# Patient Record
Sex: Male | Born: 1960
Health system: Southern US, Community
[De-identification: ages and names within clinical notes are randomized; demographics above are authoritative.]

## PROBLEM LIST (undated history)

## (undated) DIAGNOSIS — Z7901 Long term (current) use of anticoagulants: Secondary | ICD-10-CM

## (undated) DIAGNOSIS — Q269 Congenital malformation of great vein, unspecified: Secondary | ICD-10-CM

## (undated) DIAGNOSIS — I2699 Other pulmonary embolism without acute cor pulmonale: Secondary | ICD-10-CM

## (undated) DIAGNOSIS — D689 Coagulation defect, unspecified: Secondary | ICD-10-CM

## (undated) DIAGNOSIS — I82220 Acute embolism and thrombosis of inferior vena cava: Secondary | ICD-10-CM

## (undated) DIAGNOSIS — I82409 Acute embolism and thrombosis of unspecified deep veins of unspecified lower extremity: Secondary | ICD-10-CM

## (undated) HISTORY — DX: Acute embolism and thrombosis of inferior vena cava: I82.220

## (undated) HISTORY — DX: Long term (current) use of anticoagulants: Z79.01

## (undated) HISTORY — DX: Acute embolism and thrombosis of unspecified deep veins of unspecified lower extremity: I82.409

## (undated) HISTORY — DX: Coagulation defect, unspecified: D68.9

## (undated) HISTORY — DX: Other pulmonary embolism without acute cor pulmonale: I26.99

## (undated) HISTORY — DX: Congenital malformation of great vein, unspecified: Q26.9

---

## 2001-02-26 ENCOUNTER — Emergency Department (HOSPITAL_COMMUNITY): Admission: EM | Admit: 2001-02-26 | Discharge: 2001-02-26 | Payer: Self-pay | Admitting: Emergency Medicine

## 2002-04-01 ENCOUNTER — Emergency Department (HOSPITAL_COMMUNITY): Admission: EM | Admit: 2002-04-01 | Discharge: 2002-04-01 | Payer: Self-pay | Admitting: Emergency Medicine

## 2003-04-25 ENCOUNTER — Emergency Department (HOSPITAL_COMMUNITY): Admission: EM | Admit: 2003-04-25 | Discharge: 2003-04-25 | Payer: Self-pay | Admitting: Emergency Medicine

## 2003-07-22 ENCOUNTER — Encounter: Payer: Self-pay | Admitting: *Deleted

## 2003-07-22 ENCOUNTER — Emergency Department (HOSPITAL_COMMUNITY): Admission: EM | Admit: 2003-07-22 | Discharge: 2003-07-22 | Payer: Self-pay | Admitting: Emergency Medicine

## 2003-07-23 ENCOUNTER — Ambulatory Visit (HOSPITAL_COMMUNITY): Admission: RE | Admit: 2003-07-23 | Discharge: 2003-07-23 | Payer: Self-pay | Admitting: *Deleted

## 2003-07-23 ENCOUNTER — Encounter: Payer: Self-pay | Admitting: *Deleted

## 2003-07-23 ENCOUNTER — Encounter: Payer: Self-pay | Admitting: Emergency Medicine

## 2003-07-23 ENCOUNTER — Inpatient Hospital Stay (HOSPITAL_COMMUNITY): Admission: EM | Admit: 2003-07-23 | Discharge: 2003-07-30 | Payer: Self-pay | Admitting: Emergency Medicine

## 2003-07-24 ENCOUNTER — Encounter: Payer: Self-pay | Admitting: Family Medicine

## 2003-07-31 ENCOUNTER — Encounter (HOSPITAL_COMMUNITY): Admission: RE | Admit: 2003-07-31 | Discharge: 2003-08-03 | Payer: Self-pay | Admitting: Internal Medicine

## 2003-08-03 ENCOUNTER — Encounter (HOSPITAL_COMMUNITY): Admission: RE | Admit: 2003-08-03 | Discharge: 2003-09-02 | Payer: Self-pay | Admitting: Oncology

## 2003-08-03 ENCOUNTER — Encounter: Admission: RE | Admit: 2003-08-03 | Discharge: 2003-08-03 | Payer: Self-pay | Admitting: Oncology

## 2003-09-10 ENCOUNTER — Encounter: Admission: RE | Admit: 2003-09-10 | Discharge: 2003-09-10 | Payer: Self-pay | Admitting: Oncology

## 2003-09-10 ENCOUNTER — Encounter (HOSPITAL_COMMUNITY): Admission: RE | Admit: 2003-09-10 | Discharge: 2003-10-10 | Payer: Self-pay | Admitting: Oncology

## 2003-10-22 ENCOUNTER — Encounter (HOSPITAL_COMMUNITY): Admission: RE | Admit: 2003-10-22 | Discharge: 2003-11-21 | Payer: Self-pay | Admitting: Oncology

## 2003-10-22 ENCOUNTER — Encounter: Admission: RE | Admit: 2003-10-22 | Discharge: 2003-10-22 | Payer: Self-pay | Admitting: Oncology

## 2007-06-06 ENCOUNTER — Emergency Department (HOSPITAL_COMMUNITY): Admission: EM | Admit: 2007-06-06 | Discharge: 2007-06-06 | Payer: Self-pay | Admitting: Emergency Medicine

## 2008-11-27 ENCOUNTER — Inpatient Hospital Stay (HOSPITAL_COMMUNITY): Admission: EM | Admit: 2008-11-27 | Discharge: 2008-11-30 | Payer: Self-pay | Admitting: Emergency Medicine

## 2008-11-28 IMAGING — US US EXTREM LOW VENOUS BILAT
1 series · 13 of 24 positions shown · non-contrast
Comparison: None available

CLINICAL DATA: Left leg swelling and cellulitis, past history of
DVT and varicose veins

VENOUS DUPLEX ULTRASOUND OF BILATERAL LOWER EXTREMITIES
TECHNIQUE: Gray-scale sonography with graded compression, as well
as color Doppler and duplex ultrasound, were performed to evaluate
the deep venous system of both lower extremities from the level of
the common femoral vein through the popliteal and proximal calf
veins. Spectral Doppler was utilized to evaulate flow at rest and
with distal augmentation maneuvers.

[Series 1: unknown · 13 of 66 slices shown]
[im 1/66]
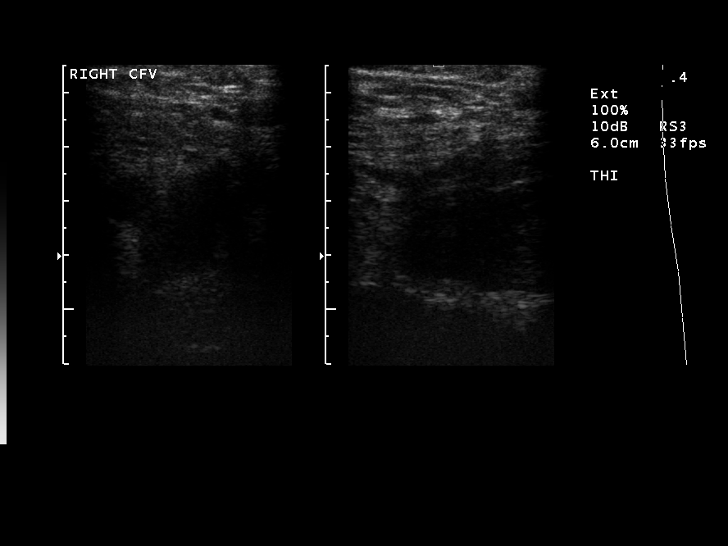
[im 6/66]
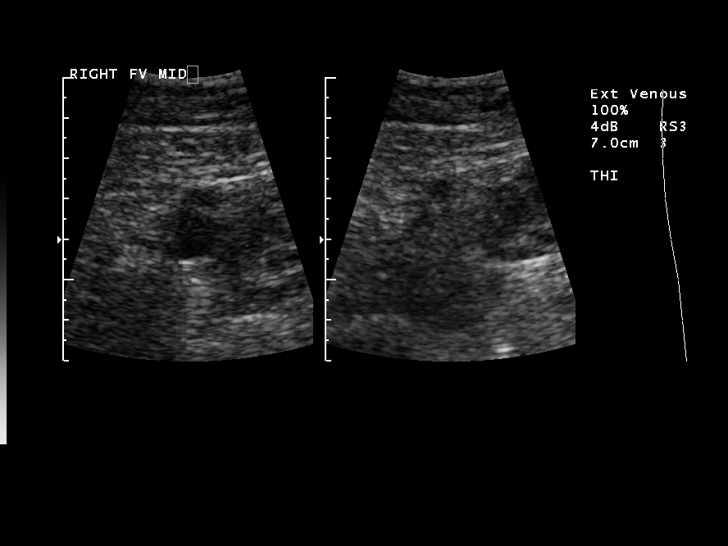
[im 12/66]
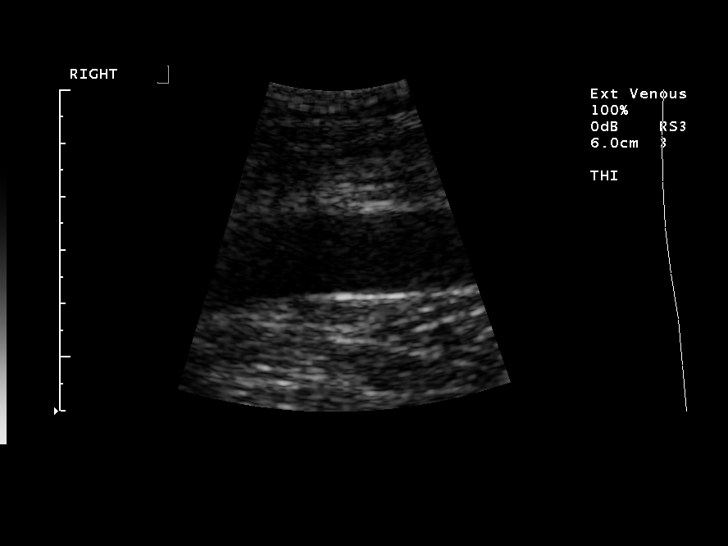
[im 17/66]
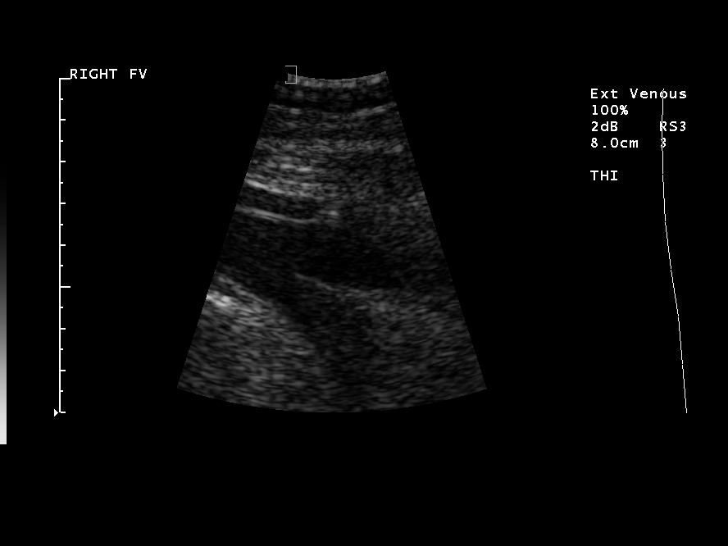
[im 23/66]
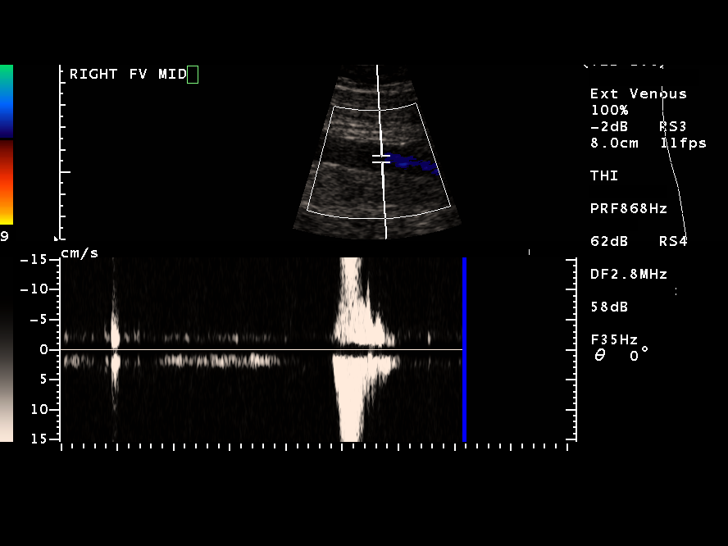
[im 29/66]
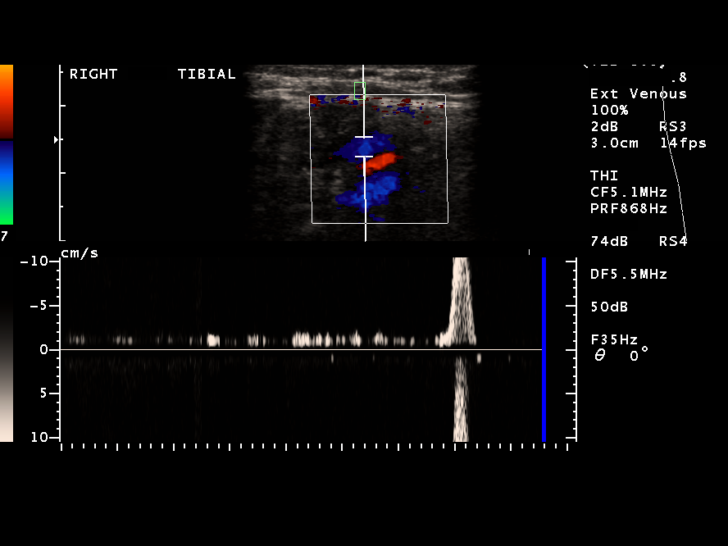
[im 34/66]
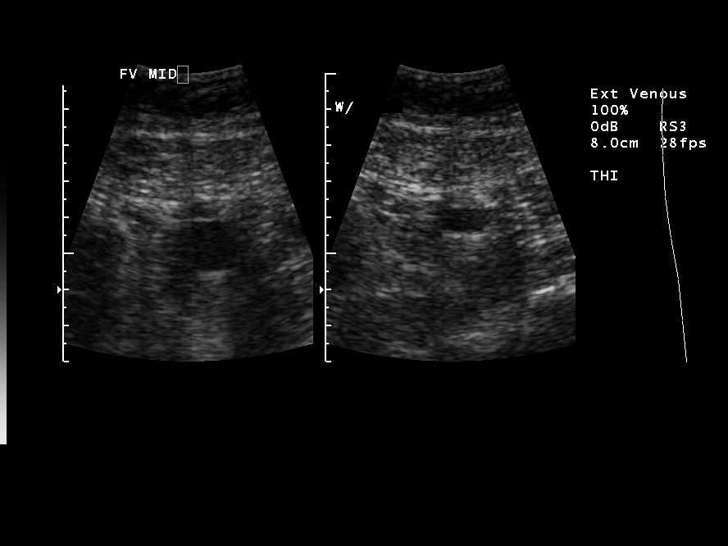
[im 37/66]
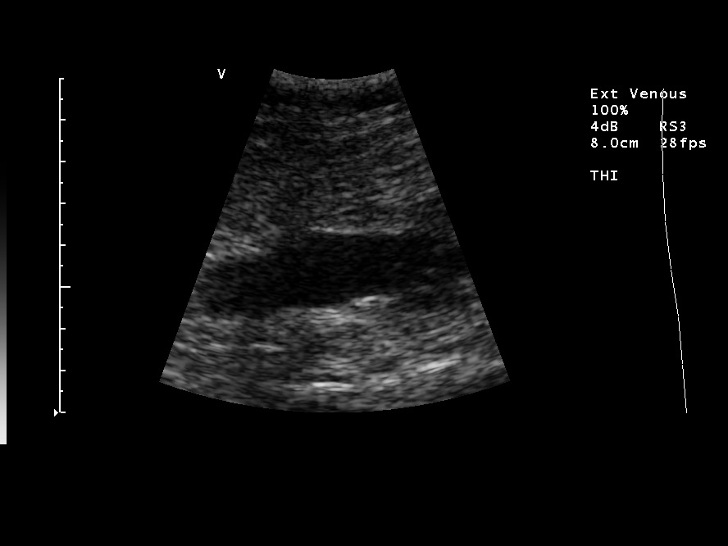
[im 43/66]
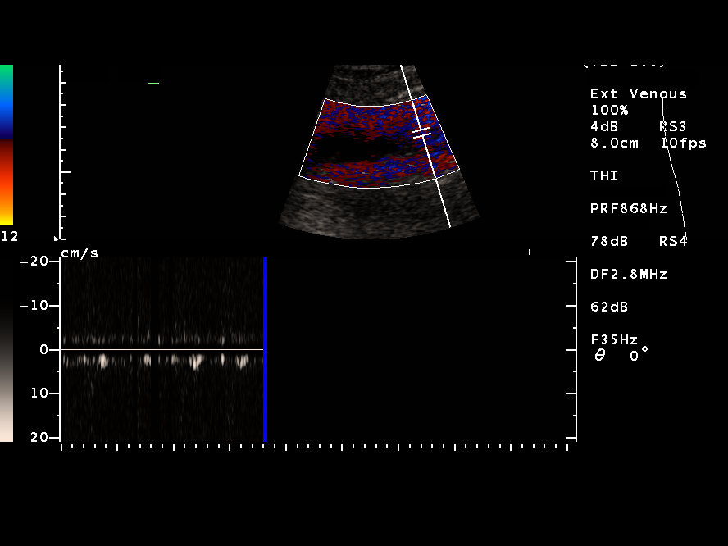
[im 49/66]
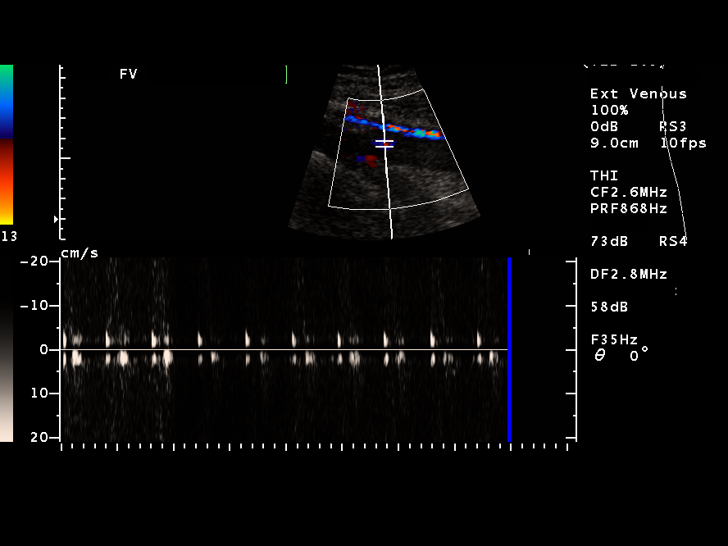
[im 54/66]
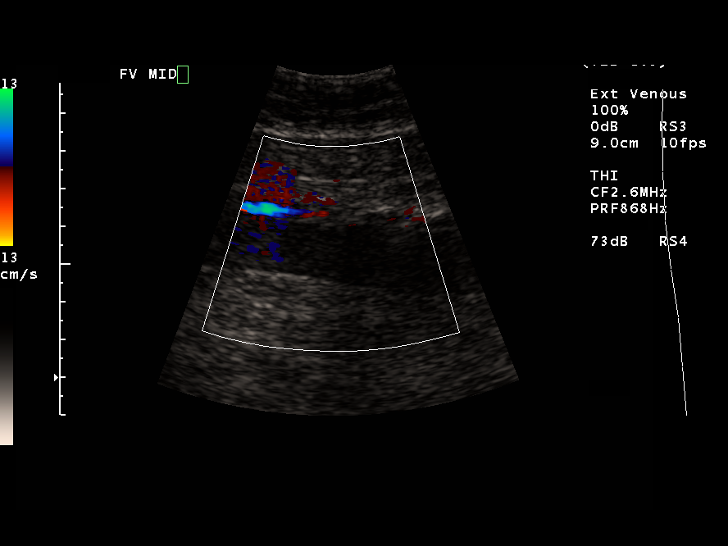
[im 60/66]
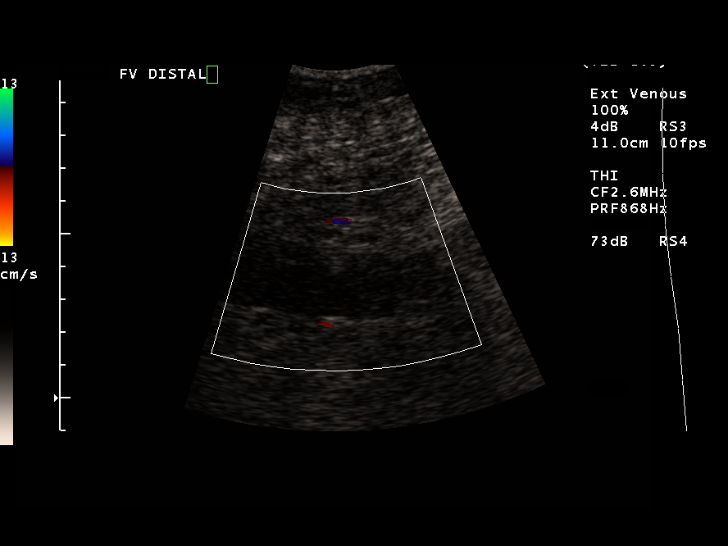
[im 66/66]
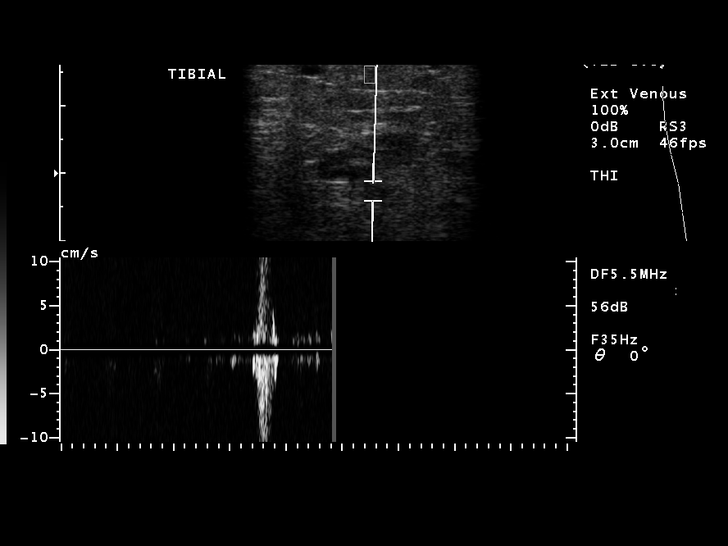

[13 of 24 positions shown; findings below may reference images not displayed]

FINDINGS: Left lower extremity:
Hypoechoic thrombus identified in deep venous system of left lower
extremity from common femoral through popliteal veins.
Observed segments demonstrate markedly diminished flow and
noncompressibility.
Augmentation maneuvers in left lower extremity not performed.
Clot also identified in left greater saphenous vein.

Right lower extremity:
Deep venous system patent and compressible from right groin through
popliteal fossa.
Spontaneous venous flow present with intact augmentation and
evidence of respiratory phasicity.
No intraluminal thrombus identified in right lower extremity.
Visualized portion of the greater saphenous system unremarkable.
IMPRESSION: Acute deep venous thrombosis in left lower extremity, from common
femoral through popliteal veins.
No evidence of deep venous thrombosis in right lower extremity.

Critical test results telephoned to Dr. CRECENCIANO at the time of
interpretation on [DATE] at [VK] hours.

## 2008-12-06 ENCOUNTER — Emergency Department (HOSPITAL_COMMUNITY): Admission: EM | Admit: 2008-12-06 | Discharge: 2008-12-06 | Payer: Self-pay | Admitting: Emergency Medicine

## 2008-12-09 ENCOUNTER — Ambulatory Visit: Payer: Self-pay | Admitting: *Deleted

## 2008-12-09 ENCOUNTER — Encounter (INDEPENDENT_AMBULATORY_CARE_PROVIDER_SITE_OTHER): Payer: Self-pay | Admitting: Emergency Medicine

## 2008-12-09 ENCOUNTER — Inpatient Hospital Stay (HOSPITAL_COMMUNITY): Admission: EM | Admit: 2008-12-09 | Discharge: 2008-12-12 | Payer: Self-pay | Admitting: Emergency Medicine

## 2009-12-02 ENCOUNTER — Ambulatory Visit (HOSPITAL_COMMUNITY): Admission: RE | Admit: 2009-12-02 | Discharge: 2009-12-02 | Payer: Self-pay | Admitting: Internal Medicine

## 2009-12-02 ENCOUNTER — Emergency Department (HOSPITAL_COMMUNITY): Admission: EM | Admit: 2009-12-02 | Discharge: 2009-12-02 | Payer: Self-pay | Admitting: Emergency Medicine

## 2009-12-02 IMAGING — US US EXTREM LOW VENOUS*L*
1 series · 14 of 24 positions shown · non-contrast
Comparison: [DATE]

CLINICAL DATA: Lower extremity edema with history of DVT.  No
longer on Coumadin.  Question DVT.



[Series 1: us extrem low venous*left* · 0.11mm/px · 14 of 33 slices shown]
[im 1/33]
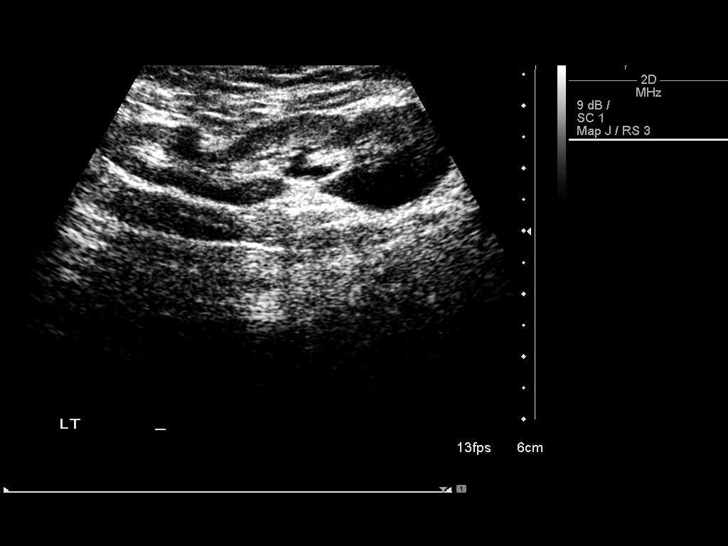
[im 3/33]
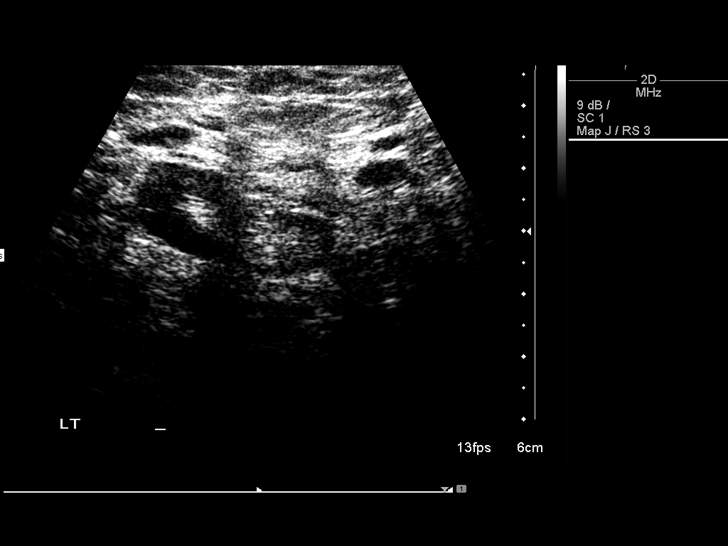
[im 6/33]
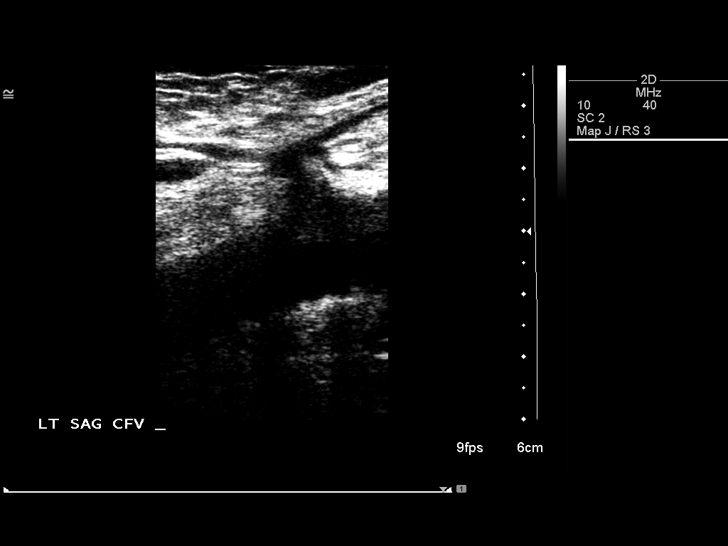
[im 9/33]
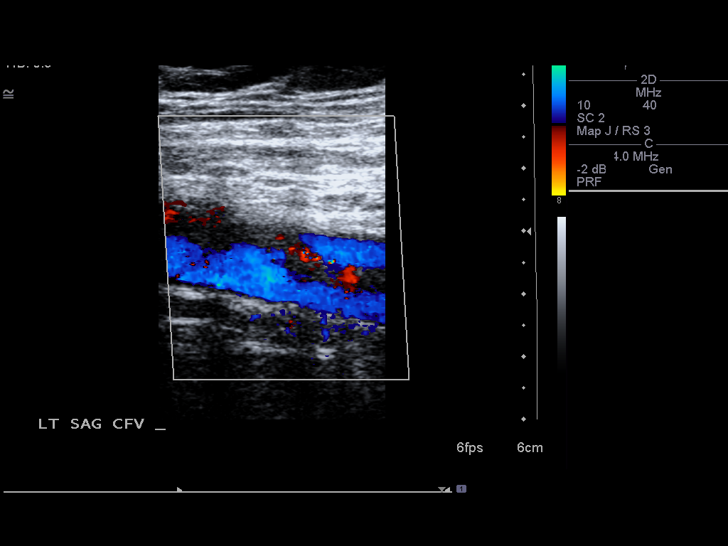
[im 10/33]
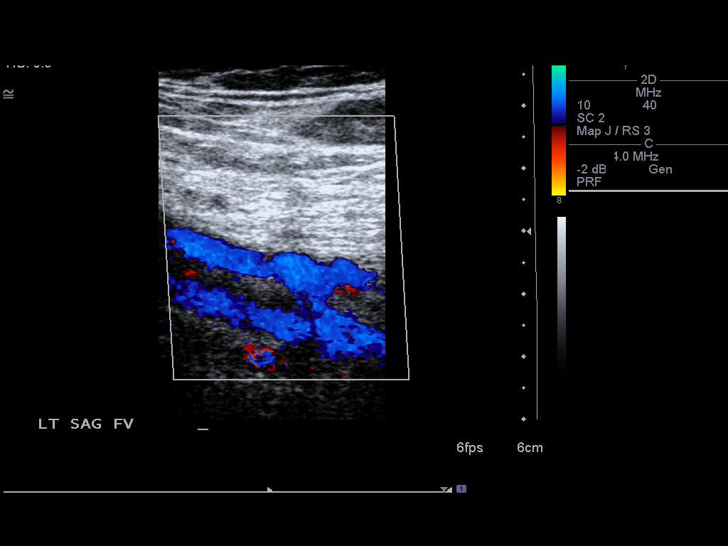
[im 13/33]
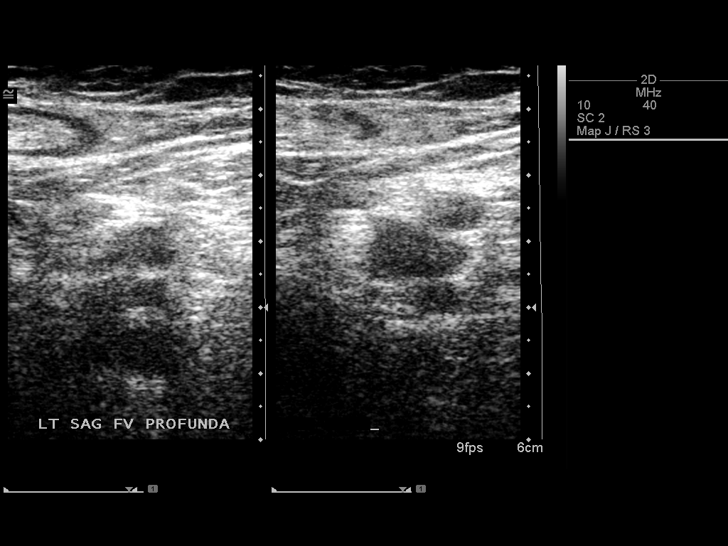
[im 16/33]
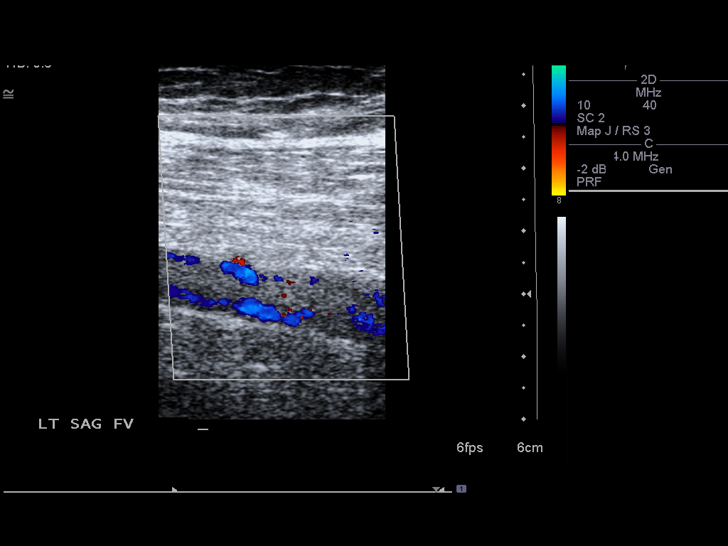
[im 17/33]
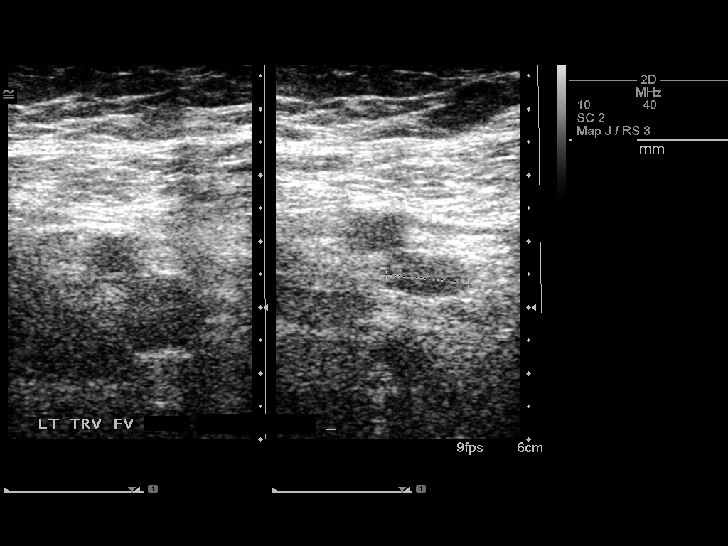
[im 20/33]
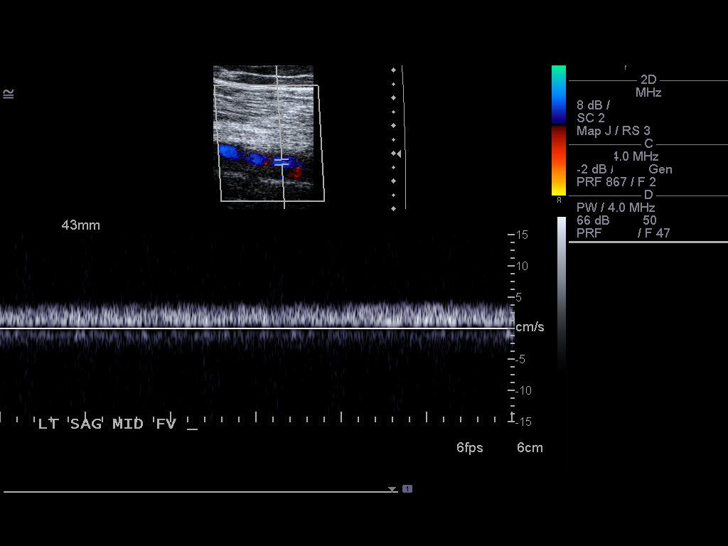
[im 23/33]
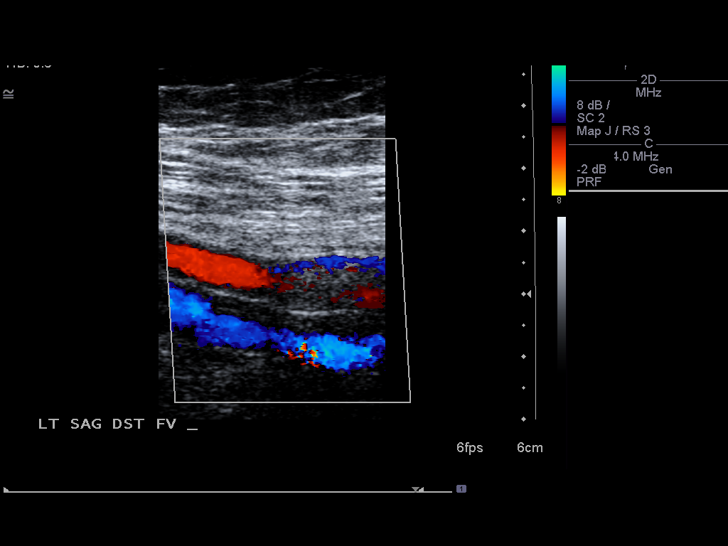
[im 26/33]
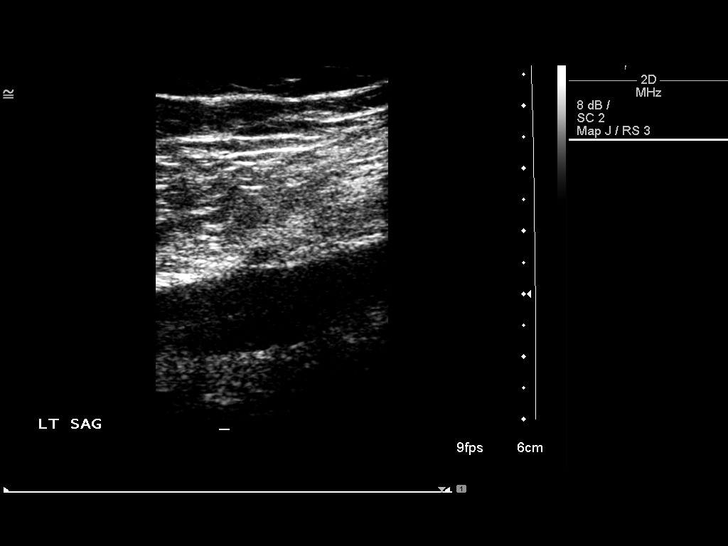
[im 27/33]
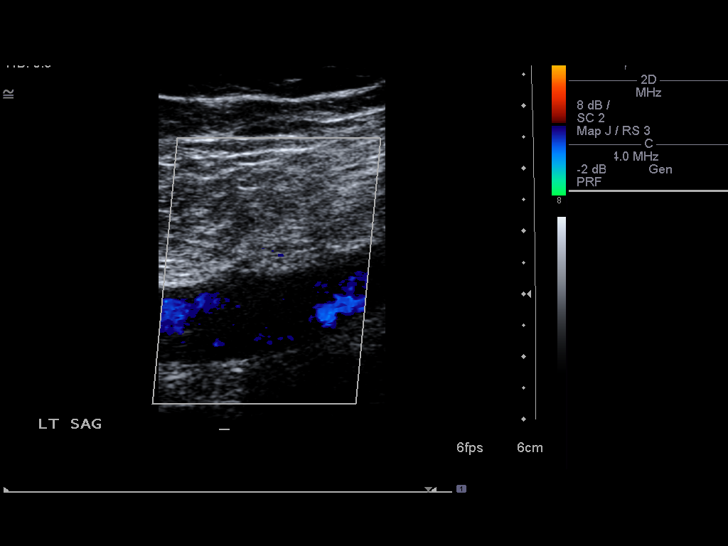
[im 30/33]
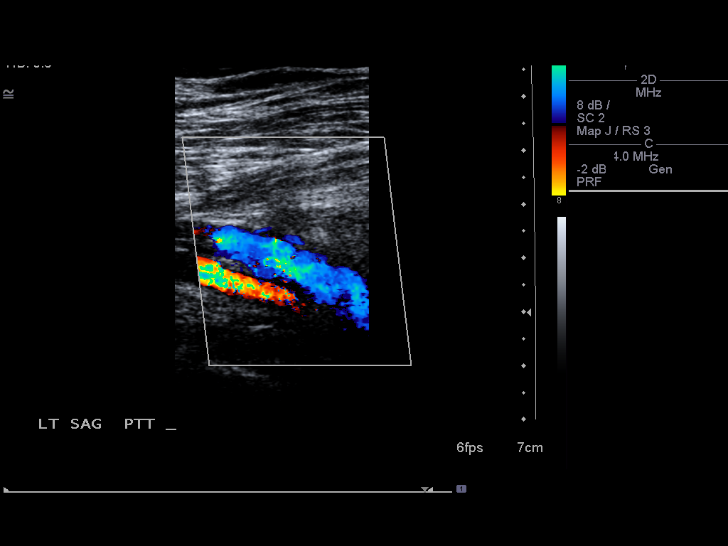
[im 33/33]
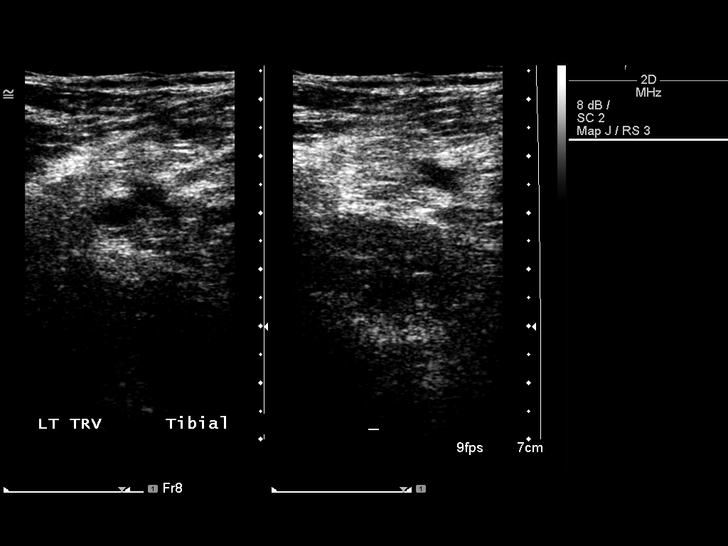

[14 of 24 positions shown; findings below may reference images not displayed]

FINDINGS: There is decreased compressibility involving the left
common femoral, profunda femoral, femoral and popliteal veins.
Lack of respiratory phasicity is seen in association.  Augmentation
was not performed.  Saphenous system is patent.
IMPRESSION: Left lower extremity deep venous thrombosis extending from the
common femoral vein to the popliteal vein.

## 2010-08-13 ENCOUNTER — Encounter (HOSPITAL_COMMUNITY): Admission: RE | Admit: 2010-08-13 | Discharge: 2010-08-29 | Payer: Self-pay | Admitting: Internal Medicine

## 2010-09-02 ENCOUNTER — Encounter (HOSPITAL_COMMUNITY): Admission: RE | Admit: 2010-09-02 | Discharge: 2010-10-02 | Payer: Self-pay | Admitting: Internal Medicine

## 2010-10-08 ENCOUNTER — Encounter (HOSPITAL_COMMUNITY)
Admission: RE | Admit: 2010-10-08 | Discharge: 2010-11-07 | Payer: Self-pay | Source: Home / Self Care | Attending: Internal Medicine | Admitting: Internal Medicine

## 2011-02-15 LAB — DIFFERENTIAL
Basophils Absolute: 0 10*3/uL (ref 0.0–0.1)
Lymphocytes Relative: 35 % (ref 12–46)
Lymphs Abs: 1.9 10*3/uL (ref 0.7–4.0)
Monocytes Absolute: 0.7 10*3/uL (ref 0.1–1.0)
Neutro Abs: 2.8 10*3/uL (ref 1.7–7.7)

## 2011-02-15 LAB — CBC
HCT: 51.7 % (ref 39.0–52.0)
Hemoglobin: 17.4 g/dL — ABNORMAL HIGH (ref 13.0–17.0)
RBC: 5.74 MIL/uL (ref 4.22–5.81)
RDW: 13.6 % (ref 11.5–15.5)
WBC: 5.5 10*3/uL (ref 4.0–10.5)

## 2011-02-15 LAB — BASIC METABOLIC PANEL
BUN: 8 mg/dL (ref 6–23)
Chloride: 100 mEq/L (ref 96–112)
GFR calc non Af Amer: >60 mL/min (ref >60)
Potassium: 3.8 mEq/L (ref 3.5–5.1)
Sodium: 136 mEq/L (ref 135–145)

## 2011-03-16 LAB — COMPREHENSIVE METABOLIC PANEL
AST: 20 U/L (ref 0–37)
Albumin: 3.4 g/dL — ABNORMAL LOW (ref 3.5–5.2)
Alkaline Phosphatase: 67 U/L (ref 39–117)
Chloride: 101 mEq/L (ref 96–112)
Creatinine, Ser: 1.05 mg/dL (ref 0.4–1.5)
Potassium: 3.3 mEq/L — ABNORMAL LOW (ref 3.5–5.1)
Sodium: 136 mEq/L (ref 135–145)
Total Bilirubin: 1.3 mg/dL — ABNORMAL HIGH (ref 0.3–1.2)

## 2011-03-16 LAB — DIFFERENTIAL
Basophils Absolute: 0 10*3/uL (ref 0.0–0.1)
Basophils Relative: 1 % (ref 0–1)
Eosinophils Absolute: 0 10*3/uL (ref 0.0–0.7)
Lymphs Abs: 1.1 10*3/uL (ref 0.7–4.0)
Monocytes Absolute: 0.8 10*3/uL (ref 0.1–1.0)
Monocytes Relative: 10 % (ref 3–12)
Neutro Abs: 2.5 10*3/uL (ref 1.7–7.7)
Neutro Abs: 6.6 10*3/uL (ref 1.7–7.7)
Neutrophils Relative %: 44 % (ref 43–77)
Neutrophils Relative %: 77 % (ref 43–77)

## 2011-03-16 LAB — RETICULOCYTES
RBC.: 5.46 MIL/uL (ref 4.22–5.81)
Retic Count, Absolute: 54.6 10*3/uL (ref 19.0–186.0)

## 2011-03-16 LAB — CBC
HCT: 46.2 % (ref 39.0–52.0)
Hemoglobin: 15.5 g/dL (ref 13.0–17.0)
Hemoglobin: 17.1 g/dL — ABNORMAL HIGH (ref 13.0–17.0)
MCHC: 33.6 g/dL (ref 30.0–36.0)
MCV: 87.5 fL (ref 78.0–100.0)
MCV: 88.5 fL (ref 78.0–100.0)
Platelets: 177 10*3/uL (ref 150–400)
RBC: 5.28 MIL/uL (ref 4.22–5.81)
RBC: 5.49 MIL/uL (ref 4.22–5.81)
RBC: 5.84 MIL/uL — ABNORMAL HIGH (ref 4.22–5.81)
RDW: 13.4 % (ref 11.5–15.5)
WBC: 7.4 10*3/uL (ref 4.0–10.5)
WBC: 8.6 10*3/uL (ref 4.0–10.5)

## 2011-03-16 LAB — PROTIME-INR
INR: 1 (ref 0.00–1.49)
INR: 1.8 — ABNORMAL HIGH (ref 0.00–1.49)
INR: 1.8 — ABNORMAL HIGH (ref 0.00–1.49)
INR: 2 — ABNORMAL HIGH (ref 0.00–1.49)
Prothrombin Time: 13.3 seconds (ref 11.6–15.2)
Prothrombin Time: 22 seconds — ABNORMAL HIGH (ref 11.6–15.2)

## 2011-03-16 LAB — URINALYSIS, ROUTINE W REFLEX MICROSCOPIC
Bilirubin Urine: NEGATIVE
Nitrite: NEGATIVE
Specific Gravity, Urine: 1.017 (ref 1.005–1.030)
pH: 5.5 (ref 5.0–8.0)

## 2011-03-16 LAB — BASIC METABOLIC PANEL
BUN: 10 mg/dL (ref 6–23)
Calcium: 9.1 mg/dL (ref 8.4–10.5)
Creatinine, Ser: 1.02 mg/dL (ref 0.4–1.5)
GFR calc non Af Amer: >60 mL/min (ref >60)
Glucose, Bld: 91 mg/dL (ref 70–99)
Sodium: 138 mEq/L (ref 135–145)

## 2011-03-16 LAB — POCT I-STAT, CHEM 8
BUN: 8 mg/dL (ref 6–23)
Chloride: 104 mEq/L (ref 96–112)
Sodium: 139 mEq/L (ref 135–145)

## 2011-03-16 LAB — APTT

## 2011-03-17 ENCOUNTER — Emergency Department (HOSPITAL_COMMUNITY): Payer: Self-pay

## 2011-03-17 ENCOUNTER — Encounter (HOSPITAL_COMMUNITY): Payer: Self-pay | Admitting: Radiology

## 2011-03-17 ENCOUNTER — Inpatient Hospital Stay (HOSPITAL_COMMUNITY)
Admission: EM | Admit: 2011-03-17 | Discharge: 2011-03-20 | DRG: 299 | Disposition: A | Discharge status: Home / Self Care | Payer: Self-pay | Attending: Otolaryngology | Admitting: Otolaryngology

## 2011-03-17 ENCOUNTER — Inpatient Hospital Stay (HOSPITAL_COMMUNITY): Payer: Self-pay

## 2011-03-17 DIAGNOSIS — I2699 Other pulmonary embolism without acute cor pulmonale: Secondary | ICD-10-CM | POA: Diagnosis present

## 2011-03-17 DIAGNOSIS — I82220 Acute embolism and thrombosis of inferior vena cava: Secondary | ICD-10-CM | POA: Diagnosis present

## 2011-03-17 DIAGNOSIS — I824Y9 Acute embolism and thrombosis of unspecified deep veins of unspecified proximal lower extremity: Principal | ICD-10-CM | POA: Diagnosis present

## 2011-03-17 DIAGNOSIS — E039 Hypothyroidism, unspecified: Secondary | ICD-10-CM | POA: Diagnosis present

## 2011-03-17 LAB — CBC
MCH: 30.5 pg (ref 26.0–34.0)
MCHC: 34.8 g/dL (ref 30.0–36.0)
MCV: 87.8 fL (ref 78.0–100.0)
Platelets: 122 10*3/uL — ABNORMAL LOW (ref 150–400)
RBC: 5.57 MIL/uL (ref 4.22–5.81)
RDW: 14.1 % (ref 11.5–15.5)

## 2011-03-17 LAB — DIFFERENTIAL
Basophils Relative: 0 % (ref 0–1)
Eosinophils Absolute: 0 10*3/uL (ref 0.0–0.7)
Eosinophils Relative: 0 % (ref 0–5)
Lymphs Abs: 1.1 10*3/uL (ref 0.7–4.0)
Monocytes Absolute: 0.7 10*3/uL (ref 0.1–1.0)
Monocytes Relative: 8 % (ref 3–12)

## 2011-03-17 LAB — BASIC METABOLIC PANEL
BUN: 11 mg/dL (ref 6–23)
Calcium: 9.4 mg/dL (ref 8.4–10.5)
Chloride: 101 mEq/L (ref 96–112)
Creatinine, Ser: 1.16 mg/dL (ref 0.4–1.5)

## 2011-03-17 LAB — PROTIME-INR: Prothrombin Time: 13.9 seconds (ref 11.6–15.2)

## 2011-03-17 IMAGING — US US EXTREM LOW VENOUS*R*
1 series · 13 of 24 positions shown · non-contrast
Comparison: None

CLINICAL DATA: Swelling right lower extremity, pain, history DVT

RIGHT LOWER EXTREMITY VENOUS DUPLEX ULTRASOUND
TECHNIQUE: Gray-scale sonography with graded compression, as well
as color Doppler and duplex ultrasound, were performed to evaluate
the deep venous system of the lower extremity from the level of the
common femoral vein through the popliteal and proximal calf veins.
Spectral Doppler was utilized to evaluate flow at rest and with
distal augmentation maneuvers.

[Series 1: us extrem low venous*right* · 0.11mm/px · 13 of 28 slices shown]
[im 1/28]
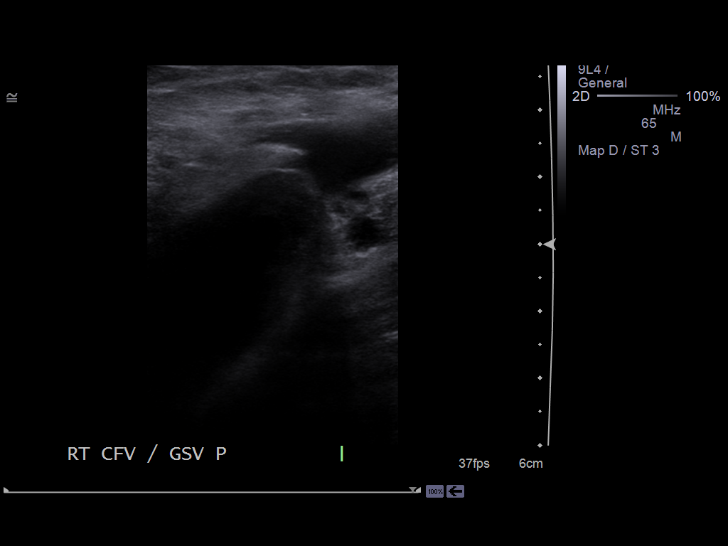
[im 3/28]
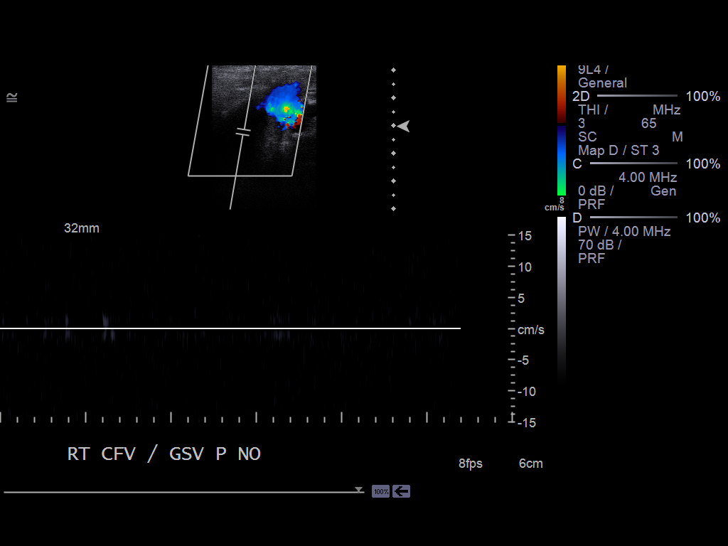
[im 5/28]
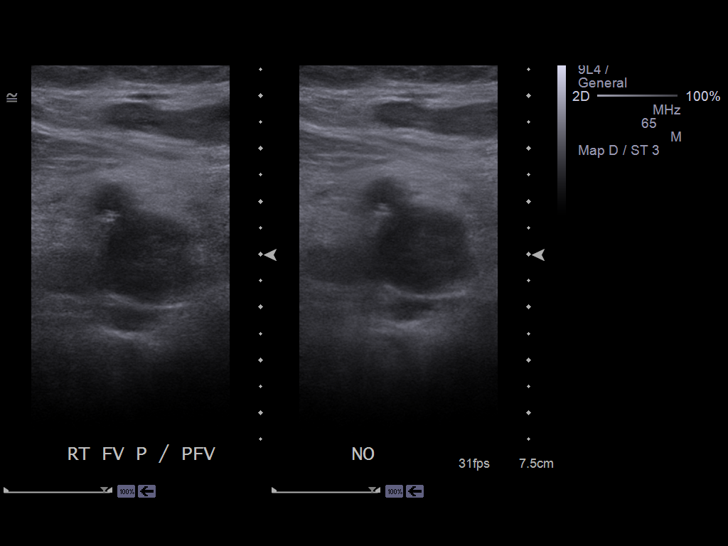
[im 8/28]
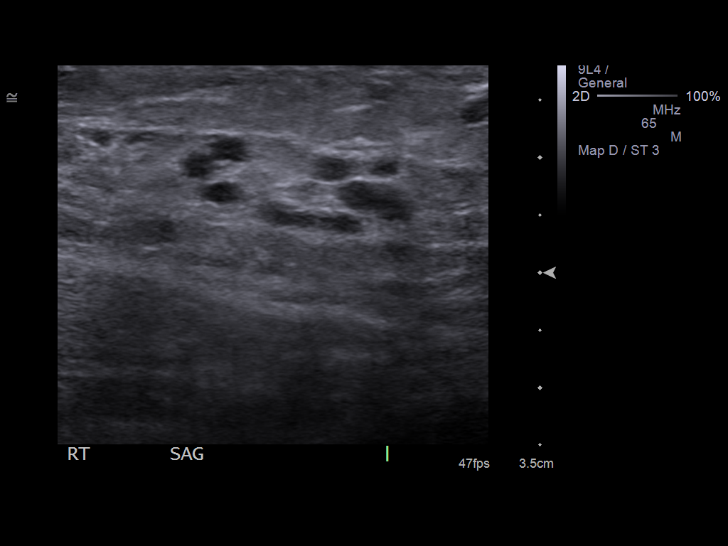
[im 10/28]
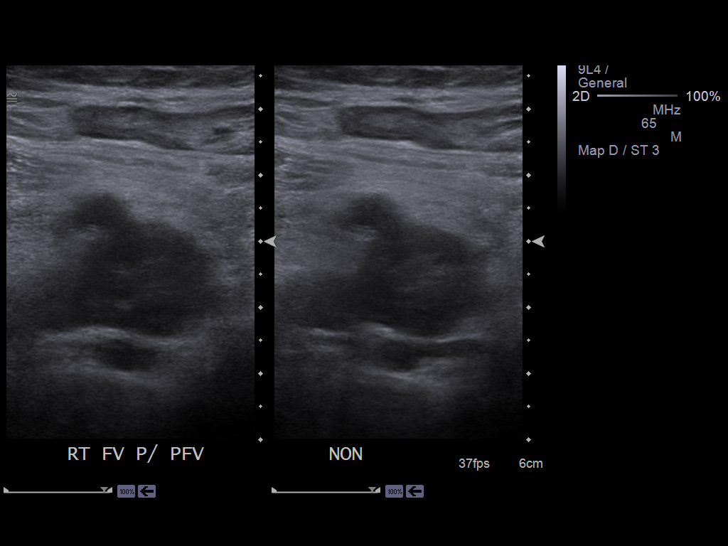
[im 12/28]
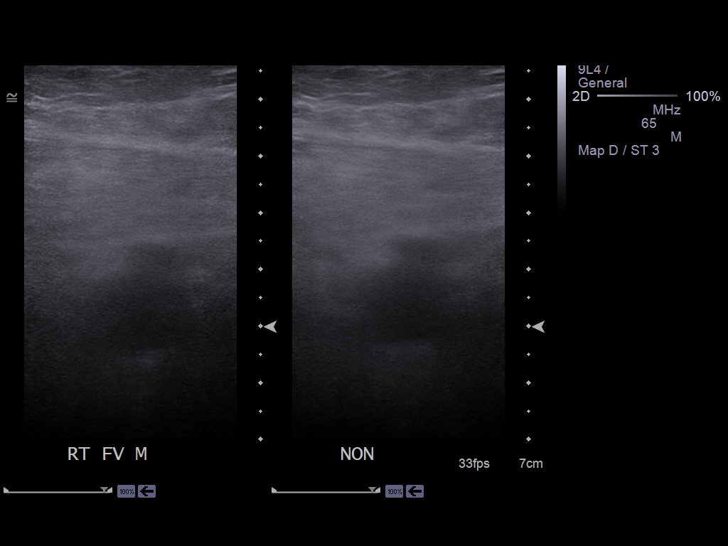
[im 15/28]
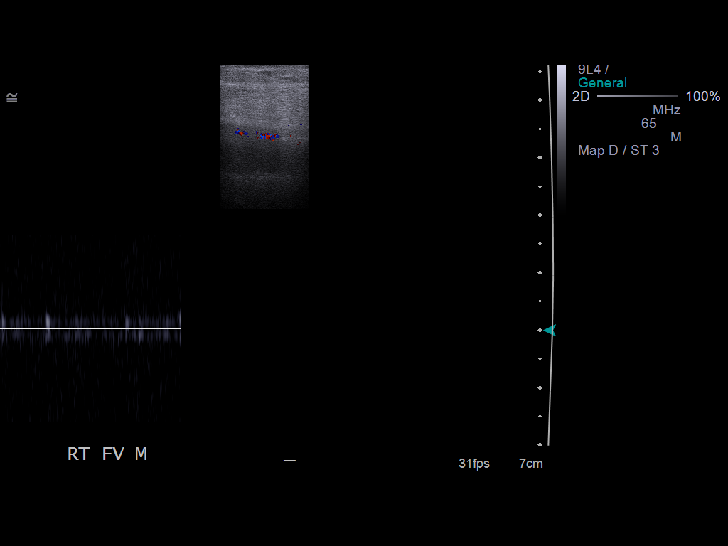
[im 16/28]
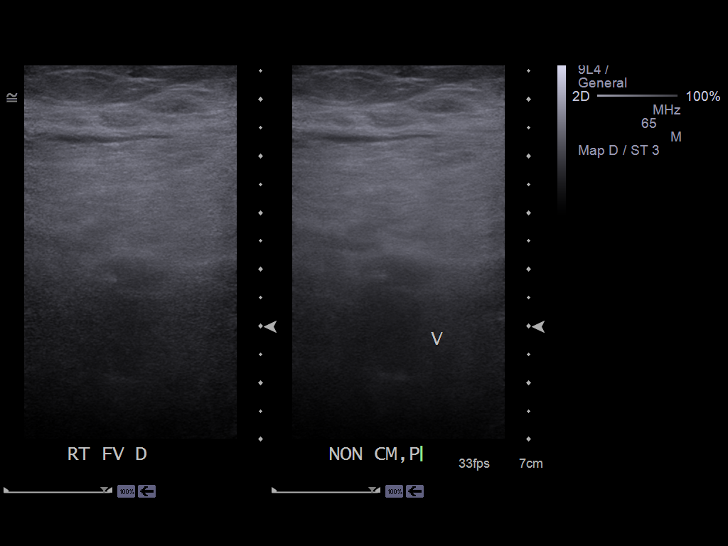
[im 18/28]
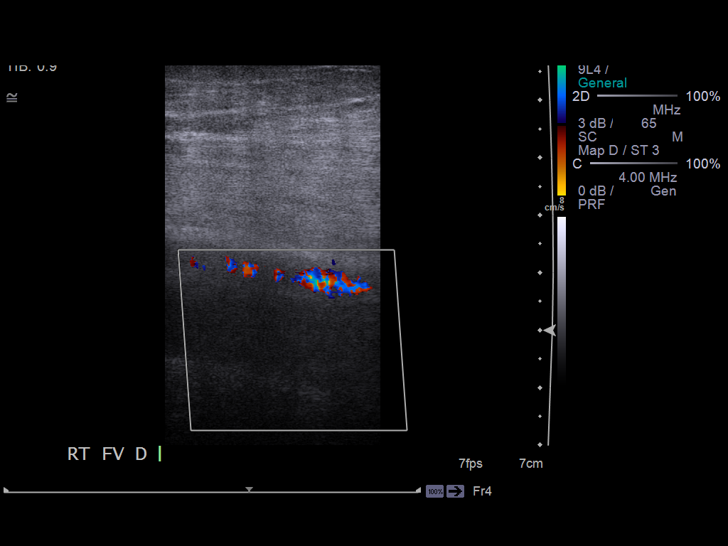
[im 20/28]
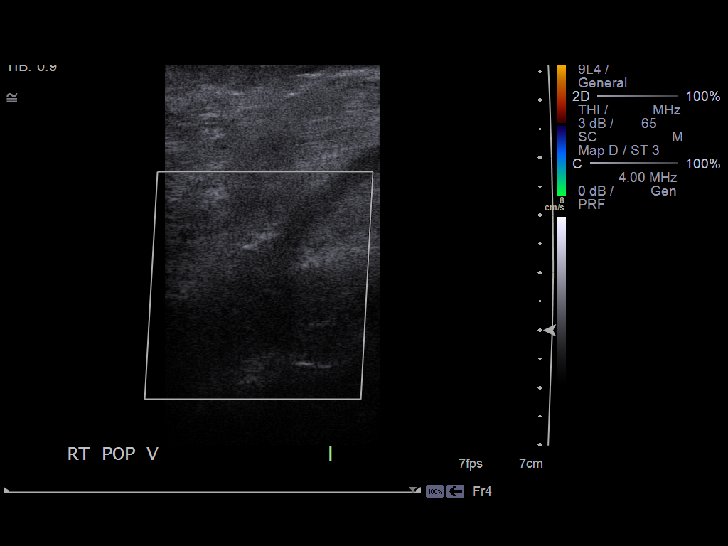
[im 23/28]
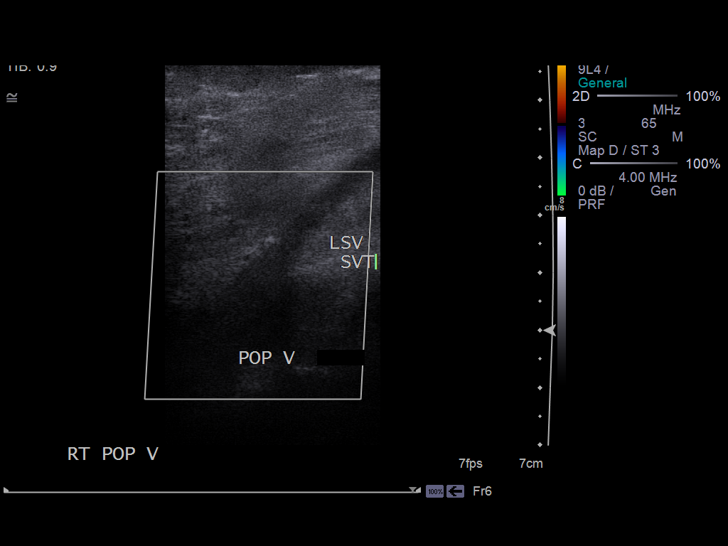
[im 25/28]
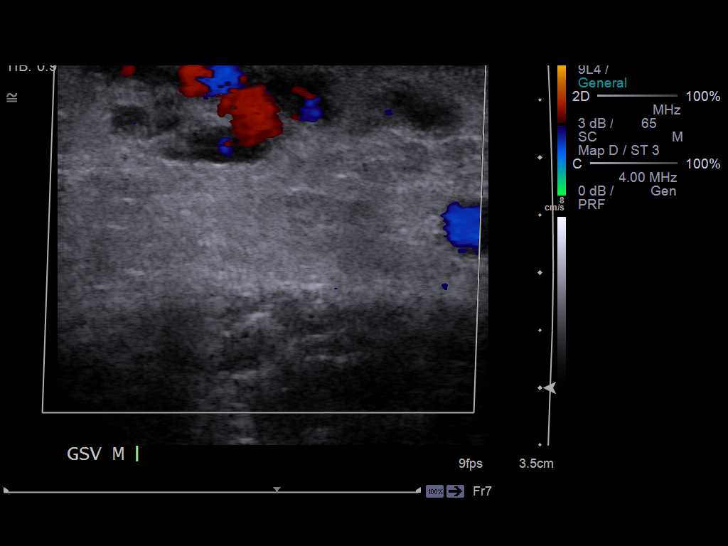
[im 28/28]
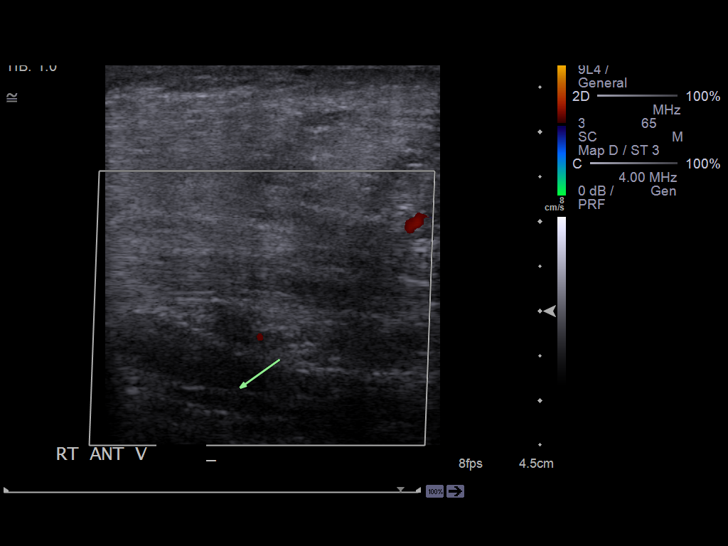

[13 of 24 positions shown; findings below may reference images not displayed]

FINDINGS: Extensive thrombus is identified throughout the deep venous system
of the right lower extremity, extending from the right common
femoral vein through the right popliteal vein.  Involved segments
demonstrate hypoechoic internal thrombus, are mildly distended, and
are noncompressible. Only very minimal spontaneous venous flow is
seen at scattered sites throughout the deep venous system of the
right lower extremity.  Augmentation maneuvers not performed.

Numerous superficial venous varicosities are identified throughout
the right lower extremity, some which demonstrate age indeterminate
thrombus. Additionally, a large varix is seen extending through a
minimally prominent lymph node in the right inguinal region.
IMPRESSION: Extensive deep venous thrombosis throughout the right lower
extremity.
Numerous venous varicosities, some which contain age indeterminate
thrombus.
Incidentally noted large varicosity traversing the hilum of a lymph
node at the right inguinal region.

Critical test results telephoned to Dr. JOSE LUIS at the time of
interpretation on [DATE] at [CT] hours.

## 2011-03-17 IMAGING — CT CT ABD-PELV W/ CM
2 of 5 series · 15 of 46 positions shown, 17 images · IV contrast (Omnipaque 300)
Comparison: Prior lower extremity ultrasound of [DATE] and
[DATE].  No prior CTs.  Report of abdominal pelvic CT of
[DATE] is also reviewed.

CLINICAL DATA: Question right inguinal mass.  History of multiple
deep venous thromboses.

CT ABDOMEN AND PELVIS WITH CONTRAST
TECHNIQUE: Multidetector CT imaging of the abdomen and pelvis was
performed following the standard protocol during bolus
administration of intravenous contrast.
Contrast: 100  ml [AR]

[Series 2: abd_pel_with 5.0 b40f · axial · 0.84mm/px · z∈[-482,-27]mm · 12 of 103 slices shown, 14 images]
[im 6/103  soft-tissue]
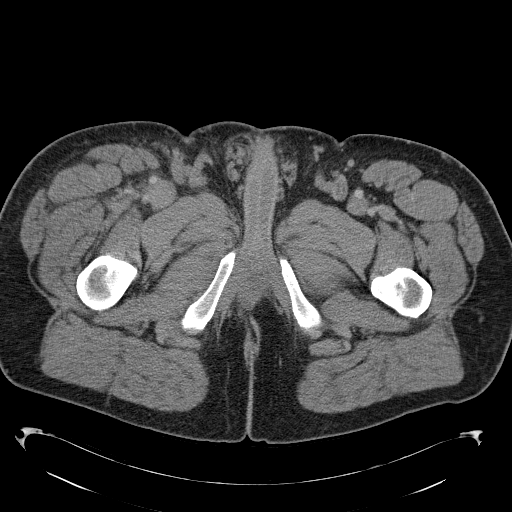
[im 6/103  bone]
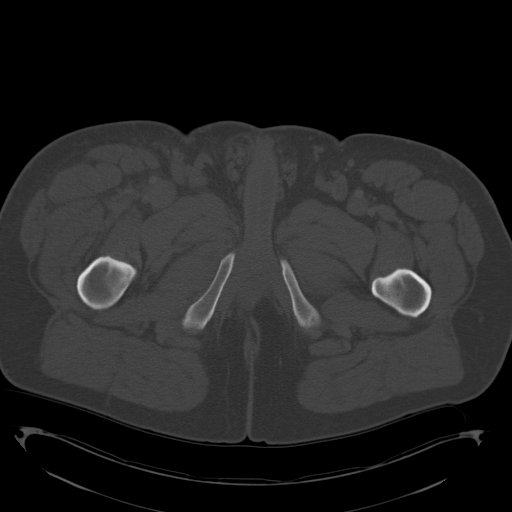
[im 18/103  soft-tissue]
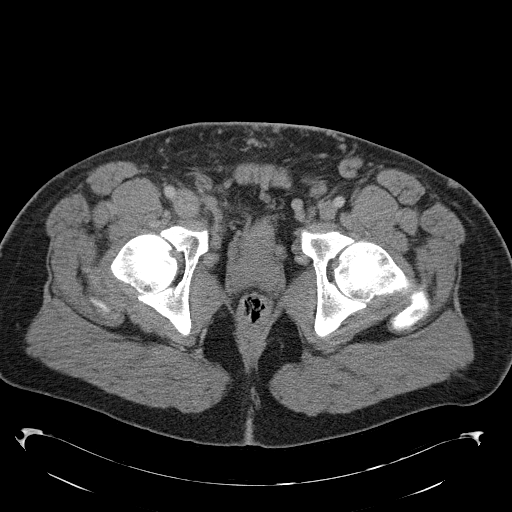
[im 23/103  soft-tissue]
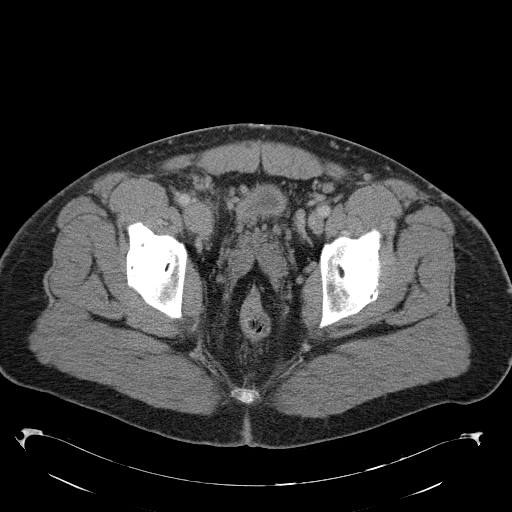
[im 29/103  soft-tissue]
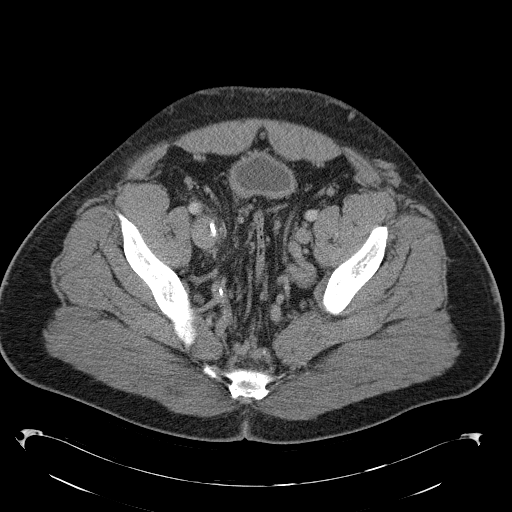
[im 40/103  soft-tissue]
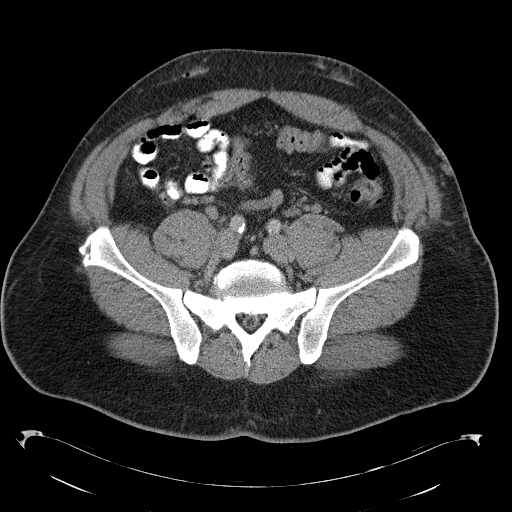
[im 46/103  soft-tissue]
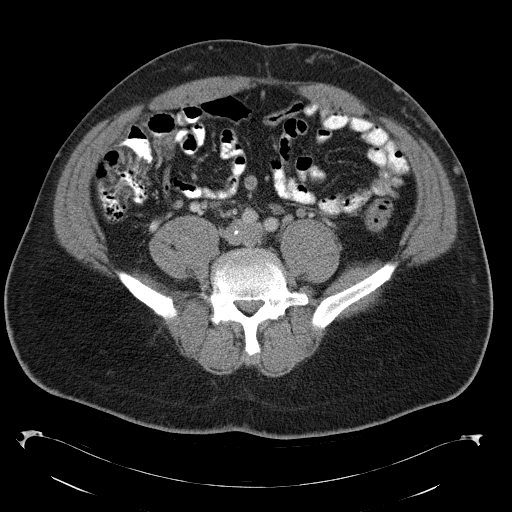
[im 57/103  soft-tissue]
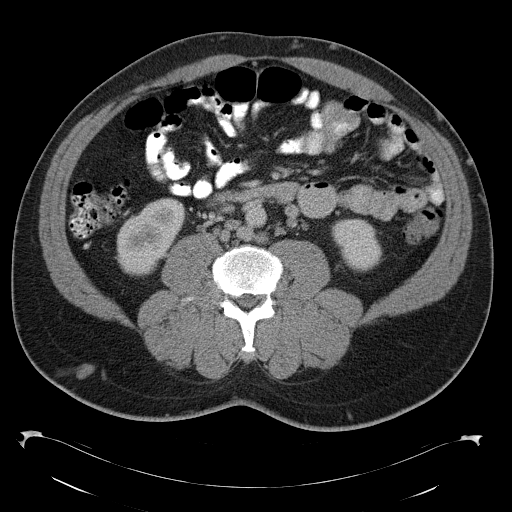
[im 63/103  soft-tissue]
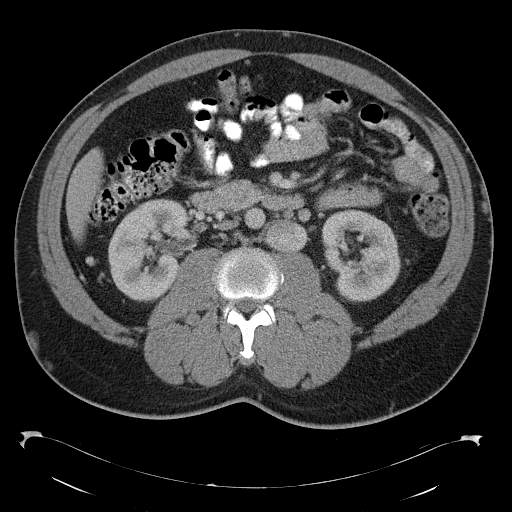
[im 74/103  soft-tissue]
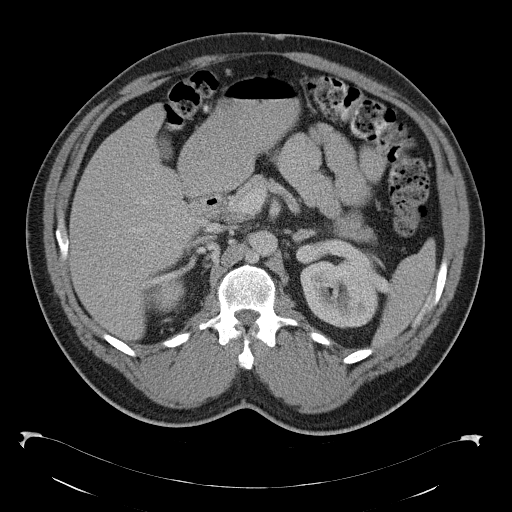
[im 74/103  bone]
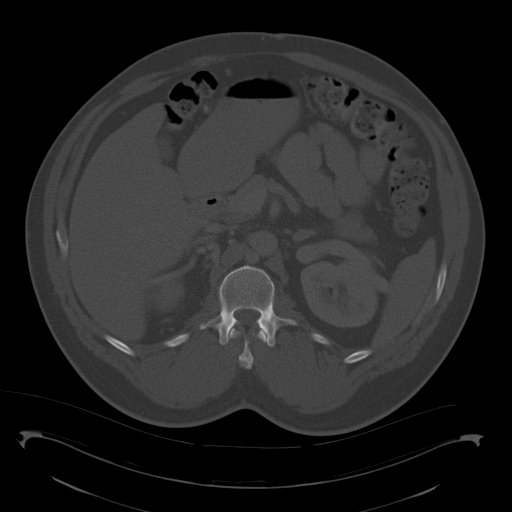
[im 80/103  soft-tissue]
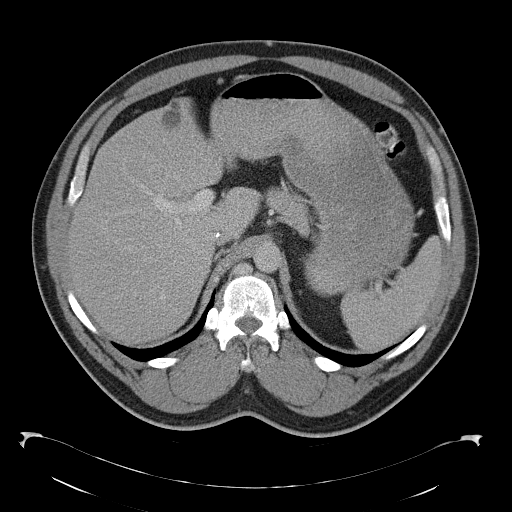
[im 86/103  soft-tissue]
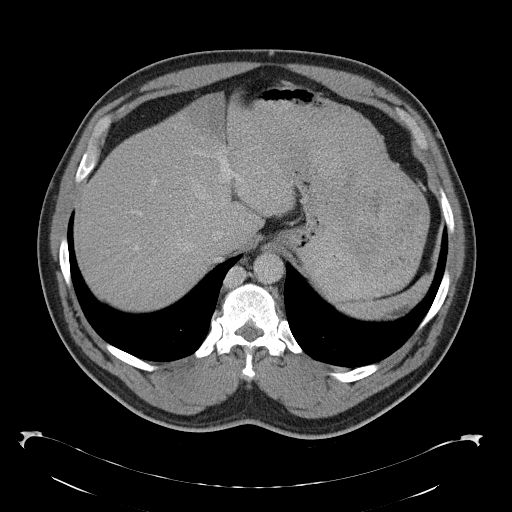
[im 97/103  soft-tissue]
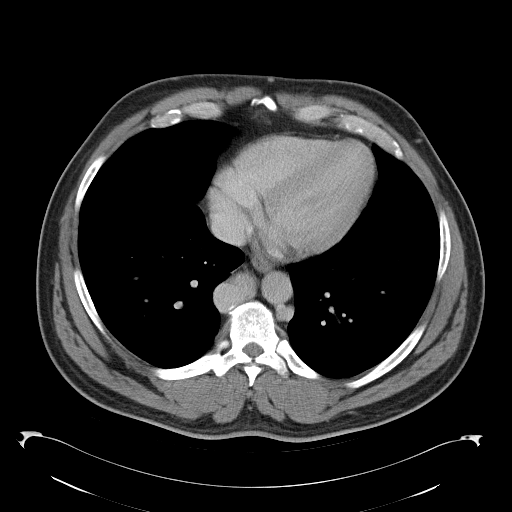

[Series 4: abd_pel_with 3.0 spo cor · coronal · 0.80mm/px · 3 of 107 slices shown]
[im 36/107  soft-tissue]
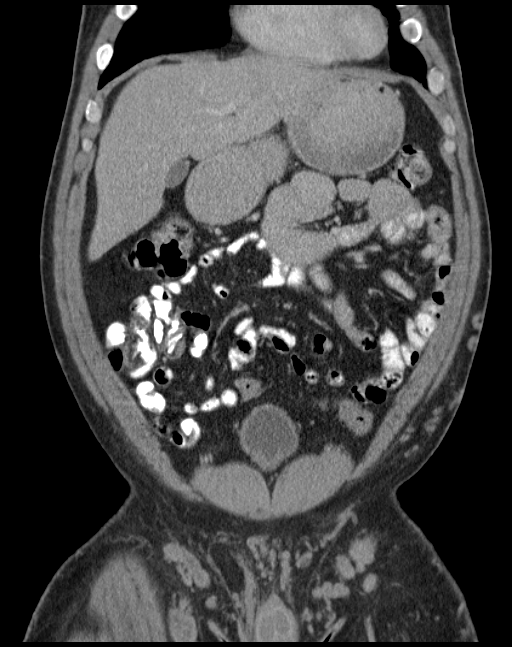
[im 48/107  soft-tissue]
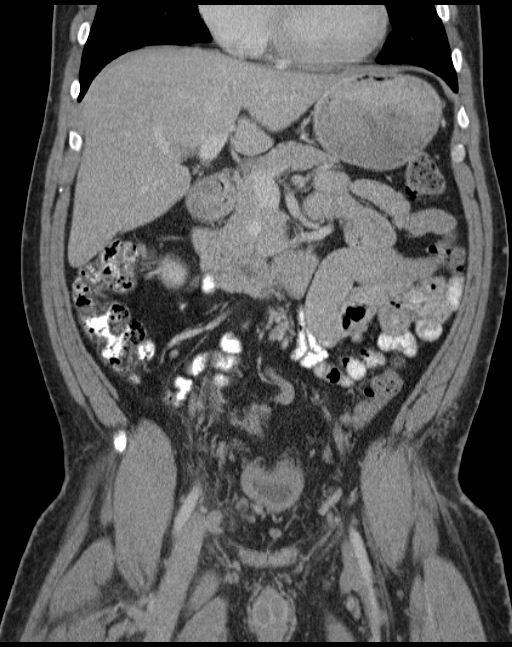
[im 59/107  soft-tissue]
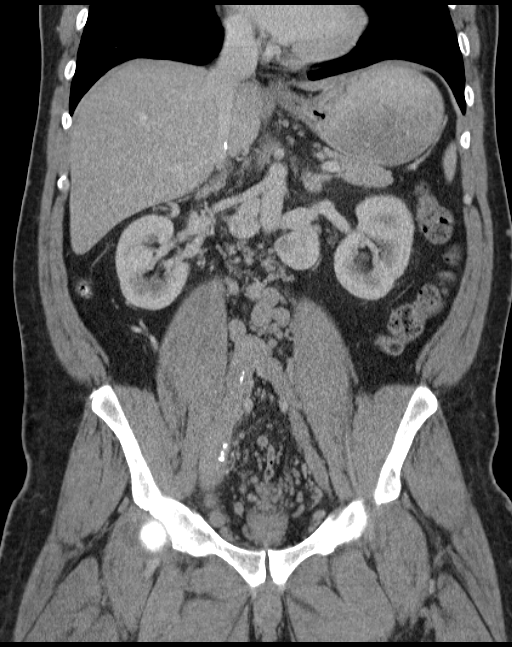

[15 of 46 positions shown; findings below may reference images not displayed]

FINDINGS: Clear lung bases.  Normal heart size without pericardial
or pleural effusion.  Markedly dilated azygos/hemiazygous veins.

There is mild hepatic steatosis.  More focal steatosis adjacent the
falciform ligament.  Normal spleen, stomach,.  Apparent
hyperenhancement within the pancreatic uncinate process on image 37
felt to be due to surrounding collateral vessels.  This measures
1.1 cm.

Normal gallbladder, biliary tract, adrenal glands,.  Interpolar
right renal cyst.  Normal left kidney.

No retroperitoneal adenopathy.

There is chronic occlusion of the IVC throughout its course.
Multiple enlarged collateral vessels within the retroperitoneum and
adjacent the pancreas and spleen.  There is calcification within
the intrahepatic IVC on image 24, consistent with chronic
occlusion.

Sigmoid diverticulosis. Colonic stool burden suggests constipation.

Normal appendix.
Normal small bowel without abdominal ascites.

No definite pelvic adenopathy.  There is extensive abnormal soft
tissue within the pelvic sidewalls and inguinal regions
bilaterally.  This is felt to be secondary to chronic
Venous thrombosis and collateral vessel formation.  There is
chronic occlusion of the right common iliac and external iliac vein
with calcifications identified within.  Left internal iliac
calcification also suggests chronic occlusion of the iliac
vasculature.

The right external iliac vein is dilated with minimal surrounding
edema on image 81.  Normal urinary bladder and prostate without
significant free pelvic fluid.  Age advanced bilateral hip
osteoarthritis.
IMPRESSION: 1.  Chronic occlusion of the inferior vena cava and pelvic venous
system as detailed above.  Extensive collateral vessels within the
abdomen, lower chest, and pelvis. A component suspect a component
of acute or subacute right external iliac thrombus.  2.  No
convincing evidence of adenopathy or inguinal mass.  Extensive
abnormal soft tissue within the inguinal regions and pelvic
sidewalls, felt to be due to collateral vessel formation.
3.  Hepatic steatosis.

## 2011-03-17 MED ORDER — IOHEXOL 300 MG/ML  SOLN
100.0000 mL | Freq: Once | INTRAMUSCULAR | Status: AC | PRN
  Administered 2011-03-17: 100 mL via INTRAVENOUS

## 2011-03-18 LAB — PROTIME-INR
INR: 1.06 (ref 0.00–1.49)
Prothrombin Time: 14 seconds (ref 11.6–15.2)

## 2011-03-18 LAB — COMPREHENSIVE METABOLIC PANEL
ALT: 23 U/L (ref 0–53)
AST: 27 U/L (ref 0–37)
Albumin: 3.7 g/dL (ref 3.5–5.2)
CO2: 26 mEq/L (ref 19–32)
Calcium: 8.7 mg/dL (ref 8.4–10.5)
Creatinine, Ser: 1.03 mg/dL (ref 0.4–1.5)
GFR calc Af Amer: >60 mL/min (ref >60)
GFR calc non Af Amer: >60 mL/min (ref >60)
Sodium: 135 mEq/L (ref 135–145)
Total Protein: 7.1 g/dL (ref 6.0–8.3)

## 2011-03-18 LAB — DIFFERENTIAL
Basophils Relative: 0 % (ref 0–1)
Eosinophils Relative: 3 % (ref 0–5)
Monocytes Absolute: 0.7 10*3/uL (ref 0.1–1.0)
Monocytes Relative: 10 % (ref 3–12)
Neutro Abs: 3.6 10*3/uL (ref 1.7–7.7)

## 2011-03-18 LAB — CBC
HCT: 47.5 % (ref 39.0–52.0)
Hemoglobin: 16.6 g/dL (ref 13.0–17.0)
MCH: 30.5 pg (ref 26.0–34.0)
MCHC: 34.9 g/dL (ref 30.0–36.0)

## 2011-03-18 NOTE — H&P (Signed)
NAME:  Matthew Wise, GAMEL               ACCOUNT NO.:  [id number]  MEDICAL RECORD NO.:  [id number]           PATIENT TYPE:  I  LOCATION:  A339                          FACILITY:  [facility]  PHYSICIAN:  Wilson Singer, M.D.DATE OF BIRTH:  Mar 31, 1961  DATE OF ADMISSION:  03/17/2011 DATE OF DISCHARGE:  LH                             HISTORY & PHYSICAL   CHIEF COMPLAINT:  Swelling of the right leg.  HISTORY OF PRESENTING ILLNESS:  This 50 year old man presents with a 24- to 36-hour history of swelling and pain in the right leg.  He came to the emergency room whereupon he had venous Doppler of the right leg, which showed that he had extensive DVT throughout the right lower extremity with numerous venous varicosities, which contained age- indeterminate thrombus.  Also there was noted large varicosity traversing the hilum of the lymph node at the right inguinal region.  He denies any dyspnea.  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  The etiology of this recurrent DVT is not entirely clear to me.  He apparently has had some testing in the way of the hypercoagulable panel, but I do not see any results of this and I do not see that he has been seen by hematologist.  He appears to be a fairly active man in his work and does not appear to be sedentary.  He is somewhat obese, however.  PAST MEDICAL HISTORY:  Recurrent DVTs as mentioned above.  MEDICATIONS:  None.  ALLERGIES:  None.  SOCIAL HISTORY:  The patient is divorced, lives with his sister.  He smokes one packet of cigarettes per day and he does according to him drink every day.  There is no history of intravenous drug abuse.  He does admit to history of drug abuse approximately 5 years ago.  FAMILY HISTORY:  Noncontributory at this stage.  REVIEW OF SYSTEMS:  Apart from the symptoms mentioned above, there are no other symptoms referable to all systems reviewed.  PHYSICAL EXAMINATION:  VITAL  SIGNS:  Temperature 97.6, blood pressure 151/92, pulse 78, saturation 98% on room air. GENERAL:  He is not clinically pale.  He looks systemically well.  He is not dyspneic at rest.  He is obese. CARDIOVASCULAR:  Heart sounds are present and normal without murmurs. LUNGS:  Lung fields are clear. ABDOMEN:  Soft and nontender with no evidence of masses especially in the pelvic area.  There is no hepatosplenomegaly. NEUROLOGIC:  He is alert and oriented without any focal neurologic signs. SKIN:  There are no obvious skin problems. EXTREMITIES:  Show clearly very enlarged right leg consistent with venous Doppler finding of DVT with superficial varicosities seen.  The left leg also appears to be somewhat enlarged and I do not know whether this is chronic or acute.  INVESTIGATIONS:  Hemoglobin 17.0, white blood cell count 9.4, platelets 122.  Sodium 136, potassium 3.8, bicarbonate 26, glucose 134, BUN 11, creatinine 1.16, INR 1.05.  PROBLEM LIST: 1. Right whole leg deep venous thrombosis. 2. History of recurrent left leg deep venous thrombosis.  PLAN:  1. Admit. 2. Start anticoagulation. 3. Check CT scan of his abdomen and pelvis. 4. Check hypercoagulable panel.  He may need a Hematology     consultation. 5. Further recommendations will depend on the patient's hospital     progress.     Wilson Singer, M.D.     NCG/MEDQ  D:  03/17/2011  T:  03/18/2011  Job:  [id number]  cc:   Madelin Rear. Sherwood Gambler, MD Fax: [phone]  Electronically Signed by Lilly Cove M.D. on 03/18/2011 02:14:02 PM

## 2011-03-19 ENCOUNTER — Inpatient Hospital Stay (HOSPITAL_COMMUNITY): Payer: Self-pay

## 2011-03-19 DIAGNOSIS — Z86718 Personal history of other venous thrombosis and embolism: Secondary | ICD-10-CM

## 2011-03-19 LAB — DIFFERENTIAL
Eosinophils Absolute: 0.1 10*3/uL (ref 0.0–0.7)
Lymphocytes Relative: 36 % (ref 12–46)
Lymphs Abs: 2.1 10*3/uL (ref 0.7–4.0)
Monocytes Relative: 12 % (ref 3–12)
Neutro Abs: 2.9 10*3/uL (ref 1.7–7.7)
Neutrophils Relative %: 50 % (ref 43–77)

## 2011-03-19 LAB — LUPUS ANTICOAGULANT PANEL: PTT Lupus Anticoagulant: 48.7 secs — ABNORMAL HIGH (ref 30.0–45.6)

## 2011-03-19 LAB — PROTIME-INR: INR: 1.05 (ref 0.00–1.49)

## 2011-03-19 LAB — PROTEIN C ACTIVITY: Protein C Activity: 123 % (ref 75–133)

## 2011-03-19 LAB — CBC
HCT: 46.7 % (ref 39.0–52.0)
Hemoglobin: 16.2 g/dL (ref 13.0–17.0)
MCH: 30.3 pg (ref 26.0–34.0)
MCV: 87.5 fL (ref 78.0–100.0)
Platelets: 138 10*3/uL — ABNORMAL LOW (ref 150–400)
RBC: 5.34 MIL/uL (ref 4.22–5.81)
WBC: 5.8 10*3/uL (ref 4.0–10.5)

## 2011-03-19 LAB — BETA-2-GLYCOPROTEIN I ABS, IGG/M/A: Beta-2-Glycoprotein I IgM: 1 M Units (ref <20)

## 2011-03-19 LAB — ANTITHROMBIN III: AntiThromb III Func: 93 % (ref 76–126)

## 2011-03-19 LAB — PROTEIN S, TOTAL: Protein S Ag, Total: 125 % (ref 60–150)

## 2011-03-19 LAB — CARDIOLIPIN ANTIBODIES, IGG, IGM, IGA: Anticardiolipin IgM: 2 MPL U/mL — ABNORMAL LOW (ref <11)

## 2011-03-19 IMAGING — CT CT ANGIO CHEST
1 of 6 series · 5 of 36 positions shown · IV contrast (Omnipaque 300)
Comparison: None

CLINICAL DATA: Bilateral lower extremity deep venous thrombosis,
question pulmonary embolism or occult malignancy

CT ANGIOGRAPHY CHEST WITH CONTRAST
TECHNIQUE: Multidetector CT imaging of the chest was performed
using the standard protocol during bolus administration of
intravenous contrast.  Multiplanar CT image reconstructions
including MIPs were obtained to evaluate the vascular anatomy.
Imaging was repeated due to inadequate opacification of the
pulmonary arterial tree in the mid-to-lower lungs due to mis-timing
of imaging versus contrast bolus.
Contrast:  120 ml Omnipaque 350 IV; repeat imaging with 80 ml
Omnipaque 350 IV

[Series 7: pe 3.0 b40f · axial · 0.69mm/px · z∈[-194,-28]mm · 5 of 83 slices shown]
[im 14/83  lung]
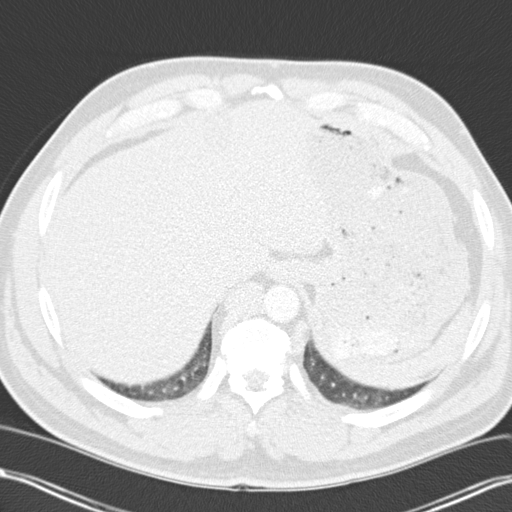
[im 28/83  mediastinal]
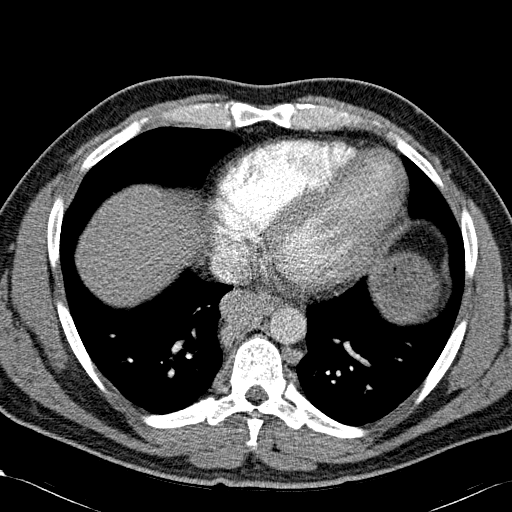
[im 42/83  lung]
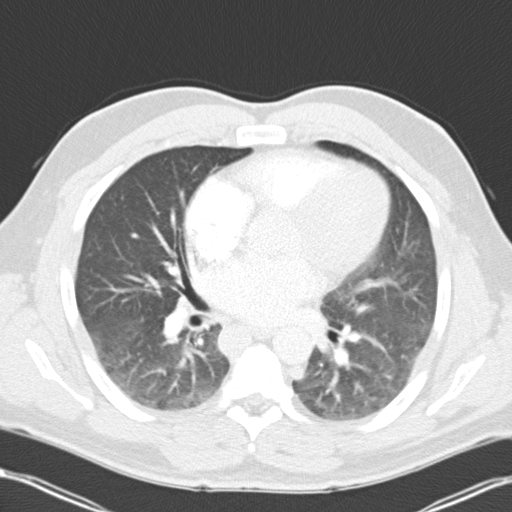
[im 55/83  mediastinal]
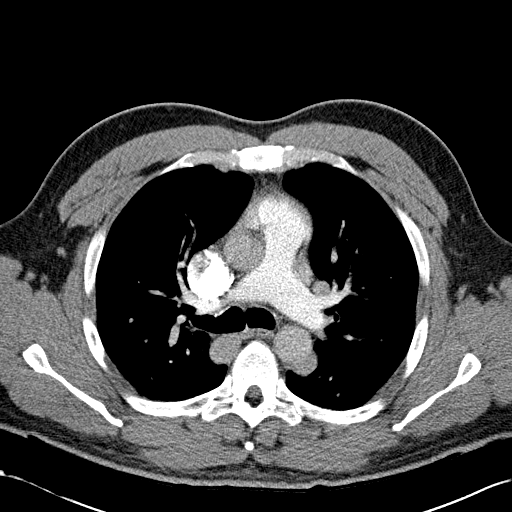
[im 69/83  lung]
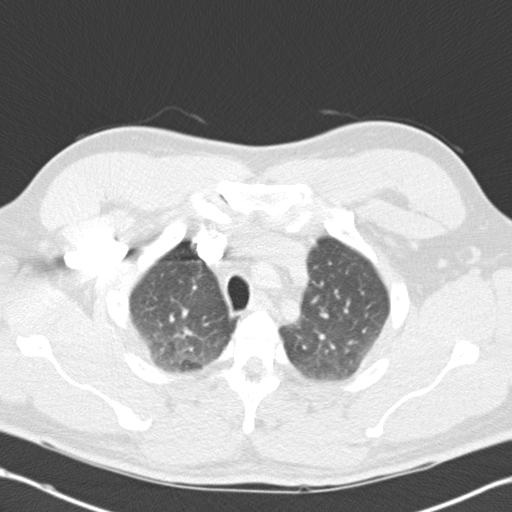

[5 of 36 positions shown; findings below may reference images not displayed]

FINDINGS: Aorta normal caliber without aneurysm or dissection.
Occlusion of the intrahepatic IVC with collateral flow through
anterior abdominal/chest wall, the azygos vein, and hemi azygos
vein.
Small filling defects identified in a right lower lobe pulmonary
artery compatible small pulmonary emboli.
Remaining deep venous system is grossly patent.
Visualized portions of upper abdomen otherwise unremarkable.
No definite thoracic adenopathy.
Mild dependent atelectasis in both lungs predominately lower lobe.
No definite pulmonary infiltrate, pleural effusion or mass.
No acute osseous findings.

Review of the MIP images confirms the above findings.
IMPRESSION: Small pulmonary emboli in right lower lobe.
Chronic occlusion of IVC with collateral flow via primarily the
azygos and hemiazygous systems.
Minimal dependent atelectasis in the lower lobes.

Findings discussed with Dr. MERLOS at time of interpretation,
[OO] hours on [DATE].

## 2011-03-19 IMAGING — US US EXTREM LOW VENOUS*L*
1 series · 14 of 24 positions shown · non-contrast
Comparison: [DATE]

CLINICAL DATA: Right lower extremity DVT, left leg pain and
swelling

LEFT LOWER EXTREMITY VENOUS DUPLEX ULTRASOUND
TECHNIQUE: Gray-scale sonography with graded compression, as well
as color Doppler and duplex ultrasound, were performed to evaluate
the deep venous system of the lower extremity from the level of the
common femoral vein through the popliteal and proximal calf veins.
Spectral Doppler was utilized to evaluate flow at rest and with
distal augmentation maneuvers.

[Series 1: us extrem low venous*left* · 14 of 36 slices shown]
[im 1/36]
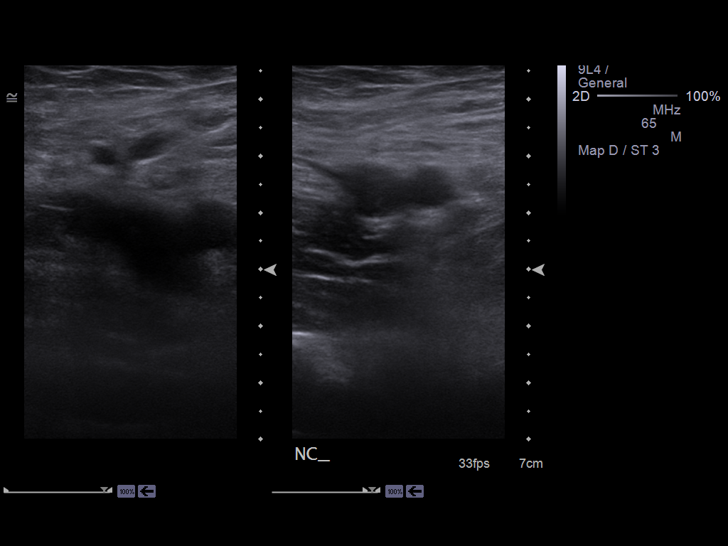
[im 4/36]
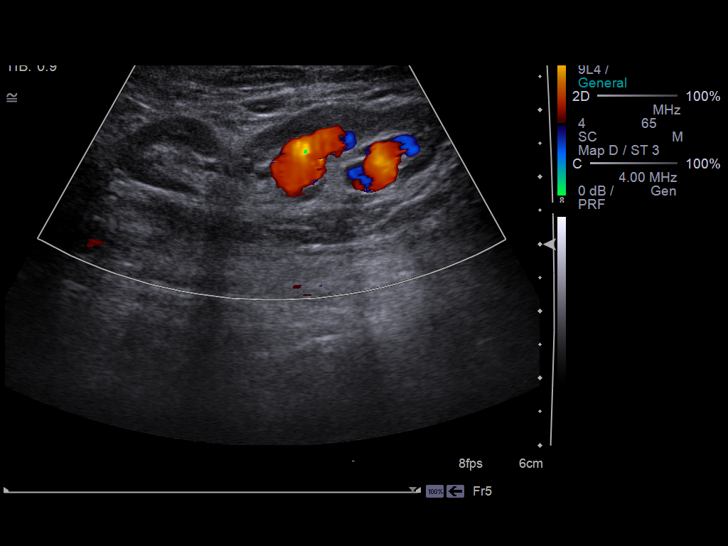
[im 7/36]
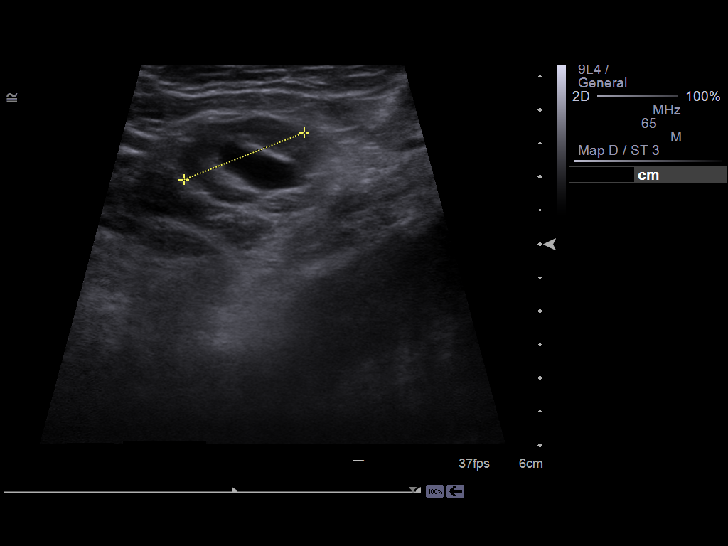
[im 10/36]
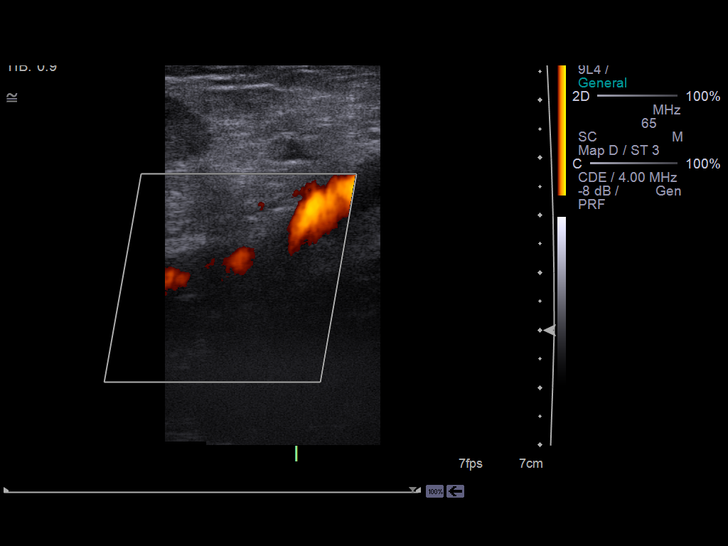
[im 11/36]
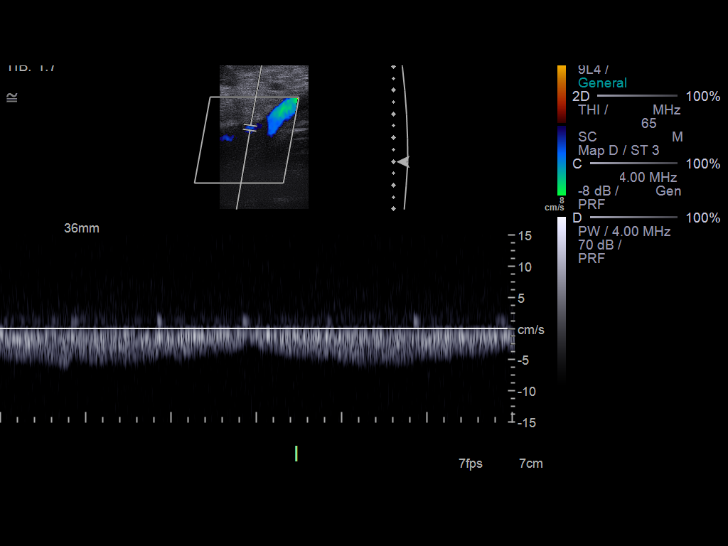
[im 14/36]
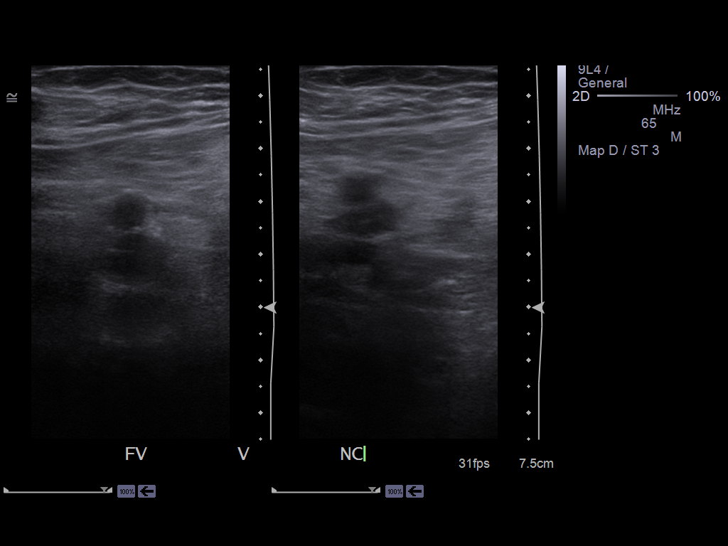
[im 17/36]
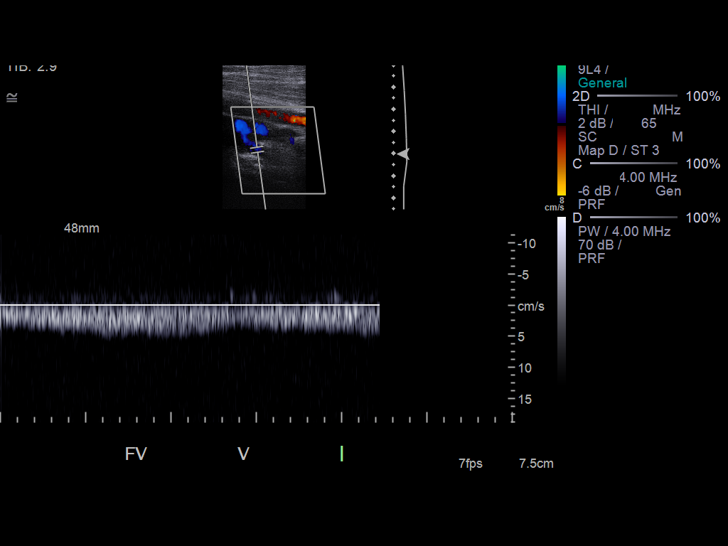
[im 19/36]
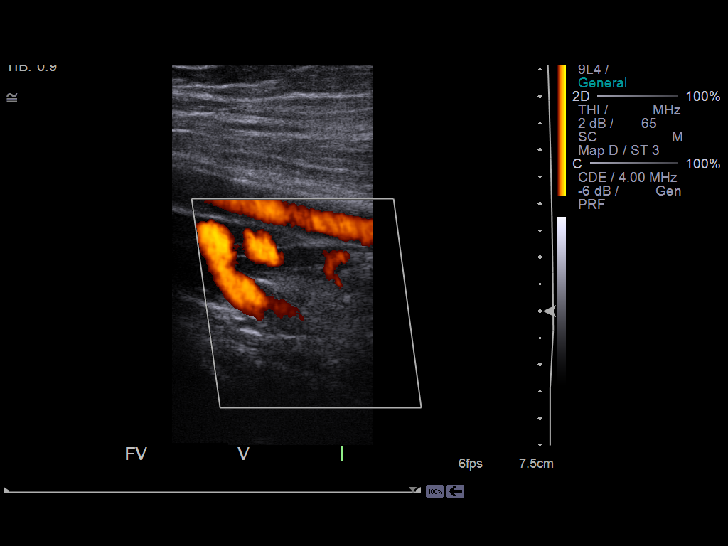
[im 22/36]
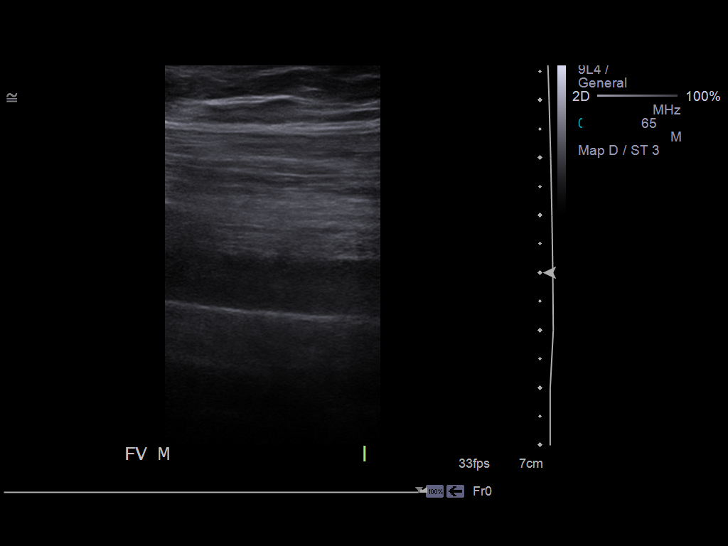
[im 25/36]
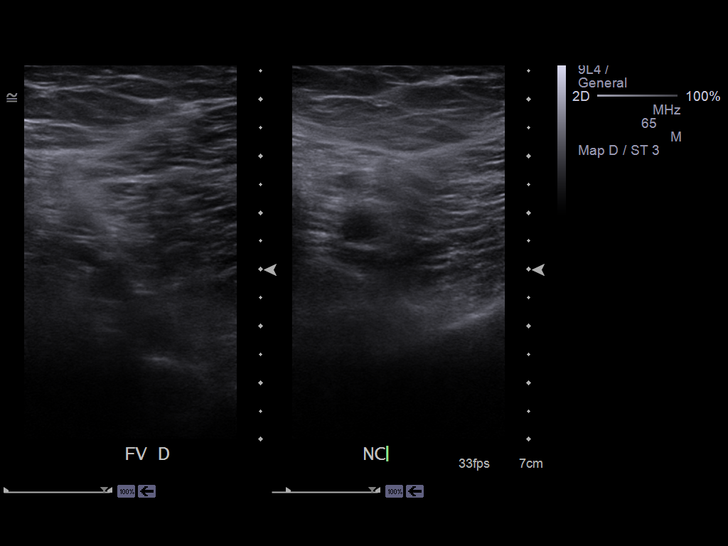
[im 28/36]
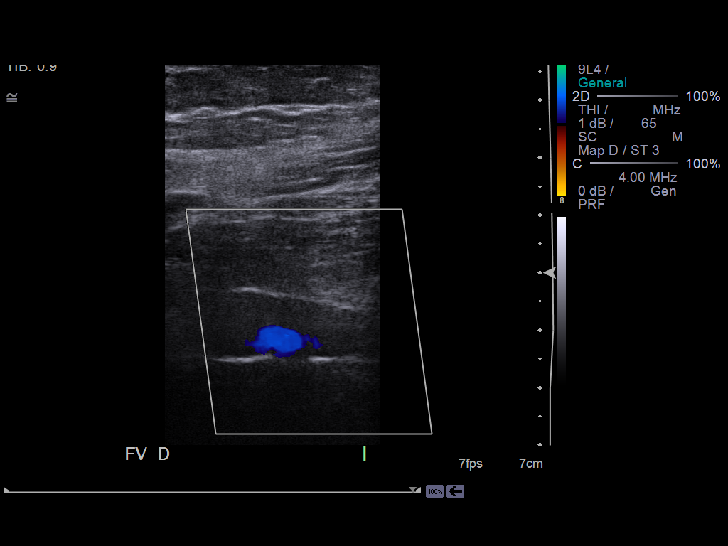
[im 29/36]
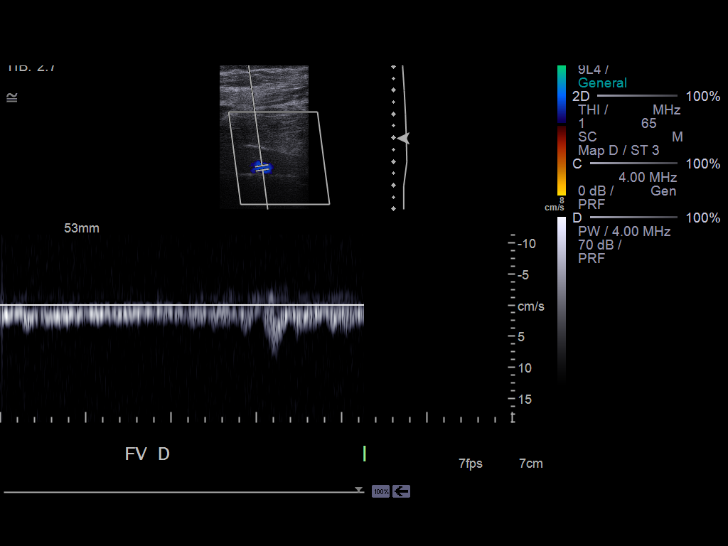
[im 32/36]
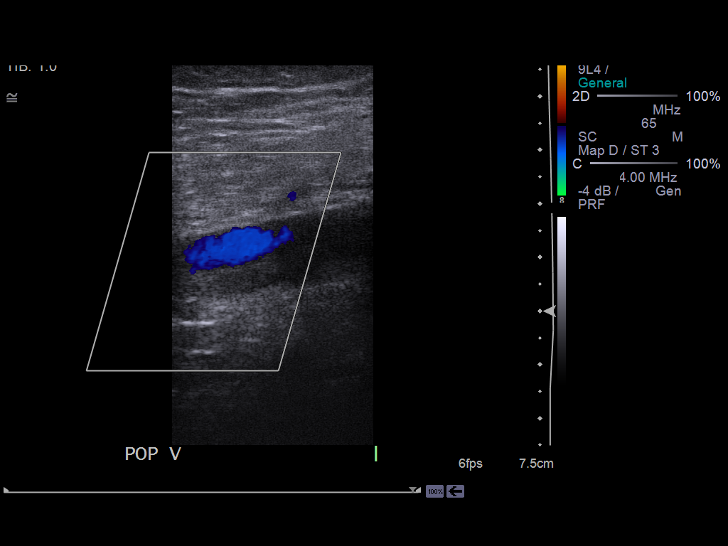
[im 36/36]
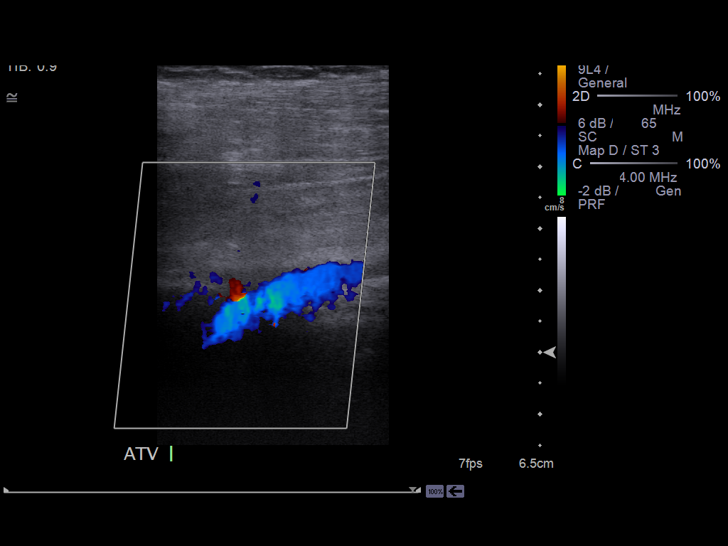

[14 of 24 positions shown; findings below may reference images not displayed]

FINDINGS: Deep venous thrombus identified in the deep venous system of the
left lower extremity, including the left common femoral vein, left
femoral vein, and left popliteal vein.  Observed thrombus is
nonocclusive.  Additional thrombus identified at the left greater
saphenous vein observed segments contains nonocclusive hypoechoic
intraluminal thrombus, are noncompressible, and demonstrate
impaired spontaneous venous flow.
IMPRESSION: Deep venous thrombosis throughout the left lower extremity.

## 2011-03-19 IMAGING — CT CT ANGIO CHEST
1 of 6 series · 5 of 36 positions shown · IV contrast (Omnipaque 300)
Comparison: None

CLINICAL DATA: Bilateral lower extremity deep venous thrombosis,
question pulmonary embolism or occult malignancy

CT ANGIOGRAPHY CHEST WITH CONTRAST
TECHNIQUE: Multidetector CT imaging of the chest was performed
using the standard protocol during bolus administration of
intravenous contrast.  Multiplanar CT image reconstructions
including MIPs were obtained to evaluate the vascular anatomy.
Imaging was repeated due to inadequate opacification of the
pulmonary arterial tree in the mid-to-lower lungs due to mis-timing
of imaging versus contrast bolus.
Contrast:  120 ml Omnipaque 350 IV; repeat imaging with 80 ml
Omnipaque 350 IV

[Series 7: pe 3.0 b40f · axial · 0.74mm/px · z∈[-296,-128]mm · 5 of 86 slices shown]
[im 15/86  lung]
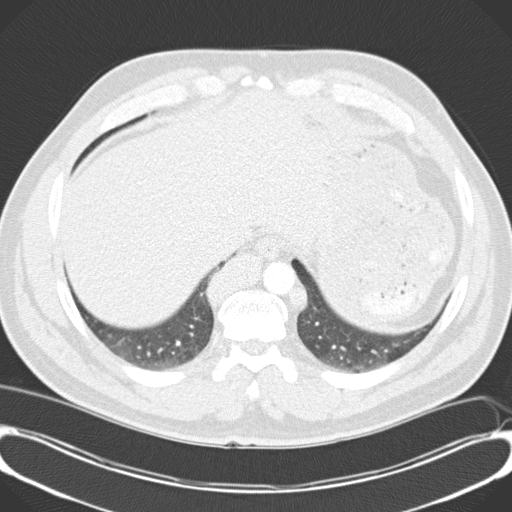
[im 29/86  mediastinal]
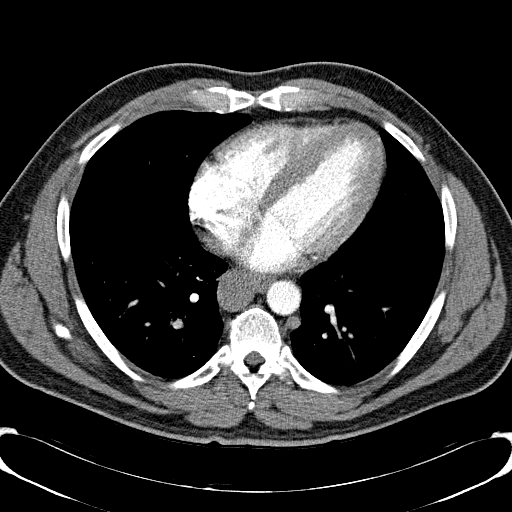
[im 43/86  lung]
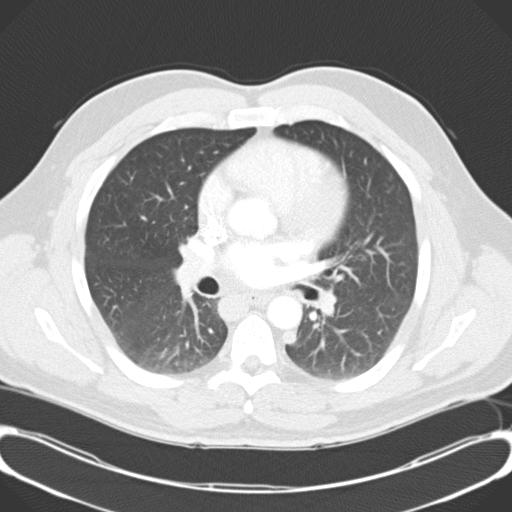
[im 57/86  mediastinal]
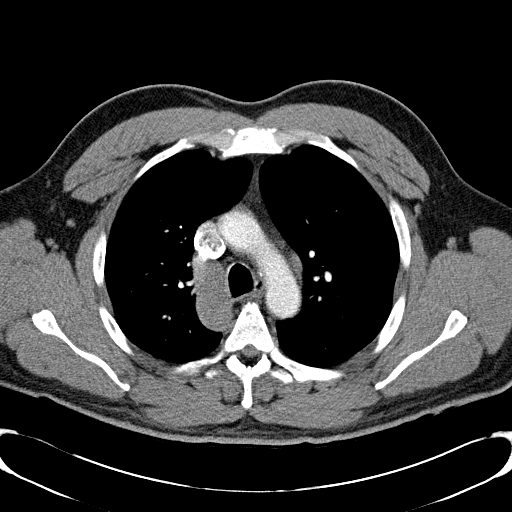
[im 71/86  lung]
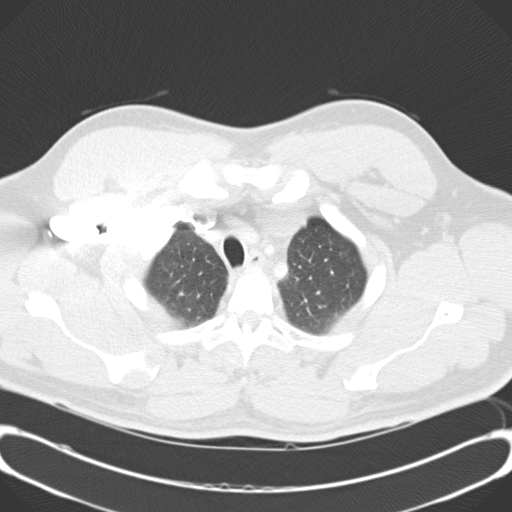

[5 of 36 positions shown; findings below may reference images not displayed]

FINDINGS: Aorta normal caliber without aneurysm or dissection.
Occlusion of the intrahepatic IVC with collateral flow through
anterior abdominal/chest wall, the azygos vein, and hemi azygos
vein.
Small filling defects identified in a right lower lobe pulmonary
artery compatible small pulmonary emboli.
Remaining deep venous system is grossly patent.
Visualized portions of upper abdomen otherwise unremarkable.
No definite thoracic adenopathy.
Mild dependent atelectasis in both lungs predominately lower lobe.
No definite pulmonary infiltrate, pleural effusion or mass.
No acute osseous findings.

Review of the MIP images confirms the above findings.
IMPRESSION: Small pulmonary emboli in right lower lobe.
Chronic occlusion of IVC with collateral flow via primarily the
azygos and hemiazygous systems.
Minimal dependent atelectasis in the lower lobes.

Findings discussed with Dr. MERLOS at time of interpretation,
[OO] hours on [DATE].

## 2011-03-19 MED ORDER — IOHEXOL 350 MG/ML SOLN
80.0000 mL | Freq: Once | INTRAVENOUS | Status: AC | PRN
  Administered 2011-03-19: 80 mL via INTRAVENOUS

## 2011-03-20 DIAGNOSIS — Z86718 Personal history of other venous thrombosis and embolism: Secondary | ICD-10-CM

## 2011-03-20 LAB — DIFFERENTIAL
Basophils Absolute: 0 10*3/uL (ref 0.0–0.1)
Lymphocytes Relative: 35 % (ref 12–46)
Monocytes Absolute: 0.7 10*3/uL (ref 0.1–1.0)
Neutro Abs: 3.2 10*3/uL (ref 1.7–7.7)
Neutrophils Relative %: 51 % (ref 43–77)

## 2011-03-20 LAB — CBC
HCT: 47.3 % (ref 39.0–52.0)
Hemoglobin: 16.5 g/dL (ref 13.0–17.0)
RBC: 5.43 MIL/uL (ref 4.22–5.81)
WBC: 6.3 10*3/uL (ref 4.0–10.5)

## 2011-03-20 LAB — PROTIME-INR: INR: 1.02 (ref 0.00–1.49)

## 2011-03-20 NOTE — Group Therapy Note (Signed)
  NAME:  Matthew Wise, Matthew Wise               ACCOUNT NO.:  [id number]  MEDICAL RECORD NO.:  [id number]           PATIENT TYPE:  I  LOCATION:  A339                          FACILITY:  [facility]  PHYSICIAN:  Matthew Wise, M.D.DATE OF BIRTH:  1961-01-11  DATE OF PROCEDURE:  03/19/2011 DATE OF DISCHARGE:                                PROGRESS NOTE   This man is stable.  He does not have any particular symptoms such as dyspnea or shortness of breath.  He did have a CT scan of his abdomen and pelvis yesterday which showed chronic occlusion of the IVC and pelvic venous system.  More importantly, there was no evidence of adenopathy or a pelvic mass.  He is in the process of being evaluated by the Hematology Team to give [place] any more recommendations.  PHYSICAL EXAMINATION:  VITAL SIGNS:  Temperature 98.8, blood pressure 130/76, pulse 65, saturation 95% on room air. CHEST:  Lung fields are clear.  INVESTIGATIONS:  Today showed a hemoglobin of 16.2, white blood cellcount 5.8, platelets 138.  TSH elevated at 5.76.  INR 1.05. homocysteine level as part of the hypercoagulable panel is 17.0 which is above the upper limit of normal.  IMPRESSION: 1. Right leg deep vein thrombosis and also deep vein thrombosis in the     inferior vena cava as well as history of recurrent left deep vein     thrombosis. 2. Hypothyroidism.  PLAN: 1. Start Synthroid 25 mcg daily. 2. Continue anticoagulation with Lovenox and Coumadin. 3. Await for opinion from Dr. Mariel Sleet and his team before the     patient can be discharged.  I think may be able to be discharged     tomorrow with Lovenox as well as Coumadin.     Matthew Wise, M.D.     NCG/MEDQ  D:  03/19/2011  T:  03/20/2011  Job:  [id number]  Electronically Signed by Lilly Cove M.D. on 03/20/2011 07:00:20 AM

## 2011-03-21 NOTE — Discharge Summary (Signed)
  NAME:  Matthew Wise, ESSICK               ACCOUNT NO.:  [id number]  MEDICAL RECORD NO.:  [id number]           PATIENT TYPE:  I  LOCATION:  A339                          FACILITY:  [facility]  PHYSICIAN:  Wilson Singer, M.D.DATE OF BIRTH:  [birth year]  DATE OF ADMISSION:  03/17/2011 DATE OF DISCHARGE:  04/20/2012LH                              DISCHARGE SUMMARY   FINAL DISCHARGE DIAGNOSES: 1. Right leg deep vein thrombosis. 2. Left leg deep vein thrombosis. 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 4. Small pulmonary emboli in right lower lobe.  CONDITION ON DISCHARGE:  Stable.  MEDICATIONS ON DISCHARGE: 1. Sodium docusate 100 mg b.i.d. 2. Lovenox 130 mg subcutaneously twice a day for at least 1 week. 3. Warfarin 15 mg daily.  HISTORY:  This unfortunate 50 year old man was admitted with a swollen right leg.  Please see initial history and physical examination done by Dr. Lilly Cove.  HOSPITAL PROGRESS:  On admission, a venous Doppler of the right leg showed the presence of DVT thrombosed right lower extremity.  In fact, he also had another venous Doppler done on the left leg which showed DVT throughout the left lower extremity too.  He had CT scan of his abdomen and pelvis looking for mass or malignancy, but this only confirmed the presence of chronic occlusion of the inferior vena cava and pelvic venous system.  There were no masses and no malignancy seen.  There was hepatic steatosis.  Hematology was involved in his care and they ordered a CT angiogram of the chest which showed presence of small pulmonary emboli in the right lower lobe.  Again, there was no evidence of malignancy.  He also had lab drawn for hypercoagulable panel and so far the results are essentially within the normal range.  Hematology will address this.  Today, he is feeling well with no chest pain or dyspnea.  PHYSICAL EXAMINATION:  VITAL SIGNS:  Temperature 98.1, blood pressure 133/84, pulse 60,  saturation 100% on room air. CHEST:  Lung fields are clear. CARDIAC:  Heart sounds are present and normal.  INR today is only 1.02.  Hemoglobin 16.5, white blood cell count 6.3, platelets 143.  DISPOSITION:  The patient is now stable for discharge, but obviously he will need to continue Lovenox at home until his INR is therapeutic between 2 and 3.  He will clearly need to be on warfarin for the rest of his life and he will follow up with the Hematology Clinic early next week.  He does not have a primary care physician and we will urge [facility] to [facility] to [facility].     Wilson Singer, M.D.     NCG/MEDQ  D:  03/20/2011  T:  03/21/2011  Job:  [id number]  cc:   Ladona Horns. Mariel Sleet, MD Fax: [phone]  [facility]  Electronically Signed by Lilly Cove M.D. on 03/21/2011 12:49:56 PM

## 2011-03-23 ENCOUNTER — Encounter (HOSPITAL_COMMUNITY): Payer: Self-pay | Attending: Oncology | Admitting: Oncology

## 2011-03-23 ENCOUNTER — Encounter (HOSPITAL_COMMUNITY): Payer: Self-pay

## 2011-03-23 DIAGNOSIS — I82409 Acute embolism and thrombosis of unspecified deep veins of unspecified lower extremity: Secondary | ICD-10-CM

## 2011-03-23 DIAGNOSIS — I824Y9 Acute embolism and thrombosis of unspecified deep veins of unspecified proximal lower extremity: Secondary | ICD-10-CM | POA: Insufficient documentation

## 2011-03-23 DIAGNOSIS — Z7901 Long term (current) use of anticoagulants: Secondary | ICD-10-CM | POA: Insufficient documentation

## 2011-03-25 ENCOUNTER — Encounter (HOSPITAL_COMMUNITY): Payer: Self-pay

## 2011-03-25 DIAGNOSIS — I82409 Acute embolism and thrombosis of unspecified deep veins of unspecified lower extremity: Secondary | ICD-10-CM

## 2011-03-26 ENCOUNTER — Encounter (HOSPITAL_COMMUNITY): Payer: Self-pay

## 2011-03-26 DIAGNOSIS — I82409 Acute embolism and thrombosis of unspecified deep veins of unspecified lower extremity: Secondary | ICD-10-CM

## 2011-03-27 ENCOUNTER — Encounter (HOSPITAL_COMMUNITY): Payer: Self-pay

## 2011-03-27 DIAGNOSIS — I82409 Acute embolism and thrombosis of unspecified deep veins of unspecified lower extremity: Secondary | ICD-10-CM

## 2011-03-28 ENCOUNTER — Encounter (HOSPITAL_COMMUNITY): Payer: Self-pay | Attending: Oncology

## 2011-03-28 DIAGNOSIS — I82409 Acute embolism and thrombosis of unspecified deep veins of unspecified lower extremity: Secondary | ICD-10-CM

## 2011-03-29 ENCOUNTER — Encounter (HOSPITAL_COMMUNITY): Payer: Self-pay

## 2011-03-29 DIAGNOSIS — I82409 Acute embolism and thrombosis of unspecified deep veins of unspecified lower extremity: Secondary | ICD-10-CM

## 2011-03-30 ENCOUNTER — Encounter (HOSPITAL_COMMUNITY): Payer: Self-pay

## 2011-03-30 DIAGNOSIS — I82409 Acute embolism and thrombosis of unspecified deep veins of unspecified lower extremity: Secondary | ICD-10-CM

## 2011-04-01 ENCOUNTER — Encounter (HOSPITAL_COMMUNITY): Payer: Self-pay | Attending: Oncology

## 2011-04-01 DIAGNOSIS — Z86718 Personal history of other venous thrombosis and embolism: Secondary | ICD-10-CM | POA: Insufficient documentation

## 2011-04-01 DIAGNOSIS — I82220 Acute embolism and thrombosis of inferior vena cava: Secondary | ICD-10-CM | POA: Insufficient documentation

## 2011-04-01 DIAGNOSIS — I82409 Acute embolism and thrombosis of unspecified deep veins of unspecified lower extremity: Secondary | ICD-10-CM

## 2011-04-08 ENCOUNTER — Encounter (HOSPITAL_COMMUNITY): Payer: Self-pay

## 2011-04-08 ENCOUNTER — Other Ambulatory Visit (HOSPITAL_COMMUNITY): Payer: Self-pay | Admitting: Oncology

## 2011-04-08 DIAGNOSIS — I82409 Acute embolism and thrombosis of unspecified deep veins of unspecified lower extremity: Secondary | ICD-10-CM

## 2011-04-08 LAB — PROTIME-INR
INR: 3.78 — ABNORMAL HIGH (ref 0.00–1.49)
Prothrombin Time: 37.3 seconds — ABNORMAL HIGH (ref 11.6–15.2)

## 2011-04-10 ENCOUNTER — Other Ambulatory Visit (HOSPITAL_COMMUNITY): Payer: Self-pay | Admitting: Oncology

## 2011-04-10 ENCOUNTER — Encounter (HOSPITAL_COMMUNITY): Payer: Self-pay

## 2011-04-10 DIAGNOSIS — I82409 Acute embolism and thrombosis of unspecified deep veins of unspecified lower extremity: Secondary | ICD-10-CM

## 2011-04-10 LAB — PROTIME-INR
INR: 3.61 — ABNORMAL HIGH (ref 0.00–1.49)
Prothrombin Time: 36 seconds — ABNORMAL HIGH (ref 11.6–15.2)

## 2011-04-13 ENCOUNTER — Other Ambulatory Visit (HOSPITAL_COMMUNITY): Payer: Self-pay | Admitting: Oncology

## 2011-04-13 ENCOUNTER — Encounter (HOSPITAL_COMMUNITY): Payer: Self-pay

## 2011-04-13 DIAGNOSIS — I82409 Acute embolism and thrombosis of unspecified deep veins of unspecified lower extremity: Secondary | ICD-10-CM

## 2011-04-14 NOTE — Discharge Summary (Signed)
NAME:  Matthew Wise, Matthew Wise NO.:  [id number]   MEDICAL RECORD NO.:  [id number]          PATIENT TYPE:  INP   LOCATION:  A317                          FACILITY:  [facility]   PHYSICIAN:  Osvaldo Shipper, MD     DATE OF BIRTH:  May 15, 1961   DATE OF ADMISSION:  11/27/2008  DATE OF DISCHARGE:  01/01/2010LH                               DISCHARGE SUMMARY   ADDENDUM:  I was called by the home health nurse with Mr. Boulay INR.  They  could not reach Dr. Sharyon Medicus office.  She reported an INR of 1.3.  The  patient was discharged on Coumadin 7.5 mg daily and he was discharged in  January 1.  So his INR has not changed much.  So, I told the nurse to  conveye to the patient to take 10 mg today, January 3 and then 10 mg in  January 4 and then he has an appointment on January 5 with Dr. Sherwood Gambler.  At that point, further decisions regarding his Coumadin dosage can be  made.  I also emphasized to the nurse that he needs to continue taking  his Lovenox twice daily.      Osvaldo Shipper, MD  Electronically Signed     GK/MEDQ  D:  12/02/2008  T:  12/02/2008  Job:  [id number]   cc:   Madelin Rear. Sherwood Gambler, MD  Fax: [phone]

## 2011-04-14 NOTE — H&P (Signed)
NAME:  Matthew Wise, Matthew Wise NO.:  [id number]   MEDICAL RECORD NO.:  [id number]          PATIENT TYPE:  INP   LOCATION:  5118                         FACILITY:  [facility]   PHYSICIAN:  Renee Ramus, MD       DATE OF BIRTH:  22-Apr-1961   DATE OF ADMISSION:  12/09/2008  DATE OF DISCHARGE:                              HISTORY & PHYSICAL   The patient is a 50 year old male diagnosed with DVT on November 28, 2008.  The patient was discharged from the hospital with Coumadin and  Lovenox on December 02, 2008, who presented to the Emergency Department at  [facility] on December 06, 2008.  He had stopped taking his Lovenox at  that time.  He was given 1 Lovenox injection and told to follow up with  his primary care physician.  The patient did not do this and now  presents to the emergency department with increased lower extremity  edema and subtherapeutic INR without additional Lovenox.   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   SOCIAL HISTORY:  The patient does smoke 1 pack per day.  He is married.   FAMILY HISTORY:  Not available.   REVIEW OF SYSTEMS:  All other comprehensive review of systems are  negative.   ALLERGIES:  He has no known drug allergies.   MEDICATIONS:  1. Coumadin 10 mg p.o. daily.  2. Lovenox 120 mg subcu b.i.d., which he was not taking.   PHYSICAL EXAMINATION:  GENERAL:  He is a well-developed, well-nourished  [place] male, currently in no apparent distress.  VITAL SIGNS:  Blood pressure 135/86, heart rate 85, respiratory rate 20,  temperature 97.4.  HEENT:  No jugular venous distention.  No lymphadenopathy.  Oropharynx  is clear.  Mucous membranes are pink and moist.  TMs clear bilaterally.  Pupils equal, reactive to light and accommodation.  Extraocular muscles  are intact.  CARDIOVASCULAR:  Regular rate and rhythm without murmurs, rubs, or  gallops.  PULMONARY:  Lungs clear to auscultation bilaterally.  ABDOMEN:   Soft, nontender, nondistended without hepatosplenomegaly.  Bowel sounds are present.  He has no rebound or guarding.  EXTREMITIES:  He has no clubbing, cyanosis, or edema.  He has good  peripheral pulses in dorsalis pedis and exacerbated and swollen left  lower extremity with respect to right.  He has good distal pulses.  NEUROLOGICAL:  Cranial nerves II through XII are grossly intact.  There  are no focal neurological deficits.   LABORATORY DATA:  White count 8.6, H and H 18.4 and 54.  Platelets 213.  Sodium 129, potassium 4.1, chloride 104, bicarb 99.  BUN 8, creatinine  1.3, glucose 99, INR 1.8.   ASSESSMENT/PLAN:  1. Deep venous thrombosis.  We will continue Lovenox b.i.d. with      Coumadin and discharge with therapeutic.  2. Thrombophlebitis.  Treat with NSAIDS and Vicodin.  3. Erythrocytosis.  Check iron studies and give IV fluids.  We would      anticipate a decrease in hemoglobin and hematocrit.  We do not  believe that this actually represents polycythemia.   DISPOSITION:  We will most likely discharge him in 24-48 hours.  H and P  was constructed by reviewing past medical history confirming with  emergency medical room physician.  Reviewing the emergency medical  record.   TIME SPENT:  One hour.      Renee Ramus, MD  Electronically Signed     JF/MEDQ  D:  12/09/2008  T:  12/10/2008  Job:  [id number]   cc:   Osvaldo Shipper, MD

## 2011-04-14 NOTE — H&P (Signed)
NAME:  Matthew Wise, Matthew Wise NO.:  [id number]   MEDICAL RECORD NO.:  [id number]          PATIENT TYPE:  INP   LOCATION:  A317                          FACILITY:  [facility]   PHYSICIAN:  Dorris Singh, DO    DATE OF BIRTH:  1961-07-06   DATE OF ADMISSION:  11/27/2008  DATE OF DISCHARGE:  LH                              HISTORY & PHYSICAL   The patient is a 50 year old African American male who presented to  [facility] Emergency Room with a chief complaint of left leg pain.  He  states that for the last 3 days he has noticed an increased swelling of  his left leg.  Also he states he has had a history of a DVT in his legs  several years ago and this is how it felt.  He did not see any break in  his skin.  Denies any trauma to that leg.  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  The patient has had a history too  of extensive bilateral varicose veins.   REVIEW OF SYSTEMS:  Constitutionally negative for weakness.  EYES:  Negative for changes in vision.  EARS, NOSE, MOUTH, THROAT:  Negative  for changes in hearing, taste or sore throat.  CARDIOVASCULAR:  No chest  pain.  RESPIRATORY:  Negative for wheezing or cough.  GASTROINTESTINAL:  Negative for abdominal pain, nausea, vomiting, constipation.  GU:  Negative for hesitancy or dysuria.  MUSCULOSKELETAL:  Positive for  arthralgias and leg swelling and redness.  SKIN:  Positive for erythema  of the left leg.  NEURO:  Negative for lack of consciousness.  PSYCHIATRIC:  Negative for changes in mood.  METABOLIC:  Negative for  intolerances to hot or cold.   PAST MEDICAL HISTORY:  Significant for blood clot as mentioned in the  past.   SURGICAL HISTORY:  He denies.   SOCIAL HISTORY:  No drug use.  He does smoke about a pack a day and  currently is married.   He has no known drug allergies.   CURRENT MEDICATION LIST:  He does not have any medications.   PHYSICAL EXAM:  The patient's vitals are as  follows:  Temperature 100.3,  pulse 100, respirations 20, systolic blood pressure 149/75.  The patient is a 50 year old African male who is well-developed, well-  nourished, no acute distress.  HEART:  Head is normocephalic, atraumatic.  EYES:  PERRLA.  EOMI.  No scleral icterus or conjunctival injection.  THROAT:  Clear.  NECK:  Supple.  No lymphadenopathy.  HEART:  Regular rate rhythm.  LUNGS:  Clear to auscultation bilaterally.  ABDOMEN:  Soft, nontender, nondistended.  Left extremity positive pulses with multiple spider and varicose veins  noted.  Right leg is erythematous up to mid thigh with positive varicose  veins with spider veins as well.  Full range of motion of both  extremities.  Cranial nerves II-XII grossly intact.   LABS FOR TODAY:  Are as follows:  White count of 9.5, hemoglobin of  17.5, hematocrit of 51.8, platelet count of 178.  INR of 0.9.  Sodium  135, potassium 3.6, chloride 99, CO2 26, glucose 98, BUN 9, and  creatinine 1.11.  Blood cultures are pending.   ASSESSMENT/PLAN:  Left lower leg swelling, cellulitis, rule out deep  vein thrombosis.   PLAN:  We will admit the patient to service of [facility].  We will start  him on IV antibiotics.  Due to the possibility of this being long-term,  longer than 7 days, we will also set the patient up for a PICC line.  We  are awaiting a Doppler of the lower extremities to rule out whether or  not this is a DVT.  The patient has been started on IV antibiotics.  We  will keep the limb elevated.  We will do DVT and GI prophylaxis and will  continue to monitor him.      Dorris Singh, DO  Electronically Signed     CB/MEDQ  D:  11/28/2008  T:  11/28/2008  Job:  [id number]

## 2011-04-14 NOTE — Discharge Summary (Signed)
NAME:  Matthew Wise, LIGHTNER NO.:  [id number]   MEDICAL RECORD NO.:  [id number]          PATIENT TYPE:  INP   LOCATION:  A317                          FACILITY:  [facility]   PHYSICIAN:  Osvaldo Shipper, MD     DATE OF BIRTH:  February 28, 1961   DATE OF ADMISSION:  11/27/2008  DATE OF DISCHARGE:  01/01/2010LH                               DISCHARGE SUMMARY   The patient does not have a PMD.  However, he has seen Dr. Sherwood Gambler in the  past, and Dr. Sharyon Medicus office has agreed to see him for his acute illness  at this time.   DISCHARGE DIAGNOSES:  1. Acute deep venous thrombosis of the left lower extremity.  2. History of venous thromboembolism in the past.   Please review H and P dictated by Dr. Dorris Singh for details  regarding the patient's presenting illness.   BRIEF HOSPITAL COURSE:  Briefly, this is a 50 year old African American  male who has a history of DVT back in 2004 who also had a family history  of blood clots according to him who presented to the hospital with  complaints of pain and swelling in his left leg.  The patient was  admitted to the hospital and it was felt initially that he may have  cellulitis, so he was started on vancomycin and Levaquin.  However, an  ultrasound was also ordered, and it, indeed, showed a DVT in his left  leg.  The patient was started on anticoagulation, and his vancomycin was  discontinued.  His Levaquin also will be stopped today.  Coumadin was  initiated 2 days ago.  The rest of the treatment can be continued as an  outpatient.  We will arrange Lovenox for him.  Home health will check a  PT/INR on Sunday, and the patient has an appointment to see Dr. Sherwood Gambler on  Tuesday, January 5th.  The patient has been explained about Coumadin.  He has been on this medication in the past.  He also tells me that one  of his nephews had a blood clot as well.  There is also a premature  death in the family.  This patient in all likelihood could have a  familial and hereditary hypercoagulable state.  I have ordered the  factor 5 Leiden prothrombin gene mutation studies as well as  antithrombin III studies.  The protein C, protein S needs to be done as  an outpatient at a later date.  When he was here back in 2004 apparently  he had some studies ordered.  He did have lupus anticoagulant done and  it was not detected at that time, and actually he did have protein C,  protein S which were normal.  Antithrombin III was also normal at that  time, and factor V mutation was also negative at that time so patient  has had an extensive hypercoagulable workup.  There were also still a  few other studies that may be considered.  However, I would defer to Dr.  Sherwood Gambler to have this patient seen by hematology.   On the day of discharge  the patient is feeling well.  His pain and  swelling have significantly improved.  Denies any chest pain or  shortness of breath.   VITAL SIGNS:  Were all stable, saturating 96% on room air.  LUNGS:  Clear to auscultation bilaterally.  CARDIOVASCULAR:  S1, S2 is normal, regular.  ABDOMEN:  Soft.  Left leg shows decreasing swelling and decreasing  tenderness.   LABORATORIES:  His labs show his INR is still subtherapeutic at 1.0.  The rest of his lab work is unremarkable.   DISCHARGE MEDICATIONS:  1. Lovenox 120 mg subcutaneously twice daily for 5 days.  2. Coumadin 7.5 mg orally daily in the evenings.  3. Tylox 1 tablet every 6 hours as needed, 20 tablets prescribed.   FOLLOWUP:  1. PT/INR to be checked by home health on Sunday, January 3rd.  This      has been arranged.  2. With Dr. Sharyon Medicus office on December 05, 2007.   DIET:  As before.   PHYSICAL ACTIVITY:  Increase activity slowly.   No consultations obtained during this admission.  Apart from the venous  Doppler study, no other imaging studies were done.   Total time on this discharge encounter 35 minutes.      Osvaldo Shipper, MD  Electronically  Signed     GK/MEDQ  D:  11/30/2008  T:  11/30/2008  Job:  [id number]   cc:   Madelin Rear. Sherwood Gambler, MD  Fax: [phone]

## 2011-04-14 NOTE — Discharge Summary (Signed)
NAME:  Matthew Wise, Matthew Wise NO.:  [id number]   MEDICAL RECORD NO.:  [id number]          PATIENT TYPE:  INP   LOCATION:  5118                         FACILITY:  [facility]   PHYSICIAN:  Herbie Saxon, MDDATE OF BIRTH:  [birth year]   DATE OF ADMISSION:  12/09/2008  DATE OF DISCHARGE:  12/12/2008                               DISCHARGE SUMMARY   DISCHARGE DIAGNOSES:  1. Left leg recurrent deep venous thrombosis.  2. Poor medication compliance.  3. Thrombophlebitis improved.  4. Erythrocytosis improved.  5. Hypokalemia repleted.  6. Mild iron deficiency.  7. Tobacco abuse.   HOSPITAL COURSE:  This 50 year old male who was diagnosed with left leg  DVT on November 28, 2008, had been discharged on Lovenox and Coumadin,  but has not been compliant with his Lovenox injection, and he presented  to the emergency room on December 09, 2008, with increased left leg  edema, subtherapeutic INR.  He was admitted for Coumadin and Lovenox  bridging.  The left leg Doppler repeated on December 09, 2008, shows  extensive occlusive DVT throughout the whole left leg.  Hypokalemia was  repleted.  We will counsel on tobacco cessation.  Case manager has got  involved to get him free Lovenox.  On his Coumadin, he is nearly  therapeutic at present.  He is being discharged home in stable  condition.   DIET:  Regular.   ACTIVITY:  Increase slowly.   Follow up with his primary care physician, Dr. Sherwood Gambler in the next 2-3  days.  We will optimize the PT, INR.  Goal range is 2-3 as outpatient.   DISCHARGE MEDICATIONS:  1. Lovenox 120 mg subcu q.12 hours for 1 more day.  2. Coumadin 10 mg daily except for Wednesdays and Fridays.  Coumadin      is going to be 15 mg on Wednesdays and Fridays, to have INR at      least twice weekly in the next 1 week.  3. Other medications will include iron sulfate 325 mg daily.  4. Percocet 5/325 one to two tablets q.6 h. p.r.n. for moderate pain.   PHYSICAL  EXAMINATION:  GENERAL:  On examination today, he is a middle-  aged man in no acute distress.  VITAL SIGNS:  Temperature is 98, his pulse is 76, respiratory rate is  14, and blood pressure 125/79.  HEENT:  Pupils are equally reactive to light and accommodation.  Head is  atraumatic and normocephalic.  Mucous membranes are moist.  Oropharynx  and nasopharynx are clear.  NECK:  Supple.  No elevated JVD.  CHEST:  Clinically clear.  HEART:  Heart sounds 1 and 2, regular.  ABDOMEN:  Benign.  NEUROLOGIC:  He is alert and oriented to time, place, and person.  Peripheral pulses present.  Left calf edema and erythema much reduced,  minimal tenderness.   LABORATORY DATA:  INR is 1.8, PT 22, potassium level is 3.7, total iron  is 36.  Complete blood count; WBC of 7, hematocrit 46, and platelet  count 213. Chemistry.  The sodium is 136, chloride 101, bicarbonate 27,  glucose  is 105, BUN 9, creatinine 1.0.   DISCHARGE TIME:  Less than 30 minutes.      Herbie Saxon, MD  Electronically Signed     MIO/MEDQ  D:  12/12/2008  T:  12/13/2008  Job:  [id number]

## 2011-04-17 ENCOUNTER — Encounter (HOSPITAL_COMMUNITY): Payer: Self-pay

## 2011-04-17 ENCOUNTER — Other Ambulatory Visit (HOSPITAL_COMMUNITY): Payer: Self-pay | Admitting: Oncology

## 2011-04-17 DIAGNOSIS — I82409 Acute embolism and thrombosis of unspecified deep veins of unspecified lower extremity: Secondary | ICD-10-CM

## 2011-04-17 NOTE — Consult Note (Signed)
NAME:  MING, MCMANNIS NO.:  [id number]   MEDICAL RECORD NO.:  [id number]                   PATIENT TYPE:  INP   LOCATION:  A201                                 FACILITY:  [facility]   PHYSICIAN:  Ladona Horns. Mariel Sleet, MD               DATE OF BIRTH:  1961-10-05   DATE OF CONSULTATION:  DATE OF DISCHARGE:                                   CONSULTATION   REFERRING PHYSICIAN:  Hanley Hays. Josefine Class, M.D.   DIAGNOSES:  1. New onset of extensive left deep venous thrombosis with also chronic     changes on computed tomographic scan to the abdomen and pelvis of deep     venous thrombosis in the proximal right iliac vein.  2. 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  3. 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  4. History of smoking a pack of cigarettes a day for 19 years.  5. Obesity, weighing 238 pounds and he is 6 feet tall.   HISTORY:  This is a pleasant 50 year old African American gentleman who was  admitted on Monday with left leg swelling, which started on Sunday.  He  noticed actually a week ago some numbness in his left toe and some minimal  aching in his left thigh, but even three weeks before that he noticed some  numbness in his big toe intermittently, which he knew was different.  He  then walked his niece to a grocery store on Saturday.  That evening he  noticed more numbness in his big toe and some tightness in his thigh.  The  next day he noticed swelling in his lower leg and thigh, which he got worse,  and he came to the hospital Monday and was admitted.   FAMILY HISTORY:  He has a family history of possible clots in a brother, who  is deceased in his early 26s, though his brother also had a stroke.  A  sister, he states, had clots reportedly prior to her death in her 21s, but  he is not sure why she died officially.  He has a brother who has lost  several toes to arterial  disease of the foot.  His brother is also a heavy  smoker.  His father is deceased from emphysema and complications of COPD.  His mother is still alive.  He had a total of three brothers and five  sisters.  Nobody else has clots that he is aware of.  He is not aware of  paternal or maternal related brothers and sisters with clots.   SOCIAL HISTORY:  He has two children, a son and a daughter.  They are both  under 66 years of age and they do not have any clots that he is aware of.  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  He was married.  He is divorced now.   REVIEW OF SYSTEMS:  He is not aware of having any shortness of breath, chest  pain, hemoptysis, blood in his stools, etc.  He has not noticed any changes  in his testicles.  He has not had abdominal pain.  Otherwise  noncontributory.   PHYSICAL EXAMINATION:  VITAL SIGNS:  He is afebrile.  Blood pressure is  normal.  Pulse is 68-72 and regular, respirations 16 and unlabored.  NECK:  He has no palpable thyromegaly.  Supple.  LYMPH NODES:  He has no palpable lymphadenopathy in the cervical,  supraclavicular, infraclavicular, axillary, epitrochlear, or inguinal areas.  HEENT:  He is right-handed.  Facial symmetry is intact.  Tongue is normal to  midline.  Teeth are in normal to fair repair.  ABDOMEN:  He has no collateral veins on his abdominal wall, chest, or back.  No CVA or bony tenderness.  No hepatosplenomegaly.  There are no obvious  masses.  GENITALIA:  His testicles and penile examination are unremarkable.  EXTREMITIES:  He has no edema of his arms but he does have extensive edema  of his left leg and some very superficial varicose veins in the skin.  Pulses in his feet are 1-2+ and symmetrical.  There is increased warmth in  the left leg as well as the edema as mentioned.   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  He has extensive clot in the inferior vena cava and iliac veins.  The question is does he have a genetic disorder, which is familial, or does  he have this as a result of an inferior vena cava interruption?   PLAN:  Studies have been ordered by Dr. Josefine Class and we will await those  results.  In the meantime he will get continued anticoagulation with heparin  as he is doing and then he will be switched over to Coumadin.  I suspect he  will need a minimum of 6-12 months of anticoagulation.  Perhaps more if this  is familial in origin.  We will try to retrieve his brothers and sisters  charts.  They both died here in this hospital and we will try to review  their charts for further history.                                               Ladona Horns. Mariel Sleet, MD    ESN/MEDQ  D:  07/24/2003  T:  07/25/2003  Job:  [id number]   cc:   Hanley Hays. De[place], M.D.  [address]  [place]  [place] [address]  Fax: [phone]

## 2011-04-17 NOTE — Discharge Summary (Signed)
NAME:  Matthew Wise, Matthew Wise NO.:  [id number]   MEDICAL RECORD NO.:  [id number]                   PATIENT TYPE:  INP   LOCATION:  A307                                 FACILITY:  [facility]   PHYSICIAN:  Hanley Hays. Dechurch, M.D.           DATE OF BIRTH:  11/08/1961   DATE OF ADMISSION:  07/23/2003  DATE OF DISCHARGE:  07/30/2003                                 DISCHARGE SUMMARY   DIAGNOSIS:  Spontaneous left lower extremity deep venous thrombosis in the  setting of inferior vena cava anomaly; workup otherwise unremarkable; no  pulmonary embolus.   DISPOSITION:  The patient is being discharged to home for ongoing Lovenox  therapy until INR is greater than 2 to be dosed and followed by specialty  clinic/Dr. Mariel Sleet.  The patient counseled on smoking cessation and  substance abuse.   CONDITION:  Stable.   HOSPITAL COURSE:  A 50 year old African-American gentleman with no primary  M.D. who presented to the hospital with an approximately one-week history of  left lower extremity pain.  CT revealed extensive left lower extremity DVT  as well as thrombus in the left iliac vein.  CT of the abdomen revealed  abnormal vascular anatomy with a disrupted IVC and collateral clot in the  azygous system, as well as clot in the left common iliac vein as well.  There was calcification in the clot suggesting that this was chronic in  nature.  Given the history it was felt that he would need probable lifelong  anticoagulation therapy, although this will be deferred to the hematology  consultant.  The patient's workup for protein C, protein S, ANA, factor V  Leiden were all unremarkable.  The patient's status discussed with him at  length.  He required fairly high amounts of Coumadin and even at the time of  discharge his INR is only 1.5.  He is now ambulating.  He initially was  started on heparin on July 23, 2003 but then switched to Lovenox on August  25.  He has actually  remained clinically stable.  There was no evidence of  pulmonary embolism.  The patient was counseled on smoking and substance  abuse.  He will be discharged to follow up in the specialty clinics for  b.i.d. Lovenox dosing until his INR is therapeutic.   PHYSICAL EXAMINATION AT THE TIME OF DISCHARGE:  GENERAL:  A well-developed,  well-nourished gentleman.  VITAL SIGNS:  Blood pressure is 114/62, temperature is afebrile.  LUNGS:  Clear to auscultation.  HEART:  Regular with no murmur, gallop, or rub.  ABDOMEN:  Soft, nontender.  EXTREMITIES:  Without clubbing, cyanosis.  He has chronic thrombosis-type  findings in the left lower extremity with distended external veins.  Although the leg is softer it still remains edematous and bigger than the  right lower extremity as expected.  SKIN:  There is no skin rash, lesions, breakdown noted.  NEUROLOGIC:  Intact.    ASSESSMENT AND PLAN:  As noted above.  Follow-up through the specialty  clinics has been arranged.  The patient was encouraged to seek a primary  care physician and given a list of names.  He also will receive Coumadin  teaching from the pharmacy.                                               Hanley Hays Josefine Class, M.D.    FED/MEDQ  D:  07/30/2003  T:  07/30/2003  Job:  [id number]

## 2011-04-20 ENCOUNTER — Other Ambulatory Visit (HOSPITAL_COMMUNITY): Payer: Self-pay | Admitting: Oncology

## 2011-04-20 ENCOUNTER — Encounter (HOSPITAL_COMMUNITY): Payer: Self-pay | Admitting: Oncology

## 2011-04-20 DIAGNOSIS — I82409 Acute embolism and thrombosis of unspecified deep veins of unspecified lower extremity: Secondary | ICD-10-CM

## 2011-04-20 LAB — COMPREHENSIVE METABOLIC PANEL
AST: 19 U/L (ref 0–37)
Albumin: 3.5 g/dL (ref 3.5–5.2)
Alkaline Phosphatase: 98 U/L (ref 39–117)
BUN: 8 mg/dL (ref 6–23)
CO2: 30 mEq/L (ref 19–32)
Chloride: 98 mEq/L (ref 96–112)
Creatinine, Ser: 0.95 mg/dL (ref 0.4–1.5)
GFR calc Af Amer: >60 mL/min (ref >60)
GFR calc non Af Amer: >60 mL/min (ref >60)
Potassium: 3.8 mEq/L (ref 3.5–5.1)
Total Bilirubin: 0.7 mg/dL (ref 0.3–1.2)

## 2011-04-20 LAB — CBC
HCT: 41.7 % (ref 39.0–52.0)
Hemoglobin: 14.6 g/dL (ref 13.0–17.0)
MCHC: 35 g/dL (ref 30.0–36.0)
MCV: 84.9 fL (ref 78.0–100.0)
RDW: 13.2 % (ref 11.5–15.5)

## 2011-04-20 LAB — DIFFERENTIAL
Eosinophils Relative: 2 % (ref 0–5)
Lymphocytes Relative: 30 % (ref 12–46)
Lymphs Abs: 1.8 10*3/uL (ref 0.7–4.0)
Monocytes Absolute: 0.6 10*3/uL (ref 0.1–1.0)
Neutro Abs: 3.6 10*3/uL (ref 1.7–7.7)

## 2011-04-22 ENCOUNTER — Other Ambulatory Visit (HOSPITAL_COMMUNITY): Payer: Self-pay

## 2011-04-28 ENCOUNTER — Other Ambulatory Visit (HOSPITAL_COMMUNITY): Payer: Self-pay | Admitting: Oncology

## 2011-04-28 ENCOUNTER — Encounter (HOSPITAL_COMMUNITY): Payer: Self-pay

## 2011-04-28 DIAGNOSIS — I82409 Acute embolism and thrombosis of unspecified deep veins of unspecified lower extremity: Secondary | ICD-10-CM

## 2011-05-05 ENCOUNTER — Encounter (HOSPITAL_COMMUNITY): Payer: Self-pay | Attending: Oncology

## 2011-05-05 ENCOUNTER — Other Ambulatory Visit (HOSPITAL_COMMUNITY): Payer: Self-pay | Admitting: Oncology

## 2011-05-05 DIAGNOSIS — I82409 Acute embolism and thrombosis of unspecified deep veins of unspecified lower extremity: Secondary | ICD-10-CM

## 2011-05-05 DIAGNOSIS — Z86718 Personal history of other venous thrombosis and embolism: Secondary | ICD-10-CM | POA: Insufficient documentation

## 2011-05-05 DIAGNOSIS — Z7901 Long term (current) use of anticoagulants: Secondary | ICD-10-CM | POA: Insufficient documentation

## 2011-05-05 LAB — PROTIME-INR: Prothrombin Time: 25.1 seconds — ABNORMAL HIGH (ref 11.6–15.2)

## 2011-05-18 ENCOUNTER — Ambulatory Visit (HOSPITAL_COMMUNITY): Payer: Self-pay | Admitting: Oncology

## 2011-05-18 ENCOUNTER — Other Ambulatory Visit (HOSPITAL_COMMUNITY): Payer: Self-pay | Admitting: Oncology

## 2011-05-18 ENCOUNTER — Encounter (HOSPITAL_COMMUNITY): Payer: Self-pay | Admitting: Oncology

## 2011-05-18 ENCOUNTER — Encounter (HOSPITAL_COMMUNITY): Payer: Self-pay

## 2011-05-18 DIAGNOSIS — I2699 Other pulmonary embolism without acute cor pulmonale: Secondary | ICD-10-CM

## 2011-05-18 DIAGNOSIS — I82409 Acute embolism and thrombosis of unspecified deep veins of unspecified lower extremity: Secondary | ICD-10-CM

## 2011-05-18 LAB — PROTIME-INR: INR: 1.72 — ABNORMAL HIGH (ref 0.00–1.49)

## 2011-05-20 ENCOUNTER — Encounter (HOSPITAL_COMMUNITY): Payer: Self-pay | Admitting: *Deleted

## 2011-05-22 ENCOUNTER — Encounter (HOSPITAL_COMMUNITY): Payer: Self-pay

## 2011-05-22 ENCOUNTER — Other Ambulatory Visit (HOSPITAL_COMMUNITY): Payer: Self-pay | Admitting: Oncology

## 2011-05-22 DIAGNOSIS — I82409 Acute embolism and thrombosis of unspecified deep veins of unspecified lower extremity: Secondary | ICD-10-CM

## 2011-05-22 LAB — PROTIME-INR: Prothrombin Time: 29.7 seconds — ABNORMAL HIGH (ref 11.6–15.2)

## 2011-05-29 ENCOUNTER — Encounter (HOSPITAL_COMMUNITY): Payer: Self-pay

## 2011-05-29 ENCOUNTER — Other Ambulatory Visit (HOSPITAL_COMMUNITY): Payer: Self-pay | Admitting: Oncology

## 2011-05-29 DIAGNOSIS — I82409 Acute embolism and thrombosis of unspecified deep veins of unspecified lower extremity: Secondary | ICD-10-CM

## 2011-05-29 DIAGNOSIS — Z7901 Long term (current) use of anticoagulants: Secondary | ICD-10-CM

## 2011-05-29 LAB — PROTIME-INR
INR: 2.18 — ABNORMAL HIGH (ref 0.00–1.49)
Prothrombin Time: 24.6 seconds — ABNORMAL HIGH (ref 11.6–15.2)

## 2011-06-05 ENCOUNTER — Encounter: Payer: Self-pay | Admitting: Vascular Surgery

## 2011-06-05 ENCOUNTER — Other Ambulatory Visit (HOSPITAL_COMMUNITY): Payer: Self-pay | Admitting: Oncology

## 2011-06-05 ENCOUNTER — Encounter (HOSPITAL_COMMUNITY): Payer: PRIVATE HEALTH INSURANCE | Attending: Oncology

## 2011-06-05 DIAGNOSIS — I82409 Acute embolism and thrombosis of unspecified deep veins of unspecified lower extremity: Secondary | ICD-10-CM | POA: Insufficient documentation

## 2011-06-05 DIAGNOSIS — L0390 Cellulitis, unspecified: Secondary | ICD-10-CM | POA: Insufficient documentation

## 2011-06-05 DIAGNOSIS — L0291 Cutaneous abscess, unspecified: Secondary | ICD-10-CM | POA: Insufficient documentation

## 2011-06-05 LAB — PROTIME-INR: Prothrombin Time: 24 seconds — ABNORMAL HIGH (ref 11.6–15.2)

## 2011-06-10 NOTE — Progress Notes (Signed)
[facility] [place]  Referred by: Dr. Glenford Peers  Reason for referral: RLE edema  History of Present Illness  Matthew Wise is a 50 y.o. male who presents with chief complaint: persistent right leg swelling.  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  He has known previous RLE DVT and small PE.  The patient's R leg swelling has been persistent for several months.  His current therapeutic regimen includes: coumadin.  He is not compliant with his compression stocking regimen.  Past Medical History  Diagnosis Date  . DVT of leg (deep venous thrombosis)     rt. leg  . Inferior vena caval thrombosis     Chronic  . Hypothyroidism     History reviewed. No pertinent past surgical history.  History   Social History  . Marital Status: Divorced    Spouse Name: N/A    Number of Children: N/A  . Years of Education: N/A   Occupational History  . Not on file.   Social History Main Topics  . Smoking status: Current Everyday Smoker -- 1.0 packs/day for 30 years    Types: Cigarettes  . Smokeless tobacco: Not on file  . Alcohol Use: 14.4 oz/week    24 Cans of beer per week  . Drug Use: Yes    Special: Cocaine, Marijuana     REMOTE HISTORY, Quit in 2007  . Sexually Active:    Other Topics Concern  . Not on file   Social History Narrative  . No narrative on file    Family History  Problem Relation Age of Onset  . COPD Father   . Stroke Brother     Current outpatient prescriptions:warfarin (COUMADIN) 10 MG tablet, Take 12.5 mg by mouth daily.  , Disp: , Rfl:   Allergies as of 06/12/2011  . (No Known Allergies)    Review of Systems (Positive items in bold and italic, otherwise negative)  General: Weight loss, Weight gain, Loss of appetite, Fever  Neurologic: Dizziness, Blackouts, Headaches, Seizure  Ear/Nose/Throat: Change in eyesight, Change in hearing, Nose bleeds, Sore  throat  Vascular: Pain in legs with walking, Pain in feet while lying flat, Non-healing ulcer, Stroke, "Mini stroke", Slurred speech, Temporary blindness, Blood clot in vein, Phlebitis  Pulmonary: Home oxygen, Productive cough, Bronchitis, Coughing up blood, Asthma, Wheezing  Musculoskeletal: Arthritis, Joint pain, Muscle pain  Cardiac: Chest pain, Chest tightness/pressure, Shortness of breath when lying flat, Palpitations, Heart murmur, Arrythmia, Atrial fibrillation  Hematologic: Bleeding problems, Clotting disorder, Anemia  Psychiatric:  Depression, Anxiety, Attention deficit disorder  Gastrointestinal:  Black stool, Blood in stool, Peptic ulcer disease, Reflux, Hiatal hernia, Trouble swallowing, Diarrhea, Constipation  Urinary:  Kidney disease, Burning with urination, Frequent urination, Difficulty urinating  Skin: Ulcers, Rashes  Physical Examination  Filed Vitals:   06/12/11 1242  BP: 158/93  Pulse: 65  Temp: 97.7 F (36.5 C)    General: A&O x 3, WDWN,   Head: [place]/AT,   Ear/Nose/Throat: Hearing grossly intact, nares w/o erythema or drainage, oropharynx w/o Erythema/Exudate,   Eyes: PERRLA, EOMI,  Neck: Supple, no nuchal rigidity, no palpable LAD,   Pulmonary: Sym exp, good air movt, CTAB, no rales, rhonchi, & wheezing,   Cardiac: RRR, Nl S1, S2, no Murmurs, rubs or gallops,   Vascular: Vessel Right Left  Radial Palpable Palpable  Ulnary Palpable Palpable  Brachial Palpable Palpable  Carotid Palpable, without bruit Palpable, without bruit  Aorta Non-palpable  N/A  Femoral Palpable Palpable  Popliteal Non-palpable Non-palpable  PT Palpable Palpable  DP Palpable Palpable   Gastrointestinal: soft, NTND, -G/R, - HSM, - masses, - CVAT B,   Musculoskeletal: M/S 5/5 throughout, Extremities without ischemic changes, L leg: 2+ edema, R leg: 3+ pitting edema, Verrucous skin changes in L leg, previous healed VSU evident L medial malleolus, swollen B  thighs  Neurologic: CN 2-12 intact, Pain and light touch intact in extremities, Motor exam as listed above  Psychiatric: Judgment intact, Mood & affect appropriate for pt's clinical situation,   Dermatologic: See M/S exam for extremity exam, no rashes otherwise noted  Lymph : No Cervical, Axillary, or Inguinal lymphadenopathy   Non-Invasive Vascular Imaging  BLE Venous Duplex and Venous insufficiency duplexes ordered  Outside Studies/Documentation 20 pages of outside documents were reviewed including: hospital and outpatient records.  Medical Decision Making  Matthew Wise is a 50 y.o. 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   Unfortunately, in this patient's only management options include lifelong anti-coagulation and compression stockings.  Surgical reconstruction of the IVC is not routinely completed and rarely is necessary.  Venous duplex and insufficiency duplexes on both legs are ordered to evaluate severity of reflux in both legs and presence of any new thrombus  Referral to [facility] for evaluation for a ProFore compression bandage system to both legs.  Thank you for allowing [place] to participate in this patient's care.  Leonides Sake, MD [facility] [place] Office: [phone] Pager: [phone]

## 2011-06-10 NOTE — Consult Note (Signed)
NAME:  Matthew Wise, Matthew Wise NO.:  [id number]  MEDICAL RECORD NO.:  [id number]  LOCATION:  A339                          FACILITY:  [facility]  PHYSICIAN:  Ladona Horns. Mariel Sleet, MD  DATE OF BIRTH:  02-18-1961  DATE OF CONSULTATION:  03/19/2011 DATE OF DISCHARGE:                                CONSULTATION   REFERRING PHYSICIAN:  Dr. Karilyn Cota.  REASON FOR REFERRAL:  Recurrent DVTs.  HISTORY OF PRESENT ILLNESS:  The patient tells me that he has a history of left lower extremity DVTs.  However, this time most recently he is experiencing some right lower extremity swelling.  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  He tells me he has a history of approximately 4 or 5 DVTs. Dr. Mariel Sleet, saw the patient in consultation in August of 2004.  At that point in time, the patient had a right lower extremity DVT.  Dr. Mariel Sleet, at that point in time suggested that the patient undergo 6-12 months of anticoagulation therapy.  The patient tells me that his right lower extremity is not painful and is simply swollen.  This is quite evident on physical examination.  The patient tells me his last DVT was of the left lower leg and he says that was in 2010.  He tells me he is on anticoagulation therapy for approximately 6 months, but he was unsure of the exact time spent on what he was on anticoagulation therapy.  Most recently, the patient was admitted due to swelling of the right lower extremity.  A venous Doppler of the right lower leg did show an extensive DVT throughout the right lower extremity with numerous venous varicosities which contained age indeterminate thrombus.  He was last admitted for anticoagulation and as a result of hematologist was consulted.  A hypercoagulable panel was  ordered prior to anticoagulation therapy.  Results are pending, but the homocysteine level is high at 17.0.  PAST MEDICAL HISTORY: 1. Recurrent DVTs. 2. Polysubstance abuse.  ALLERGIES:  No known drug allergies.  CURRENT MEDICATIONS: 1. Low-molecular weight heparin (Lovenox) 130 mg every 12 hours. 2. Toradol 30 mg. 3. Coumadin 10 mg daily. 4. Oxycodone 5 mg every 4 hours p.r.n. 5. Ambien 5 mg at hour of sleep as needed.  PAST SURGICAL HISTORY:  The patient denies any surgical operations.  FAMILY HISTORY:  The patient tells me his father passed away while he was in his 45s due to emphysema and COPD complications.  His mother is still living at the age of 55.  He has three brothers and five sisters. Two brothers are deceased.  One passed away at the age of 42, due to complications of throat cancer.  He has a brother passed away in his 67s due to a stroke and questionable blood clot.  He has a brother who is 49 years old and is alive and well.  His one sister who has passed away when she was in her 84s due to questionable blood clot.  He has  a 38- year-old sister who has thyroid problems.  He has a 49 year old, 49 year old, and 25 year old sister who are alive and well with no complications.  He has a 66 year old son and a 68 year old daughter who are alive and well.  SOCIAL HISTORY:  The patient was born in [place] and presently resides in [place], [place].  He did complete high school.  He works for [employer].  He is divorced.  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  He does admit to at least a case of beer per week x10 plus years, history of alcohol abuse.  He does admit to a one pack per day smoking history for 30 plus years.  He does admit to a history of cocaine and marijuana use, quitting in 2007.  SCREENING TESTS:  The patient denies ever having a colonoscopy.  PERFORMANCE STATUS:  The patient's ECOG performance status is  1.  SOCIAL HISTORY:  The patient tells me he has a strong family support system.  PHYSICAL EXAMINATION:  VITAL SIGNS:  Vitals obtained on March 18, 2011, at 2200 hours revealed a temperature 98.8, pulse 65, respirations 18, blood pressure 130/76, oxygen saturation 98% on room air. GENERAL:  The patient is seen lying in bed.  He appears very pleasant. He does not appear to be in acute distress.  He is alert and oriented x3.  He appears pleasant. HEENT:  Atraumatic, normocephalic.  Anicteric sclerae. NECK:  No carotid bruits appreciated bilaterally.  No anterior or posterior cervical chain lymphadenopathy noted.  No submental or submandibular nodes noted.  Trachea is midline. NECK:  Supple. CARDIAC:  Regular rate and rhythm without murmur, rub, or gallop.  No S3 or S4 appreciated. LUNGS:  Clear to auscultation bilaterally without wheezes, rales, or rhonchi.  No accessory muscle use appreciated. ABDOMEN:  Positive bowel sounds in all four quadrants.  Soft. Nontender.  Obese.  No hepatosplenomegaly appreciated.  Although the patient's abdominal muscles are extremely strong and therefore it is difficult to assess for organomegaly. EXTREMITIES:  No upper extremity edema appreciated bilaterally.  Right lower extremity is tight in the calf region.  There is 2 to 3+ pitting edema noted from mid tibia distally.  Left lower extremity does not reveal the calf tightness and no edema.  No tenderness to palpation of the calves or popliteal fossas bilaterally.  Pedal pulse is strong on the left lower extremity, but diminished on the right lower extremity. LYMPHATICS:  No infraclavicular or supraclavicular lymphadenopathy noted.  No epitrochlear nodes appreciated.  No inguinal nodes noted. SKIN:  Capillary refill less than 1 second in fingers.  Skin is warm and dry.  Rapid turgor.  Radial pulse regular and strong 2+. NEUROLOGIC:  No focal deficit appreciated.  The patient is alert and oriented  x3.  LABORATORY DATA:  Laboratory data obtained today March 19, 2011, reveals a white blood cell count of 5.8, hemoglobin 16.2, hematocrit 46.7, platelet count 138.  PT 13.9, INR 1.05.  Homocysteine level 17.0. Complete metabolic panel performed on March 18, 2011, reveals a sodium of 135, potassium 3.6, chloride 100, bicarbonate 26, BUN 8, creatinine 1.03, glucose 99, total bilirubin 2.0.  RADIOGRAPHIC STUDIES:  CT of abdomen and pelvis with contrast performed on March 18, 2011, reveals chronic occlusion of the inferior vena cava and pelvic venous system as detailed above.  Extensive collateral vessels within the abdomen, lower chest, and pelvis.  Suspect a component of acute or subacute right external iliac thrombus.  No convincing evidence  for adenopathy or inguinal mass.  Extensive abnormal soft tissue within the inguinal region and pelvic sidewalls felt to be due to collateral vessel formation.  Hepatic steatosis.  Right lower extremity venous ultrasound performed on March 17, 2011, reveals extensive deep venous thrombosis throughout the right lower extremity.  Numerous venous varicosities.  Some of which contain age indeterminate thrombus.  Incidentally noted large varicosity traversing the hilum of a lymph node at the right inguinal region.  ASSESSMENT: 1. Right lower extremity deep vein thrombosis. 2. History of recurrent deep vein thromboses. 3. Polysubstance abuse.  PLAN:  Plan is as follows. 1. A hypercoagulable panel was already ordered and performed.  Results     are pending. 2. We will perform a CT angio of the chest to evaluate for pulmonary     embolus and occult malignancy.  The patient has a significant     history for recurrent deep vein thromboses. 3. After speaking to the hospitalist, if the CT angiogram is negative,     the patient is expected to be discharged tomorrow.  As a result, I     have scheduled the patient for a followup appointment at the [facility] for Monday March 23, 2011, at 10:30 a.m.  All questions were answered.  The patient knows to call the clinic with any problems, questions, or concerns.  The patient's plan discussed with Dr. Mariel Sleet, and he is in agreement with the aforementioned.    ______________________________ Maurine Minister Kefalas III, PA-C   ______________________________ Ladona Horns. Mariel Sleet, MD    TSK/MEDQ  D:  03/19/2011  T:  03/20/2011  Job:  [id number]  cc:   Madelin Rear. Sherwood Gambler, MD Fax: [phone]  Electronically Signed by Dellis Anes III PA on 05/28/2011 04:36:23 PM Electronically Signed by Glenford Peers MD on 06/10/2011 09:55:23 AM

## 2011-06-11 ENCOUNTER — Ambulatory Visit (HOSPITAL_COMMUNITY): Payer: Self-pay

## 2011-06-12 ENCOUNTER — Ambulatory Visit (INDEPENDENT_AMBULATORY_CARE_PROVIDER_SITE_OTHER): Payer: Self-pay | Admitting: Vascular Surgery

## 2011-06-12 ENCOUNTER — Encounter: Payer: Self-pay | Admitting: Vascular Surgery

## 2011-06-12 DIAGNOSIS — I871 Compression of vein: Secondary | ICD-10-CM

## 2011-06-12 DIAGNOSIS — I87009 Postthrombotic syndrome without complications of unspecified extremity: Secondary | ICD-10-CM

## 2011-06-12 NOTE — Progress Notes (Signed)
Nothing done on this day.

## 2011-06-12 NOTE — Progress Notes (Signed)
Eval for chronic DVT in BLE, hx of venous stasis ulcers ( seen at wound ctr when needed)

## 2011-06-15 ENCOUNTER — Other Ambulatory Visit (HOSPITAL_COMMUNITY): Payer: Self-pay | Admitting: Oncology

## 2011-06-15 ENCOUNTER — Encounter (HOSPITAL_COMMUNITY): Payer: Self-pay | Admitting: Oncology

## 2011-06-15 ENCOUNTER — Encounter (HOSPITAL_BASED_OUTPATIENT_CLINIC_OR_DEPARTMENT_OTHER): Payer: PRIVATE HEALTH INSURANCE | Admitting: Oncology

## 2011-06-15 DIAGNOSIS — I82409 Acute embolism and thrombosis of unspecified deep veins of unspecified lower extremity: Secondary | ICD-10-CM

## 2011-06-15 DIAGNOSIS — Q269 Congenital malformation of great vein, unspecified: Secondary | ICD-10-CM

## 2011-06-15 DIAGNOSIS — I2699 Other pulmonary embolism without acute cor pulmonale: Secondary | ICD-10-CM

## 2011-06-15 DIAGNOSIS — Q268 Other congenital malformations of great veins: Secondary | ICD-10-CM

## 2011-06-15 HISTORY — DX: Congenital malformation of great vein, unspecified: Q26.9

## 2011-06-15 HISTORY — DX: Other pulmonary embolism without acute cor pulmonale: I26.99

## 2011-06-15 HISTORY — DX: Other congenital malformations of great veins: Q26.8

## 2011-06-15 HISTORY — DX: Acute embolism and thrombosis of unspecified deep veins of unspecified lower extremity: I82.409

## 2011-06-15 LAB — PROTIME-INR: INR: 1.6 — ABNORMAL HIGH (ref 0.00–1.49)

## 2011-06-15 NOTE — Progress Notes (Signed)
[facility]  OUTPATIENT CLINIC NOTE  06/15/2011   DIAGNOSIS: Deep Vein Thrombosis   CURRENT THERAPY: Anticoagulation therapy with Coumadin 12.5/15mg  alternating.  S/P Lovenox bridging therapy.   HPI: Patient is seen for follow-up.  He denies any complaints today.  He recently saw vascular surgery to see if they could help with the patient's right lower extremity swelling secondary to DVT.  He explains that they told him that they could not operate on the vein that is inflicted with the clot.  However, vascular surgery did refer him to [facility] to have his LE compression wrapped.  I encouraged the patient to keep that appointment.  I spent some time with the patient going over tobacco abuse/smoking cessation.  He will try to decrease his dependence on the tobacco and decrease the number of cigarettes he is smoking daily.  He reports he smokes about 1 ppd.    The patient requests a letter stating that he has been out of work due to medical reasons because he has been unable to provide child support due to the fact that he is unable to work with this DVT and the associated woody infiltration/swelling.  This was written on a prescription.  He has been out of work since 03/17/11.  He initially presented to the hospital following a day at work on 03/14/11 when he was found to have an extensive DVT and PE.   REVIEW OF SYSTEMS: Constitutional: negative Respiratory: negative Cardiovascular: negative Gastrointestinal: negative Genitourinary:negative Hematologic/lymphatic: negative Musculoskeletal:negative Neurological: negative   PHYSICAL EXAMINATION: General Appearance: Normal - Healthy appearing patient in no acute distress  Skin: Normal - No skin rash, petechiae, ecchymosis noted  HEENT: Normal - No oral or pharyngeal masses, ulceration or thrush noted, no sinus tenderness  Neck: Normal - Supple, normal carotid upstrokes, no palpable thyromegaly  Lymph Nodes:  Normal - No palpable lymph nodes in the neck, supraclavicular, axillary, inguinal, or epitrochlear areas  Lungs/Thorax: Abnormal - CTA B/L with decreased breath sounds in all fields.  No wheezes, rales, rhonchi  Heart: Normal - Regular rate and rhythm, normal S1, S2, no appreciable murmurs, rubs, gallops  Pulses: Normal - 2+ throughout and symmetrical  Abdomen:Normal - Soft, nontender, bowel sounds present, no appreciable hepatosplenomegaly, no palpable masses  Musculoskeletal: Abnormal - Clubbing of the fingernails noted  Neurologic: Normal - Grossly intact   LABORATORY DATA:  Lab Results  Component Value Date   WBC 6.1 04/20/2011   HGB 14.6 04/20/2011   HCT 41.7 04/20/2011   MCV 84.9 04/20/2011   PLT 259 04/20/2011   Lab Results  Component Value Date   INR 2.11* 06/05/2011   INR 2.18* 05/29/2011   INR 2.77* 05/22/2011   .   PENDING LABS: PT/INR    ASSESSMENT:  1. B/L DVT in LE.  R>L 2. Right 4+ LE edema 3. Woody infiltration of R LE 4. R LE tightness 5. H/O recurrent DVT 6. Small PE 7. 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 8. R LE discomfort when ambulating   PLAN:  1. PT/INR today 2. Patient will keep appointment for compression wrapping of LE  3. Smoking cessation education 4. Return in one month 5. Prescription for the patient explaining he has been out of work since 03/17/11 secondary to DVT.   All questions were answered. The patient knows to call the clinic with any problems, questions or concerns. We can certainly see the patient much sooner if necessary.  The patient and plan  discussed with Glenford Peers, MD and he is in agreement with the aforementioned.  I spent 25 minutes counseling the patient face to face. The total time spent in the appointment was 40 minutes.  Giovonnie Trettel

## 2011-06-15 NOTE — Patient Instructions (Signed)
[facility]  Discharge Instructions  RECOMMENDATIONS MADE BY THE CONSULTANT AND ANY TEST RESULTS WILL BE SENT TO YOUR REFERRING DOCTOR.    INSTRUCTIONS GIVEN AND DISCUSSED: You will have a PT/INR drawn today.    SPECIAL INSTRUCTIONS/FOLLOW-UP: You are to see the doctor in one month.  You will be scheduled up front as you leave.     I acknowledge that I have been informed and understand all the instructions given to me and received a copy. I do not have any more questions at this time, but understand that I may call the Specialty Clinic at [facility] at [phone] during business hours should I have any further questions or need assistance in obtaining follow-up care.    __________________________________________  _____________  __________ Signature of Patient or Authorized Representative            Date                   Time    __________________________________________ Nurse's Signature

## 2011-06-18 ENCOUNTER — Other Ambulatory Visit (HOSPITAL_COMMUNITY): Payer: Self-pay | Admitting: Oncology

## 2011-06-18 ENCOUNTER — Encounter (HOSPITAL_BASED_OUTPATIENT_CLINIC_OR_DEPARTMENT_OTHER): Payer: PRIVATE HEALTH INSURANCE

## 2011-06-18 DIAGNOSIS — Z86718 Personal history of other venous thrombosis and embolism: Secondary | ICD-10-CM | POA: Insufficient documentation

## 2011-06-18 DIAGNOSIS — I82409 Acute embolism and thrombosis of unspecified deep veins of unspecified lower extremity: Secondary | ICD-10-CM

## 2011-06-18 NOTE — Progress Notes (Signed)
Labs drawn today for pt.  Coud dose 12.5mg  and 15mg  alt. Call patient at [phone]

## 2011-06-18 NOTE — Progress Notes (Signed)
Orders to continue same dose and pt/inr on Monday 0950. Patient instructed on new orders and appt time. He verbalized understanding.

## 2011-06-22 ENCOUNTER — Other Ambulatory Visit (HOSPITAL_COMMUNITY): Payer: Self-pay | Admitting: Oncology

## 2011-06-22 ENCOUNTER — Encounter (HOSPITAL_COMMUNITY): Payer: PRIVATE HEALTH INSURANCE

## 2011-06-22 ENCOUNTER — Telehealth (HOSPITAL_COMMUNITY): Payer: Self-pay | Admitting: *Deleted

## 2011-06-22 DIAGNOSIS — I82409 Acute embolism and thrombosis of unspecified deep veins of unspecified lower extremity: Secondary | ICD-10-CM

## 2011-06-22 DIAGNOSIS — I87009 Postthrombotic syndrome without complications of unspecified extremity: Secondary | ICD-10-CM

## 2011-06-22 LAB — PROTIME-INR
INR: 2.36 — ABNORMAL HIGH (ref 0.00–1.49)
Prothrombin Time: 26.2 seconds — ABNORMAL HIGH (ref 11.6–15.2)

## 2011-06-22 MED ORDER — WARFARIN SODIUM 10 MG PO TABS: ORAL_TABLET | ORAL | Status: DC

## 2011-06-22 MED ORDER — OXYCODONE HCL 5 MG PO TABA: 5.0000 mg | ORAL_TABLET | Freq: Four times a day (QID) | ORAL | Status: AC | PRN

## 2011-06-22 NOTE — Telephone Encounter (Signed)
See routing comment

## 2011-06-22 NOTE — Progress Notes (Signed)
Labs drawn today for pt.  Patient on 12.5mg  and 15mg  alt. Coud.  Call pt at [phone]

## 2011-06-22 NOTE — Progress Notes (Signed)
Spoke with pt instructed to cont same dose coumadin. Recheck pt level in 1 week.

## 2011-06-24 ENCOUNTER — Encounter (HOSPITAL_BASED_OUTPATIENT_CLINIC_OR_DEPARTMENT_OTHER): Payer: Self-pay | Attending: Plastic Surgery

## 2011-06-24 DIAGNOSIS — E039 Hypothyroidism, unspecified: Secondary | ICD-10-CM | POA: Insufficient documentation

## 2011-06-24 DIAGNOSIS — M7989 Other specified soft tissue disorders: Secondary | ICD-10-CM | POA: Insufficient documentation

## 2011-06-24 DIAGNOSIS — R234 Changes in skin texture: Secondary | ICD-10-CM | POA: Insufficient documentation

## 2011-06-24 DIAGNOSIS — F17200 Nicotine dependence, unspecified, uncomplicated: Secondary | ICD-10-CM | POA: Insufficient documentation

## 2011-06-24 DIAGNOSIS — Z7901 Long term (current) use of anticoagulants: Secondary | ICD-10-CM | POA: Insufficient documentation

## 2011-06-24 DIAGNOSIS — L988 Other specified disorders of the skin and subcutaneous tissue: Secondary | ICD-10-CM | POA: Insufficient documentation

## 2011-06-24 DIAGNOSIS — Z86718 Personal history of other venous thrombosis and embolism: Secondary | ICD-10-CM | POA: Insufficient documentation

## 2011-06-25 NOTE — Progress Notes (Signed)
[facility]  NAME:  Matthew Wise, Matthew Wise               ACCOUNT NO.:  [id number]  MEDICAL RECORD NO.:  [id number]      DATE OF BIRTH:  July 17, 1961  PHYSICIAN:  Wayland Denis, DO       VISIT DATE:  06/24/2011                                  OFFICE VISIT   CHIEF COMPLAINT:  Bilateral lower extremity swelling, worse on the right leg.  HISTORY OF PRESENT ILLNESS:  [name] is a 50 year old black male with a history of leg swelling.  He had a DVT in his left leg several years ago and more recently in his right leg.  He has been treated with Coumadin for this.  He has had intermittent swelling of his leg, likely related to venous insufficiency.  Venous studies were done about a month ago.  He was seen by vascular and vein specialist.  Elevation does help some, but not completely.  PAST MEDICAL HISTORY:  Positive for DVT, inferior vena cava thrombosis, and hypothyroidism.  He denies having had surgery.  He is on Coumadin.  No known drug allergies.  SOCIAL HISTORY:  Divorced.  Currently one pack-per-day smoker and the weekly alcohol.  History of drug use.  He denies any significant change in his weight or vision, difficulty breathing or chest pain.  PHYSICAL EXAMINATION:  GENERAL:  He is alert, oriented, cooperative, in no acute distress.  He is very pleasant. HEENT:  His pupils are equal.  Extraocular muscles intact. LYMPHATICS:  No cervical lymphadenopathy. VASCULAR:  Upper extremity pulses are strong and equal. RESPIRATORY:  Breathing is unlabored. HEART:  Rate and rhythm are regular. ABDOMEN:  No abdominal tenderness. EXTREMITIES:  Lower extremity swelling bilaterally, worse on the right. He has quite a bit of dry scaly woody skin on the medial aspect of his left lower extremity with a pinpoint opening on the skin.  Pulses are weak, but capillary refills adequate.  We recommend elevation, compression, antibiotic ointment to the left lower extremity, and  follow up in a week.  He would be a good candidate for bilateral home compression.  Unfortunately, this is expensive any he does have any insurance, but we did tell him about it.     Wayland Denis, DO     CS/MEDQ  D:  06/24/2011  T:  06/25/2011  Job:  [id number]

## 2011-06-26 NOTE — Telephone Encounter (Signed)
[place] Ana, Did you get up with Elijah Birk re: his Rxs?

## 2011-06-29 ENCOUNTER — Other Ambulatory Visit (HOSPITAL_COMMUNITY): Payer: Self-pay | Admitting: Oncology

## 2011-06-29 ENCOUNTER — Encounter (HOSPITAL_BASED_OUTPATIENT_CLINIC_OR_DEPARTMENT_OTHER): Payer: PRIVATE HEALTH INSURANCE | Admitting: Oncology

## 2011-06-29 ENCOUNTER — Encounter (HOSPITAL_COMMUNITY): Payer: PRIVATE HEALTH INSURANCE

## 2011-06-29 DIAGNOSIS — L0291 Cutaneous abscess, unspecified: Secondary | ICD-10-CM

## 2011-06-29 DIAGNOSIS — I82409 Acute embolism and thrombosis of unspecified deep veins of unspecified lower extremity: Secondary | ICD-10-CM

## 2011-06-29 DIAGNOSIS — Z7901 Long term (current) use of anticoagulants: Secondary | ICD-10-CM

## 2011-06-29 DIAGNOSIS — I2699 Other pulmonary embolism without acute cor pulmonale: Secondary | ICD-10-CM

## 2011-06-29 DIAGNOSIS — L0390 Cellulitis, unspecified: Secondary | ICD-10-CM

## 2011-06-29 MED ORDER — SULFAMETHOXAZOLE-TRIMETHOPRIM 800-160 MG PO TABS: 1.0000 | ORAL_TABLET | Freq: Two times a day (BID) | ORAL | Status: AC

## 2011-06-29 NOTE — Progress Notes (Signed)
[name]., MD [address] [place] [address]  1. H/O PE (pulmonary embolism)    2. DVT (deep venous thrombosis)    3. Cellulitis  sulfamethoxazole-trimethoprim (BACTRIM DS,SEPTRA DS) 800-160 MG per tablet    CURRENT THERAPY: On Coumadin anticoagulation  INTERVAL HISTORY: Matthew Wise 50 y.o. male returns for PT/INR check.  Patient reports an left LE "sore".  As a result I was asked to see the patient.  He reports a "sore" surfacing on his left LE a "few days ago".  He admits that it is slightly painful.  He denies any fevers or chills.    Past Medical History  Diagnosis Date  . DVT of leg (deep venous thrombosis)     rt. leg  . Inferior vena caval thrombosis     Chronic  . Hypothyroidism   . DVT (deep venous thrombosis) 06/15/2011  . H/O PE (pulmonary embolism) 06/15/2011  . 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      has no known allergies.  Matthew Wise does not currently have medications on file.  No past surgical history on file.  Denies any headaches, dizziness, fevers, chills, night sweats, nausea, vomiting, diarrhea, constipation.   PHYSICAL EXAMINATION  There were no vitals filed for this visit.  GENERAL:alert, healthy, no distress, well nourished, well developed, comfortable and cooperative SKIN: skin color, texture, turgor are normal, no rashes or significant lesions HEAD: Normocephalic EYES: normal EARS: External ears normal OROPHARYNX:mucous membranes are moist  NECK: trachea midline LYMPH:  not examined BREAST:not examined LUNGS: not examined HEART: not examined ABDOMEN:not examined BACK: not examined EXTREMITIES:positive findings:  edema of right lower extremity.  Left lower extremity reveals a inflamed area on the medial aspect  that displays some serosanguinous fluid.  It is slightly tender to the touch.  It is not warm.  Some hyperpigmentation noted in the area.   No purulent discharge. NEURO: alert & oriented x 3 with fluent speech, no focal motor/sensory deficits, gait normal   PENDING LABS: PT/INR    ASSESSMENT:  1. Low grade cellulitis of the left medial aspect of the lower extremity.  It is not warm to the touch.  Serosanguinous fluid noted.  No obvious abscess appreciated.  Slightly tender to the touch. 2. DVT 3. PE 4. On Coumadin anticoagulation   PLAN:  1. Recommended the patient purchase some "non-stick" dressings 2. Apply Neosporin or triple antibiotic ointment to the affected area. 3. Avoid compression stockings on that extremity. 4. Septra DS BID for 10 days. E-prescribed to [employer]. 5. Will return more frequently for PT/INR checks since he will be on antibiotics.   All questions were answered. The patient knows to call the clinic with any problems, questions or concerns. We can certainly see the patient much sooner if necessary.  I spent 15 minutes counseling the patient face to face. The total time spent in the appointment was 25 minutes.  KEFALAS,THOMAS

## 2011-06-29 NOTE — Progress Notes (Signed)
Labs drawn today for pt.  Patient on 12.5 and 15mg  alt.  Call patient at [phone]

## 2011-07-01 ENCOUNTER — Other Ambulatory Visit (HOSPITAL_COMMUNITY): Payer: Self-pay

## 2011-07-01 ENCOUNTER — Encounter (HOSPITAL_BASED_OUTPATIENT_CLINIC_OR_DEPARTMENT_OTHER): Payer: Self-pay | Attending: Plastic Surgery

## 2011-07-01 DIAGNOSIS — Z86718 Personal history of other venous thrombosis and embolism: Secondary | ICD-10-CM | POA: Insufficient documentation

## 2011-07-01 DIAGNOSIS — F17200 Nicotine dependence, unspecified, uncomplicated: Secondary | ICD-10-CM | POA: Insufficient documentation

## 2011-07-01 DIAGNOSIS — Z7901 Long term (current) use of anticoagulants: Secondary | ICD-10-CM | POA: Insufficient documentation

## 2011-07-01 DIAGNOSIS — M7989 Other specified soft tissue disorders: Secondary | ICD-10-CM | POA: Insufficient documentation

## 2011-07-01 DIAGNOSIS — R234 Changes in skin texture: Secondary | ICD-10-CM | POA: Insufficient documentation

## 2011-07-01 DIAGNOSIS — E039 Hypothyroidism, unspecified: Secondary | ICD-10-CM | POA: Insufficient documentation

## 2011-07-01 DIAGNOSIS — L988 Other specified disorders of the skin and subcutaneous tissue: Secondary | ICD-10-CM | POA: Insufficient documentation

## 2011-07-03 ENCOUNTER — Other Ambulatory Visit (HOSPITAL_COMMUNITY): Payer: Self-pay

## 2011-07-06 ENCOUNTER — Other Ambulatory Visit (HOSPITAL_COMMUNITY): Payer: Self-pay | Admitting: Oncology

## 2011-07-06 ENCOUNTER — Encounter (HOSPITAL_COMMUNITY): Payer: PRIVATE HEALTH INSURANCE | Attending: Oncology

## 2011-07-06 DIAGNOSIS — I82409 Acute embolism and thrombosis of unspecified deep veins of unspecified lower extremity: Secondary | ICD-10-CM | POA: Insufficient documentation

## 2011-07-06 LAB — PROTIME-INR
INR: 2.56 — ABNORMAL HIGH (ref 0.00–1.49)
Prothrombin Time: 27.9 seconds — ABNORMAL HIGH (ref 11.6–15.2)

## 2011-07-06 NOTE — Progress Notes (Signed)
Labs drawn today for pt. Patient on 12.5mg  and 15mg  alt.  Call patient at [phone]

## 2011-07-06 NOTE — Progress Notes (Signed)
Patient called to inform of Coumadin dose and to return to clinic Wednesday to recheck his PT/INR.  Patient verbalized understanding.

## 2011-07-08 ENCOUNTER — Other Ambulatory Visit (HOSPITAL_COMMUNITY): Payer: Self-pay | Admitting: Oncology

## 2011-07-08 ENCOUNTER — Encounter (HOSPITAL_COMMUNITY): Payer: PRIVATE HEALTH INSURANCE

## 2011-07-08 DIAGNOSIS — I82409 Acute embolism and thrombosis of unspecified deep veins of unspecified lower extremity: Secondary | ICD-10-CM

## 2011-07-08 NOTE — Progress Notes (Signed)
Labs drawn today for pt.  Patient on 12.5mg  and 15mg   Alternating.  Call patient at [phone]

## 2011-07-08 NOTE — Progress Notes (Deleted)
[facility]   History of Present Illness  Matthew Wise is a 50 y.o. male who presents with chief complaint: persistent right leg swelling. 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 He presents for follow up after his venous duplex and insufficiency duplex.  Physical Examination  There were no vitals filed for this visit.  General: A&O x 3, WDWN,   Pulmonary: Sym exp, good air movt, CTAB, no rales, rhonchi, & wheezing,   Cardiac: RRR, Nl S1, S2, no Murmurs, rubs or gallops,   Vascular:  Vessel  Right  Left   Radial  Palpable  Palpable   [place]  Palpable  Palpable   Brachial  Palpable  Palpable   Carotid  Palpable, without bruit  Palpable, without bruit   Aorta  Non-palpable  N/A   Femoral  Palpable  Palpable   Popliteal  Non-palpable  Non-palpable   PT  Palpable  Palpable   DP  Palpable  Palpable    Musculoskeletal: M/S 5/5 throughout, Extremities without ischemic changes, L leg: 2+ edema, R leg: 3+ pitting edema, Verrucous skin changes in L leg, previous healed VSU evident L medial malleolus, swollen B thighs   Neurologic: CN 2-12 intact, Pain and light touch intact in extremities, Motor exam as listed above   Non-Invasive Vascular Imaging  BLE Venous Duplex (Date: ***)  BLE Venous Insufficiency Duplex (Date: ***)  Medical Decision Making  Matthew Wise is a 50 y.o. 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  Based on this patient's history and examination, I have offered this patient: *** Thank you for allowing [place] to participate in this patient's care.  Leonides Sake, MD  [facility]  Office: [phone]  Pager: [phone]

## 2011-07-10 ENCOUNTER — Other Ambulatory Visit (HOSPITAL_COMMUNITY): Payer: Self-pay

## 2011-07-10 ENCOUNTER — Encounter: Payer: Self-pay | Admitting: Vascular Surgery

## 2011-07-13 NOTE — Progress Notes (Signed)
This encounter was created in error - please disregard.

## 2011-07-15 ENCOUNTER — Encounter (HOSPITAL_COMMUNITY): Payer: PRIVATE HEALTH INSURANCE

## 2011-07-15 ENCOUNTER — Other Ambulatory Visit (HOSPITAL_COMMUNITY): Payer: Self-pay | Admitting: Oncology

## 2011-07-15 DIAGNOSIS — I82409 Acute embolism and thrombosis of unspecified deep veins of unspecified lower extremity: Secondary | ICD-10-CM

## 2011-07-15 LAB — PROTIME-INR: Prothrombin Time: 18.2 seconds — ABNORMAL HIGH (ref 11.6–15.2)

## 2011-07-15 NOTE — Progress Notes (Signed)
Labs drawn today for pt,  Patient on 15mg  and 12.5 mg alternating,  Call patient at [phone].  Patient stated he thinks he missed Tuesday dose.

## 2011-07-16 ENCOUNTER — Encounter (HOSPITAL_BASED_OUTPATIENT_CLINIC_OR_DEPARTMENT_OTHER): Payer: PRIVATE HEALTH INSURANCE | Admitting: Oncology

## 2011-07-16 VITALS — BP 133/85 | Temp 98.9°F | Wt 286.0 lb

## 2011-07-16 DIAGNOSIS — Q269 Congenital malformation of great vein, unspecified: Secondary | ICD-10-CM

## 2011-07-16 DIAGNOSIS — I87009 Postthrombotic syndrome without complications of unspecified extremity: Secondary | ICD-10-CM

## 2011-07-16 DIAGNOSIS — I871 Compression of vein: Secondary | ICD-10-CM

## 2011-07-16 DIAGNOSIS — I2699 Other pulmonary embolism without acute cor pulmonale: Secondary | ICD-10-CM

## 2011-07-16 DIAGNOSIS — I82409 Acute embolism and thrombosis of unspecified deep veins of unspecified lower extremity: Secondary | ICD-10-CM

## 2011-07-16 NOTE — Patient Instructions (Signed)
[facility]  Discharge Instructions  RECOMMENDATIONS MADE BY THE CONSULTANT AND ANY TEST RESULTS WILL BE SENT TO YOUR REFERRING DOCTOR.   EXAM FINDINGS BY MD TODAY AND SIGNS AND SYMPTOMS TO REPORT TO CLINIC OR PRIMARY MD:   Return for PT/INR next Wednesday. Return in 6 weeks to see Tom.     I acknowledge that I have been informed and understand all the instructions given to me and received a copy. I do not have any more questions at this time, but understand that I may call the Specialty Clinic at [facility] at [phone] during business hours should I have any further questions or need assistance in obtaining follow-up care.    __________________________________________  _____________  __________ Signature of Patient or Authorized Representative            Date                   Time    __________________________________________ Nurse's Signature

## 2011-07-16 NOTE — Progress Notes (Signed)
[name]., MD [address] [place] [address]  1. DVT (deep venous thrombosis)  Protime-INR  2. Congenital inferior vena cava interruption    3. H/O PE (pulmonary embolism)    4. IVC obstruction    5. Post-phlebitic syndrome      CURRENT THERAPY:Coumadin anticoagulation, likely life-long  INTERVAL HISTORY: Matthew Wise 50 y.o. male returns for  regular  visit for followup of DVT.  He reports that his right leg pain is achy with occasionally increased intensity of pain, secondary to DVT and associated edema.  The patient's right LE edema is improved.  He is wearing the compression stocking as ordered.    The patient's left LE cellulitis is improved and resolved following antibiotic therapy.  There is a slight area of hyper pigmentation.  The patient explains that he has missed a few doses of his coumadin.  Due to his most recent INR of 1.48, the patient will continue to take his Coumadin as directed and we will get a PT/INR in 6 days.  The patient denies any complaints today.  He denies any chest pains or sudden onset of shortness of breath.  Past Medical History  Diagnosis Date  . DVT of leg (deep venous thrombosis)     rt. leg  . Inferior vena caval thrombosis     Chronic  . Hypothyroidism   . DVT (deep venous thrombosis) 06/15/2011  . H/O PE (pulmonary embolism) 06/15/2011  . 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      has no known allergies.  Mr. Cathey does not currently have medications on file.  No past surgical history on file.  Denies any headaches, dizziness, double vision, fevers, chills, night sweats, nausea, vomiting, diarrhea, constipation, chest pain, heart palpitations, shortness of breath, blood in stool, black tarry stool, urinary pain,  urinary burning, urinary frequency, hematuria.   PHYSICAL EXAMINATION  ECOG PERFORMANCE STATUS: 1 - Symptomatic but completely ambulatory  Filed Vitals:   07/16/11 1132  BP: 133/85  Temp: 98.9 F (37.2 C)    GENERAL:alert, healthy, no distress, well nourished, well developed, comfortable, cooperative and smiling SKIN: skin color, texture, turgor are normal, no rashes or significant lesions HEAD: Normocephalic EYES: normal EARS: External ears normal OROPHARYNX:mucous membranes are moist  NECK: trachea midline LYMPH:  No epitrochlear nodes noted BREAST:not examined LUNGS: clear to auscultation and percussion HEART: regular rate & rhythm, no murmurs, no gallops, S1 normal and S2 normal ABDOMEN:abdomen soft, non-tender, obese and normal bowel sounds BACK: Back symmetric, no curvature., No CVA tenderness EXTREMITIES:less then 2 second capillary refill, no joint deformities, effusion, or inflammation, no clubbing, no cyanosis, positive findings:  edema 1-2 + pitting edema in right LE > Left.  Left leg varicosities noted.  Edema is much improved on the right.  NEURO: alert & oriented x 3 with fluent speech, no focal motor/sensory deficits, gait normal    LABORATORY DATA: Lab Results  Component Value Date   INR 1.48 07/15/2011   INR 2.63* 07/08/2011   INR 2.56* 07/06/2011   ASSESSMENT:  1. B/L DVT in LE. R>L  2. Right 2+ LE edema, much improved 3. Woody infiltration of R LE, much improved  4. R LE tightness, much improved 5. H/O recurrent DVT  6. Small PE  7. 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  8. R LE discomfort when ambulating, improved 9. Left LE varicosities.    PLAN:  1. Continue Coumadin anticoagulation as directed 2. PT/INR on Wednesday 3. Return in 6 weeks for follow-up   All questions were answered. The patient knows to call the clinic with any problems, questions or concerns. We can certainly see the patient much sooner if  necessary.   KEFALAS,THOMAS

## 2011-07-20 ENCOUNTER — Other Ambulatory Visit (HOSPITAL_COMMUNITY): Payer: Self-pay

## 2011-07-22 ENCOUNTER — Encounter (HOSPITAL_COMMUNITY): Payer: PRIVATE HEALTH INSURANCE

## 2011-07-22 DIAGNOSIS — I82409 Acute embolism and thrombosis of unspecified deep veins of unspecified lower extremity: Secondary | ICD-10-CM

## 2011-07-22 DIAGNOSIS — I87009 Postthrombotic syndrome without complications of unspecified extremity: Secondary | ICD-10-CM

## 2011-07-22 LAB — PROTIME-INR: Prothrombin Time: 27.6 seconds — ABNORMAL HIGH (ref 11.6–15.2)

## 2011-07-22 NOTE — Progress Notes (Signed)
Labs drawn today for pt.  Patient on 12.5mg and 15mg alternating.  Call patient at [phone] 

## 2011-07-23 ENCOUNTER — Telehealth (HOSPITAL_COMMUNITY): Payer: Self-pay | Admitting: *Deleted

## 2011-07-23 ENCOUNTER — Other Ambulatory Visit (HOSPITAL_COMMUNITY): Payer: Self-pay | Admitting: *Deleted

## 2011-07-23 DIAGNOSIS — I82409 Acute embolism and thrombosis of unspecified deep veins of unspecified lower extremity: Secondary | ICD-10-CM

## 2011-07-23 NOTE — Telephone Encounter (Signed)
Spoke with pt's sister Pattricia Boss. She took message for pt to continue same dose Coumadin 12.5mg  alt 15mg  every other day and RTC in 1week 8/29 at 840 for PT level.

## 2011-07-23 NOTE — Telephone Encounter (Signed)
Message copied by Dennie Maizes on Thu Jul 23, 2011  8:46 AM ------      Message from: Mariel Sleet, ERIC S      Created: Wed Jul 22, 2011  4:17 PM       What is his dose of coumadin?      Same dose for now and INR 6-7 days.

## 2011-07-27 ENCOUNTER — Encounter: Payer: Self-pay | Admitting: Vascular Surgery

## 2011-07-29 ENCOUNTER — Encounter (HOSPITAL_COMMUNITY): Payer: PRIVATE HEALTH INSURANCE

## 2011-07-29 ENCOUNTER — Other Ambulatory Visit (HOSPITAL_COMMUNITY): Payer: Self-pay | Admitting: Oncology

## 2011-07-29 ENCOUNTER — Other Ambulatory Visit (HOSPITAL_COMMUNITY): Payer: Self-pay | Admitting: *Deleted

## 2011-07-29 ENCOUNTER — Telehealth (HOSPITAL_COMMUNITY): Payer: Self-pay | Admitting: *Deleted

## 2011-07-29 DIAGNOSIS — I82409 Acute embolism and thrombosis of unspecified deep veins of unspecified lower extremity: Secondary | ICD-10-CM

## 2011-07-29 LAB — PROTIME-INR
INR: 1.54 — ABNORMAL HIGH (ref 0.00–1.49)
Prothrombin Time: 18.8 seconds — ABNORMAL HIGH (ref 11.6–15.2)

## 2011-07-29 NOTE — Telephone Encounter (Signed)
Message copied by Dennie Maizes on Wed Jul 29, 2011  2:19 PM ------      Message from: Mariel Sleet, ERIC S      Created: Wed Jul 29, 2011  1:16 PM       So, ask him to not miss any more doses and repeat 7 days.

## 2011-07-29 NOTE — Progress Notes (Signed)
Matthew Wise presented for labwork. Labs per MD order drawn via Peripheral Line23 gauge needle inserted in lt ac.  Procedure without incident.   Patient tolerated procedure well.  Coumadin 12.5mg  alt with 15mg  every other day. Reports he has missed at least 1-2 doses.

## 2011-07-29 NOTE — Telephone Encounter (Signed)
Message left on mobile voicemail to continue same dose Coumadin rtc in 1 week for pt/inr

## 2011-08-05 ENCOUNTER — Encounter (HOSPITAL_COMMUNITY): Payer: PRIVATE HEALTH INSURANCE | Attending: Oncology

## 2011-08-05 ENCOUNTER — Other Ambulatory Visit (HOSPITAL_COMMUNITY): Payer: Self-pay | Admitting: Oncology

## 2011-08-05 DIAGNOSIS — I82409 Acute embolism and thrombosis of unspecified deep veins of unspecified lower extremity: Secondary | ICD-10-CM

## 2011-08-05 LAB — PROTIME-INR: INR: 1.67 — ABNORMAL HIGH (ref 0.00–1.49)

## 2011-08-05 NOTE — Progress Notes (Signed)
Labs drawn for pt.  Patient on 12.5mg  and 15mg .  Call patient at [phone]

## 2011-08-12 ENCOUNTER — Other Ambulatory Visit (HOSPITAL_COMMUNITY): Payer: Self-pay | Admitting: Oncology

## 2011-08-12 ENCOUNTER — Telehealth (HOSPITAL_COMMUNITY): Payer: Self-pay

## 2011-08-12 ENCOUNTER — Encounter (HOSPITAL_BASED_OUTPATIENT_CLINIC_OR_DEPARTMENT_OTHER): Payer: PRIVATE HEALTH INSURANCE

## 2011-08-12 DIAGNOSIS — I82409 Acute embolism and thrombosis of unspecified deep veins of unspecified lower extremity: Secondary | ICD-10-CM

## 2011-08-12 LAB — PROTIME-INR: INR: 2.54 — ABNORMAL HIGH (ref 0.00–1.49)

## 2011-08-12 MED ORDER — OXYCODONE HCL 5 MG PO CAPS: 10.0000 mg | ORAL_CAPSULE | ORAL | Status: DC | PRN

## 2011-08-12 NOTE — Progress Notes (Signed)
Labs drawn today for pt.  Patient on 12.5mg  and 15mg  alt. Call patient at [phone]

## 2011-08-12 NOTE — Telephone Encounter (Signed)
Message left with mother - To continue same dosage of coumadin and RTC 08/19/11 @ 9:30 for PT level.

## 2011-08-19 ENCOUNTER — Encounter (HOSPITAL_COMMUNITY): Payer: PRIVATE HEALTH INSURANCE

## 2011-08-19 ENCOUNTER — Other Ambulatory Visit (HOSPITAL_COMMUNITY): Payer: Self-pay | Admitting: Oncology

## 2011-08-19 ENCOUNTER — Telehealth (HOSPITAL_COMMUNITY): Payer: Self-pay | Admitting: *Deleted

## 2011-08-19 ENCOUNTER — Ambulatory Visit (HOSPITAL_COMMUNITY)
Admission: RE | Admit: 2011-08-19 | Discharge: 2011-08-19 | Disposition: A | Discharge status: Home / Self Care | Payer: PRIVATE HEALTH INSURANCE | Source: Ambulatory Visit | Attending: Oncology | Admitting: Oncology

## 2011-08-19 DIAGNOSIS — IMO0001 Reserved for inherently not codable concepts without codable children: Secondary | ICD-10-CM | POA: Insufficient documentation

## 2011-08-19 DIAGNOSIS — L97809 Non-pressure chronic ulcer of other part of unspecified lower leg with unspecified severity: Secondary | ICD-10-CM | POA: Insufficient documentation

## 2011-08-19 DIAGNOSIS — I872 Venous insufficiency (chronic) (peripheral): Secondary | ICD-10-CM | POA: Insufficient documentation

## 2011-08-19 DIAGNOSIS — I82409 Acute embolism and thrombosis of unspecified deep veins of unspecified lower extremity: Secondary | ICD-10-CM

## 2011-08-19 DIAGNOSIS — IMO0002 Reserved for concepts with insufficient information to code with codable children: Secondary | ICD-10-CM

## 2011-08-19 LAB — PROTIME-INR
INR: 2.23 — ABNORMAL HIGH (ref 0.00–1.49)
Prothrombin Time: 25.1 seconds — ABNORMAL HIGH (ref 11.6–15.2)

## 2011-08-19 NOTE — Patient Instructions (Addendum)
Use compression on LE to decrease swelling; accelerate healing

## 2011-08-19 NOTE — Telephone Encounter (Signed)
Left message with pt's sister Pattricia Boss. Pt to continue same dose Coumadin,RTC 9/28 for pt/inr. Verbalizes understanding.

## 2011-08-19 NOTE — Telephone Encounter (Signed)
Message copied by Dennie Maizes on Wed Aug 19, 2011 11:50 AM ------      Message from: Ellouise Newer III      Created: Wed Aug 19, 2011 10:46 AM       Same dose            PT/INR in 10 days

## 2011-08-19 NOTE — Progress Notes (Signed)
Physical Therapy Evaluation  Patient Details  Name: Matthew Wise MRN: [id number] Date of Birth: 1961-06-03  Today's Date: 08/19/2011 Time: 1125-1210 Time Calculation (min): 45 min Visit#: 1  of 8   Re-eval: 09/18/11    Past Medical History:  Past Medical History  Diagnosis Date  . DVT of leg (deep venous thrombosis)     rt. leg  . Inferior vena caval thrombosis     Chronic  . Hypothyroidism   . DVT (deep venous thrombosis) 06/15/2011  . H/O PE (pulmonary embolism) 06/15/2011  . 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  Subjective Symptoms/Limitations Symptoms: Pt states that he started to have pain in his lower leg for about a month now. He states he noticed increased swelling and an open wound on the medial and posterior aspect.  He continued to dress the areas but they do not seem to be healing.  The patient states that last year he had a similar incident and did not go to the MD right away.  He states his wound continued to worsen therefore he wanted to get in to be seen as soon as possible to try and heal the wound ASAP Limitations: Standing;Walking How long can you sit comfortably?: no problem in a recliner.  30-45 minutes in a straight back chair. How long can you stand comfortably?: Pt states he has immediate increased pain as soon as he stands up but he can tolerate up to ten minutes. How long can you walk comfortably?:  Pt states he has immediate increased pain as soon as he stands up but he can tolerate walking up to ten minutes. Pain Assessment Currently in Pain?: Yes Pain Score:   2 (goes up to a 7 when wb.) Pain Location: Leg Pain Orientation: Left;Medial;Lower Pain Type: Acute pain Pain Onset: 1 to 4 weeks ago Pain Frequency: Constant (increases with WB)  Precautions/Restrictions   limit dependent position.  Prior Functioning   Independent in all aspects  Cognition   wnl Sensation/Coordination/Flexibility  very tender to wound area  Assessment  non-healing venous insufficient wound.  Mobility (including Balance)   ambulatory with increased pain    Exercise/Treatments Wound care to two areas medial aspect of L LE 4x4 cm with multiple wounds throughout.  A second area is along the posterior aspect of the patients LE which is 1.5 cm diameter.  Both wounds are draining minimally.    Physical Therapy Assessment and Plan PT Assessment and Plan Clinical Impression Statement: Non-healing venous insufficent wounds. Rehab Potential: Good PT Frequency: Min 2X/week PT Duration: 4 weeks PT Treatment/Interventions: Patient/family education (debridement, dressing,vaseline,4x4,kerlix, coban) PT Plan: see 2x/week for 4 weeks.    Goals PT Short Term Goals Time to Complete Short Term Goals: 2 weeks PT Short Term Goal 1: Pt wound to have scant drainage; 75% epithelization PT Short Term Goal 2: Pt to verbalize the importance of compression stockings. PT Long Term Goals PT Long Term Goal 1: 4 wk- wounds to be healed PT Long Term Goal 2: Pt to state that he can by wb for 30 minutes without any increased pain.-4 wk  Problem List Patient Active Problem List  Diagnoses  . Post-phlebitic syndrome  . IVC obstruction  . DVT (deep venous thrombosis)  . H/O PE (pulmonary embolism)  . Congenital inferior vena cava interruption  . Leg wound, left    PT - End of Session Activity Tolerance: Patient tolerated treatment well General Behavior During  Session: [facility] for tasks performed Cognition: [facility] for tasks performed   Ajeenah Heiny,CINDY 08/19/2011, 12:25 PM  Physician Documentation Your signature is required to indicate approval of the treatment plan as stated above.  Please sign and either send electronically or make a copy of this report for your files and return this physician signed original.   Please mark one 1.__approve of plan  2. ___approve of plan with the  following conditions.   ______________________________                                                          _____________________ Physician Signature                                                                                                             Date

## 2011-08-19 NOTE — Progress Notes (Signed)
Labs drawn today for pt.  Patient on 12.5mg  and 15mg  alternating.  Call patient at [phone]

## 2011-08-21 ENCOUNTER — Ambulatory Visit (HOSPITAL_COMMUNITY)
Admission: RE | Admit: 2011-08-21 | Discharge: 2011-08-21 | Disposition: A | Discharge status: Home / Self Care | Payer: PRIVATE HEALTH INSURANCE | Source: Ambulatory Visit | Attending: Physical Therapy | Admitting: Physical Therapy

## 2011-08-21 NOTE — Progress Notes (Signed)
Physical Therapy - Wound Therapy  Patient Details  Name: Matthew Wise MRN: [id number] Date of Birth: May 30, 1961  Today's Date: 08/21/2011 Time: 1205-1230 Time Calculation (min): 25 min Visit#: 2 of 8 Re-eval: 09/18/11 Charge: Selective debridement <20 cm  Wound Therapy   Debridement, irrigation, and dressing complete.  Physical Therapy Assessment and Plan   PT Assessment and Plan Clinical Impression Statement: No draining.  Debridement/irrigation and dressing complete to L anterior and posterior LE. PT Plan: Continue with current POC.    Goals    Problem List Patient Active Problem List  Diagnoses  . Post-phlebitic syndrome  . IVC obstruction  . DVT (deep venous thrombosis)  . H/O PE (pulmonary embolism)  . Congenital inferior vena cava interruption  . Leg wound, left    Juel Burrow 08/21/2011, 1:22 PM

## 2011-08-25 ENCOUNTER — Ambulatory Visit (HOSPITAL_COMMUNITY): Payer: PRIVATE HEALTH INSURANCE | Admitting: Physical Therapy

## 2011-08-27 ENCOUNTER — Ambulatory Visit (HOSPITAL_COMMUNITY)
Admission: RE | Admit: 2011-08-27 | Discharge: 2011-08-27 | Disposition: A | Discharge status: Home / Self Care | Payer: PRIVATE HEALTH INSURANCE | Source: Ambulatory Visit | Attending: Internal Medicine | Admitting: Internal Medicine

## 2011-08-27 NOTE — Progress Notes (Signed)
Physical Therapy - Wound Therapy  Patient Details  Name: Matthew Wise MRN: [id number] Date of Birth: 1961/11/25  Today's Date: 08/27/2011 Time: 1106-1140 Time Calculation (min): 34 min Visit#: 3 of 8 Re-eval: 09/18/11 Wound site:  L distal LE, vascular compromise Charges:  Debridement < 20cm  Subjective:  No pain unless you dig into the wound.  No pain otherwise.  Pt. Expressed interest in getting thigh high compression garments, as his knee high ones cut into the leg by the end of the day.  Pt. Given lymphedema website to order stockings at a discount.  Advised pt to get L LE measured by [facility] in [place] or [address] in [place].  Objective:  Pt. Presented to therapy today without bandaging on L LE.  Drainage amount unknown.  Pt with additional wound approximately 2cm2 on posterior calf.  Wound cleansed with carraklenz and debrided with forceps.  All dry skin removed from wounds with 100% granulation beneath.  Wound healing is compromised by integrity of perimeter of wound, as it appears fungal in nature.   Wounds dressed with hydrogel infused 2X2's, kling and coban applied from base of metatarsals to patellar tendon to promote circulation/decrease swelling.      Physical Therapy Assessment and Plan   PT Assessment and Plan Clinical Impression Statement: Pt came to therapy without dressing on LLE today.  Overall decreased swelling but with new wound post. calf, approx 2cm2 in size.  Wounds debrided and dressed as last visit. PT Treatment/Interventions:  Gladis Riffle) PT Plan: Continue to progress toward wound closure.  May want to try an antifungal cream on perimeter of L LE versus vaseline as it continues to make area moist and prone to fungal growth.    Problem List Patient Active Problem List  Diagnoses  . Post-phlebitic syndrome  . IVC obstruction  . DVT (deep venous thrombosis)  . H/O PE (pulmonary embolism)  . Congenital inferior vena cava interruption  .  Leg wound, left    Lurena Nida 08/27/2011, 11:52 AM

## 2011-08-28 ENCOUNTER — Telehealth (HOSPITAL_COMMUNITY): Payer: Self-pay

## 2011-08-28 ENCOUNTER — Encounter (HOSPITAL_COMMUNITY): Payer: PRIVATE HEALTH INSURANCE

## 2011-08-28 ENCOUNTER — Other Ambulatory Visit (HOSPITAL_COMMUNITY): Payer: Self-pay | Admitting: Oncology

## 2011-08-28 DIAGNOSIS — I82409 Acute embolism and thrombosis of unspecified deep veins of unspecified lower extremity: Secondary | ICD-10-CM

## 2011-08-28 MED ORDER — OXYCODONE HCL 5 MG PO CAPS: 10.0000 mg | ORAL_CAPSULE | ORAL | Status: DC | PRN

## 2011-08-28 NOTE — Telephone Encounter (Signed)
Notes Recorded by Dellis Anes, PA on 08/28/2011 at 10:53 AM Same dose  PT/INR in 10 days   Patient notified./K.Mercy Riding, RN

## 2011-08-28 NOTE — Progress Notes (Unsigned)
Matthew Wise presented for labwork. Labs per MD order drawn via Peripheral Line 23 gauge needle inserted in right AC  Good blood return present. Procedure without incident.  Needle removed intact. Patient tolerated procedure well.  Taking coumadin 12.5 mg alternating with 15 mg.  No missed doses per patient.

## 2011-09-01 ENCOUNTER — Ambulatory Visit (HOSPITAL_COMMUNITY)
Admission: RE | Admit: 2011-09-01 | Discharge: 2011-09-01 | Disposition: A | Discharge status: Home / Self Care | Payer: PRIVATE HEALTH INSURANCE | Source: Ambulatory Visit | Attending: Oncology | Admitting: Oncology

## 2011-09-01 DIAGNOSIS — IMO0001 Reserved for inherently not codable concepts without codable children: Secondary | ICD-10-CM | POA: Insufficient documentation

## 2011-09-01 DIAGNOSIS — L97809 Non-pressure chronic ulcer of other part of unspecified lower leg with unspecified severity: Secondary | ICD-10-CM | POA: Insufficient documentation

## 2011-09-01 DIAGNOSIS — I872 Venous insufficiency (chronic) (peripheral): Secondary | ICD-10-CM | POA: Insufficient documentation

## 2011-09-01 NOTE — Progress Notes (Signed)
Physical Therapy - Wound Therapy  Patient Details  Name: Matthew Wise MRN: [id number] Date of Birth: 1961/08/24  Today's Date: 09/01/2011 Time: 4098-1191 Time Calculation (min): 30 min Visit#: 4 of 8 Re-eval: 09/18/11 Charges: Selective debridement (= or < 20 cm)  Objective:Pt reports his pain is not bad.Pain 2/10 in LLE. Wound Therapy Pt. Presented to therapy today without bandaging on L LE. Drainage amount unknown.  Wounds cleansed with carraklenz and debrided with forceps. All dry skin removed from wounds with 100% granulation beneath. Wounds dressed with hydrogel infused 2X2's, kling and coban applied from base of metatarsals to patellar tendon to promote circulation/decrease swelling. Coban covered with size 9 netting.   Physical Therapy Assessment and Plan   PT Assessment and Plan Clinical Impression Statement: Pt ambulates into therapy without dressing. Pt educated on the importance of having a moist environment to promote wound healing. Wound with increased granulation after debridement. Pt reports no increase in pain at end of session. PT Treatment/Interventions: Other (comment) (woundcare) PT Plan: Continue to progress per PT POC.     Problem List Patient Active Problem List  Diagnoses  . Post-phlebitic syndrome  . IVC obstruction  . DVT (deep venous thrombosis)  . H/O PE (pulmonary embolism)  . Congenital inferior vena cava interruption  . Leg wound, left    Antonieta Iba 09/01/2011, 11:51 AM

## 2011-09-03 ENCOUNTER — Encounter (HOSPITAL_COMMUNITY): Payer: PRIVATE HEALTH INSURANCE | Attending: Oncology | Admitting: Oncology

## 2011-09-03 ENCOUNTER — Telehealth (HOSPITAL_COMMUNITY): Payer: Self-pay

## 2011-09-03 VITALS — BP 126/80 | HR 69 | Temp 98.2°F | Ht 72.0 in | Wt 278.0 lb

## 2011-09-03 DIAGNOSIS — I82409 Acute embolism and thrombosis of unspecified deep veins of unspecified lower extremity: Secondary | ICD-10-CM | POA: Insufficient documentation

## 2011-09-03 LAB — CBC
HCT: 51.8 % (ref 39.0–52.0)
Hemoglobin: 15.6 g/dL (ref 13.0–17.0)
Hemoglobin: 16.1 g/dL (ref 13.0–17.0)
MCHC: 33.4 g/dL (ref 30.0–36.0)
MCHC: 33.5 g/dL (ref 30.0–36.0)
MCHC: 33.8 g/dL (ref 30.0–36.0)
MCV: 88.3 fL (ref 78.0–100.0)
MCV: 88.7 fL (ref 78.0–100.0)
Platelets: 178 10*3/uL (ref 150–400)
RBC: 5.29 MIL/uL (ref 4.22–5.81)
RDW: 13.7 % (ref 11.5–15.5)
RDW: 13.7 % (ref 11.5–15.5)

## 2011-09-03 LAB — BASIC METABOLIC PANEL
BUN: 9 mg/dL (ref 6–23)
CO2: 26 mEq/L (ref 19–32)
CO2: 28 mEq/L (ref 19–32)
Calcium: 9 mg/dL (ref 8.4–10.5)
Chloride: 105 mEq/L (ref 96–112)
Creatinine, Ser: 1.11 mg/dL (ref 0.4–1.5)
GFR calc Af Amer: >60 mL/min (ref >60)
GFR calc non Af Amer: >60 mL/min (ref >60)
Glucose, Bld: 89 mg/dL (ref 70–99)
Glucose, Bld: 98 mg/dL (ref 70–99)
Potassium: 3.6 mEq/L (ref 3.5–5.1)
Sodium: 137 mEq/L (ref 135–145)

## 2011-09-03 LAB — CULTURE, BLOOD (ROUTINE X 2): Culture: NO GROWTH

## 2011-09-03 LAB — DIFFERENTIAL
Basophils Absolute: 0 10*3/uL (ref 0.0–0.1)
Basophils Absolute: 0 10*3/uL (ref 0.0–0.1)
Basophils Relative: 0 % (ref 0–1)
Basophils Relative: 0 % (ref 0–1)
Basophils Relative: 0 % (ref 0–1)
Eosinophils Absolute: 0.1 10*3/uL (ref 0.0–0.7)
Eosinophils Absolute: 0.1 10*3/uL (ref 0.0–0.7)
Eosinophils Relative: 1 % (ref 0–5)
Eosinophils Relative: 2 % (ref 0–5)
Lymphocytes Relative: 15 % (ref 12–46)
Monocytes Absolute: 0.6 10*3/uL (ref 0.1–1.0)
Monocytes Absolute: 0.7 10*3/uL (ref 0.1–1.0)
Monocytes Relative: 10 % (ref 3–12)
Neutro Abs: 3.1 10*3/uL (ref 1.7–7.7)

## 2011-09-03 LAB — APTT: aPTT: 39 seconds — ABNORMAL HIGH (ref 24–37)

## 2011-09-03 LAB — PROTIME-INR: Prothrombin Time: 12.5 seconds (ref 11.6–15.2)

## 2011-09-03 NOTE — Progress Notes (Signed)
Matthew Wise., MD [address] [place] [address]  1. DVT (deep venous thrombosis)     CURRENT THERAPY: On Coumadin anticoagulation  INTERVAL HISTORY: Matthew Wise 50 y.o. male returns for  regular  visit for followup of DVT.  He denies any complaints.  He remains unable to work at the present moment and therefore I gave him an Rx explaining that he is unable to work.    He reports that his right leg is improving in regards to the swelling.  He is able to put on his jeans easier.    He does have an ulcer on the medial side of his  Left leg that is being followed and treated by the wound center.  He denies any bleeding.  Past Medical History  Diagnosis Date  . DVT of leg (deep venous thrombosis)     rt. leg  . Inferior vena caval thrombosis     Chronic  . Hypothyroidism   . DVT (deep venous thrombosis) 06/15/2011  . H/O PE (pulmonary embolism) 06/15/2011  . 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      has no known allergies.  Matthew Wise does not currently have medications on file.  No past surgical history on file.  Denies any headaches, dizziness, double vision, fevers, chills, night sweats, nausea, vomiting, diarrhea, constipation, chest pain, heart palpitations, shortness of breath, blood in stool, black tarry stool, urinary pain, urinary burning, urinary frequency, hematuria.   PHYSICAL EXAMINATION  ECOG PERFORMANCE STATUS: 2 - Symptomatic, <50% confined to bed  Filed Vitals:   09/03/11 1149  BP: 126/80  Pulse: 69  Temp: 98.2 F (36.8 C)    GENERAL:alert, no distress, well nourished, well developed, comfortable, cooperative and smiling SKIN: skin color, texture, turgor are normal HEAD: Normocephalic EYES: normal EARS:  External ears normal OROPHARYNX:mucous membranes are moist  NECK: trachea midline LYMPH:  not examined BREAST:not examined LUNGS: clear to auscultation and percussion HEART: regular rate & rhythm, no murmurs, no gallops, S1 normal and S2 normal ABDOMEN:abdomen soft, non-tender and normal bowel sounds BACK: Back symmetric, no curvature. EXTREMITIES:less then 2 second capillary refill, no joint deformities, effusion, or inflammation, no skin discoloration, no clubbing, no cyanosis, positive findings:  edema left LE 1+ pitting edema.  Left LE remains tight but much less than on previous exam.  Right medial side of LE reveals a dressing covering an ulcer that is being treated by wound center.  NEURO: alert & oriented x 3 with fluent speech, no focal motor/sensory deficits, gait normal   LABORATORY DATA: Lab Results  Component Value Date   INR 2.21* 08/28/2011   INR 2.23* 08/19/2011   INR 2.54* 08/12/2011     ASSESSMENT:  1. DVT, on anticoagulation  PLAN:  1. Continue coumadin as directed. 2. PT/INR on Monday 3. Return in 6 weeks for follow-up 4. Rx given explaining that he remains unable to work.   All questions were answered. The patient knows to call the clinic with any problems, questions or concerns. We can certainly see the patient much sooner if necessary.   KEFALAS,THOMAS

## 2011-09-04 ENCOUNTER — Ambulatory Visit: Payer: Self-pay | Admitting: Vascular Surgery

## 2011-09-07 ENCOUNTER — Other Ambulatory Visit (HOSPITAL_COMMUNITY): Payer: PRIVATE HEALTH INSURANCE

## 2011-09-07 ENCOUNTER — Ambulatory Visit (HOSPITAL_COMMUNITY)
Admission: RE | Admit: 2011-09-07 | Discharge: 2011-09-07 | Disposition: A | Discharge status: Home / Self Care | Payer: PRIVATE HEALTH INSURANCE | Source: Ambulatory Visit | Attending: *Deleted | Admitting: *Deleted

## 2011-09-07 NOTE — Progress Notes (Signed)
Physical Therapy Treatment Patient Details  Name: Matthew Wise MRN: [id number] Date of Birth: [birth year]  Today's Date: 09/07/2011 Time: 0803-0828 Time Calculation (min): 25 min Visit#: 5  of 8   Re-eval: 09/18/11 Charges:  Debridement <20cm    Subjective: Symptoms/Limitations Symptoms: Pt. states he's been keeping the bandage on; bandage was off on arrival. Pain Assessment Currently in Pain?: No/denies  Objective: Pt. Reports no pain today, stinging in the evenings but OK right now.  Woundcare: Pt. Presented to therapy today without bandaging on L LE. Drainage amount unknown. Wounds cleansed with carraklenz and debrided with forceps. All dry skin removed from wounds with 100% granulation beneath. Wounds dressed with hydrogel infused 2X2's, kling and coban applied from base of metatarsals to patellar tendon to promote circulation/decrease swelling. Coban covered with size 9 netting.    Physical Therapy Assessment and Plan PT Assessment and Plan Clinical Impression Statement: Pt. encouraged to try an antifungal cream on wound perimeter to decrease irritated skin.  Opened areas appear to be healing/decreasing in size. PT Treatment/Interventions:  Gladis Riffle) PT Plan: Continue with woundcare and compression therapy.     Problem List Patient Active Problem List  Diagnoses  . Post-phlebitic syndrome  . IVC obstruction  . DVT (deep venous thrombosis)  . H/O PE (pulmonary embolism)  . Congenital inferior vena cava interruption  . Leg wound, left    PT - End of Session Activity Tolerance: Patient tolerated treatment well General Behavior During Session: [facility] for tasks performed Cognition: [facility] for tasks performed  Emeline Gins B 09/07/2011, 8:32 AM

## 2011-09-10 ENCOUNTER — Inpatient Hospital Stay (HOSPITAL_COMMUNITY): Admission: RE | Admit: 2011-09-10 | Payer: PRIVATE HEALTH INSURANCE | Source: Ambulatory Visit | Admitting: *Deleted

## 2011-09-10 ENCOUNTER — Telehealth (HOSPITAL_COMMUNITY): Payer: Self-pay

## 2011-09-10 ENCOUNTER — Encounter: Payer: Self-pay | Admitting: Vascular Surgery

## 2011-09-11 ENCOUNTER — Telehealth (HOSPITAL_COMMUNITY): Payer: Self-pay | Admitting: *Deleted

## 2011-09-11 ENCOUNTER — Ambulatory Visit (HOSPITAL_COMMUNITY)
Admission: RE | Admit: 2011-09-11 | Discharge: 2011-09-11 | Disposition: A | Discharge status: Home / Self Care | Payer: PRIVATE HEALTH INSURANCE | Source: Ambulatory Visit | Attending: Internal Medicine | Admitting: Internal Medicine

## 2011-09-11 ENCOUNTER — Other Ambulatory Visit (INDEPENDENT_AMBULATORY_CARE_PROVIDER_SITE_OTHER): Payer: PRIVATE HEALTH INSURANCE | Admitting: *Deleted

## 2011-09-11 ENCOUNTER — Encounter: Payer: Self-pay | Admitting: Vascular Surgery

## 2011-09-11 ENCOUNTER — Encounter (HOSPITAL_BASED_OUTPATIENT_CLINIC_OR_DEPARTMENT_OTHER): Payer: PRIVATE HEALTH INSURANCE

## 2011-09-11 ENCOUNTER — Ambulatory Visit (INDEPENDENT_AMBULATORY_CARE_PROVIDER_SITE_OTHER): Payer: PRIVATE HEALTH INSURANCE | Admitting: *Deleted

## 2011-09-11 ENCOUNTER — Other Ambulatory Visit (HOSPITAL_COMMUNITY): Payer: Self-pay | Admitting: Oncology

## 2011-09-11 ENCOUNTER — Ambulatory Visit (INDEPENDENT_AMBULATORY_CARE_PROVIDER_SITE_OTHER): Payer: PRIVATE HEALTH INSURANCE | Admitting: Vascular Surgery

## 2011-09-11 VITALS — BP 136/84 | HR 69 | Resp 20 | Ht 72.0 in | Wt 270.0 lb

## 2011-09-11 DIAGNOSIS — I82409 Acute embolism and thrombosis of unspecified deep veins of unspecified lower extremity: Secondary | ICD-10-CM

## 2011-09-11 DIAGNOSIS — I871 Compression of vein: Secondary | ICD-10-CM | POA: Insufficient documentation

## 2011-09-11 DIAGNOSIS — L97909 Non-pressure chronic ulcer of unspecified part of unspecified lower leg with unspecified severity: Secondary | ICD-10-CM

## 2011-09-11 DIAGNOSIS — R609 Edema, unspecified: Secondary | ICD-10-CM

## 2011-09-11 DIAGNOSIS — R0989 Other specified symptoms and signs involving the circulatory and respiratory systems: Secondary | ICD-10-CM

## 2011-09-11 LAB — PROTIME-INR
INR: 2.65 — ABNORMAL HIGH (ref 0.00–1.49)
Prothrombin Time: 28.7 seconds — ABNORMAL HIGH (ref 11.6–15.2)

## 2011-09-11 NOTE — Telephone Encounter (Signed)
Did make contact with patient and told him the correct appt date/time to return for inr which is Oct 26th and to continue same coumadin dose. Patient verbalized understanding of instructions.

## 2011-09-11 NOTE — Progress Notes (Signed)
[facility] [place]  Established Central Venous Interruption  History of Present Illness  BEXLEY MCLESTER is a 50 y.o. male who presents with chief complaint: continued B leg swelling.  The patient's symptoms have been variable.  His swelling has been intermittent in severity and  has developed a venous ulcer since last I saw him.  He is on Coumadin 15 mg mixed with 10 mg every other day.  He denies any pulmonary sx.  Past Medical History, Past Surgical History, Social History, Family History, Medications, Allergies, and Review of Systems are unchanged from previous evaluation on 06/12/11.  Physical Examination  Filed Vitals:   09/11/11 1442  BP: 136/84  Pulse: 69  Resp: 20  Height: 6' (1.829 m)  Weight: 270 lb (122.471 kg)    General: A&O x 3, WDWN  Pulmonary: Sym exp, good air movt, CTAB, no rales, rhonchi, & wheezing  Cardiac: RRR, Nl S1, S2, no Murmurs, rubs or gallops  Vascular: Vessel Right Left  Radial Palpable Palpable  Brachial Palpable Palpable  Carotid Palpable, without bruit Palpable, without bruit  Aorta Non-palpable N/A  Femoral Palpable Palpable  Popliteal Non-palpable Non-palpable  PT Non-Palpable Non-Palpable  DP Non-Palpable Non-Palpable   Musculoskeletal: M/S 5/5 throughout , Extremities without ischemic changes  except  LLE which cannot be evaluated with the coban dressing around his left leg  Neurologic: Pain and light touch intact in extremities , Motor exam as listed above  Non-Invasive Vascular Imaging BLE Venous Duplex (Date: 09-11-11)  RLE: thrombus in CFV to pop, PTV not visualized, R GSV thrombosed  LLE: thrombus in CFV to pop, PTV not visualized, R GSV open  Medical Decision Making  DONDRELL LOUDERMILK is a 50 y.o. 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  Unfortunately, in this patient's only management options include lifelong anti-coagulation and  compression stockings.  Surgical reconstruction of the IVC is not routinely completed  He has wound care already established at an outside facility As his thrombus in his femoropopliteal system is likely chronic, I doubt it is amendable to percutaneous thrombolysis With poor central outflow due to IVC atresia or agenesis, I don't think the surgical morbidity of venous thrombectomy is merited as without central outflow, these veins are doomed to rethrombose anyway Unfortunately with his uninsured status, I am not certain if I can get him evaluated at a tertiary center.  I will have to investigate further if [facility] would see this patient. Thank you for allowing [place] to participate in this patient's care.  Leonides Sake, MD [facility] [place] Office: [phone] Pager: [phone]

## 2011-09-11 NOTE — Progress Notes (Signed)
Physical Therapy Treatment Patient Details  Name: Matthew Wise MRN: [id number] Date of Birth: 12-Aug-1961  Today's Date: 09/11/2011 Time: 1025-1056 Time Calculation (min): 31 min Charges: Deb <20 cm Visit#: 6  of 8   Re-eval: 09/18/11    Subjective: Symptoms/Limitations Symptoms: Pt reports that he has quite a bit of pain today.  He reports that he continues to wrap his leg at home and leaves it off when he comes into therapy.   Precautions/Restrictions     Mobility (including Balance)       Exercise/Treatments Comes in without LE compression.  Drainage amount unknown. Wounds cleansed with carraklenz and debrided with forceps. All dry skin removed from wounds with 100% granulation beneath. Wounds dressed with hydrogel infused 2X2's, kling and coban applied from base of metatarsals to patellar tendon to promote circulation/decrease swelling.    Physical Therapy Assessment and Plan PT Assessment and Plan Clinical Impression Statement: Opened wounds continue to decrease in size.  Continues to have increased swelling which is likely contributing to open sores.  PT Plan: Continue with woundcare and compression therapy    Goals    Problem List Patient Active Problem List  Diagnoses  . Post-phlebitic syndrome  . IVC obstruction  . DVT (deep venous thrombosis)  . H/O PE (pulmonary embolism)  . Congenital inferior vena cava interruption  . Leg wound, left    PT - End of Session Activity Tolerance: Patient tolerated treatment well  Kallin Henk 09/11/2011, 10:58 AM

## 2011-09-11 NOTE — Telephone Encounter (Signed)
According to [facility] appt list, patient has a lot of appts today so I could not reach patient by cell phone. I had to leave a message on his cell phone instructing him to continue his current dose of coumadin and to return to [place] in 1 week next Friday @ 12 for pt/inr. I asked him to contact [facility] and ask for Fannie Knee or Rolly Salter in regards to this message.

## 2011-09-11 NOTE — Progress Notes (Signed)
Labs drawn today for pt.  Patient on 12.5mg and 15mg alternating.  Call patient at [phone] 

## 2011-09-14 ENCOUNTER — Telehealth (HOSPITAL_COMMUNITY): Payer: Self-pay | Admitting: *Deleted

## 2011-09-14 ENCOUNTER — Inpatient Hospital Stay (HOSPITAL_COMMUNITY): Admission: RE | Admit: 2011-09-14 | Payer: PRIVATE HEALTH INSURANCE | Source: Ambulatory Visit | Admitting: *Deleted

## 2011-09-15 LAB — GC/CHLAMYDIA PROBE AMP, GENITAL: GC Probe Amp, Genital: POSITIVE — AB

## 2011-09-17 ENCOUNTER — Ambulatory Visit (HOSPITAL_COMMUNITY)
Admission: RE | Admit: 2011-09-17 | Discharge: 2011-09-17 | Disposition: A | Discharge status: Home / Self Care | Payer: PRIVATE HEALTH INSURANCE | Source: Ambulatory Visit | Attending: Internal Medicine | Admitting: Internal Medicine

## 2011-09-17 NOTE — Patient Instructions (Signed)
Instructed to put LE up on a wall 5 minutes 3 times a day.

## 2011-09-17 NOTE — Progress Notes (Signed)
Physical Therapy Treatment Patient Details  Name: KAYDENCE BABA MRN: [id number] Date of Birth: [birth year]  Today's Date: 09/17/2011 Time: 1191-4782 Time Calculation (min): 22 min Visit#: 7  of 8   Re-eval: 09/18/11  charge: deb< 20  Subjective: Symptoms/Limitations Symptoms: Pt states tht he is doing alright still having pain.  Legs are still significantly swollen.  Urged patient to put legs up on wall for 5 minutes three times a day to promote venous circulation Pain Assessment Currently in Pain?: Yes Pain Score:   3 Pain Location: Leg Pain Orientation: Left      Exercise/Treatments Wound care with minimal debridement.  Pt educated in proper wrapping of ACE wrap. Dressed with Carraklenze, Kerlix and Ace wrap. Physical Therapy Assessment and Plan PT Assessment and Plan Clinical Impression Statement: two small wounds one located medial-posteriorly the second medical distally both with scant drainage measuring approximately .5  cm diameter.  Anticiapate ready for discharge after next weeks visit. Clinical Impairments Affecting Rehab Potential: vericose veins PT Frequency: Min 1X/week PT Plan: D/C next visit unless wounds have increased in size.    Goals  healed wounds  Problem List Patient Active Problem List  Diagnoses  . Post-phlebitic syndrome  . IVC obstruction  . DVT (deep venous thrombosis)  . H/O PE (pulmonary embolism)  . Congenital inferior vena cava interruption  . Leg wound, left  . IVC (inferior vena cava obstruction)    PT - End of Session Activity Tolerance: Patient tolerated treatment well General Behavior During Session: [facility] for tasks performed Cognition: [facility] for tasks performed  Amanda Steuart,CINDY 09/17/2011, 9:18 AM

## 2011-09-18 NOTE — Procedures (Unsigned)
DUPLEX DEEP VENOUS EXAM - LOWER EXTREMITY  INDICATION:  Followup lower extremity DVT.  HISTORY:  Edema:  Yes. Trauma/Surgery:  No. Pain:  No. PE:  No. Previous DVT:  Yes. Anticoagulants:  Currently taking Coumadin. Other:  Known IVC occlusion.  DUPLEX EXAM:               CFV   SFV   PopV  PTV       GSV               R  L  R  L  R  L  R    L    R  L Thrombosis    +  +  +  +  +  +  N/V  N/V  +  o Spontaneous   o  o  o  o  o  o  o    o    o  + Phasic        o  o  o  o  o  o            o  + Augmentation  o  o  o  o  o  o            o  + Compressible  o  o  o  o  o  o            o  + Competent     o  o  o  o  o  o            o  +  Legend:  + - yes  o - no  p - partial  D - decreased  IMPRESSION: 1. Extensive right and left lower extremity deep venous thrombosis,     extending from the common femoral vein through the popliteal vein. 2. Calf veins were unable to be visualized due to excessive edema.   _____________________________ Fransisco Hertz, MD  EM/MEDQ  D:  09/15/2011  T:  09/15/2011  Job:  [id number]

## 2011-09-21 ENCOUNTER — Ambulatory Visit (HOSPITAL_COMMUNITY)
Admission: RE | Admit: 2011-09-21 | Discharge: 2011-09-21 | Disposition: A | Discharge status: Home / Self Care | Payer: PRIVATE HEALTH INSURANCE | Source: Ambulatory Visit | Attending: Internal Medicine | Admitting: Internal Medicine

## 2011-09-21 NOTE — Progress Notes (Signed)
Physical Therapy Treatment Patient Details  Name: Matthew Wise MRN: [id number] Date of Birth: May 01, 1961  Today's Date: 09/21/2011 Time: 4098-1191 Time Calculation (min): 23 min Visit#: 8  of 8   Re-eval: 09/21/11 Charges:  Debridement < 20cm    Subjective: Symptoms/Limitations Symptoms: Pt. reports his legs are doing better.  Less swelling and pain. Pain Assessment Currently in Pain?: No/denies   Exercise/Treatments Woundcare:  Pt. Received irrigation/debridement to L LE medial wound. Overall improved with one small wound remaining, approx. 0.25cm2 in size.  No other erruptions or irritations perimeter of L LE.  Wound cleansed with carraklenz and placed hydrogel infused 2X2 on wound.  Covered with kling and coban.  Pt. To continue care independently.  See assessment below.    Physical Therapy Assessment and Plan PT Assessment and Plan Clinical Impression Statement: Only one small wound remains, .25cm2 with no drainage, 100%granulation.  Pt. verbalizes ability to continue care independently.  Pt to begin wearing his compression stockings. PT Plan: Discharge per PT plan and no longer in need of skilled woundcare.    Goals PT Short Term Goals Time to Complete Short Term Goals: 2 weeks PT Short Term Goal 1: Pt wound to have scant drainage; 75% epithelization PT Short Term Goal 1 - Progress: Met PT Short Term Goal 2: Pt to verbalize the importance of compression stockings. PT Short Term Goal 2 - Progress: Met PT Long Term Goals PT Long Term Goal 1: 4 wk- wounds to be healed PT Long Term Goal 1 - Progress: Partly met PT Long Term Goal 2: Pt to state that he can by wb for 30 minutes without any increased pain.-4 wk PT Long Term Goal 2 - Progress: Met  Problem List Patient Active Problem List  Diagnoses  . Post-phlebitic syndrome  . IVC obstruction  . DVT (deep venous thrombosis)  . H/O PE (pulmonary embolism)  . Congenital inferior vena cava interruption  . Leg wound,  left  . IVC (inferior vena cava obstruction)    PT - End of Session Activity Tolerance: Patient tolerated treatment well General Behavior During Session: [facility] for tasks performed Cognition: [facility] for tasks performed  Emeline Gins B 09/21/2011, 9:18 AM

## 2011-09-24 ENCOUNTER — Inpatient Hospital Stay (HOSPITAL_COMMUNITY): Admission: RE | Admit: 2011-09-24 | Payer: PRIVATE HEALTH INSURANCE | Source: Ambulatory Visit | Admitting: *Deleted

## 2011-09-25 ENCOUNTER — Other Ambulatory Visit (HOSPITAL_COMMUNITY): Payer: Self-pay | Admitting: Oncology

## 2011-09-25 ENCOUNTER — Encounter (HOSPITAL_COMMUNITY): Payer: PRIVATE HEALTH INSURANCE

## 2011-09-25 ENCOUNTER — Telehealth (HOSPITAL_COMMUNITY): Payer: Self-pay | Admitting: *Deleted

## 2011-09-25 DIAGNOSIS — I82409 Acute embolism and thrombosis of unspecified deep veins of unspecified lower extremity: Secondary | ICD-10-CM

## 2011-09-25 LAB — PROTIME-INR: INR: 1.48 (ref 0.00–1.49)

## 2011-09-25 NOTE — Progress Notes (Signed)
Patient had missed 1 or 2 days of coud.

## 2011-09-25 NOTE — Telephone Encounter (Signed)
Message copied by Dennie Maizes on Fri Sep 25, 2011  1:04 PM ------      Message from: Ellouise Newer III      Created: Fri Sep 25, 2011 12:33 PM       Any missed dose?            Same Dose            PT/INR in 1 week

## 2011-09-25 NOTE — Progress Notes (Signed)
Labs drawn today for pt.  Patient on 12.5mg  and 15 alternating.  Call patient at [phone]

## 2011-09-25 NOTE — Telephone Encounter (Signed)
Pt reports he missed taking Coumadin 2-3 days last weekend. Instruct to continue same dose Coumadin and return to clinic next Friday for lab work. Verbalized understanding.

## 2011-09-28 ENCOUNTER — Ambulatory Visit (HOSPITAL_COMMUNITY): Payer: PRIVATE HEALTH INSURANCE | Admitting: Physical Therapy

## 2011-10-01 ENCOUNTER — Ambulatory Visit (HOSPITAL_COMMUNITY): Payer: PRIVATE HEALTH INSURANCE | Admitting: Physical Therapy

## 2011-10-02 ENCOUNTER — Encounter (HOSPITAL_COMMUNITY): Payer: PRIVATE HEALTH INSURANCE | Attending: Oncology

## 2011-10-02 ENCOUNTER — Other Ambulatory Visit (HOSPITAL_COMMUNITY): Payer: Self-pay | Admitting: Oncology

## 2011-10-02 ENCOUNTER — Telehealth (HOSPITAL_COMMUNITY): Payer: Self-pay | Admitting: *Deleted

## 2011-10-02 DIAGNOSIS — I82409 Acute embolism and thrombosis of unspecified deep veins of unspecified lower extremity: Secondary | ICD-10-CM | POA: Insufficient documentation

## 2011-10-02 LAB — PROTIME-INR: Prothrombin Time: 37.6 seconds — ABNORMAL HIGH (ref 11.6–15.2)

## 2011-10-02 NOTE — Progress Notes (Signed)
Labs drawn today for pt.  Patient on 12.5mg and 15mg alternating.  Call patient at [phone] 

## 2011-10-02 NOTE — Telephone Encounter (Signed)
No coumadin Friday or Saturday Resume same dose Sunday PT INR on Monday am.

## 2011-10-05 ENCOUNTER — Telehealth (HOSPITAL_COMMUNITY): Payer: Self-pay

## 2011-10-05 ENCOUNTER — Encounter (HOSPITAL_COMMUNITY): Payer: PRIVATE HEALTH INSURANCE

## 2011-10-05 ENCOUNTER — Other Ambulatory Visit (HOSPITAL_COMMUNITY): Payer: Self-pay | Admitting: Oncology

## 2011-10-05 DIAGNOSIS — I82409 Acute embolism and thrombosis of unspecified deep veins of unspecified lower extremity: Secondary | ICD-10-CM

## 2011-10-05 LAB — PROTIME-INR
INR: 2.21 — ABNORMAL HIGH (ref 0.00–1.49)
Prothrombin Time: 24.9 seconds — ABNORMAL HIGH (ref 11.6–15.2)

## 2011-10-05 MED ORDER — OXYCODONE HCL 5 MG PO CAPS: 10.0000 mg | ORAL_CAPSULE | ORAL | Status: DC | PRN

## 2011-10-05 NOTE — Progress Notes (Signed)
Labs drawn today for pt.  Patient on 12.5mg and 15mg alternating.  Call patient at [phone] 

## 2011-10-05 NOTE — Telephone Encounter (Signed)
Notes Recorded by Dellis Anes, PA on 10/05/2011 at 10:17 AM Same dose  PT/INR in one week  1806 patient notified.  To RTC 11/12 @ 9:40 for next PT/INR.

## 2011-10-12 ENCOUNTER — Encounter (HOSPITAL_COMMUNITY): Payer: PRIVATE HEALTH INSURANCE

## 2011-10-12 ENCOUNTER — Other Ambulatory Visit (HOSPITAL_COMMUNITY): Payer: Self-pay | Admitting: Oncology

## 2011-10-12 DIAGNOSIS — I82409 Acute embolism and thrombosis of unspecified deep veins of unspecified lower extremity: Secondary | ICD-10-CM

## 2011-10-12 LAB — PROTIME-INR: Prothrombin Time: 21.9 seconds — ABNORMAL HIGH (ref 11.6–15.2)

## 2011-10-12 NOTE — Progress Notes (Signed)
Labs drawn today for pt.  Patient on 12.5 and 15 mg alternating. Call patient at [phone]

## 2011-10-15 ENCOUNTER — Ambulatory Visit (HOSPITAL_COMMUNITY): Payer: PRIVATE HEALTH INSURANCE | Admitting: Oncology

## 2011-10-15 ENCOUNTER — Encounter (HOSPITAL_BASED_OUTPATIENT_CLINIC_OR_DEPARTMENT_OTHER): Payer: PRIVATE HEALTH INSURANCE | Admitting: Oncology

## 2011-10-15 DIAGNOSIS — Q268 Other congenital malformations of great veins: Secondary | ICD-10-CM

## 2011-10-15 DIAGNOSIS — I82409 Acute embolism and thrombosis of unspecified deep veins of unspecified lower extremity: Secondary | ICD-10-CM

## 2011-10-15 DIAGNOSIS — I871 Compression of vein: Secondary | ICD-10-CM

## 2011-10-15 DIAGNOSIS — S81802A Unspecified open wound, left lower leg, initial encounter: Secondary | ICD-10-CM

## 2011-10-15 DIAGNOSIS — Q269 Congenital malformation of great vein, unspecified: Secondary | ICD-10-CM

## 2011-10-15 DIAGNOSIS — I2699 Other pulmonary embolism without acute cor pulmonale: Secondary | ICD-10-CM

## 2011-10-15 NOTE — Patient Instructions (Signed)
[facility]  Discharge Instructions  RECOMMENDATIONS MADE BY THE CONSULTANT AND ANY TEST RESULTS WILL BE SENT TO YOUR REFERRING DOCTOR.   EXAM FINDINGS BY MD TODAY AND SIGNS AND SYMPTOMS TO REPORT TO CLINIC OR PRIMARY MD:   Exam good. Return in 2 months.  I acknowledge that I have been informed and understand all the instructions given to me and received a copy. I do not have any more questions at this time, but understand that I may call the Specialty Clinic at [facility] at [phone] during business hours should I have any further questions or need assistance in obtaining follow-up care.    __________________________________________  _____________  __________ Signature of Patient or Authorized Representative            Date                   Time    __________________________________________ Nurse's Signature

## 2011-10-15 NOTE — Progress Notes (Signed)
Matthew Wise., MD [address] [place] [address]  1. H/O PE (pulmonary embolism)   2. DVT (deep venous thrombosis)   3. Congenital inferior vena cava interruption   4. IVC (inferior vena cava obstruction)   5. IVC obstruction   6. Leg wound, left     CURRENT THERAPY: Coumadin anticoagulation  INTERVAL HISTORY: Matthew Wise 50 y.o. male returns for  regular  visit for followup of  DVT.  The patient reports that he recently underwent a venous duplex by Dr. Imogene Wise which continues to reveal a deep vein thrombosis.  The exam reveals  extensive right and left lower extremity deep vein thrombosis, extending from the common from femoral vein through the popliteal vein.  As result he continues to have lower extremity edema right greater than left. This is much improved since diagnosis.  Otherwise the patient denies any complaints. I spent some time with, the patient going over patient education. The patient will definitely require at least one full year of anticoagulation. However, this deep vein thrombosis has not shown improvement and I therefore warned the patient that he may require longer therapy for this.  Patient does report continuation of lower extremity pain with exertion and ambulation. This has improved since diagnosis but remains. He is on pain medication for this symptom.  Past Medical History  Diagnosis Date  . DVT of leg (deep venous thrombosis)     rt. leg  . Inferior vena caval thrombosis     Chronic  . Hypothyroidism   . DVT (deep venous thrombosis) 06/15/2011  . H/O PE (pulmonary embolism) 06/15/2011  . 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      has no known allergies.  Matthew Wise does not  currently have medications on file.  No past surgical history on file.  Denies any headaches, dizziness, double vision, fevers, chills, night sweats, nausea, vomiting, diarrhea, constipation, chest pain, heart palpitations, shortness of breath, blood in stool, black tarry stool, urinary pain, urinary burning, urinary frequency, hematuria.   PHYSICAL EXAMINATION  ECOG PERFORMANCE STATUS: 1 - Symptomatic but completely ambulatory  Filed Vitals:   10/15/11 1100  BP: 150/90  Pulse: 63  Temp: 97.3 F (36.3 C)    GENERAL:alert, no distress, well nourished, well developed, comfortable, cooperative, obese and smiling SKIN: skin color, texture, turgor are normal HEAD: Normocephalic EYES: normal EARS: External ears normal OROPHARYNX:lips, buccal mucosa, and tongue normal and mucous membranes are moist  NECK: supple, no adenopathy, no bruits, thyroid normal size, non-tender, without nodularity, no stridor, non-tender, trachea midline LYMPH:  no palpable lymphadenopathy, no hepatosplenomegaly BREAST:not examined LUNGS: clear to auscultation and percussion HEART: regular rate & rhythm, no murmurs, no gallops, S1 normal and S2 normal ABDOMEN:abdomen soft, non-tender, obese, normal bowel sounds and no hepatosplenomegaly BACK: Back symmetric, no curvature., No CVA tenderness EXTREMITIES:less then 2 second capillary refill, no joint deformities, effusion, or inflammation, no clubbing, no cyanosis, positive findings:  edema B/L R>L 1+ pitting edema and Left LE ulcer, healed  NEURO: alert & oriented x 3 with fluent speech, no focal motor/sensory deficits, gait normal   LABORATORY DATA: Lab Results  Component Value Date   INR 1.88* 10/12/2011   INR 2.21* 10/05/2011   INR 3.75* 10/02/2011     RADIOGRAPHIC STUDIES:  09/11/11  Duplex deep vein exam- extensive right and left lower extremity deep vein thrombosis, extending from the common from femoral vein through the popliteal vein.       ASSESSMENT:  1. DVT, on coumadin anticoagulation   PLAN:  1. PT/INR as scheduled 2. I personally reviewed and went over laboratory results with the patient. 3. I personally reviewed and went over radiographic studies with the patient. 4. Return in 2 months for follow-up   All questions were answered. The patient knows to call the clinic with any problems, questions or concerns. We can certainly see the patient much sooner if necessary.  The patient and plan discussed with Matthew Crane, MD, [facility] and he is in agreement with the aforementioned.   Matthew Wise

## 2011-10-19 ENCOUNTER — Other Ambulatory Visit (HOSPITAL_COMMUNITY): Payer: PRIVATE HEALTH INSURANCE

## 2011-11-04 ENCOUNTER — Encounter (HOSPITAL_COMMUNITY): Payer: PRIVATE HEALTH INSURANCE | Attending: Oncology

## 2011-11-04 ENCOUNTER — Other Ambulatory Visit (HOSPITAL_COMMUNITY): Payer: Self-pay | Admitting: Oncology

## 2011-11-04 DIAGNOSIS — I2699 Other pulmonary embolism without acute cor pulmonale: Secondary | ICD-10-CM

## 2011-11-04 DIAGNOSIS — I82409 Acute embolism and thrombosis of unspecified deep veins of unspecified lower extremity: Secondary | ICD-10-CM | POA: Insufficient documentation

## 2011-11-04 MED ORDER — OXYCODONE HCL 5 MG PO CAPS: 10.0000 mg | ORAL_CAPSULE | ORAL | Status: DC | PRN

## 2011-11-04 NOTE — Progress Notes (Signed)
Labs drawn today for pt.  Patient on 12.5mg and 15mg alternating.  Call patient at [phone] 

## 2011-11-18 ENCOUNTER — Other Ambulatory Visit (HOSPITAL_COMMUNITY): Payer: Self-pay | Admitting: Oncology

## 2011-11-18 ENCOUNTER — Encounter (HOSPITAL_BASED_OUTPATIENT_CLINIC_OR_DEPARTMENT_OTHER): Payer: PRIVATE HEALTH INSURANCE

## 2011-11-18 DIAGNOSIS — I82409 Acute embolism and thrombosis of unspecified deep veins of unspecified lower extremity: Secondary | ICD-10-CM

## 2011-11-18 NOTE — Progress Notes (Signed)
Labs drawn today for pt.  Patient on 12.5mg  and 15mg  coud.  Call patient at [phone]

## 2011-11-25 ENCOUNTER — Encounter (HOSPITAL_BASED_OUTPATIENT_CLINIC_OR_DEPARTMENT_OTHER): Payer: PRIVATE HEALTH INSURANCE

## 2011-11-25 DIAGNOSIS — I82409 Acute embolism and thrombosis of unspecified deep veins of unspecified lower extremity: Secondary | ICD-10-CM

## 2011-11-25 LAB — PROTIME-INR: INR: 2.75 — ABNORMAL HIGH (ref 0.00–1.49)

## 2011-11-25 NOTE — Progress Notes (Signed)
Matthew Wise presented for labwork. Labs per MD order drawn via Peripheral Line 24 gauge needle inserted in  Rt ac Good blood return present. Procedure without incident.  Needle removed intact. Patient tolerated procedure well.  Coumadin 12.5/15

## 2011-11-25 NOTE — Progress Notes (Signed)
Pt notified to continue same dose of coumadin. Return in 1 week jan 2 @ 1010 for inr.

## 2011-12-02 ENCOUNTER — Telehealth (HOSPITAL_COMMUNITY): Payer: Self-pay | Admitting: *Deleted

## 2011-12-02 ENCOUNTER — Encounter (HOSPITAL_COMMUNITY): Payer: PRIVATE HEALTH INSURANCE | Attending: Oncology

## 2011-12-02 DIAGNOSIS — I82409 Acute embolism and thrombosis of unspecified deep veins of unspecified lower extremity: Secondary | ICD-10-CM | POA: Insufficient documentation

## 2011-12-02 LAB — PROTIME-INR
INR: 3.46 — ABNORMAL HIGH (ref 0.00–1.49)
Prothrombin Time: 35.3 seconds — ABNORMAL HIGH (ref 11.6–15.2)

## 2011-12-02 MED ORDER — OXYCODONE HCL 5 MG PO CAPS: 10.0000 mg | ORAL_CAPSULE | ORAL | Status: DC | PRN

## 2011-12-02 NOTE — Telephone Encounter (Signed)
Current dose coumadin 12.5mg  daily. Left message on answering machine that pt is not to take any more Coumadin until we call him back with further instructions.

## 2011-12-02 NOTE — Telephone Encounter (Signed)
Message copied by Dennie Maizes on Wed Dec 02, 2011  4:44 PM ------      Message from: Mariel Sleet, ERIC S      Created: Wed Dec 02, 2011  2:32 PM       Hold coumadin tonight-what is present dose???      Let me know.

## 2011-12-02 NOTE — Progress Notes (Signed)
Labs drawn today for pt.  Patient on 12.5mg  of coud.  Call patient at [phone]

## 2011-12-04 ENCOUNTER — Other Ambulatory Visit (HOSPITAL_COMMUNITY): Payer: Self-pay | Admitting: Oncology

## 2011-12-04 DIAGNOSIS — I82409 Acute embolism and thrombosis of unspecified deep veins of unspecified lower extremity: Secondary | ICD-10-CM

## 2011-12-04 DIAGNOSIS — I2699 Other pulmonary embolism without acute cor pulmonale: Secondary | ICD-10-CM

## 2011-12-09 ENCOUNTER — Other Ambulatory Visit (HOSPITAL_COMMUNITY): Payer: Self-pay

## 2011-12-09 ENCOUNTER — Encounter (HOSPITAL_COMMUNITY): Payer: PRIVATE HEALTH INSURANCE

## 2011-12-09 DIAGNOSIS — I82409 Acute embolism and thrombosis of unspecified deep veins of unspecified lower extremity: Secondary | ICD-10-CM

## 2011-12-09 DIAGNOSIS — I871 Compression of vein: Secondary | ICD-10-CM

## 2011-12-09 LAB — PROTIME-INR
INR: 2.69 — ABNORMAL HIGH (ref 0.00–1.49)
Prothrombin Time: 29 seconds — ABNORMAL HIGH (ref 11.6–15.2)

## 2011-12-09 NOTE — Progress Notes (Signed)
Labs drawn today for pt.  Patient on 12.5mg of coud.  Call patient at [phone] 

## 2011-12-17 ENCOUNTER — Encounter (HOSPITAL_BASED_OUTPATIENT_CLINIC_OR_DEPARTMENT_OTHER): Payer: PRIVATE HEALTH INSURANCE | Admitting: Oncology

## 2011-12-17 ENCOUNTER — Other Ambulatory Visit (HOSPITAL_COMMUNITY): Payer: Self-pay | Admitting: Oncology

## 2011-12-17 VITALS — BP 130/81 | HR 71 | Temp 97.7°F | Wt 285.0 lb

## 2011-12-17 DIAGNOSIS — I82409 Acute embolism and thrombosis of unspecified deep veins of unspecified lower extremity: Secondary | ICD-10-CM

## 2011-12-17 DIAGNOSIS — Z86711 Personal history of pulmonary embolism: Secondary | ICD-10-CM

## 2011-12-17 DIAGNOSIS — L97309 Non-pressure chronic ulcer of unspecified ankle with unspecified severity: Secondary | ICD-10-CM

## 2011-12-17 LAB — COMPREHENSIVE METABOLIC PANEL
Alkaline Phosphatase: 81 U/L (ref 39–117)
BUN: 10 mg/dL (ref 6–23)
GFR calc Af Amer: >90 mL/min (ref >90)
Glucose, Bld: 101 mg/dL — ABNORMAL HIGH (ref 70–99)
Potassium: 4.1 mEq/L (ref 3.5–5.1)
Total Protein: 7.7 g/dL (ref 6.0–8.3)

## 2011-12-17 LAB — CBC
HCT: 48 % (ref 39.0–52.0)
Hemoglobin: 17.3 g/dL — ABNORMAL HIGH (ref 13.0–17.0)
MCH: 31.3 pg (ref 26.0–34.0)
MCHC: 36 g/dL (ref 30.0–36.0)

## 2011-12-17 LAB — PROTIME-INR
INR: 1.89 — ABNORMAL HIGH (ref 0.00–1.49)
Prothrombin Time: 22 seconds — ABNORMAL HIGH (ref 11.6–15.2)

## 2011-12-17 LAB — DIFFERENTIAL
Basophils Absolute: 0 10*3/uL (ref 0.0–0.1)
Basophils Relative: 0 % (ref 0–1)
Eosinophils Absolute: 0.1 10*3/uL (ref 0.0–0.7)
Lymphs Abs: 1.8 10*3/uL (ref 0.7–4.0)
Neutrophils Relative %: 46 % (ref 43–77)

## 2011-12-17 MED ORDER — OXYCODONE HCL 5 MG PO CAPS: 10.0000 mg | ORAL_CAPSULE | ORAL | Status: DC | PRN

## 2011-12-17 NOTE — Progress Notes (Signed)
Patient to continue 12.5mg /15mg  of coumadin as he was previously taking. Pt to return on 1/28 @ 9:10 for pt/inr. Pt verbalized understanding of instructions.

## 2011-12-17 NOTE — Progress Notes (Signed)
PT/INR in 10 days. CBC with next lab draw. Continue same dose of Coumadin.

## 2011-12-17 NOTE — Patient Instructions (Signed)
[facility]  Discharge Instructions  RECOMMENDATIONS MADE BY THE CONSULTANT AND ANY TEST RESULTS WILL BE SENT TO YOUR REFERRING DOCTOR.   EXAM FINDINGS BY MD TODAY AND SIGNS AND SYMPTOMS TO REPORT TO CLINIC OR PRIMARY MD: Lab work today. Please consider having a CT scan done due to your history of smoking. We will call you with your PT level and instructions for your Coumadin. Return to clinic to see MD in 3 months.    I acknowledge that I have been informed and understand all the instructions given to me and received a copy. I do not have any more questions at this time, but understand that I may call the Specialty Clinic at [facility] at [phone] during business hours should I have any further questions or need assistance in obtaining follow-up care.    __________________________________________  _____________  __________ Signature of Patient or Authorized Representative            Date                   Time    __________________________________________ Nurse's Signature

## 2011-12-17 NOTE — Progress Notes (Signed)
[name]., MD, MD [address] [place] [address]  1. DVT (deep venous thrombosis)  oxycodone (OXY-IR) 5 MG capsule, CBC, Differential, Comprehensive metabolic panel, Protime-INR    CURRENT THERAPY: On Coumadin 12.5 mg / 15 mg daily.  INTERVAL HISTORY: Matthew Wise 51 y.o. male returns for  regular  visit for followup of  DVT.  The patient is here for follow-up his his DVT.  The patient reports that he had on ulcer on his left LE superior to ankle on the medial side.  That ulcer has healed and now he has a small one superior to the large ulcer. He has it wrapped nicely and is clean.  He denies any fevers or chills.  He reports that it is healing and does not appear infected.  The patient denies having DM, but I wonder why he continues to have the ulcers.  He does have PAD and this likely has a strong contribution to this.  We will get a fasting glucose level today with his lab work.    The patient reports that he needs a refill of his Oxycodone.  He just had it filled on 12/02/11.  He went through 45 tablets in 15 days.  I gave him a refill, but I have asked him to try to make this last one month.  He has agreed.  The patient asked me to write a letter for him for his attorney for his disability claim.  I will discuss this with Dr. Mariel Sleet.  Hematologically, the patient denies any complaints.  ROS questioning is negative.    We spent some time discussing the fact that he may qualify for a low-dose spiral CT of chest to rule out occult malignancy due to his increased risk for bronchogenic carcinoma due to his smoking history.  The patient continues to smoke 1 ppd.   Past Medical History  Diagnosis Date  . DVT of leg (deep venous thrombosis)     rt. leg  . Inferior vena caval thrombosis     Chronic  . Hypothyroidism   . DVT (deep venous thrombosis) 06/15/2011  . H/O PE (pulmonary embolism) 06/15/2011  . 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      has no known allergies.  Matthew Wise had no medications administered during this visit.  No past surgical history on file.  Denies any headaches, dizziness, double vision, fevers, chills, night sweats, nausea, vomiting, diarrhea, constipation, chest pain, heart palpitations, shortness of breath, blood in stool, black tarry stool, urinary pain, urinary burning, urinary frequency, hematuria.   PHYSICAL EXAMINATION  ECOG PERFORMANCE STATUS: 2 - Symptomatic, <50% confined to bed  Filed Vitals:   12/17/11 0931  BP: 130/81  Pulse: 71  Temp: 97.7 F (36.5 C)    GENERAL:alert, no distress, well nourished, well developed, comfortable, cooperative, obese and smiling SKIN: skin color, texture, turgor are normal, no rashes or significant lesions HEAD: Normocephalic, No masses, lesions, tenderness or abnormalities EYES: normal EARS: External ears normal OROPHARYNX:mucous membranes are moist  NECK: supple, trachea midline LYMPH:  no palpable lymphadenopathy BREAST:not examined LUNGS: clear to auscultation and percussion HEART: regular rate & rhythm, no murmurs, no gallops, S1 normal and S2 normal ABDOMEN:abdomen soft, non-tender, obese and normal bowel sounds BACK: Back symmetric, no curvature., No  CVA tenderness EXTREMITIES:less then 2 second capillary refill, no skin discoloration, no cyanosis, positive findings:  edema 2+ pitting edema in LE and left LE ulcer with nice bandage in place.  Clubbing of fingernails.   NEURO: alert & oriented x 3 with fluent speech, no focal motor/sensory deficits, gait normal   PENDING LABS: CBC diff, CMET, PT/INR    ASSESSMENT:  1. DVT, on coumadin anticoagulation 12.5 mg / 15 mg alternating. 2. Tobacco  abuse, 1 ppd with a 30 pack year history.   PLAN:  1. Lab work today: CBC diff, CMET, PT/INR 2. Discussed the recommendation for a Low-dose spiral CT of chest to evaluate for bronchogenic carcinoma in light of his smoking history.  He will consider this. 3. Rx for Oxycodone 5 mg #45.   He will make this last one month.  4. Patient requested a letter for disability.  Will compose a letter with diagnosis and treatment and discuss this with Dr. Mariel Sleet.  5. I have asked the patient to participate in his care and asked him to get into a daily routine of taking his Coumadin at the same time every day.  There are instances when he missed doses. 6. Return in 3 months for follow-up. Will discuss the low-dose spiral CT of chest then, 7. Patient will likely require life-long anticoagulation therapy.   All questions were answered. The patient knows to call the clinic with any problems, questions or concerns. We can certainly see the patient much sooner if necessary.  I spent 20 minutes counseling the patient face to face. The total time spent in the appointment was 30 minutes. More than 50% of the time spent with the patient was utilized for counseling and coordination of care.   KEFALAS,THOMAS

## 2011-12-17 NOTE — Progress Notes (Signed)
To whom it may concern,  [name] is recently undergoing active treatment for a lower extremity deep vein thrombosis with anticoagulation.  The patient has a strong history of deep vein thromboses and pulmonary emboli.  As a result of this strong history, the patient will likely require lifelong anticoagulation with Coumadin.    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  If you may have any other questions, please do not hesitate to contact the [facility] at [phone].  Sincerely,    Maurine Minister. Kefalas III, PA-C

## 2011-12-17 NOTE — Progress Notes (Signed)
Labs drawn today for cbc/diff,pt,cmp. Patient on 12.5 of coud.  Call patient at [phone]

## 2011-12-23 ENCOUNTER — Other Ambulatory Visit (HOSPITAL_COMMUNITY): Payer: PRIVATE HEALTH INSURANCE

## 2011-12-28 ENCOUNTER — Other Ambulatory Visit (HOSPITAL_COMMUNITY): Payer: Self-pay

## 2011-12-30 ENCOUNTER — Other Ambulatory Visit (HOSPITAL_COMMUNITY): Payer: Self-pay | Admitting: Oncology

## 2011-12-30 ENCOUNTER — Encounter (HOSPITAL_COMMUNITY): Payer: PRIVATE HEALTH INSURANCE

## 2011-12-30 ENCOUNTER — Telehealth (HOSPITAL_COMMUNITY): Payer: Self-pay | Admitting: *Deleted

## 2011-12-30 DIAGNOSIS — I82409 Acute embolism and thrombosis of unspecified deep veins of unspecified lower extremity: Secondary | ICD-10-CM

## 2011-12-30 LAB — CBC
Hemoglobin: 16.7 g/dL (ref 13.0–17.0)
MCHC: 35.2 g/dL (ref 30.0–36.0)
Platelets: 180 10*3/uL (ref 150–400)
RBC: 5.44 MIL/uL (ref 4.22–5.81)

## 2011-12-30 LAB — PROTIME-INR
INR: 2.29 — ABNORMAL HIGH (ref 0.00–1.49)
Prothrombin Time: 25.6 seconds — ABNORMAL HIGH (ref 11.6–15.2)

## 2011-12-30 NOTE — Telephone Encounter (Signed)
Spoke with pt, instructions as below. Pt verbalized understanding.

## 2011-12-30 NOTE — Telephone Encounter (Signed)
Message copied by Dennie Maizes on Wed Dec 30, 2011 12:51 PM ------      Message from: Ellouise Newer III      Created: Wed Dec 30, 2011 10:42 AM       Same dose            PT/INR in one week

## 2011-12-30 NOTE — Progress Notes (Signed)
Labs drawn today for cbc,pt.  Patient on 12.5mg  of coud. Call patient at [phone]

## 2012-01-06 ENCOUNTER — Other Ambulatory Visit (HOSPITAL_COMMUNITY): Payer: PRIVATE HEALTH INSURANCE

## 2012-01-07 ENCOUNTER — Other Ambulatory Visit (HOSPITAL_COMMUNITY): Payer: Self-pay | Admitting: Oncology

## 2012-01-07 ENCOUNTER — Encounter (HOSPITAL_COMMUNITY): Payer: PRIVATE HEALTH INSURANCE | Attending: Oncology

## 2012-01-07 DIAGNOSIS — I82409 Acute embolism and thrombosis of unspecified deep veins of unspecified lower extremity: Secondary | ICD-10-CM

## 2012-01-07 NOTE — Progress Notes (Signed)
Matthew Wise presented for labwork. Labs per MD order drawn via Peripheral Line 23 gauge needle inserted in right AC.  Good blood return present. Procedure without incident.  Needle removed intact. Patient tolerated procedure well.  Pt reports taking Coumadin 12.5mg  alternating with 15mg .

## 2012-01-15 ENCOUNTER — Encounter (HOSPITAL_BASED_OUTPATIENT_CLINIC_OR_DEPARTMENT_OTHER): Payer: PRIVATE HEALTH INSURANCE

## 2012-01-15 ENCOUNTER — Other Ambulatory Visit (HOSPITAL_COMMUNITY): Payer: Self-pay | Admitting: Oncology

## 2012-01-15 DIAGNOSIS — I82409 Acute embolism and thrombosis of unspecified deep veins of unspecified lower extremity: Secondary | ICD-10-CM

## 2012-01-15 LAB — PROTIME-INR: Prothrombin Time: 20.6 seconds — ABNORMAL HIGH (ref 11.6–15.2)

## 2012-01-15 NOTE — Progress Notes (Signed)
Labs drawn today for 12.5 and 15 mg alternating.  Call patient at [phone]

## 2012-01-22 ENCOUNTER — Encounter (HOSPITAL_BASED_OUTPATIENT_CLINIC_OR_DEPARTMENT_OTHER): Payer: PRIVATE HEALTH INSURANCE

## 2012-01-22 DIAGNOSIS — I82409 Acute embolism and thrombosis of unspecified deep veins of unspecified lower extremity: Secondary | ICD-10-CM

## 2012-01-22 NOTE — Progress Notes (Signed)
Pt/inr drawn. Coumadin 12.5/15. Took 15mg  last pm. Call him on cell phone with results.

## 2012-01-25 ENCOUNTER — Telehealth (HOSPITAL_COMMUNITY): Payer: Self-pay | Admitting: *Deleted

## 2012-01-25 ENCOUNTER — Other Ambulatory Visit (HOSPITAL_COMMUNITY): Payer: Self-pay | Admitting: Oncology

## 2012-01-25 DIAGNOSIS — I82409 Acute embolism and thrombosis of unspecified deep veins of unspecified lower extremity: Secondary | ICD-10-CM

## 2012-01-25 NOTE — Telephone Encounter (Signed)
Message copied by Dennie Maizes on Mon Jan 25, 2012  2:46 PM ------      Message from: Ellouise Newer III      Created: Mon Jan 25, 2012  1:56 PM       Same dose            PT/INR in 1 week

## 2012-01-25 NOTE — Telephone Encounter (Signed)
Spoke with pt, instruct to continue same dose Coumadin 12.5 mg alternating with 15 mg every other day. RTC in 1 week for labs. Verbalized understanding.

## 2012-02-01 ENCOUNTER — Encounter (HOSPITAL_COMMUNITY): Payer: PRIVATE HEALTH INSURANCE | Attending: Oncology

## 2012-02-01 ENCOUNTER — Other Ambulatory Visit (HOSPITAL_COMMUNITY): Payer: Self-pay | Admitting: Oncology

## 2012-02-01 DIAGNOSIS — I82409 Acute embolism and thrombosis of unspecified deep veins of unspecified lower extremity: Secondary | ICD-10-CM | POA: Insufficient documentation

## 2012-02-03 NOTE — Progress Notes (Signed)
Lab draw

## 2012-02-11 ENCOUNTER — Encounter (HOSPITAL_COMMUNITY): Payer: PRIVATE HEALTH INSURANCE

## 2012-02-11 ENCOUNTER — Other Ambulatory Visit (HOSPITAL_COMMUNITY): Payer: Self-pay | Admitting: Oncology

## 2012-02-11 ENCOUNTER — Telehealth (HOSPITAL_COMMUNITY): Payer: Self-pay | Admitting: *Deleted

## 2012-02-11 DIAGNOSIS — I82409 Acute embolism and thrombosis of unspecified deep veins of unspecified lower extremity: Secondary | ICD-10-CM

## 2012-02-11 LAB — PROTIME-INR: INR: 3.11 — ABNORMAL HIGH (ref 0.00–1.49)

## 2012-02-11 NOTE — Telephone Encounter (Signed)
Spoke with pt, instruct to continue same dose Coumadin. RTC in 1 week for pt/inr. Verbalized understanding.

## 2012-02-11 NOTE — Telephone Encounter (Signed)
Message copied by Dennie Maizes on Thu Feb 11, 2012 10:41 AM ------      Message from: Ellouise Newer III      Created: Thu Feb 11, 2012 10:29 AM       Same dose            PT/INR in 1 week

## 2012-02-18 ENCOUNTER — Encounter (HOSPITAL_BASED_OUTPATIENT_CLINIC_OR_DEPARTMENT_OTHER): Payer: PRIVATE HEALTH INSURANCE

## 2012-02-18 ENCOUNTER — Telehealth (HOSPITAL_COMMUNITY): Payer: Self-pay | Admitting: *Deleted

## 2012-02-18 ENCOUNTER — Other Ambulatory Visit (HOSPITAL_COMMUNITY): Payer: Self-pay | Admitting: Oncology

## 2012-02-18 DIAGNOSIS — I82409 Acute embolism and thrombosis of unspecified deep veins of unspecified lower extremity: Secondary | ICD-10-CM

## 2012-02-18 LAB — PROTIME-INR: Prothrombin Time: 31.3 seconds — ABNORMAL HIGH (ref 11.6–15.2)

## 2012-02-18 NOTE — Telephone Encounter (Signed)
Message copied by Adelene Amas on Thu Feb 18, 2012  2:22 PM ------      Message from: Ellouise Newer III      Created: Thu Feb 18, 2012 12:42 PM       Same dose            PT/INR in one week

## 2012-02-18 NOTE — Telephone Encounter (Signed)
Pt notified to continue same dose coumadin and pt level next Thursday

## 2012-02-18 NOTE — Progress Notes (Signed)
Lab draw today and coumadin instructions relayed to pt.

## 2012-02-25 ENCOUNTER — Encounter (HOSPITAL_BASED_OUTPATIENT_CLINIC_OR_DEPARTMENT_OTHER): Payer: PRIVATE HEALTH INSURANCE

## 2012-02-25 ENCOUNTER — Other Ambulatory Visit (HOSPITAL_COMMUNITY): Payer: Self-pay | Admitting: Oncology

## 2012-02-25 ENCOUNTER — Telehealth (HOSPITAL_COMMUNITY): Payer: Self-pay | Admitting: *Deleted

## 2012-02-25 DIAGNOSIS — I82409 Acute embolism and thrombosis of unspecified deep veins of unspecified lower extremity: Secondary | ICD-10-CM

## 2012-02-25 LAB — PROTIME-INR
INR: 2.38 — ABNORMAL HIGH (ref 0.00–1.49)
Prothrombin Time: 26.4 seconds — ABNORMAL HIGH (ref 11.6–15.2)

## 2012-02-25 MED ORDER — OXYCODONE HCL 5 MG PO CAPS: 10.0000 mg | ORAL_CAPSULE | ORAL | Status: DC | PRN

## 2012-02-25 NOTE — Telephone Encounter (Signed)
Message copied by Dennie Maizes on Thu Feb 25, 2012  3:32 PM ------      Message from: Ellouise Newer III      Created: Thu Feb 25, 2012  3:05 PM       PT/INR in 10 days

## 2012-02-25 NOTE — Telephone Encounter (Signed)
Message left on voice mail, instruct to continue same dose coumadin, RTC 4/5 for lab work.

## 2012-02-25 NOTE — Progress Notes (Signed)
Matthew Wise's reason for visit today is for labs as scheduled per MD orders.  Venipuncture performed with a 23 gauge butterfly needle to R Antecubital. [name] tolerated all procedures well and without incident.  Patient reported Coumadin regimen as of today: Coumadin 12.5mg  alternating with 15mg 

## 2012-03-04 ENCOUNTER — Other Ambulatory Visit (HOSPITAL_COMMUNITY): Payer: Self-pay | Admitting: Oncology

## 2012-03-04 ENCOUNTER — Encounter (HOSPITAL_COMMUNITY): Payer: PRIVATE HEALTH INSURANCE | Attending: Oncology

## 2012-03-04 DIAGNOSIS — I82409 Acute embolism and thrombosis of unspecified deep veins of unspecified lower extremity: Secondary | ICD-10-CM

## 2012-03-04 LAB — PROTIME-INR: Prothrombin Time: 38 seconds — ABNORMAL HIGH (ref 11.6–15.2)

## 2012-03-07 ENCOUNTER — Other Ambulatory Visit (HOSPITAL_COMMUNITY): Payer: PRIVATE HEALTH INSURANCE

## 2012-03-11 ENCOUNTER — Other Ambulatory Visit (HOSPITAL_COMMUNITY): Payer: PRIVATE HEALTH INSURANCE

## 2012-03-16 ENCOUNTER — Encounter (HOSPITAL_BASED_OUTPATIENT_CLINIC_OR_DEPARTMENT_OTHER): Payer: Self-pay | Admitting: Oncology

## 2012-03-16 ENCOUNTER — Other Ambulatory Visit (HOSPITAL_COMMUNITY): Payer: Self-pay | Admitting: Oncology

## 2012-03-16 VITALS — BP 143/88 | HR 81 | Temp 98.4°F | Wt 290.0 lb

## 2012-03-16 DIAGNOSIS — L97909 Non-pressure chronic ulcer of unspecified part of unspecified lower leg with unspecified severity: Secondary | ICD-10-CM

## 2012-03-16 DIAGNOSIS — I82409 Acute embolism and thrombosis of unspecified deep veins of unspecified lower extremity: Secondary | ICD-10-CM

## 2012-03-16 DIAGNOSIS — R21 Rash and other nonspecific skin eruption: Secondary | ICD-10-CM

## 2012-03-16 LAB — DIFFERENTIAL
Basophils Relative: 0 % (ref 0–1)
Eosinophils Absolute: 0.1 10*3/uL (ref 0.0–0.7)
Eosinophils Relative: 1 % (ref 0–5)
Lymphs Abs: 1.5 10*3/uL (ref 0.7–4.0)
Monocytes Absolute: 0.4 10*3/uL (ref 0.1–1.0)
Monocytes Relative: 8 % (ref 3–12)

## 2012-03-16 LAB — COMPREHENSIVE METABOLIC PANEL
Albumin: 3.9 g/dL (ref 3.5–5.2)
Alkaline Phosphatase: 84 U/L (ref 39–117)
BUN: 8 mg/dL (ref 6–23)
Creatinine, Ser: 0.96 mg/dL (ref 0.50–1.35)
GFR calc Af Amer: >90 mL/min (ref >90)
Glucose, Bld: 127 mg/dL — ABNORMAL HIGH (ref 70–99)
Potassium: 3.7 mEq/L (ref 3.5–5.1)
Total Protein: 7.7 g/dL (ref 6.0–8.3)

## 2012-03-16 LAB — PROTIME-INR: Prothrombin Time: 27.3 seconds — ABNORMAL HIGH (ref 11.6–15.2)

## 2012-03-16 LAB — HEMOGLOBIN A1C
Hgb A1c MFr Bld: 5.6 % (ref <5.7)
Mean Plasma Glucose: 114 mg/dL (ref <117)

## 2012-03-16 LAB — CBC
HCT: 48.1 % (ref 39.0–52.0)
Hemoglobin: 17 g/dL (ref 13.0–17.0)
MCH: 30.9 pg (ref 26.0–34.0)
MCHC: 35.3 g/dL (ref 30.0–36.0)
MCV: 87.5 fL (ref 78.0–100.0)
RBC: 5.5 MIL/uL (ref 4.22–5.81)

## 2012-03-16 NOTE — Patient Instructions (Signed)
[facility]  Discharge Instructions  RECOMMENDATIONS MADE BY THE CONSULTANT AND ANY TEST RESULTS WILL BE SENT TO YOUR REFERRING DOCTOR.   EXAM FINDINGS BY MD TODAY AND SIGNS AND SYMPTOMS TO REPORT TO CLINIC OR PRIMARY MD:    MEDICATIONS PRESCRIBED: aloe vera cream on the rash   INSTRUCTIONS GIVEN AND DISCUSSED:   SPECIAL INSTRUCTIONS/FOLLOW-UP: Dermatology referral   I acknowledge that I have been informed and understand all the instructions given to me and received a copy. I do not have any more questions at this time, but understand that I may call the Specialty Clinic at [facility] at [phone] during business hours should I have any further questions or need assistance in obtaining follow-up care.    __________________________________________  _____________  __________ Signature of Patient or Authorized Representative            Date                   Time    __________________________________________ Nurse's Signature

## 2012-03-16 NOTE — Progress Notes (Signed)
Matthew Smiles., MD, MD [address] [place] [address]  1. DVT (deep venous thrombosis)  CBC, Differential, Comprehensive metabolic panel, Hemoglobin A1c, Protime-INR, CBC, Differential, Comprehensive metabolic panel, Hemoglobin A1c    CURRENT THERAPY:On Coumadin 12.5 mg / 15 mg daily.   INTERVAL HISTORY: Matthew Wise 51 y.o. male returns for  regular  visit for followup of DVT.  The patient notes 2 complaints. He is a left lower extremity ulcer. It is nicely around and dressed. He reports it is about the size of a quarter. He reports that it is clean. He does admit to minimal serosanguineous discharge. I recommended that he followup with the wound center for further treatment of this.  Patient also reports a bilateral antecubital pruritic rash that is hyperpigmented.  See physical exam section for more details. The patient reports that they were noted approximately one month ago. They are pruritic.  He has tried anti-itch creams and lotions including calamine lotion, and the antibacterial cream. These have not been beneficial. Lately he simply petroleum jelly on on the rash which has been beneficial for his symptoms.  Otherwise, the patient is doing well. He denies any complaints. He admits to compliant with his Coumadin. I would like to ascertain a hemoglobin A1c with his significant history of lower extremity ulcers. His lower tremor he ulcers may be secondary to peripheral vascular disease, but he reports he is as his primary care physician in a very long time. Therefore think it is wise to ascertain a hemoglobin A1c to rule out diabetic ulcer formation.  ROS: No TIA's or unusual headaches, no dysphagia.  No prolonged cough. No dyspnea or chest pain on exertion.  No abdominal pain, change in bowel habits, black or bloody stools.  No urinary tract symptoms.  No new or unusual musculoskeletal symptoms.    Past Medical History  Diagnosis Date  . DVT of leg (deep  venous thrombosis)     rt. leg  . Inferior vena caval thrombosis     Chronic  . Hypothyroidism   . DVT (deep venous thrombosis) 06/15/2011  . H/O PE (pulmonary embolism) 06/15/2011  . 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      has no known allergies.  Matthew Wise does not currently have medications on file.  No past surgical history on file.  Denies any headaches, dizziness, double vision, fevers, chills, night sweats, nausea, vomiting, diarrhea, constipation, chest pain, heart palpitations, shortness of breath, blood in stool, black tarry stool, urinary pain, urinary burning, urinary frequency, hematuria.   PHYSICAL EXAMINATION  ECOG PERFORMANCE STATUS: 1 - Symptomatic but completely ambulatory  Filed Vitals:   03/16/12 1156  BP: 143/88  Pulse: 81  Temp: 98.4 F (36.9 C)    GENERAL:alert, no distress, well nourished, well developed, comfortable, cooperative, obese and smiling SKIN: skin color, texture, turgor are normal, rash noted in bilateral antecubital areas.  It is hyperpigmented, nummular, and scaly/lichenificated. The right antecubital plaque measures approximately 6-7 cm in long axis. The left antecubital area reveals multiple plaques. This rash appears dry. He also has a satellite area/plaque proximal to the (on the radial side measuring approximately 2 cm round. HEAD: Normocephalic, No masses, lesions, tenderness or abnormalities EYES: normal, Conjunctiva are pink and non-injected EARS: External ears normal OROPHARYNX:lips, buccal mucosa,  and tongue normal and mucous membranes are moist  NECK: supple, trachea midline LYMPH:  not examined BREAST:not examined LUNGS: clear to auscultation  HEART: regular rate & rhythm, no  murmurs, no gallops, S1 normal and S2 normal ABDOMEN:abdomen soft, non-tender, obese and normal bowel sounds BACK: Back symmetric, no curvature., No CVA tenderness EXTREMITIES:less then 2 second capillary refill, no joint deformities, effusion, or inflammation, no edema, no skin discoloration, no clubbing, no cyanosis, positive findings:  Left-sided lower extremity ulcer with a clean dressing in place and wrapped  NEURO: alert & oriented x 3 with fluent speech, no focal motor/sensory deficits, gait normal   LABORATORY DATA: Results for Matthew Wise, Matthew Wise (MRN [id number]) as of 03/16/2012 14:47  Ref. Range 03/16/2012 12:54  Sodium Latest Range: 135-145 mEq/L 138  Potassium Latest Range: 3.5-5.1 mEq/L 3.7  Chloride Latest Range: 96-112 mEq/L 101  CO2 Latest Range: 19-32 mEq/L 27  BUN Latest Range: 6-23 mg/dL 8  Creat Latest Range: 0.50-1.35 mg/dL 1.47  Calcium Latest Range: 8.4-10.5 mg/dL 9.7  GFR calc non Af Amer Latest Range: >90 mL/min >90  GFR calc Af Amer Latest Range: >90 mL/min >90  Glucose Latest Range: 70-99 mg/dL 829 (H)  Alkaline Phosphatase Latest Range: 39-117 U/L 84  Albumin Latest Range: 3.5-5.2 g/dL 3.9  AST Latest Range: 0-37 U/L 32  ALT Latest Range: 0-53 U/L 29  Total Protein Latest Range: 6.0-8.3 g/dL 7.7  Total Bilirubin Latest Range: 0.3-1.2 mg/dL 0.8  WBC Latest Range: 4.0-10.5 K/uL 5.2  RBC Latest Range: 4.22-5.81 MIL/uL 5.50  HGB Latest Range: 13.0-17.0 g/dL 56.2  HCT Latest Range: 39.0-52.0 % 48.1  MCV Latest Range: 78.0-100.0 fL 87.5  MCH Latest Range: 26.0-34.0 pg 30.9  MCHC Latest Range: 30.0-36.0 g/dL 13.0  RDW Latest Range: 11.5-15.5 % 13.9  Platelets Latest Range: 150-400 K/uL 183  Neutrophils Relative Latest Range: 43-77 % 61  Lymphocytes Relative Latest Range: 12-46 % 30  Monocytes Relative Latest Range: 3-12 % 8  Eosinophils Relative Latest Range: 0-5 % 1  Basophils Relative Latest Range: 0-1 % 0  NEUT# Latest Range: 1.7-7.7 K/uL 3.2  Lymphocytes  Absolute Latest Range: 0.7-4.0 K/uL 1.5  Monocytes Absolute Latest Range: 0.1-1.0 K/uL 0.4  Eosinophils Absolute Latest Range: 0.0-0.7 K/uL 0.1  Basophils Absolute Latest Range: 0.0-0.1 K/uL 0.0  Prothrombin Time Latest Range: 11.6-15.2 seconds 27.3 (H)  INR Latest Range: 0.00-1.49  2.49 (H)    PENDING LABS: Hgb A1c    ASSESSMENT:  1. DVT, on coumadin anticoagulation 12.5 mg / 15 mg alternating.  2. Tobacco abuse, 1 ppd with a 30 pack year history. 3.  Bilateral antecubital hyperpigmented, nummular, scaly/lichenificated rash consisting of plaques and is pruritic.  PLAN:  1. Recommended the patient follow-up with wound center for left LE ulcer. 2. Referral to dermatology for B/L antecubital rash described above. 3. Recommended Aloe vera lotion/cream for rash.  Avoid topical antibiotics, steroids, and antifungals until seen by dermatologist. 4. Lab work today: CBC diff, CMET, PT/INR, Hgb A1c 5. Continue Coumadin as directed.  Will require life-long coumadin. 6. Return in 3 months for follow-up.  All questions were answered. The patient knows to call the clinic with any problems, questions or concerns. We can certainly see the patient much sooner if necessary.  The patient and plan discussed with Glenford Peers, MD and he is in agreement with the aforementioned.  Patient seen and examined by Dr. Mariel Sleet for rash.   Moani Weipert

## 2012-03-18 NOTE — Progress Notes (Signed)
Labs drawn

## 2012-03-23 ENCOUNTER — Other Ambulatory Visit (HOSPITAL_COMMUNITY): Payer: Self-pay

## 2012-04-15 ENCOUNTER — Encounter (HOSPITAL_COMMUNITY): Payer: Self-pay | Admitting: Oncology

## 2012-04-15 ENCOUNTER — Ambulatory Visit (HOSPITAL_COMMUNITY)
Admission: RE | Admit: 2012-04-15 | Discharge: 2012-04-15 | Disposition: A | Discharge status: Home / Self Care | Payer: Self-pay | Source: Ambulatory Visit | Attending: Oncology | Admitting: Oncology

## 2012-04-15 ENCOUNTER — Encounter (HOSPITAL_COMMUNITY): Payer: Self-pay | Attending: Oncology | Admitting: Oncology

## 2012-04-15 ENCOUNTER — Encounter (HOSPITAL_COMMUNITY): Payer: Self-pay

## 2012-04-15 VITALS — BP 140/88 | HR 71 | Temp 98.6°F | Wt 290.3 lb

## 2012-04-15 DIAGNOSIS — I872 Venous insufficiency (chronic) (peripheral): Secondary | ICD-10-CM

## 2012-04-15 DIAGNOSIS — M79609 Pain in unspecified limb: Secondary | ICD-10-CM | POA: Insufficient documentation

## 2012-04-15 DIAGNOSIS — I824Y9 Acute embolism and thrombosis of unspecified deep veins of unspecified proximal lower extremity: Secondary | ICD-10-CM | POA: Insufficient documentation

## 2012-04-15 DIAGNOSIS — I82409 Acute embolism and thrombosis of unspecified deep veins of unspecified lower extremity: Secondary | ICD-10-CM

## 2012-04-15 DIAGNOSIS — L97809 Non-pressure chronic ulcer of other part of unspecified lower leg with unspecified severity: Secondary | ICD-10-CM

## 2012-04-15 LAB — PROTIME-INR: Prothrombin Time: 40.2 seconds — ABNORMAL HIGH (ref 11.6–15.2)

## 2012-04-15 IMAGING — US US EXTREM LOW VENOUS*L*
1 series · 14 of 24 positions shown · non-contrast
Comparison: [DATE]

CLINICAL DATA: Left leg pain, history of left lower extremity DVT
[DATE]

LEFT LOWER EXTREMITY VENOUS DUPLEX ULTRASOUND
TECHNIQUE: Gray-scale sonography with graded compression, as well
as color Doppler and duplex ultrasound, were performed to evaluate
the deep venous system of the lower extremity from the level of the
common femoral vein through the popliteal and proximal calf veins.
Spectral Doppler was utilized to evaluate flow at rest and with
distal augmentation maneuvers.

[Series 1: us extrem low venous*left* · 14 of 31 slices shown]
[im 1/31]
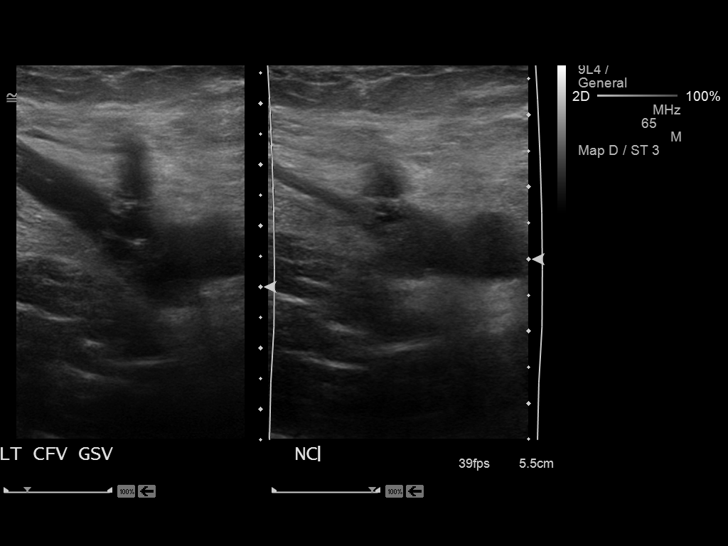
[im 3/31]
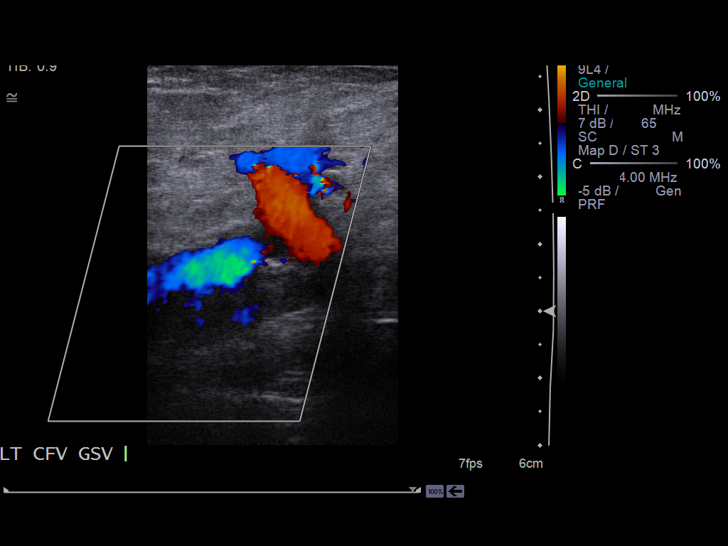
[im 6/31]
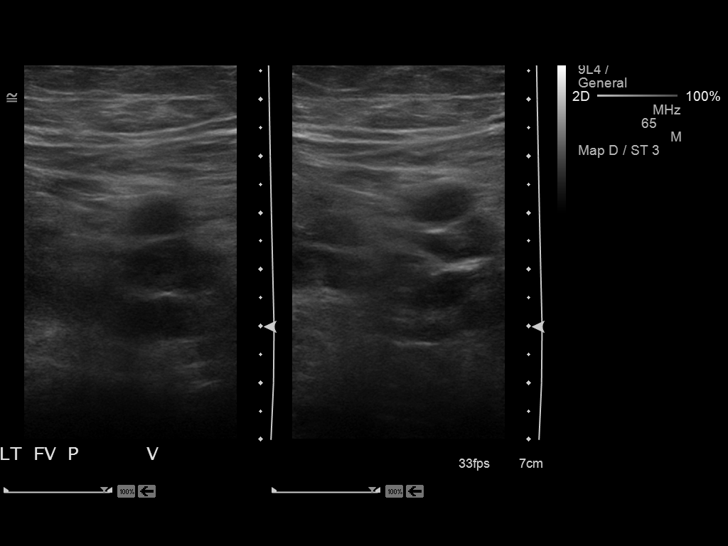
[im 8/31]
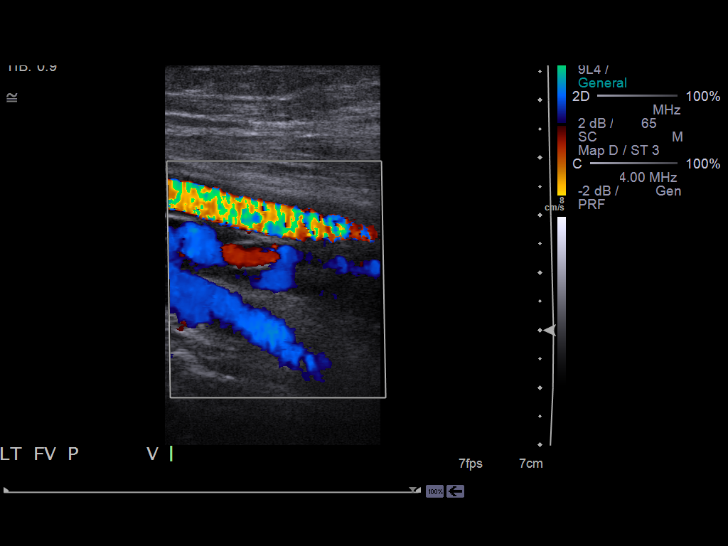
[im 10/31]
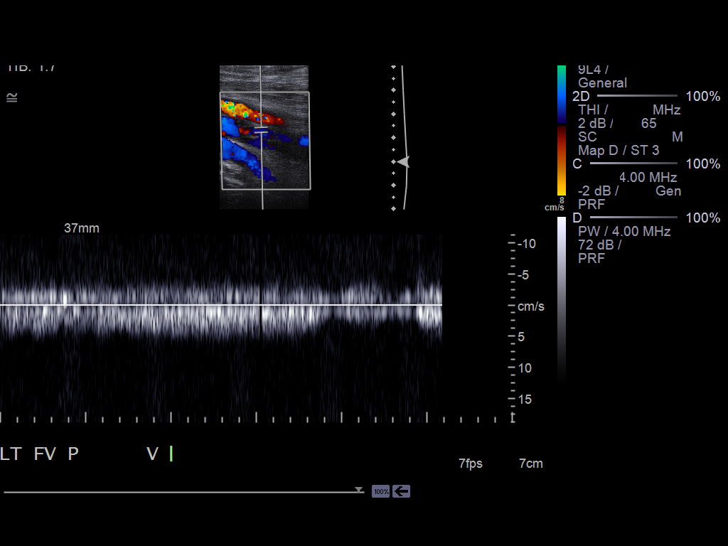
[im 12/31]
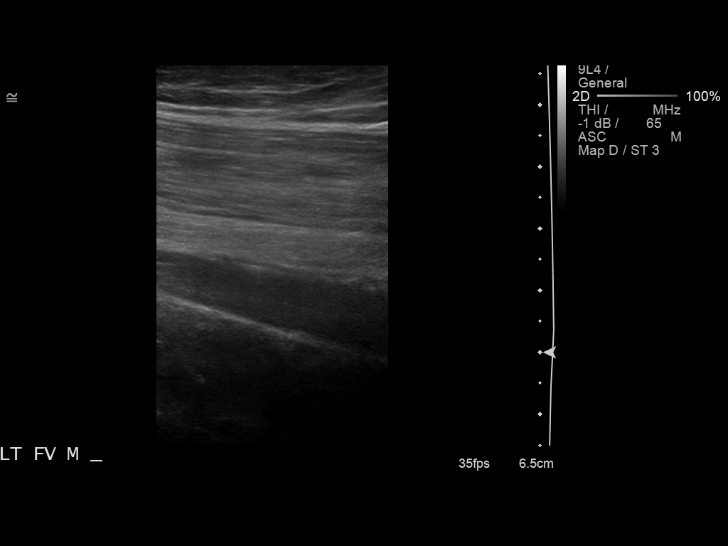
[im 15/31]
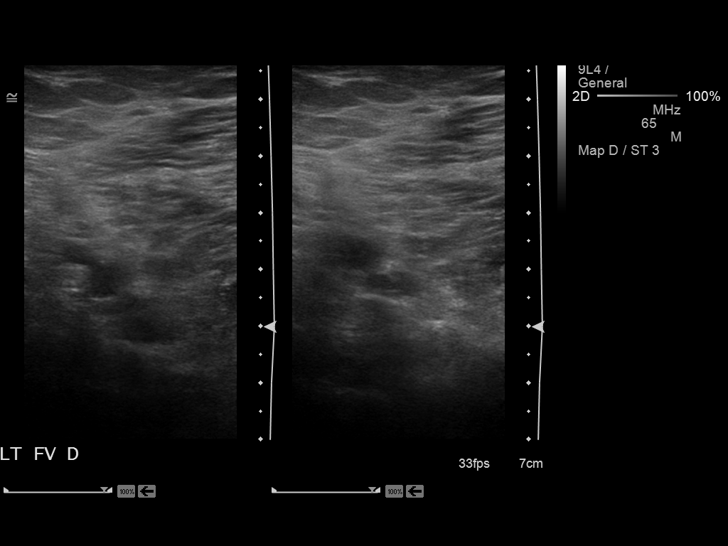
[im 16/31]
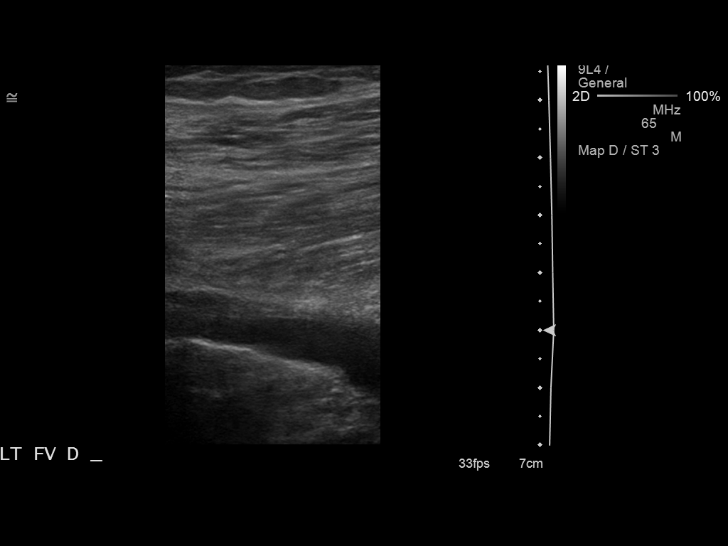
[im 19/31]
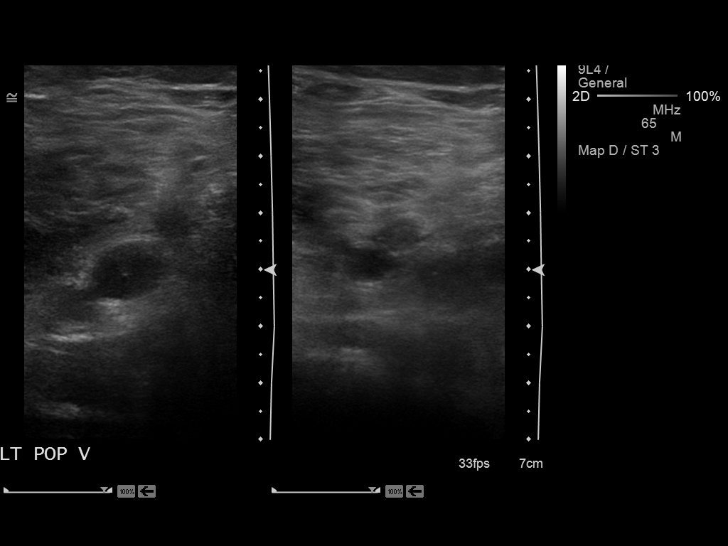
[im 21/31]
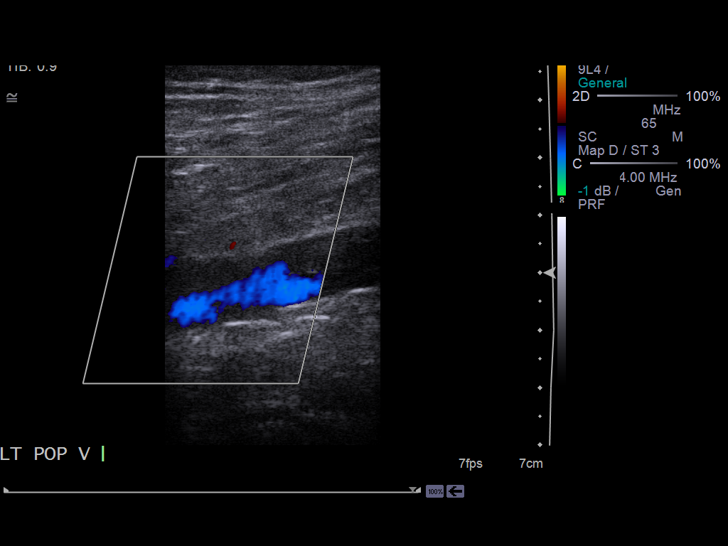
[im 24/31]
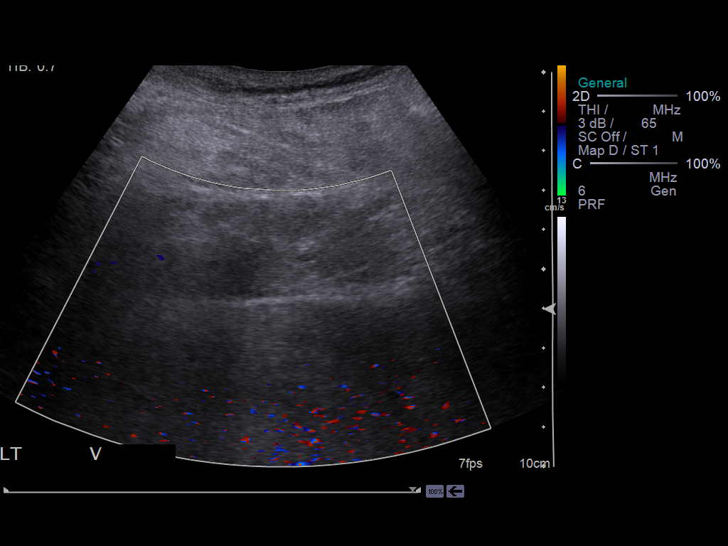
[im 25/31]
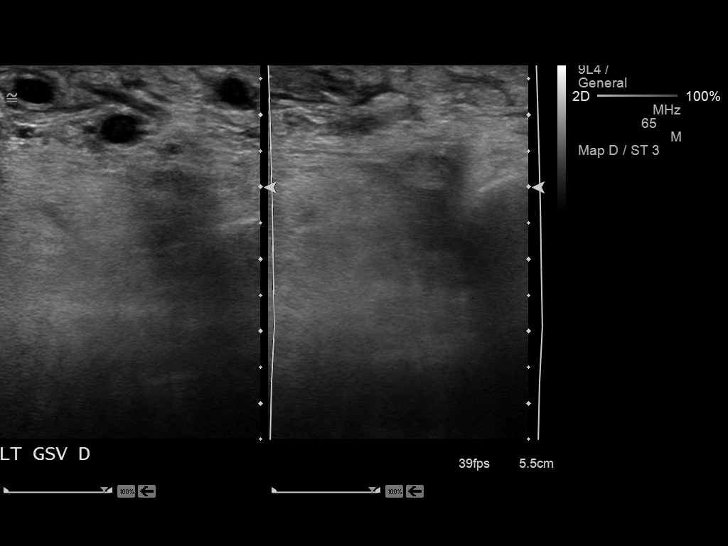
[im 28/31]
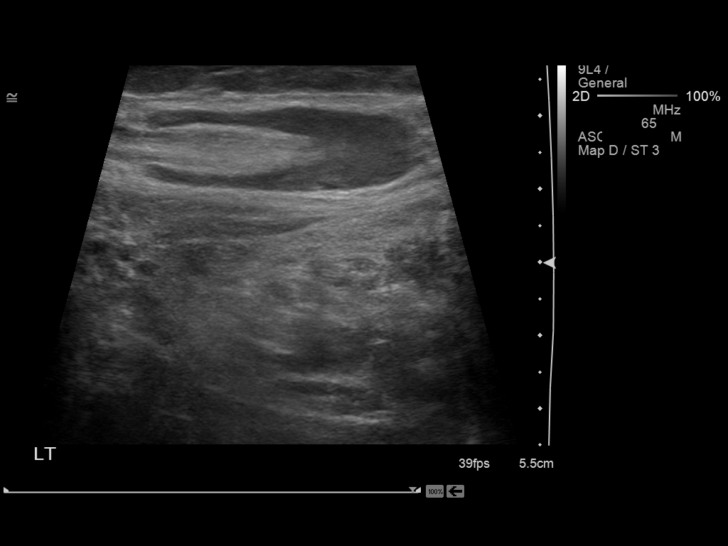
[im 31/31]
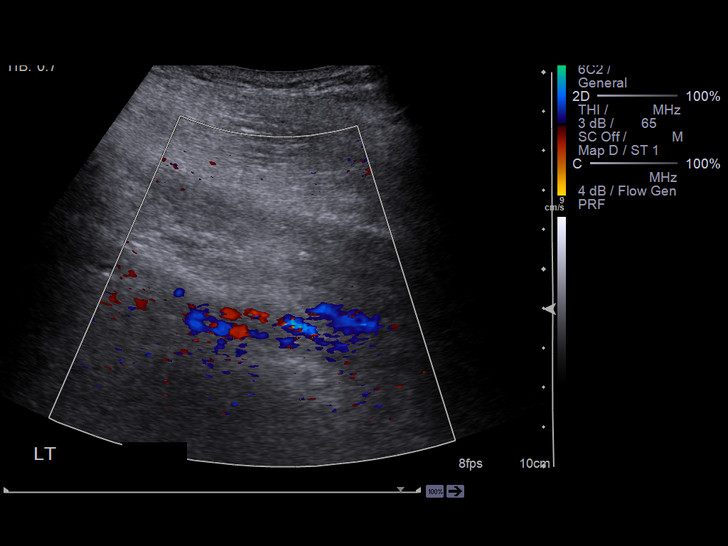

[14 of 24 positions shown; findings below may reference images not displayed]

FINDINGS: Scattered intraluminal echogenic nonocclusive thrombus
within the left common femoral, profunda femoral, femoral, and
popliteal veins.  This may represent sequelae from prior DVT.  No
occlusive segment identified.  Prominent left inguinal lymph node
with a preserved fatty hila.
IMPRESSION: Scattered nonocclusive thrombus/DVT throughout the common femoral,
femoral, profunda femoral and popliteal veins.

## 2012-04-15 MED ORDER — OXYCODONE HCL 5 MG PO CAPS: 10.0000 mg | ORAL_CAPSULE | ORAL | Status: DC | PRN

## 2012-04-15 NOTE — Progress Notes (Signed)
Matthew Wise's reason for visit today are for labs as scheduled per MD orders and an office visit.  Venipuncture performed with a 23 gauge butterfly needle to R Antecubital.  Matthew Wise states he is taking coumadin 12.5mg  alternating with 15mg .  Matthew Wise tolerated venipuncture well and without incident.

## 2012-04-15 NOTE — Patient Instructions (Addendum)
Matthew Wise  [id number] July 17, 1961 Dr. Glenford Peers   [facility]  Discharge Instructions  RECOMMENDATIONS MADE BY THE CONSULTANT AND ANY TEST RESULTS WILL BE SENT TO YOUR REFERRING DOCTOR.   EXAM FINDINGS BY MD TODAY AND SIGNS AND SYMPTOMS TO REPORT TO CLINIC OR PRIMARY MD: Exam findings as discussed by T. Kefalas, PA-C.  SPECIAL INSTRUCTIONS/FOLLOW-UP: 1.  You have an appointment with the wound care specialist here at [facility] on Tuesday, May 21st at 0830am.  You will be going to the Physical Therapy Department on the 1st floor, across from the elevators. 2.  We will be contacting you when we have your consults set-up to see a Dermatologist and a Smoking Cessation counselor. 3.  You will be contacted with any abnormal ultrasound results. 4.  Your INR was 4.08 today, please hold your Coumadin tonight (04/15/12) and tomorrow (04/16/12) - restart it at 12.5mg  on Sunday (04/17/12).  You need to have your INR rechecked on Monday and your schedule is attached.  I acknowledge that I have been informed and understand all the instructions given to me and received a copy. I do not have any more questions at this time, but understand that I may call the Specialty Clinic at [facility] at [phone] during business hours should I have any further questions or need assistance in obtaining follow-up care.   __________________________________________  _____________  __________ Signature of Patient or Authorized Representative            Date                   Time    __________________________________________ Nurse's Signature

## 2012-04-15 NOTE — Progress Notes (Signed)
Matthew Wise is seen as a work in today.  The patient reports left lower tremor the discomfort. 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 He is worsening of his left lower extremity the ulcers on the medial side. This is causing discomfort.  These ulcers are seeping serosanguineous fluid. The most inferior ulcer has a handful of open areas.  They measure between 1 cm to the largest being approximately 2 cm. They appear clean. They do not appear to be infected. He also has an ulcer superior to this cluster. This is also leaking serosanguineous fluid and is subcentimeter in size. We will refer the patient to the wound clinic here in the hospital for treatment of these ulcers.    I provided the patient with extensive amount of education regarding quitting smoking. These are likely venous stasis ulcers. He's been checked for diabetes and it is been unremarkable.  Duo to his venous insufficiency, his tobacco abuse is particularly harmful.  Smoking cessation education is provided today. We will for him to smoking cessation classes.  The patient reports that he never did receive a consultation with dermatology regarding his antecubital rash. Please see previous dictation for more descriptive information regarding this.  Will make sure that he gets a referral for this appointment.  Majority of our time there was utilized to discuss a potential transition to Xarelto for his anticoagulation  Of lifelong anticoagulation. I think this is a solid option for the patient. We spent significant amount time discussing the pros, and cons to Coumadin versus Xarelto. He understands the Coumadin can be reversed with vitamin K and Xarelto has no anecdotal reversal products. He knows that Xarelto has less incidence of bleeding particularly intracranial bleeding. Of note, Xarelto does have higher GI bleeding which can be managed with transfusion of packed red blood cells. So given the patient  the opportunity to think about these options and this can be a certainly ongoing discussion. Transition to Xarelto is quite EC. Once his INR is 3 or below, he can start with 20 mg daily (with food).  Due to his lack of insurance, we would have to get the pharmaceutical company involved with his medication at a reduced cost.  Physical exam: BP 140/88  Pulse 71  Temp(Src) 98.6 F (37 C) (Oral)  Wt 290 lb 4.8 oz (131.679 kg) Gen.: Patient is pleasant. He is not appear vena cava distress. He is alert oriented x3. HEENT: Atraumatic, normocephalic, anicteric sclera Neck: Trachea midline, supple Cardiac regular rate and rhythm without murmur rub or gallop. No S3 or S4 appreciated. Lungs: Clear to auscultation bilaterally without wheezes, rales, or rhonchi. Abdomen: Positive bowel sounds in all 4 quadrants. Soft, nontender. Extremities: Left lower extremity ulcers appreciated on the medial side. Inferior cluster of ulcers largest noted to be approximately 2 cm in size seeping serosanguineous fluid. A smaller superior ulcer is again appreciated on the medial side again leaking serosanguineous fluid. No signs of colored discharge or signs of infection. Clubbing appreciated the fingernails. Left lower extremity the tenderness. Skin: Warm and dry Neuro: Alert and oriented x3. No focal deficits appreciated.  Lab Results  Component Value Date   INR 4.08* 04/15/2012   INR 2.49* 03/16/2012   INR 3.80* 03/04/2012    04/15/2012  *RADIOLOGY REPORT*  Clinical Data: Left leg pain, history of left lower extremity DVT  03/19/2011  LEFT LOWER EXTREMITY VENOUS DUPLEX ULTRASOUND  Technique: Gray-scale sonography with graded compression, as well  as color Doppler and duplex ultrasound, were performed to evaluate  the deep venous system of the lower extremity from the level of the  common femoral vein through the popliteal and proximal calf veins.  Spectral Doppler was utilized to evaluate flow at rest and with    distal augmentation maneuvers.  Comparison: 03/19/2011  Findings: Scattered intraluminal echogenic nonocclusive thrombus  within the left common femoral, profunda femoral, femoral, and  popliteal veins. This may represent sequelae from prior DVT. No  occlusive segment identified. Prominent left inguinal lymph node  with a preserved fatty hila.  IMPRESSION:  Scattered nonocclusive thrombus/DVT throughout the common femoral,  femoral, profunda femoral and popliteal veins.  Original Report Authenticated By: Matthew Wise, M.D.       Assessment: 1. DVT, on coumadin anticoagulation 12.5 mg / 15 mg alternating.  2. Tobacco abuse, 1 ppd with a 30 pack year history. 3. Left lower extremity the tenderness 4. Left lower extremity ulcers, likely venous stasis ulcers, noninfected 5. Bilateral antecubital hyperpigmented pruritic rash, will consult dermatology.  Plan: 1. Left lower extremity Doppler ultrasound to evaluate for DVT. 2. INR appreciated today and noted to be 4.08. Will hold Coumadin for 2 days and then restart at the same dose on Sunday evening. PT/INR on Monday.   3. Dermatology consultation for bilateral antecubital pruritic hyperpigmentation rash in patches. 4. Smoking cessation education provided. 5. Referrals a smoking cessation classes. 6. Will ask our financial counselor, April Manson, to investigate financial help for the use of Chantix. 7. Referral to wound clinic at the [facility]. 8. The long discussion regarding Coumadin versus Xarelto. We discussed the pros and cons of both medications. He understands that Xarelto does not have anecdotal reversal medication unlike Coumadin you can utilize vitamin K to reverse his effects. We did discuss the benefits of Xarelto. The patient will ponder this option. I would be happy to help transition the patient to Xarelto. This transition would include starting Xarelto at 20 mg daily with food once the patient's INR is 3.0 or  less. If he is interested, we will Matthew Wise financial assistance for him regarding this medication. 9. Return to the clinic as scheduled for followup.  All questions were answered. The patient knows to call the clinic with any problems, questions, or concerns.  Caira Poche

## 2012-04-18 ENCOUNTER — Other Ambulatory Visit (HOSPITAL_COMMUNITY): Payer: Self-pay

## 2012-04-19 ENCOUNTER — Ambulatory Visit (HOSPITAL_COMMUNITY)
Admission: RE | Admit: 2012-04-19 | Discharge: 2012-04-19 | Disposition: A | Discharge status: Home / Self Care | Payer: Self-pay | Source: Ambulatory Visit | Attending: Internal Medicine | Admitting: Internal Medicine

## 2012-04-19 DIAGNOSIS — I83009 Varicose veins of unspecified lower extremity with ulcer of unspecified site: Secondary | ICD-10-CM | POA: Insufficient documentation

## 2012-04-19 DIAGNOSIS — IMO0001 Reserved for inherently not codable concepts without codable children: Secondary | ICD-10-CM | POA: Insufficient documentation

## 2012-04-19 NOTE — Progress Notes (Signed)
Physical Therapy - Wound Therapy  Evaluation  Patient Details  Name: Matthew Wise MRN: [id number] Date of Birth: 01-Nov-1961  Today's Date: 04/19/2012 Time: 0900-0930 Time Calculation (min): 30 min  Visit#: 1  of 4   Re-eval: 05/19/12  Wound Therapy Wound 04/19/12 Other (Comment) Leg Right;Posterior draining (Active)  Site / Wound Assessment Dusky 04/19/2012  9:31 AM  % Wound base Red or Granulating 10% 04/19/2012  9:31 AM  % Wound base Yellow 90% 04/19/2012  9:31 AM  Peri-wound Assessment Intact 04/19/2012  9:31 AM  Wound Length (cm) 6.5 cm 04/19/2012  9:31 AM  Wound Width (cm) 2 cm 04/19/2012  9:31 AM  Wound Depth (cm) 0.4 cm 04/19/2012  9:31 AM  Drainage Amount Moderate 04/19/2012  9:31 AM  Drainage Description Serous 04/19/2012  9:31 AM  Non-staged Wound Description Full thickness 04/19/2012  9:31 AM  Treatment Cleansed;Debridement (Selective) 04/19/2012  9:31 AM  Dressing Type Compression wrap 04/19/2012  9:31 AM  Dressing Changed New 04/19/2012  9:31 AM  Dressing Status Clean 04/19/2012  9:31 AM   Selective Debridement Selective Debridement - Location: 4x4 Selective Debridement - Tissue Removed: slough  Second wound posterior aspect 1.5 x 3.0 cm not draining.  Physical Therapy Assessment and Plan Wound Therapy - Assess/Plan/Recommendations Wound Therapy - Clinical Statement: Pt has painful venous statsis wound that is not healing and worsening that will benefit from skilled PT for compression wrapping Factors Delaying/Impairing Wound Healing: Vascular compromise Hydrotherapy Plan: Debridement;Dressing change;Patient/family education Wound Therapy - Frequency:  (1 x /week) Wound Therapy - Current Recommendations: PT      Goals    Problem List Patient Active Problem List  Diagnoses  . Post-phlebitic syndrome  . IVC obstruction  . DVT (deep venous thrombosis)  . H/O PE (pulmonary embolism)  . Congenital inferior vena cava interruption  . Leg wound, left  . IVC (inferior  vena cava obstruction)    GP No functional reporting required  Wenda Vanschaick,CINDY 04/19/2012, 9:39 AM

## 2012-04-26 ENCOUNTER — Telehealth (HOSPITAL_COMMUNITY): Payer: Self-pay

## 2012-04-26 ENCOUNTER — Ambulatory Visit (HOSPITAL_COMMUNITY): Payer: Self-pay | Admitting: Physical Therapy

## 2012-04-28 ENCOUNTER — Ambulatory Visit (HOSPITAL_COMMUNITY)
Admission: RE | Admit: 2012-04-28 | Discharge: 2012-04-28 | Disposition: A | Discharge status: Home / Self Care | Payer: Self-pay | Source: Ambulatory Visit | Attending: Internal Medicine | Admitting: Internal Medicine

## 2012-04-28 NOTE — Progress Notes (Signed)
Physical Therapy - Wound Therapy  Treatment  Patient Details  Name: Matthew Wise MRN: [id number] Date of Birth: 12-06-1960  Today's Date: 04/28/2012 Time: 4098-1191 Time Calculation (min): 25 min  Visit#: 2  of 4   Re-eval: 05/19/12 Selective debridement < 20 cm Wound Therapy  04/28/12 1052  Subjective Assessment  Subjective Pt reported dressings fell off, stated they were removed this morning came into dept with no dressings on.  Pain scale 2-3/10.  Wound 04/19/12 Other (Comment) Leg Right;Posterior draining  Date First Assessed: 04/19/12   Wound Type: (c) Other (Comment)  Location: Leg  Location Orientation: Right;Posterior  Wound Description (Comments): draining  Present on Admission: Yes  Site / Wound Assessment Dusky  % Wound base Red or Granulating 20%  % Wound base Yellow 80%  Drainage Amount Moderate  Drainage Description Serous  Non-staged Wound Description Full thickness  Treatment Cleansed;Debridement (Selective)  Dressing Type Compression wrap (Profore)  Dressing Changed New  Dressing Status Clean  Selective Debridement  Selective Debridement - Location lower L LE  Selective Debridement - Tools Used Forceps  Selective Debridement - Tissue Removed slough     Selective Debridement Selective Debridement - Location: lower L LE Selective Debridement - Tools Used: Forceps Selective Debridement - Tissue Removed: slough   Physical Therapy Assessment and Plan Wound Therapy - Assess/Plan/Recommendations Wound Therapy - Clinical Statement: Able to remove 10% yellow slough without c/ot with increased granulation noted following debridement.  Dressings with 4x4 and profore for compression wrapping. Hydrotherapy Plan: Debridement;Dressing change      Goals    Problem List Patient Active Problem List  Diagnoses  . Post-phlebitic syndrome  . IVC obstruction  . DVT (deep venous thrombosis)  . H/O PE (pulmonary embolism)  . Congenital inferior vena cava  interruption  . Leg wound, left  . IVC (inferior vena cava obstruction)    GP No functional reporting required  Juel Burrow, PTA 04/28/2012, 10:57 AM

## 2012-05-06 ENCOUNTER — Ambulatory Visit (HOSPITAL_COMMUNITY)
Admission: RE | Admit: 2012-05-06 | Discharge: 2012-05-06 | Disposition: A | Discharge status: Home / Self Care | Payer: Self-pay | Source: Ambulatory Visit | Attending: Internal Medicine | Admitting: Internal Medicine

## 2012-05-06 DIAGNOSIS — IMO0001 Reserved for inherently not codable concepts without codable children: Secondary | ICD-10-CM | POA: Insufficient documentation

## 2012-05-06 DIAGNOSIS — I83009 Varicose veins of unspecified lower extremity with ulcer of unspecified site: Secondary | ICD-10-CM | POA: Insufficient documentation

## 2012-05-06 NOTE — Progress Notes (Signed)
Physical Therapy - Wound Therapy  Treatment   Patient Details  Name: Matthew Wise MRN: [id number] Date of Birth: 07-22-1961  Today's Date: 05/06/2012 Time: 1021-1049 Time Calculation (min): 28 min Charges: Deb < 20 cm  Visit#: 3  of 4   Re-eval: 05/19/12  Subjective Subjective Assessment Subjective: He reports his leg feels pretty good. The dressing continues to fall off.   Pain Assessment Pain Assessment Pain Assessment: No/denies pain  Wound Therapy Wound 04/19/12 Other (Comment) Leg Right;Posterior draining (Active)  Site / Wound Assessment Dusky 04/28/2012 10:52 AM  % Wound base Red or Granulating 95% 05/06/2012 10:42 AM  % Wound base Yellow 5% 05/06/2012 10:42 AM  % Wound base Black 0% 05/06/2012 10:42 AM  Peri-wound Assessment Intact 05/06/2012 10:42 AM  Wound Length (cm) 2.5 cm 05/06/2012 10:42 AM  Wound Width (cm) 0.5 cm 05/06/2012 10:42 AM  Wound Depth (cm) 0.1 cm 05/06/2012 10:42 AM  Drainage Amount Minimal 05/06/2012 10:42 AM  Drainage Description Serous 05/06/2012 10:42 AM  Non-staged Wound Description Full thickness 04/28/2012 10:52 AM  Treatment Cleansed;Debridement (Selective) 05/06/2012 10:42 AM  Dressing Type Compression wrap;Gauze (Comment) 05/06/2012 10:42 AM  Dressing Changed New 05/06/2012 10:42 AM  Dressing Status Clean 05/06/2012 10:42 AM   Selective Debridement Selective Debridement - Location: L lower leg.  Cleansed with BD E-Z scrub and saline  Selective Debridement - Tools Used: Forceps Selective Debridement - Tissue Removed: slough and dry skin,  Covered with Gauze, island dressing and ACE wrap   Physical Therapy Assessment and Plan Wound Therapy - Assess/Plan/Recommendations Wound Therapy - Clinical Statement: Measurments taken todayMr. Wise has attended 3 OP PT visits to address his L LE wound.  His wound is located on the medial side of L lower leg and presents as a horse shoe with tiny wounds around it.  The periwound is darkened and has raised bumps and slight  edema.  Educated pt to continue with pressure dressing and change dressing every two days (not everyday) to allow for greater healing.  Recommend he continue in 2 weeks to check his status and complete debridment if necessary.  Hydrotherapy Plan: Debridement;Dressing change Wound Plan: Cont x1 more visit per initial POC in 2 weeks.       Goals    Problem List Patient Active Problem List  Diagnoses  . Post-phlebitic syndrome  . IVC obstruction  . DVT (deep venous thrombosis)  . H/O PE (pulmonary embolism)  . Congenital inferior vena cava interruption  . Leg wound, left  . IVC (inferior vena cava obstruction)    GP No functional reporting required  Matthew Wise 05/06/2012, 11:01 AM

## 2012-05-16 ENCOUNTER — Ambulatory Visit (HOSPITAL_COMMUNITY): Payer: Self-pay | Admitting: Physical Therapy

## 2012-05-17 ENCOUNTER — Ambulatory Visit (HOSPITAL_COMMUNITY)
Admission: RE | Admit: 2012-05-17 | Discharge: 2012-05-17 | Disposition: A | Discharge status: Home / Self Care | Payer: Self-pay | Source: Ambulatory Visit | Attending: Internal Medicine | Admitting: Internal Medicine

## 2012-05-17 NOTE — Progress Notes (Signed)
Physical Therapy - Wound Therapy  Treatment   Patient Details  Name: NINA HOAR MRN: [id number] Date of Birth: [birth year]  Today's Date: 05/17/2012 Time: 7829-5621 Time Calculation (min): 23 min Charges: Deb < 20 cm Visit#: 4  of 12   Re-eval: 06/03/12  Subjective Subjective Assessment Subjective: he is changing his dressings every 2-3 days at home and wrapping with his ace bandage.   Pain Assessment Pain Assessment Pain Assessment: No/denies pain  Wound Therapy Wound 04/19/12 Other (Comment) Leg Right;Posterior draining (Active)  Site / Wound Assessment Dusky 04/28/2012 10:52 AM  % Wound base Red or Granulating 95% 05/17/2012 10:57 AM  % Wound base Yellow 5% 05/17/2012 10:57 AM  % Wound base Black 0% 05/17/2012 10:57 AM  Peri-wound Assessment Intact 05/17/2012 10:57 AM  Wound Length (cm) 2.5 cm 05/06/2012 10:42 AM  Wound Width (cm) 0.5 cm 05/06/2012 10:42 AM  Wound Depth (cm) 0.1 cm 05/06/2012 10:42 AM  Drainage Amount Minimal 05/17/2012 10:57 AM  Drainage Description Serous 05/17/2012 10:57 AM  Non-staged Wound Description Full thickness 04/28/2012 10:52 AM  Treatment Cleansed;Debridement (Selective) 05/17/2012 10:57 AM  Dressing Type Compression wrap;Gauze (Comment);Hydrogel;Island dressing;Moist to moist 05/17/2012 10:57 AM  Dressing Changed New 05/06/2012 10:42 AM  Dressing Status Clean 05/17/2012 10:57 AM   Selective Debridement Selective Debridement - Location: L lower leg.  Cleansed with wash cloth and warm water Selective Debridement - Tools Used: Forceps Selective Debridement - Tissue Removed: slough and dry skin   Physical Therapy Assessment and Plan Wound Therapy - Assess/Plan/Recommendations Wound Therapy - Clinical Statement: after treatment, granulation continues to improve with healthy redness underneath.  Has moderate pain with debridment.  Wound Plan: Continue 2x/week for 4 weeks.  Add LASER to improve healing time.       Goals    Problem List Patient Active  Problem List  Diagnosis  . Post-phlebitic syndrome  . IVC obstruction  . DVT (deep venous thrombosis)  . H/O PE (pulmonary embolism)  . Congenital inferior vena cava interruption  . Leg wound, left  . IVC (inferior vena cava obstruction)    GP No functional reporting required  Dajohn Ellender 05/17/2012, 11:01 AM

## 2012-05-19 ENCOUNTER — Ambulatory Visit (HOSPITAL_COMMUNITY)
Admission: RE | Admit: 2012-05-19 | Discharge: 2012-05-19 | Disposition: A | Discharge status: Home / Self Care | Payer: Self-pay | Source: Ambulatory Visit | Attending: Internal Medicine | Admitting: Internal Medicine

## 2012-05-19 NOTE — Progress Notes (Signed)
Physical Therapy - Wound Therapy  Treatment   Patient Details  Name: Matthew Wise MRN: [id number] Date of Birth: [birth year]  Today's Date: 05/19/2012 Time: 4098-1191 Time Calculation (min): 23 min  Visit#: 5  of 12   Re-eval: 06/03/12 Charge: selective debridement < 20 cm  Subjective Subjective Assessment Subjective: No pain today.  Pain Assessment Pain Assessment Pain Assessment: No/denies pain  Wound Therapy  05/19/12 0957  Subjective Assessment  Subjective No pain today.  Wound 04/19/12 Other (Comment) Leg Right;Posterior draining  Date First Assessed: 04/19/12   Wound Type: (c) Other (Comment)  Location: Leg  Location Orientation: Right;Posterior  Wound Description (Comments): draining  Present on Admission: Yes  % Wound base Red or Granulating 95%  % Wound base Yellow 5%  % Wound base Black 0%  Peri-wound Assessment Intact  Drainage Amount Minimal  Drainage Description Serous  Treatment Cleansed;Debridement (Selective);Other (Comment) (laser therapy)  Dressing Type Compression wrap;Gauze (Comment);Hydrogel;Island dressing;Moist to moist  Dressing Status Clean  Selective Debridement  Selective Debridement - Location L lower leg  Selective Debridement - Tools Used Forceps  Selective Debridement - Tissue Removed yellow slough and dry skin  Wound Therapy - Assess/Plan/Recommendations  Wound Therapy - Clinical Statement Began laser therapy to increase circulation to promote healing.  Pt tolerated well towards total treatment with no c/o pain.  Granulation continues to improve with healthy redness underneath.  Hydrotherapy Plan Debridement;Dressing change;Other (comment) (laser therapy)  Wound Plan Continue with selective debridement and laser therapy    Selective Debridement Selective Debridement - Location: L lower leg Selective Debridement - Tools Used: Forceps Selective Debridement - Tissue Removed: yellow slough and dry skin  Laser therapy: muscle and  tenden increase circulation chronic continous  60.0 J/cm2 3 x 1:24 L lower leg Physical Therapy Assessment and Plan Wound Therapy - Assess/Plan/Recommendations Wound Therapy - Clinical Statement: Began laser therapy to increase circulation to promote healing.  Pt tolerated well towards total treatment with no c/o pain.  Granulation continues to improve with healthy redness underneath. Hydrotherapy Plan: Debridement;Dressing change;Other (comment) (laser therapy) Wound Plan: Continue with selective debridement and laser therapy      Goals    Problem List Patient Active Problem List  Diagnosis  . Post-phlebitic syndrome  . IVC obstruction  . DVT (deep venous thrombosis)  . H/O PE (pulmonary embolism)  . Congenital inferior vena cava interruption  . Leg wound, left  . IVC (inferior vena cava obstruction)    GP No functional reporting required  Juel Burrow, PTA 05/19/2012, 10:11 AM

## 2012-05-24 ENCOUNTER — Ambulatory Visit (HOSPITAL_COMMUNITY): Payer: Self-pay | Admitting: Physical Therapy

## 2012-05-26 ENCOUNTER — Ambulatory Visit (HOSPITAL_COMMUNITY)
Admission: RE | Admit: 2012-05-26 | Discharge: 2012-05-26 | Disposition: A | Discharge status: Home / Self Care | Payer: Self-pay | Source: Ambulatory Visit | Attending: Internal Medicine | Admitting: Internal Medicine

## 2012-05-26 NOTE — Progress Notes (Signed)
Physical Therapy - Wound Therapy  Treatment   Patient Details  Name: Matthew Wise MRN: [id number] Date of Birth: 24-Nov-1961  Today's Date: 05/26/2012 Time: 7829-5621 Time Calculation (min): 37 min Visit#: 6  of 12   Re-eval: 06/03/12 Charges:  Deb <20cm, laser therapy X 9' (n/c)  Subjective: Pt. states he removed his bandages prior to therapy. States it has not been draining much but itches bad  Wound Therapy Wound 04/19/12 Other (Comment) Leg Right;Posterior draining (Active)  Site / Wound Assessment Dusky 04/28/2012 10:52 AM  % Wound base Red or Granulating 95% 05/26/2012  5:00 PM  % Wound base Yellow 5% 05/26/2012  5:00 PM  % Wound base Black 0% 05/26/2012  5:00 PM  Peri-wound Assessment Intact 05/26/2012  5:00 PM  Wound Length (cm) 2.5 cm 05/06/2012 10:42 AM  Wound Width (cm) 0.5 cm 05/06/2012 10:42 AM  Wound Depth (cm) 0.1 cm 05/06/2012 10:42 AM  Drainage Amount Scant 05/26/2012  5:00 PM  Drainage Description Serous 05/26/2012  5:00 PM  Non-staged Wound Description Full thickness 04/28/2012 10:52 AM  Treatment Cleansed;Debridement (Selective) 05/26/2012  5:00 PM  Dressing Type Compression wrap;Gauze (Comment);Hydrogel;Island dressing;Moist to moist 05/26/2012  5:00 PM  Dressing Changed New 05/06/2012 10:42 AM  Dressing Status Clean;Dry;Intact 05/26/2012  5:00 PM   Selective Debridement Selective Debridement - Location: L lower leg Selective Debridement - Tools Used: Forceps Selective Debridement - Tissue Removed: yellow slough and dry skin   Laser therapy: muscle and tendon to increase circulation chronic continous 60.0 J/cm2 3 x 1:24 L lower leg X 6 placements   Physical Therapy Assessment and Plan Wound Therapy - Assess/Plan/Recommendations Wound Therapy - Clinical Statement: Continued use of Laser today to increase circulation/promote healing of wounds.  Wound appear to be fungal in nature.  Discussed with PT and contacted MD to request prescription fungal med to apply to wound.   Continued dressing with hydrogel and compression using coban from base of MT to patellar tendon Hydrotherapy Plan: Debridement;Dressing change;Other (comment) Wound Plan: Continue with selective debridement and laser therapy.  Apply antifungal to wound if MD approves/pt. brings next visit.  Re-evaluate next week      Problem List Patient Active Problem List  Diagnosis  . Post-phlebitic syndrome  . IVC obstruction  . DVT (deep venous thrombosis)  . H/O PE (pulmonary embolism)  . Congenital inferior vena cava interruption  . Leg wound, left  . IVC (inferior vena cava obstruction)    Lurena Nida, PTA/CLT 05/26/2012, 5:59 PM

## 2012-05-31 ENCOUNTER — Ambulatory Visit (HOSPITAL_COMMUNITY): Payer: Self-pay | Admitting: Physical Therapy

## 2012-06-03 ENCOUNTER — Ambulatory Visit (HOSPITAL_COMMUNITY): Payer: Self-pay | Admitting: Physical Therapy

## 2012-06-15 ENCOUNTER — Ambulatory Visit (HOSPITAL_COMMUNITY): Payer: Self-pay | Admitting: Oncology

## 2012-07-05 ENCOUNTER — Telehealth (HOSPITAL_COMMUNITY): Payer: Self-pay | Admitting: *Deleted

## 2012-07-05 NOTE — Telephone Encounter (Signed)
Pt was not available to take a call at home. I spoke with his mother and she said she would pass the message along to him to call us to get an appointment to have his blood checked. She said she would.

## 2012-07-05 NOTE — Telephone Encounter (Signed)
Message copied by Oda Kilts on Tue Jul 05, 2012 10:08 AM ------      Message from: Ellouise Newer III      Created: Tue Jul 05, 2012  9:19 AM       Contact patient.  He has not had an INR since May.  Looks like he was a no show.  He needs an INR if he is taking Coumadin.

## 2012-07-06 ENCOUNTER — Other Ambulatory Visit (HOSPITAL_COMMUNITY): Payer: Self-pay | Admitting: Oncology

## 2012-07-06 ENCOUNTER — Encounter (HOSPITAL_COMMUNITY): Payer: Self-pay | Attending: Oncology

## 2012-07-06 DIAGNOSIS — I82409 Acute embolism and thrombosis of unspecified deep veins of unspecified lower extremity: Secondary | ICD-10-CM

## 2012-07-06 LAB — PROTIME-INR: INR: 1.07 (ref 0.00–1.49)

## 2012-07-06 NOTE — Progress Notes (Signed)
Labs drawn today for pt 

## 2012-07-11 ENCOUNTER — Other Ambulatory Visit (HOSPITAL_COMMUNITY): Payer: Self-pay | Admitting: Oncology

## 2012-07-11 ENCOUNTER — Encounter (HOSPITAL_BASED_OUTPATIENT_CLINIC_OR_DEPARTMENT_OTHER): Payer: Self-pay

## 2012-07-11 DIAGNOSIS — I82409 Acute embolism and thrombosis of unspecified deep veins of unspecified lower extremity: Secondary | ICD-10-CM

## 2012-07-11 NOTE — Progress Notes (Signed)
Labs drawn today for pt 

## 2012-07-15 ENCOUNTER — Encounter (HOSPITAL_BASED_OUTPATIENT_CLINIC_OR_DEPARTMENT_OTHER): Payer: Self-pay

## 2012-07-15 ENCOUNTER — Other Ambulatory Visit (HOSPITAL_COMMUNITY): Payer: Self-pay | Admitting: Oncology

## 2012-07-15 DIAGNOSIS — I82409 Acute embolism and thrombosis of unspecified deep veins of unspecified lower extremity: Secondary | ICD-10-CM

## 2012-07-15 LAB — PROTIME-INR: Prothrombin Time: 17.7 seconds — ABNORMAL HIGH (ref 11.6–15.2)

## 2012-07-15 NOTE — Progress Notes (Signed)
Labs drawn today for pt 

## 2012-07-18 ENCOUNTER — Encounter (HOSPITAL_BASED_OUTPATIENT_CLINIC_OR_DEPARTMENT_OTHER): Payer: Self-pay | Admitting: Oncology

## 2012-07-18 ENCOUNTER — Telehealth (HOSPITAL_COMMUNITY): Payer: Self-pay | Admitting: *Deleted

## 2012-07-18 VITALS — BP 165/99 | HR 74 | Temp 98.1°F | Wt 286.8 lb

## 2012-07-18 DIAGNOSIS — I871 Compression of vein: Secondary | ICD-10-CM

## 2012-07-18 DIAGNOSIS — I2699 Other pulmonary embolism without acute cor pulmonale: Secondary | ICD-10-CM

## 2012-07-18 DIAGNOSIS — I82409 Acute embolism and thrombosis of unspecified deep veins of unspecified lower extremity: Secondary | ICD-10-CM

## 2012-07-18 DIAGNOSIS — D6859 Other primary thrombophilia: Secondary | ICD-10-CM

## 2012-07-18 LAB — PROTIME-INR
INR: 1.88 — ABNORMAL HIGH (ref 0.00–1.49)
Prothrombin Time: 21.9 seconds — ABNORMAL HIGH (ref 11.6–15.2)

## 2012-07-18 NOTE — Patient Instructions (Addendum)
BADR PIEDRA  DOB [birth year] CSN [id number]  MRN [id number] Dr. Glenford Peers   [facility]  Discharge Instructions  RECOMMENDATIONS MADE BY THE CONSULTANT AND ANY TEST RESULTS WILL BE SENT TO YOUR REFERRING DOCTOR.   EXAM FINDINGS BY MD TODAY AND SIGNS AND SYMPTOMS TO REPORT TO CLINIC OR PRIMARY MD: we will check a PT level today.  If you INR is ok we will change you to Coumadin 15mg , 15mg , 12.5mg  , etc.  I will call you to let you know.  Will get you scheduled to see Dr, Mikel Cella hopefully in 3 weeks.  MEDICATIONS PRESCRIBED: none   INSTRUCTIONS GIVEN AND DISCUSSED: Other :  Report sudden chest discomfort, swelling in either extremity, etc.  SPECIAL INSTRUCTIONS/FOLLOW-UP: Return to Clinic in 4 weeks after seeing Dr. Mikel Cella.   I acknowledge that I have been informed and understand all the instructions given to me and received a copy. I do not have any more questions at this time, but understand that I may call the Specialty Clinic at [facility] at [phone] during business hours should I have any further questions or need assistance in obtaining follow-up care.    __________________________________________  _____________  __________ Signature of Patient or Authorized Representative            Date                   Time    __________________________________________ Nurse's Signature

## 2012-07-19 NOTE — Progress Notes (Signed)
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 To that end we have recommended a consultation at [facility] with Dr.Knovich. His lack of perfect attendance here at the clinic for Coumadin checks is well-documented. He is having a difficult time finding a job but I suspect is probably due to his incapacity to be on his feet. His recent visit to the wound clinic for his left leg ulcerations bespeaks of the chronic problems he has experienced over the last 8-10 years.  His legs are both very chronically swollen indurated with a chronic changes of the skin that you see with chronic DVTs etc.  Even though he has not been here for many of his required PTs I do think he is unable to work and we'll also see what Dr. Mikel Cella thinks about this issue. He may be a candidate for xarelto. I will see him right after his visit to [facility]. I first saw this gentleman in August of 2004. At that time he had extensive left leg DVT with chronic changes, congenital interruption of the inferior cava and a large DVT in the proximal right iliac vein

## 2012-07-22 ENCOUNTER — Encounter (HOSPITAL_BASED_OUTPATIENT_CLINIC_OR_DEPARTMENT_OTHER): Payer: Self-pay

## 2012-07-22 DIAGNOSIS — I82409 Acute embolism and thrombosis of unspecified deep veins of unspecified lower extremity: Secondary | ICD-10-CM

## 2012-07-22 LAB — PROTIME-INR
INR: 2.81 — ABNORMAL HIGH (ref 0.00–1.49)
Prothrombin Time: 30 s — ABNORMAL HIGH (ref 11.6–15.2)

## 2012-07-22 NOTE — Progress Notes (Signed)
Labs drawn today for pt 

## 2012-07-26 ENCOUNTER — Encounter (HOSPITAL_BASED_OUTPATIENT_CLINIC_OR_DEPARTMENT_OTHER): Payer: Self-pay

## 2012-07-26 DIAGNOSIS — I82409 Acute embolism and thrombosis of unspecified deep veins of unspecified lower extremity: Secondary | ICD-10-CM

## 2012-07-26 LAB — PROTIME-INR: INR: 3.75 — ABNORMAL HIGH (ref 0.00–1.49)

## 2012-07-26 NOTE — Progress Notes (Signed)
Labs drawn today for pt 

## 2012-08-02 ENCOUNTER — Encounter (HOSPITAL_COMMUNITY): Payer: Self-pay | Attending: Oncology

## 2012-08-02 ENCOUNTER — Other Ambulatory Visit (HOSPITAL_COMMUNITY): Payer: Self-pay | Admitting: Oncology

## 2012-08-02 DIAGNOSIS — I82409 Acute embolism and thrombosis of unspecified deep veins of unspecified lower extremity: Secondary | ICD-10-CM

## 2012-08-02 NOTE — Progress Notes (Signed)
Labs drawn today for pt 

## 2012-08-04 ENCOUNTER — Telehealth (HOSPITAL_COMMUNITY): Payer: Self-pay | Admitting: *Deleted

## 2012-08-04 MED ORDER — DARBEPOETIN ALFA-POLYSORBATE 500 MCG/ML IJ SOLN
INTRAMUSCULAR | Status: AC
  Filled 2012-08-04: qty 1

## 2012-08-04 NOTE — Telephone Encounter (Signed)
Pt reminded of consult at [facility] with Clemetine Marker [phone]). Stressed to pt that this is important and may also help with his receiving disability

## 2012-08-05 MED ORDER — PEGFILGRASTIM INJECTION 6 MG/0.6ML
SUBCUTANEOUS | Status: AC
  Filled 2012-08-05: qty 0.6

## 2012-08-09 ENCOUNTER — Other Ambulatory Visit (HOSPITAL_COMMUNITY): Payer: Self-pay

## 2012-08-12 ENCOUNTER — Encounter (HOSPITAL_BASED_OUTPATIENT_CLINIC_OR_DEPARTMENT_OTHER): Payer: Self-pay

## 2012-08-12 ENCOUNTER — Other Ambulatory Visit (HOSPITAL_COMMUNITY): Payer: Self-pay | Admitting: Oncology

## 2012-08-12 DIAGNOSIS — I82409 Acute embolism and thrombosis of unspecified deep veins of unspecified lower extremity: Secondary | ICD-10-CM

## 2012-08-12 NOTE — Progress Notes (Signed)
Labs drawn today for pt 

## 2012-08-18 ENCOUNTER — Ambulatory Visit (HOSPITAL_COMMUNITY): Payer: Self-pay | Admitting: Oncology

## 2012-08-18 ENCOUNTER — Encounter (HOSPITAL_COMMUNITY): Payer: Self-pay | Admitting: Oncology

## 2012-08-22 ENCOUNTER — Encounter (HOSPITAL_BASED_OUTPATIENT_CLINIC_OR_DEPARTMENT_OTHER): Payer: Self-pay

## 2012-08-22 DIAGNOSIS — I82409 Acute embolism and thrombosis of unspecified deep veins of unspecified lower extremity: Secondary | ICD-10-CM

## 2012-08-22 LAB — PROTIME-INR
INR: 1.9 — ABNORMAL HIGH (ref 0.00–1.49)
Prothrombin Time: 21.1 seconds — ABNORMAL HIGH (ref 11.6–15.2)

## 2012-08-23 ENCOUNTER — Other Ambulatory Visit (HOSPITAL_COMMUNITY): Payer: Self-pay

## 2012-08-29 DIAGNOSIS — D573 Sickle-cell trait: Secondary | ICD-10-CM | POA: Insufficient documentation

## 2012-08-30 ENCOUNTER — Other Ambulatory Visit (HOSPITAL_COMMUNITY): Payer: Self-pay

## 2012-08-31 ENCOUNTER — Telehealth (HOSPITAL_COMMUNITY): Payer: Self-pay | Admitting: *Deleted

## 2012-08-31 NOTE — Telephone Encounter (Signed)
Labs obtained from [facility] and INR 1.96. Notified to continue same dose coumadin and inr in 1 week.

## 2012-09-07 ENCOUNTER — Other Ambulatory Visit (HOSPITAL_COMMUNITY): Payer: Self-pay

## 2012-09-12 ENCOUNTER — Encounter (HOSPITAL_COMMUNITY): Payer: Self-pay | Attending: Oncology

## 2012-09-12 DIAGNOSIS — I82409 Acute embolism and thrombosis of unspecified deep veins of unspecified lower extremity: Secondary | ICD-10-CM | POA: Insufficient documentation

## 2012-09-12 LAB — PROTIME-INR: INR: 4.18 — ABNORMAL HIGH (ref 0.00–1.49)

## 2012-09-12 MED ORDER — OXYCODONE HCL 5 MG PO CAPS: 10.0000 mg | ORAL_CAPSULE | ORAL | Status: DC | PRN

## 2012-09-12 NOTE — Progress Notes (Signed)
Labs drawn today for pt 

## 2012-09-22 ENCOUNTER — Other Ambulatory Visit (HOSPITAL_COMMUNITY): Payer: Self-pay

## 2012-10-04 ENCOUNTER — Encounter (HOSPITAL_COMMUNITY): Payer: Self-pay | Attending: Oncology

## 2012-10-04 ENCOUNTER — Other Ambulatory Visit (HOSPITAL_COMMUNITY): Payer: Self-pay | Admitting: Oncology

## 2012-10-04 DIAGNOSIS — I82409 Acute embolism and thrombosis of unspecified deep veins of unspecified lower extremity: Secondary | ICD-10-CM | POA: Insufficient documentation

## 2012-10-04 LAB — PROTIME-INR: Prothrombin Time: 48.3 seconds — ABNORMAL HIGH (ref 11.6–15.2)

## 2012-10-04 NOTE — Progress Notes (Signed)
Labs drawn today for pt 

## 2012-10-25 ENCOUNTER — Encounter (HOSPITAL_BASED_OUTPATIENT_CLINIC_OR_DEPARTMENT_OTHER): Payer: Self-pay

## 2012-10-25 ENCOUNTER — Other Ambulatory Visit (HOSPITAL_COMMUNITY): Payer: Self-pay | Admitting: Oncology

## 2012-10-25 DIAGNOSIS — I82409 Acute embolism and thrombosis of unspecified deep veins of unspecified lower extremity: Secondary | ICD-10-CM

## 2012-10-25 MED ORDER — OXYCODONE HCL 5 MG PO CAPS: 10.0000 mg | ORAL_CAPSULE | ORAL | Status: DC | PRN

## 2012-10-25 NOTE — Progress Notes (Signed)
Labs drawn today for pt 

## 2012-11-22 ENCOUNTER — Encounter (HOSPITAL_COMMUNITY): Payer: Self-pay | Attending: Oncology

## 2012-11-22 DIAGNOSIS — I82409 Acute embolism and thrombosis of unspecified deep veins of unspecified lower extremity: Secondary | ICD-10-CM | POA: Insufficient documentation

## 2012-11-22 NOTE — Progress Notes (Signed)
Matthew Wise's reason for visit today are for labs as scheduled per MD orders.  Venipuncture performed with a 23 gauge butterfly needle to R Antecubital.  Matthew Wise tolerated venipuncture well and without incident; questions were answered and patient was discharged.

## 2012-11-29 ENCOUNTER — Other Ambulatory Visit (HOSPITAL_COMMUNITY): Payer: Self-pay | Admitting: Oncology

## 2012-11-29 ENCOUNTER — Encounter (HOSPITAL_COMMUNITY): Payer: Self-pay

## 2012-11-29 ENCOUNTER — Telehealth (HOSPITAL_COMMUNITY): Payer: Self-pay

## 2012-11-29 DIAGNOSIS — I82409 Acute embolism and thrombosis of unspecified deep veins of unspecified lower extremity: Secondary | ICD-10-CM

## 2012-11-29 DIAGNOSIS — S81802A Unspecified open wound, left lower leg, initial encounter: Secondary | ICD-10-CM

## 2012-11-29 LAB — PROTIME-INR: INR: 1.34 (ref 0.00–1.49)

## 2012-11-29 MED ORDER — OXYCODONE HCL 5 MG PO CAPS: 10.0000 mg | ORAL_CAPSULE | ORAL | Status: DC | PRN

## 2012-11-29 NOTE — Progress Notes (Signed)
Labs drawn today for pt 

## 2012-12-01 ENCOUNTER — Ambulatory Visit (HOSPITAL_COMMUNITY): Payer: Self-pay | Admitting: Physical Therapy

## 2012-12-05 ENCOUNTER — Ambulatory Visit (HOSPITAL_COMMUNITY)
Admission: RE | Admit: 2012-12-05 | Discharge: 2012-12-05 | Disposition: A | Discharge status: Home / Self Care | Payer: Self-pay | Source: Ambulatory Visit | Attending: Oncology | Admitting: Oncology

## 2012-12-05 ENCOUNTER — Other Ambulatory Visit (HOSPITAL_COMMUNITY): Payer: Self-pay

## 2012-12-05 DIAGNOSIS — IMO0001 Reserved for inherently not codable concepts without codable children: Secondary | ICD-10-CM | POA: Insufficient documentation

## 2012-12-05 DIAGNOSIS — Z7901 Long term (current) use of anticoagulants: Secondary | ICD-10-CM | POA: Insufficient documentation

## 2012-12-05 DIAGNOSIS — I825Z9 Chronic embolism and thrombosis of unspecified deep veins of unspecified distal lower extremity: Secondary | ICD-10-CM | POA: Insufficient documentation

## 2012-12-05 DIAGNOSIS — M25469 Effusion, unspecified knee: Secondary | ICD-10-CM | POA: Insufficient documentation

## 2012-12-05 DIAGNOSIS — L97809 Non-pressure chronic ulcer of other part of unspecified lower leg with unspecified severity: Secondary | ICD-10-CM | POA: Insufficient documentation

## 2012-12-05 DIAGNOSIS — I872 Venous insufficiency (chronic) (peripheral): Secondary | ICD-10-CM | POA: Insufficient documentation

## 2012-12-05 NOTE — Progress Notes (Signed)
Physical Therapy - Wound Therapy  Evaluation   Patient Details  Name: TERRON MERFELD MRN: [id number] Date of Birth: 18-May-1961  Today's Date: 12/05/2012 Time: 4401-0272 Time Calculation (min): 40 min  Visit#: 1  of 5   Re-eval: 01/04/13  Subjective Subjective Assessment Subjective: Mr. Uttech states that he has had his current wound for about the past six months.  The  patient has been cleansing and using antibiotic ointment on a daily basis but the wound continues to become deeper.  The  is currently being referred to therapy for a non-healing venous stasis wound. Patient and Family Stated Goals: wound to heal Date of Onset: 06/04/12 Prior Treatments: self dressing change  Pain Assessment Pain Assessment Pain Assessment: 0-10 Pain Score:   3 Pain Type: Chronic pain Pain Location: Leg Pain Orientation: Right;Posterior;Lateral Pain Descriptors: Aching Pain Onset: With Activity Patients Stated Pain Goal: 0 Pain Intervention(s): Other (Comment) (compression wrap)  Wound Therapy Wound 12/05/12 Other (Comment) Leg Right;Posterior;Lateral draining (Active)  Site / Wound Assessment Dusky 12/05/2012  2:08 PM  % Wound base Red or Granulating 20% 12/05/2012  2:08 PM  % Wound base Yellow 70% 12/05/2012  2:08 PM  % Wound base Black 10% 12/05/2012  2:08 PM  Peri-wound Assessment Edema 12/05/2012  2:08 PM  Wound Length (cm) 1.9 cm 12/05/2012  2:08 PM  Wound Width (cm) 1.5 cm 12/05/2012  2:08 PM  Wound Depth (cm) 1.1 cm 12/05/2012  2:08 PM  Margins Epibole (rolled edges) 12/05/2012  2:08 PM  Closure None 12/05/2012  2:08 PM  Drainage Amount Minimal 12/05/2012  2:08 PM  Drainage Description Serosanguineous 12/05/2012  2:08 PM  Non-staged Wound Description Full thickness 12/05/2012  2:08 PM  Treatment Cleansed;Debridement (Selective) 12/05/2012  2:08 PM  Dressing Type Compression wrap;Honey 12/05/2012  2:08 PM  Dressing Changed Changed 12/05/2012  2:08 PM  Dressing Status Clean 12/05/2012  2:08 PM   Selective  Debridement Selective Debridement - Location: entire wound bed  Selective Debridement - Tools Used: Forceps ,q-tip Selective Debridement - Tissue Removed: necrotic tissue/slough.   Physical Therapy Assessment and Plan Wound Therapy - Assess/Plan/Recommendations Wound Therapy - Clinical Statement: Pt has a venous stasis ulcer with increased swelling of B LE.  PT has been counseled about the importance of using compression hose but continues not to use them.  Pt will benefit from skilled PT to create a healing environment. Factors Delaying/Impairing Wound Healing: Infection - systemic/local;Multiple medical problems Hydrotherapy Plan: Debridement;Dressing change;Patient/family education Wound Therapy - Frequency:  (begin 2x a week for first week; if pt is not having any problem with the profore may decrease to one time a week) Wound Therapy - Current Recommendations: PT Wound Plan: see twice a week for the first week; if pt has no difficulty with profore may begin one time a week for four weeks.      Goals Wound Therapy Goals - Improve the function of patient's integumentary system by progressing the wound(s) through the phases of wound healing by: Decrease Necrotic Tissue to: 0% Decrease Necrotic Tissue - Progress: Goal set today Increase Granulation Tissue to: 100% Decrease Length/Width/Depth by (cm): 1x.7x.5 Decrease Length/Width/Depth - Progress: Goal set today Improve Drainage Characteristics: Min Improve Drainage Characteristics - Progress: Goal set today Patient/Family will be able to : verbalize the importance of wearing compression stockings Patient/Family Instruction Goal - Progress: Goal set today Goals/treatment plan/discharge plan were made with and agreed upon by patient/family: Yes Time For Goal Achievement:  (4 wk) Wound Therapy - Potential for  Goals: Good  Problem List Patient Active Problem List  Diagnosis  . Post-phlebitic syndrome  . IVC obstruction  . DVT (deep  venous thrombosis)  . H/O PE (pulmonary embolism)  . Congenital inferior vena cava interruption  . Leg wound, left  . IVC (inferior vena cava obstruction)    GP    RUSSELL,CINDY 12/05/2012, 2:23 PM

## 2012-12-06 ENCOUNTER — Encounter (HOSPITAL_COMMUNITY): Payer: Self-pay

## 2012-12-06 ENCOUNTER — Encounter (HOSPITAL_COMMUNITY): Payer: Self-pay | Admitting: Oncology

## 2012-12-06 ENCOUNTER — Encounter (HOSPITAL_COMMUNITY): Payer: Self-pay | Attending: Oncology | Admitting: Oncology

## 2012-12-06 VITALS — BP 131/86 | HR 67 | Resp 18 | Wt 292.1 lb

## 2012-12-06 DIAGNOSIS — Z86711 Personal history of pulmonary embolism: Secondary | ICD-10-CM

## 2012-12-06 DIAGNOSIS — D6859 Other primary thrombophilia: Secondary | ICD-10-CM

## 2012-12-06 DIAGNOSIS — I82409 Acute embolism and thrombosis of unspecified deep veins of unspecified lower extremity: Secondary | ICD-10-CM

## 2012-12-06 DIAGNOSIS — Z86718 Personal history of other venous thrombosis and embolism: Secondary | ICD-10-CM

## 2012-12-06 DIAGNOSIS — F17200 Nicotine dependence, unspecified, uncomplicated: Secondary | ICD-10-CM | POA: Insufficient documentation

## 2012-12-06 DIAGNOSIS — Z7901 Long term (current) use of anticoagulants: Secondary | ICD-10-CM

## 2012-12-06 LAB — PROTIME-INR
INR: 2.48 — ABNORMAL HIGH (ref 0.00–1.49)
Prothrombin Time: 25.7 seconds — ABNORMAL HIGH (ref 11.6–15.2)

## 2012-12-06 MED ORDER — NICOTINE POLACRILEX 2 MG MT GUM: 2.0000 mg | CHEWING_GUM | OROMUCOSAL | Status: DC | PRN

## 2012-12-06 NOTE — Patient Instructions (Addendum)
[facility] Discharge Instructions  RECOMMENDATIONS MADE BY THE CONSULTANT AND ANY TEST RESULTS WILL BE SENT TO YOUR REFERRING PHYSICIAN.  EXAM FINDINGS BY THE PHYSICIAN TODAY AND SIGNS OR SYMPTOMS TO REPORT TO CLINIC OR PRIMARY PHYSICIAN: Exam and discussion by MD.   MEDICATIONS PRESCRIBED:  Nicorette gum:  Can chew 1 piece 6 - 8 times daily.  Use at time you would normally smoke.  Don't smoke if you use the gum. Coumadin - go back to 12.5 mg alternating with 15 mg each day  INSTRUCTIONS GIVEN AND DISCUSSED: Report swelling of extremities or other problems.  SPECIAL INSTRUCTIONS/FOLLOW-UP: Blood work next Tuesday and to be seen in follow-up in 6 months.  Thank you for choosing [facility] to provide your oncology and hematology care.  To afford each patient quality time with our providers, please arrive at least 15 minutes before your scheduled appointment time.  With your help, our goal is to use those 15 minutes to complete the necessary work-up to ensure our physicians have the information they need to help with your evaluation and healthcare recommendations.    Effective January 1st, 2014, we ask that you re-schedule your appointment with our physicians should you arrive 10 or more minutes late for your appointment.  We strive to give you quality time with our providers, and arriving late affects you and other patients whose appointments are after yours.    Again, thank you for choosing [facility].  Our hope is that these requests will decrease the amount of time that you wait before being seen by our physicians.       _____________________________________________________________  I acknowledge that I have been informed and understand all the instructions given to me and received a copy. I do not have anymore questions at this time but understand that I may call the Cancer Center at [facility] at [phone] during business hours should  I have any further questions or need assistance in obtaining follow-up care.

## 2012-12-06 NOTE — Progress Notes (Signed)
Labs drawn today for pt 

## 2012-12-08 ENCOUNTER — Ambulatory Visit (HOSPITAL_COMMUNITY)
Admission: RE | Admit: 2012-12-08 | Discharge: 2012-12-08 | Disposition: A | Discharge status: Home / Self Care | Payer: Self-pay | Source: Ambulatory Visit | Attending: Oncology | Admitting: Oncology

## 2012-12-08 NOTE — Progress Notes (Addendum)
Physical Therapy - Wound Therapy  Treatment   Patient Details  Name: NASEER HEARN MRN: [id number] Date of Birth: 02-03-1961  Today's Date: 12/08/2012 Time: 5784-6962 Time Calculation (min): 35 min Charge: debridement less than 20 cm Visit#: 2  of 5   Re-eval: 01/04/13  Subjective Subjective Assessment Subjective: Pt states the profore wrap began to slide down yesterday so he took it off. Patient and Family Stated Goals: wound to heal  Pain Assessment Pain Assessment Pain Assessment: No/denies pain  Wound Therapy Wound 12/05/12 Other (Comment) Leg Right;Posterior;Lateral draining (Active)  Site / Wound Assessment Dusky 12/08/2012  9:28 AM  % Wound base Red or Granulating 30% 12/08/2012  9:28 AM  % Wound base Yellow 70% 12/08/2012  9:28 AM  % Wound base Black 0% 12/08/2012  9:28 AM  Peri-wound Assessment Edema 12/08/2012  9:28 AM  Wound Length (cm) 1.9 cm 12/05/2012  2:08 PM  Wound Width (cm) 1.5 cm 12/05/2012  2:08 PM  Wound Depth (cm) 1.1 cm 12/05/2012  2:08 PM  Undermining (cm) 12:00 on clock undermining  .3cm; 3:00 on clock undermining .6 cm;  9:00 on clock undermining .2 cm 12/08/2012  9:28 AM  Margins Unattacted edges (unapproximated) 12/08/2012  9:28 AM  Closure None 12/08/2012  9:28 AM  Drainage Amount Minimal 12/08/2012  9:28 AM  Drainage Description Serous;No odor 12/08/2012  9:28 AM  Non-staged Wound Description Full thickness 12/08/2012  9:28 AM  Treatment Cleansed;Debridement (Selective);Other (Comment) 12/08/2012  9:28 AM  Dressing Type Compression wrap;Honey 12/08/2012  9:28 AM  Dressing Changed New 12/08/2012  9:28 AM  Dressing Status Clean;Intact 12/08/2012  9:28 AM   Selective Debridement Selective Debridement - Location: entire wound bed Selective Debridement - Tools Used: Forceps Selective Debridement - Tissue Removed: slough; dead skin   Physical Therapy Assessment and Plan Wound Therapy - Assess/Plan/Recommendations Wound Therapy - Clinical Statement: Pt LE swelling has decreased  but still edematous.  Pt wound has decreased slough but continues to be dusky; responded welll with vigourous rubbing with q-tip w/ Caraklense and debridement. Wound Plan: begin once a week for the next four weeks.       Problem List Patient Active Problem List  Diagnosis  . Post-phlebitic syndrome  . IVC obstruction  . DVT (deep venous thrombosis)  . H/O PE (pulmonary embolism)  . Congenital inferior vena cava interruption  . Leg wound, left  . IVC (inferior vena cava obstruction)    GP    RUSSELL,CINDY 12/08/2012, 9:36 AM

## 2012-12-12 NOTE — Progress Notes (Signed)
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 He certainly may be a candidate for the new oral factor X a inhibitors. He did see Dr.Knovich at [facility] who also found in sickle cell trait which also makes him more risk for DVTs. He did not have cardiolipin antibodies, nor beta-2 glycoprotein antibodies. She also felt that with this chronically, swollen and indurated legs that he is not a good candidate for ongoing work.  I will discuss with him the xarelto in the future but she absolutely agreed with lifelong anticoagulation in one form or another. If he continues to have difficulties staying anticoagulated with Coumadin we will discuss xarelto with him again in the future. Dr.Knovich and I both feel he is a candidate for permanent disability appear

## 2012-12-13 ENCOUNTER — Encounter (HOSPITAL_BASED_OUTPATIENT_CLINIC_OR_DEPARTMENT_OTHER): Payer: Self-pay

## 2012-12-13 DIAGNOSIS — I82409 Acute embolism and thrombosis of unspecified deep veins of unspecified lower extremity: Secondary | ICD-10-CM

## 2012-12-13 LAB — PROTIME-INR
INR: 2.37 — ABNORMAL HIGH (ref 0.00–1.49)
Prothrombin Time: 24.8 seconds — ABNORMAL HIGH (ref 11.6–15.2)

## 2012-12-13 NOTE — Progress Notes (Signed)
Labs drawn today for pt 

## 2012-12-14 ENCOUNTER — Ambulatory Visit (HOSPITAL_COMMUNITY)
Admission: RE | Admit: 2012-12-14 | Discharge: 2012-12-14 | Disposition: A | Discharge status: Home / Self Care | Payer: Self-pay | Source: Ambulatory Visit | Attending: Oncology | Admitting: Oncology

## 2012-12-14 NOTE — Progress Notes (Signed)
Physical Therapy - Wound Therapy  Treatment   Patient Details  Name: Matthew Wise MRN: [id number] Date of Birth: Oct 17, 1961  Today's Date: 12/14/2012 Time: 4098-1191 Time Calculation (min): 30 min  Visit#: 3  of 5   Re-eval: 01/04/13 Charges: Selective debridement (= or < 20 cm) Profore x 1    Subjective Subjective Assessment Subjective: Pt states the profore wrap stayed on 4 days before it slid and he had to take it off. He states that he kept a bandage on his leg for compression.   Pain Assessment Pain Assessment Pain Assessment: No/denies pain  Wound Therapy Wound 12/05/12 Other (Comment) Leg Right;Posterior;Lateral draining (Active)  Site / Wound Assessment Dusky 12/14/2012 10:59 AM  % Wound base Red or Granulating 50% 12/14/2012 10:59 AM  % Wound base Yellow 50% 12/14/2012 10:59 AM  % Wound base Black 0% 12/14/2012 10:59 AM  Peri-wound Assessment Edema 12/14/2012 10:59 AM  Wound Length (cm) 1.9 cm 12/05/2012  2:08 PM  Wound Width (cm) 1.5 cm 12/05/2012  2:08 PM  Wound Depth (cm) 1.1 cm 12/05/2012  2:08 PM  Undermining (cm) 12:00 on clock undermining  .3cm; 3:00 on clock undermining .6 cm;  9:00 on clock undermining .2 cm 12/08/2012  9:28 AM  Margins Unattacted edges (unapproximated) 12/14/2012 10:59 AM  Closure None 12/14/2012 10:59 AM  Drainage Amount Minimal 12/14/2012 10:59 AM  Drainage Description Serous;No odor 12/14/2012 10:59 AM  Non-staged Wound Description Full thickness 12/14/2012 10:59 AM  Treatment Cleansed;Debridement (Selective) 12/14/2012 10:59 AM  Dressing Type Compression wrap;Honey 12/14/2012 10:59 AM  Dressing Changed Changed 12/14/2012 10:59 AM  Dressing Status Clean;Intact 12/14/2012 10:59 AM   Selective Debridement Selective Debridement - Location: entire wound bed Selective Debridement - Tools Used: Forceps Selective Debridement - Tissue Removed: slough; dead skin   Physical Therapy Assessment and Plan Wound Therapy - Assess/Plan/Recommendations Wound  Therapy - Clinical Statement: Pt selling is slightly decreased. Wound appears more healthy and granulated. Wound is without signs or sx of infection. Pt tolerates tx well. Wound Plan: Continue wound care per PT POC.    Problem List Patient Active Problem List  Diagnosis  . Post-phlebitic syndrome  . IVC obstruction  . DVT (deep venous thrombosis)  . H/O PE (pulmonary embolism)  . Congenital inferior vena cava interruption  . Leg wound, left  . IVC (inferior vena cava obstruction)    Seth Bake, PTA 12/14/2012, 11:12 AM

## 2012-12-21 ENCOUNTER — Ambulatory Visit (HOSPITAL_COMMUNITY)
Admission: RE | Admit: 2012-12-21 | Discharge: 2012-12-21 | Disposition: A | Discharge status: Home / Self Care | Payer: Self-pay | Source: Ambulatory Visit | Attending: Oncology | Admitting: Oncology

## 2012-12-21 NOTE — Progress Notes (Signed)
Physical Therapy - Wound Therapy  Treatment   Patient Details  Name: Matthew Wise MRN: [id number] Date of Birth: 30-Sep-1961  Today's Date: 12/21/2012 Time: 1478-2956 Time Calculation (min): 27 min Charges: Selective debridement (= or < 20 cm) Profore x 1  Visit#: 4  of 5   Re-eval: 01/04/13  Subjective Subjective Assessment Subjective: Pt states that profore wrap stayed on 4 days before beginning to slide. Pt states that he took the whole dressing off and recovered it.  Pain Assessment Pain Assessment Pain Assessment: No/denies pain  Wound Therapy Wound 12/05/12 Other (Comment) Leg Right;Posterior;Lateral draining (Active)  Site / Wound Assessment Granulation tissue;Red;Yellow 12/21/2012 11:06 AM  % Wound base Red or Granulating 60% 12/21/2012 11:06 AM  % Wound base Yellow 40% 12/21/2012 11:06 AM  % Wound base Black 0% 12/21/2012 11:06 AM  Peri-wound Assessment Edema 12/21/2012 11:06 AM  Wound Length (cm) 1.9 cm 12/05/2012  2:08 PM  Wound Width (cm) 1.5 cm 12/05/2012  2:08 PM  Wound Depth (cm) 1.1 cm 12/05/2012  2:08 PM  Undermining (cm) 12:00 on clock undermining  .3cm; 3:00 on clock undermining .6 cm;  9:00 on clock undermining .2 cm 12/08/2012  9:28 AM  Margins Unattacted edges (unapproximated) 12/21/2012 11:06 AM  Closure None 12/21/2012 11:06 AM  Drainage Amount Minimal 12/21/2012 11:06 AM  Drainage Description Serous;No odor 12/21/2012 11:06 AM  Non-staged Wound Description Full thickness 12/21/2012 11:06 AM  Treatment Cleansed;Debridement (Selective) 12/21/2012 11:06 AM  Dressing Type Compression wrap;Honey 12/21/2012 11:06 AM  Dressing Changed Changed 12/21/2012 11:06 AM  Dressing Status Clean;Intact 12/21/2012 11:06 AM   Selective Debridement Selective Debridement - Location: entire wound bed Selective Debridement - Tools Used: Forceps Selective Debridement - Tissue Removed: slough; dead skin   Physical Therapy Assessment and Plan Wound Therapy -  Assess/Plan/Recommendations Wound Therapy - Clinical Statement: Wound continues to appear more healthy and granulated. Wound edges are dry. Vaseline applied to wound perimeter to improve skin integrity. Wound is without signs or sx of infection. Pt tolerates tx well. Wound Plan: Reassess next session.  Problem List Patient Active Problem List  Diagnosis  . Post-phlebitic syndrome  . IVC obstruction  . DVT (deep venous thrombosis)  . H/O PE (pulmonary embolism)  . Congenital inferior vena cava interruption  . Leg wound, left  . IVC (inferior vena cava obstruction)    Seth Bake, PTA 12/21/2012, 11:17 AM

## 2012-12-27 ENCOUNTER — Other Ambulatory Visit (HOSPITAL_COMMUNITY): Payer: Self-pay

## 2012-12-28 ENCOUNTER — Inpatient Hospital Stay (HOSPITAL_COMMUNITY): Admission: RE | Admit: 2012-12-28 | Payer: Self-pay | Source: Ambulatory Visit

## 2012-12-28 ENCOUNTER — Ambulatory Visit (HOSPITAL_COMMUNITY)
Admission: RE | Admit: 2012-12-28 | Discharge: 2012-12-28 | Disposition: A | Discharge status: Home / Self Care | Payer: Self-pay | Source: Ambulatory Visit | Attending: Oncology | Admitting: Oncology

## 2012-12-28 NOTE — Progress Notes (Signed)
Physical Therapy - Wound Therapy Re-evaluation  Re-evaluation   Patient Details  Name: Matthew Wise MRN: [id number] Date of Birth: Jul 06, 1961  Today's Date: 12/28/2012 Time: 4696-2952 Time Calculation (min): 37 min  Visit#: 5  of 9   Re-eval: 01/25/13 Charges:  Deb <20cm, profore dressing  Subjective Subjective Assessment Subjective: Pt. states his bandage always falls down after the first day or so.  Pt. had an ACE bandage on R LE from ankle to knee.  Pt. wearing compression hose on L LE.  Pain Assessment Pain Assessment Pain Assessment: No/denies pain  Wound Therapy Wound 12/05/12 Other (Comment) Leg Right;Posterior;Lateral draining (Active)  Site / Wound Assessment Dusky;Granulation tissue;Yellow 12/28/2012  1:54 PM  % Wound base Red or Granulating 60% 12/28/2012  1:54 PM  % Wound base Yellow 40% 12/28/2012  1:54 PM  % Wound base Black 0% 12/28/2012  1:54 PM  % Wound base Other (Comment) 0% 12/28/2012  1:54 PM  Peri-wound Assessment Edema 12/28/2012  1:54 PM  Wound Length (cm) 2 cm  (was 1.9 cm) 12/28/2012  1:54 PM  Wound Width (cm) 2 cm  (was 1.5 cm) 12/28/2012  1:54 PM  Wound Depth (cm) 0.4 cm (was 1.1cm) 12/28/2012  1:54 PM  Tunneling (cm) none 12/28/2012  1:54 PM  Undermining (cm) none 12/28/2012  1:54 PM  Margins Attached edges (approximated) 12/28/2012  1:54 PM  Closure None 12/21/2012 11:06 AM  Drainage Amount Moderate 12/28/2012  1:54 PM  Drainage Description Serous 12/28/2012  1:54 PM  Non-staged Wound Description Not applicable 12/28/2012  1:54 PM  Treatment Cleansed;Debridement (Selective) 12/28/2012  1:54 PM  Dressing Type Alginate;Compression wrap;Foam;Honey 12/28/2012  1:54 PM  Dressing Changed Changed 12/28/2012  1:54 PM  Dressing Status Clean;Dry;Intact 12/28/2012  1:54 PM   Selective Debridement Selective Debridement - Location: entire wound bed Selective Debridement - Tools Used: Forceps Selective Debridement - Tissue Removed: slough, dead tissue   Physical  Therapy Assessment and Plan Wound Therapy - Assess/Plan/Recommendations Wound Therapy - Clinical Statement: Wound measured today; increased margins, however decreased depth.  Granulation of 60%.  Honey Alginate applied to wound.  Added foam to increase compliance with keeping dressing intact (pt. will remove when begins to slide down).  Pt. educated on importance of compression for healing his wound and continued use of compression hose to keep new wounds from forming. Wound Plan: Continue ; check effectiveness of using foam with PROFORE next visit.   Continue 1X week for 4 more weeks.      Goals Improve the function of patient's integumentary system by progressing the wound(s) through the phases of wound healing by: 1.  Decrease Necrotic Tissue to: 0%  Progress: Progressing toward goal 2.  Increase Granulation Tissue to: 100%  Progress: Progressing toward goal 3.  Decrease Length/Width/Depth by (cm): 1x.7x.5  Progress: Progressing toward goal 4.  Improve Drainage Characteristics to Minimal  Progress: Met 5.  Patient will be able to verbalize the importance of wearing compression stockings  Progress: Progressing toward goal   Problem List Patient Active Problem List  Diagnosis  . Post-phlebitic syndrome  . IVC obstruction  . DVT (deep venous thrombosis)  . H/O PE (pulmonary embolism)  . Congenital inferior vena cava interruption  . Leg wound, left  . IVC (inferior vena cava obstruction)     Matthew Wise, PTA/CLT 12/28/2012, 2:04 PM

## 2013-01-04 ENCOUNTER — Encounter (HOSPITAL_COMMUNITY): Payer: Self-pay | Attending: Oncology

## 2013-01-04 ENCOUNTER — Ambulatory Visit (HOSPITAL_COMMUNITY)
Admission: RE | Admit: 2013-01-04 | Discharge: 2013-01-04 | Disposition: A | Discharge status: Home / Self Care | Payer: Self-pay | Source: Ambulatory Visit | Attending: Oncology | Admitting: Oncology

## 2013-01-04 DIAGNOSIS — I872 Venous insufficiency (chronic) (peripheral): Secondary | ICD-10-CM | POA: Insufficient documentation

## 2013-01-04 DIAGNOSIS — M25469 Effusion, unspecified knee: Secondary | ICD-10-CM | POA: Insufficient documentation

## 2013-01-04 DIAGNOSIS — Z7901 Long term (current) use of anticoagulants: Secondary | ICD-10-CM | POA: Insufficient documentation

## 2013-01-04 DIAGNOSIS — L97809 Non-pressure chronic ulcer of other part of unspecified lower leg with unspecified severity: Secondary | ICD-10-CM | POA: Insufficient documentation

## 2013-01-04 DIAGNOSIS — IMO0001 Reserved for inherently not codable concepts without codable children: Secondary | ICD-10-CM | POA: Insufficient documentation

## 2013-01-04 DIAGNOSIS — I82409 Acute embolism and thrombosis of unspecified deep veins of unspecified lower extremity: Secondary | ICD-10-CM | POA: Insufficient documentation

## 2013-01-04 DIAGNOSIS — I825Z9 Chronic embolism and thrombosis of unspecified deep veins of unspecified distal lower extremity: Secondary | ICD-10-CM | POA: Insufficient documentation

## 2013-01-04 LAB — PROTIME-INR: Prothrombin Time: 35.3 seconds — ABNORMAL HIGH (ref 11.6–15.2)

## 2013-01-04 NOTE — Progress Notes (Signed)
Physical Therapy - Wound Therapy  Treatment   Patient Details  Name: Matthew Wise MRN: [id number] Date of Birth: September 04, 1961  Today's Date: 01/04/2013 Time: 1020-1045 Time Calculation (min): 25 min  Visit#: 6  of 9   Re-eval: 01/25/13 Charges: Selective debridement (= or < 20 cm)  Profore Kit x 1   Subjective Subjective Assessment Subjective: Pt states that the foam helped keep his bandage up longer but it still slid down after about 4 days. Pt accidently left foam at home.  Pain Assessment Pain Assessment Pain Assessment: No/denies pain  Wound Therapy Wound 12/05/12 Other (Comment) Leg Right;Posterior;Lateral draining (Active)  Site / Wound Assessment Dusky;Granulation tissue;Yellow 01/04/2013 11:44 AM  % Wound base Red or Granulating 70% 01/04/2013 11:44 AM  % Wound base Yellow 30% 01/04/2013 11:44 AM  % Wound base Black 0% 01/04/2013 11:44 AM  % Wound base Other (Comment) 0% 01/04/2013 11:44 AM  Peri-wound Assessment Edema 01/04/2013 11:44 AM  Wound Length (cm) 2 cm 12/28/2012  1:54 PM  Wound Width (cm) 2 cm 12/28/2012  1:54 PM  Wound Depth (cm) 0.4 cm 12/28/2012  1:54 PM  Tunneling (cm) none 12/28/2012  1:54 PM  Undermining (cm) none 12/28/2012  1:54 PM  Margins Attached edges (approximated) 01/04/2013 11:44 AM  Closure None 12/21/2012 11:06 AM  Drainage Amount Moderate 12/28/2012  1:54 PM  Drainage Description Serous 12/28/2012  1:54 PM  Non-staged Wound Description Not applicable 01/04/2013 11:44 AM  Treatment Cleansed;Debridement (Selective) 01/04/2013 11:44 AM  Dressing Type Alginate;Compression wrap;Foam;Honey 01/04/2013 11:44 AM  Dressing Changed Changed 01/04/2013 11:44 AM  Dressing Status Clean;Dry;Intact 01/04/2013 11:44 AM   Selective Debridement Selective Debridement - Location: entire wound bed Selective Debridement - Tools Used: Forceps Selective Debridement - Tissue Removed: slough, dead tissue   Physical Therapy Assessment and Plan Wound Therapy -  Assess/Plan/Recommendations Wound Therapy - Clinical Statement: Wound continues to present with improved granulation. Wound appears to be approximating. LE is without signs or sx of infection. Pt tolerates debridement well. Wound Plan: Continue wound care and profore per PT POC.  Problem List Patient Active Problem List  Diagnosis  . Post-phlebitic syndrome  . IVC obstruction  . DVT (deep venous thrombosis)  . H/O PE (pulmonary embolism)  . Congenital inferior vena cava interruption  . Leg wound, left  . IVC (inferior vena cava obstruction)    Seth Bake, PTA 01/04/2013, 11:50 AM

## 2013-01-04 NOTE — Progress Notes (Signed)
Labs drawn today for pt 

## 2013-01-10 ENCOUNTER — Inpatient Hospital Stay (HOSPITAL_COMMUNITY): Admission: RE | Admit: 2013-01-10 | Payer: Self-pay | Source: Ambulatory Visit | Admitting: *Deleted

## 2013-01-12 ENCOUNTER — Other Ambulatory Visit (HOSPITAL_COMMUNITY): Payer: Self-pay

## 2013-01-13 ENCOUNTER — Other Ambulatory Visit (HOSPITAL_COMMUNITY): Payer: Self-pay | Admitting: Oncology

## 2013-01-13 ENCOUNTER — Encounter (HOSPITAL_BASED_OUTPATIENT_CLINIC_OR_DEPARTMENT_OTHER): Payer: Self-pay

## 2013-01-13 DIAGNOSIS — I82409 Acute embolism and thrombosis of unspecified deep veins of unspecified lower extremity: Secondary | ICD-10-CM

## 2013-01-13 LAB — PROTIME-INR: INR: 3.6 — ABNORMAL HIGH (ref 0.00–1.49)

## 2013-01-13 MED ORDER — OXYCODONE HCL 5 MG PO CAPS: 10.0000 mg | ORAL_CAPSULE | ORAL | Status: DC | PRN

## 2013-01-13 NOTE — Progress Notes (Signed)
Labs drawn today for pt 

## 2013-01-18 ENCOUNTER — Ambulatory Visit (HOSPITAL_COMMUNITY)
Admission: RE | Admit: 2013-01-18 | Discharge: 2013-01-18 | Disposition: A | Discharge status: Home / Self Care | Payer: Self-pay | Source: Ambulatory Visit | Attending: Oncology | Admitting: Oncology

## 2013-01-18 NOTE — Progress Notes (Signed)
Physical Therapy - Wound Therapy  Treatment   Patient Details  Name: Matthew Wise MRN: [id number] Date of Birth: 1961-10-30  Today's Date: 01/18/2013 Time: 1020-1045 Time Calculation (min): 25 min  Visit#: 7 of 9  Re-eval: 01/25/13 Charges: Selective debridement (= or < 20 cm)   Subjective Subjective Assessment Subjective: Pt states that profore wrap does not stay up longer than 4 days.  Pain Assessment Pain Assessment Pain Assessment: No/denies pain  Wound Therapy Wound 12/05/12 Other (Comment) Leg Right;Posterior;Lateral draining (Active)  Site / Wound Assessment Dusky;Granulation tissue;Yellow 01/18/2013 11:08 AM  % Wound base Red or Granulating 80% 01/18/2013 11:08 AM  % Wound base Yellow 20% 01/18/2013 11:08 AM  % Wound base Black 0% 01/18/2013 11:08 AM  % Wound base Other (Comment) 0% 01/18/2013 11:08 AM  Peri-wound Assessment Edema 01/18/2013 11:08 AM  Wound Length (cm) 2 cm 12/28/2012  1:54 PM  Wound Width (cm) 2 cm 12/28/2012  1:54 PM  Wound Depth (cm) 0.4 cm 12/28/2012  1:54 PM  Tunneling (cm) none 12/28/2012  1:54 PM  Undermining (cm) none 12/28/2012  1:54 PM  Margins Attached edges (approximated) 01/04/2013 11:44 AM  Closure None 12/21/2012 11:06 AM  Drainage Amount Moderate 12/28/2012  1:54 PM  Drainage Description Serous 12/28/2012  1:54 PM  Non-staged Wound Description Not applicable 01/18/2013 11:08 AM  Treatment Cleansed;Debridement (Selective) 01/18/2013 11:08 AM  Dressing Type Alginate;Other (Comment);Gauze (Comment);Tape dressing 01/18/2013 11:08 AM  Dressing Changed Changed 01/18/2013 11:08 AM  Dressing Status Clean;Dry;Intact 01/18/2013 11:08 AM   Selective Debridement Selective Debridement - Location: entire wound bed Selective Debridement - Tools Used: Forceps Selective Debridement - Tissue Removed: slough, dead tissue   Physical Therapy Assessment and Plan Wound Therapy - Assess/Plan/Recommendations Wound Therapy - Clinical Statement: Wounds continues to  presents with improved granulation and appears to be approximating slowly. Able to remove moderate amount of necrotic tissue. Dressing was anchored with medipore tape rather than wrapping as wrap was sliding after 4 days. Wound Plan: Continue wound care per PT POC.     Problem List Patient Active Problem List  Diagnosis  . Post-phlebitic syndrome  . IVC obstruction  . DVT (deep venous thrombosis)  . H/O PE (pulmonary embolism)  . Congenital inferior vena cava interruption  . Leg wound, left  . IVC (inferior vena cava obstruction)    Seth Bake, PTA  01/18/2013, 11:49 AM

## 2013-01-23 ENCOUNTER — Encounter (HOSPITAL_BASED_OUTPATIENT_CLINIC_OR_DEPARTMENT_OTHER): Payer: Self-pay

## 2013-01-23 DIAGNOSIS — I82409 Acute embolism and thrombosis of unspecified deep veins of unspecified lower extremity: Secondary | ICD-10-CM

## 2013-01-23 LAB — PROTIME-INR
INR: 3.4 — ABNORMAL HIGH (ref 0.00–1.49)
Prothrombin Time: 32.4 seconds — ABNORMAL HIGH (ref 11.6–15.2)

## 2013-01-23 NOTE — Progress Notes (Signed)
Labs drawn today for pt 

## 2013-01-25 ENCOUNTER — Ambulatory Visit (HOSPITAL_COMMUNITY)
Admission: RE | Admit: 2013-01-25 | Discharge: 2013-01-25 | Disposition: A | Discharge status: Home / Self Care | Payer: Self-pay | Source: Ambulatory Visit | Attending: Oncology | Admitting: Oncology

## 2013-01-25 NOTE — Progress Notes (Signed)
Physical Therapy - Wound Therapy  Treatment   Patient Details  Name: BOBBI KOZAKIEWICZ MRN: [id number] Date of Birth: February 19, 1961  Today's Date: 01/25/2013 Time: 1020-1035 Time Calculation (min): 15 min  Visit#: 8 of 9  Re-eval: 01/25/13 Charges: Selective debridement (= or < 20 cm)   Subjective Subjective Assessment Subjective: Pt states that he changed his dressing once since last session.  Pain Assessment Pain Assessment Pain Assessment: No/denies pain  Wound Therapy Wound 12/05/12 Other (Comment) Leg Right;Posterior;Lateral draining (Active)  Site / Wound Assessment Dusky;Granulation tissue;Yellow 01/25/2013 12:06 PM  % Wound base Red or Granulating 90% 01/25/2013 12:06 PM  % Wound base Yellow 10% 01/25/2013 12:06 PM  % Wound base Black 0% 01/25/2013 12:06 PM  % Wound base Other (Comment) 0% 01/25/2013 12:06 PM  Peri-wound Assessment Edema 01/25/2013 12:06 PM  Wound Length (cm) 1.8 cm 01/25/2013 12:06 PM  Wound Width (cm) 2 cm 01/25/2013 12:06 PM  Wound Depth (cm) 0.3 cm 01/25/2013 12:06 PM  Tunneling (cm) none 12/28/2012  1:54 PM  Undermining (cm) none 12/28/2012  1:54 PM  Margins Attached edges (approximated) 01/04/2013 11:44 AM  Closure None 12/21/2012 11:06 AM  Drainage Amount Moderate 12/28/2012  1:54 PM  Drainage Description Serous 12/28/2012  1:54 PM  Non-staged Wound Description Not applicable 01/25/2013 12:06 PM  Treatment Cleansed;Debridement (Selective) 01/25/2013 12:06 PM  Dressing Type Alginate;Other (Comment);Gauze (Comment);Tape dressing 01/25/2013 12:06 PM  Dressing Changed Changed 01/18/2013 11:08 AM  Dressing Status Clean;Dry;Intact 01/25/2013 12:06 PM   Selective Debridement Selective Debridement - Location: entire wound bed Selective Debridement - Tools Used: Forceps Selective Debridement - Tissue Removed: slough, dead tissue   Physical Therapy Assessment and Plan Wound Therapy - Assess/Plan/Recommendations Wound Therapy - Clinical Statement: Therex has been  little change in wound size since last measurement but wound appears much healthier. Granulation has increased. Pt states that he wears compression garments all day every day until he goes to bed. Wound Plan: Recommend to continue 2 x week  x 4 weeks.  Problem List Patient Active Problem List  Diagnosis  . Post-phlebitic syndrome  . IVC obstruction  . DVT (deep venous thrombosis)  . H/O PE (pulmonary embolism)  . Congenital inferior vena cava interruption  . Leg wound, left  . IVC (inferior vena cava obstruction)    Seth Bake, PTA  01/25/2013, 12:10 PM

## 2013-01-30 ENCOUNTER — Other Ambulatory Visit (HOSPITAL_COMMUNITY): Payer: Self-pay

## 2013-02-01 ENCOUNTER — Inpatient Hospital Stay (HOSPITAL_COMMUNITY): Admission: RE | Admit: 2013-02-01 | Payer: Self-pay | Source: Ambulatory Visit | Admitting: Physical Therapy

## 2013-02-14 ENCOUNTER — Encounter (HOSPITAL_COMMUNITY): Payer: Self-pay | Attending: Oncology

## 2013-02-14 DIAGNOSIS — I82409 Acute embolism and thrombosis of unspecified deep veins of unspecified lower extremity: Secondary | ICD-10-CM | POA: Insufficient documentation

## 2013-02-14 NOTE — Progress Notes (Signed)
Labs drawn today for pt 

## 2013-02-15 ENCOUNTER — Ambulatory Visit (HOSPITAL_COMMUNITY)
Admission: RE | Admit: 2013-02-15 | Discharge: 2013-02-15 | Disposition: A | Discharge status: Home / Self Care | Payer: Self-pay | Source: Ambulatory Visit | Attending: Oncology | Admitting: Oncology

## 2013-02-15 DIAGNOSIS — IMO0001 Reserved for inherently not codable concepts without codable children: Secondary | ICD-10-CM | POA: Insufficient documentation

## 2013-02-15 DIAGNOSIS — I825Z9 Chronic embolism and thrombosis of unspecified deep veins of unspecified distal lower extremity: Secondary | ICD-10-CM | POA: Insufficient documentation

## 2013-02-15 DIAGNOSIS — L97809 Non-pressure chronic ulcer of other part of unspecified lower leg with unspecified severity: Secondary | ICD-10-CM | POA: Insufficient documentation

## 2013-02-15 DIAGNOSIS — I872 Venous insufficiency (chronic) (peripheral): Secondary | ICD-10-CM | POA: Insufficient documentation

## 2013-02-15 DIAGNOSIS — M25469 Effusion, unspecified knee: Secondary | ICD-10-CM | POA: Insufficient documentation

## 2013-02-15 DIAGNOSIS — Z7901 Long term (current) use of anticoagulants: Secondary | ICD-10-CM | POA: Insufficient documentation

## 2013-02-15 NOTE — Progress Notes (Signed)
Physical Therapy - Wound Therapy  Treatment   Patient Details  Name: ELLWOOD STEIDLE MRN: [id number] Date of Birth: 1961-04-24  Today's Date: 02/15/2013 Time: 1520-1610 Time Calculation (min): 50 min  Visit#: 9 of 12  Re-eval: 02/22/13 Charges: Selective debridement (> 20 cm)    Subjective Subjective Assessment Subjective: Pt states that he has 2 new areas on LLE that are very painful  Pain Assessment Pain Assessment Pain Score: 10-Worst pain ever Pain Location: Leg Pain Orientation: Left;Anterior;Medial  Wound Therapy Wound 12/05/12 Other (Comment) Leg Right;Posterior;Lateral draining (Active)  Site / Wound Assessment Dusky;Granulation tissue;Yellow 02/15/2013  5:00 PM  % Wound base Red or Granulating 70% 02/15/2013  5:00 PM  % Wound base Yellow 30% 02/15/2013  5:00 PM  % Wound base Black 0% 02/15/2013  5:00 PM  % Wound base Other (Comment) 0% 02/15/2013  5:00 PM  Peri-wound Assessment Edema 01/25/2013 12:06 PM  Wound Length (cm) 1.8 cm 01/25/2013 12:06 PM  Wound Width (cm) 2 cm 01/25/2013 12:06 PM  Wound Depth (cm) 0.3 cm 01/25/2013 12:06 PM  Tunneling (cm) none 12/28/2012  1:54 PM  Undermining (cm) none 12/28/2012  1:54 PM  Margins Attached edges (approximated) 01/04/2013 11:44 AM  Closure None 12/21/2012 11:06 AM  Drainage Amount Moderate 12/28/2012  1:54 PM  Drainage Description Serous 12/28/2012  1:54 PM  Non-staged Wound Description Not applicable 02/15/2013  5:00 PM  Treatment Cleansed;Debridement (Selective) 02/15/2013  5:00 PM  Dressing Type Abdominal binder;Tape dressing 02/15/2013  5:00 PM  Dressing Changed New 02/15/2013  5:00 PM  Dressing Status Clean;Dry;Intact 01/25/2013 12:06 PM     Wound 02/15/13 Other (Comment) Leg Left;Medial Small area on left medial calf (Active)  Site / Wound Assessment Dry;Dusky 02/15/2013  5:00 PM  % Wound base Red or Granulating 20% 02/15/2013  5:00 PM  % Wound base Yellow 70% 02/15/2013  5:00 PM  % Wound base Black 10% 02/15/2013  5:00 PM   Peri-wound Assessment Other (Comment) 02/15/2013  5:00 PM  Wound Length (cm) 0.8 cm 02/15/2013  5:00 PM  Wound Width (cm) 1 cm 02/15/2013  5:00 PM  Wound Depth (cm) 1 cm 02/15/2013  5:00 PM  Drainage Amount Other (Comment) 02/15/2013  5:00 PM  Drainage Description Other (Comment) 02/15/2013  5:00 PM  Non-staged Wound Description Not applicable 02/15/2013  5:00 PM  Treatment Cleansed;Debridement (Selective) 02/15/2013  5:00 PM  Dressing Type ABD;Compression wrap 02/15/2013  5:00 PM  Dressing Changed New 02/15/2013  5:00 PM     Wound 02/15/13 Other (Comment) Leg Left;Anterior;Medial Large area distal to small area on L medial shin (Active)  Site / Wound Assessment Dry;Dusky 02/15/2013  5:00 PM  % Wound base Red or Granulating 45% 02/15/2013  5:00 PM  % Wound base Yellow 55% 02/15/2013  5:00 PM  Peri-wound Assessment Other (Comment) 02/15/2013  5:00 PM  Wound Length (cm) 6.3 cm 02/15/2013  5:00 PM  Wound Width (cm) 3.5 cm 02/15/2013  5:00 PM  Wound Depth (cm) 0.2 cm 02/15/2013  5:00 PM  Drainage Amount Other (Comment) 02/15/2013  5:00 PM  Drainage Description Other (Comment) 02/15/2013  5:00 PM  Non-staged Wound Description Not applicable 02/15/2013  5:00 PM  Treatment Cleansed;Debridement (Selective) 02/15/2013  5:00 PM  Dressing Type ABD;Compression wrap;Other (Comment) 02/15/2013  5:00 PM  Dressing Changed New 02/15/2013  5:00 PM   Selective Debridement Selective Debridement - Location: entire wound bed on all wounds Selective Debridement - Tools Used: Forceps Selective Debridement - Tissue Removed: slough, dead tissue  Physical Therapy Assessment and Plan Wound Therapy - Assess/Plan/Recommendations Wound Therapy - Clinical Statement: Pt presents with two new wounds on LLE. All wounds are dry and dusky. Verbal order for wound care on LLE was obtained from Corena Herter, RN for Dr.Neijstrom. Pt educated on velcro compression garments for LE's for increased ease and compliance. Wound Plan: Continue  wound care on BLE.   Problem List Patient Active Problem List  Diagnosis  . Post-phlebitic syndrome  . IVC obstruction  . DVT (deep venous thrombosis)  . H/O PE (pulmonary embolism)  . Congenital inferior vena cava interruption  . Leg wound, left  . IVC (inferior vena cava obstruction)    Seth Bake, PTA 02/15/2013, 6:03 PM

## 2013-02-22 ENCOUNTER — Ambulatory Visit (HOSPITAL_COMMUNITY)
Admission: RE | Admit: 2013-02-22 | Discharge: 2013-02-22 | Disposition: A | Discharge status: Home / Self Care | Payer: Self-pay | Source: Ambulatory Visit | Attending: Internal Medicine | Admitting: Internal Medicine

## 2013-02-22 NOTE — Progress Notes (Signed)
Physical Therapy - Wound Therapy  Treatment   Patient Details  Name: Matthew Wise MRN: [id number] Date of Birth: 06-15-1961  Today's Date: 02/22/2013 Time: 3086-5784 Time Calculation (min): 40 min  Visit#: 10 of 16  Re-eval: 02/22/13 Charges: Selective debridement (= or < 20 cm)   Subjective Subjective Assessment Subjective: Pt states that the compression wrap decreased the pain in his LLE.  Pain Assessment Pain Assessment Pain Score:   3 Pain Location: Leg Pain Orientation: Left;Anterior  Wound Therapy Wound 12/05/12 Other (Comment) Leg Right;Posterior;Lateral draining (Active)  Site / Wound Assessment Dusky;Granulation tissue;Yellow 02/22/2013 10:50 AM  % Wound base Red or Granulating 70% 02/22/2013 10:50 AM  % Wound base Yellow 30% 02/22/2013 10:50 AM  % Wound base Black 0% 02/22/2013 10:50 AM  % Wound base Other (Comment) 0% 02/22/2013 10:50 AM  Peri-wound Assessment Edema 01/25/2013 12:06 PM  Wound Length (cm) 1.8 cm 02/22/2013 10:50 AM  Wound Width (cm) 1.6 cm 02/22/2013 10:50 AM  Wound Depth (cm) 0.2 cm 02/22/2013 10:50 AM  Tunneling (cm) none 12/28/2012  1:54 PM  Undermining (cm) none 12/28/2012  1:54 PM  Margins Attached edges (approximated) 01/04/2013 11:44 AM  Closure None 12/21/2012 11:06 AM  Drainage Amount Moderate 12/28/2012  1:54 PM  Drainage Description Serous 12/28/2012  1:54 PM  Non-staged Wound Description Not applicable 02/22/2013 10:50 AM  Treatment Cleansed;Debridement (Selective) 02/22/2013 10:50 AM  Dressing Type Compression wrap;Gauze (Comment) 02/22/2013 10:50 AM  Dressing Changed New 02/22/2013 10:50 AM  Dressing Status Clean;Dry 02/22/2013 10:50 AM     Wound 02/15/13 Other (Comment) Leg Left;Medial Small area on left medial calf (Active)  Site / Wound Assessment Dry;Dusky 02/15/2013  5:00 PM  % Wound base Red or Granulating 20% 02/15/2013  5:00 PM  % Wound base Yellow 70% 02/15/2013  5:00 PM  % Wound base Black 10% 02/15/2013  5:00 PM  Peri-wound  Assessment Other (Comment) 02/15/2013  5:00 PM  Wound Length (cm) 0.8 cm 02/15/2013  5:00 PM  Wound Width (cm) 1 cm 02/15/2013  5:00 PM  Wound Depth (cm) 1 cm 02/15/2013  5:00 PM  Drainage Amount Other (Comment) 02/15/2013  5:00 PM  Drainage Description Other (Comment) 02/15/2013  5:00 PM  Non-staged Wound Description Not applicable 02/15/2013  5:00 PM  Treatment Cleansed;Debridement (Selective) 02/15/2013  5:00 PM  Dressing Type ABD;Compression wrap 02/15/2013  5:00 PM  Dressing Changed New 02/15/2013  5:00 PM     Wound 02/15/13 Other (Comment) Leg Left;Anterior;Medial Large area distal to small area on L medial shin (Active)  Site / Wound Assessment Dry;Dusky 02/22/2013 10:50 AM  % Wound base Red or Granulating 45% 02/22/2013 10:50 AM  % Wound base Yellow 55% 02/22/2013 10:50 AM  Peri-wound Assessment Other (Comment) 02/22/2013 10:50 AM  Wound Length (cm) 6 cm 02/22/2013 10:50 AM  Wound Width (cm) 3 cm 02/22/2013 10:50 AM  Wound Depth (cm) 0.2 cm 02/22/2013 10:50 AM  Drainage Amount Other (Comment) 02/15/2013  5:00 PM  Drainage Description Other (Comment) 02/15/2013  5:00 PM  Non-staged Wound Description Not applicable 02/22/2013 10:50 AM  Treatment Cleansed;Debridement (Selective) 02/22/2013 10:50 AM  Dressing Type ABD;Compression wrap;Other (Comment) 02/22/2013 10:50 AM  Dressing Changed New 02/15/2013  5:00 PM   Selective Debridement Selective Debridement - Location: entire wound bed on all wounds Selective Debridement - Tools Used: Forceps Selective Debridement - Tissue Removed: slough, dead tissue   Physical Therapy Assessment and Plan Wound Therapy - Assess/Plan/Recommendations Wound Therapy - Clinical Statement: LLE wound has approximated since last  week. Signed order for Juxtalite compression garments given to pt. Compression wrap appeared to decrease swelling in LLE. Pt tolerates debridement well. Wound Plan: Recommend to continue wound care x 4 weeks.      Goals    Problem List Patient  Active Problem List  Diagnosis  . Post-phlebitic syndrome  . IVC obstruction  . DVT (deep venous thrombosis)  . H/O PE (pulmonary embolism)  . Congenital inferior vena cava interruption  . Leg wound, left  . IVC (inferior vena cava obstruction)    Seth Bake, PTA  02/22/2013, 11:08 AM

## 2013-03-01 ENCOUNTER — Ambulatory Visit (HOSPITAL_COMMUNITY): Payer: Self-pay | Admitting: *Deleted

## 2013-03-08 ENCOUNTER — Other Ambulatory Visit (HOSPITAL_COMMUNITY): Payer: Self-pay | Admitting: Oncology

## 2013-03-08 DIAGNOSIS — I82409 Acute embolism and thrombosis of unspecified deep veins of unspecified lower extremity: Secondary | ICD-10-CM

## 2013-03-08 DIAGNOSIS — I871 Compression of vein: Secondary | ICD-10-CM

## 2013-03-08 DIAGNOSIS — I2699 Other pulmonary embolism without acute cor pulmonale: Secondary | ICD-10-CM

## 2013-03-08 MED ORDER — WARFARIN SODIUM 5 MG PO TABS: 10.0000 mg | ORAL_TABLET | Freq: Every day | ORAL | Status: DC

## 2013-03-14 ENCOUNTER — Encounter (HOSPITAL_COMMUNITY): Payer: Medicaid Other | Attending: Oncology

## 2013-03-14 DIAGNOSIS — I82409 Acute embolism and thrombosis of unspecified deep veins of unspecified lower extremity: Secondary | ICD-10-CM

## 2013-03-14 MED ORDER — OXYCODONE HCL 5 MG PO CAPS: 10.0000 mg | ORAL_CAPSULE | ORAL | Status: DC | PRN

## 2013-03-14 NOTE — Progress Notes (Signed)
Labs drawn today for pt 

## 2013-03-22 ENCOUNTER — Ambulatory Visit (HOSPITAL_COMMUNITY)
Admission: RE | Admit: 2013-03-22 | Discharge: 2013-03-22 | Disposition: A | Discharge status: Home / Self Care | Payer: Medicaid Other | Source: Ambulatory Visit | Attending: Oncology | Admitting: Oncology

## 2013-03-22 DIAGNOSIS — I872 Venous insufficiency (chronic) (peripheral): Secondary | ICD-10-CM | POA: Insufficient documentation

## 2013-03-22 DIAGNOSIS — L97809 Non-pressure chronic ulcer of other part of unspecified lower leg with unspecified severity: Secondary | ICD-10-CM | POA: Insufficient documentation

## 2013-03-22 DIAGNOSIS — IMO0001 Reserved for inherently not codable concepts without codable children: Secondary | ICD-10-CM | POA: Insufficient documentation

## 2013-03-22 DIAGNOSIS — Z7901 Long term (current) use of anticoagulants: Secondary | ICD-10-CM | POA: Insufficient documentation

## 2013-03-22 DIAGNOSIS — M25469 Effusion, unspecified knee: Secondary | ICD-10-CM | POA: Insufficient documentation

## 2013-03-22 DIAGNOSIS — I825Z9 Chronic embolism and thrombosis of unspecified deep veins of unspecified distal lower extremity: Secondary | ICD-10-CM | POA: Insufficient documentation

## 2013-03-22 NOTE — Progress Notes (Signed)
Physical Therapy - Wound Therapy  Treatment   Patient Details  Name: Matthew Wise MRN: [id number] Date of Birth: Jul 10, 1961  Today's Date: 03/22/2013 Time: 4098-1191 Time Calculation (min): 30 min  Visit#: 11 of 16  Re-eval: 03/24/13 Charges: Selective debridement (= or < 20 cm)   Subjective Subjective Assessment Subjective: Pt states that he purchased new compression garments and has been wearing them every day.   Pain Assessment Pain Assessment Pain Score:   3 Pain Location: Leg Pain Orientation: Right;Left;Lower  Wound Therapy Wound 12/05/12 Other (Comment) Leg Right;Posterior;Lateral draining (Active)  Site / Wound Assessment Dusky;Granulation tissue;Yellow 03/22/2013 10:07 AM  % Wound base Red or Granulating 70% 03/22/2013 10:07 AM  % Wound base Yellow 30% 03/22/2013 10:07 AM  % Wound base Black 0% 03/22/2013 10:07 AM  % Wound base Other (Comment) 0% 03/22/2013 10:07 AM  Peri-wound Assessment Edema 01/25/2013 12:06 PM  Wound Length (cm) 1.8 cm 02/22/2013 10:50 AM  Wound Width (cm) 1.6 cm 02/22/2013 10:50 AM  Wound Depth (cm) 0.2 cm 02/22/2013 10:50 AM  Tunneling (cm) none 12/28/2012  1:54 PM  Undermining (cm) none 12/28/2012  1:54 PM  Margins Attached edges (approximated) 01/04/2013 11:44 AM  Closure None 12/21/2012 11:06 AM  Drainage Amount Moderate 12/28/2012  1:54 PM  Drainage Description Serous 12/28/2012  1:54 PM  Non-staged Wound Description Not applicable 03/22/2013 10:07 AM  Treatment Cleansed;Debridement (Selective) 03/22/2013 10:07 AM  Dressing Type Tape dressing;ABD 03/22/2013 10:07 AM  Dressing Changed New 03/22/2013 10:07 AM  Dressing Status Clean;Dry 02/22/2013 10:50 AM     Wound 02/15/13 Other (Comment) Leg Left;Medial Small area on left medial calf (Active)  Site / Wound Assessment Dry;Dusky 03/22/2013 10:07 AM  % Wound base Red or Granulating 70% 03/22/2013 10:07 AM  % Wound base Yellow 30% 03/22/2013 10:07 AM  % Wound base Black 0% 03/22/2013 10:07 AM   Peri-wound Assessment Other (Comment) 03/22/2013 10:07 AM  Wound Length (cm) 0.8 cm 02/15/2013  5:00 PM  Wound Width (cm) 1 cm 02/15/2013  5:00 PM  Wound Depth (cm) 1 cm 02/15/2013  5:00 PM  Drainage Amount Other (Comment) 03/22/2013 10:07 AM  Drainage Description Other (Comment) 03/22/2013 10:07 AM  Non-staged Wound Description Not applicable 03/22/2013 10:07 AM  Treatment Cleansed;Debridement (Selective) 03/22/2013 10:07 AM  Dressing Type ABD;Tape dressing 03/22/2013 10:07 AM  Dressing Changed New 03/22/2013 10:07 AM     Wound 02/15/13 Other (Comment) Leg Left;Anterior;Medial Large area distal to small area on L medial shin (Active)  Site / Wound Assessment Dry;Dusky 03/22/2013 10:07 AM  % Wound base Red or Granulating 45% 03/22/2013 10:07 AM  % Wound base Yellow 55% 03/22/2013 10:07 AM  Peri-wound Assessment Other (Comment) 03/22/2013 10:07 AM  Wound Length (cm) 6 cm 02/22/2013 10:50 AM  Wound Width (cm) 3 cm 02/22/2013 10:50 AM  Wound Depth (cm) 0.2 cm 02/22/2013 10:50 AM  Drainage Amount Other (Comment) 03/22/2013 10:07 AM  Drainage Description Other (Comment) 03/22/2013 10:07 AM  Non-staged Wound Description Not applicable 03/22/2013 10:07 AM  Treatment Cleansed;Debridement (Selective) 03/22/2013 10:07 AM  Dressing Type ABD;Tape dressing 03/22/2013 10:07 AM  Dressing Changed New 03/22/2013 10:07 AM   Selective Debridement Selective Debridement - Location: entire wound bed on all wounds Selective Debridement - Tools Used: Forceps;Scalpel Selective Debridement - Tissue Removed: slough, dead tissue   Physical Therapy Assessment and Plan Wound Therapy - Assess/Plan/Recommendations Wound Therapy - Clinical Statement: All wounds appear extremely dry. Vaseline applied liberally to wound perimeter at all open areas. Pt states that  he took dressings off to take a shower this morning. Pt states that he is wearing compression garments every day. Pt was asked to bring garment with him next session. Pt  tolerates debridement well. Wound Plan: Reassess next session.    Problem List Patient Active Problem List  Diagnosis  . Post-phlebitic syndrome  . IVC obstruction  . DVT (deep venous thrombosis)  . H/O PE (pulmonary embolism)  . Congenital inferior vena cava interruption  . Leg wound, left  . IVC (inferior vena cava obstruction)   Seth Bake, PTA 03/22/2013, 10:15 AM

## 2013-03-23 ENCOUNTER — Other Ambulatory Visit (HOSPITAL_COMMUNITY): Payer: Self-pay

## 2013-03-27 ENCOUNTER — Encounter (HOSPITAL_BASED_OUTPATIENT_CLINIC_OR_DEPARTMENT_OTHER): Payer: Medicaid Other

## 2013-03-27 DIAGNOSIS — I82409 Acute embolism and thrombosis of unspecified deep veins of unspecified lower extremity: Secondary | ICD-10-CM

## 2013-03-27 NOTE — Progress Notes (Signed)
Labs drawn today for pt 

## 2013-03-28 ENCOUNTER — Ambulatory Visit (HOSPITAL_COMMUNITY): Payer: Self-pay | Admitting: Physical Therapy

## 2013-04-11 ENCOUNTER — Other Ambulatory Visit (HOSPITAL_COMMUNITY): Payer: Self-pay

## 2013-05-05 ENCOUNTER — Encounter (HOSPITAL_COMMUNITY): Payer: Medicaid Other | Attending: Oncology

## 2013-05-05 ENCOUNTER — Other Ambulatory Visit (HOSPITAL_COMMUNITY): Payer: Self-pay | Admitting: Oncology

## 2013-05-05 ENCOUNTER — Telehealth (HOSPITAL_COMMUNITY): Payer: Self-pay | Admitting: Oncology

## 2013-05-05 DIAGNOSIS — I82409 Acute embolism and thrombosis of unspecified deep veins of unspecified lower extremity: Secondary | ICD-10-CM

## 2013-05-05 DIAGNOSIS — S81802D Unspecified open wound, left lower leg, subsequent encounter: Secondary | ICD-10-CM

## 2013-05-05 LAB — PROTIME-INR: Prothrombin Time: 23.5 seconds — ABNORMAL HIGH (ref 11.6–15.2)

## 2013-05-05 MED ORDER — OXYCODONE HCL 5 MG PO CAPS: 10.0000 mg | ORAL_CAPSULE | ORAL | Status: DC | PRN

## 2013-05-05 NOTE — Progress Notes (Signed)
Labs drawn today for pt 

## 2013-05-15 ENCOUNTER — Other Ambulatory Visit (HOSPITAL_COMMUNITY): Payer: Self-pay | Admitting: Oncology

## 2013-05-15 DIAGNOSIS — S81802D Unspecified open wound, left lower leg, subsequent encounter: Secondary | ICD-10-CM

## 2013-05-17 ENCOUNTER — Ambulatory Visit (HOSPITAL_COMMUNITY)
Admission: RE | Admit: 2013-05-17 | Discharge: 2013-05-17 | Disposition: A | Discharge status: Home / Self Care | Payer: Medicaid Other | Source: Ambulatory Visit | Attending: Internal Medicine | Admitting: Internal Medicine

## 2013-05-17 DIAGNOSIS — IMO0001 Reserved for inherently not codable concepts without codable children: Secondary | ICD-10-CM | POA: Insufficient documentation

## 2013-05-17 DIAGNOSIS — S81009A Unspecified open wound, unspecified knee, initial encounter: Secondary | ICD-10-CM | POA: Insufficient documentation

## 2013-05-17 NOTE — Progress Notes (Signed)
Physical Therapy - Wound Therapy  Evaluation   Patient Details  Name: Matthew Wise MRN: [id number] Date of Birth: September 09, 1961 Charge: evaluation Today's Date: 05/17/2013 Time: 4098-1191 Time Calculation (min): 55 min Subjective Subjective Assessment Subjective:  Pt comes to department with orders to treat B LE wounds.  PT is well known to this cliinc and had been being seen from Jan. thru April of this year at which time pt quit coming to therapy.  Pt has no answer when therapist asks pt where he has been for the past two months. Patient and Family Stated Goals: wound to heal Date of Onset: 06/04/12 Prior Treatments: self dressing change  Pain Assessment Pain Assessment Pain Assessment: No/denies pain (pain goes as high as a 10/10 ) Pain Type: Chronic pain Pain Location: Leg Pain Orientation: Right;Left Pain Descriptors / Indicators: Aching Pain Onset: With Activity  Wound Therapy Wound 12/05/12 Other (Comment) Leg Posterior;Left;Medial draining (Active)  Site / Wound Assessment Yellow;Red 05/17/2013 11:54 AM  % Wound base Red or Granulating 80% 05/17/2013 11:54 AM  % Wound base Yellow 20% 05/17/2013 11:54 AM  % Wound base Black 0% 05/17/2013 11:54 AM  % Wound base Other (Comment) 0% 05/17/2013 11:54 AM  Peri-wound Assessment Edema;Induration 05/17/2013 11:54 AM  Wound Length (cm) 2 cm 05/17/2013 11:54 AM  Wound Width (cm) 4 cm 05/17/2013 11:54 AM  Wound Depth (cm) 0.3 cm 05/17/2013 11:54 AM  Undermining (cm) 12:00 on clock undermining  .3cm; 3:00 on clock undermining .6 cm;  9:00 on clock undermining .2 cm 12/08/2012  9:28 AM  Margins Attached edges (approximated) 01/04/2013 11:44 AM  Closure None 05/17/2013 11:54 AM  Drainage Amount Minimal 05/17/2013 11:54 AM  Drainage Description Serous 05/17/2013 11:54 AM  Non-staged Wound Description Not applicable 03/22/2013 10:07 AM  Treatment Cleansed;Debridement (Selective) 05/17/2013 11:54 AM  Dressing Type Honeycomb;Compression wrap 05/17/2013  11:54 AM  Dressing Changed Changed 05/17/2013 11:54 AM  Dressing Status Clean 05/17/2013 11:54 AM     Wound 02/15/13 Other (Comment) Leg Left;Medial Small area on left medial calf (Active)  Site / Wound Assessment Dry;Yellow;Dusky 05/17/2013 11:54 AM  % Wound base Red or Granulating 0% 05/17/2013 11:54 AM  % Wound base Yellow 100% 05/17/2013 11:54 AM  % Wound base Black 0% 05/17/2013 11:54 AM  Peri-wound Assessment Edema;Induration 05/17/2013 11:54 AM  Wound Length (cm) 1 cm 05/17/2013 11:54 AM  Wound Width (cm) 1.2 cm 05/17/2013 11:54 AM  Wound Depth (cm) 0.2 cm 05/17/2013 11:54 AM  Drainage Amount Minimal 05/17/2013 11:54 AM  Drainage Description Serous 05/17/2013 11:54 AM  Non-staged Wound Description Full thickness 05/17/2013 11:54 AM  Treatment Cleansed;Debridement (Selective) 05/17/2013 11:54 AM  Dressing Type Honeycomb;Compression wrap 05/17/2013 11:54 AM  Dressing Changed New 05/17/2013 11:54 AM  Dressing Status Clean 05/17/2013 11:54 AM     Wound 02/15/13 Other (Comment) Leg Left;Anterior;Medial Large area distal to small area on L medial shin (Active)  Site / Wound Assessment Yellow 05/17/2013 11:54 AM  % Wound base Red or Granulating 10% 05/17/2013 11:54 AM  % Wound base Yellow 90% 05/17/2013 11:54 AM  % Wound base Black 0% 05/17/2013 11:54 AM  % Wound base Other (Comment) 0% 05/17/2013 11:54 AM  Peri-wound Assessment Edema;Induration 05/17/2013 11:54 AM  Wound Length (cm) 3.5 cm 05/17/2013 11:54 AM  Wound Width (cm) 2.5 cm 05/17/2013 11:54 AM  Wound Depth (cm) 0.3 cm 05/17/2013 11:54 AM  Drainage Amount Other (Comment) 03/22/2013 10:07 AM  Drainage Description Other (Comment) 03/22/2013 10:07 AM  Non-staged Wound Description Not  applicable 03/22/2013 10:07 AM  Treatment Cleansed;Debridement (Selective) 03/22/2013 10:07 AM  Dressing Type ABD;Tape dressing 03/22/2013 10:07 AM  Dressing Changed Changed 05/17/2013 11:54 AM     Wound 05/17/13 Other (Comment) Left;Posterior;Medial (Active)  Site / Wound  Assessment Red 05/17/2013 11:54 AM  % Wound base Red or Granulating 85% 05/17/2013 11:54 AM  % Wound base Yellow 15% 05/17/2013 11:54 AM  % Wound base Black 0% 05/17/2013 11:54 AM  % Wound base Other (Comment) 0% 05/17/2013 11:54 AM  Peri-wound Assessment Edema;Induration 05/17/2013 11:54 AM  Wound Length (cm) 2.5 cm 05/17/2013 11:54 AM  Wound Width (cm) 2 cm 05/17/2013 11:54 AM  Wound Depth (cm) 0.2 cm 05/17/2013 11:54 AM  Closure None 05/17/2013 11:54 AM  Drainage Amount None 05/17/2013 11:54 AM  Drainage Description Serous 05/17/2013 11:54 AM  Treatment Cleansed;Debridement (Selective) 05/17/2013 11:54 AM  Dressing Type Honeycomb;Compression wrap 05/17/2013 11:54 AM  Dressing Changed Changed 05/17/2013 11:54 AM  Dressing Status Clean 05/17/2013 11:54 AM   Selective Debridement Selective Debridement - Location: wound bed on L Anterior wounds. Selective Debridement - Tools Used: Forceps Selective Debridement - Tissue Removed: slough   Physical Therapy Assessment and Plan Wound Therapy - Assess/Plan/Recommendations Wound Therapy - Clinical Statement: Pt comes to department with a new wound as well as the three wounds we were treating in April.  Pt does has his jux-light on but the patient does not have the foot part of the Jux on nor does he have them pulled tight.  Pt skin is very dry pt is not moisturizing on a consistant basis.  Pt given instructions to buy Eucerin lotion and to moisturize every evening.  PT also explained the importance of wearing the Jux correctly including wearing the foot part and pulling jux tightly.  Pt also instructed to raise B LE above heart 30 minutes a day twice a day.   Factors Delaying/Impairing Wound Healing: Infection - systemic/local;Multiple medical problems Hydrotherapy Plan: Debridement;Dressing change;Patient/family education Wound Therapy - Frequency:  (2x / week) Wound Therapy - Current Recommendations: PT Wound Plan: Pt to be seen for manual decongestive  techniques to decrease swelling and wound care.      Goals Wound Therapy Goals - Improve the function of patient's integumentary system by progressing the wound(s) through the phases of wound healing by: Decrease Necrotic Tissue to: 0% Decrease Necrotic Tissue - Progress: Goal set today Increase Granulation Tissue to: 100% Increase Granulation Tissue - Progress: Goal set today Decrease Length/Width/Depth by (cm): 1x.7x.5 Decrease Length/Width/Depth - Progress: Goal set today Improve Drainage Characteristics:  (scant) Patient/Family will be able to : verbalize the importance of wearing compression stockings correctly Patient/Family Instruction Goal - Progress: Goal set today Additional Wound Therapy Goal: verbalize the importance of moisturizing daily Additional Wound Therapy Goal - Progress: Goal set today Time For Goal Achievement:  (4 weeks) Wound Therapy - Potential for Goals: Good  Problem List Patient Active Problem List   Diagnosis Date Noted  . 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 Leg wound, left 08/19/2011  . DVT (deep venous thrombosis) 06/15/2011  . H/O PE (pulmonary embolism) 06/15/2011  . 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 IVC obstruction 06/12/2011     Daivon Rayos,CINDY 05/17/2013, 12:16 PM   Your signature is required to indicate approval of the treatment plan as stated above.  Please sign and return making a copy for your files.  You may hard copy or send electronically.  Please check one: ___1.  Approve of this plan  ___2.  Approve of this plan with the following changes.   ____________________________                             _____________ Physician                                                                      Date

## 2013-05-19 ENCOUNTER — Other Ambulatory Visit (HOSPITAL_COMMUNITY): Payer: Self-pay

## 2013-05-22 ENCOUNTER — Ambulatory Visit (HOSPITAL_COMMUNITY)
Admission: RE | Admit: 2013-05-22 | Discharge: 2013-05-22 | Disposition: A | Discharge status: Home / Self Care | Payer: Medicaid Other | Source: Ambulatory Visit | Attending: Internal Medicine | Admitting: Internal Medicine

## 2013-05-22 NOTE — Progress Notes (Signed)
Physical Therapy - Wound Therapy  Treatment   Patient Details  Name: Matthew Wise MRN: [id number] Date of Birth: 04/16/1961  Today's Date: 05/22/2013 Time: 1478-2956 Time Calculation (min): 33 min  Visit#: 2 of 8  Re-eval: 03/24/13 Charges: Selective debridement (= or < 20 cm)   Subjective Subjective Assessment Subjective: Pt states that her changed bandages this morning.   Pain Assessment Pain Assessment Pain Assessment: No/denies pain  Wound Therapy Wound 12/05/12 Other (Comment) Leg Posterior;Medial;Right draining (Active)  Site / Wound Assessment Yellow;Red 05/22/2013  1:00 PM  % Wound base Red or Granulating 80% 05/22/2013  1:00 PM  % Wound base Yellow 20% 05/22/2013  1:00 PM  % Wound base Black 0% 05/22/2013  1:00 PM  % Wound base Other (Comment) 0% 05/22/2013  1:00 PM  Peri-wound Assessment Edema;Induration 05/22/2013  1:00 PM  Wound Length (cm) 2 cm 05/17/2013 11:54 AM  Wound Width (cm) 4 cm 05/17/2013 11:54 AM  Wound Depth (cm) 0.3 cm 05/17/2013 11:54 AM  Undermining (cm) 12:00 on clock undermining  .3cm; 3:00 on clock undermining .6 cm;  9:00 on clock undermining .2 cm 12/08/2012  9:28 AM  Margins Attached edges (approximated) 01/04/2013 11:44 AM  Closure None 05/22/2013  1:00 PM  Drainage Amount Minimal 05/22/2013  1:00 PM  Drainage Description Serous 05/22/2013  1:00 PM  Non-staged Wound Description Not applicable 03/22/2013 10:07 AM  Treatment Cleansed;Debridement (Selective) 05/22/2013  1:00 PM  Dressing Type Honeycomb;Compression wrap 05/22/2013  1:00 PM  Dressing Changed Changed 05/22/2013  1:00 PM  Dressing Status Clean 05/22/2013  1:00 PM     Wound 02/15/13 Other (Comment) Leg Left;Medial Small area on left medial calf (Active)  Site / Wound Assessment Dry;Yellow;Dusky 05/22/2013  1:00 PM  % Wound base Red or Granulating 20% 05/22/2013  1:00 PM  % Wound base Yellow 80% 05/22/2013  1:00 PM  % Wound base Black 0% 05/22/2013  1:00 PM  Peri-wound Assessment Edema;Induration  05/22/2013  1:00 PM  Wound Length (cm) 1 cm 05/17/2013 11:54 AM  Wound Width (cm) 1.2 cm 05/17/2013 11:54 AM  Wound Depth (cm) 0.2 cm 05/17/2013 11:54 AM  Drainage Amount Minimal 05/22/2013  1:00 PM  Drainage Description Serous 05/22/2013  1:00 PM  Non-staged Wound Description Full thickness 05/22/2013  1:00 PM  Treatment Cleansed;Debridement (Selective) 05/22/2013  1:00 PM  Dressing Type Compression wrap;Other (Comment) 05/22/2013  1:00 PM  Dressing Changed New 05/22/2013  1:00 PM  Dressing Status Clean 05/22/2013  1:00 PM     Wound 02/15/13 Other (Comment) Leg Left;Anterior;Medial Large area distal to small area on L medial shin (Active)  Site / Wound Assessment Yellow 05/22/2013  1:00 PM  % Wound base Red or Granulating 10% 05/22/2013  1:00 PM  % Wound base Yellow 90% 05/22/2013  1:00 PM  % Wound base Black 0% 05/22/2013  1:00 PM  % Wound base Other (Comment) 0% 05/22/2013  1:00 PM  Peri-wound Assessment Edema;Induration 05/22/2013  1:00 PM  Wound Length (cm) 3.5 cm 05/17/2013 11:54 AM  Wound Width (cm) 2.5 cm 05/17/2013 11:54 AM  Wound Depth (cm) 0.3 cm 05/17/2013 11:54 AM  Drainage Amount Other (Comment) 03/22/2013 10:07 AM  Drainage Description Other (Comment) 03/22/2013 10:07 AM  Non-staged Wound Description Not applicable 03/22/2013 10:07 AM  Treatment Cleansed;Debridement (Selective) 05/22/2013  1:00 PM  Dressing Type Compression wrap;Other (Comment) 05/22/2013  1:00 PM  Dressing Changed New 05/22/2013  1:00 PM     Wound 05/17/13 Other (Comment) Left;Posterior;Medial (Active)  Site / Wound  Assessment Red 05/22/2013  1:00 PM  % Wound base Red or Granulating 85% 05/22/2013  1:00 PM  % Wound base Yellow 15% 05/22/2013  1:00 PM  % Wound base Black 0% 05/22/2013  1:00 PM  % Wound base Other (Comment) 0% 05/22/2013  1:00 PM  Peri-wound Assessment Edema;Induration 05/22/2013  1:00 PM  Wound Length (cm) 2.5 cm 05/17/2013 11:54 AM  Wound Width (cm) 2 cm 05/17/2013 11:54 AM  Wound Depth (cm) 0.2 cm 05/17/2013  11:54 AM  Closure None 05/22/2013  1:00 PM  Drainage Amount None 05/22/2013  1:00 PM  Drainage Description Serous 05/22/2013  1:00 PM  Treatment Cleansed;Debridement (Selective) 05/22/2013  1:00 PM  Dressing Type Compression wrap;Other (Comment) 05/22/2013  1:00 PM  Dressing Changed Changed 05/22/2013  1:00 PM  Dressing Status Clean 05/22/2013  1:00 PM   Selective Debridement Selective Debridement - Location: wound bed on L Anterior wounds. Selective Debridement - Tools Used: Forceps Selective Debridement - Tissue Removed: slough   Physical Therapy Assessment and Plan Wound Therapy - Assess/Plan/Recommendations Wound Therapy - Clinical Statement: Pt enters with compression wraps on correctly (including foot piece). Skin integrity has improved. Pt tolerate debridement well. Therapist able to remove significant amount of necrotic tissue this session. Wound Plan: Continue wound care per PT POC.  Problem List Patient Active Problem List   Diagnosis Date Noted  . 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 Leg wound, left 08/19/2011  . DVT (deep venous thrombosis) 06/15/2011  . H/O PE (pulmonary embolism) 06/15/2011  . 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

## 2013-05-25 ENCOUNTER — Ambulatory Visit (HOSPITAL_COMMUNITY): Payer: Self-pay | Admitting: *Deleted

## 2013-05-25 ENCOUNTER — Telehealth (HOSPITAL_COMMUNITY): Payer: Self-pay

## 2013-05-29 ENCOUNTER — Ambulatory Visit (HOSPITAL_COMMUNITY)
Admission: RE | Admit: 2013-05-29 | Discharge: 2013-05-29 | Disposition: A | Discharge status: Home / Self Care | Payer: Medicaid Other | Source: Ambulatory Visit | Attending: Internal Medicine | Admitting: Internal Medicine

## 2013-05-29 NOTE — Progress Notes (Signed)
Physical Therapy - Wound Therapy  Treatment   Patient Details  Name: Matthew Wise MRN: [id number] Date of Birth: 08-08-1961  Today's Date: 05/29/2013 Time: 7829-5621 Time Calculation (min): 30 min Visit#: 3 of 8  Re-eval: 03/24/13 Charges: deb <20cm  Subjective Subjective Assessment Subjective: Pt states his legs are itchy today.  Pt had juxtalite garment on today.  Pain Assessment Pain Assessment Pain Assessment: No/denies pain  Wound Therapy Wound 12/05/12 Other (Comment) Leg Posterior;Medial;Right draining (Active)  Site / Wound Assessment Yellow;Red 05/29/2013  2:15 PM  % Wound base Red or Granulating 80% 05/29/2013  2:15 PM  % Wound base Yellow 20% 05/29/2013  2:15 PM  % Wound base Black 0% 05/29/2013  2:15 PM  % Wound base Other (Comment) 0% 05/29/2013  2:15 PM  Peri-wound Assessment Edema;Induration 05/29/2013  2:15 PM  Wound Length (cm) 2 cm 05/17/2013 11:54 AM  Wound Width (cm) 4 cm 05/17/2013 11:54 AM  Wound Depth (cm) 0.3 cm 05/17/2013 11:54 AM  Undermining (cm) 12:00 on clock undermining  .3cm; 3:00 on clock undermining .6 cm;  9:00 on clock undermining .2 cm 12/08/2012  9:28 AM  Margins Attached edges (approximated) 01/04/2013 11:44 AM  Closure None 05/29/2013  2:15 PM  Drainage Amount Minimal 05/29/2013  2:15 PM  Drainage Description Serous 05/29/2013  2:15 PM  Non-staged Wound Description Not applicable 03/22/2013 10:07 AM  Treatment Cleansed;Debridement (Selective) 05/29/2013  2:15 PM  Dressing Type Hydrocolloid medihoney 05/29/2013  2:15 PM  Dressing Changed Changed 05/29/2013  2:15 PM  Dressing Status Clean;Dry;Intact 05/29/2013  2:15 PM     Wound 02/15/13 Other (Comment) Leg Left;Medial Small area on left medial calf (Active)  Site / Wound Assessment Dry;Yellow;Dusky 05/29/2013  2:15 PM  % Wound base Red or Granulating 50% 05/29/2013  2:15 PM  % Wound base Yellow 50% 05/29/2013  2:15 PM  % Wound base Black 0% 05/29/2013  2:15 PM  Peri-wound Assessment Edema;Induration  05/29/2013  2:15 PM  Wound Length (cm) 1 cm 05/17/2013 11:54 AM  Wound Width (cm) 1.2 cm 05/17/2013 11:54 AM  Wound Depth (cm) 0.2 cm 05/17/2013 11:54 AM  Drainage Amount Scant 05/29/2013  2:15 PM  Drainage Description Serous 05/29/2013  2:15 PM  Non-staged Wound Description Not applicable 05/29/2013  2:15 PM  Treatment Cleansed;Debridement (Selective) 05/29/2013  2:15 PM  Dressing Type Impregnated gauze (bismuth) 05/29/2013  2:15 PM  Dressing Changed Changed 05/29/2013  2:15 PM  Dressing Status Clean;Dry;Intact 05/29/2013  2:15 PM     Wound 02/15/13 Other (Comment) Leg Left;Anterior;Medial Large area distal to small area on L medial shin (Active)  Site / Wound Assessment Pale;Pink;Yellow 05/29/2013  2:15 PM  % Wound base Red or Granulating 25% 05/29/2013  2:15 PM  % Wound base Yellow 75% 05/29/2013  2:15 PM  % Wound base Black 0% 05/29/2013  2:15 PM  % Wound base Other (Comment) 0% 05/29/2013  2:15 PM  Peri-wound Assessment Edema;Induration 05/29/2013  2:15 PM  Wound Length (cm) 3.5 cm 05/17/2013 11:54 AM  Wound Width (cm) 2.5 cm 05/17/2013 11:54 AM  Wound Depth (cm) 0.3 cm 05/17/2013 11:54 AM  Drainage Amount Other (Comment) 03/22/2013 10:07 AM  Drainage Description Other (Comment) 03/22/2013 10:07 AM  Non-staged Wound Description Not applicable 03/22/2013 10:07 AM  Treatment Cleansed;Debridement (Selective) 05/29/2013  2:15 PM  Dressing Type Impregnated gauze (bismuth) 05/29/2013  2:15 PM  Dressing Changed Changed 05/29/2013  2:15 PM  Dressing Status Clean;Dry;Intact 05/29/2013  2:15 PM     Wound 05/17/13 Other (Comment)  Left;Posterior;Medial (Active)  Site / Wound Assessment Red 05/29/2013  2:15 PM  % Wound base Red or Granulating 90% 05/29/2013  2:15 PM  % Wound base Yellow 10% 05/29/2013  2:15 PM  % Wound base Black 0% 05/29/2013  2:15 PM  % Wound base Other (Comment) 0% 05/29/2013  2:15 PM  Peri-wound Assessment Edema;Induration 05/29/2013  2:15 PM  Wound Length (cm) 2.5 cm 05/17/2013 11:54 AM  Wound  Width (cm) 2 cm 05/17/2013 11:54 AM  Wound Depth (cm) 0.2 cm 05/17/2013 11:54 AM  Closure None 05/29/2013  2:15 PM  Drainage Amount Scant 05/29/2013  2:15 PM  Drainage Description Serous 05/29/2013  2:15 PM  Treatment Cleansed;Debridement (Selective) 05/29/2013  2:15 PM  Dressing Type Impregnated gauze (bismuth) 05/29/2013  2:15 PM  Dressing Changed Changed 05/29/2013  2:15 PM  Dressing Status Clean;Dry;Intact 05/29/2013  2:15 PM   Selective Debridement Selective Debridement - Location: wound bed on all wounds Bilateral LE's Selective Debridement - Tools Used: Forceps Selective Debridement - Tissue Removed: slough   Physical Therapy Assessment and Plan Wound Therapy - Assess/Plan/Recommendations Wound Therapy - Clinical Statement: Pt  with continued compliance with compression garments.  medihoney colloid dressing very adherent to wounds on Lt LE, with difficulty removing.  Changed dressing to xeroform to increase moisture.  Continued honey colloid dressing on Rt LE wound.  Moisturized Bilateral LE's with lotion and advised pt. to only use lotions, not vaseline.  Also educated pt on 1/2 white vinegar/water mixture to help decrease fungal areas on Bilateral LE's. Wound Plan: Continue wound care per PT POC.       Problem List Patient Active Problem List   Diagnosis Date Noted  . 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 Leg wound, left 08/19/2011  . DVT (deep venous thrombosis) 06/15/2011  . H/O PE (pulmonary embolism) 06/15/2011  . 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

## 2013-06-01 ENCOUNTER — Ambulatory Visit (HOSPITAL_COMMUNITY)
Admission: RE | Admit: 2013-06-01 | Discharge: 2013-06-01 | Disposition: A | Discharge status: Home / Self Care | Payer: Medicaid Other | Source: Ambulatory Visit | Attending: Internal Medicine | Admitting: Internal Medicine

## 2013-06-01 DIAGNOSIS — S81009A Unspecified open wound, unspecified knee, initial encounter: Secondary | ICD-10-CM | POA: Insufficient documentation

## 2013-06-01 DIAGNOSIS — IMO0001 Reserved for inherently not codable concepts without codable children: Secondary | ICD-10-CM | POA: Insufficient documentation

## 2013-06-01 NOTE — Progress Notes (Signed)
Physical Therapy - Wound Therapy  Treatment   Patient Details  Name: Matthew Wise MRN: [id number] Date of Birth: 10/11/1961  Today's Date: 06/01/2013 Time: 4098-1191 Time Calculation (min): 35 min Visit#: 4 of 8  Re-eval: 06/14/13 Charges:  Reece Levy <20cm  Subjective Subjective Assessment Subjective: Pt with juxtalite garments on, however dressings were removed and wounds did not have any covering on them.   Wound Therapy Wound 12/05/12 Other (Comment) Leg Posterior;Medial;Right draining (Active)  Site / Wound Assessment Yellow;Red 06/01/2013  3:00 PM  % Wound base Red or Granulating 85% 06/01/2013  3:00 PM  % Wound base Yellow 15% 06/01/2013  3:00 PM  % Wound base Black 0% 06/01/2013  3:00 PM  % Wound base Other (Comment) 0% 06/01/2013  3:00 PM  Peri-wound Assessment Edema;Induration 06/01/2013  3:00 PM  Wound Length (cm) 2 cm 05/17/2013 11:54 AM  Wound Width (cm) 4 cm 05/17/2013 11:54 AM  Wound Depth (cm) 0.3 cm 05/17/2013 11:54 AM  Undermining (cm) 12:00 on clock undermining  .3cm; 3:00 on clock undermining .6 cm;  9:00 on clock undermining .2 cm 12/08/2012  9:28 AM  Margins Attached edges (approximated) 01/04/2013 11:44 AM  Closure None 06/01/2013  3:00 PM  Drainage Amount Minimal 06/01/2013  3:00 PM  Drainage Description Serous 06/01/2013  3:00 PM  Non-staged Wound Description Not applicable 03/22/2013 10:07 AM  Treatment Cleansed;Debridement (Selective) 06/01/2013  3:00 PM  Dressing Type Hydrocolloid 06/01/2013  3:00 PM  Dressing Changed Changed 06/01/2013  3:00 PM  Dressing Status Clean;Dry;Intact 06/01/2013  3:00 PM     Wound 02/15/13 Other (Comment) Leg Left;Anterior;Medial Large area distal to small area on L medial shin (Active)  Site / Wound Assessment Pale;Pink;Yellow 06/01/2013  3:00 PM  % Wound base Red or Granulating 85% 06/01/2013  3:00 PM  % Wound base Yellow 15% 06/01/2013  3:00 PM  % Wound base Black 0% 06/01/2013  3:00 PM  % Wound base Other (Comment) 0% 06/01/2013  3:00 PM  Peri-wound  Assessment Edema;Induration 06/01/2013  3:00 PM  Wound Length (cm) 3.5 cm 05/17/2013 11:54 AM  Wound Width (cm) 2.5 cm 05/17/2013 11:54 AM  Wound Depth (cm) 0.3 cm 05/17/2013 11:54 AM  Drainage Amount Other (Comment) 03/22/2013 10:07 AM  Drainage Description Other (Comment) 03/22/2013 10:07 AM  Non-staged Wound Description Not applicable 03/22/2013 10:07 AM  Treatment Cleansed;Debridement (Selective) 06/01/2013  3:00 PM  Dressing Type Impregnated gauze (bismuth) 06/01/2013  3:00 PM  Dressing Changed Changed 06/01/2013  3:00 PM  Dressing Status Clean;Dry;Intact 06/01/2013  3:00 PM     Wound 05/17/13 Other (Comment) Left;Posterior;Medial (Active)  Site / Wound Assessment Red 06/01/2013  3:00 PM  % Wound base Red or Granulating 100% 06/01/2013  3:00 PM  % Wound base Yellow 0% 06/01/2013  3:00 PM  % Wound base Black 0% 06/01/2013  3:00 PM  % Wound base Other (Comment) 0% 06/01/2013  3:00 PM  Peri-wound Assessment Edema;Induration 06/01/2013  3:00 PM  Wound Length (cm) 2.5 cm 05/17/2013 11:54 AM  Wound Width (cm) 2 cm 05/17/2013 11:54 AM  Wound Depth (cm) 0.2 cm 05/17/2013 11:54 AM  Closure None 06/01/2013  3:00 PM  Drainage Amount Scant 06/01/2013  3:00 PM  Drainage Description Serous 06/01/2013  3:00 PM  Treatment Cleansed;Debridement (Selective) 06/01/2013  3:00 PM  Dressing Type Impregnated gauze (bismuth) 06/01/2013  3:00 PM  Dressing Changed Changed 06/01/2013  3:00 PM  Dressing Status Clean;Dry;Intact 06/01/2013  3:00 PM    Selective Debridement Selective Debridement - Location: wound bed  on all wounds Bilateral LE's Selective Debridement - Tools Used: Forceps Selective Debridement - Tissue Removed: slough   Physical Therapy Assessment and Plan Wound Therapy - Assess/Plan/Recommendations Wound Therapy - Clinical Statement: Pt  with continued compliance with compression garments, however instructed to keep dressing over wounds to promote healing.  L LE responded well to change to xeroform Changed dressing to xeroform to  increase moisture.  Continued honey colloid dressing on Rt LE wound.  Moisturized Bilateral LE's with lotion and advised pt. to only use lotions, not vaseline.  Also educated pt on 1/2 white vinegar/water mixture to help decrease fungal areas on Bilateral LE's. Wound Plan: Continue wound care per PT POC.      Problem List Patient Active Problem List   Diagnosis Date Noted  . 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 Leg wound, left 08/19/2011  . DVT (deep venous thrombosis) 06/15/2011  . H/O PE (pulmonary embolism) 06/15/2011  . 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

## 2013-06-04 NOTE — Progress Notes (Signed)
[name]., MD [address] [place] [address]  DVT (deep venous thrombosis), bilateral  H/O PE (pulmonary embolism)  Congenital inferior vena cava interruption  IVC obstruction  CURRENT THERAPY: Anticoagulated with Coumadin (10/12.5 mg)  INTERVAL HISTORY: [name] 52 y.o. 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 Patient seen by Dr. Clemetine Marker at [facility] who found that he also has sickle cell trait which increases the patient's risk of DVTs. He did not have cardiolipin antibodies, nor beta-2 glycoprotein antibodies.  It is believed by both, Dr. Mikel Cella and [place], that he is not a good candidate for ongoing work and is a candidate for permanent disability.  Hematologically, the patient is doing well. He denies any signs or symptoms of DVT or PE.  I have reviewed the patient's chart and is indicative that is been noncompliant with appointments and laboratory appointments.  As a result of his noncompliance, we spent some time today discussing Xarelto therapy. We discussed the benefits and down falls of therapy. I spent some time discussing that there is not an easy reversal mechanism for Xarelto unlike Coumadin. He was educated that Coumadin has a higher since of intracranial bleeding compared to Xarelto. On the other hand, Xarelto has an increased risk of GI bleeding compared to Coumadin.  I personally reviewed and went over laboratory results with the patient.  INR today is 1.42. I question the patient on his compliance with Coumadin and he reports that he missed 2 or 3 doses over July 4 holiday. As a result, we'll keep him on the same dose of Coumadin and check an INR in 7 days.  He is agreeable to Xarelto therapy  I discussed this with our Artist. She will work on getting him this medication and requested that he call her tomorrow. Her business card is provided to the patient today for him to call her tomorrow.  Hematologically, the patient denies any complaints and ROS questioning is negative.   Smoking cessation education is provided today. He reports that he has transitioned to the electronic cigarettes and this has helped him get under one pack per day, but I suspect is close to one pack per day because he is unable to tell me exactly how many cigarettes he smokes in 24 hours.  Past Medical History  Diagnosis Date  . DVT of leg (deep venous thrombosis)     rt. leg  . Inferior vena caval thrombosis     Chronic  . DVT (deep venous thrombosis) 06/15/2011  . H/O PE (pulmonary embolism) 06/15/2011  . 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     has No Known Allergies.  Mr. Hornback does not currently have medications on file.  No past surgical history on file.  Denies any headaches, dizziness, double vision, fevers, chills, night sweats, nausea, vomiting, diarrhea, constipation, chest pain, heart palpitations, shortness of breath, blood in stool, black tarry stool, urinary pain, urinary burning, urinary frequency, hematuria.   PHYSICAL EXAMINATION  ECOG PERFORMANCE STATUS: 1 - Symptomatic but completely ambulatory  There were no  vitals filed for this visit.  GENERAL:alert, no distress, well nourished, well developed, comfortable, cooperative, obese and smiling SKIN: skin color, texture, turgor are normal, no rashes or significant lesions HEAD: Normocephalic, No masses, lesions, tenderness or abnormalities EYES: normal, PERRLA, EOMI,  Conjunctiva are pink and non-injected EARS: External ears normal OROPHARYNX:mucous membranes are moist  NECK: supple, no adenopathy, thyroid normal size, non-tender, without nodularity, no stridor, non-tender, trachea midline LYMPH:  no palpable lymphadenopathy BREAST:not examined LUNGS: clear to auscultation and percussion HEART: regular rate & rhythm, no murmurs, no gallops, S1 normal and S2 normal ABDOMEN:abdomen soft, non-tender, obese and normal bowel sounds BACK: Back symmetric, no curvature., No CVA tenderness EXTREMITIES:less then 2 second capillary refill, no joint deformities, effusion, or inflammation, no skin discoloration, no clubbing, no cyanosis, positive findings:  Bilateral lower extremities are cleanly wrapped by the wound clinic.  NEURO: alert & oriented x 3 with fluent speech, no focal motor/sensory deficits, gait normal   LABORATORY DATA: CBC    Component Value Date/Time   WBC 5.2 03/16/2012 1254   RBC 5.50 03/16/2012 1254   HGB 17.0 03/16/2012 1254   HCT 48.1 03/16/2012 1254   PLT 183 03/16/2012 1254   MCV 87.5 03/16/2012 1254   MCH 30.9 03/16/2012 1254   MCHC 35.3 03/16/2012 1254   RDW 13.9 03/16/2012 1254   LYMPHSABS 1.5 03/16/2012 1254   MONOABS 0.4 03/16/2012 1254   EOSABS 0.1 03/16/2012 1254   BASOSABS 0.0 03/16/2012 1254      Chemistry      Component Value Date/Time   NA 138 03/16/2012 1254   K 3.7 03/16/2012 1254   CL 101 03/16/2012 1254   CO2 27 03/16/2012 1254   BUN 8 03/16/2012 1254   CREATININE 0.96 03/16/2012 1254      Component Value Date/Time   CALCIUM 9.7 03/16/2012 1254   ALKPHOS 84 03/16/2012 1254   AST 32 03/16/2012 1254   ALT 29 03/16/2012 1254   BILITOT 0.8 03/16/2012 1254     Lab Results  Component Value Date   INR 2.20* 05/05/2013   INR 2.25* 03/27/2013   INR 1.34 03/14/2013      ASSESSMENT:  1. 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 Patient seen by Dr. Clemetine Marker at [facility] who found that he also has sickle cell trait which increases the patient's risk of DVTs. He did not have cardiolipin antibodies, nor beta-2 glycoprotein antibodies.  It is believed by both, Dr. Mikel Cella and [place], that he is not a good candidate for ongoing work and is a candidate for permanent disability. 2. Poor compliance with Coumadin and PT/INR appointments 3. Poor candidate for ongoing work 4. Candidate for permanent disability 5. Continued Tobacco abuse  Patient Active Problem List   Diagnosis Date Noted  . 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 Leg wound, left 08/19/2011  . DVT (deep venous thrombosis) 06/15/2011  . H/O PE (pulmonary embolism) 06/15/2011  . 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 I personally reviewed and went over laboratory results with the patient. 2. Discussion regarding compliance 3. Continue same dose of Coumadin 10/12.5 mg alternating as he has missed doses over the weekend. 4. INR in 7 days 5. Discussion regarding Xarelto 6. He wishes to  transition to Xarelto therapy.  7. Discussed this decision with April Manson, Financial Counselor.  She will work on getting him the medication and he is to call her tomorrow. Her business card is provided to the patient. 8. Continue with Coumadin until Xarelto is available to him. 9. Return in 3 months for follow-up.   THERAPY PLAN:  Spent some time with the patient is a discussing his compliance.  We broached the topic of switching him to Xarelto therapy and he is agreeable to this after discussion of the risks, benefits, alternatives, and side effects of therapy.  I discussed this with our financial counselor she'll work on getting this medication.  He is to continue his Coumadin until  he has Xarelto available to him. We'll check an INR in 7 days and he'll continue with the same dose of Coumadin as he is missed a few doses of the Fourth of July holiday weekend. We'll see him back in 3 months time for follow.   All questions were answered. The patient knows to call the clinic with any problems, questions or concerns. We can certainly see the patient much sooner if necessary.  Patient and plan discussed with Dr. Janese Banks and he is in agreement with the aforementioned.   Stephannie Broner

## 2013-06-05 ENCOUNTER — Encounter (HOSPITAL_COMMUNITY): Payer: Medicaid Other | Attending: Oncology | Admitting: Oncology

## 2013-06-05 ENCOUNTER — Ambulatory Visit (HOSPITAL_COMMUNITY)
Admission: RE | Admit: 2013-06-05 | Discharge: 2013-06-05 | Disposition: A | Discharge status: Home / Self Care | Payer: Medicaid Other | Source: Ambulatory Visit | Attending: Internal Medicine | Admitting: Internal Medicine

## 2013-06-05 VITALS — BP 131/82 | HR 71 | Temp 98.1°F | Resp 18 | Wt 285.2 lb

## 2013-06-05 DIAGNOSIS — I82409 Acute embolism and thrombosis of unspecified deep veins of unspecified lower extremity: Secondary | ICD-10-CM | POA: Insufficient documentation

## 2013-06-05 DIAGNOSIS — I82403 Acute embolism and thrombosis of unspecified deep veins of lower extremity, bilateral: Secondary | ICD-10-CM

## 2013-06-05 DIAGNOSIS — Q269 Congenital malformation of great vein, unspecified: Secondary | ICD-10-CM

## 2013-06-05 DIAGNOSIS — I2699 Other pulmonary embolism without acute cor pulmonale: Secondary | ICD-10-CM | POA: Insufficient documentation

## 2013-06-05 DIAGNOSIS — I871 Compression of vein: Secondary | ICD-10-CM | POA: Insufficient documentation

## 2013-06-05 DIAGNOSIS — Q268 Other congenital malformations of great veins: Secondary | ICD-10-CM | POA: Insufficient documentation

## 2013-06-05 LAB — PROTIME-INR: INR: 1.42 (ref 0.00–1.49)

## 2013-06-05 MED ORDER — RIVAROXABAN 20 MG PO TABS: 20.0000 mg | ORAL_TABLET | Freq: Every day | ORAL | Status: DC

## 2013-06-05 NOTE — Patient Instructions (Signed)
.  [facility] Discharge Instructions  RECOMMENDATIONS MADE BY THE CONSULTANT AND ANY TEST RESULTS WILL BE SENT TO YOUR REFERRING PHYSICIAN.  EXAM FINDINGS BY THE PHYSICIAN TODAY AND SIGNS OR SYMPTOMS TO REPORT TO CLINIC OR PRIMARY PHYSICIAN: Exam per Elijah Birk.   MEDICATIONS PRESCRIBED:  Continue coumadin. We will look in to getting xarelto for you.  INSTRUCTIONS GIVEN AND DISCUSSED: Lab in one week  SPECIAL INSTRUCTIONS/FOLLOW-UP: Call Angie to work on getting new drug  Thank you for choosing [facility] to provide your oncology and hematology care.  To afford each patient quality time with our providers, please arrive at least 15 minutes before your scheduled appointment time.  With your help, our goal is to use those 15 minutes to complete the necessary work-up to ensure our physicians have the information they need to help with your evaluation and healthcare recommendations.    Effective January 1st, 2014, we ask that you re-schedule your appointment with our physicians should you arrive 10 or more minutes late for your appointment.  We strive to give you quality time with our providers, and arriving late affects you and other patients whose appointments are after yours.    Again, thank you for choosing [facility].  Our hope is that these requests will decrease the amount of time that you wait before being seen by our physicians.       _____________________________________________________________  Should you have questions after your visit to [facility], please contact our office at [phone] between the hours of 8:30 a.m. and 5:00 p.m.  Voicemails left after 4:30 p.m. will not be returned until the following business day.  For prescription refill requests, have your pharmacy contact our office with your prescription refill request.

## 2013-06-05 NOTE — Progress Notes (Signed)
Labs drawn today for pt 

## 2013-06-05 NOTE — Progress Notes (Addendum)
Physical Therapy - Wound Therapy  Treatment   Patient Details  Name: Matthew Wise MRN: [id number] Date of Birth: Feb 03, 1961  Today's Date: 06/05/2013 Time: 0940-1010 Time Calculation (min): 30 min Charges: Selective debridement (= or < 20 cm)   Visit#: 5 of 8  Re-eval: 06/14/13  Subjective Subjective Assessment Subjective: Pt states that he has kept wound covered. He took off his bandages this morning.   Pain Assessment Pain Assessment Pain Assessment: No/denies pain  Wound Therapy Wound 12/05/12 Other (Comment) Leg Posterior;Medial;Right draining (Active)  Site / Wound Assessment Yellow;Red 06/05/2013 11:48 AM  % Wound base Red or Granulating 90% 06/05/2013 11:48 AM  % Wound base Yellow 10% 06/05/2013 11:48 AM  % Wound base Black 0% 06/05/2013 11:48 AM  % Wound base Other (Comment) 0% 06/05/2013 11:48 AM  Peri-wound Assessment Edema;Induration 06/05/2013 11:48 AM  Wound Length (cm) 2 cm 05/17/2013 11:54 AM  Wound Width (cm) 4 cm 05/17/2013 11:54 AM  Wound Depth (cm) 0.3 cm 05/17/2013 11:54 AM  Undermining (cm) 12:00 on clock undermining  .3cm; 3:00 on clock undermining .6 cm;  9:00 on clock undermining .2 cm 12/08/2012  9:28 AM  Margins Attached edges (approximated) 01/04/2013 11:44 AM  Closure None 06/05/2013 11:48 AM  Drainage Amount Minimal 06/05/2013 11:48 AM  Drainage Description Serous 06/05/2013 11:48 AM  Non-staged Wound Description Not applicable 03/22/2013 10:07 AM  Treatment Cleansed;Debridement (Selective) 06/05/2013 11:48 AM  Dressing Type Hydrocolloid 06/05/2013 11:48 AM  Dressing Changed Changed 06/05/2013 11:48 AM  Dressing Status Clean;Dry;Intact 06/01/2013  3:00 PM     Wound 02/15/13 Other (Comment) Leg Left;Anterior;Medial Large area distal to small area on L medial shin (Active)  Site / Wound Assessment Pale;Pink;Yellow 06/05/2013 11:48 AM  % Wound base Red or Granulating 85% 06/05/2013 11:48 AM  % Wound base Yellow 15% 06/05/2013 11:48 AM  % Wound base Black 0% 06/05/2013 11:48 AM  %  Wound base Other (Comment) 0% 06/05/2013 11:48 AM  Peri-wound Assessment Edema;Induration 06/05/2013 11:48 AM  Wound Length (cm) 3.5 cm 05/17/2013 11:54 AM  Wound Width (cm) 2.5 cm 05/17/2013 11:54 AM  Wound Depth (cm) 0.3 cm 05/17/2013 11:54 AM  Drainage Amount Other (Comment) 03/22/2013 10:07 AM  Drainage Description Other (Comment) 03/22/2013 10:07 AM  Non-staged Wound Description Not applicable 03/22/2013 10:07 AM  Treatment Cleansed;Debridement (Selective) 06/05/2013 11:48 AM  Dressing Type Impregnated gauze (bismuth) 06/05/2013 11:48 AM  Dressing Changed Changed 06/05/2013 11:48 AM  Dressing Status Clean;Dry;Intact 06/01/2013  3:00 PM     Wound 05/17/13 Other (Comment) Left;Posterior;Medial (Active)  Site / Wound Assessment Red 06/05/2013 11:48 AM  % Wound base Red or Granulating 95% 06/05/2013 11:48 AM  % Wound base Yellow 5% 06/05/2013 11:48 AM  % Wound base Black 0% 06/05/2013 11:48 AM  % Wound base Other (Comment) 0% 06/05/2013 11:48 AM  Peri-wound Assessment Edema;Induration 06/05/2013 11:48 AM  Wound Length (cm) 2.5 cm 05/17/2013 11:54 AM  Wound Width (cm) 2 cm 05/17/2013 11:54 AM  Wound Depth (cm) 0.2 cm 05/17/2013 11:54 AM  Closure None 06/05/2013 11:48 AM  Drainage Amount Scant 06/05/2013 11:48 AM  Drainage Description Serous 06/05/2013 11:48 AM  Treatment Cleansed;Debridement (Selective) 06/05/2013 11:48 AM  Dressing Type Impregnated gauze (bismuth) 06/05/2013 11:48 AM  Dressing Changed Changed 06/05/2013 11:48 AM  Dressing Status Clean;Dry;Intact 06/01/2013  3:00 PM   Selective Debridement Selective Debridement - Location: wound bed on all wounds Bilateral LE's Selective Debridement - Tools Used: Forceps Selective Debridement - Tissue Removed: slough and dry skin  Physical Therapy Assessment and Plan Wound Therapy - Assess/Plan/Recommendations Wound Therapy - Clinical Statement: Wound appear to be progressing well. RLE wound presetns with increased granlation. WOudns are withotu signs or sx of infection. Pt  toelrates debridement well. BLE were moisturized with lotion. Juxatalite bandages applied over bandages. Wound Plan: Continue wound care per PT POC.      Goals    Problem List Patient Active Problem List   Diagnosis Date Noted  . 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 Leg wound, left 08/19/2011  . DVT (deep venous thrombosis) 06/15/2011  . H/O PE (pulmonary embolism) 06/15/2011  . 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

## 2013-06-08 ENCOUNTER — Ambulatory Visit (HOSPITAL_COMMUNITY)
Admission: RE | Admit: 2013-06-08 | Discharge: 2013-06-08 | Disposition: A | Discharge status: Home / Self Care | Payer: Medicaid Other | Source: Ambulatory Visit | Attending: Internal Medicine | Admitting: Internal Medicine

## 2013-06-08 NOTE — Progress Notes (Addendum)
Physical Therapy - Wound Therapy  Treatment   Patient Details  Name: Matthew Wise MRN: [id number] Date of Birth: 08-16-1961  Today's Date: 06/08/2013 Time: 4098-1191 Time Calculation (min): 30 min Charges: Selective debridement (= or < 20 cm)   Visit#: 6 of 8  Re-eval: 06/14/13  Subjective Subjective Assessment Subjective: Pt states that he took dressings off yesterday.  Pain Assessment Pain Assessment Pain Assessment: No/denies pain  Wound Therapy Wound 12/05/12 Other (Comment) Leg Posterior;Medial;Right draining (Active)  Site / Wound Assessment Yellow;Red 06/08/2013 12:12 PM  % Wound base Red or Granulating 90% 06/08/2013 12:12 PM  % Wound base Yellow 10% 06/08/2013 12:12 PM  % Wound base Black 0% 06/08/2013 12:12 PM  % Wound base Other (Comment) 0% 06/08/2013 12:12 PM  Peri-wound Assessment Edema;Induration 06/08/2013 12:12 PM  Wound Length (cm) 2 cm 05/17/2013 11:54 AM  Wound Width (cm) 4 cm 05/17/2013 11:54 AM  Wound Depth (cm) 0.3 cm 05/17/2013 11:54 AM  Undermining (cm) 12:00 on clock undermining  .3cm; 3:00 on clock undermining .6 cm;  9:00 on clock undermining .2 cm 12/08/2012  9:28 AM  Margins Attached edges (approximated) 01/04/2013 11:44 AM  Closure None 06/08/2013 12:12 PM  Drainage Amount Minimal 06/08/2013 12:12 PM  Drainage Description Serous 06/08/2013 12:12 PM  Non-staged Wound Description Not applicable 03/22/2013 10:07 AM  Treatment Cleansed;Debridement (Selective) 06/08/2013 12:12 PM  Dressing Type Hydrocolloid 06/08/2013 12:12 PM  Dressing Changed Changed 06/05/2013 11:48 AM  Dressing Status Clean;Dry;Intact 06/01/2013  3:00 PM     Wound 02/15/13 Other (Comment) Leg Left;Anterior;Medial Large area distal to small area on L medial shin (Active)  Site / Wound Assessment Pale;Pink;Yellow 06/08/2013 12:12 PM  % Wound base Red or Granulating 85% 06/08/2013 12:12 PM  % Wound base Yellow 15% 06/08/2013 12:12 PM  % Wound base Black 0% 06/08/2013 12:12 PM  % Wound base Other  (Comment) 0% 06/08/2013 12:12 PM  Peri-wound Assessment Edema;Induration 06/08/2013 12:12 PM  Wound Length (cm) 3.5 cm 05/17/2013 11:54 AM  Wound Width (cm) 2.5 cm 05/17/2013 11:54 AM  Wound Depth (cm) 0.3 cm 05/17/2013 11:54 AM  Drainage Amount Other (Comment) 03/22/2013 10:07 AM  Drainage Description Other (Comment) 03/22/2013 10:07 AM  Non-staged Wound Description Not applicable 03/22/2013 10:07 AM  Treatment Cleansed 06/08/2013 12:12 PM  Dressing Type Impregnated gauze (bismuth) 06/08/2013 12:12 PM  Dressing Changed Changed 06/08/2013 12:12 PM  Dressing Status Clean;Dry;Intact 06/01/2013  3:00 PM     Wound 05/17/13 Other (Comment) Left;Posterior;Medial (Active)  Site / Wound Assessment Red 06/08/2013 12:12 PM  % Wound base Red or Granulating 95% 06/08/2013 12:12 PM  % Wound base Yellow 5% 06/08/2013 12:12 PM  % Wound base Black 0% 06/08/2013 12:12 PM  % Wound base Other (Comment) 0% 06/08/2013 12:12 PM  Peri-wound Assessment Edema;Induration 06/08/2013 12:12 PM  Wound Length (cm) 2.5 cm 05/17/2013 11:54 AM  Wound Width (cm) 2 cm 05/17/2013 11:54 AM  Wound Depth (cm) 0.2 cm 05/17/2013 11:54 AM  Closure None 06/08/2013 12:12 PM  Drainage Amount Scant 06/08/2013 12:12 PM  Drainage Description Serous 06/08/2013 12:12 PM  Treatment Cleansed;Debridement (Selective) 06/05/2013 11:48 AM  Dressing Type Impregnated gauze (bismuth) 06/08/2013 12:12 PM  Dressing Changed Changed 06/05/2013 11:48 AM  Dressing Status Clean;Dry;Intact 06/01/2013  3:00 PM   Selective Debridement Selective Debridement - Location: wound bed on all wounds Bilateral LE's Selective Debridement - Tools Used: Forceps Selective Debridement - Tissue Removed: slough and dry skin   Physical Therapy Assessment and Plan Wound Therapy - Assess/Plan/Recommendations  Wound Therapy - Clinical Statement: Wounds continue to progress well. Wounds appear healthier overall and are without signs or sx of infection. Pt tolerates debridement well. BLE were  moisturized with lotion. Juxatalite bandages applied over bandages. Wound Plan: Continue wound care per PT POC.  Problem List Patient Active Problem List   Diagnosis Date Noted  . 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 Leg wound, left 08/19/2011  . DVT (deep venous thrombosis) 06/15/2011  . H/O PE (pulmonary embolism) 06/15/2011  . 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

## 2013-06-12 ENCOUNTER — Ambulatory Visit (HOSPITAL_COMMUNITY): Payer: Self-pay | Admitting: *Deleted

## 2013-06-13 ENCOUNTER — Encounter (HOSPITAL_BASED_OUTPATIENT_CLINIC_OR_DEPARTMENT_OTHER): Payer: Medicaid Other

## 2013-06-13 ENCOUNTER — Other Ambulatory Visit (HOSPITAL_COMMUNITY): Payer: Self-pay | Admitting: Oncology

## 2013-06-13 DIAGNOSIS — I82409 Acute embolism and thrombosis of unspecified deep veins of unspecified lower extremity: Secondary | ICD-10-CM

## 2013-06-13 NOTE — Progress Notes (Signed)
Matthew Wise's reason for visit today are for labs as scheduled per MD orders.  Venipuncture performed with a 23 gauge butterfly needle to L Antecubital.  Matthew Wise tolerated venipuncture well and without incident; questions were answered and patient was discharged.    Taking coumadin 10mg  alternating with 12.5mg .

## 2013-06-14 ENCOUNTER — Encounter (HOSPITAL_COMMUNITY): Payer: Self-pay | Admitting: *Deleted

## 2013-06-14 ENCOUNTER — Emergency Department (HOSPITAL_COMMUNITY)
Admission: EM | Admit: 2013-06-14 | Discharge: 2013-06-14 | Disposition: A | Discharge status: Home / Self Care | Payer: Medicaid Other | Attending: Emergency Medicine | Admitting: Emergency Medicine

## 2013-06-14 DIAGNOSIS — Z8739 Personal history of other diseases of the musculoskeletal system and connective tissue: Secondary | ICD-10-CM | POA: Insufficient documentation

## 2013-06-14 DIAGNOSIS — Z87798 Personal history of other (corrected) congenital malformations: Secondary | ICD-10-CM | POA: Insufficient documentation

## 2013-06-14 DIAGNOSIS — Z7901 Long term (current) use of anticoagulants: Secondary | ICD-10-CM | POA: Insufficient documentation

## 2013-06-14 DIAGNOSIS — F17200 Nicotine dependence, unspecified, uncomplicated: Secondary | ICD-10-CM | POA: Insufficient documentation

## 2013-06-14 DIAGNOSIS — Z86718 Personal history of other venous thrombosis and embolism: Secondary | ICD-10-CM | POA: Insufficient documentation

## 2013-06-14 DIAGNOSIS — M5432 Sciatica, left side: Secondary | ICD-10-CM

## 2013-06-14 DIAGNOSIS — M5430 Sciatica, unspecified side: Secondary | ICD-10-CM | POA: Insufficient documentation

## 2013-06-14 DIAGNOSIS — Z86711 Personal history of pulmonary embolism: Secondary | ICD-10-CM | POA: Insufficient documentation

## 2013-06-14 MED ORDER — OXYCODONE-ACETAMINOPHEN 5-325 MG PO TABS: 1.0000 | ORAL_TABLET | ORAL | Status: DC | PRN

## 2013-06-14 MED ORDER — OXYCODONE-ACETAMINOPHEN 5-325 MG PO TABS
2.0000 | ORAL_TABLET | Freq: Once | ORAL | Status: AC
  Administered 2013-06-14: 2 via ORAL
  Filled 2013-06-14: qty 2

## 2013-06-14 MED ORDER — CYCLOBENZAPRINE HCL 10 MG PO TABS: 10.0000 mg | ORAL_TABLET | Freq: Three times a day (TID) | ORAL | Status: DC | PRN

## 2013-06-14 NOTE — ED Provider Notes (Signed)
History    CSN: [id number] Arrival date & time 06/14/13  1242  First MD Initiated Contact with Patient 06/14/13 1250     Chief Complaint  Patient presents with  . Back Pain   (Consider location/radiation/quality/duration/timing/severity/associated sxs/prior Treatment) Patient is a 52 y.o. male presenting with back pain. The history is provided by the patient.  Back Pain Location:  Lumbar spine Quality:  Aching and burning Radiates to:  L posterior upper leg and L knee Pain severity:  Moderate Pain is:  Same all the time Onset quality:  Gradual Duration:  3 days Timing:  Constant Progression:  Unchanged Chronicity:  New Context: not falling, not lifting heavy objects, not recent illness and not twisting   Relieved by:  Bed rest Worsened by:  Ambulation, bending, standing and twisting Ineffective treatments:  None tried Associated symptoms: leg pain   Associated symptoms: no abdominal pain, no abdominal swelling, no bladder incontinence, no bowel incontinence, no chest pain, no dysuria, no fever, no headaches, no numbness, no paresthesias, no pelvic pain, no perianal numbness, no tingling and no weakness   Risk factors: no recent surgery   Risk factors comment:  Hx of previous DVT's and PE, pt currently taking coumadin  Past Medical History  Diagnosis Date  . DVT of leg (deep venous thrombosis)     rt. leg  . Inferior vena caval thrombosis     Chronic  . DVT (deep venous thrombosis) 06/15/2011  . H/O PE (pulmonary embolism) 06/15/2011  . 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 No pertinent past surgical history. Family History  Problem Relation Age of Onset  . COPD Father   . Stroke Brother   . Cancer Brother    History  Substance Use Topics  . Smoking status: Current Every Day Smoker -- 1.00 packs/day for 30 years    Types: Cigarettes  . Smokeless tobacco: Never Used  . Alcohol Use: 14.4 oz/week    24 Cans of beer per week     Review of Systems  Constitutional: Negative for fever.  Respiratory: Negative for shortness of breath.   Cardiovascular: Negative for chest pain.  Gastrointestinal: Negative for vomiting, abdominal pain, constipation and bowel incontinence.  Genitourinary: Negative for bladder incontinence, dysuria, hematuria, flank pain, decreased urine volume, difficulty urinating and pelvic pain.       No perineal numbness or incontinence of urine or feces  Musculoskeletal: Positive for back pain. Negative for joint swelling.  Skin: Negative for rash.  Neurological: Negative for tingling, weakness, numbness, headaches and paresthesias.  All other systems reviewed and are negative.    Allergies  Review of patient's allergies indicates no known allergies.  Home Medications   Current Outpatient Rx  Name  Route  Sig  Dispense  Refill  . oxycodone (OXY-IR) 5 MG capsule   Oral   Take 10 mg by mouth every 4 (four) hours as needed for pain.         Marland Kitchen warfarin (COUMADIN) 5 MG tablet   Oral   Take 10-12.5 mg by mouth daily. Taking 12.5 mg alternating with 10 mg daily.. Pt had 10 mg dose yesterday          BP 143/89  Pulse 84  Temp(Src) 98 F (36.7 C) (Oral)  Resp 20  Ht 6' (1.829 m)  Wt 285 lb (129.275 kg)  BMI 38.64 kg/m2  SpO2 100% Physical Exam  Nursing note and vitals reviewed. Constitutional: He is oriented to person, place, and time.  He appears well-developed and well-nourished. No distress.  HENT:  Head: Normocephalic and atraumatic.  Neck: Normal range of motion. Neck supple.  Cardiovascular: Normal rate, regular rhythm, normal heart sounds and intact distal pulses.   No murmur heard. Pulmonary/Chest: Effort normal and breath sounds normal. No respiratory distress.  Musculoskeletal: He exhibits tenderness. He exhibits no edema.       Lumbar back: He exhibits tenderness and pain. He exhibits normal range of motion, no swelling, no deformity, no laceration and normal pulse.   ttp of the left lumbar paraspinal muscles and SI joint.  No spinal tenderness.  DP pulses are brisk and symmetrical.  Distal sensation intact.  Hip Flexors/Extensors are intact.  Pain reproduced with SLR on left.  No calf pain.  Chronic edema of the bilateral LE's.    Neurological: He is alert and oriented to person, place, and time. No cranial nerve deficit or sensory deficit. He exhibits normal muscle tone. Coordination and gait normal.  Reflex Scores:      Patellar reflexes are 2+ on the right side and 2+ on the left side.      Achilles reflexes are 2+ on the right side and 2+ on the left side. Skin: Skin is warm and dry.    ED Course  Procedures (including critical care time) Labs Reviewed - No data to display   MDM    Patient has left sided low back and SI joint pain x 3 days.  No spinal tenderness, no tachycardia, tachypnea or dyspnea.    Pain appears typical of sciatica.  INR was checked yesterday and pt reports that his doctor is going to "change my medication dose".  Edema of bilateral LE's that's chronic, pt denies increased edema, erythema or pain to the calf.  Doubt  emergent neurological or infectious process.  Pt agrees to rest, ice and close f/u with his doctor for recheck if the sx's are not improving  Jlyn Cerros L. Trisha Mangle, PA-C 06/15/13 2152

## 2013-06-14 NOTE — ED Notes (Signed)
Lower back pain with pain radiating down left leg x 3 days. NAD.

## 2013-06-14 NOTE — ED Notes (Signed)
Lt low back pain with radiation down lt leg, x 3 days.No known injury

## 2013-06-15 ENCOUNTER — Ambulatory Visit (HOSPITAL_COMMUNITY): Payer: Self-pay | Admitting: *Deleted

## 2013-06-18 NOTE — ED Provider Notes (Signed)
Medical screening examination/treatment/procedure(s) were performed by non-physician practitioner and as supervising physician I was immediately available for consultation/collaboration.  Charles B. Bernette Mayers, MD 06/18/13 1654

## 2013-06-20 ENCOUNTER — Encounter (HOSPITAL_BASED_OUTPATIENT_CLINIC_OR_DEPARTMENT_OTHER): Payer: Medicaid Other

## 2013-06-20 DIAGNOSIS — I82409 Acute embolism and thrombosis of unspecified deep veins of unspecified lower extremity: Secondary | ICD-10-CM

## 2013-06-20 LAB — PROTIME-INR
INR: 1.99 — ABNORMAL HIGH (ref 0.00–1.49)
Prothrombin Time: 22 seconds — ABNORMAL HIGH (ref 11.6–15.2)

## 2013-06-20 NOTE — Progress Notes (Signed)
Erroneous encounter

## 2013-06-20 NOTE — Progress Notes (Signed)
Labs drawn today for pt 

## 2013-06-21 ENCOUNTER — Ambulatory Visit (HOSPITAL_COMMUNITY)
Admission: RE | Admit: 2013-06-21 | Discharge: 2013-06-21 | Disposition: A | Discharge status: Home / Self Care | Payer: Medicaid Other | Source: Ambulatory Visit | Attending: Internal Medicine | Admitting: Internal Medicine

## 2013-06-21 NOTE — Progress Notes (Signed)
Physical Therapy - Wound Therapy Re-evaluation  Re-evaluation   Patient Details  Name: Matthew Wise MRN: [id number] Date of Birth: 05/05/1961  Today's Date: 06/21/2013 Time: 1600-1630 Time Calculation (min): 30 min  Visit#: 7 of 15  Re-eval: 07/19/13 Charges: deb <20cm  Subjective Subjective Assessment Subjective: PT states he has not shown for his last cpl appts due to back pain that he had to go to the ED over.  States he has not been wearing his compression garments due to "loosing" his stockings that he wears under his wraps.  Pt. reports he will go to [facility] and get more.  Supplied pt with several stockinettes until he can get new ones.  Pain Assessment Pain Assessment Pain Assessment: No/denies pain  Wound Therapy Wound 12/05/12 Other (Comment) Leg Posterior;Medial;Right draining (Active)  Site / Wound Assessment Yellow;Red 06/21/2013  4:35 PM  % Wound base Red or Granulating 90% 06/21/2013  4:35 PM  % Wound base Yellow 10% 06/21/2013  4:35 PM  % Wound base Black 0% 06/21/2013  4:35 PM  % Wound base Other (Comment) 0% 06/21/2013  4:35 PM  Peri-wound Assessment Edema;Induration 06/21/2013  4:35 PM  Wound Length (cm) 1.5 cm (was 2) 06/21/2013  4:35 PM  Wound Width (cm) 4 cm (was 4 ) 06/21/2013  4:35 PM  Wound Depth (cm) 0.2 cm (was 0.3) 06/21/2013  4:35 PM  Undermining (cm) none 12/08/2012  9:28 AM  Margins Attached edges (approximated) 01/04/2013 11:44 AM  Closure None 06/21/2013  4:35 PM  Drainage Amount Minimal 06/21/2013  4:35 PM  Drainage Description Serous 06/21/2013  4:35 PM  Non-staged Wound Description Not applicable 03/22/2013 10:07 AM  Treatment Cleansed;Debridement (Selective) 06/21/2013  4:35 PM  Dressing Type Hydrocolloid medihoney 06/21/2013  4:35 PM  Dressing Changed Changed 06/21/2013  4:35 PM  Dressing Status Clean;Dry;Intact 06/21/2013  4:35 PM       Physical Therapy Assessment and Plan Wound Therapy - Assess/Plan/Recommendations Wound Therapy - Clinical  Statement: All wounds on LLE now healed.  Only one wounds remain on RLE located posteriorly.  Vinegar/water bath done to distal LE's to increase PH/improve skin integrity.  Lotion applied to LE's afterward.  Pt encouraged to perform vinegar bath at home to decrease fungal lesions that increase wounds.    Pt has partly met all goals (met for Lt LE/ not met for Rt LE) Pt will need further woundcare for Rt LE wound Wound Plan: Continue wound care per PT POC 3 week X 4 more weeks.      Goals Wound Therapy Goals - Improve the function of patient's integumentary system by progressing the wound(s) through the phases of wound healing by: 1.Decrease Necrotic Tissue to: 0% - Progress: Partly met 2. Increase Granulation Tissue to: 100%- Progress: Partly met 3. Decrease Length/Width/Depth by (cm): 1x.7x.5- Progress: Partly met 4.  Improve Drainage Characteristics to scant- Progress: Partly met 5.  Patient will be able to  verbalize the importance of wearing compression stockings daily and correctly- Progress: Partly met 6. Patient will verbalize the importance of moisturizing daily - Progress: Partly met   Problem List Patient Active Problem List   Diagnosis Date Noted  . 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 Leg wound, left 08/19/2011  . DVT (deep venous thrombosis) 06/15/2011  . H/O PE (pulmonary embolism) 06/15/2011  . 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  Please sign and return making a copy for your files.  You may hard copy or send electronically.  Please check one: ___1.  Approve of this plan  ___2.  Approve of this plan with the following changes.   ____________________________                             _____________ Physician                                                                       Date

## 2013-06-23 ENCOUNTER — Ambulatory Visit (HOSPITAL_COMMUNITY)
Admission: RE | Admit: 2013-06-23 | Discharge: 2013-06-23 | Disposition: A | Discharge status: Home / Self Care | Payer: Medicaid Other | Source: Ambulatory Visit | Attending: Internal Medicine | Admitting: Internal Medicine

## 2013-06-23 NOTE — Progress Notes (Signed)
Physical Therapy - Wound Therapy  Treatment   Patient Details  Name: Matthew Wise MRN: [id number] Date of Birth: 10-Dec-1960  Today's Date: 06/23/2013 Time: 0800-0826 Time Calculation (min): 26 min Visit#: 8 of 15  Re-eval: 07/19/13 Charges:  Deb<20cm  Subjective Subjective Assessment Subjective: Pt came to clinic with Rt LE juxtafit in place, however no dressing on wound beneath.  No compression on Lt LE.  Pain Assessment Pain Assessment Pain Assessment: No/denies pain  Wound Therapy Wound 12/05/12 Other (Comment) Leg Posterior;Medial;Right draining (Active)  Site / Wound Assessment Yellow;Red 06/23/2013  8:31 AM  % Wound base Red or Granulating 90% 06/23/2013  8:31 AM  % Wound base Yellow 10% 06/23/2013  8:31 AM  % Wound base Black 0% 06/23/2013  8:31 AM  % Wound base Other (Comment) 0% 06/23/2013  8:31 AM  Peri-wound Assessment Edema;Induration 06/23/2013  8:31 AM  Wound Length (cm) 1.5 cm 06/21/2013  4:35 PM  Wound Width (cm) 4 cm 06/21/2013  4:35 PM  Wound Depth (cm) 0.2 cm 06/21/2013  4:35 PM  Undermining (cm) 12:00 on clock undermining  .3cm; 3:00 on clock undermining .6 cm;  9:00 on clock undermining .2 cm 12/08/2012  9:28 AM  Margins Attached edges (approximated) 01/04/2013 11:44 AM  Closure None 06/23/2013  8:31 AM  Drainage Amount Minimal 06/23/2013  8:31 AM  Drainage Description Serous 06/23/2013  8:31 AM  Non-staged Wound Description Not applicable 03/22/2013 10:07 AM  Treatment Cleansed;Debridement (Selective) 06/23/2013  8:31 AM  Dressing Type Hydrocolloid 06/23/2013  8:31 AM  Dressing Changed Changed 06/23/2013  8:31 AM  Dressing Status Clean;Dry;Intact 06/23/2013  8:31 AM       Physical Therapy Assessment and Plan Wound Therapy - Assess/Plan/Recommendations Wound Therapy - Clinical Statement: Continued improvement in overall skin integrity with using vinegar bath.  No signs/symptoms of infection or skin breakdown perimeter of Bilateral LE's.  Rt posterior LE wound only  wound that remains.  Wound is responding well to medihoney hydrocolloid. Wound Plan: Continue wound care per PT POC.       Problem List Patient Active Problem List   Diagnosis Date Noted  . 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 Leg wound, left 08/19/2011  . DVT (deep venous thrombosis) 06/15/2011  . H/O PE (pulmonary embolism) 06/15/2011  . 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

## 2013-06-27 ENCOUNTER — Ambulatory Visit (HOSPITAL_COMMUNITY)
Admission: RE | Admit: 2013-06-27 | Discharge: 2013-06-27 | Disposition: A | Discharge status: Home / Self Care | Payer: Medicaid Other | Source: Ambulatory Visit | Attending: Internal Medicine | Admitting: Internal Medicine

## 2013-06-27 NOTE — Progress Notes (Signed)
Physical Therapy - Wound Therapy  Treatment   Patient Details  Name: SERGE MAIN MRN: [id number] Date of Birth: [birth year]  Today's Date: 06/27/2013 Time: 6295-2841 Time Calculation (min): 30 min Charges: Selective debridement (= or < 20 cm)   Visit#: 9 of 15  Re-eval: 07/19/13  Subjective Subjective Assessment Subjective: Pt states that LLE had no drainage over the weekend.  Pain Assessment Pain Assessment Pain Assessment: No/denies pain  Wound Therapy Wound 12/05/12 Other (Comment) Leg Posterior;Medial;Right draining (Active)  Site / Wound Assessment Yellow;Red 06/27/2013  1:42 PM  % Wound base Red or Granulating 95% 06/27/2013  1:42 PM  % Wound base Yellow 5% 06/27/2013  1:42 PM  % Wound base Black 0% 06/27/2013  1:42 PM  % Wound base Other (Comment) 0% 06/27/2013  1:42 PM  Peri-wound Assessment Edema;Induration 06/27/2013  1:42 PM  Wound Length (cm) 1.5 cm 06/21/2013  4:35 PM  Wound Width (cm) 4 cm 06/21/2013  4:35 PM  Wound Depth (cm) 0.2 cm 06/21/2013  4:35 PM  Undermining (cm) 12:00 on clock undermining  .3cm; 3:00 on clock undermining .6 cm;  9:00 on clock undermining .2 cm 12/08/2012  9:28 AM  Margins Attached edges (approximated) 01/04/2013 11:44 AM  Closure None 06/27/2013  1:42 PM  Drainage Amount Minimal 06/27/2013  1:42 PM  Drainage Description Serous 06/27/2013  1:42 PM  Non-staged Wound Description Not applicable 03/22/2013 10:07 AM  Treatment Cleansed;Debridement (Selective) 06/27/2013  1:42 PM  Dressing Type Hydrocolloid 06/27/2013  1:42 PM  Dressing Changed Changed 06/27/2013  1:42 PM  Dressing Status Clean;Dry;Intact 06/27/2013  1:42 PM   Selective Debridement Selective Debridement - Location: wound bed and periwound of wound on RLE Selective Debridement - Tools Used: Forceps Selective Debridement - Tissue Removed: slough and dry skin   Physical Therapy Assessment and Plan Wound Therapy - Assess/Plan/Recommendations Wound Therapy - Clinical Statement: Skin  integrity continues to improve. Increased granulation noted in RLE wound. Wound is without signs or sx of infection.  Applied vinegar bath and lotion to BLEs. Compression bandages donned at end of session.  Wound Plan: Continue wound care per PT POC.  Problem List Patient Active Problem List   Diagnosis Date Noted  . 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 Leg wound, left 08/19/2011  . DVT (deep venous thrombosis) 06/15/2011  . H/O PE (pulmonary embolism) 06/15/2011  . 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

## 2013-06-29 ENCOUNTER — Telehealth (HOSPITAL_COMMUNITY): Payer: Self-pay

## 2013-06-29 ENCOUNTER — Ambulatory Visit (HOSPITAL_COMMUNITY): Payer: Self-pay

## 2013-06-29 ENCOUNTER — Other Ambulatory Visit (HOSPITAL_COMMUNITY): Payer: Self-pay

## 2013-07-03 ENCOUNTER — Ambulatory Visit (HOSPITAL_COMMUNITY)
Admission: RE | Admit: 2013-07-03 | Discharge: 2013-07-03 | Disposition: A | Discharge status: Home / Self Care | Payer: Medicaid Other | Source: Ambulatory Visit | Attending: Internal Medicine | Admitting: Internal Medicine

## 2013-07-03 DIAGNOSIS — IMO0001 Reserved for inherently not codable concepts without codable children: Secondary | ICD-10-CM | POA: Insufficient documentation

## 2013-07-03 DIAGNOSIS — S81009A Unspecified open wound, unspecified knee, initial encounter: Secondary | ICD-10-CM | POA: Insufficient documentation

## 2013-07-03 NOTE — Progress Notes (Signed)
Physical Therapy - Wound Therapy  Treatment   Patient Details  Name: Matthew Wise MRN: [id number] Date of Birth: [birth year]  Today's Date: 07/03/2013 Time: 4098-1191 Time Calculation (min): 25 min Visit#: 10 of 15  Re-eval: 07/19/13 Charges:  Reece Levy < 20cm  Subjective Subjective Assessment Subjective: Pt states he's been applying vinegar to skin and moisturizing; compliant with compression garments and wearing them today.   Wound Therapy Wound 12/05/12 Other (Comment) Leg Posterior;Medial;Right draining (Active)  Site / Wound Assessment Yellow;Red 07/03/2013 10:07 AM  % Wound base Red or Granulating 95% 07/03/2013 10:07 AM  % Wound base Yellow 5% 07/03/2013 10:07 AM  % Wound base Black 0% 07/03/2013 10:07 AM  % Wound base Other (Comment) 0% 07/03/2013 10:07 AM  Peri-wound Assessment Edema;Induration 07/03/2013 10:07 AM  Wound Length (cm) 1.5 cm 06/21/2013  4:35 PM  Wound Width (cm) 4 cm 06/21/2013  4:35 PM  Wound Depth (cm) 0.2 cm 06/21/2013  4:35 PM  Undermining (cm) 12:00 on clock undermining  .3cm; 3:00 on clock undermining .6 cm;  9:00 on clock undermining .2 cm 12/08/2012  9:28 AM  Margins Attached edges (approximated) 01/04/2013 11:44 AM  Closure None 07/03/2013 10:07 AM  Drainage Amount Minimal 07/03/2013 10:07 AM  Drainage Description Serous 07/03/2013 10:07 AM  Non-staged Wound Description Not applicable 03/22/2013 10:07 AM  Treatment Cleansed;Debridement (Selective) 07/03/2013 10:07 AM  Dressing Type Impregnated gauze (bismuth) 07/03/2013 10:07 AM  Dressing Changed Changed 07/03/2013 10:07 AM  Dressing Status Clean;Dry;Intact 07/03/2013 10:07 AM   Selective Debridement Selective Debridement - Location: wound bed and periwound of wound on RLE Selective Debridement - Tools Used: Forceps Selective Debridement - Tissue Removed: slough and dry skin   Physical Therapy Assessment and Plan Wound Therapy - Assess/Plan/Recommendations Wound Therapy - Clinical Statement: Much improved skin integrity of  perimeter of wound with overall decreasing size and improved approximation of edges.  Applied vinegar to broken skin surrounding wound.  Pt applied compression garments independently following treatment.   Wound Plan: Continue wound care per PT POC.       Problem List Patient Active Problem List   Diagnosis Date Noted  . 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 Leg wound, left 08/19/2011  . DVT (deep venous thrombosis) 06/15/2011  . H/O PE (pulmonary embolism) 06/15/2011  . 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

## 2013-07-06 ENCOUNTER — Other Ambulatory Visit (HOSPITAL_COMMUNITY): Payer: Self-pay

## 2013-07-07 ENCOUNTER — Encounter (HOSPITAL_COMMUNITY): Payer: Medicaid Other | Attending: Oncology

## 2013-07-07 ENCOUNTER — Other Ambulatory Visit (HOSPITAL_COMMUNITY): Payer: Self-pay | Admitting: Oncology

## 2013-07-07 DIAGNOSIS — I82409 Acute embolism and thrombosis of unspecified deep veins of unspecified lower extremity: Secondary | ICD-10-CM

## 2013-07-07 LAB — PROTIME-INR: INR: 2.67 — ABNORMAL HIGH (ref 0.00–1.49)

## 2013-07-07 MED ORDER — OXYCODONE HCL 5 MG PO CAPS: 10.0000 mg | ORAL_CAPSULE | ORAL | Status: DC | PRN

## 2013-07-07 NOTE — Progress Notes (Signed)
Labs drawn today for pt 

## 2013-07-10 ENCOUNTER — Ambulatory Visit (HOSPITAL_COMMUNITY): Payer: Self-pay | Admitting: Physical Therapy

## 2013-07-17 ENCOUNTER — Ambulatory Visit (HOSPITAL_COMMUNITY)
Admission: RE | Admit: 2013-07-17 | Discharge: 2013-07-17 | Disposition: A | Discharge status: Home / Self Care | Payer: Medicaid Other | Source: Ambulatory Visit | Attending: Internal Medicine | Admitting: Internal Medicine

## 2013-07-17 NOTE — Progress Notes (Signed)
Physical Therapy - Wound Therapy  Treatment   Patient Details  Name: DELMAS FAUCETT MRN: [id number] Date of Birth: 08-Aug-1961  Today's Date: 07/17/2013 Time: 4098-1191 Time Calculation (min): 23 min Charges: Selective debridement (= or < 20 cm)   Visit#: 11 of 15  Re-eval: 07/19/13  Subjective Subjective Assessment Subjective: Pt reports continued compliance with compression garment and proper care for BLEs at home.   Pain Assessment Pain Assessment Pain Assessment: No/denies pain  Wound Therapy Wound 12/05/12 Other (Comment) Leg Posterior;Medial;Right draining (Active)  Site / Wound Assessment Yellow;Red 07/17/2013 10:55 AM  % Wound base Red or Granulating 95% 07/17/2013 10:55 AM  % Wound base Yellow 5% 07/17/2013 10:55 AM  % Wound base Black 0% 07/17/2013 10:55 AM  % Wound base Other (Comment) 0% 07/17/2013 10:55 AM  Peri-wound Assessment Edema;Induration 07/17/2013 10:55 AM  Wound Length (cm) 1.5 cm 06/21/2013  4:35 PM  Wound Width (cm) 4 cm 06/21/2013  4:35 PM  Wound Depth (cm) 0.2 cm 06/21/2013  4:35 PM  Undermining (cm) 12:00 on clock undermining  .3cm; 3:00 on clock undermining .6 cm;  9:00 on clock undermining .2 cm 12/08/2012  9:28 AM  Margins Attached edges (approximated) 01/04/2013 11:44 AM  Closure None 07/17/2013 10:55 AM  Drainage Amount Minimal 07/17/2013 10:55 AM  Drainage Description Serous 07/17/2013 10:55 AM  Non-staged Wound Description Not applicable 03/22/2013 10:07 AM  Treatment Cleansed;Debridement (Selective) 07/17/2013 10:55 AM  Dressing Type Impregnated gauze (bismuth) 07/17/2013 10:55 AM  Dressing Changed Changed 07/03/2013 10:07 AM  Dressing Status Clean;Dry;Intact 07/17/2013 10:55 AM   Selective Debridement Selective Debridement - Location: wound bed and periwound of wound on RLE Selective Debridement - Tools Used: Forceps Selective Debridement - Tissue Removed: slough and dry skin   Physical Therapy Assessment and Plan Wound Therapy -  Assess/Plan/Recommendations Wound Therapy - Clinical Statement: Skin integrity throughout RLE continues to improve. Wound appears to be approximating well. Vinegar/water bath applied to RLE followed by lotion. Advised pt to purchase more breathable bandage for wound when he has to change dressing at home. Bandage that he has been using is non-breathable. Overall wound is progressing well. Wound Plan: Reassess next session.      Goals    Problem List Patient Active Problem List   Diagnosis Date Noted  . 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 Leg wound, left 08/19/2011  . DVT (deep venous thrombosis) 06/15/2011  . H/O PE (pulmonary embolism) 06/15/2011  . 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

## 2013-07-21 ENCOUNTER — Encounter (HOSPITAL_BASED_OUTPATIENT_CLINIC_OR_DEPARTMENT_OTHER): Payer: Medicaid Other

## 2013-07-21 ENCOUNTER — Other Ambulatory Visit (HOSPITAL_COMMUNITY): Payer: Self-pay | Admitting: Oncology

## 2013-07-21 DIAGNOSIS — I82409 Acute embolism and thrombosis of unspecified deep veins of unspecified lower extremity: Secondary | ICD-10-CM

## 2013-07-21 LAB — PROTIME-INR: Prothrombin Time: 18.2 seconds — ABNORMAL HIGH (ref 11.6–15.2)

## 2013-07-21 NOTE — Progress Notes (Signed)
Labs drawn today for pt 

## 2013-07-24 ENCOUNTER — Ambulatory Visit (HOSPITAL_COMMUNITY): Payer: Self-pay | Admitting: Physical Therapy

## 2013-07-28 ENCOUNTER — Encounter (HOSPITAL_COMMUNITY): Payer: Medicaid Other

## 2013-07-28 DIAGNOSIS — I82409 Acute embolism and thrombosis of unspecified deep veins of unspecified lower extremity: Secondary | ICD-10-CM

## 2013-07-28 LAB — PROTIME-INR: INR: 2.37 — ABNORMAL HIGH (ref 0.00–1.49)

## 2013-07-28 NOTE — Progress Notes (Signed)
Labs drawn today for pt 

## 2013-08-07 ENCOUNTER — Other Ambulatory Visit (HOSPITAL_COMMUNITY): Payer: Self-pay

## 2013-08-08 ENCOUNTER — Encounter (HOSPITAL_COMMUNITY): Payer: Medicaid Other | Attending: Oncology

## 2013-08-08 ENCOUNTER — Other Ambulatory Visit (HOSPITAL_COMMUNITY): Payer: Self-pay | Admitting: Oncology

## 2013-08-08 DIAGNOSIS — I82409 Acute embolism and thrombosis of unspecified deep veins of unspecified lower extremity: Secondary | ICD-10-CM | POA: Insufficient documentation

## 2013-08-08 NOTE — Progress Notes (Signed)
Labs drawn today for pt 

## 2013-08-14 ENCOUNTER — Encounter (HOSPITAL_BASED_OUTPATIENT_CLINIC_OR_DEPARTMENT_OTHER): Payer: Medicaid Other

## 2013-08-14 DIAGNOSIS — I82409 Acute embolism and thrombosis of unspecified deep veins of unspecified lower extremity: Secondary | ICD-10-CM

## 2013-08-14 LAB — PROTIME-INR
INR: 1.55 — ABNORMAL HIGH (ref 0.00–1.49)
Prothrombin Time: 18.2 seconds — ABNORMAL HIGH (ref 11.6–15.2)

## 2013-08-14 NOTE — Progress Notes (Signed)
Labs drawn today for pt 

## 2013-08-15 ENCOUNTER — Other Ambulatory Visit (HOSPITAL_COMMUNITY): Payer: Self-pay

## 2013-08-23 ENCOUNTER — Other Ambulatory Visit (HOSPITAL_COMMUNITY): Payer: Self-pay

## 2013-08-31 ENCOUNTER — Other Ambulatory Visit (HOSPITAL_COMMUNITY): Payer: Self-pay | Admitting: Oncology

## 2013-08-31 DIAGNOSIS — I871 Compression of vein: Secondary | ICD-10-CM

## 2013-08-31 DIAGNOSIS — Q269 Congenital malformation of great vein, unspecified: Secondary | ICD-10-CM

## 2013-08-31 DIAGNOSIS — I82403 Acute embolism and thrombosis of unspecified deep veins of lower extremity, bilateral: Secondary | ICD-10-CM

## 2013-08-31 DIAGNOSIS — I2699 Other pulmonary embolism without acute cor pulmonale: Secondary | ICD-10-CM

## 2013-08-31 MED ORDER — WARFARIN SODIUM 5 MG PO TABS: ORAL_TABLET | ORAL | Status: DC

## 2013-09-05 ENCOUNTER — Ambulatory Visit (HOSPITAL_COMMUNITY): Payer: Self-pay | Admitting: Oncology

## 2013-09-05 NOTE — Progress Notes (Signed)
-  No show, letter sent-   

## 2013-09-21 ENCOUNTER — Other Ambulatory Visit (HOSPITAL_COMMUNITY): Payer: Self-pay | Admitting: Oncology

## 2013-09-21 ENCOUNTER — Encounter (HOSPITAL_COMMUNITY): Payer: Medicaid Other | Attending: Oncology

## 2013-09-21 DIAGNOSIS — I82409 Acute embolism and thrombosis of unspecified deep veins of unspecified lower extremity: Secondary | ICD-10-CM

## 2013-09-21 LAB — PROTIME-INR: Prothrombin Time: 20.5 seconds — ABNORMAL HIGH (ref 11.6–15.2)

## 2013-09-21 NOTE — Progress Notes (Signed)
Labs drawn today for pt 

## 2013-10-01 NOTE — Progress Notes (Signed)
[name]., MD [address] [place] [address]  DVT (deep venous thrombosis), unspecified laterality - Plan: CBC with Differential, Basic metabolic panel, naproxen (NAPROSYN) 500 MG tablet, oxycodone (OXY-IR) 5 MG capsule  H/O PE (pulmonary embolism)  Congenital inferior vena cava interruption  CURRENT THERAPY: Anticoagulation with Coumadin 12.5 mg alternating with 10 mg.  INTERVAL HISTORY: MAGIC MOHLER 52 y.o. 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 Patient seen by Dr. Clemetine Marker at [facility] who found that he also has sickle cell trait which increases the patient's risk of DVTs. He did not have cardiolipin antibodies, nor beta-2 glycoprotein antibodies. It is believed by both, Dr. Mikel Cella and [place], that he is not a good candidate for ongoing work and is a candidate for permanent disability.  I personally reviewed and went over laboratory results with the patient.  His INR today is therapeutic.  CBC and metabolic panel are unimpressive.   Lab Results  Component Value Date   INR 2.61* 10/02/2013   INR 1.82* 09/21/2013   INR 1.55* 08/14/2013   Dangelo admits that he is not completely compliant with his Coumadin.  He misses doses from time to time.  He was educated on the importance of compliance with this medication.    On our last encounter we discussed a switch to Xarelto.  He was provided with a $10 co-pay card.  Additionally, the risks, benefits, alternatives, and side effects of Xarelto were discussed including GI bleed, Intracranial bleed, lack of reversal mechanism of the medication, and we discussed healthy living behaviors including wearing a seat belt and avoiding contact sports.  With this information, he has  decided against Xarelto, particularly due to recent advertisement for suing people regarding the medication.   He denies any complaints today.  He denies any bleeding.  He reports that his friend gave him Naproxen for pain and this was effective for him.  Therefore, I will give him an Rx for this medication.  Additionally, he needs a refill on his Oxy-IR.  Hematologically, he denies any complaints and ROS questioning is negative.  Smoking cessation education provided today.  He reports that his disability was approved which is wonderful for him as he is a great candidate for this service due to his inability to be on his feet for long periods of time secondary to his veinous damage for DVTs.   Past Medical History  Diagnosis Date  . DVT of leg (deep venous thrombosis)     rt. leg  . Inferior vena caval thrombosis     Chronic  . DVT (deep venous thrombosis) 06/15/2011  . H/O PE (pulmonary embolism) 06/15/2011  . 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     has No Known Allergies.  Mr. Smolinsky had no medications administered during this visit.  History reviewed. No pertinent past surgical history.  Denies any headaches, dizziness, double vision, fevers, chills, night sweats, nausea, vomiting, diarrhea, constipation, chest pain, heart palpitations, shortness of breath, blood in stool, black tarry stool, urinary pain, urinary burning, urinary frequency, hematuria.  PHYSICAL EXAMINATION  ECOG PERFORMANCE STATUS: 1 - Symptomatic but completely ambulatory  Filed Vitals:   10/02/13 1000  BP: 150/89  Pulse: 66  Temp: 97.8 F (36.6 C)  Resp: 20    GENERAL:alert, no distress, well nourished, well developed, comfortable, cooperative and smiling SKIN:  skin color, texture, turgor are normal, no rashes or significant lesions HEAD: Normocephalic, No masses, lesions, tenderness or abnormalities EYES: normal, PERRLA, EOMI, Conjunctiva are pink and non-injected EARS: External ears normal OROPHARYNX:mucous membranes are moist  NECK: supple, no adenopathy, thyroid normal size, non-tender, without nodularity, no stridor, non-tender, trachea midline LYMPH:  no palpable lymphadenopathy BREAST:not examined LUNGS: clear to auscultation and percussion HEART: regular rate & rhythm, no murmurs, no gallops, S1 normal and S2 normal ABDOMEN:abdomen soft, non-tender, obese, normal bowel sounds, no masses or organomegaly and no hepatosplenomegaly BACK: Back symmetric, no curvature., No CVA tenderness EXTREMITIES:less then 2 second capillary refill, no joint deformities, effusion, or inflammation, no skin discoloration, no cyanosis, positive findings:  Woody infiltration of B/L legs, with compression stockings in place.  NEURO: alert & oriented x 3 with fluent speech, no focal motor/sensory deficits, gait normal   LABORATORY DATA: CBC    Component Value Date/Time   WBC 5.0 10/02/2013 1012   RBC 5.29 10/02/2013 1012   RBC 5.46 12/10/2008 0600   HGB 16.2 10/02/2013 1012   HCT 46.3 10/02/2013 1012   PLT 203 10/02/2013 1012   MCV 87.5 10/02/2013 1012   MCH 30.6 10/02/2013 1012   MCHC 35.0 10/02/2013 1012   RDW 14.1 10/02/2013 1012   LYMPHSABS 1.9 10/02/2013 1012   MONOABS 0.4 10/02/2013 1012   EOSABS 0.1 10/02/2013 1012   BASOSABS 0.0 10/02/2013 1012    Lab Results  Component Value Date   INR 2.61* 10/02/2013   INR 1.82* 09/21/2013   INR 1.55* 08/14/2013      ASSESSMENT:  1. 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 Patient seen by Dr. Clemetine Marker at [facility] who found that he  also has sickle cell trait which increases the patient's risk of DVTs. He did not have cardiolipin antibodies, nor beta-2 glycoprotein antibodies. It is believed by both, Dr. Mikel Cella and [place], that he is not a good candidate for ongoing work and is a candidate for permanent disability.  2. Poor compliance with Coumadin and PT/INR appointments  3. Poor candidate for ongoing work  4. Candidate for permanent disability  5. Continued Tobacco abuse  Patient Active Problem List   Diagnosis Date Noted  . 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 Leg wound, left 08/19/2011  . DVT (deep venous thrombosis) 06/15/2011  . H/O PE (pulmonary embolism) 06/15/2011  . 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 I personally reviewed and went over laboratory results with the patient. 2. Labs today: CBC diff, BMET, INR 3. Encouraged patient compliance with Coumadin 4. Re-addressed the option of Xarelto and patient as declined due to risks, benefits, alternatives, and side effects. 5. Rx for naproxen BID with meals 6. Rx for Oxy-IR #45 7. INR in 1 week 8. Smoking cessation education provided today.  9. Return in 3 months for follow-up.   THERAPY PLAN:  The key with Kaine is compliance with his Coumadin.  He has decided  not to pursue Xarelto therapy with the fear of increased bleeding, ads on television, and the lack of a reversal mechanism for the medication.  All questions were answered. The patient knows to call the clinic with any problems, questions or concerns. We can certainly see the patient much sooner if necessary.  Patient and plan discussed with Dr. Alla German and he is in agreement with the aforementioned.   Ibraham Levi

## 2013-10-02 ENCOUNTER — Encounter (HOSPITAL_BASED_OUTPATIENT_CLINIC_OR_DEPARTMENT_OTHER): Payer: Medicaid Other | Admitting: Oncology

## 2013-10-02 ENCOUNTER — Encounter (HOSPITAL_COMMUNITY): Payer: Self-pay | Admitting: Oncology

## 2013-10-02 ENCOUNTER — Encounter (HOSPITAL_COMMUNITY): Payer: Medicaid Other | Attending: Oncology

## 2013-10-02 VITALS — BP 150/89 | HR 66 | Temp 97.8°F | Resp 20 | Wt 286.5 lb

## 2013-10-02 DIAGNOSIS — Z86711 Personal history of pulmonary embolism: Secondary | ICD-10-CM

## 2013-10-02 DIAGNOSIS — I82409 Acute embolism and thrombosis of unspecified deep veins of unspecified lower extremity: Secondary | ICD-10-CM

## 2013-10-02 DIAGNOSIS — Q269 Congenital malformation of great vein, unspecified: Secondary | ICD-10-CM

## 2013-10-02 DIAGNOSIS — Z86718 Personal history of other venous thrombosis and embolism: Secondary | ICD-10-CM

## 2013-10-02 DIAGNOSIS — Z7901 Long term (current) use of anticoagulants: Secondary | ICD-10-CM

## 2013-10-02 DIAGNOSIS — D6859 Other primary thrombophilia: Secondary | ICD-10-CM

## 2013-10-02 DIAGNOSIS — F17200 Nicotine dependence, unspecified, uncomplicated: Secondary | ICD-10-CM

## 2013-10-02 DIAGNOSIS — I2699 Other pulmonary embolism without acute cor pulmonale: Secondary | ICD-10-CM

## 2013-10-02 LAB — BASIC METABOLIC PANEL
BUN: 11 mg/dL (ref 6–23)
CO2: 29 mEq/L (ref 19–32)
Calcium: 9.7 mg/dL (ref 8.4–10.5)
Chloride: 99 mEq/L (ref 96–112)
Creatinine, Ser: 1.11 mg/dL (ref 0.50–1.35)
GFR calc Af Amer: 88 mL/min — ABNORMAL LOW (ref >90)

## 2013-10-02 LAB — CBC WITH DIFFERENTIAL/PLATELET
Basophils Absolute: 0 10*3/uL (ref 0.0–0.1)
Basophils Relative: 0 % (ref 0–1)
Eosinophils Relative: 1 % (ref 0–5)
HCT: 46.3 % (ref 39.0–52.0)
MCHC: 35 g/dL (ref 30.0–36.0)
MCV: 87.5 fL (ref 78.0–100.0)
Monocytes Absolute: 0.4 10*3/uL (ref 0.1–1.0)
Platelets: 203 10*3/uL (ref 150–400)
RDW: 14.1 % (ref 11.5–15.5)
WBC: 5 10*3/uL (ref 4.0–10.5)

## 2013-10-02 LAB — PROTIME-INR
INR: 2.61 — ABNORMAL HIGH (ref 0.00–1.49)
Prothrombin Time: 27 seconds — ABNORMAL HIGH (ref 11.6–15.2)

## 2013-10-02 MED ORDER — OXYCODONE HCL 5 MG PO CAPS: 10.0000 mg | ORAL_CAPSULE | ORAL | Status: DC | PRN

## 2013-10-02 MED ORDER — NAPROXEN 500 MG PO TABS: 500.0000 mg | ORAL_TABLET | Freq: Two times a day (BID) | ORAL | Status: DC

## 2013-10-02 NOTE — Patient Instructions (Signed)
[facility] Discharge Instructions  RECOMMENDATIONS MADE BY THE CONSULTANT AND ANY TEST RESULTS WILL BE SENT TO YOUR REFERRING PHYSICIAN.  Continue same dose Coumadin 12.5mg  alternating with 10mg . PT/INR in 1 week. A prescription was sent to your pharmacy for naproxen. You were given a Daesean Lazarz prescription for Oxy-IR. Return to see physician in 3 months.  Thank you for choosing [facility] to provide your oncology and hematology care.  To afford each patient quality time with our providers, please arrive at least 15 minutes before your scheduled appointment time.  With your help, our goal is to use those 15 minutes to complete the necessary work-up to ensure our physicians have the information they need to help with your evaluation and healthcare recommendations.    Effective January 1st, 2014, we ask that you re-schedule your appointment with our physicians should you arrive 10 or more minutes late for your appointment.  We strive to give you quality time with our providers, and arriving late affects you and other patients whose appointments are after yours.    Again, thank you for choosing [facility].  Our hope is that these requests will decrease the amount of time that you wait before being seen by our physicians.       _____________________________________________________________  Should you have questions after your visit to [facility], please contact our office at [phone] between the hours of 8:30 a.m. and 5:00 p.m.  Voicemails left after 4:30 p.m. will not be returned until the following business day.  For prescription refill requests, have your pharmacy contact our office with your prescription refill request.

## 2013-10-02 NOTE — Progress Notes (Signed)
Labs drawn today for cbc/diff,bmp,pt

## 2013-10-10 ENCOUNTER — Other Ambulatory Visit (HOSPITAL_COMMUNITY): Payer: Self-pay

## 2013-10-11 ENCOUNTER — Encounter (HOSPITAL_BASED_OUTPATIENT_CLINIC_OR_DEPARTMENT_OTHER): Payer: Medicaid Other

## 2013-10-11 DIAGNOSIS — I82409 Acute embolism and thrombosis of unspecified deep veins of unspecified lower extremity: Secondary | ICD-10-CM

## 2013-10-11 LAB — PROTIME-INR: Prothrombin Time: 25.9 seconds — ABNORMAL HIGH (ref 11.6–15.2)

## 2013-10-11 NOTE — Progress Notes (Signed)
Labs drawn today for pt 

## 2013-10-11 NOTE — Addendum Note (Signed)
Addended by: Vergia Alcon on: 10/11/2013 01:49 PM   Modules accepted: Orders

## 2013-10-23 ENCOUNTER — Encounter (HOSPITAL_BASED_OUTPATIENT_CLINIC_OR_DEPARTMENT_OTHER): Payer: Medicaid Other

## 2013-10-23 DIAGNOSIS — I82409 Acute embolism and thrombosis of unspecified deep veins of unspecified lower extremity: Secondary | ICD-10-CM

## 2013-10-23 NOTE — Progress Notes (Signed)
Labs drawn today for pt 

## 2013-10-30 ENCOUNTER — Other Ambulatory Visit (HOSPITAL_COMMUNITY): Payer: Self-pay

## 2013-12-14 ENCOUNTER — Telehealth (HOSPITAL_COMMUNITY): Payer: Self-pay

## 2013-12-14 ENCOUNTER — Other Ambulatory Visit (HOSPITAL_COMMUNITY): Payer: Self-pay | Admitting: Oncology

## 2013-12-14 NOTE — Telephone Encounter (Signed)
Message left for patient to call office for appointment to get PT/INR done.  Coumadin not to be filled until scheduled and done.  Call back requested.

## 2013-12-15 ENCOUNTER — Other Ambulatory Visit (HOSPITAL_COMMUNITY): Payer: Self-pay | Admitting: Oncology

## 2013-12-15 ENCOUNTER — Encounter (HOSPITAL_COMMUNITY): Payer: Medicaid Other | Attending: Oncology

## 2013-12-15 DIAGNOSIS — I82409 Acute embolism and thrombosis of unspecified deep veins of unspecified lower extremity: Secondary | ICD-10-CM | POA: Insufficient documentation

## 2013-12-15 DIAGNOSIS — Q269 Congenital malformation of great vein, unspecified: Secondary | ICD-10-CM

## 2013-12-15 DIAGNOSIS — I2699 Other pulmonary embolism without acute cor pulmonale: Secondary | ICD-10-CM

## 2013-12-15 DIAGNOSIS — I871 Compression of vein: Secondary | ICD-10-CM

## 2013-12-15 LAB — PROTIME-INR
INR: 1.55 — AB (ref 0.00–1.49)
PROTHROMBIN TIME: 18.2 s — AB (ref 11.6–15.2)

## 2013-12-15 MED ORDER — WARFARIN SODIUM 5 MG PO TABS: ORAL_TABLET | ORAL | Status: DC

## 2013-12-15 MED ORDER — OXYCODONE HCL 5 MG PO CAPS: 10.0000 mg | ORAL_CAPSULE | ORAL | Status: DC | PRN

## 2013-12-15 NOTE — Progress Notes (Signed)
Labs drawn today for pt 

## 2013-12-29 ENCOUNTER — Other Ambulatory Visit (HOSPITAL_COMMUNITY): Payer: Self-pay

## 2014-01-02 ENCOUNTER — Other Ambulatory Visit (HOSPITAL_COMMUNITY): Payer: Self-pay | Admitting: Oncology

## 2014-01-02 ENCOUNTER — Telehealth (HOSPITAL_COMMUNITY): Payer: Self-pay

## 2014-01-02 ENCOUNTER — Encounter (HOSPITAL_COMMUNITY): Payer: Medicaid Other

## 2014-01-02 ENCOUNTER — Encounter (HOSPITAL_COMMUNITY): Payer: Medicaid Other | Attending: Oncology

## 2014-01-02 ENCOUNTER — Encounter (HOSPITAL_COMMUNITY): Payer: Self-pay

## 2014-01-02 VITALS — BP 142/90 | HR 72 | Resp 18 | Wt 283.9 lb

## 2014-01-02 DIAGNOSIS — D573 Sickle-cell trait: Secondary | ICD-10-CM

## 2014-01-02 DIAGNOSIS — I2699 Other pulmonary embolism without acute cor pulmonale: Secondary | ICD-10-CM

## 2014-01-02 DIAGNOSIS — I82409 Acute embolism and thrombosis of unspecified deep veins of unspecified lower extremity: Secondary | ICD-10-CM | POA: Insufficient documentation

## 2014-01-02 DIAGNOSIS — Z7901 Long term (current) use of anticoagulants: Secondary | ICD-10-CM

## 2014-01-02 LAB — D-DIMER, QUANTITATIVE: D-Dimer, Quant: 0.45 ug/mL-FEU (ref 0.00–0.48)

## 2014-01-02 LAB — CBC WITH DIFFERENTIAL/PLATELET
BASOS ABS: 0 10*3/uL (ref 0.0–0.1)
BASOS PCT: 0 % (ref 0–1)
EOS ABS: 0.1 10*3/uL (ref 0.0–0.7)
Eosinophils Relative: 1 % (ref 0–5)
HCT: 46 % (ref 39.0–52.0)
Hemoglobin: 16.1 g/dL (ref 13.0–17.0)
Lymphocytes Relative: 46 % (ref 12–46)
Lymphs Abs: 2.4 10*3/uL (ref 0.7–4.0)
MCH: 30.8 pg (ref 26.0–34.0)
MCHC: 35 g/dL (ref 30.0–36.0)
MCV: 88.1 fL (ref 78.0–100.0)
Monocytes Absolute: 0.5 10*3/uL (ref 0.1–1.0)
Monocytes Relative: 9 % (ref 3–12)
NEUTROS ABS: 2.4 10*3/uL (ref 1.7–7.7)
NEUTROS PCT: 44 % (ref 43–77)
PLATELETS: 206 10*3/uL (ref 150–400)
RBC: 5.22 MIL/uL (ref 4.22–5.81)
RDW: 13.4 % (ref 11.5–15.5)
WBC: 5.3 10*3/uL (ref 4.0–10.5)

## 2014-01-02 LAB — PROTIME-INR
INR: 1.36 (ref 0.00–1.49)
PROTHROMBIN TIME: 16.4 s — AB (ref 11.6–15.2)

## 2014-01-02 MED ORDER — RIVAROXABAN 15 MG PO TABS: 15.0000 mg | ORAL_TABLET | Freq: Two times a day (BID) | ORAL | Status: DC

## 2014-01-02 NOTE — Telephone Encounter (Signed)
Patient  instructed to increase coumadin to 12.5 mg daily and to return for next PT/INR on 01/09/14.  Verbalizes understanding.

## 2014-01-02 NOTE — Telephone Encounter (Signed)
Message copied by Mellissa Kohut on Tue Jan 02, 2014 11:05 AM ------      Message from: Farrel Gobble A      Created: Tue Jan 02, 2014 10:55 AM       Please notify Mr. Wadlow to take warfarin 12.5mg  every day.  Repeat INR in one week.  Thanks ------

## 2014-01-02 NOTE — Progress Notes (Signed)
Labs drawn today for cbc/diff,dimer,pt

## 2014-01-02 NOTE — Progress Notes (Signed)
[facility]  OFFICE PROGRESS NOTE  [name], MD [id number] [place] [address]  DIAGNOSIS: DVT (deep venous thrombosis) - Plan: D-dimer, quantitative, CBC with Differential  PE (pulmonary embolism) - Plan: D-dimer, quantitative, CBC with Differential  Sickle cell trait  Chief Complaint  Patient presents with  . DVT    follow-up     CURRENT THERAPY: Warfarin  INTERVAL HISTORY: Matthew Wise 53 y.o. 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 He denies any chest pain, shortness of breath, PND, orthopnea, or palpitations. Is also had no further swelling of both lower extremities but is comfortable on oxycodone when necessary. He denies any fever, night sweats, nausea, vomiting, diarrhea, melena, hematochezia, hematuria, or epistaxis. Appetite is good with no diarrhea, constipation, nausea, vomiting, skin rash, joint pain, headache, or seizures.  MEDICAL HISTORY: Past Medical History  Diagnosis Date  . DVT of leg (deep venous thrombosis)     rt. leg  . Inferior vena caval thrombosis     Chronic  . DVT (deep venous thrombosis) 06/15/2011  . H/O PE (pulmonary embolism) 06/15/2011  . 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    ALLERGIES:  has No Known Allergies.  MEDICATIONS: has a current medication list which includes the following prescription(s): naproxen, oxycodone, warfarin, and rivaroxaban.  SURGICAL HISTORY: History  reviewed. No pertinent past surgical history.  FAMILY HISTORY: family history includes COPD in his father; Cancer in his brother; Stroke in his brother.  SOCIAL HISTORY:  reports that he has been smoking Cigarettes.  He has a 30 pack-year smoking history. He has never used smokeless tobacco. He reports that he drinks about 14.4 ounces of alcohol per week. He reports that he does not use illicit drugs.  REVIEW OF SYSTEMS:  Other than that discussed above is noncontributory.  PHYSICAL EXAMINATION: ECOG PERFORMANCE STATUS: 1 - Symptomatic but completely ambulatory  Blood pressure 142/90, pulse 72, temperature 0 F (-17.8 C), resp. rate 18, weight 283 lb 14.4 oz (128.776 kg), SpO2 100.00%.  GENERAL:alert, no distress and comfortable SKIN: skin color, texture, turgor are normal, no rashes or significant lesions EYES: PERLA; Conjunctiva are pink and non-injected, sclera clear OROPHARYNX:no exudate, no erythema on lips, buccal mucosa, or tongue. NECK: supple, thyroid normal size, non-tender, without nodularity. No masses CHEST: Normal AP diameter with no gynecomastia. LYMPH:  no palpable lymphadenopathy in the cervical, axillary or inguinal LUNGS: clear to auscultation and percussion with normal breathing effort HEART: regular rate & rhythm and no murmurs. ABDOMEN:abdomen soft, non-tender and normal bowel sounds MUSCULOSKELETAL:no cyanosis of digits and no clubbing. Range of motion normal. Generalized lower extremity tenderness with negative Homans sign. NEURO: alert & oriented x 3 with fluent speech, no focal motor/sensory deficits   LABORATORY DATA: Infusion on 01/02/2014  Component Date Value Range Status  . WBC 01/02/2014 5.3  4.0 - 10.5 K/uL Final  . RBC 01/02/2014 5.22  4.22 - 5.81 MIL/uL Final  . Hemoglobin 01/02/2014 16.1  13.0 - 17.0 g/dL Final  . HCT 01/02/2014 46.0  39.0 -  52.0 % Final  . MCV 01/02/2014 88.1  78.0 - 100.0 fL Final  . MCH 01/02/2014 30.8  26.0 - 34.0 pg Final    . MCHC 01/02/2014 35.0  30.0 - 36.0 g/dL Final  . RDW 01/02/2014 13.4  11.5 - 15.5 % Final  . Platelets 01/02/2014 206  150 - 400 K/uL Final  . Neutrophils Relative % 01/02/2014 44  43 - 77 % Final  . Neutro Abs 01/02/2014 2.4  1.7 - 7.7 K/uL Final  . Lymphocytes Relative 01/02/2014 46  12 - 46 % Final  . Lymphs Abs 01/02/2014 2.4  0.7 - 4.0 K/uL Final  . Monocytes Relative 01/02/2014 9  3 - 12 % Final  . Monocytes Absolute 01/02/2014 0.5  0.1 - 1.0 K/uL Final  . Eosinophils Relative 01/02/2014 1  0 - 5 % Final  . Eosinophils Absolute 01/02/2014 0.1  0.0 - 0.7 K/uL Final  . Basophils Relative 01/02/2014 0  0 - 1 % Final  . Basophils Absolute 01/02/2014 0.0  0.0 - 0.1 K/uL Final  . D-Dimer, Quant 01/02/2014 0.45  0.00 - 0.48 ug/mL-FEU Final   Comment:                                 AT THE INHOUSE ESTABLISHED CUTOFF                          VALUE OF 0.48 ug/mL FEU,                          THIS ASSAY HAS BEEN DOCUMENTED                          IN THE LITERATURE TO HAVE                          A SENSITIVITY AND NEGATIVE                          PREDICTIVE VALUE OF AT LEAST                          98 TO 99%.  THE TEST RESULT                          SHOULD BE CORRELATED WITH                          AN ASSESSMENT OF THE CLINICAL                          PROBABILITY OF DVT / VTE.  Marland Kitchen Prothrombin Time 01/02/2014 16.4* 11.6 - 15.2 seconds Final  . INR 01/02/2014 1.36  0.00 - 1.49 Final  Infusion on 12/15/2013  Component Date Value Range Status  . Prothrombin Time 12/15/2013 18.2* 11.6 - 15.2 seconds Final  . INR 12/15/2013 1.55* 0.00 - 1.49 Final    PATHOLOGY: No new pathology.  Urinalysis    Component Value Date/Time   COLORURINE YELLOW 12/10/2008 [place] 12/10/2008 0743   LABSPEC 1.017 12/10/2008 0743   PHURINE 5.5 12/10/2008 0743   GLUCOSEU NEGATIVE 12/10/2008 7425  HGBUR NEGATIVE 12/10/2008 [place] 12/10/2008 0743   KETONESUR NEGATIVE  12/10/2008 0743   PROTEINUR NEGATIVE 12/10/2008 0743   UROBILINOGEN 1.0 12/10/2008 0743   NITRITE NEGATIVE 12/10/2008 0743   LEUKOCYTESUR NEGATIVE MICROSCOPIC NOT DONE ON URINES WITH NEGATIVE PROTEIN, BLOOD, LEUKOCYTES, NITRITE, OR GLUCOSE <1000 mg/dL. 12/10/2008 0743    RADIOGRAPHIC STUDIES: No results found.  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 Patient seen by Dr. Christoper Allegra at [facility] who found that he also has sickle cell trait which increases the patient's risk of DVTs. He did not have cardiolipin antibodies, nor beta-2 glycoprotein antibodies. It is believed by both, Dr. Mare Ferrari and [place], that he is not a good candidate for ongoing work and is a candidate for permanent disability. #2. Willing to switch to Xarelto therapy but currently has no coverage.  #3. Continued tobacco use.    PLAN:  #1. Warfarin 10 mg alternating with 12.5 milligrams daily depending upon today's INR. #2. Will attempt to secure Xarelto from the manufacturer. #3. Office visit 3 months with INR determinations depending upon availability of Xarelto.   All questions were answered. The patient knows to call the clinic with any problems, questions or concerns. We can certainly see the patient much sooner if necessary.   I spent 25 minutes counseling the patient face to face. The total time spent in the appointment was 30 minutes.    Doroteo Bradford, MD 01/02/2014 10:58 AM

## 2014-01-02 NOTE — Patient Instructions (Signed)
[facility] Discharge Instructions  RECOMMENDATIONS MADE BY THE CONSULTANT AND ANY TEST RESULTS WILL BE SENT TO YOUR REFERRING PHYSICIAN.  EXAM FINDINGS BY THE PHYSICIAN TODAY AND SIGNS OR SYMPTOMS TO REPORT TO CLINIC OR PRIMARY PHYSICIAN: Exam and findings as discussed by Dr. Barnet Glasgow.  Will see if we can get xarelto for you.  Will let you know what to do with your coumadin dosage and when to come back for next INR once we get your results from today's labs.  MEDICATIONS PRESCRIBED:  none  INSTRUCTIONS/FOLLOW-UP: Follow-up with MD in 3 months.  Thank you for choosing [facility] to provide your oncology and hematology care.  To afford each patient quality time with our providers, please arrive at least 15 minutes before your scheduled appointment time.  With your help, our goal is to use those 15 minutes to complete the necessary work-up to ensure our physicians have the information they need to help with your evaluation and healthcare recommendations.    Effective January 1st, 2014, we ask that you re-schedule your appointment with our physicians should you arrive 10 or more minutes late for your appointment.  We strive to give you quality time with our providers, and arriving late affects you and other patients whose appointments are after yours.    Again, thank you for choosing [facility].  Our hope is that these requests will decrease the amount of time that you wait before being seen by our physicians.       _____________________________________________________________  Should you have questions after your visit to [facility], please contact our office at [phone] between the hours of 8:30 a.m. and 5:00 p.m.  Voicemails left after 4:30 p.m. will not be returned until the following business day.  For prescription refill requests, have your pharmacy contact our office with your prescription refill request.

## 2014-01-09 ENCOUNTER — Other Ambulatory Visit (HOSPITAL_COMMUNITY): Payer: Self-pay

## 2014-01-31 ENCOUNTER — Encounter (HOSPITAL_COMMUNITY): Payer: Self-pay | Attending: Oncology

## 2014-01-31 ENCOUNTER — Other Ambulatory Visit (HOSPITAL_COMMUNITY): Payer: Self-pay | Admitting: Hematology and Oncology

## 2014-01-31 ENCOUNTER — Telehealth (HOSPITAL_COMMUNITY): Payer: Self-pay | Admitting: *Deleted

## 2014-01-31 DIAGNOSIS — I82409 Acute embolism and thrombosis of unspecified deep veins of unspecified lower extremity: Secondary | ICD-10-CM | POA: Insufficient documentation

## 2014-01-31 LAB — PROTIME-INR
INR: 1.5 — ABNORMAL HIGH (ref 0.00–1.49)
Prothrombin Time: 17.7 seconds — ABNORMAL HIGH (ref 11.6–15.2)

## 2014-01-31 MED ORDER — OXYCODONE HCL 5 MG PO CAPS: 10.0000 mg | ORAL_CAPSULE | ORAL | Status: DC | PRN

## 2014-01-31 NOTE — Progress Notes (Signed)
Labs drawn today for pt 

## 2014-02-15 ENCOUNTER — Encounter (HOSPITAL_BASED_OUTPATIENT_CLINIC_OR_DEPARTMENT_OTHER): Payer: Self-pay

## 2014-02-15 ENCOUNTER — Other Ambulatory Visit (HOSPITAL_COMMUNITY): Payer: Self-pay | Admitting: Oncology

## 2014-02-15 DIAGNOSIS — I82409 Acute embolism and thrombosis of unspecified deep veins of unspecified lower extremity: Secondary | ICD-10-CM

## 2014-02-15 LAB — PROTIME-INR
INR: 1.78 — ABNORMAL HIGH (ref 0.00–1.49)
Prothrombin Time: 20.2 seconds — ABNORMAL HIGH (ref 11.6–15.2)

## 2014-02-15 NOTE — Progress Notes (Signed)
Labs drawn today for pt 

## 2014-03-01 ENCOUNTER — Other Ambulatory Visit (HOSPITAL_COMMUNITY): Payer: Self-pay

## 2014-03-06 ENCOUNTER — Other Ambulatory Visit (HOSPITAL_COMMUNITY): Payer: Self-pay | Admitting: Oncology

## 2014-03-06 ENCOUNTER — Encounter (HOSPITAL_COMMUNITY): Payer: Self-pay | Attending: Oncology

## 2014-03-06 DIAGNOSIS — I82409 Acute embolism and thrombosis of unspecified deep veins of unspecified lower extremity: Secondary | ICD-10-CM | POA: Insufficient documentation

## 2014-03-06 LAB — PROTIME-INR
INR: 1.58 — AB (ref 0.00–1.49)
Prothrombin Time: 18.4 seconds — ABNORMAL HIGH (ref 11.6–15.2)

## 2014-03-06 MED ORDER — OXYCODONE HCL 5 MG PO CAPS: 10.0000 mg | ORAL_CAPSULE | ORAL | Status: DC | PRN

## 2014-03-06 NOTE — Progress Notes (Signed)
Labs drawn today for pt 

## 2014-03-07 ENCOUNTER — Ambulatory Visit (HOSPITAL_COMMUNITY): Admission: RE | Admit: 2014-03-07 | Payer: Self-pay | Source: Ambulatory Visit | Admitting: Physical Therapy

## 2014-03-08 ENCOUNTER — Ambulatory Visit (HOSPITAL_COMMUNITY)
Admission: RE | Admit: 2014-03-08 | Discharge: 2014-03-08 | Disposition: A | Discharge status: Home / Self Care | Payer: Self-pay | Source: Ambulatory Visit | Attending: Hematology and Oncology | Admitting: Hematology and Oncology

## 2014-03-08 DIAGNOSIS — I872 Venous insufficiency (chronic) (peripheral): Secondary | ICD-10-CM | POA: Insufficient documentation

## 2014-03-08 DIAGNOSIS — Z7901 Long term (current) use of anticoagulants: Secondary | ICD-10-CM | POA: Insufficient documentation

## 2014-03-08 DIAGNOSIS — M25469 Effusion, unspecified knee: Secondary | ICD-10-CM | POA: Insufficient documentation

## 2014-03-08 DIAGNOSIS — I825Z9 Chronic embolism and thrombosis of unspecified deep veins of unspecified distal lower extremity: Secondary | ICD-10-CM | POA: Insufficient documentation

## 2014-03-08 DIAGNOSIS — IMO0001 Reserved for inherently not codable concepts without codable children: Secondary | ICD-10-CM | POA: Insufficient documentation

## 2014-03-08 DIAGNOSIS — L97809 Non-pressure chronic ulcer of other part of unspecified lower leg with unspecified severity: Secondary | ICD-10-CM | POA: Insufficient documentation

## 2014-03-08 NOTE — Progress Notes (Signed)
Physical Therapy - Wound Therapy  Evaluation   Patient Details  Name: Matthew Wise MRN: [id number] Date of Birth: [birth year]  Today's Date: 03/08/2014 Time: 1308 - 1400  charge:  evaluation  Visit#: 1 of 8  Re-eval: 04/07/14  Subjective Subjective Assessment Subjective: Pt states that his wound began getting worse about two months ago.  He states he has been using his compression and cleansing but he is still having significant swelling and his wounds are getting bigger. Patient and Family Stated Goals: wounds to heal Date of Onset: 01/08/14 Prior Treatments: self care.  Pt is a known Pt who does not have a hx of good compliance.  Pt was seen in January 2014 thru April when he quit coming to therapy prior to his wounds being healed.  The wounds got worse and he returned to therapy in June.  He was seen inconsistently until August when once again Mr. Palmeri quit coming prior to his wounds being healed.    Pain Assessment Pain Assessment Pain Assessment: 0-10 Pain Score: 7  Pain Type: Chronic pain Pain Location: Leg Pain Orientation: Right;Left Pain Descriptors / Indicators: Aching;Constant Patients Stated Pain Goal: 2 Pain Intervention(s):  (compression)  Wound Therapy Wound 12/05/12 Other (Comment) Leg Posterior;Medial;Right draining (Active)     Wound / Incision (Open or Dehisced) 03/08/14 Other (Comment) Leg Medial LT LE medial aspect (Active)  Dressing Type Pressure dressing 03/08/2014  2:13 PM  Dressing Changed New 03/08/2014  2:13 PM  Dressing Change Frequency Every 3 days 03/08/2014  2:13 PM  Site / Wound Assessment Dusky;Yellow;Black 03/08/2014  2:13 PM  % Wound base Red or Granulating 0% 03/08/2014  2:13 PM  % Wound base Yellow 90% 03/08/2014  2:13 PM  % Wound base Black 10% 03/08/2014  2:13 PM  Peri-wound Assessment Edema;Erythema (blanchable);Induration 03/08/2014  2:13 PM  Wound Length (cm) 5.5 cm 03/08/2014  2:13 PM  Wound Width (cm) 3.7 cm 03/08/2014  2:13 PM  Wound Depth  (cm) 0.3 cm 03/08/2014  2:13 PM  Margins Unattached edges (unapproximated) 03/08/2014  2:13 PM  Closure None 03/08/2014  2:13 PM  Drainage Amount Moderate 03/08/2014  2:13 PM  Drainage Description Purulent 03/08/2014  2:13 PM  Non-staged Wound Description Full thickness 03/08/2014  2:13 PM  Treatment Cleansed;Debridement (Selective) 03/08/2014  2:13 PM     Wound / Incision (Open or Dehisced) 03/08/14 Other (Comment) Leg Right;Posterior (Active)  Dressing Type Pressure dressing 03/08/2014  2:27 PM  Dressing Changed New 03/08/2014  2:27 PM  Dressing Status Dry 03/08/2014  2:27 PM  Site / Wound Assessment Dusky;Black;Yellow 03/08/2014  2:27 PM  % Wound base Red or Granulating 0% 03/08/2014  2:27 PM  % Wound base Yellow 80% 03/08/2014  2:27 PM  % Wound base Black 20% 03/08/2014  2:27 PM  Peri-wound Assessment Edema;Hemosiderin 03/08/2014  2:27 PM  Wound Length (cm) 6.3 cm 03/08/2014  2:27 PM  Wound Width (cm) 12 cm 03/08/2014  2:27 PM  Wound Depth (cm) 0.3 cm 03/08/2014  2:27 PM  Margins Unattached edges (unapproximated) 03/08/2014  2:27 PM  Drainage Amount Minimal 03/08/2014  2:27 PM  Treatment Cleansed;Debridement (Selective) 03/08/2014  2:27 PM   Selective Debridement Selective Debridement - Location: wound bed Selective Debridement - Tools Used: Forceps Selective Debridement - Tissue Removed: slough   Physical Therapy Assessment and Plan Wound Therapy - Assess/Plan/Recommendations Wound Therapy - Clinical Statement: Pt has increased swelling and slight induration pt has chronic venous insuffiiency which has most likely been complicated by lymphedema.  Pt will receive cleansing,debridement and profore dressing if wounds do not respond we may need to complete manual decongestive therapy.  Volume for Rt LE 5534.47 cc Lt 5246.37 CC Factors Delaying/Impairing Wound Healing: Infection - systemic/local;Vascular compromise Hydrotherapy Plan: Debridement;Dressing change;Patient/family education Wound Therapy - Frequency:  (2x  week) Wound Therapy - Follow Up Recommendations:  (OP therapy) Wound Plan: Pt to be seen for cleansing, debridement and pressure dressing.  If wounds do not respond may begin manual decongestive therapy.      Goals Wound Therapy Goals - Improve the function of patient's integumentary system by progressing the wound(s) through the phases of wound healing by: Decrease Necrotic Tissue to: 0 Decrease Necrotic Tissue - Progress: Goal set today Increase Granulation Tissue to: 100 Increase Granulation Tissue - Progress: Goal set today Decrease Length/Width/Depth by (cm): STG:  wounds decreased by 50%;   LTG wounds to be healed as the pt has had chronic hx of wounds for over a year now. Improve Drainage Characteristics:  (none) Patient/Family will be able to : vebalize the importance of getting new compression stockings and wearing every day. Time For Goal Achievement:  (4 weeks) Wound Therapy - Potential for Goals: Good  Problem List Patient Active Problem List   Diagnosis Date Noted  . 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 Leg wound, left 08/19/2011  . DVT (deep venous thrombosis) 06/15/2011  . H/O PE (pulmonary embolism) 06/15/2011  . 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 IVC obstruction 06/12/2011    GP    Leeroy Cha 03/08/2014, 2:36 PM   Your signature is required to indicate approval of the treatment plan as stated above.  Please sign and return making a copy for your files.  You may hard copy or send electronically.  Please check one: ___1.  Approve of this plan  ___2.  Approve of this plan with the following changes.   ____________________________                             _____________ Physician                                                                      Date

## 2014-03-12 ENCOUNTER — Inpatient Hospital Stay (HOSPITAL_COMMUNITY): Admission: RE | Admit: 2014-03-12 | Payer: Self-pay | Source: Ambulatory Visit | Admitting: *Deleted

## 2014-03-15 ENCOUNTER — Ambulatory Visit (HOSPITAL_COMMUNITY)
Admission: RE | Admit: 2014-03-15 | Discharge: 2014-03-15 | Disposition: A | Discharge status: Home / Self Care | Payer: Self-pay | Source: Ambulatory Visit | Attending: Internal Medicine | Admitting: Internal Medicine

## 2014-03-15 NOTE — Progress Notes (Addendum)
Physical Therapy - Wound Therapy  Treatment   Patient Details  Name: VAISHNAV DEMARTIN MRN: [id number] Date of Birth: 1960/12/28  Today's Date: 03/15/2014 Time: 8756-4332 Time Calculation (min): 45 min Charges: Selective debridement (= or < 20 cm)   Visit#: 2 of 8  Re-eval: 04/07/14  Subjective Subjective Assessment Subjective: Pt atets that wrap became uncomfortable secondary to sliding so he removed it yesterday. He kept wounds covered until this morning.  Pain Assessment Pain Assessment Pain Score: 3  (Following debridement ) Pain Location: Leg Pain Orientation: Right;Left  Wound Therapy Wound 12/05/12 Other (Comment) Leg Posterior;Medial;Right draining (Active)  Dressing Type Impregnated gauze (bismuth) 07/17/2013 10:55 AM  Dressing Changed Changed 07/03/2013 10:07 AM  Dressing Status Clean;Dry;Intact 07/17/2013 10:55 AM  Site / Wound Assessment Yellow;Red 07/17/2013 10:55 AM  % Wound base Red or Granulating 95% 07/17/2013 10:55 AM  % Wound base Yellow 5% 07/17/2013 10:55 AM  % Wound base Black 0% 07/17/2013 10:55 AM  % Wound base Other (Comment) 0% 07/17/2013 10:55 AM  Peri-wound Assessment Edema;Induration 07/17/2013 10:55 AM  Wound Length (cm) 1.5 cm 06/21/2013  4:35 PM  Wound Width (cm) 4 cm 06/21/2013  4:35 PM  Wound Depth (cm) 0.2 cm 06/21/2013  4:35 PM  Undermining (cm) 12:00 on clock undermining  .3cm; 3:00 on clock undermining .6 cm;  9:00 on clock undermining .2 cm 12/08/2012  9:28 AM  Margins Attached edges (approximated) 01/04/2013 11:44 AM  Closure None 07/17/2013 10:55 AM  Drainage Amount Minimal 07/17/2013 10:55 AM  Drainage Description Serous 07/17/2013 10:55 AM  Non-staged Wound Description Not applicable 9/51/8841 66:06 AM  Treatment Cleansed;Debridement (Selective) 07/17/2013 10:55 AM     Wound / Incision (Open or Dehisced) 03/08/14 Other (Comment) Leg Medial LT LE medial aspect (Active)  Dressing Type Silver hydrofiber 03/15/2014  2:00 PM  Dressing Changed New  03/08/2014  2:13 PM  Dressing Change Frequency Every 3 days 03/08/2014  2:13 PM  Site / Wound Assessment Dusky;Yellow;Black 03/08/2014  2:13 PM  % Wound base Red or Granulating 0% 03/08/2014  2:13 PM  % Wound base Yellow 90% 03/08/2014  2:13 PM  % Wound base Black 10% 03/08/2014  2:13 PM  Peri-wound Assessment Edema;Erythema (blanchable);Induration 03/08/2014  2:13 PM  Wound Length (cm) 5.5 cm 03/08/2014  2:13 PM  Wound Width (cm) 3.7 cm 03/08/2014  2:13 PM  Wound Depth (cm) 0.3 cm 03/08/2014  2:13 PM  Margins Unattached edges (unapproximated) 03/08/2014  2:13 PM  Closure None 03/08/2014  2:13 PM  Drainage Amount Moderate 03/08/2014  2:13 PM  Drainage Description Purulent 03/08/2014  2:13 PM  Non-staged Wound Description Full thickness 03/08/2014  2:13 PM  Treatment Cleansed;Debridement (Selective) 03/08/2014  2:13 PM     Wound / Incision (Open or Dehisced) 03/08/14 Other (Comment) Leg Right;Posterior (Active)  Dressing Type Pressure dressing 03/08/2014  2:27 PM  Dressing Changed New 03/08/2014  2:27 PM  Dressing Status Dry 03/08/2014  2:27 PM  Site / Wound Assessment Dusky;Black;Yellow 03/08/2014  2:27 PM  % Wound base Red or Granulating 0% 03/08/2014  2:27 PM  % Wound base Yellow 80% 03/08/2014  2:27 PM  % Wound base Black 20% 03/08/2014  2:27 PM  Peri-wound Assessment Edema;Hemosiderin 03/08/2014  2:27 PM  Wound Length (cm) 6.3 cm 03/08/2014  2:27 PM  Wound Width (cm) 12 cm 03/08/2014  2:27 PM  Wound Depth (cm) 0.3 cm 03/08/2014  2:27 PM  Margins Unattached edges (unapproximated) 03/08/2014  2:27 PM  Drainage Amount Minimal 03/08/2014  2:27  PM  Treatment Cleansed;Debridement (Selective) 03/08/2014  2:27 PM    Physical Therapy Assessment and Plan Wound Therapy - Assess/Plan/Recommendations Wound Therapy - Clinical Statement: Wound are dark and dusky. Significant swelling present in BLE. Odor noted in all wounds. Pt reprtos no sx of infection. Dressed all wound with silver alginate secondary to significant drainage and odor. New wound  noted superior to LLE wound on posterior calf. Wound presents with 50% granulation and 50% necrotic tissue. Pt appears to tolerate debridement well. Continued with profore dressing and urged pt to leave dressing on until next appointment (4/50/15). Advised pt to remove only a layer at a time if dressing becomes painful. Wound Plan: Continue cleansing, debridement and compression dressing.  If wounds do not respond may begin manual decongestive therapy. Take measurements for new LLE wound next session.   Problem List Patient Active Problem List   Diagnosis Date Noted  . 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 Leg wound, left 08/19/2011  . DVT (deep venous thrombosis) 06/15/2011  . H/O PE (pulmonary embolism) 06/15/2011  . 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 IVC obstruction 06/12/2011    Rachelle Hora, PTA  03/15/2014, 5:08 PM

## 2014-03-19 ENCOUNTER — Ambulatory Visit (HOSPITAL_COMMUNITY)
Admission: RE | Admit: 2014-03-19 | Discharge: 2014-03-19 | Disposition: A | Discharge status: Home / Self Care | Payer: Self-pay | Source: Ambulatory Visit | Attending: Internal Medicine | Admitting: Internal Medicine

## 2014-03-19 NOTE — Progress Notes (Signed)
Physical Therapy - Wound Therapy  Treatment   Patient Details  Name: Matthew Wise MRN: [id number] Date of Birth: 03-Jun-1961  Today's Date: 03/19/2014 Time: 1300-1350 Time Calculation (min): 50 min  Visit#: 3 of 8  Re-eval: 04/07/14  Subjective Subjective Assessment Subjective: Pt states that the wrap became uncomfortable and he removed it.  Pt states he was using his juxtafit but does not come in with it on.  Therapist questioned why he did not have it on pt states he just removed it prior to coming to therapy  Pain Assessment Pain Assessment Pain Score: 7  (with debridement.)  Wound Therapy Wound 12/05/12 Other (Comment) Leg Posterior;Medial;Right draining (Active)     Wound / Incision (Open or Dehisced) 03/08/14 Other (Comment) Leg Medial LT LE medial aspect (Active)  Dressing Type Silver hydrofiber;Compression wrap 03/19/2014  3:11 PM  Dressing Changed Changed 03/19/2014  3:11 PM  Dressing Change Frequency Every 3 days 03/19/2014  3:11 PM  Site / Wound Assessment Dusky;Yellow;Black 03/19/2014  3:11 PM  % Wound base Red or Granulating 0% 03/08/2014  2:13 PM  % Wound base Yellow 90% 03/19/2014  3:11 PM  % Wound base Black 10% 03/19/2014  3:11 PM  Peri-wound Assessment Edema;Erythema (blanchable) 03/19/2014  3:11 PM  Wound Length (cm) 5.5 cm 03/08/2014  2:13 PM  Wound Width (cm) 3.7 cm 03/08/2014  2:13 PM  Wound Depth (cm) 0.3 cm 03/08/2014  2:13 PM  Margins Unattached edges (unapproximated) 03/19/2014  3:11 PM  Closure None 03/19/2014  3:11 PM  Drainage Amount Moderate 03/19/2014  3:11 PM  Drainage Description Purulent;Green 03/19/2014  3:11 PM  Non-staged Wound Description Full thickness 03/19/2014  3:11 PM  Treatment Cleansed;Debridement (Selective) 03/19/2014  3:11 PM     Wound / Incision (Open or Dehisced) 03/08/14 Other (Comment) Leg Right;Posterior (Active)  Dressing Type Compression wrap;Silver hydrofiber 03/19/2014  3:11 PM  Dressing Changed Changed 03/19/2014  3:11 PM  Dressing  Status Clean 03/19/2014  3:11 PM  Dressing Change Frequency Monday, Wednesday, Friday 03/19/2014  3:11 PM  Site / Wound Assessment Dusky;Black;Yellow 03/19/2014  3:11 PM  % Wound base Red or Granulating 10% 03/19/2014  3:11 PM  % Wound base Yellow 80% 03/19/2014  3:11 PM  % Wound base Black 10% 03/19/2014  3:11 PM  Peri-wound Assessment Edema;Hemosiderin 03/19/2014  3:11 PM  Wound Length (cm) 6.3 cm 03/08/2014  2:27 PM  Wound Width (cm) 12 cm 03/08/2014  2:27 PM  Wound Depth (cm) 0.3 cm 03/08/2014  2:27 PM  Margins Unattached edges (unapproximated) 03/08/2014  2:27 PM  Drainage Amount Moderate 03/19/2014  3:11 PM  Treatment Debridement (Selective) 03/19/2014  3:11 PM     Wound / Incision (Open or Dehisced) 03/19/14  Leg Posterior;Left (Active)  Dressing Type Compression wrap;Silver hydrofiber 03/19/2014  3:11 PM  Dressing Changed Changed 03/19/2014  3:11 PM  Dressing Change Frequency Other (Comment) 03/19/2014  3:11 PM  Site / Wound Assessment Yellow 03/19/2014  3:11 PM  % Wound base Red or Granulating 70% 03/19/2014  3:11 PM  % Wound base Yellow 30% 03/19/2014  3:11 PM  Peri-wound Assessment Edema 03/19/2014  3:11 PM  Wound Length (cm) 4 cm 03/19/2014  3:11 PM  Wound Width (cm) 2 cm 03/19/2014  3:11 PM  Wound Depth (cm) 1 cm 03/19/2014  3:11 PM  Margins Unattached edges (unapproximated) 03/19/2014  3:11 PM  Drainage Amount Moderate 03/19/2014  3:11 PM  Drainage Description Purulent 03/19/2014  3:11 PM  Non-staged Wound Description Full thickness 03/19/2014  3:11 PM  Treatment Cleansed;Debridement (Selective) 03/19/2014  3:11 PM     Wound / Incision (Open or Dehisced) 03/19/14 Leg Left;Posterior superior to second posterior wound (Active)  Dressing Type Compression wrap;Silver hydrofiber 03/19/2014  3:11 PM  Dressing Changed Changed 03/19/2014  3:11 PM  Dressing Change Frequency Monday, Wednesday, Friday 03/19/2014  3:11 PM  Site / Wound Assessment Granulation tissue;Yellow 03/19/2014  3:11 PM  % Wound base Red or  Granulating 80% 03/19/2014  3:11 PM  % Wound base Yellow 20% 03/19/2014  3:11 PM  Peri-wound Assessment Edema 03/19/2014  3:11 PM  Wound Length (cm) 2.5 cm 03/19/2014  3:11 PM  Wound Width (cm) 2 cm 03/19/2014  3:11 PM  Wound Depth (cm) 0.1 cm 03/19/2014  3:11 PM  Margins Unattached edges (unapproximated) 03/19/2014  3:11 PM  Drainage Amount Moderate 03/19/2014  3:11 PM  Drainage Description Serosanguineous 03/19/2014  3:11 PM  Treatment Cleansed;Debridement (Selective) 03/19/2014  3:11 PM   Selective Debridement Selective Debridement - Location: entire wound bed where able pt has high pain with debridement. Selective Debridement - Tools Used: Forceps Selective Debridement - Tissue Removed: slough; eschar   Physical Therapy Assessment and Plan  Therapist spoke to patient about the need to always have juxtafit on if he needs to take the wrapping off.  Pt wounds had foul odor and slightly green tinge therapist explained to pt that he needed to contact his MD and request antibiotic.  Pt has two new wounds,( or wounds were not noticed upon initial exam) on posterior aspect of Lt LE which were debrided.  Silver hydrofiber dressing was applied to all dressings prior to profore lite.  PLan:  See if pt has acquired antibiotic wounds to be measured next treatment.         Problem List Patient Active Problem List   Diagnosis Date Noted  . 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 Leg wound, left 08/19/2011  . DVT (deep venous thrombosis) 06/15/2011  . H/O PE (pulmonary embolism) 06/15/2011  . 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

## 2014-03-21 ENCOUNTER — Other Ambulatory Visit (HOSPITAL_COMMUNITY): Payer: Self-pay

## 2014-03-22 ENCOUNTER — Ambulatory Visit (HOSPITAL_COMMUNITY)
Admission: RE | Admit: 2014-03-22 | Discharge: 2014-03-22 | Disposition: A | Discharge status: Home / Self Care | Payer: Self-pay | Source: Ambulatory Visit | Attending: Hematology and Oncology | Admitting: Hematology and Oncology

## 2014-03-22 NOTE — Progress Notes (Signed)
Physical Therapy - Wound Therapy  Treatment   Patient Details  Name: Matthew Wise MRN: [id number] Date of Birth: 10-Nov-1961  Today's Date: 03/22/2014 Time: 1350-1450 Time Calculation (min): 60 min Charge: selective debridement >20 cm  Visit#: 4 of 8  Re-eval: 04/07/14  Subjective Subjective Assessment Subjective: Pt entered dept with juxtafit on Bil LE, stated Profore slid down Lt LE and he removed Rt dressings earlier today.  Current pain scale 3/10  Pain Assessment Pain Assessment Pain Score: 3  Pain Location: Leg Pain Orientation: Right;Left  Wound Therapy  03/22/14 1540  Subjective Assessment  Subjective Pt entered dept with juxtafit on Bil LE, stated Profore slid down Lt LE and he removed Rt dressings earlier today.  Current pain scale 3/10  Wound / Incision (Open or Dehisced)  Date First Assessed: 03/19/14   Wound Type: (c)   Location: Leg  Location Orientation: Posterior;Left  Dressing Type Compression wrap;Silver hydrofiber (Profore)  Dressing Changed Changed  Dressing Change Frequency Other (Comment)  Site / Wound Assessment Dusky;Yellow  % Wound base Red or Granulating 65%  % Wound base Yellow 35%  Peri-wound Assessment Edema  Wound Length (cm) (3 small wounds anterior to posterior: W 2.5 x L 2.5x D .1 )  Wound Width (cm) (3 small wounds medial to lateral: W 2.5 x L 2.5x D .1 )  Wound Depth (cm) (3 small wounds medial to lateral: W 2.5 x L 2.5x D .1 )  Margins Unattached edges (unapproximated)  Drainage Amount Moderate  Drainage Description Purulent;Green  Non-staged Wound Description Full thickness  Treatment Cleansed;Debridement (Selective)  Wound / Incision (Open or Dehisced)  Date First Assessed: 03/19/14   Location: Leg  Location Orientation: Left;Posterior  Wound Description (Comments): superior to second posterior wound  Present on Admission: No  Dressing Type Silver hydrofiber  Dressing Changed Changed  Site / Wound Assessment Granulation  tissue;Pale;Pink  % Wound base Red or Granulating 85%  % Wound base Yellow 15%  Peri-wound Assessment Edema  Wound Length (cm) (3 small wounds anterior to posterior: W 2.5 x L 2.5x D .1 )  Wound Width (cm) (3 small wounds anterior to posterior: W 2.5 x L 2.5x D .1 )  Wound Depth (cm) (3 small wounds anterior to posterior: W 2.5 x L 2.5x D .1 )  Margins Unattached edges (unapproximated)  Drainage Amount Moderate  Drainage Description Serosanguineous  Treatment Cleansed;Debridement (Selective)  Wound / Incision (Open or Dehisced)  Date First Assessed/Time First Assessed: 03/08/14 1314   Wound Type: (c) Other (Comment)  Location: Leg  Location Orientation: Medial  Wound Description (Comments): LT LE medial aspect  Present on Admission: Yes  Dressing Type Silver hydrofiber;Compression wrap (Profore)  Dressing Changed Changed  Site / Wound Assessment Dusky;Yellow;Black  % Wound base Yellow 90%  % Wound base Black 10%  Peri-wound Assessment Edema;Erythema (blanchable)  Wound Length (cm) (3 small wounds anterior to posterior: W 2.5 x L 2.5x D .1 )  Wound Width (cm) (3 small wounds anterior to posterior: W 2.5 x L 2.5x D .1 )  Wound Depth (cm) (3 small wounds anterior to posterior: W 2.5 x L 2.5x D .1 )  Margins Unattached edges (unapproximated)  Closure None  Drainage Amount Moderate  Drainage Description Purulent;Green  Non-staged Wound Description Full thickness  Treatment Cleansed;Debridement (Selective)  Wound / Incision (Open or Dehisced)  Date First Assessed/Time First Assessed: 03/08/14 1330   Wound Type: (c) Other (Comment)  Location: Leg  Location Orientation: Right;Posterior  Present on  Admission: Yes  Dressing Type Compression wrap;Silver hydrofiber (Profore)  Dressing Changed Changed  Dressing Status Clean  Site / Wound Assessment Dusky;Black;Yellow  % Wound base Red or Granulating 10%  % Wound base Yellow 80%  % Wound base Black 10%  Peri-wound Assessment  Edema;Hemosiderin  Wound Length (cm) 5 cm  Wound Width (cm) 8.8 cm  Wound Depth (cm) (unable to assess)  Drainage Amount Moderate  Drainage Description Serosanguineous  Treatment Cleansed;Debridement (Selective)  Selective Debridement  Selective Debridement - Location Entire wound bed and periwound  Selective Debridement - Tools Used Forceps;Scalpel  Selective Debridement - Tissue Removed slough; eschar  Wound Therapy - Assess/Plan/Recommendations  Wound Therapy - Clinical Statement Pt has not gotten antibiotic, all wounds continue to have foul odor and slightly green tinge with moderate drainage.  Pt encouraged to call MD today for antibiotics.  Selective debridement complete for removal of eshar, yellow slough and dead skin perimeter of wound to promote healing.  Dressings included silver hydrofiber and profore for compression to assist with edema.  Placed medipore tape around top of dressings to help reduce the sliding at home.    Wound Plan Continue with debridement and compression wrap for edema.  F/U with antibiotic medication.    Selective Debridement Selective Debridement - Location: Entire wound bed and periwound Selective Debridement - Tools Used: Forceps;Scalpel Selective Debridement - Tissue Removed: slough; eschar   Physical Therapy Assessment and Plan Wound Therapy - Assess/Plan/Recommendations Wound Therapy - Clinical Statement: Pt has not gotten antibiotic, all wounds continue to have foul odor and slightly green tinge with moderate drainage.  Pt encouraged to call MD today for antibiotics.  Selective debridement complete for removal of eshar, yellow slough and dead skin perimeter of wound to promote healing.  Dressings included silver hydrofiber and profore for compression to assist with edema.  Placed medipore tape around top of dressings to help reduce the sliding at home.   Wound Plan: Continue with debridement and compression wrap for edema.  F/U with antibiotic  medication.      Goals    Problem List Patient Active Problem List   Diagnosis Date Noted  . 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 Leg wound, left 08/19/2011  . DVT (deep venous thrombosis) 06/15/2011  . H/O PE (pulmonary embolism) 06/15/2011  . 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

## 2014-03-26 ENCOUNTER — Ambulatory Visit (HOSPITAL_COMMUNITY)
Admission: RE | Admit: 2014-03-26 | Discharge: 2014-03-26 | Disposition: A | Discharge status: Home / Self Care | Payer: Self-pay | Source: Ambulatory Visit | Attending: Hematology and Oncology | Admitting: Hematology and Oncology

## 2014-03-26 NOTE — Progress Notes (Signed)
Physical Therapy - Wound Therapy  Treatment   Patient Details  Name: Matthew Wise MRN: [id number] Date of Birth: [birth year]  Today's Date: 03/26/2014 Time: 0630-1601 Time Calculation (min): 48 min Charge: selective debridement >20 cm  Visit#: 5 of 8  Re-eval: 04/07/14  Subjective Subjective Assessment Subjective: Pt entered dept with juxtafit on Bil LE, stated profore slide down and he removed on Sunday.  No c/o pain.  Pain Assessment Pain Assessment Pain Assessment: No/denies pain  Wound Therapy  03/26/14 1406  Subjective Assessment  Subjective Pt entered dept with juxtafit on Bil LE, stated profore slide down and he removed on Sunday.  No c/o pain.  Wound / Incision (Open or Dehisced) 03/19/14  Leg Posterior;Left  Date First Assessed: 03/19/14   Wound Type: (c)   Location: Leg  Location Orientation: Posterior;Left  Dressing Type Compression wrap;Silver hydrofiber  Dressing Changed Changed  Dressing Change Frequency Other (Comment)  Site / Wound Assessment Dusky;Yellow  % Wound base Red or Granulating 70%  % Wound base Yellow 30%  Peri-wound Assessment Edema  Margins Unattached edges (unapproximated)  Drainage Amount Moderate  Drainage Description Purulent;Green  Non-staged Wound Description Full thickness  Treatment Cleansed;Debridement (Selective)  Wound / Incision (Open or Dehisced)  Date First Assessed: 03/19/14   Location: Leg  Location Orientation: Left;Posterior  Wound Description (Comments): superior to second posterior wound  Present on Admission: No  Dressing Type Silver hydrofiber;Gauze (Comment);Compression wrap  Dressing Changed Changed  Site / Wound Assessment Granulation tissue;Pale;Pink  % Wound base Red or Granulating 85%  % Wound base Yellow 15%  Peri-wound Assessment Edema  Margins Unattached edges (unapproximated)  Drainage Amount Moderate  Drainage Description Serosanguineous  Treatment Cleansed;Debridement (Selective)  Wound / Incision  (Open or Dehisced)  Date First Assessed/Time First Assessed: 03/08/14 1314   Wound Type: (c) Other (Comment)  Location: Leg  Location Orientation: Medial  Wound Description (Comments): LT LE medial aspect  Present on Admission: Yes  Dressing Type Silver hydrofiber;Compression wrap (Profore)  Dressing Changed Changed  Site / Wound Assessment Dusky;Yellow;Black  % Wound base Yellow 90%  % Wound base Black 10%  Peri-wound Assessment Edema;Erythema (blanchable)  Margins Unattached edges (unapproximated)  Drainage Amount Moderate  Drainage Description Purulent;Green (slight green tinge)  Non-staged Wound Description Full thickness  Treatment Cleansed;Debridement (Selective)  Wound / Incision (Open or Dehisced)  Date First Assessed/Time First Assessed: 03/08/14 1330   Wound Type: (c) Other (Comment)  Location: Leg  Location Orientation: Right;Posterior  Present on Admission: Yes  Dressing Type Silver hydrofiber;Gauze (Comment);ABD;Compression wrap (Profore)  Dressing Changed Changed  Dressing Status Clean  Site / Wound Assessment Dusky;Black;Yellow  % Wound base Red or Granulating 50%  % Wound base Yellow 40%  % Wound base Black 10%  Peri-wound Assessment Edema;Hemosiderin  Drainage Amount Moderate  Drainage Description Serosanguineous  Treatment Cleansed;Debridement (Selective)  Selective Debridement  Selective Debridement - Location Entire wound bed and periwound  Selective Debridement - Tools Used Forceps;Scalpel  Selective Debridement - Tissue Removed slough; eschar  Wound Therapy - Assess/Plan/Recommendations  Wound Therapy - Clinical Statement Pt with apt tomorrow for antibiotic, all wound continue to have slight green tinge.  Selective debridement complete for removal of slough and eschar.  Continued with silver hydrofiber for drainage and profore for compression to assist with edema.  No reports of pain through session.    Wound Plan Continue with debridement and compression wrap  for edema.  F/U with antibiotic medication.    Selective Debridement Selective Debridement - Location:  Entire wound bed and periwound Selective Debridement - Tools Used: Forceps;Scalpel Selective Debridement - Tissue Removed: slough; eschar   Physical Therapy Assessment and Plan Wound Therapy - Assess/Plan/Recommendations Wound Therapy - Clinical Statement: Pt with apt tomorrow for antibiotic, all wound continue to have slight green tinge.  Selective debridement complete for removal of slough and eschar.  Continued with silver hydrofiber for drainage and profore for compression to assist with edema.  No reports of pain through session.   Wound Plan: Continue with debridement and compression wrap for edema.  F/U with antibiotic medication.      Goals    Problem List Patient Active Problem List   Diagnosis Date Noted  . 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 Leg wound, left 08/19/2011  . DVT (deep venous thrombosis) 06/15/2011  . H/O PE (pulmonary embolism) 06/15/2011  . 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

## 2014-03-27 ENCOUNTER — Other Ambulatory Visit (HOSPITAL_COMMUNITY): Payer: Self-pay | Admitting: Oncology

## 2014-03-27 ENCOUNTER — Encounter (HOSPITAL_COMMUNITY): Payer: Self-pay | Attending: Hematology and Oncology

## 2014-03-27 DIAGNOSIS — Z9889 Other specified postprocedural states: Secondary | ICD-10-CM | POA: Insufficient documentation

## 2014-03-27 DIAGNOSIS — S81802A Unspecified open wound, left lower leg, initial encounter: Secondary | ICD-10-CM

## 2014-03-27 DIAGNOSIS — I871 Compression of vein: Secondary | ICD-10-CM

## 2014-03-27 DIAGNOSIS — I82409 Acute embolism and thrombosis of unspecified deep veins of unspecified lower extremity: Secondary | ICD-10-CM | POA: Insufficient documentation

## 2014-03-27 LAB — PROTIME-INR
INR: 1.55 — ABNORMAL HIGH (ref 0.00–1.49)
PROTHROMBIN TIME: 18.2 s — AB (ref 11.6–15.2)

## 2014-03-27 LAB — D-DIMER, QUANTITATIVE (NOT AT ARMC): D DIMER QUANT: 0.49 ug{FEU}/mL — AB (ref 0.00–0.48)

## 2014-03-27 MED ORDER — SULFAMETHOXAZOLE-TRIMETHOPRIM 800-160 MG PO TABS: 1.0000 | ORAL_TABLET | Freq: Two times a day (BID) | ORAL | Status: DC

## 2014-03-27 NOTE — Progress Notes (Signed)
Labs drawn today for pt,dimer

## 2014-03-29 ENCOUNTER — Encounter (HOSPITAL_BASED_OUTPATIENT_CLINIC_OR_DEPARTMENT_OTHER): Payer: Self-pay

## 2014-03-29 ENCOUNTER — Ambulatory Visit (HOSPITAL_COMMUNITY)
Admission: RE | Admit: 2014-03-29 | Discharge: 2014-03-29 | Disposition: A | Discharge status: Home / Self Care | Payer: Self-pay | Source: Ambulatory Visit | Attending: Internal Medicine | Admitting: Internal Medicine

## 2014-03-29 DIAGNOSIS — I82409 Acute embolism and thrombosis of unspecified deep veins of unspecified lower extremity: Secondary | ICD-10-CM

## 2014-03-29 LAB — PROTIME-INR
INR: 1.96 — ABNORMAL HIGH (ref 0.00–1.49)
Prothrombin Time: 21.7 seconds — ABNORMAL HIGH (ref 11.6–15.2)

## 2014-03-29 NOTE — Progress Notes (Signed)
Physical Therapy - Wound Therapy  Treatment   Patient Details  Name: Matthew Wise MRN: [id number] Date of Birth: Aug 29, 1961  Today's Date: 03/29/2014 Time: 5176-1607 Time Calculation (min): 45 min Charge debridement Visit#: 6 of 8  Re-eval: 04/07/14  Subjective Subjective Assessment Subjective: Pt to department wtih B juxtafit on stating the profore bothered him.  Pt states he did get and start antibiotics on Tuesday.   Patient and Family Stated Goals: wounds to heal Date of Onset: 01/08/14  Pain Assessment Pain Assessment Pain Assessment: No/denies pain  Wound Therapy  minimal debridement to all wounds with slough taken off with forceps.  PT had extra cotton placed on LE to attempt to improve comfort of profore.  Wounds are not decreasing in size but are improving in granulation tissue.  Pt still has significant swelling unsure how often pt is wearing juxtafit once he takes profore off.  Wound 12/05/12 Other (Comment) Leg Posterior;Medial;Right draining (Active)  Dressing Type Impregnated gauze (bismuth) 07/17/2013 10:55 AM  Dressing Changed Changed 07/03/2013 10:07 AM  Dressing Status Clean;Dry;Intact 07/17/2013 10:55 AM  Site / Wound Assessment Yellow;Red 07/17/2013 10:55 AM  % Wound base Red or Granulating 95% 07/17/2013 10:55 AM  % Wound base Yellow 5% 07/17/2013 10:55 AM  % Wound base Black 0% 07/17/2013 10:55 AM  % Wound base Other (Comment) 0% 07/17/2013 10:55 AM  Peri-wound Assessment Edema;Induration 07/17/2013 10:55 AM  Wound Length (cm) 1.5 cm 06/21/2013  4:35 PM  Wound Width (cm) 4 cm 06/21/2013  4:35 PM  Wound Depth (cm) 0.2 cm 06/21/2013  4:35 PM  Undermining (cm) 12:00 on clock undermining  .3cm; 3:00 on clock undermining .6 cm;  9:00 on clock undermining .2 cm 12/08/2012  9:28 AM  Margins Attached edges (approximated) 01/04/2013 11:44 AM  Closure None 07/17/2013 10:55 AM  Drainage Amount Minimal 07/17/2013 10:55 AM  Drainage Description Serous 07/17/2013 10:55 AM   Non-staged Wound Description Not applicable 3/71/0626 94:85 AM  Treatment Cleansed;Debridement (Selective) 07/17/2013 10:55 AM     Wound / Incision (Open or Dehisced) 03/08/14 Other (Comment) Leg Medial LT LE medial aspect (Active)  Dressing Type Silver hydrofiber;Compression wrap 03/26/2014  2:06 PM  Dressing Changed Changed 03/26/2014  2:06 PM  Dressing Change Frequency Every 3 days 03/19/2014  3:11 PM  Site / Wound Assessment Dusky;Yellow;Black 03/26/2014  2:06 PM  % Wound base Red or Granulating 0% 03/08/2014  2:13 PM  % Wound base Yellow 90% 03/26/2014  2:06 PM  % Wound base Black 10% 03/26/2014  2:06 PM  Peri-wound Assessment Edema;Erythema (blanchable) 03/26/2014  2:06 PM  Wound Length (cm) 5.5 cm 03/08/2014  2:13 PM  Wound Width (cm) 3.7 cm 03/08/2014  2:13 PM  Wound Depth (cm) 0.3 cm 03/08/2014  2:13 PM  Margins Unattached edges (unapproximated) 03/26/2014  2:06 PM  Closure None 03/22/2014  3:40 PM  Drainage Amount Moderate 03/26/2014  2:06 PM  Drainage Description Purulent;Green 03/26/2014  2:06 PM  Non-staged Wound Description Full thickness 03/26/2014  2:06 PM  Treatment Cleansed;Debridement (Selective) 03/26/2014  2:06 PM     Wound / Incision (Open or Dehisced) 03/08/14 Other (Comment) Leg Right;Posterior (Active)  Dressing Type Compression wrap;Honeycomb 03/29/2014  5:03 PM  Dressing Changed Changed 03/29/2014  5:03 PM  Dressing Status Clean 03/29/2014  5:03 PM  Dressing Change Frequency Monday, Wednesday, Friday 03/19/2014  3:11 PM  Site / Wound Assessment Dusky;Granulation tissue 03/29/2014  5:03 PM  % Wound base Red or Granulating 60% 03/29/2014  5:03 PM  % Wound  base Yellow 40% 03/29/2014  5:03 PM  % Wound base Black 10% 03/29/2014  5:03 PM  Peri-wound Assessment Edema;Hemosiderin 03/26/2014  2:06 PM  Wound Length (cm) 5 cm 03/22/2014  3:40 PM  Wound Width (cm) 8.8 cm 03/22/2014  3:40 PM  Wound Depth (cm) 0.3 cm 03/08/2014  2:27 PM  Margins Unattached edges (unapproximated) 03/08/2014  2:27 PM   Drainage Amount Moderate 03/29/2014  5:03 PM  Drainage Description Serosanguineous 03/29/2014  5:03 PM  Treatment Cleansed;Debridement (Selective) 03/26/2014  2:06 PM     Wound / Incision (Open or Dehisced) 03/19/14  Leg Posterior;Left (Active)  Dressing Type Compression wrap;Honeycomb 03/29/2014  5:03 PM  Dressing Changed Changed 03/29/2014  5:03 PM  Dressing Change Frequency Other (Comment) 03/26/2014  2:06 PM  Site / Wound Assessment Brown;Dusky;Granulation tissue 03/29/2014  5:03 PM  % Wound base Red or Granulating 80% 03/29/2014  5:03 PM  % Wound base Yellow 20% 03/29/2014  5:03 PM  Peri-wound Assessment Edema 03/29/2014  5:03 PM  Wound Length (cm) 4 cm 03/19/2014  3:11 PM  Wound Width (cm) 2 cm 03/19/2014  3:11 PM  Wound Depth (cm) 1 cm 03/19/2014  3:11 PM  Margins Unattached edges (unapproximated) 03/26/2014  2:06 PM  Drainage Amount Moderate 03/29/2014  5:03 PM  Drainage Description Sanguineous 03/29/2014  5:03 PM  Non-staged Wound Description Full thickness 03/26/2014  2:06 PM  Treatment Cleansed;Debridement (Selective) 03/29/2014  5:03 PM     Wound / Incision (Open or Dehisced) 03/19/14 Leg Left;Posterior superior to second posterior wound (Active)  Dressing Type Compression wrap;Honeycomb 03/29/2014  5:03 PM  Dressing Changed Changed 03/26/2014  2:06 PM  Dressing Change Frequency Monday, Wednesday, Friday 03/19/2014  3:11 PM  Site / Wound Assessment Dusky;Granulation tissue 03/29/2014  5:03 PM  % Wound base Red or Granulating 85% 03/29/2014  5:03 PM  % Wound base Yellow 15% 03/29/2014  5:03 PM  Peri-wound Assessment Black;Edema;Hemosiderin 03/29/2014  5:03 PM  Wound Length (cm) 2.5 cm 03/19/2014  3:11 PM  Wound Width (cm) 2 cm 03/19/2014  3:11 PM  Wound Depth (cm) 0.1 cm 03/19/2014  3:11 PM  Margins Unattached edges (unapproximated) 03/26/2014  2:06 PM  Drainage Amount Moderate 03/26/2014  2:06 PM  Drainage Description Serosanguineous 03/26/2014  2:06 PM  Treatment Cleansed 03/29/2014  5:03 PM        Physical Therapy Assessment and Plan  CVI ulcers with significant edema.  Pt will need continued debridement and compression.  Changed to honey secondary to wounds not having as much drainage now.  Assess how honey does for wounds.           Problem List Patient Active Problem List   Diagnosis Date Noted  . 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 Leg wound, left 08/19/2011  . DVT (deep venous thrombosis) 06/15/2011  . H/O PE (pulmonary embolism) 06/15/2011  . 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

## 2014-03-29 NOTE — Progress Notes (Signed)
Labs drawn today for pt

## 2014-04-02 ENCOUNTER — Encounter (HOSPITAL_COMMUNITY): Payer: Self-pay

## 2014-04-02 ENCOUNTER — Encounter (HOSPITAL_COMMUNITY): Payer: Self-pay | Attending: Oncology

## 2014-04-02 ENCOUNTER — Encounter (HOSPITAL_COMMUNITY): Payer: Self-pay | Admitting: *Deleted

## 2014-04-02 VITALS — BP 143/82 | HR 73 | Temp 97.4°F | Resp 20 | Wt 288.6 lb

## 2014-04-02 DIAGNOSIS — F17200 Nicotine dependence, unspecified, uncomplicated: Secondary | ICD-10-CM

## 2014-04-02 DIAGNOSIS — I82409 Acute embolism and thrombosis of unspecified deep veins of unspecified lower extremity: Secondary | ICD-10-CM

## 2014-04-02 DIAGNOSIS — I2699 Other pulmonary embolism without acute cor pulmonale: Secondary | ICD-10-CM

## 2014-04-02 DIAGNOSIS — I871 Compression of vein: Secondary | ICD-10-CM

## 2014-04-02 DIAGNOSIS — D6869 Other thrombophilia: Secondary | ICD-10-CM

## 2014-04-02 LAB — PROTIME-INR
INR: 2.47 — ABNORMAL HIGH (ref 0.00–1.49)
Prothrombin Time: 25.9 seconds — ABNORMAL HIGH (ref 11.6–15.2)

## 2014-04-02 NOTE — Patient Instructions (Addendum)
[facility] Discharge Instructions  RECOMMENDATIONS MADE BY THE CONSULTANT AND ANY TEST RESULTS WILL BE SENT TO YOUR REFERRING PHYSICIAN.  Return in 3 months to see the doctor and repeat lab work.  Thank you for choosing [facility] to provide your oncology and hematology care.  To afford each patient quality time with our providers, please arrive at least 15 minutes before your scheduled appointment time.  With your help, our goal is to use those 15 minutes to complete the necessary work-up to ensure our physicians have the information they need to help with your evaluation and healthcare recommendations.    Effective January 1st, 2014, we ask that you re-schedule your appointment with our physicians should you arrive 10 or more minutes late for your appointment.  We strive to give you quality time with our providers, and arriving late affects you and other patients whose appointments are after yours.    Again, thank you for choosing [facility].  Our hope is that these requests will decrease the amount of time that you wait before being seen by our physicians.       _____________________________________________________________  Should you have questions after your visit to [facility], please contact our office at [phone] between the hours of 8:30 a.m. and 5:00 p.m.  Voicemails left after 4:30 p.m. will not be returned until the following business day.  For prescription refill requests, have your pharmacy contact our office with your prescription refill request.

## 2014-04-02 NOTE — Progress Notes (Signed)
Labs drawn today for pt 

## 2014-04-02 NOTE — Progress Notes (Signed)
[facility]  OFFICE PROGRESS NOTE  [name], MD [address] [place] [address]  DIAGNOSIS: DVT (deep venous thrombosis) - Plan: D-dimer, quantitative, Protime-INR, Protime-INR  IVC (inferior vena cava obstruction) - Plan: D-dimer, quantitative, Protime-INR, Protime-INR  Chief Complaint  Patient presents with  . Thrombophilia  . IVC congenital malformation    CURRENT THERAPY: Oral anticoagulation  INTERVAL HISTORY: Matthew Wise 53 y.o. 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 He was given a prescription for Xarelto in February 2015 but never got the prescription filled. He still reluctant on switching. He's had chronic lower 70 swelling left greater than right with the no chest pain, palpitations, PND, orthopnea, headache, fever, night sweats. He denies any epistaxis, melena, hematochezia, hematuria, or hemoptysis.  MEDICAL HISTORY: Past Medical History  Diagnosis Date  . DVT of leg (deep venous thrombosis)     rt. leg  . Inferior vena caval thrombosis     Chronic  . DVT (deep venous thrombosis) 06/15/2011  . H/O PE (pulmonary embolism) 06/15/2011  . 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    ALLERGIES:  has No Known Allergies.  MEDICATIONS: has a current medication list which includes the following prescription(s): naproxen, oxycodone, sulfamethoxazole-trimethoprim, warfarin, and rivaroxaban.  SURGICAL  HISTORY: History reviewed. No pertinent past surgical history.  FAMILY HISTORY: family history includes COPD in his father; Cancer in his brother; Stroke in his brother.  SOCIAL HISTORY:  reports that he has been smoking Cigarettes.  He has a 30 pack-year smoking history. He has never used smokeless tobacco. He reports that he drinks about 14.4 ounces of alcohol per week. He reports that he does not use illicit drugs.  REVIEW OF SYSTEMS:  Other than that discussed above is noncontributory.  PHYSICAL EXAMINATION: ECOG PERFORMANCE STATUS: 1 - Symptomatic but completely ambulatory  Blood pressure 143/82, pulse 73, temperature 97.4 F (36.3 C), temperature source Oral, resp. rate 20, weight 288 lb 9.6 oz (130.908 kg).  GENERAL:alert, no distress and comfortable SKIN: skin color, texture, turgor are normal, no rashes or significant lesions EYES: PERLA; Conjunctiva are pink and non-injected, sclera clear SINUSES: No redness or tenderness over maxillary or ethmoid sinuses OROPHARYNX:no exudate, no erythema on lips, buccal mucosa, or tongue. NECK: supple, thyroid normal size, non-tender, without nodularity. No masses CHEST: Increased AP diameter with no breast masses. LYMPH:  no palpable lymphadenopathy in the cervical, axillary or inguinal LUNGS: clear to auscultation and percussion with normal breathing effort HEART: regular rate & rhythm and no murmurs. ABDOMEN:abdomen soft, non-tender and normal bowel sounds MUSCULOSKELETAL:no cyanosis of digits and no clubbing. Range of motion normal. Bilateral lower 70 swelling with superficial varicosities. Homans sign negative. NEURO: alert & oriented x 3 with fluent speech, no focal motor/sensory deficits   LABORATORY DATA: Infusion on 04/02/2014  Component Date Value Ref Range Status  . Prothrombin Time 04/02/2014 25.9* 11.6 - 15.2 seconds Final  . INR 04/02/2014 2.47* 0.00 - 1.49 Final  Infusion on 03/29/2014  Component Date Value Ref Range  Status  .  Prothrombin Time 03/29/2014 21.7* 11.6 - 15.2 seconds Final  . INR 03/29/2014 1.96* 0.00 - 1.49 Final  Infusion on 03/27/2014  Component Date Value Ref Range Status  . D-Dimer, Quant 03/27/2014 0.49* 0.00 - 0.48 ug/mL-FEU Final   Comment:                                 AT THE INHOUSE ESTABLISHED CUTOFF                          VALUE OF 0.48 ug/mL FEU,                          THIS ASSAY HAS BEEN DOCUMENTED                          IN THE LITERATURE TO HAVE                          A SENSITIVITY AND NEGATIVE                          PREDICTIVE VALUE OF AT LEAST                          98 TO 99%.  THE TEST RESULT                          SHOULD BE CORRELATED WITH                          AN ASSESSMENT OF THE CLINICAL                          PROBABILITY OF DVT / VTE.  Marland Kitchen Prothrombin Time 03/27/2014 18.2* 11.6 - 15.2 seconds Final  . INR 03/27/2014 1.55* 0.00 - 1.49 Final  Infusion on 03/06/2014  Component Date Value Ref Range Status  . Prothrombin Time 03/06/2014 18.4* 11.6 - 15.2 seconds Final  . INR 03/06/2014 1.58* 0.00 - 1.49 Final    PATHOLOGY: No new pathology.  Urinalysis    Component Value Date/Time   COLORURINE YELLOW 12/10/2008 0743   APPEARANCEUR CLEAR 12/10/2008 0743   LABSPEC 1.017 12/10/2008 0743   PHURINE 5.5 12/10/2008 0743   GLUCOSEU NEGATIVE 12/10/2008 0743   HGBUR NEGATIVE 12/10/2008 0743   BILIRUBINUR NEGATIVE 12/10/2008 0743   KETONESUR NEGATIVE 12/10/2008 0743   PROTEINUR NEGATIVE 12/10/2008 0743   UROBILINOGEN 1.0 12/10/2008 0743   NITRITE NEGATIVE 12/10/2008 0743   LEUKOCYTESUR NEGATIVE MICROSCOPIC NOT DONE ON URINES WITH NEGATIVE PROTEIN, BLOOD, LEUKOCYTES, NITRITE, OR GLUCOSE <1000 mg/dL. 12/10/2008 0743    RADIOGRAPHIC STUDIES: No results found.  ASSESSMENT:  #1.Hypercoagulable state secondary to congenital interruption of his inferior vena cava with chronic DVTs of his legs complicated by at least one pulmonary embolus for which we have  recommended lifelong Coumadin. Patient seen by Dr. Christoper Allegra at [facility] who found that he also has sickle cell trait which increases the patient's risk of DVTs. He did not have cardiolipin antibodies, nor beta-2 glycoprotein antibodies. It is believed by both, Dr. Mare Ferrari and [place], that he is not a good candidate for  ongoing work and is a candidate for permanent disability #2. Continued tobacco use.   PLAN:  #1. Patient still reluctant to switch to Xarelto. He is currently taking 10 mg of warfarin alternating with 12.5 mg daily and will continue on the same dosage. #2. Monthly INR #3. Followup in 3 months with CBC, PT, and d-dimer.   All questions were answered. The patient knows to call the clinic with any problems, questions or concerns. We can certainly see the patient much sooner if necessary.   I spent 25 minutes counseling the patient face to face. The total time spent in the appointment was 30 minutes.    Farrel Gobble, MD 04/02/2014 9:51 AM  DISCLAIMER:  This note was dictated with voice recognition software.  Similar sounding words can inadvertently be transcribed inaccurately and may not be corrected upon review.

## 2014-04-02 NOTE — Progress Notes (Signed)
  Please inform Mr. Vinsant to stay on Warfarin 10mg  alternating with 12.5mg  daily. Thanks. Dr.F .  Patient instructed via voicemail to continue the previous instructions. Return on Thursday 04/05/14 for labs.

## 2014-04-02 NOTE — Addendum Note (Signed)
Addended by: Jeannette How D on: 04/02/2014 10:22 AM   Modules accepted: Orders

## 2014-04-03 ENCOUNTER — Ambulatory Visit (HOSPITAL_COMMUNITY)
Admission: RE | Admit: 2014-04-03 | Discharge: 2014-04-03 | Disposition: A | Discharge status: Home / Self Care | Payer: Self-pay | Source: Ambulatory Visit | Attending: Hematology and Oncology | Admitting: Hematology and Oncology

## 2014-04-03 ENCOUNTER — Other Ambulatory Visit (HOSPITAL_COMMUNITY): Payer: Self-pay

## 2014-04-03 DIAGNOSIS — I872 Venous insufficiency (chronic) (peripheral): Secondary | ICD-10-CM | POA: Insufficient documentation

## 2014-04-03 DIAGNOSIS — L97809 Non-pressure chronic ulcer of other part of unspecified lower leg with unspecified severity: Secondary | ICD-10-CM | POA: Insufficient documentation

## 2014-04-03 DIAGNOSIS — I825Z9 Chronic embolism and thrombosis of unspecified deep veins of unspecified distal lower extremity: Secondary | ICD-10-CM | POA: Insufficient documentation

## 2014-04-03 DIAGNOSIS — M25469 Effusion, unspecified knee: Secondary | ICD-10-CM | POA: Insufficient documentation

## 2014-04-03 DIAGNOSIS — Z7901 Long term (current) use of anticoagulants: Secondary | ICD-10-CM | POA: Insufficient documentation

## 2014-04-03 DIAGNOSIS — IMO0001 Reserved for inherently not codable concepts without codable children: Secondary | ICD-10-CM | POA: Insufficient documentation

## 2014-04-03 NOTE — Progress Notes (Signed)
Physical Therapy - Wound Therapy  Treatment   Patient Details  Name: Matthew Wise MRN: [id number] Date of Birth: Mar 15, 1961  Today's Date: 04/03/2014 Time: 1350-1430 Time Calculation (min): 40 min Charges: Selective debridement (= or < 20 cm)   Visit#: 7 of 8  Re-eval: 04/07/14  Subjective Subjective Assessment Subjective: Pt states that he kept compression warps on for a few days but he removed them when they began to slide down.  Pain Assessment Pain Assessment Pain Assessment: No/denies pain  Wound Therapy  04/03/14 1400  Wound / Incision (Open or Dehisced) 03/19/14  Leg Posterior;Left  Date First Assessed: 03/19/14   Wound Type: (c)   Location: Leg  Location Orientation: Posterior;Left  Dressing Type Compression wrap (Honey gel)  Dressing Changed Changed  Site / Wound Assessment Brown;Dusky;Granulation tissue  % Wound base Red or Granulating 80%  % Wound base Yellow 20%  Peri-wound Assessment Edema  Treatment Cleansed;Debridement (Selective)  Wound / Incision (Open or Dehisced) 03/08/14 Other (Comment) Leg Right;Posterior  Date First Assessed: 03/19/14   Location: Leg  Location Orientation: Left;Posterior  Wound Description (Comments): superior to second posterior wound  Present on Admission: No  Dressing Type Compression wrap (Honey gel)  Site / Wound Assessment Dusky;Granulation tissue  % Wound base Red or Granulating 85%  % Wound base Yellow 15%  Peri-wound Assessment Black;Edema;Hemosiderin  Treatment Cleansed;Debridement (Selective)  Wound / Incision (Open or Dehisced)  Date First Assessed/Time First Assessed: 03/08/14 1330   Wound Type: (c) Other (Comment)  Location: Leg  Location Orientation: Right;Posterior  Present on Admission: Yes  Dressing Type Compression wrap (Honey gel)  Dressing Changed Changed  Dressing Status Clean  Site / Wound Assessment Dusky;Granulation tissue  % Wound base Red or Granulating 60%  % Wound base Yellow 40%  % Wound base  Black 10%   Physical Therapy Assessment and Plan Wound Therapy - Assess/Plan/Recommendations Wound Therapy - Clinical Statement: Wounds and periwound areas appears healthier. This is most likely due to antibiotic use. Pt tolerates debridement well. Wound appears to be responding well to honey. Continues with profore wrap from metatarsal head to tibial tuberosity. Wound Plan: Reassess next session.  Problem List Patient Active Problem List   Diagnosis Date Noted  . 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 Leg wound, left 08/19/2011  . DVT (deep venous thrombosis) 06/15/2011  . H/O PE (pulmonary embolism) 06/15/2011  . 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 IVC obstruction 06/12/2011   Rachelle Hora, PTA 04/03/2014, 2:42 PM

## 2014-04-05 ENCOUNTER — Telehealth (HOSPITAL_COMMUNITY): Payer: Self-pay

## 2014-04-05 ENCOUNTER — Ambulatory Visit (HOSPITAL_COMMUNITY): Payer: Self-pay | Admitting: *Deleted

## 2014-04-05 ENCOUNTER — Encounter (HOSPITAL_BASED_OUTPATIENT_CLINIC_OR_DEPARTMENT_OTHER): Payer: Self-pay

## 2014-04-05 DIAGNOSIS — I82409 Acute embolism and thrombosis of unspecified deep veins of unspecified lower extremity: Secondary | ICD-10-CM

## 2014-04-05 DIAGNOSIS — I871 Compression of vein: Secondary | ICD-10-CM

## 2014-04-05 LAB — PROTIME-INR
INR: 2.17 — AB (ref 0.00–1.49)
Prothrombin Time: 23.5 seconds — ABNORMAL HIGH (ref 11.6–15.2)

## 2014-04-05 NOTE — Progress Notes (Signed)
Patient instructed to continue same  coumadin 12.5mg  alt. 10mg  and to return for next PT/INR on 04/09/14.  Verbalizes understanding.

## 2014-04-05 NOTE — Telephone Encounter (Signed)
Message copied by Mellissa Kohut on Thu Apr 05, 2014  2:58 PM ------      Message from: [place], [place]: Thu Apr 05, 2014 11:10 AM       Same dosage of warfarin.  Please call patient.   ------

## 2014-04-09 ENCOUNTER — Encounter (HOSPITAL_COMMUNITY): Payer: Self-pay

## 2014-04-11 ENCOUNTER — Ambulatory Visit (HOSPITAL_COMMUNITY)
Admission: RE | Admit: 2014-04-11 | Discharge: 2014-04-11 | Disposition: A | Discharge status: Home / Self Care | Payer: Self-pay | Source: Ambulatory Visit | Attending: Internal Medicine | Admitting: Internal Medicine

## 2014-04-11 NOTE — Progress Notes (Signed)
Physical Therapy - Wound Therapy  Re-evaluation   Patient Details  Name: Matthew Wise MRN: [id number] Date of Birth: 10-Apr-1961  Today's Date: 04/11/2014 Time: 9833-8250 Time Calculation (min): 50 min Charges: Selective debridement (= or < 20 cm)   Visit#: 8 of 16  Re-eval: 04/07/14  Subjective Subjective Assessment Subjective: Pt states states that he was able to keep profore on for three days.   Pain Assessment Pain Assessment Pain Assessment: No/denies pain  Wound Therapy  04/11/14 1700  Wound / Incision (Open or Dehisced)  Date First Assessed: 03/19/14   Wound Type: (c)   Location: Leg  Location Orientation: Posterior;Left  Dressing Type Compression wrap (honey gel)  Dressing Changed Changed  Site / Wound Assessment Dry;Red  % Wound base Red or Granulating 95%  % Wound base Yellow 5%  Peri-wound Assessment Black;Edema (dry)  Wound Length (cm) 0.5 cm (was 4 on 03/19/14)  Wound Width (cm) 0.3 cm (was 2 on 03/19/14)  Wound Depth (cm) 0 cm (was 1 on 03/19/14)  Treatment Cleansed;Debridement (Selective)  Wound / Incision (Open or Dehisced)  Date First Assessed: 03/19/14   Location: Leg  Location Orientation: Left;Posterior  Wound Description (Comments): superior to second posterior wound  Present on Admission: No  Dressing Type Compression wrap (honey gel)  Site / Wound Assessment Dry;Red  % Wound base Red or Granulating 95%  % Wound base Yellow 5%  Peri-wound Assessment Black;Edema;Hemosiderin  Wound Length (cm) (Now 4 small areas each .5cm in diameter)  Treatment Cleansed;Debridement (Selective)  Wound / Incision (Open or Dehisced)  Date First Assessed/Time First Assessed: 03/08/14 1330   Wound Type: (c) Other (Comment)  Location: Leg  Location Orientation: Right;Posterior  Present on Admission: Yes  Dressing Type Compression wrap (honey gel)  Dressing Changed Changed  Site / Wound Assessment Dry;Red  % Wound base Red or Granulating 90%  % Wound base  Yellow 100%  % Wound base Black 0%  Wound Length (cm) (Now two small wounds; medial: 1.9cm lateral 1cm)  Wound Width (cm) (Now two small wounds; medial: 2.2 cm lateral 3 cm)  Wound Depth (cm) (Now two small wounds; medial: 0 cm lateral .1 cm)        Physical Therapy Assessment and Plan Wound Therapy - Assess/Plan/Recommendations Wound Therapy - Clinical Statement: Wounds appear to be approximating well. Overall skin integrity appears to be improving as well. Pt tolerates debridement well. Continued with medihoney to all wounds. Vinegar/water solution applied to BLE to improve skin integrity. Pt would benefit from continuing skilled wound care to facilitate wound healing and reduce risk of infection.  Hydrotherapy Plan: Debridement;Dressing change;Patient/family education Wound Therapy - Frequency:  (2X/week) Wound Plan: Recommend to continue wound care 2 x per week x 4 weeks.      Goals Wound Therapy Goals - Improve the function of patient's integumentary system by progressing the wound(s) through the phases of wound healing by: Decrease Necrotic Tissue to: 0 Decrease Necrotic Tissue - Progress: Progressing toward goal Increase Granulation Tissue to: 100 Increase Granulation Tissue - Progress: Progressing toward goal Decrease Length/Width/Depth by (cm): STG:  wounds decreased by 50%;   LTG wounds to be healed as the pt has had chronic hx of wounds for over a year now. Decrease Length/Width/Depth - Progress: Progressing toward goal Improve Drainage Characteristics:  (none) Improve Drainage Characteristics - Progress: Progressing toward goal Patient/Family will be able to : vebalize the importance of getting new compression stockings and wearing every day. Patient/Family Instruction Goal - Progress: Progressing  toward goal Time For Goal Achievement:  (4 weeks) Wound Therapy - Potential for Goals: Good  Problem List Patient Active Problem List   Diagnosis Date Noted  . IVC (inferior  vena cava obstruction) 09/11/2011  . Leg wound, left 08/19/2011  . DVT (deep venous thrombosis) 06/15/2011  . H/O PE (pulmonary embolism) 06/15/2011  . 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 IVC obstruction 06/12/2011     Rachelle Hora, PTA  04/11/2014, 6:08 PM

## 2014-04-12 ENCOUNTER — Encounter (HOSPITAL_BASED_OUTPATIENT_CLINIC_OR_DEPARTMENT_OTHER): Payer: Self-pay

## 2014-04-12 ENCOUNTER — Encounter (HOSPITAL_COMMUNITY): Payer: Self-pay | Admitting: *Deleted

## 2014-04-12 DIAGNOSIS — I82409 Acute embolism and thrombosis of unspecified deep veins of unspecified lower extremity: Secondary | ICD-10-CM

## 2014-04-12 LAB — PROTIME-INR
INR: 2.16 — ABNORMAL HIGH (ref 0.00–1.49)
Prothrombin Time: 23.4 seconds — ABNORMAL HIGH (ref 11.6–15.2)

## 2014-04-12 NOTE — Progress Notes (Signed)
Pt contacted via telephone; instructed to continue same dose of Coumadin (12.5mg  alt with 10mg ). Verbalizes understanding and scheduled for repeat PT/INR on 04/26/14 at 0830.

## 2014-04-13 ENCOUNTER — Inpatient Hospital Stay (HOSPITAL_COMMUNITY): Admission: RE | Admit: 2014-04-13 | Payer: Self-pay | Source: Ambulatory Visit | Admitting: Physical Therapy

## 2014-04-13 ENCOUNTER — Telehealth (HOSPITAL_COMMUNITY): Payer: Self-pay

## 2014-04-17 ENCOUNTER — Ambulatory Visit (HOSPITAL_COMMUNITY)
Admission: RE | Admit: 2014-04-17 | Discharge: 2014-04-17 | Disposition: A | Discharge status: Home / Self Care | Payer: Self-pay | Source: Ambulatory Visit | Attending: Internal Medicine | Admitting: Internal Medicine

## 2014-04-17 NOTE — Progress Notes (Signed)
Physical Therapy - Wound Therapy  Treatment   Patient Details  Name: Matthew Wise MRN: [id number] Date of Birth: March 17, 1961  Today's Date: 04/17/2014 Time: 1062-6948 Time Calculation (min): 45 min Visit#: 9 of 16  Re-eval: 04/07/14 Charges:  Deb<20cm  Subjective Subjective Assessment Subjective: Pt comes today without bandages or compression on Bilateral LE's.  Pt states he was helping a friend paint a house, went home to take a shower and did not have time to redress or add compression before his appointment.  Pt states his legs get tender at time but do not hurt him.   Wound Therapy Wound / Incision (Open or Dehisced) 03/08/14 Other (Comment) Leg Right;Posterior (Active)  Dressing Type Profore compression, medihoney gel 04/17/2014  4:00 PM  Dressing Changed Changed 04/17/2014  4:00 PM  Dressing Status Clean 04/03/2014  2:00 PM  Dressing Change Frequency Monday, Wednesday, Friday 03/19/2014  3:11 PM  Site / Wound Assessment Dry;Red 04/17/2014  4:00 PM  % Wound base Red or Granulating 90% 04/17/2014  4:00 PM  % Wound base Yellow 100% 04/17/2014  4:00 PM  % Wound base Black 0% 04/17/2014  4:00 PM  Peri-wound Assessment Edema;Other (Comment) 04/17/2014  4:00 PM  Wound Length (cm) 5 cm 03/22/2014  3:40 PM  Wound Width (cm) 8.8 cm 03/22/2014  3:40 PM  Wound Depth (cm) 0.3 cm 03/08/2014  2:27 PM  Margins Unattached edges (unapproximated) 03/08/2014  2:27 PM  Drainage Amount Minimal 04/17/2014  4:00 PM  Drainage Description Serous 04/17/2014  4:00 PM  Treatment Cleansed;Debridement (Selective) 04/17/2014  4:00 PM     Wound / Incision (Open or Dehisced) 03/19/14  Leg Posterior;Left (Active)  Dressing Type Profore compression, Impregnated gauze (bismuth) 04/17/2014  4:00 PM  Dressing Changed Changed 04/17/2014  4:00 PM  Dressing Change Frequency Other (Comment) 03/26/2014  2:06 PM  Site / Wound Assessment Dry;Red 04/17/2014  4:00 PM  % Wound base Red or Granulating 95% 04/17/2014  4:00 PM  % Wound base  Yellow 5% 04/17/2014  4:00 PM  Peri-wound Assessment Other (Comment);Edema 04/17/2014  4:00 PM  Wound Length (cm) 0.5 cm 04/11/2014  5:00 PM  Wound Width (cm) 0.3 cm 04/11/2014  5:00 PM  Wound Depth (cm) 0 cm 04/11/2014  5:00 PM  Margins Unattached edges (unapproximated) 03/26/2014  2:06 PM  Drainage Amount None 04/17/2014  4:00 PM  Drainage Description Sanguineous 03/29/2014  5:03 PM  Non-staged Wound Description Full thickness 03/26/2014  2:06 PM  Treatment Cleansed;Debridement (Selective) 04/17/2014  4:00 PM     Wound / Incision (Open or Dehisced) 03/19/14 Leg Left;Posterior superior to second posterior wound (Active)  Dressing Type profore compression;Impregnated gauze (bismuth) 04/17/2014  4:00 PM  Dressing Changed Changed 04/17/2014  4:00 PM  Dressing Change Frequency Monday, Wednesday, Friday 03/19/2014  3:11 PM  Site / Wound Assessment Dry;Red 04/17/2014  4:00 PM  % Wound base Red or Granulating 95% 04/17/2014  4:00 PM  % Wound base Yellow 5% 04/17/2014  4:00 PM  Peri-wound Assessment Edema;Other (Comment) 04/17/2014  4:00 PM  Wound Length (cm) 2.5 cm 03/19/2014  3:11 PM  Wound Width (cm) 2 cm 03/19/2014  3:11 PM  Wound Depth (cm) 0.1 cm 03/19/2014  3:11 PM  Margins Unattached edges (unapproximated) 03/26/2014  2:06 PM  Drainage Amount None 04/17/2014  4:00 PM  Drainage Description Serosanguineous 03/26/2014  2:06 PM  Treatment Cleansed;Debridement (Selective) 04/17/2014  4:00 PM       Physical Therapy Assessment and Plan Wound Therapy - Assess/Plan/Recommendations Wound Therapy -  Clinical Statement: No drainage apparent from Lt LE wounds.  Changed dressings to xeroform on Lt LE to decrease dryness and continued with medihoney gel on Rt LE.  Pt tolerates debridement with continued removal of dry skin from perimeter of all wounds bilaterally and slough from edge of wound bed on Rt LE wounds. Vinegar/water solution applied to BLE to improve skin integrity prior to application of lotion and dressings.  Educated patient on the importance of keeping a dressing over wounds to prevent infection.  PT verbalized understanding.  Hydrotherapy Plan: Debridement;Dressing change;Patient/family education Wound Therapy - Frequency: Other (comment) (1X week) Wound Plan: Continue weekly woundcare and compression wraps       Problem List Patient Active Problem List   Diagnosis Date Noted  . 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 Leg wound, left 08/19/2011  . DVT (deep venous thrombosis) 06/15/2011  . H/O PE (pulmonary embolism) 06/15/2011  . 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 IVC obstruction 06/12/2011     Teena Irani, PTA/CLT 04/17/2014, 4:46 PM

## 2014-04-19 ENCOUNTER — Ambulatory Visit (HOSPITAL_COMMUNITY): Payer: Self-pay | Admitting: Physical Therapy

## 2014-04-24 ENCOUNTER — Ambulatory Visit (HOSPITAL_COMMUNITY)
Admission: RE | Admit: 2014-04-24 | Discharge: 2014-04-24 | Disposition: A | Discharge status: Home / Self Care | Payer: Self-pay | Source: Ambulatory Visit | Attending: Internal Medicine | Admitting: Internal Medicine

## 2014-04-24 NOTE — Progress Notes (Signed)
Physical Therapy - Wound Therapy  Treatment   Patient Details  Name: Matthew Wise MRN: [id number] Date of Birth: [birth year]  Today's Date: 04/24/2014 Time: 2542-7062 Time Calculation (min): 45 min    Charges: Debridement  <20cm, 1355-131 Visit#: 10 of 16  Re-eval: 05/08/14  Subjective Subjective Assessment Subjective: Pt comes today without bandages on LEs but is wearing compresion hose Patient and Family Stated Goals: wounds to heal Date of Onset: 01/08/14  Pain Assessment Pain Assessment Pain Assessment: No/denies pain  Wound Therapy Wound / Incision (Open or Dehisced) 03/08/14 Other (Comment) Leg Right;Posterior (Active)  Dressing Type Compression wrap 04/24/2014  6:39 PM  Dressing Changed Changed 04/17/2014  4:00 PM  Dressing Status Clean 04/03/2014  2:00 PM  Dressing Change Frequency Monday, Wednesday, Friday 03/19/2014  3:11 PM  Site / Wound Assessment Dry;Red 04/24/2014  6:39 PM  % Wound base Red or Granulating 90% 04/24/2014  6:39 PM  % Wound base Yellow 100% 04/24/2014  6:39 PM  % Wound base Black 0% 04/24/2014  6:39 PM  Peri-wound Assessment Edema;Other (Comment) 04/24/2014  6:39 PM  Wound Length (cm) 5 cm 03/22/2014  3:40 PM  Wound Width (cm) 8.8 cm 03/22/2014  3:40 PM  Wound Depth (cm) 0.3 cm 03/08/2014  2:27 PM  Margins Unattached edges (unapproximated) 03/08/2014  2:27 PM  Drainage Amount Minimal 04/24/2014  6:39 PM  Drainage Description Serous 04/24/2014  6:39 PM  Treatment Cleansed;Debridement (Selective) 04/24/2014  6:39 PM     Wound / Incision (Open or Dehisced) 03/19/14  Leg Posterior;Left (Active)  Dressing Type Compression wrap;Impregnated gauze (bismuth) 04/24/2014  6:39 PM  Dressing Changed Changed 04/17/2014  4:00 PM  Dressing Change Frequency Other (Comment) 03/26/2014  2:06 PM  Site / Wound Assessment Dry;Red 04/24/2014  6:39 PM  % Wound base Red or Granulating 95% 04/24/2014  6:39 PM  % Wound base Yellow 5% 04/24/2014  6:39 PM  Peri-wound Assessment Other  (Comment);Edema 04/17/2014  4:00 PM  Wound Length (cm) 0.5 cm 04/11/2014  5:00 PM  Wound Width (cm) 0.3 cm 04/11/2014  5:00 PM  Wound Depth (cm) 0 cm 04/11/2014  5:00 PM  Margins Unattached edges (unapproximated) 03/26/2014  2:06 PM  Drainage Amount None 04/24/2014  6:39 PM  Drainage Description Sanguineous 03/29/2014  5:03 PM  Non-staged Wound Description Full thickness 03/26/2014  2:06 PM  Treatment Cleansed;Debridement (Selective) 04/24/2014  6:39 PM     Wound / Incision (Open or Dehisced) 03/19/14 Leg Left;Posterior superior to second posterior wound (Active)  Dressing Type Compression wrap;Impregnated gauze (bismuth) 04/24/2014  6:39 PM  Dressing Changed Changed 04/17/2014  4:00 PM  Dressing Change Frequency Monday, Wednesday, Friday 03/19/2014  3:11 PM  Site / Wound Assessment Dry;Red 04/24/2014  6:39 PM  % Wound base Red or Granulating 95% 04/24/2014  6:39 PM  % Wound base Yellow 5% 04/24/2014  6:39 PM  Peri-wound Assessment Edema;Other (Comment) 04/24/2014  6:39 PM  Wound Length (cm) 2.5 cm 03/19/2014  3:11 PM  Wound Width (cm) 2 cm 03/19/2014  3:11 PM  Wound Depth (cm) 0.1 cm 03/19/2014  3:11 PM  Margins Unattached edges (unapproximated) 03/26/2014  2:06 PM  Drainage Amount None 04/17/2014  4:00 PM  Drainage Description Serosanguineous 03/26/2014  2:06 PM  Treatment Cleansed;Debridement (Selective) 04/17/2014  4:00 PM      Physical Therapy Assessment and Plan Wound Therapy - Assess/Plan/Recommendations Wound Therapy - Clinical Statement: Wounds appear to be approximating well. Overall skin integrity appears to be improving as well. Pt tolerates debridement  well. Continued with medihoney to all wounds. Vinegar/water solution applied to BLE to improve skin integrity. Pt would benefit from continuing skilled wound care to facilitate wound healing and reduce risk of infection.  Hydrotherapy Plan: Debridement;Dressing change;Patient/family education Wound Therapy - Frequency: Other (comment) (1X  week) Wound Plan: Continue weekly woundcare and compression wraps      Goals Wound Therapy Goals - Improve the function of patient's integumentary system by progressing the wound(s) through the phases of wound healing by: Decrease Necrotic Tissue to: 0 Decrease Necrotic Tissue - Progress: Progressing toward goal Increase Granulation Tissue to: 100 Increase Granulation Tissue - Progress: Progressing toward goal Decrease Length/Width/Depth by (cm): STG:  wounds decreased by 50%;   LTG wounds to be healed as the pt has had chronic hx of wounds for over a year now. Decrease Length/Width/Depth - Progress: Progressing toward goal Improve Drainage Characteristics:  (none) Improve Drainage Characteristics - Progress: Progressing toward goal Patient/Family will be able to : vebalize the importance of getting new compression stockings and wearing every day. Patient/Family Instruction Goal - Progress: Progressing toward goal Time For Goal Achievement:  (4 weeks) Wound Therapy - Potential for Goals: Good  Problem List Patient Active Problem List   Diagnosis Date Noted  . 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 Leg wound, left 08/19/2011  . DVT (deep venous thrombosis) 06/15/2011  . H/O PE (pulmonary embolism) 06/15/2011  . 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

## 2014-04-26 ENCOUNTER — Ambulatory Visit (HOSPITAL_COMMUNITY): Payer: Self-pay | Admitting: Physical Therapy

## 2014-04-26 ENCOUNTER — Other Ambulatory Visit (HOSPITAL_COMMUNITY): Payer: Self-pay | Admitting: Hematology and Oncology

## 2014-04-26 ENCOUNTER — Encounter (HOSPITAL_BASED_OUTPATIENT_CLINIC_OR_DEPARTMENT_OTHER): Payer: Self-pay

## 2014-04-26 DIAGNOSIS — I82409 Acute embolism and thrombosis of unspecified deep veins of unspecified lower extremity: Secondary | ICD-10-CM

## 2014-04-26 LAB — PROTIME-INR
INR: 1.84 — ABNORMAL HIGH (ref 0.00–1.49)
Prothrombin Time: 20.7 seconds — ABNORMAL HIGH (ref 11.6–15.2)

## 2014-04-26 MED ORDER — OXYCODONE HCL 5 MG PO CAPS: 10.0000 mg | ORAL_CAPSULE | ORAL | Status: DC | PRN

## 2014-04-27 NOTE — Progress Notes (Signed)
Labs drawn

## 2014-05-01 ENCOUNTER — Ambulatory Visit (HOSPITAL_COMMUNITY)
Admission: RE | Admit: 2014-05-01 | Discharge: 2014-05-01 | Disposition: A | Discharge status: Home / Self Care | Payer: Self-pay | Source: Ambulatory Visit | Attending: Hematology and Oncology | Admitting: Hematology and Oncology

## 2014-05-01 DIAGNOSIS — L97809 Non-pressure chronic ulcer of other part of unspecified lower leg with unspecified severity: Secondary | ICD-10-CM | POA: Insufficient documentation

## 2014-05-01 DIAGNOSIS — IMO0001 Reserved for inherently not codable concepts without codable children: Secondary | ICD-10-CM | POA: Insufficient documentation

## 2014-05-01 DIAGNOSIS — M25469 Effusion, unspecified knee: Secondary | ICD-10-CM | POA: Insufficient documentation

## 2014-05-01 DIAGNOSIS — I825Z9 Chronic embolism and thrombosis of unspecified deep veins of unspecified distal lower extremity: Secondary | ICD-10-CM | POA: Insufficient documentation

## 2014-05-01 DIAGNOSIS — I872 Venous insufficiency (chronic) (peripheral): Secondary | ICD-10-CM | POA: Insufficient documentation

## 2014-05-01 DIAGNOSIS — Z7901 Long term (current) use of anticoagulants: Secondary | ICD-10-CM | POA: Insufficient documentation

## 2014-05-01 NOTE — Progress Notes (Signed)
Physical Therapy - Wound Therapy  Treatment   Patient Details  Name: Matthew Wise MRN: [id number] Date of Birth: [birth year]  Today's Date: 05/01/2014 Time: 1350-1430 Time Calculation (min): 40 min Charge: selective debridement <20 cm  Visit#: 11 of 16  Re-eval: 05/08/14  Subjective Subjective Assessment Subjective: Pain free, pt entered dept wtih juxtafit on Bil LE.  Pain Assessment Pain Assessment Pain Assessment: No/denies pain  Wound Therapy  05/01/14 1607  Subjective Assessment  Subjective Pain free, pt entered dept wtih juxtafit on Bil LE.  Wound / Incision (Open or Dehisced) 03/19/14 Leg Left;Posterior superior to second posterior wound  Date First Assessed: 03/19/14   Wound Type: (c)   Location: Leg  Location Orientation: Posterior;Left  Dressing Type Other (Comment);Gauze (Comment);Compression wrap (Medihoney)  Dressing Changed New  Dressing Status Clean;Dry  Site / Wound Assessment Dry;Red  % Wound base Red or Granulating 95%  % Wound base Yellow 5%  % Wound base Black 0%  Peri-wound Assessment Edema  Margins Unattached edges (unapproximated)  Drainage Amount None  Drainage Description Sanguineous  Non-staged Wound Description Full thickness  Treatment Cleansed;Debridement (Selective)  Wound / Incision (Open or Dehisced)  Date First Assessed/Time First Assessed: 03/08/14 1330   Wound Type: (c) Other (Comment)  Location: Leg  Location Orientation: Right;Posterior  Present on Admission: Yes  Dressing Type Impregnated gauze (bismuth);Gauze (Comment);Compression wrap  Dressing Status Clean;Dry  % Wound base Red or Granulating 90%  % Wound base Yellow 10%  % Wound base Black 0%  Drainage Amount Scant  Drainage Description Serous  Wound / Incision (Open or Dehisced)  Date First Assessed: 03/19/14   Location: Leg  Location Orientation: Left;Posterior  Wound Description (Comments): superior to second posterior wound  Present on Admission: No  Dressing Type  Other (Comment) (Medihoney)  Wound / Incision (Open or Dehisced)  Final Assessment Date/Final Assessment Time: 04/17/14 1631  Date First Assessed/Time First Assessed: 03/08/14 1314   Wound Type: (c) Other (Comment)  Location: Leg  Location Orientation: Medial  Wound Description (Comments): LT LE medial aspect  Present   Dressing Type Impregnated gauze (bismuth);Gauze (Comment);Compression wrap  Dressing Changed New  Dressing Status Clean;Dry  Site / Wound Assessment Dusky;Yellow;Black  % Wound base Red or Granulating 95%  % Wound base Yellow 5%  Drainage Amount Scant  Drainage Description No odor  Non-staged Wound Description Full thickness  Treatment Cleansed;Debridement (Selective)  Selective Debridement  Selective Debridement - Location Entire wound bed and periwound  Selective Debridement - Tools Used Forceps;Scalpel  Selective Debridement - Tissue Removed slough  Wound Therapy - Assess/Plan/Recommendations  Wound Therapy - Clinical Statement Wounds progressing well with overall skin integrity appears to be improving.  Continued wtih xeroform to anterior Lt, and Rt lateral wound, Medihoney applied to posterior Lt wound for added moisture to reduce dryness to promote healing.  Continued with lotion to whole legs and compression hose with justafit on top.  No reports of pain through session.    Selective Debridement Selective Debridement - Location: Entire wound bed and periwound Selective Debridement - Tools Used: Forceps;Scalpel Selective Debridement - Tissue Removed: slough   Physical Therapy Assessment and Plan Wound Therapy - Assess/Plan/Recommendations Wound Therapy - Clinical Statement: Wounds progressing well with overall skin integrity appears to be improving.  Continued wtih xeroform to anterior Lt, and Rt lateral wound, Medihoney applied to posterior Lt wound for added moisture to reduce dryness to promote healing.  Continued with lotion to whole legs and compression hose  with justafit on  top.  No reports of pain through session.      Goals    Problem List Patient Active Problem List   Diagnosis Date Noted  . 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 Leg wound, left 08/19/2011  . DVT (deep venous thrombosis) 06/15/2011  . H/O PE (pulmonary embolism) 06/15/2011  . 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

## 2014-05-08 ENCOUNTER — Ambulatory Visit (HOSPITAL_COMMUNITY)
Admission: RE | Admit: 2014-05-08 | Discharge: 2014-05-08 | Disposition: A | Discharge status: Home / Self Care | Payer: Self-pay | Source: Ambulatory Visit | Attending: Internal Medicine | Admitting: Internal Medicine

## 2014-05-08 NOTE — Progress Notes (Signed)
Physical Therapy - Wound Therapy Re-evaluation  Re-Evaluation   Patient Details  Name: ZUBIN PONTILLO MRN: [id number] Date of Birth: 03-20-1961  Today's Date: 05/08/2014 Time: 6761-9509 Time Calculation (min): 35 min Visit#: 12 of 24  Re-eval: 06/05/14 Charges:  Deb<20cm  Subjective Subjective Assessment Subjective: Painfree.  Enters clinic today without dressing on wound, however has compression garments on (knee high with zippers/toes out)  Pain Assessment  No pain reported  Wound Therapy Lt LE wounds are now healed 100%  Rt posterior LE wound: Wound / Incision (Open or Dehisced) 03/08/14 Other (Comment) Leg Right;Posterior (Active)  Dressing Type Impregnated gauze (petrolatum) 05/08/2014  2:37 PM  Dressing Changed New 05/08/2014  2:37 PM  Dressing Status Clean;Dry 05/08/2014  2:37 PM  Dressing Change Frequency Monday, Wednesday, Friday 03/19/2014  3:11 PM  Site / Wound Assessment Dry;Granulation tissue;Red;Pink 05/08/2014  2:37 PM  % Wound base Red or Granulating 95% 05/08/2014  2:37 PM  % Wound base Yellow 5% 05/08/2014  2:37 PM  % Wound base Black 0% 05/08/2014  2:37 PM  Peri-wound Assessment Intact 05/08/2014  2:37 PM  Wound Length (cm) 1 cm (was 1 cm on 5/13) 05/08/2014  2:37 PM  Wound Width (cm) 1.5 cm (was 3 cm on 5/13) 05/08/2014  2:37 PM  Wound Depth (cm) 0.1 cm (was 0.1 cm on 5/13) 05/08/2014  2:37 PM  Margins Unattached edges (unapproximated) 03/08/2014  2:27 PM  Closure None 05/08/2014  2:37 PM  Drainage Amount Scant 05/08/2014  2:37 PM  Drainage Description Serous 05/08/2014  2:37 PM  Treatment Cleansed;Debridement (Selective) 05/08/2014  2:37 PM   Selective Debridement Selective Debridement - Location: Entire wound bed and periwound of bilateral LE's Selective Debridement - Tools Used: Forceps Selective Debridement - Tissue Removed: slough and dry skin.  Physical Therapy Assessment and Plan Wound Therapy - Assess/Plan/Recommendations Wound Therapy - Clinical Statement: Wounds on  posterior Lt LE now completely healed.  Removed all dry scaling skin from locations with complete closure beneath.  Posterior Rt LE with one small wound remaining.  Debrided area and applied medihoney gel and gauze to area to promote closure.  Pt instructed to keep area covered and continue to wear compression hose or juxtafit over to maintain compression.  Pt also instructed to remove at night and apply lotion to bilateral LE's to decrease dryness to promote healing.  Pt verbalized understanding.  No reports of pain through session Plan:  Pt will benefit from continued woundcare 2X week for 4 more weeks to ensure proper healing of posterior Rt LE.      Goals Wound Therapy Goals - Improve the function of patient's integumentary system by progressing the wound(s) through the phases of wound healing by: Decrease Necrotic Tissue to: 0 Decrease Necrotic Tissue - Progress: Partly met Increase Granulation Tissue to: 100 Increase Granulation Tissue - Progress: Partly met Decrease Length/Width/Depth by (cm): STG:  wounds decreased by 50%;   LTG wounds to be healed as the pt has had chronic hx of wounds for over a year now. Decrease Length/Width/Depth - Progress: Partly met Improve Drainage Characteristics:  (none) Improve Drainage Characteristics - Progress: Partly met Patient/Family will be able to : vebalize the importance of getting new compression stockings and wearing every day. Patient/Family Instruction Goal - Progress: Progressing toward goal Additional Wound Therapy Goal - Progress: Progressing toward goal Time For Goal Achievement:  (4 weeks) Wound Therapy - Potential for Goals: Good   Problem List Patient Active Problem List   Diagnosis Date Noted  .  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 Leg wound, left 08/19/2011  . DVT (deep venous thrombosis) 06/15/2011  . H/O PE (pulmonary embolism) 06/15/2011  . 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 Post-phlebitic  syndrome 06/12/2011  . 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

## 2014-05-10 ENCOUNTER — Encounter (HOSPITAL_COMMUNITY): Payer: Self-pay | Attending: Hematology and Oncology

## 2014-05-10 DIAGNOSIS — I82409 Acute embolism and thrombosis of unspecified deep veins of unspecified lower extremity: Secondary | ICD-10-CM | POA: Insufficient documentation

## 2014-05-10 DIAGNOSIS — I871 Compression of vein: Secondary | ICD-10-CM | POA: Insufficient documentation

## 2014-05-10 LAB — PROTIME-INR
INR: 2.27 — ABNORMAL HIGH (ref 0.00–1.49)
Prothrombin Time: 24.3 seconds — ABNORMAL HIGH (ref 11.6–15.2)

## 2014-05-14 NOTE — Progress Notes (Signed)
Labs drawn

## 2014-05-15 ENCOUNTER — Inpatient Hospital Stay (HOSPITAL_COMMUNITY): Admission: RE | Admit: 2014-05-15 | Payer: Self-pay | Source: Ambulatory Visit | Admitting: Physical Therapy

## 2014-05-22 ENCOUNTER — Ambulatory Visit (HOSPITAL_COMMUNITY)
Admission: RE | Admit: 2014-05-22 | Discharge: 2014-05-22 | Disposition: A | Discharge status: Home / Self Care | Payer: Self-pay | Source: Ambulatory Visit | Attending: Internal Medicine | Admitting: Internal Medicine

## 2014-05-22 NOTE — Progress Notes (Signed)
Physical Therapy - Wound Therapy  Treatment   Patient Details  Name: Matthew Wise MRN: [id number] Date of Birth: 10/22/1961  Today's Date: 05/22/2014 Time: 2694-8546 Time Calculation (min): 30 min   Charges: Debridement 1345-1415 Visit#: 13 of 24  Re-eval: 06/05/14  Subjective Subjective Assessment Subjective: Painfree.  Enters clinic today without dressing on wound nor his compression stockings as he has returned from vacation and his compression stockings are in the wash.  Pain Assessment Pain Assessment Pain Assessment: No/denies pain  Wound Therapy Wound / Incision (Open or Dehisced) 03/08/14 Other (Comment) Leg Right;Posterior (Active)  Dressing Type Impregnated gauze (petrolatum) 05/22/2014  2:18 PM  Dressing Changed New 05/08/2014  2:37 PM  Dressing Status Clean;Dry 05/22/2014  2:18 PM  Dressing Change Frequency Monday, Wednesday, Friday 03/19/2014  3:11 PM  Site / Wound Assessment Dry;Granulation tissue;Red;Pink 05/22/2014  2:18 PM  % Wound base Red or Granulating 95% 05/22/2014  2:18 PM  % Wound base Yellow 5% 05/22/2014  2:18 PM  % Wound base Black 0% 05/22/2014  2:18 PM  Peri-wound Assessment Intact 05/22/2014  2:18 PM  Wound Length (cm) 1 cm 05/08/2014  2:37 PM  Wound Width (cm) 1.5 cm 05/08/2014  2:37 PM  Wound Depth (cm) 0.2 cm 05/08/2014  2:37 PM  Margins Unattached edges (unapproximated) 03/08/2014  2:27 PM  Closure None 05/22/2014  2:18 PM  Drainage Amount Scant 05/22/2014  2:18 PM  Drainage Description Serous 05/22/2014  2:18 PM  Treatment Cleansed;Debridement (Selective) 05/22/2014  2:18 PM   Physical Therapy Assessment and Plan Wound Therapy - Assess/Plan/Recommendations Wound Therapy - Clinical Statement: Wounds progressing well with overall skin integrity appears to be improving.   Medihoney applied to posterior Lt wound for added moisture to reduce dryness and to promote healing.  Continued with lotion to whole legs and compression hose with justafit on top.  No  reports of pain through session. Wound Plan: Continue weekly woundcare and compression wraps      Goals Wound Therapy Goals - Improve the function of patient's integumentary system by progressing the wound(s) through the phases of wound healing by: Decrease Necrotic Tissue to: 0 Decrease Necrotic Tissue - Progress: Partly met Increase Granulation Tissue to: 100 Increase Granulation Tissue - Progress: Partly met Decrease Length/Width/Depth by (cm): STG:  wounds decreased by 50%;   LTG wounds to be healed as the pt has had chronic hx of wounds for over a year now. Decrease Length/Width/Depth - Progress: Partly met Improve Drainage Characteristics:  (none) Improve Drainage Characteristics - Progress: Partly met Patient/Family will be able to : vebalize the importance of getting new compression stockings and wearing every day. Patient/Family Instruction Goal - Progress: Progressing toward goal Additional Wound Therapy Goal - Progress: Progressing toward goal Time For Goal Achievement:  (4 weeks) Wound Therapy - Potential for Goals: Good  Problem List Patient Active Problem List   Diagnosis Date Noted  . 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 Leg wound, left 08/19/2011  . DVT (deep venous thrombosis) 06/15/2011  . H/O PE (pulmonary embolism) 06/15/2011  . 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

## 2014-05-29 ENCOUNTER — Ambulatory Visit (HOSPITAL_COMMUNITY)
Admission: RE | Admit: 2014-05-29 | Discharge: 2014-05-29 | Disposition: A | Discharge status: Home / Self Care | Payer: Self-pay | Source: Ambulatory Visit | Attending: Internal Medicine | Admitting: Internal Medicine

## 2014-05-29 ENCOUNTER — Telehealth (HOSPITAL_COMMUNITY): Payer: Self-pay

## 2014-05-29 NOTE — Progress Notes (Signed)
Physical Therapy - Wound Therapy  Treatment   Patient Details  Name: Matthew Wise MRN: [id number] Date of Birth: 1961-06-13  Today's Date: 05/29/2014 Time: 6283-1517 Time Calculation (min): 40 min Charge: selective debridement <20 cm  Visit#: 14 of 24  Re-eval: 06/05/14  Subjective Subjective Assessment Subjective: Pt reported min drainage Lt and Rt LE, pt wearing compression hose that he got online. stated they are not that strong compression.  Pain Assessment Pain Assessment Pain Score: 4  Pain Location: Leg Pain Orientation: Right;Left;Posterior  Wound Therapy  05/29/14 1518  Subjective Assessment  Subjective Pt reported min drainage Lt and Rt LE, pt wearing compression hose that he got online. stated they are not that strong compression.  Wound / Incision (Open or Dehisced)  Date First Assessed/Time First Assessed: 03/08/14 1330   Wound Type: (c) Other (Comment)  Location: Leg  Location Orientation: Right;Posterior  Present on Admission: Yes  Dressing Type Other (Comment);Gauze (Comment);Compression wrap (Medihoney, pt.'s compresson hose)  Dressing Status Clean;Dry  Site / Wound Assessment Dry;Granulation tissue;Red;Pink  % Wound base Red or Granulating 95%  % Wound base Yellow 5%  % Wound base Black 0%  Peri-wound Assessment Intact  Closure None  Drainage Amount Scant  Drainage Description Serous  Treatment Cleansed;Debridement (Selective)  Wound / Incision (Open or Dehisced)  Final Assessment Date/Final Assessment Time: 06/21/13 1645  Date First Assessed/Time First Assessed: 05/17/13 0900   Wound Type: Other (Comment)  Location Orientation: Left;Posterior;Medial  Present on Admission: Yes  Dressing Type Other (Comment);Gauze (Comment);Compression wrap (medihoney and pt.'s compression hose)  Dressing Changed New  Dressing Status Clean;Dry;Intact  Site / Wound Assessment Granulation tissue;Dry;Pale  % Wound base Red or Granulating 95%  % Wound base Yellow 5%   % Wound base Black 0%  Peri-wound Assessment Edema;Induration  Drainage Amount Scant  Drainage Description Serous  Treatment Cleansed;Debridement (Selective)  Selective Debridement  Selective Debridement - Location Entire wound bed and periwound  Selective Debridement - Tools Used Forceps;Scalpel;Scissors  Selective Debridement - Tissue Removed slough and dry skin  Wound Therapy - Assess/Plan/Recommendations  Wound Therapy - Clinical Statement Discussed compression hose as they are not keeping legs decompressed.  Pt stated he had older pain of compression hose at home that are heavier compression.  Referral sent to MD for new 20-84mmHg knee high compression hose.  Selective debridement complete Bil LE for removal of slough and dryskin wound perimeter to promote healing.  Continued with medihoney dressings, gauze and compression.   Wound Plan Continue weekly woundcare and compression wraps    Selective Debridement Selective Debridement - Location: Entire wound bed and periwound Selective Debridement - Tools Used: Forceps;Scalpel;Scissors Selective Debridement - Tissue Removed: slough and dry skin   Physical Therapy Assessment and Plan Wound Therapy - Assess/Plan/Recommendations Wound Therapy - Clinical Statement: Discussed compression hose as they are not keeping legs decompressed.  Pt stated he had older pain of compression hose at home that are heavier compression.  Referral sent to MD for new 20-67mmHg knee high compression hose.  Selective debridement complete Bil LE for removal of slough and dryskin wound perimeter to promote healing.  Continued with medihoney dressings, gauze and compression.  Wound Plan: Continue weekly woundcare and compression wraps      Goals    Problem List Patient Active Problem List   Diagnosis Date Noted  . 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 Leg wound, left 08/19/2011  . DVT (deep venous thrombosis) 06/15/2011  . H/O PE (pulmonary  embolism) 06/15/2011  .  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

## 2014-06-05 ENCOUNTER — Other Ambulatory Visit (HOSPITAL_COMMUNITY): Payer: Self-pay

## 2014-06-05 ENCOUNTER — Ambulatory Visit (HOSPITAL_COMMUNITY): Payer: Self-pay | Admitting: Physical Therapy

## 2014-06-12 ENCOUNTER — Ambulatory Visit (HOSPITAL_COMMUNITY)
Admission: RE | Admit: 2014-06-12 | Discharge: 2014-06-12 | Disposition: A | Discharge status: Home / Self Care | Payer: Self-pay | Source: Ambulatory Visit | Attending: Hematology and Oncology | Admitting: Hematology and Oncology

## 2014-06-12 DIAGNOSIS — Z7901 Long term (current) use of anticoagulants: Secondary | ICD-10-CM | POA: Insufficient documentation

## 2014-06-12 DIAGNOSIS — I825Z9 Chronic embolism and thrombosis of unspecified deep veins of unspecified distal lower extremity: Secondary | ICD-10-CM | POA: Insufficient documentation

## 2014-06-12 DIAGNOSIS — IMO0001 Reserved for inherently not codable concepts without codable children: Secondary | ICD-10-CM | POA: Insufficient documentation

## 2014-06-12 DIAGNOSIS — I872 Venous insufficiency (chronic) (peripheral): Secondary | ICD-10-CM | POA: Insufficient documentation

## 2014-06-12 DIAGNOSIS — L97809 Non-pressure chronic ulcer of other part of unspecified lower leg with unspecified severity: Secondary | ICD-10-CM | POA: Insufficient documentation

## 2014-06-12 DIAGNOSIS — M25469 Effusion, unspecified knee: Secondary | ICD-10-CM | POA: Insufficient documentation

## 2014-06-12 NOTE — Progress Notes (Signed)
Physical Therapy - Wound Therapy  Treatment   Patient Details  Name: Matthew Wise MRN: [id number] Date of Birth: 06/02/1961  Today's Date: 06/12/2014 Time: 1435-1500 Time Calculation (min): 25 min  Visit#: 15 of 24  Re-eval: 06/05/14  Subjective Subjective Assessment Subjective: Pain free, no drainage.  Pt wearing compression hose.  Pain Assessment Pain Assessment Pain Assessment: No/denies pain  Wound Therapy  06/12/14 1501  Subjective Assessment  Subjective Pain free, no drainage.  Pt wearing compression hose.  Wound / Incision (Open or Dehisced)  Date First Assessed/Time First Assessed: 03/08/14 1330   Wound Type: (c) Other (Comment)  Location: Leg  Location Orientation: Right;Posterior  Present on Admission: Yes  Dressing Type Other (Comment) (bandaid, lotion and compression hose)  Dressing Status Clean;Dry  Site / Wound Assessment Dry;Granulation tissue;Red;Pink  % Wound base Red or Granulating 95%  % Wound base Yellow 5%  % Wound base Black 0%  Peri-wound Assessment Intact;Edema  Wound Length (cm) 0.8 cm  Wound Width (cm) 0.1 cm  Wound Depth (cm) 0 cm  Closure None  Drainage Amount None  Treatment Cleansed;Debridement (Selective)  Wound / Incision (Open or Dehisced)  Final Assessment Date/Final Assessment Time: 06/21/13 1645  Date First Assessed/Time First Assessed: 05/17/13 0900   Wound Type: Other (Comment)  Location Orientation: Left;Posterior;Medial  Present on Admission: Yes  Dressing Type Other (Comment) (lotion and compression hose)  Dressing Changed New  Dressing Status Clean;Dry;Intact  Site / Wound Assessment Granulation tissue  % Wound base Red or Granulating 100%  % Wound base Yellow 0%  Peri-wound Assessment Edema;Induration  Drainage Amount None  Treatment Cleansed;Debridement (Selective)  Selective Debridement  Selective Debridement - Location Rt posterior wound  Selective Debridement - Tools Used Forceps  Selective Debridement - Tissue  Removed dry skin   Wound Therapy - Assess/Plan/Recommendations  Wound Therapy - Clinical Statement Reassessment complete, pt given referral paperwork for knee high compression hose. No wound remains on Lt LE and wound decreased in size for pt to be able care at home with.  No more skilled intervention necessary.  Reviewed techniques to care for wound on Rt posterior calf, pt able to verbalize care and importance of compression hose for edema control with application of lotion to reduce dry skin.    Wound Plan D/C to HEP.  Pt to get compression hose    Selective Debridement Selective Debridement - Location: Rt posterior wound Selective Debridement - Tools Used: Forceps Selective Debridement - Tissue Removed: dry skin    Physical Therapy Assessment and Plan Wound Therapy - Assess/Plan/Recommendations Wound Therapy - Clinical Statement: Reassessment complete, pt given referral paperwork for knee high compression hose. No wound remains on Lt LE and wound decreased in size for pt to be able care at home with.  No more skilled intervention necessary.  Reviewed techniques to care for wound on Rt posterior calf, pt able to verbalize care and importance of compression hose for edema control with application of lotion to reduce dry skin.   Wound Plan: D/C to HEP.  Pt to get compression hose      Goals Wound Therapy Goals - Improve the function of patient's integumentary system by progressing the wound(s) through the phases of wound healing by: Decrease Length/Width/Depth by (cm): STG:  wounds decreased by 50%;   LTG wounds to be healed as the pt has had chronic hx of wounds for over a year now. Decrease Length/Width/Depth - Progress: Met Patient/Family will be able to : vebalize the importance of  getting new compression stockings and wearing every day. Patient/Family Instruction Goal - Progress: Met Additional Wound Therapy Goal: verbalize the importance of moisturizing daily Additional Wound Therapy  Goal - Progress: Met  Problem List Patient Active Problem List   Diagnosis Date Noted  . 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 Leg wound, left 08/19/2011  . DVT (deep venous thrombosis) 06/15/2011  . H/O PE (pulmonary embolism) 06/15/2011  . 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 IVC obstruction 06/12/2011    GP    [name] 06/12/2014, 3:14 PM

## 2014-06-19 ENCOUNTER — Ambulatory Visit (HOSPITAL_COMMUNITY): Payer: Self-pay | Admitting: Physical Therapy

## 2014-06-26 ENCOUNTER — Ambulatory Visit (HOSPITAL_COMMUNITY): Payer: Self-pay | Admitting: Physical Therapy

## 2014-07-03 ENCOUNTER — Encounter (HOSPITAL_COMMUNITY): Payer: Self-pay | Attending: Oncology

## 2014-07-03 ENCOUNTER — Encounter (HOSPITAL_BASED_OUTPATIENT_CLINIC_OR_DEPARTMENT_OTHER): Payer: Self-pay

## 2014-07-03 ENCOUNTER — Encounter (HOSPITAL_COMMUNITY): Payer: Self-pay

## 2014-07-03 ENCOUNTER — Telehealth (HOSPITAL_COMMUNITY): Payer: Self-pay | Admitting: *Deleted

## 2014-07-03 VITALS — BP 132/86 | HR 59 | Temp 98.1°F | Resp 18 | Wt 293.2 lb

## 2014-07-03 DIAGNOSIS — I82409 Acute embolism and thrombosis of unspecified deep veins of unspecified lower extremity: Secondary | ICD-10-CM | POA: Insufficient documentation

## 2014-07-03 DIAGNOSIS — D573 Sickle-cell trait: Secondary | ICD-10-CM

## 2014-07-03 DIAGNOSIS — D6859 Other primary thrombophilia: Secondary | ICD-10-CM

## 2014-07-03 DIAGNOSIS — I871 Compression of vein: Secondary | ICD-10-CM

## 2014-07-03 DIAGNOSIS — F17200 Nicotine dependence, unspecified, uncomplicated: Secondary | ICD-10-CM

## 2014-07-03 DIAGNOSIS — Z7901 Long term (current) use of anticoagulants: Secondary | ICD-10-CM

## 2014-07-03 LAB — CBC WITH DIFFERENTIAL/PLATELET
Basophils Absolute: 0 10*3/uL (ref 0.0–0.1)
Basophils Relative: 1 % (ref 0–1)
Eosinophils Absolute: 0.1 10*3/uL (ref 0.0–0.7)
Eosinophils Relative: 2 % (ref 0–5)
HCT: 46.7 % (ref 39.0–52.0)
HEMOGLOBIN: 16.5 g/dL (ref 13.0–17.0)
LYMPHS ABS: 1.9 10*3/uL (ref 0.7–4.0)
Lymphocytes Relative: 45 % (ref 12–46)
MCH: 30.8 pg (ref 26.0–34.0)
MCHC: 35.3 g/dL (ref 30.0–36.0)
MCV: 87.3 fL (ref 78.0–100.0)
MONOS PCT: 12 % (ref 3–12)
Monocytes Absolute: 0.5 10*3/uL (ref 0.1–1.0)
NEUTROS ABS: 1.8 10*3/uL (ref 1.7–7.7)
NEUTROS PCT: 42 % — AB (ref 43–77)
Platelets: 192 10*3/uL (ref 150–400)
RBC: 5.35 MIL/uL (ref 4.22–5.81)
RDW: 15.2 % (ref 11.5–15.5)
WBC: 4.4 10*3/uL (ref 4.0–10.5)

## 2014-07-03 LAB — PROTIME-INR
INR: 1.22 (ref 0.00–1.49)
PROTHROMBIN TIME: 15.4 s — AB (ref 11.6–15.2)

## 2014-07-03 LAB — D-DIMER, QUANTITATIVE: D-Dimer, Quant: 0.32 ug/mL-FEU (ref 0.00–0.48)

## 2014-07-03 MED ORDER — OXYCODONE HCL 5 MG PO CAPS: 10.0000 mg | ORAL_CAPSULE | ORAL | Status: DC | PRN

## 2014-07-03 NOTE — Patient Instructions (Signed)
[facility] Discharge Instructions  RECOMMENDATIONS MADE BY THE CONSULTANT AND ANY TEST RESULTS WILL BE SENT TO YOUR REFERRING PHYSICIAN.  EXAM FINDINGS BY THE PHYSICIAN TODAY AND SIGNS OR SYMPTOMS TO REPORT TO CLINIC OR PRIMARY PHYSICIAN: Exam and findings as discussed by Dr.Formanek.  MEDICATIONS PRESCRIBED:  Continue as prescribed. Coumadin 12.5mg  alternating with 10mg  every other day unless otherwise instructed after today's lab result.  INSTRUCTIONS/FOLLOW-UP: Return to clinic for lab work every 4 weeks. Return to clinic in to see MD in 3 months. Report any issues/concerns to clinic as needed prior to appointments.  Thank you for choosing [facility] to provide your oncology and hematology care.  To afford each patient quality time with our providers, please arrive at least 15 minutes before your scheduled appointment time.  With your help, our goal is to use those 15 minutes to complete the necessary work-up to ensure our physicians have the information they need to help with your evaluation and healthcare recommendations.    Effective January 1st, 2014, we ask that you re-schedule your appointment with our physicians should you arrive 10 or more minutes late for your appointment.  We strive to give you quality time with our providers, and arriving late affects you and other patients whose appointments are after yours.    Again, thank you for choosing [facility].  Our hope is that these requests will decrease the amount of time that you wait before being seen by our physicians.       _____________________________________________________________  Should you have questions after your visit to [facility], please contact our office at [phone] between the hours of 8:30 a.m. and 4:30 p.m.  Voicemails left after 4:30 p.m. will not be returned until the following business day.  For prescription refill requests, have your  pharmacy contact our office with your prescription refill request.    _______________________________________________________________  We hope that we have given you very good care.  You may receive a patient satisfaction survey in the mail, please complete it and return it as soon as possible.  We value your feedback!  _______________________________________________________________  Have you asked about our STAR program?  STAR stands for Survivorship Training and Rehabilitation, and this is a nationally recognized cancer care program that focuses on survivorship and rehabilitation.  Cancer and cancer treatments may cause problems, such as, pain, making you feel tired and keeping you from doing the things that you need or want to do. Cancer rehabilitation can help. Our goal is to reduce these troubling effects and help you have the best quality of life possible.  You may receive a survey from a nurse that asks questions about your current state of health.  Based on the survey results, all eligible patients will be referred to the [facility] program for an evaluation so we can better serve you!  A frequently asked questions sheet is available upon request.

## 2014-07-03 NOTE — Telephone Encounter (Signed)
Spoke with patient.instruct to take warfarin 12.5mg  daily. RTC 07/17/14 for pt/inr. Patient verbalized understanding.

## 2014-07-03 NOTE — Progress Notes (Signed)
[facility]  OFFICE PROGRESS NOTE  [name] MD [place] [address]  DIAGNOSIS: DVT (deep venous thrombosis), unspecified laterality - Plan: oxycodone (OXY-IR) 5 MG capsule  IVC (inferior vena cava obstruction)  Chief Complaint  Patient presents with  . Thrombophilia    CURRENT THERAPY: Oral anticoagulation  INTERVAL HISTORY: Matthew Wise 53 y.o. 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  He remains content taking warfarin versus Xarelto. Appetite is good with no epistaxis, melena, hematochezia, hematuria, or hemoptysis. He also denies any easy bruising. Appetite is good with no chest pain, PND, orthopnea, palpitations, or worsening lower extremity swelling. He is very compliant with wearing TED hose bilaterally. Taking warfarin 10 mg alternating with 12.5 mg daily.  MEDICAL HISTORY: Past Medical History  Diagnosis Date  . DVT of leg (deep venous thrombosis)     rt. leg  . Inferior vena caval thrombosis     Chronic  . DVT (deep venous thrombosis) 06/15/2011  . H/O PE (pulmonary embolism) 06/15/2011  . 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    ALLERGIES:  has No Known Allergies.  MEDICATIONS: has a current medication list which includes the following prescription(s): naproxen, oxycodone, and warfarin.  SURGICAL HISTORY: History reviewed. No pertinent past surgical history.  FAMILY HISTORY: family history includes COPD in  his father; Cancer in his brother; Stroke in his brother.  SOCIAL HISTORY:  reports that he has been smoking Cigarettes.  He has a 30 pack-year smoking history. He has never used smokeless tobacco. He reports that he drinks about 14.4 ounces of alcohol per week. He reports that he does not use illicit drugs.  REVIEW OF SYSTEMS:  Other than that discussed above is noncontributory.  PHYSICAL EXAMINATION: ECOG PERFORMANCE STATUS: 1 - Symptomatic but completely ambulatory  Blood pressure 132/86, pulse 59, temperature 98.1 F (36.7 C), temperature source Oral, resp. rate 18, weight 293 lb 3.2 oz (132.995 kg).  GENERAL:alert, no distress and comfortable SKIN: skin color, texture, turgor are normal, no rashes or significant lesions EYES: PERLA; Conjunctiva are pink and non-injected, sclera clear SINUSES: No redness or tenderness over maxillary or ethmoid sinuses OROPHARYNX:no exudate, no erythema on lips, buccal mucosa, or tongue. NECK: supple, thyroid normal size, non-tender, without nodularity. No masses CHEST: Normal AP diameter with no breast masses. LYMPH:  no palpable lymphadenopathy in the cervical, axillary or inguinal LUNGS: clear to auscultation and percussion with normal breathing effort HEART: regular rate & rhythm and no murmurs. P2 is tolerable at the apex. ABDOMEN:abdomen soft, non-tender and normal bowel sounds MUSCULOSKELETAL:no cyanosis of digits and no clubbing. Range of motion normal. Bilateral lower 70 swelling with negative Homans sign. NEURO: alert & oriented x 3 with fluent speech, no focal motor/sensory deficits   LABORATORY DATA: Lab on 07/03/2014  Component Date Value Ref Range Status  . Prothrombin Time 07/03/2014 15.4* 11.6 - 15.2 seconds Final  . INR 07/03/2014 1.22  0.00 - 1.49 Final  . WBC 07/03/2014 4.4  4.0 - 10.5 K/uL Final  . RBC 07/03/2014 5.35  4.22 - 5.81 MIL/uL Final  . Hemoglobin 07/03/2014 16.5  13.0 - 17.0 g/dL Final  . HCT 07/03/2014 46.7  39.0  - 52.0 % Final  . MCV 07/03/2014 87.3  78.0 - 100.0 fL Final  . MCH 07/03/2014 30.8  26.0 - 34.0 pg Final  . MCHC 07/03/2014 35.3  30.0 - 36.0 g/dL Final  . RDW 07/03/2014 15.2  11.5 - 15.5 % Final  . Platelets 07/03/2014 192  150 - 400 K/uL Final  . Neutrophils Relative % 07/03/2014 42* 43 - 77 % Final  . Neutro Abs 07/03/2014 1.8  1.7 - 7.7 K/uL Final  . Lymphocytes Relative 07/03/2014 45  12 - 46 % Final  . Lymphs Abs 07/03/2014 1.9  0.7 - 4.0 K/uL Final  . Monocytes Relative 07/03/2014 12  3 - 12 % Final  . Monocytes Absolute 07/03/2014 0.5  0.1 - 1.0 K/uL Final  . Eosinophils Relative 07/03/2014 2  0 - 5 % Final  . Eosinophils Absolute 07/03/2014 0.1  0.0 - 0.7 K/uL Final  . Basophils Relative 07/03/2014 1  0 - 1 % Final  . Basophils Absolute 07/03/2014 0.0  0.0 - 0.1 K/uL Final  . D-Dimer, Quant 07/03/2014 0.32  0.00 - 0.48 ug/mL-FEU Final   Comment:                                 AT THE INHOUSE ESTABLISHED CUTOFF                          VALUE OF 0.48 ug/mL FEU,                          THIS ASSAY HAS BEEN DOCUMENTED                          IN THE LITERATURE TO HAVE                          A SENSITIVITY AND NEGATIVE                          PREDICTIVE VALUE OF AT LEAST                          98 TO 99%.  THE TEST RESULT                          SHOULD BE CORRELATED WITH                          AN ASSESSMENT OF THE CLINICAL                          PROBABILITY OF DVT / VTE.    PATHOLOGY: No new pathology.  Urinalysis    Component Value Date/Time   COLORURINE YELLOW 12/10/2008 [place] 12/10/2008 0743   LABSPEC 1.017 12/10/2008 0743   PHURINE 5.5 12/10/2008 0743   GLUCOSEU NEGATIVE 12/10/2008 0743   HGBUR NEGATIVE 12/10/2008 0743   BILIRUBINUR NEGATIVE 12/10/2008 0743   KETONESUR NEGATIVE 12/10/2008 0743   PROTEINUR NEGATIVE 12/10/2008 0743   UROBILINOGEN 1.0 12/10/2008 0743  NITRITE NEGATIVE 12/10/2008 0743   LEUKOCYTESUR NEGATIVE MICROSCOPIC NOT DONE  ON URINES WITH NEGATIVE PROTEIN, BLOOD, LEUKOCYTES, NITRITE, OR GLUCOSE <1000 mg/dL. 12/10/2008 0743    RADIOGRAPHIC STUDIES: No results found.  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 Patient seen by Dr[facility] Christoper Allegra at [facility] who found that he also has sickle cell trait which increases the patient's risk of DVTs. He did not have cardiolipin antibodies, nor beta-2 glycoprotein antibodies. It is believed by both, Dr. Mare Ferrari and [place], that he is not a good candidate for ongoing work and is a candidate for permanent disability  #2. Continued tobacco use     PLAN:  #1. Monthly INR with adjustment and warfarin dose based upon result. Increase dose to 12.5 mg daily and repeat INR in 2 weeks. #2. Followup in 3 months with CBC, INR.   All questions were answered. The patient knows to call the clinic with any problems, questions or concerns. We can certainly see the patient much sooner if necessary.   I spent 25 minutes counseling the patient face to face. The total time spent in the appointment was 30 minutes.    Doroteo Bradford, MD 07/03/2014 10:18 AM  DISCLAIMER:  This note was dictated with voice recognition software.  Similar sounding words can inadvertently be transcribed inaccurately and may not be corrected upon review.

## 2014-07-03 NOTE — Telephone Encounter (Signed)
Message copied by Berneta Levins on Tue Jul 03, 2014  4:05 PM ------      Message from: Farrel Gobble A      Created: Tue Jul 03, 2014 10:09 AM       Please inform Mr. Arpino to take 12.5 mg of warfarin daily and have INR checked again in 2 weeks. Thanks ------

## 2014-07-03 NOTE — Progress Notes (Signed)
LABS FOR DDIM,PT,CBCD

## 2014-07-17 ENCOUNTER — Encounter (HOSPITAL_BASED_OUTPATIENT_CLINIC_OR_DEPARTMENT_OTHER): Payer: Self-pay

## 2014-07-17 ENCOUNTER — Telehealth (HOSPITAL_COMMUNITY): Payer: Self-pay

## 2014-07-17 DIAGNOSIS — D6859 Other primary thrombophilia: Secondary | ICD-10-CM

## 2014-07-17 DIAGNOSIS — I871 Compression of vein: Secondary | ICD-10-CM

## 2014-07-17 DIAGNOSIS — I82409 Acute embolism and thrombosis of unspecified deep veins of unspecified lower extremity: Secondary | ICD-10-CM

## 2014-07-17 LAB — PROTIME-INR
INR: 2.71 — ABNORMAL HIGH (ref 0.00–1.49)
PROTHROMBIN TIME: 28.8 s — AB (ref 11.6–15.2)

## 2014-07-17 NOTE — Progress Notes (Signed)
LABS FOR PT

## 2014-07-17 NOTE — Telephone Encounter (Signed)
Patient notified to continue coumadin 12.5 mg and to return for next INR in 4 weeks.  Verbalized understanding.

## 2014-07-17 NOTE — Telephone Encounter (Signed)
Message copied by Mellissa Kohut on Tue Jul 17, 2014  1:22 PM ------      Message from: Farrel Gobble A      Created: Tue Jul 17, 2014 10:17 AM       Please inform patient to remain on the same dose of warfarin. Followup PT in 4 weeks. Thank you ------

## 2014-07-31 ENCOUNTER — Encounter (HOSPITAL_COMMUNITY): Payer: Self-pay

## 2014-08-14 ENCOUNTER — Encounter (HOSPITAL_COMMUNITY): Payer: Self-pay | Attending: Oncology

## 2014-08-14 DIAGNOSIS — S81802D Unspecified open wound, left lower leg, subsequent encounter: Secondary | ICD-10-CM

## 2014-08-14 DIAGNOSIS — I871 Compression of vein: Secondary | ICD-10-CM | POA: Insufficient documentation

## 2014-08-14 DIAGNOSIS — I82409 Acute embolism and thrombosis of unspecified deep veins of unspecified lower extremity: Secondary | ICD-10-CM | POA: Insufficient documentation

## 2014-08-14 LAB — PROTIME-INR
INR: 2.01 — AB (ref 0.00–1.49)
Prothrombin Time: 22.8 seconds — ABNORMAL HIGH (ref 11.6–15.2)

## 2014-08-14 MED ORDER — OXYCODONE HCL 5 MG PO CAPS: 10.0000 mg | ORAL_CAPSULE | ORAL | Status: DC | PRN

## 2014-08-14 NOTE — Progress Notes (Signed)
LABS FOR PT

## 2014-08-14 NOTE — Progress Notes (Signed)
Patient notified of current lab results.  Advised to cont. Current dose of coumadin.  Pt verbalized understanding.

## 2014-08-16 ENCOUNTER — Ambulatory Visit (HOSPITAL_COMMUNITY): Admission: RE | Admit: 2014-08-16 | Payer: Self-pay | Source: Ambulatory Visit

## 2014-08-21 ENCOUNTER — Ambulatory Visit (HOSPITAL_COMMUNITY)
Admission: RE | Admit: 2014-08-21 | Discharge: 2014-08-21 | Disposition: A | Discharge status: Home / Self Care | Payer: Self-pay | Source: Ambulatory Visit | Attending: Hematology and Oncology | Admitting: Hematology and Oncology

## 2014-08-21 DIAGNOSIS — Z7901 Long term (current) use of anticoagulants: Secondary | ICD-10-CM | POA: Insufficient documentation

## 2014-08-21 DIAGNOSIS — I825Z9 Chronic embolism and thrombosis of unspecified deep veins of unspecified distal lower extremity: Secondary | ICD-10-CM | POA: Insufficient documentation

## 2014-08-21 DIAGNOSIS — L97809 Non-pressure chronic ulcer of other part of unspecified lower leg with unspecified severity: Secondary | ICD-10-CM | POA: Insufficient documentation

## 2014-08-21 DIAGNOSIS — M25469 Effusion, unspecified knee: Secondary | ICD-10-CM | POA: Insufficient documentation

## 2014-08-21 DIAGNOSIS — I872 Venous insufficiency (chronic) (peripheral): Secondary | ICD-10-CM | POA: Insufficient documentation

## 2014-08-21 DIAGNOSIS — IMO0001 Reserved for inherently not codable concepts without codable children: Secondary | ICD-10-CM | POA: Insufficient documentation

## 2014-08-21 NOTE — Progress Notes (Signed)
Physical Therapy - Wound Therapy  Evaluation   Patient Details  Name: Matthew Wise MRN: [id number] Date of Birth: 1961/07/24  Today's Date: 08/21/2014 Time: 7169-6789 Time Calculation (min): 45 min   Charges: 1 Eval,, Selective debridement <20cm Visit#: 1 of 16  Re-eval: 09/20/14  Subjective Subjective Assessment Subjective: Patient returns for treatment of bilateral LE wounds with wounds returning despited patient statign he has continued to wear his compression garments though the patient presented to therapy without compression garments.  Patient and Family Stated Goals: wounds to heal Date of Onset: 07/31/14 Prior Treatments: self care.  Pt is a known Pt who does not have a hx of good compliance.  Pt was seen in January 2014 thru April when he quit coming to therapy prior to his wounds being healed. Patient was again seen this pring of 2015 for wound care durign which wounds completely heeled  The wounds got worse and he returned to therapy in June.  He was seen inconsistently until August when once again Matthew Wise quit coming prior to his wounds being healed.    Pain Assessment Pain Assessment Pain Assessment: 0-10 Pain Score: 4  Pain Type: Chronic pain Pain Location: Leg Pain Orientation: Right;Left;Posterior Pain Descriptors / Indicators: Aching;Constant Patients Stated Pain Goal: 0  Wound Therapy Wound / Incision (Open or Dehisced) 03/08/14 Other (Comment) Leg Right;Posterior (Active)  Dressing Type Other (Comment) 06/12/2014  3:01 PM  Dressing Changed New 05/08/2014  2:37 PM  Dressing Status Clean;Dry 06/12/2014  3:01 PM  Dressing Change Frequency Monday, Wednesday, Friday 03/19/2014  3:11 PM  Site / Wound Assessment Dry;Granulation tissue;Red;Pink 06/12/2014  3:01 PM  % Wound base Red or Granulating 95% 06/12/2014  3:01 PM  % Wound base Yellow 5% 06/12/2014  3:01 PM  % Wound base Black 0% 06/12/2014  3:01 PM  Peri-wound Assessment Intact;Edema 06/12/2014  3:01 PM  Wound  Length (cm) 0.8 cm 06/12/2014  3:01 PM  Wound Width (cm) 0.1 cm 06/12/2014  3:01 PM  Wound Depth (cm) 0 cm 06/12/2014  3:01 PM  Margins Unattached edges (unapproximated) 03/08/2014  2:27 PM  Closure None 06/12/2014  3:01 PM  Drainage Amount None 06/12/2014  3:01 PM  Drainage Description Serous 05/29/2014  3:18 PM  Treatment Cleansed;Debridement (Selective) 06/12/2014  3:01 PM     Wound / Incision (Open or Dehisced) 08/21/14 Other (Comment) Leg Posterior;Right;Medial dry skin around periwound minimal drainage (Active)  Dressing Type Compression wrap 08/21/2014  6:21 PM  Dressing Changed New 08/21/2014  6:21 PM  Dressing Status Clean 08/21/2014  6:21 PM  Dressing Change Frequency Every 3 days 08/21/2014  6:21 PM  Site / Wound Assessment Clean;Dry;Bleeding;Black;Granulation tissue;Pale 08/21/2014  6:21 PM  % Wound base Red or Granulating 40% 08/21/2014  6:21 PM  % Wound base Yellow 40% 08/21/2014  6:21 PM  % Wound base Black 20% 08/21/2014  6:21 PM  Peri-wound Assessment Edema 08/21/2014  6:21 PM  Wound Length (cm) 10.4 cm 08/21/2014  6:21 PM  Wound Width (cm) 4 cm 08/21/2014  6:21 PM  Wound Depth (cm) 0.1 cm 08/21/2014  6:21 PM  Margins Unattached edges (unapproximated) 08/21/2014  6:21 PM  Closure None 08/21/2014  6:21 PM  Drainage Amount Minimal 08/21/2014  6:21 PM  Drainage Description Serosanguineous 08/21/2014  6:21 PM  Treatment Cleansed;Debridement (Selective) 08/21/2014  6:21 PM     Wound / Incision (Open or Dehisced) 08/21/14 Other (Comment) Leg Left;Posterior dry periwound with cracking skin, minimal drainage (Active)  Dressing Type Compression wrap 08/21/2014  6:21 PM  Dressing Changed New 08/21/2014  6:21 PM  Dressing Status Clean 08/21/2014  6:21 PM  Dressing Change Frequency Every 3 days 08/21/2014  6:21 PM  Site / Wound Assessment Clean;Dry;Bleeding;Black;Granulation tissue;Yellow 08/21/2014  6:21 PM  % Wound base Red or Granulating 40% 08/21/2014  6:21 PM  % Wound base Yellow 40% 08/21/2014  6:21 PM   % Wound base Black 20% 08/21/2014  6:21 PM  Peri-wound Assessment Black;Bleeding 08/21/2014  6:21 PM  Wound Length (cm) 8.5 cm 08/21/2014  6:21 PM  Wound Width (cm) 1.5 cm 08/21/2014  6:21 PM  Wound Depth (cm) 0.2 cm 08/21/2014  6:21 PM  Margins Unattached edges (unapproximated) 08/21/2014  6:21 PM  Closure None 08/21/2014  6:21 PM  Drainage Amount Minimal 08/21/2014  6:21 PM  Drainage Description Serosanguineous 08/21/2014  6:21 PM  Treatment Cleansed;Debridement (Selective) 08/21/2014  6:21 PM     Wound / Incision (Open or Dehisced) 08/21/14 Other (Comment) Leg Left;Medial minmal drainage, cracking skin, along medical shin, just inferior medial to other Lt LE wound (Active)  Dressing Type Compression wrap 08/21/2014  6:21 PM  Dressing Changed New 08/21/2014  6:21 PM  Dressing Status Clean;Dry 08/21/2014  6:21 PM  Dressing Change Frequency Every 3 days 08/21/2014  6:21 PM  Site / Wound Assessment Dry;Clean;Bleeding;Black;Granulation tissue;Yellow 08/21/2014  6:21 PM  % Wound base Red or Granulating 45% 08/21/2014  6:21 PM  % Wound base Yellow 40% 08/21/2014  6:21 PM  % Wound base Black 15% 08/21/2014  6:21 PM  Peri-wound Assessment Black;Bleeding 08/21/2014  6:21 PM  Wound Length (cm) 5.5 cm 08/21/2014  6:21 PM  Wound Width (cm) 3.7 cm 08/21/2014  6:21 PM  Wound Depth (cm) 0.2 cm 08/21/2014  6:21 PM  Margins Unattached edges (unapproximated) 08/21/2014  6:21 PM  Closure None 08/21/2014  6:21 PM  Drainage Amount Minimal 08/21/2014  6:21 PM  Drainage Description Serosanguineous 08/21/2014  6:21 PM  Treatment Cleansed;Debridement (Selective) 08/21/2014  6:21 PM   Selective Debridement Selective Debridement - Location: all three sites Selective Debridement - Tools Used: Forceps;Scalpel;Scissors Selective Debridement - Tissue Removed: dry skin, slough, eschar   Physical Therapy Assessment and Plan Wound Therapy - Assess/Plan/Recommendations Wound Therapy - Clinical Statement: Patient displays woundsl on  bilateral LE that had since healed but have reoccurred attributed to chronic venous insufficiency. Patient was advised on use of compression garments, and to see MD to determine if any further medical action would be necessary to improve LE circulation.  Factors Delaying/Impairing Wound Healing: Infection - systemic/local;Vascular compromise;Altered sensation Hydrotherapy Plan: Debridement;Dressing change;Patient/family education Wound Therapy - Frequency: Other (comment) (2x a week) Wound Plan: Phsyical therapy to perform wound care 2x a week until wound has fully healed. Continue to educated patient on use of compression garmets and follwo-up with MD with regards to possible increased vascular insufficiency.       Goals Wound Therapy Goals - Improve the function of patient's integumentary system by progressing the wound(s) through the phases of wound healing by: Decrease Necrotic Tissue to: 0 Decrease Necrotic Tissue - Progress: Goal set today Increase Granulation Tissue to: 100 Increase Granulation Tissue - Progress: Goal set today Decrease Length/Width/Depth by (cm): STG:  wounds decreased by 50%;   LTG wounds to be healed as the pt has had chronic hx of wounds for over a year now. Decrease Length/Width/Depth - Progress: Goal set today Improve Drainage Characteristics: Mod Improve Drainage Characteristics - Progress: Goal set today Patient/Family will be able to : recall previosu education  and state following home care guidelines. Patient/Family Instruction Goal - Progress: Partly met  Problem List Patient Active Problem List   Diagnosis Date Noted  . 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 Leg wound, left 08/19/2011  . DVT (deep venous thrombosis) 06/15/2011  . H/O PE (pulmonary embolism) 06/15/2011  . 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

## 2014-08-24 ENCOUNTER — Ambulatory Visit (HOSPITAL_COMMUNITY)
Admission: RE | Admit: 2014-08-24 | Discharge: 2014-08-24 | Disposition: A | Discharge status: Home / Self Care | Payer: Self-pay | Source: Ambulatory Visit | Attending: Oncology | Admitting: Oncology

## 2014-08-24 NOTE — Progress Notes (Signed)
Physical Therapy - Wound Therapy  Treatment   Patient Details  Name: Matthew Wise MRN: [id number] Date of Birth: [birth year]  Today's Date: 08/24/2014 Time: 4627-0350 Time Calculation (min): 55 min Charge: selective debridement< 20cm  Visit#: 2 of 16  Re-eval: 09/20/14  Subjective Subjective Assessment Subjective: Pt came into dept with compression wrap removed, stated it slid down LE.  Has wouinds covered with gauze and tape.    Pain Assessment Pain Assessment Pain Score: 5  Pain Location: Leg Pain Orientation: Right;Left;Anterior;Posterior  Wound Therapy  08/24/14 1858  Subjective Assessment  Subjective Pt came into dept with compression wrap removed, stated it slid down LE.  Has wouinds covered with gauze and tape.    Wound / Incision (Open or Dehisced)  Date First Assessed/Time First Assessed: 08/21/14 1300   Wound Type: (c) Other (Comment)  Location: Leg  Location Orientation: Posterior;Right;Medial  Wound Description (Comments): dry skin around periwound minimal drainage  Present on Admission: Yes  Dressing Type Impregnated gauze (bismuth);Compression wrap (Profore)  Dressing Changed New  Dressing Status Clean;Dry;Intact  Site / Wound Assessment Clean;Dry;Bleeding;Black;Granulation tissue;Pale  % Wound base Red or Granulating 50%  % Wound base Yellow 35%  % Wound base Black 15%  Margins Unattached edges (unapproximated)  Closure None  Drainage Amount Scant  Drainage Description Serosanguineous  Treatment Cleansed;Debridement (Selective)  Wound / Incision (Open or Dehisced)  Date First Assessed/Time First Assessed: 08/21/14 1305   Wound Type: (c) Other (Comment)  Location: Leg  Location Orientation: Left;Posterior  Wound Description (Comments): dry periwound with cracking skin, minimal drainage  Present on Admission: Yes  Dressing Type Compression wrap;Impregnated gauze (bismuth) (Profore)  Dressing Changed New  Dressing Status Clean;Intact;Dry  % Wound base  Red or Granulating 50%  % Wound base Yellow 35%  % Wound base Black 15%  Margins Unattached edges (unapproximated)  Closure None  Drainage Amount Scant  Drainage Description Serosanguineous  Treatment Cleansed;Debridement (Selective)  Wound / Incision (Open or Dehisced)  Date First Assessed/Time First Assessed: 08/21/14 1310   Wound Type: (c) Other (Comment)  Location: Leg  Location Orientation: Left;Medial  Wound Description (Comments): minmal drainage, cracking skin, along medical shin, just inferior medial to other Lt  Dressing Type Compression wrap;Impregnated gauze (bismuth) (Profore)  Dressing Changed New  Dressing Status Clean;Dry;Intact  % Wound base Red or Granulating 50%  % Wound base Yellow 40%  % Wound base Black 10%  Margins Unattached edges (unapproximated)  Closure None  Drainage Amount Scant  Drainage Description Serosanguineous  Treatment Cleansed;Debridement (Selective)  Selective Debridement  Selective Debridement - Location all three sites  Selective Debridement - Tools Used Forceps;Scalpel  Selective Debridement - Tissue Removed dry skin, slough, eschar  Wound Therapy - Assess/Plan/Recommendations  Wound Therapy - Clinical Statement Selective debridment complete for removal of slough, yellow and black eschar and dry skin perimeter of wounds to promote healing.  Continued with xeroform and vaseline on LE to add moisture to dry skin to promote healing.  Profore wrap for edema control.    Factors Delaying/Impairing Wound Healing Infection - systemic/local;Vascular compromise;Altered sensation  Hydrotherapy Plan Debridement;Dressing change;Patient/family education  Wound Therapy - Frequency Other (comment)  Wound Plan Phsyical therapy to perform wound care 2x a week until wound has fully healed. Continue to educated patient on use of compression garmets and follwo-up with MD with regards to possible increased vascular insufficiency.     Selective  Debridement Selective Debridement - Location: all three sites Selective Debridement - Tools Used: Forceps;Scalpel Selective Debridement -  Tissue Removed: dry skin, slough, eschar   Physical Therapy Assessment and Plan Wound Therapy - Assess/Plan/Recommendations Wound Therapy - Clinical Statement: Selective debridment complete for removal of slough, yellow and black eschar and dry skin perimeter of wounds to promote healing.  Continued with xeroform and vaseline on LE to add moisture to dry skin to promote healing.  Profore wrap for edema control.   Factors Delaying/Impairing Wound Healing: Infection - systemic/local;Vascular compromise;Altered sensation Hydrotherapy Plan: Debridement;Dressing change;Patient/family education Wound Therapy - Frequency: Other (comment) Wound Plan: Phsyical therapy to perform wound care 2x a week until wound has fully healed. Continue to educated patient on use of compression garmets and follwo-up with MD with regards to possible increased vascular insufficiency.       Goals    Problem List Patient Active Problem List   Diagnosis Date Noted  . 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 Leg wound, left 08/19/2011  . DVT (deep venous thrombosis) 06/15/2011  . H/O PE (pulmonary embolism) 06/15/2011  . 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

## 2014-08-28 ENCOUNTER — Encounter (HOSPITAL_BASED_OUTPATIENT_CLINIC_OR_DEPARTMENT_OTHER): Payer: Self-pay

## 2014-08-28 ENCOUNTER — Ambulatory Visit (HOSPITAL_COMMUNITY)
Admission: RE | Admit: 2014-08-28 | Discharge: 2014-08-28 | Disposition: A | Discharge status: Home / Self Care | Payer: Self-pay | Source: Ambulatory Visit | Attending: Oncology | Admitting: Oncology

## 2014-08-28 DIAGNOSIS — I871 Compression of vein: Secondary | ICD-10-CM

## 2014-08-28 DIAGNOSIS — I82409 Acute embolism and thrombosis of unspecified deep veins of unspecified lower extremity: Secondary | ICD-10-CM

## 2014-08-28 LAB — PROTIME-INR
INR: 1.43 (ref 0.00–1.49)
Prothrombin Time: 17.5 seconds — ABNORMAL HIGH (ref 11.6–15.2)

## 2014-08-28 NOTE — Progress Notes (Signed)
LABS FOR PT

## 2014-08-28 NOTE — Progress Notes (Signed)
Physical Therapy - Wound Therapy  Treatment   Patient Details  Name: Matthew Wise MRN: [id number] Date of Birth: 1961-08-26  Today's Date: 08/28/2014 Time: 1515-1600 Time Calculation (min): 45 min    Selective debridement < 20 cm, Self care 1550-1600 Visit#: 3 of 16  Re-eval: 09/20/14  Subjective Subjective Assessment Subjective: Not wearing profore wraps from last session due to wanting to take them off before coming in today. Patient states having velcrow wraps, (was instructed to bring them in) but does not wear them regularly  Pain Assessment Pain Assessment Pain Score: 4  Pain Type: Chronic pain Pain Location: Leg  Wound Therapy Wound / Incision (Open or Dehisced) 03/08/14 Other (Comment) Leg Right;Posterior (Active)  Dressing Type Other (Comment) 06/12/2014  3:01 PM  Dressing Changed New 05/08/2014  2:37 PM  Dressing Status Clean;Dry 06/12/2014  3:01 PM  Dressing Change Frequency Monday, Wednesday, Friday 03/19/2014  3:11 PM  Site / Wound Assessment Dry;Granulation tissue;Red;Pink 06/12/2014  3:01 PM  % Wound base Red or Granulating 95% 06/12/2014  3:01 PM  % Wound base Yellow 5% 06/12/2014  3:01 PM  % Wound base Black 0% 06/12/2014  3:01 PM  Peri-wound Assessment Intact;Edema 06/12/2014  3:01 PM  Wound Length (cm) 0.8 cm 06/12/2014  3:01 PM  Wound Width (cm) 0.1 cm 06/12/2014  3:01 PM  Wound Depth (cm) 0 cm 06/12/2014  3:01 PM  Margins Unattached edges (unapproximated) 03/08/2014  2:27 PM  Closure None 06/12/2014  3:01 PM  Drainage Amount None 06/12/2014  3:01 PM  Drainage Description Serous 05/29/2014  3:18 PM  Treatment Cleansed;Debridement (Selective) 06/12/2014  3:01 PM     Wound / Incision (Open or Dehisced) 08/21/14 Other (Comment) Leg Posterior;Right;Medial dry skin around periwound minimal drainage (Active)  Dressing Type Impregnated gauze (bismuth);Compression wrap 08/28/2014  4:10 PM  Dressing Changed New 08/24/2014  6:58 PM  Dressing Status Clean;Dry;Intact 08/28/2014   4:10 PM  Dressing Change Frequency Every 3 days 08/21/2014  6:21 PM  Site / Wound Assessment Clean;Dry;Bleeding;Black;Granulation tissue;Pale 08/28/2014  4:10 PM  % Wound base Red or Granulating 50% 08/28/2014  4:10 PM  % Wound base Yellow 35% 08/28/2014  4:10 PM  % Wound base Black 15% 08/28/2014  4:10 PM  Peri-wound Assessment Edema 08/21/2014  6:21 PM  Wound Length (cm) 10.4 cm 08/21/2014  6:21 PM  Wound Width (cm) 4 cm 08/21/2014  6:21 PM  Wound Depth (cm) 0.1 cm 08/21/2014  6:21 PM  Margins Unattached edges (unapproximated) 08/28/2014  4:10 PM  Closure None 08/28/2014  4:10 PM  Drainage Amount Scant 08/28/2014  4:10 PM  Drainage Description Serosanguineous 08/28/2014  4:10 PM  Treatment Cleansed;Debridement (Selective) 08/28/2014  4:10 PM     Wound / Incision (Open or Dehisced) 08/21/14 Other (Comment) Leg Left;Posterior dry periwound with cracking skin, minimal drainage (Active)  Dressing Type Compression wrap;Impregnated gauze (bismuth) 08/28/2014  4:10 PM  Dressing Changed New 08/24/2014  6:58 PM  Dressing Status Clean;Intact;Dry 08/28/2014  4:10 PM  Dressing Change Frequency Every 3 days 08/21/2014  6:21 PM  Site / Wound Assessment Clean;Dry;Bleeding;Black;Granulation tissue;Yellow 08/21/2014  6:21 PM  % Wound base Red or Granulating 50% 08/28/2014  4:10 PM  % Wound base Yellow 35% 08/28/2014  4:10 PM  % Wound base Black 15% 08/28/2014  4:10 PM  Peri-wound Assessment Black;Bleeding 08/21/2014  6:21 PM  Wound Length (cm) 8.5 cm 08/21/2014  6:21 PM  Wound Width (cm) 1.5 cm 08/21/2014  6:21 PM  Wound Depth (cm) 0.2 cm 08/21/2014  6:21 PM  Margins Unattached edges (unapproximated) 08/28/2014  4:10 PM  Closure None 08/28/2014  4:10 PM  Drainage Amount Scant 08/28/2014  4:10 PM  Drainage Description Serosanguineous 08/28/2014  4:10 PM  Treatment Cleansed;Debridement (Selective) 08/28/2014  4:10 PM     Wound / Incision (Open or Dehisced) 08/21/14 Other (Comment) Leg Left;Medial minmal drainage, cracking skin,  along medical shin, just inferior medial to other Lt LE wound (Active)  Dressing Type Compression wrap;Impregnated gauze (bismuth) 08/28/2014  4:10 PM  Dressing Changed New 08/24/2014  6:58 PM  Dressing Status Clean;Dry;Intact 08/28/2014  4:10 PM  Dressing Change Frequency Every 3 days 08/21/2014  6:21 PM  Site / Wound Assessment Dry;Clean;Bleeding;Black;Granulation tissue;Yellow 08/21/2014  6:21 PM  % Wound base Red or Granulating 50% 08/28/2014  4:10 PM  % Wound base Yellow 40% 08/28/2014  4:10 PM  % Wound base Black 10% 08/28/2014  4:10 PM  Peri-wound Assessment Black;Bleeding 08/21/2014  6:21 PM  Wound Length (cm) 5.5 cm 08/21/2014  6:21 PM  Wound Width (cm) 3.7 cm 08/21/2014  6:21 PM  Wound Depth (cm) 0.2 cm 08/21/2014  6:21 PM  Margins Unattached edges (unapproximated) 08/28/2014  4:10 PM  Closure None 08/28/2014  4:10 PM  Drainage Amount Scant 08/28/2014  4:10 PM  Drainage Description Serosanguineous 08/28/2014  4:10 PM  Treatment Cleansed;Debridement (Selective) 08/24/2014  6:58 PM   Selective Debridement Selective Debridement - Location: all three sites Selective Debridement - Tools Used: Forceps;Scalpel Selective Debridement - Tissue Removed: dry skin, slough, eschar   Physical Therapy Assessment and Plan Wound Therapy - Assess/Plan/Recommendations Wound Therapy - Clinical Statement: Selective debridment complete for removal of slough, yellow and black eschar and dry skin perimeter of wounds to promote healing.  Continued with xeroform and vaseline on LE to add moisture to dry skin to promote healing.  Profore wrap for edema control.  Educated patient on importance of use of compression to improve wound healing.  Factors Delaying/Impairing Wound Healing: Infection - systemic/local;Vascular compromise;Altered sensation Hydrotherapy Plan: Debridement;Dressing change;Patient/family education Wound Therapy - Frequency: Other (comment) Wound Plan: Phsyical therapy to perform wound care 2x a week  until wound has fully healed. Continue to educate patient on the improtance of use on use of compression garmets and follwo-up with MD with regards to possible increased vascular insufficiency.       Goals Wound Therapy Goals - Improve the function of patient's integumentary system by progressing the wound(s) through the phases of wound healing by: Decrease Necrotic Tissue to: 0 Decrease Necrotic Tissue - Progress: Progressing toward goal Increase Granulation Tissue to: 100 Increase Granulation Tissue - Progress: Progressing toward goal Decrease Length/Width/Depth by (cm): STG:  wounds decreased by 50%;   LTG wounds to be healed as the pt has had chronic hx of wounds for over a year now. Decrease Length/Width/Depth - Progress: Progressing toward goal Improve Drainage Characteristics: Mod Improve Drainage Characteristics - Progress: Progressing toward goal Patient/Family will be able to : recall previosu education and state following home care guidelines. Patient/Family Instruction Goal - Progress: Partly met  Problem List Patient Active Problem List   Diagnosis Date Noted  . 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 Leg wound, left 08/19/2011  . DVT (deep venous thrombosis) 06/15/2011  . H/O PE (pulmonary embolism) 06/15/2011  . 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

## 2014-08-29 ENCOUNTER — Telehealth (HOSPITAL_COMMUNITY): Payer: Self-pay

## 2014-08-29 NOTE — Telephone Encounter (Signed)
Message left for patient to change  coumadin to 15 mg x 2 days then 12.5mg  alternating with 15 mg daily and to return for next PT/INR on 09/05/14.  Call back confirmation requested.

## 2014-08-30 ENCOUNTER — Telehealth (HOSPITAL_COMMUNITY): Payer: Self-pay

## 2014-08-30 ENCOUNTER — Ambulatory Visit (HOSPITAL_COMMUNITY): Payer: Self-pay | Admitting: Physical Therapy

## 2014-09-03 ENCOUNTER — Ambulatory Visit (HOSPITAL_COMMUNITY)
Admission: RE | Admit: 2014-09-03 | Discharge: 2014-09-03 | Disposition: A | Discharge status: Home / Self Care | Payer: Self-pay | Source: Ambulatory Visit | Attending: Hematology and Oncology | Admitting: Hematology and Oncology

## 2014-09-03 DIAGNOSIS — I82409 Acute embolism and thrombosis of unspecified deep veins of unspecified lower extremity: Secondary | ICD-10-CM | POA: Insufficient documentation

## 2014-09-03 DIAGNOSIS — I871 Compression of vein: Secondary | ICD-10-CM | POA: Insufficient documentation

## 2014-09-03 DIAGNOSIS — D573 Sickle-cell trait: Secondary | ICD-10-CM | POA: Insufficient documentation

## 2014-09-03 NOTE — Progress Notes (Signed)
Physical Therapy - Wound Therapy  Treatment   Patient Details  Name: Matthew Wise MRN: [id number] Date of Birth: 1961/10/27  Today's Date: 09/03/2014 Time: 1400-1440 Time Calculation (min): 40 min Visit#: 4 of 16  Re-eval: 09/20/14 Charges:  Suzi Roots <20cm  Subjective Subjective Assessment Subjective: Pt with original bandages removed and currently with padding over wounds and zippered compression stockings (that do not fit properly.Marland Kitchenand unable to zip all the way)     Wound Therapy Wound / Incision (Open or Dehisced) 08/21/14 Other (Comment) Leg Posterior;Right;Medial dry skin around periwound minimal drainage (Active)  Dressing Type Impregnated gauze (bismuth);kerlix, coban 09/03/2014  2:00 PM  Dressing Changed Changed 09/03/2014  2:00 PM  Dressing Status Clean;Dry;Intact 09/03/2014  2:00 PM  Dressing Change Frequency Every 3 days 08/21/2014  6:21 PM  Site / Wound Assessment Clean;Dry;Bleeding;Black;Granulation tissue;Pale 09/03/2014  2:00 PM  % Wound base Red or Granulating 50% 09/03/2014  2:00 PM  % Wound base Yellow 50% 09/03/2014  2:00 PM  % Wound base Black 0% 09/03/2014  2:00 PM  Peri-wound Assessment Edema;Induration 09/03/2014  2:00 PM  Wound Length (cm) 10.4 cm 08/21/2014  6:21 PM  Wound Width (cm) 4 cm 08/21/2014  6:21 PM  Wound Depth (cm) 0.1 cm 08/21/2014  6:21 PM  Margins Unattached edges (unapproximated) 09/03/2014  2:00 PM  Closure None 09/03/2014  2:00 PM  Drainage Amount Minimal 09/03/2014  2:00 PM  Drainage Description Serosanguineous 09/03/2014  2:00 PM  Treatment Cleansed;Debridement (Selective) 09/03/2014  2:00 PM     Wound / Incision (Open or Dehisced) 08/21/14 Other (Comment) Leg Left;Posterior dry periwound with cracking skin, minimal drainage (Active)  Dressing Type Impregnated gauze (bismuth), kerlix, coban 09/03/2014  2:00 PM  Dressing Changed Changed 09/03/2014  2:00 PM  Dressing Status Clean;Intact;Dry 09/03/2014  2:00 PM  Dressing Change Frequency Every 3 days  08/21/2014  6:21 PM  Site / Wound Assessment Pink;Painful;Yellow 09/03/2014  2:00 PM  % Wound base Red or Granulating 50% 09/03/2014  2:00 PM  % Wound base Yellow 50% 09/03/2014  2:00 PM  % Wound base Black 0% 09/03/2014  2:00 PM  Peri-wound Assessment Induration;Edema 09/03/2014  2:00 PM  Wound Length (cm) 8.5 cm 08/21/2014  6:21 PM  Wound Width (cm) 1.5 cm 08/21/2014  6:21 PM  Wound Depth (cm) 0.2 cm 08/21/2014  6:21 PM  Margins Unattached edges (unapproximated) 09/03/2014  2:00 PM  Closure None 09/03/2014  2:00 PM  Drainage Amount Scant 09/03/2014  2:00 PM  Drainage Description Serosanguineous 09/03/2014  2:00 PM  Treatment Cleansed;Debridement (Selective) 09/03/2014  2:00 PM     Wound / Incision (Open or Dehisced) 08/21/14 Other (Comment) Leg Left;Medial minmal drainage, cracking skin, along medical shin, just inferior medial to other Lt LE wound (Active)  Dressing Type mpregnated gauze (bismuth), kerlix, coban 09/03/2014  2:00 PM  Dressing Changed Changed 09/03/2014  2:00 PM  Dressing Status Clean;Dry;Intact 09/03/2014  2:00 PM  Dressing Change Frequency Every 3 days 08/21/2014  6:21 PM  Site / Wound Assessment Dry;Clean;Bleeding;Black;Granulation tissue;Yellow 08/21/2014  6:21 PM  % Wound base Red or Granulating 50% 09/03/2014  2:00 PM  % Wound base Yellow 50% 09/03/2014  2:00 PM  % Wound base Black 0% 09/03/2014  2:00 PM  Peri-wound Assessment Induration;Edema 09/03/2014  2:00 PM  Wound Length (cm) 5.5 cm 08/21/2014  6:21 PM  Wound Width (cm) 3.7 cm 08/21/2014  6:21 PM  Wound Depth (cm) 0.2 cm 08/21/2014  6:21 PM  Margins Unattached edges (unapproximated) 09/03/2014  2:00  PM  Closure None 09/03/2014  2:00 PM  Drainage Amount Scant 09/03/2014  2:00 PM  Drainage Description Serosanguineous 09/03/2014  2:00 PM  Treatment Cleansed;Debridement (Selective) 09/03/2014  2:00 PM   Selective Debridement Selective Debridement - Location: all three sites Selective Debridement - Tools Used: Forceps Selective  Debridement - Tissue Removed: dry skin, slough   Physical Therapy Assessment and Plan Wound Therapy - Assess/Plan/Recommendations Wound Therapy - Clinical Statement: Wounds overall improving; noted with slight odor today but may have been stagnant drainage as no signs/symptoms of active infection.  Pt encouraged to keep compression intact.  continued dressing with xeroform and use of coban only today (pt does not keep profore sysem intact).  Pt encouraged to get new compression garments as has been some time since receiving last pair order faxed to Dr. Sheldon Silvan for new garments. Wound Plan: continue current wound care including debridement with appropriate dressing changes.  Give signed order for compression garments when received.      Problem List Patient Active Problem List   Diagnosis Date Noted  . 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 Leg wound, left 08/19/2011  . DVT (deep venous thrombosis) 06/15/2011  . H/O PE (pulmonary embolism) 06/15/2011  . 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

## 2014-09-05 ENCOUNTER — Encounter (HOSPITAL_BASED_OUTPATIENT_CLINIC_OR_DEPARTMENT_OTHER): Payer: Medicare Other

## 2014-09-05 ENCOUNTER — Telehealth (HOSPITAL_COMMUNITY): Payer: Self-pay | Admitting: *Deleted

## 2014-09-05 DIAGNOSIS — I871 Compression of vein: Secondary | ICD-10-CM

## 2014-09-05 DIAGNOSIS — I82409 Acute embolism and thrombosis of unspecified deep veins of unspecified lower extremity: Secondary | ICD-10-CM

## 2014-09-05 DIAGNOSIS — D6859 Other primary thrombophilia: Secondary | ICD-10-CM

## 2014-09-05 LAB — PROTIME-INR
INR: 2.51 — AB (ref 0.00–1.49)
Prothrombin Time: 27.1 seconds — ABNORMAL HIGH (ref 11.6–15.2)

## 2014-09-05 NOTE — Telephone Encounter (Signed)
Message copied by Berneta Levins on Wed Sep 05, 2014  1:17 PM ------      Message from: Baird Cancer      Created: Wed Sep 05, 2014  1:00 PM       Perfect! 15 mg alternating 12.5 mg.  INR in 2 weeks ------

## 2014-09-05 NOTE — Telephone Encounter (Signed)
Spoke with patient. Reports he has been taking Coumadin 12.5 mg daily. Instruct to alternating Coumadin 15 mg with 12.5 mg every other day. Patient verbalized understanding.

## 2014-09-05 NOTE — Progress Notes (Signed)
Labs for pt/inr 

## 2014-09-06 ENCOUNTER — Ambulatory Visit (HOSPITAL_COMMUNITY)
Admission: RE | Admit: 2014-09-06 | Discharge: 2014-09-06 | Disposition: A | Discharge status: Home / Self Care | Payer: Self-pay | Source: Ambulatory Visit | Attending: Oncology | Admitting: Oncology

## 2014-09-06 NOTE — Progress Notes (Signed)
Physical Therapy - Wound Therapy  Treatment   Patient Details  Name: Matthew Wise MRN: [id number] Date of Birth: 1961/07/19  Today's Date: 09/06/2014 Time: 6144-3154 Time Calculation (min): 40 min  Visit#: 5 of 16  Re-eval: 09/20/14 Charges:  Deb <20cm  Subjective Subjective Assessment Subjective: Pt with original bandages removed with juxtalites in place.  Pt reports the velcro has worn out on them.     Wound Therapy Wound / Incision (Open or Dehisced) 08/21/14 Other (Comment) Leg Posterior;Right;Medial dry skin around periwound minimal drainage (Active)  Dressing Type Impregnated gauze (bismuth);Compression wrap 09/06/2014  3:00 PM  Dressing Changed Changed 09/06/2014  3:00 PM  Dressing Status Clean;Dry;Intact 09/06/2014  3:00 PM  Dressing Change Frequency Every 3 days 08/21/2014  6:21 PM  Site / Wound Assessment Clean;Dry;Granulation tissue;Pale;Red 09/06/2014  3:00 PM  % Wound base Red or Granulating 65% 09/06/2014  3:00 PM  % Wound base Yellow 35% 09/06/2014  3:00 PM  % Wound base Black 0% 09/06/2014  3:00 PM  Peri-wound Assessment Edema;Induration 09/06/2014  3:00 PM  Wound Length (cm) 10.4 cm 08/21/2014  6:21 PM  Wound Width (cm) 4 cm 08/21/2014  6:21 PM  Wound Depth (cm) 0.1 cm 08/21/2014  6:21 PM  Margins Unattached edges (unapproximated) 09/06/2014  3:00 PM  Closure None 09/06/2014  3:00 PM  Drainage Amount Minimal 09/06/2014  3:00 PM  Drainage Description Serosanguineous 09/06/2014  3:00 PM  Treatment Cleansed;Debridement (Selective) 09/06/2014  3:00 PM     Wound / Incision (Open or Dehisced) 08/21/14 Other (Comment) Leg Left;Posterior dry periwound with cracking skin, minimal drainage (Active)  Dressing Type Compression wrap;Impregnated gauze (bismuth) 09/06/2014  3:00 PM  Dressing Changed Changed 09/06/2014  3:00 PM  Dressing Status Clean;Intact;Dry 09/06/2014  3:00 PM  Dressing Change Frequency Every 3 days 08/21/2014  6:21 PM  Site / Wound Assessment Pink;Painful;Yellow  09/06/2014  3:00 PM  % Wound base Red or Granulating 65% 09/06/2014  3:00 PM  % Wound base Yellow 35% 09/06/2014  3:00 PM  % Wound base Black 0% 09/06/2014  3:00 PM  Peri-wound Assessment Induration;Edema 09/06/2014  3:00 PM  Wound Length (cm) 8.5 cm 08/21/2014  6:21 PM  Wound Width (cm) 1.5 cm 08/21/2014  6:21 PM  Wound Depth (cm) 0.2 cm 08/21/2014  6:21 PM  Margins Unattached edges (unapproximated) 09/06/2014  3:00 PM  Closure None 09/06/2014  3:00 PM  Drainage Amount Scant 09/06/2014  3:00 PM  Drainage Description Serosanguineous 09/06/2014  3:00 PM  Treatment Cleansed;Debridement (Selective) 09/06/2014  3:00 PM     Wound / Incision (Open or Dehisced) 08/21/14 Other (Comment) Leg Left;Medial minmal drainage, cracking skin, along medical shin, just inferior medial to other Lt LE wound (Active)  Dressing Type Compression wrap;Impregnated gauze (bismuth) 09/06/2014  3:00 PM  Dressing Changed Changed 09/03/2014  2:00 PM  Dressing Status Clean;Dry;Intact 09/06/2014  3:00 PM  Dressing Change Frequency Every 3 days 08/21/2014  6:21 PM  Site / Wound Assessment Dry;Clean;Bleeding;Black;Granulation tissue;Yellow 08/21/2014  6:21 PM  % Wound base Red or Granulating 50% 09/06/2014  3:00 PM  % Wound base Yellow 50% 09/06/2014  3:00 PM  % Wound base Black 0% 09/06/2014  3:00 PM  Peri-wound Assessment Induration;Edema 09/06/2014  3:00 PM  Wound Length (cm) 5.5 cm 08/21/2014  6:21 PM  Wound Width (cm) 3.7 cm 08/21/2014  6:21 PM  Wound Depth (cm) 0.2 cm 08/21/2014  6:21 PM  Margins Unattached edges (unapproximated) 09/06/2014  3:00 PM  Closure None 09/06/2014  3:00 PM  Drainage Amount Scant 09/06/2014  3:00 PM  Drainage Description Serosanguineous 09/06/2014  3:00 PM  Treatment Cleansed;Debridement (Selective) 09/03/2014  2:00 PM   Selective Debridement Selective Debridement - Location: all three sites Selective Debridement - Tools Used: Forceps Selective Debridement - Tissue Removed: dry skin, slough   Physical Therapy  Assessment and Plan Wound Therapy - Assess/Plan/Recommendations Wound Therapy - Clinical Statement: No odor today.  Wounds with overall improvment and increasing granulation.  Debrided large amount of dry skin, hyperkeratosis removed from peimeter.  Moisturized perimeter well with lotion and continued xeroform over wounds.  Bilateral LE's wrapped with kerlix and patient used juxtalites over dressings.  Pt given returned signed order for compression garments and given instructions to call [facility] and get new juxtaFITS vs knee high compression garments.  Pt verbalized understanding.  Wound Plan: continue current wound care including debridement with appropriate dressing changes.       Problem List Patient Active Problem List   Diagnosis Date Noted  . 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 Leg wound, left 08/19/2011  . DVT (deep venous thrombosis) 06/15/2011  . H/O PE (pulmonary embolism) 06/15/2011  . 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

## 2014-09-10 ENCOUNTER — Ambulatory Visit (HOSPITAL_COMMUNITY): Payer: Self-pay | Admitting: Physical Therapy

## 2014-09-13 ENCOUNTER — Ambulatory Visit (HOSPITAL_COMMUNITY)
Admission: RE | Admit: 2014-09-13 | Discharge: 2014-09-13 | Disposition: A | Discharge status: Home / Self Care | Payer: Self-pay | Source: Ambulatory Visit | Attending: Oncology | Admitting: Oncology

## 2014-09-13 NOTE — Progress Notes (Signed)
Physical Therapy - Wound Therapy  Treatment   Patient Details  Name: Matthew Wise MRN: [id number] Date of Birth: 03/11/1961  Today's Date: 09/13/2014 Time: 4196-2229 Time Calculation (min): 40 min Visit#: 6 of 16  Re-eval: 09/20/14 Charges:  Deb<20cm  Subjective Subjective Assessment Subjective: Pt with original bandages removed with juxtalites in place.  Pt reports the velcro has worn out on them.     Wound Therapy Wound / Incision (Open or Dehisced) 08/21/14 Other (Comment) Leg Posterior;Right;Medial dry skin around periwound minimal drainage (Active)  Dressing Type Impregnated gauze (bismuth);Coban 09/13/2014  2:00 PM  Dressing Changed Changed 09/13/2014  2:00 PM  Dressing Status Clean;Dry;Intact 09/13/2014  2:00 PM  Dressing Change Frequency Every 3 days 08/21/2014  6:21 PM  Site / Wound Assessment Clean;Dry;Granulation tissue;Pale;Red 09/13/2014  2:00 PM  % Wound base Red or Granulating 65% 09/13/2014  2:00 PM  % Wound base Yellow 10% 09/13/2014  2:00 PM  % Wound base Black 25% 09/13/2014  2:00 PM  Peri-wound Assessment Edema;Induration 09/13/2014  2:00 PM  Wound Length (cm) 10.4 cm 08/21/2014  6:21 PM  Wound Width (cm) 4 cm 08/21/2014  6:21 PM  Wound Depth (cm) 0.1 cm 08/21/2014  6:21 PM  Margins Unattached edges (unapproximated) 09/13/2014  2:00 PM  Closure None 09/13/2014  2:00 PM  Drainage Amount Minimal 09/13/2014  2:00 PM  Drainage Description Serosanguineous 09/13/2014  2:00 PM  Treatment Cleansed;Debridement (Selective) 09/13/2014  2:00 PM     Wound / Incision (Open or Dehisced) 08/21/14 Other (Comment) Leg Left;Posterior dry periwound with cracking skin, minimal drainage (Active)  Dressing Type Coban;Impregnated gauze (bismuth) 09/13/2014  2:00 PM  Dressing Changed Changed 09/13/2014  2:00 PM  Dressing Status Clean;Intact;Dry 09/13/2014  2:00 PM  Dressing Change Frequency Every 3 days 08/21/2014  6:21 PM  Site / Wound Assessment Pink;Painful;Yellow 09/13/2014   2:00 PM  % Wound base Red or Granulating 65% 09/13/2014  2:00 PM  % Wound base Yellow 10% 09/13/2014  2:00 PM  % Wound base Black 25% 09/13/2014  2:00 PM  Peri-wound Assessment Induration;Edema 09/13/2014  2:00 PM  Wound Length (cm) 8.5 cm 08/21/2014  6:21 PM  Wound Width (cm) 1.5 cm 08/21/2014  6:21 PM  Wound Depth (cm) 0.2 cm 08/21/2014  6:21 PM  Margins Unattached edges (unapproximated) 09/13/2014  2:00 PM  Closure None 09/13/2014  2:00 PM  Drainage Amount Scant 09/13/2014  2:00 PM  Drainage Description Serosanguineous 09/13/2014  2:00 PM  Treatment Cleansed;Debridement (Selective) 09/13/2014  2:00 PM     Wound / Incision (Open or Dehisced) 08/21/14 Other (Comment) Leg Left;Medial minmal drainage, cracking skin, along medical shin, just inferior medial to other Lt LE wound (Active)  Dressing Type Coban;Impregnated gauze (bismuth) 09/13/2014  2:00 PM  Dressing Changed Changed 09/13/2014  2:00 PM  Dressing Status Clean;Dry;Intact 09/13/2014  2:00 PM  Dressing Change Frequency Every 3 days 08/21/2014  6:21 PM  Site / Wound Assessment Dry;Clean;Bleeding;Black;Granulation tissue;Yellow 08/21/2014  6:21 PM  % Wound base Red or Granulating 50% 09/13/2014  2:00 PM  % Wound base Yellow 10% 09/13/2014  2:00 PM  % Wound base Black 40% 09/13/2014  2:00 PM  Peri-wound Assessment Induration;Edema 09/13/2014  2:00 PM  Wound Length (cm) 5.5 cm 08/21/2014  6:21 PM  Wound Width (cm) 3.7 cm 08/21/2014  6:21 PM  Wound Depth (cm) 0.2 cm 08/21/2014  6:21 PM  Margins Unattached edges (unapproximated) 09/13/2014  2:00 PM  Closure None 09/13/2014  2:00 PM  Drainage Amount Scant 09/13/2014  2:00 PM  Drainage Description Serosanguineous 09/13/2014  2:00 PM  Treatment Cleansed;Debridement (Selective) 09/13/2014  2:00 PM   Selective Debridement Selective Debridement - Location: all three sites Selective Debridement - Tools Used: Forceps Selective Debridement - Tissue Removed: dry skin, slough   Physical Therapy  Assessment and Plan Wound Therapy - Assess/Plan/Recommendations Wound Therapy - Clinical Statement: Wounds continue to heal despite lack of dressing.  Slough is decreasing with cointinued Debridment of dry skin/ hyperkeratosis removed from peimeter.  Moisturized perimeter well with lotion and continued xeroform over wounds.  Bilateral LE's wrapped with kerlix and coban.  Pt states he is going to [facility] tomorrow to get new juxtafits for bilateral LE's. Wound Plan: continue current wound care including debridement with appropriate dressing changes.       Problem List Patient Active Problem List   Diagnosis Date Noted  . 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 Leg wound, left 08/19/2011  . DVT (deep venous thrombosis) 06/15/2011  . H/O PE (pulmonary embolism) 06/15/2011  . 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 IVC obstruction 06/12/2011     Teena Irani, PTA/CLT 09/13/2014, 2:39 PM

## 2014-09-17 ENCOUNTER — Other Ambulatory Visit (HOSPITAL_COMMUNITY): Payer: Self-pay | Admitting: Oncology

## 2014-09-19 ENCOUNTER — Other Ambulatory Visit (HOSPITAL_COMMUNITY): Payer: Medicare Other

## 2014-09-21 ENCOUNTER — Telehealth (HOSPITAL_COMMUNITY): Payer: Self-pay

## 2014-09-21 ENCOUNTER — Encounter (HOSPITAL_BASED_OUTPATIENT_CLINIC_OR_DEPARTMENT_OTHER): Payer: Medicare Other

## 2014-09-21 DIAGNOSIS — I82409 Acute embolism and thrombosis of unspecified deep veins of unspecified lower extremity: Secondary | ICD-10-CM

## 2014-09-21 DIAGNOSIS — I871 Compression of vein: Secondary | ICD-10-CM

## 2014-09-21 LAB — PROTIME-INR
INR: 4.63 — AB (ref 0.00–1.49)
Prothrombin Time: 44 seconds — ABNORMAL HIGH (ref 11.6–15.2)

## 2014-09-21 NOTE — Telephone Encounter (Signed)
Taking coumadin 12.5mg  alternating with 15 mg.

## 2014-09-21 NOTE — Telephone Encounter (Signed)
Patient instructed to change coumadin to 12.5 mg daily and to come for office visit and PT/INR on 09/28/14.  Labs at 9:20am and office visit @ 10am.  Verbalized understanding of instructions.

## 2014-09-21 NOTE — Telephone Encounter (Signed)
Message copied by Mellissa Kohut on Fri Sep 21, 2014 10:52 AM ------      Message from: Farrel Gobble A      Created: Fri Sep 21, 2014 10:47 AM       How much Warfarin is this patient taking at this time? ------

## 2014-09-21 NOTE — Progress Notes (Signed)
Labs for pt/inr 

## 2014-09-21 NOTE — Telephone Encounter (Signed)
Message copied by Mellissa Kohut on Fri Sep 21, 2014 11:12 AM ------      Message from: Farrel Gobble A      Created: Fri Sep 21, 2014 11:04 AM       Please tell him to decrease dose to 12.5 mg daily and repeat INR in 1 week.      ----- Message -----         From: Mellissa Kohut, RN         Sent: 09/21/2014  10:53 AM           To: Farrel Gobble, MD            Suppose to be taking 12.5 mg alternating with 15 mg.      ----- Message -----         From: Farrel Gobble, MD         Sent: 09/21/2014  10:47 AM           To: Mellissa Kohut, RN            How much Warfarin is this patient taking at this time?             ------

## 2014-09-25 ENCOUNTER — Ambulatory Visit (HOSPITAL_COMMUNITY): Payer: Self-pay

## 2014-09-25 ENCOUNTER — Encounter (HOSPITAL_COMMUNITY): Payer: Self-pay

## 2014-09-25 ENCOUNTER — Telehealth (HOSPITAL_COMMUNITY): Payer: Self-pay

## 2014-09-25 ENCOUNTER — Ambulatory Visit (HOSPITAL_COMMUNITY)
Admission: RE | Admit: 2014-09-25 | Payer: Self-pay | Source: Ambulatory Visit | Attending: Oncology | Admitting: Oncology

## 2014-09-25 NOTE — Telephone Encounter (Signed)
cx - no reason given

## 2014-09-25 NOTE — Progress Notes (Signed)
[name], MD [place] [address]  H/O PE (pulmonary embolism)  DVT (deep venous thrombosis), unspecified laterality - Plan: oxycodone (OXY-IR) 5 MG capsule  IVC (inferior vena cava obstruction)  Supratherapeutic INR  CURRENT THERAPY: Anticoagulation with Warfarin with history of noncompliance.  INTERVAL HISTORY: Matthew Wise 53 y.o. 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  I personally reviewed and went over laboratory results with the patient.  The results are noted within this dictation.  I reviewed INR which is nearly 4.  I have asked him to hold Coumadin x 1 day with repeat INR in 7-10 days.  Discussion regarding alternative anticoagulant treatments including Xarelto and Eliquis. He is educated about the recently approved reversal medication for Xa inhibitors.  I discussed safety behavior for all anticoagulation including wearing a seatbelt, following gun safety rules, following power tool safety procedures, etc.    He denies any hematologic complaints including bleeding and ROS questioning is negative.  Past Medical History  Diagnosis Date  . DVT of leg (deep venous thrombosis)     rt. leg  . Inferior vena caval thrombosis     Chronic  . DVT (deep venous thrombosis) 06/15/2011  . H/O PE (pulmonary embolism) 06/15/2011  . 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     has No Known Allergies.  Matthew Wise had no medications administered during this  visit.  History reviewed. No pertinent past surgical history.  Denies any headaches, dizziness, double vision, fevers, chills, night sweats, nausea, vomiting, diarrhea, constipation, chest pain, heart palpitations, shortness of breath, blood in stool, black tarry stool, urinary pain, urinary burning, urinary frequency, hematuria.   PHYSICAL EXAMINATION  ECOG PERFORMANCE STATUS: 0 - Asymptomatic  Filed Vitals:   09/28/14 0931  BP: 140/85  Pulse: 59  Temp: 98.7 F (37.1 C)  Resp: 16    GENERAL:alert, no distress, well nourished, well developed, comfortable, cooperative, obese and smiling SKIN: skin color, texture, turgor are normal, no rashes or significant lesions HEAD: Normocephalic, No masses, lesions, tenderness or abnormalities EYES: normal, PERRLA, EOMI, Conjunctiva are pink and non-injected EARS: External ears normal OROPHARYNX:mucous membranes are moist  NECK: supple, no stridor, non-tender, trachea midline LYMPH:  no palpable lymphadenopathy BREAST:not examined LUNGS: clear to auscultation  HEART: regular rate & rhythm, no murmurs and no gallops ABDOMEN:abdomen soft and obese BACK: Back symmetric, no curvature. EXTREMITIES:less then 2 second capillary refill, no joint deformities, effusion, or inflammation, no skin discoloration, no clubbing, no cyanosis  NEURO: alert & oriented x 3 with fluent speech, no focal motor/sensory deficits, gait normal   LABORATORY DATA: CBC    Component Value Date/Time   WBC 4.9 09/28/2014 0930   RBC 5.52 09/28/2014 0930   RBC 5.46 12/10/2008 0600   HGB 17.0 09/28/2014 0930   HCT 47.9 09/28/2014 0930   PLT 203 09/28/2014 0930   MCV 86.8 09/28/2014 0930   MCH 30.8 09/28/2014 0930   MCHC 35.5 09/28/2014 0930   RDW 13.6 09/28/2014 0930   LYMPHSABS 2.3 09/28/2014 0930   MONOABS 0.4 09/28/2014 0930  EOSABS 0.1 09/28/2014 0930   BASOSABS 0.0 09/28/2014 0930       Chemistry      Component Value Date/Time   NA 138 10/02/2013 1012    K 3.6 10/02/2013 1012   CL 99 10/02/2013 1012   CO2 29 10/02/2013 1012   BUN 11 10/02/2013 1012   CREATININE 1.11 10/02/2013 1012      Component Value Date/Time   CALCIUM 9.7 10/02/2013 1012   ALKPHOS 84 03/16/2012 1254   AST 32 03/16/2012 1254   ALT 29 03/16/2012 1254   BILITOT 0.8 03/16/2012 1254     Lab Results  Component Value Date   INR 3.93* 09/28/2014   INR 4.63* 09/21/2014   INR 2.51* 09/05/2014       ASSESSMENT:  1. 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 Patient seen by Dr. Christoper Allegra at [facility] who found that he also has sickle cell trait which increases the patient's risk of DVTs. He did not have cardiolipin antibodies, nor beta-2 glycoprotein antibodies. It is believed by both, Dr. Mare Ferrari and [place], that he is not a good candidate for ongoing work and is a candidate for permanent disability  2. Continued tobacco use 3. Supra-therapeutic INR  Patient Active Problem List   Diagnosis Date Noted  . 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 Leg wound, left 08/19/2011  . DVT (deep venous thrombosis) 06/15/2011  . H/O PE (pulmonary embolism) 06/15/2011  . 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 I personally reviewed and went over laboratory results with the patient.  The results are noted within this dictation. 2. Warfarin compliance encouraged 3. Continue Coumadin dose as prescribed 4. Hold Coumadin x 1 day, then restart Saturday evening. 5. INR in 7-10 days 6. Patient education regarding Vitamin K antagonist and Direct Xa inhibitors 7. Return in 3 months for follow-up   THERAPY PLAN:  We will continue to anticoagulate with Warfarin and manage INR results accordingly.  He will consider changing therapy to Xa  inhibitor.  All questions were answered. The patient knows to call the clinic with any problems, questions or concerns. We can certainly see the patient much sooner if necessary.  Patient and plan discussed with Dr. Farrel Gobble and he is in agreement with the aforementioned.   Matthew Wise,Matthew Wise 09/28/2014

## 2014-09-26 NOTE — Progress Notes (Signed)
This encounter was created in error - please disregard.

## 2014-09-27 ENCOUNTER — Ambulatory Visit (HOSPITAL_COMMUNITY)
Admission: RE | Admit: 2014-09-27 | Discharge: 2014-09-27 | Disposition: A | Discharge status: Home / Self Care | Payer: Self-pay | Source: Ambulatory Visit | Attending: Internal Medicine | Admitting: Internal Medicine

## 2014-09-27 NOTE — Progress Notes (Signed)
Physical Therapy - Wound Therapy  Treatment   Patient Details  Name: Matthew Wise MRN: [id number] Date of Birth: May 15, 1961  Today's Date: 09/27/2014 Time: 1310-1340 Time Calculation (min): 30 min Charge: selective debridement <20 cm  Visit#: 7 of 16  Re-eval: 10/25/14  Subjective Subjective Assessment Subjective: Pt come into dept with dressings removed and jjuxtalites on.  Stated he is going to get new pairs today following therapy.  Pain Assessment Pain Assessment Pain Assessment: No/denies pain  Wound Therapy  09/27/14 1500  Selective Debridement  Selective Debridement - Location 2 site on Rt LE  Selective Debridement - Tools Used Forceps  Selective Debridement - Tissue Removed dry skin, slough  Wound Therapy - Assess/Plan/Recommendations  Wound Therapy - Clinical Statement Reassessment complete with the following findings:  Lt LE completely healed with noted hyperkeratosis.  Measurements complete for Rt LE (see above).  Selective debridment complete for removal of slough and dry skin to promote healing.  Compression hose complete to Rt LE and pt placed Juxtalite on Lt LE.  Pt educated on importance of keeping wounds covered.  Pt reported plans to get new dressings from pharmarcy later today.    Wound Plan Continue with current PT POC.  Continue wound care to Rt LE, check on Lt LE.  F/U on new Juxtalites  Wound Therapy Goals - Improve the function of patient's integumentary system by progressing the wound(s) through the phases of wound healing by:  Decrease Necrotic Tissue to 0  Decrease Necrotic Tissue - Progress Progressing toward goal  Increase Granulation Tissue to 100  Increase Granulation Tissue - Progress Progressing toward goal  Decrease Length/Width/Depth by (cm) STG:  wounds decreased by 50%;   LTG wounds to be healed as the pt has had chronic hx of wounds for over a year now.  Decrease Length/Width/Depth - Progress Progressing toward goal  Improve Drainage  Characteristics Mod  Improve Drainage Characteristics - Progress Met  Patient/Family will be able to  recall previosu education and state following home care guidelines.  Patient/Family Instruction Goal - Progress Progressing toward goal    Selective Debridement Selective Debridement - Location: 2 site on Rt LE Selective Debridement - Tools Used: Forceps Selective Debridement - Tissue Removed: dry skin, slough   Physical Therapy Assessment and Plan Wound Therapy - Assess/Plan/Recommendations Wound Therapy - Clinical Statement: Reassessment complete with the following findings:  Lt LE completely healed with noted hyperkeratosis.  Measurements complete for Rt LE (see above).  Selective debridment complete for removal of slough and dry skin to promote healing.  Compression hose complete to Rt LE and pt placed Juxtalite on Lt LE.  Pt educated on importance of keeping wounds covered.  Pt reported plans to get new dressings from pharmarcy later today.   Wound Plan: Continue with current PT POC.  Continue wound care to Rt LE, check on Lt LE.  F/U on new Juxtalites      Goals Wound Therapy Goals - Improve the function of patient's integumentary system by progressing the wound(s) through the phases of wound healing by: Decrease Necrotic Tissue to: 0 Decrease Necrotic Tissue - Progress: Progressing toward goal Increase Granulation Tissue to: 100 Increase Granulation Tissue - Progress: Progressing toward goal Decrease Length/Width/Depth by (cm): STG:  wounds decreased by 50%;   LTG wounds to be healed as the pt has had chronic hx of wounds for over a year now. Decrease Length/Width/Depth - Progress: Progressing toward goal Improve Drainage Characteristics: Mod Improve Drainage Characteristics - Progress: Met Patient/Family will  be able to : recall previosu education and state following home care guidelines. Patient/Family Instruction Goal - Progress: Progressing toward goal  Problem List Patient  Active Problem List   Diagnosis Date Noted  . 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 Leg wound, left 08/19/2011  . DVT (deep venous thrombosis) 06/15/2011  . H/O PE (pulmonary embolism) 06/15/2011  . 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

## 2014-09-28 ENCOUNTER — Encounter (HOSPITAL_COMMUNITY): Payer: Self-pay | Admitting: Oncology

## 2014-09-28 ENCOUNTER — Encounter (HOSPITAL_BASED_OUTPATIENT_CLINIC_OR_DEPARTMENT_OTHER): Payer: Medicare Other | Admitting: Oncology

## 2014-09-28 ENCOUNTER — Encounter (HOSPITAL_COMMUNITY): Payer: Self-pay

## 2014-09-28 VITALS — BP 140/85 | HR 59 | Temp 98.7°F | Resp 16 | Wt 289.1 lb

## 2014-09-28 DIAGNOSIS — I82409 Acute embolism and thrombosis of unspecified deep veins of unspecified lower extremity: Secondary | ICD-10-CM

## 2014-09-28 DIAGNOSIS — D573 Sickle-cell trait: Secondary | ICD-10-CM

## 2014-09-28 DIAGNOSIS — Z7901 Long term (current) use of anticoagulants: Secondary | ICD-10-CM

## 2014-09-28 DIAGNOSIS — I871 Compression of vein: Secondary | ICD-10-CM

## 2014-09-28 DIAGNOSIS — Z720 Tobacco use: Secondary | ICD-10-CM

## 2014-09-28 DIAGNOSIS — I2699 Other pulmonary embolism without acute cor pulmonale: Secondary | ICD-10-CM

## 2014-09-28 DIAGNOSIS — R791 Abnormal coagulation profile: Secondary | ICD-10-CM

## 2014-09-28 LAB — CBC WITH DIFFERENTIAL/PLATELET
BASOS PCT: 0 % (ref 0–1)
Basophils Absolute: 0 10*3/uL (ref 0.0–0.1)
EOS ABS: 0.1 10*3/uL (ref 0.0–0.7)
EOS PCT: 1 % (ref 0–5)
HCT: 47.9 % (ref 39.0–52.0)
Hemoglobin: 17 g/dL (ref 13.0–17.0)
LYMPHS ABS: 2.3 10*3/uL (ref 0.7–4.0)
Lymphocytes Relative: 46 % (ref 12–46)
MCH: 30.8 pg (ref 26.0–34.0)
MCHC: 35.5 g/dL (ref 30.0–36.0)
MCV: 86.8 fL (ref 78.0–100.0)
MONOS PCT: 9 % (ref 3–12)
Monocytes Absolute: 0.4 10*3/uL (ref 0.1–1.0)
Neutro Abs: 2.1 10*3/uL (ref 1.7–7.7)
Neutrophils Relative %: 44 % (ref 43–77)
Platelets: 203 10*3/uL (ref 150–400)
RBC: 5.52 MIL/uL (ref 4.22–5.81)
RDW: 13.6 % (ref 11.5–15.5)
WBC: 4.9 10*3/uL (ref 4.0–10.5)

## 2014-09-28 LAB — PROTIME-INR
INR: 3.93 — ABNORMAL HIGH (ref 0.00–1.49)
Prothrombin Time: 38.7 seconds — ABNORMAL HIGH (ref 11.6–15.2)

## 2014-09-28 MED ORDER — OXYCODONE HCL 5 MG PO CAPS: 10.0000 mg | ORAL_CAPSULE | ORAL | Status: DC | PRN

## 2014-09-28 NOTE — Progress Notes (Signed)
Matthew Wise presented for labwork. Labs per MD order drawn via Peripheral Line 25 gauge needle inserted in rt ac.  Good blood return present. Procedure without incident.  Needle removed intact. Patient tolerated procedure well.

## 2014-09-28 NOTE — Patient Instructions (Signed)
[facility] Discharge Instructions  RECOMMENDATIONS MADE BY THE CONSULTANT AND ANY TEST RESULTS WILL BE SENT TO YOUR REFERRING PHYSICIAN.  MEDICATIONS PRESCRIBED:  None  INSTRUCTIONS GIVEN AND DISCUSSED: Hold Coumadin x  1 day.  Then restart on Saturday 12.5 mg daily.  INR in 7-10 days  SPECIAL INSTRUCTIONS/FOLLOW-UP: Return as scheduled for Coumadin checks. Return in 3 months for follow-up  Thank you for choosing [facility] to provide your oncology and hematology care.  To afford each patient quality time with our providers, please arrive at least 15 minutes before your scheduled appointment time.  With your help, our goal is to use those 15 minutes to complete the necessary work-up to ensure our physicians have the information they need to help with your evaluation and healthcare recommendations.    Effective January 1st, 2014, we ask that you re-schedule your appointment with our physicians should you arrive 10 or more minutes late for your appointment.  We strive to give you quality time with our providers, and arriving late affects you and other patients whose appointments are after yours.    Again, thank you for choosing [facility].  Our hope is that these requests will decrease the amount of time that you wait before being seen by our physicians.       _____________________________________________________________  Should you have questions after your visit to [facility], please contact our office at [phone] between the hours of 8:30 a.m. and 5:00 p.m.  Voicemails left after 4:30 p.m. will not be returned until the following business day.  For prescription refill requests, have your pharmacy contact our office with your prescription refill request.

## 2014-10-02 ENCOUNTER — Ambulatory Visit (HOSPITAL_COMMUNITY)
Admission: RE | Admit: 2014-10-02 | Discharge: 2014-10-02 | Disposition: A | Discharge status: Home / Self Care | Payer: Medicare Other | Source: Ambulatory Visit | Attending: Hematology and Oncology | Admitting: Hematology and Oncology

## 2014-10-02 ENCOUNTER — Encounter (HOSPITAL_COMMUNITY): Payer: Self-pay | Admitting: Physical Therapy

## 2014-10-02 DIAGNOSIS — Z86711 Personal history of pulmonary embolism: Secondary | ICD-10-CM | POA: Insufficient documentation

## 2014-10-02 DIAGNOSIS — I82409 Acute embolism and thrombosis of unspecified deep veins of unspecified lower extremity: Secondary | ICD-10-CM | POA: Diagnosis not present

## 2014-10-02 DIAGNOSIS — S91002D Unspecified open wound, left ankle, subsequent encounter: Secondary | ICD-10-CM

## 2014-10-02 DIAGNOSIS — D573 Sickle-cell trait: Secondary | ICD-10-CM | POA: Diagnosis not present

## 2014-10-02 DIAGNOSIS — S81001D Unspecified open wound, right knee, subsequent encounter: Secondary | ICD-10-CM | POA: Insufficient documentation

## 2014-10-02 DIAGNOSIS — I871 Compression of vein: Secondary | ICD-10-CM | POA: Diagnosis not present

## 2014-10-02 DIAGNOSIS — S81801D Unspecified open wound, right lower leg, subsequent encounter: Secondary | ICD-10-CM

## 2014-10-02 DIAGNOSIS — S81802D Unspecified open wound, left lower leg, subsequent encounter: Secondary | ICD-10-CM

## 2014-10-02 DIAGNOSIS — S91001D Unspecified open wound, right ankle, subsequent encounter: Secondary | ICD-10-CM

## 2014-10-02 DIAGNOSIS — Z86718 Personal history of other venous thrombosis and embolism: Secondary | ICD-10-CM | POA: Insufficient documentation

## 2014-10-02 DIAGNOSIS — S81002D Unspecified open wound, left knee, subsequent encounter: Secondary | ICD-10-CM

## 2014-10-02 NOTE — Therapy (Signed)
Physical Therapy Treatment  Patient Details  Name: Matthew Wise MRN: [id number] Date of Birth: [birth year]  Encounter Date: 10/02/2014      PT End of Session - 10/02/14 1746    Visit Number 8   Number of Visits 16   Date for PT Re-Evaluation 10/25/14   Authorization Type medicare   Authorization - Visit Number 8   Authorization - Number of Visits 10   PT Start Time 1505   PT Stop Time 1730   PT Time Calculation (min) 25 min   Activity Tolerance Patient tolerated treatment well      Past Medical History  Diagnosis Date  . DVT of leg (deep venous thrombosis)     rt. leg  . Inferior vena caval thrombosis     Chronic  . DVT (deep venous thrombosis) 06/15/2011  . H/O PE (pulmonary embolism) 06/15/2011  . 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 No pertinent past surgical history.  There were no vitals taken for this visit.  Visit Diagnosis:  Open wound of knee, leg (except thigh), and ankle, complicated, right, subsequent encounter  Open wound of knee, leg (except thigh), and ankle, complicated, left, subsequent encounter     10/02/14 1700  Subjective Assessment  Subjective Pt with juxtalites in place.  States he also bought a pair of knee highs to alternate.  Pt reports Lt LE is fully healed now.  Wound / Incision (Open or Dehisced)  Date First Assessed/Time First Assessed: 08/21/14 1300   Wound Type: (c) Other (Comment)  Location: Leg  Location Orientation: Posterior;Right;Medial  Wound Description (Comments): dry skin around periwound minimal drainage  Present on Admission: Yes  Dressing Type Impregnated gauze (bismuth);Compression wrap (juxtalites)  Dressing Status Clean;Dry;Intact  Site / Wound Assessment Clean;Dry;Granulation tissue;Pale;Red  % Wound base Red or Granulating 75%  % Wound base Yellow 5%  % Wound base Black 20% (hyperkeratosis)  Peri-wound Assessment Edema;Induration  Margins Attached edges (approximated)   Closure None  Drainage Amount Minimal  Drainage Description Serosanguineous  Treatment Cleansed;Debridement (Selective)  Selective Debridement  Selective Debridement - Location 2 site on Rt LE  Selective Debridement - Tools Used Forceps  Selective Debridement - Tissue Removed dry skin, slough  Wound Therapy - Assess/Plan/Recommendations  Wound Therapy - Clinical Statement Wounds on Lt LE now healed; Rt LE almost healed.  Removed alot of hyperkeratosis from surrounding wounds and slough.  Continued with xeroform dressings and used gauze, medipore tape to secure.  Pt donned juxtafit back over dressing independently.    Wound Plan Continue with current PT POC.  Continue wound care to Rt LE.  Wound Therapy Goals - Improve the function of patient's integumentary system by progressing the wound(s) through the phases of wound healing by:  Decrease Necrotic Tissue to 0  Decrease Necrotic Tissue - Progress Progressing toward goal  Increase Granulation Tissue to 100  Increase Granulation Tissue - Progress Progressing toward goal  Decrease Length/Width/Depth by (cm) STG:  wounds decreased by 50%;   LTG wounds to be healed as the pt has had chronic hx of wounds for over a year now.  Decrease Length/Width/Depth - Progress Progressing toward goal  Improve Drainage Characteristics Mod  Improve Drainage Characteristics - Progress Met  Patient/Family will be able to  recall previosu education and state following home care guidelines.  Patient/Family Instruction Goal - Progress Progressing toward goal         Problem List Patient Active Problem List   Diagnosis Date Noted  .  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 Leg wound, left 08/19/2011  . DVT (deep venous thrombosis) 06/15/2011  . H/O PE (pulmonary embolism) 06/15/2011  . 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

## 2014-10-04 ENCOUNTER — Ambulatory Visit (HOSPITAL_COMMUNITY)
Admission: RE | Admit: 2014-10-04 | Discharge: 2014-10-04 | Disposition: A | Discharge status: Home / Self Care | Payer: Medicare Other | Source: Ambulatory Visit | Attending: Internal Medicine | Admitting: Internal Medicine

## 2014-10-04 DIAGNOSIS — S81801D Unspecified open wound, right lower leg, subsequent encounter: Principal | ICD-10-CM

## 2014-10-04 DIAGNOSIS — S81002D Unspecified open wound, left knee, subsequent encounter: Secondary | ICD-10-CM

## 2014-10-04 DIAGNOSIS — S81001D Unspecified open wound, right knee, subsequent encounter: Secondary | ICD-10-CM

## 2014-10-04 DIAGNOSIS — I82409 Acute embolism and thrombosis of unspecified deep veins of unspecified lower extremity: Secondary | ICD-10-CM | POA: Diagnosis not present

## 2014-10-04 DIAGNOSIS — S81802D Unspecified open wound, left lower leg, subsequent encounter: Secondary | ICD-10-CM

## 2014-10-04 DIAGNOSIS — S91001D Unspecified open wound, right ankle, subsequent encounter: Principal | ICD-10-CM

## 2014-10-04 DIAGNOSIS — S91002D Unspecified open wound, left ankle, subsequent encounter: Secondary | ICD-10-CM

## 2014-10-04 NOTE — Therapy (Signed)
Physical Therapy Wound Treatment  Patient Details  Name: Matthew Wise MRN: [id number] Date of Birth: 05/04/1961  Encounter Date: 10/04/2014      PT End of Session - 10/04/14 1636    Visit Number 9   Number of Visits 16   Date for PT Re-Evaluation 10/25/14   Authorization Type medicare   Authorization - Visit Number 9   Authorization - Number of Visits 10   PT Start Time 1525   PT Stop Time 1550   PT Time Calculation (min) 25 min   Activity Tolerance Patient tolerated treatment well      Past Medical History  Diagnosis Date  . DVT of leg (deep venous thrombosis)     rt. leg  . Inferior vena caval thrombosis     Chronic  . DVT (deep venous thrombosis) 06/15/2011  . H/O PE (pulmonary embolism) 06/15/2011  . 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  There were no vitals taken for this visit.  Visit Diagnosis:  Open wound of knee, leg (except thigh), and ankle, complicated, right, subsequent encounter  Open wound of knee, leg (except thigh), and ankle, complicated, left, subsequent encounter      Problem List Patient Active Problem List   Diagnosis Date Noted  . 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 Leg wound, left 08/19/2011  . DVT (deep venous thrombosis) 06/15/2011  . H/O PE (pulmonary embolism) 06/15/2011  . 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 States his leg is tender tot the touch around wound.  No heat or signs of infection noted.  Wound continues to heal nicely.   Wound Properties Date First Assessed: 08/21/14 Time First Assessed: 1300 Wound Type: Other (Comment) , CVI  Location: Leg Location Orientation: Posterior;Right;Medial Wound Description (Comments): dry skin around periwound minimal drainage Present on Admission: Yes   Dressing  Type Impregnated gauze (bismuth);Compression wrap  juxtalites   Dressing Changed Changed   Dressing Status Clean;Dry;Intact   Site / Wound Assessment Clean;Dry;Granulation tissue;Pale;Red   % Wound base Red or Granulating 75%   % Wound base Yellow 5%   % Wound base Black 20%  hyperkeratosis   Peri-wound Assessment Edema;Induration  hyperkeratosis   Margins Attached edges (approximated)   Closure None   Drainage Amount Minimal   Drainage Description Serosanguineous   Non-staged Wound Description Not applicable   Treatment Cleansed;Debridement (Selective)   Selective Debridement - Location 2 site on Rt LE   Selective Debridement - Tools Used Forceps   Selective Debridement - Tissue Removed dry skin, slough   Wound Therapy - Clinical Statement Wounds on posterior Rt LE continue to heal with increasing granulation and decreasing induration and hyperkeratosis.      Wound Plan Continue to progress towards goals.   Decrease Necrotic Tissue to 0   Decrease Necrotic Tissue - Progress Progressing toward goal   Increase Granulation Tissue to 100   Increase Granulation Tissue - Progress Progressing toward goal   Improve Drainage Characteristics Min   Improve Drainage Characteristics - Progress Met         B , PTA/CLT 10/04/2014, 4:39 PM      

## 2014-10-05 ENCOUNTER — Other Ambulatory Visit (HOSPITAL_COMMUNITY): Payer: Medicare Other

## 2014-10-05 ENCOUNTER — Other Ambulatory Visit (HOSPITAL_COMMUNITY): Payer: Self-pay | Admitting: Oncology

## 2014-10-05 ENCOUNTER — Ambulatory Visit (HOSPITAL_COMMUNITY)
Admission: RE | Admit: 2014-10-05 | Discharge: 2014-10-05 | Disposition: A | Discharge status: Home / Self Care | Payer: Medicare Other | Source: Ambulatory Visit | Attending: Oncology | Admitting: Oncology

## 2014-10-05 DIAGNOSIS — I82431 Acute embolism and thrombosis of right popliteal vein: Secondary | ICD-10-CM | POA: Diagnosis not present

## 2014-10-05 DIAGNOSIS — M79604 Pain in right leg: Secondary | ICD-10-CM

## 2014-10-05 DIAGNOSIS — Z86718 Personal history of other venous thrombosis and embolism: Secondary | ICD-10-CM | POA: Diagnosis not present

## 2014-10-05 DIAGNOSIS — R600 Localized edema: Secondary | ICD-10-CM

## 2014-10-05 DIAGNOSIS — I82409 Acute embolism and thrombosis of unspecified deep veins of unspecified lower extremity: Secondary | ICD-10-CM

## 2014-10-05 DIAGNOSIS — R599 Enlarged lymph nodes, unspecified: Secondary | ICD-10-CM | POA: Insufficient documentation

## 2014-10-05 DIAGNOSIS — I82401 Acute embolism and thrombosis of unspecified deep veins of right lower extremity: Secondary | ICD-10-CM

## 2014-10-05 DIAGNOSIS — I82411 Acute embolism and thrombosis of right femoral vein: Secondary | ICD-10-CM | POA: Insufficient documentation

## 2014-10-05 IMAGING — US US EXTREM LOW VENOUS*R*
1 series · 13 of 24 positions shown · non-contrast
Comparison: None.

CLINICAL DATA: Evaluate DVT.  History of DVT.

EXAM:
RIGHT LOWER EXTREMITY VENOUS DUPLEX ULTRASOUND
TECHNIQUE: Doppler venous assessment of the left lower extremity deep venous
system was performed, including characterization of spectral flow,
compressibility, and phasicity.

[Series 1: us extrem low venous*right* · 0.08mm/px · 13 of 35 slices shown]
[im 1/35]
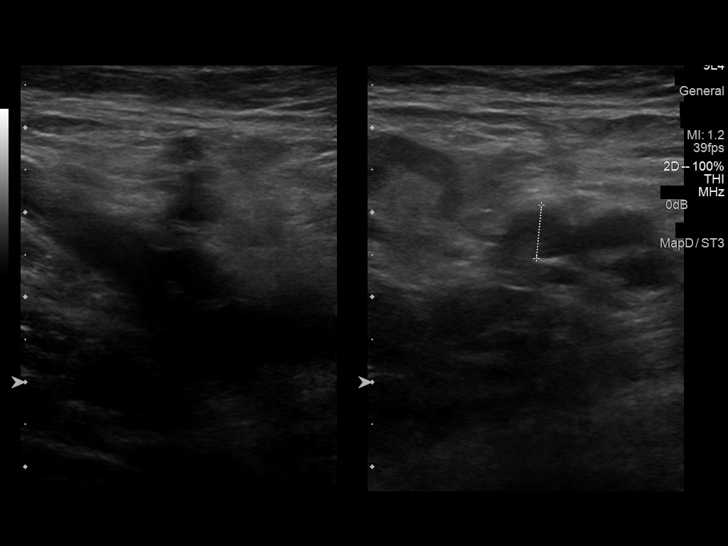
[im 3/35]
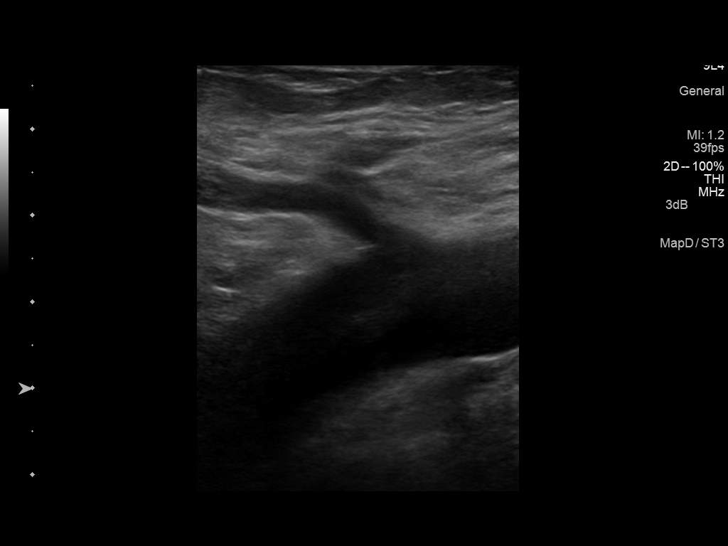
[im 6/35]
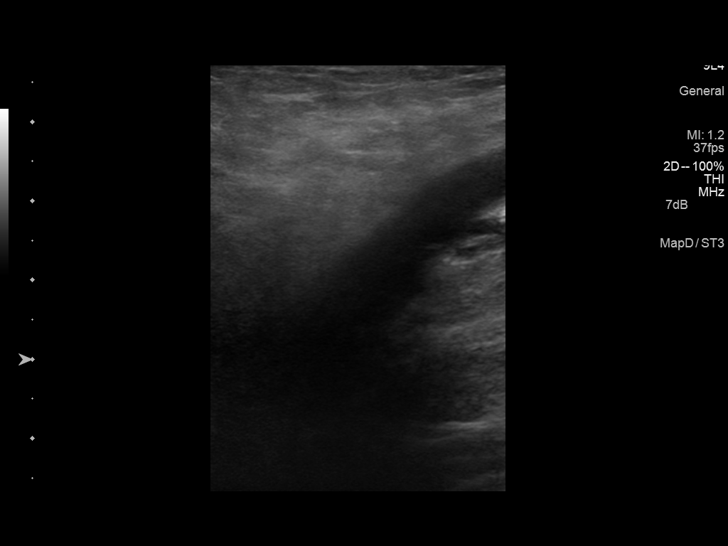
[im 9/35]
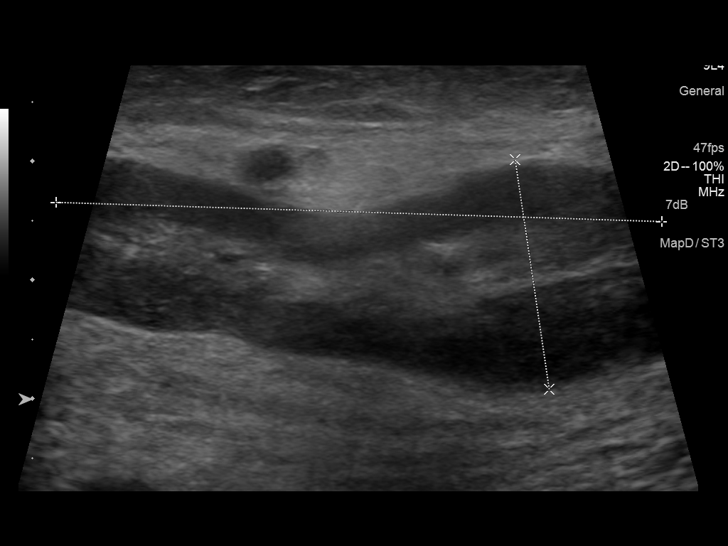
[im 12/35]
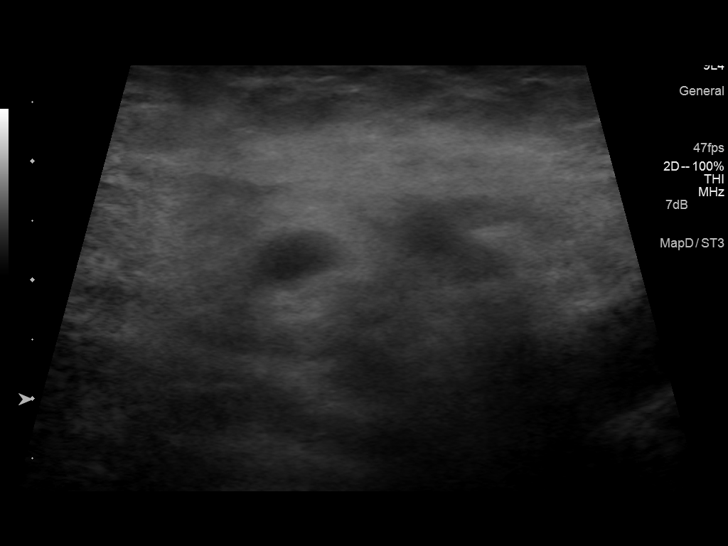
[im 15/35]
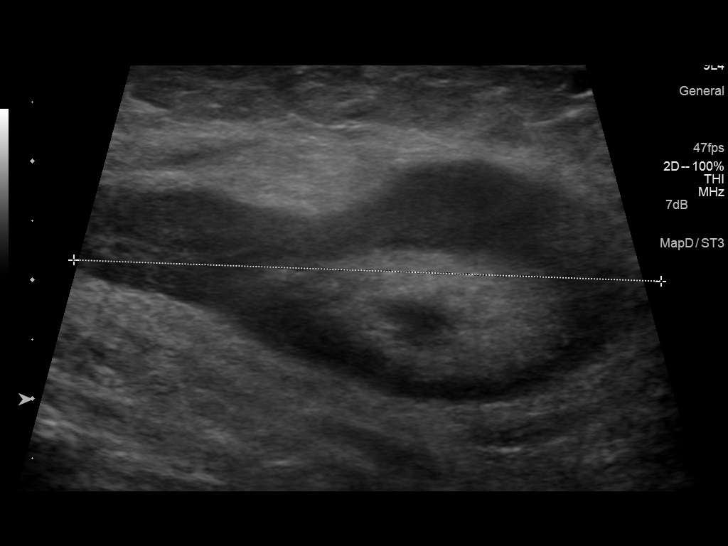
[im 18/35]
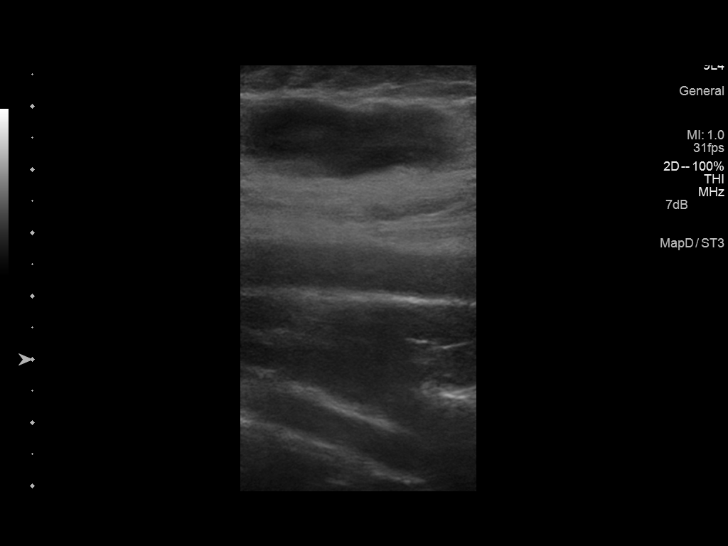
[im 20/35]
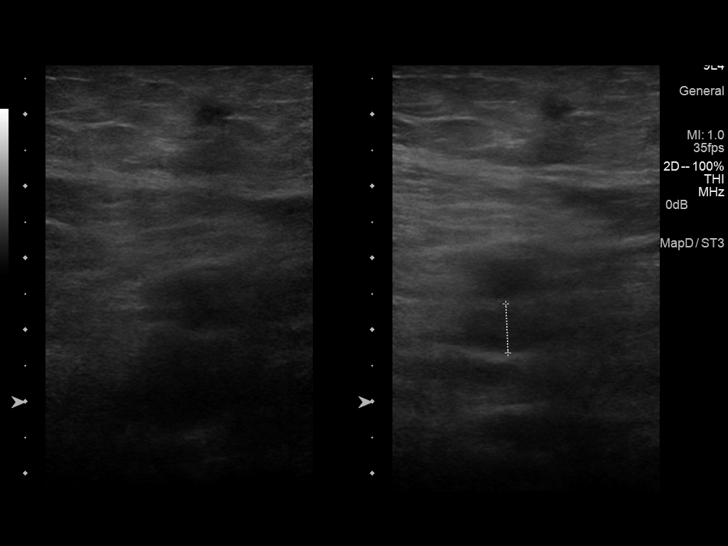
[im 23/35]
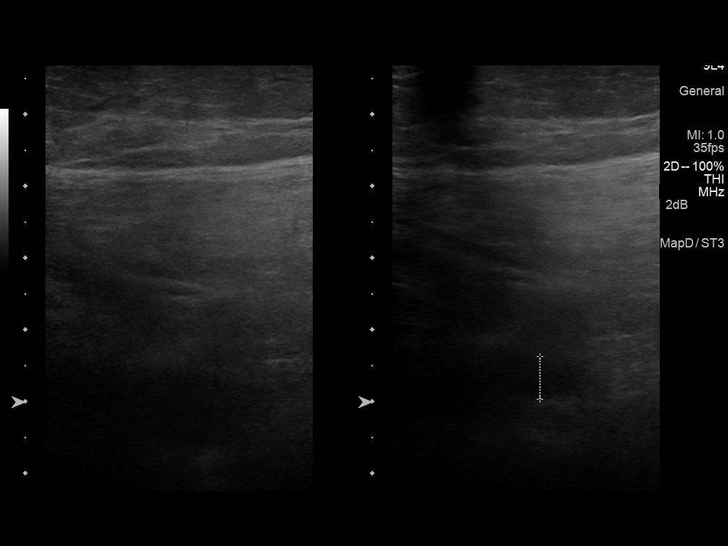
[im 26/35]
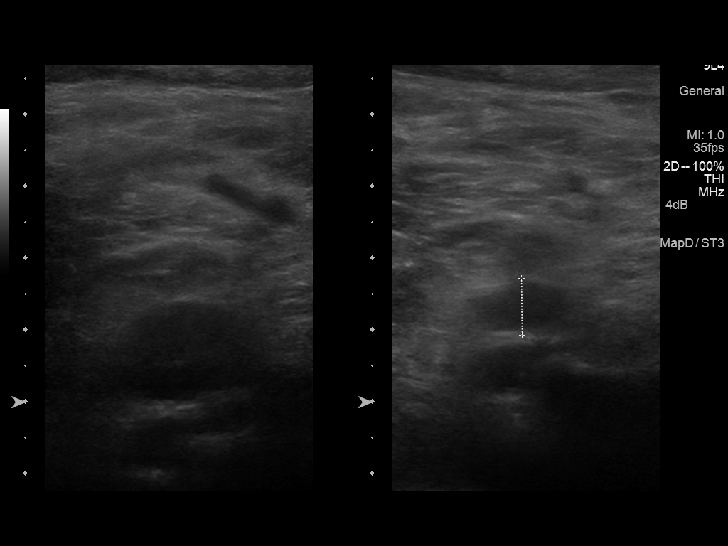
[im 29/35]
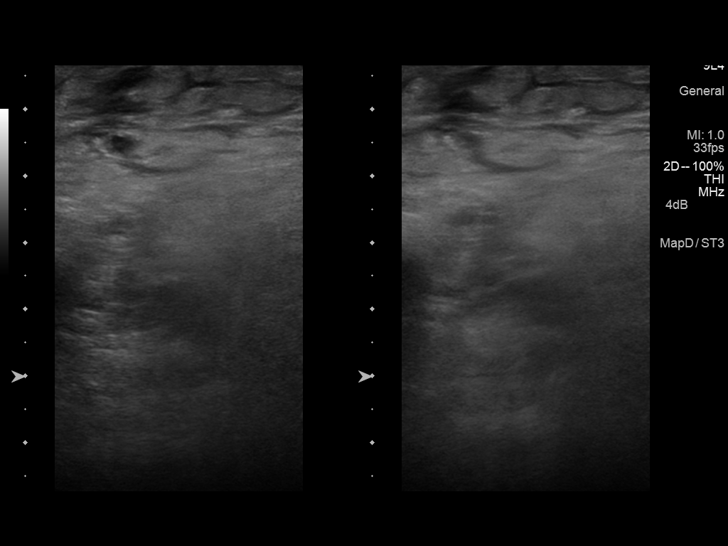
[im 32/35]
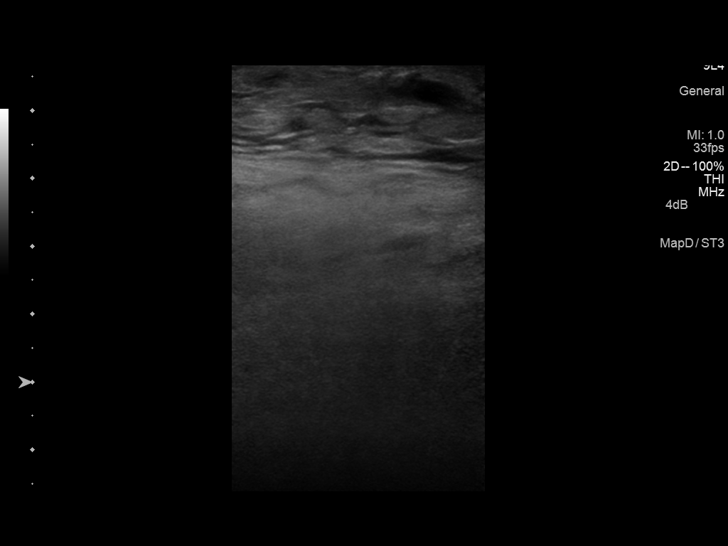
[im 35/35]
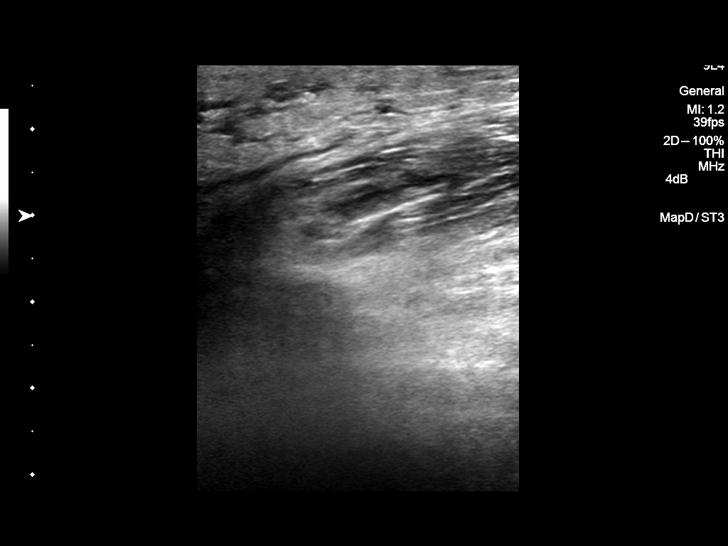

[13 of 24 positions shown; findings below may reference images not displayed]

FINDINGS: There is thrombus throughout the deep venous structures of the right
lower extremity this includes the common femoral, femoral, and
popliteal veins as well as the posterior tibial vein and the greater
saphenous vein (adjacent to the junction to the common femoral
vein). The thrombus is partially occlusive. Eight channel of flow
can be seen through the thrombus. It is partially compressible. This
has a subacute to chronic appearance. Acute DVT cannot be excluded
by this study. The appearance is similar to that of the prior study.
Doppler analysis demonstrates respiratory phasicity augmentation was
deferred. Varicosities in the subcutaneous fat are also identified.
Some of these are thrombosed.

There is an enlarged lymph node in the right groin with a short axis
diameter of 1.6 cm.

Limited imaging in the left lower extremity demonstrates partially
occlusive thrombus in the left common femoral vein
IMPRESSION: The study is positive for DVT. DVT is partially occlusive and does
have a chronic appearance. It is similar to findings noted on the
prior study. Acute DVT cannot be excluded by this study.

Abnormally enlarged lymph node in the right groin is noted.
Inflammatory or neoplastic etiology are not excluded.

## 2014-10-05 IMAGING — US US EXTREM LOW VENOUS*R*
1 series · 10 of 10 positions shown · non-contrast
Comparison: None.

CLINICAL DATA: Evaluate DVT.  History of DVT.

EXAM:
RIGHT LOWER EXTREMITY VENOUS DUPLEX ULTRASOUND
TECHNIQUE: Doppler venous assessment of the left lower extremity deep venous
system was performed, including characterization of spectral flow,
compressibility, and phasicity.

[Series 1: us extrem low venous*right* · 10 of 10 slices shown]
[im 1/10]
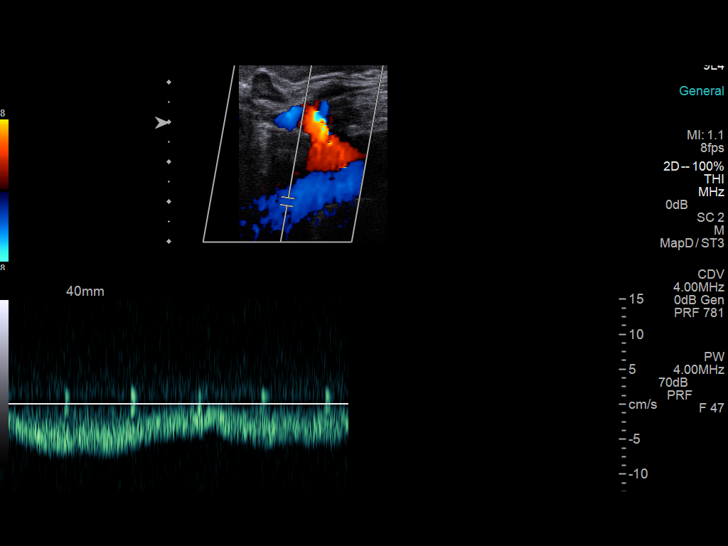
[im 2/10]
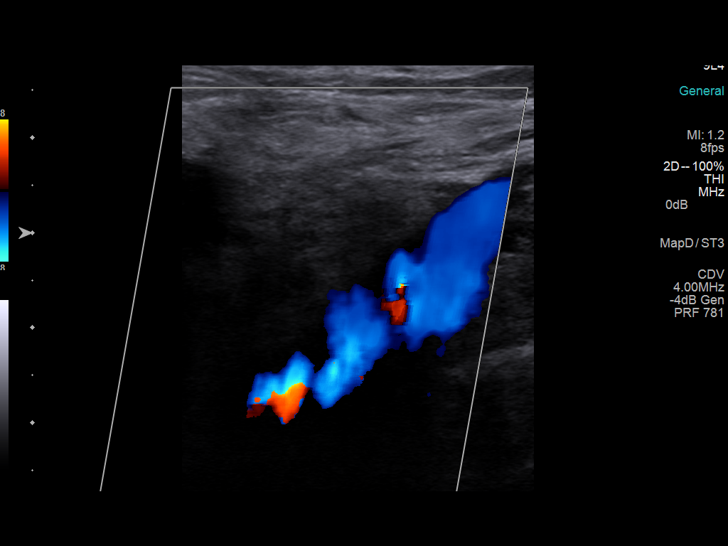
[im 3/10]
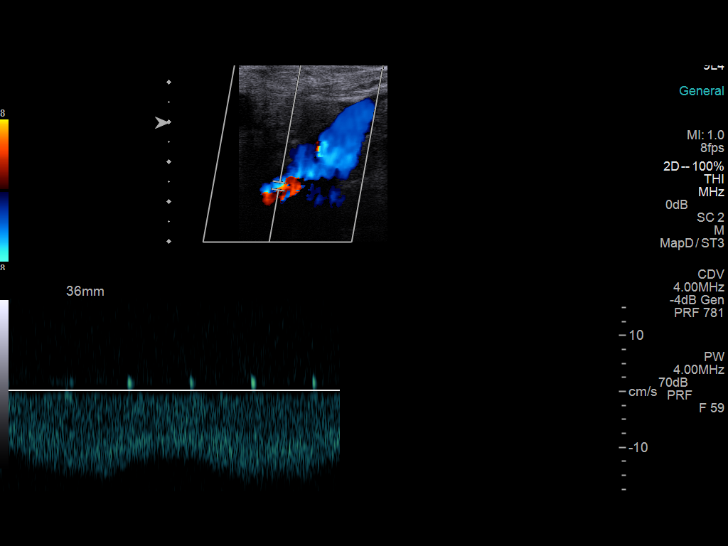
[im 4/10]
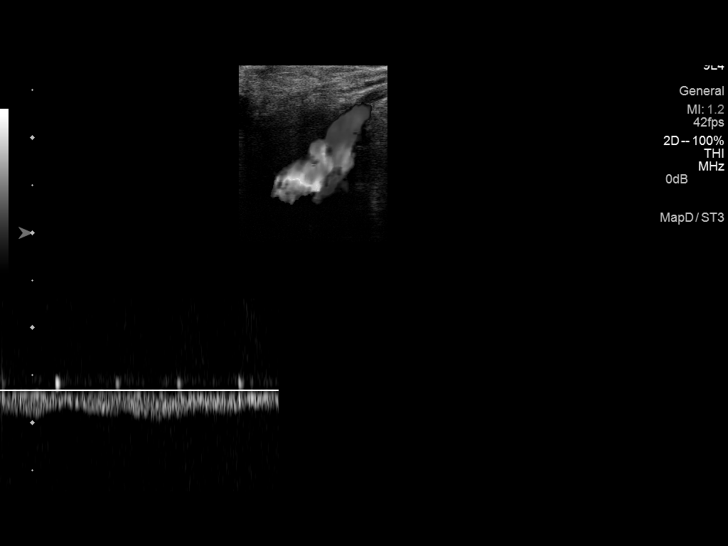
[im 5/10]
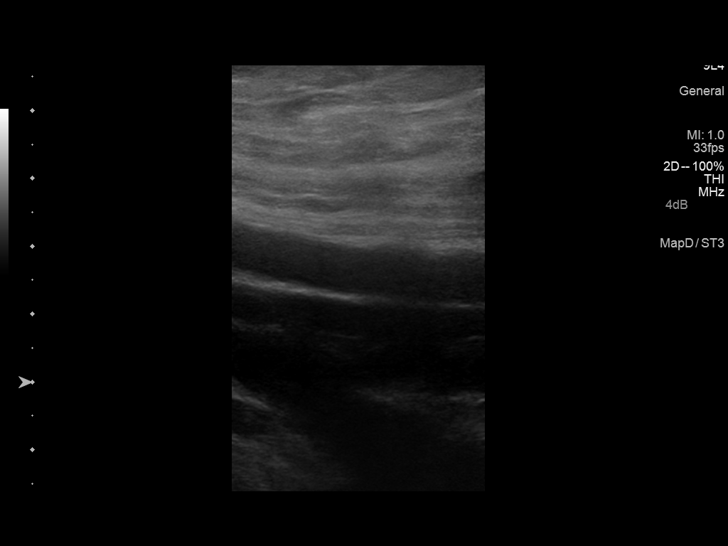
[im 6/10]
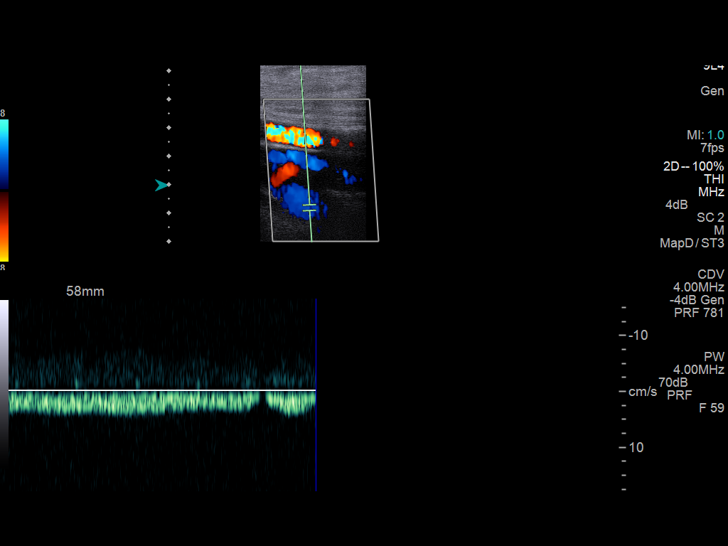
[im 7/10]
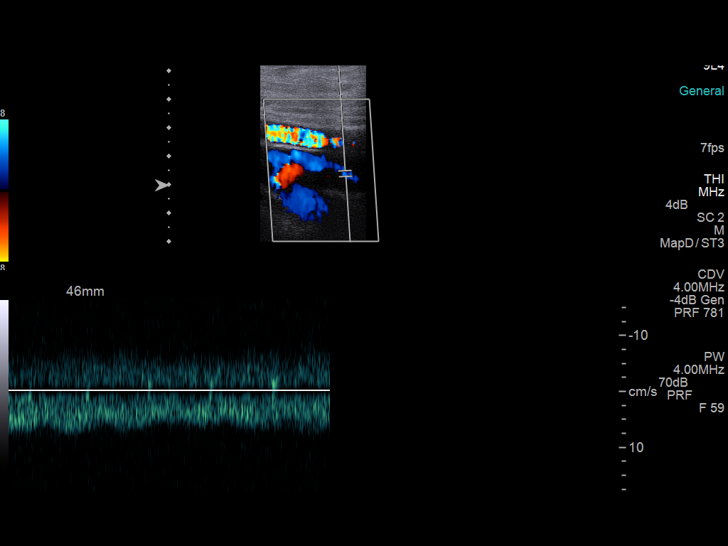
[im 8/10]
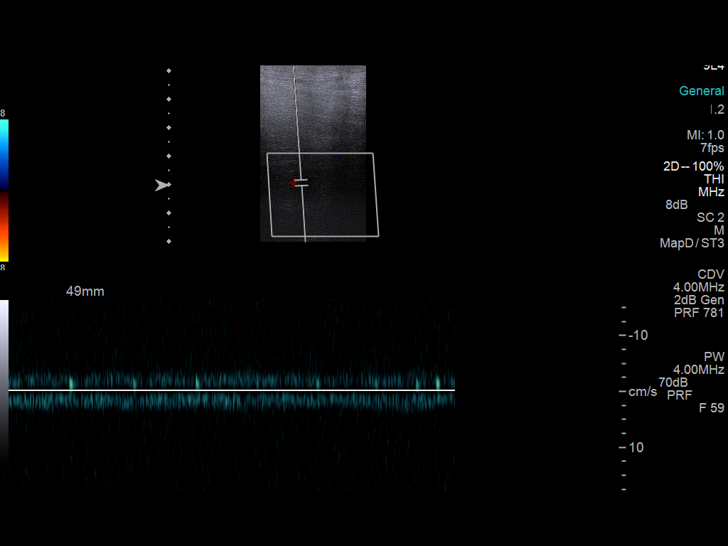
[im 9/10]
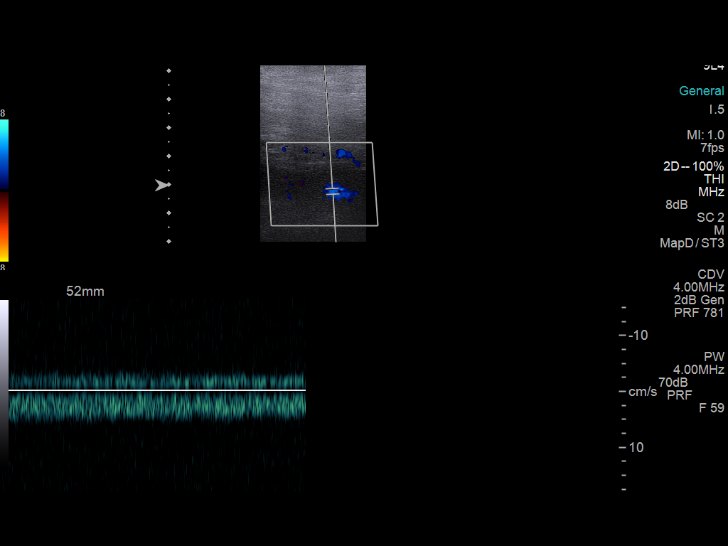
[im 10/10]
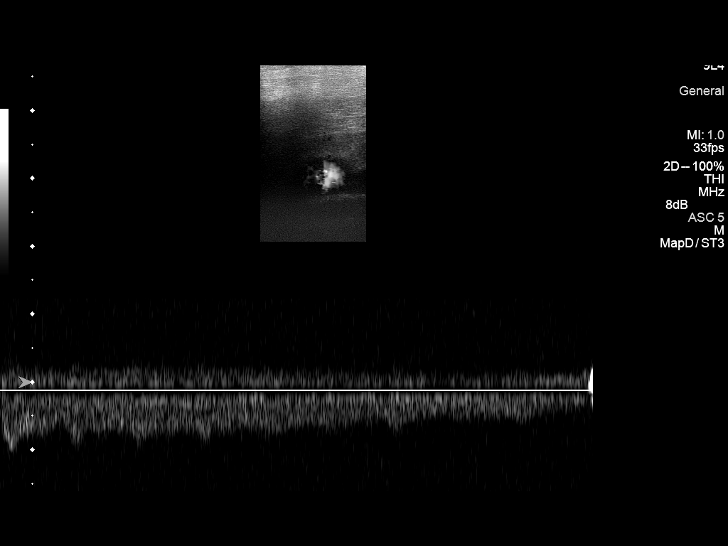

[10 of 10 positions shown; findings below may reference images not displayed]

FINDINGS: There is thrombus throughout the deep venous structures of the right
lower extremity this includes the common femoral, femoral, and
popliteal veins as well as the posterior tibial vein and the greater
saphenous vein (adjacent to the junction to the common femoral
vein). The thrombus is partially occlusive. Eight channel of flow
can be seen through the thrombus. It is partially compressible. This
has a subacute to chronic appearance. Acute DVT cannot be excluded
by this study. The appearance is similar to that of the prior study.
Doppler analysis demonstrates respiratory phasicity augmentation was
deferred. Varicosities in the subcutaneous fat are also identified.
Some of these are thrombosed.

There is an enlarged lymph node in the right groin with a short axis
diameter of 1.6 cm.

Limited imaging in the left lower extremity demonstrates partially
occlusive thrombus in the left common femoral vein
IMPRESSION: The study is positive for DVT. DVT is partially occlusive and does
have a chronic appearance. It is similar to findings noted on the
prior study. Acute DVT cannot be excluded by this study.

Abnormally enlarged lymph node in the right groin is noted.
Inflammatory or neoplastic etiology are not excluded.

## 2014-10-05 MED ORDER — RIVAROXABAN 15 MG PO TABS: 15.0000 mg | ORAL_TABLET | Freq: Two times a day (BID) | ORAL | Status: DC

## 2014-10-08 DIAGNOSIS — M419 Scoliosis, unspecified: Secondary | ICD-10-CM | POA: Diagnosis not present

## 2014-10-08 DIAGNOSIS — R350 Frequency of micturition: Secondary | ICD-10-CM | POA: Diagnosis not present

## 2014-10-09 ENCOUNTER — Ambulatory Visit (HOSPITAL_COMMUNITY): Payer: Medicare Other | Admitting: Physical Therapy

## 2014-10-10 ENCOUNTER — Encounter (HOSPITAL_COMMUNITY): Payer: Self-pay | Admitting: *Deleted

## 2014-10-10 ENCOUNTER — Emergency Department (HOSPITAL_COMMUNITY)
Admission: EM | Admit: 2014-10-10 | Discharge: 2014-10-11 | Disposition: A | Discharge status: Home / Self Care | Payer: Medicare Other | Attending: Emergency Medicine | Admitting: Emergency Medicine

## 2014-10-10 DIAGNOSIS — N10 Acute tubulo-interstitial nephritis: Secondary | ICD-10-CM | POA: Insufficient documentation

## 2014-10-10 DIAGNOSIS — R000 Tachycardia, unspecified: Secondary | ICD-10-CM | POA: Insufficient documentation

## 2014-10-10 DIAGNOSIS — Z86711 Personal history of pulmonary embolism: Secondary | ICD-10-CM | POA: Diagnosis not present

## 2014-10-10 DIAGNOSIS — R509 Fever, unspecified: Secondary | ICD-10-CM | POA: Diagnosis not present

## 2014-10-10 DIAGNOSIS — R5383 Other fatigue: Secondary | ICD-10-CM | POA: Diagnosis present

## 2014-10-10 DIAGNOSIS — Z86718 Personal history of other venous thrombosis and embolism: Secondary | ICD-10-CM | POA: Insufficient documentation

## 2014-10-10 DIAGNOSIS — Z791 Long term (current) use of non-steroidal anti-inflammatories (NSAID): Secondary | ICD-10-CM | POA: Diagnosis not present

## 2014-10-10 DIAGNOSIS — Z7902 Long term (current) use of antithrombotics/antiplatelets: Secondary | ICD-10-CM | POA: Diagnosis not present

## 2014-10-10 DIAGNOSIS — Z720 Tobacco use: Secondary | ICD-10-CM | POA: Diagnosis not present

## 2014-10-10 DIAGNOSIS — N12 Tubulo-interstitial nephritis, not specified as acute or chronic: Secondary | ICD-10-CM

## 2014-10-10 DIAGNOSIS — Q269 Congenital malformation of great vein, unspecified: Secondary | ICD-10-CM | POA: Diagnosis not present

## 2014-10-10 DIAGNOSIS — N179 Acute kidney failure, unspecified: Secondary | ICD-10-CM

## 2014-10-10 DIAGNOSIS — I82401 Acute embolism and thrombosis of unspecified deep veins of right lower extremity: Secondary | ICD-10-CM | POA: Diagnosis not present

## 2014-10-10 LAB — PROTIME-INR
INR: 2.33 — ABNORMAL HIGH (ref 0.00–1.49)
Prothrombin Time: 25.8 seconds — ABNORMAL HIGH (ref 11.6–15.2)

## 2014-10-10 LAB — LACTIC ACID, PLASMA: Lactic Acid, Venous: 1.8 mmol/L (ref 0.5–2.2)

## 2014-10-10 MED ORDER — ACETAMINOPHEN 500 MG PO TABS
ORAL_TABLET | ORAL | Status: AC
  Filled 2014-10-10: qty 2

## 2014-10-10 MED ORDER — ACETAMINOPHEN 500 MG PO TABS
1000.0000 mg | ORAL_TABLET | Freq: Once | ORAL | Status: AC
  Administered 2014-10-10: 1000 mg via ORAL

## 2014-10-10 MED ORDER — SODIUM CHLORIDE 0.9 % IV BOLUS (SEPSIS)
1000.0000 mL | Freq: Once | INTRAVENOUS | Status: AC
  Administered 2014-10-11: 1000 mL via INTRAVENOUS

## 2014-10-10 NOTE — ED Notes (Addendum)
Pt states he is being treated for a blood clot in his right leg and a urinary tract infection. Pt states he has just felt weak the last several days. Pt also has a fever.

## 2014-10-10 NOTE — ED Provider Notes (Signed)
CSN: [id number]     Arrival date & time 10/10/14  2243 History   First MD Initiated Contact with Patient 10/10/14 2320     Chief Complaint  Patient presents with  . Fatigue     (Consider location/radiation/quality/duration/timing/severity/associated sxs/prior Treatment) HPI  This is a 53 year old male with a history of DVT on Xarelto who presents with generalized fatigue and fever. Patient states that he has not been feeling well for the last 3-4 days. He was seen in urgent care on Monday and given ciprofloxacin for possible urinary tract infection. He states that since that time he has had chills and "I just feel bad." He reports decreased energy. He reports continued urinary frequency and hematuria. Denies any back pain. Noted to have a fever here of 102.1. Denies any cough, upper respiratory symptoms, chest pain, abdominal pain. Patient was just switch from Coumadin to Xarelto on Monday.  Past Medical History  Diagnosis Date  . DVT of leg (deep venous thrombosis)     rt. leg  . Inferior vena caval thrombosis     Chronic  . DVT (deep venous thrombosis) 06/15/2011  . H/O PE (pulmonary embolism) 06/15/2011  . 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 No pertinent past surgical history. Family History  Problem Relation Age of Onset  . COPD Father   . Stroke Brother   . Cancer Brother    History  Substance Use Topics  . Smoking status: Current Every Day Smoker -- 1.00 packs/day for 30 years    Types: Cigarettes  . Smokeless tobacco: Never Used  . Alcohol Use: 14.4 oz/week    24 Cans of beer per week    Review of Systems  Constitutional: Positive for fever, chills and fatigue.  Respiratory: Negative.  Negative for chest tightness and shortness of breath.   Cardiovascular: Negative.  Negative for chest pain.  Gastrointestinal: Negative.  Negative for abdominal pain.  Genitourinary: Positive for dysuria, frequency and hematuria. Negative for flank  pain.  Musculoskeletal: Negative for back pain.  All other systems reviewed and are negative.     Allergies  Review of patient's allergies indicates no known allergies.  Home Medications   Prior to Admission medications   Medication Sig Start Date End Date Taking? Authorizing Provider  naproxen (NAPROSYN) 500 MG tablet Take 1 tablet (500 mg total) by mouth 2 (two) times daily with a meal. 10/02/13   Baird Cancer, PA-C  oxycodone (OXY-IR) 5 MG capsule Take 2 capsules (10 mg total) by mouth every 4 (four) hours as needed for pain. 09/28/14   Baird Cancer, PA-C  Rivaroxaban (XARELTO) 15 MG TABS tablet Take 1 tablet (15 mg total) by mouth 2 (two) times daily with a meal. 10/05/14   Baird Cancer, PA-C  sulfamethoxazole-trimethoprim (SEPTRA DS) 800-160 MG per tablet Take 1 tablet by mouth every 12 (twelve) hours. 10/11/14   Merryl Hacker, MD   BP 123/70 mmHg  Pulse 67  Temp(Src) 98.6 F (37 C) (Oral)  Resp 20  Ht 6\' 1"  (1.854 m)  Wt 285 lb (129.275 kg)  BMI 37.61 kg/m2  SpO2 97% Physical Exam  Constitutional: He is oriented to person, place, and time. No distress.  Ill-appearing, but nontoxic  HENT:  Head: Normocephalic and atraumatic.  Eyes: Pupils are equal, round, and reactive to light.  Cardiovascular: Regular rhythm and normal heart sounds.   No murmur heard. tachycardia  Pulmonary/Chest: Effort normal and breath sounds normal. No respiratory distress. He has  no wheezes.  Abdominal: Soft. Bowel sounds are normal. There is no tenderness. There is no rebound and no guarding.  Genitourinary:  No CVA tenderness  Musculoskeletal: He exhibits edema.  Bilateral lower extremity edema, healing wound of her right lower extremity without evidence of cellulitis  Neurological: He is alert and oriented to person, place, and time.  Skin: Skin is warm and dry.  Psychiatric: He has a normal mood and affect.  Nursing note and vitals reviewed.   ED Course  Procedures  (including critical care time) Labs Review Labs Reviewed  CBC WITH DIFFERENTIAL - Abnormal; Notable for the following:    WBC 13.2 (*)    HCT 38.9 (*)    MCHC 36.5 (*)    Neutrophils Relative % 79 (*)    Neutro Abs 10.4 (*)    Monocytes Absolute 1.1 (*)    All other components within normal limits  COMPREHENSIVE METABOLIC PANEL - Abnormal; Notable for the following:    Sodium 131 (*)    Potassium 3.4 (*)    Chloride 91 (*)    Glucose, Bld 156 (*)    Creatinine, Ser 1.44 (*)    Albumin 3.1 (*)    Total Bilirubin 1.8 (*)    GFR calc non Af Amer 55 (*)    GFR calc Af Amer 64 (*)    Anion gap 17 (*)    All other components within normal limits  PROTIME-INR - Abnormal; Notable for the following:    Prothrombin Time 25.8 (*)    INR 2.33 (*)    All other components within normal limits  URINALYSIS, ROUTINE W REFLEX MICROSCOPIC - Abnormal; Notable for the following:    Color, Urine AMBER (*)    Hgb urine dipstick MODERATE (*)    Bilirubin Urine MODERATE (*)    Protein, ur 100 (*)    Urobilinogen, UA >8.0 (*)    All other components within normal limits  URINE MICROSCOPIC-ADD ON - Abnormal; Notable for the following:    Squamous Epithelial / LPF MANY (*)    Bacteria, UA MANY (*)    Casts HYALINE CASTS (*)    All other components within normal limits  COMPREHENSIVE METABOLIC PANEL - Abnormal; Notable for the following:    Sodium 134 (*)    Potassium 3.2 (*)    Chloride 94 (*)    Glucose, Bld 128 (*)    Creatinine, Ser 1.38 (*)    Albumin 3.0 (*)    Total Bilirubin 1.7 (*)    GFR calc non Af Amer 58 (*)    GFR calc Af Amer 67 (*)    Anion gap 16 (*)    All other components within normal limits  CULTURE, BLOOD (ROUTINE X 2)  CULTURE, BLOOD (ROUTINE X 2)  URINE CULTURE  LACTIC ACID, PLASMA    Imaging Review Dg Chest 2 View  10/11/2014   CLINICAL DATA:  Pt states he is currently being treated for a DVT in his Rt lower extremity and a UTI. Pt c/o weakness and fatigue x  3-4 days. Fever and profuse sweating tonight. Hx IVC thrombosis, PE, congenital IVC interruption, smoker  EXAM: CHEST  2 VIEW  COMPARISON:  Chest CT 03/19/2011  FINDINGS: Lungs are adequately inflated without focal consolidation or effusion. Cardiomediastinal silhouette is within normal. Remaining bones and soft tissues are normal.  IMPRESSION: No active cardiopulmonary disease.   Electronically Signed   By: Marin Olp M.D.   On: 10/11/2014 02:01  EKG Interpretation None      MDM   Final diagnoses:  Fever  Pyelonephritis  AKI (acute kidney injury)    Patient presents with several day history of generalized weakness and fatigue. Noted to be febrile here to 102.1. Ill-appearing but nontoxic on exam.  Reports that he is currently taking ciprofloxacin for urinary tract infection. Denies any flank back pain. Denies any upper respiratory symptoms. Patient given fluids. Basic labwork and cultures obtained.  Lab work notable for mild acute kidney injury with a creatinine of 1.4, baseline 1.1. Lactate is normal.  Leukocytosis to 13.2.Urinalysis shows many bacteria and 0-2 white cells. Patient is currently on ciprofloxacin but given fever and suspicion for urinary source/pyelonephritis, will give IV Rocephin.  On recheck, patient reports that he feels much better. Discussed with patient admission for IV antibiotics for possible early pyelonephritis versus discharge and close follow-up. Patient does not want to be admitted. After fluids, vital signs are improved and he is no longer febrile.  Given recent UTI and fever, suspect pyelonephritis although the patient has no flank pain. Will switch to Bactrim twice a day for 14 days (no longer on coumadin). Discussed with patient close follow-up. BMP shows mild AKI and evidence of dehydration.  Patient will need repeat lab work for normalization. Has previously seeing Dr. Gerarda Fraction.  Discussed with patient at length strict return precautions including dehydration,  persistent fevers, development of nausea, vomiting, flank pain, or any new or worsening symptoms. Patient stated understanding. Cultures are pending.  After history, exam, and medical workup I feel the patient has been appropriately medically screened and is safe for discharge home. Pertinent diagnoses were discussed with the patient. Patient was given return precautions.     Merryl Hacker, MD 10/11/14 [phone]

## 2014-10-11 ENCOUNTER — Emergency Department (HOSPITAL_COMMUNITY): Payer: Medicare Other

## 2014-10-11 ENCOUNTER — Ambulatory Visit (HOSPITAL_COMMUNITY): Payer: Medicare Other | Admitting: Physical Therapy

## 2014-10-11 DIAGNOSIS — I82401 Acute embolism and thrombosis of unspecified deep veins of right lower extremity: Secondary | ICD-10-CM | POA: Diagnosis not present

## 2014-10-11 LAB — COMPREHENSIVE METABOLIC PANEL
ALBUMIN: 3 g/dL — AB (ref 3.5–5.2)
ALK PHOS: 79 U/L (ref 39–117)
ALT: 31 U/L (ref 0–53)
ALT: 31 U/L (ref 0–53)
ANION GAP: 16 — AB (ref 5–15)
AST: 36 U/L (ref 0–37)
AST: 35 U/L (ref 0–37)
Albumin: 3.1 g/dL — ABNORMAL LOW (ref 3.5–5.2)
Alkaline Phosphatase: 81 U/L (ref 39–117)
Anion gap: 17 — ABNORMAL HIGH (ref 5–15)
BILIRUBIN TOTAL: 1.7 mg/dL — AB (ref 0.3–1.2)
BUN: 15 mg/dL (ref 6–23)
BUN: 14 mg/dL (ref 6–23)
CHLORIDE: 94 meq/L — AB (ref 96–112)
CO2: 23 mEq/L (ref 19–32)
CO2: 24 mEq/L (ref 19–32)
Calcium: 9 mg/dL (ref 8.4–10.5)
Calcium: 8.8 mg/dL (ref 8.4–10.5)
Chloride: 91 mEq/L — ABNORMAL LOW (ref 96–112)
Creatinine, Ser: 1.44 mg/dL — ABNORMAL HIGH (ref 0.50–1.35)
Creatinine, Ser: 1.38 mg/dL — ABNORMAL HIGH (ref 0.50–1.35)
GFR calc Af Amer: 64 mL/min — ABNORMAL LOW (ref >90)
GFR calc Af Amer: 67 mL/min — ABNORMAL LOW (ref >90)
GFR calc non Af Amer: 55 mL/min — ABNORMAL LOW (ref >90)
GFR calc non Af Amer: 58 mL/min — ABNORMAL LOW (ref >90)
Glucose, Bld: 156 mg/dL — ABNORMAL HIGH (ref 70–99)
Glucose, Bld: 128 mg/dL — ABNORMAL HIGH (ref 70–99)
POTASSIUM: 3.4 meq/L — AB (ref 3.7–5.3)
POTASSIUM: 3.2 meq/L — AB (ref 3.7–5.3)
SODIUM: 131 meq/L — AB (ref 137–147)
SODIUM: 134 meq/L — AB (ref 137–147)
TOTAL PROTEIN: 8.3 g/dL (ref 6.0–8.3)
Total Bilirubin: 1.8 mg/dL — ABNORMAL HIGH (ref 0.3–1.2)
Total Protein: 8 g/dL (ref 6.0–8.3)

## 2014-10-11 LAB — URINALYSIS, ROUTINE W REFLEX MICROSCOPIC
Glucose, UA: NEGATIVE mg/dL
Ketones, ur: NEGATIVE mg/dL
Leukocytes, UA: NEGATIVE
Nitrite: NEGATIVE
Protein, ur: 100 mg/dL — AB
SPECIFIC GRAVITY, URINE: 1.02 (ref 1.005–1.030)
Urobilinogen, UA: >8 mg/dL — ABNORMAL HIGH (ref 0.0–1.0)
pH: 5.5 (ref 5.0–8.0)

## 2014-10-11 LAB — URINE MICROSCOPIC-ADD ON

## 2014-10-11 LAB — CBC WITH DIFFERENTIAL/PLATELET
BASOS ABS: 0.1 10*3/uL (ref 0.0–0.1)
Basophils Relative: 1 % (ref 0–1)
Eosinophils Absolute: 0 10*3/uL (ref 0.0–0.7)
Eosinophils Relative: 0 % (ref 0–5)
HEMATOCRIT: 38.9 % — AB (ref 39.0–52.0)
HEMOGLOBIN: 14.2 g/dL (ref 13.0–17.0)
LYMPHS PCT: 12 % (ref 12–46)
Lymphs Abs: 1.6 10*3/uL (ref 0.7–4.0)
MCH: 30.7 pg (ref 26.0–34.0)
MCHC: 36.5 g/dL — ABNORMAL HIGH (ref 30.0–36.0)
MCV: 84 fL (ref 78.0–100.0)
MONOS PCT: 8 % (ref 3–12)
Monocytes Absolute: 1.1 10*3/uL — ABNORMAL HIGH (ref 0.1–1.0)
NEUTROS ABS: 10.4 10*3/uL — AB (ref 1.7–7.7)
Neutrophils Relative %: 79 % — ABNORMAL HIGH (ref 43–77)
Platelets: 241 10*3/uL (ref 150–400)
RBC: 4.63 MIL/uL (ref 4.22–5.81)
RDW: 13.1 % (ref 11.5–15.5)
Smear Review: DECREASED
WBC: 13.2 10*3/uL — AB (ref 4.0–10.5)

## 2014-10-11 IMAGING — CR DG CHEST 2V
2 series · 2 of 2 positions shown · non-contrast
Comparison: Chest CT [DATE]

CLINICAL DATA: Pt states he is currently being treated for a DVT in
his Rt lower extremity and a UTI. Pt c/o weakness and fatigue x 3-4
days. Fever and profuse sweating tonight. Hx IVC thrombosis, PE,
congenital IVC interruption, smoker

EXAM:
CHEST  2 VIEW

[view not recorded (1 of 2)]
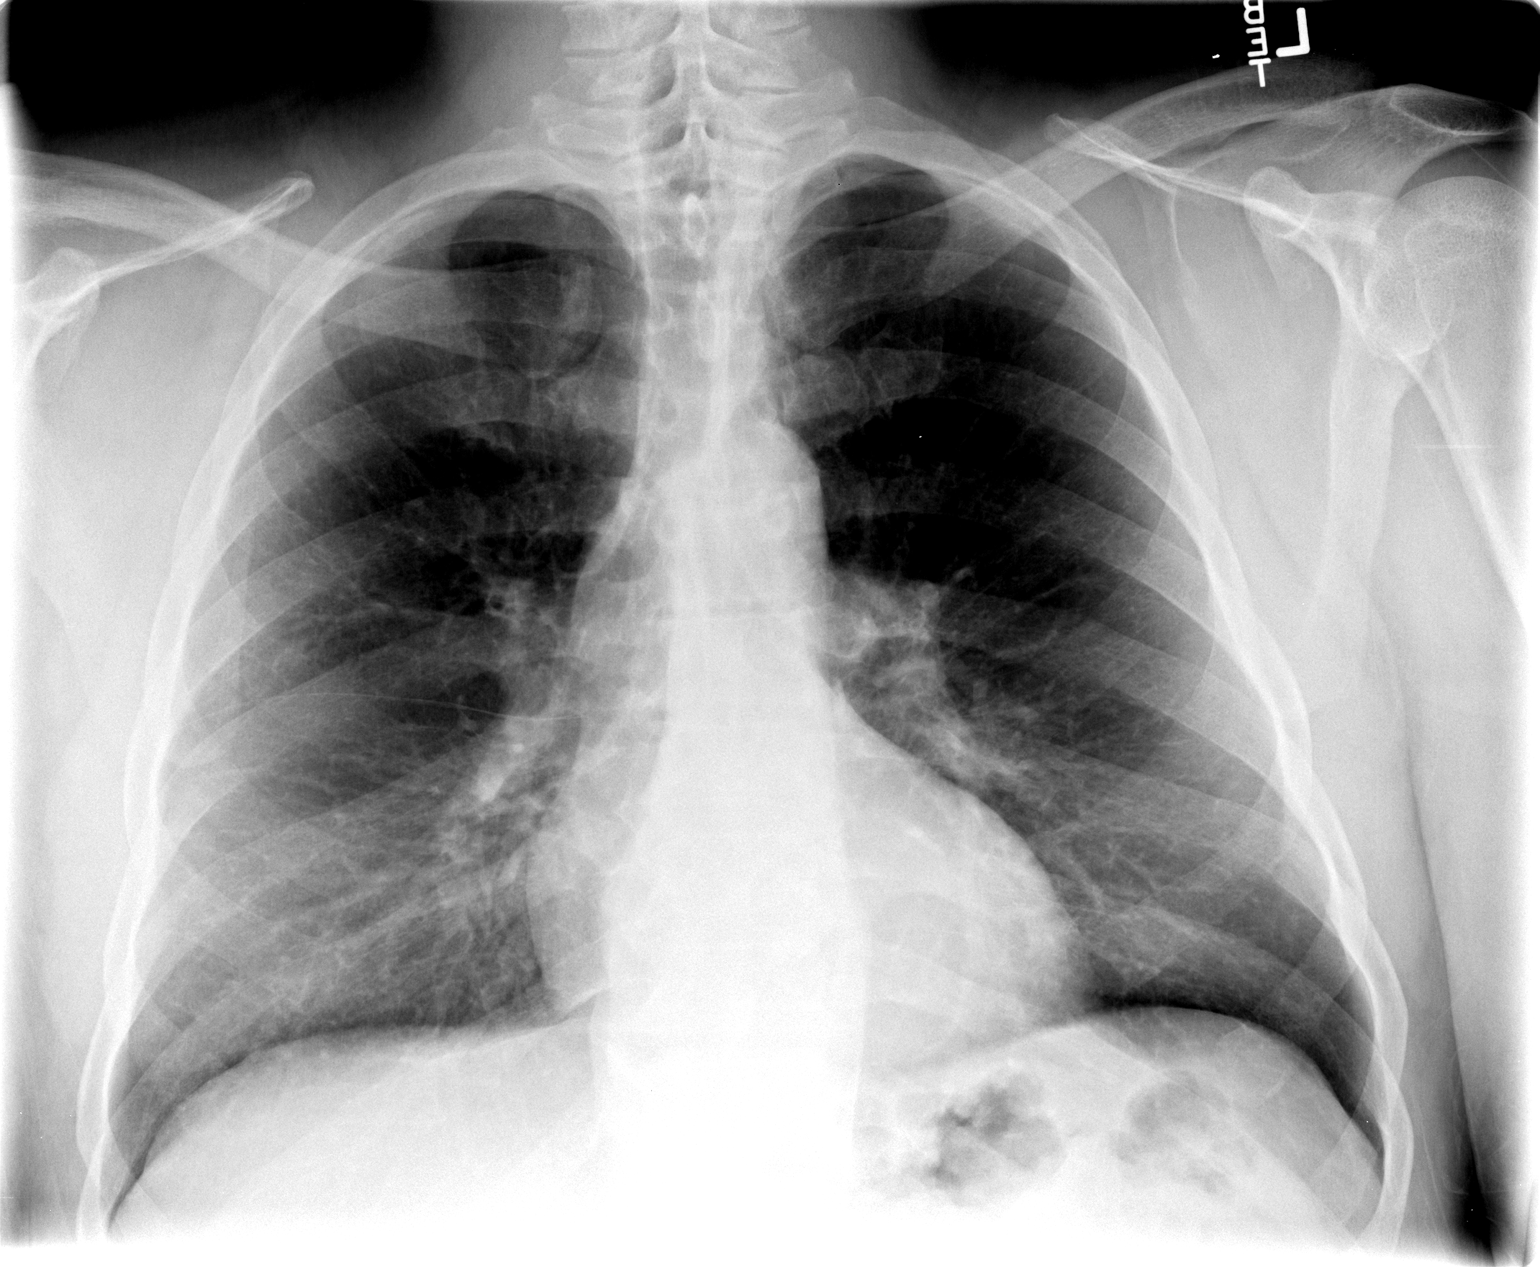

[view not recorded (2 of 2)]
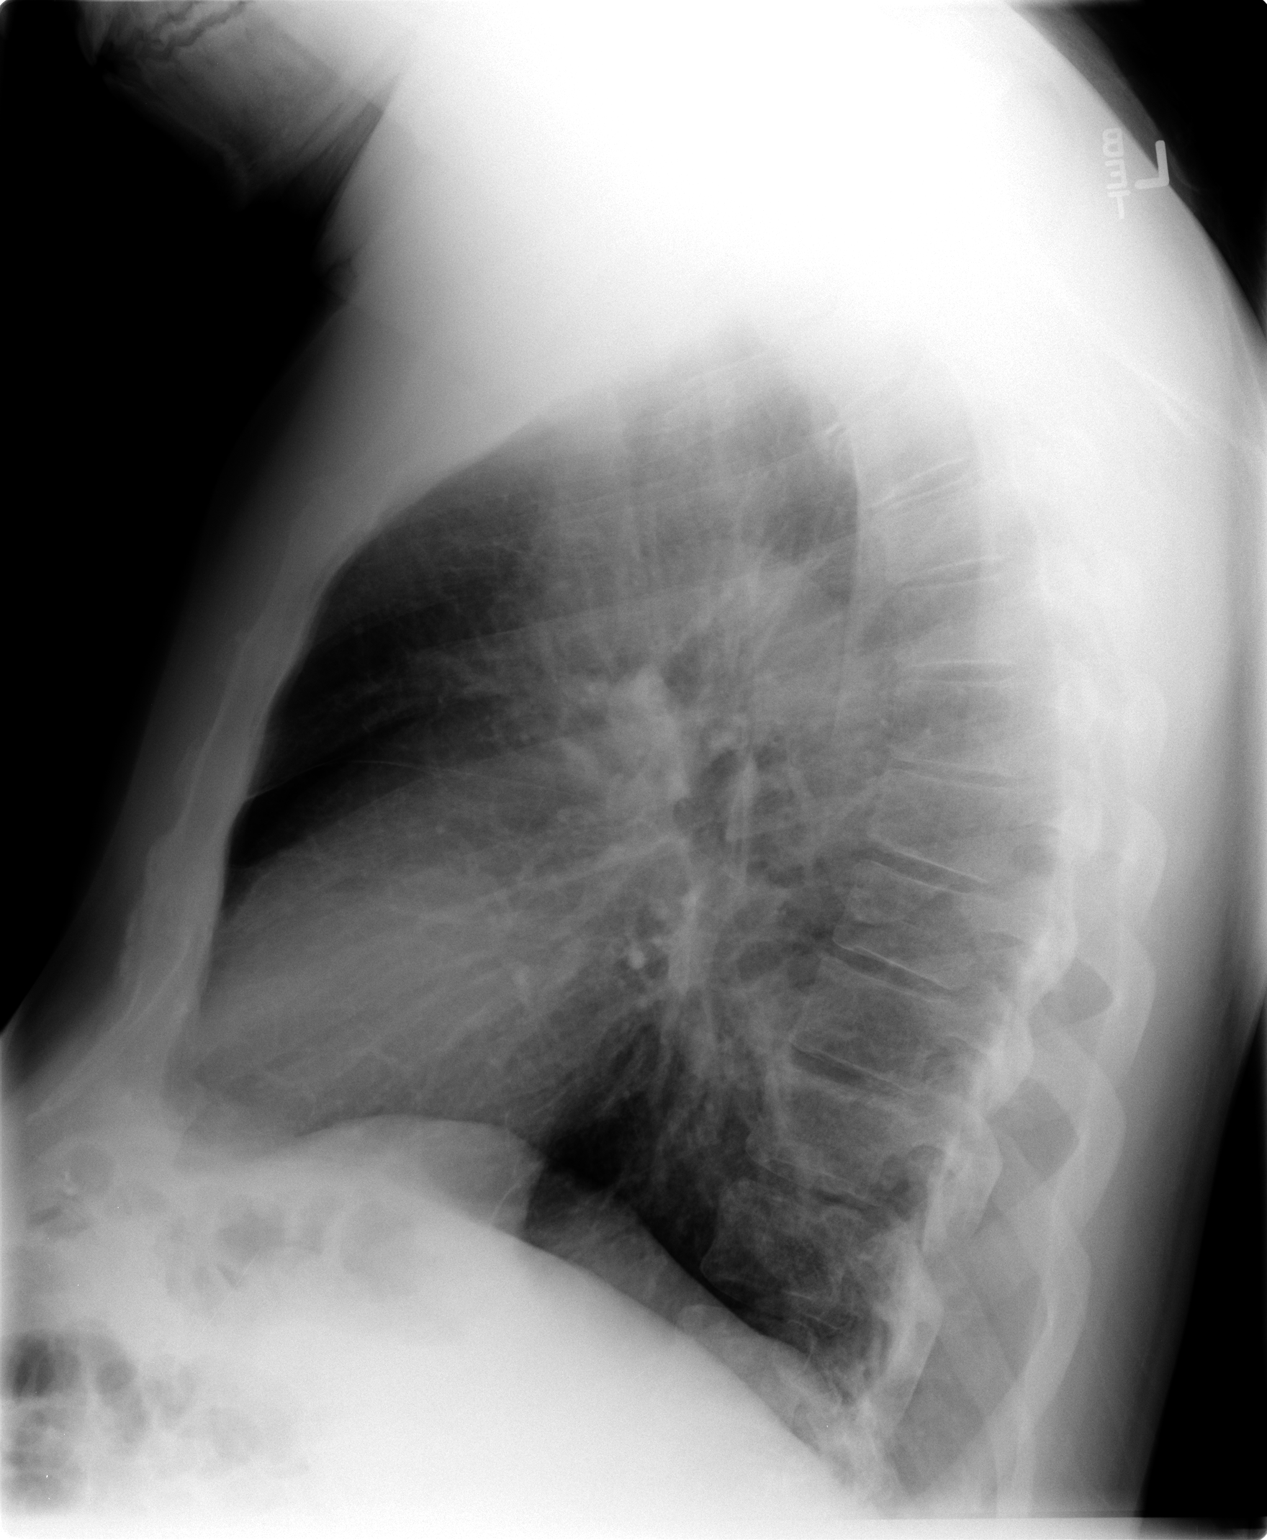

[2 of 2 positions shown; findings below may reference images not displayed]

FINDINGS: Lungs are adequately inflated without focal consolidation or
effusion. Cardiomediastinal silhouette is within normal. Remaining
bones and soft tissues are normal.
IMPRESSION: No active cardiopulmonary disease.

## 2014-10-11 MED ORDER — SULFAMETHOXAZOLE-TRIMETHOPRIM 800-160 MG PO TABS: 1.0000 | ORAL_TABLET | Freq: Two times a day (BID) | ORAL | Status: DC

## 2014-10-11 MED ORDER — DEXTROSE 5 % IV SOLN
1.0000 g | Freq: Once | INTRAVENOUS | Status: AC
  Administered 2014-10-11: 1 g via INTRAVENOUS
  Filled 2014-10-11: qty 10

## 2014-10-11 NOTE — Discharge Instructions (Signed)
You were seen today for fever. You likely have an infection of your kidneys.  He will be switched from ciprofloxacin to Bactrim given that you are not taking Coumadin anymore.  He need to continue to monitor for persistent fevers. You should aggressively hydrate. If he has persistent fevers, begin to have back pain, nausea, vomiting, or any other new or worsening symptoms, you should be evaluated immediately. He need to follow-up in 2-3 days and have your kidney function rechecked.  Pyelonephritis, Adult Pyelonephritis is a kidney infection. In general, there are 2 main types of pyelonephritis:  Infections that come on quickly without any warning (acute pyelonephritis).  Infections that persist for a long period of time (chronic pyelonephritis). CAUSES  Two main causes of pyelonephritis are:  Bacteria traveling from the bladder to the kidney. This is a problem especially in pregnant women. The urine in the bladder can become filled with bacteria from multiple causes, including:  Inflammation of the prostate gland (prostatitis).  Sexual intercourse in females.  Bladder infection (cystitis).  Bacteria traveling from the bloodstream to the tissue part of the kidney. Problems that may increase your risk of getting a kidney infection include:  Diabetes.  Kidney stones or bladder stones.  Cancer.  Catheters placed in the bladder.  Other abnormalities of the kidney or ureter. SYMPTOMS   Abdominal pain.  Pain in the side or flank area.  Fever.  Chills.  Upset stomach.  Blood in the urine (dark urine).  Frequent urination.  Strong or persistent urge to urinate.  Burning or stinging when urinating. DIAGNOSIS  Your caregiver may diagnose your kidney infection based on your symptoms. A urine sample may also be taken. TREATMENT  In general, treatment depends on how severe the infection is.   If the infection is mild and caught early, your caregiver may treat you with oral  antibiotics and send you home.  If the infection is more severe, the bacteria may have gotten into the bloodstream. This will require intravenous (IV) antibiotics and a hospital stay. Symptoms may include:  High fever.  Severe flank pain.  Shaking chills.  Even after a hospital stay, your caregiver may require you to be on oral antibiotics for a period of time.  Other treatments may be required depending upon the cause of the infection. HOME CARE INSTRUCTIONS   Take your antibiotics as directed. Finish them even if you start to feel better.  Make an appointment to have your urine checked to make sure the infection is gone.  Drink enough fluids to keep your urine clear or pale yellow.  Take medicines for the bladder if you have urgency and frequency of urination as directed by your caregiver. SEEK IMMEDIATE MEDICAL CARE IF:   You have a fever or persistent symptoms for more than 2-3 days.  You have a fever and your symptoms suddenly get worse.  You are unable to take your antibiotics or fluids.  You develop shaking chills.  You experience extreme weakness or fainting.  There is no improvement after 2 days of treatment. MAKE SURE YOU:  Understand these instructions.  Will watch your condition.  Will get help right away if you are not doing well or get worse. Document Released: 11/16/2005 Document Revised: 05/17/2012 Document Reviewed: 04/22/2011 [facility] Patient Information 2015 [place], [place]. This information is not intended to replace advice given to you by your health care provider. Make sure you discuss any questions you have with your health care provider.

## 2014-10-11 NOTE — ED Notes (Signed)
Patient verbalizes understanding of discharge instructions, home care, prescription medications and follow up care. Patient out of department at this time with family.

## 2014-10-12 LAB — URINE CULTURE
COLONY COUNT: NO GROWTH
CULTURE: NO GROWTH

## 2014-10-13 ENCOUNTER — Encounter (HOSPITAL_COMMUNITY): Payer: Self-pay | Admitting: Oncology

## 2014-10-13 DIAGNOSIS — D689 Coagulation defect, unspecified: Secondary | ICD-10-CM

## 2014-10-13 DIAGNOSIS — Z7901 Long term (current) use of anticoagulants: Secondary | ICD-10-CM

## 2014-10-13 HISTORY — DX: Long term (current) use of anticoagulants: Z79.01

## 2014-10-13 HISTORY — DX: Coagulation defect, unspecified: D68.9

## 2014-10-13 NOTE — Progress Notes (Signed)
[name], MD [place] [address]  DVT (deep venous thrombosis), right - Plan: CBC with Differential, Comprehensive metabolic panel, D-dimer, quantitative, rivaroxaban (XARELTO) 20 MG TABS tablet, DISCONTINUED: rivaroxaban (XARELTO) 20 MG TABS tablet  Coumadin failure  Other fatigue - Plan: Iron and TIBC, Ferritin, CANCELED: Urinalysis, Routine w reflex microscopic, CANCELED: TSH  CURRENT THERAPY: Xarelto loading dose 15 mg BID x 21 days due to Coumadin failure with acute DVT (likely due to noncompliance with Coumadin)  INTERVAL HISTORY: RAZA BAYLESS 53 y.o. 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  I personally reviewed and went over laboratory results with the patient.  The results are noted within this dictation.  I personally reviewed and went over radiographic studies with the patient.  The results are noted within this dictation.  [place] of right LE report follows below.  On 10/05/2014, the patient called the [facility] complaining of severe pain, erythema, and edema of his right LE.  As a result, a R LE [place] was performed on 11/6 which demonstrated a right LE DVT that was partially occlusive, despite anticoagulation with vitamin K antagonist therapy.  Ghassan has a history of noncompliance and missed doses of Warfarin.  Given this finding, it was felt that Direct Xa inhibition with Xarelto would be more beneficial and hopefully improve compliance with anticoagulation.  Therefore, he was switched to Xarelto 15 mg BID  loading dose with transition to 20 mg daily in the near future after 21 days of loading dose therapy.  Chart is reviewed.  It is noted that the patient reported to the [facility] ED on 10/11/2014 for generalized fatigue.  He was noted to be febrile with acute bump in creatinine.  Jovanny was previously being treated with Cipro by urgent care health provider for a suspected UTI.  In the ED, he was febrile and work-up revealed early pyelonephritis and therefore the patient was treated in the ED with fluid, IV Rocephin, and switch of antibiotics to bactrim BID x 14 days.  He reports that he is feeling a little better from this infectious standpoint.  I spent time with [name] reviewing the risks, benefits, alternatives, and side effects of Xarelto therapy.  i discussed Idarcuzimab therapy for its reversal in an emergency situation.  I discussed healthy behavioral habits including gun safety, power tools safety, automobile safety, avoidance of contact sports, etc.   He reports that he is compliant with Xarelto medication.  He denies any blood in stool, black tarry stool, hematuria, gingival bleeding, hemoptysis, spontaneous bleeding.  He continues to have wound issues of his right lower extremity.  He notes that he got his first 21 day loading dose of Xarelto and started it on 10/05/2014.  However, he notes that he does not have Medicare part D and therefore he cannot afford future refills.  As a result, our financial counselor is working on that for the patient.  Hematologically, he denies any complaints otherwise, and ROS questioning is negative.   Past Medical History  Diagnosis Date  . DVT of leg (deep venous thrombosis)  rt. leg  . Inferior vena caval thrombosis     Chronic  . DVT (deep venous thrombosis) 06/15/2011  . H/O PE (pulmonary embolism) 06/15/2011  . 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 Coumadin failure 10/13/2014    Likely secondary to poor compliance    has  Post-phlebitic syndrome; DVT (deep venous thrombosis); H/O PE (pulmonary embolism); Congenital inferior vena cava interruption; Leg wound, left; IVC (inferior vena cava obstruction); and Coumadin failure on his problem list.     has No Known Allergies.  Mr. Vignola had no medications administered during this visit.  No past surgical history on file.  Denies any headaches, dizziness, double vision, fevers, chills, night sweats, nausea, vomiting, diarrhea, constipation, chest pain, heart palpitations, shortness of breath, blood in stool, black tarry stool, urinary pain, urinary burning, urinary frequency, hematuria.   PHYSICAL EXAMINATION  ECOG PERFORMANCE STATUS: 2 - Symptomatic, <50% confined to bed  Filed Vitals:   10/16/14 1000  BP: 114/76  Pulse: 87  Temp: 97.7 F (36.5 C)  Resp: 20    GENERAL:alert, no distress, well nourished, well developed, cooperative and obese SKIN: skin color, texture, turgor are normal, no rashes or significant lesions HEAD: Normocephalic, No masses, lesions, tenderness or abnormalities EYES: normal, PERRLA, EOMI, Conjunctiva are pink and non-injected EARS: External ears normal OROPHARYNX:lips, buccal mucosa, and tongue normal and mucous membranes are moist  NECK: supple, trachea midline LYMPH:  not examined BREAST:not examined LUNGS: not examined HEART: not examined ABDOMEN:obese BACK: Back symmetric, no curvature. EXTREMITIES:Both lower extremities are nicely wrapped and clean.  NEURO: alert & oriented x 3 with fluent speech, no focal motor/sensory deficits, limping gait due to right leg pain.   LABORATORY DATA: CBC    Component Value Date/Time   WBC 13.2* 10/10/2014 2322   RBC 4.63 10/10/2014 2322   RBC 5.46 12/10/2008 0600   HGB 14.2 10/10/2014 2322   HCT 38.9* 10/10/2014 2322   PLT 241 10/10/2014 2322   MCV 84.0 10/10/2014 2322   MCH 30.7 10/10/2014 2322   MCHC 36.5* 10/10/2014 2322   RDW 13.1 10/10/2014 2322   LYMPHSABS 1.6  10/10/2014 2322   MONOABS 1.1* 10/10/2014 2322   EOSABS 0.0 10/10/2014 2322   BASOSABS 0.1 10/10/2014 2322      Chemistry      Component Value Date/Time   NA 134* 10/11/2014 0157   K 3.2* 10/11/2014 0157   CL 94* 10/11/2014 0157   CO2 24 10/11/2014 0157   BUN 14 10/11/2014 0157   CREATININE 1.38* 10/11/2014 0157      Component Value Date/Time   CALCIUM 8.8 10/11/2014 0157   ALKPHOS 79 10/11/2014 0157   AST 35 10/11/2014 0157   ALT 31 10/11/2014 0157   BILITOT 1.7* 10/11/2014 0157     Lab Results  Component Value Date   DDIMER 0.32 07/03/2014     RADIOGRAPHIC STUDIES:  10/05/2014  CLINICAL DATA: Evaluate DVT. History of DVT.  EXAM: RIGHT LOWER EXTREMITY VENOUS DUPLEX ULTRASOUND  TECHNIQUE: Doppler venous assessment of the left lower extremity deep venous system was performed, including characterization of spectral flow, compressibility, and phasicity.  COMPARISON: None.  FINDINGS: There is thrombus throughout the deep venous structures of the right lower extremity this includes the common femoral, femoral, and popliteal veins as well as the posterior tibial vein and the greater saphenous vein (adjacent to the junction to the common femoral vein). The thrombus is partially occlusive. Eight channel of flow can be seen through the thrombus. It is  partially compressible. This has a subacute to chronic appearance. Acute DVT cannot be excluded by this study. The appearance is similar to that of the prior study. Doppler analysis demonstrates respiratory phasicity augmentation was deferred. Varicosities in the subcutaneous fat are also identified. Some of these are thrombosed.  There is an enlarged lymph node in the right groin with a short axis diameter of 1.6 cm.  Limited imaging in the left lower extremity demonstrates partially occlusive thrombus in the left common femoral vein  IMPRESSION: The study is positive for DVT. DVT is partially occlusive  and does have a chronic appearance. It is similar to findings noted on the prior study. Acute DVT cannot be excluded by this study.  Abnormally enlarged lymph node in the right groin is noted. Inflammatory or neoplastic etiology are not excluded.   Electronically Signed  By: Maryclare Bean M.D.  On: 10/05/2014 12:10    ASSESSMENT:  1. Vitamin K antagonist therapy failure with a symptomatic, partially occlusive DVT in Right LE on 10/05/2014.  Warfarin discontinued and patient started Xarelto therapy on 10/05/2014.  2. 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 Patient seen by Dr. Christoper Allegra at [facility] who found that he also has sickle cell trait which increases the patient's risk of DVTs. He did not have cardiolipin antibodies, nor beta-2 glycoprotein antibodies. It is believed by both, Dr. Mare Ferrari and [place], that he is not a good candidate for ongoing work and is a candidate for permanent disability  3. Continued tobacco use 4. Early pyelonephritis, on Bactrim  Patient Active Problem List   Diagnosis Date Noted  . 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 Leg wound, left 08/19/2011  . DVT (deep venous thrombosis) 06/15/2011  . H/O PE (pulmonary embolism) 06/15/2011  . 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 I personally reviewed and went over laboratory results with the patient.  The results are noted within this dictation. 2. I personally reviewed and went over radiographic studies with the patient.  The results are noted within this dictation.   3. Patient education regarding Xarelto, including safe behavioral habits. 4. Labs in 4 weeks: CBC diff, CMET, D-Dimer, iron/TIBC, ferritin 5. Continue  Xarelto 15 mg BID x 21 days 6. Rx for Xarelto 20 mg daily after 21 day loading dose. 7. Lendell Caprice consulted due to financial cost of Xarelto therapy 8. Return in 4 weeks for follow-up   THERAPY PLAN:  I have changed Doyne' anticoagulation to a Direct Xa inhibitor (Xarelto) from Vitamin K antagonist (Warfarin) for a failure of therapy with new right LE DVT on 10/05/2014.  All questions were answered. The patient knows to call the clinic with any problems, questions or concerns. We can certainly see the patient much sooner if necessary.  Patient and plan discussed with Dr. Farrel Gobble and he is in agreement with the aforementioned.   Tani Virgo 10/16/2014

## 2014-10-15 LAB — CULTURE, BLOOD (ROUTINE X 2)
Culture: NO GROWTH
Culture: NO GROWTH

## 2014-10-16 ENCOUNTER — Ambulatory Visit (HOSPITAL_COMMUNITY)
Admission: RE | Admit: 2014-10-16 | Discharge: 2014-10-16 | Disposition: A | Discharge status: Home / Self Care | Payer: Medicare Other | Source: Ambulatory Visit | Attending: Internal Medicine | Admitting: Internal Medicine

## 2014-10-16 ENCOUNTER — Encounter (HOSPITAL_COMMUNITY): Payer: Medicare Other | Attending: Oncology | Admitting: Oncology

## 2014-10-16 VITALS — BP 114/76 | HR 87 | Temp 97.7°F | Resp 20 | Wt 270.0 lb

## 2014-10-16 DIAGNOSIS — S81001D Unspecified open wound, right knee, subsequent encounter: Secondary | ICD-10-CM

## 2014-10-16 DIAGNOSIS — S81801D Unspecified open wound, right lower leg, subsequent encounter: Principal | ICD-10-CM

## 2014-10-16 DIAGNOSIS — I82401 Acute embolism and thrombosis of unspecified deep veins of right lower extremity: Secondary | ICD-10-CM | POA: Diagnosis not present

## 2014-10-16 DIAGNOSIS — D689 Coagulation defect, unspecified: Secondary | ICD-10-CM

## 2014-10-16 DIAGNOSIS — S91001D Unspecified open wound, right ankle, subsequent encounter: Principal | ICD-10-CM

## 2014-10-16 DIAGNOSIS — D688 Other specified coagulation defects: Secondary | ICD-10-CM

## 2014-10-16 DIAGNOSIS — R5383 Other fatigue: Secondary | ICD-10-CM | POA: Diagnosis not present

## 2014-10-16 DIAGNOSIS — I82409 Acute embolism and thrombosis of unspecified deep veins of unspecified lower extremity: Secondary | ICD-10-CM | POA: Diagnosis not present

## 2014-10-16 DIAGNOSIS — Z7901 Long term (current) use of anticoagulants: Secondary | ICD-10-CM

## 2014-10-16 MED ORDER — RIVAROXABAN 20 MG PO TABS: 20.0000 mg | ORAL_TABLET | Freq: Every day | ORAL | Status: DC

## 2014-10-16 NOTE — Therapy (Signed)
Wound Care Therapy  Patient Details  Name: Matthew Wise MRN: [id number] Date of Birth: 21-May-1961  Encounter Date: 10/16/2014      PT End of Session - 10/16/14 1149    Visit Number 10   Number of Visits 16   Date for PT Re-Evaluation 10/25/14   Authorization Type medicare (gcode updated on visit #10)   Authorization - Visit Number 10   Authorization - Number of Visits 20   PT Start Time 5621   PT Stop Time 1100   PT Time Calculation (min) 25 min   Activity Tolerance Patient tolerated treatment well   Behavior During Therapy [facility] for tasks assessed/performed      Past Medical History  Diagnosis Date  . DVT of leg (deep venous thrombosis)     rt. leg  . Inferior vena caval thrombosis     Chronic  . DVT (deep venous thrombosis) 06/15/2011  . H/O PE (pulmonary embolism) 06/15/2011  . 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  There were no vitals taken for this visit.  Visit Diagnosis:  Open wound of knee, leg (except thigh), and ankle, complicated, right, subsequent encounter         Problem List Patient Active Problem List   Diagnosis Date Noted  . 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 Leg wound, left 08/19/2011  . DVT (deep venous thrombosis) 06/15/2011  . H/O PE (pulmonary embolism) 06/15/2011  . 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           Wound Therapy - 10/16/14 1111    Subjective Pt states he has been sick.  States he has lost 19 lbs in the last 2 weeks.  States his Rt LE began hurting and went to ED and discovered He has a blood clot.  Then noticed a hardened area come up anterior LE and now has a protruding wound with moderate drainage.  All original wounds that were on is posterior LE's are now healed.   Wound perimeter is  tender and warm to touch.   Pain Assessment 0-10   Pain Score 5    Pain Type Other (Comment)  tenderness/soreness   Pain Location Leg   Pain Orientation Right;Anterior   Pain Descriptors / Indicators Discomfort;Sore;Tender   Pain Onset Gradual   Patients Stated Pain Goal 0   Pain Intervention(s) Medication (See eMAR)   Multiple Pain Sites No   Wound Properties Date First Assessed: 08/21/14 Time First Assessed: 1300 Wound Type: Other (Comment) , CVI  Location: Leg Location Orientation: Posterior;Right;Medial Wound Description (Comments): dry skin around periwound minimal drainage Present on Admission: Yes Final Assessment Date: 10/16/14 Final Assessment Time: 1100   Wound Properties Date First Assessed: 10/16/14 Time First Assessed: 1045 Wound Type: Other (Comment) Location: Leg Location Orientation: Right;Anterior Wound Description (Comments): Mid anterior Right Lower leg Present on Admission: No   Dressing Type Impregnated gauze (bismuth)  profore lite (profore was not available)   Dressing Changed New   Dressing Status Clean;Dry;Intact   Dressing Change Frequency Other (Comment)  2X week at therapy only   Site / Wound Assessment Pale;Yellow  boggy   % Wound base Red or Granulating 0%   % Wound base Yellow 100%   % Wound base Black 0%   Peri-wound Assessment Induration;Edema   Wound Length (  cm) 2.5 cm   Wound Width (cm) 2.5 cm   Wound Depth (cm) --  unknown   Drainage Amount Moderate   Drainage Description Serosanguineous   Non-staged Wound Description Not applicable   Treatment Cleansed;Debridement (Selective)  dressing   Selective Debridement - Location anterior Rt LE wound   Selective Debridement - Tools Used Forceps   Selective Debridement - Tissue Removed slough   Wound Therapy - Clinical Statement Posterior LE's are now healed.  Pt returns today with new wound to Rt anterior LE without known etiology, only that his Rt LE began hurting and increased in swelling/pain then a  wound formed.  Anticipate increasing depth of wound as wound with bogginess and increased edema/induration around perimeter of wound.  Cleansed wound and moisturized LE prior to bandaging with xeroform and profore system (used profore lite as we were out of regular profore).  Pt instructed to keep dressing in place until returns to clinic in 2 days.  Pt verbalized understanding.   Factors Delaying/Impairing Wound Healing Vascular compromise;Tobacco use   Hydrotherapy Plan Debridement;Dressing change;Patient/family education   Wound Therapy - Frequency Other (comment)  2X week   Wound Plan Continue with wound care to Rt LE.   Decrease Necrotic Tissue to 0   Decrease Necrotic Tissue - Progress Progressing toward goal   Increase Granulation Tissue to 100   Increase Granulation Tissue - Progress Progressing toward goal   Decrease Length/Width/Depth by (cm) 1X1 as STG, 0X0 as LTG   Decrease Length/Width/Depth - Progress Progressing toward goal   Improve Drainage Characteristics Min   Improve Drainage Characteristics - Progress Progressing toward goal   Patient/Family will be able to  demonstrate full time wear of juxtafit garments bilaterally   Patient/Family Instruction Goal - Progress Progressing toward goal   Additional Wound Therapy Goal Patient to report no pain or discomfort in Rt anterior LE   Additional Wound Therapy Goal - Progress Progressing toward goal          Teena Irani, PTA/CLT 10/16/2014, 11:57 AM

## 2014-10-16 NOTE — Patient Instructions (Signed)
[facility] Discharge Instructions  RECOMMENDATIONS MADE BY THE CONSULTANT AND ANY TEST RESULTS WILL BE SENT TO YOUR REFERRING PHYSICIAN.  Continue Xarelto 15 mg twice daily x 21 days. After 21 days, we will switch you to 20 mg daily. Compliance with this medication is very important.  Please call the clinic with any questions or concerns.  Please call the clinic or report to ED for any bleeding that does not stop Please call the the clinic or report to ED with any signs or symptoms of blood clot including, swelling of extremity, pain in extremity, swelling of extremity, chest pain, sudden onset of shortness of breath. Rx for Xarelto 20 mg tablets given today.  Thank you for choosing [facility] to provide your oncology and hematology care.  To afford each patient quality time with our providers, please arrive at least 15 minutes before your scheduled appointment time.  With your help, our goal is to use those 15 minutes to complete the necessary work-up to ensure our physicians have the information they need to help with your evaluation and healthcare recommendations.    Effective January 1st, 2014, we ask that you re-schedule your appointment with our physicians should you arrive 10 or more minutes late for your appointment.  We strive to give you quality time with our providers, and arriving late affects you and other patients whose appointments are after yours.    Again, thank you for choosing [facility].  Our hope is that these requests will decrease the amount of time that you wait before being seen by our physicians.       _____________________________________________________________  Should you have questions after your visit to [facility], please contact our office at [phone] between the hours of 8:30 a.m. and 5:00 p.m.  Voicemails left after 4:30 p.m. will not be returned until the following business day.  For  prescription refill requests, have your pharmacy contact our office with your prescription refill request.

## 2014-10-17 NOTE — Therapy (Signed)
Wound Care Evaluation  Patient Details  Name: Matthew Wise MRN: [id number] Date of Birth: [birth year]  Encounter Date: 10/16/2014      PT End of Session - 10/16/14 1149    Visit Number 10   Number of Visits 16   Date for PT Re-Evaluation 10/25/14   Authorization Type medicare (gcode updated on visit #10)   Authorization - Visit Number 10   Authorization - Number of Visits 20   PT Start Time 8182   PT Stop Time 1100   PT Time Calculation (min) 25 min   Activity Tolerance Patient tolerated treatment well   Behavior During Therapy [facility] for tasks assessed/performed      Past Medical History  Diagnosis Date  . DVT of leg (deep venous thrombosis)     rt. leg  . Inferior vena caval thrombosis     Chronic  . DVT (deep venous thrombosis) 06/15/2011  . H/O PE (pulmonary embolism) 06/15/2011  . 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  There were no vitals taken for this visit.  Visit Diagnosis:  Open wound of knee, leg (except thigh), and ankle, complicated, right, subsequent encounter          G-Codes - 11-11-14 0837    Functional Assessment Tool Used Clinical judgment: Wound   Functional Limitation Other PT primary   Other PT Primary Current Status (X9371) At least 40 percent but less than 60 percent impaired, limited or restricted   Other PT Primary Goal Status (I9678) At least 20 percent but less than 40 percent impaired, limited or restricted      Problem List Patient Active Problem List   Diagnosis Date Noted  . 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 Leg wound, left 08/19/2011  . DVT (deep venous thrombosis) 06/15/2011  . H/O PE (pulmonary embolism) 06/15/2011  . 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           Wound Therapy - 10/16/14  1111    Subjective Pt states he has been sick.  States he has lost 19 lbs in the last 2 weeks.  States his Rt LE began hurting and went to ED and discovered He has a blood clot.  Then noticed a hardened area come up anterior LE and now has a protruding wound with moderate drainage.  All original wounds that were on is posterior LE's are now healed.   Wound perimeter is tender and warm to touch.   Pain Assessment 0-10   Pain Score 5    Pain Type Other (Comment)  tenderness/soreness   Pain Location Leg   Pain Orientation Right;Anterior   Pain Descriptors / Indicators Discomfort;Sore;Tender   Pain Onset Gradual   Patients Stated Pain Goal 0   Pain Intervention(s) Medication (See eMAR)   Multiple Pain Sites No   Wound Properties Date First Assessed: 08/21/14 Time First Assessed: 1300 Wound Type: Other (Comment) , CVI  Location: Leg Location Orientation: Posterior;Right;Medial Wound Description (Comments): dry skin around periwound minimal drainage Present on Admission: Yes Final Assessment Date: 10/16/14 Final Assessment Time: 1100   Wound Properties Date First Assessed: 10/16/14 Time First Assessed: 1045 Wound Type: Other (Comment) Location: Leg Location Orientation: Right;Anterior Wound Description (Comments): Mid anterior Right Lower leg Present on Admission: No   Dressing Type Impregnated gauze (bismuth)  profore  lite (profore was not available)   Dressing Changed New   Dressing Status Clean;Dry;Intact   Dressing Change Frequency Other (Comment)  2X week at therapy only   Site / Wound Assessment Pale;Yellow  boggy   % Wound base Red or Granulating 0%   % Wound base Yellow 100%   % Wound base Black 0%   Peri-wound Assessment Induration;Edema   Wound Length (cm) 2.5 cm   Wound Width (cm) 2.5 cm   Wound Depth (cm) --  unknown   Drainage Amount Moderate   Drainage Description Serosanguineous   Non-staged Wound Description Not applicable   Treatment Cleansed;Debridement (Selective)   dressing   Selective Debridement - Location anterior Rt LE wound   Selective Debridement - Tools Used Forceps   Selective Debridement - Tissue Removed slough   Wound Therapy - Clinical Statement Posterior LE's are now healed.  Pt returns today with new wound to Rt anterior LE without known etiology, only that his Rt LE began hurting and increased in swelling/pain then a wound formed.  Anticipate increasing depth of wound as wound with bogginess and increased edema/induration around perimeter of wound.  Cleansed wound and moisturized LE prior to bandaging with xeroform and profore system (used profore lite as we were out of regular profore).  Pt instructed to keep dressing in place until returns to clinic in 2 days.  Pt verbalized understanding.   Factors Delaying/Impairing Wound Healing Vascular compromise;Tobacco use   Hydrotherapy Plan Debridement;Dressing change;Patient/family education   Wound Therapy - Frequency Other (comment)  2X week   Wound Plan Continue with wound care to Rt LE.   Decrease Necrotic Tissue to 0   Decrease Necrotic Tissue - Progress Progressing toward goal   Increase Granulation Tissue to 100   Increase Granulation Tissue - Progress Progressing toward goal   Decrease Length/Width/Depth by (cm) 1X1 as STG, 0X0 as LTG   Decrease Length/Width/Depth - Progress Progressing toward goal   Improve Drainage Characteristics Min   Improve Drainage Characteristics - Progress Progressing toward goal   Patient/Family will be able to  demonstrate full time wear of juxtafit garments bilaterally   Patient/Family Instruction Goal - Progress Progressing toward goal   Additional Wound Therapy Goal Patient to report no pain or discomfort in Rt anterior LE   Additional Wound Therapy Goal - Progress Progressing toward goal      Manly Nestle R 10/17/2014, 8:38 AM

## 2014-10-17 NOTE — Addendum Note (Signed)
Encounter addended by: Leia Alf, PT on: 10/17/2014  8:40 AM<BR>     Documentation filed: Fast Note

## 2014-10-18 ENCOUNTER — Ambulatory Visit (HOSPITAL_COMMUNITY)
Admission: RE | Admit: 2014-10-18 | Discharge: 2014-10-18 | Disposition: A | Discharge status: Home / Self Care | Payer: Medicare Other | Source: Ambulatory Visit | Attending: Internal Medicine | Admitting: Internal Medicine

## 2014-10-18 DIAGNOSIS — S91001D Unspecified open wound, right ankle, subsequent encounter: Principal | ICD-10-CM

## 2014-10-18 DIAGNOSIS — S81001D Unspecified open wound, right knee, subsequent encounter: Secondary | ICD-10-CM

## 2014-10-18 DIAGNOSIS — S81801D Unspecified open wound, right lower leg, subsequent encounter: Principal | ICD-10-CM

## 2014-10-18 DIAGNOSIS — S81802D Unspecified open wound, left lower leg, subsequent encounter: Secondary | ICD-10-CM

## 2014-10-18 DIAGNOSIS — I82409 Acute embolism and thrombosis of unspecified deep veins of unspecified lower extremity: Secondary | ICD-10-CM | POA: Diagnosis not present

## 2014-10-18 DIAGNOSIS — S91002D Unspecified open wound, left ankle, subsequent encounter: Secondary | ICD-10-CM

## 2014-10-18 DIAGNOSIS — S81002D Unspecified open wound, left knee, subsequent encounter: Secondary | ICD-10-CM

## 2014-10-18 NOTE — Therapy (Signed)
Wound Care Therapy  Patient Details  Name: Matthew Wise MRN: [id number] Date of Birth: 1961/07/10  Encounter Date: 10/18/2014      PT End of Session - 10/18/14 1208    Visit Number 11   Number of Visits 16   Date for PT Re-Evaluation 10/25/14   Authorization Type medicare (gcode updated on visit #10)   Authorization - Visit Number 10   Authorization - Number of Visits 20   PT Start Time 1025   PT Stop Time 1055   PT Time Calculation (min) 30 min   Activity Tolerance Patient tolerated treatment well   Behavior During Therapy [facility] for tasks assessed/performed      Past Medical History  Diagnosis Date  . DVT of leg (deep venous thrombosis)     rt. leg  . Inferior vena caval thrombosis     Chronic  . DVT (deep venous thrombosis) 06/15/2011  . H/O PE (pulmonary embolism) 06/15/2011  . 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  There were no vitals taken for this visit.  Visit Diagnosis:  Open wound of knee, leg (except thigh), and ankle, complicated, right, subsequent encounter  Open wound of knee, leg (except thigh), and ankle, complicated, left, subsequent encounter             G-Codes - 2014-11-13 0837    Functional Assessment Tool Used Clinical judgment: Wound   Functional Limitation Other PT primary   Other PT Primary Current Status (Z3007) At least 40 percent but less than 60 percent impaired, limited or restricted   Other PT Primary Goal Status (M2263) At least 20 percent but less than 40 percent impaired, limited or restricted      Problem List Patient Active Problem List   Diagnosis Date Noted  . 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 Leg wound, left 08/19/2011  . DVT (deep venous thrombosis) 06/15/2011  . H/O PE (pulmonary embolism) 06/15/2011  . Congenital inferior vena cava  interruption 06/15/2011  . 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          Wound Therapy - 10/18/14 1108    Subjective Pt reports his leg feels much better and noted bandage    Pain Assessment 0-10   Pain Score 0-No pain   Wound Properties Date First Assessed: 10/16/14 Time First Assessed: 1045 Wound Type: Other (Comment) Location: Leg Location Orientation: Right;Anterior Wound Description (Comments): Mid anterior Right Lower leg Present on Admission: No   Dressing Type Hydrogel;Gauze (Comment)  profore   Dressing Changed Changed   Dressing Status Clean;Dry;Intact   Dressing Change Frequency Other (Comment)  2X week at therapy only   Site / Wound Assessment Pale;Yellow  boggy   % Wound base Red or Granulating 5%   % Wound base Yellow 95%   % Wound base Black 0%   Peri-wound Assessment Induration;Edema   Drainage Amount Moderate   Drainage Description Serosanguineous   Non-staged Wound Description Not applicable   Treatment Cleansed;Debridement (Selective)   Selective Debridement - Location anterior Rt LE wound   Selective Debridement - Tools Used Forceps;Scissors   Selective Debridement - Tissue Removed slough   Wound Therapy - Clinical Statement Posterior LE's are now healed.  Much improved with overall reduction in edema perimeter of wound and wound no longer protruding above skin line. Able to remove some slough, however majoity is  slough consisitency.  No longer with pain also.  continued with profore and changed to hydrogel gauze to promote removal of slough.  Honey not used because continues to drain moderateley.  LE cleansed and moisturized prior to dressing change.    Factors Delaying/Impairing Wound Healing Vascular compromise;Tobacco use   Hydrotherapy Plan Debridement;Dressing change;Patient/family education   Wound Therapy - Frequency Other (comment)  2X week   Wound Plan Continue with wound care to Rt LE.   Decrease Necrotic Tissue to 0   Decrease Necrotic Tissue -  Progress Progressing toward goal   Increase Granulation Tissue to 100   Increase Granulation Tissue - Progress Progressing toward goal   Decrease Length/Width/Depth by (cm) 1X1 as STG, 0X0 as LTG   Decrease Length/Width/Depth - Progress Progressing toward goal   Improve Drainage Characteristics Min   Improve Drainage Characteristics - Progress Progressing toward goal   Patient/Family will be able to  demonstrate full time wear of juxtafit garments bilaterally   Patient/Family Instruction Goal - Progress Progressing toward goal   Additional Wound Therapy Goal Patient to report no pain or discomfort in Rt anterior LE   Additional Wound Therapy Goal - Progress Progressing toward goal          Teena Irani, PTA/CLT [phone] 10/18/2014, 12:10 PM

## 2014-10-22 ENCOUNTER — Ambulatory Visit (HOSPITAL_COMMUNITY): Payer: Medicare Other | Admitting: Physical Therapy

## 2014-10-24 ENCOUNTER — Ambulatory Visit (HOSPITAL_COMMUNITY)
Admission: RE | Admit: 2014-10-24 | Discharge: 2014-10-24 | Disposition: A | Discharge status: Home / Self Care | Payer: Medicare Other | Source: Ambulatory Visit | Attending: Internal Medicine | Admitting: Internal Medicine

## 2014-10-24 ENCOUNTER — Other Ambulatory Visit (HOSPITAL_COMMUNITY): Payer: Self-pay | Admitting: Oncology

## 2014-10-24 DIAGNOSIS — S81002D Unspecified open wound, left knee, subsequent encounter: Secondary | ICD-10-CM

## 2014-10-24 DIAGNOSIS — S91001D Unspecified open wound, right ankle, subsequent encounter: Principal | ICD-10-CM

## 2014-10-24 DIAGNOSIS — S81001D Unspecified open wound, right knee, subsequent encounter: Secondary | ICD-10-CM

## 2014-10-24 DIAGNOSIS — S81801D Unspecified open wound, right lower leg, subsequent encounter: Principal | ICD-10-CM

## 2014-10-24 DIAGNOSIS — S81802D Unspecified open wound, left lower leg, subsequent encounter: Secondary | ICD-10-CM

## 2014-10-24 DIAGNOSIS — I82409 Acute embolism and thrombosis of unspecified deep veins of unspecified lower extremity: Secondary | ICD-10-CM | POA: Diagnosis not present

## 2014-10-24 DIAGNOSIS — S91002D Unspecified open wound, left ankle, subsequent encounter: Secondary | ICD-10-CM

## 2014-10-24 NOTE — Therapy (Signed)
Wound Care Therapy  Patient Details  Name: Matthew Wise MRN: [id number] Date of Birth: November 16, 1961  Encounter Date: 10/24/2014      PT End of Session - 10/24/14 1108    Visit Number 12   Number of Visits 16   Date for PT Re-Evaluation 10/25/14   Authorization Type medicare (gcode updated on visit #10)   Authorization - Visit Number 12   Authorization - Number of Visits 20   PT Start Time 1020   PT Stop Time 1050   PT Time Calculation (min) 30 min   Activity Tolerance Patient tolerated treatment well   Behavior During Therapy [facility] for tasks assessed/performed      Past Medical History  Diagnosis Date  . DVT of leg (deep venous thrombosis)     rt. leg  . Inferior vena caval thrombosis     Chronic  . DVT (deep venous thrombosis) 06/15/2011  . H/O PE (pulmonary embolism) 06/15/2011  . 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  There were no vitals taken for this visit.  Visit Diagnosis:  No diagnosis found.     Problem List Patient Active Problem List   Diagnosis Date Noted  . 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 Leg wound, left 08/19/2011  . DVT (deep venous thrombosis) 06/15/2011  . H/O PE (pulmonary embolism) 06/15/2011  . 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  Pt comes today with profore removed, however with gauze over wound and juxtafit covering ankle to knee.  Pt states it is not as sore, no pain.   Pain Assessment 0-10   Pain Score 0-No pain   Wound Properties Date First Assessed: 10/16/14 Time First Assessed: 1045 Wound Type: Other (Comment) Location: Leg Location Orientation: Right;Anterior Wound Description (Comments): Mid  anterior Right Lower leg Present on Admission: No   Dressing Type Gauze (Comment)   profore and medihoney gel   Dressing Changed Changed   Dressing Status Clean;Dry;Intact   Dressing Change Frequency Other (Comment)  2X week at therapy only   Site / Wound Assessment Pale;Yellow  boggy   % Wound base Red or Granulating 5%   % Wound base Yellow 95%   % Wound base Black 0%   Peri-wound Assessment Induration;Edema   Wound Length (cm) 2.5 cm   Wound Width (cm) 2.5 cm   Wound Depth (cm) 0.5 cm   Undermining (cm) entire perimeter 0.5-1 cm entire perimeter   Drainage Amount Other (Comment)  unsure as with fresh bandage   Drainage Description Serosanguineous   Non-staged Wound Description Not applicable   Treatment Cleansed;Debridement (Selective)   Selective Debridement - Location anterior Rt LE wound   Selective Debridement - Tools Used Forceps;Scissors   Selective Debridement - Tissue Removed slough   Wound Therapy - Clinical Statement Wound continues to be majority slough and now with undermining perimeter of wound.  No change in size from last week. Continued with medihoney infused 2X2 packed into wound borders to promote approximation.    Factors Delaying/Impairing Wound Healing Vascular compromise;Tobacco use   Hydrotherapy Plan Debridement;Dressing change;Patient/family education   Wound Therapy - Frequency Other (comment)  2X week   Wound  Plan Continue with wound care to Rt LE.   Decrease Necrotic Tissue to 0   Increase Granulation Tissue to 100   Decrease Length/Width/Depth by (cm) 1X1 as STG, 0X0 as LTG   Improve Drainage Characteristics Min   Patient/Family will be able to  demonstrate full time wear of juxtafit garments bilaterally   Additional Wound Therapy Goal Patient to report no pain or discomfort in Rt anterior LE          Teena Irani, PTA/CLT [phone] 10/24/2014, 11:09 AM

## 2014-10-29 ENCOUNTER — Ambulatory Visit (HOSPITAL_COMMUNITY)
Admission: RE | Admit: 2014-10-29 | Discharge: 2014-10-29 | Disposition: A | Discharge status: Home / Self Care | Payer: Medicare Other | Source: Ambulatory Visit | Attending: Internal Medicine | Admitting: Internal Medicine

## 2014-10-29 DIAGNOSIS — S91001D Unspecified open wound, right ankle, subsequent encounter: Principal | ICD-10-CM

## 2014-10-29 DIAGNOSIS — S81802D Unspecified open wound, left lower leg, subsequent encounter: Secondary | ICD-10-CM

## 2014-10-29 DIAGNOSIS — S81001D Unspecified open wound, right knee, subsequent encounter: Secondary | ICD-10-CM

## 2014-10-29 DIAGNOSIS — S91002D Unspecified open wound, left ankle, subsequent encounter: Secondary | ICD-10-CM

## 2014-10-29 DIAGNOSIS — S81002D Unspecified open wound, left knee, subsequent encounter: Secondary | ICD-10-CM

## 2014-10-29 DIAGNOSIS — S81801D Unspecified open wound, right lower leg, subsequent encounter: Principal | ICD-10-CM

## 2014-10-29 DIAGNOSIS — I82409 Acute embolism and thrombosis of unspecified deep veins of unspecified lower extremity: Secondary | ICD-10-CM | POA: Diagnosis not present

## 2014-10-29 NOTE — Therapy (Signed)
Wound Care Therapy  Patient Details  Name: Matthew Wise MRN: [id number] Date of Birth: May 14, 1961  Encounter Date: 10/29/2014      PT End of Session - 10/29/14 0846    Visit Number 13   Number of Visits 16   Date for PT Re-Evaluation 11/02/14   Authorization Type medicare (gcode updated on visit #10)   Authorization - Visit Number 13   Authorization - Number of Visits 20   PT Start Time 0808   PT Stop Time 0832   PT Time Calculation (min) 24 min   Activity Tolerance Patient tolerated treatment well   Behavior During Therapy [facility] for tasks assessed/performed      Past Medical History  Diagnosis Date  . DVT of leg (deep venous thrombosis)     rt. leg  . Inferior vena caval thrombosis     Chronic  . DVT (deep venous thrombosis) 06/15/2011  . H/O PE (pulmonary embolism) 06/15/2011  . 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  There were no vitals taken for this visit.  Visit Diagnosis:  Open wound of knee, leg (except thigh), and ankle, complicated, right, subsequent encounter  Open wound of knee, leg (except thigh), and ankle, complicated, left, subsequent encounter         Wound Therapy - 10/29/14 0840    Subjective Pt states his leg is still tender around his wound.  continues to have blackened appearance with a little heat noted.  Pt reports he removed his bandages last nights.  comes today with gauze and juxtafit over.   Pain Assessment No/denies pain   Pain Score 0-No pain   Wound Properties Date First Assessed: 10/16/14 Time First Assessed: 1045 Wound Type: Other (Comment) Location: Leg Location Orientation: Right;Anterior Wound Description (Comments): Mid anterior Right Lower leg Present on Admission: No   Dressing Type Gauze (Comment)   profore and medihoney gel   Dressing Changed Changed   Dressing Status Clean;Dry;Intact   Dressing  Change Frequency Other (Comment)  2X week at therapy only   Site / Wound Assessment Pale;Yellow  boggy   % Wound base Red or Granulating 15%   % Wound base Yellow 85%   % Wound base Black 0%   Peri-wound Assessment Induration;Edema   Wound Length (cm) 3 cm   Wound Width (cm) 3 cm   Wound Depth (cm) 0.5 cm   Undermining (cm) 0.5 cm at 12:00, 4:00, 6:00 and 8:00   Drainage Amount Minimal  unsure as with fresh bandage   Drainage Description Serosanguineous   Non-staged Wound Description Not applicable   Treatment Cleansed;Debridement (Selective)   Selective Debridement - Location anterior Rt LE wound   Selective Debridement - Tools Used Forceps;Scissors   Selective Debridement - Tissue Removed slough   Wound Therapy - Clinical Statement Increased granulation today around the edges with entire base of wound remaining with adherent slough.  Remeaured again today with slight increase in size, due to debridment of tissue.  Some areas around wound approximated now.  Continued with medihoney gel packing into wound and undermined areas followed by profore compression system.    Factors Delaying/Impairing Wound Healing Vascular compromise;Tobacco use   Hydrotherapy Plan Debridement;Dressing change;Patient/family education   Wound Therapy - Frequency Other (comment)  2X week   Wound Plan Continue with wound care to Rt LE.   Decrease Necrotic Tissue to 0  Decrease Necrotic Tissue - Progress Progressing toward goal   Increase Granulation Tissue to 100   Increase Granulation Tissue - Progress Progressing toward goal   Decrease Length/Width/Depth by (cm) 1X1 as STG, 0X0 as LTG   Decrease Length/Width/Depth - Progress Progressing toward goal   Improve Drainage Characteristics Min   Improve Drainage Characteristics - Progress Met   Patient/Family will be able to  demonstrate full time wear of juxtafit garments bilaterally   Patient/Family Instruction Goal - Progress Progressing toward goal    Additional Wound Therapy Goal Patient to report no pain or discomfort in Rt anterior LE   Additional Wound Therapy Goal - Progress Progressing toward goal          Problem List Patient Active Problem List   Diagnosis Date Noted  . 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 Leg wound, left 08/19/2011  . DVT (deep venous thrombosis) 06/15/2011  . H/O PE (pulmonary embolism) 06/15/2011  . 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

## 2014-11-07 ENCOUNTER — Ambulatory Visit (HOSPITAL_COMMUNITY)
Admission: RE | Admit: 2014-11-07 | Discharge: 2014-11-07 | Disposition: A | Discharge status: Home / Self Care | Payer: Medicare Other | Source: Ambulatory Visit | Attending: Hematology and Oncology | Admitting: Hematology and Oncology

## 2014-11-07 DIAGNOSIS — S81801D Unspecified open wound, right lower leg, subsequent encounter: Secondary | ICD-10-CM

## 2014-11-07 DIAGNOSIS — S91001D Unspecified open wound, right ankle, subsequent encounter: Secondary | ICD-10-CM | POA: Diagnosis not present

## 2014-11-07 DIAGNOSIS — S81001D Unspecified open wound, right knee, subsequent encounter: Secondary | ICD-10-CM | POA: Diagnosis not present

## 2014-11-07 DIAGNOSIS — S81002D Unspecified open wound, left knee, subsequent encounter: Secondary | ICD-10-CM

## 2014-11-07 DIAGNOSIS — Z86711 Personal history of pulmonary embolism: Secondary | ICD-10-CM | POA: Diagnosis not present

## 2014-11-07 DIAGNOSIS — S91002D Unspecified open wound, left ankle, subsequent encounter: Secondary | ICD-10-CM

## 2014-11-07 DIAGNOSIS — S81802D Unspecified open wound, left lower leg, subsequent encounter: Secondary | ICD-10-CM

## 2014-11-07 DIAGNOSIS — Z86718 Personal history of other venous thrombosis and embolism: Secondary | ICD-10-CM | POA: Diagnosis not present

## 2014-11-07 DIAGNOSIS — R600 Localized edema: Secondary | ICD-10-CM | POA: Insufficient documentation

## 2014-11-08 NOTE — Therapy (Signed)
[facility] [address] [place], [place], [address] Phone: [phone]   Fax:  [phone]  Wound Care Therapy  Patient Details  Name: [name] MRN: [id number] Date of Birth: 10-14-1961  Encounter Date: 11/07/2014      PT End of Session - 11/07/14 0800    Visit Number 13   Number of Visits 16   Date for PT Re-Evaluation 10/25/14   Authorization Type medicare (gcode updated on visit #10)   Authorization - Visit Number 13   Authorization - Number of Visits 20   PT Start Time 9532   PT Stop Time 1600   PT Time Calculation (min) 30 min   Activity Tolerance Patient tolerated treatment well   Behavior During Therapy [facility] for tasks assessed/performed      Past Medical History  Diagnosis Date  . DVT of leg (deep venous thrombosis)     rt. leg  . Inferior vena caval thrombosis     Chronic  . DVT (deep venous thrombosis) 06/15/2011  . H/O PE (pulmonary embolism) 06/15/2011  . 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  There were no vitals taken for this visit.  Visit Diagnosis:  Open wound of knee, leg (except thigh), and ankle, complicated, right, subsequent encounter  Open wound of knee, leg (except thigh), and ankle, complicated, left, subsequent encounter         Wound Therapy - 11/08/14 0758 for 12/09 treatment   Wound Properties Date First Assessed: 10/16/14 Time First Assessed: 1045 Wound Type: Other (Comment) Location: Leg Location Orientation: Right;Anterior Wound Description (Comments): Mid anterior Right Lower leg Present on Admission: No   Selective Debridement - Location anterior Rt LE wound   Selective Debridement - Tools Used Forceps;Scissors   Selective Debridement - Tissue Removed slough   Wound Therapy - Clinical Statement Thick slough inside wound not easily debrided.  INcreased depth of  undermining perimeter of wound with induration and warmth around perimeter.  Continued with medihoney gel packed into edges to promote loosening of slough for debridement.  Pt encouraged to keep profore dressing in place.   Wound Plan Continue with wound care to Rt LE.   Decrease Necrotic Tissue to 0   Decrease Necrotic Tissue - Progress Progressing toward goal   Increase Granulation Tissue to 100   Increase Granulation Tissue - Progress Progressing toward goal   Decrease Length/Width/Depth by (cm) 1X1 as STG, 0X0 as LTG   Decrease Length/Width/Depth - Progress Progressing toward goal   Improve Drainage Characteristics Min   Improve Drainage Characteristics - Progress Progressing toward goal   Patient/Family will be able to  demonstrate full time wear of juxtafit garments bilaterally   Patient/Family Instruction Goal - Progress Met   Additional Wound Therapy Goal Patient to report no pain or discomfort in Rt anterior LE   Additional Wound Therapy Goal - Progress Met            Problem List Patient Active Problem List   Diagnosis Date Noted  . 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 Leg wound, left 08/19/2011  . DVT (deep venous thrombosis) 06/15/2011  . H/O PE (pulmonary embolism) 06/15/2011  . 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

## 2014-11-09 ENCOUNTER — Ambulatory Visit (HOSPITAL_COMMUNITY)
Admission: RE | Admit: 2014-11-09 | Discharge: 2014-11-09 | Disposition: A | Discharge status: Home / Self Care | Payer: Medicare Other | Source: Ambulatory Visit | Attending: Internal Medicine | Admitting: Internal Medicine

## 2014-11-09 DIAGNOSIS — S81001D Unspecified open wound, right knee, subsequent encounter: Secondary | ICD-10-CM

## 2014-11-09 DIAGNOSIS — S91001D Unspecified open wound, right ankle, subsequent encounter: Principal | ICD-10-CM

## 2014-11-09 DIAGNOSIS — Z86718 Personal history of other venous thrombosis and embolism: Secondary | ICD-10-CM | POA: Diagnosis not present

## 2014-11-09 DIAGNOSIS — Z86711 Personal history of pulmonary embolism: Secondary | ICD-10-CM | POA: Diagnosis not present

## 2014-11-09 DIAGNOSIS — S81801D Unspecified open wound, right lower leg, subsequent encounter: Principal | ICD-10-CM

## 2014-11-09 NOTE — Therapy (Signed)
[facility] [address] [place], [place], [address] Phone: [phone]   Fax:  [phone]  Physical Therapy Reassessment Patient Details  Name: Matthew Wise MRN: [id number] Date of Birth: 12/20/1960  Encounter Date: 11/09/2014      PT End of Session - 11/09/14 1711    Visit Number 14   Number of Visits 26   Date for PT Re-Evaluation 12/09/14   Authorization Type medicare (gcode updated on visit #10)   Authorization - Visit Number 14   Authorization - Number of Visits 20   PT Start Time 1600   PT Stop Time 1700   PT Time Calculation (min) 60 min   Activity Tolerance Patient tolerated treatment well   Behavior During Therapy [facility] for tasks assessed/performed      Past Medical History  Diagnosis Date  . DVT of leg (deep venous thrombosis)     rt. leg  . Inferior vena caval thrombosis     Chronic  . DVT (deep venous thrombosis) 06/15/2011  . H/O PE (pulmonary embolism) 06/15/2011  . 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  There were no vitals taken for this visit.  Visit Diagnosis:  Open wound of knee, leg (except thigh), and ankle, complicated, right, subsequent encounter        [facility] PT Assessment - 11/09/14 0001    Assessment   Medical Diagnosis nonhealihng wound    Onset Date 10/10/14   Prior Therapy debridement dressing chance   Precautions   Precautions None   Restrictions   Weight Bearing Restrictions No   Balance Screen   Has the patient fallen in the past 6 months No   Has the patient had a decrease in activity level because of a fear of falling?  No   Is the patient reluctant to leave their home because of a fear of falling?  No   Prior Function   Level of Independence Independent with basic ADLs   Cognition   Overall Cognitive Status Within Functional Limits for tasks assessed     measurement of Rt LE    Date 11/09/2014   Right  MCP 29.00  ankle 34  4cm 31.30  8cm 35.00  12 cm 41.70  16cm 47.00  20cm 52.60  24cm 55.50  28cm 57.40  32cm 53.80  36cm                           Sum of squares 20185.79  Total Volume 6425.3388            Wound Therapy - 11/09/14 1703    Subjective Pt states he has not been to the MD.  Admits to smoking.    Pain Assessment No/denies pain   Pain Score 0-No pain   Wound Properties Date First Assessed: 10/16/14 Time First Assessed: 1045 Wound Type: Other (Comment) Location: Leg Location Orientation: Right;Anterior Wound Description (Comments): Mid anterior Right Lower leg Present on Admission: No   Dressing Type Compression wrap;Silver hydrofiber  profore   Dressing Changed Changed   Dressing Status Old drainage   Dressing Change Frequency --  twice a week   Site / Wound Assessment Dusky;Yellow   % Wound base Red or Granulating 5%   % Wound base Yellow 95%   Peri-wound Assessment Induration   Wound Length (cm) 3.2 cm     Wound Width (cm) 3 cm   Wound Depth (cm) 1.8 cm   Undermining (cm) throughout wound   Drainage Amount Moderate   Drainage Description Purulent;Odor   Treatment Cleansed;Debridement (Selective)  manual lymph decongestion techniques completed ant/post.    Selective Debridement - Location wound bed   Selective Debridement - Tools Used Forceps;Scissors   Selective Debridement - Tissue Removed slough   Wound Therapy - Clinical Statement Pt urged to go to MD as MD has not seen this anterior wound.  This is the first time PT has seen wound as before we were treating a posterior wound.  Pt and therapist discussed the need to stop smoking    Factors Delaying/Impairing Wound Healing Altered sensation;Infection - systemic/local;Tobacco use;Vascular compromise   Hydrotherapy Plan Debridement;Dressing change;Patient/family education;Other (comment)  manual lymph decongestion.   Wound Plan Continue wound  care add in lymph decongestion techniaques. See pt 3 times a week for the next 4 weeks    Decrease Necrotic Tissue to 0   Decrease Necrotic Tissue - Progress Not met   Increase Granulation Tissue to 100   Increase Granulation Tissue - Progress Not met   Decrease Length/Width/Depth by (cm) wound to decrease by 1.5x1.5.1.5   Decrease Length/Width/Depth - Progress Not met   Improve Drainage Characteristics Min   Improve Drainage Characteristics - Progress Not met   Patient/Family will be able to  demonstrates full time wear of juxtafit B    Patient/Family Instruction Goal - Progress Met   Additional Wound Therapy Goal Pt to report no pain    Additional Wound Therapy Goal - Progress Met           Problem List Patient Active Problem List   Diagnosis Date Noted  . 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 Leg wound, left 08/19/2011  . DVT (deep venous thrombosis) 06/15/2011  . H/O PE (pulmonary embolism) 06/15/2011  . 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       

## 2014-11-11 NOTE — Progress Notes (Signed)
Matthew Wise., MD [place] [address]  DVT (deep venous thrombosis), right - Plan: CBC with Differential  Coumadin failure  IVC (inferior vena cava obstruction)  Congenital inferior vena cava interruption  H/O PE (pulmonary embolism)  Post-phlebitic syndrome  Chronic anticoagulation  CURRENT THERAPY: Xarelto 20 mg daily due to Coumadin failure with acute DVT (likely due to noncompliance with Coumadin)  INTERVAL HISTORY: Matthew Wise 53 y.o. 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  He was then started on Xarelto loading dose and transitioned to the maintenance dose after 21 days.   I personally reviewed and went over laboratory results with the patient.  The results are noted within this dictation.  I spent time with Matthew Wise reviewing the risks, benefits, alternatives, and side effects of Xarelto therapy. i discussed Idarcuzimab therapy for its reversal in an emergency situation. I discussed healthy behavioral habits including gun safety, power tools safety, automobile safety, avoidance of contact sports, etc.   We do long discussion today about his tobacco abuse. He reports that he smokes one pack per day. I spent some time going over the risks associated with smoking and the addiction to nicotine, which is a very strong addiction. We discussed ways of smoking cessation. I've encouraged him to demonstrate willpower regarding this by him decreasing his tobacco abuse before starting occasions. He is agreeable decreasing his tobacco  use to three quarters of a pack daily beginning 11/30/2014. From there, we'll talk about getting him down to a half a pack and setting a start date for that. I'll offer the patient prescription for NicoDerm CQ and/or Nicorette gum and he declined due to no insurance coverage. I recommended he talk to his pharmacist and see if his insurance would cover these medications. Otherwise, they are available to him over-the-counter that have encouraged him to utilize them to help bridge him while he decreases his tobacco abuse. We performed the arithmetic and he would save over $1500 year if he was able to quit smoking. This is a positive encouraging factor for the patient.  He shows me his right lower extremity anterior wound that is being addressed by the wound clinic. There is healthy granulation tissue noted. No indication of infection. I recommended continued follow-up with wound clinic.  Hematologically, the patient denies any complaints and review of systems questioning is negative.  Past Medical History  Diagnosis Date  . DVT of leg (deep venous thrombosis)     rt. leg  . Inferior vena caval thrombosis     Chronic  . DVT (deep venous thrombosis) 06/15/2011  . H/O PE (pulmonary embolism) 06/15/2011  . 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 Chronic anticoagulation 11/14/2014    has Post-phlebitic syndrome; DVT (deep venous thrombosis); H/O PE (pulmonary embolism); Congenital inferior vena cava interruption; Leg wound, left; IVC (inferior vena cava obstruction); Coumadin failure; and Chronic anticoagulation on his problem list.     has No Known Allergies.  Matthew Wise had no medications administered during this visit.  History  reviewed. No pertinent past surgical history.  Denies any headaches, dizziness, double vision, fevers, chills, night sweats, nausea, vomiting, diarrhea, constipation, chest pain, heart  palpitations, shortness of breath, blood in stool, black tarry stool, urinary pain, urinary burning, urinary frequency, hematuria.   PHYSICAL EXAMINATION  ECOG PERFORMANCE STATUS: 1 - Symptomatic but completely ambulatory  Filed Vitals:   11/14/14 1048  BP: 151/87  Pulse: 66  Temp: 98 F (36.7 C)  Resp: 18    GENERAL:alert, no distress, well nourished, well developed, comfortable, cooperative and smiling SKIN: skin color, texture, turgor are normal, no rashes or significant lesions HEAD: Normocephalic, No masses, lesions, tenderness or abnormalities, left anterior parietal nodule measuring 1 cm in size that is raised. EYES: normal, PERRLA, EOMI, Conjunctiva are pink and non-injected EARS: External ears normal OROPHARYNX:mucous membranes are moist  NECK: supple, thyroid normal size, non-tender, without nodularity, no stridor, non-tender, trachea midline LYMPH:  no palpable lymphadenopathy BREAST:not examined LUNGS: clear to auscultation  HEART: regular rate & rhythm, no murmurs and no gallops ABDOMEN:abdomen soft, non-tender and normal bowel sounds BACK: Back symmetric, no curvature. EXTREMITIES:less then 2 second capillary refill, no joint deformities, effusion, or inflammation, no clubbing, no cyanosis, positive findings:  Bilateral lower extremity edema with hyperpigmentation secondary to vascular disease. Right anterior lower leg wound measuring approximately 2.5 cm in size with healthy granulating tissue surrounding. No indication of infection.  NEURO: alert & oriented x 3 with fluent speech, no focal motor/sensory deficits, gait normal    LABORATORY DATA: CBC    Component Value Date/Time   WBC 4.2 11/14/2014 1030   RBC 4.74 11/14/2014 1030   RBC 5.46 12/10/2008 0600   HGB 14.3 11/14/2014 1030   HCT 41.6 11/14/2014 1030   PLT 218 11/14/2014 1030   MCV 87.8 11/14/2014 1030   MCH 30.2 11/14/2014 1030   MCHC 34.4 11/14/2014 1030   RDW 15.2 11/14/2014 1030   LYMPHSABS  1.5 11/14/2014 1030   MONOABS 0.4 11/14/2014 1030   EOSABS 0.1 11/14/2014 1030   BASOSABS 0.0 11/14/2014 1030      Chemistry      Component Value Date/Time   NA 134* 10/11/2014 0157   K 3.2* 10/11/2014 0157   CL 94* 10/11/2014 0157   CO2 24 10/11/2014 0157   BUN 14 10/11/2014 0157   CREATININE 1.38* 10/11/2014 0157      Component Value Date/Time   CALCIUM 8.8 10/11/2014 0157   ALKPHOS 79 10/11/2014 0157   AST 35 10/11/2014 0157   ALT 31 10/11/2014 0157   BILITOT 1.7* 10/11/2014 0157     Lab Results  Component Value Date   IRON 36* 12/10/2008   FERRITIN 208 12/10/2008      ASSESSMENT:  1. Vitamin K antagonist therapy failure with a symptomatic, partially occlusive DVT in Right LE on 10/05/2014. Warfarin discontinued and patient started Xarelto therapy on 10/05/2014.  2. 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 Patient seen by Dr. Christoper Allegra at [facility] who found that he also has sickle cell trait which increases the patient's risk of DVTs. He did not have cardiolipin antibodies, nor beta-2 glycoprotein antibodies. It is believed by both, Dr. Mare Ferrari and [place], that he is not a good candidate for ongoing work and is a candidate for permanent disability  3. Continued tobacco use, 1 ppd, planned to decrease to 3/4  ppd beginning on 11/30/2014. 4. Right LE anterior wound, 2.5 cm in size 5. Left anterior parietal nodule measuring 1 cm that is raised from skin   Patient Active Problem List   Diagnosis Date Noted  . Chronic anticoagulation 11/14/2014  . 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 Leg wound, left 08/19/2011  . DVT (deep venous thrombosis) 06/15/2011  . H/O PE (pulmonary embolism) 06/15/2011  . Congenital inferior vena cava interruption  06/15/2011  . 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 I personally reviewed and went over laboratory results with the patient.  The results are noted within this dictation. 2. Patient education regarding Xarelto and healthy behavior habits. 3. Labs in 3 months: CBC diff 4. Xarelto 20 mg daily.  Compliance encouraged. 5. Smoking cessation education provided.  He is going to cut back to 3/4 ppd on Nov 30, 2014. 6. Referral to dermatology for left anterior parietal head nodule measuring 1 cm in size and is raised.  7. Continue follow-up with wound clinic 8. Return in 3 months for follow-up   THERAPY PLAN:  Continue with Xarelto daily.  Compliance encouraged.  Smoking cessation education.  All questions were answered. The patient knows to call the clinic with any problems, questions or concerns. We can certainly see the patient much sooner if necessary.  Patient and plan discussed with Dr. Farrel Gobble and he is in agreement with the aforementioned.   KEFALAS,THOMAS 11/14/2014

## 2014-11-13 ENCOUNTER — Ambulatory Visit (HOSPITAL_COMMUNITY)
Admission: RE | Admit: 2014-11-13 | Discharge: 2014-11-13 | Disposition: A | Discharge status: Home / Self Care | Payer: Medicare Other | Source: Ambulatory Visit | Attending: Internal Medicine | Admitting: Internal Medicine

## 2014-11-13 DIAGNOSIS — S91001D Unspecified open wound, right ankle, subsequent encounter: Principal | ICD-10-CM

## 2014-11-13 DIAGNOSIS — Z86718 Personal history of other venous thrombosis and embolism: Secondary | ICD-10-CM | POA: Diagnosis not present

## 2014-11-13 DIAGNOSIS — S81001D Unspecified open wound, right knee, subsequent encounter: Secondary | ICD-10-CM | POA: Diagnosis not present

## 2014-11-13 DIAGNOSIS — Z86711 Personal history of pulmonary embolism: Secondary | ICD-10-CM | POA: Diagnosis not present

## 2014-11-13 DIAGNOSIS — S81801D Unspecified open wound, right lower leg, subsequent encounter: Principal | ICD-10-CM

## 2014-11-13 NOTE — Therapy (Signed)
[facility] ([facility]) [address] [place], [place], [address] Phone: [phone]   Fax:  [phone]  Wound Care Therapy  Patient Details  Name: Matthew Wise MRN: [id number] Date of Birth: [birth year]  Encounter Date: 11/13/2014      PT End of Session - 11/13/14 1044    Visit Number 15   Number of Visits 26   Date for PT Re-Evaluation 12/09/14   Authorization Type medicare (gcode updated on visit #10)   Authorization - Visit Number 15   Authorization - Number of Visits 20   PT Start Time 0935   PT Stop Time 1020   PT Time Calculation (min) 45 min      Past Medical History  Diagnosis Date  . DVT of leg (deep venous thrombosis)     rt. leg  . Inferior vena caval thrombosis     Chronic  . DVT (deep venous thrombosis) 06/15/2011  . H/O PE (pulmonary embolism) 06/15/2011  . 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  There were no vitals taken for this visit.  Visit Diagnosis:  Open wound of knee, leg (except thigh), and ankle, complicated, right, subsequent encounter         Wound Therapy - 11/13/14 1038    Subjective Pt states he has a MD visit this week but he is unsure of the time.  Pt comes to departiment with Juxtalite on; states he feels like swelling went down slightly.   Pain Assessment No/denies pain   Pain Score 0-No pain   Wound Properties Date First Assessed: 10/16/14 Time First Assessed: 1045 Wound Type: Other (Comment) Location: Leg Location Orientation: Right;Anterior Wound Description (Comments): Mid anterior Right Lower leg Present on Admission: No   Dressing Type Compression wrap;Silver hydrofiber  profore   Dressing Status Old drainage   Dressing Change Frequency --  twice a week   Site / Wound Assessment Dusky;Yellow   % Wound base Red or Granulating --  2%   % Wound base Yellow --  98%    Peri-wound Assessment Induration   Undermining (cm) throughout wound    Drainage Amount Moderate   Drainage Description Purulent   Treatment Cleansed;Debridement (Selective);Other (Comment)  manual decongestive therapy    Selective Debridement - Location wound bed   Selective Debridement - Tools Used Forceps;Scissors   Selective Debridement - Tissue Removed slough   Wound Therapy - Clinical Statement Wound has decreased induration but wound does not look any better.  Pt has MD appointment this week.  Will continue with decongestive techniques as well as debridement to promote a healthy healing environment    Factors Delaying/Impairing Wound Healing Altered sensation;Infection - systemic/local;Tobacco use;Vascular compromise   Hydrotherapy Plan Debridement;Dressing change;Patient/family education;Other (comment)  manual lymph decongestion.   Wound Plan Continue wound care add in lymph decongestion techniaques.    Decrease Necrotic Tissue to 0   Increase Granulation Tissue to 100   Increase Granulation Tissue - Progress Not met   Decrease Length/Width/Depth by (cm) wound to decrease by 1.5x1.5.1.5   Decrease Length/Width/Depth - Progress Not met   Improve Drainage Characteristics Min   Improve Drainage Characteristics - Progress Not met   Patient/Family will be able to  demonstrates full time wear of juxtafit B    Patient/Family Instruction Goal - Progress Met   Additional Wound Therapy Goal Pt to report no pain  Additional Wound Therapy Goal - Progress Met          Problem List Patient Active Problem List   Diagnosis Date Noted  . 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 Leg wound, left 08/19/2011  . DVT (deep venous thrombosis) 06/15/2011  . H/O PE (pulmonary embolism) 06/15/2011  . 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  11/13/2014, 10:46 AM   Azucena Freed  PT/CLT [phone]

## 2014-11-14 ENCOUNTER — Encounter (HOSPITAL_COMMUNITY): Payer: Self-pay | Admitting: Lab

## 2014-11-14 ENCOUNTER — Encounter (HOSPITAL_COMMUNITY): Payer: Medicare Other | Attending: Oncology | Admitting: Oncology

## 2014-11-14 ENCOUNTER — Encounter (HOSPITAL_COMMUNITY): Payer: Self-pay | Admitting: Oncology

## 2014-11-14 ENCOUNTER — Encounter (HOSPITAL_BASED_OUTPATIENT_CLINIC_OR_DEPARTMENT_OTHER): Payer: Medicare Other

## 2014-11-14 VITALS — BP 151/87 | HR 66 | Temp 98.0°F | Resp 18 | Wt 282.7 lb

## 2014-11-14 DIAGNOSIS — I871 Compression of vein: Secondary | ICD-10-CM

## 2014-11-14 DIAGNOSIS — Q269 Congenital malformation of great vein, unspecified: Secondary | ICD-10-CM

## 2014-11-14 DIAGNOSIS — I82401 Acute embolism and thrombosis of unspecified deep veins of right lower extremity: Secondary | ICD-10-CM | POA: Diagnosis not present

## 2014-11-14 DIAGNOSIS — Z7901 Long term (current) use of anticoagulants: Secondary | ICD-10-CM | POA: Insufficient documentation

## 2014-11-14 DIAGNOSIS — Z86711 Personal history of pulmonary embolism: Secondary | ICD-10-CM

## 2014-11-14 DIAGNOSIS — I2699 Other pulmonary embolism without acute cor pulmonale: Secondary | ICD-10-CM

## 2014-11-14 DIAGNOSIS — D688 Other specified coagulation defects: Secondary | ICD-10-CM | POA: Insufficient documentation

## 2014-11-14 DIAGNOSIS — Z720 Tobacco use: Secondary | ICD-10-CM | POA: Diagnosis not present

## 2014-11-14 DIAGNOSIS — I87009 Postthrombotic syndrome without complications of unspecified extremity: Secondary | ICD-10-CM

## 2014-11-14 DIAGNOSIS — R5383 Other fatigue: Secondary | ICD-10-CM

## 2014-11-14 DIAGNOSIS — D689 Coagulation defect, unspecified: Secondary | ICD-10-CM

## 2014-11-14 DIAGNOSIS — I82409 Acute embolism and thrombosis of unspecified deep veins of unspecified lower extremity: Secondary | ICD-10-CM

## 2014-11-14 HISTORY — DX: Long term (current) use of anticoagulants: Z79.01

## 2014-11-14 LAB — CBC WITH DIFFERENTIAL/PLATELET
BASOS ABS: 0 10*3/uL (ref 0.0–0.1)
BASOS PCT: 1 % (ref 0–1)
EOS ABS: 0.1 10*3/uL (ref 0.0–0.7)
EOS PCT: 1 % (ref 0–5)
HEMATOCRIT: 41.6 % (ref 39.0–52.0)
Hemoglobin: 14.3 g/dL (ref 13.0–17.0)
Lymphocytes Relative: 37 % (ref 12–46)
Lymphs Abs: 1.5 10*3/uL (ref 0.7–4.0)
MCH: 30.2 pg (ref 26.0–34.0)
MCHC: 34.4 g/dL (ref 30.0–36.0)
MCV: 87.8 fL (ref 78.0–100.0)
MONO ABS: 0.4 10*3/uL (ref 0.1–1.0)
Monocytes Relative: 9 % (ref 3–12)
NEUTROS ABS: 2.2 10*3/uL (ref 1.7–7.7)
Neutrophils Relative %: 52 % (ref 43–77)
Platelets: 218 10*3/uL (ref 150–400)
RBC: 4.74 MIL/uL (ref 4.22–5.81)
RDW: 15.2 % (ref 11.5–15.5)
WBC: 4.2 10*3/uL (ref 4.0–10.5)

## 2014-11-14 LAB — IRON AND TIBC
IRON: 63 ug/dL (ref 42–135)
SATURATION RATIOS: 23 % (ref 20–55)
TIBC: 275 ug/dL (ref 215–435)
UIBC: 212 ug/dL (ref 125–400)

## 2014-11-14 LAB — COMPREHENSIVE METABOLIC PANEL
ALT: 18 U/L (ref 0–53)
ANION GAP: 11 (ref 5–15)
AST: 22 U/L (ref 0–37)
Albumin: 3.5 g/dL (ref 3.5–5.2)
Alkaline Phosphatase: 107 U/L (ref 39–117)
BUN: 8 mg/dL (ref 6–23)
CALCIUM: 9 mg/dL (ref 8.4–10.5)
CO2: 26 mEq/L (ref 19–32)
Chloride: 103 mEq/L (ref 96–112)
Creatinine, Ser: 1.14 mg/dL (ref 0.50–1.35)
GFR calc Af Amer: 84 mL/min — ABNORMAL LOW (ref >90)
GFR calc non Af Amer: 73 mL/min — ABNORMAL LOW (ref >90)
Glucose, Bld: 109 mg/dL — ABNORMAL HIGH (ref 70–99)
Potassium: 3.7 mEq/L (ref 3.7–5.3)
Sodium: 140 mEq/L (ref 137–147)
TOTAL PROTEIN: 8 g/dL (ref 6.0–8.3)
Total Bilirubin: 0.5 mg/dL (ref 0.3–1.2)

## 2014-11-14 LAB — D-DIMER, QUANTITATIVE (NOT AT ARMC): D DIMER QUANT: 1.37 ug{FEU}/mL — AB (ref 0.00–0.48)

## 2014-11-14 LAB — FERRITIN: Ferritin: 313 ng/mL (ref 22–322)

## 2014-11-14 MED ORDER — RIVAROXABAN 20 MG PO TABS: 20.0000 mg | ORAL_TABLET | Freq: Every day | ORAL | Status: DC

## 2014-11-14 MED ORDER — OXYCODONE HCL 5 MG PO CAPS: 5.0000 mg | ORAL_CAPSULE | ORAL | Status: DC | PRN

## 2014-11-14 NOTE — Progress Notes (Signed)
Referral sent to Dr Nevada Crane dermatology.  Faxed on 12/16

## 2014-11-14 NOTE — Addendum Note (Signed)
Addended by: Baird Cancer on: 11/14/2014 03:42 PM   Modules accepted: Orders

## 2014-11-14 NOTE — Progress Notes (Signed)
LABS FOR FERR,IRON/TIBC,DDIM,CBCD,CMP

## 2014-11-14 NOTE — Patient Instructions (Signed)
[facility] Discharge Instructions  RECOMMENDATIONS MADE BY THE CONSULTANT AND ANY TEST RESULTS WILL BE SENT TO YOUR REFERRING PHYSICIAN.  Labs today are great! We will repeat labs in 3 months. Return in 3 months for follow-up. Smoking cessation discussed.  ON Nov 30, 2014, YOU AGREED TO CUT YOUR CIGARETTE SMOKING DOWN TO 3/4  PER DAY.  DO IT!  Merry Christmas and Happy New Year.   CBC    Component Value Date/Time   WBC 4.2 11/14/2014 1030   RBC 4.74 11/14/2014 1030   RBC 5.46 12/10/2008 0600   HGB 14.3 11/14/2014 1030   HCT 41.6 11/14/2014 1030   PLT 218 11/14/2014 1030   MCV 87.8 11/14/2014 1030   MCH 30.2 11/14/2014 1030   MCHC 34.4 11/14/2014 1030   RDW 15.2 11/14/2014 1030   LYMPHSABS 1.5 11/14/2014 1030   MONOABS 0.4 11/14/2014 1030   EOSABS 0.1 11/14/2014 1030   BASOSABS 0.0 11/14/2014 1030      Thank you for choosing [facility] to provide your oncology and hematology care.  To afford each patient quality time with our providers, please arrive at least 15 minutes before your scheduled appointment time.  With your help, our goal is to use those 15 minutes to complete the necessary work-up to ensure our physicians have the information they need to help with your evaluation and healthcare recommendations.    Effective January 1st, 2014, we ask that you re-schedule your appointment with our physicians should you arrive 10 or more minutes late for your appointment.  We strive to give you quality time with our providers, and arriving late affects you and other patients whose appointments are after yours.    Again, thank you for choosing [facility].  Our hope is that these requests will decrease the amount of time that you wait before being seen by our physicians.       _____________________________________________________________  Should you have questions after your visit to [facility], please contact our office  at [phone] between the hours of 8:30 a.m. and 5:00 p.m.  Voicemails left after 4:30 p.m. will not be returned until the following business day.  For prescription refill requests, have your pharmacy contact our office with your prescription refill request.

## 2014-11-15 ENCOUNTER — Ambulatory Visit (HOSPITAL_COMMUNITY)
Admission: RE | Admit: 2014-11-15 | Discharge: 2014-11-15 | Disposition: A | Discharge status: Home / Self Care | Payer: Medicare Other | Source: Ambulatory Visit | Attending: Internal Medicine | Admitting: Internal Medicine

## 2014-11-15 DIAGNOSIS — S81002D Unspecified open wound, left knee, subsequent encounter: Secondary | ICD-10-CM

## 2014-11-15 DIAGNOSIS — S91002D Unspecified open wound, left ankle, subsequent encounter: Secondary | ICD-10-CM

## 2014-11-15 DIAGNOSIS — S81801D Unspecified open wound, right lower leg, subsequent encounter: Principal | ICD-10-CM

## 2014-11-15 DIAGNOSIS — Z86718 Personal history of other venous thrombosis and embolism: Secondary | ICD-10-CM | POA: Diagnosis not present

## 2014-11-15 DIAGNOSIS — S81001D Unspecified open wound, right knee, subsequent encounter: Secondary | ICD-10-CM

## 2014-11-15 DIAGNOSIS — Z86711 Personal history of pulmonary embolism: Secondary | ICD-10-CM | POA: Diagnosis not present

## 2014-11-15 DIAGNOSIS — S91001D Unspecified open wound, right ankle, subsequent encounter: Secondary | ICD-10-CM | POA: Diagnosis not present

## 2014-11-15 DIAGNOSIS — S81802D Unspecified open wound, left lower leg, subsequent encounter: Secondary | ICD-10-CM

## 2014-11-15 NOTE — Therapy (Signed)
[facility] [address] [place], [place], [address] Phone: [phone]   Fax:  [phone]  Wound Care Therapy  Patient Details  Name: Matthew Wise MRN: [id number] Date of Birth: [birth year]  Encounter Date: 11/15/2014      PT End of Session - 11/15/14 1033    Visit Number 16   Number of Visits 26   Date for PT Re-Evaluation 12/09/14   Authorization Type medicare (gcode updated on visit #10)   Authorization - Visit Number 16   Authorization - Number of Visits 20   PT Start Time 0935   PT Stop Time 1015   PT Time Calculation (min) 40 min      Past Medical History  Diagnosis Date  . DVT of leg (deep venous thrombosis)     rt. leg  . Inferior vena caval thrombosis     Chronic  . DVT (deep venous thrombosis) 06/15/2011  . H/O PE (pulmonary embolism) 06/15/2011  . 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 Chronic anticoagulation 11/14/2014    No past surgical history on file.  There were no vitals taken for this visit.  Visit Diagnosis:  No diagnosis found.         Wound Therapy - 11/15/14 1024    Subjective Pt states he has a MD visit this week but he is unsure of the time.  Pt comes to departiment with Juxtalite on; states he feels like swelling went down slightly.   Pain Score 0-No pain   Wound Properties Date First Assessed: 10/16/14 Time First Assessed: 1045 Wound Type: Other (Comment) Location: Leg Location Orientation: Right;Anterior Wound Description (Comments): Mid anterior Right Lower leg Present on Admission: No   Dressing Type Compression wrap;Silver hydrofiber  profore   Dressing Changed Changed   Dressing Status Old drainage   Dressing Change Frequency --  twice a week   Site / Wound Assessment Dusky;Yellow   % Wound base Red or Granulating 5%  2%   % Wound base Yellow 95%  98%   Peri-wound Assessment Induration   Wound  Length (cm) 2.8 cm   Wound Width (cm) 2.5 cm   Wound Depth (cm) 1.5 cm   Undermining (cm) mostly at 12:00 around to 6:00 0.5-0.75cm   Drainage Amount Moderate   Drainage Description Purulent;Serosanguineous   Treatment Cleansed;Debridement (Selective);Other (Comment)  Manual lymph drainage for Rt LE   Selective Debridement - Location wound bed   Selective Debridement - Tools Used Forceps;Scissors;Other (comment)  qtip undermining   Selective Debridement - Tissue Removed slough   Wound Therapy - Clinical Statement Wound with overall reduction in size and depth of undermining as compared to measurements taken last week.  Noted slight odor when debrideing at 3:00-4:00 position.  Slough remains adherent in center of wound, however loosening around borders.  Cleansed wound well using Qtip in undermined area.  Pt may benefit from pulsed lavage to deeper cleanse underneath.  Manual lymph drainage completed for Rt LE followed by cleansing and moisturing Rt LE prior to dressing with profore.     Factors Delaying/Impairing Wound Healing Altered sensation;Infection - systemic/local;Tobacco use;Vascular compromise   Hydrotherapy Plan Debridement;Dressing change;Patient/family education;Other (comment)  manual lymph decongestion.   Wound Plan Continue wound care and manual lymph drainage for Rt LE. Discuss possiblity of adding pulse lavage to deeper cleanse wound with evaluating therapist.   Decrease Necrotic Tissue to 0   Decrease  Necrotic Tissue - Progress Progressing toward goal   Increase Granulation Tissue to 100   Increase Granulation Tissue - Progress Progressing toward goal   Decrease Length/Width/Depth by (cm) wound to decrease by 1.5x1.5.1.5   Decrease Length/Width/Depth - Progress Progressing toward goal   Improve Drainage Characteristics Min   Improve Drainage Characteristics - Progress Progressing toward goal   Patient/Family will be able to  demonstrates full time wear of juxtafit B     Patient/Family Instruction Goal - Progress Met   Additional Wound Therapy Goal Pt to report no pain    Additional Wound Therapy Goal - Progress Met        Problem List Patient Active Problem List   Diagnosis Date Noted  . Chronic anticoagulation 11/14/2014  . 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 Leg wound, left 08/19/2011  . DVT (deep venous thrombosis) 06/15/2011  . H/O PE (pulmonary embolism) 06/15/2011  . 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    Teena Irani, PTA/CLT [phone] 11/15/2014, 10:36 AM

## 2014-11-19 ENCOUNTER — Ambulatory Visit (HOSPITAL_COMMUNITY)
Admission: RE | Admit: 2014-11-19 | Discharge: 2014-11-19 | Disposition: A | Discharge status: Home / Self Care | Payer: Medicare Other | Source: Ambulatory Visit | Attending: Internal Medicine | Admitting: Internal Medicine

## 2014-11-19 DIAGNOSIS — S81001D Unspecified open wound, right knee, subsequent encounter: Secondary | ICD-10-CM | POA: Diagnosis not present

## 2014-11-19 DIAGNOSIS — S81801D Unspecified open wound, right lower leg, subsequent encounter: Principal | ICD-10-CM

## 2014-11-19 DIAGNOSIS — S91002D Unspecified open wound, left ankle, subsequent encounter: Secondary | ICD-10-CM

## 2014-11-19 DIAGNOSIS — Z86718 Personal history of other venous thrombosis and embolism: Secondary | ICD-10-CM | POA: Diagnosis not present

## 2014-11-19 DIAGNOSIS — S91001D Unspecified open wound, right ankle, subsequent encounter: Principal | ICD-10-CM

## 2014-11-19 DIAGNOSIS — Z86711 Personal history of pulmonary embolism: Secondary | ICD-10-CM | POA: Diagnosis not present

## 2014-11-19 DIAGNOSIS — S81802D Unspecified open wound, left lower leg, subsequent encounter: Secondary | ICD-10-CM

## 2014-11-19 DIAGNOSIS — S81002D Unspecified open wound, left knee, subsequent encounter: Secondary | ICD-10-CM

## 2014-11-19 NOTE — Therapy (Signed)
[facility] [place], [place], [address] Phone: [phone]   Fax:  [phone]  Wound Care Therapy  Patient Details  Name: Matthew Wise MRN: [id number] Date of Birth: December 25, 1960  Encounter Date: 11/19/2014    Past Medical History  Diagnosis Date  . DVT of leg (deep venous thrombosis)     rt. leg  . Inferior vena caval thrombosis     Chronic  . DVT (deep venous thrombosis) 06/15/2011  . H/O PE (pulmonary embolism) 06/15/2011  . 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 Chronic anticoagulation 11/14/2014    No past surgical history on file.  There were no vitals taken for this visit.  Visit Diagnosis:  Open wound of knee, leg (except thigh), and ankle, complicated, right, subsequent encounter  Open wound of knee, leg (except thigh), and ankle, complicated, left, subsequent encounter          PT states no pain.  Pt to MD who stated to continue wound care.  Pt states profore bandaging continues to slide down his leg.        Wound Therapy - 11/19/14 1321    Wound Properties Date First Assessed: 10/16/14 Time First Assessed: 1045 Wound Type: Other (Comment) Location: Leg Location Orientation: Right;Anterior Wound Description (Comments): Mid anterior Right Lower leg Present on Admission: No   Selective Debridement - Location wound bed   Selective Debridement - Tools Used Forceps;Scissors;Other (comment)  qtip undermining   Selective Debridement - Tissue Removed Slough.  Pt then received manual techniques for lymphedma including supraclavicular, deep and superficial abdominal, routing fluid using inguinal/axillary anastomois both anterior and posteriorly.    Wound Therapy - Clinical Statement Wound has decreased induration but wound does look slightly better.  Pt had MD appointment states MD stated to continue therapy no  antibiotic neededWill continue with decongestive techniques as well as debridement to promote a healthy healing environment    Factors Delaying/Impairing Wound Healing Altered sensation;Infection - systemic/local;Tobacco use;Vascular compromise   Hydrotherapy Plan Debridement;Dressing change;Patient/family education;Other (comment)  manual lymph decongestion.   Wound Plan begin pulse lavage:  continue with manual ; pt may benefit from use of santyl to decrease necrotic tissue.    Decrease Necrotic Tissue to 0   Decrease Necrotic Tissue - Progress Not progressing   Increase Granulation Tissue to --  100   Increase Granulation Tissue - Progress Mot progressing        Problem List Patient Active Problem List   Diagnosis Date Noted  . Chronic anticoagulation 11/14/2014  . 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 Leg wound, left 08/19/2011  . DVT (deep venous thrombosis) 06/15/2011  . H/O PE (pulmonary embolism) 06/15/2011  . 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    Maryann Mccall,CINDYPT/CLT 11/19/2014, 1:30 PM  [facility] [address] [place], [place], [address] Phone: [phone]   Fax:  [phone]

## 2014-11-21 ENCOUNTER — Ambulatory Visit (HOSPITAL_COMMUNITY): Payer: Medicare Other | Admitting: Physical Therapy

## 2014-11-22 ENCOUNTER — Ambulatory Visit (HOSPITAL_COMMUNITY)
Admission: RE | Admit: 2014-11-22 | Discharge: 2014-11-22 | Disposition: A | Discharge status: Home / Self Care | Payer: Medicare Other | Source: Ambulatory Visit | Attending: Internal Medicine | Admitting: Internal Medicine

## 2014-11-22 DIAGNOSIS — S91001D Unspecified open wound, right ankle, subsequent encounter: Secondary | ICD-10-CM | POA: Diagnosis not present

## 2014-11-22 DIAGNOSIS — Z86711 Personal history of pulmonary embolism: Secondary | ICD-10-CM | POA: Diagnosis not present

## 2014-11-22 DIAGNOSIS — S81801D Unspecified open wound, right lower leg, subsequent encounter: Principal | ICD-10-CM

## 2014-11-22 DIAGNOSIS — S91002D Unspecified open wound, left ankle, subsequent encounter: Secondary | ICD-10-CM

## 2014-11-22 DIAGNOSIS — S81802D Unspecified open wound, left lower leg, subsequent encounter: Secondary | ICD-10-CM

## 2014-11-22 DIAGNOSIS — S81001D Unspecified open wound, right knee, subsequent encounter: Secondary | ICD-10-CM

## 2014-11-22 DIAGNOSIS — S81002D Unspecified open wound, left knee, subsequent encounter: Secondary | ICD-10-CM

## 2014-11-22 DIAGNOSIS — Z86718 Personal history of other venous thrombosis and embolism: Secondary | ICD-10-CM | POA: Diagnosis not present

## 2014-11-22 NOTE — Therapy (Signed)
[place] [place], [place], [address] Phone: [phone]   Fax:  [phone]  Wound Care Therapy  Patient Details  Name: Matthew Wise MRN: [id number] Date of Birth: 07-Jun-1961  Encounter Date: 11/22/2014      PT End of Session - 11/22/14 1236    Visit Number 17   Number of Visits 26   Date for PT Re-Evaluation 12/09/14   Authorization Type medicare (gcode updated on visit #10)   Authorization - Visit Number 55   Authorization - Number of Visits 20   PT Start Time 1108   PT Stop Time 1152   PT Time Calculation (min) 44 min      Past Medical History  Diagnosis Date  . DVT of leg (deep venous thrombosis)     rt. leg  . Inferior vena caval thrombosis     Chronic  . DVT (deep venous thrombosis) 06/15/2011  . H/O PE (pulmonary embolism) 06/15/2011  . 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 Chronic anticoagulation 11/14/2014    No past surgical history on file.  There were no vitals taken for this visit.  Visit Diagnosis:  Open wound of knee, leg (except thigh), and ankle, complicated, right, subsequent encounter  Open wound of knee, leg (except thigh), and ankle, complicated, left, subsequent encounter         Wound Therapy - 11/22/14 1241    Subjective Pt comes today with silver hydrofiber still packed into wound.  Pt reports no pain or difficulty, continues to wear his juxtafit garment.   Wound Properties Date First Assessed: 10/16/14 Time First Assessed: 1045 Wound Type: Other (Comment) Location: Leg Location Orientation: Right;Anterior Wound Description (Comments): Mid anterior Right Lower leg Present on Admission: No   Dressing Type Silver hydrofiber   Dressing Status Old drainage   Dressing Change Frequency --  twice a week    Site / Wound Assessment Brown;Dusky;Yellow   % Wound base Red or Granulating 5%  2%    % Wound base Yellow 95%  98%   Peri-wound Assessment Edema   Drainage Amount Moderate   Drainage Description Purulent   Selective Debridement - Location wound bed   Selective Debridement - Tools Used Forceps;Scissors;Other (comment)   Selective Debridement - Tissue Removed slough   Wound Therapy - Clinical Statement Noted odor upon removal of silver hydrofiber from wound, however decreased adherence of slough with overall loosening.  Added pulse lavage to cleanse the wound more throughly with good results.  Able to debride more slough from wound today.  Completed manual lymph drainage for Rt LE wtih noted reduction of induration surrounding wound.  Continued with silver hydrofiber and patient given extra dressings for home.     Wound Plan continue with current woundcare utilizing pulse lavage, manual lymph drainage and sharps debridement.   Decrease Necrotic Tissue to 0   Decrease Necrotic Tissue - Progress Progressing toward goal   Increase Granulation Tissue to 100   Increase Granulation Tissue - Progress Progressing toward goal   Decrease Length/Width/Depth by (cm) wound to decrease by 1.5x1.5.1.5   Decrease Length/Width/Depth - Progress Progressing toward goal   Improve Drainage Characteristics Min   Improve Drainage Characteristics - Progress Progressing toward goal   Patient/Family will be able to  demonstrates full time wear of juxtafit B    Patient/Family Instruction Goal - Progress Met   Additional Wound Therapy Goal  Pt to report no pain    Additional Wound Therapy Goal - Progress Met          Problem List Patient Active Problem List   Diagnosis Date Noted  . Chronic anticoagulation 11/14/2014  . 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 Leg wound, left 08/19/2011  . DVT (deep venous thrombosis) 06/15/2011  . H/O PE (pulmonary embolism) 06/15/2011  . 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

## 2014-11-27 ENCOUNTER — Ambulatory Visit (HOSPITAL_COMMUNITY)
Admission: RE | Admit: 2014-11-27 | Discharge: 2014-11-27 | Disposition: A | Discharge status: Home / Self Care | Payer: Medicare Other | Source: Ambulatory Visit | Attending: Internal Medicine | Admitting: Internal Medicine

## 2014-11-27 DIAGNOSIS — S81001D Unspecified open wound, right knee, subsequent encounter: Secondary | ICD-10-CM | POA: Diagnosis not present

## 2014-11-27 DIAGNOSIS — Z86711 Personal history of pulmonary embolism: Secondary | ICD-10-CM | POA: Diagnosis not present

## 2014-11-27 DIAGNOSIS — Z86718 Personal history of other venous thrombosis and embolism: Secondary | ICD-10-CM | POA: Diagnosis not present

## 2014-11-27 DIAGNOSIS — S81002D Unspecified open wound, left knee, subsequent encounter: Secondary | ICD-10-CM

## 2014-11-27 DIAGNOSIS — S91001D Unspecified open wound, right ankle, subsequent encounter: Secondary | ICD-10-CM | POA: Diagnosis not present

## 2014-11-27 DIAGNOSIS — S81801D Unspecified open wound, right lower leg, subsequent encounter: Principal | ICD-10-CM

## 2014-11-27 DIAGNOSIS — S81802D Unspecified open wound, left lower leg, subsequent encounter: Secondary | ICD-10-CM

## 2014-11-27 DIAGNOSIS — S91002D Unspecified open wound, left ankle, subsequent encounter: Secondary | ICD-10-CM

## 2014-11-27 NOTE — Therapy (Signed)
[place], [place], [address] Phone: [phone]   Fax:  [phone]  Wound Care Therapy  Patient Details  Name: Matthew Wise MRN: [id number] Date of Birth: [birth year]  Encounter Date: 11/27/2014      PT End of Session - 11/27/14 1642    Visit Number 18   Number of Visits 26   Date for PT Re-Evaluation 12/09/14   Authorization Type medicare (gcode updated on visit #10)   Authorization - Visit Number 18   Authorization - Number of Visits 20   PT Start Time 1520   PT Stop Time 1608   PT Time Calculation (min) 48 min   Activity Tolerance Patient tolerated treatment well   Behavior During Therapy [facility] for tasks assessed/performed      Past Medical History  Diagnosis Date  . DVT of leg (deep venous thrombosis)     rt. leg  . Inferior vena caval thrombosis     Chronic  . DVT (deep venous thrombosis) 06/15/2011  . H/O PE (pulmonary embolism) 06/15/2011  . 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 Chronic anticoagulation 11/14/2014    No past surgical history on file.  There were no vitals taken for this visit.  Visit Diagnosis:  Open wound of knee, leg (except thigh), and ankle, complicated, right, subsequent encounter  Open wound of knee, leg (except thigh), and ankle, complicated, left, subsequent encounter    Date 11/09/2014 11/27/2014   Right Right  MCP 29.00 27.90  ankle 34 34.5  4cm 31.30 33.50  8cm 35.00 37.20  12 cm 41.70 42.30  16cm 47.00 47.60  20cm 52.60 54.00  24cm 55.50 56.60  28cm 57.40 55.80  32cm 53.80 54.00  36cm                                    Sum of squares 20185.79 20679.00  Total Volume 6568.1275 1700.1749           Wound Therapy - 11/27/14 1630    Subjective Pt states that there is no pain.  Comes to department with juxtalite on    Pain Score 0-No pain   Wound  Properties Date First Assessed: 10/16/14 Time First Assessed: 1045 Wound Type: Other (Comment) Location: Leg Location Orientation: Right;Anterior Wound Description (Comments): Mid anterior Right Lower leg Present on Admission: No   Dressing Type Silver hydrofiber   Dressing Status Old drainage   Dressing Change Frequency --  increase to three times a day    Site / Wound Assessment Brown;Dusky   % Wound base Red or Granulating 5%   % Wound base Yellow 95%   Peri-wound Assessment Edema   Wound Length (cm) 2.7 cm   Wound Width (cm) 2.4 cm   Wound Depth (cm) 1.4 cm   Undermining (cm) throughout wound but mainly around 10:00-12:00   Drainage Amount Minimal   Drainage Description Purulent;Odor   Treatment Cleansed;Debridement (Selective);Hydrotherapy (Pulse lavage)   Selective Debridement - Location wound bed   Selective Debridement - Tools Used Forceps;Scissors   Selective Debridement - Tissue Removed slough   Wound Therapy - Clinical Statement Pt edema has incrreased will return to profore wrapping to maintain compression.  Wound is beginning to show some signs of granulating tissue.  It appears we are close to debriding down to the  base of the woundbed    Wound Plan continue with current woundcare utilizing pulse lavage, manual lymph drainage and sharps debridement.   Manual Therapy manual decongestive techniques used to decrease lymph volume   Decrease Necrotic Tissue to 0   Decrease Necrotic Tissue - Progress Progressing toward goal   Increase Granulation Tissue to 100   Increase Granulation Tissue - Progress Progressing toward goal   Decrease Length/Width/Depth by (cm) wound to decrease by 1.5x1.5.1.5   Decrease Length/Width/Depth - Progress Progressing toward goal   Improve Drainage Characteristics Min   Improve Drainage Characteristics - Progress Progressing toward goal   Patient/Family will be able to  demonstrates full time wear of juxtafit B    Patient/Family Instruction Goal -  Progress Met   Additional Wound Therapy Goal Pt to report no pain    Additional Wound Therapy Goal - Progress Met             Problem List Patient Active Problem List   Diagnosis Date Noted  . Chronic anticoagulation 11/14/2014  . 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 Leg wound, left 08/19/2011  . DVT (deep venous thrombosis) 06/15/2011  . H/O PE (pulmonary embolism) 06/15/2011  . 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

## 2014-12-03 ENCOUNTER — Ambulatory Visit (HOSPITAL_COMMUNITY)
Admission: RE | Admit: 2014-12-03 | Discharge: 2014-12-03 | Disposition: A | Discharge status: Home / Self Care | Payer: Medicare Other | Source: Ambulatory Visit | Attending: Hematology and Oncology | Admitting: Hematology and Oncology

## 2014-12-03 DIAGNOSIS — M25461 Effusion, right knee: Secondary | ICD-10-CM | POA: Insufficient documentation

## 2014-12-03 DIAGNOSIS — M25471 Effusion, right ankle: Secondary | ICD-10-CM | POA: Insufficient documentation

## 2014-12-03 DIAGNOSIS — S81001D Unspecified open wound, right knee, subsequent encounter: Secondary | ICD-10-CM | POA: Diagnosis not present

## 2014-12-03 DIAGNOSIS — Z86718 Personal history of other venous thrombosis and embolism: Secondary | ICD-10-CM | POA: Diagnosis not present

## 2014-12-03 DIAGNOSIS — S91001D Unspecified open wound, right ankle, subsequent encounter: Secondary | ICD-10-CM | POA: Diagnosis not present

## 2014-12-03 DIAGNOSIS — S81002D Unspecified open wound, left knee, subsequent encounter: Secondary | ICD-10-CM

## 2014-12-03 DIAGNOSIS — Z86711 Personal history of pulmonary embolism: Secondary | ICD-10-CM | POA: Diagnosis not present

## 2014-12-03 DIAGNOSIS — S91002D Unspecified open wound, left ankle, subsequent encounter: Secondary | ICD-10-CM

## 2014-12-03 DIAGNOSIS — S81801D Unspecified open wound, right lower leg, subsequent encounter: Secondary | ICD-10-CM

## 2014-12-03 DIAGNOSIS — S81802D Unspecified open wound, left lower leg, subsequent encounter: Secondary | ICD-10-CM

## 2014-12-03 NOTE — Therapy (Signed)
[place], [place], [address] Phone: [phone]   Fax:  [phone]  Wound Care Therapy  Patient Details  Name: Matthew Wise MRN: [id number] Date of Birth: [birth year]  Encounter Date: 12/03/2014      PT End of Session - 12/03/14 1503    Visit Number 19   Number of Visits 26   Date for PT Re-Evaluation 12/09/14   Authorization Type medicare (gcode updated on visit #10)   Authorization - Visit Number 19   Authorization - Number of Visits 20   PT Start Time 9191   PT Stop Time 1448   PT Time Calculation (min) 56 min   Behavior During Therapy [facility] for tasks assessed/performed      Past Medical History  Diagnosis Date  . DVT of leg (deep venous thrombosis)     rt. leg  . Inferior vena caval thrombosis     Chronic  . DVT (deep venous thrombosis) 06/15/2011  . H/O PE (pulmonary embolism) 06/15/2011  . 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 Chronic anticoagulation 11/14/2014    No past surgical history on file.  There were no vitals taken for this visit.  Visit Diagnosis:  Open wound of knee, leg (except thigh), and ankle, complicated, right, subsequent encounter  Open wound of knee, leg (except thigh), and ankle, complicated, left, subsequent encounter          Wound Therapy - 12/03/14 1449    Subjective Pt states he has been noticing increased swelling as well once therapist stated that his swelling was up after last measurment and that pt may need to go back ot profore compression bandaging    Pain Score 0-No pain   Wound Properties Date First Assessed: 10/16/14 Time First Assessed: 1045 Wound Type: Other (Comment) Location: Leg Location Orientation: Right;Anterior Wound Description (Comments): Mid anterior Right Lower leg Present on Admission: No   Dressing Type Compression wrap;Silver hydrofiber   Dressing  Status Old drainage   Site / Wound Assessment Brown;Granulation tissue   % Wound base Red or Granulating 25%   % Wound base Yellow 75%   Peri-wound Assessment Edema   Undermining (cm) throughout wound bed silver packed into area    Margins Epibole (rolled edges)   Drainage Amount Minimal   Drainage Description Purulent;Odor   Treatment Cleansed;Debridement (Selective);Hydrotherapy (Pulse lavage);Other (Comment)  manual decongestive techniques to decrease edema   Selective Debridement - Location wound bed   Selective Debridement - Tools Used Forceps;Scissors   Selective Debridement - Tissue Removed slough   Wound Therapy - Clinical Statement Therapist spoke to pt about the possibilty of using short stretch bandages to decrease edema.  Will measure LE at the end of this week to see if edema is coming down if not we will give pt a list of the bandages he needs to acquire to begin short stretch bandaging    Factors Delaying/Impairing Wound Healing Altered sensation;Diabetes Mellitus;Infection - systemic/local   Hydrotherapy Plan Debridement;Dressing change;Patient/family education;Other (comment)   Wound Plan continue with pulse lavage, debridement and manual as well as profore multilayer compression bandage system.  Remeasure for volume on Friday.    Dressing  wound cleansed, moisturized, followed by silver hydrofiber being packed in wound with 4x4 over followed by profore dressing .    Manual Therapy manual decongestive techniques including supraclavicular, deep and suprafiscial abdominal; routing fluid both anterior  and posteriorly using the inguineal/ axillary anastomosis as well as LE    Decrease Necrotic Tissue to 0   Decrease Necrotic Tissue - Progress Progressing toward goal   Increase Granulation Tissue to 100   Increase Granulation Tissue - Progress Progressing toward goal   Decrease Length/Width/Depth by (cm) wound to decrease by 1.5x1.5.1.5   Decrease Length/Width/Depth - Progress  Progressing toward goal   Improve Drainage Characteristics Min   Improve Drainage Characteristics - Progress Met   Patient/Family will be able to  demonstrates full time wear of juxtafit B    Patient/Family Instruction Goal - Progress Met   Additional Wound Therapy Goal Pt to report no pain    Additional Wound Therapy Goal - Progress Met       G codes due next treatment.     Problem List Patient Active Problem List   Diagnosis Date Noted  . Chronic anticoagulation 11/14/2014  . 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 Leg wound, left 08/19/2011  . DVT (deep venous thrombosis) 06/15/2011  . H/O PE (pulmonary embolism) 06/15/2011  . 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    Trenton Passow,CINDYPT/CLT 12/03/2014, 3:05 PM  [place] [address] [place], [place], [address] Phone: [phone]   Fax:  [phone]

## 2014-12-05 ENCOUNTER — Ambulatory Visit (HOSPITAL_COMMUNITY): Payer: Medicare Other | Admitting: Physical Therapy

## 2014-12-07 ENCOUNTER — Ambulatory Visit (HOSPITAL_COMMUNITY)
Admission: RE | Admit: 2014-12-07 | Discharge: 2014-12-07 | Disposition: A | Discharge status: Home / Self Care | Payer: Medicare Other | Source: Ambulatory Visit | Attending: Internal Medicine | Admitting: Internal Medicine

## 2014-12-07 DIAGNOSIS — M25471 Effusion, right ankle: Secondary | ICD-10-CM | POA: Diagnosis not present

## 2014-12-07 DIAGNOSIS — S91001D Unspecified open wound, right ankle, subsequent encounter: Principal | ICD-10-CM

## 2014-12-07 DIAGNOSIS — Z86718 Personal history of other venous thrombosis and embolism: Secondary | ICD-10-CM | POA: Diagnosis not present

## 2014-12-07 DIAGNOSIS — S81001D Unspecified open wound, right knee, subsequent encounter: Secondary | ICD-10-CM

## 2014-12-07 DIAGNOSIS — S81002D Unspecified open wound, left knee, subsequent encounter: Secondary | ICD-10-CM

## 2014-12-07 DIAGNOSIS — S91002D Unspecified open wound, left ankle, subsequent encounter: Secondary | ICD-10-CM

## 2014-12-07 DIAGNOSIS — S81801D Unspecified open wound, right lower leg, subsequent encounter: Principal | ICD-10-CM

## 2014-12-07 DIAGNOSIS — S81802D Unspecified open wound, left lower leg, subsequent encounter: Secondary | ICD-10-CM

## 2014-12-07 DIAGNOSIS — M25461 Effusion, right knee: Secondary | ICD-10-CM | POA: Diagnosis not present

## 2014-12-07 DIAGNOSIS — Z86711 Personal history of pulmonary embolism: Secondary | ICD-10-CM | POA: Diagnosis not present

## 2014-12-07 NOTE — Therapy (Signed)
[place], [place], [address] Phone: [phone]   Fax:  [phone]  Wound Care Therapy  Patient Details  Name: Matthew Wise MRN: [id number] Date of Birth: 1960/12/04 Referring Provider:  Redmond School, MD  Encounter Date: 12/07/2014      PT End of Session - 12/07/14 1652    Visit Number 20   Number of Visits 32   Date for PT Re-Evaluation 01/06/15   Authorization Type medicare (gcode updated on visit #20)   Authorization - Visit Number 20   Authorization - Number of Visits 30   PT Start Time 4970   PT Stop Time 1438   PT Time Calculation (min) 43 min   Activity Tolerance Patient tolerated treatment well      Past Medical History  Diagnosis Date  . DVT of leg (deep venous thrombosis)     rt. leg  . Inferior vena caval thrombosis     Chronic  . DVT (deep venous thrombosis) 06/15/2011  . H/O PE (pulmonary embolism) 06/15/2011  . 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 Chronic anticoagulation 11/14/2014    No past surgical history on file.  There were no vitals taken for this visit.  Visit Diagnosis:  Open wound of knee, leg (except thigh), and ankle, complicated, right, subsequent encounter  Open wound of knee, leg (except thigh), and ankle, complicated, left, subsequent encounter          Wound Therapy - 12/07/14 1644    Subjective Pt states that the Profore came off after two days; he was unable to make his last appointment    Pain Score 0-No pain   Wound Properties Date First Assessed: 10/16/14 Time First Assessed: 1045 Wound Type: Other (Comment) Location: Leg Location Orientation: Right;Anterior Wound Description (Comments): Mid anterior Right Lower leg Present on Admission: No   Dressing Type Compression wrap;Silver hydrofiber   Dressing Status Old drainage   Site / Wound Assessment  Brown;Granulation tissue   % Wound base Red or Granulating 25%   % Wound base Yellow 75%   Peri-wound Assessment Edema   Wound Length (cm) 2.5 cm   Wound Width (cm) 2.2 cm   Wound Depth (cm) 1.6 cm   Undermining (cm) majority from 4:00 o'clock to 6.; as well as 12 o'clock   Margins Epibole (rolled edges)   Drainage Amount Minimal   Drainage Description Purulent;Odor   Selective Debridement - Location wound bed   Selective Debridement - Tools Used Forceps;Scissors   Selective Debridement - Tissue Removed slough   Wound Therapy - Clinical Statement Although wound size has not significantly decreased the wound was 100% necrotic and now there is some healty tissue showing.  Therapist edma is not decreasing will need to speak to pt about acquiring short stretch bandages and coming in for two treatment times for manual and debridementl   Factors Delaying/Impairing Wound Healing Altered sensation;Diabetes Mellitus;Infection - systemic/local   Wound Plan Have pt aquire short stretch bandages to decrease edema.    Decrease Necrotic Tissue to 0   Decrease Necrotic Tissue - Progress Progressing toward goal   Increase Granulation Tissue to 100   Increase Granulation Tissue - Progress Progressing toward goal   Decrease Length/Width/Depth by (cm) wound to decrease by 1.5x1.5.1.5   Decrease Length/Width/Depth - Progress Progressing toward goal   Improve Drainage Characteristics Min   Improve Drainage Characteristics - Progress  Met   Patient/Family will be able to  demonstrates full time wear of juxtafit B    Patient/Family Instruction Goal - Progress Met   Additional Wound Therapy Goal Pt to report no pain    Additional Wound Therapy Goal - Progress Met      continue to see pt 3 x a week for four weeks.  Have pt acquire short stretch bandages and have pt place on a double spot to allow for debridement as well as CDT.        G-Codes - 11-Dec-2014 1654    Functional Assessment Tool Used Clinical  judgment: Wound   Functional Limitation Other PT primary   Other PT Primary Current Status (F0277) At least 60 percent but less than 80 percent impaired, limited or restricted   Other PT Primary Goal Status (A1287) At least 20 percent but less than 40 percent impaired, limited or restricted       Problem List Patient Active Problem List   Diagnosis Date Noted  . Chronic anticoagulation 11/14/2014  . 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 Leg wound, left 08/19/2011  . DVT (deep venous thrombosis) 06/15/2011  . H/O PE (pulmonary embolism) 06/15/2011  . 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    RUSSELL,CINDY PT  2014-12-11, 4:55 PM  [place] [facility] [address] [place], [place], [address] Phone: [phone]   Fax:  [phone]   Your signature is required to indicate approval of the treatment plan as stated above.  Please sign and return making a copy for your files.  You may hard copy or send electronically.  Please check one: ___1.  Approve of this plan  ___2.  Approve of this plan with the following changes.   ____________________________                             _____________ Physician                                                                      Date

## 2014-12-10 ENCOUNTER — Ambulatory Visit (HOSPITAL_COMMUNITY)
Admission: RE | Admit: 2014-12-10 | Discharge: 2014-12-10 | Disposition: A | Discharge status: Home / Self Care | Payer: Medicare Other | Source: Ambulatory Visit | Attending: Internal Medicine | Admitting: Internal Medicine

## 2014-12-10 DIAGNOSIS — H00019 Hordeolum externum unspecified eye, unspecified eyelid: Secondary | ICD-10-CM | POA: Diagnosis not present

## 2014-12-10 DIAGNOSIS — S91002D Unspecified open wound, left ankle, subsequent encounter: Secondary | ICD-10-CM

## 2014-12-10 DIAGNOSIS — S81002D Unspecified open wound, left knee, subsequent encounter: Secondary | ICD-10-CM

## 2014-12-10 DIAGNOSIS — S81001D Unspecified open wound, right knee, subsequent encounter: Secondary | ICD-10-CM | POA: Diagnosis not present

## 2014-12-10 DIAGNOSIS — M25461 Effusion, right knee: Secondary | ICD-10-CM | POA: Diagnosis not present

## 2014-12-10 DIAGNOSIS — M25471 Effusion, right ankle: Secondary | ICD-10-CM | POA: Diagnosis not present

## 2014-12-10 DIAGNOSIS — S91001D Unspecified open wound, right ankle, subsequent encounter: Secondary | ICD-10-CM | POA: Diagnosis not present

## 2014-12-10 DIAGNOSIS — D2339 Other benign neoplasm of skin of other parts of face: Secondary | ICD-10-CM | POA: Diagnosis not present

## 2014-12-10 DIAGNOSIS — Z86711 Personal history of pulmonary embolism: Secondary | ICD-10-CM | POA: Diagnosis not present

## 2014-12-10 DIAGNOSIS — S81801D Unspecified open wound, right lower leg, subsequent encounter: Principal | ICD-10-CM

## 2014-12-10 DIAGNOSIS — Z86718 Personal history of other venous thrombosis and embolism: Secondary | ICD-10-CM | POA: Diagnosis not present

## 2014-12-10 DIAGNOSIS — S81802D Unspecified open wound, left lower leg, subsequent encounter: Secondary | ICD-10-CM

## 2014-12-10 NOTE — Therapy (Signed)
[name], [place], [address] Phone: [phone]   Fax:  [phone]  Wound Care Therapy  Patient Details  Name: Matthew Wise MRN: [id number] Date of Birth: December 22, 1960 Referring Provider:  Redmond School, MD  Encounter Date: 12/10/2014      PT End of Session - 12/10/14 1739    Visit Number 21   Number of Visits 32   Date for PT Re-Evaluation 01/06/15   Authorization Type medicare (gcode updated on visit #20)   Authorization - Visit Number 21   Authorization - Number of Visits 30   PT Start Time 4562   PT Stop Time 1345   PT Time Calculation (min) 40 min   Activity Tolerance Patient tolerated treatment well   Behavior During Therapy [name] for tasks assessed/performed      Past Medical History  Diagnosis Date  . DVT of leg (deep venous thrombosis)     rt. leg  . Inferior vena caval thrombosis     Chronic  . DVT (deep venous thrombosis) 06/15/2011  . H/O PE (pulmonary embolism) 06/15/2011  . 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 Chronic anticoagulation 11/14/2014    No past surgical history on file.  There were no vitals taken for this visit.  Visit Diagnosis:  Open wound of knee, leg (except thigh), and ankle, complicated, right, subsequent encounter  Open wound of knee, leg (except thigh), and ankle, complicated, left, subsequent encounter                 Wound Therapy - 12/10/14 1733    Subjective Pt states that the Profore came off after two days; he was unable to make his last appointment    Pain Score 0-No pain   Wound Properties Date First Assessed: 10/16/14 Time First Assessed: 1045 Wound Type: Other (Comment) Location: Leg Location Orientation: Right;Anterior Wound Description (Comments): Mid anterior Right Lower leg Present on Admission: No   Dressing Type Compression wrap;Silver  hydrofiber   Dressing Changed New   Dressing Status Old drainage   Site / Wound Assessment Brown;Granulation tissue;Other (Comment)  greenish   % Wound base Red or Granulating 25%   % Wound base Yellow 75%   Peri-wound Assessment Edema   Margins Epibole (rolled edges)   Drainage Amount Minimal   Drainage Description Purulent;Odor   Treatment Cleansed;Debridement (Selective);Hydrotherapy (Pulse lavage)   Selective Debridement - Location wound bed   Selective Debridement - Tools Used Forceps;Scissors   Selective Debridement - Tissue Removed slough   Wound Therapy - Clinical Statement Wound appears to be healing in around edges, however continues with purulent drainage/odor.  Able to debride majority of slough from edges.  Eschar remains adherent in bottom of wound bed.  Silver hydrofiber used followed by donning of compression garment.  Pt given order sheet to get short stretch bandages to begin.  Unable to complete manual today due to time.  Secretary informed that patient would need extended appointment time.    Factors Delaying/Impairing Wound Healing Altered sensation;Diabetes Mellitus;Infection - systemic/local   Wound Plan Continue woundcare, MLD and begin bandaging with short stretch when received.     Dressing  silver hydrofiber   Dressing 4X4   Decrease Necrotic Tissue to 0   Decrease Necrotic Tissue - Progress Progressing toward goal   Increase Granulation Tissue to 100   Increase Granulation Tissue - Progress Progressing toward goal  Decrease Length/Width/Depth by (cm) wound to decrease by 1.5x1.5.1.5   Decrease Length/Width/Depth - Progress Progressing toward goal   Improve Drainage Characteristics Min   Improve Drainage Characteristics - Progress Progressing toward goal   Patient/Family will be able to  demonstrates full time wear of juxtafit B    Patient/Family Instruction Goal - Progress Met   Additional Wound Therapy Goal Pt to report no pain    Additional Wound Therapy  Goal - Progress Met                              Problem List Patient Active Problem List   Diagnosis Date Noted  . Chronic anticoagulation 11/14/2014  . 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 Leg wound, left 08/19/2011  . DVT (deep venous thrombosis) 06/15/2011  . H/O PE (pulmonary embolism) 06/15/2011  . 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 [place], [place], [address] Phone: [phone]   Fax:  [phone]

## 2014-12-12 ENCOUNTER — Ambulatory Visit (HOSPITAL_COMMUNITY)
Admission: RE | Admit: 2014-12-12 | Discharge: 2014-12-12 | Disposition: A | Discharge status: Home / Self Care | Payer: Medicare Other | Source: Ambulatory Visit | Attending: Internal Medicine | Admitting: Internal Medicine

## 2014-12-12 DIAGNOSIS — S91001D Unspecified open wound, right ankle, subsequent encounter: Secondary | ICD-10-CM | POA: Diagnosis not present

## 2014-12-12 DIAGNOSIS — Z86711 Personal history of pulmonary embolism: Secondary | ICD-10-CM | POA: Diagnosis not present

## 2014-12-12 DIAGNOSIS — S81001D Unspecified open wound, right knee, subsequent encounter: Secondary | ICD-10-CM

## 2014-12-12 DIAGNOSIS — S81802D Unspecified open wound, left lower leg, subsequent encounter: Secondary | ICD-10-CM

## 2014-12-12 DIAGNOSIS — M25461 Effusion, right knee: Secondary | ICD-10-CM | POA: Diagnosis not present

## 2014-12-12 DIAGNOSIS — S81002D Unspecified open wound, left knee, subsequent encounter: Secondary | ICD-10-CM

## 2014-12-12 DIAGNOSIS — M25471 Effusion, right ankle: Secondary | ICD-10-CM | POA: Diagnosis not present

## 2014-12-12 DIAGNOSIS — Z86718 Personal history of other venous thrombosis and embolism: Secondary | ICD-10-CM | POA: Diagnosis not present

## 2014-12-12 DIAGNOSIS — S91002D Unspecified open wound, left ankle, subsequent encounter: Secondary | ICD-10-CM

## 2014-12-12 DIAGNOSIS — S81801D Unspecified open wound, right lower leg, subsequent encounter: Principal | ICD-10-CM

## 2014-12-12 NOTE — Therapy (Signed)
[place], [place], [address] Phone: [phone]   Fax:  [phone]  Wound Care Therapy  Patient Details  Name: Matthew Wise MRN: [id number] Date of Birth: [birth year] Referring Provider:  Redmond School, MD  Encounter Date: 12/12/2014      PT End of Session - 12/12/14 1625    Visit Number 22   Number of Visits 32   Authorization Type medicare (gcode updated on visit #20)   Authorization - Visit Number 38   Authorization - Number of Visits 30   PT Start Time 1350   PT Stop Time 1505   PT Time Calculation (min) 75 min      Past Medical History  Diagnosis Date  . DVT of leg (deep venous thrombosis)     rt. leg  . Inferior vena caval thrombosis     Chronic  . DVT (deep venous thrombosis) 06/15/2011  . H/O PE (pulmonary embolism) 06/15/2011  . 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 Chronic anticoagulation 11/14/2014    No past surgical history on file.  There were no vitals taken for this visit.  Visit Diagnosis:  Open wound of knee, leg (except thigh), and ankle, complicated, right, subsequent encounter  Open wound of knee, leg (except thigh), and ankle, complicated, left, subsequent encounter        Wound Therapy - 12/12/14 1612    Subjective Pt states that he has ordered the short stretch bandages.  Pt states that his wound tends to drain quite a bit    Pain Score 0-No pain   Wound Properties Date First Assessed: 10/16/14 Time First Assessed: 1045 Wound Type: Other (Comment) Location: Leg Location Orientation: Right;Anterior Wound Description (Comments): Mid anterior Right Lower leg Present on Admission: No   Dressing Type Alginate;Compression wrap;Moist to moist   Dressing Changed New  Pt comes in with juxtafit on    Site / Wound Assessment Red;Yellow   % Wound base Red or Granulating 80%   % Wound  base Yellow 20%   Peri-wound Assessment Edema   Tunneling (cm) at 6:00   Undermining (cm) a4-5:30 and 12:00   Margins Epibole (rolled edges)   Treatment Cleansed;Debridement (Selective);Hydrotherapy (Pulse lavage);Packing (Saline gauze)  followed by alginate for drainage    Selective Debridement - Location edges of wound bed especially where wound is undermining and tunneling ; epibole edges   Selective Debridement - Tools Used Forceps   Selective Debridement - Tissue Removed slough   Wound Therapy - Clinical Statement Wound is significantly cleaner may discontinue pulse lavage.  Pt should have short stretch bandages in the next two days.  Foam was cut to leg length in preparation of wrapping with short stretch bandages.     Factors Delaying/Impairing Wound Healing Altered sensation;Diabetes Mellitus;Infection - systemic/local   Hydrotherapy Plan Debridement;Dressing change;Other (comment)  manual decongestive techniques.    Wound Plan continue with debridement and manual techniques begin short stretch multilayer bandaging as soon as bandages are acquired.    Dressing  moist to moist with packing into undrermining and tunneling followed by alginate and profore dressing    Manual Therapy manual decongestive techniques completed both anteriorly and postereiorly.  To include supraclavicular, deep and superficial abdominal, routing fluid using the inguinal/axillary anastomosis and Rt LE.    Decrease Necrotic Tissue to 1   Decrease Necrotic Tissue - Progress Progressing toward goal  Increase Granulation Tissue to 100   Increase Granulation Tissue - Progress Progressing toward goal   Decrease Length/Width/Depth by (cm) wound to decrease by 1.5x1.5.1.5   Decrease Length/Width/Depth - Progress Progressing toward goal   Improve Drainage Characteristics Min   Improve Drainage Characteristics - Progress Progressing toward goal   Patient/Family will be able to  demonstrates full time wear of juxtafit B     Patient/Family Instruction Goal - Progress Met   Additional Wound Therapy Goal Pt to report no pain    Additional Wound Therapy Goal - Progress Met        Problem List Patient Active Problem List   Diagnosis Date Noted  . Chronic anticoagulation 11/14/2014  . 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 Leg wound, left 08/19/2011  . DVT (deep venous thrombosis) 06/15/2011  . H/O PE (pulmonary embolism) 06/15/2011  . 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

## 2014-12-14 ENCOUNTER — Ambulatory Visit (HOSPITAL_COMMUNITY)
Admission: RE | Admit: 2014-12-14 | Discharge: 2014-12-14 | Disposition: A | Discharge status: Home / Self Care | Payer: Medicare Other | Source: Ambulatory Visit | Attending: Oncology | Admitting: Oncology

## 2014-12-14 DIAGNOSIS — S91001D Unspecified open wound, right ankle, subsequent encounter: Secondary | ICD-10-CM | POA: Diagnosis not present

## 2014-12-14 DIAGNOSIS — Z86711 Personal history of pulmonary embolism: Secondary | ICD-10-CM | POA: Diagnosis not present

## 2014-12-14 DIAGNOSIS — S81802D Unspecified open wound, left lower leg, subsequent encounter: Secondary | ICD-10-CM

## 2014-12-14 DIAGNOSIS — Z86718 Personal history of other venous thrombosis and embolism: Secondary | ICD-10-CM | POA: Diagnosis not present

## 2014-12-14 DIAGNOSIS — S81001D Unspecified open wound, right knee, subsequent encounter: Secondary | ICD-10-CM | POA: Diagnosis not present

## 2014-12-14 DIAGNOSIS — S81801D Unspecified open wound, right lower leg, subsequent encounter: Principal | ICD-10-CM

## 2014-12-14 DIAGNOSIS — M25461 Effusion, right knee: Secondary | ICD-10-CM | POA: Diagnosis not present

## 2014-12-14 DIAGNOSIS — S91002D Unspecified open wound, left ankle, subsequent encounter: Secondary | ICD-10-CM

## 2014-12-14 DIAGNOSIS — M25471 Effusion, right ankle: Secondary | ICD-10-CM | POA: Diagnosis not present

## 2014-12-14 DIAGNOSIS — S81002D Unspecified open wound, left knee, subsequent encounter: Secondary | ICD-10-CM

## 2014-12-14 NOTE — Therapy (Signed)
[place], [place], [address] Phone: [phone]   Fax:  [phone]  Wound Care Therapy  Patient Details  Name: Matthew Wise MRN: [id number] Date of Birth: June 02, 1961 Referring Provider:  Baird Cancer, PA-C  Encounter Date: 12/14/2014      PT End of Session - 12/14/14 1522    Visit Number 23   Number of Visits 32   Date for PT Re-Evaluation 01/06/15   Authorization Type medicare (gcode updated on visit #20)   Authorization - Visit Number 43   Authorization - Number of Visits 30   PT Start Time 2035   PT Stop Time 1510   PT Time Calculation (min) 81 min   Activity Tolerance Patient tolerated treatment well      Past Medical History  Diagnosis Date  . DVT of leg (deep venous thrombosis)     rt. leg  . Inferior vena caval thrombosis     Chronic  . DVT (deep venous thrombosis) 06/15/2011  . H/O PE (pulmonary embolism) 06/15/2011  . 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 Chronic anticoagulation 11/14/2014    No past surgical history on file.  There were no vitals taken for this visit.  Visit Diagnosis:  Open wound of knee, leg (except thigh), and ankle, complicated, right, subsequent encounter  Open wound of knee, leg (except thigh), and ankle, complicated, left, subsequent encounter                 Wound Therapy - 12/14/14 1512    Subjective Pt states that he kept the profore on until this morning.  Pt comes to department with short stretch bandages    Pain Score 0-No pain   Wound Properties Date First Assessed: 10/16/14 Time First Assessed: 1045 Wound Type: Other (Comment) Location: Leg Location Orientation: Right;Anterior Wound Description (Comments): Mid anterior Right Lower leg Present on Admission: No   Dressing Type Moist to moist   Dressing Changed New   Site / Wound Assessment  Granulation tissue;Red;Yellow   % Wound base Red or Granulating 85%   % Wound base Yellow 15%   Peri-wound Assessment Edema   Margins Epibole (rolled edges)   Drainage Amount Minimal   Drainage Description Serous   Treatment Cleansed;Debridement (Selective)  manual    Selective Debridement - Location edges of wound bed especially where wound is undermining and tunneling ; epibole edges   Selective Debridement - Tools Used Forceps   Selective Debridement - Tissue Removed slough   Wound Therapy - Clinical Statement Pt wound continues to improve in granulation tissue.  Pt able to be bandaged with foam and multi layer  short stretch bandages today.    Factors Delaying/Impairing Wound Healing Altered sensation;Diabetes Mellitus;Infection - systemic/local   Hydrotherapy Plan Debridement;Dressing change;Patient/family education;Other (comment)  manual decongestive techniques.   Wound Plan continue with bandages as well as complete decongestive therapy.  Pt was measured today for volumes.  Measure wound on Monday of next week; volume on Friday.    Dressing  moist to moist dressing followed by multilayer short stretch bandages and foam.    Manual Therapy decongestve manual techniques including supraclavicular, deep and superficial abdominal, Rt inguinal axillary anatomosis both anterior and posterior as wel as Rt LE    Decrease Necrotic Tissue to 0   Decrease Necrotic Tissue - Progress Progressing toward goal   Increase Granulation Tissue to  100   Increase Granulation Tissue - Progress Progressing toward goal   Decrease Length/Width/Depth by (cm) wound to decrease by 1.5x1.5.1.5   Decrease Length/Width/Depth - Progress Progressing toward goal   Improve Drainage Characteristics Min   Improve Drainage Characteristics - Progress Met           Problem List Patient Active Problem List   Diagnosis Date Noted  . Chronic anticoagulation 11/14/2014  . 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 Leg wound, left 08/19/2011  . DVT (deep venous thrombosis) 06/15/2011  . H/O PE (pulmonary embolism) 06/15/2011  . 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 [place], [place], [address] Phone: [phone]   Fax:  [phone]

## 2014-12-18 ENCOUNTER — Ambulatory Visit (HOSPITAL_COMMUNITY)
Admission: RE | Admit: 2014-12-18 | Discharge: 2014-12-18 | Disposition: A | Discharge status: Home / Self Care | Payer: Medicare Other | Source: Ambulatory Visit | Attending: Internal Medicine | Admitting: Internal Medicine

## 2014-12-18 DIAGNOSIS — S91001D Unspecified open wound, right ankle, subsequent encounter: Principal | ICD-10-CM

## 2014-12-18 DIAGNOSIS — S81802D Unspecified open wound, left lower leg, subsequent encounter: Secondary | ICD-10-CM

## 2014-12-18 DIAGNOSIS — S81002D Unspecified open wound, left knee, subsequent encounter: Secondary | ICD-10-CM

## 2014-12-18 DIAGNOSIS — S81801D Unspecified open wound, right lower leg, subsequent encounter: Principal | ICD-10-CM

## 2014-12-18 DIAGNOSIS — Z86718 Personal history of other venous thrombosis and embolism: Secondary | ICD-10-CM | POA: Diagnosis not present

## 2014-12-18 DIAGNOSIS — S81001D Unspecified open wound, right knee, subsequent encounter: Secondary | ICD-10-CM

## 2014-12-18 DIAGNOSIS — S91002D Unspecified open wound, left ankle, subsequent encounter: Secondary | ICD-10-CM

## 2014-12-18 DIAGNOSIS — M25471 Effusion, right ankle: Secondary | ICD-10-CM | POA: Diagnosis not present

## 2014-12-18 DIAGNOSIS — Z86711 Personal history of pulmonary embolism: Secondary | ICD-10-CM | POA: Diagnosis not present

## 2014-12-18 DIAGNOSIS — M25461 Effusion, right knee: Secondary | ICD-10-CM | POA: Diagnosis not present

## 2014-12-18 NOTE — Therapy (Signed)
[place], [place], [address] Phone: [phone]   Fax:  [phone]  Wound Care Therapy  Patient Details  Name: Matthew Wise MRN: [id number] Date of Birth: [birth year] Referring Provider:  Redmond School, MD  Encounter Date: 12/18/2014    Past Medical History  Diagnosis Date  . DVT of leg (deep venous thrombosis)     rt. leg  . Inferior vena caval thrombosis     Chronic  . DVT (deep venous thrombosis) 06/15/2011  . H/O PE (pulmonary embolism) 06/15/2011  . 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 Chronic anticoagulation 11/14/2014    No past surgical history on file.  There were no vitals taken for this visit.  Visit Diagnosis:  Open wound of knee, leg (except thigh), and ankle, complicated, right, subsequent encounter  Open wound of knee, leg (except thigh), and ankle, complicated, left, subsequent encounter          Wound Therapy - 12/18/14 1554    Subjective Pt states that the short stretch bandages stayed on and comfortable until yesterday morning.  Pt states he washed the garments but forgot to bring them back.     Pain Score 0-No pain   Wound Properties Date First Assessed: 10/16/14 Time First Assessed: 1045 Wound Type: Other (Comment) Location: Leg Location Orientation: Right;Anterior Wound Description (Comments): Mid anterior Right Lower leg Present on Admission: No   Dressing Type Moist to moist   Dressing Changed New   Site / Wound Assessment Granulation tissue;Red;Yellow   % Wound base Red or Granulating 85%   % Wound base Yellow 15%   Peri-wound Assessment Edema   Margins Epibole (rolled edges)   Drainage Amount Minimal   Drainage Description Serous   Treatment Cleansed;Debridement (Selective)   Selective Debridement - Location edges of wound bed especially where wound is undermining and tunneling  ; epibole edges   Selective Debridement - Tools Used Forceps   Selective Debridement - Tissue Removed slough   Wound Therapy - Clinical Statement Pt wound continues to improve in granulation tissue.  Pt able to be bandaged with foam and multi layer  short stretch bandages using a 10 cm 10 m bandage that pt had left, a 6 cm from the department and therapist cut pt new foam.    Factors Delaying/Impairing Wound Healing Altered sensation;Diabetes Mellitus;Infection - systemic/local   Hydrotherapy Plan Debridement;Dressing change;Patient/family education;Other (comment)  manual decongestive techniques.   Wound Plan measure both volume and wound size next treatment.    Dressing  moist to moist dressing followed by multilayer short stretch bandages and foam.    Manual Therapy decongestve manual techniques including supraclavicular, deep and superficial abdominal, Rt inguinal axillary anatomosis both anterior and posterior as wel as Rt LE    Decrease Necrotic Tissue to 0   Decrease Necrotic Tissue - Progress Progressing toward goal   Increase Granulation Tissue to 100   Increase Granulation Tissue - Progress Progressing toward goal   Decrease Length/Width/Depth by (cm) wound to decrease by 1.5x1.5.1.5   Decrease Length/Width/Depth - Progress Progressing toward goal   Improve Drainage Characteristics Min   Improve Drainage Characteristics - Progress Met   Patient/Family will be able to  Pt to demonstrate full time wearing of juxtalite   Patient/Family Instruction Goal - Progress Met   Additional Wound Therapy Goal pt to report no pain   Additional Wound Therapy Goal -  Progress Met        Problem List Patient Active Problem List   Diagnosis Date Noted  . Chronic anticoagulation 11/14/2014  . 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 Leg wound, left 08/19/2011  . DVT (deep venous thrombosis) 06/15/2011  . H/O PE (pulmonary embolism) 06/15/2011  .  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

## 2014-12-20 ENCOUNTER — Ambulatory Visit (HOSPITAL_COMMUNITY)
Admission: RE | Admit: 2014-12-20 | Discharge: 2014-12-20 | Disposition: A | Discharge status: Home / Self Care | Payer: Medicare Other | Source: Ambulatory Visit | Attending: Internal Medicine | Admitting: Internal Medicine

## 2014-12-20 DIAGNOSIS — M25461 Effusion, right knee: Secondary | ICD-10-CM | POA: Diagnosis not present

## 2014-12-20 DIAGNOSIS — S91002D Unspecified open wound, left ankle, subsequent encounter: Secondary | ICD-10-CM

## 2014-12-20 DIAGNOSIS — S91001D Unspecified open wound, right ankle, subsequent encounter: Secondary | ICD-10-CM | POA: Diagnosis not present

## 2014-12-20 DIAGNOSIS — S81802D Unspecified open wound, left lower leg, subsequent encounter: Secondary | ICD-10-CM

## 2014-12-20 DIAGNOSIS — S81001D Unspecified open wound, right knee, subsequent encounter: Secondary | ICD-10-CM | POA: Diagnosis not present

## 2014-12-20 DIAGNOSIS — M25471 Effusion, right ankle: Secondary | ICD-10-CM | POA: Diagnosis not present

## 2014-12-20 DIAGNOSIS — S81801D Unspecified open wound, right lower leg, subsequent encounter: Principal | ICD-10-CM

## 2014-12-20 DIAGNOSIS — Z86718 Personal history of other venous thrombosis and embolism: Secondary | ICD-10-CM | POA: Diagnosis not present

## 2014-12-20 DIAGNOSIS — Z86711 Personal history of pulmonary embolism: Secondary | ICD-10-CM | POA: Diagnosis not present

## 2014-12-20 DIAGNOSIS — S81002D Unspecified open wound, left knee, subsequent encounter: Secondary | ICD-10-CM

## 2014-12-20 NOTE — Therapy (Signed)
[place], [place], [address] Phone: [phone]   Fax:  [phone]  Wound Care Therapy  Patient Details  Name: Matthew Wise MRN: [id number] Date of Birth: [birth year] Referring Provider:  Redmond School, MD  Encounter Date: 12/20/2014      PT End of Session - 12/20/14 1539    Visit Number 25   Number of Visits 32   Date for PT Re-Evaluation 01/06/15   Authorization Type medicare (gcode updated on visit #20)   Authorization - Visit Number 25   Authorization - Number of Visits 30   PT Start Time 6283   PT Stop Time 1530   PT Time Calculation (min) 55 min   Activity Tolerance Patient tolerated treatment well   Behavior During Therapy [facility] for tasks assessed/performed      Past Medical History  Diagnosis Date  . DVT of leg (deep venous thrombosis)     rt. leg  . Inferior vena caval thrombosis     Chronic  . DVT (deep venous thrombosis) 06/15/2011  . H/O PE (pulmonary embolism) 06/15/2011  . 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 Chronic anticoagulation 11/14/2014    No past surgical history on file.  There were no vitals taken for this visit.  Visit Diagnosis:  Open wound of knee, leg (except thigh), and ankle, complicated, right, subsequent encounter  Open wound of knee, leg (except thigh), and ankle, complicated, left, subsequent encounter          Wound Therapy - 12/20/14 1533    Subjective Pt states he just removed the short stretch bandages prior to appts.  Reports overall comfort with the bandages.    Pain Score 0-No pain   Wound Properties Date First Assessed: 10/16/14 Time First Assessed: 1045 Wound Type: Other (Comment) Location: Leg Location Orientation: Right;Anterior Wound Description (Comments): Mid anterior Right Lower leg Present on Admission: No   Dressing Type Moist to moist   Dressing  Changed Changed   Site / Wound Assessment Granulation tissue;Red;Yellow   % Wound base Red or Granulating 85%   % Wound base Yellow 15%   Peri-wound Assessment Intact   Margins Epibole (rolled edges)   Drainage Amount Minimal   Drainage Description Serous   Treatment Cleansed;Debridement (Selective)   Selective Debridement - Location edges of wound bed especially where wound is undermining and tunneling ; epibole edges   Selective Debridement - Tools Used Forceps   Selective Debridement - Tissue Removed slough   Wound Therapy - Clinical Statement Pt wound continues to improve in granulation tissue.  Pt able to be bandaged with foam and multi layer  short stretch bandages using a 10 cm 10 m bandage that pt had left, a 6 cm from the department and therapist cut pt new foam.    Factors Delaying/Impairing Wound Healing Altered sensation;Diabetes Mellitus;Infection - systemic/local   Hydrotherapy Plan Debridement;Dressing change;Patient/family education;Other (comment)  manual decongestive techniques.   Wound Plan measure both volume and wound size next treatment.    Dressing  moist to moist dressing followed by multilayer short stretch bandages and foam.    Manual Therapy manual lymph drainage for Rt LE.   manual techniques including supraclavicular, deep and superficial abdominal, Rt inguinal axillary anatomosis both anterior and posterior followed by cleansing, moisturizing and multi layer compression dressings using 1/2" foam and short stretch bandages.    Decrease Necrotic Tissue  to 0   Decrease Necrotic Tissue - Progress Progressing toward goal   Increase Granulation Tissue to 100   Increase Granulation Tissue - Progress Progressing toward goal   Decrease Length/Width/Depth by (cm) wound to decrease by 1.5x1.5.1.5   Decrease Length/Width/Depth - Progress Progressing toward goal   Improve Drainage Characteristics Min   Improve Drainage Characteristics - Progress Met   Patient/Family will  be able to  Pt to demonstrate full time wearing of juxtalite   Patient/Family Instruction Goal - Progress Met   Additional Wound Therapy Goal pt to report no pain   Additional Wound Therapy Goal - Progress Met        Problem List Patient Active Problem List   Diagnosis Date Noted  . Chronic anticoagulation 11/14/2014  . 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 Leg wound, left 08/19/2011  . DVT (deep venous thrombosis) 06/15/2011  . H/O PE (pulmonary embolism) 06/15/2011  . 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

## 2014-12-24 ENCOUNTER — Ambulatory Visit (HOSPITAL_COMMUNITY)
Admission: RE | Admit: 2014-12-24 | Discharge: 2014-12-24 | Disposition: A | Discharge status: Home / Self Care | Payer: Medicare Other | Source: Ambulatory Visit | Attending: Internal Medicine | Admitting: Internal Medicine

## 2014-12-24 DIAGNOSIS — S91001D Unspecified open wound, right ankle, subsequent encounter: Secondary | ICD-10-CM | POA: Diagnosis not present

## 2014-12-24 DIAGNOSIS — S81002D Unspecified open wound, left knee, subsequent encounter: Secondary | ICD-10-CM

## 2014-12-24 DIAGNOSIS — M25461 Effusion, right knee: Secondary | ICD-10-CM | POA: Diagnosis not present

## 2014-12-24 DIAGNOSIS — Z86718 Personal history of other venous thrombosis and embolism: Secondary | ICD-10-CM | POA: Diagnosis not present

## 2014-12-24 DIAGNOSIS — S81001D Unspecified open wound, right knee, subsequent encounter: Secondary | ICD-10-CM

## 2014-12-24 DIAGNOSIS — S81801D Unspecified open wound, right lower leg, subsequent encounter: Principal | ICD-10-CM

## 2014-12-24 DIAGNOSIS — Z86711 Personal history of pulmonary embolism: Secondary | ICD-10-CM | POA: Diagnosis not present

## 2014-12-24 DIAGNOSIS — M25471 Effusion, right ankle: Secondary | ICD-10-CM | POA: Diagnosis not present

## 2014-12-24 DIAGNOSIS — S91002D Unspecified open wound, left ankle, subsequent encounter: Secondary | ICD-10-CM

## 2014-12-24 DIAGNOSIS — S81802D Unspecified open wound, left lower leg, subsequent encounter: Secondary | ICD-10-CM

## 2014-12-24 NOTE — Therapy (Signed)
[place], [place], [address] Phone: [phone]   Fax:  [phone]  Physical Therapy Treatment  Patient Details  Name: Matthew Wise MRN: [id number] Date of Birth: 1961-02-09 Referring Provider:  Redmond School, MD  Encounter Date: 12/24/2014      PT End of Session - 12/24/14 1636    Visit Number 26   Number of Visits 32   Date for PT Re-Evaluation 01/06/15   Authorization Type medicare (gcode updated on visit #20)   Authorization - Visit Number 26   Authorization - Number of Visits 30   PT Start Time 1525   PT Stop Time 1625   PT Time Calculation (min) 60 min   Activity Tolerance Patient tolerated treatment well   Behavior During Therapy [facility] for tasks assessed/performed      Past Medical History  Diagnosis Date  . DVT of leg (deep venous thrombosis)     rt. leg  . Inferior vena caval thrombosis     Chronic  . DVT (deep venous thrombosis) 06/15/2011  . H/O PE (pulmonary embolism) 06/15/2011  . 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 Chronic anticoagulation 11/14/2014    No past surgical history on file.  There were no vitals taken for this visit.  Visit Diagnosis:  Open wound of knee, leg (except thigh), and ankle, complicated, right, subsequent encounter  Open wound of knee, leg (except thigh), and ankle, complicated, left, subsequent encounter   Circumferential measurements of Rt LE measured in centimeters (every 4 cm)   Date 11/09/2014 11/27/2014 12/24/2014   right right right  MTP 29.00 27.90 27.3  ankle 34 34.5 33.60  4cm 31.30 33.50 31.00  8cm 35.00 37.20 34.30  12 cm 41.70 42.30 40.00  16cm 47.00 47.60 45.00  20cm 52.60 54.00 50.00  24cm 55.50 56.60 53.60  28cm 57.40 55.80 53.00  32cm 53.80 54.00 51.00       Sum of squares 20185.79 20679.00 18419.70  Total Volume 7096.2836 6294.7654  5863.175          Wound Therapy - 12/24/14 1627    Subjective Pt states he just washed his bandages this morning.  Reminded patient not to dry his bandages in the dryer.   Pain Score 0-No pain   Wound Properties Date First Assessed: 10/16/14 Time First Assessed: 1045 Wound Type: Other (Comment) Location: Leg Location Orientation: Right;Anterior Wound Description (Comments): Mid anterior Right Lower leg Present on Admission: No   Dressing Type Alginate   Dressing Changed Changed   Site / Wound Assessment Granulation tissue;Red;Yellow   % Wound base Red or Granulating 90%   % Wound base Yellow 10%   Peri-wound Assessment Intact   Wound Length (cm) 1.8 cm  was 2.5 cm   Wound Width (cm) 1.5 cm  was 2.2   Wound Depth (cm) 1 cm  was 1.6 cm   Margins Epibole (rolled edges)   Drainage Amount Minimal   Drainage Description Serous   Treatment Cleansed;Debridement (Selective)   Selective Debridement - Location entire wound bed.   Selective Debridement - Tools Used Forceps   Selective Debridement - Tissue Removed slough   Wound Therapy - Clinical Statement Continued improvement with increased granulation, decreased wound size and decreased Rt LE circumference. Total volume loss of 719.16 cc since 12/29.   Integrity of skin also improving.     Factors Delaying/Impairing Wound Healing Altered sensation;Diabetes  Mellitus;Infection - systemic/local   Hydrotherapy Plan Debridement;Dressing change;Patient/family education;Other (comment)  manual decongestive techniques.   Wound Plan Continue MLD followed by woundcare and compression bandaging.   Dressing  moist to moist dressing followed by multilayer short stretch bandages and foam.    Manual Therapy manual lymph drainage for Rt LE.   manual techniques including supraclavicular, deep and superficial abdominal, Rt inguinal axillary anatomosis both anterior and posterior followed by cleansing, moisturizing and multi layer compression dressings using  1/2" foam and short stretch bandages.    Decrease Necrotic Tissue to 0   Decrease Necrotic Tissue - Progress Progressing toward goal   Increase Granulation Tissue to 100   Increase Granulation Tissue - Progress Progressing toward goal   Decrease Length/Width/Depth by (cm) wound to decrease by 1.5x1.5.1.5   Decrease Length/Width/Depth - Progress Progressing toward goal   Improve Drainage Characteristics Other (comment)  none   Improve Drainage Characteristics - Progress Progressing toward goal   Patient/Family will be able to  --   Additional Wound Therapy Goal --       Problem List Patient Active Problem List   Diagnosis Date Noted  . Chronic anticoagulation 11/14/2014  . 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 Leg wound, left 08/19/2011  . DVT (deep venous thrombosis) 06/15/2011  . H/O PE (pulmonary embolism) 06/15/2011  . 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 [place], [place], [address] Phone: [phone]   Fax:  [phone]

## 2014-12-26 ENCOUNTER — Ambulatory Visit (HOSPITAL_COMMUNITY): Payer: Medicare Other | Admitting: Physical Therapy

## 2014-12-28 ENCOUNTER — Ambulatory Visit (HOSPITAL_COMMUNITY): Payer: Medicare Other

## 2014-12-28 ENCOUNTER — Ambulatory Visit (HOSPITAL_COMMUNITY): Payer: Medicare Other | Admitting: Oncology

## 2014-12-31 ENCOUNTER — Ambulatory Visit (HOSPITAL_COMMUNITY)
Admission: RE | Admit: 2014-12-31 | Discharge: 2014-12-31 | Disposition: A | Discharge status: Home / Self Care | Payer: Medicare Other | Source: Ambulatory Visit | Attending: Hematology and Oncology | Admitting: Hematology and Oncology

## 2014-12-31 DIAGNOSIS — S91002D Unspecified open wound, left ankle, subsequent encounter: Secondary | ICD-10-CM

## 2014-12-31 DIAGNOSIS — S81801D Unspecified open wound, right lower leg, subsequent encounter: Secondary | ICD-10-CM

## 2014-12-31 DIAGNOSIS — S91001D Unspecified open wound, right ankle, subsequent encounter: Secondary | ICD-10-CM | POA: Diagnosis not present

## 2014-12-31 DIAGNOSIS — S81001D Unspecified open wound, right knee, subsequent encounter: Secondary | ICD-10-CM | POA: Insufficient documentation

## 2014-12-31 DIAGNOSIS — Z86718 Personal history of other venous thrombosis and embolism: Secondary | ICD-10-CM | POA: Diagnosis not present

## 2014-12-31 DIAGNOSIS — S81802D Unspecified open wound, left lower leg, subsequent encounter: Secondary | ICD-10-CM

## 2014-12-31 DIAGNOSIS — Z86711 Personal history of pulmonary embolism: Secondary | ICD-10-CM | POA: Diagnosis not present

## 2014-12-31 DIAGNOSIS — S81002D Unspecified open wound, left knee, subsequent encounter: Secondary | ICD-10-CM

## 2014-12-31 NOTE — Therapy (Signed)
[place], [place], [address] Phone: [phone]   Fax:  [phone]  Wound Care Therapy  Patient Details  Name: Matthew Wise MRN: [id number] Date of Birth: [birth year] Referring Provider:  Redmond School, MD  Encounter Date: 12/31/2014      PT End of Session - 12/31/14 1600    Visit Number 27   Number of Visits 32   Date for PT Re-Evaluation 01/06/15   Authorization Type medicare (gcode updated on visit #20)   Authorization - Visit Number 27   Authorization - Number of Visits 30   PT Start Time 0350   PT Stop Time 1525   PT Time Calculation (min) 47 min   Activity Tolerance Patient tolerated treatment well   Behavior During Therapy [facility] for tasks assessed/performed      Past Medical History  Diagnosis Date  . DVT of leg (deep venous thrombosis)     rt. leg  . Inferior vena caval thrombosis     Chronic  . DVT (deep venous thrombosis) 06/15/2011  . H/O PE (pulmonary embolism) 06/15/2011  . 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 Chronic anticoagulation 11/14/2014    No past surgical history on file.  There were no vitals taken for this visit.  Visit Diagnosis:  Open wound of knee, leg (except thigh), and ankle, complicated, right, subsequent encounter  Open wound of knee, leg (except thigh), and ankle, complicated, left, subsequent encounter          Wound Therapy - 12/31/14 1557    Subjective Pt reports significant improvments with his wound and LE.    Pain Score 0-No pain   Wound Properties Date First Assessed: 10/16/14 Time First Assessed: 1045 Wound Type: Other (Comment) Location: Leg Location Orientation: Right;Anterior Wound Description (Comments): Mid anterior Right Lower leg Present on Admission: No   Dressing Type Alginate   Dressing Changed Changed   Site / Wound Assessment Granulation  tissue;Red;Yellow   % Wound base Red or Granulating 95%   % Wound base Yellow 5%   Peri-wound Assessment Intact   Margins Attached edges (approximated)   Drainage Amount Minimal   Drainage Description Serous   Treatment Cleansed;Debridement (Selective)   Selective Debridement - Location entire wound bed.   Selective Debridement - Tools Used Forceps   Selective Debridement - Tissue Removed slough   Wound Therapy - Clinical Statement Wound continues to decrease in size with overall reduction in slough.  Slough easily debrided from wound borders with increased approximation of edges.  Wound without undermining.    Factors Delaying/Impairing Wound Healing Altered sensation;Diabetes Mellitus;Infection - systemic/local   Hydrotherapy Plan Debridement;Dressing change;Patient/family education;Other (comment)  manual decongestive techniques.   Wound Plan Continue MLD followed by woundcare and compression bandaging.   Dressing  hydrofiber followed by multilayer short stretch bandages.     Manual Therapy manual lymph drainage for Rt LE.   manual techniques including supraclavicular, deep and superficial abdominal, Rt inguinal axillary anatomosis both anterior and posterior followed by cleansing, moisturizing and multi layer compression dressings using 1/2" foam and short stretch bandages.    Decrease Necrotic Tissue to 0   Decrease Necrotic Tissue - Progress Progressing toward goal   Increase Granulation Tissue to 100   Increase Granulation Tissue - Progress Progressing toward goal   Decrease Length/Width/Depth by (cm) wound to decrease by 1.5x1.5.1.5   Decrease Length/Width/Depth - Progress Progressing  toward goal   Improve Drainage Characteristics Other (comment)  none   Improve Drainage Characteristics - Progress Progressing toward goal        Problem List Patient Active Problem List   Diagnosis Date Noted  . Chronic anticoagulation 11/14/2014  . 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 Leg wound, left 08/19/2011  . DVT (deep venous thrombosis) 06/15/2011  . H/O PE (pulmonary embolism) 06/15/2011  . 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 [place], [place], [address] Phone: [phone]   Fax:  [phone]

## 2015-01-02 ENCOUNTER — Ambulatory Visit (HOSPITAL_COMMUNITY): Payer: Medicare Other | Admitting: Physical Therapy

## 2015-01-04 ENCOUNTER — Ambulatory Visit (HOSPITAL_COMMUNITY)
Admission: RE | Admit: 2015-01-04 | Discharge: 2015-01-04 | Disposition: A | Discharge status: Home / Self Care | Payer: Medicare Other | Source: Ambulatory Visit | Attending: Internal Medicine | Admitting: Internal Medicine

## 2015-01-04 DIAGNOSIS — S91002D Unspecified open wound, left ankle, subsequent encounter: Secondary | ICD-10-CM

## 2015-01-04 DIAGNOSIS — S91001D Unspecified open wound, right ankle, subsequent encounter: Principal | ICD-10-CM

## 2015-01-04 DIAGNOSIS — S81001D Unspecified open wound, right knee, subsequent encounter: Secondary | ICD-10-CM | POA: Diagnosis not present

## 2015-01-04 DIAGNOSIS — S81002D Unspecified open wound, left knee, subsequent encounter: Secondary | ICD-10-CM

## 2015-01-04 DIAGNOSIS — Z86711 Personal history of pulmonary embolism: Secondary | ICD-10-CM | POA: Diagnosis not present

## 2015-01-04 DIAGNOSIS — Z86718 Personal history of other venous thrombosis and embolism: Secondary | ICD-10-CM | POA: Diagnosis not present

## 2015-01-04 DIAGNOSIS — S81802D Unspecified open wound, left lower leg, subsequent encounter: Secondary | ICD-10-CM

## 2015-01-04 DIAGNOSIS — S81801D Unspecified open wound, right lower leg, subsequent encounter: Principal | ICD-10-CM

## 2015-01-04 NOTE — Therapy (Signed)
[place], [place], [address] Phone: [phone]   Fax:  [phone]  Wound Care Therapy  Patient Details  Name: Matthew Wise MRN: [id number] Date of Birth: 01-23-1961 Referring Provider:  Redmond School, MD  Encounter Date: 01/04/2015      PT End of Session - 01/04/15 1606    Visit Number 28   Number of Visits 32   Date for PT Re-Evaluation 01/06/15   Authorization - Visit Number 54   Authorization - Number of Visits 30   PT Start Time 1400  Pt 15 minutes late for appointment   PT Stop Time 1503   PT Time Calculation (min) 63 min      Past Medical History  Diagnosis Date  . DVT of leg (deep venous thrombosis)     rt. leg  . Inferior vena caval thrombosis     Chronic  . DVT (deep venous thrombosis) 06/15/2011  . H/O PE (pulmonary embolism) 06/15/2011  . 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 Chronic anticoagulation 11/14/2014    No past surgical history on file.  There were no vitals taken for this visit.  Visit Diagnosis:  Open wound of knee, leg (except thigh), and ankle, complicated, right, subsequent encounter  Open wound of knee, leg (except thigh), and ankle, complicated, left, subsequent encounter                 Wound Therapy - 01/04/15 1602    Subjective Pt has no pain but the wound is starting to itch.   Pain Score 0-No pain   Wound Properties Date First Assessed: 10/16/14 Time First Assessed: 1045 Wound Type: Other (Comment) Location: Leg Location Orientation: Right;Anterior Wound Description (Comments): Mid anterior Right Lower leg Present on Admission: No   Dressing Type Alginate   Site / Wound Assessment Granulation tissue;Red;Yellow   % Wound base Red or Granulating 85%   % Wound base Yellow 15%   Peri-wound Assessment Intact   Wound Length (cm) 2 cm   Wound Width (cm) 1.5 cm    Wound Depth (cm) 0.6 cm   Margins Attached edges (approximated)   Drainage Amount Minimal   Drainage Description Serous   Treatment Cleansed;Debridement (Selective)  moisturized followed by complete decongestive techniques.   Selective Debridement - Location entire wound bed.   Selective Debridement - Tools Used Forceps;Scissors   Selective Debridement - Tissue Removed slough  very adherent slough from 10 to 2 o'clock   Wound Therapy - Clinical Statement Wound continues to decrease in size with overall reduction in slough.  Slough easily debrided from wound borders with increased approximation of edges.  Wound without undermining.    Factors Delaying/Impairing Wound Healing Altered sensation;Diabetes Mellitus;Infection - systemic/local   Hydrotherapy Plan Debridement;Dressing change;Patient/family education;Other (comment)  manual decongestive techniques.   Wound Plan Continue MLD followed by woundcare and compression bandaging.   Dressing  hydrofiber followed by foam and  multilayer short stretch bandages.     Manual Therapy manual lymph drainage for Rt LE.   manual techniques including supraclavicular, deep and superficial abdominal, Rt inguinal axillary anatomosis both anterior and posterior followed by cleansing, moisturizing and multi layer compression dressings using 1/2" foam and short stretch bandages.    Decrease Necrotic Tissue to 0   Increase Granulation Tissue to 100   Decrease Length/Width/Depth by (cm) wound to decrease by 1.5x1.5.1.5   Improve Drainage  Characteristics Other (comment)  none        Problem List Patient Active Problem List   Diagnosis Date Noted  . Chronic anticoagulation 11/14/2014  . 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 Leg wound, left 08/19/2011  . DVT (deep venous thrombosis) 06/15/2011  . H/O PE (pulmonary embolism) 06/15/2011  . 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 [place], [place], [address] Phone: [phone]   Fax:  [phone]

## 2015-01-07 ENCOUNTER — Ambulatory Visit (HOSPITAL_COMMUNITY)
Admission: RE | Admit: 2015-01-07 | Discharge: 2015-01-07 | Disposition: A | Discharge status: Home / Self Care | Payer: Medicare Other | Source: Ambulatory Visit | Attending: Physical Medicine & Rehabilitation | Admitting: Physical Medicine & Rehabilitation

## 2015-01-07 DIAGNOSIS — Z86711 Personal history of pulmonary embolism: Secondary | ICD-10-CM | POA: Diagnosis not present

## 2015-01-07 DIAGNOSIS — S81002D Unspecified open wound, left knee, subsequent encounter: Secondary | ICD-10-CM

## 2015-01-07 DIAGNOSIS — S91001D Unspecified open wound, right ankle, subsequent encounter: Secondary | ICD-10-CM | POA: Diagnosis not present

## 2015-01-07 DIAGNOSIS — S81801D Unspecified open wound, right lower leg, subsequent encounter: Principal | ICD-10-CM

## 2015-01-07 DIAGNOSIS — S91002D Unspecified open wound, left ankle, subsequent encounter: Secondary | ICD-10-CM

## 2015-01-07 DIAGNOSIS — S81001D Unspecified open wound, right knee, subsequent encounter: Secondary | ICD-10-CM | POA: Diagnosis not present

## 2015-01-07 DIAGNOSIS — Z86718 Personal history of other venous thrombosis and embolism: Secondary | ICD-10-CM | POA: Diagnosis not present

## 2015-01-07 DIAGNOSIS — S81802D Unspecified open wound, left lower leg, subsequent encounter: Secondary | ICD-10-CM

## 2015-01-07 NOTE — Therapy (Signed)
[place], [place], [address] Phone: [phone]   Fax:  [phone]  Wound Care Therapy  Patient Details  Name: Matthew Wise MRN: [id number] Date of Birth: 27-Oct-1961 Referring Provider:  Meredith Staggers, MD  Encounter Date: 01/07/2015      PT End of Session - 01/07/15 1614    Visit Number 29   Number of Visits 32   Authorization Type medicare (gcode updated on visit #20)   Authorization - Visit Number 29   Authorization - Number of Visits 30   PT Start Time 1350   PT Stop Time 1504   PT Time Calculation (min) 74 min      Past Medical History  Diagnosis Date  . DVT of leg (deep venous thrombosis)     rt. leg  . Inferior vena caval thrombosis     Chronic  . DVT (deep venous thrombosis) 06/15/2011  . H/O PE (pulmonary embolism) 06/15/2011  . 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 Chronic anticoagulation 11/14/2014    No past surgical history on file.  There were no vitals taken for this visit.  Visit Diagnosis:  Open wound of knee, leg (except thigh), and ankle, complicated, right, subsequent encounter  Open wound of knee, leg (except thigh), and ankle, complicated, left, subsequent encounter      11/09/2014 11/27/2014 12/06/2114 12/14/2014 01/07/2014  Right Right Right right RT  29.00 27.90 28.5 28 28   34 34.5 34.20 32.00 33.60  31.30 33.50 33.30 31.90 33.70  35.00 37.20 37.30 35.60 38.80  41.70 42.30 42.00 42.50 42.00  47.00 47.60 49.00 50.10 50.50  52.60 54.00 54.60 54.50 54.50  55.50 56.60 56.30 57.10 56.20  57.40 55.80 54.90 56.30 54.30  53.80 54.00 53.50 53.00 51.50                                                          20185.79 20679.00 #VALUE! 20618.58 20597.77  6425.3388 9470.9628 #VALUE! 3662.9476 6556.476                Wound Therapy - 01/07/15 1606    Subjective Pt states  he was able to keep his bandages on until Sat. night.   Pain Score 0-No pain   Wound Properties Date First Assessed: 10/16/14 Time First Assessed: 1045 Wound Type: Other (Comment) Location: Leg Location Orientation: Right;Anterior Wound Description (Comments): Mid anterior Right Lower leg Present on Admission: No   Dressing Type Gauze (Comment)  wet to moist using Carraklenze   Site / Wound Assessment Granulation tissue;Red;Yellow   % Wound base Red or Granulating 85%   % Wound base Yellow 15%   Peri-wound Assessment Intact   Margins Epibole (rolled edges)   Drainage Amount Minimal   Drainage Description Serous   Treatment Cleansed;Debridement (Selective)  manual for lymphedema   Selective Debridement - Location entire wound bed.   Selective Debridement - Tools Used Forceps;Scissors   Selective Debridement - Tissue Removed slough  very adherent slough from 10 to 2 o'clock   Wound Therapy - Clinical Statement Treatment focused on debridement of epiboled edges with 70% success    Factors Delaying/Impairing Wound Healing Altered sensation;Diabetes Mellitus;Infection - systemic/local   Hydrotherapy Plan Debridement;Dressing change;Patient/family  education;Other (comment)  manual decongestive techniques.   Wound Plan Continue MLD followed by woundcare and compression bandaging.   Dressing  wet to moist Carraklenz gauze    Manual Therapy manual lymph drainage for Rt LE.   manual techniques including supraclavicular, deep and superficial abdominal, Rt inguinal axillary anatomosis both anterior and posterior followed by cleansing, moisturizing and multi layer compression dressings using 1/2" foam and short stretch bandages.    Decrease Necrotic Tissue to 0   Decrease Necrotic Tissue - Progress Progressing toward goal   Increase Granulation Tissue to 100   Increase Granulation Tissue - Progress Progressing toward goal   Decrease Length/Width/Depth by (cm) wound to decrease by 1.5x1.5.1.5    Decrease Length/Width/Depth - Progress Progressing toward goal   Improve Drainage Characteristics Other (comment)  none   Patient/Family will be able to  Pt to demonstrate full time wearing of juxtalite   Patient/Family Instruction Goal - Progress Met            Plan - 01/07/15 1616    Clinical Impression Statement Wound edged continue to be epiboled.  Treatment concentrated on removing epiboled skin.  Pt wound does not appear to be draining as much therefore changed dressing to wet to moist.    PT Next Visit Plan G-Code due          Problem List Patient Active Problem List   Diagnosis Date Noted  . Chronic anticoagulation 11/14/2014  . 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 Leg wound, left 08/19/2011  . DVT (deep venous thrombosis) 06/15/2011  . H/O PE (pulmonary embolism) 06/15/2011  . 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 [place], [place], [address] Phone: [phone]   Fax:  [phone]

## 2015-01-09 ENCOUNTER — Ambulatory Visit (HOSPITAL_COMMUNITY): Payer: Medicare Other | Admitting: Physical Therapy

## 2015-01-11 ENCOUNTER — Telehealth (HOSPITAL_COMMUNITY): Payer: Self-pay | Admitting: Oncology

## 2015-01-11 ENCOUNTER — Ambulatory Visit (HOSPITAL_COMMUNITY)
Admission: RE | Admit: 2015-01-11 | Discharge: 2015-01-11 | Disposition: A | Discharge status: Home / Self Care | Payer: Medicare Other | Source: Ambulatory Visit | Attending: Internal Medicine | Admitting: Internal Medicine

## 2015-01-11 DIAGNOSIS — S91002D Unspecified open wound, left ankle, subsequent encounter: Secondary | ICD-10-CM

## 2015-01-11 DIAGNOSIS — S81801D Unspecified open wound, right lower leg, subsequent encounter: Principal | ICD-10-CM

## 2015-01-11 DIAGNOSIS — S81002D Unspecified open wound, left knee, subsequent encounter: Secondary | ICD-10-CM

## 2015-01-11 DIAGNOSIS — S81802D Unspecified open wound, left lower leg, subsequent encounter: Secondary | ICD-10-CM

## 2015-01-11 DIAGNOSIS — S81001D Unspecified open wound, right knee, subsequent encounter: Secondary | ICD-10-CM | POA: Diagnosis not present

## 2015-01-11 DIAGNOSIS — Z86711 Personal history of pulmonary embolism: Secondary | ICD-10-CM | POA: Diagnosis not present

## 2015-01-11 DIAGNOSIS — Z86718 Personal history of other venous thrombosis and embolism: Secondary | ICD-10-CM | POA: Diagnosis not present

## 2015-01-11 DIAGNOSIS — S91001D Unspecified open wound, right ankle, subsequent encounter: Principal | ICD-10-CM

## 2015-01-11 NOTE — Therapy (Signed)
[place], [place], [address] Phone: [phone]   Fax:  [phone]  Wound Care Therapy  Patient Details  Name: ORIAN FIGUEIRA MRN: [id number] Date of Birth: 1961-02-16 Referring Provider:  Redmond School, MD  Encounter Date: 01/11/2015      PT End of Session - 01/11/15 1515    Visit Number 30   Number of Visits 32   Date for PT Re-Evaluation 02/10/15   Authorization Type medicare (gcode updated on visit #30)   Authorization - Visit Number 30   Authorization - Number of Visits 40   PT Start Time 8115   PT Stop Time 1450   PT Time Calculation (min) 55 min      Past Medical History  Diagnosis Date  . DVT of leg (deep venous thrombosis)     rt. leg  . Inferior vena caval thrombosis     Chronic  . DVT (deep venous thrombosis) 06/15/2011  . H/O PE (pulmonary embolism) 06/15/2011  . 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 Chronic anticoagulation 11/14/2014    No past surgical history on file.  There were no vitals taken for this visit.  Visit Diagnosis:  Open wound of knee, leg (except thigh), and ankle, complicated, right, subsequent encounter  Open wound of knee, leg (except thigh), and ankle, complicated, left, subsequent encounter                 Wound Therapy - 01/11/15 1509    Subjective Pt states that he has no pain.     Pain Score 0-No pain   Wound Properties Date First Assessed: 10/16/14 Time First Assessed: 1045 Wound Type: Other (Comment) Location: Leg Location Orientation: Right;Anterior Wound Description (Comments): Mid anterior Right Lower leg Present on Admission: No   Dressing Type Gauze (Comment)  wet to moist using Karraclenze   Site / Wound Assessment Granulation tissue;Red;Yellow   % Wound base Red or Granulating 90%   % Wound base Yellow 10%   Peri-wound Assessment Intact   Wound Length (cm) 2 cm   Wound Width (cm) 1.5 cm   Wound Depth (cm) 0.5 cm   Margins Epibole (rolled edges)   Drainage Amount Minimal   Drainage Description Serous   Treatment Cleansed;Debridement (Selective)  manual decongestive techniques with short stretch bandaging   Selective Debridement - Location entire wound bed.   Selective Debridement - Tools Used Forceps;Scissors   Selective Debridement - Tissue Removed slough   Wound Therapy - Clinical Statement Pt wound continues to slowly fill in.  Wound edges are no longer epiboled .   Factors Delaying/Impairing Wound Healing Altered sensation;Diabetes Mellitus;Infection - systemic/local   Hydrotherapy Plan Debridement;Dressing change;Patient/family education;Other (comment)  manual decongestive techniques.   Wound Plan Continue MLD followed by woundcare and compression bandaging.   Dressing  wet to moist Karraclenz gauze    Manual Therapy manual lymph drainage for Rt LE.   manual techniques including supraclavicular, deep and superficial abdominal, Rt inguinal axillary anatomosis both anterior and posterior followed by cleansing, moisturizing and multi layer compression dressings using 1/2" foam and short stretch bandages.    Decrease Necrotic Tissue to 0   Decrease Necrotic Tissue - Progress Progressing toward goal   Increase Granulation Tissue to 100   Increase Granulation Tissue - Progress Progressing toward goal   Decrease Length/Width/Depth by (cm) wound to decrease by 1.5x1.5.1.5   Decrease Length/Width/Depth -  Progress Progressing toward goal   Improve Drainage Characteristics Other (comment)  none   Patient/Family will be able to  Pt to demonstrate full time wearing of juxtalite   Patient/Family Instruction Goal - Progress Met                 PT Education - 07-Feb-2015 1514    Education provided Yes   Education Details Instructed to take bandages off if pt has increased pain    Person(s) Educated Patient   Methods  Explanation   Comprehension Verbalized understanding             G-Codes - 2015-02-07 1516    Functional Assessment Tool Used Clinical judgment: Wound   Functional Limitation Other PT primary   Other PT Primary Current Status (E8520) At least 60 percent but less than 80 percent impaired, limited or restricted   Other PT Primary Goal Status (N0509) At least 20 percent but less than 40 percent impaired, limited or restricted       Problem List Patient Active Problem List   Diagnosis Date Noted  . Chronic anticoagulation 11/14/2014  . 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 Leg wound, left 08/19/2011  . DVT (deep venous thrombosis) 06/15/2011  . H/O PE (pulmonary embolism) 06/15/2011  . 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 [place], [place], [address] Phone: [phone]   Fax:  [phone]

## 2015-01-14 ENCOUNTER — Ambulatory Visit (HOSPITAL_COMMUNITY): Payer: Medicare Other | Admitting: Physical Therapy

## 2015-01-15 ENCOUNTER — Other Ambulatory Visit (HOSPITAL_COMMUNITY): Payer: Self-pay | Admitting: Oncology

## 2015-01-15 DIAGNOSIS — I82409 Acute embolism and thrombosis of unspecified deep veins of unspecified lower extremity: Secondary | ICD-10-CM

## 2015-01-15 MED ORDER — OXYCODONE HCL 5 MG PO CAPS: 5.0000 mg | ORAL_CAPSULE | ORAL | Status: DC | PRN

## 2015-01-16 ENCOUNTER — Ambulatory Visit (HOSPITAL_COMMUNITY): Payer: Medicare Other | Admitting: Physical Therapy

## 2015-01-18 ENCOUNTER — Ambulatory Visit (HOSPITAL_COMMUNITY): Payer: Medicare Other | Admitting: Physical Therapy

## 2015-01-18 DIAGNOSIS — Z86711 Personal history of pulmonary embolism: Secondary | ICD-10-CM | POA: Diagnosis not present

## 2015-01-18 DIAGNOSIS — Z86718 Personal history of other venous thrombosis and embolism: Secondary | ICD-10-CM | POA: Diagnosis not present

## 2015-01-18 DIAGNOSIS — S91001D Unspecified open wound, right ankle, subsequent encounter: Secondary | ICD-10-CM | POA: Diagnosis not present

## 2015-01-18 DIAGNOSIS — S81001D Unspecified open wound, right knee, subsequent encounter: Secondary | ICD-10-CM | POA: Diagnosis not present

## 2015-01-18 NOTE — Therapy (Signed)
[place], [place], [address] Phone: [phone]   Fax:  [phone]  Wound Care Evaluation  Patient Details  Name: Matthew Wise MRN: [id number] Date of Birth: Apr 27, 1961 Referring Provider:  Redmond School, MD  Encounter Date: 01/18/2015      PT End of Session - 01/18/15 1634    Visit Number 31   Number of Visits 40   Date for PT Re-Evaluation 02/10/15   Authorization - Visit Number 43   Authorization - Number of Visits 40   PT Start Time 3976   PT Stop Time 1500   PT Time Calculation (min) 65 min      Past Medical History  Diagnosis Date  . DVT of leg (deep venous thrombosis)     rt. leg  . Inferior vena caval thrombosis     Chronic  . DVT (deep venous thrombosis) 06/15/2011  . H/O PE (pulmonary embolism) 06/15/2011  . 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 Chronic anticoagulation 11/14/2014    No past surgical history on file.  There were no vitals taken for this visit.  Visit Diagnosis:  No diagnosis found.         Wound Therapy - 01/18/15 1630    Subjective Pt states that he lost his appointments and was not sure when he was to come in.(reason for missied appointment)   Pain Score 0-No pain   Wound Properties Date First Assessed: 10/16/14 Time First Assessed: 1045 Wound Type: Other (Comment) Location: Leg Location Orientation: Right;Anterior Wound Description (Comments): Mid anterior Right Lower leg Present on Admission: No   Dressing Type Gauze (Comment)  wet to moist using Karraclenze   Site / Wound Assessment Granulation tissue;Red;Yellow   % Wound base Red or Granulating 90%   % Wound base Yellow 10%   Peri-wound Assessment Intact   Wound Length (cm) 2 cm   Wound Width (cm) 1.5 cm   Wound Depth (cm) 0.5 cm   Margins Epibole (rolled edges)   Drainage Amount Minimal   Drainage Description  Serous   Treatment Cleansed;Debridement (Selective)  manual decongestive techniques to decrease edema   Selective Debridement - Location entire wound bed.   Selective Debridement - Tools Used Forceps;Scalpel;Scissors   Selective Debridement - Tissue Removed slough   Wound Therapy - Clinical Statement Wound edges Epiboled again used scapel and forceps to pull skin away with success.    Factors Delaying/Impairing Wound Healing Altered sensation;Diabetes Mellitus;Infection - systemic/local   Hydrotherapy Plan Debridement;Dressing change;Patient/family education;Other (comment)  manual decongestive techniques.   Wound Plan Continue MLD followed by woundcare and compression bandaging.   Dressing  wet to moist Karraclenz gauze    Manual Therapy manual lymph drainage for Rt LE.   manual techniques including supraclavicular, deep and superficial abdominal, Rt inguinal axillary anatomosis both anterior and posterior followed by cleansing, moisturizing and multi layer compression dressings using 1/2" foam and short stretch bandages.    Decrease Necrotic Tissue to 0   Decrease Necrotic Tissue - Progress Progressing toward goal   Increase Granulation Tissue to 100   Increase Granulation Tissue - Progress Progressing toward goal   Decrease Length/Width/Depth by (cm) wound to decrease by 1.5x1.5.1.5   Decrease Length/Width/Depth - Progress Progressing toward goal   Improve Drainage Characteristics Other (comment)  none   Patient/Family will be able to  Pt to demonstrate full time wearing of juxtalite  Patient/Family Instruction Goal - Progress Met            Plan - 01/18/15 1636    PT Next Visit Plan Remeasure leg for volume next visit         Problem List Patient Active Problem List   Diagnosis Date Noted  . Chronic anticoagulation 11/14/2014  . 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 Leg wound, left 08/19/2011  . DVT (deep venous thrombosis)  06/15/2011  . H/O PE (pulmonary embolism) 06/15/2011  . 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

## 2015-01-21 ENCOUNTER — Ambulatory Visit (HOSPITAL_COMMUNITY): Payer: Medicare Other | Admitting: Physical Therapy

## 2015-01-21 DIAGNOSIS — Z86718 Personal history of other venous thrombosis and embolism: Secondary | ICD-10-CM | POA: Diagnosis not present

## 2015-01-21 DIAGNOSIS — S81001D Unspecified open wound, right knee, subsequent encounter: Secondary | ICD-10-CM | POA: Diagnosis not present

## 2015-01-21 DIAGNOSIS — S91001D Unspecified open wound, right ankle, subsequent encounter: Secondary | ICD-10-CM | POA: Diagnosis not present

## 2015-01-21 DIAGNOSIS — Z86711 Personal history of pulmonary embolism: Secondary | ICD-10-CM | POA: Diagnosis not present

## 2015-01-21 NOTE — Therapy (Signed)
[facility], [place], [address] Phone: [phone]   Fax:  [phone]  Wound Care Therapy  Patient Details  Name: Matthew Wise MRN: [id number] Date of Birth: 1961-04-01 Referring Provider:  Redmond School, MD  Encounter Date: 01/21/2015      PT End of Session - 01/21/15 1417    Visit Number 32   Number of Visits 40   Date for PT Re-Evaluation 02/10/15   Authorization Type medicare (gcode updated on visit #30)   Authorization - Visit Number 60   Authorization - Number of Visits 40   PT Start Time 1594   PT Stop Time 1500   PT Time Calculation (min) 75 min   Activity Tolerance Patient tolerated treatment well   Behavior During Therapy [facility] for tasks assessed/performed      Past Medical History  Diagnosis Date  . DVT of leg (deep venous thrombosis)     rt. leg  . Inferior vena caval thrombosis     Chronic  . DVT (deep venous thrombosis) 06/15/2011  . H/O PE (pulmonary embolism) 06/15/2011  . 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 Chronic anticoagulation 11/14/2014    No past surgical history on file.  There were no vitals taken for this visit.  Visit Diagnosis:  No diagnosis found.      Date 11/09/2014 11/27/2014 12/06/2114 12/14/2014 01/07/2014 01/21/2015   Right Right Right right RT Rt  MCP 29.00 27.90 28.5 28 28  28.6  ankle 34 34.5 34.20 32.00 33.60 33.50  4cm 31.30 33.50 33.30 31.90 33.70 34.50  8cm 35.00 37.20 37.30 35.60 38.80 38.40  12 cm 41.70 42.30 42.00 42.50 42.00 44.60  16cm 47.00 47.60 49.00 50.10 50.50 49.90  20cm 52.60 54.00 54.60 54.50 54.50 54.50  24cm 55.50 56.60 56.30 57.10 56.20 57.00  28cm 57.40 55.80 54.90 56.30 54.30 54.00  32cm 53.80 54.00 53.50 53.00 51.50 52.30  36cm                                                                          Sum of squares 20185.79 20679.00  #VALUE! 20618.58 70761.51 20954.73  Total Volume 8343.7357 8978.4784 #VALUE! 1282.0813 6556.476 6670.1001          Wound Therapy - 01/21/15 1413    Subjective Pt state that he has been up on his feet shopping with his daughter all day.  Pt took bandages off Saturday night.    Pain Score 0-No pain   Wound Properties Date First Assessed: 10/16/14 Time First Assessed: 1045 Wound Type: Other (Comment) Location: Leg Location Orientation: Right;Anterior Wound Description (Comments): Mid anterior Right Lower leg Present on Admission: No   Dressing Type Gauze (Comment)  wet to moist using hydrogel   Site / Wound Assessment Granulation tissue;Red;Yellow   % Wound base Red or Granulating 90%   % Wound base Yellow 10%   Peri-wound Assessment Intact   Wound Length (cm) 1.5 cm   Wound Width (cm) 1.2 cm   Wound Depth (cm) 0.6 cm   Margins Epibole (rolled edges)   Drainage Amount Minimal   Drainage Description Serous   Treatment  Cleansed;Debridement (Selective);Other (Comment)   Selective Debridement - Location entire wound bed.   Selective Debridement - Tools Used Forceps;Scalpel;Scissors   Selective Debridement - Tissue Removed slough   Wound Therapy - Clinical Statement Wound edges Epiboled again used scapel and forceps to pull skin away with success.    Factors Delaying/Impairing Wound Healing Altered sensation;Diabetes Mellitus;Infection - systemic/local   Hydrotherapy Plan Debridement;Dressing change;Patient/family education;Other (comment)  manual decongestive techniques.   Wound Plan Continue MLD followed by woundcare and compression bandaging.   Dressing  wet to moist Karraclenz gauze    Manual Therapy manual lymph drainage for Rt LE.   manual techniques including supraclavicular, deep and superficial abdominal, Rt inguinal axillary anatomosis both anterior and posterior followed by cleansing, moisturizing and multi layer compression dressings using 1/2" foam and short stretch bandages.     Decrease Necrotic Tissue to 0   Decrease Necrotic Tissue - Progress Progressing toward goal   Increase Granulation Tissue to 100   Increase Granulation Tissue - Progress Progressing toward goal   Decrease Length/Width/Depth by (cm) wound to decrease by 1.5x1.5.1.5   Decrease Length/Width/Depth - Progress Progressing toward goal   Improve Drainage Characteristics Other (comment)  none   Patient/Family will be able to  Pt to demonstrate full time wearing of juxtalite   Patient/Family Instruction Goal - Progress Met            Plan - 01/21/15 1503    Clinical Impression Statement Wound edges continue to be epiboled.  Debridement concentrated on de-epiboliziation.  Pt volume remeasured and is increased by 245 cc from initial measurement, (most likely secondary to standing at the mall over the weekend).  Dressing changed to wet to moist using 2x2 and hydrogel.  LE badaged with foam and short stretch bandages following manual lymph massage.   PT Next Visit Plan continue with debridement and lymph decongestion techniques.          Problem List Patient Active Problem List   Diagnosis Date Noted  . Chronic anticoagulation 11/14/2014  . 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 Leg wound, left 08/19/2011  . DVT (deep venous thrombosis) 06/15/2011  . H/O PE (pulmonary embolism) 06/15/2011  . 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 [place], [place], [address] Phone: [phone]   Fax:  [phone]

## 2015-01-23 ENCOUNTER — Ambulatory Visit (HOSPITAL_COMMUNITY): Payer: Medicare Other | Admitting: Physical Therapy

## 2015-01-25 ENCOUNTER — Ambulatory Visit (HOSPITAL_COMMUNITY): Payer: Medicare Other | Admitting: Physical Therapy

## 2015-01-25 DIAGNOSIS — S91001D Unspecified open wound, right ankle, subsequent encounter: Secondary | ICD-10-CM | POA: Diagnosis not present

## 2015-01-25 DIAGNOSIS — Z86711 Personal history of pulmonary embolism: Secondary | ICD-10-CM | POA: Diagnosis not present

## 2015-01-25 DIAGNOSIS — S81801D Unspecified open wound, right lower leg, subsequent encounter: Principal | ICD-10-CM

## 2015-01-25 DIAGNOSIS — Z86718 Personal history of other venous thrombosis and embolism: Secondary | ICD-10-CM | POA: Diagnosis not present

## 2015-01-25 DIAGNOSIS — S81002D Unspecified open wound, left knee, subsequent encounter: Secondary | ICD-10-CM

## 2015-01-25 DIAGNOSIS — S81001D Unspecified open wound, right knee, subsequent encounter: Secondary | ICD-10-CM | POA: Diagnosis not present

## 2015-01-25 DIAGNOSIS — S91002D Unspecified open wound, left ankle, subsequent encounter: Secondary | ICD-10-CM

## 2015-01-25 DIAGNOSIS — S81802D Unspecified open wound, left lower leg, subsequent encounter: Secondary | ICD-10-CM

## 2015-01-25 NOTE — Therapy (Signed)
[place], [place], [address] Phone: [phone]   Fax:  [phone]  Wound Care Therapy  Patient Details  Name: Matthew Wise MRN: [id number] Date of Birth: 05/14/1961 Referring Provider:  Baird Cancer, PA-C  Encounter Date: 01/25/2015      PT End of Session - 01/25/15 1703    Visit Number 33   Number of Visits 40   Date for PT Re-Evaluation 02/10/15   Authorization - Visit Number 38   Authorization - Number of Visits 40   PT Start Time 6256   PT Stop Time 1500   PT Time Calculation (min) 63 min      Past Medical History  Diagnosis Date  . DVT of leg (deep venous thrombosis)     rt. leg  . Inferior vena caval thrombosis     Chronic  . DVT (deep venous thrombosis) 06/15/2011  . H/O PE (pulmonary embolism) 06/15/2011  . 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 Chronic anticoagulation 11/14/2014    No past surgical history on file.  There were no vitals taken for this visit.  Visit Diagnosis:  Open wound of knee, leg (except thigh), and ankle, complicated, right, subsequent encounter  Open wound of knee, leg (except thigh), and ankle, complicated, left, subsequent encounter           Wound Therapy - 01/25/15 1700    Subjective Pt has no pain.   Pain Score 0-No pain   Wound Properties Date First Assessed: 10/16/14 Time First Assessed: 1045 Wound Type: Other (Comment) Location: Leg Location Orientation: Right;Anterior Wound Description (Comments): Mid anterior Right Lower leg Present on Admission: No   Dressing Type Gauze (Comment)  wet to moist using Carraklenz   Site / Wound Assessment Granulation tissue;Red;Yellow   % Wound base Red or Granulating 90%   % Wound base Yellow 10%   Peri-wound Assessment Intact   Wound Length (cm) 1.8 cm   Wound Width (cm) 1.5 cm   Wound Depth (cm) 0.5 cm   Margins  Epibole (rolled edges)   Drainage Amount Minimal   Drainage Description Serous   Treatment Cleansed;Debridement (Selective)   Selective Debridement - Location entire wound bed.   Selective Debridement - Tools Used Forceps;Scalpel;Scissors   Selective Debridement - Tissue Removed slough   Wound Therapy - Clinical Statement Wound edges Epiboled again used scapel and forceps to pull skin away with success.    Factors Delaying/Impairing Wound Healing Altered sensation;Diabetes Mellitus;Infection - systemic/local   Hydrotherapy Plan Debridement;Dressing change;Patient/family education;Other (comment)  manual decongestive techniques.   Wound Plan Continue MLD followed by woundcare and compression bandaging.   Dressing  wet to moist Karraclenz gauze    Manual Therapy manual lymph drainage for Rt LE.   manual techniques including supraclavicular, deep and superficial abdominal, Rt inguinal axillary anatomosis both anterior and posterior followed by cleansing, moisturizing and multi layer compression dressings using 1/2" foam and short stretch bandages.    Decrease Necrotic Tissue to 0   Decrease Necrotic Tissue - Progress Progressing toward goal   Increase Granulation Tissue to 100   Increase Granulation Tissue - Progress Progressing toward goal   Decrease Length/Width/Depth by (cm) wound to decrease by 1.5x1.5.1.5   Decrease Length/Width/Depth - Progress Progressing toward goal   Improve Drainage Characteristics Other (comment)  none   Patient/Family will be able to  Pt to demonstrate full time  wearing of juxtalite   Patient/Family Instruction Goal - Progress Met                  Plan - 01/25/15 1703    Clinical Impression Statement Pt continues to need debridement to wound.  Once debrided pt appears to have good granulation but biofilm builds up and is present at next visit.  Therapist used Carraklenz moistened 2x2 on wound bed.    PT Next Visit Plan If biofilm is present change to  silver dressing; continue with total decongestive techniques.         Problem List Patient Active Problem List   Diagnosis Date Noted  . Chronic anticoagulation 11/14/2014  . 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 Leg wound, left 08/19/2011  . DVT (deep venous thrombosis) 06/15/2011  . H/O PE (pulmonary embolism) 06/15/2011  . 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 [place], [place], [address] Phone: [phone]   Fax:  [phone]

## 2015-02-03 ENCOUNTER — Encounter (HOSPITAL_COMMUNITY): Payer: Self-pay | Admitting: Emergency Medicine

## 2015-02-03 ENCOUNTER — Inpatient Hospital Stay (HOSPITAL_COMMUNITY)
Admission: EM | Admit: 2015-02-03 | Discharge: 2015-02-05 | DRG: 872 | Disposition: A | Discharge status: Home / Self Care | Payer: Medicare Other | Attending: Internal Medicine | Admitting: Internal Medicine

## 2015-02-03 DIAGNOSIS — I878 Other specified disorders of veins: Secondary | ICD-10-CM | POA: Diagnosis present

## 2015-02-03 DIAGNOSIS — L97819 Non-pressure chronic ulcer of other part of right lower leg with unspecified severity: Secondary | ICD-10-CM | POA: Diagnosis present

## 2015-02-03 DIAGNOSIS — A419 Sepsis, unspecified organism: Secondary | ICD-10-CM | POA: Diagnosis present

## 2015-02-03 DIAGNOSIS — Z7901 Long term (current) use of anticoagulants: Secondary | ICD-10-CM

## 2015-02-03 DIAGNOSIS — N189 Chronic kidney disease, unspecified: Secondary | ICD-10-CM | POA: Diagnosis present

## 2015-02-03 DIAGNOSIS — I83009 Varicose veins of unspecified lower extremity with ulcer of unspecified site: Secondary | ICD-10-CM | POA: Diagnosis present

## 2015-02-03 DIAGNOSIS — Z86711 Personal history of pulmonary embolism: Secondary | ICD-10-CM

## 2015-02-03 DIAGNOSIS — F17210 Nicotine dependence, cigarettes, uncomplicated: Secondary | ICD-10-CM | POA: Diagnosis present

## 2015-02-03 DIAGNOSIS — L03115 Cellulitis of right lower limb: Secondary | ICD-10-CM

## 2015-02-03 DIAGNOSIS — Z86718 Personal history of other venous thrombosis and embolism: Secondary | ICD-10-CM

## 2015-02-03 DIAGNOSIS — H01009 Unspecified blepharitis unspecified eye, unspecified eyelid: Secondary | ICD-10-CM | POA: Diagnosis present

## 2015-02-03 DIAGNOSIS — I82403 Acute embolism and thrombosis of unspecified deep veins of lower extremity, bilateral: Secondary | ICD-10-CM | POA: Diagnosis not present

## 2015-02-03 DIAGNOSIS — F1010 Alcohol abuse, uncomplicated: Secondary | ICD-10-CM | POA: Diagnosis present

## 2015-02-03 DIAGNOSIS — I82409 Acute embolism and thrombosis of unspecified deep veins of unspecified lower extremity: Secondary | ICD-10-CM | POA: Diagnosis not present

## 2015-02-03 DIAGNOSIS — N390 Urinary tract infection, site not specified: Secondary | ICD-10-CM | POA: Diagnosis present

## 2015-02-03 DIAGNOSIS — E876 Hypokalemia: Secondary | ICD-10-CM | POA: Diagnosis present

## 2015-02-03 DIAGNOSIS — I82413 Acute embolism and thrombosis of femoral vein, bilateral: Secondary | ICD-10-CM | POA: Diagnosis not present

## 2015-02-03 LAB — COMPREHENSIVE METABOLIC PANEL
ALT: 25 U/L (ref 0–53)
ANION GAP: 7 (ref 5–15)
AST: 26 U/L (ref 0–37)
Albumin: 3.5 g/dL (ref 3.5–5.2)
Alkaline Phosphatase: 73 U/L (ref 39–117)
BUN: 14 mg/dL (ref 6–23)
CALCIUM: 8.9 mg/dL (ref 8.4–10.5)
CHLORIDE: 96 mmol/L (ref 96–112)
CO2: 31 mmol/L (ref 19–32)
Creatinine, Ser: 1.58 mg/dL — ABNORMAL HIGH (ref 0.50–1.35)
GFR calc non Af Amer: 49 mL/min — ABNORMAL LOW (ref >90)
GFR, EST AFRICAN AMERICAN: 56 mL/min — AB (ref >90)
GLUCOSE: 114 mg/dL — AB (ref 70–99)
Potassium: 3.2 mmol/L — ABNORMAL LOW (ref 3.5–5.1)
Sodium: 134 mmol/L — ABNORMAL LOW (ref 135–145)
Total Bilirubin: 0.9 mg/dL (ref 0.3–1.2)
Total Protein: 8.7 g/dL — ABNORMAL HIGH (ref 6.0–8.3)

## 2015-02-03 LAB — CBC WITH DIFFERENTIAL/PLATELET
BASOS ABS: 0 10*3/uL (ref 0.0–0.1)
BASOS PCT: 0 % (ref 0–1)
Band Neutrophils: 0 % (ref 0–10)
EOS ABS: 0.1 10*3/uL (ref 0.0–0.7)
Eosinophils Relative: 1 % (ref 0–5)
HEMATOCRIT: 44.5 % (ref 39.0–52.0)
HEMOGLOBIN: 16.2 g/dL (ref 13.0–17.0)
LYMPHS PCT: 13 % (ref 12–46)
Lymphs Abs: 1.7 10*3/uL (ref 0.7–4.0)
MCH: 32.7 pg (ref 26.0–34.0)
MCHC: 36.4 g/dL — AB (ref 30.0–36.0)
MCV: 89.7 fL (ref 78.0–100.0)
Monocytes Absolute: 1.4 10*3/uL — ABNORMAL HIGH (ref 0.1–1.0)
Monocytes Relative: 10 % (ref 3–12)
NEUTROS ABS: 10.3 10*3/uL — AB (ref 1.7–7.7)
NEUTROS PCT: 76 % (ref 43–77)
PLATELETS: 328 10*3/uL (ref 150–400)
RBC: 4.96 MIL/uL (ref 4.22–5.81)
RDW: 13.6 % (ref 11.5–15.5)
WBC: 13.6 10*3/uL — AB (ref 4.0–10.5)

## 2015-02-03 MED ORDER — THIAMINE HCL 100 MG/ML IJ SOLN
Freq: Once | INTRAVENOUS | Status: AC
  Administered 2015-02-03: 23:00:00 via INTRAVENOUS
  Filled 2015-02-03: qty 1000

## 2015-02-03 MED ORDER — PIPERACILLIN-TAZOBACTAM 3.375 G IVPB: 3.3750 g | Freq: Three times a day (TID) | INTRAVENOUS | Status: DC

## 2015-02-03 MED ORDER — PIPERACILLIN-TAZOBACTAM 3.375 G IVPB 30 MIN
3.3750 g | Freq: Once | INTRAVENOUS | Status: AC
  Administered 2015-02-03: 3.375 g via INTRAVENOUS
  Filled 2015-02-03: qty 50

## 2015-02-03 MED ORDER — RIVAROXABAN 20 MG PO TABS
20.0000 mg | ORAL_TABLET | Freq: Every day | ORAL | Status: DC
  Administered 2015-02-04: 20 mg via ORAL
  Filled 2015-02-03 (×2): qty 1

## 2015-02-03 MED ORDER — M.V.I. ADULT IV INJ
INJECTION | INTRAVENOUS | Status: AC
  Filled 2015-02-03: qty 10

## 2015-02-03 MED ORDER — POTASSIUM CHLORIDE IN NACL 20-0.9 MEQ/L-% IV SOLN
INTRAVENOUS | Status: DC
  Administered 2015-02-04 (×2): via INTRAVENOUS

## 2015-02-03 MED ORDER — ONDANSETRON HCL 4 MG/2ML IJ SOLN: 4.0000 mg | Freq: Four times a day (QID) | INTRAMUSCULAR | Status: DC | PRN

## 2015-02-03 MED ORDER — ACETAMINOPHEN 325 MG PO TABS
650.0000 mg | ORAL_TABLET | Freq: Once | ORAL | Status: AC
  Administered 2015-02-03: 650 mg via ORAL
  Filled 2015-02-03: qty 2

## 2015-02-03 MED ORDER — VANCOMYCIN HCL IN DEXTROSE 1-5 GM/200ML-% IV SOLN
1000.0000 mg | Freq: Once | INTRAVENOUS | Status: AC
  Administered 2015-02-03: 1000 mg via INTRAVENOUS
  Filled 2015-02-03: qty 200

## 2015-02-03 MED ORDER — ONDANSETRON HCL 4 MG PO TABS: 4.0000 mg | ORAL_TABLET | Freq: Four times a day (QID) | ORAL | Status: DC | PRN

## 2015-02-03 MED ORDER — VANCOMYCIN HCL IN DEXTROSE 1-5 GM/200ML-% IV SOLN: 1000.0000 mg | Freq: Two times a day (BID) | INTRAVENOUS | Status: DC

## 2015-02-03 MED ORDER — CIPROFLOXACIN IN D5W 400 MG/200ML IV SOLN
400.0000 mg | Freq: Once | INTRAVENOUS | Status: AC
  Administered 2015-02-03: 400 mg via INTRAVENOUS
  Filled 2015-02-03: qty 200

## 2015-02-03 MED ORDER — THIAMINE HCL 100 MG/ML IJ SOLN
INTRAMUSCULAR | Status: AC
  Filled 2015-02-03: qty 2

## 2015-02-03 MED ORDER — FOLIC ACID 5 MG/ML IJ SOLN
INTRAMUSCULAR | Status: AC
  Filled 2015-02-03: qty 0.2

## 2015-02-03 NOTE — ED Notes (Signed)
Patient and family member provided drink at this time. No other needs voiced.

## 2015-02-03 NOTE — Progress Notes (Signed)
ANTIBIOTIC CONSULT NOTE - INITIAL  Pharmacy Consult for vancomycin & Zosyn Indication: cellulitis of right lower leg  No Known Allergies  Patient Measurements: Height: 6' (182.9 cm) Weight: 275 lb (124.739 kg) IBW/kg (Calculated) : 77.6 Adjusted Body Weight: 93 kg  Vital Signs: Temp: 101.8 F (38.8 C) (03/06 2113) Temp Source: Oral (03/06 2113) BP: 131/74 mmHg (03/06 2113) Pulse Rate: 91 (03/06 2113) Intake/Output from previous day:   Intake/Output from this shift:    Labs:  Recent Labs  02/03/15 1850  WBC 13.6*  HGB 16.2  PLT 328  CREATININE 1.58*   Estimated Creatinine Clearance: 74.6 mL/min (by C-G formula based on Cr of 1.58). No results for input(s): VANCOTROUGH, VANCOPEAK, VANCORANDOM, GENTTROUGH, GENTPEAK, GENTRANDOM, TOBRATROUGH, TOBRAPEAK, TOBRARND, AMIKACINPEAK, AMIKACINTROU, AMIKACIN in the last 72 hours.   Microbiology: No results found for this or any previous visit (from the past 720 hour(s)).  Medical History: Past Medical History  Diagnosis Date  . DVT of leg (deep venous thrombosis)     rt. leg  . Inferior vena caval thrombosis     Chronic  . DVT (deep venous thrombosis) 06/15/2011  . H/O PE (pulmonary embolism) 06/15/2011  . 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 Chronic anticoagulation 11/14/2014    Medications:  Scheduled:  . [START ON 02/04/2015] rivaroxaban  20 mg Oral Q supper   Infusions:  . 0.9 % NaCl with KCl 20 mEq / L    . piperacillin-tazobactam    . banana bag IV 1000 mL    . vancomycin     PRN: ondansetron **OR** ondansetron (ZOFRAN) IV   Assessment: 30yr large male with hx of PE/chronic DVT (wears compression device on right lower leg - previously non-compliant on oral anticoagulation) - now with dx of possible cellulitis of right lower leg to be treated with vanc/Zosyn.  Patient also received 1 dose IV Cipro in ED (possibly for  original complaint of UTI).  Goal of Therapy:  With CrCl>86ml/min, will utilize standard Zosyn regimen.  Desire vancomycin serum troughs to be 12-34mcg/ml.  Plan:  1.  Zosyn 3.375gm IV q8h (4hr infusions) - already had 1 dose ordered in ED 2.  Vancomycin 2gm IV total loading dose (already ordered in ED), then 3  Vancomycin 1gm IV q12h 4.  Monitor for indices of infection and renal function 5.  Measure actual vancomycin trough level as clinically indicated  Danie Binder E 02/03/2015,10:50 PM

## 2015-02-03 NOTE — H&P (Signed)
Hospitalist Admission History and Physical  Patient name: Matthew Wise Medical record number: [id number] Date of birth: 1960-12-01 Age: 54 y.o. Gender: male  Primary Care Provider: [name], MD  Chief Complaint: sepsis, UTI, cellulitis   History of Present Illness:This is a 54 y.o. 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 Pt states that he has had 1 week of increased urinary frequency and urgency. No fever, no abd pain. Denies any frank hematuria in setting of xarelto use. Pt also reports distal RLE pain and swelling. Has chronic venous stasis ulcers, also chronic ulceration followed by wound care. ? Mild drainage and pain. Has been compliant w/ xarelto.  Presents to ER T 101.8, HR 90s, resp 20s, BP 110s-130s, satting 97% on RA. WBC 13.6, hgb 16.2, Cr 1.58. UA concerning for UTI (formal results pending).  Assessment and Plan: Matthew Wise is a 54 y.o. year old male presenting with sepsis, UTI, cellulitis    Active Problems:   UTI (lower urinary tract infection)   Sepsis   Cellulitis   Blepharitis   1- Sepsis -multifactorial in setting of UTI and cellulitis -vanc and zosyn  -blood and urine culture -lactate x 1 -follow   2-UTI  -on vanc and zosyn for sepsis -urine culture -follow   3-Cellulitis -acute on chronic issue in setting of venous stasis ulceration  -IV vanc and zosyn  -blood cultures -ulcerations w/o active drainage  -follow  -consider ortho consult if minimal improvement   4-CKD -Cr 1.58 on admission  -baseline Cr 1.14-1.4 -follow   5-Blepharitis  -noted blepharitis bilaterally  -do not suspect septic emboli at present -topical warm compresses -reassess in am  -consider addition of op azithromycin if no signficant improvement over next 24 hours   6-ETOH abuse -banana bag x 1 -ammonia  level  -CIWA protocol   7-hx/o DVT/PE failed coumadin -on xarelto  -tobacco significant recurrence risk-discussed this with pt  -recheck LE u/s   FEN/GI: heart healthy diet  Prophylaxis: xarelto  Disposition: pending further evaluation  Code Status:Full Code    Patient Active Problem List   Diagnosis Date Noted  . UTI (lower urinary tract infection) 02/03/2015  . Sepsis 02/03/2015  . Cellulitis 02/03/2015  . Chronic anticoagulation 11/14/2014  . 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 Leg wound, left 08/19/2011  . DVT (deep venous thrombosis) 06/15/2011  . H/O PE (pulmonary embolism) 06/15/2011  . 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 DVT of leg (deep venous thrombosis)     rt. leg  . Inferior vena caval thrombosis     Chronic  . DVT (deep venous thrombosis) 06/15/2011  . H/O PE (pulmonary embolism) 06/15/2011  . 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 Chronic anticoagulation 11/14/2014    Past Surgical History: History reviewed. No pertinent past surgical history.  Social History: History   Social History  . Marital Status: Divorced    Spouse Name: N/A  . Number of Children: N/A  . Years of Education: N/A   Social History Main Topics  . Smoking status: Current Every Day Smoker -- 1.00 packs/day for 30 years    Types: Cigarettes  . Smokeless tobacco: Never  Used  . Alcohol Use: 3.0 oz/week    5 Cans of beer per week  . Drug Use: No     Comment: REMOTE HISTORY, Quit in 2007  . Sexual Activity: Not on file   Other Topics Concern  . None   Social History Narrative    Family History: Family History  Problem Relation Age of Onset  . COPD Father   . Stroke Brother   . Cancer Brother     Allergies: No  Known Allergies  Current Facility-Administered Medications  Medication Dose Route Frequency Provider Last Rate Last Dose  . 0.9 % NaCl with KCl 20 mEq/ L  infusion   Intravenous Continuous Shanda Howells, MD      . ondansetron [facility]) tablet 4 mg  4 mg Oral Q6H PRN Shanda Howells, MD       Or  . ondansetron [facility]) injection 4 mg  4 mg Intravenous Q6H PRN Shanda Howells, MD      . piperacillin-tazobactam (ZOSYN) IVPB 3.375 g  3.375 g Intravenous Once Shanda Howells, MD      . Derrill Memo ON 02/04/2015] rivaroxaban (XARELTO) tablet 20 mg  20 mg Oral Q supper Shanda Howells, MD      . sodium chloride 0.9 % 1,000 mL with thiamine 962 mg, folic acid 1 mg, multivitamins adult 10 mL infusion   Intravenous Once Shanda Howells, MD      . vancomycin (VANCOCIN) IVPB 1000 mg/200 mL premix  1,000 mg Intravenous Once Shanda Howells, MD       Current Outpatient Prescriptions  Medication Sig Dispense Refill  . oxycodone (OXY-IR) 5 MG capsule Take 1 capsule (5 mg total) by mouth every 4 (four) hours as needed for pain. 45 capsule 0  . rivaroxaban (XARELTO) 20 MG TABS tablet Take 1 tablet (20 mg total) by mouth daily with supper. 30 tablet 5  . naproxen (NAPROSYN) 500 MG tablet Take 1 tablet (500 mg total) by mouth 2 (two) times daily with a meal. (Patient not taking: Reported on 02/03/2015) 60 tablet 2   Review Of Systems: 12 point ROS negative except as noted above in HPI.  Physical Exam: Filed Vitals:   02/03/15 2113  BP: 131/74  Pulse: 91  Temp: 101.8 F (38.8 C)  Resp: 20    General: alert and cooperative HEENT: PERRLA and bilateral blepharitis  Heart: S1, S2 normal, no murmur, rub or gallop, regular rate and rhythm Lungs: clear to auscultation, no wheezes or rales and unlabored breathing Abdomen: abdomen is soft without significant tenderness, masses, organomegaly or guarding Extremities: venous stasis dermatitis noted and stable crusted lesions on LE, + bilateral LE swelling R>L, + TTP on RLE distally,  distal pulses intact  Skin:as above  Neurology: normal without focal findings  Labs and Imaging: Lab Results  Component Value Date/Time   NA 134* 02/03/2015 06:50 PM   K 3.2* 02/03/2015 06:50 PM   CL 96 02/03/2015 06:50 PM   CO2 31 02/03/2015 06:50 PM   BUN 14 02/03/2015 06:50 PM   CREATININE 1.58* 02/03/2015 06:50 PM   GLUCOSE 114* 02/03/2015 06:50 PM   Lab Results  Component Value Date   WBC 13.6* 02/03/2015   HGB 16.2 02/03/2015   HCT 44.5 02/03/2015   MCV 89.7 02/03/2015   PLT 328 02/03/2015        No results found.         Shanda Howells MD  Pager: [phone] ]

## 2015-02-03 NOTE — Progress Notes (Addendum)
Noank for vancomycin & Zosyn Indication: cellulitis of right lower leg  No Known Allergies  Patient Measurements: Height: 6' (182.9 cm) Weight: 275 lb (124.739 kg) IBW/kg (Calculated) : 77.6  Vital Signs: Temp: 99.7 F (37.6 C) (03/06 2251) Temp Source: Oral (03/06 2251) BP: 121/67 mmHg (03/06 2251) Pulse Rate: 67 (03/06 2251) Intake/Output from previous day:   Intake/Output from this shift:    Labs:  Recent Labs  02/03/15 1850  WBC 13.6*  HGB 16.2  PLT 328  CREATININE 1.58*   Estimated Creatinine Clearance: 74.6 mL/min (by C-G formula based on Cr of 1.58). No results for input(s): VANCOTROUGH, VANCOPEAK, VANCORANDOM, GENTTROUGH, GENTPEAK, GENTRANDOM, TOBRATROUGH, TOBRAPEAK, TOBRARND, AMIKACINPEAK, AMIKACINTROU, AMIKACIN in the last 72 hours.   Microbiology: No results found for this or any previous visit (from the past 720 hour(s)).  Medical History: Past Medical History  Diagnosis Date  . DVT of leg (deep venous thrombosis)     rt. leg  . Inferior vena caval thrombosis     Chronic  . DVT (deep venous thrombosis) 06/15/2011  . H/O PE (pulmonary embolism) 06/15/2011  . 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 Chronic anticoagulation 11/14/2014    Medications:  Scheduled:  . [START ON 02/04/2015] rivaroxaban  20 mg Oral Q supper   Infusions:  . 0.9 % NaCl with KCl 20 mEq / L    . piperacillin-tazobactam    . banana bag IV 1000 mL    . vancomycin     PRN: ondansetron **OR** ondansetron (ZOFRAN) IV   Assessment: 5yr obese male with hx of PE/chronic DVT (wears compression device on right lower leg - previously non-compliant on oral anticoagulation) - now with dx of possible cellulitis of right lower leg to be treated with vanc/Zosyn.  Patient also received 1 dose IV Cipro in ED (possibly for original complaint of UTI). He is febrile (Tm  101.8) with elevated WBC. NCrCl ~ 70ml/min Initial doses ordered in ED  Goal of Therapy:  Vancomycin trough 10-25mcg/ml  Plan:  Zosyn 3.375gm IV Q8h to be infused over 4hrs Vancomycin 2gm IV total loading dose (already ordered in ED), Vancomycin 1500mg  IV q24h per nomogram for patients >100kg Check Vancomycin trough at steady state Monitor renal function and cx data   Biagio Borg 02/03/2015,11:06 PM  Renal function improved with hydration this morning. NCrCl ~ 11ml/min. Increase Vancomycin 1gm IV q12h  Netta Cedars, PharmD, BCPS 02/04/2015  @8 :22 AM

## 2015-02-03 NOTE — ED Notes (Signed)
Pt c/o of frequent urination and "symptoms similar to my last kidney infection" for "about a week." Pt also c/o "chills." Pt c/o fatigue and weakness. Pt c/o of right leg pain/swelling and states "I have a hx of blood clots." Pt denies any other illness or injury. Pt A&O and in NAD at this time. -Lurline Idol, RN

## 2015-02-03 NOTE — ED Provider Notes (Addendum)
CSN: [id number]     Arrival date & time 02/03/15  1749 History  This chart was scribed for Maudry Diego, MD by Edison Simon, ED Scribe. This patient was seen in room APA19/APA19 and the patient's care was started at [place].    Chief Complaint  Patient presents with  . Urinary Frequency   Patient is a 54 y.o. male presenting with frequency. The history is provided by the patient. No language interpreter was used.  Urinary Frequency This is a recurrent problem. The current episode started more than 2 days ago. The problem occurs constantly. The problem has not changed since onset.Pertinent negatives include no chest pain, no abdominal pain and no headaches. Nothing aggravates the symptoms. Nothing relieves the symptoms. He has tried nothing for the symptoms.    HPI Comments: Matthew Wise is a 54 y.o. male who presents to the Emergency Department complaining of frequent urination with onset 1 week ago. He reports associated fever, chills, diaphoresis, fatigue, and generalized weakness. He states symptoms are similar to prior kidney infection a few months ago. He reports pain and swelling to right leg; he has problems with blood clots and wears a brace for compression. He uses Xarelto. He denies pain elsewhere.  Past Medical History  Diagnosis Date  . DVT of leg (deep venous thrombosis)     rt. leg  . Inferior vena caval thrombosis     Chronic  . DVT (deep venous thrombosis) 06/15/2011  . H/O PE (pulmonary embolism) 06/15/2011  . 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 Chronic anticoagulation 11/14/2014   History reviewed. No pertinent past surgical history. Family History  Problem Relation Age of Onset  . COPD Father   . Stroke Brother   . Cancer Brother    History  Substance Use Topics  . Smoking status: Current Every Day Smoker -- 1.00 packs/day for 30 years    Types: Cigarettes  . Smokeless  tobacco: Never Used  . Alcohol Use: 3.0 oz/week    5 Cans of beer per week    Review of Systems  Constitutional: Positive for fever, chills, diaphoresis and fatigue. Negative for appetite change.  HENT: Negative for congestion, ear discharge and sinus pressure.   Eyes: Negative for discharge.  Respiratory: Negative for cough.   Cardiovascular: Positive for leg swelling. Negative for chest pain.  Gastrointestinal: Negative for abdominal pain and diarrhea.  Genitourinary: Positive for frequency. Negative for dysuria, hematuria and flank pain.  Musculoskeletal: Negative for back pain.  Skin: Negative for rash.  Neurological: Positive for weakness. Negative for seizures and headaches.  Psychiatric/Behavioral: Negative for hallucinations.      Allergies  Review of patient's allergies indicates no known allergies.  Home Medications   Prior to Admission medications   Medication Sig Start Date End Date Taking? Authorizing Provider  naproxen (NAPROSYN) 500 MG tablet Take 1 tablet (500 mg total) by mouth 2 (two) times daily with a meal. 10/02/13   Baird Cancer, PA-C  oxycodone (OXY-IR) 5 MG capsule Take 1 capsule (5 mg total) by mouth every 4 (four) hours as needed for pain. 01/15/15   Baird Cancer, PA-C  rivaroxaban (XARELTO) 20 MG TABS tablet Take 1 tablet (20 mg total) by mouth daily with supper. 11/14/14   Baird Cancer, PA-C   BP 113/74 mmHg  Pulse 99  Temp(Src) 99.7 F (37.6 C) (Oral)  Resp 20  Ht 6' (  1.829 m)  Wt 275 lb (124.739 kg)  BMI 37.29 kg/m2  SpO2 99% Physical Exam  Constitutional: He is oriented to person, place, and time. He appears well-developed.  HENT:  Head: Normocephalic.  Eyes: Conjunctivae and EOM are normal. No scleral icterus.  Neck: Neck supple. No thyromegaly present.  Cardiovascular: Normal rate and regular rhythm.  Exam reveals no gallop and no friction rub.   No murmur heard. Pulmonary/Chest: No stridor. He has no wheezes. He has no  rales. He exhibits no tenderness.  Abdominal: He exhibits no distension. There is no tenderness. There is no rebound.  Musculoskeletal: Normal range of motion. He exhibits no edema.  Compression brace on right leg.  After brace removed pt has swelling and redness and tender left calf.  Appears like a cellulitis  Lymphadenopathy:    He has no cervical adenopathy.  Neurological: He is oriented to person, place, and time. He exhibits normal muscle tone. Coordination normal.  Skin: No rash noted. No erythema.  Psychiatric: He has a normal mood and affect. His behavior is normal.  Nursing note and vitals reviewed.   ED Course  Procedures (including critical care time)  DIAGNOSTIC STUDIES: Oxygen Saturation is 99% on room air, normal by my interpretation.    COORDINATION OF CARE: 6:45 PM Discussed treatment plan with patient at beside, the patient agrees with the plan and has no further questions at this time.   Labs Review Labs Reviewed - No data to display  Imaging Review No results found.   EKG Interpretation None      MDM   Final diagnoses:  None    Cellulitis   Admit,   The chart was scribed for me under my direct supervision.  I personally performed the history, physical, and medical decision making and all procedures in the evaluation of this patient.Maudry Diego, MD 02/03/15 2051  Maudry Diego, MD 02/03/15 2155

## 2015-02-04 ENCOUNTER — Inpatient Hospital Stay (HOSPITAL_COMMUNITY): Payer: Medicare Other

## 2015-02-04 DIAGNOSIS — A419 Sepsis, unspecified organism: Principal | ICD-10-CM

## 2015-02-04 DIAGNOSIS — L03115 Cellulitis of right lower limb: Secondary | ICD-10-CM

## 2015-02-04 DIAGNOSIS — N390 Urinary tract infection, site not specified: Secondary | ICD-10-CM

## 2015-02-04 DIAGNOSIS — I82403 Acute embolism and thrombosis of unspecified deep veins of lower extremity, bilateral: Secondary | ICD-10-CM

## 2015-02-04 LAB — URINALYSIS, ROUTINE W REFLEX MICROSCOPIC
GLUCOSE, UA: NEGATIVE mg/dL
Ketones, ur: NEGATIVE mg/dL
LEUKOCYTES UA: NEGATIVE
NITRITE: NEGATIVE
PH: 6 (ref 5.0–8.0)
PROTEIN: 30 mg/dL — AB
Specific Gravity, Urine: 1.01 (ref 1.005–1.030)

## 2015-02-04 LAB — COMPREHENSIVE METABOLIC PANEL
ALBUMIN: 2.7 g/dL — AB (ref 3.5–5.2)
ALT: 21 U/L (ref 0–53)
AST: 20 U/L (ref 0–37)
Alkaline Phosphatase: 61 U/L (ref 39–117)
Anion gap: 6 (ref 5–15)
BILIRUBIN TOTAL: 0.6 mg/dL (ref 0.3–1.2)
BUN: 12 mg/dL (ref 6–23)
CO2: 30 mmol/L (ref 19–32)
Calcium: 8.4 mg/dL (ref 8.4–10.5)
Chloride: 101 mmol/L (ref 96–112)
Creatinine, Ser: 1.19 mg/dL (ref 0.50–1.35)
GFR calc Af Amer: 80 mL/min — ABNORMAL LOW (ref >90)
GFR, EST NON AFRICAN AMERICAN: 69 mL/min — AB (ref >90)
Glucose, Bld: 114 mg/dL — ABNORMAL HIGH (ref 70–99)
POTASSIUM: 2.9 mmol/L — AB (ref 3.5–5.1)
Sodium: 137 mmol/L (ref 135–145)
TOTAL PROTEIN: 7.1 g/dL (ref 6.0–8.3)

## 2015-02-04 LAB — CBC WITH DIFFERENTIAL/PLATELET
Basophils Absolute: 0 10*3/uL (ref 0.0–0.1)
Basophils Relative: 0 % (ref 0–1)
Eosinophils Absolute: 0.1 10*3/uL (ref 0.0–0.7)
Eosinophils Relative: 1 % (ref 0–5)
HCT: 41.1 % (ref 39.0–52.0)
Hemoglobin: 14.5 g/dL (ref 13.0–17.0)
LYMPHS PCT: 19 % (ref 12–46)
Lymphs Abs: 2 10*3/uL (ref 0.7–4.0)
MCH: 31.5 pg (ref 26.0–34.0)
MCHC: 35.3 g/dL (ref 30.0–36.0)
MCV: 89.3 fL (ref 78.0–100.0)
MONO ABS: 1.4 10*3/uL — AB (ref 0.1–1.0)
MONOS PCT: 13 % — AB (ref 3–12)
NEUTROS ABS: 7.1 10*3/uL (ref 1.7–7.7)
NEUTROS PCT: 67 % (ref 43–77)
PLATELETS: 295 10*3/uL (ref 150–400)
RBC: 4.6 MIL/uL (ref 4.22–5.81)
RDW: 13.6 % (ref 11.5–15.5)
WBC: 10.7 10*3/uL — AB (ref 4.0–10.5)

## 2015-02-04 LAB — URINE MICROSCOPIC-ADD ON

## 2015-02-04 LAB — AMMONIA: Ammonia: 27 umol/L (ref 11–32)

## 2015-02-04 LAB — LACTIC ACID, PLASMA: Lactic Acid, Venous: 0.8 mmol/L (ref 0.5–2.0)

## 2015-02-04 LAB — MAGNESIUM: Magnesium: 2.1 mg/dL (ref 1.5–2.5)

## 2015-02-04 IMAGING — US US EXTREM LOW VENOUS BILAT
1 series · 12 of 24 positions shown · non-contrast
Comparison: LEFT lower extremity [DATE], RIGHT lower extremity
[DATE]

CLINICAL DATA: RIGHT calf pain and swelling for a week, history of
BILATERAL DVT



[Series 1: us extrem low venous bilat · 0.07mm/px · 12 of 119 slices shown]
[im 6/119]
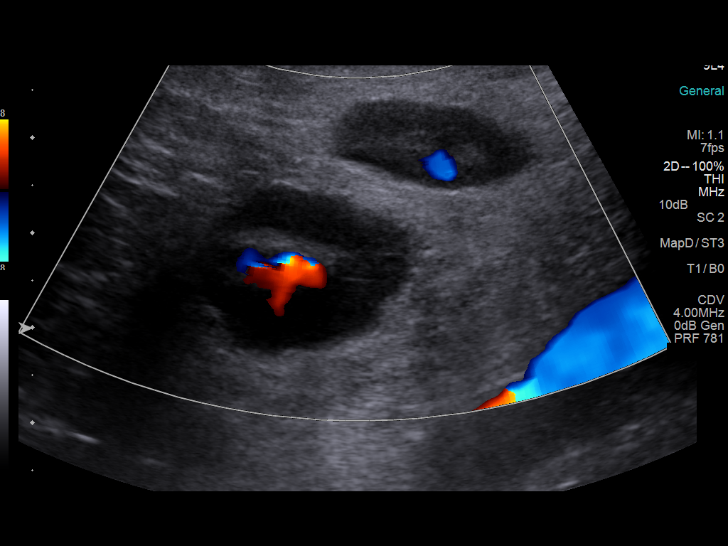
[im 16/119]
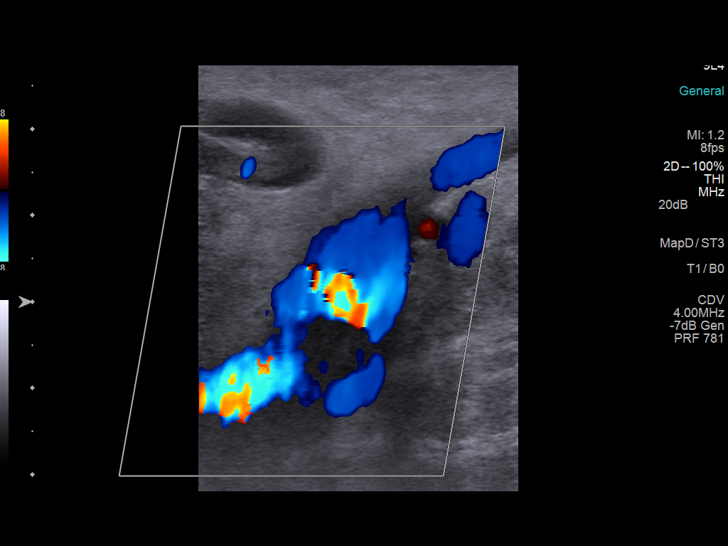
[im 26/119]
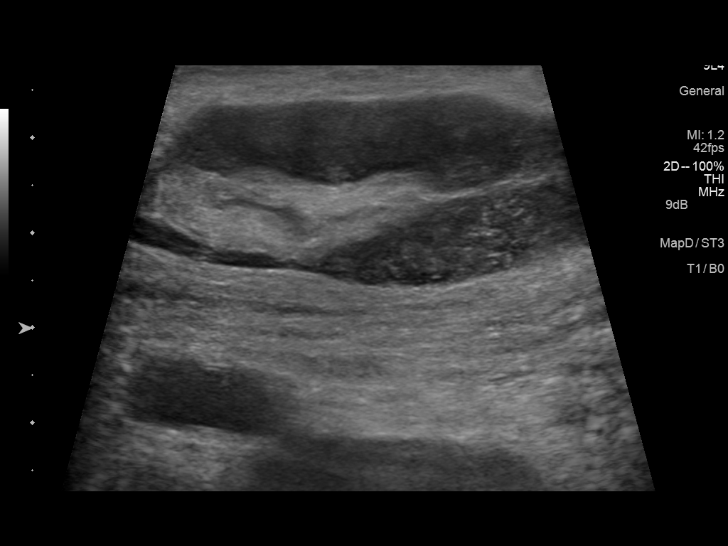
[im 36/119]
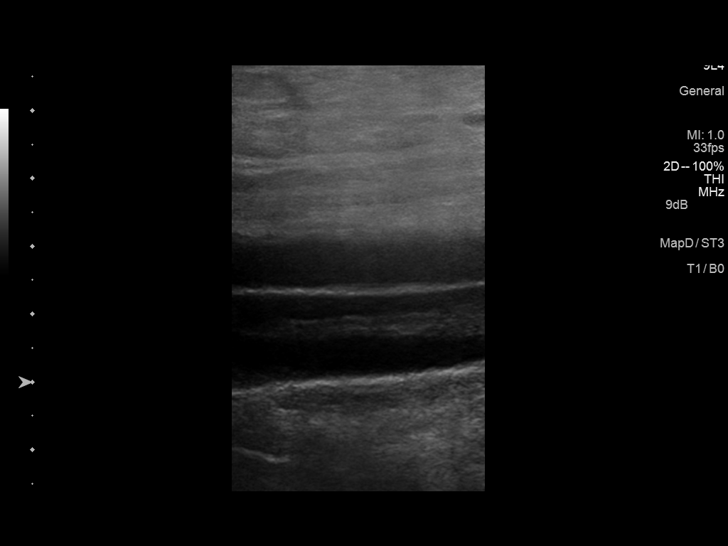
[im 47/119]
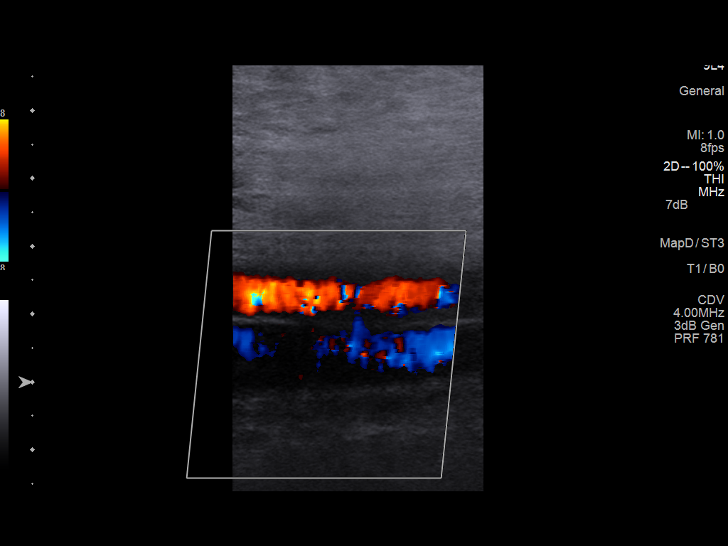
[im 57/119]
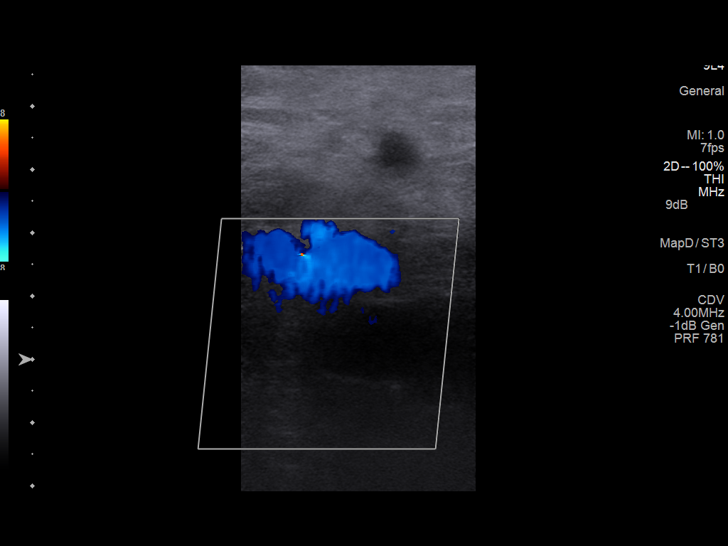
[im 67/119]
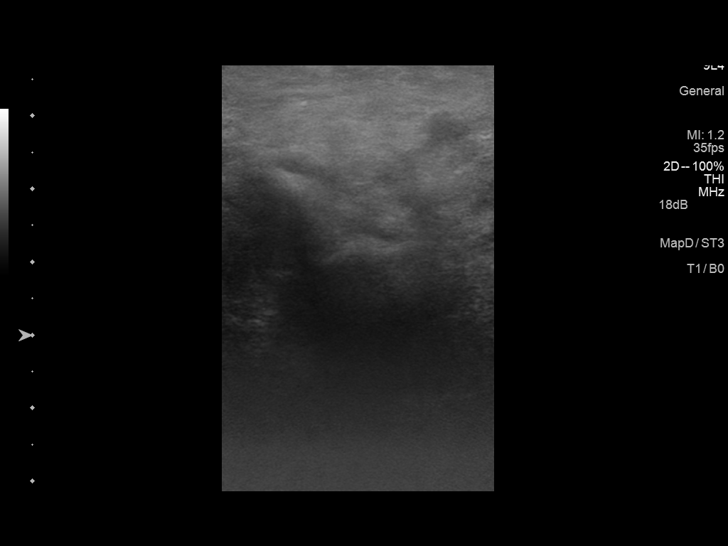
[im 77/119]
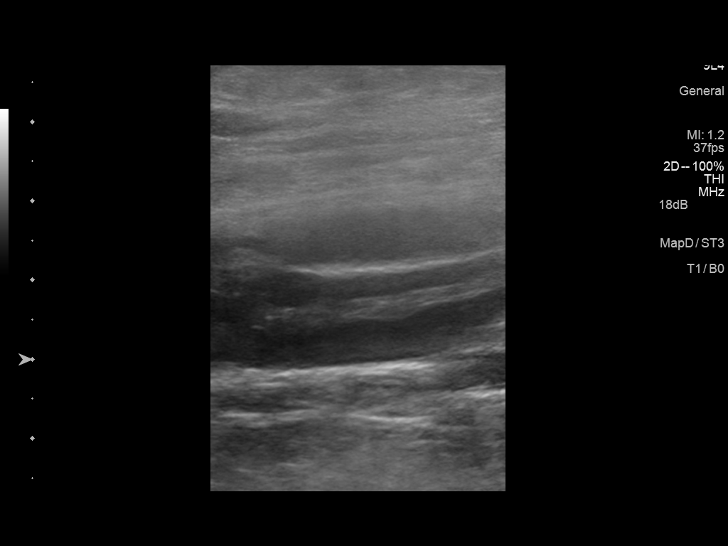
[im 88/119]
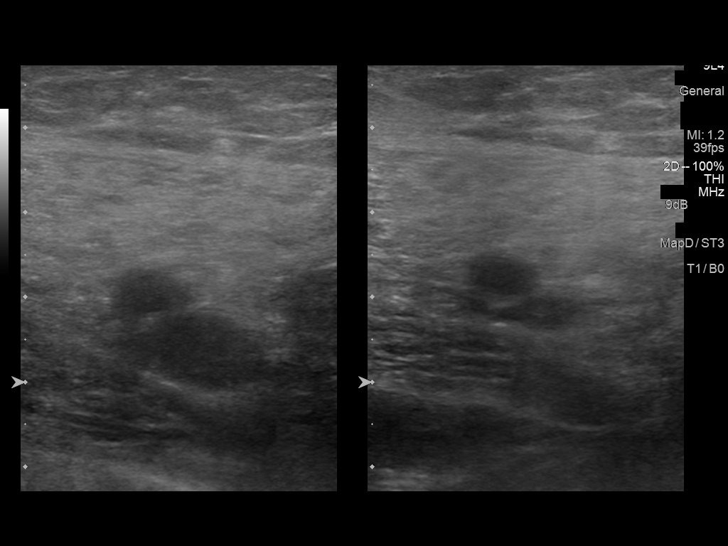
[im 98/119]
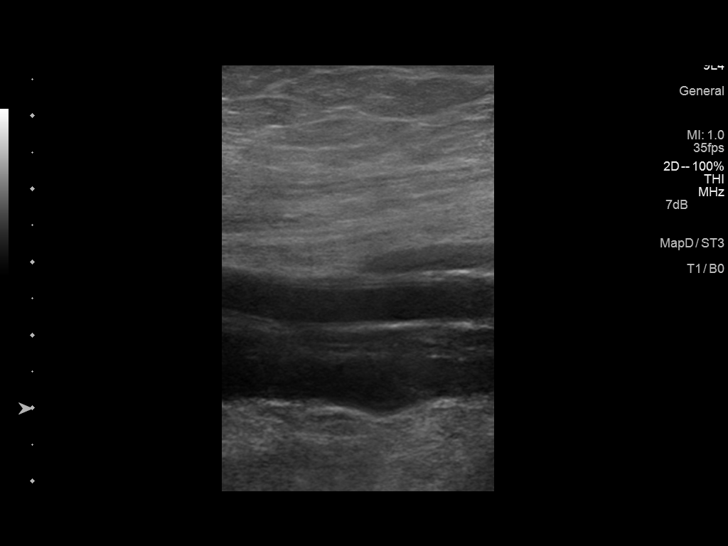
[im 108/119]
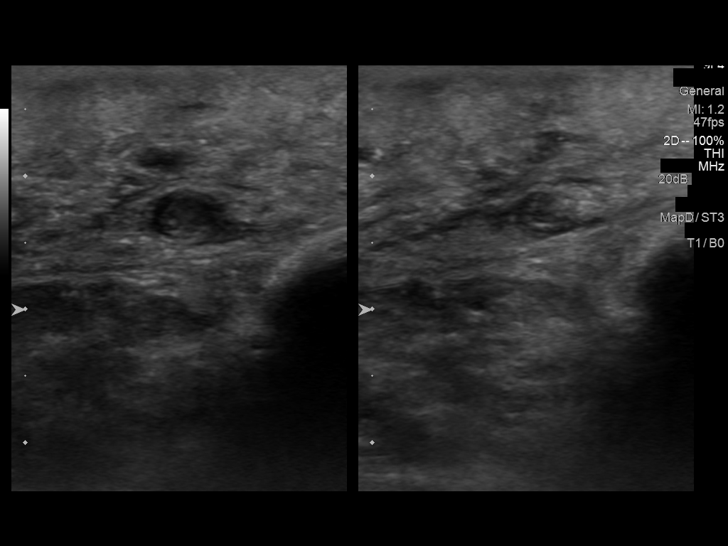
[im 119/119]
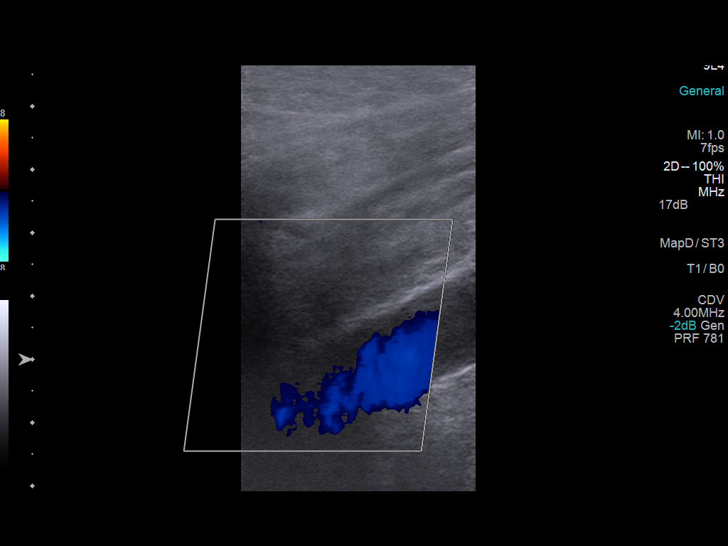

[12 of 24 positions shown; findings below may reference images not displayed]

FINDINGS: RIGHT LOWER EXTREMITY

Common Femoral Vein: Small amount of nonocclusive thrombus.
Spontaneous flow maintained. Vessel incompletely compressible.

Saphenofemoral Junction: Patent and compressible

Profunda Femoral Vein: Nonocclusive thrombus within RIGHT profunda
femoral vein. The vein is incompletely compressible. Spontaneous
flow was present.

Femoral Vein: Nonocclusive thrombus within RIGHT femoral vein at
proximal, mid and distal, nonocclusive, with spontaneous flow
present. Vessel is only partially compressible.

Popliteal Vein: Nonocclusive thrombus within the short segment that
is visualized.

Calf Veins: Inadequately visualized due to dressings.

Superficial Great Saphenous Vein: No evidence of thrombus. Normal
compressibility and flow on color Doppler imaging.

Venous Reflux:  None.

Other Findings: Lymph nodes noted at RIGHT inguinal region, mildly
enlarged up to 19 mm short axis diameter.

LEFT LOWER EXTREMITY

Common Femoral Vein: Small amount of nonocclusive thrombus.
Diminished compressibility. Spontaneous venous flow present.

Saphenofemoral Junction: No evidence of thrombus. Normal
compressibility and flow on color Doppler imaging.

Profunda Femoral Vein: Nonocclusive thrombus present. Spontaneous
flow is present. Diminished phasicity.

Femoral Vein: Nonocclusive thrombus present. Spontaneous flow
present. Diminished phasicity. Incompletely compressible.

Popliteal Vein: Nonocclusive thrombus with diminished flow and
impaired compressibility.

Calf Veins: No evidence of thrombus. Normal compressibility and flow
on color Doppler imaging.

Superficial Great Saphenous Vein: Thrombosed at calf and ankle.

Venous Reflux:  None.

Other Findings:  None.
IMPRESSION: Nonocclusive thrombus is identified within the deep venous systems
of both lower extremities including BILATERAL common femoral,
femoral, profunda femoral, and popliteal veins.

Superficial thrombophlebitis involving the LEFT greater saphenous
vein.

Abnormal mildly enlarged RIGHT inguinal lymph nodes.

When compared to the previous exam, appearance of the thrombosis in
the LEFT lower extremity is little changed, question sequela of
prior DVT.

Thrombus within the RIGHT lower extremity deep venous system was not
present on a prior exam from [78] but acuity is uncertain,
potentially chronic/remote DVT as well ; patient indicates a history
of prior BILATERAL DVT, recommend correlation of the current
findings with history.

Findings called to Dr. BAXICI on [DATE] at [78] hours.

## 2015-02-04 MED ORDER — VANCOMYCIN HCL IN DEXTROSE 1-5 GM/200ML-% IV SOLN
1000.0000 mg | Freq: Two times a day (BID) | INTRAVENOUS | Status: DC
  Administered 2015-02-04 (×2): 1000 mg via INTRAVENOUS
  Filled 2015-02-04 (×5): qty 200

## 2015-02-04 MED ORDER — FOLIC ACID 1 MG PO TABS: 1.0000 mg | ORAL_TABLET | Freq: Every day | ORAL | Status: DC

## 2015-02-04 MED ORDER — VANCOMYCIN HCL 10 G IV SOLR: 1500.0000 mg | INTRAVENOUS | Status: DC

## 2015-02-04 MED ORDER — LORAZEPAM 1 MG PO TABS: 1.0000 mg | ORAL_TABLET | Freq: Four times a day (QID) | ORAL | Status: DC | PRN

## 2015-02-04 MED ORDER — THIAMINE HCL 100 MG/ML IJ SOLN: 100.0000 mg | Freq: Every day | INTRAMUSCULAR | Status: DC

## 2015-02-04 MED ORDER — POTASSIUM CHLORIDE CRYS ER 20 MEQ PO TBCR
40.0000 meq | EXTENDED_RELEASE_TABLET | ORAL | Status: AC
  Administered 2015-02-04 (×2): 40 meq via ORAL
  Filled 2015-02-04 (×2): qty 2

## 2015-02-04 MED ORDER — ADULT MULTIVITAMIN W/MINERALS CH: 1.0000 | ORAL_TABLET | Freq: Every day | ORAL | Status: DC

## 2015-02-04 MED ORDER — LORAZEPAM 2 MG/ML IJ SOLN
1.0000 mg | Freq: Four times a day (QID) | INTRAMUSCULAR | Status: DC | PRN
Start: 2015-02-04 — End: 2015-02-05

## 2015-02-04 MED ORDER — PIPERACILLIN-TAZOBACTAM 3.375 G IVPB
3.3750 g | Freq: Three times a day (TID) | INTRAVENOUS | Status: DC
  Administered 2015-02-04 (×3): 3.375 g via INTRAVENOUS
  Filled 2015-02-04 (×7): qty 50

## 2015-02-04 MED ORDER — VITAMIN B-1 100 MG PO TABS: 100.0000 mg | ORAL_TABLET | Freq: Every day | ORAL | Status: DC

## 2015-02-04 NOTE — Progress Notes (Signed)
Nurse in room assessing pt. Pt is A&O x4. Bil lower extremities very swollen, dry and flaky. Old ulcers to legs. No open wound areas noted at this time. Pt denies pain. No complaints at this time. Will continue to monitor. Bed is in lowest position and call bell is within reach.

## 2015-02-04 NOTE — Progress Notes (Signed)
UR chart review completed.  

## 2015-02-04 NOTE — Progress Notes (Signed)
[facility] PROGRESS NOTE  Matthew Wise ZOX:[id number] DOB: 1961/02/20 DOA: 02/03/2015 PCP: [name], MD  Assessment/Plan: 1- Sepsis -multifactorial in setting of UTI and cellulitis: resolved this am.  afebrile and non-toxic appearing. Continue vanc and zosyn day #2. Await blood and urine culture.   2-UTI  -on vanc and zosyn for sepsis: await urine culture. May be able to narrow antibitics tomorrow   3-Cellulitis -acute on chronic issue in setting of venous stasis ulceration. Appreciate wound consult. Reports he has been going to wound clinic weekly. IV vanc and zosyn as above  4-CKD -Cr 1.58 on admission. At baseline   5-Blepharitis  -noted blepharitis bilaterally  -do not suspect septic emboli at present -topical warm compresses    6-ETOH abuse -banana bag x 1.no s/sx withdrawal   7-hx/o DVT/PE failed coumadin -on xarelto. 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 Continue xarelto. Chart review indicates failed coumadin in past.    Hypokalemia: replete recheck. Magnesium within limits of normal  Code Status: full Family Communication: none present Disposition Plan: home when ready   Consultants:  none  Procedures:  none  Antibiotics:  Vancomycin 3.6.16>>  Zosyn 02/03/15>>  HPI/Subjective: Denies pain discomfort reports feeling better   Objective: Filed Vitals:   02/04/15 1447  BP: 128/79  Pulse: 66  Temp: 98.1 F (36.7 C)  Resp: 18    Intake/Output Summary (Last 24 hours) at 02/04/15 1629 Last data filed at 02/04/15 1613  Gross per 24 hour  Intake    780 ml  Output   1600 ml  Net   -820 ml   Filed Weights   02/03/15 1833 02/03/15 2355  Weight: 124.739 kg (275 lb) 124.694 kg (274 lb 14.4 oz)    Exam:   General:  Well  Nourished   Cardiovascular: RRR no MGR right leg with compression wrap  Respiratory: normal effort BS clear bilaterally  Abdomen: obese soft +BS   Musculoskeletal: no clubbing no cyanosis   Data  Reviewed: Basic Metabolic Panel:  Recent Labs Lab 02/03/15 1850 02/04/15 0019 02/04/15 0511  NA 134*  --  137  K 3.2*  --  2.9*  CL 96  --  101  CO2 31  --  30  GLUCOSE 114*  --  114*  BUN 14  --  12  CREATININE 1.58*  --  1.19  CALCIUM 8.9  --  8.4  MG  --  2.1  --    Liver Function Tests:  Recent Labs Lab 02/03/15 1850 02/04/15 0511  AST 26 20  ALT 25 21  ALKPHOS 73 61  BILITOT 0.9 0.6  PROT 8.7* 7.1  ALBUMIN 3.5 2.7*   No results for input(s): LIPASE, AMYLASE in the last 168 hours.  Recent Labs Lab 02/04/15 0019  AMMONIA 27   CBC:  Recent Labs Lab 02/03/15 1850 02/04/15 0511  WBC 13.6* 10.7*  NEUTROABS 10.3* 7.1  HGB 16.2 14.5  HCT 44.5 41.1  MCV 89.7 89.3  PLT 328 295   Cardiac Enzymes: No results for input(s): CKTOTAL, CKMB, CKMBINDEX, TROPONINI in the last 168 hours. BNP (last 3 results) No results for input(s): BNP in the last 8760 hours.  ProBNP (last 3 results) No results for input(s): PROBNP in the last 8760 hours.  CBG: No results for input(s): GLUCAP in the last 168 hours.  Recent Results (from the past 240 hour(s))  Urine culture     Status: None (Preliminary result)   Collection Time: 02/03/15  8:00 PM  Result Value  Ref Range Status   Specimen Description URINE, CLEAN CATCH  Final   Special Requests NONE  Final   Colony Count PENDING  Incomplete   Culture PENDING  Incomplete   Report Status PENDING  Incomplete  Culture, blood (routine x 2)     Status: None (Preliminary result)   Collection Time: 02/04/15 12:19 AM  Result Value Ref Range Status   Specimen Description LEFT ANTECUBITAL  Final   Special Requests BOTTLES DRAWN AEROBIC ONLY 6CC  Final   Culture NO GROWTH <24 HRS  Final   Report Status PENDING  Incomplete  Culture, blood (routine x 2)     Status: None (Preliminary result)   Collection Time: 02/04/15 12:27 AM  Result Value Ref Range Status   Specimen Description LEFT ANTECUBITAL  Final   Special Requests BOTTLES  DRAWN AEROBIC ONLY 6CC  Final   Culture NO GROWTH <24 HRS  Final   Report Status PENDING  Incomplete     Studies: US Venous Img Lower Bilateral  02/04/2015   CLINICAL DATA:  RIGHT calf pain and swelling for a week, history of BILATERAL DVT  EXAM: BILATERAL LOWER EXTREMITY VENOUS DOPPLER ULTRASOUND  TECHNIQUE: Gray-scale sonography with graded compression, as well as color Doppler and duplex ultrasound were performed to evaluate the lower extremity deep venous systems from the level of the common femoral vein and including the common femoral, femoral, profunda femoral, popliteal and calf veins including the posterior tibial, peroneal and gastrocnemius veins when visible. The superficial great saphenous vein was also interrogated. Spectral Doppler was utilized to evaluate flow at rest and with distal augmentation maneuvers in the common femoral, femoral and popliteal veins.  COMPARISON:  LEFT lower extremity 04/15/2012, RIGHT lower extremity 11/28/2008  FINDINGS: RIGHT LOWER EXTREMITY  Common Femoral Vein: Small amount of nonocclusive thrombus. Spontaneous flow maintained. Vessel incompletely compressible.  Saphenofemoral Junction: Patent and compressible  Profunda Femoral Vein: Nonocclusive thrombus within RIGHT profunda femoral vein. The vein is incompletely compressible. Spontaneous flow was present.  Femoral Vein: Nonocclusive thrombus within RIGHT femoral vein at proximal, mid and distal, nonocclusive, with spontaneous flow present. Vessel is only partially compressible.  Popliteal Vein: Nonocclusive thrombus within the short segment that is visualized.  Calf Veins: Inadequately visualized due to dressings.  Superficial Great Saphenous Vein: No evidence of thrombus. Normal compressibility and flow on color Doppler imaging.  Venous Reflux:  None.  Other Findings: Lymph nodes noted at RIGHT inguinal region, mildly enlarged up to 19 mm short axis diameter.  LEFT LOWER EXTREMITY  Common Femoral Vein: Small  amount of nonocclusive thrombus. Diminished compressibility. Spontaneous venous flow present.  Saphenofemoral Junction: No evidence of thrombus. Normal compressibility and flow on color Doppler imaging.  Profunda Femoral Vein: Nonocclusive thrombus present. Spontaneous flow is present. Diminished phasicity.  Femoral Vein: Nonocclusive thrombus present. Spontaneous flow present. Diminished phasicity. Incompletely compressible.  Popliteal Vein: Nonocclusive thrombus with diminished flow and impaired compressibility.  Calf Veins: No evidence of thrombus. Normal compressibility and flow on color Doppler imaging.  Superficial Great Saphenous Vein: Thrombosed at calf and ankle.  Venous Reflux:  None.  Other Findings:  None.  IMPRESSION: Nonocclusive thrombus is identified within the deep venous systems of both lower extremities including BILATERAL common femoral, femoral, profunda femoral, and popliteal veins.  Superficial thrombophlebitis involving the LEFT greater saphenous vein.  Abnormal mildly enlarged RIGHT inguinal lymph nodes.  When compared to the previous exam, appearance of the thrombosis in the LEFT lower extremity is little changed, question sequela of prior  DVT.  Thrombus within the RIGHT lower extremity deep venous system was not present on a prior exam from 2009 but acuity is uncertain, potentially chronic/remote DVT as well ; patient indicates a history of prior BILATERAL DVT, recommend correlation of the current findings with history.  Findings called to Dr. Roderic Palau on 02/04/2015 at 1417 hours.   Electronically Signed   By: Lavonia Dana M.D.   On: 02/04/2015 14:20    Scheduled Meds: . piperacillin-tazobactam (ZOSYN)  IV  3.375 g Intravenous Q8H  . rivaroxaban  20 mg Oral Q supper  . vancomycin  1,000 mg Intravenous Q12H   Continuous Infusions: . 0.9 % NaCl with KCl 20 mEq / L 50 mL/hr at 02/04/15 1316    Active Problems:   UTI (lower urinary tract infection)   Sepsis   Cellulitis    Blepharitis    Time spent: 40 minutes    [facility] Pager [phone]. If 7PM-7AM, please contact night-coverage at [email or web address], password [facility] 02/04/2015, 4:29 PM  LOS: 1 day

## 2015-02-04 NOTE — Consult Note (Signed)
WOC wound consult note Reason for Consult: Chronic venous stasis disease with resolving ulcer to right lower extremity.  Evidence healed ulcer to left leg.  Only wears compression to right leg, has removable compression he uses at home.  Wound type:Venous stasis Pressure Ulcer POA: n/a Measurement: 2 cm x 2.5 cmx 0.1 cm.  Dry skin surrounding ulceration.  Generalized edema noted in right lower extremity.  Wound bed: 50% pink and moist 50% new epithelial growth.   Drainage (amount, consistency, odor) None noted at this time.  Periwound: Warm to touch, edema, no redness noted.  Dressing procedure/placement/frequency:Cleanse right leg with soap and water and pat dry.  Moisturize leg with barrier ointment.  Applied wound contact layer (included in Profore box) to wound bed.  Apply 4 layer compression (Profore).  Change weekly.   Will not follow at this time.  Please re-consult if needed.  Domenic Moras RN BSN [place] Pager [phone]

## 2015-02-04 NOTE — Care Management Note (Addendum)
    Page 1 of 1   02/05/2015     10:35:05 AM CARE MANAGEMENT NOTE 02/05/2015  Patient:  HARRIS, PENTON   Account Number:  [id number]  Date Initiated:  02/04/2015  Documentation initiated by:  Theophilus Kinds  Subjective/Objective Assessment:   Pt admitted from home with sepsis, UTI. Pt lives alone and will return home at discharge. Pt is independent with ADL's. Pt has been receiving weekly dressing changes at the [facility] outpt PT dept and wants to continue this at discharge.     Action/Plan:   No CM needs noted.   Anticipated DC Date:  02/06/2015   Anticipated DC Plan:  [place]  CM consult      Choice offered to / List presented to:             Status of service:  Completed, signed off Medicare Important Message given?  NA - LOS <3 / Initial given by admissions (If response is "NO", the following Medicare IM given date fields will be blank) Date Medicare IM given:   Medicare IM given by:   Date Additional Medicare IM given:   Additional Medicare IM given by:    Discharge Disposition:  HOME/SELF CARE  Per UR Regulation:    If discussed at Long Length of Stay Meetings, dates discussed:    Comments:  02/05/15 [place], RN BSN CM Pt to be discharged home today. No CM needs noted. Pt is to keep his already scheduled appt with [facility] out[facility] wound clinic for leg wraps.  02/04/15 [place], RN BSN CM

## 2015-02-05 DIAGNOSIS — E876 Hypokalemia: Secondary | ICD-10-CM

## 2015-02-05 LAB — CBC WITH DIFFERENTIAL/PLATELET
BASOS ABS: 0 10*3/uL (ref 0.0–0.1)
Basophils Relative: 0 % (ref 0–1)
Eosinophils Absolute: 0.1 10*3/uL (ref 0.0–0.7)
Eosinophils Relative: 2 % (ref 0–5)
HEMATOCRIT: 40.9 % (ref 39.0–52.0)
Hemoglobin: 14.3 g/dL (ref 13.0–17.0)
LYMPHS PCT: 21 % (ref 12–46)
Lymphs Abs: 1.8 10*3/uL (ref 0.7–4.0)
MCH: 31.2 pg (ref 26.0–34.0)
MCHC: 35 g/dL (ref 30.0–36.0)
MCV: 89.1 fL (ref 78.0–100.0)
MONO ABS: 0.9 10*3/uL (ref 0.1–1.0)
MONOS PCT: 10 % (ref 3–12)
Neutro Abs: 5.8 10*3/uL (ref 1.7–7.7)
Neutrophils Relative %: 67 % (ref 43–77)
Platelets: 336 10*3/uL (ref 150–400)
RBC: 4.59 MIL/uL (ref 4.22–5.81)
RDW: 13.6 % (ref 11.5–15.5)
WBC: 8.6 10*3/uL (ref 4.0–10.5)

## 2015-02-05 LAB — COMPREHENSIVE METABOLIC PANEL
ALT: 18 U/L (ref 0–53)
AST: 17 U/L (ref 0–37)
Albumin: 2.7 g/dL — ABNORMAL LOW (ref 3.5–5.2)
Alkaline Phosphatase: 54 U/L (ref 39–117)
Anion gap: 7 (ref 5–15)
BILIRUBIN TOTAL: 0.4 mg/dL (ref 0.3–1.2)
BUN: 10 mg/dL (ref 6–23)
CO2: 27 mmol/L (ref 19–32)
CREATININE: 1.04 mg/dL (ref 0.50–1.35)
Calcium: 8.6 mg/dL (ref 8.4–10.5)
Chloride: 105 mmol/L (ref 96–112)
GFR, EST NON AFRICAN AMERICAN: 81 mL/min — AB (ref >90)
Glucose, Bld: 99 mg/dL (ref 70–99)
Potassium: 3.4 mmol/L — ABNORMAL LOW (ref 3.5–5.1)
Sodium: 139 mmol/L (ref 135–145)
Total Protein: 7.2 g/dL (ref 6.0–8.3)

## 2015-02-05 LAB — URINE CULTURE: Colony Count: 2000

## 2015-02-05 LAB — HIV ANTIBODY (ROUTINE TESTING W REFLEX): HIV Screen 4th Generation wRfx: NONREACTIVE

## 2015-02-05 MED ORDER — SULFAMETHOXAZOLE-TRIMETHOPRIM 800-160 MG PO TABS: 1.0000 | ORAL_TABLET | Freq: Two times a day (BID) | ORAL | Status: DC

## 2015-02-05 MED ORDER — POTASSIUM CHLORIDE CRYS ER 20 MEQ PO TBCR
40.0000 meq | EXTENDED_RELEASE_TABLET | Freq: Once | ORAL | Status: AC
  Administered 2015-02-05: 40 meq via ORAL
  Filled 2015-02-05: qty 2

## 2015-02-05 NOTE — Discharge Summary (Signed)
Physician Discharge Summary  Matthew Wise VHQ:[id number] DOB: [birth year] DOA: 02/03/2015  PCP: [name], MD  Admit date: 02/03/2015 Discharge date: 02/05/2015  Time spent: 40 minutes  Recommendations for Outpatient Follow-up:  1. Follow up with PCP 1-2 weeks for evaluation of resolution of UTI, recommend BMET to track potassium leve 2. Follow up with wound clinic as scheduled 02/06/15  Discharge Diagnoses:  Active Problems:   UTI (lower urinary tract infection)   Sepsis   Cellulitis   Blepharitis   Cellulitis of right lower extremity   Hypokalemia   Discharge Condition: stable  Diet recommendation: heart healthy  Filed Weights   02/03/15 1833 02/03/15 2355  Weight: 124.739 kg (275 lb) 124.694 kg (274 lb 14.4 oz)    History of present illness:  This is a 54 y.o. 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 Pt stated that he  had 1 week of increased urinary frequency and urgency. No fever, no abd pain. Denied any frank hematuria in setting of xarelto use. Pt also reported distal RLE pain and swelling. Has chronic venous stasis ulcers, also chronic ulceration followed by wound care. Reportedly compliant  w/ xarelto.  Presented to ER T 101.8, HR 90s, resp 20s, BP 110s-130s, satting 97% on RA. WBC 13.6, hgb 16.2, Cr 1.58. UA concerning for UTI (formal results pending).  Hospital Course:  1- Sepsis -multifactorial in setting of UTI and cellulitis: resolved quickly with fluids and antibiotics. At discharge has been afebrile and non-toxic appearing for 24 hours. At discharge leukocytosis resolved. Received vanc and zosyn. Blood culture with no growth to date. Urine culture pending at discharge. Will discharge with bactrim for 5 more days to complete 7 day course.   2-UTI  -on vanc and zosyn for sepsis: Urine culture  pending. Discharge with cipro. Follow up with PCP 1-2 weeks for evaluation of resolution of UTI  3-Cellulitis -acute on chronic issue in setting of venous stasis ulceration. Evaluated by [facility] who opined chronic venous stasis disease with resolving ulcer to RLE, no drainage, wound bed with new growth. He has appointment with wound clinic tomorrow. Antibiotics as above  4-CKD -Cr 1.58 on admission. Resolved at discharge with fluids.    5-Blepharitis  -noted blepharitis bilaterally.Do not suspect septic emboli at present. Improving slightly with warm compresses.   6-ETOH abuse no s/sx withdrawal   7-hx/o DVT/PE failed coumadin -on xarelto. 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 Continue xarelto. Chart review indicates failed coumadin in past.   Hypokalemia: repleted and resolved. Magnesium within limits of normal. Recommend BMET in 1-2 weeks at follow up with PCP to track potassim   Procedures:  none  Consultations:  [place]  Discharge Exam: Filed Vitals:   02/05/15 0559  BP: 167/84  Pulse: 73  Temp: 99.3 F (37.4 C)  Resp: 20    General: well nourished appears comfortable Cardiovascular: RRR no MGR left leg with no LE edema Respiratory: normal effort BS clear bilaterally MS: compression dressing to right leg intact. Toes warm to touch.    Discharge Instructions    Current Discharge Medication List    START taking these medications   Details  sulfamethoxazole-trimethoprim (BACTRIM DS,SEPTRA DS) 800-160 MG per tablet Take 1 tablet by mouth 2 (two) times daily. Qty: 10 tablet, Refills: 0      CONTINUE these medications which have NOT CHANGED   Details  oxycodone (OXY-IR) 5 MG capsule Take 1 capsule (5 mg total) by mouth every 4 (four) hours as needed for pain. Qty: 45 capsule, Refills: 0   Associated Diagnoses: DVT (deep venous thrombosis), unspecified laterality    rivaroxaban (XARELTO) 20 MG TABS tablet Take 1 tablet (20 mg total) by mouth daily with  supper. Qty: 30 tablet, Refills: 5   Associated Diagnoses: DVT (deep venous thrombosis), right      STOP taking these medications     naproxen (NAPROSYN) 500 MG tablet        No Known Allergies Follow-up Information    Follow up with [name], MD. Schedule an appointment as soon as possible for a visit in 1 week.   Specialty:  Internal Medicine   Why:  follow up with PCP for evaluation of resolution of UTI. recommend BMET to track potassium   Contact information:   [address] [place] [address] [phone]        The results of significant diagnostics from this hospitalization (including imaging, microbiology, ancillary and laboratory) are listed below for reference.    Significant Diagnostic Studies: US Venous Img Lower Bilateral  02/04/2015   CLINICAL DATA:  RIGHT calf pain and swelling for a week, history of BILATERAL DVT  EXAM: BILATERAL LOWER EXTREMITY VENOUS DOPPLER ULTRASOUND  TECHNIQUE: Gray-scale sonography with graded compression, as well as color Doppler and duplex ultrasound were performed to evaluate the lower extremity deep venous systems from the level of the common femoral vein and including the common femoral, femoral, profunda femoral, popliteal and calf veins including the posterior tibial, peroneal and gastrocnemius veins when visible. The superficial great saphenous vein was also interrogated. Spectral Doppler was utilized to evaluate flow at rest and with distal augmentation maneuvers in the common femoral, femoral and popliteal veins.  COMPARISON:  LEFT lower extremity 04/15/2012, RIGHT lower extremity 11/28/2008  FINDINGS: RIGHT LOWER EXTREMITY  Common Femoral Vein: Small amount of nonocclusive thrombus. Spontaneous flow maintained. Vessel incompletely compressible.  Saphenofemoral Junction: Patent and compressible  Profunda Femoral Vein: Nonocclusive thrombus within RIGHT profunda femoral vein. The vein is incompletely compressible.  Spontaneous flow was present.  Femoral Vein: Nonocclusive thrombus within RIGHT femoral vein at proximal, mid and distal, nonocclusive, with spontaneous flow present. Vessel is only partially compressible.  Popliteal Vein: Nonocclusive thrombus within the short segment that is visualized.  Calf Veins: Inadequately visualized due to dressings.  Superficial Great Saphenous Vein: No evidence of thrombus. Normal compressibility and flow on color Doppler imaging.  Venous Reflux:  None.  Other Findings: Lymph nodes noted at RIGHT inguinal region, mildly enlarged up to 19 mm short axis diameter.  LEFT LOWER EXTREMITY  Common Femoral Vein: Small amount of nonocclusive thrombus. Diminished compressibility. Spontaneous venous flow present.  Saphenofemoral Junction: No evidence of thrombus. Normal compressibility and flow on color Doppler imaging.  Profunda Femoral Vein: Nonocclusive thrombus present. Spontaneous flow is present. Diminished phasicity.  Femoral Vein: Nonocclusive thrombus present. Spontaneous flow present. Diminished phasicity. Incompletely compressible.  Popliteal Vein: Nonocclusive thrombus with diminished flow and impaired compressibility.  Calf Veins: No evidence of thrombus. Normal compressibility and flow on color Doppler imaging.  Superficial Great Saphenous Vein: Thrombosed at calf and ankle.  Venous Reflux:  None.  Other Findings:  None.  IMPRESSION: Nonocclusive thrombus is identified within the deep venous systems of both lower extremities including BILATERAL common femoral, femoral, profunda femoral, and popliteal veins.  Superficial thrombophlebitis involving the LEFT greater saphenous vein.  Abnormal mildly enlarged RIGHT inguinal lymph  nodes.  When compared to the previous exam, appearance of the thrombosis in the LEFT lower extremity is little changed, question sequela of prior DVT.  Thrombus within the RIGHT lower extremity deep venous system was not present on a prior exam from 2009 but acuity  is uncertain, potentially chronic/remote DVT as well ; patient indicates a history of prior BILATERAL DVT, recommend correlation of the current findings with history.  Findings called to Dr. Roderic Palau on 02/04/2015 at 1417 hours.   Electronically Signed   By: Lavonia Dana M.D.   On: 02/04/2015 14:20    Microbiology: Recent Results (from the past 240 hour(s))  Urine culture     Status: None (Preliminary result)   Collection Time: 02/03/15  8:00 PM  Result Value Ref Range Status   Specimen Description URINE, CLEAN CATCH  Final   Special Requests NONE  Final   Colony Count PENDING  Incomplete   Culture PENDING  Incomplete   Report Status PENDING  Incomplete  Culture, blood (routine x 2)     Status: None (Preliminary result)   Collection Time: 02/04/15 12:19 AM  Result Value Ref Range Status   Specimen Description LEFT ANTECUBITAL  Final   Special Requests BOTTLES DRAWN AEROBIC ONLY 6CC  Final   Culture NO GROWTH <24 HRS  Final   Report Status PENDING  Incomplete  Culture, blood (routine x 2)     Status: None (Preliminary result)   Collection Time: 02/04/15 12:27 AM  Result Value Ref Range Status   Specimen Description LEFT ANTECUBITAL  Final   Special Requests BOTTLES DRAWN AEROBIC ONLY 6CC  Final   Culture NO GROWTH <24 HRS  Final   Report Status PENDING  Incomplete     Labs: Basic Metabolic Panel:  Recent Labs Lab 02/03/15 1850 02/04/15 0019 02/04/15 0511 02/05/15 0628  NA 134*  --  137 139  K 3.2*  --  2.9* 3.4*  CL 96  --  101 105  CO2 31  --  30 27  GLUCOSE 114*  --  114* 99  BUN 14  --  12 10  CREATININE 1.58*  --  1.19 1.04  CALCIUM 8.9  --  8.4 8.6  MG  --  2.1  --   --    Liver Function Tests:  Recent Labs Lab 02/03/15 1850 02/04/15 0511 02/05/15 0628  AST 26 20 17   ALT 25 21 18   ALKPHOS 73 61 54  BILITOT 0.9 0.6 0.4  PROT 8.7* 7.1 7.2  ALBUMIN 3.5 2.7* 2.7*   No results for input(s): LIPASE, AMYLASE in the last 168 hours.  Recent Labs Lab  02/04/15 0019  AMMONIA 27   CBC:  Recent Labs Lab 02/03/15 1850 02/04/15 0511 02/05/15 0628  WBC 13.6* 10.7* 8.6  NEUTROABS 10.3* 7.1 5.8  HGB 16.2 14.5 14.3  HCT 44.5 41.1 40.9  MCV 89.7 89.3 89.1  PLT 328 295 336   Cardiac Enzymes: No results for input(s): CKTOTAL, CKMB, CKMBINDEX, TROPONINI in the last 168 hours. BNP: BNP (last 3 results) No results for input(s): BNP in the last 8760 hours.  ProBNP (last 3 results) No results for input(s): PROBNP in the last 8760 hours.  CBG: No results for input(s): GLUCAP in the last 168 hours.     SignedRadene Gunning  [facility] 02/05/2015, 10:29 AM

## 2015-02-05 NOTE — Progress Notes (Signed)
[name] discharged home per MD order.  Discharge instructions reviewed and discussed with the patient, all questions and concerns answered. Copy of instructions and scripts given to patient.    Medication List    STOP taking these medications        naproxen 500 MG tablet  Commonly known as:  NAPROSYN      TAKE these medications        oxycodone 5 MG capsule  Commonly known as:  OXY-IR  Take 1 capsule (5 mg total) by mouth every 4 (four) hours as needed for pain.     rivaroxaban 20 MG Tabs tablet  Commonly known as:  XARELTO  Take 1 tablet (20 mg total) by mouth daily with supper.     sulfamethoxazole-trimethoprim 800-160 MG per tablet  Commonly known as:  BACTRIM DS,SEPTRA DS  Take 1 tablet by mouth 2 (two) times daily.        IV site discontinued and catheter remains intact. Site without signs and symptoms of complications. Dressing and pressure applied.  Patient escorted to car by Grayland Ormond in a wheelchair,  no distress noted upon discharge.  [place], [place] 02/05/2015 3:28 PM

## 2015-02-06 ENCOUNTER — Ambulatory Visit (HOSPITAL_COMMUNITY): Payer: Medicare Other | Attending: Hematology and Oncology | Admitting: Physical Therapy

## 2015-02-06 DIAGNOSIS — Z86711 Personal history of pulmonary embolism: Secondary | ICD-10-CM | POA: Insufficient documentation

## 2015-02-06 DIAGNOSIS — S91001D Unspecified open wound, right ankle, subsequent encounter: Secondary | ICD-10-CM | POA: Diagnosis not present

## 2015-02-06 DIAGNOSIS — Z86718 Personal history of other venous thrombosis and embolism: Secondary | ICD-10-CM | POA: Insufficient documentation

## 2015-02-06 DIAGNOSIS — S81001D Unspecified open wound, right knee, subsequent encounter: Secondary | ICD-10-CM | POA: Insufficient documentation

## 2015-02-06 DIAGNOSIS — S81801D Unspecified open wound, right lower leg, subsequent encounter: Secondary | ICD-10-CM

## 2015-02-06 NOTE — Therapy (Signed)
[place], [place], [address] Phone: [phone]   Fax:  [phone]  Wound Care Therapy Re-evaluation  Patient Details  Name: Matthew Wise MRN: [id number] Date of Birth: [birth year] Referring Provider:  Redmond School, MD  Encounter Date: 02/06/2015      PT End of Session - 02/06/15 1236    Visit Number 34   Number of Visits 40   Date for PT Re-Evaluation 03/13/15   Authorization Type medicare (gcode updated on visit #30)   Authorization - Visit Number 8   Authorization - Number of Visits 40   PT Start Time 1030   PT Stop Time 1140   PT Time Calculation (min) 70 min   Activity Tolerance Patient tolerated treatment well      Past Medical History  Diagnosis Date  . DVT of leg (deep venous thrombosis)     rt. leg  . Inferior vena caval thrombosis     Chronic  . DVT (deep venous thrombosis) 06/15/2011  . H/O PE (pulmonary embolism) 06/15/2011  . 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 Chronic anticoagulation 11/14/2014    No past surgical history on file.  There were no vitals taken for this visit.  Visit Diagnosis:  Open wound of knee, leg (except thigh), and ankle, complicated, right, subsequent encounter                 Wound Therapy - 02/06/15 1217    Subjective Pt states he was just discharged from the hospital yesterday.  Was admitted on Sunday for UTI and cellulitis flare up.  Currently on antibiotics.  Pt reports he is concerned with area on posterior Rt LE.    Pain Assessment 0-10   Pain Score 8    Pain Type Acute pain   Pain Location Leg  posterior Rt LE   Pain Orientation Right   Wound Properties Date First Assessed: 10/16/14 Time First Assessed: 1045 Wound Type: Other (Comment) Location: Leg Location Orientation: Right;Anterior Wound Description (Comments): Mid anterior Right Lower leg  Present on Admission: No   Dressing Type Compression wrap   Dressing Changed Changed   Dressing Status Clean;Dry;Intact   Dressing Change Frequency Other (Comment)  Once a week on Mondays per [place] RN   Site / Wound Assessment Dressing in place / Unable to assess   Wound Length (cm) 1.2 cm  was 1.8   Wound Width (cm) 1 cm  was 1.5   Wound Depth (cm) --  unsure if any depth as was dry/adherent, was 0.5cm   Drainage Amount None   Treatment Cleansed;Debridement (Selective)   Wound Properties Date First Assessed: 02/06/15 Time First Assessed: 1100 Wound Type: Other (Comment) Location: Leg Location Orientation: Right;Posterior Wound Description (Comments): Rt posterior mid calf region Present on Admission: No   Dressing Type Compression wrap;Abdominal pads   Dressing Changed New   Dressing Status Clean;Dry;Intact   Dressing Change Frequency Other (Comment)   Site / Wound Assessment Painful;Friable;Dusky;Brown;Red   Peri-wound Assessment Edema   Drainage Amount Moderate   Drainage Description Serosanguineous   Treatment Cleansed   Selective Debridement - Location entire wound bed.   Selective Debridement - Tools Used Forceps;Scalpel;Scissors   Selective Debridement - Tissue Removed necrotic tissue and dry skin   Wound Therapy - Clinical Statement Pt returns for wound care after last being see  on 01/25/15.  Pt was admitted on  3/6 and discharged on 3/8 for UTI and acute cellulitis of Rt LE.  PT comes today with profore intact (placed by hospital staff).  Unsure if old dressing remains in anterior wound bed or if this is dry adherent eschar in wound.  Debrided and dressed with hydrogel to soften area.  Pt with large area on posterior Rt LE that is warm to touch and indurated.  There are small areas below where serosanginious fluid is moderatetely draining.  This area is similiar to the area on patients anterior LE prior to skin rupture and develpemnt of wound.  Predict this area will also erupt.  Rt  LE measured with no increase in fluid as compared to last visit.  Resumed compression bandaging for Rt LE and will continue to require skilled intervention until wounds are healed on Rt LE.     Factors Delaying/Impairing Wound Healing Altered sensation;Diabetes Mellitus;Infection - systemic/local   Hydrotherapy Plan Debridement;Dressing change;Other (comment)  compression bandaging using short stretch   Wound Therapy - Frequency Other (comment)  2X weekly   Wound Plan Continue Wound care and compression bandaging for Rt LE.     Dressing  hydrogel anterior wound   Dressing hydrofiber to posterior wound and ABD pads   Dressing short stretch compression bandaging   Decrease Necrotic Tissue to 0   Decrease Necrotic Tissue - Progress Progressing toward goal   Increase Granulation Tissue to 100   Increase Granulation Tissue - Progress Progressing toward goal   Decrease Length/Width/Depth by (cm) wound to decrease by 1.5x1.5.1.5   Decrease Length/Width/Depth - Progress Progressing toward goal   Improve Drainage Characteristics Other (comment)   Improve Drainage Characteristics - Progress Progressing toward goal   Additional Wound Therapy Goal pt to report no pain   Additional Wound Therapy Goal - Progress Progressing toward goal   Time For Goal Achievement Other (comment)  4 weeks   Wound Therapy - Potential for Goals Good                              Problem List Patient Active Problem List   Diagnosis Date Noted  . Hypokalemia 02/05/2015  . Cellulitis of right lower extremity   . UTI (lower urinary tract infection) 02/03/2015  . Sepsis 02/03/2015  . Cellulitis 02/03/2015  . Blepharitis 02/03/2015  . Chronic anticoagulation 11/14/2014  . 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 Leg wound, left 08/19/2011  . DVT (deep venous thrombosis) 06/15/2011  . H/O PE (pulmonary embolism) 06/15/2011  . Congenital inferior vena  cava interruption 06/15/2011  . 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 [place], [place], [address] Phone: [phone]   Fax:  [place] PT DPT [phone]

## 2015-02-09 LAB — CULTURE, BLOOD (ROUTINE X 2)
Culture: NO GROWTH
Culture: NO GROWTH

## 2015-02-12 ENCOUNTER — Ambulatory Visit (HOSPITAL_COMMUNITY): Payer: Medicare Other | Admitting: Physical Therapy

## 2015-02-12 DIAGNOSIS — S81801D Unspecified open wound, right lower leg, subsequent encounter: Principal | ICD-10-CM

## 2015-02-12 DIAGNOSIS — S91002D Unspecified open wound, left ankle, subsequent encounter: Secondary | ICD-10-CM

## 2015-02-12 DIAGNOSIS — Z86718 Personal history of other venous thrombosis and embolism: Secondary | ICD-10-CM | POA: Diagnosis not present

## 2015-02-12 DIAGNOSIS — S91001D Unspecified open wound, right ankle, subsequent encounter: Principal | ICD-10-CM

## 2015-02-12 DIAGNOSIS — S81002D Unspecified open wound, left knee, subsequent encounter: Secondary | ICD-10-CM

## 2015-02-12 DIAGNOSIS — S81001D Unspecified open wound, right knee, subsequent encounter: Secondary | ICD-10-CM

## 2015-02-12 DIAGNOSIS — Z86711 Personal history of pulmonary embolism: Secondary | ICD-10-CM | POA: Diagnosis not present

## 2015-02-12 DIAGNOSIS — S81802D Unspecified open wound, left lower leg, subsequent encounter: Secondary | ICD-10-CM

## 2015-02-12 NOTE — Therapy (Signed)
[place], [place], [address] Phone: [phone]   Fax:  [phone]  Wound Care Therapy  Patient Details  Name: Matthew Wise MRN: [id number] Date of Birth: [birth year] Referring Provider:  Redmond School, MD  Encounter Date: 02/12/2015      PT End of Session - 02/12/15 1208    Visit Number 34   Number of Visits 40   Date for PT Re-Evaluation 02/10/15   Authorization - Visit Number 73   Authorization - Number of Visits 40   PT Start Time 0940   PT Stop Time 1040   PT Time Calculation (min) 60 min   Activity Tolerance Patient tolerated treatment well   Behavior During Therapy [facility] for tasks assessed/performed      Past Medical History  Diagnosis Date  . DVT of leg (deep venous thrombosis)     rt. leg  . Inferior vena caval thrombosis     Chronic  . DVT (deep venous thrombosis) 06/15/2011  . H/O PE (pulmonary embolism) 06/15/2011  . 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 Chronic anticoagulation 11/14/2014    No past surgical history on file.  There were no vitals filed for this visit.  Visit Diagnosis:  Open wound of knee, leg (except thigh), and ankle, complicated, right, subsequent encounter  Open wound of knee, leg (except thigh), and ankle, complicated, left, subsequent encounter                 Wound Therapy - 02/12/15 1212    Subjective Pt states his leg is still sore and thinks it might get worse before it gets better.   Wound Properties Date First Assessed: 02/06/15 Time First Assessed: 1100 Wound Type: Other (Comment) Location: Leg Location Orientation: Right;Posterior Wound Description (Comments): Rt posterior mid calf region Present on Admission: No   Dressing Type Compression wrap;Abdominal pads  medihoney colloid adhesive sheet   Dressing Status Clean;Dry;Intact   Site / Wound  Assessment Painful;Clean;Friable;Red;Pink   Wound Length (cm) 1 cm   Wound Width (cm) 1.5 cm   Wound Depth (cm) --  unknown but shallow at this time   Drainage Amount Minimal   Drainage Description Serosanguineous   Treatment Cleansed;Debridement (Selective)   Wound Properties Date First Assessed: 10/16/14 Time First Assessed: 1045 Wound Type: Other (Comment) Location: Leg Location Orientation: Right;Anterior Wound Description (Comments): Mid anterior Right Lower leg Present on Admission: No Final Assessment Date: 02/12/15 Final Assessment Time: 6195   Selective Debridement - Location entire wound bed.   Selective Debridement - Tools Used Forceps;Scalpel;Scissors   Selective Debridement - Tissue Removed necrotic tissue and dry skin   Wound Therapy - Clinical Statement Anterior wound now healed.  Posterior area decreased overall in size, however unsure if there is any depth as wound has not errupted. yet.  shallow, raw area measures 1 X 1.5 cm with several smaller wounds beneath approx 0.5cm2 in size with drainage.  No drainage is coming from central wound at this time.  Induration remains perimeter of wounds, however decreased from less visit.  Placed medihoney colloid bandage over area to promote lymphatic movement to pull drainage from area. Used profore system today as patient did not have all his bandages wtih him today.     Factors Delaying/Impairing Wound Healing Altered sensation;Diabetes Mellitus;Infection - systemic/local   Hydrotherapy Plan Debridement;Dressing change;Other (comment)   Wound Plan Continue Wound care  and compression bandaging for Rt LE.     Dressing  medihoney colloid sheet, profore                              Problem List Patient Active Problem List   Diagnosis Date Noted  . Hypokalemia 02/05/2015  . Cellulitis of right lower extremity   . UTI (lower urinary tract infection) 02/03/2015  . Sepsis 02/03/2015  . Cellulitis 02/03/2015  .  Blepharitis 02/03/2015  . Chronic anticoagulation 11/14/2014  . 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 Leg wound, left 08/19/2011  . DVT (deep venous thrombosis) 06/15/2011  . H/O PE (pulmonary embolism) 06/15/2011  . 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 [place], [place], [address] Phone: [phone]   Fax:  [phone]

## 2015-02-13 ENCOUNTER — Encounter (HOSPITAL_COMMUNITY): Payer: Medicare Other | Attending: Oncology | Admitting: Oncology

## 2015-02-13 VITALS — BP 127/73 | HR 79 | Temp 98.5°F | Resp 20

## 2015-02-13 DIAGNOSIS — D6869 Other thrombophilia: Secondary | ICD-10-CM

## 2015-02-13 DIAGNOSIS — I82401 Acute embolism and thrombosis of unspecified deep veins of right lower extremity: Secondary | ICD-10-CM

## 2015-02-13 DIAGNOSIS — Z86711 Personal history of pulmonary embolism: Secondary | ICD-10-CM | POA: Diagnosis not present

## 2015-02-13 DIAGNOSIS — I871 Compression of vein: Secondary | ICD-10-CM

## 2015-02-13 DIAGNOSIS — I82409 Acute embolism and thrombosis of unspecified deep veins of unspecified lower extremity: Secondary | ICD-10-CM

## 2015-02-13 DIAGNOSIS — Z7901 Long term (current) use of anticoagulants: Secondary | ICD-10-CM

## 2015-02-13 DIAGNOSIS — I82403 Acute embolism and thrombosis of unspecified deep veins of lower extremity, bilateral: Secondary | ICD-10-CM | POA: Diagnosis not present

## 2015-02-13 MED ORDER — RIVAROXABAN 20 MG PO TABS
20.0000 mg | ORAL_TABLET | Freq: Every day | ORAL | Status: DC
Start: 2015-02-13 — End: 2015-05-22

## 2015-02-13 MED ORDER — OXYCODONE HCL 5 MG PO CAPS: 5.0000 mg | ORAL_CAPSULE | ORAL | Status: DC | PRN

## 2015-02-13 NOTE — Patient Instructions (Addendum)
[place] at [facility] Discharge Instructions  RECOMMENDATIONS MADE BY THE CONSULTANT AND ANY TEST RESULTS WILL BE SENT TO YOUR REFERRING PHYSICIAN. Labs in 3 months and return in 3 months  Refill on Xarelto given   Thank you for choosing [place] at [facility] to provide your oncology and hematology care.  To afford each patient quality time with our provider, please arrive at least 15 minutes before your scheduled appointment time.    You need to re-schedule your appointment should you arrive 10 or more minutes late.  We strive to give you quality time with our providers, and arriving late affects you and other patients whose appointments are after yours.  Also, if you no show three or more times for appointments you may be dismissed from the clinic at the providers discretion.     Again, thank you for choosing [facility].  Our hope is that these requests will decrease the amount of time that you wait before being seen by our physicians.       _____________________________________________________________  Should you have questions after your visit to [facility], please contact our office at [phone] between the hours of 8:30 a.m. and 4:30 p.m.  Voicemails left after 4:30 p.m. will not be returned until the following business day.  For prescription refill requests, have your pharmacy contact our office.

## 2015-02-13 NOTE — Assessment & Plan Note (Addendum)
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 Patient seen by Dr. Christoper Allegra at [facility] who found that he also has sickle cell trait which increases the patient's risk of DVTs. He did not have cardiolipin antibodies, nor beta-2 glycoprotein antibodies. It is believed by both, Dr. Mare Ferrari and [place], that he is not a good candidate for ongoing work and is a candidate for permanent disability.  Currently on Xarelto for supposed Coumadin failure (although compliance was more likely) when he developed a DVT in lower extremity while on Coumadin.  Hospitalization earlier this month is appreciated and notes are reviewed.  Continue Xarelto daily as prescribed.  Labs in 3 months: CBC diff, CMET.  Refill on Xarelto and Oxycodone provided today.  Return in 3 months for follow-up.

## 2015-02-13 NOTE — Progress Notes (Signed)
Matthew Wise., MD [place] [address]  DVT (deep venous thrombosis), bilateral - Plan: CBC with Differential, Comprehensive metabolic panel  DVT (deep venous thrombosis), unspecified laterality - Plan: oxycodone (OXY-IR) 5 MG capsule  DVT (deep venous thrombosis), right - Plan: rivaroxaban (XARELTO) 20 MG TABS tablet  CURRENT THERAPY: Xarelto 20 mg daily due to Coumadin failure with acute DVT (likely due to noncompliance with Coumadin)  INTERVAL HISTORY: Matthew Wise 54 y.o. 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 He was then started on Xarelto loading dose and transitioned to the maintenance dose after 21 days.   Chart reviewed.  Hospitalization from 3/6- 02/05/2015 for UTI sepsis with cellulitis is noted.  He reports that for a few days he was feverish and noted urinary frequency and malodorous urine.  He reports that his symptoms are completely resolved.  Cause of   I personally reviewed and went over laboratory results with the patient.  The results are noted within this dictation.  I personally reviewed and went over radiographic studies with the patient.  The results are noted within this dictation.    He denies any complaints and ROS questioning is negative.    Past Medical History  Diagnosis Date  . DVT of leg (deep venous thrombosis)     rt. leg  . Inferior vena caval thrombosis     Chronic  . DVT (deep venous thrombosis) 06/15/2011  . H/O PE (pulmonary embolism) 06/15/2011  . 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    . 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 Chronic anticoagulation 11/14/2014    has Post-phlebitic syndrome; DVT (deep venous thrombosis); H/O PE (pulmonary embolism); Congenital inferior vena cava interruption; Leg wound, left; IVC (inferior vena cava obstruction); Coumadin failure; Chronic anticoagulation; UTI (lower urinary tract infection); Cellulitis; Blepharitis; and Cellulitis of right lower extremity on his problem list.     has No Known Allergies.  Mr. [name] had no medications administered during this visit.  No past surgical history on file.  Denies any headaches, dizziness, double vision, fevers, chills, night sweats, nausea, vomiting, diarrhea, constipation, chest pain, heart palpitations, shortness of breath, blood in stool, black tarry stool, urinary pain, urinary burning, urinary frequency, hematuria.   PHYSICAL EXAMINATION  ECOG PERFORMANCE STATUS: 1 - Symptomatic but completely ambulatory  Filed Vitals:   02/13/15 1146  BP: 127/73  Pulse: 79  Temp: 98.5 F (36.9 C)  Resp: 20    GENERAL:alert, no distress, well nourished, well developed, comfortable, cooperative and smiling SKIN: skin color, texture, turgor are normal, no rashes or significant lesions HEAD: Normocephalic, No masses, lesions, tenderness or abnormalities EYES: normal, PERRLA, EOMI, Conjunctiva are pink and non-injected EARS: External ears normal OROPHARYNX:lips, buccal mucosa, and tongue normal and mucous membranes are moist  NECK: supple, no stridor, non-tender, trachea midline LYMPH:  no palpable lymphadenopathy BREAST:not examined LUNGS: clear to auscultation  HEART: regular rate & rhythm, no murmurs and no gallops ABDOMEN:abdomen soft and normal bowel sounds BACK: Back symmetric, no curvature. EXTREMITIES:less then 2 second capillary refill, no joint deformities, effusion, or  inflammation, no skin discoloration, no cyanosis.  Lower extremities  wrapped. NEURO: alert & oriented x 3 with fluent speech, no focal motor/sensory deficits, gait normal   LABORATORY DATA: CBC    Component Value Date/Time   WBC 8.6 02/05/2015 0628   RBC 4.59 02/05/2015 0628   RBC 5.46 12/10/2008 0600   HGB 14.3 02/05/2015 0628   HCT 40.9 02/05/2015 0628   PLT 336 02/05/2015 0628   MCV 89.1 02/05/2015 0628   MCH 31.2 02/05/2015 0628   MCHC 35.0 02/05/2015 0628   RDW 13.6 02/05/2015 0628   LYMPHSABS 1.8 02/05/2015 0628   MONOABS 0.9 02/05/2015 0628   EOSABS 0.1 02/05/2015 0628   BASOSABS 0.0 02/05/2015 0628      Chemistry      Component Value Date/Time   NA 139 02/05/2015 0628   K 3.4* 02/05/2015 0628   CL 105 02/05/2015 0628   CO2 27 02/05/2015 0628   BUN 10 02/05/2015 0628   CREATININE 1.04 02/05/2015 0628      Component Value Date/Time   CALCIUM 8.6 02/05/2015 0628   ALKPHOS 54 02/05/2015 0628   AST 17 02/05/2015 0628   ALT 18 02/05/2015 0628   BILITOT 0.4 02/05/2015 0628        RADIOGRAPHIC STUDIES:  US Venous Img Lower Bilateral  02/04/2015   CLINICAL DATA:  RIGHT calf pain and swelling for a week, history of BILATERAL DVT  EXAM: BILATERAL LOWER EXTREMITY VENOUS DOPPLER ULTRASOUND  TECHNIQUE: Gray-scale sonography with graded compression, as well as color Doppler and duplex ultrasound were performed to evaluate the lower extremity deep venous systems from the level of the common femoral vein and including the common femoral, femoral, profunda femoral, popliteal and calf veins including the posterior tibial, peroneal and gastrocnemius veins when visible. The superficial great saphenous vein was also interrogated. Spectral Doppler was utilized to evaluate flow at rest and with distal augmentation maneuvers in the common femoral, femoral and popliteal veins.  COMPARISON:  LEFT lower extremity 04/15/2012, RIGHT lower extremity 11/28/2008  FINDINGS: RIGHT LOWER EXTREMITY  Common Femoral Vein: Small amount of nonocclusive thrombus.  Spontaneous flow maintained. Vessel incompletely compressible.  Saphenofemoral Junction: Patent and compressible  Profunda Femoral Vein: Nonocclusive thrombus within RIGHT profunda femoral vein. The vein is incompletely compressible. Spontaneous flow was present.  Femoral Vein: Nonocclusive thrombus within RIGHT femoral vein at proximal, mid and distal, nonocclusive, with spontaneous flow present. Vessel is only partially compressible.  Popliteal Vein: Nonocclusive thrombus within the short segment that is visualized.  Calf Veins: Inadequately visualized due to dressings.  Superficial Great Saphenous Vein: No evidence of thrombus. Normal compressibility and flow on color Doppler imaging.  Venous Reflux:  None.  Other Findings: Lymph nodes noted at RIGHT inguinal region, mildly enlarged up to 19 mm short axis diameter.  LEFT LOWER EXTREMITY  Common Femoral Vein: Small amount of nonocclusive thrombus. Diminished compressibility. Spontaneous venous flow present.  Saphenofemoral Junction: No evidence of thrombus. Normal compressibility and flow on color Doppler imaging.  Profunda Femoral Vein: Nonocclusive thrombus present. Spontaneous flow is present. Diminished phasicity.  Femoral Vein: Nonocclusive thrombus present. Spontaneous flow present. Diminished phasicity. Incompletely compressible.  Popliteal Vein: Nonocclusive thrombus with diminished flow and impaired compressibility.  Calf Veins: No evidence of thrombus. Normal compressibility and flow on color Doppler imaging.  Superficial Great Saphenous Vein: Thrombosed at calf and ankle.  Venous Reflux:  None.  Other Findings:  None.  IMPRESSION: Nonocclusive thrombus is identified within the deep venous systems of both lower extremities  including BILATERAL common femoral, femoral, profunda femoral, and popliteal veins.  Superficial thrombophlebitis involving the LEFT greater saphenous vein.  Abnormal mildly enlarged RIGHT inguinal lymph nodes.  When compared to the  previous exam, appearance of the thrombosis in the LEFT lower extremity is little changed, question sequela of prior DVT.  Thrombus within the RIGHT lower extremity deep venous system was not present on a prior exam from 2009 but acuity is uncertain, potentially chronic/remote DVT as well ; patient indicates a history of prior BILATERAL DVT, recommend correlation of the current findings with history.  Findings called to Dr. Roderic Palau on 02/04/2015 at 1417 hours.   Electronically Signed   By: Lavonia Dana M.D.   On: 02/04/2015 14:20     ASSESSMENT AND PLAN:  DVT (deep venous thrombosis) Hypercoagulable state secondary to congenital interruption of his inferior vena cava with 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 Patient seen by Dr. Christoper Allegra at [facility] who found that he also has sickle cell trait which increases the patient's risk of DVTs. He did not have cardiolipin antibodies, nor beta-2 glycoprotein antibodies. It is believed by both, Dr. Mare Ferrari and [place], that he is not a good candidate for ongoing work and is a candidate for permanent disability.  Currently on Xarelto for supposed Coumadin failure (although compliance was more likely) when he developed a DVT in lower extremity while on Coumadin.  Hospitalization earlier this month is appreciated and notes are reviewed.  Continue Xarelto daily as prescribed.  Labs in 3 months: CBC diff, CMET.  Refill on Xarelto and Oxycodone provided today.  Return in 3 months for follow-up.   THERAPY PLAN:  Continue Xarelto daily.  All questions were answered. The patient knows to call the clinic with any problems, questions or concerns. We can certainly see the patient much sooner if necessary.  Patient and plan discussed with Dr. Ancil Linsey and she is in agreement with the aforementioned.   This note is electronically signed by:  Robynn Pane 02/13/2015 12:16 PM

## 2015-02-14 ENCOUNTER — Ambulatory Visit (HOSPITAL_COMMUNITY): Payer: Medicare Other | Admitting: Physical Therapy

## 2015-02-14 DIAGNOSIS — S81802D Unspecified open wound, left lower leg, subsequent encounter: Secondary | ICD-10-CM

## 2015-02-14 DIAGNOSIS — Z86711 Personal history of pulmonary embolism: Secondary | ICD-10-CM | POA: Diagnosis not present

## 2015-02-14 DIAGNOSIS — S91001D Unspecified open wound, right ankle, subsequent encounter: Principal | ICD-10-CM

## 2015-02-14 DIAGNOSIS — S81001D Unspecified open wound, right knee, subsequent encounter: Secondary | ICD-10-CM | POA: Diagnosis not present

## 2015-02-14 DIAGNOSIS — S81002D Unspecified open wound, left knee, subsequent encounter: Secondary | ICD-10-CM

## 2015-02-14 DIAGNOSIS — Z86718 Personal history of other venous thrombosis and embolism: Secondary | ICD-10-CM | POA: Diagnosis not present

## 2015-02-14 DIAGNOSIS — S81801D Unspecified open wound, right lower leg, subsequent encounter: Principal | ICD-10-CM

## 2015-02-14 DIAGNOSIS — S91002D Unspecified open wound, left ankle, subsequent encounter: Secondary | ICD-10-CM

## 2015-02-14 NOTE — Therapy (Signed)
[place], [place], [address] Phone: [phone]   Fax:  [phone]  Wound Care Therapy  Patient Details  Name: Matthew Wise MRN: [id number] Date of Birth: [birth year] Referring Provider:  Redmond School, MD  Encounter Date: 02/14/2015      PT End of Session - 02/14/15 1152    Visit Number 35   Number of Visits 40   Date for PT Re-Evaluation 02/10/15   Authorization - Visit Number 8   Authorization - Number of Visits 40   PT Start Time 1102   PT Stop Time 1140   PT Time Calculation (min) 38 min   Activity Tolerance Patient tolerated treatment well   Behavior During Therapy [place] for tasks assessed/performed      Past Medical History  Diagnosis Date  . DVT of leg (deep venous thrombosis)     rt. leg  . Inferior vena caval thrombosis     Chronic  . DVT (deep venous thrombosis) 06/15/2011  . H/O PE (pulmonary embolism) 06/15/2011  . 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 Chronic anticoagulation 11/14/2014    No past surgical history on file.  There were no vitals filed for this visit.  Visit Diagnosis:  Open wound of knee, leg (except thigh), and ankle, complicated, right, subsequent encounter  Open wound of knee, leg (except thigh), and ankle, complicated, left, subsequent encounter                 Wound Therapy - 02/14/15 1147    Subjective Pt states he thinks his leg is smaller where his new wound is posteriorly.  States it is also less tender.   Pain Score 0-No pain   Wound Properties Date First Assessed: 02/06/15 Time First Assessed: 1100 Wound Type: Other (Comment) Location: Leg Location Orientation: Right;Posterior Wound Description (Comments): Rt posterior mid calf region Present on Admission: No   Dressing Type Compression wrap;Abdominal pads  medihoney colloid adhesive sheet   Dressing  Changed Changed   Dressing Status Clean;Dry;Intact   Site / Wound Assessment Painful;Clean;Friable;Red;Pink   Drainage Amount Minimal   Drainage Description Serosanguineous   Treatment Cleansed;Debridement (Selective)   Selective Debridement - Location entire wound bed.   Selective Debridement - Tools Used Forceps   Selective Debridement - Tissue Removed necrotic tissue and dry skin   Wound Therapy - Clinical Statement Overall improvment in skin integrity and decreased edema.  Unsure of depth of wounds at this point.  Able to remove large amount of slough and dry skin from perimeter today.  Medihoney colloid appears to be working well to pull out edema and drainage.  Completed dressing with short stretch bandages.  MLD not completed today.   Factors Delaying/Impairing Wound Healing Altered sensation;Diabetes Mellitus;Infection - systemic/local   Hydrotherapy Plan Debridement;Dressing change;Other (comment)   Wound Plan Continue Wound care and compression bandaging for Rt LE.     Dressing  medihoney colloid sheet   Dressing short stretch bandages, 1/2" foam             Problem List Patient Active Problem List   Diagnosis Date Noted  . Cellulitis of right lower extremity   . UTI (lower urinary tract infection) 02/03/2015  . Cellulitis 02/03/2015  . Blepharitis 02/03/2015  . Chronic anticoagulation 11/14/2014  . 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 Leg wound, left 08/19/2011  .  DVT (deep venous thrombosis) 06/15/2011  . H/O PE (pulmonary embolism) 06/15/2011  . 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    Teena Irani, PTA/CLT [phone] 02/14/2015, 11:54 AM  [place], [place], [address] Phone: [phone]   Fax:  [phone]

## 2015-02-19 ENCOUNTER — Ambulatory Visit (HOSPITAL_COMMUNITY): Payer: Medicare Other | Admitting: Physical Therapy

## 2015-02-19 DIAGNOSIS — T148XXA Other injury of unspecified body region, initial encounter: Secondary | ICD-10-CM

## 2015-02-19 DIAGNOSIS — Z86711 Personal history of pulmonary embolism: Secondary | ICD-10-CM | POA: Diagnosis not present

## 2015-02-19 DIAGNOSIS — S81001D Unspecified open wound, right knee, subsequent encounter: Secondary | ICD-10-CM | POA: Diagnosis not present

## 2015-02-19 DIAGNOSIS — S91001D Unspecified open wound, right ankle, subsequent encounter: Secondary | ICD-10-CM | POA: Diagnosis not present

## 2015-02-19 DIAGNOSIS — Z86718 Personal history of other venous thrombosis and embolism: Secondary | ICD-10-CM | POA: Diagnosis not present

## 2015-02-19 NOTE — Addendum Note (Signed)
Encounter addended by: Leeroy Cha, PT on: 02/19/2015  8:51 PM<BR>     Documentation filed: Charges VN

## 2015-02-19 NOTE — Therapy (Addendum)
[place], [place], [address] Phone: [phone]   Fax:  [phone]  Wound Care Therapy Reevaluation  Patient Details  Name: Matthew Wise MRN: [id number] Date of Birth: 1961/11/08 Referring Provider:  Redmond School, MD  Encounter Date: 02/19/2015      PT End of Session - 02/19/15 1644    Visit Number 36   Number of Visits 66   Date for PT Re-Evaluation 04/20/15   Authorization Type medicare (gcode updated on visit #30)   Authorization - Visit Number 37   Authorization - Number of Visits 40   PT Start Time 3338   PT Stop Time 1436   PT Time Calculation (min) 44 min   Activity Tolerance Patient tolerated treatment well   Behavior During Therapy [facility] for tasks assessed/performed      Past Medical History  Diagnosis Date  . DVT of leg (deep venous thrombosis)     rt. leg  . Inferior vena caval thrombosis     Chronic  . DVT (deep venous thrombosis) 06/15/2011  . H/O PE (pulmonary embolism) 06/15/2011  . 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 Chronic anticoagulation 11/14/2014    No past surgical history on file.  There were no vitals filed for this visit.  Visit Diagnosis:  Nonhealing nonsurgical wound                 Wound Therapy - 02/19/15 1634    Subjective Pt has been seen in this clinic since 08/21/2014 at that time the pt had a wound on the posterior aspect of his Rt LE.  With treatment this resolved on 10/16/2014.  At that time the pt had to be admitted to the hospital for blood clots and had a new wound that appeared on the anterior aspect of his LE; this wound recently healed 02/06/2015.  He came back to therapy on 02/06/2015 after being admitted into the hospital on 02/03/2015 discharge on 02/05/2015 due to celllulitis.  Doppler studies done at this time were + for B DVT. Pt states that he has no pain  in this wound.    Pain Score 0-No pain   Wound Properties Date First Assessed: 02/06/15 Time First Assessed: 1100 Wound Type: Other (Comment) Location: Leg Location Orientation: Right;Posterior Wound Description (Comments): Rt posterior mid calf region Present on Admission: No   Dressing Type Compression wrap;Moist to moist   Dressing Changed Changed   Dressing Status Old drainage   Dressing Change Frequency Every 3 days   Site / Wound Assessment Dusky;Friable;Pale   % Wound base Red or Granulating 0%   % Wound base Yellow 100%   % Wound base Other (Comment) --  mushy   Peri-wound Assessment Denuded;Edema   Wound Length (cm) 1.5 cm   Wound Width (cm) 2.2 cm   Wound Depth (cm) 4 cm  at least therapist was concerned about going down any deeper   Undermining (cm) atl east 2.5 cm at 2 o'clock   Drainage Amount Minimal   Drainage Description Serosanguineous;No odor   Treatment Cleansed;Debridement (Selective);Packing (Saline gauze)  manual techniques to decrease lymph volume; surgical consult                 PT Education - 02/19/15 1643    Education provided Yes   Education Details importance of getting into see a surgeon for a consult   Person(s)  Educated Patient   Methods Explanation   Comprehension Verbalized understanding          PT Short Term Goals - 02/19/15 1647    PT SHORT TERM GOAL #2   Title Pt to have seen a surgeon    Time 1   Period Weeks   PT SHORT TERM GOAL #3   Title If surgeon want theapy to continue decrease depth by 2 xcm    Time 3   Period Weeks           PT Long Term Goals - 02/19/15 1648    PT LONG TERM GOAL #1   Title decrease wound size by 1.0x 1.8 x 5 cm    Time 8   Period Weeks   PT LONG TERM GOAL #2   Title Pt to be wearing compression at all times   Time 8   Period Weeks               Plan - 02/19/15 1645    Clinical Impression Statement Pt has a new wound on the posterior aspect of his leg.  I feel pt needs to  have a surgical consult as this wound is very deep and pt has Hx of blood clotts. Pt was given Dr. Arnoldo Morale name.    PT Next Visit Plan await surgical consult advice .          Problem List Patient Active Problem List   Diagnosis Date Noted  . Cellulitis of right lower extremity   . UTI (lower urinary tract infection) 02/03/2015  . Cellulitis 02/03/2015  . Blepharitis 02/03/2015  . Chronic anticoagulation 11/14/2014  . 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 Leg wound, left 08/19/2011  . DVT (deep venous thrombosis) 06/15/2011  . H/O PE (pulmonary embolism) 06/15/2011  . 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   [name] PT CLT [phone] 02/19/2015, 4:51 PM  [place] [address] [place], [place], [address] Phone: [phone]   Fax:  [phone]

## 2015-02-19 NOTE — Addendum Note (Signed)
Encounter addended by: Leeroy Cha, PT on: 02/19/2015  8:49 PM<BR>     Documentation filed: Charges VN

## 2015-02-21 ENCOUNTER — Ambulatory Visit (HOSPITAL_COMMUNITY): Payer: Medicare Other | Admitting: Physical Therapy

## 2015-02-21 ENCOUNTER — Telehealth (HOSPITAL_COMMUNITY): Payer: Self-pay | Admitting: Physical Therapy

## 2015-02-21 DIAGNOSIS — I8311 Varicose veins of right lower extremity with inflammation: Secondary | ICD-10-CM | POA: Diagnosis not present

## 2015-02-22 ENCOUNTER — Ambulatory Visit (HOSPITAL_COMMUNITY): Payer: Medicare Other | Admitting: Physical Therapy

## 2015-02-22 DIAGNOSIS — Z86711 Personal history of pulmonary embolism: Secondary | ICD-10-CM | POA: Diagnosis not present

## 2015-02-22 DIAGNOSIS — Z86718 Personal history of other venous thrombosis and embolism: Secondary | ICD-10-CM | POA: Diagnosis not present

## 2015-02-22 DIAGNOSIS — S81001D Unspecified open wound, right knee, subsequent encounter: Secondary | ICD-10-CM

## 2015-02-22 DIAGNOSIS — T148XXA Other injury of unspecified body region, initial encounter: Secondary | ICD-10-CM

## 2015-02-22 DIAGNOSIS — S91001D Unspecified open wound, right ankle, subsequent encounter: Secondary | ICD-10-CM | POA: Diagnosis not present

## 2015-02-22 DIAGNOSIS — S81801D Unspecified open wound, right lower leg, subsequent encounter: Secondary | ICD-10-CM

## 2015-02-22 NOTE — Therapy (Signed)
[place], [place], [address] Phone: [phone]   Fax:  [phone]  Wound Care Therapy  Patient Details  Name: DANE KOPKE MRN: [id number] Date of Birth: [birth year] Referring Provider:  Baird Cancer, PA-C  Encounter Date: 02/22/2015    Past Medical History  Diagnosis Date  . DVT of leg (deep venous thrombosis)     rt. leg  . Inferior vena caval thrombosis     Chronic  . DVT (deep venous thrombosis) 06/15/2011  . H/O PE (pulmonary embolism) 06/15/2011  . 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 Chronic anticoagulation 11/14/2014    No past surgical history on file.  There were no vitals filed for this visit.  Visit Diagnosis:  Nonhealing nonsurgical wound  Open wound of knee, leg (except thigh), and ankle, complicated, right, subsequent encounter      Subjective Assessment - 02/22/15 1028    Symptoms Patient arrived with dressing removed, states that his surgeon removed the dressing when he examined the wound; also states that his surgeon says he cannot do surgery on it at this time, and wound center is to continue to treat  and monitor wound, let surgeon know if it gets worse.    Currently in Pain? No/denies                   Wound Therapy - 02/22/15 1057    Wound Properties Date First Assessed: 02/06/15 Time First Assessed: 1100 Wound Type: Other (Comment) Location: Leg Location Orientation: Right;Posterior Wound Description (Comments): Rt posterior mid calf region Present on Admission: No   Dressing Type Compression wrap;Other (Comment)  Medihoney impregnated gauze   Dressing Status None  dressing had been removed by surgeon   Dressing Change Frequency Every 3 days   Site / Wound Assessment Dusky;Yellow;Other (Comment);Pale   % Wound base Red or Granulating 0%   % Wound base Yellow 100%   Peri-wound Assessment Denuded;Other (Comment)  skin rolling in at edges of wound; skin around wound dry   Drainage Amount Minimal   Drainage Description Serosanguineous;No odor   Selective Debridement - Location entire wound bed.   Selective Debridement - Tools Used Forceps;Scalpel   Selective Debridement - Tissue Removed necrotic tissue, dry skin, reduced rolling of skin at edge of wound beds   Wound Therapy - Clinical Statement Patient presents with no pain in his wound area and leg, however leg did appear generally very swollen today. Washed leg and performed debridement to wound bed; able to remove slough and provoke bleeding around edges of wound bed but had difficulty provioking bleeding towards center of wound. Noted undermining at 2 o'clock as well as deep area near center of wound that extended deeper into leg, as noted in last therapy note.  Skin beginning to roll under around edges of wound- corrected this during debridement with scalpel. Due to large presence of slough and necrotic tissue, packed wound with Medihoney imbedded gauze to assist in reducing non-viable tissue. Covered with gauze pad and proceeded to wrap leg with Proform; patient instructed to remove Proform wrapping if he starts to have pain in his leg.                    PT Short Term Goals - 02/19/15 1647    PT SHORT TERM GOAL #2   Title Pt to have seen a surgeon  Time 1   Period Weeks   PT SHORT TERM GOAL #3   Title If surgeon want theapy to continue decrease depth by 2 xcm    Time 3   Period Weeks           PT Long Term Goals - 02/19/15 1648    PT LONG TERM GOAL #1   Title decrease wound size by 1.0x 1.8 x 5 cm    Time 8   Period Weeks   PT LONG TERM GOAL #2   Title Pt to be wearing compression at all times   Time 8   Period Weeks                Problem List Patient Active Problem List   Diagnosis Date Noted  . Cellulitis of right lower extremity   . UTI (lower urinary  tract infection) 02/03/2015  . Cellulitis 02/03/2015  . Blepharitis 02/03/2015  . Chronic anticoagulation 11/14/2014  . 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 Leg wound, left 08/19/2011  . DVT (deep venous thrombosis) 06/15/2011  . H/O PE (pulmonary embolism) 06/15/2011  . 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    Deniece Ree PT, DPT [place] [address] [place], [place], [address] Phone: [phone]   Fax:  [phone]

## 2015-02-26 ENCOUNTER — Ambulatory Visit (HOSPITAL_COMMUNITY): Payer: Medicare Other | Admitting: Physical Therapy

## 2015-02-26 DIAGNOSIS — Z86711 Personal history of pulmonary embolism: Secondary | ICD-10-CM | POA: Diagnosis not present

## 2015-02-26 DIAGNOSIS — S81001D Unspecified open wound, right knee, subsequent encounter: Secondary | ICD-10-CM | POA: Diagnosis not present

## 2015-02-26 DIAGNOSIS — S91001D Unspecified open wound, right ankle, subsequent encounter: Secondary | ICD-10-CM

## 2015-02-26 DIAGNOSIS — S81002D Unspecified open wound, left knee, subsequent encounter: Secondary | ICD-10-CM

## 2015-02-26 DIAGNOSIS — Z86718 Personal history of other venous thrombosis and embolism: Secondary | ICD-10-CM | POA: Diagnosis not present

## 2015-02-26 DIAGNOSIS — S81801D Unspecified open wound, right lower leg, subsequent encounter: Secondary | ICD-10-CM

## 2015-02-26 DIAGNOSIS — S81802D Unspecified open wound, left lower leg, subsequent encounter: Secondary | ICD-10-CM

## 2015-02-26 DIAGNOSIS — T148XXA Other injury of unspecified body region, initial encounter: Secondary | ICD-10-CM

## 2015-02-26 DIAGNOSIS — S91002D Unspecified open wound, left ankle, subsequent encounter: Secondary | ICD-10-CM

## 2015-02-26 NOTE — Therapy (Signed)
[place], [place], [address] Phone: [phone]   Fax:  [phone]  Wound Care Therapy  Patient Details  Name: Matthew Wise MRN: [id number] Date of Birth: Dec 30, 1960 Referring Provider:  Redmond School, MD  Encounter Date: 02/26/2015      PT End of Session - 02/26/15 1510    Visit Number 38   Number of Visits 60   Date for PT Re-Evaluation 04/20/15   Authorization Type medicare (gcode updated on visit #30)   Authorization - Visit Number 45   Authorization - Number of Visits 40   PT Start Time 1400   PT Stop Time 1450   PT Time Calculation (min) 50 min   Activity Tolerance Patient tolerated treatment well   Behavior During Therapy [facility] for tasks assessed/performed      Past Medical History  Diagnosis Date  . DVT of leg (deep venous thrombosis)     rt. leg  . Inferior vena caval thrombosis     Chronic  . DVT (deep venous thrombosis) 06/15/2011  . H/O PE (pulmonary embolism) 06/15/2011  . 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 Chronic anticoagulation 11/14/2014    No past surgical history on file.  There were no vitals filed for this visit.  Visit Diagnosis:  Nonhealing nonsurgical wound  Open wound of knee, leg (except thigh), and ankle, complicated, right, subsequent encounter  Open wound of knee, leg (except thigh), and ankle, complicated, left, subsequent encounter                 Wound Therapy - 02/26/15 1455    Subjective Pt states his leg doesn't hurt.  States he's been compliant with compression garment.   Pain Score 0-No pain   Wound Properties Date First Assessed: 02/06/15 Time First Assessed: 1100 Wound Type: Other (Comment) Location: Leg Location Orientation: Right;Posterior Wound Description (Comments): Rt posterior mid calf region Present on Admission: No   Dressing Type  Compression wrap;Other (Comment)  Medihoney impregnated gauze   Dressing Changed Changed   Dressing Status None  dressing had been removed by surgeon   Dressing Change Frequency Every 3 days   Site / Wound Assessment Dusky;Yellow;Other (Comment);Pale   % Wound base Red or Granulating 15%   % Wound base Yellow 85%   Peri-wound Assessment Denuded;Other (Comment)  skin rolling in at edges of wound; skin around wound dry   Drainage Amount Minimal   Drainage Description Serosanguineous;No odor   Treatment Cleansed;Debridement (Selective)   Wound Properties Date First Assessed: 10/16/14 Time First Assessed: 1045 Wound Type: Other (Comment) Location: Leg Location Orientation: Right;Anterior Wound Description (Comments): Mid anterior Right Lower leg Present on Admission: No Final Assessment Date: 02/12/15 Final Assessment Time: 6283   Dressing Type Compression wrap   Dressing Changed New   Dressing Status Clean;Dry;Intact   Site / Wound Assessment Red;Pink   % Wound base Red or Granulating 95%   % Wound base Yellow 5%   Peri-wound Assessment Intact;Edema   Wound Length (cm) 1.2 cm   Wound Width (cm) 1.2 cm   Wound Depth (cm) 0 cm   Margins Attached edges (approximated)   Drainage Amount Scant   Drainage Description Serous   Treatment Cleansed;Debridement (Selective)   Selective Debridement - Location entire wound bed anterior and posterior Rt LE.   Selective Debridement - Tools Used Forceps;Scalpel   Selective Debridement - Tissue Removed  necrotic tissue, dry skin, reduced rolling of skin at edge of wound beds   Wound Therapy - Clinical Statement Pt brought compression bandages today.  Noted anterior wound is still not 100% healed as thought.  Able to remove all hardened skin from perimeter.  Dressed with medihoney gel.  Posterior wound debrided and also packed with medihoney gel.  Perimeter of wound with some heat, however no swelling or other symptoms of infection.  completed multilayer  short stretch compression bandaging following woundcare to Rt LE today.    Wound Plan Continue Wound care and compression bandaging for Rt LE.     Dressing  medihoney gel    Dressing short stretch bandages, 1/2" foam                   PT Short Term Goals - 02/19/15 1647    PT SHORT TERM GOAL #2   Title Pt to have seen a surgeon    Time 1   Period Weeks   PT SHORT TERM GOAL #3   Title If surgeon want theapy to continue decrease depth by 2 xcm    Time 3   Period Weeks           PT Long Term Goals - 02/19/15 1648    PT LONG TERM GOAL #1   Title decrease wound size by 1.0x 1.8 x 5 cm    Time 8   Period Weeks   PT LONG TERM GOAL #2   Title Pt to be wearing compression at all times   Time 8   Period Weeks                Problem List Patient Active Problem List   Diagnosis Date Noted  . Cellulitis of right lower extremity   . UTI (lower urinary tract infection) 02/03/2015  . Cellulitis 02/03/2015  . Blepharitis 02/03/2015  . Chronic anticoagulation 11/14/2014  . 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 Leg wound, left 08/19/2011  . DVT (deep venous thrombosis) 06/15/2011  . H/O PE (pulmonary embolism) 06/15/2011  . 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

## 2015-02-28 ENCOUNTER — Ambulatory Visit (HOSPITAL_COMMUNITY): Payer: Medicare Other | Admitting: Physical Therapy

## 2015-02-28 DIAGNOSIS — Z86711 Personal history of pulmonary embolism: Secondary | ICD-10-CM | POA: Diagnosis not present

## 2015-02-28 DIAGNOSIS — S91001D Unspecified open wound, right ankle, subsequent encounter: Secondary | ICD-10-CM

## 2015-02-28 DIAGNOSIS — S91002D Unspecified open wound, left ankle, subsequent encounter: Secondary | ICD-10-CM

## 2015-02-28 DIAGNOSIS — S81001D Unspecified open wound, right knee, subsequent encounter: Secondary | ICD-10-CM | POA: Diagnosis not present

## 2015-02-28 DIAGNOSIS — S81802D Unspecified open wound, left lower leg, subsequent encounter: Secondary | ICD-10-CM

## 2015-02-28 DIAGNOSIS — T148XXA Other injury of unspecified body region, initial encounter: Secondary | ICD-10-CM

## 2015-02-28 DIAGNOSIS — Z86718 Personal history of other venous thrombosis and embolism: Secondary | ICD-10-CM | POA: Diagnosis not present

## 2015-02-28 DIAGNOSIS — S81002D Unspecified open wound, left knee, subsequent encounter: Secondary | ICD-10-CM

## 2015-02-28 DIAGNOSIS — S81801D Unspecified open wound, right lower leg, subsequent encounter: Secondary | ICD-10-CM

## 2015-02-28 NOTE — Therapy (Signed)
[place], [place], [address] Phone: [phone]   Fax:  [phone]  Wound Care Therapy  Patient Details  Name: Matthew Wise MRN: [id number] Date of Birth: Oct 27, 1961 Referring Provider:  Redmond School, MD  Encounter Date: 02/28/2015      PT End of Session - 02/28/15 1637    Visit Number 39   Number of Visits 45   Date for PT Re-Evaluation 04/20/15   Authorization Type medicare (gcode updated on visit #30)   Authorization - Visit Number 50   Authorization - Number of Visits 50   PT Start Time 4920   PT Stop Time 1614   PT Time Calculation (min) 50 min      Past Medical History  Diagnosis Date  . DVT of leg (deep venous thrombosis)     rt. leg  . Inferior vena caval thrombosis     Chronic  . DVT (deep venous thrombosis) 06/15/2011  . H/O PE (pulmonary embolism) 06/15/2011  . 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 Chronic anticoagulation 11/14/2014    No past surgical history on file.  There were no vitals filed for this visit.  Visit Diagnosis:  Nonhealing nonsurgical wound  Open wound of knee, leg (except thigh), and ankle, complicated, right, subsequent encounter  Open wound of knee, leg (except thigh), and ankle, complicated, left, subsequent encounter                 Wound Therapy - 02/28/15 1615    Subjective Pt states he only has pain if he bumps the back of his leg.   Patient and Family Stated Goals wound to heal   Date of Onset 02/06/15   Pain Score 0-No pain   Wound Properties Date First Assessed: 02/06/15 Time First Assessed: 1100 Wound Type: Other (Comment) Location: Leg Location Orientation: Right;Posterior Wound Description (Comments): Rt posterior mid calf region Present on Admission: No   Dressing Type Compression wrap;Other (Comment)  wound packed with moist to most 2" kling  using hydrogel   Dressing Changed New   Dressing Status None   Dressing Change Frequency Every 3 days   Site / Wound Assessment Dusky;Yellow;Other (Comment);Pale   % Wound base Red or Granulating 5%  nonviable tissue at wound bed    % Wound base Yellow 95%   Peri-wound Assessment Denuded;Other (Comment)  skin rolling in at edges of wound; skin around wound dry   Wound Length (cm) 1.5 cm   Wound Width (cm) 2.2 cm   Wound Depth (cm) 4 cm  this is a minimal depth   Undermining (cm) from 2 to 3 oclock 2.0 cm    Drainage Amount Minimal   Drainage Description Sanguineous;No odor   Treatment Cleansed;Debridement (Selective);Packing (Saline gauze);Other (Comment)  multilayer compression dressing using foam/short stretch.   Wound Properties Date First Assessed: 10/16/14 Time First Assessed: 1045 Wound Type: Other (Comment) Location: Leg Location Orientation: Right;Anterior Wound Description (Comments): Mid anterior Right Lower leg Present on Admission: No Final Assessment Date: 02/12/15 Final Assessment Time: 1015   Dressing Type Healed  No treatment needed to anterior wound   Dressing Status    Site / Wound Assessment    % Wound base Red or Granulating --   % Wound base Yellow --   Peri-wound Assessment --   Margins --   Drainage Amount --   Drainage Description --  Selective Debridement - Location entire wound bed of posterior wound   Selective Debridement - Tools Used Forceps;Scissors   Selective Debridement - Tissue Removed necrotic tissue, dry skin, reduced rolling of skin at edge of wound beds   Wound Therapy - Clinical Statement Anterior wound is healed but need pigmentation.  Pt continues to have significant swelling.  Posterior wound is deep; wound was packed prior to compression bandages being applied.  Pt was urged to keep the bandages on as long as he could.     Hydrotherapy Plan Debridement;Dressing change;Patient/family education   Wound Plan Continue Wound care and  compression bandaging for Rt LE.Marland Kitchen  Pt may benefit from pulse lavage.      Dressing  hydrogel impregnated kling packed into wound bed followed by short stretch multilayer bandaging with foam.    Dressing short stretch bandages, 1/2" foam   Manual Therapy Pt recieved manual lymph drainage to decrease swelling including supraclavicular, deep and superficial abdominal and routing fluid using inguinal/axillary pathways both anterior and posteriorly.    Decrease Necrotic Tissue to STG:  Pt to see surgeion 3 weeks    Decrease Necrotic Tissue - Progress Met   Increase Granulation Tissue to STG: 2- 3weeks if surgeion want pt to continue decrese depth of wound by 2 cm    Increase Granulation Tissue - Progress Mot progressing   Decrease Length/Width/Depth by (cm) LTG 1:  8 weeks - Pt wound to be decreased by 1x1.8x5 cm   Decrease Length/Width/Depth - Progress Not progressing   Patient/Family will be able to  LTG 2:  Have compression on at all times to decrese swelling   Patient/Family Instruction Goal - Progress Progressing toward goal   Goals/treatment plan/discharge plan were made with and agreed upon by patient/family Yes   Time For Goal Achievement Other (comment)   Wound Therapy - Potential for Goals Good                 PT Education - 02/28/15 1636    Education provided Yes   Education Details to keep short stretch bandages on as long as possible    Person(s) Educated Patient   Methods Explanation   Comprehension Verbalized understanding          PT Short Term Goals - 02/19/15 1647    PT SHORT TERM GOAL #2   Title Pt to have seen a surgeon    Time 1   Period Weeks   PT SHORT TERM GOAL #3   Title If surgeon want theapy to continue decrease depth by 2 xcm    Time 3   Period Weeks           PT Long Term Goals - 02/19/15 1648    PT LONG TERM GOAL #1   Title decrease wound size by 1.0x 1.8 x 5 cm    Time 8   Period Weeks   PT LONG TERM GOAL #2   Title Pt to be wearing  compression at all times   Time 8   Period Weeks               Plan - 02/28/15 1637    Clinical Impression Statement Able to debride more tissue today than in previous treatments.  Wound still has significant depth.    PT Next Visit Plan update g-code next visit.         Problem List Patient Active Problem List   Diagnosis Date Noted  . Cellulitis of right lower extremity   .  UTI (lower urinary tract infection) 02/03/2015  . Cellulitis 02/03/2015  . Blepharitis 02/03/2015  . Chronic anticoagulation 11/14/2014  . 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 Leg wound, left 08/19/2011  . DVT (deep venous thrombosis) 06/15/2011  . H/O PE (pulmonary embolism) 06/15/2011  . 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 [place], [place], [address] Phone: [phone]   Fax:  [phone]

## 2015-03-05 ENCOUNTER — Ambulatory Visit (HOSPITAL_COMMUNITY): Payer: Medicare Other | Attending: Hematology and Oncology | Admitting: Physical Therapy

## 2015-03-05 DIAGNOSIS — S81801D Unspecified open wound, right lower leg, subsequent encounter: Secondary | ICD-10-CM

## 2015-03-05 DIAGNOSIS — S91001D Unspecified open wound, right ankle, subsequent encounter: Secondary | ICD-10-CM | POA: Insufficient documentation

## 2015-03-05 DIAGNOSIS — Z86711 Personal history of pulmonary embolism: Secondary | ICD-10-CM | POA: Diagnosis not present

## 2015-03-05 DIAGNOSIS — S81002D Unspecified open wound, left knee, subsequent encounter: Secondary | ICD-10-CM

## 2015-03-05 DIAGNOSIS — S81802D Unspecified open wound, left lower leg, subsequent encounter: Secondary | ICD-10-CM

## 2015-03-05 DIAGNOSIS — S91002D Unspecified open wound, left ankle, subsequent encounter: Secondary | ICD-10-CM

## 2015-03-05 DIAGNOSIS — S81001D Unspecified open wound, right knee, subsequent encounter: Secondary | ICD-10-CM | POA: Diagnosis not present

## 2015-03-05 DIAGNOSIS — T148XXA Other injury of unspecified body region, initial encounter: Secondary | ICD-10-CM

## 2015-03-05 NOTE — Therapy (Addendum)
[place], [place], [address] Phone: [phone]   Fax:  [phone]  Wound Care Therapy  Patient Details  Name: Matthew Wise MRN: [id number] Date of Birth: 11/18/1961 Referring Provider:  Redmond School, MD  Encounter Date: 03/05/2015      PT End of Session - 03/05/15 1741    Visit Number 40   Number of Visits 21   Date for PT Re-Evaluation 04/20/15   Authorization Type medicare (gcode updated on visit #40)   Authorization - Visit Number 34   Authorization - Number of Visits 50   PT Start Time 0175   PT Stop Time 1650   PT Time Calculation (min) 42 min   Activity Tolerance Patient tolerated treatment well   Behavior During Therapy [facility] for tasks assessed/performed      Past Medical History  Diagnosis Date  . DVT of leg (deep venous thrombosis)     rt. leg  . Inferior vena caval thrombosis     Chronic  . DVT (deep venous thrombosis) 06/15/2011  . H/O PE (pulmonary embolism) 06/15/2011  . 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 Chronic anticoagulation 11/14/2014    No past surgical history on file.  There were no vitals filed for this visit.  Visit Diagnosis:  Nonhealing nonsurgical wound  Open wound of knee, leg (except thigh), and ankle, complicated, right, subsequent encounter  Open wound of knee, leg (except thigh), and ankle, complicated, left, subsequent encounter                 Wound Therapy - 03/05/15 1709    Subjective Pt states it is beginning to itch a little around his wound   Patient and Family Stated Goals wound to heal   Date of Onset 02/06/15   Pain Score 0-No pain   Wound Properties Date First Assessed: 02/06/15 Time First Assessed: 1100 Wound Type: Other (Comment) Location: Leg Location Orientation: Right;Posterior Wound Description (Comments): Rt posterior mid calf region  Present on Admission: No   Dressing Type Compression wrap;Other (Comment)  wound packed with moist to most 2" kling using hydrogel   Dressing Changed Changed   Dressing Status None   Dressing Change Frequency Every 3 days   Site / Wound Assessment Dusky;Yellow;Other (Comment);Pale   % Wound base Red or Granulating 40%  nonviable tissue at wound bed    % Wound base Yellow 60%   Peri-wound Assessment Denuded;Other (Comment)  skin rolling in at edges of wound; skin around wound dry   Wound Length (cm) 1.3 cm   Wound Width (cm) 2 cm   Wound Depth (cm) 1.5 cm  at 12:00   Drainage Amount Minimal   Drainage Description Sanguineous;No odor   Treatment Cleansed;Debridement (Selective)   Selective Debridement - Location entire wound bed    Selective Debridement - Tools Used Forceps;Scissors   Selective Debridement - Tissue Removed necrotic tissue, dry skin, reduced rolling of skin at edge of wound beds   Wound Therapy - Clinical Statement Removed remaining dry skin from anterior wound with 100% healed beneath.  completed this wound on flow sheet.  Posterior wound is granulating and filling in well.  New measurements reveal reduction of 0.2-0.5 in perimeter and 2.5 cm reduction in overall depth.  Pt also with less induration around wound site.  Continued with manual to promote fluid removal.    Hydrotherapy Plan Debridement;Dressing  change;Patient/family education   Wound Plan Continue Wound care and compression bandaging for Rt LE.   Dressing  medihoney gel impregnated conform packed into woundbed, 4X4 and ABD pad, #7 lymph netting   Dressing short stretch bandages, 1/2" foam   Manual Therapy Pt received short manual lymph drainage to decrease swelling including supraclavicular, deep/superficial abdominal and routing fluid using inguinal/axillary pathways both anterior and posteriorly.    Decrease Necrotic Tissue to STG:  Pt to see surgeon 3 weeks    Decrease Necrotic Tissue - Progress Met    Increase Granulation Tissue to STG: 2- 3weeks.  Decrease depth of wound by 2 cm    Increase Granulation Tissue - Progress Progressing toward goal   Decrease Length/Width/Depth by (cm) LTG 1:  8 weeks - Pt wound to be decreased by 1x1.8x5 cm   Decrease Length/Width/Depth - Progress Progressing toward goal   Patient/Family will be able to  LTG 2:  Have compression on at all times to decrese swelling   Patient/Family Instruction Goal - Progress Progressing toward goal   Goals/treatment plan/discharge plan were made with and agreed upon by patient/family Yes   Time For Goal Achievement Other (comment)   Wound Therapy - Potential for Goals Good                   PT Short Term Goals - 02/19/15 1647    PT SHORT TERM GOAL #2   Title Pt to have seen a surgeon    Time 1   Period Weeks   PT SHORT TERM GOAL #3   Title If surgeon want theapy to continue decrease depth by 2 xcm    Time 3   Period Weeks           PT Long Term Goals - 02/19/15 1648    PT LONG TERM GOAL #1   Title decrease wound size by 1.0x 1.8 x 5 cm    Time 8   Period Weeks   PT LONG TERM GOAL #2   Title Pt to be wearing compression at all times   Time 8   Period Weeks                 G-Codes - 03/29/2015 1415    Functional Assessment Tool Used clinical judgement    Functional Limitation Other PT primary   Other PT Primary Current Status (L3903) At least 60 percent but less than 80 percent impaired, limited or restricted   Other PT Primary Goal Status (E0923) At least 20 percent but less than 40 percent impaired, limited or restricted       Problem List Patient Active Problem List   Diagnosis Date Noted  . Cellulitis of right lower extremity   . UTI (lower urinary tract infection) 02/03/2015  . Cellulitis 02/03/2015  . Blepharitis 02/03/2015  . Chronic anticoagulation 11/14/2014  . 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 Leg wound, left 08/19/2011   . DVT (deep venous thrombosis) 06/15/2011  . H/O PE (pulmonary embolism) 06/15/2011  . 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

## 2015-03-07 ENCOUNTER — Ambulatory Visit (HOSPITAL_COMMUNITY): Payer: Medicare Other | Admitting: Physical Therapy

## 2015-03-07 DIAGNOSIS — S81001D Unspecified open wound, right knee, subsequent encounter: Secondary | ICD-10-CM | POA: Diagnosis not present

## 2015-03-07 DIAGNOSIS — S91001D Unspecified open wound, right ankle, subsequent encounter: Secondary | ICD-10-CM | POA: Diagnosis not present

## 2015-03-07 DIAGNOSIS — S81002D Unspecified open wound, left knee, subsequent encounter: Secondary | ICD-10-CM

## 2015-03-07 DIAGNOSIS — S91002D Unspecified open wound, left ankle, subsequent encounter: Secondary | ICD-10-CM

## 2015-03-07 DIAGNOSIS — T148XXA Other injury of unspecified body region, initial encounter: Secondary | ICD-10-CM

## 2015-03-07 DIAGNOSIS — Z86711 Personal history of pulmonary embolism: Secondary | ICD-10-CM | POA: Diagnosis not present

## 2015-03-07 DIAGNOSIS — S81801D Unspecified open wound, right lower leg, subsequent encounter: Secondary | ICD-10-CM

## 2015-03-07 DIAGNOSIS — S81802D Unspecified open wound, left lower leg, subsequent encounter: Secondary | ICD-10-CM

## 2015-03-07 NOTE — Therapy (Signed)
[place], [place], [address] Phone: [phone]   Fax:  [phone]  Wound Care Therapy  Patient Details  Name: Matthew Wise MRN: [id number] Date of Birth: 01/02/1961 Referring Provider:  Redmond School, MD  Encounter Date: 03/07/2015      PT End of Session - 03/07/15 1816    Visit Number 42   Number of Visits 30   Date for PT Re-Evaluation 04/20/15   Authorization Type medicare (gcode updated on visit #40)   Authorization - Visit Number 57   Authorization - Number of Visits 50   PT Start Time 1400   PT Stop Time 1438   PT Time Calculation (min) 38 min   Activity Tolerance Patient tolerated treatment well   Behavior During Therapy [facility] for tasks assessed/performed      Past Medical History  Diagnosis Date  . DVT of leg (deep venous thrombosis)     rt. leg  . Inferior vena caval thrombosis     Chronic  . DVT (deep venous thrombosis) 06/15/2011  . H/O PE (pulmonary embolism) 06/15/2011  . 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 Chronic anticoagulation 11/14/2014    No past surgical history on file.  There were no vitals filed for this visit.  Visit Diagnosis:  Nonhealing nonsurgical wound  Open wound of knee, leg (except thigh), and ankle, complicated, right, subsequent encounter  Open wound of knee, leg (except thigh), and ankle, complicated, left, subsequent encounter                 Wound Therapy - 03/07/15 1649    Subjective Pt reports no pain today.   Patient and Family Stated Goals wound to heal   Date of Onset 02/06/15   Wound Properties Date First Assessed: 02/06/15 Time First Assessed: 1100 Wound Type: Other (Comment) Location: Leg Location Orientation: Right;Posterior Wound Description (Comments): Rt posterior mid calf region Present on Admission: No   Dressing Type Compression  wrap;Other (Comment)  wound packed with moist to most 2" kling using hydrogel   Dressing Changed Changed   Dressing Status None   Dressing Change Frequency Every 3 days   Site / Wound Assessment Dusky;Yellow;Other (Comment);Pale   % Wound base Red or Granulating 50%   % Wound base Yellow 50%   Peri-wound Assessment Other (Comment);Intact;Edema;Induration   Drainage Amount Minimal   Drainage Description Sanguineous;No odor   Treatment Cleansed;Debridement (Selective)   Selective Debridement - Location entire wound bed    Selective Debridement - Tools Used Forceps   Selective Debridement - Tissue Removed necrotic tissue, dry skin   Wound Therapy - Clinical Statement Wound continues to heal well with overall less induration.  Wound with increased granualtion.  Packed wound well with medihoney and continued with short stretch bandaging.     Hydrotherapy Plan Debridement;Dressing change;Patient/family education   Wound Plan Continue Wound care and compression bandaging for Rt LE.   Dressing  medihoney gel impregnated conform packed into woundbed, 4X4 and ABD pad, #7 lymph netting   Dressing short stretch bandages, 1/2" foam   Manual Therapy Pt received short manual lymph drainage to decrease swelling including supraclavicular, deep/superficial abdominal and routing fluid using inguinal/axillary pathways both anterior and posteriorly.    Decrease Necrotic Tissue to STG:  Pt to see surgeon 3 weeks    Increase Granulation Tissue to STG: 2- 3weeks.  Decrease depth of wound  by 2 cm    Decrease Length/Width/Depth by (cm) LTG 1:  8 weeks - Pt wound to be decreased by 1x1.8x5 cm   Patient/Family will be able to  LTG 2:  Have compression on at all times to decrese swelling   Goals/treatment plan/discharge plan were made with and agreed upon by patient/family Yes   Time For Goal Achievement Other (comment)   Wound Therapy - Potential for Goals Good                   PT Short Term Goals -  02/19/15 1647    PT SHORT TERM GOAL #2   Title Pt to have seen a surgeon    Time 1   Period Weeks   PT SHORT TERM GOAL #3   Title If surgeon want theapy to continue decrease depth by 2 xcm    Time 3   Period Weeks           PT Long Term Goals - 02/19/15 1648    PT LONG TERM GOAL #1   Title decrease wound size by 1.0x 1.8 x 5 cm    Time 8   Period Weeks   PT LONG TERM GOAL #2   Title Pt to be wearing compression at all times   Time 8   Period Weeks              Problem List Patient Active Problem List   Diagnosis Date Noted  . Cellulitis of right lower extremity   . UTI (lower urinary tract infection) 02/03/2015  . Cellulitis 02/03/2015  . Blepharitis 02/03/2015  . Chronic anticoagulation 11/14/2014  . 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 Leg wound, left 08/19/2011  . DVT (deep venous thrombosis) 06/15/2011  . H/O PE (pulmonary embolism) 06/15/2011  . 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 [place], [place], [address] Phone: [phone]   Fax:  [phone]

## 2015-03-14 ENCOUNTER — Ambulatory Visit (HOSPITAL_COMMUNITY): Payer: Medicare Other | Admitting: Physical Therapy

## 2015-03-14 DIAGNOSIS — S81802D Unspecified open wound, left lower leg, subsequent encounter: Secondary | ICD-10-CM

## 2015-03-14 DIAGNOSIS — S91001D Unspecified open wound, right ankle, subsequent encounter: Secondary | ICD-10-CM

## 2015-03-14 DIAGNOSIS — S91002D Unspecified open wound, left ankle, subsequent encounter: Secondary | ICD-10-CM

## 2015-03-14 DIAGNOSIS — S81801D Unspecified open wound, right lower leg, subsequent encounter: Secondary | ICD-10-CM

## 2015-03-14 DIAGNOSIS — S81001D Unspecified open wound, right knee, subsequent encounter: Secondary | ICD-10-CM

## 2015-03-14 DIAGNOSIS — T148XXA Other injury of unspecified body region, initial encounter: Secondary | ICD-10-CM

## 2015-03-14 DIAGNOSIS — Z86711 Personal history of pulmonary embolism: Secondary | ICD-10-CM | POA: Diagnosis not present

## 2015-03-14 DIAGNOSIS — S81002D Unspecified open wound, left knee, subsequent encounter: Secondary | ICD-10-CM

## 2015-03-14 NOTE — Therapy (Signed)
[place], [place], [address] Phone: [phone]   Fax:  [phone]  Wound Care Therapy  Patient Details  Name: Matthew Wise MRN: [id number] Date of Birth: [birth year] Referring Provider:  Redmond School, MD  Encounter Date: 03/14/2015      PT End of Session - 03/14/15 1647    Visit Number 43   Number of Visits 23   Date for PT Re-Evaluation 04/20/15   Authorization Type medicare (gcode updated on visit #40)   Authorization - Visit Number 27   Authorization - Number of Visits 50   PT Start Time 1497   PT Stop Time 1150   PT Time Calculation (min) 45 min      Past Medical History  Diagnosis Date  . DVT of leg (deep venous thrombosis)     rt. leg  . Inferior vena caval thrombosis     Chronic  . DVT (deep venous thrombosis) 06/15/2011  . H/O PE (pulmonary embolism) 06/15/2011  . 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 Chronic anticoagulation 11/14/2014    No past surgical history on file.  There were no vitals filed for this visit.  Visit Diagnosis:  Nonhealing nonsurgical wound  Open wound of knee, leg (except thigh), and ankle, complicated, right, subsequent encounter  Open wound of knee, leg (except thigh), and ankle, complicated, left, subsequent encounter                 Wound Therapy - 03/14/15 1638    Subjective Pt reports no pain today.   Patient and Family Stated Goals wound to heal   Date of Onset 02/06/15   Pain Score 0-No pain   Wound Properties Date First Assessed: 02/06/15 Time First Assessed: 1100 Wound Type: Other (Comment) Location: Leg Location Orientation: Right;Posterior Wound Description (Comments): Rt posterior mid calf region Present on Admission: No   Dressing Type Compression wrap  wound packed with moist to most 2" kling using hydrogel   Dressing Changed New  PT had  taken bandages off.    Dressing Status None   Dressing Change Frequency Every 3 days   Site / Wound Assessment Red   % Wound base Red or Granulating 90%   % Wound base Yellow 10%   Peri-wound Assessment Other (Comment);Intact;Edema;Induration  decreased edema noted today   Wound Length (cm) 1 cm   Wound Width (cm) 1.6 cm   Wound Depth (cm) 1.5 cm   Tunneling (cm) superiorly 1.5 cm    Drainage Amount Minimal   Drainage Description Sanguineous;No odor   Treatment Cleansed;Other (Comment)  manual lymphedema techniques used including supraclavicular; deep and superficial abdominal and routing fluid using inguinal/axillary pathway both anterior and posteriorly.   Selective Debridement - Location none   Wound Therapy - Clinical Statement Wound continues to heal well with overall less induration.  Wound with increased granualtion.  Packed wound well with 2x2  continued with short stretch bandaging.     Hydrotherapy Plan Other (comment)  complete decongestive therapy techniques with manual/comress   Wound Plan Continue Wound care and compression bandaging for Rt LE.   Dressing  moistened 2x2 with Carraklenz   Dressing short stretch bandages, 1/2" foam   Manual Therapy Pt received short manual lymph drainage to decrease swelling including supraclavicular, deep/superficial abdominal and routing fluid using inguinal/axillary pathways both anterior and posteriorly.    Decrease Necrotic Tissue to  STG:  Pt to see surgeon 3 weeks    Decrease Necrotic Tissue - Progress Met   Increase Granulation Tissue to STG: 2- 3weeks.  Decrease depth of wound by 2 cm    Increase Granulation Tissue - Progress Progressing toward goal   Decrease Length/Width/Depth by (cm) LTG 1:  8 weeks - Pt wound to be decreased by 1x1.8x5 cm   Decrease Length/Width/Depth - Progress Progressing toward goal   Patient/Family will be able to  LTG 2:  Have compression on at all times to decrese swelling   Patient/Family Instruction Goal  - Progress Progressing toward goal   Goals/treatment plan/discharge plan were made with and agreed upon by patient/family Yes   Time For Goal Achievement Other (comment)   Wound Therapy - Potential for Goals Good                   PT Short Term Goals - 02/19/15 1647    PT SHORT TERM GOAL #2   Title Pt to have seen a surgeon    Time 1   Period Weeks   PT SHORT TERM GOAL #3   Title If surgeon want theapy to continue decrease depth by 2 xcm    Time 3   Period Weeks           PT Long Term Goals - 02/19/15 1648    PT LONG TERM GOAL #1   Title decrease wound size by 1.0x 1.8 x 5 cm    Time 8   Period Weeks   PT LONG TERM GOAL #2   Title Pt to be wearing compression at all times   Time 8   Period Weeks         Problem List Patient Active Problem List   Diagnosis Date Noted  . Cellulitis of right lower extremity   . UTI (lower urinary tract infection) 02/03/2015  . Cellulitis 02/03/2015  . Blepharitis 02/03/2015  . Chronic anticoagulation 11/14/2014  . 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 Leg wound, left 08/19/2011  . DVT (deep venous thrombosis) 06/15/2011  . H/O PE (pulmonary embolism) 06/15/2011  . 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 [place], [place], [address] Phone: [phone]   Fax:  [phone]

## 2015-03-18 ENCOUNTER — Ambulatory Visit (HOSPITAL_COMMUNITY): Payer: Medicare Other | Admitting: Physical Therapy

## 2015-03-20 ENCOUNTER — Ambulatory Visit (HOSPITAL_COMMUNITY): Payer: Medicare Other | Admitting: Physical Therapy

## 2015-03-20 DIAGNOSIS — S81001D Unspecified open wound, right knee, subsequent encounter: Secondary | ICD-10-CM

## 2015-03-20 DIAGNOSIS — Z86711 Personal history of pulmonary embolism: Secondary | ICD-10-CM | POA: Diagnosis not present

## 2015-03-20 DIAGNOSIS — S91001D Unspecified open wound, right ankle, subsequent encounter: Secondary | ICD-10-CM | POA: Diagnosis not present

## 2015-03-20 DIAGNOSIS — S91002D Unspecified open wound, left ankle, subsequent encounter: Secondary | ICD-10-CM

## 2015-03-20 DIAGNOSIS — S81801D Unspecified open wound, right lower leg, subsequent encounter: Secondary | ICD-10-CM

## 2015-03-20 DIAGNOSIS — S81802D Unspecified open wound, left lower leg, subsequent encounter: Secondary | ICD-10-CM

## 2015-03-20 DIAGNOSIS — T148XXA Other injury of unspecified body region, initial encounter: Secondary | ICD-10-CM

## 2015-03-20 DIAGNOSIS — S81002D Unspecified open wound, left knee, subsequent encounter: Secondary | ICD-10-CM

## 2015-03-20 NOTE — Therapy (Signed)
[name], [place], [address] Phone: [phone]   Fax:  [phone]  Wound Care Therapy  Patient Details  Name: Matthew Wise MRN: [id number] Date of Birth: 1961/05/28 Referring Provider:  Redmond School, MD  Encounter Date: 03/20/2015      PT End of Session - 03/20/15 1800    Visit Number 59   Number of Visits 65   Date for PT Re-Evaluation 04/20/15   Authorization Type medicare (gcode updated on visit #40)   Authorization - Visit Number 37   Authorization - Number of Visits 50   PT Start Time 1350   PT Stop Time 1430   PT Time Calculation (min) 40 min      Past Medical History  Diagnosis Date  . DVT of leg (deep venous thrombosis)     rt. leg  . Inferior vena caval thrombosis     Chronic  . DVT (deep venous thrombosis) 06/15/2011  . H/O PE (pulmonary embolism) 06/15/2011  . 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 Chronic anticoagulation 11/14/2014    No past surgical history on file.  There were no vitals filed for this visit.  Visit Diagnosis:  Nonhealing nonsurgical wound  Open wound of knee, leg (except thigh), and ankle, complicated, right, subsequent encounter  Open wound of knee, leg (except thigh), and ankle, complicated, left, subsequent encounter                 Wound Therapy - 03/20/15 1752    Subjective Pt states he did not come to treatment due to not feeling well on Monday but feels better today.   Patient and Family Stated Goals wound to heal   Date of Onset 02/06/15   Pain Score 0-No pain   Wound Properties Date First Assessed: 02/06/15 Time First Assessed: 1100 Wound Type: Other (Comment) Location: Leg Location Orientation: Right;Posterior Wound Description (Comments): Rt posterior mid calf region Present on Admission: No   Dressing Type --  Pt comes to department with no  dressing on LE   Dressing Changed New   Dressing Status None   Dressing Change Frequency Every 3 days   Site / Wound Assessment Dusky;Friable   % Wound base Red or Granulating 90%   % Wound base Yellow 10%   Peri-wound Assessment Other (Comment);Intact;Edema;Induration  decreased edema noted today   Wound Length (cm) 1 cm   Wound Width (cm) 1.5 cm   Wound Depth (cm) 1.4 cm   Tunneling (cm) --  superior/laterally at least 2.0cm   Drainage Amount None   Treatment Cleansed;Debridement (Selective);Other (Comment)  very minimal debridment needed/focus on manual    Selective Debridement - Location wound bed and edges   Selective Debridement - Tools Used Forceps   Selective Debridement - Tissue Removed nonviable tissue    Wound Therapy - Clinical Statement Wound continues to heal well but now has a dusky look vs. red.   overall less induration.   Packed wound well with 2x2  continued with short stretch bandaging.     Hydrotherapy Plan Other (comment)  complete decongestive therapy techniques with manual/comress   Wound Plan Continue Wound care and compression bandaging for Rt LE.   Dressing  moistened 2x2 with Carraklenz   Dressing short stretch bandages, 1/2" foam   Manual Therapy Pt received short manual lymph drainage to decrease swelling including supraclavicular, deep/superficial abdominal and routing  fluid using inguinal/axillary pathways both anterior and posteriorly.    Decrease Necrotic Tissue to STG:  Pt to see surgeon 3 weeks    Decrease Necrotic Tissue - Progress Progressing toward goal   Increase Granulation Tissue to STG: 2- 3weeks.  Decrease depth of wound by 2 cm    Increase Granulation Tissue - Progress Progressing toward goal   Decrease Length/Width/Depth by (cm) LTG 1:  8 weeks - Pt wound to be decreased by 1x1.8x5 cm   Decrease Length/Width/Depth - Progress Progressing toward goal   Patient/Family will be able to  LTG 2:  Have compression on at all times to decrese  swelling   Patient/Family Instruction Goal - Progress Progressing toward goal   Goals/treatment plan/discharge plan were made with and agreed upon by patient/family Yes   Time For Goal Achievement Other (comment)   Wound Therapy - Potential for Goals Good                   PT Short Term Goals - 02/19/15 1647    PT SHORT TERM GOAL #2   Title Pt to have seen a surgeon    Time 1   Period Weeks   PT SHORT TERM GOAL #3   Title If surgeon want theapy to continue decrease depth by 2 xcm    Time 3   Period Weeks           PT Long Term Goals - 02/19/15 1648    PT LONG TERM GOAL #1   Title decrease wound size by 1.0x 1.8 x 5 cm    Time 8   Period Weeks   PT LONG TERM GOAL #2   Title Pt to be wearing compression at all times   Time 8   Period Weeks          Problem List Patient Active Problem List   Diagnosis Date Noted  . Cellulitis of right lower extremity   . UTI (lower urinary tract infection) 02/03/2015  . Cellulitis 02/03/2015  . Blepharitis 02/03/2015  . Chronic anticoagulation 11/14/2014  . 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 Leg wound, left 08/19/2011  . DVT (deep venous thrombosis) 06/15/2011  . H/O PE (pulmonary embolism) 06/15/2011  . 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

## 2015-03-25 ENCOUNTER — Ambulatory Visit (HOSPITAL_COMMUNITY): Payer: Medicare Other | Admitting: Physical Therapy

## 2015-03-25 DIAGNOSIS — S91001D Unspecified open wound, right ankle, subsequent encounter: Secondary | ICD-10-CM | POA: Diagnosis not present

## 2015-03-25 DIAGNOSIS — S91002D Unspecified open wound, left ankle, subsequent encounter: Secondary | ICD-10-CM

## 2015-03-25 DIAGNOSIS — T148XXA Other injury of unspecified body region, initial encounter: Secondary | ICD-10-CM

## 2015-03-25 DIAGNOSIS — S81001D Unspecified open wound, right knee, subsequent encounter: Secondary | ICD-10-CM | POA: Diagnosis not present

## 2015-03-25 DIAGNOSIS — S81802D Unspecified open wound, left lower leg, subsequent encounter: Secondary | ICD-10-CM

## 2015-03-25 DIAGNOSIS — S81002D Unspecified open wound, left knee, subsequent encounter: Secondary | ICD-10-CM

## 2015-03-25 DIAGNOSIS — S81801D Unspecified open wound, right lower leg, subsequent encounter: Secondary | ICD-10-CM

## 2015-03-25 DIAGNOSIS — Z86711 Personal history of pulmonary embolism: Secondary | ICD-10-CM | POA: Diagnosis not present

## 2015-03-25 NOTE — Therapy (Signed)
[place], [place], [address] Phone: [phone]   Fax:  [phone]  Wound Care Therapy  Patient Details  Name: Matthew Wise MRN: [id number] Date of Birth: 1961-08-04 Referring Provider:  Redmond School, MD  Encounter Date: 03/25/2015      PT End of Session - 03/25/15 1324    Visit Number 45   Number of Visits 30   Date for PT Re-Evaluation 04/20/15   Authorization Type medicare (gcode updated on visit #40)   Authorization - Visit Number 38   Authorization - Number of Visits 50   PT Start Time 0930   PT Stop Time 1015   PT Time Calculation (min) 45 min      Past Medical History  Diagnosis Date  . DVT of leg (deep venous thrombosis)     rt. leg  . Inferior vena caval thrombosis     Chronic  . DVT (deep venous thrombosis) 06/15/2011  . H/O PE (pulmonary embolism) 06/15/2011  . 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 Chronic anticoagulation 11/14/2014    No past surgical history on file.  There were no vitals filed for this visit.  Visit Diagnosis:  Nonhealing nonsurgical wound  Open wound of knee, leg (except thigh), and ankle, complicated, right, subsequent encounter  Open wound of knee, leg (except thigh), and ankle, complicated, left, subsequent encounter                 Wound Therapy - 03/25/15 1316    Subjective Pt has no complaints    Patient and Family Stated Goals wound to heal   Date of Onset 02/06/15   Pain Score 0-No pain   Wound Properties Date First Assessed: 02/06/15 Time First Assessed: 1100 Wound Type: Other (Comment) Location: Leg Location Orientation: Right;Posterior Wound Description (Comments): Rt posterior mid calf region Present on Admission: No   Dressing Type --  Compression garment on LE but no dressing on wound   Dressing Changed New   Dressing Status None   Dressing Change Frequency Every 3 days   Site / Wound Assessment Dusky   % Wound base Red or Granulating 95%   % Wound base Yellow 5%   Peri-wound Assessment Other (Comment);Intact;Edema;Induration  decreased edema noted today   Wound Length (cm) 0.9 cm   Wound Width (cm) 1.5 cm   Wound Depth (cm) 1 cm   Tunneling (cm) superiorly .6; sup/laterally .6; inferiorly.2cm   Drainage Amount Scant   Drainage Description Sanguineous   Treatment Cleansed;Debridement (Selective)  manual decongestive techniques.    Selective Debridement - Location wound bed and edges   Selective Debridement - Tools Used Forceps   Selective Debridement - Tissue Removed nonviable tissue    Wound Therapy - Clinical Statement Wound continues to decrease in depth and tunneling.  Wound bed appears healthier not quite as dusky.     Hydrotherapy Plan Other (comment)  complete decongestive therapy techniques with manual/comress   Wound Plan Continue Wound care and compression bandaging for Rt LE.   Dressing  moistened 2x2 with Carraklenz   Dressing short stretch bandages, 1/2" foam   Manual Therapy Pt received short manual lymph drainage to decrease swelling including supraclavicular, deep/superficial abdominal and routing fluid using inguinal/axillary pathways both anterior and posteriorly.    Decrease Necrotic Tissue to STG:  Pt to see surgeon 3 weeks    Decrease Necrotic Tissue -  Progress Progressing toward goal   Increase Granulation Tissue to STG: 2- 3weeks.  Decrease depth of wound by 2 cm    Increase Granulation Tissue - Progress Progressing toward goal   Decrease Length/Width/Depth by (cm) LTG 1:  8 weeks - Pt wound to be decreased by 1x1.8x5 cm   Decrease Length/Width/Depth - Progress Progressing toward goal   Patient/Family will be able to  LTG 2:  Have compression on at all times to decrese swelling   Patient/Family Instruction Goal - Progress Progressing toward goal   Goals/treatment plan/discharge plan were  made with and agreed upon by patient/family Yes   Time For Goal Achievement Other (comment)   Wound Therapy - Potential for Goals Good                   PT Short Term Goals - 02/19/15 1647    PT SHORT TERM GOAL #2   Title Pt to have seen a surgeon    Time 1   Period Weeks   PT SHORT TERM GOAL #3   Title If surgeon want theapy to continue decrease depth by 2 xcm    Time 3   Period Weeks           PT Long Term Goals - 02/19/15 1648    PT LONG TERM GOAL #1   Title decrease wound size by 1.0x 1.8 x 5 cm    Time 8   Period Weeks   PT LONG TERM GOAL #2   Title Pt to be wearing compression at all times   Time 8   Period Weeks                Problem List Patient Active Problem List   Diagnosis Date Noted  . Cellulitis of right lower extremity   . UTI (lower urinary tract infection) 02/03/2015  . Cellulitis 02/03/2015  . Blepharitis 02/03/2015  . Chronic anticoagulation 11/14/2014  . 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 Leg wound, left 08/19/2011  . DVT (deep venous thrombosis) 06/15/2011  . H/O PE (pulmonary embolism) 06/15/2011  . 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 [place], [place], [address] Phone: [phone]   Fax:  [phone]

## 2015-03-28 ENCOUNTER — Ambulatory Visit (HOSPITAL_COMMUNITY): Payer: Medicare Other | Admitting: Physical Therapy

## 2015-04-01 ENCOUNTER — Ambulatory Visit (HOSPITAL_COMMUNITY): Payer: Medicare Other | Attending: Hematology and Oncology | Admitting: Physical Therapy

## 2015-04-01 DIAGNOSIS — S91001D Unspecified open wound, right ankle, subsequent encounter: Secondary | ICD-10-CM | POA: Insufficient documentation

## 2015-04-01 DIAGNOSIS — Z86711 Personal history of pulmonary embolism: Secondary | ICD-10-CM | POA: Insufficient documentation

## 2015-04-01 DIAGNOSIS — S81001D Unspecified open wound, right knee, subsequent encounter: Secondary | ICD-10-CM | POA: Diagnosis not present

## 2015-04-01 DIAGNOSIS — T148XXA Other injury of unspecified body region, initial encounter: Secondary | ICD-10-CM

## 2015-04-01 DIAGNOSIS — S81801D Unspecified open wound, right lower leg, subsequent encounter: Secondary | ICD-10-CM

## 2015-04-01 DIAGNOSIS — S81002D Unspecified open wound, left knee, subsequent encounter: Secondary | ICD-10-CM

## 2015-04-01 DIAGNOSIS — S81802D Unspecified open wound, left lower leg, subsequent encounter: Secondary | ICD-10-CM

## 2015-04-01 DIAGNOSIS — S91002D Unspecified open wound, left ankle, subsequent encounter: Secondary | ICD-10-CM

## 2015-04-01 NOTE — Therapy (Signed)
[place], [place], [address] Phone: [phone]   Fax:  [phone]  Wound Care Therapy  Patient Details  Name: Matthew Wise MRN: [id number] Date of Birth: 23-Jun-1961 Referring Provider:  Redmond School, MD  Encounter Date: 04/01/2015      PT End of Session - 04/01/15 1149    Visit Number 92   Number of Visits 80   Date for PT Re-Evaluation 04/20/15   Authorization Type medicare (gcode updated on visit #40)   Authorization - Visit Number 35   Authorization - Number of Visits 50   PT Start Time 1022   PT Stop Time 1100   PT Time Calculation (min) 38 min   Activity Tolerance Patient tolerated treatment well   Behavior During Therapy [facility] for tasks assessed/performed      Past Medical History  Diagnosis Date  . DVT of leg (deep venous thrombosis)     rt. leg  . Inferior vena caval thrombosis     Chronic  . DVT (deep venous thrombosis) 06/15/2011  . H/O PE (pulmonary embolism) 06/15/2011  . 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 Chronic anticoagulation 11/14/2014    No past surgical history on file.  There were no vitals filed for this visit.  Visit Diagnosis:  Nonhealing nonsurgical wound  Open wound of knee, leg (except thigh), and ankle, complicated, right, subsequent encounter  Open wound of knee, leg (except thigh), and ankle, complicated, left, subsequent encounter                 Wound Therapy - 04/01/15 1145    Subjective Pt states it is not yet healed but is smaller he thinks.   Patient and Family Stated Goals wound to heal   Date of Onset 02/06/15   Pain Score 0-No pain   Wound Properties Date First Assessed: 02/06/15 Time First Assessed: 1100 Wound Type: Other (Comment) Location: Leg Location Orientation: Right;Posterior Wound Description (Comments): Rt posterior mid calf region Present  on Admission: No   Dressing Type Other (Comment)  Compression garment on LE but no dressing on wound   Dressing Changed Changed   Dressing Status None   Dressing Change Frequency Every 3 days   Site / Wound Assessment Dusky   % Wound base Red or Granulating 95%   % Wound base Yellow 5%   Peri-wound Assessment Other (Comment);Intact;Edema;Induration  decreased edema noted today   Wound Length (cm) 0.8 cm   Wound Width (cm) 1.5 cm   Wound Depth (cm) 0.6 cm   Drainage Amount Minimal   Drainage Description Sanguineous   Treatment Cleansed;Debridement (Selective)   Selective Debridement - Location wound bed and edges   Selective Debridement - Tools Used Forceps   Selective Debridement - Tissue Removed nonviable tissue    Wound Therapy - Clinical Statement wound with decreased depth but no significant reduction in size.  PT encouraged to keep dressing in/covering wound,  No signs/symptoms of infection noted.  Continued dressing with medihoney gel and gauze.     Hydrotherapy Plan Other (comment)  complete decongestive therapy techniques with manual/comress   Wound Plan Continue Wound care and compression bandaging for Rt LE.   Dressing  medihoney gel gauze packed into wound   Dressing short stretch bandages, 1/2" foam   Manual Therapy Pt received short manual lymph drainage to decrease swelling including supraclavicular, deep/superficial abdominal and routing  fluid using inguinal/axillary pathways both anterior and posteriorly.    Decrease Necrotic Tissue to STG:  Pt to see surgeon 3 weeks    Decrease Necrotic Tissue - Progress Progressing toward goal   Increase Granulation Tissue to STG: 2- 3weeks.  Decrease depth of wound by 2 cm    Increase Granulation Tissue - Progress Progressing toward goal   Decrease Length/Width/Depth by (cm) LTG 1:  8 weeks - Pt wound to be decreased by 1x1.8x5 cm   Decrease Length/Width/Depth - Progress Progressing toward goal   Patient/Family will be able to  LTG  2:  Have compression on at all times to decrese swelling   Patient/Family Instruction Goal - Progress Progressing toward goal   Goals/treatment plan/discharge plan were made with and agreed upon by patient/family Yes   Time For Goal Achievement Other (comment)   Wound Therapy - Potential for Goals Good                   PT Short Term Goals - 02/19/15 1647    PT SHORT TERM GOAL #2   Title Pt to have seen a surgeon    Time 1   Period Weeks   PT SHORT TERM GOAL #3   Title If surgeon want theapy to continue decrease depth by 2 xcm    Time 3   Period Weeks           PT Long Term Goals - 02/19/15 1648    PT LONG TERM GOAL #1   Title decrease wound size by 1.0x 1.8 x 5 cm    Time 8   Period Weeks   PT LONG TERM GOAL #2   Title Pt to be wearing compression at all times   Time 8   Period Weeks                Problem List Patient Active Problem List   Diagnosis Date Noted  . Cellulitis of right lower extremity   . UTI (lower urinary tract infection) 02/03/2015  . Cellulitis 02/03/2015  . Blepharitis 02/03/2015  . Chronic anticoagulation 11/14/2014  . 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 Leg wound, left 08/19/2011  . DVT (deep venous thrombosis) 06/15/2011  . H/O PE (pulmonary embolism) 06/15/2011  . 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

## 2015-04-03 ENCOUNTER — Ambulatory Visit (HOSPITAL_COMMUNITY): Payer: Self-pay

## 2015-04-03 ENCOUNTER — Other Ambulatory Visit (HOSPITAL_COMMUNITY): Payer: Self-pay

## 2015-04-04 ENCOUNTER — Ambulatory Visit (HOSPITAL_COMMUNITY): Payer: Medicare Other | Admitting: Physical Therapy

## 2015-04-04 DIAGNOSIS — S81001D Unspecified open wound, right knee, subsequent encounter: Secondary | ICD-10-CM | POA: Diagnosis not present

## 2015-04-04 DIAGNOSIS — S91001D Unspecified open wound, right ankle, subsequent encounter: Secondary | ICD-10-CM | POA: Diagnosis not present

## 2015-04-04 DIAGNOSIS — S81802D Unspecified open wound, left lower leg, subsequent encounter: Secondary | ICD-10-CM

## 2015-04-04 DIAGNOSIS — S81801D Unspecified open wound, right lower leg, subsequent encounter: Secondary | ICD-10-CM

## 2015-04-04 DIAGNOSIS — S91002D Unspecified open wound, left ankle, subsequent encounter: Secondary | ICD-10-CM

## 2015-04-04 DIAGNOSIS — T148XXA Other injury of unspecified body region, initial encounter: Secondary | ICD-10-CM

## 2015-04-04 DIAGNOSIS — S81002D Unspecified open wound, left knee, subsequent encounter: Secondary | ICD-10-CM

## 2015-04-04 DIAGNOSIS — Z86711 Personal history of pulmonary embolism: Secondary | ICD-10-CM | POA: Diagnosis not present

## 2015-04-04 NOTE — Therapy (Signed)
[place], [place], [address] Phone: [phone]   Fax:  [phone]  Wound Care Therapy  Patient Details  Name: Matthew Wise MRN: [id number] Date of Birth: 03-05-1961 Referring Provider:  Redmond School, MD  Encounter Date: 04/04/2015      PT End of Session - 04/04/15 1212    Visit Number 59   Number of Visits 59   Date for PT Re-Evaluation 04/20/15   Authorization Type medicare (gcode updated on visit #40)   Authorization - Visit Number 4   Authorization - Number of Visits 50   PT Start Time 1020   PT Stop Time 1105   PT Time Calculation (min) 45 min      Past Medical History  Diagnosis Date  . DVT of leg (deep venous thrombosis)     rt. leg  . Inferior vena caval thrombosis     Chronic  . DVT (deep venous thrombosis) 06/15/2011  . H/O PE (pulmonary embolism) 06/15/2011  . 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 Chronic anticoagulation 11/14/2014    No past surgical history on file.  There were no vitals filed for this visit.  Visit Diagnosis:  Nonhealing nonsurgical wound  Open wound of knee, leg (except thigh), and ankle, complicated, right, subsequent encounter  Open wound of knee, leg (except thigh), and ankle, complicated, left, subsequent encounter                 Wound Therapy - 04/04/15 1204    Subjective Pt has no complaints    Patient and Family Stated Goals wound to heal   Date of Onset 02/06/15   Pain Score 0-No pain   Wound Properties Date First Assessed: 02/06/15 Time First Assessed: 1100 Wound Type: Other (Comment) Location: Leg Location Orientation: Right;Posterior Wound Description (Comments): Rt posterior mid calf region Present on Admission: No   Dressing Type Moist to moist  Compression garment on LE but no dressing on wound   Dressing Changed Changed   Dressing Status  None   Dressing Change Frequency Every 3 days   Site / Wound Assessment Granulation tissue   % Wound base Red or Granulating 100%   % Wound base Yellow 0%   Peri-wound Assessment Other (Comment);Intact;Edema;Induration  decreased edema noted today   Wound Length (cm) 0.6 cm   Wound Width (cm) 1.5 cm   Wound Depth (cm) 0.5 cm   Tunneling (cm) superiorly .4; laterally .3; inferiorly .2   Margins Epibole (rolled edges)   Drainage Amount Minimal   Drainage Description Sanguineous   Treatment Cleansed;Debridement (Selective)   Selective Debridement - Location edges of wound    Selective Debridement - Tools Used Forceps   Selective Debridement - Tissue Removed epiboled tissue    Wound Therapy - Clinical Statement wound is slowly filling in i.e.decreased undermining and depth which is what needs to happen prior to wound size decrasing    Hydrotherapy Plan Other (comment)  complete decongestive therapy techniques with manual/comress   Wound Plan Continue Wound care and compression bandaging for Rt LE.  Remeasure wound again next treatment for reassessment also complete recertification as well as G-code .   Dressing  wound packed with moistened 2x2; moistened wiht Carraklenze    Dressing short stretch bandages, 1/2" foam   Manual Therapy Pt received short manual lymph drainage to decrease swelling including supraclavicular, deep/superficial abdominal and routing  fluid using inguinal/axillary pathways both anterior and posteriorly.    Decrease Necrotic Tissue to STG:  Pt to see surgeon 3 weeks    Decrease Necrotic Tissue - Progress Met   Increase Granulation Tissue to STG: 2- 3weeks.  Decrease depth of wound by 2 cm    Increase Granulation Tissue - Progress Met   Decrease Length/Width/Depth by (cm) LTG 1:  8 weeks - Pt wound to be decreased by 1x1.8x5 cm   Decrease Length/Width/Depth - Progress Progressing toward goal   Patient/Family will be able to  LTG 2:  Have compression on at all times to  decrese swelling   Patient/Family Instruction Goal - Progress Partly met   Goals/treatment plan/discharge plan were made with and agreed upon by patient/family Yes   Time For Goal Achievement Other (comment)   Wound Therapy - Potential for Goals Good               PT Short Term Goals - 02/19/15 1647    PT SHORT TERM GOAL #2   Title Pt to have seen a surgeon    Time 1   Period Weeks   PT SHORT TERM GOAL #3   Title If surgeon want theapy to continue decrease depth by 2 xcm    Time 3   Period Weeks           PT Long Term Goals - 02/19/15 1648    PT LONG TERM GOAL #1   Title decrease wound size by 1.0x 1.8 x 5 cm    Time 8   Period Weeks   PT LONG TERM GOAL #2   Title Pt to be wearing compression at all times   Time 8   Period Weeks        Problem List Patient Active Problem List   Diagnosis Date Noted  . Cellulitis of right lower extremity   . UTI (lower urinary tract infection) 02/03/2015  . Cellulitis 02/03/2015  . Blepharitis 02/03/2015  . Chronic anticoagulation 11/14/2014  . 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 Leg wound, left 08/19/2011  . DVT (deep venous thrombosis) 06/15/2011  . H/O PE (pulmonary embolism) 06/15/2011  . 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 [place], [place], [address] Phone: [phone]   Fax:  [phone]

## 2015-04-08 ENCOUNTER — Ambulatory Visit (HOSPITAL_COMMUNITY): Payer: Medicare Other | Admitting: Physical Therapy

## 2015-04-08 ENCOUNTER — Telehealth (HOSPITAL_COMMUNITY): Payer: Self-pay | Admitting: Physical Therapy

## 2015-04-11 ENCOUNTER — Ambulatory Visit (HOSPITAL_COMMUNITY): Payer: Medicare Other | Admitting: Physical Therapy

## 2015-04-11 DIAGNOSIS — S81001D Unspecified open wound, right knee, subsequent encounter: Secondary | ICD-10-CM | POA: Diagnosis not present

## 2015-04-11 DIAGNOSIS — S91001D Unspecified open wound, right ankle, subsequent encounter: Secondary | ICD-10-CM

## 2015-04-11 DIAGNOSIS — Z86711 Personal history of pulmonary embolism: Secondary | ICD-10-CM | POA: Diagnosis not present

## 2015-04-11 DIAGNOSIS — S91002D Unspecified open wound, left ankle, subsequent encounter: Secondary | ICD-10-CM

## 2015-04-11 DIAGNOSIS — S81801D Unspecified open wound, right lower leg, subsequent encounter: Secondary | ICD-10-CM

## 2015-04-11 DIAGNOSIS — T148XXA Other injury of unspecified body region, initial encounter: Secondary | ICD-10-CM

## 2015-04-11 DIAGNOSIS — S81802D Unspecified open wound, left lower leg, subsequent encounter: Secondary | ICD-10-CM

## 2015-04-11 DIAGNOSIS — S81002D Unspecified open wound, left knee, subsequent encounter: Secondary | ICD-10-CM

## 2015-04-11 NOTE — Therapy (Signed)
[place], [place], [address] Phone: [phone]   Fax:  [phone]  Physical Therapy Treatment  Patient Details  Name: Matthew Wise MRN: [id number] Date of Birth: Jul 04, 1961 Referring Provider:  Redmond School, MD  Encounter Date: 04/11/2015      PT End of Session - 04/11/15 1215    Visit Number 48   Number of Visits 50   Date for PT Re-Evaluation 04/20/15   Authorization Type medicare (gcode updated on visit #40)   Authorization - Visit Number 90   Authorization - Number of Visits 50   PT Start Time 1028   PT Stop Time 1100   PT Time Calculation (min) 32 min   Activity Tolerance Patient tolerated treatment well   Behavior During Therapy [facility] for tasks assessed/performed      Past Medical History  Diagnosis Date  . DVT of leg (deep venous thrombosis)     rt. leg  . Inferior vena caval thrombosis     Chronic  . DVT (deep venous thrombosis) 06/15/2011  . H/O PE (pulmonary embolism) 06/15/2011  . 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 Chronic anticoagulation 11/14/2014    No past surgical history on file.  There were no vitals filed for this visit.  Visit Diagnosis:  Nonhealing nonsurgical wound  Open wound of knee, leg (except thigh), and ankle, complicated, right, subsequent encounter  Open wound of knee, leg (except thigh), and ankle, complicated, left, subsequent encounter                     Wound Therapy - 04/11/15 1207    Subjective Pt showed 11 minutes late for appt.  Pt DNS for last appointment and has not been for woundcare X 12 days.  Pt comes today with bandaged taped over wound and no compression.   Patient and Family Stated Goals wound to heal   Date of Onset 02/06/15   Pain Score 0-No pain   Wound Properties Date First Assessed: 02/06/15 Time First Assessed: 1100 Wound Type:  Other (Comment) Location: Leg Location Orientation: Right;Posterior Wound Description (Comments): Rt posterior mid calf region Present on Admission: No   Dressing Type Moist to moist   Dressing Changed Changed   Dressing Status None   Dressing Change Frequency Every 3 days   Site / Wound Assessment Granulation tissue   % Wound base Red or Granulating 100%   % Wound base Yellow 0%   Peri-wound Assessment Other (Comment);Intact;Edema;Induration  decreased edema noted today   Wound Length (cm) 0.5 cm   Wound Width (cm) 1 cm   Wound Depth (cm) 0.3 cm   Tunneling (cm) no tunneling present   Margins Epibole (rolled edges)   Drainage Amount Minimal   Drainage Description Serosanguineous   Treatment Cleansed;Debridement (Selective)   Selective Debridement - Location edges of wound    Selective Debridement - Tools Used Forceps   Selective Debridement - Tissue Removed epiboled tissue    Wound Therapy - Clinical Statement Wound remeasured with overall decrease in size.  Raw area noted inferior to wound that is raw and suspect is from tape being over area to secure dressing.  Pt instructed not to use tape on his skin and keep compression   Hydrotherapy Plan Other (comment)  unable to complete full MLD today due to patient being late.   Wound Plan Continue Wound care  and compression bandaging for Rt LE.   Dressing  wound packed with moistened 2x2; moistened wiht Carraklenze    Dressing short stretch bandages, 1/2" foam   Manual Therapy Pt received short manual lymph drainage to decrease swelling including supraclavicular, deep/superficial abdominal and routing fluid using inguinal/axillary pathways both anterior and posteriorly.    Decrease Necrotic Tissue to STG:  Pt to see surgeon 3 weeks    Decrease Necrotic Tissue - Progress Discontinued (comment)   Increase Granulation Tissue to STG: 2- 3weeks.  Decrease depth of wound by 2 cm    Increase Granulation Tissue - Progress Met   Decrease  Length/Width/Depth by (cm) LTG 1:  8 weeks - Pt wound to be decreased by 1x1.8x5 cm   Decrease Length/Width/Depth - Progress Progressing toward goal   Patient/Family will be able to  LTG 2:  Have compression on at all times to decrese swelling   Patient/Family Instruction Goal - Progress Progressing toward goal   Goals/treatment plan/discharge plan were made with and agreed upon by patient/family Yes   Time For Goal Achievement Other (comment)   Wound Therapy - Potential for Goals Good                    PT Short Term Goals - 02/19/15 1647    PT SHORT TERM GOAL #2   Title Pt to have seen a surgeon    Time 1   Period Weeks   PT SHORT TERM GOAL #3   Title If surgeon want theapy to continue decrease depth by 2 xcm    Time 3   Period Weeks           PT Long Term Goals - 02/19/15 1648    PT LONG TERM GOAL #1   Title decrease wound size by 1.0x 1.8 x 5 cm    Time 8   Period Weeks   PT LONG TERM GOAL #2   Title Pt to be wearing compression at all times   Time 8   Period Weeks               Problem List Patient Active Problem List   Diagnosis Date Noted  . Cellulitis of right lower extremity   . UTI (lower urinary tract infection) 02/03/2015  . Cellulitis 02/03/2015  . Blepharitis 02/03/2015  . Chronic anticoagulation 11/14/2014  . 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 Leg wound, left 08/19/2011  . DVT (deep venous thrombosis) 06/15/2011  . H/O PE (pulmonary embolism) 06/15/2011  . 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

## 2015-04-15 ENCOUNTER — Ambulatory Visit (HOSPITAL_COMMUNITY): Payer: Medicare Other | Admitting: Physical Therapy

## 2015-04-18 ENCOUNTER — Ambulatory Visit (HOSPITAL_COMMUNITY): Payer: Medicare Other | Admitting: Physical Therapy

## 2015-04-18 DIAGNOSIS — S81801D Unspecified open wound, right lower leg, subsequent encounter: Secondary | ICD-10-CM

## 2015-04-18 DIAGNOSIS — S81001D Unspecified open wound, right knee, subsequent encounter: Secondary | ICD-10-CM | POA: Diagnosis not present

## 2015-04-18 DIAGNOSIS — S91002D Unspecified open wound, left ankle, subsequent encounter: Secondary | ICD-10-CM

## 2015-04-18 DIAGNOSIS — S91001D Unspecified open wound, right ankle, subsequent encounter: Secondary | ICD-10-CM

## 2015-04-18 DIAGNOSIS — T148XXA Other injury of unspecified body region, initial encounter: Secondary | ICD-10-CM

## 2015-04-18 DIAGNOSIS — S81802D Unspecified open wound, left lower leg, subsequent encounter: Secondary | ICD-10-CM

## 2015-04-18 DIAGNOSIS — Z86711 Personal history of pulmonary embolism: Secondary | ICD-10-CM | POA: Diagnosis not present

## 2015-04-18 DIAGNOSIS — S81002D Unspecified open wound, left knee, subsequent encounter: Secondary | ICD-10-CM

## 2015-04-18 NOTE — Therapy (Signed)
[place], [place], [address] Phone: [phone]   Fax:  [phone]  Wound Care Therapy/Reassessment  Patient Details  Name: Matthew Wise MRN: [id number] Date of Birth: [birth year] Referring Provider:  Redmond School, MD  Encounter Date: 04/18/2015      PT End of Session - 04/18/15 1053    Visit Number 23   Number of Visits 35   Date for PT Re-Evaluation 04/20/15   Authorization Type medicare (gcode updated on visit #40)   Authorization - Visit Number 57   Authorization - Number of Visits 35   PT Start Time 0935   PT Stop Time 1015   PT Time Calculation (min) 40 min   Activity Tolerance Patient tolerated treatment well      Past Medical History  Diagnosis Date  . DVT of leg (deep venous thrombosis)     rt. leg  . Inferior vena caval thrombosis     Chronic  . DVT (deep venous thrombosis) 06/15/2011  . H/O PE (pulmonary embolism) 06/15/2011  . 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 Chronic anticoagulation 11/14/2014    No past surgical history on file.  There were no vitals filed for this visit.  Visit Diagnosis:  Nonhealing nonsurgical wound  Open wound of knee, leg (except thigh), and ankle, complicated, right, subsequent encounter  Open wound of knee, leg (except thigh), and ankle, complicated, left, subsequent encounter                 Wound Therapy - 04/18/15 1041    Subjective PT has no complaints    Patient and Family Stated Goals wound to heal   Date of Onset 02/06/15   Wound Properties Date First Assessed: 02/06/15 Time First Assessed: 1100 Wound Type: Other (Comment) Location: Leg Location Orientation: Right;Posterior Wound Description (Comments): Rt posterior mid calf region Present on Admission: No   Dressing Type Moist to moist   Dressing Changed Changed   Dressing Status None   Dressing Change Frequency Every 3 days   Site / Wound Assessment Dusky;Granulation tissue   % Wound base Red or Granulating 100%  03/20/2015 was 90%   % Wound base Yellow 0%  03/20/2015 was 10%    Peri-wound Assessment Other (Comment);Intact;Edema;Induration  decreased edema noted today   Wound Length (cm) 0.5 cm  4/14/ was 1.0 cm    Wound Width (cm) 1.1 cm  was 1.6 cm    Wound Depth (cm) 0.3 cm  4/4/ was 1.5 cm    Undermining (cm) .5 from 12:00 to 6;00  4/4/ assessment stated wound was tunneling 1.5 cm this is not accurate this should have been undermining and not tunneling    Margins Epibole (rolled edges)   Drainage Amount Minimal   Drainage Description Serosanguineous   Treatment Cleansed;Debridement (Selective);Other (Comment)  minimal debridement( less than 5 minues) majority manual    Selective Debridement - Location edges of wound    Selective Debridement - Tools Used Forceps   Selective Debridement - Tissue Removed epiboled tissue    Wound Therapy - Clinical Statement Wound remeasured with overall decrease in size.  PT has noted decreased induration.     Hydrotherapy Plan Debridement;Dressing change;Patient/family education;Other (comment)  CLD including manual and compression dressing with foam and short stretch bandages    Wound Plan Continue Wound care and compression bandaging for Rt LE.   Dressing  wound packed with moistened 2x2; moistened wiht Carraklenze    Dressing short stretch bandages, 1/2" foam   Manual Therapy Pt received short manual lymph drainage to decrease swelling including supraclavicular, deep/superficial abdominal and routing fluid using inguinal/axillary pathways both anterior and posteriorly.    Decrease Necrotic Tissue to STG:  Pt to see surgeon 3 weeks    Decrease Necrotic Tissue - Progress Discontinued (comment)   Increase Granulation Tissue to STG: 2- 3weeks.  Decrease depth of wound by 2 cm    Increase Granulation Tissue - Progress Met    Decrease Length/Width/Depth by (cm) LTG 1:  8 weeks - Pt wound to be decreased by 1x1.8x5 cm   Decrease Length/Width/Depth - Progress Met   Patient/Family will be able to  LTG 2:  Have compression on at all times to decrese swelling   Patient/Family Instruction Goal - Progress Met   Additional Wound Therapy Goal New as of 04/18/2015:  Wound to be healed ( therapist requires wound to be healed due to complicated hx of DVT and multiple deep wounds that occure quickly without trauma.  - 3 additional weeks    Additional Wound Therapy Goal - Progress Goal set today   Goals/treatment plan/discharge plan were made with and agreed upon by patient/family Yes   Time For Goal Achievement Other (comment)   Wound Therapy - Potential for Goals Good             Problem List Patient Active Problem List   Diagnosis Date Noted  . Cellulitis of right lower extremity   . UTI (lower urinary tract infection) 02/03/2015  . Cellulitis 02/03/2015  . Blepharitis 02/03/2015  . Chronic anticoagulation 11/14/2014  . 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 Leg wound, left 08/19/2011  . DVT (deep venous thrombosis) 06/15/2011  . H/O PE (pulmonary embolism) 06/15/2011  . 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

## 2015-04-22 ENCOUNTER — Ambulatory Visit (HOSPITAL_COMMUNITY): Payer: Medicare Other | Admitting: Physical Therapy

## 2015-04-22 DIAGNOSIS — S81001D Unspecified open wound, right knee, subsequent encounter: Secondary | ICD-10-CM | POA: Diagnosis not present

## 2015-04-22 DIAGNOSIS — S91002D Unspecified open wound, left ankle, subsequent encounter: Secondary | ICD-10-CM

## 2015-04-22 DIAGNOSIS — Z86711 Personal history of pulmonary embolism: Secondary | ICD-10-CM | POA: Diagnosis not present

## 2015-04-22 DIAGNOSIS — S81002D Unspecified open wound, left knee, subsequent encounter: Secondary | ICD-10-CM

## 2015-04-22 DIAGNOSIS — T148XXA Other injury of unspecified body region, initial encounter: Secondary | ICD-10-CM

## 2015-04-22 DIAGNOSIS — S81802D Unspecified open wound, left lower leg, subsequent encounter: Secondary | ICD-10-CM

## 2015-04-22 DIAGNOSIS — S81801D Unspecified open wound, right lower leg, subsequent encounter: Secondary | ICD-10-CM

## 2015-04-22 DIAGNOSIS — S91001D Unspecified open wound, right ankle, subsequent encounter: Secondary | ICD-10-CM | POA: Diagnosis not present

## 2015-04-22 NOTE — Therapy (Signed)
[place], [place], [address] Phone: [phone]   Fax:  [phone]  Wound Care Therapy  Patient Details  Name: Matthew Wise MRN: [id number] Date of Birth: [birth year] Referring Provider:  Redmond School, MD  Encounter Date: 04/22/2015      PT End of Session - 04/22/15 1258    Visit Number 50   Number of Visits 25   Date for PT Re-Evaluation 04/20/15   Authorization Type medicare   Authorization - Visit Number 75   Authorization - Number of Visits 75   PT Start Time 0940   PT Stop Time 1020   PT Time Calculation (min) 40 min   Activity Tolerance Patient tolerated treatment well   Behavior During Therapy [facility] for tasks assessed/performed      Past Medical History  Diagnosis Date  . DVT of leg (deep venous thrombosis)     rt. leg  . Inferior vena caval thrombosis     Chronic  . DVT (deep venous thrombosis) 06/15/2011  . H/O PE (pulmonary embolism) 06/15/2011  . 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 Chronic anticoagulation 11/14/2014    No past surgical history on file.  There were no vitals filed for this visit.  Visit Diagnosis:  Nonhealing nonsurgical wound  Open wound of knee, leg (except thigh), and ankle, complicated, right, subsequent encounter  Open wound of knee, leg (except thigh), and ankle, complicated, left, subsequent encounter                 Wound Therapy - 04/22/15 1238    Subjective Pt 10 minutes late again.  Comes today with juxtafit garment but no dressing over wound.  Pt reports no pain today.   Patient and Family Stated Goals wound to heal   Date of Onset 02/06/15   Pain Score 0-No pain   Wound Properties Date First Assessed: 02/06/15 Time First Assessed: 1100 Wound Type: Other (Comment) Location: Leg Location Orientation: Right;Posterior Wound Description (Comments): Rt  posterior mid calf region Present on Admission: No   Dressing Type Moist to moist   Dressing Status None   Dressing Change Frequency Every 3 days   Site / Wound Assessment Dusky;Granulation tissue   % Wound base Red or Granulating 100%  03/20/2015 was 90%   % Wound base Yellow 0%  03/20/2015 was 10%    Peri-wound Assessment Other (Comment);Intact;Edema;Induration  decreased edema noted today   Margins Epibole (rolled edges)   Drainage Amount Minimal   Drainage Description Serosanguineous   Treatment Cleansed;Debridement (Selective)   Selective Debridement - Location edges of wound and surrounding tissue    Selective Debridement - Tools Used Forceps   Selective Debridement - Tissue Removed nonviable tissue, slough   Wound Therapy - Clinical Statement Overall decreased induration, however remains swollen especially around ankle.  Noted moist necrotic tissue perimeter of wound.  Debrided away nonviable tissue.  Instructed patient to keep wound covered.  Minimal MLD completed today.  Completed bandaging with short stretch.    Hydrotherapy Plan Debridement;Dressing change;Patient/family education;Other (comment)  CLD including manual and compression dressing with foam and    Wound Plan Continue Wound care and compression bandaging for Rt LE.   Dressing  wound packed with moistened 2x2; moistened wiht Carraklenze    Dressing short stretch bandages, 1/2" foam   Manual Therapy Pt received short manual lymph drainage to decrease swelling including  supraclavicular, deep/superficial abdominal and routing fluid using inguinal/axillary pathways both anterior and posteriorly.    Decrease Necrotic Tissue to STG:  Pt to see surgeon 3 weeks    Decrease Necrotic Tissue - Progress Discontinued (comment)   Increase Granulation Tissue to STG: 2- 3weeks.  Decrease depth of wound by 2 cm    Increase Granulation Tissue - Progress Met   Decrease Length/Width/Depth by (cm) LTG 1:  8 weeks - Pt wound to be decreased  by 1x1.8x5 cm   Decrease Length/Width/Depth - Progress Met   Patient/Family will be able to  LTG 2:  Have compression on at all times to decrese swelling   Patient/Family Instruction Goal - Progress Met   Additional Wound Therapy Goal New as of 04/18/2015:  Wound to be healed ( therapist requires wound to be healed due to complicated hx of DVT and multiple deep wounds that occure quickly without trauma.  - 3 additional weeks    Goals/treatment plan/discharge plan were made with and agreed upon by patient/family Yes   Time For Goal Achievement Other (comment)   Wound Therapy - Potential for Goals Good                   PT Short Term Goals - 02/19/15 1647    PT SHORT TERM GOAL #2   Title Pt to have seen a surgeon    Time 1   Period Weeks   PT SHORT TERM GOAL #3   Title If surgeon want theapy to continue decrease depth by 2 xcm    Time 3   Period Weeks           PT Long Term Goals - 02/19/15 1648    PT LONG TERM GOAL #1   Title decrease wound size by 1.0x 1.8 x 5 cm    Time 8   Period Weeks   PT LONG TERM GOAL #2   Title Pt to be wearing compression at all times   Time 8   Period Weeks                Problem List Patient Active Problem List   Diagnosis Date Noted  . Cellulitis of right lower extremity   . UTI (lower urinary tract infection) 02/03/2015  . Cellulitis 02/03/2015  . Blepharitis 02/03/2015  . Chronic anticoagulation 11/14/2014  . 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 Leg wound, left 08/19/2011  . DVT (deep venous thrombosis) 06/15/2011  . H/O PE (pulmonary embolism) 06/15/2011  . 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

## 2015-04-24 ENCOUNTER — Ambulatory Visit (HOSPITAL_COMMUNITY): Payer: Medicare Other | Admitting: Physical Therapy

## 2015-04-24 DIAGNOSIS — S81001D Unspecified open wound, right knee, subsequent encounter: Secondary | ICD-10-CM | POA: Diagnosis not present

## 2015-04-24 DIAGNOSIS — S91001D Unspecified open wound, right ankle, subsequent encounter: Secondary | ICD-10-CM

## 2015-04-24 DIAGNOSIS — Z86711 Personal history of pulmonary embolism: Secondary | ICD-10-CM | POA: Diagnosis not present

## 2015-04-24 DIAGNOSIS — S81002D Unspecified open wound, left knee, subsequent encounter: Secondary | ICD-10-CM

## 2015-04-24 DIAGNOSIS — S91002D Unspecified open wound, left ankle, subsequent encounter: Secondary | ICD-10-CM

## 2015-04-24 DIAGNOSIS — S81801D Unspecified open wound, right lower leg, subsequent encounter: Secondary | ICD-10-CM

## 2015-04-24 DIAGNOSIS — S81802D Unspecified open wound, left lower leg, subsequent encounter: Secondary | ICD-10-CM

## 2015-04-24 DIAGNOSIS — T148XXA Other injury of unspecified body region, initial encounter: Secondary | ICD-10-CM

## 2015-04-24 NOTE — Therapy (Signed)
[place], [place], [address] Phone: [phone]   Fax:  [phone]  Wound Care Therapy  Patient Details  Name: Matthew Wise MRN: [id number] Date of Birth: 03/22/1961 Referring Provider:  Redmond School, MD  Encounter Date: 04/24/2015      PT End of Session - 04/24/15 1024    Visit Number 51   Number of Visits 32   Date for PT Re-Evaluation 04/20/15   Authorization Type medicare   Authorization - Visit Number 72   Authorization - Number of Visits 56   PT Start Time 0940   PT Stop Time 1010   PT Time Calculation (min) 30 min   Activity Tolerance Patient tolerated treatment well   Behavior During Therapy [facility] for tasks assessed/performed      Past Medical History  Diagnosis Date  . DVT of leg (deep venous thrombosis)     rt. leg  . Inferior vena caval thrombosis     Chronic  . DVT (deep venous thrombosis) 06/15/2011  . H/O PE (pulmonary embolism) 06/15/2011  . 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 Chronic anticoagulation 11/14/2014    No past surgical history on file.  There were no vitals filed for this visit.  Visit Diagnosis:  Nonhealing nonsurgical wound  Open wound of knee, leg (except thigh), and ankle, complicated, right, subsequent encounter  Open wound of knee, leg (except thigh), and ankle, complicated, left, subsequent encounter                 Wound Therapy - 04/24/15 1022    Subjective PT reports his LE is feeling fine.  NO pain.   Patient and Family Stated Goals wound to heal   Date of Onset 02/06/15   Wound Properties Date First Assessed: 02/06/15 Time First Assessed: 1100 Wound Type: Other (Comment) Location: Leg Location Orientation: Right;Posterior Wound Description (Comments): Rt posterior mid calf region Present on Admission: No   Dressing Type Moist to moist   Dressing  Changed Changed   Dressing Status None   Dressing Change Frequency Every 3 days   Site / Wound Assessment Dusky;Granulation tissue   % Wound base Red or Granulating 100%  03/20/2015 was 90%   % Wound base Yellow 0%  03/20/2015 was 10%    Peri-wound Assessment Other (Comment);Intact;Edema;Induration  decreased edema noted today   Wound Length (cm) 0.5 cm   Wound Width (cm) 1 cm   Wound Depth (cm) 0.2 cm   Margins Attached edges (approximated)   Drainage Amount Minimal   Drainage Description Serosanguineous   Treatment Cleansed;Debridement (Selective)   Selective Debridement - Location edges of wound and surrounding tissue    Selective Debridement - Tools Used Forceps   Selective Debridement - Tissue Removed nonviable tissue, slough   Wound Therapy - Clinical Statement Perimeter of wound much improved without signs of infection.  Continued with debridement and use of medihoney gel.  Wound continues to improve and approximate.    Hydrotherapy Plan Debridement;Dressing change;Patient/family education;Other (comment)  CLD including manual and compression dressing with foam and    Wound Plan Continue Wound care and compression bandaging for Rt LE.   Dressing  wound packed with moistened 2x2; moistened wiht Carraklenze    Dressing short stretch bandages, 1/2" foam   Manual Therapy Pt received short manual lymph drainage to decrease swelling including supraclavicular, deep/superficial abdominal and routing fluid using  inguinal/axillary pathways both anterior and posteriorly.    Decrease Necrotic Tissue to STG:  Pt to see surgeon 3 weeks    Increase Granulation Tissue to STG: 2- 3weeks.  Decrease depth of wound by 2 cm    Decrease Length/Width/Depth by (cm) LTG 1:  8 weeks - Pt wound to be decreased by 1x1.8x5 cm   Patient/Family will be able to  LTG 2:  Have compression on at all times to decrese swelling   Additional Wound Therapy Goal New as of 04/18/2015:  Wound to be healed ( therapist  requires wound to be healed due to complicated hx of DVT and multiple deep wounds that occure quickly without trauma.  - 3 additional weeks    Goals/treatment plan/discharge plan were made with and agreed upon by patient/family Yes   Time For Goal Achievement Other (comment)   Wound Therapy - Potential for Goals Good                   PT Short Term Goals - 02/19/15 1647    PT SHORT TERM GOAL #2   Title Pt to have seen a surgeon    Time 1   Period Weeks   PT SHORT TERM GOAL #3   Title If surgeon want theapy to continue decrease depth by 2 xcm    Time 3   Period Weeks           PT Long Term Goals - 02/19/15 1648    PT LONG TERM GOAL #1   Title decrease wound size by 1.0x 1.8 x 5 cm    Time 8   Period Weeks   PT LONG TERM GOAL #2   Title Pt to be wearing compression at all times   Time 8   Period Weeks                Problem List Patient Active Problem List   Diagnosis Date Noted  . Cellulitis of right lower extremity   . UTI (lower urinary tract infection) 02/03/2015  . Cellulitis 02/03/2015  . Blepharitis 02/03/2015  . Chronic anticoagulation 11/14/2014  . 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 Leg wound, left 08/19/2011  . DVT (deep venous thrombosis) 06/15/2011  . H/O PE (pulmonary embolism) 06/15/2011  . 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 [place], [place], [address] Phone: [phone]   Fax:  [phone]

## 2015-05-01 ENCOUNTER — Ambulatory Visit (HOSPITAL_COMMUNITY): Payer: Medicare Other | Admitting: Physical Therapy

## 2015-05-02 ENCOUNTER — Ambulatory Visit (HOSPITAL_COMMUNITY): Payer: Medicare Other | Attending: Hematology and Oncology | Admitting: Physical Therapy

## 2015-05-02 DIAGNOSIS — S91001D Unspecified open wound, right ankle, subsequent encounter: Secondary | ICD-10-CM | POA: Insufficient documentation

## 2015-05-02 DIAGNOSIS — S81801D Unspecified open wound, right lower leg, subsequent encounter: Secondary | ICD-10-CM

## 2015-05-02 DIAGNOSIS — Z86711 Personal history of pulmonary embolism: Secondary | ICD-10-CM | POA: Insufficient documentation

## 2015-05-02 DIAGNOSIS — S81001D Unspecified open wound, right knee, subsequent encounter: Secondary | ICD-10-CM | POA: Insufficient documentation

## 2015-05-02 DIAGNOSIS — T148XXA Other injury of unspecified body region, initial encounter: Secondary | ICD-10-CM

## 2015-05-02 DIAGNOSIS — S81002D Unspecified open wound, left knee, subsequent encounter: Secondary | ICD-10-CM

## 2015-05-02 DIAGNOSIS — S91002D Unspecified open wound, left ankle, subsequent encounter: Secondary | ICD-10-CM

## 2015-05-02 DIAGNOSIS — S81802D Unspecified open wound, left lower leg, subsequent encounter: Secondary | ICD-10-CM

## 2015-05-02 NOTE — Therapy (Signed)
[place], [place], [address] Phone: [phone]   Fax:  [phone]  Wound Care Therapy  Patient Details  Name: Matthew Wise MRN: [id number] Date of Birth: 07-12-1961 Referring Provider:  Redmond School, MD  Encounter Date: 05/02/2015      PT End of Session - 05/02/15 1014    Visit Number 50   Number of Visits 36   Date for PT Re-Evaluation 04/20/15   Authorization Type medicare   Authorization - Visit Number 53   Authorization - Number of Visits 29   PT Start Time 0845   PT Stop Time 0925   PT Time Calculation (min) 40 min   Activity Tolerance Patient tolerated treatment well   Behavior During Therapy [facility] for tasks assessed/performed      Past Medical History  Diagnosis Date  . DVT of leg (deep venous thrombosis)     rt. leg  . Inferior vena caval thrombosis     Chronic  . DVT (deep venous thrombosis) 06/15/2011  . H/O PE (pulmonary embolism) 06/15/2011  . 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 Chronic anticoagulation 11/14/2014    No past surgical history on file.  There were no vitals filed for this visit.  Visit Diagnosis:  Nonhealing nonsurgical wound  Open wound of knee, leg (except thigh), and ankle, complicated, right, subsequent encounter  Open wound of knee, leg (except thigh), and ankle, complicated, left, subsequent encounter                 Wound Therapy - 05/02/15 0957    Subjective PT reports his LE is feeling fine.  NO pain.   Patient and Family Stated Goals wound to heal   Date of Onset 02/06/15   Wound Properties Date First Assessed: 02/06/15 Time First Assessed: 1100 Wound Type: Other (Comment) Location: Leg Location Orientation: Right;Posterior Wound Description (Comments): Rt posterior mid calf region Present on Admission: No   Dressing Type Moist to moist   Dressing  Status None   Dressing Change Frequency Every 3 days   Site / Wound Assessment Dusky;Granulation tissue   % Wound base Red or Granulating 100%  03/20/2015 was 90%   % Wound base Yellow 0%  03/20/2015 was 10%    Peri-wound Assessment Other (Comment);Intact;Edema;Induration  decreased edema noted today   Wound Length (cm) 0.5 cm   Wound Width (cm) 0.5 cm   Wound Depth (cm) 0.2 cm   Margins Attached edges (approximated)   Drainage Amount Minimal   Drainage Description Serosanguineous   Treatment Cleansed;Debridement (Selective)   Wound Properties Date First Assessed: 10/16/14 Time First Assessed: 1045 Wound Type: Other (Comment) Location: Leg Location Orientation: Right;Anterior Wound Description (Comments): Mid anterior Right Lower leg Present on Admission: No Final Assessment Date: 02/12/15 Final Assessment Time: 1015   Dressing Type Compression wrap  medihoney gel   Dressing Changed Changed   Dressing Change Frequency Other (Comment)   Site / Wound Assessment Red;Pink   % Wound base Red or Granulating 75%   % Wound base Yellow 25%   Peri-wound Assessment Intact   Wound Length (cm) 0.3 cm   Wound Width (cm) 0.5 cm   Wound Depth (cm) 0.2 cm   Drainage Amount Scant   Drainage Description Serosanguineous   Treatment Cleansed;Debridement (Selective)   Selective Debridement - Location anterior and posterior Rt LE; edges of wound and surrounding tissue  Selective Debridement - Tools Used Forceps   Selective Debridement - Tissue Removed nonviable tissue, slough   Wound Therapy - Clinical Statement Overall reduction in size of posterior wound.  Noted Scab still present over old anterior wound.  As scab removed, wound revealed beneath that has not quite healed.  Also with slight odor.  Resumed care for this wound as well.  Dressed with medihoney gel and continued short stretch compression bandaging to promote healing.     Hydrotherapy Plan Debridement;Dressing change;Patient/family  education;Other (comment)  CLD including manual and compression dressing with foam and    Wound Plan Continue Wound care and compression bandaging for Rt LE.   Dressing  medihoney, 2x2, short stretch    Dressing short stretch bandages, 1/2" foam   Manual Therapy --   Decrease Necrotic Tissue to STG:  Pt to see surgeon 3 weeks    Decrease Necrotic Tissue - Progress Discontinued (comment)   Increase Granulation Tissue to STG: 2- 3weeks.  Decrease depth of wound by 2 cm    Increase Granulation Tissue - Progress Met   Decrease Length/Width/Depth by (cm) LTG 1:  8 weeks - Pt wound to be decreased by 1x1.8x5 cm   Decrease Length/Width/Depth - Progress Met   Patient/Family will be able to  LTG 2:  Have compression on at all times to decrese swelling   Patient/Family Instruction Goal - Progress Met   Additional Wound Therapy Goal New as of 04/18/2015:  Wound to be healed ( therapist requires wound to be healed due to complicated hx of DVT and multiple deep wounds that occure quickly without trauma.  - 3 additional weeks    Additional Wound Therapy Goal - Progress Progressing toward goal   Goals/treatment plan/discharge plan were made with and agreed upon by patient/family Yes   Time For Goal Achievement Other (comment)   Wound Therapy - Potential for Goals Good                   PT Short Term Goals - 02/19/15 1647    PT SHORT TERM GOAL #2   Title Pt to have seen a surgeon    Time 1   Period Weeks   PT SHORT TERM GOAL #3   Title If surgeon want theapy to continue decrease depth by 2 xcm    Time 3   Period Weeks           PT Long Term Goals - 02/19/15 1648    PT LONG TERM GOAL #1   Title decrease wound size by 1.0x 1.8 x 5 cm    Time 8   Period Weeks   PT LONG TERM GOAL #2   Title Pt to be wearing compression at all times   Time 8   Period Weeks                Problem List Patient Active Problem List   Diagnosis Date Noted  . Cellulitis of right lower  extremity   . UTI (lower urinary tract infection) 02/03/2015  . Cellulitis 02/03/2015  . Blepharitis 02/03/2015  . Chronic anticoagulation 11/14/2014  . 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 Leg wound, left 08/19/2011  . DVT (deep venous thrombosis) 06/15/2011  . H/O PE (pulmonary embolism) 06/15/2011  . 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 [place], [place], [address] Phone: [phone]   Fax:  [phone]

## 2015-05-03 ENCOUNTER — Ambulatory Visit (HOSPITAL_COMMUNITY): Payer: Medicare Other | Admitting: Physical Therapy

## 2015-05-06 ENCOUNTER — Ambulatory Visit (HOSPITAL_COMMUNITY): Payer: Medicare Other | Admitting: Physical Therapy

## 2015-05-06 DIAGNOSIS — S81802D Unspecified open wound, left lower leg, subsequent encounter: Secondary | ICD-10-CM

## 2015-05-06 DIAGNOSIS — S91001D Unspecified open wound, right ankle, subsequent encounter: Secondary | ICD-10-CM

## 2015-05-06 DIAGNOSIS — T148XXA Other injury of unspecified body region, initial encounter: Secondary | ICD-10-CM

## 2015-05-06 DIAGNOSIS — S81001D Unspecified open wound, right knee, subsequent encounter: Secondary | ICD-10-CM

## 2015-05-06 DIAGNOSIS — S81801D Unspecified open wound, right lower leg, subsequent encounter: Secondary | ICD-10-CM

## 2015-05-06 DIAGNOSIS — S91002D Unspecified open wound, left ankle, subsequent encounter: Secondary | ICD-10-CM

## 2015-05-06 DIAGNOSIS — Z86711 Personal history of pulmonary embolism: Secondary | ICD-10-CM | POA: Diagnosis not present

## 2015-05-06 DIAGNOSIS — S81002D Unspecified open wound, left knee, subsequent encounter: Secondary | ICD-10-CM

## 2015-05-06 NOTE — Therapy (Signed)
[place], [place], [address] Phone: [phone]   Fax:  [phone]  Wound Care Therapy  Patient Details  Name: Matthew Wise MRN: [id number] Date of Birth: Apr 16, 1961 Referring Provider:  Redmond School, MD  Encounter Date: 05/06/2015      PT End of Session - 05/06/15 1749    Visit Number 53   Number of Visits 31   Date for PT Re-Evaluation 04/20/15   Authorization Type medicare   Authorization - Visit Number 3   Authorization - Number of Visits 4   PT Start Time 8366   PT Stop Time 1230   PT Time Calculation (min) 32 min   Activity Tolerance Patient tolerated treatment well   Behavior During Therapy [facility] for tasks assessed/performed      Past Medical History  Diagnosis Date  . DVT of leg (deep venous thrombosis)     rt. leg  . Inferior vena caval thrombosis     Chronic  . DVT (deep venous thrombosis) 06/15/2011  . H/O PE (pulmonary embolism) 06/15/2011  . 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 Chronic anticoagulation 11/14/2014    No past surgical history on file.  There were no vitals filed for this visit.  Visit Diagnosis:  Nonhealing nonsurgical wound  Open wound of knee, leg (except thigh), and ankle, complicated, right, subsequent encounter  Open wound of knee, leg (except thigh), and ankle, complicated, left, subsequent encounter                 Wound Therapy - 05/06/15 1744    Subjective Pt showed nearly 15 minutes late and did not have all his bandages.  PT reports he did not remove until yesterday.   Patient and Family Stated Goals wound to heal   Date of Onset 02/06/15   Wound Properties Date First Assessed: 02/06/15 Time First Assessed: 1100 Wound Type: Other (Comment) Location: Leg Location Orientation: Right;Posterior Wound Description (Comments): Rt posterior mid calf region  Present on Admission: No   Dressing Type Moist to moist   Dressing Changed Changed   Dressing Status None   Dressing Change Frequency Every 3 days   Site / Wound Assessment Dusky;Granulation tissue   % Wound base Red or Granulating 100%  03/20/2015 was 90%   % Wound base Yellow 0%  03/20/2015 was 10%    Peri-wound Assessment Other (Comment);Intact;Edema;Induration  dry   Margins Attached edges (approximated)   Drainage Amount Minimal   Drainage Description Serosanguineous   Treatment Cleansed;Debridement (Selective)   Wound Properties Date First Assessed: 10/16/14 Time First Assessed: 1045 Wound Type: Other (Comment) Location: Leg Location Orientation: Right;Anterior Wound Description (Comments): Mid anterior Right Lower leg Present on Admission: No Final Assessment Date: 02/12/15 Final Assessment Time: 1015   Dressing Type Compression wrap  medihoney gel   Dressing Changed Changed   Dressing Change Frequency Other (Comment)   Site / Wound Assessment Red;Pink   % Wound base Red or Granulating 95%   % Wound base Yellow 5%   Peri-wound Assessment Intact   Drainage Amount Scant   Drainage Description Serosanguineous   Treatment Cleansed;Debridement (Selective)   Selective Debridement - Location anterior and posterior Rt LE; edges of wound and surrounding tissue    Selective Debridement - Tools Used Forceps   Selective Debridement - Tissue Removed nonviable tissue, slough   Wound Therapy - Clinical Statement Pt with  area of drainage noted below original wound posteriorly.  REmoved dry, dead skin, cleansed and dressed with medihoney gauze.  Area not with significan size and anticipate closure by next visit.  Completed dressing today with profore system.   Hydrotherapy Plan Debridement;Dressing change;Patient/family education;Other (comment)  CLD including manual and compression dressing with foam and    Wound Plan Continue Wound care and compression bandaging for Rt LE.   Dressing   medihoney, 2x2, short stretch    Dressing short stretch bandages, 1/2" foam   Decrease Necrotic Tissue to STG:  Pt to see surgeon 3 weeks    Increase Granulation Tissue to STG: 2- 3weeks.  Decrease depth of wound by 2 cm    Decrease Length/Width/Depth by (cm) LTG 1:  8 weeks - Pt wound to be decreased by 1x1.8x5 cm   Patient/Family will be able to  LTG 2:  Have compression on at all times to decrese swelling   Additional Wound Therapy Goal New as of 04/18/2015:  Wound to be healed ( therapist requires wound to be healed due to complicated hx of DVT and multiple deep wounds that occure quickly without trauma.  - 3 additional weeks    Goals/treatment plan/discharge plan were made with and agreed upon by patient/family Yes   Time For Goal Achievement Other (comment)   Wound Therapy - Potential for Goals Good                   PT Short Term Goals - 02/19/15 1647    PT SHORT TERM GOAL #2   Title Pt to have seen a surgeon    Time 1   Period Weeks   PT SHORT TERM GOAL #3   Title If surgeon want theapy to continue decrease depth by 2 xcm    Time 3   Period Weeks           PT Long Term Goals - 02/19/15 1648    PT LONG TERM GOAL #1   Title decrease wound size by 1.0x 1.8 x 5 cm    Time 8   Period Weeks   PT LONG TERM GOAL #2   Title Pt to be wearing compression at all times   Time 8   Period Weeks                Problem List Patient Active Problem List   Diagnosis Date Noted  . Cellulitis of right lower extremity   . UTI (lower urinary tract infection) 02/03/2015  . Cellulitis 02/03/2015  . Blepharitis 02/03/2015  . Chronic anticoagulation 11/14/2014  . 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 Leg wound, left 08/19/2011  . DVT (deep venous thrombosis) 06/15/2011  . H/O PE (pulmonary embolism) 06/15/2011  . 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 [place], [place], [address] Phone: [phone]   Fax:  [phone]

## 2015-05-09 ENCOUNTER — Ambulatory Visit (HOSPITAL_COMMUNITY): Payer: Medicare Other | Admitting: Physical Therapy

## 2015-05-09 DIAGNOSIS — S81001D Unspecified open wound, right knee, subsequent encounter: Secondary | ICD-10-CM | POA: Diagnosis not present

## 2015-05-09 DIAGNOSIS — S81002D Unspecified open wound, left knee, subsequent encounter: Secondary | ICD-10-CM

## 2015-05-09 DIAGNOSIS — S91001D Unspecified open wound, right ankle, subsequent encounter: Secondary | ICD-10-CM

## 2015-05-09 DIAGNOSIS — S91002D Unspecified open wound, left ankle, subsequent encounter: Secondary | ICD-10-CM

## 2015-05-09 DIAGNOSIS — Z86711 Personal history of pulmonary embolism: Secondary | ICD-10-CM | POA: Diagnosis not present

## 2015-05-09 DIAGNOSIS — S81802D Unspecified open wound, left lower leg, subsequent encounter: Secondary | ICD-10-CM

## 2015-05-09 DIAGNOSIS — T148XXA Other injury of unspecified body region, initial encounter: Secondary | ICD-10-CM

## 2015-05-09 DIAGNOSIS — S81801D Unspecified open wound, right lower leg, subsequent encounter: Secondary | ICD-10-CM

## 2015-05-09 NOTE — Therapy (Signed)
[place], [place], [address] Phone: [phone]   Fax:  [phone]  Wound Care Therapy  Patient Details  Name: Matthew Wise MRN: [id number] Date of Birth: September 23, 1961 Referring Provider:  Redmond School, MD  Encounter Date: 05/09/2015      PT End of Session - 05/09/15 1816    Visit Number 62   Number of Visits 95   Date for PT Re-Evaluation 04/20/15   Authorization Type medicare   Authorization - Visit Number 48   Authorization - Number of Visits 51   PT Start Time 1194   PT Stop Time 1604   PT Time Calculation (min) 36 min   Activity Tolerance Patient tolerated treatment well   Behavior During Therapy [facility] for tasks assessed/performed      Past Medical History  Diagnosis Date  . DVT of leg (deep venous thrombosis)     rt. leg  . Inferior vena caval thrombosis     Chronic  . DVT (deep venous thrombosis) 06/15/2011  . H/O PE (pulmonary embolism) 06/15/2011  . 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 Chronic anticoagulation 11/14/2014    No past surgical history on file.  There were no vitals filed for this visit.  Visit Diagnosis:  Nonhealing nonsurgical wound  Open wound of knee, leg (except thigh), and ankle, complicated, right, subsequent encounter  Open wound of knee, leg (except thigh), and ankle, complicated, left, subsequent encounter                 Wound Therapy - 05/09/15 1813    Subjective Again, patient late for appointnment (13 minutes).  Pt comes today with bandages.  No pain reported   Patient and Family Stated Goals wound to heal   Date of Onset 02/06/15   Wound Properties Date First Assessed: 02/06/15 Time First Assessed: 1100 Wound Type: Other (Comment) Location: Leg Location Orientation: Right;Posterior Wound Description (Comments): Rt posterior mid calf region Present on  Admission: No   Dressing Type Moist to moist   Dressing Changed Changed   Dressing Status None   Dressing Change Frequency Every 3 days   Site / Wound Assessment Dusky;Granulation tissue   % Wound base Red or Granulating 100%  03/20/2015 was 90%   % Wound base Yellow 0%  03/20/2015 was 10%    Peri-wound Assessment Other (Comment);Intact;Edema;Induration  dry   Margins Attached edges (approximated)   Drainage Amount Minimal   Drainage Description Serosanguineous   Treatment Cleansed;Debridement (Selective)   Wound Properties Date First Assessed: 10/16/14 Time First Assessed: 1045 Wound Type: Other (Comment) Location: Leg Location Orientation: Right;Anterior Wound Description (Comments): Mid anterior Right Lower leg Present on Admission: No Final Assessment Date: 02/12/15 Final Assessment Time: 1015   Dressing Type Compression wrap  medihoney gel   Dressing Changed Changed   Dressing Change Frequency Other (Comment)   Site / Wound Assessment Red;Pink   % Wound base Red or Granulating 95%   % Wound base Yellow 5%   Peri-wound Assessment Intact   Drainage Amount Scant   Drainage Description Serosanguineous   Treatment Cleansed;Debridement (Selective)   Selective Debridement - Location anterior and posterior Rt LE; edges of wound and surrounding tissue    Selective Debridement - Tools Used Forceps   Selective Debridement - Tissue Removed nonviable tissue, slough   Wound Therapy - Clinical Statement Pt with continued approximation of wounds both  anteriorly and posteriorly.  Unable to complete manual therapy due to consistently being late for appointments.  Completed debridement, dressing change and compression bandaging.    Hydrotherapy Plan Debridement;Dressing change;Patient/family education;Other (comment)  CLD including manual and compression dressing with foam and    Wound Plan Continue Wound care and compression bandaging for Rt LE.   Dressing  medihoney, 2x2, short stretch     Dressing short stretch bandages, 1/2" foam   Decrease Necrotic Tissue to STG:  Pt to see surgeon 3 weeks    Increase Granulation Tissue to STG: 2- 3weeks.  Decrease depth of wound by 2 cm    Decrease Length/Width/Depth by (cm) LTG 1:  8 weeks - Pt wound to be decreased by 1x1.8x5 cm   Patient/Family will be able to  LTG 2:  Have compression on at all times to decrese swelling   Additional Wound Therapy Goal New as of 04/18/2015:  Wound to be healed ( therapist requires wound to be healed due to complicated hx of DVT and multiple deep wounds that occure quickly without trauma.  - 3 additional weeks    Goals/treatment plan/discharge plan were made with and agreed upon by patient/family Yes   Time For Goal Achievement Other (comment)   Wound Therapy - Potential for Goals Good                   PT Short Term Goals - 02/19/15 1647    PT SHORT TERM GOAL #2   Title Pt to have seen a surgeon    Time 1   Period Weeks   PT SHORT TERM GOAL #3   Title If surgeon want theapy to continue decrease depth by 2 xcm    Time 3   Period Weeks           PT Long Term Goals - 02/19/15 1648    PT LONG TERM GOAL #1   Title decrease wound size by 1.0x 1.8 x 5 cm    Time 8   Period Weeks   PT LONG TERM GOAL #2   Title Pt to be wearing compression at all times   Time 8   Period Weeks                Problem List Patient Active Problem List   Diagnosis Date Noted  . Cellulitis of right lower extremity   . UTI (lower urinary tract infection) 02/03/2015  . Cellulitis 02/03/2015  . Blepharitis 02/03/2015  . Chronic anticoagulation 11/14/2014  . 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 Leg wound, left 08/19/2011  . DVT (deep venous thrombosis) 06/15/2011  . H/O PE (pulmonary embolism) 06/15/2011  . 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

## 2015-05-13 ENCOUNTER — Ambulatory Visit (HOSPITAL_COMMUNITY): Payer: Medicare Other | Admitting: Physical Therapy

## 2015-05-13 DIAGNOSIS — S81802D Unspecified open wound, left lower leg, subsequent encounter: Secondary | ICD-10-CM

## 2015-05-13 DIAGNOSIS — S91001D Unspecified open wound, right ankle, subsequent encounter: Secondary | ICD-10-CM

## 2015-05-13 DIAGNOSIS — S81001D Unspecified open wound, right knee, subsequent encounter: Secondary | ICD-10-CM

## 2015-05-13 DIAGNOSIS — S81801D Unspecified open wound, right lower leg, subsequent encounter: Secondary | ICD-10-CM

## 2015-05-13 DIAGNOSIS — S81002D Unspecified open wound, left knee, subsequent encounter: Secondary | ICD-10-CM

## 2015-05-13 DIAGNOSIS — T148XXA Other injury of unspecified body region, initial encounter: Secondary | ICD-10-CM

## 2015-05-13 DIAGNOSIS — Z86711 Personal history of pulmonary embolism: Secondary | ICD-10-CM | POA: Diagnosis not present

## 2015-05-13 DIAGNOSIS — S91002D Unspecified open wound, left ankle, subsequent encounter: Secondary | ICD-10-CM

## 2015-05-13 NOTE — Therapy (Signed)
[place], [place], [address] Phone: [phone]   Fax:  [phone]  Wound Care Therapy  Patient Details  Name: Matthew Wise MRN: [id number] Date of Birth: October 25, 1961 Referring Provider:  Redmond School, MD  Encounter Date: 05/13/2015      PT End of Session - 05/13/15 1135    Visit Number 13   Number of Visits 34   Date for PT Re-Evaluation 04/20/15   Authorization Type medicare   Authorization - Visit Number 4   Authorization - Number of Visits 42   PT Start Time 1020   PT Stop Time 1100   PT Time Calculation (min) 40 min   Activity Tolerance Patient tolerated treatment well   Behavior During Therapy [facility] for tasks assessed/performed      Past Medical History  Diagnosis Date  . DVT of leg (deep venous thrombosis)     rt. leg  . Inferior vena caval thrombosis     Chronic  . DVT (deep venous thrombosis) 06/15/2011  . H/O PE (pulmonary embolism) 06/15/2011  . 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 Chronic anticoagulation 11/14/2014    No past surgical history on file.  There were no vitals filed for this visit.  Visit Diagnosis:  Nonhealing nonsurgical wound  Open wound of knee, leg (except thigh), and ankle, complicated, right, subsequent encounter  Open wound of knee, leg (except thigh), and ankle, complicated, left, subsequent encounter                 Wound Therapy - 05/13/15 1129    Subjective Pt comes today with juxtafit garment in place but no dressing over wound.    Patient and Family Stated Goals wound to heal   Date of Onset 02/06/15   Wound Properties Date First Assessed: 02/06/15 Time First Assessed: 1100 Wound Type: Other (Comment) Location: Leg Location Orientation: Right;Posterior Wound Description (Comments): Rt posterior mid calf region Present on Admission: No   Dressing Type  Moist to moist   Dressing Changed Changed   Dressing Status None   Dressing Change Frequency Every 3 days   Site / Wound Assessment Dusky;Granulation tissue   % Wound base Red or Granulating 100%  03/20/2015 was 90%   % Wound base Yellow 0%  03/20/2015 was 10%    Peri-wound Assessment Other (Comment);Intact;Edema;Induration  dry   Margins Attached edges (approximated)   Drainage Amount Scant   Drainage Description Serosanguineous   Treatment Cleansed;Debridement (Selective)   Wound Properties Date First Assessed: 10/16/14 Time First Assessed: 1045 Wound Type: Other (Comment) Location: Leg Location Orientation: Right;Anterior Wound Description (Comments): Mid anterior Right Lower leg Present on Admission: No Final Assessment Date: 02/12/15 Final Assessment Time: 1015   Dressing Type Compression wrap  medihoney gel   Dressing Changed Changed   Dressing Change Frequency Other (Comment)   Site / Wound Assessment Red;Pink   % Wound base Red or Granulating 100%   % Wound base Yellow 0%   Peri-wound Assessment Intact   Drainage Amount Scant   Drainage Description Serosanguineous   Treatment Cleansed;Debridement (Selective)   Selective Debridement - Location anterior and posterior Rt LE; edges of wound and surrounding tissue    Selective Debridement - Tools Used Forceps   Selective Debridement - Tissue Removed nonviable tissue, slough   Wound Therapy - Clinical Statement wounds continue to fill in and approximate.  Edges of  wounds debrided to encourage increased approximation.  Both with scab covering but once removed, not healed beneath.  Anticipate wounds to be healed in 2 more visits.    Hydrotherapy Plan Debridement;Dressing change;Patient/family education;Other (comment)  CLD including manual and compression dressing with foam and    Wound Plan Re-eval X 2 more visits.  Continue Wound care and compression bandaging for Rt LE.   Dressing  medihoney, 2x2, short stretch    Dressing short  stretch bandages, 1/2" foam   Decrease Necrotic Tissue to STG:  Pt to see surgeon 3 weeks    Increase Granulation Tissue to STG: 2- 3weeks.  Decrease depth of wound by 2 cm    Decrease Length/Width/Depth by (cm) LTG 1:  8 weeks - Pt wound to be decreased by 1x1.8x5 cm   Patient/Family will be able to  LTG 2:  Have compression on at all times to decrese swelling   Additional Wound Therapy Goal New as of 04/18/2015:  Wound to be healed ( therapist requires wound to be healed due to complicated hx of DVT and multiple deep wounds that occure quickly without trauma.  - 3 additional weeks    Goals/treatment plan/discharge plan were made with and agreed upon by patient/family Yes   Time For Goal Achievement Other (comment)   Wound Therapy - Potential for Goals Good                   PT Short Term Goals - 02/19/15 1647    PT SHORT TERM GOAL #2   Title Pt to have seen a surgeon    Time 1   Period Weeks   PT SHORT TERM GOAL #3   Title If surgeon want theapy to continue decrease depth by 2 xcm    Time 3   Period Weeks           PT Long Term Goals - 02/19/15 1648    PT LONG TERM GOAL #1   Title decrease wound size by 1.0x 1.8 x 5 cm    Time 8   Period Weeks   PT LONG TERM GOAL #2   Title Pt to be wearing compression at all times   Time 8   Period Weeks                Problem List Patient Active Problem List   Diagnosis Date Noted  . Cellulitis of right lower extremity   . UTI (lower urinary tract infection) 02/03/2015  . Cellulitis 02/03/2015  . Blepharitis 02/03/2015  . Chronic anticoagulation 11/14/2014  . 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 Leg wound, left 08/19/2011  . DVT (deep venous thrombosis) 06/15/2011  . H/O PE (pulmonary embolism) 06/15/2011  . 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

## 2015-05-16 ENCOUNTER — Ambulatory Visit (HOSPITAL_COMMUNITY): Payer: Medicare Other | Admitting: Physical Therapy

## 2015-05-16 DIAGNOSIS — S81001D Unspecified open wound, right knee, subsequent encounter: Secondary | ICD-10-CM

## 2015-05-16 DIAGNOSIS — S81801D Unspecified open wound, right lower leg, subsequent encounter: Secondary | ICD-10-CM

## 2015-05-16 DIAGNOSIS — S91002D Unspecified open wound, left ankle, subsequent encounter: Secondary | ICD-10-CM

## 2015-05-16 DIAGNOSIS — T148XXA Other injury of unspecified body region, initial encounter: Secondary | ICD-10-CM

## 2015-05-16 DIAGNOSIS — S81802D Unspecified open wound, left lower leg, subsequent encounter: Secondary | ICD-10-CM

## 2015-05-16 DIAGNOSIS — S91001D Unspecified open wound, right ankle, subsequent encounter: Secondary | ICD-10-CM | POA: Diagnosis not present

## 2015-05-16 DIAGNOSIS — Z86711 Personal history of pulmonary embolism: Secondary | ICD-10-CM | POA: Diagnosis not present

## 2015-05-16 DIAGNOSIS — S81002D Unspecified open wound, left knee, subsequent encounter: Secondary | ICD-10-CM

## 2015-05-16 NOTE — Therapy (Signed)
[place], [place], [address] Phone: [phone]   Fax:  [phone]  Wound Care Therapy  Patient Details  Name: JADARION HALBIG MRN: [id number] Date of Birth: 1960-12-28 Referring Provider:  Redmond School, MD  Encounter Date: 05/16/2015      PT End of Session - 05/16/15 1341    Visit Number 69   Number of Visits 10   Date for PT Re-Evaluation 04/20/15   Authorization Type medicare   Authorization - Visit Number 13   Authorization - Number of Visits 20   PT Start Time 1300   PT Stop Time 1340   PT Time Calculation (min) 40 min   Activity Tolerance Patient tolerated treatment well   Behavior During Therapy [facility] for tasks assessed/performed      Past Medical History  Diagnosis Date  . DVT of leg (deep venous thrombosis)     rt. leg  . Inferior vena caval thrombosis     Chronic  . DVT (deep venous thrombosis) 06/15/2011  . H/O PE (pulmonary embolism) 06/15/2011  . 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 Chronic anticoagulation 11/14/2014    No past surgical history on file.  There were no vitals filed for this visit.  Visit Diagnosis:  Nonhealing nonsurgical wound  Open wound of knee, leg (except thigh), and ankle, complicated, right, subsequent encounter  Open wound of knee, leg (except thigh), and ankle, complicated, left, subsequent encounter                 Wound Therapy - 05/16/15 1340    Subjective Pt comes today with juxtafit garment in place but no dressing over wound.    Patient and Family Stated Goals wound to heal   Date of Onset 02/06/15   Wound Properties Date First Assessed: 02/06/15 Time First Assessed: 1100 Wound Type: Other (Comment) Location: Leg Location Orientation: Right;Posterior Wound Description (Comments): Rt posterior mid calf region Present on Admission: No   Dressing Type  Moist to moist   Dressing Changed Changed   Dressing Status None   Dressing Change Frequency Every 3 days   Site / Wound Assessment Dusky;Granulation tissue   % Wound base Red or Granulating 100%  03/20/2015 was 90%   % Wound base Yellow 0%  03/20/2015 was 10%    Peri-wound Assessment Other (Comment);Intact;Edema;Induration  dry   Wound Length (cm) 0.3 cm   Wound Width (cm) 0.3 cm   Wound Depth (cm) 0 cm   Margins Attached edges (approximated)   Drainage Amount Scant   Drainage Description Serosanguineous   Treatment Cleansed;Debridement (Selective)   Wound Properties Date First Assessed: 10/16/14 Time First Assessed: 1045 Wound Type: Other (Comment) Location: Leg Location Orientation: Right;Anterior Wound Description (Comments): Mid anterior Right Lower leg Present on Admission: No Final Assessment Date: 02/12/15 Final Assessment Time: 1015   Dressing Type Compression wrap  medihoney gel   Dressing Changed Changed   Dressing Change Frequency Other (Comment)   Site / Wound Assessment Red;Pink   % Wound base Red or Granulating 100%   % Wound base Yellow 0%   Peri-wound Assessment Intact   Drainage Amount Scant   Drainage Description Serosanguineous   Treatment Cleansed;Debridement (Selective)   Selective Debridement - Location anterior and posterior Rt LE; edges of wound and surrounding tissue    Selective Debridement - Tools Used Forceps   Selective Debridement - Tissue  Removed nonviable tissue, slough   Wound Therapy - Clinical Statement Only with very small opening remaining.  Continued wound care and dressing change using compression bandaging.  Pt requested new order for garment.  Order faxed to MD.     Hydrotherapy Plan Debridement;Dressing change;Patient/family education;Other (comment)     Wound Plan Re-eval next visit with anticipated discharge.  Give new order for compression garment.    Dressing  medihoney, 2x2, short stretch    Dressing short stretch bandages, 1/2" foam    Decrease Necrotic Tissue to STG:  Pt to see surgeon 3 weeks    Increase Granulation Tissue to STG: 2- 3weeks.  Decrease depth of wound by 2 cm    Decrease Length/Width/Depth by (cm) LTG 1:  8 weeks - Pt wound to be decreased by 1x1.8x5 cm   Patient/Family will be able to  LTG 2:  Have compression on at all times to decrese swelling   Additional Wound Therapy Goal New as of 04/18/2015:  Wound to be healed ( therapist requires wound to be healed due to complicated hx of DVT and multiple deep wounds that occure quickly without trauma.  - 3 additional weeks    Goals/treatment plan/discharge plan were made with and agreed upon by patient/family Yes   Time For Goal Achievement Other (comment)   Wound Therapy - Potential for Goals Good                   PT Short Term Goals - 02/19/15 1647    PT SHORT TERM GOAL #2   Title Pt to have seen a surgeon    Time 1   Period Weeks   PT SHORT TERM GOAL #3   Title If surgeon want theapy to continue decrease depth by 2 xcm    Time 3   Period Weeks           PT Long Term Goals - 02/19/15 1648    PT LONG TERM GOAL #1   Title decrease wound size by 1.0x 1.8 x 5 cm    Time 8   Period Weeks   PT LONG TERM GOAL #2   Title Pt to be wearing compression at all times   Time 8   Period Weeks               Problem List Patient Active Problem List   Diagnosis Date Noted  . Cellulitis of right lower extremity   . UTI (lower urinary tract infection) 02/03/2015  . Cellulitis 02/03/2015  . Blepharitis 02/03/2015  . Chronic anticoagulation 11/14/2014  . 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 Leg wound, left 08/19/2011  . DVT (deep venous thrombosis) 06/15/2011  . H/O PE (pulmonary embolism) 06/15/2011  . 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

## 2015-05-20 ENCOUNTER — Ambulatory Visit (HOSPITAL_COMMUNITY): Payer: Medicare Other | Admitting: Physical Therapy

## 2015-05-22 ENCOUNTER — Encounter (HOSPITAL_COMMUNITY): Payer: Medicare Other | Attending: Oncology | Admitting: Oncology

## 2015-05-22 ENCOUNTER — Ambulatory Visit (HOSPITAL_COMMUNITY): Payer: Medicare Other | Admitting: Oncology

## 2015-05-22 ENCOUNTER — Other Ambulatory Visit (HOSPITAL_COMMUNITY): Payer: Medicare Other

## 2015-05-22 ENCOUNTER — Encounter (HOSPITAL_COMMUNITY): Payer: Self-pay | Admitting: Oncology

## 2015-05-22 ENCOUNTER — Encounter (HOSPITAL_BASED_OUTPATIENT_CLINIC_OR_DEPARTMENT_OTHER): Payer: Medicare Other

## 2015-05-22 VITALS — BP 144/79 | HR 63 | Temp 98.8°F | Resp 18 | Wt 281.1 lb

## 2015-05-22 DIAGNOSIS — R599 Enlarged lymph nodes, unspecified: Secondary | ICD-10-CM | POA: Diagnosis not present

## 2015-05-22 DIAGNOSIS — Z86711 Personal history of pulmonary embolism: Secondary | ICD-10-CM

## 2015-05-22 DIAGNOSIS — R590 Localized enlarged lymph nodes: Secondary | ICD-10-CM

## 2015-05-22 DIAGNOSIS — I82403 Acute embolism and thrombosis of unspecified deep veins of lower extremity, bilateral: Secondary | ICD-10-CM | POA: Diagnosis not present

## 2015-05-22 DIAGNOSIS — Z7901 Long term (current) use of anticoagulants: Secondary | ICD-10-CM | POA: Diagnosis not present

## 2015-05-22 DIAGNOSIS — I82409 Acute embolism and thrombosis of unspecified deep veins of unspecified lower extremity: Secondary | ICD-10-CM

## 2015-05-22 DIAGNOSIS — D688 Other specified coagulation defects: Secondary | ICD-10-CM

## 2015-05-22 DIAGNOSIS — I82401 Acute embolism and thrombosis of unspecified deep veins of right lower extremity: Secondary | ICD-10-CM

## 2015-05-22 LAB — CBC WITH DIFFERENTIAL/PLATELET
BASOS ABS: 0 10*3/uL (ref 0.0–0.1)
BASOS PCT: 0 % (ref 0–1)
EOS PCT: 1 % (ref 0–5)
Eosinophils Absolute: 0 10*3/uL (ref 0.0–0.7)
HCT: 47.2 % (ref 39.0–52.0)
Hemoglobin: 16.5 g/dL (ref 13.0–17.0)
Lymphocytes Relative: 42 % (ref 12–46)
Lymphs Abs: 1.9 10*3/uL (ref 0.7–4.0)
MCH: 31.1 pg (ref 26.0–34.0)
MCHC: 35 g/dL (ref 30.0–36.0)
MCV: 88.9 fL (ref 78.0–100.0)
Monocytes Absolute: 0.5 10*3/uL (ref 0.1–1.0)
Monocytes Relative: 10 % (ref 3–12)
Neutro Abs: 2.1 10*3/uL (ref 1.7–7.7)
Neutrophils Relative %: 47 % (ref 43–77)
Platelets: 192 10*3/uL (ref 150–400)
RBC: 5.31 MIL/uL (ref 4.22–5.81)
RDW: 13.9 % (ref 11.5–15.5)
WBC: 4.5 10*3/uL (ref 4.0–10.5)

## 2015-05-22 LAB — COMPREHENSIVE METABOLIC PANEL
ALT: 23 U/L (ref 17–63)
AST: 27 U/L (ref 15–41)
Albumin: 4.1 g/dL (ref 3.5–5.0)
Alkaline Phosphatase: 72 U/L (ref 38–126)
Anion gap: 8 (ref 5–15)
BILIRUBIN TOTAL: 1.9 mg/dL — AB (ref 0.3–1.2)
BUN: 11 mg/dL (ref 6–20)
CO2: 27 mmol/L (ref 22–32)
CREATININE: 1.17 mg/dL (ref 0.61–1.24)
Calcium: 9 mg/dL (ref 8.9–10.3)
Chloride: 104 mmol/L (ref 101–111)
GFR calc Af Amer: >60 mL/min (ref >60)
Glucose, Bld: 79 mg/dL (ref 65–99)
Potassium: 3.6 mmol/L (ref 3.5–5.1)
Sodium: 139 mmol/L (ref 135–145)
Total Protein: 8 g/dL (ref 6.5–8.1)

## 2015-05-22 MED ORDER — RIVAROXABAN 20 MG PO TABS: 20.0000 mg | ORAL_TABLET | Freq: Every day | ORAL | Status: DC

## 2015-05-22 MED ORDER — NAPROXEN 500 MG PO TABS: 500.0000 mg | ORAL_TABLET | Freq: Two times a day (BID) | ORAL | Status: DC | PRN

## 2015-05-22 MED ORDER — OXYCODONE HCL 5 MG PO CAPS: 5.0000 mg | ORAL_CAPSULE | ORAL | Status: DC | PRN

## 2015-05-22 NOTE — Progress Notes (Signed)
[name], MD [place] [address]  DVT (deep venous thrombosis), bilateral - Plan: naproxen (NAPROSYN) 500 MG tablet, CBC with Differential, Ferritin  Inguinal adenopathy - Plan: US Venous Img Lower Unilateral Right  DVT (deep venous thrombosis), unspecified laterality - Plan: oxycodone (OXY-IR) 5 MG capsule  DVT (deep venous thrombosis), right - Plan: rivaroxaban (XARELTO) 20 MG TABS tablet  CURRENT THERAPY: Xarelto 20 mg daily due to Coumadin failure with acute DVT (likely due to noncompliance with Coumadin)  INTERVAL HISTORY: RAYFIELD BEEM 54 y.o. 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 He was then started on Xarelto loading dose and transitioned to the maintenance dose after 21 days.   I personally reviewed and went over laboratory results with the patient.  The results are noted within this dictation.  I personally reviewed and went over radiographic studies with the patient.  The results are noted within this dictation.  [place] in March demonstrated mildly enlarged right inguinal nodes.  This will need followed, but is likely secondary to LE wound and poor healing.  He denies any complaints but notes that he does miss a few doses of Xarelto occasionally.  Education provided and compliance strongly urged.  Risks and benefits as previously documented discussed regarding Xarelto therapy.  He denies any complaints and ROS questioning is negative.    Past Medical History  Diagnosis Date  . DVT of leg (deep venous thrombosis)     rt. leg    . Inferior vena caval thrombosis     Chronic  . DVT (deep venous thrombosis) 06/15/2011  . H/O PE (pulmonary embolism) 06/15/2011  . 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 Chronic anticoagulation 11/14/2014    has Post-phlebitic syndrome; DVT (deep venous thrombosis); H/O PE (pulmonary embolism); Congenital inferior vena cava interruption; Leg wound, left; IVC (inferior vena cava obstruction); Coumadin failure; Chronic anticoagulation; UTI (lower urinary tract infection); Cellulitis; Blepharitis; and Cellulitis of right lower extremity on his problem list.     has No Known Allergies.  Mr. Seidman had no medications administered during this visit.  History reviewed. No pertinent past surgical history.  Denies any headaches, dizziness, double vision, fevers, chills, night sweats, nausea, vomiting, diarrhea, constipation, chest pain, heart palpitations, shortness of breath, blood in stool, black tarry stool, urinary pain, urinary burning, urinary frequency, hematuria.   PHYSICAL EXAMINATION  ECOG PERFORMANCE STATUS: 1 - Symptomatic but completely ambulatory  Filed Vitals:   05/22/15 1252  BP: 144/79  Pulse: 63  Temp: 98.8 F (37.1 C)  Resp: 18    GENERAL:alert, no distress, well nourished, well developed, comfortable, cooperative and smiling SKIN: skin color, texture, turgor are normal, no rashes or significant lesions HEAD: Normocephalic, No masses, lesions, tenderness or abnormalities EYES: normal, PERRLA, EOMI, Conjunctiva are pink and non-injected EARS: External ears normal OROPHARYNX:lips, buccal mucosa, and tongue normal and mucous membranes are moist  NECK: supple, no stridor, non-tender, trachea midline LYMPH:  no palpable lymphadenopathy BREAST:not examined LUNGS: clear to auscultation  HEART: regular rate & rhythm,  no murmurs and no gallops ABDOMEN:abdomen soft and normal bowel  sounds BACK: Back symmetric, no curvature. EXTREMITIES:less then 2 second capillary refill, no joint deformities, effusion, or inflammation, no skin discoloration, no cyanosis.  Lower extremities wrapped with healing leg wounds. NEURO: alert & oriented x 3 with fluent speech, no focal motor/sensory deficits, gait normal   LABORATORY DATA: CBC    Component Value Date/Time   WBC 4.5 05/22/2015 1235   RBC 5.31 05/22/2015 1235   RBC 5.46 12/10/2008 0600   HGB 16.5 05/22/2015 1235   HCT 47.2 05/22/2015 1235   PLT 192 05/22/2015 1235   MCV 88.9 05/22/2015 1235   MCH 31.1 05/22/2015 1235   MCHC 35.0 05/22/2015 1235   RDW 13.9 05/22/2015 1235   LYMPHSABS 1.9 05/22/2015 1235   MONOABS 0.5 05/22/2015 1235   EOSABS 0.0 05/22/2015 1235   BASOSABS 0.0 05/22/2015 1235      Chemistry      Component Value Date/Time   NA 139 05/22/2015 1235   K 3.6 05/22/2015 1235   CL 104 05/22/2015 1235   CO2 27 05/22/2015 1235   BUN 11 05/22/2015 1235   CREATININE 1.17 05/22/2015 1235      Component Value Date/Time   CALCIUM 9.0 05/22/2015 1235   ALKPHOS 72 05/22/2015 1235   AST 27 05/22/2015 1235   ALT 23 05/22/2015 1235   BILITOT 1.9* 05/22/2015 1235        ASSESSMENT AND PLAN:  DVT (deep venous thrombosis) 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 Patient seen by Dr. Christoper Allegra at [facility] who found that he also has sickle cell trait which increases the patient's risk of DVTs. He did not have cardiolipin antibodies, nor beta-2 glycoprotein antibodies. It is believed by both, Dr. Mare Ferrari and [place], that he is not a good candidate for ongoing work and is a candidate for permanent disability.    Currently on Xarelto for supposed Coumadin failure (although compliance was more likely) when he developed a DVT in lower extremity  while on Coumadin.   Continue Xarelto daily as prescribed.  Compliance is encouraged.  He admits to missing a few doses.  Labs in 4 months: CBC diff, ferritin.    [place] of right inguinal nodes to follow enlarged nodes noted in past in 3 months  Refill Oxycodone, Xarelto and Naprosyn provided today.    Rx for compression stockings.  Return in 4 months for follow-up.     THERAPY PLAN:  Continue Xarelto daily.  All questions were answered. The patient knows to call the clinic with any problems, questions or concerns. We can certainly see the patient much sooner if necessary.  Patient and plan discussed with Dr. Ancil Linsey and she is in agreement with the aforementioned.   This note is electronically signed by: Robynn Pane 05/22/2015 1:58 PM

## 2015-05-22 NOTE — Patient Instructions (Signed)
..  [place] at [facility] Discharge Instructions  RECOMMENDATIONS MADE BY THE CONSULTANT AND ANY TEST RESULTS WILL BE SENT TO YOUR REFERRING PHYSICIAN.  U/S in 3 months of rt inguinal area Prescription given for compression stockings Presciption given for oxy ir  Thank you for choosing [place] at [facility] to provide your oncology and hematology care.  To afford each patient quality time with our provider, please arrive at least 15 minutes before your scheduled appointment time.    You need to re-schedule your appointment should you arrive 10 or more minutes late.  We strive to give you quality time with our providers, and arriving late affects you and other patients whose appointments are after yours.  Also, if you no show three or more times for appointments you may be dismissed from the clinic at the providers discretion.     Again, thank you for choosing [facility]  Our hope is that these requests will decrease the amount of time that you wait before being seen by our physicians.       _____________________________________________________________  Should you have questions after your visit to [facility], please contact our office at [phone] between the hours of 8:30 a.m. and 4:30 p.m.  Voicemails left after 4:30 p.m. will not be returned until the following business day.  For prescription refill requests, have your pharmacy contact our office.

## 2015-05-22 NOTE — Assessment & Plan Note (Addendum)
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 Patient seen by Dr. Christoper Allegra at [facility] who found that he also has sickle cell trait which increases the patient's risk of DVTs. He did not have cardiolipin antibodies, nor beta-2 glycoprotein antibodies. It is believed by both, Dr. Mare Ferrari and [place], that he is not a good candidate for ongoing work and is a candidate for permanent disability.    Currently on Xarelto for supposed Coumadin failure (although compliance was more likely) when he developed a DVT in lower extremity while on Coumadin.   Continue Xarelto daily as prescribed.  Compliance is encouraged.  He admits to missing a few doses.  Labs in 4 months: CBC diff, ferritin.    [place] of right inguinal nodes to follow enlarged nodes noted in past in 3 months  Refill Oxycodone, Xarelto and Naprosyn provided today.    Rx for compression stockings.  Return in 4 months for follow-up.

## 2015-05-22 NOTE — Progress Notes (Signed)
..  [place] at [facility] Discharge Instructions  RECOMMENDATIONS MADE BY THE CONSULTANT AND ANY TEST RESULTS WILL BE SENT TO YOUR REFERRING PHYSICIAN.  Exam per Caroleen Hamman PA  Thank you for choosing [place] at [facility] to provide your oncology and hematology care.  To afford each patient quality time with our provider, please arrive at least 15 minutes before your scheduled appointment time.    You need to re-schedule your appointment should you arrive 10 or more minutes late.  We strive to give you quality time with our providers, and arriving late affects you and other patients whose appointments are after yours.  Also, if you no show three or more times for appointments you may be dismissed from the clinic at the providers discretion.     Again, thank you for choosing [facility].  Our hope is that these requests will decrease the amount of time that you wait before being seen by our physicians.       _____________________________________________________________  Should you have questions after your visit to [facility], please contact our office at [phone] between the hours of 8:30 a.m. and 4:30 p.m.  Voicemails left after 4:30 p.m. will not be returned until the following business day.  For prescription refill requests, have your pharmacy contact our office.

## 2015-05-23 ENCOUNTER — Ambulatory Visit (HOSPITAL_COMMUNITY): Payer: Medicare Other | Admitting: Physical Therapy

## 2015-05-23 NOTE — Progress Notes (Signed)
LABS DRAWN

## 2015-05-28 ENCOUNTER — Ambulatory Visit (HOSPITAL_COMMUNITY): Payer: Medicare Other | Admitting: Physical Therapy

## 2015-05-30 ENCOUNTER — Ambulatory Visit (HOSPITAL_COMMUNITY): Payer: Medicare Other | Admitting: Physical Therapy

## 2015-06-24 ENCOUNTER — Ambulatory Visit (HOSPITAL_COMMUNITY): Payer: Medicare Other | Attending: Hematology and Oncology | Admitting: Physical Therapy

## 2015-06-24 DIAGNOSIS — T148 Other injury of unspecified body region: Secondary | ICD-10-CM | POA: Insufficient documentation

## 2015-06-24 DIAGNOSIS — X58XXXD Exposure to other specified factors, subsequent encounter: Secondary | ICD-10-CM | POA: Insufficient documentation

## 2015-06-24 DIAGNOSIS — S81802D Unspecified open wound, left lower leg, subsequent encounter: Secondary | ICD-10-CM | POA: Insufficient documentation

## 2015-06-24 DIAGNOSIS — S81002D Unspecified open wound, left knee, subsequent encounter: Secondary | ICD-10-CM | POA: Insufficient documentation

## 2015-06-24 DIAGNOSIS — S91001D Unspecified open wound, right ankle, subsequent encounter: Secondary | ICD-10-CM | POA: Diagnosis not present

## 2015-06-24 DIAGNOSIS — S81801D Unspecified open wound, right lower leg, subsequent encounter: Secondary | ICD-10-CM | POA: Diagnosis not present

## 2015-06-24 DIAGNOSIS — S91002D Unspecified open wound, left ankle, subsequent encounter: Secondary | ICD-10-CM | POA: Insufficient documentation

## 2015-06-24 DIAGNOSIS — S81001D Unspecified open wound, right knee, subsequent encounter: Secondary | ICD-10-CM | POA: Diagnosis not present

## 2015-06-24 DIAGNOSIS — T148XXA Other injury of unspecified body region, initial encounter: Secondary | ICD-10-CM

## 2015-06-24 NOTE — Therapy (Signed)
[place], [place], [address] Phone: [phone]   Fax:  [phone]  Wound Care Therapy Re-evaluation  Patient Details  Name: Matthew Wise MRN: [id number] Date of Birth: 10-06-1961 Referring Provider:  Redmond School, MD  Encounter Date: 06/24/2015      PT End of Session - 06/24/15 1809    Visit Number 8   Number of Visits 39   Date for PT Re-Evaluation 04/20/15   Authorization Type medicare   Authorization - Visit Number 58   Authorization - Number of Visits 31   PT Start Time 1600   PT Stop Time 1615   PT Time Calculation (min) 15 min   Activity Tolerance Patient tolerated treatment well   Behavior During Therapy [facility] for tasks assessed/performed      Past Medical History  Diagnosis Date  . DVT of leg (deep venous thrombosis)     rt. leg  . Inferior vena caval thrombosis     Chronic  . DVT (deep venous thrombosis) 06/15/2011  . H/O PE (pulmonary embolism) 06/15/2011  . 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 Chronic anticoagulation 11/14/2014    No past surgical history on file.  There were no vitals filed for this visit.  Visit Diagnosis:  Nonhealing nonsurgical wound  Open wound of knee, leg (except thigh), and ankle, complicated, right, subsequent encounter  Open wound of knee, leg (except thigh), and ankle, complicated, left, subsequent encounter                 Wound Therapy - 06/24/15 1755    Subjective PT returns today after over 1 month absense from clinic.  PT states he thinks his wounds are healed.   Patient and Family Stated Goals wound to heal   Date of Onset 02/06/15   Wound Properties Date First Assessed: 02/06/15 Time First Assessed: 1100 Wound Type: Other (Comment) Location: Leg Location Orientation: Right;Posterior Wound Description (Comments): Rt posterior mid calf region  Present on Admission: No   Dressing Type None   Dressing Status None   Dressing Change Frequency Other (Comment)   Site / Wound Assessment Clean;Dry   % Wound base Red or Granulating 100%   % Wound base Yellow 0%   Peri-wound Assessment Other (Comment);Intact;Edema;Induration   Margins Attached edges (approximated)   Drainage Amount None   Treatment Other (Comment)   Wound Properties Date First Assessed: 10/16/14 Time First Assessed: 1045 Wound Type: Other (Comment) Location: Leg Location Orientation: Right;Anterior Wound Description (Comments): Mid anterior Right Lower leg Present on Admission: No Final Assessment Date: 02/12/15 Final Assessment Time: 1015   Dressing Type None  medihoney gel   % Wound base Red or Granulating 100%   % Wound base Yellow 0%   Peri-wound Assessment Intact   Drainage Amount None   Treatment Other (Comment)   Selective Debridement - Location anterior and posterior Rt LE; edges of wound and surrounding tissue    Selective Debridement - Tools Used Forceps   Selective Debridement - Tissue Removed nonviable tissue, slough   Wound Therapy - Clinical Statement Pt returns with knee high compression garments in place bilaterally.   Pt without open wound presently and without further need of skilled intervention.  Pt given signed order from MD to obtain new compression garment.  Educated patient on importance of getting new garment every 6 months.  Pt without further questions.  Wound Plan discontinue skilled services as wounds are healed.   Decrease Necrotic Tissue to STG:  Pt to see surgeon 3 weeks    Decrease Necrotic Tissue - Progress Discontinued (comment)   Increase Granulation Tissue to STG: 2- 3weeks.  Decrease depth of wound by 2 cm    Increase Granulation Tissue - Progress Met   Decrease Length/Width/Depth by (cm) LTG 1:  8 weeks - Pt wound to be decreased by 1x1.8x5 cm   Decrease Length/Width/Depth - Progress Met   Patient/Family will be able to  LTG 2:   Have compression on at all times to decrese swelling   Patient/Family Instruction Goal - Progress Met   Additional Wound Therapy Goal New as of 04/18/2015:  Wound to be healed ( therapist requires wound to be healed due to complicated hx of DVT and multiple deep wounds that occure quickly without trauma.  - 3 additional weeks    Additional Wound Therapy Goal - Progress Met   Goals/treatment plan/discharge plan were made with and agreed upon by patient/family Yes   Time For Goal Achievement Other (comment)   Wound Therapy - Potential for Goals Good                         G-Codes - 07/12/15 1600    Functional Assessment Tool Used clinical judgement    Functional Limitation Other PT primary   Other PT Primary Goal Status (W8088) At least 20 percent but less than 40 percent impaired, limited or restricted   Other PT Primary Discharge Status (P1031) At least 1 percent but less than 20 percent impaired, limited or restricted       Problem List Patient Active Problem List   Diagnosis Date Noted  . Cellulitis of right lower extremity   . UTI (lower urinary tract infection) 02/03/2015  . Cellulitis 02/03/2015  . Blepharitis 02/03/2015  . Chronic anticoagulation 11/14/2014  . 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 Leg wound, left 08/19/2011  . DVT (deep venous thrombosis) 06/15/2011  . H/O PE (pulmonary embolism) 06/15/2011  . 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  Patient goals were met. Patient is being discharged due to meeting the stated rehab goals.  ?????    [name], [place] CLT [phone]

## 2015-08-12 ENCOUNTER — Ambulatory Visit (HOSPITAL_COMMUNITY): Admission: RE | Admit: 2015-08-12 | Payer: Medicare Other | Source: Ambulatory Visit

## 2015-08-30 DIAGNOSIS — A599 Trichomoniasis, unspecified: Secondary | ICD-10-CM | POA: Diagnosis not present

## 2015-09-06 NOTE — Telephone Encounter (Signed)
Called pt about missed appointment.  Pt had forgotten her appointment.  [name], [place] CLT [phone]

## 2015-09-18 ENCOUNTER — Encounter (HOSPITAL_COMMUNITY): Payer: Medicare Other | Attending: Hematology & Oncology | Admitting: Hematology & Oncology

## 2015-09-18 ENCOUNTER — Encounter (HOSPITAL_COMMUNITY): Payer: Self-pay | Admitting: Hematology & Oncology

## 2015-09-18 ENCOUNTER — Other Ambulatory Visit (HOSPITAL_COMMUNITY): Payer: Self-pay

## 2015-09-18 DIAGNOSIS — Z720 Tobacco use: Secondary | ICD-10-CM | POA: Diagnosis not present

## 2015-09-18 DIAGNOSIS — Z86711 Personal history of pulmonary embolism: Secondary | ICD-10-CM | POA: Diagnosis not present

## 2015-09-18 DIAGNOSIS — Z7901 Long term (current) use of anticoagulants: Secondary | ICD-10-CM | POA: Diagnosis not present

## 2015-09-18 DIAGNOSIS — I82403 Acute embolism and thrombosis of unspecified deep veins of lower extremity, bilateral: Secondary | ICD-10-CM | POA: Insufficient documentation

## 2015-09-18 LAB — CBC WITH DIFFERENTIAL/PLATELET
BASOS PCT: 0 %
Basophils Absolute: 0 10*3/uL (ref 0.0–0.1)
Eosinophils Absolute: 0.1 10*3/uL (ref 0.0–0.7)
Eosinophils Relative: 1 %
HCT: 44.7 % (ref 39.0–52.0)
Hemoglobin: 15.7 g/dL (ref 13.0–17.0)
LYMPHS PCT: 39 %
Lymphs Abs: 1.9 10*3/uL (ref 0.7–4.0)
MCH: 31.3 pg (ref 26.0–34.0)
MCHC: 35.1 g/dL (ref 30.0–36.0)
MCV: 89 fL (ref 78.0–100.0)
Monocytes Absolute: 0.6 10*3/uL (ref 0.1–1.0)
Monocytes Relative: 11 %
NEUTROS ABS: 2.3 10*3/uL (ref 1.7–7.7)
NEUTROS PCT: 49 %
PLATELETS: 162 10*3/uL (ref 150–400)
RBC: 5.02 MIL/uL (ref 4.22–5.81)
RDW: 13.8 % (ref 11.5–15.5)
WBC: 4.9 10*3/uL (ref 4.0–10.5)

## 2015-09-18 LAB — FERRITIN: FERRITIN: 140 ng/mL (ref 24–336)

## 2015-09-18 NOTE — Patient Instructions (Signed)
[place] at [facility] Discharge Instructions  RECOMMENDATIONS MADE BY THE CONSULTANT AND ANY TEST RESULTS WILL BE SENT TO YOUR REFERRING PHYSICIAN.  Exam and discussion by Dr. Youlanda Roys need to get a Primary care physician , names and numbers provided.  Labs today Referral to GI  Follow-up in 4 months.     Thank you for choosing [place] at [facility] to provide your oncology and hematology care.  To afford each patient quality time with our provider, please arrive at least 15 minutes before your scheduled appointment time.    You need to re-schedule your appointment should you arrive 10 or more minutes late.  We strive to give you quality time with our providers, and arriving late affects you and other patients whose appointments are after yours.  Also, if you no show three or more times for appointments you may be dismissed from the clinic at the providers discretion.     Again, thank you for choosing [facility].  Our hope is that these requests will decrease the amount of time that you wait before being seen by our physicians.       _____________________________________________________________  Should you have questions after your visit to [facility], please contact our office at [phone] between the hours of 8:30 a.m. and 4:30 p.m.  Voicemails left after 4:30 p.m. will not be returned until the following business day.  For prescription refill requests, have your pharmacy contact our office.

## 2015-09-18 NOTE — Progress Notes (Signed)
Matthew Wise presented for labwork. Labs per MD order drawn via Peripheral Line 23 gauge needle inserted in right AC  Good blood return present. Procedure without incident.  Needle removed intact. Patient tolerated procedure well.

## 2015-09-18 NOTE — Progress Notes (Signed)
[name], MD [place] [address]  DVT (deep venous thrombosis), bilateral - Plan: CBC with Differential, Ferritin  CURRENT THERAPY: Xarelto 20 mg daily due to Coumadin failure with acute DVT (likely due to noncompliance with Coumadin)  INTERVAL HISTORY: Matthew Wise 54 y.o. 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 He was then started on Xarelto loading dose and transitioned to the maintenance dose after 21 days.   Matthew Wise is here alone today.  He takes his Xarelto daily and denies any bleeding. He says he hasn't been in any pain lately, so he's been doing okay. He says he still has all of his pain pills and his naproxen.  He confirms sleeping and eating well, and nothing else out of the ordinary.  When asked if he's had a colonoscopy yet, he states  "no, but, it's probably time."  Matthew Wise says that Dr. Gerarda Fraction used to be his PCP, but he called about a week or two ago and he hadn't been in so long that they couldn't find his name.  He denies having any blood in his stool, and denies any change in his bowel habits.  He is a smoker and he says he smokes about a pack a day.  He confirms that his right leg gives him the most issues with regards to swelling. But denies any new symptoms or change in prior symptoms.   Past Medical History  Diagnosis Date  . DVT of leg (deep venous thrombosis) (HCC)     rt. leg  . Inferior vena caval thrombosis (HCC)     Chronic  . DVT (deep venous thrombosis) ([place]) 06/15/2011  . H/O PE (pulmonary  embolism) 06/15/2011  . 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 Chronic anticoagulation 11/14/2014   He says that his brother passed of throat cancer, and his brother was also a smoker. He states that his mother had two sisters and a brother with cancer as well. He says his nephew is on a bloodthinner (with regards to blood clots) He says he thinks one of his sisters died of a blood clot as well, but doesn't know exactly what happened. He says she was maybe 97.   has Post-phlebitic syndrome; DVT (deep venous thrombosis) ([place]); H/O PE (pulmonary embolism); Congenital inferior vena cava interruption; Leg wound, left; IVC (inferior vena cava obstruction); Coumadin failure; Chronic anticoagulation; UTI (lower urinary tract infection); Cellulitis; Blepharitis; Cellulitis of right lower extremity; and Encounter for screening colonoscopy on his problem list.     has No Known Allergies.  Matthew Wise does not currently have medications on file.  Past Surgical History  Procedure Laterality Date  . None to date  10/07/15    Denies any headaches, dizziness, double vision, fevers, chills, night sweats, nausea, vomiting, diarrhea, constipation, chest pain, heart palpitations, shortness of breath, blood in stool, black tarry stool, urinary pain, urinary burning, urinary frequency, hematuria.  14 point review of systems was performed and is negative except as detailed under history of present illness  and above    PHYSICAL EXAMINATION  ECOG PERFORMANCE STATUS: 1 - Symptomatic but completely ambulatory  Filed Vitals:   09/18/15 1432  BP: 145/85  Pulse: 81  Temp: 98.2 F (36.8 C)  Resp: 16    GENERAL:alert, no distress, well nourished, well developed, comfortable, cooperative and smiling SKIN: skin color, texture, turgor are normal, no rashes or significant lesions HEAD: Normocephalic, No masses, lesions,  tenderness or abnormalities EYES: normal, PERRLA, EOMI, Conjunctiva are pink and non-injected EARS: External ears normal OROPHARYNX:lips, buccal mucosa, and tongue normal and mucous membranes are moist  NECK: supple, no stridor, non-tender, trachea midline LYMPH:  no palpable lymphadenopathy BREAST:not examined LUNGS: clear to auscultation  HEART: regular rate & rhythm, no murmurs and no gallops ABDOMEN:abdomen soft and normal bowel sounds BACK: Back symmetric, no curvature. EXTREMITIES:less then 2 second capillary refill, no joint deformities, effusion, or inflammation, Lower extremities wrapped with healing leg wounds. Exposed areas reveal chronic LE skin changes NEURO: alert & oriented x 3 with fluent speech, no focal motor/sensory deficits, gait normal   LABORATORY DATA: I have reviewed the data as listed  CBC Results for Matthew Wise, Matthew Wise (MRN [id number])   Ref. Range 09/18/2015 15:00  Ferritin Latest Ref Range: 24-336 ng/mL 140  WBC Latest Ref Range: 4.0-10.5 K/uL 4.9  RBC Latest Ref Range: 4.22-5.81 MIL/uL 5.02  Hemoglobin Latest Ref Range: 13.0-17.0 g/dL 15.7  HCT Latest Ref Range: 39.0-52.0 % 44.7  MCV Latest Ref Range: 78.0-100.0 fL 89.0  MCH Latest Ref Range: 26.0-34.0 pg 31.3  MCHC Latest Ref Range: 30.0-36.0 g/dL 35.1  RDW Latest Ref Range: 11.5-15.5 % 13.8  Platelets Latest Ref Range: 150-400 K/uL 162  Neutrophils Latest Units: % 49  Lymphocytes Latest Units: % 39  Monocytes Relative Latest Units: % 11  Eosinophil Latest Units: % 1  Basophil Latest Units: % 0  NEUT# Latest Ref Range: 1.7-7.7 K/uL 2.3  Lymphocyte # Latest Ref Range: 0.7-4.0 K/uL 1.9  Monocyte # Latest Ref Range: 0.1-1.0 K/uL 0.6  Eosinophils Absolute Latest Ref Range: 0.0-0.7 K/uL 0.1  Basophils Absolute Latest Ref Range: 0.0-0.1 K/uL 0.0   .   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 He denies needing any other refills today. I will refer him for a colonoscopy. He states that he does not have a preference in terms of doctors.  We will have to manage his bloodthinner Xarelto while we arrange his colonoscopy.  We will also investigate finding him a new PCP.  He knows to let us know if he needs anything else. RTC 4 months. After he is established with a new PCP we can consider moving his visits out to once yearly.   All questions were answered. The patient knows to call the clinic with any problems, questions or concerns. We can certainly see the patient much sooner if necessary.  This document serves as a record of services personally performed by Ancil Linsey, MD. It was created on her behalf by Toni Amend, a trained medical scribe. The creation of this record is based on the scribe's personal observations and the provider's statements to them. This document has been checked  and approved by the attending provider.  I have reviewed the above documentation for accuracy and completeness, and I agree with the above.  This note is electronically signed by: Molli Hazard 10/25/2015 10:05 AM

## 2015-09-19 ENCOUNTER — Encounter: Payer: Self-pay | Admitting: Gastroenterology

## 2015-10-07 ENCOUNTER — Ambulatory Visit (INDEPENDENT_AMBULATORY_CARE_PROVIDER_SITE_OTHER): Payer: Medicare Other | Admitting: Nurse Practitioner

## 2015-10-07 ENCOUNTER — Telehealth: Payer: Self-pay

## 2015-10-07 ENCOUNTER — Encounter: Payer: Self-pay | Admitting: Nurse Practitioner

## 2015-10-07 VITALS — BP 149/93 | HR 69 | Temp 97.4°F | Ht 72.0 in | Wt 291.0 lb

## 2015-10-07 DIAGNOSIS — Z1211 Encounter for screening for malignant neoplasm of colon: Secondary | ICD-10-CM | POA: Diagnosis not present

## 2015-10-07 DIAGNOSIS — Z7901 Long term (current) use of anticoagulants: Secondary | ICD-10-CM | POA: Diagnosis not present

## 2015-10-07 HISTORY — PX: OTHER SURGICAL HISTORY: SHX169

## 2015-10-07 NOTE — Assessment & Plan Note (Signed)
Patient due for first-ever screening colonoscopy. He is on chronic anticoagulation for hypercoagulable state. He developed DVT even on Coumadin and at the end of last year was switched to Xarelto. His currently seen by Dr. Whitney Muse at hematology/oncology for this. We will contact Dr. Whitney Muse for the okay to hold Xarelto for 48 hours prior to procedure and any further guidance she may have. Proceed with initial screening colonoscopy when the plan for his coagulation set.  Proceed with colonoscopy with Dr. Oneida Alar in the OR with propofol in the near future. The risks, benefits, and alternatives have been discussed in detail with the patient. They state understanding and desire to proceed.   The patient is not on any antidepressants or anxiolytics. Was previously taking oxycodone for pain. Admits to 3 drinks a day. He is also younger age 54. These reasons we'll plan for the procedure and the OR with propofol to ensure adequate sedation.

## 2015-10-07 NOTE — Progress Notes (Signed)
CC'D TO PCP °

## 2015-10-07 NOTE — Telephone Encounter (Signed)
Dr. Whitney Muse,   This mutual patient was seen in our office today by Walden Field, NP. He would like to schedule a colonoscopy for the pt in the very near future.  Please advise if it is OK to hold the Xarelto 48 hours prior to the procedure.   Thanks!   Doris

## 2015-10-07 NOTE — Assessment & Plan Note (Signed)
Asian on chronic anticoagulation for hypercoagulable state, currently Xarelto. Is followed by hematology/oncology. We typically hold Xarelto for 48 hours prior to procedure and consider restarting after procedure depending on any polypectomies or biopsies. We will contact Dr. Whitney Muse and her team for guidance on holding Xarelto prior to procedure.

## 2015-10-07 NOTE — Patient Instructions (Signed)
1. We will contact Dr. Whitney Muse in order to discuss how to best handle your Xarelto prior to your procedure. 2. We get permission from Dr. Whitney Muse and have a plan so we will call to schedule your colonoscopy over the phone.  3. Further recommendations to be based on results of your colonoscopy.

## 2015-10-07 NOTE — Progress Notes (Signed)
Primary Care Physician:  [name], MD Primary Gastroenterologist:  Dr. Oneida Alar  Chief Complaint  Patient presents with  . set up TCS    HPI:   54 year old male presents on referral from Dr. Whitney Muse for screening colonoscopy. Patient was referred for office visit rather than from triage because he is on a blood thinner. 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  He was on warfarin for number of years until 10/05/2014 when he was shown to have right lower extremity DVT and at that point he was switched to Xarelto.  Today he states he has never had a colonoscopy before. Denies abdominal pain, N/V, hematochezia, melena, fever, chills, unintentional weight loss. Denies excessive noted bleeding on Xarelto. Has a bowel movement about 1-2 times a day and are consistent with [facility] 7 without required straining. Denies chest pain, dyspnea, dizziness, lightheadedness, syncope, near syncope. Denies any other upper or lower GI symptoms.   Past Medical History  Diagnosis Date  . DVT of leg (deep venous thrombosis) (HCC)     rt. leg  . Inferior vena caval thrombosis (HCC)     Chronic  . DVT (deep venous thrombosis) ([place]) 06/15/2011  . H/O PE (pulmonary embolism) 06/15/2011  . 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 Chronic anticoagulation 11/14/2014    No past surgical history on file.  Current Outpatient Prescriptions  Medication Sig Dispense Refill  . naproxen (NAPROSYN) 500 MG tablet Take 1 tablet (500 mg total) by mouth 2 (two) times daily as needed for moderate pain. 45 tablet 1  . rivaroxaban (XARELTO) 20 MG TABS tablet Take 1 tablet (20 mg total) by mouth daily with supper. 30 tablet 5  . oxycodone (OXY-IR) 5 MG capsule Take 1 capsule (5 mg total)  by mouth every 4 (four) hours as needed for pain. (Patient not taking: Reported on 10/07/2015) 45 capsule 0   No current facility-administered medications for this visit.    Allergies as of 10/07/2015  . (No Known Allergies)    Family History  Problem Relation Age of Onset  . COPD Father   . Stroke Brother   . Cancer Brother     Social History   Social History  . Marital Status: Divorced    Spouse Name: N/A  . Number of Children: N/A  . Years of Education: N/A   Occupational History  . Not on file.   Social History Main Topics  . Smoking status: Current Every Day Smoker -- 1.00 packs/day for 30 years    Types: Cigarettes  . Smokeless tobacco: Never Used  . Alcohol Use: 3.0 oz/week    5 Cans of beer per week  . Drug Use: No     Comment: REMOTE HISTORY, Quit in 2007  . Sexual Activity: Not on file   Other Topics Concern  . Not on file   Social History Narrative    Review of Systems: 10-point ROS negative except as per HPI.    Physical Exam: BP 149/93 mmHg  Pulse 69  Temp(Src) 97.4 F (36.3 C)  Ht 6' (1.829 m)  Wt 291 lb (131.997 kg)  BMI 39.46 kg/m2 General:   Alert and oriented. Morbidly obese male. Pleasant and cooperative. Well-nourished and well-developed.  Head:  Normocephalic and atraumatic. Eyes:  Without icterus, sclera clear  and conjunctiva pink.  Ears:  Normal auditory acuity. Cardiovascular:  S1, S2 present without murmurs appreciated. Extremities without clubbing or edema. Respiratory:  Clear to auscultation bilaterally. No wheezes, rales, or rhonchi. No distress.  Gastrointestinal:  +BS, rounded/obese but soft, non-tender and non-distended. No HSM noted. No guarding or rebound. No masses appreciated.  Rectal:  Deferred  Neurologic:  Alert and oriented x4;  grossly normal neurologically. Psych:  Alert and cooperative. Normal mood and affect. Heme/Lymph/Immune: No excessive bruising noted.    10/07/2015 9:02 AM

## 2015-10-09 NOTE — Telephone Encounter (Signed)
Noted  

## 2015-10-09 NOTE — Telephone Encounter (Signed)
Routing to [employer], [place] per [place] at the cancer center.

## 2015-10-09 NOTE — Telephone Encounter (Signed)
Yes, holding Xarelto is ok.  KEFALAS,THOMAS, PA-C 10/09/2015 3:30 PM

## 2015-10-09 NOTE — Telephone Encounter (Signed)
Routing to Walden Field, NP and [place].

## 2015-10-10 ENCOUNTER — Other Ambulatory Visit: Payer: Self-pay

## 2015-10-10 MED ORDER — NA SULFATE-K SULFATE-MG SULF 17.5-3.13-1.6 GM/177ML PO SOLN: 1.0000 | ORAL | Status: DC

## 2015-10-10 NOTE — Telephone Encounter (Signed)
PT IS SET UP FOR 10/29/15 @ 930. INSTRUCTIONS ARE IN THE MAIL AND HE IS AWARE

## 2015-10-11 NOTE — Telephone Encounter (Signed)
Noted  

## 2015-10-11 NOTE — Progress Notes (Signed)
Per Gershon Mussel, [place] in the cancer center, ok to hold Xarelto for 48 hours prior to procedure (see telephone note for details). Patient has been scheduled.

## 2015-10-22 NOTE — Patient Instructions (Signed)
Matthew Wise  10/22/2015     @PREFPERIOPPHARMACY @   Your procedure is scheduled on  10/29/2015  Report to [facility] at  800  A.M.  Call this number if you have problems the morning of surgery:  [phone]   Remember:  Do not eat food or drink liquids after midnight.  Take these medicines the morning of surgery with A SIP OF WATER none   Do not wear jewelry, make-up or nail polish.  Do not wear lotions, powders, or perfumes.  You may wear deodorant.  Do not shave 48 hours prior to surgery.  Men may shave face and neck.  Do not bring valuables to the hospital.  [facility] is not responsible for any belongings or valuables.  Contacts, dentures or bridgework may not be worn into surgery.  Leave your suitcase in the car.  After surgery it may be brought to your room.  For patients admitted to the hospital, discharge time will be determined by your treatment team.  Patients discharged the day of surgery will not be allowed to drive home.   Name and phone number of your driver:   family Special instructions:  Follow the diet and prep instructions given to you by Dr Nona Dell office.  Please read over the following fact sheets that you were given. Pain Booklet, Coughing and Deep Breathing, Surgical Site Infection Prevention, Anesthesia Post-op Instructions and Care and Recovery After Surgery      Colonoscopy A colonoscopy is an exam to look at the entire large intestine (colon). This exam can help find problems such as tumors, polyps, inflammation, and areas of bleeding. The exam takes about 1 hour.  LET [facility] CARE PROVIDER KNOW ABOUT:   Any allergies you have.  All medicines you are taking, including vitamins, herbs, eye drops, creams, and over-the-counter medicines.  Previous problems you or members of your family have had with the use of anesthetics.  Any blood disorders you have.  Previous surgeries you have had.  Medical conditions you have. RISKS  AND COMPLICATIONS  Generally, this is a safe procedure. However, as with any procedure, complications can occur. Possible complications include:  Bleeding.  Tearing or rupture of the colon wall.  Reaction to medicines given during the exam.  Infection (rare). BEFORE THE PROCEDURE   Ask your health care provider about changing or stopping your regular medicines.  You may be prescribed an oral bowel prep. This involves drinking a large amount of medicated liquid, starting the day before your procedure. The liquid will cause you to have multiple loose stools until your stool is almost clear or light green. This cleans out your colon in preparation for the procedure.  Do not eat or drink anything else once you have started the bowel prep, unless your health care provider tells you it is safe to do so.  Arrange for someone to drive you home after the procedure. PROCEDURE   You will be given medicine to help you relax (sedative).  You will lie on your side with your knees bent.  A long, flexible tube with a light and camera on the end (colonoscope) will be inserted through the rectum and into the colon. The camera sends video back to a computer screen as it moves through the colon. The colonoscope also releases carbon dioxide gas to inflate the colon. This helps your health care provider see the area better.  During the exam, your health care provider may take a  small tissue sample (biopsy) to be examined under a microscope if any abnormalities are found.  The exam is finished when the entire colon has been viewed. AFTER THE PROCEDURE   Do not drive for 24 hours after the exam.  You may have a small amount of blood in your stool.  You may pass moderate amounts of gas and have mild abdominal cramping or bloating. This is caused by the gas used to inflate your colon during the exam.  Ask when your test results will be ready and how you will get your results. Make sure you get your test  results.   This information is not intended to replace advice given to you by your health care provider. Make sure you discuss any questions you have with your health care provider.   Document Released: 11/13/2000 Document Revised: 09/06/2013 Document Reviewed: 07/24/2013 Elsevier Interactive Patient Education 2016 [employer] PATIENT INSTRUCTIONS POST-ANESTHESIA  IMMEDIATELY FOLLOWING SURGERY:  Do not drive or operate machinery for the first twenty four hours after surgery.  Do not make any important decisions for twenty four hours after surgery or while taking narcotic pain medications or sedatives.  If you develop intractable nausea and vomiting or a severe headache please notify your doctor immediately.  FOLLOW-UP:  Please make an appointment with your surgeon as instructed. You do not need to follow up with anesthesia unless specifically instructed to do so.  WOUND CARE INSTRUCTIONS (if applicable):  Keep a dry clean dressing on the anesthesia/puncture wound site if there is drainage.  Once the wound has quit draining you may leave it open to air.  Generally you should leave the bandage intact for twenty four hours unless there is drainage.  If the epidural site drains for more than 36-48 hours please call the anesthesia department.  QUESTIONS?:  Please feel free to call your physician or the hospital operator if you have any questions, and they will be happy to assist you.

## 2015-10-23 ENCOUNTER — Encounter (HOSPITAL_COMMUNITY): Payer: Self-pay

## 2015-10-23 ENCOUNTER — Other Ambulatory Visit: Payer: Self-pay

## 2015-10-23 ENCOUNTER — Encounter (HOSPITAL_COMMUNITY)
Admission: RE | Admit: 2015-10-23 | Discharge: 2015-10-23 | Disposition: A | Discharge status: Home / Self Care | Payer: Medicare Other | Source: Ambulatory Visit | Attending: Gastroenterology | Admitting: Gastroenterology

## 2015-10-23 DIAGNOSIS — Z01818 Encounter for other preprocedural examination: Secondary | ICD-10-CM | POA: Insufficient documentation

## 2015-10-23 LAB — BASIC METABOLIC PANEL
ANION GAP: 6 (ref 5–15)
BUN: 12 mg/dL (ref 6–20)
CALCIUM: 9.4 mg/dL (ref 8.9–10.3)
CO2: 29 mmol/L (ref 22–32)
Chloride: 102 mmol/L (ref 101–111)
Creatinine, Ser: 1.19 mg/dL (ref 0.61–1.24)
Glucose, Bld: 124 mg/dL — ABNORMAL HIGH (ref 65–99)
POTASSIUM: 4 mmol/L (ref 3.5–5.1)
Sodium: 137 mmol/L (ref 135–145)

## 2015-10-23 LAB — CBC
HEMATOCRIT: 46.4 % (ref 39.0–52.0)
Hemoglobin: 16.4 g/dL (ref 13.0–17.0)
MCH: 31.7 pg (ref 26.0–34.0)
MCHC: 35.3 g/dL (ref 30.0–36.0)
MCV: 89.6 fL (ref 78.0–100.0)
PLATELETS: 189 10*3/uL (ref 150–400)
RBC: 5.18 MIL/uL (ref 4.22–5.81)
RDW: 14 % (ref 11.5–15.5)
WBC: 4.7 10*3/uL (ref 4.0–10.5)

## 2015-10-29 ENCOUNTER — Ambulatory Visit (HOSPITAL_COMMUNITY)
Admission: RE | Admit: 2015-10-29 | Discharge: 2015-10-29 | Disposition: A | Discharge status: Home / Self Care | Payer: Medicare Other | Source: Ambulatory Visit | Attending: Gastroenterology | Admitting: Gastroenterology

## 2015-10-29 ENCOUNTER — Ambulatory Visit (HOSPITAL_COMMUNITY): Payer: Medicare Other | Admitting: Anesthesiology

## 2015-10-29 ENCOUNTER — Encounter (HOSPITAL_COMMUNITY): Payer: Self-pay | Admitting: Anesthesiology

## 2015-10-29 ENCOUNTER — Other Ambulatory Visit: Payer: Self-pay

## 2015-10-29 ENCOUNTER — Encounter (HOSPITAL_COMMUNITY)
Admission: RE | Disposition: A | Discharge status: Home / Self Care | Payer: Self-pay | Source: Ambulatory Visit | Attending: Gastroenterology

## 2015-10-29 DIAGNOSIS — Z86711 Personal history of pulmonary embolism: Secondary | ICD-10-CM | POA: Insufficient documentation

## 2015-10-29 DIAGNOSIS — Z79899 Other long term (current) drug therapy: Secondary | ICD-10-CM | POA: Diagnosis not present

## 2015-10-29 DIAGNOSIS — D123 Benign neoplasm of transverse colon: Secondary | ICD-10-CM | POA: Insufficient documentation

## 2015-10-29 DIAGNOSIS — Q438 Other specified congenital malformations of intestine: Secondary | ICD-10-CM | POA: Insufficient documentation

## 2015-10-29 DIAGNOSIS — Z1211 Encounter for screening for malignant neoplasm of colon: Secondary | ICD-10-CM

## 2015-10-29 DIAGNOSIS — D122 Benign neoplasm of ascending colon: Secondary | ICD-10-CM | POA: Diagnosis not present

## 2015-10-29 DIAGNOSIS — Z86718 Personal history of other venous thrombosis and embolism: Secondary | ICD-10-CM | POA: Diagnosis not present

## 2015-10-29 DIAGNOSIS — F17210 Nicotine dependence, cigarettes, uncomplicated: Secondary | ICD-10-CM | POA: Insufficient documentation

## 2015-10-29 DIAGNOSIS — Z7901 Long term (current) use of anticoagulants: Secondary | ICD-10-CM | POA: Insufficient documentation

## 2015-10-29 DIAGNOSIS — K648 Other hemorrhoids: Secondary | ICD-10-CM | POA: Insufficient documentation

## 2015-10-29 HISTORY — PX: COLONOSCOPY WITH PROPOFOL: SHX5780

## 2015-10-29 SURGERY — COLONOSCOPY WITH PROPOFOL
Anesthesia: Monitor Anesthesia Care

## 2015-10-29 MED ORDER — EPHEDRINE SULFATE 50 MG/ML IJ SOLN
INTRAMUSCULAR | Status: DC | PRN
  Administered 2015-10-29: 10 mg via INTRAVENOUS

## 2015-10-29 MED ORDER — LIDOCAINE HCL (PF) 1 % IJ SOLN
INTRAMUSCULAR | Status: AC
  Filled 2015-10-29: qty 5

## 2015-10-29 MED ORDER — LIDOCAINE HCL (CARDIAC) 10 MG/ML IV SOLN
INTRAVENOUS | Status: DC | PRN
  Administered 2015-10-29: 50 mg via INTRAVENOUS

## 2015-10-29 MED ORDER — MIDAZOLAM HCL 5 MG/5ML IJ SOLN
INTRAMUSCULAR | Status: DC | PRN
  Administered 2015-10-29: 2 mg via INTRAVENOUS

## 2015-10-29 MED ORDER — PROPOFOL 10 MG/ML IV BOLUS
INTRAVENOUS | Status: AC
  Filled 2015-10-29: qty 20

## 2015-10-29 MED ORDER — FENTANYL CITRATE (PF) 100 MCG/2ML IJ SOLN: 25.0000 ug | INTRAMUSCULAR | Status: DC | PRN

## 2015-10-29 MED ORDER — ONDANSETRON HCL 4 MG/2ML IJ SOLN
INTRAMUSCULAR | Status: AC
  Filled 2015-10-29: qty 2

## 2015-10-29 MED ORDER — MIDAZOLAM HCL 2 MG/2ML IJ SOLN
INTRAMUSCULAR | Status: AC
  Filled 2015-10-29: qty 2

## 2015-10-29 MED ORDER — GLYCOPYRROLATE 0.2 MG/ML IJ SOLN
INTRAMUSCULAR | Status: AC
  Filled 2015-10-29: qty 1

## 2015-10-29 MED ORDER — FENTANYL CITRATE (PF) 100 MCG/2ML IJ SOLN
25.0000 ug | INTRAMUSCULAR | Status: AC
  Administered 2015-10-29 (×2): 25 ug via INTRAVENOUS

## 2015-10-29 MED ORDER — ONDANSETRON HCL 4 MG/2ML IJ SOLN
4.0000 mg | Freq: Once | INTRAMUSCULAR | Status: AC
  Administered 2015-10-29: 4 mg via INTRAVENOUS

## 2015-10-29 MED ORDER — LACTATED RINGERS IV SOLN
INTRAVENOUS | Status: DC
  Administered 2015-10-29: 09:00:00 via INTRAVENOUS

## 2015-10-29 MED ORDER — MIDAZOLAM HCL 2 MG/2ML IJ SOLN
1.0000 mg | INTRAMUSCULAR | Status: DC | PRN
  Administered 2015-10-29: 2 mg via INTRAVENOUS

## 2015-10-29 MED ORDER — FENTANYL CITRATE (PF) 100 MCG/2ML IJ SOLN
INTRAMUSCULAR | Status: AC
  Filled 2015-10-29: qty 2

## 2015-10-29 MED ORDER — ONDANSETRON HCL 4 MG/2ML IJ SOLN: 4.0000 mg | Freq: Once | INTRAMUSCULAR | Status: DC | PRN

## 2015-10-29 MED ORDER — PROPOFOL 10 MG/ML IV BOLUS
INTRAVENOUS | Status: AC
  Filled 2015-10-29: qty 40

## 2015-10-29 MED ORDER — GLYCOPYRROLATE 0.2 MG/ML IJ SOLN
0.2000 mg | Freq: Once | INTRAMUSCULAR | Status: AC
  Administered 2015-10-29: 0.2 mg via INTRAVENOUS

## 2015-10-29 MED ORDER — PROPOFOL 500 MG/50ML IV EMUL
INTRAVENOUS | Status: DC | PRN
  Administered 2015-10-29: 10:00:00 via INTRAVENOUS
  Administered 2015-10-29: 100 ug/kg/min via INTRAVENOUS
  Administered 2015-10-29: 10:00:00 via INTRAVENOUS

## 2015-10-29 NOTE — Anesthesia Preprocedure Evaluation (Signed)
Anesthesia Evaluation  Patient identified by MRN, date of birth, ID band Patient awake    Reviewed: Allergy & Precautions, NPO status , Patient's Chart, lab work & pertinent test results  Airway Mallampati: Trach       Dental  (+) Teeth Intact   Pulmonary Current Smoker, PE   breath sounds clear to auscultation       Cardiovascular + Peripheral Vascular Disease and + DVT   Rhythm:Regular Rate:Normal     Neuro/Psych    GI/Hepatic   Endo/Other    Renal/GU      Musculoskeletal   Abdominal   Peds  Hematology   Anesthesia Other Findings   Reproductive/Obstetrics                             Anesthesia Physical Anesthesia Plan  ASA: III  Anesthesia Plan: MAC   Post-op Pain Management:    Induction: Intravenous  Airway Management Planned: Simple Face Mask  Additional Equipment:   Intra-op Plan:   Post-operative Plan:   Informed Consent: I have reviewed the patients History and Physical, chart, labs and discussed the procedure including the risks, benefits and alternatives for the proposed anesthesia with the patient or authorized representative who has indicated his/her understanding and acceptance.     Plan Discussed with:   Anesthesia Plan Comments:         Anesthesia Quick Evaluation

## 2015-10-29 NOTE — H&P (Signed)
  Primary Care Physician:  [name], MD Primary Gastroenterologist:  Dr. Oneida Alar  Pre-Procedure History & Physical: HPI:  Matthew Wise is a 54 y.o. male here for COLON CANCER SCREENING.  Past Medical History  Diagnosis Date  . DVT of leg (deep venous thrombosis) (HCC)     rt. leg  . Inferior vena caval thrombosis (HCC)     Chronic  . DVT (deep venous thrombosis) ([facility]) 06/15/2011  . H/O PE (pulmonary embolism) 06/15/2011  . 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 Chronic anticoagulation 11/14/2014    Past Surgical History  Procedure Laterality Date  . None to date  10/07/15    Prior to Admission medications   Medication Sig Start Date End Date Taking? Authorizing Provider  Na Sulfate-K Sulfate-Mg Sulf (SUPREP BOWEL PREP) SOLN Take 1 kit by mouth as directed. 10/10/15   Danie Binder, MD  naproxen (NAPROSYN) 500 MG tablet Take 1 tablet (500 mg total) by mouth 2 (two) times daily as needed for moderate pain. 05/22/15   Baird Cancer, PA-C  rivaroxaban (XARELTO) 20 MG TABS tablet Take 1 tablet (20 mg total) by mouth daily with supper. 05/22/15   Baird Cancer, PA-C    Allergies as of 10/10/2015  . (No Known Allergies)    Family History  Problem Relation Age of Onset  . COPD Father   . Stroke Brother   . Throat cancer Brother   . Colon cancer Neg Hx     Social History   Social History  . Marital Status: Divorced    Spouse Name: N/A  . Number of Children: N/A  . Years of Education: N/A   Occupational History  . Not on file.   Social History Main Topics  . Smoking status: Current Every Day Smoker -- 1.00 packs/day for 30 years    Types: Cigarettes  . Smokeless tobacco: Never Used  . Alcohol Use: 3.0 oz/week    5 Cans of beer per week     Comment: 2-3 beers a day  . Drug Use: No     Comment: REMOTE HISTORY, Quit in 2007  . Sexual Activity: Not on file   Other  Topics Concern  . Not on file   Social History Narrative    Review of Systems: See HPI, otherwise negative ROS   Physical Exam: There were no vitals taken for this visit. General:   Alert,  pleasant and cooperative in NAD Head:  Normocephalic and atraumatic. Neck:  Supple; Lungs:  Clear throughout to auscultation.    Heart:  Regular rate and rhythm. Abdomen:  Soft, nontender and nondistended. Normal bowel sounds, without guarding, and without rebound.   Neurologic:  Alert and  oriented x4;  grossly normal neurologically.  Impression/Plan:   SCREENING  Plan:  1. TCS TODAY

## 2015-10-29 NOTE — Anesthesia Postprocedure Evaluation (Signed)
Anesthesia Post Note  Patient: Matthew Wise  Procedure(s) Performed: Procedure(s) (LRB): COLONOSCOPY WITH PROPOFOL (N/A)  Patient location during evaluation: PACU Anesthesia Type: MAC Level of consciousness: awake and alert and oriented Pain management: pain level controlled Vital Signs Assessment: post-procedure vital signs reviewed and stable Respiratory status: spontaneous breathing, respiratory function stable and patient connected to face mask oxygen Cardiovascular status: stable Anesthetic complications: no    Last Vitals:  Filed Vitals:   10/29/15 0850 10/29/15 0855  BP: 129/81 136/74  Temp:    Resp: 12 12    Last Pain: There were no vitals filed for this visit.               ADAMS, AMY A

## 2015-10-29 NOTE — Op Note (Signed)
[facility] [address] [place], [address]   COLONOSCOPY PROCEDURE REPORT  PATIENT: Matthew Wise, Matthew Wise  MR#: [id number] BIRTHDATE: August 15, 1961 , 54  yrs. old GENDER: male ENDOSCOPIST: Danie Binder, MD REFERRED [id number] Kefalas, P.A.  Kerin Perna, M.D. PROCEDURE DATE:  November 07, 2015 PROCEDURE:   Colonoscopy with snare polypectomy  AND THE ENTRADA OVERTUBE INDICATIONS:average risk patient for colon cancer. MEDICATIONS: Monitored anesthesia care  DESCRIPTION OF PROCEDURE:    Physical exam was performed.  Informed consent was obtained from the patient after explaining the benefits, risks, and alternatives to procedure.  The patient was connected to monitor and placed in left lateral position. Continuous oxygen was provided by nasal cannula and IV medicine administered through an indwelling cannula.  After administration of sedation and rectal exam, the patients rectum was intubated and the     colonoscope was advanced under direct visualization to the cecum.  The scope was removed slowly by carefully examining the color, texture, anatomy, and integrity mucosa on the way out. colonoscope was advanced under direct visualization to the cecum. UNABLE TO COMPLETE EVALUATION OF THE CECUM DUE TO LOOPING OF THE SCOPE. PT MOVED TO BACK/SIDE AND PRESSURE APPLIED BUT SCOPE COULD NOT BE ADVANCED INTO CECUM TO COMPLETE VISUALIZATION. THE OVERTUBE WAS PLACED ON THE colonoscope  AND THE SCOPE was advanced under direct visualization to the cecum.  The scope was removed slowly by carefully examining the color, texture, anatomy, and integrity mucosa on the way out.  The patient was recovered in endoscopy and discharged home in satisfactory condition. Estimated blood loss is zero unless otherwise noted in this procedure report.    COLON FINDINGS: The colon was redundant.  Manual abdominal counter-pressure was used to reach the cecum.  The patient was moved on to their back to reach  the cecum, Two sessile polyps ranging from 6 to 2mm in size were found at the hepatic flexure and in the ascending colon.  A polypectomy was performed using snare cautery.  , The examination was otherwise normal.  , and Moderate sized internal hemorrhoids were found.  PREP QUALITY: good.  CECAL W/D TIME: 52       minutes COMPLICATIONS: None  ENDOSCOPIC IMPRESSION: 1.   The LEFT colon IS EXTREMELY redundant 2.   Two COLON polyps REMOVED 3.   Moderate sized internal hemorrhoids  RECOMMENDATIONS: RE-START XARELTO ON NOV 30. FOLLOW A HIGH FIBER DIET.  Marland Kitchen AWAIT BIOPSY RESULTS. Next colonoscopy in 6 MOS AT [facility] WITH AN OVERTUBE/FLUOROSCOPY/PROPOFOL. ALL FUTURE SCREENING TCS SHOULD BE PERFORMED UNDER FLUORO WITH AN OVERTUBE. eSigned:  Danie Binder, MD 07-Nov-2015 11:06 AM CPT CODES: ICD CODES:  The ICD and CPT codes recommended by this software are interpretations from the data that the clinical staff has captured with the software.  The verification of the translation of this report to the ICD and CPT codes and modifiers is the sole responsibility of the health care institution and practicing physician where this report was generated.  [employer] will not be held responsible for the validity of the ICD and CPT codes included on this report.  AMA assumes no liability for data contained or not contained herein. CPT is a Designer, television/film set of the [employer].

## 2015-10-29 NOTE — Transfer of Care (Signed)
Immediate Anesthesia Transfer of Care Note  Patient: Matthew Wise  Procedure(s) Performed: Procedure(s) with comments: COLONOSCOPY WITH PROPOFOL (N/A) - 930  Patient Location: PACU  Anesthesia Type:MAC  Level of Consciousness: awake, alert , oriented and patient cooperative  Airway & Oxygen Therapy: Patient Spontanous Breathing and Patient connected to face mask oxygen  Post-op Assessment: Report given to RN and Post -op Vital signs reviewed and stable  Post vital signs: Reviewed and stable  Last Vitals:  Filed Vitals:   10/29/15 0850 10/29/15 0855  BP: 129/81 136/74  Temp:    Resp: 12 12    Complications: No apparent anesthesia complications

## 2015-10-29 NOTE — Progress Notes (Signed)
REVIEWED-NO ADDITIONAL RECOMMENDATIONS. 

## 2015-10-29 NOTE — Discharge Instructions (Signed)
You had 2 polyps removed. You have internal hemorrhoids. YOU HAVE A REDUNDANT LEFT COLON. I WAS ABLE TO EVALUATE 99% BUT COULD NOT EVALUATE THE CECUM.   I SPOKE WITH TOM KEFALAS. RE-START YOUR XARELTO ON NOV 30.  FOLLOW A HIGH FIBER DIET. AVOID ITEMS THAT CAUSE BLOATING & GAS. SEE INFO BELOW.  YOUR BIOPSY RESULTS WILL BE AVAILABLE IN MY CHART  DEC 1 AND MY OFFICE WILL CONTACT YOU IN 10-14 DAYS WITH YOUR RESULTS.   Next colonoscopy in 6 MOS AT [facility] WITH AN OVERTUBE/FLUOROSCOPY/PROPOFOL.   Colonoscopy Care After Read the instructions outlined below and refer to this sheet in the next week. These discharge instructions provide you with general information on caring for yourself after you leave the hospital. While your treatment has been planned according to the most current medical practices available, unavoidable complications occasionally occur. If you have any problems or questions after discharge, call DR. Eriel Doyon, [phone].  ACTIVITY  You may resume your regular activity, but move at a slower pace for the next 24 hours.   Take frequent rest periods for the next 24 hours.   Walking will help get rid of the air and reduce the bloated feeling in your belly (abdomen).   No driving for 24 hours (because of the medicine (anesthesia) used during the test).   You may shower.   Do not sign any important legal documents or operate any machinery for 24 hours (because of the anesthesia used during the test).    NUTRITION  Drink plenty of fluids.   You may resume your normal diet as instructed by your doctor.   Begin with a light meal and progress to your normal diet. Heavy or fried foods are harder to digest and may make you feel sick to your stomach (nauseated).   Avoid alcoholic beverages for 24 hours or as instructed.    MEDICATIONS  You may resume your normal medications.   WHAT YOU CAN EXPECT TODAY  Some feelings of bloating in the abdomen.   Passage of more gas  than usual.   Spotting of blood in your stool or on the toilet paper  .  IF YOU HAD POLYPS REMOVED DURING THE COLONOSCOPY:  Eat a soft diet IF YOU HAVE NAUSEA, BLOATING, ABDOMINAL PAIN, OR VOMITING.    FINDING OUT THE RESULTS OF YOUR TEST Not all test results are available during your visit. DR. Oneida Alar WILL CALL YOU WITHIN 14 DAYS OF YOUR PROCEDUE WITH YOUR RESULTS. Do not assume everything is normal if you have not heard from DR. Dell Hurtubise, CALL HER OFFICE AT [phone].  SEEK IMMEDIATE MEDICAL ATTENTION AND CALL THE OFFICE: [phone] IF:  You have more than a spotting of blood in your stool.   Your belly is swollen (abdominal distention).   You are nauseated or vomiting.   You have a temperature over 101F.   You have abdominal pain or discomfort that is severe or gets worse throughout the day.   High-Fiber Diet A high-fiber diet changes your normal diet to include more whole grains, legumes, fruits, and vegetables. Changes in the diet involve replacing refined carbohydrates with unrefined foods. The calorie level of the diet is essentially unchanged. The Dietary Reference Intake (recommended amount) for adult males is 38 grams per day. For adult females, it is 25 grams per day. Pregnant and lactating women should consume 28 grams of fiber per day. Fiber is the intact part of a plant that is not broken down during digestion. Functional fiber is  fiber that has been isolated from the plant to provide a beneficial effect in the body. PURPOSE  Increase stool bulk.   Ease and regulate bowel movements.   Lower cholesterol.  REDUCE RISK OF COLON CANCER  INDICATIONS THAT YOU NEED MORE FIBER  Constipation and hemorrhoids.   Uncomplicated diverticulosis (intestine condition) and irritable bowel syndrome.   Weight management.   As a protective measure against hardening of the arteries (atherosclerosis), diabetes, and cancer.   GUIDELINES FOR INCREASING FIBER IN THE  DIET  Start adding fiber to the diet slowly. A gradual increase of about 5 more grams (2 slices of whole-wheat bread, 2 servings of most fruits or vegetables, or 1 bowl of high-fiber cereal) per day is best. Too rapid an increase in fiber may result in constipation, flatulence, and bloating.   Drink enough water and fluids to keep your urine clear or pale yellow. Water, juice, or caffeine-free drinks are recommended. Not drinking enough fluid may cause constipation.   Eat a variety of high-fiber foods rather than one type of fiber.   Try to increase your intake of fiber through using high-fiber foods rather than fiber pills or supplements that contain small amounts of fiber.   The goal is to change the types of food eaten. Do not supplement your present diet with high-fiber foods, but replace foods in your present diet.   INCLUDE A VARIETY OF FIBER SOURCES  Replace refined and processed grains with whole grains, canned fruits with fresh fruits, and incorporate other fiber sources. White rice, white breads, and most bakery goods contain little or no fiber.   Brown whole-grain rice, buckwheat oats, and many fruits and vegetables are all good sources of fiber. These include: broccoli, Brussels sprouts, cabbage, cauliflower, beets, sweet potatoes, white potatoes (skin on), carrots, tomatoes, eggplant, squash, berries, fresh fruits, and dried fruits.   Cereals appear to be the richest source of fiber. Cereal fiber is found in whole grains and bran. Bran is the fiber-rich outer coat of cereal grain, which is largely removed in refining. In whole-grain cereals, the bran remains. In breakfast cereals, the largest amount of fiber is found in those with "bran" in their names. The fiber content is sometimes indicated on the label.   You may need to include additional fruits and vegetables each day.   In baking, for 1 cup white flour, you may use the following substitutions:   1 cup whole-wheat flour  minus 2 tablespoons.   1/2 cup white flour plus 1/2 cup whole-wheat flour.   Polyps, Colon  A polyp is extra tissue that grows inside your body. Colon polyps grow in the large intestine. The large intestine, also called the colon, is part of your digestive system. It is a long, hollow tube at the end of your digestive tract where your body makes and stores stool. Most polyps are not dangerous. They are benign. This means they are not cancerous. But over time, some types of polyps can turn into cancer. Polyps that are smaller than a pea are usually not harmful. But larger polyps could someday become or may already be cancerous. To be safe, doctors remove all polyps and test them.   WHO GETS POLYPS? Anyone can get polyps, but certain people are more likely than others. You may have a greater chance of getting polyps if:  You are over 50.   You have had polyps before.   Someone in your family has had polyps.   Someone in your family has  had cancer of the large intestine.   Find out if someone in your family has had polyps. You may also be more likely to get polyps if you:   Eat a lot of fatty foods   Smoke   Drink alcohol   Do not exercise  Eat too much   PREVENTION There is not one sure way to prevent polyps. You might be able to lower your risk of getting them if you:  Eat more fruits and vegetables and less fatty food.   Do not smoke.   Avoid alcohol.   Exercise every day.   Lose weight if you are overweight.   Eating more calcium and folate can also lower your risk of getting polyps. Some foods that are rich in calcium are milk, cheese, and broccoli. Some foods that are rich in folate are chickpeas, kidney beans, and spinach.   Hemorrhoids Hemorrhoids are dilated (enlarged) veins around the rectum. Sometimes clots will form in the veins. This makes them swollen and painful. These are called thrombosed hemorrhoids. Causes of hemorrhoids include:  Constipation.    Straining to have a bowel movement.   HEAVY LIFTING  HOME CARE INSTRUCTIONS  Eat a well balanced diet and drink 6 to 8 glasses of water every day to avoid constipation. You may also use a bulk laxative.   Avoid straining to have bowel movements.   Keep anal area dry and clean.   Do not use a donut shaped pillow or sit on the toilet for long periods. This increases blood pooling and pain.   Move your bowels when your body has the urge; this will require less straining and will decrease pain and pressure.

## 2015-11-01 ENCOUNTER — Encounter (HOSPITAL_COMMUNITY): Payer: Self-pay | Admitting: Gastroenterology

## 2015-11-14 ENCOUNTER — Telehealth: Payer: Self-pay | Admitting: Gastroenterology

## 2015-11-14 NOTE — Telephone Encounter (Signed)
Please call pt. Matthew Wise had TWO simple adenomas removed.  Matthew Wise WAS SUPPOSE TO START XARELTO ON NOV 30.  FOLLOW A HIGH FIBER DIET. AVOID ITEMS THAT CAUSE BLOATING & GAS.   Next colonoscopy in 6 MOS BECAUSE YOUR TCS WAS INCOMPLETE. YOU SHOULD BE SEEN AT [facility] FOR YOUR TCS WITH AN OVERTUBE/FLUOROSCOPY/PROPOFOL.

## 2015-11-15 NOTE — Telephone Encounter (Signed)
Reminder in epic °

## 2015-11-18 NOTE — Telephone Encounter (Signed)
LMOM to call.

## 2015-11-19 NOTE — Telephone Encounter (Signed)
Referral has been made.

## 2015-11-21 NOTE — Telephone Encounter (Signed)
Pt is aware of results and that referral has been made.

## 2015-12-17 ENCOUNTER — Other Ambulatory Visit (HOSPITAL_COMMUNITY): Payer: Self-pay | Admitting: Oncology

## 2015-12-17 ENCOUNTER — Telehealth (HOSPITAL_COMMUNITY): Payer: Self-pay | Admitting: *Deleted

## 2015-12-17 NOTE — Telephone Encounter (Signed)
Escribed to his pharmacy  Robynn Pane, PA-C 12/17/2015 4:01 PM

## 2016-01-21 ENCOUNTER — Other Ambulatory Visit (HOSPITAL_COMMUNITY): Payer: Medicare Other

## 2016-01-21 ENCOUNTER — Ambulatory Visit (HOSPITAL_COMMUNITY): Payer: Medicare Other | Admitting: Hematology & Oncology

## 2016-01-21 NOTE — Patient Instructions (Signed)
[place] at [facility] Discharge Instructions  RECOMMENDATIONS MADE BY THE CONSULTANT AND ANY TEST RESULTS WILL BE SENT TO YOUR REFERRING PHYSICIAN.    Exam and discussion by Dr Whitney Muse today  Return to see the doctor in Please call the clinic if you have any questions or concerns   Thank you for choosing [place] at [facility] to provide your oncology and hematology care.  To afford each patient quality time with our provider, please arrive at least 15 minutes before your scheduled appointment time.   Beginning January 23rd 2017 lab work for the [facility] will be done in the  Main lab at [facility] on 1st floor. If you have a lab appointment with the [place] [facility] please come in thru the  Main Entrance and check in at the main information desk  You need to re-schedule your appointment should you arrive 10 or more minutes late.  We strive to give you quality time with our providers, and arriving late affects you and other patients whose appointments are after yours.  Also, if you no show three or more times for appointments you may be dismissed from the clinic at the providers discretion.     Again, thank you for choosing [facility].  Our hope is that these requests will decrease the amount of time that you wait before being seen by our physicians.       _____________________________________________________________  Should you have questions after your visit to [facility], please contact our office at [phone] between the hours of 8:30 a.m. and 4:30 p.m.  Voicemails left after 4:30 p.m. will not be returned until the following business day.  For prescription refill requests, have your pharmacy contact our office.

## 2016-01-21 NOTE — Progress Notes (Signed)
This encounter was created in error - please disregard.

## 2016-02-26 DIAGNOSIS — N3000 Acute cystitis without hematuria: Secondary | ICD-10-CM | POA: Diagnosis not present

## 2016-02-26 DIAGNOSIS — N390 Urinary tract infection, site not specified: Secondary | ICD-10-CM | POA: Diagnosis not present

## 2016-03-25 ENCOUNTER — Other Ambulatory Visit (HOSPITAL_COMMUNITY): Payer: Self-pay | Admitting: Emergency Medicine

## 2016-03-25 DIAGNOSIS — I82403 Acute embolism and thrombosis of unspecified deep veins of lower extremity, bilateral: Secondary | ICD-10-CM

## 2016-03-25 DIAGNOSIS — D508 Other iron deficiency anemias: Secondary | ICD-10-CM

## 2016-03-27 ENCOUNTER — Encounter (HOSPITAL_COMMUNITY): Payer: Self-pay | Admitting: Hematology & Oncology

## 2016-03-27 ENCOUNTER — Encounter (HOSPITAL_COMMUNITY): Payer: Medicare Other

## 2016-03-27 ENCOUNTER — Encounter (HOSPITAL_COMMUNITY): Payer: Medicare Other | Attending: Hematology & Oncology | Admitting: Hematology & Oncology

## 2016-03-27 VITALS — BP 157/89 | HR 64 | Temp 97.8°F | Resp 16 | Wt 299.8 lb

## 2016-03-27 DIAGNOSIS — I82409 Acute embolism and thrombosis of unspecified deep veins of unspecified lower extremity: Secondary | ICD-10-CM | POA: Diagnosis not present

## 2016-03-27 DIAGNOSIS — I82221 Chronic embolism and thrombosis of inferior vena cava: Secondary | ICD-10-CM | POA: Diagnosis not present

## 2016-03-27 DIAGNOSIS — D573 Sickle-cell trait: Secondary | ICD-10-CM

## 2016-03-27 DIAGNOSIS — D649 Anemia, unspecified: Secondary | ICD-10-CM | POA: Insufficient documentation

## 2016-03-27 DIAGNOSIS — I82509 Chronic embolism and thrombosis of unspecified deep veins of unspecified lower extremity: Secondary | ICD-10-CM | POA: Diagnosis not present

## 2016-03-27 DIAGNOSIS — D508 Other iron deficiency anemias: Secondary | ICD-10-CM

## 2016-03-27 DIAGNOSIS — I82401 Acute embolism and thrombosis of unspecified deep veins of right lower extremity: Secondary | ICD-10-CM

## 2016-03-27 DIAGNOSIS — I2699 Other pulmonary embolism without acute cor pulmonale: Secondary | ICD-10-CM

## 2016-03-27 DIAGNOSIS — Z7901 Long term (current) use of anticoagulants: Secondary | ICD-10-CM

## 2016-03-27 DIAGNOSIS — Z720 Tobacco use: Secondary | ICD-10-CM

## 2016-03-27 DIAGNOSIS — I82403 Acute embolism and thrombosis of unspecified deep veins of lower extremity, bilateral: Secondary | ICD-10-CM

## 2016-03-27 LAB — CBC WITH DIFFERENTIAL/PLATELET
BASOS PCT: 0 %
Basophils Absolute: 0 10*3/uL (ref 0.0–0.1)
Eosinophils Absolute: 0.1 10*3/uL (ref 0.0–0.7)
Eosinophils Relative: 1 %
HEMATOCRIT: 46.2 % (ref 39.0–52.0)
HEMOGLOBIN: 16.6 g/dL (ref 13.0–17.0)
LYMPHS ABS: 2.2 10*3/uL (ref 0.7–4.0)
Lymphocytes Relative: 47 %
MCH: 31.4 pg (ref 26.0–34.0)
MCHC: 35.9 g/dL (ref 30.0–36.0)
MCV: 87.5 fL (ref 78.0–100.0)
MONOS PCT: 11 %
Monocytes Absolute: 0.5 10*3/uL (ref 0.1–1.0)
NEUTROS ABS: 1.9 10*3/uL (ref 1.7–7.7)
NEUTROS PCT: 41 %
Platelets: 181 10*3/uL (ref 150–400)
RBC: 5.28 MIL/uL (ref 4.22–5.81)
RDW: 13.9 % (ref 11.5–15.5)
WBC: 4.6 10*3/uL (ref 4.0–10.5)

## 2016-03-27 LAB — FERRITIN: Ferritin: 121 ng/mL (ref 24–336)

## 2016-03-27 MED ORDER — OXYCODONE-ACETAMINOPHEN 5-325 MG PO TABS: 1.0000 | ORAL_TABLET | ORAL | Status: DC | PRN

## 2016-03-27 MED ORDER — RIVAROXABAN 20 MG PO TABS
ORAL_TABLET | ORAL | Status: DC
Start: 2016-03-27 — End: 2016-11-21

## 2016-03-27 NOTE — Progress Notes (Signed)
Matthew Herring., MD [place] [id number]  DVT (deep venous thrombosis), unspecified laterality - Plan: oxyCODONE-acetaminophen (PERCOCET/ROXICET) 5-325 MG tablet, rivaroxaban (XARELTO) 20 MG TABS tablet, CBC with Differential, Ferritin  Anemia, unspecified anemia type - Plan: oxyCODONE-acetaminophen (PERCOCET/ROXICET) 5-325 MG tablet, rivaroxaban (XARELTO) 20 MG TABS tablet, CBC with Differential, Ferritin  CURRENT THERAPY: Xarelto 20 mg daily due to Coumadin failure with acute DVT (likely due to noncompliance with Coumadin)  INTERVAL HISTORY: Matthew Wise 55 y.o. 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 He was then started on Xarelto loading dose and transitioned to the maintenance dose after 21 days.   Matthew Wise was here alone today.  He has had some blood in his urine for about 3 weeks. He went to urgent care for this they gave him a prescription for antibiotics. He has finished these antibiotics and they did not help so he is going to go see his Urologist next month in [place]. He denies current hematuria. He notes that he plans on "absolutely" going to his urology appointment.   He has another wound on his left leg. He noticed this about a week ago and does not remember hitting it or causing it in anyway. He said that the wound is draining and is superficial. He is going to go back to a wound care clinic in [place]. He last went there a year ago. He would also like a prescription for stockings.  He would like to be given something for his  pain in his left leg. He said that it was very bad last week. He has had no pain in his back.   He needs a refill on Xarelto. No other bleeding complaints.   Past Medical History  Diagnosis Date  . DVT of leg (deep venous thrombosis) (HCC)     rt. leg  . Inferior vena caval thrombosis (HCC)     Chronic  . DVT (deep venous thrombosis) ([place]) 06/15/2011  . H/O PE (pulmonary embolism) 06/15/2011  . 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 Chronic anticoagulation 11/14/2014   He says that his brother passed of throat cancer, and his brother was also a smoker. He states that his mother had two sisters and a brother with cancer as well. He says his nephew is on a bloodthinner (with regards to blood clots) He says he thinks one of his sisters died of a blood clot as well, but doesn't know exactly what happened. He says she was maybe 60.   has Post-phlebitic syndrome; DVT (deep venous thrombosis) ([place]); H/O PE (pulmonary embolism); Congenital inferior vena cava interruption; Leg wound, left; IVC (inferior vena cava obstruction); Coumadin failure; Chronic anticoagulation; UTI (lower urinary tract infection); Cellulitis; Blepharitis; Cellulitis of right lower extremity; and Encounter for screening colonoscopy on his problem list.     has No Known Allergies.  Matthew Wise does not currently have medications on file.  Past Surgical History  Procedure Laterality Date  . None to date  10/07/15  . Colonoscopy with propofol N/A  10/29/2015    Procedure: COLONOSCOPY WITH PROPOFOL;  Surgeon: Danie Binder, MD;  [facility];  Service: Endoscopy;  Laterality: N/A;  930   Positive for hematuria. For about 3 weeks now. Went to urgent care and was given some antibiotics. These did not work. Going to Urologist next month.  Positive for leg pain. Left leg pain. Also has wound that is superficial and draining.    Denies any headaches, dizziness, double vision, fevers, chills, night sweats, nausea, vomiting, diarrhea, constipation, chest pain, heart palpitations, shortness of breath, blood in stool, black tarry stool, urinary pain, urinary burning, urinary frequency. 14 point review of systems was performed and is negative except as detailed under history of present illness and above    PHYSICAL EXAMINATION  ECOG PERFORMANCE STATUS: 1 - Symptomatic but completely ambulatory  Filed Vitals:   03/27/16 1100  BP: 157/89  Pulse: 64  Temp: 97.8 F (36.6 C)  Resp: 16    GENERAL:alert, no distress, well nourished, well developed, comfortable, cooperative and smiling SKIN: skin color, texture, turgor are normal, no rashes or significant lesions HEAD: Normocephalic, No masses, lesions, tenderness or abnormalities EYES: normal, PERRLA, EOMI, Conjunctiva are pink and non-injected EARS: External ears normal OROPHARYNX:lips, buccal mucosa, and tongue normal and mucous membranes are moist  NECK: supple, no stridor, non-tender, trachea midline LYMPH:  no palpable lymphadenopathy BREAST:not examined LUNGS: clear to auscultation  HEART: regular rate & rhythm, no murmurs and no gallops ABDOMEN:abdomen soft and normal bowel sounds BACK: Back symmetric, no curvature. EXTREMITIES:less then 2 second capillary refill, no joint deformities, effusion, or inflammation, Bilateral TED hose in place. LLE with bandage under hose NEURO: alert & oriented x 3 with fluent speech, no focal motor/sensory deficits, gait normal   LABORATORY DATA: I have reviewed the data as listed  CBC  Results for Matthew Wise (MRN [id number]) as of 03/27/2016 10:43  Ref. Range 03/27/2016 09:25  WBC Latest Ref Range: 4.0-10.5 K/uL 4.6  RBC Latest Ref Range: 4.22-5.81 MIL/uL 5.28  Hemoglobin Latest Ref Range: 13.0-17.0 g/dL 16.6  HCT Latest Ref Range: 39.0-52.0 % 46.2  MCV Latest Ref Range: 78.0-100.0 fL 87.5  MCH Latest Ref Range:  26.0-34.0 pg 31.4  MCHC Latest Ref Range: 30.0-36.0 g/dL 35.9  RDW Latest Ref Range: 11.5-15.5 % 13.9  Platelets Latest Ref Range: 150-400 K/uL 181  Neutrophils Latest Units: % 41  Lymphocytes Latest Units: % 47  Monocytes Relative Latest Units: % 11  Eosinophil Latest Units: % 1  Basophil Latest Units: % 0  NEUT# Latest Ref Range: 1.7-7.7 K/uL 1.9  Lymphocyte # Latest Ref Range: 0.7-4.0 K/uL 2.2  Monocyte # Latest Ref Range: 0.1-1.0 K/uL 0.5  Eosinophils Absolute Latest Ref Range: 0.0-0.7 K/uL 0.1  Basophils Absolute Latest Ref Range: 0.0-0.1 K/uL 0.0   ASSESSMENT AND PLAN:  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 I will also write him a prescription for some stockings and oxycodone.  He needs a prescription refill for his Xarelto.   I will refer him to Dr. Maudie Mercury for a PCP.  He will return in 6 months for a follow up. He  was advised to keep [place] apprised of his visit with urology.   All questions were answered. The patient knows to call the clinic with any problems, questions or concerns. We can certainly see the patient much sooner if necessary.  This document serves as a record of services personally performed by Ancil Linsey, MD. It was created on her behalf by Kandace Blitz, a trained medical scribe. The creation of this record is based on the scribe's personal observations and the provider's statements to them. This document has been checked and approved by the attending provider.  I have reviewed the above documentation for accuracy and completeness, and I agree with the above.  This note  is electronically signed by: Molli Hazard, MD  03/27/2016 1:01 PM

## 2016-03-27 NOTE — Patient Instructions (Signed)
[place] at [facility] Discharge Instructions  RECOMMENDATIONS MADE BY THE CONSULTANT AND ANY TEST RESULTS WILL BE SENT TO YOUR REFERRING PHYSICIAN.    Exam and discussion by Dr Whitney Muse today Referral to wound care center in [place] for wound on leg [facility]erral to [facility] for hematuria Xarelto refill Pain medication  Referral to Dr Maudie Mercury for PCP Return to see the doctor in 4 months  Please call the clinic if you have any questions or concerns    Thank you for choosing [place] at [facility] to provide your oncology and hematology care.  To afford each patient quality time with our provider, please arrive at least 15 minutes before your scheduled appointment time.   Beginning January 23rd 2017 lab work for the [facility] will be done in the  Main lab at [facility] on 1st floor. If you have a lab appointment with the [facility] please come in thru the  Main Entrance and check in at the main information desk  You need to re-schedule your appointment should you arrive 10 or more minutes late.  We strive to give you quality time with our providers, and arriving late affects you and other patients whose appointments are after yours.  Also, if you no show three or more times for appointments you may be dismissed from the clinic at the providers discretion.     Again, thank you for choosing [facility].  Our hope is that these requests will decrease the amount of time that you wait before being seen by our physicians.       _____________________________________________________________  Should you have questions after your visit to [facility], please contact our office at [phone] between the hours of 8:30 a.m. and 4:30 p.m.  Voicemails left after 4:30 p.m. will not be returned until the following business day.  For prescription refill requests, have your pharmacy contact our office.          Resources For Cancer Patients and their Caregivers ? American Cancer Society: Can assist with transportation, wigs, general needs, runs Look Good Feel Better.        724-699-5034 ? Cancer Care: Provides financial assistance, online support groups, medication/co-pay assistance.  1-800-813-HOPE 585-790-5348) ? [facility] Assists [place] cancer patients and their families through emotional , educational and financial support.  7197256321 ? [place] DSS Where to apply for food stamps, Medicaid and utility assistance. 773-292-2816 ? [employer]: Transportation to medical appointments. 2264881298 ? Social Security Administration: May apply for disability if have a Stage IV cancer. 4081706485 386-774-4062 ? [employer], Disability and Transit Services: Assists with nutrition, care and transit needs. [phone]

## 2016-03-30 ENCOUNTER — Telehealth (HOSPITAL_COMMUNITY): Payer: Self-pay

## 2016-03-30 ENCOUNTER — Ambulatory Visit (HOSPITAL_COMMUNITY): Payer: Medicare Other | Admitting: Physical Therapy

## 2016-03-30 NOTE — Telephone Encounter (Signed)
5/1 cx appt - said he had another appt that he forgot about -- was rescheduled

## 2016-04-01 ENCOUNTER — Ambulatory Visit (HOSPITAL_COMMUNITY): Payer: Medicare Other | Attending: Hematology & Oncology | Admitting: Physical Therapy

## 2016-04-01 DIAGNOSIS — S81801S Unspecified open wound, right lower leg, sequela: Secondary | ICD-10-CM

## 2016-04-01 DIAGNOSIS — S81802S Unspecified open wound, left lower leg, sequela: Secondary | ICD-10-CM | POA: Insufficient documentation

## 2016-04-01 DIAGNOSIS — M79661 Pain in right lower leg: Secondary | ICD-10-CM | POA: Diagnosis not present

## 2016-04-01 DIAGNOSIS — I83023 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: Secondary | ICD-10-CM | POA: Insufficient documentation

## 2016-04-01 DIAGNOSIS — X58XXXS Exposure to other specified factors, sequela: Secondary | ICD-10-CM | POA: Insufficient documentation

## 2016-04-01 DIAGNOSIS — I83012 Varicose veins of right lower extremity with ulcer of calf: Secondary | ICD-10-CM | POA: Diagnosis not present

## 2016-04-01 DIAGNOSIS — I83018 Varicose veins of right lower extremity with ulcer other part of lower leg: Secondary | ICD-10-CM | POA: Diagnosis not present

## 2016-04-01 NOTE — Therapy (Signed)
[place], [place], [address] Phone: [phone]   Fax:  [phone]  Wound Care Evaluation  Patient Details  Name: Matthew Wise MRN: [id number] Date of Birth: 22-Dec-1960 No Data Recorded  Encounter Date: 04/01/2016      PT End of Session - 04/01/16 1459    Visit Number 1   Number of Visits 16   Date for PT Re-Evaluation 04/29/16   Authorization Type Medicare    Authorization Time Period 04/01/16 to 06/21/16   Authorization - Visit Number 1   Authorization - Number of Visits 10   PT Start Time 1346   PT Stop Time 1430   PT Time Calculation (min) 44 min   Activity Tolerance Patient tolerated treatment well   Behavior During Therapy [facility] for tasks assessed/performed      Past Medical History  Diagnosis Date  . DVT of leg (deep venous thrombosis) (HCC)     rt. leg  . Inferior vena caval thrombosis (HCC)     Chronic  . DVT (deep venous thrombosis) ([place]) 06/15/2011  . H/O PE (pulmonary embolism) 06/15/2011  . 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 Chronic anticoagulation 11/14/2014    Past Surgical History  Procedure Laterality Date  . None to date  10/07/15  . Colonoscopy with propofol N/A 10/29/2015    Procedure: COLONOSCOPY WITH PROPOFOL;  Surgeon: Danie Binder, MD;  [facility];  Service: Endoscopy;  Laterality: N/A;  930    There were no vitals filed for this visit.         Wound Therapy - 04/01/16 1443    Subjective Patient reports that he noticed the wound opening up on his L leg a couple of weeks ago, just a couple days ago also noticed some drainage coming from the back of his R leg too. Has been cleaning and using lymph wraps on wounds. Still taking his blood thinners as prescribed.    Patient and Family Stated Goals wounds to heal    Date of Onset --  a couple of weeks ago    Pain  Assessment 0-10   Pain Score 4    Pain Type Acute pain   Pain Location Tibia   Pain Orientation Anterior;Medial   Pain Descriptors / Indicators Aching;Sharp   Pain Onset Unable to tell   Patients Stated Pain Goal 0   Evaluation and Treatment Procedures Explained to Patient/Family Yes   Evaluation and Treatment Procedures agreed to   Wound Properties Date First Assessed: 04/01/16 Wound Type: Other (Comment) , anterior medial L tibia   Location: Tibial Location Orientation: Left;Anterior;Medial Present on Admission: Yes   Dressing Type Other (Comment)  lymph wraps    Dressing Changed New   Dressing Status Old drainage   Dressing Change Frequency PRN   % Wound base Red or Granulating 0%   % Wound base Yellow 100%   Peri-wound Assessment Edema;Other (Comment)  dry skin    Wound Length (cm) 3 cm   Wound Width (cm) 2 cm   Wound Depth (cm) --  approx 0.01 cm   Margins Unattached edges (unapproximated)   Closure None   Drainage Amount Scant   Drainage Description Serosanguineous   Treatment Cleansed;Packing (Impregnated strip);Other (Comment)  medihoney, vasoline perimeter, gauze, profore    Wound Properties Date First Assessed: 04/01/16 Wound Type: Other (Comment) , R posterior calf  Location: Tibial Location Orientation: Posterior;Right Wound Description (Comments): posterior R calf  Present on Admission: Yes   Dressing Type Other (Comment)  lymph wraps    Dressing Changed New   Dressing Status Old drainage   Dressing Change Frequency PRN   % Wound base Red or Granulating 50%   % Wound base Yellow 50%   Peri-wound Assessment Edema;Other (Comment)  very dry    Wound Length (cm) 1.2 cm   Wound Width (cm) 1.8 cm   Wound Depth (cm) --  approx 0.1   Margins Unattached edges (unapproximated)   Closure None   Drainage Amount Scant   Drainage Description Serosanguineous   Treatment Cleansed;Packing (Impregnated strip);Tape changed;Pressure applied;Other (Comment)  medihoney,  vasoline perimeter, gauze, profore    Wound Therapy - Clinical Statement Patient arrives with multiple wounds on his LEs, which he reports only came up recently. Upon examinatoin, patient has one wound on R LE and one on L LE. [place] wound appears very deep right now but L LE requires further investigation of depth due to adherent eschar covering entirety of wound. Applied medihoney, vasoline perimeter, gauze, and profore to both wounds today. Did note a possible third wound starting on anterior R tibia and will continue to monitor/addrses if needed. Patient will benefit from skilled PT services in order to facilitate full wound healing at this time.     Wound Therapy - Functional Problem List non healing wounds    Factors Delaying/Impairing Wound Healing Altered sensation;Vascular compromise;Other (comment)  lymphedema    Wound Therapy - Frequency Other (comment)  2x/week    Wound Therapy - Current Recommendations PT   Wound Plan medihoney, vasoline, profore; monitor R anterior tibia area                          PT Education - 04/01/16 1458    Education provided Yes   Education Details prognosis, plan of care, cut off profore with toe pain or collor change    Person(s) Educated Patient   Methods Explanation   Comprehension Verbalized understanding          PT Short Term Goals - 04/01/16 1459    PT SHORT TERM GOAL #1   Title Patient to present with 0% slough on all wounds and 100% red granulation tissue to demonstrate overall improvement of condition    Time 4   Period Weeks   Status New   PT SHORT TERM GOAL #2   Title Patient to demonstarte improved moisture in tissues surrounding wounds in order to facilitate good healing process    Time 4   Period Weeks   Status New   PT SHORT TERM GOAL #3   Title All wound dimensions to have decreased by at least 40% in order to demonstarte improved condition    Time 4   Period Weeks   Status New           PT Long  Term Goals - 04/01/16 1503    PT LONG TERM GOAL #1   Title Patient to demonstrate full wound healing in all wounds   Time 8   Period Weeks   Status New   PT LONG TERM GOAL #2   Title Patient to be wearing compression at all times to assist in preventing recurrence of wounds in face of lymphedema co-morbidity    Time 8   Period Weeks   Status New  Patient will benefit from skilled therapeutic intervention in order to improve the following deficits and impairments:     Visit Diagnosis: Unspecified open wound, right lower leg, sequela - Plan: PT plan of care cert/re-cert  Open wound, lower leg, left, sequela - Plan: PT plan of care cert/re-cert  Pain in right lower leg - Plan: PT plan of care cert/re-cert      G-Codes - [id number]    Functional Assessment Tool Used Based on wound status, presence of slough    Functional Limitation Mobility: Walking and moving around   Mobility: Walking and Moving Around Current Status [phone]) At least 60 percent but less than 80 percent impaired, limited or restricted   Mobility: Walking and Moving Around Goal Status [phone]) At least 40 percent but less than 60 percent impaired, limited or restricted      Problem List Patient Active Problem List   Diagnosis Date Noted  . Encounter for screening colonoscopy 10/07/2015  . Cellulitis of right lower extremity   . UTI (lower urinary tract infection) 02/03/2015  . Cellulitis 02/03/2015  . Blepharitis 02/03/2015  . Chronic anticoagulation 11/14/2014  . 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 Leg wound, left 08/19/2011  . DVT (deep venous thrombosis) ([place]) 06/15/2011  . H/O PE (pulmonary embolism) 06/15/2011  . 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    Deniece Ree PT, DPT [facility] [address] [place], [place],  [address] Phone: [phone]   Fax:  [phone]  Name: Matthew Wise MRN: [id number] Date of Birth: 02-04-1961

## 2016-04-03 ENCOUNTER — Ambulatory Visit (HOSPITAL_COMMUNITY): Payer: Medicare Other | Admitting: Physical Therapy

## 2016-04-03 ENCOUNTER — Other Ambulatory Visit: Payer: Self-pay | Admitting: Urology

## 2016-04-03 ENCOUNTER — Ambulatory Visit (INDEPENDENT_AMBULATORY_CARE_PROVIDER_SITE_OTHER): Payer: Medicare Other | Admitting: Urology

## 2016-04-03 DIAGNOSIS — I861 Scrotal varices: Secondary | ICD-10-CM | POA: Diagnosis not present

## 2016-04-03 DIAGNOSIS — R310 Gross hematuria: Secondary | ICD-10-CM

## 2016-04-03 DIAGNOSIS — R3129 Other microscopic hematuria: Secondary | ICD-10-CM | POA: Diagnosis not present

## 2016-04-05 ENCOUNTER — Encounter (HOSPITAL_COMMUNITY): Payer: Self-pay | Admitting: Hematology & Oncology

## 2016-04-06 ENCOUNTER — Ambulatory Visit (HOSPITAL_COMMUNITY): Payer: Medicare Other | Admitting: Physical Therapy

## 2016-04-06 DIAGNOSIS — I83023 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: Secondary | ICD-10-CM | POA: Diagnosis not present

## 2016-04-06 DIAGNOSIS — S81801S Unspecified open wound, right lower leg, sequela: Secondary | ICD-10-CM | POA: Diagnosis not present

## 2016-04-06 DIAGNOSIS — I83012 Varicose veins of right lower extremity with ulcer of calf: Secondary | ICD-10-CM | POA: Diagnosis not present

## 2016-04-06 DIAGNOSIS — I83018 Varicose veins of right lower extremity with ulcer other part of lower leg: Secondary | ICD-10-CM | POA: Diagnosis not present

## 2016-04-06 DIAGNOSIS — S81802S Unspecified open wound, left lower leg, sequela: Secondary | ICD-10-CM | POA: Diagnosis not present

## 2016-04-06 DIAGNOSIS — M79661 Pain in right lower leg: Secondary | ICD-10-CM | POA: Diagnosis not present

## 2016-04-06 NOTE — Therapy (Signed)
[place], [place], [address] Phone: [phone]   Fax:  [phone]  Wound Care Therapy  Patient Details  Name: Matthew Wise MRN: [id number] Date of Birth: [birth year] No Data Recorded  Encounter Date: 04/06/2016      PT End of Session - 04/06/16 1812    Visit Number 2   Number of Visits 16   Date for PT Re-Evaluation 04/29/16   Authorization Type Medicare    Authorization Time Period 04/01/16 to 06/21/16   Authorization - Visit Number 2   Authorization - Number of Visits 10   PT Start Time 1520   PT Stop Time 1600   PT Time Calculation (min) 40 min   Activity Tolerance Patient tolerated treatment well   Behavior During Therapy [facility] for tasks assessed/performed      Past Medical History  Diagnosis Date  . DVT of leg (deep venous thrombosis) (HCC)     rt. leg  . Inferior vena caval thrombosis (HCC)     Chronic  . DVT (deep venous thrombosis) ([place]) 06/15/2011  . H/O PE (pulmonary embolism) 06/15/2011  . 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 Chronic anticoagulation 11/14/2014    Past Surgical History  Procedure Laterality Date  . None to date  10/07/15  . Colonoscopy with propofol N/A 10/29/2015    Procedure: COLONOSCOPY WITH PROPOFOL;  Surgeon: Danie Binder, MD;  [facility];  Service: Endoscopy;  Laterality: N/A;  930    There were no vitals filed for this visit.                  Wound Therapy - 04/06/16 1659    Subjective Pt states the dressing fell down the evening he left our clinic.  States he has had no dressing on it since.  Pt reports he has an order for a new compression stocking.   Patient and Family Stated Goals wounds to heal    Date of Onset --  a couple of weeks ago    Evaluation and Treatment Procedures Explained to Patient/Family Yes   Evaluation and Treatment  Procedures agreed to   Wound Properties Date First Assessed: 04/01/16 Wound Type: Other (Comment) , anterior medial L tibia   Location: Tibial Location Orientation: Left;Anterior;Medial Present on Admission: Yes   Dressing Type Other (Comment)  lymph wraps    Dressing Changed New  none upon arrival   Dressing Status Old drainage   Dressing Change Frequency PRN   % Wound base Red or Granulating 0%   % Wound base Yellow 100%   Peri-wound Assessment Edema;Other (Comment)  dry skin    Margins Unattached edges (unapproximated)   Closure None   Drainage Amount Minimal   Drainage Description Serosanguineous   Treatment Cleansed;Debridement (Selective)   Wound Properties Date First Assessed: 04/01/16 Wound Type: Other (Comment) , R posterior calf   Location: Tibial Location Orientation: Posterior;Right Wound Description (Comments): posterior R calf  Present on Admission: Yes   Dressing Type Other (Comment)  lymph wraps    Dressing Changed New  no dressing upon arrival   Dressing Status Old drainage   Dressing Change Frequency PRN   % Wound base Red or Granulating 50%   % Wound base Yellow 50%   Peri-wound Assessment Edema;Other (Comment)  very dry    Margins Unattached edges (unapproximated)   Closure None  Drainage Amount Minimal   Drainage Description Serosanguineous   Treatment Cleansed;Debridement (Selective)   Wound Therapy - Clinical Statement PT without dressings on LE's.  Pt did not bring compression bandages for LE including his foam.  cleansed and debrided LE"s well.  Noted several dispursed skin eruptions on bilateral LE's with drainage.  Removed large amout of dry skin and necrotic tissue.  Rt anterior LE wound appears to have never healed fully from last session.  All areas dressed with medihoney gel.  Moisturized bilateral LE's well and redressed with profore.  Instructed pt to go ahead and get his new garments from [facility] and to bring compression bandages including  foam next session.  We will use bandages on Rt LE and continue with profore on Lt LE.     Wound Therapy - Functional Problem List non healing wounds    Factors Delaying/Impairing Wound Healing Altered sensation;Vascular compromise;Other (comment)  lymphedema    Wound Therapy - Frequency Other (comment)  2x/week    Wound Therapy - Current Recommendations PT   Wound Plan medihoney, vasoline, profore; monitor R anterior tibia area                    PT Short Term Goals - 04/01/16 1459    PT SHORT TERM GOAL #1   Title Patient to present with 0% slough on all wounds and 100% red granulation tissue to demonstrate overall improvement of condition    Time 4   Period Weeks   Status New   PT SHORT TERM GOAL #2   Title Patient to demonstarte improved moisture in tissues surrounding wounds in order to facilitate good healing process    Time 4   Period Weeks   Status New   PT SHORT TERM GOAL #3   Title All wound dimensions to have decreased by at least 40% in order to demonstarte improved condition    Time 4   Period Weeks   Status New           PT Long Term Goals - 04/01/16 1503    PT LONG TERM GOAL #1   Title Patient to demonstrate full wound healing in all wounds   Time 8   Period Weeks   Status New   PT LONG TERM GOAL #2   Title Patient to be wearing compression at all times to assist in preventing recurrence of wounds in face of lymphedema co-morbidity    Time 8   Period Weeks   Status New             Patient will benefit from skilled therapeutic intervention in order to improve the following deficits and impairments:     Visit Diagnosis: Unspecified open wound, right lower leg, sequela  Open wound, lower leg, left, sequela     Problem List Patient Active Problem List   Diagnosis Date Noted  . Encounter for screening colonoscopy 10/07/2015  . Cellulitis of right lower extremity   . UTI (lower urinary tract infection) 02/03/2015  . Cellulitis  02/03/2015  . Blepharitis 02/03/2015  . Chronic anticoagulation 11/14/2014  . 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 Leg wound, left 08/19/2011  . DVT (deep venous thrombosis) ([place]) 06/15/2011  . H/O PE (pulmonary embolism) 06/15/2011  . 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

## 2016-04-09 ENCOUNTER — Encounter: Payer: Self-pay | Admitting: Gastroenterology

## 2016-04-10 ENCOUNTER — Ambulatory Visit (HOSPITAL_COMMUNITY)
Admission: RE | Admit: 2016-04-10 | Discharge: 2016-04-10 | Disposition: A | Discharge status: Home / Self Care | Payer: Medicare Other | Source: Ambulatory Visit | Attending: Urology | Admitting: Urology

## 2016-04-10 ENCOUNTER — Ambulatory Visit (HOSPITAL_COMMUNITY): Payer: Medicare Other

## 2016-04-10 ENCOUNTER — Ambulatory Visit (HOSPITAL_COMMUNITY): Payer: Medicare Other | Admitting: Physical Therapy

## 2016-04-10 DIAGNOSIS — I83012 Varicose veins of right lower extremity with ulcer of calf: Secondary | ICD-10-CM | POA: Diagnosis not present

## 2016-04-10 DIAGNOSIS — R310 Gross hematuria: Secondary | ICD-10-CM | POA: Diagnosis not present

## 2016-04-10 DIAGNOSIS — I83018 Varicose veins of right lower extremity with ulcer other part of lower leg: Secondary | ICD-10-CM

## 2016-04-10 DIAGNOSIS — L97329 Non-pressure chronic ulcer of left ankle with unspecified severity: Secondary | ICD-10-CM

## 2016-04-10 DIAGNOSIS — L97219 Non-pressure chronic ulcer of right calf with unspecified severity: Principal | ICD-10-CM

## 2016-04-10 DIAGNOSIS — L97309 Non-pressure chronic ulcer of unspecified ankle with unspecified severity: Secondary | ICD-10-CM

## 2016-04-10 DIAGNOSIS — S81801S Unspecified open wound, right lower leg, sequela: Secondary | ICD-10-CM | POA: Diagnosis not present

## 2016-04-10 DIAGNOSIS — I83023 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: Secondary | ICD-10-CM

## 2016-04-10 DIAGNOSIS — S81802S Unspecified open wound, left lower leg, sequela: Secondary | ICD-10-CM | POA: Diagnosis not present

## 2016-04-10 DIAGNOSIS — L97819 Non-pressure chronic ulcer of other part of right lower leg with unspecified severity: Secondary | ICD-10-CM

## 2016-04-10 DIAGNOSIS — M79661 Pain in right lower leg: Secondary | ICD-10-CM | POA: Diagnosis not present

## 2016-04-10 DIAGNOSIS — I82221 Chronic embolism and thrombosis of inferior vena cava: Secondary | ICD-10-CM | POA: Insufficient documentation

## 2016-04-10 DIAGNOSIS — N289 Disorder of kidney and ureter, unspecified: Secondary | ICD-10-CM | POA: Diagnosis not present

## 2016-04-10 IMAGING — CT CT ABD-PEL WO/W CM
3 of 10 series · 12 of 46 positions shown, 18 images · IV contrast (Omnipaque 300)
Comparison: [DATE]

CLINICAL DATA: Gross hematuria for 1 month.

EXAM:
CT ABDOMEN AND PELVIS WITHOUT AND WITH CONTRAST
TECHNIQUE: Multidetector CT imaging of the abdomen and pelvis was performed
following the standard protocol before and following the bolus
administration of intravenous contrast.
CONTRAST:  125mL [VI] IOPAMIDOL ([VI]) INJECTION 61%

[Series 3: hematuria pre contrast (id) · axial · non-contrast · 0.95mm/px · z∈[-468,-58]mm · 8 of 106 slices shown, 13 images]
[im 12/106  soft-tissue]
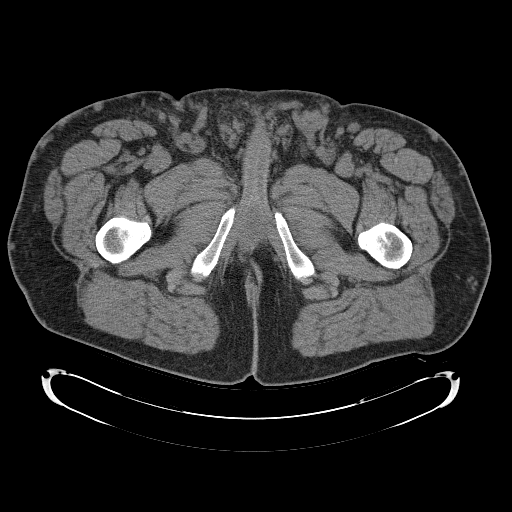
[im 12/106  bone]
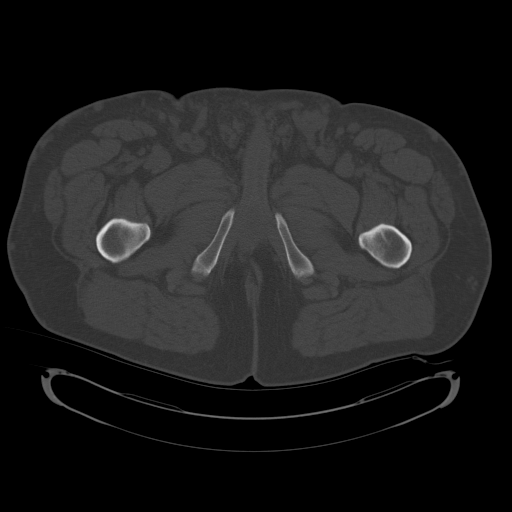
[im 24/106  soft-tissue]
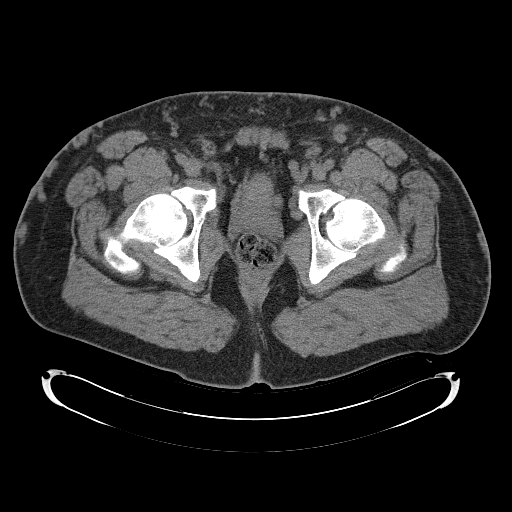
[im 36/106  soft-tissue]
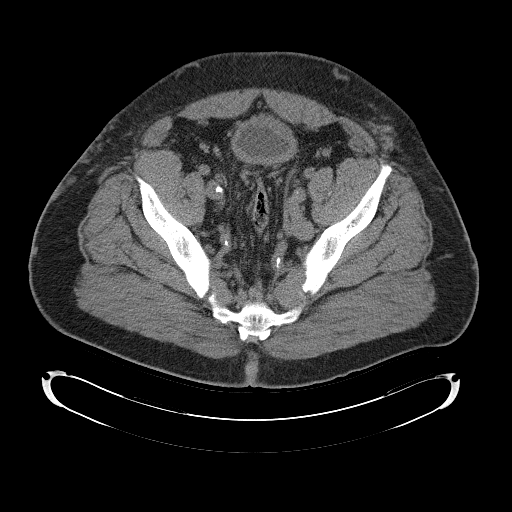
[im 47/106  soft-tissue]
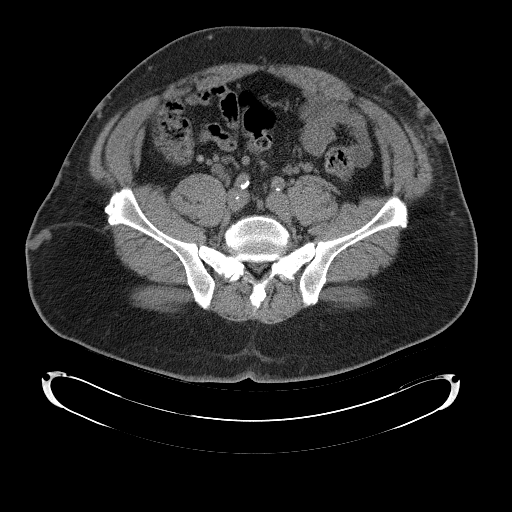
[im 59/106  soft-tissue]
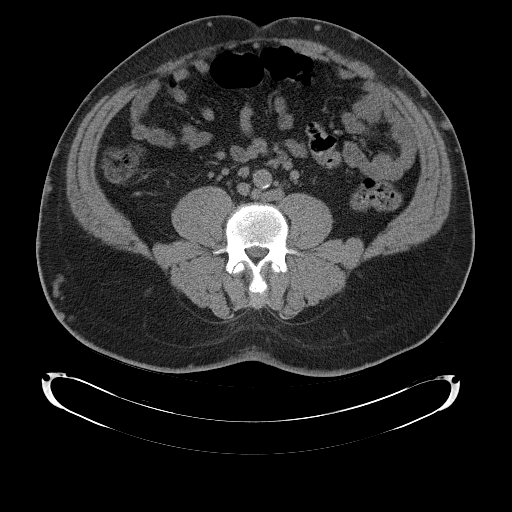
[im 59/106  lung]
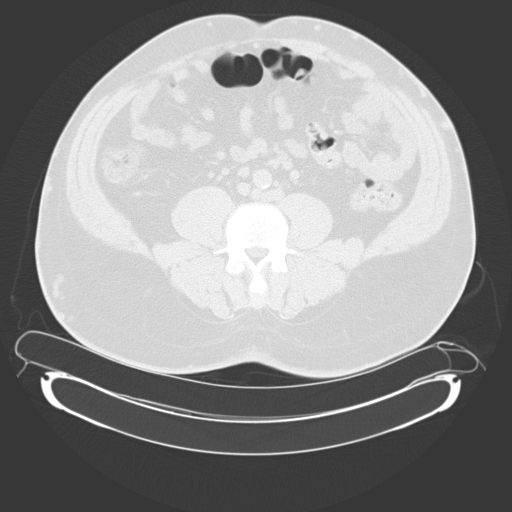
[im 71/106  soft-tissue]
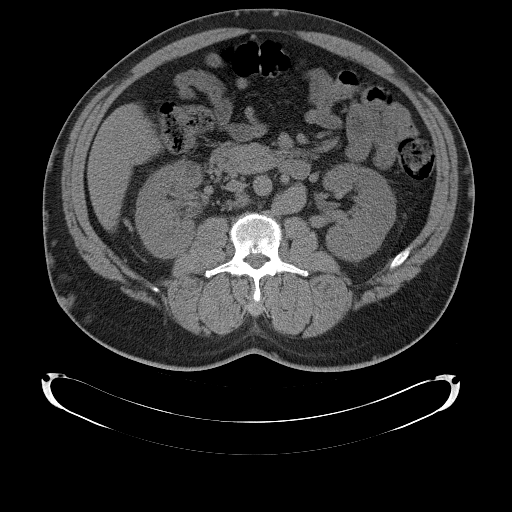
[im 71/106  lung]
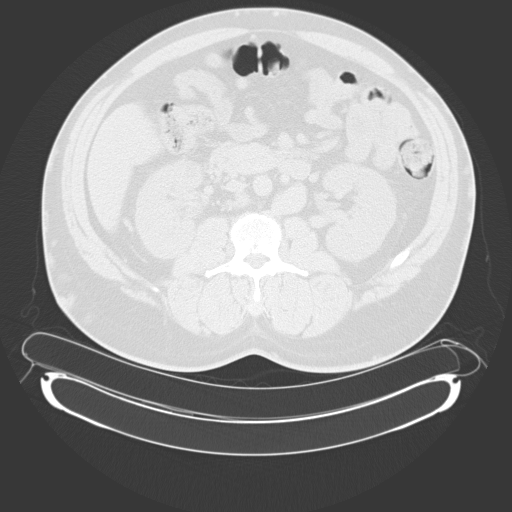
[im 82/106  soft-tissue]
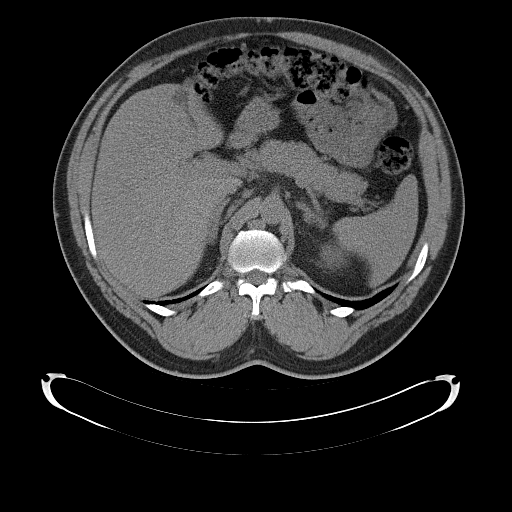
[im 82/106  lung]
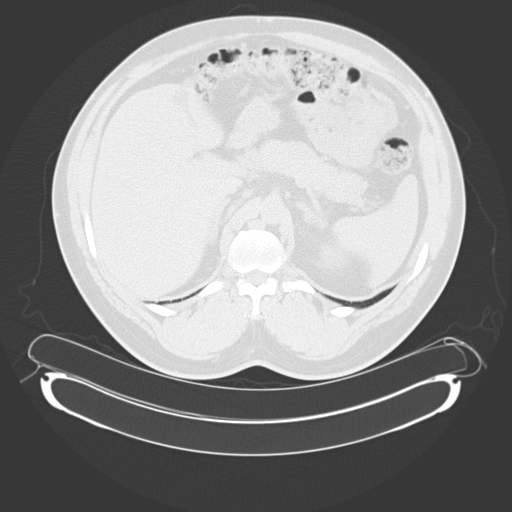
[im 94/106  soft-tissue]
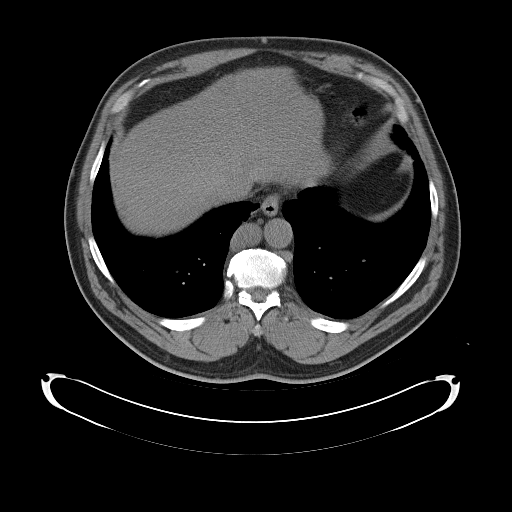
[im 94/106  lung]
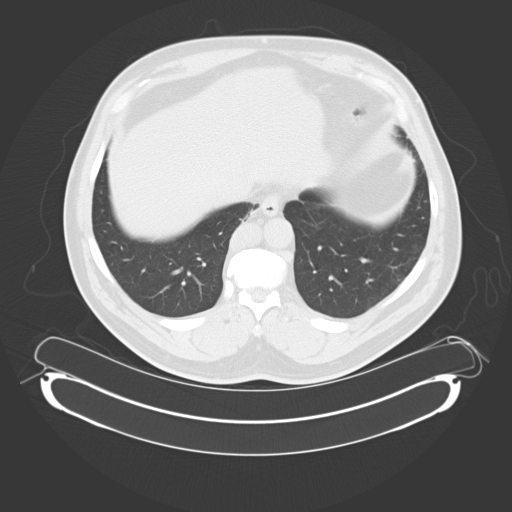

[Series 4: hematuria mpr coro pre (id) · coronal · non-contrast · 0.84mm/px · 2 of 113 slices shown, 3 images]
[im 38/113  soft-tissue]
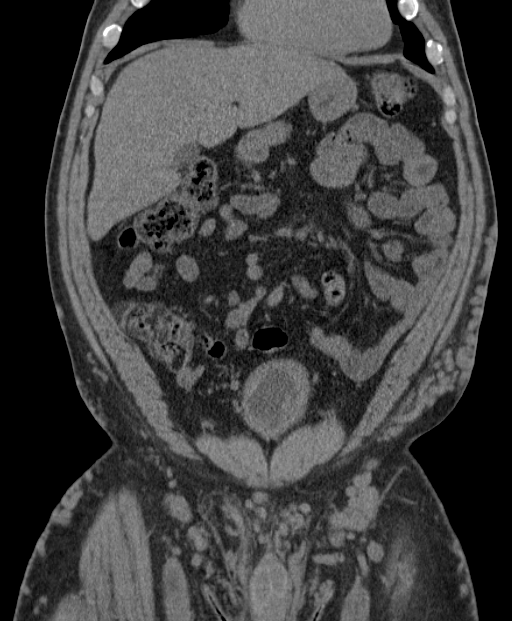
[im 38/113  bone]
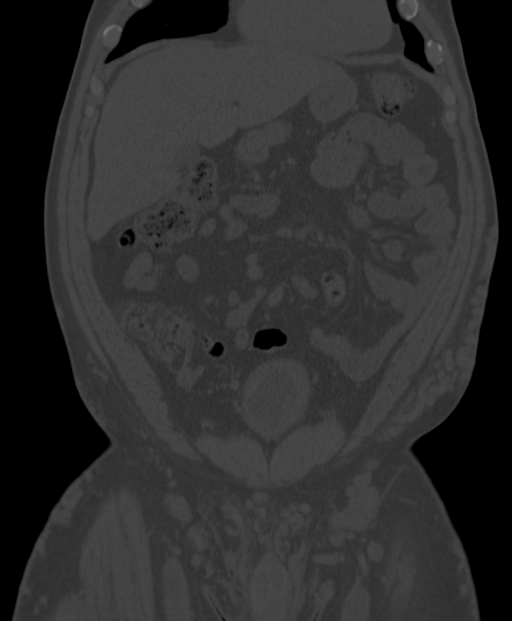
[im 75/113  soft-tissue]
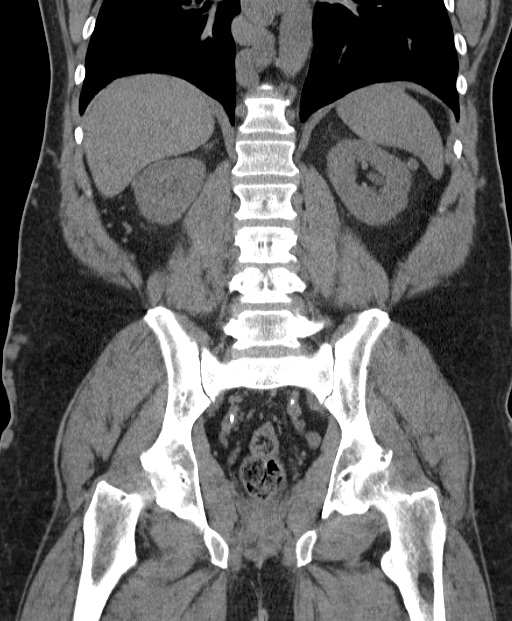

[Series 6: hematuria post contrast (id) · axial · 0.95mm/px · z∈[-468,-408]mm · 2 of 106 slices shown]
[im 12/106  soft-tissue]
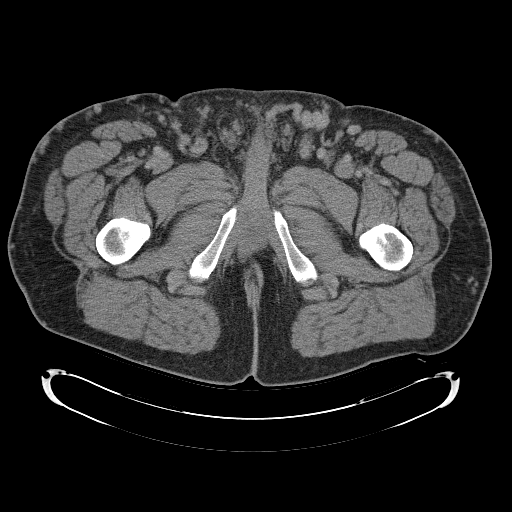
[im 24/106  soft-tissue]
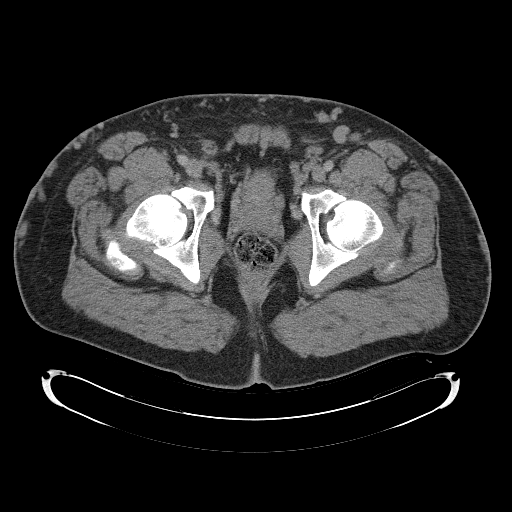

[12 of 46 positions shown; findings below may reference images not displayed]

FINDINGS: Lower chest: Lung bases are clear.

Hepatobiliary: Low attenuation triangular lesion in the central LEFT
hepatic lobe along the falciform ligament is unchanged from prior
and likely represents a large field focal fatty infiltration. Normal
gallbladder. No biliary duct dilatation.

Pancreas: Pancreas is normal. No ductal dilatation. No pancreatic
inflammation.

Spleen: Normal spleen

Adrenals/urinary tract: Adrenal glands are normal.

No nephrolithiasis or ureterolithiasis. Cortical phase imaging
demonstrates no enhancing renal lesion. There is a simple fluid
attenuation cortical lesion measuring 14 mm in the RIGHT kidney.
Similar medial 10 mm cortical lesion on image 33, series 6.

Delayed pyelogram phase imaging demonstrates no filling defects
within the collecting systems or ureters.

The bladder is thick-walled. The bladder wall thickening is
asymmetric and greater along the anterior and LEFT lateral wall
measuring between 13 to 15 mm (image 68, series 6). This also seen
on delayed series 7.

Stomach/Bowel: Stomach, small bowel, appendix, and cecum are normal.
The colon and rectosigmoid colon are normal.

Vascular/Lymphatic: Mild intimal wall calcification abdominal aorta.
There is chronic thrombosis of the IVC. There is extensive venous
collateralization within the abdominal wall as well as through the
hemi azygous system.

Reproductive: Prostate normal

Other: No free fluid.

Musculoskeletal: No aggressive osseous lesion.
IMPRESSION: 1. Asymmetric thickening of the bladder wall.
2. No enhancing renal lesion. No nephrolithiasis or
ureterolithiasis.
3. Low-density lesions of the RIGHT kidney are favored benign renal
cysts.
4. Chronic occlusion of the inferior vena cava extensive venous
collateralization

## 2016-04-10 MED ORDER — IOPAMIDOL (ISOVUE-300) INJECTION 61%
125.0000 mL | Freq: Once | INTRAVENOUS | Status: AC | PRN
  Administered 2016-04-10: 125 mL via INTRAVENOUS

## 2016-04-10 MED ORDER — IOPAMIDOL (ISOVUE-370) INJECTION 76%: 125.0000 mL | Freq: Once | INTRAVENOUS | Status: DC | PRN

## 2016-04-10 NOTE — Therapy (Signed)
[place], [place], [address] Phone: [phone]   Fax:  [phone]  Wound Care Therapy  Patient Details  Name: RJ STARZYNSKI MRN: [id number] Date of Birth: 04-Jan-1961 No Data Recorded  Encounter Date: 04/10/2016      PT End of Session - 04/10/16 1621    Visit Number 3   Number of Visits 16   Date for PT Re-Evaluation 04/29/16   Authorization Type Medicare    Authorization Time Period 04/01/16 to 06/21/16   Authorization - Visit Number 3   Authorization - Number of Visits 10   PT Start Time [id number]   PT Stop Time 1600   PT Time Calculation (min) 45 min   Activity Tolerance Patient tolerated treatment well      Past Medical History  Diagnosis Date  . DVT of leg (deep venous thrombosis) (HCC)     rt. leg  . Inferior vena caval thrombosis (HCC)     Chronic  . DVT (deep venous thrombosis) ([place]) 06/15/2011  . H/O PE (pulmonary embolism) 06/15/2011  . 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 Chronic anticoagulation 11/14/2014    Past Surgical History  Procedure Laterality Date  . None to date  10/07/15  . Colonoscopy with propofol N/A 10/29/2015    Procedure: COLONOSCOPY WITH PROPOFOL;  Surgeon: Danie Binder, MD;  [facility];  Service: Endoscopy;  Laterality: N/A;  930    There were no vitals filed for this visit.                  Wound Therapy - 04/10/16 1605    Subjective Pt states that the wounds on his right leg is draining quite a bit.  Left leg wounds are draining as well but not as bad.    Patient and Family Stated Goals wounds to heal    Date of Onset --  a couple of weeks ago    Pain Assessment 0-10   Pain Score 7   with debridement; generally Rt LE is a 4/10    Pain Type Acute pain   Pain Location Leg   Pain Orientation Lower   Pain Descriptors / Indicators Throbbing   Pain  Onset With Activity   Patients Stated Pain Goal 0   Evaluation and Treatment Procedures Explained to Patient/Family Yes   Evaluation and Treatment Procedures agreed to   Wound Properties Date First Assessed: 04/01/16 Wound Type: Other (Comment) , anterior medial L tibia   Location: Tibial Location Orientation: Left;Anterior;Medial Present on Admission: Yes   Dressing Type Other (Comment);Alginate  lymph wraps    Dressing Changed New  Pt comes to department with juxtafit on   Dressing Status Old drainage   Dressing Change Frequency PRN   % Wound base Red or Granulating 20%   % Wound base Yellow 80%   Peri-wound Assessment Edema;Other (Comment)  dry skin    Margins Unattached edges (unapproximated)   Closure None   Drainage Amount Minimal   Drainage Description Serosanguineous   Treatment Cleansed;Debridement (Selective);Other (Comment)  compression dressing.    Wound Properties Date First Assessed: 04/01/16 Wound Type: Other (Comment) , R posterior calf   Location: Tibial Location Orientation: Posterior;Right Wound Description (Comments): posterior R calf  Present on Admission: Yes   Dressing Type Other (Comment)  lymph wraps    Dressing Status Old drainage   Dressing Change  Frequency PRN   % Wound base Red or Granulating 65%   % Wound base Yellow 35%   Peri-wound Assessment Edema;Other (Comment)  very dry    Margins Unattached edges (unapproximated)   Closure None   Drainage Amount Minimal   Drainage Description Serosanguineous   Treatment Cleansed;Debridement (Selective);Other (Comment)  compression dressing    Wound Properties Date First Assessed: 04/10/16 Time First Assessed: 1532 Wound Type: Other (Comment) Location: Leg Location Orientation: Right;Anterior Wound Description (Comments): Approximately 3x 1.5 on superior anterior aspect.    Dressing Type Alginate   Dressing Changed New   Dressing Change Frequency PRN   Site / Wound Assessment Yellow   % Wound base Red or  Granulating 0%   % Wound base Yellow 100%   Peri-wound Assessment Induration   Drainage Amount Minimal   Drainage Description Purulent   Treatment Cleansed;Debridement (Selective);Other (Comment)   Selective Debridement - Location all wound beds    Selective Debridement - Tools Used Forceps   Selective Debridement - Tissue Removed slough    Wound Therapy - Clinical Statement Pt brought foam and compression bandages.  Pt LE cleansed, debrided and moisturized.  Therapist placed alginate dressing on all wounds to prevent soiling of short stretch bandage.  Compression bandaging with foam and mulitlayer short stretch bandages to right LE with profore multilayer compression bandaging to left.  Pt advised to request two time slots so that manual lymphatic drainage can be added to plain of care.    Wound Therapy - Functional Problem List non healing wounds    Factors Delaying/Impairing Wound Healing Altered sensation;Vascular compromise;Other (comment)  lymphedema    Hydrotherapy Plan Debridement;Dressing change  manual lymphatic drainage to B LE; measure Rt ant. wound   Wound Therapy - Frequency Other (comment)  2x/week    Wound Therapy - Current Recommendations PT   Wound Plan alginate, vaseline, profore;to Lt ; short strech to RT.                  PT Education - 04/10/16 1620    Education provided Yes   Education Details to take dressing off if pt has any increased pain and place juxtafit on LE.    Person(s) Educated Patient   Methods Explanation          PT Short Term Goals - 04/01/16 1459    PT SHORT TERM GOAL #1   Title Patient to present with 0% slough on all wounds and 100% red granulation tissue to demonstrate overall improvement of condition    Time 4   Period Weeks   Status New   PT SHORT TERM GOAL #2   Title Patient to demonstarte improved moisture in tissues surrounding wounds in order to facilitate good healing process    Time 4   Period Weeks   Status New    PT SHORT TERM GOAL #3   Title All wound dimensions to have decreased by at least 40% in order to demonstarte improved condition    Time 4   Period Weeks   Status New           PT Long Term Goals - 04/01/16 1503    PT LONG TERM GOAL #1   Title Patient to demonstrate full wound healing in all wounds   Time 8   Period Weeks   Status New   PT LONG TERM GOAL #2   Title Patient to be wearing compression at all times to assist in preventing recurrence of wounds in face  of lymphedema co-morbidity    Time 8   Period Weeks   Status New             Patient will benefit from skilled therapeutic intervention in order to improve the following deficits and impairments:     Visit Diagnosis: 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 Encounter for screening colonoscopy 10/07/2015  . Cellulitis of right lower extremity   . UTI (lower urinary tract infection) 02/03/2015  . Cellulitis 02/03/2015  . Blepharitis 02/03/2015  . Chronic anticoagulation 11/14/2014  . 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 Leg wound, left 08/19/2011  . DVT (deep venous thrombosis) ([place]) 06/15/2011  . H/O PE (pulmonary embolism) 06/15/2011  . 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 [place], [place], [address] Phone: [phone]   Fax:  [phone]  Name: TILLMON ENTRIKIN MRN: [id number] Date of Birth: October 19, 1961

## 2016-04-14 ENCOUNTER — Ambulatory Visit (HOSPITAL_COMMUNITY): Payer: Medicare Other | Admitting: Physical Therapy

## 2016-04-14 DIAGNOSIS — S81802S Unspecified open wound, left lower leg, sequela: Secondary | ICD-10-CM | POA: Diagnosis not present

## 2016-04-14 DIAGNOSIS — S81801S Unspecified open wound, right lower leg, sequela: Secondary | ICD-10-CM | POA: Diagnosis not present

## 2016-04-14 DIAGNOSIS — I83012 Varicose veins of right lower extremity with ulcer of calf: Secondary | ICD-10-CM | POA: Diagnosis not present

## 2016-04-14 DIAGNOSIS — I83018 Varicose veins of right lower extremity with ulcer other part of lower leg: Secondary | ICD-10-CM | POA: Diagnosis not present

## 2016-04-14 DIAGNOSIS — L97819 Non-pressure chronic ulcer of other part of right lower leg with unspecified severity: Secondary | ICD-10-CM

## 2016-04-14 DIAGNOSIS — I83023 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: Secondary | ICD-10-CM

## 2016-04-14 DIAGNOSIS — M79661 Pain in right lower leg: Secondary | ICD-10-CM | POA: Diagnosis not present

## 2016-04-14 DIAGNOSIS — L97219 Non-pressure chronic ulcer of right calf with unspecified severity: Principal | ICD-10-CM

## 2016-04-14 DIAGNOSIS — L97309 Non-pressure chronic ulcer of unspecified ankle with unspecified severity: Secondary | ICD-10-CM

## 2016-04-14 NOTE — Therapy (Signed)
[place], [place], [address] Phone: [phone]   Fax:  [phone]  Wound Care Therapy  Patient Details  Name: Matthew Wise MRN: [id number] Date of Birth: 01/24/1961 No Data Recorded  Encounter Date: 04/14/2016      PT End of Session - 04/14/16 1730    Visit Number 4   Number of Visits 16   Date for PT Re-Evaluation 04/29/16   Authorization Type Medicare    Authorization Time Period 04/01/16 to 06/21/16   Authorization - Visit Number 4   Authorization - Number of Visits 10   PT Start Time [id number]   PT Stop Time 1630   PT Time Calculation (min) 60 min   Activity Tolerance Patient tolerated treatment well   Behavior During Therapy [facility] for tasks assessed/performed      Past Medical History  Diagnosis Date  . DVT of leg (deep venous thrombosis) (HCC)     rt. leg  . Inferior vena caval thrombosis (HCC)     Chronic  . DVT (deep venous thrombosis) ([place]) 06/15/2011  . H/O PE (pulmonary embolism) 06/15/2011  . 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 Chronic anticoagulation 11/14/2014    Past Surgical History  Procedure Laterality Date  . None to date  10/07/15  . Colonoscopy with propofol N/A 10/29/2015    Procedure: COLONOSCOPY WITH PROPOFOL;  Surgeon: Danie Binder, MD;  [facility];  Service: Endoscopy;  Laterality: N/A;  930    There were no vitals filed for this visit.                  Wound Therapy - 04/14/16 1722    Subjective Pt states he kept his compression on until yesterday and removed it to shower.  Did not bring his bandages back this session.   Patient and Family Stated Goals wounds to heal    Date of Onset --  a couple of weeks ago    Pain Assessment No/denies pain   Evaluation and Treatment Procedures Explained to Patient/Family Yes   Evaluation and Treatment Procedures  agreed to   Wound Properties Date First Assessed: 04/01/16 Wound Type: Other (Comment) , anterior medial L tibia   Location: Tibial Location Orientation: Left;Anterior;Medial Present on Admission: Yes   Dressing Type None  lymph wraps    Dressing Changed New   Dressing Status Old drainage   Dressing Change Frequency PRN   % Wound base Red or Granulating 20%   % Wound base Yellow 80%   Peri-wound Assessment Edema;Other (Comment)  dry skin    Margins Unattached edges (unapproximated)   Closure None   Drainage Amount Minimal   Drainage Description Serosanguineous   Treatment Cleansed;Debridement (Selective)   Wound Properties Date First Assessed: 04/01/16 Wound Type: Other (Comment) , R posterior calf   Location: Tibial Location Orientation: Posterior;Right Wound Description (Comments): posterior R calf  Present on Admission: Yes   Dressing Type None  lymph wraps    Dressing Changed Changed   Dressing Status Old drainage   Dressing Change Frequency PRN   % Wound base Red or Granulating 65%   % Wound base Yellow 35%   Peri-wound Assessment Edema;Other (Comment)  very dry    Margins Unattached edges (unapproximated)   Closure None   Drainage Amount Minimal   Drainage Description Serosanguineous   Treatment Cleansed;Debridement (Selective)   Wound  Properties Date First Assessed: 04/10/16 Time First Assessed: 1532 Wound Type: Other (Comment) Location: Leg Location Orientation: Right;Anterior Wound Description (Comments): Approximately 3x 1.5 on superior anterior aspect.    Dressing Type None   Dressing Changed Changed   Dressing Change Frequency PRN   Site / Wound Assessment Yellow   % Wound base Red or Granulating 50%   % Wound base Yellow 50%   Peri-wound Assessment Induration   Drainage Amount Minimal   Drainage Description Serosanguineous   Treatment Cleansed;Debridement (Selective)   Selective Debridement - Location all wound beds    Selective Debridement - Tools Used Forceps    Selective Debridement - Tissue Removed slough    Wound Therapy - Clinical Statement Pt wtihout dressings today.  Cleansed and debrided, completed manual lymph drainage and rebandaged using xeroform and profore.  LE's moisturized well prior to bandaging.  Overal reduction in drainage today and improved granulation.  Instructed pt to keep bandages intact until next session.    Wound Therapy - Functional Problem List non healing wounds    Factors Delaying/Impairing Wound Healing Altered sensation;Vascular compromise;Other (comment)  lymphedema    Hydrotherapy Plan Debridement;Dressing change  manual lymphatic drainage to B LE; measure Rt ant. wound   Wound Therapy - Frequency Other (comment)  2x/week    Wound Therapy - Current Recommendations PT   Wound Plan xeroform, vaseline, profore                    PT Short Term Goals - 04/01/16 1459    PT SHORT TERM GOAL #1   Title Patient to present with 0% slough on all wounds and 100% red granulation tissue to demonstrate overall improvement of condition    Time 4   Period Weeks   Status New   PT SHORT TERM GOAL #2   Title Patient to demonstarte improved moisture in tissues surrounding wounds in order to facilitate good healing process    Time 4   Period Weeks   Status New   PT SHORT TERM GOAL #3   Title All wound dimensions to have decreased by at least 40% in order to demonstarte improved condition    Time 4   Period Weeks   Status New           PT Long Term Goals - 04/01/16 1503    PT LONG TERM GOAL #1   Title Patient to demonstrate full wound healing in all wounds   Time 8   Period Weeks   Status New   PT LONG TERM GOAL #2   Title Patient to be wearing compression at all times to assist in preventing recurrence of wounds in face of lymphedema co-morbidity    Time 8   Period Weeks   Status New             Patient will benefit from skilled therapeutic intervention in order to improve the following  deficits and impairments:     Visit Diagnosis: 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 Encounter for screening colonoscopy 10/07/2015  . Cellulitis of right lower extremity   . UTI (lower urinary tract infection)  02/03/2015  . Cellulitis 02/03/2015  . Blepharitis 02/03/2015  . Chronic anticoagulation 11/14/2014  . 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 Leg wound, left 08/19/2011  . DVT (deep venous thrombosis) ([place]) 06/15/2011  . H/O PE (pulmonary embolism) 06/15/2011  . 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

## 2016-04-16 ENCOUNTER — Ambulatory Visit (HOSPITAL_COMMUNITY): Payer: Medicare Other

## 2016-04-16 ENCOUNTER — Ambulatory Visit (HOSPITAL_COMMUNITY): Payer: Medicare Other | Admitting: Physical Therapy

## 2016-04-16 DIAGNOSIS — I83012 Varicose veins of right lower extremity with ulcer of calf: Secondary | ICD-10-CM

## 2016-04-16 DIAGNOSIS — I83018 Varicose veins of right lower extremity with ulcer other part of lower leg: Secondary | ICD-10-CM | POA: Diagnosis not present

## 2016-04-16 DIAGNOSIS — I83023 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: Secondary | ICD-10-CM

## 2016-04-16 DIAGNOSIS — L97819 Non-pressure chronic ulcer of other part of right lower leg with unspecified severity: Secondary | ICD-10-CM

## 2016-04-16 DIAGNOSIS — S81801S Unspecified open wound, right lower leg, sequela: Secondary | ICD-10-CM | POA: Diagnosis not present

## 2016-04-16 DIAGNOSIS — M79661 Pain in right lower leg: Secondary | ICD-10-CM | POA: Diagnosis not present

## 2016-04-16 DIAGNOSIS — L97219 Non-pressure chronic ulcer of right calf with unspecified severity: Principal | ICD-10-CM

## 2016-04-16 DIAGNOSIS — L97309 Non-pressure chronic ulcer of unspecified ankle with unspecified severity: Secondary | ICD-10-CM

## 2016-04-16 DIAGNOSIS — S81802S Unspecified open wound, left lower leg, sequela: Secondary | ICD-10-CM | POA: Diagnosis not present

## 2016-04-16 NOTE — Therapy (Signed)
[place], [place], [address] Phone: [phone]   Fax:  [phone]  Wound Care Therapy  Patient Details  Name: Matthew Wise MRN: [id number] Date of Birth: 1960/12/30 No Data Recorded  Encounter Date: 04/16/2016      PT End of Session - 04/16/16 1607    Visit Number 5   Number of Visits 16   Date for PT Re-Evaluation 04/29/16   Authorization Type Medicare    Authorization Time Period 04/01/16 to 06/21/16   Authorization - Visit Number 5   Authorization - Number of Visits 10   PT Start Time 1430   PT Stop Time 1535   PT Time Calculation (min) 65 min   Activity Tolerance Patient tolerated treatment well   Behavior During Therapy [facility] for tasks assessed/performed      Past Medical History  Diagnosis Date  . DVT of leg (deep venous thrombosis) (HCC)     rt. leg  . Inferior vena caval thrombosis (HCC)     Chronic  . DVT (deep venous thrombosis) ([place]) 06/15/2011  . H/O PE (pulmonary embolism) 06/15/2011  . 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 Chronic anticoagulation 11/14/2014    Past Surgical History  Procedure Laterality Date  . None to date  10/07/15  . Colonoscopy with propofol N/A 10/29/2015    Procedure: COLONOSCOPY WITH PROPOFOL;  Surgeon: Danie Binder, MD;  [facility];  Service: Endoscopy;  Laterality: N/A;  930    There were no vitals filed for this visit.                  Wound Therapy - 04/16/16 1558    Subjective Pt states that his short stretch bandages are dirty.  He will clean them and bring them in next treatment.    Patient and Family Stated Goals wounds to heal    Pain Assessment No/denies pain   Pain Score 0-No pain   Evaluation and Treatment Procedures Explained to Patient/Family Yes   Evaluation and Treatment Procedures agreed to   Wound Properties Date First  Assessed: 04/10/16 Time First Assessed: 1532 Wound Type: Other (Comment) Location: Leg Location Orientation: Right;Anterior Wound Description (Comments): Approximately 3x 1.5 on superior anterior aspect.    Dressing Type Compression wrap   Dressing Changed New   Dressing Change Frequency PRN   Site / Wound Assessment Granulation tissue   % Wound base Red or Granulating 90%   % Wound base Yellow 10%   Peri-wound Assessment Induration;Hemosiderin   Drainage Amount Minimal   Drainage Description Serous   Treatment Cleansed;Other (Comment)  Manual lymph drainage and compression bandaging    Wound Properties Date First Assessed: 04/01/16 Wound Type: Other (Comment) , anterior medial L tibia   Location: Tibial Location Orientation: Left;Anterior;Medial Present on Admission: Yes   Dressing Type Compression wrap   Dressing Changed Changed   Dressing Status Old drainage   Dressing Change Frequency PRN   % Wound base Red or Granulating 90%   % Wound base Yellow 10%   Peri-wound Assessment Edema;Hemosiderin   Closure None   Treatment Cleansed;Other (Comment)  totals decongestive therapy    Wound Properties Date First Assessed: 04/01/16 Wound Type: Other (Comment) , R posterior calf   Location: Tibial Location Orientation: Posterior;Right Wound Description (Comments): posterior R calf  Present on Admission: Yes   Dressing Type Compression wrap  Dressing Changed Changed   Dressing Status Old drainage   Dressing Change Frequency PRN   % Wound base Red or Granulating 90%   % Wound base Yellow 10%   Peri-wound Assessment Edema;Hemosiderin   Closure None   Treatment Cleansed;Other (Comment)  Total decongestive drainage    Wound Therapy - Clinical Statement Pt recieved manual lymph drainage both anteriorly and posteriorly including supraclavicular, deep and superfical abdominal, inguinal/axillary anastomosis and bilateral LE. Pt recieved compression dressing using profore dressing system with extra  cotton as well as ABpads to create more cone like shape .   Wound Therapy - Functional Problem List non healing wounds    Factors Delaying/Impairing Wound Healing Altered sensation;Diabetes Mellitus;Infection - systemic/local;Multiple medical problems;Vascular compromise   Hydrotherapy Plan Other (comment);Dressing change  complete decongestive therapy; aquire home pump    Wound Therapy - Frequency Other (comment)   Wound Therapy - Current Recommendations PT   Wound Plan decongestive therapy followed by dressing xeroform, vaseline, profore                    PT Short Term Goals - 04/01/16 1459    PT SHORT TERM GOAL #1   Title Patient to present with 0% slough on all wounds and 100% red granulation tissue to demonstrate overall improvement of condition    Time 4   Period Weeks   Status New   PT SHORT TERM GOAL #2   Title Patient to demonstarte improved moisture in tissues surrounding wounds in order to facilitate good healing process    Time 4   Period Weeks   Status New   PT SHORT TERM GOAL #3   Title All wound dimensions to have decreased by at least 40% in order to demonstarte improved condition    Time 4   Period Weeks   Status New           PT Long Term Goals - 04/01/16 1503    PT LONG TERM GOAL #1   Title Patient to demonstrate full wound healing in all wounds   Time 8   Period Weeks   Status New   PT LONG TERM GOAL #2   Title Patient to be wearing compression at all times to assist in preventing recurrence of wounds in face of lymphedema co-morbidity    Time 8   Period Weeks   Status New             Patient will benefit from skilled therapeutic intervention in order to improve the following deficits and impairments:     Visit Diagnosis: 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 Encounter for screening colonoscopy 10/07/2015  . Cellulitis of right lower extremity   . UTI (lower urinary tract infection) 02/03/2015  . Cellulitis 02/03/2015  . Blepharitis 02/03/2015  . Chronic anticoagulation 11/14/2014  . 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 Leg wound, left 08/19/2011  . DVT (deep venous thrombosis) ([place]) 06/15/2011  . H/O PE (pulmonary embolism) 06/15/2011  . 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

## 2016-04-17 ENCOUNTER — Ambulatory Visit (HOSPITAL_COMMUNITY): Payer: Medicare Other

## 2016-04-20 ENCOUNTER — Ambulatory Visit (HOSPITAL_COMMUNITY): Payer: Medicare Other | Admitting: Physical Therapy

## 2016-04-20 DIAGNOSIS — I83018 Varicose veins of right lower extremity with ulcer other part of lower leg: Secondary | ICD-10-CM

## 2016-04-20 DIAGNOSIS — I83023 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: Secondary | ICD-10-CM

## 2016-04-20 DIAGNOSIS — L97309 Non-pressure chronic ulcer of unspecified ankle with unspecified severity: Secondary | ICD-10-CM

## 2016-04-20 DIAGNOSIS — L97819 Non-pressure chronic ulcer of other part of right lower leg with unspecified severity: Secondary | ICD-10-CM

## 2016-04-20 DIAGNOSIS — S81802S Unspecified open wound, left lower leg, sequela: Secondary | ICD-10-CM

## 2016-04-20 DIAGNOSIS — M79661 Pain in right lower leg: Secondary | ICD-10-CM | POA: Diagnosis not present

## 2016-04-20 DIAGNOSIS — S81801S Unspecified open wound, right lower leg, sequela: Secondary | ICD-10-CM | POA: Diagnosis not present

## 2016-04-20 DIAGNOSIS — I83012 Varicose veins of right lower extremity with ulcer of calf: Secondary | ICD-10-CM

## 2016-04-20 DIAGNOSIS — L97329 Non-pressure chronic ulcer of left ankle with unspecified severity: Secondary | ICD-10-CM

## 2016-04-20 DIAGNOSIS — L97219 Non-pressure chronic ulcer of right calf with unspecified severity: Principal | ICD-10-CM

## 2016-04-20 NOTE — Therapy (Signed)
[place], [place], [address] Phone: [phone]   Fax:  [phone]  Wound Care Therapy  Patient Details  Name: Matthew Wise MRN: [id number] Date of Birth: 09/14/1961 No Data Recorded  Encounter Date: 04/20/2016      PT End of Session - 04/20/16 1625    Visit Number 6   Number of Visits 16   Date for PT Re-Evaluation 04/29/16   Authorization Type Medicare    Authorization Time Period 04/01/16 to 06/21/16   Authorization - Visit Number 6   Authorization - Number of Visits 10   PT Start Time 1440   PT Stop Time 1545   PT Time Calculation (min) 65 min   Activity Tolerance Patient tolerated treatment well   Behavior During Therapy [facility] for tasks assessed/performed      Past Medical History  Diagnosis Date  . DVT of leg (deep venous thrombosis) (HCC)     rt. leg  . Inferior vena caval thrombosis (HCC)     Chronic  . DVT (deep venous thrombosis) ([place]) 06/15/2011  . H/O PE (pulmonary embolism) 06/15/2011  . 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 Chronic anticoagulation 11/14/2014    Past Surgical History  Procedure Laterality Date  . None to date  10/07/15  . Colonoscopy with propofol N/A 10/29/2015    Procedure: COLONOSCOPY WITH PROPOFOL;  Surgeon: Danie Binder, MD;  [facility];  Service: Endoscopy;  Laterality: N/A;  930    There were no vitals filed for this visit.       Subjective Assessment - 04/20/16 1620    Subjective PT states his legs have been hurting more this time than in the past.  Unsure why.  States he kept his bandages on until yesterday.                   Wound Therapy - 04/20/16 1621    Subjective Pt states his legs are hurting worse this time than they have previously.  States he kept his bandages on until yesterday.     Patient and Family Stated Goals wounds to heal     Pain Assessment No/denies pain   Pain Score 4    Pain Location Leg  bilaterally   Pain Orientation Right;Left   Pain Descriptors / Indicators Throbbing   Pain Onset --  at rest   Patients Stated Pain Goal 0   Evaluation and Treatment Procedures Explained to Patient/Family Yes   Evaluation and Treatment Procedures agreed to   Wound Properties Date First Assessed: 04/10/16 Time First Assessed: 1532 Wound Type: Other (Comment) Location: Leg Location Orientation: Right;Anterior Wound Description (Comments): Approximately 3x 1.5 on superior anterior aspect.    Dressing Type Compression wrap   Dressing Changed New   Dressing Status None   Dressing Change Frequency PRN   Site / Wound Assessment Granulation tissue   % Wound base Red or Granulating 90%   % Wound base Yellow 10%   Peri-wound Assessment Induration;Hemosiderin   Drainage Amount Minimal   Drainage Description Serous   Treatment Cleansed;Debridement (Selective)   Wound Properties Date First Assessed: 04/01/16 Wound Type: Other (Comment) , anterior medial L tibia   Location: Tibial Location Orientation: Left;Anterior;Medial Present on Admission: Yes   Dressing Type Compression wrap   Dressing Changed Changed   Dressing Status None   Dressing Change Frequency PRN   %  Wound base Red or Granulating 90%   % Wound base Yellow 10%   Peri-wound Assessment Edema;Hemosiderin   Closure None   Wound Properties Date First Assessed: 04/01/16 Wound Type: Other (Comment) , R posterior calf   Location: Tibial Location Orientation: Posterior;Right Wound Description (Comments): posterior R calf  Present on Admission: Yes   Dressing Type Compression wrap   Dressing Changed Changed   Dressing Status None   Dressing Change Frequency PRN   % Wound base Red or Granulating 90%   % Wound base Yellow 10%   Peri-wound Assessment Edema;Hemosiderin   Closure None   Treatment Cleansed;Debridement (Selective)   Wound Therapy - Clinical Statement  Wounds looking better with ability to remove more dry skin to promote healthier tissue and decreased risk of more wounds.  PT more sensitive to debridement than in past and reports more pain ,especially in the evenings when resting, describing as an ache or throb. Completed manual lymph drainage for bilateral LE's this session followed by deep moisturizing of LE's prior to redressing with xeroform to open wounds and profore for compression.  Reminded to bring short stretch next session .    Wound Therapy - Functional Problem List non healing wounds    Factors Delaying/Impairing Wound Healing Altered sensation;Diabetes Mellitus;Infection - systemic/local;Multiple medical problems;Vascular compromise   Hydrotherapy Plan Other (comment);Dressing change  complete decongestive therapy; aquire home pump    Wound Therapy - Frequency Other (comment)   Wound Therapy - Current Recommendations PT   Wound Plan Wound irrigation and debridment followed by decongestive therapy and appropriate dressing with compression. Follow up on compression pump.                   PT Short Term Goals - 04/20/16 1626    PT SHORT TERM GOAL #1   Title Patient to present with 0% slough on all wounds and 100% red granulation tissue to demonstrate overall improvement of condition    Time 4   Period Weeks   Status On-going   PT SHORT TERM GOAL #2   Title Patient to demonstarte improved moisture in tissues surrounding wounds in order to facilitate good healing process    Time 4   Period Weeks   Status On-going   PT SHORT TERM GOAL #3   Title All wound dimensions to have decreased by at least 40% in order to demonstarte improved condition    Time 4   Period Weeks   Status On-going           PT Long Term Goals - 04/20/16 1626    PT LONG TERM GOAL #1   Title Patient to demonstrate full wound healing in all wounds   Time 8   Period Weeks   Status On-going   PT LONG TERM GOAL #2   Title Patient to be  wearing compression at all times to assist in preventing recurrence of wounds in face of lymphedema co-morbidity    Time 8   Period Weeks   Status On-going             Patient will benefit from skilled therapeutic intervention in order to improve the following deficits and impairments:     Visit Diagnosis: 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 Encounter for screening colonoscopy 10/07/2015  . Cellulitis of right lower extremity   . UTI (lower urinary tract infection) 02/03/2015  . Cellulitis 02/03/2015  . Blepharitis 02/03/2015  . Chronic anticoagulation 11/14/2014  . 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 Leg wound, left 08/19/2011  . DVT (deep venous thrombosis) ([place]) 06/15/2011  . H/O PE (pulmonary embolism) 06/15/2011  . 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

## 2016-04-21 ENCOUNTER — Ambulatory Visit (HOSPITAL_COMMUNITY): Payer: Medicare Other

## 2016-04-22 ENCOUNTER — Ambulatory Visit (HOSPITAL_COMMUNITY): Payer: Medicare Other | Admitting: Physical Therapy

## 2016-04-22 DIAGNOSIS — S81801S Unspecified open wound, right lower leg, sequela: Secondary | ICD-10-CM

## 2016-04-22 DIAGNOSIS — M79661 Pain in right lower leg: Secondary | ICD-10-CM | POA: Diagnosis not present

## 2016-04-22 DIAGNOSIS — I83012 Varicose veins of right lower extremity with ulcer of calf: Secondary | ICD-10-CM | POA: Diagnosis not present

## 2016-04-22 DIAGNOSIS — L97219 Non-pressure chronic ulcer of right calf with unspecified severity: Principal | ICD-10-CM

## 2016-04-22 DIAGNOSIS — I83018 Varicose veins of right lower extremity with ulcer other part of lower leg: Secondary | ICD-10-CM | POA: Diagnosis not present

## 2016-04-22 DIAGNOSIS — S81802S Unspecified open wound, left lower leg, sequela: Secondary | ICD-10-CM

## 2016-04-22 DIAGNOSIS — I83023 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: Secondary | ICD-10-CM

## 2016-04-22 DIAGNOSIS — L97309 Non-pressure chronic ulcer of unspecified ankle with unspecified severity: Secondary | ICD-10-CM

## 2016-04-22 NOTE — Therapy (Addendum)
[place], [place], [address] Phone: [phone]   Fax:  [phone]  Wound Care Therapy  Patient Details  Name: Matthew Wise MRN: [id number] Date of Birth: February 04, 1961 No Data Recorded  Encounter Date: 04/22/2016      PT End of Session - 04/22/16 1706    Visit Number 7   Number of Visits 16   Date for PT Re-Evaluation 04/29/16   Authorization Type Medicare    Authorization Time Period 04/01/16 to 06/21/16   Authorization - Visit Number 7   Authorization - Number of Visits 10   PT Start Time [id number]   PT Stop Time 1620   PT Time Calculation (min) 65 min   Activity Tolerance Patient tolerated treatment well   Behavior During Therapy [facility] for tasks assessed/performed      Past Medical History  Diagnosis Date  . DVT of leg (deep venous thrombosis) (HCC)     rt. leg  . Inferior vena caval thrombosis (HCC)     Chronic  . DVT (deep venous thrombosis) ([place]) 06/15/2011  . H/O PE (pulmonary embolism) 06/15/2011  . 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 Chronic anticoagulation 11/14/2014    Past Surgical History  Procedure Laterality Date  . None to date  10/07/15  . Colonoscopy with propofol N/A 10/29/2015    Procedure: COLONOSCOPY WITH PROPOFOL;  Surgeon: Danie Binder, MD;  [facility];  Service: Endoscopy;  Laterality: N/A;  930    There were no vitals filed for this visit.                  Wound Therapy - 04/22/16 1649    Subjective Pt states his legs are still sore, especially posteriorly.  States he removed the outer 2 bandages about 3am this morning because his legs were hurting.   Patient and Family Stated Goals wounds to heal    Pain Assessment No/denies pain   Wound Properties Date First Assessed: 04/10/16 Time First Assessed: 1532 Wound Type: Other (Comment) Location: Leg Location  Orientation: Right;Anterior Wound Description (Comments): Approximately 3x 1.5 on superior anterior aspect.    Dressing Type Gauze (Comment)  with stockings over   Dressing Changed Changed   Dressing Status None   Dressing Change Frequency PRN   Site / Wound Assessment Granulation tissue   % Wound base Red or Granulating 90%   % Wound base Yellow 10%   Peri-wound Assessment Induration   Drainage Amount Minimal   Drainage Description Serous   Treatment Cleansed;Debridement (Selective)   Wound Properties Date First Assessed: 04/01/16 Wound Type: Other (Comment) , anterior medial L tibia   Location: Tibial Location Orientation: Left;Anterior;Medial Present on Admission: Yes   Dressing Type Compression wrap;Gauze (Comment)   Dressing Changed Changed   Dressing Status None   Dressing Change Frequency PRN   % Wound base Red or Granulating 90%   % Wound base Yellow 10%   Peri-wound Assessment Edema   Closure None   Treatment Cleansed;Debridement (Selective)   Wound Properties Date First Assessed: 04/01/16 Wound Type: Other (Comment) , R posterior calf   Location: Tibial Location Orientation: Posterior;Right Wound Description (Comments): posterior R calf  Present on Admission: Yes   Dressing Type Compression wrap   Dressing Changed Changed   Dressing Status None   Dressing Change Frequency PRN   % Wound base Red or Granulating  90%   % Wound base Yellow 10%   Peri-wound Assessment Edema   Closure None   Treatment Cleansed;Debridement (Selective)   Selective Debridement - Location all wound beds    Selective Debridement - Tools Used Forceps   Selective Debridement - Tissue Removed slough, dry skin    Wound Therapy - Clinical Statement Pt received manual lymph drainage.  LE's moisturized well prior to rebandaging.  Overall reduction of induration in LE's with most still aroung Lt anterior-medial wound and Rt posterior wound.  wounds overall look better with decreased scaliness of skin around  perimeter.  Less dry skin and slough to debride this session.  Continued with xeroform and profore dressings.    Wound Therapy - Functional Problem List non healing wounds    Factors Delaying/Impairing Wound Healing Altered sensation;Diabetes Mellitus;Infection - systemic/local;Multiple medical problems;Vascular compromise   Hydrotherapy Plan Other (comment);Dressing change  complete decongestive therapy; aquire home pump    Wound Therapy - Frequency Other (comment)   Wound Therapy - Current Recommendations PT   Wound Plan decongestive therapy followed by dressing xeroform, vaseline, profore                    PT Short Term Goals - 04/20/16 1626    PT SHORT TERM GOAL #1   Title Patient to present with 0% slough on all wounds and 100% red granulation tissue to demonstrate overall improvement of condition    Time 4   Period Weeks   Status On-going   PT SHORT TERM GOAL #2   Title Patient to demonstarte improved moisture in tissues surrounding wounds in order to facilitate good healing process    Time 4   Period Weeks   Status On-going   PT SHORT TERM GOAL #3   Title All wound dimensions to have decreased by at least 40% in order to demonstarte improved condition    Time 4   Period Weeks   Status On-going           PT Long Term Goals - 04/20/16 1626    PT LONG TERM GOAL #1   Title Patient to demonstrate full wound healing in all wounds   Time 8   Period Weeks   Status On-going   PT LONG TERM GOAL #2   Title Patient to be wearing compression at all times to assist in preventing recurrence of wounds in face of lymphedema co-morbidity    Time 8   Period Weeks   Status On-going             Patient will benefit from skilled therapeutic intervention in order to improve the following deficits and impairments:     Visit Diagnosis: 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 Encounter for screening colonoscopy 10/07/2015  . Cellulitis of right lower extremity   . UTI (lower urinary tract infection) 02/03/2015  . Cellulitis 02/03/2015  . Blepharitis 02/03/2015  . Chronic anticoagulation 11/14/2014  . 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 Leg wound, left 08/19/2011  . DVT (deep venous thrombosis) ([place]) 06/15/2011  . H/O PE (pulmonary embolism) 06/15/2011  . 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 [place], [place], [address] Phone: [phone]   Fax:  [phone]  Name: KELON CONKIN MRN: [id number] Date of Birth: [birth year]

## 2016-04-23 ENCOUNTER — Ambulatory Visit (HOSPITAL_COMMUNITY): Payer: Medicare Other

## 2016-04-28 ENCOUNTER — Ambulatory Visit (HOSPITAL_COMMUNITY): Payer: Medicare Other

## 2016-04-29 ENCOUNTER — Ambulatory Visit (HOSPITAL_COMMUNITY): Payer: Medicare Other | Admitting: Physical Therapy

## 2016-04-29 DIAGNOSIS — S81802S Unspecified open wound, left lower leg, sequela: Secondary | ICD-10-CM

## 2016-04-29 DIAGNOSIS — I83018 Varicose veins of right lower extremity with ulcer other part of lower leg: Secondary | ICD-10-CM | POA: Diagnosis not present

## 2016-04-29 DIAGNOSIS — I83023 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: Secondary | ICD-10-CM

## 2016-04-29 DIAGNOSIS — I83012 Varicose veins of right lower extremity with ulcer of calf: Secondary | ICD-10-CM

## 2016-04-29 DIAGNOSIS — S81801S Unspecified open wound, right lower leg, sequela: Secondary | ICD-10-CM | POA: Diagnosis not present

## 2016-04-29 DIAGNOSIS — L97309 Non-pressure chronic ulcer of unspecified ankle with unspecified severity: Secondary | ICD-10-CM

## 2016-04-29 DIAGNOSIS — L97219 Non-pressure chronic ulcer of right calf with unspecified severity: Principal | ICD-10-CM

## 2016-04-29 DIAGNOSIS — M79661 Pain in right lower leg: Secondary | ICD-10-CM | POA: Diagnosis not present

## 2016-04-29 NOTE — Therapy (Signed)
[place], [place], [address] Phone: [phone]   Fax:  [phone]  Wound Care Therapy  Patient Details  Name: Matthew Wise MRN: [id number] Date of Birth: June 21, 1961 No Data Recorded  Encounter Date: 04/29/2016      PT End of Session - 04/29/16 1725    Visit Number 8   Number of Visits 16   Date for PT Re-Evaluation 04/29/16   Authorization Type Medicare    Authorization Time Period 04/01/16 to 06/21/16   Authorization - Visit Number 8   Authorization - Number of Visits 10   PT Start Time 1532   PT Stop Time 1640   PT Time Calculation (min) 68 min   Activity Tolerance Patient tolerated treatment well   Behavior During Therapy [facility] for tasks assessed/performed      Past Medical History  Diagnosis Date  . DVT of leg (deep venous thrombosis) (HCC)     rt. leg  . Inferior vena caval thrombosis (HCC)     Chronic  . DVT (deep venous thrombosis) ([place]) 06/15/2011  . H/O PE (pulmonary embolism) 06/15/2011  . 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 Chronic anticoagulation 11/14/2014    Past Surgical History  Procedure Laterality Date  . None to date  10/07/15  . Colonoscopy with propofol N/A 10/29/2015    Procedure: COLONOSCOPY WITH PROPOFOL;  Surgeon: Danie Binder, MD;  [facility];  Service: Endoscopy;  Laterality: N/A;  930    There were no vitals filed for this visit.                  Wound Therapy - 04/29/16 1722    Subjective Pt states his legs hurt him when at rest but OK when he's moving and with compression.     Patient and Family Stated Goals wounds to heal    Pain Assessment No/denies pain   Wound Properties Date First Assessed: 04/10/16 Time First Assessed: 1532 Wound Type: Other (Comment) Location: Leg Location Orientation: Right;Anterior Wound Description (Comments):  Approximately 3x 1.5 on superior anterior aspect.    Dressing Type Gauze (Comment)  with stockings over   Dressing Changed Changed   Dressing Status None   Dressing Change Frequency PRN   Site / Wound Assessment Granulation tissue   % Wound base Red or Granulating 90%   % Wound base Yellow 10%   Peri-wound Assessment Induration   Drainage Amount Scant   Drainage Description Serosanguineous   Treatment Cleansed;Debridement (Selective)   Wound Properties Date First Assessed: 04/01/16 Wound Type: Other (Comment) , anterior medial L tibia   Location: Tibial Location Orientation: Left;Anterior;Medial Present on Admission: Yes   Dressing Type Compression wrap;Gauze (Comment)   Dressing Changed Changed   Dressing Status None   Dressing Change Frequency PRN   % Wound base Red or Granulating 90%   % Wound base Yellow 10%   Peri-wound Assessment Edema   Closure None   Drainage Amount Minimal   Drainage Description Serosanguineous   Treatment Cleansed;Debridement (Selective)   Wound Properties Date First Assessed: 04/01/16 Wound Type: Other (Comment) , R posterior calf   Location: Tibial Location Orientation: Posterior;Right Wound Description (Comments): posterior R calf  Present on Admission: Yes   Dressing Type Compression wrap   Dressing Changed Changed   Dressing Status None   Dressing Change Frequency PRN   % Wound base  Red or Granulating 90%   % Wound base Yellow 10%   Peri-wound Assessment Induration;Erythema (blanchable)   Closure None   Drainage Amount Moderate   Drainage Description Purulent;Serosanguineous   Treatment Cleansed;Debridement (Selective)   Selective Debridement - Location all wound beds    Selective Debridement - Tools Used Forceps   Selective Debridement - Tissue Removed slough, dry skin    Wound Therapy - Clinical Statement Continued with manual lymph drainage.  Posterior Rt LE with increased odor and drainage this session.  Rt LE is also larger than Lt LE,  however patient reports Lt LE bothers him more.  Changed dressing to silver hydrofiber for posterior wound.  Continued with xeroform for all other wounds.   Debrided more dry skin and scaliness from perimeter of wounds.   LE's moisturized well prior to rebandaging. Used profore dressings this session and requested pt bring his short stretch next session to begin using on Rt LE to help heal posterior wound and reduce swelling.     Wound Therapy - Functional Problem List non healing wounds    Factors Delaying/Impairing Wound Healing Altered sensation;Diabetes Mellitus;Infection - systemic/local;Multiple medical problems;Vascular compromise   Hydrotherapy Plan Other (comment);Dressing change  complete decongestive therapy; aquire home pump    Wound Therapy - Frequency Other (comment)   Wound Therapy - Current Recommendations PT   Wound Plan decongestive therapy followed by appropriate dressings and compression.  Begin short stretch on Rt LE next session and profore on LT LE.                   PT Short Term Goals - 04/29/16 1726    PT SHORT TERM GOAL #1   Title Patient to present with 0% slough on all wounds and 100% red granulation tissue to demonstrate overall improvement of condition    Time 4   Period Weeks   Status On-going   PT SHORT TERM GOAL #2   Title Patient to demonstarte improved moisture in tissues surrounding wounds in order to facilitate good healing process    Time 4   Period Weeks   Status On-going   PT SHORT TERM GOAL #3   Title All wound dimensions to have decreased by at least 40% in order to demonstarte improved condition    Time 4   Period Weeks   Status On-going           PT Long Term Goals - 04/20/16 1626    PT LONG TERM GOAL #1   Title Patient to demonstrate full wound healing in all wounds   Time 8   Period Weeks   Status On-going   PT LONG TERM GOAL #2   Title Patient to be wearing compression at all times to assist in preventing recurrence of  wounds in face of lymphedema co-morbidity    Time 8   Period Weeks   Status On-going             Patient will benefit from skilled therapeutic intervention in order to improve the following deficits and impairments:     Visit Diagnosis: 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 Encounter for screening colonoscopy 10/07/2015  . Cellulitis of right lower extremity   .  UTI (lower urinary tract infection) 02/03/2015  . Cellulitis 02/03/2015  . Blepharitis 02/03/2015  . Chronic anticoagulation 11/14/2014  . 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 Leg wound, left 08/19/2011  . DVT (deep venous thrombosis) ([place]) 06/15/2011  . H/O PE (pulmonary embolism) 06/15/2011  . 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

## 2016-04-30 ENCOUNTER — Ambulatory Visit (HOSPITAL_COMMUNITY): Payer: Medicare Other | Admitting: Physical Therapy

## 2016-05-01 ENCOUNTER — Other Ambulatory Visit (HOSPITAL_COMMUNITY)
Admission: RE | Admit: 2016-05-01 | Discharge: 2016-05-01 | Disposition: A | Discharge status: Home / Self Care | Payer: Medicare Other | Source: Other Acute Inpatient Hospital | Attending: Urology | Admitting: Urology

## 2016-05-01 ENCOUNTER — Ambulatory Visit (INDEPENDENT_AMBULATORY_CARE_PROVIDER_SITE_OTHER): Payer: Medicare Other | Admitting: Urology

## 2016-05-01 DIAGNOSIS — R8299 Other abnormal findings in urine: Secondary | ICD-10-CM | POA: Diagnosis not present

## 2016-05-01 DIAGNOSIS — R310 Gross hematuria: Secondary | ICD-10-CM | POA: Insufficient documentation

## 2016-05-04 ENCOUNTER — Telehealth (HOSPITAL_COMMUNITY): Payer: Self-pay

## 2016-05-04 ENCOUNTER — Ambulatory Visit (HOSPITAL_COMMUNITY): Payer: Medicare Other | Admitting: Physical Therapy

## 2016-05-04 NOTE — Telephone Encounter (Signed)
05/04/16 cx - said he wouldn't be here today

## 2016-05-07 ENCOUNTER — Ambulatory Visit (HOSPITAL_COMMUNITY): Payer: Medicare Other | Admitting: Physical Therapy

## 2016-05-07 ENCOUNTER — Ambulatory Visit (HOSPITAL_COMMUNITY): Payer: Medicare Other | Attending: Hematology & Oncology | Admitting: Physical Therapy

## 2016-05-07 DIAGNOSIS — X58XXXS Exposure to other specified factors, sequela: Secondary | ICD-10-CM | POA: Insufficient documentation

## 2016-05-07 DIAGNOSIS — L97219 Non-pressure chronic ulcer of right calf with unspecified severity: Secondary | ICD-10-CM

## 2016-05-07 DIAGNOSIS — S81802S Unspecified open wound, left lower leg, sequela: Secondary | ICD-10-CM | POA: Insufficient documentation

## 2016-05-07 DIAGNOSIS — L97329 Non-pressure chronic ulcer of left ankle with unspecified severity: Secondary | ICD-10-CM

## 2016-05-07 DIAGNOSIS — S81801S Unspecified open wound, right lower leg, sequela: Secondary | ICD-10-CM | POA: Diagnosis not present

## 2016-05-07 DIAGNOSIS — M79661 Pain in right lower leg: Secondary | ICD-10-CM | POA: Diagnosis not present

## 2016-05-07 DIAGNOSIS — I83012 Varicose veins of right lower extremity with ulcer of calf: Secondary | ICD-10-CM

## 2016-05-07 DIAGNOSIS — L97309 Non-pressure chronic ulcer of unspecified ankle with unspecified severity: Secondary | ICD-10-CM

## 2016-05-07 DIAGNOSIS — I83023 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: Secondary | ICD-10-CM

## 2016-05-07 NOTE — Therapy (Addendum)
[place], [place], [address] Phone: [phone]   Fax:  [phone]  Wound Care Therapy  Patient Details  Name: Matthew Wise MRN: [id number] Date of Birth: 1961-11-08 No Data Recorded  Encounter Date: 05/07/2016 Reassess      PT End of Session - 05/07/16 1602    Visit Number 9   Number of Visits 16   Date for PT Re-Evaluation 06/06/2016   Authorization Type Medicare    Authorization Time Period 04/01/16 to 06/21/16   Authorization - Visit Number 9  G: code and reassess done on visit 9    Authorization - Number of Visits 16   PT Start Time 1430   PT Stop Time 1534   PT Time Calculation (min) 64 min   Activity Tolerance Patient tolerated treatment well   Behavior During Therapy [facility] for tasks assessed/performed      Past Medical History  Diagnosis Date  . DVT of leg (deep venous thrombosis) (HCC)     rt. leg  . Inferior vena caval thrombosis (HCC)     Chronic  . DVT (deep venous thrombosis) ([place]) 06/15/2011  . H/O PE (pulmonary embolism) 06/15/2011  . 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 Chronic anticoagulation 11/14/2014    Past Surgical History  Procedure Laterality Date  . None to date  10/07/15  . Colonoscopy with propofol N/A 10/29/2015    Procedure: COLONOSCOPY WITH PROPOFOL;  Surgeon: Danie Binder, MD;  [facility];  Service: Endoscopy;  Laterality: N/A;  930    There were no vitals filed for this visit.                  Wound Therapy - 05/07/16 1542    Subjective Pt states he is having no pain.       Patient and Family Stated Goals wounds to heal    Pain Assessment No/denies pain   Wound Properties Date First Assessed: 04/10/16 Time First Assessed: 1532 Wound Type: Other (Comment) Location: Leg Location Orientation: Right;Anterior Wound Description (Comments): Approximately 3x  1.5 on superior anterior aspect.  Final Assessment Date: 05/07/16 Final Assessment Time: 1500:  This wound is healed    Wound Properties Date First Assessed: 04/01/16 Wound Type: Other (Comment) , anterior medial L tibia   Location: Tibial Location Orientation: Left;Anterior;Medial Present on Admission: Yes   Dressing Type Compression wrap;Impregnated gauze (bismuth)   Dressing Changed Changed   Dressing Status None   Dressing Change Frequency PRN   % Wound base Red or Granulating 95%  was 50%    % Wound base Yellow 5%   Peri-wound Assessment Edema;Induration   Wound Length (cm) --  5 small scattered wounds was 3x2 cm    Closure None   Drainage Amount Scant   Drainage Description Serosanguineous   Treatment Cleansed;Other (Comment)   Wound Properties Date First Assessed: 04/01/16 Wound Type: Other (Comment) , R posterior calf   Location: Tibial Location Orientation: Posterior;Right Wound Description (Comments): posterior R calf  Present on Admission: Yes   Dressing Type Compression wrap;Silver hydrofiber   Dressing Changed Changed   Dressing Status None   Dressing Change Frequency PRN   % Wound base Red or Granulating 95%  was 50%    % Wound base Yellow 5%   Peri-wound Assessment Induration;Erythema (blanchable)   Wound Length (cm) --  was 1.2 x 1.8  with fragile skin surrounding area. ; now:  2 wounds:  1) 3.5x1.0x.3; 2nd 2.0x 1.0 x .3   Closure None   Drainage Amount Moderate   Drainage Description Purulent;Serosanguineous   Treatment Cleansed;Other (Comment)   Selective Debridement - Location --   Selective Debridement - Tools Used --   Selective Debridement - Tissue Removed --   Wound Therapy - Clinical Statement Overall patient wounds are improving with improved granulation.  Pt wounds no longer need any debridement at this time.  Pt will continue to benefit from skilled care for manual to decongestive techniques and multi-layer compression bandaging to decrease lymphedema to  provide a healing environment.  Pt seen for cleansing of B LE, moisturizing followed by decongestive techniques; Rt LE bandaged with multilayer short stretch bandages and foam; Lt LE recieved xeroform to medial wounds followed by profore multilayer compression dressing    Wound Therapy - Functional Problem List non healing wounds    Factors Delaying/Impairing Wound Healing Altered sensation;Diabetes Mellitus;Infection - systemic/local;Multiple medical problems;Vascular compromise   Hydrotherapy Plan Other (comment);Dressing change  complete decongestive therapy; aquire home pump    Wound Therapy - Frequency Other (comment)   Wound Therapy - Current Recommendations PT   Wound Plan decongestive therapy followed by dressing xeroform on Left, silverhydrofiber for Rt., vaseline, profore    Manual Therapy supraclavicular followed by deep and superfical abdominal, routing fluid using inguinal/axillary anastomosis and LE.  Manual done both anteriorly and posteriorly on bilateral LE>                    PT Short Term Goals - May 20, 2016 1603    PT SHORT TERM GOAL #1   Title Patient to present with 0% slough on all wounds and 100% red granulation tissue to demonstrate overall improvement of condition    Time 4   Period Weeks   Status Achieved   PT SHORT TERM GOAL #2   Title Patient to demonstarte improved moisture in tissues surrounding wounds in order to facilitate good healing process    Time 4   Period Weeks   Status Partially Met   PT SHORT TERM GOAL #3   Title All wound dimensions to have decreased by at least 40% in order to demonstarte improved condition    Time 4   Period Weeks   Status Partially Met           PT Long Term Goals - 2016-05-20 1604    PT LONG TERM GOAL #1   Title Patient to demonstrate full wound healing in all wounds   Time 8   Period Weeks   Status On-going   PT LONG TERM GOAL #2   Title Patient to be wearing compression at all times to assist in  preventing recurrence of wounds in face of lymphedema co-morbidity    Time 8   Period Weeks   Status Achieved             Patient will benefit from skilled therapeutic intervention in order to improve the following deficits and impairments:     Visit Diagnosis: 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)  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 ([place])  Unspecified open wound, right lower leg, sequela  Open wound, lower leg, left, sequela      G-Codes - May 20, 2016 1605    Functional Assessment Tool Used Based on wound status, presence of slough    Functional Limitation Mobility: Walking and moving  around   Mobility: Walking and Moving Around Current Status [phone]) At least 40 percent but less than 60 percent impaired, limited or restricted   Mobility: Walking and Moving Around Goal Status [phone]) At least 40 percent but less than 60 percent impaired, limited or restricted       Problem List Patient Active Problem List   Diagnosis Date Noted  . Encounter for screening colonoscopy 10/07/2015  . Cellulitis of right lower extremity   . UTI (lower urinary tract infection) 02/03/2015  . Cellulitis 02/03/2015  . Blepharitis 02/03/2015  . Chronic anticoagulation 11/14/2014  . 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 Leg wound, left 08/19/2011  . DVT (deep venous thrombosis) ([place]) 06/15/2011  . H/O PE (pulmonary embolism) 06/15/2011  . 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

## 2016-05-11 ENCOUNTER — Ambulatory Visit (HOSPITAL_COMMUNITY): Payer: Medicare Other | Admitting: Physical Therapy

## 2016-05-11 DIAGNOSIS — S81801S Unspecified open wound, right lower leg, sequela: Secondary | ICD-10-CM | POA: Diagnosis not present

## 2016-05-11 DIAGNOSIS — I83012 Varicose veins of right lower extremity with ulcer of calf: Secondary | ICD-10-CM

## 2016-05-11 DIAGNOSIS — M79661 Pain in right lower leg: Secondary | ICD-10-CM | POA: Diagnosis not present

## 2016-05-11 DIAGNOSIS — L97309 Non-pressure chronic ulcer of unspecified ankle with unspecified severity: Secondary | ICD-10-CM

## 2016-05-11 DIAGNOSIS — L97329 Non-pressure chronic ulcer of left ankle with unspecified severity: Secondary | ICD-10-CM

## 2016-05-11 DIAGNOSIS — S81802S Unspecified open wound, left lower leg, sequela: Secondary | ICD-10-CM | POA: Diagnosis not present

## 2016-05-11 DIAGNOSIS — I83023 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: Secondary | ICD-10-CM

## 2016-05-11 DIAGNOSIS — L97219 Non-pressure chronic ulcer of right calf with unspecified severity: Principal | ICD-10-CM

## 2016-05-11 NOTE — Therapy (Signed)
[place], [place], [address] Phone: [phone]   Fax:  [phone]  Wound Care Therapy  Patient Details  Name: Matthew Wise MRN: [id number] Date of Birth: [birth year] No Data Recorded  Encounter Date: 05/11/2016      PT End of Session - 05/11/16 1611    Visit Number 10   Number of Visits 16   Date for PT Re-Evaluation 06/06/16   Authorization Type medicare   Authorization - Visit Number 10   Authorization - Number of Visits 16   PT Start Time 3382   PT Stop Time 1545   PT Time Calculation (min) 70 min   Activity Tolerance Patient tolerated treatment well      Past Medical History  Diagnosis Date  . DVT of leg (deep venous thrombosis) (HCC)     rt. leg  . Inferior vena caval thrombosis (HCC)     Chronic  . DVT (deep venous thrombosis) ([place]) 06/15/2011  . H/O PE (pulmonary embolism) 06/15/2011  . 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 Chronic anticoagulation 11/14/2014    Past Surgical History  Procedure Laterality Date  . None to date  10/07/15  . Colonoscopy with propofol N/A 10/29/2015    Procedure: COLONOSCOPY WITH PROPOFOL;  Surgeon: Danie Binder, MD;  [facility];  Service: Endoscopy;  Laterality: N/A;  930    There were no vitals filed for this visit.                  Wound Therapy - 05/11/16 1605    Subjective Pt states that he had a little pain last night but is doing better today    Patient and Family Stated Goals wounds to heal    Pain Assessment No/denies pain   Wound Properties Date First Assessed: 04/01/16 Wound Type: Other (Comment) , anterior medial L tibia   Location: Tibial Location Orientation: Left;Anterior;Medial Present on Admission: Yes   Dressing Type Compression wrap;Impregnated gauze (bismuth)   Dressing Status None   Dressing Change Frequency PRN   %  Wound base Red or Granulating 95%  was 50%    % Wound base Yellow 5%   Peri-wound Assessment Edema;Induration   Closure None   Drainage Amount Scant   Drainage Description Serosanguineous   Treatment Cleansed;Debridement (Selective);Other (Comment)  manual decongestive techniques and compression bandaging    Wound Properties Date First Assessed: 04/01/16 Wound Type: Other (Comment) , R posterior calf   Location: Tibial Location Orientation: Posterior;Right Wound Description (Comments): posterior R calf  Present on Admission: Yes   Dressing Type Compression wrap;Silver hydrofiber   Dressing Status None   Dressing Change Frequency PRN   % Wound base Red or Granulating 95%  was 50%    % Wound base Yellow 5%   Peri-wound Assessment Induration;Erythema (blanchable)   Closure None   Drainage Amount Minimal   Drainage Description Purulent;Serosanguineous   Treatment Cleansed;Debridement (Selective);Other (Comment)  manual decongestive techniques and compression bandaging    Selective Debridement - Location all wounds    Selective Debridement - Tools Used Forceps   Selective Debridement - Tissue Removed slough and dry skin    Wound Therapy - Clinical Statement Pt Lt LE improved significantly; Rt LE remains slow to heal with induration.  Pt seen for cleansing of B LE, moisturizing followed by decongestive techniques and debridement. Rt LE bandaged with multilayer  short stretch bandages and foam; Lt LE recieved xeroform to medial wounds followed by profore multilayer compression dressing    Wound Therapy - Functional Problem List non healing wounds    Factors Delaying/Impairing Wound Healing Altered sensation;Diabetes Mellitus;Infection - systemic/local;Multiple medical problems;Vascular compromise   Hydrotherapy Plan Other (comment);Dressing change  complete decongestive therapy; aquire home pump    Wound Therapy - Frequency Other (comment)   Wound Therapy - Current Recommendations PT   Wound  Plan debridement to wounds as needed followed by decongestive therapy followed by dressing xeroform on Left, silverhydrofiber for Rt., vaseline, profore    Manual Therapy supraclavicular followed by deep and superfical abdominal, routing fluid using inguinal/axillary anastomosis and LE.  Manual done both anteriorly and posteriorly on bilateral LE> Done separate from all other aspects of treatment.                    PT Short Term Goals - 05/07/16 1603    PT SHORT TERM GOAL #1   Title Patient to present with 0% slough on all wounds and 100% red granulation tissue to demonstrate overall improvement of condition    Time 4   Period Weeks   Status Achieved   PT SHORT TERM GOAL #2   Title Patient to demonstarte improved moisture in tissues surrounding wounds in order to facilitate good healing process    Time 4   Period Weeks   Status Partially Met   PT SHORT TERM GOAL #3   Title All wound dimensions to have decreased by at least 40% in order to demonstarte improved condition    Time 4   Period Weeks   Status Partially Met           PT Long Term Goals - 05/07/16 1604    PT LONG TERM GOAL #1   Title Patient to demonstrate full wound healing in all wounds   Time 8   Period Weeks   Status On-going   PT LONG TERM GOAL #2   Title Patient to be wearing compression at all times to assist in preventing recurrence of wounds in face of lymphedema co-morbidity    Time 8   Period Weeks   Status Achieved             Patient will benefit from skilled therapeutic intervention in order to improve the following deficits and impairments:     Visit Diagnosis: 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 Encounter for screening colonoscopy 10/07/2015  . Cellulitis of right lower extremity   . UTI (lower urinary tract infection) 02/03/2015  . Cellulitis 02/03/2015  . Blepharitis 02/03/2015  . Chronic anticoagulation 11/14/2014  . 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 Leg wound, left 08/19/2011  . DVT (deep venous thrombosis) ([place]) 06/15/2011  . H/O PE (pulmonary embolism) 06/15/2011  . 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 [place], [place], [address] Phone: [phone]   Fax:  [phone]  Name: Matthew Wise MRN: [id number] Date of Birth:  11/15/1961      

## 2016-05-12 ENCOUNTER — Ambulatory Visit (HOSPITAL_COMMUNITY): Payer: Medicare Other | Admitting: Physical Therapy

## 2016-05-14 ENCOUNTER — Ambulatory Visit (HOSPITAL_COMMUNITY): Payer: Medicare Other | Admitting: Physical Therapy

## 2016-05-14 DIAGNOSIS — M79661 Pain in right lower leg: Secondary | ICD-10-CM | POA: Diagnosis not present

## 2016-05-14 DIAGNOSIS — S81801S Unspecified open wound, right lower leg, sequela: Secondary | ICD-10-CM | POA: Diagnosis not present

## 2016-05-14 DIAGNOSIS — S81802S Unspecified open wound, left lower leg, sequela: Secondary | ICD-10-CM | POA: Diagnosis not present

## 2016-05-14 DIAGNOSIS — L97219 Non-pressure chronic ulcer of right calf with unspecified severity: Principal | ICD-10-CM

## 2016-05-14 DIAGNOSIS — I83023 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: Secondary | ICD-10-CM

## 2016-05-14 DIAGNOSIS — L97309 Non-pressure chronic ulcer of unspecified ankle with unspecified severity: Secondary | ICD-10-CM

## 2016-05-14 DIAGNOSIS — I83012 Varicose veins of right lower extremity with ulcer of calf: Secondary | ICD-10-CM

## 2016-05-14 DIAGNOSIS — L97329 Non-pressure chronic ulcer of left ankle with unspecified severity: Secondary | ICD-10-CM

## 2016-05-14 NOTE — Therapy (Signed)
[facility] [address] [place], [place], [address] Phone: [phone]   Fax:  [phone]  Wound Care Therapy  Patient Details  Name: Matthew Wise MRN: [id number] Date of Birth: 07/08/1961 No Data Recorded  Encounter Date: 05/14/2016      PT End of Session - 05/14/16 1751    Visit Number 11   Number of Visits 16   Date for PT Re-Evaluation 06/06/16   Authorization Type medicare   Authorization - Visit Number 11   Authorization - Number of Visits 16   PT Start Time 1440   PT Stop Time 1550   PT Time Calculation (min) 70 min   Activity Tolerance Patient tolerated treatment well   Behavior During Therapy WFL for tasks assessed/performed      Past Medical History  Diagnosis Date  . DVT of leg (deep venous thrombosis) (HCC)     rt. leg  . Inferior vena caval thrombosis (HCC)     Chronic  . DVT (deep venous thrombosis) (HCC) 06/15/2011  . H/O PE (pulmonary embolism) 06/15/2011  . 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 Chronic anticoagulation 11/14/2014    Past Surgical History  Procedure Laterality Date  . None to date  10/07/15  . Colonoscopy with propofol N/A 10/29/2015    Procedure: COLONOSCOPY WITH PROPOFOL;  Surgeon: Sandi L Fields, MD;  [facility];  Service: Endoscopy;  Laterality: N/A;  930    There were no vitals filed for this visit.                  Wound Therapy - 05/14/16 1747    Subjective PT states his legs are sore, especially posteriorly   Patient and Family Stated Goals wounds to heal    Pain Assessment No/denies pain   Wound Properties Date First Assessed: 04/01/16 Wound Type: Other (Comment) , anterior medial L tibia   Location: Tibial Location Orientation: Left;Anterior;Medial Present on Admission: Yes   Dressing Type Compression wrap;Impregnated gauze (bismuth)   Dressing Changed  Changed   Dressing Status None   Dressing Change Frequency PRN   % Wound base Red or Granulating 95%  was 50%    % Wound base Yellow 5%   Peri-wound Assessment Edema;Induration   Closure None   Drainage Amount Scant   Drainage Description Serosanguineous   Treatment Cleansed;Debridement (Selective)   Wound Properties Date First Assessed: 04/01/16 Wound Type: Other (Comment) , R posterior calf   Location: Tibial Location Orientation: Posterior;Right Wound Description (Comments): posterior R calf  Present on Admission: Yes   Dressing Type Compression wrap;Silver hydrofiber   Dressing Changed Changed   Dressing Status None   Dressing Change Frequency PRN   % Wound base Red or Granulating 95%  was 50%    % Wound base Yellow 5%   Peri-wound Assessment Induration;Erythema (blanchable)   Closure None   Drainage Amount Minimal   Drainage Description Purulent;Serosanguineous   Treatment Cleansed;Debridement (Selective)   Selective Debridement - Location all wounds    Selective Debridement - Tools Used Forceps   Selective Debridement - Tissue Removed slough and dry skin    Wound Therapy - Clinical Statement Posterior LE wounds remain slow to heal.  Rt anterior and Lt medial wounds are looking better with mostly dry skin and little drainage.  LE's cleansed and moisturized prior to rebandaging using profore bilaterally as she did not bring   his short stretch.  Used xerform on all wounds this session.   Wound Therapy - Functional Problem List non healing wounds    Factors Delaying/Impairing Wound Healing Altered sensation;Diabetes Mellitus;Infection - systemic/local;Multiple medical problems;Vascular compromise   Hydrotherapy Plan Other (comment);Dressing change  complete decongestive therapy; aquire home pump    Wound Therapy - Frequency Other (comment)   Wound Therapy - Current Recommendations PT   Wound Plan debridement to wounds as needed followed by decongestive therapy followed by  appropriate dressings and compression   Manual Therapy short neck,  deep and superfical abdominal, routing fluid using inguinal/axillary anastomosis for bilateral LE's.  Manual done anteriorly this session.                   PT Short Term Goals - 05/07/16 1603    PT SHORT TERM GOAL #1   Title Patient to present with 0% slough on all wounds and 100% red granulation tissue to demonstrate overall improvement of condition    Time 4   Period Weeks   Status Achieved   PT SHORT TERM GOAL #2   Title Patient to demonstarte improved moisture in tissues surrounding wounds in order to facilitate good healing process    Time 4   Period Weeks   Status Partially Met   PT SHORT TERM GOAL #3   Title All wound dimensions to have decreased by at least 40% in order to demonstarte improved condition    Time 4   Period Weeks   Status Partially Met           PT Long Term Goals - 05/07/16 1604    PT LONG TERM GOAL #1   Title Patient to demonstrate full wound healing in all wounds   Time 8   Period Weeks   Status On-going   PT LONG TERM GOAL #2   Title Patient to be wearing compression at all times to assist in preventing recurrence of wounds in face of lymphedema co-morbidity    Time 8   Period Weeks   Status Achieved             Patient will benefit from skilled therapeutic intervention in order to improve the following deficits and impairments:     Visit Diagnosis: 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 Encounter for screening colonoscopy 10/07/2015  . Cellulitis of right lower extremity   . UTI (lower urinary tract infection) 02/03/2015  . Cellulitis 02/03/2015  . Blepharitis 02/03/2015  . Chronic  anticoagulation 11/14/2014  . 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 Leg wound, left 08/19/2011  . DVT (deep venous thrombosis) (HCC) 06/15/2011  . H/O PE (pulmonary embolism) 06/15/2011  . 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      

## 2016-05-18 ENCOUNTER — Ambulatory Visit (HOSPITAL_COMMUNITY): Payer: Medicare Other | Admitting: Physical Therapy

## 2016-05-18 DIAGNOSIS — S81801S Unspecified open wound, right lower leg, sequela: Secondary | ICD-10-CM

## 2016-05-18 DIAGNOSIS — M79661 Pain in right lower leg: Secondary | ICD-10-CM | POA: Diagnosis not present

## 2016-05-18 DIAGNOSIS — S81802S Unspecified open wound, left lower leg, sequela: Secondary | ICD-10-CM | POA: Diagnosis not present

## 2016-05-18 DIAGNOSIS — L97329 Non-pressure chronic ulcer of left ankle with unspecified severity: Secondary | ICD-10-CM

## 2016-05-18 DIAGNOSIS — L97219 Non-pressure chronic ulcer of right calf with unspecified severity: Principal | ICD-10-CM

## 2016-05-18 DIAGNOSIS — I83023 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: Secondary | ICD-10-CM

## 2016-05-18 DIAGNOSIS — I83012 Varicose veins of right lower extremity with ulcer of calf: Secondary | ICD-10-CM

## 2016-05-18 DIAGNOSIS — L97309 Non-pressure chronic ulcer of unspecified ankle with unspecified severity: Secondary | ICD-10-CM

## 2016-05-18 NOTE — Therapy (Signed)
[place], [place], [address] Phone: [phone]   Fax:  [phone]  Wound Care Therapy  Patient Details  Name: Matthew Wise MRN: [id number] Date of Birth: 1961-03-12 No Data Recorded  Encounter Date: 05/18/2016      PT End of Session - 05/18/16 1603    Visit Number 12   Number of Visits 16   Date for PT Re-Evaluation 06/06/16   Authorization Type medicare   Authorization - Visit Number 12   Authorization - Number of Visits 16   PT Start Time 1430   PT Stop Time 1340   PT Time Calculation (min) 1390 min   Activity Tolerance Patient tolerated treatment well   Behavior During Therapy [facility] for tasks assessed/performed      Past Medical History  Diagnosis Date  . DVT of leg (deep venous thrombosis) (HCC)     rt. leg  . Inferior vena caval thrombosis (HCC)     Chronic  . DVT (deep venous thrombosis) ([place]) 06/15/2011  . H/O PE (pulmonary embolism) 06/15/2011  . 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 Chronic anticoagulation 11/14/2014    Past Surgical History  Procedure Laterality Date  . None to date  10/07/15  . Colonoscopy with propofol N/A 10/29/2015    Procedure: COLONOSCOPY WITH PROPOFOL;  Surgeon: Danie Binder, MD;  [facility];  Service: Endoscopy;  Laterality: N/A;  930    There were no vitals filed for this visit.             Wound Therapy - 05/18/16 1551    Subjective Pt states that he has been up on his legs more this weekend and has increased swelling    Patient and Family Stated Goals wounds to heal    Pain Assessment No/denies pain   Wound Properties Date First Assessed: 04/01/16 Wound Type: Other (Comment) , anterior medial L tibia   Location: Tibial Location Orientation: Left;Anterior;Medial Present on Admission: Yes Final Assessment Date: 05/18/16 Final Assessment Time: 1553    % Wound base Red or Granulating --  was 50% this wound is now healed but now has a wound on the posterior aspect of his Lt LE      Wound Properties Date First Assessed: 04/01/16 Wound Type: Other (Comment) , R posterior calf   Location: Tibial Location Orientation: Posterior;Right Wound Description (Comments): posterior R calf  Present on Admission: Yes   Dressing Type Compression wrap;Silver hydrofiber   Dressing Changed Changed   Dressing Status None   Dressing Change Frequency PRN   % Wound base Red or Granulating 95%  was 50%    % Wound base Yellow 5%   Peri-wound Assessment Induration;Erythema (blanchable)   Wound Length (cm) --  2 areas now: lateral .7; medial 2   Wound Width (cm) --  lateral 5.5; medial 2    Closure None   Drainage Amount Minimal   Drainage Description Purulent;Serosanguineous   Treatment Cleansed;Debridement (Selective)   Wound Properties Date First Assessed: 05/18/16 Time First Assessed: 1454 Wound Type: Other (Comment) Location: Leg Location Orientation: Left;Posterior Wound Description (Comments): .5 x 1.7 cm xeroform followed by short stretch bandages    Dressing Type Impregnated gauze (bismuth)   Dressing Changed Changed   Dressing Status Old drainage   Dressing Change Frequency PRN   Site / Wound Assessment Granulation tissue   % Wound base  Red or Granulating 100%   Peri-wound Assessment Hemosiderin;Edema   Wound Length (cm) 0.6 cm   Wound Width (cm) 1.7 cm   Drainage Amount Minimal   Drainage Description Serous   Treatment Cleansed;Other (Comment)  decongestive manual techniques/compression dressing    Selective Debridement - Location all wounds    Selective Debridement - Tools Used Forceps   Selective Debridement - Tissue Removed slough and dry skin    Wound Therapy - Clinical Statement Wounds on medial aspect of Lt leg are now healed but pt now has posterior wound on his Lt leg.  Pt with increased induration on Rt LE and anterior wound has  reopened.  Pt wrapped with profore on Rt LE and short stretch on Lt LE    Wound Therapy - Functional Problem List non healing wounds    Factors Delaying/Impairing Wound Healing Altered sensation;Diabetes Mellitus;Infection - systemic/local;Multiple medical problems;Vascular compromise   Hydrotherapy Plan Other (comment);Dressing change  complete decongestive therapy; aquire home pump    Wound Therapy - Frequency Other (comment)   Wound Therapy - Current Recommendations PT   Wound Plan debridement to wounds as needed followed by decongestive therapy followed by appropriate dressings and compression   Manual Therapy short neck,  deep and superfical abdominal, routing fluid using inguinal/axillary anastomosis for bilateral LE's.  Manual done anteriorly this session.                   PT Short Term Goals - 05/18/16 1605    PT SHORT TERM GOAL #1   Title Patient to present with 0% slough on all wounds and 100% red granulation tissue to demonstrate overall improvement of condition    Time 4   Period Weeks   Status Achieved   PT SHORT TERM GOAL #2   Title Patient to demonstarte improved moisture in tissues surrounding wounds in order to facilitate good healing process    Time 4   Period Weeks   Status Partially Met   PT SHORT TERM GOAL #3   Title All wound dimensions to have decreased by at least 40% in order to demonstarte improved condition    Time 4   Period Weeks   Status Partially Met           PT Long Term Goals - 05/18/16 1605    PT LONG TERM GOAL #1   Title Patient to demonstrate full wound healing in all wounds   Time 8   Period Weeks   Status On-going   PT LONG TERM GOAL #2   Title Patient to be wearing compression at all times to assist in preventing recurrence of wounds in face of lymphedema co-morbidity    Time 8   Period Weeks   Status Achieved               Plan - 05/18/16 1606    Rehab Potential Good   PT Treatment/Interventions Manual lymph  drainage;Manual techniques;Compression bandaging;Other (comment)  debridement   PT Next Visit Plan continue with manual decompression and compression bandaging       Patient will benefit from skilled therapeutic intervention in order to improve the following deficits and impairments:  Pain, Increased edema, Other (comment)  Visit Diagnosis: 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 Encounter for screening colonoscopy 10/07/2015  . Cellulitis of right lower extremity   . UTI (lower urinary tract infection) 02/03/2015  . Cellulitis 02/03/2015  . Blepharitis 02/03/2015  . Chronic anticoagulation 11/14/2014  . 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 Leg wound, left 08/19/2011  . DVT (deep venous thrombosis) ([place]) 06/15/2011  . H/O PE (pulmonary embolism) 06/15/2011  . 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

## 2016-05-19 ENCOUNTER — Ambulatory Visit (HOSPITAL_COMMUNITY): Payer: Medicare Other

## 2016-05-21 ENCOUNTER — Ambulatory Visit (HOSPITAL_COMMUNITY): Payer: Medicare Other | Admitting: Physical Therapy

## 2016-05-21 ENCOUNTER — Telehealth (HOSPITAL_COMMUNITY): Payer: Self-pay | Admitting: Physical Therapy

## 2016-05-21 DIAGNOSIS — S81802S Unspecified open wound, left lower leg, sequela: Secondary | ICD-10-CM

## 2016-05-21 DIAGNOSIS — M79661 Pain in right lower leg: Secondary | ICD-10-CM | POA: Diagnosis not present

## 2016-05-21 DIAGNOSIS — L97309 Non-pressure chronic ulcer of unspecified ankle with unspecified severity: Secondary | ICD-10-CM

## 2016-05-21 DIAGNOSIS — I83012 Varicose veins of right lower extremity with ulcer of calf: Secondary | ICD-10-CM

## 2016-05-21 DIAGNOSIS — I83023 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: Secondary | ICD-10-CM

## 2016-05-21 DIAGNOSIS — L97219 Non-pressure chronic ulcer of right calf with unspecified severity: Principal | ICD-10-CM

## 2016-05-21 DIAGNOSIS — S81801S Unspecified open wound, right lower leg, sequela: Secondary | ICD-10-CM

## 2016-05-21 DIAGNOSIS — L97329 Non-pressure chronic ulcer of left ankle with unspecified severity: Secondary | ICD-10-CM

## 2016-05-21 NOTE — Telephone Encounter (Signed)
faxed demographics sheet and copy of next scheduled appts here at clinic over to [facility] to go ahead and get him set up.  Will fax order over when patient brings it. Teena Irani, PTA/CLT [phone]

## 2016-05-21 NOTE — Therapy (Signed)
[place], [place], [address] Phone: [phone]   Fax:  [phone]  Wound Care Therapy  Patient Details  Name: Matthew Wise MRN: [id number] Date of Birth: [birth year] No Data Recorded  Encounter Date: 05/21/2016      PT End of Session - 05/21/16 1741    Visit Number 13   Number of Visits 16   Date for PT Re-Evaluation 06/06/16   Authorization Type medicare   Authorization - Visit Number 13   Authorization - Number of Visits 16   PT Start Time 1350   PT Stop Time 1450   PT Time Calculation (min) 60 min   Activity Tolerance Patient tolerated treatment well   Behavior During Therapy [facility] for tasks assessed/performed      Past Medical History  Diagnosis Date  . DVT of leg (deep venous thrombosis) (HCC)     rt. leg  . Inferior vena caval thrombosis (HCC)     Chronic  . DVT (deep venous thrombosis) ([place]) 06/15/2011  . H/O PE (pulmonary embolism) 06/15/2011  . 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 Chronic anticoagulation 11/14/2014    Past Surgical History  Procedure Laterality Date  . None to date  10/07/15  . Colonoscopy with propofol N/A 10/29/2015    Procedure: COLONOSCOPY WITH PROPOFOL;  Surgeon: Danie Binder, MD;  [facility];  Service: Endoscopy;  Laterality: N/A;  930    There were no vitals filed for this visit.       Subjective Assessment - 05/21/16 1732    Subjective Pt states his legs are still hurting posteriorly and has one little area that hurts the most on his Lt medial LE.                     Wound Therapy - 05/21/16 1733    Subjective Pt states one area is particullarly sore medial posterior Lt LE.  comes today with juxtafit on   Patient and Family Stated Goals wounds to heal    Pain Assessment 0-10   Pain Score 4    Pain Type Acute pain   Pain Location Leg   Pain Orientation Medial;Posterior   Pain Onset With Activity   Wound Properties Date First Assessed: 04/01/16 Wound Type: Other (Comment) , R posterior calf   Location: Tibial Location Orientation: Posterior;Right Wound Description (Comments): posterior R calf  Present on Admission: Yes   Dressing Type Compression wrap;Silver hydrofiber   Dressing Status None   Dressing Change Frequency PRN   % Wound base Red or Granulating 95%  was 50%    % Wound base Yellow 5%   Peri-wound Assessment Induration;Erythema (blanchable)   Closure None   Drainage Amount Minimal   Drainage Description Purulent;Serosanguineous   Treatment Cleansed;Debridement (Selective)   Wound Properties Date First Assessed: 05/18/16 Time First Assessed: 1454 Wound Type: Other (Comment) Location: Leg Location Orientation: Left;Posterior Wound Description (Comments): .5 x 1.7 cm xeroform followed by short stretch bandages    Dressing Type Impregnated gauze (bismuth)   Dressing Changed Changed   Dressing Status Old drainage   Dressing Change Frequency PRN   Site / Wound Assessment Granulation tissue   % Wound base Red or Granulating 100%   Peri-wound Assessment Edema;Induration   Drainage Amount Minimal   Drainage Description Serous   Treatment Cleansed;Debridement (Selective)   Selective Debridement - Location  all wounds    Selective Debridement - Tools Used Forceps   Selective Debridement - Tissue Removed slough and dry skin    Wound Therapy - Clinical Statement Continued to work on  wounds posterior LE's. Removed dry scab from tender area posterior Lt LE, however no signs or symptoms of infection or noted reason for discomfort.   Discussed possible need for custom garments due to history of chronic, recurring wounds and patient agreed.  Patient has order for new garments at home he will bring next session.  Will fax over to [facility] when received.  Went ahead and faxed over patients schedule and demographics sheet to  get him scheduled.    Wound Therapy - Functional Problem List non healing wounds    Factors Delaying/Impairing Wound Healing Altered sensation;Diabetes Mellitus;Infection - systemic/local;Multiple medical problems;Vascular compromise   Hydrotherapy Plan Other (comment);Dressing change  complete decongestive therapy; aquire home pump    Wound Therapy - Frequency Other (comment)   Wound Therapy - Current Recommendations PT   Wound Plan debridement to wounds as needed followed by decongestive therapy followed by appropriate dressings and compression   Manual Therapy unable to complete manual due to time                 PT Education - 05/21/16 1739    Education provided Yes   Education Details discussed possible need for custom garments due to history of chronic, recurring wounds.   Methods Explanation   Comprehension Verbalized understanding          PT Short Term Goals - 05/18/16 1605    PT SHORT TERM GOAL #1   Title Patient to present with 0% slough on all wounds and 100% red granulation tissue to demonstrate overall improvement of condition    Time 4   Period Weeks   Status Achieved   PT SHORT TERM GOAL #2   Title Patient to demonstarte improved moisture in tissues surrounding wounds in order to facilitate good healing process    Time 4   Period Weeks   Status Partially Met   PT SHORT TERM GOAL #3   Title All wound dimensions to have decreased by at least 40% in order to demonstarte improved condition    Time 4   Period Weeks   Status Partially Met           PT Long Term Goals - 05/18/16 1605    PT LONG TERM GOAL #1   Title Patient to demonstrate full wound healing in all wounds   Time 8   Period Weeks   Status On-going   PT LONG TERM GOAL #2   Title Patient to be wearing compression at all times to assist in preventing recurrence of wounds in face of lymphedema co-morbidity    Time 8   Period Weeks   Status Achieved             Patient will  benefit from skilled therapeutic intervention in order to improve the following deficits and impairments:     Visit Diagnosis: No diagnosis found.     Problem List Patient Active Problem List   Diagnosis Date Noted  . Encounter for screening colonoscopy 10/07/2015  . Cellulitis of right lower extremity   . UTI (lower urinary tract infection) 02/03/2015  . Cellulitis 02/03/2015  . Blepharitis 02/03/2015  . Chronic anticoagulation 11/14/2014  . 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 Leg wound, left 08/19/2011  . DVT (  deep venous thrombosis) ([place]) 06/15/2011  . H/O PE (pulmonary embolism) 06/15/2011  . 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 [place], [place], [address] Phone: [phone]   Fax:  [phone]  Name: EJAY LASHLEY MRN: [id number] Date of Birth: 1961/05/10

## 2016-05-25 ENCOUNTER — Telehealth (HOSPITAL_COMMUNITY): Payer: Self-pay

## 2016-05-25 ENCOUNTER — Ambulatory Visit (HOSPITAL_COMMUNITY): Payer: Medicare Other | Admitting: Physical Therapy

## 2016-05-25 NOTE — Telephone Encounter (Signed)
05/25/16 said he wouldn't be able to make it in today

## 2016-05-26 ENCOUNTER — Ambulatory Visit (HOSPITAL_COMMUNITY): Payer: Medicare Other | Admitting: Physical Therapy

## 2016-05-28 ENCOUNTER — Ambulatory Visit (HOSPITAL_COMMUNITY): Payer: Medicare Other | Admitting: Physical Therapy

## 2016-05-28 DIAGNOSIS — I83012 Varicose veins of right lower extremity with ulcer of calf: Secondary | ICD-10-CM

## 2016-05-28 DIAGNOSIS — L97329 Non-pressure chronic ulcer of left ankle with unspecified severity: Secondary | ICD-10-CM

## 2016-05-28 DIAGNOSIS — S81802S Unspecified open wound, left lower leg, sequela: Secondary | ICD-10-CM

## 2016-05-28 DIAGNOSIS — M79661 Pain in right lower leg: Secondary | ICD-10-CM | POA: Diagnosis not present

## 2016-05-28 DIAGNOSIS — I83018 Varicose veins of right lower extremity with ulcer other part of lower leg: Secondary | ICD-10-CM

## 2016-05-28 DIAGNOSIS — L97819 Non-pressure chronic ulcer of other part of right lower leg with unspecified severity: Secondary | ICD-10-CM

## 2016-05-28 DIAGNOSIS — L97309 Non-pressure chronic ulcer of unspecified ankle with unspecified severity: Secondary | ICD-10-CM

## 2016-05-28 DIAGNOSIS — I83023 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: Secondary | ICD-10-CM

## 2016-05-28 DIAGNOSIS — L97219 Non-pressure chronic ulcer of right calf with unspecified severity: Principal | ICD-10-CM

## 2016-05-28 DIAGNOSIS — S81801S Unspecified open wound, right lower leg, sequela: Secondary | ICD-10-CM

## 2016-05-28 NOTE — Therapy (Signed)
[place], [place], [address] Phone: [phone]   Fax:  [phone]  Wound Care Therapy  Patient Details  Name: SRIRAM FEBLES MRN: [id number] Date of Birth: 1961/03/28 No Data Recorded  Encounter Date: 05/28/2016      PT End of Session - 05/28/16 1558    Visit Number 14   Number of Visits 16   Date for PT Re-Evaluation 06/06/16   Authorization Type medicare   Authorization - Visit Number 14   Authorization - Number of Visits 16   PT Start Time 3785   PT Stop Time 1535   PT Time Calculation (min) 60 min   Activity Tolerance Patient tolerated treatment well   Behavior During Therapy [facility] for tasks assessed/performed      Past Medical History  Diagnosis Date  . DVT of leg (deep venous thrombosis) (HCC)     rt. leg  . Inferior vena caval thrombosis (HCC)     Chronic  . DVT (deep venous thrombosis) ([place]) 06/15/2011  . H/O PE (pulmonary embolism) 06/15/2011  . 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 Chronic anticoagulation 11/14/2014    Past Surgical History  Procedure Laterality Date  . None to date  10/07/15  . Colonoscopy with propofol N/A 10/29/2015    Procedure: COLONOSCOPY WITH PROPOFOL;  Surgeon: Danie Binder, MD;  [facility];  Service: Endoscopy;  Laterality: N/A;  930    There were no vitals filed for this visit.                  Wound Therapy - 05/28/16 1554    Subjective Pt states [place] came for consult.  Pt is hoping he can afford the garments.  Pt states his legs are not hurting like they were and feels they are getting better.    Patient and Family Stated Goals wounds to heal    Pain Assessment No/denies pain   Pain Score 2    Pain Type Acute pain   Pain Location Leg   Pain Orientation Right;Left;Medial;Posterior   Pain Descriptors / Indicators Discomfort   Wound  Properties Date First Assessed: 04/01/16 Wound Type: Other (Comment) , R posterior calf   Location: Tibial Location Orientation: Posterior;Right Wound Description (Comments): posterior R calf  Present on Admission: Yes   Dressing Type Compression wrap;Impregnated gauze (bismuth)   Dressing Changed Changed   Dressing Status None   Dressing Change Frequency PRN   % Wound base Red or Granulating 95%  was 50%    % Wound base Yellow 5%   Peri-wound Assessment Induration;Erythema (blanchable)   Closure None   Drainage Amount Minimal   Drainage Description Purulent;Serosanguineous   Treatment Cleansed;Debridement (Selective)   Wound Properties Date First Assessed: 05/18/16 Time First Assessed: 1454 Wound Type: Other (Comment) Location: Leg Location Orientation: Left;Posterior Wound Description (Comments): .5 x 1.7 cm xeroform followed by short stretch bandages    Dressing Type Impregnated gauze (bismuth)   Dressing Changed Changed   Dressing Status Old drainage   Dressing Change Frequency PRN   Site / Wound Assessment Granulation tissue   % Wound base Red or Granulating 100%   Peri-wound Assessment Edema;Induration   Drainage Amount Minimal   Drainage Description Serous   Treatment Cleansed;Debridement (Selective)   Selective Debridement - Location all wounds    Selective Debridement - Tools Used Forceps   Selective  Debridement - Tissue Removed slough and dry skin    Wound Therapy - Clinical Statement Overall improvement with increased approximation of bilateral posterior LE wounds.Manual completed with cleansing and heavy moistuization of LE's prior to bandaging.  Pt did not have any bandages on his LE upon arrival but told therapist he did up until yesterday.  Wounds, however uncovered.  Reminded to keep wounds covered to prevent infection.     Wound Therapy - Functional Problem List non healing wounds    Factors Delaying/Impairing Wound Healing Altered sensation;Diabetes Mellitus;Infection -  systemic/local;Multiple medical problems;Vascular compromise   Hydrotherapy Plan Other (comment);Dressing change  complete decongestive therapy; aquire home pump    Wound Therapy - Frequency Other (comment)   Wound Therapy - Current Recommendations PT   Wound Plan debridement to wounds as needed followed by decongestive therapy followed by appropriate dressings and compression   Manual Therapy short neck, deep and superficial abdominal.  Routing of fluid to bilateral inguinal and inguinal-axillary anastomosis                   PT Short Term Goals - 05/18/16 1605    PT SHORT TERM GOAL #1   Title Patient to present with 0% slough on all wounds and 100% red granulation tissue to demonstrate overall improvement of condition    Time 4   Period Weeks   Status Achieved   PT SHORT TERM GOAL #2   Title Patient to demonstarte improved moisture in tissues surrounding wounds in order to facilitate good healing process    Time 4   Period Weeks   Status Partially Met   PT SHORT TERM GOAL #3   Title All wound dimensions to have decreased by at least 40% in order to demonstarte improved condition    Time 4   Period Weeks   Status Partially Met           PT Long Term Goals - 05/18/16 1605    PT LONG TERM GOAL #1   Title Patient to demonstrate full wound healing in all wounds   Time 8   Period Weeks   Status On-going   PT LONG TERM GOAL #2   Title Patient to be wearing compression at all times to assist in preventing recurrence of wounds in face of lymphedema co-morbidity    Time 8   Period Weeks   Status Achieved             Patient will benefit from skilled therapeutic intervention in order to improve the following deficits and impairments:     Visit Diagnosis: 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 Encounter for screening colonoscopy 10/07/2015  . Cellulitis of right lower extremity   . UTI (lower urinary tract infection) 02/03/2015  . Cellulitis 02/03/2015  . Blepharitis 02/03/2015  . Chronic anticoagulation 11/14/2014  . 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 Leg wound, left 08/19/2011  . DVT (deep venous thrombosis) ([place]) 06/15/2011  . H/O PE (pulmonary embolism) 06/15/2011  . 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 [place], [place], [address] Phone: [phone]   Fax:  [phone]  Name: YUG LORIA MRN: [id number] Date of Birth: October 25, 1961

## 2016-06-04 ENCOUNTER — Telehealth (HOSPITAL_COMMUNITY): Payer: Self-pay

## 2016-06-04 ENCOUNTER — Ambulatory Visit (HOSPITAL_COMMUNITY): Payer: Medicare Other | Attending: Hematology & Oncology

## 2016-06-04 DIAGNOSIS — S81801S Unspecified open wound, right lower leg, sequela: Secondary | ICD-10-CM | POA: Insufficient documentation

## 2016-06-04 DIAGNOSIS — S81802S Unspecified open wound, left lower leg, sequela: Secondary | ICD-10-CM | POA: Insufficient documentation

## 2016-06-04 DIAGNOSIS — I83012 Varicose veins of right lower extremity with ulcer of calf: Secondary | ICD-10-CM | POA: Insufficient documentation

## 2016-06-04 DIAGNOSIS — X58XXXS Exposure to other specified factors, sequela: Secondary | ICD-10-CM | POA: Insufficient documentation

## 2016-06-04 DIAGNOSIS — I83023 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: Secondary | ICD-10-CM | POA: Insufficient documentation

## 2016-06-04 DIAGNOSIS — M79661 Pain in right lower leg: Secondary | ICD-10-CM | POA: Insufficient documentation

## 2016-06-04 DIAGNOSIS — I83018 Varicose veins of right lower extremity with ulcer other part of lower leg: Secondary | ICD-10-CM | POA: Insufficient documentation

## 2016-06-04 NOTE — Telephone Encounter (Signed)
No show, tried to call, no answer or ability to leave message.    [address] [place]; [facility] [phone]

## 2016-06-05 ENCOUNTER — Ambulatory Visit: Payer: Medicare Other | Admitting: Urology

## 2016-06-09 ENCOUNTER — Ambulatory Visit (HOSPITAL_COMMUNITY): Payer: Medicare Other | Admitting: Physical Therapy

## 2016-06-09 DIAGNOSIS — M79661 Pain in right lower leg: Secondary | ICD-10-CM

## 2016-06-09 DIAGNOSIS — S81802S Unspecified open wound, left lower leg, sequela: Secondary | ICD-10-CM | POA: Diagnosis not present

## 2016-06-09 DIAGNOSIS — I83012 Varicose veins of right lower extremity with ulcer of calf: Secondary | ICD-10-CM

## 2016-06-09 DIAGNOSIS — L97219 Non-pressure chronic ulcer of right calf with unspecified severity: Principal | ICD-10-CM

## 2016-06-09 DIAGNOSIS — X58XXXS Exposure to other specified factors, sequela: Secondary | ICD-10-CM | POA: Diagnosis not present

## 2016-06-09 DIAGNOSIS — S81801S Unspecified open wound, right lower leg, sequela: Secondary | ICD-10-CM | POA: Diagnosis not present

## 2016-06-09 DIAGNOSIS — I83023 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: Secondary | ICD-10-CM | POA: Diagnosis not present

## 2016-06-09 DIAGNOSIS — L97309 Non-pressure chronic ulcer of unspecified ankle with unspecified severity: Secondary | ICD-10-CM

## 2016-06-09 DIAGNOSIS — I83018 Varicose veins of right lower extremity with ulcer other part of lower leg: Secondary | ICD-10-CM | POA: Diagnosis not present

## 2016-06-09 NOTE — Therapy (Signed)
[place], [place], [address] Phone: [phone]   Fax:  [phone]  Wound Care Therapy  Patient Details  Name: Matthew Wise MRN: [id number] Date of Birth: Sep 24, 1961 No Data Recorded  Encounter Date: 06/09/2016      PT End of Session - 06/09/16 1726    Visit Number 15   Number of Visits 24   Date for PT Re-Evaluation 07/07/16   Authorization Type medicare   Authorization Time Period cert expired 7/3, recert 4/16   Authorization - Visit Number 15   Authorization - Number of Visits 19   PT Start Time 1525   PT Stop Time 1635   PT Time Calculation (min) 70 min   Activity Tolerance Patient tolerated treatment well   Behavior During Therapy [facility] for tasks assessed/performed      Past Medical History  Diagnosis Date  . DVT of leg (deep venous thrombosis) (HCC)     rt. leg  . Inferior vena caval thrombosis (HCC)     Chronic  . DVT (deep venous thrombosis) ([place]) 06/15/2011  . H/O PE (pulmonary embolism) 06/15/2011  . 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 Chronic anticoagulation 11/14/2014    Past Surgical History  Procedure Laterality Date  . None to date  10/07/15  . Colonoscopy with propofol N/A 10/29/2015    Procedure: COLONOSCOPY WITH PROPOFOL;  Surgeon: Danie Binder, MD;  [facility];  Service: Endoscopy;  Laterality: N/A;  930    There were no vitals filed for this visit.       Subjective Assessment - 06/09/16 1721    Subjective PT states he went ahead and ordered the custom garments.                     Wound Therapy - 06/09/16 1721    Subjective Pt states he went ahead and ordered the custom garments.   Patient and Family Stated Goals wounds to heal    Pain Assessment No/denies pain   Wound Properties Date First Assessed: 04/01/16 Wound Type: Other (Comment) , R posterior  calf   Location: Tibial Location Orientation: Posterior;Right Wound Description (Comments): posterior R calf  Present on Admission: Yes   Dressing Type Compression wrap;Impregnated gauze (bismuth)   Dressing Changed Changed   Dressing Status None   Dressing Change Frequency PRN   % Wound base Red or Granulating 95%  was 50%    % Wound base Yellow 5%   Peri-wound Assessment Induration;Erythema (blanchable)   Closure None   Drainage Amount Minimal   Drainage Description Serosanguineous   Treatment Cleansed;Debridement (Selective)   Wound Properties Date First Assessed: 05/18/16 Time First Assessed: 1454 Wound Type: Other (Comment) Location: Leg Location Orientation: Left;Posterior Wound Description (Comments): .5 x 1.7 cm xeroform followed by short stretch bandages    Dressing Type Impregnated gauze (bismuth)   Dressing Changed Changed   Dressing Status Old drainage   Dressing Change Frequency PRN   Site / Wound Assessment Granulation tissue   % Wound base Red or Granulating 100%   Peri-wound Assessment Edema;Induration   Drainage Amount Scant   Drainage Description Serous   Treatment Cleansed;Debridement (Selective)   Selective Debridement - Location all wounds    Selective Debridement - Tools Used Forceps   Selective Debridement - Tissue Removed slough and dry skin    Wound Therapy - Clinical  Statement Wounds on anterior aspect now healed.  Overall skin integrity improving and able to debride more dry skin each visit.  continued with profore wrapping following massage, debridment and mositurizing.     Wound Therapy - Functional Problem List non healing wounds    Factors Delaying/Impairing Wound Healing Altered sensation;Diabetes Mellitus;Infection - systemic/local;Multiple medical problems;Vascular compromise   Hydrotherapy Plan Other (comment);Dressing change  complete decongestive therapy; aquire home pump    Wound Therapy - Frequency Other (comment)   Wound Therapy - Current  Recommendations PT   Wound Plan debridement to wounds as needed followed by decongestive therapy followed by appropriate dressings and compression   Manual Therapy short neck, deep and superficial abdominal.  Routing of fluid to bilateral inguinal and inguinal-axillary anastomosis                   PT Short Term Goals - 05/18/16 1605    PT SHORT TERM GOAL #1   Title Patient to present with 0% slough on all wounds and 100% red granulation tissue to demonstrate overall improvement of condition    Time 4   Period Weeks   Status Achieved   PT SHORT TERM GOAL #2   Title Patient to demonstarte improved moisture in tissues surrounding wounds in order to facilitate good healing process    Time 4   Period Weeks   Status Partially Met   PT SHORT TERM GOAL #3   Title All wound dimensions to have decreased by at least 40% in order to demonstarte improved condition    Time 4   Period Weeks   Status Partially Met           PT Long Term Goals - 05/18/16 1605    PT LONG TERM GOAL #1   Title Patient to demonstrate full wound healing in all wounds   Time 8   Period Weeks   Status On-going   PT LONG TERM GOAL #2   Title Patient to be wearing compression at all times to assist in preventing recurrence of wounds in face of lymphedema co-morbidity    Time 8   Period Weeks   Status Achieved               Plan - 06/09/16 1728    Rehab Potential Good   PT Treatment/Interventions Manual lymph drainage;Manual techniques;Compression bandaging;Other (comment)  debridement   PT Next Visit Plan continue with manual decompression and compression bandaging       Patient will benefit from skilled therapeutic intervention in order to improve the following deficits and impairments:  Pain, Increased edema, Other (comment)  Visit Diagnosis: 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 Encounter for screening colonoscopy 10/07/2015  . Cellulitis of right lower extremity   . UTI (lower urinary tract infection) 02/03/2015  . Cellulitis 02/03/2015  . Blepharitis 02/03/2015  . Chronic anticoagulation 11/14/2014  . 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 Leg wound, left 08/19/2011  . DVT (deep venous thrombosis) ([place]) 06/15/2011  . H/O PE (pulmonary embolism) 06/15/2011  . 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    Teena Irani,  PTA/CLT [phone]  06/09/2016, 5:32 PM  [place] [address] [place], [place], [address] Phone: [phone]   Fax:  [phone]  Name: Matthew Wise MRN: [id number] Date of Birth: 01/06/1961

## 2016-06-11 ENCOUNTER — Ambulatory Visit (HOSPITAL_COMMUNITY): Payer: Medicare Other | Admitting: Physical Therapy

## 2016-06-11 DIAGNOSIS — M79661 Pain in right lower leg: Secondary | ICD-10-CM

## 2016-06-11 DIAGNOSIS — S81802S Unspecified open wound, left lower leg, sequela: Secondary | ICD-10-CM | POA: Diagnosis not present

## 2016-06-11 DIAGNOSIS — I83023 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: Secondary | ICD-10-CM

## 2016-06-11 DIAGNOSIS — I83012 Varicose veins of right lower extremity with ulcer of calf: Secondary | ICD-10-CM

## 2016-06-11 DIAGNOSIS — L97329 Non-pressure chronic ulcer of left ankle with unspecified severity: Secondary | ICD-10-CM

## 2016-06-11 DIAGNOSIS — L97819 Non-pressure chronic ulcer of other part of right lower leg with unspecified severity: Secondary | ICD-10-CM

## 2016-06-11 DIAGNOSIS — S81801S Unspecified open wound, right lower leg, sequela: Secondary | ICD-10-CM

## 2016-06-11 DIAGNOSIS — L97309 Non-pressure chronic ulcer of unspecified ankle with unspecified severity: Secondary | ICD-10-CM

## 2016-06-11 DIAGNOSIS — L97219 Non-pressure chronic ulcer of right calf with unspecified severity: Principal | ICD-10-CM

## 2016-06-11 DIAGNOSIS — I83018 Varicose veins of right lower extremity with ulcer other part of lower leg: Secondary | ICD-10-CM | POA: Diagnosis not present

## 2016-06-11 NOTE — Therapy (Signed)
[place], [place], [address] Phone: [phone]   Fax:  [phone]  Wound Care Therapy  Patient Details  Name: Matthew Wise MRN: [id number] Date of Birth: 10/25/1961 No Data Recorded  Encounter Date: 06/11/2016      PT End of Session - 06/11/16 1733    Visit Number 16   Number of Visits 24   Date for PT Re-Evaluation 07/07/16   Authorization Type medicare   Authorization Time Period cert period 6/7-1/2   Authorization - Visit Number 16   Authorization - Number of Visits 19   PT Start Time 0950   PT Stop Time 1040   PT Time Calculation (min) 50 min   Activity Tolerance Patient tolerated treatment well   Behavior During Therapy [facility] for tasks assessed/performed      Past Medical History  Diagnosis Date  . DVT of leg (deep venous thrombosis) (HCC)     rt. leg  . Inferior vena caval thrombosis (HCC)     Chronic  . DVT (deep venous thrombosis) ([place]) 06/15/2011  . H/O PE (pulmonary embolism) 06/15/2011  . 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 Chronic anticoagulation 11/14/2014    Past Surgical History  Procedure Laterality Date  . None to date  10/07/15  . Colonoscopy with propofol N/A 10/29/2015    Procedure: COLONOSCOPY WITH PROPOFOL;  Surgeon: Danie Binder, MD;  [facility];  Service: Endoscopy;  Laterality: N/A;  930    There were no vitals filed for this visit.                  Wound Therapy - 06/11/16 1730    Subjective Pt states he should have his garments in the next week.     Patient and Family Stated Goals wounds to heal    Pain Assessment No/denies pain   Wound Properties Date First Assessed: 04/01/16 Wound Type: Other (Comment) , R posterior calf   Location: Tibial Location Orientation: Posterior;Right Wound Description (Comments): posterior R calf  Present on Admission:  Yes   Dressing Type Compression wrap;Impregnated gauze (bismuth)   Dressing Changed Changed   Dressing Status None   Dressing Change Frequency PRN   % Wound base Red or Granulating 95%  was 50%    % Wound base Yellow 5%   Peri-wound Assessment Induration;Erythema (blanchable)   Closure None   Drainage Amount Minimal   Drainage Description Serosanguineous   Treatment Cleansed;Debridement (Selective)   Wound Properties Date First Assessed: 05/18/16 Time First Assessed: 1454 Wound Type: Other (Comment) Location: Leg Location Orientation: Left;Posterior Wound Description (Comments): .5 x 1.7 cm xeroform followed by short stretch bandages    Dressing Type Impregnated gauze (bismuth)   Dressing Changed Changed   Dressing Status Old drainage   Dressing Change Frequency PRN   Site / Wound Assessment Granulation tissue   % Wound base Red or Granulating 100%   Peri-wound Assessment Edema;Induration   Drainage Amount Scant   Drainage Description Serous   Treatment Cleansed;Debridement (Selective)   Selective Debridement - Location posterior LE's   Selective Debridement - Tools Used Forceps   Selective Debridement - Tissue Removed slough and dry skin    Wound Therapy - Clinical Statement All wounds healed on Lt LE except on small wound posterior calf.  Rt posterior LE wounds healing nicely with more dry skin removed perimeter.  Continued  with xeroform on these areas.  Complete retro massage this session and moisturized well prior to rebandaging.    Wound Therapy - Functional Problem List non healing wounds    Factors Delaying/Impairing Wound Healing Altered sensation;Diabetes Mellitus;Infection - systemic/local;Multiple medical problems;Vascular compromise   Hydrotherapy Plan Other (comment);Dressing change  complete decongestive therapy; aquire home pump    Wound Therapy - Frequency Other (comment)   Wound Therapy - Current Recommendations PT   Wound Plan debridement to wounds as needed  followed by decongestive therapy followed by appropriate dressings and compression   Manual Therapy Retro massage                   PT Short Term Goals - 05/18/16 1605    PT SHORT TERM GOAL #1   Title Patient to present with 0% slough on all wounds and 100% red granulation tissue to demonstrate overall improvement of condition    Time 4   Period Weeks   Status Achieved   PT SHORT TERM GOAL #2   Title Patient to demonstarte improved moisture in tissues surrounding wounds in order to facilitate good healing process    Time 4   Period Weeks   Status Partially Met   PT SHORT TERM GOAL #3   Title All wound dimensions to have decreased by at least 40% in order to demonstarte improved condition    Time 4   Period Weeks   Status Partially Met           PT Long Term Goals - 05/18/16 1605    PT LONG TERM GOAL #1   Title Patient to demonstrate full wound healing in all wounds   Time 8   Period Weeks   Status On-going   PT LONG TERM GOAL #2   Title Patient to be wearing compression at all times to assist in preventing recurrence of wounds in face of lymphedema co-morbidity    Time 8   Period Weeks   Status Achieved             Patient will benefit from skilled therapeutic intervention in order to improve the following deficits and impairments:     Visit Diagnosis: 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 Encounter for screening colonoscopy 10/07/2015  . Cellulitis of right lower extremity   . UTI (lower urinary tract infection) 02/03/2015  . Cellulitis 02/03/2015  . Blepharitis 02/03/2015  . Chronic anticoagulation  11/14/2014  . 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 Leg wound, left 08/19/2011  . DVT (deep venous thrombosis) ([place]) 06/15/2011  . H/O PE (pulmonary embolism) 06/15/2011  . 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   Teena Irani, PTA/CLT [phone]  06/11/2016, 5:37 PM  [place] [address] [place], [place], [address] Phone: [phone]   Fax:  [phone]  Name: Matthew Wise MRN: [id number] Date of Birth: 10/12/1961

## 2016-06-17 ENCOUNTER — Ambulatory Visit (HOSPITAL_COMMUNITY): Payer: Medicare Other | Admitting: Physical Therapy

## 2016-06-17 DIAGNOSIS — I83012 Varicose veins of right lower extremity with ulcer of calf: Secondary | ICD-10-CM | POA: Diagnosis not present

## 2016-06-17 DIAGNOSIS — I83018 Varicose veins of right lower extremity with ulcer other part of lower leg: Secondary | ICD-10-CM | POA: Diagnosis not present

## 2016-06-17 DIAGNOSIS — L97819 Non-pressure chronic ulcer of other part of right lower leg with unspecified severity: Secondary | ICD-10-CM

## 2016-06-17 DIAGNOSIS — L97309 Non-pressure chronic ulcer of unspecified ankle with unspecified severity: Secondary | ICD-10-CM

## 2016-06-17 DIAGNOSIS — M79661 Pain in right lower leg: Secondary | ICD-10-CM | POA: Diagnosis not present

## 2016-06-17 DIAGNOSIS — S81802S Unspecified open wound, left lower leg, sequela: Secondary | ICD-10-CM | POA: Diagnosis not present

## 2016-06-17 DIAGNOSIS — I83023 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: Secondary | ICD-10-CM

## 2016-06-17 DIAGNOSIS — L97329 Non-pressure chronic ulcer of left ankle with unspecified severity: Secondary | ICD-10-CM

## 2016-06-17 DIAGNOSIS — L97219 Non-pressure chronic ulcer of right calf with unspecified severity: Principal | ICD-10-CM

## 2016-06-17 DIAGNOSIS — S81801S Unspecified open wound, right lower leg, sequela: Secondary | ICD-10-CM | POA: Diagnosis not present

## 2016-06-17 NOTE — Therapy (Signed)
[facility] [address] [place], [place], [address] Phone: [phone]   Fax:  [phone]  Physical Therapy Treatment  Patient Details  Name: Matthew Wise MRN: [id number] Date of Birth: 01/23/1961 No Data Recorded  Encounter Date: 06/17/2016      PT End of Session - 06/17/16 1211    Visit Number 17   Number of Visits 24   Date for PT Re-Evaluation 07/07/16   Authorization Type medicare   Authorization Time Period cert period 7/3-8/3   Authorization - Visit Number 17   Authorization - Number of Visits 19   PT Start Time 1035   PT Stop Time 1120   PT Time Calculation (min) 45 min   Activity Tolerance Patient tolerated treatment well   Behavior During Therapy WFL for tasks assessed/performed      Past Medical History  Diagnosis Date  . DVT of leg (deep venous thrombosis) (HCC)     rt. leg  . Inferior vena caval thrombosis (HCC)     Chronic  . DVT (deep venous thrombosis) (HCC) 06/15/2011  . H/O PE (pulmonary embolism) 06/15/2011  . 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 Chronic anticoagulation 11/14/2014    Past Surgical History  Procedure Laterality Date  . None to date  10/07/15  . Colonoscopy with propofol N/A 10/29/2015    Procedure: COLONOSCOPY WITH PROPOFOL;  Surgeon: Sandi L Fields, MD;  [facility];  Service: Endoscopy;  Laterality: N/A;  930    There were no vitals filed for this visit.      Subjective Assessment - 06/17/16 1151    Subjective PT states his garments will be here next Wednesday.  States his legs have been hurting more again and drainging has increased.    Currently in Pain? Yes   Pain Score 4    Pain Location Leg   Pain Orientation Right;Left;Posterior;Medial   Pain Descriptors / Indicators Discomfort   Pain Type Acute pain                       Wound Therapy - 06/17/16  1152    Subjective Pt states he should have his garments in the next week.     Patient and Family Stated Goals wounds to heal    Pain Score 4    Pain Type Acute pain   Pain Location Leg   Pain Orientation Right;Left;Posterior;Medial   Pain Descriptors / Indicators Discomfort   Wound Properties Date First Assessed: 04/01/16 Wound Type: Other (Comment) , R posterior calf   Location: Tibial Location Orientation: Posterior;Right Wound Description (Comments): posterior R calf  Present on Admission: Yes   Dressing Type Compression wrap;Impregnated gauze (bismuth)   Dressing Changed Changed   Dressing Status None   Dressing Change Frequency PRN   % Wound base Red or Granulating 95%  was 50%    % Wound base Yellow 5%   Peri-wound Assessment Induration;Erythema (blanchable)   Closure None   Drainage Amount Minimal   Drainage Description Serosanguineous   Treatment Cleansed;Debridement (Selective)   Wound Properties Date First Assessed: 05/18/16 Time First Assessed: 1454 Wound Type: Other (Comment) Location: Leg Location Orientation: Left;Posterior Wound Description (Comments): .5 x 1.7 cm xeroform followed by short stretch bandages    Dressing Type Impregnated gauze (bismuth)   Dressing Changed Changed   Dressing Status Old drainage   Dressing Change Frequency   PRN   Site / Wound Assessment Granulation tissue   % Wound base Red or Granulating 100%   Peri-wound Assessment Edema;Induration   Drainage Amount Minimal   Drainage Description Serous   Treatment Cleansed;Debridement (Selective)   Selective Debridement - Location posterior LE's   Selective Debridement - Tools Used Forceps   Selective Debridement - Tissue Removed slough and dry skin    Wound Therapy - Clinical Statement Wounds are more festered this session with increased drainage and induration noted.  Lt, which had only a small area remaining now has multiple draining areas.  Pt ensures he has been wearing compression and only  removes the profore after a couple days as it begins falling down.  Suggested we return to short stretch as it will remain intact and work better with decompression/getting fluid down.  Pt also reports he now has his pump and used it once over the weekend.  Pt educated that he could not use his pump over his compression garments. xeroform and profore used this session following debridment and clenasing of LE's.  Heavily moisturized..   Wound Therapy - Functional Problem List non healing wounds    Factors Delaying/Impairing Wound Healing Altered sensation;Diabetes Mellitus;Infection - systemic/local;Multiple medical problems;Vascular compromise   Hydrotherapy Plan Other (comment);Dressing change  complete decongestive therapy; aquire home pump    Wound Therapy - Frequency Other (comment)   Wound Therapy - Current Recommendations PT   Wound Plan debridement to wounds as needed followed by decongestive therapy followed by appropriate dressings and compression   Manual Therapy Retro massage                    PT Short Term Goals - 05/18/16 1605    PT SHORT TERM GOAL #1   Title Patient to present with 0% slough on all wounds and 100% red granulation tissue to demonstrate overall improvement of condition    Time 4   Period Weeks   Status Achieved   PT SHORT TERM GOAL #2   Title Patient to demonstarte improved moisture in tissues surrounding wounds in order to facilitate good healing process    Time 4   Period Weeks   Status Partially Met   PT SHORT TERM GOAL #3   Title All wound dimensions to have decreased by at least 40% in order to demonstarte improved condition    Time 4   Period Weeks   Status Partially Met           PT Long Term Goals - 05/18/16 1605    PT LONG TERM GOAL #1   Title Patient to demonstrate full wound healing in all wounds   Time 8   Period Weeks   Status On-going   PT LONG TERM GOAL #2   Title Patient to be wearing compression at all times to assist  in preventing recurrence of wounds in face of lymphedema co-morbidity    Time 8   Period Weeks   Status Achieved             Patient will benefit from skilled therapeutic intervention in order to improve the following deficits and impairments:     Visit Diagnosis: 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 Encounter for screening colonoscopy 10/07/2015  . Cellulitis of right lower extremity   . UTI (lower urinary tract infection) 02/03/2015  . Cellulitis 02/03/2015  . Blepharitis 02/03/2015  . Chronic anticoagulation 11/14/2014  . 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 Leg wound, left 08/19/2011  . DVT (deep venous thrombosis) (HCC) 06/15/2011  . H/O PE (pulmonary embolism) 06/15/2011  . 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     

## 2016-06-23 ENCOUNTER — Telehealth (HOSPITAL_COMMUNITY): Payer: Self-pay | Admitting: Physical Therapy

## 2016-06-23 ENCOUNTER — Ambulatory Visit (HOSPITAL_COMMUNITY): Payer: Medicare Other | Admitting: Physical Therapy

## 2016-06-23 DIAGNOSIS — S81801S Unspecified open wound, right lower leg, sequela: Secondary | ICD-10-CM

## 2016-06-23 DIAGNOSIS — I83023 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: Secondary | ICD-10-CM | POA: Diagnosis not present

## 2016-06-23 DIAGNOSIS — I83012 Varicose veins of right lower extremity with ulcer of calf: Secondary | ICD-10-CM | POA: Diagnosis not present

## 2016-06-23 DIAGNOSIS — S81802S Unspecified open wound, left lower leg, sequela: Secondary | ICD-10-CM

## 2016-06-23 DIAGNOSIS — I83018 Varicose veins of right lower extremity with ulcer other part of lower leg: Secondary | ICD-10-CM | POA: Diagnosis not present

## 2016-06-23 DIAGNOSIS — M79661 Pain in right lower leg: Secondary | ICD-10-CM | POA: Diagnosis not present

## 2016-06-23 NOTE — Therapy (Signed)
[place], [place], [address] Phone: [phone]   Fax:  [phone]  Wound Care Therapy  Patient Details  Name: Matthew Wise MRN: [id number] Date of Birth: [birth year] No Data Recorded  Encounter Date: 06/23/2016      PT End of Session - 06/23/16 1711    Visit Number 18   Number of Visits 24   Date for PT Re-Evaluation 07/07/16   Authorization Type medicare   Authorization Time Period cert period 7/5-1/7   Authorization - Visit Number 18   Authorization - Number of Visits 19   PT Start Time 0017   PT Stop Time 1343   PT Time Calculation (min) 40 min   Activity Tolerance Patient tolerated treatment well   Behavior During Therapy [facility] for tasks assessed/performed      Past Medical History:  Diagnosis Date  . Chronic anticoagulation 11/14/2014  . 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 DVT (deep venous thrombosis) ([place]) 06/15/2011  . DVT of leg (deep venous thrombosis) (HCC)    rt. leg  . H/O PE (pulmonary embolism) 06/15/2011  . 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 COLONOSCOPY WITH PROPOFOL N/A 10/29/2015   Procedure: COLONOSCOPY WITH PROPOFOL;  Surgeon: Danie Binder, MD;  [facility];  Service: Endoscopy;  Laterality: N/A;  930  . None to date  10/07/15    There were no vitals filed for this visit.                  Wound Therapy - 06/23/16 1704    Subjective Patient arrives stating that his legs are hurting him somewhat; he is a bit frustrated that he was doing so well and is now dealing with these wounds coming back again    Patient and Family Stated Goals wounds to heal    Pain Score 4    Pain Type Acute pain   Pain Location Leg   Pain Orientation Right;Left;Posterior   Pain Descriptors / Indicators Discomfort   Wound Properties Date First Assessed: 05/18/16 Time First Assessed: 1454 Wound Type: Other (Comment) Location: Leg Location Orientation: Left;Posterior Wound Description (Comments): .5 x 1.7 cm xeroform followed by short stretch bandages    Dressing Type Impregnated gauze (bismuth)   Dressing Changed Changed   Dressing Status Old drainage   Dressing Change Frequency PRN   Site / Wound Assessment Granulation tissue   % Wound base Red or Granulating 100%   Peri-wound Assessment Edema;Induration   Drainage Amount Minimal   Drainage Description Serous   Treatment Cleansed;Packing (Impregnated strip);Other (Comment)  profore, xeroform, vasoline    Wound Properties Date First Assessed: 04/01/16 Wound Type: Other (Comment) , R posterior calf   Location: Tibial Location Orientation: Posterior;Right Wound Description (Comments): posterior R calf  Present on Admission: Yes   Dressing Type Compression wrap;Impregnated gauze (bismuth)   Dressing Changed Changed   Dressing Status None   Dressing Change Frequency PRN   % Wound base Red or Granulating 95%   % Wound base Yellow 5%   Peri-wound Assessment Induration;Erythema (blanchable)   Closure None   Drainage Amount Minimal   Drainage Description Serosanguineous   Treatment Cleansed;Packing (Impregnated strip);Other (Comment)  profore, xeroform, vasoline    Wound Therapy - Clinical Statement Wounds continue to display induration and festering, skin  continues to be very dry with high potential for formation of more wounds. Cleansed, dressed all present wounds with xeroform and wound perimeters with vasoline, and covered with gauze before wrapping each LE with profore wrap kit. Did not perform decongestive therapy due to time constraints/therapist not lymph trained.     Wound Therapy - Functional Problem List non healing wounds    Factors Delaying/Impairing Wound Healing Altered sensation;Diabetes Mellitus;Infection - systemic/local;Multiple medical  problems;Vascular compromise   Hydrotherapy Plan Other (comment);Dressing change   Wound Therapy - Frequency Other (comment)   Wound Therapy - Current Recommendations PT   Wound Plan debridement to wounds as needed followed by decongestive therapy followed by appropriate dressings and compression                 PT Education - 06/23/16 1710    Education provided No          PT Short Term Goals - 05/18/16 1605      PT SHORT TERM GOAL #1   Title Patient to present with 0% slough on all wounds and 100% red granulation tissue to demonstrate overall improvement of condition    Time 4   Period Weeks   Status Achieved     PT SHORT TERM GOAL #2   Title Patient to demonstarte improved moisture in tissues surrounding wounds in order to facilitate good healing process    Time 4   Period Weeks   Status Partially Met     PT SHORT TERM GOAL #3   Title All wound dimensions to have decreased by at least 40% in order to demonstarte improved condition    Time 4   Period Weeks   Status Partially Met           PT Long Term Goals - 05/18/16 1605      PT LONG TERM GOAL #1   Title Patient to demonstrate full wound healing in all wounds   Time 8   Period Weeks   Status On-going     PT LONG TERM GOAL #2   Title Patient to be wearing compression at all times to assist in preventing recurrence of wounds in face of lymphedema co-morbidity    Time 8   Period Weeks   Status Achieved             Patient will benefit from skilled therapeutic intervention in order to improve the following deficits and impairments:     Visit Diagnosis: Unspecified open wound, right lower leg, sequela  Open wound, lower leg, left, sequela     Problem List Patient Active Problem List   Diagnosis Date Noted  . Encounter for screening colonoscopy 10/07/2015  . Cellulitis of right lower extremity   . UTI (lower urinary tract infection) 02/03/2015  . Cellulitis 02/03/2015  .  Blepharitis 02/03/2015  . Chronic anticoagulation 11/14/2014  . 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 Leg wound, left 08/19/2011  . DVT (deep venous thrombosis) ([place]) 06/15/2011  . H/O PE (pulmonary embolism) 06/15/2011  . 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 [place], [place], [address] Phone: [phone]   Fax:  [phone]  Name: Matthew Wise MRN: [id number] Date of Birth: 18-May-1961

## 2016-06-23 NOTE — Telephone Encounter (Signed)
Will fax [phone] eval notes for [facility] to order product for patient. NF 06/23/16

## 2016-06-25 ENCOUNTER — Telehealth (HOSPITAL_COMMUNITY): Payer: Self-pay

## 2016-06-25 ENCOUNTER — Ambulatory Visit (HOSPITAL_COMMUNITY): Payer: Medicare Other | Admitting: Physical Therapy

## 2016-06-29 ENCOUNTER — Ambulatory Visit (HOSPITAL_COMMUNITY): Payer: Medicare Other | Admitting: Physical Therapy

## 2016-06-29 DIAGNOSIS — L97219 Non-pressure chronic ulcer of right calf with unspecified severity: Secondary | ICD-10-CM

## 2016-06-29 DIAGNOSIS — S81802S Unspecified open wound, left lower leg, sequela: Secondary | ICD-10-CM | POA: Diagnosis not present

## 2016-06-29 DIAGNOSIS — I83023 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: Secondary | ICD-10-CM | POA: Diagnosis not present

## 2016-06-29 DIAGNOSIS — I83018 Varicose veins of right lower extremity with ulcer other part of lower leg: Secondary | ICD-10-CM | POA: Diagnosis not present

## 2016-06-29 DIAGNOSIS — S81801S Unspecified open wound, right lower leg, sequela: Secondary | ICD-10-CM

## 2016-06-29 DIAGNOSIS — L97329 Non-pressure chronic ulcer of left ankle with unspecified severity: Secondary | ICD-10-CM

## 2016-06-29 DIAGNOSIS — I83012 Varicose veins of right lower extremity with ulcer of calf: Secondary | ICD-10-CM

## 2016-06-29 DIAGNOSIS — M79661 Pain in right lower leg: Secondary | ICD-10-CM | POA: Diagnosis not present

## 2016-06-29 DIAGNOSIS — L97309 Non-pressure chronic ulcer of unspecified ankle with unspecified severity: Secondary | ICD-10-CM

## 2016-06-29 NOTE — Therapy (Signed)
[place], [place], [address] Phone: [phone]   Fax:  [phone]  Wound Care Therapy  Patient Details  Name: Matthew Wise MRN: [id number] Date of Birth: 11-01-1961 No Data Recorded  Encounter Date: 06/29/2016      PT End of Session - 06/29/16 1748    Visit Number 19   Number of Visits 24   Date for PT Re-Evaluation 07/07/16   Authorization Type medicare   Authorization Time Period cert period 1/3-2/4   Authorization - Visit Number 60   Authorization - Number of Visits 29   PT Start Time 1302   PT Stop Time 1352   PT Time Calculation (min) 50 min   Activity Tolerance Patient tolerated treatment well   Behavior During Therapy [facility] for tasks assessed/performed      Past Medical History:  Diagnosis Date  . Chronic anticoagulation 11/14/2014  . 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 DVT (deep venous thrombosis) ([place]) 06/15/2011  . DVT of leg (deep venous thrombosis) (HCC)    rt. leg  . H/O PE (pulmonary embolism) 06/15/2011  . 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 COLONOSCOPY WITH PROPOFOL N/A 10/29/2015   Procedure: COLONOSCOPY WITH PROPOFOL;  Surgeon: Danie Binder, MD;  [facility];  Service: Endoscopy;  Laterality: N/A;  930  . None to date  10/07/15    There were no vitals filed for this visit.                  Wound Therapy - 06/29/16 1734    Subjective Pt states his bandages fell after last session.  States he will bring his short stretch next session.    Patient and Family Stated Goals wounds to heal    Wound Properties Date First Assessed: 05/18/16 Time First Assessed: 1454 Wound Type: Other (Comment) Location: Leg Location Orientation: Left;Posterior Wound Description (Comments): .5 x 1.7 cm xeroform followed  by short stretch bandages    Dressing Type Impregnated gauze (bismuth)   Dressing Changed Changed   Dressing Status Old drainage   Dressing Change Frequency PRN   Site / Wound Assessment Granulation tissue   % Wound base Red or Granulating 90%   % Wound base Yellow 10%   Peri-wound Assessment Edema;Induration   Drainage Amount Minimal   Drainage Description Serous   Treatment Cleansed;Debridement (Selective)   Wound Properties Date First Assessed: 04/01/16 Wound Type: Other (Comment) , R posterior calf   Location: Tibial Location Orientation: Posterior;Right Wound Description (Comments): posterior R calf  Present on Admission: Yes   Dressing Type Compression wrap;Impregnated gauze (bismuth)   Dressing Changed Changed   Dressing Status None   Dressing Change Frequency PRN   % Wound base Red or Granulating 95%   % Wound base Yellow 5%   Peri-wound Assessment Induration;Erythema (blanchable)   Closure None   Drainage Amount Minimal   Drainage Description Serosanguineous   Treatment Cleansed;Debridement (Selective)   Selective Debridement - Location posterior LE's   Selective Debridement - Tools Used Forceps   Selective Debridement - Tissue Removed slough and dry skin    Wound Therapy - Clinical Statement Limited progression made with healing of posterior wounds.  Pt also with additional wound on medial aspect of Lt ankle measuring 1cm2.  Pt reports he keeps bandages  in place until the night before his next appointment, however unsure as to how long he is keeping compression in place.  Suggested patient bring his short stretch next session to change back to this to see if will progress wounds along.    Wound Therapy - Functional Problem List non healing wounds    Factors Delaying/Impairing Wound Healing Altered sensation;Diabetes Mellitus;Infection - systemic/local;Multiple medical problems;Vascular compromise   Hydrotherapy Plan Other (comment);Dressing change   Wound Therapy - Frequency  Other (comment)   Wound Therapy - Current Recommendations PT   Wound Plan debridement to wounds as needed followed by Compression bandaging.   Dressing  xeroform and profore compression bilaterally                   PT Short Term Goals - 05/18/16 1605      PT SHORT TERM GOAL #1   Title Patient to present with 0% slough on all wounds and 100% red granulation tissue to demonstrate overall improvement of condition    Time 4   Period Weeks   Status Achieved     PT SHORT TERM GOAL #2   Title Patient to demonstarte improved moisture in tissues surrounding wounds in order to facilitate good healing process    Time 4   Period Weeks   Status Partially Met     PT SHORT TERM GOAL #3   Title All wound dimensions to have decreased by at least 40% in order to demonstarte improved condition    Time 4   Period Weeks   Status Partially Met           PT Long Term Goals - 05/18/16 1605      PT LONG TERM GOAL #1   Title Patient to demonstrate full wound healing in all wounds   Time 8   Period Weeks   Status On-going     PT LONG TERM GOAL #2   Title Patient to be wearing compression at all times to assist in preventing recurrence of wounds in face of lymphedema co-morbidity    Time 8   Period Weeks   Status Achieved             Patient will benefit from skilled therapeutic intervention in order to improve the following deficits and impairments:     Visit Diagnosis: Unspecified open wound, right lower leg, sequela  Open wound, lower leg, left, sequela  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 Encounter for screening colonoscopy 10/07/2015  . Cellulitis of right lower extremity   . UTI (lower urinary tract infection) 02/03/2015  . Cellulitis 02/03/2015  . Blepharitis 02/03/2015  . Chronic  anticoagulation 11/14/2014  . 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 Leg wound, left 08/19/2011  . DVT (deep venous thrombosis) ([place]) 06/15/2011  . H/O PE (pulmonary embolism) 06/15/2011  . 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    Teena Irani, PTA/CLT [phone] 2016/07/22, 6:33 PM       G-Codes - 22-Jul-2016 1832    Functional Assessment Tool Used Based on wound status, presence of slough    Functional Limitation Mobility: Walking and moving around   Mobility: Walking and Moving Around Current Status [phone]) At least 40 percent but less than 60 percent  impaired, limited or restricted   Mobility: Walking and Moving Around Goal Status [phone]) At least 40 percent but less than 60 percent impaired, limited or restricted     Deniece Ree PT, DPT [place], [place], [address] Phone: [phone]   Fax:  [phone]  Name: GARED GILLIE MRN: [id number] Date of Birth: 30-Sep-1961

## 2016-07-02 ENCOUNTER — Ambulatory Visit (HOSPITAL_COMMUNITY): Payer: Medicare Other | Attending: Internal Medicine | Admitting: Physical Therapy

## 2016-07-02 DIAGNOSIS — I83012 Varicose veins of right lower extremity with ulcer of calf: Secondary | ICD-10-CM | POA: Insufficient documentation

## 2016-07-02 DIAGNOSIS — S81801S Unspecified open wound, right lower leg, sequela: Secondary | ICD-10-CM | POA: Diagnosis not present

## 2016-07-02 DIAGNOSIS — I83018 Varicose veins of right lower extremity with ulcer other part of lower leg: Secondary | ICD-10-CM | POA: Diagnosis not present

## 2016-07-02 DIAGNOSIS — I83023 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: Secondary | ICD-10-CM | POA: Diagnosis not present

## 2016-07-02 DIAGNOSIS — S81802S Unspecified open wound, left lower leg, sequela: Secondary | ICD-10-CM | POA: Diagnosis not present

## 2016-07-02 DIAGNOSIS — L97329 Non-pressure chronic ulcer of left ankle with unspecified severity: Secondary | ICD-10-CM

## 2016-07-02 DIAGNOSIS — X58XXXS Exposure to other specified factors, sequela: Secondary | ICD-10-CM | POA: Insufficient documentation

## 2016-07-02 DIAGNOSIS — L97309 Non-pressure chronic ulcer of unspecified ankle with unspecified severity: Secondary | ICD-10-CM

## 2016-07-02 DIAGNOSIS — M79661 Pain in right lower leg: Secondary | ICD-10-CM

## 2016-07-02 DIAGNOSIS — L97219 Non-pressure chronic ulcer of right calf with unspecified severity: Secondary | ICD-10-CM

## 2016-07-03 NOTE — Therapy (Signed)
[place], [place], [address] Phone: [phone]   Fax:  [phone]  Wound Care Therapy Reassess/GCode  Patient Details  Name: Matthew Wise MRN: [id number] Date of Birth: Mar 01, 1961 No Data Recorded  Encounter Date: 07/02/2016      PT End of Session - 07/03/16 0807    Visit Number 20   Number of Visits 32   Date for PT Re-Evaluation 08/02/16   Authorization Type medicare   Authorization Time Period cert 8/3 -3/55 G code done on visit 51    Authorization - Visit Number 20   Authorization - Number of Visits 32   PT Start Time 1300   PT Stop Time 1345   PT Time Calculation (min) 45 min   Activity Tolerance Patient tolerated treatment well   Behavior During Therapy [facility] for tasks assessed/performed      Past Medical History:  Diagnosis Date  . Chronic anticoagulation 11/14/2014  . 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 DVT (deep venous thrombosis) ([place]) 06/15/2011  . DVT of leg (deep venous thrombosis) ([facility])    rt. leg  . H/O PE (pulmonary embolism) 06/15/2011  . 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 COLONOSCOPY WITH PROPOFOL N/A 10/29/2015   Procedure: COLONOSCOPY WITH PROPOFOL;  Surgeon: Danie Binder, MD;  [facility];  Service: Endoscopy;  Laterality: N/A;  930  . None to date  10/07/15    There were no vitals filed for this visit.                  Wound Therapy - 07/02/16 1352    Subjective Pt states that he has recieved his custom garments as well as the pump but has not used them due to wounds being on his legs    Patient and Family Stated Goals wounds to heal    Pain Assessment No/denies pain   Pain Score 0-No pain   Wound Properties Date First Assessed: 05/18/16 Time First Assessed: 1454 Wound Type: Other  (Comment) Location: Leg Location Orientation: Left;Posterior Wound Description (Comments): .5 x 1.7 cm xeroform followed by short stretch bandages    Dressing Type --  no dressing on wounds as pt enters department.     Dressing Changed New   Dressing Status Old drainage   Dressing Change Frequency PRN   Site / Wound Assessment Dusky   % Wound base Red or Granulating 60%   % Wound base Yellow 40%   Peri-wound Assessment Edema;Induration   Wound Length (cm) 2 cm  inital evaluation was 1.2   Wound Width (cm) 1 cm  inital evaluation was 1.8   Wound Depth (cm) 0.4 cm  inital evaluation was .1   Undermining (cm) circumference of wound   Drainage Amount None   Treatment Cleansed;Debridement (Selective);Other (Comment)  multilayer compression dressing with foam   Wound Properties Date First Assessed: 04/01/16 Wound Type: Other (Comment) , R posterior calf   Location: Tibial Location Orientation: Posterior;Right Wound Description (Comments): posterior R calf  Present on Admission: Yes   Dressing Type Compression wrap;Impregnated gauze (bismuth)   Dressing Status None   Dressing Change Frequency PRN   % Wound base Red or Granulating 95%   % Wound base Yellow 5%   Peri-wound Assessment Induration;Erythema (blanchable)   Closure None  Drainage Amount Minimal   Drainage Description Serosanguineous   Treatment Cleansed;Debridement (Selective);Other (Comment)   Wound Properties Date First Assessed: 07/02/16 Time First Assessed: 1336 Wound Type: Other (Comment) Location: Leg Location Orientation: Left;Medial Present on Admission: Yes   Site / Wound Assessment Dusky   % Wound base Red or Granulating 50%   % Wound base Other (Comment) 50%  biofilm   Peri-wound Assessment Edema;Hemosiderin;Induration   Wound Length (cm) 1 cm  inital evaluation was 3.0   Wound Width (cm) 1 cm  initial evaluation was 2.0   Wound Depth (cm) 0.3 cm  inital evaluation was .1   Drainage Amount None   Treatment  Cleansed;Debridement (Selective);Other (Comment)  compression profore wrapping   Selective Debridement - Location both wounds and epiboled edges    Selective Debridement - Tools Used Forceps   Selective Debridement - Tissue Removed biofilm, slough and dry skin    Wound Therapy - Clinical Statement Pt LE have increased induration and the wound on the patients Left medial leg has returned.  Therapist cleansed, moisturized both LE.  Wounds debrided followed by application of honey, multilayer compression bandage with foam to right with profore to left.  Pt to increase to 90 minute treatment as soon as possible as he will benefit from decongestive manual techniques.    Wound Therapy - Functional Problem List non healing wounds    Factors Delaying/Impairing Wound Healing Altered sensation;Diabetes Mellitus;Infection - systemic/local;Multiple medical problems;Vascular compromise   Hydrotherapy Plan Other (comment);Dressing change   Wound Therapy - Frequency Other (comment)   Wound Therapy - Current Recommendations PT   Wound Plan debridement to wounds as needed followed by Compression bandaging.   Dressing  honey to wounds followed by 2x2 then compression dressing as above                    PT Short Term Goals - 07/03/16 0813      PT SHORT TERM GOAL #1   Title Patient to present with 0% slough on all wounds and 100% red granulation tissue to demonstrate overall improvement of condition    Time 4   Period Weeks   Status On-going     PT SHORT TERM GOAL #2   Title Patient to demonstarte improved moisture in tissues surrounding wounds in order to facilitate good healing process    Time 4   Period Weeks   Status Partially Met     PT SHORT TERM GOAL #3   Title All wound dimensions to have decreased by at least 40% in order to demonstarte improved condition    Time 4   Period Weeks   Status Partially Met           PT Long Term Goals - 07/03/16 0813      PT LONG TERM GOAL #1    Title Patient to demonstrate full wound healing in all wounds   Time 8   Period Weeks   Status Not Met     PT LONG TERM GOAL #2   Title Patient to be wearing compression at all times to assist in preventing recurrence of wounds in face of lymphedema co-morbidity    Time 8   Period Weeks   Status Not Met               Plan - 07/03/16 0808    Clinical Impression Statement Pt is a 55 yo male with chronic PVD, lymphedema causing chronic wounds.  He has obtained compression garments and  compression pump but he is unable to use them due to his chronic wounds. His lymphedema has increased in the past month due to the heat and humidity.  He will benefit from both wound care and total decongestive techniques and compression wrapping as his wounds will not heal until his induration decreases.     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 Encounter for screening colonoscopy 10/07/2015  . Cellulitis of right lower extremity   . UTI (lower urinary tract infection) 02/03/2015  . Cellulitis 02/03/2015  . Blepharitis 02/03/2015  . Chronic anticoagulation 11/14/2014  . 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 Leg wound, left 08/19/2011  . DVT (deep venous thrombosis) ([place]) 06/15/2011  . H/O PE (pulmonary embolism) 06/15/2011  . 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

## 2016-07-06 ENCOUNTER — Telehealth (HOSPITAL_COMMUNITY): Payer: Self-pay | Admitting: Physical Therapy

## 2016-07-06 ENCOUNTER — Ambulatory Visit (HOSPITAL_COMMUNITY): Payer: Medicare Other | Admitting: Physical Therapy

## 2016-07-06 NOTE — Telephone Encounter (Signed)
Pt is out of town with his daughter and will not make it back in time.

## 2016-07-09 ENCOUNTER — Ambulatory Visit (HOSPITAL_COMMUNITY): Payer: Medicare Other | Admitting: Physical Therapy

## 2016-07-09 DIAGNOSIS — I83018 Varicose veins of right lower extremity with ulcer other part of lower leg: Secondary | ICD-10-CM | POA: Diagnosis not present

## 2016-07-09 DIAGNOSIS — L97329 Non-pressure chronic ulcer of left ankle with unspecified severity: Secondary | ICD-10-CM

## 2016-07-09 DIAGNOSIS — S81801S Unspecified open wound, right lower leg, sequela: Secondary | ICD-10-CM | POA: Diagnosis not present

## 2016-07-09 DIAGNOSIS — L97309 Non-pressure chronic ulcer of unspecified ankle with unspecified severity: Secondary | ICD-10-CM

## 2016-07-09 DIAGNOSIS — S81802S Unspecified open wound, left lower leg, sequela: Secondary | ICD-10-CM | POA: Diagnosis not present

## 2016-07-09 DIAGNOSIS — I83023 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: Secondary | ICD-10-CM | POA: Diagnosis not present

## 2016-07-09 DIAGNOSIS — I83012 Varicose veins of right lower extremity with ulcer of calf: Secondary | ICD-10-CM

## 2016-07-09 DIAGNOSIS — L97219 Non-pressure chronic ulcer of right calf with unspecified severity: Secondary | ICD-10-CM

## 2016-07-09 DIAGNOSIS — M79661 Pain in right lower leg: Secondary | ICD-10-CM | POA: Diagnosis not present

## 2016-07-09 NOTE — Therapy (Signed)
[place], [place], [address] Phone: [phone]   Fax:  [phone]  Wound Care Therapy  Patient Details  Name: CARLESTER KASPAREK MRN: [id number] Date of Birth: [birth year] No Data Recorded  Encounter Date: 07/09/2016      PT End of Session - 07/09/16 1648    Visit Number 21   Number of Visits 32   Date for PT Re-Evaluation 08/02/16   Authorization Type medicare   Authorization Time Period cert 8/3 -1/06   Authorization - Visit Number 21   Authorization - Number of Visits 32   PT Start Time 1440   PT Stop Time 1520   PT Time Calculation (min) 40 min   Activity Tolerance Patient tolerated treatment well   Behavior During Therapy [facility] for tasks assessed/performed      Past Medical History:  Diagnosis Date  . Chronic anticoagulation 11/14/2014  . 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 DVT (deep venous thrombosis) ([place]) 06/15/2011  . DVT of leg (deep venous thrombosis) ([facility])    rt. leg  . H/O PE (pulmonary embolism) 06/15/2011  . 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 COLONOSCOPY WITH PROPOFOL N/A 10/29/2015   Procedure: COLONOSCOPY WITH PROPOFOL;  Surgeon: Danie Binder, MD;  [facility];  Service: Endoscopy;  Laterality: N/A;  930  . None to date  10/07/15    There were no vitals filed for this visit.                  Wound Therapy - 07/09/16 1632    Subjective Pt states his legs are feeling better.  States he's used his compression pump a couple times.   Patient and Family Stated Goals wounds to heal    Pain Assessment No/denies pain   Pain Score 0-No pain   Wound Properties Date First Assessed: 05/18/16 Time First Assessed: 1454 Wound Type: Other (Comment) Location: Leg Location Orientation: Left;Posterior Wound Description  (Comments): .5 x 1.7 cm xeroform followed by short stretch bandages    Dressing Type None  no dressing on wounds as pt enters department.     Dressing Changed New   Dressing Status Old drainage   Dressing Change Frequency PRN   Site / Wound Assessment Dusky   % Wound base Red or Granulating 90%   % Wound base Yellow 10%   Peri-wound Assessment Edema;Induration   Drainage Amount None   Treatment Cleansed;Debridement (Selective)   Wound Properties Date First Assessed: 04/01/16 Wound Type: Other (Comment) , R posterior calf   Location: Tibial Location Orientation: Posterior;Right Wound Description (Comments): posterior R calf  Present on Admission: Yes   Dressing Type None   Dressing Changed New   Dressing Status None   Dressing Change Frequency PRN   % Wound base Red or Granulating 90%   % Wound base Yellow 10%   Peri-wound Assessment Induration;Erythema (blanchable)   Closure None   Drainage Amount Minimal   Drainage Description Serosanguineous   Treatment Cleansed;Debridement (Selective)   Wound Properties Date First Assessed: 07/02/16 Time First Assessed: 1336 Wound Type: Other (Comment) Location: Leg Location Orientation: Left;Medial Present on Admission: Yes   Dressing Type None   Dressing Changed --   Dressing Change Frequency --   Site / Wound Assessment --   % Wound base  Red or Granulating 100%   % Wound base Other (Comment) --  biofilm   Peri-wound Assessment --   Drainage Amount None   Treatment --   Selective Debridement - Location both wounds and epiboled edges    Selective Debridement - Tools Used Forceps   Selective Debridement - Tissue Removed biofilm, slough and dry skin    Wound Therapy - Clinical Statement Improved appearance of LE's with overall reduction of induration.  Most distal wound on Lt LE near ankle is now healed and overall reduction in size and count of other wounds.  Debrided both areas well, cleansed and dressed with medihoney gel this visit.  Used  patients short stretch on Rt LE and profore system on Lt LE.     Wound Therapy - Functional Problem List non healing wounds    Factors Delaying/Impairing Wound Healing Altered sensation;Diabetes Mellitus;Infection - systemic/local;Multiple medical problems;Vascular compromise   Hydrotherapy Plan Other (comment);Dressing change   Wound Therapy - Frequency Other (comment)   Wound Therapy - Current Recommendations PT   Wound Plan debridement to wounds as needed followed by Compression bandaging.   Dressing  medihoney to wounds followed by 2x2 then compression dressing as above                    PT Short Term Goals - 07/03/16 0813      PT SHORT TERM GOAL #1   Title Patient to present with 0% slough on all wounds and 100% red granulation tissue to demonstrate overall improvement of condition    Time 4   Period Weeks   Status On-going     PT SHORT TERM GOAL #2   Title Patient to demonstarte improved moisture in tissues surrounding wounds in order to facilitate good healing process    Time 4   Period Weeks   Status Partially Met     PT SHORT TERM GOAL #3   Title All wound dimensions to have decreased by at least 40% in order to demonstarte improved condition    Time 4   Period Weeks   Status Partially Met           PT Long Term Goals - 07/03/16 0813      PT LONG TERM GOAL #1   Title Patient to demonstrate full wound healing in all wounds   Time 8   Period Weeks   Status Not Met     PT LONG TERM GOAL #2   Title Patient to be wearing compression at all times to assist in preventing recurrence of wounds in face of lymphedema co-morbidity    Time 8   Period Weeks   Status Not Met             Patient will benefit from skilled therapeutic intervention in order to improve the following deficits and impairments:     Visit Diagnosis: Unspecified open wound, right lower leg, sequela  Open wound, lower leg, left, sequela  Varicose veins of right lower  extremity with ulcer of calf (HCC)  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 [facility])     Problem List Patient Active Problem List   Diagnosis Date Noted  . Encounter for screening colonoscopy 10/07/2015  . Cellulitis of right lower extremity   . UTI (lower urinary tract infection) 02/03/2015  . Cellulitis 02/03/2015  . Blepharitis 02/03/2015  . Chronic anticoagulation 11/14/2014  . 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 Leg  wound, left 08/19/2011  . DVT (deep venous thrombosis) ([place]) 06/15/2011  . H/O PE (pulmonary embolism) 06/15/2011  . 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

## 2016-07-13 ENCOUNTER — Ambulatory Visit (HOSPITAL_COMMUNITY): Payer: Medicare Other | Admitting: Physical Therapy

## 2016-07-13 DIAGNOSIS — S81802S Unspecified open wound, left lower leg, sequela: Secondary | ICD-10-CM

## 2016-07-13 DIAGNOSIS — S81801S Unspecified open wound, right lower leg, sequela: Secondary | ICD-10-CM | POA: Diagnosis not present

## 2016-07-13 DIAGNOSIS — I83023 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: Secondary | ICD-10-CM | POA: Diagnosis not present

## 2016-07-13 DIAGNOSIS — M79661 Pain in right lower leg: Secondary | ICD-10-CM | POA: Diagnosis not present

## 2016-07-13 DIAGNOSIS — I83012 Varicose veins of right lower extremity with ulcer of calf: Secondary | ICD-10-CM | POA: Diagnosis not present

## 2016-07-13 DIAGNOSIS — L97219 Non-pressure chronic ulcer of right calf with unspecified severity: Secondary | ICD-10-CM

## 2016-07-13 DIAGNOSIS — L97309 Non-pressure chronic ulcer of unspecified ankle with unspecified severity: Secondary | ICD-10-CM

## 2016-07-13 DIAGNOSIS — I83018 Varicose veins of right lower extremity with ulcer other part of lower leg: Secondary | ICD-10-CM

## 2016-07-13 DIAGNOSIS — L97329 Non-pressure chronic ulcer of left ankle with unspecified severity: Secondary | ICD-10-CM

## 2016-07-13 DIAGNOSIS — L97819 Non-pressure chronic ulcer of other part of right lower leg with unspecified severity: Secondary | ICD-10-CM

## 2016-07-13 NOTE — Therapy (Signed)
[place], [place], [address] Phone: [phone]   Fax:  [phone]  Wound Care Therapy  Patient Details  Name: Matthew Wise MRN: [id number] Date of Birth: 20-Aug-1961 No Data Recorded  Encounter Date: 07/13/2016      PT End of Session - 07/13/16 1613    Visit Number 22   Number of Visits 32   Date for PT Re-Evaluation 08/02/16   Authorization Type medicare   Authorization Time Period cert 8/3 -0/31   Authorization - Visit Number 89   Authorization - Number of Visits 32   PT Start Time 1440   PT Stop Time 1640   PT Time Calculation (min) 120 min   Activity Tolerance Patient tolerated treatment well   Behavior During Therapy [facility] for tasks assessed/performed      Past Medical History:  Diagnosis Date  . Chronic anticoagulation 11/14/2014  . 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 DVT (deep venous thrombosis) ([place]) 06/15/2011  . DVT of leg (deep venous thrombosis) ([facility])    rt. leg  . H/O PE (pulmonary embolism) 06/15/2011  . 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 COLONOSCOPY WITH PROPOFOL N/A 10/29/2015   Procedure: COLONOSCOPY WITH PROPOFOL;  Surgeon: Danie Binder, MD;  [facility];  Service: Endoscopy;  Laterality: N/A;  930  . None to date  10/07/15    There were no vitals filed for this visit.                  Wound Therapy - 07/13/16 1602    Subjective Matthew Wise states that he has been using  the compression pump once a day on his left LE.  He has not been using it on his Rt LE due to still having a wound.  Pt did not bring his short stretch bandages in due to washing them and they were not dry yet    Patient and Family Stated Goals wounds to heal    Pain Assessment No/denies pain   Evaluation and Treatment  Procedures Explained to Patient/Family Yes   Evaluation and Treatment Procedures agreed to   Wound Properties Date First Assessed: 07/02/16 Time First Assessed: 1336 Wound Type: Other (Comment) Location: Leg Location Orientation: Left;Medial Present on Admission: Yes Final Assessment Date: 07/13/16 Final Assessment Time: 1456   Dressing Type None   % Wound base Red or Granulating 100%   % Wound base Other (Comment) --     Drainage Amount None   Wound Properties Date First Assessed: 05/18/16 Time First Assessed: 1454 Wound Type: Other (Comment) Location: Leg Location Orientation: Left;Posterior Wound Description (Comments): .5 x 1.7 cm xeroform followed by short stretch bandages  Final Assessment Date: 07/13/16 Final Assessment Time: 5945   Dressing Type None  no dressing on wounds as pt enters department.     Dressing Status None   Dressing Change Frequency PRN   Site / Wound Assessment Clean;Dry   % Wound base Red or Granulating 100%   % Wound base Yellow 0%   Peri-wound Assessment Edema;Induration   Drainage Amount None   Treatment Cleansed   Wound Properties Date First Assessed: 04/01/16 Wound Type: Other (Comment) , R posterior calf   Location: Tibial Location Orientation: Posterior;Right Wound Description (Comments): posterior R calf  Present on Admission: Yes  Final Assessment Date: Final Assessment Time:    Wound Properties Date First Assessed: 04/01/15 Time First Assessed: 1100 Wound Type: Other (Comment) Location: Leg Location Orientation: Right;Posterior Wound Description (Comments): Rt posterior mid calf region Present on Admission: Yes   Dressing Type --  comes with no dressing   Dressing Changed New   Dressing Change Frequency PRN   Site / Wound Assessment Dusky   % Wound base Red or Granulating 70%   % Wound base Other (Comment) 30%  dusky dried blood   Peri-wound Assessment Edema;Induration   Wound Length (cm) 1.2 cm   Wound Width (cm) 2.5 cm   Wound Depth (cm) 0.3 cm    Tunneling (cm) none   Undermining (cm) none   Drainage Amount None   Treatment Cleansed;Debridement (Selective);Other (Comment)  compression dressing using profore.    Selective Debridement - Location Rt posterior wound and epiboled edges    Selective Debridement - Tools Used Forceps   Selective Debridement - Tissue Removed biofilm, slough and dry skin    Wound Therapy - Clinical Statement Rt posterior wound is the only wound left.  Wound base is very dry used honey followed by 2x2 and paper tape prior to applying profore compression dressing.    Wound Therapy - Functional Problem List non healing wounds    Factors Delaying/Impairing Wound Healing Altered sensation;Diabetes Mellitus;Infection - systemic/local;Multiple medical problems;Vascular compromise   Hydrotherapy Plan Other (comment);Dressing change   Wound Therapy - Frequency Other (comment)   Wound Therapy - Current Recommendations PT   Wound Plan debridement to wounds as needed followed by Compression bandaging.   Dressing  medihoney to wound followed by 2x2 then compression dressing as above                    PT Short Term Goals - 07/13/16 1615      PT SHORT TERM GOAL #1   Title Patient to present with 0% slough on all wounds and 100% red granulation tissue to demonstrate overall improvement of condition    Time 4   Period Weeks   Status On-going     PT SHORT TERM GOAL #2   Title Patient to demonstarte improved moisture in tissues surrounding wounds in order to facilitate good healing process    Time 4   Period Weeks   Status Partially Met     PT SHORT TERM GOAL #3   Title All wound dimensions to have decreased by at least 40% in order to demonstarte improved condition    Time 4   Period Weeks   Status Partially Met           PT Long Term Goals - 07/13/16 1615      PT LONG TERM GOAL #1   Title Patient to demonstrate full wound healing in all wounds   Time 8   Period Weeks   Status Not Met      PT LONG TERM GOAL #2   Title Patient to be wearing compression at all times to assist in preventing recurrence of wounds in face of lymphedema co-morbidity    Time 8   Period Weeks   Status Not Met               Plan - 07/13/16 1614    Clinical Impression Statement Pt has recieved his compression garments but states that they are way to big for him.  [facility] is to come out to his home tomorrow and assess.  Rehab Potential Good   PT Frequency 2x / week   PT Duration 6 weeks   PT Treatment/Interventions Manual lymph drainage;Manual techniques;Compression bandaging;Other (comment)  debridement   PT Next Visit Plan continue with manual decompression and compression bandaging and wound care once pt brings bandages back      Patient will benefit from skilled therapeutic intervention in order to improve the following deficits and impairments:  Pain, Increased edema, Other (comment) (nonhealing wounds)  Visit Diagnosis: Unspecified open wound, right lower leg, sequela  Open wound, lower leg, left, sequela  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 Encounter for screening colonoscopy 10/07/2015  . Cellulitis of right lower extremity   . UTI (lower urinary tract infection) 02/03/2015  . Cellulitis 02/03/2015  . Blepharitis 02/03/2015  . Chronic anticoagulation 11/14/2014  . 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 Leg wound, left 08/19/2011  . DVT (deep venous thrombosis) ([place]) 06/15/2011  . H/O PE (pulmonary embolism) 06/15/2011  . 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

## 2016-07-15 ENCOUNTER — Ambulatory Visit (HOSPITAL_COMMUNITY): Payer: Medicare Other

## 2016-07-15 DIAGNOSIS — I83023 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: Secondary | ICD-10-CM | POA: Diagnosis not present

## 2016-07-15 DIAGNOSIS — S81801S Unspecified open wound, right lower leg, sequela: Secondary | ICD-10-CM

## 2016-07-15 DIAGNOSIS — I83018 Varicose veins of right lower extremity with ulcer other part of lower leg: Secondary | ICD-10-CM | POA: Diagnosis not present

## 2016-07-15 DIAGNOSIS — I83012 Varicose veins of right lower extremity with ulcer of calf: Secondary | ICD-10-CM

## 2016-07-15 DIAGNOSIS — M79661 Pain in right lower leg: Secondary | ICD-10-CM | POA: Diagnosis not present

## 2016-07-15 DIAGNOSIS — L97219 Non-pressure chronic ulcer of right calf with unspecified severity: Secondary | ICD-10-CM

## 2016-07-15 DIAGNOSIS — S81802S Unspecified open wound, left lower leg, sequela: Secondary | ICD-10-CM | POA: Diagnosis not present

## 2016-07-15 NOTE — Therapy (Signed)
[place], [place], [address] Phone: [phone]   Fax:  [phone]  Wound Care Therapy  Patient Details  Name: Matthew Wise MRN: [id number] Date of Birth: 1961/02/09 No Data Recorded  Encounter Date: 07/15/2016      PT End of Session - 07/15/16 1717    Visit Number 23   Number of Visits 32   Date for PT Re-Evaluation 08/02/16   Authorization Type medicare   Authorization Time Period cert 8/3 -5/64   Authorization - Visit Number 23   Authorization - Number of Visits 32   PT Start Time 1648   PT Stop Time 1708   PT Time Calculation (min) 20 min   Activity Tolerance Patient tolerated treatment well   Behavior During Therapy [facility] for tasks assessed/performed      Past Medical History:  Diagnosis Date  . Chronic anticoagulation 11/14/2014  . 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 DVT (deep venous thrombosis) ([place]) 06/15/2011  . DVT of leg (deep venous thrombosis) (HCC)    rt. leg  . H/O PE (pulmonary embolism) 06/15/2011  . 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 COLONOSCOPY WITH PROPOFOL N/A 10/29/2015   Procedure: COLONOSCOPY WITH PROPOFOL;  Surgeon: Danie Binder, MD;  [facility];  Service: Endoscopy;  Laterality: N/A;  930  . None to date  10/07/15    There were no vitals filed for this visit.       Subjective Assessment - 07/15/16 1710    Subjective Pt entered dep with garmets on, no reports of pain today.   Currently in Pain? No/denies                   Wound Therapy - 07/15/16 1711    Subjective Pt entered dep with garmets on, no reports of pain today.   Patient and Family Stated Goals wounds to heal    Pain Assessment No/denies pain   Evaluation and Treatment Procedures Explained to Patient/Family Yes    Evaluation and Treatment Procedures agreed to   Wound Properties Date First Assessed: 04/01/15 Time First Assessed: 1100 Wound Type: Other (Comment) Location: Leg Location Orientation: Right;Posterior Wound Description (Comments): Rt posterior mid calf region Present on Admission: Yes   Dressing Type --  arrived with garmet; applied medihoney, 2x2, skin tape   Dressing Changed Changed   Dressing Status Clean;Dry;Intact   Dressing Change Frequency PRN   Site / Wound Assessment Dusky   % Wound base Red or Granulating 70%   % Wound base Other (Comment) 30%  dry dusky blood   Drainage Amount Scant   Drainage Description Serosanguineous   Treatment Cleansed;Debridement (Selective)   Selective Debridement - Location Rt posterior wound and epiboled edges    Selective Debridement - Tools Used Forceps;Scalpel   Selective Debridement - Tissue Removed biofilm, slough and dry skin    Wound Therapy - Clinical Statement Wound base is very dry, continued with medihoney dressings following selective debridement for removal of biofilm, slough and dry skin perimeter of wound.  Pt re-applied his garmets.  Pt educated on importance of keeping skin moisturized to prevent additional wounds.  No reports of pain through session.   Wound Therapy - Functional Problem List non healing wounds    Factors Delaying/Impairing Wound Healing Altered sensation;Diabetes Mellitus;Infection -  systemic/local;Multiple medical problems;Vascular compromise   Wound Therapy - Frequency Other (comment)  2x/ week   Wound Therapy - Current Recommendations PT   Wound Plan debridement to wound Rt LE as needed followed by Compression bandaging.   Dressing  medihoney to wound followed by 2x2 then compression dressing as above                    PT Short Term Goals - 07/13/16 1615      PT SHORT TERM GOAL #1   Title Patient to present with 0% slough on all wounds and 100% red granulation tissue to demonstrate overall  improvement of condition    Time 4   Period Weeks   Status On-going     PT SHORT TERM GOAL #2   Title Patient to demonstarte improved moisture in tissues surrounding wounds in order to facilitate good healing process    Time 4   Period Weeks   Status Partially Met     PT SHORT TERM GOAL #3   Title All wound dimensions to have decreased by at least 40% in order to demonstarte improved condition    Time 4   Period Weeks   Status Partially Met           PT Long Term Goals - 07/13/16 1615      PT LONG TERM GOAL #1   Title Patient to demonstrate full wound healing in all wounds   Time 8   Period Weeks   Status Not Met     PT LONG TERM GOAL #2   Title Patient to be wearing compression at all times to assist in preventing recurrence of wounds in face of lymphedema co-morbidity    Time 8   Period Weeks   Status Not Met             Patient will benefit from skilled therapeutic intervention in order to improve the following deficits and impairments:     Visit Diagnosis: Unspecified open wound, right lower leg, sequela  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 Encounter for screening colonoscopy 10/07/2015  . Cellulitis of right lower extremity   . UTI (lower urinary tract infection) 02/03/2015  . Cellulitis 02/03/2015  . Blepharitis 02/03/2015  . Chronic anticoagulation 11/14/2014  . 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 Leg wound, left 08/19/2011  . DVT (deep venous thrombosis) ([place]) 06/15/2011  . H/O PE (pulmonary embolism) 06/15/2011  . 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 [place], [place],  [address] Phone: [phone]   Fax:  [phone]  Name: Matthew Wise MRN: [id number] Date of Birth: December 06, 1960

## 2016-07-16 ENCOUNTER — Ambulatory Visit (HOSPITAL_COMMUNITY): Payer: Medicare Other | Admitting: Physical Therapy

## 2016-07-21 ENCOUNTER — Ambulatory Visit (HOSPITAL_COMMUNITY): Payer: Medicare Other | Admitting: Physical Therapy

## 2016-07-21 DIAGNOSIS — L97309 Non-pressure chronic ulcer of unspecified ankle with unspecified severity: Secondary | ICD-10-CM

## 2016-07-21 DIAGNOSIS — M79661 Pain in right lower leg: Secondary | ICD-10-CM | POA: Diagnosis not present

## 2016-07-21 DIAGNOSIS — S81801S Unspecified open wound, right lower leg, sequela: Secondary | ICD-10-CM | POA: Diagnosis not present

## 2016-07-21 DIAGNOSIS — I83012 Varicose veins of right lower extremity with ulcer of calf: Secondary | ICD-10-CM

## 2016-07-21 DIAGNOSIS — I83018 Varicose veins of right lower extremity with ulcer other part of lower leg: Secondary | ICD-10-CM | POA: Diagnosis not present

## 2016-07-21 DIAGNOSIS — I83023 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: Secondary | ICD-10-CM

## 2016-07-21 DIAGNOSIS — S81802S Unspecified open wound, left lower leg, sequela: Secondary | ICD-10-CM | POA: Diagnosis not present

## 2016-07-21 DIAGNOSIS — L97219 Non-pressure chronic ulcer of right calf with unspecified severity: Secondary | ICD-10-CM

## 2016-07-21 NOTE — Therapy (Signed)
[place], [place], [address] Phone: [phone]   Fax:  [phone]  Wound Care Therapy  Patient Details  Name: Matthew Wise MRN: [id number] Date of Birth: [birth year] No Data Recorded  Encounter Date: 07/21/2016      PT End of Session - 07/21/16 1542    Visit Number 24   Number of Visits 32   Date for PT Re-Evaluation 08/02/16   Authorization Type medicare   Authorization Time Period cert 8/3 -4/19   Authorization - Visit Number 24   Authorization - Number of Visits 32   PT Start Time 1425   PT Stop Time 1500   PT Time Calculation (min) 35 min   Activity Tolerance Patient tolerated treatment well   Behavior During Therapy [facility] for tasks assessed/performed      Past Medical History:  Diagnosis Date  . Chronic anticoagulation 11/14/2014  . 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 DVT (deep venous thrombosis) ([place]) 06/15/2011  . DVT of leg (deep venous thrombosis) ([facility])    rt. leg  . H/O PE (pulmonary embolism) 06/15/2011  . 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 COLONOSCOPY WITH PROPOFOL N/A 10/29/2015   Procedure: COLONOSCOPY WITH PROPOFOL;  Surgeon: Danie Binder, MD;  [facility];  Service: Endoscopy;  Laterality: N/A;  930  . None to date  10/07/15    There were no vitals filed for this visit.                  Wound Therapy - 07/21/16 1531    Subjective Pt wearing his custom garments now and received his extra pair this morning.     Patient and Family Stated Goals wounds to heal    Wound Properties Date First Assessed: 04/01/15 Time First Assessed: 1100 Wound Type: Other (Comment) Location: Leg Location Orientation: Right;Posterior Wound Description (Comments): Rt posterior mid calf region Present on Admission: Yes   Dressing Type Compression wrap  arrived with garmet; applied medihoney, 2x2, skin tape   Dressing Changed Changed   Dressing Status Clean;Dry;Intact   Dressing Change Frequency PRN   Site / Wound Assessment Dusky   % Wound base Red or Granulating 90%   % Wound base Yellow 10%   % Wound base Other (Comment) --  dry dusky blood   Drainage Amount Scant   Drainage Description Serosanguineous   Treatment Cleansed;Debridement (Selective)   Selective Debridement - Location Rt posterior wound and epiboled edges    Selective Debridement - Tools Used Forceps;Scalpel   Selective Debridement - Tissue Removed biofilm, slough and dry skin    Wound Therapy - Clinical Statement All wounds on posterior Lt LE are now healed.  Posterior Rt LE with only one wound remaining of 1X1.5 cm.  Changed dressing to xeroform and covered with 2X2 and medipore.  Pt secured compression garment over bilateral LE's.     Wound Therapy - Functional Problem List non healing wounds    Factors Delaying/Impairing Wound Healing Altered sensation;Diabetes Mellitus;Infection - systemic/local;Multiple medical problems;Vascular compromise   Wound Therapy - Frequency Other (comment)  2x/ week   Wound Therapy - Current Recommendations PT   Wound Plan debridement to wound Rt LE as needed followed by Compression bandaging.   Dressing  xeroform, 2X2, medipore followed by donning of compression garment  PT Short Term Goals - 07/13/16 1615      PT SHORT TERM GOAL #1   Title Patient to present with 0% slough on all wounds and 100% red granulation tissue to demonstrate overall improvement of condition    Time 4   Period Weeks   Status On-going     PT SHORT TERM GOAL #2   Title Patient to demonstarte improved moisture in tissues surrounding wounds in order to facilitate good healing process    Time 4   Period Weeks   Status Partially Met     PT SHORT TERM GOAL #3   Title All wound dimensions to have  decreased by at least 40% in order to demonstarte improved condition    Time 4   Period Weeks   Status Partially Met           PT Long Term Goals - 07/13/16 1615      PT LONG TERM GOAL #1   Title Patient to demonstrate full wound healing in all wounds   Time 8   Period Weeks   Status Not Met     PT LONG TERM GOAL #2   Title Patient to be wearing compression at all times to assist in preventing recurrence of wounds in face of lymphedema co-morbidity    Time 8   Period Weeks   Status Not Met               Plan - 07/21/16 1547    Clinical Impression Statement Spoke to [facility] who discovered patient was putting garments on the wrong legs and thats why they fit incorrectly.  Pt reports good fit and comfort with his new garments.  LE's are no longer indurated as before and overall healing well.  Rt LE only has small wound remaining.  Now decreased to 45 minute treatments.   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 Encounter for screening colonoscopy 10/07/2015  . Cellulitis of right lower extremity   . UTI (lower urinary tract infection) 02/03/2015  . Cellulitis 02/03/2015  . Blepharitis 02/03/2015  . Chronic anticoagulation 11/14/2014  . 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 Leg wound, left 08/19/2011  . DVT (deep venous thrombosis) ([place]) 06/15/2011  . H/O PE (pulmonary embolism) 06/15/2011  . 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

## 2016-07-23 ENCOUNTER — Ambulatory Visit (HOSPITAL_COMMUNITY): Payer: Medicare Other | Admitting: Physical Therapy

## 2016-07-23 ENCOUNTER — Telehealth (HOSPITAL_COMMUNITY): Payer: Self-pay | Admitting: Physical Therapy

## 2016-07-23 DIAGNOSIS — I83012 Varicose veins of right lower extremity with ulcer of calf: Secondary | ICD-10-CM | POA: Diagnosis not present

## 2016-07-23 DIAGNOSIS — I83023 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: Secondary | ICD-10-CM

## 2016-07-23 DIAGNOSIS — S81801S Unspecified open wound, right lower leg, sequela: Secondary | ICD-10-CM | POA: Diagnosis not present

## 2016-07-23 DIAGNOSIS — L97219 Non-pressure chronic ulcer of right calf with unspecified severity: Secondary | ICD-10-CM

## 2016-07-23 DIAGNOSIS — M79661 Pain in right lower leg: Secondary | ICD-10-CM | POA: Diagnosis not present

## 2016-07-23 DIAGNOSIS — S81802S Unspecified open wound, left lower leg, sequela: Secondary | ICD-10-CM

## 2016-07-23 DIAGNOSIS — L97309 Non-pressure chronic ulcer of unspecified ankle with unspecified severity: Secondary | ICD-10-CM

## 2016-07-23 DIAGNOSIS — I83018 Varicose veins of right lower extremity with ulcer other part of lower leg: Secondary | ICD-10-CM | POA: Diagnosis not present

## 2016-07-23 NOTE — Telephone Encounter (Signed)
L/M requested return phone call so pt could accept the offer of moving his Monday apptment to 2:30. the slot is on hold for this pt we need to get 90 min wound pt in asap. NF 07/23/16

## 2016-07-23 NOTE — Therapy (Signed)
[place], [place], [address] Phone: [phone]   Fax:  [phone]  Wound Care Therapy  Patient Details  Name: MAGNUS CRESCENZO MRN: [id number] Date of Birth: 1961/07/12 No Data Recorded  Encounter Date: 07/23/2016      PT End of Session - 07/23/16 1636    Visit Number 25   Number of Visits 32   Date for PT Re-Evaluation 08/02/16   Authorization Type medicare   Authorization Time Period cert 8/3 -6/96   Authorization - Visit Number 25   Authorization - Number of Visits 32   PT Start Time 2952   PT Stop Time 1540   PT Time Calculation (min) 25 min   Activity Tolerance Patient tolerated treatment well   Behavior During Therapy [facility] for tasks assessed/performed      Past Medical History:  Diagnosis Date  . Chronic anticoagulation 11/14/2014  . 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 DVT (deep venous thrombosis) ([place]) 06/15/2011  . DVT of leg (deep venous thrombosis) ([facility])    rt. leg  . H/O PE (pulmonary embolism) 06/15/2011  . 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 COLONOSCOPY WITH PROPOFOL N/A 10/29/2015   Procedure: COLONOSCOPY WITH PROPOFOL;  Surgeon: Danie Binder, MD;  [facility];  Service: Endoscopy;  Laterality: N/A;  930  . None to date  10/07/15    There were no vitals filed for this visit.                  Wound Therapy - 07/23/16 1629    Subjective Pt without complaints.  Wearing his garments   Patient and Family Stated Goals wounds to heal    Wound Properties Date First Assessed: 04/01/15 Time First Assessed: 1100 Wound Type: Other (Comment) Location: Leg Location Orientation: Right;Posterior Wound Description (Comments): Rt posterior mid calf region Present on Admission: Yes   Dressing Type Compression wrap   arrived with garmet; applied medihoney, 2x2, skin tape   Dressing Changed Changed   Dressing Status Clean;Dry;Intact   Dressing Change Frequency PRN   Site / Wound Assessment Dusky   % Wound base Red or Granulating 95%   % Wound base Yellow 5%   % Wound base Other (Comment) --  dry dusky blood   Wound Length (cm) 1 cm   Wound Width (cm) 1.5 cm   Drainage Amount Scant   Drainage Description Serosanguineous   Treatment Cleansed;Debridement (Selective)   Selective Debridement - Location Rt posterior wound and epiboled edges    Selective Debridement - Tools Used Forceps;Scalpel   Selective Debridement - Tissue Removed biofilm, slough and dry skin    Wound Therapy - Clinical Statement Lt LE wounds continue to stay healed.  Posterior Rt LE with only one wound remaining of 1X1.5 cm.  Increased granulation present in wound today with suspected healing in the next couple weeks.     Wound Therapy - Functional Problem List non healing wounds    Factors Delaying/Impairing Wound Healing Altered sensation;Diabetes Mellitus;Infection - systemic/local;Multiple medical problems;Vascular compromise   Wound Therapy - Frequency Other (comment)  2x/ week   Wound Therapy - Current Recommendations PT   Wound Plan debridement to wound Rt LE as needed followed by Compression bandaging.   Dressing  xeroform, 2X2, medipore followed by donning of compression  garment                   PT Short Term Goals - 07/13/16 1615      PT SHORT TERM GOAL #1   Title Patient to present with 0% slough on all wounds and 100% red granulation tissue to demonstrate overall improvement of condition    Time 4   Period Weeks   Status On-going     PT SHORT TERM GOAL #2   Title Patient to demonstarte improved moisture in tissues surrounding wounds in order to facilitate good healing process    Time 4   Period Weeks   Status Partially Met     PT SHORT TERM GOAL #3   Title All wound dimensions to have decreased by  at least 40% in order to demonstarte improved condition    Time 4   Period Weeks   Status Partially Met           PT Long Term Goals - 07/13/16 1615      PT LONG TERM GOAL #1   Title Patient to demonstrate full wound healing in all wounds   Time 8   Period Weeks   Status Not Met     PT LONG TERM GOAL #2   Title Patient to be wearing compression at all times to assist in preventing recurrence of wounds in face of lymphedema co-morbidity    Time 8   Period Weeks   Status Not Met             Patient will benefit from skilled therapeutic intervention in order to improve the following deficits and impairments:     Visit Diagnosis: Unspecified open wound, right lower leg, sequela  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 Encounter for screening colonoscopy 10/07/2015  . Cellulitis of right lower extremity   . UTI (lower urinary tract infection) 02/03/2015  . Cellulitis 02/03/2015  . Blepharitis 02/03/2015  . Chronic anticoagulation 11/14/2014  . 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 Leg wound, left 08/19/2011  . DVT (deep venous thrombosis) ([place]) 06/15/2011  . H/O PE (pulmonary embolism) 06/15/2011  . 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 [place], [place], [address] Phone: [phone]   Fax:  [phone]  Name: TREVONN HALLUM MRN: [id number] Date of Birth: [birth year]

## 2016-07-27 ENCOUNTER — Other Ambulatory Visit (HOSPITAL_COMMUNITY): Payer: Medicare Other

## 2016-07-27 ENCOUNTER — Ambulatory Visit (HOSPITAL_COMMUNITY): Payer: Medicare Other | Admitting: Oncology

## 2016-07-27 ENCOUNTER — Encounter (HOSPITAL_COMMUNITY): Payer: Medicare Other | Attending: Hematology & Oncology

## 2016-07-27 ENCOUNTER — Ambulatory Visit (HOSPITAL_COMMUNITY): Payer: Medicare Other | Admitting: Physical Therapy

## 2016-07-27 DIAGNOSIS — L97309 Non-pressure chronic ulcer of unspecified ankle with unspecified severity: Secondary | ICD-10-CM

## 2016-07-27 DIAGNOSIS — I82401 Acute embolism and thrombosis of unspecified deep veins of right lower extremity: Secondary | ICD-10-CM | POA: Insufficient documentation

## 2016-07-27 DIAGNOSIS — I83023 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: Secondary | ICD-10-CM | POA: Diagnosis not present

## 2016-07-27 DIAGNOSIS — I82409 Acute embolism and thrombosis of unspecified deep veins of unspecified lower extremity: Secondary | ICD-10-CM

## 2016-07-27 DIAGNOSIS — D649 Anemia, unspecified: Secondary | ICD-10-CM

## 2016-07-27 DIAGNOSIS — I83012 Varicose veins of right lower extremity with ulcer of calf: Secondary | ICD-10-CM

## 2016-07-27 DIAGNOSIS — I83018 Varicose veins of right lower extremity with ulcer other part of lower leg: Secondary | ICD-10-CM | POA: Diagnosis not present

## 2016-07-27 DIAGNOSIS — S81801S Unspecified open wound, right lower leg, sequela: Secondary | ICD-10-CM

## 2016-07-27 DIAGNOSIS — S81802S Unspecified open wound, left lower leg, sequela: Secondary | ICD-10-CM

## 2016-07-27 DIAGNOSIS — L97219 Non-pressure chronic ulcer of right calf with unspecified severity: Secondary | ICD-10-CM

## 2016-07-27 DIAGNOSIS — M79661 Pain in right lower leg: Secondary | ICD-10-CM | POA: Diagnosis not present

## 2016-07-27 LAB — CBC WITH DIFFERENTIAL/PLATELET
BASOS PCT: 1 %
Basophils Absolute: 0 10*3/uL (ref 0.0–0.1)
EOS ABS: 0.1 10*3/uL (ref 0.0–0.7)
Eosinophils Relative: 2 %
HEMATOCRIT: 46.3 % (ref 39.0–52.0)
HEMOGLOBIN: 16.3 g/dL (ref 13.0–17.0)
Lymphocytes Relative: 41 %
Lymphs Abs: 2.2 10*3/uL (ref 0.7–4.0)
MCH: 31.2 pg (ref 26.0–34.0)
MCHC: 35.2 g/dL (ref 30.0–36.0)
MCV: 88.5 fL (ref 78.0–100.0)
MONOS PCT: 12 %
Monocytes Absolute: 0.6 10*3/uL (ref 0.1–1.0)
NEUTROS PCT: 44 %
Neutro Abs: 2.4 10*3/uL (ref 1.7–7.7)
Platelets: 180 10*3/uL (ref 150–400)
RBC: 5.23 MIL/uL (ref 4.22–5.81)
RDW: 14.3 % (ref 11.5–15.5)
WBC: 5.4 10*3/uL (ref 4.0–10.5)

## 2016-07-27 LAB — FERRITIN: Ferritin: 107 ng/mL (ref 24–336)

## 2016-07-27 NOTE — Therapy (Signed)
[place], [place], [address] Phone: [phone]   Fax:  [phone]  Wound Care Therapy  Patient Details  Name: Matthew Wise MRN: [id number] Date of Birth: 30-Jun-1961 No Data Recorded  Encounter Date: 07/27/2016      PT End of Session - 07/27/16 1718    Visit Number 26   Number of Visits 32   Date for PT Re-Evaluation 08/02/16   Authorization Type medicare   Authorization Time Period cert 8/3 -1/75   Authorization - Visit Number 33   Authorization - Number of Visits 32   Activity Tolerance Patient tolerated treatment well   Behavior During Therapy [facility] for tasks assessed/performed      Past Medical History:  Diagnosis Date  . Chronic anticoagulation 11/14/2014  . 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 DVT (deep venous thrombosis) ([place]) 06/15/2011  . DVT of leg (deep venous thrombosis) ([facility])    rt. leg  . H/O PE (pulmonary embolism) 06/15/2011  . 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 COLONOSCOPY WITH PROPOFOL N/A 10/29/2015   Procedure: COLONOSCOPY WITH PROPOFOL;  Surgeon: Danie Binder, MD;  [facility];  Service: Endoscopy;  Laterality: N/A;  930  . None to date  10/07/15    There were no vitals filed for this visit.                  Wound Therapy - 07/27/16 1709    Subjective Pt without complaints.  Wearing his garments   Patient and Family Stated Goals wounds to heal    Wound Properties Date First Assessed: 04/01/15 Time First Assessed: 1100 Wound Type: Other (Comment) Location: Leg Location Orientation: Right;Posterior Wound Description (Comments): Rt posterior mid calf region Present on Admission: Yes   Dressing Type Compression wrap   Dressing Changed Changed   Dressing Status Clean;Dry;Intact   Dressing Change  Frequency PRN   Site / Wound Assessment Dusky   % Wound base Red or Granulating 95%   % Wound base Yellow 5%   Drainage Amount Scant   Drainage Description Serosanguineous   Treatment Cleansed;Debridement (Selective)   Selective Debridement - Location Rt posterior wound and epiboled edges    Selective Debridement - Tools Used Forceps;Scalpel   Selective Debridement - Tissue Removed biofilm, slough and dry skin    Wound Therapy - Clinical Statement Wound continues to improve in granulation and approximation.  Pt remains compliant with compression garments.     Wound Therapy - Functional Problem List non healing wounds    Wound Plan debridement to wound Rt LE as needed followed by Compression bandaging.   Dressing  xeroform, 2X2, medipore followed by donning of compression garment                   PT Short Term Goals - 07/13/16 1615      PT SHORT TERM GOAL #1   Title Patient to present with 0% slough on all wounds and 100% red granulation tissue to demonstrate overall improvement of condition    Time 4   Period Weeks   Status On-going     PT SHORT TERM GOAL #2   Title Patient to demonstarte improved moisture in tissues surrounding wounds in order to facilitate good healing process    Time 4  Period Weeks   Status Partially Met     PT SHORT TERM GOAL #3   Title All wound dimensions to have decreased by at least 40% in order to demonstarte improved condition    Time 4   Period Weeks   Status Partially Met           PT Long Term Goals - 07/13/16 1615      PT LONG TERM GOAL #1   Title Patient to demonstrate full wound healing in all wounds   Time 8   Period Weeks   Status Not Met     PT LONG TERM GOAL #2   Title Patient to be wearing compression at all times to assist in preventing recurrence of wounds in face of lymphedema co-morbidity    Time 8   Period Weeks   Status Not Met             Patient will benefit from skilled therapeutic  intervention in order to improve the following deficits and impairments:     Visit Diagnosis: Unspecified open wound, right lower leg, sequela  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 Encounter for screening colonoscopy 10/07/2015  . Cellulitis of right lower extremity   . UTI (lower urinary tract infection) 02/03/2015  . Cellulitis 02/03/2015  . Blepharitis 02/03/2015  . Chronic anticoagulation 11/14/2014  . 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 Leg wound, left 08/19/2011  . DVT (deep venous thrombosis) ([place]) 06/15/2011  . H/O PE (pulmonary embolism) 06/15/2011  . 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 [place], [place], [address] Phone: [phone]   Fax:  [phone]  Name: Matthew Wise MRN: [id number] Date of Birth: 02-10-1961

## 2016-07-29 ENCOUNTER — Other Ambulatory Visit (HOSPITAL_COMMUNITY): Payer: Medicare Other

## 2016-07-29 ENCOUNTER — Encounter (HOSPITAL_BASED_OUTPATIENT_CLINIC_OR_DEPARTMENT_OTHER): Payer: Medicare Other | Admitting: Oncology

## 2016-07-29 VITALS — BP 150/94 | HR 62 | Temp 99.4°F | Resp 18 | Wt 295.8 lb

## 2016-07-29 DIAGNOSIS — I2699 Other pulmonary embolism without acute cor pulmonale: Secondary | ICD-10-CM | POA: Diagnosis not present

## 2016-07-29 DIAGNOSIS — I82503 Chronic embolism and thrombosis of unspecified deep veins of lower extremity, bilateral: Secondary | ICD-10-CM | POA: Diagnosis not present

## 2016-07-29 DIAGNOSIS — I82409 Acute embolism and thrombosis of unspecified deep veins of unspecified lower extremity: Secondary | ICD-10-CM

## 2016-07-29 DIAGNOSIS — Z7901 Long term (current) use of anticoagulants: Secondary | ICD-10-CM | POA: Diagnosis not present

## 2016-07-29 DIAGNOSIS — I82403 Acute embolism and thrombosis of unspecified deep veins of lower extremity, bilateral: Secondary | ICD-10-CM

## 2016-07-29 DIAGNOSIS — D573 Sickle-cell trait: Secondary | ICD-10-CM | POA: Diagnosis not present

## 2016-07-29 DIAGNOSIS — D649 Anemia, unspecified: Secondary | ICD-10-CM

## 2016-07-29 DIAGNOSIS — I82401 Acute embolism and thrombosis of unspecified deep veins of right lower extremity: Secondary | ICD-10-CM

## 2016-07-29 MED ORDER — NAPROXEN 500 MG PO TABS: 500.0000 mg | ORAL_TABLET | Freq: Two times a day (BID) | ORAL | 1 refills | Status: DC | PRN

## 2016-07-29 MED ORDER — OXYCODONE-ACETAMINOPHEN 5-325 MG PO TABS: 1.0000 | ORAL_TABLET | ORAL | 0 refills | Status: DC | PRN

## 2016-07-29 NOTE — Patient Instructions (Signed)
[place] at [facility] Discharge Instructions  RECOMMENDATIONS MADE BY THE CONSULTANT AND ANY TEST RESULTS WILL BE SENT TO YOUR REFERRING PHYSICIAN.  Refill pain meds (oxycodone/naprosyn)  Labs in 6 months  Return in 6 months for follow up  Thank you for choosing [place] at [facility] to provide your oncology and hematology care.  To afford each patient quality time with our provider, please arrive at least 15 minutes before your scheduled appointment time.   Beginning January 23rd 2017 lab work for the [employer] [employer] will be done in the  Main lab at [facility] on 1st floor. If you have a lab appointment with the [facility] please come in thru the  Main Entrance and check in at the main information desk  You need to re-schedule your appointment should you arrive 10 or more minutes late.  We strive to give you quality time with our providers, and arriving late affects you and other patients whose appointments are after yours.  Also, if you no show three or more times for appointments you may be dismissed from the clinic at the providers discretion.     Again, thank you for choosing [facility].  Our hope is that these requests will decrease the amount of time that you wait before being seen by our physicians.       _____________________________________________________________  Should you have questions after your visit to [facility], please contact our office at [phone] between the hours of 8:30 a.m. and 4:30 p.m.  Voicemails left after 4:30 p.m. will not be returned until the following business day.  For prescription refill requests, have your pharmacy contact our office.         Resources For Cancer Patients and their Caregivers ? American Cancer Society: Can assist with transportation, wigs, general needs, runs Look Good Feel Better.        (980)627-8883 ? Cancer Care: Provides financial  assistance, online support groups, medication/co-pay assistance.  1-800-813-HOPE 602-530-9353) ? [facility] Assists [place] cancer patients and their families through emotional , educational and financial support.  (336)500-1531 ? [place] DSS Where to apply for food stamps, Medicaid and utility assistance. 716-330-3179 ? [employer]: Transportation to medical appointments. 207-836-1264 ? Social Security Administration: May apply for disability if have a Stage IV cancer. (610)094-7048 (763) 496-6758 ? [employer], Disability and Transit Services: Assists with nutrition, care and transit needs. [place] Support Programs: @10RELATIVEDAYS @ > Cancer Support Group  2nd Tuesday of the month 1pm-2pm, Journey Room  > Creative Journey  3rd Tuesday of the month 1130am-1pm, Journey Room  > Look Good Feel Better  1st Wednesday of the month 10am-12 noon, Journey Room (Call [place] to register [phone])

## 2016-07-29 NOTE — Progress Notes (Signed)
Matthew Herring., MD [place] [id number]  Acute deep vein thrombosis (DVT) of right lower extremity, unspecified vein (HCC) - Plan: CBC with Differential, Basic metabolic panel, Ferritin, CANCELED: CBC with Differential, CANCELED: Basic metabolic panel, CANCELED: Ferritin  DVT (deep venous thrombosis), unspecified laterality - Plan: oxyCODONE-acetaminophen (PERCOCET/ROXICET) 5-325 MG tablet  Anemia, unspecified anemia type - Plan: oxyCODONE-acetaminophen (PERCOCET/ROXICET) 5-325 MG tablet  DVT (deep venous thrombosis), bilateral - Plan: naproxen (NAPROSYN) 500 MG tablet  CURRENT THERAPY: Lifelong anticoagulation, on Xarelto  INTERVAL HISTORY: Matthew Wise 55 y.o. 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 Patient seen by Dr. Christoper Allegra at [facility] who found that he also has sickle cell trait which increases the patient's risk of DVTs. He did not have cardiolipin antibodies, nor beta-2 glycoprotein antibodies. It is believed by both, Dr. Mare Ferrari and [place], that he is not a good candidate for ongoing work and is a candidate for permanent disability.     He denies any complaints and notes compliance with his anticoagulation.  Review of Systems  Constitutional: Negative.  Negative for chills, fever and weight loss.  HENT: Negative.   Eyes: Negative.   Respiratory: Negative.  Negative for cough and hemoptysis.   Cardiovascular: Positive for leg swelling (B/L). Negative for chest pain.  Gastrointestinal: Negative.  Negative for blood in stool and melena.  Genitourinary: Negative.   Musculoskeletal: Negative.   Skin: Negative.   Neurological: Negative.  Negative for weakness.  Endo/Heme/Allergies: Negative.   Psychiatric/Behavioral: Negative.     Past  Medical History:  Diagnosis Date  . Chronic anticoagulation 11/14/2014  . Congenital inferior vena cava interruption 06/15/2011  . 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 DVT (deep venous thrombosis) ([place]) 06/15/2011  . DVT of leg (deep venous thrombosis) (HCC)    rt. leg  . H/O PE (pulmonary embolism) 06/15/2011  . 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 COLONOSCOPY WITH PROPOFOL N/A 10/29/2015   Procedure: COLONOSCOPY WITH PROPOFOL;  Surgeon: Danie Binder, MD;  [facility];  Service: Endoscopy;  Laterality: N/A;  930  . None to date  10/07/15    Family History  Problem Relation Age of Onset  . COPD Father   . Stroke Brother   . Throat cancer Brother   . Colon cancer Neg Hx     Social History   Social History  . Marital status: Divorced    Spouse name: N/A  . Number of children: N/A  . Years of education: N/A   Social History Main Topics  . Smoking status: Current Every Day Smoker    Packs/day: 1.00    Years: 30.00    Types: Cigarettes  . Smokeless tobacco: Never Used  . Alcohol use 3.0 oz/week    5 Cans of beer per week     Comment: 2-3 beers a day  . Drug use: No     Comment: REMOTE HISTORY, Quit in 2007  . Sexual activity: Not on file   Other Topics Concern  . Not on file   Social History Narrative  . No narrative on file     PHYSICAL EXAMINATION  ECOG PERFORMANCE STATUS: 1 - Symptomatic but  completely ambulatory  Vitals:   07/29/16 1000  BP: (!) 150/94  Pulse: 62  Resp: 18  Temp: 99.4 F (37.4 C)    GENERAL:alert, no distress, well nourished, well developed, comfortable, cooperative, obese, smiling and unaccompanied SKIN: skin color, texture, turgor are normal, no rashes or significant lesions HEAD: Normocephalic, No masses, lesions, tenderness or abnormalities EYES: normal, EOMI, Conjunctiva are pink and non-injected EARS: External ears  normal OROPHARYNX:lips, buccal mucosa, and tongue normal and mucous membranes are moist  NECK: supple, no adenopathy, thyroid normal size, non-tender, without nodularity, trachea midline LYMPH:  no palpable lymphadenopathy BREAST:not examined LUNGS: clear to auscultation and percussion HEART: regular rate & rhythm, no murmurs, no gallops, S1 normal and S2 normal ABDOMEN:abdomen soft and normal bowel sounds BACK: Back symmetric, no curvature., No CVA tenderness EXTREMITIES:less then 2 second capillary refill, no joint deformities, effusion, or inflammation, no skin discoloration, no cyanosis, positive findings:  edema Bilateral LE edema with compression stockings in place.  NEURO: alert & oriented x 3 with fluent speech, no focal motor/sensory deficits, gait normal   LABORATORY DATA: CBC    Component Value Date/Time   WBC 5.4 07/27/2016 0956   RBC 5.23 07/27/2016 0956   HGB 16.3 07/27/2016 0956   HCT 46.3 07/27/2016 0956   PLT 180 07/27/2016 0956   MCV 88.5 07/27/2016 0956   MCH 31.2 07/27/2016 0956   MCHC 35.2 07/27/2016 0956   RDW 14.3 07/27/2016 0956   LYMPHSABS 2.2 07/27/2016 0956   MONOABS 0.6 07/27/2016 0956   EOSABS 0.1 07/27/2016 0956   BASOSABS 0.0 07/27/2016 0956      Chemistry      Component Value Date/Time   NA 137 10/23/2015 1500   K 4.0 10/23/2015 1500   CL 102 10/23/2015 1500   CO2 29 10/23/2015 1500   BUN 12 10/23/2015 1500   CREATININE 1.19 10/23/2015 1500      Component Value Date/Time   CALCIUM 9.4 10/23/2015 1500   ALKPHOS 72 05/22/2015 1235   AST 27 05/22/2015 1235   ALT 23 05/22/2015 1235   BILITOT 1.9 (H) 05/22/2015 1235     Lab Results  Component Value Date   FERRITIN 107 07/27/2016    PENDING LABS:   RADIOGRAPHIC STUDIES:  No results found.   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 Patient seen by Dr. Christoper Allegra at [facility] who found that he also has sickle cell trait which increases the patient's risk of DVTs. He did not have cardiolipin antibodies, nor beta-2 glycoprotein antibodies. It is believed by both, Dr. Mare Ferrari and [place], that he is not a good candidate for ongoing work and is a candidate for permanent disability.    Currently on Xarelto for supposed Coumadin failure (although compliance was more likely) when he developed a DVT in lower extremity while on Coumadin.   Labs performed on 07/27/16: CBC diff, ferritin.  I personally reviewed and went over laboratory results with the patient.  The results are noted within this dictation.  Labs in 6 months: CBC diff, BMET, ferritin.  Continue Xarelto daily as prescribed.  Compliance is encouraged.    I have refilled his pain medication and Naprosyn.  Additionally, I have reviewed the [place] regarding his pain medication.  It was  last filled in April 2017.  Continue follow-up with wound center.  His wounds are much improved.  Return in 6 months for follow-up.   ORDERS PLACED FOR THIS ENCOUNTER: Orders Placed This Encounter  Procedures  . CBC with Differential  . Basic metabolic panel  . Ferritin    MEDICATIONS PRESCRIBED THIS ENCOUNTER: Meds ordered this encounter  Medications  . oxyCODONE-acetaminophen (PERCOCET/ROXICET) 5-325 MG tablet    Sig: Take 1 tablet by mouth every 4 (four) hours as needed for severe pain.    Dispense:  30 tablet    Refill:  0    Order Specific Question:   Supervising Provider    Answer:   Patrici Ranks [id number]  . naproxen (NAPROSYN) 500 MG tablet    Sig: Take 1 tablet (500 mg total) by mouth 2 (two) times daily as needed for moderate pain.    Dispense:  45 tablet    Refill:  1    Order Specific Question:   Supervising Provider     Answer:   Patrici Ranks [id number]    THERAPY PLAN:  Continue anticoagulation lifelong.  All questions were answered. The patient knows to call the clinic with any problems, questions or concerns. We can certainly see the patient much sooner if necessary.  Patient and plan discussed with Dr. Ancil Linsey and she is in agreement with the aforementioned.   This note is electronically signed by: Robynn Pane, PA-C 07/29/2016 8:25 PM

## 2016-07-29 NOTE — Assessment & Plan Note (Addendum)
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 Patient seen by Dr. Christoper Allegra at [facility] who found that he also has sickle cell trait which increases the patient's risk of DVTs. He did not have cardiolipin antibodies, nor beta-2 glycoprotein antibodies. It is believed by both, Dr. Mare Ferrari and [place], that he is not a good candidate for ongoing work and is a candidate for permanent disability.    Currently on Xarelto for supposed Coumadin failure (although compliance was more likely) when he developed a DVT in lower extremity while on Coumadin.   Labs performed on 07/27/16: CBC diff, ferritin.  I personally reviewed and went over laboratory results with the patient.  The results are noted within this dictation.  Labs in 6 months: CBC diff, BMET, ferritin.  Continue Xarelto daily as prescribed.  Compliance is encouraged.    I have refilled his pain medication and Naprosyn.  Additionally, I have reviewed the [facility] regarding his pain medication.  It was last filled in April 2017.  Continue follow-up with wound center.  His wounds are much improved.  Return in 6 months for follow-up.

## 2016-07-30 ENCOUNTER — Ambulatory Visit (HOSPITAL_COMMUNITY): Payer: Medicare Other | Admitting: Physical Therapy

## 2016-07-30 DIAGNOSIS — I83023 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: Secondary | ICD-10-CM

## 2016-07-30 DIAGNOSIS — I83018 Varicose veins of right lower extremity with ulcer other part of lower leg: Secondary | ICD-10-CM | POA: Diagnosis not present

## 2016-07-30 DIAGNOSIS — I83012 Varicose veins of right lower extremity with ulcer of calf: Secondary | ICD-10-CM

## 2016-07-30 DIAGNOSIS — L97219 Non-pressure chronic ulcer of right calf with unspecified severity: Secondary | ICD-10-CM

## 2016-07-30 DIAGNOSIS — S81802S Unspecified open wound, left lower leg, sequela: Secondary | ICD-10-CM

## 2016-07-30 DIAGNOSIS — M79661 Pain in right lower leg: Secondary | ICD-10-CM | POA: Diagnosis not present

## 2016-07-30 DIAGNOSIS — S81801S Unspecified open wound, right lower leg, sequela: Secondary | ICD-10-CM | POA: Diagnosis not present

## 2016-07-30 DIAGNOSIS — L97309 Non-pressure chronic ulcer of unspecified ankle with unspecified severity: Secondary | ICD-10-CM

## 2016-07-30 NOTE — Therapy (Signed)
[place], [place], [address] Phone: [phone]   Fax:  [phone]  Wound Care Therapy  Patient Details  Name: Matthew Wise MRN: [id number] Date of Birth: 04/04/1961 No Data Recorded  Encounter Date: 07/30/2016      PT End of Session - 07/30/16 1617    Visit Number 27   Number of Visits 32   Date for PT Re-Evaluation 08/02/16   Authorization Type medicare   Authorization Time Period cert 8/3 -9/56   Authorization - Visit Number 68   Authorization - Number of Visits 32   PT Start Time 3875   PT Stop Time 1420   PT Time Calculation (min) 15 min   Activity Tolerance Patient tolerated treatment well   Behavior During Therapy [facility] for tasks assessed/performed      Past Medical History:  Diagnosis Date  . Chronic anticoagulation 11/14/2014  . Congenital inferior vena cava interruption 06/15/2011  . 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 DVT (deep venous thrombosis) ([place]) 06/15/2011  . DVT of leg (deep venous thrombosis) (HCC)    rt. leg  . H/O PE (pulmonary embolism) 06/15/2011  . 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 COLONOSCOPY WITH PROPOFOL N/A 10/29/2015   Procedure: COLONOSCOPY WITH PROPOFOL;  Surgeon: Danie Binder, MD;  [facility];  Service: Endoscopy;  Laterality: N/A;  930  . None to date  10/07/15    There were no vitals filed for this visit.                  Wound Therapy - 07/30/16 1614    Subjective Pt without complaints.  Wearing his garments   Pain Assessment No/denies pain   Wound Properties Date First Assessed: 04/01/15 Time First Assessed: 1100 Wound Type: Other (Comment) Location: Leg Location Orientation: Right;Posterior Wound Description (Comments): Rt posterior mid calf region Present on Admission: Yes   Dressing Type Compression wrap   Dressing Changed  Changed   Dressing Status Clean;Dry;Intact   Dressing Change Frequency PRN   Site / Wound Assessment Brown;Dry;Red   % Wound base Red or Granulating 100%   Wound Length (cm) 0.75 cm   Wound Width (cm) 1 cm   Wound Depth (cm) 0.3 cm   Drainage Amount Scant   Drainage Description Serosanguineous   Treatment Cleansed;Debridement (Selective)   Selective Debridement - Location Rt posterior wound and epiboled edges    Selective Debridement - Tools Used Forceps;Scalpel   Selective Debridement - Tissue Removed biofilm, slough and dry skin    Wound Therapy - Clinical Statement Pt continues to be compliant with compressino garments and overall doing well.  Still with one small wound posterior RT LE but 100% granulatied.  Irrigated and debrided with dressing including xeroform and medipore tape.  Pt reduced to 1X week woundcare at this time.   Wound Therapy - Frequency --  1X weekly   Wound Plan debridement to wound Rt LE as needed followed by Compression bandaging.   Dressing  xeroform, 2X2, medipore followed by donning of compression garment                   PT Short Term Goals - 07/13/16 1615      PT SHORT TERM GOAL #1   Title Patient to present with 0% slough on all wounds and 100% red granulation  tissue to demonstrate overall improvement of condition    Time 4   Period Weeks   Status On-going     PT SHORT TERM GOAL #2   Title Patient to demonstarte improved moisture in tissues surrounding wounds in order to facilitate good healing process    Time 4   Period Weeks   Status Partially Met     PT SHORT TERM GOAL #3   Title All wound dimensions to have decreased by at least 40% in order to demonstarte improved condition    Time 4   Period Weeks   Status Partially Met           PT Long Term Goals - 07/13/16 1615      PT LONG TERM GOAL #1   Title Patient to demonstrate full wound healing in all wounds   Time 8   Period Weeks   Status Not Met     PT LONG TERM GOAL  #2   Title Patient to be wearing compression at all times to assist in preventing recurrence of wounds in face of lymphedema co-morbidity    Time 8   Period Weeks   Status Not Met             Patient will benefit from skilled therapeutic intervention in order to improve the following deficits and impairments:     Visit Diagnosis: Unspecified open wound, right lower leg, sequela  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 Cellulitis of right lower extremity   . Cellulitis 02/03/2015  . Blepharitis 02/03/2015  . Chronic anticoagulation 11/14/2014  . 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 Leg wound, left 08/19/2011  . DVT (deep venous thrombosis) (HCC) 06/15/2011  . H/O PE (pulmonary embolism) 06/15/2011  . Congenital inferior vena cava interruption 06/15/2011  . 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     

## 2016-08-04 ENCOUNTER — Ambulatory Visit (HOSPITAL_COMMUNITY): Payer: Medicare Other | Admitting: Physical Therapy

## 2016-08-07 ENCOUNTER — Ambulatory Visit (HOSPITAL_COMMUNITY): Payer: Medicare Other | Attending: Internal Medicine

## 2016-08-07 DIAGNOSIS — S81801S Unspecified open wound, right lower leg, sequela: Secondary | ICD-10-CM | POA: Insufficient documentation

## 2016-08-07 DIAGNOSIS — M79661 Pain in right lower leg: Secondary | ICD-10-CM | POA: Insufficient documentation

## 2016-08-07 DIAGNOSIS — S81802S Unspecified open wound, left lower leg, sequela: Secondary | ICD-10-CM | POA: Insufficient documentation

## 2016-08-07 DIAGNOSIS — X58XXXS Exposure to other specified factors, sequela: Secondary | ICD-10-CM | POA: Insufficient documentation

## 2016-08-07 DIAGNOSIS — L97219 Non-pressure chronic ulcer of right calf with unspecified severity: Secondary | ICD-10-CM

## 2016-08-07 DIAGNOSIS — I83012 Varicose veins of right lower extremity with ulcer of calf: Secondary | ICD-10-CM | POA: Insufficient documentation

## 2016-08-07 NOTE — Therapy (Signed)
Matthew Wise, [place], [address] Phone: [phone]   Fax:  [phone]  Wound Care Therapy  Patient Details  Name: Matthew Wise MRN: [id number] Date of Birth: [birth year] No Data Recorded  Encounter Date: 08/07/2016      PT End of Session - 08/07/16 1334    Visit Number 28   Number of Visits 32   Date for PT Re-Evaluation 08/19/16   Authorization Type medicare   Authorization Time Period cert 8/3 -0/76   Authorization - Visit Number 28   Authorization - Number of Visits 32   PT Start Time 1038   PT Stop Time 1106   PT Time Calculation (min) 28 min   Activity Tolerance Patient tolerated treatment well   Behavior During Therapy [facility] for tasks assessed/performed      Past Medical History:  Diagnosis Date  . Chronic anticoagulation 11/14/2014  . 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 DVT (deep venous thrombosis) ([place]) 06/15/2011  . DVT of leg (deep venous thrombosis) (HCC)    rt. leg  . H/O PE (pulmonary embolism) 06/15/2011  . 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 COLONOSCOPY WITH PROPOFOL N/A 10/29/2015   Procedure: COLONOSCOPY WITH PROPOFOL;  Surgeon: Danie Binder, MD;  [facility];  Service: Endoscopy;  Laterality: N/A;  930  . None to date  10/07/15    There were no vitals filed for this visit.       Subjective Assessment - 08/07/16 1111    Subjective Pt entered dept with garmets on, reports he took shower and removed dressings prior session.  No reports of pain today.     Currently in Pain? No/denies                   Wound Therapy - 08/07/16 1112    Subjective Pt entered dept with garmets on, reports he took shower and removed dressings prior session.  No reports of pain today.     Patient and Family Stated Goals  wounds to heal    Date of Onset --  a couple weeks ago (04/01/2016)   Pain Assessment No/denies pain   Pain Score 0-No pain   Patients Stated Pain Goal 0   Evaluation and Treatment Procedures Explained to Patient/Family Yes   Evaluation and Treatment Procedures agreed to   Wound Properties Date First Assessed: 04/01/15 Time First Assessed: 1100 Wound Type: Other (Comment) Location: Leg Location Orientation: Right;Posterior Wound Description (Comments): Rt posterior mid calf region Present on Admission: Yes   Dressing Type Compression wrap  medihoney, moist to moist 2x2 (cut in half), medipore tape    Dressing Changed Changed   Dressing Status Clean;Dry;Intact   Dressing Change Frequency PRN   Site / Wound Assessment Dusky;Brown  very dry   % Wound base Red or Granulating 5%   % Wound base Other (Comment) 95%  dusky   Wound Length (cm) 1.1 cm  was .75 cm   Wound Width (cm) 1.6 cm  was 1 cm   Wound Depth (cm) 0.3 cm  was 0.3   Drainage Amount Scant   Drainage Description Serosanguineous   Treatment Cleansed;Debridement (Selective)   Selective Debridement - Location Rt posterior wound and epiboled edges    Selective Debridement - Tools Used Forceps;Scalpel   Selective  Debridement - Tissue Removed biofilm, slough and dry skin    Wound Therapy - Clinical Statement Pt entered dept wearing compression garmets though had no dressings on wound reports shower taken prior entrance.  Wound very dry and dusky today with epidole edges, selective debridement for removal of slough/adhesive dusky eschar.  Changed dressing to moist to moist 2x2 with medihoney and medipore tape with compression garmets.  Pt educated on importance of moisture to promote healing and encouraged to keep wound covered.  Measurements taken this session.     Wound Therapy - Functional Problem List non healing wounds    Factors Delaying/Impairing Wound Healing Altered sensation;Diabetes Mellitus;Infection -  systemic/local;Multiple medical problems;Vascular compromise   Hydrotherapy Plan Other (comment);Dressing change   Wound Therapy - Frequency --  1x/week   Wound Therapy - Current Recommendations PT   Wound Plan debridement to wound Rt LE as needed followed by Compression bandaging.   Dressing  medihoney, moist to moist 2x2 (cut in half), medipore tape and compression garmet       Wound has regressed.  Therapist stressed the importance of compression and dressing on the wound everyday.             PT Short Term Goals - 07/13/16 1615      PT SHORT TERM GOAL #1   Title Patient to present with 0% slough on all wounds and 100% red granulation tissue to demonstrate overall improvement of condition    Time 4   Period Weeks   Status On-going     PT SHORT TERM GOAL #2   Title Patient to demonstarte improved moisture in tissues surrounding wounds in order to facilitate good healing process    Time 4   Period Weeks   Status Partially Met     PT SHORT TERM GOAL #3   Title All wound dimensions to have decreased by at least 40% in order to demonstarte improved condition    Time 4   Period Weeks   Status Partially Met           PT Long Term Goals - 07/13/16 1615      PT LONG TERM GOAL #1   Title Patient to demonstrate full wound healing in all wounds   Time 8   Period Weeks   Status Not Met     PT LONG TERM GOAL #2   Title Patient to be wearing compression at all times to assist in preventing recurrence of wounds in face of lymphedema co-morbidity    Time 8   Period Weeks   Status Not Met             Patient will benefit from skilled therapeutic intervention in order to improve the following deficits and impairments:     Visit Diagnosis: Unspecified open wound, right lower leg, sequela  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 Cellulitis of right lower  extremity   . Cellulitis 02/03/2015  . Blepharitis 02/03/2015  . Chronic anticoagulation 11/14/2014  . 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 Leg wound, left 08/19/2011  . DVT (deep venous thrombosis) ([place]) 06/15/2011  . H/O PE (pulmonary embolism) 06/15/2011  . Congenital inferior vena cava interruption 06/15/2011  . 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

## 2016-08-10 ENCOUNTER — Ambulatory Visit (HOSPITAL_COMMUNITY): Payer: Medicare Other | Admitting: Physical Therapy

## 2016-08-13 ENCOUNTER — Ambulatory Visit (HOSPITAL_COMMUNITY): Payer: Medicare Other | Admitting: Physical Therapy

## 2016-08-13 DIAGNOSIS — S81801S Unspecified open wound, right lower leg, sequela: Secondary | ICD-10-CM

## 2016-08-13 DIAGNOSIS — M79661 Pain in right lower leg: Secondary | ICD-10-CM | POA: Diagnosis not present

## 2016-08-13 DIAGNOSIS — I83012 Varicose veins of right lower extremity with ulcer of calf: Secondary | ICD-10-CM

## 2016-08-13 DIAGNOSIS — S81802S Unspecified open wound, left lower leg, sequela: Secondary | ICD-10-CM | POA: Diagnosis not present

## 2016-08-13 DIAGNOSIS — X58XXXS Exposure to other specified factors, sequela: Secondary | ICD-10-CM | POA: Diagnosis not present

## 2016-08-13 DIAGNOSIS — L97219 Non-pressure chronic ulcer of right calf with unspecified severity: Secondary | ICD-10-CM

## 2016-08-13 NOTE — Therapy (Signed)
[place], [place], [address] Phone: [phone]   Fax:  [phone]  Wound Care Therapy  Patient Details  Name: Matthew Wise MRN: [id number] Date of Birth: 25-May-1961 No Data Recorded  Encounter Date: 08/13/2016      PT End of Session - 08/13/16 1346    Visit Number 29   Number of Visits 32   Date for PT Re-Evaluation 08/19/16   Authorization Type medicare   Authorization Time Period cert 8/3 -6/06   Authorization - Visit Number 102   Authorization - Number of Visits 32   PT Start Time 1300   PT Stop Time 1340   PT Time Calculation (min) 40 min   Activity Tolerance Patient tolerated treatment well   Behavior During Therapy [facility] for tasks assessed/performed      Past Medical History:  Diagnosis Date  . Chronic anticoagulation 11/14/2014  . 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 DVT (deep venous thrombosis) ([place]) 06/15/2011  . DVT of leg (deep venous thrombosis) ([facility])    rt. leg  . H/O PE (pulmonary embolism) 06/15/2011  . 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 COLONOSCOPY WITH PROPOFOL N/A 10/29/2015   Procedure: COLONOSCOPY WITH PROPOFOL;  Surgeon: Danie Binder, MD;  [facility];  Service: Endoscopy;  Laterality: N/A;  930  . None to date  10/07/15    There were no vitals filed for this visit.                  Wound Therapy - 08/13/16 1341    Subjective Pt has bandage and compression garment on Rt LE but normal sock on LT    Patient and Family Stated Goals wounds to heal    Date of Onset --  a couple weeks ago (04/01/2016)   Prior Treatments self care   Pain Assessment No/denies pain   Evaluation and Treatment Procedures Explained to Patient/Family Yes   Evaluation and Treatment Procedures agreed to   Wound  Properties Date First Assessed: 04/01/15 Time First Assessed: 1100 Wound Type: Other (Comment) Location: Leg Location Orientation: Right;Posterior Wound Description (Comments): Rt posterior mid calf region Present on Admission: Yes   Dressing Type Compression wrap  medihoney, moist to moist 2x2 (cut in half), medipore tape    Dressing Changed Changed   Dressing Status Old drainage   Dressing Change Frequency PRN   Site / Wound Assessment Dusky  very dry   % Wound base Red or Granulating 40%   % Wound base Other (Comment) 60%  dusky   Wound Length (cm) 1.2 cm   Wound Width (cm) 1.4 cm   Wound Depth (cm) 0.5 cm   Margins Epibole (rolled edges)   Drainage Amount Scant   Drainage Description Serosanguineous   Treatment Cleansed;Debridement (Selective);Other (Comment)  manual to decrease volume   Selective Debridement - Location Rt posterior wound and epiboled edges    Selective Debridement - Tools Used Forceps;Scalpel   Selective Debridement - Tissue Removed biofilm, slough and epibled edges   Wound Therapy - Clinical Statement Pt has increases swelling in LE.  Advised pt to bring back his short stretch bandages as his wound was healing at a much faster rate with this compression vs. his compression garment.    Wound Therapy - Functional Problem List  non healing wounds    Factors Delaying/Impairing Wound Healing Altered sensation;Diabetes Mellitus;Infection - systemic/local;Multiple medical problems;Vascular compromise   Hydrotherapy Plan Other (comment);Dressing change   Wound Therapy - Frequency --  1x/week   Wound Therapy - Current Recommendations PT   Wound Plan debridement to wound Rt LE as needed followed by Compression bandaging.   Dressing  medihoney, moist to moist 2x2 (cut in half), medipore tape and compression garmet                   PT Short Term Goals - 07/13/16 1615      PT SHORT TERM GOAL #1   Title Patient to present with 0% slough on all wounds and 100%  red granulation tissue to demonstrate overall improvement of condition    Time 4   Period Weeks   Status On-going     PT SHORT TERM GOAL #2   Title Patient to demonstarte improved moisture in tissues surrounding wounds in order to facilitate good healing process    Time 4   Period Weeks   Status Partially Met     PT SHORT TERM GOAL #3   Title All wound dimensions to have decreased by at least 40% in order to demonstarte improved condition    Time 4   Period Weeks   Status Partially Met           PT Long Term Goals - 07/13/16 1615      PT LONG TERM GOAL #1   Title Patient to demonstrate full wound healing in all wounds   Time 8   Period Weeks   Status Not Met     PT LONG TERM GOAL #2   Title Patient to be wearing compression at all times to assist in preventing recurrence of wounds in face of lymphedema co-morbidity    Time 8   Period Weeks   Status Not Met               Plan - 08/13/16 1346    Clinical Impression Statement See above    Rehab Potential Good   PT Frequency 2x / week   PT Duration 6 weeks   PT Treatment/Interventions Manual lymph drainage;Manual techniques;Compression bandaging;Other (comment)  debridement   PT Next Visit Plan continue with manual decompression and compression bandaging and wound care once pt brings bandages back      Patient will benefit from skilled therapeutic intervention in order to improve the following deficits and impairments:  Pain, Increased edema, Other (comment) (nonhealing wounds)  Visit Diagnosis: Unspecified open wound, right lower leg, sequela  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 Cellulitis of right lower extremity   . Cellulitis 02/03/2015  . Blepharitis 02/03/2015  . Chronic anticoagulation 11/14/2014  . 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 Leg wound, left 08/19/2011  . DVT (deep venous thrombosis) ([place]) 06/15/2011  . H/O PE (pulmonary embolism) 06/15/2011  . 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

## 2016-08-17 ENCOUNTER — Ambulatory Visit (HOSPITAL_COMMUNITY): Payer: Medicare Other | Admitting: Physical Therapy

## 2016-08-20 ENCOUNTER — Ambulatory Visit (HOSPITAL_COMMUNITY): Payer: Medicare Other | Admitting: Physical Therapy

## 2016-08-20 DIAGNOSIS — I83012 Varicose veins of right lower extremity with ulcer of calf: Secondary | ICD-10-CM | POA: Diagnosis not present

## 2016-08-20 DIAGNOSIS — S81801S Unspecified open wound, right lower leg, sequela: Secondary | ICD-10-CM

## 2016-08-20 DIAGNOSIS — M79661 Pain in right lower leg: Secondary | ICD-10-CM | POA: Diagnosis not present

## 2016-08-20 DIAGNOSIS — S81802S Unspecified open wound, left lower leg, sequela: Secondary | ICD-10-CM | POA: Diagnosis not present

## 2016-08-20 DIAGNOSIS — L97219 Non-pressure chronic ulcer of right calf with unspecified severity: Secondary | ICD-10-CM

## 2016-08-20 NOTE — Therapy (Signed)
[place], [place], [address] Phone: [phone]   Fax:  [phone]  Wound Care Therapy  Patient Details  Name: Matthew Wise MRN: [id number] Date of Birth: 24-Aug-1961 No Data Recorded  Encounter Date: 08/20/2016      PT End of Session - 08/20/16 1343    Visit Number 30   Number of Visits 34   Date for PT Re-Evaluation 08/19/16   Authorization Type medicare   Authorization Time Period 9/21- 10/21   Authorization - Visit Number 30   Authorization - Number of Visits 34   PT Start Time 2585   PT Stop Time 1334   PT Time Calculation (min) 31 min   Activity Tolerance Patient tolerated treatment well   Behavior During Therapy [facility] for tasks assessed/performed      Past Medical History:  Diagnosis Date  . Chronic anticoagulation 11/14/2014  . 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 DVT (deep venous thrombosis) ([place]) 06/15/2011  . DVT of leg (deep venous thrombosis) ([facility])    rt. leg  . H/O PE (pulmonary embolism) 06/15/2011  . 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 COLONOSCOPY WITH PROPOFOL N/A 10/29/2015   Procedure: COLONOSCOPY WITH PROPOFOL;  Surgeon: Danie Binder, MD;  [facility];  Service: Endoscopy;  Laterality: N/A;  930  . None to date  10/07/15    There were no vitals filed for this visit.                  Wound Therapy - 08/20/16 1337    Subjective Pt states no pain    Patient and Family Stated Goals wounds to heal    Date of Onset 03/14/16  a couple weeks ago (04/01/2016)   Prior Treatments self care   Pain Assessment No/denies pain   Evaluation and Treatment Procedures Explained to Patient/Family Yes   Evaluation and Treatment Procedures agreed to   Wound Properties Date First Assessed: 04/01/15 Time First  Assessed: 1100 Wound Type: Other (Comment) Location: Leg Location Orientation: Right;Posterior Wound Description (Comments): Rt posterior mid calf region Present on Admission: Yes   Dressing Type --  pt comes in with compression sock and bandaid on her leg    Dressing Changed Changed   Dressing Status Old drainage   Dressing Change Frequency PRN   Site / Wound Assessment Dusky  very dry   % Wound base Red or Granulating 50%   % Wound base Other (Comment) 50%  dusky   Wound Length (cm) 0.8 cm  was 1.2 on 9/14   Wound Width (cm) 1.2 cm  was 1.4   Wound Depth (cm) 0.4 cm  was .5    Margins Epibole (rolled edges)   Drainage Amount Scant   Drainage Description Serosanguineous   Treatment Cleansed;Debridement (Selective);Other (Comment)   Selective Debridement - Location Rt posterior wound and epiboled edges    Selective Debridement - Tools Used Forceps;Scalpel   Selective Debridement - Tissue Removed biofilm, slough and epibled edges   Wound Therapy - Clinical Statement Pt has increases swelling in LE.  Advised pt to bring back his short stretch bandages as his wound was healing at a much faster rate with this compression vs. his compression garment.  Pt wound is slowly filling in but remains dusky with significant depth.  Pt will continue to benefit from skilled therapy to create a healing environment and prevent cellulitis of LE.     Wound Therapy - Functional Problem List non healing wounds    Factors Delaying/Impairing Wound Healing Altered sensation;Diabetes Mellitus;Infection - systemic/local;Multiple medical problems;Vascular compromise   Hydrotherapy Plan Other (comment);Dressing change   Wound Therapy - Frequency --  1x/week x 4 more weeks    Wound Therapy - Current Recommendations PT   Wound Plan debridement to wound Rt LE as needed followed by Compression bandaging.   Dressing  medihoney, moist to moist 2x2 (cut in half), medipore tape and compression garmet   Manual Therapy  08/20/16   Manual Therapy supraclavicular, deep and superfical abdominal followed by inguinal/axillary anastomisis and LE .  Manual completed both anterior and posteriorly                 PT Education - 08/20/16 1343    Education provided Yes   Education Details to bring compression wrapping next treatment   Person(s) Educated Patient   Methods Explanation   Comprehension Verbalized understanding          PT Short Term Goals - 07/13/16 1615      PT SHORT TERM GOAL #1   Title Patient to present with 0% slough on all wounds and 100% red granulation tissue to demonstrate overall improvement of condition    Time 4   Period Weeks   Status On-going     PT SHORT TERM GOAL #2   Title Patient to demonstarte improved moisture in tissues surrounding wounds in order to facilitate good healing process    Time 4   Period Weeks   Status Partially Met     PT SHORT TERM GOAL #3   Title All wound dimensions to have decreased by at least 40% in order to demonstarte improved condition    Time 4   Period Weeks   Status Partially Met           PT Long Term Goals - 07/13/16 1615      PT LONG TERM GOAL #1   Title Patient to demonstrate full wound healing in all wounds   Time 8   Period Weeks   Status Not Met     PT LONG TERM GOAL #2   Title Patient to be wearing compression at all times to assist in preventing recurrence of wounds in face of lymphedema co-morbidity    Time 8   Period Weeks   Status Not Met               Plan - 08/20/16 1345    Clinical Impression Statement Wound is slowly closing.  Manual completed to decrease edema in LE after debridement.  Pt has had chronic wounds and will continue to benefit from skilled physical therapy to continue to create a wound healing envirionment so that his wounds close.     Rehab Potential Good   PT Frequency 2x / week   PT Duration 4 weeks   PT Treatment/Interventions Manual lymph drainage;Manual  techniques;Compression bandaging;Other (comment)  debridement   PT Next Visit Plan continue with manual decompression and compression bandaging and wound care once pt brings bandages back      Patient will benefit from skilled therapeutic intervention in order to improve the following deficits and impairments:  Pain, Increased edema, Other (comment) (nonhealing wounds)  Visit Diagnosis: Unspecified open wound, right lower leg, sequela  Varicose veins of right lower extremity with ulcer of  calf (HCC)  Pain in right lower leg      G-Codes - 24-Aug-2016 1425    Functional Assessment Tool Used granulation and size of wound    Functional Limitation Mobility: Walking and moving around   Mobility: Walking and Moving Around Current Status [phone]) At least 20 percent but less than 40 percent impaired, limited or restricted   Mobility: Walking and Moving Around Goal Status [phone]) At least 40 percent but less than 60 percent impaired, limited or restricted       Problem List Patient Active Problem List   Diagnosis Date Noted  . Cellulitis of right lower extremity   . Cellulitis 02/03/2015  . Blepharitis 02/03/2015  . Chronic anticoagulation 11/14/2014  . 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 Leg wound, left 08/19/2011  . DVT (deep venous thrombosis) ([place]) 06/15/2011  . H/O PE (pulmonary embolism) 06/15/2011  . 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 [place], [place], [address] Phone: [phone]   Fax:  [phone]  Name: Matthew Wise MRN: [id number] Date of Birth: [birth year]

## 2016-08-24 ENCOUNTER — Ambulatory Visit (HOSPITAL_COMMUNITY): Payer: Medicare Other | Admitting: Physical Therapy

## 2016-08-27 ENCOUNTER — Ambulatory Visit (HOSPITAL_COMMUNITY): Payer: Medicare Other | Admitting: Physical Therapy

## 2016-08-27 DIAGNOSIS — S81802S Unspecified open wound, left lower leg, sequela: Secondary | ICD-10-CM | POA: Diagnosis not present

## 2016-08-27 DIAGNOSIS — M79661 Pain in right lower leg: Secondary | ICD-10-CM | POA: Diagnosis not present

## 2016-08-27 DIAGNOSIS — I83012 Varicose veins of right lower extremity with ulcer of calf: Secondary | ICD-10-CM | POA: Diagnosis not present

## 2016-08-27 DIAGNOSIS — L97219 Non-pressure chronic ulcer of right calf with unspecified severity: Secondary | ICD-10-CM

## 2016-08-27 DIAGNOSIS — S81801S Unspecified open wound, right lower leg, sequela: Secondary | ICD-10-CM

## 2016-08-27 NOTE — Therapy (Signed)
[place], [place], [address] Phone: [phone]   Fax:  [phone]  Wound Care Therapy  Patient Details  Name: Matthew Wise MRN: [id number] Date of Birth: 1961-03-22 No Data Recorded  Encounter Date: 08/27/2016      PT End of Session - 08/27/16 1709    Visit Number 31   Number of Visits 34   Date for PT Re-Evaluation 08/19/16   Authorization Type medicare   Authorization Time Period 9/21- 10/21   Authorization - Visit Number 35   Authorization - Number of Visits 34   PT Start Time 1007   PT Stop Time 1645   PT Time Calculation (min) 37 min   Activity Tolerance Patient tolerated treatment well   Behavior During Therapy [facility] for tasks assessed/performed      Past Medical History:  Diagnosis Date  . Chronic anticoagulation 11/14/2014  . 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 DVT (deep venous thrombosis) ([place]) 06/15/2011  . DVT of leg (deep venous thrombosis) ([facility])    rt. leg  . H/O PE (pulmonary embolism) 06/15/2011  . 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 COLONOSCOPY WITH PROPOFOL N/A 10/29/2015   Procedure: COLONOSCOPY WITH PROPOFOL;  Surgeon: Danie Binder, MD;  [facility];  Service: Endoscopy;  Laterality: N/A;  930  . None to date  10/07/15    There were no vitals filed for this visit.                  Wound Therapy - 08/27/16 1705    Subjective no pain.  brough his short stretch with him today.   Patient and Family Stated Goals wounds to heal    Date of Onset 03/14/16  a couple weeks ago (04/01/2016)   Prior Treatments self care   Pain Assessment No/denies pain   Wound Properties Date First Assessed: 04/01/15 Time First Assessed: 1100 Wound Type: Other (Comment) Location: Leg Location Orientation:  Right;Posterior Wound Description (Comments): Rt posterior mid calf region Present on Admission: Yes   Dressing Type --  pt comes in with compression sock and bandaid on her leg    Dressing Changed Changed   Dressing Status Old drainage   Dressing Change Frequency PRN   Site / Wound Assessment Dusky  very dry   % Wound base Red or Granulating 70%   % Wound base Other (Comment) 30%  dusky   Margins Epibole (rolled edges)   Drainage Amount Scant   Drainage Description Serosanguineous   Treatment Cleansed;Debridement (Selective)   Selective Debridement - Location Rt posterior wound and epiboled edges    Selective Debridement - Tools Used Forceps;Scalpel   Selective Debridement - Tissue Removed biofilm, slough and epibled edges   Wound Therapy - Clinical Statement Resumed short stretch bandaging following cleansing and debridement this session.  Advised patient not to use bandaids any more as tissue beneath was macerated..  Used medihoney gel with 2X2 and medipore beneath the bandaging..  Pt reported overall comfort.   Wound Therapy - Functional Problem List non healing wounds    Factors Delaying/Impairing Wound Healing Altered sensation;Diabetes Mellitus;Infection - systemic/local;Multiple medical problems;Vascular compromise   Hydrotherapy Plan Other (comment);Dressing change   Wound Therapy - Frequency --  1x/week   Wound Therapy - Current Recommendations PT   Wound  Plan debridement to wound Rt LE as needed followed by Compression bandaging.   Dressing  medihoney, moist to moist 2x2 (cut in half), medipore tape and compression garmet   Manual Therapy --   Manual Therapy --                   PT Short Term Goals - 07/13/16 1615      PT SHORT TERM GOAL #1   Title Patient to present with 0% slough on all wounds and 100% red granulation tissue to demonstrate overall improvement of condition    Time 4   Period Weeks   Status On-going     PT SHORT TERM GOAL #2   Title  Patient to demonstarte improved moisture in tissues surrounding wounds in order to facilitate good healing process    Time 4   Period Weeks   Status Partially Met     PT SHORT TERM GOAL #3   Title All wound dimensions to have decreased by at least 40% in order to demonstarte improved condition    Time 4   Period Weeks   Status Partially Met           PT Long Term Goals - 07/13/16 1615      PT LONG TERM GOAL #1   Title Patient to demonstrate full wound healing in all wounds   Time 8   Period Weeks   Status Not Met     PT LONG TERM GOAL #2   Title Patient to be wearing compression at all times to assist in preventing recurrence of wounds in face of lymphedema co-morbidity    Time 8   Period Weeks   Status Not Met               Plan - 08/27/16 1711    Rehab Potential Good   PT Frequency 2x / week   PT Duration 4 weeks   PT Treatment/Interventions Manual lymph drainage;Manual techniques;Compression bandaging;Other (comment)  debridement   PT Next Visit Plan continue with manual decompression and compression bandaging and wound care once pt brings bandages back      Patient will benefit from skilled therapeutic intervention in order to improve the following deficits and impairments:  Pain, Increased edema, Other (comment) (nonhealing wounds)  Visit Diagnosis: Unspecified open wound, right lower leg, sequela  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 Cellulitis of right lower extremity   . Cellulitis 02/03/2015  . Blepharitis 02/03/2015  . Chronic anticoagulation 11/14/2014  . 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 Leg wound, left 08/19/2011  . DVT (deep venous thrombosis) ([place]) 06/15/2011  . H/O PE (pulmonary embolism) 06/15/2011  . Congenital  inferior vena cava interruption 06/15/2011  . 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

## 2016-09-03 ENCOUNTER — Ambulatory Visit (HOSPITAL_COMMUNITY): Payer: Medicare Other | Attending: Internal Medicine | Admitting: Physical Therapy

## 2016-09-03 DIAGNOSIS — I83023 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: Secondary | ICD-10-CM | POA: Diagnosis not present

## 2016-09-03 DIAGNOSIS — X58XXXS Exposure to other specified factors, sequela: Secondary | ICD-10-CM | POA: Diagnosis not present

## 2016-09-03 DIAGNOSIS — S81801S Unspecified open wound, right lower leg, sequela: Secondary | ICD-10-CM | POA: Insufficient documentation

## 2016-09-03 DIAGNOSIS — S81802S Unspecified open wound, left lower leg, sequela: Secondary | ICD-10-CM | POA: Diagnosis not present

## 2016-09-03 DIAGNOSIS — M79661 Pain in right lower leg: Secondary | ICD-10-CM | POA: Diagnosis not present

## 2016-09-03 DIAGNOSIS — I83018 Varicose veins of right lower extremity with ulcer other part of lower leg: Secondary | ICD-10-CM | POA: Insufficient documentation

## 2016-09-03 DIAGNOSIS — L97219 Non-pressure chronic ulcer of right calf with unspecified severity: Secondary | ICD-10-CM

## 2016-09-03 DIAGNOSIS — I83012 Varicose veins of right lower extremity with ulcer of calf: Secondary | ICD-10-CM | POA: Insufficient documentation

## 2016-09-03 NOTE — Therapy (Signed)
[place], [place], [address] Phone: [phone]   Fax:  [phone]  Wound Care Therapy  Patient Details  Name: Matthew Wise MRN: [id number] Date of Birth: [birth year] No Data Recorded  Encounter Date: 09/03/2016      PT End of Session - 09/03/16 1743    Visit Number 32   Number of Visits 34   Date for PT Re-Evaluation 09/18/16   Authorization Type medicare   Authorization Time Period 9/21- 10/21   Authorization - Visit Number 8   Authorization - Number of Visits 34   PT Start Time 6389   PT Stop Time 1720   PT Time Calculation (min) 30 min   Activity Tolerance Patient tolerated treatment well   Behavior During Therapy [facility] for tasks assessed/performed      Past Medical History:  Diagnosis Date  . Chronic anticoagulation 11/14/2014  . 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 DVT (deep venous thrombosis) ([place]) 06/15/2011  . DVT of leg (deep venous thrombosis) (HCC)    rt. leg  . H/O PE (pulmonary embolism) 06/15/2011  . 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 COLONOSCOPY WITH PROPOFOL N/A 10/29/2015   Procedure: COLONOSCOPY WITH PROPOFOL;  Surgeon: Danie Binder, MD;  [facility];  Service: Endoscopy;  Laterality: N/A;  930  . None to date  10/07/15    There were no vitals filed for this visit.                  Wound Therapy - 09/03/16 1741    Subjective No pain, forgot his short stretch.   Patient and Family Stated Goals wounds to heal    Date of Onset 03/14/16  a couple weeks ago (04/01/2016)   Prior Treatments self care   Pain Assessment No/denies pain   Wound Properties Date First Assessed: 04/01/15 Time First Assessed: 1100 Wound Type: Other (Comment) Location: Leg Location Orientation: Right;Posterior Wound  Description (Comments): Rt posterior mid calf region Present on Admission: Yes   Dressing Type --  pt comes in with compression sock and bandaid on her leg    Dressing Changed Changed   Dressing Status Old drainage   Dressing Change Frequency PRN   Site / Wound Assessment Dusky  very dry   % Wound base Red or Granulating 80%   % Wound base Other (Comment) 20%  dusky   Margins Epibole (rolled edges)   Drainage Amount Scant   Drainage Description Serosanguineous   Treatment Cleansed;Debridement (Selective)   Selective Debridement - Location Rt posterior wound and epiboled edges    Selective Debridement - Tools Used Forceps;Scalpel   Selective Debridement - Tissue Removed biofilm, slough and epibled edges   Wound Therapy - Clinical Statement Larger wound appears smaller today and with increased granulation.  Massaged and moisturzied prior to redressing using profore system.  Changed to xeroform this session.     Wound Therapy - Functional Problem List non healing wounds    Factors Delaying/Impairing Wound Healing Altered sensation;Diabetes Mellitus;Infection - systemic/local;Multiple medical problems;Vascular compromise   Hydrotherapy Plan Other (comment);Dressing change   Wound Therapy - Frequency --  1x/week   Wound Therapy - Current Recommendations PT   Wound Plan debridement to wound Rt LE as needed followed by Compression bandaging.   Dressing  xeroform,  gauze,profore                   PT Short Term Goals - 07/13/16 1615      PT SHORT TERM GOAL #1   Title Patient to present with 0% slough on all wounds and 100% red granulation tissue to demonstrate overall improvement of condition    Time 4   Period Weeks   Status On-going     PT SHORT TERM GOAL #2   Title Patient to demonstarte improved moisture in tissues surrounding wounds in order to facilitate good healing process    Time 4   Period Weeks   Status Partially Met     PT SHORT TERM GOAL #3   Title All wound  dimensions to have decreased by at least 40% in order to demonstarte improved condition    Time 4   Period Weeks   Status Partially Met           PT Long Term Goals - 07/13/16 1615      PT LONG TERM GOAL #1   Title Patient to demonstrate full wound healing in all wounds   Time 8   Period Weeks   Status Not Met     PT LONG TERM GOAL #2   Title Patient to be wearing compression at all times to assist in preventing recurrence of wounds in face of lymphedema co-morbidity    Time 8   Period Weeks   Status Not Met             Patient will benefit from skilled therapeutic intervention in order to improve the following deficits and impairments:     Visit Diagnosis: Unspecified open wound, right lower leg, sequela  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 Cellulitis of right lower extremity   . Cellulitis 02/03/2015  . Blepharitis 02/03/2015  . Chronic anticoagulation 11/14/2014  . 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 Leg wound, left 08/19/2011  . DVT (deep venous thrombosis) ([place]) 06/15/2011  . H/O PE (pulmonary embolism) 06/15/2011  . 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

## 2016-09-09 ENCOUNTER — Ambulatory Visit (HOSPITAL_COMMUNITY): Payer: Medicare Other | Admitting: Physical Therapy

## 2016-09-09 DIAGNOSIS — L97219 Non-pressure chronic ulcer of right calf with unspecified severity: Secondary | ICD-10-CM

## 2016-09-09 DIAGNOSIS — I83012 Varicose veins of right lower extremity with ulcer of calf: Secondary | ICD-10-CM | POA: Diagnosis not present

## 2016-09-09 DIAGNOSIS — L97309 Non-pressure chronic ulcer of unspecified ankle with unspecified severity: Secondary | ICD-10-CM

## 2016-09-09 DIAGNOSIS — M79661 Pain in right lower leg: Secondary | ICD-10-CM | POA: Diagnosis not present

## 2016-09-09 DIAGNOSIS — L97329 Non-pressure chronic ulcer of left ankle with unspecified severity: Secondary | ICD-10-CM

## 2016-09-09 DIAGNOSIS — I83023 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: Secondary | ICD-10-CM | POA: Diagnosis not present

## 2016-09-09 DIAGNOSIS — S81802S Unspecified open wound, left lower leg, sequela: Secondary | ICD-10-CM

## 2016-09-09 DIAGNOSIS — I83018 Varicose veins of right lower extremity with ulcer other part of lower leg: Secondary | ICD-10-CM | POA: Diagnosis not present

## 2016-09-09 DIAGNOSIS — S81801S Unspecified open wound, right lower leg, sequela: Secondary | ICD-10-CM

## 2016-09-09 DIAGNOSIS — L97819 Non-pressure chronic ulcer of other part of right lower leg with unspecified severity: Secondary | ICD-10-CM

## 2016-09-09 NOTE — Therapy (Signed)
[place], [place], [address] Phone: [phone]   Fax:  [phone]  Wound Care Therapy  Patient Details  Name: Matthew Wise MRN: [id number] Date of Birth: 02-07-1961 No Data Recorded  Encounter Date: 09/09/2016      PT End of Session - 09/09/16 1336    Visit Number 33   Number of Visits 34   Date for PT Re-Evaluation 09/18/16   Authorization Type medicare   Authorization Time Period 9/21- 10/21   Authorization - Visit Number 44   Authorization - Number of Visits 34   PT Start Time 1120   PT Stop Time 1200   PT Time Calculation (min) 40 min   Activity Tolerance Patient tolerated treatment well   Behavior During Therapy [facility] for tasks assessed/performed      Past Medical History:  Diagnosis Date  . Chronic anticoagulation 11/14/2014  . 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 DVT (deep venous thrombosis) ([place]) 06/15/2011  . DVT of leg (deep venous thrombosis) (HCC)    rt. leg  . H/O PE (pulmonary embolism) 06/15/2011  . 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 COLONOSCOPY WITH PROPOFOL N/A 10/29/2015   Procedure: COLONOSCOPY WITH PROPOFOL;  Surgeon: Danie Binder, MD;  [facility];  Service: Endoscopy;  Laterality: N/A;  930  . None to date  10/07/15    There were no vitals filed for this visit.                  Wound Therapy - 09/09/16 1334    Subjective Pt comes today with short stretch, no dressing on wound.    Patient and Family Stated Goals wounds to heal    Date of Onset 03/14/16  a couple weeks ago (04/01/2016)   Prior Treatments self care   Pain Assessment No/denies pain   Wound Properties Date First Assessed: 04/01/15 Time First Assessed: 1100 Wound Type: Other (Comment) Location: Leg Location Orientation:  Right;Posterior Wound Description (Comments): Rt posterior mid calf region Present on Admission: Yes   Dressing Type --  pt comes in with compression sock and bandaid on her leg    Dressing Changed Changed   Dressing Status Old drainage   Dressing Change Frequency PRN   Site / Wound Assessment Dusky  very dry   % Wound base Red or Granulating 90%   % Wound base Other (Comment) 10%  dusky   Margins Epibole (rolled edges)   Drainage Amount Scant   Drainage Description Serosanguineous   Treatment Cleansed;Debridement (Selective)   Selective Debridement - Location Rt posterior wound and epiboled edges    Selective Debridement - Tools Used Forceps;Scalpel   Selective Debridement - Tissue Removed biofilm, slough and epibled edges   Wound Therapy - Clinical Statement Continued improvement with overall reduction in size of wound.  Perimeter of wound continues with dry skin and some small eruptions around the area, but overall improved.  Cleansed and dressed and used short stretch for compression to help reduce fluid to heal wound.    Wound Therapy - Functional Problem List non healing wounds    Factors Delaying/Impairing Wound Healing Altered sensation;Diabetes Mellitus;Infection - systemic/local;Multiple medical problems;Vascular compromise   Hydrotherapy Plan Other (comment);Dressing change   Wound Therapy - Frequency --  1x/week   Wound Therapy -  Current Recommendations PT   Wound Plan debridement to wound Rt LE as needed followed by Compression bandaging.   Dressing  xeroform, gauze,profore                   PT Short Term Goals - 07/13/16 1615      PT SHORT TERM GOAL #1   Title Patient to present with 0% slough on all wounds and 100% red granulation tissue to demonstrate overall improvement of condition    Time 4   Period Weeks   Status On-going     PT SHORT TERM GOAL #2   Title Patient to demonstarte improved moisture in tissues surrounding wounds in order to facilitate  good healing process    Time 4   Period Weeks   Status Partially Met     PT SHORT TERM GOAL #3   Title All wound dimensions to have decreased by at least 40% in order to demonstarte improved condition    Time 4   Period Weeks   Status Partially Met           PT Long Term Goals - 07/13/16 1615      PT LONG TERM GOAL #1   Title Patient to demonstrate full wound healing in all wounds   Time 8   Period Weeks   Status Not Met     PT LONG TERM GOAL #2   Title Patient to be wearing compression at all times to assist in preventing recurrence of wounds in face of lymphedema co-morbidity    Time 8   Period Weeks   Status Not Met             Patient will benefit from skilled therapeutic intervention in order to improve the following deficits and impairments:     Visit Diagnosis: Unspecified open wound, right lower leg, sequela  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 Cellulitis of right lower extremity   . Cellulitis 02/03/2015  . Blepharitis 02/03/2015  . Chronic anticoagulation 11/14/2014  . 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 Leg wound, left 08/19/2011  . DVT (deep venous thrombosis) ([place]) 06/15/2011  . H/O PE (pulmonary embolism) 06/15/2011  . 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 [place], [place], [address] Phone: [phone]   Fax:  [phone]  Name: Matthew Wise MRN: [id number] Date of Birth: 1961/11/04

## 2016-09-16 ENCOUNTER — Ambulatory Visit (HOSPITAL_COMMUNITY): Payer: Medicare Other | Admitting: Physical Therapy

## 2016-09-17 ENCOUNTER — Ambulatory Visit (HOSPITAL_COMMUNITY): Payer: Medicare Other

## 2016-09-17 DIAGNOSIS — I83012 Varicose veins of right lower extremity with ulcer of calf: Secondary | ICD-10-CM | POA: Diagnosis not present

## 2016-09-17 DIAGNOSIS — I83018 Varicose veins of right lower extremity with ulcer other part of lower leg: Secondary | ICD-10-CM | POA: Diagnosis not present

## 2016-09-17 DIAGNOSIS — S81801S Unspecified open wound, right lower leg, sequela: Secondary | ICD-10-CM

## 2016-09-17 DIAGNOSIS — I83023 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: Secondary | ICD-10-CM | POA: Diagnosis not present

## 2016-09-17 DIAGNOSIS — S81802S Unspecified open wound, left lower leg, sequela: Secondary | ICD-10-CM | POA: Diagnosis not present

## 2016-09-17 DIAGNOSIS — L97219 Non-pressure chronic ulcer of right calf with unspecified severity: Secondary | ICD-10-CM

## 2016-09-17 DIAGNOSIS — M79661 Pain in right lower leg: Secondary | ICD-10-CM | POA: Diagnosis not present

## 2016-09-17 NOTE — Therapy (Signed)
[place], [place], [address] Phone: [phone]   Fax:  [phone]  Wound Care Therapy  Patient Details  Name: Matthew Wise MRN: [id number] Date of Birth: 05-17-1961 No Data Recorded  Encounter Date: 09/17/2016      PT End of Session - 09/17/16 1451    Visit Number 34   Number of Visits 38   Date for PT Re-Evaluation 10/15/16   Authorization Type medicare   Authorization Time Period 9/21- 10/21   Authorization - Visit Number 98   Authorization - Number of Visits 34   PT Start Time 7096   PT Stop Time 1432   PT Time Calculation (min) 38 min   Activity Tolerance Patient tolerated treatment well   Behavior During Therapy [facility] for tasks assessed/performed      Past Medical History:  Diagnosis Date  . Chronic anticoagulation 11/14/2014  . 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 DVT (deep venous thrombosis) ([place]) 06/15/2011  . DVT of leg (deep venous thrombosis) (HCC)    rt. leg  . H/O PE (pulmonary embolism) 06/15/2011  . 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 COLONOSCOPY WITH PROPOFOL N/A 10/29/2015   Procedure: COLONOSCOPY WITH PROPOFOL;  Surgeon: Danie Binder, MD;  [facility];  Service: Endoscopy;  Laterality: N/A;  930  . None to date  10/07/15    There were no vitals filed for this visit.       Subjective Assessment - 09/17/16 1438    Subjective Pt entered dept with garments on, reports he took shower and removed dressing prior session.  Pt did c/o stye in Lt eye.   Currently in Pain? No/denies            Wound Therapy - 09/17/16 1440    Subjective Pt entered dept with garments on, reports he took shower and removed dressing prior session.  Pt did c/o stye in Lt eye.   Patient and Family Stated Goals wounds to heal     Date of Onset 03/14/16   Prior Treatments self care   Pain Assessment No/denies pain   Wound Properties Date First Assessed: 04/01/15 Time First Assessed: 1100 Wound Type: Other (Comment) Location: Leg Location Orientation: Right;Posterior Wound Description (Comments): Rt posterior mid calf region Present on Admission: Yes   Dressing Type Compression wrap  xeroform on lateral, medihoney medial, vaseline and profore   Dressing Changed Changed   Dressing Status Old drainage   Dressing Change Frequency PRN   Site / Wound Assessment Dusky   % Wound base Red or Granulating 90%  Medial 90%, lateral 20%   % Wound base Yellow 30%  lateral aspect   % Wound base Other (Comment) 10%  Medial 10%; lateral 50%.   Wound Length (cm) --  Medial L .4,  Lateral L .8, (was 1.2)   Wound Width (cm) --  Medial W 1.0, ; Lateral W 1.0 (was 2.5cm)   Wound Depth (cm) --  Medial D .1; Lateral D .3 (was .3cm)   Margins Epibole (rolled edges)   Drainage Amount Scant   Drainage Description Serosanguineous   Treatment Cleansed;Debridement (Selective)   Selective Debridement - Location Rt posterior wound and epiboled edges    Selective Debridement - Tools Used Forceps;Scalpel   Selective Debridement - Tissue Removed biofilm,  slough and epibled edges   Wound Therapy - Clinical Statement Measurements taken for reassessment with findings of overall reduction in size of wound except for the depth on lateral wound aspect which continues to be .3cm in depth.  The perimeter of wound continued to be very dry and wounds are dusky and some small eruptions around the area.  Continued with xeroform on medial aspect and applied medihoney to lateral aspect with profore compression to address edema.  No reports of pain through session.  .   Wound Therapy - Functional Problem List non healing wounds    Factors Delaying/Impairing Wound Healing Altered sensation;Diabetes Mellitus;Infection - systemic/local;Multiple medical  problems;Vascular compromise   Hydrotherapy Plan Other (comment);Dressing change   Wound Therapy - Frequency Other (comment)  1x/week   Wound Therapy - Current Recommendations PT   Wound Plan Recommend continuing 1x/week for 4 more weeks to address wounds (per PT)   Dressing  xeroform on medial wound, medihoney on lateral, gauze,profore                   PT Short Term Goals - 07/13/16 1615      PT SHORT TERM GOAL #1   Title Patient to present with 0% slough on all wounds and 100% red granulation tissue to demonstrate overall improvement of condition    Time 4   Period Weeks   Status On-going     PT SHORT TERM GOAL #2   Title Patient to demonstarte improved moisture in tissues surrounding wounds in order to facilitate good healing process    Time 4   Period Weeks   Status Partially Met     PT SHORT TERM GOAL #3   Title All wound dimensions to have decreased by at least 40% in order to demonstarte improved condition    Time 4   Period Weeks   Status Partially Met           PT Long Term Goals - 07/13/16 1615      PT LONG TERM GOAL #1   Title Patient to demonstrate full wound healing in all wounds   Time 8   Period Weeks   Status Not Met     PT LONG TERM GOAL #2   Title Patient to be wearing compression at all times to assist in preventing recurrence of wounds in face of lymphedema co-morbidity    Time 8   Period Weeks   Status Not Met             Patient will benefit from skilled therapeutic intervention in order to improve the following deficits and impairments:     Visit Diagnosis: Unspecified open wound, right lower leg, sequela - Plan: PT plan of care cert/re-cert  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 Cellulitis of right lower extremity    . Cellulitis 02/03/2015  . Blepharitis 02/03/2015  . Chronic anticoagulation 11/14/2014  . 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 Leg wound, left 08/19/2011  . DVT (deep venous thrombosis) ([place]) 06/15/2011  . H/O PE (pulmonary embolism) 06/15/2011  . 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   Matthew Wise, LPTA; CBIS [phone]      G-Codes - Sep 30, 2016 1630  Functional Assessment Tool Used granulation and size of wound    Functional Limitation Mobility: Walking and moving around   Mobility: Walking and Moving Around Current Status [phone]) At least 20 percent but less than 40 percent impaired, limited or restricted   Mobility: Walking and Moving Around Goal Status ([id number]) At least 1 percent but less than 20 percent impaired, limited or restricted      Matthew Wise PT, DPT [place], [place], [address] Phone: [phone]   Fax:  [phone]  Name: Matthew Wise MRN: [id number] Date of Birth: 10-29-1961

## 2016-09-23 ENCOUNTER — Ambulatory Visit (HOSPITAL_COMMUNITY): Payer: Medicare Other | Admitting: Physical Therapy

## 2016-09-23 DIAGNOSIS — I83023 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: Secondary | ICD-10-CM | POA: Diagnosis not present

## 2016-09-23 DIAGNOSIS — S81802S Unspecified open wound, left lower leg, sequela: Secondary | ICD-10-CM

## 2016-09-23 DIAGNOSIS — L97219 Non-pressure chronic ulcer of right calf with unspecified severity: Secondary | ICD-10-CM

## 2016-09-23 DIAGNOSIS — L97309 Non-pressure chronic ulcer of unspecified ankle with unspecified severity: Secondary | ICD-10-CM

## 2016-09-23 DIAGNOSIS — I83012 Varicose veins of right lower extremity with ulcer of calf: Secondary | ICD-10-CM

## 2016-09-23 DIAGNOSIS — M79661 Pain in right lower leg: Secondary | ICD-10-CM | POA: Diagnosis not present

## 2016-09-23 DIAGNOSIS — I83018 Varicose veins of right lower extremity with ulcer other part of lower leg: Secondary | ICD-10-CM | POA: Diagnosis not present

## 2016-09-23 DIAGNOSIS — S81801S Unspecified open wound, right lower leg, sequela: Secondary | ICD-10-CM | POA: Diagnosis not present

## 2016-09-23 DIAGNOSIS — L97329 Non-pressure chronic ulcer of left ankle with unspecified severity: Secondary | ICD-10-CM

## 2016-09-23 NOTE — Therapy (Signed)
[place], [place], [address] Phone: [phone]   Fax:  [phone]  Wound Care Therapy  Patient Details  Name: Matthew Wise MRN: [id number] Date of Birth: [birth year] No Data Recorded  Encounter Date: 09/23/2016      PT End of Session - 09/23/16 1435    Visit Number 35   Number of Visits 38   Date for PT Re-Evaluation 10/15/16   Authorization Type medicare   Authorization Time Period 9/21- 10/21   Authorization - Visit Number 68   Authorization - Number of Visits 34   PT Start Time 1400  pt was late   PT Stop Time 1430   PT Time Calculation (min) 30 min   Activity Tolerance Patient tolerated treatment well   Behavior During Therapy [facility] for tasks assessed/performed      Past Medical History:  Diagnosis Date  . Chronic anticoagulation 11/14/2014  . 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 DVT (deep venous thrombosis) ([place]) 06/15/2011  . DVT of leg (deep venous thrombosis) (HCC)    rt. leg  . H/O PE (pulmonary embolism) 06/15/2011  . 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 COLONOSCOPY WITH PROPOFOL N/A 10/29/2015   Procedure: COLONOSCOPY WITH PROPOFOL;  Surgeon: Danie Binder, MD;  [facility];  Service: Endoscopy;  Laterality: N/A;  930  . None to date  10/07/15    There were no vitals filed for this visit.                  Wound Therapy - 09/23/16 1431    Subjective Pt with compression stocking on    Patient and Family Stated Goals wounds to heal    Date of Onset 03/14/16   Prior Treatments self care   Pain Assessment No/denies pain   Wound Properties Date First Assessed: 04/01/15 Time First Assessed: 1100 Wound Type: Other (Comment) Location: Leg Location Orientation: Right;Posterior Wound Description (Comments): Rt  posterior mid calf region Present on Admission: Yes   Dressing Type Compression wrap  xeroform on lateral, medihoney medial, vaseline and profore   Dressing Changed Changed   Dressing Status Old drainage   Dressing Change Frequency PRN   Site / Wound Assessment Dusky   % Wound base Red or Granulating 90%  Medial 90%, lateral 20%   % Wound base Yellow 10%  lateral aspect   % Wound base Other (Comment) --  Medial 10%; lateral 50%.   Margins Epibole (rolled edges)   Drainage Amount Scant   Drainage Description Serosanguineous   Treatment Cleansed;Debridement (Selective)   Selective Debridement - Location Rt posterior wound and epiboled edges    Selective Debridement - Tools Used Forceps;Scalpel   Selective Debridement - Tissue Removed biofilm, slough and epibled edges   Wound Therapy - Clinical Statement All wounds healed this session except for most medial one.  wound bed covered in slough but easily debrided off.  Continued with xerforom and used profore system this session.  Cotton padding used around ankle to improve shape for even compression.   Wound Therapy - Functional Problem List non healing wounds    Factors Delaying/Impairing Wound Healing Altered sensation;Diabetes Mellitus;Infection - systemic/local;Multiple medical problems;Vascular compromise   Hydrotherapy Plan Other (comment);Dressing change   Wound Therapy - Frequency Other (comment)  1x/week  Wound Therapy - Current Recommendations PT   Wound Plan Continue woundcare and appropriate dressing change using compression.    Dressing  xeroform on medial wound, profore                   PT Short Term Goals - 07/13/16 1615      PT SHORT TERM GOAL #1   Title Patient to present with 0% slough on all wounds and 100% red granulation tissue to demonstrate overall improvement of condition    Time 4   Period Weeks   Status On-going     PT SHORT TERM GOAL #2   Title Patient to demonstarte improved moisture in  tissues surrounding wounds in order to facilitate good healing process    Time 4   Period Weeks   Status Partially Met     PT SHORT TERM GOAL #3   Title All wound dimensions to have decreased by at least 40% in order to demonstarte improved condition    Time 4   Period Weeks   Status Partially Met           PT Long Term Goals - 07/13/16 1615      PT LONG TERM GOAL #1   Title Patient to demonstrate full wound healing in all wounds   Time 8   Period Weeks   Status Not Met     PT LONG TERM GOAL #2   Title Patient to be wearing compression at all times to assist in preventing recurrence of wounds in face of lymphedema co-morbidity    Time 8   Period Weeks   Status Not Met             Patient will benefit from skilled therapeutic intervention in order to improve the following deficits and impairments:     Visit Diagnosis: Unspecified open wound, right lower leg, sequela  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 Cellulitis of right lower extremity   . Cellulitis 02/03/2015  . Blepharitis 02/03/2015  . Chronic anticoagulation 11/14/2014  . 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 Leg wound, left 08/19/2011  . DVT (deep venous thrombosis) ([place]) 06/15/2011  . H/O PE (pulmonary embolism) 06/15/2011  . 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 [place], [place], [address] Phone: [phone]   Fax:  [phone]  Name: Matthew Wise MRN: [id number] Date of Birth: 1961-08-25

## 2016-09-30 ENCOUNTER — Ambulatory Visit (HOSPITAL_COMMUNITY): Payer: Medicare Other | Attending: Internal Medicine | Admitting: Physical Therapy

## 2016-09-30 DIAGNOSIS — X58XXXS Exposure to other specified factors, sequela: Secondary | ICD-10-CM | POA: Diagnosis not present

## 2016-09-30 DIAGNOSIS — M79661 Pain in right lower leg: Secondary | ICD-10-CM | POA: Diagnosis not present

## 2016-09-30 DIAGNOSIS — S81801S Unspecified open wound, right lower leg, sequela: Secondary | ICD-10-CM

## 2016-09-30 DIAGNOSIS — I83012 Varicose veins of right lower extremity with ulcer of calf: Secondary | ICD-10-CM | POA: Insufficient documentation

## 2016-09-30 DIAGNOSIS — L97219 Non-pressure chronic ulcer of right calf with unspecified severity: Secondary | ICD-10-CM

## 2016-09-30 NOTE — Therapy (Addendum)
[place], [place], [address] Phone: [phone]   Fax:  [phone]  Wound Care Therapy  Patient Details  Name: Matthew Wise MRN: [id number] Date of Birth: 1961/03/29 No Data Recorded  Encounter Date: 09/30/2016      PT End of Session - 09/30/16 1425    Visit Number 36   Number of Visits 38   Date for PT Re-Evaluation 10/15/16   Authorization Type medicare   Authorization Time Period 9/21- 10/21   Authorization - Visit Number 68   Authorization - Number of Visits 38   PT Start Time 1350   PT Stop Time 1420   PT Time Calculation (min) 30 min   Activity Tolerance Patient tolerated treatment well   Behavior During Therapy [facility] for tasks assessed/performed      Past Medical History:  Diagnosis Date  . Chronic anticoagulation 11/14/2014  . 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 DVT (deep venous thrombosis) ([place]) 06/15/2011  . DVT of leg (deep venous thrombosis) ([facility])    rt. leg  . H/O PE (pulmonary embolism) 06/15/2011  . 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 COLONOSCOPY WITH PROPOFOL N/A 10/29/2015   Procedure: COLONOSCOPY WITH PROPOFOL;  Surgeon: Danie Binder, MD;  [facility];  Service: Endoscopy;  Laterality: N/A;  930  . None to date  10/07/15    There were no vitals filed for this visit.                  Wound Therapy - 09/30/16 1419    Subjective Pt states that he is trying to use his compression pump at least once a day.     Patient and Family Stated Goals wounds to heal    Date of Onset 03/14/16   Prior Treatments self care   Pain Assessment No/denies pain   Wound Properties Date First Assessed: 04/01/15 Time First Assessed: 1100 Wound Type: Other (Comment) Location: Leg Location Orientation: Right;Posterior  Wound Description (Comments): Rt posterior mid calf region Present on Admission: Yes   Dressing Type --  Pt comes to department with compression garment on.   Dressing Changed Changed   Dressing Status Old drainage   Dressing Change Frequency PRN   Site / Wound Assessment Dusky   % Wound base Red or Granulating 95%  Medial 90%, lateral 20%   % Wound base Yellow 5%  lateral aspect   % Wound base Other (Comment) --  Medial 10%; lateral 50%.   Wound Length (cm) 1 cm   Wound Width (cm) 0.5 cm  was 1.0   Wound Depth (cm) 0.3 cm  was .5   Margins Epibole (rolled edges)   Drainage Amount Scant   Drainage Description Serosanguineous   Treatment Cleansed;Debridement (Selective)   Selective Debridement - Location Rt posterior wound and epiboled edges    Selective Debridement - Tools Used Forceps;Scalpel   Selective Debridement - Tissue Removed biofilm, slough and epibled edges   Wound Therapy - Clinical Statement Pt wound continues to slowly fill in but remains present.  Due to pt history we will continue to see him one more time but will anticipate discharge next visti with self care if wound is still present.  Pt is using compression pump at home and wearing new compression garment.  Wound Therapy - Functional Problem List non healing wounds    Factors Delaying/Impairing Wound Healing Altered sensation;Diabetes Mellitus;Infection - systemic/local;Multiple medical problems;Vascular compromise   Hydrotherapy Plan Other (comment);Dressing change   Wound Therapy - Frequency Other (comment)  1x/week   Wound Therapy - Current Recommendations PT   Wound Plan Continue woundcare and appropriate dressing change using compression.    Dressing  xeroform followed by multilayer bandaging using profore compression system.  Extra cotton used to ensure a cone shape to LE prior to wrapping .                    PT Short Term Goals - 07/13/16 1615      PT SHORT TERM GOAL #1   Title Patient  to present with 0% slough on all wounds and 100% red granulation tissue to demonstrate overall improvement of condition    Time 4   Period Weeks   Status On-going     PT SHORT TERM GOAL #2   Title Patient to demonstarte improved moisture in tissues surrounding wounds in order to facilitate good healing process    Time 4   Period Weeks   Status Partially Met     PT SHORT TERM GOAL #3   Title All wound dimensions to have decreased by at least 40% in order to demonstarte improved condition    Time 4   Period Weeks   Status Partially Met           PT Long Term Goals - 07/13/16 1615      PT LONG TERM GOAL #1   Title Patient to demonstrate full wound healing in all wounds   Time 8   Period Weeks   Status Not Met     PT LONG TERM GOAL #2   Title Patient to be wearing compression at all times to assist in preventing recurrence of wounds in face of lymphedema co-morbidity    Time 8   Period Weeks   Status Not Met               Plan - 09/30/16 1426    Clinical Impression Statement see above.    Rehab Potential Good   PT Frequency 2x / week   PT Duration 4 weeks   PT Treatment/Interventions Manual lymph drainage;Manual techniques;Compression bandaging;Other (comment)  debridement   PT Next Visit Plan Anticipate discharge.       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 Cellulitis of right lower extremity   . Cellulitis 02/03/2015  . Blepharitis 02/03/2015  . Chronic anticoagulation 11/14/2014  . 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 Leg wound, left  08/19/2011  . DVT (deep venous thrombosis) ([place]) 06/15/2011  . H/O PE (pulmonary embolism) 06/15/2011  . 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

## 2016-10-07 ENCOUNTER — Ambulatory Visit (HOSPITAL_COMMUNITY): Payer: Medicare Other

## 2016-10-07 DIAGNOSIS — L97219 Non-pressure chronic ulcer of right calf with unspecified severity: Secondary | ICD-10-CM

## 2016-10-07 DIAGNOSIS — M79661 Pain in right lower leg: Secondary | ICD-10-CM

## 2016-10-07 DIAGNOSIS — I83012 Varicose veins of right lower extremity with ulcer of calf: Secondary | ICD-10-CM | POA: Diagnosis not present

## 2016-10-07 DIAGNOSIS — S81801S Unspecified open wound, right lower leg, sequela: Secondary | ICD-10-CM | POA: Diagnosis not present

## 2016-10-07 NOTE — Therapy (Signed)
[place], [place], [address] Phone: [phone]   Fax:  [phone]  Wound Care Therapy  Patient Details  Name: Matthew Wise MRN: [id number] Date of Birth: 1961/02/01 No Data Recorded  Encounter Date: 10/07/2016      PT End of Session - 10/07/16 1425    Visit Number 37   Number of Visits 38   Date for PT Re-Evaluation 10/15/16   Authorization Type medicare   Authorization - Visit Number 74   Authorization - Number of Visits 38   PT Start Time 1350   PT Stop Time 1412   PT Time Calculation (min) 22 min   Activity Tolerance Patient tolerated treatment well   Behavior During Therapy [facility] for tasks assessed/performed      Past Medical History:  Diagnosis Date  . Chronic anticoagulation 11/14/2014  . 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 DVT (deep venous thrombosis) ([place]) 06/15/2011  . DVT of leg (deep venous thrombosis) ([facility])    rt. leg  . H/O PE (pulmonary embolism) 06/15/2011  . 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 COLONOSCOPY WITH PROPOFOL N/A 10/29/2015   Procedure: COLONOSCOPY WITH PROPOFOL;  Surgeon: Danie Binder, MD;  [facility];  Service: Endoscopy;  Laterality: N/A;  930  . None to date  10/07/15    There were no vitals filed for this visit.       Subjective Assessment - 10/07/16 1418    Subjective Pt entered dept with garments on, reports no pain and very minimal drainage onto dressings   Currently in Pain? No/denies                   Wound Therapy - 10/07/16 1419    Subjective Pt entered dept with garments on, reports no pain and very minimal drainage onto dressings   Patient and Family Stated Goals wounds to heal    Date of Onset 03/14/16   Prior Treatments self care   Pain Assessment No/denies pain    Evaluation and Treatment Procedures Explained to Patient/Family Yes   Evaluation and Treatment Procedures agreed to   Wound Properties Date First Assessed: 04/01/15 Time First Assessed: 1100 Wound Type: Other (Comment) Location: Leg Location Orientation: Right;Posterior Wound Description (Comments): Rt posterior mid calf region Present on Admission: Yes   Dressing Type Impregnated gauze (bismuth)  Xeroform, 2x2, medipore tape, compression garment   Dressing Changed Changed   Dressing Status Old drainage   Dressing Change Frequency PRN   Site / Wound Assessment Dusky   % Wound base Red or Granulating 95%   % Wound base Yellow/Fibrinous Exudate 5%   Wound Length (cm) 0.9 cm  was 1.2   Wound Width (cm) 0.4 cm  was 2.5   Wound Depth (cm) 0.2 cm  was .3   Margins Epibole (rolled edges)   Drainage Amount Scant   Drainage Description Serosanguineous   Treatment Cleansed;Debridement (Selective)   Selective Debridement - Location Rt posterior wound and epiboled edges    Selective Debridement - Tools Used Forceps;Scalpel   Selective Debridement - Tissue Removed biofilm, slough and epibled edges   Wound Therapy - Clinical Statement Wound continues to fill slowly but remain present.  Minimal debridement necessary for removal of biofilm and dryskin with minimal scant drainage.  Pt educated  on self care technqiue as skilled intervention is no further needed.  Pt able to verbalize appropriate techqniue with no questions remaining.    Wound Therapy - Functional Problem List non healing wounds    Factors Delaying/Impairing Wound Healing Altered sensation;Diabetes Mellitus;Infection - systemic/local;Multiple medical problems;Vascular compromise   Hydrotherapy Plan Other (comment);Dressing change   Wound Therapy - Frequency --  1x/week   Wound Therapy - Current Recommendations PT   Wound Plan Discharge to self care as skilled intervention no longer needed   Dressing  xeroform with 2x2 medipore tape;  compression garments                   PT Short Term Goals - 07/13/16 1615      PT SHORT TERM GOAL #1   Title Patient to present with 0% slough on all wounds and 100% red granulation tissue to demonstrate overall improvement of condition    Time 4   Period Weeks   Status On-going     PT SHORT TERM GOAL #2   Title Patient to demonstarte improved moisture in tissues surrounding wounds in order to facilitate good healing process    Time 4   Period Weeks   Status Partially Met     PT SHORT TERM GOAL #3   Title All wound dimensions to have decreased by at least 40% in order to demonstarte improved condition    Time 4   Period Weeks   Status Partially Met           PT Long Term Goals - 07/13/16 1615      PT LONG TERM GOAL #1   Title Patient to demonstrate full wound healing in all wounds   Time 8   Period Weeks   Status Not Met     PT LONG TERM GOAL #2   Title Patient to be wearing compression at all times to assist in preventing recurrence of wounds in face of lymphedema co-morbidity    Time 8   Period Weeks   Status Not Met             Patient will benefit from skilled therapeutic intervention in order to improve the following deficits and impairments:     Visit Diagnosis: Unspecified open wound, right lower leg, sequela  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 Cellulitis of right lower extremity   . Cellulitis 02/03/2015  . Blepharitis 02/03/2015  .  Chronic anticoagulation 11/14/2014  . 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 Leg wound, left 08/19/2011  . DVT (deep venous thrombosis) ([place]) 06/15/2011  . H/O PE (pulmonary embolism) 06/15/2011  . 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  Patient goals were partially met. Patient is being discharged due to meeting the stated rehab goals.  ?????        [name], [place] CLT [phone]

## 2016-11-21 ENCOUNTER — Other Ambulatory Visit (HOSPITAL_COMMUNITY): Payer: Self-pay | Admitting: Hematology & Oncology

## 2016-11-21 DIAGNOSIS — D649 Anemia, unspecified: Secondary | ICD-10-CM

## 2016-11-21 DIAGNOSIS — I82409 Acute embolism and thrombosis of unspecified deep veins of unspecified lower extremity: Secondary | ICD-10-CM

## 2017-01-02 ENCOUNTER — Other Ambulatory Visit (HOSPITAL_COMMUNITY): Payer: Self-pay | Admitting: Hematology & Oncology

## 2017-01-02 DIAGNOSIS — D649 Anemia, unspecified: Secondary | ICD-10-CM

## 2017-01-02 DIAGNOSIS — I82409 Acute embolism and thrombosis of unspecified deep veins of unspecified lower extremity: Secondary | ICD-10-CM

## 2017-01-27 ENCOUNTER — Ambulatory Visit (HOSPITAL_COMMUNITY): Payer: Medicare Other | Admitting: Oncology

## 2017-01-27 ENCOUNTER — Other Ambulatory Visit (HOSPITAL_COMMUNITY): Payer: Medicare Other

## 2017-02-15 ENCOUNTER — Other Ambulatory Visit (HOSPITAL_COMMUNITY): Payer: Medicare Other

## 2017-02-15 ENCOUNTER — Ambulatory Visit (HOSPITAL_COMMUNITY): Payer: Medicare Other | Admitting: Adult Health

## 2017-06-03 ENCOUNTER — Encounter (HOSPITAL_COMMUNITY): Payer: Medicare Other

## 2017-06-03 ENCOUNTER — Encounter (HOSPITAL_COMMUNITY): Payer: Self-pay | Admitting: Adult Health

## 2017-06-03 ENCOUNTER — Encounter (HOSPITAL_COMMUNITY): Payer: Medicare Other | Attending: Adult Health | Admitting: Adult Health

## 2017-06-03 VITALS — BP 129/76 | HR 73 | Resp 16 | Ht 72.0 in | Wt 275.0 lb

## 2017-06-03 DIAGNOSIS — Z720 Tobacco use: Secondary | ICD-10-CM

## 2017-06-03 DIAGNOSIS — I82401 Acute embolism and thrombosis of unspecified deep veins of right lower extremity: Secondary | ICD-10-CM | POA: Diagnosis not present

## 2017-06-03 DIAGNOSIS — Z7901 Long term (current) use of anticoagulants: Secondary | ICD-10-CM

## 2017-06-03 DIAGNOSIS — R42 Dizziness and giddiness: Secondary | ICD-10-CM | POA: Diagnosis not present

## 2017-06-03 DIAGNOSIS — R609 Edema, unspecified: Secondary | ICD-10-CM

## 2017-06-03 DIAGNOSIS — M79605 Pain in left leg: Secondary | ICD-10-CM

## 2017-06-03 DIAGNOSIS — M79604 Pain in right leg: Secondary | ICD-10-CM

## 2017-06-03 DIAGNOSIS — I82403 Acute embolism and thrombosis of unspecified deep veins of lower extremity, bilateral: Secondary | ICD-10-CM

## 2017-06-03 DIAGNOSIS — R52 Pain, unspecified: Secondary | ICD-10-CM | POA: Diagnosis not present

## 2017-06-03 LAB — COMPREHENSIVE METABOLIC PANEL
ALBUMIN: 4 g/dL (ref 3.5–5.0)
ALK PHOS: 84 U/L (ref 38–126)
ALT: 24 U/L (ref 17–63)
AST: 25 U/L (ref 15–41)
Anion gap: 8 (ref 5–15)
BUN: 14 mg/dL (ref 6–20)
CALCIUM: 9 mg/dL (ref 8.9–10.3)
CO2: 28 mmol/L (ref 22–32)
Chloride: 101 mmol/L (ref 101–111)
Creatinine, Ser: 1.66 mg/dL — ABNORMAL HIGH (ref 0.61–1.24)
GFR calc Af Amer: 52 mL/min — ABNORMAL LOW (ref >60)
GFR calc non Af Amer: 45 mL/min — ABNORMAL LOW (ref >60)
GLUCOSE: 75 mg/dL (ref 65–99)
Potassium: 3.5 mmol/L (ref 3.5–5.1)
SODIUM: 137 mmol/L (ref 135–145)
Total Bilirubin: 1 mg/dL (ref 0.3–1.2)
Total Protein: 7.5 g/dL (ref 6.5–8.1)

## 2017-06-03 LAB — CBC WITH DIFFERENTIAL/PLATELET
BASOS ABS: 0 10*3/uL (ref 0.0–0.1)
BASOS PCT: 1 %
EOS ABS: 0.1 10*3/uL (ref 0.0–0.7)
Eosinophils Relative: 2 %
HEMATOCRIT: 45.6 % (ref 39.0–52.0)
HEMOGLOBIN: 15.9 g/dL (ref 13.0–17.0)
Lymphocytes Relative: 41 %
Lymphs Abs: 1.8 10*3/uL (ref 0.7–4.0)
MCH: 30.3 pg (ref 26.0–34.0)
MCHC: 34.9 g/dL (ref 30.0–36.0)
MCV: 87 fL (ref 78.0–100.0)
Monocytes Absolute: 0.6 10*3/uL (ref 0.1–1.0)
Monocytes Relative: 13 %
NEUTROS ABS: 1.9 10*3/uL (ref 1.7–7.7)
Neutrophils Relative %: 43 %
Platelets: 192 10*3/uL (ref 150–400)
RBC: 5.24 MIL/uL (ref 4.22–5.81)
RDW: 13.8 % (ref 11.5–15.5)
WBC: 4.4 10*3/uL (ref 4.0–10.5)

## 2017-06-03 LAB — FERRITIN: Ferritin: 140 ng/mL (ref 24–336)

## 2017-06-03 MED ORDER — OXYCODONE-ACETAMINOPHEN 5-325 MG PO TABS: 1.0000 | ORAL_TABLET | ORAL | 0 refills | Status: DC | PRN

## 2017-06-03 NOTE — Patient Instructions (Addendum)
[facility] at [facility] Discharge Instructions  RECOMMENDATIONS MADE BY THE CONSULTANT AND ANY TEST RESULTS WILL BE SENT TO YOUR REFERRING PHYSICIAN.  You were seen today by Gretchen Dawson NP. Labs today, we will call you with those results. Return in 6 months for labs and follow up.    Thank you for choosing  Can[facility] Center at [facility] to provide your oncology and hematology care.  To afford each patient quality time with our provider, please arrive at least 15 minutes before your scheduled appointment time.    If you have a lab appointment with the Cancer Center please come in thru the  Main Entrance and check in at the main information desk  You need to re-schedule your appointment should you arrive 10 or more minutes late.  We strive to give you quality time with our providers, and arriving late affects you and other patients whose appointments are after yours.  Also, if you no show three or more times for appointments you may be dismissed from the clinic at the providers discretion.     Again, thank you for choosing [facility].  Our hope is that these requests will decrease the amount of time that you wait before being seen by our physicians.       _____________________________________________________________  Should you have questions after your visit to [facility], please contact our office at [phone] between the hours of 8:30 a.m. and 4:30 p.m.  Voicemails left after 4:30 p.m. will not be returned until the following business day.  For prescription refill requests, have your pharmacy contact our office.       Resources For Cancer Patients and their Caregivers ? American Cancer Society: Can assist with transportation, wigs, general needs, runs Look Good Feel Better.        1-888-227-6333 ? Cancer Care: Provides financial assistance, online support groups, medication/co-pay assistance.   1-800-813-HOPE (4673) ? [facility] Assists [place] cancer patients and their families through emotional , educational and financial support.  336-427-4357 ? [place] DSS Where to apply for food stamps, Medicaid and utility assistance. 336-342-1394 ? [facility]: Transportation to medical appointments. 336-347-2287 ? Social Security Administration: May apply for disability if have a Stage IV cancer. 336-342-7796 1-800-772-1213 ? [place] Aging, Disability and Transit Services: Assists with nutrition, care and transit needs. 336-349-2343  Cancer Center Support Programs: @10RELATIVEDAYS@ > Cancer Support Group  2nd Tuesday of the month 1pm-2pm, Journey Room  > Creative Journey  3rd Tuesday of the month 1130am-1pm, Journey Room  > Look Good Feel Better  1st Wednesday of the month 10am-12 noon, Journey Room (Call American Cancer Society to register 1-800-395-5775)    

## 2017-06-03 NOTE — Progress Notes (Signed)
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 male returns for follow-up for bilat LE DVTs on chronic anticoagulation with Xarelto.   Overall, he tells me he has felt quite well.  Appetite is 100%; energy level 75%. Does report that for the past 2 weeks he has had intermittent episodes of dizziness, particularly when going from a seated to standing position. Admittedly, he does not drink enough fluids during the day.  Endorses compliance with his Xarelto therapy. Denies any active bleeding episodes including blood in his stool, dark/tarry stool, hematuria, dysuria, nosebleeds, or gingival bleeding. He has chronic lower extremity edema, which is largely unchanged. Denies any new shortness of breath, cough, or chest pain. Denies any unusual bruising.  He tells me that he recently completed rehabilitation and outpatient therapy for open wounds to his lower legs. These lesions have healed and he is doing much better in terms of the wounds.  He does continue to have intermittent lower extremity pain, for which he takes Naprosyn and Norco. He does not require either one of these medications on a regular basis, and continues to take them only as needed. He is requesting a refill of the Norco today.   Otherwise he is largely without complaints today.    REVIEW OF SYSTEMS:  Review of Systems  Constitutional: Negative.  Negative for chills, fatigue and fever.  HENT:  Negative.  Negative for nosebleeds.   Eyes: Negative.   Respiratory: Negative.  Negative for cough and shortness of  breath.   Cardiovascular: Positive for leg swelling. Negative for chest pain.  Gastrointestinal: Negative.  Negative for abdominal pain, blood in stool, constipation, diarrhea, nausea and vomiting.  Endocrine: Negative.   Genitourinary: Negative.  Negative for dysuria and hematuria.   Musculoskeletal: Positive for arthralgias and myalgias.  Neurological: Positive for dizziness.  Hematological: Negative.  Does not bruise/bleed easily.  Psychiatric/Behavioral: Negative.      PAST MEDICAL/SURGICAL HISTORY:  Past Medical History:  Diagnosis Date  . Chronic anticoagulation 11/14/2014  . 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 DVT (deep venous thrombosis) ([place]) 06/15/2011  . DVT of leg (deep venous thrombosis) (HCC)    rt. leg  . H/O PE (pulmonary embolism) 06/15/2011  . Inferior vena caval thrombosis (HCC)    Chronic   Past Surgical History:  Procedure Laterality Date  . COLONOSCOPY WITH PROPOFOL N/A 10/29/2015   Procedure: COLONOSCOPY WITH PROPOFOL;  Surgeon: Danie Binder, MD;  Location[facility];  Service: Endoscopy;  Laterality: N/A;  930  . None to date  10/07/15     SOCIAL HISTORY:  Social History   Social History  . Marital status: Divorced    Spouse name: N/A  . Number of children: N/A  . Years of education: N/A   Occupational History  . Not on file.   Social History Main Topics  . Smoking status: Current Every Day Smoker    Packs/day: 1.00    Years: 30.00    Types: Cigarettes  . Smokeless  tobacco: Never Used  . Alcohol use 3.0 oz/week    5 Cans of beer per week     Comment: 2-3 beers a day  . Drug use: No     Comment: REMOTE HISTORY, Quit in 2007  . Sexual activity: Not on file   Other Topics Concern  . Not on file   Social History Narrative  . No narrative on file    FAMILY HISTORY:  Family History  Problem Relation Age of Onset  . COPD Father   . Stroke  Brother   . Throat cancer Brother   . Colon cancer Neg Hx     CURRENT MEDICATIONS:  Outpatient Encounter Prescriptions as of 06/03/2017  Medication Sig  . naproxen (NAPROSYN) 500 MG tablet Take 1 tablet (500 mg total) by mouth 2 (two) times daily as needed for moderate pain.  Marland Kitchen oxyCODONE-acetaminophen (PERCOCET/ROXICET) 5-325 MG tablet Take 1 tablet by mouth every 4 (four) hours as needed for severe pain.  Marland Kitchen XARELTO 20 MG TABS tablet TAKE 1 TABLET BY MOUTH ONCE DAILY WITH SUPPER.  . [DISCONTINUED] oxyCODONE-acetaminophen (PERCOCET/ROXICET) 5-325 MG tablet Take 1 tablet by mouth every 4 (four) hours as needed for severe pain.   No facility-administered encounter medications on file as of 06/03/2017.     ALLERGIES:  No Known Allergies   PHYSICAL EXAM:  ECOG Performance status: 1 - Symptomatic; remains largely independent.   Vitals:   06/03/17 1428 06/03/17 1455  BP: (!) 146/108 129/76  Pulse: 73   Resp: 16    Filed Weights   06/03/17 1428  Weight: 275 lb (124.7 kg)    Physical Exam  Constitutional: He is oriented to person, place, and time and well-developed, well-nourished, and in no distress.  HENT:  Head: Normocephalic.  Mouth/Throat: Oropharynx is clear and moist. No oropharyngeal exudate.  Eyes: Conjunctivae are normal. Pupils are equal, round, and reactive to light. No scleral icterus.  Neck: Normal range of motion. Neck supple.  Cardiovascular: Normal rate, regular rhythm and normal heart sounds.   Pulmonary/Chest: Effort normal and breath sounds normal. No respiratory distress.  Abdominal: Soft. Bowel sounds are normal. There is no tenderness. There is no rebound.  Musculoskeletal: Normal range of motion. He exhibits edema (1-2+ BLE pitting edema; healed ulcerations to (R) lower leg visible. ).  Lymphadenopathy:    He has no cervical adenopathy.       Right: No supraclavicular adenopathy present.       Left: No supraclavicular adenopathy present.  Neurological: He  is alert and oriented to person, place, and time. No cranial nerve deficit. Gait normal.  Skin: Skin is warm and dry.  Psychiatric: Mood, memory, affect and judgment normal.  Nursing note and vitals reviewed.    LABORATORY DATA:  I have reviewed the labs as listed.  CBC    Component Value Date/Time   WBC 4.4 06/03/2017 1506   RBC 5.24 06/03/2017 1506   HGB 15.9 06/03/2017 1506   HCT 45.6 06/03/2017 1506   PLT 192 06/03/2017 1506   MCV 87.0 06/03/2017 1506   MCH 30.3 06/03/2017 1506   MCHC 34.9 06/03/2017 1506   RDW 13.8 06/03/2017 1506   LYMPHSABS 1.8 06/03/2017 1506   MONOABS 0.6 06/03/2017 1506   EOSABS 0.1 06/03/2017 1506   BASOSABS 0.0 06/03/2017 1506   CMP Latest Ref Rng & Units 06/03/2017 10/23/2015 05/22/2015  Glucose 65 - 99 mg/dL 75 124(H) 79  BUN 6 - 20 mg/dL 14 12 11   Creatinine 0.61 - 1.24  mg/dL 1.66(H) 1.19 1.17  Sodium 135 - 145 mmol/L 137 137 139  Potassium 3.5 - 5.1 mmol/L 3.5 4.0 3.6  Chloride 101 - 111 mmol/L 101 102 104  CO2 22 - 32 mmol/L 28 29 27   Calcium 8.9 - 10.3 mg/dL 9.0 9.4 9.0  Total Protein 6.5 - 8.1 g/dL 7.5 - 8.0  Total Bilirubin 0.3 - 1.2 mg/dL 1.0 - 1.9(H)  Alkaline Phos 38 - 126 U/L 84 - 72  AST 15 - 41 U/L 25 - 27  ALT 17 - 63 U/L 24 - 23    PENDING LABS:    DIAGNOSTIC IMAGING:    PATHOLOGY:        ASSESSMENT & PLAN:   Bilateral LE DVTs:  -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 Also noted to have at least one pulmonary embolus in the past. He was evaluated at [facility], where he was found to have sickle cell trait, which also increases the patient's risk of DVTs. As a result, it was recommended that he be on lifelong anticoagulation.  Upon work-up, he did not have cardiolipin antibodies, nor beta-2 glycoprotein antibodies.  -Currently on Xarelto and reported compliance to the medication; he continues to tolerate quite well.    -He did not have lab work before today's visit.  Will obtain some basic lab work today including CBC with diff, CMET, and ferritin. We will call him with these results.  -Return to cancer center in 6 months with labs.    Dizziness/HTN:  -Possibly secondary to hypertension and or orthostatic hypotension.  -Encouraged him to force non-caffeinated/non-alcoholic fluids as tolerated.   -Recommended he establish care with a PCP to evaluate his high blood pressure.    Health maintenance:  -Recommended he establish care with a PCP, as he admits it has been "a pretty long time" since he has seen a primary care provider.  Stressed the importance of overall health maintenance, age/gender appropriate cancer screenings, and promoting overall wellness.   -I will mail him a copy of some local PCPs and he may have to call around to see who is accepting new patients at this time.  He is agreeable to this plan and understands the importance of establishing care with a PCP.   Pain:  -Likely secondary to LE DVTs and wounds. Continue Naprosyn prn. Stressed the importance of adequate hydration and monitoring kidney function with long-term use of Naprosyn. He only takes medication PRN.  -[place] Controlled Substance Reporting System and refill is appropriate.  Paper prescription for Norco given to patient today.  Encouraged adequate bowel regimen with opiates.          Dispo:  -Labs today -Return to cancer center in 6 months for follow-up with labs.    All questions were answered to patient's stated satisfaction. Encouraged patient to call with any new concerns or questions before his next visit to the cancer center and we can certain see him sooner, if needed.    Plan of care discussed with Dr. Talbert Cage, who agrees with the above aforementioned.    A total of 20 minutes was spent in face-to-face care of this patient, with greater than 50% of that time spent in counseling and care-coordination.    Orders placed  this encounter:  Orders Placed This Encounter  Procedures  . CBC with Differential/Platelet  . Comprehensive metabolic panel  . Ferritin      Matthew Craze, NP Deneise Lever  [place] [phone]

## 2017-06-24 ENCOUNTER — Telehealth (HOSPITAL_COMMUNITY): Payer: Self-pay

## 2017-06-24 NOTE — Telephone Encounter (Signed)
Called patient back. He states he hit his leg on a screw and it will not heal. He does not have a PCP. He states "Dr. Gershon Mussel" usually sets him up where he needs to go. Explained to patient that I would send a message to the NP, Elzie Rings, since she saw him last. I told him I would call him back once I talked with Elzie Rings. He verbalized understanding.

## 2017-06-24 NOTE — Telephone Encounter (Signed)
-----   Message from Louis Meckel sent at 06/23/2017 11:17 AM EDT ----- Regarding: please call Patient called and said he needs to go to a wound center and wants to talk to you about it.  Please call him at [phone].  Thanks

## 2017-06-25 NOTE — Telephone Encounter (Signed)
Message sent to scheduling to make referrals

## 2017-06-25 NOTE — Telephone Encounter (Signed)
Can we try to get him established at [facility]?  And/or we could also make a referral to dermatology with Dr. Nevada Crane, Dr. Rozann Lesches, or their PA-Amber.    Mike Craze, NP [place] [phone]

## 2017-07-06 ENCOUNTER — Telehealth (HOSPITAL_COMMUNITY): Payer: Self-pay

## 2017-07-06 NOTE — Telephone Encounter (Signed)
Left message on patients phone when his appointment is with [facility] and to do his best to keep that appt. Also that we could not refer him to dermatologist until he paid a prior balance there. Call cancer center if any questions.

## 2017-07-06 NOTE — Telephone Encounter (Signed)
-----   Message from Yehuda Savannah sent at 06/25/2017 11:00 AM EDT ----- Regarding: appointments Appointment with Dr.Sue Meda Coffee @ [facility] Oct 16 @ 2:00 he is unable to see the dermatologist until he pays a prior balance on his account

## 2017-09-14 ENCOUNTER — Ambulatory Visit (INDEPENDENT_AMBULATORY_CARE_PROVIDER_SITE_OTHER): Payer: Medicare Other | Admitting: Family Medicine

## 2017-09-14 ENCOUNTER — Encounter: Payer: Self-pay | Admitting: Family Medicine

## 2017-09-14 VITALS — BP 112/72 | HR 68 | Temp 98.0°F | Resp 18 | Ht 72.0 in | Wt 281.0 lb

## 2017-09-14 DIAGNOSIS — Z1159 Encounter for screening for other viral diseases: Secondary | ICD-10-CM

## 2017-09-14 DIAGNOSIS — Z23 Encounter for immunization: Secondary | ICD-10-CM

## 2017-09-14 DIAGNOSIS — Z7901 Long term (current) use of anticoagulants: Secondary | ICD-10-CM

## 2017-09-14 DIAGNOSIS — D126 Benign neoplasm of colon, unspecified: Secondary | ICD-10-CM | POA: Insufficient documentation

## 2017-09-14 DIAGNOSIS — D573 Sickle-cell trait: Secondary | ICD-10-CM

## 2017-09-14 DIAGNOSIS — Z13220 Encounter for screening for lipoid disorders: Secondary | ICD-10-CM

## 2017-09-14 DIAGNOSIS — R600 Localized edema: Secondary | ICD-10-CM | POA: Diagnosis not present

## 2017-09-14 DIAGNOSIS — I82503 Chronic embolism and thrombosis of unspecified deep veins of lower extremity, bilateral: Secondary | ICD-10-CM

## 2017-09-14 MED ORDER — RIVAROXABAN 20 MG PO TABS: 20.0000 mg | ORAL_TABLET | Freq: Every day | ORAL | 3 refills | Status: DC

## 2017-09-14 NOTE — Progress Notes (Signed)
Chief Complaint  Patient presents with  . Follow-up    est   Patient has not seen a primary care for many years. He is on chronic anticoagulation for history of recurring DVTs, pulmonary emboli, complications of postphlebitic syndrome with edema and skin breakdown recurring cellulitis in both legs. He is disabled from the damage to his legs He is compliant with daily Xarelto I have discussed the multiple health risks associated with cigarette smoking including, but not limited to, cardiovascular disease, lung disease and cancer.  I have strongly recommended that smoking be stopped.  I have reviewed the various methods of quitting including cold [place], classes, nicotine replacements and prescription medications.  I have offered assistance in this difficult process.  The patient is not interested in assistance at this time. I emphasized to him that stopping smoking was the most important thing he could do to improve his health We discussed immunizations. He agrees to a flu shot. Colonoscopy in 2016. Tubular adenoma found. Due for repeat in 5 years.    Patient Active Problem List   Diagnosis Date Noted  . Pedal edema 09/14/2017  . Tubular adenoma of colon 09/14/2017  . Chronic anticoagulation 11/14/2014  . Sickle cell trait ([place]) 08/29/2012  . 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 DVT (deep venous thrombosis) ([place]) 06/15/2011  . Pulmonary embolism ([place]) 06/15/2011  . 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 oxyCODONE-acetaminophen (PERCOCET/ROXICET) 5-325 MG tablet Take 1 tablet by mouth every 4 (four) hours as needed for severe pain.  . rivaroxaban (XARELTO) 20 MG TABS tablet Take 1 tablet (20 mg total) by mouth daily with supper.   No facility-administered encounter medications on file as of 09/14/2017.     Past Medical History:  Diagnosis  Date  . Chronic anticoagulation 11/14/2014  . Clotting disorder ([place])   . 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 DVT (deep venous thrombosis) ([place]) 06/15/2011  . DVT of leg (deep venous thrombosis) ([facility])    rt. leg  . H/O PE (pulmonary embolism) 06/15/2011  . 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 COLONOSCOPY WITH PROPOFOL N/A 10/29/2015   Procedure: COLONOSCOPY WITH PROPOFOL;  Surgeon: Danie Binder, MD;  [facility];  Service: Endoscopy;  Laterality: N/A;  930  . None to date  10/07/15    Social History   Social History  . Marital status: N/A    Spouse name: N/A  . Number of children: 2  . Years of education: 12   Occupational History  . disability     DVT   Social History Main Topics  . Smoking status: Current Every Day Smoker    Packs/day: 1.00    Years: 30.00    Types: Cigarettes    Start date: 12/01/1983  . Smokeless tobacco: Never Used  . Alcohol use 3.0 oz/week    5 Cans of beer per week     Comment: 2-3 beers a day  . Drug use: No     Comment: REMOTE HISTORY, Quit in 2007  . Sexual activity: Yes   Other Topics Concern  . Not on file   Social History Narrative   Lives with son   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s  Mows lawns some    Family History  Problem Relation Age of Onset  . COPD Father   . Stroke Brother   . Pulmonary embolism Sister   . Throat cancer Brother   . Arthritis Mother   . Diabetes Mother   . Stroke Mother   . Colon cancer Neg Hx     Review of Systems  Constitutional: Negative for chills, fever and weight loss.  HENT: Negative for congestion and hearing loss.   Eyes: Negative for blurred vision and pain.  Respiratory: Negative for cough and shortness of breath.   Cardiovascular: Positive for leg swelling. Negative for chest pain.       Legs chronically  swollen. Occasionally painful  Gastrointestinal: Negative for abdominal pain, constipation, diarrhea and heartburn.  Genitourinary: Negative for dysuria and frequency.  Musculoskeletal: Negative for falls, joint pain and myalgias.  Neurological: Negative for dizziness, seizures and headaches.  Endo/Heme/Allergies: Negative for environmental allergies. Does not bruise/bleed easily.  Psychiatric/Behavioral: Negative for depression. The patient is not nervous/anxious and does not have insomnia.     BP 112/72 (BP Location: Right Arm, Patient Position: Sitting, Cuff Size: Large)   Pulse 68   Temp 98 F (36.7 C) (Temporal)   Resp 18   Ht 6' (1.829 m)   Wt 281 lb (127.5 kg)   SpO2 97%   BMI 38.11 kg/m   Physical Exam  Constitutional: He is oriented to person, place, and time. He appears well-developed and well-nourished.  HENT:  Head: Normocephalic and atraumatic.  Mouth/Throat: Oropharynx is clear and moist.  Eyes: Pupils are equal, round, and reactive to light. Conjunctivae are normal.  Neck: Normal range of motion. Neck supple. No thyromegaly present.  Cardiovascular: Normal rate, regular rhythm and normal heart sounds.   Pulmonary/Chest: Effort normal and breath sounds normal. No respiratory distress.  Abdominal: Soft. Bowel sounds are normal.  Musculoskeletal: Normal range of motion. He exhibits edema.  Both lower legs with thickened irregular skin, areas of scarring, large amount of edema, no open skin or cellulitis  Lymphadenopathy:    He has no cervical adenopathy.  Neurological: He is alert and oriented to person, place, and time.  Gait normal  Skin: Skin is warm and dry.  Psychiatric: He has a normal mood and affect. His behavior is normal. Thought content normal.  Nursing note and vitals reviewed.  ASSESSMENT/PLAN:  1. Encounter for hepatitis C screening test for low risk patient Discussed need for hepatitis C screening in the baby boomers - Hepatitis C antibody  2.  Sickle cell trait (HCC) - CBC - COMPLETE METABOLIC PANEL WITH GFR - Urinalysis, Routine w reflex microscopic  3. Chronic anticoagulation Discussed precautions against falls. Discussion about not taking an NSAID medicine due to risk of GI bleed.  4. Pedal edema  managed. When necessary elevation. When necessary compression stocking  5. Screening for cholesterol level Overdue. Discussed need for screening for arteriovascular disease with known venous vascular disease. Discussed importance of smoking cessation - Lipid panel  6. 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  Dispense: 30 tablet; Refill: 3  7. Need for immunization against influenza done - Flu Vaccine QUAD 36+ mos IM Greater than 50% of this visit was spent in counseling and coordinating care.  Total face to face time:   45 minutes. Discussed preventative medicine, health risk of  smoking, healthy lifestyle  Patient Instructions  Need labs today Continue to eat well and stay active See me in a month Consider stopping smoking    Raylene Everts, MD

## 2017-09-14 NOTE — Patient Instructions (Signed)
Need labs today Continue to eat well and stay active See me in a month Consider stopping smoking

## 2017-10-15 ENCOUNTER — Ambulatory Visit: Payer: Medicare Other | Admitting: Family Medicine

## 2017-12-02 ENCOUNTER — Ambulatory Visit (HOSPITAL_COMMUNITY): Payer: Medicare Other | Admitting: Adult Health

## 2017-12-02 ENCOUNTER — Other Ambulatory Visit (HOSPITAL_COMMUNITY): Payer: Medicare Other

## 2017-12-02 ENCOUNTER — Ambulatory Visit (HOSPITAL_COMMUNITY): Payer: Medicare Other | Admitting: Oncology

## 2017-12-03 ENCOUNTER — Other Ambulatory Visit (HOSPITAL_COMMUNITY): Payer: Medicare Other

## 2017-12-03 ENCOUNTER — Ambulatory Visit (HOSPITAL_COMMUNITY): Payer: Medicare Other | Admitting: Oncology

## 2017-12-03 NOTE — Assessment & Plan Note (Deleted)
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  He is currently chronically anticoagulated with Xarelto.  He has seen Dr. Christoper Allegra at [facility] who found that he also has sickle cell trait which increases the patient's risk for DVTs.  He did not have anticardiolipin antibodies, or beta-2 glycoprotein antibodies.  It is believed by both, Dr. Mare Ferrari and [place], that he is not a good candidate for ongoing work and is a candidate for permanent disability.  Current anticoagulation consists of Xarelto for presumed Coumadin failure (although compliance was more likely the culprit) when he developed a lower extremity DVT while on vitamin K antagonist therapy.  No hematologic reason for blood work today.  He is following with primary care provider, Dr. Meda Coffee, and there are orders placed for laboratory work in the future.  Compliance with daily anticoagulation is encouraged.  Return in 6 months for ongoing follow-up.

## 2017-12-03 NOTE — Progress Notes (Deleted)
No show

## 2017-12-07 ENCOUNTER — Ambulatory Visit (HOSPITAL_COMMUNITY): Payer: Medicare Other | Admitting: Hematology and Oncology

## 2017-12-07 ENCOUNTER — Other Ambulatory Visit (HOSPITAL_COMMUNITY): Payer: Medicare Other

## 2017-12-08 DIAGNOSIS — N390 Urinary tract infection, site not specified: Secondary | ICD-10-CM | POA: Diagnosis not present

## 2017-12-08 DIAGNOSIS — H1031 Unspecified acute conjunctivitis, right eye: Secondary | ICD-10-CM | POA: Diagnosis not present

## 2018-02-07 ENCOUNTER — Encounter: Payer: Self-pay | Admitting: Family Medicine

## 2018-02-22 ENCOUNTER — Other Ambulatory Visit: Payer: Self-pay | Admitting: Family Medicine

## 2018-02-22 DIAGNOSIS — I82503 Chronic embolism and thrombosis of unspecified deep veins of lower extremity, bilateral: Secondary | ICD-10-CM

## 2018-03-08 ENCOUNTER — Telehealth: Payer: Self-pay | Admitting: Family Medicine

## 2018-03-08 ENCOUNTER — Other Ambulatory Visit: Payer: Self-pay | Admitting: Family Medicine

## 2018-03-08 NOTE — Telephone Encounter (Signed)
Can he have a 3 month supply?

## 2018-03-08 NOTE — Telephone Encounter (Signed)
No.  He came once and did not keep follow up or follow my recommendations.  He can call hematology.  He was given 30 d to last until I leave.

## 2018-03-08 NOTE — Telephone Encounter (Signed)
Request refill for 3 months XARELTO 20 MG TABS tablet [[id number]]

## 2018-03-08 NOTE — Telephone Encounter (Signed)
Per Dr.Nelson, let patient know we would be unable to refill Xarelto for 90 days. He would have to call hematology. He verbalized understanding.

## 2018-05-03 DIAGNOSIS — R5383 Other fatigue: Secondary | ICD-10-CM | POA: Diagnosis not present

## 2018-05-03 DIAGNOSIS — Z131 Encounter for screening for diabetes mellitus: Secondary | ICD-10-CM | POA: Diagnosis not present

## 2018-05-03 DIAGNOSIS — E669 Obesity, unspecified: Secondary | ICD-10-CM | POA: Diagnosis not present

## 2018-05-03 DIAGNOSIS — Z125 Encounter for screening for malignant neoplasm of prostate: Secondary | ICD-10-CM | POA: Diagnosis not present

## 2018-05-03 DIAGNOSIS — Z13220 Encounter for screening for lipoid disorders: Secondary | ICD-10-CM | POA: Diagnosis not present

## 2018-05-03 DIAGNOSIS — E559 Vitamin D deficiency, unspecified: Secondary | ICD-10-CM | POA: Diagnosis not present

## 2018-05-03 DIAGNOSIS — Q260 Congenital stenosis of vena cava: Secondary | ICD-10-CM | POA: Diagnosis not present

## 2018-05-03 DIAGNOSIS — I82409 Acute embolism and thrombosis of unspecified deep veins of unspecified lower extremity: Secondary | ICD-10-CM | POA: Diagnosis not present

## 2018-05-03 DIAGNOSIS — I10 Essential (primary) hypertension: Secondary | ICD-10-CM | POA: Diagnosis not present

## 2018-06-28 DIAGNOSIS — I10 Essential (primary) hypertension: Secondary | ICD-10-CM | POA: Diagnosis not present

## 2018-06-28 DIAGNOSIS — E669 Obesity, unspecified: Secondary | ICD-10-CM | POA: Diagnosis not present

## 2018-06-28 DIAGNOSIS — E559 Vitamin D deficiency, unspecified: Secondary | ICD-10-CM | POA: Diagnosis not present

## 2018-06-28 DIAGNOSIS — R5383 Other fatigue: Secondary | ICD-10-CM | POA: Diagnosis not present

## 2018-08-25 ENCOUNTER — Encounter (HOSPITAL_COMMUNITY): Payer: Self-pay

## 2018-09-21 DIAGNOSIS — E669 Obesity, unspecified: Secondary | ICD-10-CM | POA: Diagnosis not present

## 2018-09-21 DIAGNOSIS — E559 Vitamin D deficiency, unspecified: Secondary | ICD-10-CM | POA: Diagnosis not present

## 2018-09-21 DIAGNOSIS — I10 Essential (primary) hypertension: Secondary | ICD-10-CM | POA: Diagnosis not present

## 2019-10-10 ENCOUNTER — Other Ambulatory Visit: Payer: Self-pay

## 2019-10-10 DIAGNOSIS — Z20822 Contact with and (suspected) exposure to covid-19: Secondary | ICD-10-CM

## 2019-10-12 LAB — NOVEL CORONAVIRUS, NAA: SARS-CoV-2, NAA: NOT DETECTED

## 2019-10-14 ENCOUNTER — Telehealth: Payer: Self-pay | Admitting: Internal Medicine

## 2019-10-14 NOTE — Telephone Encounter (Signed)
Patient informed of negative covid result.  °

## 2019-10-24 ENCOUNTER — Encounter (INDEPENDENT_AMBULATORY_CARE_PROVIDER_SITE_OTHER): Payer: Self-pay | Admitting: Internal Medicine

## 2019-10-24 ENCOUNTER — Other Ambulatory Visit: Payer: Self-pay

## 2019-10-24 ENCOUNTER — Ambulatory Visit (INDEPENDENT_AMBULATORY_CARE_PROVIDER_SITE_OTHER): Payer: Medicare Other | Admitting: Internal Medicine

## 2019-10-24 VITALS — BP 133/78 | HR 68 | Temp 98.1°F | Resp 18 | Ht 73.0 in | Wt 280.0 lb

## 2019-10-24 DIAGNOSIS — I87009 Postthrombotic syndrome without complications of unspecified extremity: Secondary | ICD-10-CM

## 2019-10-24 DIAGNOSIS — N1831 Chronic kidney disease, stage 3a: Secondary | ICD-10-CM | POA: Diagnosis not present

## 2019-10-24 DIAGNOSIS — E559 Vitamin D deficiency, unspecified: Secondary | ICD-10-CM

## 2019-10-24 DIAGNOSIS — R5381 Other malaise: Secondary | ICD-10-CM | POA: Diagnosis not present

## 2019-10-24 DIAGNOSIS — R7303 Prediabetes: Secondary | ICD-10-CM | POA: Diagnosis not present

## 2019-10-24 DIAGNOSIS — I2692 Saddle embolus of pulmonary artery without acute cor pulmonale: Secondary | ICD-10-CM | POA: Diagnosis not present

## 2019-10-24 DIAGNOSIS — I2782 Chronic pulmonary embolism: Secondary | ICD-10-CM

## 2019-10-24 DIAGNOSIS — N183 Chronic kidney disease, stage 3 unspecified: Secondary | ICD-10-CM

## 2019-10-24 DIAGNOSIS — L0390 Cellulitis, unspecified: Secondary | ICD-10-CM

## 2019-10-24 DIAGNOSIS — I82503 Chronic embolism and thrombosis of unspecified deep veins of lower extremity, bilateral: Secondary | ICD-10-CM

## 2019-10-24 DIAGNOSIS — R5383 Other fatigue: Secondary | ICD-10-CM

## 2019-10-24 HISTORY — DX: Chronic kidney disease, stage 3 unspecified: N18.30

## 2019-10-24 HISTORY — DX: Prediabetes: R73.03

## 2019-10-24 MED ORDER — RIVAROXABAN 20 MG PO TABS: 20.0000 mg | ORAL_TABLET | Freq: Every day | ORAL | 2 refills | Status: DC

## 2019-10-24 NOTE — Progress Notes (Signed)
Metrics: Intervention Frequency ACO  Documented Smoking Status Yearly  Screened one or more times in 24 months  Cessation Counseling or  Active cessation medication Past 24 months  Past 24 months   Guideline developer: UpToDate (See UpToDate for funding source) Date Released: 2014       Wellness Office Visit  Subjective:  Patient ID: Matthew Wise, male    DOB: [birth year]  Age: 58 y.o. MRN: [id number]  CC: This man comes in after high dose of more than 1 year.  He is complaining of left lower leg ulcers/wounds. HPI He was supposed to be taking Xarelto chronically for thromboembolic disease but ran out of the medication about a month ago. He also used to have left lower leg ulcers in the past which did heal but these seem to have reappeared in the last month or so.  He denies any fever. He also has chronic kidney disease He also was prediabetic when his blood work was checked about a year ago with a hemoglobin A1c of 5.7%.  Past Medical History:  Diagnosis Date  . Chronic anticoagulation 11/14/2014  . CKD (chronic kidney disease) stage 3, GFR 30-59 ml/min 10/24/2019  . CKD (chronic kidney disease) stage 3, GFR 30-59 ml/min 10/24/2019  . Clotting disorder ([place])   . 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 DVT (deep venous thrombosis) ([place]) 06/15/2011  . DVT of leg (deep venous thrombosis) (HCC)    rt. leg  . H/O PE (pulmonary embolism) 06/15/2011  . 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 Prediabetes 10/24/2019      Family History  Problem Relation Age of Onset  . COPD Father   . Stroke Brother   . Pulmonary embolism Sister   . Throat cancer Brother   . Arthritis Mother   . Diabetes Mother   . Stroke Mother   . Colon cancer Neg Hx     Social History   Social History Narrative   Lives with son   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s   Mows lawns some   Social  History   Tobacco Use  . Smoking status: Current Every Day Smoker    Packs/day: 1.00    Years: 30.00    Pack years: 30.00    Types: Cigarettes    Start date: 12/01/1983  . Smokeless tobacco: Never Used  Substance Use Topics  . Alcohol use: Yes    Alcohol/week: 5.0 standard drinks    Types: 5 Cans of beer per week    Comment: 2-3 beers a day    Current Meds  Medication Sig  . rivaroxaban (XARELTO) 20 MG TABS tablet Take 1 tablet (20 mg total) by mouth daily with supper.  . [DISCONTINUED] XARELTO 20 MG TABS tablet TAKE 1 TABLET BY MOUTH ONCE DAILY WITH SUPPER.        Objective:   Today's Vitals: BP 133/78 (BP Location: Left Arm, Patient Position: Sitting, Cuff Size: Normal)   Pulse 68   Temp 98.1 F (36.7 C) (Temporal)   Resp 18   Ht 6' 1"  (1.854 m)   Wt 280 lb (127 kg)   SpO2 96%   BMI 36.94 kg/m  Vitals with BMI 10/24/2019 09/14/2017 06/03/2017  Height 6' 1"  6' 0"  -  Weight 280 lbs 281 lbs -  BMI 40.98 11.9 -  Systolic 147 829 562  Diastolic 78 72 76  Pulse 68  76 -     Physical Exam  He is obese.  Blood pressure is reasonable.  He does have 2-3 wounds on his medial aspect of his left lower leg around the ankle area.  This may represent postphlebitic syndrome and chronic venous insufficiency.    Assessment   1. Post-phlebitic syndrome   2. 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 Stage 3a chronic kidney disease   4. Prediabetes   5. Malaise and fatigue   6. Vitamin D deficiency disease   7. 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 Wound cellulitis       Tests ordered Orders Placed This Encounter  Procedures  . CMP with eGFR([facility])  . CBC  . Hemoglobin A1c  . Vitamin D, 25-hydroxy     Plan: 1. Blood work is ordered as above. 2. He may need to see a nephrologist in view of his chronic kidney disease to investigate further. 3. He also was deficient in vitamin D and  we will see how he is now. 4. I will send him to the wound care center for further treatment of the wounds on the left lower leg. 5. I will have him follow-up with Judson Roch in early January for an annual Medicare wellness visit.  I hope he keeps these appointments.   Meds ordered this encounter  Medications  . rivaroxaban (XARELTO) 20 MG TABS tablet    Sig: Take 1 tablet (20 mg total) by mouth daily with supper.    Dispense:  30 tablet    Refill:  2    Nimish Luther Parody, MD

## 2019-10-25 LAB — CBC
HCT: 43.3 % (ref 38.5–50.0)
Hemoglobin: 14.9 g/dL (ref 13.2–17.1)
MCH: 30.3 pg (ref 27.0–33.0)
MCHC: 34.4 g/dL (ref 32.0–36.0)
MCV: 88 fL (ref 80.0–100.0)
MPV: 10.2 fL (ref 7.5–12.5)
Platelets: 218 10*3/uL (ref 140–400)
RBC: 4.92 10*6/uL (ref 4.20–5.80)
RDW: 13.1 % (ref 11.0–15.0)
WBC: 4.4 10*3/uL (ref 3.8–10.8)

## 2019-10-25 LAB — COMPLETE METABOLIC PANEL WITH GFR
AG Ratio: 1.3 (calc) (ref 1.0–2.5)
ALT: 16 U/L (ref 9–46)
AST: 18 U/L (ref 10–35)
Albumin: 4.1 g/dL (ref 3.6–5.1)
Alkaline phosphatase (APISO): 83 U/L (ref 35–144)
BUN/Creatinine Ratio: 7 (calc) (ref 6–22)
BUN: 14 mg/dL (ref 7–25)
CO2: 31 mmol/L (ref 20–32)
Calcium: 9.5 mg/dL (ref 8.6–10.3)
Chloride: 101 mmol/L (ref 98–110)
Creat: 2.07 mg/dL — ABNORMAL HIGH (ref 0.70–1.33)
GFR, Est African American: 40 mL/min/{1.73_m2} — ABNORMAL LOW (ref >=60)
GFR, Est Non African American: 35 mL/min/{1.73_m2} — ABNORMAL LOW (ref >=60)
Globulin: 3.2 g/dL (calc) (ref 1.9–3.7)
Glucose, Bld: 183 mg/dL — ABNORMAL HIGH (ref 65–99)
Potassium: 4.4 mmol/L (ref 3.5–5.3)
Sodium: 138 mmol/L (ref 135–146)
Total Bilirubin: 0.9 mg/dL (ref 0.2–1.2)
Total Protein: 7.3 g/dL (ref 6.1–8.1)

## 2019-10-25 LAB — VITAMIN D 25 HYDROXY (VIT D DEFICIENCY, FRACTURES): Vit D, 25-Hydroxy: 12 ng/mL — ABNORMAL LOW (ref 30–100)

## 2019-10-25 LAB — HEMOGLOBIN A1C
Hgb A1c MFr Bld: 5.5 % of total Hgb (ref <5.7)
Mean Plasma Glucose: 111 (calc)
eAG (mmol/L): 6.2 (calc)

## 2019-11-01 NOTE — Progress Notes (Signed)
Patient called.  Discussed lab results.of blood work. Gave instructions to START the Vitamin D 10,000 IU. Pt was surprise of results. Stated he hs not been drinking hardly any water. Dr Laroy Apple office will contact him to set appointment. If he does not here from them in the next 3-5 business day to call office back to f/u with referral with Maureen Duesing.

## 2019-11-09 ENCOUNTER — Ambulatory Visit (INDEPENDENT_AMBULATORY_CARE_PROVIDER_SITE_OTHER): Payer: Medicare Other | Admitting: Orthopedic Surgery

## 2019-11-09 ENCOUNTER — Other Ambulatory Visit (INDEPENDENT_AMBULATORY_CARE_PROVIDER_SITE_OTHER): Payer: Self-pay

## 2019-11-09 ENCOUNTER — Encounter: Payer: Self-pay | Admitting: Orthopedic Surgery

## 2019-11-09 ENCOUNTER — Other Ambulatory Visit: Payer: Self-pay

## 2019-11-09 DIAGNOSIS — I87332 Chronic venous hypertension (idiopathic) with ulcer and inflammation of left lower extremity: Secondary | ICD-10-CM

## 2019-11-09 DIAGNOSIS — L97929 Non-pressure chronic ulcer of unspecified part of left lower leg with unspecified severity: Secondary | ICD-10-CM | POA: Diagnosis not present

## 2019-11-09 DIAGNOSIS — N1831 Chronic kidney disease, stage 3a: Secondary | ICD-10-CM

## 2019-11-09 NOTE — Progress Notes (Signed)
Office Visit Note   Patient: Matthew Wise           Date of Birth: 04/09/1961           MRN: [id number] Visit Date: 11/09/2019              Requested by: Doree Albee, MD [address] [place],  [place] [address] PCP: Doree Albee, MD  No chief complaint on file.     HPI: Patient is a 58 year old gentleman who presents for venous swelling with ulceration left lower extremity.  Patient complains of drainage pain has not been doing dressing changes or using compression.  He states he has had previous ulcers in the past.  Patient states that he has had a DVT years ago and is still on Xarelto.  Medical history negative for diabetes positive for chronic kidney disease.  Assessment & Plan: Visit Diagnoses: No diagnosis found.  Plan: Plan: We will apply Dynaflex compression wrap proceed with serial compression wraps until his leg is sufficient size and we can place him in a compression stocking.  Patient states he does have some stockings at home and we will evaluate these at his next follow-up appointment.  Follow-Up Instructions: No follow-ups on file.   Ortho Exam  Patient is alert, oriented, no adenopathy, well-dressed, normal affect, normal respiratory effort. Examination patient has venous stasis swelling of the left lower extremity has a good pulse there is brawny edema there is ulceration medially to the calf.  The calf circumference is 54 cm.  There is no cellulitis no signs of infection dorsiflexion of the ankle is nonpainful no signs of recurrent DVT.  Imaging: No results found. No images are attached to the encounter.  Labs: Lab Results  Component Value Date   HGBA1C 5.5 10/24/2019   HGBA1C 5.6 03/16/2012   REPTSTATUS 02/09/2015 FINAL 02/04/2015   CULT NO GROWTH 5 DAYS 02/04/2015     Lab Results  Component Value Date   ALBUMIN 4.0 06/03/2017   ALBUMIN 4.1 05/22/2015   ALBUMIN 2.7 (L) 02/05/2015    Lab Results  Component Value Date   MG 2.1  02/04/2015   Lab Results  Component Value Date   VD25OH 12 (L) 10/24/2019    No results found for: PREALBUMIN CBC EXTENDED Latest Ref Rng & Units 10/24/2019 06/03/2017 07/27/2016  WBC 3.8 - 10.8 Thousand/uL 4.4 4.4 5.4  RBC 4.20 - 5.80 Million/uL 4.92 5.24 5.23  HGB 13.2 - 17.1 g/dL 14.9 15.9 16.3  HCT 38.5 - 50.0 % 43.3 45.6 46.3  PLT 140 - 400 Thousand/uL 218 192 180  NEUTROABS 1.7 - 7.7 K/uL - 1.9 2.4  LYMPHSABS 0.7 - 4.0 K/uL - 1.8 2.2     There is no height or weight on file to calculate BMI.  Orders:  No orders of the defined types were placed in this encounter.  No orders of the defined types were placed in this encounter.    Procedures: No procedures performed  Clinical Data: No additional findings.  ROS:  All other systems negative, except as noted in the HPI. Review of Systems  Objective: Vital Signs: There were no vitals taken for this visit.  Specialty Comments:  No specialty comments available.  PMFS History: Patient Active Problem List   Diagnosis Date Noted  . CKD (chronic kidney disease) stage 3, GFR 30-59 ml/min 10/24/2019  . Prediabetes 10/24/2019  . Pedal edema 09/14/2017  . Tubular adenoma of colon 09/14/2017  . Chronic anticoagulation 11/14/2014  .  Sickle cell trait ([place]) 08/29/2012  . 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 DVT (deep venous thrombosis) ([place]) 06/15/2011  . Pulmonary embolism ([place]) 06/15/2011  . 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   Past Medical History:  Diagnosis Date  . Chronic anticoagulation 11/14/2014  . CKD (chronic kidney disease) stage 3, GFR 30-59 ml/min 10/24/2019  . CKD (chronic kidney disease) stage 3, GFR 30-59 ml/min 10/24/2019  . Clotting disorder ([place])   . 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 DVT (deep venous thrombosis) ([place]) 06/15/2011  . DVT of  leg (deep venous thrombosis) (HCC)    rt. leg  . H/O PE (pulmonary embolism) 06/15/2011  . 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 Prediabetes 10/24/2019    Family History  Problem Relation Age of Onset  . COPD Father   . Stroke Brother   . Pulmonary embolism Sister   . Throat cancer Brother   . Arthritis Mother   . Diabetes Mother   . Stroke Mother   . Colon cancer Neg Hx     Past Surgical History:  Procedure Laterality Date  . COLONOSCOPY WITH PROPOFOL N/A 10/29/2015   Procedure: COLONOSCOPY WITH PROPOFOL;  Surgeon: Danie Binder, MD;  Location[facility];  Service: Endoscopy;  Laterality: N/A;  930  . None to date  10/07/15   Social History   Occupational History  . Occupation: disability    Comment: DVT  Tobacco Use  . Smoking status: Current Every Day Smoker    Packs/day: 1.00    Years: 30.00    Pack years: 30.00    Types: Cigarettes    Start date: 12/01/1983  . Smokeless tobacco: Never Used  Substance and Sexual Activity  . Alcohol use: Yes    Alcohol/week: 5.0 standard drinks    Types: 5 Cans of beer per week    Comment: 2-3 beers a day  . Drug use: No    Comment: REMOTE HISTORY, Quit in 2007  . Sexual activity: Yes

## 2019-11-09 NOTE — Progress Notes (Signed)
Pt has a referral sent to [facility] office to be seen per Dr.Gosrani request.

## 2019-11-10 ENCOUNTER — Encounter: Payer: Self-pay | Admitting: Orthopedic Surgery

## 2019-11-16 ENCOUNTER — Ambulatory Visit: Payer: Medicare Other | Admitting: Orthopedic Surgery

## 2019-11-29 ENCOUNTER — Other Ambulatory Visit (HOSPITAL_COMMUNITY): Payer: Self-pay | Admitting: Nephrology

## 2019-11-29 DIAGNOSIS — N1832 Chronic kidney disease, stage 3b: Secondary | ICD-10-CM

## 2019-11-30 DIAGNOSIS — R600 Localized edema: Secondary | ICD-10-CM | POA: Diagnosis not present

## 2019-11-30 DIAGNOSIS — Z79899 Other long term (current) drug therapy: Secondary | ICD-10-CM | POA: Diagnosis not present

## 2019-11-30 DIAGNOSIS — Z716 Tobacco abuse counseling: Secondary | ICD-10-CM | POA: Diagnosis not present

## 2019-11-30 DIAGNOSIS — N1832 Chronic kidney disease, stage 3b: Secondary | ICD-10-CM | POA: Diagnosis not present

## 2019-11-30 DIAGNOSIS — I129 Hypertensive chronic kidney disease with stage 1 through stage 4 chronic kidney disease, or unspecified chronic kidney disease: Secondary | ICD-10-CM | POA: Diagnosis not present

## 2019-11-30 DIAGNOSIS — D631 Anemia in chronic kidney disease: Secondary | ICD-10-CM | POA: Diagnosis not present

## 2019-11-30 DIAGNOSIS — E6609 Other obesity due to excess calories: Secondary | ICD-10-CM | POA: Diagnosis not present

## 2019-12-05 ENCOUNTER — Encounter (INDEPENDENT_AMBULATORY_CARE_PROVIDER_SITE_OTHER): Payer: Self-pay | Admitting: Nurse Practitioner

## 2019-12-05 ENCOUNTER — Ambulatory Visit (INDEPENDENT_AMBULATORY_CARE_PROVIDER_SITE_OTHER): Payer: Medicare Other | Admitting: Nurse Practitioner

## 2019-12-05 VITALS — Ht 72.0 in | Wt 285.0 lb

## 2019-12-05 DIAGNOSIS — I82503 Chronic embolism and thrombosis of unspecified deep veins of lower extremity, bilateral: Secondary | ICD-10-CM | POA: Diagnosis not present

## 2019-12-05 DIAGNOSIS — F17210 Nicotine dependence, cigarettes, uncomplicated: Secondary | ICD-10-CM

## 2019-12-05 DIAGNOSIS — Z1211 Encounter for screening for malignant neoplasm of colon: Secondary | ICD-10-CM | POA: Diagnosis not present

## 2019-12-05 DIAGNOSIS — Z1159 Encounter for screening for other viral diseases: Secondary | ICD-10-CM | POA: Diagnosis not present

## 2019-12-05 DIAGNOSIS — Z0000 Encounter for general adult medical examination without abnormal findings: Secondary | ICD-10-CM | POA: Diagnosis not present

## 2019-12-05 DIAGNOSIS — F17200 Nicotine dependence, unspecified, uncomplicated: Secondary | ICD-10-CM | POA: Insufficient documentation

## 2019-12-05 MED ORDER — NICOTINE 14 MG/24HR TD PT24: 14.0000 mg | MEDICATED_PATCH | Freq: Every day | TRANSDERMAL | 2 refills | Status: DC

## 2019-12-05 MED ORDER — RIVAROXABAN 20 MG PO TABS: 20.0000 mg | ORAL_TABLET | Freq: Every day | ORAL | 2 refills | Status: DC

## 2019-12-05 NOTE — Patient Instructions (Signed)
Thank you for choosing [facility] as your medical provider! If you have any questions or concerns regarding your health care, please do not hesitate to call our office.  Health maintenance: I encourage you to get the flu shot, and consider getting the shingles vaccine.  You can get the flu shot this office and get the shingles vaccine at your pharmacy if you wish.  I have referred you back to your gastroenterologist to see if you are up-to-date with your colon cancer screening.  They should be calling you shortly to discuss this with you.  Try to quit cigarettes like we discussed in the office visit.  I have prescribed nicotine patches for you to help with this.  Next time you get blood work drawn we can check your blood for hepatitis C.  If you decide you would like to be screened for prostate cancer by getting your PSA level checked in your blood please let me know.  If you decide you want to undergo lung cancer screening via CT scan of the lungs please let me know.  Please follow-up as scheduled in 2 months. We look forward to seeing you again soon! Have a great Valentine's Day!!  At [facility] we value your feedback. You may receive a survey about your visit today. Please share your experience as we strive to create trusting relationships with our patients to provide genuine, compassionate, quality care.  We appreciate your understanding and patience as we review any laboratory studies, imaging, and other diagnostic tests that are ordered as we care for you. We do our best to address any and all results in a timely manner. If you do not hear about test results within 1 week, please do not hesitate to contact us. If we referred you to a specialist during your visit or ordered imaging testing, contact the office if you have not been contacted to be scheduled within 1 weeks.  We also encourage the use of MyChart, which contains your medical information for your review as well. If  you are not enrolled in this feature, an access code is on this after visit summary for your convenience. Thank you for allowing [place] to be involved in your care.   '

## 2019-12-05 NOTE — Progress Notes (Signed)
Due to national recommendations of social distancing related to the [place] pandemic, an audio/visual tele-health visit was felt to be the most appropriate encounter type for this patient today. I connected with  Matthew Wise on 12/05/19 utilizing audio/visual technology and verified that I am speaking with the correct person using two identifiers. The patient was located at their home, and I was located at the office of [facility] during the encounter. I discussed the limitations of evaluation and management by telemedicine. The patient expressed understanding and agreed to proceed.     Subjective:   Matthew Wise is a 59 y.o. male who presents for Medicare Annual/Subsequent preventive examination.  He also mentions to me we are reviewing his medications that he is not currently taking his Xarelto because it was too expensive last time he went to the pharmacy.  He does mention that it could have been because he was in the donut hole.  Review of Systems:  All systems reviewed and negative. Cardiac Risk Factors include: advanced age (>28men, >16 women);obesity (BMI >30kg/m2)     Objective:    Vitals: Ht 6' (1.829 m)   Wt 285 lb (129.3 kg)   BMI 38.65 kg/m   Body mass index is 38.65 kg/m.  Advanced Directives 12/05/2019 06/03/2017 10/07/2016 09/17/2016 08/07/2016 07/15/2016 05/07/2016  Does Patient Have a Medical Advance Directive? No No No No No No No  Would patient like information on creating a medical advance directive? No - Patient declined No - Patient declined No - patient declined information No - patient declined information No - patient declined information No - patient declined information -    Tobacco Social History   Tobacco Use  Smoking Status Current Every Day Smoker  . Packs/day: 1.00  . Years: 30.00  . Pack years: 30.00  . Types: Cigarettes  . Start date: 12/01/1983  Smokeless Tobacco Never Used  Tobacco Comment   counseled     Ready to quit:  Yes Counseling given: Yes Comment: counseled   Clinical Intake:  Pre-visit preparation completed: Yes  Pain : No/denies pain     Nutritional Status: BMI 25 -29 Overweight Nutritional Risks: None Diabetes: No  How often do you need to have someone help you when you read instructions, pamphlets, or other written materials from your doctor or pharmacy?: 1 - Never What is the last grade level you completed in school?: 12th grade     Information entered by :: Jeralyn Ruths, NP-C  Past Medical History:  Diagnosis Date  . Chronic anticoagulation 11/14/2014  . CKD (chronic kidney disease) stage 3, GFR 30-59 ml/min 10/24/2019  . CKD (chronic kidney disease) stage 3, GFR 30-59 ml/min 10/24/2019  . Clotting disorder ([place])   . 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 DVT (deep venous thrombosis) ([place]) 06/15/2011  . DVT of leg (deep venous thrombosis) (HCC)    rt. leg  . H/O PE (pulmonary embolism) 06/15/2011  . 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 Prediabetes 10/24/2019   Past Surgical History:  Procedure Laterality Date  . COLONOSCOPY WITH PROPOFOL N/A 10/29/2015   Procedure: COLONOSCOPY WITH PROPOFOL;  Surgeon: Danie Binder, MD;  [facility];  Service: Endoscopy;  Laterality: N/A;  930  . None to date  10/07/15   Family History  Problem Relation Age of Onset  . COPD Father   . Stroke Brother   .  Pulmonary embolism Sister   . Throat cancer Brother   . Arthritis Mother   . Diabetes Mother   . Stroke Mother   . Hypertension Brother   . Diabetes Brother   . Arthritis Brother   . Diabetes Sister   . Colon cancer Neg Hx    Social History   Socioeconomic History  . Marital status: Divorced    Spouse name: Not on file  . Number of children: 2  . Years of education: 10  . Highest education level: Not on file  Occupational History  . Occupation: disability     Comment: DVT  Tobacco Use  . Smoking status: Current Every Day Smoker    Packs/day: 1.00    Years: 30.00    Pack years: 30.00    Types: Cigarettes    Start date: 12/01/1983  . Smokeless tobacco: Never Used  . Tobacco comment: counseled  Substance and Sexual Activity  . Alcohol use: Yes    Alcohol/week: 3.0 standard drinks    Types: 3 Cans of beer per week    Comment: 2-3 beers a day  . Drug use: Not Currently    Comment: REMOTE HISTORY, Quit in 2007  . Sexual activity: Yes  Other Topics Concern  . Not on file  Social History Narrative   Lives with son   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s   Mows lawns some   Social Determinants of Health   Financial Resource Strain:   . Difficulty of Paying Living Expenses: Not on file  Food Insecurity:   . Worried About Charity fundraiser in the Last Year: Not on file  . Ran Out of Food in the Last Year: Not on file  Transportation Needs:   . Lack of Transportation (Medical): Not on file  . Lack of Transportation (Non-Medical): Not on file  Physical Activity:   . Days of Exercise per Week: Not on file  . Minutes of Exercise per Session: Not on file  Stress:   . Feeling of Stress : Not on file  Social Connections:   . Frequency of Communication with Friends and Family: Not on file  . Frequency of Social Gatherings with Friends and Family: Not on file  . Attends Religious Services: Not on file  . Active Member of Clubs or Organizations: Not on file  . Attends Archivist Meetings: Not on file  . Marital Status: Not on file    Outpatient Encounter Medications as of 12/05/2019  Medication Sig  . nicotine (NICODERM CQ - DOSED IN MG/24 HOURS) 14 mg/24hr patch Place 1 patch (14 mg total) onto the skin daily.  . rivaroxaban (XARELTO) 20 MG TABS tablet Take 1 tablet (20 mg total) by mouth daily with supper.  . [DISCONTINUED] rivaroxaban (XARELTO) 20 MG TABS tablet Take 1 tablet (20 mg total) by mouth daily with supper.  (Patient not taking: Reported on 12/05/2019)   No facility-administered encounter medications on file as of 12/05/2019.    Activities of Daily Living In your present state of health, do you have any difficulty performing the following activities: 12/05/2019  Hearing? N  Vision? N  Difficulty concentrating or making decisions? N  Walking or climbing stairs? N  Dressing or bathing? N  Doing errands, shopping? N  Preparing Food and eating ? N  Using the Toilet? N  In the past six months, have you accidently leaked urine? N  Do you have problems with loss of bowel control? N  Managing  your Medications? N  Managing your Finances? N  Housekeeping or managing your Housekeeping? N  Some recent data might be hidden    Patient Care Team: Doree Albee, MD as PCP - General (Internal Medicine) Teena Irani, PTA as Physical Therapy Assistant (Physical Therapy) Danie Binder, MD as Consulting Physician (Gastroenterology)   Assessment:   This is a routine wellness examination for [place].  Exercise Activities and Dietary recommendations Current Exercise Habits: The patient does not participate in regular exercise at present, Exercise limited by: None identified  Goals    . STOP SMOKING     Patient is interested in quitting smoking.  He tells me he will try.  We will start by prescribing him nicotine patches to aid him in his efforts.       Fall Risk Fall Risk  12/05/2019 11/14/2014 10/16/2014 09/28/2014  Falls in the past year? 0 No No No  Number falls in past yr: 0 - - -  Injury with Fall? 0 - - -   Is the patient's home free of loose throw rugs in walkways, pet beds, electrical cords, etc?   yes      Grab bars in the bathroom? no      Handrails on the stairs?   yes      Adequate lighting?   yes  Timed Get Up and Go Performed: Not performed today as office visit was conducted remotely  Depression Screen PHQ 2/9 Scores 12/05/2019 09/14/2017 09/28/2014  PHQ - 2 Score 0 0 0     Cognitive Function     6CIT Screen 12/05/2019  What Year? 0 points  What month? 0 points  What time? 0 points  Count back from 20 0 points  Months in reverse 2 points  Repeat phrase 0 points  Total Score 2    Immunization History  Administered Date(s) Administered  . Influenza,inj,Quad PF,6+ Mos 09/14/2017  . Td 11/30/2014    Qualifies for Shingles Vaccine?  Yes  Screening Tests Health Maintenance  Topic Date Due  . Hepatitis C Screening  [birth year]  . INFLUENZA VACCINE  02/28/2020 (Originally 07/01/2019)  . TETANUS/TDAP  11/30/2024  . COLONOSCOPY  10/28/2025  . HIV Screening  Completed   Cancer Screenings: Lung: Low Dose CT Chest recommended if Age 51-80 years, 30 pack-year currently smoking OR have quit w/in 15years. Patient does qualify. --He is not interested in undergoing screening at this time. Colorectal: Unclear when his next colon cancer screening via colonoscopy is required.  Additional Screenings:  Hepatitis C Screening: He would be interested in being screened for this.      Plan:   I encouraged patient to have immunizations administered that he qualifies for.  This is mainly the flu and shingles vaccine.  He will consider this.  Last colonoscopy was completed in 2016 is not clear when his next colonoscopy should be due.  Per chart review I see that polyps were removed at his last colonoscopy.  I will refer him back to gastroenterology for further evaluation to see if he is due for colonoscopy again.  Up-to-date with sex transmitted infection screening.  He would like to quit smoking we did discuss this today.  Depression screening completed and was negative today.  He would like to undergo hepatitis C screening, I have ordered this so that next time he gets blood work taken this will be included in his blood work.  He would like to think about undergoing lung cancer screening.  He would  like to think about undergoing prostate cancer screening via PSA collection.   We also talked about advanced care planning and he is not ready at this time to fill out a MOST form, but was encouraged to let us know if he would like to fill this out in the future.  I also encouraged him to call the pharmacy to see if his blood thinner would be more affordable now that the new year has begun.  I told him if not he needs to call this office so we can find another option for him.  He tells me he understands.  I have personally reviewed and noted the following in the patient's chart:   . Medical and social history . Use of alcohol, tobacco or illicit drugs  . Current medications and supplements . Functional ability and status . Nutritional status . Physical activity . Advanced directives . List of other physicians . Hospitalizations, surgeries, and ER visits in previous 12 months . Vitals . Screenings to include cognitive, depression, and falls . Referrals and appointments  In addition, I have reviewed and discussed with patient certain preventive protocols, quality metrics, and best practice recommendations. A written personalized care plan for preventive services as well as general preventive health recommendations were provided to patient.    He will follow-up as scheduled in approximately 2 months.  Ailene Ards, NP  12/05/2019

## 2019-12-06 ENCOUNTER — Ambulatory Visit (HOSPITAL_COMMUNITY)
Admission: RE | Admit: 2019-12-06 | Discharge: 2019-12-06 | Disposition: A | Discharge status: Home / Self Care | Payer: Medicare Other | Source: Ambulatory Visit | Attending: Nephrology | Admitting: Nephrology

## 2019-12-06 ENCOUNTER — Other Ambulatory Visit: Payer: Self-pay

## 2019-12-06 DIAGNOSIS — N1832 Chronic kidney disease, stage 3b: Secondary | ICD-10-CM | POA: Diagnosis not present

## 2019-12-06 IMAGING — US US RENAL
1 series · 14 of 25 positions shown · non-contrast
Comparison: CT [DATE]

CLINICAL DATA: Stage IIIB chronic kidney disease.

EXAM:
RENAL / URINARY TRACT ULTRASOUND COMPLETE

[Series 1: us renal · 14 of 46 slices shown]
[im 1/46]
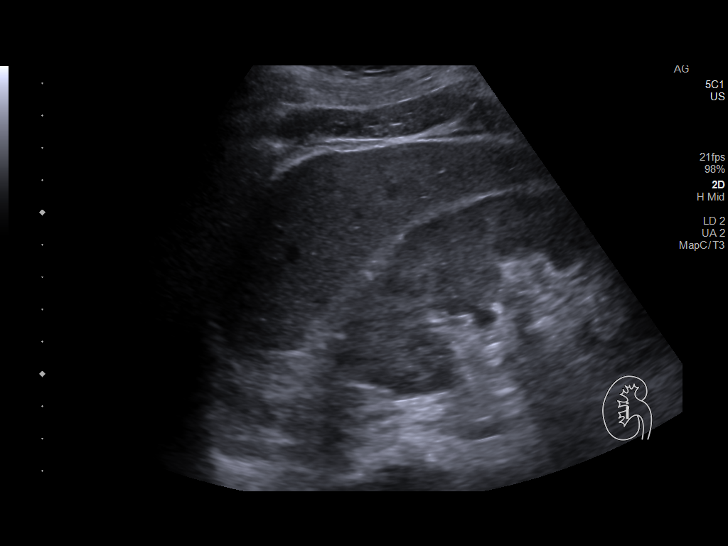
[im 4/46]
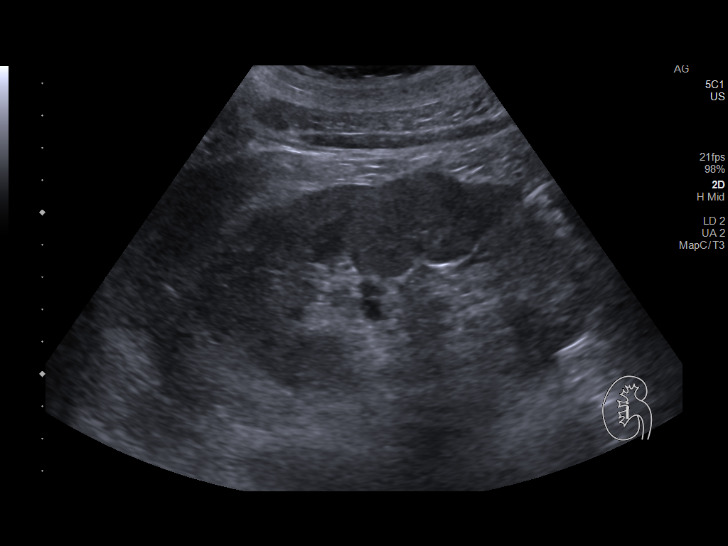
[im 8/46]
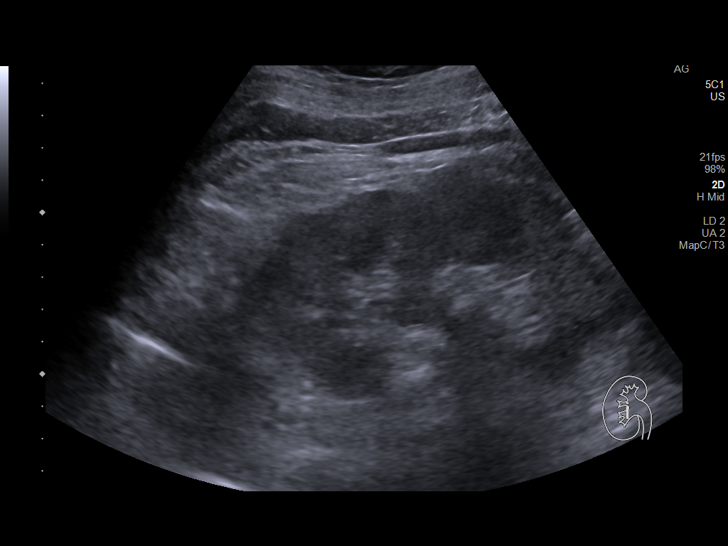
[im 12/46]
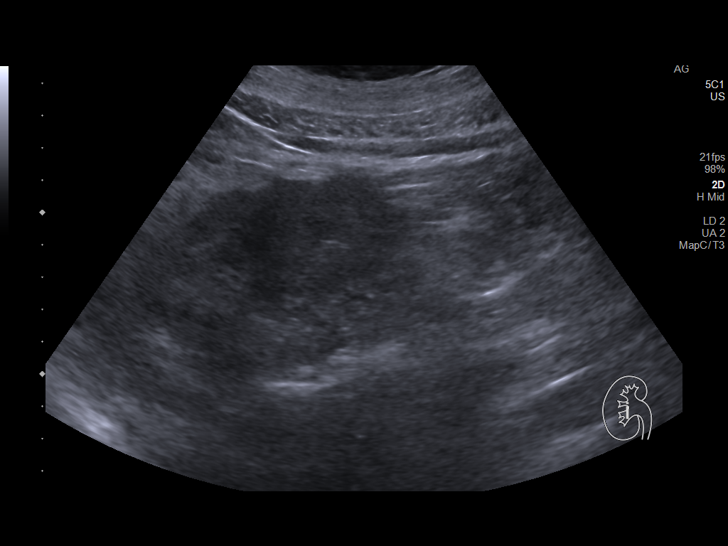
[im 16/46]
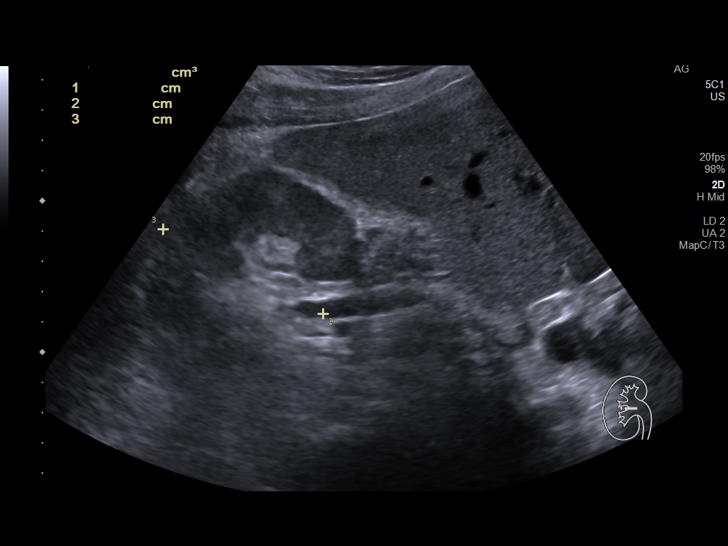
[im 17/46]
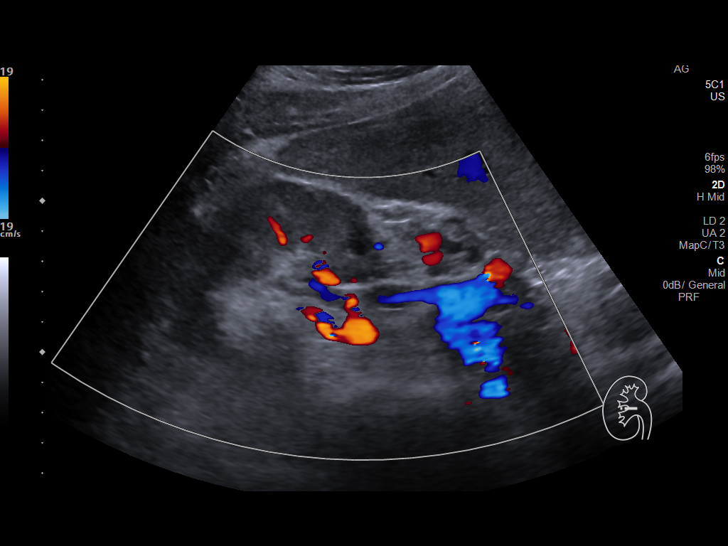
[im 21/46]
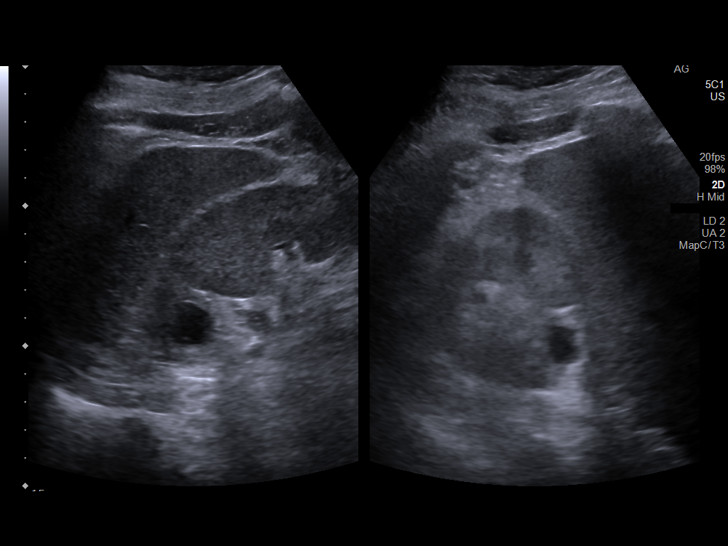
[im 25/46]
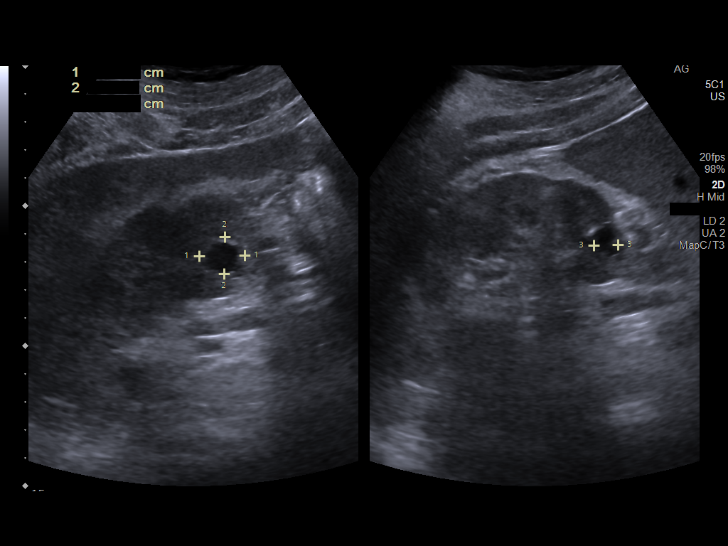
[im 29/46]
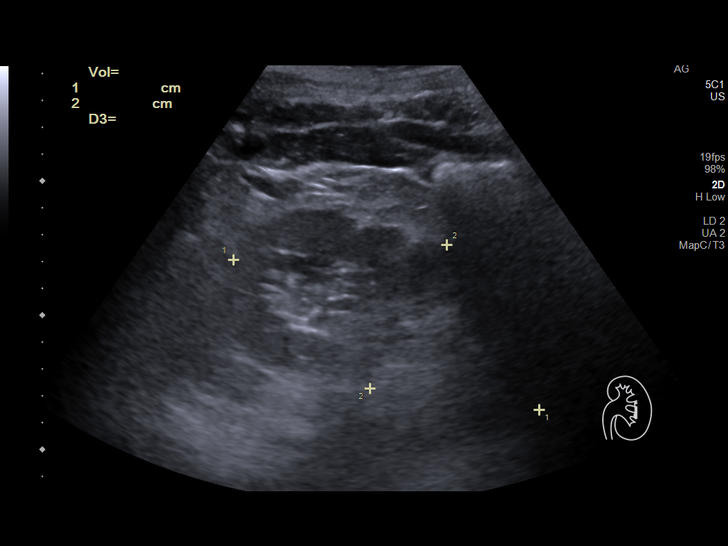
[im 31/46]
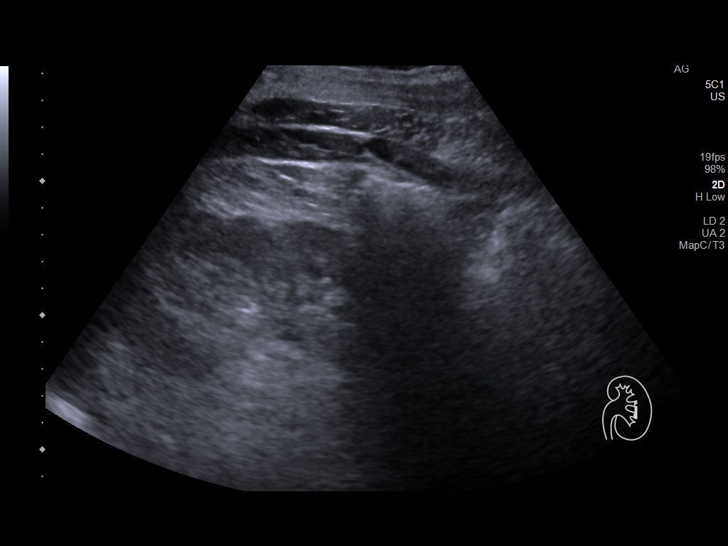
[im 34/46]
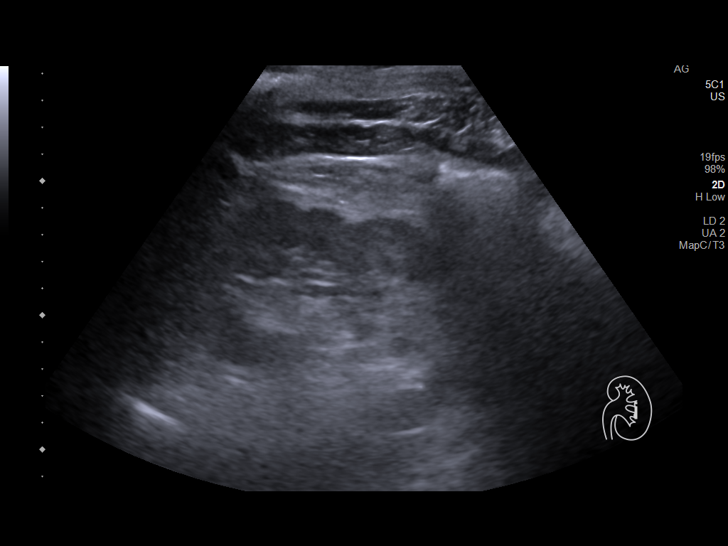
[im 38/46]
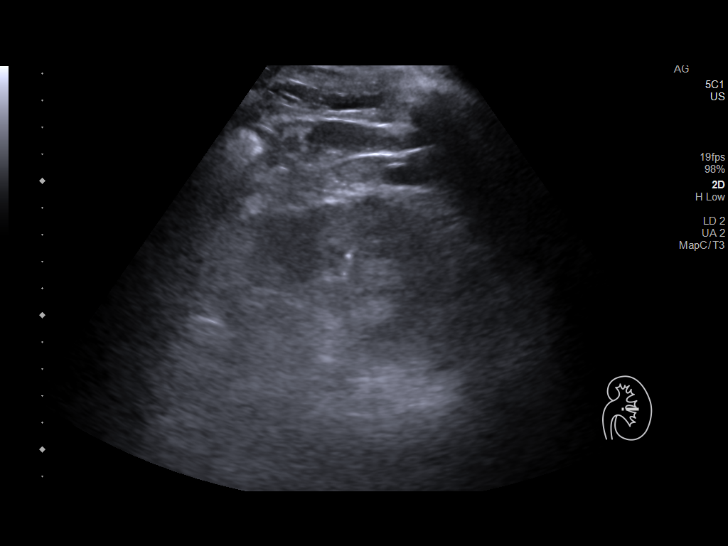
[im 42/46]
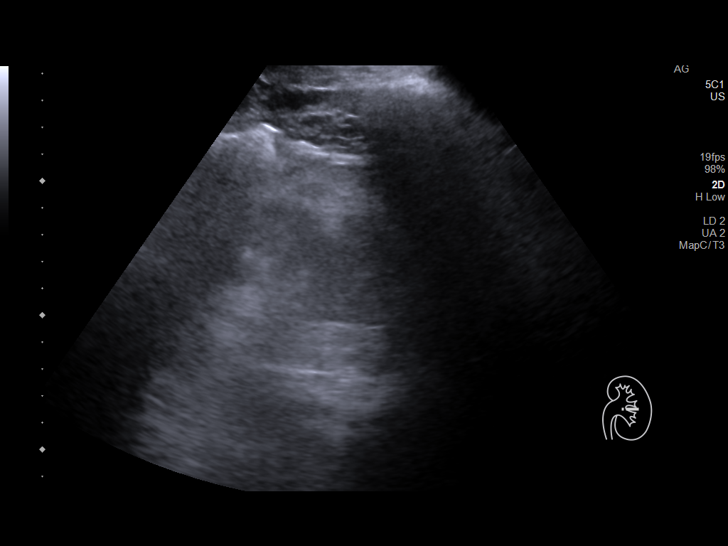
[im 46/46]
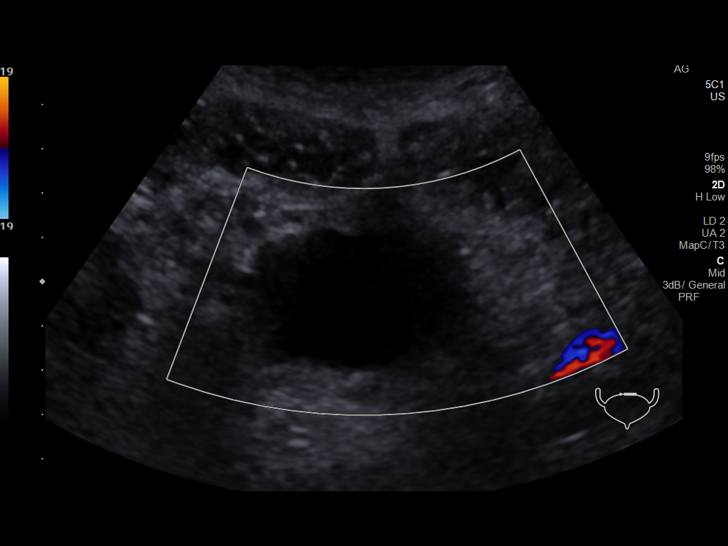

[14 of 25 positions shown; findings below may reference images not displayed]

FINDINGS: Right Kidney:

Renal measurements: 12.9 x 6.8 x 6.0 cm = volume: 272 mL .
Echogenicity within normal limits. No mass or hydronephrosis
visualized. Two renal cysts, the largest 1.8 cm in diameter.

Left Kidney:

Renal measurements: 12.7 x 6.1 x 6.1 cm = volume: 247 mL.
Echogenicity within normal limits. No mass or hydronephrosis
visualized.

Bladder:

Appears normal for degree of bladder distention.

Other:

None.
IMPRESSION: Prominent kidneys, suggesting diabetic glomerular nephropathy. No
obstruction or significant focal lesion.

## 2020-01-30 ENCOUNTER — Encounter (INDEPENDENT_AMBULATORY_CARE_PROVIDER_SITE_OTHER): Payer: Self-pay | Admitting: Nurse Practitioner

## 2020-01-30 ENCOUNTER — Ambulatory Visit (INDEPENDENT_AMBULATORY_CARE_PROVIDER_SITE_OTHER): Payer: Medicare Other | Admitting: Nurse Practitioner

## 2020-01-30 ENCOUNTER — Other Ambulatory Visit: Payer: Self-pay

## 2020-01-30 ENCOUNTER — Encounter (INDEPENDENT_AMBULATORY_CARE_PROVIDER_SITE_OTHER): Payer: Self-pay | Admitting: *Deleted

## 2020-01-30 VITALS — BP 160/90 | HR 63 | Temp 97.7°F | Ht 72.0 in | Wt 283.8 lb

## 2020-01-30 DIAGNOSIS — I82503 Chronic embolism and thrombosis of unspecified deep veins of lower extremity, bilateral: Secondary | ICD-10-CM | POA: Diagnosis not present

## 2020-01-30 DIAGNOSIS — N1831 Chronic kidney disease, stage 3a: Secondary | ICD-10-CM | POA: Diagnosis not present

## 2020-01-30 DIAGNOSIS — Z1211 Encounter for screening for malignant neoplasm of colon: Secondary | ICD-10-CM | POA: Diagnosis not present

## 2020-01-30 DIAGNOSIS — I10 Essential (primary) hypertension: Secondary | ICD-10-CM

## 2020-01-30 MED ORDER — RIVAROXABAN 20 MG PO TABS: 20.0000 mg | ORAL_TABLET | Freq: Every day | ORAL | 2 refills | Status: DC

## 2020-01-30 MED ORDER — AMLODIPINE BESYLATE 2.5 MG PO TABS: 2.5000 mg | ORAL_TABLET | Freq: Every day | ORAL | 1 refills | Status: DC

## 2020-01-30 NOTE — Patient Instructions (Signed)
Thank you for choosing [facility] as your medical provider! If you have any questions or concerns regarding your health care, please do not hesitate to call our office.  Please restart your xarelto if you are able to afford this medication, if not please let the office know and we can talk about other options. Monitor for any signs of bleeding such as blood in your stool or easy bruising. If this occurs let this office know right away. Start your new medication (amlodipine 2.5 mg by mouth daily) for blood pressure control.  Follow-up with your nephrologist as scheduled.  I will send referral to gastroenterologist for further discussion regarding recommendations for colon cancer screening.  Please follow-up as scheduled in 2 to 4 weeks. We look forward to seeing you again soon!   At [facility] we value your feedback. You may receive a survey about your visit today. Please share your experience as we strive to create trusting relationships with our patients to provide genuine, compassionate, quality care.  We appreciate your understanding and patience as we review any laboratory studies, imaging, and other diagnostic tests that are ordered as we care for you. We do our best to address any and all results in a timely manner. If you do not hear about test results within 1 week, please do not hesitate to contact us. If we referred you to a specialist during your visit or ordered imaging testing, contact the office if you have not been contacted to be scheduled within 1 weeks.  We also encourage the use of MyChart, which contains your medical information for your review as well. If you are not enrolled in this feature, an access code is on this after visit summary for your convenience. Thank you for allowing [place] to be involved in your care.

## 2020-01-30 NOTE — Progress Notes (Signed)
Subjective:  Patient ID: ISSIAH VENHAUS, male    DOB: 1961-06-23  Age: 59 y.o. MRN: [id number]  CC:  Chief Complaint  Patient presents with  . Chronic Kidney Disease  . Follow-up    History of multiple DVTs, colon cancer screening      HPI  This patient arrives today for the above.  Chronic kidney disease: He has a history of chronic kidney disease.  He is being evaluated and followed by nephrology.  He tells me he is scheduled to see them next month.  History of multiple DVTs: He also has a history of multiple DVTs.  He has been evaluated by oncology in the past, but has not followed up with them regularly.  He was recommended to be on lifelong anticoagulation, but has not been taking his medication due to financial barriers.  He was on Xarelto, and was offered Coumadin due to high cost of Xarelto but has declined this in the past.  Colon cancer screening: Of note last time we spoke he was not sure when he would be due for colonoscopy for colon cancer screening.  I had recommended that he contact his gastroenterologist to determine if he was due for this, but he has not gotten in contact with them.   Past Medical History:  Diagnosis Date  . Chronic anticoagulation 11/14/2014  . CKD (chronic kidney disease) stage 3, GFR 30-59 ml/min 10/24/2019  . CKD (chronic kidney disease) stage 3, GFR 30-59 ml/min 10/24/2019  . Clotting disorder ([place])   . 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 DVT (deep venous thrombosis) ([place]) 06/15/2011  . DVT of leg (deep venous thrombosis) (HCC)    rt. leg  . H/O PE (pulmonary embolism) 06/15/2011  . 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 Prediabetes 10/24/2019      Family History  Problem Relation Age of Onset  . COPD Father   . Stroke Brother   . Pulmonary embolism Sister   . Throat cancer Brother   . Arthritis Mother   . Diabetes Mother    . Stroke Mother   . Hypertension Brother   . Diabetes Brother   . Arthritis Brother   . Diabetes Sister   . Colon cancer Neg Hx     Social History   Social History Narrative   Lives with son   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s   Mows lawns some   Social History   Tobacco Use  . Smoking status: Current Every Day Smoker    Packs/day: 1.00    Years: 30.00    Pack years: 30.00    Types: Cigarettes    Start date: 12/01/1983  . Smokeless tobacco: Never Used  . Tobacco comment: counseled  Substance Use Topics  . Alcohol use: Yes    Alcohol/week: 3.0 standard drinks    Types: 3 Cans of beer per week    Comment: 2-3 beers a day     No outpatient medications have been marked as taking for the 01/30/20 encounter (Office Visit) with Ailene Ards, NP.    ROS:  Review of Systems  Constitutional: Negative for fever and malaise/fatigue.  Eyes: Negative for blurred vision and double vision.  Respiratory: Negative for cough, shortness of breath and wheezing.   Cardiovascular: Negative for chest pain and palpitations.  Neurological: Negative for dizziness and headaches.     Objective:  Today's Vitals: BP (!) 160/90 (BP Location: Right Arm, Patient Position: Sitting, Cuff Size: Normal)   Pulse 63   Temp 97.7 F (36.5 C) (Temporal)   Ht 6' (1.829 m)   Wt 283 lb 12.8 oz (128.7 kg)   SpO2 97%   BMI 38.49 kg/m  Vitals with BMI 01/30/2020 12/05/2019 10/24/2019  Height 6\' 0"  6\' 0"  6\' 1"   Weight 283 lbs 13 oz 285 lbs 280 lbs  BMI 38.48 [id number]  Systolic 0000000 - [id number]  Diastolic 90 - 78  Pulse 63 - 68     Physical Exam Vitals reviewed.  Constitutional:      Appearance: Normal appearance.  HENT:     Head: Normocephalic and atraumatic.  Cardiovascular:     Rate and Rhythm: Normal rate and regular rhythm.  Pulmonary:     Effort: Pulmonary effort is normal.     Breath sounds: Normal breath sounds.  Musculoskeletal:     Cervical back: Neck supple.  Skin:     General: Skin is warm and dry.  Neurological:     Mental Status: He is alert and oriented to person, place, and time.  Psychiatric:        Mood and Affect: Mood normal.        Behavior: Behavior normal.        Thought Content: Thought content normal.        Judgment: Judgment normal.          Assessment   1. Screen for colon cancer   2. 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 Hypertension, unspecified type   4. Stage 3a chronic kidney disease       Tests ordered Orders Placed This Encounter  Procedures  . Ambulatory referral to Gastroenterology     Plan: 1.  He is agreeable to being referred to gastroenterology for consultation regarding colon cancer screening.  I will send referral.  2.  I again discussed the importance of being on lifelong anticoagulation to prevent further clotting as well as life-threatening events such as heart attack, stroke, and pulmonary embolism.  He tells me he understands.  He would like me to represcribe the Xarelto and he will try to pick this up at his pharmacy.  I told him if he continues to have financial barriers we could consider going on Coumadin or I could refer him back to hematology/oncology for further evaluation consultation regarding other options for anticoagulation.  He wants to hold off on referral for right now.  I did tell him to call this office if he is unable to afford his Xarelto.  3.  Blood pressure is well above goal today.  I will initiate him on low-dose of amlodipine, and will have him follow-up in a couple weeks for blood pressure recheck.  4.  He is encouraged to follow-up with his nephrologist as scheduled.  Meds ordered this encounter  Medications  . rivaroxaban (XARELTO) 20 MG TABS tablet    Sig: Take 1 tablet (20 mg total) by mouth daily with supper.    Dispense:  30 tablet    Refill:  2    Order Specific Question:   Supervising Provider    Answer:    Hurshel Party C [id number]  . amLODipine (NORVASC) 2.5 MG tablet    Sig: Take 1 tablet (2.5 mg total) by mouth daily.    Dispense:  30 tablet    Refill:  1    Order  Specific Question:   Supervising Provider    Answer:   Doree Albee [id number]    Patient to follow-up in 2 to 4 weeks  Ailene Ards, NP

## 2020-02-03 ENCOUNTER — Encounter (HOSPITAL_COMMUNITY): Payer: Self-pay | Admitting: Emergency Medicine

## 2020-02-03 ENCOUNTER — Other Ambulatory Visit: Payer: Self-pay

## 2020-02-03 ENCOUNTER — Emergency Department (HOSPITAL_COMMUNITY)
Admission: EM | Admit: 2020-02-03 | Discharge: 2020-02-03 | Disposition: A | Discharge status: Home / Self Care | Payer: Medicare Other | Attending: Emergency Medicine | Admitting: Emergency Medicine

## 2020-02-03 DIAGNOSIS — W268XXD Contact with other sharp object(s), not elsewhere classified, subsequent encounter: Secondary | ICD-10-CM | POA: Insufficient documentation

## 2020-02-03 DIAGNOSIS — N183 Chronic kidney disease, stage 3 unspecified: Secondary | ICD-10-CM | POA: Diagnosis not present

## 2020-02-03 DIAGNOSIS — F17210 Nicotine dependence, cigarettes, uncomplicated: Secondary | ICD-10-CM | POA: Insufficient documentation

## 2020-02-03 DIAGNOSIS — N501 Vascular disorders of male genital organs: Secondary | ICD-10-CM

## 2020-02-03 DIAGNOSIS — S30813D Abrasion of scrotum and testes, subsequent encounter: Secondary | ICD-10-CM | POA: Diagnosis not present

## 2020-02-03 MED ORDER — SILVER NITRATE-POT NITRATE 75-25 % EX MISC
1.0000 | Freq: Once | CUTANEOUS | Status: DC
  Filled 2020-02-03: qty 10

## 2020-02-03 NOTE — Discharge Instructions (Signed)
Watch for further bleeding.  If you do hold pressure.  Follow-up with your doctor as needed.

## 2020-02-03 NOTE — ED Provider Notes (Signed)
[facility] Provider Note   CSN: [id number] Arrival date & time: 02/03/20  1123     History No chief complaint on file.   Matthew Wise is a 59 y.o. male.  HPI Patient presents with bleeding from his right scrotum.  States he had taken a shower and then sat down and felt something running down his leg.  States he looked down his blood.  States it was 1 area on his right scrotum where it was coming out of.  States he did scrape himself shaving the other day.  No lightheadedness or dizziness.  States he did lose a fair amount of blood at home.  Not on anticoagulation now but reportedly has been on anticoagulation for clots in the past but states he is not taking it.    Past Medical History:  Diagnosis Date  . Chronic anticoagulation 11/14/2014  . CKD (chronic kidney disease) stage 3, GFR 30-59 ml/min 10/24/2019  . CKD (chronic kidney disease) stage 3, GFR 30-59 ml/min 10/24/2019  . Clotting disorder ([place])   . 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 DVT (deep venous thrombosis) ([place]) 06/15/2011  . DVT of leg (deep venous thrombosis) (HCC)    rt. leg  . H/O PE (pulmonary embolism) 06/15/2011  . 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 Prediabetes 10/24/2019    Patient Active Problem List   Diagnosis Date Noted  . Medicare annual wellness visit, subsequent 12/05/2019  . Nicotine dependence 12/05/2019  . CKD (chronic kidney disease) stage 3, GFR 30-59 ml/min 10/24/2019  . Prediabetes 10/24/2019  . Pedal edema 09/14/2017  . Tubular adenoma of colon 09/14/2017  . Chronic anticoagulation 11/14/2014  . Sickle cell trait ([employer]) 08/29/2012  . 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 DVT (deep venous thrombosis) ([place]) 06/15/2011  . Pulmonary embolism ([place]) 06/15/2011  . 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 COLONOSCOPY WITH PROPOFOL N/A 10/29/2015   Procedure: COLONOSCOPY WITH PROPOFOL;  Surgeon: Danie Binder, MD;  Location[facility];  Service: Endoscopy;  Laterality: N/A;  930  . None to date  10/07/15       Family History  Problem Relation Age of Onset  . COPD Father   . Stroke Brother   . Pulmonary embolism Sister   . Throat cancer Brother   . Arthritis Mother   . Diabetes Mother   . Stroke Mother   . Hypertension Brother   . Diabetes Brother   . Arthritis Brother   . Diabetes Sister   . Colon cancer Neg Hx     Social History   Tobacco Use  . Smoking status: Current Every Day Smoker    Packs/day: 1.00    Years: 30.00    Pack years: 30.00    Types: Cigarettes    Start date: 12/01/1983  . Smokeless tobacco: Never Used  . Tobacco comment: counseled  Substance Use Topics  . Alcohol use: Yes    Alcohol/week: 3.0 standard drinks    Types: 3 Cans of beer per week    Comment: 2-3 beers a day  . Drug use: Not Currently    Comment: REMOTE HISTORY, Quit in 2007    Home Medications Prior to Admission medications   Medication Sig Start Date End Date Taking? Authorizing Provider  amLODipine (  NORVASC) 2.5 MG tablet Take 1 tablet (2.5 mg total) by mouth daily. 01/30/20   Ailene Ards, NP  rivaroxaban (XARELTO) 20 MG TABS tablet Take 1 tablet (20 mg total) by mouth daily with supper. 01/30/20   Ailene Ards, NP    Allergies    Patient has no known allergies.  Review of Systems   Review of Systems  Constitutional: Negative for appetite change.  Respiratory: Negative for shortness of breath.   Cardiovascular: Negative for chest pain.  Gastrointestinal: Negative for abdominal pain.  Genitourinary:       Bleeding from scrotum  Musculoskeletal: Negative for back pain.  Neurological: Negative for light-headedness.  Hematological: Does not bruise/bleed easily.    Physical Exam Updated Vital Signs BP  (!) 144/77 (BP Location: Right Arm)   Pulse 69   Temp 98.2 F (36.8 C) (Oral)   Resp 19   Ht 6' (1.829 m)   Wt 127 kg   SpO2 100%   BMI 37.97 kg/m   Physical Exam Cardiovascular:     Rate and Rhythm: Regular rhythm.  Abdominal:     Tenderness: There is no abdominal tenderness.  Genitourinary:    Comments: There is a raised area on his right hemiscrotum with blood clot on the top.  Appears to the stuff it may have been scraped previously with shaving.  No active bleeding now. Skin:    Capillary Refill: Capillary refill takes less than 2 seconds.  Neurological:     Mental Status: He is alert.     ED Results / Procedures / Treatments   Labs (all labs ordered are listed, but only abnormal results are displayed) Labs Reviewed - No data to display  EKG None  Radiology No results found.  Procedures Procedures (including critical care time)  Medications Ordered in ED Medications  silver nitrate applicators applicator 1 Stick (has no administration in time range)    ED Course  I have reviewed the triage vital signs and the nursing notes.  Pertinent labs & imaging results that were available during my care of the patient were reviewed by me and considered in my medical decision making (see chart for details).    MDM Rules/Calculators/A&P                     Patient with bleeding from scrotum.  An area.  Had been scraped with previous shaving.  Silver nitrate used to cauterize the area.  No further bleeding.  Well-appearing.  Discharge home. Final Clinical Impression(s) / ED Diagnoses Final diagnoses:  Scrotal bleeding    Rx / DC Orders ED Discharge Orders    None       Davonna Belling, MD 02/03/20 1455

## 2020-02-03 NOTE — ED Triage Notes (Signed)
Bleeding from RT testicle started x 20 min ago.  Denies any injury

## 2020-02-07 DIAGNOSIS — N1831 Chronic kidney disease, stage 3a: Secondary | ICD-10-CM | POA: Diagnosis not present

## 2020-02-07 DIAGNOSIS — R600 Localized edema: Secondary | ICD-10-CM | POA: Diagnosis not present

## 2020-02-07 DIAGNOSIS — I129 Hypertensive chronic kidney disease with stage 1 through stage 4 chronic kidney disease, or unspecified chronic kidney disease: Secondary | ICD-10-CM | POA: Diagnosis not present

## 2020-02-07 DIAGNOSIS — E559 Vitamin D deficiency, unspecified: Secondary | ICD-10-CM | POA: Diagnosis not present

## 2020-02-07 DIAGNOSIS — Z716 Tobacco abuse counseling: Secondary | ICD-10-CM | POA: Diagnosis not present

## 2020-02-07 DIAGNOSIS — E6609 Other obesity due to excess calories: Secondary | ICD-10-CM | POA: Diagnosis not present

## 2020-02-26 ENCOUNTER — Ambulatory Visit (INDEPENDENT_AMBULATORY_CARE_PROVIDER_SITE_OTHER): Payer: Medicare Other | Admitting: Nurse Practitioner

## 2020-02-28 ENCOUNTER — Ambulatory Visit (INDEPENDENT_AMBULATORY_CARE_PROVIDER_SITE_OTHER): Payer: Medicare Other | Admitting: Internal Medicine

## 2020-02-29 DIAGNOSIS — Z23 Encounter for immunization: Secondary | ICD-10-CM | POA: Diagnosis not present

## 2020-03-29 DIAGNOSIS — Z23 Encounter for immunization: Secondary | ICD-10-CM | POA: Diagnosis not present

## 2020-06-12 DIAGNOSIS — I129 Hypertensive chronic kidney disease with stage 1 through stage 4 chronic kidney disease, or unspecified chronic kidney disease: Secondary | ICD-10-CM | POA: Diagnosis not present

## 2020-06-12 DIAGNOSIS — E559 Vitamin D deficiency, unspecified: Secondary | ICD-10-CM | POA: Diagnosis not present

## 2020-06-12 DIAGNOSIS — Z79899 Other long term (current) drug therapy: Secondary | ICD-10-CM | POA: Diagnosis not present

## 2020-06-12 DIAGNOSIS — D631 Anemia in chronic kidney disease: Secondary | ICD-10-CM | POA: Diagnosis not present

## 2020-06-12 DIAGNOSIS — Z716 Tobacco abuse counseling: Secondary | ICD-10-CM | POA: Diagnosis not present

## 2020-06-12 DIAGNOSIS — N1831 Chronic kidney disease, stage 3a: Secondary | ICD-10-CM | POA: Diagnosis not present

## 2020-06-12 DIAGNOSIS — R809 Proteinuria, unspecified: Secondary | ICD-10-CM | POA: Diagnosis not present

## 2020-06-27 DIAGNOSIS — Z716 Tobacco abuse counseling: Secondary | ICD-10-CM | POA: Diagnosis not present

## 2020-06-27 DIAGNOSIS — N1831 Chronic kidney disease, stage 3a: Secondary | ICD-10-CM | POA: Diagnosis not present

## 2020-06-27 DIAGNOSIS — E559 Vitamin D deficiency, unspecified: Secondary | ICD-10-CM | POA: Diagnosis not present

## 2020-06-27 DIAGNOSIS — E6609 Other obesity due to excess calories: Secondary | ICD-10-CM | POA: Diagnosis not present

## 2020-06-27 DIAGNOSIS — I129 Hypertensive chronic kidney disease with stage 1 through stage 4 chronic kidney disease, or unspecified chronic kidney disease: Secondary | ICD-10-CM | POA: Diagnosis not present

## 2020-06-27 DIAGNOSIS — T1490XA Injury, unspecified, initial encounter: Secondary | ICD-10-CM | POA: Diagnosis not present

## 2020-07-09 ENCOUNTER — Encounter (HOSPITAL_COMMUNITY): Payer: Self-pay | Admitting: Physical Therapy

## 2020-07-09 ENCOUNTER — Other Ambulatory Visit: Payer: Self-pay

## 2020-07-09 ENCOUNTER — Ambulatory Visit (HOSPITAL_COMMUNITY): Payer: Medicare Other | Attending: Nephrology | Admitting: Physical Therapy

## 2020-07-09 DIAGNOSIS — M79661 Pain in right lower leg: Secondary | ICD-10-CM | POA: Diagnosis not present

## 2020-07-09 DIAGNOSIS — R262 Difficulty in walking, not elsewhere classified: Secondary | ICD-10-CM

## 2020-07-09 DIAGNOSIS — S81801A Unspecified open wound, right lower leg, initial encounter: Secondary | ICD-10-CM | POA: Diagnosis not present

## 2020-07-09 NOTE — Therapy (Signed)
[name], [place], [address] Phone: [phone]   Fax:  [phone]  Wound Care Therapy  Patient Details  Name: Matthew Wise MRN: [id number] Date of Birth: 1961/09/09 Referring Provider (PT): Ulice Bold MD   Encounter Date: 07/09/2020   PT End of Session - 07/09/20 1001    Visit Number 1    Number of Visits 12    Date for PT Re-Evaluation 08/20/20    Authorization Type medicare    Progress Note Due on Visit 10    PT Start Time 0915    PT Stop Time 0955    PT Time Calculation (min) 40 min    Activity Tolerance Patient tolerated treatment well    Behavior During Therapy [facility] for tasks assessed/performed           Past Medical History:  Diagnosis Date  . Chronic anticoagulation 11/14/2014  . CKD (chronic kidney disease) stage 3, GFR 30-59 ml/min 10/24/2019  . CKD (chronic kidney disease) stage 3, GFR 30-59 ml/min 10/24/2019  . Clotting disorder ([place])   . 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 DVT (deep venous thrombosis) ([place]) 06/15/2011  . DVT of leg (deep venous thrombosis) (HCC)    rt. leg  . H/O PE (pulmonary embolism) 06/15/2011  . 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 Prediabetes 10/24/2019    Past Surgical History:  Procedure Laterality Date  . COLONOSCOPY WITH PROPOFOL N/A 10/29/2015   Procedure: COLONOSCOPY WITH PROPOFOL;  Surgeon: Danie Binder, MD;  Location[facility];  Service: Endoscopy;  Laterality: N/A;  930  . None to date  10/07/15    There were no vitals filed for this visit.      [facility] PT Assessment - 07/09/20 0001      Assessment   Medical Diagnosis R lower leg wound    Referring Provider (PT) Ulice Bold MD    Prior Therapy yes for wounds in 2017      Balance Screen   Has the patient fallen in the past 6 months No                   Wound  Therapy - 07/09/20 0001    Subjective Patient reports wound started about 3 weeks ago. States that he had a wound there previously that was treated at this clinic (in 2017). States that he has been using the honey but that didn't seem to help. Reports it hurts sometimes as much as 8/10 but reports no current pain. States he has compression garments and wears them all the time (not currently wearing them) and that he got them when he was last here. Reports he just wants his wound to heal.     Patient and Family Stated Goals to have wound healed    Date of Onset 06/18/20   approximate   Prior Treatments honey gel     Pain Scale 0-10    Pain Score 8    no current pain but at it's worse   Pain Type Acute pain    Pain Location Leg    Pain Orientation Right;Posterior;Lower    Pain Descriptors / Indicators Aching    Pain Onset Gradual    Patients Stated Pain Goal 0    Multiple Pain Sites No    Evaluation and Treatment Procedures Explained to Patient/Family Yes  Evaluation and Treatment Procedures agreed to    Wound Properties Date First Assessed: 07/09/20 Time First Assessed: 0935 Wound Type: Venous stasis ulcer Location: Leg Location Orientation: Right;Posterior;Lower Wound Description (Comments): posterior leg wound Present on Admission: Yes   Dressing Changed New    Dressing Status Dry;New drainage    Dressing Change Frequency PRN    % Wound base Red or Granulating 0%    % Wound base Black/Eschar 15%    % Wound base Other/Granulation Tissue (Comment) 85%    Peri-wound Assessment Intact;Edema   dry dead skin    Wound Length (cm) 3 cm   peripheral margins 9.2   Wound Width (cm) 4 cm   peripheral margins 13.4   Wound Depth (cm) --   unknown    Wound Surface Area (cm^2) 12 cm^2    Margins Attached edges (approximated)    Drainage Amount Scant    Drainage Description Purulent    Treatment Cleansed;Debridement (Selective)    Wound Therapy - Clinical Statement Patient presents to therapy with  wound along right lower posterior leg. Wound is currently covered in dry necrotic tissue but has small opening that is draining dark purulent drainage when surrounding area compressed. Wound margins potentially extend further then current measured wound area secondary to darker discoloration that is likely indicative of deeper wound injury. Educated patient on off loading posterior leg and in wound care POC.  Patient presents with comorbidities that is likely contributing to delayed healing (varicose veins, peripheral vascular disease and tobacco user). Patient would benefit from skilled physical therapy to provide optimal environment for wound healing.     Wound Therapy - Functional Problem List difficulty walking , bathing     Factors Delaying/Impairing Wound Healing Vascular compromise;Tobacco use    Wound Therapy - Frequency 2X / week    Wound Therapy - Current Recommendations PT    Wound Plan continue with debridement and dressing changes as tolerated    Dressing  hydrogel colloidal sheet with medihoney, profore , netting #5                   PT Education - 07/09/20 1005    Education Details on doffing compression garments- how and when to if need to. on obtaining compression garments for use on left LE - measurements taken    Person(s) Educated Patient    Methods Explanation    Comprehension Verbalized understanding            PT Short Term Goals - 07/09/20 1005      PT SHORT TERM GOAL #1   Title Patient to present with 0% slough wound and 100% red granulation tissue to demonstrate overall improvement of condition    Time 3    Period Weeks    Status New    Target Date 07/30/20      PT SHORT TERM GOAL #2   Title Patient will report max pain at 5/10 over last week to demonstrate improved QOL.    Time 3    Period Weeks    Status New    Target Date 07/30/20      PT SHORT TERM GOAL #3   Title Patient will don compression garments on left LE consistently over course of  week to decrease risk of wound developement on left LE.    Time 3    Period Weeks    Status New    Target Date 07/30/20  PT Long Term Goals - 07/09/20 1100      PT LONG TERM GOAL #1   Title Patient to demonstrate full wound healing in wound  on right lower extremity    Time 6    Period Weeks    Status New    Target Date 08/20/20      PT LONG TERM GOAL #2   Title Patient will report no pain in wound bed to demonstrate improved QOL.    Time 6    Period Weeks    Status New    Target Date 08/20/20      PT LONG TERM GOAL #3   Title Patient will be able to verbalize importance of continued compliance for compression therapy in regards to reducing risk of of future wounds.    Time 6    Period Weeks    Status New    Target Date 08/20/20                 Plan - 07/09/20 1002    Personal Factors and Comorbidities Comorbidity 1;Comorbidity 2;Comorbidity 3+    Comorbidities stage 3 chronic kidney disease, smoker, venous insufficinency, varicose veins    Examination-Activity Limitations Hygiene/Grooming;Locomotion Level    Stability/Clinical Decision Making Stable/Uncomplicated    Clinical Decision Making Low    Rehab Potential Fair    PT Frequency 2x / week    PT Duration 6 weeks    PT Treatment/Interventions ADLs/Self Care Home Management;Compression bandaging;Scar mobilization;Passive range of motion;Manual lymph drainage;Manual techniques;Therapeutic exercise;Cryotherapy;Therapeutic activities    PT Next Visit Plan f/u with compression garments - need to wear on left LE    Consulted and Agree with Plan of Care Patient           Patient will benefit from skilled therapeutic intervention in order to improve the following deficits and impairments:  Pain, Difficulty walking, Decreased mobility, Decreased scar mobility, Decreased skin integrity, Increased edema  Visit Diagnosis: Wound of right lower extremity, initial encounter - Plan: PT plan of care  cert/re-cert  Difficulty in walking, not elsewhere classified - Plan: PT plan of care cert/re-cert  Pain in right lower leg - Plan: PT plan of care cert/re-cert     Problem List Patient Active Problem List   Diagnosis Date Noted  . Medicare annual wellness visit, subsequent 12/05/2019  . Nicotine dependence 12/05/2019  . CKD (chronic kidney disease) stage 3, GFR 30-59 ml/min 10/24/2019  . Prediabetes 10/24/2019  . Pedal edema 09/14/2017  . Tubular adenoma of colon 09/14/2017  . Chronic anticoagulation 11/14/2014  . Sickle cell trait ([place]) 08/29/2012  . 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 DVT (deep venous thrombosis) ([place]) 06/15/2011  . Pulmonary embolism ([place]) 06/15/2011  . 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

## 2020-07-12 ENCOUNTER — Ambulatory Visit (HOSPITAL_COMMUNITY): Payer: Medicare Other

## 2020-07-12 ENCOUNTER — Encounter (HOSPITAL_COMMUNITY): Payer: Self-pay

## 2020-07-12 ENCOUNTER — Other Ambulatory Visit: Payer: Self-pay

## 2020-07-12 DIAGNOSIS — M79661 Pain in right lower leg: Secondary | ICD-10-CM | POA: Diagnosis not present

## 2020-07-12 DIAGNOSIS — R262 Difficulty in walking, not elsewhere classified: Secondary | ICD-10-CM | POA: Diagnosis not present

## 2020-07-12 DIAGNOSIS — S81801A Unspecified open wound, right lower leg, initial encounter: Secondary | ICD-10-CM

## 2020-07-12 NOTE — Therapy (Signed)
[facility] [place], [place], [address] Phone: [phone]   Fax:  [phone]  Wound Care Therapy  Patient Details  Name: Matthew Wise MRN: [id number] Date of Birth: 06/18/1961 Referring Provider (PT): Ulice Bold MD   Encounter Date: 07/12/2020   PT End of Session - 07/12/20 1115    Visit Number 2    Number of Visits 12    Date for PT Re-Evaluation 08/20/20    Authorization Type medicare    Progress Note Due on Visit 10    PT Start Time 1008    PT Stop Time 1050    PT Time Calculation (min) 42 min    Activity Tolerance Patient tolerated treatment well    Behavior During Therapy [facility] for tasks assessed/performed           Past Medical History:  Diagnosis Date  . Chronic anticoagulation 11/14/2014  . CKD (chronic kidney disease) stage 3, GFR 30-59 ml/min 10/24/2019  . CKD (chronic kidney disease) stage 3, GFR 30-59 ml/min 10/24/2019  . Clotting disorder ([place])   . 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 DVT (deep venous thrombosis) ([place]) 06/15/2011  . DVT of leg (deep venous thrombosis) ([facility])    rt. leg  . H/O PE (pulmonary embolism) 06/15/2011  . 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 Prediabetes 10/24/2019    Past Surgical History:  Procedure Laterality Date  . COLONOSCOPY WITH PROPOFOL N/A 10/29/2015   Procedure: COLONOSCOPY WITH PROPOFOL;  Surgeon: Danie Binder, MD;  [facility];  Service: Endoscopy;  Laterality: N/A;  930  . None to date  10/07/15    There were no vitals filed for this visit.    Subjective Assessment - 07/12/20 1059    Subjective Pt arrived with compression garments on BLE, stated profore slid down so he removed and replaced with compression garments.    Currently in Pain? Yes    Pain Score 2     Pain Location Leg    Pain Orientation Right;Posterior;Lower    Pain  Descriptors / Indicators Aching;Sore                     Wound Therapy - 07/12/20 1059    Subjective Pt arrived with compression garments on BLE, stated profore slid down so he removed and replaced with compression garments.    Patient and Family Stated Goals to have wound healed    Date of Onset 06/18/20   approximate   Prior Treatments honey gel     Pain Scale 0-10    Pain Onset Gradual    Patients Stated Pain Goal 0    Multiple Pain Sites No    Evaluation and Treatment Procedures Explained to Patient/Family Yes    Evaluation and Treatment Procedures agreed to    Wound Properties Date First Assessed: 07/09/20 Time First Assessed: 0935 Wound Type: Venous stasis ulcer Location: Leg Location Orientation: Right;Posterior;Lower Wound Description (Comments): posterior leg wound Present on Admission: Yes   Dressing Type --   medihoney collaide, 4x4, cotton and profore   Dressing Changed Changed    Dressing Status Old drainage;Dry    Dressing Change Frequency PRN    % Wound base Red or Granulating 5%    % Wound base Wise/Eschar 15%    % Wound base Other/Granulation Tissue (Comment) 80%    Peri-wound Assessment  Intact;Edema   dry dead skin   Margins Attached edges (approximated)    Drainage Amount Scant    Drainage Description Purulent;Green;No odor    Treatment Cleansed;Debridement (Selective)    Selective Debridement - Location posterior Rt LE     Selective Debridement - Tools Used Forceps;Scalpel;Scissors   qtips   Selective Debridement - Tissue Removed dry dead skin perimeter and slough from wound bed    Wound Therapy - Clinical Statement LE presents with very dead skin perimeter of wound and thick slough that was easily removed with qtips.  LE cleansed and selective debridement for removal of the dead skin perimeter of wound to promote healing.  Applied vaseline perimeter or wound and lotion applied to distal extremity.  Added cotton for cone shape to reduce profore from  sliding down.  Reports of comfort at EOS.     Wound Therapy - Functional Problem List difficulty walking , bathing     Factors Delaying/Impairing Wound Healing Vascular compromise;Tobacco use    Wound Therapy - Frequency 2X / week    Wound Therapy - Current Recommendations PT    Wound Plan continue with debridement and dressing changes as tolerated    Dressing  hydrogel colloidal sheet with medihoney, profore , netting #5                     PT Short Term Goals - 07/09/20 1005      PT SHORT TERM GOAL #1   Title Patient to present with 0% slough wound and 100% red granulation tissue to demonstrate overall improvement of condition    Time 3    Period Weeks    Status New    Target Date 07/30/20      PT SHORT TERM GOAL #2   Title Patient will report max pain at 5/10 over last week to demonstrate improved QOL.    Time 3    Period Weeks    Status New    Target Date 07/30/20      PT SHORT TERM GOAL #3   Title Patient will don compression garments on left LE consistently over course of week to decrease risk of wound developement on left LE.    Time 3    Period Weeks    Status New    Target Date 07/30/20             PT Long Term Goals - 07/09/20 1100      PT LONG TERM GOAL #1   Title Patient to demonstrate full wound healing in wound  on right lower extremity    Time 6    Period Weeks    Status New    Target Date 08/20/20      PT LONG TERM GOAL #2   Title Patient will report no pain in wound bed to demonstrate improved QOL.    Time 6    Period Weeks    Status New    Target Date 08/20/20      PT LONG TERM GOAL #3   Title Patient will be able to verbalize importance of continued compliance for compression therapy in regards to reducing risk of of future wounds.    Time 6    Period Weeks    Status New    Target Date 08/20/20                  Patient will benefit from skilled therapeutic intervention in order to improve the following deficits and  impairments:     Visit Diagnosis: Pain in right lower leg  Wound of right lower extremity, initial encounter  Difficulty in walking, not elsewhere classified     Problem List Patient Active Problem List   Diagnosis Date Noted  . Medicare annual wellness visit, subsequent 12/05/2019  . Nicotine dependence 12/05/2019  . CKD (chronic kidney disease) stage 3, GFR 30-59 ml/min 10/24/2019  . Prediabetes 10/24/2019  . Pedal edema 09/14/2017  . Tubular adenoma of colon 09/14/2017  . Chronic anticoagulation 11/14/2014  . Sickle cell trait ([place]) 08/29/2012  . 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 DVT (deep venous thrombosis) ([place]) 06/15/2011  . Pulmonary embolism ([place]) 06/15/2011  . 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   Ihor Austin, LPTA/CLT; CBIS [phone]  [name] 07/12/2020, 11:16 AM  [facility] [address] [place], [place], [address] Phone: [phone]   Fax:  [phone]  Name: Matthew Wise MRN: [id number] Date of Birth: 08/15/1961

## 2020-07-15 ENCOUNTER — Ambulatory Visit (HOSPITAL_COMMUNITY): Payer: Medicare Other | Admitting: Physical Therapy

## 2020-07-15 ENCOUNTER — Other Ambulatory Visit: Payer: Self-pay

## 2020-07-15 ENCOUNTER — Encounter (HOSPITAL_COMMUNITY): Payer: Self-pay | Admitting: Physical Therapy

## 2020-07-15 DIAGNOSIS — M79661 Pain in right lower leg: Secondary | ICD-10-CM | POA: Diagnosis not present

## 2020-07-15 DIAGNOSIS — S81801A Unspecified open wound, right lower leg, initial encounter: Secondary | ICD-10-CM | POA: Diagnosis not present

## 2020-07-15 DIAGNOSIS — R262 Difficulty in walking, not elsewhere classified: Secondary | ICD-10-CM | POA: Diagnosis not present

## 2020-07-15 NOTE — Therapy (Signed)
[facility] [place], [place], [address] Phone: [phone]   Fax:  [phone]  Wound Care Therapy  Patient Details  Name: Matthew Wise MRN: [id number] Date of Birth: February 15, 1961 Referring Provider (PT): Ulice Bold MD   Encounter Date: 07/15/2020   PT End of Session - 07/15/20 1442    Visit Number 3    Number of Visits 12    Date for PT Re-Evaluation 08/20/20    Authorization Type medicare    Progress Note Due on Visit 10    PT Start Time 1400    PT Stop Time 1430    PT Time Calculation (min) 30 min    Activity Tolerance Patient tolerated treatment well    Behavior During Therapy [facility] for tasks assessed/performed           Past Medical History:  Diagnosis Date  . Chronic anticoagulation 11/14/2014  . CKD (chronic kidney disease) stage 3, GFR 30-59 ml/min 10/24/2019  . CKD (chronic kidney disease) stage 3, GFR 30-59 ml/min 10/24/2019  . Clotting disorder ([place])   . 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 DVT (deep venous thrombosis) ([place]) 06/15/2011  . DVT of leg (deep venous thrombosis) ([facility])    rt. leg  . H/O PE (pulmonary embolism) 06/15/2011  . 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 Prediabetes 10/24/2019    Past Surgical History:  Procedure Laterality Date  . COLONOSCOPY WITH PROPOFOL N/A 10/29/2015   Procedure: COLONOSCOPY WITH PROPOFOL;  Surgeon: Danie Binder, MD;  [facility];  Service: Endoscopy;  Laterality: N/A;  930  . None to date  10/07/15    There were no vitals filed for this visit.               Wound Therapy - 07/15/20 0001    Subjective PT has compression garments on states that the bandage slipped down     Patient and Family Stated Goals to have wound healed    Date of Onset 06/18/20   approximate   Prior Treatments honey gel     Pain Scale 0-10    Pain  Score 0-No pain    Evaluation and Treatment Procedures Explained to Patient/Family Yes    Evaluation and Treatment Procedures agreed to    Wound Properties Date First Assessed: 07/09/20 Time First Assessed: 0935 Wound Type: Venous stasis ulcer Location: Leg Location Orientation: Right;Posterior;Lower Wound Description (Comments): posterior leg wound Present on Admission: Yes   Dressing Type Gauze (Comment)    Dressing Changed Changed    Dressing Status Old drainage    Dressing Change Frequency PRN    % Wound base Red or Granulating 50%    % Wound base Yellow/Fibrinous Exudate 50%    % Wound base Black/Eschar 0%    Drainage Amount Scant    Drainage Description Serous    Treatment Cleansed;Debridement (Selective)   compression bandaging and manual    Selective Debridement - Location wound     Selective Debridement - Tools Used Forceps;Scissors    Selective Debridement - Tissue Removed slough    Wound Therapy - Clinical Statement PT verbalized that dressing was still sliding down therefore foam was cut for LE to assist in keeping bandaging in place.  Manual completed following debridement to promote reduction in edema.      Wound Therapy - Functional Problem List difficulty walking ,  bathing     Factors Delaying/Impairing Wound Healing Vascular compromise;Tobacco use    Wound Therapy - Frequency 2X / week    Wound Therapy - Current Recommendations PT    Wound Plan continue with debridement, manual and dressing changes as tolerated    Dressing  hydrogel colloid honey, profore compression bandage with foam. f/b netting                      PT Short Term Goals - 07/15/20 1443      PT SHORT TERM GOAL #1   Title Patient to present with 0% slough wound and 100% red granulation tissue to demonstrate overall improvement of condition    Time 3    Period Weeks    Status On-going    Target Date 07/30/20      PT SHORT TERM GOAL #2   Title Patient will report max pain at 5/10 over  last week to demonstrate improved QOL.    Time 3    Period Weeks    Status Partially Met    Target Date 07/30/20      PT SHORT TERM GOAL #3   Title Patient will don compression garments on left LE consistently over course of week to decrease risk of wound developement on left LE.    Time 3    Period Weeks    Status On-going    Target Date 07/30/20             PT Long Term Goals - 07/15/20 1443      PT LONG TERM GOAL #1   Title Patient to demonstrate full wound healing in wound  on right lower extremity    Time 6    Period Weeks    Status On-going      PT LONG TERM GOAL #2   Title Patient will report no pain in wound bed to demonstrate improved QOL.    Time 6    Period Weeks    Status On-going      PT LONG TERM GOAL #3   Title Patient will be able to verbalize importance of continued compliance for compression therapy in regards to reducing risk of of future wounds.    Time 6    Period Weeks    Status On-going                 Plan - 07/15/20 1442    Clinical Impression Statement see above    Personal Factors and Comorbidities Comorbidity 1;Comorbidity 2;Comorbidity 3+    Comorbidities stage 3 chronic kidney disease, smoker, venous insufficinency, varicose veins    Examination-Activity Limitations Hygiene/Grooming;Locomotion Level    Stability/Clinical Decision Making Stable/Uncomplicated    Rehab Potential Fair    PT Frequency 2x / week    PT Duration 6 weeks    PT Treatment/Interventions ADLs/Self Care Home Management;Compression bandaging;Scar mobilization;Passive range of motion;Manual lymph drainage;Manual techniques;Therapeutic exercise;Cryotherapy;Therapeutic activities    PT Next Visit Plan f/u with compression garments - need to wear on left LE    Consulted and Agree with Plan of Care Patient           Patient will benefit from skilled therapeutic intervention in order to improve the following deficits and impairments:  Pain, Difficulty walking,  Decreased mobility, Decreased scar mobility, Decreased skin integrity, Increased edema  Visit Diagnosis: Pain in right lower leg  Wound of right lower extremity, initial encounter  Difficulty in walking, not elsewhere classified  Problem List Patient Active Problem List   Diagnosis Date Noted  . Medicare annual wellness visit, subsequent 12/05/2019  . Nicotine dependence 12/05/2019  . CKD (chronic kidney disease) stage 3, GFR 30-59 ml/min 10/24/2019  . Prediabetes 10/24/2019  . Pedal edema 09/14/2017  . Tubular adenoma of colon 09/14/2017  . Chronic anticoagulation 11/14/2014  . Sickle cell trait ([place]) 08/29/2012  . 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 DVT (deep venous thrombosis) ([place]) 06/15/2011  . Pulmonary embolism ([place]) 06/15/2011  . 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    [name], PT CLT [phone] 07/15/2020, 2:44 PM  [facility] [address] [place], [place], [address] Phone: [phone]   Fax:  [phone]  Name: Matthew Wise MRN: [id number] Date of Birth: January 31, 1961

## 2020-07-17 ENCOUNTER — Ambulatory Visit (HOSPITAL_COMMUNITY): Payer: Medicare Other | Admitting: Physical Therapy

## 2020-07-17 ENCOUNTER — Encounter (HOSPITAL_COMMUNITY): Payer: Self-pay | Admitting: Physical Therapy

## 2020-07-17 ENCOUNTER — Other Ambulatory Visit: Payer: Self-pay

## 2020-07-17 DIAGNOSIS — S81801A Unspecified open wound, right lower leg, initial encounter: Secondary | ICD-10-CM

## 2020-07-17 DIAGNOSIS — M79661 Pain in right lower leg: Secondary | ICD-10-CM

## 2020-07-17 DIAGNOSIS — R262 Difficulty in walking, not elsewhere classified: Secondary | ICD-10-CM

## 2020-07-17 NOTE — Therapy (Signed)
[facility] [place], [place], [address] Phone: [phone]   Fax:  [phone]  Wound Care Therapy  Patient Details  Name: Matthew Wise MRN: [id number] Date of Birth: 30-Oct-1961 Referring Provider (PT): Ulice Bold MD   Encounter Date: 07/17/2020   PT End of Session - 07/17/20 1520    Visit Number 4    Number of Visits 12    Date for PT Re-Evaluation 08/20/20    Authorization Type medicare    Progress Note Due on Visit 10    PT Start Time 1128    PT Stop Time 1215    PT Time Calculation (min) 47 min    Activity Tolerance Patient tolerated treatment well    Behavior During Therapy [facility] for tasks assessed/performed           Past Medical History:  Diagnosis Date  . Chronic anticoagulation 11/14/2014  . CKD (chronic kidney disease) stage 3, GFR 30-59 ml/min 10/24/2019  . CKD (chronic kidney disease) stage 3, GFR 30-59 ml/min 10/24/2019  . Clotting disorder ([place])   . 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 DVT (deep venous thrombosis) ([place]) 06/15/2011  . DVT of leg (deep venous thrombosis) ([facility])    rt. leg  . H/O PE (pulmonary embolism) 06/15/2011  . 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 Prediabetes 10/24/2019    Past Surgical History:  Procedure Laterality Date  . COLONOSCOPY WITH PROPOFOL N/A 10/29/2015   Procedure: COLONOSCOPY WITH PROPOFOL;  Surgeon: Danie Binder, MD;  [facility];  Service: Endoscopy;  Laterality: N/A;  930  . None to date  10/07/15    There were no vitals filed for this visit.    Subjective Assessment - 07/17/20 1424    Subjective Pt returns with profore intact today.  Reports no issues or slippage.    Currently in Pain? No/denies                     Wound Therapy - 07/17/20 0001    Subjective compression garment on Lt LE and profore on Rt today.     Pain Scale 0-10    Pain Score 0-No pain    Wound Properties Date First Assessed: 07/09/20 Time First Assessed: 0935 Wound Type: Venous stasis ulcer Location: Leg Location Orientation: Right;Posterior;Lower Wound Description (Comments): posterior leg wound Present on Admission: Yes   Dressing Type Compression wrap;Impregnated gauze (bismuth)    Dressing Changed Changed    Dressing Status Old drainage    Dressing Change Frequency PRN    Site / Wound Assessment Pink;Pale;Brown;Dry;Bleeding    % Wound base Red or Granulating 60%    % Wound base Yellow/Fibrinous Exudate 30%    % Wound base Other/Granulation Tissue (Comment) 10%    Peri-wound Assessment Bleeding;Other (Comment)   dry   Margins Attached edges (approximated)    Drainage Amount Minimal    Drainage Description Serosanguineous    Treatment Cleansed;Debridement (Selective)    Selective Debridement - Location wounds posterior LE    Selective Debridement - Tools Used Forceps    Selective Debridement - Tissue Removed slough, dry tissue, fibrotic/devitalized tissue    Wound Therapy - Clinical Statement Profore remained intact and did not slide down with addition of foam last session.  Minimal drainage in places and dry in others.  Cleasned well and debrided away necrotic tissue, devitalized  tissue to promote approximation and improved skin integrity.  Dressed deeper areas with medihoney colloid and applied xerform to remaining areas to help reduce dryness and further breakdown.  Moisturized LE well and continued with      Wound Therapy - Functional Problem List difficulty walking , bathing     Factors Delaying/Impairing Wound Healing Vascular compromise;Tobacco use    Wound Therapy - Frequency 2X / week    Wound Therapy - Current Recommendations PT    Wound Plan continue with debridement, manual and dressing changes as tolerated    Dressing  honeycolloid to deeper wounds and xeroform to surrounding irritated tissue, profore                      PT Short Term Goals - 07/15/20 1443      PT SHORT TERM GOAL #1   Title Patient to present with 0% slough wound and 100% red granulation tissue to demonstrate overall improvement of condition    Time 3    Period Weeks    Status On-going    Target Date 07/30/20      PT SHORT TERM GOAL #2   Title Patient will report max pain at 5/10 over last week to demonstrate improved QOL.    Time 3    Period Weeks    Status Partially Met    Target Date 07/30/20      PT SHORT TERM GOAL #3   Title Patient will don compression garments on left LE consistently over course of week to decrease risk of wound developement on left LE.    Time 3    Period Weeks    Status On-going    Target Date 07/30/20             PT Long Term Goals - 07/15/20 1443      PT LONG TERM GOAL #1   Title Patient to demonstrate full wound healing in wound  on right lower extremity    Time 6    Period Weeks    Status On-going      PT LONG TERM GOAL #2   Title Patient will report no pain in wound bed to demonstrate improved QOL.    Time 6    Period Weeks    Status On-going      PT LONG TERM GOAL #3   Title Patient will be able to verbalize importance of continued compliance for compression therapy in regards to reducing risk of of future wounds.    Time 6    Period Weeks    Status On-going                 Plan - 07/17/20 1520    Personal Factors and Comorbidities Comorbidity 1;Comorbidity 2;Comorbidity 3+    Comorbidities stage 3 chronic kidney disease, smoker, venous insufficinency, varicose veins    Examination-Activity Limitations Hygiene/Grooming;Locomotion Level    Stability/Clinical Decision Making Stable/Uncomplicated    Rehab Potential Fair    PT Frequency 2x / week    PT Duration 6 weeks    PT Treatment/Interventions ADLs/Self Care Home Management;Compression bandaging;Scar mobilization;Passive range of motion;Manual lymph drainage;Manual techniques;Therapeutic  exercise;Cryotherapy;Therapeutic activities    PT Next Visit Plan continue woundcare and use of profore until wounds heal then transition back to compression garments.  f/u on when new compression garments will arrive from [facility].    Consulted and Agree with Plan of Care Patient           Patient  will benefit from skilled therapeutic intervention in order to improve the following deficits and impairments:  Pain, Difficulty walking, Decreased mobility, Decreased scar mobility, Decreased skin integrity, Increased edema  Visit Diagnosis: Pain in right lower leg  Wound of right lower extremity, initial encounter  Difficulty in walking, not elsewhere classified     Problem List Patient Active Problem List   Diagnosis Date Noted  . Medicare annual wellness visit, subsequent 12/05/2019  . Nicotine dependence 12/05/2019  . CKD (chronic kidney disease) stage 3, GFR 30-59 ml/min 10/24/2019  . Prediabetes 10/24/2019  . Pedal edema 09/14/2017  . Tubular adenoma of colon 09/14/2017  . Chronic anticoagulation 11/14/2014  . Sickle cell trait ([place]) 08/29/2012  . 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 DVT (deep venous thrombosis) ([place]) 06/15/2011  . Pulmonary embolism ([place]) 06/15/2011  . 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

## 2020-07-23 ENCOUNTER — Ambulatory Visit (HOSPITAL_COMMUNITY): Payer: Medicare Other

## 2020-07-23 ENCOUNTER — Other Ambulatory Visit: Payer: Self-pay

## 2020-07-23 ENCOUNTER — Encounter (HOSPITAL_COMMUNITY): Payer: Self-pay

## 2020-07-23 DIAGNOSIS — S81801A Unspecified open wound, right lower leg, initial encounter: Secondary | ICD-10-CM

## 2020-07-23 DIAGNOSIS — M79661 Pain in right lower leg: Secondary | ICD-10-CM

## 2020-07-23 DIAGNOSIS — R262 Difficulty in walking, not elsewhere classified: Secondary | ICD-10-CM

## 2020-07-23 NOTE — Therapy (Signed)
[place], [place], [address] Phone: [phone]   Fax:  [phone]  Physical Therapy Treatment  Patient Details  Name: Matthew Wise MRN: [id number] Date of Birth: [birth year] Referring Provider (PT): Ulice Bold MD   Encounter Date: 07/23/2020   PT End of Session - 07/23/20 1609    Visit Number 5    Number of Visits 12    Date for PT Re-Evaluation 08/20/20    Authorization Type medicare    Progress Note Due on Visit 10    PT Start Time 2244    PT Stop Time 1605    PT Time Calculation (min) 30 min    Activity Tolerance Patient tolerated treatment well    Behavior During Therapy [facility] for tasks assessed/performed           Past Medical History:  Diagnosis Date  . Chronic anticoagulation 11/14/2014  . CKD (chronic kidney disease) stage 3, GFR 30-59 ml/min 10/24/2019  . CKD (chronic kidney disease) stage 3, GFR 30-59 ml/min 10/24/2019  . Clotting disorder ([place])   . 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 DVT (deep venous thrombosis) ([place]) 06/15/2011  . DVT of leg (deep venous thrombosis) ([facility])    rt. leg  . H/O PE (pulmonary embolism) 06/15/2011  . 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 Prediabetes 10/24/2019    Past Surgical History:  Procedure Laterality Date  . COLONOSCOPY WITH PROPOFOL N/A 10/29/2015   Procedure: COLONOSCOPY WITH PROPOFOL;  Surgeon: Danie Binder, MD;  [facility];  Service: Endoscopy;  Laterality: N/A;  930  . None to date  10/07/15    There were no vitals filed for this visit.   Subjective Assessment - 07/23/20 1602    Subjective Pt arrived with compression hose on, reports dressings slid down and he removed on Sunday.  Stated he forgot the foam at home.    Currently in Pain? No/denies                           Wound Therapy - 07/23/20 0001     Subjective Pt arrived with compression hose on, reports dressings slid down and he removed on Sunday.  Stated he forgot the foam at home.    Patient and Family Stated Goals to have wound healed    Date of Onset 06/18/20   approproximate   Prior Treatments honey gel     Pain Scale 0-10    Evaluation and Treatment Procedures Explained to Patient/Family Yes    Evaluation and Treatment Procedures agreed to    Wound Properties Date First Assessed: 07/09/20 Time First Assessed: 0935 Wound Type: Venous stasis ulcer Location: Leg Location Orientation: Right;Posterior;Lower Wound Description (Comments): posterior leg wound Present on Admission: Yes   Dressing Type Impregnated gauze (bismuth);Gauze (Comment);Tape dressing   xeroform, 2x2, medipore tape   Dressing Changed Changed    Dressing Status Old drainage    Dressing Change Frequency PRN    Site / Wound Assessment Pink;Pale;Brown;Dusky;Bleeding    % Wound base Red or Granulating 75%    % Wound base Yellow/Fibrinous Exudate 20%    % Wound base Other/Granulation Tissue (Comment) 5%    Peri-wound Assessment Bleeding   dry   Margins Attached edges (approximated)    Drainage Amount Scant    Drainage Description Serosanguineous  Treatment Cleansed;Debridement (Selective)    Selective Debridement - Location wounds posterior LE    Selective Debridement - Tools Used Forceps   q tips   Selective Debridement - Tissue Removed slough, dry tissue, fibrotic/devitalized tissue    Wound Therapy - Clinical Statement Pt arrived without foam and wearing his compressions garments, reports dressings slid down and he changed on Sunday.  Wound progressint with increased granulation tissue and decreased slough.  Cleasned well and debrided away necrotic tissue, devitalized tissue to promote approximation and improved skin integrity.  Continues to have bleeding from 2 openings and dry skin perimeter.  Vaseline, xeroform and gauze with medipore tape.  Requested pt to  bring foam next session and will return to profore dressings.      Wound Therapy - Functional Problem List difficulty walking , bathing     Factors Delaying/Impairing Wound Healing Vascular compromise;Tobacco use    Wound Therapy - Frequency 2X / week    Wound Therapy - Current Recommendations PT    Wound Plan continue with debridement, manual and dressing changes as tolerated    Dressing  xeroform, 2x2, compression garment                      PT Short Term Goals - 07/15/20 1443      PT SHORT TERM GOAL #1   Title Patient to present with 0% slough wound and 100% red granulation tissue to demonstrate overall improvement of condition    Time 3    Period Weeks    Status On-going    Target Date 07/30/20      PT SHORT TERM GOAL #2   Title Patient will report max pain at 5/10 over last week to demonstrate improved QOL.    Time 3    Period Weeks    Status Partially Met    Target Date 07/30/20      PT SHORT TERM GOAL #3   Title Patient will don compression garments on left LE consistently over course of week to decrease risk of wound developement on left LE.    Time 3    Period Weeks    Status On-going    Target Date 07/30/20             PT Long Term Goals - 07/15/20 1443      PT LONG TERM GOAL #1   Title Patient to demonstrate full wound healing in wound  on right lower extremity    Time 6    Period Weeks    Status On-going      PT LONG TERM GOAL #2   Title Patient will report no pain in wound bed to demonstrate improved QOL.    Time 6    Period Weeks    Status On-going      PT LONG TERM GOAL #3   Title Patient will be able to verbalize importance of continued compliance for compression therapy in regards to reducing risk of of future wounds.    Time 6    Period Weeks    Status On-going                  Patient will benefit from skilled therapeutic intervention in order to improve the following deficits and impairments:     Visit  Diagnosis: Pain in right lower leg  Wound of right lower extremity, initial encounter  Difficulty in walking, not elsewhere classified     Problem List Patient Active Problem List  Diagnosis Date Noted  . Medicare annual wellness visit, subsequent 12/05/2019  . Nicotine dependence 12/05/2019  . CKD (chronic kidney disease) stage 3, GFR 30-59 ml/min 10/24/2019  . Prediabetes 10/24/2019  . Pedal edema 09/14/2017  . Tubular adenoma of colon 09/14/2017  . Chronic anticoagulation 11/14/2014  . Sickle cell trait ([place]) 08/29/2012  . 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 DVT (deep venous thrombosis) ([place]) 06/15/2011  . Pulmonary embolism ([place]) 06/15/2011  . 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   Ihor Austin, LPTA/CLT; CBIS [phone]  [name] 07/23/2020, 4:13 PM  [facility] [address] [place], [place], [address] Phone: [phone]   Fax:  [phone]  Name: Matthew Wise MRN: [id number] Date of Birth: [birth year]

## 2020-07-25 ENCOUNTER — Ambulatory Visit (HOSPITAL_COMMUNITY): Payer: Medicare Other | Admitting: Physical Therapy

## 2020-07-25 ENCOUNTER — Other Ambulatory Visit: Payer: Self-pay

## 2020-07-25 DIAGNOSIS — M79661 Pain in right lower leg: Secondary | ICD-10-CM

## 2020-07-25 DIAGNOSIS — S81801A Unspecified open wound, right lower leg, initial encounter: Secondary | ICD-10-CM | POA: Diagnosis not present

## 2020-07-25 DIAGNOSIS — R262 Difficulty in walking, not elsewhere classified: Secondary | ICD-10-CM | POA: Diagnosis not present

## 2020-07-25 NOTE — Therapy (Signed)
[place], [place], [address] Phone: [phone]   Fax:  [phone]  Wound Care Therapy  Patient Details  Name: Matthew Wise MRN: [id number] Date of Birth: 1961/05/26 Referring Provider (PT): Ulice Bold MD   Encounter Date: 07/25/2020   PT End of Session - 07/25/20 1338    Visit Number 6    Number of Visits 12    Date for PT Re-Evaluation 08/20/20    Authorization Type medicare    Progress Note Due on Visit 10    PT Start Time 1010    PT Stop Time 1040    PT Time Calculation (min) 30 min    Activity Tolerance Patient tolerated treatment well    Behavior During Therapy [facility] for tasks assessed/performed           Past Medical History:  Diagnosis Date  . Chronic anticoagulation 11/14/2014  . CKD (chronic kidney disease) stage 3, GFR 30-59 ml/min 10/24/2019  . CKD (chronic kidney disease) stage 3, GFR 30-59 ml/min 10/24/2019  . Clotting disorder ([place])   . 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 DVT (deep venous thrombosis) ([place]) 06/15/2011  . DVT of leg (deep venous thrombosis) ([facility])    rt. leg  . H/O PE (pulmonary embolism) 06/15/2011  . 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 Prediabetes 10/24/2019    Past Surgical History:  Procedure Laterality Date  . COLONOSCOPY WITH PROPOFOL N/A 10/29/2015   Procedure: COLONOSCOPY WITH PROPOFOL;  Surgeon: Danie Binder, MD;  [facility];  Service: Endoscopy;  Laterality: N/A;  930  . None to date  10/07/15    There were no vitals filed for this visit.               Wound Therapy - 07/25/20 1321    Subjective pt states the compression sotocking wored great and his leg did not swell any.    Patient and Family Stated Goals to have wound healed    Date of Onset 06/18/20   approproximate   Prior Treatments honey gel     Pain Scale  0-10    Pain Score 0-No pain    Evaluation and Treatment Procedures Explained to Patient/Family Yes    Evaluation and Treatment Procedures agreed to    Wound Properties Date First Assessed: 07/09/20 Time First Assessed: 0935 Wound Type: Venous stasis ulcer Location: Leg Location Orientation: Right;Posterior;Lower Wound Description (Comments): posterior leg wound Present on Admission: Yes   Dressing Type Impregnated gauze (bismuth)    Dressing Changed Changed    Dressing Status Old drainage    Dressing Change Frequency PRN    Site / Wound Assessment Red;Pink;Pale    % Wound base Red or Granulating 85%    % Wound base Yellow/Fibrinous Exudate 15%    Peri-wound Assessment Intact    Margins Attached edges (approximated)    Drainage Amount Scant    Drainage Description Serosanguineous    Treatment Cleansed;Debridement (Selective)    Selective Debridement - Location wounds posterior LE    Selective Debridement - Tools Used Forceps   q tips   Selective Debridement - Tissue Removed slough, dry tissue, fibrotic/devitalized tissue    Wound Therapy - Clinical Statement Noted approximation and overall improvement of wound this session.  Dressing remained intact with medipore and secured with compression garment.  Pt is to get new ones  soon and encouraged him to reach out to vendor regarding this.  Pt did bring his foam this session and instructed him to keep them with his compression bandages if ever needed again in the future.  Pt verbalized understanding.      Wound Therapy - Functional Problem List difficulty walking , bathing     Factors Delaying/Impairing Wound Healing Vascular compromise;Tobacco use    Wound Therapy - Frequency 2X / week    Wound Therapy - Current Recommendations PT    Wound Plan continue with debridement, manual and dressing changes as tolerated    Dressing  xeroform, 2x2, compression garment                     PT Short Term Goals - 07/15/20 1443      PT SHORT  TERM GOAL #1   Title Patient to present with 0% slough wound and 100% red granulation tissue to demonstrate overall improvement of condition    Time 3    Period Weeks    Status On-going    Target Date 07/30/20      PT SHORT TERM GOAL #2   Title Patient will report max pain at 5/10 over last week to demonstrate improved QOL.    Time 3    Period Weeks    Status Partially Met    Target Date 07/30/20      PT SHORT TERM GOAL #3   Title Patient will don compression garments on left LE consistently over course of week to decrease risk of wound developement on left LE.    Time 3    Period Weeks    Status On-going    Target Date 07/30/20             PT Long Term Goals - 07/15/20 1443      PT LONG TERM GOAL #1   Title Patient to demonstrate full wound healing in wound  on right lower extremity    Time 6    Period Weeks    Status On-going      PT LONG TERM GOAL #2   Title Patient will report no pain in wound bed to demonstrate improved QOL.    Time 6    Period Weeks    Status On-going      PT LONG TERM GOAL #3   Title Patient will be able to verbalize importance of continued compliance for compression therapy in regards to reducing risk of of future wounds.    Time 6    Period Weeks    Status On-going                  Patient will benefit from skilled therapeutic intervention in order to improve the following deficits and impairments:     Visit Diagnosis: Pain in right lower leg  Wound of right lower extremity, initial encounter  Difficulty in walking, not elsewhere classified     Problem List Patient Active Problem List   Diagnosis Date Noted  . Medicare annual wellness visit, subsequent 12/05/2019  . Nicotine dependence 12/05/2019  . CKD (chronic kidney disease) stage 3, GFR 30-59 ml/min 10/24/2019  . Prediabetes 10/24/2019  . Pedal edema 09/14/2017  . Tubular adenoma of colon 09/14/2017  . Chronic anticoagulation 11/14/2014  . Sickle cell trait  ([place]) 08/29/2012  . 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 DVT (deep venous thrombosis) ([place]) 06/15/2011  . Pulmonary embolism ([place]) 06/15/2011  . Congenital inferior vena cava interruption  06/15/2011  . 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 [place], [place], [address] Phone: [phone]   Fax:  [phone]  Name: HASNAIN MANHEIM MRN: [id number] Date of Birth: 1961/06/03

## 2020-07-29 ENCOUNTER — Ambulatory Visit (HOSPITAL_COMMUNITY): Payer: Medicare Other | Admitting: Physical Therapy

## 2020-07-29 ENCOUNTER — Other Ambulatory Visit: Payer: Self-pay

## 2020-07-29 DIAGNOSIS — S81801A Unspecified open wound, right lower leg, initial encounter: Secondary | ICD-10-CM

## 2020-07-29 DIAGNOSIS — M79661 Pain in right lower leg: Secondary | ICD-10-CM | POA: Diagnosis not present

## 2020-07-29 DIAGNOSIS — R262 Difficulty in walking, not elsewhere classified: Secondary | ICD-10-CM

## 2020-07-29 NOTE — Therapy (Signed)
[facility] [place], [place], [address] Phone: [phone]   Fax:  [phone]  Wound Care Therapy  Patient Details  Name: Matthew Wise MRN: [id number] Date of Birth: May 19, 1961 Referring Provider (PT): Ulice Bold MD   Encounter Date: 07/29/2020   PT End of Session - 07/29/20 1447    Visit Number 7    Number of Visits 12    Date for PT Re-Evaluation 08/20/20    Authorization Type medicare    Progress Note Due on Visit 10    PT Start Time 4920    PT Stop Time 1433    PT Time Calculation (min) 28 min    Activity Tolerance Patient tolerated treatment well    Behavior During Therapy [facility] for tasks assessed/performed           Past Medical History:  Diagnosis Date  . Chronic anticoagulation 11/14/2014  . CKD (chronic kidney disease) stage 3, GFR 30-59 ml/min 10/24/2019  . CKD (chronic kidney disease) stage 3, GFR 30-59 ml/min 10/24/2019  . Clotting disorder ([place])   . 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 DVT (deep venous thrombosis) ([place]) 06/15/2011  . DVT of leg (deep venous thrombosis) ([facility])    rt. leg  . H/O PE (pulmonary embolism) 06/15/2011  . 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 Prediabetes 10/24/2019    Past Surgical History:  Procedure Laterality Date  . COLONOSCOPY WITH PROPOFOL N/A 10/29/2015   Procedure: COLONOSCOPY WITH PROPOFOL;  Surgeon: Danie Binder, MD;  [facility];  Service: Endoscopy;  Laterality: N/A;  930  . None to date  10/07/15    There were no vitals filed for this visit.               Wound Therapy - 07/29/20 1445    Subjective Pt continues to do well    Patient and Family Stated Goals to have wound healed    Date of Onset 06/18/20   approproximate   Prior Treatments honey gel     Pain Scale 0-10    Pain Score 0-No pain    Evaluation and Treatment  Procedures Explained to Patient/Family Yes    Evaluation and Treatment Procedures agreed to    Wound Properties Date First Assessed: 07/09/20 Time First Assessed: 0935 Wound Type: Venous stasis ulcer Location: Leg Location Orientation: Right;Posterior;Lower Wound Description (Comments): posterior leg wound Present on Admission: Yes   Dressing Type Impregnated gauze (bismuth)    Dressing Changed Changed    Dressing Status Old drainage    Dressing Change Frequency PRN    Site / Wound Assessment Red;Pink    % Wound base Red or Granulating 95%    % Wound base Yellow/Fibrinous Exudate 5%    Peri-wound Assessment Intact    Wound Length (cm) 1 cm    Wound Width (cm) 1 cm    Wound Depth (cm) 0.2 cm    Wound Volume (cm^3) 0.2 cm^3    Wound Surface Area (cm^2) 1 cm^2    Margins Attached edges (approximated)    Drainage Amount Scant    Drainage Description Serosanguineous    Treatment Cleansed;Debridement (Selective)    Selective Debridement - Location wounds posterior LE    Selective Debridement - Tools Used Forceps   q tips   Selective Debridement - Tissue Removed slough, dry tissue, fibrotic/devitalized tissue  Wound Therapy - Clinical Statement Continued improvement with increased granlualtion and approximation.  Cleanseed well and debrided devitalized tissue around perimeter.  continued with xeroform, gauze and medipore.      Wound Therapy - Functional Problem List difficulty walking , bathing     Factors Delaying/Impairing Wound Healing Vascular compromise;Tobacco use    Wound Therapy - Frequency 2X / week    Wound Therapy - Current Recommendations PT    Wound Plan continue with debridement, manual and dressing changes as tolerated    Dressing  xeroform, 2x2, compression garment                     PT Short Term Goals - 07/15/20 1443      PT SHORT TERM GOAL #1   Title Patient to present with 0% slough wound and 100% red granulation tissue to demonstrate overall improvement  of condition    Time 3    Period Weeks    Status On-going    Target Date 07/30/20      PT SHORT TERM GOAL #2   Title Patient will report max pain at 5/10 over last week to demonstrate improved QOL.    Time 3    Period Weeks    Status Partially Met    Target Date 07/30/20      PT SHORT TERM GOAL #3   Title Patient will don compression garments on left LE consistently over course of week to decrease risk of wound developement on left LE.    Time 3    Period Weeks    Status On-going    Target Date 07/30/20             PT Long Term Goals - 07/15/20 1443      PT LONG TERM GOAL #1   Title Patient to demonstrate full wound healing in wound  on right lower extremity    Time 6    Period Weeks    Status On-going      PT LONG TERM GOAL #2   Title Patient will report no pain in wound bed to demonstrate improved QOL.    Time 6    Period Weeks    Status On-going      PT LONG TERM GOAL #3   Title Patient will be able to verbalize importance of continued compliance for compression therapy in regards to reducing risk of of future wounds.    Time 6    Period Weeks    Status On-going                  Patient will benefit from skilled therapeutic intervention in order to improve the following deficits and impairments:     Visit Diagnosis: Pain in right lower leg  Wound of right lower extremity, initial encounter  Difficulty in walking, not elsewhere classified     Problem List Patient Active Problem List   Diagnosis Date Noted  . Medicare annual wellness visit, subsequent 12/05/2019  . Nicotine dependence 12/05/2019  . CKD (chronic kidney disease) stage 3, GFR 30-59 ml/min 10/24/2019  . Prediabetes 10/24/2019  . Pedal edema 09/14/2017  . Tubular adenoma of colon 09/14/2017  . Chronic anticoagulation 11/14/2014  . Sickle cell trait ([place]) 08/29/2012  . 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 DVT (deep venous thrombosis) ([place]) 06/15/2011  .  Pulmonary embolism ([place]) 06/15/2011  . 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 [place], [place], [address] Phone: [phone]   Fax:  [phone]  Name: ADDIEL MCCARDLE MRN: [id number] Date of Birth: 1960/12/01

## 2020-07-31 ENCOUNTER — Other Ambulatory Visit: Payer: Self-pay

## 2020-07-31 ENCOUNTER — Ambulatory Visit (HOSPITAL_COMMUNITY): Payer: Medicare Other | Attending: Nephrology | Admitting: Physical Therapy

## 2020-07-31 DIAGNOSIS — S81801A Unspecified open wound, right lower leg, initial encounter: Secondary | ICD-10-CM | POA: Insufficient documentation

## 2020-07-31 DIAGNOSIS — R262 Difficulty in walking, not elsewhere classified: Secondary | ICD-10-CM | POA: Insufficient documentation

## 2020-07-31 DIAGNOSIS — M79661 Pain in right lower leg: Secondary | ICD-10-CM | POA: Insufficient documentation

## 2020-07-31 NOTE — Therapy (Signed)
[place], [place], [address] Phone: [phone]   Fax:  [phone]  Wound Care Therapy  Patient Details  Name: Matthew Wise MRN: [id number] Date of Birth: 11/26/1961 Referring Provider (PT): Ulice Bold MD   Encounter Date: 07/31/2020   PT End of Session - 07/31/20 1454    Visit Number 8    Number of Visits 12    Date for PT Re-Evaluation 08/20/20    Authorization Type medicare    Progress Note Due on Visit 10    PT Start Time 6606    PT Stop Time 1430    PT Time Calculation (min) 25 min    Activity Tolerance Patient tolerated treatment well    Behavior During Therapy [facility] for tasks assessed/performed           Past Medical History:  Diagnosis Date  . Chronic anticoagulation 11/14/2014  . CKD (chronic kidney disease) stage 3, GFR 30-59 ml/min 10/24/2019  . CKD (chronic kidney disease) stage 3, GFR 30-59 ml/min 10/24/2019  . Clotting disorder ([place])   . 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 DVT (deep venous thrombosis) ([place]) 06/15/2011  . DVT of leg (deep venous thrombosis) ([facility])    rt. leg  . H/O PE (pulmonary embolism) 06/15/2011  . 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 Prediabetes 10/24/2019    Past Surgical History:  Procedure Laterality Date  . COLONOSCOPY WITH PROPOFOL N/A 10/29/2015   Procedure: COLONOSCOPY WITH PROPOFOL;  Surgeon: Danie Binder, MD;  [facility];  Service: Endoscopy;  Laterality: N/A;  930  . None to date  10/07/15    There were no vitals filed for this visit.               Wound Therapy - 07/31/20 0001    Subjective no pain or issues    Patient and Family Stated Goals to have wound healed    Date of Onset 06/18/20   approproximate   Prior Treatments honey gel     Pain Scale 0-10    Pain Score 0-No pain    Evaluation and Treatment  Procedures Explained to Patient/Family Yes    Evaluation and Treatment Procedures agreed to    Wound Properties Date First Assessed: 07/09/20 Time First Assessed: 0935 Wound Type: Venous stasis ulcer Location: Leg Location Orientation: Right;Posterior;Lower Wound Description (Comments): posterior leg wound Present on Admission: Yes   Dressing Type Impregnated gauze (bismuth)    Dressing Changed Changed    Dressing Status Old drainage    Dressing Change Frequency PRN    Site / Wound Assessment Red    % Wound base Red or Granulating 95%    % Wound base Yellow/Fibrinous Exudate 5%    Peri-wound Assessment Intact;Induration    Margins Attached edges (approximated)    Drainage Amount Scant    Drainage Description Serosanguineous    Treatment Cleansed;Debridement (Selective)    Selective Debridement - Location wound continues to fill with slough and devitalized tissue perimeter that requires debridment to reveal  95% granulation beneath.  Overall is healing in nicely.  Discussed possibility of reducing to 1X week next week if with continued improvement.  To recheck status of new compression garments.     Selective Debridement - Tools Used Forceps   q tips   Selective Debridement - Tissue Removed slough, dry tissue, fibrotic/devitalized tissue  Wound Therapy - Clinical Statement Continued improvement with increased granlualtion and approximation.  Cleanseed well and debrided devitalized tissue around perimeter.  continued with xeroform, gauze and medipore.      Wound Therapy - Functional Problem List difficulty walking , bathing     Factors Delaying/Impairing Wound Healing Vascular compromise;Tobacco use    Wound Therapy - Frequency 2X / week    Wound Therapy - Current Recommendations PT    Wound Plan continue with debridement, manual and dressing changes as tolerated    Dressing  xeroform, 2x2, compression garment                     PT Short Term Goals - 07/15/20 1443      PT  SHORT TERM GOAL #1   Title Patient to present with 0% slough wound and 100% red granulation tissue to demonstrate overall improvement of condition    Time 3    Period Weeks    Status On-going    Target Date 07/30/20      PT SHORT TERM GOAL #2   Title Patient will report max pain at 5/10 over last week to demonstrate improved QOL.    Time 3    Period Weeks    Status Partially Met    Target Date 07/30/20      PT SHORT TERM GOAL #3   Title Patient will don compression garments on left LE consistently over course of week to decrease risk of wound developement on left LE.    Time 3    Period Weeks    Status On-going    Target Date 07/30/20             PT Long Term Goals - 07/15/20 1443      PT LONG TERM GOAL #1   Title Patient to demonstrate full wound healing in wound  on right lower extremity    Time 6    Period Weeks    Status On-going      PT LONG TERM GOAL #2   Title Patient will report no pain in wound bed to demonstrate improved QOL.    Time 6    Period Weeks    Status On-going      PT LONG TERM GOAL #3   Title Patient will be able to verbalize importance of continued compliance for compression therapy in regards to reducing risk of of future wounds.    Time 6    Period Weeks    Status On-going                  Patient will benefit from skilled therapeutic intervention in order to improve the following deficits and impairments:     Visit Diagnosis: Wound of right lower extremity, initial encounter  Difficulty in walking, not elsewhere classified     Problem List Patient Active Problem List   Diagnosis Date Noted  . Medicare annual wellness visit, subsequent 12/05/2019  . Nicotine dependence 12/05/2019  . CKD (chronic kidney disease) stage 3, GFR 30-59 ml/min 10/24/2019  . Prediabetes 10/24/2019  . Pedal edema 09/14/2017  . Tubular adenoma of colon 09/14/2017  . Chronic anticoagulation 11/14/2014  . Sickle cell trait ([place]) 08/29/2012  .  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 DVT (deep venous thrombosis) ([place]) 06/15/2011  . Pulmonary embolism ([place]) 06/15/2011  . 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   Teena Irani, PTA/CLT [place], Zymeir Salminen B 07/31/2020,  2:54 PM  [place], [place], [address] Phone: [phone]   Fax:  [phone]  Name: Matthew Wise MRN: [id number] Date of Birth: 1961/03/15

## 2020-08-06 ENCOUNTER — Other Ambulatory Visit: Payer: Self-pay

## 2020-08-06 ENCOUNTER — Ambulatory Visit (HOSPITAL_COMMUNITY): Payer: Medicare Other

## 2020-08-06 ENCOUNTER — Encounter (HOSPITAL_COMMUNITY): Payer: Self-pay

## 2020-08-06 ENCOUNTER — Telehealth (HOSPITAL_COMMUNITY): Payer: Self-pay | Admitting: Physical Therapy

## 2020-08-06 DIAGNOSIS — S81801A Unspecified open wound, right lower leg, initial encounter: Secondary | ICD-10-CM | POA: Diagnosis not present

## 2020-08-06 DIAGNOSIS — R262 Difficulty in walking, not elsewhere classified: Secondary | ICD-10-CM

## 2020-08-06 DIAGNOSIS — M79661 Pain in right lower leg: Secondary | ICD-10-CM | POA: Diagnosis not present

## 2020-08-06 NOTE — Therapy (Signed)
[place], [place], [address] Phone: [phone]   Fax:  [phone]  Wound Care Therapy  Patient Details  Name: Matthew Wise MRN: [id number] Date of Birth: 1961/11/11 Referring Provider (PT): Ulice Bold MD   Encounter Date: 08/06/2020   PT End of Session - 08/06/20 1436    Visit Number 9    Number of Visits 12    Date for PT Re-Evaluation 08/20/20    Authorization Type medicare    Progress Note Due on Visit 10    PT Start Time 9528    PT Stop Time 1430    PT Time Calculation (min) 25 min    Activity Tolerance Patient tolerated treatment well    Behavior During Therapy [facility] for tasks assessed/performed           Past Medical History:  Diagnosis Date  . Chronic anticoagulation 11/14/2014  . CKD (chronic kidney disease) stage 3, GFR 30-59 ml/min 10/24/2019  . CKD (chronic kidney disease) stage 3, GFR 30-59 ml/min 10/24/2019  . Clotting disorder ([place])   . 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 DVT (deep venous thrombosis) ([place]) 06/15/2011  . DVT of leg (deep venous thrombosis) ([facility])    rt. leg  . H/O PE (pulmonary embolism) 06/15/2011  . 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 Prediabetes 10/24/2019    Past Surgical History:  Procedure Laterality Date  . COLONOSCOPY WITH PROPOFOL N/A 10/29/2015   Procedure: COLONOSCOPY WITH PROPOFOL;  Surgeon: Danie Binder, MD;  [facility];  Service: Endoscopy;  Laterality: N/A;  930  . None to date  10/07/15    There were no vitals filed for this visit.    Subjective Assessment - 08/06/20 1428    Subjective Pt stated leg is feeling good, no reports of pain.  Reports it does bother him some at night                     Wound Therapy - 08/06/20 0001    Subjective Pt stated leg is feeling good, no reports of pain.  Reports it does  bother him some at night    Patient and Family Stated Goals to have wound healed    Date of Onset 06/18/20   approximate   Prior Treatments honey gel     Pain Scale 0-10    Pain Score 0-No pain    Evaluation and Treatment Procedures Explained to Patient/Family Yes    Evaluation and Treatment Procedures agreed to    Wound Properties Date First Assessed: 07/09/20 Time First Assessed: 0935 Wound Type: Venous stasis ulcer Location: Leg Location Orientation: Right;Posterior;Lower Wound Description (Comments): posterior leg wound Present on Admission: Yes   Dressing Type Impregnated gauze (bismuth);Tape dressing    Dressing Changed Changed    Dressing Status Old drainage    Dressing Change Frequency PRN    Site / Wound Assessment Red    % Wound base Red or Granulating 95%    % Wound base Yellow/Fibrinous Exudate 5%    Peri-wound Assessment Intact;Induration    Margins Attached edges (approximated)    Drainage Amount Scant    Drainage Description Serosanguineous    Treatment Cleansed;Debridement (Selective)    Selective Debridement - Location Wound continues to have slough in wound bed and devitalized tissue perimeter of wound, able to acheive 100% granulation followoing  selective debridment.  Continued with xeroform and medipore tape with compression garments.  No reorts of pain.  Due to positive progressing, feel pt can reduce to 1x/week.      Selective Debridement - Tools Used Forceps   qtips   Selective Debridement - Tissue Removed slough, dry tissue, fibrotic/devitalized tissue    Wound Therapy - Clinical Statement Continued improvement with increased granlualtion and approximation.  Cleanseed well and debrided devitalized tissue around perimeter.  continued with xeroform, gauze and medipore.      Wound Therapy - Functional Problem List difficulty walking , bathing     Factors Delaying/Impairing Wound Healing Vascular compromise;Tobacco use    Wound Therapy - Frequency 2X / week    Wound  Therapy - Current Recommendations PT    Wound Plan continue with debridement, manual and dressing changes as tolerated    Dressing  xeroform, 2x2, compression garment                     PT Short Term Goals - 07/15/20 1443      PT SHORT TERM GOAL #1   Title Patient to present with 0% slough wound and 100% red granulation tissue to demonstrate overall improvement of condition    Time 3    Period Weeks    Status On-going    Target Date 07/30/20      PT SHORT TERM GOAL #2   Title Patient will report max pain at 5/10 over last week to demonstrate improved QOL.    Time 3    Period Weeks    Status Partially Met    Target Date 07/30/20      PT SHORT TERM GOAL #3   Title Patient will don compression garments on left LE consistently over course of week to decrease risk of wound developement on left LE.    Time 3    Period Weeks    Status On-going    Target Date 07/30/20             PT Long Term Goals - 07/15/20 1443      PT LONG TERM GOAL #1   Title Patient to demonstrate full wound healing in wound  on right lower extremity    Time 6    Period Weeks    Status On-going      PT LONG TERM GOAL #2   Title Patient will report no pain in wound bed to demonstrate improved QOL.    Time 6    Period Weeks    Status On-going      PT LONG TERM GOAL #3   Title Patient will be able to verbalize importance of continued compliance for compression therapy in regards to reducing risk of of future wounds.    Time 6    Period Weeks    Status On-going                  Patient will benefit from skilled therapeutic intervention in order to improve the following deficits and impairments:     Visit Diagnosis: Wound of right lower extremity, initial encounter  Difficulty in walking, not elsewhere classified  Pain in right lower leg     Problem List Patient Active Problem List   Diagnosis Date Noted  . Medicare annual wellness visit, subsequent 12/05/2019  .  Nicotine dependence 12/05/2019  . CKD (chronic kidney disease) stage 3, GFR 30-59 ml/min 10/24/2019  . Prediabetes 10/24/2019  . Pedal edema 09/14/2017  . Tubular  adenoma of colon 09/14/2017  . Chronic anticoagulation 11/14/2014  . Sickle cell trait ([place]) 08/29/2012  . 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 DVT (deep venous thrombosis) ([place]) 06/15/2011  . Pulmonary embolism ([place]) 06/15/2011  . 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 [place], [place], [address] Phone: [phone]   Fax:  [phone]  Name: FINNBAR CEDILLOS MRN: [id number] Date of Birth: [birth year]

## 2020-08-06 NOTE — Telephone Encounter (Signed)
Patient called to confirm that his next appointment would be cancelled and scheduled 2 additional appointments.   Clarene Critchley PT, DPT 4:25 PM, 08/06/20 [phone]

## 2020-08-08 ENCOUNTER — Ambulatory Visit (HOSPITAL_COMMUNITY): Payer: Medicare Other | Admitting: Physical Therapy

## 2020-08-13 ENCOUNTER — Telehealth (HOSPITAL_COMMUNITY): Payer: Self-pay

## 2020-08-13 ENCOUNTER — Ambulatory Visit (HOSPITAL_COMMUNITY): Payer: Self-pay

## 2020-08-13 NOTE — Telephone Encounter (Signed)
Pt did not sleep well last night and he will not be here today

## 2020-08-19 ENCOUNTER — Ambulatory Visit (HOSPITAL_COMMUNITY): Payer: Medicare Other | Admitting: Physical Therapy

## 2020-08-19 ENCOUNTER — Encounter (HOSPITAL_COMMUNITY): Payer: Self-pay | Admitting: Physical Therapy

## 2020-08-19 ENCOUNTER — Other Ambulatory Visit: Payer: Self-pay

## 2020-08-19 DIAGNOSIS — M79661 Pain in right lower leg: Secondary | ICD-10-CM | POA: Diagnosis not present

## 2020-08-19 DIAGNOSIS — S81801A Unspecified open wound, right lower leg, initial encounter: Secondary | ICD-10-CM | POA: Diagnosis not present

## 2020-08-19 DIAGNOSIS — R262 Difficulty in walking, not elsewhere classified: Secondary | ICD-10-CM

## 2020-08-19 NOTE — Therapy (Signed)
[place], [place], [address] Phone: [phone]   Fax:  [phone]  Wound Care Therapy and Discharge Note  Patient Details  Name: Matthew Wise MRN: [id number] Date of Birth: [birth year] Referring Provider (PT): Ulice Bold MD  PHYSICAL THERAPY DISCHARGE SUMMARY  Visits from Start of Care: 10  Current functional level related to goals / functional outcomes: All goals met    Remaining deficits: Wound completely healed   Education / Equipment: See below  Plan: Patient agrees to discharge.  Patient goals were met. Patient is being discharged due to meeting the stated rehab goals.  ?????        Encounter Date: 08/19/2020   PT End of Session - 08/19/20 1419    Visit Number 10    Number of Visits 12    Date for PT Re-Evaluation 08/20/20    Authorization Type medicare    Progress Note Due on Visit 10    PT Start Time 1401    PT Stop Time 1419    PT Time Calculation (min) 18 min    Activity Tolerance Patient tolerated treatment well    Behavior During Therapy [facility] for tasks assessed/performed           Past Medical History:  Diagnosis Date  . Chronic anticoagulation 11/14/2014  . CKD (chronic kidney disease) stage 3, GFR 30-59 ml/min 10/24/2019  . CKD (chronic kidney disease) stage 3, GFR 30-59 ml/min 10/24/2019  . Clotting disorder ([place])   . 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 DVT (deep venous thrombosis) ([place]) 06/15/2011  . DVT of leg (deep venous thrombosis) ([facility])    rt. leg  . H/O PE (pulmonary embolism) 06/15/2011  . 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 Prediabetes 10/24/2019    Past Surgical History:  Procedure Laterality Date  . COLONOSCOPY WITH PROPOFOL N/A 10/29/2015   Procedure: COLONOSCOPY WITH PROPOFOL;  Surgeon: Danie Binder, MD;  [facility];  Service:  Endoscopy;  Laterality: N/A;  930  . None to date  10/07/15    There were no vitals filed for this visit.      [facility] PT Assessment - 08/19/20 0001      Assessment   Medical Diagnosis R lower leg wound    Referring Provider (PT) Ulice Bold MD                   Wound Therapy - 08/19/20 0001    Subjective reports took off dressing a few days ago, reports no drainage. States it doesn't really hurt    Patient and Family Stated Goals to have wound healed    Date of Onset 06/18/20    Prior Treatments honey gel     Pain Scale 0-10    Pain Score 0-No pain    Evaluation and Treatment Procedures Explained to Patient/Family Yes    Evaluation and Treatment Procedures agreed to    Wound Properties Date First Assessed: 07/09/20 Time First Assessed: 0935 Wound Type: Venous stasis ulcer Location: Leg Location Orientation: Right;Posterior;Lower Wound Description (Comments): posterior leg wound Present on Admission: Yes Final Assessment Date: 08/19/20 Final Assessment Time: 1411   Dressing Type --   compression stocking   Selective Debridement - Location posterior leg    Selective Debridement - Tools Used Forceps    Selective Debridement - Tissue Removed dry devitalized tissue  Wound Therapy - Clinical Statement Patient's wound present with dry tissue over wound bed, once removed wound 100% healed. All goals met at this time. Educated patient in continued skin care to reduce likelihood of wound returning. Continued pain/tenderness noted in surrounding area to where wound was (this area also indurated and dry). Educated patient on skin care and massage to help improve tissue extensibility. Answered all questions prior to end of session. Patient to discharge to self care secondary to wound healed.     Wound Therapy - Functional Problem List difficulty walking , bathing     Factors Delaying/Impairing Wound Healing Vascular compromise;Tobacco use    Wound Therapy - Frequency 2X / week     Wound Therapy - Current Recommendations PT    Wound Plan pt to DC from PT to self care                   PT Education - 08/19/20 1420    Education Details on continued skin care, moisturizing, massage to indurated areas    Person(s) Educated Patient    Methods Explanation    Comprehension Verbalized understanding            PT Short Term Goals - 08/19/20 1420      PT SHORT TERM GOAL #1   Title Patient to present with 0% slough wound and 100% red granulation tissue to demonstrate overall improvement of condition    Time 3    Period Weeks    Status Achieved    Target Date 07/30/20      PT SHORT TERM GOAL #2   Title Patient will report max pain at 5/10 over last week to demonstrate improved QOL.    Time 3    Period Weeks    Status Achieved    Target Date 07/30/20      PT SHORT TERM GOAL #3   Title Patient will don compression garments on left LE consistently over course of week to decrease risk of wound developement on left LE.    Time 3    Period Weeks    Status Achieved    Target Date 07/30/20             PT Long Term Goals - 08/19/20 1420      PT LONG TERM GOAL #1   Title Patient to demonstrate full wound healing in wound  on right lower extremity    Time 6    Period Weeks    Status Achieved      PT LONG TERM GOAL #2   Title Patient will report no pain in wound bed to demonstrate improved QOL.    Time 6    Period Weeks    Status Achieved      PT LONG TERM GOAL #3   Title Patient will be able to verbalize importance of continued compliance for compression therapy in regards to reducing risk of of future wounds.    Time 6    Period Weeks    Status Achieved                 Plan - 08/19/20 1421    Clinical Impression Statement see above    Personal Factors and Comorbidities Comorbidity 1;Comorbidity 2;Comorbidity 3+    Comorbidities stage 3 chronic kidney disease, smoker, venous insufficinency, varicose veins    Examination-Activity  Limitations Hygiene/Grooming;Locomotion Level    Stability/Clinical Decision Making Stable/Uncomplicated    Rehab Potential Fair    PT Frequency 2x /  week    PT Duration 6 weeks    PT Treatment/Interventions ADLs/Self Care Home Management;Compression bandaging;Scar mobilization;Passive range of motion;Manual lymph drainage;Manual techniques;Therapeutic exercise;Cryotherapy;Therapeutic activities    PT Next Visit Plan DC from PT to self care    Consulted and Agree with Plan of Care Patient           Patient will benefit from skilled therapeutic intervention in order to improve the following deficits and impairments:  Pain, Difficulty walking, Decreased mobility, Decreased scar mobility, Decreased skin integrity, Increased edema  Visit Diagnosis: Wound of right lower extremity, initial encounter  Difficulty in walking, not elsewhere classified  Pain in right lower leg     Problem List Patient Active Problem List   Diagnosis Date Noted  . Medicare annual wellness visit, subsequent 12/05/2019  . Nicotine dependence 12/05/2019  . CKD (chronic kidney disease) stage 3, GFR 30-59 ml/min 10/24/2019  . Prediabetes 10/24/2019  . Pedal edema 09/14/2017  . Tubular adenoma of colon 09/14/2017  . Chronic anticoagulation 11/14/2014  . Sickle cell trait ([place]) 08/29/2012  . 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 DVT (deep venous thrombosis) ([place]) 06/15/2011  . Pulmonary embolism ([place]) 06/15/2011  . 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 [place], [place], [address] Phone: [phone]   Fax:  [phone]  Name: ZONG MCQUARRIE MRN: [id number] Date of Birth: 17-Apr-1961

## 2020-09-07 ENCOUNTER — Emergency Department (HOSPITAL_COMMUNITY): Payer: Medicare Other

## 2020-09-07 ENCOUNTER — Inpatient Hospital Stay (HOSPITAL_COMMUNITY)
Admission: EM | Admit: 2020-09-07 | Discharge: 2020-09-11 | DRG: 065 | Disposition: A | Discharge status: Home / Self Care | Payer: Medicare Other | Attending: Internal Medicine | Admitting: Internal Medicine

## 2020-09-07 ENCOUNTER — Observation Stay (HOSPITAL_COMMUNITY): Payer: Medicare Other

## 2020-09-07 ENCOUNTER — Other Ambulatory Visit: Payer: Self-pay

## 2020-09-07 ENCOUNTER — Encounter (HOSPITAL_COMMUNITY): Payer: Self-pay | Admitting: Emergency Medicine

## 2020-09-07 DIAGNOSIS — R9431 Abnormal electrocardiogram [ECG] [EKG]: Secondary | ICD-10-CM | POA: Diagnosis not present

## 2020-09-07 DIAGNOSIS — Z91148 Patient's other noncompliance with medication regimen for other reason: Secondary | ICD-10-CM

## 2020-09-07 DIAGNOSIS — R200 Anesthesia of skin: Secondary | ICD-10-CM | POA: Diagnosis not present

## 2020-09-07 DIAGNOSIS — G459 Transient cerebral ischemic attack, unspecified: Secondary | ICD-10-CM | POA: Diagnosis not present

## 2020-09-07 DIAGNOSIS — Z20822 Contact with and (suspected) exposure to covid-19: Secondary | ICD-10-CM | POA: Diagnosis present

## 2020-09-07 DIAGNOSIS — I6340 Cerebral infarction due to embolism of unspecified cerebral artery: Secondary | ICD-10-CM | POA: Diagnosis not present

## 2020-09-07 DIAGNOSIS — I129 Hypertensive chronic kidney disease with stage 1 through stage 4 chronic kidney disease, or unspecified chronic kidney disease: Secondary | ICD-10-CM | POA: Diagnosis present

## 2020-09-07 DIAGNOSIS — R001 Bradycardia, unspecified: Secondary | ICD-10-CM | POA: Diagnosis not present

## 2020-09-07 DIAGNOSIS — Z9114 Patient's other noncompliance with medication regimen: Secondary | ICD-10-CM

## 2020-09-07 DIAGNOSIS — Z6837 Body mass index (BMI) 37.0-37.9, adult: Secondary | ICD-10-CM

## 2020-09-07 DIAGNOSIS — I10 Essential (primary) hypertension: Secondary | ICD-10-CM

## 2020-09-07 DIAGNOSIS — Z86718 Personal history of other venous thrombosis and embolism: Secondary | ICD-10-CM

## 2020-09-07 DIAGNOSIS — N1831 Chronic kidney disease, stage 3a: Secondary | ICD-10-CM | POA: Diagnosis not present

## 2020-09-07 DIAGNOSIS — I639 Cerebral infarction, unspecified: Secondary | ICD-10-CM | POA: Diagnosis present

## 2020-09-07 DIAGNOSIS — N183 Chronic kidney disease, stage 3 unspecified: Secondary | ICD-10-CM | POA: Diagnosis present

## 2020-09-07 DIAGNOSIS — I248 Other forms of acute ischemic heart disease: Secondary | ICD-10-CM | POA: Diagnosis present

## 2020-09-07 DIAGNOSIS — D573 Sickle-cell trait: Secondary | ICD-10-CM | POA: Diagnosis present

## 2020-09-07 DIAGNOSIS — R778 Other specified abnormalities of plasma proteins: Secondary | ICD-10-CM | POA: Diagnosis not present

## 2020-09-07 DIAGNOSIS — D6869 Other thrombophilia: Secondary | ICD-10-CM | POA: Diagnosis not present

## 2020-09-07 DIAGNOSIS — Z9119 Patient's noncompliance with other medical treatment and regimen: Secondary | ICD-10-CM

## 2020-09-07 DIAGNOSIS — R202 Paresthesia of skin: Secondary | ICD-10-CM | POA: Diagnosis not present

## 2020-09-07 DIAGNOSIS — Z7901 Long term (current) use of anticoagulants: Secondary | ICD-10-CM

## 2020-09-07 DIAGNOSIS — Q268 Other congenital malformations of great veins: Secondary | ICD-10-CM | POA: Diagnosis not present

## 2020-09-07 DIAGNOSIS — Z8679 Personal history of other diseases of the circulatory system: Secondary | ICD-10-CM

## 2020-09-07 DIAGNOSIS — I82403 Acute embolism and thrombosis of unspecified deep veins of lower extremity, bilateral: Secondary | ICD-10-CM | POA: Diagnosis not present

## 2020-09-07 DIAGNOSIS — R29818 Other symptoms and signs involving the nervous system: Secondary | ICD-10-CM | POA: Diagnosis not present

## 2020-09-07 DIAGNOSIS — R7303 Prediabetes: Secondary | ICD-10-CM | POA: Diagnosis present

## 2020-09-07 DIAGNOSIS — Z713 Dietary counseling and surveillance: Secondary | ICD-10-CM

## 2020-09-07 DIAGNOSIS — Z79899 Other long term (current) drug therapy: Secondary | ICD-10-CM

## 2020-09-07 DIAGNOSIS — I82409 Acute embolism and thrombosis of unspecified deep veins of unspecified lower extremity: Secondary | ICD-10-CM | POA: Diagnosis present

## 2020-09-07 DIAGNOSIS — E669 Obesity, unspecified: Secondary | ICD-10-CM | POA: Diagnosis present

## 2020-09-07 DIAGNOSIS — Z86711 Personal history of pulmonary embolism: Secondary | ICD-10-CM

## 2020-09-07 DIAGNOSIS — R42 Dizziness and giddiness: Principal | ICD-10-CM

## 2020-09-07 DIAGNOSIS — F17210 Nicotine dependence, cigarettes, uncomplicated: Secondary | ICD-10-CM | POA: Diagnosis present

## 2020-09-07 DIAGNOSIS — I2699 Other pulmonary embolism without acute cor pulmonale: Secondary | ICD-10-CM | POA: Diagnosis present

## 2020-09-07 DIAGNOSIS — E876 Hypokalemia: Secondary | ICD-10-CM | POA: Diagnosis present

## 2020-09-07 HISTORY — DX: Transient cerebral ischemic attack, unspecified: G45.9

## 2020-09-07 LAB — CBC WITH DIFFERENTIAL/PLATELET
Abs Immature Granulocytes: 0.01 10*3/uL (ref 0.00–0.07)
Basophils Absolute: 0 10*3/uL (ref 0.0–0.1)
Basophils Relative: 1 %
Eosinophils Absolute: 0.1 10*3/uL (ref 0.0–0.5)
Eosinophils Relative: 2 %
HCT: 44.3 % (ref 39.0–52.0)
Hemoglobin: 15.3 g/dL (ref 13.0–17.0)
Immature Granulocytes: 0 %
Lymphocytes Relative: 32 %
Lymphs Abs: 1.6 10*3/uL (ref 0.7–4.0)
MCH: 30.1 pg (ref 26.0–34.0)
MCHC: 34.5 g/dL (ref 30.0–36.0)
MCV: 87 fL (ref 80.0–100.0)
Monocytes Absolute: 0.5 10*3/uL (ref 0.1–1.0)
Monocytes Relative: 10 %
Neutro Abs: 2.7 10*3/uL (ref 1.7–7.7)
Neutrophils Relative %: 55 %
Platelets: 190 10*3/uL (ref 150–400)
RBC: 5.09 MIL/uL (ref 4.22–5.81)
RDW: 14.6 % (ref 11.5–15.5)
WBC: 4.9 10*3/uL (ref 4.0–10.5)
nRBC: 0 % (ref 0.0–0.2)

## 2020-09-07 LAB — TROPONIN I (HIGH SENSITIVITY)
Troponin I (High Sensitivity): 46 ng/L — ABNORMAL HIGH (ref <18)
Troponin I (High Sensitivity): 57 ng/L — ABNORMAL HIGH (ref <18)

## 2020-09-07 LAB — COMPREHENSIVE METABOLIC PANEL
ALT: 20 U/L (ref 0–44)
AST: 21 U/L (ref 15–41)
Albumin: 4 g/dL (ref 3.5–5.0)
Alkaline Phosphatase: 70 U/L (ref 38–126)
Anion gap: 9 (ref 5–15)
BUN: 20 mg/dL (ref 6–20)
CO2: 25 mmol/L (ref 22–32)
Calcium: 9.2 mg/dL (ref 8.9–10.3)
Chloride: 104 mmol/L (ref 98–111)
Creatinine, Ser: 1.65 mg/dL — ABNORMAL HIGH (ref 0.61–1.24)
GFR, Estimated: 45 mL/min — ABNORMAL LOW (ref >60)
Glucose, Bld: 92 mg/dL (ref 70–99)
Potassium: 4.3 mmol/L (ref 3.5–5.1)
Sodium: 138 mmol/L (ref 135–145)
Total Bilirubin: 1.2 mg/dL (ref 0.3–1.2)
Total Protein: 7.6 g/dL (ref 6.5–8.1)

## 2020-09-07 LAB — PROTIME-INR
INR: 1 (ref 0.8–1.2)
Prothrombin Time: 12.3 seconds (ref 11.4–15.2)

## 2020-09-07 LAB — URINALYSIS, ROUTINE W REFLEX MICROSCOPIC
Bilirubin Urine: NEGATIVE
Glucose, UA: NEGATIVE mg/dL
Hgb urine dipstick: NEGATIVE
Ketones, ur: NEGATIVE mg/dL
Leukocytes,Ua: NEGATIVE
Nitrite: NEGATIVE
Protein, ur: NEGATIVE mg/dL
Specific Gravity, Urine: 1.011 (ref 1.005–1.030)
pH: 5 (ref 5.0–8.0)

## 2020-09-07 LAB — RESPIRATORY PANEL BY RT PCR (FLU A&B, COVID)
Influenza A by PCR: NEGATIVE
Influenza B by PCR: NEGATIVE
SARS Coronavirus 2 by RT PCR: NEGATIVE

## 2020-09-07 LAB — CBG MONITORING, ED: Glucose-Capillary: 103 mg/dL — ABNORMAL HIGH (ref 70–99)

## 2020-09-07 IMAGING — CT CT ANGIO HEAD-NECK
2 of 8 series · 8 of 33 positions shown · IV contrast (Omnipaque or Isovue)
Comparison: Prior head CT from earlier the same day.

CLINICAL DATA: Initial evaluation for acute neuro deficit, stroke
suspected. Acute dizziness for 1 hour with left-sided tingling.

EXAM:
CT ANGIOGRAPHY HEAD AND NECK
TECHNIQUE: Multidetector CT imaging of the head and neck was performed using
the standard protocol during bolus administration of intravenous
contrast. Multiplanar CT image reconstructions and MIPs were
obtained to evaluate the vascular anatomy. Carotid stenosis
measurements (when applicable) are obtained utilizing NASCET
criteria, using the distal internal carotid diameter as the
denominator.
CONTRAST:  75mL OMNIPAQUE IOHEXOL 350 MG/ML SOLN

[Series 5: cta head & neck · axial · 0.43mm/px · z∈[-75,+195]mm · 6 of 765 slices shown]
[im 110/765  soft-tissue]
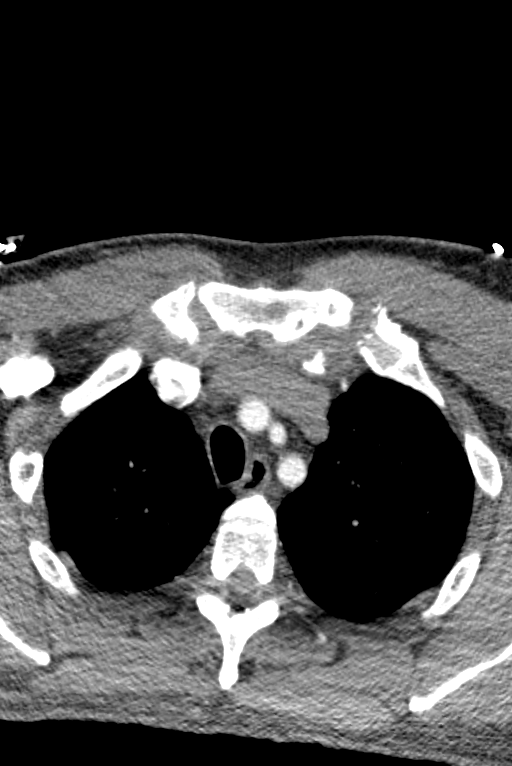
[im 219/765  bone]
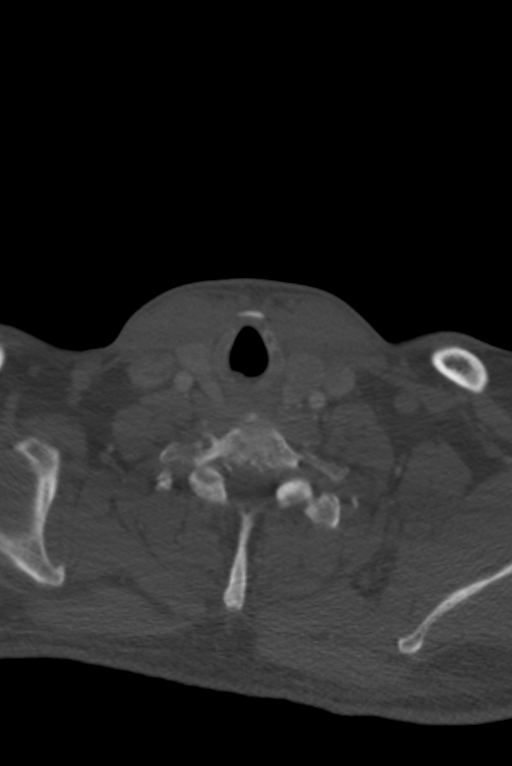
[im 328/765  soft-tissue]
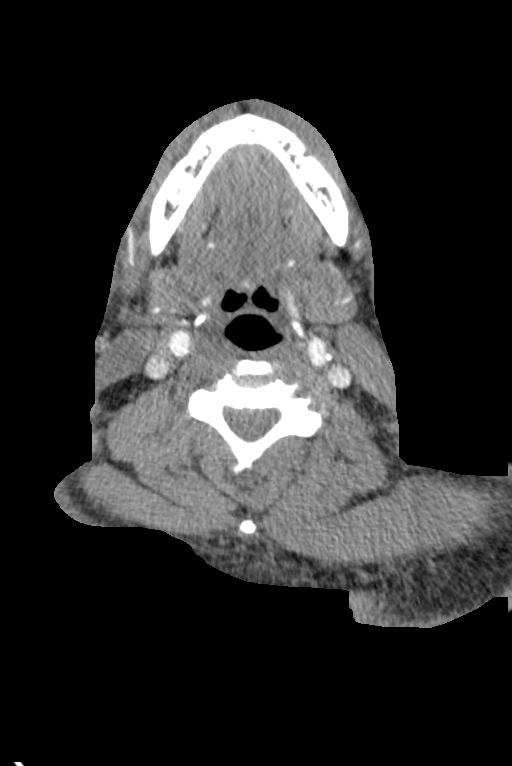
[im 437/765  bone]
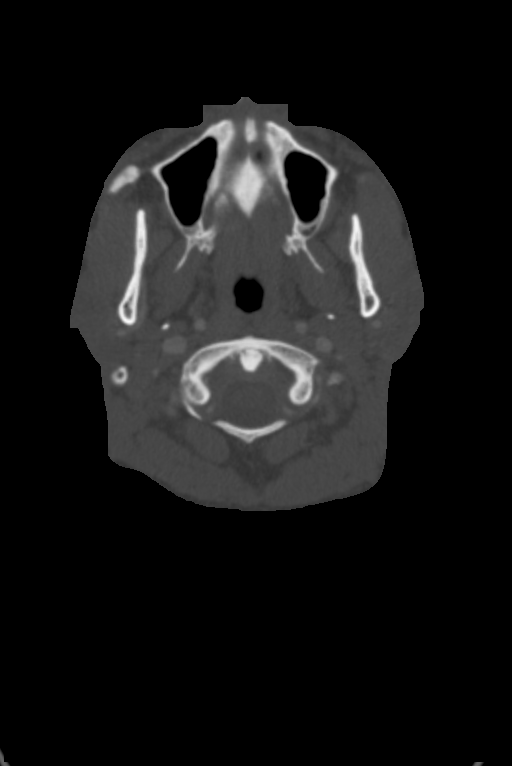
[im 546/765  soft-tissue]
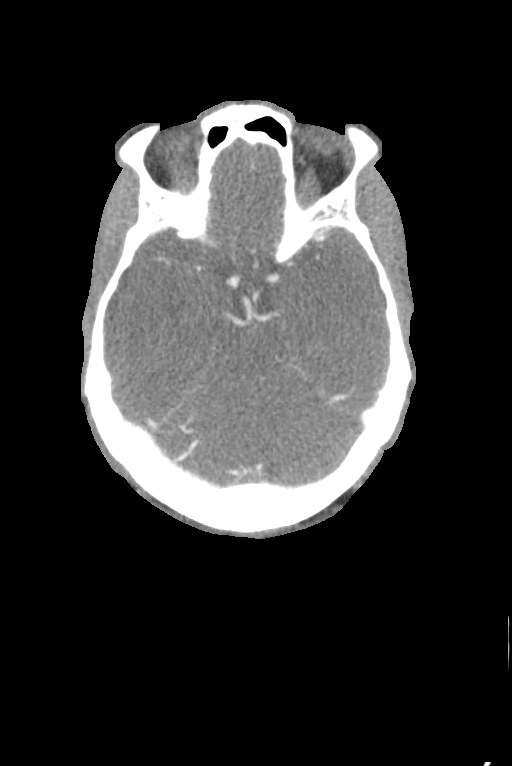
[im 655/765  bone]
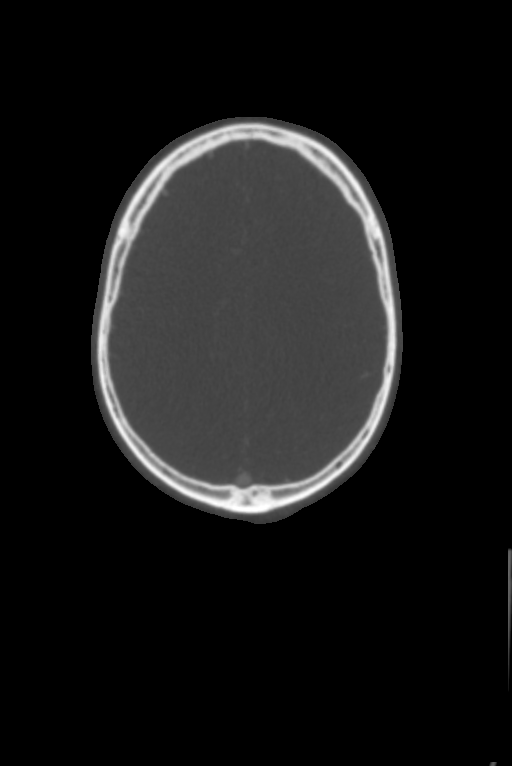

[Series 6: ax thins · axial · 0.43mm/px · z∈[-2,+124]mm · 2 of 384 slices shown]
[im 128/384  soft-tissue]
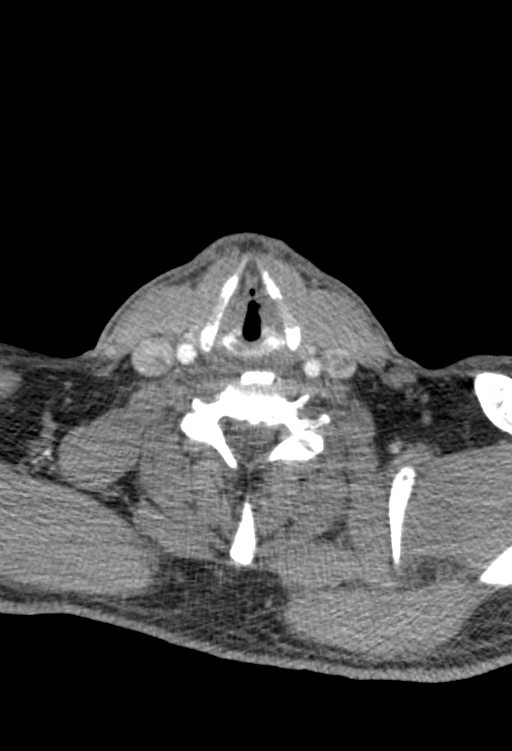
[im 256/384  soft-tissue]
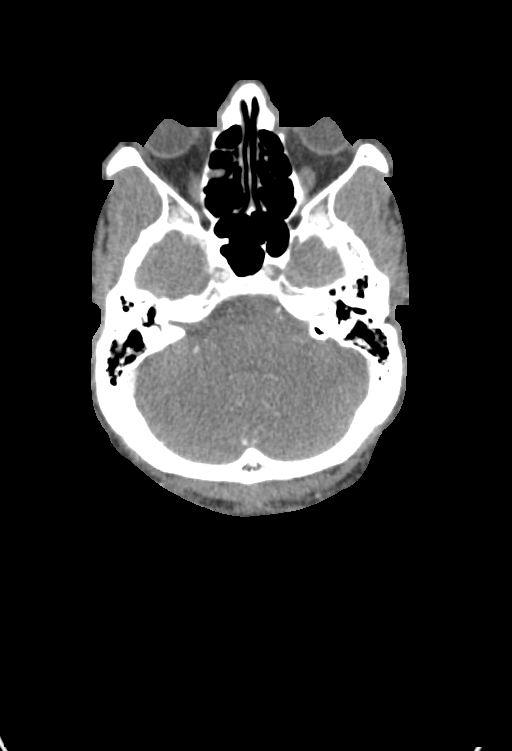

[8 of 33 positions shown; findings below may reference images not displayed]

FINDINGS: CTA NECK FINDINGS

Aortic arch: Visualized aortic arch of normal caliber with normal
branch pattern. No hemodynamically significant stenosis seen about
the origin of the great vessels.

Right carotid system: Right CCA widely patent from its origin to the
bifurcation. Mild for age atheromatous change about the right
bifurcation without hemodynamically significant stenosis. Right ICA
widely patent distally without stenosis, dissection or occlusion.

Left carotid system: Left CCA widely patent from its origin to the
bifurcation without stenosis. Mild for age atheromatous change about
the left bifurcation without significant stenosis. Left ICA widely
patent distally to the skull base without stenosis, dissection or
occlusion.

Vertebral arteries: Both vertebral arteries arise from the
subclavian arteries. No proximal subclavian artery stenosis.
Vertebral arteries are markedly diminutive in hypoplastic
bilaterally, but remain patent without stenosis, dissection or
occlusion.

Skeleton: No acute osseous abnormality. No discrete or worrisome
osseous lesions. Prominent degenerative changes noted about the C1-2
articulation.

Other neck: No other acute soft tissue abnormality within the neck.
1.5 cm well-circumscribed round cystic lesion within the
subcutaneous fat at the right submandibular region noted (series 4,
image 55), favored to reflect a sebaceous cyst. No adenopathy
elsewhere within the neck. 1.8 cm left thyroid nodule (series 4,
image 44). Few additional scattered subcentimeter nodules noted as
well.

Upper chest: Visualized upper chest demonstrates no acute finding.

Review of the MIP images confirms the above findings

CTA HEAD FINDINGS

Anterior circulation: Examination limited by motion artifact.

Both internal carotid arteries widely patent to the termini without
stenosis or other abnormality. A1 segments widely patent. Normal
anterior communicating artery complex. Anterior cerebral arteries
grossly patent to their distal aspects without appreciable stenosis.
No M1 stenosis or occlusion. Negative MCA bifurcations. No visible
proximal MCA branch occlusion. Distal MCA branches grossly perfused
and symmetric.

Posterior circulation: Vertebral arteries patent to the
vertebrobasilar junction without stenosis. Left PICA patent the
right vertebral artery essentially primarily supplies a common right
PICA/AICA origin, immediately dividing from the vertebrobasilar
junction, and contributing little to the remainder of the posterior
circulation. The basilar artery is markedly diminutive in
hypoplastic distally, and not well seen, but grossly patent.
Superior cerebral arteries are perfused bilaterally. Fetal type
origin of both PCAs supplied via a robust bilateral posterior
communicating arteries. Both PCAs perfused to their distal aspects
without stenosis.

Venous sinuses: Patent.

Anatomic variants: Fetal type origin of the PCAs with markedly
diminutive vertebrobasilar system. No aneurysm.

Review of the MIP images confirms the above findings
IMPRESSION: 1. Negative CTA for emergent large vessel occlusion.
2. Mild for age atheromatous change about the carotid bifurcations
without hemodynamically significant stenosis.
3. Fetal type origin of the PCAs with secondary markedly diminutive
vertebrobasilar system.
4. 1.8 cm left thyroid nodule, indeterminate. Further evaluation
with dedicated thyroid ultrasound recommended. This could be
performed on a nonemergent outpatient basis. (ref: [HOSPITAL].
[DATE]): 143-50).

## 2020-09-07 IMAGING — CT CT HEAD W/O CM
3 series · 15 of 47 positions shown, 18 images · non-contrast
Comparison: None.

CLINICAL DATA: Dizziness for 1 hour with transient tingling in the
left fingers and lips. Stroke suspected.

EXAM:
CT HEAD WITHOUT CONTRAST
TECHNIQUE: Contiguous axial images were obtained from the base of the skull
through the vertex without intravenous contrast.

[Series 2: head w o · axial · 0.48mm/px · z∈[+137,+262]mm · 9 of 31 slices shown, 12 images]
[im 3/31  brain]
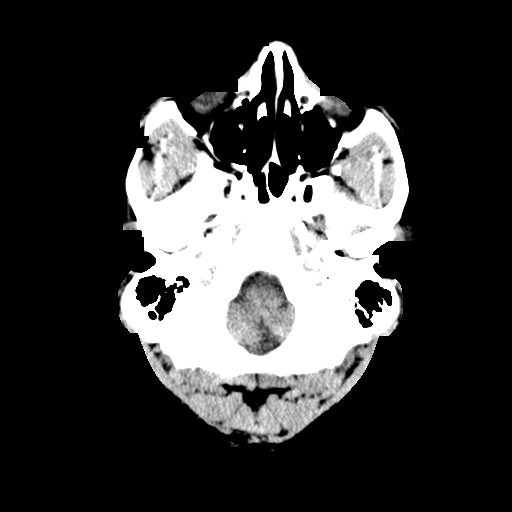
[im 3/31  bone]
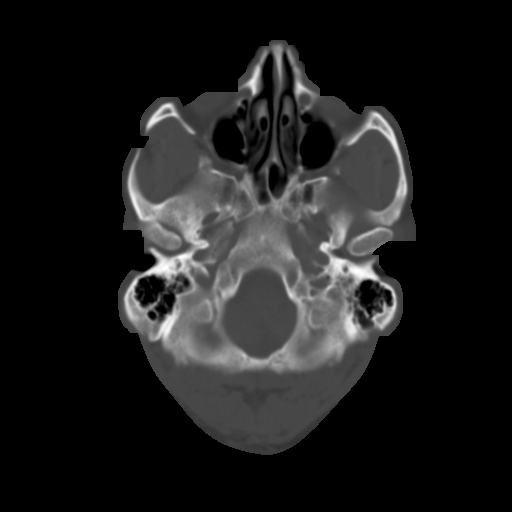
[im 6/31  brain]
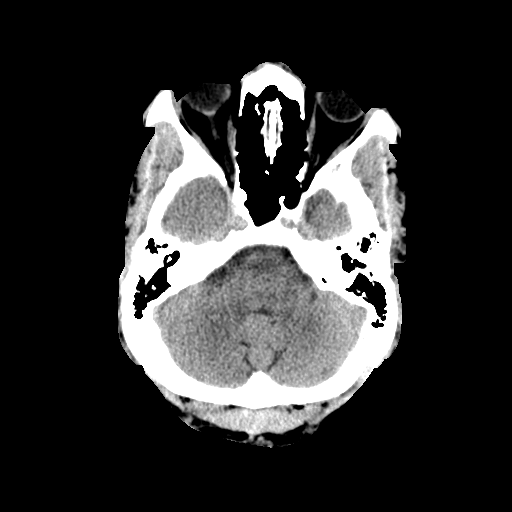
[im 9/31  brain]
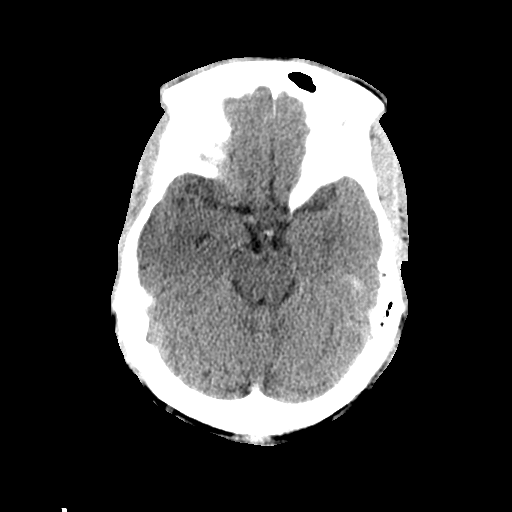
[im 12/31  brain]
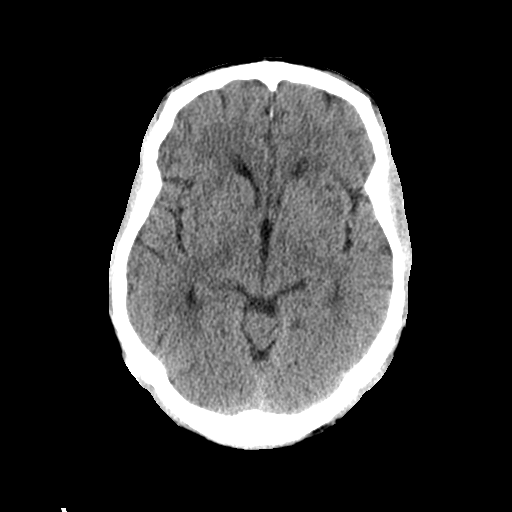
[im 16/31  brain]
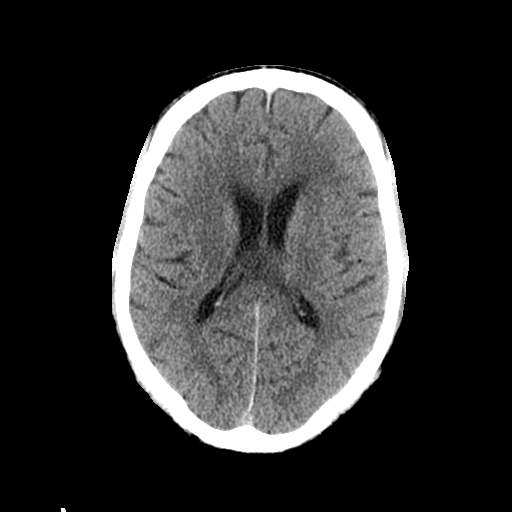
[im 16/31  bone]
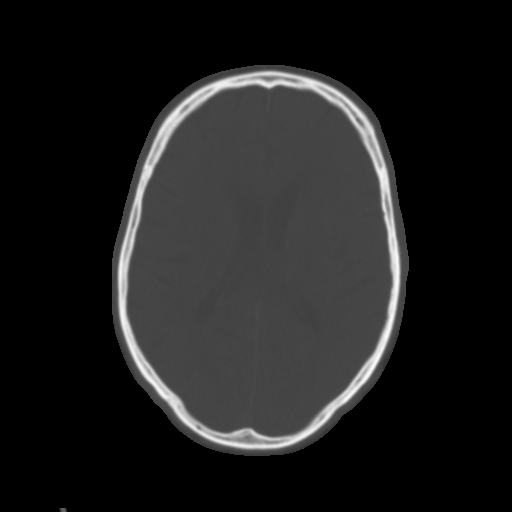
[im 19/31  brain]
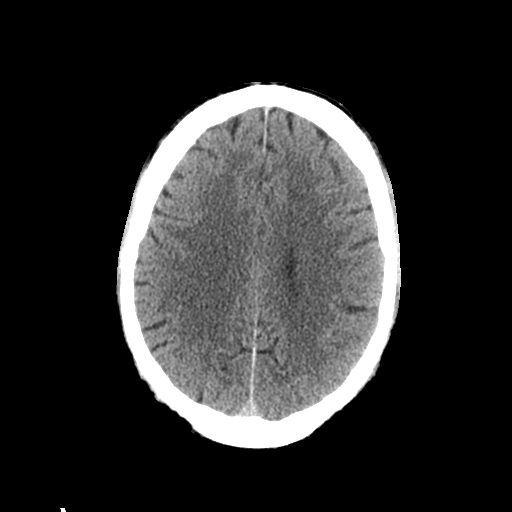
[im 22/31  brain]
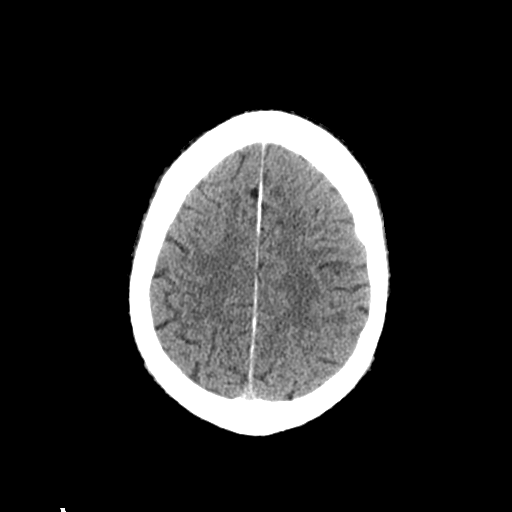
[im 25/31  brain]
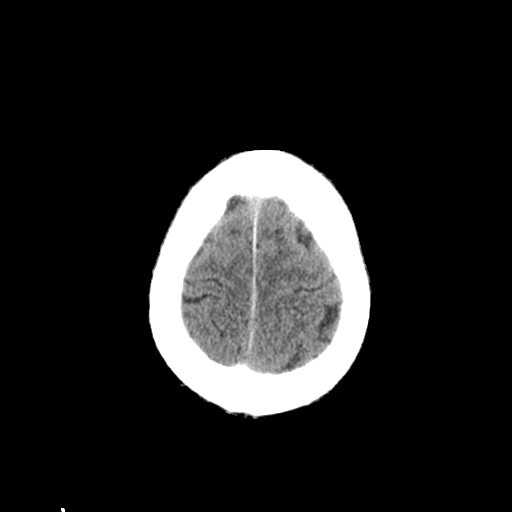
[im 28/31  brain]
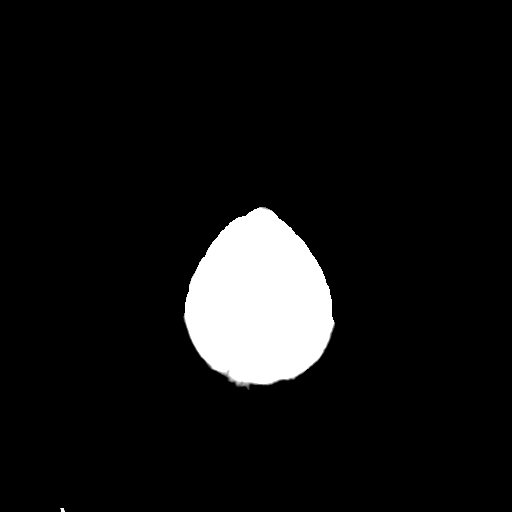
[im 28/31  bone]
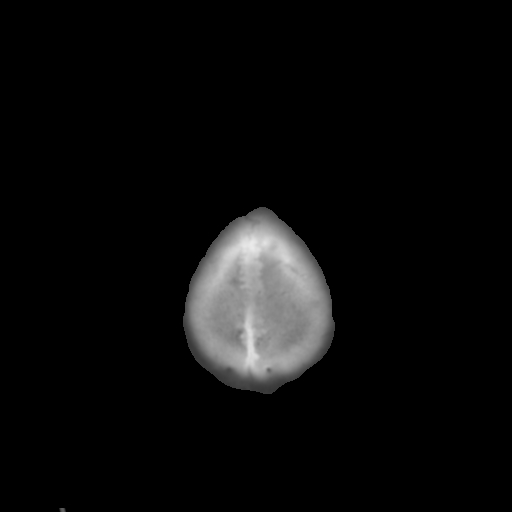

[Series 4: coronal soft · coronal · 0.32mm/px · 3 of 73 slices shown]
[im 25/73  brain]
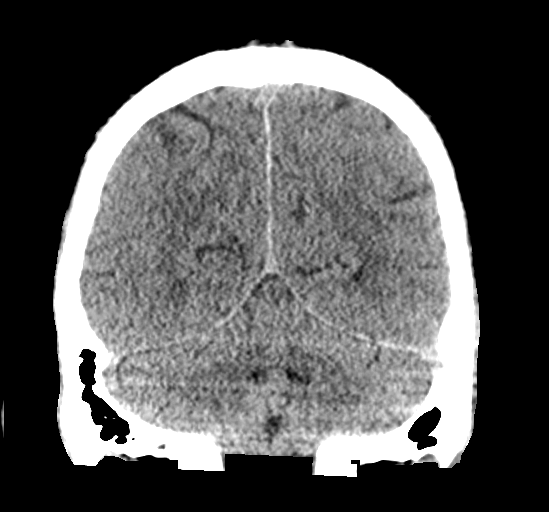
[im 33/73  brain]
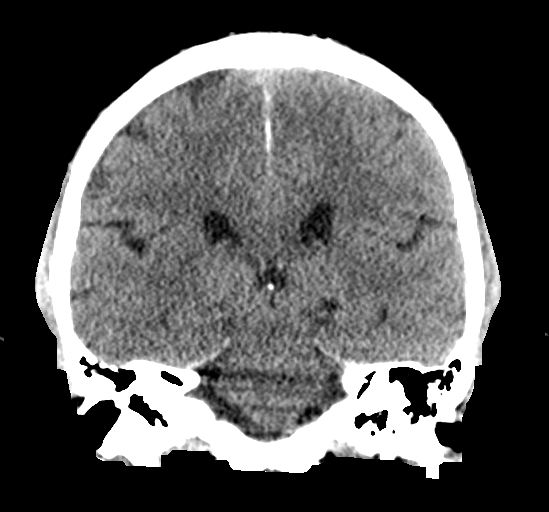
[im 41/73  brain]
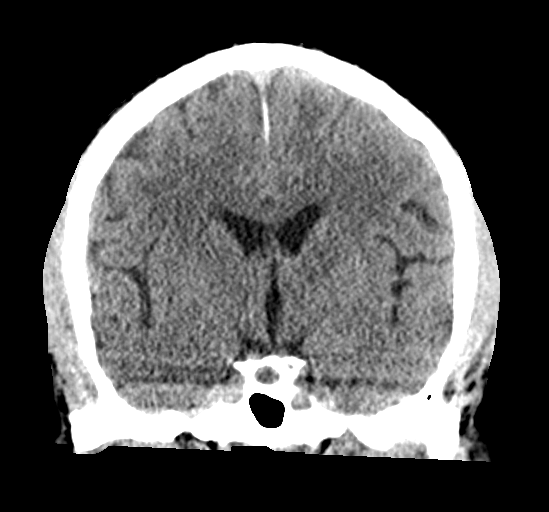

[Series 5: sagittal soft · sagittal · 0.33mm/px · 3 of 60 slices shown]
[im 20/60  brain]
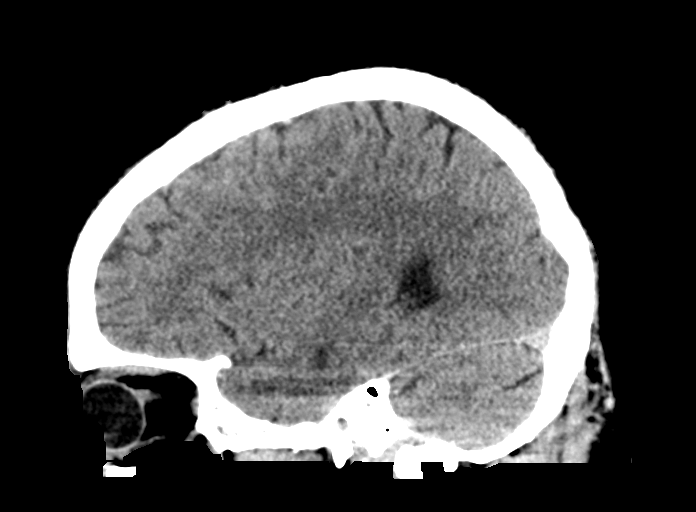
[im 30/60  brain]
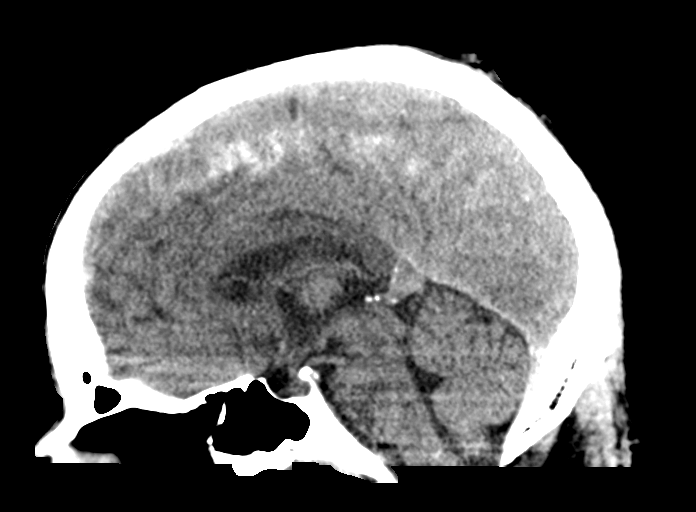
[im 40/60  brain]
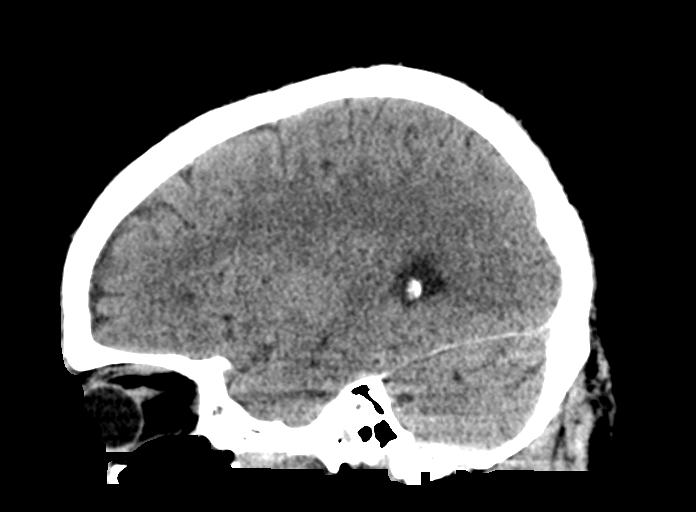

[15 of 47 positions shown; findings below may reference images not displayed]

FINDINGS: Brain: There is no evidence of acute intracranial hemorrhage, mass
lesion, brain edema or extra-axial fluid collection. The ventricles
and subarachnoid spaces are appropriately sized for age. There is no
CT evidence of acute cortical infarction.

Vascular:  No hyperdense vessel identified.

Skull: Negative for fracture or focal lesion.

Sinuses/Orbits: Mucous retention cyst or polyp medially in the right
maxillary sinus with partial ethmoid sinus opacification on the
right. The additional visualized paranasal sinuses, mastoid air
cells and middle ears are clear. No orbital abnormalities.

Other: None.
IMPRESSION: 1. No acute intracranial findings.
2. Mild right maxillary and ethmoid sinus disease as described.

## 2020-09-07 IMAGING — CT CT ANGIO HEAD-NECK
2 of 7 series · 8 of 33 positions shown · IV contrast (Omnipaque or Isovue)
Comparison: Prior head CT from earlier the same day.

CLINICAL DATA: Initial evaluation for acute neuro deficit, stroke
suspected. Acute dizziness for 1 hour with left-sided tingling.

EXAM:
CT ANGIOGRAPHY HEAD AND NECK
TECHNIQUE: Multidetector CT imaging of the head and neck was performed using
the standard protocol during bolus administration of intravenous
contrast. Multiplanar CT image reconstructions and MIPs were
obtained to evaluate the vascular anatomy. Carotid stenosis
measurements (when applicable) are obtained utilizing NASCET
criteria, using the distal internal carotid diameter as the
denominator.
CONTRAST:  75mL OMNIPAQUE IOHEXOL 350 MG/ML SOLN

[Series 4: cta head & neck · axial · 0.66mm/px · z∈[+34,+154]mm · 2 of 181 slices shown]
[im 61/181  soft-tissue]
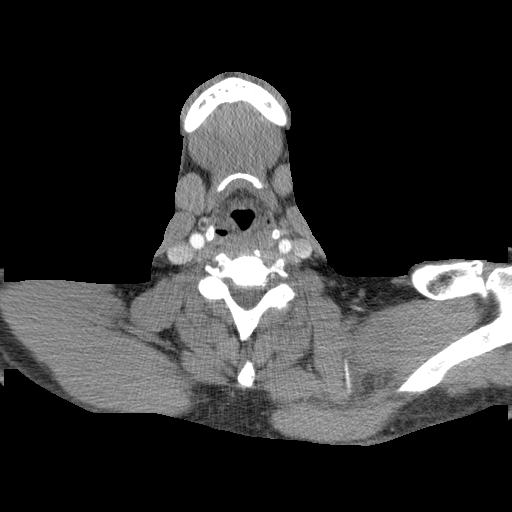
[im 121/181  soft-tissue]
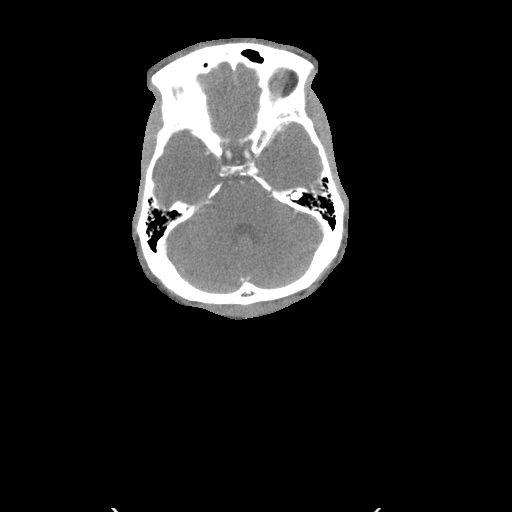

[Series 6: ax thins · axial · 0.43mm/px · z∈[-74,+197]mm · 6 of 384 slices shown]
[im 55/384  soft-tissue]
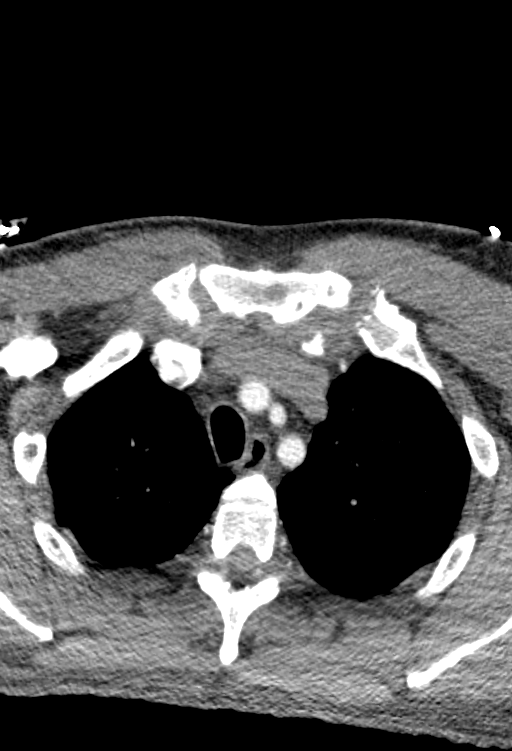
[im 110/384  bone]
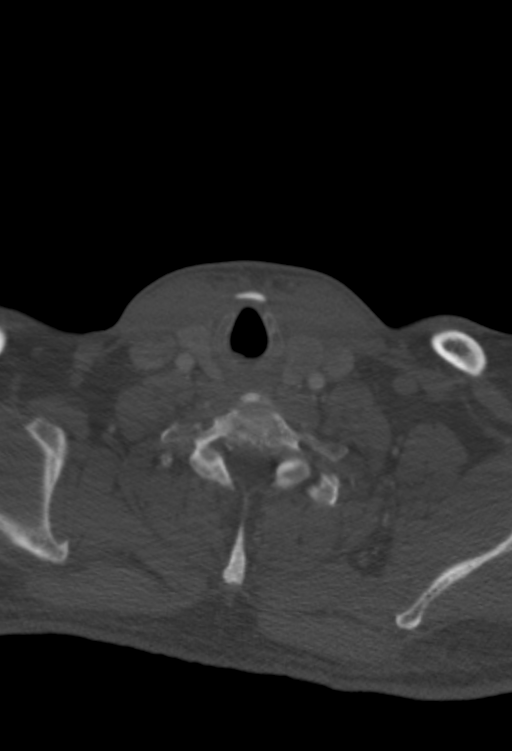
[im 165/384  soft-tissue]
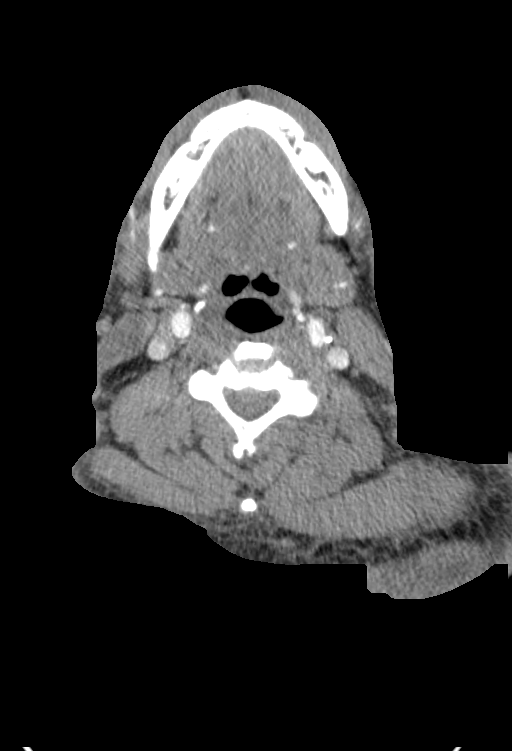
[im 219/384  bone]
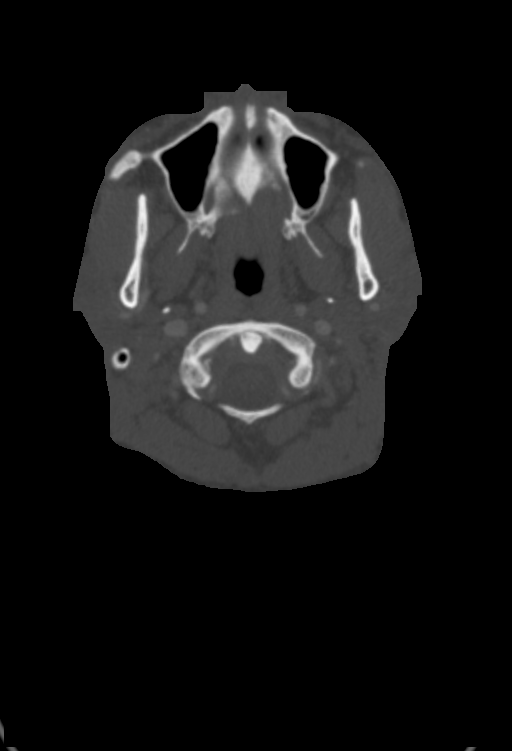
[im 274/384  soft-tissue]
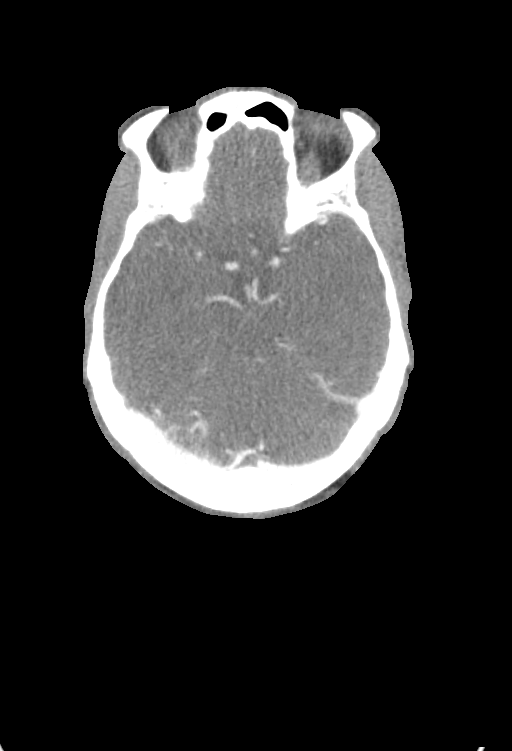
[im 329/384  bone]
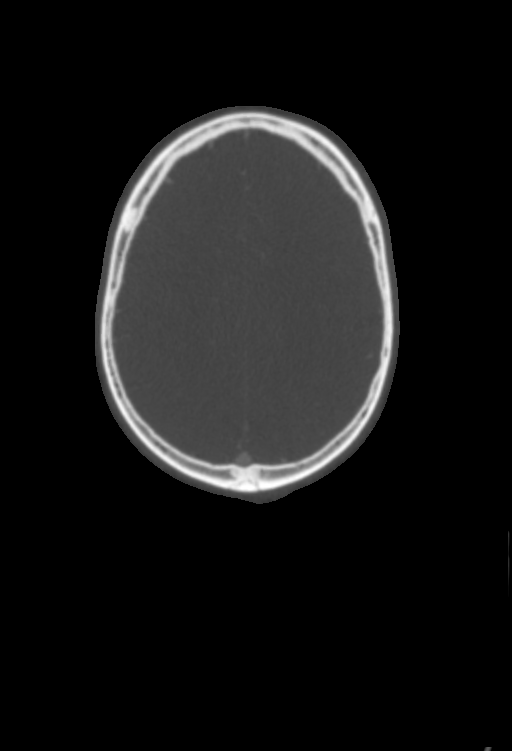

[8 of 33 positions shown; findings below may reference images not displayed]

FINDINGS: CTA NECK FINDINGS

Aortic arch: Visualized aortic arch of normal caliber with normal
branch pattern. No hemodynamically significant stenosis seen about
the origin of the great vessels.

Right carotid system: Right CCA widely patent from its origin to the
bifurcation. Mild for age atheromatous change about the right
bifurcation without hemodynamically significant stenosis. Right ICA
widely patent distally without stenosis, dissection or occlusion.

Left carotid system: Left CCA widely patent from its origin to the
bifurcation without stenosis. Mild for age atheromatous change about
the left bifurcation without significant stenosis. Left ICA widely
patent distally to the skull base without stenosis, dissection or
occlusion.

Vertebral arteries: Both vertebral arteries arise from the
subclavian arteries. No proximal subclavian artery stenosis.
Vertebral arteries are markedly diminutive in hypoplastic
bilaterally, but remain patent without stenosis, dissection or
occlusion.

Skeleton: No acute osseous abnormality. No discrete or worrisome
osseous lesions. Prominent degenerative changes noted about the C1-2
articulation.

Other neck: No other acute soft tissue abnormality within the neck.
1.5 cm well-circumscribed round cystic lesion within the
subcutaneous fat at the right submandibular region noted (series 4,
image 55), favored to reflect a sebaceous cyst. No adenopathy
elsewhere within the neck. 1.8 cm left thyroid nodule (series 4,
image 44). Few additional scattered subcentimeter nodules noted as
well.

Upper chest: Visualized upper chest demonstrates no acute finding.

Review of the MIP images confirms the above findings

CTA HEAD FINDINGS

Anterior circulation: Examination limited by motion artifact.

Both internal carotid arteries widely patent to the termini without
stenosis or other abnormality. A1 segments widely patent. Normal
anterior communicating artery complex. Anterior cerebral arteries
grossly patent to their distal aspects without appreciable stenosis.
No M1 stenosis or occlusion. Negative MCA bifurcations. No visible
proximal MCA branch occlusion. Distal MCA branches grossly perfused
and symmetric.

Posterior circulation: Vertebral arteries patent to the
vertebrobasilar junction without stenosis. Left PICA patent the
right vertebral artery essentially primarily supplies a common right
PICA/AICA origin, immediately dividing from the vertebrobasilar
junction, and contributing little to the remainder of the posterior
circulation. The basilar artery is markedly diminutive in
hypoplastic distally, and not well seen, but grossly patent.
Superior cerebral arteries are perfused bilaterally. Fetal type
origin of both PCAs supplied via a robust bilateral posterior
communicating arteries. Both PCAs perfused to their distal aspects
without stenosis.

Venous sinuses: Patent.

Anatomic variants: Fetal type origin of the PCAs with markedly
diminutive vertebrobasilar system. No aneurysm.

Review of the MIP images confirms the above findings
IMPRESSION: 1. Negative CTA for emergent large vessel occlusion.
2. Mild for age atheromatous change about the carotid bifurcations
without hemodynamically significant stenosis.
3. Fetal type origin of the PCAs with secondary markedly diminutive
vertebrobasilar system.
4. 1.8 cm left thyroid nodule, indeterminate. Further evaluation
with dedicated thyroid ultrasound recommended. This could be
performed on a nonemergent outpatient basis. (ref: [HOSPITAL].
[DATE]): 143-50).

## 2020-09-07 MED ORDER — IOHEXOL 350 MG/ML SOLN
100.0000 mL | Freq: Once | INTRAVENOUS | Status: AC | PRN
  Administered 2020-09-07: 75 mL via INTRAVENOUS

## 2020-09-07 NOTE — ED Provider Notes (Signed)
[facility] EMERGENCY DEPARTMENT Provider Note   CSN: [id number] Arrival date & time: 09/07/20  1621     History Chief Complaint  Patient presents with  . Dizziness    Matthew Wise is a 59 y.o. male.  HPI Patient has had some dizziness today.  States he felt as if he was moving around her trunk.  Also partway through that developed tingling in his left fingers and lips.  Also states he had double vision vertically when he looks to the left.  Dizziness is improved but not completely resolved.  States the numbness and tingling and double vision are resolved.  Patient is supposed be on anticoagulation for previous clots, however has been noncompliant.    Past Medical History:  Diagnosis Date  . Chronic anticoagulation 11/14/2014  . CKD (chronic kidney disease) stage 3, GFR 30-59 ml/min (HCC) 10/24/2019  . CKD (chronic kidney disease) stage 3, GFR 30-59 ml/min (HCC) 10/24/2019  . Clotting disorder ([place])   . 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 DVT (deep venous thrombosis) ([place]) 06/15/2011  . DVT of leg (deep venous thrombosis) (HCC)    rt. leg  . H/O PE (pulmonary embolism) 06/15/2011  . 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 Prediabetes 10/24/2019    Patient Active Problem List   Diagnosis Date Noted  . Medicare annual wellness visit, subsequent 12/05/2019  . Nicotine dependence 12/05/2019  . CKD (chronic kidney disease) stage 3, GFR 30-59 ml/min (HCC) 10/24/2019  . Prediabetes 10/24/2019  . Pedal edema 09/14/2017  . Tubular adenoma of colon 09/14/2017  . Chronic anticoagulation 11/14/2014  . Sickle cell trait ([place]) 08/29/2012  . 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 DVT (deep venous thrombosis) ([place]) 06/15/2011  . Pulmonary embolism ([place]) 06/15/2011  . 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 COLONOSCOPY WITH PROPOFOL N/A 10/29/2015   Procedure: COLONOSCOPY WITH PROPOFOL;  Surgeon: Danie Binder, MD;  Location: [facility];  Service: Endoscopy;  Laterality: N/A;  930  . None to date  10/07/15       Family History  Problem Relation Age of Onset  . COPD Father   . Stroke Brother   . Pulmonary embolism Sister   . Throat cancer Brother   . Arthritis Mother   . Diabetes Mother   . Stroke Mother   . Hypertension Brother   . Diabetes Brother   . Arthritis Brother   . Diabetes Sister   . Colon cancer Neg Hx     Social History   Tobacco Use  . Smoking status: Current Every Day Smoker    Packs/day: 1.00    Years: 30.00    Pack years: 30.00    Types: Cigarettes    Start date: 12/01/1983  . Smokeless tobacco: Never Used  . Tobacco comment: counseled  Vaping Use  . Vaping Use: Never used  Substance Use Topics  . Alcohol use: Yes    Alcohol/week: 3.0 standard drinks    Types: 3 Cans of beer per week    Comment: 2-3 beers a day  . Drug use: Not Currently    Comment: REMOTE HISTORY, Quit in 2007    Home Medications Prior to Admission medications   Medication Sig Start Date End Date Taking? Authorizing Provider  amLODipine (NORVASC) 2.5 MG tablet  Take 1 tablet (2.5 mg total) by mouth daily. 01/30/20   Ailene Ards, NP  rivaroxaban (XARELTO) 20 MG TABS tablet Take 1 tablet (20 mg total) by mouth daily with supper. 01/30/20   Ailene Ards, NP    Allergies    Patient has no known allergies.  Review of Systems   Review of Systems  Constitutional: Negative for appetite change.  Eyes: Positive for visual disturbance.  Respiratory: Negative for shortness of breath.   Neurological: Positive for dizziness and numbness. Negative for speech difficulty and headaches.    Physical Exam Updated Vital Signs BP (!) 184/107   Pulse (!) 53   Temp 98.8 F (37.1 C) (Oral)   Resp 20   Ht 6' (1.829 m)   Wt 124.7 kg   SpO2  100%   BMI 37.30 kg/m   Physical Exam Vitals and nursing note reviewed.  Constitutional:      Appearance: Normal appearance.  HENT:     Head: Normocephalic.  Eyes:     Extraocular Movements: Extraocular movements intact.     Pupils: Pupils are equal, round, and reactive to light.  Cardiovascular:     Rate and Rhythm: Regular rhythm.  Pulmonary:     Effort: Pulmonary effort is normal.     Breath sounds: Normal breath sounds.  Abdominal:     Tenderness: There is no abdominal tenderness.     Hernia: No hernia is present.  Musculoskeletal:        General: No tenderness.     Cervical back: Neck supple.  Skin:    General: Skin is warm.     Capillary Refill: Capillary refill takes 2 to 3 seconds.  Neurological:     Mental Status: He is alert and oriented to person, place, and time.     Comments: Eye movements intact.  No diplopia.  No more double vision.  Finger-nose intact.  Sensation intact both upper extremities.  Normal speech.     ED Results / Procedures / Treatments   Labs (all labs ordered are listed, but only abnormal results are displayed) Labs Reviewed  COMPREHENSIVE METABOLIC PANEL - Abnormal; Notable for the following components:      Result Value   Creatinine, Ser 1.65 (*)    GFR, Estimated 45 (*)    All other components within normal limits  CBG MONITORING, ED - Abnormal; Notable for the following components:   Glucose-Capillary 103 (*)    All other components within normal limits  TROPONIN I (HIGH SENSITIVITY) - Abnormal; Notable for the following components:   Troponin I (High Sensitivity) 46 (*)    All other components within normal limits  RESPIRATORY PANEL BY RT PCR (FLU A&B, COVID)  CBC WITH DIFFERENTIAL/PLATELET  URINALYSIS, ROUTINE W REFLEX MICROSCOPIC  PROTIME-INR  TROPONIN I (HIGH SENSITIVITY)    EKG EKG Interpretation  Date/Time:  Saturday September 07 2020 17:12:02 EDT Ventricular Rate:  55 PR Interval:  136 QRS Duration: 87 QT  Interval:  477 QTC Calculation: 457 R Axis:   52 Text Interpretation: Sinus rhythm Atrial premature complex Nonspecific T abnormalities, inferior leads No significant change since last tracing Confirmed by Davonna Belling [id number]) on 09/07/2020 7:01:28 PM   Radiology CT Head Wo Contrast  Result Date: 09/07/2020 CLINICAL DATA:  Dizziness for 1 hour with transient tingling in the left fingers and lips. Stroke suspected. EXAM: CT HEAD WITHOUT CONTRAST TECHNIQUE: Contiguous axial images were obtained from the base of the skull through the vertex without intravenous contrast. COMPARISON:  None. FINDINGS: Brain: There is no evidence of acute intracranial hemorrhage, mass lesion, brain edema or extra-axial fluid collection. The ventricles and subarachnoid spaces are appropriately sized for age. There is no CT evidence of acute cortical infarction. Vascular:  No hyperdense vessel identified. Skull: Negative for fracture or focal lesion. Sinuses/Orbits: Mucous retention cyst or polyp medially in the right maxillary sinus with partial ethmoid sinus opacification on the right. The additional visualized paranasal sinuses, mastoid air cells and middle ears are clear. No orbital abnormalities. Other: None. IMPRESSION: 1. No acute intracranial findings. 2. Mild right maxillary and ethmoid sinus disease as described. Electronically Signed   By: Richardean Sale M.D.   On: 09/07/2020 18:03    Procedures Procedures (including critical care time)  Medications Ordered in ED Medications - No data to display  ED Course  I have reviewed the triage vital signs and the nursing notes.  Pertinent labs & imaging results that were available during my care of the patient were reviewed by me and considered in my medical decision making (see chart for details).    MDM Rules/Calculators/A&P                         Patient with dizziness.  Also had tingling to his left fingers and lips.  States he had little difficulty using  the left hand.  States the dizziness was somewhat severe.  Is somewhat hypertensive here.  No chest pain however.  Does have a history of hypercoagulability and has been off his medications.  With a hypercoagulable history dizziness and other focal findings such as the numbness is worried this could be stroke.  Head CT reassuring.  However I think patient benefit from [place] to the hospital.  Troponin also mildly elevated.  EKG reassuring.  May be due to his blood pressure.  Doubt this is acute coronary event.  Will admit to hospitalist.  Final Clinical Impression(s) / ED Diagnoses Final diagnoses:  Dizziness    Rx / DC Orders ED Discharge Orders    None       Davonna Belling, MD 09/07/20 2046

## 2020-09-07 NOTE — ED Triage Notes (Signed)
Pt c/o dizziness x 1 hour ago with tingling to his left fingers and his lips; pt reports no longer feels tingling but still feels dizzy

## 2020-09-07 NOTE — ED Notes (Signed)
ED Provider at bedside. 

## 2020-09-07 NOTE — ED Notes (Signed)
Patient transported to CT 

## 2020-09-07 NOTE — H&P (Signed)
History and Physical  Matthew Wise PYP:[id number] DOB: 06/07/1961 DOA: 09/07/2020  Referring physician: Heide Scales PCP: Doree Albee, MD  Patient coming from: Home  Chief Complaint:   HPI: Matthew Wise is a 59 y.o. male with medical history significant for hypertension, bilateral lower extremity DVT and PE who presents to the emergency department due to sudden onset of dizziness which occurred around 1-2 PM today.  Patient states that when he got up to go use restroom, he felt dizzy and this was associated with blurred vision when he looks to his left, he also complained of numbness and tingling in his left fingers.  Patient states that he went back to the living room and relax on the recliner hoping that symptoms will self resolve.  Numbness and tingling resolved after about 15 to 20 minutes, but the dizziness and blurred vision lasted few hours and did not resolve until after he arrived at the ED.  He was supposed to be on Xarelto due to the PE and DVT, but patient has not been compliant with this.  Patient also denies having history of hypertension and ever being on any antihypertensive medication, but med rec showed that patient was on amlodipine at home.  He denies chest pain, shortness of breath, headache, nausea or vomiting.  ED Course:  In the emergency department, BP was elevated at 165/109, otherwise he was hemodynamically stable.  Work-up in the ED showed normal CBC and creatinine of 1.65 (this was 2.07 about 10 months ago).  Troponin x2, 46 > 57, urinalysis was negative.  SARS coronavirus 2 was negative.  CT head without contrast showed no acute intracranial findings.  Hospitalist was asked to admit patient for further evaluation management.  Review of Systems: Constitutional: Negative for chills and fever.  HENT: Negative for ear pain and sore throat.   Eyes: Positive for visual disturbance.  Negative for pain  Respiratory: Negative for cough, chest tightness and  shortness of breath.   Cardiovascular: Negative for chest pain and palpitations.  Gastrointestinal: Negative for abdominal pain and vomiting.  Endocrine: Negative for polyphagia and polyuria.  Genitourinary: Negative for decreased urine volume, dysuria Musculoskeletal: Negative for arthralgias and back pain.  Skin: Negative for color change and rash.  Allergic/Immunologic: Negative for immunocompromised state.  Neurological: Positive for dizziness, numbness and tingling of left fingers and lips.  Positive for left-sided blurry vision.  Negative for tremors, syncope, speech difficulty and headaches.  Hematological: Does not bruise/bleed easily.  All other systems reviewed and are negative    Past Medical History:  Diagnosis Date  . Chronic anticoagulation 11/14/2014  . CKD (chronic kidney disease) stage 3, GFR 30-59 ml/min (HCC) 10/24/2019  . CKD (chronic kidney disease) stage 3, GFR 30-59 ml/min (HCC) 10/24/2019  . Clotting disorder ([place])   . 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 DVT (deep venous thrombosis) ([place]) 06/15/2011  . DVT of leg (deep venous thrombosis) (HCC)    rt. leg  . H/O PE (pulmonary embolism) 06/15/2011  . 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 Prediabetes 10/24/2019   Past Surgical History:  Procedure Laterality Date  . COLONOSCOPY WITH PROPOFOL N/A 10/29/2015   Procedure: COLONOSCOPY WITH PROPOFOL;  Surgeon: Danie Binder, MD;  Location: [facility];  Service: Endoscopy;  Laterality: N/A;  930  . None to date  10/07/15    Social History:  reports that he  has been smoking cigarettes. He started smoking about 36 years ago. He has a 30.00 pack-year smoking history. He has never used smokeless tobacco. He reports current alcohol use of about 3.0 standard drinks of alcohol per week. He reports previous drug use.   No Known Allergies  Family History  Problem  Relation Age of Onset  . COPD Father   . Stroke Brother   . Pulmonary embolism Sister   . Throat cancer Brother   . Arthritis Mother   . Diabetes Mother   . Stroke Mother   . Hypertension Brother   . Diabetes Brother   . Arthritis Brother   . Diabetes Sister   . Colon cancer Neg Hx      Prior to Admission medications   Medication Sig Start Date End Date Taking? Authorizing Provider  amLODipine (NORVASC) 2.5 MG tablet Take 1 tablet (2.5 mg total) by mouth daily. 01/30/20   Ailene Ards, NP  rivaroxaban (XARELTO) 20 MG TABS tablet Take 1 tablet (20 mg total) by mouth daily with supper. 01/30/20   Ailene Ards, NP    Physical Exam: BP (!) 184/107   Pulse (!) 53   Temp 98.8 F (37.1 C) (Oral)   Resp 20   Ht 6' (1.829 m)   Wt 124.7 kg   SpO2 100%   BMI 37.30 kg/m   . General: 59 y.o. year-old male well developed well nourished in no acute distress.  Alert and oriented x3. Marland Kitchen HEENT: NCAT, EOMI . Neck: Supple, trachea media . Cardiovascular: Regular rate and rhythm with no rubs or gallops.  No thyromegaly or JVD noted.  2/4 pulses in all 4 extremities. Marland Kitchen Respiratory: Clear to auscultation with no wheezes or rales. Good inspiratory effort. . Abdomen: Soft nontender nondistended with normal bowel sounds x4 quadrants. . Muskuloskeletal: LLE old wound scar noted.  No cyanosis, clubbing or edema noted bilaterally . Neuro: CN II-XII intact, strength, sensation, reflexes, NIHSS 0 . Skin: No ulcerative lesions noted or rashes . Psychiatry: Judgement and insight appear normal. Mood is appropriate for condition and setting          Labs on Admission:  Basic Metabolic Panel: Recent Labs  Lab 09/07/20 1811  NA 138  K 4.3  CL 104  CO2 25  GLUCOSE 92  BUN 20  CREATININE 1.65*  CALCIUM 9.2   Liver Function Tests: Recent Labs  Lab 09/07/20 1811  AST 21  ALT 20  ALKPHOS 70  BILITOT 1.2  PROT 7.6  ALBUMIN 4.0   No results for input(s): LIPASE, AMYLASE in the last 168  hours. No results for input(s): AMMONIA in the last 168 hours. CBC: Recent Labs  Lab 09/07/20 1811  WBC 4.9  NEUTROABS 2.7  HGB 15.3  HCT 44.3  MCV 87.0  PLT 190   Cardiac Enzymes: No results for input(s): CKTOTAL, CKMB, CKMBINDEX, TROPONINI in the last 168 hours.  BNP (last 3 results) No results for input(s): BNP in the last 8760 hours.  ProBNP (last 3 results) No results for input(s): PROBNP in the last 8760 hours.  CBG: Recent Labs  Lab 09/07/20 1718  GLUCAP 103*    Radiological Exams on Admission: CT Head Wo Contrast  Result Date: 09/07/2020 CLINICAL DATA:  Dizziness for 1 hour with transient tingling in the left fingers and lips. Stroke suspected. EXAM: CT HEAD WITHOUT CONTRAST TECHNIQUE: Contiguous axial images were obtained from the base of the skull through the vertex without intravenous contrast. COMPARISON:  None. FINDINGS:  Brain: There is no evidence of acute intracranial hemorrhage, mass lesion, brain edema or extra-axial fluid collection. The ventricles and subarachnoid spaces are appropriately sized for age. There is no CT evidence of acute cortical infarction. Vascular:  No hyperdense vessel identified. Skull: Negative for fracture or focal lesion. Sinuses/Orbits: Mucous retention cyst or polyp medially in the right maxillary sinus with partial ethmoid sinus opacification on the right. The additional visualized paranasal sinuses, mastoid air cells and middle ears are clear. No orbital abnormalities. Other: None. IMPRESSION: 1. No acute intracranial findings. 2. Mild right maxillary and ethmoid sinus disease as described. Electronically Signed   By: Richardean Sale M.D.   On: 09/07/2020 18:03    EKG: I independently viewed the EKG done and my findings are as followed: Sinus bradycardia at rate of 55 bpm with T wave inversion in leads III and aVF (no change from EKG from 10/23/2015)  Assessment/Plan Present on Admission: . Transient ischemic attack . Pulmonary  embolism ([place]) . DVT (deep venous thrombosis) ([place]) . CKD (chronic kidney disease) stage 3, GFR 30-59 ml/min (HCC)  Principal Problem:   Transient ischemic attack Active Problems:   DVT (deep venous thrombosis) (HCC)   Pulmonary embolism (HCC)   CKD (chronic kidney disease) stage 3, GFR 30-59 ml/min (HCC)   History of hypertension   Elevated troponin   Dizziness   Numbness and tingling   H/O medication noncompliance  Dizziness, numbness and tingling, R/O TIA vs CVA Symptoms have since resolved and patient is back to baseline at this time CT of head without contrast showed no acute intracranial findings Patient will be admitted to telemetry unit  CT angiography of head and neck with contrast will be done in the morning Echocardiogram in the morning MRI of brain without contrast in the morning Continue fall precautions and neuro checks Lipid panel and hemoglobin A1c will be checked Continue PT/ST/OT eval and treat Bedside swallow eval by nursing prior to diet Tele neurology at [facility] was consulted regarding patient and will see patient on arrival to [facility] per ED physician   DVT, PE Patient has not been compliant with Xarelto This is temporarily held due to strokelike symptoms  Elevated troponin possibly secondary to type II demand ischemia Troponin 46 > 57, patient denies chest pain No changes noted from prior EKG Continue to trend troponin  History of hypertension Patient denies history of hypertension, though amlodipine 2.5 mg x 1 daily was noted in his med rec BP was elevated at 165/109 on arrival to the ED Permissive hypertension will be allowed at this time with plan to only treat BP when it's > 220/120  CKD stage III Stable; renally adjust medications, avoid nephrotoxic agents/dehydration/hypotension  History of medication noncompliance Importance of medication compliance was discussed with patient, and he verbalized understanding the discussion.  Obesity (BMI  37.30) Patient was counseled on diet and exercise Patient will need to follow-up with outpatient PCP for weight loss program  DVT prophylaxis: None at this time (SCD temporarily held due to history of bilateral lower extremity DVT, chemoprophylaxis held temporarily due to strokelike symptoms).  Code Status: Full code  Family Communication: None at bedside  Disposition Plan:  Patient is from:                        home Anticipated DC to:                   home Anticipated DC date:  1 day Anticipated DC barriers:         Patient is unstable to be discharged at this time due to strokelike symptoms that require further stroke work-up and neurology consult with further recommendation  Consults called: Neurology  Admission status: Observation    Bernadette Hoit MD [facility] Pager [phone]  If 7PM-7AM, please contact night-coverage [email or web address] Password [email or web address]  09/07/2020, 10:14 PM

## 2020-09-08 ENCOUNTER — Observation Stay (HOSPITAL_BASED_OUTPATIENT_CLINIC_OR_DEPARTMENT_OTHER): Payer: Medicare Other

## 2020-09-08 ENCOUNTER — Other Ambulatory Visit: Payer: Self-pay

## 2020-09-08 DIAGNOSIS — G459 Transient cerebral ischemic attack, unspecified: Secondary | ICD-10-CM | POA: Diagnosis not present

## 2020-09-08 DIAGNOSIS — I6389 Other cerebral infarction: Secondary | ICD-10-CM

## 2020-09-08 LAB — HEMOGLOBIN A1C
Hgb A1c MFr Bld: 5.3 % (ref 4.8–5.6)
Mean Plasma Glucose: 105.41 mg/dL

## 2020-09-08 LAB — CBC
HCT: 42.7 % (ref 39.0–52.0)
Hemoglobin: 14.7 g/dL (ref 13.0–17.0)
MCH: 29.6 pg (ref 26.0–34.0)
MCHC: 34.4 g/dL (ref 30.0–36.0)
MCV: 86.1 fL (ref 80.0–100.0)
Platelets: 178 10*3/uL (ref 150–400)
RBC: 4.96 MIL/uL (ref 4.22–5.81)
RDW: 14.4 % (ref 11.5–15.5)
WBC: 4.8 10*3/uL (ref 4.0–10.5)
nRBC: 0 % (ref 0.0–0.2)

## 2020-09-08 LAB — COMPREHENSIVE METABOLIC PANEL WITH GFR
ALT: 20 U/L (ref 0–44)
AST: 22 U/L (ref 15–41)
Albumin: 3.7 g/dL (ref 3.5–5.0)
Alkaline Phosphatase: 68 U/L (ref 38–126)
Anion gap: 9 (ref 5–15)
BUN: 21 mg/dL — ABNORMAL HIGH (ref 6–20)
CO2: 25 mmol/L (ref 22–32)
Calcium: 9.2 mg/dL (ref 8.9–10.3)
Chloride: 104 mmol/L (ref 98–111)
Creatinine, Ser: 1.54 mg/dL — ABNORMAL HIGH (ref 0.61–1.24)
GFR, Estimated: 49 mL/min — ABNORMAL LOW
Glucose, Bld: 87 mg/dL (ref 70–99)
Potassium: 3.6 mmol/L (ref 3.5–5.1)
Sodium: 138 mmol/L (ref 135–145)
Total Bilirubin: 1 mg/dL (ref 0.3–1.2)
Total Protein: 7.1 g/dL (ref 6.5–8.1)

## 2020-09-08 LAB — ECHOCARDIOGRAM COMPLETE
Area-P 1/2: 1.98 cm2
Height: 72 in
S' Lateral: 3.51 cm
Weight: 4400 oz

## 2020-09-08 LAB — PROTIME-INR
INR: 1 (ref 0.8–1.2)
Prothrombin Time: 13.2 s (ref 11.4–15.2)

## 2020-09-08 LAB — TROPONIN I (HIGH SENSITIVITY)
Troponin I (High Sensitivity): 60 ng/L — ABNORMAL HIGH
Troponin I (High Sensitivity): 62 ng/L — ABNORMAL HIGH (ref <18)

## 2020-09-08 LAB — LIPID PANEL
Cholesterol: 168 mg/dL (ref 0–200)
HDL: 66 mg/dL (ref >40)
LDL Cholesterol: 83 mg/dL (ref 0–99)
Total CHOL/HDL Ratio: 2.5 RATIO
Triglycerides: 96 mg/dL (ref <150)
VLDL: 19 mg/dL (ref 0–40)

## 2020-09-08 LAB — HIV ANTIBODY (ROUTINE TESTING W REFLEX): HIV Screen 4th Generation wRfx: NONREACTIVE

## 2020-09-08 LAB — PHOSPHORUS: Phosphorus: 3.7 mg/dL (ref 2.5–4.6)

## 2020-09-08 LAB — APTT: aPTT: 29 s (ref 24–36)

## 2020-09-08 LAB — MAGNESIUM: Magnesium: 2.3 mg/dL (ref 1.7–2.4)

## 2020-09-08 NOTE — ED Notes (Signed)
Report to [employer]

## 2020-09-08 NOTE — Evaluation (Signed)
Physical Therapy Evaluation Patient Details Name: Matthew Wise MRN: [id number] DOB: 05-02-1961 Today's Date: 09/08/2020   History of Present Illness  Matthew Wise is a 59 y.o. male with medical history significant for hypertension, bilateral lower extremity DVT and PE who presents to the emergency department due to sudden onset of dizziness which occurred around 1-2 PM today.  Patient states that when he got up to go use restroom, he felt dizzy and this was associated with blurred vision when he looks to his left, he also complained of numbness and tingling in his left fingers.  Patient states that he went back to the living room and relax on the recliner hoping that symptoms will self resolve.  Numbness and tingling resolved after about 15 to 20 minutes, but the dizziness and blurred vision lasted few hours and did not resolve until after he arrived at the ED.  He was supposed to be on Xarelto due to the PE and DVT, but patient has not been compliant with this.  Patient also denies having history of hypertension and ever being on any antihypertensive medication, but med rec showed that patient was on amlodipine at home.  He denies chest pain, shortness of breath, headache, nausea or vomiting.    Clinical Impression  Patient functioning near baseline for functional mobility and gait. Patient strength WFL and equal bilaterally today without changes in sensation. Patient completes bed mobility and transfers without assist or AD. Patient ambulates in room with slightly decreased cadence without loss of balance or falls. Patient returned to bed at end of session. Patient discharged to care of nursing for ambulation daily as tolerated for length of stay.      Follow Up Recommendations No PT follow up    Equipment Recommendations  None recommended by PT    Recommendations for Other Services       Precautions / Restrictions Precautions Precautions: None Restrictions Weight Bearing  Restrictions: No      Mobility  Bed Mobility Overal bed mobility: Modified Independent             General bed mobility comments: transition to seated EOB  Transfers Overall transfer level: Modified independent Equipment used: None             General transfer comment: transfer to standing without AD, slighlty slow  Ambulation/Gait Ambulation/Gait assistance: Modified independent (Device/Increase time) Gait Distance (Feet): 40 Feet Assistive device: None Gait Pattern/deviations: Decreased step length - right;Decreased step length - left;Decreased stride length     General Gait Details: without AD  Stairs            Wheelchair Mobility    Modified Rankin (Stroke Patients Only)       Balance Overall balance assessment: No apparent balance deficits (not formally assessed)                                           Pertinent Vitals/Pain Pain Assessment: No/denies pain    Home Living Family/patient expects to be discharged to:: Private residence Living Arrangements: Alone Available Help at Discharge: Family;Friend(s);Available PRN/intermittently Type of Home: House Home Access: Level entry     Home Layout: One level Home Equipment: Cane - single point      Prior Function Level of Independence: Independent         Comments: Patient states short distance community ambulation without AD and independent with  ADL     Hand Dominance        Extremity/Trunk Assessment   Upper Extremity Assessment Upper Extremity Assessment: Defer to OT evaluation    Lower Extremity Assessment Lower Extremity Assessment: Overall WFL for tasks assessed    Cervical / Trunk Assessment Cervical / Trunk Assessment: Normal  Communication      Cognition   Behavior During Therapy: WFL for tasks assessed/performed Overall Cognitive Status: Within Functional Limits for tasks assessed                                         General Comments      Exercises     Assessment/Plan    PT Assessment Patent does not need any further PT services  PT Problem List         PT Treatment Interventions      PT Goals (Current goals can be found in the Care Plan section)  Acute Rehab PT Goals Patient Stated Goal: Return home PT Goal Formulation: With patient Time For Goal Achievement: 09/08/20 Potential to Achieve Goals: Good    Frequency     Barriers to discharge        Co-evaluation               AM-PAC PT "6 Clicks" Mobility  Outcome Measure Help needed turning from your back to your side while in a flat bed without using bedrails?: None Help needed moving from lying on your back to sitting on the side of a flat bed without using bedrails?: None Help needed moving to and from a bed to a chair (including a wheelchair)?: None Help needed standing up from a chair using your arms (e.g., wheelchair or bedside chair)?: None Help needed to walk in hospital room?: None Help needed climbing 3-5 steps with a railing? : A Little 6 Click Score: 23    End of Session   Activity Tolerance: Patient tolerated treatment well Patient left: in bed;with call bell/phone within reach Nurse Communication: Mobility status PT Visit Diagnosis: Other abnormalities of gait and mobility (R26.89);Muscle weakness (generalized) (M62.81)    Time: 7544-9201 PT Time Calculation (min) (ACUTE ONLY): 7 min   Charges:   PT Evaluation $PT Eval Low Complexity: 1 Low         12:39 PM, 09/08/20 Mearl Latin PT, DPT Physical Therapist at [facility]

## 2020-09-08 NOTE — Care Management Obs Status (Signed)
[facility] NOTIFICATION   Patient Details  Name: Matthew Wise MRN: [id number] Date of Birth: 1961-07-28   Medicare Observation Status Notification Given:  Yes    Natasha Bence, LCSW 09/08/2020, 5:41 PM

## 2020-09-08 NOTE — ED Notes (Signed)
awaiting lunch   Awaiting bed assignment

## 2020-09-08 NOTE — ED Notes (Signed)
Bed assigned  Awaiting bed ready

## 2020-09-08 NOTE — Progress Notes (Signed)
Currently awaiting transfer to [facility]. Currently being seen by my colleague Dr. Shanda Howells at [facility].  Will evaluate and follow the patient once the patient arrives in [place].

## 2020-09-08 NOTE — ED Notes (Signed)
Awaiting lunch and   Bed assignment from bed control

## 2020-09-08 NOTE — ED Notes (Signed)
rm change to rm [id number]  Call for report   RN in pt room   She will call

## 2020-09-08 NOTE — ED Notes (Signed)
Pt out of bed to bathroom   He ambulates heel to toe  Moves ad lib

## 2020-09-08 NOTE — Progress Notes (Signed)
PROGRESS NOTE    Matthew Wise  LZJ:[id number] DOB: 1961-04-10 DOA: 09/07/2020 PCP: Doree Albee, MD   Brief Narrative:  This 59 years old male with medical history significant for hypertension, bilateral lower extremity DVT and PE with noncompliance who presents to the emergency department due to sudden onset of dizziness associated with blurred vision and numbness in his left hand fingers. Numbness and tingling resolved after about 15 to 20 minutes, but the dizziness and blurred vision lasted few hours and did not resolve until after he arrived at the ED.  CT head without contrast showed no acute intracranial findings.  Patient is admitted for dizziness and numbness, to rule out TIA versus CVA.  Patient is awaiting transfer to [facility] for MRI.  Assessment & Plan:   Principal Problem:   Transient ischemic attack Active Problems:   DVT (deep venous thrombosis) (HCC)   Pulmonary embolism (HCC)   CKD (chronic kidney disease) stage 3, GFR 30-59 ml/min (HCC)   History of hypertension   Elevated troponin   Dizziness   Numbness and tingling   H/O medication noncompliance  Dizziness / Numbness and tingling, R/O TIA vs CVA: Patient reports back to baseline , symptoms has resolved CT of head without contrast showed: no acute intracranial findings Admitted to telemetry unit  Obtain CT angiography of head and neck with contrast Echocardiogram : Left ventricular ejection fraction 55 to 60%.  No regional wall motion abnormalities. MRI of brain without contrast : awaiting transfer at [facility] Continue fall precautions and neuro checks Hb A1C 5.6, Lipid profile  : LDL 96 , LDL goal below 70 Continue PT/ST/OT eval and treat Bedside swallow eval by nursing prior to diet. Tele neurology at [facility] was consulted regarding patient and will see patient on arrival to [facility] per ED physician   Hx. Of  DVT /PE: Patient has not been compliant with Xarelto. This is temporarily held due to strokelike  symptoms.  Elevated troponin possibly secondary to type II demand ischemia: Troponin 46 > 57, 60> 62 patient denies chest pain. No changes noted from prior EKG. Continue to trend troponin  History of hypertension: Patient denies history of hypertension, though amlodipine 2.5 mg x 1 daily was noted in his med rec BP was elevated at 165/109 on arrival to the ED Permissive hypertension will be allowed at this time with plan to only treat BP when it's > 220/120  CKD stage III:  Stable; renally adjust medications, avoid nephrotoxic agents/dehydration/hypotension  History of medication noncompliance Importance of medication compliance was discussed with patient, and he verbalized understanding the discussion.  Obesity (BMI 37.30) Patient was counseled on diet and exercise Patient will need to follow-up with outpatient PCP for weight loss program  DVT prophylaxis: SCDs Code Status: Full Family Communication: No one at bed side. Disposition Plan:  Status is: Observation  The patient remains OBS appropriate and will d/c before 2 midnights.  Dispo: The patient is from: Home              Anticipated d/c is to: Home              Anticipated d/c date is: 1 day              Patient currently is not medically stable to d/c.   Consultants:   NONE  Procedures:  Antimicrobials:  Anti-infectives (From admission, onward)   None      Subjective: Patient was seen and examined at bedside.  Overnight events noted.  He reports symptoms has completely resolved.  He is awaiting MRI.  He report dizziness has resolved.  Objective: Vitals:   09/08/20 0730 09/08/20 1100 09/08/20 1130 09/08/20 1230  BP: 135/83 (!) 144/124 (!) 163/88   Pulse: (!) 51 (!) 47 (!) 49 (!) 54  Resp: 12 18 13 18   Temp:      TempSrc:      SpO2: 98% 100% 100% 100%  Weight:      Height:        Intake/Output Summary (Last 24 hours) at 09/08/2020 1415 Last data filed at 09/08/2020 0830 Gross per 24 hour    Intake --  Output 900 ml  Net -900 ml   Filed Weights   09/07/20 1650  Weight: 124.7 kg    Examination:  General exam: Appears calm and comfortable  Respiratory system: Clear to auscultation. Respiratory effort normal. Cardiovascular system: S1 & S2 heard, RRR. No JVD, murmurs, rubs, gallops or clicks. No pedal edema. Gastrointestinal system: Abdomen is nondistended, soft and nontender. No organomegaly or masses felt. Normal bowel sounds heard. Central nervous system: Alert and oriented. No focal neurological deficits. Extremities: Symmetric 5 x 5 power. Skin: No rashes, lesions or ulcers Psychiatry: Judgement and insight appear normal. Mood & affect appropriate.     Data Reviewed: I have personally reviewed following labs and imaging studies  CBC: Recent Labs  Lab 09/07/20 1811 09/08/20 0429  WBC 4.9 4.8  NEUTROABS 2.7  --   HGB 15.3 14.7  HCT 44.3 42.7  MCV 87.0 86.1  PLT 190 130   Basic Metabolic Panel: Recent Labs  Lab 09/07/20 1811 09/08/20 0429  NA 138 138  K 4.3 3.6  CL 104 104  CO2 25 25  GLUCOSE 92 87  BUN 20 21*  CREATININE 1.65* 1.54*  CALCIUM 9.2 9.2  MG  --  2.3  PHOS  --  3.7   GFR: Estimated Creatinine Clearance: 72.2 mL/min (A) (by C-G formula based on SCr of 1.54 mg/dL (H)). Liver Function Tests: Recent Labs  Lab 09/07/20 1811 09/08/20 0429  AST 21 22  ALT 20 20  ALKPHOS 70 68  BILITOT 1.2 1.0  PROT 7.6 7.1  ALBUMIN 4.0 3.7   No results for input(s): LIPASE, AMYLASE in the last 168 hours. No results for input(s): AMMONIA in the last 168 hours. Coagulation Profile: Recent Labs  Lab 09/07/20 1811 09/08/20 0429  INR 1.0 1.0   Cardiac Enzymes: No results for input(s): CKTOTAL, CKMB, CKMBINDEX, TROPONINI in the last 168 hours. BNP (last 3 results) No results for input(s): PROBNP in the last 8760 hours. HbA1C: Recent Labs    09/08/20 0429  HGBA1C 5.3   CBG: Recent Labs  Lab 09/07/20 1718  GLUCAP 103*   Lipid  Profile: Recent Labs    09/08/20 0429  CHOL 168  HDL 66  LDLCALC 83  TRIG 96  CHOLHDL 2.5   Thyroid Function Tests: No results for input(s): TSH, T4TOTAL, FREET4, T3FREE, THYROIDAB in the last 72 hours. Anemia Panel: No results for input(s): VITAMINB12, FOLATE, FERRITIN, TIBC, IRON, RETICCTPCT in the last 72 hours. Sepsis Labs: No results for input(s): PROCALCITON, LATICACIDVEN in the last 168 hours.  Recent Results (from the past 240 hour(s))  Respiratory Panel by RT PCR (Flu A&B, Covid) - Nasopharyngeal Swab     Status: None   Collection Time: 09/07/20  7:03 PM   Specimen: Nasopharyngeal Swab  Result Value Ref Range Status   SARS Coronavirus 2 by RT PCR NEGATIVE NEGATIVE  Final    Comment: (NOTE) SARS-CoV-2 target nucleic acids are NOT DETECTED.  The SARS-CoV-2 RNA is generally detectable in upper respiratoy specimens during the acute phase of infection. The lowest concentration of SARS-CoV-2 viral copies this assay can detect is 131 copies/mL. A negative result does not preclude SARS-Cov-2 infection and should not be used as the sole basis for treatment or other patient management decisions. A negative result may occur with  improper specimen collection/handling, submission of specimen other than nasopharyngeal swab, presence of viral mutation(s) within the areas targeted by this assay, and inadequate number of viral copies (<131 copies/mL). A negative result must be combined with clinical observations, patient history, and epidemiological information. The expected result is Negative.  Fact Sheet for Patients:  PinkCheek.be  Fact Sheet for Healthcare Providers:  GravelBags.it  This test is no t yet approved or cleared by the [place] FDA and  has been authorized for detection and/or diagnosis of SARS-CoV-2 by FDA under an Emergency Use Authorization (EUA). This EUA will remain  in effect (meaning this test  can be used) for the duration of the COVID-19 declaration under Section 564(b)(1) of the Act, 21 U.S.C. section 360bbb-3(b)(1), unless the authorization is terminated or revoked sooner.     Influenza A by PCR NEGATIVE NEGATIVE Final   Influenza B by PCR NEGATIVE NEGATIVE Final    Comment: (NOTE) The Xpert Xpress SARS-CoV-2/FLU/RSV assay is intended as an aid in  the diagnosis of influenza from Nasopharyngeal swab specimens and  should not be used as a sole basis for treatment. Nasal washings and  aspirates are unacceptable for Xpert Xpress SARS-CoV-2/FLU/RSV  testing.  Fact Sheet for Patients: PinkCheek.be  Fact Sheet for Healthcare Providers: GravelBags.it  This test is not yet approved or cleared by the [place] FDA and  has been authorized for detection and/or diagnosis of SARS-CoV-2 by  FDA under an Emergency Use Authorization (EUA). This EUA will remain  in effect (meaning this test can be used) for the duration of the  Covid-19 declaration under Section 564(b)(1) of the Act, 21  U.S.C. section 360bbb-3(b)(1), unless the authorization is  terminated or revoked. Performed at [facility], [address] [place], [place] [address]     Radiology Studies: CT Head Wo Contrast  Result Date: 09/07/2020 CLINICAL DATA:  Dizziness for 1 hour with transient tingling in the left fingers and lips. Stroke suspected. EXAM: CT HEAD WITHOUT CONTRAST TECHNIQUE: Contiguous axial images were obtained from the base of the skull through the vertex without intravenous contrast. COMPARISON:  None. FINDINGS: Brain: There is no evidence of acute intracranial hemorrhage, mass lesion, brain edema or extra-axial fluid collection. The ventricles and subarachnoid spaces are appropriately sized for age. There is no CT evidence of acute cortical infarction. Vascular:  No hyperdense vessel identified. Skull: Negative for fracture or focal lesion.  Sinuses/Orbits: Mucous retention cyst or polyp medially in the right maxillary sinus with partial ethmoid sinus opacification on the right. The additional visualized paranasal sinuses, mastoid air cells and middle ears are clear. No orbital abnormalities. Other: None. IMPRESSION: 1. No acute intracranial findings. 2. Mild right maxillary and ethmoid sinus disease as described. Electronically Signed   By: Richardean Sale M.D.   On: 09/07/2020 18:03   ECHOCARDIOGRAM COMPLETE  Result Date: 09/08/2020    ECHOCARDIOGRAM REPORT   Patient Name:   Matthew Wise Date of Exam: 09/08/2020 Medical Rec #:  [id number]       Height:  72.0 in Accession #:    [id number]      Weight:       275.0 lb Date of Birth:  03-25-1961      BSA:          2.439 m Patient Age:    4 years        BP:           135/83 mmHg Patient Gender: M               HR:           48 bpm. Exam Location:  Inpatient Procedure: 2D Echo Indications:    Stroke I163.9  History:        Patient has no prior history of Echocardiogram examinations. H/o                 Pulmonary Embolus; Risk Factors:Hypertension.  Sonographer:    Mikki Santee RDCS (AE) Referring Phys: [id number] OLADAPO ADEFESO IMPRESSIONS  1. Left ventricular ejection fraction, by estimation, is 55 to 60%. The left ventricle has normal function. The left ventricle has no regional wall motion abnormalities. Left ventricular diastolic parameters are indeterminate.  2. Right ventricular systolic function is normal. The right ventricular size is normal.  3. Left atrial size was mildly dilated.  4. The mitral valve is grossly normal. Trivial mitral valve regurgitation.  5. The aortic valve is grossly normal. Aortic valve regurgitation is trivial.  6. The inferior vena cava is normal in size with greater than 50% respiratory variability, suggesting right atrial pressure of 3 mmHg. Comparison(s): No prior Echocardiogram. FINDINGS  Left Ventricle: Concentric remodeling noted. Diastology suggestive  of elevated filling pressures. Left ventricular ejection fraction, by estimation, is 55 to 60%. The left ventricle has normal function. The left ventricle has no regional wall motion abnormalities. The left ventricular internal cavity size was normal in size. There is no left ventricular hypertrophy. Left ventricular diastolic parameters are indeterminate. Right Ventricle: The right ventricular size is normal. No increase in right ventricular wall thickness. Right ventricular systolic function is normal. Left Atrium: Left atrial size was mildly dilated. Right Atrium: Right atrial size was normal in size. Pericardium: There is no evidence of pericardial effusion. Mitral Valve: The mitral valve is grossly normal. Trivial mitral valve regurgitation. Tricuspid Valve: The tricuspid valve is grossly normal. Tricuspid valve regurgitation is trivial. Aortic Valve: The aortic valve is grossly normal. Aortic valve regurgitation is trivial. Pulmonic Valve: The pulmonic valve was not well visualized. Pulmonic valve regurgitation is not visualized. Aorta: The aortic root and ascending aorta are structurally normal, with no evidence of dilitation. Venous: The inferior vena cava is normal in size with greater than 50% respiratory variability, suggesting right atrial pressure of 3 mmHg. IAS/Shunts: The atrial septum is grossly normal.  LEFT VENTRICLE PLAX 2D LVIDd:         5.59 cm  Diastology LVIDs:         3.51 cm  LV e' medial:    4.90 cm/s LV PW:         1.20 cm  LV E/e' medial:  13.3 LV IVS:        1.16 cm  LV e' lateral:   4.46 cm/s LVOT diam:     2.30 cm  LV E/e' lateral: 14.6 LV SV:         111 LV SV Index:   45 LVOT Area:     4.15 cm  RIGHT VENTRICLE RV S prime:  12.00 cm/s TAPSE (M-mode): 2.5 cm LEFT ATRIUM             Index       RIGHT ATRIUM           Index LA diam:        3.60 cm 1.48 cm/m  RA Area:     23.40 cm LA Vol (A2C):   83.5 ml 34.23 ml/m RA Volume:   77.10 ml  31.61 ml/m LA Vol (A4C):   60.4 ml 24.76  ml/m LA Biplane Vol: 71.2 ml 29.19 ml/m  AORTIC VALVE LVOT Vmax:   103.00 cm/s LVOT Vmean:  63.100 cm/s LVOT VTI:    0.266 m  AORTA Ao Root diam: 3.10 cm Ao Asc diam:  3.30 cm MITRAL VALVE MV Area (PHT): 1.98 cm    SHUNTS MV Decel Time: 384 msec    Systemic VTI:  0.27 m MV E velocity: 65.10 cm/s  Systemic Diam: 2.30 cm MV A velocity: 65.10 cm/s MV E/A ratio:  1.00 Rudean Haskell MD Electronically signed by Rudean Haskell MD Signature Date/Time: 09/08/2020/1:44:47 PM    Final    CT ANGIO HEAD CODE STROKE  Result Date: 09/07/2020 CLINICAL DATA:  Initial evaluation for acute neuro deficit, stroke suspected. Acute dizziness for 1 hour with left-sided tingling. EXAM: CT ANGIOGRAPHY HEAD AND NECK TECHNIQUE: Multidetector CT imaging of the head and neck was performed using the standard protocol during bolus administration of intravenous contrast. Multiplanar CT image reconstructions and MIPs were obtained to evaluate the vascular anatomy. Carotid stenosis measurements (when applicable) are obtained utilizing NASCET criteria, using the distal internal carotid diameter as the denominator. CONTRAST:  1mL OMNIPAQUE IOHEXOL 350 MG/ML SOLN COMPARISON:  Prior head CT from earlier the same day. FINDINGS: CTA NECK FINDINGS Aortic arch: Visualized aortic arch of normal caliber with normal branch pattern. No hemodynamically significant stenosis seen about the origin of the great vessels. Right carotid system: Right CCA widely patent from its origin to the bifurcation. Mild for age atheromatous change about the right bifurcation without hemodynamically significant stenosis. Right ICA widely patent distally without stenosis, dissection or occlusion. Left carotid system: Left CCA widely patent from its origin to the bifurcation without stenosis. Mild for age atheromatous change about the left bifurcation without significant stenosis. Left ICA widely patent distally to the skull base without stenosis, dissection or  occlusion. Vertebral arteries: Both vertebral arteries arise from the subclavian arteries. No proximal subclavian artery stenosis. Vertebral arteries are markedly diminutive in hypoplastic bilaterally, but remain patent without stenosis, dissection or occlusion. Skeleton: No acute osseous abnormality. No discrete or worrisome osseous lesions. Prominent degenerative changes noted about the C1-2 articulation. Other neck: No other acute soft tissue abnormality within the neck. 1.5 cm well-circumscribed round cystic lesion within the subcutaneous fat at the right submandibular region noted (series 4, image 55), favored to reflect a sebaceous cyst. No adenopathy elsewhere within the neck. 1.8 cm left thyroid nodule (series 4, image 44). Few additional scattered subcentimeter nodules noted as well. Upper chest: Visualized upper chest demonstrates no acute finding. Review of the MIP images confirms the above findings CTA HEAD FINDINGS Anterior circulation: Examination limited by motion artifact. Both internal carotid arteries widely patent to the termini without stenosis or other abnormality. A1 segments widely patent. Normal anterior communicating artery complex. Anterior cerebral arteries grossly patent to their distal aspects without appreciable stenosis. No M1 stenosis or occlusion. Negative MCA bifurcations. No visible proximal MCA branch occlusion. Distal MCA branches grossly perfused and symmetric.  Posterior circulation: Vertebral arteries patent to the vertebrobasilar junction without stenosis. Left PICA patent the right vertebral artery essentially primarily supplies a common right PICA/AICA origin, immediately dividing from the vertebrobasilar junction, and contributing little to the remainder of the posterior circulation. The basilar artery is markedly diminutive in hypoplastic distally, and not well seen, but grossly patent. Superior cerebral arteries are perfused bilaterally. Fetal type origin of both PCAs  supplied via a robust bilateral posterior communicating arteries. Both PCAs perfused to their distal aspects without stenosis. Venous sinuses: Patent. Anatomic variants: Fetal type origin of the PCAs with markedly diminutive vertebrobasilar system. No aneurysm. Review of the MIP images confirms the above findings IMPRESSION: 1. Negative CTA for emergent large vessel occlusion. 2. Mild for age atheromatous change about the carotid bifurcations without hemodynamically significant stenosis. 3. Fetal type origin of the PCAs with secondary markedly diminutive vertebrobasilar system. 4. 1.8 cm left thyroid nodule, indeterminate. Further evaluation with dedicated thyroid ultrasound recommended. This could be performed on a nonemergent outpatient basis. (ref: J Am Coll Radiol. 2015 Feb;12(2): 143-50). Electronically Signed   By: Jeannine Boga M.D.   On: 09/07/2020 23:45   CT ANGIO NECK CODE STROKE  Result Date: 09/07/2020 CLINICAL DATA:  Initial evaluation for acute neuro deficit, stroke suspected. Acute dizziness for 1 hour with left-sided tingling. EXAM: CT ANGIOGRAPHY HEAD AND NECK TECHNIQUE: Multidetector CT imaging of the head and neck was performed using the standard protocol during bolus administration of intravenous contrast. Multiplanar CT image reconstructions and MIPs were obtained to evaluate the vascular anatomy. Carotid stenosis measurements (when applicable) are obtained utilizing NASCET criteria, using the distal internal carotid diameter as the denominator. CONTRAST:  24mL OMNIPAQUE IOHEXOL 350 MG/ML SOLN COMPARISON:  Prior head CT from earlier the same day. FINDINGS: CTA NECK FINDINGS Aortic arch: Visualized aortic arch of normal caliber with normal branch pattern. No hemodynamically significant stenosis seen about the origin of the great vessels. Right carotid system: Right CCA widely patent from its origin to the bifurcation. Mild for age atheromatous change about the right bifurcation without  hemodynamically significant stenosis. Right ICA widely patent distally without stenosis, dissection or occlusion. Left carotid system: Left CCA widely patent from its origin to the bifurcation without stenosis. Mild for age atheromatous change about the left bifurcation without significant stenosis. Left ICA widely patent distally to the skull base without stenosis, dissection or occlusion. Vertebral arteries: Both vertebral arteries arise from the subclavian arteries. No proximal subclavian artery stenosis. Vertebral arteries are markedly diminutive in hypoplastic bilaterally, but remain patent without stenosis, dissection or occlusion. Skeleton: No acute osseous abnormality. No discrete or worrisome osseous lesions. Prominent degenerative changes noted about the C1-2 articulation. Other neck: No other acute soft tissue abnormality within the neck. 1.5 cm well-circumscribed round cystic lesion within the subcutaneous fat at the right submandibular region noted (series 4, image 55), favored to reflect a sebaceous cyst. No adenopathy elsewhere within the neck. 1.8 cm left thyroid nodule (series 4, image 44). Few additional scattered subcentimeter nodules noted as well. Upper chest: Visualized upper chest demonstrates no acute finding. Review of the MIP images confirms the above findings CTA HEAD FINDINGS Anterior circulation: Examination limited by motion artifact. Both internal carotid arteries widely patent to the termini without stenosis or other abnormality. A1 segments widely patent. Normal anterior communicating artery complex. Anterior cerebral arteries grossly patent to their distal aspects without appreciable stenosis. No M1 stenosis or occlusion. Negative MCA bifurcations. No visible proximal MCA branch occlusion. Distal MCA branches grossly  perfused and symmetric. Posterior circulation: Vertebral arteries patent to the vertebrobasilar junction without stenosis. Left PICA patent the right vertebral artery  essentially primarily supplies a common right PICA/AICA origin, immediately dividing from the vertebrobasilar junction, and contributing little to the remainder of the posterior circulation. The basilar artery is markedly diminutive in hypoplastic distally, and not well seen, but grossly patent. Superior cerebral arteries are perfused bilaterally. Fetal type origin of both PCAs supplied via a robust bilateral posterior communicating arteries. Both PCAs perfused to their distal aspects without stenosis. Venous sinuses: Patent. Anatomic variants: Fetal type origin of the PCAs with markedly diminutive vertebrobasilar system. No aneurysm. Review of the MIP images confirms the above findings IMPRESSION: 1. Negative CTA for emergent large vessel occlusion. 2. Mild for age atheromatous change about the carotid bifurcations without hemodynamically significant stenosis. 3. Fetal type origin of the PCAs with secondary markedly diminutive vertebrobasilar system. 4. 1.8 cm left thyroid nodule, indeterminate. Further evaluation with dedicated thyroid ultrasound recommended. This could be performed on a nonemergent outpatient basis. (ref: J Am Coll Radiol. 2015 Feb;12(2): 143-50). Electronically Signed   By: Jeannine Boga M.D.   On: 09/07/2020 23:45   Scheduled Meds: Continuous Infusions:   LOS: 0 days    Time spent: 35 mins    Reshunda Strider, MD [facility]   If 7PM-7AM, please contact night-coverage

## 2020-09-08 NOTE — ED Notes (Signed)
Pt has eaten most of lunch  Ambulated heel to toe to bathroom without assist   Awaiting bed assignment and transfer

## 2020-09-08 NOTE — Progress Notes (Signed)
  Echocardiogram 2D Echocardiogram has been performed.  Jennette Dubin 09/08/2020, 12:04 PM

## 2020-09-08 NOTE — ED Notes (Signed)
Supper provided

## 2020-09-08 NOTE — ED Notes (Signed)
Meal provided   SO at bedside

## 2020-09-08 NOTE — ED Notes (Signed)
-  pt reports he has been waiting since yesterday for transfer to [facility] for MRI  Reports he is hungry and wants to eat  Is upset that he he has  Not had MRI so he can eat  Active listening   encouragement

## 2020-09-09 ENCOUNTER — Encounter (HOSPITAL_COMMUNITY): Payer: Self-pay | Admitting: Internal Medicine

## 2020-09-09 ENCOUNTER — Observation Stay (HOSPITAL_COMMUNITY): Payer: Medicare Other

## 2020-09-09 DIAGNOSIS — F17210 Nicotine dependence, cigarettes, uncomplicated: Secondary | ICD-10-CM | POA: Diagnosis present

## 2020-09-09 DIAGNOSIS — Z9114 Patient's other noncompliance with medication regimen: Secondary | ICD-10-CM

## 2020-09-09 DIAGNOSIS — Z86718 Personal history of other venous thrombosis and embolism: Secondary | ICD-10-CM

## 2020-09-09 DIAGNOSIS — Z8679 Personal history of other diseases of the circulatory system: Secondary | ICD-10-CM | POA: Diagnosis not present

## 2020-09-09 DIAGNOSIS — D573 Sickle-cell trait: Secondary | ICD-10-CM | POA: Diagnosis present

## 2020-09-09 DIAGNOSIS — N183 Chronic kidney disease, stage 3 unspecified: Secondary | ICD-10-CM

## 2020-09-09 DIAGNOSIS — Z7901 Long term (current) use of anticoagulants: Secondary | ICD-10-CM | POA: Diagnosis not present

## 2020-09-09 DIAGNOSIS — R001 Bradycardia, unspecified: Secondary | ICD-10-CM

## 2020-09-09 DIAGNOSIS — R778 Other specified abnormalities of plasma proteins: Secondary | ICD-10-CM

## 2020-09-09 DIAGNOSIS — I639 Cerebral infarction, unspecified: Secondary | ICD-10-CM

## 2020-09-09 DIAGNOSIS — R7303 Prediabetes: Secondary | ICD-10-CM | POA: Diagnosis present

## 2020-09-09 DIAGNOSIS — I10 Essential (primary) hypertension: Secondary | ICD-10-CM

## 2020-09-09 DIAGNOSIS — I2782 Chronic pulmonary embolism: Secondary | ICD-10-CM | POA: Diagnosis not present

## 2020-09-09 DIAGNOSIS — R200 Anesthesia of skin: Secondary | ICD-10-CM | POA: Diagnosis not present

## 2020-09-09 DIAGNOSIS — Z86711 Personal history of pulmonary embolism: Secondary | ICD-10-CM | POA: Diagnosis not present

## 2020-09-09 DIAGNOSIS — I6340 Cerebral infarction due to embolism of unspecified cerebral artery: Secondary | ICD-10-CM | POA: Diagnosis present

## 2020-09-09 DIAGNOSIS — I82403 Acute embolism and thrombosis of unspecified deep veins of lower extremity, bilateral: Secondary | ICD-10-CM | POA: Diagnosis not present

## 2020-09-09 DIAGNOSIS — Q268 Other congenital malformations of great veins: Secondary | ICD-10-CM | POA: Diagnosis not present

## 2020-09-09 DIAGNOSIS — Z713 Dietary counseling and surveillance: Secondary | ICD-10-CM | POA: Diagnosis not present

## 2020-09-09 DIAGNOSIS — N1831 Chronic kidney disease, stage 3a: Secondary | ICD-10-CM | POA: Diagnosis not present

## 2020-09-09 DIAGNOSIS — I351 Nonrheumatic aortic (valve) insufficiency: Secondary | ICD-10-CM | POA: Diagnosis not present

## 2020-09-09 DIAGNOSIS — R202 Paresthesia of skin: Secondary | ICD-10-CM | POA: Diagnosis not present

## 2020-09-09 DIAGNOSIS — I248 Other forms of acute ischemic heart disease: Secondary | ICD-10-CM | POA: Diagnosis present

## 2020-09-09 DIAGNOSIS — E669 Obesity, unspecified: Secondary | ICD-10-CM | POA: Diagnosis present

## 2020-09-09 DIAGNOSIS — E876 Hypokalemia: Secondary | ICD-10-CM | POA: Diagnosis present

## 2020-09-09 DIAGNOSIS — G459 Transient cerebral ischemic attack, unspecified: Secondary | ICD-10-CM | POA: Diagnosis not present

## 2020-09-09 DIAGNOSIS — I340 Nonrheumatic mitral (valve) insufficiency: Secondary | ICD-10-CM | POA: Diagnosis not present

## 2020-09-09 DIAGNOSIS — Z6837 Body mass index (BMI) 37.0-37.9, adult: Secondary | ICD-10-CM | POA: Diagnosis not present

## 2020-09-09 DIAGNOSIS — I361 Nonrheumatic tricuspid (valve) insufficiency: Secondary | ICD-10-CM | POA: Diagnosis not present

## 2020-09-09 DIAGNOSIS — H53139 Sudden visual loss, unspecified eye: Secondary | ICD-10-CM | POA: Diagnosis not present

## 2020-09-09 DIAGNOSIS — I2602 Saddle embolus of pulmonary artery with acute cor pulmonale: Secondary | ICD-10-CM | POA: Diagnosis not present

## 2020-09-09 DIAGNOSIS — Z20822 Contact with and (suspected) exposure to covid-19: Secondary | ICD-10-CM | POA: Diagnosis present

## 2020-09-09 DIAGNOSIS — D6869 Other thrombophilia: Secondary | ICD-10-CM | POA: Diagnosis present

## 2020-09-09 DIAGNOSIS — Z79899 Other long term (current) drug therapy: Secondary | ICD-10-CM | POA: Diagnosis not present

## 2020-09-09 DIAGNOSIS — R42 Dizziness and giddiness: Secondary | ICD-10-CM | POA: Diagnosis not present

## 2020-09-09 DIAGNOSIS — N189 Chronic kidney disease, unspecified: Secondary | ICD-10-CM | POA: Diagnosis not present

## 2020-09-09 DIAGNOSIS — Z9119 Patient's noncompliance with other medical treatment and regimen: Secondary | ICD-10-CM | POA: Diagnosis not present

## 2020-09-09 DIAGNOSIS — I4891 Unspecified atrial fibrillation: Secondary | ICD-10-CM | POA: Diagnosis not present

## 2020-09-09 DIAGNOSIS — I129 Hypertensive chronic kidney disease with stage 1 through stage 4 chronic kidney disease, or unspecified chronic kidney disease: Secondary | ICD-10-CM | POA: Diagnosis present

## 2020-09-09 HISTORY — DX: Cerebral infarction, unspecified: I63.9

## 2020-09-09 LAB — BASIC METABOLIC PANEL
Anion gap: 7 (ref 5–15)
BUN: 19 mg/dL (ref 6–20)
CO2: 27 mmol/L (ref 22–32)
Calcium: 8.8 mg/dL — ABNORMAL LOW (ref 8.9–10.3)
Chloride: 102 mmol/L (ref 98–111)
Creatinine, Ser: 1.54 mg/dL — ABNORMAL HIGH (ref 0.61–1.24)
GFR, Estimated: 49 mL/min — ABNORMAL LOW (ref >60)
Glucose, Bld: 137 mg/dL — ABNORMAL HIGH (ref 70–99)
Potassium: 3.3 mmol/L — ABNORMAL LOW (ref 3.5–5.1)
Sodium: 136 mmol/L (ref 135–145)

## 2020-09-09 LAB — TROPONIN I (HIGH SENSITIVITY): Troponin I (High Sensitivity): 66 ng/L — ABNORMAL HIGH (ref <18)

## 2020-09-09 IMAGING — MR MR HEAD W/O CM
11 of 12 series · 41 of 48 positions shown · non-contrast
Comparison: CT exams from [DATE]

CLINICAL DATA: Neuro deficit, acute stroke suspected. Weakness and
headache for 3 days.

EXAM:
MRI HEAD WITHOUT CONTRAST
TECHNIQUE: Multiplanar, multiecho pulse sequences of the brain and surrounding
structures were obtained without intravenous contrast.

[Series 5: DWI · axial · 4.0mm · 0.88mm/px · z∈[-84,+54]mm · 5 of 36 slices shown (1 of 6)]
[im 1/36]
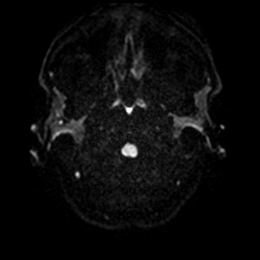
[im 9/36]
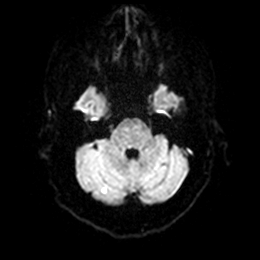
[im 18/36]
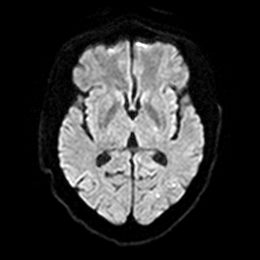
[im 27/36]
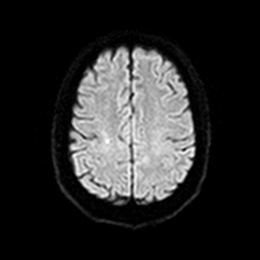
[im 36/36]
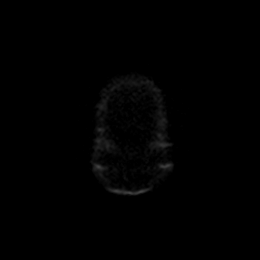

[Series 5: DWI · axial · 4.0mm · 0.88mm/px · z∈[-84,+54]mm · 5 of 36 slices shown (2 of 6)]
[im 1/36]
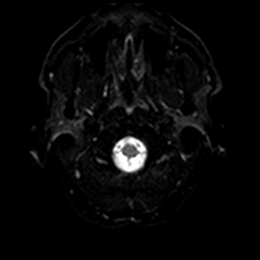
[im 9/36]
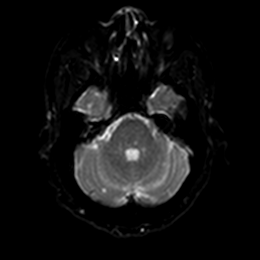
[im 18/36]
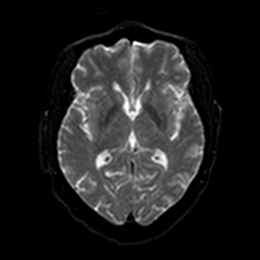
[im 27/36]
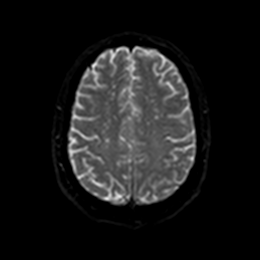
[im 36/36]
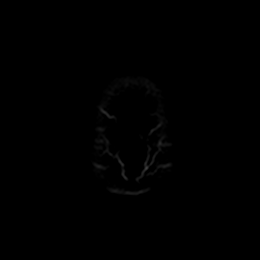

[Series 6: DWI · axial · 4.0mm · 0.88mm/px · z∈[-84,+54]mm · 4 of 36 slices shown (3 of 6)]
[im 1/36]
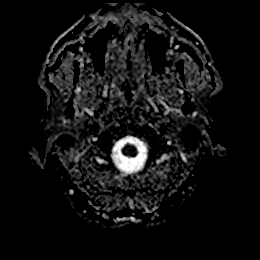
[im 12/36]
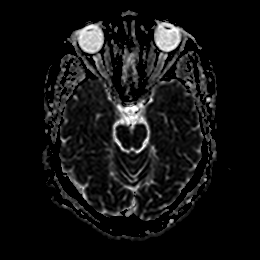
[im 24/36]
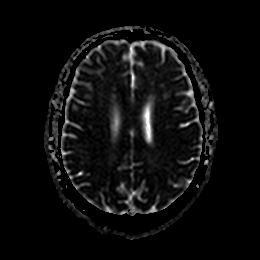
[im 36/36]
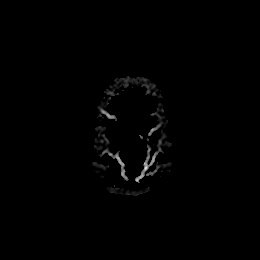

[Series 7: DWI · coronal · 4.0mm · 0.88mm/px · 4 of 32 slices shown (4 of 6)]
[im 1/32]
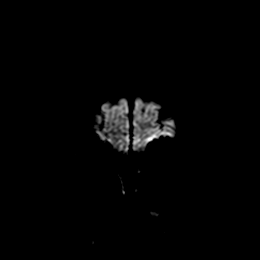
[im 11/32]
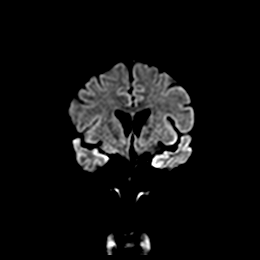
[im 21/32]
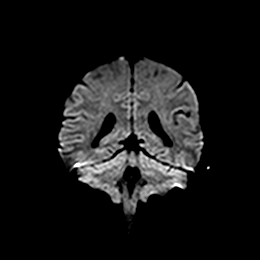
[im 32/32]
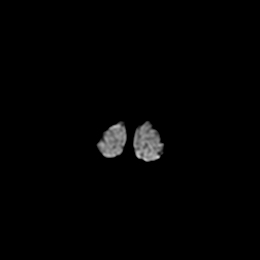

[Series 7: DWI · coronal · 4.0mm · 0.88mm/px · 4 of 32 slices shown (5 of 6)]
[im 1/32]
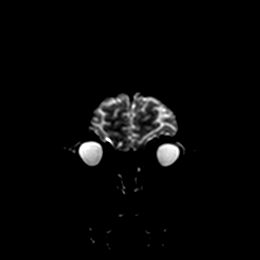
[im 11/32]
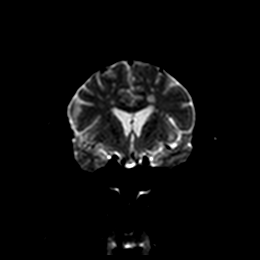
[im 21/32]
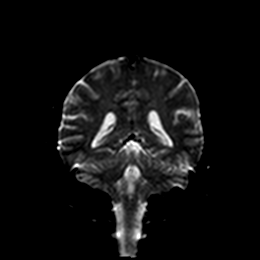
[im 32/32]
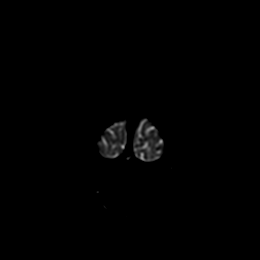

[Series 8: DWI · coronal · 4.0mm · 0.88mm/px · 4 of 32 slices shown (6 of 6)]
[im 1/32]
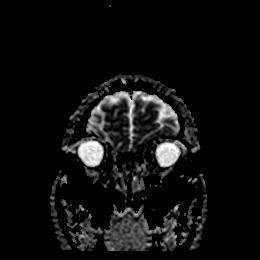
[im 11/32]
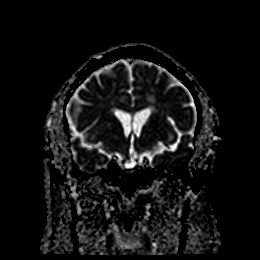
[im 21/32]
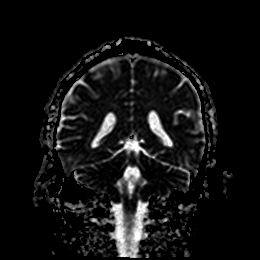
[im 32/32]
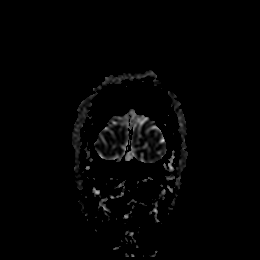

[Series 9: T1 · sagittal · 5.0mm · 0.94mm/px · 3 of 25 slices shown]
[im 1/25]
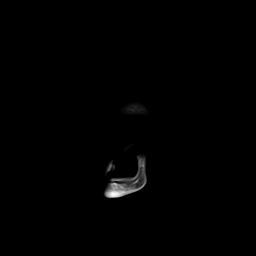
[im 13/25]
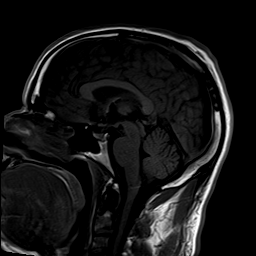
[im 25/25]
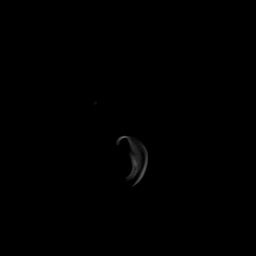

[Series 15: T2 · axial · 5.0mm · 0.72mm/px · z∈[-81,+51]mm · 2 of 20 slices shown (1 of 2)]
[im 1/20]
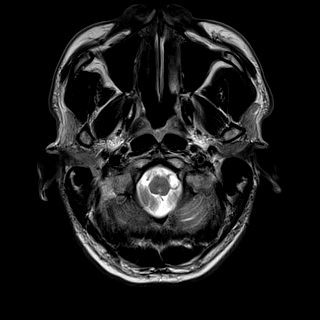
[im 20/20]
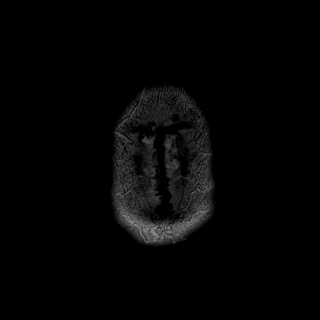

[Series 16: ax hemo · axial · 5.0mm · 0.86mm/px · z∈[-85,+58]mm · 3 of 25 slices shown]
[im 1/25]
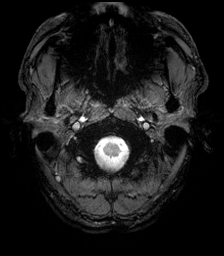
[im 13/25]
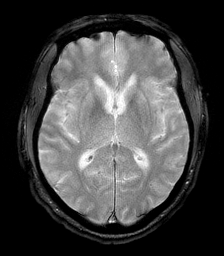
[im 25/25]
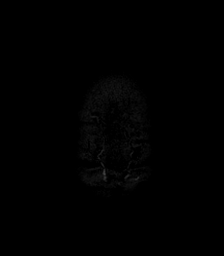

[Series 17: FLAIR · axial · 4.0mm · 0.43mm/px · z∈[-75,+48]mm · 4 of 32 slices shown]
[im 1/32]
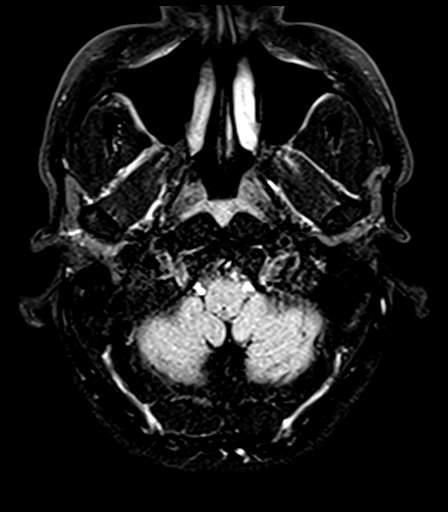
[im 11/32]
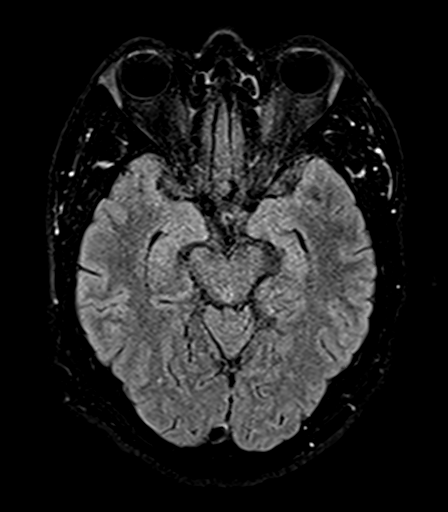
[im 21/32]
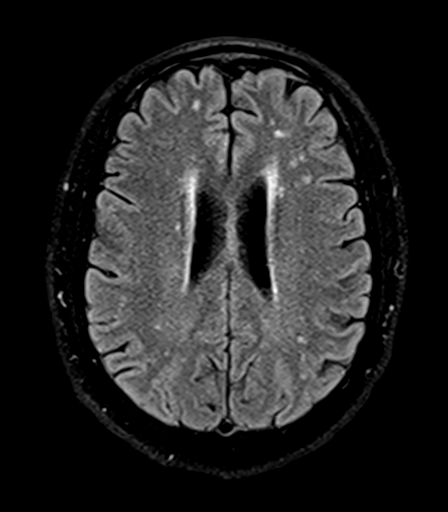
[im 32/32]
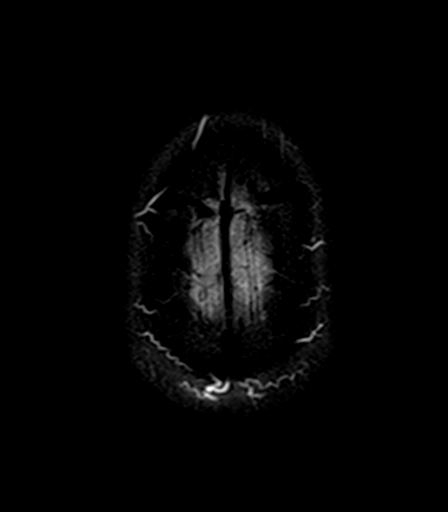

[Series 19: T2 · coronal · 5.0mm · 0.72mm/px · 3 of 28 slices shown (2 of 2)]
[im 1/28]
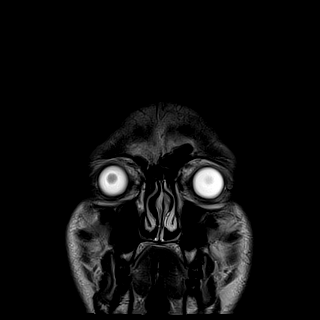
[im 14/28]
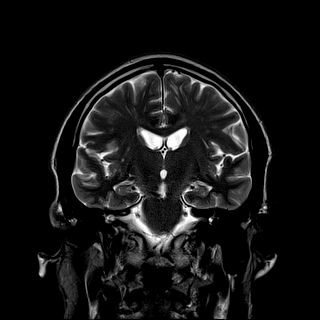
[im 28/28]
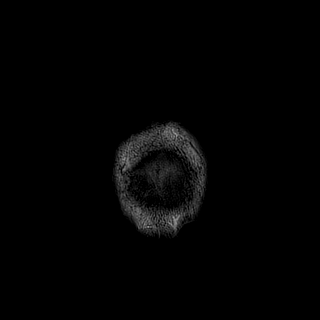

[41 of 48 positions shown; findings below may reference images not displayed]

FINDINGS: Brain: There are many small infarcts both supratentorially and
infratentorially. Sing close small infarcts within the right
cerebellum, bilateral occipital lobes, bilateral frontal lobes and
the left parietal and temporal lobes. Mild associated edema without
substantial mass effect. No midline shift. Basal cisterns are
patent. Additional superimposed T2/FLAIR hyperintensities,
compatible with chronic microvascular ischemic disease. No
hydrocephalus. No mass lesion.

Vascular: Poor visualization of the basilar artery flow void, which
correlates with fetal type PCAs bilaterally and diminutive
vertebrobasilar system seen on recent CTA.

Skull and upper cervical spine: Degenerative changes at the
craniocervical junction with pannus posterior to the dens but no
high-grade canal stenosis.

Sinuses/Orbits: Right maxillary retention cyst. Mild ethmoid air
cell mucosal thickening. Otherwise, sinuses are clear. No acute
orbital abnormality.

Other: No mastoid effusions.
IMPRESSION: Many small supratentorial and infratentorial acute/early subacute
infarcts, as above. Involvement of multiple vascular territories
suggests a central embolic source. Mild associated edema without
substantial mass effect.

## 2020-09-09 MED ORDER — POTASSIUM CHLORIDE CRYS ER 20 MEQ PO TBCR
20.0000 meq | EXTENDED_RELEASE_TABLET | Freq: Once | ORAL | Status: AC
  Administered 2020-09-09: 20 meq via ORAL
  Filled 2020-09-09: qty 1

## 2020-09-09 MED ORDER — ATORVASTATIN CALCIUM 40 MG PO TABS
40.0000 mg | ORAL_TABLET | Freq: Every day | ORAL | Status: DC
  Administered 2020-09-09 – 2020-09-11 (×2): 40 mg via ORAL
  Filled 2020-09-09 (×2): qty 1

## 2020-09-09 NOTE — Consult Note (Addendum)
Cardiology Consultation:   Patient ID: Matthew Wise MRN: [id number]; DOB: [birth year]  Admit date: 09/07/2020 Date of Consult: 09/09/2020  Primary Care Provider: Doree Albee, MD [facility] Cardiologist: No primary care provider on file. new [place] Electrophysiologist:  None     Patient Profile:   Matthew Wise is a 59 y.o. male with a hx of HTN, obesity, noncompliance who is being seen today for the evaluation of elevated troponins at the request of Dr. Dwyane Dee.  History of Present Illness:   Matthew Wise with a history of hypertension, obesity, DVT and PE in 2012 with noncompliance of Xarelto past medical history notes clotting disorder and supposed to be on chronic anticoagulation, CKD stage III, prediabetes, tobacco abuse.  Patient presents to the emergency room with sudden onset of dizziness associated with blurred vision and numbness in his left fingers.  Dizziness and blurred vision lasted several hours the numbness and tingling resolved in 15 to 20 minutes.  CT without contrast no acute findings, MRI done this am-small supratentorial and infratentorial acute/early subacute infarcts.   Troponins 46, 57, 60 and 62 no chest pain. HR down 37/m while sleeping. Says he snores but never had sleep study. Denies chest pain, palpitations, dizziness prior to admission, edema or cardiac complaints. Smokes 1 ppd. Says he couldn't afford his meds in the past. On disability for clots.   Past Medical History:  Diagnosis Date  . Acute CVA (cerebrovascular accident) ([place]) 09/09/2020  . Chronic anticoagulation 11/14/2014  . CKD (chronic kidney disease) stage 3, GFR 30-59 ml/min (HCC) 10/24/2019  . CKD (chronic kidney disease) stage 3, GFR 30-59 ml/min (HCC) 10/24/2019  . Clotting disorder ([place])   . 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 DVT (deep venous thrombosis) ([place]) 06/15/2011  . DVT of  leg (deep venous thrombosis) (HCC)    rt. leg  . H/O PE (pulmonary embolism) 06/15/2011  . 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 Prediabetes 10/24/2019    Past Surgical History:  Procedure Laterality Date  . COLONOSCOPY WITH PROPOFOL N/A 10/29/2015   Procedure: COLONOSCOPY WITH PROPOFOL;  Surgeon: Danie Binder, MD;  Location: [facility];  Service: Endoscopy;  Laterality: N/A;  930  . None to date  10/07/15     Home Medications:  Prior to Admission medications   Medication Sig Start Date End Date Taking? Authorizing Provider  amLODipine (NORVASC) 2.5 MG tablet Take 1 tablet (2.5 mg total) by mouth daily. Patient not taking: Reported on 09/07/2020 01/30/20   Ailene Ards, NP  rivaroxaban (XARELTO) 20 MG TABS tablet Take 1 tablet (20 mg total) by mouth daily with supper. Patient not taking: Reported on 09/07/2020 01/30/20   Ailene Ards, NP    Inpatient Medications: Scheduled Meds:  Continuous Infusions:  PRN Meds:   Allergies:   No Known Allergies  Social History:   Social History   Socioeconomic History  . Marital status: Divorced    Spouse name: Not on file  . Number of children: 2  . Years of education: 13  . Highest education level: Not on file  Occupational History  . Occupation: disability    Comment: DVT  Tobacco Use  . Smoking status: Current Every Day Smoker    Packs/day: 1.00    Years: 30.00    Pack years: 30.00    Types: Cigarettes    Start date: 12/01/1983  .  Smokeless tobacco: Never Used  . Tobacco comment: counseled  Vaping Use  . Vaping Use: Never used  Substance and Sexual Activity  . Alcohol use: Yes    Alcohol/week: 3.0 standard drinks    Types: 3 Cans of beer per week    Comment: 2-3 beers a day  . Drug use: Not Currently    Comment: REMOTE HISTORY, Quit in 2007  . Sexual activity: Yes  Other Topics Concern  . Not on file  Social History Narrative   Lives with son   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s   Mows lawns  some   Social Determinants of Health   Financial Resource Strain:   . Difficulty of Paying Living Expenses: Not on file  Food Insecurity:   . Worried About Charity fundraiser in the Last Year: Not on file  . Ran Out of Food in the Last Year: Not on file  Transportation Needs:   . Lack of Transportation (Medical): Not on file  . Lack of Transportation (Non-Medical): Not on file  Physical Activity:   . Days of Exercise per Week: Not on file  . Minutes of Exercise per Session: Not on file  Stress:   . Feeling of Stress : Not on file  Social Connections:   . Frequency of Communication with Friends and Family: Not on file  . Frequency of Social Gatherings with Friends and Family: Not on file  . Attends Religious Services: Not on file  . Active Member of Clubs or Organizations: Not on file  . Attends Archivist Meetings: Not on file  . Marital Status: Not on file  Intimate Partner Violence:   . Fear of Current or Ex-Partner: Not on file  . Emotionally Abused: Not on file  . Physically Abused: Not on file  . Sexually Abused: Not on file    Family History:    Family History  Problem Relation Age of Onset  . COPD Father   . Stroke Brother   . Pulmonary embolism Sister   . Throat cancer Brother   . Arthritis Mother   . Diabetes Mother   . Stroke Mother   . Hypertension Brother   . Diabetes Brother   . Arthritis Brother   . Diabetes Sister   . Colon cancer Neg Hx      ROS:  Please see the history of present illness.  Review of Systems  Constitutional: Negative.  HENT: Negative.   Eyes: Positive for blurred vision.  Cardiovascular: Negative.   Respiratory: Negative.   Endocrine: Negative.   Hematologic/Lymphatic: Negative.   Musculoskeletal: Negative.   Gastrointestinal: Negative.   Genitourinary: Negative.   Neurological: Positive for dizziness, light-headedness, numbness and paresthesias.    All other ROS reviewed and negative.     Physical  Exam/Data:   Vitals:   09/08/20 2114 09/09/20 0129 09/09/20 0609 09/09/20 0930  BP: (!) 146/107 (!) 152/77 (!) 146/80 (!) 165/90  Pulse: 69 (!) 52 (!) 48 (!) 50  Resp: 16 16 15 18   Temp: 98.7 F (37.1 C) 98 F (36.7 C) 98.4 F (36.9 C) 98.1 F (36.7 C)  TempSrc:      SpO2: 98% 98% 99% 100%  Weight: 120.5 kg     Height: 6' (1.829 m)      No intake or output data in the 24 hours ending 09/09/20 1317 Last 3 Weights 09/08/2020 09/07/2020 02/03/2020  Weight (lbs) 265 lb 10.5 oz 275 lb 280 lb  Weight (kg) 120.5  kg 124.739 kg 127.007 kg     Body mass index is 36.03 kg/m.  General: Obese, in no acute distress  HEENT: normal Lymph: no adenopathy Neck: no JVD Endocrine:  No thryomegaly Vascular: No carotid bruits; FA pulses 2+ bilaterally without bruits  Cardiac:  normal S1, S2; RRR; S4 Lungs:  clear to auscultation bilaterally, no wheezing, rhonchi or rales  Abd: soft, nontender, no hepatomegaly  Ext: no edema, severe varicosities Musculoskeletal:  No deformities, BUE and BLE strength normal and equal Skin: warm and dry  Neuro:  CNs 2-12 intact, no focal abnormalities noted Psych:  Normal affect   EKG:  The EKG was personally reviewed and demonstrates:   Sinus bradycardia 57/m PAC, no acute change Telemetry:  Telemetry was personally reviewed and demonstrates:  Sinus bradycardia   Relevant CV Studies:  2D echo 09/08/2020 IMPRESSIONS     1. Left ventricular ejection fraction, by estimation, is 55 to 60%. The  left ventricle has normal function. The left ventricle has no regional  wall motion abnormalities. Left ventricular diastolic parameters are  indeterminate.   2. Right ventricular systolic function is normal. The right ventricular  size is normal.   3. Left atrial size was mildly dilated.   4. The mitral valve is grossly normal. Trivial mitral valve  regurgitation.   5. The aortic valve is grossly normal. Aortic valve regurgitation is  trivial.   6. The inferior  vena cava is normal in size with greater than 50%  respiratory variability, suggesting right atrial pressure of 3 mmHg.   Laboratory Data:  High Sensitivity Troponin:   Recent Labs  Lab 09/07/20 1811 09/07/20 2008 09/07/20 2329 09/08/20 0156 09/09/20 0125  TROPONINIHS 46* 57* 60* 62* 66*     Chemistry Recent Labs  Lab 09/07/20 1811 09/08/20 0429 09/09/20 0126  NA 138 138 136  K 4.3 3.6 3.3*  CL 104 104 102  CO2 25 25 27   GLUCOSE 92 87 137*  BUN 20 21* 19  CREATININE 1.65* 1.54* 1.54*  CALCIUM 9.2 9.2 8.8*  GFRNONAA 45* 49* 49*  ANIONGAP 9 9 7     Recent Labs  Lab 09/07/20 1811 09/08/20 0429  PROT 7.6 7.1  ALBUMIN 4.0 3.7  AST 21 22  ALT 20 20  ALKPHOS 70 68  BILITOT 1.2 1.0   Hematology Recent Labs  Lab 09/07/20 1811 09/08/20 0429  WBC 4.9 4.8  RBC 5.09 4.96  HGB 15.3 14.7  HCT 44.3 42.7  MCV 87.0 86.1  MCH 30.1 29.6  MCHC 34.5 34.4  RDW 14.6 14.4  PLT 190 178   BNPNo results for input(s): BNP, PROBNP in the last 168 hours.  DDimer No results for input(s): DDIMER in the last 168 hours.   Radiology/Studies:  CT Head Wo Contrast  Result Date: 09/07/2020 CLINICAL DATA:  Dizziness for 1 hour with transient tingling in the left fingers and lips. Stroke suspected. EXAM: CT HEAD WITHOUT CONTRAST TECHNIQUE: Contiguous axial images were obtained from the base of the skull through the vertex without intravenous contrast. COMPARISON:  None. FINDINGS: Brain: There is no evidence of acute intracranial hemorrhage, mass lesion, brain edema or extra-axial fluid collection. The ventricles and subarachnoid spaces are appropriately sized for age. There is no CT evidence of acute cortical infarction. Vascular:  No hyperdense vessel identified. Skull: Negative for fracture or focal lesion. Sinuses/Orbits: Mucous retention cyst or polyp medially in the right maxillary sinus with partial ethmoid sinus opacification on the right. The additional visualized paranasal sinuses,  mastoid air cells and middle ears are clear. No orbital abnormalities. Other: None. IMPRESSION: 1. No acute intracranial findings. 2. Mild right maxillary and ethmoid sinus disease as described. Electronically Signed   By: Richardean Sale M.D.   On: 09/07/2020 18:03   MR BRAIN WO CONTRAST  Result Date: 09/09/2020 CLINICAL DATA:  Neuro deficit, acute stroke suspected. Weakness and headache for 3 days. EXAM: MRI HEAD WITHOUT CONTRAST TECHNIQUE: Multiplanar, multiecho pulse sequences of the brain and surrounding structures were obtained without intravenous contrast. COMPARISON:  CT exams from 09/07/2020 FINDINGS: Brain: There are many small infarcts both supratentorially and infratentorially. Sing close small infarcts within the right cerebellum, bilateral occipital lobes, bilateral frontal lobes and the left parietal and temporal lobes. Mild associated edema without substantial mass effect. No midline shift. Basal cisterns are patent. Additional superimposed T2/FLAIR hyperintensities, compatible with chronic microvascular ischemic disease. No hydrocephalus. No mass lesion. Vascular: Poor visualization of the basilar artery flow void, which correlates with fetal type PCAs bilaterally and diminutive vertebrobasilar system seen on recent CTA. Skull and upper cervical spine: Degenerative changes at the craniocervical junction with pannus posterior to the dens but no high-grade canal stenosis. Sinuses/Orbits: Right maxillary retention cyst. Mild ethmoid air cell mucosal thickening. Otherwise, sinuses are clear. No acute orbital abnormality. Other: No mastoid effusions. IMPRESSION: Many small supratentorial and infratentorial acute/early subacute infarcts, as above. Involvement of multiple vascular territories suggests a central embolic source. Mild associated edema without substantial mass effect. Electronically Signed   By: Margaretha Sheffield MD   On: 09/09/2020 10:03   ECHOCARDIOGRAM COMPLETE  Result Date:  09/08/2020    ECHOCARDIOGRAM REPORT   Patient Name:   Matthew Wise Date of Exam: 09/08/2020 Medical Rec #:  [id number]       Height:       72.0 in Accession #:    [id number]      Weight:       275.0 lb Date of Birth:  [birth year]      BSA:          2.439 m Patient Age:    21 years        BP:           135/83 mmHg Patient Gender: M               HR:           48 bpm. Exam Location:  Inpatient Procedure: 2D Echo Indications:    Stroke I163.9  History:        Patient has no prior history of Echocardiogram examinations. H/o                 Pulmonary Embolus; Risk Factors:Hypertension.  Sonographer:    Mikki Santee RDCS (AE) Referring Phys: [id number] OLADAPO ADEFESO IMPRESSIONS  1. Left ventricular ejection fraction, by estimation, is 55 to 60%. The left ventricle has normal function. The left ventricle has no regional wall motion abnormalities. Left ventricular diastolic parameters are indeterminate.  2. Right ventricular systolic function is normal. The right ventricular size is normal.  3. Left atrial size was mildly dilated.  4. The mitral valve is grossly normal. Trivial mitral valve regurgitation.  5. The aortic valve is grossly normal. Aortic valve regurgitation is trivial.  6. The inferior vena cava is normal in size with greater than 50% respiratory variability, suggesting right atrial pressure of 3 mmHg. Comparison(s): No prior Echocardiogram. FINDINGS  Left Ventricle: Concentric remodeling noted. Diastology suggestive of elevated filling pressures. Left ventricular  ejection fraction, by estimation, is 55 to 60%. The left ventricle has normal function. The left ventricle has no regional wall motion abnormalities. The left ventricular internal cavity size was normal in size. There is no left ventricular hypertrophy. Left ventricular diastolic parameters are indeterminate. Right Ventricle: The right ventricular size is normal. No increase in right ventricular wall thickness. Right ventricular systolic  function is normal. Left Atrium: Left atrial size was mildly dilated. Right Atrium: Right atrial size was normal in size. Pericardium: There is no evidence of pericardial effusion. Mitral Valve: The mitral valve is grossly normal. Trivial mitral valve regurgitation. Tricuspid Valve: The tricuspid valve is grossly normal. Tricuspid valve regurgitation is trivial. Aortic Valve: The aortic valve is grossly normal. Aortic valve regurgitation is trivial. Pulmonic Valve: The pulmonic valve was not well visualized. Pulmonic valve regurgitation is not visualized. Aorta: The aortic root and ascending aorta are structurally normal, with no evidence of dilitation. Venous: The inferior vena cava is normal in size with greater than 50% respiratory variability, suggesting right atrial pressure of 3 mmHg. IAS/Shunts: The atrial septum is grossly normal.  LEFT VENTRICLE PLAX 2D LVIDd:         5.59 cm  Diastology LVIDs:         3.51 cm  LV e' medial:    4.90 cm/s LV PW:         1.20 cm  LV E/e' medial:  13.3 LV IVS:        1.16 cm  LV e' lateral:   4.46 cm/s LVOT diam:     2.30 cm  LV E/e' lateral: 14.6 LV SV:         111 LV SV Index:   45 LVOT Area:     4.15 cm  RIGHT VENTRICLE RV S prime:     12.00 cm/s TAPSE (M-mode): 2.5 cm LEFT ATRIUM             Index       RIGHT ATRIUM           Index LA diam:        3.60 cm 1.48 cm/m  RA Area:     23.40 cm LA Vol (A2C):   83.5 ml 34.23 ml/m RA Volume:   77.10 ml  31.61 ml/m LA Vol (A4C):   60.4 ml 24.76 ml/m LA Biplane Vol: 71.2 ml 29.19 ml/m  AORTIC VALVE LVOT Vmax:   103.00 cm/s LVOT Vmean:  63.100 cm/s LVOT VTI:    0.266 m  AORTA Ao Root diam: 3.10 cm Ao Asc diam:  3.30 cm MITRAL VALVE MV Area (PHT): 1.98 cm    SHUNTS MV Decel Time: 384 msec    Systemic VTI:  0.27 m MV E velocity: 65.10 cm/s  Systemic Diam: 2.30 cm MV A velocity: 65.10 cm/s MV E/A ratio:  1.00 Rudean Haskell MD Electronically signed by Rudean Haskell MD Signature Date/Time: 09/08/2020/1:44:47 PM     Final    CT ANGIO HEAD CODE STROKE  Result Date: 09/07/2020 CLINICAL DATA:  Initial evaluation for acute neuro deficit, stroke suspected. Acute dizziness for 1 hour with left-sided tingling. EXAM: CT ANGIOGRAPHY HEAD AND NECK TECHNIQUE: Multidetector CT imaging of the head and neck was performed using the standard protocol during bolus administration of intravenous contrast. Multiplanar CT image reconstructions and MIPs were obtained to evaluate the vascular anatomy. Carotid stenosis measurements (when applicable) are obtained utilizing NASCET criteria, using the distal internal carotid diameter as the denominator. CONTRAST:  40mL OMNIPAQUE IOHEXOL 350  MG/ML SOLN COMPARISON:  Prior head CT from earlier the same day. FINDINGS: CTA NECK FINDINGS Aortic arch: Visualized aortic arch of normal caliber with normal branch pattern. No hemodynamically significant stenosis seen about the origin of the great vessels. Right carotid system: Right CCA widely patent from its origin to the bifurcation. Mild for age atheromatous change about the right bifurcation without hemodynamically significant stenosis. Right ICA widely patent distally without stenosis, dissection or occlusion. Left carotid system: Left CCA widely patent from its origin to the bifurcation without stenosis. Mild for age atheromatous change about the left bifurcation without significant stenosis. Left ICA widely patent distally to the skull base without stenosis, dissection or occlusion. Vertebral arteries: Both vertebral arteries arise from the subclavian arteries. No proximal subclavian artery stenosis. Vertebral arteries are markedly diminutive in hypoplastic bilaterally, but remain patent without stenosis, dissection or occlusion. Skeleton: No acute osseous abnormality. No discrete or worrisome osseous lesions. Prominent degenerative changes noted about the C1-2 articulation. Other neck: No other acute soft tissue abnormality within the neck. 1.5 cm  well-circumscribed round cystic lesion within the subcutaneous fat at the right submandibular region noted (series 4, image 55), favored to reflect a sebaceous cyst. No adenopathy elsewhere within the neck. 1.8 cm left thyroid nodule (series 4, image 44). Few additional scattered subcentimeter nodules noted as well. Upper chest: Visualized upper chest demonstrates no acute finding. Review of the MIP images confirms the above findings CTA HEAD FINDINGS Anterior circulation: Examination limited by motion artifact. Both internal carotid arteries widely patent to the termini without stenosis or other abnormality. A1 segments widely patent. Normal anterior communicating artery complex. Anterior cerebral arteries grossly patent to their distal aspects without appreciable stenosis. No M1 stenosis or occlusion. Negative MCA bifurcations. No visible proximal MCA branch occlusion. Distal MCA branches grossly perfused and symmetric. Posterior circulation: Vertebral arteries patent to the vertebrobasilar junction without stenosis. Left PICA patent the right vertebral artery essentially primarily supplies a common right PICA/AICA origin, immediately dividing from the vertebrobasilar junction, and contributing little to the remainder of the posterior circulation. The basilar artery is markedly diminutive in hypoplastic distally, and not well seen, but grossly patent. Superior cerebral arteries are perfused bilaterally. Fetal type origin of both PCAs supplied via a robust bilateral posterior communicating arteries. Both PCAs perfused to their distal aspects without stenosis. Venous sinuses: Patent. Anatomic variants: Fetal type origin of the PCAs with markedly diminutive vertebrobasilar system. No aneurysm. Review of the MIP images confirms the above findings IMPRESSION: 1. Negative CTA for emergent large vessel occlusion. 2. Mild for age atheromatous change about the carotid bifurcations without hemodynamically significant  stenosis. 3. Fetal type origin of the PCAs with secondary markedly diminutive vertebrobasilar system. 4. 1.8 cm left thyroid nodule, indeterminate. Further evaluation with dedicated thyroid ultrasound recommended. This could be performed on a nonemergent outpatient basis. (ref: J Am Coll Radiol. 2015 Feb;12(2): 143-50). Electronically Signed   By: Jeannine Boga M.D.   On: 09/07/2020 23:45   CT ANGIO NECK CODE STROKE  Result Date: 09/07/2020 CLINICAL DATA:  Initial evaluation for acute neuro deficit, stroke suspected. Acute dizziness for 1 hour with left-sided tingling. EXAM: CT ANGIOGRAPHY HEAD AND NECK TECHNIQUE: Multidetector CT imaging of the head and neck was performed using the standard protocol during bolus administration of intravenous contrast. Multiplanar CT image reconstructions and MIPs were obtained to evaluate the vascular anatomy. Carotid stenosis measurements (when applicable) are obtained utilizing NASCET criteria, using the distal internal carotid diameter as the denominator. CONTRAST:  31mL  OMNIPAQUE IOHEXOL 350 MG/ML SOLN COMPARISON:  Prior head CT from earlier the same day. FINDINGS: CTA NECK FINDINGS Aortic arch: Visualized aortic arch of normal caliber with normal branch pattern. No hemodynamically significant stenosis seen about the origin of the great vessels. Right carotid system: Right CCA widely patent from its origin to the bifurcation. Mild for age atheromatous change about the right bifurcation without hemodynamically significant stenosis. Right ICA widely patent distally without stenosis, dissection or occlusion. Left carotid system: Left CCA widely patent from its origin to the bifurcation without stenosis. Mild for age atheromatous change about the left bifurcation without significant stenosis. Left ICA widely patent distally to the skull base without stenosis, dissection or occlusion. Vertebral arteries: Both vertebral arteries arise from the subclavian arteries. No  proximal subclavian artery stenosis. Vertebral arteries are markedly diminutive in hypoplastic bilaterally, but remain patent without stenosis, dissection or occlusion. Skeleton: No acute osseous abnormality. No discrete or worrisome osseous lesions. Prominent degenerative changes noted about the C1-2 articulation. Other neck: No other acute soft tissue abnormality within the neck. 1.5 cm well-circumscribed round cystic lesion within the subcutaneous fat at the right submandibular region noted (series 4, image 55), favored to reflect a sebaceous cyst. No adenopathy elsewhere within the neck. 1.8 cm left thyroid nodule (series 4, image 44). Few additional scattered subcentimeter nodules noted as well. Upper chest: Visualized upper chest demonstrates no acute finding. Review of the MIP images confirms the above findings CTA HEAD FINDINGS Anterior circulation: Examination limited by motion artifact. Both internal carotid arteries widely patent to the termini without stenosis or other abnormality. A1 segments widely patent. Normal anterior communicating artery complex. Anterior cerebral arteries grossly patent to their distal aspects without appreciable stenosis. No M1 stenosis or occlusion. Negative MCA bifurcations. No visible proximal MCA branch occlusion. Distal MCA branches grossly perfused and symmetric. Posterior circulation: Vertebral arteries patent to the vertebrobasilar junction without stenosis. Left PICA patent the right vertebral artery essentially primarily supplies a common right PICA/AICA origin, immediately dividing from the vertebrobasilar junction, and contributing little to the remainder of the posterior circulation. The basilar artery is markedly diminutive in hypoplastic distally, and not well seen, but grossly patent. Superior cerebral arteries are perfused bilaterally. Fetal type origin of both PCAs supplied via a robust bilateral posterior communicating arteries. Both PCAs perfused to their  distal aspects without stenosis. Venous sinuses: Patent. Anatomic variants: Fetal type origin of the PCAs with markedly diminutive vertebrobasilar system. No aneurysm. Review of the MIP images confirms the above findings IMPRESSION: 1. Negative CTA for emergent large vessel occlusion. 2. Mild for age atheromatous change about the carotid bifurcations without hemodynamically significant stenosis. 3. Fetal type origin of the PCAs with secondary markedly diminutive vertebrobasilar system. 4. 1.8 cm left thyroid nodule, indeterminate. Further evaluation with dedicated thyroid ultrasound recommended. This could be performed on a nonemergent outpatient basis. (ref: J Am Coll Radiol. 2015 Feb;12(2): 143-50). Electronically Signed   By: Jeannine Boga M.D.   On: 09/07/2020 23:45     Assessment and Plan:   1. Multiple small supratentorial and infratentorial acute/subacute infarcts now on Xarelto-will need TEE and outpatient monitor vs LINQ 2. Elevated troponins: most likely demand ischemia.His BP has been signiif elevated    2D echo yesterday normal LVEF 55 to 60% no wall motion abnormalities. No chest pain or prior cardiac history. 3. Hypertension:   -on amlodipine 4. Sinus bradycardia  HR down to  37 while sleeping.  No signif pauses   Pt hemodynamically stable  He probably does have sleep apnea which could be contributing         No indication for pacer    Follow over time    Outpt eval for sleep apnea 5. 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 Obesity 7. CKD stage III 8. Medical noncompliance             [facility] will not sign off.   Medication Recommendations:    Other recommendations (labs, testing, etc):  Outpatient sleep study through PCP, outpatient 30 day monitor Follow up as an outpatient: Cardiology 6 weeks post hospital For questions or updates,  please contact [place] Please consult [email or web address] for contact info under    Signed, Ermalinda Barrios, PA-C  09/09/2020 1:17 PM  Pt seen and examined   I have amended not to reflect my findings Pt is a 59 yo who presents to [facility] with dizziness, numbness on L fingers, blurred vision.   Found to be hypertensive    Asked to see re bradycardia and also trivial troponin elevation  The pt denies CP ever  Breathing is OK   Says he has not been on meds for BP  On exam:  Pt's HR 20s to 60   BP remains high  140s to 160s today  Pt in NAD Lungs are CTA Cardiac RRR  No S3  No murmurs   Abd is supple  No hepatomegaly   Ext with chronic stasis changes   Varicosities   Healed sore on L shin  Triv LE edema  MRI shows mult small infarcts   Worrisome for embolic  1  Bradycardia   No signifcant pauses    No evid of hemodyn compromise with HR he is having     2  HTN  BP is elevated   Would go down very slowly to keep perfusion given CVA  I would try 2.5 amlodipine   3  Elevated troponin   Trivial elevation  No CP hx  May reflect demand/subendocardial ischemi in setting of HTN uncontrolled HTN   I would not plan invasive evaluation  4  Neuro  MRI findings as noted   Would have neuro see pt to confirm plans and also arrnage f/u    Would recomm TEE to evaluate for cardiac source    Start statin  Hold on HTN meds   Do not want to make hypotensive    5  Hypokalemia   Replete and check in AM     Will need close outpt follow up once he does go home for BP and lipids    Dorris Carnes

## 2020-09-09 NOTE — Anesthesia Preprocedure Evaluation (Addendum)
Anesthesia Evaluation  Patient identified by MRN, date of birth, ID band Patient awake    Reviewed: Allergy & Precautions, NPO status , Patient's Chart, lab work & pertinent test results  History of Anesthesia Complications Negative for: history of anesthetic complications  Airway Mallampati: III  TM Distance: >3 FB Neck ROM: Full    Dental  (+) Dental Advisory Given, Teeth Intact   Pulmonary Current Smoker and Patient abstained from smoking., PE   Pulmonary exam normal breath sounds clear to auscultation       Cardiovascular Exercise Tolerance: Good + DVT  Normal cardiovascular exam Rhythm:Regular Rate:Bradycardia  1. Left ventricular ejection fraction, by estimation, is 55 to 60%. The left ventricle has normal function. The left ventricle has no regional wall motion abnormalities. Left ventricular diastolic parameters are indeterminate.  2. Right ventricular systolic function is normal. The right ventricular size is normal.  3. Left atrial size was mildly dilated.  4. The mitral valve is grossly normal. Trivial mitral valve  regurgitation.  5. The aortic valve is grossly normal. Aortic valve regurgitation is trivial.  6. The inferior vena cava is normal in size with greater than 50% respiratory variability, suggesting right atrial pressure of 3 mmHg.  07-Sep-2020 17:12:02 [facility] ROUTINE RECORD Sinus rhythm Atrial premature complex Nonspecific T abnormalities, inferior leads No significant change since last tracing Confirmed by Davonna Belling [phone]) on 09/07/2020 7:01:28 PM    Neuro/Psych CVA, No Residual Symptoms negative psych ROS   GI/Hepatic negative GI ROS, Neg liver ROS,   Endo/Other  negative endocrine ROS  Renal/GU Renal InsufficiencyRenal disease  negative genitourinary   Musculoskeletal negative musculoskeletal ROS (+)   Abdominal   Peds negative pediatric ROS (+)   Hematology  (+) Blood dyscrasia (clotting disorder), ,   Anesthesia Other Findings   Reproductive/Obstetrics negative OB ROS                           Anesthesia Physical Anesthesia Plan  ASA: III  Anesthesia Plan: General   Post-op Pain Management:    Induction: Intravenous  PONV Risk Score and Plan: TIVA  Airway Management Planned: Nasal Cannula, Natural Airway and Simple Face Mask  Additional Equipment:   Intra-op Plan:   Post-operative Plan:   Informed Consent: I have reviewed the patients History and Physical, chart, labs and discussed the procedure including the risks, benefits and alternatives for the proposed anesthesia with the patient or authorized representative who has indicated his/her understanding and acceptance.     Dental advisory given  Plan Discussed with: CRNA and Surgeon  Anesthesia Plan Comments:         Anesthesia Quick Evaluation

## 2020-09-09 NOTE — Progress Notes (Signed)
OTScreen Note  Patient Details Name: Matthew Wise MRN: [id number] DOB: 1961-02-09   Cancelled Treatment:    Reason Eval/Treat Not Completed: OT screened, no needs identified, will sign off. Patient is functioning at baseline of modified independent to independent with all ADL tasks. No follow up OT needs at this time.    Ailene Ravel, OTR/L,CBIS  [phone]  09/09/2020, 11:27 AM

## 2020-09-09 NOTE — Consult Note (Signed)
[place] A. Merlene Laughter, MD     [email or web address]          Matthew Wise is an 59 y.o. male.   ASSESSMENT/PLAN: 1. ACUTE ONSET DIZZINESS WITH NUMBNESS OF THE LEFT FACIAL REGION AND LEFT UPPER EXTREMITY WITH IMAGING SHOWING MULTIPLE SMALL INFARCTS BILATERALLY HIGHLY SUGGESTIVE OF EMBOLIC PHENOMENA. The patient does have a history of recurrent deep venous thrombosis which suggests that he may have had a paradoxical intracardiac embolic phenomena. There is also a potential that the patient may have had paroxysmal atrial fibrillation.  In any case, given his recurrent deep venous thrombosis in the past, he needs to be on anticoagulation. He seemingly has multiple reasons to be on anticoagulation.  I think it is reasonable to do a 30 day cardiac event monitor for completion sake. He also is undergoing a TEE which is reasonable. Other risk factor reduction includes blood pressure control,  Blood sugar control, weight loss and the use of statin medication.     Patient is a 59 year old right-handed black male who presents with the acute onset of spinning like sensation/vertigo associated with periorbital numbness on the left side and left hand numbness. The sensory symptoms seemed to last for about 20 minutes and resolve with the patient reports that he continued to have vertiginous symptoms lasting for about 2-3 hours. Imaging showed evidence of multiple small infarcts involving the supratentorial and infratentorial areas. The patient tells me that he has had at least 3 deep venous thrombosis involving the legs 1 which resulted in a pulmonary embolism. He is supposed to be on at long-term anticoagulation but has been noncompliant recently. Patient's symptoms lasted for few hours and he tells me he is back to baseline. He does not report having headaches, palpitation or chest pain. The review systems otherwise negative.     GENERAL: This is a well-built slightly overweight male who is  doing okay at this time.  HEENT:  Neck is supple no trauma noted.  ABDOMEN: soft  EXTREMITIES: No edema   BACK:  This is normal.  SKIN: Normal by inspection.    MENTAL STATUS: Alert and oriented -  Including his age and the month. Speech, language and cognition are generally intact. Judgment and insight normal.   CRANIAL NERVES: Pupils are equal, round and reactive to light and accomodation; extra ocular movements are full, there is no significant nystagmus; visual fields are full; upper and lower facial muscles are normal in strength and symmetric, there is no flattening of the nasolabial folds; tongue is midline; uvula is midline; shoulder elevation is normal.  MOTOR: Normal tone, bulk and strength; no pronator drift.  COORDINATION: Left finger to nose is normal, right finger to nose is normal, No rest tremor; no intention tremor; no postural tremor; no bradykinesia.  REFLEXES: Deep tendon reflexes are symmetrical and normal.   SENSATION: Normal to light touch, temperature, and pain.     NIH stroke scale 0.    Blood pressure (!) 144/90, pulse (!) 56, temperature 98.2 F (36.8 C), temperature source Oral, resp. rate 18, height 6' (1.829 m), weight 120.5 kg, SpO2 100 %.  Past Medical History:  Diagnosis Date  . Acute CVA (cerebrovascular accident) ([place]) 09/09/2020  . Chronic anticoagulation 11/14/2014  . CKD (chronic kidney disease) stage 3, GFR 30-59 ml/min (HCC) 10/24/2019  . CKD (chronic kidney disease) stage 3, GFR 30-59 ml/min (HCC) 10/24/2019  . Clotting disorder ([place])   . 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 DVT (deep venous thrombosis) ([place]) 06/15/2011  . DVT of leg (deep venous thrombosis) (HCC)    rt. leg  . H/O PE (pulmonary embolism) 06/15/2011  . 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 Prediabetes 10/24/2019    Past Surgical History:  Procedure Laterality  Date  . COLONOSCOPY WITH PROPOFOL N/A 10/29/2015   Procedure: COLONOSCOPY WITH PROPOFOL;  Surgeon: Danie Binder, MD;  Location: [facility];  Service: Endoscopy;  Laterality: N/A;  930  . None to date  10/07/15    Family History  Problem Relation Age of Onset  . COPD Father   . Stroke Brother   . Pulmonary embolism Sister   . Throat cancer Brother   . Arthritis Mother   . Diabetes Mother   . Stroke Mother   . Hypertension Brother   . Diabetes Brother   . Arthritis Brother   . Diabetes Sister   . Colon cancer Neg Hx     Social History:  reports that he has been smoking cigarettes. He started smoking about 36 years ago. He has a 30.00 pack-year smoking history. He has never used smokeless tobacco. He reports current alcohol use of about 3.0 standard drinks of alcohol per week. He reports previous drug use.  Allergies: No Known Allergies  Medications: Prior to Admission medications   Medication Sig Start Date End Date Taking? Authorizing Provider  amLODipine (NORVASC) 2.5 MG tablet Take 1 tablet (2.5 mg total) by mouth daily. Patient not taking: Reported on 09/07/2020 01/30/20   Ailene Ards, NP  rivaroxaban (XARELTO) 20 MG TABS tablet Take 1 tablet (20 mg total) by mouth daily with supper. Patient not taking: Reported on 09/07/2020 01/30/20   Ailene Ards, NP    Scheduled Meds: . atorvastatin  40 mg Oral Daily   Continuous Infusions: PRN Meds:.     Results for orders placed or performed during the hospital encounter of 09/07/20 (from the past 48 hour(s))  Respiratory Panel by RT PCR (Flu A&B, Covid) - Nasopharyngeal Swab     Status: None   Collection Time: 09/07/20  7:03 PM   Specimen: Nasopharyngeal Swab  Result Value Ref Range   SARS Coronavirus 2 by RT PCR NEGATIVE NEGATIVE    Comment: (NOTE) SARS-CoV-2 target nucleic acids are NOT DETECTED.  The SARS-CoV-2 RNA is generally detectable in upper respiratoy specimens during the acute phase of infection. The  lowest concentration of SARS-CoV-2 viral copies this assay can detect is 131 copies/mL. A negative result does not preclude SARS-Cov-2 infection and should not be used as the sole basis for treatment or other patient management decisions. A negative result may occur with  improper specimen collection/handling, submission of specimen other than nasopharyngeal swab, presence of viral mutation(s) within the areas targeted by this assay, and inadequate number of viral copies (<131 copies/mL). A negative result must be combined with clinical observations, patient history, and epidemiological information. The expected result is Negative.  Fact Sheet for Patients:  PinkCheek.be  Fact Sheet for Healthcare Providers:  GravelBags.it  This test is no t yet approved or cleared by the [place] FDA and  has been authorized for detection and/or diagnosis of SARS-CoV-2 by FDA under an Emergency Use Authorization (EUA). This EUA will remain  in effect (meaning this test can be used) for the duration of the COVID-19 declaration under Section 564(b)(1) of the Act, 21 U.S.C. section 360bbb-3(b)(1), unless the authorization  is terminated or revoked sooner.     Influenza A by PCR NEGATIVE NEGATIVE   Influenza B by PCR NEGATIVE NEGATIVE    Comment: (NOTE) The Xpert Xpress SARS-CoV-2/FLU/RSV assay is intended as an aid in  the diagnosis of influenza from Nasopharyngeal swab specimens and  should not be used as a sole basis for treatment. Nasal washings and  aspirates are unacceptable for Xpert Xpress SARS-CoV-2/FLU/RSV  testing.  Fact Sheet for Patients: PinkCheek.be  Fact Sheet for Healthcare Providers: GravelBags.it  This test is not yet approved or cleared by the [place] FDA and  has been authorized for detection and/or diagnosis of SARS-CoV-2 by  FDA under an Emergency  Use Authorization (EUA). This EUA will remain  in effect (meaning this test can be used) for the duration of the  Covid-19 declaration under Section 564(b)(1) of the Act, 21  U.S.C. section 360bbb-3(b)(1), unless the authorization is  terminated or revoked. Performed at [facility], [address], [place], [place] [address]   Troponin I (High Sensitivity)     Status: Abnormal   Collection Time: 09/07/20  8:08 PM  Result Value Ref Range   Troponin I (High Sensitivity) 57 (H) <18 ng/L    Comment: (NOTE) Elevated high sensitivity troponin I (hsTnI) values and significant  changes across serial measurements may suggest ACS but many other  chronic and acute conditions are known to elevate hsTnI results.  Refer to the "Links" section for chest pain algorithms and additional  guidance. Performed at [facility], [address] [place], [place] [address]   Troponin I (High Sensitivity)     Status: Abnormal   Collection Time: 09/07/20 11:29 PM  Result Value Ref Range   Troponin I (High Sensitivity) 60 (H) <18 ng/L    Comment: (NOTE) Elevated high sensitivity troponin I (hsTnI) values and significant  changes across serial measurements may suggest ACS but many other  chronic and acute conditions are known to elevate hsTnI results.  Refer to the "Links" section for chest pain algorithms and additional  guidance. Performed at [facility], [address] [place], [place] [address]   HIV Antibody (routine testing w rflx)     Status: None   Collection Time: 09/07/20 11:29 PM  Result Value Ref Range   HIV Screen 4th Generation wRfx Non Reactive Non Reactive    Comment: Performed at [facility], [place] [address] [place], [place] [address]  Troponin I (High Sensitivity)     Status: Abnormal   Collection Time: 09/08/20  1:56 AM  Result Value Ref Range   Troponin I (High Sensitivity) 62 (H) <18 ng/L    Comment: (NOTE) Elevated high sensitivity troponin I (hsTnI) values and significant   changes across serial measurements may suggest ACS but many other  chronic and acute conditions are known to elevate hsTnI results.  Refer to the "Links" section for chest pain algorithms and additional  guidance. Performed at [facility], [address] [place], [place] [address]   Lipid panel     Status: None   Collection Time: 09/08/20  4:29 AM  Result Value Ref Range   Cholesterol 168 0 - 200 mg/dL   Triglycerides 96 <150 mg/dL   HDL 66 >40 mg/dL   Total CHOL/HDL Ratio 2.5 RATIO   VLDL 19 0 - 40 mg/dL   LDL Cholesterol 83 0 - 99 mg/dL    Comment:        Total Cholesterol/HDL:CHD Risk Coronary Heart Disease Risk Table  Men   Women  1/2 Average Risk   3.4   3.3  Average Risk       5.0   4.4  2 X Average Risk   9.6   7.1  3 X Average Risk  23.4   11.0        Use the calculated Patient Ratio above and the CHD Risk Table to determine the patient's CHD Risk.        ATP III CLASSIFICATION (LDL):  <100     mg/dL   Optimal  100-129  mg/dL   Near or Above                    Optimal  130-159  mg/dL   Borderline  160-189  mg/dL   High  >190     mg/dL   Very High Performed at [place] [place], [place] [address]   Hemoglobin A1c     Status: None   Collection Time: 09/08/20  4:29 AM  Result Value Ref Range   Hgb A1c MFr Bld 5.3 4.8 - 5.6 %    Comment: (NOTE) Pre diabetes:          5.7%-6.4%  Diabetes:              >6.4%  Glycemic control for   <7.0% adults with diabetes    Mean Plasma Glucose 105.41 mg/dL    Comment: Performed at [facility] [place] [address] [place], [place] [address]  Comprehensive metabolic panel     Status: Abnormal   Collection Time: 09/08/20  4:29 AM  Result Value Ref Range   Sodium 138 135 - 145 mmol/L   Potassium 3.6 3.5 - 5.1 mmol/L   Chloride 104 98 - 111 mmol/L   CO2 25 22 - 32 mmol/L   Glucose, Bld 87 70 - 99 mg/dL    Comment: Glucose reference range applies only to samples taken after fasting  for at least 8 hours.   BUN 21 (H) 6 - 20 mg/dL   Creatinine, Ser 1.54 (H) 0.61 - 1.24 mg/dL   Calcium 9.2 8.9 - 10.3 mg/dL   Total Protein 7.1 6.5 - 8.1 g/dL   Albumin 3.7 3.5 - 5.0 g/dL   AST 22 15 - 41 U/L   ALT 20 0 - 44 U/L   Alkaline Phosphatase 68 38 - 126 U/L   Total Bilirubin 1.0 0.3 - 1.2 mg/dL   GFR, Estimated 49 (L) >60 mL/min   Anion gap 9 5 - 15    Comment: Performed at [facility], [address], [place], [place] [address]  CBC     Status: None   Collection Time: 09/08/20  4:29 AM  Result Value Ref Range   WBC 4.8 4.0 - 10.5 K/uL   RBC 4.96 4.22 - 5.81 MIL/uL   Hemoglobin 14.7 13.0 - 17.0 g/dL   HCT 42.7 39 - 52 %   MCV 86.1 80.0 - 100.0 fL   MCH 29.6 26.0 - 34.0 pg   MCHC 34.4 30.0 - 36.0 g/dL   RDW 14.4 11.5 - 15.5 %   Platelets 178 150 - 400 K/uL   nRBC 0.0 0.0 - 0.2 %    Comment: Performed at [facility], [address] [place], [place] [address]  Protime-INR     Status: None   Collection Time: 09/08/20  4:29 AM  Result Value Ref Range   Prothrombin Time 13.2 11.4 - 15.2 seconds  INR 1.0 0.8 - 1.2    Comment: (NOTE) INR goal varies based on device and disease states. Performed at [facility], [address], [place], [place] [address]   APTT     Status: None   Collection Time: 09/08/20  4:29 AM  Result Value Ref Range   aPTT 29 24 - 36 seconds    Comment: Performed at [facility], [address], [place], [place] [address]  Magnesium     Status: None   Collection Time: 09/08/20  4:29 AM  Result Value Ref Range   Magnesium 2.3 1.7 - 2.4 mg/dL    Comment: Performed at [facility], [address], [place], [place] [address]  Phosphorus     Status: None   Collection Time: 09/08/20  4:29 AM  Result Value Ref Range   Phosphorus 3.7 2.5 - 4.6 mg/dL    Comment: Performed at [facility], [address] [place], [place] [address]  Troponin I (High Sensitivity)     Status: Abnormal   Collection Time: 09/09/20  1:25 AM  Result Value Ref Range    Troponin I (High Sensitivity) 66 (H) <18 ng/L    Comment: (NOTE) Elevated high sensitivity troponin I (hsTnI) values and significant  changes across serial measurements may suggest ACS but many other  chronic and acute conditions are known to elevate hsTnI results.  Refer to the "Links" section for chest pain algorithms and additional  guidance. Performed at [facility], [address] [place], [place] [address]   Basic metabolic panel     Status: Abnormal   Collection Time: 09/09/20  1:26 AM  Result Value Ref Range   Sodium 136 135 - 145 mmol/L   Potassium 3.3 (L) 3.5 - 5.1 mmol/L   Chloride 102 98 - 111 mmol/L   CO2 27 22 - 32 mmol/L   Glucose, Bld 137 (H) 70 - 99 mg/dL    Comment: Glucose reference range applies only to samples taken after fasting for at least 8 hours.   BUN 19 6 - 20 mg/dL   Creatinine, Ser 1.54 (H) 0.61 - 1.24 mg/dL   Calcium 8.8 (L) 8.9 - 10.3 mg/dL   GFR, Estimated 49 (L) >60 mL/min   Anion gap 7 5 - 15    Comment: Performed at [facility], [address] [place],  [address]    Studies/Results:   HEAD NECK CTA FINDINGS: CTA NECK FINDINGS  Aortic arch: Visualized aortic arch of normal caliber with normal branch pattern. No hemodynamically significant stenosis seen about the origin of the great vessels.  Right carotid system: Right CCA widely patent from its origin to the bifurcation. Mild for age atheromatous change about the right bifurcation without hemodynamically significant stenosis. Right ICA widely patent distally without stenosis, dissection or occlusion.  Left carotid system: Left CCA widely patent from its origin to the bifurcation without stenosis. Mild for age atheromatous change about the left bifurcation without significant stenosis. Left ICA widely patent distally to the skull base without stenosis, dissection or occlusion.  Vertebral arteries: Both vertebral arteries arise from the subclavian arteries. No proximal  subclavian artery stenosis. Vertebral arteries are markedly diminutive in hypoplastic bilaterally, but remain patent without stenosis, dissection or occlusion.  Skeleton: No acute osseous abnormality. No discrete or worrisome osseous lesions. Prominent degenerative changes noted about the C1-2 articulation.  Other neck: No other acute soft tissue abnormality within the neck. 1.5 cm well-circumscribed round cystic lesion within the subcutaneous fat at the right submandibular region  noted (series 4, image 55), favored to reflect a sebaceous cyst. No adenopathy elsewhere within the neck. 1.8 cm left thyroid nodule (series 4, image 44). Few additional scattered subcentimeter nodules noted as well.  Upper chest: Visualized upper chest demonstrates no acute finding.  Review of the MIP images confirms the above findings  CTA HEAD FINDINGS  Anterior circulation: Examination limited by motion artifact.  Both internal carotid arteries widely patent to the termini without stenosis or other abnormality. A1 segments widely patent. Normal anterior communicating artery complex. Anterior cerebral arteries grossly patent to their distal aspects without appreciable stenosis. No M1 stenosis or occlusion. Negative MCA bifurcations. No visible proximal MCA branch occlusion. Distal MCA branches grossly perfused and symmetric.  Posterior circulation: Vertebral arteries patent to the vertebrobasilar junction without stenosis. Left PICA patent the right vertebral artery essentially primarily supplies a common right PICA/AICA origin, immediately dividing from the vertebrobasilar junction, and contributing little to the remainder of the posterior circulation. The basilar artery is markedly diminutive in hypoplastic distally, and not well seen, but grossly patent. Superior cerebral arteries are perfused bilaterally. Fetal type origin of both PCAs supplied via a robust bilateral  posterior communicating arteries. Both PCAs perfused to their distal aspects without stenosis.  Venous sinuses: Patent.  Anatomic variants: Fetal type origin of the PCAs with markedly diminutive vertebrobasilar system. No aneurysm.  Review of the MIP images confirms the above findings  IMPRESSION: 1. Negative CTA for emergent large vessel occlusion. 2. Mild for age atheromatous change about the carotid bifurcations without hemodynamically significant stenosis. 3. Fetal type origin of the PCAs with secondary markedly diminutive vertebrobasilar system. 4. 1.8 cm left thyroid nodule, indeterminate. Further evaluation with dedicated thyroid ultrasound recommended. This could be performed on a nonemergent outpatient basis. (ref: J Am Coll Radiol. 2015 Feb;12(2): 143-50).     BRAIN MRI FINDINGS: Brain: There are many small infarcts both supratentorially and infratentorially. Sing close small infarcts within the right cerebellum, bilateral occipital lobes, bilateral frontal lobes and the left parietal and temporal lobes. Mild associated edema without substantial mass effect. No midline shift. Basal cisterns are patent. Additional superimposed T2/FLAIR hyperintensities, compatible with chronic microvascular ischemic disease. No hydrocephalus. No mass lesion.  Vascular: Poor visualization of the basilar artery flow void, which correlates with fetal type PCAs bilaterally and diminutive vertebrobasilar system seen on recent CTA.  Skull and upper cervical spine: Degenerative changes at the craniocervical junction with pannus posterior to the dens but no high-grade canal stenosis.  Sinuses/Orbits: Right maxillary retention cyst. Mild ethmoid air cell mucosal thickening. Otherwise, sinuses are clear. No acute orbital abnormality.  Other: No mastoid effusions.  IMPRESSION: Many small supratentorial and infratentorial acute/early subacute infarcts, as above. Involvement  of multiple vascular territories suggests a central embolic source. Mild associated edema without substantial mass effect.       The brain MRI scan is reviewed in person in shows a few area of tiny increased on DWI involving the supratentorial areas bilaterally essentially approximating the watershed distribution anteriorly and posteriorly including the  Parasagittal frontal area and the parietal areas. There is also similar findings involving the inferior cerebellum on the right side. There a few areas of small reduced signal on T1 indicating  Likely small acute infarcts involving the left pontine region and inferior cerebellum bilaterally especially the left side. No hemorrhages noted.   Jerzy Crotteau A. Merlene Laughter, M.D.  Diplomate, Tax adviser of Psychiatry and Neurology ( Neurology). 09/09/2020, 6:24 PM

## 2020-09-09 NOTE — Progress Notes (Addendum)
PROGRESS NOTE    Matthew Wise  TMH:[id number] DOB: October 25, 1961 DOA: 09/07/2020 PCP: Doree Albee, MD   Brief Narrative:  This 59 years old male with medical history significant for hypertension, bilateral lower extremity DVT and PE with noncompliance who presents to the emergency department due to sudden onset of dizziness associated with blurred vision and numbness in his left hand fingers. Numbness and tingling resolved after about 15 to 20 minutes, but the dizziness and blurred vision lasted few hours and did not resolve until after he arrived at the ED.  CT head without contrast showed no acute intracranial findings.  Patient is admitted for dizziness and numbness, to rule out TIA versus CVA. Patient underwent MRI which showed multiple small supratentorial and infratentorial acute/ subacute infarcts.  Cardiology recommended TEE to rule out embolic source.   Assessment & Plan:   Principal Problem:   Transient ischemic attack Active Problems:   DVT (deep venous thrombosis) (HCC)   Pulmonary embolism (HCC)   CKD (chronic kidney disease) stage 3, GFR 30-59 ml/min (HCC)   History of hypertension   Elevated troponin   Dizziness   Numbness and tingling   H/O medication noncompliance   Acute CVA (cerebrovascular accident) ([facility])  Dizziness / Numbness and tingling, R/O TIA vs CVA: Patient reports back to baseline, symptoms has resolved. CT of head without contrast showed: No acute intracranial findings Admitted to telemetry unit  Obtain CT angiography of head and neck with contrast Echocardiogram : Left ventricular ejection fraction 55 to 60%.  No regional wall motion abnormalities. MRI of brain without contrast : Multiple small supratentorial and infratentorial acute subacute infarcts. Continue fall precautions and neuro checks Hb A1C 5.6, Lipid profile  : LDL 96 , LDL goal below 70 Continue PT/ST/OT eval and treat Bedside swallow eval by nursing prior to diet. Cardiology  recommended TEE to rule out embolic source.  Hx. Of  DVT /PE: Patient has not been compliant with Xarelto. This is temporarily held due to strokelike symptoms.  Elevated troponin possibly secondary to type II demand ischemia: Troponin 46 > 57, 60> 62. Patient denies any chest pain. No changes noted from prior EKG. Cardiology was consulted due to significant bradycardia, HR 37 while sleeping. Recommended TEE to rule out embolic source.  History of hypertension: Patient denies history of hypertension, though amlodipine 2.5 mg x 1 daily was noted in his med rec. BP was elevated at 165/109 on arrival to the ED Permissive hypertension will be allowed at this time with plan to only treat BP when it's > 220/120  CKD stage III:  Stable; renally adjust medications, avoid nephrotoxic agents/dehydration/hypotension.  History of medication noncompliance Importance of medication compliance was discussed with patient, and he verbalized understanding the discussion.  Obesity (BMI 37.30) Patient was counseled on diet and exercise Patient will need to follow-up with outpatient PCP for weight loss program  DVT prophylaxis: SCDs Code Status: Full Family Communication: No one at bed side. Disposition Plan:  Status is: Inpatient  Remains inpatient appropriate because:Inpatient level of care appropriate due to severity of illness   Dispo: The patient is from: Home              Anticipated d/c is to: Home              Anticipated d/c date is: 1 day              Patient currently is not medically stable to d/c.   Barriers to discharge: Ongoing  work-up for stroke  Consultants:   Neurology, cardiology  Procedures:  Antimicrobials:  Anti-infectives (From admission, onward)   None      Subjective: Patient was seen and examined at bedside.  Overnight events noted.  His symptoms completely resolved.  His dizziness has resolved.  Objective: Vitals:   09/09/20 0129 09/09/20 0609  09/09/20 0930 09/09/20 1330  BP: (!) 152/77 (!) 146/80 (!) 165/90 (!) 149/80  Pulse: (!) 52 (!) 48 (!) 50 (!) 56  Resp: 16 15 18 18   Temp: 98 F (36.7 C) 98.4 F (36.9 C) 98.1 F (36.7 C) 98.8 F (37.1 C)  TempSrc:    Oral  SpO2: 98% 99% 100% 100%  Weight:      Height:       No intake or output data in the 24 hours ending 09/09/20 1456 Filed Weights   09/07/20 1650 09/08/20 2114  Weight: 124.7 kg 120.5 kg    Examination:  General exam: Appears calm and comfortable  Respiratory system: Clear to auscultation. Respiratory effort normal. Cardiovascular system: S1 & S2 heard, RRR. No JVD, murmurs, rubs, gallops or clicks. No pedal edema. Gastrointestinal system: Abdomen is nondistended, soft and nontender. No organomegaly or masses felt. Normal bowel sounds heard. Central nervous system: Alert and oriented. No focal neurological deficits. Extremities: Symmetric 5 x 5 power. Skin: No rashes, lesions or ulcers Psychiatry: Judgement and insight appear normal. Mood & affect appropriate.     Data Reviewed: I have personally reviewed following labs and imaging studies  CBC: Recent Labs  Lab 09/07/20 1811 09/08/20 0429  WBC 4.9 4.8  NEUTROABS 2.7  --   HGB 15.3 14.7  HCT 44.3 42.7  MCV 87.0 86.1  PLT 190 242   Basic Metabolic Panel: Recent Labs  Lab 09/07/20 1811 09/08/20 0429 09/09/20 0126  NA 138 138 136  K 4.3 3.6 3.3*  CL 104 104 102  CO2 25 25 27   GLUCOSE 92 87 137*  BUN 20 21* 19  CREATININE 1.65* 1.54* 1.54*  CALCIUM 9.2 9.2 8.8*  MG  --  2.3  --   PHOS  --  3.7  --    GFR: Estimated Creatinine Clearance: 71 mL/min (A) (by C-G formula based on SCr of 1.54 mg/dL (H)). Liver Function Tests: Recent Labs  Lab 09/07/20 1811 09/08/20 0429  AST 21 22  ALT 20 20  ALKPHOS 70 68  BILITOT 1.2 1.0  PROT 7.6 7.1  ALBUMIN 4.0 3.7   No results for input(s): LIPASE, AMYLASE in the last 168 hours. No results for input(s): AMMONIA in the last 168  hours. Coagulation Profile: Recent Labs  Lab 09/07/20 1811 09/08/20 0429  INR 1.0 1.0   Cardiac Enzymes: No results for input(s): CKTOTAL, CKMB, CKMBINDEX, TROPONINI in the last 168 hours. BNP (last 3 results) No results for input(s): PROBNP in the last 8760 hours. HbA1C: Recent Labs    09/08/20 0429  HGBA1C 5.3   CBG: Recent Labs  Lab 09/07/20 1718  GLUCAP 103*   Lipid Profile: Recent Labs    09/08/20 0429  CHOL 168  HDL 66  LDLCALC 83  TRIG 96  CHOLHDL 2.5   Thyroid Function Tests: No results for input(s): TSH, T4TOTAL, FREET4, T3FREE, THYROIDAB in the last 72 hours. Anemia Panel: No results for input(s): VITAMINB12, FOLATE, FERRITIN, TIBC, IRON, RETICCTPCT in the last 72 hours. Sepsis Labs: No results for input(s): PROCALCITON, LATICACIDVEN in the last 168 hours.  Recent Results (from the past 240 hour(s))  Respiratory Panel  by RT PCR (Flu A&B, Covid) - Nasopharyngeal Swab     Status: None   Collection Time: 09/07/20  7:03 PM   Specimen: Nasopharyngeal Swab  Result Value Ref Range Status   SARS Coronavirus 2 by RT PCR NEGATIVE NEGATIVE Final    Comment: (NOTE) SARS-CoV-2 target nucleic acids are NOT DETECTED.  The SARS-CoV-2 RNA is generally detectable in upper respiratoy specimens during the acute phase of infection. The lowest concentration of SARS-CoV-2 viral copies this assay can detect is 131 copies/mL. A negative result does not preclude SARS-Cov-2 infection and should not be used as the sole basis for treatment or other patient management decisions. A negative result may occur with  improper specimen collection/handling, submission of specimen other than nasopharyngeal swab, presence of viral mutation(s) within the areas targeted by this assay, and inadequate number of viral copies (<131 copies/mL). A negative result must be combined with clinical observations, patient history, and epidemiological information. The expected result is  Negative.  Fact Sheet for Patients:  PinkCheek.be  Fact Sheet for Healthcare Providers:  GravelBags.it  This test is no t yet approved or cleared by the [place] FDA and  has been authorized for detection and/or diagnosis of SARS-CoV-2 by FDA under an Emergency Use Authorization (EUA). This EUA will remain  in effect (meaning this test can be used) for the duration of the COVID-19 declaration under Section 564(b)(1) of the Act, 21 U.S.C. section 360bbb-3(b)(1), unless the authorization is terminated or revoked sooner.     Influenza A by PCR NEGATIVE NEGATIVE Final   Influenza B by PCR NEGATIVE NEGATIVE Final    Comment: (NOTE) The Xpert Xpress SARS-CoV-2/FLU/RSV assay is intended as an aid in  the diagnosis of influenza from Nasopharyngeal swab specimens and  should not be used as a sole basis for treatment. Nasal washings and  aspirates are unacceptable for Xpert Xpress SARS-CoV-2/FLU/RSV  testing.  Fact Sheet for Patients: PinkCheek.be  Fact Sheet for Healthcare Providers: GravelBags.it  This test is not yet approved or cleared by the [place] FDA and  has been authorized for detection and/or diagnosis of SARS-CoV-2 by  FDA under an Emergency Use Authorization (EUA). This EUA will remain  in effect (meaning this test can be used) for the duration of the  Covid-19 declaration under Section 564(b)(1) of the Act, 21  U.S.C. section 360bbb-3(b)(1), unless the authorization is  terminated or revoked. Performed at [facility], [address] [place], [place] [address]     Radiology Studies: CT Head Wo Contrast  Result Date: 09/07/2020 CLINICAL DATA:  Dizziness for 1 hour with transient tingling in the left fingers and lips. Stroke suspected. EXAM: CT HEAD WITHOUT CONTRAST TECHNIQUE: Contiguous axial images were obtained from the base of the skull  through the vertex without intravenous contrast. COMPARISON:  None. FINDINGS: Brain: There is no evidence of acute intracranial hemorrhage, mass lesion, brain edema or extra-axial fluid collection. The ventricles and subarachnoid spaces are appropriately sized for age. There is no CT evidence of acute cortical infarction. Vascular:  No hyperdense vessel identified. Skull: Negative for fracture or focal lesion. Sinuses/Orbits: Mucous retention cyst or polyp medially in the right maxillary sinus with partial ethmoid sinus opacification on the right. The additional visualized paranasal sinuses, mastoid air cells and middle ears are clear. No orbital abnormalities. Other: None. IMPRESSION: 1. No acute intracranial findings. 2. Mild right maxillary and ethmoid sinus disease as described. Electronically Signed   By: Richardean Sale M.D.   On: 09/07/2020 18:03  MR BRAIN WO CONTRAST  Result Date: 09/09/2020 CLINICAL DATA:  Neuro deficit, acute stroke suspected. Weakness and headache for 3 days. EXAM: MRI HEAD WITHOUT CONTRAST TECHNIQUE: Multiplanar, multiecho pulse sequences of the brain and surrounding structures were obtained without intravenous contrast. COMPARISON:  CT exams from 09/07/2020 FINDINGS: Brain: There are many small infarcts both supratentorially and infratentorially. Sing close small infarcts within the right cerebellum, bilateral occipital lobes, bilateral frontal lobes and the left parietal and temporal lobes. Mild associated edema without substantial mass effect. No midline shift. Basal cisterns are patent. Additional superimposed T2/FLAIR hyperintensities, compatible with chronic microvascular ischemic disease. No hydrocephalus. No mass lesion. Vascular: Poor visualization of the basilar artery flow void, which correlates with fetal type PCAs bilaterally and diminutive vertebrobasilar system seen on recent CTA. Skull and upper cervical spine: Degenerative changes at the craniocervical junction  with pannus posterior to the dens but no high-grade canal stenosis. Sinuses/Orbits: Right maxillary retention cyst. Mild ethmoid air cell mucosal thickening. Otherwise, sinuses are clear. No acute orbital abnormality. Other: No mastoid effusions. IMPRESSION: Many small supratentorial and infratentorial acute/early subacute infarcts, as above. Involvement of multiple vascular territories suggests a central embolic source. Mild associated edema without substantial mass effect. Electronically Signed   By: Margaretha Sheffield MD   On: 09/09/2020 10:03   ECHOCARDIOGRAM COMPLETE  Result Date: 09/08/2020    ECHOCARDIOGRAM REPORT   Patient Name:   Matthew Wise Date of Exam: 09/08/2020 Medical Rec #:  [id number]       Height:       72.0 in Accession #:    [id number]      Weight:       275.0 lb Date of Birth:  06/22/1961      BSA:          2.439 m Patient Age:    56 years        BP:           135/83 mmHg Patient Gender: M               HR:           48 bpm. Exam Location:  Inpatient Procedure: 2D Echo Indications:    Stroke I163.9  History:        Patient has no prior history of Echocardiogram examinations. H/o                 Pulmonary Embolus; Risk Factors:Hypertension.  Sonographer:    Mikki Santee RDCS (AE) Referring Phys: [id number] OLADAPO ADEFESO IMPRESSIONS  1. Left ventricular ejection fraction, by estimation, is 55 to 60%. The left ventricle has normal function. The left ventricle has no regional wall motion abnormalities. Left ventricular diastolic parameters are indeterminate.  2. Right ventricular systolic function is normal. The right ventricular size is normal.  3. Left atrial size was mildly dilated.  4. The mitral valve is grossly normal. Trivial mitral valve regurgitation.  5. The aortic valve is grossly normal. Aortic valve regurgitation is trivial.  6. The inferior vena cava is normal in size with greater than 50% respiratory variability, suggesting right atrial pressure of 3 mmHg. Comparison(s):  No prior Echocardiogram. FINDINGS  Left Ventricle: Concentric remodeling noted. Diastology suggestive of elevated filling pressures. Left ventricular ejection fraction, by estimation, is 55 to 60%. The left ventricle has normal function. The left ventricle has no regional wall motion abnormalities. The left ventricular internal cavity size was normal in size. There is no left ventricular hypertrophy. Left ventricular diastolic parameters are  indeterminate. Right Ventricle: The right ventricular size is normal. No increase in right ventricular wall thickness. Right ventricular systolic function is normal. Left Atrium: Left atrial size was mildly dilated. Right Atrium: Right atrial size was normal in size. Pericardium: There is no evidence of pericardial effusion. Mitral Valve: The mitral valve is grossly normal. Trivial mitral valve regurgitation. Tricuspid Valve: The tricuspid valve is grossly normal. Tricuspid valve regurgitation is trivial. Aortic Valve: The aortic valve is grossly normal. Aortic valve regurgitation is trivial. Pulmonic Valve: The pulmonic valve was not well visualized. Pulmonic valve regurgitation is not visualized. Aorta: The aortic root and ascending aorta are structurally normal, with no evidence of dilitation. Venous: The inferior vena cava is normal in size with greater than 50% respiratory variability, suggesting right atrial pressure of 3 mmHg. IAS/Shunts: The atrial septum is grossly normal.  LEFT VENTRICLE PLAX 2D LVIDd:         5.59 cm  Diastology LVIDs:         3.51 cm  LV e' medial:    4.90 cm/s LV PW:         1.20 cm  LV E/e' medial:  13.3 LV IVS:        1.16 cm  LV e' lateral:   4.46 cm/s LVOT diam:     2.30 cm  LV E/e' lateral: 14.6 LV SV:         111 LV SV Index:   45 LVOT Area:     4.15 cm  RIGHT VENTRICLE RV S prime:     12.00 cm/s TAPSE (M-mode): 2.5 cm LEFT ATRIUM             Index       RIGHT ATRIUM           Index LA diam:        3.60 cm 1.48 cm/m  RA Area:     23.40 cm  LA Vol (A2C):   83.5 ml 34.23 ml/m RA Volume:   77.10 ml  31.61 ml/m LA Vol (A4C):   60.4 ml 24.76 ml/m LA Biplane Vol: 71.2 ml 29.19 ml/m  AORTIC VALVE LVOT Vmax:   103.00 cm/s LVOT Vmean:  63.100 cm/s LVOT VTI:    0.266 m  AORTA Ao Root diam: 3.10 cm Ao Asc diam:  3.30 cm MITRAL VALVE MV Area (PHT): 1.98 cm    SHUNTS MV Decel Time: 384 msec    Systemic VTI:  0.27 m MV E velocity: 65.10 cm/s  Systemic Diam: 2.30 cm MV A velocity: 65.10 cm/s MV E/A ratio:  1.00 Rudean Haskell MD Electronically signed by Rudean Haskell MD Signature Date/Time: 09/08/2020/1:44:47 PM    Final    CT ANGIO HEAD CODE STROKE  Result Date: 09/07/2020 CLINICAL DATA:  Initial evaluation for acute neuro deficit, stroke suspected. Acute dizziness for 1 hour with left-sided tingling. EXAM: CT ANGIOGRAPHY HEAD AND NECK TECHNIQUE: Multidetector CT imaging of the head and neck was performed using the standard protocol during bolus administration of intravenous contrast. Multiplanar CT image reconstructions and MIPs were obtained to evaluate the vascular anatomy. Carotid stenosis measurements (when applicable) are obtained utilizing NASCET criteria, using the distal internal carotid diameter as the denominator. CONTRAST:  41mL OMNIPAQUE IOHEXOL 350 MG/ML SOLN COMPARISON:  Prior head CT from earlier the same day. FINDINGS: CTA NECK FINDINGS Aortic arch: Visualized aortic arch of normal caliber with normal branch pattern. No hemodynamically significant stenosis seen about the origin of the great vessels. Right carotid system: Right CCA  widely patent from its origin to the bifurcation. Mild for age atheromatous change about the right bifurcation without hemodynamically significant stenosis. Right ICA widely patent distally without stenosis, dissection or occlusion. Left carotid system: Left CCA widely patent from its origin to the bifurcation without stenosis. Mild for age atheromatous change about the left bifurcation without  significant stenosis. Left ICA widely patent distally to the skull base without stenosis, dissection or occlusion. Vertebral arteries: Both vertebral arteries arise from the subclavian arteries. No proximal subclavian artery stenosis. Vertebral arteries are markedly diminutive in hypoplastic bilaterally, but remain patent without stenosis, dissection or occlusion. Skeleton: No acute osseous abnormality. No discrete or worrisome osseous lesions. Prominent degenerative changes noted about the C1-2 articulation. Other neck: No other acute soft tissue abnormality within the neck. 1.5 cm well-circumscribed round cystic lesion within the subcutaneous fat at the right submandibular region noted (series 4, image 55), favored to reflect a sebaceous cyst. No adenopathy elsewhere within the neck. 1.8 cm left thyroid nodule (series 4, image 44). Few additional scattered subcentimeter nodules noted as well. Upper chest: Visualized upper chest demonstrates no acute finding. Review of the MIP images confirms the above findings CTA HEAD FINDINGS Anterior circulation: Examination limited by motion artifact. Both internal carotid arteries widely patent to the termini without stenosis or other abnormality. A1 segments widely patent. Normal anterior communicating artery complex. Anterior cerebral arteries grossly patent to their distal aspects without appreciable stenosis. No M1 stenosis or occlusion. Negative MCA bifurcations. No visible proximal MCA branch occlusion. Distal MCA branches grossly perfused and symmetric. Posterior circulation: Vertebral arteries patent to the vertebrobasilar junction without stenosis. Left PICA patent the right vertebral artery essentially primarily supplies a common right PICA/AICA origin, immediately dividing from the vertebrobasilar junction, and contributing little to the remainder of the posterior circulation. The basilar artery is markedly diminutive in hypoplastic distally, and not well seen, but  grossly patent. Superior cerebral arteries are perfused bilaterally. Fetal type origin of both PCAs supplied via a robust bilateral posterior communicating arteries. Both PCAs perfused to their distal aspects without stenosis. Venous sinuses: Patent. Anatomic variants: Fetal type origin of the PCAs with markedly diminutive vertebrobasilar system. No aneurysm. Review of the MIP images confirms the above findings IMPRESSION: 1. Negative CTA for emergent large vessel occlusion. 2. Mild for age atheromatous change about the carotid bifurcations without hemodynamically significant stenosis. 3. Fetal type origin of the PCAs with secondary markedly diminutive vertebrobasilar system. 4. 1.8 cm left thyroid nodule, indeterminate. Further evaluation with dedicated thyroid ultrasound recommended. This could be performed on a nonemergent outpatient basis. (ref: J Am Coll Radiol. 2015 Feb;12(2): 143-50). Electronically Signed   By: Jeannine Boga M.D.   On: 09/07/2020 23:45   CT ANGIO NECK CODE STROKE  Result Date: 09/07/2020 CLINICAL DATA:  Initial evaluation for acute neuro deficit, stroke suspected. Acute dizziness for 1 hour with left-sided tingling. EXAM: CT ANGIOGRAPHY HEAD AND NECK TECHNIQUE: Multidetector CT imaging of the head and neck was performed using the standard protocol during bolus administration of intravenous contrast. Multiplanar CT image reconstructions and MIPs were obtained to evaluate the vascular anatomy. Carotid stenosis measurements (when applicable) are obtained utilizing NASCET criteria, using the distal internal carotid diameter as the denominator. CONTRAST:  47mL OMNIPAQUE IOHEXOL 350 MG/ML SOLN COMPARISON:  Prior head CT from earlier the same day. FINDINGS: CTA NECK FINDINGS Aortic arch: Visualized aortic arch of normal caliber with normal branch pattern. No hemodynamically significant stenosis seen about the origin of the great vessels. Right carotid  system: Right CCA widely patent from  its origin to the bifurcation. Mild for age atheromatous change about the right bifurcation without hemodynamically significant stenosis. Right ICA widely patent distally without stenosis, dissection or occlusion. Left carotid system: Left CCA widely patent from its origin to the bifurcation without stenosis. Mild for age atheromatous change about the left bifurcation without significant stenosis. Left ICA widely patent distally to the skull base without stenosis, dissection or occlusion. Vertebral arteries: Both vertebral arteries arise from the subclavian arteries. No proximal subclavian artery stenosis. Vertebral arteries are markedly diminutive in hypoplastic bilaterally, but remain patent without stenosis, dissection or occlusion. Skeleton: No acute osseous abnormality. No discrete or worrisome osseous lesions. Prominent degenerative changes noted about the C1-2 articulation. Other neck: No other acute soft tissue abnormality within the neck. 1.5 cm well-circumscribed round cystic lesion within the subcutaneous fat at the right submandibular region noted (series 4, image 55), favored to reflect a sebaceous cyst. No adenopathy elsewhere within the neck. 1.8 cm left thyroid nodule (series 4, image 44). Few additional scattered subcentimeter nodules noted as well. Upper chest: Visualized upper chest demonstrates no acute finding. Review of the MIP images confirms the above findings CTA HEAD FINDINGS Anterior circulation: Examination limited by motion artifact. Both internal carotid arteries widely patent to the termini without stenosis or other abnormality. A1 segments widely patent. Normal anterior communicating artery complex. Anterior cerebral arteries grossly patent to their distal aspects without appreciable stenosis. No M1 stenosis or occlusion. Negative MCA bifurcations. No visible proximal MCA branch occlusion. Distal MCA branches grossly perfused and symmetric. Posterior circulation: Vertebral arteries  patent to the vertebrobasilar junction without stenosis. Left PICA patent the right vertebral artery essentially primarily supplies a common right PICA/AICA origin, immediately dividing from the vertebrobasilar junction, and contributing little to the remainder of the posterior circulation. The basilar artery is markedly diminutive in hypoplastic distally, and not well seen, but grossly patent. Superior cerebral arteries are perfused bilaterally. Fetal type origin of both PCAs supplied via a robust bilateral posterior communicating arteries. Both PCAs perfused to their distal aspects without stenosis. Venous sinuses: Patent. Anatomic variants: Fetal type origin of the PCAs with markedly diminutive vertebrobasilar system. No aneurysm. Review of the MIP images confirms the above findings IMPRESSION: 1. Negative CTA for emergent large vessel occlusion. 2. Mild for age atheromatous change about the carotid bifurcations without hemodynamically significant stenosis. 3. Fetal type origin of the PCAs with secondary markedly diminutive vertebrobasilar system. 4. 1.8 cm left thyroid nodule, indeterminate. Further evaluation with dedicated thyroid ultrasound recommended. This could be performed on a nonemergent outpatient basis. (ref: J Am Coll Radiol. 2015 Feb;12(2): 143-50). Electronically Signed   By: Jeannine Boga M.D.   On: 09/07/2020 23:45   Scheduled Meds: Continuous Infusions:   LOS: 0 days    Time spent: 35 mins    Aniyiah Zell, MD [facility]   If 7PM-7AM, please contact night-coverage

## 2020-09-09 NOTE — Progress Notes (Signed)
SLP Cancellation Note  Patient Details Name: Matthew Wise MRN: [id number] DOB: [birth year]   Cancelled treatment:       Reason Eval/Treat Not Completed: SLP screened, no needs identified, will sign off; SLP screened Pt in room. Pt denies any changes in swallowing, speech, language, or cognition. SLE will be deferred at this time. Reconsult if indicated. SLP will sign off.   Thank you,  Genene Churn, [place]  [place] 09/09/2020, 10:45 AM

## 2020-09-10 ENCOUNTER — Inpatient Hospital Stay (HOSPITAL_COMMUNITY): Payer: Medicare Other | Admitting: Anesthesiology

## 2020-09-10 ENCOUNTER — Encounter (HOSPITAL_COMMUNITY): Payer: Self-pay | Admitting: Family Medicine

## 2020-09-10 ENCOUNTER — Inpatient Hospital Stay (HOSPITAL_COMMUNITY): Payer: Medicare Other

## 2020-09-10 ENCOUNTER — Encounter (HOSPITAL_COMMUNITY)
Admission: EM | Disposition: A | Discharge status: Home / Self Care | Payer: Medicare Other | Source: Home / Self Care | Attending: Family Medicine

## 2020-09-10 DIAGNOSIS — G459 Transient cerebral ischemic attack, unspecified: Secondary | ICD-10-CM

## 2020-09-10 DIAGNOSIS — I2602 Saddle embolus of pulmonary artery with acute cor pulmonale: Secondary | ICD-10-CM | POA: Diagnosis not present

## 2020-09-10 DIAGNOSIS — I361 Nonrheumatic tricuspid (valve) insufficiency: Secondary | ICD-10-CM

## 2020-09-10 DIAGNOSIS — Z8679 Personal history of other diseases of the circulatory system: Secondary | ICD-10-CM

## 2020-09-10 DIAGNOSIS — I340 Nonrheumatic mitral (valve) insufficiency: Secondary | ICD-10-CM

## 2020-09-10 DIAGNOSIS — I82403 Acute embolism and thrombosis of unspecified deep veins of lower extremity, bilateral: Secondary | ICD-10-CM | POA: Diagnosis not present

## 2020-09-10 DIAGNOSIS — I351 Nonrheumatic aortic (valve) insufficiency: Secondary | ICD-10-CM

## 2020-09-10 DIAGNOSIS — I2782 Chronic pulmonary embolism: Secondary | ICD-10-CM

## 2020-09-10 HISTORY — PX: TEE WITHOUT CARDIOVERSION: SHX5443

## 2020-09-10 LAB — BASIC METABOLIC PANEL
Anion gap: 7 (ref 5–15)
BUN: 16 mg/dL (ref 6–20)
CO2: 27 mmol/L (ref 22–32)
Calcium: 9.1 mg/dL (ref 8.9–10.3)
Chloride: 103 mmol/L (ref 98–111)
Creatinine, Ser: 1.63 mg/dL — ABNORMAL HIGH (ref 0.61–1.24)
GFR, Estimated: 46 mL/min — ABNORMAL LOW (ref >60)
Glucose, Bld: 90 mg/dL (ref 70–99)
Potassium: 4 mmol/L (ref 3.5–5.1)
Sodium: 137 mmol/L (ref 135–145)

## 2020-09-10 SURGERY — ECHOCARDIOGRAM, TRANSESOPHAGEAL
Anesthesia: General

## 2020-09-10 MED ORDER — LACTATED RINGERS IV SOLN: Freq: Once | INTRAVENOUS | Status: AC

## 2020-09-10 MED ORDER — GLYCOPYRROLATE 0.2 MG/ML IJ SOLN
INTRAMUSCULAR | Status: DC | PRN
  Administered 2020-09-10: .2 mg via INTRAVENOUS

## 2020-09-10 MED ORDER — SODIUM CHLORIDE 0.9 % IV SOLN: INTRAVENOUS | Status: DC

## 2020-09-10 MED ORDER — PROPOFOL 10 MG/ML IV BOLUS
INTRAVENOUS | Status: AC
  Filled 2020-09-10: qty 40

## 2020-09-10 MED ORDER — RIVAROXABAN 20 MG PO TABS: 20.0000 mg | ORAL_TABLET | Freq: Every day | ORAL | Status: DC

## 2020-09-10 MED ORDER — LIDOCAINE VISCOUS HCL 2 % MT SOLN
15.0000 mL | Freq: Once | OROMUCOSAL | Status: DC
  Filled 2020-09-10: qty 15

## 2020-09-10 MED ORDER — POLYETHYLENE GLYCOL 3350 17 G PO PACK
17.0000 g | PACK | Freq: Every day | ORAL | Status: DC
  Filled 2020-09-10: qty 1

## 2020-09-10 MED ORDER — CHLORHEXIDINE GLUCONATE 0.12 % MT SOLN
15.0000 mL | Freq: Once | OROMUCOSAL | Status: AC
  Administered 2020-09-10: 15 mL via OROMUCOSAL

## 2020-09-10 MED ORDER — LIDOCAINE VISCOUS HCL 2 % MT SOLN
OROMUCOSAL | Status: DC | PRN
  Administered 2020-09-10: 6 mL via OROMUCOSAL

## 2020-09-10 MED ORDER — PROPOFOL 10 MG/ML IV BOLUS
INTRAVENOUS | Status: DC | PRN
  Administered 2020-09-10: 50 mg via INTRAVENOUS

## 2020-09-10 MED ORDER — PROPOFOL 500 MG/50ML IV EMUL
INTRAVENOUS | Status: DC | PRN
  Administered 2020-09-10: 150 ug/kg/min via INTRAVENOUS

## 2020-09-10 MED ORDER — ORAL CARE MOUTH RINSE: 15.0000 mL | Freq: Once | OROMUCOSAL | Status: AC

## 2020-09-10 MED ORDER — LACTATED RINGERS IV SOLN: INTRAVENOUS | Status: DC | PRN

## 2020-09-10 MED ORDER — SENNA 8.6 MG PO TABS
1.0000 | ORAL_TABLET | Freq: Every evening | ORAL | Status: DC | PRN
  Administered 2020-09-10: 8.6 mg via ORAL
  Filled 2020-09-10: qty 1

## 2020-09-10 NOTE — Consult Note (Signed)
[facility] Consultation Oncology  Name: GERRELL TABET      MRN: [id number]    Location: Q676/P950-93  Date: 09/10/2020 Time:5:34 PM   REFERRING PHYSICIAN: Dr. Dwyane Dee  REASON FOR CONSULT: To give recommendations for further anticoagulation.   DIAGNOSIS: Hypercoagulable state with DVT and PE.  HISTORY OF PRESENT ILLNESS: Mr. Mccollam is a 59 year old African-American male seen in consultation today at the request of Dr. Dwyane Dee for further recommendations regarding anticoagulation. 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 He was evaluated and followed in our clinic until 07/29/2016 and was lost to follow-up. He was on long-term Xarelto for supposed Coumadin failure although compliance is a major factor. He reports that he has been off of Xarelto for the past 5 to 6 months as he is directable increased all of a sudden. He came to the hospital with acute onset dizziness and numbness of the left facial region and left upper extremity with imaging showing multiple small infarcts bilaterally highly suggestive of embolic phenomena. He was evaluated by Dr. Conni Elliot from cardiology and a transesophageal echocardiogram was performed today which showed LVEF of 60 to 65% with normal function. Agitated saline contrast bubble study was negative with no evidence of interatrial shunt. Left atrium is mildly dilated with no left atrial/atrial appendage thrombus.  PAST MEDICAL HISTORY:   Past Medical History:  Diagnosis Date  . Acute CVA (cerebrovascular accident) ([place]) 09/09/2020  . Chronic anticoagulation 11/14/2014  . CKD (chronic kidney disease) stage 3, GFR 30-59 ml/min (HCC) 10/24/2019  . CKD (chronic kidney disease) stage 3, GFR  30-59 ml/min (HCC) 10/24/2019  . Clotting disorder ([place])   . 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 DVT (deep venous thrombosis) ([place]) 06/15/2011  . DVT of leg (deep venous thrombosis) (HCC)    rt. leg  . H/O PE (pulmonary embolism) 06/15/2011  . 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 Prediabetes 10/24/2019    ALLERGIES: No Known Allergies    MEDICATIONS: I have reviewed the patient's current medications.     PAST SURGICAL HISTORY Past Surgical History:  Procedure Laterality Date  . COLONOSCOPY WITH PROPOFOL N/A 10/29/2015   Procedure: COLONOSCOPY WITH PROPOFOL;  Surgeon: Danie Binder, MD;  Location: [facility];  Service: Endoscopy;  Laterality: N/A;  930  . None to date  10/07/15    FAMILY HISTORY: Family History  Problem Relation Age of Onset  . COPD Father   . Stroke Brother   . Pulmonary embolism Sister   . Throat cancer Brother   . Arthritis Mother   . Diabetes Mother   . Stroke Mother   . Hypertension Brother   . Diabetes Brother   . Arthritis Brother   . Diabetes Sister   . Colon cancer Neg Hx     SOCIAL HISTORY:  reports that he has been smoking cigarettes. He started smoking about 36 years ago. He has a 30.00 pack-year smoking history. He has never used smokeless tobacco. He reports current alcohol use of about 3.0 standard drinks of alcohol per week. He reports previous drug use.  PERFORMANCE STATUS: The patient's performance status is 1 - Symptomatic but completely ambulatory  PHYSICAL EXAM: Most Recent Vital Signs: Blood pressure (!) 155/78, pulse (!) 50, temperature 98.4 F (36.9 C), temperature source Oral, resp. rate 20, height 6' (1.829 m), weight 265 lb 10.5 oz (120.5 kg), SpO2 100 %. BP (!) 155/78 (BP Location: Left Arm)   Pulse (!) 50   Temp 98.4 F (36.9 C) (Oral)   Resp 20   Ht 6' (1.829 m)   Wt 265 lb 10.5 oz (120.5 kg)   SpO2 100%    BMI 36.03 kg/m  General appearance: alert, cooperative and appears stated age Lungs: clear to auscultation bilaterally Heart: regular rate and rhythm Abdomen: soft, non-tender; bowel sounds normal; no masses,  no organomegaly Extremities: Trace edema bilaterally with chronic venous stasis changes in both ankles. Skin: Skin color, texture, turgor normal. No rashes or lesions Lymph nodes: Cervical, supraclavicular, and axillary nodes normal. Neurologic: Grossly normal  LABORATORY DATA:  Results for orders placed or performed during the hospital encounter of 09/07/20 (from the past 48 hour(s))  Troponin I (High Sensitivity)     Status: Abnormal   Collection Time: 09/09/20  1:25 AM  Result Value Ref Range   Troponin I (High Sensitivity) 66 (H) <18 ng/L    Comment: (NOTE) Elevated high sensitivity troponin I (hsTnI) values and significant  changes across serial measurements may suggest ACS but many other  chronic and acute conditions are known to elevate hsTnI results.  Refer to the "Links" section for chest pain algorithms and additional  guidance. Performed at [facility], [address], [place], [place] [address]   Basic metabolic panel     Status: Abnormal   Collection Time: 09/09/20  1:26 AM  Result Value Ref Range   Sodium 136 135 - 145 mmol/L   Potassium 3.3 (L) 3.5 - 5.1 mmol/L   Chloride 102 98 - 111 mmol/L   CO2 27 22 - 32 mmol/L   Glucose, Bld 137 (H) 70 - 99 mg/dL    Comment: Glucose reference range applies only to samples taken after fasting for at least 8 hours.   BUN 19 6 - 20 mg/dL   Creatinine, Ser 1.54 (H) 0.61 - 1.24 mg/dL   Calcium 8.8 (L) 8.9 - 10.3 mg/dL   GFR, Estimated 49 (L) >60 mL/min   Anion gap 7 5 - 15    Comment: Performed at [facility], [address], [place], [place] [address]  Basic metabolic panel     Status: Abnormal   Collection Time: 09/10/20 11:48 AM  Result Value Ref Range   Sodium 137 135 - 145 mmol/L   Potassium 4.0 3.5 - 5.1 mmol/L     Comment: DELTA CHECK NOTED   Chloride 103 98 - 111 mmol/L   CO2 27 22 - 32 mmol/L   Glucose, Bld 90 70 - 99 mg/dL    Comment: Glucose reference range applies only to samples taken after fasting for at least 8 hours.   BUN 16 6 - 20 mg/dL   Creatinine, Ser 1.63 (H) 0.61 - 1.24 mg/dL   Calcium 9.1 8.9 - 10.3 mg/dL   GFR, Estimated 46 (L) >60 mL/min   Anion gap 7 5 - 15    Comment: Performed at [facility], [address] [place], [place] [address]      RADIOGRAPHY: MR BRAIN WO CONTRAST  Result Date: 09/09/2020 CLINICAL DATA:  Neuro deficit, acute stroke suspected. Weakness and headache for 3 days. EXAM: MRI HEAD WITHOUT CONTRAST TECHNIQUE: Multiplanar, multiecho pulse sequences of the brain and surrounding  structures were obtained without intravenous contrast. COMPARISON:  CT exams from 09/07/2020 FINDINGS: Brain: There are many small infarcts both supratentorially and infratentorially. Sing close small infarcts within the right cerebellum, bilateral occipital lobes, bilateral frontal lobes and the left parietal and temporal lobes. Mild associated edema without substantial mass effect. No midline shift. Basal cisterns are patent. Additional superimposed T2/FLAIR hyperintensities, compatible with chronic microvascular ischemic disease. No hydrocephalus. No mass lesion. Vascular: Poor visualization of the basilar artery flow void, which correlates with fetal type PCAs bilaterally and diminutive vertebrobasilar system seen on recent CTA. Skull and upper cervical spine: Degenerative changes at the craniocervical junction with pannus posterior to the dens but no high-grade canal stenosis. Sinuses/Orbits: Right maxillary retention cyst. Mild ethmoid air cell mucosal thickening. Otherwise, sinuses are clear. No acute orbital abnormality. Other: No mastoid effusions. IMPRESSION: Many small supratentorial and infratentorial acute/early subacute infarcts, as above. Involvement of multiple vascular  territories suggests a central embolic source. Mild associated edema without substantial mass effect. Electronically Signed   By: Margaretha Sheffield MD   On: 09/09/2020 10:03   ECHO TEE  Result Date: 09/10/2020    TRANSESOPHOGEAL ECHO REPORT   Patient Name:   Matthew Wise Date of Exam: 09/10/2020 Medical Rec #:  [id number]       Height:       72.0 in Accession #:    [id number]      Weight:       265.7 lb Date of Birth:  September 27, 1961      BSA:          2.404 m Patient Age:    60 years        BP:           143/91 mmHg Patient Gender: M               HR:           88 bpm. Exam Location:  [facility] Procedure: Transesophageal Echo, Cardiac Doppler and Color Doppler Indications:    eval TIA  History:        Patient has prior history of Echocardiogram examinations, most                 recent 09/08/2020. Risk Factors:Hypertension. PE (pulmonary                 embolism), (From Hx)Chronic anticoagulation, DVT of leg (deep                 venous thrombosis) (HCC) (From Hx), CKD (chronic kidney disease)                 stage 3, GFR 30-59 ml/min (HCC) (From Hx), Nicotine dependence,                 Prediabetes.  Sonographer:    Alvino Chapel RCS Referring Phys: [id number] [place]: TEE procedure time was 8 minutes. The transesophogeal probe was passed without difficulty through the esophogus of the patient. Imaged were obtained with the patient in a left lateral decubitus position. Local oropharyngeal anesthetic was provided with viscous lidocaine. Sedation performed by different physician. The patient was monitored while under deep sedation. Image quality was excellent. The patient developed no complications during the procedure. IMPRESSIONS  1. Left ventricular ejection fraction, by estimation, is 60 to 65%. The left ventricle has normal function. The left ventricle has no regional wall motion abnormalities.  2. Right ventricular systolic function is normal. The right ventricular size  is normal.  3. Left  atrial size was mildly dilated. No left atrial/left atrial appendage thrombus was detected. The LAA emptying velocity was 80 cm/s.  4. The mitral valve is grossly normal. Mild mitral valve regurgitation.  5. The aortic valve is tricuspid. Aortic valve regurgitation is mild.  6. There is mild (Grade II) plaque involving the descending aorta.  7. Agitated saline contrast bubble study was negative, with no evidence of any interatrial shunt. FINDINGS  Left Ventricle: Left ventricular ejection fraction, by estimation, is 60 to 65%. The left ventricle has normal function. The left ventricle has no regional wall motion abnormalities. The left ventricular internal cavity size was normal in size. Right Ventricle: The right ventricular size is normal. No increase in right ventricular wall thickness. Right ventricular systolic function is normal. Left Atrium: Left atrial size was mildly dilated. No left atrial/left atrial appendage thrombus was detected. The LAA emptying velocity was 80 cm/s. Right Atrium: Right atrial size was normal in size. Pericardium: There is no evidence of pericardial effusion. Mitral Valve: The mitral valve is grossly normal. Mild mitral annular calcification. Mild mitral valve regurgitation. Tricuspid Valve: The tricuspid valve is grossly normal. Tricuspid valve regurgitation is mild. Aortic Valve: The aortic valve is tricuspid. Aortic valve regurgitation is mild. Pulmonic Valve: The pulmonic valve was grossly normal. Pulmonic valve regurgitation is trivial. Aorta: The aortic root is normal in size and structure. There is mild (Grade II) plaque involving the descending aorta. IAS/Shunts: No atrial level shunt detected by color flow Doppler. Agitated saline contrast was given intravenously to evaluate for intracardiac shunting. Agitated saline contrast bubble study was negative, with no evidence of any interatrial shunt. Rozann Lesches MD Electronically signed by Rozann Lesches MD Signature  Date/Time: 09/10/2020/12:51:59 PM    Final         ASSESSMENT and PLAN:  1. Hypercoagulable state: -This patient was followed in our clinic until August 2017 and was lost to follow-up. 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 -He also has history of sickle cell trait which also contributes to hypercoagulable state. 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 -Admitted with dizziness and numbness, MRI of the brain showed many small supratentorial and infratentorial acute/early subacute infarcts suggesting central embolic source. -He reports that he has been not taking Xarelto for the past 4 to 6 months as his deductible increased suddenly to $400-$500. -He had transesophageal echocardiogram on 09/10/2020 which showed normal LV ejection fraction. Left atrial/left atrial appendage thrombus was not detected. Agitated saline contrast bubble study was negative for interatrial shunt. -He has proven hypercoagulable state with multiple episodes of thromboembolism. -He does not require any further testing. -Highly recommend starting him back on Xarelto as he is not resistant to it. -Please give Xarelto kit through pharmacy. -He was encouraged to follow-up in our clinic in 2 weeks. We will arrange assistance with his deductible through our social worker and Development worker, community.  All questions were answered. The patient knows to call the clinic with any problems, questions or concerns. We can certainly see the patient much sooner if necessary.   Derek Jack

## 2020-09-10 NOTE — Progress Notes (Signed)
*  PRELIMINARY RESULTS* Echocardiogram Echocardiogram Transesophageal has been performed with saline bubble study.  Samuel Germany 09/10/2020, 11:23 AM

## 2020-09-10 NOTE — CV Procedure (Signed)
Transesophageal echocardiogram  Indication: Rule out intracardiac source of embolization  Description of procedure: Informed consent was obtained.  Patient was taken to the PACU where a timeout was performed.  Viscous lidocaine was utilized for anesthesia of the oropharynx.  Patient underwent monitored deep sedation per the Anesthesia service, please refer to their records for medication and dose administration.  Bite block utilized.  A multiplane transesophageal echocardiographic probe was inserted into the esophagus and multiple images obtained.  Report is as follows:  1. Left ventricular ejection fraction, by estimation, is 60 to 65%. The  left ventricle has normal function. The left ventricle has no regional  wall motion abnormalities.  2. Right ventricular systolic function is normal. The right ventricular  size is normal.  3. Left atrial size was mildly dilated. No left atrial/left atrial  appendage thrombus was detected. The LAA emptying velocity was 80 cm/s.  4. The mitral valve is grossly normal. Mild mitral valve regurgitation.  5. The aortic valve is tricuspid. Aortic valve regurgitation is mild.  6. There is mild (Grade II) plaque involving the descending aorta.  7. Agitated saline contrast bubble study was negative, with no evidence  of any interatrial shunt.   Patient tolerated the procedure without immediate complications.  Satira Sark, M.D., F.A.C.C.

## 2020-09-10 NOTE — Transfer of Care (Signed)
Immediate Anesthesia Transfer of Care Note  Patient: Matthew Wise  Procedure(s) Performed: TRANSESOPHAGEAL ECHOCARDIOGRAM (TEE) WITH PROPOFOL (N/A )  Patient Location: PACU  Anesthesia Type:General  Level of Consciousness: awake, alert , oriented and patient cooperative  Airway & Oxygen Therapy: Patient Spontanous Breathing and Patient connected to nasal cannula oxygen  Post-op Assessment: Report given to RN, Post -op Vital signs reviewed and stable and Patient moving all extremities X 4  Post vital signs: Reviewed and stable  Last Vitals:  Vitals Value Taken Time  BP    Temp    Pulse    Resp    SpO2      Last Pain:  Vitals:   09/10/20 0919  TempSrc: Oral  PainSc: 0-No pain      Patients Stated Pain Goal: 6 (20/25/42 7062)  Complications: No complications documented.

## 2020-09-10 NOTE — Progress Notes (Signed)
PROGRESS NOTE    Matthew Wise  TKP:[id number] DOB: 1961-02-11 DOA: 09/07/2020 PCP: Doree Albee, MD   Brief Narrative:  This 59 years old male with medical history significant for hypertension, bilateral lower extremity DVT and PE with noncompliance who presents to the emergency department due to sudden onset of dizziness associated with blurred vision and numbness in his left hand fingers. Numbness and tingling resolved after about 15 to 20 minutes, but the dizziness and blurred vision lasted few hours and did not resolve until after he arrived at the ED.  CT head without contrast showed no acute intracranial findings.  Patient is admitted for dizziness and numbness, to rule out TIA versus CVA. Patient underwent MRI which showed multiple small supratentorial and infratentorial acute/ subacute infarcts.  Cardiology recommended TEE to rule out embolic source.  TEE was negative for any vegetation or emboli.  Hematology consulted to help with long term anticoagulation regimen at discharge.  Assessment & Plan:   Principal Problem:   Transient ischemic attack Active Problems:   DVT (deep venous thrombosis) (HCC)   Pulmonary embolism (HCC)   CKD (chronic kidney disease) stage 3, GFR 30-59 ml/min (HCC)   History of hypertension   Elevated troponin   Dizziness   Numbness and tingling   H/O medication noncompliance   Acute CVA (cerebrovascular accident) ([place])  Dizziness / Numbness and tingling due to acute /subacute CVA. Patient reports back to baseline, symptoms has resolved. CT of head without contrast showed: No acute intracranial findings. Admitted to telemetry unit  Echocardiogram : Left ventricular ejection fraction 55 to 60%.  No regional wall motion abnormalities. MRI of brain without contrast : Multiple small supratentorial and infratentorial acute subacute infarcts. Continue fall precautions and neuro checks Hb A1C 5.6, Lipid profile  : LDL 96 , LDL goal below 70 Continue  PT/ST/OT eval and treat Bedside swallow eval by nursing prior to diet. Cardiology recommended TEE to rule out embolic source. TEE was negative for any vegetation or emboli.   Hx. Of  DVT /PE: Patient has not been compliant with Xarelto. This is temporarily held due to stroke like symptoms. Hematology consulted to help with long term anticoagulation regimen at discharge.  Elevated troponin possibly secondary to type II demand ischemia: Troponin 46 > 57, 60> 62. Patient denies any chest pain. No changes noted from prior EKG. Cardiology was consulted due to significant bradycardia, HR 37 while sleeping. Recommended TEE to rule out embolic source which was negative. Avoid AV nodal blocking medications.  History of hypertension: Patient denies history of hypertension, though amlodipine 2.5 mg x 1 daily was noted in his med rec. BP was elevated at 165/109 on arrival to the ED Permissive hypertension will be allowed at this time with plan to only treat BP when it's > 220/120.  CKD stage III:  Stable; renally adjust medications, avoid nephrotoxic agents/dehydration/hypotension.  History of medication noncompliance Importance of medication compliance was discussed with patient, and he verbalized understanding the discussion.  Obesity (BMI 37.30) Patient was counseled on diet and exercise Patient will need to follow-up with outpatient PCP for weight loss program  DVT prophylaxis: SCDs. Code Status: Full Family Communication: No one at bed side. Disposition Plan:  Status is: Inpatient  Remains inpatient appropriate because:Inpatient level of care appropriate due to severity of illness   Dispo: The patient is from: Home              Anticipated d/c is to: Home  Anticipated d/c date is: 1 day              Patient currently is not medically stable to d/c.   Barriers to discharge: Ongoing work-up for stroke.  Consultants:   Neurology, cardiology  Procedures:    Antimicrobials:  Anti-infectives (From admission, onward)   None      Subjective: Patient was seen and examined at bedside.  Overnight events noted.  He is a s/p TEE which was negative for any embolic source.   His symptoms completely resolved.  He denies any dizziness or numbness.  Objective: Vitals:   09/10/20 1058 09/10/20 1100 09/10/20 1115 09/10/20 1416  BP: 124/80 126/74 (!) 145/86 (!) 155/78  Pulse: 67 61 (!) 52 (!) 50  Resp: 14 18 17 20   Temp:    98.4 F (36.9 C)  TempSrc:    Oral  SpO2: 100% 100% 100% 100%  Weight:      Height:        Intake/Output Summary (Last 24 hours) at 09/10/2020 1527 Last data filed at 09/10/2020 1120 Gross per 24 hour  Intake 900 ml  Output 0 ml  Net 900 ml   Filed Weights   09/07/20 1650 09/08/20 2114 09/10/20 0919  Weight: 124.7 kg 120.5 kg 120.5 kg    Examination:  General exam: Appears calm and comfortable  Respiratory system: Clear to auscultation. Respiratory effort normal. Cardiovascular system: S1 & S2 heard, RRR. No JVD, murmurs, rubs, gallops or clicks. No pedal edema. Gastrointestinal system: Abdomen is nondistended, soft and nontender. No organomegaly or masses felt. Normal bowel sounds heard. Central nervous system: Alert and oriented. No focal neurological deficits. Extremities: Symmetric 5 x 5 power. Skin: No rashes, lesions or ulcers Psychiatry: Judgement and insight appear normal. Mood & affect appropriate.     Data Reviewed: I have personally reviewed following labs and imaging studies  CBC: Recent Labs  Lab 09/07/20 1811 09/08/20 0429  WBC 4.9 4.8  NEUTROABS 2.7  --   HGB 15.3 14.7  HCT 44.3 42.7  MCV 87.0 86.1  PLT 190 742   Basic Metabolic Panel: Recent Labs  Lab 09/07/20 1811 09/08/20 0429 09/09/20 0126 09/10/20 1148  NA 138 138 136 137  K 4.3 3.6 3.3* 4.0  CL 104 104 102 103  CO2 25 25 27 27   GLUCOSE 92 87 137* 90  BUN 20 21* 19 16  CREATININE 1.65* 1.54* 1.54* 1.63*  CALCIUM 9.2  9.2 8.8* 9.1  MG  --  2.3  --   --   PHOS  --  3.7  --   --    GFR: Estimated Creatinine Clearance: 67 mL/min (A) (by C-G formula based on SCr of 1.63 mg/dL (H)). Liver Function Tests: Recent Labs  Lab 09/07/20 1811 09/08/20 0429  AST 21 22  ALT 20 20  ALKPHOS 70 68  BILITOT 1.2 1.0  PROT 7.6 7.1  ALBUMIN 4.0 3.7   No results for input(s): LIPASE, AMYLASE in the last 168 hours. No results for input(s): AMMONIA in the last 168 hours. Coagulation Profile: Recent Labs  Lab 09/07/20 1811 09/08/20 0429  INR 1.0 1.0   Cardiac Enzymes: No results for input(s): CKTOTAL, CKMB, CKMBINDEX, TROPONINI in the last 168 hours. BNP (last 3 results) No results for input(s): PROBNP in the last 8760 hours. HbA1C: Recent Labs    09/08/20 0429  HGBA1C 5.3   CBG: Recent Labs  Lab 09/07/20 1718  GLUCAP 103*   Lipid Profile: Recent Labs  09/08/20 0429  CHOL 168  HDL 66  LDLCALC 83  TRIG 96  CHOLHDL 2.5   Thyroid Function Tests: No results for input(s): TSH, T4TOTAL, FREET4, T3FREE, THYROIDAB in the last 72 hours. Anemia Panel: No results for input(s): VITAMINB12, FOLATE, FERRITIN, TIBC, IRON, RETICCTPCT in the last 72 hours. Sepsis Labs: No results for input(s): PROCALCITON, LATICACIDVEN in the last 168 hours.  Recent Results (from the past 240 hour(s))  Respiratory Panel by RT PCR (Flu A&B, Covid) - Nasopharyngeal Swab     Status: None   Collection Time: 09/07/20  7:03 PM   Specimen: Nasopharyngeal Swab  Result Value Ref Range Status   SARS Coronavirus 2 by RT PCR NEGATIVE NEGATIVE Final    Comment: (NOTE) SARS-CoV-2 target nucleic acids are NOT DETECTED.  The SARS-CoV-2 RNA is generally detectable in upper respiratoy specimens during the acute phase of infection. The lowest concentration of SARS-CoV-2 viral copies this assay can detect is 131 copies/mL. A negative result does not preclude SARS-Cov-2 infection and should not be used as the sole basis for treatment  or other patient management decisions. A negative result may occur with  improper specimen collection/handling, submission of specimen other than nasopharyngeal swab, presence of viral mutation(s) within the areas targeted by this assay, and inadequate number of viral copies (<131 copies/mL). A negative result must be combined with clinical observations, patient history, and epidemiological information. The expected result is Negative.  Fact Sheet for Patients:  PinkCheek.be  Fact Sheet for Healthcare Providers:  GravelBags.it  This test is no t yet approved or cleared by the [place] FDA and  has been authorized for detection and/or diagnosis of SARS-CoV-2 by FDA under an Emergency Use Authorization (EUA). This EUA will remain  in effect (meaning this test can be used) for the duration of the COVID-19 declaration under Section 564(b)(1) of the Act, 21 U.S.C. section 360bbb-3(b)(1), unless the authorization is terminated or revoked sooner.     Influenza A by PCR NEGATIVE NEGATIVE Final   Influenza B by PCR NEGATIVE NEGATIVE Final    Comment: (NOTE) The Xpert Xpress SARS-CoV-2/FLU/RSV assay is intended as an aid in  the diagnosis of influenza from Nasopharyngeal swab specimens and  should not be used as a sole basis for treatment. Nasal washings and  aspirates are unacceptable for Xpert Xpress SARS-CoV-2/FLU/RSV  testing.  Fact Sheet for Patients: PinkCheek.be  Fact Sheet for Healthcare Providers: GravelBags.it  This test is not yet approved or cleared by the [place] FDA and  has been authorized for detection and/or diagnosis of SARS-CoV-2 by  FDA under an Emergency Use Authorization (EUA). This EUA will remain  in effect (meaning this test can be used) for the duration of the  Covid-19 declaration under Section 564(b)(1) of the Act, 21  U.S.C.  section 360bbb-3(b)(1), unless the authorization is  terminated or revoked. Performed at [facility], [address], [place], [place] [address]     Radiology Studies: MR BRAIN WO CONTRAST  Result Date: 09/09/2020 CLINICAL DATA:  Neuro deficit, acute stroke suspected. Weakness and headache for 3 days. EXAM: MRI HEAD WITHOUT CONTRAST TECHNIQUE: Multiplanar, multiecho pulse sequences of the brain and surrounding structures were obtained without intravenous contrast. COMPARISON:  CT exams from 09/07/2020 FINDINGS: Brain: There are many small infarcts both supratentorially and infratentorially. Sing close small infarcts within the right cerebellum, bilateral occipital lobes, bilateral frontal lobes and the left parietal and temporal lobes. Mild associated edema without substantial mass effect. No midline shift. Basal cisterns  are patent. Additional superimposed T2/FLAIR hyperintensities, compatible with chronic microvascular ischemic disease. No hydrocephalus. No mass lesion. Vascular: Poor visualization of the basilar artery flow void, which correlates with fetal type PCAs bilaterally and diminutive vertebrobasilar system seen on recent CTA. Skull and upper cervical spine: Degenerative changes at the craniocervical junction with pannus posterior to the dens but no high-grade canal stenosis. Sinuses/Orbits: Right maxillary retention cyst. Mild ethmoid air cell mucosal thickening. Otherwise, sinuses are clear. No acute orbital abnormality. Other: No mastoid effusions. IMPRESSION: Many small supratentorial and infratentorial acute/early subacute infarcts, as above. Involvement of multiple vascular territories suggests a central embolic source. Mild associated edema without substantial mass effect. Electronically Signed   By: Margaretha Sheffield MD   On: 09/09/2020 10:03   ECHO TEE  Result Date: 09/10/2020    TRANSESOPHOGEAL ECHO REPORT   Patient Name:   Matthew Wise Date of Exam: 09/10/2020 Medical Rec #:   [id number]       Height:       72.0 in Accession #:    [id number]      Weight:       265.7 lb Date of Birth:  [birth year]      BSA:          2.404 m Patient Age:    27 years        BP:           143/91 mmHg Patient Gender: M               HR:           88 bpm. Exam Location:  [facility] Procedure: Transesophageal Echo, Cardiac Doppler and Color Doppler Indications:    eval TIA  History:        Patient has prior history of Echocardiogram examinations, most                 recent 09/08/2020. Risk Factors:Hypertension. PE (pulmonary                 embolism), (From Hx)Chronic anticoagulation, DVT of leg (deep                 venous thrombosis) (HCC) (From Hx), CKD (chronic kidney disease)                 stage 3, GFR 30-59 ml/min (HCC) (From Hx), Nicotine dependence,                 Prediabetes.  Sonographer:    Alvino Chapel RCS Referring Phys: [id number] [place]: TEE procedure time was 8 minutes. The transesophogeal probe was passed without difficulty through the esophogus of the patient. Imaged were obtained with the patient in a left lateral decubitus position. Local oropharyngeal anesthetic was provided with viscous lidocaine. Sedation performed by different physician. The patient was monitored while under deep sedation. Image quality was excellent. The patient developed no complications during the procedure. IMPRESSIONS  1. Left ventricular ejection fraction, by estimation, is 60 to 65%. The left ventricle has normal function. The left ventricle has no regional wall motion abnormalities.  2. Right ventricular systolic function is normal. The right ventricular size is normal.  3. Left atrial size was mildly dilated. No left atrial/left atrial appendage thrombus was detected. The LAA emptying velocity was 80 cm/s.  4. The mitral valve is grossly normal. Mild mitral valve regurgitation.  5. The aortic valve is tricuspid. Aortic valve regurgitation is mild.  6. There is mild (Grade II)  plaque involving the  descending aorta.  7. Agitated saline contrast bubble study was negative, with no evidence of any interatrial shunt. FINDINGS  Left Ventricle: Left ventricular ejection fraction, by estimation, is 60 to 65%. The left ventricle has normal function. The left ventricle has no regional wall motion abnormalities. The left ventricular internal cavity size was normal in size. Right Ventricle: The right ventricular size is normal. No increase in right ventricular wall thickness. Right ventricular systolic function is normal. Left Atrium: Left atrial size was mildly dilated. No left atrial/left atrial appendage thrombus was detected. The LAA emptying velocity was 80 cm/s. Right Atrium: Right atrial size was normal in size. Pericardium: There is no evidence of pericardial effusion. Mitral Valve: The mitral valve is grossly normal. Mild mitral annular calcification. Mild mitral valve regurgitation. Tricuspid Valve: The tricuspid valve is grossly normal. Tricuspid valve regurgitation is mild. Aortic Valve: The aortic valve is tricuspid. Aortic valve regurgitation is mild. Pulmonic Valve: The pulmonic valve was grossly normal. Pulmonic valve regurgitation is trivial. Aorta: The aortic root is normal in size and structure. There is mild (Grade II) plaque involving the descending aorta. IAS/Shunts: No atrial level shunt detected by color flow Doppler. Agitated saline contrast was given intravenously to evaluate for intracardiac shunting. Agitated saline contrast bubble study was negative, with no evidence of any interatrial shunt. Rozann Lesches MD Electronically signed by Rozann Lesches MD Signature Date/Time: 09/10/2020/12:51:59 PM    Final    Scheduled Meds: Continuous Infusions:   LOS: 1 day    Time spent: 25 mins    Shawna Clamp, MD [facility]   If 7PM-7AM, please contact night-coverage

## 2020-09-10 NOTE — Anesthesia Postprocedure Evaluation (Signed)
Anesthesia Post Note  Patient: Matthew Wise  Procedure(s) Performed: TRANSESOPHAGEAL ECHOCARDIOGRAM (TEE) WITH PROPOFOL (N/A )  Patient location during evaluation: PACU Anesthesia Type: General Level of consciousness: awake, oriented, awake and alert and patient cooperative Pain management: satisfactory to patient Vital Signs Assessment: post-procedure vital signs reviewed and stable Respiratory status: spontaneous breathing, respiratory function stable and nonlabored ventilation Cardiovascular status: stable Postop Assessment: no apparent nausea or vomiting Anesthetic complications: no   No complications documented.   Last Vitals:  Vitals:   09/10/20 0559 09/10/20 0919  BP: (!) 150/82 (!) 148/92  Pulse: (!) 57 (!) 52  Resp: 20 17  Temp: 36.7 C 36.8 C  SpO2: 99% 97%    Last Pain:  Vitals:   09/10/20 0919  TempSrc: Oral  PainSc: 0-No pain                 [name]

## 2020-09-10 NOTE — Progress Notes (Signed)
   Progress Note  Patient Name: Matthew Wise Date of Encounter: 09/10/2020  Consulting Cardiologist:  Dr. Dorris Carnes  Please refer to full cardiology consultation per Dr. Harrington Challenger from yesterday.  TEE performed today to assess for intracardiac source of embolus in light of brain MRI findings.  Please see full report.  Patient tolerated procedure without immediate complications.  No evidence of left or right atrial appendage thrombi, normal left atrial appendage emptying velocity, no valvular vegetations, no obvious LV mural thrombus, no interatrial communication by agitated saline injection, also only mild descending aortic plaque which was nonmobile.  At this point no further cardiac testing is planned.  With questioning of hypercoagulable state, consider hematology evaluation for formal testing as this may help to guide use of long-term anticoagulant.  Bradycardia does not look to be clinically significant at this point, would simply follow for now.  Consider outpatient evaluation for OSA with nocturnal bradycardia.  Not on any AV nodal blockers at this time.  We will sign off.  Signed, Rozann Lesches, MD  09/10/2020, 12:56 PM

## 2020-09-11 ENCOUNTER — Telehealth: Payer: Self-pay | Admitting: *Deleted

## 2020-09-11 DIAGNOSIS — I639 Cerebral infarction, unspecified: Secondary | ICD-10-CM

## 2020-09-11 MED ORDER — AMLODIPINE BESYLATE 2.5 MG PO TABS: 2.5000 mg | ORAL_TABLET | Freq: Every day | ORAL | 1 refills | Status: DC

## 2020-09-11 MED ORDER — ATORVASTATIN CALCIUM 40 MG PO TABS
40.0000 mg | ORAL_TABLET | Freq: Every day | ORAL | 1 refills | Status: DC
Start: 2020-09-11 — End: 2020-11-20

## 2020-09-11 MED ORDER — RIVAROXABAN (XARELTO) VTE STARTER PACK (15 & 20 MG): ORAL_TABLET | ORAL | 0 refills | Status: DC

## 2020-09-11 NOTE — Discharge Instructions (Signed)
You have follow-up arranged with the hematology/oncology clinic in 2 weeks to monitor your Xarelto use.  The social worker and financial counselor will arrange assistance with your deductible.  You will be sent a heart event monitor in 1 week by the heart doctors to monitor for any abnormal rhythms that could be contributing to your recent diagnosis of stroke.  In addition to taking Xarelto he will also be started on new medication atorvastatin (for decreasing stroke risk), and new prescription for your amlodipine has been sent to your pharmacy as well.  You should see your primary care doctor in 1-2 weeks for close monitoring

## 2020-09-11 NOTE — Telephone Encounter (Signed)
-----   Message from Erma Heritage, [place] sent at 09/11/2020 11:16 AM EDT ----- Regarding: Outpatient Monitor Matthew Wise,   This patient will need a 30-day monitor for CVA per Dr. Lonny Prude [facility]). Evaluated by Dr. Harrington Challenger during admission. Can be mailed to the patient.   Thanks,  [place]

## 2020-09-11 NOTE — Discharge Summary (Addendum)
Matthew Wise SMO:[id number] DOB: Jul 08, 1961 DOA: 09/07/2020  PCP: Doree Albee, MD  Admit date: 09/07/2020 Discharge date: 09/11/2020  Admitted From: Home Disposition: Home  Recommendations for Outpatient Follow-up:  1. Follow up with PCP in 1-2 weeks. BMP check 2. 2-week follow-up arranged with hematology/oncology 3. Xarelto starter pack provided on discharge, will need to continue indefinitely given hypercoagulable disorder 4. Medications also consist of atorvastatin, and refill for amlodipine 5. Cardiac event monitor, 30-day, arranged via cardiology   Home Health: none  Equipment/Devices:none  Discharge Condition:Stable  CODE STATUS:FULL    Brief/Interim Summary: History of present illness:  Matthew Wise is a 59 y.o. 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  Remaining hospital course addressed in problem based format below:   Hospital Course:   Multiple small infarcts  Concerning for embolic event in setting of poor adherence to anticoagulation. His symptoms only lasted for a few hours with no residual deficits. 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 Evaluated by hematology who recommended resuming xarelto. Unfortunately due to financial constraints with insurance had been off xarelto for 4-6 months -starter kit script with coupon (given financial constraints) provided on discharge -outpatient follow up with hem/onc in 2 weeks, will also have  further financial assistance via arrangements with social worker in hematology clinic  Nocturnal bradycardia. Noted by cardiology. Not clinically significant with no current symptoms. Not on any AV nodal blockers -consider outpatient evaluation for OSA  History of HTN. Previously on amlodipine but has been off for quite some time. Allowed permissive hypertension in setting of acute cva - on discharge provided refill of amlodipine  Elevated BMI. . Patient has noticeable amount of muscle on exam and is well built, suspect some of weight from high amount of muscle. Doubt his BMI really represents class 2 obesity -follow up with PCP, waist circumference may be more beneficial  Elevated troponin, suspect demand ischemia. Troponin peak of 62. Without chest pain and no ischemic change son EKG.   CKD, stage III. Stable at baseline. Suspect related to HTN - monitor BMP on outpatient follow up   Consultations:  Cardiology, Hematology, Neurology  Procedures/Studies: TTE, 10/10  1. Left ventricular ejection fraction, by estimation, is 55 to 60%. The  left ventricle has normal function. The left ventricle has no regional  wall motion abnormalities. Left ventricular diastolic parameters are  indeterminate.  2. Right ventricular systolic function is normal. The right ventricular  size is normal.  3. Left atrial size was mildly dilated.  4. The mitral valve is grossly normal. Trivial mitral valve  regurgitation.  5. The aortic valve is grossly normal. Aortic valve regurgitation is  trivial.  6. The inferior vena cava is normal in size with greater than 50%  respiratory variability, suggesting right atrial pressure of 3 mmHg.   TEE 10/12  1. Left ventricular ejection fraction, by estimation, is 60 to 65%. The  left ventricle has normal function. The left ventricle has no regional  wall motion abnormalities.  2. Right ventricular systolic function is  normal. The right ventricular   size is normal.  3. Left atrial size was mildly dilated. No left atrial/left atrial  appendage thrombus was detected. The LAA emptying velocity was 80 cm/s.  4. The mitral valve is grossly normal. Mild mitral valve regurgitation.  5. The aortic valve is tricuspid. Aortic valve regurgitation is mild.  6. There is mild (Grade II) plaque involving the descending aorta.  7. Agitated saline contrast bubble study was negative, with no evidence  of any interatrial shunt. Subjective: Feels well. No new weakness Discharge Exam: Vitals:   09/11/20 0114 09/11/20 0547  BP: (!) 147/86 (!) 155/91  Pulse: 66 (!) 56  Resp: 20 20  Temp: 98.7 F (37.1 C) 98.6 F (37 C)  SpO2: 100% 100%   Vitals:   09/10/20 1954 09/10/20 2147 09/11/20 0114 09/11/20 0547  BP:  (!) 182/83 (!) 147/86 (!) 155/91  Pulse:  (!) 58 66 (!) 56  Resp:  20 20 20   Temp:  99.3 F (37.4 C) 98.7 F (37.1 C) 98.6 F (37 C)  TempSrc:  Oral Oral Oral  SpO2: 99% 100% 100% 100%  Weight:      Height:        General: Well built male who looks younger than stated age, Lying in bed, no apparent distress Eyes: EOMI, anicteric ENT: Oral Mucosa clear and moist Cardiovascular: regular rate and rhythm, no murmurs, rubs or gallops, no edema, Respiratory: Normal respiratory effort on room air, lungs clear to auscultation bilaterally Abdomen: soft, non-distended, non-tender, normal bowel sounds Skin: No Rash Neurologic: Grossly no focal neuro deficit.Mental status AAOx3, speech normal, Psychiatric:Appropriate affect, and mood  Discharge Diagnoses:  Principal Problem:   Transient ischemic attack Active Problems:   DVT (deep venous thrombosis) (HCC)   Pulmonary embolism (HCC)   CKD (chronic kidney disease) stage 3, GFR 30-59 ml/min (HCC)   History of hypertension   Elevated troponin   Dizziness   Numbness and tingling   H/O medication noncompliance   Acute CVA (cerebrovascular accident) [facility])    Discharge  Instructions  Discharge Instructions    Diet - low sodium heart healthy   Complete by: As directed    Increase activity slowly   Complete by: As directed      Allergies as of 09/11/2020   No Known Allergies     Medication List    STOP taking these medications   rivaroxaban 20 MG Tabs tablet Commonly known as: Xarelto Replaced by: Rivaroxaban Stater Pack (15 mg and 20 mg)     TAKE these medications   amLODipine 2.5 MG tablet Commonly known as: NORVASC Take 1 tablet (2.5 mg total) by mouth daily.   atorvastatin 40 MG tablet Commonly known as: LIPITOR Take 1 tablet (40 mg total) by mouth daily.   Rivaroxaban Stater Pack (15 mg and 20 mg) Commonly known as: XARELTO STARTER PACK Follow package directions: Take one 81m tablet by mouth twice a day. On day 22, switch to one 221mtablet once a day. Take with food. Replaces: rivaroxaban 20 MG Tabs tablet       No Known Allergies      The results of significant diagnostics from this hospitalization (including imaging, microbiology, ancillary and laboratory) are listed below for reference.     Microbiology: Recent Results (from the past 240 hour(s))  Respiratory Panel by RT PCR (Flu A&B, Covid) - Nasopharyngeal Swab     Status: None   Collection Time: 09/07/20  7:03 PM   Specimen: Nasopharyngeal Swab  Result Value Ref Range Status   SARS Coronavirus 2 by RT PCR NEGATIVE NEGATIVE Final    Comment: (NOTE) SARS-CoV-2 target nucleic acids are NOT DETECTED.  The SARS-CoV-2 RNA is generally detectable in upper respiratoy specimens during the acute phase of infection. The lowest concentration of SARS-CoV-2 viral copies this assay can detect is 131 copies/mL. A negative result does not preclude SARS-Cov-2 infection and should not be used as the sole basis for treatment or other patient management decisions. A negative result may occur with  improper specimen collection/handling, submission of specimen other than  nasopharyngeal swab, presence of viral mutation(s) within the areas targeted by this assay, and inadequate number of viral copies (<131 copies/mL). A negative result must be combined with clinical observations, patient history, and epidemiological information. The expected result is Negative.  Fact Sheet for Patients:  PinkCheek.be  Fact Sheet for Healthcare Providers:  GravelBags.it  This test is no t yet approved or cleared by the [place] FDA and  has been authorized for detection and/or diagnosis of SARS-CoV-2 by FDA under an Emergency Use Authorization (EUA). This EUA will remain  in effect (meaning this test can be used) for the duration of the COVID-19 declaration under Section 564(b)(1) of the Act, 21 U.S.C. section 360bbb-3(b)(1), unless the authorization is terminated or revoked sooner.     Influenza A by PCR NEGATIVE NEGATIVE Final   Influenza B by PCR NEGATIVE NEGATIVE Final    Comment: (NOTE) The Xpert Xpress SARS-CoV-2/FLU/RSV assay is intended as an aid in  the diagnosis of influenza from Nasopharyngeal swab specimens and  should not be used as a sole basis for treatment. Nasal washings and  aspirates are unacceptable for Xpert Xpress SARS-CoV-2/FLU/RSV  testing.  Fact Sheet for Patients: PinkCheek.be  Fact Sheet for Healthcare Providers: GravelBags.it  This test is not yet approved or cleared by the [place] FDA and  has been authorized for detection and/or diagnosis of SARS-CoV-2 by  FDA under an Emergency Use Authorization (EUA). This EUA will remain  in effect (meaning this test can be used) for the duration of the  Covid-19 declaration under Section 564(b)(1) of the Act, 21  U.S.C. section 360bbb-3(b)(1), unless the authorization is  terminated or revoked. Performed at [facility], [address] [place], [place] [address]       Labs: BNP (last 3 results) No results for input(s): BNP in the last 8760 hours. Basic Metabolic Panel: Recent Labs  Lab 09/07/20 1811 09/08/20 0429 09/09/20 0126 09/10/20 1148  NA 138 138 136 137  K 4.3 3.6 3.3* 4.0  CL 104 104 102 103  CO2 25 25 27 27   GLUCOSE 92 87 137* 90  BUN 20 21* 19 16  CREATININE 1.65* 1.54* 1.54* 1.63*  CALCIUM 9.2 9.2 8.8* 9.1  MG  --  2.3  --   --   PHOS  --  3.7  --   --    Liver Function Tests: Recent Labs  Lab 09/07/20 1811 09/08/20 0429  AST 21 22  ALT 20 20  ALKPHOS 70 68  BILITOT 1.2 1.0  PROT 7.6 7.1  ALBUMIN 4.0 3.7   No results for input(s): LIPASE, AMYLASE in the last 168 hours. No results for input(s): AMMONIA in the last 168 hours. CBC: Recent Labs  Lab 09/07/20 1811 09/08/20 0429  WBC 4.9 4.8  NEUTROABS 2.7  --   HGB 15.3 14.7  HCT 44.3 42.7  MCV 87.0 86.1  PLT 190 178   Cardiac  Enzymes: No results for input(s): CKTOTAL, CKMB, CKMBINDEX, TROPONINI in the last 168 hours. BNP: Invalid input(s): POCBNP CBG: Recent Labs  Lab 09/07/20 1718  GLUCAP 103*   D-Dimer No results for input(s): DDIMER in the last 72 hours. Hgb A1c No results for input(s): HGBA1C in the last 72 hours. Lipid Profile No results for input(s): CHOL, HDL, LDLCALC, TRIG, CHOLHDL, LDLDIRECT in the last 72 hours. Thyroid function studies No results for input(s): TSH, T4TOTAL, T3FREE, THYROIDAB in the last 72 hours.  Invalid input(s): FREET3 Anemia work up No results for input(s): VITAMINB12, FOLATE, FERRITIN, TIBC, IRON, RETICCTPCT in the last 72 hours. Urinalysis    Component Value Date/Time   COLORURINE YELLOW 09/07/2020 1720   APPEARANCEUR CLEAR 09/07/2020 1720   LABSPEC 1.011 09/07/2020 1720   PHURINE 5.0 09/07/2020 1720   GLUCOSEU NEGATIVE 09/07/2020 1720   HGBUR NEGATIVE 09/07/2020 1720   BILIRUBINUR NEGATIVE 09/07/2020 1720   KETONESUR NEGATIVE 09/07/2020 1720   PROTEINUR NEGATIVE 09/07/2020 1720   UROBILINOGEN >8.0 (H)  02/03/2015 1854   NITRITE NEGATIVE 09/07/2020 1720   LEUKOCYTESUR NEGATIVE 09/07/2020 1720   Sepsis Labs Invalid input(s): PROCALCITONIN,  WBC,  LACTICIDVEN Microbiology Recent Results (from the past 240 hour(s))  Respiratory Panel by RT PCR (Flu A&B, Covid) - Nasopharyngeal Swab     Status: None   Collection Time: 09/07/20  7:03 PM   Specimen: Nasopharyngeal Swab  Result Value Ref Range Status   SARS Coronavirus 2 by RT PCR NEGATIVE NEGATIVE Final    Comment: (NOTE) SARS-CoV-2 target nucleic acids are NOT DETECTED.  The SARS-CoV-2 RNA is generally detectable in upper respiratoy specimens during the acute phase of infection. The lowest concentration of SARS-CoV-2 viral copies this assay can detect is 131 copies/mL. A negative result does not preclude SARS-Cov-2 infection and should not be used as the sole basis for treatment or other patient management decisions. A negative result may occur with  improper specimen collection/handling, submission of specimen other than nasopharyngeal swab, presence of viral mutation(s) within the areas targeted by this assay, and inadequate number of viral copies (<131 copies/mL). A negative result must be combined with clinical observations, patient history, and epidemiological information. The expected result is Negative.  Fact Sheet for Patients:  PinkCheek.be  Fact Sheet for Healthcare Providers:  GravelBags.it  This test is no t yet approved or cleared by the [place] FDA and  has been authorized for detection and/or diagnosis of SARS-CoV-2 by FDA under an Emergency Use Authorization (EUA). This EUA will remain  in effect (meaning this test can be used) for the duration of the COVID-19 declaration under Section 564(b)(1) of the Act, 21 U.S.C. section 360bbb-3(b)(1), unless the authorization is terminated or revoked sooner.     Influenza A by PCR NEGATIVE NEGATIVE Final    Influenza B by PCR NEGATIVE NEGATIVE Final    Comment: (NOTE) The Xpert Xpress SARS-CoV-2/FLU/RSV assay is intended as an aid in  the diagnosis of influenza from Nasopharyngeal swab specimens and  should not be used as a sole basis for treatment. Nasal washings and  aspirates are unacceptable for Xpert Xpress SARS-CoV-2/FLU/RSV  testing.  Fact Sheet for Patients: PinkCheek.be  Fact Sheet for Healthcare Providers: GravelBags.it  This test is not yet approved or cleared by the [place] FDA and  has been authorized for detection and/or diagnosis of SARS-CoV-2 by  FDA under an Emergency Use Authorization (EUA). This EUA will remain  in effect (meaning this test can be used) for the duration of  the  Covid-19 declaration under Section 564(b)(1) of the Act, 21  U.S.C. section 360bbb-3(b)(1), unless the authorization is  terminated or revoked. Performed at [facility], [address] [place], [place] [address]      Time coordinating discharge: Over 30 minutes  SIGNED:   Desiree Hane, MD  [facility] 09/11/2020, 8:53 PM Pager   If 7PM-7AM, please contact night-coverage [email or web address] Password [email or web address]

## 2020-09-13 ENCOUNTER — Encounter (HOSPITAL_COMMUNITY): Payer: Self-pay | Admitting: Cardiology

## 2020-09-16 DIAGNOSIS — E6609 Other obesity due to excess calories: Secondary | ICD-10-CM | POA: Diagnosis not present

## 2020-09-16 DIAGNOSIS — E559 Vitamin D deficiency, unspecified: Secondary | ICD-10-CM | POA: Diagnosis not present

## 2020-09-16 DIAGNOSIS — N1831 Chronic kidney disease, stage 3a: Secondary | ICD-10-CM | POA: Diagnosis not present

## 2020-09-16 DIAGNOSIS — I129 Hypertensive chronic kidney disease with stage 1 through stage 4 chronic kidney disease, or unspecified chronic kidney disease: Secondary | ICD-10-CM | POA: Diagnosis not present

## 2020-09-16 DIAGNOSIS — Z716 Tobacco abuse counseling: Secondary | ICD-10-CM | POA: Diagnosis not present

## 2020-09-16 DIAGNOSIS — T1490XA Injury, unspecified, initial encounter: Secondary | ICD-10-CM | POA: Diagnosis not present

## 2020-09-26 ENCOUNTER — Telehealth: Payer: Self-pay

## 2020-09-26 ENCOUNTER — Other Ambulatory Visit: Payer: Self-pay

## 2020-09-26 ENCOUNTER — Ambulatory Visit (INDEPENDENT_AMBULATORY_CARE_PROVIDER_SITE_OTHER): Payer: Medicare Other

## 2020-09-26 ENCOUNTER — Ambulatory Visit: Payer: Medicare Other

## 2020-09-26 DIAGNOSIS — I639 Cerebral infarction, unspecified: Secondary | ICD-10-CM

## 2020-09-26 DIAGNOSIS — G464 Cerebellar stroke syndrome: Secondary | ICD-10-CM

## 2020-09-26 NOTE — Telephone Encounter (Signed)
Notified by Elwyn Reach that patient had not activated monitor.Patient states he didn't know what to do.I asked him to com in now and I will place it on him.

## 2020-09-27 DIAGNOSIS — N1831 Chronic kidney disease, stage 3a: Secondary | ICD-10-CM | POA: Diagnosis not present

## 2020-09-27 DIAGNOSIS — E6609 Other obesity due to excess calories: Secondary | ICD-10-CM | POA: Diagnosis not present

## 2020-09-27 DIAGNOSIS — R809 Proteinuria, unspecified: Secondary | ICD-10-CM | POA: Diagnosis not present

## 2020-09-27 DIAGNOSIS — I129 Hypertensive chronic kidney disease with stage 1 through stage 4 chronic kidney disease, or unspecified chronic kidney disease: Secondary | ICD-10-CM | POA: Diagnosis not present

## 2020-09-30 NOTE — Addendum Note (Signed)
Addended by: Levonne Hubert on: 09/30/2020 09:07 AM   Modules accepted: Orders

## 2020-09-30 NOTE — Addendum Note (Signed)
Addended by: Levonne Hubert on: 09/30/2020 09:17 AM   Modules accepted: Orders

## 2020-10-14 ENCOUNTER — Encounter (INDEPENDENT_AMBULATORY_CARE_PROVIDER_SITE_OTHER): Payer: Self-pay | Admitting: Internal Medicine

## 2020-10-14 ENCOUNTER — Telehealth (INDEPENDENT_AMBULATORY_CARE_PROVIDER_SITE_OTHER): Payer: Medicare Other | Admitting: Internal Medicine

## 2020-10-14 VITALS — Ht 72.0 in | Wt 265.0 lb

## 2020-10-14 DIAGNOSIS — J069 Acute upper respiratory infection, unspecified: Secondary | ICD-10-CM | POA: Diagnosis not present

## 2020-10-14 NOTE — Progress Notes (Signed)
Metrics: Intervention Frequency ACO  Documented Smoking Status Yearly  Screened one or more times in 24 months  Cessation Counseling or  Active cessation medication Past 24 months  Past 24 months   Guideline developer: UpToDate (See UpToDate for funding source) Date Released: 2014       Wellness Office Visit  Subjective:  Patient ID: Matthew Wise, male    DOB: March 24, 1961  Age: 59 y.o. MRN: [id number]  CC: This is an audio telemedicine visit with the permission of the patient who is at home and I am in my office.  I used 2 identifiers to identify the patient. Runny nose. HPI  This patient has had symptoms of runny nose, nonproductive cough for the last 2 weeks.  He denies any fever, myalgias, dyspnea.  He has not been in contact with anyone who has had similar symptoms.  He has had COVID-19 vaccination. Past Medical History:  Diagnosis Date  . Acute CVA (cerebrovascular accident) ([place]) 09/09/2020  . Chronic anticoagulation 11/14/2014  . CKD (chronic kidney disease) stage 3, GFR 30-59 ml/min (HCC) 10/24/2019  . CKD (chronic kidney disease) stage 3, GFR 30-59 ml/min (HCC) 10/24/2019  . Clotting disorder ([place])   . 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 DVT (deep venous thrombosis) ([place]) 06/15/2011  . DVT of leg (deep venous thrombosis) (HCC)    rt. leg  . H/O PE (pulmonary embolism) 06/15/2011  . 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 Prediabetes 10/24/2019   Past Surgical History:  Procedure Laterality Date  . COLONOSCOPY WITH PROPOFOL N/A 10/29/2015   Procedure: COLONOSCOPY WITH PROPOFOL;  Surgeon: Danie Binder, MD;  Location: [facility];  Service: Endoscopy;  Laterality: N/A;  930  . None to date  10/07/15  . TEE WITHOUT CARDIOVERSION N/A 09/10/2020   Procedure: TRANSESOPHAGEAL ECHOCARDIOGRAM (TEE) WITH PROPOFOL;  Surgeon: Satira Sark, MD;  Location: [facility] ORS;   Service: Cardiovascular;  Laterality: N/A;     Family History  Problem Relation Age of Onset  . COPD Father   . Stroke Brother   . Pulmonary embolism Sister   . Throat cancer Brother   . Arthritis Mother   . Diabetes Mother   . Stroke Mother   . Hypertension Brother   . Diabetes Brother   . Arthritis Brother   . Diabetes Sister   . Colon cancer Neg Hx     Social History   Social History Narrative   Lives with son   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s   Mows lawns some   Social History   Tobacco Use  . Smoking status: Current Every Day Smoker    Packs/day: 1.00    Years: 30.00    Pack years: 30.00    Types: Cigarettes    Start date: 12/01/1983  . Smokeless tobacco: Never Used  . Tobacco comment: counseled  Substance Use Topics  . Alcohol use: Yes    Alcohol/week: 3.0 standard drinks    Types: 3 Cans of beer per week    Comment: 2-3 beers a day    Current Meds  Medication Sig  . amLODipine (NORVASC) 5 MG tablet Take 5 mg by mouth daily.  Marland Kitchen atorvastatin (LIPITOR) 40 MG tablet Take 1 tablet (40 mg total) by mouth daily.  . rivaroxaban (XARELTO) 20 MG TABS tablet Take by mouth.      Depression screen [facility] 2/9 01/30/2020  12/05/2019 09/14/2017 09/28/2014  Decreased Interest 0 0 0 0  Down, Depressed, Hopeless 0 0 0 0  PHQ - 2 Score 0 0 0 0  Some recent data might be hidden     Objective:   Today's Vitals: Ht 6' (1.829 m)   Wt 265 lb (120.2 kg)   BMI 35.94 kg/m  Vitals with BMI 10/14/2020 09/11/2020 09/11/2020  Height 6\' 0"  - -  Weight 265 lbs - -  BMI 12.45 - -  Systolic (No Data) [phone]  Diastolic (No Data) 91 86  Pulse (No Data) 56 66     Physical Exam  Virtual visit.  He appears alert and orientated on the phone.     Assessment   1. URTI (acute upper respiratory infection)       Tests ordered No orders of the defined types were placed in this encounter.    Plan: 1. His symptoms appear to be more of a viral origin, probably  the common cold but this may be other origins also.  I have told him that he should probably get COVID-19 test to make sure he does not have COVID-19 disease.  If his symptoms worsen especially with fevers, shortness of breath, productive cough, he should call us back. 2. I have made up a follow-up appointment with him to see Judson Roch in December. 3. This phone call lasted 5 minutes.   No orders of the defined types were placed in this encounter.   Doree Albee, MD

## 2020-11-18 ENCOUNTER — Telehealth (INDEPENDENT_AMBULATORY_CARE_PROVIDER_SITE_OTHER): Payer: Self-pay

## 2020-11-18 ENCOUNTER — Other Ambulatory Visit (INDEPENDENT_AMBULATORY_CARE_PROVIDER_SITE_OTHER): Payer: Self-pay | Admitting: Internal Medicine

## 2020-11-18 MED ORDER — RIVAROXABAN 20 MG PO TABS
20.0000 mg | ORAL_TABLET | Freq: Every day | ORAL | 3 refills | Status: DC
Start: 2020-11-18 — End: 2020-11-20

## 2020-11-18 NOTE — Telephone Encounter (Signed)
Patient called and left a detailed voice message that he is out of the following medication and needs a refill:  rivaroxaban (XARELTO) 20 MG TABS tablet   Last OV 10/14/2020  Next OV 11/20/2020  Please send to [facility].

## 2020-11-20 ENCOUNTER — Encounter (INDEPENDENT_AMBULATORY_CARE_PROVIDER_SITE_OTHER): Payer: Self-pay | Admitting: Nurse Practitioner

## 2020-11-20 ENCOUNTER — Other Ambulatory Visit: Payer: Self-pay

## 2020-11-20 ENCOUNTER — Ambulatory Visit (INDEPENDENT_AMBULATORY_CARE_PROVIDER_SITE_OTHER): Payer: Medicare Other | Admitting: Nurse Practitioner

## 2020-11-20 VITALS — BP 124/78 | HR 59 | Temp 97.5°F | Ht 72.0 in | Wt 272.0 lb

## 2020-11-20 DIAGNOSIS — J309 Allergic rhinitis, unspecified: Secondary | ICD-10-CM | POA: Diagnosis not present

## 2020-11-20 DIAGNOSIS — N1831 Chronic kidney disease, stage 3a: Secondary | ICD-10-CM

## 2020-11-20 DIAGNOSIS — Z1211 Encounter for screening for malignant neoplasm of colon: Secondary | ICD-10-CM | POA: Diagnosis not present

## 2020-11-20 DIAGNOSIS — I10 Essential (primary) hypertension: Secondary | ICD-10-CM

## 2020-11-20 DIAGNOSIS — R7303 Prediabetes: Secondary | ICD-10-CM | POA: Diagnosis not present

## 2020-11-20 DIAGNOSIS — Z7901 Long term (current) use of anticoagulants: Secondary | ICD-10-CM | POA: Diagnosis not present

## 2020-11-20 MED ORDER — ATORVASTATIN CALCIUM 40 MG PO TABS: 40.0000 mg | ORAL_TABLET | Freq: Every day | ORAL | 0 refills | Status: DC

## 2020-11-20 MED ORDER — RIVAROXABAN 20 MG PO TABS: 20.0000 mg | ORAL_TABLET | Freq: Every day | ORAL | 3 refills | Status: DC

## 2020-11-20 MED ORDER — AMLODIPINE BESYLATE 5 MG PO TABS: 5.0000 mg | ORAL_TABLET | Freq: Every day | ORAL | 0 refills | Status: DC

## 2020-11-20 NOTE — Progress Notes (Signed)
Subjective:  Patient ID: Matthew Wise, male    DOB: [birth year]  Age: 59 y.o. MRN: [id number]  CC:  Chief Complaint  Patient presents with  . Follow-up    Hasn't been in here in a while, needs refills, states he is doing good  . Hypertension  . History of DVT  . Chronic Kidney Disease  . Prediabetes      HPI  This patient arrives today for the above.  Hypertension: Amlodipine was recently increased from 2.5 to 5 mg daily.  The patient tells me is tolerating this well.  History of DVT: He is on Xarelto daily.  He tells me is tolerating medication well he denies any signs of bleeding.  Chronic kidney disease: He does follow with nephrology.  Most recent creatinine clearance based on last metabolic panel was approximately 68.  Allergic rhinitis: He has been experiencing some rhinorrhea that has been going on for approximately 1 month.  He has not been tested for COVID-19, but denies any fever, body aches, cough, shortness of breath.  He has tried over-the-counter decongestants and allergy medications.  He most recently started trying Flonase nasal spray approximately 2 days ago.  He denies any headache, sinus pain, fever.  Prediabetes: He does have a history of prediabetes.  Last A1c was collected in October of this year and it was 5.3.   Of note, he has been referred to gastroenterologist in the area to undergo colon cancer screening.  He has not followed up with these referrals as of yet.  Per chart review I do see that he had his last colonoscopy in 2016 and it does mention that he would need a repeat colonoscopy 6 months after that.  He does not recall completing the repeat colonoscopy.  Polyps were noted during previous colonoscopy.  Past Medical History:  Diagnosis Date  . Acute CVA (cerebrovascular accident) ([place]) 09/09/2020  . Chronic anticoagulation 11/14/2014  . CKD (chronic kidney disease) stage 3, GFR 30-59 ml/min (HCC) 10/24/2019  . CKD (chronic kidney  disease) stage 3, GFR 30-59 ml/min (HCC) 10/24/2019  . Clotting disorder ([place])   . 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 DVT (deep venous thrombosis) ([place]) 06/15/2011  . DVT of leg (deep venous thrombosis) (HCC)    rt. leg  . H/O PE (pulmonary embolism) 06/15/2011  . 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 Prediabetes 10/24/2019      Family History  Problem Relation Age of Onset  . COPD Father   . Stroke Brother   . Pulmonary embolism Sister   . Throat cancer Brother   . Arthritis Mother   . Diabetes Mother   . Stroke Mother   . Hypertension Brother   . Diabetes Brother   . Arthritis Brother   . Diabetes Sister   . Colon cancer Neg Hx     Social History   Social History Narrative   Lives with son   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s   Mows lawns some   Social History   Tobacco Use  . Smoking status: Current Every Day Smoker    Packs/day: 1.00    Years: 30.00    Pack years: 30.00    Types: Cigarettes    Start date: 12/01/1983  . Smokeless tobacco: Never Used  . Tobacco comment: counseled  Substance Use Topics  . Alcohol use: Yes  Alcohol/week: 3.0 standard drinks    Types: 3 Cans of beer per week    Comment: 2-3 beers a day     Current Meds  Medication Sig  . fexofenadine (ALLEGRA) 180 MG tablet Take 180 mg by mouth every other day.  . fluticasone (FLONASE) 50 MCG/ACT nasal spray Place into both nostrils daily.  . [DISCONTINUED] amLODipine (NORVASC) 5 MG tablet Take 5 mg by mouth daily.  . [DISCONTINUED] atorvastatin (LIPITOR) 40 MG tablet Take 1 tablet (40 mg total) by mouth daily.  . [DISCONTINUED] rivaroxaban (XARELTO) 20 MG TABS tablet Take 1 tablet (20 mg total) by mouth daily with supper.    ROS:  Review of Systems  Constitutional: Negative for chills, fever and malaise/fatigue.  HENT: Positive for congestion.   Respiratory: Negative for  cough and shortness of breath.   Cardiovascular: Negative for chest pain.  Gastrointestinal: Negative for abdominal pain, blood in stool, nausea and vomiting.  Neurological: Negative for dizziness and headaches.     Objective:   Today's Vitals: BP 124/78   Pulse (!) 59   Temp (!) 97.5 F (36.4 C) (Temporal)   Ht 6' (1.829 m)   Wt 272 lb (123.4 kg)   BMI 36.89 kg/m  Vitals with BMI 11/20/2020 10/14/2020 09/11/2020  Height 6\' 0"  6\' 0"  -  Weight 272 lbs 265 lbs -  BMI 59.56 38.75 -  Systolic 643 (No Data) 329  Diastolic 78 (No Data) 91  Pulse 59 (No Data) 56     Physical Exam Vitals reviewed.  Constitutional:      Appearance: Normal appearance.  HENT:     Head: Normocephalic and atraumatic.  Cardiovascular:     Rate and Rhythm: Normal rate and regular rhythm.  Pulmonary:     Effort: Pulmonary effort is normal.     Breath sounds: Normal breath sounds.  Musculoskeletal:     Cervical back: Neck supple.  Skin:    General: Skin is warm and dry.  Neurological:     Mental Status: He is alert and oriented to person, place, and time.  Psychiatric:        Mood and Affect: Mood normal.        Behavior: Behavior normal.        Thought Content: Thought content normal.        Judgment: Judgment normal.          Assessment and Plan   1. Colon cancer screening   2. Stage 3a chronic kidney disease ([place])   3. Chronic anticoagulation   4. Prediabetes   5. Hypertension, unspecified type   6. Allergic rhinitis, unspecified seasonality, unspecified trigger      Plan: 1.  I did discuss that repeat colonoscopy was recommended based on report from last colonoscopy in 2016.  It was recommended to be done 6 months after that 1.  I encouraged him to consider getting colon cancer screening completed as soon as possible and because he has a history of polyps I would prefer that he undergo colonoscopy via other less invasive testing such as Cologuard.  He tells me he is agreeable to  this so I will order referral again today. 2.  He will follow-up with his nephrologist as scheduled. 3.  He will continue his Xarelto will send refills today. 4.  We will hold off on collecting A1c today, but may consider this in the future. 5.  Blood pressure well controlled on amlodipine at 5 mg daily so continue taking this medication as  currently dosed. 6.  I encouraged him to continue taking his Flonase nasal spray and to add a daily over-the-counter allergy medication.  I recommended he take Allegra either 1/2 tablet every day or 1 tablet every other day based on his reduced kidney function.  He tells me he will consider this.  I recommended that he also consider getting tested for COVID-19, because his symptoms have persisted for about a month I doubt that it is COVID-19 infection however symptom severity does vary.  I recommend that if symptoms persist despite using the over-the-counter allergy pill as well as Flonase that he call this office in 2 weeks at which point we can complete a telemetry visit to determine next steps.   Tests ordered Orders Placed This Encounter  Procedures  . Ambulatory referral to Gastroenterology      Meds ordered this encounter  Medications  . amLODipine (NORVASC) 5 MG tablet    Sig: Take 1 tablet (5 mg total) by mouth daily.    Dispense:  90 tablet    Refill:  0    Order Specific Question:   Supervising Provider    Answer:   Hurshel Party C [id number]  . atorvastatin (LIPITOR) 40 MG tablet    Sig: Take 1 tablet (40 mg total) by mouth daily.    Dispense:  90 tablet    Refill:  0    Order Specific Question:   Supervising Provider    Answer:   Hurshel Party C [id number]  . rivaroxaban (XARELTO) 20 MG TABS tablet    Sig: Take 1 tablet (20 mg total) by mouth daily with supper.    Dispense:  30 tablet    Refill:  3    Order Specific Question:   Supervising Provider    Answer:   Doree Albee [id number]    Patient to follow-up in 3 months or sooner as  needed.  Ailene Ards, NP

## 2020-11-27 ENCOUNTER — Encounter: Payer: Self-pay | Admitting: Internal Medicine

## 2020-11-27 ENCOUNTER — Encounter (HOSPITAL_COMMUNITY): Payer: Self-pay | Admitting: *Deleted

## 2020-11-27 ENCOUNTER — Other Ambulatory Visit: Payer: Self-pay

## 2020-11-27 DIAGNOSIS — Z7901 Long term (current) use of anticoagulants: Secondary | ICD-10-CM

## 2020-11-27 DIAGNOSIS — R4781 Slurred speech: Secondary | ICD-10-CM | POA: Diagnosis not present

## 2020-11-27 DIAGNOSIS — R531 Weakness: Secondary | ICD-10-CM | POA: Diagnosis not present

## 2020-11-27 DIAGNOSIS — Z86718 Personal history of other venous thrombosis and embolism: Secondary | ICD-10-CM

## 2020-11-27 DIAGNOSIS — Z20822 Contact with and (suspected) exposure to covid-19: Secondary | ICD-10-CM | POA: Diagnosis present

## 2020-11-27 DIAGNOSIS — Z8249 Family history of ischemic heart disease and other diseases of the circulatory system: Secondary | ICD-10-CM

## 2020-11-27 DIAGNOSIS — Z833 Family history of diabetes mellitus: Secondary | ICD-10-CM

## 2020-11-27 DIAGNOSIS — Z9114 Patient's other noncompliance with medication regimen: Secondary | ICD-10-CM

## 2020-11-27 DIAGNOSIS — E782 Mixed hyperlipidemia: Secondary | ICD-10-CM | POA: Diagnosis present

## 2020-11-27 DIAGNOSIS — I129 Hypertensive chronic kidney disease with stage 1 through stage 4 chronic kidney disease, or unspecified chronic kidney disease: Secondary | ICD-10-CM | POA: Diagnosis present

## 2020-11-27 DIAGNOSIS — F17210 Nicotine dependence, cigarettes, uncomplicated: Secondary | ICD-10-CM | POA: Diagnosis present

## 2020-11-27 DIAGNOSIS — I63511 Cerebral infarction due to unspecified occlusion or stenosis of right middle cerebral artery: Secondary | ICD-10-CM | POA: Diagnosis not present

## 2020-11-27 DIAGNOSIS — F1290 Cannabis use, unspecified, uncomplicated: Secondary | ICD-10-CM | POA: Diagnosis present

## 2020-11-27 DIAGNOSIS — I639 Cerebral infarction, unspecified: Secondary | ICD-10-CM | POA: Diagnosis not present

## 2020-11-27 DIAGNOSIS — I10 Essential (primary) hypertension: Secondary | ICD-10-CM | POA: Diagnosis not present

## 2020-11-27 DIAGNOSIS — R4701 Aphasia: Secondary | ICD-10-CM | POA: Diagnosis present

## 2020-11-27 DIAGNOSIS — Z8673 Personal history of transient ischemic attack (TIA), and cerebral infarction without residual deficits: Secondary | ICD-10-CM

## 2020-11-27 DIAGNOSIS — R414 Neurologic neglect syndrome: Secondary | ICD-10-CM | POA: Diagnosis present

## 2020-11-27 DIAGNOSIS — Z86711 Personal history of pulmonary embolism: Secondary | ICD-10-CM

## 2020-11-27 DIAGNOSIS — Z8261 Family history of arthritis: Secondary | ICD-10-CM

## 2020-11-27 DIAGNOSIS — R7303 Prediabetes: Secondary | ICD-10-CM | POA: Diagnosis present

## 2020-11-27 DIAGNOSIS — R001 Bradycardia, unspecified: Secondary | ICD-10-CM | POA: Diagnosis present

## 2020-11-27 DIAGNOSIS — D689 Coagulation defect, unspecified: Secondary | ICD-10-CM | POA: Diagnosis present

## 2020-11-27 DIAGNOSIS — E669 Obesity, unspecified: Secondary | ICD-10-CM | POA: Diagnosis present

## 2020-11-27 DIAGNOSIS — D6851 Activated protein C resistance: Secondary | ICD-10-CM | POA: Diagnosis present

## 2020-11-27 DIAGNOSIS — D573 Sickle-cell trait: Secondary | ICD-10-CM | POA: Diagnosis present

## 2020-11-27 DIAGNOSIS — Z6834 Body mass index (BMI) 34.0-34.9, adult: Secondary | ICD-10-CM

## 2020-11-27 DIAGNOSIS — R200 Anesthesia of skin: Secondary | ICD-10-CM | POA: Diagnosis present

## 2020-11-27 DIAGNOSIS — Z808 Family history of malignant neoplasm of other organs or systems: Secondary | ICD-10-CM

## 2020-11-27 DIAGNOSIS — N1832 Chronic kidney disease, stage 3b: Secondary | ICD-10-CM | POA: Diagnosis present

## 2020-11-27 DIAGNOSIS — R29810 Facial weakness: Secondary | ICD-10-CM | POA: Diagnosis present

## 2020-11-27 DIAGNOSIS — R42 Dizziness and giddiness: Secondary | ICD-10-CM | POA: Diagnosis present

## 2020-11-27 DIAGNOSIS — Z825 Family history of asthma and other chronic lower respiratory diseases: Secondary | ICD-10-CM

## 2020-11-27 DIAGNOSIS — R29703 NIHSS score 3: Secondary | ICD-10-CM | POA: Diagnosis present

## 2020-11-27 DIAGNOSIS — Z79899 Other long term (current) drug therapy: Secondary | ICD-10-CM

## 2020-11-27 DIAGNOSIS — G8220 Paraplegia, unspecified: Secondary | ICD-10-CM | POA: Diagnosis present

## 2020-11-27 DIAGNOSIS — Z823 Family history of stroke: Secondary | ICD-10-CM

## 2020-11-27 NOTE — ED Triage Notes (Signed)
Pt with recent CVA a month ago.  Pt states family wanted pt checked out due to slurred speech and facial droop but pt states he hasn't noted anything different. Pt with facial droop noted on left, left arm slight drift.  No drift noted to bilateral legs. Reported that the left facial droop and slight left arm weakness is not new and from the CVA a month ago.

## 2020-11-28 ENCOUNTER — Observation Stay (HOSPITAL_COMMUNITY): Payer: Medicare Other

## 2020-11-28 ENCOUNTER — Encounter (HOSPITAL_COMMUNITY): Payer: Self-pay | Admitting: Internal Medicine

## 2020-11-28 ENCOUNTER — Emergency Department (HOSPITAL_COMMUNITY): Payer: Medicare Other

## 2020-11-28 ENCOUNTER — Inpatient Hospital Stay (HOSPITAL_COMMUNITY)
Admission: EM | Admit: 2020-11-28 | Discharge: 2020-12-04 | DRG: 065 | Disposition: A | Discharge status: Home Health | Payer: Medicare Other | Attending: Family Medicine | Admitting: Family Medicine

## 2020-11-28 DIAGNOSIS — N1832 Chronic kidney disease, stage 3b: Secondary | ICD-10-CM | POA: Diagnosis not present

## 2020-11-28 DIAGNOSIS — I63411 Cerebral infarction due to embolism of right middle cerebral artery: Secondary | ICD-10-CM | POA: Diagnosis not present

## 2020-11-28 DIAGNOSIS — R4781 Slurred speech: Secondary | ICD-10-CM | POA: Diagnosis not present

## 2020-11-28 DIAGNOSIS — I63311 Cerebral infarction due to thrombosis of right middle cerebral artery: Secondary | ICD-10-CM | POA: Diagnosis not present

## 2020-11-28 DIAGNOSIS — Z8249 Family history of ischemic heart disease and other diseases of the circulatory system: Secondary | ICD-10-CM | POA: Diagnosis not present

## 2020-11-28 DIAGNOSIS — Z823 Family history of stroke: Secondary | ICD-10-CM | POA: Diagnosis not present

## 2020-11-28 DIAGNOSIS — I2699 Other pulmonary embolism without acute cor pulmonale: Secondary | ICD-10-CM | POA: Diagnosis present

## 2020-11-28 DIAGNOSIS — I10 Essential (primary) hypertension: Secondary | ICD-10-CM

## 2020-11-28 DIAGNOSIS — I639 Cerebral infarction, unspecified: Secondary | ICD-10-CM | POA: Diagnosis not present

## 2020-11-28 DIAGNOSIS — Z808 Family history of malignant neoplasm of other organs or systems: Secondary | ICD-10-CM | POA: Diagnosis not present

## 2020-11-28 DIAGNOSIS — I340 Nonrheumatic mitral (valve) insufficiency: Secondary | ICD-10-CM | POA: Diagnosis not present

## 2020-11-28 DIAGNOSIS — R29810 Facial weakness: Secondary | ICD-10-CM | POA: Diagnosis present

## 2020-11-28 DIAGNOSIS — Z8679 Personal history of other diseases of the circulatory system: Secondary | ICD-10-CM

## 2020-11-28 DIAGNOSIS — Z86711 Personal history of pulmonary embolism: Secondary | ICD-10-CM | POA: Diagnosis not present

## 2020-11-28 DIAGNOSIS — Z9114 Patient's other noncompliance with medication regimen: Secondary | ICD-10-CM | POA: Diagnosis not present

## 2020-11-28 DIAGNOSIS — E782 Mixed hyperlipidemia: Secondary | ICD-10-CM

## 2020-11-28 DIAGNOSIS — Z825 Family history of asthma and other chronic lower respiratory diseases: Secondary | ICD-10-CM | POA: Diagnosis not present

## 2020-11-28 DIAGNOSIS — I82409 Acute embolism and thrombosis of unspecified deep veins of unspecified lower extremity: Secondary | ICD-10-CM | POA: Diagnosis present

## 2020-11-28 DIAGNOSIS — I351 Nonrheumatic aortic (valve) insufficiency: Secondary | ICD-10-CM | POA: Diagnosis not present

## 2020-11-28 DIAGNOSIS — Z20822 Contact with and (suspected) exposure to covid-19: Secondary | ICD-10-CM | POA: Diagnosis present

## 2020-11-28 DIAGNOSIS — I6389 Other cerebral infarction: Secondary | ICD-10-CM | POA: Diagnosis not present

## 2020-11-28 DIAGNOSIS — Z86718 Personal history of other venous thrombosis and embolism: Secondary | ICD-10-CM | POA: Diagnosis not present

## 2020-11-28 DIAGNOSIS — D573 Sickle-cell trait: Secondary | ICD-10-CM | POA: Diagnosis present

## 2020-11-28 DIAGNOSIS — R7303 Prediabetes: Secondary | ICD-10-CM | POA: Diagnosis present

## 2020-11-28 DIAGNOSIS — E785 Hyperlipidemia, unspecified: Secondary | ICD-10-CM

## 2020-11-28 DIAGNOSIS — D689 Coagulation defect, unspecified: Secondary | ICD-10-CM | POA: Diagnosis present

## 2020-11-28 DIAGNOSIS — F1290 Cannabis use, unspecified, uncomplicated: Secondary | ICD-10-CM | POA: Diagnosis present

## 2020-11-28 DIAGNOSIS — I63511 Cerebral infarction due to unspecified occlusion or stenosis of right middle cerebral artery: Secondary | ICD-10-CM | POA: Diagnosis not present

## 2020-11-28 DIAGNOSIS — Z833 Family history of diabetes mellitus: Secondary | ICD-10-CM | POA: Diagnosis not present

## 2020-11-28 DIAGNOSIS — R414 Neurologic neglect syndrome: Secondary | ICD-10-CM | POA: Diagnosis present

## 2020-11-28 DIAGNOSIS — D6851 Activated protein C resistance: Secondary | ICD-10-CM | POA: Diagnosis present

## 2020-11-28 DIAGNOSIS — F17200 Nicotine dependence, unspecified, uncomplicated: Secondary | ICD-10-CM | POA: Diagnosis present

## 2020-11-28 DIAGNOSIS — Z8673 Personal history of transient ischemic attack (TIA), and cerebral infarction without residual deficits: Secondary | ICD-10-CM | POA: Diagnosis not present

## 2020-11-28 DIAGNOSIS — Z91148 Patient's other noncompliance with medication regimen for other reason: Secondary | ICD-10-CM

## 2020-11-28 DIAGNOSIS — N183 Chronic kidney disease, stage 3 unspecified: Secondary | ICD-10-CM | POA: Diagnosis present

## 2020-11-28 DIAGNOSIS — R4701 Aphasia: Secondary | ICD-10-CM | POA: Diagnosis present

## 2020-11-28 DIAGNOSIS — G8220 Paraplegia, unspecified: Secondary | ICD-10-CM | POA: Diagnosis present

## 2020-11-28 DIAGNOSIS — Z8261 Family history of arthritis: Secondary | ICD-10-CM | POA: Diagnosis not present

## 2020-11-28 DIAGNOSIS — F17210 Nicotine dependence, cigarettes, uncomplicated: Secondary | ICD-10-CM | POA: Diagnosis present

## 2020-11-28 HISTORY — DX: Essential (primary) hypertension: I10

## 2020-11-28 HISTORY — DX: Hyperlipidemia, unspecified: E78.5

## 2020-11-28 LAB — URINALYSIS, ROUTINE W REFLEX MICROSCOPIC
Bilirubin Urine: NEGATIVE
Glucose, UA: NEGATIVE mg/dL
Hgb urine dipstick: NEGATIVE
Ketones, ur: NEGATIVE mg/dL
Leukocytes,Ua: NEGATIVE
Nitrite: NEGATIVE
Protein, ur: NEGATIVE mg/dL
Specific Gravity, Urine: 1.023 (ref 1.005–1.030)
pH: 6 (ref 5.0–8.0)

## 2020-11-28 LAB — COMPREHENSIVE METABOLIC PANEL
ALT: 25 U/L (ref 0–44)
AST: 24 U/L (ref 15–41)
Albumin: 4.4 g/dL (ref 3.5–5.0)
Alkaline Phosphatase: 79 U/L (ref 38–126)
Anion gap: 8 (ref 5–15)
BUN: 16 mg/dL (ref 6–20)
CO2: 27 mmol/L (ref 22–32)
Calcium: 9.5 mg/dL (ref 8.9–10.3)
Chloride: 101 mmol/L (ref 98–111)
Creatinine, Ser: 1.76 mg/dL — ABNORMAL HIGH (ref 0.61–1.24)
GFR, Estimated: 44 mL/min — ABNORMAL LOW (ref >60)
Glucose, Bld: 104 mg/dL — ABNORMAL HIGH (ref 70–99)
Potassium: 3.8 mmol/L (ref 3.5–5.1)
Sodium: 136 mmol/L (ref 135–145)
Total Bilirubin: 1.1 mg/dL (ref 0.3–1.2)
Total Protein: 8 g/dL (ref 6.5–8.1)

## 2020-11-28 LAB — RAPID URINE DRUG SCREEN, HOSP PERFORMED
Amphetamines: NOT DETECTED
Barbiturates: NOT DETECTED
Benzodiazepines: NOT DETECTED
Cocaine: NOT DETECTED
Opiates: NOT DETECTED
Tetrahydrocannabinol: POSITIVE — AB

## 2020-11-28 LAB — DIFFERENTIAL
Abs Immature Granulocytes: 0.01 10*3/uL (ref 0.00–0.07)
Basophils Absolute: 0 10*3/uL (ref 0.0–0.1)
Basophils Relative: 1 %
Eosinophils Absolute: 0 10*3/uL (ref 0.0–0.5)
Eosinophils Relative: 1 %
Immature Granulocytes: 0 %
Lymphocytes Relative: 32 %
Lymphs Abs: 2 10*3/uL (ref 0.7–4.0)
Monocytes Absolute: 0.5 10*3/uL (ref 0.1–1.0)
Monocytes Relative: 9 %
Neutro Abs: 3.6 10*3/uL (ref 1.7–7.7)
Neutrophils Relative %: 57 %

## 2020-11-28 LAB — CBC
HCT: 41.3 % (ref 39.0–52.0)
Hemoglobin: 14 g/dL (ref 13.0–17.0)
MCH: 29.7 pg (ref 26.0–34.0)
MCHC: 33.9 g/dL (ref 30.0–36.0)
MCV: 87.5 fL (ref 80.0–100.0)
Platelets: 194 10*3/uL (ref 150–400)
RBC: 4.72 MIL/uL (ref 4.22–5.81)
RDW: 13.9 % (ref 11.5–15.5)
WBC: 6.2 10*3/uL (ref 4.0–10.5)
nRBC: 0 % (ref 0.0–0.2)

## 2020-11-28 LAB — ECHOCARDIOGRAM LIMITED
Area-P 1/2: 2.8 cm2
Height: 72 in
S' Lateral: 3.33 cm
Weight: 4208.14 oz

## 2020-11-28 LAB — PHOSPHORUS: Phosphorus: 3.3 mg/dL (ref 2.5–4.6)

## 2020-11-28 LAB — HEMOGLOBIN A1C
Hgb A1c MFr Bld: 6.2 % — ABNORMAL HIGH (ref 4.8–5.6)
Mean Plasma Glucose: 131.24 mg/dL

## 2020-11-28 LAB — LIPID PANEL
Cholesterol: 148 mg/dL (ref 0–200)
HDL: 71 mg/dL (ref >40)
LDL Cholesterol: 45 mg/dL (ref 0–99)
Total CHOL/HDL Ratio: 2.1 RATIO
Triglycerides: 159 mg/dL — ABNORMAL HIGH (ref <150)
VLDL: 32 mg/dL (ref 0–40)

## 2020-11-28 LAB — PROTIME-INR
INR: 1.9 — ABNORMAL HIGH (ref 0.8–1.2)
Prothrombin Time: 21.5 seconds — ABNORMAL HIGH (ref 11.4–15.2)

## 2020-11-28 LAB — ETHANOL: Alcohol, Ethyl (B): <10 mg/dL (ref <10)

## 2020-11-28 LAB — APTT: aPTT: 39 seconds — ABNORMAL HIGH (ref 24–36)

## 2020-11-28 LAB — MAGNESIUM: Magnesium: 2.3 mg/dL (ref 1.7–2.4)

## 2020-11-28 LAB — SARS CORONAVIRUS 2 (TAT 6-24 HRS): SARS Coronavirus 2: NEGATIVE

## 2020-11-28 IMAGING — CT CT HEAD W/O CM
3 series · 16 of 47 positions shown, 19 images · non-contrast
Comparison: None.

CLINICAL DATA: Slurred speech

EXAM:
CT HEAD WITHOUT CONTRAST
TECHNIQUE: Contiguous axial images were obtained from the base of the skull
through the vertex without intravenous contrast.

[Series 2: head w o · axial · 0.42mm/px · z∈[+1252,+1382]mm · 10 of 32 slices shown, 13 images]
[im 3/32  brain]
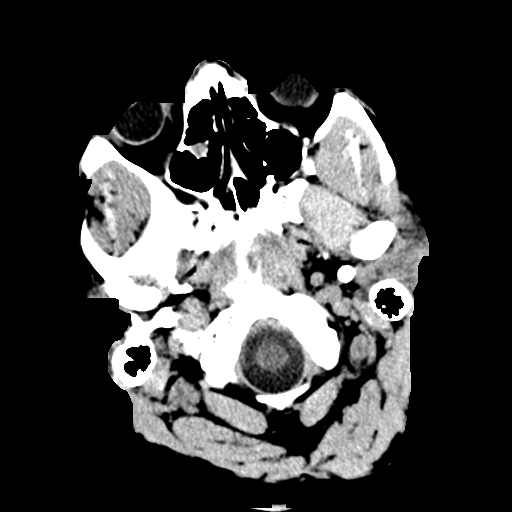
[im 3/32  bone]
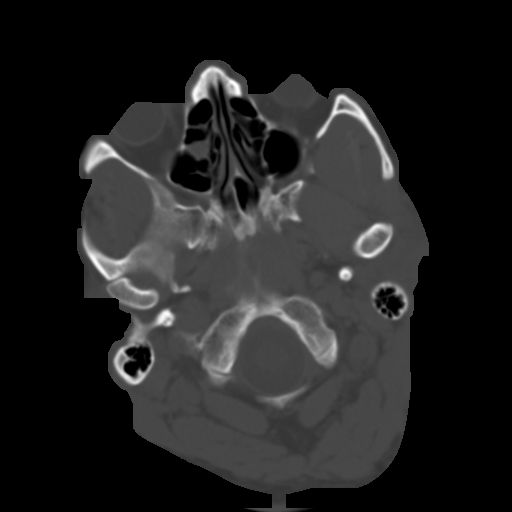
[im 6/32  brain]
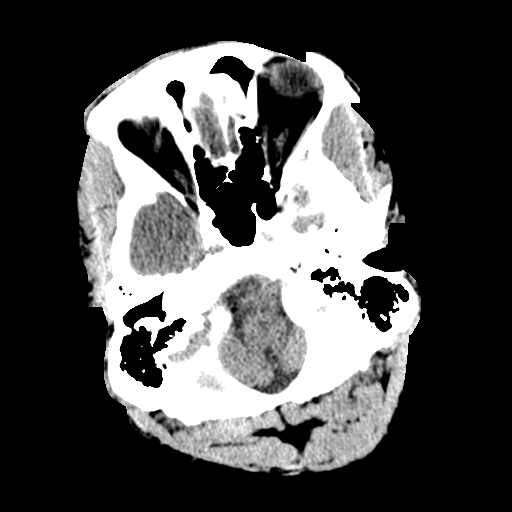
[im 9/32  brain]
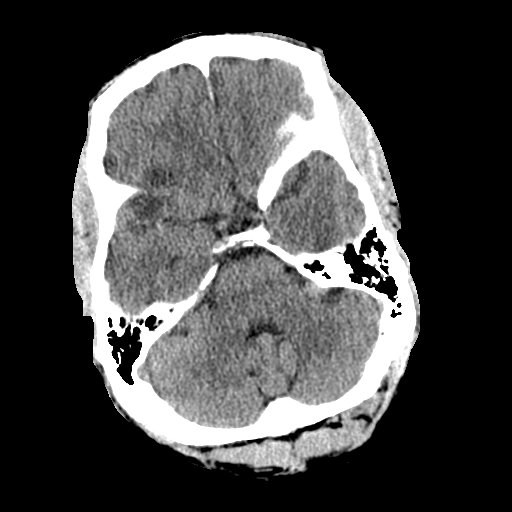
[im 11/32  brain]
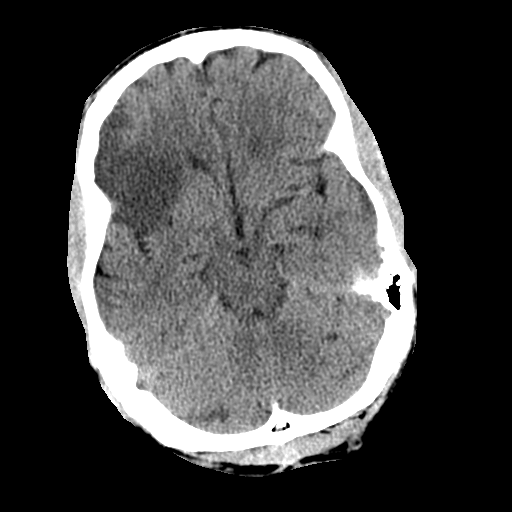
[im 14/32  brain]
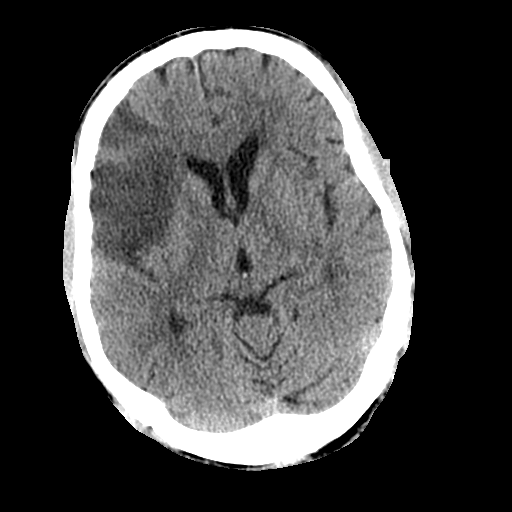
[im 14/32  bone]
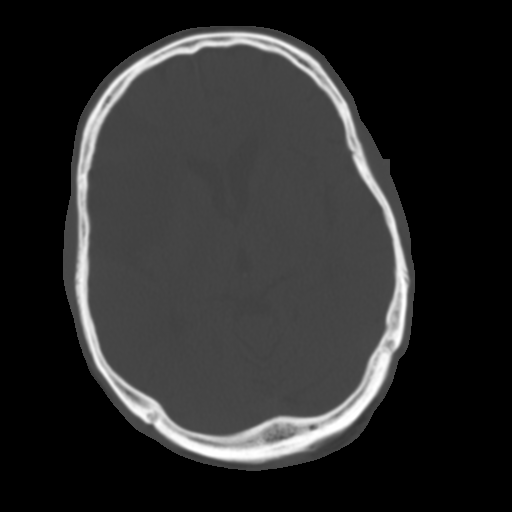
[im 18/32  brain]
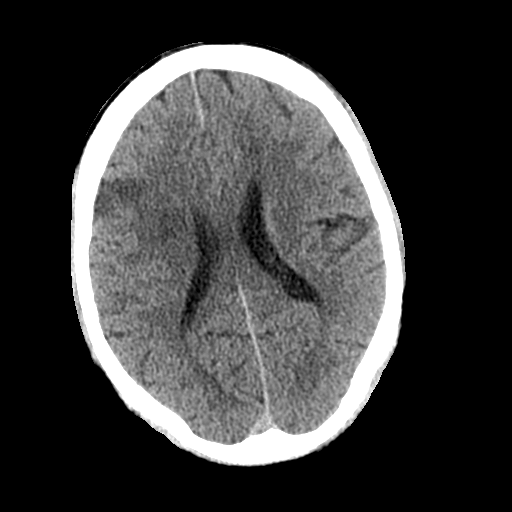
[im 21/32  brain]
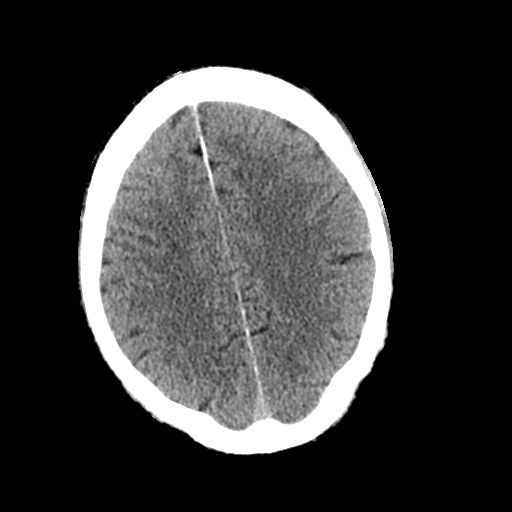
[im 24/32  brain]
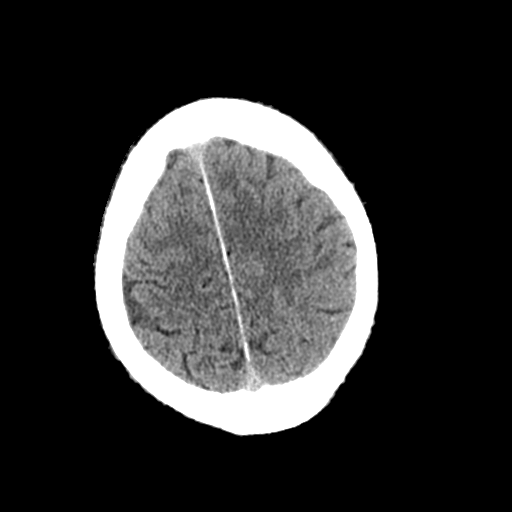
[im 26/32  brain]
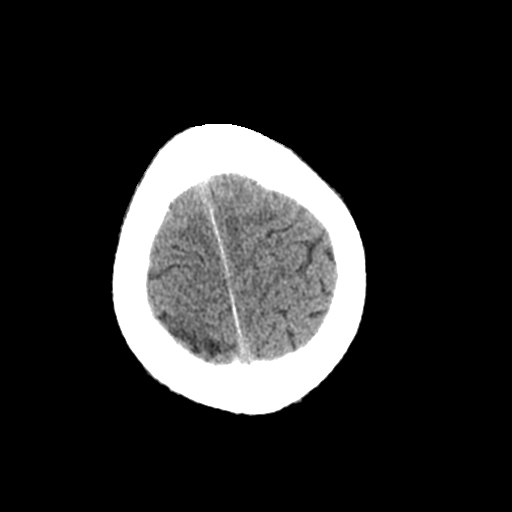
[im 26/32  bone]
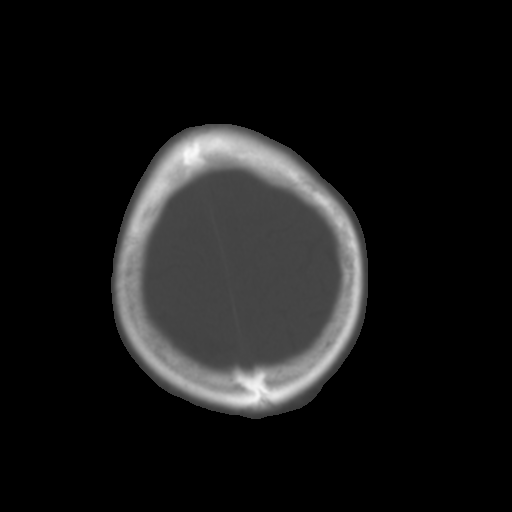
[im 29/32  brain]
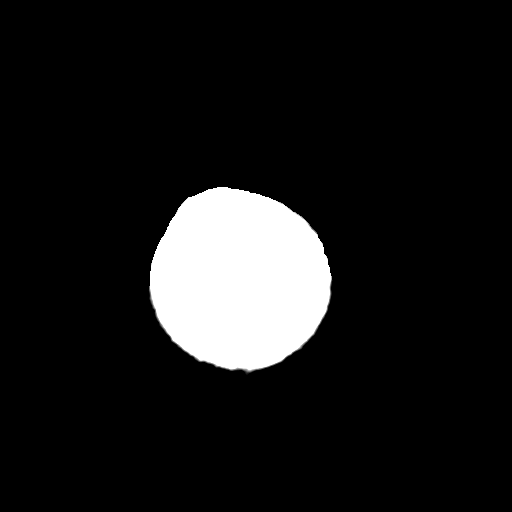

[Series 4: coronal soft · coronal · 0.35mm/px · 3 of 71 slices shown]
[im 24/71  brain]
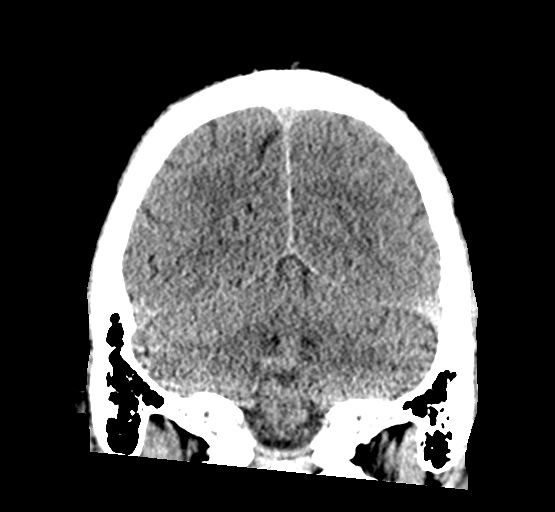
[im 32/71  brain]
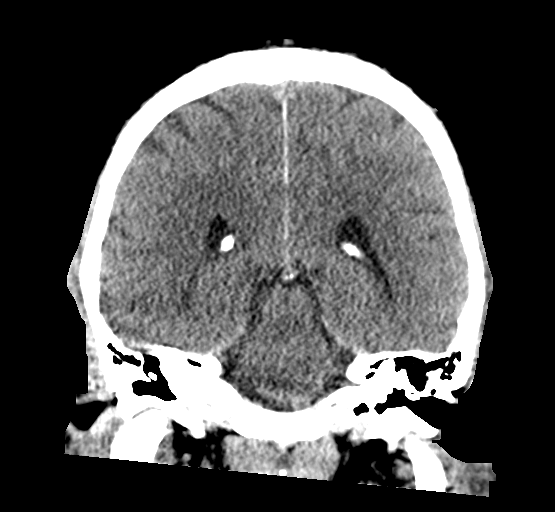
[im 39/71  brain]
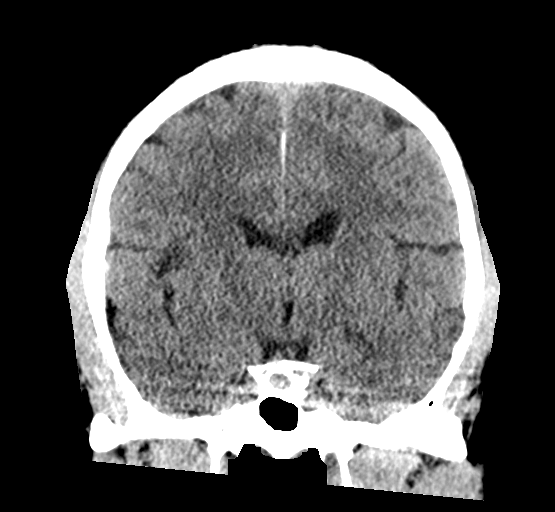

[Series 5: sagittal soft · sagittal · 0.34mm/px · 3 of 60 slices shown]
[im 20/60  brain]
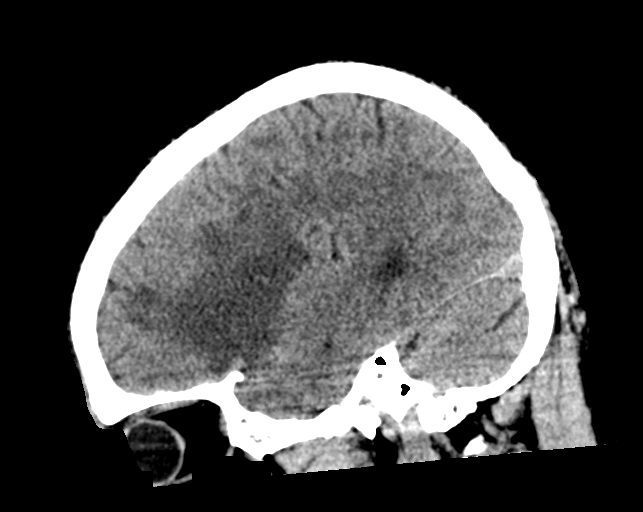
[im 30/60  brain]
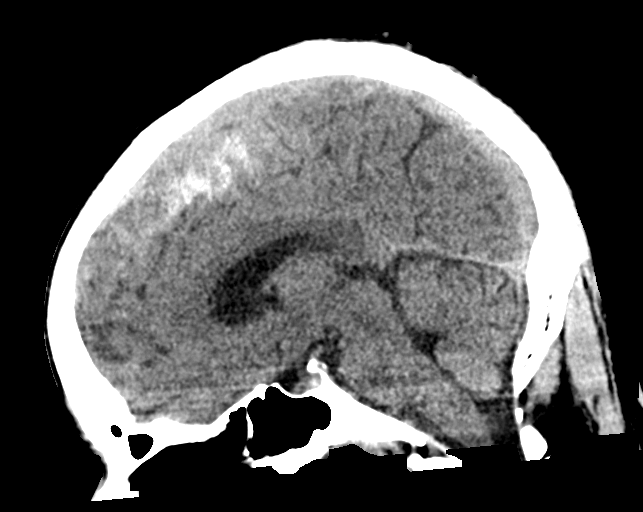
[im 40/60  brain]
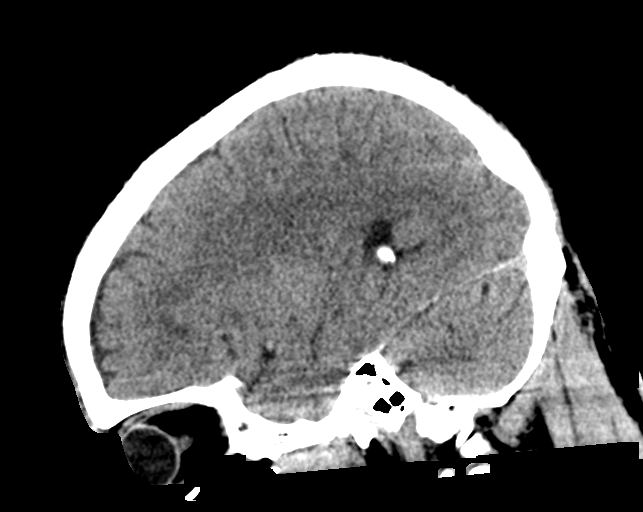

[16 of 47 positions shown; findings below may reference images not displayed]

FINDINGS: Brain: There is a acute/early subacute infarct within the anterior
right MCA territory. No hemorrhage. No midline shift or other mass
effect. No hydrocephalus.

Vascular: No abnormal hyperdensity of the major intracranial
arteries or dural venous sinuses. No intracranial atherosclerosis.

Skull: The visualized skull base, calvarium and extracranial soft
tissues are normal.

Sinuses/Orbits: No fluid levels or advanced mucosal thickening of
the visualized paranasal sinuses. No mastoid or middle ear effusion.
The orbits are normal.
IMPRESSION: Acute/early subacute infarct of the anterior right MCA territory. No
hemorrhage or mass effect.

## 2020-11-28 IMAGING — CT CT ANGIO NECK
2 of 7 series · 8 of 33 positions shown · IV contrast (omnipaque)
Comparison: Plain head CT [0R] hours today. CTA head and neck
[DATE].

CLINICAL DATA: 59-year-old male with slurred speech and subacute
appearing anterior right MCA territory infarct on head CT earlier
today.

EXAM:
CT ANGIOGRAPHY NECK
TECHNIQUE: Multidetector CT imaging of the neck was performed using the
standard protocol during bolus administration of intravenous
contrast. Multiplanar CT image reconstructions and MIPs were
obtained to evaluate the vascular anatomy. Carotid stenosis
measurements (when applicable) are obtained utilizing NASCET
criteria, using the distal internal carotid diameter as the
denominator.
CONTRAST:  50mL OMNIPAQUE IOHEXOL 350 MG/ML SOLN

[Series 4: cta neck · axial · 0.47mm/px · z∈[+1172,+1250]mm · 2 of 119 slices shown]
[im 40/119  soft-tissue]
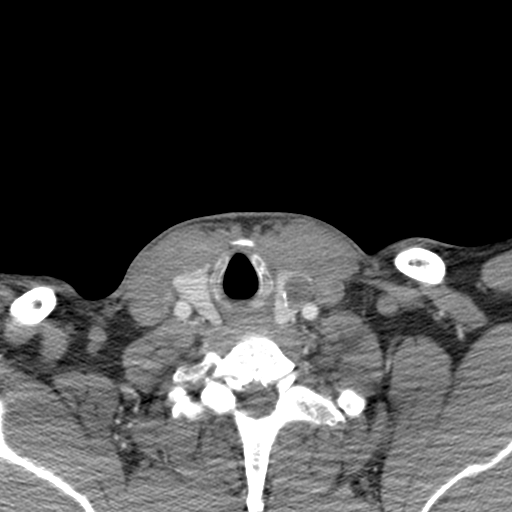
[im 79/119  soft-tissue]
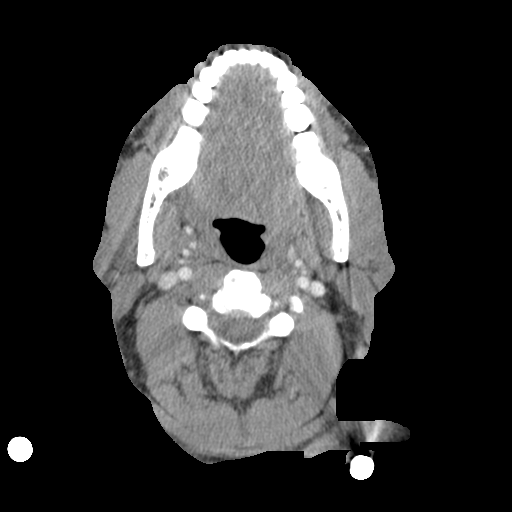

[Series 6: ax thin · axial · 0.39mm/px · z∈[+1128,+1298]mm · 6 of 238 slices shown]
[im 34/238  soft-tissue]
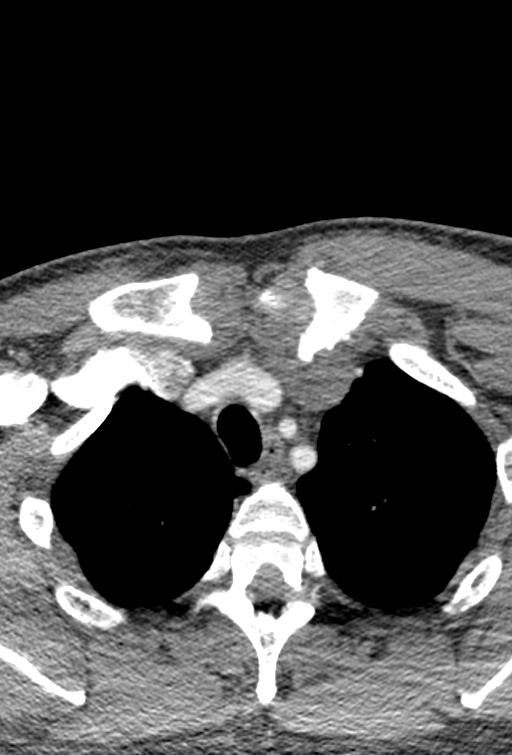
[im 68/238  bone]
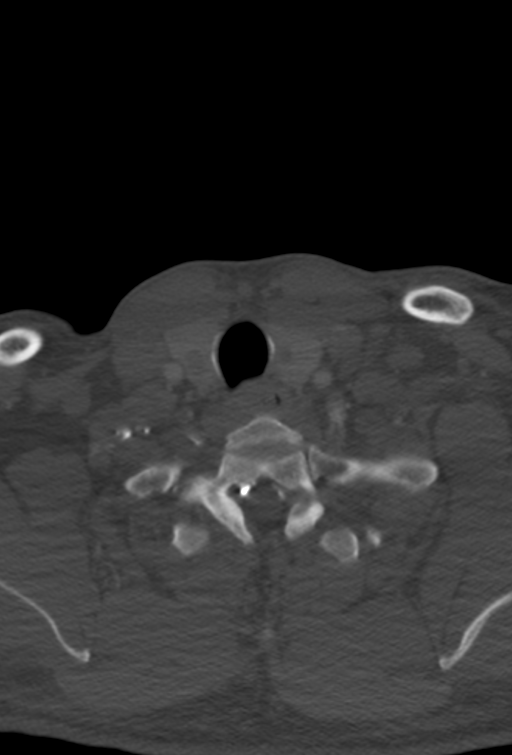
[im 102/238  soft-tissue]
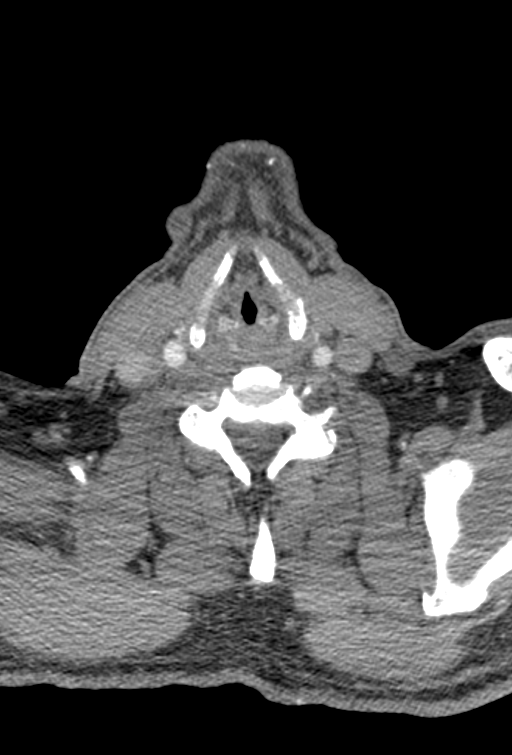
[im 136/238  bone]
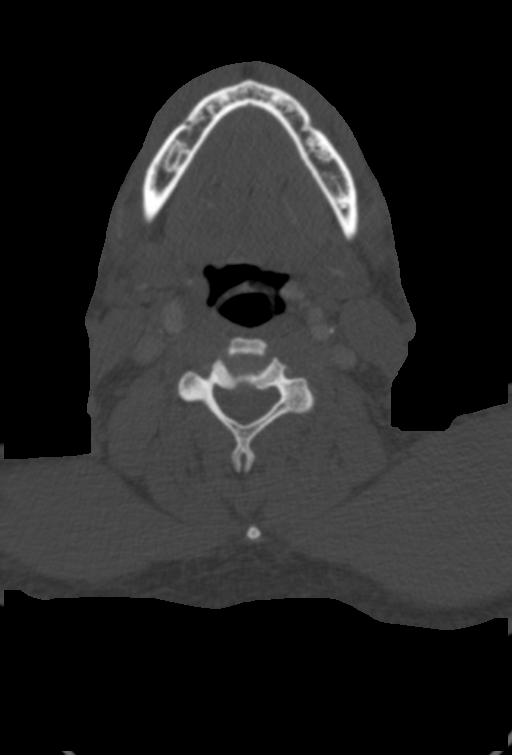
[im 170/238  soft-tissue]
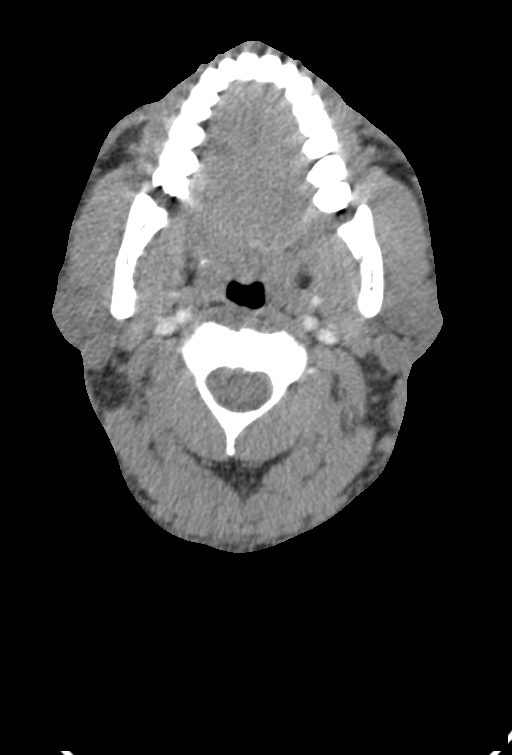
[im 204/238  bone]
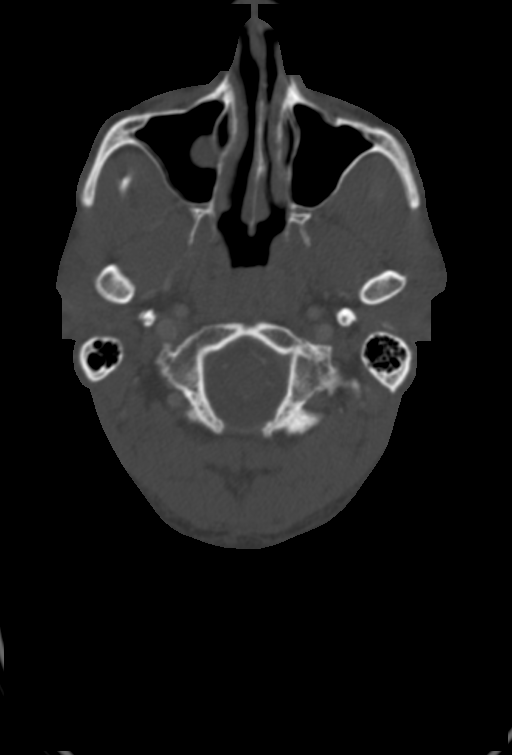

[8 of 33 positions shown; findings below may reference images not displayed]

FINDINGS: Skeleton: C1-C2 cervical spine degeneration. No acute osseous
abnormality identified.

Upper chest: Negative; dilated azygos vein similar to P[employer].

Other neck: Stable thyroid, up to 1.8 cm hypodense nodules as
reported in [REDACTED]. Right submandibular region probable sebaceous
cyst is stable. No acute finding in the neck.

Negative visible posterior fossa.

Aortic arch: 3 vessel arch configuration. No arch atherosclerosis
today.

Right carotid system: Negative right CCA. Mild soft and calcified
plaque at the right ICA origin is stable since [REDACTED] without
stenosis. Visible right ICA siphon is patent to the anterior genu.

Left carotid system: Negative left CCA. Trace calcified plaque at
the left ICA origin is stable without stenosis. Visible left ICA
siphon is patent with some calcified plaque.

Vertebral arteries:
Diminutive vertebral arteries, with fetal type bilateral PCA origins
demonstrated in [REDACTED]. Both cervical vertebral arteries are highly
diminutive but remain patent to the skull base and appears stable
since [REDACTED]. Patent V4 segments, vertebrobasilar junction, left
PICA and dominant appearing right AICA.

Review of the MIP images confirms the above findings
IMPRESSION: 1. Stable cervical carotid and vertebral arteries since [REDACTED] CTA.
No significant stenosis in the neck.

2. Intracranial CTA was not ordered or performed, and I am arranging
for the [REDACTED] MRI dept to add on intracranial MRA at this time to
re-evaluate the Right MCA.

## 2020-11-28 IMAGING — MR MR HEAD W/O CM
11 of 12 series · 32 of 48 positions shown · non-contrast
Comparison: [DATE]

CLINICAL DATA: Abnormal CT with stroke

EXAM:
MRI HEAD WITHOUT CONTRAST
MRA HEAD WITHOUT CONTRAST
TECHNIQUE: Multiplanar, multiecho pulse sequences of the brain and surrounding
structures were obtained without intravenous contrast. Angiographic
images of the head were obtained using MRA technique without
contrast.

[Series 5: DWI · axial · 3.0mm · 0.77mm/px · z∈[-53,+90]mm · 3 of 50 slices shown (1 of 4)]
[im 1/50]
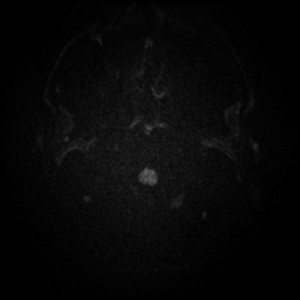
[im 25/50]
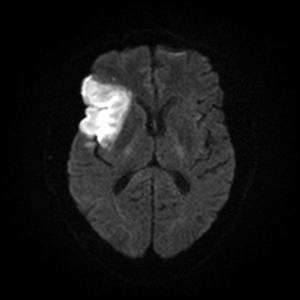
[im 50/50]
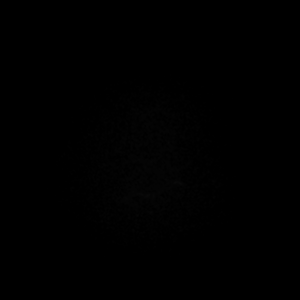

[Series 6: DWI · axial · 3.0mm · 0.77mm/px · z∈[-53,+90]mm · 4 of 50 slices shown (2 of 4)]
[im 1/50]
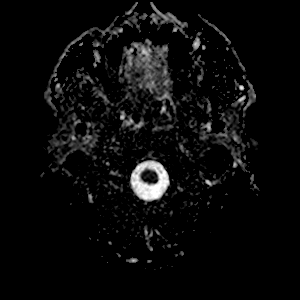
[im 17/50]
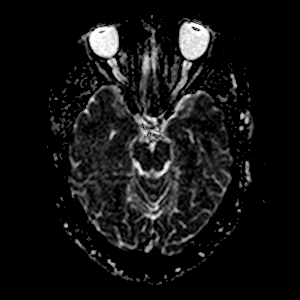
[im 33/50]
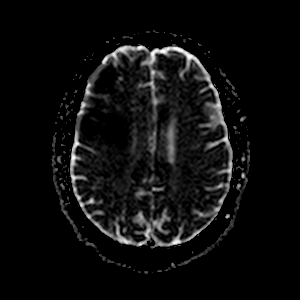
[im 50/50]
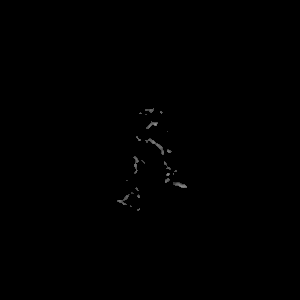

[Series 7: DWI · coronal · 5.0mm · 0.88mm/px · 2 of 28 slices shown (3 of 4)]
[im 1/28]
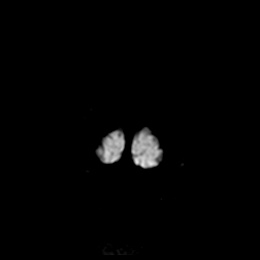
[im 28/28]
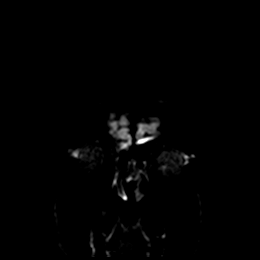

[Series 8: DWI · coronal · 5.0mm · 0.88mm/px · 2 of 28 slices shown (4 of 4)]
[im 1/28]
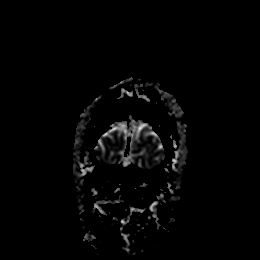
[im 28/28]
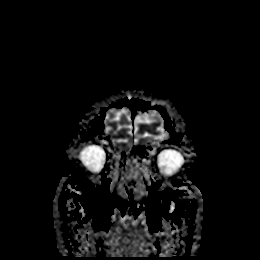

[Series 9: T1 · sagittal · 5.0mm · 0.75mm/px · 1 of 21 slices shown]
[im 1/21]
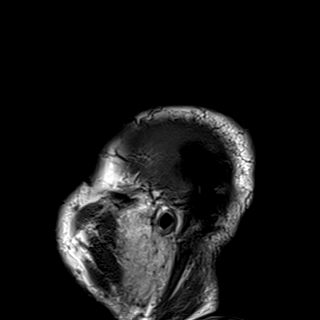

[Series 15: T2 · axial · 5.0mm · 0.72mm/px · z∈[-57,+93]mm · 2 of 23 slices shown (1 of 2)]
[im 1/23]
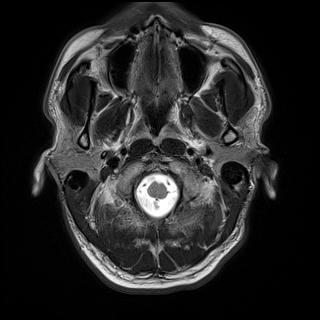
[im 23/23]
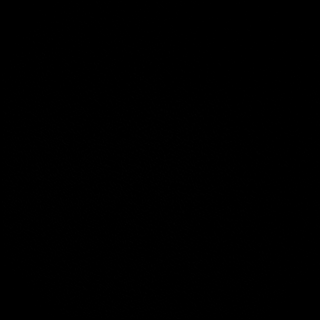

[Series 16: mag_images · axial · 3.0mm · 0.90mm/px · z∈[-64,+108]mm · 4 of 60 slices shown]
[im 1/60]
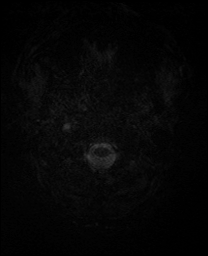
[im 20/60]
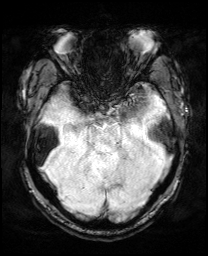
[im 40/60]
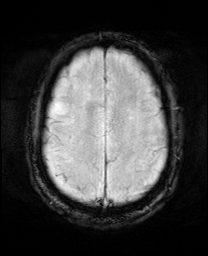
[im 60/60]
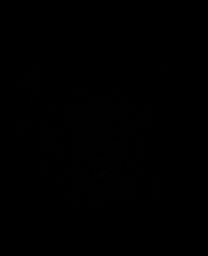

[Series 17: pha_images · axial · 3.0mm · 0.90mm/px · z∈[-64,+94]mm · 4 of 54 slices shown]
[im 1/54]
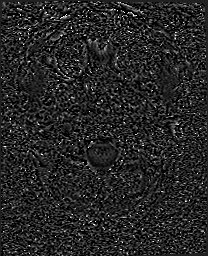
[im 18/54]
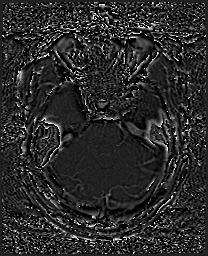
[im 36/54]
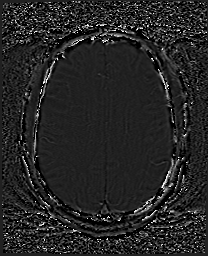
[im 54/54]
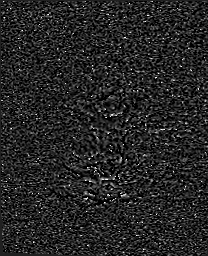

[Series 18: swi_images · axial · 3.0mm · 0.90mm/px · z∈[-64,+105]mm · 4 of 59 slices shown]
[im 1/59]
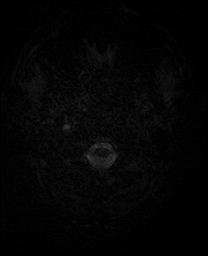
[im 20/59]
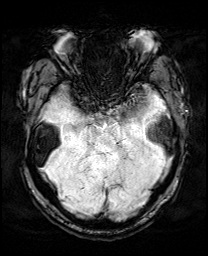
[im 39/59]
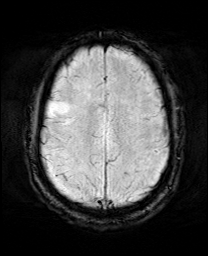
[im 59/59]
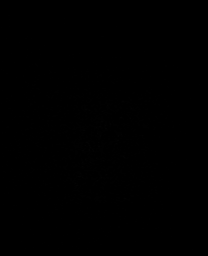

[Series 20: FLAIR · axial · 3.0mm · 0.45mm/px · z∈[-49,+94]mm · 4 of 50 slices shown]
[im 1/50]
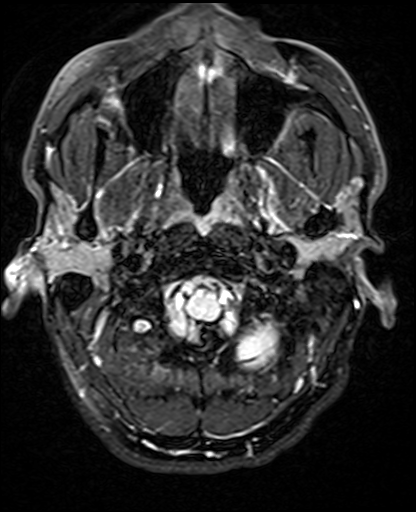
[im 17/50]
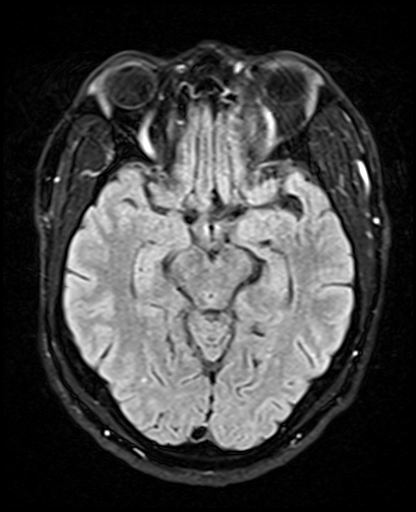
[im 33/50]
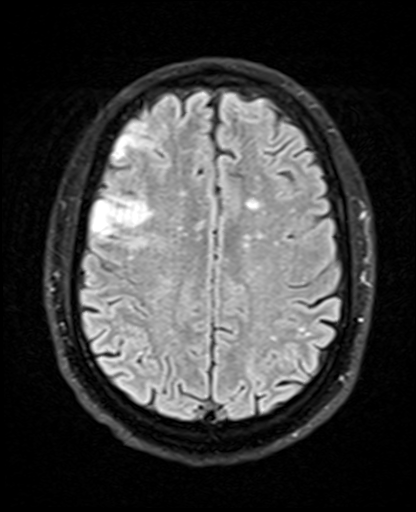
[im 50/50]
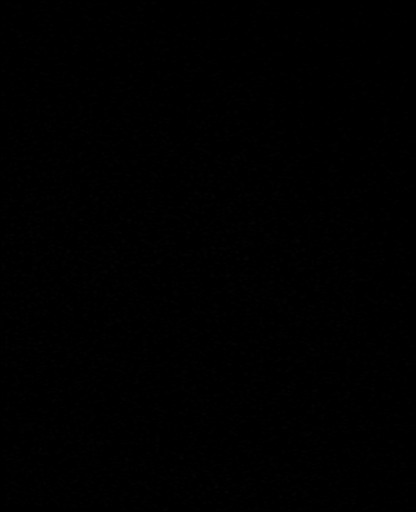

[Series 22: T2 · coronal · 5.0mm · 0.72mm/px · 2 of 28 slices shown (2 of 2)]
[im 1/28]
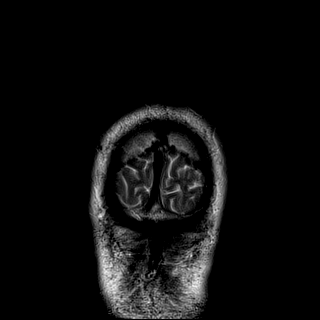
[im 28/28]
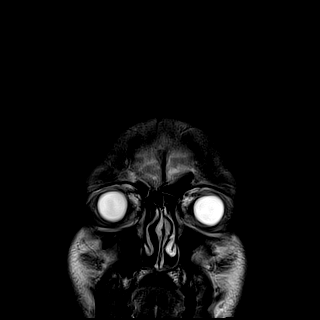

[32 of 48 positions shown; findings below may reference images not displayed]

FINDINGS: MRI HEAD

Brain: Moderate size area of reduced diffusion is present in the
right MCA territory involving the middle and inferior gyri of the
frontal lobe including the operculum. No associated hemorrhage. Mass
effect is minor. Small bilateral acute cerebellar infarcts are also
present.

Small chronic infarcts of the left caudate and bilateral cerebellum.
Additional patchy foci of T2 hyperintensity in the supratentorial
white matter are nonspecific probably reflect mild chronic
microvascular ischemic changes.

There is no intracranial mass. There is no hydrocephalus or
extra-axial fluid collection.

Vascular: Major vessel flow voids at the skull base are preserved.
Diminutive vertebrobasilar flow voids secondary to bilateral fetal
PCAs.

Skull and upper cervical spine: Normal marrow signal is preserved.

Sinuses/Orbits: Paranasal sinuses are aerated. Orbits are
unremarkable.

Other: Sella is unremarkable.  Mastoid air cells are clear.

MRA HEAD

Intracranial internal carotid arteries are patent. Right M1 MCA is
patent. There is occlusion of a right MCA branch just beyond its
origin from the distal M1 segment. Left middle and both anterior
cerebral arteries are patent.

Intracranial vertebral arteries and basilar artery are not well seen
due to small caliber but are patent on prior CTA neck. Posterior
cerebral arteries are patent. Bilateral posterior communicating
arteries are present with fetal origin of the posterior cerebral
arteries.
IMPRESSION: Moderate size acute right MCA territory infarction without
hemorrhagic transformation or significant mass effect.

Small acute infarcts of the cerebellum bilaterally.

Small chronic infarcts and mild chronic microvascular ischemic
changes.

Occlusion of right MCA branch just beyond its origin from the distal
M1 MCA.

## 2020-11-28 MED ORDER — IOHEXOL 350 MG/ML SOLN
50.0000 mL | Freq: Once | INTRAVENOUS | Status: AC | PRN
  Administered 2020-11-28: 09:00:00 50 mL via INTRAVENOUS

## 2020-11-28 MED ORDER — ATORVASTATIN CALCIUM 40 MG PO TABS
40.0000 mg | ORAL_TABLET | Freq: Every day | ORAL | Status: DC
  Administered 2020-11-29 – 2020-12-04 (×6): 40 mg via ORAL
  Filled 2020-11-28 (×7): qty 1

## 2020-11-28 MED ORDER — RIVAROXABAN 20 MG PO TABS
20.0000 mg | ORAL_TABLET | Freq: Every day | ORAL | Status: DC
  Administered 2020-11-28 – 2020-12-02 (×5): 20 mg via ORAL
  Filled 2020-11-28 (×5): qty 1

## 2020-11-28 MED ORDER — RIVAROXABAN 20 MG PO TABS: 20.0000 mg | ORAL_TABLET | Freq: Every day | ORAL | Status: DC

## 2020-11-28 NOTE — Evaluation (Signed)
Occupational Therapy Evaluation Patient Details Name: Matthew Wise MRN: [id number] DOB: 1961/02/03 Today's Date: 11/28/2020    History of Present Illness Matthew Wise is a 59 y.o. male with medical history significant for CVA without residual deficit, hypertension, bilateral lower extremity DVT and PE in setting of hypercoagulable disorder (on Xarelto), CKD 3B who presents to the emergency department due to  2-day onset of slurred speech noted by sister during conversation, facial droop was also noted, he was also noted to be dragging his leg (unfortunately, sister was unable to remember which side of body was affected).  However, patient feels that his speech was at baseline, but due to recent CVA (October 2021), he decided to go to the ED for further evaluation and management. CT of the brain in the emergency department showed acute/early subacute infarcts of his anterior right MCA territory.  Neurology was consulted, and the patient is being transferred to [facility] secondary to no neurology coverage of any pain.   Clinical Impression   Pt marginally agreeable to OT evaluation this am. Evaluation limited by pt lethargy. Pt demonstrating LUE strength deficits, pt reporting continued LUE weakness from prior CVA (10/21). Coordination and sensation appears intact however further assessment required with functional tasks due to pt lethargy this am. Pt reports no rehabilitation after prior CVA. Pt transferring to [facility] for neurology consultation and would benefit from further OT assessment and treatment to determine functional deficits. Recommend outpatient OT on discharge.     Follow Up Recommendations  Outpatient OT    Equipment Recommendations  Other (comment) (TBD)       Precautions / Restrictions Precautions Precautions: Fall Restrictions Weight Bearing Restrictions: No      Mobility Bed Mobility Overal bed mobility: Modified Independent                   Transfers Overall transfer level: Needs assistance Equipment used: None Transfers: Sit to/from [employer] Transfers Sit to Stand: Min guard Stand pivot transfers: Min guard                ADL either performed or assessed with clinical judgement   ADL Overall ADL's : Needs assistance/impaired                                       General ADL Comments: Pt very lethargic and declining all ADL participation this am. Will continue to assess     Vision Baseline Vision/History: No visual deficits Patient Visual Report: No change from baseline Vision Assessment?: No apparent visual deficits            Pertinent Vitals/Pain Pain Assessment: No/denies pain     Hand Dominance Right   Extremity/Trunk Assessment Upper Extremity Assessment Upper Extremity Assessment: LUE deficits/detail LUE Deficits / Details: LUE strength 4/5 throughout. grip strength slightly decreased compared to RUE, however is [facility]. LUE Sensation: WNL LUE Coordination: WNL   Lower Extremity Assessment Lower Extremity Assessment: Defer to PT evaluation   Cervical / Trunk Assessment Cervical / Trunk Assessment: Normal   Communication Communication Communication: No difficulties   Cognition Arousal/Alertness: Lethargic Behavior During Therapy: Flat affect Overall Cognitive Status: Within Functional Limits for tasks assessed  Home Living Family/patient expects to be discharged to:: Private residence Living Arrangements: Alone Available Help at Discharge: Family;Friend(s);Available PRN/intermittently Type of Home: House Home Access: Level entry     Home Layout: One level     Bathroom Shower/Tub: Tub/shower unit         Home Equipment: Cane - single point          Prior Functioning/Environment Level of Independence: Independent        Comments: Pt reports short distance community ambulation  without AD and independent with ADLs        OT Problem List: Decreased strength;Decreased activity tolerance;Impaired balance (sitting and/or standing);Decreased safety awareness      OT Treatment/Interventions: Self-care/ADL training;Therapeutic exercise;Neuromuscular education;DME and/or AE instruction;Therapeutic activities;Patient/family education    OT Goals(Current goals can be found in the care plan section) Acute Rehab OT Goals Patient Stated Goal: Too improve and return home OT Goal Formulation: With patient Time For Goal Achievement: 12/11/20 Potential to Achieve Goals: Good  OT Frequency: Min 2X/week              AM-PAC OT "6 Clicks" Daily Activity     Outcome Measure Help from another person eating meals?: None Help from another person taking care of personal grooming?: None Help from another person toileting, which includes using toliet, bedpan, or urinal?: None Help from another person bathing (including washing, rinsing, drying)?: A Little Help from another person to put on and taking off regular upper body clothing?: None Help from another person to put on and taking off regular lower body clothing?: A Little 6 Click Score: 22   End of Session Equipment Utilized During Treatment: Gait belt  Activity Tolerance: Patient limited by lethargy Patient left: in bed;with call bell/phone within reach  OT Visit Diagnosis: Muscle weakness (generalized) (M62.81);Hemiplegia and hemiparesis Hemiplegia - Right/Left: Left Hemiplegia - dominant/non-dominant: Non-Dominant Hemiplegia - caused by: Cerebral infarction                Time: 6503-5465 OT Time Calculation (min): 12 min Charges:  OT General Charges $OT Visit: 1 Visit OT Evaluation $OT Eval Low Complexity: 1 Low  Ezra Sites, OTR/L  [phone] 11/28/2020, 8:28 AM

## 2020-11-28 NOTE — Progress Notes (Signed)
SLP Cancellation Note  Patient Details Name: Matthew Wise MRN: [id number] DOB: Oct 04, 1961   Cancelled treatment:       Reason Eval/Treat Not Completed: SLP screened, no needs identified, will sign off; BSE ordered, however Pt passed the [place] swallow screen and was started on a regular diet. SLP screened Pt in room and he indicates that he had no trouble consuming his lunch meal. He denies changes in thinking and memory, no word finding deficits noted, slight dysarthria (Pt with mild left facial asymmetry). Pt was encouraged to use over articulation strategies to increase speech intelligibility. Can complete SLE if MD desires, however Pt and son report that only change is slight slurring of speech with improvement noted today. SLP will sign off.   Thank you,  Havery Moros, CCC-SLP [phone]    Draken Farrior 11/28/2020, 3:46 PM

## 2020-11-28 NOTE — ED Notes (Signed)
Pt to CT

## 2020-11-28 NOTE — Evaluation (Signed)
Physical Therapy Evaluation Patient Details Name: Matthew Wise MRN: [id number] DOB: August 17, 1961 Today's Date: 11/28/2020   History of Present Illness  FORNEY Matthew Wise is a 59 y.o. male with medical history significant for CVA without residual deficit, hypertension, bilateral lower extremity DVT and PE in setting of hypercoagulable disorder (on Xarelto), CKD 3B who presents to the emergency department due to  2-day onset of slurred speech noted by sister during conversation, facial droop was also noted, he was also noted to be dragging his leg (unfortunately, sister was unable to remember which side of body was affected).  However, patient feels that his speech was at baseline, but due to recent CVA (October 2021), he decided to go to the ED for further evaluation and management. CT of the brain in the emergency department showed acute/early subacute infarcts of his anterior right MCA territory.  Neurology was consulted, and the patient is being transferred to [facility] secondary to no neurology coverage of any pain.    Clinical Impression  Patient functioning near baseline for functional mobility and gait.  Patient demonstrates good return for ambulation on level, inclined, declined surfaces and stairs without loss of balance despite frequent dragging of left foot due to limited left ankle dorsiflexion when walking.  Patient able to dorsiflex left ankle enough to keep toes from dragging when prompted during gait training with fair/good return for heel striking, but will benefit from continue PT in an out patient or home health setting to work on this (patient states he did not receive physical therapy after last stroke).  Plan:  Patient discharged from physical therapy to care of nursing for ambulation daily as tolerated for length of stay.     Follow Up Recommendations Home health PT;Supervision - Intermittent    Equipment Recommendations  None recommended by PT    Recommendations for Other  Services       Precautions / Restrictions Precautions Precautions: None Restrictions Weight Bearing Restrictions: No      Mobility  Bed Mobility Overal bed mobility: Modified Independent                  Transfers Overall transfer level: Modified independent               General transfer comment: demonstrates good return for sit to stands and transferring to chair without loss of balance  Ambulation/Gait Ambulation/Gait assistance: Modified independent (Device/Increase time) Gait Distance (Feet): 200 Feet Assistive device: None Gait Pattern/deviations: Decreased step length - left;Decreased dorsiflexion - left;WFL(Within Functional Limits) Gait velocity: decreased   General Gait Details: grossly WFL except tends to drag left foot when taking steps without loss of balance, demonstrates good return for ambulation on level, inclined and declined surfaces without loss of balance  Stairs Stairs: Yes Stairs assistance: Modified independent (Device/Increase time) Stair Management: One rail Right;Two rails;Alternating pattern Number of Stairs: 10 General stair comments: demonstrates good return for going up stairs using 1 siderail with alternating pattern, used 2 siderails with alternating when coming down stairs without loss of balance  Wheelchair Mobility    Modified Rankin (Stroke Patients Only)       Balance Overall balance assessment: Mild deficits observed, not formally tested                                           Pertinent Vitals/Pain Pain Assessment: No/denies pain  Home Living Family/patient expects to be discharged to:: Private residence Living Arrangements: Alone Available Help at Discharge: Family;Friend(s);Available PRN/intermittently Type of Home: House Home Access: Stairs to enter Entrance Stairs-Rails: Right Entrance Stairs-Number of Steps: 2 Home Layout: One level Home Equipment: Walker - 2 wheels;Cane - single  point      Prior Function Level of Independence: Independent         Comments: Pt reports short distance community ambulation without AD and independent with ADLs     Hand Dominance   Dominant Hand: Right    Extremity/Trunk Assessment   Upper Extremity Assessment Upper Extremity Assessment: Defer to OT evaluation    Lower Extremity Assessment Lower Extremity Assessment: Overall WFL for tasks assessed;LLE deficits/detail LLE Deficits / Details: grossly -4/5 except left ankle dorsiflexion 3+/5 (baseline since previous CVA per patient) LLE Sensation: WNL LLE Coordination: decreased fine motor (limited motor control of left ankle dorsiflexion when walking)    Cervical / Trunk Assessment Cervical / Trunk Assessment: Normal  Communication   Communication: No difficulties  Cognition Arousal/Alertness: Awake/alert Behavior During Therapy: WFL for tasks assessed/performed Overall Cognitive Status: Within Functional Limits for tasks assessed                                        General Comments      Exercises     Assessment/Plan    PT Assessment All further PT needs can be met in the next venue of care  PT Problem List Decreased strength;Decreased coordination (left ankle dorsiflexion)       PT Treatment Interventions      PT Goals (Current goals can be found in the Care Plan section)  Acute Rehab PT Goals Patient Stated Goal: return home with family to assist PT Goal Formulation: With patient Time For Goal Achievement: 11/28/20 Potential to Achieve Goals: Good    Frequency     Barriers to discharge        Co-evaluation               AM-PAC PT "6 Clicks" Mobility  Outcome Measure Help needed turning from your back to your side while in a flat bed without using bedrails?: None Help needed moving from lying on your back to sitting on the side of a flat bed without using bedrails?: None Help needed moving to and from a bed to a chair  (including a wheelchair)?: None Help needed standing up from a chair using your arms (e.g., wheelchair or bedside chair)?: None Help needed to walk in hospital room?: None Help needed climbing 3-5 steps with a railing? : None 6 Click Score: 24    End of Session   Activity Tolerance: Patient tolerated treatment well Patient left: in chair;with call bell/phone within reach Nurse Communication: Mobility status PT Visit Diagnosis: Unsteadiness on feet (R26.81);Other abnormalities of gait and mobility (R26.89);Muscle weakness (generalized) (M62.81)    Time: 7026-3785 PT Time Calculation (min) (ACUTE ONLY): 23 min   Charges:   PT Evaluation $PT Eval Moderate Complexity: 1 Mod PT Treatments $Therapeutic Activity: 23-37 mins        3:31 PM, 11/28/20 Lonell Grandchild, MPT Physical Therapist with [facility] [phone] office [phone] mobile phone

## 2020-11-28 NOTE — ED Notes (Addendum)
ED Provider at bedside. 

## 2020-11-28 NOTE — Plan of Care (Signed)
°  Problem: Education: Goal: Knowledge of disease or condition will improve Outcome: Progressing Goal: Knowledge of secondary prevention will improve Outcome: Progressing Goal: Knowledge of patient specific risk factors addressed and post discharge goals established will improve Outcome: Progressing   Problem: Coping: Goal: Will verbalize positive feelings about self Outcome: Progressing   Problem: Self-Care: Goal: Ability to participate in self-care as condition permits will improve Outcome: Progressing Goal: Ability to communicate needs accurately will improve Outcome: Progressing   Problem: Nutrition: Goal: Risk of aspiration will decrease Outcome: Progressing   Problem: Ischemic Stroke/TIA Tissue Perfusion: Goal: Complications of ischemic stroke/TIA will be minimized Outcome: Progressing   Problem: Education: Goal: Knowledge of General Education information will improve Description: Including pain rating scale, medication(s)/side effects and non-pharmacologic comfort measures Outcome: Progressing   Problem: Health Behavior/Discharge Planning: Goal: Ability to manage health-related needs will improve Outcome: Progressing   Problem: Clinical Measurements: Goal: Ability to maintain clinical measurements within normal limits will improve Outcome: Progressing Goal: Will remain free from infection Outcome: Progressing Goal: Diagnostic test results will improve Outcome: Progressing Goal: Respiratory complications will improve Outcome: Progressing Goal: Cardiovascular complication will be avoided Outcome: Progressing   Problem: Activity: Goal: Risk for activity intolerance will decrease Outcome: Progressing   Problem: Nutrition: Goal: Adequate nutrition will be maintained Outcome: Progressing   Problem: Coping: Goal: Level of anxiety will decrease Outcome: Progressing   Problem: Elimination: Goal: Will not experience complications related to bowel  motility Outcome: Progressing Goal: Will not experience complications related to urinary retention Outcome: Progressing   Problem: Pain Managment: Goal: General experience of comfort will improve Outcome: Progressing   Problem: Safety: Goal: Ability to remain free from injury will improve Outcome: Progressing   Problem: Skin Integrity: Goal: Risk for impaired skin integrity will decrease Outcome: Progressing   

## 2020-11-28 NOTE — Progress Notes (Signed)
*  PRELIMINARY RESULTS* Echocardiogram 2D Echocardiogram has been performed.  Jeryl Columbia 11/28/2020, 11:12 AM

## 2020-11-28 NOTE — Plan of Care (Signed)
  Problem: Acute Rehab OT Goals (only OT should resolve) Goal: Pt. Will Perform Eating Flowsheets (Taken 11/28/2020 0834) Pt Will Perform Eating:  with modified independence  sitting Goal: Pt. Will Perform Grooming Flowsheets (Taken 11/28/2020 0834) Pt Will Perform Grooming:  with modified independence  standing Goal: Pt. Will Perform Upper Body Dressing Flowsheets (Taken 11/28/2020 0834) Pt Will Perform Upper Body Dressing:  with modified independence  sitting  standing Goal: Pt. Will Perform Lower Body Dressing Flowsheets (Taken 11/28/2020 0834) Pt Will Perform Lower Body Dressing:  with modified independence  sitting/lateral leans  sit to/from stand Goal: Pt. Will Transfer To Toilet Flowsheets (Taken 11/28/2020 [phone]) Pt Will Transfer to Toilet:  with modified independence  ambulating  regular height toilet Goal: Pt. Will Perform Toileting-Clothing Manipulation Flowsheets (Taken 11/28/2020 0834) Pt Will Perform Toileting - Clothing Manipulation and hygiene:  with modified independence  sitting/lateral leans  sit to/from stand Goal: Pt/Caregiver Will Perform Home Exercise Program Flowsheets (Taken 11/28/2020 [phone]) Pt/caregiver will Perform Home Exercise Program:  Increased strength  Left upper extremity  Independently  With written HEP provided

## 2020-11-28 NOTE — ED Notes (Signed)
EKG done due to patient's recent history of CVA

## 2020-11-28 NOTE — Progress Notes (Signed)
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  This was initially noted on 11/25/2020.  She subsequently saw the patient in person on 11/27/2020 and noted the patient to continue to have slurred speech.  Apparently she noted some leg weakness and drooping on his face.  Unfortunately, she did not remember which side.  She stated that the patient's voice was "dragging".  The patient himself felt that his speech was normal.  He denied any new focal extremity weakness, visual disturbance, dysphasia, dysarthria, headache.  The patient endorses compliance with his rivaroxaban and Lipitor. Notably, the patient was admitted to the hospital from 09/07/2020 to 09/11/2020 during which time he had an acute embolic stroke.  MRI of the brain at that time showed multiple supratentorial and infratentorial acute and subacute infarcts in multiple vascular territories.  It was felt that this was secondary to poor compliance with his anticoagulation.  He had been off of his anticoagulation for about 4 months at that time.  During that hospitalization, the patient was restarted on his rivaroxaban.  He was seen by hematology who felt that his hypercoagulable state was secondary to his congenital interruption of his IVC and that further testing was not required at that time. As stated above, the patient now states that he has been compliant with his anticoagulation without missing any doses.  CT of the brain in the emergency department showed acute/early subacute infarcts of his anterior right MCA territory.  Neurology was consulted, and the patient is  being transferred to [facility] secondary to no neurology coverage of any pain.  Assessment/Plan: Acute ischemic stroke --Neurology Consult-->transfer to [facility] -PT/OT evaluation -Speech therapy eval -CT brain--acute/early subacute infarct of the anterior right MCA territory -MRI brain-- -MRA brain-- -09/07/20 CTA H&N--no LVO, no hemodynamically significant stenosis of the carotids -11/28/2020 CTA Neck--pending -09/10/20 TEE--No LA, No LAA thrombus; Bubble study neg -LDL-- -09/08/20 HbA1C--5.3 -Antiplatelet/AC--ASA-81 mg  CKD stage 3b -baseline creatinine 1.5-1.7  Hyperlipidemia -Continue statin -09/08/2020 LDL 83  Essential hypertension -Holding amlodipine to allow for permissive hypertension  Hypercoagulable State -continue Xarelto  Tobacco Abuse -cessation discussed -35+ pack year history    Status is: Observation  The patient will require care spanning > 2 midnights and should be moved to inpatient because: Ongoing diagnostic testing needed not appropriate for outpatient work up  Dispo: The patient is from: Home              Anticipated d/c is to: Home              Anticipated d/c date is: 2 days              Patient currently is not medically stable to d/c.       Total time spent 35 minutes.  Greater than 50% spent face to face counseling and coordinating care.    Family Communication:  no Family at bedside  Consultants:  neurology  Code Status:  FULL  DVT Prophylaxis:  Xarelto   Procedures: As Listed in Progress Note Above  Antibiotics: None       Subjective: Patient denies fevers, chills, headache, chest  pain, dyspnea, nausea, vomiting, diarrhea, abdominal pain, dysuria, hematuria, hematochezia, and melena. Denies focal extremity weakness, headache, dysarthria, visual disturbance  Objective: Vitals:   11/28/20 0300 11/28/20 0415 11/28/20 0540 11/28/20 0607  BP: (!) 139/96 140/81 129/73   Pulse: 62  (!) 56   Resp: 17 17 18    Temp:    98.6 F (37 C)   TempSrc:   Oral   SpO2: 98% 100% 92%   Weight:    119.3 kg  Height:    6' (1.829 m)   No intake or output data in the 24 hours ending 11/28/20 0732 Weight change:  Exam:   General:  Pt is alert, follows commands appropriately, not in acute distress  HEENT: No icterus, No thrush, No neck mass, [place]/AT  Cardiovascular: RRR, S1/S2, no rubs, no gallops  Respiratory: bilateral scattered rales. No wheeze  Abdomen: Soft/+BS, non tender, non distended, no guarding  Extremities: No edema, No lymphangitis, No petechiae, No rashes, no synovitis  Neuro:  CN II-XII intact, strength 4/5 in RUE, RLE, strength 4/5 LUE, LLE; sensation intact bilateral; no dysmetria; babinski equivocal     Data Reviewed: I have personally reviewed following labs and imaging studies Basic Metabolic Panel: Recent Labs  Lab 11/28/20 0145  NA 136  K 3.8  CL 101  CO2 27  GLUCOSE 104*  BUN 16  CREATININE 1.76*  CALCIUM 9.5  MG 2.3  PHOS 3.3   Liver Function Tests: Recent Labs  Lab 11/28/20 0145  AST 24  ALT 25  ALKPHOS 79  BILITOT 1.1  PROT 8.0  ALBUMIN 4.4   No results for input(s): LIPASE, AMYLASE in the last 168 hours. No results for input(s): AMMONIA in the last 168 hours. Coagulation Profile: Recent Labs  Lab 11/28/20 0145  INR 1.9*   CBC: Recent Labs  Lab 11/28/20 0145  WBC 6.2  NEUTROABS 3.6  HGB 14.0  HCT 41.3  MCV 87.5  PLT 194   Cardiac Enzymes: No results for input(s): CKTOTAL, CKMB, CKMBINDEX, TROPONINI in the last 168 hours. BNP: Invalid input(s): POCBNP CBG: No results for input(s): GLUCAP in the last 168 hours. HbA1C: No results for input(s): HGBA1C in the last 72 hours. Urine analysis:    Component Value Date/Time   COLORURINE YELLOW 09/07/2020 1720   APPEARANCEUR CLEAR 09/07/2020 1720   LABSPEC 1.011 09/07/2020 1720   PHURINE 5.0 09/07/2020 1720   GLUCOSEU NEGATIVE 09/07/2020 1720   HGBUR NEGATIVE 09/07/2020 1720   BILIRUBINUR  NEGATIVE 09/07/2020 1720   KETONESUR NEGATIVE 09/07/2020 1720   PROTEINUR NEGATIVE 09/07/2020 1720   UROBILINOGEN >8.0 (H) 02/03/2015 1854   NITRITE NEGATIVE 09/07/2020 1720   LEUKOCYTESUR NEGATIVE 09/07/2020 1720   Sepsis Labs: @LABRCNTIP (procalcitonin:4,lacticidven:4) )No results found for this or any previous visit (from the past 240 hour(s)).   Scheduled Meds: . atorvastatin  40 mg Oral Daily   Continuous Infusions:  Procedures/Studies: CT HEAD WO CONTRAST  Result Date: 11/28/2020 CLINICAL DATA:  Slurred speech EXAM: CT HEAD WITHOUT CONTRAST TECHNIQUE: Contiguous axial images were obtained from the base of the skull through the vertex without intravenous contrast. COMPARISON:  None. FINDINGS: Brain: There is a acute/early subacute infarct within the anterior right MCA territory. No hemorrhage. No midline shift or other mass effect. No hydrocephalus. Vascular: No abnormal hyperdensity of the major intracranial arteries or dural venous sinuses. No intracranial atherosclerosis. Skull: The visualized skull base, calvarium and extracranial soft tissues are normal. Sinuses/Orbits: No fluid levels or advanced mucosal thickening of the visualized paranasal sinuses.  No mastoid or middle ear effusion. The orbits are normal. IMPRESSION: Acute/early subacute infarct of the anterior right MCA territory. No hemorrhage or mass effect. Electronically Signed   By: Ulyses Jarred M.D.   On: 11/28/2020 03:16    Orson Eva, DO  [facility]  If 7PM-7AM, please contact night-coverage [email or web address] Password [email or web address] 11/28/2020, 7:32 AM   LOS: 0 days

## 2020-11-28 NOTE — ED Provider Notes (Signed)
[facility] EMERGENCY DEPARTMENT Provider Note   CSN: [id number] Arrival date & time: 11/27/20  2123     History Chief Complaint  Patient presents with   Aphasia    Matthew Wise is a 59 y.o. male.  The history is provided by the patient.  Weakness Severity:  Moderate Onset quality:  Gradual Timing:  Intermittent Progression:  Improving Chronicity:  New Relieved by:  None tried Worsened by:  Nothing Associated symptoms: no chest pain, no fever, no headaches, no shortness of breath, no vision change and no vomiting   Patient with previous history of stroke, chronic kidney disease, chronic anticoagulation presents with concerns for stroke.  It is reported that patient's family members noted facial droop and weakness.  Patient denies any new complaints.  He denies any pain complaints.  He feels that his speech is at baseline.  He denies any weakness.       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   Prediabetes 10/24/2019    Patient Active Problem List   Diagnosis Date Noted   Acute CVA (cerebrovascular accident) ([place]) 09/09/2020   Transient ischemic attack 09/07/2020   History of hypertension 09/07/2020   Elevated troponin 09/07/2020   Dizziness 09/07/2020   Numbness and tingling 09/07/2020   H/O medication noncompliance 09/07/2020   Medicare annual wellness visit,  subsequent 12/05/2019   Nicotine dependence 12/05/2019   CKD (chronic kidney disease) stage 3, GFR 30-59 ml/min (HCC) 10/24/2019   Prediabetes 10/24/2019   Pedal edema 09/14/2017   Tubular adenoma of colon 09/14/2017   Chronic anticoagulation 11/14/2014   Sickle cell trait ([place]) 08/29/2012   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   DVT (deep venous thrombosis) ([place]) 06/15/2011   Pulmonary embolism ([place]) 06/15/2011   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    Past Surgical History:  Procedure Laterality Date   COLONOSCOPY WITH PROPOFOL N/A 10/29/2015   Procedure: COLONOSCOPY WITH PROPOFOL;  Surgeon: Danie Binder, MD;  Location: [facility] ENDO SUITE;  Service: Endoscopy;  Laterality: N/A;  930   None to date  10/07/15   TEE WITHOUT CARDIOVERSION N/A 09/10/2020   Procedure: TRANSESOPHAGEAL ECHOCARDIOGRAM (TEE) WITH PROPOFOL;  Surgeon: Satira Sark, MD;  Location: [facility] ORS;  Service: Cardiovascular;  Laterality: N/A;       Family History  Problem Relation Age of Onset   COPD Father    Stroke Brother    Pulmonary embolism Sister    Throat cancer Brother    Arthritis Mother    Diabetes Mother    Stroke Mother    Hypertension Brother    Diabetes Brother    Arthritis Brother    Diabetes Sister    Colon cancer Neg Hx     Social History   Tobacco Use   Smoking status: Current Every Day Smoker    Packs/day: 1.00  Years: 30.00    Pack years: 30.00    Types: Cigarettes    Start date: 12/01/1983   Smokeless tobacco: Never Used   Tobacco comment: counseled  Vaping Use   Vaping Use: Never used  Substance Use Topics   Alcohol use: Yes    Alcohol/week: 3.0 standard drinks    Types: 3 Cans of beer per week    Comment: 2-3 beers a day   Drug use: Not Currently    Comment: REMOTE HISTORY, Quit in 2007    Home Medications Prior to Admission medications   Medication Sig Start Date End  Date Taking? Authorizing Provider  amLODipine (NORVASC) 5 MG tablet Take 1 tablet (5 mg total) by mouth daily. 11/20/20   Elenore Paddy, NP  atorvastatin (LIPITOR) 40 MG tablet Take 1 tablet (40 mg total) by mouth daily. 11/20/20   Elenore Paddy, NP  fexofenadine (ALLEGRA) 180 MG tablet Take 180 mg by mouth every other day.    [provider]  fluticasone (FLONASE) 50 MCG/ACT nasal spray Place into both nostrils daily.    [provider]  rivaroxaban (XARELTO) 20 MG TABS tablet Take 1 tablet (20 mg total) by mouth daily with supper. 11/20/20   Elenore Paddy, NP    Allergies    Patient has no known allergies.  Review of Systems   Review of Systems  Constitutional: Negative for fever.  Eyes: Negative for visual disturbance.  Respiratory: Negative for shortness of breath.   Cardiovascular: Negative for chest pain.  Gastrointestinal: Negative for vomiting.  Neurological: Positive for weakness. Negative for headaches.  All other systems reviewed and are negative.   Physical Exam Updated Vital Signs BP (!) 143/87    Pulse (!) 56    Temp 98.7 F (37.1 C) (Oral)    Resp 18    SpO2 100%   Physical Exam CONSTITUTIONAL: Well developed/well nourished, sleeping on initial exam HEAD: Normocephalic/atraumatic EYES: EOMI/PERRL, no nystagmus,  no ptosis ENMT: Mucous membranes moist NECK: supple no meningeal signs CV: S1/S2 noted LUNGS: Lungs are clear to auscultation bilaterally, no apparent distress ABDOMEN: soft, nontender, no rebound or guarding GU:no cva tenderness NEURO:Awake/alert, ?mild left facial droop, no arm  drift is noted Equal 5/5 strength with shoulder abduction, elbow flex/extension, wrist flex/extension in upper extremities  Mild drift in right lower extremity. Cranial nerves 3/4/5/05/08/09/11/12 tested and intact Sensation to light touch intact in all extremities EXTREMITIES: pulses normal, full ROM SKIN: warm, color normal PSYCH: Unable to fully  assess  ED Results / Procedures / Treatments   Labs (all labs ordered are listed, but only abnormal results are displayed) Labs Reviewed  PROTIME-INR - Abnormal; Notable for the following components:      Result Value   Prothrombin Time 21.5 (*)    INR 1.9 (*)    All other components within normal limits  APTT - Abnormal; Notable for the following components:   aPTT 39 (*)    All other components within normal limits  COMPREHENSIVE METABOLIC PANEL - Abnormal; Notable for the following components:   Glucose, Bld 104 (*)    Creatinine, Ser 1.76 (*)    GFR, Estimated 44 (*)    All other components within normal limits  SARS CORONAVIRUS 2 (TAT 6-24 HRS)  ETHANOL  CBC  DIFFERENTIAL  RAPID URINE DRUG SCREEN, HOSP PERFORMED  URINALYSIS, ROUTINE W REFLEX MICROSCOPIC    EKG EKG Interpretation  Date/Time:  Thursday November 28 2020 00:37:32 EST Ventricular Rate:  50 PR  Interval:    QRS Duration: 89 QT Interval:  496 QTC Calculation: 453 R Axis:   50 Text Interpretation: Sinus rhythm Borderline T abnormalities, inferior leads Confirmed by Ripley Fraise [phone]) on 11/28/2020 12:51:49 AM   Radiology CT HEAD WO CONTRAST  Result Date: 11/28/2020 CLINICAL DATA:  Slurred speech EXAM: CT HEAD WITHOUT CONTRAST TECHNIQUE: Contiguous axial images were obtained from the base of the skull through the vertex without intravenous contrast. COMPARISON:  None. FINDINGS: Brain: There is a acute/early subacute infarct within the anterior right MCA territory. No hemorrhage. No midline shift or other mass effect. No hydrocephalus. Vascular: No abnormal hyperdensity of the major intracranial arteries or dural venous sinuses. No intracranial atherosclerosis. Skull: The visualized skull base, calvarium and extracranial soft tissues are normal. Sinuses/Orbits: No fluid levels or advanced mucosal thickening of the visualized paranasal sinuses. No mastoid or middle ear effusion. The orbits are normal.  IMPRESSION: Acute/early subacute infarct of the anterior right MCA territory. No hemorrhage or mass effect. Electronically Signed   By: Ulyses Jarred M.D.   On: 11/28/2020 03:16    Procedures Procedures   Medications Ordered in ED Medications - No data to display  ED Course  I have reviewed the triage vital signs and the nursing notes.  Pertinent labs & imaging results that were available during my care of the patient were reviewed by me and considered in my medical decision making (see chart for details).    MDM Rules/Calculators/A&P                          2:16 AM Patient presents for concerns for stroke.  I spoke to his niece Bethena Roys She reports over the past 2 days she has spoken to him on the phone and his voice was "dragging " She also reports when she saw him he had a facial droop and was dragging the leg, but she cannot recall which side of the body was affected Patient is high risk for recurrent stroke and has a hypercoagulable disorder on anticoagulation and previous had a stroke due to embolic event however with left-sided deficits at that time. Imaging/labs are pending at this time.  tPA in stroke considered but not given due to: Onset over 3-4.5hours 3:30 AM CT head revealed stroke The symptoms started at least 48 hours ago. Patient is already on anticoagulation. Patient will be admitted for further stroke evaluation. 4:15 AM D/w dr Josephine Cables for admission to hospitalist service   Final Clinical Impression(s) / ED Diagnoses Final diagnoses:  Cerebrovascular accident (CVA), unspecified mechanism [facility])    Rx / [place] Orders ED Discharge Orders    None       Ripley Fraise, MD 11/28/20 [phone]

## 2020-11-28 NOTE — H&P (Signed)
History and Physical  Matthew Wise [id number] DOB: 1961-01-10 DOA: 11/28/2020  Referring physician: Ripley Fraise, MD PCP: Doree Albee, MD  Patient coming from: Home  Chief Complaint: Slurred speech  HPI: Matthew Wise is a 59 y.o. male with medical history significant for CVA without residual deficit, hypertension, bilateral lower extremity DVT and PE in setting of hypercoagulable disorder (on Xarelto), CKD 3B who presents to the emergency department due to  2-day onset of slurred speech noted by sister during conversation, facial droop was also noted, he was also noted to be dragging his leg (unfortunately, sister was unable to remember which side of body was affected).  However, patient feels that his speech was at baseline, but due to recent CVA (October 2021), he decided to go to the ED for further evaluation and management.  He denies chest pain, shortness of breath, fever, chills, sick contact Patient was recently admitted from 10/9-10/13 due to multiple small infarct without weakness which was thought to be due to noncompliance with Xarelto   ED Course: In the emergency department, he was bradycardic, but other vital signs are within normal range.  Work-up in the ED showed normal CBC, creatinine 1.76 (baseline creatinine 1.5-1.7). CT of head without contrast showed acute/early subacute infarct of the anterior right MCA territory. No hemorrhage or mass effect. Hospitalist was asked to admit patient for further evaluation and management.  Review of Systems: Constitutional: Negative for chills and fever.  HENT: Negative for ear pain and sore throat.   Eyes: Negative for pain and visual disturbance.  Respiratory: Negative for cough, chest tightness and shortness of breath.   Cardiovascular: Negative for chest pain and palpitations.  Gastrointestinal: Negative for abdominal pain and vomiting.  Endocrine: Negative for polyphagia and polyuria.  Genitourinary: Negative  for decreased urine volume, dysuria, enuresis Musculoskeletal: Negative for arthralgias and back pain.  Skin: Negative for color change and rash.  Allergic/Immunologic: Negative for immunocompromised state.  Neurological: Positive for weakness.  Negative for tremors, syncope, speech difficulty Hematological: Does not bruise/bleed easily.  All other systems reviewed and are negative    Past Medical History:  Diagnosis Date  . Acute CVA (cerebrovascular accident) ([place]) 09/09/2020  . Chronic anticoagulation 11/14/2014  . CKD (chronic kidney disease) stage 3, GFR 30-59 ml/min (HCC) 10/24/2019  . CKD (chronic kidney disease) stage 3, GFR 30-59 ml/min (HCC) 10/24/2019  . Clotting disorder ([place])   . 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 DVT (deep venous thrombosis) ([place]) 06/15/2011  . DVT of leg (deep venous thrombosis) (HCC)    rt. leg  . H/O PE (pulmonary embolism) 06/15/2011  . Hyperlipidemia 11/28/2020  . 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 Prediabetes 10/24/2019   Past Surgical History:  Procedure Laterality Date  . COLONOSCOPY WITH PROPOFOL N/A 10/29/2015   Procedure: COLONOSCOPY WITH PROPOFOL;  Surgeon: Danie Binder, MD;  Location: [facility] ENDO [facility];  Service: Endoscopy;  Laterality: N/A;  930  . None to date  10/07/15  . TEE WITHOUT CARDIOVERSION N/A 09/10/2020   Procedure: TRANSESOPHAGEAL ECHOCARDIOGRAM (TEE) WITH PROPOFOL;  Surgeon: Satira Sark, MD;  Location: [facility] ORS;  Service: Cardiovascular;  Laterality: N/A;    Social History:  reports that he has been smoking cigarettes. He started smoking about 37 years ago. He has a 30.00 pack-year smoking history. He has never used smokeless tobacco. He reports current alcohol use of about  3.0 standard drinks of alcohol per week. He reports previous drug use.   No Known Allergies  Family History  Problem Relation Age of Onset   . COPD Father   . Stroke Brother   . Pulmonary embolism Sister   . Throat cancer Brother   . Arthritis Mother   . Diabetes Mother   . Stroke Mother   . Hypertension Brother   . Diabetes Brother   . Arthritis Brother   . Diabetes Sister   . Colon cancer Neg Hx      Prior to Admission medications   Medication Sig Start Date End Date Taking? Authorizing Provider  amLODipine (NORVASC) 5 MG tablet Take 1 tablet (5 mg total) by mouth daily. 11/20/20   Elenore Paddy, NP  atorvastatin (LIPITOR) 40 MG tablet Take 1 tablet (40 mg total) by mouth daily. 11/20/20   Elenore Paddy, NP  fexofenadine (ALLEGRA) 180 MG tablet Take 180 mg by mouth every other day.    [provider]  fluticasone (FLONASE) 50 MCG/ACT nasal spray Place into both nostrils daily.    [provider]  rivaroxaban (XARELTO) 20 MG TABS tablet Take 1 tablet (20 mg total) by mouth daily with supper. 11/20/20   Elenore Paddy, NP    Physical Exam: BP 140/81   Pulse 62   Temp 98.7 F (37.1 C) (Oral)   Resp 17   SpO2 100%   . General: 59 y.o. year-old male well developed well nourished in no acute distress. Alert and oriented x3. Marland Kitchen HEENT: NCAT, EOMI . Neck: Supple, trachea media . Cardiovascular: Regular rate and rhythm with no rubs or gallops.  No thyromegaly or JVD noted.  No lower extremity edema. 2/4 pulses in all 4 extremities. Marland Kitchen Respiratory: Clear to auscultation with no wheezes or rales. Good inspiratory effort. . Abdomen: Soft nontender nondistended with normal bowel sounds x4 quadrants. . Muskuloskeletal: No cyanosis, clubbing or edema noted bilaterally . Neuro: Mild left facial droop.  CN II-XII intact, strength, sensation, reflexes intact . Skin: No ulcerative lesions noted or rashes . Psychiatry: Judgement and insight appear normal. Mood is appropriate for condition and setting          Labs on Admission:  Basic Metabolic Panel: Recent Labs  Lab 11/28/20 0145  NA 136  K 3.8  CL 101   CO2 27  GLUCOSE 104*  BUN 16  CREATININE 1.76*  CALCIUM 9.5  MG 2.3  PHOS 3.3   Liver Function Tests: Recent Labs  Lab 11/28/20 0145  AST 24  ALT 25  ALKPHOS 79  BILITOT 1.1  PROT 8.0  ALBUMIN 4.4   No results for input(s): LIPASE, AMYLASE in the last 168 hours. No results for input(s): AMMONIA in the last 168 hours. CBC: Recent Labs  Lab 11/28/20 0145  WBC 6.2  NEUTROABS 3.6  HGB 14.0  HCT 41.3  MCV 87.5  PLT 194   Cardiac Enzymes: No results for input(s): CKTOTAL, CKMB, CKMBINDEX, TROPONINI in the last 168 hours.  BNP (last 3 results) No results for input(s): BNP in the last 8760 hours.  ProBNP (last 3 results) No results for input(s): PROBNP in the last 8760 hours.  CBG: No results for input(s): GLUCAP in the last 168 hours.  Radiological Exams on Admission: CT HEAD WO CONTRAST  Result Date: 11/28/2020 CLINICAL DATA:  Slurred speech EXAM: CT HEAD WITHOUT CONTRAST TECHNIQUE: Contiguous axial images were obtained from the base of the skull through the vertex without intravenous contrast.  COMPARISON:  None. FINDINGS: Brain: There is a acute/early subacute infarct within the anterior right MCA territory. No hemorrhage. No midline shift or other mass effect. No hydrocephalus. Vascular: No abnormal hyperdensity of the major intracranial arteries or dural venous sinuses. No intracranial atherosclerosis. Skull: The visualized skull base, calvarium and extracranial soft tissues are normal. Sinuses/Orbits: No fluid levels or advanced mucosal thickening of the visualized paranasal sinuses. No mastoid or middle ear effusion. The orbits are normal. IMPRESSION: Acute/early subacute infarct of the anterior right MCA territory. No hemorrhage or mass effect. Electronically Signed   By: Ulyses Jarred M.D.   On: 11/28/2020 03:16     EKG: I independently viewed the EKG done and my findings are as followed: Sinus bradycardia at rate of 50 bpm  Assessment/Plan Present on  Admission: . DVT (deep venous thrombosis) ([place]) . Pulmonary embolism ([place]) . CKD (chronic kidney disease) stage 3, GFR 30-59 ml/min (HCC) . Acute CVA (cerebrovascular accident) [facility])  Principal Problem:   Acute CVA (cerebrovascular accident) ([place]) Active Problems:   DVT (deep venous thrombosis) (HCC)   Pulmonary embolism (HCC)   CKD (chronic kidney disease) stage 3, GFR 30-59 ml/min (HCC)   History of hypertension   H/O medication noncompliance   Hyperlipidemia  Acute/early subacute CVA CT of head without contrast showed acute/early subacute infarct of the anterior right MCA territory. No hemorrhage or mass effect. Patient recently had multiple small infarcts during which TEE done at that time showed no PFO Patient will be admitted to telemetry unit  CT angiography of head and neck done in October 2021 showed no LVO  CT angiography of neck will be done in the morning Echocardiogram (limited) will be done in the morning MRI of brain without contrast in the morning Continue home statin  Patient states that he has been compliant with his Xarelto Continue fall precautions and neuro checks Lipid panel Hemoglobin A1c done in October of this year was 5.3 Continue PT/SLP/OT eval and treat Bedside swallow eval by nursing prior to diet There is no neurologist at [facility] till 12/16/20, Dr. Rory Percy was consulted and agreed for patient to be transferred to [facility] for further stroke work-up.  History of DVT/PE in setting of hypercoagulable disorder  Resume home Xarelto once MRI rules out intracranial hemorrhage  History of nocturnal bradycardia This was noted by cardiology during previous admission and was deemed not clinically significant per medical record. Consider outpatient evaluation for sleep apnea  History of hypertension Permissive hypertension will be allowed at this time due to acute/subacute CVA  CKD stage IIIb creatinine 1.76 (baseline creatinine 1.5-1.7). Creatinine is within  baseline range Renally adjust medications, avoid nephrotoxic agents/dehydration/hypotension  History of medication noncompliance Patient counseled on importance of being compliant with medications  DVT prophylaxis: SCDs; resume Xarelto once MRI of brain is negative for any intracranial bleed  Code Status: Full code  Family Communication: None at bedside  Disposition Plan:  Patient is from:                        home Anticipated DC to:                   home Anticipated DC date:               1 day Anticipated DC barriers:          Patient is unstable to be discharged at this time due to having acute/subacute CVA which require further work-up  Consults called: Neurology  Admission status: Observation    Bernadette Hoit MD [facility]  11/28/2020, 5:22 AM

## 2020-11-29 DIAGNOSIS — E782 Mixed hyperlipidemia: Secondary | ICD-10-CM | POA: Diagnosis not present

## 2020-11-29 DIAGNOSIS — I10 Essential (primary) hypertension: Secondary | ICD-10-CM | POA: Diagnosis not present

## 2020-11-29 DIAGNOSIS — N1832 Chronic kidney disease, stage 3b: Secondary | ICD-10-CM | POA: Diagnosis not present

## 2020-11-29 DIAGNOSIS — F17200 Nicotine dependence, unspecified, uncomplicated: Secondary | ICD-10-CM | POA: Diagnosis not present

## 2020-11-29 LAB — COMPREHENSIVE METABOLIC PANEL
ALT: 24 U/L (ref 0–44)
AST: 23 U/L (ref 15–41)
Albumin: 4.1 g/dL (ref 3.5–5.0)
Alkaline Phosphatase: 76 U/L (ref 38–126)
Anion gap: 9 (ref 5–15)
BUN: 15 mg/dL (ref 6–20)
CO2: 28 mmol/L (ref 22–32)
Calcium: 9.5 mg/dL (ref 8.9–10.3)
Chloride: 100 mmol/L (ref 98–111)
Creatinine, Ser: 1.59 mg/dL — ABNORMAL HIGH (ref 0.61–1.24)
GFR, Estimated: 50 mL/min — ABNORMAL LOW (ref >60)
Glucose, Bld: 95 mg/dL (ref 70–99)
Potassium: 3.7 mmol/L (ref 3.5–5.1)
Sodium: 137 mmol/L (ref 135–145)
Total Bilirubin: 1.6 mg/dL — ABNORMAL HIGH (ref 0.3–1.2)
Total Protein: 7.5 g/dL (ref 6.5–8.1)

## 2020-11-29 LAB — CBC
HCT: 43.2 % (ref 39.0–52.0)
Hemoglobin: 14.8 g/dL (ref 13.0–17.0)
MCH: 29.9 pg (ref 26.0–34.0)
MCHC: 34.3 g/dL (ref 30.0–36.0)
MCV: 87.3 fL (ref 80.0–100.0)
Platelets: 180 10*3/uL (ref 150–400)
RBC: 4.95 MIL/uL (ref 4.22–5.81)
RDW: 13.7 % (ref 11.5–15.5)
WBC: 6.3 10*3/uL (ref 4.0–10.5)
nRBC: 0 % (ref 0.0–0.2)

## 2020-11-29 LAB — PROTIME-INR
INR: 1.4 — ABNORMAL HIGH (ref 0.8–1.2)
Prothrombin Time: 16.9 seconds — ABNORMAL HIGH (ref 11.4–15.2)

## 2020-11-29 LAB — APTT: aPTT: 34 seconds (ref 24–36)

## 2020-11-29 NOTE — Care Management Important Message (Signed)
Important Message  Patient Details  Name: Matthew Wise MRN: [id number] Date of Birth: 11-24-1961   Medicare Important Message Given:  Yes     Corey Harold 11/29/2020, 2:26 PM

## 2020-11-29 NOTE — Plan of Care (Signed)
  Problem: Education: Goal: Knowledge of disease or condition will improve Outcome: Progressing Goal: Knowledge of secondary prevention will improve Outcome: Progressing Goal: Knowledge of patient specific risk factors addressed and post discharge goals established will improve Outcome: Progressing   Problem: Coping: Goal: Will verbalize positive feelings about self Outcome: Progressing   Problem: Self-Care: Goal: Ability to participate in self-care as condition permits will improve Outcome: Progressing Goal: Ability to communicate needs accurately will improve Outcome: Progressing   Problem: Nutrition: Goal: Risk of aspiration will decrease Outcome: Progressing   Problem: Ischemic Stroke/TIA Tissue Perfusion: Goal: Complications of ischemic stroke/TIA will be minimized Outcome: Progressing   Problem: Education: Goal: Knowledge of General Education information will improve Description: Including pain rating scale, medication(s)/side effects and non-pharmacologic comfort measures Outcome: Progressing   Problem: Health Behavior/Discharge Planning: Goal: Ability to manage health-related needs will improve Outcome: Progressing   Problem: Clinical Measurements: Goal: Ability to maintain clinical measurements within normal limits will improve Outcome: Progressing Goal: Will remain free from infection Outcome: Progressing Goal: Diagnostic test results will improve Outcome: Progressing Goal: Respiratory complications will improve Outcome: Progressing Goal: Cardiovascular complication will be avoided Outcome: Progressing   Problem: Activity: Goal: Risk for activity intolerance will decrease Outcome: Progressing   Problem: Nutrition: Goal: Adequate nutrition will be maintained Outcome: Progressing   Problem: Coping: Goal: Level of anxiety will decrease Outcome: Progressing   Problem: Elimination: Goal: Will not experience complications related to bowel  motility Outcome: Progressing Goal: Will not experience complications related to urinary retention Outcome: Progressing   Problem: Pain Managment: Goal: General experience of comfort will improve Outcome: Progressing   Problem: Safety: Goal: Ability to remain free from injury will improve Outcome: Progressing   Problem: Skin Integrity: Goal: Risk for impaired skin integrity will decrease Outcome: Progressing

## 2020-11-29 NOTE — Progress Notes (Signed)
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  This was initially noted on 11/25/2020.  She subsequently saw the patient in person on 11/27/2020 and noted the patient to continue to have slurred speech.  Apparently she noted some leg weakness and drooping on his face.  Unfortunately, she did not remember which side.  She stated that the patient's voice was "dragging".  The patient himself felt that his speech was normal.  He denied any new focal extremity weakness, visual disturbance, dysphasia, dysarthria, headache.  The patient endorses compliance with his rivaroxaban and Lipitor. Notably, the patient was admitted to the hospital from 09/07/2020 to 09/11/2020 during which time he had an acute embolic stroke.  MRI of the brain at that time showed multiple supratentorial and infratentorial acute and subacute infarcts in multiple vascular territories.  It was felt that this was secondary to poor compliance with his anticoagulation.  He had been off of his anticoagulation for about 4 months at that time.  During that hospitalization, the patient was restarted on his rivaroxaban.  He was seen by hematology who felt that his hypercoagulable state was secondary to his congenital interruption of his IVC and that further testing was not required at that time. As stated above, the patient now states that he has been compliant with his anticoagulation without missing any doses.  CT of the brain in the emergency department showed acute/early subacute infarcts of his anterior right MCA territory.  Neurology was consulted, and the patient is  being transferred to [facility] secondary to no neurology coverage at [facility]  Assessment/Plan: Acute ischemic stroke --Neurology Consult-->transfer to [facility] -PT/OT evaluation-->HHPT -Speech therapy eval--no limitation on diet -CT brain--acute/early subacute infarct of the anterior right MCA territory -MRI brain--mod size acute R-MCA infarct; small acute infarcts bilateral cerebellum -MRA brain--occlusion of R-MCA branch just beyond its origin -09/07/20 CTA H&N--no LVO, no hemodynamically significant stenosis of the carotids -11/28/2020 CTA Neck--pending -09/10/20 TEE--No LA, No LAA thrombus; Bubble study neg -LDL--83>>45 -09/08/20 HbA1C--5.3 -Antiplatelet/AC--Xarelto -09/26/20--30 day event monitor--sinus rhythm with PAC/PVCs  CKD stage 3b -baseline creatinine 1.5-1.7  Hyperlipidemia -Continue statin -09/08/2020 LDL 83  Essential hypertension -Holding amlodipine to allow for permissive hypertension  Hypercoagulable State -continue Xarelto  Tobacco Abuse -cessation discussed -35+ pack year history    Status is: Inpatient  The patient will require care spanning > 2 midnights and should be moved to inpatient because: Ongoing diagnostic testing needed not appropriate for outpatient work up  Dispo: The patient is from: Home  Anticipated d/c is to: Home  Anticipated d/c date is: 1 day  Patient currently is not medically stable to d/c.       Total time spent 35 minutes.  Greater than 50% spent face to face counseling and coordinating care.    Family Communication:  no Family at bedside  Consultants:  neurology  Code Status:  FULL  DVT Prophylaxis:  Xarelto   Procedures: As Listed in Progress Note Above  Antibiotics: None     Subjective: Patient denies fevers, chills, headache, chest pain, dyspnea, nausea, vomiting, diarrhea, abdominal pain, dysuria, hematuria, hematochezia, and  melena.  Denies any new slurred speech, visual disturbance or focal extremity weakness  Objective: Vitals:   11/28/20 1326 11/29/20 0156 11/29/20 0201 11/29/20 0543  BP: 130/60 (!) 141/78 (!) 150/75 (!) 154/82  Pulse: (!) 48 (!) 42 (!) 45 (!) 45  Resp: 18 17 17 16   Temp: 98 F (36.7 C) (!) 97.4 F (36.3 C) 97.6 F (36.4 C) 97.7 F (36.5 C)  TempSrc: Oral     SpO2: 100% 100% 100% 100%  Weight:      Height:        Intake/Output Summary (Last 24 hours) at 11/29/2020 1045 Last data filed at 11/29/2020 0900 Gross per 24 hour  Intake 720 ml  Output --  Net 720 ml   Weight change:  Exam:   General:  Pt is alert, follows commands appropriately, not in acute distress  HEENT: No icterus, No thrush, No neck mass, [place]/AT  Cardiovascular: RRR, S1/S2, no rubs, no gallops  Respiratory: CTA bilaterally, no wheezing, no crackles, no rhonchi  Abdomen: Soft/+BS, non tender, non distended, no guarding  Extremities: 1+ LE edema, No lymphangitis, No petechiae, No rashes, no synovitis  Neuro:  CN II-XII intact, strength 4/5 in RUE, RLE, strength 4/5 LUE, LLE; sensation intact bilateral; no dysmetria; babinski equivocal     Data Reviewed: I have personally reviewed following labs and imaging studies Basic Metabolic Panel: Recent Labs  Lab 11/28/20 0145 11/29/20 0654  NA 136 137  K 3.8 3.7  CL 101 100  CO2 27 28  GLUCOSE 104* 95  BUN 16 15  CREATININE 1.76* 1.59*  CALCIUM 9.5 9.5  MG 2.3  --   PHOS 3.3  --    Liver Function Tests: Recent Labs  Lab 11/28/20 0145 11/29/20 0654  AST 24 23  ALT 25 24  ALKPHOS 79 76  BILITOT 1.1 1.6*  PROT 8.0 7.5  ALBUMIN 4.4 4.1   No results for input(s): LIPASE, AMYLASE in the last 168 hours. No results for input(s): AMMONIA in the last 168 hours. Coagulation Profile: Recent Labs  Lab 11/28/20 0145 11/29/20 0654  INR 1.9* 1.4*   CBC: Recent Labs  Lab 11/28/20 0145 11/29/20 0654  WBC 6.2 6.3  NEUTROABS 3.6  --   HGB  14.0 14.8  HCT 41.3 43.2  MCV 87.5 87.3  PLT 194 180   Cardiac Enzymes: No results for input(s): CKTOTAL, CKMB, CKMBINDEX, TROPONINI in the last 168 hours. BNP: Invalid input(s): POCBNP CBG: No results for input(s): GLUCAP in the last 168 hours. HbA1C: Recent Labs    11/28/20 0145  HGBA1C 6.2*   Urine analysis:    Component Value Date/Time   COLORURINE YELLOW 11/28/2020 0121   APPEARANCEUR CLEAR 11/28/2020 0121   LABSPEC 1.023 11/28/2020 0121   PHURINE 6.0 11/28/2020 0121   GLUCOSEU NEGATIVE 11/28/2020 0121   HGBUR NEGATIVE 11/28/2020 0121   BILIRUBINUR NEGATIVE 11/28/2020 0121   KETONESUR NEGATIVE 11/28/2020 0121   PROTEINUR NEGATIVE 11/28/2020 0121   UROBILINOGEN >8.0 (H) 02/03/2015 1854   NITRITE NEGATIVE 11/28/2020 0121   LEUKOCYTESUR NEGATIVE 11/28/2020 0121   Sepsis Labs: @LABRCNTIP (procalcitonin:4,lacticidven:4) ) Recent Results (from the past 240 hour(s))  SARS CORONAVIRUS 2 (Devinne Epstein 6-24 HRS) Nasopharyngeal Nasopharyngeal Swab     Status: None   Collection Time: 11/28/20  3:29 AM   Specimen: Nasopharyngeal Swab  Result Value Ref Range Status   SARS Coronavirus 2 NEGATIVE NEGATIVE Final    Comment: (NOTE) SARS-CoV-2 target nucleic acids are NOT DETECTED.  The SARS-CoV-2 RNA is generally detectable in  upper and lower respiratory specimens during the acute phase of infection. Negative results do not preclude SARS-CoV-2 infection, do not rule out co-infections with other pathogens, and should not be used as the sole basis for treatment or other patient management decisions. Negative results must be combined with clinical observations, patient history, and epidemiological information. The expected result is Negative.  Fact Sheet for Patients: SugarRoll.be  Fact Sheet for Healthcare Providers: https://www.woods-mathews.com/  This test is not yet approved or cleared by the [place] FDA and  has been authorized for  detection and/or diagnosis of SARS-CoV-2 by FDA under an Emergency Use Authorization (EUA). This EUA will remain  in effect (meaning this test can be used) for the duration of the COVID-19 declaration under Se ction 564(b)(1) of the Act, 21 U.S.C. section 360bbb-3(b)(1), unless the authorization is terminated or revoked sooner.  Performed at [facility], [place] [address] [place], [place] [address]      Scheduled Meds: . atorvastatin  40 mg Oral Daily  . rivaroxaban  20 mg Oral Q supper   Continuous Infusions:  Procedures/Studies: CT HEAD WO CONTRAST  Result Date: 11/28/2020 CLINICAL DATA:  Slurred speech EXAM: CT HEAD WITHOUT CONTRAST TECHNIQUE: Contiguous axial images were obtained from the base of the skull through the vertex without intravenous contrast. COMPARISON:  None. FINDINGS: Brain: There is a acute/early subacute infarct within the anterior right MCA territory. No hemorrhage. No midline shift or other mass effect. No hydrocephalus. Vascular: No abnormal hyperdensity of the major intracranial arteries or dural venous sinuses. No intracranial atherosclerosis. Skull: The visualized skull base, calvarium and extracranial soft tissues are normal. Sinuses/Orbits: No fluid levels or advanced mucosal thickening of the visualized paranasal sinuses. No mastoid or middle ear effusion. The orbits are normal. IMPRESSION: Acute/early subacute infarct of the anterior right MCA territory. No hemorrhage or mass effect. Electronically Signed   By: Ulyses Jarred M.D.   On: 11/28/2020 03:16   CT ANGIO NECK W OR WO CONTRAST  Result Date: 11/28/2020 CLINICAL DATA:  59 year old male with slurred speech and subacute appearing anterior right MCA territory infarct on head CT earlier today. EXAM: CT ANGIOGRAPHY NECK TECHNIQUE: Multidetector CT imaging of the neck was performed using the standard protocol during bolus administration of intravenous contrast. Multiplanar CT image reconstructions and  MIPs were obtained to evaluate the vascular anatomy. Carotid stenosis measurements (when applicable) are obtained utilizing NASCET criteria, using the distal internal carotid diameter as the denominator. CONTRAST:  90mL OMNIPAQUE IOHEXOL 350 MG/ML SOLN COMPARISON:  Plain head CT 0300 hours today. CTA head and neck 09/07/2020. FINDINGS: Skeleton: C1-C2 cervical spine degeneration. No acute osseous abnormality identified. Upper chest: Negative; dilated azygos vein similar to October. Other neck: Stable thyroid, up to 1.8 cm hypodense nodules as reported in October. Right submandibular region probable sebaceous cyst is stable. No acute finding in the neck. Negative visible posterior fossa. Aortic arch: 3 vessel arch configuration. No arch atherosclerosis today. Right carotid system: Negative right CCA. Mild soft and calcified plaque at the right ICA origin is stable since October without stenosis. Visible right ICA siphon is patent to the anterior genu. Left carotid system: Negative left CCA. Trace calcified plaque at the left ICA origin is stable without stenosis. Visible left ICA siphon is patent with some calcified plaque. Vertebral arteries: Diminutive vertebral arteries, with fetal type bilateral PCA origins demonstrated in October. Both cervical vertebral arteries are highly diminutive but remain patent to the skull base and appears stable since October. Patent  V4 segments, vertebrobasilar junction, left PICA and dominant appearing right AICA. Review of the MIP images confirms the above findings IMPRESSION: 1. Stable cervical carotid and vertebral arteries since October CTA. No significant stenosis in the neck. 2. Intracranial CTA was not ordered or performed, and I am arranging for the [facility] MRI dept to add on intracranial MRA at this time to re-evaluate the Right MCA. Electronically Signed   By: Genevie Ann M.D.   On: 11/28/2020 09:28   MR ANGIO HEAD WO CONTRAST  Result Date: 11/28/2020 CLINICAL DATA:  Abnormal  CT with stroke EXAM: MRI HEAD WITHOUT CONTRAST MRA HEAD WITHOUT CONTRAST TECHNIQUE: Multiplanar, multiecho pulse sequences of the brain and surrounding structures were obtained without intravenous contrast. Angiographic images of the head were obtained using MRA technique without contrast. COMPARISON:  09/09/2020 FINDINGS: MRI HEAD Brain: Moderate size area of reduced diffusion is present in the right MCA territory involving the middle and inferior gyri of the frontal lobe including the operculum. No associated hemorrhage. Mass effect is minor. Small bilateral acute cerebellar infarcts are also present. Small chronic infarcts of the left caudate and bilateral cerebellum. Additional patchy foci of T2 hyperintensity in the supratentorial white matter are nonspecific probably reflect mild chronic microvascular ischemic changes. There is no intracranial mass. There is no hydrocephalus or extra-axial fluid collection. Vascular: Major vessel flow voids at the skull base are preserved. Diminutive vertebrobasilar flow voids secondary to bilateral fetal PCAs. Skull and upper cervical spine: Normal marrow signal is preserved. Sinuses/Orbits: Paranasal sinuses are aerated. Orbits are unremarkable. Other: Sella is unremarkable.  Mastoid air cells are clear. MRA HEAD Intracranial internal carotid arteries are patent. Right M1 MCA is patent. There is occlusion of a right MCA branch just beyond its origin from the distal M1 segment. Left middle and both anterior cerebral arteries are patent. Intracranial vertebral arteries and basilar artery are not well seen due to small caliber but are patent on prior CTA neck. Posterior cerebral arteries are patent. Bilateral posterior communicating arteries are present with fetal origin of the posterior cerebral arteries. IMPRESSION: Moderate size acute right MCA territory infarction without hemorrhagic transformation or significant mass effect. Small acute infarcts of the cerebellum  bilaterally. Small chronic infarcts and mild chronic microvascular ischemic changes. Occlusion of right MCA branch just beyond its origin from the distal M1 MCA. Electronically Signed   By: Macy Mis M.D.   On: 11/28/2020 10:23   MR BRAIN WO CONTRAST  Result Date: 11/28/2020 CLINICAL DATA:  Abnormal CT with stroke EXAM: MRI HEAD WITHOUT CONTRAST MRA HEAD WITHOUT CONTRAST TECHNIQUE: Multiplanar, multiecho pulse sequences of the brain and surrounding structures were obtained without intravenous contrast. Angiographic images of the head were obtained using MRA technique without contrast. COMPARISON:  09/09/2020 FINDINGS: MRI HEAD Brain: Moderate size area of reduced diffusion is present in the right MCA territory involving the middle and inferior gyri of the frontal lobe including the operculum. No associated hemorrhage. Mass effect is minor. Small bilateral acute cerebellar infarcts are also present. Small chronic infarcts of the left caudate and bilateral cerebellum. Additional patchy foci of T2 hyperintensity in the supratentorial white matter are nonspecific probably reflect mild chronic microvascular ischemic changes. There is no intracranial mass. There is no hydrocephalus or extra-axial fluid collection. Vascular: Major vessel flow voids at the skull base are preserved. Diminutive vertebrobasilar flow voids secondary to bilateral fetal PCAs. Skull and upper cervical spine: Normal marrow signal is preserved. Sinuses/Orbits: Paranasal sinuses are aerated. Orbits are unremarkable. Other: Sella  is unremarkable.  Mastoid air cells are clear. MRA HEAD Intracranial internal carotid arteries are patent. Right M1 MCA is patent. There is occlusion of a right MCA branch just beyond its origin from the distal M1 segment. Left middle and both anterior cerebral arteries are patent. Intracranial vertebral arteries and basilar artery are not well seen due to small caliber but are patent on prior CTA neck. Posterior  cerebral arteries are patent. Bilateral posterior communicating arteries are present with fetal origin of the posterior cerebral arteries. IMPRESSION: Moderate size acute right MCA territory infarction without hemorrhagic transformation or significant mass effect. Small acute infarcts of the cerebellum bilaterally. Small chronic infarcts and mild chronic microvascular ischemic changes. Occlusion of right MCA branch just beyond its origin from the distal M1 MCA. Electronically Signed   By: Macy Mis M.D.   On: 11/28/2020 10:23   ECHOCARDIOGRAM LIMITED  Result Date: 11/28/2020    ECHOCARDIOGRAM LIMITED REPORT   Patient Name:   TATSUO MCNELL Date of Exam: 11/28/2020 Medical Rec #:  [id number]       Height:       72.0 in Accession #:    [id number]      Weight:       263.0 lb Date of Birth:  1961-07-17      BSA:          2.394 m Patient Age:    36 years        BP:           129/73 mmHg Patient Gender: M               HR:           56 bpm. Exam Location:  [facility] Procedure: Limited Echo Indications:    Stroke 434.91 / I163.9  History:        Patient has prior history of Echocardiogram examinations, most                 recent 09/10/2020. TIA; Risk Factors:Current Smoker and                 Hypertension. Sickle Cell Trait, Pulmonary Embolism.  Sonographer:    Leavy Cella RDCS (AE) Referring Phys: [id number] OLADAPO ADEFESO IMPRESSIONS  1. Mild distal inferiolateral hypokinesis. IN comparison to images from October 2021, wall motion is relatively unchanged. . Left ventricular ejection fraction, by estimation, is 55%. There is moderate left ventricular hypertrophy.  2. Right ventricular systolic function is normal. The right ventricular size is normal.  3. Left atrial size was mildly dilated.  4. The mitral valve is abnormal. Mild mitral valve regurgitation.  5. The aortic valve is abnormal. Aortic valve regurgitation is mild. Mild aortic valve sclerosis is present, with no evidence of aortic valve stenosis.   6. The inferior vena cava is normal in size with greater than 50% respiratory variability, suggesting right atrial pressure of 3 mmHg. FINDINGS  Left Ventricle: Mild distal inferiolateral hypokinesis. IN comparison to images from October 2021, wall motion is relatively unchanged. Left ventricular ejection fraction, by estimation, is 55%. The left ventricular internal cavity size was normal in size. There is moderate left ventricular hypertrophy. Right Ventricle: The right ventricular size is normal. Right vetricular wall thickness was not assessed. Right ventricular systolic function is normal. Left Atrium: Left atrial size was mildly dilated. Right Atrium: Right atrial size was normal in size. Mitral Valve: The mitral valve is abnormal. There is mild thickening of the mitral valve leaflet(s). Mild  mitral annular calcification. Mild mitral valve regurgitation. Aortic Valve: The aortic valve is abnormal. Aortic valve regurgitation is mild. Mild aortic valve sclerosis is present, with no evidence of aortic valve stenosis. Pulmonic Valve: The pulmonic valve was normal in structure. Pulmonic valve regurgitation is not visualized. Aorta: The aortic root is normal in size and structure. Venous: The inferior vena cava is normal in size with greater than 50% respiratory variability, suggesting right atrial pressure of 3 mmHg. LEFT VENTRICLE PLAX 2D LVIDd:         4.66 cm  Diastology LVIDs:         3.33 cm  LV e' medial:    6.32 cm/s LV PW:         1.48 cm  LV E/e' medial:  12.5 LV IVS:        1.50 cm  LV e' lateral:   8.11 cm/s LVOT diam:     2.10 cm  LV E/e' lateral: 9.7 LVOT Area:     3.46 cm  LEFT ATRIUM           Index LA diam:      4.50 cm 1.88 cm/m LA Vol (A4C): 50.0 ml 20.89 ml/m   AORTA Ao Root diam: 2.80 cm MITRAL VALVE MV Area (PHT): 2.80 cm    SHUNTS MV Decel Time: 271 msec    Systemic Diam: 2.10 cm MV E velocity: 78.80 cm/s MV A velocity: 70.30 cm/s MV E/A ratio:  1.12 Dietrich Pates MD Electronically signed by  Dietrich Pates MD Signature Date/Time: 11/28/2020/3:36:32 PM    Final     Catarina Hartshorn, DO  [facility]  If 7PM-7AM, please contact night-coverage [email or web address] Password [email or web address] 11/29/2020, 10:45 AM   LOS: 1 day

## 2020-11-30 DIAGNOSIS — E782 Mixed hyperlipidemia: Secondary | ICD-10-CM | POA: Diagnosis not present

## 2020-11-30 DIAGNOSIS — F17200 Nicotine dependence, unspecified, uncomplicated: Secondary | ICD-10-CM | POA: Diagnosis not present

## 2020-11-30 NOTE — Progress Notes (Signed)
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 This was initially noted on 11/25/2020. She subsequently saw the patient in personon 11/27/2020 and noted the patient to continue to have slurred speech. Apparently she noted some leg weakness and drooping on his face. Unfortunately, she did not remember which side. She stated that the patient's voice was "dragging". The patient himself felt that his speech was normal. He denied any new focal extremity weakness, visual disturbance, dysphasia, dysarthria, headache.The patient endorses compliance with his rivaroxaban and Lipitor. Notably, the patient was admitted to the hospital from 09/07/2020 to 09/11/2020 during which time he had an acute embolic stroke. MRI of the brain at that time showed multiple supratentorial and infratentorial acute and subacute infarcts in multiple vascular territories. It was felt that this was secondary to poor compliance with his anticoagulation. He had been off of his anticoagulation for about 4 months at that time. During that hospitalization, the patient was restarted on his rivaroxaban. He was seen by hematology who felt that his hypercoagulable state was secondary to his congenital interruption of his IVC and that further testing was not required at that time. As stated above, the patient now states that he has been compliant with his anticoagulation without missing any doses. CT of the brain in the emergency department showed acute/early subacute infarcts of his anterior right MCA territory. Neurology was consulted, and the patient is  being transferred to [facility] secondary to no neurology coverage at [facility]  Assessment/Plan: Acute ischemic stroke --Neurology Consult-->transfer to [facility] -PT/OT evaluation-->HHPT -Speech therapy eval--no limitation on diet -CT brain--acute/early subacute infarct of the anterior right MCA territory -MRI brain--mod size acute R-MCA infarct; small acute infarcts bilateral cerebellum -MRA brain--occlusion of R-MCA branch just beyond its origin -09/07/20 CTA H&N--no LVO, no hemodynamically significant stenosis of the carotids -11/28/2020 CTANeck--no hemodynamic significant carotid stenosis -09/10/20 TEE--No LA, No LAA thrombus; Bubble study neg -LDL--83>>45 -10/10/21HbA1C--5.3 -Antiplatelet/AC--Xarelto -09/26/20--30 day event monitor--sinus rhythm with PAC/PVCs  CKD stage 3b -baseline creatinine 1.5-1.7  Hyperlipidemia -Continue statin -09/08/2020 LDL 83  Essential hypertension -Holding amlodipine to allow for permissive hypertension  Hypercoagulable State -continue Xarelto  Tobacco Abuse -cessation discussed -35+ pack year history    Status is: Inpatient  The patient will require care spanning > 2 midnights and should be moved to inpatient because:Ongoing diagnostic testing needed not appropriate for outpatient work up  Dispo: The patient is from:Home Anticipated d/c is [id number] Anticipated d/c date is: 1 day Patient currently is not medically stable to d/c.       Total time spent 35 minutes. Greater than 50% spent face to face counseling and coordinating care.    Family Communication:noFamily at bedside  Consultants:neurology  Code Status: FULL  DVT Prophylaxis:Xarelto   Procedures: As Listed in Progress Note Above  Antibiotics: None     Subjective: Patient denies fevers, chills, headache, chest pain, dyspnea, nausea, vomiting, diarrhea, abdominal pain, dysuria,  hematuria, hematochezia, and melena. Denies any headache, visual disturbance, dysarthria, focal extremity weakness  Objective: Vitals:   11/29/20 0543 11/29/20 2151 11/30/20 0620 11/30/20 1002  BP: (!) 154/82 (!) 159/82 (!) 156/75 Marland Kitchen)  150/84  Pulse: (!) 45 98 (!) 49 63  Resp: 16 18 16 19   Temp: 97.7 F (36.5 C) 98.9 F (37.2 C) 98.4 F (36.9 C)   TempSrc:   Oral   SpO2: 100% 100% 100% 100%  Weight:      Height:        Intake/Output Summary (Last 24 hours) at 11/30/2020 1352 Last data filed at 11/30/2020 0900 Gross per 24 hour  Intake 240 ml  Output --  Net 240 ml   Weight change:  Exam:   General:  Pt is alert, follows commands appropriately, not in acute distress  HEENT: No icterus, No thrush, No neck mass, [place]/AT  Cardiovascular: RRR, S1/S2, no rubs, no gallops  Respiratory: CTA bilaterally, no wheezing, no crackles, no rhonchi  Abdomen: Soft/+BS, non tender, non distended, no guarding  Extremities: No edema, No lymphangitis, No petechiae, No rashes, no synovitis  Neuro:  CN II-XII intact, strength 4/5 in RUE, RLE, strength 4/5 LUE, LLE; sensation intact bilateral; no dysmetria; babinski equivocal     Data Reviewed: I have personally reviewed following labs and imaging studies Basic Metabolic Panel: Recent Labs  Lab 11/28/20 0145 11/29/20 0654  NA 136 137  K 3.8 3.7  CL 101 100  CO2 27 28  GLUCOSE 104* 95  BUN 16 15  CREATININE 1.76* 1.59*  CALCIUM 9.5 9.5  MG 2.3  --   PHOS 3.3  --    Liver Function Tests: Recent Labs  Lab 11/28/20 0145 11/29/20 0654  AST 24 23  ALT 25 24  ALKPHOS 79 76  BILITOT 1.1 1.6*  PROT 8.0 7.5  ALBUMIN 4.4 4.1   No results for input(s): LIPASE, AMYLASE in the last 168 hours. No results for input(s): AMMONIA in the last 168 hours. Coagulation Profile: Recent Labs  Lab 11/28/20 0145 11/29/20 0654  INR 1.9* 1.4*   CBC: Recent Labs  Lab 11/28/20 0145 11/29/20 0654  WBC 6.2 6.3  NEUTROABS 3.6  --   HGB 14.0  14.8  HCT 41.3 43.2  MCV 87.5 87.3  PLT 194 180   Cardiac Enzymes: No results for input(s): CKTOTAL, CKMB, CKMBINDEX, TROPONINI in the last 168 hours. BNP: Invalid input(s): POCBNP CBG: No results for input(s): GLUCAP in the last 168 hours. HbA1C: Recent Labs    11/28/20 0145  HGBA1C 6.2*   Urine analysis:    Component Value Date/Time   COLORURINE YELLOW 11/28/2020 0121   APPEARANCEUR CLEAR 11/28/2020 0121   LABSPEC 1.023 11/28/2020 0121   PHURINE 6.0 11/28/2020 0121   GLUCOSEU NEGATIVE 11/28/2020 0121   HGBUR NEGATIVE 11/28/2020 0121   BILIRUBINUR NEGATIVE 11/28/2020 0121   KETONESUR NEGATIVE 11/28/2020 0121   PROTEINUR NEGATIVE 11/28/2020 0121   UROBILINOGEN >8.0 (H) 02/03/2015 1854   NITRITE NEGATIVE 11/28/2020 0121   LEUKOCYTESUR NEGATIVE 11/28/2020 0121   Sepsis Labs: @LABRCNTIP (procalcitonin:4,lacticidven:4) ) Recent Results (from the past 240 hour(s))  SARS CORONAVIRUS 2 (Lauran Romanski 6-24 HRS) Nasopharyngeal Nasopharyngeal Swab     Status: None   Collection Time: 11/28/20  3:29 AM   Specimen: Nasopharyngeal Swab  Result Value Ref Range Status   SARS Coronavirus 2 NEGATIVE NEGATIVE Final    Comment: (NOTE) SARS-CoV-2 target nucleic acids are NOT DETECTED.  The SARS-CoV-2 RNA is generally detectable in upper and lower respiratory specimens during the acute phase of infection. Negative results do not preclude SARS-CoV-2 infection, do not rule out co-infections with other pathogens, and should not be used as the sole basis for treatment or other  patient management decisions. Negative results must be combined with clinical observations, patient history, and epidemiological information. The expected result is Negative.  Fact Sheet for Patients: SugarRoll.be  Fact Sheet for Healthcare Providers: https://www.woods-mathews.com/  This test is not yet approved or cleared by the [place] FDA and  has been authorized for  detection and/or diagnosis of SARS-CoV-2 by FDA under an Emergency Use Authorization (EUA). This EUA will remain  in effect (meaning this test can be used) for the duration of the COVID-19 declaration under Se ction 564(b)(1) of the Act, 21 U.S.C. section 360bbb-3(b)(1), unless the authorization is terminated or revoked sooner.  Performed at [facility], [place] [address], [place], [place] [address]      Scheduled Meds: . atorvastatin  40 mg Oral Daily  . rivaroxaban  20 mg Oral Q supper   Continuous Infusions:  Procedures/Studies: CT HEAD WO CONTRAST  Result Date: 11/28/2020 CLINICAL DATA:  Slurred speech EXAM: CT HEAD WITHOUT CONTRAST TECHNIQUE: Contiguous axial images were obtained from the base of the skull through the vertex without intravenous contrast. COMPARISON:  None. FINDINGS: Brain: There is a acute/early subacute infarct within the anterior right MCA territory. No hemorrhage. No midline shift or other mass effect. No hydrocephalus. Vascular: No abnormal hyperdensity of the major intracranial arteries or dural venous sinuses. No intracranial atherosclerosis. Skull: The visualized skull base, calvarium and extracranial soft tissues are normal. Sinuses/Orbits: No fluid levels or advanced mucosal thickening of the visualized paranasal sinuses. No mastoid or middle ear effusion. The orbits are normal. IMPRESSION: Acute/early subacute infarct of the anterior right MCA territory. No hemorrhage or mass effect. Electronically Signed   By: Ulyses Jarred M.D.   On: 11/28/2020 03:16   CT ANGIO NECK W OR WO CONTRAST  Result Date: 11/28/2020 CLINICAL DATA:  60 year old male with slurred speech and subacute appearing anterior right MCA territory infarct on head CT earlier today. EXAM: CT ANGIOGRAPHY NECK TECHNIQUE: Multidetector CT imaging of the neck was performed using the standard protocol during bolus administration of intravenous contrast. Multiplanar CT image reconstructions and  MIPs were obtained to evaluate the vascular anatomy. Carotid stenosis measurements (when applicable) are obtained utilizing NASCET criteria, using the distal internal carotid diameter as the denominator. CONTRAST:  16mL OMNIPAQUE IOHEXOL 350 MG/ML SOLN COMPARISON:  Plain head CT 0300 hours today. CTA head and neck 09/07/2020. FINDINGS: Skeleton: C1-C2 cervical spine degeneration. No acute osseous abnormality identified. Upper chest: Negative; dilated azygos vein similar to October. Other neck: Stable thyroid, up to 1.8 cm hypodense nodules as reported in October. Right submandibular region probable sebaceous cyst is stable. No acute finding in the neck. Negative visible posterior fossa. Aortic arch: 3 vessel arch configuration. No arch atherosclerosis today. Right carotid system: Negative right CCA. Mild soft and calcified plaque at the right ICA origin is stable since October without stenosis. Visible right ICA siphon is patent to the anterior genu. Left carotid system: Negative left CCA. Trace calcified plaque at the left ICA origin is stable without stenosis. Visible left ICA siphon is patent with some calcified plaque. Vertebral arteries: Diminutive vertebral arteries, with fetal type bilateral PCA origins demonstrated in October. Both cervical vertebral arteries are highly diminutive but remain patent to the skull base and appears stable since October. Patent V4 segments, vertebrobasilar junction, left PICA and dominant appearing right AICA. Review of the MIP images confirms the above findings IMPRESSION: 1. Stable cervical carotid and vertebral arteries since October CTA. No significant stenosis in the neck. 2. Intracranial  CTA was not ordered or performed, and I am arranging for the [facility] MRI dept to add on intracranial MRA at this time to re-evaluate the Right MCA. Electronically Signed   By: Genevie Ann M.D.   On: 11/28/2020 09:28   MR ANGIO HEAD WO CONTRAST  Result Date: 11/28/2020 CLINICAL DATA:  Abnormal  CT with stroke EXAM: MRI HEAD WITHOUT CONTRAST MRA HEAD WITHOUT CONTRAST TECHNIQUE: Multiplanar, multiecho pulse sequences of the brain and surrounding structures were obtained without intravenous contrast. Angiographic images of the head were obtained using MRA technique without contrast. COMPARISON:  09/09/2020 FINDINGS: MRI HEAD Brain: Moderate size area of reduced diffusion is present in the right MCA territory involving the middle and inferior gyri of the frontal lobe including the operculum. No associated hemorrhage. Mass effect is minor. Small bilateral acute cerebellar infarcts are also present. Small chronic infarcts of the left caudate and bilateral cerebellum. Additional patchy foci of T2 hyperintensity in the supratentorial white matter are nonspecific probably reflect mild chronic microvascular ischemic changes. There is no intracranial mass. There is no hydrocephalus or extra-axial fluid collection. Vascular: Major vessel flow voids at the skull base are preserved. Diminutive vertebrobasilar flow voids secondary to bilateral fetal PCAs. Skull and upper cervical spine: Normal marrow signal is preserved. Sinuses/Orbits: Paranasal sinuses are aerated. Orbits are unremarkable. Other: Sella is unremarkable.  Mastoid air cells are clear. MRA HEAD Intracranial internal carotid arteries are patent. Right M1 MCA is patent. There is occlusion of a right MCA branch just beyond its origin from the distal M1 segment. Left middle and both anterior cerebral arteries are patent. Intracranial vertebral arteries and basilar artery are not well seen due to small caliber but are patent on prior CTA neck. Posterior cerebral arteries are patent. Bilateral posterior communicating arteries are present with fetal origin of the posterior cerebral arteries. IMPRESSION: Moderate size acute right MCA territory infarction without hemorrhagic transformation or significant mass effect. Small acute infarcts of the cerebellum  bilaterally. Small chronic infarcts and mild chronic microvascular ischemic changes. Occlusion of right MCA branch just beyond its origin from the distal M1 MCA. Electronically Signed   By: Macy Mis M.D.   On: 11/28/2020 10:23   MR BRAIN WO CONTRAST  Result Date: 11/28/2020 CLINICAL DATA:  Abnormal CT with stroke EXAM: MRI HEAD WITHOUT CONTRAST MRA HEAD WITHOUT CONTRAST TECHNIQUE: Multiplanar, multiecho pulse sequences of the brain and surrounding structures were obtained without intravenous contrast. Angiographic images of the head were obtained using MRA technique without contrast. COMPARISON:  09/09/2020 FINDINGS: MRI HEAD Brain: Moderate size area of reduced diffusion is present in the right MCA territory involving the middle and inferior gyri of the frontal lobe including the operculum. No associated hemorrhage. Mass effect is minor. Small bilateral acute cerebellar infarcts are also present. Small chronic infarcts of the left caudate and bilateral cerebellum. Additional patchy foci of T2 hyperintensity in the supratentorial white matter are nonspecific probably reflect mild chronic microvascular ischemic changes. There is no intracranial mass. There is no hydrocephalus or extra-axial fluid collection. Vascular: Major vessel flow voids at the skull base are preserved. Diminutive vertebrobasilar flow voids secondary to bilateral fetal PCAs. Skull and upper cervical spine: Normal marrow signal is preserved. Sinuses/Orbits: Paranasal sinuses are aerated. Orbits are unremarkable. Other: Sella is unremarkable.  Mastoid air cells are clear. MRA HEAD Intracranial internal carotid arteries are patent. Right M1 MCA is patent. There is occlusion of a right MCA branch just beyond its origin from the distal M1 segment. Left  middle and both anterior cerebral arteries are patent. Intracranial vertebral arteries and basilar artery are not well seen due to small caliber but are patent on prior CTA neck. Posterior  cerebral arteries are patent. Bilateral posterior communicating arteries are present with fetal origin of the posterior cerebral arteries. IMPRESSION: Moderate size acute right MCA territory infarction without hemorrhagic transformation or significant mass effect. Small acute infarcts of the cerebellum bilaterally. Small chronic infarcts and mild chronic microvascular ischemic changes. Occlusion of right MCA branch just beyond its origin from the distal M1 MCA. Electronically Signed   By: Macy Mis M.D.   On: 11/28/2020 10:23   ECHOCARDIOGRAM LIMITED  Result Date: 11/28/2020    ECHOCARDIOGRAM LIMITED REPORT   Patient Name:   LAMBERT BIRTS Date of Exam: 11/28/2020 Medical Rec #:  [id number]       Height:       72.0 in Accession #:    [id number]      Weight:       263.0 lb Date of Birth:  Oct 23, 1961      BSA:          2.394 m Patient Age:    84 years        BP:           129/73 mmHg Patient Gender: M               HR:           56 bpm. Exam Location:  [facility] Procedure: Limited Echo Indications:    Stroke 434.91 / I163.9  History:        Patient has prior history of Echocardiogram examinations, most                 recent 09/10/2020. TIA; Risk Factors:Current Smoker and                 Hypertension. Sickle Cell Trait, Pulmonary Embolism.  Sonographer:    Leavy Cella RDCS (AE) Referring Phys: [id number] OLADAPO ADEFESO IMPRESSIONS  1. Mild distal inferiolateral hypokinesis. IN comparison to images from October 2021, wall motion is relatively unchanged. . Left ventricular ejection fraction, by estimation, is 55%. There is moderate left ventricular hypertrophy.  2. Right ventricular systolic function is normal. The right ventricular size is normal.  3. Left atrial size was mildly dilated.  4. The mitral valve is abnormal. Mild mitral valve regurgitation.  5. The aortic valve is abnormal. Aortic valve regurgitation is mild. Mild aortic valve sclerosis is present, with no evidence of aortic valve stenosis.   6. The inferior vena cava is normal in size with greater than 50% respiratory variability, suggesting right atrial pressure of 3 mmHg. FINDINGS  Left Ventricle: Mild distal inferiolateral hypokinesis. IN comparison to images from October 2021, wall motion is relatively unchanged. Left ventricular ejection fraction, by estimation, is 55%. The left ventricular internal cavity size was normal in size. There is moderate left ventricular hypertrophy. Right Ventricle: The right ventricular size is normal. Right vetricular wall thickness was not assessed. Right ventricular systolic function is normal. Left Atrium: Left atrial size was mildly dilated. Right Atrium: Right atrial size was normal in size. Mitral Valve: The mitral valve is abnormal. There is mild thickening of the mitral valve leaflet(s). Mild mitral annular calcification. Mild mitral valve regurgitation. Aortic Valve: The aortic valve is abnormal. Aortic valve regurgitation is mild. Mild aortic valve sclerosis is present, with no evidence of aortic valve stenosis. Pulmonic Valve: The pulmonic valve was normal  in structure. Pulmonic valve regurgitation is not visualized. Aorta: The aortic root is normal in size and structure. Venous: The inferior vena cava is normal in size with greater than 50% respiratory variability, suggesting right atrial pressure of 3 mmHg. LEFT VENTRICLE PLAX 2D LVIDd:         4.66 cm  Diastology LVIDs:         3.33 cm  LV e' medial:    6.32 cm/s LV PW:         1.48 cm  LV E/e' medial:  12.5 LV IVS:        1.50 cm  LV e' lateral:   8.11 cm/s LVOT diam:     2.10 cm  LV E/e' lateral: 9.7 LVOT Area:     3.46 cm  LEFT ATRIUM           Index LA diam:      4.50 cm 1.88 cm/m LA Vol (A4C): 50.0 ml 20.89 ml/m   AORTA Ao Root diam: 2.80 cm MITRAL VALVE MV Area (PHT): 2.80 cm    SHUNTS MV Decel Time: 271 msec    Systemic Diam: 2.10 cm MV E velocity: 78.80 cm/s MV A velocity: 70.30 cm/s MV E/A ratio:  1.12 Dorris Carnes MD Electronically signed by  Dorris Carnes MD Signature Date/Time: 11/28/2020/3:36:32 PM    Final     Orson Eva, DO  [facility]  If 7PM-7AM, please contact night-coverage [email or web address] Password [email or web address] 11/30/2020, 1:52 PM   LOS: 2 days

## 2020-12-01 DIAGNOSIS — I639 Cerebral infarction, unspecified: Secondary | ICD-10-CM

## 2020-12-01 DIAGNOSIS — N1832 Chronic kidney disease, stage 3b: Secondary | ICD-10-CM | POA: Diagnosis not present

## 2020-12-01 NOTE — Consult Note (Addendum)
NEURO HOSPITALIST  CONSULT   Requesting Physician: Dr.Chatterjee    Chief Complaint: Slurred speech and left facial droop  History obtained from:  Patient     HPI:                                                                                                                                         Matthew Wise is a 60 y.o. male with a PMHx of HTN, Stroke (09/09/20, with no residual deficits), BLE DVT (on Xarelto) and PE (hypercoagulable), CKD who presented to [facility] with a 2 day hx of left facial droop, slurred speech and dragging of leg.  Per patient he did not notice any symptoms. He states that his family noted that he was talking funny (slurred speech), he was dragging his leg, and that the left side of his face was drooping. He had had a stroke in the recent past so he went to [facility].  Hospital course:  Hgba1c: 6.5  LDL: 45 ECHO: (limited): EF 55% TPA : no  CTA neck: no significant stenosis in the neck. Stable cervical carotid and vertebral arteries.  MRA: occlusion of right MCA branch just beyond its origin from the distal M1 MCA. CTH: 11/28/20: Acute/early subacute infarct of the anterior right MCA territory. No hemorrhage or mass effect MRI 11/28/20: Moderate sized acute right MCA territory infarction without hemorrhagic transformation or significant mass effect. Small acute infarcts of the cerebellum bilaterally. Small chronic infarcts and mild chronic microvascular ischemic Changes.   TTE at [facility] (12/30): 1. Mild distal inferiolateral hypokinesis. In comparison to images from  October 2021, wall motion is relatively unchanged. . Left ventricular  ejection fraction, by estimation, is 55%. There is moderate left  ventricular hypertrophy.  2. Right ventricular systolic function is normal. The right ventricular  size is normal.  3. Left atrial size was mildly dilated.  4. The mitral valve is abnormal. Mild mitral valve regurgitation.   5. The aortic valve is abnormal. Aortic valve regurgitation is mild. Mild  aortic valve sclerosis is present, with no evidence of aortic valve  stenosis.  6. The inferior vena cava is normal in size with greater than 50%  respiratory variability, suggesting right atrial pressure of 3 mmHg.   Stroke Hx: 09/09/20 per Dr. Gerilyn Pilgrim note: Had acute onset of dizziness, numbness of the left facial region and LUE. MRI at that time showed many small supratentorial and infratentorial acute/early subacute infarcts with involvement of multiple vascular territories, suggestive of a central embolic source   Date last known well: 11/26/20 tPA Given: no; outside of window (already on xarelto) Modified Rankin: Rankin Score=0 NIHSS:  3 (droop and drift left extremities)   Past Medical History:  Diagnosis Date  . Acute CVA (cerebrovascular accident) ([facility]) 09/09/2020  . Chronic anticoagulation 11/14/2014  . CKD (chronic kidney disease) stage 3, GFR 30-59 ml/min (HCC) 10/24/2019  . CKD (chronic kidney disease) stage 3, GFR 30-59 ml/min (HCC) 10/24/2019  . Clotting disorder ([place])   . 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 DVT (deep venous thrombosis) ([facility]) 06/15/2011  . DVT of leg (deep venous thrombosis) (HCC)    rt. leg  . Essential hypertension 11/28/2020  . H/O PE (pulmonary embolism) 06/15/2011  . Hyperlipidemia 11/28/2020  . 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 Prediabetes 10/24/2019    Past Surgical History:  Procedure Laterality Date  . COLONOSCOPY WITH PROPOFOL N/A 10/29/2015   Procedure: COLONOSCOPY WITH PROPOFOL;  Surgeon: Danie Binder, MD;  Location: [facility];  Service: Endoscopy;  Laterality: N/A;  930  . None to date  10/07/15  . TEE WITHOUT CARDIOVERSION N/A 09/10/2020   Procedure: TRANSESOPHAGEAL ECHOCARDIOGRAM (TEE) WITH PROPOFOL;  Surgeon: Satira Sark, MD;   Location: [facility] ORS;  Service: Cardiovascular;  Laterality: N/A;    Family History  Problem Relation Age of Onset  . COPD Father   . Stroke Brother   . Pulmonary embolism Sister   . Throat cancer Brother   . Arthritis Mother   . Diabetes Mother   . Stroke Mother   . Hypertension Brother   . Diabetes Brother   . Arthritis Brother   . Diabetes Sister   . Colon cancer Neg Hx          Social History:  reports that he has been smoking cigarettes. He started smoking about 37 years ago. He has a 30.00 pack-year smoking history. He has never used smokeless tobacco. He reports current alcohol use of about 3.0 standard drinks of alcohol per week. He reports previous drug use.  Allergies: No Known Allergies  Medications:                                                                                                                           Scheduled: . atorvastatin  40 mg Oral Daily  . rivaroxaban  20 mg Oral Q supper     ROS:  ROS was performed and is negative except as noted in HPI   General Examination:                                                                                                      Blood pressure 126/81, pulse (!) 48, temperature 97.8 F (36.6 C), temperature source Oral, resp. rate 18, height (!) 6" (0.152 m), weight 115.9 kg, SpO2 99 %.  Physical Exam  Constitutional: Appears well-developed and well-nourished.  Psych: Affect appropriate to situation Eyes: Normal external eye and conjunctiva. HENT: Normocephalic, no lesions, without obvious abnormality.   Musculoskeletal-no joint tenderness, deformity or swelling Cardiovascular: Normal rate and regular rhythm.  Respiratory: Effort normal, non-labored breathing saturations WNL GI: Soft.  No distension. There is no tenderness.  Skin: WDI  Neurological Examination Mental  Status: Alert, oriented name/age/month/year/place, thought content appropriate.  Speech fluent without evidence of aphasia.  Able to follow commands without difficulty. Cranial Nerves: II:  Visual fields grossly normal,  III,IV, VI: ptosis not present, extra-ocular motions intact bilaterally, pupils equal, round, reactive to light and accommodation V,VII: smile asymmetric, has subtle left facial droop, facial light touch sensation normal bilaterally VIII: hearing normal bilaterally IX,X: Phonation intact XI: Head is midline XII: midline tongue extension Motor:  hand grips: left hand grip slightly weaker than right, left deltoid and triceps are 4/5. Strength in RUE and BLE is 5/5 Normal tone throughout; no atrophy noted Sensory: light touch intact throughout, bilaterally Deep Tendon Reflexes: 2+ and symmetric biceps and patellae Plantars: Right: downgoing   Left: downgoing Cerebellar: normal finger-to-nose, slower on left with RAM,  normal heel-to-shin test Gait: Deferred   Lab Results: Basic Metabolic Panel: Recent Labs  Lab 11/28/20 0145 11/29/20 0654  NA 136 137  K 3.8 3.7  CL 101 100  CO2 27 28  GLUCOSE 104* 95  BUN 16 15  CREATININE 1.76* 1.59*  CALCIUM 9.5 9.5  MG 2.3  --   PHOS 3.3  --     CBC: Recent Labs  Lab 11/28/20 0145 11/29/20 0654  WBC 6.2 6.3  NEUTROABS 3.6  --   HGB 14.0 14.8  HCT 41.3 43.2  MCV 87.5 87.3  PLT 194 180    Lipid Panel: Recent Labs  Lab 11/28/20 0145  CHOL 148  TRIG 159*  HDL 71  CHOLHDL 2.1  VLDL 32  LDLCALC 45    Imaging: Results in HPI; no new imaging.    Laurey Morale, MSN, NP-C [facility] [phone] 12/01/2020, 8:58 AM   Assessment: 60 y.o. male with a PMHx of HTN, Stroke (09/09/20, with no residual deficits), BLE DVT (on Xarelto) and PE (hypercoagulable), CKD who presented to [facility] with a 2 day hx of left facial droop, slurred speech and dragging of leg. 1. CT head was concerning for an infarct in  the right MCA territory. MRI confirmed infarct in [facility] territory. MRA head confirmed occlusion of right MCA branch.  2. Patient was not a candidate for TPA d/t presenting outside of the window and Xarelto usage. Not a candidate for IR d/t outside timeframe.  3. TTE (12/30): Mild distal inferiolateral hypokinesis. In comparison to images from October 2021, wall motion is relatively unchanged. Left ventricular ejection fraction, by estimation, is 55%. 4. Stroke Risk Factors - hypercoagulable state, hyperlipidemia, hypertension and smoking 5. Exam reveals subtle left-sided motor findings.    Recommendations: -- BP management. Out of permissive HTN time window.  -- May need TEE given stroke recurrence on Xarelto.  -- May also have been noncompliant or semi-compliant with his Xarelto. Has a history of non-compliance with Coumadin in 2015. Would revisit compliance with the patient on Monday AM rounds.  -- Prophylactic therapy- Continue Xarelto -- Continue atorvastatin 40 mg daily -- PT consult, OT consult, Speech consult --Telemetry monitoring --Smoking cessation --Frequent neuro checks --Stroke swallow screen -- f/u outpatient neurology stroke clinic.  I have seen and examined the patient. I have formulated the assessment and recommendations. 60 year old male presenting to OSH with left sided weakness secondary to right MCA stroke. Stroke work up completed except for possible TEE and further interview regarding possible noncompliance or semicompliance with Xarelto.  Electronically signed: Dr. Kerney Elbe

## 2020-12-01 NOTE — Progress Notes (Signed)
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 This was initially noted on 11/25/2020. She subsequently saw the patient in personon 11/27/2020 and noted the patient to continue to have slurred speech. Apparently she noted some leg weakness and drooping on his face. Unfortunately, she did not remember which side. She stated that the patient's voice was "dragging". The patient himself felt that his speech was normal. He denied any new focal extremity weakness, visual disturbance, dysphasia, dysarthria, headache.The patient endorses compliance with his rivaroxaban and Lipitor. Notably, the patient was admitted to the hospital from 09/07/2020 to 09/11/2020 during which time he had an acute embolic stroke. MRI of the brain at that time showed multiple supratentorial and infratentorial acute and subacute infarcts in multiple vascular territories. It was felt that this was secondary to poor compliance with his anticoagulation. He had been off of his anticoagulation for about 4 months at that time. During that hospitalization, the patient was restarted on his rivaroxaban. He was seen by hematology who felt that his hypercoagulable state was secondary to his congenital interruption of his IVC and that further testing was not required at that time. As stated above, the patient now states that he has been compliant with his anticoagulation without missing any doses. CT of the brain in the emergency department showed acute/early subacute infarcts of his anterior right MCA territory. Neurology was  consulted, and the patient is being transferred to [facility] secondary to no neurology coverageat [facility]   Subjective:  Patient has no acute complaints other than wondering when he will be able to see the neurologist.  He states that his speech is back to baseline.  Notes he was able to walk around his room and go to the bathroom without difficulty.  Feels ready to go home.   Objective: Vitals:   12/01/20 0358 12/01/20 0822 12/01/20 1100 12/01/20 1156  BP: (!) 145/90 126/81  128/73  Pulse: (!) 52 (!) 48  (!) 51  Resp: 19 18  18   Temp: 98.1 F (36.7 C) 97.8 F (36.6 C)  97.8 F (36.6 C)  TempSrc: Oral Oral  Oral  SpO2: 100% 99%  100%  Weight: 115.9 kg     Height: (!) 6" (0.152 m)  6' (1.829 m)     Intake/Output Summary (Last 24 hours) at 12/01/2020 1630 Last data filed at 12/01/2020 1000 Gross per 24 hour  Intake 720 ml  Output -  Net 720 ml   Filed Weights   11/28/20 0607 12/01/20 0358  Weight: 119.3 kg 115.9 kg     Exam:  General: Reasonably well-appearing man lying in bed in no apparent distress speaking in full sentences without any dysarthria Eyes: sclera anicteric, conjuctiva mild injection bilaterally CVS: S1-S2, regular  Respiratory:  decreased air entry bilaterally secondary to decreased inspiratory effort, rales at bases  GI: NABS, soft, NT  LE: No edema.  Neuro: Documentation from note by neurology earlier today. Psych: patient is logical and coherent, judgement and insight appear normal, mood and affect appropriate to situation.   Assessment & Plan:   60 year old gentleman with right MCA stroke who has  completed his stroke work-up transferred to [facility] from [facility] for neurology consultation.  Neurology has seen patient today.  Appreciate their input.  Acute ischemic stroke Appears to have completed a stroke, his symptoms are back to baseline. He has had a complete work-up as noted below from Dr. Doristine Devoid note. Patient is to continue on  atorvastatin and Xarelto Await neurology recommendations regarding need for any antiplatelet therapy.  Copied from Dr. Doristine Devoid note: -PT/OT evaluation-->HHPT -Speech therapy eval--no limitation on diet -CT brain--acute/early subacute infarct of the anterior right MCA territory -MRI brain--mod size acute R-MCA infarct; small acute infarcts bilateral cerebellum -MRA brain--occlusion of R-MCA branch just beyond its origin -09/07/20 CTA H&N--no LVO, no hemodynamically significant stenosis of the carotids -11/28/2020 CTANeck--no hemodynamic significant carotid stenosis -09/10/20 TEE--No LA, No LAA thrombus; Bubble study neg -LDL--83>>45 -10/10/21HbA1C--5.3 -Antiplatelet/AC--Xarelto -09/26/20--30 day event monitor--sinus rhythm with PAC/PVCs  Dyslipidemia Continue statin  HTN Permissive hypertension, hold home medications including amlodipine  Known hypercoagulable state Continue Xarelto   DVT prophylaxis: On Xarelto Code Status: Full Family Communication: Patient's partner was on the phone throughout Disposition Plan:   Patient is from: Home  Anticipated Discharge Location: Home  Barriers to Discharge: None  Is patient medically stable for Discharge: For discharge in the morning   Consultants:  Neurology  Procedures:  As listed above  Antimicrobials:  None   Data Reviewed:  Basic Metabolic Panel: Recent Labs  Lab 11/28/20 0145 11/29/20 0654  NA 136 137  K 3.8 3.7  CL 101 100  CO2 27 28  GLUCOSE 104* 95  BUN 16 15  CREATININE 1.76* 1.59*  CALCIUM 9.5 9.5  MG 2.3  --   PHOS 3.3  --    Liver Function Tests: Recent Labs  Lab 11/28/20 0145 11/29/20 0654  AST 24 23  ALT 25 24  ALKPHOS 79 76  BILITOT 1.1 1.6*  PROT 8.0 7.5  ALBUMIN 4.4 4.1   No results for input(s): LIPASE, AMYLASE in the last 168 hours. No results for input(s): AMMONIA in the last 168 hours. CBC: Recent Labs  Lab 11/28/20 0145 11/29/20 0654  WBC 6.2 6.3  NEUTROABS 3.6  --    HGB 14.0 14.8  HCT 41.3 43.2  MCV 87.5 87.3  PLT 194 180   Cardiac Enzymes: No results for input(s): CKTOTAL, CKMB, CKMBINDEX, TROPONINI in the last 168 hours. BNP (last 3 results) No results for input(s): PROBNP in the last 8760 hours. CBG: No results for input(s): GLUCAP in the last 168 hours.  Recent Results (from the past 240 hour(s))  SARS CORONAVIRUS 2 (TAT 6-24 HRS) Nasopharyngeal Nasopharyngeal Swab     Status: None   Collection Time: 11/28/20  3:29 AM   Specimen: Nasopharyngeal Swab  Result Value Ref Range Status   SARS Coronavirus 2 NEGATIVE NEGATIVE Final    Comment: (NOTE) SARS-CoV-2 target nucleic acids are NOT DETECTED.  The SARS-CoV-2 RNA is generally detectable in upper and lower respiratory specimens during the acute phase of infection. Negative results do not preclude SARS-CoV-2 infection, do not rule out co-infections with other pathogens, and should not be used as the sole basis for treatment or other patient management decisions. Negative results must be combined with clinical observations, patient history, and epidemiological information. The expected result is Negative.  Fact Sheet for Patients: SugarRoll.be  Fact Sheet for Healthcare Providers: https://www.woods-mathews.com/  This test is not yet approved or cleared by the [place] FDA and  has been authorized for detection and/or diagnosis of SARS-CoV-2  by FDA under an Emergency Use Authorization (EUA). This EUA will remain  in effect (meaning this test can be used) for the duration of the COVID-19 declaration under Se ction 564(b)(1) of the Act, 21 U.S.C. section 360bbb-3(b)(1), unless the authorization is terminated or revoked sooner.  Performed at [facility], [address], [place], [place] [address]       Studies: No results found.   Scheduled Meds: . atorvastatin  40 mg Oral Daily  . rivaroxaban  20 mg Oral Q supper    Continuous Infusions:  Principal Problem:   Acute CVA (cerebrovascular accident) (HCC) Active Problems:   DVT (deep venous thrombosis) (HCC)   Pulmonary embolism (HCC)   CKD (chronic kidney disease) stage 3, GFR 30-59 ml/min (HCC)   Nicotine dependence   History of hypertension   H/O medication noncompliance   Acute ischemic stroke [facility])   Essential hypertension   Mixed hyperlipidemia     Emri Sample Orma Flaming, [facility]  If 7PM-7AM, please contact night-coverage [email or web address] Password [email or web address] 12/01/2020, 4:30 PM    LOS: 3 days

## 2020-12-01 NOTE — Progress Notes (Signed)
Occupational Therapy Treatment Patient Details Name: Matthew Wise MRN: [id number] DOB: 1961/10/11 Today's Date: 12/01/2020    History of present illness Matthew Wise is a 60 y.o. male Matthew significant for CVA without residual deficit, hypertension, bilateral lower extremity DVT and PE in setting of hypercoagulable disorder (on Xarelto), CKD 3B who presents to the emergency department due to  2-day onset of slurred speech, facial droop, dragging his leg.  CT of the brain in the ED showed acute/early subacute infarcts of his anterior right MCA territory. MRI revealed: Moderate size acute right MCA territory infarction without hemorrhagic transformation or significant mass effect, small acute infarcts of the cerebellum bilaterally, small chronic infarcts and mild chronic microvascular ischemic changes. Occlusion of right MCA branch just beyond its origin from the distal M1 MCA.   OT comments  Pt progressing towards OT goals this session. Pt very flat affect, and no family present to determine if this is baseline. Speech was clear and while there was generalized weakness in BUE and processing time seems delayed, Pt feels as though he is at baseline for ADL at this time. Able to demonstrate sink level grooming, LB dressing, toilet transfers and peri care - without cues for sequencing or LOB (althogh he did leave the sink running and required cues to turn off). OT updated dc for 24 hour supervision initially at dc without OT follow up - Next session to practice tub transfer to verify need for Shower DME (currently recommending shower chair for safety)   Follow Up Recommendations  Supervision/Assistance - 24 hour (initially)    Equipment Recommendations  Tub/shower seat    Recommendations for Other Services      Precautions / Restrictions Restrictions Weight Bearing Restrictions: No       Mobility Bed Mobility Overal bed mobility: Modified Independent                 Transfers Overall transfer level: Modified independent               General transfer comment: supervision for safety - but Pt did not have any LOB or require any assist    Balance Overall balance assessment: Mild deficits observed, not formally tested                                         ADL either performed or assessed with clinical judgement   ADL Overall ADL's : At baseline                                       General ADL Comments: Pt able to complete sink level grooming - but did leave the sink running with cues to turn off (he walked all the way back to the bed) he was able to complete UB and LB dressing (socks) and transfers at supervision level without DME.     Vision       Perception     Praxis      Cognition Arousal/Alertness: Awake/alert Behavior During Therapy: WFL for tasks assessed/performed;Flat affect Overall Cognitive Status: No family/caregiver present to determine baseline cognitive functioning Area of Impairment: Following commands;Problem solving                       Following Commands: Follows multi-step commands with increased time  Problem Solving: Slow processing General Comments: Pt is able to complete all tasks and answer all questions, but requires extra processing time to complete them.        Exercises     Shoulder Instructions       General Comments      Pertinent Vitals/ Pain       Pain Assessment: No/denies pain  Home Living                                          Prior Functioning/Environment              Frequency  Min 2X/week        Progress Toward Goals  OT Goals(current goals can now be found in the care plan section)  Progress towards OT goals: Progressing toward goals  Acute Rehab OT Goals Patient Stated Goal: return home with family to assist OT Goal Formulation: With patient Time For Goal Achievement: 12/11/20 Potential  to Achieve Goals: Good  Plan Discharge plan needs to be updated    Co-evaluation                 AM-PAC OT "6 Clicks" Daily Activity     Outcome Measure   Help from another person eating meals?: None Help from another person taking care of personal grooming?: None Help from another person toileting, which includes using toliet, bedpan, or urinal?: None Help from another person bathing (including washing, rinsing, drying)?: None Help from another person to put on and taking off regular upper body clothing?: None Help from another person to put on and taking off regular lower body clothing?: None 6 Click Score: 24    End of Session Equipment Utilized During Treatment: Gait belt  OT Visit Diagnosis: Muscle weakness (generalized) (M62.81);Hemiplegia and hemiparesis Hemiplegia - Right/Left: Left Hemiplegia - dominant/non-dominant: Non-Dominant Hemiplegia - caused by: Cerebral infarction   Activity Tolerance Patient tolerated treatment well   Patient Left in bed;with call bell/phone within reach   Nurse Communication Mobility status;Other (comment) (Pt states he is here to see Neurology, but no neurologist assigned? Pt had been moving around the room without supervision (OOB when OT arrived))        Time: 0908-0926 OT Time Calculation (min): 18 min  Charges: OT General Charges $OT Visit: 1 Visit OT Treatments $Self Care/Home Management : 8-22 mins  Nyoka Cowden OTR/L Acute Rehabilitation Services Pager: [phone] Office: [phone]   Evern Bio Marieanne Marxen 12/01/2020, 10:11 AM

## 2020-12-02 ENCOUNTER — Inpatient Hospital Stay (HOSPITAL_COMMUNITY): Payer: Medicare Other

## 2020-12-02 DIAGNOSIS — N1832 Chronic kidney disease, stage 3b: Secondary | ICD-10-CM | POA: Diagnosis not present

## 2020-12-02 DIAGNOSIS — I639 Cerebral infarction, unspecified: Secondary | ICD-10-CM | POA: Diagnosis not present

## 2020-12-02 MED ORDER — AMLODIPINE BESYLATE 5 MG PO TABS: 5.0000 mg | ORAL_TABLET | Freq: Every day | ORAL | Status: DC

## 2020-12-02 MED ORDER — AMLODIPINE BESYLATE 5 MG PO TABS
5.0000 mg | ORAL_TABLET | Freq: Every day | ORAL | Status: DC
  Administered 2020-12-03 – 2020-12-04 (×2): 5 mg via ORAL
  Filled 2020-12-02 (×2): qty 1

## 2020-12-02 NOTE — TOC Transition Note (Signed)
Transition of Care [facility]) - CM/SW Discharge Note   Patient Details  Name: Matthew Wise MRN: [id number] Date of Birth: December 13, 1960  Transition of Care [facility]) CM/SW Contact:  Durenda Guthrie, RN Phone Number: 12/02/2020, 10:30 AM   Clinical Narrative:   Patient is a 60 yr old male admitted with Right MCA. Patient was hospitaliized 10/9-10/21 with new stroke. Case manager spoke with patient concerning discharge needs. He states he lives alone but has family support. Choice offered for Home Health agency, referral called to Lorenza Chick, [facility] liaison. DME has been requested from [employer]. No further CM needs identified.     Final next level of care: Home w Home Health Services Barriers to Discharge: No Barriers Identified   Patient Goals and CMS Choice Patient states their goals for this hospitalization and ongoing recovery are:: go home CMS Medicare.gov Compare Post Acute Care list provided to:: Patient (discussed via phone) Choice offered to / list presented to : Patient  Discharge Placement                       Discharge Plan and Services In-house Referral: Interpreting Services Discharge Planning Services: CM Consult Post Acute Care Choice: Durable Medical Equipment          DME Arranged: Shower stool DME Agency: [employer] Date DME Agency Contacted: 12/02/20 Time DME Agency Contacted: 1028 Representative spoke with at DME Agency: Velna Hatchet HH Arranged: PT HH Agency: [facility] Date [facility] Agency Contacted: 12/02/20 Time HH Agency Contacted: 1026 Representative spoke with at [facility] Agency: Lorenza Chick  Social Determinants of Health (SDOH) Interventions     Readmission Risk Interventions No flowsheet data found.

## 2020-12-02 NOTE — Progress Notes (Addendum)
STROKE TEAM PROGRESS NOTE   INTERVAL HISTORY Patient is laying in bed comfortably. He has no family at bedside. He is asking when he can be discharged.  I have personally reviewed history of presenting illness with the patient, electronic medical records and imaging films in PACS.  He presented with slurred speech left facial droop and right neglect which appears to be improving.  MRI scan of the brain shows right MCA branch as well as tiny bilateral cerebellar infarcts.  MR angiogram of the brain shows right MCA branch occlusion corresponding to his infarct.  Urine drug screen is positive for cannabis OBJECTIVE   Vitals:   12/01/20 1956 12/01/20 2000 12/01/20 2349 12/02/20 0332  BP: (!) 154/85 (!) 154/85 (!) 162/82 132/62  Pulse: (!) 49 (!) 49 (!) 48 (!) 47  Resp: 18 18 18 16   Temp: 97.7 F (36.5 C) 97.7 F (36.5 C) 97.9 F (36.6 C) 98.2 F (36.8 C)  TempSrc: Oral Oral Oral Oral  SpO2: 100% 100% 100% 100%  Weight:      Height:       Lipid Panel:  Recent Labs  Lab 11/28/20 0145  CHOL 148  TRIG 159*  HDL 71  CHOLHDL 2.1  VLDL 32  LDLCALC 45   HgbA1c:  Recent Labs  Lab 11/28/20 0145  HGBA1C 6.2*   Urine Drug Screen:  Recent Labs  Lab 11/28/20 0121  LABOPIA NONE DETECTED  COCAINSCRNUR NONE DETECTED  LABBENZ NONE DETECTED  AMPHETMU NONE DETECTED  THCU POSITIVE*  LABBARB NONE DETECTED      Pertinent Imaging  11/28/20 MRI Brain WO Contrast  Moderate size acute right MCA territory infarction without hemorrhagic transformation or significant mass effect. Small acute infarcts of the cerebellum bilaterally. Small chronic infarcts and mild chronic microvascular ischemic changes. Occlusion of right MCA branch just beyond its origin from the distal M1 MCA  11/28/20 MRA Angio Head WO Contrast  Intracranial internal carotid arteries are patent. Right M1 MCA is patent. There is occlusion of a right MCA branch just beyond its origin from the distal M1 segment. Left middle and  both anterior cerebral arteries are patent.  Intracranial vertebral arteries and basilar artery are not well seen due to small caliber but are patent on prior CTA neck. Posterior cerebral arteries are patent. Bilateral posterior communicating arteries are present with fetal origin of the posterior cerebral arteries.  11/28/20 CT Angio Neck W WO Contrast  1. Stable cervical carotid and vertebral arteries since October CTA. No significant stenosis in the neck. 2. Intracranial CTA was not ordered or performed, and I am arranging for the [facility] MRI dept to add on intracranial MRA at this time to re-evaluate the Right MCA.  11/28/20 CT Head WO Contrast  Acute/early subacute infarct of the anterior right MCA territory. No hemorrhage or mass effect.  11/28/20 Echo Limited 1. Mild distal inferiolateral hypokinesis. IN comparison to images from  October 2021, wall motion is relatively unchanged. . Left ventricular  ejection fraction, by estimation, is 55%. There is moderate left  ventricular hypertrophy.  2. Right ventricular systolic function is normal. The right ventricular size is normal.  3. Left atrial size was mildly dilated.  4. The mitral valve is abnormal. Mild mitral valve regurgitation.  5. The aortic valve is abnormal. Aortic valve regurgitation is mild. Mild aortic valve sclerosis is present, with no evidence of aortic valve stenosis.  6. The inferior vena cava is normal in size with greater than 50% respiratory variability, suggesting right atrial  pressure of 3 mmHg.   12/03/19 VAS US Transcranial Doppler with Bubbles: Results pending   PHYSICAL EXAM Pleasant obese middle-age African-American male not in distress. . Afebrile. Head is nontraumatic. Neck is supple without bruit.    Cardiac exam no murmur or gallop. Lungs are clear to auscultation. Distal pulses are well felt. MS: AAOx4, follows commands  Speech: Fluent with dysarthric speech  CN: EOMI, VFF, Left facial droop,  Tongue midline, Shoulder shrug intact  Motor: 5/5 RUE +4/5 LUE 5/5 RLE +4/5 LLE, very subtle LLE drift  Sensation: Intact to light tough throughout  Coordination: No ataxia FNF  Gait: Deferred  NIHSS 3 ( left facial droop, LLE drift, dysarthria )    ASSESSMENT/PLAN Matthew Wise is a 60 y.o. male w/pmh of HTN, Stroke (09/09/20, with no residual deficits), BLE DVT (on Xarelto) and PE (hypercoagulable), CKD who presents with left facial droop, slurred speech and left leg weakness- sx onset 2 days prior to admission. He was found to have a R MCA Stroke. Stroke work up is as follows:   Stroke: R MCA Stroke, Etiology is cryptogenic; potential etiologies include a hypercoagulable state given history of DVT and PE. It is possible he may have not been therapeutically anticoagulated; he admits to taking Xarelto on an empty stomach which can affects Xarelto absorption. Cardioembolic cause is also of consideration given strokes in the anterior and posterior circulation; cardiac event monitor in the past has been negative + TEE- both done in October of 2021.   CT Head: No acute abnormality.   CTA Neck W WO Contrast: No significant stenosis in the neck  MRI Brain WO Contrast: Moderate size right MCA stroke, small acute infarcts of the cerebellum bilaterally, small chronic infarcts and mild chronic microvascular ischemic changes.   MRA Head WO Contrast: Occlusion of the right MCA branch just beyond its origin from the distal M1 MCA  Carotid Doppler w/Bubbles: pending. This was ordered as a part of stroke work up on 12/03/19 to evaluate for an intracardiac shunt indicative of PFO   Echocardiogram limited: pertinent for mild distal inferolateral hypokinesis, wall motion relatively unchanged compared to prior images from 10/21. LVEF 55 % ( view entire report above)   Consider ordering Factor V Leiden and ELISA based anticardiolipin and anti-beta2 GP1 antibody as these are hypercoagulable labs that are  not affected by Xarelto use   LDL 45  HgbA1c 6.2  VTE prophylaxis - Home Xarelto in place for VTE Prophylaxis     Diet   Diet Heart Room service appropriate? Yes; Fluid consistency: Thin     Xarelto (rivaroxaban) daily prior to admission, now on Xarelto (rivaroxaban) daily.   Therapy recommendations:  Home Health PT with intermittent supervision, OT recommending supervision/assistance   Disposition:  Likely home   Hypertension  Home meds: Amlodipine 5 mg QD, SBP trending 120-160 over the last 24 hours . Permissive hypertension (OK if < 220/120) but gradually normalize in 5-7 days.  . Recommend primary team to start Amlodipine 5 mg as he is technically 6 days out from his stroke at this point in time given sx started 2 days prior to admission  . Long-term BP goal normotensive  Hyperlipidemia  Home meds: None, Atorvastatin 40 mg initiated this admission   LDL 45, goal < 70  Continue statin at discharge  Prediabetes   HgbA1c 6.2, goal < 7.0  Other Stroke Risk Factors  Cigarette smoker, advised to stop smoking  Obesity, Body mass index is 34.65 kg/m.,  BMI >/= 30 associated with increased stroke risk, recommend weight loss, diet and exercise as appropriate   Hx stroke/TIA  Obstructive sleep apnea, not officially diagnosed in the past    Hospital day # [place], NP  [facility] Nurse Practitioner  Patient seen and discussed with attending physician Dr. Leonie Man  I have personally obtained history,examined this patient, reviewed notes, independently viewed imaging studies, participated in medical decision making and plan of care.ROS completed by me personally and pertinent positives fully documented  I have made any additions or clarifications directly to the above note. Agree with note above.  Patient has presented with recurrent embolic coordination of care and discussion with care team right MCA branch and bilateral cerebellum in the setting of  anticoagulation with Xarelto for recent DVT/PE in October 2021.  Recommend check TCD bubble study to look for small right-to-left shunt visualized on TEE done previously in October.  Check factor V Leiden anticardiolipin antibodies same not effectively ongoing Xarelto use.  Patient may also consider possible participation in the sleep smart study for obstructive sleep apnea as he appears to be at risk.  Greater than 50% time during this 35-minute visit was spent on counseling and coordination of care and discussion with care team. 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 Antony Contras, MD Medical Director [facility] Pager: [phone] 12/02/2020 4:15 PM To contact Stroke Continuity provider, please refer to [email or web address] After hours, contact General Neurology

## 2020-12-02 NOTE — Progress Notes (Signed)
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 This was initially noted on 11/25/2020. She subsequently saw the patient in personon 11/27/2020 and noted the patient to continue to have slurred speech. Apparently she noted some leg weakness and drooping on his face. Unfortunately, she did not remember which side. She stated that the patient's voice was "dragging". The patient himself felt that his speech was normal. He denied any new focal extremity weakness, visual disturbance, dysphasia, dysarthria, headache.The patient endorses compliance with his rivaroxaban and Lipitor. Notably, the patient was admitted to the hospital from 09/07/2020 to 09/11/2020 during which time he had an acute embolic stroke. MRI of the brain at that time showed multiple supratentorial and infratentorial acute and subacute infarcts in multiple vascular territories. It was felt that this was secondary to poor compliance with his anticoagulation. He had been off of his anticoagulation for about 4 months at that time. During that hospitalization, the patient was restarted on his rivaroxaban. He was seen by hematology who felt that his hypercoagulable state was secondary to his congenital interruption of his IVC and that further testing was not required at that time. As stated above, the patient now states that he has been compliant with his anticoagulation without missing any doses. CT of the brain in the emergency department showed acute/early subacute infarcts of his anterior right MCA territory. Neurology was  consulted, and the patient is being transferred to [facility] secondary to no neurology coverageat [facility]   Subjective:  Patient has no new concerns other than why he continues to have ongoing strokes.  Patient is very sure that he is always compliant with his Xarelto.  Denies missing any doses.  Does admit to may be a to 3-hour difference window within which he takes the medicine but he states it is never longer than that.   Objective: Vitals:   12/01/20 2349 12/02/20 0332 12/02/20 1135 12/02/20 1533  BP: (!) 162/82 132/62 (!) 144/81 123/73  Pulse: (!) 48 (!) 47 (!) 45 (!) 51  Resp: 18 16 18 16   Temp: 97.9 F (36.6 C) 98.2 F (36.8 C) 98 F (36.7 C) 98.6 F (37 C)  TempSrc: Oral Oral Oral Oral  SpO2: 100% 100% 100% 100%  Weight:      Height:        Intake/Output Summary (Last 24 hours) at 12/02/2020 1742 Last data filed at 12/02/2020 0325 Gross per 24 hour  Intake -  Output 200 ml  Net -200 ml   Filed Weights   11/28/20 0607 12/01/20 0358  Weight: 119.3 kg 115.9 kg     Exam:  General: Reasonably well-appearing man lying in bed in no apparent distress speaking in full sentences without any dysarthria Eyes: sclera anicteric, conjuctiva mild injection bilaterally CVS: S1-S2, regular  Respiratory:  decreased air entry bilaterally secondary to decreased inspiratory effort, rales at bases  GI: NABS, soft, NT  LE: No edema.  Neuro: Documentation from note by neurology earlier today. Psych: patient is logical and coherent, judgement and insight appear normal, mood and affect appropriate to situation.   Assessment & Plan:   60 year old  gentleman with known hypercoagulable state with history of DVT/PE on Xarelto presents with second stroke while on anticoagulation.  Patient underwent extensive work-up which is is unrevealing in terms of etiology of ongoing strokes.   Acute ischemic stroke Appears to have completed a stroke, his symptoms are back to baseline. Carotid  Doppler with bubble studies pending. Previous TEE in September 24, 2020 is negative. TCD bubble study done at bedside was "positive for small delayed right to left shunt after Valsalva indicative of small intrapulmonary shunt which is clinically insignificant" per note by Dr. Leonie Man today. We will also order factor V Leiden and anticardiolipin antibodies. Will refer patient to outpatient hematology/oncology for further consideration of hypercoagulable work-up since patient is continue to have strokes on what appears to be compliance with Xarelto.  Per neurology note however patient takes Xarelto on an empty stomach which can apparently affect absorption. Patient is clinically back to baseline.  Copied from PA Heard's note: R MCA Stroke, Etiology is cryptogenic; potential etiologies include a hypercoagulable state given history of DVT and PE. It is possible he may have not been therapeutically anticoagulated; he admits to taking Xarelto on an empty stomach which can affects Xarelto absorption. Cardioembolic cause is also of consideration given strokes in the anterior and posterior circulation; cardiac event monitor in the past has been negative + TEE- both done in 09/24/20.   CT Head: No acute abnormality.   CTA Neck W WO Contrast: No significant stenosis in the neck  MRI Brain WO Contrast: Moderate size right MCA stroke, small acute infarcts of the cerebellum bilaterally, small chronic infarcts and mild chronic microvascular ischemic changes.   MRA Head WO Contrast: Occlusion of the right MCA branch just beyond its origin from the distal M1 MCA  Carotid Doppler w/Bubbles: pending. This was ordered as a part of stroke work up on 59/3/21 to evaluate for an intracardiac shunt indicative of PFO   Echocardiogram limited: pertinent for mild distal inferolateral hypokinesis, wall motion relatively unchanged compared to prior images from 10/21. LVEF 55 % ( view entire report above)   Consider ordering  Factor V Leiden and ELISA based anticardiolipin and anti-beta2 GP1 antibody as these are hypercoagulable labs that are not affected by Xarelto use   LDL 45  HgbA1c 6.2   Dyslipidemia Continue atorvastatin  HTN We will restart amlodipine per neuro recommendations  Known hypercoagulable state Continue Xarelto   DVT prophylaxis: On Xarelto Code Status: Full Family Communication: Patient's partner was on the phone throughout Disposition Plan:   Patient is from: Home  Anticipated Discharge Location: Home  Barriers to Discharge: None  Is patient medically stable for Discharge: For discharge in the morning   Consultants:  Neurology  Procedures:  As listed above  Antimicrobials:  None   Data Reviewed:  Basic Metabolic Panel: Recent Labs  Lab 11/28/20 0145 11/29/20 0654  NA 136 137  K 3.8 3.7  CL 101 100  CO2 27 28  GLUCOSE 104* 95  BUN 16 15  CREATININE 1.76* 1.59*  CALCIUM 9.5 9.5  MG 2.3  --   PHOS 3.3  --    Liver Function Tests: Recent Labs  Lab 11/28/20 0145 11/29/20 0654  AST 24 23  ALT 25 24  ALKPHOS 79 76  BILITOT 1.1 1.6*  PROT 8.0 7.5  ALBUMIN 4.4 4.1   No results for input(s): LIPASE, AMYLASE in the last 168 hours. No results for input(s): AMMONIA in the last 168 hours. CBC: Recent Labs  Lab  11/28/20 0145 11/29/20 0654  WBC 6.2 6.3  NEUTROABS 3.6  --   HGB 14.0 14.8  HCT 41.3 43.2  MCV 87.5 87.3  PLT 194 180   Cardiac Enzymes: No results for input(s): CKTOTAL, CKMB, CKMBINDEX, TROPONINI in the last 168 hours. BNP (last 3 results) No results for input(s): PROBNP in the last 8760 hours. CBG: No results for input(s): GLUCAP in the last 168 hours.  Recent Results (from the past 240 hour(s))  SARS CORONAVIRUS 2 (TAT 6-24 HRS) Nasopharyngeal Nasopharyngeal Swab     Status: None   Collection Time: 11/28/20  3:29 AM   Specimen: Nasopharyngeal Swab  Result Value Ref Range Status   SARS Coronavirus 2 NEGATIVE NEGATIVE Final     Comment: (NOTE) SARS-CoV-2 target nucleic acids are NOT DETECTED.  The SARS-CoV-2 RNA is generally detectable in upper and lower respiratory specimens during the acute phase of infection. Negative results do not preclude SARS-CoV-2 infection, do not rule out co-infections with other pathogens, and should not be used as the sole basis for treatment or other patient management decisions. Negative results must be combined with clinical observations, patient history, and epidemiological information. The expected result is Negative.  Fact Sheet for Patients: SugarRoll.be  Fact Sheet for Healthcare Providers: https://www.woods-mathews.com/  This test is not yet approved or cleared by the [place] FDA and  has been authorized for detection and/or diagnosis of SARS-CoV-2 by FDA under an Emergency Use Authorization (EUA). This EUA will remain  in effect (meaning this test can be used) for the duration of the COVID-19 declaration under Se ction 564(b)(1) of the Act, 21 U.S.C. section 360bbb-3(b)(1), unless the authorization is terminated or revoked sooner.  Performed at [facility], [place] [address] [place], [place] [address]       Studies: VAS [place] TRANSCRANIAL DOPPLER W BUBBLES  Result Date: 12/02/2020  Transcranial Doppler with Bubble Indications: Stroke. Comparison Study: no prior Performing Technologist: Abram Sander RVS  Examination Guidelines: A complete evaluation includes B-mode imaging, spectral Doppler, color Doppler, and power Doppler as needed of all accessible portions of each vessel. Bilateral testing is considered an integral part of a complete examination. Limited examinations for reoccurring indications may be performed as noted.  Summary:  A vascular evaluation was performed. The right middle cerebral artery was studied. An IV was inserted into the patient's right forearm . Verbal informed consent was obtained.  HITS at rest:  none HITS during valsalva: small- spencer grade 1 *See table(s) above for TCD measurements and observations.    Preliminary      Scheduled Meds: . atorvastatin  40 mg Oral Daily  . rivaroxaban  20 mg Oral Q supper   Continuous Infusions:  Principal Problem:   Acute CVA (cerebrovascular accident) ([place]) Active Problems:   DVT (deep venous thrombosis) (HCC)   Pulmonary embolism (HCC)   CKD (chronic kidney disease) stage 3, GFR 30-59 ml/min (HCC)   Nicotine dependence   History of hypertension   H/O medication noncompliance   Acute ischemic stroke [facility])   Essential hypertension   Mixed hyperlipidemia     Dorota Heinrichs Derek Jack, [facility]  If 7PM-7AM, please contact night-coverage [email or web address] Password [email or web address] 12/02/2020, 5:42 PM    LOS: 4 days

## 2020-12-02 NOTE — Progress Notes (Signed)
TCD bubble has been completed.   Preliminary results in CV Proc.   Blanch Media 12/02/2020 3:01 PM

## 2020-12-03 ENCOUNTER — Other Ambulatory Visit (HOSPITAL_COMMUNITY): Payer: Self-pay | Admitting: Internal Medicine

## 2020-12-03 ENCOUNTER — Inpatient Hospital Stay (HOSPITAL_COMMUNITY): Payer: Medicare Other

## 2020-12-03 DIAGNOSIS — I639 Cerebral infarction, unspecified: Secondary | ICD-10-CM | POA: Diagnosis not present

## 2020-12-03 DIAGNOSIS — N1832 Chronic kidney disease, stage 3b: Secondary | ICD-10-CM | POA: Diagnosis not present

## 2020-12-03 LAB — BASIC METABOLIC PANEL
Anion gap: 11 (ref 5–15)
BUN: 18 mg/dL (ref 6–20)
CO2: 24 mmol/L (ref 22–32)
Calcium: 9.6 mg/dL (ref 8.9–10.3)
Chloride: 103 mmol/L (ref 98–111)
Creatinine, Ser: 1.72 mg/dL — ABNORMAL HIGH (ref 0.61–1.24)
GFR, Estimated: 46 mL/min — ABNORMAL LOW (ref >60)
Glucose, Bld: 93 mg/dL (ref 70–99)
Potassium: 3.5 mmol/L (ref 3.5–5.1)
Sodium: 138 mmol/L (ref 135–145)

## 2020-12-03 IMAGING — CT CT ANGIO CHEST
2 of 6 series · 18 of 36 positions shown · IV contrast (omnipaque)
Comparison: Prior CTA of the chest on [DATE]

CLINICAL DATA: Acute right-sided MCA territory cerebral infarction
and history of prior lower extremity DVT.

EXAM:
CT ANGIOGRAPHY CHEST WITH CONTRAST
TECHNIQUE: Multidetector CT imaging of the chest was performed using the
standard protocol during bolus administration of intravenous
contrast. Multiplanar CT image reconstructions and MIPs were
obtained to evaluate the vascular anatomy.
CONTRAST:  75mL OMNIPAQUE IOHEXOL 350 MG/ML SOLN

[Series 7: pe thins · axial · 0.85mm/px · z∈[+1153,+1401]mm · 17 of 394 slices shown]
[im 20/394  lung]
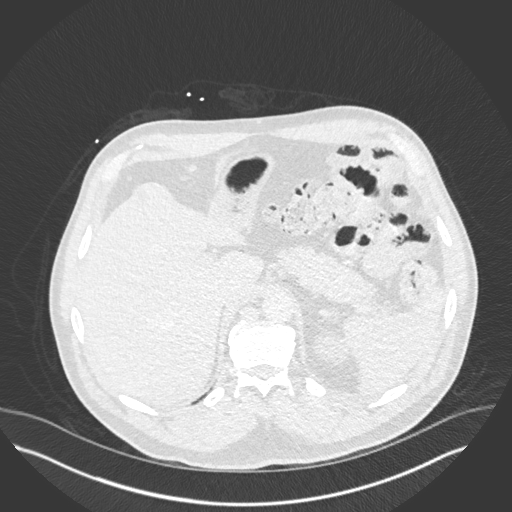
[im 40/394  mediastinal]
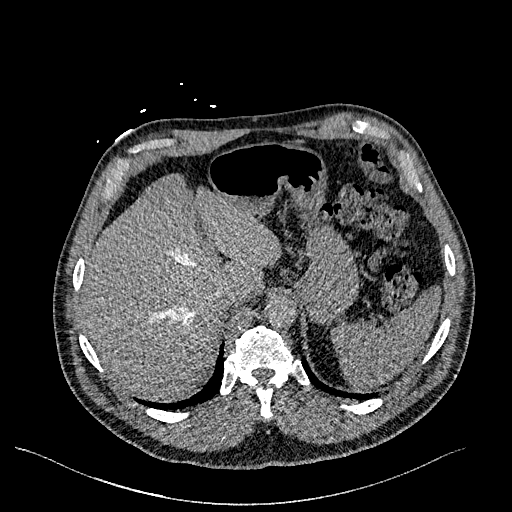
[im 59/394  lung]
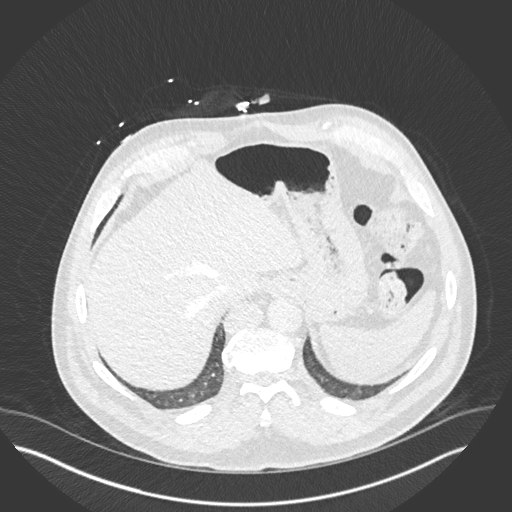
[im 79/394  mediastinal]
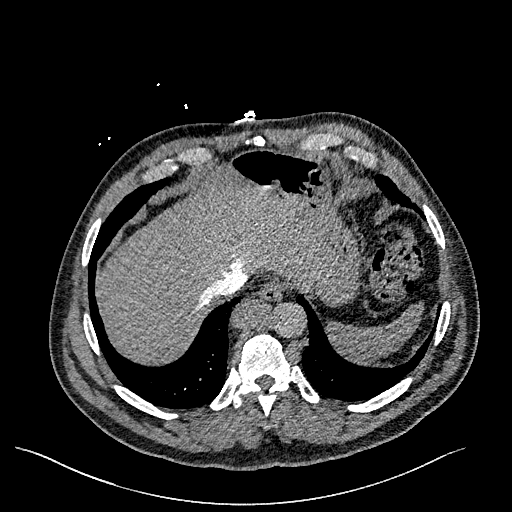
[im 118/394  lung]
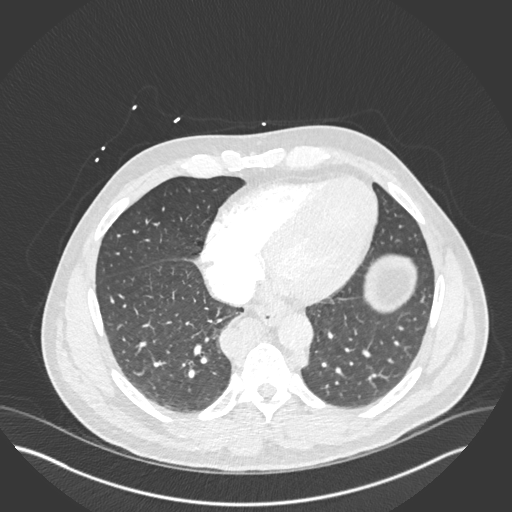
[im 138/394  mediastinal]
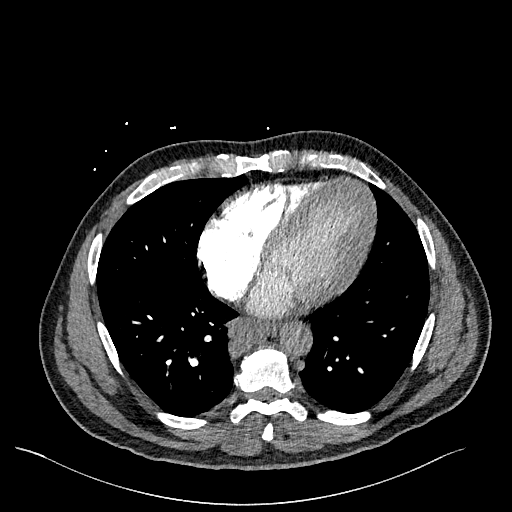
[im 158/394  lung]
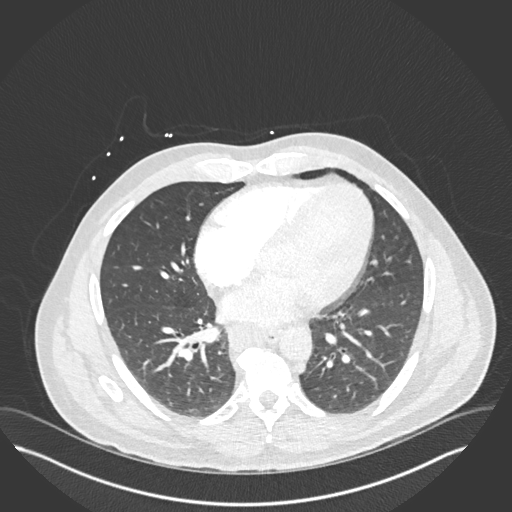
[im 177/394  mediastinal]
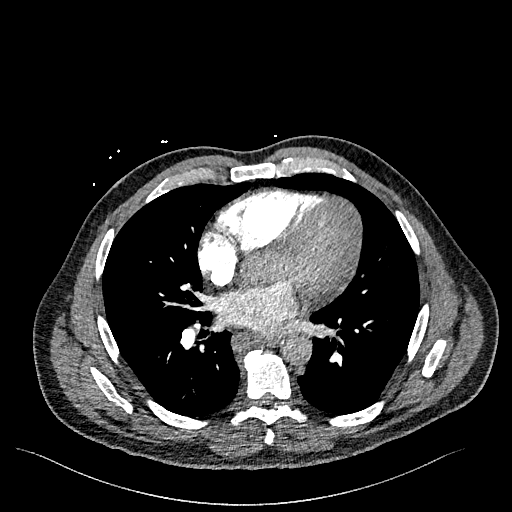
[im 197/394  lung]
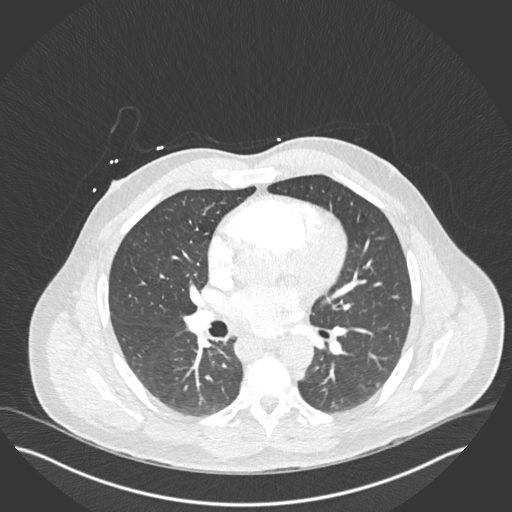
[im 217/394  mediastinal]
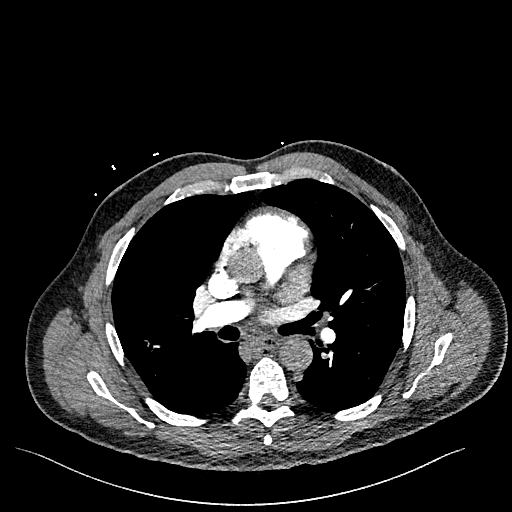
[im 236/394  lung]
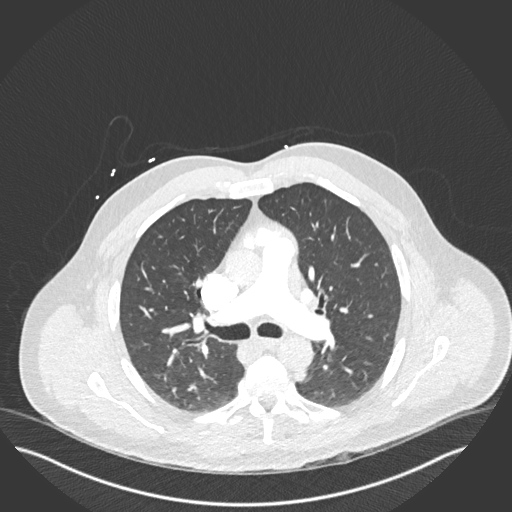
[im 256/394  mediastinal]
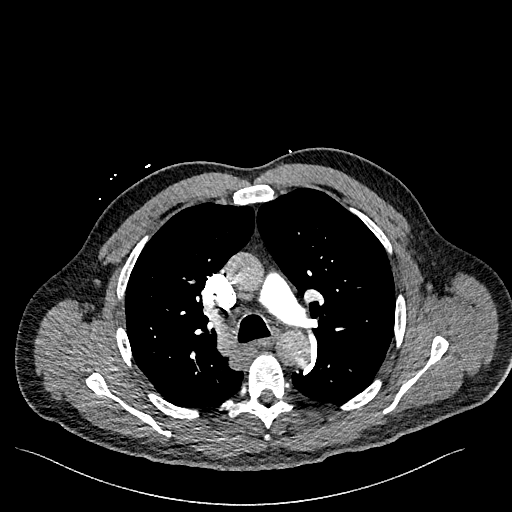
[im 276/394  lung]
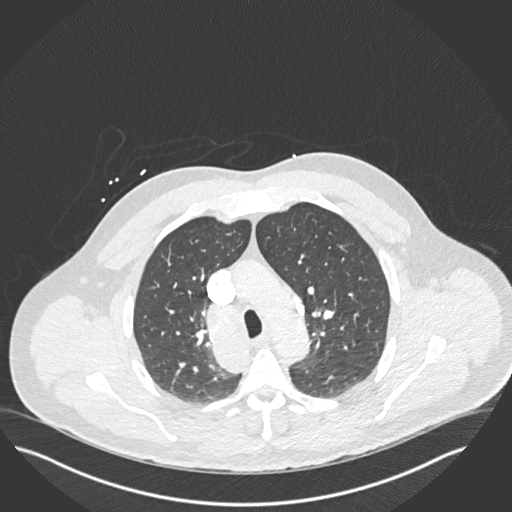
[im 315/394  mediastinal]
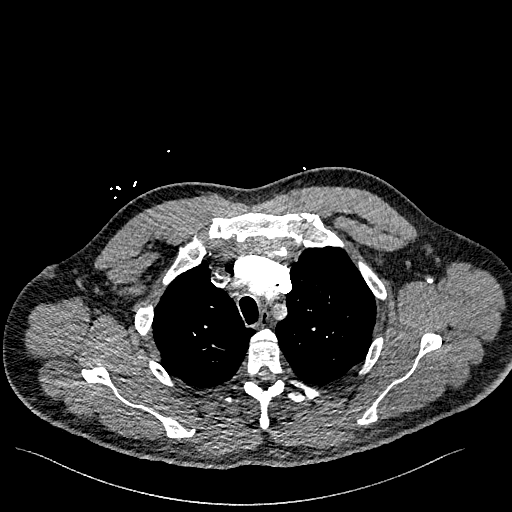
[im 335/394  lung]
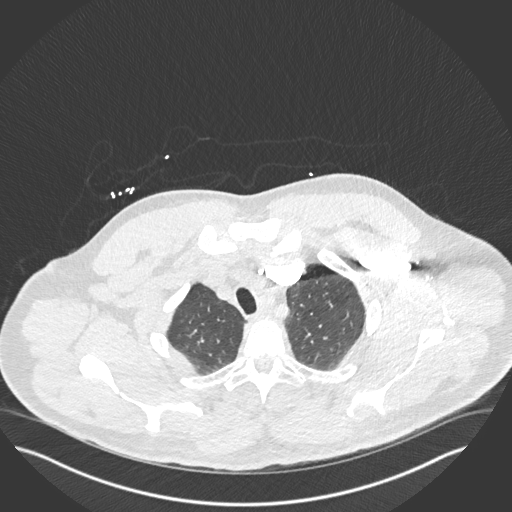
[im 354/394  mediastinal]
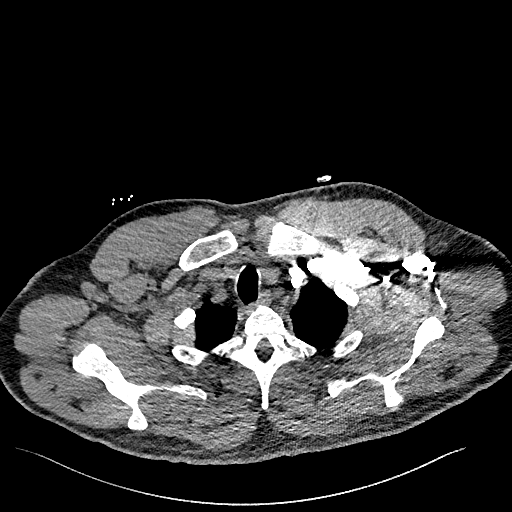
[im 374/394  lung]
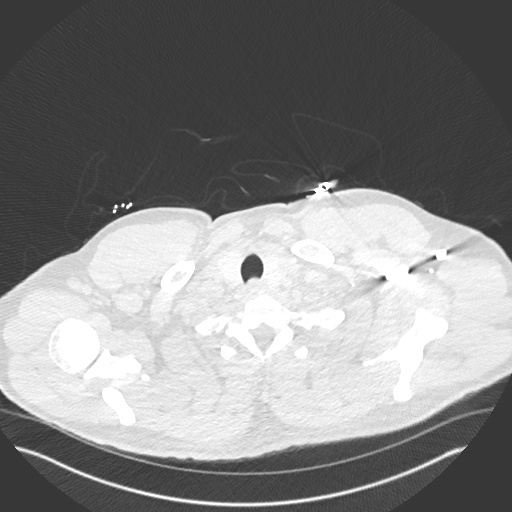

[Series 8: pe 2mm cor · coronal · 0.54mm/px · 1 of 151 slices shown]
[im 76/151  mediastinal]
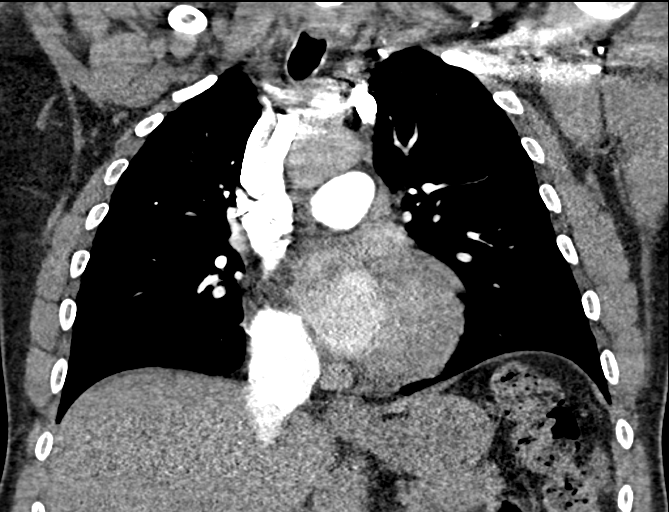

[18 of 36 positions shown; findings below may reference images not displayed]

FINDINGS: Cardiovascular: The contrast opacification is well timed for
pulmonary arterial and right heart evaluation. Pulmonary arteries
are normal in caliber. There is no evidence of pulmonary AV
malformation or AV fistula. The heart size is normal. Right-sided
chambers are opacified and there is no evidence of visible
intracardiac shunting. No pericardial fluid identified. The thoracic
aorta is normal in caliber. Again noted is a huge azygos vein which
is acting as a collateral for known chronic thrombosis of the iliac
veins and IVC.

Mediastinum/Nodes: No enlarged mediastinal, hilar, or axillary lymph
nodes. Thyroid gland, trachea, and esophagus demonstrate no
significant findings.

Lungs/Pleura: There is no evidence of pulmonary edema,
consolidation, pneumothorax, nodule or pleural fluid.

Upper Abdomen: No acute abnormality.

Musculoskeletal: No chest wall abnormality. No acute or significant
osseous findings.

Review of the MIP images confirms the above findings.
IMPRESSION: 1. No evidence of pulmonary embolism, pulmonary AV malformation or
pulmonary AV fistula.
2. No evidence of right to left intracardiac shunting by CTA.
3. Stable huge azygos vein which is acting as a collateral for known
chronic thrombosis of the iliac veins and IVC.

## 2020-12-03 MED ORDER — APIXABAN 5 MG PO TABS
5.0000 mg | ORAL_TABLET | Freq: Two times a day (BID) | ORAL | Status: DC
  Administered 2020-12-03 – 2020-12-04 (×2): 5 mg via ORAL
  Filled 2020-12-03 (×2): qty 1

## 2020-12-03 MED ORDER — IOHEXOL 350 MG/ML SOLN
75.0000 mL | Freq: Once | INTRAVENOUS | Status: AC | PRN
  Administered 2020-12-03: 75 mL via INTRAVENOUS

## 2020-12-03 MED ORDER — APIXABAN 5 MG PO TABS: 5.0000 mg | ORAL_TABLET | Freq: Two times a day (BID) | ORAL | 0 refills | Status: DC

## 2020-12-03 MED FILL — ELIQUIS 5 MG TABLET: 5 | 30 days supply | Qty: 60 | Fill #0

## 2020-12-03 NOTE — Discharge Instructions (Signed)

## 2020-12-03 NOTE — Progress Notes (Signed)
ANTICOAGULATION CONSULT NOTE - Initial Consult  Pharmacy Consult for apixaban Indication: stroke  No Known Allergies  Patient Measurements: Height: 6' (182.9 cm) Weight: 115.9 kg (255 lb 8.2 oz) IBW/kg (Calculated) : 77.6 Heparin Dosing Weight:   Vital Signs: Temp: 98.3 F (36.8 C) (01/04 1132) Temp Source: Oral (01/04 1132) BP: 147/91 (01/04 1132) Pulse Rate: 49 (01/04 1132)  Labs: Recent Labs    12/03/20 0613  CREATININE 1.72*    Estimated Creatinine Clearance: 61.5 mL/min (A) (by C-G formula based on SCr of 1.72 mg/dL (H)).   Medical History: Past Medical History:  Diagnosis Date  . Acute CVA (cerebrovascular accident) (HCC) 09/09/2020  . Chronic anticoagulation 11/14/2014  . CKD (chronic kidney disease) stage 3, GFR 30-59 ml/min (HCC) 10/24/2019  . CKD (chronic kidney disease) stage 3, GFR 30-59 ml/min (HCC) 10/24/2019  . Clotting disorder (HCC)   . 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 DVT (deep venous thrombosis) (HCC) 06/15/2011  . DVT of leg (deep venous thrombosis) (HCC)    rt. leg  . Essential hypertension 11/28/2020  . H/O PE (pulmonary embolism) 06/15/2011  . Hyperlipidemia 11/28/2020  . 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 Prediabetes 10/24/2019    Medications:  Medications Prior to Admission  Medication Sig Dispense Refill Last Dose  . amLODipine (NORVASC) 5 MG tablet Take 1 tablet (5 mg total) by mouth daily. 90 tablet 0 11/27/2020 at Unknown time  . atorvastatin (LIPITOR) 40 MG tablet Take 1 tablet (40 mg total) by mouth daily. 90 tablet 0 11/27/2020 at Unknown time  . fexofenadine (ALLEGRA) 180 MG tablet Take 180 mg by mouth every other day.   Past Week at Unknown time  . fluticasone (FLONASE) 50 MCG/ACT nasal spray Place into both nostrils daily.   Past Month at Unknown time  . rivaroxaban (XARELTO) 20 MG TABS tablet Take 1 tablet (20 mg  total) by mouth daily with supper. 30 tablet 3 11/27/2020 at 1930   Scheduled:  . amLODipine  5 mg Oral Daily  . 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 He had a hx of non-compliance to anticoag but not recently. He was admitted for a recurrent CVA on Xarelto. Team has decided to change to apixaban.   Age<80 Wt>60kg Scr>1.5 Last dose of Xarelto 1750 12/02/20  Goal of Therapy:   Monitor platelets by anticoagulation protocol: Yes   Plan:  Dc Xarelto Apixaban 5mg  PO BID starting this PM Apixaban edu Rx will follow peripherally  , PharmD, BCIDP, AAHIVP, CPP Infectious Disease Pharmacist 12/03/2020 2:06 PM

## 2020-12-03 NOTE — TOC CAGE-AID Note (Signed)
Transition of Care [facility]) - CAGE-AID Screening   Patient Details  Name: Matthew Wise MRN: [id number] Date of Birth: 1961/10/07  Transition of [facility]) CM/SW Contact:    Kermit Balo, RN Phone Number: 12/03/2020, 1:23 PM   Clinical Narrative: Pt did not confirm THC use but did state he drinks alcohol. CM did provide him resources for outpatient/ inpatient drug/ alcohol counseling. Pt was appreciative.   CAGE-AID Screening:    Have You Ever Felt You Ought to Cut Down on Your Drinking or Drug Use?: Yes Have People Annoyed You By Critizing Your Drinking Or Drug Use?: No Have You Felt Bad Or Guilty About Your Drinking Or Drug Use?: No Have You Ever Had a Drink or Used Drugs First Thing In The Morning to Steady Your Nerves or to Get Rid of a Hangover?: No CAGE-AID Score: 1  Substance Abuse Education Offered: Yes  Substance abuse interventions: Patient Counseling,Educational Materials

## 2020-12-03 NOTE — Progress Notes (Signed)
OT Cancellation Note  Patient Details Name: Matthew Wise MRN: [id number] DOB: 10-Oct-1961   Cancelled Treatment:    Reason Eval/Treat Not Completed: Other (comment) Pt talking on phone upon OTA arrival, will check back as time allows for OT session.  Lenor Derrick., COTA/L Acute Rehabilitation Services [phone]   Barron Schmid 12/03/2020, 8:18 AM

## 2020-12-03 NOTE — Progress Notes (Addendum)
STROKE TEAM PROGRESS NOTE   INTERVAL HISTORY Patient laying in bed comfortably. No family at bedside. He states that he is amenable to participating in the sleep smart stroke prevention study. A discussion was held with him regarding transitioning from Xarelto to Eliquis for anticoagulation given he had a stroke while taking Xarelto. He states he is ok with the stroke team initiating Eliquis but we will d/w primary attending first.   OBJECTIVE   Vitals:   12/03/20 0347 12/03/20 0759 12/03/20 0958 12/03/20 1132  BP: 139/89 136/88 (!) 156/88 (!) 147/91  Pulse: (!) 48 (!) 50  (!) 49  Resp: 16 18  18   Temp: 97.8 F (36.6 C) (!) 97.5 F (36.4 C)  98.3 F (36.8 C)  TempSrc: Oral Oral  Oral  SpO2: 100% 100%  100%  Weight:      Height:       Lipid Panel:  Recent Labs  Lab 11/28/20 0145  CHOL 148  TRIG 159*  HDL 71  CHOLHDL 2.1  VLDL 32  LDLCALC 45   HgbA1c:  Recent Labs  Lab 11/28/20 0145  HGBA1C 6.2*   Urine Drug Screen:  Recent Labs  Lab 11/28/20 0121  LABOPIA NONE DETECTED  COCAINSCRNUR NONE DETECTED  LABBENZ NONE DETECTED  AMPHETMU NONE DETECTED  THCU POSITIVE*  LABBARB NONE DETECTED    Pertinent Imaging   11/28/20 MRI Brain WO Contrast  Moderate size acute right MCA territory infarction without hemorrhagic transformation or significant mass effect. Small acute infarcts of the cerebellum bilaterally. Small chronic infarcts and mild chronic microvascular ischemic changes. Occlusion of right MCA branch just beyond its origin from the distal M1 MCA.  11/28/20 MRA Angio Head WO Contrast  Intracranial internal carotid arteries are patent. Right M1 MCA is patent. There is occlusion of a right MCA branch just beyond its origin from the distal M1 segment. Left middle and both anterior cerebral arteries are patent. Intracranial vertebral arteries and basilar artery are not well seen due to small caliber but are patent on prior CTA neck. Posterior cerebral arteries are  patent. Bilateral posterior communicating arteries are present with fetal origin of the posterior cerebral arteries.  11/28/20 CT Angio Neck W WO Contrast  1. Stable cervical carotid and vertebral arteries since October CTA. No significant stenosis in the neck. 2. Intracranial CTA was not ordered or performed, and I am arranging for the [facility] MRI dept to add on intracranial MRA at this time to re-evaluate the Right MCA.  11/28/20 CT Head WO Contrast  Acute/early subacute infarct of the anterior right MCA territory. No hemorrhage or mass effect.  11/28/20 Echo Limited 1. Mild distal inferiolateral hypokinesis. IN comparison to images from  October 2021, wall motion is relatively unchanged. . Left ventricular  ejection fraction, by estimation, is 55%. There is moderate left  ventricular hypertrophy.  2. Right ventricular systolic function is normal. The right ventricular size is normal.  3. Left atrial size was mildly dilated.  4. The mitral valve is abnormal. Mild mitral valve regurgitation.  5. The aortic valve is abnormal. Aortic valve regurgitation is mild. Mild aortic valve sclerosis is present, with no evidence of aortic valve stenosis.  6. The inferior vena cava is normal in size with greater than 50% respiratory variability, suggesting right atrial pressure of 3 mmHg.   12/03/19 VAS [place] Transcranial Doppler with Bubbles: Positive TCD Bubble study with delayed HITS noted after valasava indicative of a small intrapulmonary shunt  12/04/19 CT Angio Chest PE W or  WO Contrast  1. No evidence of pulmonary embolism, pulmonary AV malformation or pulmonary AV fistula. 2. No evidence of right to left intracardiac shunting by CTA. 3. 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  PHYSICAL EXAM Pleasant obese middle-age African-American male not in distress. . Afebrile. Head is nontraumatic. Neck is supple without bruit.     Cardiac exam no murmur or gallop. Lungs are clear to auscultation. Distal pulses are well felt. MS: AAOx4, follows commands  Speech: Fluent with mildly dysarthric speech  CN: EOMI, VFF, Left facial droop, Tongue midline, Shoulder shrug intact  Motor: 5/5 RUE +4/5 LUE 5/5 RLE +4/5 LLE, very subtle LLE drift  Sensation: Intact to light tough throughout  Coordination: No ataxia FNF  Gait: Deferred  NIHSS 3 ( left facial droop, LLE drift, dysarthria )  Premorbid MRS 0 ASSESSMENT/PLAN Mr. PAZ BOULAIS is a 60 y.o. male w/pmh of HTN, Stroke (09/09/20, with no residual deficits), BLE DVT (on Xarelto) and PE (hypercoagulable), CKD who presents with left facial droop, slurred speech and left leg weakness- sx onset 2 days prior to admission. He was found to have a R MCA Stroke. Of note, he was hospitalized in October of 2021 with multiple small infarcts bilaterally. At that time it was believed he had a stroke due to poor adherence to [facility]- had not taken Xarelto due to financial constraints for 4-6 months prior to admission.   R MCA Stroke  His stroke work up is complete at this time(view imaging studies + labs below). The etiology of his stroke is cryptogenic. Potential etiologies include a hypercoagulable state + cardioembolic source.   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 Cardioembolic cause is also of consideration given strokes in the anterior and posterior circulation; both cardiac event monitor + TEE done in October of 2021 were negative for atrial fibrillation/LV thrombus respectively.  This admission, given delayed bubbles noted on TCD indicative of a possible pulmonary shunt, CT Chest was done to evaluate for pulmonary AVF that could cause his stroke- imaging was negative for pulmonary AVF. Factor V Leiden + Anticardiolipin antibodies were ordered this admission  as a part of hypercoag work up and are pending.   Of note, the patient reports compliance with his Xarelto, though he does note taking it on an empty stomach which can impact absorption. Given he had a new stroke while taking Xarelto his anticoagulation was transitioned to Eliquis this admission after discussing this with the patient.   Continue Eliquis + Atorvastatin 40 for secondary stroke prevention   CT Head: No acute abnormality.   CTA Neck W WO Contrast: No significant stenosis in the neck  MRI Brain WO Contrast: Moderate size right MCA stroke, small acute infarcts of the cerebellum bilaterally, small chronic infarcts and mild chronic microvascular ischemic changes.   MRA Head WO Contrast: Occlusion of the right MCA branch just beyond its origin from the distal M1 MCA  Carotid Doppler w/Bubbles: Positive bubble study with delayed HITS indicative of small intrapulmonary shunt   Echocardiogram limited: pertinent for mild distal inferolateral hypokinesis, wall motion relatively unchanged compared to prior images from 10/21. LVEF 55 % ( view entire report above)   Stroke labs: LDL 45, HgbA1c 6.2  Follow up Factor V Leiden + Anticardiolipin antibodies in outpatient stroke clinic. Referral placed for patient  to follow up with stroke MD  Hypertension  He has a history of hypertension and takes Amlodipine 5 mg at home. Originally this was held to allow for permissive HTN and was resumed on 12/04/19. Current SBP goal is < 180 and long term SBP goal is normotensive.   Hyperlipidemia  He has a history of HLD and was taking Atorvastatin 40 mg at home- this was resumed this admission. His LDL is at goal from a secondary stroke prevention standpoint.   Prediabetes   HgbA1c 6.2, goal < 7.0. No intervention needed, he can follow up with his PCP for prediabetes at discharge and at their discretion Metformin can be initiated.   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 Onc has recommended lifelong AC. Recommend he follow up with Oncology at discharge given he has not seen them since 2018 and his anticoagulation was transitioned to Eliquis this admission.   Other Stroke Risk Factors  Cigarette smoker, advised to stop smoking  Obesity, Body mass index is 34.65 kg/m., BMI >/= 30 associated with increased stroke risk, recommend weight loss, diet and exercise as appropriate   Hx stroke/TIA  Obstructive sleep apnea, not officially diagnosed in the past, was enrolled in sleep study this admission   Hospital day # 5  Stark Jock, NP  [facility] Nurse Practitioner  Patient seen and discussed with attending physician Dr. Pearlean Brownie    I have personally obtained history,examined this patient, reviewed notes, independently viewed imaging studies, participated in medical decision making and plan of care.ROS completed by me personally and pertinent positives fully documented  I have made any additions or clarifications directly to the above note. Agree with note above.  Patient has presented with recurrent embolic   right MCA  branch and bilateral cerebellum infarcts in the setting of anticoagulation with Xarelto for recent DVT/PE in October 2021.  He admits he may not have been exactly compliant with Xarelto and taking it consistently at the same time was often taking it on an empty stomach.  TCD bubble study showed delayed positive for right-to-left shunt but CT angiogram of the chest showed no evidence of pulmonary AV fistula..  Check factor V Leiden anticardiolipin antibodies same not effectively ongoing Xarelto use.  Patient is interested in participation in the sleep smart study for obstructive sleep apnea and appears to be at risk.  He will undergo [place] 3 overnight screening for sleep apnea after his son's consent form.  Greater than 50% time during this 25-minute visit was spent on counseling and  coordination of care and discussion with care team.  Discussed with Dr. Creed Copper, MD Medical Director [facility] Stroke [facility] Pager: [phone] 12/03/2020 1:33 PM To contact Stroke Continuity provider, please refer to [email or web address]. After hours, contact General Neurology

## 2020-12-03 NOTE — Progress Notes (Addendum)
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 This was initially noted on 11/25/2020. She subsequently saw the patient in personon 11/27/2020 and noted the patient to continue to have slurred speech. Apparently she noted some leg weakness and drooping on his face. Unfortunately, she did not remember which side. She stated that the patient's voice was "dragging". The patient himself felt that his speech was normal. He denied any new focal extremity weakness, visual disturbance, dysphasia, dysarthria, headache.The patient endorses compliance with his rivaroxaban and Lipitor. Notably, the patient was admitted to the hospital from 09/07/2020 to 09/11/2020 during which time he had an acute embolic stroke. MRI of the brain at that time showed multiple supratentorial and infratentorial acute and subacute infarcts in multiple vascular territories. It was felt that this was secondary to poor compliance with his anticoagulation. He had been off of his anticoagulation for about 4 months at that time. During that hospitalization, the patient was restarted on his rivaroxaban. He was seen by hematology who felt that his hypercoagulable state was secondary to his congenital interruption of his IVC and that further testing was not required at that time. As stated above, the patient now states that he has been compliant with his anticoagulation without missing any doses. CT of the brain in the emergency department showed acute/early subacute infarcts of his anterior right MCA territory. Neurology was  consulted, and the patient is being transferred to [facility] secondary to no neurology coverageat [facility]   Subjective:  Patient feels well.  We discussed the conundrum of why he continues to stroke despite being on Xarelto.  We discussed the need for him to follow-up with outpatient hematology.  He states he will do so.  Also discussed the importance of timing of taking Xarelto and taking it on a full stomach.  Patient states he understands  Objective: Vitals:   12/03/20 0759 12/03/20 0958 12/03/20 1132 12/03/20 1547  BP: 136/88 (!) 156/88 (!) 147/91 134/79  Pulse: (!) 50  (!) 49 (!) 55  Resp: _0 Temp: (!) 97.5 F (36.4 C)  98.3 F (36.8 C) 98.7 F (37.1 C)  TempSrc: Oral  Oral   SpO2: 100%  100% 100%  Weight:      Height:        Intake/Output Summary (Last 24 hours) at 12/03/2020 1622 Last data filed at 12/02/2020 2315 Gross per 24 hour  Intake --  Output 250 ml  Net -250 ml   Filed Weights   11/28/20 0607 12/01/20 0358  Weight: 119.3 kg 115.9 kg     Exam:  General: Well-appearing man lying in bed in no apparent distress speaking in full sentences without any dysarthria Eyes: sclera anicteric, conjuctiva mild injection bilaterally CVS: S1-S2, regular  Respiratory:  decreased air entry bilaterally secondary to decreased inspiratory effort, rales at bases  GI: NABS, soft, NT  LE: No edema.  Neuro: Documentation from note by neurology earlier today. Psych: patient is logical and coherent, judgement and insight appear normal, mood and affect appropriate to situation.   Assessment & Plan:   60 year old gentleman with known hypercoagulable  state with history of DVT/PE on Xarelto presents with second stroke while on anticoagulation.  Patient underwent extensive work-up which is is unrevealing in terms of etiology of ongoing strokes.  Plan is for discharge home tomorrow with heme-onc outpatient follow-up after sleep study scheduled for tonight--NB.  Patient agreed  to stay for tonight however if sleep study is positive he would have to stay for second night to titrate the CPAP.  Received a phone call from Dr. Sethi that the patient declines to stay for second night.  Would like to discharge patient home tonight however [facility] pharmacy is closed and we do not have access to his Eliquis.  Given significant increased risk of stroke, will keep patient overnight so he can transition from Xarelto to Eliquis under pharmacy supervision and so that he can have adequate Eliquis upon discharge.    Acute ischemic stroke Unclear why patient continues to have strokes despite being on anticoagulation. Extensive work-up is as below. Plan per neurology is to switch from Xarelto to Eliquis as this will allow him a little bit more leeway regards to timing of medications. He will need to follow-up with hematology to see if any further recommendations are available regarding his hypercoagulable state. Factor V Leiden and anticardiolipin antibodies are pending. Patient will need follow-up with heme-onc after discharge. Patient is met with pharmacy to discuss changing Xarelto to Eliquis. Patient can be discharged home tomorrow after sleep study tonight. Patient is clinically back to baseline  Copied from PA Heard's note: R MCA Stroke  His stroke work up is complete at this time(view imaging studies + labs below). The etiology of his stroke is cryptogenic. Potential etiologies include a hypercoagulable state + cardioembolic source.   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 Cardioembolic cause is also of consideration given strokes in the anterior and posterior circulation; both cardiac event monitor + TEE done in October of 2021 were negative for atrial fibrillation/LV thrombus respectively.  This admission, given delayed bubbles noted on TCD  indicative of a possible pulmonary shunt, CT Chest was done to evaluate for pulmonary AVF that could cause his stroke- imaging was negative for pulmonary AVF. Factor V Leiden + Anticardiolipin antibodies were ordered this admission as a part of hypercoag work up and are pending.     Continue Eliquis + Atorvastatin 40 for secondary stroke prevention   CT Head: No acute abnormality.   CTA Neck W WO Contrast: No significant stenosis in the neck  MRI Brain WO Contrast: Moderate size right MCA stroke, small acute infarcts of the cerebellum bilaterally, small chronic infarcts and mild chronic microvascular ischemic changes.   MRA Head WO Contrast: Occlusion of the right MCA branch just beyond its origin from the distal M1 MCA  Carotid Doppler w/Bubbles: Positive bubble study with delayed HITS indicative of small intrapulmonary shunt   Echocardiogram limited: pertinent for mild distal inferolateral hypokinesis, wall motion relatively unchanged compared to prior images from 10/21. LVEF 55 % ( view entire report above)   Stroke labs: LDL 45, HgbA1c 6.2  Follow up Factor V Leiden + Anticardiolipin antibodies in outpatient stroke clinic. Referral placed for patient to follow up with stroke MD   Dyslipidemia Continue atorvastatin  HTN We will restart amlodipine per neuro recommendations  Known hypercoagulable state Continue Xarelto   DVT prophylaxis: On Xarelto Code Status: Full Family Communication: Patient's partner was on the phone throughout   Disposition Plan:   Patient is from: Home  Anticipated Discharge Location: Home  Barriers to Discharge: None  Is patient medically stable for Discharge: For discharge in the morning   Consultants:  Neurology  Procedures:  As listed above  Antimicrobials:  None   Data Reviewed:  Basic Metabolic Panel: Recent Labs  Lab 11/28/20 0145 11/29/20 0654 12/03/20 0613  NA 136 137 138  K 3.8 3.7 3.5  CL 101 100 103  CO2 _0 GLUCOSE 104* 95 93  BUN _1 CREATININE 1.76* 1.59* 1.72*  CALCIUM 9.5 9.5 9.6  MG 2.3  --   --   PHOS 3.3  --   --    Liver Function Tests: Recent Labs  Lab 11/28/20 0145 11/29/20 0654  AST 24 23  ALT 25 24  ALKPHOS 79 76  BILITOT 1.1 1.6*  PROT 8.0 7.5  ALBUMIN 4.4 4.1   No results for input(s): LIPASE, AMYLASE in the last 168 hours. No results for input(s): AMMONIA in the last 168 hours. CBC: Recent Labs  Lab 11/28/20 0145 11/29/20 0654  WBC 6.2 6.3  NEUTROABS 3.6  --   HGB 14.0 14.8  HCT 41.3 43.2  MCV 87.5 87.3  PLT 194 180   Cardiac Enzymes: No results for input(s): CKTOTAL, CKMB, CKMBINDEX, TROPONINI in the last 168 hours. BNP (last 3 results) No results for input(s): PROBNP in the last 8760 hours. CBG: No results for input(s): GLUCAP in the last 168 hours.  Recent Results (from the past 240 hour(s))  SARS CORONAVIRUS 2 (TAT 6-24 HRS) Nasopharyngeal Nasopharyngeal Swab     Status: None   Collection Time: 11/28/20  3:29 AM   Specimen: Nasopharyngeal Swab  Result Value Ref Range Status   SARS Coronavirus 2 NEGATIVE NEGATIVE Final    Comment: (NOTE) SARS-CoV-2 target nucleic acids are NOT DETECTED.  The SARS-CoV-2 RNA is generally detectable in upper and lower respiratory specimens during the acute phase of infection. Negative results do not preclude SARS-CoV-2 infection, do not rule out co-infections with other pathogens, and should not be used as the sole basis for treatment or other patient management decisions. Negative results must be combined with clinical observations, patient history, and epidemiological information. The expected result is Negative.  Fact Sheet for Patients: SugarRoll.be  Fact Sheet for Healthcare Providers: https://www.woods-mathews.com/  This test is not yet approved or cleared by the [place] FDA and  has been authorized for detection and/or diagnosis of SARS-CoV-2  by FDA under an Emergency Use Authorization (EUA). This EUA will remain  in effect (meaning this test can be used) for the duration of the COVID-19 declaration under Se ction 564(b)(1) of the Act, 21 U.S.C. section 360bbb-3(b)(1), unless the authorization is terminated or revoked sooner.  Performed at [facility], [place] [address], [place], [place] [address]       Studies: CT ANGIO CHEST PE W OR WO CONTRAST  Result Date: 12/03/2020 CLINICAL DATA:  Acute right-sided MCA territory cerebral infarction and history of prior lower extremity DVT. EXAM: CT ANGIOGRAPHY CHEST WITH CONTRAST TECHNIQUE: Multidetector CT imaging of the chest was performed using the standard protocol during bolus administration of intravenous contrast. Multiplanar CT image reconstructions and MIPs were obtained to evaluate the vascular anatomy. CONTRAST:  63m OMNIPAQUE IOHEXOL 350 MG/ML SOLN COMPARISON:  Prior CTA of the chest on 03/19/2011 FINDINGS: Cardiovascular: The contrast opacification is well timed for pulmonary arterial and right heart evaluation. Pulmonary arteries are normal in  caliber. There is no evidence of pulmonary AV malformation or AV fistula. The heart size is normal. Right-sided chambers are opacified and there is no evidence of visible intracardiac shunting. No pericardial fluid identified. The thoracic aorta is normal in caliber. 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 Mediastinum/Nodes: No enlarged mediastinal, hilar, or axillary lymph nodes. Thyroid gland, trachea, and esophagus demonstrate no significant findings. Lungs/Pleura: There is no evidence of pulmonary edema, consolidation, pneumothorax, nodule or pleural fluid. Upper Abdomen: No acute abnormality. Musculoskeletal: No chest wall abnormality. No acute or significant osseous findings. Review of the MIP images confirms the above findings. IMPRESSION: 1. No evidence  of pulmonary embolism, pulmonary AV malformation or pulmonary AV fistula. 2. No evidence of right to left intracardiac shunting by CTA. 3. 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 Electronically Signed   By: Aletta Edouard M.D.   On: 12/03/2020 11:14   VAS [place] TRANSCRANIAL DOPPLER W BUBBLES  Result Date: 12/03/2020  Transcranial Doppler with Bubble Indications: Stroke. Comparison Study: no prior Performing Technologist: Abram Sander RVS  Examination Guidelines: A complete evaluation includes B-mode imaging, spectral Doppler, color Doppler, and power Doppler as needed of all accessible portions of each vessel. Bilateral testing is considered an integral part of a complete examination. Limited examinations for reoccurring indications may be performed as noted.  Summary:  A vascular evaluation was performed. The right middle cerebral artery was studied. An IV was inserted into the patient's right forearm . Verbal informed consent was obtained.  HITS at rest: none HITS during valsalva: small- spencer grade 1 Positive TCD Bubble study with delayed HITS noted after valasava indicative of a small intrapulmonary shunt likely *See table(s) above for TCD measurements and observations.  Diagnosing physician: Antony Contras MD Electronically signed by Antony Contras MD on 12/03/2020 at 11:17:19 AM.    Final      Scheduled Meds: . amLODipine  5 mg Oral Daily  . apixaban  5 mg Oral BID  . atorvastatin  40 mg Oral Daily   Continuous Infusions:  Principal Problem:   Acute CVA (cerebrovascular accident) ([place]) Active Problems:   DVT (deep venous thrombosis) (HCC)   Pulmonary embolism (HCC)   CKD (chronic kidney disease) stage 3, GFR 30-59 ml/min (HCC)   Nicotine dependence   History of hypertension   H/O medication noncompliance   Acute ischemic stroke [facility])   Essential hypertension   Mixed hyperlipidemia     Alecxander Mainwaring Derek Jack, [facility]  If 7PM-7AM, please contact night-coverage [email or web address] Password [email or web address] 12/03/2020, 4:22 PM    LOS: 5 days

## 2020-12-03 NOTE — Progress Notes (Signed)
Occupational Therapy Treatment Patient Details Name: Matthew Wise MRN: [id number] DOB: 09-18-1961 Today's Date: 12/03/2020    History of present illness Matthew Wise is a 60 y.o. male [place] significant for CVA without residual deficit, hypertension, bilateral lower extremity DVT and PE in setting of hypercoagulable disorder (on Xarelto), CKD 3B who presents to the emergency department due to  2-day onset of slurred speech, facial droop, dragging his leg.  CT of the brain in the ED showed acute/early subacute infarcts of his anterior right MCA territory. MRI revealed: Moderate size acute right MCA territory infarction without hemorrhagic transformation or significant mass effect, small acute infarcts of the cerebellum bilaterally, small chronic infarcts and mild chronic microvascular ischemic changes. Occlusion of right MCA branch just beyond its origin from the distal M1 MCA.   OT comments  Pt making good progress towards OT goals this session. Session focus on BADL reeducation, functional mobility and functional transfer training. Pt appears to be nearing baseline level of function. Pt completing oral care at sink upon OTA arrival independently, pt performed toileting independently and completed UB/ LB dressing from EOB with supervision. Pt completed functional mobility with no AD with gross supervision. Pt completed tub shower transfer with min guard, education provided on appropriate shower seat for safety and issued pt handout of available options, pt verbalized understanding. Pt reports his sisters can assist pt initially with IADL tasks. Pt continues to be flat during session demonstrating some safety concerns ( see cog section). Pt would continue to benefit from skilled occupational therapy while admitted and after d/c to address the below listed limitations in order to improve overall functional mobility and facilitate independence with BADL participation. DC plan remains appropriate, will  follow acutely per POC.     Follow Up Recommendations  Supervision/Assistance - 24 hour;Other (comment) (initially)    Equipment Recommendations  Tub/shower seat    Recommendations for Other Services      Precautions / Restrictions Precautions Precautions: None Restrictions Weight Bearing Restrictions: No       Mobility Bed Mobility Overal bed mobility: Modified Independent             General bed mobility comments: no physical assist needed to return to flat [facility]  Transfers Overall transfer level: Modified independent Equipment used: None Transfers: Sit to/from Stand Sit to Stand: Modified independent (Device/Increase time)         General transfer comment: increased time to rise d/t back pain    Balance Overall balance assessment: No apparent balance deficits (not formally assessed) (pt able to complete dynamic balance tasks of reaching ADL items from floor with no LOB)                                         ADL either performed or assessed with clinical judgement   ADL Overall ADL's : At baseline                                       General ADL Comments: pt able to complete sink level grooming and did leave sink running again as noted in previous OT session needing cues to attend to safety concern, pt completed toileting independendently and required set- up assist for UB dressing and supervision for LB dressing, pt completed tub transfer with supervision, education provided  on appropriate DME for showering with pt verbalizing understanding     Vision Baseline Vision/History: No visual deficits Patient Visual Report: No change from baseline Vision Assessment?: No apparent visual deficits   Perception     Praxis      Cognition Arousal/Alertness: Awake/alert Behavior During Therapy: Flat affect Overall Cognitive Status: No family/caregiver present to determine baseline cognitive functioning                                  General Comments: pt very flat during session, pt following commands but noted to present with some safety concerns such as leaving sink on after completing grooming        Exercises Other Exercises Other Exercises: pt able to complete x3 steps with railing on R side with min guard and no LOB to simulate functional mobility in home enviroment   Shoulder Instructions       General Comments issued pt handout of availble DME for shower seats, pt reports his sisters can assist with IADLs at first    Pertinent Vitals/ Pain       Pain Assessment: Faces Faces Pain Scale: Hurts a little bit Pain Location: back from laying in bed Pain Descriptors / Indicators: Sore Pain Intervention(s): Monitored during session;Repositioned  Home Living                                          Prior Functioning/Environment              Frequency  Min 2X/week        Progress Toward Goals  OT Goals(current goals can now be found in the care plan section)  Progress towards OT goals: Progressing toward goals  Acute Rehab OT Goals Patient Stated Goal: return home with family to assist OT Goal Formulation: With patient Time For Goal Achievement: 12/11/20 Potential to Achieve Goals: Good  Plan Discharge plan remains appropriate;Frequency remains appropriate    Co-evaluation                 AM-PAC OT "6 Clicks" Daily Activity     Outcome Measure   Help from another person eating meals?: None Help from another person taking care of personal grooming?: None Help from another person toileting, which includes using toliet, bedpan, or urinal?: None Help from another person bathing (including washing, rinsing, drying)?: None Help from another person to put on and taking off regular upper body clothing?: None Help from another person to put on and taking off regular lower body clothing?: None 6 Click Score: 24    End of Session    OT Visit  Diagnosis: Muscle weakness (generalized) (M62.81);Hemiplegia and hemiparesis Hemiplegia - Right/Left: Left Hemiplegia - dominant/non-dominant: Non-Dominant Hemiplegia - caused by: Cerebral infarction   Activity Tolerance Patient tolerated treatment well   Patient Left in bed;with call bell/phone within reach;with nursing/sitter in room   Nurse Communication Mobility status        Time: 1610-9604 OT Time Calculation (min): 26 min  Charges: OT General Charges $OT Visit: 1 Visit OT Treatments $Self Care/Home Management : 23-37 mins  Lenor Derrick., COTA/L Acute Rehabilitation Services [phone]    Barron Schmid 12/03/2020, 9:26 AM

## 2020-12-04 DIAGNOSIS — N1832 Chronic kidney disease, stage 3b: Secondary | ICD-10-CM | POA: Diagnosis not present

## 2020-12-04 DIAGNOSIS — I639 Cerebral infarction, unspecified: Secondary | ICD-10-CM | POA: Diagnosis not present

## 2020-12-04 LAB — CARDIOLIPIN ANTIBODIES, IGM+IGG
Anticardiolipin IgG: 14 GPL U/mL (ref 0–14)
Anticardiolipin IgM: <9 MPL U/mL (ref 0–12)

## 2020-12-04 LAB — CARDIOLIPIN ANTIBODIES, IGG, IGM, IGA
Anticardiolipin IgA: <9 APL U/mL (ref 0–11)
Anticardiolipin IgG: 17 GPL U/mL — ABNORMAL HIGH (ref 0–14)
Anticardiolipin IgM: <9 MPL U/mL (ref 0–12)

## 2020-12-04 LAB — FACTOR 5 LEIDEN

## 2020-12-04 NOTE — Progress Notes (Signed)
Matthew Wise  D/C'd Home per MD order.  Discussed with the patient and all questions fully answered.  VSS, Skin clean, dry and intact without evidence of skin break down, no evidence of skin tears noted. IV catheter discontinued intact. Site without signs and symptoms of complications. Dressing and pressure applied.  An After Visit Summary was printed and given to the patient. Patient received eliquis prescription.  D/c education completed with patient including follow up instructions, medication list, d/c activities limitations if indicated, with other d/c instructions as indicated by MD - patient able to verbalize understanding, all questions fully answered.   Patient instructed to return to ED, call 911, or call MD for any changes in condition.   Patient escorted via WC, and D/C home via private auto at 1430.  [name] 12/04/2020 2:46 PM

## 2020-12-04 NOTE — Progress Notes (Addendum)
STROKE TEAM PROGRESS NOTE   INTERVAL HISTORY Patient laying in bed comfortably. He is ready to discharge home. All discharge questions were answered.   OBJECTIVE   Vitals:   12/03/20 2002 12/03/20 2325 12/04/20 0300 12/04/20 0752  BP: (!) 141/94 119/67 129/72 134/76  Pulse: (!) 51 (!) 53 (!) 59 (!) 48  Resp: 18 18 18 18   Temp: 98.3 F (36.8 C) 98.5 F (36.9 C) 98.3 F (36.8 C) 97.8 F (36.6 C)  TempSrc: Oral Oral Oral Oral  SpO2: 100% 99% 98% 100%  Weight:      Height:       Lipid Panel:  Recent Labs  Lab 11/28/20 0145  CHOL 148  TRIG 159*  HDL 71  CHOLHDL 2.1  VLDL 32  LDLCALC 45   HgbA1c:  Recent Labs  Lab 11/28/20 0145  HGBA1C 6.2*   Urine Drug Screen:  Recent Labs  Lab 11/28/20 0121  LABOPIA NONE DETECTED  COCAINSCRNUR NONE DETECTED  LABBENZ NONE DETECTED  AMPHETMU NONE DETECTED  THCU POSITIVE*  LABBARB NONE DETECTED    Pertinent Imaging   11/28/20 MRI Brain WO Contrast  Moderate size acute right MCA territory infarction without hemorrhagic transformation or significant mass effect. Small acute infarcts of the cerebellum bilaterally. Small chronic infarcts and mild chronic microvascular ischemic changes. Occlusion of right MCA branch just beyond its origin from the distal M1 MCA.  11/28/20 MRA Angio Head WO Contrast  Intracranial internal carotid arteries are patent. Right M1 MCA is patent. There is occlusion of a right MCA branch just beyond its origin from the distal M1 segment. Left middle and both anterior cerebral arteries are patent. Intracranial vertebral arteries and basilar artery are not well seen due to small caliber but are patent on prior CTA neck. Posterior cerebral arteries are patent. Bilateral posterior communicating arteries are present with fetal origin of the posterior cerebral arteries.  11/28/20 CT Angio Neck W WO Contrast  1. Stable cervical carotid and vertebral arteries since October CTA. No significant stenosis in the  neck. 2. Intracranial CTA was not ordered or performed, and I am arranging for the [facility] MRI dept to add on intracranial MRA at this time to re-evaluate the Right MCA.  11/28/20 CT Head WO Contrast  Acute/early subacute infarct of the anterior right MCA territory. No hemorrhage or mass effect.  11/28/20 Echo Limited 1. Mild distal inferiolateral hypokinesis. IN comparison to images from  October 2021, wall motion is relatively unchanged. . Left ventricular  ejection fraction, by estimation, is 55%. There is moderate left  ventricular hypertrophy.  2. Right ventricular systolic function is normal. The right ventricular size is normal.  3. Left atrial size was mildly dilated.  4. The mitral valve is abnormal. Mild mitral valve regurgitation.  5. The aortic valve is abnormal. Aortic valve regurgitation is mild. Mild aortic valve sclerosis is present, with no evidence of aortic valve stenosis.  6. The inferior vena cava is normal in size with greater than 50% respiratory variability, suggesting right atrial pressure of 3 mmHg.   12/03/19 VAS [place] Transcranial Doppler with Bubbles: Positive TCD Bubble study with delayed HITS noted after valasava indicative of a small intrapulmonary shunt  12/04/19 CT Angio Chest PE W or WO Contrast  1. No evidence of pulmonary embolism, pulmonary AV malformation or pulmonary AV fistula. 2. No evidence of right to left intracardiac shunting by CTA. 3. 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  PHYSICAL EXAM Pleasant obese middle-age African-American male not in distress. . Afebrile. Head is nontraumatic. Neck is supple without bruit.    Cardiac exam no murmur or gallop. Lungs are clear to auscultation. Distal pulses are well felt. MS: AAOx4, follows commands  Speech: Fluent with mildly dysarthric speech  CN: EOMI, VFF, Left facial droop, Tongue midline, Shoulder shrug intact  Motor: 5/5  throughout  Sensation: Intact to light tough throughout  Coordination: No ataxia FNF  Gait: Deferred  NIHSS 1 ( Left facial droop)  Premorbid MRS 0  ASSESSMENT/PLAN Matthew Wise is a 60 y.o. male w/pmh of HTN, Stroke (09/09/20, with no residual deficits), BLE DVT (on Xarelto) and PE (hypercoagulable), CKD who presents with left facial droop, slurred speech and left leg weakness- sx onset 2 days prior to admission. He was found to have a R MCA Stroke. Of note, he was hospitalized in October of 2021 with multiple small infarcts bilaterally. At that time it was believed he had a stroke due to poor adherence to [facility]- had not taken Xarelto due to financial constraints for 4-6 months prior to admission.   R MCA Stroke  His stroke work up is complete at this time(view imaging studies + labs below). The etiology of his stroke is cryptogenic. Potential etiologies include a hypercoagulable state + cardioembolic source.   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 Cardioembolic cause is also of consideration given strokes in the anterior and posterior circulation; both cardiac event monitor + TEE done in October of 2021 were negative for atrial fibrillation/LV thrombus respectively.  This admission, given delayed bubbles noted on TCD indicative of a possible pulmonary shunt, CT Chest was done to evaluate for pulmonary AVF that could cause his stroke- imaging was negative for pulmonary AVF. Factor V Leiden + Anticardiolipin antibodies were ordered this admission as a part of hypercoag work up and are pending.   Of note, the patient reports compliance with his Xarelto, though he does note taking it on an empty stomach which can impact absorption. Given he had a new stroke while taking Xarelto his anticoagulation was transitioned to Eliquis this admission after discussing this  with the patient.   Continue Eliquis + Atorvastatin 40 for secondary stroke prevention   CT Head: No acute abnormality.   CTA Neck W WO Contrast: No significant stenosis in the neck  MRI Brain WO Contrast: Moderate size right MCA stroke, small acute infarcts of the cerebellum bilaterally, small chronic infarcts and mild chronic microvascular ischemic changes.   MRA Head WO Contrast: Occlusion of the right MCA branch just beyond its origin from the distal M1 MCA  Carotid Doppler w/Bubbles: Positive bubble study with delayed HITS indicative of small intrapulmonary shunt   Echocardiogram limited: pertinent for mild distal inferolateral hypokinesis, wall motion relatively unchanged compared to prior images from 10/21. LVEF 55 % ( view entire report above)   Stroke labs: LDL 45, HgbA1c 6.2  Follow up Factor V Leiden + Anticardiolipin antibodies in outpatient stroke clinic. Referral placed for patient to follow up with stroke MD  Hypertension  He has a history of hypertension and takes Amlodipine 5 mg at home. Originally this was held to allow for permissive HTN and was resumed on 12/04/19. Current SBP goal is < 180 and long term SBP goal is normotensive. Continue Amlodipine 5 mg at discharge   Hyperlipidemia  He has a history of HLD and was taking Atorvastatin 40 mg at home- this was resumed this admission. His LDL is at goal from a secondary stroke prevention standpoint.   Prediabetes   HgbA1c 6.2, goal < 7.0. No intervention needed, he can follow up with his PCP for prediabetes at discharge and at their discretion Metformin can be initiated.   Hypercoagulable State  He has a hypercoagulable state in the setting of congenital inferior vena cava interruption. Onc has recommended lifelong AC. Recommend he follow up with Oncology at discharge given he has not seen them since 2018 and his anticoagulation was transitioned to Eliquis this admission.   Other Stroke Risk Factors  Cigarette  smoker, advised to stop smoking  Obesity, Body mass index is 34.65 kg/m., BMI >/= 30 associated with increased stroke risk, recommend weight loss, diet and exercise as appropriate   Hx stroke/TIA  Obstructive sleep apnea, not officially diagnosed in the past, was enrolled in a sleep study this admission. Initially the patient wanted to participate in the study but ultimately refused stating that he wanted to be worked up for OSA on an outpatient basis with his PCP.   Hospital day # 6  Ruta Hinds, NP  [facility] Nurse Practitioner  Patient seen and discussed with attending physician Dr. Leonie Man   Neurology will sign off at this time given stroke work up is complete.  Patient was interested in participating the sleep smart study but was not willing to stay a second night hence we did not screen him for the study.  Can be discharged home today and follow-up as an outpatient stroke clinic in 6 weeks.  Discussed with Dr. Sarajane Jews.    Antony Contras, MD Medical Director [facility] Pager: [phone] 12/04/2020 11:06 AM To contact Stroke Continuity provider, please refer to [email or web address] After hours, contact General Neurology

## 2020-12-04 NOTE — Plan of Care (Signed)
  Problem: Coping: Goal: Will verbalize positive feelings about self Outcome: Progressing   Problem: Elimination: Goal: Will not experience complications related to bowel motility Outcome: Progressing Goal: Will not experience complications related to urinary retention Outcome: Progressing   Problem: Skin Integrity: Goal: Risk for impaired skin integrity will decrease Outcome: Progressing

## 2020-12-04 NOTE — Hospital Course (Signed)
60 year old man PMH stroke, bilateral lower extremity DVT/PE in setting of hypercoagulability disorder, on Xarelto as an outpatient, CKD stage IIIb presented with slurred speech, facial droop, leg weakness.  Previously admitted in October for multiple small infarcts without weakness thought secondary to noncompliance with Xarelto.  Admitted for acute CVA.,  Transferred from [facility] to [facility] for neurology evaluation.  Acute ischemic right MCA stroke despite being on anticoagulation, with left facial droop, dysarthria.  Etiology cryptogenic, potential etiologies include hypercoagulability state, cardioembolic source.  PMH stroke October 2021 with no residual deficits. Patient was not a candidate for TPA d/t presenting outside of the window and Xarelto usage. Not a candidate for IR d/t outside timeframe.  --TCD bubble study performed showed small delayed right to left shunt likely indicative of small intrapulmonary shunt thought to be clinically insignificant --stable echocardiogram --Continue atorvastatin.  Given stroke while on Xarelto, neurology transitioned the patient to apixaban --Outpatient OT, HH PT --Follow-up with neurology as an outpatient --Follow-up with PCP, hematology as an outpatient given hypercoagulability state and outstanding labs.  Follow-up factor V Leiden and anticardiolipin antibodies as an outpatient.  Essential hypertension --Stable. Continue antihypertensives  Prediabetes hemoglobin A1c 6.2 --stable. Outpatient follow-up  CKD stage IIIa --stable  Marijuana use --Recommend cessation  PMH DVT, PE, hypercoagulable disorder, congenital interruption of IVC --apixaban, plan as above  PMH nocturnal bradycardia thought not to be clinically significant

## 2020-12-04 NOTE — TOC Transition Note (Signed)
Transition of Care [facility]) - CM/SW Discharge Note   Patient Details  Name: Matthew Wise MRN: [id number] Date of Birth: [birth year]  Transition of Care [facility]) CM/SW Contact:  Kermit Balo, RN Phone Number: 12/04/2020, 11:27 AM   Clinical Narrative:    Pt discharging home with [facility] services through [place]. Cory with [facility] aware of d/c home today.  Pt will purchase a shower seat outside of the hospital. Eliquis provided to pt per [facility] pharmacy.  Pt has transportation home.    Final next level of care: Home w Home Health Services Barriers to Discharge: No Barriers Identified   Patient Goals and CMS Choice Patient states their goals for this hospitalization and ongoing recovery are:: go home CMS Medicare.gov Compare Post Acute Care list provided to:: Patient (discussed via phone) Choice offered to / list presented to : Patient  Discharge Placement                       Discharge Plan and Services In-house Referral: Interpreting Services Discharge Planning Services: CM Consult Post Acute Care Choice: Durable Medical Equipment          DME Arranged: Shower stool DME Agency: [employer] Date DME Agency Contacted: 12/02/20 Time DME Agency Contacted: 1028 Representative spoke with at DME Agency: Velna Hatchet HH Arranged: PT HH Agency: [facility] Date [facility] Agency Contacted: 12/02/20 Time HH Agency Contacted: 1026 Representative spoke with at [facility] Agency: Lorenza Chick  Social Determinants of Health (SDOH) Interventions     Readmission Risk Interventions No flowsheet data found.

## 2020-12-04 NOTE — Discharge Summary (Signed)
Physician Discharge Summary  Matthew Wise [id number] DOB: 1961/02/24 DOA: 11/28/2020  PCP: Doree Albee, MD  Admit date: 11/28/2020 Discharge date: 12/04/2020  Recommendations for Outpatient Follow-up:   Acute ischemic right MCA stroke --Follow-up with neurology as an outpatient, consider outpt sleep study --Follow-up with PCP, hematology as an outpatient given hypercoagulability state and outstanding labs.  Follow-up factor V Leiden and anticardiolipin antibodies as an outpatient.  Prediabetes hemoglobin A1c 6.2 --stable. Outpatient follow-up  Marijuana use --Recommend cessation  PMH DVT, PE, hypercoagulable disorder, congenital interruption of IVC --apixaban, plan as above   Follow-up Information    [facility] Follow up.   Specialty: [place] Why: A representative from [facility] will contact you to arrange start date and time for your therapy  Contact information: [place] [address] [phone]        [facility] Follow up.   Why: A repressentative from [employer] will deliver a shower stool to your room prior to your discharge Contact information: [address] [place] [address] [phone]        [facility]. Schedule an appointment as soon as possible for a visit in 2 week(s).        Doree Albee, MD. Schedule an appointment as soon as possible for a visit in 2 week(s).   Specialty: Internal Medicine Contact information: [address] [place] [address] [phone]                Discharge Diagnoses: Principal diagnosis is #1 Principal Problem:   Acute CVA (cerebrovascular accident) [facility]) Active Problems:   DVT (deep venous thrombosis) (HCC)   Pulmonary embolism (HCC)   CKD (chronic kidney disease) stage 3, GFR 30-59 ml/min (HCC)   Nicotine dependence   History of hypertension   H/O medication noncompliance   Acute ischemic stroke  ([place])   Essential hypertension   Mixed hyperlipidemia   Discharge Condition: improved Disposition: home w/ HHPT, OT  Diet recommendation:  Diet Orders (From admission, onward)    Start     Ordered   12/04/20 0000  Diet - low sodium heart healthy        12/04/20 1114   12/04/20 0000  Diet Carb Modified        12/04/20 1114   11/28/20 1021  Diet Heart Room service appropriate? Yes; Fluid consistency: Thin  Diet effective now       Question Answer Comment  Room service appropriate? Yes   Fluid consistency: Thin      11/28/20 1020           Filed Weights   11/28/20 0607 12/01/20 0358  Weight: 119.3 kg 115.9 kg    HPI/Hospital Course:   60 year old man PMH stroke, bilateral lower extremity DVT/PE in setting of hypercoagulability disorder, on Xarelto as an outpatient, CKD stage IIIb presented with slurred speech, facial droop, leg weakness.  Previously admitted in October for multiple small infarcts without weakness thought secondary to noncompliance with Xarelto.  Admitted for acute CVA.,  [facility] evaluation.  Seen by neurology, medications adjusted as below, sleep study recommended but the patient refused.  This can be pursued in the outpatient setting.  Individual issues as below.  Acute ischemic right MCA stroke despite being on anticoagulation, with left facial droop, dysarthria.  Etiology cryptogenic, potential etiologies include hypercoagulability state, cardioembolic source.  PMH stroke October 2021 with no residual deficits.  Patient was not a candidate for TPA d/t presenting outside of the window and Xarelto usage. Not a candidate for IR d/t outside timeframe.  --TCD bubble study performed showed small delayed right to left shunt likely indicative of small intrapulmonary shunt thought to be clinically insignificant --stable echocardiogram --Continue atorvastatin.  Given stroke while on Xarelto, neurology transitioned the patient  to apixaban --Outpatient OT, HH PT --Follow-up with neurology as an outpatient, consider outpt sleep study --Follow-up with PCP, hematology as an outpatient given hypercoagulability state and outstanding labs.  Follow-up factor V Leiden and anticardiolipin antibodies as an outpatient.  Essential hypertension --Stable. Continue antihypertensives  Prediabetes hemoglobin A1c 6.2 --stable. Outpatient follow-up  CKD stage IIIa --stable  Marijuana use --Recommend cessation  PMH DVT, PE, hypercoagulable disorder, congenital interruption of IVC --apixaban, plan as above   Consults:  . Neurology    Today's assessment: S: CC: f/u stroke  Feels fine, no complaints, speaking fine, swallowing fine.  O: Vitals:  Vitals:   12/04/20 0752 12/04/20 1117  BP: 134/76 135/76  Pulse: (!) 48 (!) 49  Resp: 18 18  Temp: 97.8 F (36.6 C) 97.6 F (36.4 C)  SpO2: 100% 100%    Constitutional:  . Appears calm and comfortable ENMT:  . grossly normal hearing  Respiratory:  . CTA bilaterally, no w/r/r.  . Respiratory effort normal.  Cardiovascular:  . RRR, no m/r/g . No LE extremity edema   Psychiatric:  . judgement and insight appear normal . Mental status o Mood, affect appropriate  No new data today  Discharge Instructions  Discharge Instructions    Ambulatory referral to Neurology   Complete by: As directed    An appointment is requested in approximately 6 weeks with Dr. Leonie Man in stroke clinic. Patient hospitalized with a R MCA stroke.   Diet - low sodium heart healthy   Complete by: As directed    Diet Carb Modified   Complete by: As directed    Discharge instructions   Complete by: As directed    Call your physician or seek immediate medical attention for weakness, numbness, pain, falls or worsening of condition. Recommend stopping marijuana use.   Increase activity slowly   Complete by: As directed      Allergies as of 12/04/2020   No Known Allergies     Medication  List    STOP taking these medications   rivaroxaban 20 MG Tabs tablet Commonly known as: XARELTO     TAKE these medications   amLODipine 5 MG tablet Commonly known as: NORVASC Take 1 tablet (5 mg total) by mouth daily.   apixaban 5 MG Tabs tablet Commonly known as: ELIQUIS Take 1 tablet (5 mg total) by mouth 2 (two) times daily.   atorvastatin 40 MG tablet Commonly known as: LIPITOR Take 1 tablet (40 mg total) by mouth daily.   fexofenadine 180 MG tablet Commonly known as: ALLEGRA Take 180 mg by mouth every other day.   fluticasone 50 MCG/ACT nasal spray Commonly known as: FLONASE Place into both nostrils daily.      No Known Allergies  The results of significant diagnostics from this hospitalization (including imaging, microbiology, ancillary and laboratory) are listed below for reference.    Significant Diagnostic Studies: CT HEAD WO CONTRAST  Result Date: 11/28/2020 CLINICAL DATA:  Slurred speech EXAM: CT HEAD WITHOUT CONTRAST TECHNIQUE: Contiguous axial images were obtained from the base of the skull through the vertex without intravenous contrast. COMPARISON:  None. FINDINGS: Brain: There is a  acute/early subacute infarct within the anterior right MCA territory. No hemorrhage. No midline shift or other mass effect. No hydrocephalus. Vascular: No abnormal hyperdensity of the major intracranial arteries or dural venous sinuses. No intracranial atherosclerosis. Skull: The visualized skull base, calvarium and extracranial soft tissues are normal. Sinuses/Orbits: No fluid levels or advanced mucosal thickening of the visualized paranasal sinuses. No mastoid or middle ear effusion. The orbits are normal. IMPRESSION: Acute/early subacute infarct of the anterior right MCA territory. No hemorrhage or mass effect. Electronically Signed   By: Ulyses Jarred M.D.   On: 11/28/2020 03:16   CT ANGIO NECK W OR WO CONTRAST  Result Date: 11/28/2020 CLINICAL DATA:  60 year old male with  slurred speech and subacute appearing anterior right MCA territory infarct on head CT earlier today. EXAM: CT ANGIOGRAPHY NECK TECHNIQUE: Multidetector CT imaging of the neck was performed using the standard protocol during bolus administration of intravenous contrast. Multiplanar CT image reconstructions and MIPs were obtained to evaluate the vascular anatomy. Carotid stenosis measurements (when applicable) are obtained utilizing NASCET criteria, using the distal internal carotid diameter as the denominator. CONTRAST:  73mL OMNIPAQUE IOHEXOL 350 MG/ML SOLN COMPARISON:  Plain head CT 0300 hours today. CTA head and neck 09/07/2020. FINDINGS: Skeleton: C1-C2 cervical spine degeneration. No acute osseous abnormality identified. Upper chest: Negative; dilated azygos vein similar to October. Other neck: Stable thyroid, up to 1.8 cm hypodense nodules as reported in October. Right submandibular region probable sebaceous cyst is stable. No acute finding in the neck. Negative visible posterior fossa. Aortic arch: 3 vessel arch configuration. No arch atherosclerosis today. Right carotid system: Negative right CCA. Mild soft and calcified plaque at the right ICA origin is stable since October without stenosis. Visible right ICA siphon is patent to the anterior genu. Left carotid system: Negative left CCA. Trace calcified plaque at the left ICA origin is stable without stenosis. Visible left ICA siphon is patent with some calcified plaque. Vertebral arteries: Diminutive vertebral arteries, with fetal type bilateral PCA origins demonstrated in October. Both cervical vertebral arteries are highly diminutive but remain patent to the skull base and appears stable since October. Patent V4 segments, vertebrobasilar junction, left PICA and dominant appearing right AICA. Review of the MIP images confirms the above findings IMPRESSION: 1. Stable cervical carotid and vertebral arteries since October CTA. No significant stenosis in the  neck. 2. Intracranial CTA was not ordered or performed, and I am arranging for the [facility] MRI dept to add on intracranial MRA at this time to re-evaluate the Right MCA. Electronically Signed   By: Genevie Ann M.D.   On: 11/28/2020 09:28   CT ANGIO CHEST PE W OR WO CONTRAST  Result Date: 12/03/2020 CLINICAL DATA:  Acute right-sided MCA territory cerebral infarction and history of prior lower extremity DVT. EXAM: CT ANGIOGRAPHY CHEST WITH CONTRAST TECHNIQUE: Multidetector CT imaging of the chest was performed using the standard protocol during bolus administration of intravenous contrast. Multiplanar CT image reconstructions and MIPs were obtained to evaluate the vascular anatomy. CONTRAST:  25mL OMNIPAQUE IOHEXOL 350 MG/ML SOLN COMPARISON:  Prior CTA of the chest on 03/19/2011 FINDINGS: Cardiovascular: The contrast opacification is well timed for pulmonary arterial and right heart evaluation. Pulmonary arteries are normal in caliber. There is no evidence of pulmonary AV malformation or AV fistula. The heart size is normal. Right-sided chambers are opacified and there is no evidence of visible intracardiac shunting. No pericardial fluid identified. The thoracic aorta is normal in caliber. 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 Mediastinum/Nodes: No enlarged mediastinal, hilar, or axillary lymph nodes. Thyroid gland, trachea, and esophagus demonstrate no significant findings. Lungs/Pleura: There is no evidence of pulmonary edema, consolidation, pneumothorax, nodule or pleural fluid. Upper Abdomen: No acute abnormality. Musculoskeletal: No chest wall abnormality. No acute or significant osseous findings. Review of the MIP images confirms the above findings. IMPRESSION: 1. No evidence of pulmonary embolism, pulmonary AV malformation or pulmonary AV fistula. 2. No evidence of right to left intracardiac shunting by CTA. 3. 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 Electronically Signed   By: Aletta Edouard M.D.   On: 12/03/2020 11:14   MR ANGIO HEAD WO CONTRAST  Result Date: 11/28/2020 CLINICAL DATA:  Abnormal CT with stroke EXAM: MRI HEAD WITHOUT CONTRAST MRA HEAD WITHOUT CONTRAST TECHNIQUE: Multiplanar, multiecho pulse sequences of the brain and surrounding structures were obtained without intravenous contrast. Angiographic images of the head were obtained using MRA technique without contrast. COMPARISON:  09/09/2020 FINDINGS: MRI HEAD Brain: Moderate size area of reduced diffusion is present in the right MCA territory involving the middle and inferior gyri of the frontal lobe including the operculum. No associated hemorrhage. Mass effect is minor. Small bilateral acute cerebellar infarcts are also present. Small chronic infarcts of the left caudate and bilateral cerebellum. Additional patchy foci of T2 hyperintensity in the supratentorial white matter are nonspecific probably reflect mild chronic microvascular ischemic changes. There is no intracranial mass. There is no hydrocephalus or extra-axial fluid collection. Vascular: Major vessel flow voids at the skull base are preserved. Diminutive vertebrobasilar flow voids secondary to bilateral fetal PCAs. Skull and upper cervical spine: Normal marrow signal is preserved. Sinuses/Orbits: Paranasal sinuses are aerated. Orbits are unremarkable. Other: Sella is unremarkable.  Mastoid air cells are clear. MRA HEAD Intracranial internal carotid arteries are patent. Right M1 MCA is patent. There is occlusion of a right MCA branch just beyond its origin from the distal M1 segment. Left middle and both anterior cerebral arteries are patent. Intracranial vertebral arteries and basilar artery are not well seen due to small caliber but are patent on prior CTA neck. Posterior cerebral arteries are patent. Bilateral posterior communicating  arteries are present with fetal origin of the posterior cerebral arteries. IMPRESSION: Moderate size acute right MCA territory infarction without hemorrhagic transformation or significant mass effect. Small acute infarcts of the cerebellum bilaterally. Small chronic infarcts and mild chronic microvascular ischemic changes. Occlusion of right MCA branch just beyond its origin from the distal M1 MCA. Electronically Signed   By: Macy Mis M.D.   On: 11/28/2020 10:23   MR BRAIN WO CONTRAST  Result Date: 11/28/2020 CLINICAL DATA:  Abnormal CT with stroke EXAM: MRI HEAD WITHOUT CONTRAST MRA HEAD WITHOUT CONTRAST TECHNIQUE: Multiplanar, multiecho pulse sequences of the brain and surrounding structures were obtained without intravenous contrast. Angiographic images of the head were obtained using MRA technique without contrast. COMPARISON:  09/09/2020 FINDINGS: MRI HEAD Brain: Moderate size area of reduced diffusion is present in the right MCA territory involving the middle and inferior gyri of the frontal lobe including the operculum. No associated hemorrhage. Mass effect is minor. Small bilateral acute cerebellar infarcts are also present. Small chronic infarcts of the left caudate and bilateral cerebellum. Additional patchy foci of T2 hyperintensity in the supratentorial white matter are nonspecific probably reflect mild chronic microvascular ischemic  changes. There is no intracranial mass. There is no hydrocephalus or extra-axial fluid collection. Vascular: Major vessel flow voids at the skull base are preserved. Diminutive vertebrobasilar flow voids secondary to bilateral fetal PCAs. Skull and upper cervical spine: Normal marrow signal is preserved. Sinuses/Orbits: Paranasal sinuses are aerated. Orbits are unremarkable. Other: Sella is unremarkable.  Mastoid air cells are clear. MRA HEAD Intracranial internal carotid arteries are patent. Right M1 MCA is patent. There is occlusion of a right MCA branch just  beyond its origin from the distal M1 segment. Left middle and both anterior cerebral arteries are patent. Intracranial vertebral arteries and basilar artery are not well seen due to small caliber but are patent on prior CTA neck. Posterior cerebral arteries are patent. Bilateral posterior communicating arteries are present with fetal origin of the posterior cerebral arteries. IMPRESSION: Moderate size acute right MCA territory infarction without hemorrhagic transformation or significant mass effect. Small acute infarcts of the cerebellum bilaterally. Small chronic infarcts and mild chronic microvascular ischemic changes. Occlusion of right MCA branch just beyond its origin from the distal M1 MCA. Electronically Signed   By: Macy Mis M.D.   On: 11/28/2020 10:23   VAS [place] TRANSCRANIAL DOPPLER W BUBBLES  Result Date: 12/03/2020  Transcranial Doppler with Bubble Indications: Stroke. Comparison Study: no prior Performing Technologist: Abram Sander RVS  Examination Guidelines: A complete evaluation includes B-mode imaging, spectral Doppler, color Doppler, and power Doppler as needed of all accessible portions of each vessel. Bilateral testing is considered an integral part of a complete examination. Limited examinations for reoccurring indications may be performed as noted.  Summary:  A vascular evaluation was performed. The right middle cerebral artery was studied. An IV was inserted into the patient's right forearm . Verbal informed consent was obtained.  HITS at rest: none HITS during valsalva: small- spencer grade 1 Positive TCD Bubble study with delayed HITS noted after valasava indicative of a small intrapulmonary shunt likely *See table(s) above for TCD measurements and observations.  Diagnosing physician: Antony Contras MD Electronically signed by Antony Contras MD on 12/03/2020 at 11:17:19 AM.    Final    ECHOCARDIOGRAM LIMITED  Result Date: 11/28/2020    ECHOCARDIOGRAM LIMITED REPORT   Patient Name:    KRUNAL PIRKLE Date of Exam: 11/28/2020 Medical Rec #:  [id number]       Height:       72.0 in Accession #:    [id number]      Weight:       263.0 lb Date of Birth:  Aug 16, 1961      BSA:          2.394 m Patient Age:    60 years        BP:           129/73 mmHg Patient Gender: M               HR:           56 bpm. Exam Location:  [facility] Procedure: Limited Echo Indications:    Stroke 434.91 / I163.9  History:        Patient has prior history of Echocardiogram examinations, most                 recent 09/10/2020. TIA; Risk Factors:Current Smoker and                 Hypertension. Sickle Cell Trait, Pulmonary Embolism.  Sonographer:    Leavy Cella RDCS (AE) Referring Phys:  [id number] OLADAPO ADEFESO IMPRESSIONS  1. Mild distal inferiolateral hypokinesis. IN comparison to images from October 2021, wall motion is relatively unchanged. . Left ventricular ejection fraction, by estimation, is 55%. There is moderate left ventricular hypertrophy.  2. Right ventricular systolic function is normal. The right ventricular size is normal.  3. Left atrial size was mildly dilated.  4. The mitral valve is abnormal. Mild mitral valve regurgitation.  5. The aortic valve is abnormal. Aortic valve regurgitation is mild. Mild aortic valve sclerosis is present, with no evidence of aortic valve stenosis.  6. The inferior vena cava is normal in size with greater than 50% respiratory variability, suggesting right atrial pressure of 3 mmHg. FINDINGS  Left Ventricle: Mild distal inferiolateral hypokinesis. IN comparison to images from October 2021, wall motion is relatively unchanged. Left ventricular ejection fraction, by estimation, is 55%. The left ventricular internal cavity size was normal in size. There is moderate left ventricular hypertrophy. Right Ventricle: The right ventricular size is normal. Right vetricular wall thickness was not assessed. Right ventricular systolic function is normal. Left Atrium: Left atrial size was mildly  dilated. Right Atrium: Right atrial size was normal in size. Mitral Valve: The mitral valve is abnormal. There is mild thickening of the mitral valve leaflet(s). Mild mitral annular calcification. Mild mitral valve regurgitation. Aortic Valve: The aortic valve is abnormal. Aortic valve regurgitation is mild. Mild aortic valve sclerosis is present, with no evidence of aortic valve stenosis. Pulmonic Valve: The pulmonic valve was normal in structure. Pulmonic valve regurgitation is not visualized. Aorta: The aortic root is normal in size and structure. Venous: The inferior vena cava is normal in size with greater than 50% respiratory variability, suggesting right atrial pressure of 3 mmHg. LEFT VENTRICLE PLAX 2D LVIDd:         4.66 cm  Diastology LVIDs:         3.33 cm  LV e' medial:    6.32 cm/s LV PW:         1.48 cm  LV E/e' medial:  12.5 LV IVS:        1.50 cm  LV e' lateral:   8.11 cm/s LVOT diam:     2.10 cm  LV E/e' lateral: 9.7 LVOT Area:     3.46 cm  LEFT ATRIUM           Index LA diam:      4.50 cm 1.88 cm/m LA Vol (A4C): 50.0 ml 20.89 ml/m   AORTA Ao Root diam: 2.80 cm MITRAL VALVE MV Area (PHT): 2.80 cm    SHUNTS MV Decel Time: 271 msec    Systemic Diam: 2.10 cm MV E velocity: 78.80 cm/s MV A velocity: 70.30 cm/s MV E/A ratio:  1.12 Dietrich Pates MD Electronically signed by Dietrich Pates MD Signature Date/Time: 11/28/2020/3:36:32 PM    Final     Microbiology: Recent Results (from the past 240 hour(s))  SARS CORONAVIRUS 2 (TAT 6-24 HRS) Nasopharyngeal Nasopharyngeal Swab     Status: None   Collection Time: 11/28/20  3:29 AM   Specimen: Nasopharyngeal Swab  Result Value Ref Range Status   SARS Coronavirus 2 NEGATIVE NEGATIVE Final    Comment: (NOTE) SARS-CoV-2 target nucleic acids are NOT DETECTED.  The SARS-CoV-2 RNA is generally detectable in upper and lower respiratory specimens during the acute phase of infection. Negative results do not preclude SARS-CoV-2 infection, do not rule  out co-infections with other pathogens, and should not be used as the sole basis for treatment  or other patient management decisions. Negative results must be combined with clinical observations, patient history, and epidemiological information. The expected result is Negative.  Fact Sheet for Patients: SugarRoll.be  Fact Sheet for Healthcare Providers: https://www.woods-mathews.com/  This test is not yet approved or cleared by the [place] FDA and  has been authorized for detection and/or diagnosis of SARS-CoV-2 by FDA under an Emergency Use Authorization (EUA). This EUA will remain  in effect (meaning this test can be used) for the duration of the COVID-19 declaration under Se ction 564(b)(1) of the Act, 21 U.S.C. section 360bbb-3(b)(1), unless the authorization is terminated or revoked sooner.  Performed at [facility], [place] [address], [place], [place] [address]      Labs: Basic Metabolic Panel: Recent Labs  Lab 11/28/20 0145 11/29/20 0654 12/03/20 0613  NA 136 137 138  K 3.8 3.7 3.5  CL 101 100 103  CO2 27 28 24   GLUCOSE 104* 95 93  BUN 16 15 18   CREATININE 1.76* 1.59* 1.72*  CALCIUM 9.5 9.5 9.6  MG 2.3  --   --   PHOS 3.3  --   --    Liver Function Tests: Recent Labs  Lab 11/28/20 0145 11/29/20 0654  AST 24 23  ALT 25 24  ALKPHOS 79 76  BILITOT 1.1 1.6*  PROT 8.0 7.5  ALBUMIN 4.4 4.1   CBC: Recent Labs  Lab 11/28/20 0145 11/29/20 0654  WBC 6.2 6.3  NEUTROABS 3.6  --   HGB 14.0 14.8  HCT 41.3 43.2  MCV 87.5 87.3  PLT 194 180    Principal Problem:   Acute CVA (cerebrovascular accident) ([place]) Active Problems:   DVT (deep venous thrombosis) (HCC)   Pulmonary embolism (HCC)   CKD (chronic kidney disease) stage 3, GFR 30-59 ml/min (HCC)   Nicotine dependence   History of hypertension   H/O medication noncompliance   Acute ischemic stroke ([place])   Essential hypertension   Mixed  hyperlipidemia   Time coordinating discharge: 35 minutes  Signed:  Murray Hodgkins, MD  [facility]  12/04/2020, 1:45 PM

## 2020-12-04 NOTE — Care Management Important Message (Signed)
Important Message  Patient Details  Name: Matthew Wise MRN: [id number] Date of Birth: November 10, 1961   Medicare Important Message Given:  Yes     Mardene Sayer 12/04/2020, 2:34 PM

## 2020-12-06 LAB — FACTOR 5 LEIDEN

## 2020-12-09 ENCOUNTER — Other Ambulatory Visit: Payer: Self-pay | Admitting: *Deleted

## 2020-12-09 NOTE — Patient Outreach (Signed)
[facility]) Care Management  12/09/2020  Matthew Wise 12-17-1960 [id number]   RED ON EMMI ALERT - Stroke Day # 1 Date: 12/06/2020 Red Alert Reason: Not scheduled follow up appointment   Outreach attempt #1, unsuccessful, mailbox full, unable to leave message.   Plan: RN CM will send unsuccessful outreach letter and follow up within the next 3-4 business days.  [name], [place], MSN [place] [phone]

## 2020-12-11 DIAGNOSIS — N1832 Chronic kidney disease, stage 3b: Secondary | ICD-10-CM | POA: Diagnosis not present

## 2020-12-11 DIAGNOSIS — I69354 Hemiplegia and hemiparesis following cerebral infarction affecting left non-dominant side: Secondary | ICD-10-CM | POA: Diagnosis not present

## 2020-12-11 DIAGNOSIS — I1310 Hypertensive heart and chronic kidney disease without heart failure, with stage 1 through stage 4 chronic kidney disease, or unspecified chronic kidney disease: Secondary | ICD-10-CM | POA: Diagnosis not present

## 2020-12-11 DIAGNOSIS — I69392 Facial weakness following cerebral infarction: Secondary | ICD-10-CM | POA: Diagnosis not present

## 2020-12-11 DIAGNOSIS — I69220 Aphasia following other nontraumatic intracranial hemorrhage: Secondary | ICD-10-CM | POA: Diagnosis not present

## 2020-12-13 ENCOUNTER — Other Ambulatory Visit: Payer: Self-pay | Admitting: *Deleted

## 2020-12-13 DIAGNOSIS — I69220 Aphasia following other nontraumatic intracranial hemorrhage: Secondary | ICD-10-CM | POA: Diagnosis not present

## 2020-12-13 DIAGNOSIS — I69354 Hemiplegia and hemiparesis following cerebral infarction affecting left non-dominant side: Secondary | ICD-10-CM | POA: Diagnosis not present

## 2020-12-13 DIAGNOSIS — I69392 Facial weakness following cerebral infarction: Secondary | ICD-10-CM | POA: Diagnosis not present

## 2020-12-13 DIAGNOSIS — I1310 Hypertensive heart and chronic kidney disease without heart failure, with stage 1 through stage 4 chronic kidney disease, or unspecified chronic kidney disease: Secondary | ICD-10-CM | POA: Diagnosis not present

## 2020-12-13 DIAGNOSIS — N1832 Chronic kidney disease, stage 3b: Secondary | ICD-10-CM | POA: Diagnosis not present

## 2020-12-13 NOTE — Patient Outreach (Signed)
[facility]) Care Management  12/13/2020  SIONE BAUMGARTEN [birth year] [id number]   RED ON EMMI ALERT - Stroke Day # 1 Date: 12/06/2020 Red Alert Reason: Not scheduled follow up appointment   Outreach attempt #2, unsuccessful, mailbox full, unable to leave message.   Plan: RN CM will follow up within the next 3-4 business days  [name], Therapist, sports, MSN [place] [phone]

## 2020-12-17 DIAGNOSIS — I69354 Hemiplegia and hemiparesis following cerebral infarction affecting left non-dominant side: Secondary | ICD-10-CM | POA: Diagnosis not present

## 2020-12-17 DIAGNOSIS — I1310 Hypertensive heart and chronic kidney disease without heart failure, with stage 1 through stage 4 chronic kidney disease, or unspecified chronic kidney disease: Secondary | ICD-10-CM | POA: Diagnosis not present

## 2020-12-17 DIAGNOSIS — I69392 Facial weakness following cerebral infarction: Secondary | ICD-10-CM | POA: Diagnosis not present

## 2020-12-17 DIAGNOSIS — N1832 Chronic kidney disease, stage 3b: Secondary | ICD-10-CM | POA: Diagnosis not present

## 2020-12-17 DIAGNOSIS — I69220 Aphasia following other nontraumatic intracranial hemorrhage: Secondary | ICD-10-CM | POA: Diagnosis not present

## 2020-12-19 ENCOUNTER — Ambulatory Visit (INDEPENDENT_AMBULATORY_CARE_PROVIDER_SITE_OTHER): Payer: Medicare Other | Admitting: Nurse Practitioner

## 2020-12-19 ENCOUNTER — Encounter (INDEPENDENT_AMBULATORY_CARE_PROVIDER_SITE_OTHER): Payer: Self-pay | Admitting: Nurse Practitioner

## 2020-12-19 ENCOUNTER — Other Ambulatory Visit: Payer: Self-pay

## 2020-12-19 ENCOUNTER — Other Ambulatory Visit: Payer: Self-pay | Admitting: *Deleted

## 2020-12-19 VITALS — BP 126/80 | HR 62 | Temp 96.8°F | Ht 72.0 in | Wt 264.4 lb

## 2020-12-19 DIAGNOSIS — I69354 Hemiplegia and hemiparesis following cerebral infarction affecting left non-dominant side: Secondary | ICD-10-CM | POA: Diagnosis not present

## 2020-12-19 DIAGNOSIS — I69220 Aphasia following other nontraumatic intracranial hemorrhage: Secondary | ICD-10-CM | POA: Diagnosis not present

## 2020-12-19 DIAGNOSIS — D6851 Activated protein C resistance: Secondary | ICD-10-CM | POA: Diagnosis not present

## 2020-12-19 DIAGNOSIS — I69392 Facial weakness following cerebral infarction: Secondary | ICD-10-CM | POA: Diagnosis not present

## 2020-12-19 DIAGNOSIS — N1832 Chronic kidney disease, stage 3b: Secondary | ICD-10-CM | POA: Diagnosis not present

## 2020-12-19 DIAGNOSIS — R5383 Other fatigue: Secondary | ICD-10-CM

## 2020-12-19 DIAGNOSIS — I639 Cerebral infarction, unspecified: Secondary | ICD-10-CM

## 2020-12-19 DIAGNOSIS — I1310 Hypertensive heart and chronic kidney disease without heart failure, with stage 1 through stage 4 chronic kidney disease, or unspecified chronic kidney disease: Secondary | ICD-10-CM | POA: Diagnosis not present

## 2020-12-19 DIAGNOSIS — R760 Raised antibody titer: Secondary | ICD-10-CM | POA: Diagnosis not present

## 2020-12-19 NOTE — Progress Notes (Signed)
Subjective:  Patient ID: Matthew Wise, male    DOB: [birth year]  Age: 60 y.o. MRN: [id number]  CC:  Chief Complaint  Patient presents with  . Hospitalization Follow-up    Feeling better but does not have much energy, not able to get up early, sleeping a lot      HPI  This patient arrives today for the above.  He is here for posthospitalization follow-up.  He was admitted on 11/28/2020 and discharged on 12/04/2020.  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  He presented to the hospital with slurred speech, facial droop, and leg weakness.  He ended up going to [facility] for neuro evaluation.  At that time he had presented outside the timeframe for interventional radiology as well as tPA.  In addition he was on Xarelto at the time so he did not qualify for tPA at that time either.  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  He does continue on atorvastatin he has been transitioned to apixaban and was told to follow-up with neurology as well as hematology.  He is in a hypercoagulable state, he was found to have positive anticardiolipin antibodies and is possibly positive for factor V Leiden.  Per chart review I do see that he has an appoint with neurology scheduled in approximately 2 months.  It was recommended that he undergo sleep study which he has not completed yet but will hopefully complete once he sees neurology later on this year.  He is not currently scheduled to see hematology and will need a referral for that.  He does report that he has occupational therapy and physical therapy coming out to his home and he does have a shower stool.   Today, he tells me that he is feeling a bit better but experiences quite a bit of fatigue.  He tells me it is hard for him to get up and going in the morning but once he is up and moving he feels that his energy levels are okay.  He denies any signs or symptoms of bleeding  such as abdominal pain or blood in his stool.  Per chart review I do see that last TSH level was elevated but I do not note any recent follow-ups regarding that elevation.  He feels that his mood is stable and denies any feelings of depression.  Last CBC did not show any anemia.  He feels that his speech has returned to normal, and does not feel any significant weakness to either side of his body.  Past Medical History:  Diagnosis Date  . Acute CVA (cerebrovascular accident) ([place]) 09/09/2020  . Chronic anticoagulation 11/14/2014  . CKD (chronic kidney disease) stage 3, GFR 30-59 ml/min (HCC) 10/24/2019  . CKD (chronic kidney disease) stage 3, GFR 30-59 ml/min (HCC) 10/24/2019  . Clotting disorder ([place])   . 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 DVT (deep venous thrombosis) ([place]) 06/15/2011  . DVT of leg (deep venous thrombosis) (HCC)    rt. leg  . Essential hypertension 11/28/2020  . H/O PE (pulmonary embolism) 06/15/2011  . Hyperlipidemia 11/28/2020  . 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 Prediabetes 10/24/2019      Family History  Problem Relation Age of Onset  . COPD Father   . Stroke Brother   . Pulmonary embolism Sister   .  Throat cancer Brother   . Arthritis Mother   . Diabetes Mother   . Stroke Mother   . Hypertension Brother   . Diabetes Brother   . Arthritis Brother   . Diabetes Sister   . Colon cancer Neg Hx     Social History   Social History Narrative   Lives with son   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s   Mows lawns some   Social History   Tobacco Use  . Smoking status: Current Every Day Smoker    Packs/day: 1.00    Years: 30.00    Pack years: 30.00    Types: Cigarettes    Start date: 12/01/1983  . Smokeless tobacco: Never Used  . Tobacco comment: counseled  Substance Use Topics  . Alcohol use: Yes    Alcohol/week: 3.0 standard drinks     Types: 3 Cans of beer per week    Comment: 2-3 beers a day     Current Meds  Medication Sig  . amLODipine (NORVASC) 5 MG tablet Take 1 tablet (5 mg total) by mouth daily.  Marland Kitchen apixaban (ELIQUIS) 5 MG TABS tablet Take 1 tablet (5 mg total) by mouth 2 (two) times daily.  Marland Kitchen atorvastatin (LIPITOR) 40 MG tablet Take 1 tablet (40 mg total) by mouth daily.  . fexofenadine (ALLEGRA) 180 MG tablet Take 180 mg by mouth every other day.  . fluticasone (FLONASE) 50 MCG/ACT nasal spray Place into both nostrils daily.    ROS:  See HPI    Objective:   Today's Vitals: BP 126/80   Pulse 62 Comment: apical  Temp (!) 96.8 F (36 C) (Temporal)   Ht 6' (1.829 m)   Wt 264 lb 6.4 oz (119.9 kg)   SpO2 93%   BMI 35.86 kg/m  Vitals with BMI 12/19/2020 12/19/2020 12/04/2020  Height - 6' 0"  -  Weight - 264 lbs 6 oz -  BMI - 80.03 -  Systolic - 491 791  Diastolic - 80 76  Pulse 62 45 49     Physical Exam Vitals reviewed.  Constitutional:      Appearance: Normal appearance.  HENT:     Head: Normocephalic and atraumatic.  Cardiovascular:     Rate and Rhythm: Normal rate and regular rhythm.  Pulmonary:     Effort: Pulmonary effort is normal.     Breath sounds: Normal breath sounds.  Musculoskeletal:     Cervical back: Neck supple.  Skin:    General: Skin is warm and dry.  Neurological:     Mental Status: He is alert and oriented to person, place, and time.     Cranial Nerves: Cranial nerves are intact.     Sensory: Sensation is intact.     Motor: Motor function is intact.     Gait: Gait is intact.  Psychiatric:        Mood and Affect: Mood normal.        Behavior: Behavior normal.        Thought Content: Thought content normal.        Judgment: Judgment normal.          Assessment and Plan   1. Fatigue, unspecified type   2. Anticardiolipin antibody positive   3. Factor V Leiden ([place])   4. Cerebrovascular accident (CVA), unspecified mechanism ([place])      Plan: 1.  We will  check blood work today for further evaluation. 2.-4.  We will order referral to hematology.  Due to  some staffing shortages I have also given the patient the phone number to the hematologist so that he can call and hopefully schedule appointment himself to prevent delays in this referral.  However, I have ordered the referral here as well so that we can work on this for him in case he requires [place] to do the referral.  He will continue on his apixaban and atorvastatin as prescribed.  He will follow-up with [facility] as scheduled in March, and will hopefully consider undergoing sleep study at that time.   Tests ordered Orders Placed This Encounter  Procedures  . CBC with Differential/Platelets  . CMP with eGFR([facility])  . TSH  . T3, Free  . T4, Free  . Iron  . Ferritin  . Ambulatory referral to Hematology      No orders of the defined types were placed in this encounter.   Patient to follow-up in 3 months or sooner as needed.  Ailene Ards, NP

## 2020-12-19 NOTE — Patient Outreach (Addendum)
[place] [facility]) Care Management  12/19/2020  Matthew Wise 11/15/1961 [id number]   RED ON EMMI ALERT- Stroke Day #1 Date:12/06/2020 Red Alert Reason:Not scheduled follow up appointment   RED ON [place] - Stroke Day # 13 Date: 1/19 Red Alert Reason: Not scheduled follow up, Not went to follow up appointment   Outreach attempt #3, successful.  Identity verified.  This care manager introduced self and stated purpose of call.  [facility] care management services explained.    Member report he has been doing well.  Lives alone, independent in all ADL's/IADL's.  State he had not been to appointments because the dates hadn't come yet.  Since the EMMI alerts, he has had follow up with PCP (was seen today).  He also has appointments scheduled for GI on 2/1 and with neurology on 3/21.  He is active with home health for PT, report compliance with all medications.  Educated on monitoring BP and decreasing risk of recurrent stroke.  Denies any urgent concerns, encouraged to contact this care manager with questions.   Plan: RN CM will send member stroke education and this member's contact information.  Will not open at this time as he denies any further needs.  Matthew Wise, [place], MSN [place] [phone]

## 2020-12-19 NOTE — Patient Instructions (Signed)
Hematology Phone Number: [phone] Dr. Delton Coombes

## 2020-12-20 LAB — CBC WITH DIFFERENTIAL/PLATELET
Absolute Monocytes: 385 cells/uL (ref 200–950)
Basophils Absolute: 22 cells/uL (ref 0–200)
Basophils Relative: 0.4 %
Eosinophils Absolute: 72 cells/uL (ref 15–500)
Eosinophils Relative: 1.3 %
HCT: 40.3 % (ref 38.5–50.0)
Hemoglobin: 13.9 g/dL (ref 13.2–17.1)
Lymphs Abs: 2354 cells/uL (ref 850–3900)
MCH: 29.2 pg (ref 27.0–33.0)
MCHC: 34.5 g/dL (ref 32.0–36.0)
MCV: 84.7 fL (ref 80.0–100.0)
MPV: 10.7 fL (ref 7.5–12.5)
Monocytes Relative: 7 %
Neutro Abs: 2668 cells/uL (ref 1500–7800)
Neutrophils Relative %: 48.5 %
Platelets: 224 10*3/uL (ref 140–400)
RBC: 4.76 10*6/uL (ref 4.20–5.80)
RDW: 13.4 % (ref 11.0–15.0)
Total Lymphocyte: 42.8 %
WBC: 5.5 10*3/uL (ref 3.8–10.8)

## 2020-12-20 LAB — COMPLETE METABOLIC PANEL WITH GFR
AG Ratio: 1.5 (calc) (ref 1.0–2.5)
ALT: 23 U/L (ref 9–46)
AST: 22 U/L (ref 10–35)
Albumin: 4.4 g/dL (ref 3.6–5.1)
Alkaline phosphatase (APISO): 95 U/L (ref 35–144)
BUN/Creatinine Ratio: 7 (calc) (ref 6–22)
BUN: 14 mg/dL (ref 7–25)
CO2: 27 mmol/L (ref 20–32)
Calcium: 9.7 mg/dL (ref 8.6–10.3)
Chloride: 106 mmol/L (ref 98–110)
Creat: 2.01 mg/dL — ABNORMAL HIGH (ref 0.70–1.33)
GFR, Est African American: 41 mL/min/{1.73_m2} — ABNORMAL LOW (ref >=60)
GFR, Est Non African American: 36 mL/min/{1.73_m2} — ABNORMAL LOW (ref >=60)
Globulin: 2.9 g/dL (calc) (ref 1.9–3.7)
Glucose, Bld: 113 mg/dL — ABNORMAL HIGH (ref 65–99)
Potassium: 3.6 mmol/L (ref 3.5–5.3)
Sodium: 141 mmol/L (ref 135–146)
Total Bilirubin: 0.8 mg/dL (ref 0.2–1.2)
Total Protein: 7.3 g/dL (ref 6.1–8.1)

## 2020-12-20 LAB — FERRITIN: Ferritin: 288 ng/mL (ref 38–380)

## 2020-12-20 LAB — T4, FREE: Free T4: 1.2 ng/dL (ref 0.8–1.8)

## 2020-12-20 LAB — T3, FREE: T3, Free: 3.8 pg/mL (ref 2.3–4.2)

## 2020-12-20 LAB — IRON: Iron: 67 ug/dL (ref 50–180)

## 2020-12-20 LAB — TSH: TSH: 8.68 mIU/L — ABNORMAL HIGH (ref 0.40–4.50)

## 2020-12-20 NOTE — Patient Instructions (Signed)
Managing the Challenge of Quitting Smoking Quitting smoking is a physical and mental challenge. You will face cravings, withdrawal symptoms, and temptation. Before quitting, work with your health care provider to make a plan that can help you manage quitting. Preparation can help you quit and keep you from giving in. How to manage lifestyle changes Managing stress Stress can make you want to smoke, and wanting to smoke may cause stress. It is important to find ways to manage your stress. You might try some of the following:  Practice relaxation techniques. ? Breathe slowly and deeply, in through your nose and out through your mouth. ? Listen to music. ? Soak in a bath or take a shower. ? Imagine a peaceful place or vacation.  Get some support. ? Talk with family or friends about your stress. ? Join a support group. ? Talk with a counselor or therapist.  Get some physical activity. ? Go for a walk, run, or bike ride. ? Play a favorite sport. ? Practice yoga.   Medicines Talk with your health care provider about medicines that might help you deal with cravings and make quitting easier for you. Relationships Social situations can be difficult when you are quitting smoking. To manage this, you can:  Avoid parties and other social situations where people might be smoking.  Avoid alcohol.  Leave right away if you have the urge to smoke.  Explain to your family and friends that you are quitting smoking. Ask for support and let them know you might be a bit grumpy.  Plan activities where smoking is not an option. General instructions Be aware that many people gain weight after they quit smoking. However, not everyone does. To keep from gaining weight, have a plan in place before you quit and stick to the plan after you quit. Your plan should include:  Having healthy snacks. When you have a craving, it may help to: ? Eat popcorn, carrots, celery, or other cut vegetables. ? Chew  sugar-free gum.  Changing how you eat. ? Eat small portion sizes at meals. ? Eat 4-6 small meals throughout the day instead of 1-2 large meals a day. ? Be mindful when you eat. Do not watch television or do other things that might distract you as you eat.  Exercising regularly. ? Make time to exercise each day. If you do not have time for a long workout, do short bouts of exercise for 5-10 minutes several times a day. ? Do some form of strengthening exercise, such as weight lifting. ? Do some exercise that gets your heart beating and causes you to breathe deeply, such as walking fast, running, swimming, or biking. This is very important.  Drinking plenty of water or other low-calorie or no-calorie drinks. Drink 6-8 glasses of water daily.   How to recognize withdrawal symptoms Your body and mind may experience discomfort as you try to get used to not having nicotine in your system. These effects are called withdrawal symptoms. They may include:  Feeling hungrier than normal.  Having trouble concentrating.  Feeling irritable or restless.  Having trouble sleeping.  Feeling depressed.  Craving a cigarette. To manage withdrawal symptoms:  Avoid places, people, and activities that trigger your cravings.  Remember why you want to quit.  Get plenty of sleep.  Avoid coffee and other caffeinated drinks. These may worsen some of your symptoms. These symptoms may surprise you. But be assured that they are normal to have when quitting smoking. How to manage cravings   Come up with a plan for how to deal with your cravings. The plan should include the following:  A definition of the specific situation you want to deal with.  An alternative action you will take.  A clear idea for how this action will help.  The name of someone who might help you with this. Cravings usually last for 5-10 minutes. Consider taking the following actions to help you with your plan to deal with  cravings:  Keep your mouth busy. ? Chew sugar-free gum. ? Suck on hard candies or a straw. ? Brush your teeth.  Keep your hands and body busy. ? Change to a different activity right away. ? Squeeze or play with a ball. ? Do an activity or a hobby, such as making bead jewelry, practicing needlepoint, or working with wood. ? Mix up your normal routine. ? Take a short exercise break. Go for a quick walk or run up and down stairs.  Focus on doing something kind or helpful for someone else.  Call a friend or family member to talk during a craving.  Join a support group.  Contact a quitline. Where to find support To get help or find a support group:  Call the [facility]'s Smoking Quitline: 1-800-QUIT NOW 786 801 1436)  Visit the website of the Substance Abuse and [place]: ktimeonline.com  Text QUIT to SmokefreeTXTMQ:317211 Where to find more information Visit these websites to find more information on quitting smoking:  [place]: www.smokefree.gov  American Lung Association: www.lung.org  American Cancer Society: www.cancer.org  Centers for Disease Control and Prevention: http://www.wolf.info/  American Heart Association: www.heart.org Contact a health care provider if:  You want to change your plan for quitting.  The medicines you are taking are not helping.  Your eating feels out of control or you cannot sleep. Get help right away if:  You feel depressed or become very anxious. Summary  Quitting smoking is a physical and mental challenge. You will face cravings, withdrawal symptoms, and temptation to smoke again. Preparation can help you as you go through these challenges.  Try different techniques to manage stress, handle social situations, and prevent weight gain.  You can deal with cravings by keeping your mouth busy (such as by chewing gum), keeping your hands and body busy, calling family or friends, or  contacting a quitline for people who want to quit smoking.  You can deal with withdrawal symptoms by avoiding places where people smoke, getting plenty of rest, and avoiding drinks with caffeine. This information is not intended to replace advice given to you by your health care provider. Make sure you discuss any questions you have with your health care provider. Document Revised: 09/05/2019 Document Reviewed: 09/05/2019 Elsevier Patient Education  2021 [place]. Stroke Prevention Some medical conditions and lifestyle choices can lead to a higher risk for a stroke. You can help to prevent a stroke by eating healthy foods and exercising. It also helps to not smoke and to manage any health problems you may have. How can this condition affect me? A stroke is an emergency. It should be treated right away. A stroke can lead to brain damage or threaten your life. There is a better chance of surviving and getting better after a stroke if you get medical help right away. What can increase my risk? The following medical conditions may increase your risk of a stroke:  Diseases of the heart and blood vessels (cardiovascular disease).  High blood pressure (  hypertension).  Diabetes.  High cholesterol.  Sickle cell disease.  Problems with blood clotting.  Being very overweight.  Sleeping problems (obstructivesleep apnea). Other risk factors include:  Being older than age 41.  A history of blood clots, stroke, or mini-stroke (TIA).  Race, ethnic background, or a family history of stroke.  Smoking or using tobacco products.  Taking birth control pills, especially if you smoke.  Heavy alcohol and drug use.  Not being active. What actions can I take to prevent this? Manage your health conditions  High cholesterol. ? Eat a healthy diet. If this is not enough to manage your cholesterol, you may need to take medicines. ? Take medicines as told by your doctor.  High blood  pressure. ? Try to keep your blood pressure below 130/80. ? If your blood pressure cannot be managed through a healthy diet and regular exercise, you may need to take medicines. ? Take medicines as told by your doctor. ? Ask your doctor if you should check your blood pressure at home. ? Have your blood pressure checked every year.  Diabetes. ? Eat a healthy diet and get regular exercise. If your blood sugar (glucose) cannot be managed through diet and exercise, you may need to take medicines. ? Take medicines as told by your doctor.  Talk to your doctor about getting checked for sleeping problems. Signs of a problem can include: ? Snoring a lot. ? Feeling very tired.  Make sure that you manage any other conditions you have. Nutrition  Follow instructions from your doctor about what to eat or drink. You may be told to: ? Eat and drink fewer calories each day. ? Limit how much salt (sodium) you use to 1,500 milligrams (mg) each day. ? Use only healthy fats for cooking, such as olive oil, canola oil, and sunflower oil. ? Eat healthy foods. To do this:  Choose foods that are high in fiber. These include whole grains, and fresh fruits and vegetables.  Eat at least 5 servings of fruits and vegetables a day. Try to fill one-half of your plate with fruits and vegetables at each meal.  Choose low-fat (lean) proteins. These include low-fat cuts of meat, chicken without skin, fish, tofu, beans, and nuts.  Eat low-fat dairy products. ? Avoid foods that:  Are high in salt.  Have saturated fat.  Have trans fat.  Have cholesterol.  Are processed or pre-made. ? Count how many carbohydrates you eat and drink each day.   Lifestyle  If you drink alcohol: ? Limit how much you have to:  0-1 drink a day for women who are not pregnant.  0-2 drinks a day for men. ? Know how much alcohol is in your drink. In the [place] one drink equals one 12 oz bottle of beer (379mL), one 5 oz glass of wine  (142mL), or one 1 oz glass of hard liquor (45mL).  Do not smoke or use any products that have nicotine or tobacco. If you need help quitting, ask your doctor.  Avoid secondhand smoke.  Do not use drugs. Activity  Try to stay at a healthy weight.  Get at least 30 minutes of exercise on most days, such as: ? Fast walking. ? Biking. ? Swimming.   Medicines  Take over-the-counter and prescription medicines only as told by your doctor.  Avoid taking birth control pills. Talk to your doctor about the risks of taking birth control pills if: ? You are over 64 years old. ? You smoke. ?  You get very bad headaches. ? You have had a blood clot. Where to find more information  American Stroke Association: www.strokeassociation.org Get help right away if:  You or a loved one has any signs of a stroke. "BE FAST" is an easy way to remember the warning signs: ? B - Balance. Dizziness, sudden trouble walking, or loss of balance. ? E - Eyes. Trouble seeing or a change in how you see. ? F - Face. Sudden weakness or loss of feeling of the face. The face or eyelid may droop on one side. ? A - Arms. Weakness or loss of feeling in an arm. This happens all of a sudden and most often on one side of the body. ? S - Speech. Sudden trouble speaking, slurred speech, or trouble understanding what people say. ? T - Time. Time to call emergency services. Write down what time symptoms started.  You or a loved one has other signs of a stroke, such as: ? A sudden, very bad headache with no known cause. ? Feeling like you may vomit (nausea). ? Vomiting. ? A seizure. These symptoms may be an emergency. Get help right away. Call your local emergency services (911 in the [place]  Do not wait to see if the symptoms will go away.  Do not drive yourself to the hospital. Summary  You can help to prevent a stroke by eating healthy, exercising, and not smoking. It also helps to manage any health problems you  have.  Do not smoke or use any products that contain nicotine or tobacco.  Get help right away if you or a loved one has any signs of a stroke. This information is not intended to replace advice given to you by your health care provider. Make sure you discuss any questions you have with your health care provider. Document Revised: 06/17/2020 Document Reviewed: 06/17/2020 Elsevier Patient Education  Matthew Wise.

## 2020-12-23 ENCOUNTER — Other Ambulatory Visit (INDEPENDENT_AMBULATORY_CARE_PROVIDER_SITE_OTHER): Payer: Self-pay | Admitting: Nurse Practitioner

## 2020-12-23 DIAGNOSIS — N1831 Chronic kidney disease, stage 3a: Secondary | ICD-10-CM

## 2020-12-23 DIAGNOSIS — Z7901 Long term (current) use of anticoagulants: Secondary | ICD-10-CM

## 2020-12-23 DIAGNOSIS — Z1211 Encounter for screening for malignant neoplasm of colon: Secondary | ICD-10-CM

## 2020-12-23 DIAGNOSIS — R7303 Prediabetes: Secondary | ICD-10-CM

## 2020-12-23 DIAGNOSIS — I10 Essential (primary) hypertension: Secondary | ICD-10-CM

## 2020-12-24 ENCOUNTER — Other Ambulatory Visit (INDEPENDENT_AMBULATORY_CARE_PROVIDER_SITE_OTHER): Payer: Self-pay | Admitting: Nurse Practitioner

## 2020-12-24 DIAGNOSIS — N1832 Chronic kidney disease, stage 3b: Secondary | ICD-10-CM | POA: Diagnosis not present

## 2020-12-24 DIAGNOSIS — I69392 Facial weakness following cerebral infarction: Secondary | ICD-10-CM | POA: Diagnosis not present

## 2020-12-24 DIAGNOSIS — I1310 Hypertensive heart and chronic kidney disease without heart failure, with stage 1 through stage 4 chronic kidney disease, or unspecified chronic kidney disease: Secondary | ICD-10-CM | POA: Diagnosis not present

## 2020-12-24 DIAGNOSIS — I69220 Aphasia following other nontraumatic intracranial hemorrhage: Secondary | ICD-10-CM | POA: Diagnosis not present

## 2020-12-24 DIAGNOSIS — I69354 Hemiplegia and hemiparesis following cerebral infarction affecting left non-dominant side: Secondary | ICD-10-CM | POA: Diagnosis not present

## 2020-12-25 DIAGNOSIS — I080 Rheumatic disorders of both mitral and aortic valves: Secondary | ICD-10-CM | POA: Diagnosis not present

## 2020-12-25 DIAGNOSIS — I69220 Aphasia following other nontraumatic intracranial hemorrhage: Secondary | ICD-10-CM

## 2020-12-25 DIAGNOSIS — I1310 Hypertensive heart and chronic kidney disease without heart failure, with stage 1 through stage 4 chronic kidney disease, or unspecified chronic kidney disease: Secondary | ICD-10-CM

## 2020-12-25 DIAGNOSIS — N1832 Chronic kidney disease, stage 3b: Secondary | ICD-10-CM

## 2020-12-25 DIAGNOSIS — I69392 Facial weakness following cerebral infarction: Secondary | ICD-10-CM

## 2020-12-25 DIAGNOSIS — E782 Mixed hyperlipidemia: Secondary | ICD-10-CM | POA: Diagnosis not present

## 2020-12-25 DIAGNOSIS — I69354 Hemiplegia and hemiparesis following cerebral infarction affecting left non-dominant side: Secondary | ICD-10-CM

## 2020-12-25 DIAGNOSIS — R001 Bradycardia, unspecified: Secondary | ICD-10-CM

## 2020-12-25 DIAGNOSIS — R7303 Prediabetes: Secondary | ICD-10-CM | POA: Diagnosis not present

## 2020-12-25 DIAGNOSIS — M5030 Other cervical disc degeneration, unspecified cervical region: Secondary | ICD-10-CM | POA: Diagnosis not present

## 2020-12-27 DIAGNOSIS — I69392 Facial weakness following cerebral infarction: Secondary | ICD-10-CM | POA: Diagnosis not present

## 2020-12-27 DIAGNOSIS — I69354 Hemiplegia and hemiparesis following cerebral infarction affecting left non-dominant side: Secondary | ICD-10-CM | POA: Diagnosis not present

## 2020-12-27 DIAGNOSIS — I69220 Aphasia following other nontraumatic intracranial hemorrhage: Secondary | ICD-10-CM | POA: Diagnosis not present

## 2020-12-27 DIAGNOSIS — I1310 Hypertensive heart and chronic kidney disease without heart failure, with stage 1 through stage 4 chronic kidney disease, or unspecified chronic kidney disease: Secondary | ICD-10-CM | POA: Diagnosis not present

## 2020-12-27 DIAGNOSIS — N1832 Chronic kidney disease, stage 3b: Secondary | ICD-10-CM | POA: Diagnosis not present

## 2020-12-30 ENCOUNTER — Encounter: Payer: Self-pay | Admitting: Gastroenterology

## 2020-12-30 ENCOUNTER — Ambulatory Visit (INDEPENDENT_AMBULATORY_CARE_PROVIDER_SITE_OTHER): Payer: Medicare Other | Admitting: Gastroenterology

## 2020-12-30 ENCOUNTER — Other Ambulatory Visit: Payer: Self-pay

## 2020-12-30 VITALS — BP 136/87 | HR 69 | Temp 96.9°F | Ht 72.0 in | Wt 266.4 lb

## 2020-12-30 DIAGNOSIS — Z7901 Long term (current) use of anticoagulants: Secondary | ICD-10-CM | POA: Diagnosis not present

## 2020-12-30 DIAGNOSIS — Z86010 Personal history of colonic polyps: Secondary | ICD-10-CM

## 2020-12-30 DIAGNOSIS — Z860101 Personal history of adenomatous and serrated colon polyps: Secondary | ICD-10-CM | POA: Insufficient documentation

## 2020-12-30 NOTE — Progress Notes (Signed)
Primary Care Physician:  Doree Albee, MD  Primary Gastroenterologist:  Elon Alas. Abbey Chatters, DO   Chief Complaint  Patient presents with  . Colonoscopy    consult    HPI:  Matthew Wise is a 60 y.o. male with history of prediabetes, HTN, hyperlipidemia, CKD, CVA, clotting disorder, DVT, PE presenting for consideration of screening colonoscopy.   In early 11/2020, patient had acute ischemic right MCA stroke. Switched from [employer] to [employer]. No residuals except fatigue from stroke. In 102021 he had TIA requiring hospitalization. Patient reports blood clots in remote past.   From GI standpoint, he is well overdue for colonoscopy. In 2016 his colonoscopy was incomplete. He had two simple adenomas removed. Left colon extremely redundant and with multiple maneuvers, abdominal pressure entire colon could not be examined. He was supposed to go to [facility] for colonoscopy with overbube/flouroscopy to try and complete his colonoscopy but he did not follow through. BM regular. No melena, brbpr. No UGI symptoms. No abdominal pain.  Used to drink a couple of beers daily but quit 10/2020.    No FH of colon cancer.    Current Outpatient Medications  Medication Sig Dispense Refill  . amLODipine (NORVASC) 5 MG tablet Take 1 tablet (5 mg total) by mouth daily. 90 tablet 0  . apixaban (ELIQUIS) 5 MG TABS tablet Take 1 tablet (5 mg total) by mouth 2 (two) times daily. 60 tablet 0  . atorvastatin (LIPITOR) 40 MG tablet TAKE 1 TABLET BY MOUTH EVERY DAY 90 tablet 0  . fluticasone (FLONASE) 50 MCG/ACT nasal spray Place into both nostrils daily.    . fexofenadine (ALLEGRA) 180 MG tablet Take 180 mg by mouth every other day. (Patient not taking: Reported on 12/30/2020)     No current facility-administered medications for this visit.    Allergies as of 12/30/2020  . (No Known Allergies)    Past Medical History:  Diagnosis Date  . Acute CVA (cerebrovascular accident) ([place]) 09/09/2020  . Chronic  anticoagulation 11/14/2014  . CKD (chronic kidney disease) stage 3, GFR 30-59 ml/min (HCC) 10/24/2019  . CKD (chronic kidney disease) stage 3, GFR 30-59 ml/min (HCC) 10/24/2019  . Clotting disorder ([place])   . 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 DVT (deep venous thrombosis) ([place]) 06/15/2011  . DVT of leg (deep venous thrombosis) (HCC)    rt. leg  . Essential hypertension 11/28/2020  . H/O PE (pulmonary embolism) 06/15/2011  . Hyperlipidemia 11/28/2020  . 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 Prediabetes 10/24/2019    Past Surgical History:  Procedure Laterality Date  . COLONOSCOPY WITH PROPOFOL N/A 10/29/2015   Procedure: COLONOSCOPY WITH PROPOFOL;  Surgeon: Danie Binder, MD;  Location: [facility];  Service: Endoscopy;  Laterality: N/A;  930  . TEE WITHOUT CARDIOVERSION N/A 09/10/2020   Procedure: TRANSESOPHAGEAL ECHOCARDIOGRAM (TEE) WITH PROPOFOL;  Surgeon: Satira Sark, MD;  Location: [facility];  Service: Cardiovascular;  Laterality: N/A;    Family History  Problem Relation Age of Onset  . COPD Father   . Stroke Brother   . Pulmonary embolism Sister   . Throat cancer Brother   . Arthritis Mother   . Diabetes Mother   . Stroke Mother   . Hypertension Brother   . Diabetes Brother   . Arthritis Brother   . Diabetes Sister   . Colon cancer Neg Hx  Social History   Socioeconomic History  . Marital status: Divorced    Spouse name: Not on file  . Number of children: 2  . Years of education: 50  . Highest education level: Not on file  Occupational History  . Occupation: disability    Comment: DVT  Tobacco Use  . Smoking status: Current Every Day Smoker    Packs/day: 1.00    Years: 30.00    Pack years: 30.00    Types: Cigarettes    Start date: 12/01/1983  . Smokeless tobacco: Never Used  . Tobacco comment: counseled  Vaping Use  . Vaping Use: Never used   Substance and Sexual Activity  . Alcohol use: Yes    Alcohol/week: 3.0 standard drinks    Types: 3 Cans of beer per week    Comment: 2-3 beers a day; 12/30/20 "I haven't drank anything in past month or so"  . Drug use: Not Currently    Comment: REMOTE HISTORY, Quit in 2007  . Sexual activity: Yes  Other Topics Concern  . Not on file  Social History Narrative   Lives with son   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s   Mows lawns some   Social Determinants of Health   Financial Resource Strain: Not on file  Food Insecurity: Not on file  Transportation Needs: Not on file  Physical Activity: Not on file  Stress: Not on file  Social Connections: Not on file  Intimate Partner Violence: Not on file      ROS:  General: Negative for anorexia, weight loss, fever, chills,++ fatigue, weakness. Eyes: Negative for vision changes.  ENT: Negative for hoarseness, difficulty swallowing , nasal congestion. CV: Negative for chest pain, angina, palpitations, dyspnea on exertion, peripheral edema.  Respiratory: Negative for dyspnea at rest, dyspnea on exertion, cough, sputum, wheezing.  GI: See history of present illness. GU:  Negative for dysuria, hematuria, urinary incontinence, urinary frequency, nocturnal urination.  MS: Negative for joint pain, low back pain.  Derm: Negative for rash or itching.  Neuro: Negative for weakness, abnormal sensation, seizure, frequent headaches, memory loss, confusion.  Psych: Negative for anxiety, depression, suicidal ideation, hallucinations.  Endo: Negative for unusual weight change.  Heme: Negative for bruising or bleeding. Allergy: Negative for rash or hives.    Physical Examination:  BP 136/87   Pulse 69   Temp (!) 96.9 F (36.1 C) (Temporal)   Ht 6' (1.829 m)   Wt 266 lb 6.4 oz (120.8 kg)   BMI 36.13 kg/m    General: Well-nourished, well-developed in no acute distress.  Head: Normocephalic, atraumatic.   Eyes: Conjunctiva pink, no  icterus. Mouth: masked Neck: Supple without thyromegaly, masses, or lymphadenopathy. Cyst noted in right submandibular region.  Lungs: Clear to auscultation bilaterally.  Heart: Regular rate and rhythm, no murmurs rubs or gallops.  Abdomen: Bowel sounds are normal, nontender, nondistended, no hepatosplenomegaly or masses, no abdominal bruits or    hernia , no rebound or guarding.   Rectal: not performed Extremities: No lower extremity edema. No clubbing or deformities.  Neuro: Alert and oriented x 4 , grossly normal neurologically.  Skin: Warm and dry, no rash or jaundice.   Psych: Alert and cooperative, normal mood and affect.  Labs: Lab Results  Component Value Date   CREATININE 2.01 (H) 12/19/2020   BUN 14 12/19/2020   NA 141 12/19/2020   K 3.6 12/19/2020   CL 106 12/19/2020   CO2 27 12/19/2020   Lab Results  Component  Value Date   ALT 23 12/19/2020   AST 22 12/19/2020   ALKPHOS 76 11/29/2020   BILITOT 0.8 12/19/2020   Lab Results  Component Value Date   WBC 5.5 12/19/2020   HGB 13.9 12/19/2020   HCT 40.3 12/19/2020   MCV 84.7 12/19/2020   PLT 224 12/19/2020     Lab Results  Component Value Date   TSH 8.68 (H) 12/19/2020   Lab Results  Component Value Date   IRON 67 12/19/2020   TIBC 275 11/14/2014   FERRITIN 288 12/19/2020     Imaging Studies: CT ANGIO CHEST PE W OR WO CONTRAST  Result Date: 12/03/2020 CLINICAL DATA:  Acute right-sided MCA territory cerebral infarction and history of prior lower extremity DVT. EXAM: CT ANGIOGRAPHY CHEST WITH CONTRAST TECHNIQUE: Multidetector CT imaging of the chest was performed using the standard protocol during bolus administration of intravenous contrast. Multiplanar CT image reconstructions and MIPs were obtained to evaluate the vascular anatomy. CONTRAST:  16mL OMNIPAQUE IOHEXOL 350 MG/ML SOLN COMPARISON:  Prior CTA of the chest on 03/19/2011 FINDINGS: Cardiovascular: The contrast opacification is well timed for pulmonary  arterial and right heart evaluation. Pulmonary arteries are normal in caliber. There is no evidence of pulmonary AV malformation or AV fistula. The heart size is normal. Right-sided chambers are opacified and there is no evidence of visible intracardiac shunting. No pericardial fluid identified. The thoracic aorta is normal in caliber. 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 Mediastinum/Nodes: No enlarged mediastinal, hilar, or axillary lymph nodes. Thyroid gland, trachea, and esophagus demonstrate no significant findings. Lungs/Pleura: There is no evidence of pulmonary edema, consolidation, pneumothorax, nodule or pleural fluid. Upper Abdomen: No acute abnormality. Musculoskeletal: No chest wall abnormality. No acute or significant osseous findings. Review of the MIP images confirms the above findings. IMPRESSION: 1. No evidence of pulmonary embolism, pulmonary AV malformation or pulmonary AV fistula. 2. No evidence of right to left intracardiac shunting by CTA. 3. 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 Electronically Signed   By: Aletta Edouard M.D.   On: 12/03/2020 11:14   VAS [place] TRANSCRANIAL DOPPLER W BUBBLES  Result Date: 12/03/2020  Transcranial Doppler with Bubble Indications: Stroke. Comparison Study: no prior Performing Technologist: Abram Sander RVS  Examination Guidelines: A complete evaluation includes B-mode imaging, spectral Doppler, color Doppler, and power Doppler as needed of all accessible portions of each vessel. Bilateral testing is considered an integral part of a complete examination. Limited examinations for reoccurring indications may be performed as noted.  Summary:  A vascular evaluation was performed. The right middle cerebral artery was studied. An IV was inserted into the patient's right forearm . Verbal informed consent was  obtained.  HITS at rest: none HITS during valsalva: small- spencer grade 1 Positive TCD Bubble study with delayed HITS noted after valasava indicative of a small intrapulmonary shunt likely *See table(s) above for TCD measurements and observations.  Diagnosing physician: Antony Contras MD Electronically signed by Antony Contras MD on 12/03/2020 at 11:17:19 AM.    Final    Assessment/Plan:  60 y/o male with history of clotting disorder with prior DVTs/PE, recent CVA (11/2020) and TIA (08/2020) presenting to schedule colonoscopy for history of adenomatous colon polyps. Patient's last attempted colonoscopy in 2016, incomplete due to redundant colon. Patient did not follow through with recommendations to attempt  colonoscopy under fluoroscopy with overtube at [facility] back in 2016.   From a GI standpoint he is doing well. He reports only residual from recent stroke is fatigue. He is currently on Eliquis for his clotting disorder.   Will discuss colonoscopy with Dr. Abbey Chatters regarding if attempt can be done locally and we will also need to determine when given recent CVA. Patient has follow up with neurology 02/17/21. Further recommendations to follow.

## 2020-12-30 NOTE — Patient Instructions (Signed)
1. I will discuss with Dr. Abbey Chatters, when it would be appropriate to pursue a colonoscopy given your recent stroke and if he feels like he can complete the colonoscopy locally instead of sending you to [facility]. Further recommendations to follow.

## 2020-12-31 ENCOUNTER — Ambulatory Visit: Payer: Medicare Other | Admitting: Gastroenterology

## 2021-01-01 DIAGNOSIS — I69392 Facial weakness following cerebral infarction: Secondary | ICD-10-CM | POA: Diagnosis not present

## 2021-01-01 DIAGNOSIS — I1310 Hypertensive heart and chronic kidney disease without heart failure, with stage 1 through stage 4 chronic kidney disease, or unspecified chronic kidney disease: Secondary | ICD-10-CM | POA: Diagnosis not present

## 2021-01-01 DIAGNOSIS — I69220 Aphasia following other nontraumatic intracranial hemorrhage: Secondary | ICD-10-CM | POA: Diagnosis not present

## 2021-01-01 DIAGNOSIS — N1832 Chronic kidney disease, stage 3b: Secondary | ICD-10-CM | POA: Diagnosis not present

## 2021-01-01 DIAGNOSIS — I69354 Hemiplegia and hemiparesis following cerebral infarction affecting left non-dominant side: Secondary | ICD-10-CM | POA: Diagnosis not present

## 2021-01-02 ENCOUNTER — Telehealth (INDEPENDENT_AMBULATORY_CARE_PROVIDER_SITE_OTHER): Payer: Medicare Other | Admitting: Nurse Practitioner

## 2021-01-02 DIAGNOSIS — R5383 Other fatigue: Secondary | ICD-10-CM

## 2021-01-02 DIAGNOSIS — E038 Other specified hypothyroidism: Secondary | ICD-10-CM | POA: Diagnosis not present

## 2021-01-02 DIAGNOSIS — N1832 Chronic kidney disease, stage 3b: Secondary | ICD-10-CM

## 2021-01-02 MED ORDER — LEVOTHYROXINE SODIUM 25 MCG PO TABS: 75.0000 ug | ORAL_TABLET | Freq: Every day | ORAL | 0 refills | Status: DC

## 2021-01-02 MED ORDER — LEVOTHYROXINE SODIUM 25 MCG PO TABS: 25.0000 ug | ORAL_TABLET | Freq: Every day | ORAL | 0 refills | Status: DC

## 2021-01-02 NOTE — Progress Notes (Signed)
An audio-only tele-health visit was conducted today. I connected with  Matthew Wise on 01/02/21 utilizing audio-only technology and verified that I am speaking with the correct person using two identifiers. The patient was located at their home, and I was located at the office of [facility] during the encounter. I discussed the limitations of evaluation and management by telemedicine. The patient expressed understanding and agreed to proceed.    Subjective:  Patient ID: Matthew Wise, male    DOB: [birth year]  Age: 60 y.o. MRN: [id number]  CC:  Chief Complaint  Patient presents with  . Fatigue  . Chronic Kidney Disease      HPI  This patient arrives today for a virtual visit for the above.  CKD: Last office visit blood work was collected and it did show that he has chronic kidney disease.  Creatinine increased to 2.01 with a EGFR of 41 from creatinine of 1.72 and an EGFR of 46 collected about 3 weeks prior.  Referral to nephrology has been placed.  Fatigue: He was complaining of fatigue at last office visit.  In addition to the change in his kidney function his TSH was elevated at greater than 8.  T3 and T4 were still in normal range.  Past Medical History:  Diagnosis Date  . Acute CVA (cerebrovascular accident) ([place]) 09/09/2020  . Chronic anticoagulation 11/14/2014  . CKD (chronic kidney disease) stage 3, GFR 30-59 ml/min (HCC) 10/24/2019  . CKD (chronic kidney disease) stage 3, GFR 30-59 ml/min (HCC) 10/24/2019  . Clotting disorder ([place])   . 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 DVT (deep venous thrombosis) ([place]) 06/15/2011  . DVT of leg (deep venous thrombosis) (HCC)    rt. leg  . Essential hypertension 11/28/2020  . H/O PE (pulmonary embolism) 06/15/2011  . Hyperlipidemia 11/28/2020  . 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 Prediabetes 10/24/2019       Family History  Problem Relation Age of Onset  . COPD Father   . Stroke Brother   . Pulmonary embolism Sister   . Throat cancer Brother   . Arthritis Mother   . Diabetes Mother   . Stroke Mother   . Hypertension Brother   . Diabetes Brother   . Arthritis Brother   . Diabetes Sister   . Colon cancer Neg Hx     Social History   Social History Narrative   Lives with son   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s   Mows lawns some   Social History   Tobacco Use  . Smoking status: Current Every Day Smoker    Packs/day: 1.00    Years: 30.00    Pack years: 30.00    Types: Cigarettes    Start date: 12/01/1983  . Smokeless tobacco: Never Used  . Tobacco comment: counseled  Substance Use Topics  . Alcohol use: Yes    Alcohol/week: 3.0 standard drinks    Types: 3 Cans of beer per week    Comment: 2-3 beers a day; 12/30/20 "I haven't drank anything in past month or so"     Current Meds  Medication Sig  . amLODipine (NORVASC) 5 MG tablet Take 1 tablet (5 mg total) by mouth daily.  Marland Kitchen apixaban (ELIQUIS) 5 MG TABS tablet Take 1 tablet (5 mg total) by mouth 2 (two) times daily.  Marland Kitchen atorvastatin (LIPITOR) 40 MG tablet TAKE  1 TABLET BY MOUTH EVERY DAY  . fluticasone (FLONASE) 50 MCG/ACT nasal spray Place into both nostrils daily.  . [DISCONTINUED] levothyroxine (SYNTHROID) 25 MCG tablet Take 3 tablets (75 mcg total) by mouth daily.    ROS:  See HPI   Objective:   Today's Vitals: There were no vitals taken for this visit. Vitals with BMI 12/30/2020 12/19/2020 12/19/2020  Height 6' 0"  - 6' 0"   Weight 266 lbs 6 oz - 264 lbs 6 oz  BMI 81.01 - 75.10  Systolic 258 - 527  Diastolic 87 - 80  Pulse 69 62 45     Physical Exam Comprehensive physical exam not completed today as office visit was conducted remotely.  He sounded well over the phone.  Patient was alert and oriented, and appeared to have appropriate judgment.       Assessment and Plan   1. Subclinical  hypothyroidism   2. Fatigue, unspecified type   3. Stage 3b chronic kidney disease ([place])      Plan: 1.-3.  I did discuss the patient's blood work with him and I told him that I do feel like his kidney function needs to be evaluated and managed by a nephrologist and he was notified that this referral has been made.  I recommended that he if he does not hear back from nephrology within 1 to 2 weeks that he call our office to let us know.  I also think that this may be contributing to his energy levels.  We did discuss his elevated TSH even though he had normal thyroid hormones.  I am wondering if his fatigue could be related to this as well.  Per shared decision making we have decided to trial a small dose of levothyroxine to see if this improves his energy levels.  He will follow-up in 6 weeks to have blood rechecked as well as to determine how he is tolerating the medicine.  Potential side effects were discussed and that if these were to occur he should call the office.  He tells me he understands.  At first I accidentally sent in a prescription for 75 mcg of levothyroxine, but I have corrected this and I have called the patient's pharmacy to notify the technician so that they will fill the correct dose which is 25 mcg by mouth daily.   Tests ordered No orders of the defined types were placed in this encounter.     Meds ordered this encounter  Medications  . DISCONTD: levothyroxine (SYNTHROID) 25 MCG tablet    Sig: Take 3 tablets (75 mcg total) by mouth daily.    Dispense:  90 tablet    Refill:  0    Order Specific Question:   Supervising Provider    Answer:   Hurshel Party C [[id number]]  . levothyroxine (SYNTHROID) 25 MCG tablet    Sig: Take 1 tablet (25 mcg total) by mouth daily.    Dispense:  90 tablet    Refill:  0    Order Specific Question:   Supervising Provider    Answer:   Doree Albee [[id number]]    Patient to follow-up in 6 weeks or sooner as needed.  Total time spent on the  phone today was 10 minutes and 25 seconds.  Ailene Ards, NP

## 2021-01-05 ENCOUNTER — Encounter: Payer: Self-pay | Admitting: Gastroenterology

## 2021-01-07 ENCOUNTER — Other Ambulatory Visit (INDEPENDENT_AMBULATORY_CARE_PROVIDER_SITE_OTHER): Payer: Self-pay | Admitting: Nurse Practitioner

## 2021-01-07 DIAGNOSIS — I69392 Facial weakness following cerebral infarction: Secondary | ICD-10-CM | POA: Diagnosis not present

## 2021-01-07 DIAGNOSIS — N1831 Chronic kidney disease, stage 3a: Secondary | ICD-10-CM

## 2021-01-07 DIAGNOSIS — N1832 Chronic kidney disease, stage 3b: Secondary | ICD-10-CM | POA: Diagnosis not present

## 2021-01-07 DIAGNOSIS — Z1211 Encounter for screening for malignant neoplasm of colon: Secondary | ICD-10-CM

## 2021-01-07 DIAGNOSIS — I69220 Aphasia following other nontraumatic intracranial hemorrhage: Secondary | ICD-10-CM | POA: Diagnosis not present

## 2021-01-07 DIAGNOSIS — R7303 Prediabetes: Secondary | ICD-10-CM

## 2021-01-07 DIAGNOSIS — Z7901 Long term (current) use of anticoagulants: Secondary | ICD-10-CM

## 2021-01-07 DIAGNOSIS — I69354 Hemiplegia and hemiparesis following cerebral infarction affecting left non-dominant side: Secondary | ICD-10-CM | POA: Diagnosis not present

## 2021-01-07 DIAGNOSIS — I1310 Hypertensive heart and chronic kidney disease without heart failure, with stage 1 through stage 4 chronic kidney disease, or unspecified chronic kidney disease: Secondary | ICD-10-CM | POA: Diagnosis not present

## 2021-01-07 DIAGNOSIS — E038 Other specified hypothyroidism: Secondary | ICD-10-CM

## 2021-01-07 DIAGNOSIS — I10 Essential (primary) hypertension: Secondary | ICD-10-CM

## 2021-01-08 ENCOUNTER — Telehealth (INDEPENDENT_AMBULATORY_CARE_PROVIDER_SITE_OTHER): Payer: Self-pay | Admitting: Internal Medicine

## 2021-01-08 ENCOUNTER — Other Ambulatory Visit (INDEPENDENT_AMBULATORY_CARE_PROVIDER_SITE_OTHER): Payer: Self-pay | Admitting: Internal Medicine

## 2021-01-08 MED ORDER — APIXABAN 5 MG PO TABS: 5.0000 mg | ORAL_TABLET | Freq: Two times a day (BID) | ORAL | 3 refills | Status: DC

## 2021-01-09 ENCOUNTER — Encounter: Payer: Self-pay | Admitting: Internal Medicine

## 2021-01-09 ENCOUNTER — Telehealth: Payer: Self-pay | Admitting: Gastroenterology

## 2021-01-09 NOTE — Telephone Encounter (Signed)
Patient scheduled and called with date and time

## 2021-01-09 NOTE — Telephone Encounter (Signed)
Please let patient know that I discussed case with Dr. Abbey Chatters. Dr. Abbey Chatters states he will be glad to perform colonoscopy locally (as opposed to sending him to [facility] as suggested by Dr. Oneida Alar in 2016).   However give recent stroke, he advises patient to follow up with neurology as planned on 3/21 (need neuro clearance prior to colonoscopy).   We need to see him back after his neuro appt and then we can schedule colonoscopy. Hopefully off Eliquis if he is only on it for DVTS/PEs and not stroke prevention.     Please make appt for follow up with Dr. Abbey Chatters AFTER 02/17/21.

## 2021-01-09 NOTE — Telephone Encounter (Signed)
Phoned the patient and advised him that Dr. Abbey Chatters will be doing his colonoscopy here in [place] (the pt stated that will be best  for him). Also advised he is to follow-up with Dr. Abbey Chatters here after his Neurologist appt and we would be contacting him to get this scheduled. (advised not to miss the neuro appt because I have to get clearance for his colonoscopy). He agreed.

## 2021-01-09 NOTE — Telephone Encounter (Signed)
Done

## 2021-01-09 NOTE — Telephone Encounter (Signed)
noted 

## 2021-01-21 ENCOUNTER — Encounter (HOSPITAL_COMMUNITY): Payer: Self-pay | Admitting: *Deleted

## 2021-01-21 ENCOUNTER — Encounter: Payer: Self-pay | Admitting: Neurology

## 2021-01-21 ENCOUNTER — Emergency Department (HOSPITAL_COMMUNITY): Payer: Medicare Other

## 2021-01-21 ENCOUNTER — Observation Stay (HOSPITAL_COMMUNITY)
Admission: EM | Admit: 2021-01-21 | Discharge: 2021-01-22 | Disposition: A | Discharge status: Home / Self Care | Payer: Medicare Other | Attending: Internal Medicine | Admitting: Internal Medicine

## 2021-01-21 ENCOUNTER — Other Ambulatory Visit: Payer: Self-pay

## 2021-01-21 DIAGNOSIS — I129 Hypertensive chronic kidney disease with stage 1 through stage 4 chronic kidney disease, or unspecified chronic kidney disease: Secondary | ICD-10-CM | POA: Insufficient documentation

## 2021-01-21 DIAGNOSIS — E039 Hypothyroidism, unspecified: Secondary | ICD-10-CM | POA: Insufficient documentation

## 2021-01-21 DIAGNOSIS — S60511A Abrasion of right hand, initial encounter: Secondary | ICD-10-CM | POA: Diagnosis not present

## 2021-01-21 DIAGNOSIS — N183 Chronic kidney disease, stage 3 unspecified: Secondary | ICD-10-CM | POA: Diagnosis not present

## 2021-01-21 DIAGNOSIS — R531 Weakness: Secondary | ICD-10-CM | POA: Diagnosis not present

## 2021-01-21 DIAGNOSIS — Z7901 Long term (current) use of anticoagulants: Secondary | ICD-10-CM | POA: Insufficient documentation

## 2021-01-21 DIAGNOSIS — R55 Syncope and collapse: Secondary | ICD-10-CM

## 2021-01-21 DIAGNOSIS — S0990XA Unspecified injury of head, initial encounter: Secondary | ICD-10-CM | POA: Diagnosis not present

## 2021-01-21 DIAGNOSIS — Z8673 Personal history of transient ischemic attack (TIA), and cerebral infarction without residual deficits: Secondary | ICD-10-CM | POA: Diagnosis not present

## 2021-01-21 DIAGNOSIS — Z20822 Contact with and (suspected) exposure to covid-19: Secondary | ICD-10-CM | POA: Diagnosis not present

## 2021-01-21 DIAGNOSIS — F17210 Nicotine dependence, cigarettes, uncomplicated: Secondary | ICD-10-CM | POA: Insufficient documentation

## 2021-01-21 DIAGNOSIS — R42 Dizziness and giddiness: Secondary | ICD-10-CM

## 2021-01-21 DIAGNOSIS — Y92410 Unspecified street and highway as the place of occurrence of the external cause: Secondary | ICD-10-CM | POA: Insufficient documentation

## 2021-01-21 DIAGNOSIS — Z743 Need for continuous supervision: Secondary | ICD-10-CM | POA: Diagnosis not present

## 2021-01-21 DIAGNOSIS — R0902 Hypoxemia: Secondary | ICD-10-CM | POA: Diagnosis not present

## 2021-01-21 DIAGNOSIS — Z79899 Other long term (current) drug therapy: Secondary | ICD-10-CM | POA: Diagnosis not present

## 2021-01-21 DIAGNOSIS — R9431 Abnormal electrocardiogram [ECG] [EKG]: Secondary | ICD-10-CM | POA: Diagnosis not present

## 2021-01-21 DIAGNOSIS — R519 Headache, unspecified: Secondary | ICD-10-CM | POA: Diagnosis not present

## 2021-01-21 LAB — CBC WITH DIFFERENTIAL/PLATELET
Abs Immature Granulocytes: 0.02 10*3/uL (ref 0.00–0.07)
Basophils Absolute: 0 10*3/uL (ref 0.0–0.1)
Basophils Relative: 0 %
Eosinophils Absolute: 0 10*3/uL (ref 0.0–0.5)
Eosinophils Relative: 1 %
HCT: 39.4 % (ref 39.0–52.0)
Hemoglobin: 12.9 g/dL — ABNORMAL LOW (ref 13.0–17.0)
Immature Granulocytes: 0 %
Lymphocytes Relative: 24 %
Lymphs Abs: 1.4 10*3/uL (ref 0.7–4.0)
MCH: 28.4 pg (ref 26.0–34.0)
MCHC: 32.7 g/dL (ref 30.0–36.0)
MCV: 86.8 fL (ref 80.0–100.0)
Monocytes Absolute: 0.5 10*3/uL (ref 0.1–1.0)
Monocytes Relative: 8 %
Neutro Abs: 4.1 10*3/uL (ref 1.7–7.7)
Neutrophils Relative %: 67 %
Platelets: 182 10*3/uL (ref 150–400)
RBC: 4.54 MIL/uL (ref 4.22–5.81)
RDW: 13.8 % (ref 11.5–15.5)
WBC: 6.1 10*3/uL (ref 4.0–10.5)
nRBC: 0 % (ref 0.0–0.2)

## 2021-01-21 LAB — BASIC METABOLIC PANEL
Anion gap: 8 (ref 5–15)
BUN: 18 mg/dL (ref 6–20)
CO2: 25 mmol/L (ref 22–32)
Calcium: 8.9 mg/dL (ref 8.9–10.3)
Chloride: 102 mmol/L (ref 98–111)
Creatinine, Ser: 1.66 mg/dL — ABNORMAL HIGH (ref 0.61–1.24)
GFR, Estimated: 47 mL/min — ABNORMAL LOW (ref >60)
Glucose, Bld: 120 mg/dL — ABNORMAL HIGH (ref 70–99)
Potassium: 3.5 mmol/L (ref 3.5–5.1)
Sodium: 135 mmol/L (ref 135–145)

## 2021-01-21 LAB — TROPONIN I (HIGH SENSITIVITY)
Troponin I (High Sensitivity): 26 ng/L — ABNORMAL HIGH (ref <18)
Troponin I (High Sensitivity): 31 ng/L — ABNORMAL HIGH (ref <18)
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
## 2021-01-21 MED ORDER — ONDANSETRON HCL 4 MG PO TABS: 4.0000 mg | ORAL_TABLET | Freq: Four times a day (QID) | ORAL | Status: DC | PRN

## 2021-01-21 MED ORDER — ACETAMINOPHEN 650 MG RE SUPP: 650.0000 mg | Freq: Four times a day (QID) | RECTAL | Status: DC | PRN

## 2021-01-21 MED ORDER — SODIUM CHLORIDE 0.9 % IV BOLUS
1000.0000 mL | Freq: Once | INTRAVENOUS | Status: AC
  Administered 2021-01-21: 1000 mL via INTRAVENOUS

## 2021-01-21 MED ORDER — LEVOTHYROXINE SODIUM 50 MCG PO TABS: 25.0000 ug | ORAL_TABLET | Freq: Every day | ORAL | Status: DC

## 2021-01-21 MED ORDER — ADULT MULTIVITAMIN W/MINERALS CH
ORAL_TABLET | Freq: Every day | ORAL | Status: DC
  Administered 2021-01-22: 1 via ORAL
  Filled 2021-01-21: qty 1

## 2021-01-21 MED ORDER — LEVOTHYROXINE SODIUM 25 MCG PO TABS
25.0000 ug | ORAL_TABLET | Freq: Every day | ORAL | Status: DC
  Administered 2021-01-22: 25 ug via ORAL
  Filled 2021-01-21: qty 1

## 2021-01-21 MED ORDER — ACETAMINOPHEN 325 MG PO TABS: 650.0000 mg | ORAL_TABLET | Freq: Four times a day (QID) | ORAL | Status: DC | PRN

## 2021-01-21 MED ORDER — OXYCODONE HCL 5 MG PO TABS: 5.0000 mg | ORAL_TABLET | ORAL | Status: DC | PRN

## 2021-01-21 MED ORDER — ATORVASTATIN CALCIUM 40 MG PO TABS
40.0000 mg | ORAL_TABLET | Freq: Every day | ORAL | Status: DC
  Administered 2021-01-22: 40 mg via ORAL
  Filled 2021-01-21: qty 1

## 2021-01-21 MED ORDER — ONDANSETRON HCL 4 MG/2ML IJ SOLN: 4.0000 mg | Freq: Four times a day (QID) | INTRAMUSCULAR | Status: DC | PRN

## 2021-01-21 MED ORDER — AMLODIPINE BESYLATE 5 MG PO TABS
5.0000 mg | ORAL_TABLET | Freq: Every day | ORAL | Status: DC
  Administered 2021-01-22: 5 mg via ORAL
  Filled 2021-01-21: qty 1

## 2021-01-21 MED ORDER — APIXABAN 5 MG PO TABS
5.0000 mg | ORAL_TABLET | Freq: Two times a day (BID) | ORAL | Status: DC
  Administered 2021-01-22: 5 mg via ORAL
  Filled 2021-01-21: qty 1

## 2021-01-21 NOTE — ED Notes (Signed)
Patient transported to CT 
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
## 2021-01-21 NOTE — ED Provider Notes (Signed)
2205: Repeat trop 31. Spoke to Dr Clearence Ped who will see patient.    Kinnie Feil, PA-C 01/21/21 2205    Milton Ferguson, MD 01/22/21 [phone]
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
## 2021-01-22 ENCOUNTER — Observation Stay (HOSPITAL_BASED_OUTPATIENT_CLINIC_OR_DEPARTMENT_OTHER): Payer: Medicare Other

## 2021-01-22 DIAGNOSIS — Z743 Need for continuous supervision: Secondary | ICD-10-CM | POA: Diagnosis not present

## 2021-01-22 DIAGNOSIS — R55 Syncope and collapse: Secondary | ICD-10-CM | POA: Diagnosis not present

## 2021-01-22 DIAGNOSIS — N1831 Chronic kidney disease, stage 3a: Secondary | ICD-10-CM | POA: Diagnosis not present

## 2021-01-22 DIAGNOSIS — E042 Nontoxic multinodular goiter: Secondary | ICD-10-CM | POA: Diagnosis not present

## 2021-01-22 DIAGNOSIS — R531 Weakness: Secondary | ICD-10-CM | POA: Diagnosis not present

## 2021-01-22 DIAGNOSIS — D6859 Other primary thrombophilia: Secondary | ICD-10-CM | POA: Diagnosis not present

## 2021-01-22 DIAGNOSIS — Z86718 Personal history of other venous thrombosis and embolism: Secondary | ICD-10-CM | POA: Diagnosis not present

## 2021-01-22 DIAGNOSIS — I6930 Unspecified sequelae of cerebral infarction: Secondary | ICD-10-CM | POA: Diagnosis not present

## 2021-01-22 DIAGNOSIS — G9389 Other specified disorders of brain: Secondary | ICD-10-CM | POA: Diagnosis not present

## 2021-01-22 DIAGNOSIS — Z8261 Family history of arthritis: Secondary | ICD-10-CM | POA: Diagnosis not present

## 2021-01-22 DIAGNOSIS — Z79890 Hormone replacement therapy (postmenopausal): Secondary | ICD-10-CM | POA: Diagnosis not present

## 2021-01-22 DIAGNOSIS — I639 Cerebral infarction, unspecified: Secondary | ICD-10-CM | POA: Diagnosis not present

## 2021-01-22 DIAGNOSIS — E162 Hypoglycemia, unspecified: Secondary | ICD-10-CM | POA: Diagnosis not present

## 2021-01-22 DIAGNOSIS — Z823 Family history of stroke: Secondary | ICD-10-CM | POA: Diagnosis not present

## 2021-01-22 DIAGNOSIS — Z79899 Other long term (current) drug therapy: Secondary | ICD-10-CM | POA: Diagnosis not present

## 2021-01-22 DIAGNOSIS — R5381 Other malaise: Secondary | ICD-10-CM | POA: Diagnosis not present

## 2021-01-22 DIAGNOSIS — Z8249 Family history of ischemic heart disease and other diseases of the circulatory system: Secondary | ICD-10-CM | POA: Diagnosis not present

## 2021-01-22 DIAGNOSIS — Z20822 Contact with and (suspected) exposure to covid-19: Secondary | ICD-10-CM | POA: Diagnosis not present

## 2021-01-22 DIAGNOSIS — Z7901 Long term (current) use of anticoagulants: Secondary | ICD-10-CM | POA: Diagnosis not present

## 2021-01-22 DIAGNOSIS — E11649 Type 2 diabetes mellitus with hypoglycemia without coma: Secondary | ICD-10-CM | POA: Diagnosis not present

## 2021-01-22 DIAGNOSIS — M79605 Pain in left leg: Secondary | ICD-10-CM | POA: Diagnosis not present

## 2021-01-22 DIAGNOSIS — Z86711 Personal history of pulmonary embolism: Secondary | ICD-10-CM | POA: Diagnosis not present

## 2021-01-22 DIAGNOSIS — G319 Degenerative disease of nervous system, unspecified: Secondary | ICD-10-CM | POA: Diagnosis not present

## 2021-01-22 DIAGNOSIS — I6782 Cerebral ischemia: Secondary | ICD-10-CM | POA: Diagnosis not present

## 2021-01-22 DIAGNOSIS — F17210 Nicotine dependence, cigarettes, uncomplicated: Secondary | ICD-10-CM | POA: Diagnosis not present

## 2021-01-22 DIAGNOSIS — E041 Nontoxic single thyroid nodule: Secondary | ICD-10-CM | POA: Diagnosis not present

## 2021-01-22 DIAGNOSIS — Z808 Family history of malignant neoplasm of other organs or systems: Secondary | ICD-10-CM | POA: Diagnosis not present

## 2021-01-22 DIAGNOSIS — R9431 Abnormal electrocardiogram [ECG] [EKG]: Secondary | ICD-10-CM | POA: Diagnosis not present

## 2021-01-22 DIAGNOSIS — I63411 Cerebral infarction due to embolism of right middle cerebral artery: Secondary | ICD-10-CM | POA: Diagnosis not present

## 2021-01-22 DIAGNOSIS — E039 Hypothyroidism, unspecified: Secondary | ICD-10-CM | POA: Diagnosis not present

## 2021-01-22 DIAGNOSIS — I69398 Other sequelae of cerebral infarction: Secondary | ICD-10-CM | POA: Diagnosis not present

## 2021-01-22 DIAGNOSIS — I6529 Occlusion and stenosis of unspecified carotid artery: Secondary | ICD-10-CM | POA: Diagnosis not present

## 2021-01-22 DIAGNOSIS — Z825 Family history of asthma and other chronic lower respiratory diseases: Secondary | ICD-10-CM | POA: Diagnosis not present

## 2021-01-22 DIAGNOSIS — I700 Atherosclerosis of aorta: Secondary | ICD-10-CM | POA: Diagnosis not present

## 2021-01-22 DIAGNOSIS — I10 Essential (primary) hypertension: Secondary | ICD-10-CM | POA: Diagnosis not present

## 2021-01-22 DIAGNOSIS — R200 Anesthesia of skin: Secondary | ICD-10-CM | POA: Diagnosis present

## 2021-01-22 DIAGNOSIS — Z833 Family history of diabetes mellitus: Secondary | ICD-10-CM | POA: Diagnosis not present

## 2021-01-22 DIAGNOSIS — I129 Hypertensive chronic kidney disease with stage 1 through stage 4 chronic kidney disease, or unspecified chronic kidney disease: Secondary | ICD-10-CM | POA: Diagnosis not present

## 2021-01-22 DIAGNOSIS — E782 Mixed hyperlipidemia: Secondary | ICD-10-CM | POA: Diagnosis not present

## 2021-01-22 LAB — CBC
HCT: 37.7 % — ABNORMAL LOW (ref 39.0–52.0)
Hemoglobin: 12.9 g/dL — ABNORMAL LOW (ref 13.0–17.0)
MCH: 29.2 pg (ref 26.0–34.0)
MCHC: 34.2 g/dL (ref 30.0–36.0)
MCV: 85.3 fL (ref 80.0–100.0)
Platelets: 179 10*3/uL (ref 150–400)
RBC: 4.42 MIL/uL (ref 4.22–5.81)
RDW: 13.6 % (ref 11.5–15.5)
WBC: 6.1 10*3/uL (ref 4.0–10.5)
nRBC: 0 % (ref 0.0–0.2)

## 2021-01-22 LAB — TSH: TSH: 2.158 u[IU]/mL (ref 0.350–4.500)

## 2021-01-22 LAB — COMPREHENSIVE METABOLIC PANEL
ALT: 26 U/L (ref 0–44)
AST: 22 U/L (ref 15–41)
Albumin: 3.9 g/dL (ref 3.5–5.0)
Alkaline Phosphatase: 85 U/L (ref 38–126)
Anion gap: 8 (ref 5–15)
BUN: 15 mg/dL (ref 6–20)
CO2: 25 mmol/L (ref 22–32)
Calcium: 9.2 mg/dL (ref 8.9–10.3)
Chloride: 106 mmol/L (ref 98–111)
Creatinine, Ser: 1.39 mg/dL — ABNORMAL HIGH (ref 0.61–1.24)
GFR, Estimated: 59 mL/min — ABNORMAL LOW (ref >60)
Glucose, Bld: 87 mg/dL (ref 70–99)
Potassium: 3.4 mmol/L — ABNORMAL LOW (ref 3.5–5.1)
Sodium: 139 mmol/L (ref 135–145)
Total Bilirubin: 1.3 mg/dL — ABNORMAL HIGH (ref 0.3–1.2)
Total Protein: 7.1 g/dL (ref 6.5–8.1)

## 2021-01-22 LAB — SARS CORONAVIRUS 2 (TAT 6-24 HRS): SARS Coronavirus 2: NEGATIVE

## 2021-01-22 LAB — ECHOCARDIOGRAM COMPLETE
Height: 72 in
Weight: 4229.3 oz

## 2021-01-22 LAB — TROPONIN I (HIGH SENSITIVITY): Troponin I (High Sensitivity): 39 ng/L — ABNORMAL HIGH (ref <18)

## 2021-01-22 NOTE — Progress Notes (Signed)
Pt discharged via wheelchair to POV with all personal belongings in his possession.

## 2021-01-22 NOTE — Discharge Instructions (Signed)
Syncope Syncope is when you pass out (faint) for a short time. It is caused by a sudden decrease in blood flow to the brain. Signs that you may be about to pass out include:  Feeling dizzy or light-headed.  Feeling sick to your stomach (nauseous).  Seeing all white or all black.  Having cold, clammy skin. If you pass out, get help right away. Call your local emergency services (911 in the [place] Do not drive yourself to the hospital. Follow these instructions at home: Watch for any changes in your symptoms. Take these actions to stay safe and help with your symptoms: Lifestyle  Do not drive, use machinery, or play sports until your doctor says it is okay.  Do not drink alcohol.  Do not use any products that contain nicotine or tobacco, such as cigarettes and e-cigarettes. If you need help quitting, ask your doctor.  Drink enough fluid to keep your pee (urine) pale yellow. General instructions  Take over-the-counter and prescription medicines only as told by your doctor.  If you are taking blood pressure or heart medicine, sit up and stand up slowly. Spend a few minutes getting ready to sit and then stand. This can help you feel less dizzy.  Have someone stay with you until you feel stable.  If you start to feel like you might pass out, lie down right away and raise (elevate) your feet above the level of your heart. Breathe deeply and steadily. Wait until all of the symptoms are gone.  Keep all follow-up visits as told by your doctor. This is important. Get help right away if:  You have a very bad headache.  You pass out once or more than once.  You have pain in your chest, belly, or back.  You have a very fast or uneven heartbeat (palpitations).  It hurts to breathe.  You are bleeding from your mouth or your bottom (rectum).  You have black or tarry poop (stool).  You have jerky movements that you cannot control (seizure).  You are confused.  You have trouble  walking.  You are very weak.  You have vision problems. These symptoms may be an emergency. Do not wait to see if the symptoms will go away. Get medical help right away. Call your local emergency services (911 in the [place] Do not drive yourself to the hospital. Summary  Syncope is when you pass out (faint) for a short time. It is caused by a sudden decrease in blood flow to the brain.  Signs that you may be about to faint include feeling dizzy, light-headed, or sick to your stomach, seeing all white or all black, or having cold, clammy skin.  If you start to feel like you might pass out, lie down right away and raise (elevate) your feet above the level of your heart. Breathe deeply and steadily. Wait until all of the symptoms are gone. This information is not intended to replace advice given to you by your health care provider. Make sure you discuss any questions you have with your health care provider. Document Revised: 12/27/2019 Document Reviewed: 12/29/2017 Elsevier Patient Education  2021 [employer] Inc[employer]  

## 2021-01-22 NOTE — Progress Notes (Signed)
Pt arrived to room #334 via [place] from ED. Oriented to room and safety precautions, states understanding.

## 2021-01-22 NOTE — Progress Notes (Signed)
*  PRELIMINARY RESULTS* Echocardiogram 2D Echocardiogram has been performed.  Leavy Cella 01/22/2021, 11:40 AM
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
## 2021-01-25 ENCOUNTER — Emergency Department (HOSPITAL_COMMUNITY): Payer: Medicare Other

## 2021-01-25 ENCOUNTER — Inpatient Hospital Stay (HOSPITAL_COMMUNITY)
Admission: EM | Admit: 2021-01-25 | Discharge: 2021-01-27 | DRG: 065 | Disposition: A | Discharge status: Home Health | Payer: Medicare Other | Attending: Family Medicine | Admitting: Family Medicine

## 2021-01-25 ENCOUNTER — Encounter (HOSPITAL_COMMUNITY): Payer: Self-pay | Admitting: Internal Medicine

## 2021-01-25 DIAGNOSIS — E042 Nontoxic multinodular goiter: Secondary | ICD-10-CM | POA: Diagnosis present

## 2021-01-25 DIAGNOSIS — Z825 Family history of asthma and other chronic lower respiratory diseases: Secondary | ICD-10-CM | POA: Diagnosis not present

## 2021-01-25 DIAGNOSIS — Z79899 Other long term (current) drug therapy: Secondary | ICD-10-CM

## 2021-01-25 DIAGNOSIS — R29810 Facial weakness: Secondary | ICD-10-CM | POA: Diagnosis present

## 2021-01-25 DIAGNOSIS — I6930 Unspecified sequelae of cerebral infarction: Secondary | ICD-10-CM

## 2021-01-25 DIAGNOSIS — E041 Nontoxic single thyroid nodule: Secondary | ICD-10-CM | POA: Diagnosis not present

## 2021-01-25 DIAGNOSIS — I82409 Acute embolism and thrombosis of unspecified deep veins of unspecified lower extremity: Secondary | ICD-10-CM | POA: Diagnosis present

## 2021-01-25 DIAGNOSIS — D571 Sickle-cell disease without crisis: Secondary | ICD-10-CM | POA: Diagnosis present

## 2021-01-25 DIAGNOSIS — Z823 Family history of stroke: Secondary | ICD-10-CM

## 2021-01-25 DIAGNOSIS — N1831 Chronic kidney disease, stage 3a: Secondary | ICD-10-CM | POA: Diagnosis present

## 2021-01-25 DIAGNOSIS — Z86711 Personal history of pulmonary embolism: Secondary | ICD-10-CM

## 2021-01-25 DIAGNOSIS — I63411 Cerebral infarction due to embolism of right middle cerebral artery: Secondary | ICD-10-CM | POA: Diagnosis present

## 2021-01-25 DIAGNOSIS — I6529 Occlusion and stenosis of unspecified carotid artery: Secondary | ICD-10-CM | POA: Diagnosis present

## 2021-01-25 DIAGNOSIS — Z833 Family history of diabetes mellitus: Secondary | ICD-10-CM

## 2021-01-25 DIAGNOSIS — D6859 Other primary thrombophilia: Secondary | ICD-10-CM | POA: Diagnosis present

## 2021-01-25 DIAGNOSIS — E669 Obesity, unspecified: Secondary | ICD-10-CM | POA: Diagnosis present

## 2021-01-25 DIAGNOSIS — Z8261 Family history of arthritis: Secondary | ICD-10-CM | POA: Diagnosis not present

## 2021-01-25 DIAGNOSIS — Z6834 Body mass index (BMI) 34.0-34.9, adult: Secondary | ICD-10-CM

## 2021-01-25 DIAGNOSIS — I129 Hypertensive chronic kidney disease with stage 1 through stage 4 chronic kidney disease, or unspecified chronic kidney disease: Secondary | ICD-10-CM | POA: Diagnosis present

## 2021-01-25 DIAGNOSIS — H5120 Internuclear ophthalmoplegia, unspecified eye: Secondary | ICD-10-CM | POA: Diagnosis present

## 2021-01-25 DIAGNOSIS — E11649 Type 2 diabetes mellitus with hypoglycemia without coma: Secondary | ICD-10-CM | POA: Diagnosis present

## 2021-01-25 DIAGNOSIS — Z8249 Family history of ischemic heart disease and other diseases of the circulatory system: Secondary | ICD-10-CM | POA: Diagnosis not present

## 2021-01-25 DIAGNOSIS — E162 Hypoglycemia, unspecified: Secondary | ICD-10-CM

## 2021-01-25 DIAGNOSIS — I639 Cerebral infarction, unspecified: Secondary | ICD-10-CM | POA: Diagnosis not present

## 2021-01-25 DIAGNOSIS — E782 Mixed hyperlipidemia: Secondary | ICD-10-CM | POA: Diagnosis present

## 2021-01-25 DIAGNOSIS — Z86718 Personal history of other venous thrombosis and embolism: Secondary | ICD-10-CM | POA: Diagnosis not present

## 2021-01-25 DIAGNOSIS — Z79890 Hormone replacement therapy (postmenopausal): Secondary | ICD-10-CM | POA: Diagnosis not present

## 2021-01-25 DIAGNOSIS — E039 Hypothyroidism, unspecified: Secondary | ICD-10-CM | POA: Diagnosis not present

## 2021-01-25 DIAGNOSIS — Z7901 Long term (current) use of anticoagulants: Secondary | ICD-10-CM | POA: Diagnosis not present

## 2021-01-25 DIAGNOSIS — F17210 Nicotine dependence, cigarettes, uncomplicated: Secondary | ICD-10-CM | POA: Diagnosis present

## 2021-01-25 DIAGNOSIS — I700 Atherosclerosis of aorta: Secondary | ICD-10-CM | POA: Diagnosis present

## 2021-01-25 DIAGNOSIS — G459 Transient cerebral ischemic attack, unspecified: Secondary | ICD-10-CM

## 2021-01-25 DIAGNOSIS — R200 Anesthesia of skin: Secondary | ICD-10-CM | POA: Diagnosis present

## 2021-01-25 DIAGNOSIS — Z20822 Contact with and (suspected) exposure to covid-19: Secondary | ICD-10-CM | POA: Diagnosis present

## 2021-01-25 DIAGNOSIS — Z808 Family history of malignant neoplasm of other organs or systems: Secondary | ICD-10-CM

## 2021-01-25 DIAGNOSIS — I10 Essential (primary) hypertension: Secondary | ICD-10-CM

## 2021-01-25 HISTORY — DX: Nontoxic single thyroid nodule: E04.1

## 2021-01-25 LAB — CBG MONITORING, ED
Glucose-Capillary: 66 mg/dL — ABNORMAL LOW (ref 70–99)
Glucose-Capillary: 150 mg/dL — ABNORMAL HIGH (ref 70–99)

## 2021-01-25 LAB — URINALYSIS, ROUTINE W REFLEX MICROSCOPIC
Bacteria, UA: NONE SEEN
Bilirubin Urine: NEGATIVE
Glucose, UA: 150 mg/dL — AB
Hgb urine dipstick: NEGATIVE
Ketones, ur: NEGATIVE mg/dL
Leukocytes,Ua: NEGATIVE
Nitrite: NEGATIVE
Protein, ur: 30 mg/dL — AB
Specific Gravity, Urine: 1.035 — ABNORMAL HIGH (ref 1.005–1.030)
pH: 5 (ref 5.0–8.0)

## 2021-01-25 LAB — APTT: aPTT: 32 seconds (ref 24–36)

## 2021-01-25 LAB — CBC
HCT: 43 % (ref 39.0–52.0)
Hemoglobin: 14.4 g/dL (ref 13.0–17.0)
MCH: 28.7 pg (ref 26.0–34.0)
MCHC: 33.5 g/dL (ref 30.0–36.0)
MCV: 85.7 fL (ref 80.0–100.0)
Platelets: 200 10*3/uL (ref 150–400)
RBC: 5.02 MIL/uL (ref 4.22–5.81)
RDW: 13.7 % (ref 11.5–15.5)
WBC: 5 10*3/uL (ref 4.0–10.5)
nRBC: 0 % (ref 0.0–0.2)

## 2021-01-25 LAB — DIFFERENTIAL
Abs Immature Granulocytes: 0.01 10*3/uL (ref 0.00–0.07)
Basophils Absolute: 0 10*3/uL (ref 0.0–0.1)
Basophils Relative: 1 %
Eosinophils Absolute: 0.1 10*3/uL (ref 0.0–0.5)
Eosinophils Relative: 1 %
Immature Granulocytes: 0 %
Lymphocytes Relative: 30 %
Lymphs Abs: 1.5 10*3/uL (ref 0.7–4.0)
Monocytes Absolute: 0.3 10*3/uL (ref 0.1–1.0)
Monocytes Relative: 7 %
Neutro Abs: 3.1 10*3/uL (ref 1.7–7.7)
Neutrophils Relative %: 61 %

## 2021-01-25 LAB — RAPID URINE DRUG SCREEN, HOSP PERFORMED
Amphetamines: NOT DETECTED
Barbiturates: NOT DETECTED
Benzodiazepines: NOT DETECTED
Cocaine: NOT DETECTED
Opiates: NOT DETECTED
Tetrahydrocannabinol: POSITIVE — AB

## 2021-01-25 LAB — COMPREHENSIVE METABOLIC PANEL
ALT: 28 U/L (ref 0–44)
AST: 27 U/L (ref 15–41)
Albumin: 4.3 g/dL (ref 3.5–5.0)
Alkaline Phosphatase: 102 U/L (ref 38–126)
Anion gap: 6 (ref 5–15)
BUN: 11 mg/dL (ref 6–20)
CO2: 28 mmol/L (ref 22–32)
Calcium: 9.7 mg/dL (ref 8.9–10.3)
Chloride: 104 mmol/L (ref 98–111)
Creatinine, Ser: 1.45 mg/dL — ABNORMAL HIGH (ref 0.61–1.24)
GFR, Estimated: 56 mL/min — ABNORMAL LOW (ref >60)
Glucose, Bld: 113 mg/dL — ABNORMAL HIGH (ref 70–99)
Potassium: 4.1 mmol/L (ref 3.5–5.1)
Sodium: 138 mmol/L (ref 135–145)
Total Bilirubin: 1.1 mg/dL (ref 0.3–1.2)
Total Protein: 8.2 g/dL — ABNORMAL HIGH (ref 6.5–8.1)

## 2021-01-25 LAB — PROTIME-INR
INR: 1.1 (ref 0.8–1.2)
Prothrombin Time: 13.6 seconds (ref 11.4–15.2)

## 2021-01-25 LAB — RESP PANEL BY RT-PCR (FLU A&B, COVID) ARPGX2
Influenza A by PCR: NEGATIVE
Influenza B by PCR: NEGATIVE
SARS Coronavirus 2 by RT PCR: NEGATIVE

## 2021-01-25 LAB — ETHANOL: Alcohol, Ethyl (B): <10 mg/dL (ref <10)
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
## 2021-01-25 MED ORDER — DEXTROSE 50 % IV SOLN: 1.0000 | Freq: Once | INTRAVENOUS | Status: AC

## 2021-01-25 MED ORDER — IOHEXOL 350 MG/ML SOLN
100.0000 mL | Freq: Once | INTRAVENOUS | Status: AC | PRN
  Administered 2021-01-25: 100 mL via INTRAVENOUS

## 2021-01-25 MED ORDER — DEXTROSE 50 % IV SOLN
INTRAVENOUS | Status: AC
  Administered 2021-01-25: 50 mL via INTRAVENOUS
  Filled 2021-01-25: qty 50
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
## 2021-01-25 NOTE — ED Provider Notes (Signed)
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 3. 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 4. These results were called by telephone at the time of interpretation on 01/25/2021 at 7:38 pm to provider [facility] , who verbally acknowledged these results. Electronically Signed   By: Nelson Chimes M.D.   On: 01/25/2021 19:41    Procedures .Critical Care Performed by: Noemi Chapel, MD Authorized by: Noemi Chapel, MD   Critical care provider statement:    Critical care time (minutes):  35   Critical care time was exclusive of:  Separately billable procedures and treating other patients and teaching time   Critical care was necessary to treat or prevent imminent or life-threatening deterioration of the following conditions:  CNS failure or compromise   Critical care was time spent personally by me on the following activities:  Blood draw for specimens, development of treatment plan with patient or surrogate, discussions with consultants, evaluation of patient's response to treatment, examination of patient, obtaining history from patient or surrogate,  ordering  and performing treatments and interventions, ordering and review of laboratory studies, ordering and review of radiographic studies, pulse oximetry, re-evaluation of patient's condition and review of old charts Comments:           Medications Ordered in ED Medications  iohexol (OMNIPAQUE) 350 MG/ML injection 100 mL (100 mLs Intravenous Contrast Given 01/25/21 1944)  dextrose 50 % solution 50 mL (50 mLs Intravenous Given 01/25/21 1919)    ED Course  I have reviewed the triage vital signs and the nursing notes.  Pertinent labs & imaging results that were available during my care of the patient were reviewed by me and considered in my medical decision making (see chart for details).  Clinical Course as of 01/25/21 2034  Sat Jan 25, 2021  1940 Discussed the case with radiology at 7:38 PM, the patient has had a completed stroke back in December, he has new areas of significant stroke that have occurred sometime in the last couple of days.  Given the perfusion study and the findings on the CT scans it is unlikely that he would be within a window for clot retrieval. [BM]    Clinical Course User Index [BM] Noemi Chapel, MD   MDM Rules/Calculators/A&P                          The patient has acute neurologic deficits, he has vital signs reflecting hypertension at 170/93, he is already on Eliquis and he has been on the 4-1/2-hour mark making him not a candidate for TPA therapy.  He may still be a candidate for has a large vessel occlusion, will consult with neurology, code stroke activated immediately when I saw the patient.  He will go to CT scan, he has an 18-gauge IV in the right antecubital fossa.  Last took Eliquis approximately 8 hours ago  Recommendations of teleneurologist is to admit for MRI and echocardiogram.  Discussed with hospitalist at 8:30 PM, they request that I call the neurologist at [facility] to have them consult for ongoing care, I will make that call.  Dr. Josephine Cables  will arrange for transfer to facility where MRIs available  Final Clinical Impression(s) / ED Diagnoses Final diagnoses:  Acute ischemic stroke ([place])  Primary hypertension  Hypoglycemia      Noemi Chapel, MD 01/25/21 2034

## 2021-01-25 NOTE — Progress Notes (Signed)
1910 call time 1905 beeper time 1919 exam started 1921 exam finished 1921 images sent to [place] exam completed in [facility] radiology called
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
## 2021-01-25 NOTE — ED Notes (Signed)
Pt taken to CT by Lenord Fellers, RN

## 2021-01-25 NOTE — H&P (Signed)
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 swallow eval by nursing prior to diet Continue statin and Eliquis (after MRI of brain) Patient will be admitted to [facility] due to no MRI at [facility] on weekends  Hypoglycemia Blood glucose was initially at 66, IV D50 was given Continue CBGs and hypoglycemic protocol  Incidental finding of thyroid nodule CT angiography of head and neck showed 71mm left thyroid nodule with cystic area and calcification Thyroid ultrasound recommended  Hypothyroidism Continue Synthroid  Essential hypertension Hold BP meds.  Permissive hypertension will be allowed at this time  Mixed hyperlipidemia Continue statin  History of DVT Resume Eliquis after MRI of brain rules out intracranial hemorrhage  DVT prophylaxis: SCDs  Code Status: Full code  Family Communication: None at bedside  Disposition Plan:  Patient is from:                        home Anticipated DC to:                   SNF or family members home Anticipated DC date:               2-3 days Anticipated DC barriers:          Patient requires inpatient management with pending neurology consult due to acute extension of present stroke   Consults called:  Neurology  Admission status: Inpatient    Bernadette Hoit MD [facility]  01/25/2021, 10:23 PM

## 2021-01-26 ENCOUNTER — Inpatient Hospital Stay (HOSPITAL_COMMUNITY): Payer: Medicare Other

## 2021-01-26 DIAGNOSIS — I63442 Cerebral infarction due to embolism of left cerebellar artery: Secondary | ICD-10-CM

## 2021-01-26 DIAGNOSIS — I639 Cerebral infarction, unspecified: Secondary | ICD-10-CM

## 2021-01-26 DIAGNOSIS — I6350 Cerebral infarction due to unspecified occlusion or stenosis of unspecified cerebral artery: Secondary | ICD-10-CM

## 2021-01-26 LAB — COMPREHENSIVE METABOLIC PANEL
ALT: 24 U/L (ref 0–44)
AST: 25 U/L (ref 15–41)
Albumin: 3.7 g/dL (ref 3.5–5.0)
Alkaline Phosphatase: 95 U/L (ref 38–126)
Anion gap: 11 (ref 5–15)
BUN: 8 mg/dL (ref 6–20)
CO2: 27 mmol/L (ref 22–32)
Calcium: 9.5 mg/dL (ref 8.9–10.3)
Chloride: 101 mmol/L (ref 98–111)
Creatinine, Ser: 1.48 mg/dL — ABNORMAL HIGH (ref 0.61–1.24)
GFR, Estimated: 55 mL/min — ABNORMAL LOW (ref >60)
Glucose, Bld: 107 mg/dL — ABNORMAL HIGH (ref 70–99)
Potassium: 3.6 mmol/L (ref 3.5–5.1)
Sodium: 139 mmol/L (ref 135–145)
Total Bilirubin: 1 mg/dL (ref 0.3–1.2)
Total Protein: 6.9 g/dL (ref 6.5–8.1)

## 2021-01-26 LAB — LIPID PANEL
Cholesterol: 116 mg/dL (ref 0–200)
HDL: 45 mg/dL (ref >40)
LDL Cholesterol: 54 mg/dL (ref 0–99)
Total CHOL/HDL Ratio: 2.6 RATIO
Triglycerides: 85 mg/dL (ref <150)
VLDL: 17 mg/dL (ref 0–40)

## 2021-01-26 LAB — GLUCOSE, CAPILLARY
Glucose-Capillary: 101 mg/dL — ABNORMAL HIGH (ref 70–99)
Glucose-Capillary: 107 mg/dL — ABNORMAL HIGH (ref 70–99)
Glucose-Capillary: 50 mg/dL — ABNORMAL LOW (ref 70–99)
Glucose-Capillary: 77 mg/dL (ref 70–99)
Glucose-Capillary: 89 mg/dL (ref 70–99)
Glucose-Capillary: 90 mg/dL (ref 70–99)
Glucose-Capillary: 93 mg/dL (ref 70–99)

## 2021-01-26 LAB — CBC
HCT: 40.2 % (ref 39.0–52.0)
Hemoglobin: 13.6 g/dL (ref 13.0–17.0)
MCH: 28.3 pg (ref 26.0–34.0)
MCHC: 33.8 g/dL (ref 30.0–36.0)
MCV: 83.8 fL (ref 80.0–100.0)
Platelets: 198 10*3/uL (ref 150–400)
RBC: 4.8 MIL/uL (ref 4.22–5.81)
RDW: 13.3 % (ref 11.5–15.5)
WBC: 5 10*3/uL (ref 4.0–10.5)
nRBC: 0 % (ref 0.0–0.2)

## 2021-01-26 LAB — SEDIMENTATION RATE: Sed Rate: 13 mm/hr (ref 0–16)

## 2021-01-26 LAB — PROTIME-INR
INR: 1.1 (ref 0.8–1.2)
Prothrombin Time: 14 seconds (ref 11.4–15.2)

## 2021-01-26 LAB — RPR: RPR Ser Ql: NONREACTIVE

## 2021-01-26 LAB — MAGNESIUM: Magnesium: 1.9 mg/dL (ref 1.7–2.4)

## 2021-01-26 LAB — HEMOGLOBIN A1C
Hgb A1c MFr Bld: 5.9 % — ABNORMAL HIGH (ref 4.8–5.6)
Mean Plasma Glucose: 122.63 mg/dL

## 2021-01-26 LAB — APTT: aPTT: 31 seconds (ref 24–36)

## 2021-01-26 LAB — PHOSPHORUS: Phosphorus: 3 mg/dL (ref 2.5–4.6)

## 2021-01-26 IMAGING — US US THYROID
1 series · 13 of 25 positions shown · non-contrast
Comparison: None.

CLINICAL DATA: Palpable abnormality.  Palpable thyroid nodule.

EXAM:
THYROID ULTRASOUND
TECHNIQUE: Ultrasound examination of the thyroid gland and adjacent soft
tissues was performed.

[Series 1: us thyroid · 13 of 84 slices shown]
[im 1/84]
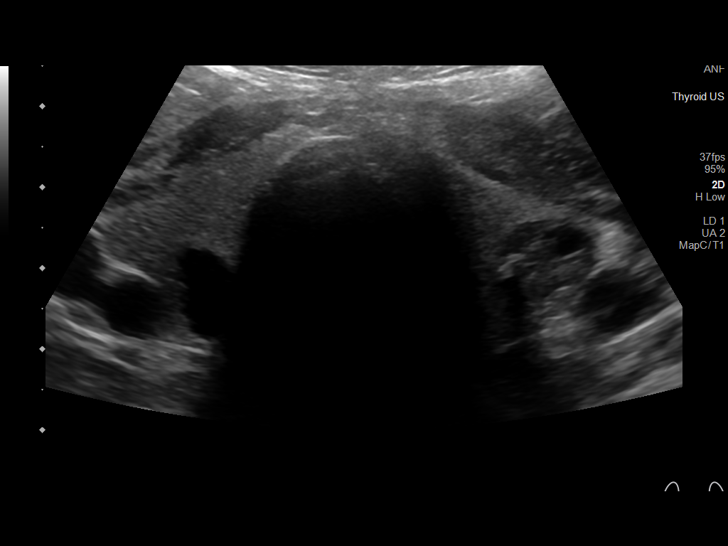
[im 7/84]
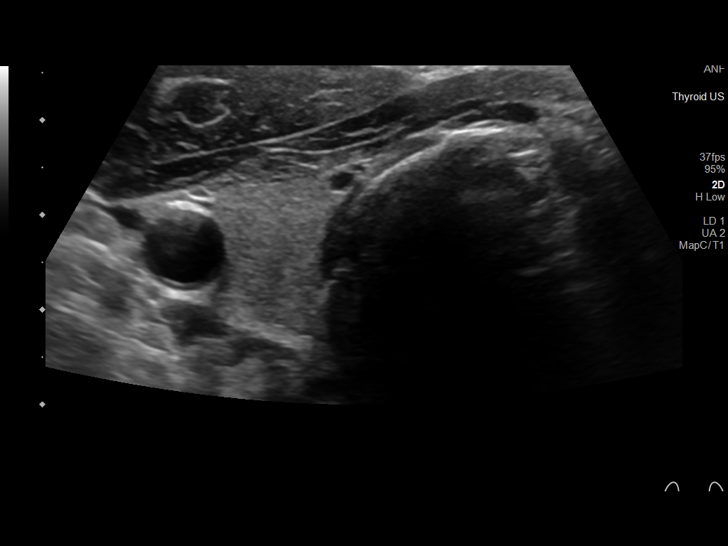
[im 14/84]
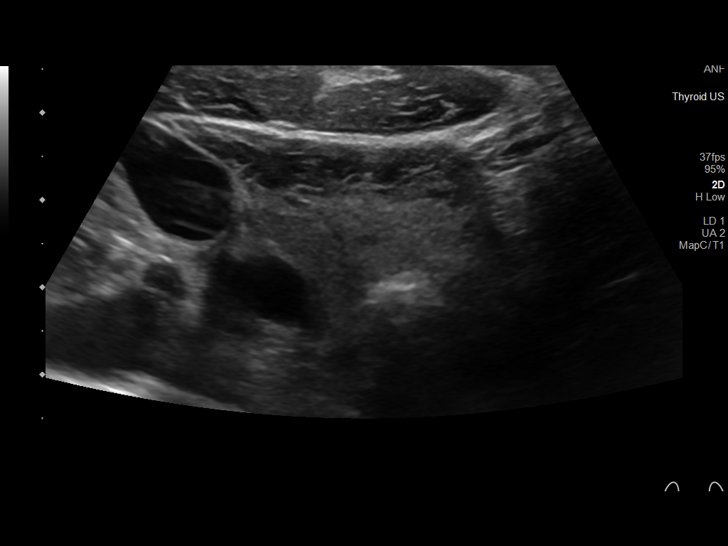
[im 21/84]
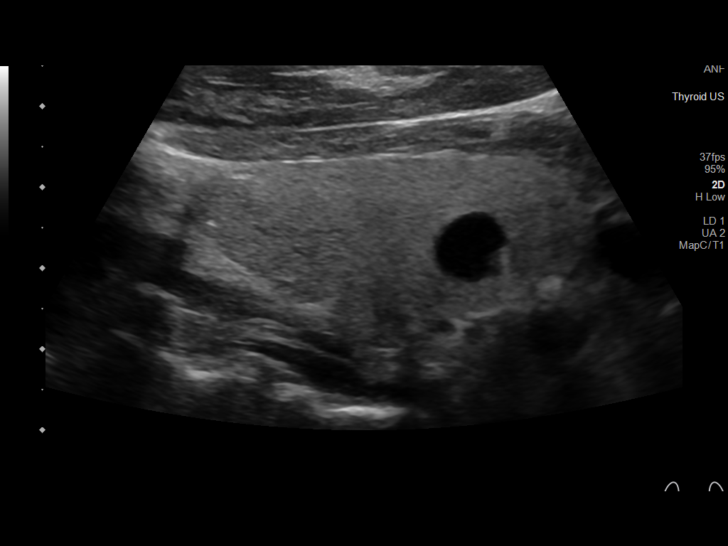
[im 28/84]
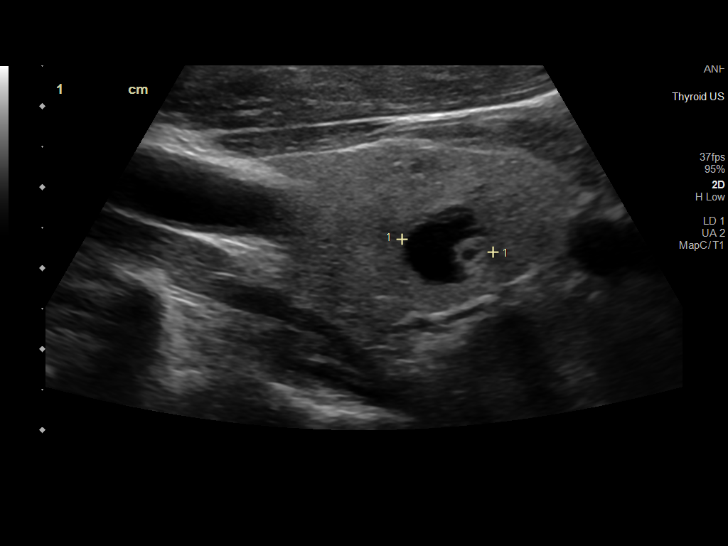
[im 35/84]
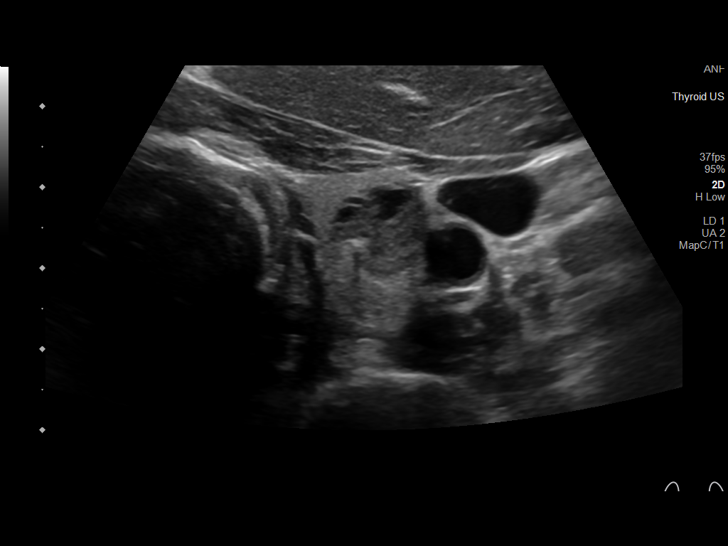
[im 42/84]
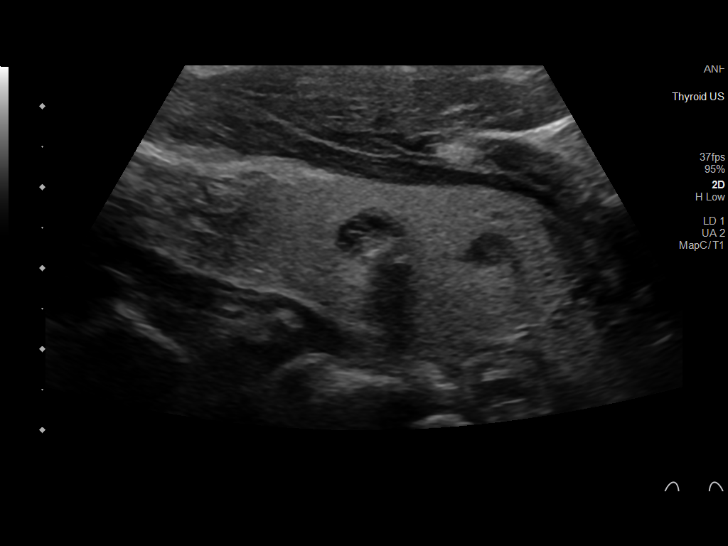
[im 49/84]
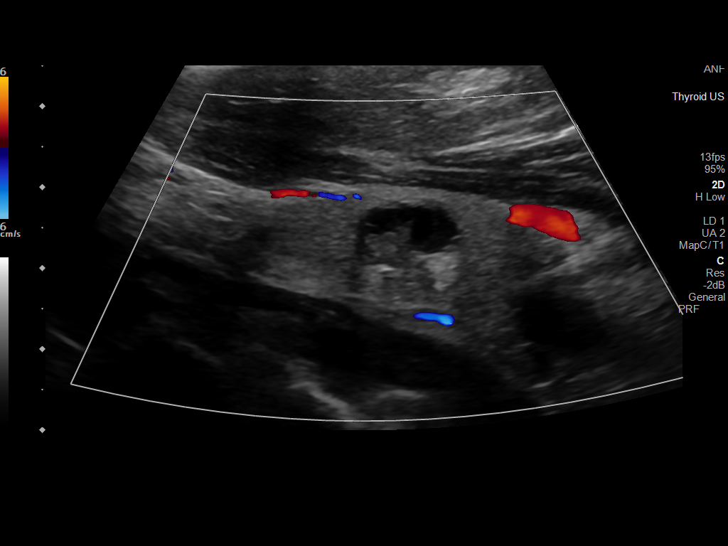
[im 56/84]
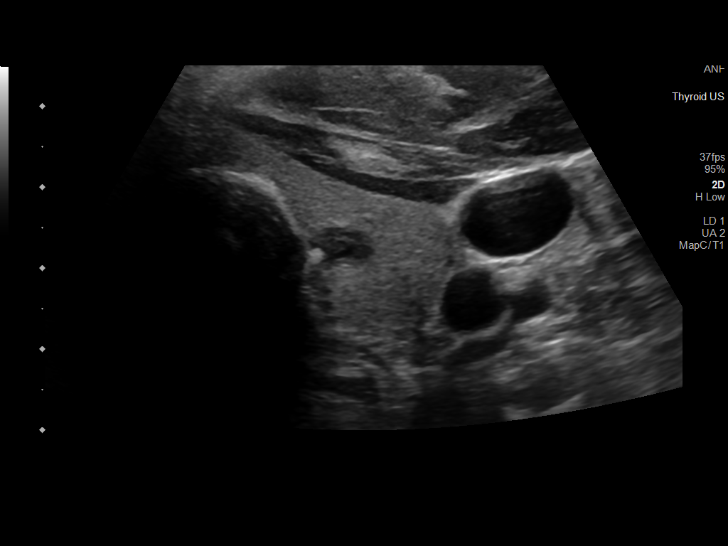
[im 63/84]
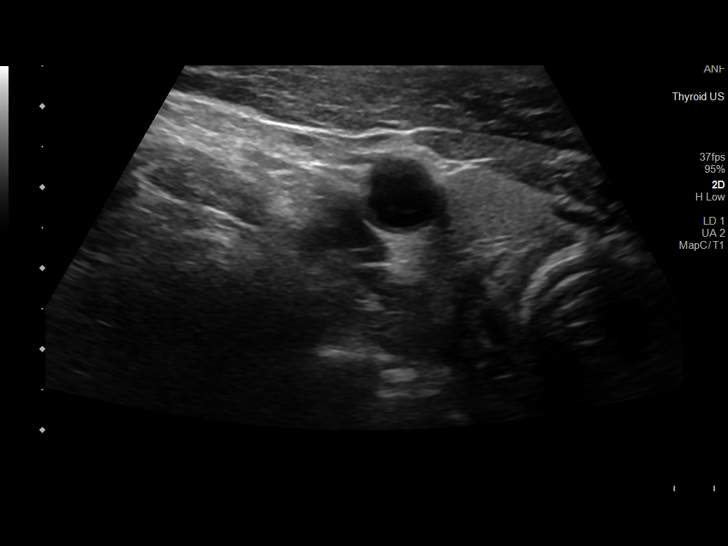
[im 70/84]
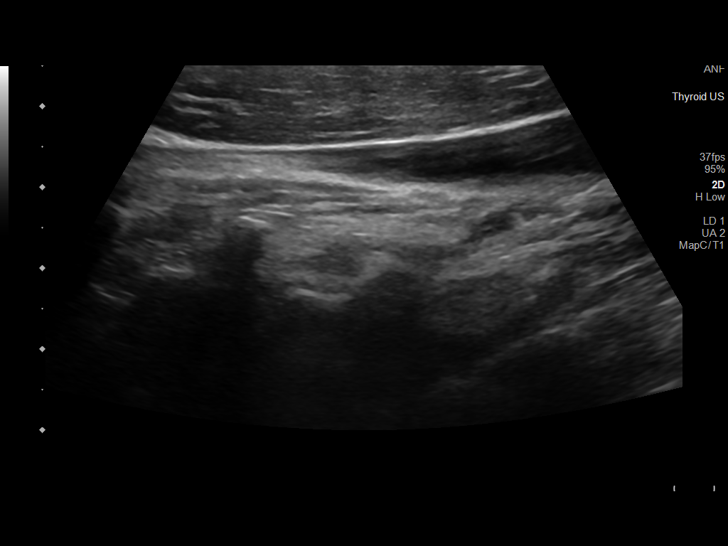
[im 77/84]
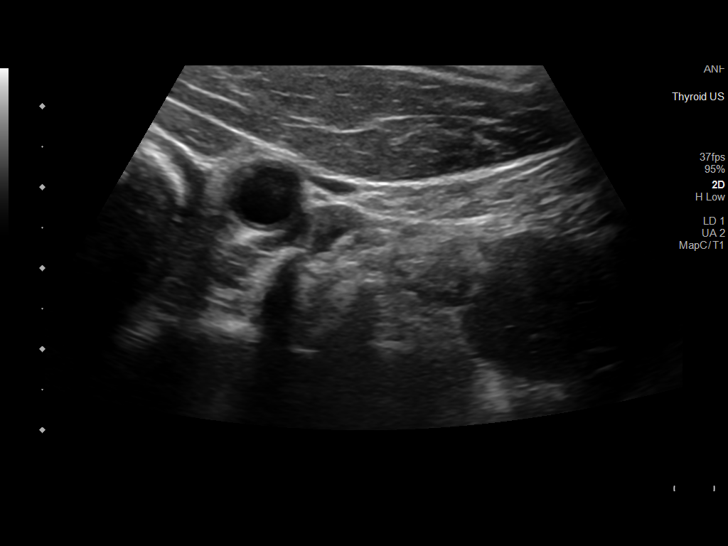
[im 84/84]
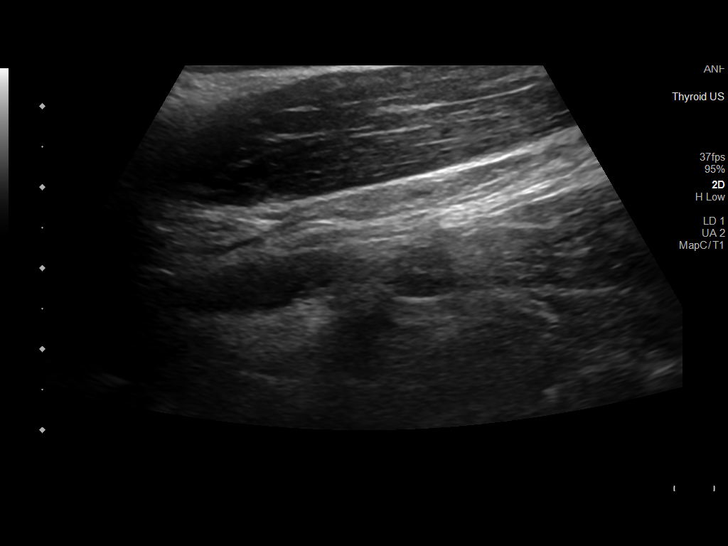

[13 of 25 positions shown; findings below may reference images not displayed]

FINDINGS: Parenchymal Echotexture: Normal

Isthmus: Normal in size measuring 0.4 cm in diameter

Right lobe: Normal in size measuring 5.3 x 2.3 x 1.9 cm

Left lobe: Normal in size measuring 4.6 x 2.4 x 1.9 cm

_________________________________________________________

Estimated total number of nodules >/= 1 cm: 2

Number of spongiform nodules >/=  2 cm not described below (TR1): 0

Number of mixed cystic and solid nodules >/= 1.5 cm not described
below (TR2): 0

_________________________________________________________

There is an approximately 1.2 x 1.0 x 0.9 cm minimally complex cyst
within the mid aspect of the right lobe of the thyroid (labeled 1),
which does not meet criteria to recommend percutaneous sampling or
continued dedicated follow-up.

_________________________________________________________

Nodule # 2:

Location: Left; Mid

Maximum size: 1.3 cm; Other 2 dimensions: 1.2 x 0.7 cm

Composition: solid/almost completely solid (2)

Echogenicity: isoechoic (1)

Shape: not taller-than-wide (0)

Margins: smooth (0)

Echogenic foci: macrocalcifications (1)

ACR TI-RADS total points: 4.

ACR TI-RADS risk category: TR4 (4-6 points).

ACR TI-RADS recommendations:

*Given size (>/= 1 - 1.4 cm) and appearance, a follow-up ultrasound
in 1 year should be considered based on TI-RADS criteria.

_________________________________________________________

There is an approximately 0.9 x 0.7 x 0.6 cm minimally complex cyst
within the mid, medial aspect the left lobe of the thyroid which
contains an internal macrocalcification however does not meet
criteria to recommend percutaneous sampling or continued dedicated
follow-up.
IMPRESSION: 1. Findings suggestive of multinodular goiter.
2. Nodule #2 meets imaging criteria to recommend a 1 year follow-up
as indicated.

The above is in keeping with the ACR TI-RADS recommendations - [HOSPITAL] [[facility]];[DATE].

## 2021-01-26 IMAGING — MR MR HEAD W/O CM
14 of 15 series · 45 of 48 positions shown · non-contrast
Comparison: CT studies done tonight.

CLINICAL DATA: Left leg numbness beginning today.

EXAM:
MRI HEAD WITHOUT CONTRAST
TECHNIQUE: Multiplanar, multiecho pulse sequences of the brain and surrounding
structures were obtained without intravenous contrast.

[Series 5: DWI · axial · 3.0mm · 0.88mm/px · z∈[-48,+98]mm · 7 of 100 slices shown (1 of 6)]
[im 1/100]
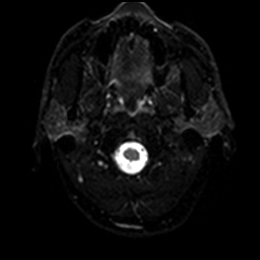
[im 17/100]
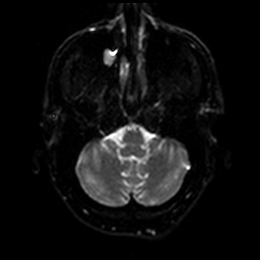
[im 34/100]
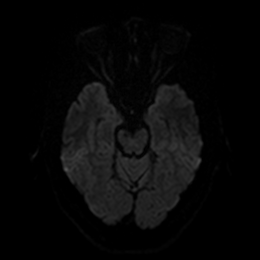
[im 50/100]
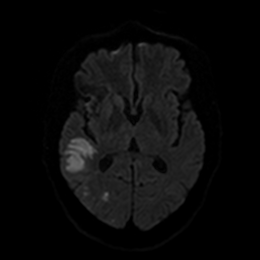
[im 67/100]
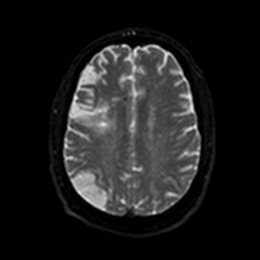
[im 83/100]
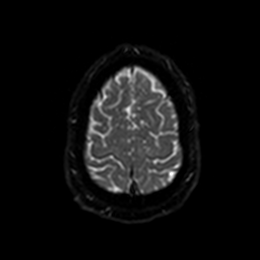
[im 100/100]
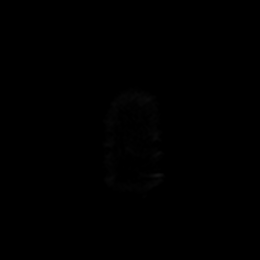

[Series 6: DWI · axial · 3.0mm · 0.88mm/px · z∈[-48,+95]mm · 3 of 49 slices shown (2 of 6)]
[im 1/49]
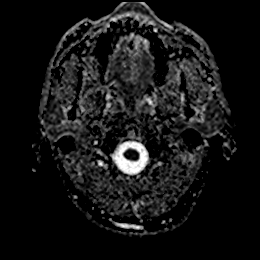
[im 25/49]
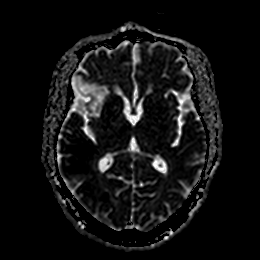
[im 49/49]
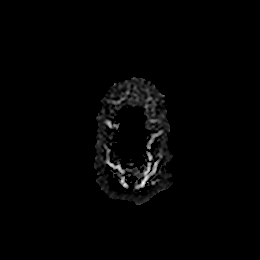

[Series 7: T1 · sagittal · 5.0mm · 0.75mm/px · 2 of 25 slices shown]
[im 1/25]
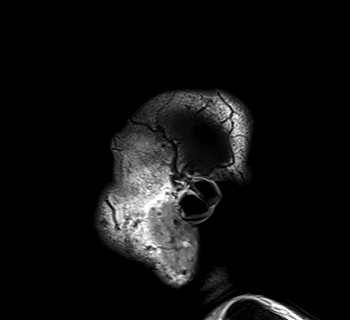
[im 25/25]
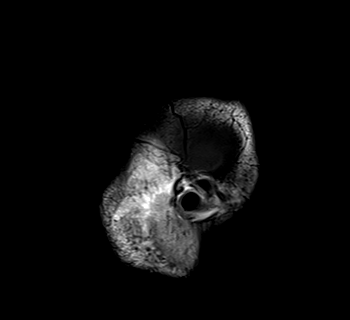

[Series 8: DWI · coronal · 4.0mm · 0.88mm/px · 4 of 68 slices shown (3 of 6)]
[im 1/68]
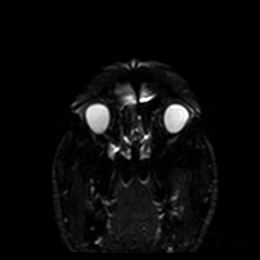
[im 23/68]
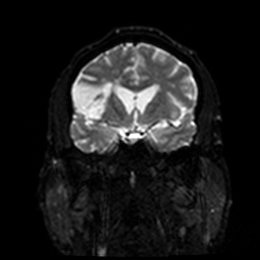
[im 45/68]
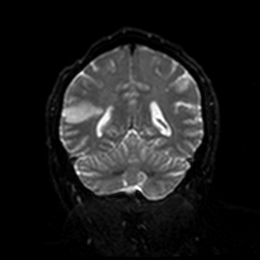
[im 68/68]
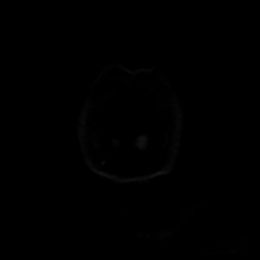

[Series 9: DWI · coronal · 4.0mm · 0.88mm/px · 2 of 34 slices shown (4 of 6)]
[im 1/34]
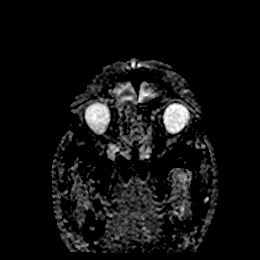
[im 34/34]
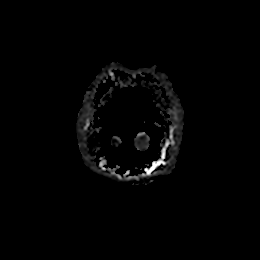

[Series 11: FLAIR · axial · 5.0mm · 0.90mm/px · z∈[-53,+96]mm · 2 of 26 slices shown]
[im 1/26]
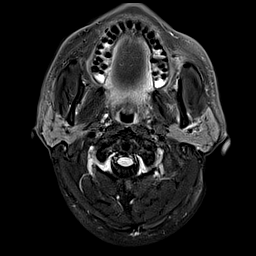
[im 26/26]
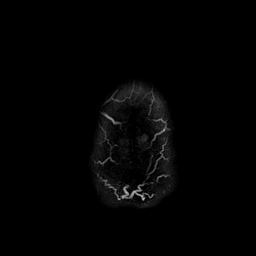

[Series 12: T2 · axial · 5.0mm · 0.72mm/px · z∈[-53,+102]mm · 2 of 27 slices shown (1 of 2)]
[im 1/27]
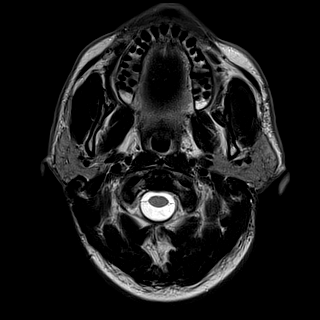
[im 27/27]
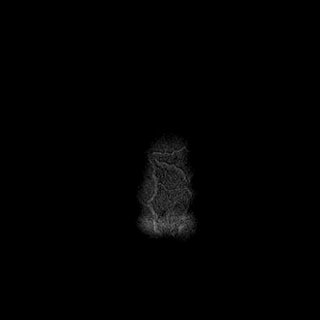

[Series 13: mag_images · axial · 3.0mm · 0.90mm/px · z∈[-55,+108]mm · 3 of 56 slices shown]
[im 1/56]
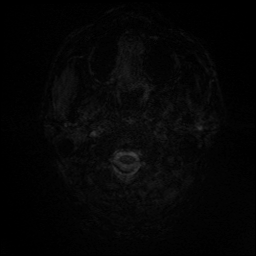
[im 28/56]
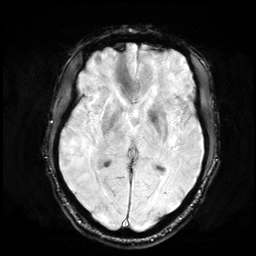
[im 56/56]
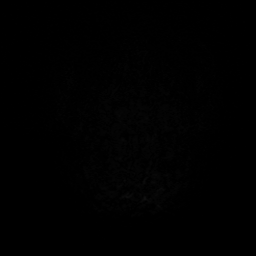

[Series 14: pha_images · axial · 3.0mm · 0.90mm/px · z∈[-55,+108]mm · 3 of 56 slices shown]
[im 1/56]
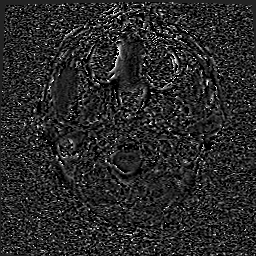
[im 28/56]
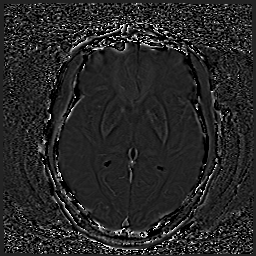
[im 56/56]
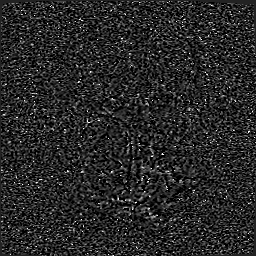

[Series 15: swi_images · axial · 3.0mm · 0.90mm/px · z∈[-55,+108]mm · 3 of 56 slices shown]
[im 1/56]
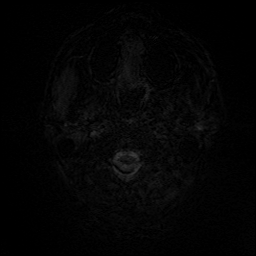
[im 28/56]
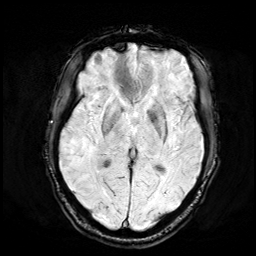
[im 56/56]
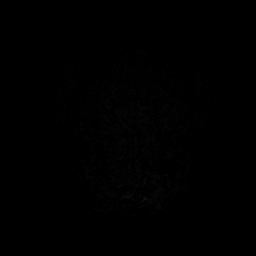

[Series 16: mip_images(sw) · axial · 24.0mm · 0.90mm/px · z∈[-45,+98]mm · 3 of 49 slices shown]
[im 1/49]
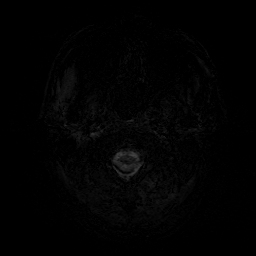
[im 25/49]
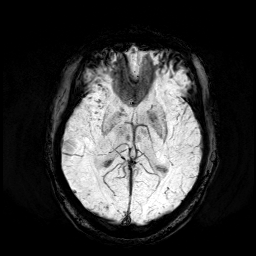
[im 49/49]
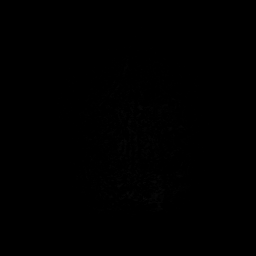

[Series 18: T2 · coronal · 5.0mm · 0.72mm/px · 2 of 32 slices shown (2 of 2)]
[im 1/32]
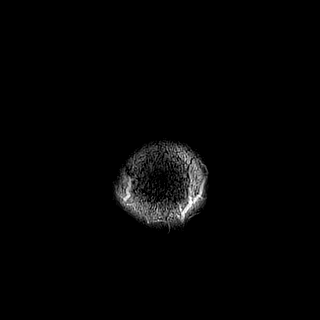
[im 32/32]
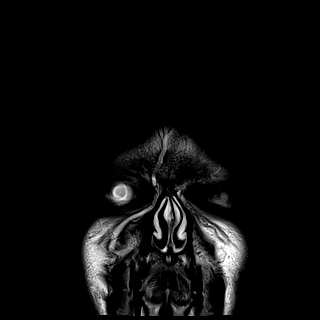

[Series 19: DWI · axial · 1.5mm · 0.88mm/px · z∈[-69,+4]mm · 6 of 100 slices shown (5 of 6)]
[im 1/100]
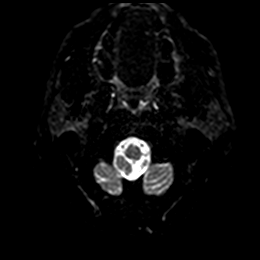
[im 20/100]
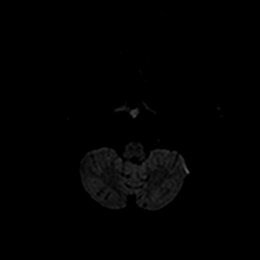
[im 40/100]
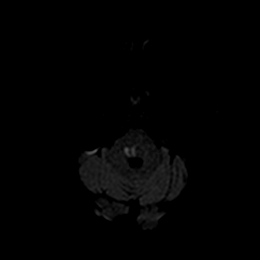
[im 60/100]
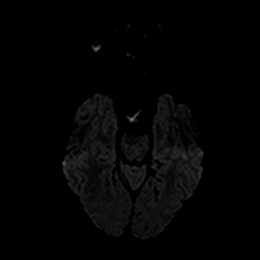
[im 80/100]
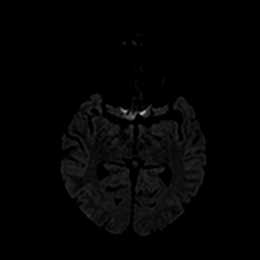
[im 100/100]
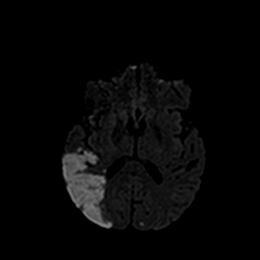

[Series 20: DWI · axial · 1.5mm · 0.88mm/px · z∈[-69,+4]mm · 3 of 50 slices shown (6 of 6)]
[im 1/50]
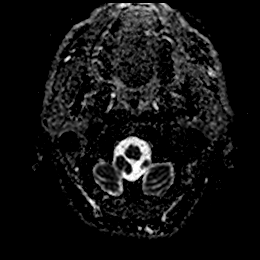
[im 25/50]
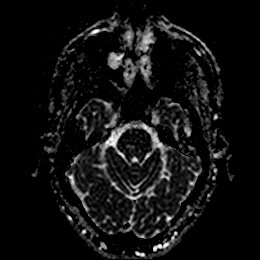
[im 50/50]
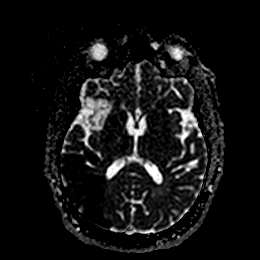

[45 of 48 positions shown; findings below may reference images not displayed]

FINDINGS: Brain: Diffusion imaging shows acute infarction in the right
posterior temporal and parietal lobe as shown by CT, affecting the
area measuring approximately 7.5 x 3.5 x 3.7 cm (volume = 51
cm^3). The affected brain shows mild swelling but there is no
hemorrhagic transformation. No significant mass effect. No midline
shift.

Previous old infarction in the right insula and frontal operculum
has progressed to atrophy, encephalomalacia and gliosis. Minimal
petechial blood products in that region.

Elsewhere, there chronic small-vessel ischemic changes of the pons
with numerous old small vessel cerebellar infarctions. Mild small
vessel ischemic changes noted within the hemispheric white matter
elsewhere. No cortical infarction in the left hemisphere.

No hydrocephalus or extra-axial collection.

Vascular: Major vessels at the base of the brain show flow.

Skull and upper cervical spine: Negative

Sinuses/Orbits: Clear/normal.  Dysconjugate gaze is noted.

Other: None
IMPRESSION: 1. Acute infarction in the right posterior temporal and parietal
lobe consistent with inferior division MCA infarction. Mild swelling
but no hemorrhage or mass effect. Region of infarction probably on
the order 30-50 cc in volume, more than was estimated by perfusion
imaging.
2. Old infarction in the right insula and frontal operculum which
has progressed to atrophy, encephalomalacia and gliosis.
3. Chronic small-vessel ischemic changes elsewhere throughout the
brain as outlined above.

## 2021-01-26 MED ORDER — ATORVASTATIN CALCIUM 40 MG PO TABS
40.0000 mg | ORAL_TABLET | Freq: Every day | ORAL | Status: DC
  Administered 2021-01-26: 40 mg via ORAL
  Filled 2021-01-26: qty 1

## 2021-01-26 MED ORDER — LEVOTHYROXINE SODIUM 25 MCG PO TABS
25.0000 ug | ORAL_TABLET | Freq: Every day | ORAL | Status: DC
  Administered 2021-01-26 – 2021-01-27 (×2): 25 ug via ORAL
  Filled 2021-01-26 (×2): qty 1

## 2021-01-26 NOTE — Evaluation (Signed)
Physical Therapy Evaluation Patient Details Name: Matthew Wise MRN: [id number] DOB: [birth year] Today's Date: 01/26/2021   History of Present Illness  Pt is a 60 y/o male who presents with new-onset LLE numbness and double vision when looking L. MRI revealed acute infarcts in the R posterior temporal and parietal lobes consistent with inferior division MCA infarct. 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  Clinical Impression  Pt admitted with above diagnosis. Pt currently with functional limitations due to the deficits listed below (see PT Problem List). At the time of PT eval pt was able to perform transfers and ambulation with up to min guard assist and RW for support. Pt very flat throughout eval and was not providing many details. It appeared that ambulation was effortful and gait speed was extremely slow, however when questioned about this, pt denies heaviness in LE's, states "his LE's may be weak" and "maybe he is just being cautious". Pt reports he will be staying with his sister for a few days at discharge. Feel he will need some supervision at d/c for safety with home health PT to follow up. Pt will benefit from skilled PT to increase their independence and safety with mobility to allow discharge to the venue listed below.       Follow Up Recommendations Home health PT;Supervision for mobility/OOB    Equipment Recommendations  Rolling walker with 5" wheels    Recommendations for Other Services       Precautions / Restrictions Precautions Precautions: Fall Restrictions Weight Bearing Restrictions: No      Mobility  Bed Mobility Overal bed mobility: Modified Independent             General bed mobility comments: No assist required for transition to/from EOB. Increased time required to initiate however.     Transfers Overall transfer level: Needs assistance Equipment used: Rolling walker (2 wheeled) Transfers: Sit to/from Stand Sit to Stand: Min guard         General transfer comment: Close guard for safety as pt powered up to full stand. No unsteadines or LOPB noted.  Ambulation/Gait Ambulation/Gait assistance: Min guard Gait Distance (Feet): 75 Feet Assistive device: Rolling walker (2 wheeled) Gait Pattern/deviations: Decreased step length - left;Decreased dorsiflexion - left;WFL(Within Functional Limits);Trunk flexed Gait velocity: decreased   General Gait Details: VC's for improved posture and general safety. Pt with significantly decreased gait speed, and did not make corrective changes with cues.  Stairs            Wheelchair Mobility    Modified Rankin (Stroke Patients Only) Modified Rankin (Stroke Patients Only) Pre-Morbid Rankin Score: Slight disability Modified Rankin: Moderately severe disability     Balance Overall balance assessment: Needs assistance Sitting-balance support: Feet supported;No upper extremity supported Sitting balance-Leahy Scale: Fair     Standing balance support: Bilateral upper extremity supported;During functional activity Standing balance-Leahy Scale: Poor Standing balance comment: Reliant on UE support                             Pertinent Vitals/Pain Pain Assessment: No/denies pain Pain Descriptors / Indicators: Sore Pain Intervention(s): Monitored during session    Home Living Family/patient expects to be discharged to:: Private residence Living Arrangements: Alone Available Help at Discharge: Available PRN/intermittently;Family Type of Home: House       Home Layout: One  level Home Equipment: Other (comment) (Pt reports he might have a walker but is unsure)      Prior Function Level of Independence: Independent         Comments: Pt reports short distance community ambulation without AD and  independent with ADLs     Hand Dominance   Dominant Hand: Right    Extremity/Trunk Assessment   Upper Extremity Assessment LUE Deficits / Details: LUE strength 4-/5 throughout. grip strength slightly decreased compared to RUE. LUE Sensation: WNL LUE Coordination: WNL    Lower Extremity Assessment Lower Extremity Assessment: LLE deficits/detail LLE Deficits / Details: grossly -4/5 except left ankle dorsiflexion 3+/5 (baseline since previous CVA per patient) LLE Sensation: WNL    Cervical / Trunk Assessment Cervical / Trunk Assessment: Other exceptions Cervical / Trunk Exceptions: Forward head posture with rounded shoulders and flexed trunk  Communication   Communication: No difficulties  Cognition Arousal/Alertness: Awake/alert Behavior During Therapy: Flat affect Overall Cognitive Status: Impaired/Different from baseline Area of Impairment: Following commands;Problem solving                       Following Commands: Follows multi-step commands with increased time     Problem Solving: Slow processing;Requires verbal cues;Difficulty sequencing General Comments: pt very flat during session, pt following commands but noted to present with some processing and sequencing deficits      General Comments      Exercises     Assessment/Plan    PT Assessment Patient needs continued PT services  PT Problem List Decreased strength;Decreased activity tolerance;Decreased balance;Decreased mobility;Decreased coordination;Decreased cognition;Decreased knowledge of use of DME;Decreased safety awareness;Decreased knowledge of precautions       PT Treatment Interventions DME instruction;Gait training;Stair training;Functional mobility training;Therapeutic activities;Therapeutic exercise;Neuromuscular re-education;Balance training;Cognitive remediation;Patient/family education    PT Goals (Current goals can be found in the Care Plan section)  Acute Rehab PT Goals Patient  Stated Goal: return home with family to assist PT Goal Formulation: With patient Time For Goal Achievement: 02/02/21 Potential to Achieve Goals: Good    Frequency Min 4X/week   Barriers to discharge        Co-evaluation               AM-PAC PT "6 Clicks" Mobility  Outcome Measure Help needed turning from your back to your side while in a flat bed without using bedrails?: None Help needed moving from lying on your back to sitting on the side of a flat bed without using bedrails?: None Help needed moving to and from a bed to a chair (including a wheelchair)?: A Little Help needed standing up from a chair using your arms (e.g., wheelchair or bedside chair)?: A Little Help needed to walk in hospital room?: A Little Help needed climbing 3-5 steps with a railing? : A Little 6 Click Score: 20    End of Session Equipment Utilized During Treatment: Gait belt Activity Tolerance: Patient limited by fatigue Patient left: in bed;with call bell/phone within reach;with bed alarm set Nurse Communication: Mobility status PT Visit Diagnosis: Muscle weakness (generalized) (M62.81);Other symptoms and signs involving the nervous system (R29.898);Other abnormalities of gait and mobility (R26.89)    Time: 3151-7616 PT Time Calculation (min) (ACUTE ONLY): 23 min   Charges:   PT Evaluation $PT Eval Moderate Complexity: 1 Mod PT Treatments $Gait Training: 8-22 mins        Rolinda Roan, PT, DPT Acute Rehabilitation Services Pager: [phone] Office: [phone]   Thelma Comp 01/26/2021, 10:41  AM

## 2021-01-26 NOTE — Progress Notes (Signed)
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 exercise as appropriate   Hx stroke/TIA  Other Active Problems   Hospital day # 1  Lynnae Sandhoff, MD Page: [phone]   To contact Stroke Continuity provider, please refer to [email or web address] After hours, contact General Neurology
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
## 2021-01-26 NOTE — Evaluation (Addendum)
Clinical/Bedside Swallow Evaluation Patient Details  Name: Matthew Wise MRN: [id number] Date of Birth: 12/02/1960  Today's Date: 01/26/2021 Time: SLP Start Time (ACUTE ONLY): 1028 SLP Stop Time (ACUTE ONLY): 1040 SLP Time Calculation (min) (ACUTE ONLY): 12 min  Past Medical History:  Past Medical History:  Diagnosis Date  . Acute CVA (cerebrovascular accident) ([place]) 09/09/2020  . Chronic anticoagulation 11/14/2014  . CKD (chronic kidney disease) stage 3, GFR 30-59 ml/min (HCC) 10/24/2019  . CKD (chronic kidney disease) stage 3, GFR 30-59 ml/min (HCC) 10/24/2019  . Clotting disorder ([place])   . 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 DVT (deep venous thrombosis) ([place]) 06/15/2011  . DVT of leg (deep venous thrombosis) (HCC)    rt. leg  . Essential hypertension 11/28/2020  . H/O PE (pulmonary embolism) 06/15/2011  . Hyperlipidemia 11/28/2020  . 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 Prediabetes 10/24/2019  . Thyroid nodule 01/25/2021   Past Surgical History:  Past Surgical History:  Procedure Laterality Date  . COLONOSCOPY WITH PROPOFOL N/A 10/29/2015   incomplete due to redundant colon. two simple tubular adenomas removed. patient was advised to go to [facility] for colonoscopy but he did not keep appt.  . TEE WITHOUT CARDIOVERSION N/A 09/10/2020   Procedure: TRANSESOPHAGEAL ECHOCARDIOGRAM (TEE) WITH PROPOFOL;  Surgeon: Satira Sark, MD;  Location: [facility];  Service: Cardiovascular;  Laterality: N/A;   HPI:  Matthew Wise is a 60 y.o. male with medical history significant for stroke, bilateral lower extremity DVT/PE in setting of hypercoagulability disorder, on Eliquis as an outpatient, CKD stage 3A who presents to the emergency department due to numbness in his left leg from the foot to the knee.  MRI reported "Acute infarction in the right posterior temporal and parietal  lobe  consistent with inferior division MCA infarction".   Assessment / Plan / Recommendation Clinical Impression  Pt was seen for a bedside swallow evaluation in the setting of acute infarction and he presents with suspected functional oropharyngeal swallowing abilities.  Pt was encountered awake/alert and was agreeable to this evaluation.  Oral mechanism exam was unremarkable.  Pt consumed trials of thin liquid and regular solids.  Pt fed himself independently and he demonstrated good bolus acceptance, timely mastication, suspected timely [facility] transport/swallow initiation, and consistent hyolaryngeal elevation/excursion to observation and palpation.  No overt s/sx of aspiration were observed with any trials.  Recommend continuation of regular solids and thin liquids with medications administered whole with liquid.  No further skilled ST is warranted at this time.  Please re-consult if additional needs arise.    During this evaluation, pt's cognitive-linguistic abilities appeared grossly WFL per brief, informal screening; however, given his acute infarction, would recommend a comprehensive cognitive-linguistic evaluation if it aligns with MD plan.   SLP Visit Diagnosis: Dysphagia, unspecified (R13.10)    Aspiration Risk  No limitations    Diet Recommendation Thin liquid;Regular   Liquid Administration via: Cup;Straw Medication Administration: Whole meds with liquid Supervision: Patient able to self feed    Other  Recommendations Oral Care Recommendations: Oral care BID   Follow up Recommendations None        Swallow Study   General HPI: Matthew Wise is a 60 y.o. male with medical history significant for stroke, bilateral lower extremity DVT/PE in setting of hypercoagulability disorder, on Eliquis as an outpatient, CKD stage 3A who presents to the emergency department due  to numbness in his left leg from the foot to the knee.  MRI reported "Acute infarction in the right posterior temporal and  parietal  lobe consistent with inferior division MCA infarction". Type of Study: Bedside Swallow Evaluation Previous Swallow Assessment: None Diet Prior to this Study: Regular;Thin liquids Temperature Spikes Noted: No Respiratory Status: Room air History of Recent Intubation: No Behavior/Cognition: Alert;Cooperative;Pleasant mood Oral Cavity Assessment: Within Functional Limits Oral Care Completed by SLP: No Oral Cavity - Dentition: Adequate natural dentition Vision: Functional for self-feeding Self-Feeding Abilities: Able to feed self Patient Positioning: Upright in bed Baseline Vocal Quality: Normal Volitional Swallow: Able to elicit    Oral/Motor/Sensory Function Overall Oral Motor/Sensory Function: Within functional limits   Ice Chips Ice chips: Not tested   Thin Liquid Thin Liquid: Within functional limits    Nectar Thick Nectar Thick Liquid: Not tested   Honey Thick Honey Thick Liquid: Not tested   Puree Puree: Not tested   Solid     Solid: Within functional limits Presentation: Self Fed     Colin Mulders., M.S., [place] [facility] Office: [phone]   Elvia Collum Calleigh Lafontant 01/26/2021,10:43 AM
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
## 2021-01-26 NOTE — Evaluation (Signed)
Occupational Therapy Evaluation Patient Details Name: Matthew Wise MRN: [id number] DOB: 1961-07-01 Today's Date: 01/26/2021    History of Present Illness Pt is a 60 y/o male who presents with new-onset LLE numbness and double vision when looking L. MRI revealed acute infarcts in the R posterior temporal and parietal lobes consistent with inferior division MCA infarct. 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   Clinical Impression   Pt admitted with the above diagnoses and presents with below problem list. Pt will benefit from continued acute OT to address the below listed deficits and maximize independence with basic ADLs prior to d/c to venue below. At baseline, pt is independent with basic ADLs. Pt presents with deficits in vision, cognition, and balance impacting safety and assist level with ADLs. See below for further details. Pt currently is min guard assist with functional transfers and mobility, supervision and cueing for full ADL task completion and safety. Pt plans to d/c to sister's home to stay for awhile and will have 24 hr supervision per pt report (sister is retired). Pt will benefit from further OT after d/c and recommend f/u with an ophthalmologist for formal, full vision assessment.      Follow Up Recommendations  Supervision/Assistance - 24 hour;Home health OT    Equipment Recommendations  Other (comment) (TBD)    Recommendations for Other Services       Precautions / Restrictions Precautions Precautions: Fall Restrictions Weight Bearing Restrictions: No      Mobility Bed Mobility Overal bed mobility: Modified Independent             General bed mobility comments: Decreased intitiation but no assist need. Extra time/effort.    Transfers Overall transfer level: Needs assistance Equipment used:  Rolling walker (2 wheeled) Transfers: Sit to/from Stand Sit to Stand: Min guard         General transfer comment: to/from EOB. utilized rw. min guard for safety. no physical assist needed    Balance Overall balance assessment: Needs assistance Sitting-balance support: Feet supported;No upper extremity supported Sitting balance-Leahy Scale: Fair Sitting balance - Comments: sat EOB a few minutes   Standing balance support: Bilateral upper extremity supported;During functional activity Standing balance-Leahy Scale: Poor Standing balance comment: reliant on external support of rw during transfers/mobility, sink during grooming task in standing                           ADL either performed or assessed with clinical judgement   ADL Overall ADL's : Needs assistance/impaired Eating/Feeding: Set up;Sitting   Grooming: Supervision/safety;Cueing for sequencing;Standing;Min guard Grooming Details (indicate cue type and reason): walked away from sink with water still running. Cues for sequencing/throughly completing each step. Upper Body Bathing: Set up;Supervision/ safety;Sitting   Lower Body Bathing: Min guard;Cueing for sequencing;Sit to/from stand   Upper Body Dressing : Set up;Sitting   Lower Body Dressing: Min guard;Sit to/from stand;Cueing for safety;Cueing for sequencing   Toilet Transfer: Min guard;Ambulation;Comfort height toilet;RW;Cueing for sequencing;Cueing for safety   Toileting- Clothing Manipulation and Hygiene: Min guard;Sit to/from stand;Set up;Sitting/lateral lean   Tub/ Shower Transfer: Min guard;Ambulation;Rolling walker   Functional mobility during ADLs: Min guard;Rolling walker General ADL Comments: Pt compelted 1 grooming task in standing, walked in the room utilizing rw. L side visual impairment and impaired cognition impacting safety and assist level with ADLs.  Vision Baseline Vision/History: Wears glasses Wears Glasses: Reading  only Patient Visual Report: Other (comment) (L eye double vision, gaze palsy) Vision Assessment?: Yes Eye Alignment: Impaired (comment) (disconjugate L eye at rest, able to adduct with effort) Ocular Range of Motion: Within Functional Limits;Other (comment) (somewhat effortful but full ROM) Alignment/Gaze Preference: Other (comment);Chin down;Head turned (prefers looking toward right side at rest. L eye in abduction at rest (disconjugate). Able to adduct L eye with effort) Tracking/Visual Pursuits: Decreased smoothness of eye movement to RIGHT inferior field Saccades: Additional eye shifts occurred during testing;Additional head turns occurred during testing;Overshoots Convergence: Impaired (comment) (effortful but able) Diplopia Assessment: Only with left gaze Depth Perception: Overshoots     Perception Perception Perception Tested?: No   Praxis Praxis Praxis tested?: Deficits Deficits: Organization    Pertinent Vitals/Pain Pain Assessment: No/denies pain     Hand Dominance Right   Extremity/Trunk Assessment Upper Extremity Assessment Upper Extremity Assessment: LUE deficits/detail LUE Deficits / Details: LUE strength 4-/5 throughout. grip strength slightly decreased compared to RUE. LUE Sensation: WNL LUE Coordination: WNL   Lower Extremity Assessment Lower Extremity Assessment: Defer to PT evaluation LLE Deficits / Details: grossly -4/5 except left ankle dorsiflexion 3+/5 (baseline since previous CVA per patient) LLE Sensation: WNL   Cervical / Trunk Assessment Cervical / Trunk Assessment: Other exceptions Cervical / Trunk Exceptions: Forward head posture with rounded shoulders and flexed trunk   Communication Communication Communication: No difficulties   Cognition Arousal/Alertness: Awake/alert Behavior During Therapy: Flat affect Overall Cognitive Status: Impaired/Different from baseline Area of Impairment: Following commands;Problem solving                        Following Commands: Follows multi-step commands with increased time     Problem Solving: Difficulty sequencing;Slow processing;Decreased initiation;Requires verbal cues General Comments: very flat. Decreased initiation at times. Walked away from sink with water still running after brushing teeth. Slow processing.   General Comments       Exercises Other Exercises Other Exercises: discussed eye ROM exercises for strengthening L eye adduction.   Shoulder Instructions      Home Living Family/patient expects to be discharged to:: Private residence Living Arrangements: Alone Available Help at Discharge: Available PRN/intermittently;Family Type of Home: House       Home Layout: One level     Bathroom Shower/Tub: Teacher, early years/pre: Standard Bathroom Accessibility: Yes   Home Equipment: Other (comment) (Pt reports he might have a walker but is unsure)          Prior Functioning/Environment Level of Independence: Independent        Comments: Pt reports short distance community ambulation without AD and independent with ADLs. Has not driven "since the accident" per his report. Plans to stay with sister at d/c.        OT Problem List: Decreased strength;Decreased activity tolerance;Impaired balance (sitting and/or standing);Impaired vision/perception;Decreased cognition;Decreased knowledge of precautions      OT Treatment/Interventions: Self-care/ADL training;Therapeutic exercise;Neuromuscular education;DME and/or AE instruction;Therapeutic activities;Cognitive remediation/compensation;Visual/perceptual remediation/compensation;Balance training;Patient/family education    OT Goals(Current goals can be found in the care plan section) Acute Rehab OT Goals Patient Stated Goal: return home with family to assist OT Goal Formulation: With patient Time For Goal Achievement: 02/09/21 Potential to Achieve Goals: Good ADL Goals Pt Will Perform Grooming:  Independently;standing Pt Will Perform Upper Body Dressing: with set-up;sitting Pt Will Perform Lower Body Dressing: sit to/from stand;with modified independence Pt Will Transfer to Toilet: with modified  independence;ambulating Pt Will Perform Toileting - Clothing Manipulation and hygiene: with modified independence;sit to/from stand Additional ADL Goal #1: Pt will accurately graps 4/5 items placed on L side on first attempt to faciliate safety with ADLs.  OT Frequency: Min 2X/week   Barriers to D/C:            Co-evaluation              AM-PAC OT "6 Clicks" Daily Activity     Outcome Measure Help from another person eating meals?: None Help from another person taking care of personal grooming?: A Little Help from another person toileting, which includes using toliet, bedpan, or urinal?: A Little Help from another person bathing (including washing, rinsing, drying)?: A Little Help from another person to put on and taking off regular upper body clothing?: None Help from another person to put on and taking off regular lower body clothing?: A Little 6 Click Score: 20   End of Session Equipment Utilized During Treatment: Rolling walker  Activity Tolerance: Patient tolerated treatment well;Patient limited by fatigue Patient left: in bed;with call bell/phone within reach;with bed alarm set  OT Visit Diagnosis: Unsteadiness on feet (R26.81);Other abnormalities of gait and mobility (R26.89);Hemiplegia and hemiparesis;Other symptoms and signs involving cognitive function;Other symptoms and signs involving the nervous system (R29.898)                Time: 9450-3888 OT Time Calculation (min): 17 min Charges:  OT General Charges $OT Visit: 1 Visit OT Evaluation $OT Eval Moderate Complexity: [place], OT Acute Rehabilitation Services Pager: [phone] Office: [phone]   Hortencia Pilar 01/26/2021, 12:35 PM

## 2021-01-27 LAB — GLUCOSE, CAPILLARY
Glucose-Capillary: 124 mg/dL — ABNORMAL HIGH (ref 70–99)
Glucose-Capillary: 53 mg/dL — ABNORMAL LOW (ref 70–99)
Glucose-Capillary: 83 mg/dL (ref 70–99)
Glucose-Capillary: 96 mg/dL (ref 70–99)
Glucose-Capillary: 99 mg/dL (ref 70–99)

## 2021-01-27 LAB — CBC WITH DIFFERENTIAL/PLATELET
Abs Immature Granulocytes: 0.01 10*3/uL (ref 0.00–0.07)
Basophils Absolute: 0 10*3/uL (ref 0.0–0.1)
Basophils Relative: 1 %
Eosinophils Absolute: 0.1 10*3/uL (ref 0.0–0.5)
Eosinophils Relative: 2 %
HCT: 38 % — ABNORMAL LOW (ref 39.0–52.0)
Hemoglobin: 13.1 g/dL (ref 13.0–17.0)
Immature Granulocytes: 0 %
Lymphocytes Relative: 38 %
Lymphs Abs: 2 10*3/uL (ref 0.7–4.0)
MCH: 28.5 pg (ref 26.0–34.0)
MCHC: 34.5 g/dL (ref 30.0–36.0)
MCV: 82.8 fL (ref 80.0–100.0)
Monocytes Absolute: 0.6 10*3/uL (ref 0.1–1.0)
Monocytes Relative: 11 %
Neutro Abs: 2.7 10*3/uL (ref 1.7–7.7)
Neutrophils Relative %: 48 %
Platelets: 198 10*3/uL (ref 150–400)
RBC: 4.59 MIL/uL (ref 4.22–5.81)
RDW: 13.6 % (ref 11.5–15.5)
WBC: 5.4 10*3/uL (ref 4.0–10.5)
nRBC: 0 % (ref 0.0–0.2)

## 2021-01-27 LAB — COMPREHENSIVE METABOLIC PANEL
ALT: 23 U/L (ref 0–44)
AST: 25 U/L (ref 15–41)
Albumin: 3.8 g/dL (ref 3.5–5.0)
Alkaline Phosphatase: 93 U/L (ref 38–126)
Anion gap: 9 (ref 5–15)
BUN: 12 mg/dL (ref 6–20)
CO2: 28 mmol/L (ref 22–32)
Calcium: 9.6 mg/dL (ref 8.9–10.3)
Chloride: 102 mmol/L (ref 98–111)
Creatinine, Ser: 1.51 mg/dL — ABNORMAL HIGH (ref 0.61–1.24)
GFR, Estimated: 53 mL/min — ABNORMAL LOW (ref >60)
Glucose, Bld: 122 mg/dL — ABNORMAL HIGH (ref 70–99)
Potassium: 3.3 mmol/L — ABNORMAL LOW (ref 3.5–5.1)
Sodium: 139 mmol/L (ref 135–145)
Total Bilirubin: 1.1 mg/dL (ref 0.3–1.2)
Total Protein: 7.2 g/dL (ref 6.5–8.1)

## 2021-01-27 LAB — MAGNESIUM: Magnesium: 2.2 mg/dL (ref 1.7–2.4)

## 2021-01-27 LAB — PHOSPHORUS: Phosphorus: 2.9 mg/dL (ref 2.5–4.6)

## 2021-01-27 MED ORDER — POTASSIUM CHLORIDE CRYS ER 20 MEQ PO TBCR
40.0000 meq | EXTENDED_RELEASE_TABLET | Freq: Once | ORAL | Status: AC
  Administered 2021-01-27: 40 meq via ORAL
  Filled 2021-01-27: qty 2

## 2021-01-27 MED ORDER — APIXABAN 5 MG PO TABS
5.0000 mg | ORAL_TABLET | Freq: Two times a day (BID) | ORAL | Status: DC
  Filled 2021-01-27: qty 1

## 2021-01-27 NOTE — Progress Notes (Signed)
Physical Therapy Treatment Patient Details Name: Matthew Wise MRN: [id number] DOB: Apr 16, 1961 Today's Date: 01/27/2021    History of Present Illness Pt is a 60 y/o male who presents with new-onset LLE numbness and double vision when looking L. MRI revealed acute infarcts in the R posterior temporal and parietal lobes consistent with inferior division MCA infarct. 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    PT Comments    Patient progressing well towards PT goals. Tolerated transfers and gait training with Min guard assist for safety and use of RW. Pt with cognitive deficits relating to awareness, safety and problem solving. Pt walking in multiple rooms that were not his during session despite being told his room number. Performed 5xSTS in 18.94 seconds (norm for his age is <11.4 seconds) indicating decreased functional strength, fall risk and impaired balance. Plans to d/c home with support of sister. Will follow.   Follow Up Recommendations  Home health PT;Supervision for mobility/OOB     Equipment Recommendations  Rolling walker with 5" wheels    Recommendations for Other Services       Precautions / Restrictions Precautions Precautions: Fall Restrictions Weight Bearing Restrictions: No    Mobility  Bed Mobility Overal bed mobility: Needs Assistance Bed Mobility: Sit to Supine       Sit to supine: Supervision   General bed mobility comments: Sitting in chair upon PT arrival. Returned to supine post session.    Transfers Overall transfer level: Needs assistance Equipment used: Rolling walker (2 wheeled) Transfers: Sit to/from Stand Sit to Stand: Min guard         General transfer comment: Min guard for safety. Stood from chair x1, transferred to EOB at end of session.  Ambulation/Gait Ambulation/Gait  assistance: Min guard Gait Distance (Feet): 150 Feet Assistive device: Rolling walker (2 wheeled) Gait Pattern/deviations: Decreased dorsiflexion - left;Trunk flexed;Step-through pattern;Decreased stride length;Decreased step length - left Gait velocity: decreased Gait velocity interpretation: <1.31 ft/sec, indicative of household ambulator General Gait Details: Slow, mostly steady gait with use of RW for support; Cues for upright posture.   Stairs             Wheelchair Mobility    Modified Rankin (Stroke Patients Only) Modified Rankin (Stroke Patients Only) Pre-Morbid Rankin Score: Slight disability Modified Rankin: Moderately severe disability     Balance Overall balance assessment: Needs assistance Sitting-balance support: Feet supported;No upper extremity supported Sitting balance-Leahy Scale: Good Sitting balance - Comments: supervision for safety.   Standing balance support: During functional activity Standing balance-Leahy Scale: Poor Standing balance comment: Reliant on BUE on RW.                            Cognition Arousal/Alertness: Awake/alert Behavior During Therapy: Flat affect Overall Cognitive Status: Impaired/Different from baseline Area of Impairment: Problem solving;Safety/judgement                       Following Commands: Follows multi-step commands with increased time Safety/Judgement: Decreased awareness of deficits   Problem Solving: Difficulty sequencing;Slow processing;Decreased initiation;Requires verbal cues General Comments: Very flat. Walking into multiple rooms that are not his despite being told his room number with no awareness. Slow processing.      Exercises Other Exercises Other Exercises: Sit to stand x5 from EOB    General Comments  General comments (skin integrity, edema, etc.): Performed 5xSTS in 18.94 seconds (norm for his age is <11.4 seconds) indicating decreased functional strength, fall risk and  impaired balance.      Pertinent Vitals/Pain Pain Assessment: No/denies pain    Home Living                      Prior Function            PT Goals (current goals can now be found in the care plan section) Acute Rehab PT Goals Patient Stated Goal: return home with family to assist Progress towards PT goals: Progressing toward goals    Frequency    Min 4X/week      PT Plan Current plan remains appropriate    Co-evaluation              AM-PAC PT "6 Clicks" Mobility   Outcome Measure  Help needed turning from your back to your side while in a flat bed without using bedrails?: None Help needed moving from lying on your back to sitting on the side of a flat bed without using bedrails?: None Help needed moving to and from a bed to a chair (including a wheelchair)?: A Little Help needed standing up from a chair using your arms (e.g., wheelchair or bedside chair)?: A Little Help needed to walk in hospital room?: A Little Help needed climbing 3-5 steps with a railing? : A Little 6 Click Score: 20    End of Session Equipment Utilized During Treatment: Gait belt Activity Tolerance: Patient tolerated treatment well Patient left: in bed;with call bell/phone within reach;with bed alarm set Nurse Communication: Mobility status PT Visit Diagnosis: Muscle weakness (generalized) (M62.81);Other symptoms and signs involving the nervous system (R29.898);Other abnormalities of gait and mobility (R26.89)     Time: 3646-8032 PT Time Calculation (min) (ACUTE ONLY): 13 min  Charges:  $Gait Training: 8-22 mins                     Marisa Severin, PT, DPT Acute Rehabilitation Services Pager [phone] Office [place] 01/27/2021, 1:01 PM

## 2021-01-27 NOTE — Progress Notes (Signed)
ANTICOAGULATION CONSULT NOTE - Initial Consult  Pharmacy Consult for Apixaban Indication: pulmonary embolus, DVT and stroke (history of)  No Known Allergies  Patient Measurements: Height: 6\' 1"  (185.4 cm) Weight: 118.4 kg (261 lb) IBW/kg (Calculated) : 79.9  Vital Signs: Temp: 97.5 F (36.4 C) (02/28 1215) Temp Source: Oral (02/28 1215) BP: 114/103 (02/28 1215) Pulse Rate: 78 (02/28 1215)  Labs: Recent Labs    01/25/21 1916 01/26/21 0242 01/27/21 0202  HGB 14.4 13.6 13.1  HCT 43.0 40.2 38.0*  PLT 200 198 198  APTT 32 31  --   LABPROT 13.6 14.0  --   INR 1.1 1.1  --   CREATININE 1.45* 1.48* 1.51*    Estimated Creatinine Clearance: 71.9 mL/min (A) (by C-G formula based on SCr of 1.51 mg/dL (H)).   Medical History: Past Medical History:  Diagnosis Date  . Acute CVA (cerebrovascular accident) ([place]) 09/09/2020  . Chronic anticoagulation 11/14/2014  . CKD (chronic kidney disease) stage 3, GFR 30-59 ml/min (HCC) 10/24/2019  . CKD (chronic kidney disease) stage 3, GFR 30-59 ml/min (HCC) 10/24/2019  . Clotting disorder ([place])   . Congenital inferior vena cava interruption 06/15/2011  . 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 DVT (deep venous thrombosis) ([place]) 06/15/2011  . DVT of leg (deep venous thrombosis) (HCC)    rt. leg  . Essential hypertension 11/28/2020  . H/O PE (pulmonary embolism) 06/15/2011  . Hyperlipidemia 11/28/2020  . 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 Prediabetes 10/24/2019  . Thyroid nodule 01/25/2021    Medications:  Medications Prior to Admission  Medication Sig Dispense Refill Last Dose  . amLODipine (NORVASC) 5 MG tablet TAKE 1 TABLET (5 MG TOTAL) BY MOUTH DAILY. 90 tablet 0 01/24/2021 at Unknown time  . apixaban (ELIQUIS) 5 MG TABS tablet Take 1 tablet (5 mg total) by mouth 2 (two) times daily. 60 tablet 3 01/25/2021 at 0800  . atorvastatin (LIPITOR) 40 MG tablet TAKE 1 TABLET BY MOUTH EVERY DAY 90  tablet 0 01/24/2021 at Unknown time  . fluticasone (FLONASE) 50 MCG/ACT nasal spray Place 1 spray into both nostrils daily.   01/25/2021 at Unknown time  . levothyroxine (SYNTHROID) 25 MCG tablet TAKE 1 TABLET BY MOUTH EVERY DAY 90 tablet 0 01/24/2021 at Unknown time  . Multiple Vitamins-Minerals (CENTRUM ADULTS PO) Take 1 tablet by mouth daily.   01/25/2021 at Unknown time    Assessment: 60 yo M on apixaban PTA for hx DVT/PE and CVA.  Pt presented to ED with numbness in L leg and found to have acute CVA.  Apixaban initially held on admission.  OK to resume per neurology.  Goal of Therapy:  Therapeutic anticoagulation Monitor platelets by anticoagulation protocol: Yes   Plan:  Apixaban 5mg  PO BID Anticipate no dose adjustments. MD to follow CBC / monitor for bleeding. Rx will sign off.  [employer], Pharm.D., BCPS Clinical Pharmacist Clinical phone for 01/27/2021 from 8:30-4:00 is [phone].  **Pharmacist phone directory can be found on [email or web address] listed under [place].  01/27/2021 12:44 PM

## 2021-01-27 NOTE — Progress Notes (Signed)
Occupational Therapy Treatment Patient Details Name: Matthew Wise MRN: [id number] DOB: 1961-09-11 Today's Date: 01/27/2021    History of present illness Pt is a 60 y/o male who presents with new-onset LLE numbness and double vision when looking L. MRI revealed acute infarcts in the R posterior temporal and parietal lobes consistent with inferior division MCA infarct. 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   OT comments  OT treatment session with focus on self-care re-education, ADL transfers, and vision. Patient continues to be limited by L visual field deficits. During formal vision assessment OT notes deficits greatest in L lower visual field than in L upper visual field. Patient also demonstrates decreased smoothness of pursuits and decreased attention to L visual field missing targets often during formal assessment. Patient with difficulty locating ADL items on L side of sink requiring cues for compensatory strategies including the lighthouse technique. During visual scanning task, patient instructed to cross out every "e" on paper. Patient missed 3 letters but with cueing and increased time, patient able to correctly identify missed letters. Patient continues tor require supervision to Min guard overall for ADLs and ADL transfers with use of RW and would benefit from continued acute OT services to maximize safety and independence in prep for safe d/c home. Continued recommendation for return home with HHOT and 24hr supervision/assist.    Follow Up Recommendations  Supervision/Assistance - 24 hour;Home health OT    Equipment Recommendations  Other (comment) (TBD)    Recommendations for Other Services      Precautions / Restrictions Precautions Precautions: Fall Restrictions Weight Bearing Restrictions: No       Mobility  Bed Mobility Overal bed mobility: Modified Independent             General bed mobility comments: No external assist required.    Transfers Overall transfer level: Needs assistance Equipment used: Rolling walker (2 wheeled) Transfers: Sit to/from Stand Sit to Stand: Min guard         General transfer comment: Min guard for safety and hand placement with use of RW.    Balance Overall balance assessment: Needs assistance Sitting-balance support: Feet supported;No upper extremity supported Sitting balance-Leahy Scale: Fair Sitting balance - Comments: Maintains static/dynamic sitting balance without external assist.   Standing balance support: Bilateral upper extremity supported;During functional activity Standing balance-Leahy Scale: Poor Standing balance comment: Reliant on BUE on RW. Able to maintain standing balnace at sink level with hips braced on sink during grooming tasks.                           ADL either performed or assessed with clinical judgement   ADL Overall ADL's : Needs assistance/impaired     Grooming: Supervision/safety;Cueing for sequencing;Standing;Min guard Grooming Details (indicate cue type and reason): Cues for managing faucet on sink x2 and for replacing cap on toothpaste after oral hygiene.         Upper Body Dressing : Set up;Sitting Upper Body Dressing Details (indicate cue type and reason): Donned posterior hospital gown seated EOB.     Toilet Transfer: Min guard;Ambulation;Comfort height toilet;RW;Cueing for sequencing;Cueing for safety Toilet Transfer Details (indicate cue type and reason): Patient transfers to commode with Min guard and heavy cues for walker management. Toileting- Water quality scientist and Hygiene: Min guard;Sit to/from stand;Set up;Sitting/lateral lean Toileting - Clothing Manipulation Details (  indicate cue type and reason): 3/3 parts of toileting task in standing with Min guard and cues for safety. Use of  RW over standard commode 2/2 balance deficits.     Functional mobility during ADLs: Min guard;Rolling walker General ADL Comments: Patient limited by L visual field deficits lower > upper, decreased tracking/pursuits, L inattention, and decreased balance.     Vision       Perception     Praxis      Cognition Arousal/Alertness: Awake/alert Behavior During Therapy: Flat affect Overall Cognitive Status: Impaired/Different from baseline Area of Impairment: Problem solving;Safety/judgement                       Following Commands: Follows multi-step commands with increased time Safety/Judgement: Decreased awareness of deficits   Problem Solving: Difficulty sequencing;Slow processing;Decreased initiation;Requires verbal cues General Comments: Very flat. Walking into multiple rooms that are not his despite being told his room number with no awareness. Slow processing.        Exercises     Shoulder Instructions       General Comments Cues for lateral steps with use of RW while entering/exiting bathroom.    Pertinent Vitals/ Pain       Pain Assessment: No/denies pain  Home Living                                          Prior Functioning/Environment              Frequency  Min 2X/week        Progress Toward Goals  OT Goals(current goals can now be found in the care plan section)  Progress towards OT goals: Progressing toward goals  Acute Rehab OT Goals Patient Stated Goal: return home with family to assist OT Goal Formulation: With patient Time For Goal Achievement: 02/09/21 Potential to Achieve Goals: Good ADL Goals Pt Will Perform Eating: with modified independence;sitting Pt Will Perform Grooming: Independently;standing Pt Will Perform Upper Body Dressing: with set-up;sitting Pt Will Perform Lower Body Dressing: sit to/from stand;with modified independence Pt Will Transfer to Toilet: with modified independence;ambulating Pt  Will Perform Toileting - Clothing Manipulation and hygiene: with modified independence;sit to/from stand Pt/caregiver will Perform Home Exercise Program: Increased strength;Left upper extremity;Independently;With written HEP provided Additional ADL Goal #1: Pt will accurately graps 4/5 items placed on L side on first attempt to faciliate safety with ADLs.  Plan Discharge plan remains appropriate;Frequency remains appropriate    Co-evaluation                 AM-PAC OT "6 Clicks" Daily Activity     Outcome Measure   Help from another person eating meals?: None Help from another person taking care of personal grooming?: A Little Help from another person toileting, which includes using toliet, bedpan, or urinal?: A Little Help from another person bathing (including washing, rinsing, drying)?: A Little Help from another person to put on and taking off regular upper body clothing?: None Help from another person to put on and taking off regular lower body clothing?: A Little 6 Click Score: 20    End of Session Equipment Utilized During Treatment: Rolling walker  OT Visit Diagnosis: Unsteadiness on feet (R26.81);Other abnormalities of gait and mobility (R26.89);Hemiplegia and hemiparesis;Other symptoms and signs involving cognitive function;Other symptoms and signs involving the nervous system (R29.898) Hemiplegia - Right/Left: Left Hemiplegia -  dominant/non-dominant: Non-Dominant Hemiplegia - caused by: Cerebral infarction   Activity Tolerance Patient tolerated treatment well;Patient limited by fatigue   Patient Left in chair;with call bell/phone within reach;with chair alarm set   Nurse Communication          Time: 2703-5009 OT Time Calculation (min): 32 min  Charges: OT General Charges $OT Visit: 1 Visit OT Treatments $Self Care/Home Management : 8-22 mins $Therapeutic Activity: 8-22 mins  Destanae H. OTR/L Supplemental OT, Department of rehab services  [phone]   Destanae R H. 01/27/2021, 12:56 PM

## 2021-01-27 NOTE — TOC Transition Note (Signed)
Transition of Care [facility]) - CM/SW Discharge Note   Patient Details  Name: HYDEN SOLEY MRN: [id number] Date of Birth: Dec 27, 1960  [facility]) CM/SW Contact:  Pollie Friar, RN Phone Number: 01/27/2021, 2:50 PM   Clinical Narrative:    Pt discharging to sisters home with home health services through [facility]. Tommi Rumps with [facility] accepted the referral.  Sisters address: [address] in [place]. Walker for home delivered to the room per [employer]. Pt has supervision at home and transportation to home.  Final next level of care: Home w Home Health Services Barriers to Discharge: No Barriers Identified   Patient Goals and CMS Choice   CMS Medicare.gov Compare Post Acute Care list provided to:: Patient Choice offered to / list presented to : Patient  Discharge Placement                       Discharge Plan and Services                DME Arranged: Walker rolling DME Agency: [employer] Date DME Agency Contacted: 01/27/21   Representative spoke with at DME Agency: Freda Munro [facility] Arranged: [place]: [place] Date [place]: 01/27/21   Representative spoke with at [place]: [place] ([place]) Interventions     Readmission Risk Interventions No flowsheet data found.

## 2021-01-27 NOTE — Progress Notes (Addendum)
STROKE TEAM PROGRESS NOTE   INTERVAL HISTORY No overnight events. Pt is evaluated at the bed side. No Family present in the room.  Patient states that Xarelto was changed to Eliquis in December.  Although he was taking it regularly twice a day but he was not not consistent with the timings.  He was taking Eliquis in the morning between 10-11 AM and then between 6-7 PM. Pt smokes half pack a day, denies alcohol use and drug abuse. Pt states his numbness has improved a lot. Pt denies any complaints. No focal neurological deficits on exam.  ECHO showed LVEF of 55-60% No PFO, No Shunt. PT recommends Home health PT. Pt was started on Eliquis 5 mg BID. He was instructed to take Eliquis 12 hours apart. No neurological interventions needed at this point.  Neurology will sign out.   Vitals:   01/26/21 2349 01/27/21 0342 01/27/21 0730 01/27/21 1215  BP: (!) 159/87 129/69 119/81 (!) 114/103  Pulse: (!) 50 (!) 53 60 78  Resp: 18 18 16 16   Temp: 98.2 F (36.8 C) 98.2 F (36.8 C) 98.2 F (36.8 C) (!) 97.5 F (36.4 C)  TempSrc: Oral Oral Oral Oral  SpO2: 100% 100% 100% (!) 61%  Weight:      Height:       CBC:  Recent Labs  Lab 01/25/21 1916 01/26/21 0242 01/27/21 0202  WBC 5.0 5.0 5.4  NEUTROABS 3.1  --  2.7  HGB 14.4 13.6 13.1  HCT 43.0 40.2 38.0*  MCV 85.7 83.8 82.8  PLT 200 198 681   Basic Metabolic Panel:  Recent Labs  Lab 01/26/21 0242 01/27/21 0202  NA 139 139  K 3.6 3.3*  CL 101 102  CO2 27 28  GLUCOSE 107* 122*  BUN 8 12  CREATININE 1.48* 1.51*  CALCIUM 9.5 9.6  MG 1.9 2.2  PHOS 3.0 2.9   Lipid Panel:  Recent Labs  Lab 01/26/21 0548  CHOL 116  TRIG 85  HDL 45  CHOLHDL 2.6  VLDL 17  LDLCALC 54   HgbA1c:  Recent Labs  Lab 01/26/21 0548  HGBA1C 5.9*   Urine Drug Screen:  Recent Labs  Lab 01/25/21 1952  LABOPIA NONE DETECTED  COCAINSCRNUR NONE DETECTED  LABBENZ NONE DETECTED  AMPHETMU NONE DETECTED  THCU POSITIVE*  LABBARB NONE DETECTED    Alcohol  Level  Recent Labs  Lab 01/25/21 1916  ETH <10    IMAGING past 24 hours No results found.  PHYSICAL EXAM GENERAL: Awake, alert in NAD HEENT: - Normocephalic and atraumatic LUNGS - Symmetrical chest rise, No labored breathing noted CV - no JVD, No Peripheral Edema ABDOMEN - Soft,  nondistended  Ext: warm, well perfused, intact peripheral pulses, no Peripheral edema  NEURO Exam:   Mental Status: AA&Ox4, Oriented to self, age, place, situation. Good Attention and Concentration Language: speech is Fluent. fluency, and comprehension intact. Cranial Nerves:   CN II Pupils equal and reactive to light, no VF deficits    CN III,IV,VI EOM intact, no gaze preference or deviation, no nystagmus    CN V normal sensation in V1, V2, and V3 segments bilaterally    CN VII no asymmetry, no nasolabial fold flattening    CN VIII normal hearing to speech    CN IX & X normal palatal elevation, no uvular deviation    CN XI 5/5 head turn and 5/5 shoulder shrug bilaterally    CN XII midline tongue protrusion    Motor: No Drift  in Upper Extremities, No Drift in LE's. Strength 5/5 in Rt UE, 5/5 in Lt UE, Strength in Rt LE  5/5, Lt LE 5/5 [place]- Down going  Tone: is normal and bulk is normal Sensation- Intact to light touch bilaterally Coordination: FTN intact bilaterally, no ataxia in BLE. Gait- deferred  ASSESSMENT/PLAN Matthew Wise is a 60 y.o. 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  Stroke:  Multifocal cardioembolic strokes Code Stroke CT head acute/subacute infarction in the right deep insula and right parietal lobe measuring up to 7 cm in diameter. This has clearly progressed to completed infarction in that region. Mild swelling. No midline shift. No hemorrhage. Old infarction in the right insula and  frontal operculum with atrophy and encephalomalacia. 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   CTA head & neck Negative for emergent large vessel occlusion.Mild for age atheromatous change about the carotid bifurcations without hemodynamically significant stenosis.  MRI  Acute infarction in the right posterior temporal and parietal lobe consistent with inferior division MCA infarction. As well as acute infarctions in the left cerebellum and right pontine tegmentum at the level of the superior cerebellar peduncle.  2D Echo LVEF of 55-60% No PFO, No Shunt  LDL 54  HgbA1c 5.9  VTE prophylaxis - heparin    Diet   Diet heart healthy/carb modified Room service appropriate? Yes; Fluid consistency: Thin    Eliquis (apixaban) daily prior to admission, now on Eliquis (apixaban) daily.  Therapy recommendations:  Home Health PT  Disposition:  No neurological interventions needed at this point.  Neurology will sign out.   Hypertension  Home meds:  amLODipine (NORVASC) 5 MG  UnsStable . Permissive hypertension (OK if < 220/120) but gradually normalize in 5-7 days . 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  On Eliquis 5 mg BID   Pt was taking at 10 -11 am and then 6 -7 pm  Pt instructed to take Eliquis 12 hours apart.   PreDiabetes type II  HgbA1c 5.9, goal < 7.0  CBGs Recent Labs    01/27/21 0344 01/27/21 0733 01/27/21 1211  GLUCAP 124* 96 83       Other Stroke Risk Factors  Cigarette smoker- Smokes half pack a day. Advised to stop smoking. Can offer Nicotine.   ETOH use, alcohol level <10, advised to drink no more than 1-2 drink(s) a day  Substance abuse - UDS:  THC POSITIVE, Cocaine NONE DETECTED. Patient advised  to stop using due to stroke risk.  Obesity, Body mass index is 34.43 kg/m., BMI >/= 30 associated with increased stroke risk, recommend weight loss, diet and exercise as appropriate   Hx stroke/TIA  Other Active Problems  Hypothyroidism- Multinodular Goiter- On Synthroid 25 mcg - Continue same.  Hospital day # 2 I have personally obtained history,examined this patient, reviewed notes, independently viewed imaging studies, participated in medical decision making and plan of care.ROS completed by me personally and pertinent positives fully documented  I have made any additions or clarifications directly to the above note. Agree with note above.  Patient with history of DVT and pulmonary embolism on long-term Eliquis  and MRI shows by cerebral embolic infarcts.  Patient counseled to be compliant with taking Eliquis 12 hours apart as well as quit smoking cigarettes and marijuana.  Long discussion patient and with Dr. Florene Glen and answered questions.  Greater than 50% time during this 25-minute visit was spent in counseling and coordination of care and discussion with care team.  Stroke team will sign off.  Kindly call for questions.  Antony Contras, MD Medical Director [place] Pager: [phone] 01/27/2021 2:53 PM      To contact Stroke Continuity provider, please refer to [email or web address] After hours, contact General Neurology
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
## 2021-01-27 NOTE — TOC CAGE-AID Note (Signed)
Transition of Care [facility]) - CAGE-AID Screening   Patient Details  Name: Matthew Wise MRN: [id number] Date of Birth: 04-14-1961  [facility] CM/SW Contact:    Pollie Friar, RN Phone Number: 01/27/2021, 1:30 PM   Clinical Narrative: Pt denies use of THC. He says he is unsure of how THC got into his system.  Pt refused counseling resources.   CAGE-AID Screening: Substance Abuse Screening unable to be completed due to: : Patient Refused             Substance Abuse Education Offered: Yes (pt refused)

## 2021-01-28 ENCOUNTER — Telehealth: Payer: Self-pay | Admitting: *Deleted

## 2021-01-28 ENCOUNTER — Encounter: Payer: Self-pay | Admitting: Neurology

## 2021-01-28 NOTE — Telephone Encounter (Signed)
Transition Care Management Follow-up Telephone Call  Date of discharge and from where: 01/27/2021 - [facility]  How have you been since you were released from the hospital? "Doing well"  Any questions or concerns? No  Items Reviewed:  Did the pt receive and understand the discharge instructions provided? Yes   Medications obtained and verified? Yes   Other? No   Any new allergies since your discharge? No   Dietary orders reviewed? Yes  Do you have support at home? Yes   Home Care and Equipment/Supplies: Were home health services ordered? not applicable If so, what is the name of the agency? N/A  Has the agency set up a time to come to the patient's home? not applicable Were any new equipment or medical supplies ordered?  No What is the name of the medical supply agency? N/A Were you able to get the supplies/equipment? not applicable Do you have any questions related to the use of the equipment or supplies? No  Functional Questionnaire: (I = Independent and D = Dependent) ADLs: I  Bathing/Dressing- I  Meal Prep- I  Eating- I  Maintaining continence- I  Transferring/Ambulation- I  Managing Meds- I  Follow up appointments reviewed:   PCP Hospital f/u appt confirmed? Yes  Scheduled to see PCP on 02/05/2021 @ 1600.  [facility] f/u appt confirmed? Yes  Scheduled to see Neurology on 02/25/2021 @ 1250 and GI on 02/26/2021 @ 0930.  Are transportation arrangements needed? No   If their condition worsens, is the pt aware to call PCP or go to the Emergency Dept.? Yes  Was the patient provided with contact information for the PCP's office or ED? Yes  Was to pt encouraged to call back with questions or concerns? Yes

## 2021-02-02 ENCOUNTER — Encounter (HOSPITAL_COMMUNITY): Payer: Self-pay

## 2021-02-02 ENCOUNTER — Emergency Department (HOSPITAL_COMMUNITY): Payer: Medicare Other | Admitting: Anesthesiology

## 2021-02-02 ENCOUNTER — Inpatient Hospital Stay (HOSPITAL_COMMUNITY)
Admission: EM | Admit: 2021-02-02 | Discharge: 2021-02-12 | DRG: 023 | Disposition: A | Discharge status: Rehabilitation Facility | Payer: Medicare Other | Attending: Neurology | Admitting: Neurology

## 2021-02-02 ENCOUNTER — Emergency Department (HOSPITAL_COMMUNITY): Payer: Medicare Other

## 2021-02-02 ENCOUNTER — Encounter (HOSPITAL_COMMUNITY)
Admission: EM | Disposition: A | Discharge status: Rehabilitation Facility | Payer: Self-pay | Source: Home / Self Care | Attending: Neurology

## 2021-02-02 DIAGNOSIS — E669 Obesity, unspecified: Secondary | ICD-10-CM | POA: Diagnosis present

## 2021-02-02 DIAGNOSIS — I6930 Unspecified sequelae of cerebral infarction: Secondary | ICD-10-CM

## 2021-02-02 DIAGNOSIS — I82532 Chronic embolism and thrombosis of left popliteal vein: Secondary | ICD-10-CM | POA: Diagnosis not present

## 2021-02-02 DIAGNOSIS — Z8673 Personal history of transient ischemic attack (TIA), and cerebral infarction without residual deficits: Secondary | ICD-10-CM | POA: Diagnosis not present

## 2021-02-02 DIAGNOSIS — R233 Spontaneous ecchymoses: Secondary | ICD-10-CM | POA: Diagnosis not present

## 2021-02-02 DIAGNOSIS — I69354 Hemiplegia and hemiparesis following cerebral infarction affecting left non-dominant side: Secondary | ICD-10-CM

## 2021-02-02 DIAGNOSIS — Z86718 Personal history of other venous thrombosis and embolism: Secondary | ICD-10-CM | POA: Diagnosis not present

## 2021-02-02 DIAGNOSIS — G936 Cerebral edema: Secondary | ICD-10-CM | POA: Diagnosis present

## 2021-02-02 DIAGNOSIS — R29716 NIHSS score 16: Secondary | ICD-10-CM | POA: Diagnosis not present

## 2021-02-02 DIAGNOSIS — D5703 Hb-ss disease with cerebral vascular involvement: Secondary | ICD-10-CM

## 2021-02-02 DIAGNOSIS — R350 Frequency of micturition: Secondary | ICD-10-CM | POA: Diagnosis not present

## 2021-02-02 DIAGNOSIS — I63411 Cerebral infarction due to embolism of right middle cerebral artery: Secondary | ICD-10-CM | POA: Diagnosis not present

## 2021-02-02 DIAGNOSIS — R6889 Other general symptoms and signs: Secondary | ICD-10-CM | POA: Diagnosis not present

## 2021-02-02 DIAGNOSIS — R29810 Facial weakness: Secondary | ICD-10-CM | POA: Diagnosis present

## 2021-02-02 DIAGNOSIS — R29818 Other symptoms and signs involving the nervous system: Secondary | ICD-10-CM | POA: Diagnosis not present

## 2021-02-02 DIAGNOSIS — I10 Essential (primary) hypertension: Secondary | ICD-10-CM | POA: Diagnosis not present

## 2021-02-02 DIAGNOSIS — I69391 Dysphagia following cerebral infarction: Secondary | ICD-10-CM | POA: Diagnosis not present

## 2021-02-02 DIAGNOSIS — G40909 Epilepsy, unspecified, not intractable, without status epilepticus: Secondary | ICD-10-CM | POA: Diagnosis present

## 2021-02-02 DIAGNOSIS — Z79890 Hormone replacement therapy (postmenopausal): Secondary | ICD-10-CM

## 2021-02-02 DIAGNOSIS — E782 Mixed hyperlipidemia: Secondary | ICD-10-CM | POA: Diagnosis not present

## 2021-02-02 DIAGNOSIS — G4731 Primary central sleep apnea: Secondary | ICD-10-CM | POA: Diagnosis not present

## 2021-02-02 DIAGNOSIS — R1310 Dysphagia, unspecified: Secondary | ICD-10-CM | POA: Diagnosis not present

## 2021-02-02 DIAGNOSIS — E039 Hypothyroidism, unspecified: Secondary | ICD-10-CM | POA: Diagnosis present

## 2021-02-02 DIAGNOSIS — G8191 Hemiplegia, unspecified affecting right dominant side: Secondary | ICD-10-CM | POA: Diagnosis present

## 2021-02-02 DIAGNOSIS — I609 Nontraumatic subarachnoid hemorrhage, unspecified: Secondary | ICD-10-CM | POA: Diagnosis present

## 2021-02-02 DIAGNOSIS — R569 Unspecified convulsions: Secondary | ICD-10-CM | POA: Diagnosis not present

## 2021-02-02 DIAGNOSIS — I129 Hypertensive chronic kidney disease with stage 1 through stage 4 chronic kidney disease, or unspecified chronic kidney disease: Secondary | ICD-10-CM | POA: Diagnosis present

## 2021-02-02 DIAGNOSIS — Z20822 Contact with and (suspected) exposure to covid-19: Secondary | ICD-10-CM | POA: Diagnosis present

## 2021-02-02 DIAGNOSIS — Z7901 Long term (current) use of anticoagulants: Secondary | ICD-10-CM

## 2021-02-02 DIAGNOSIS — Z9889 Other specified postprocedural states: Secondary | ICD-10-CM | POA: Diagnosis not present

## 2021-02-02 DIAGNOSIS — I639 Cerebral infarction, unspecified: Secondary | ICD-10-CM

## 2021-02-02 DIAGNOSIS — D689 Coagulation defect, unspecified: Secondary | ICD-10-CM | POA: Diagnosis present

## 2021-02-02 DIAGNOSIS — N183 Chronic kidney disease, stage 3 unspecified: Secondary | ICD-10-CM | POA: Diagnosis present

## 2021-02-02 DIAGNOSIS — Z86711 Personal history of pulmonary embolism: Secondary | ICD-10-CM | POA: Diagnosis not present

## 2021-02-02 DIAGNOSIS — I6601 Occlusion and stenosis of right middle cerebral artery: Secondary | ICD-10-CM | POA: Diagnosis not present

## 2021-02-02 DIAGNOSIS — I63511 Cerebral infarction due to unspecified occlusion or stenosis of right middle cerebral artery: Secondary | ICD-10-CM | POA: Diagnosis not present

## 2021-02-02 DIAGNOSIS — Z8249 Family history of ischemic heart disease and other diseases of the circulatory system: Secondary | ICD-10-CM | POA: Diagnosis not present

## 2021-02-02 DIAGNOSIS — M7989 Other specified soft tissue disorders: Secondary | ICD-10-CM | POA: Diagnosis not present

## 2021-02-02 DIAGNOSIS — D6859 Other primary thrombophilia: Secondary | ICD-10-CM

## 2021-02-02 DIAGNOSIS — Z6833 Body mass index (BMI) 33.0-33.9, adult: Secondary | ICD-10-CM

## 2021-02-02 DIAGNOSIS — Z743 Need for continuous supervision: Secondary | ICD-10-CM | POA: Diagnosis not present

## 2021-02-02 DIAGNOSIS — F17210 Nicotine dependence, cigarettes, uncomplicated: Secondary | ICD-10-CM | POA: Diagnosis present

## 2021-02-02 DIAGNOSIS — I82513 Chronic embolism and thrombosis of femoral vein, bilateral: Secondary | ICD-10-CM | POA: Diagnosis not present

## 2021-02-02 DIAGNOSIS — Z823 Family history of stroke: Secondary | ICD-10-CM

## 2021-02-02 DIAGNOSIS — E785 Hyperlipidemia, unspecified: Secondary | ICD-10-CM | POA: Diagnosis present

## 2021-02-02 DIAGNOSIS — D571 Sickle-cell disease without crisis: Secondary | ICD-10-CM | POA: Diagnosis present

## 2021-02-02 DIAGNOSIS — R29715 NIHSS score 15: Secondary | ICD-10-CM | POA: Diagnosis not present

## 2021-02-02 DIAGNOSIS — G8110 Spastic hemiplegia affecting unspecified side: Secondary | ICD-10-CM | POA: Diagnosis not present

## 2021-02-02 DIAGNOSIS — M79604 Pain in right leg: Secondary | ICD-10-CM | POA: Diagnosis not present

## 2021-02-02 DIAGNOSIS — M79605 Pain in left leg: Secondary | ICD-10-CM | POA: Diagnosis not present

## 2021-02-02 DIAGNOSIS — N39498 Other specified urinary incontinence: Secondary | ICD-10-CM | POA: Diagnosis not present

## 2021-02-02 DIAGNOSIS — I2782 Chronic pulmonary embolism: Secondary | ICD-10-CM | POA: Diagnosis not present

## 2021-02-02 DIAGNOSIS — I952 Hypotension due to drugs: Secondary | ICD-10-CM | POA: Diagnosis not present

## 2021-02-02 DIAGNOSIS — N1832 Chronic kidney disease, stage 3b: Secondary | ICD-10-CM | POA: Diagnosis not present

## 2021-02-02 DIAGNOSIS — Z79899 Other long term (current) drug therapy: Secondary | ICD-10-CM

## 2021-02-02 HISTORY — PX: RADIOLOGY WITH ANESTHESIA: SHX6223

## 2021-02-02 HISTORY — PX: IR CT HEAD LTD: IMG2386

## 2021-02-02 HISTORY — PX: IR PERCUTANEOUS ART THROMBECTOMY/INFUSION INTRACRANIAL INC DIAG ANGIO: IMG6087

## 2021-02-02 LAB — DIFFERENTIAL
Abs Immature Granulocytes: 0.01 10*3/uL (ref 0.00–0.07)
Basophils Absolute: 0 10*3/uL (ref 0.0–0.1)
Basophils Relative: 0 %
Eosinophils Absolute: 0 10*3/uL (ref 0.0–0.5)
Eosinophils Relative: 1 %
Immature Granulocytes: 0 %
Lymphocytes Relative: 19 %
Lymphs Abs: 1.2 10*3/uL (ref 0.7–4.0)
Monocytes Absolute: 0.4 10*3/uL (ref 0.1–1.0)
Monocytes Relative: 6 %
Neutro Abs: 4.6 10*3/uL (ref 1.7–7.7)
Neutrophils Relative %: 74 %

## 2021-02-02 LAB — CBC
HCT: 39.8 % (ref 39.0–52.0)
Hemoglobin: 13.3 g/dL (ref 13.0–17.0)
MCH: 28.9 pg (ref 26.0–34.0)
MCHC: 33.4 g/dL (ref 30.0–36.0)
MCV: 86.3 fL (ref 80.0–100.0)
Platelets: 188 10*3/uL (ref 150–400)
RBC: 4.61 MIL/uL (ref 4.22–5.81)
RDW: 13.5 % (ref 11.5–15.5)
WBC: 6.2 10*3/uL (ref 4.0–10.5)
nRBC: 0 % (ref 0.0–0.2)

## 2021-02-02 LAB — COMPREHENSIVE METABOLIC PANEL
ALT: 34 U/L (ref 0–44)
AST: 33 U/L (ref 15–41)
Albumin: 4.4 g/dL (ref 3.5–5.0)
Alkaline Phosphatase: 100 U/L (ref 38–126)
Anion gap: 9 (ref 5–15)
BUN: 16 mg/dL (ref 6–20)
CO2: 26 mmol/L (ref 22–32)
Calcium: 9.3 mg/dL (ref 8.9–10.3)
Chloride: 105 mmol/L (ref 98–111)
Creatinine, Ser: 1.83 mg/dL — ABNORMAL HIGH (ref 0.61–1.24)
GFR, Estimated: 42 mL/min — ABNORMAL LOW (ref >60)
Glucose, Bld: 123 mg/dL — ABNORMAL HIGH (ref 70–99)
Potassium: 3.9 mmol/L (ref 3.5–5.1)
Sodium: 140 mmol/L (ref 135–145)
Total Bilirubin: 0.8 mg/dL (ref 0.3–1.2)
Total Protein: 7.9 g/dL (ref 6.5–8.1)

## 2021-02-02 LAB — PROTIME-INR
INR: 1.1 (ref 0.8–1.2)
Prothrombin Time: 13.8 seconds (ref 11.4–15.2)

## 2021-02-02 LAB — CBG MONITORING, ED: Glucose-Capillary: 91 mg/dL (ref 70–99)

## 2021-02-02 LAB — ETHANOL: Alcohol, Ethyl (B): <10 mg/dL (ref <10)

## 2021-02-02 LAB — RESP PANEL BY RT-PCR (FLU A&B, COVID) ARPGX2
Influenza A by PCR: NEGATIVE
Influenza B by PCR: NEGATIVE
SARS Coronavirus 2 by RT PCR: NEGATIVE

## 2021-02-02 LAB — APTT: aPTT: 31 seconds (ref 24–36)

## 2021-02-02 IMAGING — CT CT HEAD CODE STROKE
3 series · 15 of 47 positions shown, 18 images · non-contrast
Comparison: [DATE]

CLINICAL DATA: Code stroke.  Left-sided weakness

EXAM:
CT HEAD WITHOUT CONTRAST
TECHNIQUE: Contiguous axial images were obtained from the base of the skull
through the vertex without intravenous contrast.

[Series 2: head w o · axial · 0.50mm/px · z∈[-68,+122]mm · 9 of 44 slices shown, 12 images]
[im 3/44  brain]
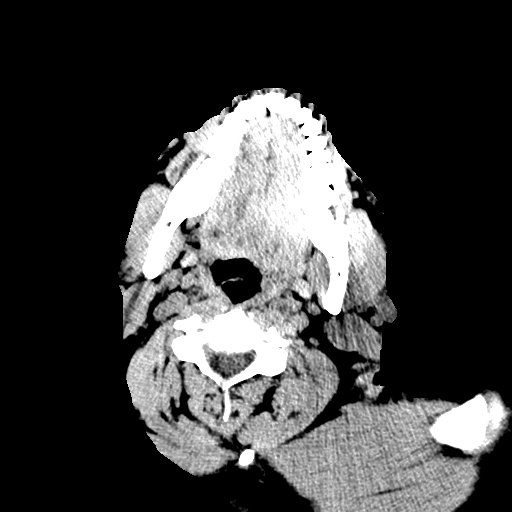
[im 3/44  bone]
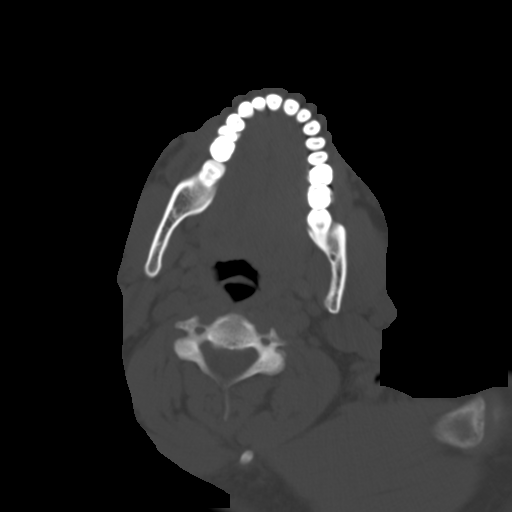
[im 8/44  brain]
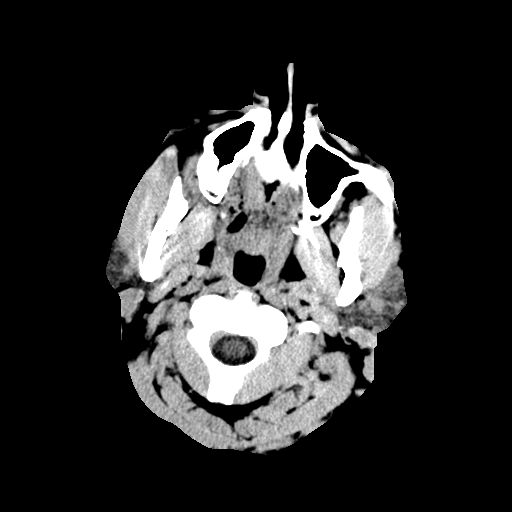
[im 12/44  brain]
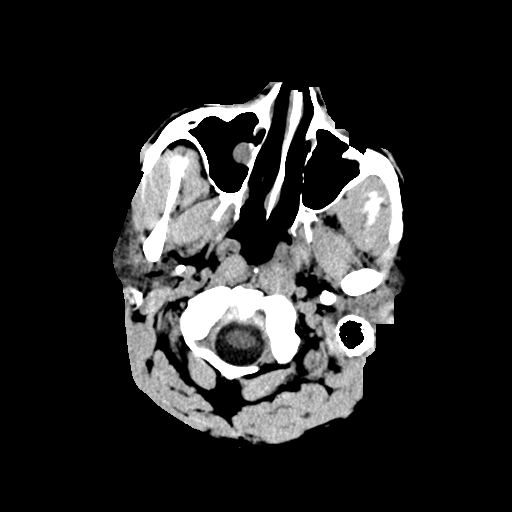
[im 17/44  brain]
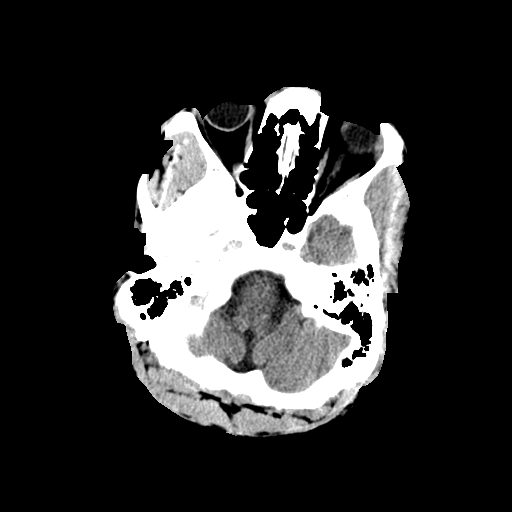
[im 23/44  brain]
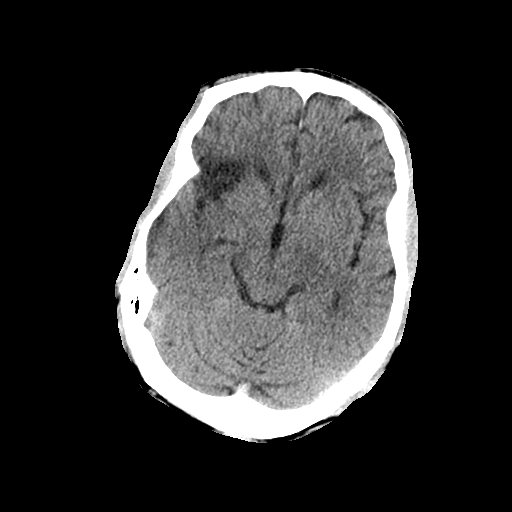
[im 23/44  bone]
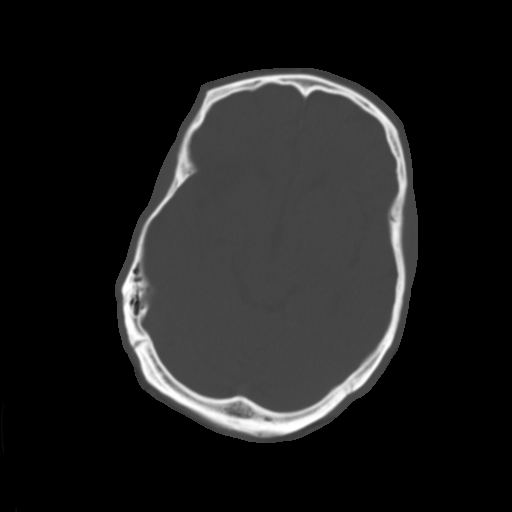
[im 27/44  brain]
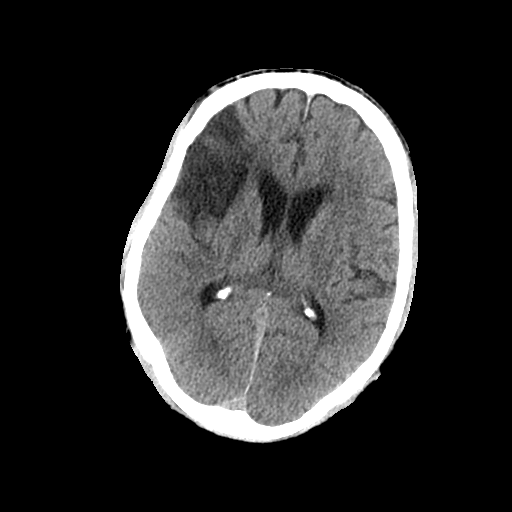
[im 32/44  brain]
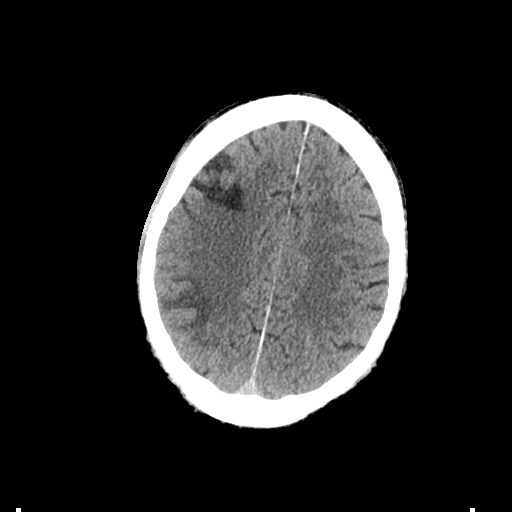
[im 36/44  brain]
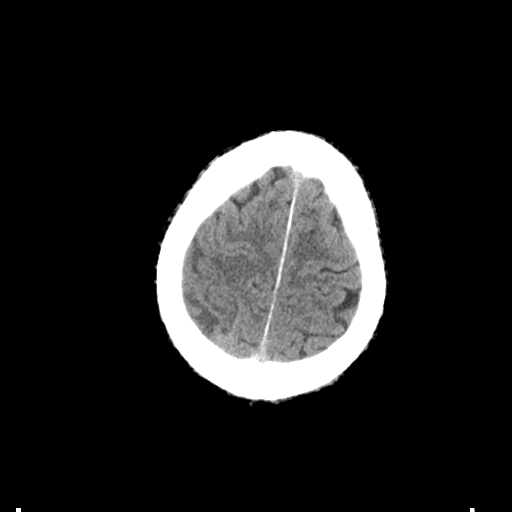
[im 41/44  brain]
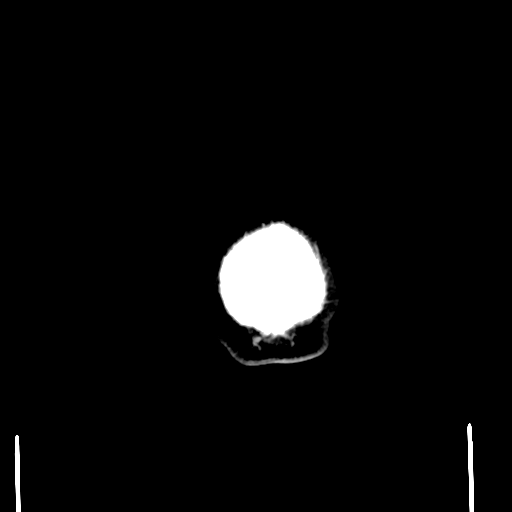
[im 41/44  bone]
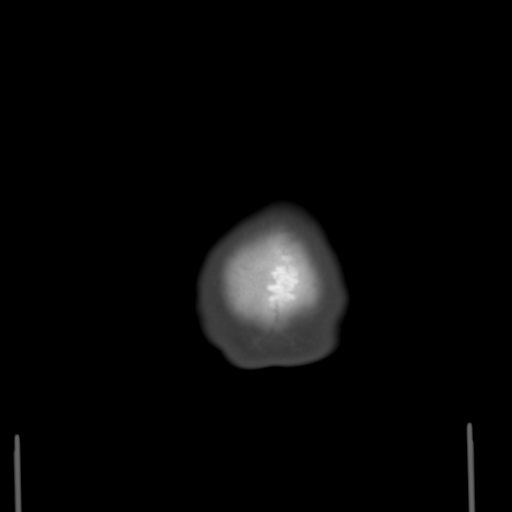

[Series 4: coronal soft · coronal · 0.35mm/px · 3 of 76 slices shown]
[im 26/76  brain]
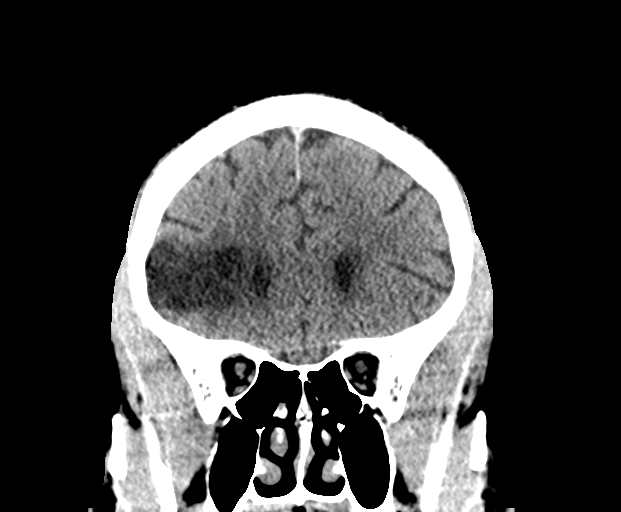
[im 34/76  brain]
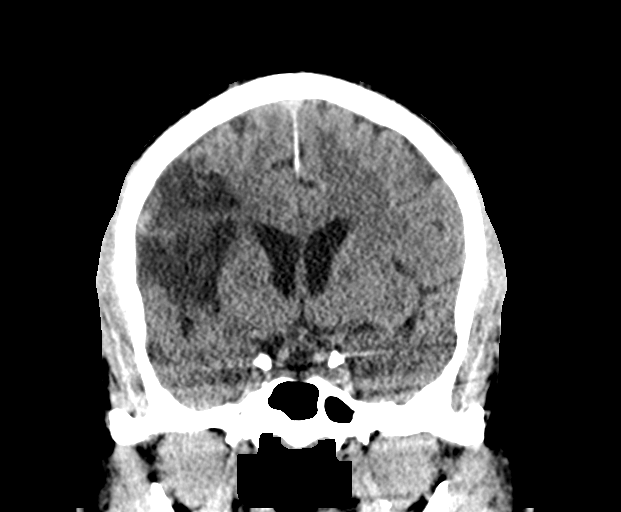
[im 42/76  brain]
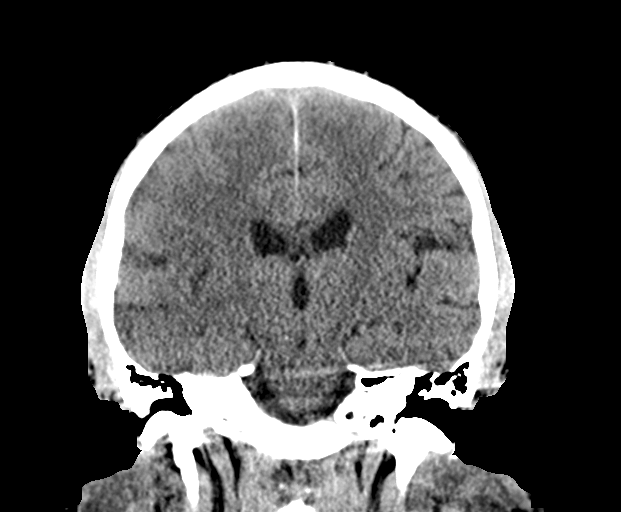

[Series 5: sagittal soft · sagittal · 0.39mm/px · 3 of 65 slices shown]
[im 22/65  brain]
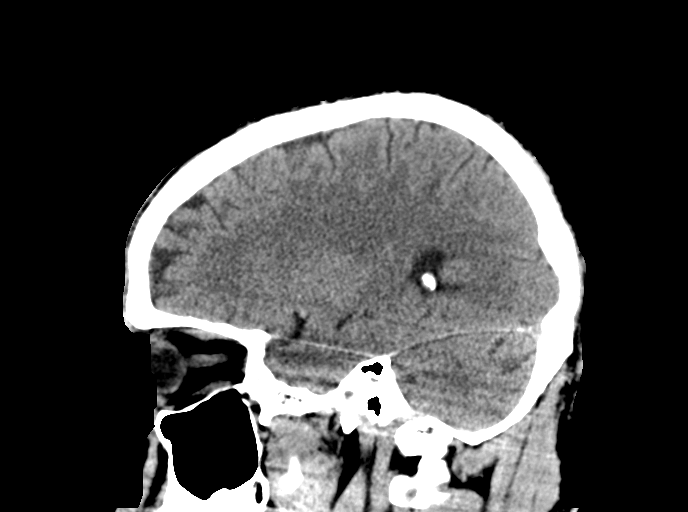
[im 33/65  brain]
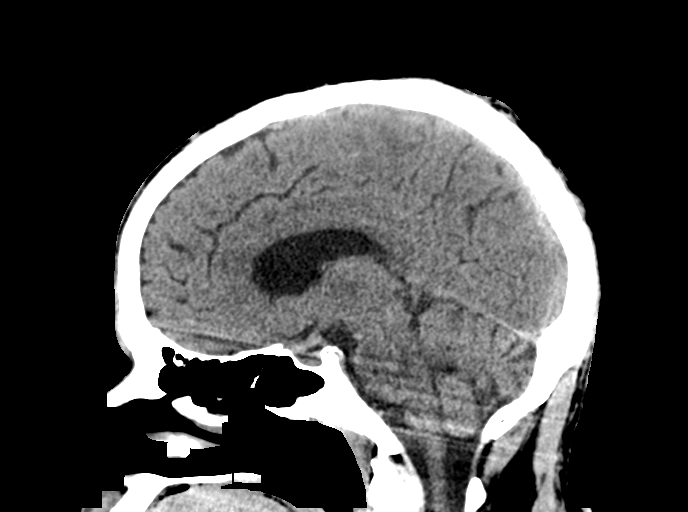
[im 43/65  brain]
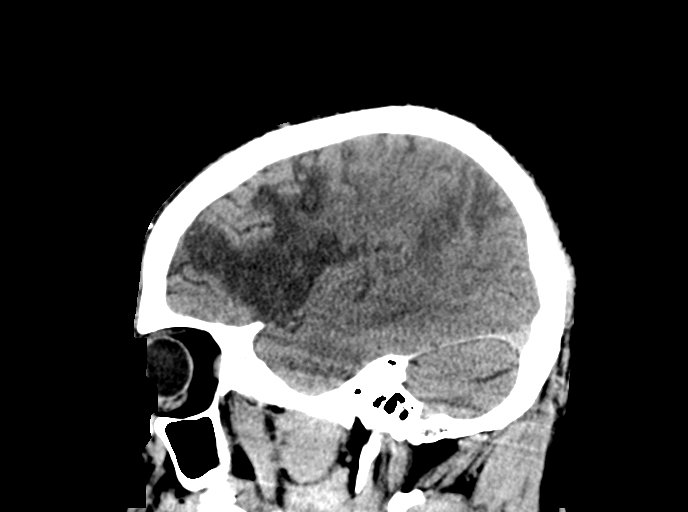

[15 of 47 positions shown; findings below may reference images not displayed]

FINDINGS: Brain: No acute intracranial hemorrhage. Suspect new loss of
gray-white differentiation in the right frontoparietal lobes
posterior to the chronic frontal infarct with involvement of lateral
precentral and postcentral gyri. Evolution of recent prior
infarction involving the right parietal and temporal lobes. Small
chronic left cerebellar infarcts are again seen. Stable findings of
probable chronic microvascular ischemic changes. No hydrocephalus or
extra-axial collection.

Vascular: No hyperdense vessel.

Skull: Unremarkable.

Sinuses/Orbits: No acute abnormality.

Other: None.
IMPRESSION: No acute intracranial hemorrhage. Suspect acute infarction of the
right frontoparietal lobes posterosuperior to chronic infarct with
probable involvement of lateral precentral and postcentral gyri.

These results were called by telephone at the time of interpretation
on [DATE] at [DATE] to provider BOUTROS , who verbally
acknowledged these results.

## 2021-02-02 IMAGING — XA IR PERCUTANEOUS ART THORMBECTOMY/INFUSION INTRACRANIAL INCLUDE D
1 series · 10 of 24 positions shown · IV contrast (IODINE)
Comparison: CT angiogram the head and neck [DATE].

INDICATION: New onset left-sided weakness. Occluded right middle cerebral artery
M1 segment on CT angiogram head and neck.

EXAM:
1. EMERGENT LARGE VESSEL OCCLUSION THROMBOLYSIS (anterior
CIRCULATION)
TECHNIQUE: Following a full explanation of the procedure along with the
potential associated complications, an informed witnessed consent
was obtained from the patient's sister. The risks of intracranial
hemorrhage of 10%, worsening neurological deficit, ventilator
dependency, death and inability to revascularize were all reviewed
in detail with the patient's sister.

[Series 300: dr. (person_name). · 10 of 261 slices shown]
[im 12/261]
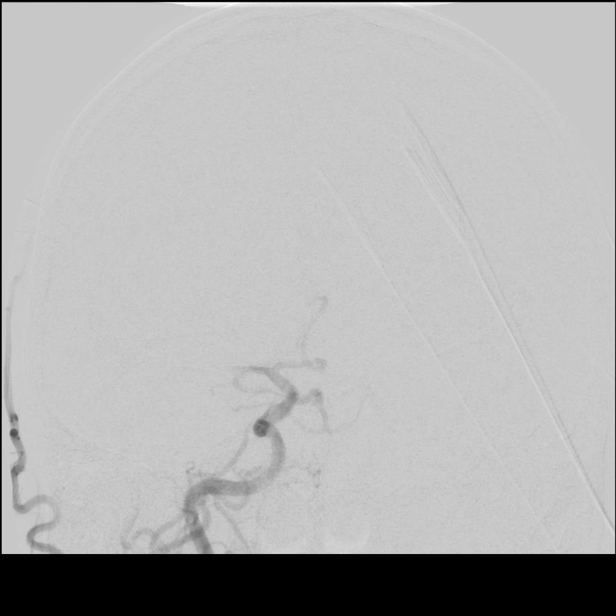
[im 34/261]
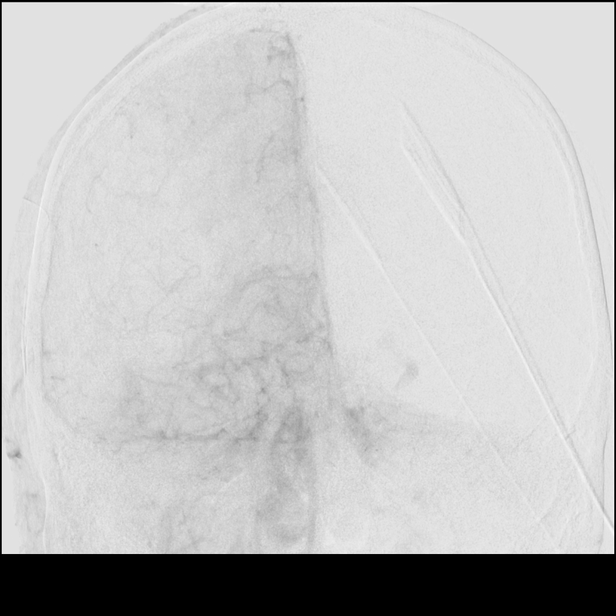
[im 68/261]
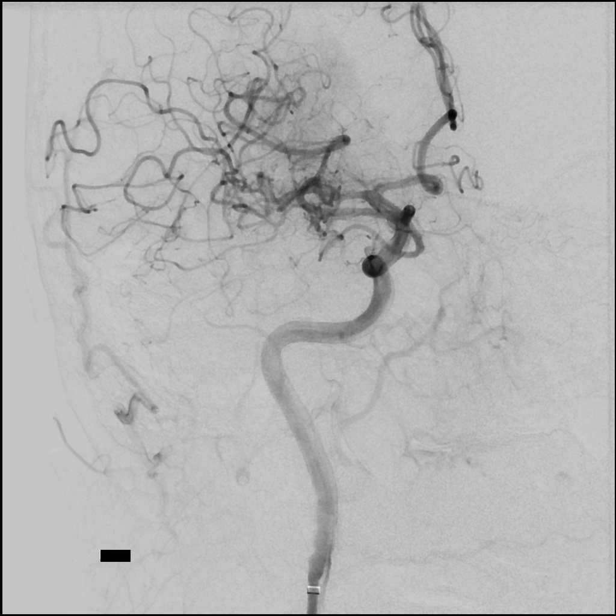
[im 91/261]
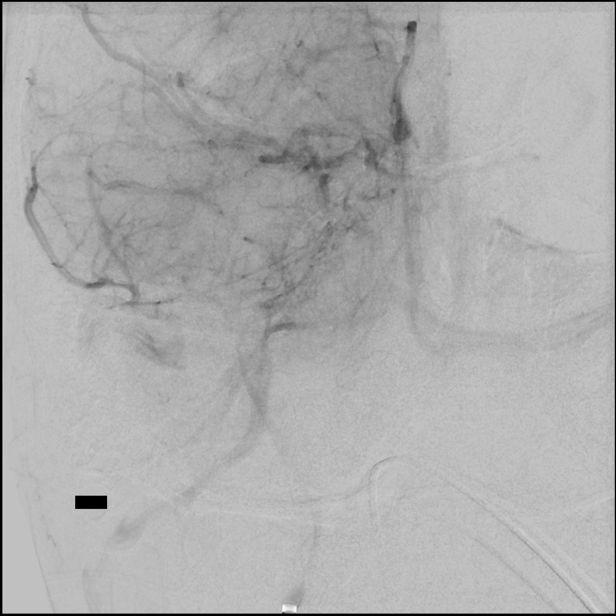
[im 114/261]
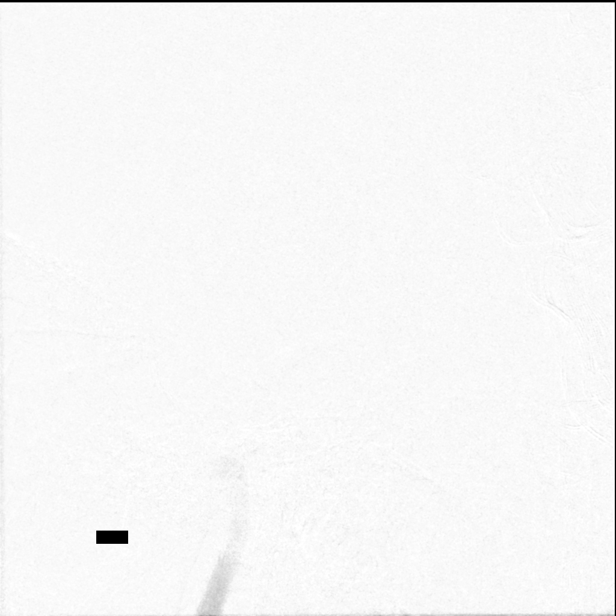
[im 147/261]
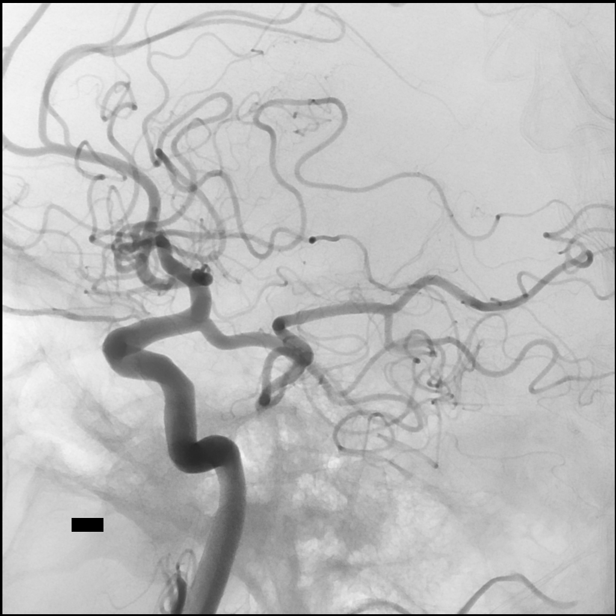
[im 170/261]
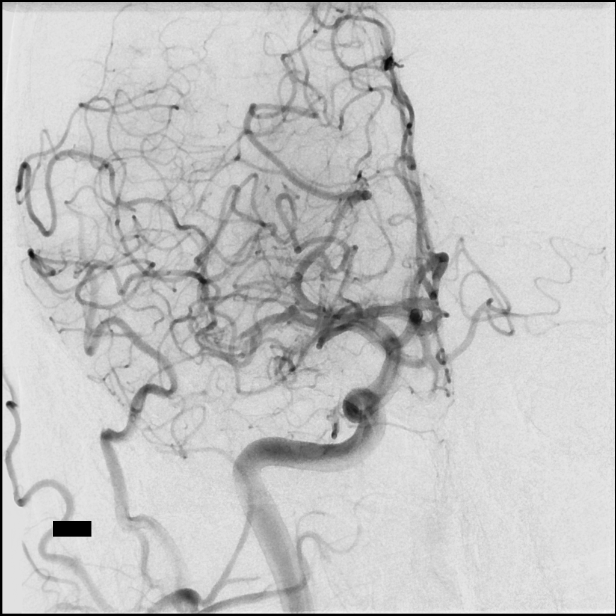
[im 204/261]
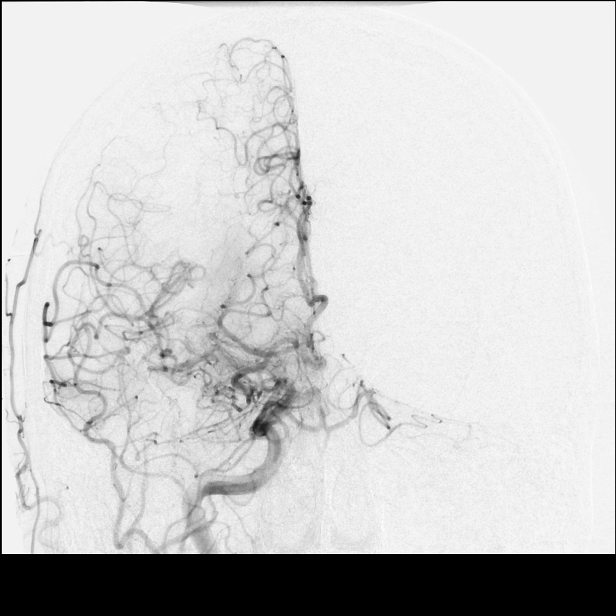
[im 227/261]
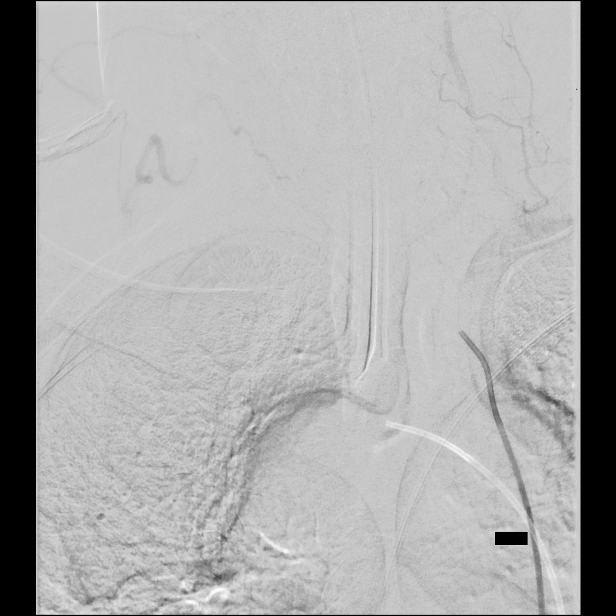
[im 249/261]
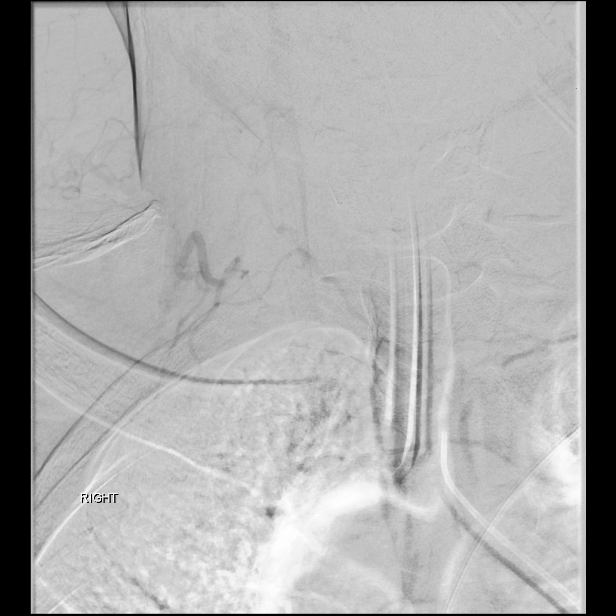

[10 of 24 positions shown; findings below may reference images not displayed]

MEDICATIONS:
Ancef 2 g IV antibiotic was administered within 1 hour of the
procedure.

ANESTHESIA/SEDATION:
General anesthesia

CONTRAST:  Isovue 300 approximately 120 mL

FLUOROSCOPY TIME:  Fluoroscopy Time: 29 minutes 36 seconds ([XB]
mGy).

COMPLICATIONS:
None immediate.
The patient was then put under general anesthesia by the [REDACTED] at [HOSPITAL].

The right groin was prepped and draped in the usual sterile fashion.
Thereafter using modified Seldinger technique, transfemoral access
into the right common femoral artery was obtained without
difficulty. Over a 0.035 inch guidewire an 8 French 25 cm Pinnacle
sheath was inserted. Through this, and also over a 0.035 inch
guidewire a 5 French JB 1 catheter was advanced to the aortic arch
region and selectively positioned in the right common carotid
artery.
FINDINGS: The right common carotid arteriogram demonstrates the right external
carotid artery and its major branches to be widely patent.

The right internal carotid artery at the bulb has a smooth shallow
plaque along the posterior wall without evidence of significant
stenosis by the NASCET criteria, or of ulcerations.

More distally the right internal carotid artery is seen to opacify
to the cranial skull base. Patency is seen of the petrous, cavernous
and the supraclinoid segments. A right posterior communicating
artery is seen opacifying the distal basilar artery, and the right
posterior cerebral artery and the superior cerebellar arteries into
the capillary and venous phases. Delayed antegrade clearance of
distal basilar artery is seen. The right anterior cerebral artery
opacifies into the capillary and venous phases. The right middle
cerebral artery demonstrates complete angiographic occlusion in its
mid M1 segment. The lateral images on the delayed arterial phase
demonstrates partial retrograde opacification of the posterior
frontal and the anterior parietal, and of the posterior parietal
cortical subcortical areas from the right anterior cerebral artery
distal branches, and the right posterior cerebral artery P3
branches.

PROCEDURE:
The diagnostic JB 1 catheter in right common carotid artery was then
exchanged over a 0.035 inch 300 cm Rosen exchange guidewire for an
087 balloon guide catheter which was then advanced without
difficulty into the distal right internal carotid artery cervical
segment. The guidewire was removed. Good aspiration was obtained
from the hub of balloon guide catheter. A gentle control arteriogram
performed through this demonstrated no evidence of vasospasm,
intraluminal filling defects or of dissections. No change was seen
in the intracranial circulation.

Over a 0.014 inch Aristotle micro guidewire the combination of an
021 microcatheter inside of an 071 136 cm Zoom aspiration catheter
was advanced without difficulty to the supraclinoid right ICA.

The micro guidewire was then gently manipulated with a torque device
and advanced without difficulty into the M2 M3 region of the
superior division of the right middle cerebral artery followed by
the microcatheter. The guidewire was removed. Good aspiration was
obtained from the hub of the microcatheter. A gentle control
arteriogram performed through the microcatheter demonstrated safe
position of the tip of the microcatheter. This was then connected to
continuous heparinized saline infusion.

A 3 mm x 40 mm Solitaire X retrieval device was then advanced to the
distal end of the microcatheter. The O ring on the delivery
microcatheter was loosened. With slight forward gentle traction with
the right hand on the delivery micro guidewire, with the left the
delivery microcatheter was retrieved deploying the retrieval device.
The aspiration catheter was then engaged at the origin of the
superior division branch. With proximal flow arrest in the right
internal carotid artery, and constant aspiration applied at the hub
of the Zoom aspiration catheter, and with a 20 mL syringe at the hub
of the balloon guide catheter, over 2 minutes, the combination of
the retrieval device, the microcatheter and the aspiration catheter
were retrieved and removed. Following reversal of flow arrest, a
control arteriogram performed through the balloon guide in the right
internal carotid artery demonstrated significantly improved
opacification of the right MCA distribution. Moderate spasm of the
right M1 segment resolved with 2 aliquots of 25 mcg of
nitroglycerin.

A control arteriogram performed after this demonstrated a TICI 2C
revascularization of the right MCA distribution.

A distal M2 branch of the inferior division demonstrates severe
tapered stenosis with occlusion. This did not respond to additional
dose of 25 mcg of nitroglycerin.

A combination of the 021 microcatheter inside of a 55 136 cm Zoom
aspiration catheter was then advanced over a 0.014 inch micro
guidewire with a J configuration in the manner described above.

The micro guidewire was gently advanced through this occluded branch
into the M3 region followed by the microcatheter. The guidewire was
removed. Slow aspiration was noted at the hub of the microcatheter.
A gentle control arteriogram performed through microcatheter
however, demonstrated patency distal to the tip. Microcatheter was
connected to continuous heparinized saline infusion.

A 3 mm x 40 mm Solitaire X retrieval device was advanced to the
distal end of the microcatheter and deployed in the manner described
as above.

The 55 aspiration catheter was advanced to just proximal to the
occlusion. With proximal flow arrest in the right internal carotid
artery, and constant aspiration applied the hub of the 55 aspiration
Zoom catheter over 2-1/2 minutes, the combination of the retrieval
device, the microcatheter and the aspiration catheter were retrieved
and removed. A few specks of grayish white thrombi were seen
entangled in the retrieval device. A control arteriogram performed
through the balloon guide catheter following reversal of flow arrest
demonstrated marginally improved caliber and flow in the treated
vessel with continued occlusion distally. Another aliquots of 25 mcg
of nitroglycerin was then infused intra-arterially without
resolution of the occlusion in the distal M2 segment of this branch.
Angiographically, and also given the texture of the retrieved specks
of tissue, this most likely represented intracranial
arteriosclerosis.

Subsequent revascularization of this management performed due to the
distal nature of the occlusion due to probably intracranial
arteriosclerosis, probably responsible for the ischemic event in
[REDACTED].

A final control arteriogram performed through the balloon guide
catheter in the right internal carotid artery continued to
demonstrate a TICI 2C revascularization of the right MCA
distribution with significant support also from the left middle
collaterals from the anterior cerebral artery, and the P3 branches
of the right posterior cerebral artery.

More proximal arteriogram performed in the right common carotid
artery demonstrated wide patency of the right internal carotid
artery with no change in the intracranial circulation.

The balloon guide was removed. The 8 French Pinnacle sheath was
removed with the successful application of an 8 French Angio-Seal
closure device for hemostasis at the right groin puncture site.
Distal pulses remained Dopplerable in both feet unchanged.

CT of the brain demonstrated no gross intracranial hemorrhages.

However, noted was early edematous changes involving the cortical
areas of the posterior temporal and the parietal cerebral
convexities probably representing luxury perfusion.

Also noted was a small focal area of contrast stain in the posterior
aspect of the right putamen.

No mass-effect or midline shift was noted.

The patient's general anesthesia was then reversed, and the patient
was extubated successfully. Upon slow recovery, the patient was able
to close his eyes to command and also move his right arm and leg. No
movement was seen in the left arm and left leg. He was then
transferred to the neuro ICU for post revascularization management.
IMPRESSION: Status post endovascular revascularization of occluded right middle
cerebral artery distribution achieving a TICI 2C revascularization
with 2 passes with a 3 mm x 40 mm Solitaire X retrieval device and
contact aspiration.

PLAN:
As per referring MAYE.

## 2021-02-02 SURGERY — IR WITH ANESTHESIA
Anesthesia: General

## 2021-02-02 MED ORDER — SUCCINYLCHOLINE CHLORIDE 20 MG/ML IJ SOLN
INTRAMUSCULAR | Status: DC | PRN
  Administered 2021-02-02: 200 mg via INTRAVENOUS

## 2021-02-02 MED ORDER — IOHEXOL 300 MG/ML  SOLN
150.0000 mL | Freq: Once | INTRAMUSCULAR | Status: AC | PRN
  Administered 2021-02-02: 100 mL via INTRA_ARTERIAL

## 2021-02-02 MED ORDER — SENNOSIDES-DOCUSATE SODIUM 8.6-50 MG PO TABS
1.0000 | ORAL_TABLET | Freq: Every day | ORAL | Status: DC
  Administered 2021-02-05 – 2021-02-11 (×7): 1 via ORAL
  Filled 2021-02-02 (×8): qty 1

## 2021-02-02 MED ORDER — ACETAMINOPHEN 650 MG RE SUPP: 650.0000 mg | RECTAL | Status: DC | PRN

## 2021-02-02 MED ORDER — NITROGLYCERIN 1 MG/10 ML FOR IR/CATH LAB
INTRA_ARTERIAL | Status: AC
  Filled 2021-02-02: qty 10

## 2021-02-02 MED ORDER — FENTANYL CITRATE (PF) 100 MCG/2ML IJ SOLN
INTRAMUSCULAR | Status: DC | PRN
  Administered 2021-02-02 (×2): 50 ug via INTRAVENOUS

## 2021-02-02 MED ORDER — LIDOCAINE 2% (20 MG/ML) 5 ML SYRINGE
INTRAMUSCULAR | Status: DC | PRN
  Administered 2021-02-02: 40 mg via INTRAVENOUS

## 2021-02-02 MED ORDER — SODIUM CHLORIDE 0.9 % IV SOLN: INTRAVENOUS | Status: DC | PRN

## 2021-02-02 MED ORDER — ONDANSETRON HCL 4 MG/2ML IJ SOLN
INTRAMUSCULAR | Status: DC | PRN
  Administered 2021-02-02: 4 mg via INTRAVENOUS

## 2021-02-02 MED ORDER — CLEVIDIPINE BUTYRATE 0.5 MG/ML IV EMUL
INTRAVENOUS | Status: AC
  Administered 2021-02-03: 21 mg/h via INTRAVENOUS
  Filled 2021-02-02: qty 50

## 2021-02-02 MED ORDER — VERAPAMIL HCL 2.5 MG/ML IV SOLN
INTRAVENOUS | Status: AC
  Filled 2021-02-02: qty 2

## 2021-02-02 MED ORDER — ACETAMINOPHEN 325 MG PO TABS: 650.0000 mg | ORAL_TABLET | ORAL | Status: DC | PRN

## 2021-02-02 MED ORDER — ROCURONIUM BROMIDE 100 MG/10ML IV SOLN
INTRAVENOUS | Status: DC | PRN
  Administered 2021-02-02: 80 mg via INTRAVENOUS

## 2021-02-02 MED ORDER — ACETAMINOPHEN 160 MG/5ML PO SOLN: 650.0000 mg | ORAL | Status: DC | PRN

## 2021-02-02 MED ORDER — PROPOFOL 10 MG/ML IV BOLUS
INTRAVENOUS | Status: DC | PRN
  Administered 2021-02-02: 40 mg via INTRAVENOUS
  Administered 2021-02-02: 50 mg via INTRAVENOUS
  Administered 2021-02-02: 150 mg via INTRAVENOUS

## 2021-02-02 MED ORDER — CEFAZOLIN SODIUM-DEXTROSE 2-3 GM-%(50ML) IV SOLR
INTRAVENOUS | Status: DC | PRN
  Administered 2021-02-02: 2 g via INTRAVENOUS

## 2021-02-02 MED ORDER — PANTOPRAZOLE SODIUM 40 MG IV SOLR
40.0000 mg | Freq: Every day | INTRAVENOUS | Status: DC
  Administered 2021-02-03: 40 mg via INTRAVENOUS
  Filled 2021-02-02: qty 40

## 2021-02-02 MED ORDER — FENTANYL CITRATE (PF) 100 MCG/2ML IJ SOLN
INTRAMUSCULAR | Status: AC
  Filled 2021-02-02: qty 2

## 2021-02-02 MED ORDER — IOHEXOL 300 MG/ML  SOLN
50.0000 mL | Freq: Once | INTRAMUSCULAR | Status: AC | PRN
  Administered 2021-02-02: 50 mL via INTRA_ARTERIAL

## 2021-02-02 MED ORDER — CLEVIDIPINE BUTYRATE 0.5 MG/ML IV EMUL
INTRAVENOUS | Status: DC | PRN
  Administered 2021-02-02: 1 mg/h via INTRAVENOUS

## 2021-02-02 MED ORDER — CEFAZOLIN SODIUM-DEXTROSE 2-4 GM/100ML-% IV SOLN
INTRAVENOUS | Status: AC
  Filled 2021-02-02: qty 100

## 2021-02-02 MED ORDER — STROKE: EARLY STAGES OF RECOVERY BOOK
Freq: Once | Status: AC
  Filled 2021-02-02: qty 1

## 2021-02-02 MED ORDER — PHENYLEPHRINE HCL-NACL 10-0.9 MG/250ML-% IV SOLN
INTRAVENOUS | Status: DC | PRN
  Administered 2021-02-02: 50 ug/min via INTRAVENOUS

## 2021-02-02 MED ORDER — SODIUM CHLORIDE (PF) 0.9 % IJ SOLN
INTRAVENOUS | Status: AC | PRN
  Administered 2021-02-02 (×2): 25 ug via INTRA_ARTERIAL

## 2021-02-02 MED ORDER — IOHEXOL 350 MG/ML SOLN
100.0000 mL | Freq: Once | INTRAVENOUS | Status: AC | PRN
  Administered 2021-02-02: 100 mL via INTRAVENOUS

## 2021-02-02 MED ORDER — SODIUM CHLORIDE 0.9 % IV SOLN: INTRAVENOUS | Status: DC

## 2021-02-02 NOTE — ED Notes (Signed)
Pt placed on cardiac monitor with BP to set cycle every 30 minutes. Continuous pulse oximeter applied.  

## 2021-02-02 NOTE — Code Documentation (Signed)
Responded to Code IR paged out at 2130 on pt coming from [facility]. Pt arrived at 2153 and was escorted to [facility] at 2157.

## 2021-02-02 NOTE — Consult Note (Signed)
[facility] TELEMEDICINE  CONSULT   Date of service: February 02, 2021 Patient Name: Matthew Wise MRN:  [id number] DOB:  [birth year] Requesting Provider: Fredia Sorrow Location of the provider: Home Location of the patient: [facility] Reason for consult: "Stroke code"   This consult was provided via telemedicine with 2-way video and audio communication. The patient/family was informed that care would be provided in this way and agreed to receive care in this manner.    History of Present Illness  Matthew Wise is a 60 y.o. male with PMH significant for recent RMCA stroke, DVT/Pe on Eliquis, hx of CKD, sickle cell disease, HTN, HLD who presents with acute onset arm and leg weakness and left facial droop with a last known well of 1500 on 02/02/2021.  Spoke to patient's sister Ms. any Latin over the phone and she reported the patient was at his baseline and fixed himself a meal at 1500 and left her house for his home. Patient's girlfriend called EMS reporting left-sided weakness with an unknown last known well.  I could not get in touch with patient's girlfriend or his friend to confirm the last known well.  Work-up with CT head without contrast demonstrating suspicion for an acute infarction of the right frontoparietal lobes, posterior superior to chronic infarct with probable involvement of the lateral precentral and postcentral gyrus.  Aspects of 6 on my read.  CT angio was very limited due to low contrast opacification but notable for a right MCA M1 filling defect.  CT perfusion demonstrated a mismatch of 57 mL and a core of 84 mL with a mismatch ratio 1.7.  Of note, he was just admitted to the stroke service at [facility] for a right posterior temporal and parietal lobe infarct consistent with an inferior division right MCA infarction.  Work-up at that time did not demonstrate an LVO on CT angio.  Eliquis 5 mg twice a day was continued at discharge for  cardioembolic strokes.  Stroke Measures   Last Known Well: 1500 on 02/02/21 TPA Given: no, on Eliquis IR Thrombectomy: Dr. Estanislado Pandy discussed details of Angiogram and potential thrombectomy/stening with patient's sister Matthew Wise along with its risk and benefits. She took some time to think about the procedure and consented after discussion with her other sister. mRS: Modified Rankin Scale: 1-No significant post stroke disability and can perform usual duties with stroke symptoms Time of teleneurologist evaluation: 1958   Vitals   Vitals:   02/02/21 2000  Pulse: (!) 57  Resp: 12  Temp: 98 F (36.7 C)  TempSrc: Oral  SpO2: 100%     There is no height or weight on file to calculate BMI.   Tele-neuro Exam and NIHSS   General: Awake, alert  NIHSS components Score: Comment  1a Level of Conscious 0[x]  1[]  2[]  3[]      1b LOC Questions 0[x]  1[]  2[]       1c LOC Commands 0[x]  1[]  2[]       2 Best Gaze 0[x]  1[]  2[]       3 Visual 0[x]  1[]  2[]  3[]      4 Facial Palsy 0[]  1[x]  2[]  3[]      5a Motor Arm - left 0[]  1[]  2[]  3[]  4[x]  UN[]    5b Motor Arm - Right 0[x]  1[]  2[]  3[]  4[]  UN[]    6a Motor Leg - Left 0[]  1[]  2[]  3[]  4[x]  UN[]    6b Motor Leg - Right 0[x]  1[]  2[]  3[]  4[]  UN[]    7 Limb  Ataxia 0[x]  1[]  2[]  3[]  UN[]     8 Sensory 0[]  1[]  2[x]  UN[]      9 Best Language 0[x]  1[]  2[]  3[]      10 Dysarthria 0[x]  1[]  2[]  UN[]      11 Extinct. and Inattention 0[x]  1[]  2[]       TOTAL: 11     Other exam findings: none  Imaging and Labs   CBC:  Recent Labs  Lab 01/27/21 0202 02/02/21 1951  WBC 5.4 6.2  NEUTROABS 2.7 4.6  HGB 13.1 13.3  HCT 38.0* 39.8  MCV 82.8 86.3  PLT 198 161    Basic Metabolic Panel:  Lab Results  Component Value Date   NA 139 01/27/2021   K 3.3 (L) 01/27/2021   CO2 28 01/27/2021   GLUCOSE 122 (H) 01/27/2021   BUN 12 01/27/2021   CREATININE 1.51 (H) 01/27/2021   CALCIUM 9.6 01/27/2021   GFRNONAA 53 (L) 01/27/2021   GFRAA 41 (L) 12/19/2020    Lipid Panel:  Lab Results  Component Value Date   LDLCALC 54 01/26/2021   HgbA1c:  Lab Results  Component Value Date   HGBA1C 5.9 (H) 01/26/2021   Urine Drug Screen:     Component Value Date/Time   LABOPIA NONE DETECTED 01/25/2021 1952   COCAINSCRNUR NONE DETECTED 01/25/2021 1952   LABBENZ NONE DETECTED 01/25/2021 1952   AMPHETMU NONE DETECTED 01/25/2021 1952   THCU POSITIVE (A) 01/25/2021 1952   LABBARB NONE DETECTED 01/25/2021 1952    Alcohol Level     Component Value Date/Time   ETH <10 01/25/2021 1916   CT head without contrast: IMPRESSION: No acute intracranial hemorrhage. Suspect acute infarction of the right frontoparietal lobes posterosuperior to chronic infarct with probable involvement of lateral precentral and postcentral gyri.  These results were called by telephone at the time of interpretation on 02/02/2021 at 7:57 pm to provider Fredia Sorrow , who verbally acknowledged these results.  CT angio head and neck: Right MCA M1 segment occlusion. Fetal PCAs.  CT perfusion: Core of 84 mL along with a mismatch of 57 mL.  Impression   Matthew Wise is a 60 y.o. male with PMH significant for cardioembolic strokes and most recently  RMCA stroke, DVT/Pe on Eliquis, hx of CKD, sickle cell disease, HTN, HLD who presents with acute onset arm and leg weakness and left facial droop with a last known well of 1500 on 02/02/2021 with a NIH stroke scale of 11.  CT head with with suspected acute stroke in the right MCA division posterior superior to the chronic infarct and with concern for involvement of the precentral and postcentral gyrus.  CT angio with a right MCA M1 occlusion with poor overall opacification of the arteries and CT perfusion with a penumbra of 57 mL.  Given his presentation with left hemiplegia and right M1 occlusion, discussed this case with Dr. Estanislado Pandy and we spoke with the patient's sister Matthew Wise and discussed the risks and benefits of an  angiogram with potential thrombectomy and/or stent placement.  Given his recent strokes and concern for small caliber of his MCAs, discussed that this is a high risk procedure.  She understands risks and benefits and consented.  Recommendations  Acute R MCA stroke:  - Transfer to [facility] for emergent Angiogram and/or thrombectomy and stenting. - Admit to Neuro ICU. - Frequent Neuro checks per stroke unit protocol - Recommend brain imaging with MRI Brain without contrast - Recommend Vascular imaging with MRA Angio Head without contrast and  US Carotid doppler - Recommend obtaining TTE - Recommend obtaining Lipid panel with LDL - Please start statin if LDL > 70 - Recommend HbA1c - Antithrombotic - Per Neuro IR after thrombectomy/stenting - Recommend DVT ppx - SBP goal - per Neuro IR after thrombectomy/stenting. - Recommend Telemetry monitoring for arrythmia - Recommend bedside swallow screen prior to PO intake. - Stroke education booklet - Recommend PT/OT/SLP consult   ______________________________________________________________________    This patient is receiving care for possible acute neurological changes. There was 90 minutes of care by this provider at the time of service, including time for direct evaluation via telemedicine, review of medical records, imaging studies and discussion of findings with providers, the patient and/or family.  Donnetta Simpers [facility] Pager Number [phone]  If 7pm- 7am, please page neurology on call as listed in [place].

## 2021-02-02 NOTE — H&P (Addendum)
Neurology H&P  [name] MR# [id number] 02/02/2021   CC: Code stroke/IR for potential thrombectomy/stening   History is obtained from: Neurology, patient, chart  HPI: Matthew Wise is a 60 y.o. 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   The following information taken from Dr. Bartholome Bill note 02/02/2021:   "RMCA stroke, DVT/Pe on Eliquis, hx of CKD, sickle cell disease, HTN, HLD who presents with acute onset arm and leg weakness and left facial droop with a last known well of 1500 on 02/02/2021.  Spoke to patient's sister Ms. any Brinegar over the phone and she reported the patient was at his baseline and fixed himself a meal at 1500 and left her house for his home. Patient's girlfriend called EMS reporting left-sided weakness with an unknown last known well.  I could not get in touch with patient's girlfriend or his friend to confirm the last known well.  Work-up with CT head without contrast demonstrating suspicion for an acute infarction of the right frontoparietal lobes, posterior superior to chronic infarct with probable involvement of the lateral precentral and postcentral gyrus.  Aspects of 6 on my read.  CT angio was very limited due to low contrast opacification but notable for a right MCA M1 filling defect.  CT perfusion demonstrated a mismatch of 57 mL and a core of 84 mL with a mismatch ratio 1.7.  Of note, he was just admitted to the stroke service at [facility] for a right posterior temporal and parietal lobe infarct consistent with an inferior division right MCA infarction.  Work-up at that time did not demonstrate an LVO on CT angio.  Eliquis 5 mg twice a day was continued at discharge for cardioembolic strokes."  LKW: [id number] on 02/02/21 tpa given: on apixaban IR Thrombectomy TBD Modified Rankin Scale: 1-No significant post stroke disability  and can perform usual duties with stroke symptoms NIHSS: 11  ROS: A complete ROS was performed and is negative except as noted in the HPI.   Past Medical History:  Diagnosis Date  . Acute CVA (cerebrovascular accident) ([place]) 09/09/2020  . Chronic anticoagulation 11/14/2014  . CKD (chronic kidney disease) stage 3, GFR 30-59 ml/min (HCC) 10/24/2019  . CKD (chronic kidney disease) stage 3, GFR 30-59 ml/min (HCC) 10/24/2019  . Clotting disorder ([place])   . 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 DVT (deep venous thrombosis) ([place]) 06/15/2011  . DVT of leg (deep venous thrombosis) (HCC)    rt. leg  . Essential hypertension 11/28/2020  . H/O PE (pulmonary embolism) 06/15/2011  . Hyperlipidemia 11/28/2020  . 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 Prediabetes 10/24/2019  . Thyroid nodule 01/25/2021    Family History  Problem Relation Age of Onset  . COPD Father   . Stroke Brother   . Pulmonary embolism Sister   . Throat cancer Brother   . Arthritis Mother   . Diabetes Mother   . Stroke Mother   . Hypertension Brother   . Diabetes Brother   . Arthritis Brother   . Diabetes Sister   . Colon cancer Neg Hx    Social History:  reports that he has been smoking cigarettes. He started smoking about 37 years ago. He has a 30.00 pack-year smoking history. He has never used smokeless tobacco. He  reports current alcohol use of about 3.0 standard drinks of alcohol per week. He reports previous drug use.   Prior to Admission medications   Medication Sig Start Date End Date Taking? Authorizing Provider  amLODipine (NORVASC) 5 MG tablet TAKE 1 TABLET (5 MG TOTAL) BY MOUTH DAILY. 01/07/21   Doree Albee, MD  apixaban (ELIQUIS) 5 MG TABS tablet Take 1 tablet (5 mg total) by mouth 2 (two) times daily. 01/08/21   Hurshel Party C, MD  atorvastatin (LIPITOR) 40 MG tablet TAKE 1 TABLET BY MOUTH EVERY DAY  12/23/20   Hurshel Party C, MD  fluticasone (FLONASE) 50 MCG/ACT nasal spray Place 1 spray into both nostrils daily.    [provider]  levothyroxine (SYNTHROID) 25 MCG tablet TAKE 1 TABLET BY MOUTH EVERY DAY 01/07/21   Doree Albee, MD  Multiple Vitamins-Minerals (CENTRUM ADULTS PO) Take 1 tablet by mouth daily.    [provider]    Exam: Current vital signs: BP (!) 197/81   Pulse 61   Temp 98 F (36.7 C) (Oral)   Resp 19   SpO2 100%   Physical Exam  Constitutional: Appears well-developed and well-nourished.  Psych: Affect appropriate to situation Eyes: No scleral injection HENT: No OP obstruction. Head: Normocephalic.  Cardiovascular: Normal rate and regular rhythm.  Respiratory: Effort normal, symmetric excursions bilaterally, no audible wheezing. GI: Soft.  No distension. There is no tenderness.  Skin: WDI  Neuro: Mental Status: Patient is awake, alert, oriented to person, place, and situation. Patient is not able to give a complete history. Speech limited intact comprehension.. No signs of aphasia or neglect. Visual Fields are full. Pupils are equal, round, and reactive to light. EOMI without ptosis or diploplia.  Facial sensation decreased on left Facial left droop.  Hearing is intact to voice. Uvula midline and palate elevates symmetrically. Shoulder shrug is symmetric. Tongue is midline without atrophy or fasciculations.  Tone is normal. Bulk is normal. Left upper and lower extremity weakness. Sensation is decreased on left to light touch and temperature in the arms and legs. Deep Tendon Reflexes: 2+ and symmetric in the biceps and patellae. Toes up going on left. FNF and HKS unable to perform on left. Gait - Deferred  I have reviewed labs in epic and the pertinent results are:   Ref. Range 01/26/2021 05:48  LDL (calc) Latest Ref Range: 0 - 99 mg/dL 54  Triglycerides Latest Ref Range: <150 mg/dL 85    Ref. Range 01/26/2021 05:48   Hemoglobin A1C Latest Ref Range: 4.8 - 5.6 % 5.9 (H)   I have reviewed the images obtained: CTA head and neck and CT perfusion showed abrupt occlusion of the right M1 segment, new as compared to prior CTA from 01/25/2021. There is suggestion of an underlying stenosis at this level on prior CTA from 01/25/2021. Acute core infarct at the posterior right frontoparietal region, largely corresponding with the completed infarct as seen on prior MRI. This may have partially expanded in the interim. Surrounding ischemic penumbra of 57 cc, considered tissue at risk.  Assessment: Matthew Wise is a 60 y.o. 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    Plan: - Admit to Neuro ICU. - Frequent Neuro checks per stroke unit protocol - MRI brain without contrast. - May not need vascular imaging if  he undergoes diagnostic angiography and has already had CTA head and neck 02/02/2021. - Continue statin - Antithrombotic - Resume after NCT head if negative. SBP goal 120-140. - Telemetry monitoring for arrhythmia. - Recommend bedside Swallow screen. - Recommend Stroke education. - Recommend PT/OT/SLP consult. - Recommend DVT SCDs for now and resume tomorrow.  This patient is critically ill and at significant risk of neurological worsening, death and care requires constant monitoring of vital signs, hemodynamics,respiratory and cardiac monitoring, neurological assessment, discussion with family, other specialists and medical decision making of high complexity. I spent 73 minutes of neurocritical care time  in the care of  this patient. This was time spent independent of any time provided by nurse practitioner or PA.  Electronically signed by:  Lynnae Sandhoff, MD Page: [phone] 02/02/2021, 11:00 PM

## 2021-02-02 NOTE — ED Triage Notes (Signed)
Unknown LKW. Left sided weakness, hx of multiple strokes.

## 2021-02-02 NOTE — Anesthesia Procedure Notes (Signed)
Procedure Name: Intubation Date/Time: 02/02/2021 10:07 PM Performed by: Suzy Bouchard, CRNA Pre-anesthesia Checklist: Patient identified, Emergency Drugs available, Suction available, Patient being monitored and Timeout performed Patient Re-evaluated:Patient Re-evaluated prior to induction Oxygen Delivery Method: Ambu bag Preoxygenation: Pre-oxygenation with 100% oxygen Induction Type: IV induction, Rapid sequence and Cricoid Pressure applied Laryngoscope Size: Glidescope and 4 Grade View: Grade I Tube type: Oral Tube size: 7.5 mm Number of attempts: 1 Airway Equipment and Method: Stylet and Video-laryngoscopy Placement Confirmation: ETT inserted through vocal cords under direct vision,  CO2 detector and breath sounds checked- equal and bilateral Secured at: 24 cm Tube secured with: Tape Dental Injury: Teeth and Oropharynx as per pre-operative assessment

## 2021-02-02 NOTE — ED Notes (Signed)
Brief report to Janett Billow, Writer at [facility] ED.

## 2021-02-02 NOTE — Sedation Documentation (Signed)
Message sent to Dr. Theda Sers to place admit orders ASAP.

## 2021-02-02 NOTE — ED Notes (Signed)
Patient transported to CT via monitored stretcher.

## 2021-02-02 NOTE — ED Notes (Signed)
Report given to [facility] EMS providers. Pt left facility via stretcher at this time. Pts clothing sent with his sisters. Pts phone, charging cord, and block charger labeled with pts stickers and placed in belongings bag and sent with patient to [facility].

## 2021-02-02 NOTE — ED Notes (Signed)
Pt directly to CT scan on arrival with blood sugar checked in CT of 91.

## 2021-02-02 NOTE — ED Notes (Signed)
Neuro reports pt will be transported to [facility] for IR. [place]man explanation provided to patient by this nurse and neurologist. Pt verbalizes understanding.

## 2021-02-02 NOTE — ED Notes (Signed)
Neurology and Radiology physicians on teleneurology to discuss patient's care, they spoke with pts family at length, this nurse remains at bedside for assistance with questions with patient.

## 2021-02-02 NOTE — Anesthesia Preprocedure Evaluation (Addendum)
Anesthesia Evaluation  Patient identified by MRN, date of birth, ID band Patient awake    Reviewed: Allergy & Precautions, NPO status , Patient's Chart, lab work & pertinent test results  History of Anesthesia Complications Negative for: history of anesthetic complications  Airway Mallampati: II  TM Distance: >3 FB Neck ROM: Full    Dental  (+) Teeth Intact, Dental Advisory Given   Pulmonary Current SmokerPatient did not abstain from smoking.,  02/02/2021 SARS coronavirus NEG   breath sounds clear to auscultation       Cardiovascular hypertension, Pt. on medications (-) angina+ DVT (h/o IVC thrombosis)   Rhythm:Regular Rate:Normal  01/22/2021 ECHO: EF 55-60%. The LV has normal function. The left ventricle demonstrates apical hypokinesis, mild LVH. No significant valvular abnormalities   Neuro/Psych CVA (recent CVA 2/26, but now with new L sided weakness)    GI/Hepatic Neg liver ROS, Nausea earlier today   Endo/Other  Hypothyroidism obese  Renal/GU Renal InsufficiencyRenal disease (creat 1.83)     Musculoskeletal   Abdominal (+) + obese,   Peds  Hematology eliquis   Anesthesia Other Findings   Reproductive/Obstetrics                            Anesthesia Physical Anesthesia Plan  ASA: III and emergent  Anesthesia Plan: General   Post-op Pain Management:    Induction: Intravenous and Rapid sequence  PONV Risk Score and Plan: 1 and Ondansetron  Airway Management Planned: Oral ETT  Additional Equipment: Arterial line  Intra-op Plan:   Post-operative Plan: Possible Post-op intubation/ventilation  Informed Consent: I have reviewed the patients History and Physical, chart, labs and discussed the procedure including the risks, benefits and alternatives for the proposed anesthesia with the patient or authorized representative who has indicated his/her understanding and acceptance.      Dental advisory given  Plan Discussed with: CRNA and Surgeon  Anesthesia Plan Comments:        Anesthesia Quick Evaluation

## 2021-02-02 NOTE — Anesthesia Procedure Notes (Signed)
Arterial Line Insertion Start/End3/04/2021 10:20 PM, 02/02/2021 10:21 PM Performed by: Suzy Bouchard, CRNA, CRNA  Patient location: OOR procedure area. Preanesthetic checklist: patient identified, IV checked, monitors and equipment checked and timeout performed Emergency situation Patient sedated radial was placed Catheter size: 20 G Hand hygiene performed , maximum sterile barriers used  and Seldinger technique used  Attempts: 1 Procedure performed without using ultrasound guided technique. Following insertion, dressing applied and Biopatch. Post procedure assessment: normal  Patient tolerated the procedure well with no immediate complications.

## 2021-02-02 NOTE — ED Provider Notes (Signed)
[facility] EMERGENCY DEPARTMENT Provider Note   CSN: [id number] Arrival date & time: 02/02/21  1938     History Chief Complaint  Patient presents with  . Weakness  . Code Stroke    Matthew Wise is a 60 y.o. male.  Patient brought in by EMS.  For new left-sided weakness.  Patient had a stroke the end of February to the right side of the brain with involving left side.  Patient stated that he was at his baseline yesterday.  Stated normally he could move his left arm and left leg.  Patient verbalizing okay.  But we do not have any exact known last normal.  Patient also denied any visual changes.  Patient with no movement to his left arm left leg.  Code stroke was activated upon patient's arrival.        Past Medical History:  Diagnosis Date  . Acute CVA (cerebrovascular accident) ([place]) 09/09/2020  . Chronic anticoagulation 11/14/2014  . CKD (chronic kidney disease) stage 3, GFR 30-59 ml/min (HCC) 10/24/2019  . CKD (chronic kidney disease) stage 3, GFR 30-59 ml/min (HCC) 10/24/2019  . Clotting disorder ([place])   . 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 DVT (deep venous thrombosis) ([place]) 06/15/2011  . DVT of leg (deep venous thrombosis) (HCC)    rt. leg  . Essential hypertension 11/28/2020  . H/O PE (pulmonary embolism) 06/15/2011  . Hyperlipidemia 11/28/2020  . 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 Prediabetes 10/24/2019  . Thyroid nodule 01/25/2021    Patient Active Problem List   Diagnosis Date Noted  . Hypoglycemia 01/25/2021  . Thyroid nodule 01/25/2021  . Hypothyroidism 01/25/2021  . Near syncope 01/21/2021  . H/O adenomatous polyp of colon 12/30/2020  . Acute ischemic stroke ([place]) 11/28/2020  . Essential hypertension 11/28/2020  . Mixed hyperlipidemia 11/28/2020  . Acute CVA (cerebrovascular accident) ([place]) 09/09/2020  . Transient ischemic attack 09/07/2020  .  History of hypertension 09/07/2020  . Elevated troponin 09/07/2020  . Dizziness 09/07/2020  . Numbness and tingling 09/07/2020  . H/O medication noncompliance 09/07/2020  . Medicare annual wellness visit, subsequent 12/05/2019  . Nicotine dependence 12/05/2019  . CKD (chronic kidney disease) stage 3, GFR 30-59 ml/min (HCC) 10/24/2019  . Prediabetes 10/24/2019  . Pedal edema 09/14/2017  . Tubular adenoma of colon 09/14/2017  . Chronic anticoagulation 11/14/2014  . Sickle cell trait ([place]) 08/29/2012  . 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 DVT (deep venous thrombosis) ([place]) 06/15/2011  . Pulmonary embolism ([place]) 06/15/2011  . 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 COLONOSCOPY WITH PROPOFOL N/A 10/29/2015   incomplete due to redundant colon. two simple tubular adenomas removed. patient was advised to go to [facility] for colonoscopy but he did not keep appt.  . TEE WITHOUT CARDIOVERSION N/A 09/10/2020   Procedure: TRANSESOPHAGEAL ECHOCARDIOGRAM (TEE) WITH PROPOFOL;  Surgeon: Satira Sark, MD;  Location: [facility] ORS;  Service: Cardiovascular;  Laterality: N/A;       Family History  Problem Relation Age of Onset  . COPD Father   . Stroke Brother   . Pulmonary embolism Sister   . Throat cancer Brother   . Arthritis Mother   . Diabetes Mother   . Stroke Mother   . Hypertension Brother   . Diabetes Brother   .  Arthritis Brother   . Diabetes Sister   . Colon cancer Neg Hx     Social History   Tobacco Use  . Smoking status: Current Every Day Smoker    Packs/day: 1.00    Years: 30.00    Pack years: 30.00    Types: Cigarettes    Start date: 12/01/1983  . Smokeless tobacco: Never Used  . Tobacco comment: counseled  Vaping Use  . Vaping Use: Never used  Substance Use Topics  . Alcohol use: Yes    Alcohol/week: 3.0 standard drinks    Types: 3 Cans of beer  per week    Comment: 2-3 beers a day; 12/30/20 "I haven't drank anything in past month or so"  . Drug use: Not Currently    Comment: REMOTE HISTORY, Quit in 2007    Home Medications Prior to Admission medications   Medication Sig Start Date End Date Taking? Authorizing Provider  amLODipine (NORVASC) 5 MG tablet TAKE 1 TABLET (5 MG TOTAL) BY MOUTH DAILY. 01/07/21   Doree Albee, MD  apixaban (ELIQUIS) 5 MG TABS tablet Take 1 tablet (5 mg total) by mouth 2 (two) times daily. 01/08/21   Hurshel Party C, MD  atorvastatin (LIPITOR) 40 MG tablet TAKE 1 TABLET BY MOUTH EVERY DAY 12/23/20   Hurshel Party C, MD  fluticasone (FLONASE) 50 MCG/ACT nasal spray Place 1 spray into both nostrils daily.    [provider]  levothyroxine (SYNTHROID) 25 MCG tablet TAKE 1 TABLET BY MOUTH EVERY DAY 01/07/21   Doree Albee, MD  Multiple Vitamins-Minerals (CENTRUM ADULTS PO) Take 1 tablet by mouth daily.    [provider]    Allergies    Patient has no known allergies.  Review of Systems   Review of Systems  Constitutional: Negative for chills and fever.  HENT: Negative for rhinorrhea and sore throat.   Eyes: Negative for visual disturbance.  Respiratory: Negative for cough and shortness of breath.   Cardiovascular: Negative for chest pain and leg swelling.  Gastrointestinal: Negative for abdominal pain, diarrhea, nausea and vomiting.  Genitourinary: Negative for dysuria.  Musculoskeletal: Negative for back pain and neck pain.  Skin: Negative for rash.  Neurological: Positive for weakness. Negative for dizziness, light-headedness and headaches.  Hematological: Does not bruise/bleed easily.  Psychiatric/Behavioral: Negative for confusion.    Physical Exam Updated Vital Signs BP (!) 197/81   Pulse (!) 51   Temp 98 F (36.7 C) (Oral)   Resp 17   SpO2 100%   Physical Exam Vitals and nursing note reviewed.  Constitutional:      Appearance: Normal appearance. He is  well-developed and well-nourished.  HENT:     Head: Normocephalic and atraumatic.  Eyes:     Extraocular Movements: Extraocular movements intact.     Conjunctiva/sclera: Conjunctivae normal.     Pupils: Pupils are equal, round, and reactive to light.  Cardiovascular:     Rate and Rhythm: Normal rate and regular rhythm.     Heart sounds: No murmur heard.   Pulmonary:     Effort: Pulmonary effort is normal. No respiratory distress.     Breath sounds: Normal breath sounds.  Abdominal:     Palpations: Abdomen is soft.     Tenderness: There is no abdominal tenderness.  Musculoskeletal:        General: No edema.     Cervical back: Normal range of motion and neck supple.  Skin:    General: Skin is warm and dry.  Neurological:     General: No focal deficit present.     Mental Status: He is alert and oriented to person, place, and time.     Motor: Weakness present.     Comments: Left upper extremity left lower extremity marked weakness.  Psychiatric:        Mood and Affect: Mood and affect normal.     ED Results / Procedures / Treatments   Labs (all labs ordered are listed, but only abnormal results are displayed) Labs Reviewed  COMPREHENSIVE METABOLIC PANEL - Abnormal; Notable for the following components:      Result Value   Glucose, Bld 123 (*)    Creatinine, Ser 1.83 (*)    GFR, Estimated 42 (*)    All other components within normal limits  RESP PANEL BY RT-PCR (FLU A&B, COVID) ARPGX2  ETHANOL  PROTIME-INR  APTT  CBC  DIFFERENTIAL  RAPID URINE DRUG SCREEN, HOSP PERFORMED  URINALYSIS, ROUTINE W REFLEX MICROSCOPIC  CBG MONITORING, ED  I-STAT CHEM 8, ED    EKG None  Radiology CT HEAD CODE STROKE WO CONTRAST  Result Date: 02/02/2021 CLINICAL DATA:  Code stroke.  Left-sided weakness EXAM: CT HEAD WITHOUT CONTRAST TECHNIQUE: Contiguous axial images were obtained from the base of the skull through the vertex without intravenous contrast. COMPARISON:  01/25/2021  FINDINGS: Brain: No acute intracranial hemorrhage. Suspect new loss of gray-white differentiation in the right frontoparietal lobes posterior to the chronic frontal infarct with involvement of lateral precentral and postcentral gyri. Evolution of recent prior infarction involving the right parietal and temporal lobes. Small chronic left cerebellar infarcts are again seen. Stable findings of probable chronic microvascular ischemic changes. No hydrocephalus or extra-axial collection. Vascular: No hyperdense vessel. Skull: Unremarkable. Sinuses/Orbits: No acute abnormality. Other: None. IMPRESSION: No acute intracranial hemorrhage. Suspect acute infarction of the right frontoparietal lobes posterosuperior to chronic infarct with probable involvement of lateral precentral and postcentral gyri. These results were called by telephone at the time of interpretation on 02/02/2021 at 7:57 pm to provider Fredia Sorrow , who verbally acknowledged these results. Electronically Signed   By: Macy Mis M.D.   On: 02/02/2021 20:00    Procedures Procedures    CRITICAL CARE Performed by: Fredia Sorrow Total critical care time: 60 minutes Critical care time was exclusive of separately billable procedures and treating other patients. Critical care was necessary to treat or prevent imminent or life-threatening deterioration. Critical care was time spent personally by me on the following activities: development of treatment plan with patient and/or surrogate as well as nursing, discussions with consultants, evaluation of patient's response to treatment, examination of patient, obtaining history from patient or surrogate, ordering and performing treatments and interventions, ordering and review of laboratory studies, ordering and review of radiographic studies, pulse oximetry and re-evaluation of patient's condition.   Medications Ordered in ED Medications  iohexol (OMNIPAQUE) 350 MG/ML injection 100 mL (100 mLs  Intravenous Contrast Given 02/02/21 2012)    ED Course  I have reviewed the triage vital signs and the nursing notes.  Pertinent labs & imaging results that were available during my care of the patient were reviewed by me and considered in my medical decision making (see chart for details).    MDM Rules/Calculators/A&P                          Stroke activated upon arrival despite last known normal was not very clear.  Sounds as if it was  yesterday.  But patient had significant deficit involving his left arm left leg with almost complete paralysis.  Patient verbalizing okay.  Patient sent to CT scan.  Past medical history showed that patient has had prior right-sided CVA in February.  But CT scan raise concerns for new acute area of infarct.  Teleneurologist worked through trying to find out last normal.  Did get permission from his sister family member and had discussion with the neuro hospitalist at [facility] and interventional folks at [facility] and patient is going to be transferred down to [facility] by EMS for angiogram and then if there is something that can be removed clot wise they will do that.      Final Clinical Impression(s) / ED Diagnoses Final diagnoses:  Cerebrovascular accident (CVA), unspecified mechanism [facility])    Rx / [place] Orders ED Discharge Orders    None       Fredia Sorrow, MD 02/02/21 2129

## 2021-02-02 NOTE — Sedation Documentation (Signed)
Patient placement has clean and ready bed but no admit order. Will msg Dr. Theda Sers.

## 2021-02-03 ENCOUNTER — Encounter (HOSPITAL_COMMUNITY): Payer: Self-pay | Admitting: Neurology

## 2021-02-03 ENCOUNTER — Inpatient Hospital Stay (HOSPITAL_COMMUNITY): Payer: Medicare Other

## 2021-02-03 ENCOUNTER — Other Ambulatory Visit: Payer: Self-pay

## 2021-02-03 DIAGNOSIS — I639 Cerebral infarction, unspecified: Secondary | ICD-10-CM | POA: Diagnosis not present

## 2021-02-03 DIAGNOSIS — I6601 Occlusion and stenosis of right middle cerebral artery: Secondary | ICD-10-CM | POA: Diagnosis present

## 2021-02-03 HISTORY — DX: Occlusion and stenosis of right middle cerebral artery: I66.01

## 2021-02-03 LAB — COMPREHENSIVE METABOLIC PANEL
ALT: 27 U/L (ref 0–44)
AST: 30 U/L (ref 15–41)
Albumin: 3.8 g/dL (ref 3.5–5.0)
Alkaline Phosphatase: 100 U/L (ref 38–126)
Anion gap: 10 (ref 5–15)
BUN: 14 mg/dL (ref 6–20)
CO2: 21 mmol/L — ABNORMAL LOW (ref 22–32)
Calcium: 9.1 mg/dL (ref 8.9–10.3)
Chloride: 106 mmol/L (ref 98–111)
Creatinine, Ser: 1.72 mg/dL — ABNORMAL HIGH (ref 0.61–1.24)
GFR, Estimated: 46 mL/min — ABNORMAL LOW (ref >60)
Glucose, Bld: 146 mg/dL — ABNORMAL HIGH (ref 70–99)
Potassium: 3.7 mmol/L (ref 3.5–5.1)
Sodium: 137 mmol/L (ref 135–145)
Total Bilirubin: 0.6 mg/dL (ref 0.3–1.2)
Total Protein: 6.9 g/dL (ref 6.5–8.1)

## 2021-02-03 LAB — CBC WITH DIFFERENTIAL/PLATELET
Abs Immature Granulocytes: 0.02 10*3/uL (ref 0.00–0.07)
Basophils Absolute: 0 10*3/uL (ref 0.0–0.1)
Basophils Relative: 0 %
Eosinophils Absolute: 0 10*3/uL (ref 0.0–0.5)
Eosinophils Relative: 1 %
HCT: 39.7 % (ref 39.0–52.0)
Hemoglobin: 13.5 g/dL (ref 13.0–17.0)
Immature Granulocytes: 0 %
Lymphocytes Relative: 20 %
Lymphs Abs: 1.2 10*3/uL (ref 0.7–4.0)
MCH: 28.8 pg (ref 26.0–34.0)
MCHC: 34 g/dL (ref 30.0–36.0)
MCV: 84.8 fL (ref 80.0–100.0)
Monocytes Absolute: 0.5 10*3/uL (ref 0.1–1.0)
Monocytes Relative: 8 %
Neutro Abs: 4.5 10*3/uL (ref 1.7–7.7)
Neutrophils Relative %: 71 %
Platelets: 188 10*3/uL (ref 150–400)
RBC: 4.68 MIL/uL (ref 4.22–5.81)
RDW: 13.3 % (ref 11.5–15.5)
WBC: 6.2 10*3/uL (ref 4.0–10.5)
nRBC: 0.3 % — ABNORMAL HIGH (ref 0.0–0.2)

## 2021-02-03 LAB — RAPID URINE DRUG SCREEN, HOSP PERFORMED
Amphetamines: NOT DETECTED
Barbiturates: NOT DETECTED
Benzodiazepines: NOT DETECTED
Cocaine: NOT DETECTED
Opiates: NOT DETECTED
Tetrahydrocannabinol: NOT DETECTED

## 2021-02-03 LAB — HEMOGLOBIN A1C
Hgb A1c MFr Bld: 5.9 % — ABNORMAL HIGH (ref 4.8–5.6)
Mean Plasma Glucose: 122.63 mg/dL

## 2021-02-03 LAB — URINALYSIS, ROUTINE W REFLEX MICROSCOPIC
Bilirubin Urine: NEGATIVE
Glucose, UA: NEGATIVE mg/dL
Hgb urine dipstick: NEGATIVE
Ketones, ur: NEGATIVE mg/dL
Leukocytes,Ua: NEGATIVE
Nitrite: NEGATIVE
Protein, ur: NEGATIVE mg/dL
Specific Gravity, Urine: 1.033 — ABNORMAL HIGH (ref 1.005–1.030)
pH: 5 (ref 5.0–8.0)

## 2021-02-03 LAB — LIPID PANEL
Cholesterol: 109 mg/dL (ref 0–200)
HDL: 56 mg/dL (ref >40)
LDL Cholesterol: 25 mg/dL (ref 0–99)
Total CHOL/HDL Ratio: 1.9 RATIO
Triglycerides: 141 mg/dL (ref <150)
VLDL: 28 mg/dL (ref 0–40)

## 2021-02-03 LAB — MRSA PCR SCREENING: MRSA by PCR: NEGATIVE

## 2021-02-03 LAB — GLUCOSE, CAPILLARY: Glucose-Capillary: 129 mg/dL — ABNORMAL HIGH (ref 70–99)

## 2021-02-03 IMAGING — CT CT HEAD W/O CM
4 series · 15 of 47 positions shown, 17 images · non-contrast
Comparison: CT head without contrast [DATE]. CT perfusion and
CTA head and neck [DATE]. MR head without contrast [DATE].

CLINICAL DATA: Status post revascularization of right M1 occlusion
with right MCA infarct and ischemia.

EXAM:
CT HEAD WITHOUT CONTRAST
TECHNIQUE: Contiguous axial images were obtained from the base of the skull
through the vertex without intravenous contrast.

[Series 3: head without · axial · non-contrast · 0.42mm/px · z∈[-105,+10]mm · 7 of 31 slices shown, 9 images]
[im 4/31  brain]
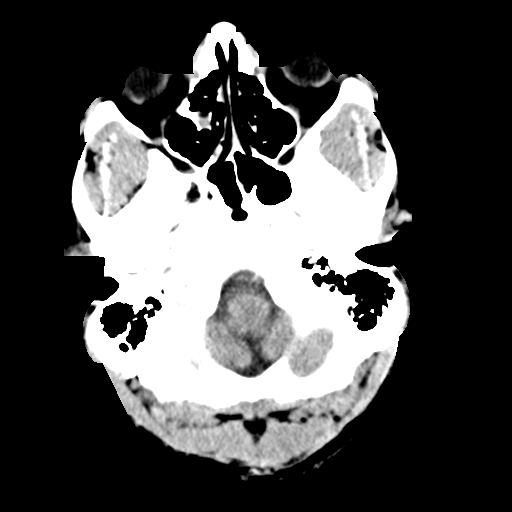
[im 4/31  bone]
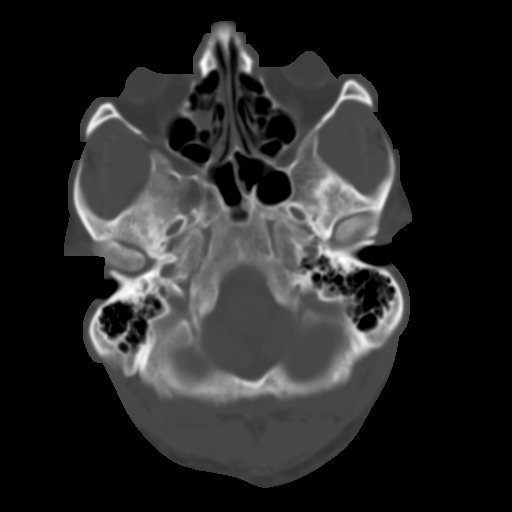
[im 8/31  brain]
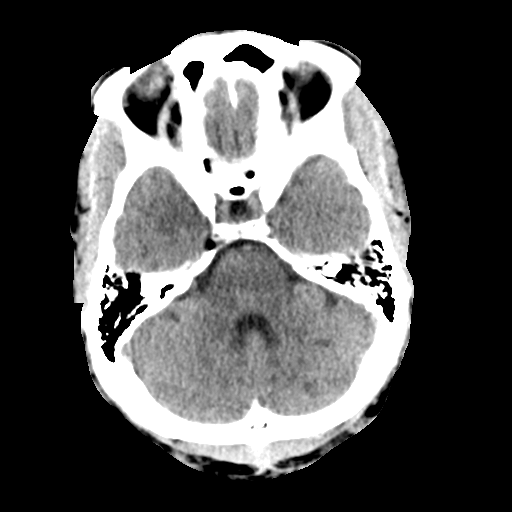
[im 12/31  brain]
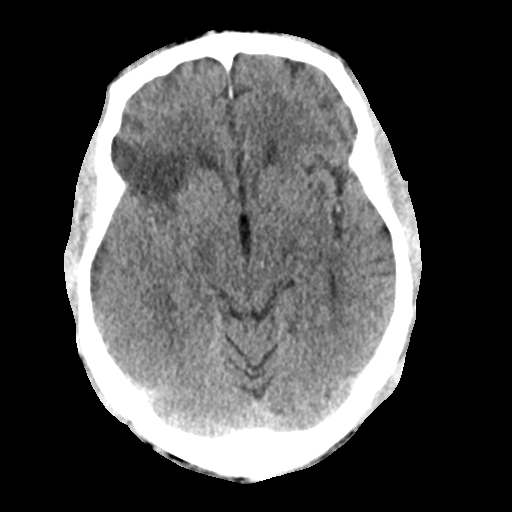
[im 16/31  brain]
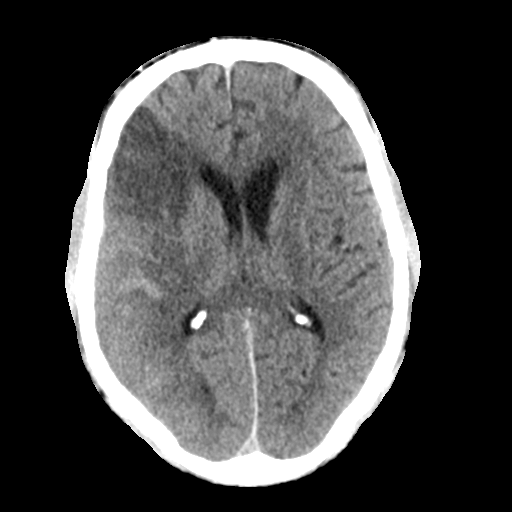
[im 19/31  brain]
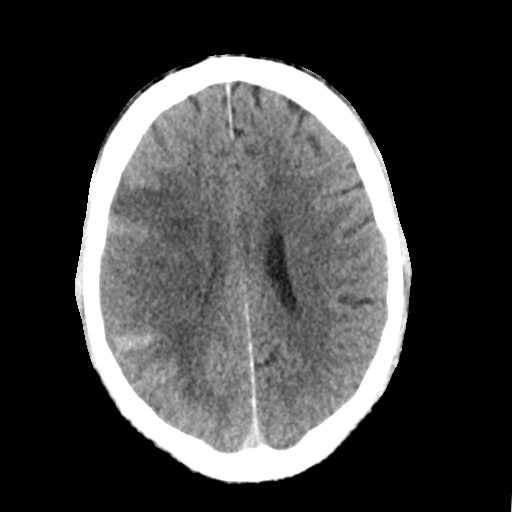
[im 19/31  bone]
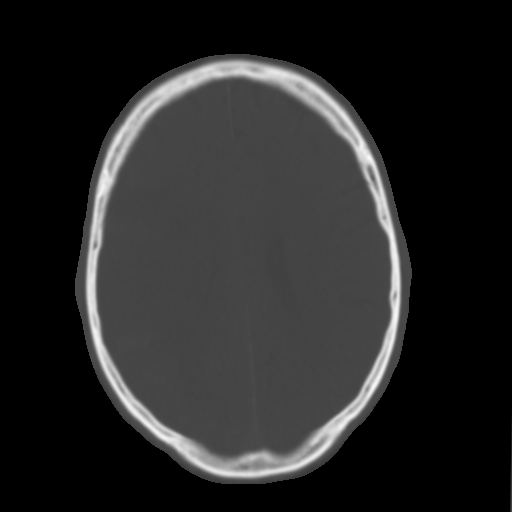
[im 23/31  brain]
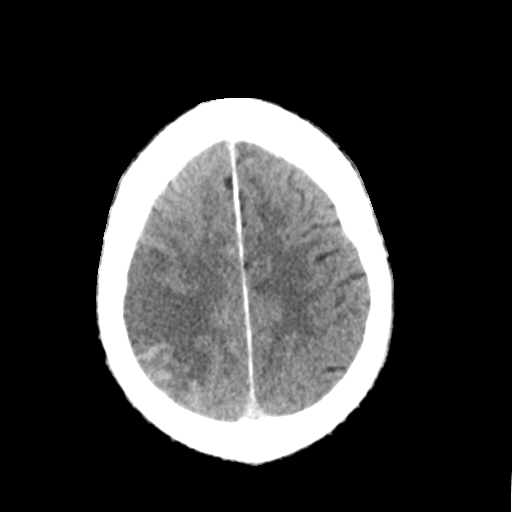
[im 27/31  brain]
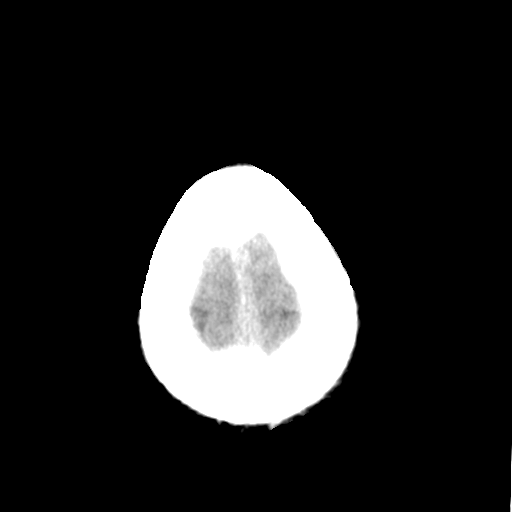

[Series 4: head bone · axial · 0.42mm/px · z∈[-106,-90]mm · 2 of 78 slices shown]
[im 8/78  bone]
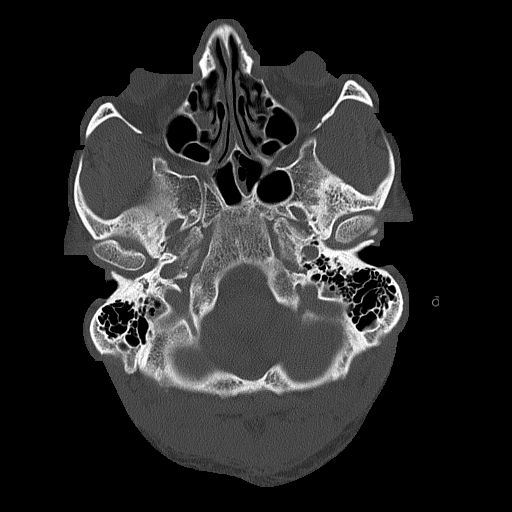
[im 16/78  bone]
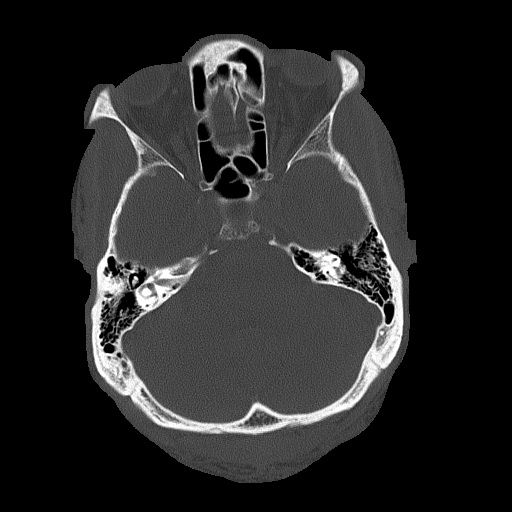

[Series 5: head without cor · coronal · non-contrast · 0.31mm/px · 3 of 70 slices shown]
[im 24/70  brain]
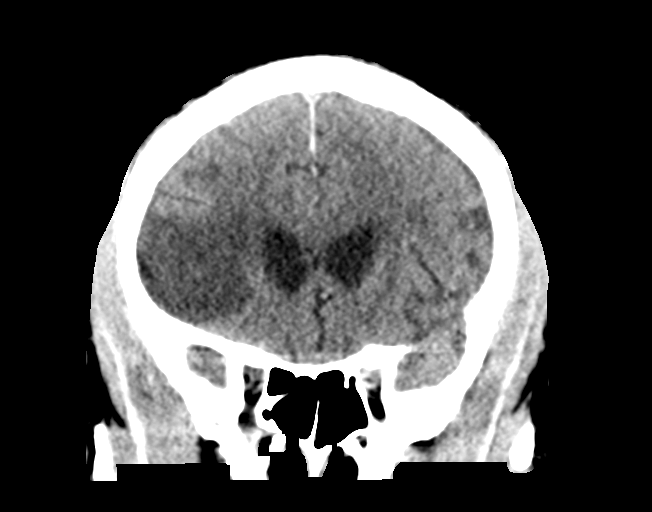
[im 31/70  brain]
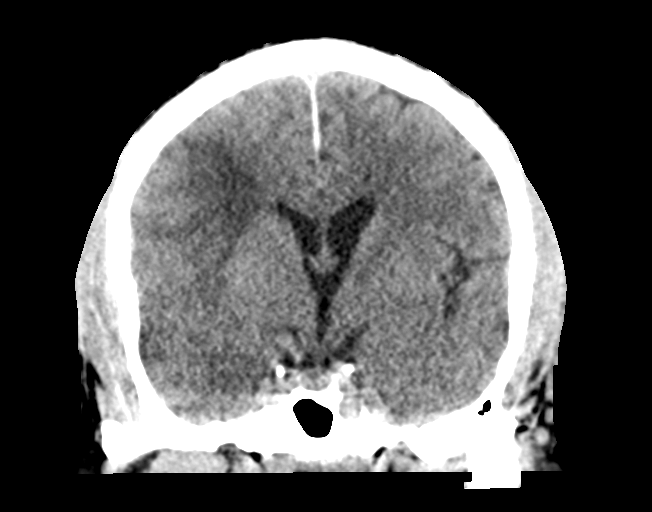
[im 39/70  brain]
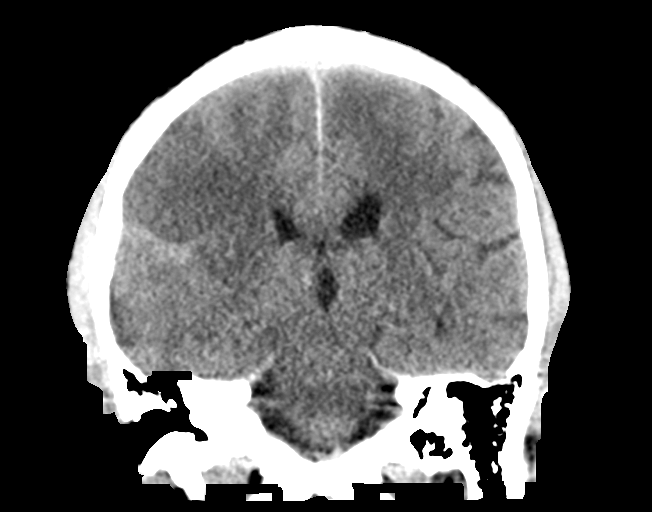

[Series 6: head without sag · sagittal · non-contrast · 0.32mm/px · 3 of 62 slices shown]
[im 21/62  brain]
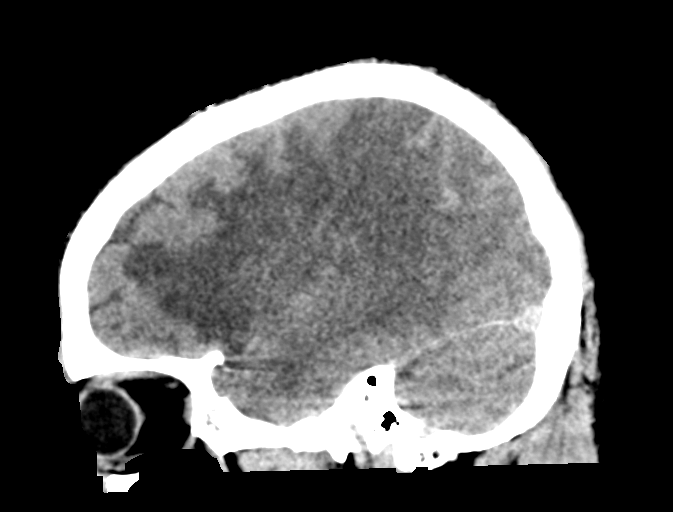
[im 31/62  brain]
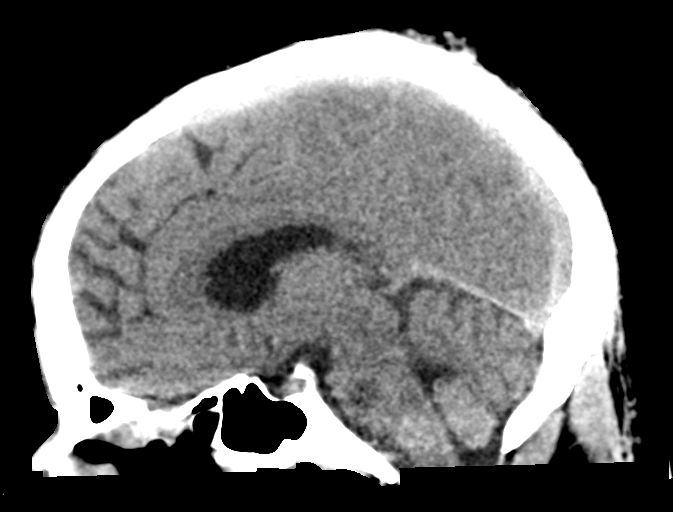
[im 41/62  brain]
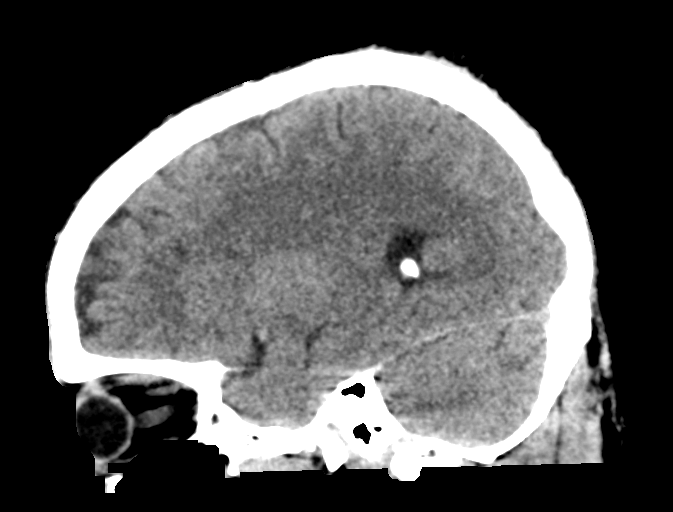

[15 of 47 positions shown; findings below may reference images not displayed]

FINDINGS: Brain: Progressive cortical infarct is present in the posterior
right MCA territory with further loss of gray-white differentiation.
Remote infarct in the anterior right MCA territory is stable.

Progressive edema and mass effect is present with effacement the
sulci on the right. Hyperdense areas in the sulci likely reflect
pseudo subarachnoid hemorrhage with areas of cortex adjacent to one
another. No definite hemorrhage is present. There is some mass
effect with 2-3 mm of midline shift. Partial effacement of the right
lateral ventricle is noted. Left hemisphere is stable. Brainstem and
cerebellum are unremarkable.

Vascular: No hyperdense vessel or unexpected calcification.

Skull: Calvarium is intact. No focal lytic or blastic lesions are
present. No significant extracranial soft tissue lesion is present.

Sinuses/Orbits: A polyp or mucous retention cyst in the right
maxillary sinus is stable. The paranasal sinuses and mastoid air
cells are otherwise clear. The globes and orbits are within normal
limits.
IMPRESSION: 1. Progressive nonhemorrhagic cortical infarct in the posterior
right MCA territory with further loss of gray-white differentiation.
2. Progressive edema and mass effect with 2-3 mm of midline shift.
3. Hyperdense areas in the sulci likely reflect pseudo subarachnoid
hemorrhage with areas of cortex adjacent to one another. No definite
hemorrhage is present.
4. Stable remote infarct of the anterior right MCA territory.

## 2021-02-03 IMAGING — MR MR HEAD W/O CM
4 of 10 series · 21 of 48 positions shown · non-contrast
Comparison: Head CT [DATE] and MRI [DATE]

CLINICAL DATA: Stroke follow-up. Status post revascularization of
right M1 occlusion.

EXAM:
MRI HEAD WITHOUT CONTRAST
TECHNIQUE: Multiplanar, multiecho pulse sequences of the brain and surrounding
structures were obtained without intravenous contrast.

[Series 2: DWI · axial · 3.0mm · 0.94mm/px · z∈[-105,+47]mm · 11 of 104 slices shown (1 of 2)]
[im 1/104]
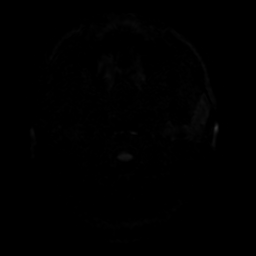
[im 11/104]
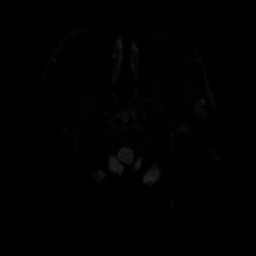
[im 21/104]
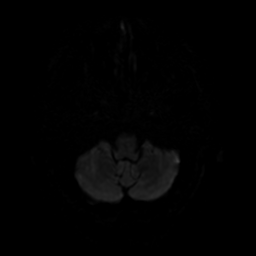
[im 31/104]
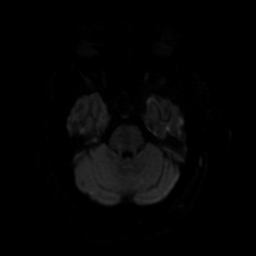
[im 42/104]
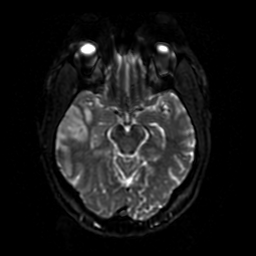
[im 52/104]
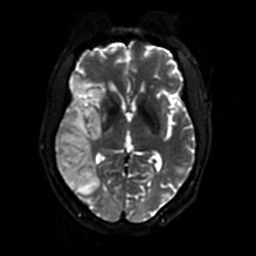
[im 62/104]
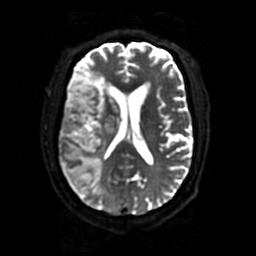
[im 73/104]
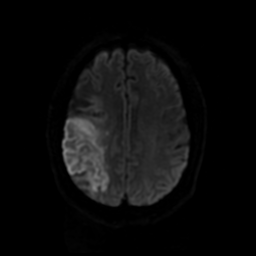
[im 83/104]
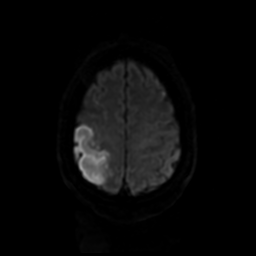
[im 93/104]
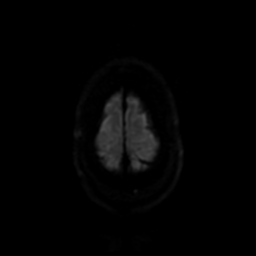
[im 104/104]
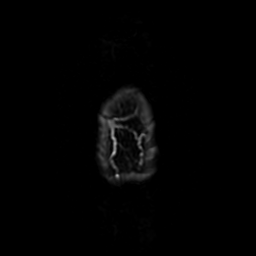

[Series 3: DWI · coronal · 4.0mm · 0.94mm/px · 4 of 76 slices shown (2 of 2)]
[im 1/76]
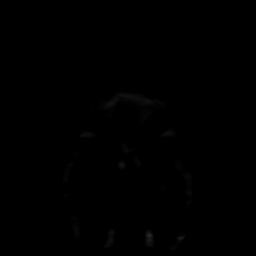
[im 11/76]
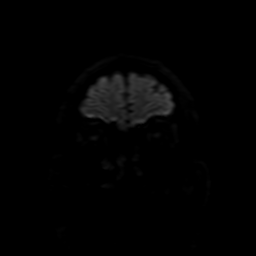
[im 43/76]
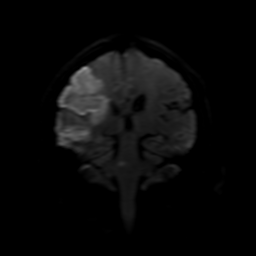
[im 65/76]
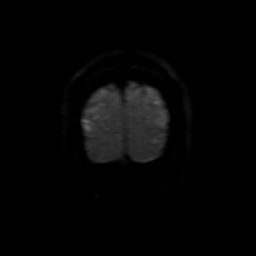

[Series 4: FLAIR · sagittal · 5.0mm · 0.23mm/px · 3 of 25 slices shown (1 of 2)]
[im 1/25]
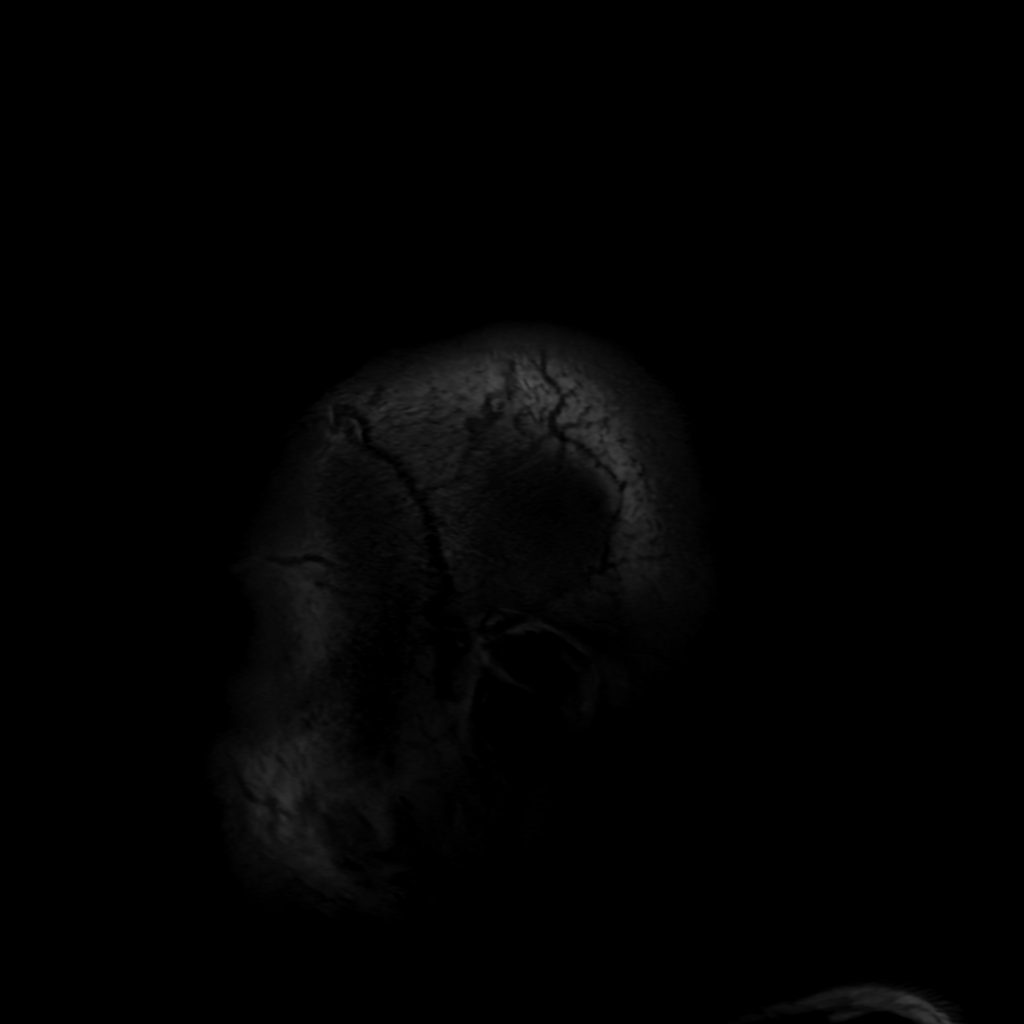
[im 13/25]
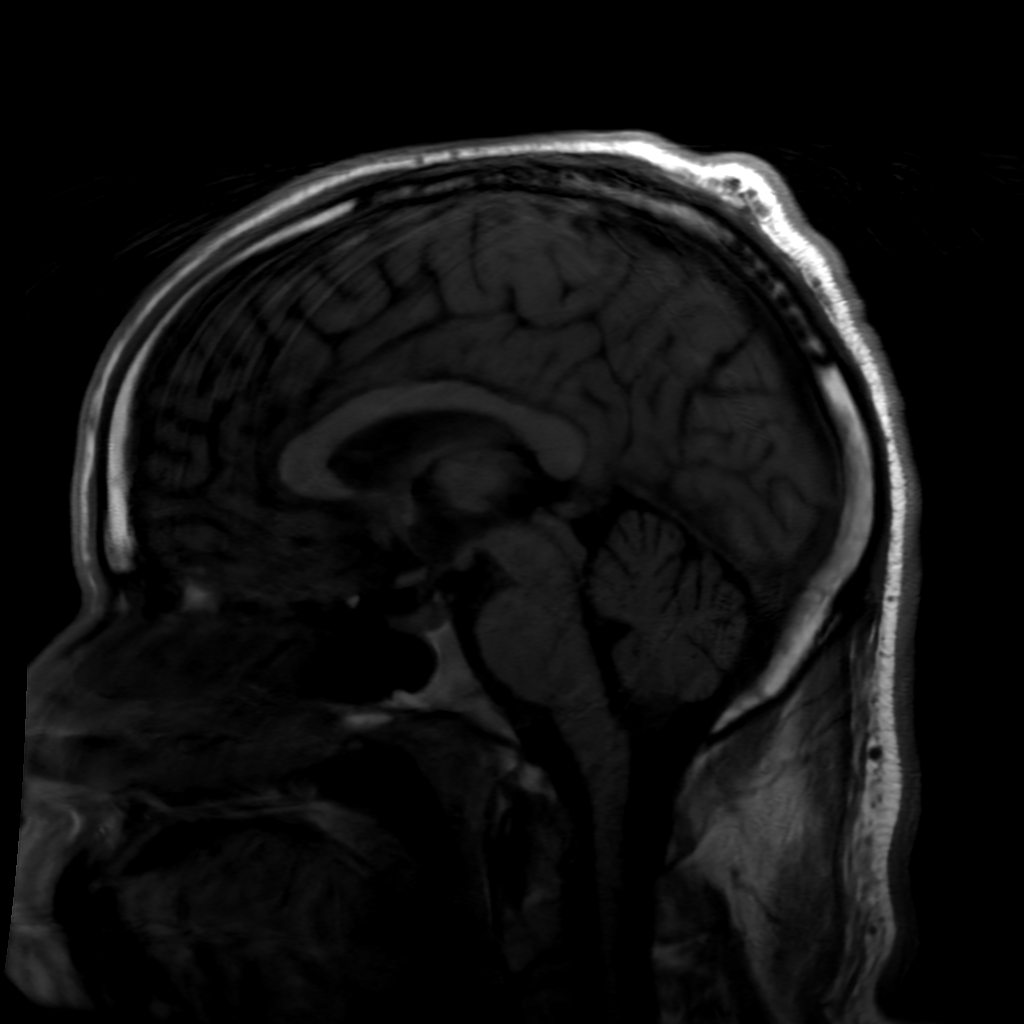
[im 25/25]
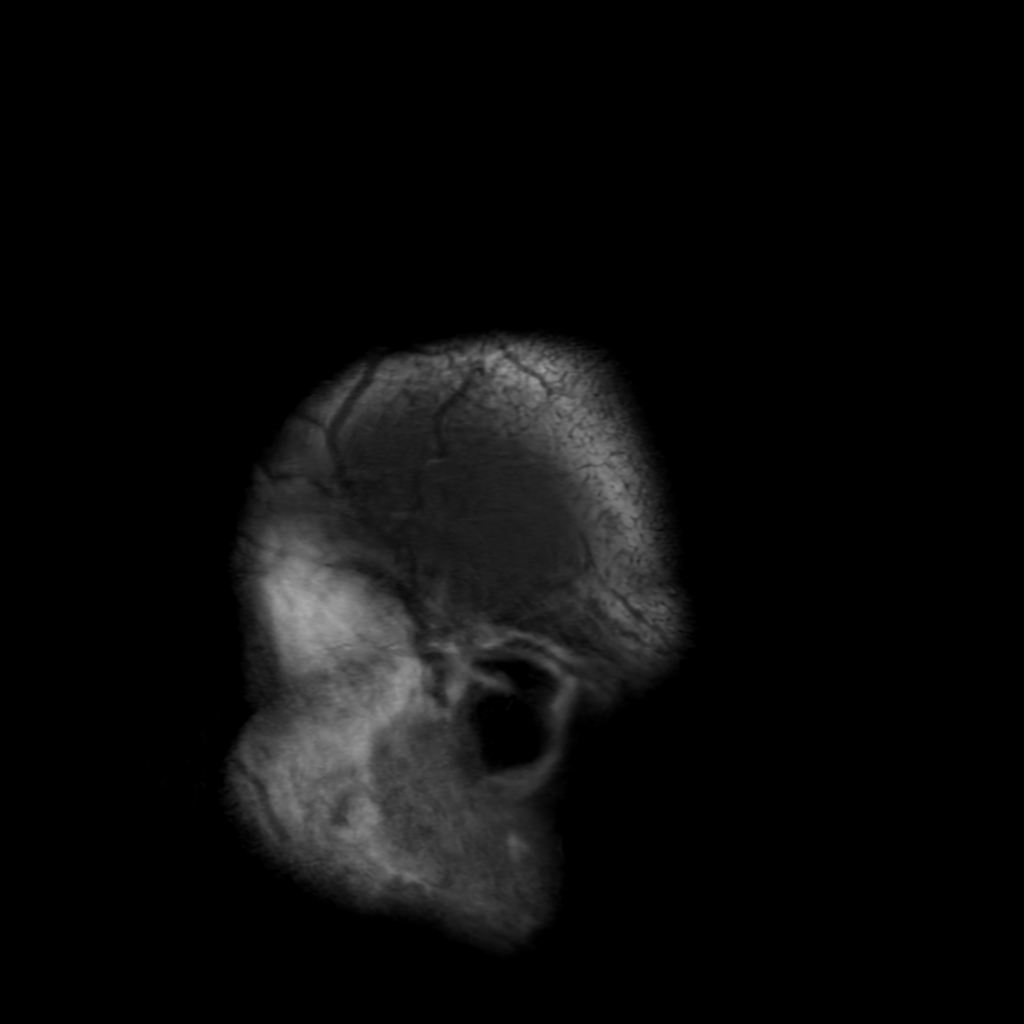

[Series 6: FLAIR · axial · 3.0mm · 0.45mm/px · z∈[-103,+46]mm · 3 of 26 slices shown (2 of 2)]
[im 1/26]
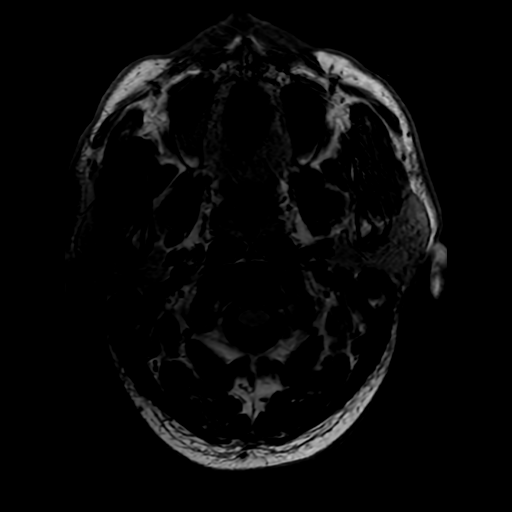
[im 13/26]
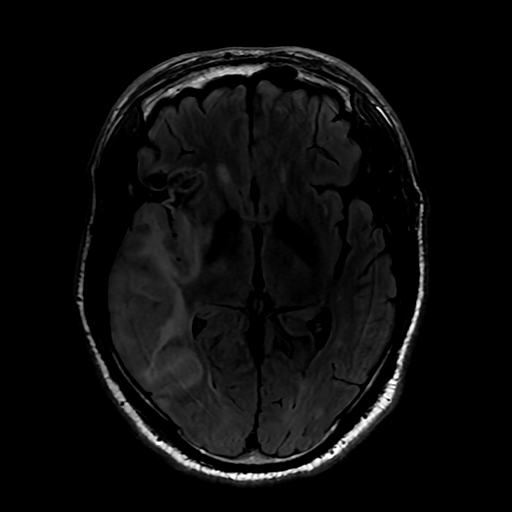
[im 26/26]
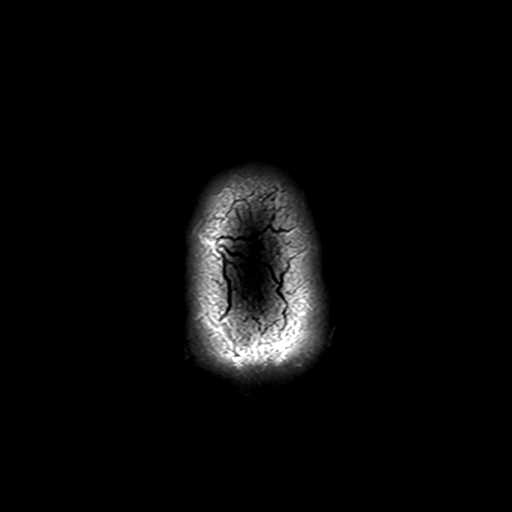

[21 of 48 positions shown; findings below may reference images not displayed]

FINDINGS: Brain: A large acute right MCA territory infarct is again noted
involving portions of the frontal, parietal, temporal, and
superolateral occipital lobes as well as basal ganglia and insula,
larger than that on the prior MRI. There is associated cytotoxic
edema with sulcal effacement, partial effacement of the right
lateral ventricle, and trace leftward midline shift, unchanged from
today's earlier CT. There is associated petechial hemorrhage. A
chronic infarct is noted more anteriorly in the right frontal lobe
and insula with chronic blood products. An 8 mm acute left
cerebellar infarct is new from the prior MRI. There are also new
punctate acute to early subacute infarcts scattered in the left
occipital, left parietal, and left frontal lobes. Small subacute
infarcts are again seen in the pons. There also chronic bilateral
cerebellar infarcts. Scattered small foci of T2 hyperintensity in
the cerebral white matter bilaterally are unchanged from the prior
MRI and are nonspecific but compatible with mild chronic small
vessel ischemic disease. There is no age advanced cerebral atrophy.
There is no extra-axial fluid collection.

Vascular: Major intracranial vascular flow voids are grossly
preserved with a hypoplastic vertebrobasilar circulation.

Skull and upper cervical spine: Unremarkable bone marrow signal.

Sinuses/Orbits: Unremarkable orbits. Mild mucosal thickening and
small mucous retention cysts in the right maxillary sinus. Clear
mastoid air cells.

Other: None.
IMPRESSION: 1. Large acute right MCA territory infarct with petechial hemorrhage
and minimal leftward midline shift, unchanged from today's CT.
2. Small acute left cerebellar infarct.
3. New small acute to early subacute infarcts in the left occipital,
left parietal, and left frontal lobes.

## 2021-02-03 MED ORDER — LISINOPRIL 10 MG PO TABS
10.0000 mg | ORAL_TABLET | Freq: Every day | ORAL | Status: DC
  Administered 2021-02-03 – 2021-02-05 (×3): 10 mg via ORAL
  Filled 2021-02-03 (×3): qty 1

## 2021-02-03 MED ORDER — ACETAMINOPHEN 325 MG PO TABS
650.0000 mg | ORAL_TABLET | ORAL | Status: DC | PRN
  Administered 2021-02-04 – 2021-02-05 (×3): 650 mg via ORAL
  Filled 2021-02-03 (×4): qty 2

## 2021-02-03 MED ORDER — SUGAMMADEX SODIUM 200 MG/2ML IV SOLN
INTRAVENOUS | Status: DC | PRN
  Administered 2021-02-03: 250 mg via INTRAVENOUS

## 2021-02-03 MED ORDER — CHLORHEXIDINE GLUCONATE 0.12 % MT SOLN
15.0000 mL | Freq: Two times a day (BID) | OROMUCOSAL | Status: DC
  Administered 2021-02-03 – 2021-02-12 (×17): 15 mL via OROMUCOSAL
  Filled 2021-02-03 (×12): qty 15

## 2021-02-03 MED ORDER — ACETAMINOPHEN 650 MG RE SUPP: 650.0000 mg | RECTAL | Status: DC | PRN

## 2021-02-03 MED ORDER — ORAL CARE MOUTH RINSE
15.0000 mL | Freq: Two times a day (BID) | OROMUCOSAL | Status: DC
  Administered 2021-02-03 – 2021-02-12 (×16): 15 mL via OROMUCOSAL

## 2021-02-03 MED ORDER — LEVOTHYROXINE SODIUM 25 MCG PO TABS
25.0000 ug | ORAL_TABLET | Freq: Every day | ORAL | Status: DC
  Administered 2021-02-04 – 2021-02-12 (×9): 25 ug via ORAL
  Filled 2021-02-03 (×10): qty 1

## 2021-02-03 MED ORDER — SODIUM CHLORIDE 0.9 % IV SOLN: INTRAVENOUS | Status: DC

## 2021-02-03 MED ORDER — RESOURCE THICKENUP CLEAR PO POWD
ORAL | Status: DC | PRN
  Filled 2021-02-03: qty 125

## 2021-02-03 MED ORDER — HYDRALAZINE HCL 20 MG/ML IJ SOLN
10.0000 mg | INTRAMUSCULAR | Status: DC | PRN
  Administered 2021-02-03 – 2021-02-05 (×4): 10 mg via INTRAVENOUS
  Filled 2021-02-03 (×4): qty 1

## 2021-02-03 MED ORDER — AMLODIPINE BESYLATE 5 MG PO TABS
5.0000 mg | ORAL_TABLET | Freq: Every day | ORAL | Status: DC
  Administered 2021-02-03: 5 mg via ORAL
  Filled 2021-02-03: qty 1

## 2021-02-03 MED ORDER — LABETALOL HCL 5 MG/ML IV SOLN
10.0000 mg | INTRAVENOUS | Status: DC | PRN
  Administered 2021-02-03 – 2021-02-05 (×3): 10 mg via INTRAVENOUS
  Filled 2021-02-03 (×3): qty 4

## 2021-02-03 MED ORDER — CLEVIDIPINE BUTYRATE 0.5 MG/ML IV EMUL
0.0000 mg/h | INTRAVENOUS | Status: AC
  Administered 2021-02-03: 26 mg/h via INTRAVENOUS
  Administered 2021-02-03: 30 mg/h via INTRAVENOUS
  Administered 2021-02-03: 16:00:00 26 mg/h via INTRAVENOUS
  Administered 2021-02-03: 25 mg/h via INTRAVENOUS
  Administered 2021-02-03: 09:00:00 30 mg/h via INTRAVENOUS
  Administered 2021-02-03: 24 mg/h via INTRAVENOUS
  Administered 2021-02-03 (×2): 28 mg/h via INTRAVENOUS
  Administered 2021-02-03 (×2): 22 mg/h via INTRAVENOUS
  Administered 2021-02-03: 26 mg/h via INTRAVENOUS
  Administered 2021-02-04 (×2): 28 mg/h via INTRAVENOUS
  Administered 2021-02-04: 24 mg/h via INTRAVENOUS
  Administered 2021-02-04: 30 mg/h via INTRAVENOUS
  Administered 2021-02-04: 28 mg/h via INTRAVENOUS
  Administered 2021-02-04: 30 mg/h via INTRAVENOUS
  Filled 2021-02-03 (×10): qty 50
  Filled 2021-02-03 (×3): qty 100
  Filled 2021-02-03: qty 150
  Filled 2021-02-03: qty 50

## 2021-02-03 MED ORDER — ACETAMINOPHEN 160 MG/5ML PO SOLN: 650.0000 mg | ORAL | Status: DC | PRN

## 2021-02-03 MED ORDER — PANTOPRAZOLE SODIUM 40 MG PO TBEC
40.0000 mg | DELAYED_RELEASE_TABLET | Freq: Every day | ORAL | Status: DC
  Administered 2021-02-03 – 2021-02-11 (×9): 40 mg via ORAL
  Filled 2021-02-03 (×9): qty 1

## 2021-02-03 MED ORDER — INSULIN ASPART 100 UNIT/ML ~~LOC~~ SOLN: 0.0000 [IU] | Freq: Three times a day (TID) | SUBCUTANEOUS | Status: DC

## 2021-02-03 MED ORDER — LABETALOL HCL 5 MG/ML IV SOLN
INTRAVENOUS | Status: DC | PRN
  Administered 2021-02-02: 5 mg via INTRAVENOUS

## 2021-02-03 MED ORDER — CHLORHEXIDINE GLUCONATE CLOTH 2 % EX PADS
6.0000 | MEDICATED_PAD | Freq: Every day | CUTANEOUS | Status: DC
  Administered 2021-02-03 – 2021-02-07 (×5): 6 via TOPICAL

## 2021-02-03 NOTE — Sedation Documentation (Signed)
Pt extubated

## 2021-02-03 NOTE — Anesthesia Postprocedure Evaluation (Signed)
Anesthesia Post Note  Patient: Matthew Wise  Procedure(s) Performed: IR WITH ANESTHESIA (N/A )     Patient location during evaluation: ICU Anesthesia Type: General Level of consciousness: awake and alert, patient cooperative and oriented Pain management: pain level controlled Vital Signs Assessment: post-procedure vital signs reviewed and stable Respiratory status: spontaneous breathing, nonlabored ventilation, respiratory function stable and patient connected to face mask oxygen Cardiovascular status: stable (on Cleviprex for BP control) Postop Assessment: no apparent nausea or vomiting Anesthetic complications: no   No complications documented.  Last Vitals:  Vitals:   02/02/21 2115 02/03/21 0039  BP:    Pulse: 61 89  Resp: 19 15  Temp:  (!) 36.3 C  SpO2: 100%     Last Pain:  Vitals:   02/03/21 0039  TempSrc: Oral                 Barabas,E. Hayly Litsey

## 2021-02-03 NOTE — Evaluation (Signed)
Speech Language Pathology Evaluation Patient Details Name: Matthew Wise MRN: [id number] DOB: 03/22/1961 Today's Date: 02/03/2021 Time: 1120-1140 SLP Time Calculation (min) (ACUTE ONLY): 20 min  Problem List:  Patient Active Problem List   Diagnosis Date Noted  . Middle cerebral artery embolism, right 02/03/2021  . Status post surgery 02/02/2021  . Stroke (cerebrum) ([place]) 02/02/2021  . Hypoglycemia 01/25/2021  . Thyroid nodule 01/25/2021  . Hypothyroidism 01/25/2021  . Near syncope 01/21/2021  . H/O adenomatous polyp of colon 12/30/2020  . Acute ischemic stroke ([place]) 11/28/2020  . Essential hypertension 11/28/2020  . Mixed hyperlipidemia 11/28/2020  . Acute CVA (cerebrovascular accident) ([place]) 09/09/2020  . Transient ischemic attack 09/07/2020  . History of hypertension 09/07/2020  . Elevated troponin 09/07/2020  . Dizziness 09/07/2020  . Numbness and tingling 09/07/2020  . H/O medication noncompliance 09/07/2020  . Medicare annual wellness visit, subsequent 12/05/2019  . Nicotine dependence 12/05/2019  . CKD (chronic kidney disease) stage 3, GFR 30-59 ml/min (HCC) 10/24/2019  . Prediabetes 10/24/2019  . Pedal edema 09/14/2017  . Tubular adenoma of colon 09/14/2017  . Chronic anticoagulation 11/14/2014  . Sickle cell trait ([place]) 08/29/2012  . 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 DVT (deep venous thrombosis) ([place]) 06/15/2011  . Pulmonary embolism ([place]) 06/15/2011  . 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   Past Medical History:  Past Medical History:  Diagnosis Date  . Acute CVA (cerebrovascular accident) ([place]) 09/09/2020  . Chronic anticoagulation 11/14/2014  . CKD (chronic kidney disease) stage 3, GFR 30-59 ml/min (HCC) 10/24/2019  . CKD (chronic kidney disease) stage 3, GFR 30-59 ml/min (HCC) 10/24/2019  . Clotting disorder ([place])   . 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 Coumadin  failure 10/13/2014   Likely secondary to poor compliance  . DVT (deep venous thrombosis) ([place]) 06/15/2011  . DVT of leg (deep venous thrombosis) (HCC)    rt. leg  . Essential hypertension 11/28/2020  . H/O PE (pulmonary embolism) 06/15/2011  . Hyperlipidemia 11/28/2020  . 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 Prediabetes 10/24/2019  . Thyroid nodule 01/25/2021   Past Surgical History:  Past Surgical History:  Procedure Laterality Date  . COLONOSCOPY WITH PROPOFOL N/A 10/29/2015   incomplete due to redundant colon. two simple tubular adenomas removed. patient was advised to go to [facility] for colonoscopy but he did not keep appt.  Marland Kitchen RADIOLOGY WITH ANESTHESIA N/A 02/02/2021   Procedure: IR WITH ANESTHESIA;  Surgeon: Luanne Bras, MD;  Location: [place];  Service: Radiology;  Laterality: N/A;  . TEE WITHOUT CARDIOVERSION N/A 09/10/2020   Procedure: TRANSESOPHAGEAL ECHOCARDIOGRAM (TEE) WITH PROPOFOL;  Surgeon: Satira Sark, MD;  Location: [facility];  Service: Cardiovascular;  Laterality: N/A;   HPI:  Matthew Wise is a 60 y.o. 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 right posterior temporal and parietal lobe infarct consistent with an inferior division right MCA infarction.  Underwent RT common carotid arteriogram followed by revascularization of occluded Rt MCA   Assessment / Plan / Recommendation Clinical Impression  Pt demonstrates adequate verbal ability, but has cognitive impairment impacting function skills and awareness of deficits. Pt is inattentive to left though he can direct gaze leftward as needed. He denies visuospatial impairment. When completing a clock drawing he repeated the number 10 for the three numbers he drew on  the clock but could not recognize the error. His affect is flat and though he completes all tasks he makes few comments of  requests. Will f/u with further interventions to target awareness of errors and impairment for improved safety.    SLP Assessment  SLP Recommendation/Assessment: Patient needs continued Speech Lanaguage Pathology Services SLP Visit Diagnosis: Dysphagia, oropharyngeal phase (R13.12)    Follow Up Recommendations  Inpatient Rehab    Frequency and Duration min 2x/week  2 weeks      SLP Evaluation Cognition  Overall Cognitive Status: Impaired/Different from baseline Arousal/Alertness: Awake/alert Orientation Level: Oriented to person;Oriented to place;Oriented to time;Oriented to situation Attention: Focused;Sustained;Selective;Alternating Focused Attention: Appears intact Sustained Attention: Appears intact Selective Attention: Appears intact Selective Attention Impairment: Verbal basic Alternating Attention: Appears intact Memory: Impaired Memory Impairment: Decreased short term memory;Decreased recall of new information Decreased Short Term Memory: Verbal basic Awareness: Impaired Awareness Impairment: Emergent impairment Problem Solving: Impaired Problem Solving Impairment: Functional basic Executive Function: Self Monitoring;Self Correcting Self Monitoring: Impaired Self Monitoring Impairment: Verbal basic;Functional basic Safety/Judgment: Impaired       Comprehension  Auditory Comprehension Overall Auditory Comprehension: Appears within functional limits for tasks assessed    Expression Verbal Expression Overall Verbal Expression: Appears within functional limits for tasks assessed Written Expression Dominant Hand: Right   Oral / Motor  Oral Motor/Sensory Function Overall Oral Motor/Sensory Function: Mild impairment Facial ROM: Reduced left Facial Symmetry: Abnormal symmetry left Facial Strength: Reduced left Facial Sensation: Reduced left Lingual ROM: Within Functional Limits Lingual Symmetry: Within Functional Limits Lingual Strength: Within Functional  Limits Lingual Sensation: Within Functional Limits Velum: Within Functional Limits Motor Speech Overall Motor Speech: Appears within functional limits for tasks assessed   GO                    Vidyuth Belsito, Katherene Ponto 02/03/2021, 1:27 PM

## 2021-02-03 NOTE — Transfer of Care (Signed)
Immediate Anesthesia Transfer of Care Note  Patient: Matthew Wise  Procedure(s) Performed: IR WITH ANESTHESIA (N/A )  Patient Location: ICU  Anesthesia Type:General  Level of Consciousness: awake, alert  and oriented to person, place and time.  Airway & Oxygen Therapy: Patient Spontanous Breathing and Patient connected to face mask oxygen  Post-op Assessment: Report given to RN and Post -op Vital signs reviewed and stable.  Minimal movement of left side. Fully follows commands on right side.  Post vital signs: Reviewed and stable.  Last Vitals:  Vitals Value Taken Time  BP    Temp 36.3 C 02/03/21 0039  Pulse 89 02/03/21 0040  Resp 16 02/03/21 0041  SpO2 81 % 02/03/21 0040  Vitals shown include unvalidated device data.  Last Pain:  Vitals:   02/03/21 0039  TempSrc: Oral         Complications: No complications documented.

## 2021-02-03 NOTE — Progress Notes (Signed)
SLP Cancellation Note  Patient Details Name: Matthew Wise MRN: [id number] DOB: Apr 05, 1961   Cancelled treatment:       Reason Eval/Treat Not Completed: Patient not medically ready. Pt laying flat, still lethargic, no [place] yet.  RN will complete Yale when pt is alert and upright and order SLP for swallow if needed.  Will f/u when appropriate.    Angy Swearengin, Katherene Ponto 02/03/2021, 9:25 AM

## 2021-02-03 NOTE — Progress Notes (Signed)
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 He is able to answer all questions appropriately but is unable to move his left side. He has a left facial droop.   Allergies: Patient has no known allergies.  Medications: Prior to Admission medications   Medication Sig Start Date End Date Taking? Authorizing Provider  amLODipine (NORVASC) 5 MG tablet TAKE 1 TABLET (5 MG TOTAL) BY MOUTH DAILY. 01/07/21  Yes Gosrani, Nimish C, MD  apixaban (ELIQUIS) 5 MG TABS tablet Take 1 tablet (5 mg total) by mouth 2 (two) times daily. 01/08/21  Yes Gosrani, Nimish C, MD  atorvastatin (LIPITOR) 40 MG tablet TAKE 1 TABLET BY MOUTH EVERY DAY Patient taking differently: Take 40 mg by mouth daily. 12/23/20  Yes Gosrani, Nimish C, MD  fluticasone (FLONASE) 50 MCG/ACT nasal spray Place 1 spray into both nostrils daily as needed for allergies or rhinitis.   Yes [provider]  levothyroxine (SYNTHROID) 25 MCG tablet TAKE 1 TABLET BY MOUTH EVERY DAY Patient taking differently: Take 25 mcg by mouth daily before breakfast. 01/07/21  Yes Gosrani, Nimish C, MD  Multiple Vitamins-Minerals (CENTRUM ADULTS PO) Take 1 tablet by mouth daily.   Yes [provider]     Vital Signs: BP 133/78 Comment: going by cuff pressures  Pulse 70   Temp 98.8 F (37.1 C) (Oral)   Resp 14   Wt 257 lb 4.4 oz (116.7 kg)   SpO2 96%   BMI 33.94 kg/m   Physical Exam Constitutional:      General: He is not in acute distress. HENT:     Mouth/Throat:     Mouth: Mucous membranes are dry.     Pharynx: Oropharynx is clear.  Cardiovascular:     Comments: Right groin site soft and non-tender. Dressing has old drainage which has been outlined.  Pulmonary:     Effort: Pulmonary effort is normal.  Skin:    General: Skin is warm and  dry.  Neurological:     Mental Status: He is alert and oriented to person, place, and time.     Cranial Nerves: Facial asymmetry present.     Comments: Left-side paralysis; left facial droop     Imaging: CT HEAD WO CONTRAST  Result Date: 02/03/2021 CLINICAL DATA:  Status post revascularization of right M1 occlusion with right MCA infarct and ischemia. EXAM: CT HEAD WITHOUT CONTRAST TECHNIQUE: Contiguous axial images were obtained from the base of the skull through the vertex without intravenous contrast. COMPARISON:  CT head without contrast 02/02/2021. CT perfusion and CTA head and neck 02/02/2021. MR head without contrast 01/26/2021. FINDINGS: Brain: Progressive cortical infarct is present in the posterior right MCA territory with further loss of gray-white differentiation. Remote infarct in the anterior right MCA territory is stable. Progressive edema and mass effect is present with effacement the sulci on the right. Hyperdense areas in the sulci likely reflect pseudo subarachnoid hemorrhage with areas of cortex adjacent to one another. No definite hemorrhage is present. There is some mass effect with 2-3 mm of midline shift. Partial effacement of the right lateral ventricle is noted. Left hemisphere is stable. Brainstem and cerebellum are unremarkable. Vascular: No hyperdense vessel or unexpected calcification. Skull: Calvarium is intact. No focal lytic or blastic lesions are present. No significant extracranial soft tissue lesion is present. Sinuses/Orbits: A polyp  or mucous retention cyst in the right maxillary sinus is stable. The paranasal sinuses and mastoid air cells are otherwise clear. The globes and orbits are within normal limits. IMPRESSION: 1. Progressive nonhemorrhagic cortical infarct in the posterior right MCA territory with further loss of gray-white differentiation. 2. Progressive edema and mass effect with 2-3 mm of midline shift. 3. Hyperdense areas in the sulci likely reflect pseudo  subarachnoid hemorrhage with areas of cortex adjacent to one another. No definite hemorrhage is present. 4. Stable remote infarct of the anterior right MCA territory. Electronically Signed   By: San Morelle M.D.   On: 02/03/2021 09:22   CT CEREBRAL PERFUSION W CONTRAST  Result Date: 02/02/2021 CLINICAL DATA:  Initial evaluation for acute stroke. EXAM: CT ANGIOGRAPHY HEAD AND NECK CT PERFUSION BRAIN TECHNIQUE: Multidetector CT imaging of the head and neck was performed using the standard protocol during bolus administration of intravenous contrast. Multiplanar CT image reconstructions and MIPs were obtained to evaluate the vascular anatomy. Carotid stenosis measurements (when applicable) are obtained utilizing NASCET criteria, using the distal internal carotid diameter as the denominator. Multiphase CT imaging of the brain was performed following IV bolus contrast injection. Subsequent parametric perfusion maps were calculated using RAPID software. CONTRAST:  138mL OMNIPAQUE IOHEXOL 350 MG/ML SOLN COMPARISON:  Prior CT from earlier the same day as well as recent MRI from 01/26/2021 and CTA from 01/25/2021. FINDINGS: CTA NECK FINDINGS Aortic arch: Visualized aortic arch normal in caliber with normal 3 vessel morphology. No hemodynamically significant stenosis about the origin of the great vessels. Right carotid system: Right common and internal carotid arteries widely patent without stenosis, dissection or occlusion. Mild atheromatous change about the origin of the right ICA without significant stenosis. Left carotid system: Left common and internal carotid arteries widely patent without stenosis, dissection or occlusion. Mild atheromatous change about the proximal left ICA without significant stenosis. Vertebral arteries: Vertebral arteries are diminutive and poorly visualized within the neck, but are grossly patent without stenosis or other abnormality. Skeleton: No visible acute osseous finding. No  discrete or worrisome osseous lesions. Other neck: No other acute soft tissue abnormality within the neck. 1.7 cm sebaceous cyst noted at the right submandibular region. Bilateral thyroid nodules noted, recently evaluated on dedicated thyroid ultrasound. No other mass or adenopathy. Upper chest: Visualized upper chest demonstrates no acute finding. Engorgement of the azygos vein noted. Review of the MIP images confirms the above findings CTA HEAD FINDINGS Anterior circulation: Evaluation technically limited by timing the contrast bolus with poor opacification of the intracranial arterial circulation. Both internal carotid arteries remain patent to the termini without stenosis. A1 segments patent. Grossly normal anterior communicating artery complex. Anterior cerebral arteries remain grossly patent to their distal aspects. Left M1 patent. Grossly normal left MCA bifurcation. No visible proximal left M2 branch occlusion. Distal left MCA branches perfused. There now appears to have developed in occlusion of the right M1 segment (series 4, image 119). There was suggestion of an underlying stenosis at this level on prior CTA. Minimal scant flow seen distally, which could be collateral in nature. An adjacent right temporal branch appears to remain patent. Posterior circulation: Both V4 segments are grossly patent to the vertebrobasilar junction without appreciable stenosis. Left PICA grossly patent. Right PICA not seen. Basilar markedly diminutive but grossly patent to its distal aspect. Superior cerebellar arteries appear patent proximally. Fetal type origin of the PCAs supplied via a robust bilateral posterior communicating arteries. Both PCAs remain grossly perfused to their distal aspects.  Venous sinuses: Patent. Anatomic variants: Fetal type origin of the PCAs. No visible aneurysm. Review of the MIP images confirms the above findings CT Brain Perfusion Findings: ASPECTS: Estimated at approximately 6 on prior CT. CBF  (<30%) Volume: 59mL Perfusion (Tmax>6.0s) volume: 127mL Mismatch Volume: 53mL Infarction Location:Evidence for acute core infarct at the posterior right frontoparietal region, largely corresponding with the completed infarct as seen on prior MRI. This may have expanded partially in the interim. Evidence for associated subtle leptomeningeal enhancement on corresponding CTA, consistent with the subacute nature of this infarct. Surrounding ischemic penumbra of 57 cc, consider tissue at risk. IMPRESSION: 1. Abrupt occlusion of the right M1 segment, new as compared to prior CTA from 01/25/2021. There is suggestion of an underlying stenosis at this level on prior CTA from 01/25/2021. 2. Acute core infarct at the posterior right frontoparietal region, largely corresponding with the completed infarct as seen on prior MRI. This may have partially expanded in the interim. Surrounding ischemic penumbra of 57 cc, considered tissue at risk. 3. Fetal type origin of the PCAs with overall diminutive vertebrobasilar system. 4. Otherwise gross patency of the major arterial vasculature of the head and neck, although evaluation somewhat technically limited by timing of the contrast bolus. Critical Value/emergent results were discussed by telephone at the time of interpretation on 02/02/2021 at 8:36 p.m. to provider [facility] , who verbally acknowledged these results. Electronically Signed   By: Jeannine Boga M.D.   On: 02/02/2021 21:35   CT HEAD CODE STROKE WO CONTRAST  Result Date: 02/02/2021 CLINICAL DATA:  Code stroke.  Left-sided weakness EXAM: CT HEAD WITHOUT CONTRAST TECHNIQUE: Contiguous axial images were obtained from the base of the skull through the vertex without intravenous contrast. COMPARISON:  01/25/2021 FINDINGS: Brain: No acute intracranial hemorrhage. Suspect new loss of gray-white differentiation in the right frontoparietal lobes posterior to the chronic frontal infarct with involvement of lateral  precentral and postcentral gyri. Evolution of recent prior infarction involving the right parietal and temporal lobes. Small chronic left cerebellar infarcts are again seen. Stable findings of probable chronic microvascular ischemic changes. No hydrocephalus or extra-axial collection. Vascular: No hyperdense vessel. Skull: Unremarkable. Sinuses/Orbits: No acute abnormality. Other: None. IMPRESSION: No acute intracranial hemorrhage. Suspect acute infarction of the right frontoparietal lobes posterosuperior to chronic infarct with probable involvement of lateral precentral and postcentral gyri. These results were called by telephone at the time of interpretation on 02/02/2021 at 7:57 pm to provider Fredia Sorrow , who verbally acknowledged these results. Electronically Signed   By: Macy Mis M.D.   On: 02/02/2021 20:00   CT ANGIO HEAD CODE STROKE  Result Date: 02/02/2021 CLINICAL DATA:  Initial evaluation for acute stroke. EXAM: CT ANGIOGRAPHY HEAD AND NECK CT PERFUSION BRAIN TECHNIQUE: Multidetector CT imaging of the head and neck was performed using the standard protocol during bolus administration of intravenous contrast. Multiplanar CT image reconstructions and MIPs were obtained to evaluate the vascular anatomy. Carotid stenosis measurements (when applicable) are obtained utilizing NASCET criteria, using the distal internal carotid diameter as the denominator. Multiphase CT imaging of the brain was performed following IV bolus contrast injection. Subsequent parametric perfusion maps were calculated using RAPID software. CONTRAST:  169mL OMNIPAQUE IOHEXOL 350 MG/ML SOLN COMPARISON:  Prior CT from earlier the same day as well as recent MRI from 01/26/2021 and CTA from 01/25/2021. FINDINGS: CTA NECK FINDINGS Aortic arch: Visualized aortic arch normal in caliber with normal 3 vessel morphology. No hemodynamically significant stenosis about the origin of the  great vessels. Right carotid system: Right common  and internal carotid arteries widely patent without stenosis, dissection or occlusion. Mild atheromatous change about the origin of the right ICA without significant stenosis. Left carotid system: Left common and internal carotid arteries widely patent without stenosis, dissection or occlusion. Mild atheromatous change about the proximal left ICA without significant stenosis. Vertebral arteries: Vertebral arteries are diminutive and poorly visualized within the neck, but are grossly patent without stenosis or other abnormality. Skeleton: No visible acute osseous finding. No discrete or worrisome osseous lesions. Other neck: No other acute soft tissue abnormality within the neck. 1.7 cm sebaceous cyst noted at the right submandibular region. Bilateral thyroid nodules noted, recently evaluated on dedicated thyroid ultrasound. No other mass or adenopathy. Upper chest: Visualized upper chest demonstrates no acute finding. Engorgement of the azygos vein noted. Review of the MIP images confirms the above findings CTA HEAD FINDINGS Anterior circulation: Evaluation technically limited by timing the contrast bolus with poor opacification of the intracranial arterial circulation. Both internal carotid arteries remain patent to the termini without stenosis. A1 segments patent. Grossly normal anterior communicating artery complex. Anterior cerebral arteries remain grossly patent to their distal aspects. Left M1 patent. Grossly normal left MCA bifurcation. No visible proximal left M2 branch occlusion. Distal left MCA branches perfused. There now appears to have developed in occlusion of the right M1 segment (series 4, image 119). There was suggestion of an underlying stenosis at this level on prior CTA. Minimal scant flow seen distally, which could be collateral in nature. An adjacent right temporal branch appears to remain patent. Posterior circulation: Both V4 segments are grossly patent to the vertebrobasilar junction without  appreciable stenosis. Left PICA grossly patent. Right PICA not seen. Basilar markedly diminutive but grossly patent to its distal aspect. Superior cerebellar arteries appear patent proximally. Fetal type origin of the PCAs supplied via a robust bilateral posterior communicating arteries. Both PCAs remain grossly perfused to their distal aspects. Venous sinuses: Patent. Anatomic variants: Fetal type origin of the PCAs. No visible aneurysm. Review of the MIP images confirms the above findings CT Brain Perfusion Findings: ASPECTS: Estimated at approximately 6 on prior CT. CBF (<30%) Volume: 36mL Perfusion (Tmax>6.0s) volume: 179mL Mismatch Volume: 26mL Infarction Location:Evidence for acute core infarct at the posterior right frontoparietal region, largely corresponding with the completed infarct as seen on prior MRI. This may have expanded partially in the interim. Evidence for associated subtle leptomeningeal enhancement on corresponding CTA, consistent with the subacute nature of this infarct. Surrounding ischemic penumbra of 57 cc, consider tissue at risk. IMPRESSION: 1. Abrupt occlusion of the right M1 segment, new as compared to prior CTA from 01/25/2021. There is suggestion of an underlying stenosis at this level on prior CTA from 01/25/2021. 2. Acute core infarct at the posterior right frontoparietal region, largely corresponding with the completed infarct as seen on prior MRI. This may have partially expanded in the interim. Surrounding ischemic penumbra of 57 cc, considered tissue at risk. 3. Fetal type origin of the PCAs with overall diminutive vertebrobasilar system. 4. Otherwise gross patency of the major arterial vasculature of the head and neck, although evaluation somewhat technically limited by timing of the contrast bolus. Critical Value/emergent results were discussed by telephone at the time of interpretation on 02/02/2021 at 8:36 p.m. to provider [facility] , who verbally acknowledged these  results. Electronically Signed   By: Jeannine Boga M.D.   On: 02/02/2021 21:35   CT ANGIO NECK CODE STROKE  Result Date:  02/02/2021 CLINICAL DATA:  Initial evaluation for acute stroke. EXAM: CT ANGIOGRAPHY HEAD AND NECK CT PERFUSION BRAIN TECHNIQUE: Multidetector CT imaging of the head and neck was performed using the standard protocol during bolus administration of intravenous contrast. Multiplanar CT image reconstructions and MIPs were obtained to evaluate the vascular anatomy. Carotid stenosis measurements (when applicable) are obtained utilizing NASCET criteria, using the distal internal carotid diameter as the denominator. Multiphase CT imaging of the brain was performed following IV bolus contrast injection. Subsequent parametric perfusion maps were calculated using RAPID software. CONTRAST:  125mL OMNIPAQUE IOHEXOL 350 MG/ML SOLN COMPARISON:  Prior CT from earlier the same day as well as recent MRI from 01/26/2021 and CTA from 01/25/2021. FINDINGS: CTA NECK FINDINGS Aortic arch: Visualized aortic arch normal in caliber with normal 3 vessel morphology. No hemodynamically significant stenosis about the origin of the great vessels. Right carotid system: Right common and internal carotid arteries widely patent without stenosis, dissection or occlusion. Mild atheromatous change about the origin of the right ICA without significant stenosis. Left carotid system: Left common and internal carotid arteries widely patent without stenosis, dissection or occlusion. Mild atheromatous change about the proximal left ICA without significant stenosis. Vertebral arteries: Vertebral arteries are diminutive and poorly visualized within the neck, but are grossly patent without stenosis or other abnormality. Skeleton: No visible acute osseous finding. No discrete or worrisome osseous lesions. Other neck: No other acute soft tissue abnormality within the neck. 1.7 cm sebaceous cyst noted at the right submandibular region.  Bilateral thyroid nodules noted, recently evaluated on dedicated thyroid ultrasound. No other mass or adenopathy. Upper chest: Visualized upper chest demonstrates no acute finding. Engorgement of the azygos vein noted. Review of the MIP images confirms the above findings CTA HEAD FINDINGS Anterior circulation: Evaluation technically limited by timing the contrast bolus with poor opacification of the intracranial arterial circulation. Both internal carotid arteries remain patent to the termini without stenosis. A1 segments patent. Grossly normal anterior communicating artery complex. Anterior cerebral arteries remain grossly patent to their distal aspects. Left M1 patent. Grossly normal left MCA bifurcation. No visible proximal left M2 branch occlusion. Distal left MCA branches perfused. There now appears to have developed in occlusion of the right M1 segment (series 4, image 119). There was suggestion of an underlying stenosis at this level on prior CTA. Minimal scant flow seen distally, which could be collateral in nature. An adjacent right temporal branch appears to remain patent. Posterior circulation: Both V4 segments are grossly patent to the vertebrobasilar junction without appreciable stenosis. Left PICA grossly patent. Right PICA not seen. Basilar markedly diminutive but grossly patent to its distal aspect. Superior cerebellar arteries appear patent proximally. Fetal type origin of the PCAs supplied via a robust bilateral posterior communicating arteries. Both PCAs remain grossly perfused to their distal aspects. Venous sinuses: Patent. Anatomic variants: Fetal type origin of the PCAs. No visible aneurysm. Review of the MIP images confirms the above findings CT Brain Perfusion Findings: ASPECTS: Estimated at approximately 6 on prior CT. CBF (<30%) Volume: 23mL Perfusion (Tmax>6.0s) volume: 139mL Mismatch Volume: 74mL Infarction Location:Evidence for acute core infarct at the posterior right frontoparietal  region, largely corresponding with the completed infarct as seen on prior MRI. This may have expanded partially in the interim. Evidence for associated subtle leptomeningeal enhancement on corresponding CTA, consistent with the subacute nature of this infarct. Surrounding ischemic penumbra of 57 cc, consider tissue at risk. IMPRESSION: 1. Abrupt occlusion of the right M1 segment, new as compared to  prior CTA from 01/25/2021. There is suggestion of an underlying stenosis at this level on prior CTA from 01/25/2021. 2. Acute core infarct at the posterior right frontoparietal region, largely corresponding with the completed infarct as seen on prior MRI. This may have partially expanded in the interim. Surrounding ischemic penumbra of 57 cc, considered tissue at risk. 3. Fetal type origin of the PCAs with overall diminutive vertebrobasilar system. 4. Otherwise gross patency of the major arterial vasculature of the head and neck, although evaluation somewhat technically limited by timing of the contrast bolus. Critical Value/emergent results were discussed by telephone at the time of interpretation on 02/02/2021 at 8:36 p.m. to provider [facility] , who verbally acknowledged these results. Electronically Signed   By: Jeannine Boga M.D.   On: 02/02/2021 21:35    Labs:  CBC: Recent Labs    01/26/21 0242 01/27/21 0202 02/02/21 1951 02/03/21 0222  WBC 5.0 5.4 6.2 6.2  HGB 13.6 13.1 13.3 13.5  HCT 40.2 38.0* 39.8 39.7  PLT 198 198 188 188    COAGS: Recent Labs    11/29/20 0654 01/25/21 1916 01/26/21 0242 02/02/21 1951  INR 1.4* 1.1 1.1 1.1  APTT 34 32 31 31    BMP: Recent Labs    12/19/20 0915 01/21/21 1758 01/26/21 0242 01/27/21 0202 02/02/21 1951 02/03/21 0222  NA 141   < > 139 139 140 137  K 3.6   < > 3.6 3.3* 3.9 3.7  CL 106   < > 101 102 105 106  CO2 27   < > 27 28 26  21*  GLUCOSE 113*   < > 107* 122* 123* 146*  BUN 14   < > 8 12 16 14   CALCIUM 9.7   < > 9.5 9.6 9.3  9.1  CREATININE 2.01*   < > 1.48* 1.51* 1.83* 1.72*  GFRNONAA 36*   < > 55* 53* 42* 46*  GFRAA 41*  --   --   --   --   --    < > = values in this interval not displayed.    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 Cleviprex still infusing for blood pressure control. He has been cleared for a dysphagia diet. Imaging was obtained this morning:   CT Head 02/03/21: IMPRESSION: 1. Progressive nonhemorrhagic cortical infarct in the posterior right MCA territory with further loss of gray-white differentiation. 2. Progressive edema and mass effect with 2-3 mm of midline shift. 3. Hyperdense areas in the sulci likely reflect pseudo subarachnoid hemorrhage with areas of cortex adjacent to one another. No definite hemorrhage is present. 4. Stable remote infarct of the anterior right MCA territory.  Ok to remove right femoral dressing and arterial line tomorrow morning.   Other plans per Neurology. Please call NIR with any questions.   Electronically Signed: Soyla Dryer, AGACNP-BC [phone] 02/03/2021, 3:24 PM   I spent a total of 15 Minutes at the the patient's bedside AND on the patient's hospital floor or unit, greater than 50% of which was counseling/coordinating care for right MCA occlusion s/p thrombectomy.

## 2021-02-03 NOTE — Evaluation (Signed)
Clinical/Bedside Swallow Evaluation Patient Details  Name: Matthew Wise MRN: [id number] Date of Birth: 1961-11-09  Today's Date: 02/03/2021 Time: SLP Start Time (ACUTE ONLY): 62 SLP Stop Time (ACUTE ONLY): 1150 SLP Time Calculation (min) (ACUTE ONLY): 20 min  Past Medical History:  Past Medical History:  Diagnosis Date  . Acute CVA (cerebrovascular accident) ([place]) 09/09/2020  . Chronic anticoagulation 11/14/2014  . CKD (chronic kidney disease) stage 3, GFR 30-59 ml/min ([facility]) 10/24/2019  . CKD (chronic kidney disease) stage 3, GFR 30-59 ml/min (HCC) 10/24/2019  . Clotting disorder ([place])   . 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 DVT (deep venous thrombosis) ([place]) 06/15/2011  . DVT of leg (deep venous thrombosis) ([facility])    rt. leg  . Essential hypertension 11/28/2020  . H/O PE (pulmonary embolism) 06/15/2011  . Hyperlipidemia 11/28/2020  . 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 Prediabetes 10/24/2019  . Thyroid nodule 01/25/2021   Past Surgical History:  Past Surgical History:  Procedure Laterality Date  . COLONOSCOPY WITH PROPOFOL N/A 10/29/2015   incomplete due to redundant colon. two simple tubular adenomas removed. patient was advised to go to [facility] for colonoscopy but he did not keep appt.  Marland Kitchen RADIOLOGY WITH ANESTHESIA N/A 02/02/2021   Procedure: IR WITH ANESTHESIA;  Surgeon: Luanne Bras, MD;  Location: [facility];  Service: Radiology;  Laterality: N/A;  . TEE WITHOUT CARDIOVERSION N/A 09/10/2020   Procedure: TRANSESOPHAGEAL ECHOCARDIOGRAM (TEE) WITH PROPOFOL;  Surgeon: Satira Sark, MD;  Location: [facility];  Service: Cardiovascular;  Laterality: N/A;   HPI:  Matthew Wise is a 60 y.o. 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 right posterior temporal and  parietal lobe infarct consistent with an inferior division right MCA infarction.  Underwent RT common carotid arteriogram followed by revascularization of occluded Rt MCA   Assessment / Plan / Recommendation Clinical Impression  Pt demonstrates no signs of aspiration with thin or nectar thick liquids though he had just failed 30 oz water swallow with RN prior to session. He is noted to have left facial weakness and sensory impairment and has no awareness of labial spillage on the left. Pt recommended to initaite nectar thick liquids and mech soft solids today with good potential for upgrade to thin if POs are tolerated well. SLP Visit Diagnosis: Dysphagia, oropharyngeal phase (R13.12)    Aspiration Risk  Mild aspiration risk    Diet Recommendation Dysphagia 3 (Mech soft);Nectar-thick liquid   Liquid Administration via: Cup;Straw Medication Administration: Whole meds with liquid Supervision: Patient able to self feed    Other  Recommendations Oral Care Recommendations: Oral care BID Other Recommendations: Order thickener from pharmacy   Follow up Recommendations Inpatient Rehab      Frequency and Duration min 2x/week  2 weeks       Prognosis Prognosis for Safe Diet Advancement: Good      Swallow Study   General HPI: Matthew Wise is a 60 y.o. 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 right posterior temporal and parietal lobe infarct consistent with an inferior division right MCA infarction.  Underwent RT common carotid arteriogram followed by revascularization of occluded Rt MCA Type of Study: Bedside Swallow  Evaluation Previous Swallow Assessment: None Diet Prior to this Study: NPO Temperature Spikes Noted: No Respiratory Status: Room air History of Recent Intubation: No Behavior/Cognition: Alert;Cooperative;Pleasant mood Oral Cavity Assessment: Within  Functional Limits Oral Care Completed by SLP: No Oral Cavity - Dentition: Adequate natural dentition Vision: Functional for self-feeding Self-Feeding Abilities: Able to feed self Patient Positioning: Upright in bed Baseline Vocal Quality: Normal Volitional Cough: Strong Volitional Swallow: Able to elicit    Oral/Motor/Sensory Function Overall Oral Motor/Sensory Function: Mild impairment Facial ROM: Reduced left Facial Symmetry: Abnormal symmetry left Facial Strength: Reduced left Facial Sensation: Reduced left Lingual ROM: Within Functional Limits Lingual Symmetry: Within Functional Limits Lingual Strength: Within Functional Limits Lingual Sensation: Within Functional Limits   Ice Chips Ice chips: Not tested   Thin Liquid Thin Liquid: Impaired Presentation: Cup;Self Fed Oral Phase Impairments: Reduced labial seal Oral Phase Functional Implications: Left anterior spillage    Nectar Thick Nectar Thick Liquid: Impaired Presentation: Straw Oral Phase Impairments: Reduced labial seal Oral phase functional implications: Left anterior spillage   Honey Thick Honey Thick Liquid: Not tested   Puree Puree: Within functional limits Presentation: Spoon   Solid     Solid: Impaired Presentation: Self Fed Oral Phase Impairments: Reduced labial seal Oral Phase Functional Implications: Oral residue;Left anterior spillage     Herbie Baltimore, MA CCC-SLP  Acute Rehabilitation Services Pager [phone] Office [phone]  Lynann Beaver 02/03/2021,1:18 PM

## 2021-02-03 NOTE — Progress Notes (Addendum)
OT Cancellation Note  Patient Details Name: Matthew Wise MRN: [id number] DOB: November 15, 1961   Cancelled Treatment:    Reason Eval/Treat Not Completed: Active bedrest order. Will return as schedule allows.   Addendum @1009 : RN request hold as pt with continued bedrest and plan to check for DVT. Will return as schedule allows.    Ila, OTR/L Acute Rehab Pager: [phone] Office: [phone] 02/03/2021, 7:57 AM

## 2021-02-03 NOTE — Progress Notes (Signed)
Patient belongings at bedside include cell phone and purple phone charger.

## 2021-02-03 NOTE — Progress Notes (Addendum)
STROKE TEAM PROGRESS NOTE   INTERVAL HISTORY Seen and examined with RN at bedside. Awake, cooperative.  Vitals:   02/03/21 0945 02/03/21 0950 02/03/21 0955 02/03/21 1100  BP: 118/65  136/64 135/73  Pulse: 70 70 69 77  Resp: 12 12 10 11   Temp:      TempSrc:      SpO2: 100% 100% 100% 100%  Weight:       CBC:  Recent Labs  Lab 02/02/21 1951 02/03/21 0222  WBC 6.2 6.2  NEUTROABS 4.6 4.5  HGB 13.3 13.5  HCT 39.8 39.7  MCV 86.3 84.8  PLT 188 470   Basic Metabolic Panel:  Recent Labs  Lab 02/02/21 1951 02/03/21 0222  NA 140 137  K 3.9 3.7  CL 105 106  CO2 26 21*  GLUCOSE 123* 146*  BUN 16 14  CREATININE 1.83* 1.72*  CALCIUM 9.3 9.1   Lipid Panel:  Recent Labs  Lab 02/03/21 0222  CHOL 109  TRIG 141  HDL 56  CHOLHDL 1.9  VLDL 28  LDLCALC 25   HgbA1c:  Recent Labs  Lab 02/03/21 0222  HGBA1C 5.9*   Urine Drug Screen:  Recent Labs  Lab 02/03/21 0222  LABOPIA NONE DETECTED  COCAINSCRNUR NONE DETECTED  LABBENZ NONE DETECTED  AMPHETMU NONE DETECTED  THCU NONE DETECTED  LABBARB NONE DETECTED    Alcohol Level  Recent Labs  Lab 02/02/21 1951  ETH <10    IMAGING past 24 hours CT HEAD WO CONTRAST  Result Date: 02/03/2021 CLINICAL DATA:  Status post revascularization of right M1 occlusion with right MCA infarct and ischemia. EXAM: CT HEAD WITHOUT CONTRAST TECHNIQUE: Contiguous axial images were obtained from the base of the skull through the vertex without intravenous contrast. COMPARISON:  CT head without contrast 02/02/2021. CT perfusion and CTA head and neck 02/02/2021. MR head without contrast 01/26/2021. FINDINGS: Brain: Progressive cortical infarct is present in the posterior right MCA territory with further loss of gray-white differentiation. Remote infarct in the anterior right MCA territory is stable. Progressive edema and mass effect is present with effacement the sulci on the right. Hyperdense areas in the sulci likely reflect pseudo subarachnoid  hemorrhage with areas of cortex adjacent to one another. No definite hemorrhage is present. There is some mass effect with 2-3 mm of midline shift. Partial effacement of the right lateral ventricle is noted. Left hemisphere is stable. Brainstem and cerebellum are unremarkable. Vascular: No hyperdense vessel or unexpected calcification. Skull: Calvarium is intact. No focal lytic or blastic lesions are present. No significant extracranial soft tissue lesion is present. Sinuses/Orbits: A polyp or mucous retention cyst in the right maxillary sinus is stable. The paranasal sinuses and mastoid air cells are otherwise clear. The globes and orbits are within normal limits. IMPRESSION: 1. Progressive nonhemorrhagic cortical infarct in the posterior right MCA territory with further loss of gray-white differentiation. 2. Progressive edema and mass effect with 2-3 mm of midline shift. 3. Hyperdense areas in the sulci likely reflect pseudo subarachnoid hemorrhage with areas of cortex adjacent to one another. No definite hemorrhage is present. 4. Stable remote infarct of the anterior right MCA territory. Electronically Signed   By: San Morelle M.D.   On: 02/03/2021 09:22   CT CEREBRAL PERFUSION W CONTRAST  Result Date: 02/02/2021 CLINICAL DATA:  Initial evaluation for acute stroke. EXAM: CT ANGIOGRAPHY HEAD AND NECK CT PERFUSION BRAIN TECHNIQUE: Multidetector CT imaging of the head and neck was performed using the standard protocol during bolus administration of  intravenous contrast. Multiplanar CT image reconstructions and MIPs were obtained to evaluate the vascular anatomy. Carotid stenosis measurements (when applicable) are obtained utilizing NASCET criteria, using the distal internal carotid diameter as the denominator. Multiphase CT imaging of the brain was performed following IV bolus contrast injection. Subsequent parametric perfusion maps were calculated using RAPID software. CONTRAST:  160mL OMNIPAQUE IOHEXOL  350 MG/ML SOLN COMPARISON:  Prior CT from earlier the same day as well as recent MRI from 01/26/2021 and CTA from 01/25/2021. FINDINGS: CTA NECK FINDINGS Aortic arch: Visualized aortic arch normal in caliber with normal 3 vessel morphology. No hemodynamically significant stenosis about the origin of the great vessels. Right carotid system: Right common and internal carotid arteries widely patent without stenosis, dissection or occlusion. Mild atheromatous change about the origin of the right ICA without significant stenosis. Left carotid system: Left common and internal carotid arteries widely patent without stenosis, dissection or occlusion. Mild atheromatous change about the proximal left ICA without significant stenosis. Vertebral arteries: Vertebral arteries are diminutive and poorly visualized within the neck, but are grossly patent without stenosis or other abnormality. Skeleton: No visible acute osseous finding. No discrete or worrisome osseous lesions. Other neck: No other acute soft tissue abnormality within the neck. 1.7 cm sebaceous cyst noted at the right submandibular region. Bilateral thyroid nodules noted, recently evaluated on dedicated thyroid ultrasound. No other mass or adenopathy. Upper chest: Visualized upper chest demonstrates no acute finding. Engorgement of the azygos vein noted. Review of the MIP images confirms the above findings CTA HEAD FINDINGS Anterior circulation: Evaluation technically limited by timing the contrast bolus with poor opacification of the intracranial arterial circulation. Both internal carotid arteries remain patent to the termini without stenosis. A1 segments patent. Grossly normal anterior communicating artery complex. Anterior cerebral arteries remain grossly patent to their distal aspects. Left M1 patent. Grossly normal left MCA bifurcation. No visible proximal left M2 branch occlusion. Distal left MCA branches perfused. There now appears to have developed in  occlusion of the right M1 segment (series 4, image 119). There was suggestion of an underlying stenosis at this level on prior CTA. Minimal scant flow seen distally, which could be collateral in nature. An adjacent right temporal branch appears to remain patent. Posterior circulation: Both V4 segments are grossly patent to the vertebrobasilar junction without appreciable stenosis. Left PICA grossly patent. Right PICA not seen. Basilar markedly diminutive but grossly patent to its distal aspect. Superior cerebellar arteries appear patent proximally. Fetal type origin of the PCAs supplied via a robust bilateral posterior communicating arteries. Both PCAs remain grossly perfused to their distal aspects. Venous sinuses: Patent. Anatomic variants: Fetal type origin of the PCAs. No visible aneurysm. Review of the MIP images confirms the above findings CT Brain Perfusion Findings: ASPECTS: Estimated at approximately 6 on prior CT. CBF (<30%) Volume: 34mL Perfusion (Tmax>6.0s) volume: 198mL Mismatch Volume: 35mL Infarction Location:Evidence for acute core infarct at the posterior right frontoparietal region, largely corresponding with the completed infarct as seen on prior MRI. This may have expanded partially in the interim. Evidence for associated subtle leptomeningeal enhancement on corresponding CTA, consistent with the subacute nature of this infarct. Surrounding ischemic penumbra of 57 cc, consider tissue at risk. IMPRESSION: 1. Abrupt occlusion of the right M1 segment, new as compared to prior CTA from 01/25/2021. There is suggestion of an underlying stenosis at this level on prior CTA from 01/25/2021. 2. Acute core infarct at the posterior right frontoparietal region, largely corresponding with the completed infarct as seen  on prior MRI. This may have partially expanded in the interim. Surrounding ischemic penumbra of 57 cc, considered tissue at risk. 3. Fetal type origin of the PCAs with overall diminutive  vertebrobasilar system. 4. Otherwise gross patency of the major arterial vasculature of the head and neck, although evaluation somewhat technically limited by timing of the contrast bolus. Critical Value/emergent results were discussed by telephone at the time of interpretation on 02/02/2021 at 8:36 p.m. to provider [facility] , who verbally acknowledged these results. Electronically Signed   By: Jeannine Boga M.D.   On: 02/02/2021 21:35   CT HEAD CODE STROKE WO CONTRAST  Result Date: 02/02/2021 CLINICAL DATA:  Code stroke.  Left-sided weakness EXAM: CT HEAD WITHOUT CONTRAST TECHNIQUE: Contiguous axial images were obtained from the base of the skull through the vertex without intravenous contrast. COMPARISON:  01/25/2021 FINDINGS: Brain: No acute intracranial hemorrhage. Suspect new loss of gray-white differentiation in the right frontoparietal lobes posterior to the chronic frontal infarct with involvement of lateral precentral and postcentral gyri. Evolution of recent prior infarction involving the right parietal and temporal lobes. Small chronic left cerebellar infarcts are again seen. Stable findings of probable chronic microvascular ischemic changes. No hydrocephalus or extra-axial collection. Vascular: No hyperdense vessel. Skull: Unremarkable. Sinuses/Orbits: No acute abnormality. Other: None. IMPRESSION: No acute intracranial hemorrhage. Suspect acute infarction of the right frontoparietal lobes posterosuperior to chronic infarct with probable involvement of lateral precentral and postcentral gyri. These results were called by telephone at the time of interpretation on 02/02/2021 at 7:57 pm to provider Fredia Sorrow , who verbally acknowledged these results. Electronically Signed   By: Macy Mis M.D.   On: 02/02/2021 20:00   CT ANGIO HEAD CODE STROKE  Result Date: 02/02/2021 CLINICAL DATA:  Initial evaluation for acute stroke. EXAM: CT ANGIOGRAPHY HEAD AND NECK CT PERFUSION BRAIN  TECHNIQUE: Multidetector CT imaging of the head and neck was performed using the standard protocol during bolus administration of intravenous contrast. Multiplanar CT image reconstructions and MIPs were obtained to evaluate the vascular anatomy. Carotid stenosis measurements (when applicable) are obtained utilizing NASCET criteria, using the distal internal carotid diameter as the denominator. Multiphase CT imaging of the brain was performed following IV bolus contrast injection. Subsequent parametric perfusion maps were calculated using RAPID software. CONTRAST:  159mL OMNIPAQUE IOHEXOL 350 MG/ML SOLN COMPARISON:  Prior CT from earlier the same day as well as recent MRI from 01/26/2021 and CTA from 01/25/2021. FINDINGS: CTA NECK FINDINGS Aortic arch: Visualized aortic arch normal in caliber with normal 3 vessel morphology. No hemodynamically significant stenosis about the origin of the great vessels. Right carotid system: Right common and internal carotid arteries widely patent without stenosis, dissection or occlusion. Mild atheromatous change about the origin of the right ICA without significant stenosis. Left carotid system: Left common and internal carotid arteries widely patent without stenosis, dissection or occlusion. Mild atheromatous change about the proximal left ICA without significant stenosis. Vertebral arteries: Vertebral arteries are diminutive and poorly visualized within the neck, but are grossly patent without stenosis or other abnormality. Skeleton: No visible acute osseous finding. No discrete or worrisome osseous lesions. Other neck: No other acute soft tissue abnormality within the neck. 1.7 cm sebaceous cyst noted at the right submandibular region. Bilateral thyroid nodules noted, recently evaluated on dedicated thyroid ultrasound. No other mass or adenopathy. Upper chest: Visualized upper chest demonstrates no acute finding. Engorgement of the azygos vein noted. Review of the MIP images  confirms the above findings CTA HEAD  FINDINGS Anterior circulation: Evaluation technically limited by timing the contrast bolus with poor opacification of the intracranial arterial circulation. Both internal carotid arteries remain patent to the termini without stenosis. A1 segments patent. Grossly normal anterior communicating artery complex. Anterior cerebral arteries remain grossly patent to their distal aspects. Left M1 patent. Grossly normal left MCA bifurcation. No visible proximal left M2 branch occlusion. Distal left MCA branches perfused. There now appears to have developed in occlusion of the right M1 segment (series 4, image 119). There was suggestion of an underlying stenosis at this level on prior CTA. Minimal scant flow seen distally, which could be collateral in nature. An adjacent right temporal branch appears to remain patent. Posterior circulation: Both V4 segments are grossly patent to the vertebrobasilar junction without appreciable stenosis. Left PICA grossly patent. Right PICA not seen. Basilar markedly diminutive but grossly patent to its distal aspect. Superior cerebellar arteries appear patent proximally. Fetal type origin of the PCAs supplied via a robust bilateral posterior communicating arteries. Both PCAs remain grossly perfused to their distal aspects. Venous sinuses: Patent. Anatomic variants: Fetal type origin of the PCAs. No visible aneurysm. Review of the MIP images confirms the above findings CT Brain Perfusion Findings: ASPECTS: Estimated at approximately 6 on prior CT. CBF (<30%) Volume: 31mL Perfusion (Tmax>6.0s) volume: 169mL Mismatch Volume: 59mL Infarction Location:Evidence for acute core infarct at the posterior right frontoparietal region, largely corresponding with the completed infarct as seen on prior MRI. This may have expanded partially in the interim. Evidence for associated subtle leptomeningeal enhancement on corresponding CTA, consistent with the subacute nature of  this infarct. Surrounding ischemic penumbra of 57 cc, consider tissue at risk. IMPRESSION: 1. Abrupt occlusion of the right M1 segment, new as compared to prior CTA from 01/25/2021. There is suggestion of an underlying stenosis at this level on prior CTA from 01/25/2021. 2. Acute core infarct at the posterior right frontoparietal region, largely corresponding with the completed infarct as seen on prior MRI. This may have partially expanded in the interim. Surrounding ischemic penumbra of 57 cc, considered tissue at risk. 3. Fetal type origin of the PCAs with overall diminutive vertebrobasilar system. 4. Otherwise gross patency of the major arterial vasculature of the head and neck, although evaluation somewhat technically limited by timing of the contrast bolus. Critical Value/emergent results were discussed by telephone at the time of interpretation on 02/02/2021 at 8:36 p.m. to provider [facility] , who verbally acknowledged these results. Electronically Signed   By: Jeannine Boga M.D.   On: 02/02/2021 21:35   CT ANGIO NECK CODE STROKE  Result Date: 02/02/2021 CLINICAL DATA:  Initial evaluation for acute stroke. EXAM: CT ANGIOGRAPHY HEAD AND NECK CT PERFUSION BRAIN TECHNIQUE: Multidetector CT imaging of the head and neck was performed using the standard protocol during bolus administration of intravenous contrast. Multiplanar CT image reconstructions and MIPs were obtained to evaluate the vascular anatomy. Carotid stenosis measurements (when applicable) are obtained utilizing NASCET criteria, using the distal internal carotid diameter as the denominator. Multiphase CT imaging of the brain was performed following IV bolus contrast injection. Subsequent parametric perfusion maps were calculated using RAPID software. CONTRAST:  130mL OMNIPAQUE IOHEXOL 350 MG/ML SOLN COMPARISON:  Prior CT from earlier the same day as well as recent MRI from 01/26/2021 and CTA from 01/25/2021. FINDINGS: CTA NECK FINDINGS  Aortic arch: Visualized aortic arch normal in caliber with normal 3 vessel morphology. No hemodynamically significant stenosis about the origin of the great vessels. Right carotid system: Right common  and internal carotid arteries widely patent without stenosis, dissection or occlusion. Mild atheromatous change about the origin of the right ICA without significant stenosis. Left carotid system: Left common and internal carotid arteries widely patent without stenosis, dissection or occlusion. Mild atheromatous change about the proximal left ICA without significant stenosis. Vertebral arteries: Vertebral arteries are diminutive and poorly visualized within the neck, but are grossly patent without stenosis or other abnormality. Skeleton: No visible acute osseous finding. No discrete or worrisome osseous lesions. Other neck: No other acute soft tissue abnormality within the neck. 1.7 cm sebaceous cyst noted at the right submandibular region. Bilateral thyroid nodules noted, recently evaluated on dedicated thyroid ultrasound. No other mass or adenopathy. Upper chest: Visualized upper chest demonstrates no acute finding. Engorgement of the azygos vein noted. Review of the MIP images confirms the above findings CTA HEAD FINDINGS Anterior circulation: Evaluation technically limited by timing the contrast bolus with poor opacification of the intracranial arterial circulation. Both internal carotid arteries remain patent to the termini without stenosis. A1 segments patent. Grossly normal anterior communicating artery complex. Anterior cerebral arteries remain grossly patent to their distal aspects. Left M1 patent. Grossly normal left MCA bifurcation. No visible proximal left M2 branch occlusion. Distal left MCA branches perfused. There now appears to have developed in occlusion of the right M1 segment (series 4, image 119). There was suggestion of an underlying stenosis at this level on prior CTA. Minimal scant flow seen  distally, which could be collateral in nature. An adjacent right temporal branch appears to remain patent. Posterior circulation: Both V4 segments are grossly patent to the vertebrobasilar junction without appreciable stenosis. Left PICA grossly patent. Right PICA not seen. Basilar markedly diminutive but grossly patent to its distal aspect. Superior cerebellar arteries appear patent proximally. Fetal type origin of the PCAs supplied via a robust bilateral posterior communicating arteries. Both PCAs remain grossly perfused to their distal aspects. Venous sinuses: Patent. Anatomic variants: Fetal type origin of the PCAs. No visible aneurysm. Review of the MIP images confirms the above findings CT Brain Perfusion Findings: ASPECTS: Estimated at approximately 6 on prior CT. CBF (<30%) Volume: 22mL Perfusion (Tmax>6.0s) volume: 186mL Mismatch Volume: 33mL Infarction Location:Evidence for acute core infarct at the posterior right frontoparietal region, largely corresponding with the completed infarct as seen on prior MRI. This may have expanded partially in the interim. Evidence for associated subtle leptomeningeal enhancement on corresponding CTA, consistent with the subacute nature of this infarct. Surrounding ischemic penumbra of 57 cc, consider tissue at risk. IMPRESSION: 1. Abrupt occlusion of the right M1 segment, new as compared to prior CTA from 01/25/2021. There is suggestion of an underlying stenosis at this level on prior CTA from 01/25/2021. 2. Acute core infarct at the posterior right frontoparietal region, largely corresponding with the completed infarct as seen on prior MRI. This may have partially expanded in the interim. Surrounding ischemic penumbra of 57 cc, considered tissue at risk. 3. Fetal type origin of the PCAs with overall diminutive vertebrobasilar system. 4. Otherwise gross patency of the major arterial vasculature of the head and neck, although evaluation somewhat technically limited by  timing of the contrast bolus. Critical Value/emergent results were discussed by telephone at the time of interpretation on 02/02/2021 at 8:36 p.m. to provider [facility] , who verbally acknowledged these results. Electronically Signed   By: Jeannine Boga M.D.   On: 02/02/2021 21:35    PHYSICAL EXAM General: Awake alert in no distress HEENT: Normocephalic atraumatic CVs: Regular rate rhythm  Respiratory: Breathing well saturating normally on room air Extremities: Warm well perfused Neurological exam Awake alert oriented x3 Slightly hypophonic No evidence of aphasia No dysarthria Cranial nerves: Pupils equal round react light, extraocular movements intact, visual fields appear full, face appears symmetric, facial sensation appears intact. Motor exam: Left upper extremity is barely 1/5.  Left lower extremity is 1-2/5.  Right upper and lower extremity. Sensory exam: Diminished on the left, also extinguishes on the left to double simultaneous stimulation. Coordination: No dysmetria on the right.  Unable to perform on the left Gait testing deferred   ASSESSMENT/PLAN [name] is a 60 y.o. male with history of hypertension, hyperlipidemia, DVT/PE on Eliquis, sickle cell disease presenting with sudden onset of left arm and leg weakness along with left facial droop with a last known well of 1500 hrs. on 02/02/2021.  Seen by telemedicine neurology and transported to [facility] for higher level of care due to right MCA M1 filling deficit and perfusion deficit on perfusion imaging-see details below.  Patient was seen in December/January as well as again in February for right MCA strokes-first 1 in the right M1 superior division and the second 1 involving patchy distribution of both superior and inferior division of the right MCAs.  This time around, CTA showed no abrupt occlusion of the right M1 segment which is new as compared to the prior CTA from February 2022 suggesting  underlying stenosis on the prior CTA that probably now occluded. There was an acute core infarct in the posterior right frontoparietal region likely corresponding with the completed infarct as seen on prior MRI but there was presumably penumbra of 57 cc considered tissue at risk for which he was taken for emergent IR thrombectomy.  Stroke-likely atheroembolic from large vessel arterial sclerosis Code Stroke CT head no acute ICH.  Suspect acute infarction of the right frontoparietal lobes posterior superior to chronic infarct with probable involvement of lateral precentral and postcentral gyri. CTA head & neck-abrupt right M1 segment occlusion new as compared to CTA from 01/25/2021.  This is suggestive of an underlying stenosis at this level on prior CTA from 01/25/2021.  Acute cord infarct at the posterior right frontoparietal region likely corresponding with the completed infarct as seen on prior MRI.  This may have partially expanded in the interim.  Surrounding ischemic penumbra 57 cc-consider tissue address.  Fetal PCA bilaterally and overall diminutive vertebrobasilar system.  Technically limited by contrast bolus CT perfusion -57 cc penumbra-in hindsight likely overestimating. CT head after the procedure 9 AM today-progressive nonhemorrhagic cortical infarct in the posterior right MCA territory with further loss of gray-white differentiation.  Progressive edema and mass-effect with 2 to 3 mm midline shift.  Hyperdense areas in the sulci likely reflective of pseudosubarachnoid hemorrhage with areas of cortex adjacent to 1 another.  No definitive hemorrhage is present.  Stable remote infarct of the anterior right MCA territory. MRI pending 2D Echo completed on 01/22/2021 with LVEF 55 to 60%, left ventricle with regional wall motion abnormalities.  Left ventricular hypertrophy-mild.  Diastolic parameters of the left ventricle indeterminate.  Right ventricular systolic function normal.  Mitral valve normal.   Aortic valve is tricuspid and normal.  Left atrium normal. No need to repeat echo LDL 25 HgbA1c 5.9 VTE prophylaxis -SCDs for now.  May start chemical prophylaxis after MRI is completed and shows no large bleed.    Diet   Diet NPO time specified   Eliquis (apixaban) daily prior to admission, now on  No antithrombotic.  Start low-dose aspirin.  Decision on anticoagulation after MRI Therapy recommendations: Pending Disposition: Pending  Hypertension, Hypertensive urgency Home meds: Amlodipine 5 mg. Can do Lisinopril in attempt to wean cleviprex Unstable-on Cleviprex.  Also on labetalol and hydralazine PRN Permissive hypertension (OK if < 220/120) but gradually normalize in 5-7 days Long-term BP goal normotensive  Hyperlipidemia Home meds:  Lipitor 40 LDL 25, goal < 70 Will reduce to Lipitor 20 Continue statin at discharge  Other risk factors HgbA1c 5.9, goal < 7.0  Other Stroke Risk Factors Cigarette smoker advised to stop smoking Obesity, Body mass index is 33.94 kg/m., BMI >/= 30 associated with increased stroke risk, recommend weight loss, diet and exercise as appropriate  Hx stroke/TIA OSA  Hospital day # 1   -- Amie Portland, MD Neurologist [facility] Pager: [phone]  CRITICAL CARE ATTESTATION Performed by: Amie Portland, MD Total critical care time: 60 minutes Critical care time was exclusive of separately billable procedures and treating other patients and/or supervising APPs/Residents/Students Critical care was necessary to treat or prevent imminent or life-threatening deterioration due to acute ischemic stroke status post mechanical thrombectomy This patient is critically ill and at significant risk for neurological worsening and/or death and care requires constant monitoring. Critical care was time spent personally by me on the following activities: development of treatment plan with patient and/or surrogate as well as nursing, discussions with  consultants, evaluation of patient's response to treatment, examination of patient, obtaining history from patient or surrogate, ordering and performing treatments and interventions, ordering and review of laboratory studies, ordering and review of radiographic studies, pulse oximetry, re-evaluation of patient's condition, participation in multidisciplinary rounds and medical decision making of high complexity in the care of this patient.    To contact Stroke Continuity provider, please refer to [email or web address] After hours, contact General Neurology ADDENDUM-CDI query Cerebral edema as expected with a large size stroke.  Currently not likely contributing to clinical presentation.  Close monitoring with serial CT scans.  If midline shift increases, will treat with hypertonic saline.

## 2021-02-03 NOTE — Progress Notes (Signed)
PT Cancellation Note  Patient Details Name: Matthew Wise MRN: [id number] DOB: 1961/03/04   Cancelled Treatment:    Reason Eval/Treat Not Completed: Active bedrest order this morning. PT will continue to follow and evaluate as appropriate.   Karma Ganja, PT, DPT   Acute Rehabilitation Department Pager #: [phone]   Otho Bellows 02/03/2021, 7:44 AM

## 2021-02-03 NOTE — Progress Notes (Signed)
Continuing BP management with clevidipine after the 24-hour mark since revascularization by Interventional Neuroradiology. Goal will continue to be 120-140. AM Stroke Team to determine further refinements to antihypertensive regimen.   Electronically signed: Dr. Kerney Elbe

## 2021-02-03 NOTE — Progress Notes (Signed)
Okay to titrate clevidipine higher than >21mg /hr Labetalol 10mg  every 2 hours as needed (ordered).  To maintain SBP 120-140.

## 2021-02-03 NOTE — Progress Notes (Signed)
1928 call time 1932 beeper time 1939 exam started 1942 exam finished 1942 images sent to soc 1942 exam completed in [facility] radiology called

## 2021-02-03 NOTE — Progress Notes (Signed)
Informed MD Amie Portland of arterial line BP reading consistently higher with no changed than cuff pressures despite zeroing, flushing, giving PRNs and titrating cleviprex gtt. Manual cuff BP reading more consistently lower than art line. Verbal order obtained to go by cuff pressures instead of art line.

## 2021-02-03 NOTE — Progress Notes (Signed)
Patient ID: Matthew Wise, male   DOB: [birth year], 60 y.o.   MRN: [id number] INR. [place] 1500 02/02/21. MRS 1  New onset Lt sided weakness . CT brainNo ICH ASPECTS 6 CTA occluded RTMCA M 1 seg CTP core of 84 and Tmax > 6 s of 125ml .Occuracy? In view of recent stroke on Feb 24th. Given patients MRS of 1 Option of endovascukar revascularization of occluded RT MCA D/W sister in a 3 way witnessed call. Procedure,reasons,alternatives reviewed. Risks of ICH of 10 % ,worsening Neuro deficit,death ,inability to revascularize reviewed. Sister expressed understanding and gave consent to treat. S.Deveshwar MD

## 2021-02-03 NOTE — Procedures (Signed)
S/P RT common carotid arteriogram followed by revascularization of occluded Rt MCA M1 seg with x 2 passes with 79mm x 40 mm soliaire x retriever and aspiration achieving a TICI 2 C revascularization . POst CT prominent cortical enhancement with edema of the RT temporal parietal cerebral convexity probablr luxury perfusion.  Contrast stain in the RT post putamen. 78F angioseal for RT CFA hemostasis. Distal pulses dopplerable RT PT and DP and Lt PT. Palpable LT DP. Extubated. Pupils 48mm R = LT sluggish. Moves RT A and L to command. No movement on the Lt. S.Boruch Manuele MD

## 2021-02-04 ENCOUNTER — Inpatient Hospital Stay (HOSPITAL_COMMUNITY): Payer: Medicare Other

## 2021-02-04 DIAGNOSIS — M79605 Pain in left leg: Secondary | ICD-10-CM | POA: Diagnosis not present

## 2021-02-04 DIAGNOSIS — R569 Unspecified convulsions: Secondary | ICD-10-CM | POA: Diagnosis not present

## 2021-02-04 DIAGNOSIS — M79604 Pain in right leg: Secondary | ICD-10-CM | POA: Diagnosis not present

## 2021-02-04 DIAGNOSIS — M7989 Other specified soft tissue disorders: Secondary | ICD-10-CM | POA: Diagnosis not present

## 2021-02-04 DIAGNOSIS — I6601 Occlusion and stenosis of right middle cerebral artery: Secondary | ICD-10-CM | POA: Diagnosis not present

## 2021-02-04 LAB — CBC WITH DIFFERENTIAL/PLATELET
Abs Immature Granulocytes: 0.02 10*3/uL (ref 0.00–0.07)
Basophils Absolute: 0 10*3/uL (ref 0.0–0.1)
Basophils Relative: 0 %
Eosinophils Absolute: 0.1 10*3/uL (ref 0.0–0.5)
Eosinophils Relative: 1 %
HCT: 36.1 % — ABNORMAL LOW (ref 39.0–52.0)
Hemoglobin: 12.8 g/dL — ABNORMAL LOW (ref 13.0–17.0)
Immature Granulocytes: 0 %
Lymphocytes Relative: 15 %
Lymphs Abs: 1.4 10*3/uL (ref 0.7–4.0)
MCH: 29.3 pg (ref 26.0–34.0)
MCHC: 35.5 g/dL (ref 30.0–36.0)
MCV: 82.6 fL (ref 80.0–100.0)
Monocytes Absolute: 0.9 10*3/uL (ref 0.1–1.0)
Monocytes Relative: 10 %
Neutro Abs: 6.5 10*3/uL (ref 1.7–7.7)
Neutrophils Relative %: 74 %
Platelets: 194 10*3/uL (ref 150–400)
RBC: 4.37 MIL/uL (ref 4.22–5.81)
RDW: 13.6 % (ref 11.5–15.5)
WBC: 8.9 10*3/uL (ref 4.0–10.5)
nRBC: 0 % (ref 0.0–0.2)

## 2021-02-04 LAB — COMPREHENSIVE METABOLIC PANEL
ALT: 23 U/L (ref 0–44)
AST: 33 U/L (ref 15–41)
Albumin: 3.6 g/dL (ref 3.5–5.0)
Alkaline Phosphatase: 83 U/L (ref 38–126)
Anion gap: 9 (ref 5–15)
BUN: 9 mg/dL (ref 6–20)
CO2: 22 mmol/L (ref 22–32)
Calcium: 9.1 mg/dL (ref 8.9–10.3)
Chloride: 107 mmol/L (ref 98–111)
Creatinine, Ser: 1.64 mg/dL — ABNORMAL HIGH (ref 0.61–1.24)
GFR, Estimated: 48 mL/min — ABNORMAL LOW (ref >60)
Glucose, Bld: 121 mg/dL — ABNORMAL HIGH (ref 70–99)
Potassium: 3.5 mmol/L (ref 3.5–5.1)
Sodium: 138 mmol/L (ref 135–145)
Total Bilirubin: 0.5 mg/dL (ref 0.3–1.2)
Total Protein: 6.8 g/dL (ref 6.5–8.1)

## 2021-02-04 LAB — APTT: aPTT: 43 seconds — ABNORMAL HIGH (ref 24–36)

## 2021-02-04 LAB — HEPARIN LEVEL (UNFRACTIONATED): Heparin Unfractionated: 0.87 IU/mL — ABNORMAL HIGH (ref 0.30–0.70)

## 2021-02-04 IMAGING — CT CT HEAD W/O CM
4 series · 17 of 47 positions shown, 19 images · non-contrast
Comparison: [DATE]

CLINICAL DATA: Headache

EXAM:
CT HEAD WITHOUT CONTRAST
TECHNIQUE: Contiguous axial images were obtained from the base of the skull
through the vertex without intravenous contrast.

[Series 3: head without · axial · non-contrast · 0.41mm/px · z∈[-140,+10]mm · 7 of 40 slices shown, 9 images]
[im 5/40  brain]
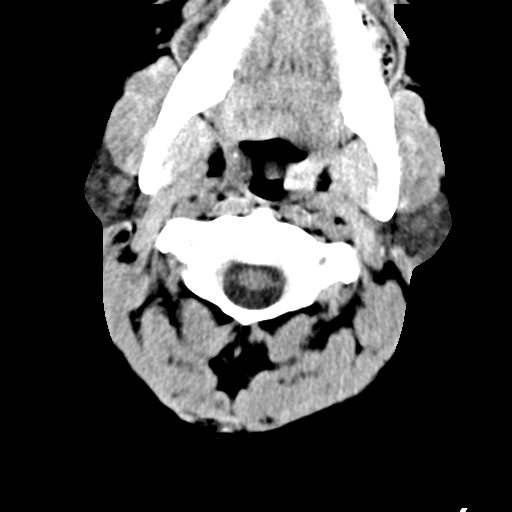
[im 5/40  bone]
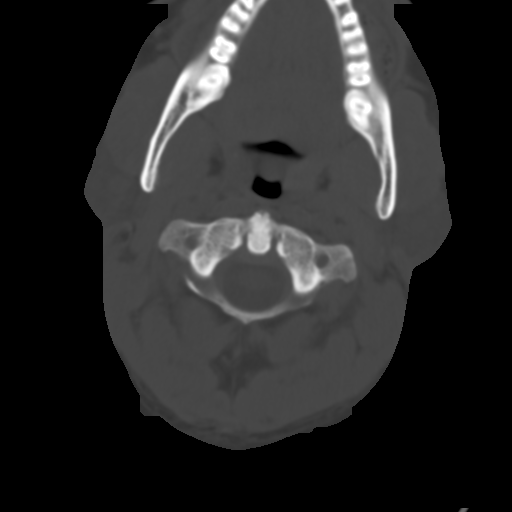
[im 10/40  brain]
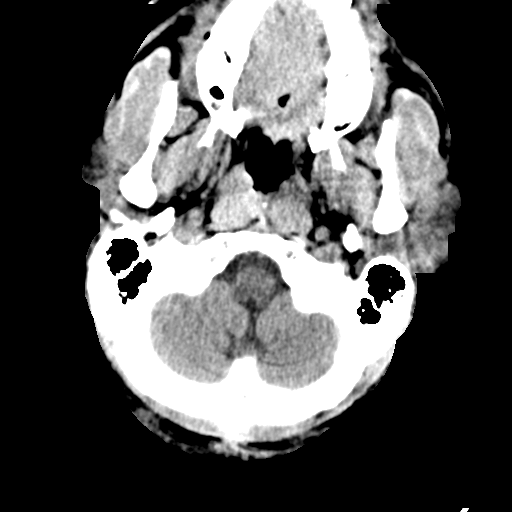
[im 15/40  brain]
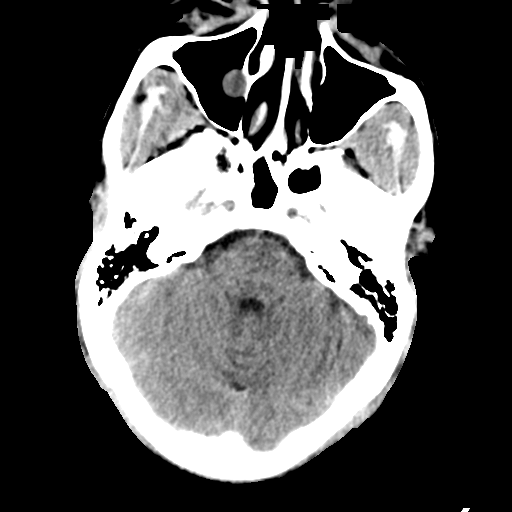
[im 20/40  brain]
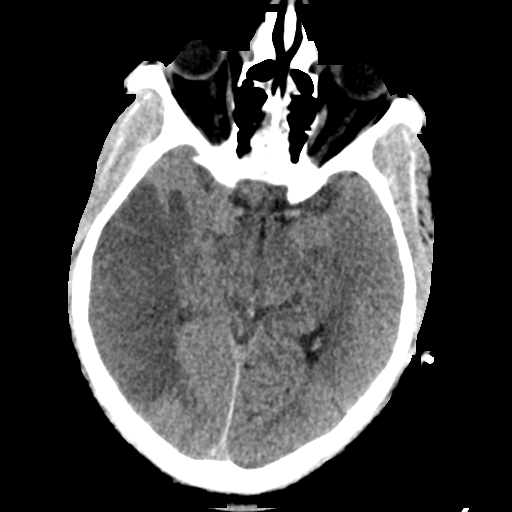
[im 25/40  brain]
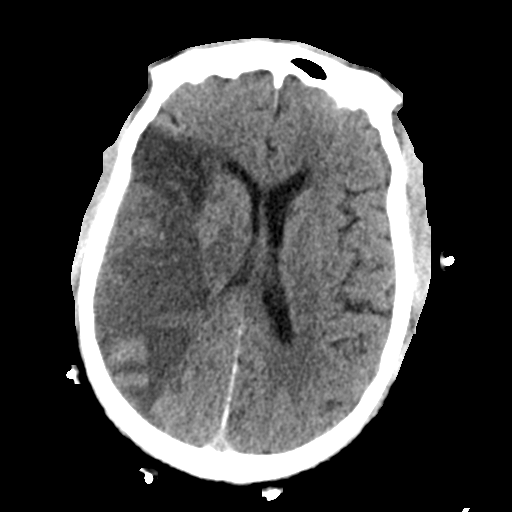
[im 25/40  bone]
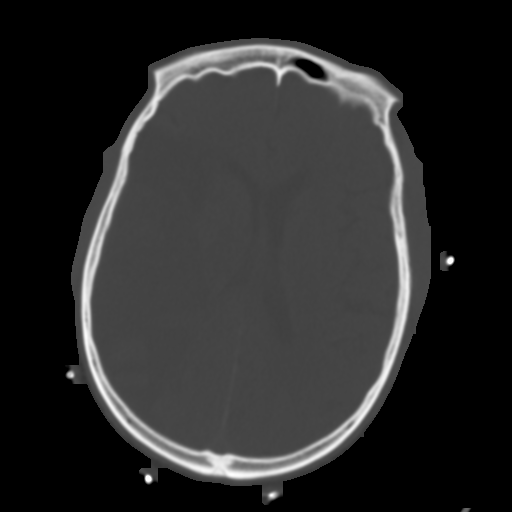
[im 30/40  brain]
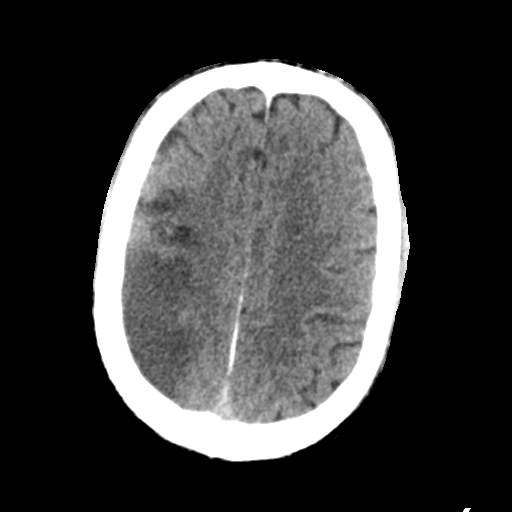
[im 35/40  brain]
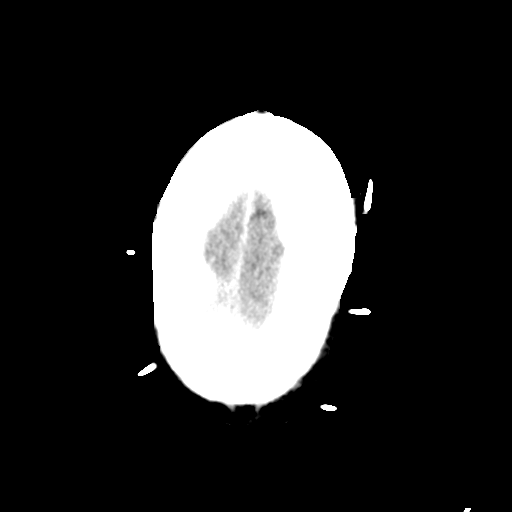

[Series 4: head bone · axial · 0.41mm/px · z∈[-142,-72]mm · 4 of 100 slices shown]
[im 10/100  bone]
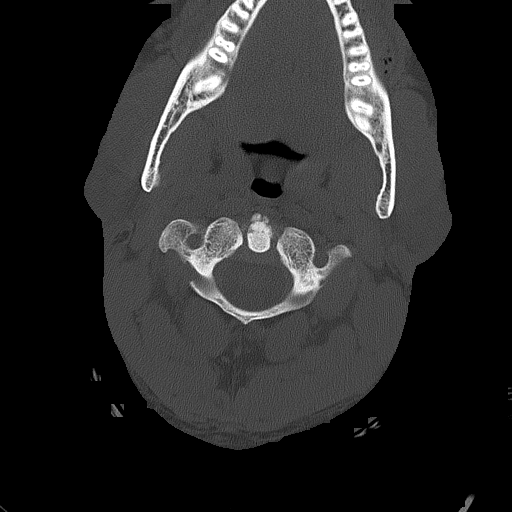
[im 20/100  bone]
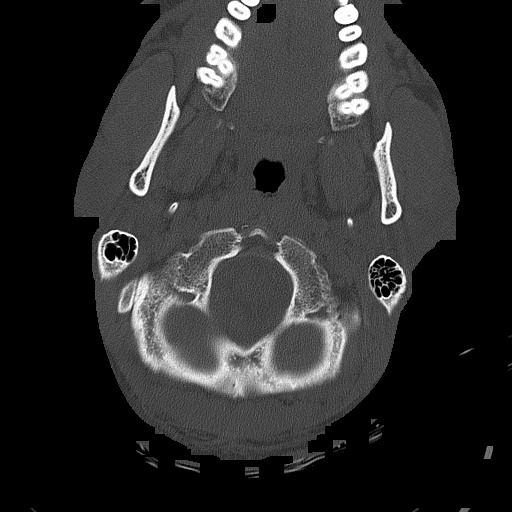
[im 30/100  bone]
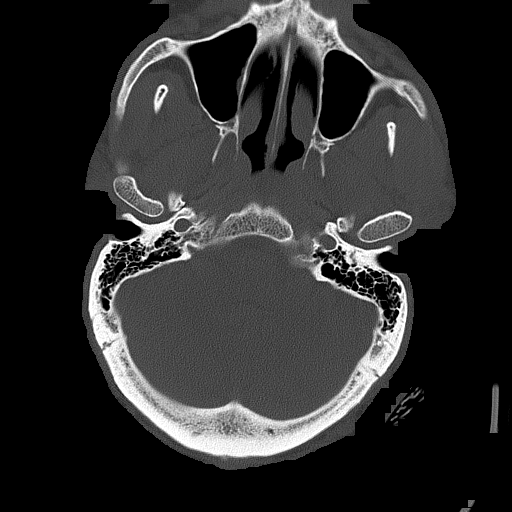
[im 45/100  bone]
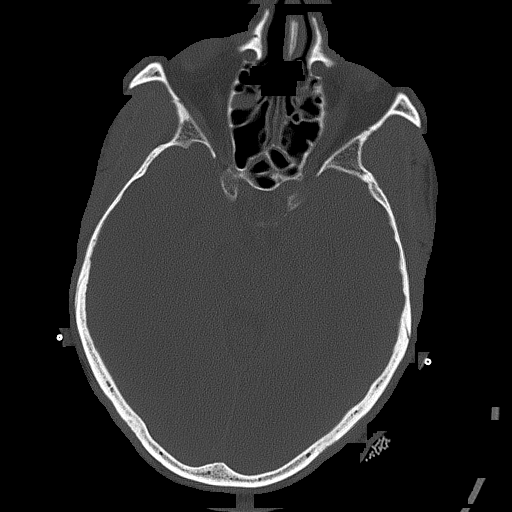

[Series 5: head without cor · coronal · non-contrast · 0.38mm/px · 3 of 70 slices shown]
[im 24/70  brain]
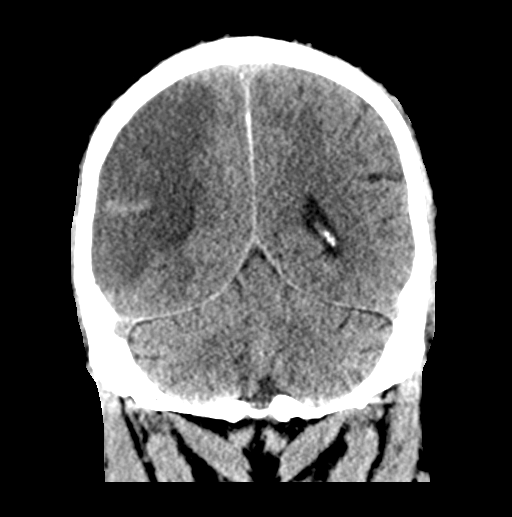
[im 31/70  brain]
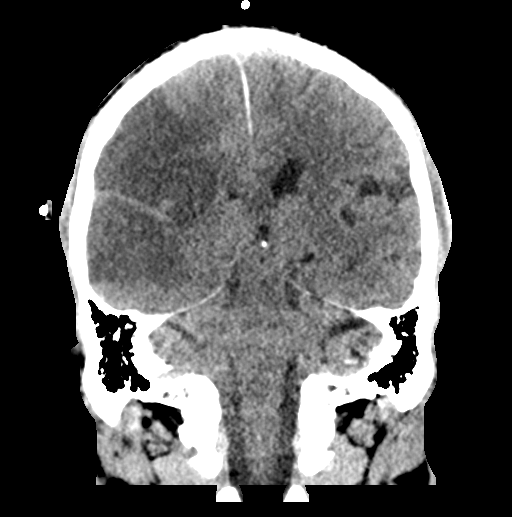
[im 39/70  brain]
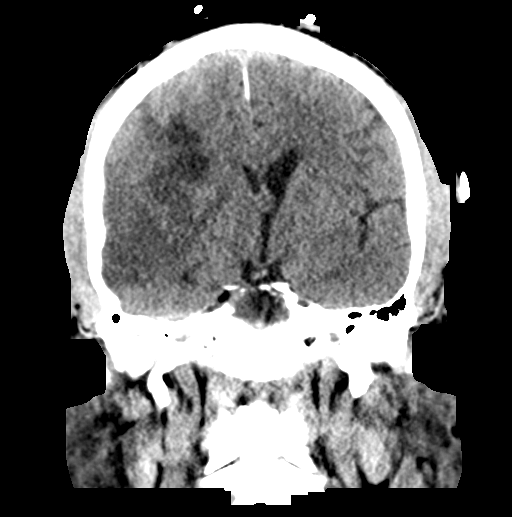

[Series 6: head without sag · sagittal · non-contrast · 0.39mm/px · 3 of 63 slices shown]
[im 21/63  brain]
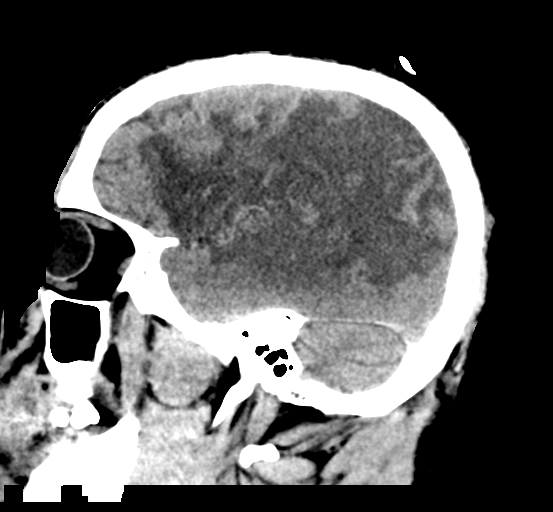
[im 32/63  brain]
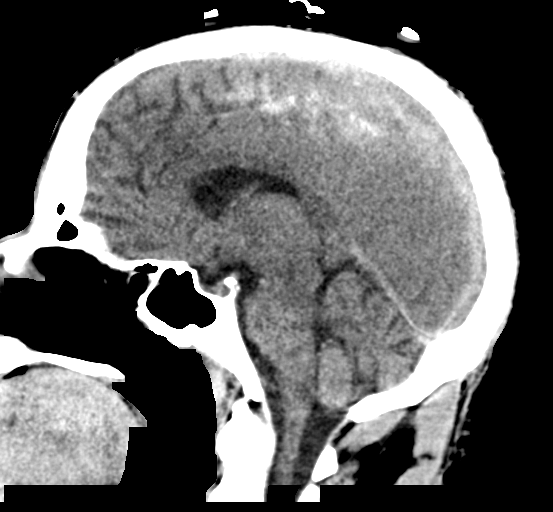
[im 42/63  brain]
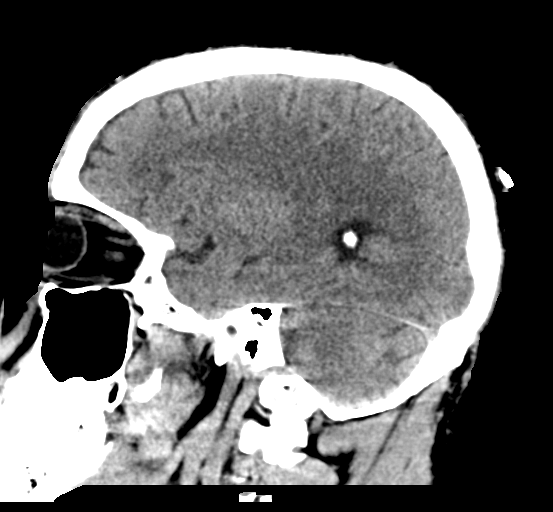

[17 of 47 positions shown; findings below may reference images not displayed]

FINDINGS: Brain: Large right MCA territory infarct with associated
subarachnoid hemorrhage. The area of infarction has increased in
size with greater temporal lobe involvement. No hydrocephalus.
Increased mass effect on the right lateral ventricle. There is left
midline shift of 3 mm. Small left cerebellar infarct.

Vascular: No abnormal hyperdensity of the major intracranial
arteries or dural venous sinuses. No intracranial atherosclerosis.

Skull: The visualized skull base, calvarium and extracranial soft
tissues are normal.

Sinuses/Orbits: No fluid levels or advanced mucosal thickening of
the visualized paranasal sinuses. No mastoid or middle ear effusion.
The orbits are normal.
IMPRESSION: 1. Increased size of large right MCA territory infarct with
unchanged subarachnoid hemorrhage.
2. Increased mass effect on the right lateral ventricle and 3 mm of
left midline shift.
3. Small left cerebellar infarct.

## 2021-02-04 MED ORDER — HEPARIN (PORCINE) 25000 UT/250ML-% IV SOLN
1750.0000 [IU]/h | INTRAVENOUS | Status: DC
  Administered 2021-02-04: 1200 [IU]/h via INTRAVENOUS
  Administered 2021-02-05: 1600 [IU]/h via INTRAVENOUS
  Filled 2021-02-04 (×2): qty 250

## 2021-02-04 MED ORDER — AMLODIPINE BESYLATE 10 MG PO TABS
10.0000 mg | ORAL_TABLET | Freq: Every day | ORAL | Status: DC
  Administered 2021-02-04 – 2021-02-12 (×9): 10 mg via ORAL
  Filled 2021-02-04 (×9): qty 1

## 2021-02-04 MED ORDER — ASPIRIN EC 81 MG PO TBEC
81.0000 mg | DELAYED_RELEASE_TABLET | Freq: Every day | ORAL | Status: DC
  Administered 2021-02-04 – 2021-02-12 (×9): 81 mg via ORAL
  Filled 2021-02-04 (×9): qty 1

## 2021-02-04 MED ORDER — DOCUSATE SODIUM 100 MG PO CAPS
100.0000 mg | ORAL_CAPSULE | Freq: Every day | ORAL | Status: DC
  Administered 2021-02-04 – 2021-02-12 (×9): 100 mg via ORAL
  Filled 2021-02-04 (×9): qty 1

## 2021-02-04 NOTE — Progress Notes (Signed)
Referring Physician(s): Code stroke- Gwinda Maine (neurology)  Supervising Physician: Luanne Bras  Patient Status:  [facility] - In-pt  Chief Complaint: Stroke F/U  Subjective:  History of acute CVA s/p cerebral arteriogram with emergent mechanical thrombectomy of right MCA M1 occlusion achieving a TICI 2c revascularization via right femoral approach 02/02/2021 by Dr. Estanislado Pandy. Patient awake and alert laying in bed. Can spontaneously move right side, no movements of left side. Right femoral puncture site c/d/i.  MR brain 02/03/2021: 1. Large acute right MCA territory infarct with petechial hemorrhage and minimal leftward midline shift, unchanged from today's CT. 2. Small acute left cerebellar infarct. 3. New small acute to early subacute infarcts in the left occipital, left parietal, and left frontal lobes.   Allergies: Patient has no known allergies.  Medications: Prior to Admission medications   Medication Sig Start Date End Date Taking? Authorizing Provider  amLODipine (NORVASC) 5 MG tablet TAKE 1 TABLET (5 MG TOTAL) BY MOUTH DAILY. 01/07/21  Yes Gosrani, Nimish C, MD  apixaban (ELIQUIS) 5 MG TABS tablet Take 1 tablet (5 mg total) by mouth 2 (two) times daily. 01/08/21  Yes Gosrani, Nimish C, MD  atorvastatin (LIPITOR) 40 MG tablet TAKE 1 TABLET BY MOUTH EVERY DAY Patient taking differently: Take 40 mg by mouth daily. 12/23/20  Yes Gosrani, Nimish C, MD  fluticasone (FLONASE) 50 MCG/ACT nasal spray Place 1 spray into both nostrils daily as needed for allergies or rhinitis.   Yes [provider]  levothyroxine (SYNTHROID) 25 MCG tablet TAKE 1 TABLET BY MOUTH EVERY DAY Patient taking differently: Take 25 mcg by mouth daily before breakfast. 01/07/21  Yes Gosrani, Nimish C, MD  Multiple Vitamins-Minerals (CENTRUM ADULTS PO) Take 1 tablet by mouth daily.   Yes [provider]     Vital Signs: BP 129/61   Pulse 69   Temp 98.2 F (36.8 C) (Oral)   Resp 18    Wt 257 lb 4.4 oz (116.7 kg)   SpO2 94%   BMI 33.94 kg/m   Physical Exam Vitals and nursing note reviewed.  Constitutional:      General: He is not in acute distress. Pulmonary:     Effort: Pulmonary effort is normal. No respiratory distress.  Skin:    General: Skin is warm and dry.     Comments: Right femoral puncture site soft without active bleeding or hematoma.  Neurological:     Mental Status: He is alert.     Comments: Alert, awake, and oriented x3. Speech and comprehension intact. PERRL bilaterally. EOMs intact bilaterally without nystagmus or subjective diplopia. Left eye visual field deficit. Mild left facial droop. Can spontaneously move right side, no movements of left side. Distal pulses (DPs) 1+ bilaterally.     Imaging: CT HEAD WO CONTRAST  Result Date: 02/03/2021 CLINICAL DATA:  Status post revascularization of right M1 occlusion with right MCA infarct and ischemia. EXAM: CT HEAD WITHOUT CONTRAST TECHNIQUE: Contiguous axial images were obtained from the base of the skull through the vertex without intravenous contrast. COMPARISON:  CT head without contrast 02/02/2021. CT perfusion and CTA head and neck 02/02/2021. MR head without contrast 01/26/2021. FINDINGS: Brain: Progressive cortical infarct is present in the posterior right MCA territory with further loss of gray-white differentiation. Remote infarct in the anterior right MCA territory is stable. Progressive edema and mass effect is present with effacement the sulci on the right. Hyperdense areas in the sulci likely reflect pseudo subarachnoid hemorrhage with areas of cortex adjacent to  one another. No definite hemorrhage is present. There is some mass effect with 2-3 mm of midline shift. Partial effacement of the right lateral ventricle is noted. Left hemisphere is stable. Brainstem and cerebellum are unremarkable. Vascular: No hyperdense vessel or unexpected calcification. Skull: Calvarium is intact. No focal lytic  or blastic lesions are present. No significant extracranial soft tissue lesion is present. Sinuses/Orbits: A polyp or mucous retention cyst in the right maxillary sinus is stable. The paranasal sinuses and mastoid air cells are otherwise clear. The globes and orbits are within normal limits. IMPRESSION: 1. Progressive nonhemorrhagic cortical infarct in the posterior right MCA territory with further loss of gray-white differentiation. 2. Progressive edema and mass effect with 2-3 mm of midline shift. 3. Hyperdense areas in the sulci likely reflect pseudo subarachnoid hemorrhage with areas of cortex adjacent to one another. No definite hemorrhage is present. 4. Stable remote infarct of the anterior right MCA territory. Electronically Signed   By: San Morelle M.D.   On: 02/03/2021 09:22   MR BRAIN WO CONTRAST  Result Date: 02/03/2021 CLINICAL DATA:  Stroke follow-up. Status post revascularization of right M1 occlusion. EXAM: MRI HEAD WITHOUT CONTRAST TECHNIQUE: Multiplanar, multiecho pulse sequences of the brain and surrounding structures were obtained without intravenous contrast. COMPARISON:  Head CT 02/03/2021 and MRI 01/26/2021 FINDINGS: Brain: A large acute right MCA territory infarct is again noted involving portions of the frontal, parietal, temporal, and superolateral occipital lobes as well as basal ganglia and insula, larger than that on the prior MRI. There is associated cytotoxic edema with sulcal effacement, partial effacement of the right lateral ventricle, and trace leftward midline shift, unchanged from today's earlier CT. There is associated petechial hemorrhage. A chronic infarct is noted more anteriorly in the right frontal lobe and insula with chronic blood products. An 8 mm acute left cerebellar infarct is new from the prior MRI. There are also new punctate acute to early subacute infarcts scattered in the left occipital, left parietal, and left frontal lobes. Small subacute infarcts  are again seen in the pons. There also chronic bilateral cerebellar infarcts. Scattered small foci of T2 hyperintensity in the cerebral white matter bilaterally are unchanged from the prior MRI and are nonspecific but compatible with mild chronic small vessel ischemic disease. There is no age advanced cerebral atrophy. There is no extra-axial fluid collection. Vascular: Major intracranial vascular flow voids are grossly preserved with a hypoplastic vertebrobasilar circulation. Skull and upper cervical spine: Unremarkable bone marrow signal. Sinuses/Orbits: Unremarkable orbits. Mild mucosal thickening and small mucous retention cysts in the right maxillary sinus. Clear mastoid air cells. Other: None. IMPRESSION: 1. Large acute right MCA territory infarct with petechial hemorrhage and minimal leftward midline shift, unchanged from today's CT. 2. Small acute left cerebellar infarct. 3. New small acute to early subacute infarcts in the left occipital, left parietal, and left frontal lobes. Electronically Signed   By: Logan Bores M.D.   On: 02/03/2021 18:52   CT CEREBRAL PERFUSION W CONTRAST  Result Date: 02/02/2021 CLINICAL DATA:  Initial evaluation for acute stroke. EXAM: CT ANGIOGRAPHY HEAD AND NECK CT PERFUSION BRAIN TECHNIQUE: Multidetector CT imaging of the head and neck was performed using the standard protocol during bolus administration of intravenous contrast. Multiplanar CT image reconstructions and MIPs were obtained to evaluate the vascular anatomy. Carotid stenosis measurements (when applicable) are obtained utilizing NASCET criteria, using the distal internal carotid diameter as the denominator. Multiphase CT imaging of the brain was performed following IV bolus contrast injection.  Subsequent parametric perfusion maps were calculated using RAPID software. CONTRAST:  155mL OMNIPAQUE IOHEXOL 350 MG/ML SOLN COMPARISON:  Prior CT from earlier the same day as well as recent MRI from 01/26/2021 and CTA from  01/25/2021. FINDINGS: CTA NECK FINDINGS Aortic arch: Visualized aortic arch normal in caliber with normal 3 vessel morphology. No hemodynamically significant stenosis about the origin of the great vessels. Right carotid system: Right common and internal carotid arteries widely patent without stenosis, dissection or occlusion. Mild atheromatous change about the origin of the right ICA without significant stenosis. Left carotid system: Left common and internal carotid arteries widely patent without stenosis, dissection or occlusion. Mild atheromatous change about the proximal left ICA without significant stenosis. Vertebral arteries: Vertebral arteries are diminutive and poorly visualized within the neck, but are grossly patent without stenosis or other abnormality. Skeleton: No visible acute osseous finding. No discrete or worrisome osseous lesions. Other neck: No other acute soft tissue abnormality within the neck. 1.7 cm sebaceous cyst noted at the right submandibular region. Bilateral thyroid nodules noted, recently evaluated on dedicated thyroid ultrasound. No other mass or adenopathy. Upper chest: Visualized upper chest demonstrates no acute finding. Engorgement of the azygos vein noted. Review of the MIP images confirms the above findings CTA HEAD FINDINGS Anterior circulation: Evaluation technically limited by timing the contrast bolus with poor opacification of the intracranial arterial circulation. Both internal carotid arteries remain patent to the termini without stenosis. A1 segments patent. Grossly normal anterior communicating artery complex. Anterior cerebral arteries remain grossly patent to their distal aspects. Left M1 patent. Grossly normal left MCA bifurcation. No visible proximal left M2 branch occlusion. Distal left MCA branches perfused. There now appears to have developed in occlusion of the right M1 segment (series 4, image 119). There was suggestion of an underlying stenosis at this level on  prior CTA. Minimal scant flow seen distally, which could be collateral in nature. An adjacent right temporal branch appears to remain patent. Posterior circulation: Both V4 segments are grossly patent to the vertebrobasilar junction without appreciable stenosis. Left PICA grossly patent. Right PICA not seen. Basilar markedly diminutive but grossly patent to its distal aspect. Superior cerebellar arteries appear patent proximally. Fetal type origin of the PCAs supplied via a robust bilateral posterior communicating arteries. Both PCAs remain grossly perfused to their distal aspects. Venous sinuses: Patent. Anatomic variants: Fetal type origin of the PCAs. No visible aneurysm. Review of the MIP images confirms the above findings CT Brain Perfusion Findings: ASPECTS: Estimated at approximately 6 on prior CT. CBF (<30%) Volume: 10mL Perfusion (Tmax>6.0s) volume: 17mL Mismatch Volume: 69mL Infarction Location:Evidence for acute core infarct at the posterior right frontoparietal region, largely corresponding with the completed infarct as seen on prior MRI. This may have expanded partially in the interim. Evidence for associated subtle leptomeningeal enhancement on corresponding CTA, consistent with the subacute nature of this infarct. Surrounding ischemic penumbra of 57 cc, consider tissue at risk. IMPRESSION: 1. Abrupt occlusion of the right M1 segment, new as compared to prior CTA from 01/25/2021. There is suggestion of an underlying stenosis at this level on prior CTA from 01/25/2021. 2. Acute core infarct at the posterior right frontoparietal region, largely corresponding with the completed infarct as seen on prior MRI. This may have partially expanded in the interim. Surrounding ischemic penumbra of 57 cc, considered tissue at risk. 3. Fetal type origin of the PCAs with overall diminutive vertebrobasilar system. 4. Otherwise gross patency of the major arterial vasculature of the head and neck,  although evaluation  somewhat technically limited by timing of the contrast bolus. Critical Value/emergent results were discussed by telephone at the time of interpretation on 02/02/2021 at 8:36 p.m. to provider [facility] , who verbally acknowledged these results. Electronically Signed   By: Jeannine Boga M.D.   On: 02/02/2021 21:35   CT HEAD CODE STROKE WO CONTRAST  Result Date: 02/02/2021 CLINICAL DATA:  Code stroke.  Left-sided weakness EXAM: CT HEAD WITHOUT CONTRAST TECHNIQUE: Contiguous axial images were obtained from the base of the skull through the vertex without intravenous contrast. COMPARISON:  01/25/2021 FINDINGS: Brain: No acute intracranial hemorrhage. Suspect new loss of gray-white differentiation in the right frontoparietal lobes posterior to the chronic frontal infarct with involvement of lateral precentral and postcentral gyri. Evolution of recent prior infarction involving the right parietal and temporal lobes. Small chronic left cerebellar infarcts are again seen. Stable findings of probable chronic microvascular ischemic changes. No hydrocephalus or extra-axial collection. Vascular: No hyperdense vessel. Skull: Unremarkable. Sinuses/Orbits: No acute abnormality. Other: None. IMPRESSION: No acute intracranial hemorrhage. Suspect acute infarction of the right frontoparietal lobes posterosuperior to chronic infarct with probable involvement of lateral precentral and postcentral gyri. These results were called by telephone at the time of interpretation on 02/02/2021 at 7:57 pm to provider Fredia Sorrow , who verbally acknowledged these results. Electronically Signed   By: Macy Mis M.D.   On: 02/02/2021 20:00   CT ANGIO HEAD CODE STROKE  Result Date: 02/02/2021 CLINICAL DATA:  Initial evaluation for acute stroke. EXAM: CT ANGIOGRAPHY HEAD AND NECK CT PERFUSION BRAIN TECHNIQUE: Multidetector CT imaging of the head and neck was performed using the standard protocol during bolus administration of  intravenous contrast. Multiplanar CT image reconstructions and MIPs were obtained to evaluate the vascular anatomy. Carotid stenosis measurements (when applicable) are obtained utilizing NASCET criteria, using the distal internal carotid diameter as the denominator. Multiphase CT imaging of the brain was performed following IV bolus contrast injection. Subsequent parametric perfusion maps were calculated using RAPID software. CONTRAST:  159mL OMNIPAQUE IOHEXOL 350 MG/ML SOLN COMPARISON:  Prior CT from earlier the same day as well as recent MRI from 01/26/2021 and CTA from 01/25/2021. FINDINGS: CTA NECK FINDINGS Aortic arch: Visualized aortic arch normal in caliber with normal 3 vessel morphology. No hemodynamically significant stenosis about the origin of the great vessels. Right carotid system: Right common and internal carotid arteries widely patent without stenosis, dissection or occlusion. Mild atheromatous change about the origin of the right ICA without significant stenosis. Left carotid system: Left common and internal carotid arteries widely patent without stenosis, dissection or occlusion. Mild atheromatous change about the proximal left ICA without significant stenosis. Vertebral arteries: Vertebral arteries are diminutive and poorly visualized within the neck, but are grossly patent without stenosis or other abnormality. Skeleton: No visible acute osseous finding. No discrete or worrisome osseous lesions. Other neck: No other acute soft tissue abnormality within the neck. 1.7 cm sebaceous cyst noted at the right submandibular region. Bilateral thyroid nodules noted, recently evaluated on dedicated thyroid ultrasound. No other mass or adenopathy. Upper chest: Visualized upper chest demonstrates no acute finding. Engorgement of the azygos vein noted. Review of the MIP images confirms the above findings CTA HEAD FINDINGS Anterior circulation: Evaluation technically limited by timing the contrast bolus with  poor opacification of the intracranial arterial circulation. Both internal carotid arteries remain patent to the termini without stenosis. A1 segments patent. Grossly normal anterior communicating artery complex. Anterior cerebral arteries remain grossly patent to their  distal aspects. Left M1 patent. Grossly normal left MCA bifurcation. No visible proximal left M2 branch occlusion. Distal left MCA branches perfused. There now appears to have developed in occlusion of the right M1 segment (series 4, image 119). There was suggestion of an underlying stenosis at this level on prior CTA. Minimal scant flow seen distally, which could be collateral in nature. An adjacent right temporal branch appears to remain patent. Posterior circulation: Both V4 segments are grossly patent to the vertebrobasilar junction without appreciable stenosis. Left PICA grossly patent. Right PICA not seen. Basilar markedly diminutive but grossly patent to its distal aspect. Superior cerebellar arteries appear patent proximally. Fetal type origin of the PCAs supplied via a robust bilateral posterior communicating arteries. Both PCAs remain grossly perfused to their distal aspects. Venous sinuses: Patent. Anatomic variants: Fetal type origin of the PCAs. No visible aneurysm. Review of the MIP images confirms the above findings CT Brain Perfusion Findings: ASPECTS: Estimated at approximately 6 on prior CT. CBF (<30%) Volume: 6mL Perfusion (Tmax>6.0s) volume: 19mL Mismatch Volume: 42mL Infarction Location:Evidence for acute core infarct at the posterior right frontoparietal region, largely corresponding with the completed infarct as seen on prior MRI. This may have expanded partially in the interim. Evidence for associated subtle leptomeningeal enhancement on corresponding CTA, consistent with the subacute nature of this infarct. Surrounding ischemic penumbra of 57 cc, consider tissue at risk. IMPRESSION: 1. Abrupt occlusion of the right M1  segment, new as compared to prior CTA from 01/25/2021. There is suggestion of an underlying stenosis at this level on prior CTA from 01/25/2021. 2. Acute core infarct at the posterior right frontoparietal region, largely corresponding with the completed infarct as seen on prior MRI. This may have partially expanded in the interim. Surrounding ischemic penumbra of 57 cc, considered tissue at risk. 3. Fetal type origin of the PCAs with overall diminutive vertebrobasilar system. 4. Otherwise gross patency of the major arterial vasculature of the head and neck, although evaluation somewhat technically limited by timing of the contrast bolus. Critical Value/emergent results were discussed by telephone at the time of interpretation on 02/02/2021 at 8:36 p.m. to provider [facility] , who verbally acknowledged these results. Electronically Signed   By: Jeannine Boga M.D.   On: 02/02/2021 21:35   CT ANGIO NECK CODE STROKE  Result Date: 02/02/2021 CLINICAL DATA:  Initial evaluation for acute stroke. EXAM: CT ANGIOGRAPHY HEAD AND NECK CT PERFUSION BRAIN TECHNIQUE: Multidetector CT imaging of the head and neck was performed using the standard protocol during bolus administration of intravenous contrast. Multiplanar CT image reconstructions and MIPs were obtained to evaluate the vascular anatomy. Carotid stenosis measurements (when applicable) are obtained utilizing NASCET criteria, using the distal internal carotid diameter as the denominator. Multiphase CT imaging of the brain was performed following IV bolus contrast injection. Subsequent parametric perfusion maps were calculated using RAPID software. CONTRAST:  13mL OMNIPAQUE IOHEXOL 350 MG/ML SOLN COMPARISON:  Prior CT from earlier the same day as well as recent MRI from 01/26/2021 and CTA from 01/25/2021. FINDINGS: CTA NECK FINDINGS Aortic arch: Visualized aortic arch normal in caliber with normal 3 vessel morphology. No hemodynamically significant stenosis  about the origin of the great vessels. Right carotid system: Right common and internal carotid arteries widely patent without stenosis, dissection or occlusion. Mild atheromatous change about the origin of the right ICA without significant stenosis. Left carotid system: Left common and internal carotid arteries widely patent without stenosis, dissection or occlusion. Mild atheromatous change about the proximal left  ICA without significant stenosis. Vertebral arteries: Vertebral arteries are diminutive and poorly visualized within the neck, but are grossly patent without stenosis or other abnormality. Skeleton: No visible acute osseous finding. No discrete or worrisome osseous lesions. Other neck: No other acute soft tissue abnormality within the neck. 1.7 cm sebaceous cyst noted at the right submandibular region. Bilateral thyroid nodules noted, recently evaluated on dedicated thyroid ultrasound. No other mass or adenopathy. Upper chest: Visualized upper chest demonstrates no acute finding. Engorgement of the azygos vein noted. Review of the MIP images confirms the above findings CTA HEAD FINDINGS Anterior circulation: Evaluation technically limited by timing the contrast bolus with poor opacification of the intracranial arterial circulation. Both internal carotid arteries remain patent to the termini without stenosis. A1 segments patent. Grossly normal anterior communicating artery complex. Anterior cerebral arteries remain grossly patent to their distal aspects. Left M1 patent. Grossly normal left MCA bifurcation. No visible proximal left M2 branch occlusion. Distal left MCA branches perfused. There now appears to have developed in occlusion of the right M1 segment (series 4, image 119). There was suggestion of an underlying stenosis at this level on prior CTA. Minimal scant flow seen distally, which could be collateral in nature. An adjacent right temporal branch appears to remain patent. Posterior circulation:  Both V4 segments are grossly patent to the vertebrobasilar junction without appreciable stenosis. Left PICA grossly patent. Right PICA not seen. Basilar markedly diminutive but grossly patent to its distal aspect. Superior cerebellar arteries appear patent proximally. Fetal type origin of the PCAs supplied via a robust bilateral posterior communicating arteries. Both PCAs remain grossly perfused to their distal aspects. Venous sinuses: Patent. Anatomic variants: Fetal type origin of the PCAs. No visible aneurysm. Review of the MIP images confirms the above findings CT Brain Perfusion Findings: ASPECTS: Estimated at approximately 6 on prior CT. CBF (<30%) Volume: 63mL Perfusion (Tmax>6.0s) volume: 150mL Mismatch Volume: 60mL Infarction Location:Evidence for acute core infarct at the posterior right frontoparietal region, largely corresponding with the completed infarct as seen on prior MRI. This may have expanded partially in the interim. Evidence for associated subtle leptomeningeal enhancement on corresponding CTA, consistent with the subacute nature of this infarct. Surrounding ischemic penumbra of 57 cc, consider tissue at risk. IMPRESSION: 1. Abrupt occlusion of the right M1 segment, new as compared to prior CTA from 01/25/2021. There is suggestion of an underlying stenosis at this level on prior CTA from 01/25/2021. 2. Acute core infarct at the posterior right frontoparietal region, largely corresponding with the completed infarct as seen on prior MRI. This may have partially expanded in the interim. Surrounding ischemic penumbra of 57 cc, considered tissue at risk. 3. Fetal type origin of the PCAs with overall diminutive vertebrobasilar system. 4. Otherwise gross patency of the major arterial vasculature of the head and neck, although evaluation somewhat technically limited by timing of the contrast bolus. Critical Value/emergent results were discussed by telephone at the time of interpretation on 02/02/2021 at  8:36 p.m. to provider [facility] , who verbally acknowledged these results. Electronically Signed   By: Jeannine Boga M.D.   On: 02/02/2021 21:35    Labs:  CBC: Recent Labs    01/26/21 0242 01/27/21 0202 02/02/21 1951 02/03/21 0222  WBC 5.0 5.4 6.2 6.2  HGB 13.6 13.1 13.3 13.5  HCT 40.2 38.0* 39.8 39.7  PLT 198 198 188 188    COAGS: Recent Labs    11/29/20 0654 01/25/21 1916 01/26/21 0242 02/02/21 1951  INR 1.4* 1.1 1.1  1.1  APTT 34 32 31 31    BMP: Recent Labs    12/19/20 0915 01/21/21 1758 01/26/21 0242 01/27/21 0202 02/02/21 1951 02/03/21 0222  NA 141   < > 139 139 140 137  K 3.6   < > 3.6 3.3* 3.9 3.7  CL 106   < > 101 102 105 106  CO2 27   < > 27 28 26  21*  GLUCOSE 113*   < > 107* 122* 123* 146*  BUN 14   < > 8 12 16 14   CALCIUM 9.7   < > 9.5 9.6 9.3 9.1  CREATININE 2.01*   < > 1.48* 1.51* 1.83* 1.72*  GFRNONAA 36*   < > 55* 53* 42* 46*  GFRAA 41*  --   --   --   --   --    < > = values in this interval not displayed.    LIVER FUNCTION TESTS: Recent Labs    01/26/21 0242 01/27/21 0202 02/02/21 1951 02/03/21 0222  BILITOT 1.0 1.1 0.8 0.6  AST 25 25 33 30  ALT 24 23 34 27  ALKPHOS 95 93 100 100  PROT 6.9 7.2 7.9 6.9  ALBUMIN 3.7 3.8 4.4 3.8    Assessment and Plan:  History of acute CVA s/p cerebral arteriogram with emergent mechanical thrombectomy of right MCA M1 occlusion achieving a TICI 2c revascularization via right femoral approach 02/02/2021 by Dr. Estanislado Pandy. Patient's condition stable- awake and alert, follows simple commands, can spontaneously move right side, no movements of left side, left facial droop, left eye visual field deficit. Right femoral puncture site stable, distal pulses (DPs) 1+ bilaterally. Further plans per neurology- appreciate and agree with management. Please call NIR with questions/concerns.   Electronically Signed: Earley Abide, PA-C 02/04/2021, 10:09 AM   I spent a total of 15 Minutes at the  the patient's bedside AND on the patient's hospital floor or unit, greater than 50% of which was counseling/coordinating care for CVA s/p revascularization.

## 2021-02-04 NOTE — Progress Notes (Signed)
Stat  EEG complete - results pending.  

## 2021-02-04 NOTE — Progress Notes (Signed)
Bilateral lower extremity venous duplex has been completed. Preliminary results can be found in CV Proc through chart review.  Results were given to the patient's nurse, Claiborne Billings.  02/04/21 11:17 AM Matthew Wise RVT

## 2021-02-04 NOTE — Progress Notes (Signed)
PT Cancellation Note  Patient Details Name: Matthew Wise MRN: [id number] DOB: 01-25-1961   Cancelled Treatment:    Reason Eval/Treat Not Completed: Medical issues which prohibited therapy;Patient not medically ready.  Holding today, checking for DVT. 02/04/2021  Matthew Carne., PT Acute Rehabilitation Services [phone]  (pager) [phone]  (office)   Matthew Wise 02/04/2021, 11:27 AM

## 2021-02-04 NOTE — Progress Notes (Signed)
Bilateral lower extremity DVTs age-indeterminate on lower extremity Dopplers preliminary report. We will start heparin stroke protocol.   -- Amie Portland, MD Stroke Neurology Pager: [phone]

## 2021-02-04 NOTE — Progress Notes (Signed)
ANTICOAGULATION CONSULT NOTE - Initial Consult  Pharmacy Consult:  Heparin Indication:  VTE treatment  No Known Allergies  Patient Measurements: Weight: 116.7 kg (257 lb 4.4 oz) Heparin Dosing Weight: 105 kg  Vital Signs: Temp: 98.3 F (36.8 C) (03/08 1200) Temp Source: Oral (03/08 1200) BP: 143/72 (03/08 1500) Pulse Rate: 59 (03/08 1500)  Labs: Recent Labs    02/02/21 1951 02/03/21 0222 02/04/21 0930  HGB 13.3 13.5 12.8*  HCT 39.8 39.7 36.1*  PLT 188 188 194  APTT 31  --   --   LABPROT 13.8  --   --   INR 1.1  --   --   CREATININE 1.83* 1.72* 1.64*    Estimated Creatinine Clearance: 65.7 mL/min (A) (by C-G formula based on SCr of 1.64 mg/dL (H)).   Medical History: Past Medical History:  Diagnosis Date  . Acute CVA (cerebrovascular accident) ([place]) 09/09/2020  . Chronic anticoagulation 11/14/2014  . CKD (chronic kidney disease) stage 3, GFR 30-59 ml/min (HCC) 10/24/2019  . CKD (chronic kidney disease) stage 3, GFR 30-59 ml/min (HCC) 10/24/2019  . Clotting disorder ([place])   . 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 DVT (deep venous thrombosis) ([place]) 06/15/2011  . DVT of leg (deep venous thrombosis) (HCC)    rt. leg  . Essential hypertension 11/28/2020  . H/O PE (pulmonary embolism) 06/15/2011  . Hyperlipidemia 11/28/2020  . 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 Prediabetes 10/24/2019  . Thyroid nodule 01/25/2021    Assessment: 13 YOM with history of CVA presented to [facility] with left arm/leg weakness.  Patient transferred to [facility] for revascularization of acute CVA on 02/03/21.  He was on Eliquis PTA for history of PE/DVT, last dose on 3/6 at 0830.  Doppler shows age indeterminate and chronic DVTs; therefore, Pharmacy consulted to start IV heparin.    Being conservative with initial dosing given acute CVA.  CBC stable.  Goal of Therapy:  Heparin level 0.3-0.5  units/ml aPTT 66-85 seconds Monitor platelets by anticoagulation protocol: Yes   Plan:  Heparin infusion at 1200 units/hr - no bolus with acute CVA Check 6 hr heparin level and aPTT Daily heparin level and CBC Order daily aPTT if needed  Chuong Casebeer D. Mina Marble, PharmD, BCPS, [place] 02/04/2021, 3:51 PM

## 2021-02-04 NOTE — Progress Notes (Addendum)
STROKE TEAM PROGRESS NOTE   INTERVAL HISTORY Seen and examined with RN at bedside. Awake, cooperative.  Vitals:   02/04/21 0630 02/04/21 0645 02/04/21 0700 02/04/21 0800  BP: 125/63 126/61 120/79   Pulse: 65 72 88   Resp: 14 14 19    Temp:    98.2 F (36.8 C)  TempSrc:    Oral  SpO2: 96% 97% 97%   Weight:       CBC    Component Value Date/Time   WBC 8.9 02/04/2021 0930   RBC 4.37 02/04/2021 0930   HGB 12.8 (L) 02/04/2021 0930   HCT 36.1 (L) 02/04/2021 0930   PLT 194 02/04/2021 0930   MCV 82.6 02/04/2021 0930   MCH 29.3 02/04/2021 0930   MCHC 35.5 02/04/2021 0930   RDW 13.6 02/04/2021 0930   LYMPHSABS 1.4 02/04/2021 0930   MONOABS 0.9 02/04/2021 0930   EOSABS 0.1 02/04/2021 0930   BASOSABS 0.0 02/04/2021 0930   CMP Latest Ref Rng & Units 02/04/2021 02/03/2021 02/02/2021  Glucose 70 - 99 mg/dL 121(H) 146(H) 123(H)  BUN 6 - 20 mg/dL 9 14 16   Creatinine 0.61 - 1.24 mg/dL 1.64(H) 1.72(H) 1.83(H)  Sodium 135 - 145 mmol/L 138 137 140  Potassium 3.5 - 5.1 mmol/L 3.5 3.7 3.9  Chloride 98 - 111 mmol/L 107 106 105  CO2 22 - 32 mmol/L 22 21(L) 26  Calcium 8.9 - 10.3 mg/dL 9.1 9.1 9.3  Total Protein 6.5 - 8.1 g/dL 6.8 6.9 7.9  Total Bilirubin 0.3 - 1.2 mg/dL 0.5 0.6 0.8  Alkaline Phos 38 - 126 U/L 83 100 100  AST 15 - 41 U/L 33 30 33  ALT 0 - 44 U/L 23 27 34     IMAGING past 24 hours CT HEAD WO CONTRAST  Result Date: 02/03/2021 CLINICAL DATA:  Status post revascularization of right M1 occlusion with right MCA infarct and ischemia. EXAM: CT HEAD WITHOUT CONTRAST TECHNIQUE: Contiguous axial images were obtained from the base of the skull through the vertex without intravenous contrast. COMPARISON:  CT head without contrast 02/02/2021. CT perfusion and CTA head and neck 02/02/2021. MR head without contrast 01/26/2021. FINDINGS: Brain: Progressive cortical infarct is present in the posterior right MCA territory with further loss of gray-white differentiation. Remote infarct in the  anterior right MCA territory is stable. Progressive edema and mass effect is present with effacement the sulci on the right. Hyperdense areas in the sulci likely reflect pseudo subarachnoid hemorrhage with areas of cortex adjacent to one another. No definite hemorrhage is present. There is some mass effect with 2-3 mm of midline shift. Partial effacement of the right lateral ventricle is noted. Left hemisphere is stable. Brainstem and cerebellum are unremarkable. Vascular: No hyperdense vessel or unexpected calcification. Skull: Calvarium is intact. No focal lytic or blastic lesions are present. No significant extracranial soft tissue lesion is present. Sinuses/Orbits: A polyp or mucous retention cyst in the right maxillary sinus is stable. The paranasal sinuses and mastoid air cells are otherwise clear. The globes and orbits are within normal limits. IMPRESSION: 1. Progressive nonhemorrhagic cortical infarct in the posterior right MCA territory with further loss of gray-white differentiation. 2. Progressive edema and mass effect with 2-3 mm of midline shift. 3. Hyperdense areas in the sulci likely reflect pseudo subarachnoid hemorrhage with areas of cortex adjacent to one another. No definite hemorrhage is present. 4. Stable remote infarct of the anterior right MCA territory. Electronically Signed   By: San Morelle M.D.   On:  02/03/2021 09:22   MR BRAIN WO CONTRAST  Result Date: 02/03/2021 CLINICAL DATA:  Stroke follow-up. Status post revascularization of right M1 occlusion. EXAM: MRI HEAD WITHOUT CONTRAST TECHNIQUE: Multiplanar, multiecho pulse sequences of the brain and surrounding structures were obtained without intravenous contrast. COMPARISON:  Head CT 02/03/2021 and MRI 01/26/2021 FINDINGS: Brain: A large acute right MCA territory infarct is again noted involving portions of the frontal, parietal, temporal, and superolateral occipital lobes as well as basal ganglia and insula, larger than that on  the prior MRI. There is associated cytotoxic edema with sulcal effacement, partial effacement of the right lateral ventricle, and trace leftward midline shift, unchanged from today's earlier CT. There is associated petechial hemorrhage. A chronic infarct is noted more anteriorly in the right frontal lobe and insula with chronic blood products. An 8 mm acute left cerebellar infarct is new from the prior MRI. There are also new punctate acute to early subacute infarcts scattered in the left occipital, left parietal, and left frontal lobes. Small subacute infarcts are again seen in the pons. There also chronic bilateral cerebellar infarcts. Scattered small foci of T2 hyperintensity in the cerebral white matter bilaterally are unchanged from the prior MRI and are nonspecific but compatible with mild chronic small vessel ischemic disease. There is no age advanced cerebral atrophy. There is no extra-axial fluid collection. Vascular: Major intracranial vascular flow voids are grossly preserved with a hypoplastic vertebrobasilar circulation. Skull and upper cervical spine: Unremarkable bone marrow signal. Sinuses/Orbits: Unremarkable orbits. Mild mucosal thickening and small mucous retention cysts in the right maxillary sinus. Clear mastoid air cells. Other: None. IMPRESSION: 1. Large acute right MCA territory infarct with petechial hemorrhage and minimal leftward midline shift, unchanged from today's CT. 2. Small acute left cerebellar infarct. 3. New small acute to early subacute infarcts in the left occipital, left parietal, and left frontal lobes. Electronically Signed   By: Logan Bores M.D.   On: 02/03/2021 18:52    PHYSICAL EXAM General: Awake alert in no distress HEENT: Normocephalic atraumatic CVs: Regular rate rhythm Respiratory: Breathing well saturating normally on room air Extremities: Warm well perfused with left LE swelling and venous stasis ulcers-appears somewhat increased in size since last  day. Neurological exam Awake alert oriented x3 Slightly hypophonic No evidence of aphasia No dysarthria Cranial nerves: Pupils equal round react light, extraocular movements intact, visual fields appear full, face appears symmetric, facial sensation appears intact. [facility] Motor exam: Left upper extremity is barely 1/5.  Left lower extremity is 1-2/5.  Right upper and lower extremity. Sensory exam: Diminished on the left, also extinguishes on the left to double simultaneous stimulation. Coordination: No dysmetria on the right.  Unable to perform on the left Gait testing deferred   ASSESSMENT/PLAN Mr. DEMARRIO MENGES is a 60 y.o. male with history of hypertension, hyperlipidemia, DVT/PE on Eliquis, sickle cell disease presenting with sudden onset of left arm and leg weakness along with left facial droop with a last known well of 1500 hrs. on 02/02/2021.  Seen by telemedicine neurology and transported to [facility] for higher level of care due to right MCA M1 filling deficit and perfusion deficit on perfusion imaging-see details below.  Patient was seen in December/January as well as again in February for right MCA strokes-first 1 in the right M1 superior division and the second 1 involving patchy distribution of both superior and inferior division of the right MCAs.  This time around, CTA showed no abrupt occlusion of the right M1 segment  which is new as compared to the prior CTA from February 2022 suggesting underlying stenosis on the prior CTA that probably now occluded. There was an acute core infarct in the posterior right frontoparietal region likely corresponding with the completed infarct as seen on prior MRI but there was presumably penumbra of 57 cc considered tissue at risk for which he was taken for emergent IR thrombectomy.  MRI yesterday shows large acute RMCA infarct with peticheal hemorrhage, minimal leftward shift, small acute left cerebellar infarct, new small acute/subacute  left occipital, left parietal and left frontal lobe infarcts.  Could be periprocedural but very suspicious for a cardioembolic etiology.  Stroke-likely atheroembolic from large vessel arterial sclerosis  Code Stroke CT head no acute ICH.  Suspect acute infarction of the right frontoparietal lobes posterior superior to chronic infarct with probable involvement of lateral precentral and postcentral gyri.  CTA head & neck-abrupt right M1 segment occlusion new as compared to CTA from 01/25/2021.  This is suggestive of an underlying stenosis at this level on prior CTA from 01/25/2021.  Acute cord infarct at the posterior right frontoparietal region likely corresponding with the completed infarct as seen on prior MRI.  This may have partially expanded in the interim.  Surrounding ischemic penumbra 57 cc-consider tissue address.  Fetal PCA bilaterally and overall diminutive vertebrobasilar system.  Technically limited by contrast bolus  CT perfusion -57 cc penumbra-in hindsight likely overestimating.  CT head after the procedure 9 AM today-progressive nonhemorrhagic cortical infarct in the posterior right MCA territory with further loss of gray-white differentiation.  Progressive edema and mass-effect with 2 to 3 mm midline shift.  Hyperdense areas in the sulci likely reflective of pseudosubarachnoid hemorrhage with areas of cortex adjacent to 1 another.  No definitive hemorrhage is present.  Stable remote infarct of the anterior right MCA territory.  MRI yesterday shows large acute RMCA infarct with peticheal hemorrhage, minimal leftward shift, small acute left cerebellar infarct, new small acute/subacute left occipital, left parietal and left frontal lobe infarcts.  Could be periprocedural but very suspicious for a cardioembolic etiology-also due to increased LA size seen on TEE.  2D Echo completed on 01/22/2021 with LVEF 55 to 60%, left ventricle with regional wall motion abnormalities.  Left ventricular  hypertrophy-mild.  Diastolic parameters of the left ventricle indeterminate.  Right ventricular systolic function normal.  Mitral valve normal.  Aortic valve is tricuspid and normal.  Left atrium normal.  TEE 08/2020 - no shunt, no appendage clot, no LV thrombus, mild dilated LA size   LDL 25  HgbA1c 5.9  VTE prophylaxis -SCDs for now.  May start chemical prophylaxis after MRI is completed and shows no large bleed.    Diet   DIET DYS 3 Room service appropriate? Yes; Fluid consistency: Nectar Thick    Eliquis (apixaban) daily prior to admission, now on No antithrombotic.  Start low-dose aspirin. Anticoagulation will have to be at least a week or more after the stroke given the large size as well as the peticheal blood.  Therapy recommendations: Pending  Disposition: Pending  Hypertension, Hypertensive urgency  Home meds: Amlodipine 5 mg. Increase to 10. Continue Lisinopril 10. Attempt to wean cleviprex.  Unstable-on Cleviprex.  Also on labetalol and hydralazine PRN . SBP <160 for today . Consider transfer out of the ICU, if he is stable off of cleviprex . Long-term BP goal normotensive  Hyperlipidemia  Home meds:  Lipitor 40  LDL 25, goal < 70  Will reduce to Lipitor 20  Continue  statin at discharge  Leg swelling -h/o DVT -get LE dopplers  Other Stroke Risk Factors  Cigarette smoker advised to stop smoking  Obesity, Body mass index is 33.94 kg/m., BMI >/= 30 associated with increased stroke risk, recommend weight loss, diet and exercise as appropriate   Hx stroke/TIA  OSA   Other risk factors  HgbA1c 5.9, goal < 7.0  Hospital day # 2  Other Problems CKD 3   -- Amie Portland, MD Stroke Neurology Pager: [place] Performed by: Amie Portland, MD Total critical care time: 35 minutes Critical care time was exclusive of separately billable procedures and treating other patients and/or supervising  APPs/Residents/Students Critical care was necessary to treat or prevent imminent or life-threatening deterioration due to acute ischemic stroke status post mechanical thrombectomy This patient is critically ill and at significant risk for neurological worsening and/or death and care requires constant monitoring. Critical care was time spent personally by me on the following activities: development of treatment plan with patient and/or surrogate as well as nursing, discussions with consultants, evaluation of patient's response to treatment, examination of patient, obtaining history from patient or surrogate, ordering and performing treatments and interventions, ordering and review of laboratory studies, ordering and review of radiographic studies, pulse oximetry, re-evaluation of patient's condition, participation in multidisciplinary rounds and medical decision making of high complexity in the care of this patient.    To contact Stroke Continuity provider, please refer to [email or web address] After hours, contact General Neurology   Addendum for CDI query Cerebral edema-as expected.  Continue to monitor.  If shows worsening on imaging or if clinical exam worsens, will require treatment with hypertonic saline.

## 2021-02-04 NOTE — Progress Notes (Signed)
Paged by RN regarding new onset of RUE twitching. Noted at 7:02 during a nursing check and stopped by 7:03. Last seen normal at about 6:30 PM. STAT EEG has been ordered.   Electronically signed: Dr. Kerney Elbe

## 2021-02-05 ENCOUNTER — Ambulatory Visit (INDEPENDENT_AMBULATORY_CARE_PROVIDER_SITE_OTHER): Payer: Medicare Other | Admitting: Nurse Practitioner

## 2021-02-05 DIAGNOSIS — R569 Unspecified convulsions: Secondary | ICD-10-CM | POA: Diagnosis not present

## 2021-02-05 DIAGNOSIS — I639 Cerebral infarction, unspecified: Secondary | ICD-10-CM | POA: Diagnosis not present

## 2021-02-05 LAB — SODIUM
Sodium: 139 mmol/L (ref 135–145)
Sodium: 140 mmol/L (ref 135–145)
Sodium: 140 mmol/L (ref 135–145)
Sodium: 137 mmol/L (ref 135–145)

## 2021-02-05 LAB — HEPARIN LEVEL (UNFRACTIONATED): Heparin Unfractionated: 0.92 IU/mL — ABNORMAL HIGH (ref 0.30–0.70)

## 2021-02-05 LAB — APTT
aPTT: 50 seconds — ABNORMAL HIGH (ref 24–36)
aPTT: 62 seconds — ABNORMAL HIGH (ref 24–36)

## 2021-02-05 MED ORDER — LABETALOL HCL 5 MG/ML IV SOLN
10.0000 mg | INTRAVENOUS | Status: DC | PRN
  Administered 2021-02-05 – 2021-02-07 (×6): 10 mg via INTRAVENOUS
  Filled 2021-02-05 (×6): qty 4

## 2021-02-05 MED ORDER — LISINOPRIL 20 MG PO TABS: 20.0000 mg | ORAL_TABLET | Freq: Every day | ORAL | Status: DC

## 2021-02-05 MED ORDER — SODIUM CHLORIDE 3 % IV SOLN
INTRAVENOUS | Status: DC
  Filled 2021-02-05 (×8): qty 500

## 2021-02-05 MED ORDER — LEVETIRACETAM 500 MG PO TABS
500.0000 mg | ORAL_TABLET | Freq: Two times a day (BID) | ORAL | Status: DC
  Administered 2021-02-05 – 2021-02-12 (×15): 500 mg via ORAL
  Filled 2021-02-05 (×15): qty 1

## 2021-02-05 MED ORDER — HEPARIN (PORCINE) 25000 UT/250ML-% IV SOLN
1750.0000 [IU]/h | INTRAVENOUS | Status: DC
  Administered 2021-02-06: 1750 [IU]/h via INTRAVENOUS
  Filled 2021-02-05 (×3): qty 250

## 2021-02-05 MED ORDER — HYDRALAZINE HCL 20 MG/ML IJ SOLN
10.0000 mg | INTRAMUSCULAR | Status: DC | PRN
  Administered 2021-02-05 – 2021-02-08 (×5): 10 mg via INTRAVENOUS
  Filled 2021-02-05 (×5): qty 1

## 2021-02-05 MED ORDER — SODIUM CHLORIDE 0.9 % IV SOLN
3000.0000 mg | INTRAVENOUS | Status: AC
  Administered 2021-02-05: 3000 mg via INTRAVENOUS
  Filled 2021-02-05: qty 30

## 2021-02-05 NOTE — Procedures (Addendum)
Patient Name: Matthew Wise  MRN: [id number]  Epilepsy Attending: Lora Havens  Referring Physician/Provider: Dr Kerney Elbe Duration: 02/04/2021 2251 to 02/05/2021 2251   Patient history: 60 year old male with right upper extremity twitching.  EEG to evaluate for seizures.   Level of alertness: Awake, asleep  AEDs during EEG study: None  Technical aspects: This EEG study was done with scalp electrodes positioned according to the 10-20 International system of electrode placement. Electrical activity was acquired at a sampling rate of 500Hz  and reviewed with a high frequency filter of 70Hz  and a low frequency filter of 1Hz . EEG data were recorded continuously and digitally stored.   Description: The posterior dominant rhythm consists of 9 Hz activity of moderate voltage (25-35 uV) seen predominantly in posterior head regions, symmetric and reactive to eye opening and eye closing. Sleep was characterized by vertex waves, sleep spindles (12-14hz ), maximal frontocentral region. EEG showed continuous 3-5hz  theta-delta slowing in right frontotemporal region.  Event button was pressed on 02/05/2021 at 0213 for left face and arm twitching. Concomitant EEG showed sharp waves in right posterior quadrant which gradually evolved in frequency and spread to involve left posterior quadrant.  Two more seizures were noted without definite clinical signs arising from right posterior quadrant on 02/04/2021 2343, on 02/05/2021 0021.  ABNORMALITY - Focal seizure, right posterior quadrant - Continuous slow, right frontotemporal region  IMPRESSION: This study showed evidence of focal seizure arising from right posterior quadrant during which patient was noted to have left upper extremity twitching as described above. Additionally there was cortical dysfunction in right frontotemporal region likely due to underlying stroke.   Cailean Heacock Barbra Sarks

## 2021-02-05 NOTE — Progress Notes (Signed)
LTM EEG hooked up and running - no initial skin breakdown - push button tested - neuro notified. Atrium monitored 

## 2021-02-05 NOTE — Progress Notes (Signed)
LTM Maintenance completed; reprepped many leads, no skin breakdown was seen; added cap.

## 2021-02-05 NOTE — Progress Notes (Signed)
OT Cancellation Note  Patient Details Name: Matthew Wise MRN: [id number] DOB: 12-31-1960   Cancelled Treatment:    Reason Eval/Treat Not Completed: Active bedrest order;Patient not medically ready.  Pt receiving hypertonic saline, has strict bedrest orders, and has newly diagnosed DVT with Heparin initiated yesterday afternoon.  Will initiate OT eval once activity advanced and pt medically stable.   Nilsa Nutting., OTR/L Acute Rehabilitation Services Pager [phone] Office [phone]   Lucille Passy M 02/05/2021, 9:21 AM

## 2021-02-05 NOTE — Progress Notes (Signed)
EEG was compromised by high impedance at the lectrode/scalp interface. In this context no definite seizures were seen per communication with Dr. Hortense Ramal.   Repeat CT head shows increased size of large right MCA territory infarct with unchanged subarachnoid hemorrhage. There is increased mass effect on the right lateral ventricle and 3 mm of left midline shift. A small left cerebellar infarct is also noted.   Hypertonic saline at 50 cc per hour has been started. NS infusion has been stopped.   Electronically signed: Dr. Kerney Elbe

## 2021-02-05 NOTE — Progress Notes (Signed)
PT Cancellation Note  Patient Details Name: Matthew Wise MRN: [id number] DOB: [birth year]   Cancelled Treatment:    Reason Eval/Treat Not Completed: Medical issues which prohibited therapy;Active bedrest order. Pt found to have DVTs. Heparin started 3/8 at 15:19. Per PT protocol, holding PT until > 24 hours after start of heparin. Also, active bedrest orders. Will follow-up as able.  Moishe Spice, PT, DPT Acute Rehabilitation Services  Pager: [phone] Office: [place] 02/05/2021, 7:41 AM

## 2021-02-05 NOTE — Progress Notes (Signed)
ANTICOAGULATION CONSULT NOTE   Pharmacy Consult:  Heparin Indication:  VTE treatment  Heparin dosing weight: 104.9 kg  Labs: Recent Labs    02/02/21 1951 02/03/21 0222 02/04/21 0930 02/04/21 2301 02/05/21 0309 02/05/21 0655  HGB 13.3 13.5 12.8*  --   --   --   HCT 39.8 39.7 36.1*  --   --   --   PLT 188 188 194  --   --   --   APTT 31  --   --  43*  --  50*  LABPROT 13.8  --   --   --   --   --   INR 1.1  --   --   --   --   --   HEPARINUNFRC  --   --   --  0.87* 0.92*  --   CREATININE 1.83* 1.72* 1.64*  --   --   --      Assessment: 50 YOM with history of CVA presented to [facility] with left arm/leg weakness. Patient transferred to [facility] for revascularization of acute CVA on 02/03/21. He was on Eliquis PTA for history of PE/DVT, last dose on 3/6 at 0830. 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 Pharmacy consulted to start IV heparin per stroke protocol.  aPTT remains subtherapeutic, increased to 50 after rate increase this AM. Heparin level remains elevated as expected due to influence of apixaban. Hg low stable 12.8, plt wnl (last 3/8). No bleeding or issues with infusion per discussion with RN.  Goal of Therapy:  Heparin level 0.3-0.5 units/ml aPTT 66-85 seconds Monitor platelets by anticoagulation protocol: Yes   Plan:  No bolus per protocol. Increase heparin infusion to 1600 units/hr Check 6 hr aPTT Monitor daily heparin level and CBC, s/sx bleeding   Arturo Morton, PharmD, BCPS Please check AMION for all [place] contact numbers Clinical Pharmacist 02/05/2021 8:52 AM

## 2021-02-05 NOTE — Procedures (Addendum)
Patient Name: TREYVIN GLIDDEN  MRN: [id number]  Epilepsy Attending: Lora Havens  Referring Physician/Provider: Dr Kerney Elbe Date: 02/04/2021  Duration: 25.15 mins  Patient history: 60 year old male with right upper extremity twitching.  EEG telemetry for seizures.  Level of alertness: Awake  AEDs during EEG study: None  Technical aspects: This EEG study was done with scalp electrodes positioned according to the 10-20 International system of electrode placement. Electrical activity was acquired at a sampling rate of 500Hz  and reviewed with a high frequency filter of 70Hz  and a low frequency filter of 1Hz . EEG data were recorded continuously and digitally stored.   Description: The posterior dominant rhythm consists of 9 Hz activity of moderate voltage (25-35 uV) seen predominantly in posterior head regions, symmetric and reactive to eye opening and eye closing. Hyperventilation and photic stimulation were not performed.     Patient was noted to have left upper extremity twitching intermittently throughout the study.  Concomitant EEG before, during and after the event did not show any EEG changes.  Of note, EEG was technically difficult due to significant myogenic artifact and high impedance.  IMPRESSION: This technically difficult study is within normal limits. Multiple episodes of left upper extremity twitching were noted without concomitant change.  However focal motor seizures may not be seen on scalp EEG.  Therefore clinical correlation recommended.   Melrose Kearse Barbra Sarks

## 2021-02-05 NOTE — Progress Notes (Signed)
ANTICOAGULATION CONSULT NOTE   Pharmacy Consult:  Heparin Indication:  VTE treatment  Heparin dosing weight: 104.9 kg  Labs: Recent Labs    02/02/21 1951 02/03/21 0222 02/04/21 0930 02/04/21 2301 02/05/21 0309 02/05/21 0655 02/05/21 1609  HGB 13.3 13.5 12.8*  --   --   --   --   HCT 39.8 39.7 36.1*  --   --   --   --   PLT 188 188 194  --   --   --   --   APTT 31  --   --  43*  --  50* 62*  LABPROT 13.8  --   --   --   --   --   --   INR 1.1  --   --   --   --   --   --   HEPARINUNFRC  --   --   --  0.87* 0.92*  --   --   CREATININE 1.83* 1.72* 1.64*  --   --   --   --      Assessment: 56 YOM with history of CVA presented to [facility] with left arm/leg weakness. Patient transferred to [facility] for revascularization of acute CVA on 02/03/21. He was on Eliquis PTA for history of PE/DVT, last dose on 3/6 at 0830.  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 Pharmacy consulted to dose IV heparin per stroke protocol.  aPTT remains subtherapeutic, increased to 62 sec after rate increase this AM.  No issue with heparin infusion nor bleeding per RN.  Goal of Therapy:  Heparin level 0.3-0.5 units/ml aPTT 66-85 seconds Monitor platelets by anticoagulation protocol: Yes   Plan:  No bolus per protocol.  Increase heparin infusion to 1750 units/hr Check 6 hr aPTT Monitor daily heparin level and CBC, s/sx bleeding  Lakasha Mcfall D. Mina Marble, PharmD, BCPS, [place] 02/05/2021, 6:03 PM

## 2021-02-05 NOTE — Progress Notes (Signed)
  Speech Language Pathology Treatment: Dysphagia;Cognitive-Linquistic  Patient Details Name: Matthew Wise MRN: [id number] DOB: 11-01-1961 Today's Date: 02/05/2021 Time: 1100-1108 SLP Time Calculation (min) (ACUTE ONLY): 8 min  Assessment / Plan / Recommendation Clinical Impression  Pt alert and upright, still with left neglect, apparent visuospatial deficits with limited awareness. Cues given to visually scan phone as pt is quite fixated on this. Pt passed [place], able to upgrade to thin liquids. Recommend ongoing mech soft diet given LUE weakness and mild oral weakness. Will f/u for cognition as pt comes off of sedating meds. Recommend CIR.   HPI HPI: Matthew Wise is a 60 y.o. 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 right posterior temporal and parietal lobe infarct consistent with an inferior division right MCA infarction.  Underwent RT common carotid arteriogram followed by revascularization of occluded Rt MCA      SLP Plan  Continue with current plan of care       Recommendations  Diet recommendations: Dysphagia 3 (mechanical soft);Thin liquid Liquids provided via: Cup;Straw Medication Administration: Whole meds with liquid Supervision: Patient able to self feed Compensations: Minimize environmental distractions Postural Changes and/or Swallow Maneuvers: Seated upright 90 degrees                General recommendations: Rehab consult Oral Care Recommendations: Oral care BID Follow up Recommendations: Inpatient Rehab Plan: Continue with current plan of care       GO               Herbie Baltimore, MA [place] Pager [phone] Office [phone]  Lynann [place] 02/05/2021, 11:30 AM

## 2021-02-05 NOTE — Progress Notes (Signed)
ANTICOAGULATION CONSULT NOTE   Pharmacy Consult:  Heparin Indication:  VTE treatment  Labs: Recent Labs    02/02/21 1951 02/03/21 0222 02/04/21 0930 02/04/21 2301  HGB 13.3 13.5 12.8*  --   HCT 39.8 39.7 36.1*  --   PLT 188 188 194  --   APTT 31  --   --  43*  LABPROT 13.8  --   --   --   INR 1.1  --   --   --   HEPARINUNFRC  --   --   --  0.87*  CREATININE 1.83* 1.72* 1.64*  --      Assessment: 22 YOM with history of CVA presented to [facility] with left arm/leg weakness.  Patient transferred to [facility] for revascularization of acute CVA on 02/03/21.  He was on Eliquis PTA for history of PE/DVT, last dose on 3/6 at 0830.  Doppler shows age indeterminate and chronic DVTs; therefore, Pharmacy consulted to start IV heparin.    Initial heparin level 0.87 units/ml,aPTT 43 sec  Goal of Therapy:  Heparin level 0.3-0.5 units/ml aPTT 66-85 seconds Monitor platelets by anticoagulation protocol: Yes   Plan:  Increase heparin infusion to 1400 units/hr Check 6 hr aPTT Daily heparin level, aPTT and CBC  Thanks for allowing pharmacy to be a part of this patient's care.  Excell Seltzer, PharmD Clinical Pharmacist 02/05/2021, 12:14 AM

## 2021-02-05 NOTE — Progress Notes (Signed)
STROKE TEAM PROGRESS NOTE   INTERVAL HISTORY RUE twitching overnight. Dr. Cheral Marker contacted. EEG and CT head obtained.  EEG was compromised by high impedance at the lectrode/scalp interface. In this context no definite seizures were seen per communication with Dr. Hortense Ramal.  Repeat CT head shows increased size of large right MCA territory infarct with unchanged subarachnoid hemorrhage. There is increased mass effect on the right lateral ventricle and 3 mm of left midline shift. A small left cerebellar infarct is also noted.  Hypertonic saline at 50 cc per hour was intitiated.  +bilat LE DVT on heparin gtt  Vitals:   02/05/21 0400 02/05/21 0500 02/05/21 0600 02/05/21 0700  BP: 140/90 (!) 146/98 (!) 148/82 (!) 142/92  Pulse: 64 62 81 (!) 59  Resp: 13 14 18 11   Temp: 99.9 F (37.7 C)     TempSrc: Oral     SpO2: 98% 96% 100% 99%  Weight:       CBC    Component Value Date/Time   WBC 8.9 02/04/2021 0930   RBC 4.37 02/04/2021 0930   HGB 12.8 (L) 02/04/2021 0930   HCT 36.1 (L) 02/04/2021 0930   PLT 194 02/04/2021 0930   MCV 82.6 02/04/2021 0930   MCH 29.3 02/04/2021 0930   MCHC 35.5 02/04/2021 0930   RDW 13.6 02/04/2021 0930   LYMPHSABS 1.4 02/04/2021 0930   MONOABS 0.9 02/04/2021 0930   EOSABS 0.1 02/04/2021 0930   BASOSABS 0.0 02/04/2021 0930   CMP Latest Ref Rng & Units 02/05/2021 02/04/2021 02/03/2021  Glucose 70 - 99 mg/dL - 121(H) 146(H)  BUN 6 - 20 mg/dL - 9 14  Creatinine 0.61 - 1.24 mg/dL - 1.64(H) 1.72(H)  Sodium 135 - 145 mmol/L 139 138 137  Potassium 3.5 - 5.1 mmol/L - 3.5 3.7  Chloride 98 - 111 mmol/L - 107 106  CO2 22 - 32 mmol/L - 22 21(L)  Calcium 8.9 - 10.3 mg/dL - 9.1 9.1  Total Protein 6.5 - 8.1 g/dL - 6.8 6.9  Total Bilirubin 0.3 - 1.2 mg/dL - 0.5 0.6  Alkaline Phos 38 - 126 U/L - 83 100  AST 15 - 41 U/L - 33 30  ALT 0 - 44 U/L - 23 27     IMAGING past 24 hours CT HEAD WO CONTRAST  Result Date: 02/04/2021 CLINICAL DATA:  Headache EXAM: CT HEAD WITHOUT  CONTRAST TECHNIQUE: Contiguous axial images were obtained from the base of the skull through the vertex without intravenous contrast. COMPARISON:  02/03/2021 FINDINGS: Brain: Large right MCA territory infarct with associated subarachnoid hemorrhage. The area of infarction has increased in size with greater temporal lobe involvement. No hydrocephalus. Increased mass effect on the right lateral ventricle. There is left midline shift of 3 mm. Small left cerebellar infarct. Vascular: No abnormal hyperdensity of the major intracranial arteries or dural venous sinuses. No intracranial atherosclerosis. Skull: The visualized skull base, calvarium and extracranial soft tissues are normal. Sinuses/Orbits: No fluid levels or advanced mucosal thickening of the visualized paranasal sinuses. No mastoid or middle ear effusion. The orbits are normal. IMPRESSION: 1. Increased size of large right MCA territory infarct with unchanged subarachnoid hemorrhage. 2. Increased mass effect on the right lateral ventricle and 3 mm of left midline shift. 3. Small left cerebellar infarct. Electronically Signed   By: Ulyses Jarred M.D.   On: 02/04/2021 22:56   VAS [place] LOWER EXTREMITY VENOUS (DVT)  Result Date: 02/04/2021  Lower Venous DVT Study Indications: Swelling.  Risk Factors: None identified.  Limitations: Body habitus, poor ultrasound/tissue interface, patient positioning, patient movement and bandages. Comparison Study: No prior studies. Performing Technologist: Oliver Hum RVT  Examination Guidelines: A complete evaluation includes B-mode imaging, spectral Doppler, color Doppler, and power Doppler as needed of all accessible portions of each vessel. Bilateral testing is considered an integral part of a complete examination. Limited examinations for reoccurring indications may be performed as noted. The reflux portion of the exam is performed with the patient in reverse Trendelenburg.   +---------+---------------+---------+-----------+----------+-------------------+ RIGHT    CompressibilityPhasicitySpontaneityPropertiesThrombus Aging      +---------+---------------+---------+-----------+----------+-------------------+ CFV      Partial        Yes      Yes                  Age Indeterminate   +---------+---------------+---------+-----------+----------+-------------------+ SFJ      Full                                                             +---------+---------------+---------+-----------+----------+-------------------+ FV Prox  Partial        Yes      Yes                  Age Indeterminate   +---------+---------------+---------+-----------+----------+-------------------+ FV Mid   Partial        No       No                   Age Indeterminate   +---------+---------------+---------+-----------+----------+-------------------+ FV DistalFull                                                             +---------+---------------+---------+-----------+----------+-------------------+ PFV      Full                                                             +---------+---------------+---------+-----------+----------+-------------------+ POP      Full           Yes      Yes                                      +---------+---------------+---------+-----------+----------+-------------------+ PTV      Full                                                             +---------+---------------+---------+-----------+----------+-------------------+ PERO  Not well visualized +---------+---------------+---------+-----------+----------+-------------------+ The external iliac, and common iliac veins appear patent.  +---------+---------------+---------+-----------+----------+-------------------+ LEFT     CompressibilityPhasicitySpontaneityPropertiesThrombus Aging       +---------+---------------+---------+-----------+----------+-------------------+ CFV      Partial        Yes      Yes                  Chronic             +---------+---------------+---------+-----------+----------+-------------------+ SFJ      Partial        Yes      Yes                  Chronic             +---------+---------------+---------+-----------+----------+-------------------+ FV Prox  Partial                                      Age Indeterminate   +---------+---------------+---------+-----------+----------+-------------------+ FV Mid   Partial        Yes      Yes                  Age Indeterminate   +---------+---------------+---------+-----------+----------+-------------------+ FV DistalPartial        Yes      Yes                  Age Indeterminate   +---------+---------------+---------+-----------+----------+-------------------+ PFV      Full                                                             +---------+---------------+---------+-----------+----------+-------------------+ POP      Partial        No       No                   Age Indeterminate   +---------+---------------+---------+-----------+----------+-------------------+ PTV      Full                                                             +---------+---------------+---------+-----------+----------+-------------------+ PERO                                                  Not well visualized +---------+---------------+---------+-----------+----------+-------------------+ The external iliac vein appears patent. Unable to assess the common iliac vein due to overlying bowel gas.    Summary: RIGHT: - Findings consistent with age indeterminate deep vein thrombosis involving the right common femoral vein, and right femoral vein. - No cystic structure found in the popliteal fossa.  LEFT: - Findings consistent with age indeterminate deep vein thrombosis involving the left femoral  vein, and left popliteal vein. 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 - No cystic structure found in the popliteal fossa.  *See table(s) above for measurements and observations. Electronically  signed by Deitra Mayo MD on 02/04/2021 at 5:01:52 PM.    Final     PHYSICAL EXAM General: Awake alert in no distress HEENT: Was very grammatic Cardiovascular: Regular rhythm Respiratory: Breathing well saturating normally on room air Extremities: Appear warm and well perfused with bilateral lower extremity swelling. Neurological exam Awake alert oriented x3 No evidence of aphasia or dysarthria Reduced attention concentration Cranial nerves: Pupils equal round react light, extraocular movements intact, visual fields appear full, face appears asymmetric with left lower facial weakness, left homonymous hemianopsia noted on exam. Motor examination: Left upper extremity 1/5, left lower extremity 2/5-3/5.  Right upper and lower extremity are full strength. Sensation diminished on the left and also extinction on double simultaneous stimulation. Coordination: No dysmetria on the right.  Unable to perform the left.  ASSESSMENT/PLAN Matthew Wise is a 60 y.o. male with history of hypertension, hyperlipidemia, DVT/PE on Eliquis, sickle cell disease presenting with sudden onset of left arm and leg weakness along with left facial droop with a last known well of 1500 hrs. on 02/02/2021.  Seen by telemedicine neurology and transported to [facility] for higher level of care due to right MCA M1 filling deficit and perfusion deficit on perfusion imaging-see details below.  Patient was seen in December/January as well as again in February for right MCA strokes-first 1 in the right M1 superior division and the second 1 involving patchy distribution of both superior and inferior division of the right MCAs.  This time around, CTA  showed no abrupt occlusion of the right M1 segment which is new as compared to the prior CTA from February 2022 suggesting underlying stenosis on the prior CTA that probably now occluded. There was an acute core infarct in the posterior right frontoparietal region likely corresponding with the completed infarct as seen on prior MRI but there was presumably penumbra of 57 cc considered tissue at risk for which he was taken for emergent IR thrombectomy.  MRI - large acute RMCA infarct with peticheal hemorrhage, minimal leftward shift, small acute left cerebellar infarct, new small acute/subacute left occipital, left parietal and left frontal lobe infarcts. Could be periprocedural but very suspicious for a cardioembolic etiology.  Stroke-likely atheroembolic from large vessel arterial sclerosis-question moyamoya like phenomenology  Code Stroke CT head no acute ICH.  Suspect acute infarction of the right frontoparietal lobes posterior superior to chronic infarct with probable involvement of lateral precentral and postcentral gyri.  CTA head & neck-abrupt right M1 segment occlusion new as compared to CTA from 01/25/2021.  This is suggestive of an underlying stenosis at this level on prior CTA from 01/25/2021.  Acute cord infarct at the posterior right frontoparietal region likely corresponding with the completed infarct as seen on prior MRI.  This may have partially expanded in the interim.  Surrounding ischemic penumbra 57 cc-consider tissue address.  Fetal PCA bilaterally and overall diminutive vertebrobasilar system.  Technically limited by contrast bolus  CT perfusion -57 cc penumbra-in hindsight likely overestimating.  CT head after the procedure 9 AM today-progressive nonhemorrhagic cortical infarct in the posterior right MCA territory with further loss of gray-white differentiation.  Progressive edema and mass-effect with 2 to 3 mm midline shift.  Hyperdense areas in the sulci likely reflective of  pseudosubarachnoid hemorrhage with areas of cortex adjacent to 1 another.  No definitive hemorrhage is present.  Stable remote infarct of the anterior right MCA territory.  MRI shows large acute RMCA infarct with peticheal hemorrhage, minimal leftward shift, small  acute left cerebellar infarct, new small acute/subacute left occipital, left parietal and left frontal lobe infarcts.  Could be periprocedural but very suspicious for a cardioembolic etiology-also due to increased LA size seen on TEE.  2D Echo completed on 01/22/2021 with LVEF 55 to 60%, left ventricle with regional wall motion abnormalities.  Left ventricular hypertrophy-mild.  Diastolic parameters of the left ventricle indeterminate.  Right ventricular systolic function normal.  Mitral valve normal.  Aortic valve is tricuspid and normal.  Left atrium normal.  TEE 08/2020 - no shunt, no appendage clot, no LV thrombus, mild dilated LA size   LDL 25  HgbA1c 5.9  VTE prophylaxis -SCDs for now.  May start chemical prophylaxis after MRI is completed and shows no large bleed.    Diet   DIET DYS 3 Room service appropriate? Yes; Fluid consistency: Nectar Thick    Eliquis (apixaban) daily prior to admission, now on  low-dose aspirin.   Long-term might need to switch Eliquis to another DOAC.  Therapy recommendations: Pending  Disposition: Pending  Cerebral Edema-expected with the large size of the stroke, with subtle worsening on overnight imaging with increased midline shift. -Repeat CT head 3/8 shows increased size of large right MCA territory infarct with unchanged subarachnoid hemorrhage. There is increased mass effect on the right lateral ventricle and 3 mm of left midline shift. A small left cerebellar infarct is also noted.  -For now we will treat with hypertonic saline --starting 3 08/20/2011 -goal sodium 145-150 -Cerebral edema likely will resolve in the next few days.  Seizure-like activity noted clinically yesterday -Left  upper extremity shaking -EEG concerning for multiple seizures.  Formal read pending. -Load with Keppra 3 g followed by Keppra 500 twice daily  Hypertension, Hypertensive urgency  Amlodipine-10 mg daily.  Lisinopril at 10 mg daily-increased to 20 mg.  Systolic blood pressure goal less than 160  SBP <160 for today . Long-term BP goal normotensive  Hyperlipidemia  Home meds:  Lipitor 40  LDL 25, goal < 70  Will reduce to Lipitor 20  Continue statin at discharge  Leg swelling -h/o DVT bilat LE dopplers +right common femoral vein, and right femoral vein. left femoral vein, and left popliteal vein.  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  -Started heparin stroke protocol yesterday given the history of DVT, presence of the chronic DVTs as well as personal history of PE.  This is with the understanding that he is at high risk for bleeding.  In case of any neuro change, very low threshold for repeat head CT.  Other Stroke Risk Factors  Cigarette smoker advised to stop smoking  Obesity, Body mass index is 33.94 kg/m., BMI >/= 30 associated with increased stroke risk, recommend weight loss, diet and exercise as appropriate   Hx stroke/TIA  OSA   Other risk factors  HgbA1c 5.9, goal < 7.0  Hospital day # 3  Other Problems CKD 3   CRITICAL CARE ATTESTATION Performed by: Amie Portland, MD Total critical care time:44 minutes Critical care time was exclusive of separately billable procedures and treating other patients and/or supervising APPs/Residents/Students Critical care was necessary to treat or prevent imminent or life-threatening deterioration due to stroke, DVT, hypertension This patient is critically ill and at significant risk for neurological worsening and/or death and care requires constant monitoring. Critical care was time spent personally by me on the following activities: development of  treatment plan with patient and/or surrogate  as well as nursing, discussions with consultants, evaluation of patient's response to treatment, examination of patient, obtaining history from patient or surrogate, ordering and performing treatments and interventions, ordering and review of laboratory studies, ordering and review of radiographic studies, pulse oximetry, re-evaluation of patient's condition, participation in multidisciplinary rounds and medical decision making of high complexity in the care of this patient.   --  To contact Stroke Continuity provider, please refer to [email or web address] After hours, contact General Neurology

## 2021-02-06 DIAGNOSIS — I6601 Occlusion and stenosis of right middle cerebral artery: Secondary | ICD-10-CM | POA: Diagnosis not present

## 2021-02-06 DIAGNOSIS — I639 Cerebral infarction, unspecified: Secondary | ICD-10-CM | POA: Diagnosis not present

## 2021-02-06 LAB — SODIUM
Sodium: 141 mmol/L (ref 135–145)
Sodium: 142 mmol/L (ref 135–145)
Sodium: 144 mmol/L (ref 135–145)

## 2021-02-06 LAB — COMPREHENSIVE METABOLIC PANEL
ALT: 20 U/L (ref 0–44)
AST: 25 U/L (ref 15–41)
Albumin: 3.3 g/dL — ABNORMAL LOW (ref 3.5–5.0)
Alkaline Phosphatase: 73 U/L (ref 38–126)
Anion gap: 7 (ref 5–15)
BUN: 10 mg/dL (ref 6–20)
CO2: 24 mmol/L (ref 22–32)
Calcium: 9.2 mg/dL (ref 8.9–10.3)
Chloride: 111 mmol/L (ref 98–111)
Creatinine, Ser: 1.35 mg/dL — ABNORMAL HIGH (ref 0.61–1.24)
GFR, Estimated: >60 mL/min (ref >60)
Glucose, Bld: 134 mg/dL — ABNORMAL HIGH (ref 70–99)
Potassium: 3.6 mmol/L (ref 3.5–5.1)
Sodium: 142 mmol/L (ref 135–145)
Total Bilirubin: 1.5 mg/dL — ABNORMAL HIGH (ref 0.3–1.2)
Total Protein: 6.9 g/dL (ref 6.5–8.1)

## 2021-02-06 LAB — CBC
HCT: 38.7 % — ABNORMAL LOW (ref 39.0–52.0)
Hemoglobin: 13.2 g/dL (ref 13.0–17.0)
MCH: 28.7 pg (ref 26.0–34.0)
MCHC: 34.1 g/dL (ref 30.0–36.0)
MCV: 84.1 fL (ref 80.0–100.0)
Platelets: 167 10*3/uL (ref 150–400)
RBC: 4.6 MIL/uL (ref 4.22–5.81)
RDW: 13.6 % (ref 11.5–15.5)
WBC: 8.7 10*3/uL (ref 4.0–10.5)
nRBC: 0 % (ref 0.0–0.2)

## 2021-02-06 LAB — GLUCOSE, CAPILLARY: Glucose-Capillary: 90 mg/dL (ref 70–99)

## 2021-02-06 LAB — HEPARIN LEVEL (UNFRACTIONATED): Heparin Unfractionated: 1 IU/mL — ABNORMAL HIGH (ref 0.30–0.70)

## 2021-02-06 LAB — APTT
aPTT: 69 seconds — ABNORMAL HIGH (ref 24–36)
aPTT: 72 seconds — ABNORMAL HIGH (ref 24–36)

## 2021-02-06 MED ORDER — LISINOPRIL 20 MG PO TABS
40.0000 mg | ORAL_TABLET | Freq: Every day | ORAL | Status: DC
  Administered 2021-02-06 – 2021-02-12 (×7): 40 mg via ORAL
  Filled 2021-02-06 (×7): qty 2

## 2021-02-06 MED ORDER — HYDRALAZINE HCL 50 MG PO TABS
50.0000 mg | ORAL_TABLET | Freq: Three times a day (TID) | ORAL | Status: DC
  Administered 2021-02-06 – 2021-02-07 (×3): 50 mg via ORAL
  Filled 2021-02-06 (×3): qty 1

## 2021-02-06 NOTE — Progress Notes (Signed)
ANTICOAGULATION CONSULT NOTE   Pharmacy Consult:  Heparin Indication:  VTE treatment  Heparin dosing weight: 104.9 kg  Labs: Recent Labs    02/04/21 0930 02/04/21 2301 02/04/21 2301 02/05/21 0309 02/05/21 0655 02/05/21 1609 02/06/21 0155  HGB 12.8*  --   --   --   --   --  13.2  HCT 36.1*  --   --   --   --   --  38.7*  PLT 194  --   --   --   --   --  167  APTT  --  43*   < >  --  50* 62* 69*  HEPARINUNFRC  --  0.87*  --  0.92*  --   --  1.00*  CREATININE 1.64*  --   --   --   --   --   --    < > = values in this interval not displayed.     Assessment: 43 YOM with history of CVA presented to [facility] with left arm/leg weakness. Patient transferred to [facility] for revascularization of acute CVA on 02/03/21. He was on Eliquis PTA for history of PE/DVT, last dose on 3/6 at 0830.  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 Pharmacy consulted to dose IV heparin per stroke protocol.  aPTT remains subtherapeutic, increased to 62 sec after rate increase this AM.  No issue with heparin infusion nor bleeding per RN.  3/10 AM update:  APTT therapeutic after rate increase  Goal of Therapy:  Heparin level 0.3-0.5 units/ml aPTT 66-85 seconds Monitor platelets by anticoagulation protocol: Yes   Plan:  No bolus per protocol Cont heparin at 1750 units/hr Confirmatory aPTT at 0800   Narda Bonds, PharmD, [place] Pharmacist Phone: [phone]

## 2021-02-06 NOTE — Evaluation (Addendum)
Occupational Therapy Evaluation Patient Details Name: Matthew Wise MRN: [id number] DOB: 06-Sep-1961 Today's Date: 02/06/2021    History of Present Illness The pt is a 60 yo male presenting from home on 3/6 with L-sided weakness. Imaging revealed occlusion of R M1 segment and infarct in posterior R frontoparietal region. Pt is now s/p arteriogram with revascularization of R MCA M1 segment. 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 The pt has been reccently admitted (2/22-2/28) with R MCA stroke and was d/c home. PMH includes: CVA, CKD III, DVT, HTN, PE, HLD.   Clinical Impression   PTA, pt was living alone and was performing BADLs and using cane for mobility; sister assisting with IADLs. Pt currently requiring Mod A for UB ADLs, Max A for LB ADLs, and Mod A +2 for sit<>stand with and without stedy lift. Pt presenting with weakness, L inattention, LUE flaccid, L lateral lean in sitting and standing, and decreased cognition. Pt very motivated to return to PLOF. Pt would benefit from further acute OT to facilitate safe dc. Recommend dc to CIR for further OT to optimize safety, independence with ADLs, and return to PLOF.     Follow Up Recommendations  CIR    Equipment Recommendations  Other (comment) (Defer to next venue)    Recommendations for Other Services Rehab consult;PT consult;Speech consult     Precautions / Restrictions Precautions Precautions: Fall      Mobility Bed Mobility Overal bed mobility: Needs Assistance Bed Mobility: Supine to Sit     Supine to sit: Min assist;Mod assist;HOB elevated     General bed mobility comments: Min A for bringing BLEs towards EOB. Min-Mod A for elevating trunk    Transfers Overall transfer level: Needs assistance Equipment used: 2 person hand held assist Transfers: Sit to/from Stand Sit to Stand: Mod assist;+2 physical assistance;+2 safety/equipment;From elevated surface         General transfer comment: Mod A +2  to power up into standing. Blocking at L knee.    Balance Overall balance assessment: Needs assistance Sitting-balance support: Single extremity supported;Feet supported Sitting balance-Leahy Scale: Fair     Standing balance support: Bilateral upper extremity supported;During functional activity Standing balance-Leahy Scale: Poor                             ADL either performed or assessed with clinical judgement   ADL Overall ADL's : Needs assistance/impaired Eating/Feeding: Minimal assistance;Sitting Eating/Feeding Details (indicate cue type and reason): Assistance for bilateral tasks Grooming: Minimal assistance;Sitting;Moderate assistance   Upper Body Bathing: Moderate assistance;Sitting   Lower Body Bathing: Maximal assistance;Sit to/from stand   Upper Body Dressing : Moderate assistance;Sitting   Lower Body Dressing: Maximal assistance;Sit to/from stand Lower Body Dressing Details (indicate cue type and reason): Pt attempting to bend forward and pull up sock over heal but unable. Requiring Max A to don sock             Functional mobility during ADLs: Moderate assistance;+2 for physical assistance (sit<>stand with and without stedy) General ADL Comments: Pt presenting with decreased balance, strength, cognition, and use of LUE.     Vision Baseline Vision/History: Wears glasses Wears Glasses: Reading only Additional Comments: Need to further assess     Perception Perception Perception Tested?: Yes Perception Deficits: Inattention/neglect Inattention/Neglect: Does not attend to left visual field;Does not attend to left side of body   Praxis      Pertinent Vitals/Pain Pain Assessment: No/denies  pain     Hand Dominance Right   Extremity/Trunk Assessment Upper Extremity Assessment Upper Extremity Assessment: LUE deficits/detail LUE Deficits / Details: flaccid LUE Sensation: decreased light touch LUE Coordination: decreased fine motor;decreased  gross motor   Lower Extremity Assessment Lower Extremity Assessment: Defer to PT evaluation   Cervical / Trunk Assessment Cervical / Trunk Assessment: Other exceptions Cervical / Trunk Exceptions: Lateral leaning to L   Communication Communication Communication: No difficulties   Cognition Arousal/Alertness: Awake/alert Behavior During Therapy: Flat affect Overall Cognitive Status: Impaired/Different from baseline Area of Impairment: Problem solving;Safety/judgement                       Following Commands: Follows one step commands with increased time Safety/Judgement: Decreased awareness of safety   Problem Solving: Slow processing;Difficulty sequencing;Requires verbal cues General Comments: Presenting with flat affect. Feel possible pt is overwhelmed by situation. Pt following simple, direct cues (unrelation of L side) majority of time. Pt with inattention to L (body and environment).   General Comments  VSS; RN present throughout    Exercises     Shoulder Instructions      Home Living Family/patient expects to be discharged to:: Private residence Living Arrangements: Alone Available Help at Discharge: Other (Comment) (Sister's check in regularly) Type of Home: House Home Access: Stairs to enter [employer] of Steps: 2 Entrance Stairs-Rails: Right Home Layout: One level     Bathroom Shower/Tub: Teacher, early years/pre: Standard     Home Equipment: Cane - quad;Shower seat          Prior Functioning/Environment Level of Independence: Independent with assistive device(s)        Comments: Uses four prong cane        OT Problem List: Decreased strength;Decreased activity tolerance;Impaired balance (sitting and/or standing);Impaired vision/perception;Decreased cognition;Decreased knowledge of precautions      OT Treatment/Interventions: Self-care/ADL training;Therapeutic exercise;Neuromuscular education;DME and/or AE  instruction;Therapeutic activities;Cognitive remediation/compensation;Visual/perceptual remediation/compensation;Balance training;Patient/family education    OT Goals(Current goals can be found in the care plan section) Acute Rehab OT Goals Patient Stated Goal: return home with family to assist OT Goal Formulation: With patient Time For Goal Achievement: 02/09/21 Potential to Achieve Goals: Good  OT Frequency: Min 2X/week   Barriers to D/C:            Co-evaluation              AM-PAC OT "6 Clicks" Daily Activity     Outcome Measure Help from another person eating meals?: A Little Help from another person taking care of personal grooming?: A Lot Help from another person toileting, which includes using toliet, bedpan, or urinal?: A Lot Help from another person bathing (including washing, rinsing, drying)?: A Lot Help from another person to put on and taking off regular upper body clothing?: A Lot Help from another person to put on and taking off regular lower body clothing?: A Lot 6 Click Score: 13   End of Session Equipment Utilized During Treatment: Gait belt Nurse Communication: Mobility status  Activity Tolerance: Patient tolerated treatment well Patient left: in chair;with call bell/phone within reach;with chair alarm set;with family/visitor present  OT Visit Diagnosis: Unsteadiness on feet (R26.81);Other abnormalities of gait and mobility (R26.89);Hemiplegia and hemiparesis;Other symptoms and signs involving cognitive function;Other symptoms and signs involving the nervous system (R29.898) Hemiplegia - Right/Left: Left Hemiplegia - dominant/non-dominant: Non-Dominant Hemiplegia - caused by: Cerebral infarction  Time: 8185-6314 OT Time Calculation (min): 31 min Charges:  OT General Charges $OT Visit: 1 Visit OT Evaluation $OT Eval Moderate Complexity: [place], OTR/L Acute Rehab Pager: [phone] Office: [place] 02/06/2021, 3:53 PM

## 2021-02-06 NOTE — Progress Notes (Signed)
LTM discontinued; no skin breakdown was seen. [facility] notified.

## 2021-02-06 NOTE — Progress Notes (Addendum)
STROKE TEAM PROGRESS NOTE   INTERVAL HISTORY Seen and examined.  Concern for left upper extremity twitching overnight for which Dr. Cheral Marker was contacted, EEG with artifact.  Put on continuous EEG which initially showed multiple brief focal seizures but over the last 24 hours, no seizures.  No concerns about the exam from nursing staff.   Vitals:   02/06/21 0600 02/06/21 0700 02/06/21 0706 02/06/21 0800  BP: (!) 174/81 (!) 165/83 (!) 126/95   Pulse:  (!) 54 (!) 53   Resp: 11 12 12    Temp:    98.3 F (36.8 C)  TempSrc:    Oral  SpO2:  98% 98%   Weight:      Height:       CBC    Component Value Date/Time   WBC 8.7 02/06/2021 0155   RBC 4.60 02/06/2021 0155   HGB 13.2 02/06/2021 0155   HCT 38.7 (L) 02/06/2021 0155   PLT 167 02/06/2021 0155   MCV 84.1 02/06/2021 0155   MCH 28.7 02/06/2021 0155   MCHC 34.1 02/06/2021 0155   RDW 13.6 02/06/2021 0155   LYMPHSABS 1.4 02/04/2021 0930   MONOABS 0.9 02/04/2021 0930   EOSABS 0.1 02/04/2021 0930   BASOSABS 0.0 02/04/2021 0930   CMP Latest Ref Rng & Units 02/06/2021 02/06/2021 02/05/2021  Glucose 70 - 99 mg/dL - - -  BUN 6 - 20 mg/dL - - -  Creatinine 0.61 - 1.24 mg/dL - - -  Sodium 135 - 145 mmol/L 142 141 137  Potassium 3.5 - 5.1 mmol/L - - -  Chloride 98 - 111 mmol/L - - -  CO2 22 - 32 mmol/L - - -  Calcium 8.9 - 10.3 mg/dL - - -  Total Protein 6.5 - 8.1 g/dL - - -  Total Bilirubin 0.3 - 1.2 mg/dL - - -  Alkaline Phos 38 - 126 U/L - - -  AST 15 - 41 U/L - - -  ALT 0 - 44 U/L - - -     IMAGING past 24 hours  PHYSICAL EXAM General: Awake alert in no distress HEENT: Was very grammatic Cardiovascular: Regular rhythm Respiratory: Breathing well saturating normally on room air Extremities: Appear warm and well perfused with bilateral lower extremity swelling. Neurological exam Awake alert oriented x3 No evidence of aphasia or dysarthria Reduced attention concentration Cranial nerves: Pupils equal round react light,  extraocular movements intact, visual fields appear full, face appears asymmetric with left lower facial weakness, left homonymous hemianopsia noted on exam. Motor examination: Left upper extremity 1/5, left lower extremity 2/5-3/5.  Right upper and lower extremity are full strength. Sensation diminished on the left and also extinction on double simultaneous stimulation. Coordination: No dysmetria on the right.  Unable to perform the left. Essentially unchanged from yesterday  ASSESSMENT/PLAN Matthew Wise is a 60 y.o. male with history of hypertension, hyperlipidemia, DVT/PE on Eliquis, sickle cell disease presenting with sudden onset of left arm and leg weakness along with left facial droop with a last known well of 1500 hrs. on 02/02/2021.  Seen by telemedicine neurology and transported to [facility] for higher level of care due to right MCA M1 filling deficit and perfusion deficit on perfusion imaging-see details below.  Patient was seen in December/January as well as again in February for right MCA strokes-first 1 in the right M1 superior division and the second 1 involving patchy distribution of both superior and inferior division of the right MCAs.  This time  around, CTA showed no abrupt occlusion of the right M1 segment which is new as compared to the prior CTA from February 2022 suggesting underlying stenosis on the prior CTA that probably now occluded. There was an acute core infarct in the posterior right frontoparietal region likely corresponding with the completed infarct as seen on prior MRI but there was presumably penumbra of 57 cc considered tissue at risk for which he was taken for emergent IR thrombectomy.  MRI - large acute RMCA infarct with peticheal hemorrhage, minimal leftward shift, small acute left cerebellar infarct, new small acute/subacute left occipital, left parietal and left frontal lobe infarcts. Could be periprocedural but very suspicious for a cardioembolic  etiology.  Stroke-likely atheroembolic from large vessel arterial sclerosis-question moyamoya like phenomenology  Code Stroke CT head no acute ICH.  Suspect acute infarction of the right frontoparietal lobes posterior superior to chronic infarct with probable involvement of lateral precentral and postcentral gyri.  CTA head & neck-abrupt right M1 segment occlusion new as compared to CTA from 01/25/2021.  This is suggestive of an underlying stenosis at this level on prior CTA from 01/25/2021.  Acute cord infarct at the posterior right frontoparietal region likely corresponding with the completed infarct as seen on prior MRI.  This may have partially expanded in the interim.  Surrounding ischemic penumbra 57 cc-consider tissue address.  Fetal PCA bilaterally and overall diminutive vertebrobasilar system.  Technically limited by contrast bolus  CT perfusion -57 cc penumbra-in hindsight likely overestimating.  CT head after the procedure 9 AM today-progressive nonhemorrhagic cortical infarct in the posterior right MCA territory with further loss of gray-white differentiation.  Progressive edema and mass-effect with 2 to 3 mm midline shift.  Hyperdense areas in the sulci likely reflective of pseudosubarachnoid hemorrhage with areas of cortex adjacent to 1 another.  No definitive hemorrhage is present.  Stable remote infarct of the anterior right MCA territory.  MRI shows large acute RMCA infarct with peticheal hemorrhage, minimal leftward shift, small acute left cerebellar infarct, new small acute/subacute left occipital, left parietal and left frontal lobe infarcts.  Could be periprocedural but very suspicious for a cardioembolic etiology-also due to increased LA size seen on TEE.  2D Echo completed on 01/22/2021 with LVEF 55 to 60%, left ventricle with regional wall motion abnormalities.  Left ventricular hypertrophy-mild.  Diastolic parameters of the left ventricle indeterminate.  Right ventricular  systolic function normal.  Mitral valve normal.  Aortic valve is tricuspid and normal.  Left atrium normal.  TEE 08/2020 - no shunt, no appendage clot, no LV thrombus, mild dilated LA size   LDL 25  HgbA1c 5.9  VTE prophylaxis -SCDs for now.  May start chemical prophylaxis after MRI is completed and shows no large bleed.    Diet   DIET DYS 3 Room service appropriate? Yes; Fluid consistency: Thin    Eliquis (apixaban) daily prior to admission, now on  low-dose aspirin and heparin drip for DVT.  Long-term might need to switch Eliquis to another DOAC or warfarin  Therapy recommendations: Pending  Disposition: Pending  Cerebral Edema-expected with the large size of the stroke, with subtle worsening on overnight imaging with increased midline shift. -Repeat CT head 3/8 shows increased size of large right MCA territory infarct with unchanged subarachnoid hemorrhage. There is increased mass effect on the right lateral ventricle and 3 mm of left midline shift. A small left cerebellar infarct is also noted.  -On HTS --starting 3 08/20/2011 -goal sodium 145-150 - currently 142. Continue at  75/h via PIV -Cerebral edema likely will resolve in the next few days. -Consider repeat CTH if worsens clinically  Seizure-like activity noted clinically and confirmed on EEG. -Left upper extremity shaking -EEG concerning for multiple seizures.   -Load with Keppra 3 g followed by Keppra 500 twice daily --Good control of seizures as noted on overnight EEG with no sz. Can dc LTM - will need to continue antiepileptics.  Hypertension, Hypertensive urgency  Amlodipine-10 mg daily.  Lisinopril at 20 mg daily.  Increase to 40 mg today.. Add Hydralazine 50 TID   Systolic blood pressure goal less than 160  Has not required Cleviprex but has required multiple doses of as needed labetalol injections to keep blood pressure below 160.  SBP <160 for today . Long-term BP goal  normotensive  Hyperlipidemia  Home meds:  Lipitor 40  LDL 25, goal < 70  Will reduce to Lipitor 20  Continue statin at discharge  Leg swelling -h/o DVT bilat LE dopplers +right common femoral vein, and right femoral vein. left femoral vein, and left popliteal vein.  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  -Started heparin stroke protocol yesterday given the history of DVT, presence of the chronic DVTs as well as personal history of PE.  This is with the understanding that he is at high risk for bleeding.  In case of any neuro change, very low threshold for repeat head CT.  Other Stroke Risk Factors  Cigarette smoker advised to stop smoking  Obesity, Body mass index is 33.94 kg/m., BMI >/= 30 associated with increased stroke risk, recommend weight loss, diet and exercise as appropriate   Hx stroke/TIA  OSA   Other risk factors  HgbA1c 5.9, goal < 7.0  Hospital day # 4  Other Problems CKD 3  If his blood pressure remains within goal and he has not had Cleviprex requirement for another 6 to 12 hours, can consider moving out of the ICU to progressive floor.  CRITICAL CARE ATTESTATION Performed by: Amie Portland, MD Total critical care time:35 minutes Critical care time was exclusive of separately billable procedures and treating other patients and/or supervising APPs/Residents/Students Critical care was necessary to treat or prevent imminent or life-threatening deterioration due to stroke, DVT, hypertension This patient is critically ill and at significant risk for neurological worsening and/or death and care requires constant monitoring. Critical care was time spent personally by me on the following activities: development of treatment plan with patient and/or surrogate as well as nursing, discussions with consultants, evaluation of patient's response to treatment, examination of patient,  obtaining history from patient or surrogate, ordering and performing treatments and interventions, ordering and review of laboratory studies, ordering and review of radiographic studies, pulse oximetry, re-evaluation of patient's condition, participation in multidisciplinary rounds and medical decision making of high complexity in the care of this patient.   -- Amie Portland, MD Stroke Neurology Pager: [phone]   To contact Stroke Continuity provider, please refer to [email or web address] After hours, contact General Neurology

## 2021-02-06 NOTE — Evaluation (Signed)
Physical Therapy Evaluation Patient Details Name: Matthew Wise MRN: [id number] DOB: January 08, 1961 Today's Date: 02/06/2021   History of Present Illness  The pt is a 60 yo male presenting from home on 3/6 with L-sided weakness. Imaging revealed occlusion of R M1 segment and infarct in posterior R frontoparietal region. Pt is now s/p arteriogram with revascularization of R MCA M1 segment. 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 The pt has been reccently admitted (2/22-2/28) with R MCA stroke and was d/c home. PMH includes: CVA, CKD III, DVT, HTN, PE, HLD.    Clinical Impression  Pt in bed upon arrival of PT, agreeable to evaluation at this time. Prior to admission the pt was independent with use of quad cane for mobility at home, independent with ADLs in the home, relying on his sisters for trips into the community.  The pt now presents with limitations in functional mobility, strength, sensation, stability, and problem solving due to above dx, and will continue to benefit from skilled PT to address these deficits. The pt is eager to mobilize and highly motivated, but slightly discouraged by significance of deficits at this time. He was able to complete transfer to sitting EOB, but requires min/modA of 1 and elevated HOB to come to sitting. He continued to require assist for sit-stand transfers (maxA of 2) to manage midline positioning, blocking of L knee, and assist at trunk and hips to maintain upright. The pt was transferred to recliner with use of stedy for pt and therapist safety at this time, as he was unable to maintain upright or initiate wt shift for stepping without maxA of 2 and BUE support. The pt will continue to benefit from skilled PT to progress functional strength, power, stability, and capacity for OOB transfers with less assistance to work towards pt goal of eventual return home.      Follow Up Recommendations CIR    Equipment Recommendations   (defer to post acute)     Recommendations for Other Services Rehab consult     Precautions / Restrictions Precautions Precautions: Fall Precaution Comments: L hemiplegia Restrictions Weight Bearing Restrictions: No      Mobility  Bed Mobility Overal bed mobility: Needs Assistance Bed Mobility: Supine to Sit     Supine to sit: Min assist;Mod assist;HOB elevated     General bed mobility comments: Min A for bringing BLEs towards EOB. Min-Mod A for elevating trunk    Transfers Overall transfer level: Needs assistance Equipment used: 2 person hand held assist Transfers: Sit to/from Stand Sit to Stand: Mod assist;+2 physical assistance;+2 safety/equipment;From elevated surface Stand pivot transfers: Total assist (with stedy)       General transfer comment: Mod A +2 to power up into standing. Blocking at L knee. used stedy to transfer from EOB to recliner, requires support on R side in stedy due to poor maintenance of midline in sitting  Ambulation/Gait             General Gait Details: deferred at this time for safety, pt with absent functional use of LLE and strong L-sided lean   Modified Rankin (Stroke Patients Only) Modified Rankin (Stroke Patients Only) Pre-Morbid Rankin Score: Slight disability Modified Rankin: Severe disability     Balance Overall balance assessment: Needs assistance Sitting-balance support: Single extremity supported;Feet supported Sitting balance-Leahy Scale: Fair Sitting balance - Comments: L lean unless cued to use RUE on bed rail to steady Postural control: Left lateral lean Standing balance support: Bilateral upper extremity supported;During functional  activity Standing balance-Leahy Scale: Poor Standing balance comment: hand over hand on LUE to grip onto AD or recliner                             Pertinent Vitals/Pain Pain Assessment: No/denies pain Pain Intervention(s): Monitored during session    [place] expects to  be discharged to:: Private residence Living Arrangements: Alone Available Help at Discharge: Other (Comment) (sisters check on him regularly) Type of Home: House Home Access: Stairs to enter Entrance Stairs-Rails: Right Entrance Stairs-Number of Steps: 2-3 Home Layout: One level Home Equipment: [place] - quad;Shower seat      Prior Function Level of Independence: Independent with assistive device(s)         Comments: Uses four prong cane, cooks for himself, uses scooter at [place]: Right    Extremity/Trunk Assessment   Upper Extremity Assessment Upper Extremity Assessment: Defer to OT evaluation LUE Deficits / Details: flaccid LUE Sensation: decreased light touch LUE Coordination: decreased fine motor;decreased gross motor    Lower Extremity Assessment Lower Extremity Assessment: LLE deficits/detail LLE Deficits / Details: grossly 2/5. pt able to activate through minimal AROM LLE Sensation: decreased light touch (pt originally denies difference in sensation, unable to detect any touch to LLE, absent two-point discrimination) LLE Coordination: decreased fine motor;decreased gross motor    Cervical / Trunk Assessment Cervical / Trunk Assessment: Other exceptions Cervical / Trunk Exceptions: Lateral leaning to L  Communication   Communication: No difficulties  Cognition Arousal/Alertness: Awake/alert Behavior During Therapy: Flat affect Overall Cognitive Status: Impaired/Different from baseline Area of Impairment: Problem solving;Safety/judgement                       Following Commands: Follows one step commands with increased time Safety/Judgement: Decreased awareness of safety   Problem Solving: Slow processing;Difficulty sequencing;Requires verbal cues General Comments: Presenting with flat affect. Feel possible pt is overwhelmed by situation. Pt following simple, direct cues (unrelation of L side) majority of time. Pt  with inattention to L (body and environment).      General Comments General comments (skin integrity, edema, etc.): VSS, RN present throughout and watched transfer    Exercises     Assessment/Plan    PT Assessment Patient needs continued PT services  PT Problem List Decreased strength;Decreased activity tolerance;Decreased balance;Decreased mobility;Decreased coordination;Decreased knowledge of use of DME       PT Treatment Interventions DME instruction;Gait training;Stair training;Functional mobility training;Therapeutic activities;Therapeutic exercise;Neuromuscular re-education;Balance training;Cognitive remediation;Patient/family education    PT Goals (Current goals can be found in the Care Plan section)  Acute Rehab PT Goals Patient Stated Goal: improve mobility PT Goal Formulation: With patient Time For Goal Achievement: 02/20/21 Potential to Achieve Goals: Good    Frequency Min 4X/week   Barriers to discharge Decreased caregiver support pt from home alone    Co-evaluation PT/OT/SLP Co-Evaluation/Treatment: Yes Reason for Co-Treatment: For patient/therapist safety;To address functional/ADL transfers PT goals addressed during session: Mobility/safety with mobility;Strengthening/ROM;Balance         AM-PAC PT "6 Clicks" Mobility  Outcome Measure Help needed turning from your back to your side while in a flat bed without using bedrails?: A Lot Help needed moving from lying on your back to sitting on the side of a flat bed without using bedrails?: A Lot Help needed moving to and from a bed to a chair (including a wheelchair)?:  Total Help needed standing up from a chair using your arms (e.g., wheelchair or bedside chair)?: A Lot Help needed to walk in hospital room?: Total Help needed climbing 3-5 steps with a railing? : Total 6 Click Score: 9    End of Session Equipment Utilized During Treatment: Gait belt Activity Tolerance: Patient tolerated treatment  well Patient left: in chair;with call bell/phone within reach;with chair alarm set;with nursing/sitter in room Nurse Communication: Mobility status;Need for lift equipment (stedy or lift) PT Visit Diagnosis: Muscle weakness (generalized) (M62.81);Other symptoms and signs involving the nervous system (R29.898);Other abnormalities of gait and mobility (R26.89)    Time: 0315-9458 PT Time Calculation (min) (ACUTE ONLY): 32 min   Charges:   PT Evaluation $PT Eval Moderate Complexity: 1 Mod          Karma Ganja, PT, DPT   Acute Rehabilitation Department Pager #: [phone]  Otho Bellows 02/06/2021, 4:41 PM

## 2021-02-06 NOTE — Procedures (Signed)
Patient Name:Matthew Wise KET:[id number] Epilepsy Attending:Abdulrahman Bracey Barbra Sarks Referring Physician/Provider:Dr Kerney Elbe Duration:02/05/2021 2251 to 02/06/2021 1056   Patient history:60 year old male with right upper extremity twitching. EEG to evaluate for seizures.   Level of alertness:Awake, asleep  AEDs during EEG study:None  Technical aspects: This EEG study was done with scalp electrodes positioned according to the 10-20 International system of electrode placement. Electrical activity was acquired at a sampling rate of 500Hz  and reviewed with a high frequency filter of 70Hz  and a low frequency filter of 1Hz . EEG data were recorded continuously and digitally stored.   Description: The posterior dominant rhythm consists of 9 Hz activity of moderate voltage (25-35 uV) seen predominantly in posterior head regions, symmetric and reactive to eye opening and eye closing. Sleep was characterized by vertex waves, sleep spindles (12-14hz ), maximal frontocentral region. EEG showed continuous 3-5hz  theta-delta slowing in right frontotemporal region.  ABNORMALITY - Continuous slow, right frontotemporal region  IMPRESSION: Thisstudy showed evidence cortical dysfunction in right frontotemporal region likely due to underlying stroke.  No definite seizures were seen during the study.  Dicie Edelen Barbra Sarks

## 2021-02-06 NOTE — Progress Notes (Signed)
ANTICOAGULATION CONSULT NOTE   Pharmacy Consult:  Heparin Indication:  VTE treatment  Heparin dosing weight: 104.9 kg  Labs: Recent Labs    02/04/21 0930 02/04/21 2301 02/05/21 0309 02/05/21 0655 02/05/21 1609 02/06/21 0155 02/06/21 0741  HGB 12.8*  --   --   --   --  13.2  --   HCT 36.1*  --   --   --   --  38.7*  --   PLT 194  --   --   --   --  167  --   APTT  --  43*  --    < > 62* 69* 72*  HEPARINUNFRC  --  0.87* 0.92*  --   --  1.00*  --   CREATININE 1.64*  --   --   --   --   --   --    < > = values in this interval not displayed.     Assessment: Matthew Wise with history of CVA presented to [facility] with left arm/leg weakness. Patient transferred to [facility] for revascularization of acute CVA on 02/03/21. He was on Eliquis PTA for history of PE/DVT, last dose on 3/6 at 0830.  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 Pharmacy consulted to dose IV heparin per stroke protocol.  aPTT remains therapeutic at 72 seconds. Heparin level remains elevated, as expected, due to influence of apixaban. No issue with heparin infusion or bleeding reported.  Goal of Therapy:  Heparin level 0.3-0.5 units/ml aPTT 66-85 seconds Monitor platelets by anticoagulation protocol: Yes   Plan:  Continue heparin at 1750 units/hr Monitor daily heparin level/aPTT until correlating, CBC, s/sx bleeding   Arturo Morton, PharmD, BCPS Please check [facility] for all [place] contact numbers Clinical Pharmacist 02/06/2021 10:00 AM

## 2021-02-06 NOTE — Progress Notes (Signed)
Rehab Admissions Coordinator Note:  Patient was screened by Cleatrice Burke for appropriateness for an Inpatient Acute Rehab Consult per therapy recs. .  At this time, we are recommending Inpatient Rehab consult. I will place order per protocol.  Cleatrice Burke RN MSN 02/06/2021, 8:04 PM  I can be reached at [phone].

## 2021-02-07 DIAGNOSIS — I6601 Occlusion and stenosis of right middle cerebral artery: Secondary | ICD-10-CM

## 2021-02-07 DIAGNOSIS — I639 Cerebral infarction, unspecified: Secondary | ICD-10-CM | POA: Diagnosis not present

## 2021-02-07 LAB — CBC
HCT: 39 % (ref 39.0–52.0)
Hemoglobin: 13.4 g/dL (ref 13.0–17.0)
MCH: 28.5 pg (ref 26.0–34.0)
MCHC: 34.4 g/dL (ref 30.0–36.0)
MCV: 83 fL (ref 80.0–100.0)
Platelets: 186 10*3/uL (ref 150–400)
RBC: 4.7 MIL/uL (ref 4.22–5.81)
RDW: 13.5 % (ref 11.5–15.5)
WBC: 6.8 10*3/uL (ref 4.0–10.5)
nRBC: 0 % (ref 0.0–0.2)

## 2021-02-07 LAB — APTT
aPTT: 59 seconds — ABNORMAL HIGH (ref 24–36)
aPTT: 89 seconds — ABNORMAL HIGH (ref 24–36)

## 2021-02-07 LAB — BASIC METABOLIC PANEL
Anion gap: 10 (ref 5–15)
BUN: 9 mg/dL (ref 6–20)
CO2: 23 mmol/L (ref 22–32)
Calcium: 9.4 mg/dL (ref 8.9–10.3)
Chloride: 108 mmol/L (ref 98–111)
Creatinine, Ser: 1.27 mg/dL — ABNORMAL HIGH (ref 0.61–1.24)
GFR, Estimated: >60 mL/min (ref >60)
Glucose, Bld: 101 mg/dL — ABNORMAL HIGH (ref 70–99)
Potassium: 3.8 mmol/L (ref 3.5–5.1)
Sodium: 141 mmol/L (ref 135–145)

## 2021-02-07 LAB — HEPARIN LEVEL (UNFRACTIONATED): Heparin Unfractionated: 0.76 IU/mL — ABNORMAL HIGH (ref 0.30–0.70)

## 2021-02-07 LAB — SODIUM: Sodium: 143 mmol/L (ref 135–145)

## 2021-02-07 MED ORDER — SODIUM CHLORIDE 3 % IV SOLN: INTRAVENOUS | Status: AC

## 2021-02-07 MED ORDER — HYDRALAZINE HCL 50 MG PO TABS
100.0000 mg | ORAL_TABLET | Freq: Three times a day (TID) | ORAL | Status: DC
  Administered 2021-02-07 – 2021-02-09 (×6): 100 mg via ORAL
  Filled 2021-02-07 (×6): qty 2

## 2021-02-07 MED ORDER — HEPARIN (PORCINE) 25000 UT/250ML-% IV SOLN
1800.0000 [IU]/h | INTRAVENOUS | Status: DC
  Administered 2021-02-07: 1850 [IU]/h via INTRAVENOUS
  Administered 2021-02-08: 1800 [IU]/h via INTRAVENOUS
  Filled 2021-02-07 (×3): qty 250

## 2021-02-07 NOTE — Progress Notes (Signed)
ANTICOAGULATION CONSULT NOTE   Pharmacy Consult:  Heparin Indication:  VTE treatment  Heparin dosing weight: 104.9 kg  Labs: Recent Labs    02/05/21 0309 02/05/21 0655 02/06/21 0155 02/06/21 0741 02/06/21 1345 02/07/21 0351 02/07/21 1359 02/07/21 1611  HGB  --   --  13.2  --   --  13.4  --   --   HCT  --   --  38.7*  --   --  39.0  --   --   PLT  --   --  167  --   --  186  --   --   APTT  --    < > 69* 72*  --  59*  --  89*  HEPARINUNFRC 0.92*  --  1.00*  --   --  0.76*  --   --   CREATININE  --   --   --   --  1.35*  --  1.27*  --    < > = values in this interval not displayed.     Assessment: 89 YOM with history of CVA presented to [facility] with left arm/leg weakness. Patient transferred to [facility] for revascularization of acute CVA on 02/03/21. He was on Eliquis PTA for history of PE/DVT, last dose on 3/6 at 0830. 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 Pharmacy consulted to dose IV heparin per stroke protocol.  aPTT slightly supra-therapeutic at 89 seconds after increasing from 1750 to 1850 units/hr.  No issue with heparin infusion or bleeding reported.  Goal of Therapy:  Heparin level 0.3-0.5 units/ml aPTT 66-85 seconds Monitor platelets by anticoagulation protocol: Yes   Plan:  Decrease heparin to 1800 units/hr Monitor daily heparin level/aPTT until correlating, CBC, s/sx bleeding  Sloan Leiter, PharmD, BCPS, BCCCP Clinical Pharmacist Please refer to [facility] for [place] numbers 02/07/2021 5:30 PM

## 2021-02-07 NOTE — Progress Notes (Signed)
Off of 3%. Still requiring as needed IV blood pressure medications. On anticoagulation with heparin gtt for DVT We will keep in the ICU for 1 more night prior to transferring to the floor. Dr. Leonie Man to assume service tomorrow.   -- Amie Portland, MD Stroke Neurology Pager: [phone]

## 2021-02-07 NOTE — Progress Notes (Signed)
Physical Therapy Treatment Patient Details Name: Matthew Wise MRN: [id number] DOB: Aug 15, 1961 Today's Date: 02/07/2021    History of Present Illness The pt is a 60 yo male presenting from home on 3/6 with L-sided weakness. Imaging revealed occlusion of R M1 segment and infarct in posterior R frontoparietal region. Pt is now s/p arteriogram with revascularization of R MCA M1 segment. 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 The pt has been reccently admitted (2/22-2/28) with R MCA stroke and was d/c home. PMH includes: CVA, CKD III, DVT, HTN, PE, HLD.    PT Comments    Pt with continued progress with therapy at this time in session with focus on sit-stand transfers, posture, and midline positioning. Pt benefited from increased visual input, but did require continued cues for positioning, activation of LLE, and to maintain midline even with use of stedy and mirror for visual feedback. The pt completed multiple reps with decreasing assistance needed through session, but still struggles with activation of L quad, and requires assist/blocking to limit buckling. The pt will continue to benefit from skilled PT to progress functional strength, coordination, and stability to allow for improved independence and safety with transfers and initiation of gait. Continue to recommend CIR.   Follow Up Recommendations  CIR     Equipment Recommendations  Other (comment) (defer to post acute)    Recommendations for Other Services       Precautions / Restrictions Precautions Precautions: Fall Precaution Comments: L hemiplegia Restrictions Weight Bearing Restrictions: No    Mobility  Bed Mobility Overal bed mobility: Needs Assistance Bed Mobility: Supine to Sit     Supine to sit: Min assist;HOB elevated     General bed mobility comments: minA to BLE and to raise trunk from elevated HOB. pt followign all cues with good use of RUE    Transfers Overall transfer level: Needs  assistance Equipment used: 2 person hand held assist Transfers: Sit to/from Stand Sit to Stand: Mod assist;+2 physical assistance;+2 safety/equipment;From elevated surface Stand pivot transfers: Total assist;From elevated surface (with stedy)       General transfer comment: Mod A to minG +2 to power up into standing in stedy, improved positioning with stedy placed in front of mirror.  used stedy to transfer from EOB to recliner, requires support on R side in stedy due to poor maintenance of midline in sitting  Ambulation/Gait             General Gait Details: deferred at this time for safety, pt with absent functional use of LLE and strong L-sided lean    Modified Rankin (Stroke Patients Only) Modified Rankin (Stroke Patients Only) Pre-Morbid Rankin Score: Slight disability Modified Rankin: Severe disability     Balance Overall balance assessment: Needs assistance Sitting-balance support: Single extremity supported;Feet supported Sitting balance-Leahy Scale: Fair Sitting balance - Comments: L lean unless cued to use RUE on bed rail to steady Postural control: Left lateral lean Standing balance support: Bilateral upper extremity supported;During functional activity Standing balance-Leahy Scale: Poor Standing balance comment: hand over hand on LUE to grip onto stedy and blocking of L knee                            Cognition Arousal/Alertness: Awake/alert Behavior During Therapy: Flat affect Overall Cognitive Status: Impaired/Different from baseline Area of Impairment: Problem solving;Safety/judgement  Following Commands: Follows one step commands consistently Safety/Judgement: Decreased awareness of safety;Decreased awareness of deficits   Problem Solving: Slow processing;Decreased initiation;Requires tactile cues General Comments: pt presenting with flat affect. pt following simple commands consistently, benefits from  multimodal cues at times in relation to posutre and maintaining midline. can maintain cues only when directly cued, not maintained without cues      Exercises General Exercises - Lower Extremity Quad Sets: AROM;Both;10 reps;Supine Mini-Sqauts: AROM;Both;15 reps (from stedy flaps) Other Exercises Other Exercises: sit-stand from stedy in bathroom for visual input. Pt requires cues for posture, midline, and wt shift, but is able to make corrections with cues and hold for 5 sec    General Comments General comments (skin integrity, edema, etc.): VSS through session      Pertinent Vitals/Pain Pain Assessment: No/denies pain Pain Intervention(s): Monitored during session           PT Goals (current goals can now be found in the care plan section) Acute Rehab PT Goals Patient Stated Goal: improve mobility PT Goal Formulation: With patient Time For Goal Achievement: 02/20/21 Potential to Achieve Goals: Good Progress towards PT goals: Progressing toward goals    Frequency    Min 4X/week      PT Plan Current plan remains appropriate       AM-PAC PT "6 Clicks" Mobility   Outcome Measure  Help needed turning from your back to your side while in a flat bed without using bedrails?: A Little Help needed moving from lying on your back to sitting on the side of a flat bed without using bedrails?: A Little Help needed moving to and from a bed to a chair (including a wheelchair)?: Total Help needed standing up from a chair using your arms (e.g., wheelchair or bedside chair)?: A Lot Help needed to walk in hospital room?: Total Help needed climbing 3-5 steps with a railing? : Total 6 Click Score: 11    End of Session Equipment Utilized During Treatment: Gait belt Activity Tolerance: Patient tolerated treatment well Patient left: in chair;with call bell/phone within reach;with chair alarm set;with nursing/sitter in room Nurse Communication: Mobility status;Need for lift equipment  (stedy or lift) PT Visit Diagnosis: Muscle weakness (generalized) (M62.81);Other symptoms and signs involving the nervous system (R29.898);Other abnormalities of gait and mobility (R26.89)     Time: 2197-5883 PT Time Calculation (min) (ACUTE ONLY): 36 min  Charges:  $Therapeutic Activity: 8-22 mins $Neuromuscular Re-education: 8-22 mins                     Karma Ganja, PT, DPT   Acute Rehabilitation Department Pager #: [phone]   Otho Bellows 02/07/2021, 2:15 PM

## 2021-02-07 NOTE — Consult Note (Signed)
Physical Medicine and Rehabilitation Consult Reason for Consult: Left side weakness Referring Physician: Dr.Arora   HPI: Matthew Wise is a 60 y.o. right-handed male with history of  pulmonary emboli maintained on Eliquis, hypothyroidism, hypertension, CKD stage III, hyperlipidemia, prediabetes, sickle cell disease, tobacco abuse as well as right MCA infarction recently admitted 01/21/21-01/27/21 and discharged home ambulating 150 feet with rolling walker.  Per chart review patient lives alone.  1 level home 3 steps to entry.  Independent with assistive device.  He has a sister who checks on him regularly.  Presented 02/03/2021 with left-sided weakness of acute onset.  Cranial CT scan showed no acute intracranial hemorrhage.  Suspect acute infarct of the right frontal parietal lobe posterior superior to chronic infarct with probable involvement of lateral precentral and postcentral gyri.  Patient did not receive TPA.  CT angiogram of head and neck showed abrupt occlusion of the right M1 segment new as compared to prior CTA of 01/25/2021.  Acute core infarct of the posterior right frontal parietal region largely corresponding with a completed infarct as seen on prior MRI.  EEG showed evidence of focal seizure arising from right posterior quadrant during which patient was noted to have left upper extremity twitching.  Additionally there was cortical dysfunction in the right frontal temporal region likely due to underlying stroke.  Most recent echocardiogram with ejection fraction of 55 to 60%.  Admission chemistries unremarkable except creatinine 1.83, urine drug screen negative, hemoglobin A1c 5.9, alcohol negative.  Patient underwent arteriogram with revascularization of right MCA M1 segment per interventional radiology.  Latest follow-up MRI showed large acute right MCA territory infarct with petechial hemorrhage and minimal leftward midline shift as well as small acute left cerebellar infarct new small  acute early subacute infarct in left occipital left parietal left frontal lobes.  Bilateral lower extremity Dopplers showed findings consistent with age-indeterminate DVT involving the right common femoral vein and right femoral vein as well as left femoral vein and left popliteal vein, left common femoral and SF junction.  Patient was placed on intravenous heparin and remains on low-dose aspirin.  He has been loaded for Keppra for suspected seizure.  Maintained on a dysphagia #3 thin liquid diet.  Therapy evaluations completed due to patient's left-sided weakness physical medicine rehab consult requested. Mr. Cone is agreeable to CIR.    Review of Systems  Constitutional: Negative for chills and fever.  HENT: Negative for hearing loss.   Eyes: Negative for blurred vision and double vision.  Respiratory: Negative for cough and shortness of breath.   Cardiovascular: Positive for leg swelling. Negative for chest pain and palpitations.  Gastrointestinal: Positive for constipation. Negative for heartburn, nausea and vomiting.  Genitourinary: Negative for dysuria, flank pain and hematuria.  Musculoskeletal: Positive for joint pain and myalgias.  Skin: Negative for rash.  Neurological: Positive for weakness.  All other systems reviewed and are negative.  Past Medical History:  Diagnosis Date  . Acute CVA (cerebrovascular accident) ([place]) 09/09/2020  . Chronic anticoagulation 11/14/2014  . CKD (chronic kidney disease) stage 3, GFR 30-59 ml/min (HCC) 10/24/2019  . CKD (chronic kidney disease) stage 3, GFR 30-59 ml/min (HCC) 10/24/2019  . Clotting disorder ([place])   . 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 DVT (deep venous thrombosis) ([place]) 06/15/2011  . DVT of leg (deep venous thrombosis) (HCC)    rt. leg  . Essential hypertension 11/28/2020  .  H/O PE (pulmonary embolism) 06/15/2011  . Hyperlipidemia 11/28/2020   . 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 Prediabetes 10/24/2019  . Thyroid nodule 01/25/2021   Past Surgical History:  Procedure Laterality Date  . COLONOSCOPY WITH PROPOFOL N/A 10/29/2015   incomplete due to redundant colon. two simple tubular adenomas removed. patient was advised to go to [facility] for colonoscopy but he did not keep appt.  . IR CT HEAD LTD  02/02/2021  . IR PERCUTANEOUS ART THROMBECTOMY/INFUSION INTRACRANIAL INC DIAG ANGIO  02/02/2021  . RADIOLOGY WITH ANESTHESIA N/A 02/02/2021   Procedure: IR WITH ANESTHESIA;  Surgeon: Luanne Bras, MD;  Location: [facility];  Service: Radiology;  Laterality: N/A;  . TEE WITHOUT CARDIOVERSION N/A 09/10/2020   Procedure: TRANSESOPHAGEAL ECHOCARDIOGRAM (TEE) WITH PROPOFOL;  Surgeon: Satira Sark, MD;  Location: [facility];  Service: Cardiovascular;  Laterality: N/A;   Family History  Problem Relation Age of Onset  . COPD Father   . Stroke Brother   . Pulmonary embolism Sister   . Throat cancer Brother   . Arthritis Mother   . Diabetes Mother   . Stroke Mother   . Hypertension Brother   . Diabetes Brother   . Arthritis Brother   . Diabetes Sister   . Colon cancer Neg Hx    Social History:  reports that he has been smoking cigarettes. He started smoking about 37 years ago. He has a 30.00 pack-year smoking history. He has never used smokeless tobacco. He reports previous alcohol use of about 3.0 standard drinks of alcohol per week. He reports previous drug use. Allergies: No Known Allergies Medications Prior to Admission  Medication Sig Dispense Refill  . amLODipine (NORVASC) 5 MG tablet TAKE 1 TABLET (5 MG TOTAL) BY MOUTH DAILY. 90 tablet 0  . apixaban (ELIQUIS) 5 MG TABS tablet Take 1 tablet (5 mg total) by mouth 2 (two) times daily. 60 tablet 3  . atorvastatin (LIPITOR) 40 MG tablet TAKE 1 TABLET BY MOUTH EVERY DAY (Patient taking differently: Take 40 mg by mouth daily.) 90 tablet 0  . fluticasone (FLONASE) 50 MCG/ACT  nasal spray Place 1 spray into both nostrils daily as needed for allergies or rhinitis.    Marland Kitchen levothyroxine (SYNTHROID) 25 MCG tablet TAKE 1 TABLET BY MOUTH EVERY DAY (Patient taking differently: Take 25 mcg by mouth daily before breakfast.) 90 tablet 0  . Multiple Vitamins-Minerals (CENTRUM ADULTS PO) Take 1 tablet by mouth daily.      Home: Home Living Family/patient expects to be discharged to:: Private residence Living Arrangements: Alone Available Help at Discharge: Other (Comment) (sisters check on him regularly) Type of Home: House Home Access: Stairs to enter [employer] of Steps: 2-3 Entrance Stairs-Rails: Right Home Layout: One level Bathroom Shower/Tub: Chiropodist: Standard Bathroom Accessibility: Yes Home Equipment: Cane - quad,Shower seat  Lives With: Alone  Functional History: Prior Function Level of Independence: Independent with assistive device(s) Comments: Uses four prong cane, cooks for himself, uses scooter at grocery Functional Status:  Mobility: Bed Mobility Overal bed mobility: Needs Assistance Bed Mobility: Supine to Sit Supine to sit: Min assist,Mod assist,HOB elevated General bed mobility comments: Min A for bringing BLEs towards EOB. Min-Mod A for elevating trunk Transfers Overall transfer level: Needs assistance Equipment used: 2 person hand held assist Transfer via Lift Equipment: Stedy Transfers: Sit to/from Stand Sit to Stand: Mod assist,+2 physical assistance,+2 safety/equipment,From elevated surface Stand pivot transfers: Total assist (with stedy) General transfer comment: Mod  A +2 to power up into standing. Blocking at L knee. used stedy to transfer from EOB to recliner, requires support on R side in stedy due to poor maintenance of midline in sitting Ambulation/Gait General Gait Details: deferred at this time for safety, pt with absent functional use of LLE and strong L-sided lean    ADL: ADL Overall ADL's  : Needs assistance/impaired Eating/Feeding: Minimal assistance,Sitting Eating/Feeding Details (indicate cue type and reason): Assistance for bilateral tasks Grooming: Minimal assistance,Sitting,Moderate assistance Upper Body Bathing: Moderate assistance,Sitting Lower Body Bathing: Maximal assistance,Sit to/from stand Upper Body Dressing : Moderate assistance,Sitting Lower Body Dressing: Maximal assistance,Sit to/from stand Lower Body Dressing Details (indicate cue type and reason): Pt attempting to bend forward and pull up sock over heal but unable. Requiring Max A to don sock Functional mobility during ADLs: Moderate assistance,+2 for physical assistance (sit<>stand with and without stedy) General ADL Comments: Pt presenting with decreased balance, strength, cognition, and use of LUE.  Cognition: Cognition Overall Cognitive Status: Impaired/Different from baseline Arousal/Alertness: Awake/alert Orientation Level: Oriented X4 Attention: Focused,Sustained,Selective,Alternating Focused Attention: Appears intact Sustained Attention: Appears intact Selective Attention: Appears intact Selective Attention Impairment: Verbal basic Alternating Attention: Appears intact Memory: Impaired Memory Impairment: Decreased short term memory,Decreased recall of new information Decreased Short Term Memory: Verbal basic Awareness: Impaired Awareness Impairment: Emergent impairment Problem Solving: Impaired Problem Solving Impairment: Functional basic Executive Function: Self Monitoring,Self Correcting Self Monitoring: Impaired Self Monitoring Impairment: Verbal basic,Functional basic Safety/Judgment: Impaired Cognition Arousal/Alertness: Awake/alert Behavior During Therapy: Flat affect Overall Cognitive Status: Impaired/Different from baseline Area of Impairment: Problem solving,Safety/judgement Following Commands: Follows one step commands with increased time Safety/Judgement: Decreased  awareness of safety Problem Solving: Slow processing,Difficulty sequencing,Requires verbal cues General Comments: Presenting with flat affect. Feel possible pt is overwhelmed by situation. Pt following simple, direct cues (unrelation of L side) majority of time. Pt with inattention to L (body and environment).  Blood pressure (!) 169/81, pulse (!) 56, temperature 98.8 F (37.1 C), temperature source Axillary, resp. rate 12, height 6\' 1"  (1.854 m), weight 116.7 kg, SpO2 99 %. Physical Exam Gen: no distress, normal appearing HEENT: oral mucosa pink and moist, NCAT Cardio: Bradycardic Chest: normal effort, normal rate of breathing Abd: soft, non-distended Ext: no edema Psych: pleasant, normal affect Skin: intact Neurological:     Comments: Patient is awake and alert.  Makes eye contact with examiner.  Provides name and age.  Follows simple commands. LUE 1/5, LLE 2-3/5.    Results for orders placed or performed during the hospital encounter of 02/02/21 (from the past 24 hour(s))  Sodium     Status: None   Collection Time: 02/06/21  7:41 AM  Result Value Ref Range   Sodium 142 135 - 145 mmol/L  APTT     Status: Abnormal   Collection Time: 02/06/21  7:41 AM  Result Value Ref Range   aPTT 72 (H) 24 - 36 seconds  Comprehensive metabolic panel     Status: Abnormal   Collection Time: 02/06/21  1:45 PM  Result Value Ref Range   Sodium 142 135 - 145 mmol/L   Potassium 3.6 3.5 - 5.1 mmol/L   Chloride 111 98 - 111 mmol/L   CO2 24 22 - 32 mmol/L   Glucose, Bld 134 (H) 70 - 99 mg/dL   BUN 10 6 - 20 mg/dL   Creatinine, Ser 1.35 (H) 0.61 - 1.24 mg/dL   Calcium 9.2 8.9 - 10.3 mg/dL   Total Protein 6.9 6.5 - 8.1 g/dL  Albumin 3.3 (L) 3.5 - 5.0 g/dL   AST 25 15 - 41 U/L   ALT 20 0 - 44 U/L   Alkaline Phosphatase 73 38 - 126 U/L   Total Bilirubin 1.5 (H) 0.3 - 1.2 mg/dL   GFR, Estimated >60 >60 mL/min   Anion gap 7 5 - 15  Sodium     Status: None   Collection Time: 02/06/21  6:43 PM   Result Value Ref Range   Sodium 144 135 - 145 mmol/L  Glucose, capillary     Status: None   Collection Time: 02/06/21 11:45 PM  Result Value Ref Range   Glucose-Capillary 90 70 - 99 mg/dL  Heparin level (unfractionated)     Status: Abnormal   Collection Time: 02/07/21  3:51 AM  Result Value Ref Range   Heparin Unfractionated 0.76 (H) 0.30 - 0.70 IU/mL  CBC     Status: None   Collection Time: 02/07/21  3:51 AM  Result Value Ref Range   WBC 6.8 4.0 - 10.5 K/uL   RBC 4.70 4.22 - 5.81 MIL/uL   Hemoglobin 13.4 13.0 - 17.0 g/dL   HCT 39.0 39.0 - 52.0 %   MCV 83.0 80.0 - 100.0 fL   MCH 28.5 26.0 - 34.0 pg   MCHC 34.4 30.0 - 36.0 g/dL   RDW 13.5 11.5 - 15.5 %   Platelets 186 150 - 400 K/uL   nRBC 0.0 0.0 - 0.2 %  APTT     Status: Abnormal   Collection Time: 02/07/21  3:51 AM  Result Value Ref Range   aPTT 59 (H) 24 - 36 seconds  Sodium     Status: None   Collection Time: 02/07/21  3:51 AM  Result Value Ref Range   Sodium 143 135 - 145 mmol/L   Overnight EEG with video  Result Date: 02/05/2021 Lora Havens, MD     02/06/2021  1:29 PM Patient Name: CALEY VOLKERT MRN: [id number] Epilepsy Attending: Lora Havens Referring Physician/Provider: Dr Kerney Elbe Duration: 02/04/2021 2251 to 02/05/2021 2251   Patient history: 60 year old male with right upper extremity twitching.  EEG to evaluate for seizures.   Level of alertness: Awake, asleep   AEDs during EEG study: None   Technical aspects: This EEG study was done with scalp electrodes positioned according to the 10-20 International system of electrode placement. Electrical activity was acquired at a sampling rate of 500Hz  and reviewed with a high frequency filter of 70Hz  and a low frequency filter of 1Hz . EEG data were recorded continuously and digitally stored. Description: The posterior dominant rhythm consists of 9 Hz activity of moderate voltage (25-35 uV) seen predominantly in posterior head regions, symmetric and reactive to eye  opening and eye closing. Sleep was characterized by vertex waves, sleep spindles (12-14hz ), maximal frontocentral region. EEG showed continuous 3-5hz  theta-delta slowing in right frontotemporal region. Event button was pressed on 02/05/2021 at 0213 for left face and arm twitching. Concomitant EEG showed sharp waves in right posterior quadrant which gradually evolved in frequency and spread to involve left posterior quadrant. Two more seizures were noted without definite clinical signs arising from right posterior quadrant on 02/04/2021 2343, on 02/05/2021 0021. ABNORMALITY - Focal seizure, right posterior quadrant - Continuous slow, right frontotemporal region IMPRESSION: This study showed evidence of focal seizure arising from right posterior quadrant during which patient was noted to have left upper extremity twitching as described above. Additionally there was cortical dysfunction in right frontotemporal region likely due  to underlying stroke.   Priyanka Barbra Sarks     Assessment/Plan: Diagnosis: Occlusion of R M1 segment, infarct in posterior R frontoparietal region 1. Does the need for close, 24 hr/day medical supervision in concert with the patient's rehab needs make it unreasonable for this patient to be served in a less intensive setting? Yes 2. Co-Morbidities requiring supervision/potential complications:  1. CKDIII: monitor Cr regularly 2. DVT: continue heparin 3. HTN: monitor BP TID 4. PE 5. HLD 3. Due to bladder management, bowel management, safety, skin/wound care, disease management, medication administration, pain management and patient education, does the patient require 24 hr/day rehab nursing? Yes 4. Does the patient require coordinated care of a physician, rehab nurse, therapy disciplines of PT, OT, SLP to address physical and functional deficits in the context of the above medical diagnosis(es)? Yes Addressing deficits in the following areas: balance, endurance, locomotion, strength,  transferring, bowel/bladder control, bathing, dressing, feeding, grooming, toileting, cognition, swallowing and psychosocial support 5. Can the patient actively participate in an intensive therapy program of at least 3 hrs of therapy per day at least 5 days per week? Potentially 6. The potential for patient to make measurable gains while on inpatient rehab is excellent 7. Anticipated functional outcomes upon discharge from inpatient rehab are min assist  with PT, min assist with OT, min assist with SLP. 8. Estimated rehab length of stay to reach the above functional goals is: 14-18 days minA 9. Anticipated discharge destination: Home 10. Overall Rehab/Functional Prognosis: excellent  RECOMMENDATIONS: This patient's condition is appropriate for continued rehabilitative care in the following setting: CIR Patient has agreed to participate in recommended program. Yes Note that insurance prior authorization may be required for reimbursement for recommended care.  Comment: Thank you for this consult. Admission coordinator to follow.   I have personally performed a face to face diagnostic evaluation, including, but not limited to relevant history and physical exam findings, of this patient and developed relevant assessment and plan.  Additionally, I have reviewed and concur with the physician assistant's documentation above.  Leeroy Cha, MD   Lavon Paganini [place]field, PA-C 02/07/2021

## 2021-02-07 NOTE — TOC Initial Note (Signed)
Transition of Care [facility]) - Initial/Assessment Note    Patient Details  Name: Matthew Wise MRN: [id number] Date of Birth: [birth year]  [facility] CM/SW Contact:    Ella Bodo, RN Phone Number: 02/07/2021, 5:03 PM  Clinical Narrative:   The pt is a 60 yo male presenting from home on 3/6 with L-sided weakness. Imaging revealed occlusion of R M1 segment and infarct in posterior R frontoparietal region. Pt is now s/p arteriogram with revascularization of R MCA M1 segment. 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  PTA, pt independent with assistive devices, lives at home alone.  Patient has supportive sister who checks on him frequently.  PT/OT recommending CIR, and consult in progress.  Will continue to follow as patient progresses.  Expected Discharge Plan: IP Rehab Facility Barriers to Discharge: Continued Medical Work up          Expected Discharge Plan and Services Expected Discharge Plan: [place]   Discharge Planning Services: CM Consult   Living arrangements for the past 2 months: Single Family Home                                      Prior Living Arrangements/Services Living arrangements for the past 2 months: Single Family Home Lives with:: Self Patient language and need for interpreter reviewed:: Yes        Need for Family Participation in Patient Care: Yes (Comment)     Criminal Activity/Legal Involvement Pertinent to Current Situation/Hospitalization: No - Comment as needed  Activities of Daily Living Home Assistive Devices/Equipment: None ADL Screening (condition at time of admission) Patient's cognitive ability adequate to safely complete daily activities?: Yes Is the patient deaf or have difficulty hearing?: No Does the patient have difficulty seeing, even when wearing glasses/contacts?: No Does the patient have difficulty concentrating, remembering, or making decisions?: No Patient able to express need  for assistance with ADLs?: Yes Does the patient have difficulty dressing or bathing?: Yes Independently performs ADLs?: No Communication: Independent Dressing (OT): Needs assistance Is this a change from baseline?: Change from baseline, expected to last >3 days Grooming: Needs assistance Is this a change from baseline?: Change from baseline, expected to last >3 days Feeding: Needs assistance Is this a change from baseline?: Change from baseline, expected to last >3 days Bathing: Needs assistance Is this a change from baseline?: Change from baseline, expected to last >3 days Toileting: Needs assistance Is this a change from baseline?: Change from baseline, expected to last >3days In/Out Bed: Needs assistance Is this a change from baseline?: Change from baseline, expected to last >3 days Walks in Home: Needs assistance Is this a change from baseline?: Change from baseline, expected to last >3 days Does the patient have difficulty walking or climbing stairs?: Yes Weakness of Legs: Left Weakness of Arms/Hands: Left  Permission Sought/Granted                  Emotional Assessment Appearance:: Appears stated age   Affect (typically observed): Appropriate Orientation: : Oriented to Self,Oriented to Place,Oriented to  Time,Oriented to Situation      Admission diagnosis:  Stroke [facility]) [I63.9] Stroke (cerebrum) ([place]) [I63.9] Middle cerebral artery embolism, right [I66.01] Status post surgery [Z98.890] Cerebrovascular accident (CVA), unspecified mechanism ([place]) [I63.9] Patient Active Problem List   Diagnosis Date Noted  . Middle cerebral artery embolism, right 02/03/2021  . Status post surgery  02/02/2021  . Stroke (cerebrum) ([place]) 02/02/2021  . Hypoglycemia 01/25/2021  . Thyroid nodule 01/25/2021  . Hypothyroidism 01/25/2021  . Near syncope 01/21/2021  . H/O adenomatous polyp of colon 12/30/2020  . Acute ischemic stroke ([place]) 11/28/2020  . Essential hypertension 11/28/2020   . Mixed hyperlipidemia 11/28/2020  . Acute CVA (cerebrovascular accident) ([place]) 09/09/2020  . Transient ischemic attack 09/07/2020  . History of hypertension 09/07/2020  . Elevated troponin 09/07/2020  . Dizziness 09/07/2020  . Numbness and tingling 09/07/2020  . H/O medication noncompliance 09/07/2020  . Medicare annual wellness visit, subsequent 12/05/2019  . Nicotine dependence 12/05/2019  . CKD (chronic kidney disease) stage 3, GFR 30-59 ml/min (HCC) 10/24/2019  . Prediabetes 10/24/2019  . Pedal edema 09/14/2017  . Tubular adenoma of colon 09/14/2017  . Chronic anticoagulation 11/14/2014  . Sickle cell trait ([place]) 08/29/2012  . 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 DVT (deep venous thrombosis) ([place]) 06/15/2011  . Pulmonary embolism ([place]) 06/15/2011  . 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  Reinaldo Raddle, RN, BSN  Trauma/Neuro ICU Case Manager [phone]

## 2021-02-07 NOTE — Progress Notes (Signed)
STROKE TEAM PROGRESS NOTE   INTERVAL HISTORY  Remains on 3% saline infusion (Na 143)  and heparin infusion (hep level 0.76, PTT 59 and hgb 13.4)  Plan to wean hypertonic saline as tolerated  Stable neurologically. Hypertension to 170s with ongoing need for PRN antihypertensives and titration of oral medication regimen.  On dysphagia diet Therapists recommending CIR  Vitals:   02/07/21 0400 02/07/21 0500 02/07/21 0600 02/07/21 0700  BP: (!) 169/81 (!) 148/81 (!) 150/100 (!) 161/91  Pulse: (!) 56 (!) 57 63 (!) 55  Resp: 12 10 12 10   Temp:      TempSrc:      SpO2: 99% 98% 100% 99%  Weight:      Height:       CBC    Component Value Date/Time   WBC 6.8 02/07/2021 0351   RBC 4.70 02/07/2021 0351   HGB 13.4 02/07/2021 0351   HCT 39.0 02/07/2021 0351   PLT 186 02/07/2021 0351   MCV 83.0 02/07/2021 0351   MCH 28.5 02/07/2021 0351   MCHC 34.4 02/07/2021 0351   RDW 13.5 02/07/2021 0351   LYMPHSABS 1.4 02/04/2021 0930   MONOABS 0.9 02/04/2021 0930   EOSABS 0.1 02/04/2021 0930   BASOSABS 0.0 02/04/2021 0930   CMP Latest Ref Rng & Units 02/07/2021 02/06/2021 02/06/2021  Glucose 70 - 99 mg/dL - - 134(H)  BUN 6 - 20 mg/dL - - 10  Creatinine 0.61 - 1.24 mg/dL - - 1.35(H)  Sodium 135 - 145 mmol/L 143 144 142  Potassium 3.5 - 5.1 mmol/L - - 3.6  Chloride 98 - 111 mmol/L - - 111  CO2 22 - 32 mmol/L - - 24  Calcium 8.9 - 10.3 mg/dL - - 9.2  Total Protein 6.5 - 8.1 g/dL - - 6.9  Total Bilirubin 0.3 - 1.2 mg/dL - - 1.5(H)  Alkaline Phos 38 - 126 U/L - - 73  AST 15 - 41 U/L - - 25  ALT 0 - 44 U/L - - 20    IMAGING past 24 hours  PHYSICAL EXAM General: Awake alert in no distress HEENT: Normocephalic, atraumatic Cardiovascular: Regular rhythm Respiratory: No extra work of breathing on room air Extremities: Appear warm and well perfused with bilateral lower extremity swelling. Neurological exam Awake alert oriented x4 Speech clear and appropriate in context Reduced attention  concentration Cranial nerves: Pupils equal round react light, extraocular movements intact, visual fields appear full, face appears asymmetric with left lower facial weakness, left homonymous hemianopsia noted on exam. Motor examination: Left upper extremity 1/5, left lower extremity 2/5-3/5.  Right upper and lower extremity are full strength. Sensation diminished on the left and also extinction on double simultaneous stimulation. Coordination: No dysmetria on the right.  Unable to perform the left.  ASSESSMENT/PLAN Matthew Wise is a 60 y.o. male with history of hypertension, hyperlipidemia, DVT/PE on Eliquis, sickle cell disease presenting with sudden onset of left arm and leg weakness along with left facial droop with a last known well of 1500 hrs. on 02/02/2021.  Seen by telemedicine neurology and transported to [facility] for higher level of care due to right MCA M1 filling deficit and perfusion deficit on perfusion imaging-see details below.  Patient was seen in December/January as well as again in February for right MCA strokes-first 1 in the right M1 superior division and the second 1 involving patchy distribution of both superior and inferior division of the right MCAs.  This time around, CTA showed no  abrupt occlusion of the right M1 segment which is new as compared to the prior CTA from February 2022 suggesting underlying stenosis on the prior CTA that probably now occluded. There was an acute core infarct in the posterior right frontoparietal region likely corresponding with the completed infarct as seen on prior MRI but there was presumably penumbra of 57 cc considered tissue at risk for which he was taken for emergent IR thrombectomy.  MRI - large acute RMCA infarct with peticheal hemorrhage, minimal leftward shift, small acute left cerebellar infarct, new small acute/subacute left occipital, left parietal and left frontal lobe infarcts. Could be periprocedural but very  suspicious for a cardioembolic etiology.  Stroke-Large right MCA stroke likely atheroembolic from large vessel arterial sclerosis-question moyamoya like phenomenology or some other form of arteriopathy.  Code Stroke CT head no acute ICH.  Suspect acute infarction of the right frontoparietal lobes posterior superior to chronic infarct with probable involvement of lateral precentral and postcentral gyri.  CTA head & neck-abrupt right M1 segment occlusion new as compared to CTA from 01/25/2021.  This is suggestive of an underlying stenosis at this level on prior CTA from 01/25/2021.  Acute cord infarct at the posterior right frontoparietal region likely corresponding with the completed infarct as seen on prior MRI.  This may have partially expanded in the interim.  Surrounding ischemic penumbra 57 cc-consider tissue address.  Fetal PCA bilaterally and overall diminutive vertebrobasilar system.  Technically limited by contrast bolus  CT perfusion -57 cc penumbra-in hindsight likely overestimating.  CT head after the procedure 9 AM today-progressive nonhemorrhagic cortical infarct in the posterior right MCA territory with further loss of gray-white differentiation.  Progressive edema and mass-effect with 2 to 3 mm midline shift.  Hyperdense areas in the sulci likely reflective of pseudosubarachnoid hemorrhage with areas of cortex adjacent to 1 another.  No definitive hemorrhage is present.  Stable remote infarct of the anterior right MCA territory.  MRI shows large acute RMCA infarct with peticheal hemorrhage, minimal leftward shift, small acute left cerebellar infarct, new small acute/subacute left occipital, left parietal and left frontal lobe infarcts.  Could be periprocedural but very suspicious for a cardioembolic etiology-also due to increased LA size seen on TEE.  2D Echo completed on 01/22/2021 with LVEF 55 to 60%, left ventricle with regional wall motion abnormalities.  Left ventricular  hypertrophy-mild.  Diastolic parameters of the left ventricle indeterminate.  Right ventricular systolic function normal.  Mitral valve normal.  Aortic valve is tricuspid and normal.  Left atrium normal.  TEE 08/2020 - no shunt, no appendage clot, no LV thrombus, mild dilated LA size   LDL 25  HgbA1c 5.9  VTE prophylaxis -SCDs for now.  May start chemical prophylaxis after MRI is completed and shows no large bleed.    Diet   DIET DYS 3 Room service appropriate? Yes; Fluid consistency: Thin    Eliquis (apixaban) daily prior to admission, now on  low-dose aspirin and heparin drip for DVT.  Therapy recommendations: CIR  Disposition: CIR Pending medical readiness, acceptance and insurance approval  Cerebral Edema-expected with the large size of the stroke, with subtle worsening on repeat  imaging 3/8 with increased midline shift. -Repeat CT head 3/8 shows increased size of large right MCA territory infarct with unchanged subarachnoid hemorrhage. There is increased mass effect on the right lateral ventricle and 3 mm of left midline shift. A small left cerebellar infarct is also noted.  -On HTS --started 3/9. Will wean HTS today with goal  sodium of normal 135-145. Discussed with bedside RN.  Consider transferring to the floor after weaning off 3%.  Seizure-like activity noted clinically and confirmed on EEG. -Left upper extremity shaking -EEG concerning for multiple seizures.   -Loaded with Keppra 3 g followed by Keppra 500 twice daily on 3/9 --Good control of seizures as noted on  EEG with no sz. LTM d/ced 3/10 - Continue Keppra   Hypertension, Hypertensive urgency -Amlodipine-10 mg daily.  Lisinopril titrated to 40 mg daily.  Hydralazine titrated to 100mg  TID 0/93 -Systolic blood pressure goal less than 160 -Has not required Cleviprex but has required multiple doses of PRN labetalol injections to keep blood pressure below 160. -Goal SBP <160  -Long-term BP goal normotensive -Monitor  kidney function with daily BMP -If BP at goal for 8 hours will consider transfer to tele floor.   Hyperlipidemia -Home meds:  Lipitor 40 -LDL 25, goal < 70 -Will reduce to Lipitor 20 -Continue statin at discharge  Leg swelling -h/o DVT bilat LE dopplers +right common femoral vein, and right femoral vein. left femoral vein, and left popliteal vein.  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  -On heparin drip d/t history of DVT, presence of the chronic DVTs as well as personal history of PE. This is with the understanding that he is at high risk for bleeding. -heparin level 0.76 -Likely change to PO DOAC tomorrow.  CKD 3 -Ctn baseline around 1.66 -Ctn 1.83->1.72->1.64->1.35 -Monitor with daily BMP as BP regimen is titrated  In case of any neuro change, very low threshold for repeat head CT.  Other Stroke Risk Factors  Cigarette smoker advised to stop smoking  Obesity, Body mass index is 33.94 kg/m., BMI >/= 30 associated with increased stroke risk, recommend weight loss, diet and exercise as appropriate   Hx stroke/TIA  OSA   Other risk factors  HgbA1c 5.9, goal < 7.0   Consider transfer to floor after weaning off 3%. Consider DOAC initiation tomorrow.  Hospital day # 5  Attending Neurohospitalist Addendum Patient seen and examined with APP/Resident. Agree with the history and physical as documented above. Agree with the plan as documented, which I helped formulate. I have independently reviewed the chart, obtained history, review of systems and examined the patient.I have personally reviewed pertinent head/neck/spine imaging (CT/MRI). Please feel free to call with any questions.  -- Amie Portland, MD Stroke Neurology Pager: [phone]    CRITICAL CARE ATTESTATION Performed by: Amie Portland, MD Total critical care time: 33 minutes Critical care time was exclusive of separately billable  procedures and treating other patients and/or supervising APPs/Residents/Students Critical care was necessary to treat or prevent imminent or life-threatening deterioration due to acute ischemic stroke status post endovascular revascularization, DVTs, hypertensive urgency, high likelihood of recurrent stroke This patient is critically ill and at significant risk for neurological worsening and/or death and care requires constant monitoring. Critical care was time spent personally by me on the following activities: development of treatment plan with patient and/or surrogate as well as nursing, discussions with consultants, evaluation of patient's response to treatment, examination of patient, obtaining history from patient or surrogate, ordering and performing treatments and interventions, ordering and review of laboratory studies, ordering and review of radiographic studies, pulse oximetry, re-evaluation of patient's condition, participation in multidisciplinary rounds and medical decision making of high complexity in the care of this patient.

## 2021-02-07 NOTE — Progress Notes (Signed)
Inpatient Rehabilitation Admissions Coordinator  I met at beside with patient and spoke with his sister, Deneise Lever, by phone . We discussed goals and expectations of a possible CIR admit.Patient lives alone, but Deneise Lever to clarify family supports available after any rehab venue. I will follow up on Monday to assess his progress and clarify with Deneise Lever eventual caregiver supports after she speaks with family this weekend.  Danne Baxter, RN, MSN Rehab Admissions Coordinator [phone] 02/07/2021 2:54 PM

## 2021-02-07 NOTE — Progress Notes (Signed)
ANTICOAGULATION CONSULT NOTE   Pharmacy Consult:  Heparin Indication:  VTE treatment  Heparin dosing weight: 104.9 kg  Labs: Recent Labs    02/04/21 0930 02/04/21 2301 02/05/21 0309 02/05/21 0655 02/06/21 0155 02/06/21 0741 02/06/21 1345 02/07/21 0351  HGB 12.8*  --   --   --  13.2  --   --  13.4  HCT 36.1*  --   --   --  38.7*  --   --  39.0  PLT 194  --   --   --  167  --   --  186  APTT  --    < >  --    < > 69* 72*  --  59*  HEPARINUNFRC  --    < > 0.92*  --  1.00*  --   --  0.76*  CREATININE 1.64*  --   --   --   --   --  1.35*  --    < > = values in this interval not displayed.     Assessment: 59 YOM with history of CVA presented to [facility] with left arm/leg weakness. Patient transferred to [facility] for revascularization of acute CVA on 02/03/21. He was on Eliquis PTA for history of PE/DVT, last dose on 3/6 at 0830. 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 Pharmacy consulted to dose IV heparin per stroke protocol.  aPTT subtherapeutic this AM at 59 seconds. Heparin level remains elevated, as expected, due to influence of apixaban. No issue with heparin infusion or bleeding reported per discussion with RN.  Goal of Therapy:  Heparin level 0.3-0.5 units/ml aPTT 66-85 seconds Monitor platelets by anticoagulation protocol: Yes   Plan:  Increase heparin to 1850 units/hr Check 6hr aPTT Monitor daily heparin level/aPTT until correlating, CBC, s/sx bleeding   Arturo Morton, PharmD, BCPS Please check AMION for all [place] contact numbers Clinical Pharmacist 02/07/2021 7:53 AM

## 2021-02-08 DIAGNOSIS — I6601 Occlusion and stenosis of right middle cerebral artery: Secondary | ICD-10-CM | POA: Diagnosis not present

## 2021-02-08 LAB — BASIC METABOLIC PANEL
Anion gap: 9 (ref 5–15)
BUN: 9 mg/dL (ref 6–20)
CO2: 25 mmol/L (ref 22–32)
Calcium: 9.4 mg/dL (ref 8.9–10.3)
Chloride: 104 mmol/L (ref 98–111)
Creatinine, Ser: 1.27 mg/dL — ABNORMAL HIGH (ref 0.61–1.24)
GFR, Estimated: >60 mL/min (ref >60)
Glucose, Bld: 96 mg/dL (ref 70–99)
Potassium: 3.2 mmol/L — ABNORMAL LOW (ref 3.5–5.1)
Sodium: 138 mmol/L (ref 135–145)

## 2021-02-08 LAB — CBC
HCT: 37.5 % — ABNORMAL LOW (ref 39.0–52.0)
Hemoglobin: 12.8 g/dL — ABNORMAL LOW (ref 13.0–17.0)
MCH: 28.4 pg (ref 26.0–34.0)
MCHC: 34.1 g/dL (ref 30.0–36.0)
MCV: 83.1 fL (ref 80.0–100.0)
Platelets: 201 10*3/uL (ref 150–400)
RBC: 4.51 MIL/uL (ref 4.22–5.81)
RDW: 13.2 % (ref 11.5–15.5)
WBC: 5.9 10*3/uL (ref 4.0–10.5)
nRBC: 0 % (ref 0.0–0.2)

## 2021-02-08 LAB — APTT: aPTT: 80 seconds — ABNORMAL HIGH (ref 24–36)

## 2021-02-08 LAB — HEPARIN LEVEL (UNFRACTIONATED): Heparin Unfractionated: 0.74 IU/mL — ABNORMAL HIGH (ref 0.30–0.70)

## 2021-02-08 MED ORDER — POTASSIUM CHLORIDE CRYS ER 20 MEQ PO TBCR
40.0000 meq | EXTENDED_RELEASE_TABLET | Freq: Once | ORAL | Status: AC
  Administered 2021-02-08: 40 meq via ORAL
  Filled 2021-02-08: qty 2

## 2021-02-08 NOTE — Progress Notes (Signed)
STROKE TEAM PROGRESS NOTE   INTERVAL HISTORY Hypertonic saline weaned off.  Continues on heparin infusion (hep level 0.74, PTT 80 and hgb 12.8)  Stable neurologically. Hypertension to improved.  On dysphagia diet Therapists recommending CIR  Denies new complaints or concerns today. Stroke diagnosis discussed and plan of care re-iterated including transfer to floor most likely today if beds are available. No questions today.  Patient appears to be at risk for sleep apnea due to his body habitus.  He admits to snoring.  He is willing to participate in the sleep smart study  Vitals:   02/08/21 0324 02/08/21 0400 02/08/21 0600 02/08/21 0700  BP:  (!) 167/80 (!) 157/86 (!) 159/90  Pulse:  (!) 55 (!) 50 (!) 56  Resp:  (!) 9 16 12   Temp: 98.7 F (37.1 C)     TempSrc:      SpO2:  99% 100% 98%  Weight:      Height:       CBC    Component Value Date/Time   WBC 5.9 02/08/2021 0347   RBC 4.51 02/08/2021 0347   HGB 12.8 (L) 02/08/2021 0347   HCT 37.5 (L) 02/08/2021 0347   PLT 201 02/08/2021 0347   MCV 83.1 02/08/2021 0347   MCH 28.4 02/08/2021 0347   MCHC 34.1 02/08/2021 0347   RDW 13.2 02/08/2021 0347   LYMPHSABS 1.4 02/04/2021 0930   MONOABS 0.9 02/04/2021 0930   EOSABS 0.1 02/04/2021 0930   BASOSABS 0.0 02/04/2021 0930   CMP Latest Ref Rng & Units 02/07/2021 02/07/2021 02/06/2021  Glucose 70 - 99 mg/dL 101(H) - -  BUN 6 - 20 mg/dL 9 - -  Creatinine 0.61 - 1.24 mg/dL 1.27(H) - -  Sodium 135 - 145 mmol/L 141 143 144  Potassium 3.5 - 5.1 mmol/L 3.8 - -  Chloride 98 - 111 mmol/L 108 - -  CO2 22 - 32 mmol/L 23 - -  Calcium 8.9 - 10.3 mg/dL 9.4 - -  Total Protein 6.5 - 8.1 g/dL - - -  Total Bilirubin 0.3 - 1.2 mg/dL - - -  Alkaline Phos 38 - 126 U/L - - -  AST 15 - 41 U/L - - -  ALT 0 - 44 U/L - - -    IMAGING past 24 hours  PHYSICAL EXAM General: Pleasant obese middle-aged African-American male awake alert in no distress HEENT: Normocephalic, atraumatic Cardiovascular:  Regular rhythm Respiratory: No extra work of breathing on room air Extremities: Appear warm and well perfused with bilateral lower extremity swelling. Neurological exam Awake alert oriented x4 Speech clear and appropriate in context Reduced attention concentration Cranial nerves: Pupils equal round react light, extraocular movements intact, right gaze deviation but can able to look to the left past midline.  Face appears asymmetric with left lower facial weakness, left homonymous hemianopsia noted on exam. Motor examination: Left upper extremity 1/5, left lower extremity 2/5-3/5.  Right upper and lower extremity are full strength. Sensation diminished on the left and also extinction on double simultaneous stimulation. Coordination: No dysmetria on the right.  Unable to perform the left.  NIHSS 9  Premorbid MRs 0  ASSESSMENT/PLAN Mr. TEMPLE SPORER is a 60 y.o. male with history of hypertension, hyperlipidemia, DVT/PE on Eliquis, sickle cell disease presenting with sudden onset of left arm and leg weakness along with left facial droop with a last known well of 1500 hrs. on 02/02/2021.  Seen by telemedicine neurology and transported to [facility] for higher level  of care due to right MCA M1 filling deficit and perfusion deficit on perfusion imaging-see details below.  Patient was seen in December/January as well as again in February for right MCA strokes-first 1 in the right M1 superior division and the second 1 involving patchy distribution of both superior and inferior division of the right MCAs. Positive TCD Bubble study with delayed HITS noted after valasava  indicative of a small intrapulmonary shunt likely with further investigation of CT chest showing no PE, AVM or fistula.    This time around, CTA showed   abrupt occlusion of the right M1 segment which is new as compared to the prior CTA from February 2022 suggesting underlying stenosis on the prior CTA that probably now  occluded. There was an acute core infarct in the posterior right frontoparietal region likely corresponding with the completed infarct as seen on prior MRI but there was presumably penumbra of 57 cc considered tissue at risk for which he was taken for emergent IR thrombectomy.  MRI - large acute RMCA infarct with peticheal hemorrhage, minimal leftward shift, small acute left cerebellar infarct, new small acute/subacute left occipital, left parietal and left frontal lobe infarcts. Could be periprocedural but very suspicious for a cardioembolic etiology.  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  Suspect acute infarction of the right frontoparietal lobes posterior superior to chronic infarct with probable involvement of lateral precentral and postcentral gyri.  CTA head & neck-abrupt right M1 segment occlusion new as compared to CTA from 01/25/2021.  This is suggestive of an underlying stenosis at this level on prior CTA from 01/25/2021.  Acute cord infarct at the posterior right frontoparietal region likely corresponding with the completed infarct as seen on prior MRI.  This may have partially expanded in the interim.  Surrounding ischemic penumbra 57 cc-consider tissue address.  Fetal PCA bilaterally and overall diminutive vertebrobasilar system.  Technically limited by contrast bolus  CT perfusion -57 cc penumbra-in hindsight likely overestimating.  CT head after the procedure 9 AM today-progressive nonhemorrhagic cortical infarct in the posterior right MCA territory with further loss of gray-white differentiation.  Progressive edema and mass-effect with 2 to 3 mm midline shift.  Hyperdense areas in the sulci likely reflective of pseudosubarachnoid hemorrhage with areas of cortex adjacent to 1 another.  No definitive hemorrhage is present.  Stable remote infarct of the anterior right MCA territory.  MRI shows large acute RMCA  infarct with peticheal hemorrhage, minimal leftward shift, small acute left cerebellar infarct, new small acute/subacute left occipital, left parietal and left frontal lobe infarcts.  Could be periprocedural but very suspicious for a cardioembolic etiology-also due to increased LA size seen on TEE.  2D Echo completed on 01/22/2021 with LVEF 55 to 60%, left ventricle with regional wall motion abnormalities.  Left ventricular hypertrophy-mild.  Diastolic parameters of the left ventricle indeterminate.  Right ventricular systolic function normal.  Mitral valve normal.  Aortic valve is tricuspid and normal.  Left atrium normal.  LDL 25  HgbA1c 5.9  VTE prophylaxis -SCDs for now.  May start chemical prophylaxis after MRI is completed and shows no large bleed.    Diet   DIET DYS 3 Room service appropriate? Yes; Fluid consistency: Thin    Eliquis (apixaban) daily prior to admission, now on  low-dose aspirin and heparin drip for DVT. Consider DOAC initiation tomorrow 3/13  Therapy recommendations: CIR  Disposition: CIR Pending medical readiness, acceptance and insurance  approval  Cerebral Edema-expected with the large size of the stroke, with subtle worsening on repeat  imaging 3/8 with increased midline shift. -Repeat CT head 3/8 shows increased size of large right MCA territory infarct with unchanged subarachnoid hemorrhage. There is increased mass effect on the right lateral ventricle and 3 mm of left midline shift. A small left cerebellar infarct is also noted.  -Weaned off HTS On 3/11  Seizure-like activity noted clinically and confirmed on EEG. -Left upper extremity shaking -EEG concerning for multiple seizures.   -Loaded with Keppra 3 g followed by Keppra 500 twice daily on 3/9 --Good control of seizures as noted on  EEG with no sz. LTM d/ced 3/10 - Continue Keppra   Hypertension, Hypertensive urgency -Amlodipine-10 mg daily.  Lisinopril titrated to 40 mg daily.  Hydralazine titrated  to 100mg  TID 3/11  -Goal SBP <160  -Long-term BP goal normotensive -Monitor kidney function with daily BMP -If BP at goal for 8 hours will consider transfer to tele floor.   Hyperlipidemia -Home meds:  Lipitor 40 -LDL 25, goal < 70 -Will reduce to Lipitor 20 -Continue statin at discharge  Leg swelling -h/o DVT bilat LE dopplers +right common femoral vein, and right femoral vein. left femoral vein, and left popliteal vein.  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  -On heparin drip d/t history of DVT, presence of the chronic DVTs as well as personal history of PE. This is with the understanding that he is at high risk for bleeding. -heparin level 0.76 -Likely change to PO DOAC tomorrow.  CKD 3 -Ctn baseline around 1.66 -Ctn 1.83->1.72->1.64->1.35->1.27 -Monitor with daily BMP as BP regimen is titrated  Hypokalemia Monitor and replete  3.8->3.2    Other Stroke Risk Factors  Cigarette smoker advised to stop smoking  Obesity, Body mass index is 33.94 kg/m., BMI >/= 30 associated with increased stroke risk, recommend weight loss, diet and exercise as appropriate   Hx stroke/TIA  OSA   Other risk factors  HgbA1c 5.9, goal < 7.0  I have personally obtained history,examined this patient, reviewed notes, independently viewed imaging studies, participated in medical decision making and plan of care.ROS completed by me personally and pertinent positives fully documented  I have made any additions or clarifications directly to the above note. Agree with note above.  Patient seems to be neurologically stable but still has significant left hemiparesis and visual field loss.  He also appears to be at risk for sleep apnea and is interested in participating in the sleep smart study.  We will give him information to review and decide.  Mobilize out of bed.  Transfer to neurology floor bed.  Will resume  anticoagulation with Eliquis tomorrow and stop heparin..This patient is critically ill and at significant risk of neurological worsening, death and care requires constant monitoring of vital signs, hemodynamics,respiratory and cardiac monitoring, extensive review of multiple databases, frequent neurological assessment, discussion with family, other specialists and medical decision making of high complexity.I have made any additions or clarifications directly to the above note.This critical care time does not reflect procedure time, or teaching time or supervisory time of PA/NP/Med Resident etc but could involve care discussion time.  I spent 30 minutes of neurocritical care time  in the care of  this patient.      Antony Contras, MD Medical Director [place] Pager: [phone] 02/08/2021 1:46 PM   Hospital day # 6

## 2021-02-08 NOTE — Progress Notes (Signed)
ANTICOAGULATION CONSULT NOTE  Pharmacy Consult:  Heparin Indication:  VTE treatment  Heparin dosing weight: 104.9 kg  Labs: Recent Labs    02/06/21 0155 02/06/21 0741 02/06/21 1345 02/07/21 0351 02/07/21 1359 02/07/21 1611 02/08/21 0347 02/08/21 0925  HGB 13.2  --   --  13.4  --   --  12.8*  --   HCT 38.7*  --   --  39.0  --   --  37.5*  --   PLT 167  --   --  186  --   --  201  --   APTT 69*   < >  --  59*  --  89* 80*  --   HEPARINUNFRC 1.00*  --   --  0.76*  --   --  0.74*  --   CREATININE  --   --  1.35*  --  1.27*  --   --  1.27*   < > = values in this interval not displayed.     Assessment: 60 YOM with history of CVA presented to [facility] with left arm/leg weakness. Patient transferred to [facility] for revascularization of acute CVA on 02/03/21. He was on Eliquis PTA for history of PE/DVT, last dose on 3/6 at 0830. 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 Pharmacy consulted to dose IV heparin per stroke protocol.  aPTT therapeutic at 80 seconds.  Heparin level is still elevated due to the influence of Eliquis.  No issue with heparin infusion or bleeding reported.  Goal of Therapy:  Heparin level 0.3-0.5 units/ml aPTT 66-85 seconds Monitor platelets by anticoagulation protocol: Yes   Plan:  Continue heparin gtt at 1800 units/hr Monitor daily heparin level/aPTT until correlating, CBC, s/sx bleeding  Eh Sesay D. Mina Marble, PharmD, BCPS, [place] 02/08/2021, 10:46 AM

## 2021-02-09 DIAGNOSIS — I6601 Occlusion and stenosis of right middle cerebral artery: Secondary | ICD-10-CM | POA: Diagnosis not present

## 2021-02-09 LAB — CBC
HCT: 41.5 % (ref 39.0–52.0)
Hemoglobin: 14.1 g/dL (ref 13.0–17.0)
MCH: 28.7 pg (ref 26.0–34.0)
MCHC: 34 g/dL (ref 30.0–36.0)
MCV: 84.3 fL (ref 80.0–100.0)
Platelets: 214 10*3/uL (ref 150–400)
RBC: 4.92 MIL/uL (ref 4.22–5.81)
RDW: 13.3 % (ref 11.5–15.5)
WBC: 4.6 10*3/uL (ref 4.0–10.5)
nRBC: 0 % (ref 0.0–0.2)

## 2021-02-09 LAB — BASIC METABOLIC PANEL
Anion gap: 9 (ref 5–15)
BUN: 14 mg/dL (ref 6–20)
CO2: 26 mmol/L (ref 22–32)
Calcium: 9.6 mg/dL (ref 8.9–10.3)
Chloride: 104 mmol/L (ref 98–111)
Creatinine, Ser: 1.59 mg/dL — ABNORMAL HIGH (ref 0.61–1.24)
GFR, Estimated: 50 mL/min — ABNORMAL LOW (ref >60)
Glucose, Bld: 87 mg/dL (ref 70–99)
Potassium: 3.4 mmol/L — ABNORMAL LOW (ref 3.5–5.1)
Sodium: 139 mmol/L (ref 135–145)

## 2021-02-09 LAB — MAGNESIUM: Magnesium: 2 mg/dL (ref 1.7–2.4)

## 2021-02-09 LAB — APTT: aPTT: 83 seconds — ABNORMAL HIGH (ref 24–36)

## 2021-02-09 LAB — PHOSPHORUS: Phosphorus: 3.9 mg/dL (ref 2.5–4.6)

## 2021-02-09 LAB — HEPARIN LEVEL (UNFRACTIONATED): Heparin Unfractionated: 0.68 IU/mL (ref 0.30–0.70)

## 2021-02-09 MED ORDER — APIXABAN 5 MG PO TABS
5.0000 mg | ORAL_TABLET | Freq: Two times a day (BID) | ORAL | Status: DC
  Administered 2021-02-09: 5 mg via ORAL
  Filled 2021-02-09: qty 1

## 2021-02-09 MED ORDER — POTASSIUM CHLORIDE CRYS ER 20 MEQ PO TBCR
40.0000 meq | EXTENDED_RELEASE_TABLET | Freq: Once | ORAL | Status: AC
  Administered 2021-02-09: 40 meq via ORAL
  Filled 2021-02-09: qty 2

## 2021-02-09 MED ORDER — DABIGATRAN ETEXILATE MESYLATE 150 MG PO CAPS
150.0000 mg | ORAL_CAPSULE | Freq: Two times a day (BID) | ORAL | Status: DC
  Administered 2021-02-09 – 2021-02-12 (×6): 150 mg via ORAL
  Filled 2021-02-09 (×7): qty 1

## 2021-02-09 MED ORDER — SODIUM CHLORIDE 0.9 % IV BOLUS
1000.0000 mL | Freq: Once | INTRAVENOUS | Status: AC
  Administered 2021-02-09: 1000 mL via INTRAVENOUS

## 2021-02-09 MED ORDER — HYDRALAZINE HCL 50 MG PO TABS
75.0000 mg | ORAL_TABLET | Freq: Three times a day (TID) | ORAL | Status: DC
  Administered 2021-02-09 – 2021-02-12 (×8): 75 mg via ORAL
  Filled 2021-02-09 (×8): qty 1

## 2021-02-09 NOTE — Progress Notes (Signed)
STROKE TEAM PROGRESS NOTE   INTERVAL HISTORY Continues on heparin infusion this morning. (hep level 0.68, PTT 83).  Will transition to Pradaxa today.  Stable neurologically. Afebrile. Vital signs stable beside bradycardia. On dysphagia diet Therapists recommending CIR  Patient did participate in the sleep smart study.  He had overnight NOx 3 monitor but study was suboptimal as patient did not wear it for long enough and he will need to repeat it today.  No neurological changes.  Vital signs stable.  Vitals:   02/09/21 0430 02/09/21 0731 02/09/21 1201 02/09/21 1201  BP: 120/67 126/71 111/70 111/70  Pulse: (!) 54 (!) 58 (!) 57 (!) 57  Resp: 17 16 16 16   Temp: 98.2 F (36.8 C) 98.2 F (36.8 C) 98.2 F (36.8 C) 98.2 F (36.8 C)  TempSrc:  Oral Oral Oral  SpO2: 99% 97% 100% 100%  Weight:      Height:       CBC    Component Value Date/Time   WBC 5.9 02/08/2021 0347   RBC 4.51 02/08/2021 0347   HGB 12.8 (L) 02/08/2021 0347   HCT 37.5 (L) 02/08/2021 0347   PLT 201 02/08/2021 0347   MCV 83.1 02/08/2021 0347   MCH 28.4 02/08/2021 0347   MCHC 34.1 02/08/2021 0347   RDW 13.2 02/08/2021 0347   LYMPHSABS 1.4 02/04/2021 0930   MONOABS 0.9 02/04/2021 0930   EOSABS 0.1 02/04/2021 0930   BASOSABS 0.0 02/04/2021 0930   CMP Latest Ref Rng & Units 02/08/2021 02/07/2021 02/07/2021  Glucose 70 - 99 mg/dL 96 101(H) -  BUN 6 - 20 mg/dL 9 9 -  Creatinine 0.61 - 1.24 mg/dL 1.27(H) 1.27(H) -  Sodium 135 - 145 mmol/L 138 141 143  Potassium 3.5 - 5.1 mmol/L 3.2(L) 3.8 -  Chloride 98 - 111 mmol/L 104 108 -  CO2 22 - 32 mmol/L 25 23 -  Calcium 8.9 - 10.3 mg/dL 9.4 9.4 -  Total Protein 6.5 - 8.1 g/dL - - -  Total Bilirubin 0.3 - 1.2 mg/dL - - -  Alkaline Phos 38 - 126 U/L - - -  AST 15 - 41 U/L - - -  ALT 0 - 44 U/L - - -    IMAGING past 24 hours  PHYSICAL EXAM General: Pleasant obese middle-aged African-American male awake alert in no distress HEENT: Normocephalic,  atraumatic Cardiovascular: Regular rhythm Respiratory: No extra work of breathing on room air Extremities: Appear warm and well perfused with bilateral lower extremity swelling. Neurological exam Awake alert oriented x4 Speech clear and appropriate in context Reduced attention concentration Cranial nerves: Pupils equal round react light, extraocular movements intact, right gaze deviation but is able to look to the left past midline.  Face appears asymmetric with left lower facial weakness, left homonymous hemianopsia noted on exam. Motor examination: Left upper extremity 1/5, left lower extremity 3/5 (drift).  Right upper and lower extremity are full strength. Sensation diminished on the left and also extinction on double simultaneous stimulation. Coordination: No dysmetria on the right.  Unable to perform the left.  ASSESSMENT/PLAN Matthew Wise is a 60 y.o. male with history of hypertension, hyperlipidemia, DVT/PE on Eliquis, sickle cell disease presenting with sudden onset of left arm and leg weakness along with left facial droop with a last known well of 1500 hrs. on 02/02/2021.  Seen by telemedicine neurology and transported to [facility] for higher level of care due to right MCA M1 filling deficit and perfusion deficit on  perfusion imaging-see details below.  Patient was seen in December/January as well as again in February for right MCA strokes-first 1 in the right M1 superior division and the second 1 involving patchy distribution of both superior and inferior division of the right MCAs. Positive TCD Bubble study with delayed HITS noted after valasava  indicative of a small intrapulmonary shunt likely with further investigation of CT chest showing no PE, AVM or fistula.    This time around, CTA showed abrupt occlusion of the right M1 segment which is new as compared to the prior CTA from February 2022 suggesting underlying stenosis on the prior CTA that probably now  occluded. There was an acute core infarct in the posterior right frontoparietal region likely corresponding with the completed infarct as seen on prior MRI but there was presumably penumbra of 57 cc considered tissue at risk for which he was taken for emergent IR thrombectomy.  MRI - large acute RMCA infarct with peticheal hemorrhage, minimal leftward shift, small acute left cerebellar infarct, new small acute/subacute left occipital, left parietal and left frontal lobe infarcts. Could be periprocedural but very suspicious for a cardioembolic etiology.  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  Suspect acute infarction of the right frontoparietal lobes posterior superior to chronic infarct with probable involvement of lateral precentral and postcentral gyri.  CTA head & neck-abrupt right M1 segment occlusion new as compared to CTA from 01/25/2021.  This is suggestive of an underlying stenosis at this level on prior CTA from 01/25/2021.  Acute cord infarct at the posterior right frontoparietal region likely corresponding with the completed infarct as seen on prior MRI.  This may have partially expanded in the interim.  Surrounding ischemic penumbra 57 cc-consider tissue address.  Fetal PCA bilaterally and overall diminutive vertebrobasilar system.  Technically limited by contrast bolus  CT perfusion -57 cc penumbra-in hindsight likely overestimating.  CT head after the procedure 9 AM today-progressive nonhemorrhagic cortical infarct in the posterior right MCA territory with further loss of gray-white differentiation.  Progressive edema and mass-effect with 2 to 3 mm midline shift.  Hyperdense areas in the sulci likely reflective of pseudosubarachnoid hemorrhage with areas of cortex adjacent to 1 another.  No definitive hemorrhage is present.  Stable remote infarct of the anterior right MCA territory.  MRI shows large acute RMCA  infarct with peticheal hemorrhage, minimal leftward shift, small acute left cerebellar infarct, new small acute/subacute left occipital, left parietal and left frontal lobe infarcts.  Could be periprocedural but very suspicious for a cardioembolic etiology-also due to increased LA size seen on TEE.  2D Echo completed on 01/22/2021 with LVEF 55 to 60%, left ventricle with regional wall motion abnormalities.  Left ventricular hypertrophy-mild.  Diastolic parameters of the left ventricle indeterminate.  Right ventricular systolic function normal.  Mitral valve normal.  Aortic valve is tricuspid and normal.  Left atrium normal.  LDL 25  HgbA1c 5.9  VTE prophylaxis -SCDs for now.  May start chemical prophylaxis after MRI is completed and shows no large bleed.    Diet   DIET DYS 3 Room service appropriate? Yes; Fluid consistency: Thin    Eliquis (apixaban) daily prior to admission, on low-dose aspirin and will discontinue heparin drip for DVT. Started on Pradaxa 150mg  bid on 02/09/2021 and continue Aspirin 81mg  daily  Therapy recommendations: CIR  Disposition: CIR Pending medical readiness, acceptance and insurance approval  Cerebral Edema- in the setting of  large R MCA stroke, with subtle worsening on repeat  imaging 3/8 with increased midline shift. - Repeat CT head 3/8 shows increased size of large right MCA territory infarct with unchanged subarachnoid hemorrhage. There is increased mass effect on the right lateral ventricle and 3 mm of left midline shift. A small left cerebellar infarct is also noted.  - Weaned off HTS On 3/11  Seizure-like activity noted clinically and confirmed on EEG. - Left upper extremity shaking - EEG concerning for multiple seizures.   - Loaded with Keppra 3 g followed by Keppra 500 twice daily on 3/9 - Good control of seizures as noted on  EEG with no sz. LTM d/ced 3/10 - Continue Keppra   Hypertension, Hypertensive urgency - Amlodipine 10 mg daily.   -  Lisinopril 40 mg daily.   - Hydralazine titrated to 100mg  TID 3/11 - Goal SBP <160  - Long-term BP goal normotensive - Monitor kidney function with daily BMP  Bradycardia - Appears asymptomatic - d/c labetalol prn - continue to monitor  Hyperlipidemia - Home meds:  Lipitor 40 - LDL 25, goal < 70 - Will reduce to Lipitor 20 - Continue statin at discharge  Leg swelling -h/o DVT bilat LE dopplers +right common femoral vein, and right femoral vein. left femoral vein, and left popliteal vein.  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  - On heparin drip d/t history of DVT, presence of the chronic DVTs as well as personal history of PE. This is with the understanding that he is at high risk for bleeding. - heparin level 0.76 - D/C Heparin infusion and start Pradaxa 150mg  bid (pt previously on Apixaban for CVA and Xarelto)  CKD 3 -Ctn baseline around 1.66 -Ctn 1.83->1.72->1.64->1.35->1.27 -Monitor with daily BMP as BP regimen is titrated  Hypokalemia - Monitor and replete  - 3.8->3.2 - send level today   Other Stroke Risk Factors  Cigarette smoker advised to stop smoking  Obesity, Body mass index is 33.94 kg/m., BMI >/= 30 associated with increased stroke risk, recommend weight loss, diet and exercise as appropriate   Hx stroke/TIA  OSA  Other risk factors  HgbA1c 5.9, goal < 7.0   Matthew Wise Stroke NP 02/09/2021 12:42 PM I have personally obtained history,examined this patient, reviewed notes, independently viewed imaging studies, participated in medical decision making and plan of care.ROS completed by me personally and pertinent positives fully documented  I have made any additions or clarifications directly to the above note. Agree with note above.. Continue ongoing medical treatment.  Repeat NOx 3 monitor for sleep smart study for sleep apnea tonight.  Await transfer to inpatient rehab  when bed available.  Greater than 50% time during this 25-minute visit was spent in counseling and coordination of care discussion with care team and answering questions.  Antony Contras, MD Medical Director [place] Pager: [phone] 02/09/2021 2:31 PM   Hospital day # 7

## 2021-02-09 NOTE — Progress Notes (Addendum)
ANTICOAGULATION CONSULT NOTE  Pharmacy Consult:  Heparin>>pradaxa Indication:  VTE treatment  Heparin dosing weight: 104.9 kg  Labs: Recent Labs    02/06/21 1345 02/07/21 0351 02/07/21 1359 02/07/21 1611 02/08/21 0347 02/08/21 0925 02/09/21 0339  HGB  --  13.4  --   --  12.8*  --   --   HCT  --  39.0  --   --  37.5*  --   --   PLT  --  186  --   --  201  --   --   APTT  --  59*  --  89* 80*  --  83*  HEPARINUNFRC  --  0.76*  --   --  0.74*  --  0.68  CREATININE 1.35*  --  1.27*  --   --  1.27*  --      Assessment: 36 YOM with history of CVA presented to [facility] with left arm/leg weakness. Patient transferred to [facility] for revascularization of acute CVA on 02/03/21. He was on Eliquis PTA for history of PE/DVT, last dose on 3/6 at 0830. 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 Pharmacy consulted to dose IV heparin per stroke protocol.  aPTT continues to be therapeutic. Heparin level is close to correlating. Plan to go to [facility] today.   Addendum  Apixaban was ordered to be restarted. D/w NP Prince Rome and we will change to Pradaxa instead due to failure with apixaban and Xarelto in the past.   Scr 1.27>>CrCl 85 ml/Angeni Chaudhuri  Goal of Therapy:   Monitor platelets by anticoagulation protocol: Yes   Plan:  Continue heparin gtt at 1800 units/hr Monitor daily heparin level/aPTT until correlating, CBC, s/sx bleeding  Addendum  Dc heparin Pradaxa 150mg  PO BID  Onnie Boer, PharmD, [place], AAHIVP, CPP Infectious Disease Pharmacist 02/09/2021 8:05 AM

## 2021-02-09 NOTE — Care Management (Signed)
Benefit check for Pradaxa submitted. Will result Monday

## 2021-02-10 DIAGNOSIS — I6601 Occlusion and stenosis of right middle cerebral artery: Secondary | ICD-10-CM | POA: Diagnosis not present

## 2021-02-10 LAB — CBC
HCT: 36.7 % — ABNORMAL LOW (ref 39.0–52.0)
Hemoglobin: 12.5 g/dL — ABNORMAL LOW (ref 13.0–17.0)
MCH: 28.4 pg (ref 26.0–34.0)
MCHC: 34.1 g/dL (ref 30.0–36.0)
MCV: 83.4 fL (ref 80.0–100.0)
Platelets: 212 10*3/uL (ref 150–400)
RBC: 4.4 MIL/uL (ref 4.22–5.81)
RDW: 13.2 % (ref 11.5–15.5)
WBC: 5 10*3/uL (ref 4.0–10.5)
nRBC: 0 % (ref 0.0–0.2)

## 2021-02-10 LAB — BASIC METABOLIC PANEL
Anion gap: 8 (ref 5–15)
BUN: 13 mg/dL (ref 6–20)
CO2: 25 mmol/L (ref 22–32)
Calcium: 9.2 mg/dL (ref 8.9–10.3)
Chloride: 105 mmol/L (ref 98–111)
Creatinine, Ser: 1.5 mg/dL — ABNORMAL HIGH (ref 0.61–1.24)
GFR, Estimated: 54 mL/min — ABNORMAL LOW (ref >60)
Glucose, Bld: 94 mg/dL (ref 70–99)
Potassium: 3.3 mmol/L — ABNORMAL LOW (ref 3.5–5.1)
Sodium: 138 mmol/L (ref 135–145)

## 2021-02-10 MED ORDER — POTASSIUM CHLORIDE CRYS ER 20 MEQ PO TBCR
60.0000 meq | EXTENDED_RELEASE_TABLET | Freq: Once | ORAL | Status: AC
  Administered 2021-02-10: 60 meq via ORAL
  Filled 2021-02-10: qty 3

## 2021-02-10 NOTE — Progress Notes (Signed)
Occupational Therapy Treatment Patient Details Name: Matthew Wise MRN: [id number] DOB: 05-04-1961 Today's Date: 02/10/2021    History of present illness The pt is a 60 yo male presenting from home on 3/6 with L-sided weakness. Imaging revealed occlusion of R M1 segment and infarct in posterior R frontoparietal region. Pt is now s/p arteriogram with revascularization of R MCA M1 segment. 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 The pt has been reccently admitted (2/22-2/28) with R MCA stroke and was d/c home. PMH includes: CVA, CKD III, DVT, HTN, PE, HLD.   OT comments  Pt progressing towards established OT goals. Pt currently requiring Mod +2 for lateral scoot to recliner and for sit<>stand at sink. Once in standing, pt requiring Max A +2 for blocking of L knee and correcting left lateral lean. Pt performing hand hygiene at sink with Max A +2 for standing balance and Max hand over hand for incorporating LUE. Pt continues to be motivated to participate in therapy and return to PLOF. Continue to recommend dc to CIR and will continue to follow acutely as admitted.    Follow Up Recommendations  CIR    Equipment Recommendations  Other (comment) (Defer to next venue)    Recommendations for Other Services Rehab consult;PT consult;Speech consult    Precautions / Restrictions Precautions Precautions: Fall Precaution Comments: L hemiplegia Restrictions Weight Bearing Restrictions: No       Mobility Bed Mobility Overal bed mobility: Needs Assistance Bed Mobility: Supine to Sit     Supine to sit: Min assist;HOB elevated;+2 for physical assistance     General bed mobility comments: Pt rolls to L with R hand to L rail with supervision. Pt needs cues to attend to LLE but is able to verbalize that he can assist it with RLE. Min A needed for LLE off bed with min A at trunk as well and +2 to work on [employer] through [place] as he rose    Transfers Overall transfer level: Needs  assistance Equipment used: 2 person hand held assist Transfers: Sit to/from Stand;Lateral/Scoot Transfers Sit to Stand: Mod assist;+2 physical assistance Stand pivot transfers:  (with stedy)      Lateral/Scoot Transfers: Mod assist;+2 physical assistance General transfer comment: pt performed R lateral scoot transfer bed>recliner with mod A from at hips and min A from behind for safety. Pt needs cues to use RLE and had difficulty moving hips to R, tends to lift fwd instead. From recliner pt performed sit>stand at sink with mod on R side and max A L knee to prevent buckling or hyperextension. No quad activation noted.    Balance Overall balance assessment: Needs assistance Sitting-balance support: Single extremity supported;Feet supported Sitting balance-Leahy Scale: Good Sitting balance - Comments: pt able to correct L lean when cued, unaware on his own. Worked on L elbow lean and returning to upright sitting 5x. Pt beginning to have flexor/ extensor tone L elbow. Postural control: Left lateral lean Standing balance support: Bilateral upper extremity supported;During functional activity Standing balance-Leahy Scale: Poor Standing balance comment: UE support and mod/ max A +2 needed to maintain midline standing and allow for functional use of RUE                           ADL either performed or assessed with clinical judgement   ADL Overall ADL's : Needs assistance/impaired     Grooming: Wash/dry hands;Standing;Maximal assistance Grooming Details (indicate cue type and reason): Pt standing  with Mod A +2. And then requiring Max A for standing balance with L knee blocked and support for L lateral lean. Max hand over hand for enagaging LUE to reach forward and turn on faucet and get soap. Pt performing bilateral coorindation to rub soap on L hand with RUE. Fatiguing and requiring to set for drying hands.                 Toilet Transfer: Moderate assistance;+2 for physical  assistance;+2 for safety/equipment;Transfer board (lateral scoot to drop arm recliner) Toilet Transfer Details (indicate cue type and reason): Mod A +2 to sequencing and faciltiating hips over to recliner         Functional mobility during ADLs: Moderate assistance;Maximal assistance;+2 for physical assistance;+2 for safety/equipment General ADL Comments: Continues to present with decreased coordination, balance, and functional use of LUE.     Vision       Perception     Praxis      Cognition Arousal/Alertness: Awake/alert Behavior During Therapy: Flat affect Overall Cognitive Status: Impaired/Different from baseline Area of Impairment: Problem solving;Safety/judgement;Following commands                       Following Commands: Follows one step commands consistently;Follows one step commands with increased time;Follows multi-step commands with increased time Safety/Judgement: Decreased awareness of safety;Decreased awareness of deficits   Problem Solving: Slow processing;Decreased initiation;Requires tactile cues General Comments: pt presenting with flat affect. pt following simple commands consistently, benefits from multimodal cues at times in relation to posture and maintaining midline. Favors R gaze but smoothly crosses midline to L when cued        Exercises Exercises: Other exercises Other Exercises Other Exercises: Left lateral lean for weight bearing rhgouh L elbow. Pt requiring Mod A for facilitating weight bearing through LUE. Able to correct sitting posterior with Min Guard A.   Shoulder Instructions       General Comments     Pertinent Vitals/ Pain       Pain Assessment: No/denies pain  Home Living                                          Prior Functioning/Environment              Frequency  Min 2X/week        Progress Toward Goals  OT Goals(current goals can now be found in the care plan section)  Progress  towards OT goals: Progressing toward goals  Acute Rehab OT Goals Patient Stated Goal: improve mobility OT Goal Formulation: With patient Time For Goal Achievement: 02/09/21 Potential to Achieve Goals: Good ADL Goals Pt Will Perform Eating: with modified independence;sitting Pt Will Perform Grooming: with min guard assist;sitting Pt Will Perform Upper Body Dressing: with min assist;sitting Pt Will Perform Lower Body Dressing: sit to/from stand;with modified independence Pt Will Transfer to Toilet: with min assist;with +2 assist;stand pivot transfer;bedside commode Pt Will Perform Toileting - Clothing Manipulation and hygiene: with modified independence;sit to/from stand Pt/caregiver will Perform Home Exercise Program: Increased strength;Left upper extremity;Independently;With written HEP provided Additional ADL Goal #1: Pt will use LUE as a dependent stabilizer during ADLs with Min A Additional ADL Goal #2: Pt will attend to 4/5 grooming items in L visual field with Min cues  Plan Discharge plan remains appropriate    Co-evaluation    PT/OT/SLP Co-Evaluation/Treatment:  Yes Reason for Co-Treatment: Complexity of the patient's impairments (multi-system involvement);For patient/therapist safety;To address functional/ADL transfers PT goals addressed during session: Mobility/safety with mobility;Balance;Strengthening/ROM OT goals addressed during session: ADL's and self-care      AM-PAC OT "6 Clicks" Daily Activity     Outcome Measure   Help from another person eating meals?: A Little Help from another person taking care of personal grooming?: A Lot Help from another person toileting, which includes using toliet, bedpan, or urinal?: A Lot Help from another person bathing (including washing, rinsing, drying)?: A Lot Help from another person to put on and taking off regular upper body clothing?: A Lot Help from another person to put on and taking off regular lower body clothing?: A  Lot 6 Click Score: 13    End of Session Equipment Utilized During Treatment: Gait belt  OT Visit Diagnosis: Unsteadiness on feet (R26.81);Other abnormalities of gait and mobility (R26.89);Hemiplegia and hemiparesis;Other symptoms and signs involving cognitive function;Other symptoms and signs involving the nervous system (R29.898) Hemiplegia - Right/Left: Left Hemiplegia - dominant/non-dominant: Non-Dominant Hemiplegia - caused by: Cerebral infarction   Activity Tolerance Patient tolerated treatment well   Patient Left in chair;with call bell/phone within reach;with chair alarm set   Nurse Communication Mobility status        Time: 7048-8891 OT Time Calculation (min): 31 min  Charges: OT General Charges $OT Visit: 1 Visit OT Treatments $Self Care/Home Management : 8-22 mins  [place], OTR/L Acute Rehab Pager: [phone] Office: [place] 02/10/2021, 10:31 AM

## 2021-02-10 NOTE — H&P (Signed)
Physical Medicine and Rehabilitation Admission H&P    Chief Complaint  Patient presents with  . Weakness  . Code Stroke  : HPI: Matthew Wise is a 60 year old right-handed male with history of pulmonary emboli maintained on Eliquis, hypertension, CKD stage III, hyperlipidemia, prediabetes, sickle cell disease, tobacco abuse as well as right MCA infarction recently admitted 01/21/2021 to 01/27/2021 and discharged home ambulating 150 feet rolling walker.  History taken from chart review and patient.  Patient lives alone.  1 level home 3 steps to entry.  Independent with assistive device.  He has a sister who checks on him regularly.  He presented on 02/03/2021 with left hemiparesis, subacute onset.  Crania CT scan unremarkable for acute intracranial process.  Suspect acute infarct of the right frontal parietal lobe posterior superior to chronic infarct with probable involvement of lateral precentral and postcentral gyri.  Patient did not receive TPA.  CT angiogram of head and neck showed abrupt occlusion of the right M1 segment new as compared to prior CTA of 01/25/2021.  Acute core infarct of the posterior right frontal parietal region largely corresponding with a completed infarct as seen on prior MRI.  EEG evidence of focal seizure arising from the right posterior quadrant during which patient was noted to have left upper extremity twitching.  Additionally there was cortical dysfunction in the right frontal temporal region likely due to underlying stroke.  Most recent echocardiogram with ejection fraction 55 to 60%.  Admission chemistries unremarkable except creatinine 1.83, urine drug screen negative, hemoglobin A1c 5.9.  Alcohol negative.  Patient underwent arteriogram with revascularization of right MCA M1 segment per interventional radiology.  Latest follow-up MRI showed large acute right MCA territory infarct with petechial hemorrhage and minimal leftward midline shift as well as small acute left  cerebellar infarct new small acute early subacute infarct in left occipital left parietal left frontal lobes.  Bilateral extremity Dopplers findings consistent with age-indeterminate DVT involving the right common femoral vein and right femoral vein as well as left femoral vein and left popliteal vein, left common femoral and SFA junction.  Patient was placed on intravenous heparin remained on low-dose aspirin and Pradaxa was added.  He had been loaded for Keppra for his suspected seizure.  Hospital course further complicated by dysphagia, started on Dysphagia #3 thin liquid diet.  Therapy evaluations completed patient was admitted due to left-sided hemiparesis for a comprehensive rehab program. Please see preadmission assessment from earlier today as well.   Review of Systems  Constitutional: Negative for chills and fever.  HENT: Negative for hearing loss.   Eyes: Negative for blurred vision and double vision.  Respiratory: Negative for cough and shortness of breath.   Cardiovascular: Negative for chest pain and palpitations.  Gastrointestinal: Negative for heartburn, nausea and vomiting.  Genitourinary: Negative for dysuria, flank pain and hematuria.  Musculoskeletal: Positive for joint pain and myalgias.  Skin: Negative for rash.  Neurological: Positive for focal weakness and weakness. Negative for sensory change.  All other systems reviewed and are negative.  Past Medical History:  Diagnosis Date  . Acute CVA (cerebrovascular accident) ([place]) 09/09/2020  . Chronic anticoagulation 11/14/2014  . CKD (chronic kidney disease) stage 3, GFR 30-59 ml/min ([facility]) 10/24/2019  . CKD (chronic kidney disease) stage 3, GFR 30-59 ml/min ([facility]) 10/24/2019  . Clotting disorder ([place])   . 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 DVT (deep venous thrombosis) ([place])  06/15/2011  . DVT of leg (deep venous thrombosis) ([facility])    rt.  leg  . Essential hypertension 11/28/2020  . H/O PE (pulmonary embolism) 06/15/2011  . Hyperlipidemia 11/28/2020  . 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 Prediabetes 10/24/2019  . Thyroid nodule 01/25/2021   Past Surgical History:  Procedure Laterality Date  . COLONOSCOPY WITH PROPOFOL N/A 10/29/2015   incomplete due to redundant colon. two simple tubular adenomas removed. patient was advised to go to [facility] for colonoscopy but he did not keep appt.  . IR CT HEAD LTD  02/02/2021  . IR PERCUTANEOUS ART THROMBECTOMY/INFUSION INTRACRANIAL INC DIAG ANGIO  02/02/2021  . RADIOLOGY WITH ANESTHESIA N/A 02/02/2021   Procedure: IR WITH ANESTHESIA;  Surgeon: Luanne Bras, MD;  Location: [facility];  Service: Radiology;  Laterality: N/A;  . TEE WITHOUT CARDIOVERSION N/A 09/10/2020   Procedure: TRANSESOPHAGEAL ECHOCARDIOGRAM (TEE) WITH PROPOFOL;  Surgeon: Satira Sark, MD;  Location: [facility];  Service: Cardiovascular;  Laterality: N/A;   Family History  Problem Relation Age of Onset  . COPD Father   . Stroke Brother   . Pulmonary embolism Sister   . Throat cancer Brother   . Arthritis Mother   . Diabetes Mother   . Stroke Mother   . Hypertension Brother   . Diabetes Brother   . Arthritis Brother   . Diabetes Sister   . Colon cancer Neg Hx    Social History:  reports that he has been smoking cigarettes. He started smoking about 37 years ago. He has a 30.00 pack-year smoking history. He has never used smokeless tobacco. He reports previous alcohol use of about 3.0 standard drinks of alcohol per week. He reports previous drug use. Allergies: No Known Allergies Medications Prior to Admission  Medication Sig Dispense Refill  . amLODipine (NORVASC) 5 MG tablet TAKE 1 TABLET (5 MG TOTAL) BY MOUTH DAILY. 90 tablet 0  . apixaban (ELIQUIS) 5 MG TABS tablet Take 1 tablet (5 mg total) by mouth 2 (two) times daily. 60 tablet 3  . atorvastatin (LIPITOR) 40 MG tablet TAKE 1 TABLET BY MOUTH EVERY  DAY (Patient taking differently: Take 40 mg by mouth daily.) 90 tablet 0  . fluticasone (FLONASE) 50 MCG/ACT nasal spray Place 1 spray into both nostrils daily as needed for allergies or rhinitis.    Marland Kitchen levothyroxine (SYNTHROID) 25 MCG tablet TAKE 1 TABLET BY MOUTH EVERY DAY (Patient taking differently: Take 25 mcg by mouth daily before breakfast.) 90 tablet 0  . Multiple Vitamins-Minerals (CENTRUM ADULTS PO) Take 1 tablet by mouth daily.      Drug Regimen Review Drug regimen was reviewed and remains appropriate with no significant issues identified  Home: Home Living Family/patient expects to be discharged to:: Private residence Living Arrangements: Alone Available Help at Discharge: Family,Available 24 hours/day Type of Home: House Home Access: Stairs to enter [employer] of Steps: 2-3 Entrance Stairs-Rails: Right Home Layout: One level Bathroom Shower/Tub: Chiropodist: Standard Bathroom Accessibility: Yes Home Equipment: Cane - quad,Shower seat  Lives With: Alone   Functional History: Prior Function Level of Independence: Independent with assistive device(s) Comments: Uses four prong cane, cooks for himself, uses scooter at grocery  Functional Status:  Mobility: Bed Mobility Overal bed mobility: Needs Assistance Bed Mobility: Rolling,Sidelying to Sit Rolling: Supervision Sidelying to sit: Mod assist Supine to sit: Min assist,HOB elevated,+2 for physical assistance General bed mobility comments: Pt rolls to L with R hand to L rail  with supervision. Pt attempted to move RLE with L but needed mod A to complete B LE's off bed and mod A at trunk to rise with use of rail Transfers Overall transfer level: Needs assistance Equipment used: Ambulation equipment used Transfer via Lift Equipment: Stedy Transfers: Sit to/from Stand Sit to Stand: Mod assist,+2 physical assistance Stand pivot transfers: Total assist,From elevated surface (with stedy)   Lateral/Scoot Transfers: Mod assist,+2 physical assistance General transfer comment: worked on sit<>stand multiple times within stedy, pt needing more assist after 3 reps due to fatigue. Performed in front of mirror working on maintaining midline during rising. Ambulation/Gait General Gait Details: pregait activity in standing including standing wt shifting    ADL: ADL Overall ADL's : Needs assistance/impaired Eating/Feeding: Minimal assistance,Sitting Eating/Feeding Details (indicate cue type and reason): Assistance for bilateral tasks Grooming: Wash/dry hands,Standing,Maximal assistance Grooming Details (indicate cue type and reason): Pt standing with Mod A +2. And then requiring Max A for standing balance with L knee blocked and support for L lateral lean. Max hand over hand for enagaging LUE to reach forward and turn on faucet and get soap. Pt performing bilateral coorindation to rub soap on L hand with RUE. Fatiguing and requiring to set for drying hands. Upper Body Bathing: Moderate assistance,Sitting Lower Body Bathing: Maximal assistance,Sit to/from stand Upper Body Dressing : Moderate assistance,Sitting Lower Body Dressing: Maximal assistance,Sit to/from stand Lower Body Dressing Details (indicate cue type and reason): Pt attempting to bend forward and pull up sock over heal but unable. Requiring Max A to don sock Toilet Transfer: Moderate assistance,+2 for physical assistance,+2 for safety/equipment,Transfer board (lateral scoot to drop arm recliner) Toilet Transfer Details (indicate cue type and reason): Mod A +2 to sequencing and faciltiating hips over to recliner Functional mobility during ADLs: Moderate assistance,Maximal assistance,+2 for physical assistance,+2 for safety/equipment General ADL Comments: Continues to present with decreased coordination, balance, and functional use of LUE.  Cognition: Cognition Overall Cognitive Status: Impaired/Different from  baseline Arousal/Alertness: Awake/alert Orientation Level: Oriented to person,Oriented to place,Oriented to time,Oriented to situation Attention: Focused,Sustained,Selective,Alternating Focused Attention: Appears intact Sustained Attention: Appears intact Selective Attention: Appears intact Selective Attention Impairment: Verbal basic Alternating Attention: Appears intact Memory: Impaired Memory Impairment: Decreased short term memory,Decreased recall of new information Decreased Short Term Memory: Verbal basic Awareness: Impaired Awareness Impairment: Emergent impairment Problem Solving: Impaired Problem Solving Impairment: Functional basic Executive Function: Self Monitoring,Self Correcting Self Monitoring: Impaired Self Monitoring Impairment: Verbal basic,Functional basic Safety/Judgment: Impaired Cognition Arousal/Alertness: Awake/alert Behavior During Therapy: Flat affect Overall Cognitive Status: Impaired/Different from baseline Area of Impairment: Problem solving,Safety/judgement,Following commands Following Commands: Follows one step commands consistently,Follows one step commands with increased time,Follows multi-step commands with increased time Safety/Judgement: Decreased awareness of safety,Decreased awareness of deficits Problem Solving: Slow processing,Decreased initiation,Requires tactile cues General Comments: unable to discern if pt's decreased verbalization during session is due to fatigue from the effort or depressed mood from experiencing his deficits more notably during therapy sessions  Physical Exam: Blood pressure (!) 149/76, pulse 65, temperature 98.4 F (36.9 C), temperature source Oral, resp. rate 18, height 6\' 1"  (1.854 m), weight 116.7 kg, SpO2 96 %. Physical Exam Vitals reviewed.  Constitutional:      General: He is not in acute distress.    Appearance: He is not ill-appearing.  HENT:     Head: Normocephalic and atraumatic.     Right Ear: External  ear normal.     Left Ear: External ear normal.     Nose: Nose normal.  Eyes:  General:        Right eye: No discharge.        Left eye: No discharge.     Extraocular Movements: Extraocular movements intact.  Cardiovascular:     Rate and Rhythm: Normal rate and regular rhythm.  Pulmonary:     Effort: Pulmonary effort is normal. No respiratory distress.     Breath sounds: No stridor.  Abdominal:     General: Abdomen is flat. Bowel sounds are normal. There is no distension.  Musculoskeletal:     Cervical back: Normal range of motion and neck supple.     Comments: LLE edema  Skin:    Comments: LLE with ulcer Vascular changes  Neurological:     Mental Status: He is alert.     Comments: Alert and oriented Makes eye contact with examiner.   Follows simple commands.   Fair awareness of deficits. Motor: RUE/RLE: 5/5 proximal to distal LUE: 0/5 LLE: HF, KE 3-/5, ADF 2/5 Sensation intact to light touch  Psychiatric:        Mood and Affect: Affect is blunt and flat.        Behavior: Behavior is slowed.     Results for orders placed or performed during the hospital encounter of 02/02/21 (from the past 48 hour(s))  CBC     Status: Abnormal   Collection Time: 02/11/21  2:40 AM  Result Value Ref Range   WBC 5.5 4.0 - 10.5 K/uL   RBC 4.51 4.22 - 5.81 MIL/uL   Hemoglobin 13.0 13.0 - 17.0 g/dL   HCT 36.7 (L) 39.0 - 52.0 %   MCV 81.4 80.0 - 100.0 fL   MCH 28.8 26.0 - 34.0 pg   MCHC 35.4 30.0 - 36.0 g/dL   RDW 13.2 11.5 - 15.5 %   Platelets 237 150 - 400 K/uL   nRBC 0.0 0.0 - 0.2 %    Comment: Performed at [facility], [place] [address] [place], [place] [address]  Basic metabolic panel     Status: Abnormal   Collection Time: 02/11/21  2:40 AM  Result Value Ref Range   Sodium 135 135 - 145 mmol/L   Potassium 3.1 (L) 3.5 - 5.1 mmol/L   Chloride 101 98 - 111 mmol/L   CO2 25 22 - 32 mmol/L   Glucose, Bld 94 70 - 99 mg/dL    Comment: Glucose reference range applies only to  samples taken after fasting for at least 8 hours.   BUN 9 6 - 20 mg/dL   Creatinine, Ser 1.31 (H) 0.61 - 1.24 mg/dL   Calcium 9.5 8.9 - 10.3 mg/dL   GFR, Estimated >60 >60 mL/min    Comment: (NOTE) Calculated using the CKD-EPI Creatinine Equation (2021)    Anion gap 9 5 - 15    Comment: Performed at [facility] [address] [place], [place] [address]  CBC     Status: Abnormal   Collection Time: 02/12/21  3:56 AM  Result Value Ref Range   WBC 5.6 4.0 - 10.5 K/uL   RBC 4.53 4.22 - 5.81 MIL/uL   Hemoglobin 13.1 13.0 - 17.0 g/dL   HCT 37.1 (L) 39.0 - 52.0 %   MCV 81.9 80.0 - 100.0 fL   MCH 28.9 26.0 - 34.0 pg   MCHC 35.3 30.0 - 36.0 g/dL   RDW 13.1 11.5 - 15.5 %   Platelets 240 150 - 400 K/uL   nRBC 0.0 0.0 - 0.2 %  Comment: Performed at [facility], [place] [address] [place], [place] [address]   No results found.     Medical Problem List and Plan: 1.  Left-sided hemiparesis secondary to right MCA infarct likely atheroembolic status post thrombectomy as well as posterior frontoparietal region infarct   -patient may shower  -ELOS/Goals: 14-16 days/Supervision/Min A  Admit to CIR 2.  Antithrombotics: -DVT/anticoagulation: Bilateral lower extremity DVT/PE in the setting of hypercoagulopathy disorder.  Continue Pradaxa.  -antiplatelet therapy: Aspirin 81 mg daily 3. Pain Management: Tylenol as needed 4. Mood: Provide emotional support  -antipsychotic agents: N/A 5. Neuropsych: This patient is capable of making decisions on his own behalf. 6. Skin/Wound Care: Routine skin checks 7. Fluids/Electrolytes/Nutrition: Routine in and outs  CMP ordered 8.  Seizure disorder.  Keppra 500 mg twice daily 9.  Hypertension.  Lisinopril 40 mg daily, hydralazine 75 mg every 8 hours, Norvasc 10 mg daily.    Monitor with increased mobility 10.  Hypothyroidism.  Synthroid 11.  CKD stage III.  Baseline creatinine 1.83.    CMP ordered for tomorrow 12.  History of tobacco abuse.   Provide counseling 13.  Hyperlipidemia: Lipitor  Cathlyn Parsons, PA-C 02/12/2021  I have personally performed a face to face diagnostic evaluation, including, but not limited to relevant history and physical exam findings, of this patient and developed relevant assessment and plan.  Additionally, I have reviewed and concur with the physician assistant's documentation above.  Delice Lesch, MD, ABPMR

## 2021-02-10 NOTE — PMR Pre-admission (Signed)
PMR Admission Coordinator Pre-Admission Assessment  Patient: Matthew Wise is an 60 y.o., male MRN: [id number] DOB: [birth year] Height: 6\' 1"  (185.4 cm) Weight: 116.7 kg              Insurance Information HMO: yes    PPO:      PCP:      IPA:      80/20:      OTHER:  PRIMARY: [place] [employer]      Policy#: [id number]      Subscriber: pt CM Name: Matthew Wise     Phone#: [phone] option 3     Fax#: [phone] Pre-Cert#: [id number]  approved for 7 days    Employer: n/a Benefits:  Phone #: [phone]     Name: 3/14 Eff. Date: 11/30/2020     Deduct: none      Out of Pocket Max: $4500      Life Max: none  CIR: $325 co pay per day days 1 until 5      SNF: no copay days 1 until 20; $188 co pay per day days 45 until 100 Outpatient: $30 per visit     Co-Pay: visits per medical neccesity Home Health: 100%      Co-Pay: visits per medical neccesity DME: 80%     Co-Pay: 20% Providers: in network  SECONDARY: none      Policy#:       Phone#:   Development worker, community:       Phone#:   The Engineer, [employer]" for patients in Inpatient Rehabilitation Facilities with attached "Privacy Act [place] Records" was provided and verbally reviewed with: Patient  Emergency Contact Information Contact Information    Name Relation Home Work Mobile   [place] Sister [phone]  [phone]   [place] Niece   [phone]     Current Medical History  Patient Admitting Diagnosis: CVA  History of Present Illness:  60 y.o. right-handed male with history of  pulmonary emboli maintained on Eliquis, hypothyroidism, hypertension, CKD stage III, hyperlipidemia, prediabetes, sickle cell disease, tobacco abuse as well as right MCA infarction recently admitted 01/21/21-01/27/21 and discharged home ambulating 150 feet with rolling walker.   Presented 02/03/2021 with left-sided weakness of acute onset.  Cranial CT scan showed no acute intracranial hemorrhage.  Suspect acute  infarct of the right frontal parietal lobe posterior superior to chronic infarct with probable involvement of lateral precentral and postcentral gyri.  Patient did not receive TPA.  CT angiogram of head and neck showed abrupt occlusion of the right M1 segment new as compared to prior CTA of 01/25/2021.  Acute core infarct of the posterior right frontal parietal region largely corresponding with a completed infarct as seen on prior MRI.  EEG showed evidence of focal seizure arising from right posterior quadrant during which patient was noted to have left upper extremity twitching.  Additionally there was cortical dysfunction in the right frontal temporal region likely due to underlying stroke.  Most recent echocardiogram with ejection fraction of 55 to 60%.  Admission chemistries unremarkable except creatinine 1.83, urine drug screen negative, hemoglobin A1c 5.9, alcohol negative.  Patient underwent arteriogram with revascularization of right MCA M1 segment per interventional radiology.  Latest follow-up MRI showed large acute right MCA territory infarct with petechial hemorrhage and minimal leftward midline shift as well as small acute left cerebellar infarct new small acute early subacute infarct in left occipital left parietal left frontal lobes.  Bilateral lower extremity Dopplers showed findings consistent with age-indeterminate DVT involving  the right common femoral vein and right femoral vein as well as left femoral vein and left popliteal vein, left common femoral and SF junction.  Patient was placed on intravenous heparin and remains on low-dose aspirin.  He has been loaded for Keppra for suspected seizure.  Maintained on a dysphagia #3 thin liquid diet.    Complete NIHSS TOTAL: 8 Glasgow Coma Scale Score: 15  Past Medical History  Past Medical History:  Diagnosis Date  . Acute CVA (cerebrovascular accident) ([place]) 09/09/2020  . Chronic anticoagulation 11/14/2014  . CKD (chronic kidney disease) stage  3, GFR 30-59 ml/min (HCC) 10/24/2019  . CKD (chronic kidney disease) stage 3, GFR 30-59 ml/min (HCC) 10/24/2019  . Clotting disorder ([place])   . 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 DVT (deep venous thrombosis) ([place]) 06/15/2011  . DVT of leg (deep venous thrombosis) (HCC)    rt. leg  . Essential hypertension 11/28/2020  . H/O PE (pulmonary embolism) 06/15/2011  . Hyperlipidemia 11/28/2020  . 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 Prediabetes 10/24/2019  . Thyroid nodule 01/25/2021    Family History  family history includes Arthritis in his brother and mother; COPD in his father; Diabetes in his brother, mother, and sister; Hypertension in his brother; Pulmonary embolism in his sister; Stroke in his brother and mother; Throat cancer in his brother.  Prior Rehab/Hospitalizations:  Has the patient had prior rehab or hospitalizations prior to admission? Yes  Has the patient had major surgery during 100 days prior to admission? No  Current Medications   Current Facility-Administered Medications:  .  acetaminophen (TYLENOL) tablet 650 mg, 650 mg, Oral, Q4H PRN, 650 mg at 02/05/21 0402 **OR** acetaminophen (TYLENOL) 160 MG/5ML solution 650 mg, 650 mg, Per Tube, Q4H PRN **OR** acetaminophen (TYLENOL) suppository 650 mg, 650 mg, Rectal, Q4H PRN, Bailey-Modzik, Delila A, NP .  amLODipine (NORVASC) tablet 10 mg, 10 mg, Oral, Daily, Bailey-Modzik, Delila A, NP, 10 mg at 02/12/21 1057 .  aspirin EC tablet 81 mg, 81 mg, Oral, Daily, Bailey-Modzik, Delila A, NP, 81 mg at 02/12/21 1058 .  chlorhexidine (PERIDEX) 0.12 % solution 15 mL, 15 mL, Mouth Rinse, BID, Bailey-Modzik, Delila A, NP, 15 mL at 02/12/21 1058 .  dabigatran (PRADAXA) capsule 150 mg, 150 mg, Oral, Q12H, Pham, Minh Q, RPH-CPP, 150 mg at 02/12/21 1058 .  docusate sodium (COLACE) capsule 100 mg, 100 mg, Oral, Daily, Bailey-Modzik, Delila A,  NP, 100 mg at 02/12/21 1058 .  hydrALAZINE (APRESOLINE) injection 10 mg, 10 mg, Intravenous, Q4H PRN, Bailey-Modzik, Delila A, NP, 10 mg at 02/08/21 1103 .  hydrALAZINE (APRESOLINE) tablet 75 mg, 75 mg, Oral, Q8H, Olivencia-Simmons, Ivelisse, NP, 75 mg at 02/12/21 6045 .  levETIRAcetam (KEPPRA) tablet 500 mg, 500 mg, Oral, BID, Bailey-Modzik, Delila A, NP, 500 mg at 02/12/21 1058 .  levothyroxine (SYNTHROID) tablet 25 mcg, 25 mcg, Oral, Q0600, Bailey-Modzik, Delila A, NP, 25 mcg at 02/12/21 0628 .  lisinopril (ZESTRIL) tablet 40 mg, 40 mg, Oral, Daily, Bailey-Modzik, Delila A, NP, 40 mg at 02/12/21 1057 .  MEDLINE mouth rinse, 15 mL, Mouth Rinse, BID, Bailey-Modzik, Delila A, NP, 15 mL at 02/12/21 1058 .  pantoprazole (PROTONIX) EC tablet 40 mg, 40 mg, Oral, QHS, Bailey-Modzik, Delila A, NP, 40 mg at 02/11/21 2217 .  potassium chloride SA (KLOR-CON) CR tablet 40 mEq, 40 mEq, Oral, BID, Olivencia-Simmons, Ivelisse, NP, 40 mEq at 02/12/21  1058 .  Resource ThickenUp Clear, , Oral, PRN, Bailey-Modzik, Delila A, NP, Given at 02/03/21 2109 .  senna-docusate (Senokot-S) tablet 1 tablet, 1 tablet, Oral, QHS, Bailey-Modzik, Delila A, NP, 1 tablet at 02/11/21 2218  Patients Current Diet:  Diet Order            DIET DYS 3 Room service appropriate? Yes; Fluid consistency: Thin  Diet effective now                 Precautions / Restrictions Precautions Precautions: Fall Precaution Comments: L hemiplegia Restrictions Weight Bearing Restrictions: No   Has the patient had 2 or more falls or a fall with injury in the past year?No  Slid from chair 3/15 in hospital  Prior Activity Level Limited Community (1-2x/wk): disabled  Prior Functional Level Prior Function Level of Independence: Independent with assistive device(s) Comments: Uses four prong cane, cooks for himself, uses scooter at [place]: Did the patient need help bathing, dressing, using the toilet or eating?   Independent  Indoor Mobility: Did the patient need assistance with walking from room to room (with or without device)? Independent  Stairs: Did the patient need assistance with internal or external stairs (with or without device)? Independent  Functional Cognition: Did the patient need help planning regular tasks such as shopping or remembering to take medications? Independent  Home Assistive Devices / Equipment Home Assistive Devices/Equipment: None Home Equipment: Cane - quad,Shower seat  Prior Device Use: Indicate devices/aids used by the patient prior to current illness, exacerbation or injury? 4 prong cane  Current Functional Level Cognition  Arousal/Alertness: Awake/alert Overall Cognitive Status: Impaired/Different from baseline Orientation Level: Oriented to person Following Commands: Follows one step commands consistently,Follows one step commands with increased time,Follows multi-step commands with increased time Safety/Judgement: Decreased awareness of safety,Decreased awareness of deficits General Comments: unable to discern if pt's decreased verbalization during session is due to fatigue from the effort or depressed mood from experiencing his deficits more notably during therapy sessions Attention: Focused,Sustained,Selective,Alternating Focused Attention: Appears intact Sustained Attention: Appears intact Selective Attention: Appears intact Selective Attention Impairment: Verbal basic Alternating Attention: Appears intact Memory: Impaired Memory Impairment: Decreased short term memory,Decreased recall of new information Decreased Short Term Memory: Verbal basic Awareness: Impaired Awareness Impairment: Emergent impairment Problem Solving: Impaired Problem Solving Impairment: Functional basic Executive Function: Self Monitoring,Self Correcting Self Monitoring: Impaired Self Monitoring Impairment: Verbal basic,Functional basic Safety/Judgment: Impaired    Extremity  Assessment (includes Sensation/Coordination)  Upper Extremity Assessment: LUE deficits/detail LUE Deficits / Details: No active movement. Noting tone with extension at shoulder, elbow, and hand. LUE Sensation: decreased light touch LUE Coordination: decreased fine motor,decreased gross motor  Lower Extremity Assessment: Defer to PT evaluation LLE Deficits / Details: grossly 2/5. pt able to activate through minimal AROM LLE Sensation: decreased light touch (pt originally denies difference in sensation, unable to detect any touch to LLE, absent two-point discrimination) LLE Coordination: decreased fine motor,decreased gross motor    ADLs  Overall ADL's : Needs assistance/impaired Eating/Feeding: Minimal assistance,Sitting Eating/Feeding Details (indicate cue type and reason): Assistance for bilateral tasks Grooming: Wash/dry hands,Standing,Maximal assistance Grooming Details (indicate cue type and reason): Pt standing with Mod A +2. And then requiring Max A for standing balance with L knee blocked and support for L lateral lean. Max hand over hand for enagaging LUE to reach forward and turn on faucet and get soap. Pt performing bilateral coorindation to rub soap on L hand with RUE. Fatiguing and requiring to set for  drying hands. Upper Body Bathing: Moderate assistance,Sitting Lower Body Bathing: Maximal assistance,Sit to/from stand Upper Body Dressing : Moderate assistance,Sitting Lower Body Dressing: Maximal assistance,Sit to/from stand Lower Body Dressing Details (indicate cue type and reason): Pt attempting to bend forward and pull up sock over heal but unable. Requiring Max A to don sock Toilet Transfer: Moderate assistance,+2 for physical assistance,+2 for safety/equipment,Transfer board (lateral scoot to drop arm recliner) Toilet Transfer Details (indicate cue type and reason): Mod A +2 to sequencing and faciltiating hips over to recliner Functional mobility during ADLs: Moderate  assistance,Maximal assistance,+2 for physical assistance,+2 for safety/equipment General ADL Comments: Continues to present with decreased coordination, balance, and functional use of LUE.    Mobility  Overal bed mobility: Needs Assistance Bed Mobility: Rolling,Sidelying to Sit Rolling: Supervision Sidelying to sit: Mod assist Supine to sit: Min assist,HOB elevated,+2 for physical assistance General bed mobility comments: Pt rolls to L with R hand to L rail with supervision. Pt attempted to move RLE with L but needed mod A to complete B LE's off bed and mod A at trunk to rise with use of rail    Transfers  Overall transfer level: Needs assistance Equipment used: Ambulation equipment used Transfer via Lift Equipment: Stedy Transfers: Sit to/from Stand Sit to Stand: Mod assist,+2 physical assistance Stand pivot transfers: Total assist,From elevated surface (with stedy)  Lateral/Scoot Transfers: Mod assist,+2 physical assistance General transfer comment: worked on sit<>stand multiple times within stedy, pt needing more assist after 3 reps due to fatigue. Performed in front of mirror working on maintaining midline during rising.    Ambulation / Gait / Stairs / Wheelchair Mobility  Ambulation/Gait General Gait Details: pregait activity in standing including standing wt shifting    Posture / Balance Dynamic Sitting Balance Sitting balance - Comments: vc's for midline sitting. Pt with more tone LUE today compared to yesterday Balance Overall balance assessment: Needs assistance Sitting-balance support: Single extremity supported,Feet supported Sitting balance-Leahy Scale: Good Sitting balance - Comments: vc's for midline sitting. Pt with more tone LUE today compared to yesterday Postural control: Left lateral lean Standing balance support: Bilateral upper extremity supported,During functional activity Standing balance-Leahy Scale: Poor Standing balance comment: UE support and mod/ max A  +2 needed to maintain midline standing and allow for functional use of RUE    Special needs/care consideration Seizure precautions Smoker Sleep smart study Slid from chair to floor on 3/15 on acute hospital   Previous Home Environment  Living Arrangements: Alone  Lives With: Alone Available Help at Discharge: Family,Available 24 hours/day Type of Home: House Home Layout: One level Home Access: Stairs to enter Entrance Stairs-Rails: Right Entrance Stairs-Number of Steps: 2-3 Bathroom Shower/Tub: Optometrist: Yes Home Care Services: No  Discharge Living Setting Plans for Discharge Living Setting: Patient's home,Alone,House Type of Home at Discharge: House Discharge Home Layout: One level Discharge Home Access: Stairs to enter Entrance Stairs-Rails: Right Entrance Stairs-Number of Steps: 2-3 Discharge Bathroom Shower/Tub: Tub/shower unit Discharge Bathroom Toilet: Standard Discharge Bathroom Accessibility: Yes How Accessible: Accessible via walker Does the patient have any problems obtaining your medications?: No  Social/Family/Support Systems Contact Information: sister, Deneise Lever, main contact Anticipated Caregiver: Deneise Lever, sister , Arby Barrette, girlfriend and other family members Anticipated Caregiver's Contact Information: see above Ability/Limitations of Caregiver: Arby Barrette works day shift Caregiver Availability: 24/7 Discharge Plan Discussed with Primary Caregiver: Yes Is Caregiver In Agreement with Plan?: Yes Does Caregiver/Family have Issues with Lodging/Transportation while Pt is in Rehab?: No  Goals  Patient/Family Goal for Rehab: min PT, OT and supervision SLP Expected length of stay: [facility]OS 14 to 18 days Pt/Family Agrees to Admission and willing to participate: Yes Program Orientation Provided & Reviewed with Pt/Caregiver Including Roles  & Responsibilities: Yes  Decrease burden of Care through IP rehab admission:  n/a  Possible need for SNF placement upon discharge:not anticipated  Patient Condition: This patient's medical and functional status has changed since the consult dated: 02/07/2021 in which the Rehabilitation Physician determined and documented that the patient's condition is appropriate for intensive rehabilitative care in an inpatient rehabilitation facility. See "History of Present Illness" (above) for medical update. Functional changes are: overall mod assist. Patient's medical and functional status update has been discussed with the Rehabilitation physician and patient remains appropriate for inpatient rehabilitation. Will admit to inpatient rehab today.  Preadmission Screen Completed By:  Cleatrice Burke, RN, 02/12/2021 11:04 AM ______________________________________________________________________   Discussed status with Dr. Posey Pronto on 3/16/2022at 1101 and received approval for admission today.  Admission Coordinator:  Cleatrice Burke, time 1610 Date 02/12/2021

## 2021-02-10 NOTE — TOC Benefit Eligibility Note (Signed)
Transition of Care [facility]) Benefit Eligibility Note    Patient Details  Name: Matthew Wise MRN: [id number] Date of Birth: 1961/07/15   Medication/Dose: Eliquis 36m. bid or  Xarelto 261mdaily  Covered?: Yes  Tier: 3 Drug  Prescription Coverage [facility]erred [facility]  Spoke with Person/Company/Phone Number:: Emma B. W/[employer] PH# [phone]-Pay: $47.00  Prior Approval: No  Deductible: Met  Additional Notes: Pradaxa [id number] on formulary  [id number] Number: 02/10/2021, 8:56 AM

## 2021-02-10 NOTE — Progress Notes (Signed)
Inpatient Rehabilitation Admissions Coordinator  I met with patient at bedside. I have received updated PT and OT notes and will begin insurance authorization for  a possible CIR admit pending insurance approval. I have left his sister, Deneise Lever, a voicemail to clarify caregiver supports.  Danne Baxter, RN, MSN Rehab Admissions Coordinator [phone] 02/10/2021 10:53 AM

## 2021-02-10 NOTE — Progress Notes (Signed)
STROKE TEAM PROGRESS NOTE   INTERVAL HISTORY  Transitioned to Pradaxa yesterday. Stable neurologically. Afebrile. Vital signs stable, bradycardia improving. On dysphagia diet Therapists recommending CIR awaiting insurance approval  Patient did participate in the sleep smart study and tested positive:on knox 3 monitor for sleep apnea.Marland Kitchen  He was instructed to wear his CPAP tonight.    Vitals:   02/10/21 0339 02/10/21 0756 02/10/21 1154 02/10/21 1519  BP: 126/68 128/76 125/71 (!) (P) 155/79  Pulse: (!) 58 (!) 53 71 (P) 60  Resp:  18 16 (P) 18  Temp: 98.1 F (36.7 C) 98 F (36.7 C) 98 F (36.7 C) (P) 97.8 F (36.6 C)  TempSrc: Oral Oral Oral (P) Oral  SpO2: 97% 100% 100%   Weight:      Height:       CBC    Component Value Date/Time   WBC 5.0 02/10/2021 0207   RBC 4.40 02/10/2021 0207   HGB 12.5 (L) 02/10/2021 0207   HCT 36.7 (L) 02/10/2021 0207   PLT 212 02/10/2021 0207   MCV 83.4 02/10/2021 0207   MCH 28.4 02/10/2021 0207   MCHC 34.1 02/10/2021 0207   RDW 13.2 02/10/2021 0207   LYMPHSABS 1.4 02/04/2021 0930   MONOABS 0.9 02/04/2021 0930   EOSABS 0.1 02/04/2021 0930   BASOSABS 0.0 02/04/2021 0930   CMP Latest Ref Rng & Units 02/10/2021 02/09/2021 02/08/2021  Glucose 70 - 99 mg/dL 94 87 96  BUN 6 - 20 mg/dL 13 14 9   Creatinine 0.61 - 1.24 mg/dL 1.50(H) 1.59(H) 1.27(H)  Sodium 135 - 145 mmol/L 138 139 138  Potassium 3.5 - 5.1 mmol/L 3.3(L) 3.4(L) 3.2(L)  Chloride 98 - 111 mmol/L 105 104 104  CO2 22 - 32 mmol/L 25 26 25   Calcium 8.9 - 10.3 mg/dL 9.2 9.6 9.4  Total Protein 6.5 - 8.1 g/dL - - -  Total Bilirubin 0.3 - 1.2 mg/dL - - -  Alkaline Phos 38 - 126 U/L - - -  AST 15 - 41 U/L - - -  ALT 0 - 44 U/L - - -    IMAGING past 24 hours  PHYSICAL EXAM General: Pleasant obese middle-aged African-American male awake alert in no distress HEENT: Normocephalic, atraumatic Cardiovascular: Regular rhythm Respiratory: No extra work of breathing on room air Extremities:  Appear warm and well perfused with bilateral lower extremity swelling. Neurological exam Awake alert oriented x4 Speech clear and appropriate in context Reduced attention concentration Cranial nerves: Pupils equal round react light, extraocular movements intact, right gaze deviation but is able to look to the left past midline.  Face appears asymmetric with left lower facial weakness, left homonymous hemianopsia noted on exam. Motor examination: Left upper extremity 1/5, left lower extremity 3/5 (drift).  Right upper and lower extremity are full strength. Sensation diminished on the left and also extinction on double simultaneous stimulation. Coordination: No dysmetria on the right.  Unable to perform the left.  ASSESSMENT/PLAN Matthew Wise is a 60 y.o. male with history of hypertension, hyperlipidemia, DVT/PE on Eliquis, sickle cell disease presenting with sudden onset of left arm and leg weakness along with left facial droop with a last known well of 1500 hrs. on 02/02/2021.  Seen by telemedicine neurology and transported to [facility] for higher level of care due to right MCA M1 filling deficit and perfusion deficit on perfusion imaging-see details below.  Patient was seen in December/January as well as again in February for right MCA strokes-first 1 in  the right M1 superior division and the second 1 involving patchy distribution of both superior and inferior division of the right MCAs. Positive TCD Bubble study with delayed HITS noted after valasava  indicative of a small intrapulmonary shunt likely with further investigation of CT chest showing no PE, AVM or fistula.    This time around, CTA showed abrupt occlusion of the right M1 segment which is new as compared to the prior CTA from February 2022 suggesting underlying stenosis on the prior CTA that probably now occluded. There was an acute core infarct in the posterior right frontoparietal region likely corresponding with the  completed infarct as seen on prior MRI but there was presumably penumbra of 57 cc considered tissue at risk for which he was taken for emergent IR thrombectomy.  MRI - large acute RMCA infarct with peticheal hemorrhage, minimal leftward shift, small acute left cerebellar infarct, new small acute/subacute left occipital, left parietal and left frontal lobe infarcts. Could be periprocedural but very suspicious for a cardioembolic etiology.  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  Suspect acute infarction of the right frontoparietal lobes posterior superior to chronic infarct with probable involvement of lateral precentral and postcentral gyri.  CTA head & neck-abrupt right M1 segment occlusion new as compared to CTA from 01/25/2021.  This is suggestive of an underlying stenosis at this level on prior CTA from 01/25/2021.  Acute cord infarct at the posterior right frontoparietal region likely corresponding with the completed infarct as seen on prior MRI.  This may have partially expanded in the interim.  Surrounding ischemic penumbra 57 cc-consider tissue address.  Fetal PCA bilaterally and overall diminutive vertebrobasilar system.  Technically limited by contrast bolus  CT perfusion -57 cc penumbra-in hindsight likely overestimating.  CT head after the procedure 9 AM today-progressive nonhemorrhagic cortical infarct in the posterior right MCA territory with further loss of gray-white differentiation.  Progressive edema and mass-effect with 2 to 3 mm midline shift.  Hyperdense areas in the sulci likely reflective of pseudosubarachnoid hemorrhage with areas of cortex adjacent to 1 another.  No definitive hemorrhage is present.  Stable remote infarct of the anterior right MCA territory.  MRI shows large acute RMCA infarct with peticheal hemorrhage, minimal leftward shift, small acute left cerebellar infarct, new small  acute/subacute left occipital, left parietal and left frontal lobe infarcts.  Could be periprocedural but very suspicious for a cardioembolic etiology-also due to increased LA size seen on TEE.  2D Echo completed on 01/22/2021 with LVEF 55 to 60%, left ventricle with regional wall motion abnormalities.  Left ventricular hypertrophy-mild.  Diastolic parameters of the left ventricle indeterminate.  Right ventricular systolic function normal.  Mitral valve normal.  Aortic valve is tricuspid and normal.  Left atrium normal.  LDL 25  HgbA1c 5.9  VTE prophylaxis -SCDs for now.  May start chemical prophylaxis after MRI is completed and shows no large bleed.    Diet   DIET DYS 3 Room service appropriate? Yes; Fluid consistency: Thin    Eliquis (apixaban) daily prior to admission, on low-dose aspirin and will discontinue heparin drip for DVT. Started on Pradaxa 150mg  bid on 02/09/2021 and continue Aspirin 81mg  daily  Therapy recommendations: CIR  Disposition: CIR Pending insurance approval  Cerebral Edema- in the setting of large R MCA stroke, with subtle worsening on repeat  imaging 3/8 with increased midline shift. - Repeat CT head 3/8 shows increased size of large  right MCA territory infarct with unchanged subarachnoid hemorrhage. There is increased mass effect on the right lateral ventricle and 3 mm of left midline shift. A small left cerebellar infarct is also noted.  - Weaned off HTS On 3/11  Seizure-like activity noted clinically and confirmed on EEG. - Left upper extremity shaking - EEG concerning for multiple seizures.   - Loaded with Keppra 3 g followed by Keppra 500 twice daily on 3/9 - Good control of seizures as noted on  EEG with no sz - Continue Keppra   Hypertension, Hypertensive urgency - Amlodipine 10 mg daily.   - Lisinopril 40 mg daily.   - Hydralazine titrated to 100mg  TID 3/11 - Goal SBP <160  - Long-term BP goal normotensive - Monitor kidney function with daily  BMP  Bradycardia - Appears asymptomatic - slightly improving today - continue to monitor  Hyperlipidemia - Home meds:  Lipitor 40 - LDL 25, goal < 70 - Will reduce to Lipitor 20 - Continue statin at discharge  Leg swelling - h/o DVT bilat LE dopplers +right common femoral vein, and right femoral vein, left femoral vein, and left popliteal vein.  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  - Anticoagulation d/t history of DVT, presence of the chronic DVTs as well as personal history of PE. This is with the understanding that he is at high risk for bleeding. - transitioned to Pradaxa 150mg  bid (02/09/21) (pt previously on Apixaban for CVA and Xarelto)  CKD 3 -Ctn baseline around 1.66 -Ctn 1.83->1.72->1.64->1.35->1.27->1.5 -Monitor with daily BMP as BP regimen is titrated  Hypokalemia - Monitor and replete  - 3.8->3.2->3.4 - potassium chloride 60 x1 today   Other Stroke Risk Factors  Cigarette smoker advised to stop smoking  Obesity, Body mass index is 33.94 kg/m., BMI >/= 30 associated with increased stroke risk, recommend weight loss, diet and exercise as appropriate   Hx stroke/TIA  OSA  Other risk factors  HgbA1c 5.9, goal < 7.0  Continue ongoing therapies.  Await transfer to inpatient rehab after insurance approval and bed available.  Trial of CPAP mask tonight for participation in the sleep smart study.  Greater than 50% time during this 25-minute visit was spent in counseling and coordination of care and discussion of care team.  Antony Contras, [facility] day # 8

## 2021-02-10 NOTE — Progress Notes (Signed)
Physical Therapy Treatment Patient Details Name: Matthew Wise MRN: [id number] DOB: [birth year] Today's Date: 02/10/2021    History of Present Illness The pt is a 60 yo male presenting from home on 3/6 with L-sided weakness. Imaging revealed occlusion of R M1 segment and infarct in posterior R frontoparietal region. Pt is now s/p arteriogram with revascularization of R MCA M1 segment. 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 The pt has been reccently admitted (2/22-2/28) with R MCA stroke and was d/c home. PMH includes: CVA, CKD III, DVT, HTN, PE, HLD.    PT Comments    Pt progressing with PT/OT today, very motivated to move and responsive to therapist's instructions.  Pt mobilizing to EOB with mod vc's and min A +2 to manage LLE and trunk as well as facilitate WB'ing through LUE. Pt practiced bed>recliner transfer to R with lateral scoot, mod A from front to wt shift to R, min A from behind for safety. Pt sit>stand from recliner with mod A +2, needed control at L knee to prevent buckling and hyperextension. Worked on standing balance and wt-shifting for pregait, no L quad activation noted in standing. Continue to recommend CIR level therapies to maximize pt's function and independence. PT will continue to follow.   Follow Up Recommendations  CIR     Equipment Recommendations  Other (comment) (defer to post acute)    Recommendations for Other Services Rehab consult     Precautions / Restrictions Precautions Precautions: Fall Precaution Comments: L hemiplegia Restrictions Weight Bearing Restrictions: No    Mobility  Bed Mobility Overal bed mobility: Needs Assistance Bed Mobility: Supine to Sit     Supine to sit: Min assist;HOB elevated;+2 for physical assistance     General bed mobility comments: Pt rolls to L with R hand to L rail with supervision. Pt needs cues to attend to LLE but is able to verbalize that he can assist it with RLE. Min A needed for LLE off  bed with min A at trunk as well and +2 to work on [employer] through [place] as he rose    Transfers Overall transfer level: Needs assistance Equipment used: 2 person hand held assist Transfers: Sit to/from Stand;Lateral/Scoot Transfers Sit to Stand: Mod assist;+2 physical assistance Stand pivot transfers:  (with stedy)      Lateral/Scoot Transfers: Mod assist;+2 physical assistance General transfer comment: pt performed R lateral scoot transfer bed>recliner with mod A from at hips and min A from behind for safety. Pt needs cues to use RLE and had difficulty moving hips to R, tends to lift fwd instead. From recliner pt performed sit>stand at sink with mod on R side and max A L knee to prevent buckling or hyperextension. No quad activation noted.  Ambulation/Gait             General Gait Details: pregait activity in standing including standing wt shifting   Stairs             Wheelchair Mobility    Modified Rankin (Stroke Patients Only) Modified Rankin (Stroke Patients Only) Pre-Morbid Rankin Score: Slight disability Modified Rankin: Severe disability     Balance Overall balance assessment: Needs assistance Sitting-balance support: Single extremity supported;Feet supported Sitting balance-Leahy Scale: Good Sitting balance - Comments: pt able to correct L lean when cued, unaware on his own. Worked on L elbow lean and returning to upright sitting 5x. Pt beginning to have flexor/ extensor tone L elbow. Postural control: Left lateral lean Standing balance support: Bilateral  upper extremity supported;During functional activity Standing balance-Leahy Scale: Poor Standing balance comment: UE support and mod/ max A +2 needed to maintain midline standing and allow for functional use of RUE                            Cognition Arousal/Alertness: Awake/alert Behavior During Therapy: Flat affect Overall Cognitive Status: Impaired/Different from baseline Area of  Impairment: Problem solving;Safety/judgement                       Following Commands: Follows one step commands consistently Safety/Judgement: Decreased awareness of safety;Decreased awareness of deficits   Problem Solving: Slow processing;Decreased initiation;Requires tactile cues General Comments: pt presenting with flat affect. pt following simple commands consistently, benefits from multimodal cues at times in relation to posture and maintaining midline. Favors R gaze but smoothly crosses midline to L when cued      Exercises      General Comments General comments (skin integrity, edema, etc.): NAD throughout      Pertinent Vitals/Pain Pain Assessment: No/denies pain    Home Living                      Prior Function            PT Goals (current goals can now be found in the care plan section) Acute Rehab PT Goals Patient Stated Goal: improve mobility PT Goal Formulation: With patient Time For Goal Achievement: 02/20/21 Potential to Achieve Goals: Good Progress towards PT goals: Progressing toward goals    Frequency    Min 4X/week      PT Plan Current plan remains appropriate    Co-evaluation PT/OT/SLP Co-Evaluation/Treatment: Yes Reason for Co-Treatment: Complexity of the patient's impairments (multi-system involvement);For patient/therapist safety PT goals addressed during session: Mobility/safety with mobility;Balance;Strengthening/ROM        AM-PAC PT "6 Clicks" Mobility   Outcome Measure  Help needed turning from your back to your side while in a flat bed without using bedrails?: A Little Help needed moving from lying on your back to sitting on the side of a flat bed without using bedrails?: A Little Help needed moving to and from a bed to a chair (including a wheelchair)?: A Lot Help needed standing up from a chair using your arms (e.g., wheelchair or bedside chair)?: A Lot Help needed to walk in hospital room?: Total Help  needed climbing 3-5 steps with a railing? : Total 6 Click Score: 12    End of Session Equipment Utilized During Treatment: Gait belt Activity Tolerance: Patient tolerated treatment well Patient left: in chair;with call bell/phone within reach;with chair alarm set Nurse Communication: Mobility status;Need for lift equipment (stedy) PT Visit Diagnosis: Muscle weakness (generalized) (M62.81);Other symptoms and signs involving the nervous system (R29.898);Other abnormalities of gait and mobility (R26.89)     Time: 6754-4920 PT Time Calculation (min) (ACUTE ONLY): 30 min  Charges:  $Therapeutic Activity: 8-22 mins                     Leighton Roach, [place]  Pager [phone] Office [place] 02/10/2021, 9:54 AM

## 2021-02-11 DIAGNOSIS — I6601 Occlusion and stenosis of right middle cerebral artery: Secondary | ICD-10-CM | POA: Diagnosis not present

## 2021-02-11 LAB — CBC
HCT: 36.7 % — ABNORMAL LOW (ref 39.0–52.0)
Hemoglobin: 13 g/dL (ref 13.0–17.0)
MCH: 28.8 pg (ref 26.0–34.0)
MCHC: 35.4 g/dL (ref 30.0–36.0)
MCV: 81.4 fL (ref 80.0–100.0)
Platelets: 237 10*3/uL (ref 150–400)
RBC: 4.51 MIL/uL (ref 4.22–5.81)
RDW: 13.2 % (ref 11.5–15.5)
WBC: 5.5 10*3/uL (ref 4.0–10.5)
nRBC: 0 % (ref 0.0–0.2)

## 2021-02-11 LAB — BASIC METABOLIC PANEL
Anion gap: 9 (ref 5–15)
BUN: 9 mg/dL (ref 6–20)
CO2: 25 mmol/L (ref 22–32)
Calcium: 9.5 mg/dL (ref 8.9–10.3)
Chloride: 101 mmol/L (ref 98–111)
Creatinine, Ser: 1.31 mg/dL — ABNORMAL HIGH (ref 0.61–1.24)
GFR, Estimated: >60 mL/min (ref >60)
Glucose, Bld: 94 mg/dL (ref 70–99)
Potassium: 3.1 mmol/L — ABNORMAL LOW (ref 3.5–5.1)
Sodium: 135 mmol/L (ref 135–145)

## 2021-02-11 MED ORDER — POTASSIUM CHLORIDE CRYS ER 20 MEQ PO TBCR: 40.0000 meq | EXTENDED_RELEASE_TABLET | Freq: Two times a day (BID) | ORAL | Status: DC

## 2021-02-11 MED ORDER — PHENYLEPHRINE HCL-NACL 10-0.9 MG/250ML-% IV SOLN
INTRAVENOUS | Status: AC
  Filled 2021-02-11: qty 500

## 2021-02-11 MED ORDER — POTASSIUM CHLORIDE CRYS ER 20 MEQ PO TBCR
40.0000 meq | EXTENDED_RELEASE_TABLET | Freq: Two times a day (BID) | ORAL | Status: DC
  Administered 2021-02-11 – 2021-02-12 (×3): 40 meq via ORAL
  Filled 2021-02-11 (×3): qty 2

## 2021-02-11 NOTE — Progress Notes (Signed)
Inpatient Rehabilitation Admissions Coordinator  I have received approval for CIR admit from insurance. I will follow up tomorrow for admission.Noted slid from chair today.  Danne Baxter, RN, MSN Rehab Admissions Coordinator [phone] 02/11/2021 5:22 PM

## 2021-02-11 NOTE — Plan of Care (Signed)

## 2021-02-11 NOTE — Care Management Important Message (Signed)
Important Message  Patient Details  Name: Matthew Wise MRN: [id number] Date of Birth: 1961-05-14   Medicare Important Message Given:  Yes     Jushua Waltman [address] 02/11/2021, 10:51 AM

## 2021-02-11 NOTE — Progress Notes (Signed)
Pt noted on floor by RN, Nurse Tech, and physical therapy staff, chair alarm was in place and alarmed, by time staff arrived he had slid from chair to the floor. He stated he knew he was supposed to call out for assistance, but tried to reposition. Pt reports no pain or discomfort. Assisted to bed, lowest position, alarm on. Dr. Leonie Man notified, sister Gerald Kuehl notified. Staff will continue to monitor.

## 2021-02-11 NOTE — Progress Notes (Signed)
STROKE TEAM PROGRESS NOTE   INTERVAL HISTORY  Therapists recommending CIR awaiting insurance approval Neurological exam is unchanged today.  No complaints.  Vital signs are stable Patient did participate in the sleep smart study and was able to tolerate the mask but unfortunately had too much of central sleep apnea to qualify for the study and he is a screen failure  Vitals:   02/10/21 1941 02/10/21 2324 02/11/21 0400 02/11/21 0721  BP: 102/74 (!) 149/104 140/81 (!) 156/88  Pulse: 66 64 60 60  Resp: 17 17 18 18   Temp: 97.9 F (36.6 C) 98.2 F (36.8 C) 97.9 F (36.6 C) 98.4 F (36.9 C)  TempSrc: Oral Oral Oral Oral  SpO2: 100% 100% 96% 99%  Weight:      Height:       CBC    Component Value Date/Time   WBC 5.5 02/11/2021 0240   RBC 4.51 02/11/2021 0240   HGB 13.0 02/11/2021 0240   HCT 36.7 (L) 02/11/2021 0240   PLT 237 02/11/2021 0240   MCV 81.4 02/11/2021 0240   MCH 28.8 02/11/2021 0240   MCHC 35.4 02/11/2021 0240   RDW 13.2 02/11/2021 0240   LYMPHSABS 1.4 02/04/2021 0930   MONOABS 0.9 02/04/2021 0930   EOSABS 0.1 02/04/2021 0930   BASOSABS 0.0 02/04/2021 0930   CMP Latest Ref Rng & Units 02/11/2021 02/10/2021 02/09/2021  Glucose 70 - 99 mg/dL 94 94 87  BUN 6 - 20 mg/dL 9 13 14   Creatinine 0.61 - 1.24 mg/dL 1.31(H) 1.50(H) 1.59(H)  Sodium 135 - 145 mmol/L 135 138 139  Potassium 3.5 - 5.1 mmol/L 3.1(L) 3.3(L) 3.4(L)  Chloride 98 - 111 mmol/L 101 105 104  CO2 22 - 32 mmol/L 25 25 26   Calcium 8.9 - 10.3 mg/dL 9.5 9.2 9.6  Total Protein 6.5 - 8.1 g/dL - - -  Total Bilirubin 0.3 - 1.2 mg/dL - - -  Alkaline Phos 38 - 126 U/L - - -  AST 15 - 41 U/L - - -  ALT 0 - 44 U/L - - -    IMAGING past 24 hours  PHYSICAL EXAM General: Pleasant obese middle-aged African-American male awake alert in no distress HEENT: Normocephalic, atraumatic Cardiovascular: Regular rhythm Respiratory: No extra work of breathing on room air Extremities: Appear warm and well perfused with  bilateral lower extremity swelling. Neurological exam Awake alert oriented x4 Speech clear and appropriate in context Reduced attention concentration Cranial nerves: Pupils equal round react light, extraocular movements intact, right gaze deviation but is able to look to the left past midline.  Face appears asymmetric with left lower facial weakness, left homonymous hemianopsia noted on exam. Motor examination: Left upper extremity 1/5, left lower extremity 3/5 (drift).  Right upper and lower extremity are full strength. Sensation diminished on the left and also extinction on double simultaneous stimulation. Coordination: No dysmetria on the right.  Unable to perform the left.  ASSESSMENT/PLAN [name] is a 60 y.o. male with history of hypertension, hyperlipidemia, DVT/PE on Eliquis, sickle cell disease presenting with sudden onset of left arm and leg weakness along with left facial droop with a last known well of 1500 hrs. on 02/02/2021.  Seen by telemedicine neurology and transported to [facility] for higher level of care due to right MCA M1 filling deficit and perfusion deficit on perfusion imaging-see details below.  Patient was seen in December/January as well as again in February for right MCA strokes-first 1 in the right M1  superior division and the second 1 involving patchy distribution of both superior and inferior division of the right MCAs. Positive TCD Bubble study with delayed HITS noted after valasava indicative of a small intrapulmonary shunt likely with further investigation of CT chest showing no PE, AVM or fistula.    This time around, CTA showed abrupt occlusion of the right M1 segment which is new as compared to the prior CTA from February 2022 suggesting underlying stenosis on the prior CTA that probably now occluded. There was an acute core infarct in the posterior right frontoparietal region likely corresponding with the completed infarct as seen on prior MRI  but there was presumably penumbra of 57 cc considered tissue at risk for which he was taken for emergent IR thrombectomy.  MRI - large acute RMCA infarct with peticheal hemorrhage, minimal leftward shift, small acute left cerebellar infarct, new small acute/subacute left occipital, left parietal and left frontal lobe infarcts. Could be periprocedural but very suspicious for a cardioembolic etiology.  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  Suspect acute infarction of the right frontoparietal lobes posterior superior to chronic infarct with probable involvement of lateral precentral and postcentral gyri.  CTA head & neck-abrupt right M1 segment occlusion new as compared to CTA from 01/25/2021.  This is suggestive of an underlying stenosis at this level on prior CTA from 01/25/2021.  Acute cord infarct at the posterior right frontoparietal region likely corresponding with the completed infarct as seen on prior MRI.  This may have partially expanded in the interim.  Surrounding ischemic penumbra 57 cc-consider tissue address.  Fetal PCA bilaterally and overall diminutive vertebrobasilar system.  Technically limited by contrast bolus  CT perfusion -57 cc penumbra-in hindsight likely overestimating.  CT head after the procedure -progressive nonhemorrhagic cortical infarct in the posterior right MCA territory with further loss of gray-white differentiation.  Progressive edema and mass-effect with 2 to 3 mm midline shift.  Hyperdense areas in the sulci likely reflective of pseudosubarachnoid hemorrhage with areas of cortex adjacent to 1 another.  No definitive hemorrhage is present.  Stable remote infarct of the anterior right MCA territory.  MRI shows large acute RMCA infarct with peticheal hemorrhage, minimal leftward shift, small acute left cerebellar infarct, new small acute/subacute left occipital, left parietal and left frontal  lobe infarcts.  Could be periprocedural but very suspicious for a cardioembolic etiology-also due to increased LA size seen on TEE.  2D Echo completed on 01/22/2021 with LVEF 55 to 60%, left ventricle with regional wall motion abnormalities.  Left ventricular hypertrophy-mild.  Diastolic parameters of the left ventricle indeterminate.  Right ventricular systolic function normal.  Mitral valve normal.  Aortic valve is tricuspid and normal.  Left atrium normal.  LDL 25  HgbA1c 5.9  VTE prophylaxis -SCDs for now.  May start chemical prophylaxis after MRI is completed and shows no large bleed.    Diet   DIET DYS 3 Room service appropriate? Yes; Fluid consistency: Thin    Eliquis (apixaban) daily prior to admission, Started on Pradaxa 150mg  bid on 02/09/2021 and continue Aspirin 81mg  daily  Therapy recommendations: CIR  Disposition: CIR Pending insurance approval  Cerebral Edema- in the setting of large R MCA stroke, with subtle worsening on repeat  imaging 3/8 with increased midline shift. - Repeat CT head 3/8 shows increased size of large right MCA territory infarct with unchanged subarachnoid hemorrhage. There is increased mass effect on the right  lateral ventricle and 3 mm of left midline shift. A small left cerebellar infarct is also noted.  - Weaned off HTS On 3/11  Seizure-like activity noted clinically and confirmed on EEG. - Left upper extremity shaking - EEG concerning for multiple seizures.   - Loaded with Keppra 3 g followed by Keppra 500 twice daily on 3/9 - Good control of seizures as noted on  EEG with no sz - Continue Keppra   Hypertension, Hypertensive urgency - Amlodipine 10 mg daily.   - Lisinopril 40 mg daily.   - Hydralazine titrated to 75mg  TID  - Goal SBP <160  - Long-term BP goal normotensive - Monitor kidney function with daily BMP  Bradycardia - Appears asymptomatic - improving - continue to monitor  Hyperlipidemia - Home meds:  Lipitor 40 - LDL 25,  goal < 70 - Will reduce to Lipitor 20 - Continue statin at discharge  Leg swelling - h/o DVT bilat LE dopplers +right common femoral vein, and right femoral vein, left femoral vein, and left popliteal vein.  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  - Anticoagulation d/t history of DVT, presence of the chronic DVTs as well as personal history of PE. This is with the understanding that he is at high risk for bleeding. - transitioned to Pradaxa 150mg  bid (02/09/21) (pt previously on Apixaban for CVA and Xarelto)  CKD 3 -Ctn baseline around 1.66 -Ctn 1.83->1.72->1.64->1.35->1.27->1.5->1.31 -Monitor with daily BMP as BP regimen is titrated  Hypokalemia - Monitor and replete  - 3.8->3.2->3.4->3.1 - potassium chloride 40mg  bid scheduled   Other Stroke Risk Factors  Cigarette smoker advised to stop smoking  Obesity, Body mass index is 33.94 kg/m., BMI >/= 30 associated with increased stroke risk, recommend weight loss, diet and exercise as appropriate   Hx stroke/TIA  OSA  Other risk factors  HgbA1c 5.9, goal < 7.0  Continue ongoing stroke management and therapies.  Medically stable to be transferred to inpatient rehab when bed available.  Patient will be referred as an outpatient to sleep lab for treatment of his central sleep apnea after discharge.  Greater than 50% time during this 25-minute visit was spent patient care about the stroke with sleep apnea   Antony Contras MD

## 2021-02-11 NOTE — Progress Notes (Signed)
Physical Therapy Treatment Patient Details Name: Matthew Wise MRN: [id number] DOB: 03/14/1961 Today's Date: 02/11/2021    History of Present Illness The pt is a 60 yo male presenting from home on 3/6 with L-sided weakness. Imaging revealed occlusion of R M1 segment and infarct in posterior R frontoparietal region. Pt is now s/p arteriogram with revascularization of R MCA M1 segment. 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 The pt has been reccently admitted (2/22-2/28) with R MCA stroke and was d/c home. PMH includes: CVA, CKD III, DVT, HTN, PE, HLD.    PT Comments    Pt speaking with SLP prior to session and talkative beginning of session but with decreased verbalization as he begins to mobilize. Unsure if this is due to fatigue with mobility or discouragement of his limitations. Used stedy to take pt to sink mirror and work on midline sit>stand. Pt needed more assist to maintain standing with subsequent trials due to fatigue. Mod A+2 needed for sit<>stand. Continue to recommend CIR at d/c. PT will continue to follow.   Follow Up Recommendations  CIR     Equipment Recommendations  Other (comment) (defer to post acute)    Recommendations for Other Services Rehab consult     Precautions / Restrictions Precautions Precautions: Fall Precaution Comments: L hemiplegia Restrictions Weight Bearing Restrictions: No    Mobility  Bed Mobility Overal bed mobility: Needs Assistance Bed Mobility: Rolling;Sidelying to Sit Rolling: Supervision Sidelying to sit: Mod assist       General bed mobility comments: Pt rolls to L with R hand to L rail with supervision. Pt attempted to move RLE with L but needed mod A to complete B LE's off bed and mod A at trunk to rise with use of rail    Transfers Overall transfer level: Needs assistance Equipment used: Ambulation equipment used Transfers: Sit to/from Stand Sit to Stand: Mod assist;+2 physical assistance Stand pivot  transfers: Total assist;From elevated surface (with stedy)       General transfer comment: worked on sit<>stand multiple times within stedy, pt needing more assist after 3 reps due to fatigue. Performed in front of mirror working on maintaining midline during rising.  Ambulation/Gait                 Stairs             Wheelchair Mobility    Modified Rankin (Stroke Patients Only) Modified Rankin (Stroke Patients Only) Pre-Morbid Rankin Score: Slight disability Modified Rankin: Severe disability     Balance Overall balance assessment: Needs assistance Sitting-balance support: Single extremity supported;Feet supported Sitting balance-Leahy Scale: Good Sitting balance - Comments: vc's for midline sitting. Pt with more tone LUE today compared to yesterday Postural control: Left lateral lean Standing balance support: Bilateral upper extremity supported;During functional activity Standing balance-Leahy Scale: Poor Standing balance comment: UE support and mod/ max A +2 needed to maintain midline standing and allow for functional use of RUE                            Cognition Arousal/Alertness: Awake/alert Behavior During Therapy: Flat affect Overall Cognitive Status: Impaired/Different from baseline Area of Impairment: Problem solving;Safety/judgement;Following commands                       Following Commands: Follows one step commands consistently;Follows one step commands with increased time;Follows multi-step commands with increased time Safety/Judgement: Decreased awareness of safety;Decreased awareness  of deficits   Problem Solving: Slow processing;Decreased initiation;Requires tactile cues General Comments: unable to discern if pt's decreased verbalization during session is due to fatigue from the effort or depressed mood from experiencing his deficits more notably during therapy sessions      Exercises      General Comments General  comments (skin integrity, edema, etc.): pt left in chair with chair alarm set      Pertinent Vitals/Pain Pain Assessment: No/denies pain Faces Pain Scale: No hurt    Home Living                      Prior Function            PT Goals (current goals can now be found in the care plan section) Acute Rehab PT Goals Patient Stated Goal: improve mobility PT Goal Formulation: With patient Time For Goal Achievement: 02/20/21 Potential to Achieve Goals: Good Progress towards PT goals: Progressing toward goals    Frequency    Min 4X/week      PT Plan Current plan remains appropriate    Co-evaluation              AM-PAC PT "6 Clicks" Mobility   Outcome Measure  Help needed turning from your back to your side while in a flat bed without using bedrails?: A Little Help needed moving from lying on your back to sitting on the side of a flat bed without using bedrails?: A Little Help needed moving to and from a bed to a chair (including a wheelchair)?: A Lot Help needed standing up from a chair using your arms (e.g., wheelchair or bedside chair)?: A Lot Help needed to walk in hospital room?: Total Help needed climbing 3-5 steps with a railing? : Total 6 Click Score: 12    End of Session Equipment Utilized During Treatment: Gait belt Activity Tolerance: Patient tolerated treatment well Patient left: in chair;with call bell/phone within reach;with chair alarm set Nurse Communication: Mobility status;Need for lift equipment (stedy) PT Visit Diagnosis: Muscle weakness (generalized) (M62.81);Other symptoms and signs involving the nervous system (R29.898);Other abnormalities of gait and mobility (R26.89)     Time: 7356-7014 PT Time Calculation (min) (ACUTE ONLY): 27 min  Charges:  $Therapeutic Activity: 23-37 mins                     Leighton Roach, [place]  Pager [phone] Office [place] 02/11/2021, 3:41  PM

## 2021-02-11 NOTE — Progress Notes (Signed)
  Speech Language Pathology Treatment: Dysphagia;Cognitive-Linquistic  Patient Details Name: Matthew Wise MRN: [id number] DOB: [birth year] Today's Date: 02/11/2021 Time: 6962-9528 SLP Time Calculation (min) (ACUTE ONLY): 16 min  Assessment / Plan / Recommendation Clinical Impression  Pt consumed soft solids and thin liquids with Min A provided as pt problem solved through how to open packages using only his R hand. He has mild amounts of L pocketing during intake, but what is pocketed is not masticated and he does not show emergent awareness of it until prompted by SLP. Pt was able to find all items on the L side of his tray when asked, but could not find his phone that was resting on his L arm. Recommend continuing current diet with ongoing SLP f/u for swallowing and cognition.     HPI HPI: Matthew Wise is a 60 y.o. 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 right posterior temporal and parietal lobe infarct consistent with an inferior division right MCA infarction.  Underwent RT common carotid arteriogram followed by revascularization of occluded Rt MCA      SLP Plan  Continue with current plan of care       Recommendations  Diet recommendations: Dysphagia 3 (mechanical soft);Thin liquid Liquids provided via: Cup;Straw Medication Administration: Whole meds with liquid Supervision: Patient able to self feed Compensations: Minimize environmental distractions Postural Changes and/or Swallow Maneuvers: Seated upright 90 degrees                Oral Care Recommendations: Oral care BID Follow up Recommendations: Inpatient Rehab SLP Visit Diagnosis: Dysphagia, oropharyngeal phase (R13.12) Plan: Continue with current plan of care       GO                Osie Bond., M.A. [facility] Pager [phone] Office  [phone]  02/11/2021, 12:45 PM

## 2021-02-12 ENCOUNTER — Other Ambulatory Visit: Payer: Self-pay

## 2021-02-12 ENCOUNTER — Encounter (HOSPITAL_COMMUNITY): Payer: Self-pay | Admitting: Physical Medicine & Rehabilitation

## 2021-02-12 ENCOUNTER — Inpatient Hospital Stay (HOSPITAL_COMMUNITY)
Admission: RE | Admit: 2021-02-12 | Discharge: 2021-03-12 | DRG: 057 | Disposition: A | Discharge status: Home Health | Payer: Medicare Other | Source: Intra-hospital | Attending: Physical Medicine & Rehabilitation | Admitting: Physical Medicine & Rehabilitation

## 2021-02-12 DIAGNOSIS — I952 Hypotension due to drugs: Secondary | ICD-10-CM | POA: Diagnosis not present

## 2021-02-12 DIAGNOSIS — Z7901 Long term (current) use of anticoagulants: Secondary | ICD-10-CM | POA: Diagnosis not present

## 2021-02-12 DIAGNOSIS — N3941 Urge incontinence: Secondary | ICD-10-CM | POA: Diagnosis not present

## 2021-02-12 DIAGNOSIS — R350 Frequency of micturition: Secondary | ICD-10-CM | POA: Diagnosis not present

## 2021-02-12 DIAGNOSIS — I69398 Other sequelae of cerebral infarction: Secondary | ICD-10-CM

## 2021-02-12 DIAGNOSIS — Z833 Family history of diabetes mellitus: Secondary | ICD-10-CM

## 2021-02-12 DIAGNOSIS — I69354 Hemiplegia and hemiparesis following cerebral infarction affecting left non-dominant side: Secondary | ICD-10-CM

## 2021-02-12 DIAGNOSIS — Z825 Family history of asthma and other chronic lower respiratory diseases: Secondary | ICD-10-CM

## 2021-02-12 DIAGNOSIS — R7303 Prediabetes: Secondary | ICD-10-CM | POA: Diagnosis present

## 2021-02-12 DIAGNOSIS — G40909 Epilepsy, unspecified, not intractable, without status epilepticus: Secondary | ICD-10-CM | POA: Diagnosis present

## 2021-02-12 DIAGNOSIS — R1310 Dysphagia, unspecified: Secondary | ICD-10-CM | POA: Diagnosis present

## 2021-02-12 DIAGNOSIS — I10 Essential (primary) hypertension: Secondary | ICD-10-CM

## 2021-02-12 DIAGNOSIS — R252 Cramp and spasm: Secondary | ICD-10-CM | POA: Diagnosis present

## 2021-02-12 DIAGNOSIS — E785 Hyperlipidemia, unspecified: Secondary | ICD-10-CM | POA: Diagnosis not present

## 2021-02-12 DIAGNOSIS — Z86718 Personal history of other venous thrombosis and embolism: Secondary | ICD-10-CM

## 2021-02-12 DIAGNOSIS — L8990 Pressure ulcer of unspecified site, unspecified stage: Secondary | ICD-10-CM | POA: Diagnosis not present

## 2021-02-12 DIAGNOSIS — E782 Mixed hyperlipidemia: Secondary | ICD-10-CM | POA: Diagnosis present

## 2021-02-12 DIAGNOSIS — N1832 Chronic kidney disease, stage 3b: Secondary | ICD-10-CM | POA: Diagnosis not present

## 2021-02-12 DIAGNOSIS — F17210 Nicotine dependence, cigarettes, uncomplicated: Secondary | ICD-10-CM | POA: Diagnosis not present

## 2021-02-12 DIAGNOSIS — I129 Hypertensive chronic kidney disease with stage 1 through stage 4 chronic kidney disease, or unspecified chronic kidney disease: Secondary | ICD-10-CM | POA: Diagnosis not present

## 2021-02-12 DIAGNOSIS — R0981 Nasal congestion: Secondary | ICD-10-CM | POA: Diagnosis not present

## 2021-02-12 DIAGNOSIS — L89892 Pressure ulcer of other site, stage 2: Secondary | ICD-10-CM | POA: Diagnosis not present

## 2021-02-12 DIAGNOSIS — I6601 Occlusion and stenosis of right middle cerebral artery: Secondary | ICD-10-CM | POA: Diagnosis not present

## 2021-02-12 DIAGNOSIS — I63511 Cerebral infarction due to unspecified occlusion or stenosis of right middle cerebral artery: Secondary | ICD-10-CM

## 2021-02-12 DIAGNOSIS — I69391 Dysphagia following cerebral infarction: Secondary | ICD-10-CM | POA: Diagnosis not present

## 2021-02-12 DIAGNOSIS — Z823 Family history of stroke: Secondary | ICD-10-CM

## 2021-02-12 DIAGNOSIS — Z743 Need for continuous supervision: Secondary | ICD-10-CM | POA: Diagnosis not present

## 2021-02-12 DIAGNOSIS — Z79899 Other long term (current) drug therapy: Secondary | ICD-10-CM | POA: Diagnosis not present

## 2021-02-12 DIAGNOSIS — Z716 Tobacco abuse counseling: Secondary | ICD-10-CM

## 2021-02-12 DIAGNOSIS — N1831 Chronic kidney disease, stage 3a: Secondary | ICD-10-CM | POA: Diagnosis not present

## 2021-02-12 DIAGNOSIS — E039 Hypothyroidism, unspecified: Secondary | ICD-10-CM | POA: Diagnosis not present

## 2021-02-12 DIAGNOSIS — Z808 Family history of malignant neoplasm of other organs or systems: Secondary | ICD-10-CM

## 2021-02-12 DIAGNOSIS — N3289 Other specified disorders of bladder: Secondary | ICD-10-CM | POA: Diagnosis not present

## 2021-02-12 DIAGNOSIS — G8190 Hemiplegia, unspecified affecting unspecified side: Secondary | ICD-10-CM | POA: Diagnosis not present

## 2021-02-12 DIAGNOSIS — Z8261 Family history of arthritis: Secondary | ICD-10-CM

## 2021-02-12 DIAGNOSIS — G8110 Spastic hemiplegia affecting unspecified side: Secondary | ICD-10-CM

## 2021-02-12 DIAGNOSIS — D571 Sickle-cell disease without crisis: Secondary | ICD-10-CM | POA: Diagnosis present

## 2021-02-12 DIAGNOSIS — R531 Weakness: Secondary | ICD-10-CM | POA: Diagnosis not present

## 2021-02-12 DIAGNOSIS — E038 Other specified hypothyroidism: Secondary | ICD-10-CM

## 2021-02-12 DIAGNOSIS — R279 Unspecified lack of coordination: Secondary | ICD-10-CM | POA: Diagnosis not present

## 2021-02-12 DIAGNOSIS — R400 Somnolence: Secondary | ICD-10-CM | POA: Diagnosis not present

## 2021-02-12 DIAGNOSIS — Z1211 Encounter for screening for malignant neoplasm of colon: Secondary | ICD-10-CM

## 2021-02-12 DIAGNOSIS — I2782 Chronic pulmonary embolism: Secondary | ICD-10-CM | POA: Diagnosis not present

## 2021-02-12 DIAGNOSIS — Z79890 Hormone replacement therapy (postmenopausal): Secondary | ICD-10-CM

## 2021-02-12 DIAGNOSIS — Z8249 Family history of ischemic heart disease and other diseases of the circulatory system: Secondary | ICD-10-CM | POA: Diagnosis not present

## 2021-02-12 DIAGNOSIS — Z86010 Personal history of colonic polyps: Secondary | ICD-10-CM

## 2021-02-12 DIAGNOSIS — G459 Transient cerebral ischemic attack, unspecified: Secondary | ICD-10-CM | POA: Diagnosis not present

## 2021-02-12 DIAGNOSIS — Z86711 Personal history of pulmonary embolism: Secondary | ICD-10-CM | POA: Diagnosis not present

## 2021-02-12 DIAGNOSIS — K5900 Constipation, unspecified: Secondary | ICD-10-CM | POA: Diagnosis not present

## 2021-02-12 HISTORY — DX: Cerebral infarction due to unspecified occlusion or stenosis of right middle cerebral artery: I63.511

## 2021-02-12 LAB — CBC
HCT: 37.1 % — ABNORMAL LOW (ref 39.0–52.0)
Hemoglobin: 13.1 g/dL (ref 13.0–17.0)
MCH: 28.9 pg (ref 26.0–34.0)
MCHC: 35.3 g/dL (ref 30.0–36.0)
MCV: 81.9 fL (ref 80.0–100.0)
Platelets: 240 10*3/uL (ref 150–400)
RBC: 4.53 MIL/uL (ref 4.22–5.81)
RDW: 13.1 % (ref 11.5–15.5)
WBC: 5.6 10*3/uL (ref 4.0–10.5)
nRBC: 0 % (ref 0.0–0.2)

## 2021-02-12 MED ORDER — LEVOTHYROXINE SODIUM 25 MCG PO TABS
25.0000 ug | ORAL_TABLET | Freq: Every day | ORAL | Status: DC
  Administered 2021-02-13 – 2021-03-12 (×28): 25 ug via ORAL
  Filled 2021-02-12 (×29): qty 1

## 2021-02-12 MED ORDER — BLOOD PRESSURE CONTROL BOOK
Freq: Once | Status: AC
  Filled 2021-02-12: qty 1

## 2021-02-12 MED ORDER — ACETAMINOPHEN 160 MG/5ML PO SOLN: 650.0000 mg | ORAL | 0 refills | Status: DC | PRN

## 2021-02-12 MED ORDER — ACETAMINOPHEN 325 MG PO TABS: 650.0000 mg | ORAL_TABLET | ORAL | Status: DC | PRN

## 2021-02-12 MED ORDER — LEVETIRACETAM 500 MG PO TABS
500.0000 mg | ORAL_TABLET | Freq: Two times a day (BID) | ORAL | Status: DC
  Administered 2021-02-12 – 2021-03-12 (×56): 500 mg via ORAL
  Filled 2021-02-12 (×56): qty 1

## 2021-02-12 MED ORDER — HYDRALAZINE HCL 25 MG PO TABS: 75.0000 mg | ORAL_TABLET | Freq: Three times a day (TID) | ORAL | Status: DC

## 2021-02-12 MED ORDER — LISINOPRIL 40 MG PO TABS: 40.0000 mg | ORAL_TABLET | Freq: Every day | ORAL | Status: DC

## 2021-02-12 MED ORDER — ASPIRIN EC 81 MG PO TBEC
81.0000 mg | DELAYED_RELEASE_TABLET | Freq: Every day | ORAL | Status: DC
  Administered 2021-02-13 – 2021-03-12 (×28): 81 mg via ORAL
  Filled 2021-02-12 (×28): qty 1

## 2021-02-12 MED ORDER — ASPIRIN 81 MG PO TBEC: 81.0000 mg | DELAYED_RELEASE_TABLET | Freq: Every day | ORAL | 11 refills | Status: AC

## 2021-02-12 MED ORDER — ACETAMINOPHEN 650 MG RE SUPP: 650.0000 mg | RECTAL | 0 refills | Status: DC | PRN

## 2021-02-12 MED ORDER — LISINOPRIL 20 MG PO TABS
40.0000 mg | ORAL_TABLET | Freq: Every day | ORAL | Status: DC
  Administered 2021-02-13 – 2021-02-23 (×11): 40 mg via ORAL
  Filled 2021-02-12 (×11): qty 2

## 2021-02-12 MED ORDER — AMLODIPINE BESYLATE 10 MG PO TABS: 10.0000 mg | ORAL_TABLET | Freq: Every day | ORAL | Status: DC

## 2021-02-12 MED ORDER — HYDRALAZINE HCL 50 MG PO TABS
75.0000 mg | ORAL_TABLET | Freq: Three times a day (TID) | ORAL | Status: DC
  Administered 2021-02-12 – 2021-02-26 (×41): 75 mg via ORAL
  Filled 2021-02-12 (×41): qty 1

## 2021-02-12 MED ORDER — SENNOSIDES-DOCUSATE SODIUM 8.6-50 MG PO TABS: 1.0000 | ORAL_TABLET | Freq: Every day | ORAL | Status: AC

## 2021-02-12 MED ORDER — SENNOSIDES-DOCUSATE SODIUM 8.6-50 MG PO TABS
1.0000 | ORAL_TABLET | Freq: Every day | ORAL | Status: DC
  Administered 2021-02-12 – 2021-03-11 (×28): 1 via ORAL
  Filled 2021-02-12 (×28): qty 1

## 2021-02-12 MED ORDER — ACETAMINOPHEN 650 MG RE SUPP: 650.0000 mg | RECTAL | Status: DC | PRN

## 2021-02-12 MED ORDER — ACETAMINOPHEN 325 MG PO TABS
650.0000 mg | ORAL_TABLET | ORAL | Status: DC | PRN
  Administered 2021-03-04: 650 mg via ORAL
  Filled 2021-02-12: qty 2

## 2021-02-12 MED ORDER — RESOURCE THICKENUP CLEAR PO POWD: 1.0000 | ORAL | Status: DC | PRN

## 2021-02-12 MED ORDER — EXERCISE FOR HEART AND HEALTH BOOK
Freq: Once | Status: AC
  Filled 2021-02-12: qty 1

## 2021-02-12 MED ORDER — ATORVASTATIN CALCIUM 40 MG PO TABS
40.0000 mg | ORAL_TABLET | Freq: Every day | ORAL | Status: DC
  Administered 2021-02-12 – 2021-03-12 (×29): 40 mg via ORAL
  Filled 2021-02-12 (×29): qty 1

## 2021-02-12 MED ORDER — DABIGATRAN ETEXILATE MESYLATE 150 MG PO CAPS
150.0000 mg | ORAL_CAPSULE | Freq: Two times a day (BID) | ORAL | Status: DC
  Administered 2021-02-12 – 2021-03-12 (×56): 150 mg via ORAL
  Filled 2021-02-12 (×56): qty 1

## 2021-02-12 MED ORDER — POTASSIUM CHLORIDE CRYS ER 20 MEQ PO TBCR: 40.0000 meq | EXTENDED_RELEASE_TABLET | Freq: Two times a day (BID) | ORAL | Status: DC

## 2021-02-12 MED ORDER — DABIGATRAN ETEXILATE MESYLATE 150 MG PO CAPS: 150.0000 mg | ORAL_CAPSULE | Freq: Two times a day (BID) | ORAL | Status: DC

## 2021-02-12 MED ORDER — LEVETIRACETAM 500 MG PO TABS: 500.0000 mg | ORAL_TABLET | Freq: Two times a day (BID) | ORAL | Status: DC

## 2021-02-12 MED ORDER — PANTOPRAZOLE SODIUM 40 MG PO TBEC
40.0000 mg | DELAYED_RELEASE_TABLET | Freq: Every day | ORAL | Status: DC
  Administered 2021-02-12 – 2021-03-11 (×28): 40 mg via ORAL
  Filled 2021-02-12 (×30): qty 1

## 2021-02-12 MED ORDER — DOCUSATE SODIUM 100 MG PO CAPS
100.0000 mg | ORAL_CAPSULE | Freq: Every day | ORAL | Status: DC
  Administered 2021-02-13 – 2021-03-12 (×28): 100 mg via ORAL
  Filled 2021-02-12 (×28): qty 1

## 2021-02-12 MED ORDER — ACETAMINOPHEN 160 MG/5ML PO SOLN: 650.0000 mg | ORAL | Status: DC | PRN

## 2021-02-12 MED ORDER — AMLODIPINE BESYLATE 10 MG PO TABS
10.0000 mg | ORAL_TABLET | Freq: Every day | ORAL | Status: DC
  Administered 2021-02-13 – 2021-03-12 (×28): 10 mg via ORAL
  Filled 2021-02-12 (×28): qty 1

## 2021-02-12 MED ORDER — PANTOPRAZOLE SODIUM 40 MG PO TBEC: 40.0000 mg | DELAYED_RELEASE_TABLET | Freq: Every day | ORAL | Status: DC

## 2021-02-12 MED ORDER — DOCUSATE SODIUM 100 MG PO CAPS: 100.0000 mg | ORAL_CAPSULE | Freq: Every day | ORAL | 0 refills | Status: DC
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
## 2021-02-12 NOTE — H&P (Signed)
Physical Medicine and Rehabilitation Admission H&P    Chief Complaint  Patient presents with  . Weakness  . Code Stroke  : HPI: Matthew Wise is a 60 year old right-handed male with history of pulmonary emboli maintained on Eliquis, hypertension, CKD stage III, hyperlipidemia, prediabetes, sickle cell disease, tobacco abuse as well as right MCA infarction recently admitted 01/21/2021 to 01/27/2021 and discharged home ambulating 150 feet rolling walker.  History taken from chart review and patient.  Patient lives alone.  1 level home 3 steps to entry.  Independent with assistive device.  He has a sister who checks on him regularly.  He presented on 02/03/2021 with left hemiparesis, subacute onset.  Crania CT scan unremarkable for acute intracranial process.  Suspect acute infarct of the right frontal parietal lobe posterior superior to chronic infarct with probable involvement of lateral precentral and postcentral gyri.  Patient did not receive TPA.  CT angiogram of head and neck showed abrupt occlusion of the right M1 segment new as compared to prior CTA of 01/25/2021.  Acute core infarct of the posterior right frontal parietal region largely corresponding with a completed infarct as seen on prior MRI.  EEG evidence of focal seizure arising from the right posterior quadrant during which patient was noted to have left upper extremity twitching.  Additionally there was cortical dysfunction in the right frontal temporal region likely due to underlying stroke.  Most recent echocardiogram with ejection fraction 55 to 60%.  Admission chemistries unremarkable except creatinine 1.83, urine drug screen negative, hemoglobin A1c 5.9.  Alcohol negative.  Patient underwent arteriogram with revascularization of right MCA M1 segment per interventional radiology.  Latest follow-up MRI showed large acute right MCA territory infarct with petechial hemorrhage and minimal leftward midline shift as well as small acute left  cerebellar infarct new small acute early subacute infarct in left occipital left parietal left frontal lobes.  Bilateral extremity Dopplers findings consistent with age-indeterminate DVT involving the right common femoral vein and right femoral vein as well as left femoral vein and left popliteal vein, left common femoral and SFA junction.  Patient was placed on intravenous heparin remained on low-dose aspirin and Pradaxa was added.  He had been loaded for Keppra for his suspected seizure.  Hospital course further complicated by dysphagia, started on Dysphagia #3 thin liquid diet.  Therapy evaluations completed patient was admitted due to left-sided hemiparesis for a comprehensive rehab program. Please see preadmission assessment from earlier today as well.   Review of Systems  Constitutional: Negative for chills and fever.  HENT: Negative for hearing loss.   Eyes: Negative for blurred vision and double vision.  Respiratory: Negative for cough and shortness of breath.   Cardiovascular: Negative for chest pain and palpitations.  Gastrointestinal: Negative for heartburn, nausea and vomiting.  Genitourinary: Negative for dysuria, flank pain and hematuria.  Musculoskeletal: Positive for joint pain and myalgias.  Skin: Negative for rash.  Neurological: Positive for focal weakness and weakness. Negative for sensory change.  All other systems reviewed and are negative.  Past Medical History:  Diagnosis Date  . Acute CVA (cerebrovascular accident) ([facility]) 09/09/2020  . Chronic anticoagulation 11/14/2014  . CKD (chronic kidney disease) stage 3, GFR 30-59 ml/min (HCC) 10/24/2019  . CKD (chronic kidney disease) stage 3, GFR 30-59 ml/min (HCC) 10/24/2019  . Clotting disorder ([place])   . 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 DVT (deep venous thrombosis) ([facility])  06/15/2011  . DVT of leg (deep venous thrombosis) (HCC)    rt.  leg  . Essential hypertension 11/28/2020  . H/O PE (pulmonary embolism) 06/15/2011  . Hyperlipidemia 11/28/2020  . 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 Prediabetes 10/24/2019  . Thyroid nodule 01/25/2021   Past Surgical History:  Procedure Laterality Date  . COLONOSCOPY WITH PROPOFOL N/A 10/29/2015   incomplete due to redundant colon. two simple tubular adenomas removed. patient was advised to go to [facility] for colonoscopy but he did not keep appt.  . IR CT HEAD LTD  02/02/2021  . IR PERCUTANEOUS ART THROMBECTOMY/INFUSION INTRACRANIAL INC DIAG ANGIO  02/02/2021  . RADIOLOGY WITH ANESTHESIA N/A 02/02/2021   Procedure: IR WITH ANESTHESIA;  Surgeon: Luanne Bras, MD;  Location: [facility];  Service: Radiology;  Laterality: N/A;  . TEE WITHOUT CARDIOVERSION N/A 09/10/2020   Procedure: TRANSESOPHAGEAL ECHOCARDIOGRAM (TEE) WITH PROPOFOL;  Surgeon: Satira Sark, MD;  Location: [facility];  Service: Cardiovascular;  Laterality: N/A;   Family History  Problem Relation Age of Onset  . COPD Father   . Stroke Brother   . Pulmonary embolism Sister   . Throat cancer Brother   . Arthritis Mother   . Diabetes Mother   . Stroke Mother   . Hypertension Brother   . Diabetes Brother   . Arthritis Brother   . Diabetes Sister   . Colon cancer Neg Hx    Social History:  reports that he has been smoking cigarettes. He started smoking about 37 years ago. He has a 30.00 pack-year smoking history. He has never used smokeless tobacco. He reports previous alcohol use of about 3.0 standard drinks of alcohol per week. He reports previous drug use. Allergies: No Known Allergies Medications Prior to Admission  Medication Sig Dispense Refill  . amLODipine (NORVASC) 5 MG tablet TAKE 1 TABLET (5 MG TOTAL) BY MOUTH DAILY. 90 tablet 0  . apixaban (ELIQUIS) 5 MG TABS tablet Take 1 tablet (5 mg total) by mouth 2 (two) times daily. 60 tablet 3  . atorvastatin (LIPITOR) 40 MG tablet TAKE 1 TABLET BY MOUTH EVERY  DAY (Patient taking differently: Take 40 mg by mouth daily.) 90 tablet 0  . fluticasone (FLONASE) 50 MCG/ACT nasal spray Place 1 spray into both nostrils daily as needed for allergies or rhinitis.    Marland Kitchen levothyroxine (SYNTHROID) 25 MCG tablet TAKE 1 TABLET BY MOUTH EVERY DAY (Patient taking differently: Take 25 mcg by mouth daily before breakfast.) 90 tablet 0  . Multiple Vitamins-Minerals (CENTRUM ADULTS PO) Take 1 tablet by mouth daily.      Drug Regimen Review Drug regimen was reviewed and remains appropriate with no significant issues identified  Home: Home Living Family/patient expects to be discharged to:: Private residence Living Arrangements: Alone Available Help at Discharge: Family,Available 24 hours/day Type of Home: House Home Access: Stairs to enter [employer] of Steps: 2-3 Entrance Stairs-Rails: Right Home Layout: One level Bathroom Shower/Tub: Chiropodist: Standard Bathroom Accessibility: Yes Home Equipment: Cane - quad,Shower seat  Lives With: Alone   Functional History: Prior Function Level of Independence: Independent with assistive device(s) Comments: Uses four prong cane, cooks for himself, uses scooter at grocery  Functional Status:  Mobility: Bed Mobility Overal bed mobility: Needs Assistance Bed Mobility: Rolling,Sidelying to Sit Rolling: Supervision Sidelying to sit: Mod assist Supine to sit: Min assist,HOB elevated,+2 for physical assistance General bed mobility comments: Pt rolls to L with R hand to L rail  with supervision. Pt attempted to move RLE with L but needed mod A to complete B LE's off bed and mod A at trunk to rise with use of rail Transfers Overall transfer level: Needs assistance Equipment used: Ambulation equipment used Transfer via Lift Equipment: Stedy Transfers: Sit to/from Stand Sit to Stand: Mod assist,+2 physical assistance Stand pivot transfers: Total assist,From elevated surface (with stedy)   Lateral/Scoot Transfers: Mod assist,+2 physical assistance General transfer comment: worked on sit<>stand multiple times within stedy, pt needing more assist after 3 reps due to fatigue. Performed in front of mirror working on maintaining midline during rising. Ambulation/Gait General Gait Details: pregait activity in standing including standing wt shifting    ADL: ADL Overall ADL's : Needs assistance/impaired Eating/Feeding: Minimal assistance,Sitting Eating/Feeding Details (indicate cue type and reason): Assistance for bilateral tasks Grooming: Wash/dry hands,Standing,Maximal assistance Grooming Details (indicate cue type and reason): Pt standing with Mod A +2. And then requiring Max A for standing balance with L knee blocked and support for L lateral lean. Max hand over hand for enagaging LUE to reach forward and turn on faucet and get soap. Pt performing bilateral coorindation to rub soap on L hand with RUE. Fatiguing and requiring to set for drying hands. Upper Body Bathing: Moderate assistance,Sitting Lower Body Bathing: Maximal assistance,Sit to/from stand Upper Body Dressing : Moderate assistance,Sitting Lower Body Dressing: Maximal assistance,Sit to/from stand Lower Body Dressing Details (indicate cue type and reason): Pt attempting to bend forward and pull up sock over heal but unable. Requiring Max A to don sock Toilet Transfer: Moderate assistance,+2 for physical assistance,+2 for safety/equipment,Transfer board (lateral scoot to drop arm recliner) Toilet Transfer Details (indicate cue type and reason): Mod A +2 to sequencing and faciltiating hips over to recliner Functional mobility during ADLs: Moderate assistance,Maximal assistance,+2 for physical assistance,+2 for safety/equipment General ADL Comments: Continues to present with decreased coordination, balance, and functional use of LUE.  Cognition: Cognition Overall Cognitive Status: Impaired/Different from  baseline Arousal/Alertness: Awake/alert Orientation Level: Oriented to person,Oriented to place,Oriented to time,Oriented to situation Attention: Focused,Sustained,Selective,Alternating Focused Attention: Appears intact Sustained Attention: Appears intact Selective Attention: Appears intact Selective Attention Impairment: Verbal basic Alternating Attention: Appears intact Memory: Impaired Memory Impairment: Decreased short term memory,Decreased recall of new information Decreased Short Term Memory: Verbal basic Awareness: Impaired Awareness Impairment: Emergent impairment Problem Solving: Impaired Problem Solving Impairment: Functional basic Executive Function: Self Monitoring,Self Correcting Self Monitoring: Impaired Self Monitoring Impairment: Verbal basic,Functional basic Safety/Judgment: Impaired Cognition Arousal/Alertness: Awake/alert Behavior During Therapy: Flat affect Overall Cognitive Status: Impaired/Different from baseline Area of Impairment: Problem solving,Safety/judgement,Following commands Following Commands: Follows one step commands consistently,Follows one step commands with increased time,Follows multi-step commands with increased time Safety/Judgement: Decreased awareness of safety,Decreased awareness of deficits Problem Solving: Slow processing,Decreased initiation,Requires tactile cues General Comments: unable to discern if pt's decreased verbalization during session is due to fatigue from the effort or depressed mood from experiencing his deficits more notably during therapy sessions  Physical Exam: Blood pressure (!) 149/76, pulse 65, temperature 98.4 F (36.9 C), temperature source Oral, resp. rate 18, height 6\' 1"  (1.854 m), weight 116.7 kg, SpO2 96 %. Physical Exam Vitals reviewed.  Constitutional:      General: He is not in acute distress.    Appearance: He is not ill-appearing.  HENT:     Head: Normocephalic and atraumatic.     Right Ear: External  ear normal.     Left Ear: External ear normal.     Nose: Nose normal.  Eyes:  General:        Right eye: No discharge.        Left eye: No discharge.     Extraocular Movements: Extraocular movements intact.  Cardiovascular:     Rate and Rhythm: Normal rate and regular rhythm.  Pulmonary:     Effort: Pulmonary effort is normal. No respiratory distress.     Breath sounds: No stridor.  Abdominal:     General: Abdomen is flat. Bowel sounds are normal. There is no distension.  Musculoskeletal:     Cervical back: Normal range of motion and neck supple.     Comments: LLE edema  Skin:    Comments: LLE with ulcer Vascular changes  Neurological:     Mental Status: He is alert.     Comments: Alert and oriented Makes eye contact with examiner.   Follows simple commands.   Fair awareness of deficits. Motor: RUE/RLE: 5/5 proximal to distal LUE: 0/5 LLE: HF, KE 3-/5, ADF 2/5 Sensation intact to light touch  Psychiatric:        Mood and Affect: Affect is blunt and flat.        Behavior: Behavior is slowed.     Results for orders placed or performed during the hospital encounter of 02/02/21 (from the past 48 hour(s))  CBC     Status: Abnormal   Collection Time: 02/11/21  2:40 AM  Result Value Ref Range   WBC 5.5 4.0 - 10.5 K/uL   RBC 4.51 4.22 - 5.81 MIL/uL   Hemoglobin 13.0 13.0 - 17.0 g/dL   HCT 36.7 (L) 39.0 - 52.0 %   MCV 81.4 80.0 - 100.0 fL   MCH 28.8 26.0 - 34.0 pg   MCHC 35.4 30.0 - 36.0 g/dL   RDW 13.2 11.5 - 15.5 %   Platelets 237 150 - 400 K/uL   nRBC 0.0 0.0 - 0.2 %    Comment: Performed at [facility], [place] [address], [place], [place] [address]  Basic metabolic panel     Status: Abnormal   Collection Time: 02/11/21  2:40 AM  Result Value Ref Range   Sodium 135 135 - 145 mmol/L   Potassium 3.1 (L) 3.5 - 5.1 mmol/L   Chloride 101 98 - 111 mmol/L   CO2 25 22 - 32 mmol/L   Glucose, Bld 94 70 - 99 mg/dL    Comment: Glucose reference range applies only to  samples taken after fasting for at least 8 hours.   BUN 9 6 - 20 mg/dL   Creatinine, Ser 1.31 (H) 0.61 - 1.24 mg/dL   Calcium 9.5 8.9 - 10.3 mg/dL   GFR, Estimated >60 >60 mL/min    Comment: (NOTE) Calculated using the CKD-EPI Creatinine Equation (2021)    Anion gap 9 5 - 15    Comment: Performed at [facility] [address] [place], [place] [address]  CBC     Status: Abnormal   Collection Time: 02/12/21  3:56 AM  Result Value Ref Range   WBC 5.6 4.0 - 10.5 K/uL   RBC 4.53 4.22 - 5.81 MIL/uL   Hemoglobin 13.1 13.0 - 17.0 g/dL   HCT 37.1 (L) 39.0 - 52.0 %   MCV 81.9 80.0 - 100.0 fL   MCH 28.9 26.0 - 34.0 pg   MCHC 35.3 30.0 - 36.0 g/dL   RDW 13.1 11.5 - 15.5 %   Platelets 240 150 - 400 K/uL   nRBC 0.0 0.0 - 0.2 %  Comment: Performed at [facility], [place] [address] [place], [place] [address]   No results found.     Medical Problem List and Plan: 1.  Left-sided hemiparesis secondary to right MCA infarct likely atheroembolic status post thrombectomy as well as posterior frontoparietal region infarct   -patient may shower  -ELOS/Goals: 14-16 days/Supervision/Min A  Admit to CIR 2.  Antithrombotics: -DVT/anticoagulation: Bilateral lower extremity DVT/PE in the setting of hypercoagulopathy disorder.  Continue Pradaxa.  -antiplatelet therapy: Aspirin 81 mg daily 3. Pain Management: Tylenol as needed 4. Mood: Provide emotional support  -antipsychotic agents: N/A 5. Neuropsych: This patient is capable of making decisions on his own behalf. 6. Skin/Wound Care: Routine skin checks 7. Fluids/Electrolytes/Nutrition: Routine in and outs  CMP ordered 8.  Seizure disorder.  Keppra 500 mg twice daily 9.  Hypertension.  Lisinopril 40 mg daily, hydralazine 75 mg every 8 hours, Norvasc 10 mg daily.    Monitor with increased mobility 10.  Hypothyroidism.  Synthroid 11.  CKD stage III.  Baseline creatinine 1.83.    CMP ordered for tomorrow 12.  History of tobacco abuse.   Provide counseling 13.  Hyperlipidemia: Lipitor  Cathlyn Parsons, PA-C 02/12/2021  I have personally performed a face to face diagnostic evaluation, including, but not limited to relevant history and physical exam findings, of this patient and developed relevant assessment and plan.  Additionally, I have reviewed and concur with the physician assistant's documentation above.  Delice Lesch, MD, ABPMR  The patient's status has not changed. Any changes from the pre-admission screening or documentation from the acute chart are noted above.   Delice Lesch, MD, ABPMR

## 2021-02-12 NOTE — TOC Transition Note (Signed)
Transition of Care [facility]) - CM/SW Discharge Note   Patient Details  Name: Matthew Wise MRN: [id number] Date of Birth: March 20, 1961  [facility]) CM/SW Contact:  Pollie Friar, RN Phone Number: 02/12/2021, 1:20 PM   Clinical Narrative:    Pt discharging to CIR today. CM signing off.   Final next level of care: IP Rehab Facility Barriers to Discharge: No Barriers Identified   Patient Goals and CMS Choice     Choice offered to / list presented to : Patient  Discharge Placement                       Discharge Plan and Services   Discharge Planning Services: CM Consult                                 Social Determinants of Health (SDOH) Interventions     Readmission Risk Interventions No flowsheet data found.

## 2021-02-12 NOTE — Progress Notes (Signed)
Patient arrive on the unit in bed. A&O x4 at the time. Denied pain or discomfort. Assessment done vitals stable. Patient educated on POC and safety plan. Patient educated not to put phone on the bed. We continue to monitor.

## 2021-02-12 NOTE — Consult Note (Signed)
   [facility] CM Inpatient Consult   02/12/2021  Matthew Wise 1961-01-22 [id number]   [facility] [ACO] Patient: [name]   Patient screened for Stroke EMMI red alert referral today after an EMMI call was reached that patient felt overall 'worst'  Also noted for hospitalization with less than 7 days unplanned readmission noted.  Review of alert to assess for potential [name] service needs for post hospital transition.  Review of patient's medical record reveals patient is currently being recommended for an inpatient rehabilitation for transition of care.   Plan:  Patient's EMMI calls were deactivated as patient is in the hospital and receiving medical care. Will follow for disposition and note to the rehab team if patient returns to community he is in the [name] plan.  For questions contact:   Natividad Brood, RN BSN [facility] Liaison  [phone] business mobile phone Toll free office [phone]  Fax number: [phone] [name]

## 2021-02-12 NOTE — Discharge Summary (Signed)
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 demonstrated no evidence of vasospasm, intraluminal filling defects or of dissections. No change was seen in the intracranial circulation. Over a 0.014 inch Aristotle micro guidewire the combination of an 021 microcatheter inside of an 071 136 cm Zoom aspiration catheter was advanced without difficulty to the supraclinoid right ICA. The micro guidewire was then gently manipulated with a torque device and advanced without difficulty into the M2 M3 region of the superior division of the right middle cerebral artery followed by the microcatheter. The guidewire was removed. Good aspiration was obtained from the hub of the microcatheter. A gentle control arteriogram performed through the microcatheter demonstrated safe position of the tip of the microcatheter. This was then connected to continuous heparinized saline infusion. A 3 mm x 40 mm Solitaire X retrieval device was then advanced to the distal end of the microcatheter. The O ring on the delivery microcatheter was loosened. With slight forward gentle traction with the right hand on the delivery micro guidewire, with the left the delivery microcatheter was retrieved deploying the  retrieval device. The aspiration catheter was then engaged at the origin of the superior division branch. With proximal flow arrest in the right internal carotid artery, and constant aspiration applied at the hub of the Zoom aspiration catheter, and with a 20 mL syringe at the hub of the balloon guide catheter, over 2 minutes, the combination of the retrieval device, the microcatheter and the aspiration catheter were retrieved and removed. Following reversal of flow arrest, a control arteriogram performed through the balloon guide in the right internal carotid artery demonstrated significantly improved opacification of the right MCA distribution. Moderate spasm of the right M1 segment resolved with 2 aliquots of 25 mcg of nitroglycerin. A control arteriogram performed after this demonstrated a TICI 2C revascularization of the right MCA distribution. A distal M2 branch of the inferior division demonstrates severe tapered stenosis with occlusion. This did not respond to additional dose of 25 mcg of nitroglycerin. A combination of the 021 microcatheter inside of a 55 136 cm Zoom aspiration catheter was then advanced over a 0.014 inch micro guidewire with a J configuration in the manner described above. The micro guidewire was gently advanced through this occluded branch into the M3 region followed by the microcatheter. The guidewire was removed. Slow aspiration was noted at the hub of the microcatheter. A gentle control arteriogram performed through microcatheter however, demonstrated patency distal to the tip. Microcatheter was connected to continuous heparinized saline infusion. A 3 mm x 40 mm Solitaire X retrieval device was advanced to the distal end of the microcatheter and deployed in the manner described as above. The 55 aspiration catheter was advanced to  just proximal to the occlusion. With proximal flow arrest in the right internal carotid artery, and constant aspiration applied the hub of the 55 aspiration  Zoom catheter over 2-1/2 minutes, the combination of the retrieval device, the microcatheter and the aspiration catheter were retrieved and removed. A few specks of grayish white thrombi were seen entangled in the retrieval device. A control arteriogram performed through the balloon guide catheter following reversal of flow arrest demonstrated marginally improved caliber and flow in the treated vessel with continued occlusion distally. Another aliquots of 25 mcg of nitroglycerin was then infused intra-arterially without resolution of the occlusion in the distal M2 segment of this branch. 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 Subsequent revascularization of this management performed due to the distal nature of the occlusion due to probably intracranial arteriosclerosis, probably responsible for the ischemic event in February. A final control arteriogram performed through the balloon guide catheter in the right internal carotid artery continued to demonstrate a TICI 2C revascularization of the right MCA distribution with significant support also from the left middle collaterals from the anterior cerebral artery, and the P3 branches of the right posterior cerebral artery. More proximal arteriogram performed in the right common carotid artery demonstrated wide patency of the right internal carotid artery with no change in the intracranial circulation. The balloon guide was removed. The 8 French Pinnacle sheath was removed with the successful application of an 8 French Angio-Seal closure device for hemostasis at the right groin puncture site. Distal pulses remained Dopplerable in both feet unchanged. CT of the brain demonstrated no gross intracranial hemorrhages. However, noted was early edematous changes involving the cortical areas of the posterior temporal and the parietal cerebral convexities probably representing  luxury perfusion. Also noted was a small focal area of contrast stain in the posterior aspect of the right putamen. No mass-effect or midline shift was noted. The patient's general anesthesia was then reversed, and the patient was extubated successfully. Upon slow recovery, the patient was able to close his eyes to command and also move his right arm and leg. No movement was seen in the left arm and left leg. He was then transferred to the neuro ICU for post revascularization management. IMPRESSION: Status post endovascular revascularization of occluded right middle cerebral artery distribution achieving a TICI 2C revascularization with 2 passes with a 3 mm x 40 mm Solitaire X retrieval device and contact aspiration. PLAN: As per referring MD. Electronically Signed   By: Luanne Bras M.D.   On: 02/03/2021 10:11   CT CEREBRAL PERFUSION W CONTRAST  Result Date: 02/02/2021 CLINICAL DATA:  Initial evaluation for acute stroke. EXAM: CT ANGIOGRAPHY HEAD AND NECK CT PERFUSION BRAIN TECHNIQUE: Multidetector CT imaging of the head and neck was performed using the standard protocol during bolus administration of intravenous contrast. Multiplanar CT image reconstructions and MIPs were obtained to evaluate the vascular anatomy. Carotid stenosis measurements (when applicable) are obtained utilizing NASCET criteria, using the distal internal carotid diameter as the denominator. Multiphase CT imaging of the brain was performed following IV bolus contrast injection. Subsequent parametric perfusion maps were calculated using RAPID software. CONTRAST:  174mL OMNIPAQUE IOHEXOL 350 MG/ML SOLN COMPARISON:  Prior CT from earlier the same day as well as recent MRI from 01/26/2021 and CTA from 01/25/2021. FINDINGS: CTA NECK FINDINGS Aortic arch: Visualized aortic arch normal in caliber with normal 3 vessel morphology. No hemodynamically significant stenosis about the origin of the  great vessels. Right carotid system: Right common  and internal carotid arteries widely patent without stenosis, dissection or occlusion. Mild atheromatous change about the origin of the right ICA without significant stenosis. Left carotid system: Left common and internal carotid arteries widely patent without stenosis, dissection or occlusion. Mild atheromatous change about the proximal left ICA without significant stenosis. Vertebral arteries: Vertebral arteries are diminutive and poorly visualized within the neck, but are grossly patent without stenosis or other abnormality. Skeleton: No visible acute osseous finding. No discrete or worrisome osseous lesions. Other neck: No other acute soft tissue abnormality within the neck. 1.7 cm sebaceous cyst noted at the right submandibular region. Bilateral thyroid nodules noted, recently evaluated on dedicated thyroid ultrasound. No other mass or adenopathy. Upper chest: Visualized upper chest demonstrates no acute finding. Engorgement of the azygos vein noted. Review of the MIP images confirms the above findings CTA HEAD FINDINGS Anterior circulation: Evaluation technically limited by timing the contrast bolus with poor opacification of the intracranial arterial circulation. Both internal carotid arteries remain patent to the termini without stenosis. A1 segments patent. Grossly normal anterior communicating artery complex. Anterior cerebral arteries remain grossly patent to their distal aspects. Left M1 patent. Grossly normal left MCA bifurcation. No visible proximal left M2 branch occlusion. Distal left MCA branches perfused. There now appears to have developed in occlusion of the right M1 segment (series 4, image 119). There was suggestion of an underlying stenosis at this level on prior CTA. Minimal scant flow seen distally, which could be collateral in nature. An adjacent right temporal branch appears to remain patent. Posterior circulation: Both V4 segments are grossly patent to the vertebrobasilar junction without  appreciable stenosis. Left PICA grossly patent. Right PICA not seen. Basilar markedly diminutive but grossly patent to its distal aspect. Superior cerebellar arteries appear patent proximally. Fetal type origin of the PCAs supplied via a robust bilateral posterior communicating arteries. Both PCAs remain grossly perfused to their distal aspects. Venous sinuses: Patent. Anatomic variants: Fetal type origin of the PCAs. No visible aneurysm. Review of the MIP images confirms the above findings CT Brain Perfusion Findings: ASPECTS: Estimated at approximately 6 on prior CT. CBF (<30%) Volume: 32mL Perfusion (Tmax>6.0s) volume: 131mL Mismatch Volume: 80mL Infarction Location:Evidence for acute core infarct at the posterior right frontoparietal region, largely corresponding with the completed infarct as seen on prior MRI. This may have expanded partially in the interim. Evidence for associated subtle leptomeningeal enhancement on corresponding CTA, consistent with the subacute nature of this infarct. Surrounding ischemic penumbra of 57 cc, consider tissue at risk. IMPRESSION: 1. Abrupt occlusion of the right M1 segment, new as compared to prior CTA from 01/25/2021. There is suggestion of an underlying stenosis at this level on prior CTA from 01/25/2021. 2. Acute core infarct at the posterior right frontoparietal region, largely corresponding with the completed infarct as seen on prior MRI. This may have partially expanded in the interim. Surrounding ischemic penumbra of 57 cc, considered tissue at risk. 3. Fetal type origin of the PCAs with overall diminutive vertebrobasilar system. 4. Otherwise gross patency of the major arterial vasculature of the head and neck, although evaluation somewhat technically limited by timing of the contrast bolus. Critical Value/emergent results were discussed by telephone at the time of interpretation on 02/02/2021 at 8:36 p.m. to provider [facility] , who verbally acknowledged these  results. Electronically Signed   By: Jeannine Boga M.D.   On: 02/02/2021 21:35   CT Code Stroke Cerebral Perfusion with contrast  Result Date: 01/25/2021 CLINICAL DATA:  Acute right brain stroke. EXAM: CT ANGIOGRAPHY HEAD AND NECK CT PERFUSION BRAIN TECHNIQUE: Multidetector CT imaging of the head and neck was performed using the standard protocol during bolus administration of intravenous contrast. Multiplanar CT image reconstructions and MIPs were obtained to evaluate the vascular anatomy. Carotid stenosis measurements (when applicable) are obtained utilizing NASCET criteria, using the distal internal carotid diameter as the denominator. Multiphase CT imaging of the brain was performed following IV bolus contrast injection. Subsequent parametric perfusion maps were calculated using RAPID software. CONTRAST:  139mL OMNIPAQUE IOHEXOL 350 MG/ML SOLN COMPARISON:  Head CT earlier same day. FINDINGS: CTA NECK FINDINGS Aortic arch: Aortic atherosclerotic calcification. Branching pattern is normal without origin stenosis. Right carotid system: Common carotid artery widely patent to the bifurcation. Soft and calcified plaque at the carotid bifurcation and ICA bulb, but without stenosis or visible ulceration. Left carotid system: Common carotid artery widely patent to the bifurcation. Soft and calcified plaque at the carotid bifurcation. No ICA stenosis. Vertebral arteries: Vertebral arteries are poorly seen due to low level contrast opacification in the fact that these are very small vessels. There does appear to be antegrade flow in both vertebral arteries through the cervical region. Skeleton: Minimal cervical spondylosis. Other neck: No adenopathy. Sore nodule on the left with cystic area and calcification measuring 16 mm in diameter. Solid-appearing nodule the right lobe of the thyroid measuring 12 mm in diameter. Upper chest: Massive enlargement of the azygos vein as seen previously. Lung apices show  emphysema pulmonary scarring. Review of the MIP images confirms the above findings CTA HEAD FINDINGS Anterior circulation: Both internal carotid arteries are patent through the skull base and siphon regions. There is some atherosclerotic calcification in the carotid siphon regions. Detail is limited due to low contrast opacification. Both posterior cerebral arteries take fetal origin from the anterior circulation. The anterior and middle cerebral artery on the left are patent. No proximal stenosis is seen. I do not see a distal vessel occlusion, but opacification is poor. On the right, the anterior cerebral artery shows flow. Middle cerebral artery is patent proximally. Distal vessel opacification is poor. I cannot identify an occluded branch vessel presently. Posterior circulation: Small vertebral arteries are patent through the foramen magnum to the basilar. The basilar artery shows flow. Detail is very limited due to poor opacification. Venous sinuses: Patent and normal. Anatomic variants: None otherwise Review of the MIP images confirms the above findings CT Brain Perfusion Findings: ASPECTS: 6 CBF (<30%) Volume: 74mL Perfusion (Tmax>6.0s) volume: 59mL. This includes a lot tissue in the region of the old completed infarction in the frontal operculum. Looking at T-max greater than 8 which probably more accurately reflects the region of acute insult, that volume is about 10 mL. Mismatch Volume: See above discussion. Actual mismatch is probably fairly small, almost unmeasurable. Infarction Location:Right inferior parietal region. IMPRESSION: 1. Very limited CTA study due to low level contrast opacification. 2. Atherosclerotic disease at both carotid bifurcations but without stenosis or visible ulceration. 3. Limited evaluation of the intracranial vessels due to poor opacification. No large or medium vessel occlusion is identified. opacification is quite limited. 4. Perfusion study shows a 11 mL region of completed  infarction in the right inferior parietal region. T-max greater than 6 is 29 cc, but this includes a lot tissue in the region of the old completed infarction in the frontal operculum. Actual mismatch in the region of the acute insult is probably fairly small, almost unmeasurable.  In summary, I think this is a completed infarction in the inferior division right MCA territory with little if any salvageable brain tissue. 5. 16 mm left thyroid nodule with cystic area and calcification. Solid-appearing nodule the right lobe of the thyroid measuring 12 mm in diameter. Recommend thyroid US (ref: J Am Coll Radiol. 2015 Feb;12(2): 143-50). 6. Emphysema and aortic atherosclerosis. Aortic Atherosclerosis (ICD10-I70.0) and Emphysema (ICD10-J43.9). Electronically Signed   By: Nelson Chimes M.D.   On: 01/25/2021 19:57   EEG adult  Result Date: 02/04/2021 Lora Havens, MD     02/05/2021 10:02 AM Patient Name: GRAFTON WARZECHA MRN: [id number] Epilepsy Attending: Lora Havens Referring Physician/Provider: Dr Kerney Elbe Date: 02/04/2021 Duration: 25.15 mins Patient history: 60 year old male with right upper extremity twitching.  EEG telemetry for seizures. Level of alertness: Awake AEDs during EEG study: None Technical aspects: This EEG study was done with scalp electrodes positioned according to the 10-20 International system of electrode placement. Electrical activity was acquired at a sampling rate of 500Hz  and reviewed with a high frequency filter of 70Hz  and a low frequency filter of 1Hz . EEG data were recorded continuously and digitally stored. Description: The posterior dominant rhythm consists of 9 Hz activity of moderate voltage (25-35 uV) seen predominantly in posterior head regions, symmetric and reactive to eye opening and eye closing. Hyperventilation and photic stimulation were not performed.   Patient was noted to have left upper extremity twitching intermittently throughout the study.  Concomitant EEG before,  during and after the event did not show any EEG changes. Of note, EEG was technically difficult due to significant myogenic artifact and high impedance. IMPRESSION: This technically difficult study is within normal limits. Multiple episodes of left upper extremity twitching were noted without concomitant change.  However focal motor seizures may not be seen on scalp EEG.  Therefore clinical correlation recommended.  Priyanka Barbra Sarks   Overnight EEG with video  Result Date: 02/05/2021 Lora Havens, MD     02/06/2021  1:29 PM Patient Name: JENESIS SUCHY MRN: [id number] Epilepsy Attending: Lora Havens Referring Physician/Provider: Dr Kerney Elbe Duration: 02/04/2021 2251 to 02/05/2021 2251  Patient history: 60 year old male with right upper extremity twitching.  EEG to evaluate for seizures.  Level of alertness: Awake, asleep  AEDs during EEG study: None  Technical aspects: This EEG study was done with scalp electrodes positioned according to the 10-20 International system of electrode placement. Electrical activity was acquired at a sampling rate of 500Hz  and reviewed with a high frequency filter of 70Hz  and a low frequency filter of 1Hz . EEG data were recorded continuously and digitally stored. Description: The posterior dominant rhythm consists of 9 Hz activity of moderate voltage (25-35 uV) seen predominantly in posterior head regions, symmetric and reactive to eye opening and eye closing. Sleep was characterized by vertex waves, sleep spindles (12-14hz ), maximal frontocentral region. EEG showed continuous 3-5hz  theta-delta slowing in right frontotemporal region. Event button was pressed on 02/05/2021 at 0213 for left face and arm twitching. Concomitant EEG showed sharp waves in right posterior quadrant which gradually evolved in frequency and spread to involve left posterior quadrant. Two more seizures were noted without definite clinical signs arising from right posterior quadrant on 02/04/2021 2343, on  02/05/2021 0021. ABNORMALITY - Focal seizure, right posterior quadrant - Continuous slow, right frontotemporal region IMPRESSION: This study showed evidence of focal seizure arising from right posterior quadrant during which patient was noted to have left upper extremity twitching as described  above. Additionally there was cortical dysfunction in right frontotemporal region likely due to underlying stroke.  Lora Havens   ECHOCARDIOGRAM COMPLETE  Result Date: 01/22/2021    ECHOCARDIOGRAM REPORT   Patient Name:   VESTAL MARKIN Date of Exam: 01/22/2021 Medical Rec #:  [id number]       Height:       72.0 in Accession #:    [id number]      Weight:       264.3 lb Date of Birth:  1961-07-29      BSA:          2.399 m Patient Age:    41 years        BP:           140/75 mmHg Patient Gender: M               HR:           58 bpm. Exam Location:  [facility] Procedure: 2D Echo Indications:    Syncope R55  History:        Patient has prior history of Echocardiogram examinations, most                 recent 11/28/2020. Stroke, Signs/Symptoms:Syncope; Risk                 Factors:Current Smoker and Dyslipidemia. Elevated Troponin,                 Pulmonary Embolism, Sickle Cell trait.  Sonographer:    Leavy Cella RDCS (AE) Referring Phys: [id number] [place]  1. Left ventricular ejection fraction, by estimation, is 55 to 60%. The left ventricle has normal function. The left ventricle demonstrates regional wall motion abnormalities (see scoring diagram/findings for description). There is mild left ventricular  hypertrophy. Left ventricular diastolic parameters are indeterminate.  2. Right ventricular systolic function is normal. The right ventricular size is normal.  3. The mitral valve is grossly normal. Trivial mitral valve regurgitation.  4. The aortic valve is tricuspid. Aortic valve regurgitation is not visualized.  5. The inferior vena cava is normal in size with greater than 50% respiratory  variability, suggesting right atrial pressure of 3 mmHg. FINDINGS  Left Ventricle: Left ventricular ejection fraction, by estimation, is 55 to 60%. The left ventricle has normal function. The left ventricle demonstrates regional wall motion abnormalities. The left ventricular internal cavity size was normal in size. There is mild left ventricular hypertrophy. Left ventricular diastolic parameters are indeterminate.  LV Wall Scoring: The apical lateral segment and apical inferior segment are hypokinetic. The entire anterior wall, antero-lateral wall, entire septum, inferior wall, posterior wall, and apex are normal. Right Ventricle: The right ventricular size is normal. No increase in right ventricular wall thickness. Right ventricular systolic function is normal. Left Atrium: Left atrial size was normal in size. Right Atrium: Right atrial size was normal in size. Pericardium: There is no evidence of pericardial effusion. Mitral Valve: The mitral valve is grossly normal. Mild mitral annular calcification. Trivial mitral valve regurgitation. Tricuspid Valve: The tricuspid valve is grossly normal. Tricuspid valve regurgitation is trivial. Aortic Valve: The aortic valve is tricuspid. There is mild aortic valve annular calcification. Aortic valve regurgitation is not visualized. Pulmonic Valve: The pulmonic valve was not well visualized. Pulmonic valve regurgitation is not visualized. Aorta: The aortic root is normal in size and structure. Venous: The inferior vena cava is normal in size with greater than 50% respiratory variability, suggesting right  atrial pressure of 3 mmHg. IAS/Shunts: No atrial level shunt detected by color flow Doppler. Rozann Lesches MD Electronically signed by Rozann Lesches MD Signature Date/Time: 01/22/2021/11:54:35 AM    Final    US THYROID  Result Date: 01/26/2021 CLINICAL DATA:  Palpable abnormality.  Palpable thyroid nodule. EXAM: THYROID ULTRASOUND TECHNIQUE: Ultrasound examination of  the thyroid gland and adjacent soft tissues was performed. COMPARISON:  None. FINDINGS: Parenchymal Echotexture: Normal Isthmus: Normal in size measuring 0.4 cm in diameter Right lobe: Normal in size measuring 5.3 x 2.3 x 1.9 cm Left lobe: Normal in size measuring 4.6 x 2.4 x 1.9 cm _________________________________________________________ Estimated total number of nodules >/= 1 cm: 2 Number of spongiform nodules >/=  2 cm not described below (TR1): 0 Number of mixed cystic and solid nodules >/= 1.5 cm not described below (TR2): 0 _________________________________________________________ There is an approximately 1.2 x 1.0 x 0.9 cm minimally complex cyst within the mid aspect of the right lobe of the thyroid (labeled 1), which does not meet criteria to recommend percutaneous sampling or continued dedicated follow-up. _________________________________________________________ Nodule # 2: Location: Left; Mid Maximum size: 1.3 cm; Other 2 dimensions: 1.2 x 0.7 cm Composition: solid/almost completely solid (2) Echogenicity: isoechoic (1) Shape: not taller-than-wide (0) Margins: smooth (0) Echogenic foci: macrocalcifications (1) ACR TI-RADS total points: 4. ACR TI-RADS risk category: TR4 (4-6 points). ACR TI-RADS recommendations: *Given size (>/= 1 - 1.4 cm) and appearance, a follow-up ultrasound in 1 year should be considered based on TI-RADS criteria. _________________________________________________________ There is an approximately 0.9 x 0.7 x 0.6 cm minimally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 of the right ICA without significant stenosis. Left carotid system: Left common and internal carotid arteries widely patent without stenosis, dissection or occlusion. Mild atheromatous change about the proximal left ICA without significant stenosis. Vertebral arteries: Vertebral arteries are diminutive and poorly visualized within the neck, but are grossly patent without stenosis or other abnormality. Skeleton: No visible acute osseous finding. No discrete or worrisome osseous lesions. Other neck: No other acute soft tissue abnormality within the neck. 1.7 cm sebaceous cyst noted at the right submandibular region. Bilateral thyroid nodules noted, recently evaluated on dedicated thyroid ultrasound. No other mass or adenopathy. Upper chest: Visualized upper chest demonstrates no acute finding. Engorgement of the azygos vein noted. Review of the MIP images confirms the above findings CTA HEAD FINDINGS Anterior circulation: Evaluation technically limited by timing the contrast bolus with poor opacification of the intracranial arterial circulation. Both internal carotid arteries remain patent to the termini without stenosis. A1 segments patent. Grossly normal anterior communicating artery complex. Anterior cerebral arteries remain grossly patent to their distal aspects. Left M1 patent. Grossly normal left MCA bifurcation. No visible proximal left M2 branch occlusion. Distal left MCA branches perfused. There now appears to have developed in occlusion of the right M1 segment (series 4, image 119). There was suggestion of an underlying stenosis at this  level on prior CTA. Minimal scant flow seen distally, which could be collateral in nature. An adjacent right temporal branch appears to remain patent. Posterior circulation: Both V4 segments are grossly patent to the vertebrobasilar junction without appreciable stenosis. Left PICA grossly patent. Right PICA not seen. Basilar markedly diminutive but grossly patent to its distal aspect. Superior cerebellar arteries appear patent proximally. Fetal type origin of the PCAs supplied via a robust bilateral posterior communicating arteries. Both PCAs remain grossly perfused to their distal aspects. Venous sinuses: Patent. Anatomic variants: Fetal type origin of the PCAs. No visible aneurysm. Review of the MIP images confirms the above findings CT Brain Perfusion Findings: ASPECTS: Estimated at approximately 6 on prior CT. CBF (<30%) Volume: 43mL Perfusion (Tmax>6.0s) volume: 11mL Mismatch Volume: 49mL Infarction Location:Evidence for acute core infarct at the posterior right frontoparietal region, largely corresponding with the completed infarct as seen on prior MRI. This may have expanded partially in the interim. Evidence for associated subtle leptomeningeal enhancement on corresponding CTA, consistent with the subacute nature of this infarct. Surrounding ischemic penumbra of 57 cc, consider tissue at risk. IMPRESSION: 1. Abrupt occlusion of the right M1 segment, new as compared to prior CTA from 01/25/2021. There is suggestion of an underlying stenosis at this level on prior CTA from 01/25/2021. 2. Acute core infarct at the posterior right frontoparietal region, largely corresponding with the completed infarct as seen on prior MRI. This may have partially  expanded in the interim. Surrounding ischemic penumbra of 57 cc, considered tissue at risk. 3. Fetal type origin of the PCAs with overall diminutive vertebrobasilar system. 4. Otherwise gross patency of the major arterial vasculature of the head and neck, although  evaluation somewhat technically limited by timing of the contrast bolus. Critical Value/emergent results were discussed by telephone at the time of interpretation on 02/02/2021 at 8:36 p.m. to provider [facility] , who verbally acknowledged these results. Electronically Signed   By: Jeannine Boga M.D.   On: 02/02/2021 21:35       HISTORY OF PRESENT ILLNESS  Mr. EDRICK WHITEHORN is a 60 y.o. male with history of hypertension, hyperlipidemia, DVT/PE on Eliquis, sickle cell disease presenting with sudden onset of left arm and leg weakness along with left facial droop with a last known well of 1500 hrs. on 02/02/2021.  Seen by telemedicine neurology and transported to [facility] for higher level of care due to right MCA M1 filling deficit and perfusion deficit on perfusion imaging-see details below.  Patient was seen in December/January as well as again in February for right MCA strokes-first 1 in the right M1 superior division and the second 1 involving patchy distribution of both superior and inferior division of the right MCAs. Positive TCD Bubble study with delayed HITS noted after valasava indicative of a small intrapulmonary shunt likely with further investigation of CT chest showing no PE, AVM or fistula.    This time around, CTA showed abrupt occlusion of the right M1 segment which is new as compared to the prior CTA from February 2022 suggesting underlying stenosis on the prior CTA that probably now occluded. There was an acute core infarct in the posterior right frontoparietal region likely corresponding with the completed infarct as seen on prior MRI but there was presumably penumbra of 57 cc considered tissue at risk for which he was taken for emergent IR thrombectomy.  HOSPITAL COURSE  MRI - large acute RMCA infarct with peticheal hemorrhage, minimal leftward shift, small acute left cerebellar infarct, new small acute/subacute left occipital, left parietal and left frontal  lobe infarcts. Could be periprocedural but very suspicious for a cardioembolic etiology.  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  Suspect acute infarction of the right frontoparietal lobes posterior superior to chronic infarct with probable involvement of lateral precentral and postcentral gyri.  CTA head & neck-abrupt right M1 segment occlusion new as compared to CTA from 01/25/2021.  This is suggestive of an underlying stenosis at this level on prior CTA from 01/25/2021.  Acute cord infarct at the posterior right frontoparietal region likely corresponding with the completed infarct as seen on prior MRI.  This may have partially expanded in the interim.  Surrounding ischemic penumbra 57 cc-consider tissue address.  Fetal PCA bilaterally and overall diminutive vertebrobasilar system.  Technically limited by contrast bolus  CT perfusion -57 cc penumbra-in hindsight likely overestimating.  CT head after the procedure -progressive nonhemorrhagic cortical infarct in the posterior right MCA territory with further loss of gray-white differentiation.  Progressive edema and mass-effect with 2 to 3 mm midline shift.  Hyperdense areas in the sulci likely reflective of pseudosubarachnoid hemorrhage with areas of cortex adjacent to 1 another.  No definitive hemorrhage is present.  Stable remote infarct of the anterior right MCA territory.  MRI shows large acute RMCA infarct with peticheal hemorrhage, minimal leftward shift, small acute left cerebellar infarct, new  small acute/subacute left occipital, left parietal and left frontal lobe infarcts.  Could be periprocedural but very suspicious for a cardioembolic etiology-also due to increased LA size seen on TEE.  2D Echo completed on 01/22/2021 with LVEF 55 to 60%, left ventricle with regional wall motion abnormalities.  Left ventricular hypertrophy-mild.  Diastolic parameters of the left  ventricle indeterminate.  Right ventricular systolic function normal.  Mitral valve normal.  Aortic valve is tricuspid and normal.  Left atrium normal.  LDL 25  HgbA1c 5.9  VTE prophylaxis -SCDs for now.  May start chemical prophylaxis after MRI is completed and shows no large bleed.         Diet     DIET DYS 3 Room service appropriate? Yes; Fluid consistency: Thin         Eliquis (apixaban) daily prior to admission, Started on Pradaxa 150mg  bid on 02/09/2021 and continue Aspirin 81mg  daily  Therapy recommendations: CIR  Disposition: CIR Pending insurance approval  Cerebral Edema- in the setting of large R MCA stroke, with subtle worsening on repeat  imaging 3/8 with increased midline shift. - Repeat CT head 3/8 shows increased size of large right MCA territory infarct with unchanged subarachnoid hemorrhage.There is increased mass effect on the right lateral ventricle and 3 mm of left midline shift.A smallleft cerebellar infarctis also noted.  - Weaned off HTS On 3/11  Seizure-like activity noted clinically and confirmed on EEG. - Left upper extremity shaking - EEG concerning for multiple seizures.   - Loaded with Keppra 3 g followed by Keppra 500 twice daily on 3/9 - Good control of seizures as noted on  EEG with no sz - Continue Keppra   Hypertension, Hypertensive urgency - Amlodipine 10 mg daily.   - Lisinopril 40 mg daily.   - Hydralazine titrated to 75mg  TID  - Goal SBP <160  - Long-term BP goal normotensive - Monitor kidney function with daily BMP  Bradycardia - Occurred early in ICU stay, asymptomatic, no further occurences  Hyperlipidemia - Home meds:  Lipitor 40 - LDL 25, goal < 70 - Will reduce to Lipitor 20 - Continue statin at discharge  Leg swelling - h/o DVT bilat LE dopplers +right common femoral vein, and right femoral vein, left femoral vein, and left popliteal vein.  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  - Anticoagulation d/t history of DVT, presence of the chronic DVTs as well as personal history of PE. This is with the understanding that he is at high risk for bleeding. - transitioned to Pradaxa 150mg  bid (02/09/21) (pt previously on Apixaban for CVA and Xarelto)  CKD 3 -Ctn baseline around 1.66 -Ctn 1.83->1.72->1.64->1.35->1.27->1.5->1.31 -Monitor with daily BMP as BP regimen is titrated  Hypokalemia - Monitor and replete  - 3.8->3.2->3.4->3.1 - potassium chloride 40mg  bid scheduled   Other Stroke Risk Factors  Cigarette smoker advised to stop smoking  Obesity, Body mass index is 33.94 kg/m., BMI >/= 30 associated with increased stroke risk, recommend weight loss, diet and exercise as appropriate   Hx stroke/TIA  OSA  Other risk factors  HgbA1c 5.9, goal < 7.0   DISCHARGE EXAM Blood pressure (!) 150/80, pulse 60, temperature 98.5 F (36.9 C), temperature source Oral, resp. rate 18, height 6\' 1"  (1.854 m), weight 116.7 kg, SpO2 99 %.  PHYSICAL EXAM General: Pleasant obese middle-aged African-American male awake alert sitting up in bed in NAD.  HEENT: Normocephalic, atraumatic Cardiovascular: Regular rhythm  Respiratory: No extra work of breathing on room air Extremities: Appear warm and well perfused with bilateral lower extremity swelling. PSYCH: Pleasant and calm  Neurological exam Awake alert oriented x4 Speech clear and appropriate in context Reduced attention concentration Cranial nerves: Pupils equal round react light, extraocular movements intact, right gaze deviation but is able to look to the left past midline.  Face appears asymmetric with left lower facial weakness, left homonymous hemianopsia noted on exam. Motor examination: Left upper extremity 1/5, left lower extremity 3/5 (drift).  Right upper and lower extremity are full strength. Sensation diminished on the left and also extinction on double  simultaneous stimulation. Coordination: No dysmetria on the right.  Unable to perform the left.  Discharge Diet      Diet   DIET DYS 3 Room service appropriate? Yes; Fluid consistency: Thin   liquids  Allergies as of 02/12/2021   No Known Allergies     Medication List    STOP taking these medications   apixaban 5 MG Tabs tablet Commonly known as: ELIQUIS   CENTRUM ADULTS PO     TAKE these medications   acetaminophen 325 MG tablet Commonly known as: TYLENOL Take 2 tablets (650 mg total) by mouth every 4 (four) hours as needed for mild pain (or temp > 37.5 C (99.5 F)).   acetaminophen 160 MG/5ML solution Commonly known as: TYLENOL Place 20.3 mLs (650 mg total) into feeding tube every 4 (four) hours as needed for mild pain (or temp > 37.5 C (99.5 F)).   acetaminophen 650 MG suppository Commonly known as: TYLENOL Place 1 suppository (650 mg total) rectally every 4 (four) hours as needed for mild pain (or temp > 37.5 C (99.5 F)).   amLODipine 10 MG tablet Commonly known as: NORVASC Take 1 tablet (10 mg total) by mouth daily. Start taking on: February 13, 2021 What changed:   medication strength  how much to take   aspirin 81 MG EC tablet Take 1 tablet (81 mg total) by mouth daily. Swallow whole. Start taking on: February 13, 2021   atorvastatin 40 MG tablet Commonly known as: LIPITOR TAKE 1 TABLET BY MOUTH EVERY DAY   dabigatran 150 MG Caps capsule Commonly known as: PRADAXA Take 1 capsule (150 mg total) by mouth every 12 (twelve) hours.   docusate sodium 100 MG capsule Commonly known as: COLACE Take 1 capsule (100 mg total) by mouth daily. Start taking on: February 13, 2021   fluticasone 50 MCG/ACT nasal spray Commonly known as: FLONASE Place 1 spray into both nostrils daily as needed for allergies or rhinitis.   hydrALAZINE 25 MG tablet Commonly known as: APRESOLINE Take 3 tablets (75 mg total) by mouth every 8 (eight) hours.   levETIRAcetam 500 MG  tablet Commonly known as: KEPPRA Take 1 tablet (500 mg total) by mouth 2 (two) times daily.   levothyroxine 25 MCG tablet Commonly known as: SYNTHROID TAKE 1 TABLET BY MOUTH EVERY DAY What changed: when to take this   lisinopril 40 MG tablet Commonly known as: ZESTRIL Take 1 tablet (40 mg total) by mouth daily. Start taking on: February 13, 2021   pantoprazole 40 MG tablet Commonly known as: PROTONIX Take 1 tablet (40 mg total) by mouth at bedtime.   potassium chloride SA 20 MEQ tablet Commonly known as: KLOR-CON Take 2 tablets (40 mEq total) by mouth 2 (two) times daily.   Resource ThickenUp Clear Powd Take 120 g by mouth as needed.   senna-docusate 8.6-50 MG  tablet Commonly known as: Senokot-S Take 1 tablet by mouth at bedtime.      DISCHARGE PLAN  Disposition:  Transfer to [facility] for ongoing PT, OT and ST  Started on Pradaxa 150mg  bid on 02/09/2021 and continue Aspirin 81mg  daily  Recommend ongoing stroke risk factor control by Primary Care Physician at time of discharge from inpatient rehabilitation.  Follow-up PCP Doree Albee, MD in 2 weeks following discharge from rehab.  Refer to [place] lab for treatment for central Sleep apnoea  Follow-up in [facility] in 6 weeks following discharge from rehab, office to schedule an appointment.   34  minutes were spent preparing discharge.  Delila A Bailey-Modzik   I have personally obtained history,examined this patient, reviewed notes, independently viewed imaging studies, participated in medical decision making and plan of care.ROS completed by me personally and pertinent positives fully documented  I have made any additions or clarifications directly to the above note. Agree with note above.    Antony Contras, MD Medical Director [facility] Pager: [phone] 02/12/2021 1:16 PM

## 2021-02-12 NOTE — Progress Notes (Addendum)
Inpatient Rehabilitation Admissions Coordinator   I have insurance approval and CIR bed to admit patient to today. I have alerted acute team and TOC. I will make the arrangements to admit today. I have left voicemail for pt's sister, Deneise Lever, of admit to CIR plans for today.  Danne Baxter, RN, MSN Rehab Admissions Coordinator [phone] 02/12/2021 10:11 AM

## 2021-02-12 NOTE — Progress Notes (Signed)
Jamse Arn, MD  Physician  Physical Medicine and Rehabilitation  PMR Pre-admission      Signed  Date of Service:  02/10/2021  1:05 PM      Related encounter: ED to Hosp-Admission (Discharged) from 02/02/2021 in [facility]       Signed          Show:Clear all [x] Manual[x] Template[x] Copied  Added by: [x] Cristina Gong, RN   [] Hover for details  PMR Admission Coordinator Pre-Admission Assessment   Patient: Matthew Wise is an 60 y.o., male MRN: [id number] DOB: [birth year] Height: 6\' 1"  (185.4 cm) Weight: 116.7 kg                                                                                                                                                  Insurance Information HMO: yes    PPO:      PCP:      IPA:      80/20:      OTHER:  PRIMARY: [place] [employer]      Policy#: [id number]      Subscriber: pt CM Name: [name]     Phone#: [phone] option 3     Fax#: [phone] Pre-Cert#: [id number]  approved for 7 days    Employer: n/a Benefits:  Phone #: [phone]     Name: 3/14 Eff. Date: 11/30/2020     Deduct: none      Out of Pocket Max: $4500      Life Max: none  CIR: $325 co pay per day days 1 until 5      SNF: no copay days 1 until 20; $188 co pay per day days 45 until 100 Outpatient: $30 per visit     Co-Pay: visits per medical neccesity Home Health: 100%      Co-Pay: visits per medical neccesity DME: 80%     Co-Pay: 20% Providers: in network  SECONDARY: none      Policy#:       Phone#:    Development worker, community:       Phone#:    The Engineer, petroleum" for patients in Inpatient Rehabilitation Facilities with attached "Privacy Act [place] Records" was provided and verbally reviewed with: Patient   Emergency Contact Information         Contact Information     Name Relation Home Work Mobile    [place] Sister [phone]   [phone]    [place] Niece     [phone]        Current Medical History  Patient Admitting Diagnosis: CVA  History of Present Illness:  60 y.o. right-handed male with history of  pulmonary emboli maintained on Eliquis, hypothyroidism, hypertension, CKD stage III, hyperlipidemia, prediabetes, sickle cell disease, tobacco abuse as well as right MCA infarction recently admitted 01/21/21-01/27/21 and discharged home ambulating 150  feet with rolling walker.   Presented 02/03/2021 with left-sided weakness of acute onset.  Cranial CT scan showed no acute intracranial hemorrhage.  Suspect acute infarct of the right frontal parietal lobe posterior superior to chronic infarct with probable involvement of lateral precentral and postcentral gyri.  Patient did not receive TPA.  CT angiogram of head and neck showed abrupt occlusion of the right M1 segment new as compared to prior CTA of 01/25/2021.  Acute core infarct of the posterior right frontal parietal region largely corresponding with a completed infarct as seen on prior MRI.  EEG showed evidence of focal seizure arising from right posterior quadrant during which patient was noted to have left upper extremity twitching.  Additionally there was cortical dysfunction in the right frontal temporal region likely due to underlying stroke.  Most recent echocardiogram with ejection fraction of 55 to 60%.  Admission chemistries unremarkable except creatinine 1.83, urine drug screen negative, hemoglobin A1c 5.9, alcohol negative.  Patient underwent arteriogram with revascularization of right MCA M1 segment per interventional radiology.  Latest follow-up MRI showed large acute right MCA territory infarct with petechial hemorrhage and minimal leftward midline shift as well as small acute left cerebellar infarct new small acute early subacute infarct in left occipital left parietal left frontal lobes.  Bilateral lower extremity Dopplers showed findings consistent with age-indeterminate DVT involving the right common femoral vein  and right femoral vein as well as left femoral vein and left popliteal vein, left common femoral and SF junction.  Patient was placed on intravenous heparin and remains on low-dose aspirin.  He has been loaded for Keppra for suspected seizure.  Maintained on a dysphagia #3 thin liquid diet.     Complete NIHSS TOTAL: 8 Glasgow Coma Scale Score: 15   Past Medical History      Past Medical History:  Diagnosis Date  . Acute CVA (cerebrovascular accident) ([place]) 09/09/2020  . Chronic anticoagulation 11/14/2014  . CKD (chronic kidney disease) stage 3, GFR 30-59 ml/min (HCC) 10/24/2019  . CKD (chronic kidney disease) stage 3, GFR 30-59 ml/min (HCC) 10/24/2019  . Clotting disorder ([place])    . 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 DVT (deep venous thrombosis) ([place]) 06/15/2011  . DVT of leg (deep venous thrombosis) (HCC)      rt. leg  . Essential hypertension 11/28/2020  . H/O PE (pulmonary embolism) 06/15/2011  . Hyperlipidemia 11/28/2020  . Inferior vena caval thrombosis (HCC)      Chronic  . Prediabetes 10/24/2019  . Thyroid nodule 01/25/2021      Family History  family history includes Arthritis in his brother and mother; COPD in his father; Diabetes in his brother, mother, and sister; Hypertension in his brother; Pulmonary embolism in his sister; Stroke in his brother and mother; Throat cancer in his brother.   Prior Rehab/Hospitalizations:  Has the patient had prior rehab or hospitalizations prior to admission? Yes   Has the patient had major surgery during 100 days prior to admission? No   Current Medications    Current Facility-Administered Medications:  .  acetaminophen (TYLENOL) tablet 650 mg, 650 mg, Oral, Q4H PRN, 650 mg at 02/05/21 0402 **OR** acetaminophen (TYLENOL) 160 MG/5ML solution 650 mg, 650 mg, Per Tube, Q4H PRN **OR** acetaminophen (TYLENOL) suppository 650 mg, 650 mg, Rectal, Q4H  PRN, Bailey-Modzik, Delila A, NP .  amLODipine (NORVASC) tablet 10 mg, 10 mg, Oral, Daily, Bailey-Modzik, Delila A, NP,  10 mg at 02/12/21 1057 .  aspirin EC tablet 81 mg, 81 mg, Oral, Daily, Bailey-Modzik, Delila A, NP, 81 mg at 02/12/21 1058 .  chlorhexidine (PERIDEX) 0.12 % solution 15 mL, 15 mL, Mouth Rinse, BID, Bailey-Modzik, Delila A, NP, 15 mL at 02/12/21 1058 .  dabigatran (PRADAXA) capsule 150 mg, 150 mg, Oral, Q12H, Pham, Minh Q, RPH-CPP, 150 mg at 02/12/21 1058 .  docusate sodium (COLACE) capsule 100 mg, 100 mg, Oral, Daily, Bailey-Modzik, Delila A, NP, 100 mg at 02/12/21 1058 .  hydrALAZINE (APRESOLINE) injection 10 mg, 10 mg, Intravenous, Q4H PRN, Bailey-Modzik, Delila A, NP, 10 mg at 02/08/21 1103 .  hydrALAZINE (APRESOLINE) tablet 75 mg, 75 mg, Oral, Q8H, Olivencia-Simmons, Ivelisse, NP, 75 mg at 02/12/21 1062 .  levETIRAcetam (KEPPRA) tablet 500 mg, 500 mg, Oral, BID, Bailey-Modzik, Delila A, NP, 500 mg at 02/12/21 1058 .  levothyroxine (SYNTHROID) tablet 25 mcg, 25 mcg, Oral, Q0600, Bailey-Modzik, Delila A, NP, 25 mcg at 02/12/21 0628 .  lisinopril (ZESTRIL) tablet 40 mg, 40 mg, Oral, Daily, Bailey-Modzik, Delila A, NP, 40 mg at 02/12/21 1057 .  MEDLINE mouth rinse, 15 mL, Mouth Rinse, BID, Bailey-Modzik, Delila A, NP, 15 mL at 02/12/21 1058 .  pantoprazole (PROTONIX) EC tablet 40 mg, 40 mg, Oral, QHS, Bailey-Modzik, Delila A, NP, 40 mg at 02/11/21 2217 .  potassium chloride SA (KLOR-CON) CR tablet 40 mEq, 40 mEq, Oral, BID, Olivencia-Simmons, Ivelisse, NP, 40 mEq at 02/12/21 1058 .  Resource ThickenUp Clear, , Oral, PRN, Bailey-Modzik, Delila A, NP, Given at 02/03/21 2109 .  senna-docusate (Senokot-S) tablet 1 tablet, 1 tablet, Oral, QHS, Bailey-Modzik, Delila A, NP, 1 tablet at 02/11/21 2218   Patients Current Diet:     Diet Order                      DIET DYS 3 Room service appropriate? Yes; Fluid consistency: Thin  Diet effective now                      Precautions  / Restrictions Precautions Precautions: Fall Precaution Comments: L hemiplegia Restrictions Weight Bearing Restrictions: No    Has the patient had 2 or more falls or a fall with injury in the past year?No  Slid from chair 3/15 in hospital   Prior Activity Level Limited Community (1-2x/wk): disabled   Prior Functional Level Prior Function Level of Independence: Independent with assistive device(s) Comments: Uses four prong cane, cooks for himself, uses scooter at [place]: Did the patient need help bathing, dressing, using the toilet or eating?  Independent   Indoor Mobility: Did the patient need assistance with walking from room to room (with or without device)? Independent   Stairs: Did the patient need assistance with internal or external stairs (with or without device)? Independent   Functional Cognition: Did the patient need help planning regular tasks such as shopping or remembering to take medications? Independent   Home Assistive Devices / Equipment Home Assistive Devices/Equipment: None Home Equipment: Cane - quad,Shower seat   Prior Device Use: Indicate devices/aids used by the patient prior to current illness, exacerbation or injury? 4 prong cane   Current Functional Level Cognition   Arousal/Alertness: Awake/alert Overall Cognitive Status: Impaired/Different from baseline Orientation Level: Oriented to person Following Commands: Follows one step commands consistently,Follows one step commands with increased time,Follows multi-step commands with increased time Safety/Judgement: Decreased awareness of safety,Decreased awareness of deficits General Comments: unable to discern if  pt's decreased verbalization during session is due to fatigue from the effort or depressed mood from experiencing his deficits more notably during therapy sessions Attention: Focused,Sustained,Selective,Alternating Focused Attention: Appears intact Sustained Attention: Appears  intact Selective Attention: Appears intact Selective Attention Impairment: Verbal basic Alternating Attention: Appears intact Memory: Impaired Memory Impairment: Decreased short term memory,Decreased recall of new information Decreased Short Term Memory: Verbal basic Awareness: Impaired Awareness Impairment: Emergent impairment Problem Solving: Impaired Problem Solving Impairment: Functional basic Executive Function: Self Monitoring,Self Correcting Self Monitoring: Impaired Self Monitoring Impairment: Verbal basic,Functional basic Safety/Judgment: Impaired    Extremity Assessment (includes Sensation/Coordination)   Upper Extremity Assessment: LUE deficits/detail LUE Deficits / Details: No active movement. Noting tone with extension at shoulder, elbow, and hand. LUE Sensation: decreased light touch LUE Coordination: decreased fine motor,decreased gross motor  Lower Extremity Assessment: Defer to PT evaluation LLE Deficits / Details: grossly 2/5. pt able to activate through minimal AROM LLE Sensation: decreased light touch (pt originally denies difference in sensation, unable to detect any touch to LLE, absent two-point discrimination) LLE Coordination: decreased fine motor,decreased gross motor     ADLs   Overall ADL's : Needs assistance/impaired Eating/Feeding: Minimal assistance,Sitting Eating/Feeding Details (indicate cue type and reason): Assistance for bilateral tasks Grooming: Wash/dry hands,Standing,Maximal assistance Grooming Details (indicate cue type and reason): Pt standing with Mod A +2. And then requiring Max A for standing balance with L knee blocked and support for L lateral lean. Max hand over hand for enagaging LUE to reach forward and turn on faucet and get soap. Pt performing bilateral coorindation to rub soap on L hand with RUE. Fatiguing and requiring to set for drying hands. Upper Body Bathing: Moderate assistance,Sitting Lower Body Bathing: Maximal  assistance,Sit to/from stand Upper Body Dressing : Moderate assistance,Sitting Lower Body Dressing: Maximal assistance,Sit to/from stand Lower Body Dressing Details (indicate cue type and reason): Pt attempting to bend forward and pull up sock over heal but unable. Requiring Max A to don sock Toilet Transfer: Moderate assistance,+2 for physical assistance,+2 for safety/equipment,Transfer board (lateral scoot to drop arm recliner) Toilet Transfer Details (indicate cue type and reason): Mod A +2 to sequencing and faciltiating hips over to recliner Functional mobility during ADLs: Moderate assistance,Maximal assistance,+2 for physical assistance,+2 for safety/equipment General ADL Comments: Continues to present with decreased coordination, balance, and functional use of LUE.     Mobility   Overal bed mobility: Needs Assistance Bed Mobility: Rolling,Sidelying to Sit Rolling: Supervision Sidelying to sit: Mod assist Supine to sit: Min assist,HOB elevated,+2 for physical assistance General bed mobility comments: Pt rolls to L with R hand to L rail with supervision. Pt attempted to move RLE with L but needed mod A to complete B LE's off bed and mod A at trunk to rise with use of rail     Transfers   Overall transfer level: Needs assistance Equipment used: Ambulation equipment used Transfer via Lift Equipment: Stedy Transfers: Sit to/from Stand Sit to Stand: Mod assist,+2 physical assistance Stand pivot transfers: Total assist,From elevated surface (with stedy)  Lateral/Scoot Transfers: Mod assist,+2 physical assistance General transfer comment: worked on sit<>stand multiple times within stedy, pt needing more assist after 3 reps due to fatigue. Performed in front of mirror working on maintaining midline during rising.     Ambulation / Gait / Stairs / Wheelchair Mobility   Ambulation/Gait General Gait Details: pregait activity in standing including standing wt shifting     Posture / Balance  Dynamic Sitting Balance Sitting balance - Comments: vc's  for midline sitting. Pt with more tone LUE today compared to yesterday Balance Overall balance assessment: Needs assistance Sitting-balance support: Single extremity supported,Feet supported Sitting balance-Leahy Scale: Good Sitting balance - Comments: vc's for midline sitting. Pt with more tone LUE today compared to yesterday Postural control: Left lateral lean Standing balance support: Bilateral upper extremity supported,During functional activity Standing balance-Leahy Scale: Poor Standing balance comment: UE support and mod/ max A +2 needed to maintain midline standing and allow for functional use of RUE     Special needs/care consideration Seizure precautions Smoker Sleep smart study Slid from chair to floor on 3/15 on acute hospital    Previous Home Environment  Living Arrangements: Alone  Lives With: Alone Available Help at Discharge: Family,Available 24 hours/day Type of Home: House Home Layout: One level Home Access: Stairs to enter Entrance Stairs-Rails: Right Entrance Stairs-Number of Steps: 2-3 Bathroom Shower/Tub: Optometrist: Yes Home Care Services: No   Discharge Living Setting Plans for Discharge Living Setting: Patient's home,Alone,House Type of Home at Discharge: House Discharge Home Layout: One level Discharge Home Access: Stairs to enter Entrance Stairs-Rails: Right Entrance Stairs-Number of Steps: 2-3 Discharge Bathroom Shower/Tub: Tub/shower unit Discharge Bathroom Toilet: Standard Discharge Bathroom Accessibility: Yes How Accessible: Accessible via walker Does the patient have any problems obtaining your medications?: No   Social/Family/Support Systems Contact Information: sister, Deneise Lever, main contact Anticipated Caregiver: Deneise Lever, sister , Arby Barrette, girlfriend and other family members Anticipated Caregiver's Contact Information: see  above Ability/Limitations of Caregiver: Arby Barrette works day shift Caregiver Availability: 24/7 Discharge Plan Discussed with Primary Caregiver: Yes Is Caregiver In Agreement with Plan?: Yes Does Caregiver/Family have Issues with Lodging/Transportation while Pt is in Rehab?: No   Goals Patient/Family Goal for Rehab: min PT, OT and supervision SLP Expected length of stay: [facility] 14 to 18 days Pt/Family Agrees to Admission and willing to participate: Yes Program Orientation Provided & Reviewed with Pt/Caregiver Including Roles  & Responsibilities: Yes   Decrease burden of Care through IP rehab admission: n/a   Possible need for SNF placement upon discharge:not anticipated   Patient Condition: This patient's medical and functional status has changed since the consult dated: 02/07/2021 in which the Rehabilitation Physician determined and documented that the patient's condition is appropriate for intensive rehabilitative care in an inpatient rehabilitation facility. See "History of Present Illness" (above) for medical update. Functional changes are: overall mod assist. Patient's medical and functional status update has been discussed with the Rehabilitation physician and patient remains appropriate for inpatient rehabilitation. Will admit to inpatient rehab today.   Preadmission Screen Completed By:  Cleatrice Burke, RN, 02/12/2021 11:04 AM ______________________________________________________________________   Discussed status with Dr. Posey Pronto on 3/16/2022at 1101 and received approval for admission today.   Admission Coordinator:  Cleatrice Burke, time 3903 Date 02/12/2021               Revision History                                  Note Details  Author Posey Pronto, Domenick Bookbinder, MD File Time 02/12/2021 11:17 AM  Author Type Physician Status Signed  Last Editor Jamse Arn, MD Service Physical Medicine and [place] # [phone] Admit Date 02/12/2021

## 2021-02-12 NOTE — Progress Notes (Signed)
Inpatient Rehabilitation Medication Review by a Pharmacist  A complete drug regimen review was completed for this patient to identify any potential clinically significant medication issues.  Clinically significant medication issues were identified:  no  Check AMION for pharmacist assigned to patient if future medication questions/issues arise during this admission.  Pharmacist comments:   Time spent performing this drug regimen review (minutes):  10   Bernetta Sutley 02/12/2021 6:21 PM

## 2021-02-12 NOTE — Progress Notes (Signed)
Matthew Ribas, MD  Physician  Physical Medicine and Rehabilitation  Consult Note      Signed  Date of Service:  02/07/2021  5:34 AM      Related encounter: ED to Hosp-Admission (Discharged) from 02/02/2021 in [place] 3W [facility]       Signed      Expand All Collapse All     Show:Clear all [x] Manual[x] Template[] Copied  Added by: [x] Angiulli, Lavon Paganini, PA-C[x] Ranell Patrick, Clide Deutscher, MD   [] Hover for details           Physical Medicine and Rehabilitation Consult Reason for Consult: Left side weakness Referring Physician: Dr.Arora     HPI: Matthew Wise is a 60 y.o. right-handed male with history of  pulmonary emboli maintained on Eliquis, hypothyroidism, hypertension, CKD stage III, hyperlipidemia, prediabetes, sickle cell disease, tobacco abuse as well as right MCA infarction recently admitted 01/21/21-01/27/21 and discharged home ambulating 150 feet with rolling walker.  Per chart review patient lives alone.  1 level home 3 steps to entry.  Independent with assistive device.  He has a sister who checks on him regularly.  Presented 02/03/2021 with left-sided weakness of acute onset.  Cranial CT scan showed no acute intracranial hemorrhage.  Suspect acute infarct of the right frontal parietal lobe posterior superior to chronic infarct with probable involvement of lateral precentral and postcentral gyri.  Patient did not receive TPA.  CT angiogram of head and neck showed abrupt occlusion of the right M1 segment new as compared to prior CTA of 01/25/2021.  Acute core infarct of the posterior right frontal parietal region largely corresponding with a completed infarct as seen on prior MRI.  EEG showed evidence of focal seizure arising from right posterior quadrant during which patient was noted to have left upper extremity twitching.  Additionally there was cortical dysfunction in the right frontal temporal region likely due to underlying stroke.  Most recent echocardiogram with  ejection fraction of 55 to 60%.  Admission chemistries unremarkable except creatinine 1.83, urine drug screen negative, hemoglobin A1c 5.9, alcohol negative.  Patient underwent arteriogram with revascularization of right MCA M1 segment per interventional radiology.  Latest follow-up MRI showed large acute right MCA territory infarct with petechial hemorrhage and minimal leftward midline shift as well as small acute left cerebellar infarct new small acute early subacute infarct in left occipital left parietal left frontal lobes.  Bilateral lower extremity Dopplers showed findings consistent with age-indeterminate DVT involving the right common femoral vein and right femoral vein as well as left femoral vein and left popliteal vein, left common femoral and SF junction.  Patient was placed on intravenous heparin and remains on low-dose aspirin.  He has been loaded for Keppra for suspected seizure.  Maintained on a dysphagia #3 thin liquid diet.  Therapy evaluations completed due to patient's left-sided weakness physical medicine rehab consult requested. Matthew Wise is agreeable to CIR.      Review of Systems  Constitutional: Negative for chills and fever.  HENT: Negative for hearing loss.   Eyes: Negative for blurred vision and double vision.  Respiratory: Negative for cough and shortness of breath.   Cardiovascular: Positive for leg swelling. Negative for chest pain and palpitations.  Gastrointestinal: Positive for constipation. Negative for heartburn, nausea and vomiting.  Genitourinary: Negative for dysuria, flank pain and hematuria.  Musculoskeletal: Positive for joint pain and myalgias.  Skin: Negative for rash.  Neurological: Positive for weakness.  All other systems reviewed and are negative.  Past Medical History:  Diagnosis Date  . Acute CVA (cerebrovascular accident) ([place]) 09/09/2020  . Chronic anticoagulation 11/14/2014  . CKD (chronic kidney disease) stage 3, GFR 30-59 ml/min (HCC)  10/24/2019  . CKD (chronic kidney disease) stage 3, GFR 30-59 ml/min (HCC) 10/24/2019  . Clotting disorder ([place])    . 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 DVT (deep venous thrombosis) ([place]) 06/15/2011  . DVT of leg (deep venous thrombosis) (HCC)      rt. leg  . Essential hypertension 11/28/2020  . H/O PE (pulmonary embolism) 06/15/2011  . Hyperlipidemia 11/28/2020  . Inferior vena caval thrombosis (HCC)      Chronic  . Prediabetes 10/24/2019  . Thyroid nodule 01/25/2021         Past Surgical History:  Procedure Laterality Date  . COLONOSCOPY WITH PROPOFOL N/A 10/29/2015    incomplete due to redundant colon. two simple tubular adenomas removed. patient was advised to go to [facility] for colonoscopy but he did not keep appt.  . IR CT HEAD LTD   02/02/2021  . IR PERCUTANEOUS ART THROMBECTOMY/INFUSION INTRACRANIAL INC DIAG ANGIO   02/02/2021  . RADIOLOGY WITH ANESTHESIA N/A 02/02/2021    Procedure: IR WITH ANESTHESIA;  Surgeon: Luanne Bras, MD;  Location: [facility];  Service: Radiology;  Laterality: N/A;  . TEE WITHOUT CARDIOVERSION N/A 09/10/2020    Procedure: TRANSESOPHAGEAL ECHOCARDIOGRAM (TEE) WITH PROPOFOL;  Surgeon: Satira Sark, MD;  Location: [facility];  Service: Cardiovascular;  Laterality: N/A;         Family History  Problem Relation Age of Onset  . COPD Father    . Stroke Brother    . Pulmonary embolism Sister    . Throat cancer Brother    . Arthritis Mother    . Diabetes Mother    . Stroke Mother    . Hypertension Brother    . Diabetes Brother    . Arthritis Brother    . Diabetes Sister    . Colon cancer Neg Hx      Social History:  reports that he has been smoking cigarettes. He started smoking about 37 years ago. He has a 30.00 pack-year smoking history. He has never used smokeless tobacco. He reports previous alcohol use of about 3.0 standard drinks of alcohol per week. He reports  previous drug use. Allergies: No Known Allergies       Medications Prior to Admission  Medication Sig Dispense Refill  . amLODipine (NORVASC) 5 MG tablet TAKE 1 TABLET (5 MG TOTAL) BY MOUTH DAILY. 90 tablet 0  . apixaban (ELIQUIS) 5 MG TABS tablet Take 1 tablet (5 mg total) by mouth 2 (two) times daily. 60 tablet 3  . atorvastatin (LIPITOR) 40 MG tablet TAKE 1 TABLET BY MOUTH EVERY DAY (Patient taking differently: Take 40 mg by mouth daily.) 90 tablet 0  . fluticasone (FLONASE) 50 MCG/ACT nasal spray Place 1 spray into both nostrils daily as needed for allergies or rhinitis.      Marland Kitchen levothyroxine (SYNTHROID) 25 MCG tablet TAKE 1 TABLET BY MOUTH EVERY DAY (Patient taking differently: Take 25 mcg by mouth daily before breakfast.) 90 tablet 0  . Multiple Vitamins-Minerals (CENTRUM ADULTS PO) Take 1 tablet by mouth daily.          Home: Home Living Family/patient expects to be discharged to:: Private residence Living Arrangements: Alone Available Help at Discharge: Other (Comment) (sisters check on him  regularly) Type of Home: House Home Access: Stairs to enter [employer] of Steps: 2-3 Entrance Stairs-Rails: Right Home Layout: One level Bathroom Shower/Tub: Chiropodist: Standard Bathroom Accessibility: Yes Home Equipment: Cane - quad,Shower seat  Lives With: Alone  Functional History: Prior Function Level of Independence: Independent with assistive device(s) Comments: Uses four prong cane, cooks for himself, uses scooter at grocery Functional Status:  Mobility: Bed Mobility Overal bed mobility: Needs Assistance Bed Mobility: Supine to Sit Supine to sit: Min assist,Mod assist,HOB elevated General bed mobility comments: Min A for bringing BLEs towards EOB. Min-Mod A for elevating trunk Transfers Overall transfer level: Needs assistance Equipment used: 2 person hand held assist Transfer via Lift Equipment: Stedy Transfers: Sit to/from Stand Sit to  Stand: Mod assist,+2 physical assistance,+2 safety/equipment,From elevated surface Stand pivot transfers: Total assist (with stedy) General transfer comment: Mod A +2 to power up into standing. Blocking at L knee. used stedy to transfer from EOB to recliner, requires support on R side in stedy due to poor maintenance of midline in sitting Ambulation/Gait General Gait Details: deferred at this time for safety, pt with absent functional use of LLE and strong L-sided lean   ADL: ADL Overall ADL's : Needs assistance/impaired Eating/Feeding: Minimal assistance,Sitting Eating/Feeding Details (indicate cue type and reason): Assistance for bilateral tasks Grooming: Minimal assistance,Sitting,Moderate assistance Upper Body Bathing: Moderate assistance,Sitting Lower Body Bathing: Maximal assistance,Sit to/from stand Upper Body Dressing : Moderate assistance,Sitting Lower Body Dressing: Maximal assistance,Sit to/from stand Lower Body Dressing Details (indicate cue type and reason): Pt attempting to bend forward and pull up sock over heal but unable. Requiring Max A to don sock Functional mobility during ADLs: Moderate assistance,+2 for physical assistance (sit<>stand with and without stedy) General ADL Comments: Pt presenting with decreased balance, strength, cognition, and use of LUE.   Cognition: Cognition Overall Cognitive Status: Impaired/Different from baseline Arousal/Alertness: Awake/alert Orientation Level: Oriented X4 Attention: Focused,Sustained,Selective,Alternating Focused Attention: Appears intact Sustained Attention: Appears intact Selective Attention: Appears intact Selective Attention Impairment: Verbal basic Alternating Attention: Appears intact Memory: Impaired Memory Impairment: Decreased short term memory,Decreased recall of new information Decreased Short Term Memory: Verbal basic Awareness: Impaired Awareness Impairment: Emergent impairment Problem Solving:  Impaired Problem Solving Impairment: Functional basic Executive Function: Self Monitoring,Self Correcting Self Monitoring: Impaired Self Monitoring Impairment: Verbal basic,Functional basic Safety/Judgment: Impaired Cognition Arousal/Alertness: Awake/alert Behavior During Therapy: Flat affect Overall Cognitive Status: Impaired/Different from baseline Area of Impairment: Problem solving,Safety/judgement Following Commands: Follows one step commands with increased time Safety/Judgement: Decreased awareness of safety Problem Solving: Slow processing,Difficulty sequencing,Requires verbal cues General Comments: Presenting with flat affect. Feel possible pt is overwhelmed by situation. Pt following simple, direct cues (unrelation of L side) majority of time. Pt with inattention to L (body and environment).   Blood pressure (!) 169/81, pulse (!) 56, temperature 98.8 F (37.1 C), temperature source Axillary, resp. rate 12, height 6\' 1"  (1.854 m), weight 116.7 kg, SpO2 99 %. Physical Exam Gen: no distress, normal appearing HEENT: oral mucosa pink and moist, NCAT Cardio: Bradycardic Chest: normal effort, normal rate of breathing Abd: soft, non-distended Ext: no edema Psych: pleasant, normal affect Skin: intact Neurological:     Comments: Patient is awake and alert.  Makes eye contact with examiner.  Provides name and age.  Follows simple commands. LUE 1/5, LLE 2-3/5.      Lab Results Last 24 Hours       Results for orders placed or performed during the hospital encounter of 02/02/21 (from the past  24 hour(s))  Sodium     Status: None    Collection Time: 02/06/21  7:41 AM  Result Value Ref Range    Sodium 142 135 - 145 mmol/L  APTT     Status: Abnormal    Collection Time: 02/06/21  7:41 AM  Result Value Ref Range    aPTT 72 (H) 24 - 36 seconds  Comprehensive metabolic panel     Status: Abnormal    Collection Time: 02/06/21  1:45 PM  Result Value Ref Range    Sodium 142 135 - 145  mmol/L    Potassium 3.6 3.5 - 5.1 mmol/L    Chloride 111 98 - 111 mmol/L    CO2 24 22 - 32 mmol/L    Glucose, Bld 134 (H) 70 - 99 mg/dL    BUN 10 6 - 20 mg/dL    Creatinine, Ser 1.35 (H) 0.61 - 1.24 mg/dL    Calcium 9.2 8.9 - 10.3 mg/dL    Total Protein 6.9 6.5 - 8.1 g/dL    Albumin 3.3 (L) 3.5 - 5.0 g/dL    AST 25 15 - 41 U/L    ALT 20 0 - 44 U/L    Alkaline Phosphatase 73 38 - 126 U/L    Total Bilirubin 1.5 (H) 0.3 - 1.2 mg/dL    GFR, Estimated >60 >60 mL/min    Anion gap 7 5 - 15  Sodium     Status: None    Collection Time: 02/06/21  6:43 PM  Result Value Ref Range    Sodium 144 135 - 145 mmol/L  Glucose, capillary     Status: None    Collection Time: 02/06/21 11:45 PM  Result Value Ref Range    Glucose-Capillary 90 70 - 99 mg/dL  Heparin level (unfractionated)     Status: Abnormal    Collection Time: 02/07/21  3:51 AM  Result Value Ref Range    Heparin Unfractionated 0.76 (H) 0.30 - 0.70 IU/mL  CBC     Status: None    Collection Time: 02/07/21  3:51 AM  Result Value Ref Range    WBC 6.8 4.0 - 10.5 K/uL    RBC 4.70 4.22 - 5.81 MIL/uL    Hemoglobin 13.4 13.0 - 17.0 g/dL    HCT 39.0 39.0 - 52.0 %    MCV 83.0 80.0 - 100.0 fL    MCH 28.5 26.0 - 34.0 pg    MCHC 34.4 30.0 - 36.0 g/dL    RDW 13.5 11.5 - 15.5 %    Platelets 186 150 - 400 K/uL    nRBC 0.0 0.0 - 0.2 %  APTT     Status: Abnormal    Collection Time: 02/07/21  3:51 AM  Result Value Ref Range    aPTT 59 (H) 24 - 36 seconds  Sodium     Status: None    Collection Time: 02/07/21  3:51 AM  Result Value Ref Range    Sodium 143 135 - 145 mmol/L       Imaging Results (Last 48 hours)  Overnight EEG with video   Result Date: 02/05/2021 Lora Havens, MD     02/06/2021  1:29 PM Patient Name: Matthew Wise MRN: [id number] Epilepsy Attending: Lora Havens Referring Physician/Provider: Dr Kerney Elbe Duration: 02/04/2021 2251 to 02/05/2021 2251   Patient history: 60 year old male with right upper extremity  twitching.  EEG to evaluate for seizures.   Level of alertness: Awake, asleep   AEDs during EEG  study: None   Technical aspects: This EEG study was done with scalp electrodes positioned according to the 10-20 International system of electrode placement. Electrical activity was acquired at a sampling rate of 500Hz  and reviewed with a high frequency filter of 70Hz  and a low frequency filter of 1Hz . EEG data were recorded continuously and digitally stored. Description: The posterior dominant rhythm consists of 9 Hz activity of moderate voltage (25-35 uV) seen predominantly in posterior head regions, symmetric and reactive to eye opening and eye closing. Sleep was characterized by vertex waves, sleep spindles (12-14hz ), maximal frontocentral region. EEG showed continuous 3-5hz  theta-delta slowing in right frontotemporal region. Event button was pressed on 02/05/2021 at 0213 for left face and arm twitching. Concomitant EEG showed sharp waves in right posterior quadrant which gradually evolved in frequency and spread to involve left posterior quadrant. Two more seizures were noted without definite clinical signs arising from right posterior quadrant on 02/04/2021 2343, on 02/05/2021 0021. ABNORMALITY - Focal seizure, right posterior quadrant - Continuous slow, right frontotemporal region IMPRESSION: This study showed evidence of focal seizure arising from right posterior quadrant during which patient was noted to have left upper extremity twitching as described above. Additionally there was cortical dysfunction in right frontotemporal region likely due to underlying stroke.   Priyanka Barbra Sarks         Assessment/Plan: Diagnosis: Occlusion of R M1 segment, infarct in posterior R frontoparietal region 1. Does the need for close, 24 hr/day medical supervision in concert with the patient's rehab needs make it unreasonable for this patient to be served in a less intensive setting? Yes 2. Co-Morbidities requiring  supervision/potential complications:  1. CKDIII: monitor Cr regularly 2. DVT: continue heparin 3. HTN: monitor BP TID 4. PE 5. HLD 3. Due to bladder management, bowel management, safety, skin/wound care, disease management, medication administration, pain management and patient education, does the patient require 24 hr/day rehab nursing? Yes 4. Does the patient require coordinated care of a physician, rehab nurse, therapy disciplines of PT, OT, SLP to address physical and functional deficits in the context of the above medical diagnosis(es)? Yes Addressing deficits in the following areas: balance, endurance, locomotion, strength, transferring, bowel/bladder control, bathing, dressing, feeding, grooming, toileting, cognition, swallowing and psychosocial support 5. Can the patient actively participate in an intensive therapy program of at least 3 hrs of therapy per day at least 5 days per week? Potentially 6. The potential for patient to make measurable gains while on inpatient rehab is excellent 7. Anticipated functional outcomes upon discharge from inpatient rehab are min assist  with PT, min assist with OT, min assist with SLP. 8. Estimated rehab length of stay to reach the above functional goals is: 14-18 days minA 9. Anticipated discharge destination: Home 10. Overall Rehab/Functional Prognosis: excellent   RECOMMENDATIONS: This patient's condition is appropriate for continued rehabilitative care in the following setting: CIR Patient has agreed to participate in recommended program. Yes Note that insurance prior authorization may be required for reimbursement for recommended care.   Comment: Thank you for this consult. Admission coordinator to follow.    I have personally performed a face to face diagnostic evaluation, including, but not limited to relevant history and physical exam findings, of this patient and developed relevant assessment and plan.  Additionally, I have reviewed and  concur with the physician assistant's documentation above.   Leeroy Cha, MD    Lavon Paganini Angiulli, PA-C 02/07/2021          Revision History  Routing History                   Note Details  Author Ranell Patrick, Clide Deutscher, MD File Time 02/07/2021  2:32 PM  Author Type Physician Status Signed  Last Editor Matthew Ribas, MD Service Physical Medicine and [place] # [id number] Admit Date 02/12/2021

## 2021-02-13 DIAGNOSIS — I63511 Cerebral infarction due to unspecified occlusion or stenosis of right middle cerebral artery: Secondary | ICD-10-CM

## 2021-02-13 LAB — CBC WITH DIFFERENTIAL/PLATELET
Abs Immature Granulocytes: 0.02 10*3/uL (ref 0.00–0.07)
Basophils Absolute: 0 10*3/uL (ref 0.0–0.1)
Basophils Relative: 1 %
Eosinophils Absolute: 0.1 10*3/uL (ref 0.0–0.5)
Eosinophils Relative: 2 %
HCT: 40.7 % (ref 39.0–52.0)
Hemoglobin: 14.5 g/dL (ref 13.0–17.0)
Immature Granulocytes: 0 %
Lymphocytes Relative: 35 %
Lymphs Abs: 2 10*3/uL (ref 0.7–4.0)
MCH: 29.2 pg (ref 26.0–34.0)
MCHC: 35.6 g/dL (ref 30.0–36.0)
MCV: 82.1 fL (ref 80.0–100.0)
Monocytes Absolute: 0.7 10*3/uL (ref 0.1–1.0)
Monocytes Relative: 12 %
Neutro Abs: 2.8 10*3/uL (ref 1.7–7.7)
Neutrophils Relative %: 50 %
Platelets: 271 10*3/uL (ref 150–400)
RBC: 4.96 MIL/uL (ref 4.22–5.81)
RDW: 13.3 % (ref 11.5–15.5)
WBC: 5.6 10*3/uL (ref 4.0–10.5)
nRBC: 0 % (ref 0.0–0.2)

## 2021-02-13 LAB — COMPREHENSIVE METABOLIC PANEL
ALT: 41 U/L (ref 0–44)
AST: 32 U/L (ref 15–41)
Albumin: 3.7 g/dL (ref 3.5–5.0)
Alkaline Phosphatase: 88 U/L (ref 38–126)
Anion gap: 11 (ref 5–15)
BUN: 10 mg/dL (ref 6–20)
CO2: 24 mmol/L (ref 22–32)
Calcium: 10.1 mg/dL (ref 8.9–10.3)
Chloride: 100 mmol/L (ref 98–111)
Creatinine, Ser: 1.41 mg/dL — ABNORMAL HIGH (ref 0.61–1.24)
GFR, Estimated: 58 mL/min — ABNORMAL LOW (ref >60)
Glucose, Bld: 96 mg/dL (ref 70–99)
Potassium: 3.7 mmol/L (ref 3.5–5.1)
Sodium: 135 mmol/L (ref 135–145)
Total Bilirubin: 0.9 mg/dL (ref 0.3–1.2)
Total Protein: 7.5 g/dL (ref 6.5–8.1)

## 2021-02-13 MED ORDER — BISACODYL 10 MG RE SUPP
10.0000 mg | Freq: Every day | RECTAL | Status: DC | PRN
  Administered 2021-02-13: 10 mg via RECTAL
  Filled 2021-02-13: qty 1

## 2021-02-13 NOTE — Progress Notes (Signed)
Inpatient Rehabilitation  Patient information reviewed and entered into eRehab system by Melissa M. Bowie, M.A., CCC/SLP, PPS Coordinator.  Information including medical coding, functional ability and quality indicators will be reviewed and updated through discharge.    

## 2021-02-13 NOTE — Progress Notes (Signed)
Inpatient Rehabilitation Care Coordinator Assessment and Plan Patient Details  Name: Matthew Wise MRN: [id number] Date of Birth: [birth year]  Today's Date: 02/13/2021  Hospital Problems: Principal Problem:   Right middle cerebral artery stroke [facility])  Past Medical History:  Past Medical History:  Diagnosis Date  . Acute CVA (cerebrovascular accident) ([place]) 09/09/2020  . Chronic anticoagulation 11/14/2014  . CKD (chronic kidney disease) stage 3, GFR 30-59 ml/min (HCC) 10/24/2019  . CKD (chronic kidney disease) stage 3, GFR 30-59 ml/min (HCC) 10/24/2019  . Clotting disorder ([place])   . 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 DVT (deep venous thrombosis) ([place]) 06/15/2011  . DVT of leg (deep venous thrombosis) (HCC)    rt. leg  . Essential hypertension 11/28/2020  . H/O PE (pulmonary embolism) 06/15/2011  . Hyperlipidemia 11/28/2020  . 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 Prediabetes 10/24/2019  . Thyroid nodule 01/25/2021   Past Surgical History:  Past Surgical History:  Procedure Laterality Date  . COLONOSCOPY WITH PROPOFOL N/A 10/29/2015   incomplete due to redundant colon. two simple tubular adenomas removed. patient was advised to go to [facility] for colonoscopy but he did not keep appt.  . IR CT HEAD LTD  02/02/2021  . IR PERCUTANEOUS ART THROMBECTOMY/INFUSION INTRACRANIAL INC DIAG ANGIO  02/02/2021  . RADIOLOGY WITH ANESTHESIA N/A 02/02/2021   Procedure: IR WITH ANESTHESIA;  Surgeon: Luanne Bras, MD;  Location: [place];  Service: Radiology;  Laterality: N/A;  . TEE WITHOUT CARDIOVERSION N/A 09/10/2020   Procedure: TRANSESOPHAGEAL ECHOCARDIOGRAM (TEE) WITH PROPOFOL;  Surgeon: Satira Sark, MD;  Location: [facility];  Service: Cardiovascular;  Laterality: N/A;   Social History:  reports that he has been smoking cigarettes. He started smoking about 37 years ago. He has a 30.00  pack-year smoking history. He has never used smokeless tobacco. He reports previous alcohol use of about 3.0 standard drinks of alcohol per week. He reports previous drug use.  Family / Support Systems Marital Status: Single Spouse/Significant Other: Paige Children: n/a Other Supports: Deneise Lever (SisteR), Theodoro Kalata (Niece), Arby Barrette (girlfriend) Anticipated Caregiver: Sister, girlfriend, Niece, other family Ability/Limitations of Caregiver: Arby Barrette works during day, no physical assistance Caregiver Availability: 24/7 Family Dynamics: Patient lives alone has family support from: sister, girlfriend and niece. Sister primary contact  Social History Preferred language: English Religion: Non-Denominational Education: 12 years Read: Yes Write: Yes Employment Status: Disabled Date Retired/Disabled/Unemployed: 10 years Public relations account executive Issues: n/a Guardian/Conservator: Fotios Amos (sister)  [phone]   Abuse/Neglect Abuse/Neglect Assessment Can Be Completed: Yes Physical Abuse: Denies Verbal Abuse: Denies Sexual Abuse: Denies Exploitation of patient/patient's resources: Denies Self-Neglect: Denies  Emotional Status Pt's affect, behavior and adjustment status: Coping, flat Recent Psychosocial Issues: n/a Psychiatric History: n/a Substance Abuse History: Tobacco  Patient / Family Perceptions, Expectations & Goals Pt/Family understanding of illness & functional limitations: yes Premorbid pt/family roles/activities: Previosuly independent. D/c home W/ AD Anticipated changes in roles/activities/participation: Some assistance/supervision Pt/family expectations/goals: Supervision/Min A  [employer]: None Premorbid Home Care/DME Agencies: Other (Comment) ([facility], [employer], Civil engineer, contracting) Transportation available at discharge: Family able to [employer] referrals recommended: Neuropsychology (Coping/Substance Use)  Discharge Planning Living  Arrangements: Alone Support Systems: Spouse/significant other,Other relatives (Sister, Photographer, Girlfriend) Type of Residence: Private residence (1 level home: 2 steps to enter) Administrator, sports: Multimedia programmer (specify) ([employer] Medicare) Museum/gallery curator Resources: [employer] Screen Referred: No Living Expenses: Education officer, community Management: Patient  Does the patient have any problems obtaining your medications?: No Home Management: Previosuly Independent Patient/Family Preliminary Plans: Some assistance/supervision Care Coordinator Anticipated Follow Up Needs: HH/OP DC Planning Additional Notes/Comments: Dy3, thin Expected length of stay: 14-18 Days  Clinical Impression SW met patient and sister Asa Lente) introduce self, explain role and process. Left VM with sister Deneise Lever. Patient plans to d/c home with family to provide care. Family unable to provide much physical assistance. Patient discharging to a 1 level home, 2 steps to enter. SW will continue to follow up with questions and concerns.   Dyanne Iha 02/13/2021, 1:43 PM

## 2021-02-13 NOTE — Progress Notes (Signed)
[facility] Individual Statement of Services  Patient Name:  Matthew Wise  Date:  02/13/2021  Welcome to the [facility].  Our goal is to provide you with an individualized program based on your diagnosis and situation, designed to meet your specific needs.  With this comprehensive rehabilitation program, you will be expected to participate in at least 3 hours of rehabilitation therapies Monday-Friday, with modified therapy programming on the weekends.  Your rehabilitation program will include the following services:  Physical Therapy (PT), Occupational Therapy (OT), Speech Therapy (ST), 24 hour per day rehabilitation nursing, Therapeutic Recreaction (TR), Neuropsychology, Care Coordinator, Rehabilitation Medicine, Nutrition Services, Pharmacy Services and Other  Weekly team conferences will be held on Wednesdays to discuss your progress.  Your Inpatient Rehabilitation Care Coordinator will talk with you frequently to get your input and to update you on team discussions.  Team conferences with you and your family in attendance may also be held.  Expected length of stay: 14-18 Days  Overall anticipated outcome: MinA/Supervision  Depending on your progress and recovery, your program may change. Your Inpatient Rehabilitation Care Coordinator will coordinate services and will keep you informed of any changes. Your Inpatient Rehabilitation Care Coordinator's name and contact numbers are listed  below.  The following services may also be recommended but are not provided by the [facility]:    [place] will be made to provide these services after discharge if needed.  Arrangements include referral to agencies that provide these services.  Your insurance has been verified to be:  [employer] Your primary doctor is:  Hurshel Party,  MD  Pertinent information will be shared with your doctor and your insurance company.  Inpatient Rehabilitation Care Coordinator:  [name], [place] or [phone]  Information discussed with and copy given to patient by: Dyanne Iha, 02/13/2021, 12:29 PM

## 2021-02-13 NOTE — Evaluation (Signed)
Physical Therapy Assessment and Plan  Patient Details  Name: Matthew Wise MRN: [id number] Date of Birth: 12-17-1960  PT Diagnosis: Abnormal posture, Abnormality of gait, Cognitive deficits, Difficulty walking, Hemiplegia non-dominant, Hypertonia, Impaired cognition, Impaired sensation and Muscle weakness Rehab Potential: Good ELOS: 3.5 - 4 weeks   Today's Date: 02/13/2021 PT Individual Time: 0807-0907 PT Individual Time Calculation (min): 60 min    Hospital Problem: Principal Problem:   Right middle cerebral artery stroke [facility])   Past Medical History:  Past Medical History:  Diagnosis Date  . Acute CVA (cerebrovascular accident) ([place]) 09/09/2020  . Chronic anticoagulation 11/14/2014  . CKD (chronic kidney disease) stage 3, GFR 30-59 ml/min (HCC) 10/24/2019  . CKD (chronic kidney disease) stage 3, GFR 30-59 ml/min (HCC) 10/24/2019  . Clotting disorder ([place])   . 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 DVT (deep venous thrombosis) ([place]) 06/15/2011  . DVT of leg (deep venous thrombosis) (HCC)    rt. leg  . Essential hypertension 11/28/2020  . H/O PE (pulmonary embolism) 06/15/2011  . Hyperlipidemia 11/28/2020  . 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 Prediabetes 10/24/2019  . Thyroid nodule 01/25/2021   Past Surgical History:  Past Surgical History:  Procedure Laterality Date  . COLONOSCOPY WITH PROPOFOL N/A 10/29/2015   incomplete due to redundant colon. two simple tubular adenomas removed. patient was advised to go to [facility] for colonoscopy but he did not keep appt.  . IR CT HEAD LTD  02/02/2021  . IR PERCUTANEOUS ART THROMBECTOMY/INFUSION INTRACRANIAL INC DIAG ANGIO  02/02/2021  . RADIOLOGY WITH ANESTHESIA N/A 02/02/2021   Procedure: IR WITH ANESTHESIA;  Surgeon: Luanne Bras, MD;  Location: [facility];  Service: Radiology;  Laterality: N/A;  . TEE WITHOUT CARDIOVERSION N/A  09/10/2020   Procedure: TRANSESOPHAGEAL ECHOCARDIOGRAM (TEE) WITH PROPOFOL;  Surgeon: Satira Sark, MD;  Location: [facility];  Service: Cardiovascular;  Laterality: N/A;    Assessment & Plan Clinical Impression: Patient is a 60 y.o. right-handed male with history of pulmonary emboli maintained on Eliquis, hypertension, CKD stage III, hyperlipidemia, prediabetes, sickle cell disease, tobacco abuse as well as right MCA infarction recently admitted 01/21/2021 to 01/27/2021 and discharged home ambulating 150 feet rolling walker.  History taken from chart review and patient.  Patient lives alone.  1 level home 3 steps to entry.  Independent with assistive device.  He has a sister who checks on him regularly.  He presented on 02/03/2021 with left hemiparesis, subacute onset.  Crania CT scan unremarkable for acute intracranial process.  Suspect acute infarct of the right frontal parietal lobe posterior superior to chronic infarct with probable involvement of lateral precentral and postcentral gyri.  Patient did not receive TPA.  CT angiogram of head and neck showed abrupt occlusion of the right M1 segment new as compared to prior CTA of 01/25/2021.  Acute core infarct of the posterior right frontal parietal region largely corresponding with a completed infarct as seen on prior MRI.  EEG evidence of focal seizure arising from the right posterior quadrant during which patient was noted to have left upper extremity twitching.  Additionally there was cortical dysfunction in the right frontal temporal region likely due to underlying stroke.  Most recent echocardiogram with ejection fraction 55 to 60%.  Admission chemistries unremarkable except creatinine 1.83, urine drug screen negative, hemoglobin A1c 5.9.  Alcohol negative.  Patient underwent arteriogram with revascularization of right MCA  M1 segment per interventional radiology.  Latest follow-up MRI showed large acute right MCA territory infarct with petechial hemorrhage  and minimal leftward midline shift as well as small acute left cerebellar infarct new small acute early subacute infarct in left occipital left parietal left frontal lobes.  Bilateral extremity Dopplers findings consistent with age-indeterminate DVT involving the right common femoral vein and right femoral vein as well as left femoral vein and left popliteal vein, left common femoral and SFA junction.  Patient was placed on intravenous heparin remained on low-dose aspirin and Pradaxa was added.  He had been loaded for Keppra for his suspected seizure.  Hospital course further complicated by dysphagia, started on Dysphagia #3 thin liquid diet.  Therapy evaluations completed patient was admitted due to left-sided hemiparesis for a comprehensive rehab program. Patient transferred to CIR on 02/12/2021 .   Patient currently requires +2 max assist with mobility secondary to muscle weakness, muscle joint tightness and muscle paralysis, decreased cardiorespiratoy endurance, impaired timing and sequencing, abnormal tone, unbalanced muscle activation, decreased coordination and decreased motor planning, field cut, decreased midline orientation and decreased attention to left, decreased initiation, decreased attention, decreased awareness, decreased problem solving, decreased safety awareness, decreased memory and delayed processing and decreased sitting balance, decreased standing balance, decreased postural control, hemiplegia and decreased balance strategies.  Prior to hospitalization, patient was modified independent  with mobility and lived with Alone in a House.  Home access is 3Stairs to enter.  Patient will benefit from skilled PT intervention to maximize safe functional mobility, minimize fall risk and decrease caregiver burden for planned discharge home with 24 hour assist.  Anticipate patient will benefit from follow up [facility] at discharge.  PT - End of Session Activity Tolerance: Tolerates 30+ min activity with  multiple rests Endurance Deficit: Yes Endurance Deficit Description: requires seated rest break PT Assessment Rehab Potential (ACUTE/IP ONLY): Good PT Barriers to Discharge: [place] home environment;Decreased caregiver support;Home environment access/layout;Lack of/limited family support;Other (comments) PT Barriers to Discharge Comments: L inattention PT Patient demonstrates impairments in the following area(s): Balance;Perception;Behavior;Safety;Edema;Sensory;Endurance;Skin Integrity;Motor;Nutrition;Pain PT Transfers Functional Problem(s): Bed Mobility;Bed to Chair;Car;Furniture PT Locomotion Functional Problem(s): Ambulation;Wheelchair Mobility;Stairs PT Plan PT Intensity: Minimum of 1-2 x/day ,45 to 90 minutes PT Frequency: 5 out of 7 days PT Duration Estimated Length of Stay: 3.5 - 4 weeks PT Treatment/Interventions: Ambulation/gait training;Community reintegration;DME/adaptive equipment instruction;Neuromuscular re-education;Psychosocial support;Stair training;UE/LE Strength taining/ROM;Wheelchair propulsion/positioning;Balance/vestibular training;Discharge planning;Functional electrical stimulation;Pain management;Therapeutic Activities;Skin care/wound management;UE/LE Coordination activities;Cognitive remediation/compensation;Disease management/prevention;Functional mobility training;Splinting/orthotics;Patient/family education;Therapeutic Exercise;Visual/perceptual remediation/compensation PT Transfers Anticipated Outcome(s): min assist PT Locomotion Anticipated Outcome(s): min assist with LRAD PT Recommendation Recommendations for Other Services: Neuropsych consult Follow Up Recommendations: Home health PT;24 hour supervision/assistance Patient destination: Home Equipment Recommended: To be determined   PT Evaluation Precautions/Restrictions Precautions Precautions: Fall;Other (comment) Precaution Comments: L hemiplegia, left neglect, hx of seizures, age indeterminate B LE  DVTs Restrictions Weight Bearing Restrictions: No Pain Pain Assessment Pain Scale: 0-10 Pain Score: 0-No pain Home Living/Prior Functioning Home Living Living Arrangements: Alone Available Help at Discharge: Family;Available PRN/intermittently (has 4 sisters that live nearby and are retired) Type of Home: House Home Access: Stairs to enter Technical brewer of Steps: 3 Entrance Stairs-Rails: Right;Left;Can reach both [place]: One level Bathroom Shower/Tub: Tub/shower unit;Walk-in shower Bathroom Toilet: Standard Bathroom Accessibility: Yes  Lives With: Alone Prior Function Level of Independence: Independent with gait;Independent with transfers;Requires assistive device for independence  Able to Take Stairs?: Yes Driving: No Comments: used either RW or cane in home at mod-I level for functional  mobility following recent D/C from hospital Perception  Perception Perception: Impaired Inattention/Neglect: Does not attend to left visual field;Does not attend to left side of body Spatial Orientation: poor midline orientation Praxis Praxis: Impaired Praxis Impairment Details: Initiation;Motor planning  Cognition Overall Cognitive Status: Impaired/Different from baseline Arousal/Alertness: Awake/alert Orientation Level: Oriented X4 Attention: Focused Focused Attention: Appears intact Awareness: Impaired Problem Solving: Impaired Safety/Judgment: Impaired Sensation Sensation Light Touch: Impaired Detail Light Touch Impaired Details: Impaired LUE;Impaired LLE Hot/Cold: Not tested Proprioception: Impaired Detail Proprioception Impaired Details: Impaired LLE Stereognosis: Not tested Coordination Gross Motor Movements are Fluid and Coordinated: No Fine Motor Movements are Fluid and Coordinated: No Coordination and Movement Description: impaired due to L hemiplegia (UE more impaired than LE) with left inattention and impaired midline orientation Heel Shin Test:  impaired on L due to paresis Motor  Motor Motor: Hemiplegia;Abnormal postural alignment and control Motor - Skilled Clinical Observations: Moderate LUE and LLE hemiparesis   Trunk/Postural Assessment  Cervical Assessment Cervical Assessment: Exceptions to [facility] (slight R cervical rotation) Thoracic Assessment Thoracic Assessment: Exceptions to [facility] (thoracic rounding) Lumbar Assessment Lumbar Assessment: Exceptions to [facility] (posterior pelvic tilt in sitting) Postural Control Postural Control: Deficits on evaluation Trunk Control: impaired with repeated L lean/LOB Righting Reactions: delayed  Balance Balance Balance Assessed: Yes Static Sitting Balance Static Sitting - Balance Support: Feet supported Static Sitting - Level of Assistance: 3: Mod assist Static Sitting - Comment/# of Minutes: repeated L lateral lean/LOB Dynamic Sitting Balance Dynamic Sitting - Level of Assistance: 2: Max assist Static Standing Balance Static Standing - Level of Assistance: 1: +2 Total assist Dynamic Standing Balance Dynamic Standing - Level of Assistance: 1: +2 Total assist Extremity Assessment      RLE Assessment RLE Assessment: Exceptions to [facility] Active Range of Motion (AROM) Comments: WFL RLE Strength Right Hip Flexion: 4+/5 Right Knee Flexion: 4/5 Right Knee Extension: 4+/5 Right Ankle Dorsiflexion: 4/5 Right Ankle Plantar Flexion: 4/5 LLE Assessment LLE Assessment: Exceptions to [facility] Passive Range of Motion (PROM) Comments: WFL except tightness noted moving into ankle DF LLE Strength Left Hip Flexion: 2-/5 Left Knee Flexion: 2-/5 Left Knee Extension: 2/5 Left Ankle Dorsiflexion: 0/5 Left Ankle Plantar Flexion: 2-/5  Care Tool Care Tool Bed Mobility Roll left and right activity   Roll left and right assist level: Moderate Assistance - Patient 50 - 74%    Sit to lying activity   Sit to lying assist level: Maximal Assistance - Patient 25 - 49%    Lying to sitting edge of bed  activity   Lying to sitting edge of bed assist level: Maximal Assistance - Patient 25 - 49%     Care Tool Transfers Sit to stand transfer   Sit to stand assist level: 2 Helpers    Chair/bed transfer   Chair/bed transfer assist level: Maximal Assistance - Patient 25 - 49%     Psychologist, counselling transfer activity did not occur: Safety/medical concerns        Care Tool Locomotion Ambulation Ambulation activity did not occur: Safety/medical concerns        Walk 10 feet activity Walk 10 feet activity did not occur: Safety/medical concerns       Walk 50 feet with 2 turns activity Walk 50 feet with 2 turns activity did not occur: Safety/medical concerns      Walk 150 feet activity Walk 150 feet activity did not occur: Safety/medical concerns      Walk 10  feet on uneven surfaces activity Walk 10 feet on uneven surfaces activity did not occur: Safety/medical concerns      Stairs Stair activity did not occur: Safety/medical concerns        Walk up/down 1 step activity Walk up/down 1 step or curb (drop down) activity did not occur: Safety/medical concerns     Walk up/down 4 steps activity did not occuR: Safety/medical concerns  Walk up/down 4 steps activity      Walk up/down 12 steps activity Walk up/down 12 steps activity did not occur: Safety/medical concerns      Pick up small objects from floor Pick up small object from the floor (from standing position) activity did not occur: Safety/medical concerns      Wheelchair Will patient use wheelchair at discharge?:  (TBD)          Wheel 50 feet with 2 turns activity      Wheel 150 feet activity        Refer to Care Plan for Long Term Goals  SHORT TERM GOAL WEEK 1 PT Short Term Goal 1 (Week 1): Pt will perform supine<>sit with mod assist PT Short Term Goal 2 (Week 1): Pt will perform bed<>chair transfers with mod assist PT Short Term Goal 3 (Week 1): Pt will perform sit<>stands using LRAD  with +2 mod assist PT Short Term Goal 4 (Week 1): Pt will initiate gait training  Recommendations for other services: Neuropsych  Skilled Therapeutic Intervention Pt received supine in bed and agreeable to therapy session. Evaluation completed (see details above) with patient education regarding purpose of PT evaluation, PT POC and goals, therapy schedule, weekly team meetings, and other CIR information including safety plan and fall risk safety. Pt demonstrates L inattention throughout session. Therapist retrieved TIS w/c with cushion to promote increased, upright OOB activity tolerance while providing method for pressure relief. Supine>sitting L EOB, HOB flat and not using bedrail, with max assist for trunk upright and max cuing for B LE management. Sitting EOB donned gown with max assist while pt demonstrates repeated L lateral gradual trunk lean and LOB with poor awareness of lean and delayed righting reactions - cuing for improvement and increased midline orientation. Sit<>stand to/from EOB with +2 max assist for lifting and balance - demos significant R weight shift compensation with minimal L LE WBing - with 3 [place]eer support performed pre-gait training via R LE forward/backwards stepping 2x while guarding L knee and then L LE forward/backward stepping 1x with assist for the backwards step. R squat pivot EOB>w/c max assist for lifting/pivoting hips. MD in/out for morning assessment. Therapist donned towel on L w/c leg rest for pressure relief on lateral portion of lower leg. Pt left tilted back in TIS w/c with needs in reach and seat belt alarm on.  Mobility Bed Mobility Bed Mobility: Supine to Sit;Sit to Supine Supine to Sit: Maximal Assistance - Patient - Patient 25-49% Sit to Supine: Maximal Assistance - Patient 25-49% Transfers Transfers: Sit to Stand;Stand to Sit;Squat Pivot Transfers Sit to Stand: 2 Helpers;Moderate Assistance - Patient 50-74% Stand to Sit: 2 Helpers;Moderate  Assistance - Patient 50-74% Stand Pivot Transfers: Total Assistance - Patient < 25% Squat Pivot Transfers: Maximal Assistance - Patient 25-49% Transfer (Assistive device): None Locomotion  Gait Ambulation: No (only performed pre-gait training) Wheelchair Mobility Wheelchair Mobility: No (currently using TIS w/c due to impaired trunk control)   Discharge Criteria: Patient will be discharged from PT if patient refuses treatment 3 consecutive times without medical reason, if  treatment goals not met, if there is a change in medical status, if patient makes no progress towards goals or if patient is discharged from hospital.  The above assessment, treatment plan, treatment alternatives and goals were discussed and mutually agreed upon: by patient  Tawana Scale , PT, DPT, CSRS  02/13/2021, 7:44 AM

## 2021-02-13 NOTE — Evaluation (Signed)
Speech Language Pathology Assessment and Plan  Patient Details  Name: Matthew Wise MRN: [id number] Date of Birth: 1961/02/17  SLP Diagnosis: Dysphagia;Cognitive Impairments  Rehab Potential: Good ELOS: 3.5-4 weeks    Today's Date: 02/13/2021 SLP Individual Time: 5009-3818 SLP Individual Time Calculation (min): 55 min   Hospital Problem: Principal Problem:   Right middle cerebral artery stroke [facility])  Past Medical History:  Past Medical History:  Diagnosis Date  . Acute CVA (cerebrovascular accident) ([place]) 09/09/2020  . Chronic anticoagulation 11/14/2014  . CKD (chronic kidney disease) stage 3, GFR 30-59 ml/min (HCC) 10/24/2019  . CKD (chronic kidney disease) stage 3, GFR 30-59 ml/min (HCC) 10/24/2019  . Clotting disorder ([place])   . 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 DVT (deep venous thrombosis) ([place]) 06/15/2011  . DVT of leg (deep venous thrombosis) (HCC)    rt. leg  . Essential hypertension 11/28/2020  . H/O PE (pulmonary embolism) 06/15/2011  . Hyperlipidemia 11/28/2020  . 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 Prediabetes 10/24/2019  . Thyroid nodule 01/25/2021   Past Surgical History:  Past Surgical History:  Procedure Laterality Date  . COLONOSCOPY WITH PROPOFOL N/A 10/29/2015   incomplete due to redundant colon. two simple tubular adenomas removed. patient was advised to go to [facility] for colonoscopy but he did not keep appt.  . IR CT HEAD LTD  02/02/2021  . IR PERCUTANEOUS ART THROMBECTOMY/INFUSION INTRACRANIAL INC DIAG ANGIO  02/02/2021  . RADIOLOGY WITH ANESTHESIA N/A 02/02/2021   Procedure: IR WITH ANESTHESIA;  Surgeon: Luanne Bras, MD;  Location: [place];  Service: Radiology;  Laterality: N/A;  . TEE WITHOUT CARDIOVERSION N/A 09/10/2020   Procedure: TRANSESOPHAGEAL ECHOCARDIOGRAM (TEE) WITH PROPOFOL;  Surgeon: Satira Sark, MD;  Location: [facility];  Service:  Cardiovascular;  Laterality: N/A;    Assessment / Plan / Recommendation Clinical Impression   HPI: Matthew Wise is a 60 year old right-handed male with history of pulmonary emboli maintained on Eliquis, hypertension, CKD stage III, hyperlipidemia, prediabetes, sickle cell disease, tobacco abuse as well as right MCA infarction recently admitted 01/21/2021 to 01/27/2021 and discharged home ambulating 150 feet rolling walker.  History taken from chart review and patient.  Patient lives alone.  1 level home 3 steps to entry.  Independent with assistive device.  He has a sister who checks on him regularly.  He presented on 02/03/2021 with left hemiparesis, subacute onset.  Crania CT scan unremarkable for acute intracranial process.  Suspect acute infarct of the right frontal parietal lobe posterior superior to chronic infarct with probable involvement of lateral precentral and postcentral gyri.  Patient did not receive TPA.  CT angiogram of head and neck showed abrupt occlusion of the right M1 segment new as compared to prior CTA of 01/25/2021.  Acute core infarct of the posterior right frontal parietal region largely corresponding with a completed infarct as seen on prior MRI.  EEG evidence of focal seizure arising from the right posterior quadrant during which patient was noted to have left upper extremity twitching.  Additionally there was cortical dysfunction in the right frontal temporal region likely due to underlying stroke.  Most recent echocardiogram with ejection fraction 55 to 60%.  Admission chemistries unremarkable except creatinine 1.83, urine drug screen negative, hemoglobin A1c 5.9.  Alcohol negative.  Patient underwent arteriogram with revascularization of right MCA M1 segment per interventional radiology.  Latest follow-up MRI showed large acute  right MCA territory infarct with petechial hemorrhage and minimal leftward midline shift as well as small acute left cerebellar infarct new small acute early  subacute infarct in left occipital left parietal left frontal lobes.  Bilateral extremity Dopplers findings consistent with age-indeterminate DVT involving the right common femoral vein and right femoral vein as well as left femoral vein and left popliteal vein, left common femoral and SFA junction.  Patient was placed on intravenous heparin remained on low-dose aspirin and Pradaxa was added.  He had been loaded for Keppra for his suspected seizure.  Hospital course further complicated by dysphagia, started on Dysphagia #3 thin liquid diet.  Therapy evaluations completed patient was admitted due to left-sided hemiparesis for a comprehensive rehab program 02/12/21 and ST evaluations were completed 02/13/21 with results as follows:  Bedside swallow evaluation: Pt presents with s/sx primarily oral dysphagia today. Trials of mechanical soft (Dys 3) and regular textured solids and thin liquids were provided. Pt exhibited mild left buccal pocketing of both mechanical soft and regular textures, slightly increased on quantity of pocketed PO with regular. Although he acknowledged that food sometimes "gets stuck" in the left side of cheek, he did not recall any compensatory strategies without cues. Once cued he was successful in clearing buccal pocketing. No overt s/sx aspiration observed across liquids or solids. Recommend pt continue with current diet (dysphaiga 3/thin liquids/medications whole with water) with full supervision to ensure use of safe and compensatory swallow strategies.   Cognitive-linguistic evaluation: Pt presents with mild to moderate cognitive impairments primarily impacting his problem solving skills, emergent and anticipatory awareness, and short term memory. He also exhibited delayed processing and left visual inattention with a right gaze preference. He scored [facility] for all subtest on the Cognistat with the exception of visual construction (0 - severe impairment), short term memory (7 -  to moderate  impairments). His functional problem deficits were noted during self feeding and repositioning. Due to processing, suspect that pt's higher levels of attention (ex: alternating attention) may also be impaired, and will continue to assess in diagnostic treatment. His speech was 100% intelligible and expressive/receptive language skills were [facility].   Recommend pt receive skilled ST services to address dysphagia and cognitive deficits while inpatient in order to ensure diet safety and efficiency and maximize his functional safety and independence prior to discharge.   Skilled Therapeutic Interventions          Bedside swallow and cognitive-linguistic evaluations were administered and results were reviewed with pt (please see above for details regarding the results).   SLP Assessment  Patient will need skilled Speech Lanaguage Pathology Services during CIR admission    Recommendations  SLP Diet Recommendations: Dysphagia 3 (Mech soft);Thin Liquid Administration via: Straw;Cup Medication Administration: Whole meds with liquid Supervision: Patient able to self feed;Full supervision/cueing for compensatory strategies Compensations: Minimize environmental distractions Postural Changes and/or Swallow Maneuvers: Seated upright 90 degrees Oral Care Recommendations: Oral care BID Patient destination: Home Follow up Recommendations: Home Health SLP;24 hour supervision/assistance Equipment Recommended: None recommended by SLP    SLP Frequency 3 to 5 out of 7 days   SLP Duration  SLP Intensity  SLP Treatment/Interventions 3.5-4 weeks  Minumum of 1-2 x/day, 30 to 90 minutes  Cognitive remediation/compensation;Cueing hierarchy;Dysphagia/aspiration precaution training;Functional tasks;Patient/family education;Internal/external aids    Pain Pain Assessment Pain Scale: 0-10 Pain Score: 0-No pain     SLP Evaluation Cognition Overall Cognitive Status: Impaired/Different from  baseline Arousal/Alertness: Awake/alert Orientation Level: Oriented X4 Attention: Sustained Sustained Attention: Appears  intact Alternating Attention: Impaired Alternating Attention Impairment: Verbal basic;Functional basic Memory: Impaired Memory Impairment: Decreased short term memory;Decreased recall of new information Decreased Short Term Memory: Verbal basic;Functional basic Awareness: Impaired Awareness Impairment: Emergent impairment;Anticipatory impairment Problem Solving: Impaired Problem Solving Impairment: Functional basic;Verbal complex Executive Function: Self Monitoring;Self Correcting;Organizing Organizing: Impaired Organizing Impairment: Verbal complex;Functional complex Self Monitoring: Impaired Self Monitoring Impairment: Functional basic;Verbal complex Self Correcting: Impaired Self Correcting Impairment: Functional basic Safety/Judgment: Impaired  Comprehension Auditory Comprehension Overall Auditory Comprehension: Appears within functional limits for tasks assessed Conversation: Simple Visual Recognition/Discrimination Discrimination: Within Function Limits Reading Comprehension Reading Status: Not tested Expression Expression Primary Mode of Expression: Verbal Verbal Expression Overall Verbal Expression: Appears within functional limits for tasks assessed Written Expression Written Expression: Not tested Oral Motor Oral Motor/Sensory Function Overall Oral Motor/Sensory Function: Mild impairment Facial ROM: Reduced left Facial Symmetry: Abnormal symmetry left Facial Strength: Reduced left Facial Sensation: Reduced left Lingual ROM: Within Functional Limits Lingual Symmetry: Within Functional Limits Lingual Strength: Within Functional Limits Lingual Sensation: Within Functional Limits Velum: Within Functional Limits Motor Speech Overall Motor Speech: Appears within functional limits for tasks assessed Intelligibility: Intelligible Motor  Planning: Witnin functional limits Motor Speech Errors: Not applicable  Care Tool Care Tool Cognition Expression of Ideas and Wants Expression of Ideas and Wants: Some difficulty - exhibits some difficulty with expressing needs and ideas (e.g, some words or finishing thoughts) or speech is not clear   Understanding Verbal and Non-Verbal Content Understanding Verbal and Non-Verbal Content: Usually understands - understands most conversations, but misses some part/intent of message. Requires cues at times to understand   Memory/Recall Ability *first 3 days only Memory/Recall Ability *first 3 days only: Current season;That he or she is in a hospital/hospital unit     Intelligibility: Intelligible  Bedside Swallowing Assessment General Date of Onset: 01/26/21 Previous Swallow Assessment: BSE 01/26/21 Temperature Spikes Noted: No Respiratory Status: Room air History of Recent Intubation: No Behavior/Cognition: Alert;Cooperative Oral Cavity - Dentition: Adequate natural dentition Self-Feeding Abilities: Able to feed self Vision: Functional for self-feeding Patient Positioning: Upright in chair/Tumbleform Baseline Vocal Quality: Normal Volitional Cough: Strong Volitional Swallow: Able to elicit  Oral Care Assessment Does patient have any of the following "high(er) risk" factors?: None of the above Does patient have any of the following "at risk" factors?: Other - dysphagia Patient is AT RISK: Order set for Adult Oral Care Protocol initiated -  "At Risk Patients" option selected (see row information) Patient is LOW RISK: Follow universal precautions (see row information) Ice Chips Ice chips: Not tested Thin Liquid Thin Liquid: Impaired Presentation: Cup;Self Fed;Straw Oral Phase Impairments: Reduced labial seal Nectar Thick Nectar Thick Liquid: Not tested Honey Thick Honey Thick Liquid: Not tested Puree Puree: Not tested Solid Solid: Impaired Presentation: Self Fed Oral Phase  Impairments: Reduced labial seal Oral Phase Functional Implications: Oral residue;Left anterior spillage;Left lateral sulci pocketing BSE Assessment Risk for Aspiration Impact on safety and function: Mild aspiration risk Other Related Risk Factors: Cognitive impairment  Short Term Goals: Week 1: SLP Short Term Goal 1 (Week 1): Pt will demonsrate ability to use compensatory strategies to clear left buccal poceting and minimize left anterior loss of POs with Supervision A verbal cues. SLP Short Term Goal 2 (Week 1): Pt will demonstrate efficient mastication and oral clearance of regular textures a minimum of 2 sessions and no more than Min A cues required for oral clearance prior to advancement. SLP Short Term Goal 3 (Week 1): Pt will demonstrate ability to problem solve  basic to mildly complex tasks with Mod A verbal/visual cuess. SLP Short Term Goal 4 (Week 1): Pt will recall new and/or daily information with Mod A cues. SLP Short Term Goal 5 (Week 1): Pt will attend to left side of his body and/or locate items to left of midline with Moderate cueing. SLP Short Term Goal 6 (Week 1): Pt will detect functional errors with Min A cues.  Refer to Care Plan for Long Term Goals  Recommendations for other services: Neuropsych  Discharge Criteria: Patient will be discharged from SLP if patient refuses treatment 3 consecutive times without medical reason, if treatment goals not met, if there is a change in medical status, if patient makes no progress towards goals or if patient is discharged from hospital.  The above assessment, treatment plan, treatment alternatives and goals were discussed and mutually agreed upon: by patient  Arbutus Leas 02/13/2021, 12:21 PM

## 2021-02-13 NOTE — Progress Notes (Signed)
PROGRESS NOTE   Subjective/Complaints:  No issues overnite, no calf pain  ROS- no pains, - SOB, + constipation   Objective:   No results found. Recent Labs    02/12/21 0356 02/13/21 0535  WBC 5.6 5.6  HGB 13.1 14.5  HCT 37.1* 40.7  PLT 240 271   Recent Labs    02/11/21 0240 02/13/21 0535  NA 135 135  K 3.1* 3.7  CL 101 100  CO2 25 24  GLUCOSE 94 96  BUN 9 10  CREATININE 1.31* 1.41*  CALCIUM 9.5 10.1    Intake/Output Summary (Last 24 hours) at 02/13/2021 0847 Last data filed at 02/13/2021 0816 Gross per 24 hour  Intake 360 ml  Output 500 ml  Net -140 ml     Pressure Injury 02/12/21 Penis Mid Stage 2 -  Partial thickness loss of dermis presenting as a shallow open injury with a red, pink wound bed without slough. Pink granulation area from pressure of condom cath that was too small. (Active)  02/12/21 1454  Location: Penis  Location Orientation: Mid  Staging: Stage 2 -  Partial thickness loss of dermis presenting as a shallow open injury with a red, pink wound bed without slough.  Wound Description (Comments): Pink granulation area from pressure of condom cath that was too small.  Present on Admission: Yes    Physical Exam: Vital Signs Blood pressure 134/85, pulse 63, temperature 98.6 F (37 C), temperature source Oral, resp. rate 17, height 6' (1.829 m), weight 117.6 kg.  General: No acute distress Mood and affect are appropriate Heart: Regular rate and rhythm no rubs murmurs or extra sounds Lungs: Clear to auscultation, breathing unlabored, no rales or wheezes Abdomen: Positive bowel sounds, soft nontender to palpation, nondistended Extremities: venous stasis ulceration R>L medial distal thigh , thrombosed saphenous vein on Left , bilateral calf swelling  Skin: No evidence of breakdown, no evidence of rash Neurologic: Cranial nerves II through XII intact, motor strength is 5/5 in Right deltoid,  bicep, tricep, grip, hip flexor, knee extensors, ankle dorsiflexor and plantar flexor Sensory exam reduced sensation to LT LUE and LLE , Musculoskeletal: Full range of motion in all 4 extremities. No joint swelling    Assessment/Plan: 1. Functional deficits which require 3+ hours per day of interdisciplinary therapy in a comprehensive inpatient rehab setting.  Physiatrist is providing close team supervision and 24 hour management of active medical problems listed below.  Physiatrist and rehab team continue to assess barriers to discharge/monitor patient progress toward functional and medical goals  Care Tool:  Bathing              Bathing assist       Upper Body Dressing/Undressing Upper body dressing        Upper body assist      Lower Body Dressing/Undressing Lower body dressing            Lower body assist       Toileting Toileting    Toileting assist Assist for toileting: Moderate Assistance - Patient 50 - 74%     Transfers Chair/bed transfer  Transfers assist           Locomotion  Ambulation   Ambulation assist              Walk 10 feet activity   Assist           Walk 50 feet activity   Assist           Walk 150 feet activity   Assist           Walk 10 feet on uneven surface  activity   Assist           Wheelchair     Assist               Wheelchair 50 feet with 2 turns activity    Assist            Wheelchair 150 feet activity     Assist          Blood pressure 134/85, pulse 63, temperature 98.6 F (37 C), temperature source Oral, resp. rate 17, height 6' (1.829 m), weight 117.6 kg.   Medical Problem List and Plan: 1.  Left-sided hemiparesis secondary to right MCA infarct likely atheroembolic status post thrombectomy as well as posterior frontoparietal region infarct              -patient may shower             -ELOS/Goals: 14-16 days/Supervision/Min A              Admit to CIR 2.  Antithrombotics: -DVT/anticoagulation: Bilateral lower extremity DVT/PE in the setting of hypercoagulopathy disorder.  Continue Pradaxa.             -antiplatelet therapy: Aspirin 81 mg daily 3. Pain Management: Tylenol as needed 4. Mood: Provide emotional support             -antipsychotic agents: N/A 5. Neuropsych: This patient is capable of making decisions on his own behalf. 6. Skin/Wound Care: Routine skin checks 7. Fluids/Electrolytes/Nutrition: Routine in and outs             CMP ordered 8.  Seizure disorder.  Keppra 500 mg twice daily 9.  Hypertension.  Lisinopril 40 mg daily, hydralazine 75 mg every 8 hours, Norvasc 10 mg daily.      Vitals:   02/12/21 1945 02/13/21 0655  BP: (!) 154/86 134/85  Pulse: 61 63  Resp: 18 17  Temp: 98.7 F (37.1 C) 98.6 F (37 C)  controlled 3/17 10.  Hypothyroidism.  Synthroid 11.  CKD stage III.  Baseline creatinine 1.83.  Improved 1.31 on 3/17             CMP ordered for tomorrow 12.  History of tobacco abuse.  Provide counseling 13.  Hyperlipidemia: Lipitor    LOS: 1 days A FACE TO FACE EVALUATION WAS PERFORMED  Charlett Blake 02/13/2021, 8:47 AM

## 2021-02-13 NOTE — Evaluation (Signed)
Occupational Therapy Assessment and Plan  Patient Details  Name: Matthew Wise MRN: [id number] Date of Birth: [birth year]  OT Diagnosis: abnormal posture, disturbance of vision, hemiplegia affecting non-dominant side and muscular wasting and disuse atrophy Rehab Potential: Rehab Potential (ACUTE ONLY): Good ELOS: 24-27 days   Today's Date: 02/13/2021 OT Individual Time: 1046-1200 OT Individual Time Calculation (min): 74 min     Hospital Problem: Principal Problem:   Right middle cerebral artery stroke [facility])   Past Medical History:  Past Medical History:  Diagnosis Date  . Acute CVA (cerebrovascular accident) ([place]) 09/09/2020  . Chronic anticoagulation 11/14/2014  . CKD (chronic kidney disease) stage 3, GFR 30-59 ml/min (HCC) 10/24/2019  . CKD (chronic kidney disease) stage 3, GFR 30-59 ml/min (HCC) 10/24/2019  . Clotting disorder ([place])   . 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 DVT (deep venous thrombosis) ([place]) 06/15/2011  . DVT of leg (deep venous thrombosis) (HCC)    rt. leg  . Essential hypertension 11/28/2020  . H/O PE (pulmonary embolism) 06/15/2011  . Hyperlipidemia 11/28/2020  . 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 Prediabetes 10/24/2019  . Thyroid nodule 01/25/2021   Past Surgical History:  Past Surgical History:  Procedure Laterality Date  . COLONOSCOPY WITH PROPOFOL N/A 10/29/2015   incomplete due to redundant colon. two simple tubular adenomas removed. patient was advised to go to [facility] for colonoscopy but he did not keep appt.  . IR CT HEAD LTD  02/02/2021  . IR PERCUTANEOUS ART THROMBECTOMY/INFUSION INTRACRANIAL INC DIAG ANGIO  02/02/2021  . RADIOLOGY WITH ANESTHESIA N/A 02/02/2021   Procedure: IR WITH ANESTHESIA;  Surgeon: Luanne Bras, MD;  Location: [facility];  Service: Radiology;  Laterality: N/A;  . TEE WITHOUT CARDIOVERSION N/A 09/10/2020   Procedure:  TRANSESOPHAGEAL ECHOCARDIOGRAM (TEE) WITH PROPOFOL;  Surgeon: Satira Sark, MD;  Location: [facility];  Service: Cardiovascular;  Laterality: N/A;    Assessment & Plan Clinical Impression: Patient is a 60 y.o. year old male with recent admission to the hospital on 02/03/2021 with left hemiparesis, subacute onset.  Crania CT scan unremarkable for acute intracranial process.  Suspect acute infarct of the right frontal parietal lobe posterior superior to chronic infarct with probable involvement of lateral precentral and postcentral gyri.  Patient did not receive TPA.  CT angiogram of head and neck showed abrupt occlusion of the right M1 segment new as compared to prior CTA of 01/25/2021.  Acute core infarct of the posterior right frontal parietal region largely corresponding with a completed infarct as seen on prior MRI.  EEG evidence of focal seizure arising from the right posterior quadrant during which patient was noted to have left upper extremity twitching.  Additionally there was cortical dysfunction in the right frontal temporal region likely due to underlying stroke.  Most recent echocardiogram with ejection fraction 55 to 60%.  Admission chemistries unremarkable except creatinine 1.83, urine drug screen negative, hemoglobin A1c 5.9.  Alcohol negative.  Patient underwent arteriogram with revascularization of right MCA M1 segment per interventional radiology.  Latest follow-up MRI showed large acute right MCA territory infarct with petechial hemorrhage and minimal leftward midline shift as well as small acute left cerebellar infarct new small acute early subacute infarct in left occipital left parietal left frontal lobes. Patient transferred to CIR on 02/12/2021 .    Patient currently requires total with basic self-care skills secondary to muscle weakness and muscle  paralysis, impaired timing and sequencing, abnormal tone, unbalanced muscle activation and decreased coordination, field cut and hemianopsia,  decreased attention to left and left side neglect, decreased initiation, decreased awareness, decreased problem solving and decreased memory and decreased sitting balance, decreased standing balance, decreased postural control, hemiplegia and decreased balance strategies.  Prior to hospitalization, patient could complete ADLs and IADLs with independent .  Patient will benefit from skilled intervention to decrease level of assist with basic self-care skills and increase independence with basic self-care skills prior to discharge home with care partner.  Anticipate patient will require minimal physical assistance and follow up home health.  OT - End of Session Activity Tolerance: Improving;Decreased this session Endurance Deficit: Yes Endurance Deficit Description: requires seated rest break OT Assessment Rehab Potential (ACUTE ONLY): Good OT Barriers to Discharge: Decreased caregiver support OT Barriers to Discharge Comments: Pt will need 24 hour supervision OT Patient demonstrates impairments in the following area(s): Balance;Cognition;Endurance;Motor;Vision;Sensory;Safety;Perception OT Basic ADL's Functional Problem(s): Eating;Grooming;Bathing;Dressing;Toileting OT Transfers Functional Problem(s): Toilet;Tub/Shower OT Additional Impairment(s): Fuctional Use of Upper Extremity OT Plan OT Intensity: Minimum of 1-2 x/day, 45 to 90 minutes OT Frequency: 5 out of 7 days OT Duration/Estimated Length of Stay: 24-27 days OT Treatment/Interventions: Balance/vestibular training;Discharge planning;Self Care/advanced ADL retraining;Therapeutic Activities;UE/LE Coordination activities;Cognitive remediation/compensation;Disease mangement/prevention;Functional mobility training;Patient/family education;Therapeutic Exercise;Visual/perceptual remediation/compensation;DME/adaptive equipment instruction;Neuromuscular re-education;UE/LE Strength taining/ROM;Wheelchair propulsion/positioning;Psychosocial  support;Splinting/orthotics OT Self Feeding Anticipated Outcome(s): supervision OT Basic Self-Care Anticipated Outcome(s): min assist OT Toileting Anticipated Outcome(s): min assist OT Bathroom Transfers Anticipated Outcome(s): min assist OT Recommendation Recommendations for Other Services: Therapeutic Recreation consult Therapeutic Recreation Interventions: Stress management Patient destination: Home Follow Up Recommendations: Home health OT;24 hour supervision/assistance Equipment Recommended: To be determined   OT Evaluation Precautions/Restrictions  Precautions Precautions: Fall Precaution Comments: L hemiplegia, left neglect Restrictions Weight Bearing Restrictions: No Pain Pain Assessment Pain Scale: 0-10 Pain Score: 0-No pain Home Living/Prior Functioning Home Living Family/patient expects to be discharged to:: Private residence Living Arrangements: Alone Available Help at Discharge: Family,Available 24 hours/day (has 4 sisters that will need to coordinate 24 hour supervision/assist) Type of Home: House Home Access: Stairs to enter [employer] of Steps: 2-3 Entrance Stairs-Rails: Right Home Layout: One level Bathroom Shower/Tub: Tub/shower Psychologist, occupational: Programmer, systems: Yes  Lives With: Alone IADL History Homemaking Responsibilities: Yes Meal Prep Responsibility: Primary Laundry Responsibility: Primary Cleaning Responsibility: Primary Occupation: On disability Type of Occupation: Worked in [place]: Independent with basic ADLs Vision Baseline Vision/History: Wears glasses Wears Glasses: Reading only Vision Assessment?: Yes Eye Alignment: Within Functional Limits Ocular Range of Motion: Within Functional Limits Alignment/Gaze Preference: Head turned (head turn to the right) Tracking/Visual Pursuits: Decreased smoothness of horizontal tracking;Decreased smoothness of  vertical tracking Convergence: Within functional limits Visual Fields: Left homonymous hemianopsia Perception  Perception: Impaired Inattention/Neglect: Does not attend to left side of body Praxis Praxis: Impaired Praxis Impairment Details: Initiation Cognition Overall Cognitive Status: Impaired/Different from baseline Arousal/Alertness: Awake/alert Orientation Level: Person;Place;Situation Person: Oriented Place: Oriented Situation: Oriented Year: 2022 Month: March Day of Week: Correct Memory: Impaired Memory Impairment: Decreased short term memory;Decreased recall of new information Decreased Short Term Memory: Verbal basic;Functional basic Immediate Memory Recall: Sock;Blue;Bed Memory Recall Sock: Without Cue Memory Recall Blue: Without Cue Memory Recall Bed: Without Cue Attention: Sustained Focused Attention: Appears intact Sustained Attention: Appears intact Selective Attention: Appears intact Alternating Attention Impairment: Verbal basic;Functional basic Awareness: Impaired Awareness Impairment: Emergent impairment;Anticipatory impairment Problem Solving: Impaired Problem Solving Impairment: Functional basic Executive Function: Self Monitoring;Self Correcting;Organizing  Organizing: Impaired Organizing Impairment: Verbal complex;Functional complex Self Monitoring: Impaired Self Monitoring Impairment: Functional basic;Verbal complex Self Correcting: Impaired Self Correcting Impairment: Functional basic Safety/Judgment: Impaired Sensation Sensation Light Touch: Impaired Detail Light Touch Impaired Details: Impaired LUE Hot/Cold: Not tested Proprioception: Impaired Detail Proprioception Impaired Details: Absent LUE Stereognosis: Not tested Coordination Gross Motor Movements are Fluid and Coordinated: No Fine Motor Movements are Fluid and Coordinated: No Coordination and Movement Description: Pt with Brunnstrum stage II level in the left arm with stage I in the  hand.  Slight flexor tone in the left elbow noted as well.  Max hand over hand assist needed for LUE use with bathing tasks. Motor  Motor Motor: Hemiplegia;Abnormal postural alignment and control Motor - Skilled Clinical Observations: Moderate LUE and LLE hemiparesis  Trunk/Postural Assessment  Cervical Assessment Cervical Assessment: Exceptions to [facility] (cervical rotation to the right) Thoracic Assessment Thoracic Assessment: Exceptions to [facility] (thoracic rounding) Lumbar Assessment Lumbar Assessment: Exceptions to [facility] (posterior pelvic tilt) Postural Control Postural Control: Deficits on evaluation  Balance Balance Balance Assessed: Yes Static Sitting Balance Static Sitting - Balance Support: Feet supported Static Sitting - Level of Assistance: 4: Min assist Static Sitting - Comment/# of Minutes: repeated L lateral lean/LOB Dynamic Sitting Balance Dynamic Sitting - Balance Support: During functional activity Dynamic Sitting - Level of Assistance: 2: Max assist Static Standing Balance Static Standing - Level of Assistance: 2: Max assist Dynamic Standing Balance Dynamic Standing - Level of Assistance: 1: +2 Total assist Extremity/Trunk Assessment RUE Assessment RUE Assessment: Within Functional Limits LUE Assessment LUE Assessment: Exceptions to [facility] Passive Range of Motion (PROM) Comments: WFLS for shoulder, wrist, and digits.  Slight increased tone noted in the elbow flexors. General Strength Comments: Brunnstrum stage II movement in the shoulder with internal rotation and adduction at trace level as well as elbow flexion/extension.  Hand is at [facility] stage I level with no active movement noted.  Care Tool Care Tool Self Care Eating        Oral Care    Oral Care Assist Level: Minimal Assistance - Patient > 75%    Bathing   Body parts bathed by patient: Left arm;Chest;Abdomen;Right upper leg;Front perineal area;Left upper leg;Right lower leg;Face Body parts bathed by  helper: Left lower leg;Right arm;Buttocks   Assist Level: Total Assistance - Patient < 25%    Upper Body Dressing(including orthotics)   What is the patient wearing?: Pull over shirt   Assist Level: Maximal Assistance - Patient 25 - 49%    Lower Body Dressing (excluding footwear)   What is the patient wearing?: Incontinence brief;Pants Assist for lower body dressing: Total Assistance - Patient < 25%    Putting on/Taking off footwear   What is the patient wearing?: Ted hose;Socks Assist for footwear: Total Assistance - Patient < 25%       Care Tool Toileting Toileting activity   Assist for toileting: 2 Helpers     Care Tool Bed Mobility Roll left and right activity   Roll left and right assist level: Moderate Assistance - Patient 50 - 74%    Sit to lying activity   Sit to lying assist level: Maximal Assistance - Patient 25 - 49%    Lying to sitting edge of bed activity   Lying to sitting edge of bed assist level: Maximal Assistance - Patient 25 - 49%     Care Tool Transfers Sit to stand transfer   Sit to stand assist level: 2 Helpers    Chair/bed transfer  Chair/bed transfer assist level: Maximal Assistance - Patient 25 - 49%     Toilet transfer   Assist Level: 2 Helpers     Care Tool Cognition Expression of Ideas and Wants Expression of Ideas and Wants: Some difficulty - exhibits some difficulty with expressing needs and ideas (e.g, some words or finishing thoughts) or speech is not clear   Understanding Verbal and Non-Verbal Content Understanding Verbal and Non-Verbal Content: Usually understands - understands most conversations, but misses some part/intent of message. Requires cues at times to understand   Memory/Recall Ability *first 3 days only      Refer to Care Plan for Long Term Goals  SHORT TERM GOAL WEEK 1 OT Short Term Goal 1 (Week 1): Pt will donn a pullover shirt with min assist and mod demonstrational cueing for hemi dressing technique. OT Short  Term Goal 2 (Week 1): Pt was able to complete LB bathing with max assist sit to stand and use of AE PRN. OT Short Term Goal 3 (Week 1): Pt will complete squat pivot toilet transfer to drop arm commode with mod assist. OT Short Term Goal 4 (Week 1): Pt will donn pants with max assist sit to stand with use of AE PRN.  Recommendations for other services: Therapeutic Recreation  Stress management   Skilled Therapeutic Intervention ADL ADL Eating: Minimal assistance Where Assessed-Eating: Wheelchair Grooming: Minimal assistance Where Assessed-Grooming: Wheelchair Upper Body Bathing: Moderate assistance Where Assessed-Upper Body Bathing: Wheelchair Lower Body Bathing: Dependent Where Assessed-Lower Body Bathing: Wheelchair;Sitting at sink;Standing at sink Upper Body Dressing: Maximal assistance Where Assessed-Upper Body Dressing: Wheelchair Lower Body Dressing: Dependent Where Assessed-Lower Body Dressing: Wheelchair;Sitting at sink;Standing at sink Toileting: Dependent Where Assessed-Toileting: Bedside Commode Toilet Transfer Method: Stand pivot Science writer: Bedside commode;Grab bars Tub/Shower Transfer: Not assessed Social research officer, government: Not assessed Mobility  Bed Mobility Bed Mobility: Supine to Sit;Sit to Supine Supine to Sit: Maximal Assistance - Patient - Patient 25-49% Sit to Supine: Maximal Assistance - Patient 25-49% Transfers Sit to Stand: 2 Helpers;Moderate Assistance - Patient 50-74% Stand to Sit: 2 Helpers;Moderate Assistance - Patient 50-74%   Session Note:  Pt in Gate wheelchair to start session.  Head rotation to the right with pt demonstrating some fatigue and sleepiness.  He was oriented to place, time, and situation, and able to state reason for hospitalization.  He worked on Dance movement psychotherapist sit to stand at the sink.  Max assist for maintaining sitting balance while reaching down to the RLE.  Total assist for lifting the LLE and positioning it over  the right knee to doff gripper sock and for washing the right foot.  Decreased thoroughness overall with bathing, requiring mod instructional cueing.  He needed max to total assist for standing to wash peri area and buttocks as well as for pulling items over hips.  Slight increased pushing to the left side noted in standing.  He needed max assist for washing the RUE using the LUE.  Mod instructional cueing was also needed for scanning across midline to locate liquid soap on the sink.  Max assist for donning pullover shirt secondary to trying to put his head through the arm hole when orienting it.  Attempted toileting with use of the 3:1 over the toilet.  He needed total assist for stand pivot transfer using the grab bar with the RUE.  Total assist was needed for clothing management after unsuccessful attempt.   Finished session with pt in the wheelchair with two of his sisters present.  Discussed with them and the pt the need for him to have 24 hour min assist at discharge based on perceptual, visual, and motor deficts.  They voice understanding and will try to arrange as he has four sisters.  Left pt up in the wheelchair with the call button and phone in reach and safety belt in place.     Discharge Criteria: Patient will be discharged from OT if patient refuses treatment 3 consecutive times without medical reason, if treatment goals not met, if there is a change in medical status, if patient makes no progress towards goals or if patient is discharged from hospital.  The above assessment, treatment plan, treatment alternatives and goals were discussed and mutually agreed upon: by patient and by family  Aryona Sill,[place] OTR/L 02/13/2021, 4:13 PM

## 2021-02-14 DIAGNOSIS — L8990 Pressure ulcer of unspecified site, unspecified stage: Secondary | ICD-10-CM | POA: Insufficient documentation

## 2021-02-14 NOTE — Progress Notes (Signed)
PROGRESS NOTE   Subjective/Complaints:  No issues overnite , was toileted with SLP and RN  Fluid intake is low , meal intake variable  ROS- no pains, - SOB, + constipation   Objective:   No results found. Recent Labs    02/12/21 0356 02/13/21 0535  WBC 5.6 5.6  HGB 13.1 14.5  HCT 37.1* 40.7  PLT 240 271   Recent Labs    02/13/21 0535  NA 135  K 3.7  CL 100  CO2 24  GLUCOSE 96  BUN 10  CREATININE 1.41*  CALCIUM 10.1    Intake/Output Summary (Last 24 hours) at 02/14/2021 0644 Last data filed at 02/13/2021 1814 Gross per 24 hour  Intake 480 ml  Output 200 ml  Net 280 ml     Pressure Injury 02/12/21 Penis Mid Stage 2 -  Partial thickness loss of dermis presenting as a shallow open injury with a red, pink wound bed without slough. Pink granulation area from pressure of condom cath that was too small. (Active)  02/12/21 1454  Location: Penis  Location Orientation: Mid  Staging: Stage 2 -  Partial thickness loss of dermis presenting as a shallow open injury with a red, pink wound bed without slough.  Wound Description (Comments): Pink granulation area from pressure of condom cath that was too small.  Present on Admission: Yes    Physical Exam: Vital Signs Blood pressure 122/75, pulse 65, temperature 98.1 F (36.7 C), resp. rate 18, height 6' (1.829 m), weight 117.6 kg, SpO2 100 %.  General: No acute distress Mood and affect are appropriate Heart: Regular rate and rhythm no rubs murmurs or extra sounds Lungs: Clear to auscultation, breathing unlabored, no rales or wheezes Abdomen: Positive bowel sounds, soft nontender to palpation, nondistended Extremities: venous stasis ulceration R>L medial distal thigh , thrombosed saphenous vein on Left , bilateral calf swelling  Skin: No evidence of breakdown, no evidence of rash Neurologic: Cranial nerves II through XII intact, motor strength is 5/5 in Right deltoid,  bicep, tricep, grip, hip flexor, knee extensors, ankle dorsiflexor and plantar flexor Sensory exam reduced sensation to LT LUE and LLE , Musculoskeletal: Full range of motion in all 4 extremities. No joint swelling    Assessment/Plan: 1. Functional deficits which require 3+ hours per day of interdisciplinary therapy in a comprehensive inpatient rehab setting.  Physiatrist is providing close team supervision and 24 hour management of active medical problems listed below.  Physiatrist and rehab team continue to assess barriers to discharge/monitor patient progress toward functional and medical goals  Care Tool:  Bathing    Body parts bathed by patient: Left arm,Chest,Abdomen,Right upper leg,Front perineal area,Left upper leg,Right lower leg,Face   Body parts bathed by helper: Left lower leg,Right arm,Buttocks     Bathing assist Assist Level: Total Assistance - Patient < 25%     Upper Body Dressing/Undressing Upper body dressing   What is the patient wearing?: Pull over shirt    Upper body assist Assist Level: Maximal Assistance - Patient 25 - 49%    Lower Body Dressing/Undressing Lower body dressing      What is the patient wearing?: Incontinence brief,Pants  Lower body assist Assist for lower body dressing: Total Assistance - Patient < 25%     Toileting Toileting    Toileting assist Assist for toileting: Dependent - Patient 0%     Transfers Chair/bed transfer  Transfers assist     Chair/bed transfer assist level: Maximal Assistance - Patient 25 - 49%     Locomotion Ambulation   Ambulation assist   Ambulation activity did not occur: Safety/medical concerns          Walk 10 feet activity   Assist  Walk 10 feet activity did not occur: Safety/medical concerns        Walk 50 feet activity   Assist Walk 50 feet with 2 turns activity did not occur: Safety/medical concerns         Walk 150 feet activity   Assist Walk 150 feet activity  did not occur: Safety/medical concerns         Walk 10 feet on uneven surface  activity   Assist Walk 10 feet on uneven surfaces activity did not occur: Safety/medical concerns         Wheelchair     Assist Will patient use wheelchair at discharge?:  (TBD)             Wheelchair 50 feet with 2 turns activity    Assist            Wheelchair 150 feet activity     Assist          Blood pressure 122/75, pulse 65, temperature 98.1 F (36.7 C), resp. rate 18, height 6' (1.829 m), weight 117.6 kg, SpO2 100 %.   Medical Problem List and Plan: 1.  Left-sided hemiparesis secondary to right MCA infarct likely atheroembolic status post thrombectomy as well as posterior frontoparietal region infarct              -patient may shower             -ELOS/Goals: 14-16 days/Supervision/Min A           Cont CIR PT, OT, SLP  2.  Antithrombotics: -DVT/anticoagulation: Bilateral lower extremity DVT/PE in the setting of hypercoagulopathy disorder.  Continue Pradaxa.             -antiplatelet therapy: Aspirin 81 mg daily 3. Pain Management: Tylenol as needed 4. Mood: Provide emotional support             -antipsychotic agents: N/A 5. Neuropsych: This patient is capable of making decisions on his own behalf. 6. Skin/Wound Care: Routine skin checks 7. Fluids/Electrolytes/Nutrition: Routine in and outs             CMP ordered 8.  Seizure disorder.  Keppra 500 mg twice daily 9.  Hypertension.  Lisinopril 40 mg daily, hydralazine 75 mg every 8 hours, Norvasc 10 mg daily.      Vitals:   02/13/21 1940 02/14/21 0419  BP: (!) 133/107 122/75  Pulse: 61 65  Resp: 18 18  Temp: 98.2 F (36.8 C) 98.1 F (36.7 C)  SpO2: 100% 100%  controlled 3/18 10.  Hypothyroidism.  Synthroid 11.  CKD stage III.  Baseline creatinine 1.83.  Improved 1.31 on 3/17             CMP ordered for tomorrow 12.  History of tobacco abuse.  Provide counseling 13.  Hyperlipidemia: Lipitor     LOS: 2 days A FACE TO FACE EVALUATION WAS PERFORMED  Charlett Blake 02/14/2021, 6:44 AM

## 2021-02-14 NOTE — Progress Notes (Signed)
Occupational Therapy Session Note  Patient Details  Name: Matthew Wise MRN: [id number] Date of Birth: 1961/03/05  Today's Date: 02/14/2021 OT Individual Time: 8921-1941 OT Individual Time Calculation (min): 62 min    Short Term Goals: Week 1:  OT Short Term Goal 1 (Week 1): Pt will donn a pullover shirt with min assist and mod demonstrational cueing for hemi dressing technique. OT Short Term Goal 2 (Week 1): Pt was able to complete LB bathing with max assist sit to stand and use of AE PRN. OT Short Term Goal 3 (Week 1): Pt will complete squat pivot toilet transfer to drop arm commode with mod assist. OT Short Term Goal 4 (Week 1): Pt will donn pants with max assist sit to stand with use of AE PRN.  Skilled Therapeutic Interventions/Progress Updates:    Pt up in tilt in space wheelchair to start session.  Therapist took him down to the therapy gym and completed squat pivot transfer to the therapy mat with max assist.  Worked on sitting posture as well as LUE neuromuscular re-education in sitting.  Mod demonstrational cueing for upright posture with work on functional reaching task with the RUE to target while weightbearing through the LUE.  Max assist to regain balance with left lean or reaching tasks.  Max assist was needed for sit to squat to reach for clothespins with the RUE while weightbearing through the LUE as well.  Progressed to working on active left shoulder external rotation with hand positioned on therapy ball.  He demonstrates inconsistent trace external rotation in the left shoulder with greater movement noted with internal rotation as well as some increased tone.  He needed max instructional cueing for head turn to the left in order to maintain visual attention on the hand while trying to activate it. Pt returned to the wheelchair at max assist squat pivot and then back to the bed at the same level to rest per his request.  Call button and phone in reach with LUE and LLE positioned  on pillows.  Provided handout in room as well for LUE and LLE positioning.    Therapy Documentation Precautions:  Precautions Precautions: Fall,Other (comment) Precaution Comments: L hemiplegia, left neglect, hx of seizures, age indeterminate B LE DVTs Restrictions Weight Bearing Restrictions: No  Pain: Pain Assessment Pain Scale: Faces Pain Score: 0-No pain Faces Pain Scale: Hurts a little bit Pain Type: Acute pain Pain Location: Head Pain Orientation: Right Pain Descriptors / Indicators: Discomfort;Headache Pain Intervention(s): Repositioned;Emotional support ADL: See Care Tool Section for some details of mobility and selfcare  Therapy/Group: Individual Therapy  MCGUIRE,Chayanne OTR/L 02/14/2021, 12:37 PM

## 2021-02-14 NOTE — IPOC Note (Signed)
Overall Plan of Care (IPOC) Patient Details Name: Matthew Wise MRN: [id number] DOB: 13-Apr-1961  Admitting Diagnosis: Right middle cerebral artery stroke [facility])  Hospital Problems: Principal Problem:   Right middle cerebral artery stroke ([place]) Active Problems:   Pressure injury of skin     Functional Problem List: Nursing Bladder,Bowel,Endurance,Medication Management,Motor,Safety,Skin Integrity  PT Balance,Perception,Behavior,Safety,Edema,Sensory,Endurance,Skin Integrity,Motor,Nutrition,Pain  OT Balance,Cognition,Endurance,Motor,Vision,Sensory,Safety,Perception  SLP Cognition,Nutrition  TR         Basic ADL's: OT Eating,Grooming,Bathing,Dressing,Toileting     Advanced  ADL's: OT       Transfers: PT Bed Mobility,Bed to Chair,Car,Furniture  OT Toilet,Tub/Shower     Locomotion: PT Ambulation,Wheelchair Mobility,Stairs     Additional Impairments: OT Fuctional Use of Upper Extremity  SLP Swallowing,Social Cognition   Memory,Attention,Awareness,Problem Solving  TR      Anticipated Outcomes Item Anticipated Outcome  Self Feeding supervision  Swallowing  Mod I   Basic self-care  min assist  Toileting  min assist   Bathroom Transfers min assist  Bowel/Bladder  To be continent of bowel/bladder while in rehab  Transfers  min assist  Locomotion  min assist with LRAD  Communication     Cognition  Supervision A  Pain  no complain of pain  Safety/Judgment  able to call for help and express needs   Therapy Plan: PT Intensity: Minimum of 1-2 x/day ,45 to 90 minutes PT Frequency: 5 out of 7 days PT Duration Estimated Length of Stay: 3.5 - 4 weeks OT Intensity: Minimum of 1-2 x/day, 45 to 90 minutes OT Frequency: 5 out of 7 days OT Duration/Estimated Length of Stay: 24-27 days SLP Intensity: Minumum of 1-2 x/day, 30 to 90 minutes SLP Frequency: 3 to 5 out of 7 days SLP Duration/Estimated Length of Stay: 3.5-4 weeks   Due to the current state of emergency,  patients may not be receiving their 3-hours of Medicare-mandated therapy.   Team Interventions: Nursing Interventions Patient/Family Education,Bladder Management,Medication Management,Discharge Planning,Bowel Management,Skin Care/Wound Management  PT interventions Ambulation/gait training,Community Corporate treasurer re-education,Psychosocial support,Stair training,UE/LE Strength taining/ROM,Wheelchair propulsion/positioning,Balance/vestibular training,Discharge planning,Functional electrical stimulation,Pain management,Therapeutic Activities,Skin care/wound management,UE/LE Coordination activities,Cognitive remediation/compensation,Disease management/prevention,Functional mobility training,Splinting/orthotics,Patient/family education,Therapeutic Exercise,Visual/perceptual remediation/compensation  OT Interventions Balance/vestibular training,Discharge planning,Self Care/advanced ADL retraining,Therapeutic Activities,UE/LE Coordination activities,Cognitive remediation/compensation,Disease mangement/prevention,Functional mobility training,Patient/family education,Therapeutic Exercise,Visual/perceptual remediation/compensation,DME/adaptive equipment instruction,Neuromuscular re-education,UE/LE Strength taining/ROM,Wheelchair propulsion/positioning,Psychosocial support,Splinting/orthotics  SLP Interventions Cognitive remediation/compensation,Cueing hierarchy,Dysphagia/aspiration precaution training,Functional tasks,Patient/family education,Internal/external aids  TR Interventions    SW/CM Interventions Discharge Planning,Psychosocial Support,Patient/Family Education,Disease Management/Prevention   Barriers to Discharge MD  Medical stability and Home enviroment access/loayout  Nursing      PT Inaccessible home environment,Decreased caregiver support,Home environment access/layout,Lack of/limited family support,Other (comments) L inattention  OT Decreased  caregiver support Pt will need 24 hour supervision  SLP      SW       Team Discharge Planning: Destination: PT-Home ,OT- Home , SLP-Home Projected Follow-up: PT-Home health PT,24 hour supervision/assistance, OT-  Home health OT,24 hour supervision/assistance, SLP-Home Health SLP,24 hour supervision/assistance Projected Equipment Needs: PT-To be determined, OT- To be determined, SLP-None recommended by SLP Equipment Details: PT- , OT-  Patient/family involved in discharge planning: PT- Patient,  OT-Patient,Family member/caregiver, SLP-Patient  MD ELOS 14-16d Medical Rehab Prognosis:  Good Assessment:   60 year old right-handed male with history of pulmonary emboli maintained on Eliquis, hypertension, CKD stage III, hyperlipidemia, prediabetes, sickle cell disease, tobacco abuse as well as right MCA infarction recently admitted 01/21/2021 to 01/27/2021 and discharged home ambulating 150 feet rolling walker. History taken from chart review and patient. Patient lives alone. 1  level home 3 steps to entry. Independent with assistive device. He has a sister who checks on him regularly. He presented on 02/03/2021 with left hemiparesis, subacute onset. Crania CT scan unremarkable for acute intracranial process. Suspect acute infarct of the right frontal parietal lobe posterior superior to chronic infarct with probable involvement of lateral precentral and postcentral gyri. Patient did not receive TPA. CT angiogram of head and neck showed abrupt occlusion of the right M1 segment new as compared to prior CTA of 01/25/2021. Acute core infarct of the posterior right frontal parietal region largely corresponding with a completed infarct as seen on prior MRI. EEG evidence of focal seizure arising from the right posterior quadrant during which patient was noted to have left upper extremity twitching. Additionally there was cortical dysfunction in the right frontal temporal region likely due to underlying stroke. Most  recent echocardiogram with ejection fraction 55 to 60%. Admission chemistries unremarkable except creatinine 1.83, urine drug screen negative, hemoglobin A1c 5.9. Alcohol negative. Patient underwent arteriogram with revascularization of right MCA M1 segment per interventional radiology. Latest follow-up MRI showed large acute right MCA territory infarct with petechial hemorrhage and minimal leftward midline shift as well as small acute left cerebellar infarct new small acute early subacute infarct in left occipital left parietal left frontal lobes. Bilateral extremity Dopplers findings consistent with age-indeterminate DVT involving the right common femoral vein and right femoral vein as well as left femoral vein and left popliteal vein, left common femoral and SFA junction. Patient was placed on intravenous heparin remained on low-dose aspirin and Pradaxa was added. He had been loaded for Keppra for his suspected seizure. Hospital course further complicated by dysphagia, started on Dysphagia #3 thin liquid diet    See Team Conference Notes for weekly updates to the plan of care

## 2021-02-14 NOTE — Progress Notes (Signed)
Speech Language Pathology Daily Session Note  Patient Details  Name: Matthew Wise MRN: [id number] Date of Birth: October 19, 1961  Today's Date: 02/14/2021 SLP Individual Time: 0804-0900 SLP Individual Time Calculation (min): 56 min  Short Term Goals: Week 1: SLP Short Term Goal 1 (Week 1): Pt will demonsrate ability to use compensatory strategies to clear left buccal poceting and minimize left anterior loss of POs with Supervision A verbal cues. SLP Short Term Goal 2 (Week 1): Pt will demonstrate efficient mastication and oral clearance of regular textures a minimum of 2 sessions and no more than Min A cues required for oral clearance prior to advancement. SLP Short Term Goal 3 (Week 1): Pt will demonstrate ability to problem solve basic to mildly complex tasks with Mod A verbal/visual cuess. SLP Short Term Goal 4 (Week 1): Pt will recall new and/or daily information with Mod A cues. SLP Short Term Goal 5 (Week 1): Pt will attend to left side of his body and/or locate items to left of midline with Moderate cueing. SLP Short Term Goal 6 (Week 1): Pt will detect functional errors with Min A cues.  Skilled Therapeutic Interventions:  Pt was seen for skilled ST targeting goals for dysphagia and cognition.  Pt was asleep in bed upon therapist's arrival but awakened to voice and light touch and was agreeable to participating in therapy.  Pt reported needing to go to the bathroom.  His incontinence brief was wet but he was able to void into the urinal.  Pt was then transferred to tilt in space wheelchair via +2 Stedy assist.  Pt was responsive to verbal cues to achieve adequate midline orientation during transfers.  Pt consumed dys 3 textures and thin liquids during breakfast with supervision verbal cues for use of swallowing precautions.  Throughout the course of the meal, pt asked for assistance opening containers appropriately and selectively attended to meal for its duration without needing cues for  redirection.  No overt s/s of aspiration were evident with solids or liquids.  After meal, pt had minimal evidence of oral residue.  He was able to complete oral care while seated at the sink with set up assist. Pt was left in wheelchair with chair alarm set and call bell within reach.  Continue per current plan of care.    Pain Pain Assessment Pain Scale: 0-10 Pain Score: 0-No pain  Therapy/Group: Individual Therapy  [name], Selinda Orion 02/14/2021, 12:26 PM

## 2021-02-14 NOTE — TOC Benefit Eligibility Note (Signed)
Patient Teacher, English as a foreign language completed.    The patient is currently admitted and upon discharge could be taking Pradaxa 150 mg.  The current 30 day co-pay is, $9.85.   The patient is insured through  [place], [place] Patient Advocate Specialist Brave Team Direct Number: [phone]  Fax: [phone]

## 2021-02-15 NOTE — Progress Notes (Signed)
Physical Therapy Session Note  Patient Details  Name: Matthew Wise MRN: [id number] Date of Birth: Feb 05, 1961  Today's Date: 02/15/2021 PT Individual Time: 5681-2751 PT Individual Time Calculation (min): 34 min   Short Term Goals: Week 1:  PT Short Term Goal 1 (Week 1): Pt will perform supine<>sit with mod assist PT Short Term Goal 2 (Week 1): Pt will perform bed<>chair transfers with mod assist PT Short Term Goal 3 (Week 1): Pt will perform sit<>stands using LRAD with +2 mod assist PT Short Term Goal 4 (Week 1): Pt will initiate gait training  Skilled Therapeutic Interventions/Progress Updates:    Pt received asleep in supine. He denies any pain and was agreeable to therapy. Pt verbalized needing to urinate and required set up assist with urinal; pt was continent of urine. Pt very congested and lethargic throughout session. Transferred to sidelying on L side mod assist. Transferred supine to sitting EOB max assist. Transferred to standing max assist +2 to pull pants over hips and then returned to sitting EOB. Pt required rest break as he felt "light headed" and congested. Vitals assessed: BP 101/79, MAP 85, HR 77bpm. Pt transferred R squat pivot to WC with max assist. Verbal cuing to reach R hand toward WC, and to lean torso towards L side to facilitate LLE extension/abduction towards R side via head-hips relationship. Washed hands at [facility] level. Verbal cuing to use both hands when washing/cleaning with hand-over-hand assistance provided for LUE integration. Pt verbalized frustration with himself for "not being able to do the stuff that [he] normally does." He was given education/encouragement that he is still early in the rehabilitation progress and that there is hope for him to regain function. Pt transferred back to EOB squat pivot max assist and to sidelying on L side max assist with L arm protracted. Pt left with needs within reach and bed alarm turned on. Will discuss neuropsych consult with  Education officer, museum.   Therapy Documentation Precautions:  Precautions Precautions: Fall,Other (comment) Precaution Comments: L hemiplegia, left neglect, hx of seizures, age indeterminate B LE DVTs Restrictions Weight Bearing Restrictions: No    Therapy/Group: Individual Therapy  Eleonore Chiquito, SPT 02/15/2021, 6:26 PM

## 2021-02-15 NOTE — Progress Notes (Signed)
+/-   sleep. Continent and incontinent of urine. Requiring assistance with placing & holding urinal. Doesn't call when wet. BLE edema. Flat affect. Denies pain. [name]

## 2021-02-15 NOTE — Progress Notes (Signed)
Physical Therapy Session Note  Patient Details  Name: Matthew Wise MRN: [id number] Date of Birth: [birth year]  Today's Date: 02/15/2021 PT Individual Time: 0909-1004 PT Individual Time Calculation (min): 55 min  Short Term Goals: Week 1:  PT Short Term Goal 1 (Week 1): Pt will perform supine<>sit with mod assist PT Short Term Goal 2 (Week 1): Pt will perform bed<>chair transfers with mod assist PT Short Term Goal 3 (Week 1): Pt will perform sit<>stands using LRAD with +2 mod assist PT Short Term Goal 4 (Week 1): Pt will initiate gait training  Skilled Therapeutic Interventions/Progress Updates:  Pt received supine in bed asleep, but upon awakening agreeable to therapy session. Pt fatigued throughout session but agreeable to continue participating. Supine>sitting L EOB, HOB flat and not using bedrail, with max assist for trunk upright - multimodal cuing for L LE management and sequencing of task to increase pt independence. Sitting EOB donned clean shirt and pants max assist - requires close supervision/CGA and intermittent cuing for trunk control due to progressive L lateral lean/LOB with poor awareness of this. Sit<>stand to/from EOB with +2 mod assist for lifting to stand and balance due to L lean while pulling pants up over hips total assist. Continues to demonstrate L hemibody inattention throughout session. R squat pivot EOB>TIS w/c with max assist for lifting/pivoting hips, L LE management/positioning, and cuing for head/hips relationship.  Transported to/from gym in w/c for time management and energy conservation. L squat pivot w/c>EOM max assist for lifting/pivoting hips and cuing as above. Participated in PASS, details below, with pt scoring 12/36. Participated in repeated sit<>stands to/from EOM x5, no UE support, working on L LE NMR with mirror feedback for midline orientation - demos L LE externally rotated, abducted, and knee hyperextension with decreased WBing (likely extensor tone),  therapist readjusted foot positioning to promote improved alignment and WBing - progressed from requiring max assist of 1 to mod assist of 1 to come to stand - only able to tolerate standing ~10-20seconds each time prior to fatigue and reporting L LE feeling "weak" and requests to sit (denies lightheadedness). R squat pivot EOM>TIS w/c max assist as above. Transported back to room. Left sitting tilted back in TIS w/c with needs in reach, L UE supported on pillows, and seat belt alarm on.    Postural Assessment Scale for Stroke Patients (PASS)  Give the subject instructions for each item as written below. When scoring the item, record the lowest response category that applies for each item.  Maintaining a Posture  _2_ 1. Sitting Without Support Instructions: Have the subject sit on a bench/mat without back support and with feet flat on the floor. (3) Can sit for 5 minutes without support (2) Can sit for more than 10 seconds without support (1) Can sit with slight support (for example, by 1 hand) (0) Cannot sit  _2_ 2. Standing With Support Instructions: Have the subject stand, providing support as needed. Evaluate only the ability to stand with or without support. Do not consider the quality of the stance. (3) Can stand with support of only 1 hand (2) Can stand with moderate support of 1 person (1) Can stand with strong support of 2 people (0) Cannot stand, even with support  _0_ 3. Standing Without Support Instructions: Have the subject stand without support. Evaluate only the ability to stand with or without support. Do not consider the quality of the stance. (3) Can stand without support for more than 1 minute and simultaneously perform  arm movements at about shoulder level (2) Can stand without support for 1 minute or stands slightly asymmetrically  (1) Can stand without support for 10 seconds or leans heavily on 1 leg (0) Cannot stand without support  _0_ 4. Standing on Nonparetic  Leg Instructions: Have the subject stand on the nonparetic leg. Evaluate only the ability to bear weight entirely on the nonparetic leg. Do not consider how the subject accomplishes the task. (3) Can stand on nonparetic leg for more than 10 seconds (2) Can stand on nonparetic leg for more than 5 seconds (1) Can stand on nonparetic leg for a few seconds (0) Cannot stand on nonparetic leg *unable to lift L LE up to perform single leg stance on R  _0_ 5. Standing on Paretic Leg Instructions: Have the subject stand on the paretic leg. Evaluate only the ability to bear weight entirely on the paretic leg. Do not consider how the subject accomplishes the task. (3) Can stand on paretic leg for more than 10 seconds (2) Can stand on paretic leg for more than 5 seconds (1) Can stand on paretic leg for a few seconds (0) Cannot stand on paretic leg  Maintaining Posture SUBTOTAL ___4___  Changing a Posture  _3_ 6. Supine to Paretic Side Lateral Instructions: Begin with the subject in supine on a treatment mat. Instruct the subject to roll to the paretic side (lateral movement). Assist as necessary. Evaluate the subject's performance on the amount of help required. Do not consider the quality of performance. (3) Can perform without help (2) Can perform with little help (1) Can perform with much help (0) Cannot perform  _1_ 7. Supine to Nonparetic Side Lateral Instructions: Begin with the subject in supine on a treatment mat. Instruct the subject to roll to the nonparetic side (lateral movement). Assist as necessary. Evaluate the subject's performance on the amount of help required. Do not consider the quality of performance. (3) Can perform without help (2) Can perform with little help (1) Can perform with much help (0) Cannot perform  _1_ 8. Supine to Sitting Up on the Edge of the Mat Instructions: Begin with the subject in supine on a treatment mat. Instruct the subject to come to sitting on the  edge of the mat. Assist as necessary. Evaluate the subject's performance on the amount of help required. Do not consider the quality of performance. (3) Can perform without help (2) Can perform with little help (1) Can perform with much help (0) Cannot perform  _1_ 9. Sitting on the Edge of the Mat to Supine Instructions: Begin with the on the edge of a treatment mat. Instruct the subject to return to supine. Assist as necessary. Evaluate the subject's performance on the amount of help required. Do not consider the quality of performance. (3) Can perform without help (2) Can perform with little help (1) Can perform with much help (0) Cannot perform  _1_ 10. Sitting to Standing Up Instructions: Begin with the subject sitting on the edge of a treatment mat. Instruct the subject to stand up without support. Assist if necessary. Evaluate the subject's performance on the amount of help required. Do not consider the quality of performance. (3) Can perform without help (2) Can perform with little help (1) Can perform with much help (0) Cannot perform  _1_ 11. Standing Up to Sitting Down Instructions: Begin with the subject standing by edge of a treatment mat. Instruct the subject to sit on edge of mat without support. Assist if  necessary. Evaluate the subject's performance on the amount of help required. Do not consider the quality of performance. (3) Can perform without help (2) Can perform with little help (1) Can perform with much help (0) Cannot perform  _0_ 12. Standing, Picking Up a Pencil from the Floor Instructions: Begin with the subject standing. Instruct the subject to pick up a pencil fro the floor without support. Assist if necessary. Evaluate the subject's performance on the amount of help required. Do not consider the quality of performance. (3) Can perform without help (2) Can perform with little help (1) Can perform with much help (0) Cannot perform *not safe to  attempt  Changing Posture SUBTOTAL __8___   TOTAL __12___   Therapy Documentation Precautions:  Precautions Precautions: Fall,Other (comment) Precaution Comments: L hemiplegia, left neglect, hx of seizures, age indeterminate B LE DVTs Restrictions Weight Bearing Restrictions: No  Pain: Denies pain during session.  Therapy/Group: Individual Therapy  Tawana Scale , PT, DPT, CSRS  02/15/2021, 7:52 AM

## 2021-02-15 NOTE — Progress Notes (Signed)
Physical Therapy Session Note  Patient Details  Name: Matthew Wise MRN: [id number] Date of Birth: November 22, 1961  Today's Date: 02/14/2021 PT Individual Time: 1348-1500   72 min   Short Term Goals: Week 1:  PT Short Term Goal 1 (Week 1): Pt will perform supine<>sit with mod assist PT Short Term Goal 2 (Week 1): Pt will perform bed<>chair transfers with mod assist PT Short Term Goal 3 (Week 1): Pt will perform sit<>stands using LRAD with +2 mod assist PT Short Term Goal 4 (Week 1): Pt will initiate gait training  Skilled Therapeutic Interventions/Progress Updates:   Pt received supine in bed and agreeable to PT. Supine>sit transfer with max assist and cues for sequencing and use of RUE to push in to sitting. Squat pivot transfer to [facility] with mod assist from PT with max cues for LE placement and initiaiton. Pt transported to rehab gym in [facility]. Pre-gait stepping task at rail in hall 3 x 6 BLE with cues for attention to the LLE, improved weight shift R, and activation of flexion pattern to advance the LLE. UBE forward/reverse x 4 min each direciton with rest break between bouts. Min-mod assist throughout to improved shoulder ROM and cues for midline orientation with fatigue to prevent lean the L; PT placed bosted behind back to force improved trunkal acvitation. Kinetron BLE marching 5 x 1 min with mod assist throughout to improve hip flexion and hip alignment. Pt returned to room and performed squat pivot transfer to bed with max assist to the L. Sit>supine completed with max assist, and left supine in bed with call bell in reach and all needs met.        Therapy Documentation Precautions:  Precautions Precautions: Fall,Other (comment) Precaution Comments: L hemiplegia, left neglect, hx of seizures, age indeterminate B LE DVTs Restrictions Weight Bearing Restrictions: No Vital Signs: Therapy Vitals Pulse Rate: 60 BP: 120/71 Pain: Pain Assessment Pain Scale: 0-10 Pain Score: 0-No  pain    Therapy/Group: Individual Therapy  Lorie Phenix 02/15/2021, 8:43 AM

## 2021-02-15 NOTE — Progress Notes (Signed)
PROGRESS NOTE   Subjective/Complaints: No complaints. Slept well. Denies pain  ROS: Patient denies fever, rash, sore throat, blurred vision, nausea, vomiting, diarrhea, cough, shortness of breath or chest pain, joint or back pain, headache, or mood change.   Objective:   No results found. Recent Labs    02/13/21 0535  WBC 5.6  HGB 14.5  HCT 40.7  PLT 271   Recent Labs    02/13/21 0535  NA 135  K 3.7  CL 100  CO2 24  GLUCOSE 96  BUN 10  CREATININE 1.41*  CALCIUM 10.1    Intake/Output Summary (Last 24 hours) at 02/15/2021 1422 Last data filed at 02/15/2021 0800 Gross per 24 hour  Intake 700 ml  Output 800 ml  Net -100 ml     Pressure Injury 02/12/21 Penis Mid Stage 2 -  Partial thickness loss of dermis presenting as a shallow open injury with a red, pink wound bed without slough. Pink granulation area from pressure of condom cath that was too small. (Active)  02/12/21 1454  Location: Penis  Location Orientation: Mid  Staging: Stage 2 -  Partial thickness loss of dermis presenting as a shallow open injury with a red, pink wound bed without slough.  Wound Description (Comments): Pink granulation area from pressure of condom cath that was too small.  Present on Admission: Yes    Physical Exam: Vital Signs Blood pressure 120/71, pulse 60, temperature 97.9 F (36.6 C), resp. rate 18, height 6' (1.829 m), weight 117.6 kg, SpO2 100 %.  Constitutional: No distress . Vital signs reviewed. HEENT: EOMI, oral membranes moist Neck: supple Cardiovascular: RRR without murmur. No JVD    Respiratory/Chest: CTA Bilaterally without wheezes or rales. Normal effort    GI/Abdomen: BS +, non-tender, non-distended Ext: no clubbing, cyanosis, or edema Psych: pleasant and cooperative Skin: No evidence of breakdown, no evidence of rash Neurologic: Cranial nerves II through XII intact, motor strength is 5/5 in Right deltoid,  bicep, tricep, grip, hip flexor, knee extensors, ankle dorsiflexor and plantar flexor Sensory exam reduced sensation to LT LUE and LLE --stable exam Musculoskeletal: Full range of motion in all 4 extremities. No joint swelling    Assessment/Plan: 1. Functional deficits which require 3+ hours per day of interdisciplinary therapy in a comprehensive inpatient rehab setting.  Physiatrist is providing close team supervision and 24 hour management of active medical problems listed below.  Physiatrist and rehab team continue to assess barriers to discharge/monitor patient progress toward functional and medical goals  Care Tool:  Bathing    Body parts bathed by patient: Left arm,Chest,Abdomen,Right upper leg,Front perineal area,Left upper leg,Right lower leg,Face   Body parts bathed by helper: Left lower leg,Right arm,Buttocks     Bathing assist Assist Level: Total Assistance - Patient < 25%     Upper Body Dressing/Undressing Upper body dressing   What is the patient wearing?: Pull over shirt    Upper body assist Assist Level: Maximal Assistance - Patient 25 - 49%    Lower Body Dressing/Undressing Lower body dressing      What is the patient wearing?: Incontinence brief,Pants     Lower body assist Assist for lower body  dressing: Total Assistance - Patient < 25%     Toileting Toileting    Toileting assist Assist for toileting: Dependent - Patient 0%     Transfers Chair/bed transfer  Transfers assist     Chair/bed transfer assist level: Maximal Assistance - Patient 25 - 49% (squat pivot)     Locomotion Ambulation   Ambulation assist   Ambulation activity did not occur: Safety/medical concerns          Walk 10 feet activity   Assist  Walk 10 feet activity did not occur: Safety/medical concerns        Walk 50 feet activity   Assist Walk 50 feet with 2 turns activity did not occur: Safety/medical concerns         Walk 150 feet  activity   Assist Walk 150 feet activity did not occur: Safety/medical concerns         Walk 10 feet on uneven surface  activity   Assist Walk 10 feet on uneven surfaces activity did not occur: Safety/medical concerns         Wheelchair     Assist Will patient use wheelchair at discharge?:  (TBD)             Wheelchair 50 feet with 2 turns activity    Assist            Wheelchair 150 feet activity     Assist          Blood pressure 120/71, pulse 60, temperature 97.9 F (36.6 C), resp. rate 18, height 6' (1.829 m), weight 117.6 kg, SpO2 100 %.   Medical Problem List and Plan: 1.  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  Antithrombotics: -DVT/anticoagulation: Bilateral lower extremity DVT/PE in the setting of hypercoagulopathy disorder.  Continue Pradaxa.             -antiplatelet therapy: Aspirin 81 mg daily 3. Pain Management: Tylenol as needed 4. Mood: Provide emotional support             -antipsychotic agents: N/A 5. Neuropsych: This patient is capable of making decisions on his own behalf. 6. Skin/Wound Care: Routine skin checks 7. Fluids/Electrolytes/Nutrition: Routine in and outs             CMP ordered 8.  Seizure disorder.  Keppra 500 mg twice daily 9.  Hypertension.  Lisinopril 40 mg daily, hydralazine 75 mg every 8 hours, Norvasc 10 mg daily.      Vitals:   02/15/21 0659 02/15/21 0733  BP: 111/71 120/71  Pulse: 63 60  Resp:    Temp:    SpO2:    controlled 3/19 10.  Hypothyroidism.  Synthroid 11.  CKD stage III.  Baseline creatinine 1.83.  Improved 1.31 on 3/17             CMP ordered for tomorrow 12.  History of tobacco abuse.  Provide counseling 13.   Hyperlipidemia: Lipitor    LOS: 3 days A FACE TO FACE EVALUATION WAS PERFORMED  Meredith Staggers 02/15/2021, 2:22 PM

## 2021-02-15 NOTE — Progress Notes (Signed)
Speech Language Pathology Daily Session Note  Patient Details  Name: Matthew Wise MRN: [id number] Date of Birth: 27-Feb-1961  Today's Date: 02/15/2021 SLP Individual Time: 8101-7510 SLP Individual Time Calculation (min): 40 min  Short Term Goals: Week 1: SLP Short Term Goal 1 (Week 1): Pt will demonsrate ability to use compensatory strategies to clear left buccal poceting and minimize left anterior loss of POs with Supervision A verbal cues. SLP Short Term Goal 2 (Week 1): Pt will demonstrate efficient mastication and oral clearance of regular textures a minimum of 2 sessions and no more than Min A cues required for oral clearance prior to advancement. SLP Short Term Goal 3 (Week 1): Pt will demonstrate ability to problem solve basic to mildly complex tasks with Mod A verbal/visual cuess. SLP Short Term Goal 4 (Week 1): Pt will recall new and/or daily information with Mod A cues. SLP Short Term Goal 5 (Week 1): Pt will attend to left side of his body and/or locate items to left of midline with Moderate cueing. SLP Short Term Goal 6 (Week 1): Pt will detect functional errors with Min A cues.  Skilled Therapeutic Interventions: Skilled treatment session focused on dysphagia and cognitive goals. Upon arrival, patient was consuming his breakfast meal of Dys. 3 textures with thin liquids. Patient consumed meal without overt s/s of aspiration and required supervision level verbal cues to self-monitor and correct left buccal pocketing. Throughout session, patient noted to be lethargic and required extra time and overall Min verbal cues for functional problem solving during a basic money and medication management tasks. Min A verbal cues were also needed for sustained attention to task for ~30 minutes. Patient left upright in bed with alarm on and all needs within reach. Continue with current plan of care.      Pain Pain Assessment Pain Scale: 0-10 Pain Score: 0-No pain  Therapy/Group: Individual  Therapy  Liliyana Thobe 02/15/2021, 9:15 AM

## 2021-02-15 NOTE — Progress Notes (Signed)
Occupational Therapy Session Note  Patient Details  Name: Matthew Wise MRN: [id number] Date of Birth: 1961-04-07  Today's Date: 02/15/2021 OT Individual Time: 1300-1355 OT Individual Time Calculation (min): 55 min    Short Term Goals: Week 1:  OT Short Term Goal 1 (Week 1): Pt will donn a pullover shirt with min assist and mod demonstrational cueing for hemi dressing technique. OT Short Term Goal 2 (Week 1): Pt was able to complete LB bathing with max assist sit to stand and use of AE PRN. OT Short Term Goal 3 (Week 1): Pt will complete squat pivot toilet transfer to drop arm commode with mod assist. OT Short Term Goal 4 (Week 1): Pt will donn pants with max assist sit to stand with use of AE PRN.  Skilled Therapeutic Interventions/Progress Updates:    Pt greeted in the TIS, requesting to return to bed. Pt agreeable to remain sitting up for duration of tx and then returning to bed at the end. Since ADL needs were met, including toileting, he was escorted via w/c to an outdoor setting for psychosocial health. With mod cues, pt scanned fully towards his Lt side to tell OT what he saw in the environment on multiple occasions. Due to hypertonicity, OT performed gentle stretching to pts Lt arm all joints. Had pt visually attend to his Lt arm while counting aloud with therapist while stretches were maintained. Pt required max cues for this. Noted inability to fully extend the elbow or supinate his hand during PROM due to tone. Shoulder abduction limited to ~45 degrees. Taught him self ROM for elbow extension, wrist extension, digit extension, and thumb abduction. Once he returned to the room, Mod A of 2 for bariatric Stedy transfer back to bed. Pt did well with maintaining upright postural alignment while semi perched on Stedy paddles. While supine in bed, worked on core strengthening by having pt reach outside of base of support and across midline to tap target. Pt the reported needing to void  bladder. Max A for setup of urinal. Pt with poor attention to task when therapist cued him to assist. He was left in care of RN to keep trying to void in urinal.    Therapy Documentation Precautions:  Precautions Precautions: Fall,Other (comment) Precaution Comments: L hemiplegia, left neglect, hx of seizures, age indeterminate B LE DVTs Restrictions Weight Bearing Restrictions: No Vital Signs: Therapy Vitals Temp: 99.8 F (37.7 C) Temp Source: Oral Pulse Rate: 64 Resp: 18 BP: 107/62 Patient Position (if appropriate): Lying Oxygen Therapy SpO2: 100 % Pain: pt denied pain during tx   ADL: ADL Eating: Minimal assistance Where Assessed-Eating: Wheelchair Grooming: Minimal assistance Where Assessed-Grooming: Wheelchair Upper Body Bathing: Moderate assistance Where Assessed-Upper Body Bathing: Wheelchair Lower Body Bathing: Dependent Where Assessed-Lower Body Bathing: Wheelchair,Sitting at sink,Standing at sink Upper Body Dressing: Maximal assistance Where Assessed-Upper Body Dressing: Wheelchair Lower Body Dressing: Dependent Where Assessed-Lower Body Dressing: [place] at sink,Standing at sink Toileting: Dependent Where Assessed-Toileting: Research scientist (life sciences) Method: Arts development officer: Bedside commode,Grab bars Tub/Shower Transfer: Not assessed Social research officer, government: Not assessed      Therapy/Group: Individual Therapy  Caide Campi A Jeryl Umholtz 02/15/2021, 4:08 PM

## 2021-02-16 DIAGNOSIS — R350 Frequency of micturition: Secondary | ICD-10-CM

## 2021-02-16 LAB — URINALYSIS, COMPLETE (UACMP) WITH MICROSCOPIC
Bacteria, UA: NONE SEEN
Bilirubin Urine: NEGATIVE
Glucose, UA: NEGATIVE mg/dL
Hgb urine dipstick: NEGATIVE
Ketones, ur: NEGATIVE mg/dL
Leukocytes,Ua: NEGATIVE
Nitrite: NEGATIVE
Protein, ur: NEGATIVE mg/dL
Specific Gravity, Urine: 1.013 (ref 1.005–1.030)
pH: 5 (ref 5.0–8.0)

## 2021-02-16 NOTE — Progress Notes (Signed)
Continent & incontinent of urine. Requests to use urinal, but usually already incontinent.  BLE edema. Denies pain. Matthew Wise

## 2021-02-16 NOTE — Progress Notes (Signed)
PROGRESS NOTE   Subjective/Complaints: No new complaints. Nursing reported frequency/incontinence at times.  Low grade temp  ROS: Limited due to cognitive/behavioral    Objective:   No results found. No results for input(s): WBC, HGB, HCT, PLT in the last 72 hours. No results for input(s): NA, K, CL, CO2, GLUCOSE, BUN, CREATININE, CALCIUM in the last 72 hours.  Intake/Output Summary (Last 24 hours) at 02/16/2021 1026 Last data filed at 02/16/2021 0835 Gross per 24 hour  Intake 1080 ml  Output 850 ml  Net 230 ml     Pressure Injury 02/12/21 Penis Mid Stage 2 -  Partial thickness loss of dermis presenting as a shallow open injury with a red, pink wound bed without slough. Pink granulation area from pressure of condom cath that was too small. (Active)  02/12/21 1454  Location: Penis  Location Orientation: Mid  Staging: Stage 2 -  Partial thickness loss of dermis presenting as a shallow open injury with a red, pink wound bed without slough.  Wound Description (Comments): Pink granulation area from pressure of condom cath that was too small.  Present on Admission: Yes    Physical Exam: Vital Signs Blood pressure 122/77, pulse (!) 57, temperature 98.5 F (36.9 C), resp. rate 18, height 6' (1.829 m), weight 117.6 kg, SpO2 99 %.  Constitutional: No distress . Vital signs reviewed. HEENT: EOMI, oral membranes moist Neck: supple Cardiovascular: RRR without murmur. No JVD    Respiratory/Chest: CTA Bilaterally without wheezes or rales. Normal effort    GI/Abdomen: BS +, non-tender, non-distended Ext: no clubbing, cyanosis, or edema Psych: pleasant and cooperative Skin: No evidence of breakdown, no evidence of rash Neurologic: Cranial nerves II through XII intact, motor strength is 5/5 in Right deltoid, bicep, tricep, grip, hip flexor, knee extensors, ankle dorsiflexor and plantar flexor Sensory exam reduced sensation to LT LUE  and LLE --stable exam Musculoskeletal: Full range of motion in all 4 extremities. No joint swelling    Assessment/Plan: 1. Functional deficits which require 3+ hours per day of interdisciplinary therapy in a comprehensive inpatient rehab setting.  Physiatrist is providing close team supervision and 24 hour management of active medical problems listed below.  Physiatrist and rehab team continue to assess barriers to discharge/monitor patient progress toward functional and medical goals  Care Tool:  Bathing    Body parts bathed by patient: Left arm,Chest,Abdomen,Right upper leg,Front perineal area,Left upper leg,Right lower leg,Face   Body parts bathed by helper: Left lower leg,Right arm,Buttocks     Bathing assist Assist Level: Total Assistance - Patient < 25%     Upper Body Dressing/Undressing Upper body dressing   What is the patient wearing?: Pull over shirt    Upper body assist Assist Level: Maximal Assistance - Patient 25 - 49%    Lower Body Dressing/Undressing Lower body dressing      What is the patient wearing?: Incontinence brief,Pants     Lower body assist Assist for lower body dressing: Total Assistance - Patient < 25%     Toileting Toileting    Toileting assist Assist for toileting: Maximal Assistance - Patient 25 - 49%     Transfers Chair/bed transfer  Transfers assist  Chair/bed transfer assist level: Maximal Assistance - Patient 25 - 49%     Locomotion Ambulation   Ambulation assist   Ambulation activity did not occur: Safety/medical concerns          Walk 10 feet activity   Assist  Walk 10 feet activity did not occur: Safety/medical concerns        Walk 50 feet activity   Assist Walk 50 feet with 2 turns activity did not occur: Safety/medical concerns         Walk 150 feet activity   Assist Walk 150 feet activity did not occur: Safety/medical concerns         Walk 10 feet on uneven surface   activity   Assist Walk 10 feet on uneven surfaces activity did not occur: Safety/medical concerns         Wheelchair     Assist Will patient use wheelchair at discharge?:  (TBD)             Wheelchair 50 feet with 2 turns activity    Assist            Wheelchair 150 feet activity     Assist          Blood pressure 122/77, pulse (!) 57, temperature 98.5 F (36.9 C), resp. rate 18, height 6' (1.829 m), weight 117.6 kg, SpO2 99 %.   Medical Problem List and Plan: 1.  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  Antithrombotics: -DVT/anticoagulation: Bilateral lower extremity DVT/PE in the setting of hypercoagulopathy disorder.  Continue Pradaxa.             -antiplatelet therapy: Aspirin 81 mg daily 3. Pain Management: Tylenol as needed 4. Mood: Provide emotional support             -antipsychotic agents: N/A 5. Neuropsych: This patient is capable of making decisions on his own behalf. 6. Skin/Wound Care: Routine skin checks 7. Fluids/Electrolytes/Nutrition: Routine in and outs             CMP ordered 8.  Seizure disorder.  Keppra 500 mg twice daily 9.  Hypertension.  Lisinopril 40 mg daily, hydralazine 75 mg every 8 hours, Norvasc 10 mg daily.      Vitals:   02/16/21 0350 02/16/21 0712  BP: 103/69 122/77  Pulse: 65 (!) 57  Resp: 18   Temp: 98.5 F (36.9 C)   SpO2: 99%   controlled 3/20 10.  Hypothyroidism.  Synthroid 11.  CKD stage III.  Baseline creatinine 1.83.  Improved 1.31 on 3/17             CMP ordered for tomorrow 12.  History of tobacco abuse.  Provide counseling 13.  Hyperlipidemia: Lipitor 14. Urinary frequency/incontinence:  3/20 -timed voids    -check ua, ucx     LOS: 4 days A FACE TO FACE EVALUATION WAS PERFORMED  Meredith Staggers 02/16/2021, 10:26 AM

## 2021-02-17 ENCOUNTER — Inpatient Hospital Stay: Payer: Medicare Other | Admitting: Neurology

## 2021-02-17 LAB — BASIC METABOLIC PANEL
Anion gap: 8 (ref 5–15)
BUN: 15 mg/dL (ref 6–20)
CO2: 28 mmol/L (ref 22–32)
Calcium: 10.1 mg/dL (ref 8.9–10.3)
Chloride: 102 mmol/L (ref 98–111)
Creatinine, Ser: 1.72 mg/dL — ABNORMAL HIGH (ref 0.61–1.24)
GFR, Estimated: 46 mL/min — ABNORMAL LOW (ref >60)
Glucose, Bld: 100 mg/dL — ABNORMAL HIGH (ref 70–99)
Potassium: 3.6 mmol/L (ref 3.5–5.1)
Sodium: 138 mmol/L (ref 135–145)

## 2021-02-17 LAB — CBC
HCT: 39.5 % (ref 39.0–52.0)
Hemoglobin: 13.6 g/dL (ref 13.0–17.0)
MCH: 28.8 pg (ref 26.0–34.0)
MCHC: 34.4 g/dL (ref 30.0–36.0)
MCV: 83.5 fL (ref 80.0–100.0)
Platelets: 297 10*3/uL (ref 150–400)
RBC: 4.73 MIL/uL (ref 4.22–5.81)
RDW: 13.2 % (ref 11.5–15.5)
WBC: 5 10*3/uL (ref 4.0–10.5)
nRBC: 0 % (ref 0.0–0.2)

## 2021-02-17 MED ORDER — SALINE SPRAY 0.65 % NA SOLN
1.0000 | NASAL | Status: DC | PRN
  Administered 2021-02-17 – 2021-02-19 (×2): 1 via NASAL
  Filled 2021-02-17: qty 44

## 2021-02-17 NOTE — Progress Notes (Signed)
PROGRESS NOTE   Subjective/Complaints:  No issues overnite Spoke with OT regarding increased tone  ROS: Limited due to cognitive/behavioral    Objective:   No results found. Recent Labs    02/17/21 0646  WBC 5.0  HGB 13.6  HCT 39.5  PLT 297   No results for input(s): NA, K, CL, CO2, GLUCOSE, BUN, CREATININE, CALCIUM in the last 72 hours.  Intake/Output Summary (Last 24 hours) at 02/17/2021 0800 Last data filed at 02/17/2021 4235 Gross per 24 hour  Intake 1260 ml  Output 800 ml  Net 460 ml     Pressure Injury 02/12/21 Penis Mid Stage 2 -  Partial thickness loss of dermis presenting as a shallow open injury with a red, pink wound bed without slough. Pink granulation area from pressure of condom cath that was too small. (Active)  02/12/21 1454  Location: Penis  Location Orientation: Mid  Staging: Stage 2 -  Partial thickness loss of dermis presenting as a shallow open injury with a red, pink wound bed without slough.  Wound Description (Comments): Pink granulation area from pressure of condom cath that was too small.  Present on Admission: Yes    Physical Exam: Vital Signs Blood pressure 105/76, pulse (!) 57, temperature 98.3 F (36.8 C), resp. rate 18, height 6' (1.829 m), weight 117.6 kg, SpO2 99 %.  General: No acute distress Mood and affect are appropriate Heart: Regular rate and rhythm no rubs murmurs or extra sounds Lungs: Clear to auscultation, breathing unlabored, no rales or wheezes Abdomen: Positive bowel sounds, soft nontender to palpation, nondistended Extremities: No clubbing, cyanosis, or edema Skin: No evidence of breakdown, no evidence of rash Neurologic: Cranial nerves II through XII intact, motor strength is 5/5 in Right deltoid, bicep, tricep, grip, hip flexor, knee extensors, ankle dorsiflexor and plantar flexor Sensory exam reduced sensation to LT LUE and LLE --stable exam Musculoskeletal:  Full range of motion in all 4 extremities. No joint swelling    Assessment/Plan: 1. Functional deficits which require 3+ hours per day of interdisciplinary therapy in a comprehensive inpatient rehab setting.  Physiatrist is providing close team supervision and 24 hour management of active medical problems listed below.  Physiatrist and rehab team continue to assess barriers to discharge/monitor patient progress toward functional and medical goals  Care Tool:  Bathing    Body parts bathed by patient: Left arm,Chest,Abdomen,Right upper leg,Front perineal area,Left upper leg,Right lower leg,Face   Body parts bathed by helper: Left lower leg,Right arm,Buttocks     Bathing assist Assist Level: Total Assistance - Patient < 25%     Upper Body Dressing/Undressing Upper body dressing   What is the patient wearing?: Pull over shirt    Upper body assist Assist Level: Maximal Assistance - Patient 25 - 49%    Lower Body Dressing/Undressing Lower body dressing      What is the patient wearing?: Incontinence brief,Pants     Lower body assist Assist for lower body dressing: Total Assistance - Patient < 25%     Toileting Toileting    Toileting assist Assist for toileting: Maximal Assistance - Patient 25 - 49%     Transfers Chair/bed transfer  Transfers  assist     Chair/bed transfer assist level: Maximal Assistance - Patient 25 - 49%     Locomotion Ambulation   Ambulation assist   Ambulation activity did not occur: Safety/medical concerns          Walk 10 feet activity   Assist  Walk 10 feet activity did not occur: Safety/medical concerns        Walk 50 feet activity   Assist Walk 50 feet with 2 turns activity did not occur: Safety/medical concerns         Walk 150 feet activity   Assist Walk 150 feet activity did not occur: Safety/medical concerns         Walk 10 feet on uneven surface  activity   Assist Walk 10 feet on uneven surfaces  activity did not occur: Safety/medical concerns         Wheelchair     Assist Will patient use wheelchair at discharge?:  (TBD)             Wheelchair 50 feet with 2 turns activity    Assist            Wheelchair 150 feet activity     Assist          Blood pressure 105/76, pulse (!) 57, temperature 98.3 F (36.8 C), resp. rate 18, height 6' (1.829 m), weight 117.6 kg, SpO2 99 %.   Medical Problem List and Plan: 1.  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  Antithrombotics: -DVT/anticoagulation: Bilateral lower extremity DVT/PE in the setting of hypercoagulopathy disorder.  Continue Pradaxa.CBC ok             -antiplatelet therapy: Aspirin 81 mg daily 3. Pain Management: Tylenol as needed 4. Mood: Provide emotional support             -antipsychotic agents: N/A 5. Neuropsych: This patient is capable of making decisions on his own behalf. 6. Skin/Wound Care: Routine skin checks 7. Fluids/Electrolytes/Nutrition: Routine in and outs             CMP ordered 8.  Seizure disorder.  Keppra 500 mg twice daily 9.  Hypertension.  Lisinopril 40 mg daily, hydralazine 75 mg every 8 hours, Norvasc 10 mg daily.      Vitals:   02/17/21 0409 02/17/21 0724  BP: 116/75 105/76  Pulse: 64 (!) 57  Resp: 18   Temp: 98.3 F (36.8 C)   SpO2: 99%   controlled 3/21 10.  Hypothyroidism.  Synthroid 11.  CKD stage III.  Baseline creatinine 1.83.  Improved 1.31 on 3/17             CMP ordered for tomorrow 12.  History of tobacco abuse.  Provide counseling 13.  Hyperlipidemia: Lipitor 14. Urinary frequency/incontinence:  3/20 -timed voids    -check ua, ucx    LOS: 5 days A FACE TO  FACE EVALUATION WAS PERFORMED  Charlett Blake 02/17/2021, 8:00 AM

## 2021-02-17 NOTE — Progress Notes (Signed)
Orthopedic Tech Progress Note Patient Details:  JONTRELL BUSHONG January 22, 1961 [id number] Called in order to [facility] for a REHAB COMBO Patient ID: LARRIE FRAIZER, male   DOB: [birth year], 60 y.o.   MRN: [id number]   Janit Pagan 02/17/2021, 8:39 AM

## 2021-02-17 NOTE — Progress Notes (Signed)
Physical Therapy Session Note  Patient Details  Name: Matthew Wise MRN: [id number] Date of Birth: Oct 30, 1961  Today's Date: 02/17/2021 PT Individual Time: 2778-2423 and 1355-1448 PT Individual Time Calculation (min): 56 min and 53 min  Short Term Goals: Week 1:  PT Short Term Goal 1 (Week 1): Pt will perform supine<>sit with mod assist PT Short Term Goal 2 (Week 1): Pt will perform bed<>chair transfers with mod assist PT Short Term Goal 3 (Week 1): Pt will perform sit<>stands using LRAD with +2 mod assist PT Short Term Goal 4 (Week 1): Pt will initiate gait training  Skilled Therapeutic Interventions/Progress Updates:     Session 1: Pt received sitting in WC. Denies pain and agreeable to therapy. Wheeled to therapy gym and transferred to mat L squat pivot max assist with cuing for head-hips relationship and anterior trunk weight shift. Transferred to standing mod assist facilitating LLE positioning, cuing for LLE hip/knee ext. Pre-gait activity LLE weight bearing with RLE stepping forward and backward 3x5 steps mod assist for balance with +2 for safety. During static stance, mirror feedback and verbal cuing to shift hips to the right and stay upright and at midline. Pt demonstrated enough success to initiate walking. Gait training 3x15' +2 with "3 musketeers" with +3 WC follow and leg lifter used on L leg due to motor involvement; pt able to advance LLE 90% of time without assist. Same mirror feedback as with pre-gait activity. Verbal cuing to "bring L butt forward when stepping into left leg" to facilitate L hip ext and translate pelvis over stance leg. Guarding of L knee in event of buckle (no buckling occurred though). Pt demonstrated difficulty shifting onto R leg during stance. L knee hyperextension also observed, and addressed via L knee flex facilitated on 3rd set. Pt wheeled back to room and transferred to EOB squat pivot max assist, and into supine max assist. Pt left with needs within  reach and bed alarm turned on. Pt continues to have consistent congestion; PA notified to address this.  Session 2: Pt received asleep in supine. Very tired but agreeable to therapy and denies pain. Transferred to L sidelying max assist with verbal and tactile cuing to bend R knee and push with R leg, and pull on L bed rail with R UE. Transferred to sitting EOB max assist with verbal cuing to push up with R UE. Transferred to WC squat pivot max assist; pt L leg kept in neutral with PT leg to avoid excess hip ER. Pt continued to have congestion from morning session, but RN entered to give nasal spray to address this issue. Pt wheeled to therapy gym. Transferred to mat R squat pivot max assist with verbal cuing to anterior trunk weight shift and RUE placement on mat. Sitting balance with focus on L sided inattention via clothes pins placement on L side of pt and instructed to reach with RUE and place pins one-by-one on rack on R side. LUE placement on mat to facilitate functional WB and NMR, and tactile cuing to facilitate elbow ext. Verbal cuing to stay upright and keep shoulders back in between reps to address significant anterior shoulder/trunk muscle tightness (~20 reps completed). LUE AROM attempted via reaching towards PT hand, but unsuccessful due to minimal movement present. Transferred to [place] L squat pivot max assist and wheeled back to room. Left in WC due to limited time with nursing called to assist with bed transfer. Pt left tilted back in TIS WC with seat belt alarm  turned on and needs within reach.  Therapy Documentation Precautions:  Precautions Precautions: Fall,Other (comment) Precaution Comments: L hemiplegia, left neglect, hx of seizures, age indeterminate B LE DVTs Restrictions Weight Bearing Restrictions: No      Therapy/Group: Individual Therapy  Eleonore Chiquito, SPT 02/17/2021, 12:16 PM

## 2021-02-17 NOTE — Progress Notes (Signed)
Doesn't sleep good at night. Patient reports he normally doesn't go to sleep until 0300, then sleeps until 1000. May need to add sleep aid.  BLE edema, Left > right. Attempted to time toilet, but usually incontinent after calling for assistance. Denies pain. [name]

## 2021-02-17 NOTE — Progress Notes (Signed)
Speech Language Pathology Daily Session Note  Patient Details  Name: Matthew Wise MRN: [id number] Date of Birth: [birth year]  Today's Date: 02/17/2021 SLP Individual Time: 1423-9532 SLP Individual Time Calculation (min): 40 min  Short Term Goals: Week 1: SLP Short Term Goal 1 (Week 1): Pt will demonsrate ability to use compensatory strategies to clear left buccal poceting and minimize left anterior loss of POs with Supervision A verbal cues. SLP Short Term Goal 2 (Week 1): Pt will demonstrate efficient mastication and oral clearance of regular textures a minimum of 2 sessions and no more than Min A cues required for oral clearance prior to advancement. SLP Short Term Goal 3 (Week 1): Pt will demonstrate ability to problem solve basic to mildly complex tasks with Mod A verbal/visual cuess. SLP Short Term Goal 4 (Week 1): Pt will recall new and/or daily information with Mod A cues. SLP Short Term Goal 5 (Week 1): Pt will attend to left side of his body and/or locate items to left of midline with Moderate cueing. SLP Short Term Goal 6 (Week 1): Pt will detect functional errors with Min A cues.  Skilled Therapeutic Interventions: Pt was seen for skilled ST targeting cognitive goals. SLP facilitated session with a semi-complex monthly calendar/scheduling task. Pt required Min to Mod A verbal cueing for problem solving, closer to Min A for awareness of errors in order to chart appointments, due dates, and other obligations on a monthly calendar. He also required verbal assistance to identify some key pieces of information to include on the appointment (ex: location, time, etc.). Overall, his cognitive processing speed remains slow, requiring extra time to complete tasks. Soft noise was in the background, and pt selectively attended with Min A verbal cues for redirection. Pt left sitting in wheelchair with alarm set and needs within reach. Continue per current plan of care.          Pain Pain  Assessment Pain Scale: 0-10 Pain Score: 0-No pain  Therapy/Group: Individual Therapy  Arbutus Leas 02/17/2021, 7:20 AM

## 2021-02-17 NOTE — Progress Notes (Signed)
Pt educated to use of hand splint and foot boot on left side. Pt compliant boot and splint applied

## 2021-02-17 NOTE — Progress Notes (Signed)
Occupational Therapy Session Note  Patient Details  Name: Matthew Wise MRN: [id number] Date of Birth: 04/02/1961  Today's Date: 02/17/2021 OT Individual Time: 7948-0165 OT Individual Time Calculation (min): 58 min    Short Term Goals: Week 1:  OT Short Term Goal 1 (Week 1): Pt will donn a pullover shirt with min assist and mod demonstrational cueing for hemi dressing technique. OT Short Term Goal 2 (Week 1): Pt was able to complete LB bathing with max assist sit to stand and use of AE PRN. OT Short Term Goal 3 (Week 1): Pt will complete squat pivot toilet transfer to drop arm commode with mod assist. OT Short Term Goal 4 (Week 1): Pt will donn pants with max assist sit to stand with use of AE PRN.  Skilled Therapeutic Interventions/Progress Updates:    Pt in bed to start session with agreement to work on showering task.  He was able to transfer to the EOB with mod assist and mod instructional cueing.  Increased tone noted in the pectoral with rolling to the left side.  The Charlaine Dalton was utilized for transfer to the shower bench for safety and time.  He needed mod assist for sit to stand in the [place] with max assist for removal of brief and min assist for removal of pullover shirt.  He completed shower with min assist for UB and max assist for LB sit to stand.  Max hand over hand was initiated for the LUE to wash the RUE and left leg.  Once he dried off, he was transferred out to the wheelchair with use of the [place].  Once again he was able to complete sit to stand with light mod assist.  Worked on dressing with min instructional cueing for hemi dressing techniques.  Min assist for donning a pullover shirt with max assist for brief, pants, TEDs, and gripper socks.  He was able to cross the LLE over the right knee and maintain with mod assist while threading garments over the feet.  Mod assist for sit to stand and pull items over his hips.  Finished session with oral hygiene with setup.  He was left in  the tilt in space wheelchair with call button and phone in reach and safety belt in place.    Therapy Documentation Precautions:  Precautions Precautions: Fall,Other (comment) Precaution Comments: L hemiplegia, left neglect, hx of seizures, age indeterminate B LE DVTs Restrictions Weight Bearing Restrictions: No  Pain: Pain Assessment Pain Scale: Faces Pain Score: 0-No pain ADL: See Care Tool Section for some details of mobility and selfcare  Therapy/Group: Individual Therapy  Shene Maxfield,Henning  OTR/L  02/17/2021, 10:18 AM

## 2021-02-18 LAB — URINE CULTURE: Culture: <10000 — AB

## 2021-02-18 MED ORDER — SORBITOL 70 % SOLN
30.0000 mL | Freq: Every day | Status: DC | PRN
  Administered 2021-02-18: 30 mL via ORAL
  Filled 2021-02-18: qty 30

## 2021-02-18 NOTE — Progress Notes (Signed)
Physical Therapy Session Note  Patient Details  Name: Matthew Wise MRN: [id number] Date of Birth: 05-Sep-1961  Today's Date: 02/18/2021 PT Individual Time: 0810-0920 PT Individual Time Calculation (min): 70 min   Short Term Goals: Week 1:  PT Short Term Goal 1 (Week 1): Pt will perform supine<>sit with mod assist PT Short Term Goal 2 (Week 1): Pt will perform bed<>chair transfers with mod assist PT Short Term Goal 3 (Week 1): Pt will perform sit<>stands using LRAD with +2 mod assist PT Short Term Goal 4 (Week 1): Pt will initiate gait training  Skilled Therapeutic Interventions/Progress Updates:    Pt received supine in bed reporting he had just called nursing for assistance with urinal. Pt agreeable to therapy session. Provided urinal and required cuing for proper positioning to ensure cleanliness - bed placed in upright trendelenburg to improve bladder emptying - pt required significantly increased time to void continently and noted to have small incontinence in brief. Rolling R with max assist and L with min/mod assist during total assist LB clothing management. Supine>sitting L EOB, HOB flat, mod assist for B LE management off EOB and trunk upright - cuing for sequencing to increase pt independence. R squat pivot EOB>w/c max assist for lifting/pivoting hips and cuing for head/hips relationship and therapist facilitating L LE WBing for NMR.  Transported to/from gym in w/c for time management and energy conservation. Sit<>stands to/from w/c having pt use R UE to support L UE to promote midline orientation (minimize R weight shift compensation) and increase L LE WBing for NMR with mod assist for 1 lifting/lowering and balance - cuing for increased trunk/hip extension. Gait training 2x~51ft with +2 max assist via 3 Musketeer support - pt able to advance L LE during swing with excessive hip external rotation and L knee hyperextension noted - continues to demo excessive L knee hyperextension during  stance with therapist facilitating slight flexion for quad activation - mirror feedback provided and multimodal cuing for R/L weightshift onto stance limb with pt noted to have significantly strong L lean during 2nd walk). Pt was able to advance to turning to sit on mat at end of walk as opposed to requiring wheelchair follow. Pt reports family planning to bring tennis shoes tomorrow. At end of session pt transported back to room and left tilted back in TIS w/c with needs in reach, seat belt alarm on, and L UE supported on pillow.   Therapy Documentation Precautions:  Precautions Precautions: Fall,Other (comment) Precaution Comments: L hemiplegia, left neglect, hx of seizures, age indeterminate B LE DVTs Restrictions Weight Bearing Restrictions: No  Pain: No reports of pain throughout session.   Therapy/Group: Individual Therapy  Tawana Scale , PT, DPT, CSRS  02/18/2021, 7:42 AM

## 2021-02-18 NOTE — Progress Notes (Signed)
Occupational Therapy Session Note  Patient Details  Name: Matthew Wise MRN: [id number] Date of Birth: 12/01/1960  Today's Date: 02/18/2021 OT Individual Time: 4665-9935 OT Individual Time Calculation (min): 76 min    Short Term Goals: Week 1:  OT Short Term Goal 1 (Week 1): Pt will donn a pullover shirt with min assist and mod demonstrational cueing for hemi dressing technique. OT Short Term Goal 2 (Week 1): Pt was able to complete LB bathing with max assist sit to stand and use of AE PRN. OT Short Term Goal 3 (Week 1): Pt will complete squat pivot toilet transfer to drop arm commode with mod assist. OT Short Term Goal 4 (Week 1): Pt will donn pants with max assist sit to stand with use of AE PRN.  Skilled Therapeutic Interventions/Progress Updates:    Pt in bed to start session with completion of supine to sit EOB with max assist.  He then completed transfer to the wheelchair at the same level stand/squat pivot to the right as well as to the therapy mat.  Worked on sit to stand and standing with mod assist to start and use of mirror for feedback.  Had pt work on weightshifting side to side in standing to increase weightbearing over the LLE as well as taking a small step forward with the RLE.  He progressed to walking with three muskateers technique distances of 14 ft and then 20 ft X2.  Total +2 (pt 35%) for completion of task with mod facilitation for advancement and placement of the LLE as well as mod facilitation in weightbearing to control the left knee.  No significant knee buckling noted, however pt with moderate external rotation of the LLE when trying to advance it and place it.  Transitioned to supine for stretching of internal rotators and elbow flexors of the left arm.  Noted increased tone in these areas at 2/4 on the Modified [place] Scale.  Educated pt on self PROM for shoulder flexion and elbow flexion/extension when in the bed and in the wheelchair.  Mod demonstrational cueing  needed to complete 5-6 repetitions for each.  Also educated pt on rolling to the right side with self positioning of the LUE as well as having someone assist with flexing up the LLE.  Max assist needed for rolling to the right on the mat and mod assist to transition to sitting.  Finished session with return to the wheelchair at [facility] assist level squat pivot and then back to the room.  Call button and phone in reach with safety belt in place.    Therapy Documentation Precautions:  Precautions Precautions: Fall,Other (comment) Precaution Comments: L hemiplegia, left neglect, hx of seizures, age indeterminate B LE DVTs Restrictions Weight Bearing Restrictions: No   Pain: Pain Assessment Pain Scale: Faces Pain Score: 0-No pain ADL: See Care Tool Section for some details of mobility and selfcare  Therapy/Group: Individual Therapy  Harly Pipkins,Jaydrien OTR/L 02/18/2021, 4:05 PM

## 2021-02-18 NOTE — Progress Notes (Signed)
PROGRESS NOTE   Subjective/Complaints: Wearing PRAFO and WHO  No issues overnight, has been constipated , discussed with RN who req sorbitol order  No abd pain   ROS: Limited due to cognitive/behavioral    Objective:   No results found. Recent Labs    02/17/21 0646  WBC 5.0  HGB 13.6  HCT 39.5  PLT 297   Recent Labs    02/17/21 0646  NA 138  K 3.6  CL 102  CO2 28  GLUCOSE 100*  BUN 15  CREATININE 1.72*  CALCIUM 10.1    Intake/Output Summary (Last 24 hours) at 02/18/2021 0715 Last data filed at 02/18/2021 0500 Gross per 24 hour  Intake 480 ml  Output 600 ml  Net -120 ml     Pressure Injury 02/12/21 Penis Mid Stage 2 -  Partial thickness loss of dermis presenting as a shallow open injury with a red, pink wound bed without slough. Pink granulation area from pressure of condom cath that was too small. (Active)  02/12/21 1454  Location: Penis  Location Orientation: Mid  Staging: Stage 2 -  Partial thickness loss of dermis presenting as a shallow open injury with a red, pink wound bed without slough.  Wound Description (Comments): Pink granulation area from pressure of condom cath that was too small.  Present on Admission: Yes    Physical Exam: Vital Signs Blood pressure 107/77, pulse 64, temperature 98.2 F (36.8 C), resp. rate 20, height 6' (1.829 m), weight 117.6 kg, SpO2 100 %.  General: No acute distress Mood and affect are appropriate Heart: Regular rate and rhythm no rubs murmurs or extra sounds Lungs: Clear to auscultation, breathing unlabored, no rales or wheezes Abdomen: Positive bowel sounds, soft nontender to palpation, nondistended Extremities: No clubbing, cyanosis, or edema Skin: No evidence of breakdown, no evidence of rash  Neurologic: Cranial nerves II through XII intact, motor strength is 5/5 in Right deltoid, bicep, tricep, grip, hip flexor, knee extensors, ankle dorsiflexor and  plantar flexor 0/5 LUE and LLE Tone - MAS 3 in L pec, L biceps Sensory exam reduced sensation to LT LUE and LLE --stable exam Musculoskeletal: Full range of motion in all 4 extremities. No joint swelling    Assessment/Plan: 1. Functional deficits which require 3+ hours per day of interdisciplinary therapy in a comprehensive inpatient rehab setting.  Physiatrist is providing close team supervision and 24 hour management of active medical problems listed below.  Physiatrist and rehab team continue to assess barriers to discharge/monitor patient progress toward functional and medical goals  Care Tool:  Bathing    Body parts bathed by patient: Left arm,Chest,Abdomen,Right upper leg,Front perineal area,Left upper leg,Right lower leg,Face   Body parts bathed by helper: Right arm,Buttocks,Left lower leg     Bathing assist Assist Level: Maximal Assistance - Patient 24 - 49%     Upper Body Dressing/Undressing Upper body dressing   What is the patient wearing?: Pull over shirt    Upper body assist Assist Level: Minimal Assistance - Patient > 75%    Lower Body Dressing/Undressing Lower body dressing      What is the patient wearing?: Incontinence brief,Pants     Lower  body assist Assist for lower body dressing: Maximal Assistance - Patient 25 - 49%     Toileting Toileting    Toileting assist Assist for toileting: Maximal Assistance - Patient 25 - 49%     Transfers Chair/bed transfer  Transfers assist     Chair/bed transfer assist level: Maximal Assistance - Patient 25 - 49%     Locomotion Ambulation   Ambulation assist   Ambulation activity did not occur: Safety/medical concerns  Assist level: 2 helpers (+2 max assist 3 muskateer with 3rd person WC follow)   Max distance: 15   Walk 10 feet activity   Assist  Walk 10 feet activity did not occur: Safety/medical concerns  Assist level: 2 helpers     Walk 50 feet activity   Assist Walk 50 feet with 2  turns activity did not occur: Safety/medical concerns         Walk 150 feet activity   Assist Walk 150 feet activity did not occur: Safety/medical concerns         Walk 10 feet on uneven surface  activity   Assist Walk 10 feet on uneven surfaces activity did not occur: Safety/medical concerns         Wheelchair     Assist Will patient use wheelchair at discharge?: Yes (Yes per PT long term goals) Type of Wheelchair: Manual Wheelchair activity did not occur: Safety/medical concerns         Wheelchair 50 feet with 2 turns activity    Assist            Wheelchair 150 feet activity     Assist          Blood pressure 107/77, pulse 64, temperature 98.2 F (36.8 C), resp. rate 20, height 6' (1.829 m), weight 117.6 kg, SpO2 100 %.   Medical Problem List and Plan: 1.  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  Antithrombotics: -DVT/anticoagulation: Bilateral lower extremity DVT/PE in the setting of hypercoagulopathy disorder.  Continue Pradaxa.CBC ok             -antiplatelet therapy: Aspirin 81 mg daily 3. Pain Management: Tylenol as needed 4. Mood: Provide emotional support             -antipsychotic agents: N/A 5. Neuropsych: This patient is capable of making decisions on his own behalf. 6. Skin/Wound Care: Routine skin checks 7. Fluids/Electrolytes/Nutrition: Routine in and outs             CMP ordered 8.  Seizure disorder.  Keppra 500 mg twice daily 9.  Hypertension.  Lisinopril 40 mg daily, hydralazine 75 mg every 8 hours, Norvasc 10 mg daily.      Vitals:   02/17/21 2005 02/18/21 0531  BP: 116/70 107/77  Pulse: 64 64  Resp: 18 20  Temp: 98.4 F (36.9 C) 98.2  F (36.8 C)  SpO2: 99% 100%  controlled 3/22 10.  Hypothyroidism.  Synthroid 11.  CKD stage III.  Baseline creatinine 1.83.  Improved 1.31 on 3/17  CMP ordered for tomorrow 12.  History of tobacco abuse.  Provide counseling 13.  Hyperlipidemia: Lipitor 14. Urinary frequency/incontinence:  3/20 -timed voids    -UA neg, C and S pnd    LOS: 6 days A FACE TO FACE EVALUATION WAS PERFORMED  Charlett Blake 02/18/2021, 7:15 AM

## 2021-02-18 NOTE — Progress Notes (Signed)
Speech Language Pathology Daily Session Note  Patient Details  Name: Matthew Wise MRN: [id number] Date of Birth: 09/28/1961  Today's Date: 02/18/2021 SLP Individual Time: 1002-1043 SLP Individual Time Calculation (min): 41 min  Short Term Goals: Week 1: SLP Short Term Goal 1 (Week 1): Pt will demonsrate ability to use compensatory strategies to clear left buccal poceting and minimize left anterior loss of POs with Supervision A verbal cues. SLP Short Term Goal 2 (Week 1): Pt will demonstrate efficient mastication and oral clearance of regular textures a minimum of 2 sessions and no more than Min A cues required for oral clearance prior to advancement. SLP Short Term Goal 3 (Week 1): Pt will demonstrate ability to problem solve basic to mildly complex tasks with Mod A verbal/visual cuess. SLP Short Term Goal 4 (Week 1): Pt will recall new and/or daily information with Mod A cues. SLP Short Term Goal 5 (Week 1): Pt will attend to left side of his body and/or locate items to left of midline with Moderate cueing. SLP Short Term Goal 6 (Week 1): Pt will detect functional errors with Min A cues.  Skilled Therapeutic Interventions: Pt was seen for skilled ST targeting dysphagia and cognitive goals. SLP facilitated session with an upgraded trial of regular textured solids with thin liquids. Pt verbally recalled 2/3 compensatory swallow strategies related to clearance of buccal pocketing, but required Min A verbal cues for awareness of presence of left buccal pocketing and effective use of the strategies for full clearance. Mild left anterior loss of soli POs also noted, use of straw prevents anterior loss of liquids. Although pocketing is mild, pt still needs consistent cueing for clearance. Recommend continue current diet for now and trials of regular with SLP only to work toward greater independence with use of compensatory swallow strategies and eventually solid advancement as appropriate. SLP further  facilitated session with a PEG board task in which pt recreated basic to mildly complex designs from photos on the board in front of him, and for initiation/attentoin to tasks. He required Min A verbal cues to locate PEGs intentionally placed on left side of his visual field, as well as for problem solving. Increased Moderate verbal and visual cueing was required for awareness of errors. Pt's cognitive processing is still somewhat delayed. Pt left sitting in tilt in space chair with alarm set and needs met to his satisfaction, call bell at his side. Continue per current plan of care.         Pain Pain Assessment Pain Scale: 0-10 Pain Score: 0-No pain  Therapy/Group: Individual Therapy  Arbutus Leas 02/18/2021, 7:24 AM

## 2021-02-19 ENCOUNTER — Ambulatory Visit (INDEPENDENT_AMBULATORY_CARE_PROVIDER_SITE_OTHER): Payer: Medicare Other | Admitting: Internal Medicine

## 2021-02-19 MED ORDER — FLUTICASONE PROPIONATE 50 MCG/ACT NA SUSP
1.0000 | Freq: Every day | NASAL | Status: DC
  Administered 2021-02-19 – 2021-03-12 (×22): 1 via NASAL
  Filled 2021-02-19: qty 16

## 2021-02-19 MED ORDER — BACLOFEN 5 MG HALF TABLET
5.0000 mg | ORAL_TABLET | Freq: Three times a day (TID) | ORAL | Status: DC
  Administered 2021-02-19 – 2021-02-20 (×3): 5 mg via ORAL
  Filled 2021-02-19 (×3): qty 1

## 2021-02-19 NOTE — Patient Care Conference (Signed)
Inpatient RehabilitationTeam Conference and Plan of Care Update Date: 02/19/2021   Time: 10:41 AM    Patient Name: Matthew Wise      Medical Record Number: [id number]  Date of Birth: 04-15-1961 Sex: Male         Room/Bed: 4W19C/4W19C-01 Payor Info: Payor: [employer] MEDICARE / Plan: [facility] MEDICARE / Product Type: *No Product type* /    Admit Date/Time:  02/12/2021  1:59 PM  Primary Diagnosis:  Right middle cerebral artery stroke [facility])  Hospital Problems: Principal Problem:   Right middle cerebral artery stroke [facility]) Active Problems:   Pressure injury of skin    Expected Discharge Date: Expected Discharge Date: 03/12/21  Team Members Present: Physician leading conference: Dr. Leeroy Cha Care Coodinator Present: Dorien Chihuahua, RN, BSN, CRRN;Christina [place], [place] Nurse Present: Dorien Chihuahua, RN PT Present: Page Spiro, PT OT Present: Clyda Greener, OT PPS Coordinator present : Ileana Ladd, PT     Current Status/Progress Goal Weekly Team Focus  Bowel/Bladder   Continent of bowel and bladder, spills urnial in bed  LBM 03/22  pt will remain continent and have less spillage of urinal with improved fine motor skills  toiletr pt prn   Swallow/Nutrition/ Hydration   Dys 3 textures, thin liiquids, Min A  Mod I  strategies for clearance of left pocketing, regular trials   ADL's   Mod assist for UB selfcare with max assist for LB selfcare sit to stand.  Mod assist squat pivot transfers with max assist to total for stand pivot.  Left neglect with severe L hemiparesis at [place] stage II in the arm and stage I in the hand.  supervision to min assist  selfcare retraining, balance retraining, transfer training, DME education, pt education, neuromuscular re-education   Mobility   max assist bed mobility, max assist sit<>stand and squat pivot transfers, +2 max assist gait up to 55ft with 3 Musketeer support, L inattention  min assist at ambulatory level  bed mobility,  transfer training, L LE NMR, L attention, gait training, activity tolerance, pt education, standing balance, sitting balance   Communication             Safety/Cognition/ Behavioral Observations  Min-Mod depending on task and complexity, fatigue level  Supervision  semi-complex problem solving, awareness of functional errors, attention to tasks, recall/carryover of new info   Pain   denies  free of pain  assess pain qshift and prn check for effectiveness   Skin   bilateral shins dry flakey  skin remain intact and free of breakdown or infection  assess skin qshift and prn     Discharge Planning:  pt to d/c home with family to provide care (no much physical A)   Team Discussion: HTN and CKD controlled. Constipation addressed and timed toileting protocol in use. RN to address urgency with MD; UA testing requested. Stage 2 skin wound healing.   Patient on target to meet rehab goals: yes, mod assist for upper body bathing and dressing and max assist for lower body care. Mod assist for squat pivot transfers and able to ambulate 20" ; three musketeer style with mod - max assist. Min assist goals set for discharge. Progress limited by left inattention, left neglect, tone in biceps and left knee buckles.   *See Care Plan and progress notes for long and short-term goals.   Revisions to Treatment Plan:   Teaching Needs: Transfers, toileting, medications, secondary stroke risk management , etc.   Current Barriers to Discharge: Decreased caregiver  support and Home enviroment access/layout  Limited assistance and 2 step entry to home  Possible Resolutions to Barriers: Handrail installation at best; recommend ramp installation over entry steps Family education     Medical Summary Current Status: HTN is currently well controlled, Cr being monitored given his CKD, wearing WHO and PRAFO, receiving timed voiding and is now continent  Barriers to Discharge: Medical stability  Barriers to  Discharge Comments: stage III CKD, HTN, increased urinary frequency, DVT/PE     Continued Need for Acute Rehabilitation Level of Care: The patient requires daily medical management by a physician with specialized training in physical medicine and rehabilitation for the following reasons: Direction of a multidisciplinary physical rehabilitation program to maximize functional independence : Yes Medical management of patient stability for increased activity during participation in an intensive rehabilitation regime.: Yes Analysis of laboratory values and/or radiology reports with any subsequent need for medication adjustment and/or medical intervention. : Yes   I attest that I was present, lead the team conference, and concur with the assessment and plan of the team.   Dorien Chihuahua B 02/19/2021, 2:44 PM

## 2021-02-19 NOTE — Progress Notes (Signed)
Patient ID: Matthew Wise, male   DOB: September 09, 1961, 60 y.o.   MRN: [id number] Team Conference Report to Patient/Family  Team Conference discussion was reviewed with the patient and caregiver, including goals, any changes in plan of care and target discharge date.  Patient and caregiver express understanding and are in agreement.  The patient has a target discharge date of 03/12/21.  SW provided pt with updates. Called patient sister at bedside, no answer-left VM. SW waiting to follow up with pt sister to make sure she can handle patient's LOC at home. SW will continue to follow up.   Dyanne Iha 02/19/2021, 1:33 PM

## 2021-02-19 NOTE — Progress Notes (Signed)
Speech Language Pathology Daily Session Note  Patient Details  Name: Matthew Wise MRN: [id number] Date of Birth: 1960/12/18  Today's Date: 02/19/2021 SLP Individual Time: 0730-0758 SLP Individual Time Calculation (min): 28 min  Short Term Goals: Week 1: SLP Short Term Goal 1 (Week 1): Pt will demonsrate ability to use compensatory strategies to clear left buccal poceting and minimize left anterior loss of POs with Supervision A verbal cues. SLP Short Term Goal 2 (Week 1): Pt will demonstrate efficient mastication and oral clearance of regular textures a minimum of 2 sessions and no more than Min A cues required for oral clearance prior to advancement. SLP Short Term Goal 3 (Week 1): Pt will demonstrate ability to problem solve basic to mildly complex tasks with Mod A verbal/visual cuess. SLP Short Term Goal 4 (Week 1): Pt will recall new and/or daily information with Mod A cues. SLP Short Term Goal 5 (Week 1): Pt will attend to left side of his body and/or locate items to left of midline with Moderate cueing. SLP Short Term Goal 6 (Week 1): Pt will detect functional errors with Min A cues.  Skilled Therapeutic Interventions: Pt was seen for skilled ST targeting dysphagia and cognitive goals. SLP facilitated session with set up assistance and Min A verbal cues for use of compensatory swallowing strategies to clear minimal left buccal pocketing during intake of current diet - dys 3 (mech soft) and thin liquid breakfast tray. SLP also provided Min A verbal and visual cues for comprehension (due to slow processing) and for redirection in order for pt to alternate his attention between RN's requests while in room (related to taking vitals and meds, AM assessment, etc.) as well as SLP's requests in regard to repositioning and his breakfast/tray set up/locating items on left tray/etc. Overall Min A verbal and visual cueing was required for pt to locate items on left side of tray during intake, as well  as for awareness that he was pushing food off of the left side of his plate during self-feeding. Pt left sitting upright in bed with alarm set and needs within reach. Continue per current plan of care.          Pain Pain Assessment Pain Scale: 0-10 Pain Score: 0-No pain  Therapy/Group: Individual Therapy  Arbutus Leas 02/19/2021, 7:26 AM

## 2021-02-19 NOTE — Progress Notes (Signed)
Physical Therapy Session Note  Patient Details  Name: Matthew Wise MRN: [id number] Date of Birth: 1961-07-07  Today's Date: 02/19/2021 PT Individual Time: 6568-1275 and 1700-1749 PT Individual Time Calculation (min): 24 min and 59 min  Short Term Goals: Week 1:  PT Short Term Goal 1 (Week 1): Pt will perform supine<>sit with mod assist PT Short Term Goal 2 (Week 1): Pt will perform bed<>chair transfers with mod assist PT Short Term Goal 3 (Week 1): Pt will perform sit<>stands using LRAD with +2 mod assist PT Short Term Goal 4 (Week 1): Pt will initiate gait training  Skilled Therapeutic Interventions/Progress Updates:     Session 1  Pt received supine in bed upon arrival. Pt did not report any pain and agreeable to therapy. Therapist assisted pt with pericare and donning clean brief via cuing to roll both ways. Min assist to L sidelying via verbal cuing tobend R knee and push towards L side, and grab/pull L bed rail with RUE; max assist to R sidelying with LLE flexion into hooklying (actively able to assist with flexing L knee). Ted hose and pants donned max assist with verbal cuing to lift LLE (active hip flex observed). Pt LLE placed into hooklying and pt cued and able to bridge into sufficient hip extension to complete final step of pant donning. Pt cued to roll into L sidelying min assist with same cues used previously in session. Sidelying to sitting mod assist with verbal cuing to push with RUE. Pt continues to ER LLE, but able to actively assist in repositioning to neutral. Foot re-placed to increase knee flexion to promote WB and pt transferred to [facility] towards R squat pivot max assist with verbal cuing to lean towards R side into therapist and therapist facilitating LLE WB for NMR. Pt tilted for pressure relief and LUE supported by pillows for tone management and to facilitate ER. Pt left in [facility] with seat belt alarm turned on and needs within reach.  Session 2 Pt received supine in bed  with HOB elevated and 2 sisters in room. Pt did not report any pain and agreeable to therapy, and sisters left room at beginning of session. HOB lowered to flat, pt transferred to L sidelying min assist via verbal cuing tobend R knee and push towards L side, and grab/pull L bed rail with RUE. Sidelying to sitting mod assist with verbal cuing to push with RUE. Pt continues to ER LLE, but able to actively assist in repositioning to neutral. Foot re-placed to increase knee flexion to promote WB and pt transferred to [facility] towards R squat pivot max assist with verbal cuing to lean towards R side into therapist and therapist facilitating LLE WB for NMR. Pt tilted for pressure relief and wheeled to therapy gym. Pt transferred to mat L squat pivot max assist with same cues used previously in session. Transferred to standing mod assist with L knee blocking to counter hyperextension and tactile cuing to extend hips. Pt demonstrates strong L lean in standing requiring verbal cuing to re-orient to midline. Gait training 2x20' +2 max assist with +1 WC follow. Pt leans trunk heavily onto L side throughout ambulation, and requires verbal cuing to stay upright. Pt verbally cued to shift hips to L during R swing, and tactile cuing with physical assistance at gluteal region to extend L hip. Knee blocking also required to prevent hyperextension during L stance. During L swing, physical assistance near proximal tibia to correct hip from excessive ER LLE during L  heel strike. Pt wheeled back to room and transferred L to sitting EOB with same cues used previously in session. Transferred to supine max assist with observation of L hip flex to place LLE onto bed. LUE supported by pillows for tone management and to facilitate ER. Pt left with bed alarm turned on and needs within reach.  Therapy Documentation Precautions:  Precautions Precautions: Fall,Other (comment) Precaution Comments: L hemiplegia, left neglect, hx of seizures, age  indeterminate B LE DVTs Restrictions Weight Bearing Restrictions: No    Therapy/Group: Individual Therapy  Eleonore Chiquito, SPT 02/19/2021, 5:23 PM

## 2021-02-19 NOTE — Progress Notes (Signed)
Occupational Therapy Session Note  Patient Details  Name: Matthew Wise MRN: [id number] Date of Birth: 1961-03-26  Today's Date: 02/19/2021 OT Individual Time: 9390-3009 OT Individual Time Calculation (min): 56 min    Short Term Goals: Week 1:  OT Short Term Goal 1 (Week 1): Pt will donn a pullover shirt with min assist and mod demonstrational cueing for hemi dressing technique. OT Short Term Goal 2 (Week 1): Pt was able to complete LB bathing with max assist sit to stand and use of AE PRN. OT Short Term Goal 3 (Week 1): Pt will complete squat pivot toilet transfer to drop arm commode with mod assist. OT Short Term Goal 4 (Week 1): Pt will donn pants with max assist sit to stand with use of AE PRN.  Skilled Therapeutic Interventions/Progress Updates:    Pt up in wheelchair to start session agreeable to therapy.  He declined attempting shower, with plan to try and take one tomorrow time permitting.  Took him down to the therapy gym with use of the TIS wheelchair.  He was then able to transfer from the chair to the therapy mat stand pivot with overall max assist.  He then worked on LUE weightbearing and stretching of the left anterior chest and pectoral.  Positioned LUE in slight shoulder extension and external rotation while having him work on maintaining anterior pelvic tilt and perform reaching tasks laterally and behind him with the RUE to promote scapular adduction.  He was able to pick up pegs from behind him and place in the peg board with the RUE.  Mod instructional cueing to pick up only one at a time.  Mod demonstrational cueing and facilitation to maintain anterior pelvic tilt when coming back to midline.  He next voiced the need to toilet, so transfer back to the wheelchair squat pivot with mod assist was completed.  Once back in the room, he performed stand pivot transfer to the 3:1 over the toilet with max assist.  He was able to complete toilet hygiene sit to stand and clothing  management at max assist as well.  Performed stand pivot transfer back to the wheelchair with max assist and finished session with completion of hand washing at the sink with min facilitation.  He was left in the TIS wheelchair with call button in reach and phone in hand.  Safety belt in place as well.    Therapy Documentation Precautions:  Precautions Precautions: Fall,Other (comment) Precaution Comments: L hemiplegia, left neglect, hx of seizures, age indeterminate B LE DVTs Restrictions Weight Bearing Restrictions: No   Pain: Pain Assessment Pain Scale: Faces Pain Score: 0-No pain ADL: See Care Tool Section for some details of mobility and selfcare  Therapy/Group: Individual Therapy  Lin Hackmann,Sigmund OTR/L 02/19/2021, 12:04 PM

## 2021-02-19 NOTE — Progress Notes (Signed)
PROGRESS NOTE   Subjective/Complaints: Team conference today Patient notes nasal congestion- took flonase at home- ordered for here.  Discussed cost of his Pradaxa and provided with copay card. He is working with [name], [place]: +allergies   Objective:   No results found. Recent Labs    02/17/21 0646  WBC 5.0  HGB 13.6  HCT 39.5  PLT 297   Recent Labs    02/17/21 0646  NA 138  K 3.6  CL 102  CO2 28  GLUCOSE 100*  BUN 15  CREATININE 1.72*  CALCIUM 10.1    Intake/Output Summary (Last 24 hours) at 02/19/2021 1248 Last data filed at 02/18/2021 2100 Gross per 24 hour  Intake 360 ml  Output 100 ml  Net 260 ml        Physical Exam: Vital Signs Blood pressure 113/63, pulse 60, temperature 98.7 F (37.1 C), resp. rate 18, height 6' (1.829 m), weight 117.6 kg, SpO2 98 %. Gen: no distress, normal appearing HEENT: oral mucosa pink and moist, NCAT Cardio: Reg rate Chest: normal effort, normal rate of breathing Abd: soft, non-distended Ext: no edema Psych: pleasant, normal affect Skin: intact Neurologic: Cranial nerves II through XII intact, motor strength is 5/5 in Right deltoid, bicep, tricep, grip, hip flexor, knee extensors, ankle dorsiflexor and plantar flexor 0/5 LUE and LLE Tone - MAS 3 in L pec, L biceps Sensory exam reduced sensation to LT LUE and LLE --stable exam Musculoskeletal: Full range of motion in all 4 extremities. No joint swelling    Assessment/Plan: 1. Functional deficits which require 3+ hours per day of interdisciplinary therapy in a comprehensive inpatient rehab setting.  Physiatrist is providing close team supervision and 24 hour management of active medical problems listed below.  Physiatrist and rehab team continue to assess barriers to discharge/monitor patient progress toward functional and medical goals  Care Tool:  Bathing    Body parts bathed by patient: Left  arm,Chest,Abdomen,Right upper leg,Front perineal area,Left upper leg,Right lower leg,Face   Body parts bathed by helper: Right arm,Buttocks,Left lower leg     Bathing assist Assist Level: Maximal Assistance - Patient 24 - 49%     Upper Body Dressing/Undressing Upper body dressing   What is the patient wearing?: Pull over shirt    Upper body assist Assist Level: Minimal Assistance - Patient > 75%    Lower Body Dressing/Undressing Lower body dressing      What is the patient wearing?: Incontinence brief,Pants     Lower body assist Assist for lower body dressing: Maximal Assistance - Patient 25 - 49%     Toileting Toileting    Toileting assist Assist for toileting: Maximal Assistance - Patient 25 - 49%     Transfers Chair/bed transfer  Transfers assist     Chair/bed transfer assist level: Maximal Assistance - Patient 25 - 49% (squat pivot  to the mat and wheelchair)     Locomotion Ambulation   Ambulation assist   Ambulation activity did not occur: Safety/medical concerns  Assist level: 2 helpers (+27max A via 3 Musketeer) Assistive device: Other (comment) (3 Musketeer) Max distance: 41ft   Walk 10 feet activity   Assist  Walk 10 feet activity did not occur: Safety/medical concerns  Assist level: 2 helpers (+43max A via 3 Musketeer) Assistive device: Other (comment) (3 Musketeer)   Walk 50 feet activity   Assist Walk 50 feet with 2 turns activity did not occur: Safety/medical concerns         Walk 150 feet activity   Assist Walk 150 feet activity did not occur: Safety/medical concerns         Walk 10 feet on uneven surface  activity   Assist Walk 10 feet on uneven surfaces activity did not occur: Safety/medical concerns         Wheelchair     Assist Will patient use wheelchair at discharge?: Yes (Yes per PT long term goals) Type of Wheelchair: Manual Wheelchair activity did not occur: Safety/medical concerns          Wheelchair 50 feet with 2 turns activity    Assist            Wheelchair 150 feet activity     Assist          Blood pressure 113/63, pulse 60, temperature 98.7 F (37.1 C), resp. rate 18, height 6' (1.829 m), weight 117.6 kg, SpO2 98 %.   Medical Problem List and Plan: 1.  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  Antithrombotics: -DVT/anticoagulation: Bilateral lower extremity DVT/PE in the setting of hypercoagulopathy disorder.  Continue Pradaxa.CBC ok. Provided with copay card.              -antiplatelet therapy: Aspirin 81 mg daily 3. Pain Management: Continue Tylenol as needed 4. Mood: Provide emotional support             -antipsychotic agents: N/A 5. Neuropsych: This patient is capable of making decisions on his own behalf. 6. Skin/Wound Care: Routine skin checks 7. Fluids/Electrolytes/Nutrition: Routine in and outs             CMP ordered 8.  Seizure disorder.  Continue Keppra 500 mg twice daily 9.  Hypertension.  Lisinopril 40 mg daily, hydralazine 75 mg every 8 hours, Norvasc 10 mg daily.      Vitals:   02/19/21 0334 02/19/21 0735  BP: 122/69 113/63  Pulse: (!) 57 60  Resp: 18   Temp: 98.7 F (37.1 C)   SpO2: 98%   controlled 3/23- continue current regimen.  10.  Hypothyroidism.  Synthroid 11.  CKD stage III.  Baseline creatinine 1.83.  Improved 1.31 on 3/17             CMP ordered for tomorrow 12.  History of tobacco abuse.  Provide counseling 13.  Hyperlipidemia: Lipitor 14. Urinary frequency/incontinence:  3/20 -timed voids    -UA neg, C and S pnd  15. Seasonal allergies: start Flonase.   LOS: 7 days A FACE TO FACE EVALUATION WAS PERFORMED  Matthew Wise 02/19/2021, 12:48 PM

## 2021-02-20 MED ORDER — BACLOFEN 10 MG PO TABS
10.0000 mg | ORAL_TABLET | Freq: Three times a day (TID) | ORAL | Status: DC
  Administered 2021-02-20 – 2021-02-28 (×24): 10 mg via ORAL
  Filled 2021-02-20 (×24): qty 1

## 2021-02-20 NOTE — Progress Notes (Signed)
PROGRESS NOTE   Subjective/Complaints: No complaints this morning He is fatigued and has early session- Carly says she will try to schedule later sessions for future Tone continues to be significant- increased Baclofen to 10mg  as does not appear to be overly sedating  ROS: Nasal congestion improved with Flonase.    Objective:   No results found. No results for input(s): WBC, HGB, HCT, PLT in the last 72 hours. No results for input(s): NA, K, CL, CO2, GLUCOSE, BUN, CREATININE, CALCIUM in the last 72 hours.  Intake/Output Summary (Last 24 hours) at 02/20/2021 1241 Last data filed at 02/20/2021 1100 Gross per 24 hour  Intake 240 ml  Output 601 ml  Net -361 ml        Physical Exam: Vital Signs Blood pressure 128/69, pulse (!) 55, temperature 98 F (36.7 C), resp. rate 17, height 6' (1.829 m), weight 117.6 kg, SpO2 100 %. Gen: no distress, normal appearing HEENT: oral mucosa pink and moist, NCAT Cardio: Bradycardia Chest: normal effort, normal rate of breathing Abd: soft, non-distended Ext: no edema Psych: pleasant, normal affect Skin: intact Neurologic: Cranial nerves II through XII intact, motor strength is 5/5 in Right deltoid, bicep, tricep, grip, hip flexor, knee extensors, ankle dorsiflexor and plantar flexor 0/5 LUE and LLE Tone - MAS 3 in L pec, L biceps, 1 in L wrist Sensory exam reduced sensation to LT LUE and LLE --stable exam Musculoskeletal: Full range of motion in all 4 extremities. No joint swelling    Assessment/Plan: 1. Functional deficits which require 3+ hours per day of interdisciplinary therapy in a comprehensive inpatient rehab setting.  Physiatrist is providing close team supervision and 24 hour management of active medical problems listed below.  Physiatrist and rehab team continue to assess barriers to discharge/monitor patient progress toward functional and medical goals  Care  Tool:  Bathing    Body parts bathed by patient: Left arm,Chest,Abdomen,Right upper leg,Front perineal area,Left upper leg,Right lower leg,Face   Body parts bathed by helper: Right arm,Buttocks,Left lower leg     Bathing assist Assist Level: Maximal Assistance - Patient 24 - 49%     Upper Body Dressing/Undressing Upper body dressing   What is the patient wearing?: Pull over shirt    Upper body assist Assist Level: Minimal Assistance - Patient > 75%    Lower Body Dressing/Undressing Lower body dressing      What is the patient wearing?: Incontinence brief,Pants     Lower body assist Assist for lower body dressing: Maximal Assistance - Patient 25 - 49%     Toileting Toileting    Toileting assist Assist for toileting: Maximal Assistance - Patient 25 - 49%     Transfers Chair/bed transfer  Transfers assist     Chair/bed transfer assist level: Maximal Assistance - Patient 25 - 49%     Locomotion Ambulation   Ambulation assist   Ambulation activity did not occur: Safety/medical concerns  Assist level: 2 helpers Assistive device: Other (comment) (3 Musketeers) Max distance: 39ft   Walk 10 feet activity   Assist  Walk 10 feet activity did not occur: Safety/medical concerns  Assist level: 2 helpers (+70max A via 3 Musketeer) Assistive  device: Other (comment) (3 Musketeer)   Walk 50 feet activity   Assist Walk 50 feet with 2 turns activity did not occur: Safety/medical concerns         Walk 150 feet activity   Assist Walk 150 feet activity did not occur: Safety/medical concerns         Walk 10 feet on uneven surface  activity   Assist Walk 10 feet on uneven surfaces activity did not occur: Safety/medical concerns         Wheelchair     Assist Will patient use wheelchair at discharge?: Yes (Yes per PT long term goals) Type of Wheelchair: Manual Wheelchair activity did not occur: Safety/medical concerns         Wheelchair 50  feet with 2 turns activity    Assist            Wheelchair 150 feet activity     Assist          Blood pressure 128/69, pulse (!) 55, temperature 98 F (36.7 C), resp. rate 17, height 6' (1.829 m), weight 117.6 kg, SpO2 100 %.   Medical Problem List and Plan: 1.  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  Antithrombotics: -DVT/anticoagulation: Bilateral lower extremity DVT/PE in the setting of hypercoagulopathy disorder.  Continue Pradaxa.CBC ok. Provided with copay card.              -antiplatelet therapy: Aspirin 81 mg daily 3. Pain Management: Continue Tylenol as needed 4. Mood: Provide emotional support             -antipsychotic agents: N/A 5. Neuropsych: This patient is capable of making decisions on his own behalf. 6. Skin/Wound Care: Routine skin checks 7. Fluids/Electrolytes/Nutrition: Routine in and outs             CMP ordered 8.  Seizure disorder.  Continue Keppra 500 mg twice daily 9.  Hypertension.  Lisinopril 40 mg daily, hydralazine 75 mg every 8 hours, Norvasc 10 mg daily.      Vitals:   02/19/21 1940 02/20/21 0449  BP: 117/66 128/69  Pulse: 63 (!) 55  Resp: 18 17  Temp: 98.5 F (36.9 C) 98 F (36.7 C)  SpO2: 100% 100%  controlled 3/24- continue current regimen.  10.  Hypothyroidism.  Synthroid 11.  CKD stage III.  Baseline creatinine 1.83.  Improved 1.31 on 3/17, up to 1.72 on 3/21             CMP ordered for tomorrow 12.  History of tobacco abuse.  Provide counseling 13.  Hyperlipidemia: Lipitor 14. Urinary frequency/incontinence:  3/20 -timed voids    -UA neg, UC shows insignificant growth.   15. Seasonal allergies: continue Flonase.  16. Post stroke spasticity:  increase Baclofen to 10mg  TID  LOS: 8 days A FACE TO FACE EVALUATION WAS PERFORMED  Matthew Wise 02/20/2021, 12:41 PM

## 2021-02-20 NOTE — Progress Notes (Signed)
Patient having sensation needs to void, then attempts voiding in urinal with no results. Other times, brief wet or spills urinal while attempting to use independently. PVR bladder scan showed 237 mL urine residual after last void of shift. Will report to night shift RN and continue to monitor.

## 2021-02-20 NOTE — Progress Notes (Signed)
Physical Therapy Weekly Progress Note  Patient Details  Name: Matthew Wise MRN: [id number] Date of Birth: Jan 30, 1961  Beginning of progress report period: February 13, 2021 End of progress report period: February 20, 2021  Today's Date: 02/20/2021 PT Individual Time: 0810-0908 PT Individual Time Calculation (min): 58 min   Patient has met 2 of 4 short term goals.  Despite having only met half of short-term goals, pt is progressing well with therapy in line with POC. He currently can complete rolling in bed with min assist, but still requires max assist to come into sitting and transition back to supine. Pt continues to require max assist with sit to stand transfers and squat pivot chair/bed transfers. L inattention also continues to be observed. Pt demonstrates increased tone of the hip external rotators in sitting and supine. Pt has progressed to gait training with +2 max assist 3 musketeer with knee hyperextension, decreased L hip weight shifting, and increased L hip external rotation.  Patient continues to demonstrate the following deficits muscle weakness, muscle joint tightness and muscle paralysis, decreased cardiorespiratoy endurance, impaired timing and sequencing, abnormal tone, unbalanced muscle activation, decreased coordination and decreased motor planning, decreased visual perceptual skills, decreased midline orientation, decreased attention to left and decreased motor planning, decreased initiation, decreased attention, decreased awareness, decreased problem solving, decreased safety awareness and delayed processing and decreased sitting balance, decreased standing balance, decreased postural control, hemiplegia and decreased balance strategies and therefore will continue to benefit from skilled PT intervention to increase functional independence with mobility.  Patient progressing toward long term goals..  Continue plan of care.  PT Short Term Goals Week 1:  PT Short Term Goal 1 (Week  1): Pt will perform supine<>sit with mod assist PT Short Term Goal 1 - Progress (Week 1): Progressing toward goal PT Short Term Goal 2 (Week 1): Pt will perform bed<>chair transfers with mod assist PT Short Term Goal 2 - Progress (Week 1): Progressing toward goal PT Short Term Goal 3 (Week 1): Pt will perform sit<>stands using LRAD with +2 mod assist PT Short Term Goal 3 - Progress (Week 1): Met PT Short Term Goal 4 (Week 1): Pt will initiate gait training PT Short Term Goal 4 - Progress (Week 1): Met Week 2:  PT Short Term Goal 1 (Week 2): Pt will perform sit to stand with mod assist. PT Short Term Goal 2 (Week 2): Pt will perform bed to chair transfers with mod assist. PT Short Term Goal 3 (Week 2): Pt will perform gait with LRAD for 40 ft +2 mod assist. PT Short Term Goal 4 (Week 2): Pt will perform supine to sit and sit to supine with mod assist.   Skilled Therapeutic Interventions/Progress Updates:  Ambulation/gait training;Community reintegration;DME/adaptive equipment instruction;Neuromuscular re-education;Psychosocial support;Stair training;UE/LE Strength taining/ROM;Wheelchair propulsion/positioning;Balance/vestibular training;Discharge planning;Functional electrical stimulation;Pain management;Therapeutic Activities;Skin care/wound management;UE/LE Coordination activities;Cognitive remediation/compensation;Disease management/prevention;Functional mobility training;Splinting/orthotics;Patient/family education;Therapeutic Exercise;Visual/perceptual remediation/compensation    Pt received supine in bed; agreeable to therapy and no report of pain. Pt verbalized needing to urinate and given urinal. Pt verbalized feeling urge but not able to urinate for a few min, until bed was tilted upward. Pt continent of urine ~281m. Therapist assisted pt with pericare anddonning clean briefvia cuing to roll both ways. Min assist to L sidelying via verbal cuing tobend R knee and push towards L side, and  grab/pull L bed rail with RUE; max assist to R sidelyingwith therapist facilitating LLE flexion into hooklying (actively able to assist with flexing L knee). Ted hose  and pants donned max assist with verbal cuing to lift LLE (active hip flex observed). Pt LLE placed into hooklying and pt cued and able to bridge into sufficient hip extension with max assist due to L hip weakness to completefinal step ofpant donning total assist. MD entered room to discuss pt needs: particularly discussed consistent nasal congestion and LUE tone management. MD left room and RN entered for medication administration. Pt cued to roll into L sidelying min assist with same cues used previously in session. Sidelying to sitting mod assist with verbal cuing to push with RUE. Pt continues to ER LLE, but able to actively assist in repositioning to neutral; still has notable hip ER tightness with significant resistance into neutral alignment. RN administered meds with pt sitting EOB. L foot re-placed to increase knee flexion to promote WB and pt transferred to [facility] towards Rsquat pivotmax assist with verbal cuing to lean towards Rside into therapist and therapist facilitating LLE WB for NMR. Pt tilted and wheeled to sink to brush teeth. Pt demonstrated difficulty planning sequential steps of how to most efficiently hold toothbrush while holding toothpaste (and removing/rescrewing toothpaste cap). L inattention affecting sequencing also observed multiple times during this process. Pt wheeled to therapy gym. LLE NMR via STS mod/max assist with verbal cuing and tactile cuing to posterior pelvic tilt and extend hips. L knee blocking required to counter hyperextension. Pt verbal and tactile cuing to shift hips towards L side, but pt consistently attempted to side bend towards L instead. Activity performed 4x with at least 20 sec of stance time and no sitting rest break. Pt wheeled back to room and tilted for pressure relief and LUE supported by  pillows for tone management and to facilitate ER. Pt left in [facility] with seat belt alarm turned on and needs within reach.  Therapy Documentation Precautions:  Precautions Precautions: Fall,Other (comment) Precaution Comments: L hemiplegia, left neglect, hx of seizures, age indeterminate B LE DVTs Restrictions Weight Bearing Restrictions: No   Therapy/Group: Individual Therapy  Eleonore Chiquito, SPT 02/20/2021, 12:08 PM

## 2021-02-20 NOTE — Progress Notes (Signed)
Occupational Therapy Weekly Progress Note  Patient Details  Name: Matthew Wise MRN: [id number] Date of Birth: August 03, 1961  Beginning of progress report period: February 13, 2021 End of progress report period: February 20, 2021  Today's Date: 02/20/2021 OT Individual Time: 4481-8563 OT Individual Time Calculation (min): 62 min    Patient has met 2 of 4 short term goals.  Pt is making steady progress with OT at this time.  Currently, he can complete UB bathing with min assist in supported sitting.  He needs mod assist for UB dressing as well.  LB bathing is currently max assist sit to stand with max assist for LB dressing following hemi techniques.  He continues to need max assist for squat and stand pivot transfers to the wheelchair and the 3:1.  Max assist is also needed for toilet hygiene and clothing management.  He continues to demonstrate left inattention with left visual field deficit.  LUE hemiparesis is still present at [facility] stage II in the arm and stage I in the hand.  Increased tone is noted in the internal rotators of the shoulder and elbow flexors.  Feel he will benefit from continued CIR level therapy to progress to min assist level goals.  Current discharge plan is for 4/13 with family providing 24 hour assist as needed.    Patient continues to demonstrate the following deficits: muscle weakness and muscle paralysis, impaired timing and sequencing, unbalanced muscle activation and decreased coordination, field cut, decreased attention to left and left side neglect, decreased attention, decreased awareness, decreased problem solving, decreased safety awareness, decreased memory and delayed processing and decreased sitting balance, decreased standing balance, decreased postural control, hemiplegia and decreased balance strategies and therefore will continue to benefit from skilled OT intervention to enhance overall performance with BADL and Reduce care partner burden.  Patient  progressing toward long term goals..  Continue plan of care.  OT Short Term Goals Week 2:  OT Short Term Goal 1 (Week 2): Pt will donn a pullover shirt with min assist and mod demonstrational cueing for hemi dressing technique. OT Short Term Goal 2 (Week 2): Pt will complete squat pivot toilet transfer to drop arm commode with mod assist. OT Short Term Goal 3 (Week 2): Pt will donn pants with max assist sit to stand with use of AE PRN.  Skilled Therapeutic Interventions/Progress Updates:    Pt worked on bathing and dressing during session.  He was able to transfer to the EOB with max assist.  Charlaine Dalton was used for transfer to the walk-in shower secondary to time.  He was able to complete sit to stand in the [place] throughout session with mod assist.  He needed max assist for removal of brief and gripper socks prior to shower.  Mickey Farber was removed with min assist.  He completed UB bathing with overall min assist and mod instructional cueing to sequence.  Hand over hand max assist was needed to integrate LUE use throughout session.  Pt with some decreased awareness noted as when therapist assisted him with holding the soap in his left hand to remove the top, he would stop removing it and attempt to place washcloth closer to it to apply soap, without the cap being removed.  He was re-directed to situation.  LB bathing was at max assist level with pt demonstrating some incontinence of bowel while wiping, with report of needing to transfer to the 3:1.  [place] was used for toilet transfer secondary to urgency with pt again needing max  assist in the St[place] to complete toilet hygiene.  He transferred to the wheelchair from the toilet where he completed donning brief and pants with max assist and mod instructional cueing for technique.  Mod assist was needed during sit to stand when pulling items over his hips.  Mod assist for donning pullover scrub top with total assist for gripper socks to finish session.  Call button and  phone in reach with safety belt in place and LUE supported on pillows.    Therapy Documentation Precautions:  Precautions Precautions: Fall,Other (comment) Precaution Comments: L hemiplegia, left neglect, hx of seizures, age indeterminate B LE DVTs Restrictions Weight Bearing Restrictions: No  Pain: Pain Assessment Pain Scale: Faces Pain Score: 0-No pain ADL: See Care Tool Section for some details of mobility and selfcare  Therapy/Group: Individual Therapy  MCGUIRE,Knowledge OTR/L 02/20/2021, 4:21 PM

## 2021-02-20 NOTE — Progress Notes (Signed)
Speech Language Pathology Weekly Progress and Session Note  Patient Details  Name: Matthew Wise MRN: [id number] Date of Birth: 10-12-1961  Beginning of progress report period: February 13, 2021 End of progress report period: February 20, 2021  Today's Date: 02/20/2021 SLP Individual Time: 1100-1145 SLP Individual Time Calculation (min): 45 min  Short Term Goals: Week 1: SLP Short Term Goal 1 (Week 1): Pt will demonsrate ability to use compensatory strategies to clear left buccal poceting and minimize left anterior loss of POs with Supervision A verbal cues. SLP Short Term Goal 1 - Progress (Week 1): Progressing toward goal SLP Short Term Goal 2 (Week 1): Pt will demonstrate efficient mastication and oral clearance of regular textures a minimum of 2 sessions and no more than Min A cues required for oral clearance prior to advancement. SLP Short Term Goal 2 - Progress (Week 1): Progressing toward goal SLP Short Term Goal 3 (Week 1): Pt will demonstrate ability to problem solve basic to mildly complex tasks with Mod A verbal/visual cuess. SLP Short Term Goal 3 - Progress (Week 1): Met SLP Short Term Goal 4 (Week 1): Pt will recall new and/or daily information with Mod A cues. SLP Short Term Goal 4 - Progress (Week 1): Met SLP Short Term Goal 5 (Week 1): Pt will attend to left side of his body and/or locate items to left of midline with Moderate cueing. SLP Short Term Goal 5 - Progress (Week 1): Met SLP Short Term Goal 6 (Week 1): Pt will detect functional errors with Min A cues. SLP Short Term Goal 6 - Progress (Week 1): Progressing toward goal    New Short Term Goals: Week 2: SLP Short Term Goal 1 (Week 2): Pt will demonstrate efficient mastication and oral clearance of regular textures a minimum of 2 sessions and no more than Min A cues required for oral clearance prior to advancement. SLP Short Term Goal 2 (Week 2): Pt will demonstrate ability to problem solve basic to mildly complex tasks  with Min A verbal/visual cuess. SLP Short Term Goal 3 (Week 2): Pt will recall new and/or daily information with Min A cues. SLP Short Term Goal 4 (Week 2): Pt will attend to left side of his body and/or locate items to left of midline with Supervision A cueing. SLP Short Term Goal 5 (Week 2): Pt will detect functional errors with Min A cues. SLP Short Term Goal 6 (Week 2): Pt will alternate his between between functional tasks with Min A cues for redirection.  Weekly Progress Updates: Pt has made functional gains and met 3 out of 6 short term goals this reporting period. Pt is currently Min-Mod assist for basic to mildly complex task sdue to cognitive impairments impacting his emergent awareness, problem solving, cognitive processing speed, left visual inattention, as well as more mild short term memory and higher level attention deficits. Pt is consuming a dysphagia 3 (mechanical soft) diet with thin liquids without overt s/sx aspiration, but still required ~Min A cueing for awareness and w clearance of left buccal pocketing with use of compensatory swallowing strategies. He is currently working toward upgrading to regular textures with SLP. Pt has demonstrated improved basic problem solving, attention, and daily recall. Pt education is ongoing; no family has been present for ST sessions yet. Pt would continue to benefit from skilled ST while inpatient in order to maximize functional independence and reduce burden of care prior to discharge. Anticipate that pt will need 24/7 supervision at discharge in addition  to ST follow up at next level of care.       Intensity: Minumum of 1-2 x/day, 30 to 90 minutes Frequency: 3 to 5 out of 7 days Duration/Length of Stay: 03/12/21 Treatment/Interventions: Cognitive remediation/compensation;Cueing hierarchy;Dysphagia/aspiration precaution training;Functional tasks;Patient/family education;Internal/external aids   Daily Session  Skilled Therapeutic  Interventions: Pt was seen for skilled ST targeting cognitive goals. Upon arrival, pt was on bedpan and reported he was finished with a bowel movement. He was continent of bowel, although stool mostly stuck in between his legs rather than in bedpan. Hygiene and brief change provided. Pt then requested to use urinal. Feedback provided regarding using urinal before donning new brief and pants next time - sequencing error. Pt attempted to use urinal, with Min A  Verbal and visual cues for problem solving, however unable to void. SLP further facilitated session with a basic grocery ad search task. Pt alternated his attention between list of items and the ad with Min A cues for redirection, and he located items on left side of the ad with overall Min A verbal/viusual cues for problem solving strategies and visual scanning. He calculated prices of items and totals with Supervision A verbal cues for problem solving. Pt became increasingly fatigued during session, frequently closing his eyes, and requested to rest. Therefore, session ended 15 mins early due to pt fatigue. Pt left laying in bed with alarm set and needs within reach. Continue per current plan of care.             Pain Pain Assessment Pain Scale: 0-10 Pain Score: 0-No pain  Therapy/Group: Individual Therapy  Arbutus Leas 02/20/2021, 7:30 AM

## 2021-02-20 NOTE — Progress Notes (Signed)
Patient ID: Matthew Wise, male   DOB: February 27, 1961, 60 y.o.   MRN: [id number]   SW spoke with pt sister Deneise Lever), provided team conference updates from 3/23. Informed sister of patient progress and updates from team. Pt sister updated on d/c date. Sister does feel like she and family can handle pt's LOC at d/c. SW will cont to follow up with questions and concerns.   Lyn[place], [place]

## 2021-02-21 LAB — URINALYSIS, ROUTINE W REFLEX MICROSCOPIC
Bilirubin Urine: NEGATIVE
Glucose, UA: NEGATIVE mg/dL
Hgb urine dipstick: NEGATIVE
Ketones, ur: NEGATIVE mg/dL
Leukocytes,Ua: NEGATIVE
Nitrite: NEGATIVE
Protein, ur: NEGATIVE mg/dL
Specific Gravity, Urine: 1.012 (ref 1.005–1.030)
pH: 5 (ref 5.0–8.0)

## 2021-02-21 MED ORDER — ATROPINE SULFATE 1 MG/10ML IJ SOSY
PREFILLED_SYRINGE | INTRAMUSCULAR | Status: AC
  Filled 2021-02-21: qty 10

## 2021-02-21 MED ORDER — TAMSULOSIN HCL 0.4 MG PO CAPS
0.4000 mg | ORAL_CAPSULE | Freq: Every day | ORAL | Status: DC
  Administered 2021-02-21 – 2021-03-11 (×19): 0.4 mg via ORAL
  Filled 2021-02-21 (×19): qty 1

## 2021-02-21 NOTE — Progress Notes (Signed)
Speech Language Pathology Daily Session Note  Patient Details  Name: Matthew Wise MRN: [id number] Date of Birth: 1961/05/07  Today's Date: 02/21/2021 SLP Individual Time: 0355-9741 SLP Individual Time Calculation (min): 55 min  Short Term Goals: Week 2: SLP Short Term Goal 1 (Week 2): Pt will demonstrate efficient mastication and oral clearance of regular textures a minimum of 2 sessions and no more than Min A cues required for oral clearance prior to advancement. SLP Short Term Goal 2 (Week 2): Pt will demonstrate ability to problem solve basic to mildly complex tasks with Min A verbal/visual cuess. SLP Short Term Goal 3 (Week 2): Pt will recall new and/or daily information with Min A cues. SLP Short Term Goal 4 (Week 2): Pt will attend to left side of his body and/or locate items to left of midline with Supervision A cueing. SLP Short Term Goal 5 (Week 2): Pt will detect functional errors with Min A cues. SLP Short Term Goal 6 (Week 2): Pt will alternate his between between functional tasks with Min A cues for redirection.  Skilled Therapeutic Interventions: Pt was seen for skilled ST targeting dysphagia and cognition. SLP facilitated session with an upgraded trial of regular textured muffin with thin liquids. Pt demonstrated min left anterior loss and left buccal pocketing of solids, however awareness and effectiveness of compensatory strategies to clear pocketing is noticeably improving - only Supervision A verbal cues provided. Furthermore, the quantity of pocketing does not appear increased/very different from the degree of pocketing with current diet Dysphagia 3 textures. Would recommend a trial tray or larger quantity regular texture trial at next available session to determine readiness for advancement. Recommend continue current diet for now. SLP further facilitated session with a semi-complex money scenario task during which pt was Mod I for functional verbal problem solving and 100%  accurate, including ability to self correct X2. Pt is demonstrating good progress, but it should also be noted that this was strictly a verbal task, which pt is better able to problem solve than he is functionally a this time. Pt also completed a basic calendar based emergent awareness task, during which he required Moderate prompting to identify logical or detail/formatting errors on a November monthly calendar. Pt left sitting in tilt in space chair with alarm set and needs within reach. Continue per current plan of care.        Pain Pain Assessment Pain Scale: Faces Faces Pain Scale: No hurt  Therapy/Group: Individual Therapy  Arbutus Leas 02/21/2021, 7:24 AM

## 2021-02-21 NOTE — Progress Notes (Signed)
Occupational Therapy Session Note  Patient Details  Name: Matthew Wise MRN: [id number] Date of Birth: 02-17-1961  Today's Date: 02/21/2021 OT Individual Time: 3016-0109 OT Individual Time Calculation (min): 54 min    Short Term Goals: Week 2:  OT Short Term Goal 1 (Week 2): Pt will donn a pullover shirt with min assist and mod demonstrational cueing for hemi dressing technique. OT Short Term Goal 2 (Week 2): Pt will complete squat pivot toilet transfer to drop arm commode with mod assist. OT Short Term Goal 3 (Week 2): Pt will donn pants with max assist sit to stand with use of AE PRN.  Skilled Therapeutic Interventions/Progress Updates:    Pt up in wheelchair to start session with focus on dressing and grooming tasks.  While positioned at the sink in preparation for dressing, he voiced the need to try and use the bathroom.  He was rolled into the bathroom and completed stand pivot transfer to the right to the 3:1 with mod assist using the rail for support.  He also completed managing his brief at the same level.  Once finished, he was unsuccessful with BM but needed cleaning from some incontinence.  He required total assist for toilet hygiene in standing with total assist for donning and adjusting new brief.  Mod assist for stand pivot transfer to the wheelchair on the left side.  He was able to complete UB dressing at the sink with mod assist.  LB dressing for donning pants, TEDs, and gripper socks was max assist.  He continues to demonstrate significant dressing apraxia with decreased ability to orient clothing and follow hemi techniques.  Finished session with max assist stand pivot transfer to the bed.  Mod assist for transition to supine.  Gently PROM completed to the left shoulder and elbow secondary to increased tone.  Positioned LUE on pillows away from his lap to try and decrease this tone.  Call button and phone in reach with safety alarm in place.   Therapy  Documentation Precautions:  Precautions Precautions: Fall,Other (comment) Precaution Comments: L hemiplegia, left neglect, hx of seizures, age indeterminate B LE DVTs Restrictions Weight Bearing Restrictions: No   Pain: Pain Assessment Pain Scale: Faces Pain Score: 0-No pain Faces Pain Scale: No hurt ADL: See Care Tool Section for some details of mobility and selfcare  Therapy/Group: Individual Therapy  Taylon Coole,[place] OTR/L 02/21/2021, 12:45 PM

## 2021-02-21 NOTE — Progress Notes (Signed)
Physical Therapy Session Note  Patient Details  Name: GURMAN ASHLAND MRN: [id number] Date of Birth: 1961-07-14  Today's Date: 02/21/2021 PT Individual Time: 1400-1436 PT Individual Time Calculation (min): 36 min  and Today's Date: 02/21/2021 PT Missed Time: 24 Minutes Missed Time Reason: Patient fatigue  Short Term Goals: Week 2:  PT Short Term Goal 1 (Week 2): Pt will perform sit to stand with mod assist. PT Short Term Goal 2 (Week 2): Pt will perform bed to chair transfers with mod assist. PT Short Term Goal 3 (Week 2): Pt will perform gait with LRAD for 40 ft +2 mod assist. PT Short Term Goal 4 (Week 2): Pt will perform supine to sit and sit to supine with mod assist.  Skilled Therapeutic Interventions/Progress Updates:    Patient received asleep in bed, difficult to rouse with multimodal cues. NT making PT aware that they're trying to get clean catch of urine. PT able to wake patient in very small intervals to assess whether he feels as though he could use the urinal. Patient shook his head "yes" without opening his eyes. PT placing urinal TotalA. After some time, patient was not successful, but when PT stepped away, patient stated "peeing." PT able to capture clean catch of urine with some spillage onto brief. RN made aware of urine sample. Patient then making PT aware that he felt as though he was going to have a bowel movement (again without opening his eyes). PT placed bed pan, but patient kept rolling off of it. Patient unable to follow cues to remain in proper place for successful use of bedpan. After some time, patient unable to have a bowel movement. TotalA for perihygiene and donning clean brief at bedlevel. RN aware of patients level of lethargy. Patient unsafe to participate in therapy due to low level of arousal at this time. Bed alarm on, call light within reach.   Therapy Documentation Precautions:  Precautions Precautions: Fall,Other (comment) Precaution Comments: L  hemiplegia, left neglect, hx of seizures, age indeterminate B LE DVTs Restrictions Weight Bearing Restrictions: No    Therapy/Group: Individual Therapy  Karoline Caldwell, PT, DPT, CBIS  02/21/2021, 7:48 AM

## 2021-02-21 NOTE — Progress Notes (Signed)
Patient noted alert & responsive during the shift. No c/o pain or discomfort. Medications given as ordered. Was informed by previous nurse that the patient is retaining some urine after he voids. He did void last night but when scanned, he had over 370ml of urine still in his bladder. He was asked to void again to prevent having to catheterize him & he did void again resulting in <200 ml in his bladder. He was incontinent at times. No acute distress noted.

## 2021-02-21 NOTE — Progress Notes (Signed)
PROGRESS NOTE   Subjective/Complaints: +urinary urgency. Discussed obtaining UA/UC and starting Flomax at night at patient is agreeable. He feels staff are not able to get to him soon enough after he feels urge to void. Appears more sleepy with baclofen but easily arousable  ROS: Nasal congestion improved with Flonase. +urinary urgency   Objective:   No results found. No results for input(s): WBC, HGB, HCT, PLT in the last 72 hours. No results for input(s): NA, K, CL, CO2, GLUCOSE, BUN, CREATININE, CALCIUM in the last 72 hours.  Intake/Output Summary (Last 24 hours) at 02/21/2021 1222 Last data filed at 02/21/2021 0715 Gross per 24 hour  Intake 360 ml  Output 1005 ml  Net -645 ml        Physical Exam: Vital Signs Blood pressure 134/87, pulse 66, temperature 98.7 F (37.1 C), resp. rate 16, height 6' (1.829 m), weight 117.6 kg, SpO2 100 %. Gen: no distress, normal appearing HEENT: oral mucosa pink and moist, NCAT Cardio: Reg rate Chest: normal effort, normal rate of breathing Abd: soft, non-distended Ext: no edema Psych: pleasant, normal affect Skin: intact Neurologic: Cranial nerves II through XII intact, motor strength is 5/5 in Right deltoid, bicep, tricep, grip, hip flexor, knee extensors, ankle dorsiflexor and plantar flexor 0/5 LUE and LLE Tone - MAS 3 in L pec, L biceps, 1 in L wrist Sensory exam reduced sensation to LT LUE and LLE --stable exam Musculoskeletal: Full range of motion in all 4 extremities. No joint swelling    Assessment/Plan: 1. Functional deficits which require 3+ hours per day of interdisciplinary therapy in a comprehensive inpatient rehab setting.  Physiatrist is providing close team supervision and 24 hour management of active medical problems listed below.  Physiatrist and rehab team continue to assess barriers to discharge/monitor patient progress toward functional and medical  goals  Care Tool:  Bathing    Body parts bathed by patient: Left arm,Chest,Abdomen,Right upper leg,Front perineal area,Left upper leg,Right lower leg,Face   Body parts bathed by helper: Buttocks,Left lower leg,Right arm     Bathing assist Assist Level: Moderate Assistance - Patient 50 - 74%     Upper Body Dressing/Undressing Upper body dressing   What is the patient wearing?: Pull over shirt    Upper body assist Assist Level: Moderate Assistance - Patient 50 - 74%    Lower Body Dressing/Undressing Lower body dressing      What is the patient wearing?: Incontinence brief,Pants     Lower body assist Assist for lower body dressing: Maximal Assistance - Patient 25 - 49%     Toileting Toileting    Toileting assist Assist for toileting: Moderate Assistance - Patient 50 - 74%     Transfers Chair/bed transfer  Transfers assist     Chair/bed transfer assist level: Maximal Assistance - Patient 25 - 49% (squat pivot)     Locomotion Ambulation   Ambulation assist   Ambulation activity did not occur: Safety/medical concerns  Assist level: Independent with assistive device Assistive device: Other (comment) (3 Musketeers) Max distance: 37ft   Walk 10 feet activity   Assist  Walk 10 feet activity did not occur: Safety/medical concerns  Assist level: 2  helpers (+61max A via 3 Musketeer) Assistive device: Other (comment) (3 Musketeer)   Walk 50 feet activity   Assist Walk 50 feet with 2 turns activity did not occur: Safety/medical concerns         Walk 150 feet activity   Assist Walk 150 feet activity did not occur: Safety/medical concerns         Walk 10 feet on uneven surface  activity   Assist Walk 10 feet on uneven surfaces activity did not occur: Safety/medical concerns         Wheelchair     Assist Will patient use wheelchair at discharge?: Yes (Yes per PT long term goals) Type of Wheelchair: Manual Wheelchair activity did not  occur: Safety/medical concerns         Wheelchair 50 feet with 2 turns activity    Assist            Wheelchair 150 feet activity     Assist          Blood pressure 134/87, pulse 66, temperature 98.7 F (37.1 C), resp. rate 16, height 6' (1.829 m), weight 117.6 kg, SpO2 100 %.   Medical Problem List and Plan: 1.  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  Continue Pradaxa. CBC ok. Provided with copay card.              -antiplatelet therapy: Aspirin 81 mg daily 3. Pain Management: Continue Tylenol as needed 4. Mood: Provide emotional support             -antipsychotic agents: N/A 5. Neuropsych: This patient is capable of making decisions on his own behalf. 6. Skin/Wound Care: Routine skin checks 7. Fluids/Electrolytes/Nutrition: Routine in and outs             CMP ordered 8.  Seizure disorder.  Continue Keppra 500 mg twice daily 9.  Hypertension.  Lisinopril 40 mg daily, hydralazine 75 mg every 8 hours, Norvasc 10 mg daily.      Vitals:   02/21/21 0455 02/21/21 0456  BP: 134/87 134/87  Pulse: 66 66  Resp: 16 16  Temp: 98.7 F (37.1 C) 98.7 F (37.1 C)  SpO2: 100% 100%  controlled 3/24- continue current regimen.  10.  Hypothyroidism.  Synthroid 11.  CKD stage III.  Baseline creatinine 1.83.  Improved 1.31 on 3/17, up to 1.72 on 3/21             CMP ordered for tomorrow 12.  History of tobacco abuse.  Provide counseling 13.  Hyperlipidemia: Lipitor 14. Urinary frequency/incontinence:  3/25: repeat UA/UC, start flomax HS. BP should tolerate  15. Seasonal allergies: continue Flonase.  16. Post stroke  spasticity: increase Baclofen to 10mg  TID- continue as tolerating dose. Outpatient Botox.   LOS: 9 days A FACE TO FACE EVALUATION WAS PERFORMED  Matthew Wise 02/21/2021, 12:22 PM

## 2021-02-22 LAB — URINE CULTURE: Culture: NO GROWTH

## 2021-02-22 NOTE — Progress Notes (Signed)
Physical Therapy Session Note  Patient Details  Name: Matthew Wise MRN: [id number] Date of Birth: [birth year]  Today's Date: 02/22/2021 PT Individual Time: 9390-3009 PT Individual Time Calculation (min): 40 min   Short Term Goals: Week 2:  PT Short Term Goal 1 (Week 2): Pt will perform sit to stand with mod assist. PT Short Term Goal 2 (Week 2): Pt will perform bed to chair transfers with mod assist. PT Short Term Goal 3 (Week 2): Pt will perform gait with LRAD for 40 ft +2 mod assist. PT Short Term Goal 4 (Week 2): Pt will perform supine to sit and sit to supine with mod assist.  Skilled Therapeutic Interventions/Progress Updates:    Patient received supine in bed, agreeable to PT. He denies pain, but reports feeling tired. Partway through setting up room for therapy, patient reporting need to use urinal. In the process, patient spilled urinal contents requiring brief change at bedlevel, MaxA. He was able to come sit edge of bed with MaxA x1. He required CGA/MinA to maintain static sitting balance edge of bed with B LE supported and R UE propping. Slight L lateral lean and R gaze preference noted. Stedy used for sit <>stand blocked practice with emphasis on eccentric control lowering and equal weight distribution. Without cuing, patient would have tendency to assuming strong L lateral lean, likely pushing with R UE against Stedy bar. Standing dynamic balance with L UE propped on Stedy bar and knee pads used to assist with L knee blocking. Patient able to reach outside BOS in all directions, but when reaching L, across midline with R UE, L knee would flex into Stedy knee bar and he would be able to return to midline without ModA provided by PT. Patient returning to supine with MaxA. Bed alarm on, call light within reach.   Therapy Documentation Precautions:  Precautions Precautions: Fall,Other (comment) Precaution Comments: L hemiplegia, left neglect, hx of seizures, age indeterminate B LE  DVTs Restrictions Weight Bearing Restrictions: No    Therapy/Group: Individual Therapy  Karoline Caldwell, PT, DPT, CBIS  02/22/2021, 7:37 AM

## 2021-02-23 DIAGNOSIS — I10 Essential (primary) hypertension: Secondary | ICD-10-CM

## 2021-02-23 DIAGNOSIS — I69391 Dysphagia following cerebral infarction: Secondary | ICD-10-CM

## 2021-02-23 DIAGNOSIS — I952 Hypotension due to drugs: Secondary | ICD-10-CM

## 2021-02-23 DIAGNOSIS — R350 Frequency of micturition: Secondary | ICD-10-CM

## 2021-02-23 DIAGNOSIS — G8110 Spastic hemiplegia affecting unspecified side: Secondary | ICD-10-CM

## 2021-02-23 DIAGNOSIS — N1832 Chronic kidney disease, stage 3b: Secondary | ICD-10-CM

## 2021-02-23 MED ORDER — LISINOPRIL 20 MG PO TABS
20.0000 mg | ORAL_TABLET | Freq: Every day | ORAL | Status: DC
  Administered 2021-02-24 – 2021-03-12 (×17): 20 mg via ORAL
  Filled 2021-02-23 (×17): qty 1

## 2021-02-23 NOTE — Progress Notes (Signed)
PROGRESS NOTE   Subjective/Complaints: Patient seen laying in bed this AM.  He states he slept well overnight.  He denies complaints.   ROS: Denies CP, SOB, N/V/D  Objective:   No results found. No results for input(s): WBC, HGB, HCT, PLT in the last 72 hours. No results for input(s): NA, K, CL, CO2, GLUCOSE, BUN, CREATININE, CALCIUM in the last 72 hours.  Intake/Output Summary (Last 24 hours) at 02/23/2021 1812 Last data filed at 02/23/2021 1806 Gross per 24 hour  Intake 840 ml  Output 775 ml  Net 65 ml        Physical Exam: Vital Signs Blood pressure 117/70, pulse (!) 59, temperature 97.9 F (36.6 C), resp. rate 18, height 6' (1.829 m), weight 117.6 kg, SpO2 100 %. Constitutional: No distress . Vital signs reviewed. HENT: Normocephalic.  Atraumatic. Eyes: EOMI. No discharge. Cardiovascular: No JVD.  RRR. Respiratory: Normal effort.  No stridor.  Bilateral clear to auscultation. GI: Non-distended.  BS +. Skin: Warm and dry.  Intact. Psych: Normal mood.  Normal behavior. Musc: No edema in extremities.  No tenderness in extremities. Neuro: Alert Motor:  RUE/RLE: 5/5 proximal to distal LUE/LLE: 0/5 proximal to distal Tone - MAS 3/4 in L pec, L biceps, 1 in L wrist  Assessment/Plan: 1. Functional deficits which require 3+ hours per day of interdisciplinary therapy in a comprehensive inpatient rehab setting.  Physiatrist is providing close team supervision and 24 hour management of active medical problems listed below.  Physiatrist and rehab team continue to assess barriers to discharge/monitor patient progress toward functional and medical goals  Care Tool:  Bathing    Body parts bathed by patient: Left arm,Chest,Abdomen,Right upper leg,Front perineal area,Left upper leg,Right lower leg,Face   Body parts bathed by helper: Buttocks,Left lower leg,Right arm     Bathing assist Assist Level: Moderate Assistance  - Patient 50 - 74%     Upper Body Dressing/Undressing Upper body dressing   What is the patient wearing?: Pull over shirt    Upper body assist Assist Level: Moderate Assistance - Patient 50 - 74%    Lower Body Dressing/Undressing Lower body dressing      What is the patient wearing?: Incontinence brief,Pants     Lower body assist Assist for lower body dressing: Maximal Assistance - Patient 25 - 49%     Toileting Toileting    Toileting assist Assist for toileting: Maximal Assistance - Patient 25 - 49%     Transfers Chair/bed transfer  Transfers assist     Chair/bed transfer assist level: Maximal Assistance - Patient 25 - 49% (stand pivot)     Locomotion Ambulation   Ambulation assist   Ambulation activity did not occur: Safety/medical concerns  Assist level: Independent with assistive device Assistive device: Other (comment) (3 Musketeers) Max distance: 27ft   Walk 10 feet activity   Assist  Walk 10 feet activity did not occur: Safety/medical concerns  Assist level: 2 helpers (+2max A via 3 Musketeer) Assistive device: Other (comment) (3 Musketeer)   Walk 50 feet activity   Assist Walk 50 feet with 2 turns activity did not occur: Safety/medical concerns  Walk 150 feet activity   Assist Walk 150 feet activity did not occur: Safety/medical concerns         Walk 10 feet on uneven surface  activity   Assist Walk 10 feet on uneven surfaces activity did not occur: Safety/medical concerns         Wheelchair     Assist Will patient use wheelchair at discharge?: Yes (Yes per PT long term goals) Type of Wheelchair: Manual Wheelchair activity did not occur: Safety/medical concerns         Wheelchair 50 feet with 2 turns activity    Assist            Wheelchair 150 feet activity     Assist          Blood pressure 117/70, pulse (!) 59, temperature 97.9 F (36.6 C), resp. rate 18, height 6' (1.829 m),  weight 117.6 kg, SpO2 100 %.   Medical Problem List and Plan: 1.  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  Continue Pradaxa.              -antiplatelet therapy: Aspirin 81 mg daily 3. Pain Management: Continue Tylenol as needed 4. Mood: Provide emotional support             -antipsychotic agents: N/A 5. Neuropsych: This patient is capable of making decisions on his own behalf. 6. Skin/Wound Care: Routine skin checks 7. Fluids/Electrolytes/Nutrition: Routine in and outs 8.  Seizure disorder.  Continue Keppra 500 mg twice daily  No breakthrough seizures 9.  Hypertension.    Lisinopril 40 mg daily, decreased to 20 on 3/28  Hydralazine 75 mg every 8 hours  Norvasc 10 mg daily.   Vitals:   02/23/21 0805 02/23/21 1438  BP: 116/70 117/70  Pulse: (!) 57 (!) 59  Resp:  18  Temp:  97.9 F (36.6 C)  SpO2:  100%   Relatively soft on 3/27 10.  Hypothyroidism.  Synthroid 11.  CKD stage III.  Baseline creatinine 1.83.  Creatinine 1.72 on 3/21, labs ordered for tomorrow 12.  History of tobacco abuse.  Provide counseling 13.  Hyperlipidemia: Lipitor 14. Urinary frequency/incontinence:  Started flomax HS.  UA unremarkable, urine culture pending  15. Seasonal allergies: continue Flonase.  16. Post stroke spasticity: increase Baclofen to 10mg  TID- continue as tolerating dose. Outpatient Botox.  17.  Post stroke dysphagia  D3 thins, advance diet as tolerated  LOS: 11 days A FACE TO FACE EVALUATION WAS PERFORMED  Ankit Lorie Phenix 02/23/2021, 6:12 PM

## 2021-02-23 NOTE — Progress Notes (Signed)
Incontinent of urine at 2045, made staff aware that he needed to be changed. Encouraged patient to call staff to assist with urinal, patient agreeable. At 2330 incontinent of B & B. At 0430, continent of urine. Denies pain. Left WHO in place and applied left PRAFO at 2045. BLE edema. Old wound with scab to left shin and posterior right calf. Educated on monitoring skin after discharge. Bilateral heels elevated off bed with pillows. Matthew Wise

## 2021-02-23 NOTE — Progress Notes (Signed)
Speech Language Pathology Daily Session Note  Patient Details  Name: Matthew Wise MRN: [id number] Date of Birth: 1961/08/23  Today's Date: 02/23/2021 SLP Individual Time: 2536-6440 SLP Individual Time Calculation (min): 45 min  Short Term Goals: Week 2: SLP Short Term Goal 1 (Week 2): Pt will demonstrate efficient mastication and oral clearance of regular textures a minimum of 2 sessions and no more than Min A cues required for oral clearance prior to advancement. SLP Short Term Goal 2 (Week 2): Pt will demonstrate ability to problem solve basic to mildly complex tasks with Min A verbal/visual cuess. SLP Short Term Goal 3 (Week 2): Pt will recall new and/or daily information with Min A cues. SLP Short Term Goal 4 (Week 2): Pt will attend to left side of his body and/or locate items to left of midline with Supervision A cueing. SLP Short Term Goal 5 (Week 2): Pt will detect functional errors with Min A cues. SLP Short Term Goal 6 (Week 2): Pt will alternate his between between functional tasks with Min A cues for redirection.  Skilled Therapeutic Interventions:  Pt was seen for skilled ST targeting cognitive goals.  Pt was resting in bed upon arrival but easily awakened to voice and agreeable to participating in treatment.  Pt had been incontinent of urine and was aware that his brief was wet but had not called nursing for assistance.  SLP assisted pt in bed level hygiene and donning of clean brief.  Despite being easy to awaken, pt remained lethargic throughout therapy session which pt attributed to medications.  Pt was slightly more interactive when talking on the phone with a close friend but he did need cues to cease phone conversation to resume participation in therapy.  Pt made eye contact with therapist when she was seated on his left side in ~50% of opportunities and he was able to recall activities from previous therapy sessions with min question cues.  Pt politely declined POs today.   Pt was left in bed with bed alarm set and call bell within reach.  Continue per current plan of care.    Pain Pain Assessment Pain Scale: 0-10 Pain Score: 0-No pain  Therapy/Group: Individual Therapy  Page, Selinda Orion 02/23/2021, 12:31 PM

## 2021-02-24 LAB — BASIC METABOLIC PANEL
Anion gap: 6 (ref 5–15)
BUN: 17 mg/dL (ref 6–20)
CO2: 29 mmol/L (ref 22–32)
Calcium: 9.8 mg/dL (ref 8.9–10.3)
Chloride: 104 mmol/L (ref 98–111)
Creatinine, Ser: 1.87 mg/dL — ABNORMAL HIGH (ref 0.61–1.24)
GFR, Estimated: 41 mL/min — ABNORMAL LOW (ref >60)
Glucose, Bld: 123 mg/dL — ABNORMAL HIGH (ref 70–99)
Potassium: 3.2 mmol/L — ABNORMAL LOW (ref 3.5–5.1)
Sodium: 139 mmol/L (ref 135–145)

## 2021-02-24 NOTE — Progress Notes (Signed)
PROGRESS NOTE   Subjective/Complaints:  Pt now able to move leg>arm  A bit on the left side   ROS: Denies CP, SOB, N/V/D  Objective:   No results found. No results for input(s): WBC, HGB, HCT, PLT in the last 72 hours. No results for input(s): NA, K, CL, CO2, GLUCOSE, BUN, CREATININE, CALCIUM in the last 72 hours.  Intake/Output Summary (Last 24 hours) at 02/24/2021 0853 Last data filed at 02/24/2021 0749 Gross per 24 hour  Intake 690 ml  Output 650 ml  Net 40 ml        Physical Exam: Vital Signs Blood pressure 114/70, pulse (!) 58, temperature 98.2 F (36.8 C), resp. rate 19, height 6' (1.829 m), weight 117.6 kg, SpO2 99 %.  General: No acute distress Mood and affect are appropriate Heart: Regular rate and rhythm no rubs murmurs or extra sounds Lungs: Clear to auscultation, breathing unlabored, no rales or wheezes Abdomen: Positive bowel sounds, soft nontender to palpation, nondistended Extremities: No clubbing, cyanosis, or edema Skin: No evidence of breakdown, no evidence of rash  Neuro: Alert Sensation absent LT on LUE and LLE  Motor:  RUE/RLE: 5/5 proximal to distal LUE/LLE: 2-/5 Left hip /knee ext synergy , 2- L pronator  Tone - MAS 3/4 in L pec, L biceps, 1 in L wrist  Assessment/Plan: 1. Functional deficits which require 3+ hours per day of interdisciplinary therapy in a comprehensive inpatient rehab setting.  Physiatrist is providing close team supervision and 24 hour management of active medical problems listed below.  Physiatrist and rehab team continue to assess barriers to discharge/monitor patient progress toward functional and medical goals  Care Tool:  Bathing    Body parts bathed by patient: Left arm,Chest,Abdomen,Right upper leg,Front perineal area,Left upper leg,Right lower leg,Face   Body parts bathed by helper: Buttocks,Left lower leg,Right arm     Bathing assist Assist Level:  Moderate Assistance - Patient 50 - 74%     Upper Body Dressing/Undressing Upper body dressing   What is the patient wearing?: Pull over shirt    Upper body assist Assist Level: Moderate Assistance - Patient 50 - 74%    Lower Body Dressing/Undressing Lower body dressing      What is the patient wearing?: Incontinence brief,Pants     Lower body assist Assist for lower body dressing: Maximal Assistance - Patient 25 - 49%     Toileting Toileting    Toileting assist Assist for toileting: Maximal Assistance - Patient 25 - 49%     Transfers Chair/bed transfer  Transfers assist     Chair/bed transfer assist level: Maximal Assistance - Patient 25 - 49% (stand pivot)     Locomotion Ambulation   Ambulation assist   Ambulation activity did not occur: Safety/medical concerns  Assist level: Independent with assistive device Assistive device: Other (comment) (3 Musketeers) Max distance: 55ft   Walk 10 feet activity   Assist  Walk 10 feet activity did not occur: Safety/medical concerns  Assist level: 2 helpers (+40max A via 3 Musketeer) Assistive device: Other (comment) (3 Musketeer)   Walk 50 feet activity   Assist Walk 50 feet with 2 turns activity did not occur: Safety/medical  concerns         Walk 150 feet activity   Assist Walk 150 feet activity did not occur: Safety/medical concerns         Walk 10 feet on uneven surface  activity   Assist Walk 10 feet on uneven surfaces activity did not occur: Safety/medical concerns         Wheelchair     Assist Will patient use wheelchair at discharge?: Yes (Yes per PT long term goals) Type of Wheelchair: Manual Wheelchair activity did not occur: Safety/medical concerns         Wheelchair 50 feet with 2 turns activity    Assist            Wheelchair 150 feet activity     Assist          Blood pressure 114/70, pulse (!) 58, temperature 98.2 F (36.8 C), resp. rate 19,  height 6' (1.829 m), weight 117.6 kg, SpO2 99 %.   Medical Problem List and Plan: 1.  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  Continue Pradaxa.              -antiplatelet therapy: Aspirin 81 mg daily 3. Pain Management: Continue Tylenol as needed 4. Mood: Provide emotional support             -antipsychotic agents: N/A 5. Neuropsych: This patient is capable of making decisions on his own behalf. 6. Skin/Wound Care: Routine skin checks 7. Fluids/Electrolytes/Nutrition: Routine in and outs 8.  Seizure disorder.  Continue Keppra 500 mg twice daily  No breakthrough seizures 9.  Hypertension.    Lisinopril 40 mg daily, decreased to 20 on 3/28  Hydralazine 75 mg every 8 hours  Norvasc 10 mg daily.   Vitals:   02/24/21 0338 02/24/21 0722  BP: 120/75 114/70  Pulse: 64 (!) 58  Resp: 19   Temp: 98.2 F (36.8 C)   SpO2: 99%    Relatively soft on 3/28 10.  Hypothyroidism.  Synthroid 11.  CKD stage III.  Baseline creatinine 1.83.  Creatinine 1.72 on 3/21, labs ordered for today  12.  History of tobacco abuse.  Provide counseling 13.  Hyperlipidemia: Lipitor 14. 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  UA unremarkable, urine culture negative   15. Seasonal allergies: continue Flonase.  16. Post stroke spasticity: increase Baclofen to 10mg  TID- continue as tolerating dose. Outpatient Botox.  17.  Post stroke dysphagia  D3 thins, advance diet as tolerated  LOS: 12 days A FACE TO FACE EVALUATION WAS PERFORMED  Charlett Blake 02/24/2021, 8:53 AM

## 2021-02-24 NOTE — Progress Notes (Signed)
Speech Language Pathology Daily Session Note  Patient Details  Name: Matthew Wise MRN: [id number] Date of Birth: 08-24-1961  Today's Date: 02/24/2021 SLP Individual Time: 1003-1057 SLP Individual Time Calculation (min): 54 min  Short Term Goals: Week 2: SLP Short Term Goal 1 (Week 2): Pt will demonstrate efficient mastication and oral clearance of regular textures a minimum of 2 sessions and no more than Min A cues required for oral clearance prior to advancement. SLP Short Term Goal 2 (Week 2): Pt will demonstrate ability to problem solve basic to mildly complex tasks with Min A verbal/visual cuess. SLP Short Term Goal 3 (Week 2): Pt will recall new and/or daily information with Min A cues. SLP Short Term Goal 4 (Week 2): Pt will attend to left side of his body and/or locate items to left of midline with Supervision A cueing. SLP Short Term Goal 5 (Week 2): Pt will detect functional errors with Min A cues. SLP Short Term Goal 6 (Week 2): Pt will alternate his between between functional tasks with Min A cues for redirection.  Skilled Therapeutic Interventions: Skilled SLP intervention focused on cognition. Left inattention tasks completed using cards. Pt increased accuracy with locating cards in left visual field with verbal cue to "look more left". He demonstrated decreased awareness of errors when locating cards out of chronological order and needed visual cue to recognize errors. He completed mental time calculations using printed schedule in room with mod A verbal cues. Pt left seated upright in bed with bed alarm set and call bell within reach. Cont with therapy per plan of care.      Pain Pain Assessment Pain Scale: Faces Pain Score: 0-No pain Faces Pain Scale: No hurt  Therapy/Group: Individual Therapy  Matthew Wise 02/24/2021, 10:49 AM

## 2021-02-24 NOTE — Progress Notes (Signed)
Occupational Therapy Session Note  Patient Details  Name: Matthew Wise MRN: [id number] Date of Birth: December 11, 1960  Today's Date: 02/24/2021 OT Individual Time: 1445-1530 OT Individual Time Calculation (min): 45 min   Short Term Goals: Week 2:  OT Short Term Goal 1 (Week 2): Pt will donn a pullover shirt with min assist and mod demonstrational cueing for hemi dressing technique. OT Short Term Goal 2 (Week 2): Pt will complete squat pivot toilet transfer to drop arm commode with mod assist. OT Short Term Goal 3 (Week 2): Pt will donn pants with max assist sit to stand with use of AE PRN.  Skilled Therapeutic Interventions/Progress Updates:    Pt greeted reclined in TIS wc and agreeable to OT treatment session. Pt declined any bathing but did want to change gowns. Worked on L visual scanning and hemi dressing when changing gown. Pt brought down to therapy gym and worked on sit<>stands at standing frame with mod A +2. L UE NMR with weight bearing towel pushes in standing. 3 sets of 30. PT needed OT assist to pull UE back, but then could actively push. Pt took seated rest breaks in between stands. Pt returned to room and completed Stedy transfer back to bed. Pt left semi-reclined in bed with needs met.  Therapy Documentation Precautions:  Precautions Precautions: Fall,Other (comment) Precaution Comments: L hemiplegia, left neglect, hx of seizures, age indeterminate B LE DVTs Restrictions Weight Bearing Restrictions: No Pain:  denies pain  Therapy/Group: Individual Therapy  Valma Cava 02/24/2021, 3:08 PM

## 2021-02-24 NOTE — Progress Notes (Addendum)
Physical Therapy Session Note  Patient Details  Name: Matthew Wise MRN: [id number] Date of Birth: 02/03/1961  Today's Date: 02/24/2021 PT Individual Time: 0102-0200 PT Individual Time Calculation (min): 58 min   Short Term Goals: Week 2:  PT Short Term Goal 1 (Week 2): Pt will perform sit to stand with mod assist. PT Short Term Goal 2 (Week 2): Pt will perform bed to chair transfers with mod assist. PT Short Term Goal 3 (Week 2): Pt will perform gait with LRAD for 40 ft +2 mod assist. PT Short Term Goal 4 (Week 2): Pt will perform supine to sit and sit to supine with mod assist.  Skilled Therapeutic Interventions/Progress Updates:    Pt received supine in bed. Pt did not report any pain and agreeable to therapy. Pt donned pants in supine max assist, able to minimally actively flex hip to assist. Transferred min assist to L sidelying via verbal cuing tobend R knee and push towards L side, and grab/pull L bed rail with RUE. Sidelying to sitting modassist with verbal cuing to push with RUE. Pt continues to ER LLE, but able to actively assist in repositioning to neutral; still has notable hip ER tightness with significant resistance into neutral alignment. LLE WBNMRvia STS mod assist with verbal and tactile cuing to posterior pelvic tilt and extend hips in order to complete donning pants. Able to turn with mod assist and sit in WC. Wheeled to therapy gym. Litegait harness donned max assist and gait training performed on treadmill with PT on involved L side and rehab tech on unaffected R leg. Starting speed = .31mph with 100% manual assistance required to advance L leg, and repeated verbal cuing in conjunction with continuous manual assistance required to advance R leg (likely due to inattention and lethargy). L leg maintained ER throughout stance time even with PT repositioning. Speed reduced to .47mph after sitting rest break to reduce required cognitive sequencing speed. Pt able to achieve greater  success but continued to show same impairments. Repeated one additional time after seated rest break at .11mph with +3 assist behind pt on treadmill to coordinate reciprocal hip shifting onto stance leg. Significantly greater success noted with sequencing pattern, but L ER throughout stance remained. Totality of gait training = 9 min (156ft), and pt demonstrated bilateral crouched gait throughout with inability to extend knee particularly in L push off. Pt walked backwards on treadmill and lowered into WC. Wheeled back to room and left seated in WC. Seat belt alarm turned on and needs within reach.  Therapy Documentation Precautions:  Precautions Precautions: Fall,Other (comment) Precaution Comments: L hemiplegia, left neglect, hx of seizures, age indeterminate B LE DVTs Restrictions Weight Bearing Restrictions: No   Therapy/Group: Individual Therapy  Eleonore Chiquito, SPT 02/24/2021, 2:10 PM
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
## 2021-02-25 ENCOUNTER — Ambulatory Visit: Payer: Medicare Other | Admitting: Neurology

## 2021-02-25 ENCOUNTER — Encounter: Payer: Self-pay | Admitting: Neurology

## 2021-02-25 DIAGNOSIS — Z029 Encounter for administrative examinations, unspecified: Secondary | ICD-10-CM

## 2021-02-25 NOTE — Progress Notes (Signed)
Occupational Therapy Session Note  Patient Details  Name: Matthew Wise MRN: [id number] Date of Birth: 27-Sep-1961  Today's Date: 02/25/2021 OT Individual Time: 1300-1400 OT Individual Time Calculation (min): 60 min    Short Term Goals: Week 2:  OT Short Term Goal 1 (Week 2): Pt will donn a pullover shirt with min assist and mod demonstrational cueing for hemi dressing technique. OT Short Term Goal 2 (Week 2): Pt will complete squat pivot toilet transfer to drop arm commode with mod assist. OT Short Term Goal 3 (Week 2): Pt will donn pants with max assist sit to stand with use of AE PRN.  Skilled Therapeutic Interventions/Progress Updates:    Patient in bed, alert, denies pain.  LB bathing and dressing completed bed level - max A overall.  Able to roll left with min A, max A to roll to the right.  Side lying to sitting edge of bed max A.  Cues for midline sitting.  Sit to stand in stedy min A.  Utilized stedy to w/c.  Completed oral care set up assist seated at sink, UB bathing mod A, UB dressing mod A with increased time.  Left UE inhibition and stretching completed w/c level, trunk control and midline orientation, note active elbow extension and trace shoulder.  He remained seated in w/c - tilted position, seat belt alarm set and call bell in hand  Therapy Documentation Precautions:  Precautions Precautions: Fall,Other (comment) Precaution Comments: L hemiplegia, left neglect, hx of seizures, age indeterminate B LE DVTs Restrictions Weight Bearing Restrictions: No   Therapy/Group: Individual Therapy  Carlos Levering 02/25/2021, 7:49 AM

## 2021-02-25 NOTE — Progress Notes (Addendum)
Physical Therapy Session Note  Patient Details  Name: Matthew Wise MRN: [id number] Date of Birth: 31-Oct-1961  Today's Date: 02/25/2021 PT Individual Time: 0215-0327 PT Individual Time Calculation (min): 72 min   Short Term Goals: Week 2:  PT Short Term Goal 1 (Week 2): Pt will perform sit to stand with mod assist. PT Short Term Goal 2 (Week 2): Pt will perform bed to chair transfers with mod assist. PT Short Term Goal 3 (Week 2): Pt will perform gait with LRAD for 40 ft +2 mod assist. PT Short Term Goal 4 (Week 2): Pt will perform supine to sit and sit to supine with mod assist.  Skilled Therapeutic Interventions/Progress Updates:    Pt received sitting in WC. No report of pain and agreeable to therapy, yet seemed increasingly lethargic and fatigued during session as compared to usual. Pt wheeled to therapy gym. NMR via STS max assist to promote LLE WB performed 5x without rest break. L knee blocking to counter hyperextension and tactile cuing to extend hips. Pt continues to demonstrate strong L lean. Pt donned Maxi Sky max assist and NMR via standing performed to promote midline re-orientation. Performed 2x76min with cuing to lean into +1 tech on R side to counter L lean with physical resistance provided to push into. Pt was responsive to this cuing but quickly returned to L leaning. Standing activity with cone stacking on R side of body used as reaching activity to further promote R weight shift; fairly successful in accordance with pt tolerance until fatigue was reached. Cone distance positioned posterolateral to also encourage better trunk posture (to decrease thoracic flexion). Performed 3x54min. Pre-gait NMR via stance on R leg (to promote R weight shift) with forward/backward stepping with LLE to from cone (external cue). Required initiation with knee flex but able to perform most of movement independently. Pt wheeled back to room and transferred L to sitting EOB squat pivotmax assist +2 due  to fatigue with verbal cuing to lean towards Rside into therapist and therapist facilitating LLE WB for NMR. Transferred to supine max assist +2. Needs within reach and bed alarm turned on.  Therapy Documentation Precautions:  Precautions Precautions: Fall,Other (comment) Precaution Comments: L hemiplegia, left neglect, hx of seizures, age indeterminate B LE DVTs Restrictions Weight Bearing Restrictions: No    Therapy/Group: Individual Therapy  Eleonore Chiquito, SPT 02/25/2021, 7:49 AM

## 2021-02-25 NOTE — Progress Notes (Signed)
Speech Language Pathology Daily Session Note  Patient Details  Name: Matthew Wise MRN: [id number] Date of Birth: 1961/01/06  Today's Date: 02/25/2021 SLP Individual Time: 1001-1059 SLP Individual Time Calculation (min): 58 min  Short Term Goals: Week 2: SLP Short Term Goal 1 (Week 2): Pt will demonstrate efficient mastication and oral clearance of regular textures a minimum of 2 sessions and no more than Min A cues required for oral clearance prior to advancement. SLP Short Term Goal 2 (Week 2): Pt will demonstrate ability to problem solve basic to mildly complex tasks with Min A verbal/visual cuess. SLP Short Term Goal 3 (Week 2): Pt will recall new and/or daily information with Min A cues. SLP Short Term Goal 4 (Week 2): Pt will attend to left side of his body and/or locate items to left of midline with Supervision A cueing. SLP Short Term Goal 5 (Week 2): Pt will detect functional errors with Min A cues. SLP Short Term Goal 6 (Week 2): Pt will alternate his between between functional tasks with Min A cues for redirection.  Skilled Therapeutic Interventions: Skilled SLP intervention focused on cognition and left inattention. Pt required verbal cues "scan to your left" to scan and locate items on large grocery store sale ad and with trail making task with numbers 1-20. Less visual cues required today with scanning but he continues to require increased time with information in left field and visual cues to notice errors with written information in left visual field. Pt provided with written list of medications for reading task. He increased accuracy with scanning with bright orange line on left side of page to increase scanning far left. Money counting task with coins and bills completed with min A verbal cues for minor errors he made with mistaking silver coins.Pt left seated upright in bed with bed alarm set and call bell within reach. Cont therapy per goals.      Pain Pain Assessment Pain  Scale: Faces Faces Pain Scale: No hurt  Therapy/Group: Individual Therapy  Darrol Poke Levie Wages 02/25/2021, 10:51 AM

## 2021-02-25 NOTE — Consult Note (Signed)
Neuropsychological Consultation   Patient:   Matthew Wise   DOB:   [birth year]  MR Number:  [id number]  Location:  [place] [id number] [place] [address] Dept: [place]: [phone]           Date of Service:   02/24/2021  Start Time:   11 AM End Time:   12 PM  Provider/Observer:  Ilean Skill, Psy.D.       Clinical Neuropsychologist       Billing Code/Service: [phone]  Chief Complaint:    Matthew Wise is a 60 year old right-handed male with history of pulmonary emboli, hypertension, chronic kidney disease stage III, hyperlipidemia, prediabetes, sickle cell disease, tobacco abuse as well as right MCA infarction with recently admitted status 01/21/2021 to 01/27/2021.  Patient was discharged home ambulating 150 feet on a rolling walker.  Patient presented on 02/03/2021 with left hemiparesis of subacute onset.  Cranial CT scan was unremarkable for acute intracranial process.  Suspect acute infarct of the right frontal parietal lobe posterior superior to chronic infarction with probable involvement of lateral precentral and postcentral gyri.  Patient did not receive TPA.  CT angiogram of head neck showed abrupt occlusion of the right M1 segment that was new as compared to prior CTA of 01/25/2021.  Acute infarct of the posterior right frontal parietal region largely corresponding with the completed infarct as seen on prior MRI.  EEG showed evidence of focal seizure arising from right posterior quadrant during which patient was noted to have left upper extremity twitching.  There was also cortical dysfunction in the right frontal temporal region likely due to underlying stroke.  Patient underwent arteriogram with revascularization of right MCA M1 segment per IR.  Follow-up MRI showed right MCA territory infarct with hemorrhage and minimal leftward midline shift as well as small acute left  cerebellar infarct new small acute early subacute infarct in left occipital/left parietal lobe and left frontal lobes.  Patient also identified with age-indeterminate DVT as well.  Reason for Service:  Patient was referred for neuropsychological consultation due to coping and adjustment.  Below see HPI for the current admission.  HPI: Matthew Wise is a 60 year old right-handed male with history of pulmonary emboli maintained on Eliquis, hypertension, CKD stage III, hyperlipidemia, prediabetes, sickle cell disease, tobacco abuse as well as right MCA infarction recently admitted 01/21/2021 to 01/27/2021 and discharged home ambulating 150 feet rolling walker.  History taken from chart review and patient.  Patient lives alone.  1 level home 3 steps to entry.  Independent with assistive device.  He has a sister who checks on him regularly.  He presented on 02/03/2021 with left hemiparesis, subacute onset.  Crania CT scan unremarkable for acute intracranial process.  Suspect acute infarct of the right frontal parietal lobe posterior superior to chronic infarct with probable involvement of lateral precentral and postcentral gyri.  Patient did not receive TPA.  CT angiogram of head and neck showed abrupt occlusion of the right M1 segment new as compared to prior CTA of 01/25/2021.  Acute core infarct of the posterior right frontal parietal region largely corresponding with a completed infarct as seen on prior MRI.  EEG evidence of focal seizure arising from the right posterior quadrant during which patient was noted to have left upper extremity twitching.  Additionally there was cortical dysfunction in the right frontal temporal region likely due to underlying stroke.  Most recent echocardiogram with  ejection fraction 55 to 60%.  Admission chemistries unremarkable except creatinine 1.83, urine drug screen negative, hemoglobin A1c 5.9.  Alcohol negative.  Patient underwent arteriogram with revascularization of right MCA  M1 segment per interventional radiology.  Latest follow-up MRI showed large acute right MCA territory infarct with petechial hemorrhage and minimal leftward midline shift as well as small acute left cerebellar infarct new small acute early subacute infarct in left occipital left parietal left frontal lobes.  Bilateral extremity Dopplers findings consistent with age-indeterminate DVT involving the right common femoral vein and right femoral vein as well as left femoral vein and left popliteal vein, left common femoral and SFA junction.  Patient was placed on intravenous heparin remained on low-dose aspirin and Pradaxa was added.  He had been loaded for Keppra for his suspected seizure.  Hospital course further complicated by dysphagia, started on Dysphagia #3 thin liquid diet.  Therapy evaluations completed patient was admitted due to left-sided hemiparesis for a comprehensive rehab program. Please see preadmission assessment from earlier today as well.   Current Status:  Patient acknowledged changes in cognition and difficulty with particular motor function in his left hemisphere.  He describes this as acute onset.  Patient was very distressed by his lack of mobility in his left arm although he did have some movement in his left leg.  Patient reports that he had been able to stand with assistance.  Patient is motivated to continue to make improvements.  Patient had question about whether his sickle cell played a role in him having a stroke versus other factors.  Behavioral Observation: Matthew Wise  presents as a 60 y.o.-year-old Right African American Male who appeared his stated age. his dress was Appropriate and he was Well Groomed and his manners were Appropriate to the situation.  his participation was indicative of Appropriate and Redirectable behaviors.  There were physical disabilities noted.  he displayed an appropriate level of cooperation and motivation.     Interactions:    Active Appropriate  and Redirectable  Attention:   abnormal and attention span appeared shorter than expected for age  Memory:   within normal limits; recent and remote memory intact  Visuo-spatial:  not examined  Speech (Volume):  normal  Speech:   normal; normal  Thought Process:  Coherent and Relevant  Though Content:  WNL; not suicidal and not homicidal  Orientation:   person, place and situation  Judgment:   Fair  Planning:   Poor  Affect:    Depressed and Lethargic  Mood:    Dysphoric  Insight:   Fair  Intelligence:   normal  Medical History:   Past Medical History:  Diagnosis Date  . Acute CVA (cerebrovascular accident) ([facility]) 09/09/2020  . Chronic anticoagulation 11/14/2014  . CKD (chronic kidney disease) stage 3, GFR 30-59 ml/min (HCC) 10/24/2019  . CKD (chronic kidney disease) stage 3, GFR 30-59 ml/min (HCC) 10/24/2019  . Clotting disorder ([facility] [place])   . 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 DVT (deep venous thrombosis) ([facility]) 06/15/2011  . DVT of leg (deep venous thrombosis) (HCC)    rt. leg  . Essential hypertension 11/28/2020  . H/O PE (pulmonary embolism) 06/15/2011  . Hyperlipidemia 11/28/2020  . 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 Prediabetes 10/24/2019  . Thyroid nodule 01/25/2021         Patient Active Problem List   Diagnosis Date Noted  .  Urinary frequency   . Dysphagia, post-stroke   . Spastic hemiplegia affecting nondominant side ([place])   . Drug-induced hypotension   . Pressure injury of skin 02/14/2021  . Right middle cerebral artery stroke ([facility]) 02/12/2021  . Dyslipidemia   . Hemiparesis affecting left side as late effect of stroke ([facility])   . Middle cerebral artery embolism, right 02/03/2021  . Status post surgery 02/02/2021  . Stroke (cerebrum) ([facility]) 02/02/2021  . Hypoglycemia 01/25/2021  . Thyroid nodule 01/25/2021  . Hypothyroidism 01/25/2021  . Near  syncope 01/21/2021  . H/O adenomatous polyp of colon 12/30/2020  . Acute ischemic stroke ([facility]) 11/28/2020  . Essential hypertension 11/28/2020  . Mixed hyperlipidemia 11/28/2020  . Acute CVA (cerebrovascular accident) ([facility]) 09/09/2020  . Transient ischemic attack 09/07/2020  . History of hypertension 09/07/2020  . Elevated troponin 09/07/2020  . Dizziness 09/07/2020  . Numbness and tingling 09/07/2020  . H/O medication noncompliance 09/07/2020  . Medicare annual wellness visit, subsequent 12/05/2019  . Nicotine dependence 12/05/2019  . CKD (chronic kidney disease) stage 3, GFR 30-59 ml/min (HCC) 10/24/2019  . Prediabetes 10/24/2019  . Pedal edema 09/14/2017  . Tubular adenoma of colon 09/14/2017  . Chronic anticoagulation 11/14/2014  . Sickle cell trait ([facility]) 08/29/2012  . 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 DVT (deep venous thrombosis) ([facility]) 06/15/2011  . Pulmonary embolism ([place] [facility]) 06/15/2011  . 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     Psychiatric History:  Patient with no prior psychiatric history.  Family Med/Psych History:  Family History  Problem Relation Age of Onset  . COPD Father   . Stroke Brother   . Pulmonary embolism Sister   . Throat cancer Brother   . Arthritis Mother   . Diabetes Mother   . Stroke Mother   . Hypertension Brother   . Diabetes Brother   . Arthritis Brother   . Diabetes Sister   . Colon cancer Neg Hx     Impression/DX:  Matthew Wise is a 60 year old right-handed male with history of pulmonary emboli, hypertension, chronic kidney disease stage III, hyperlipidemia, prediabetes, sickle cell disease, tobacco abuse as well as right MCA infarction with recently admitted status 01/21/2021 to 01/27/2021.  Patient was discharged home ambulating 150 feet on a rolling walker.  Patient presented on 02/03/2021 with left hemiparesis of subacute onset.  Cranial CT scan was unremarkable for  acute intracranial process.  Suspect acute infarct of the right frontal parietal lobe posterior superior to chronic infarction with probable involvement of lateral precentral and postcentral gyri.  Patient did not receive TPA.  CT angiogram of head neck showed abrupt occlusion of the right M1 segment that was new as compared to prior CTA of 01/25/2021.  Acute infarct of the posterior right frontal parietal region largely corresponding with the completed infarct as seen on prior MRI.  EEG showed evidence of focal seizure arising from right posterior quadrant during which patient was noted to have left upper extremity twitching.  There was also cortical dysfunction in the right frontal temporal region likely due to underlying stroke.  Patient underwent arteriogram with revascularization of right MCA M1 segment per IR.  Follow-up MRI showed right MCA territory infarct with hemorrhage and minimal leftward midline shift as well as small acute left cerebellar infarct new small acute early subacute infarct in left occipital/left parietal lobe and left frontal lobes.  Patient also identified with age-indeterminate DVT as well.  Patient acknowledged changes in cognition and difficulty  with particular motor function in his left hemisphere.  He describes this as acute onset.  Patient was very distressed by his lack of mobility in his left arm although he did have some movement in his left leg.  Patient reports that he had been able to stand with assistance.  Patient is motivated to continue to make improvements.  Patient had question about whether his sickle cell played a role in him having a stroke versus other factors.  Disposition/Plan:  Today we worked on coping and adjustment issues around his residual effects of his recent stroke compounded on lingering impact from his past right MCA infarction.  Diagnosis:    Middle cerebral artery embolism, right - Plan: Ambulatory referral to Neurology         Electronically  Signed   _______________________ Ilean Skill, Psy.D. Clinical Neuropsychologist

## 2021-02-25 NOTE — Progress Notes (Signed)
PROGRESS NOTE   Subjective/Complaints:  No issues overnite, no pains Remains incont of bladder   ROS: Denies CP, SOB, N/V/D  Objective:   No results found. No results for input(s): WBC, HGB, HCT, PLT in the last 72 hours. Recent Labs    02/24/21 0833  NA 139  K 3.2*  CL 104  CO2 29  GLUCOSE 123*  BUN 17  CREATININE 1.87*  CALCIUM 9.8    Intake/Output Summary (Last 24 hours) at 02/25/2021 0750 Last data filed at 02/25/2021 0630 Gross per 24 hour  Intake 720 ml  Output 775 ml  Net -55 ml        Physical Exam: Vital Signs Blood pressure 121/64, pulse 65, temperature 98.3 F (36.8 C), temperature source Oral, resp. rate 18, height 6' (1.829 m), weight 117.6 kg, SpO2 100 %.   General: No acute distress Mood and affect are appropriate Heart: Regular rate and rhythm no rubs murmurs or extra sounds Lungs: Clear to auscultation, breathing unlabored, no rales or wheezes Abdomen: Positive bowel sounds, soft nontender to palpation, nondistended Extremities: No clubbing, cyanosis, or edema Skin: No evidence of breakdown, no evidence of rash   Neuro: Alert Sensation absent LT on LUE and LLE  Motor:  RUE/RLE: 5/5 proximal to distal LUE/LLE: 2-/5 Left hip /knee ext synergy , 2- L pronator  Tone - MAS 3/4 in L pec, L biceps, 1 in L wrist  Assessment/Plan: 1. Functional deficits which require 3+ hours per day of interdisciplinary therapy in a comprehensive inpatient rehab setting.  Physiatrist is providing close team supervision and 24 hour management of active medical problems listed below.  Physiatrist and rehab team continue to assess barriers to discharge/monitor patient progress toward functional and medical goals  Care Tool:  Bathing    Body parts bathed by patient: Left arm,Chest,Abdomen,Right upper leg,Front perineal area,Left upper leg,Right lower leg,Face   Body parts bathed by helper: Buttocks,Left  lower leg,Right arm     Bathing assist Assist Level: Moderate Assistance - Patient 50 - 74%     Upper Body Dressing/Undressing Upper body dressing   What is the patient wearing?: Pull over shirt    Upper body assist Assist Level: Moderate Assistance - Patient 50 - 74%    Lower Body Dressing/Undressing Lower body dressing      What is the patient wearing?: Incontinence brief,Pants     Lower body assist Assist for lower body dressing: Maximal Assistance - Patient 25 - 49%     Toileting Toileting    Toileting assist Assist for toileting: Maximal Assistance - Patient 25 - 49%     Transfers Chair/bed transfer  Transfers assist     Chair/bed transfer assist level: Moderate Assistance - Patient 50 - 74%     Locomotion Ambulation   Ambulation assist   Ambulation activity did not occur: Safety/medical concerns  Assist level: Total Assistance - Patient < 25% Assistive device: Lite Gait Max distance: 146ft   Walk 10 feet activity   Assist  Walk 10 feet activity did not occur: Safety/medical concerns  Assist level: 2 helpers (+11max A via 3 Musketeer) Assistive device: Other (comment) (3 Musketeer)   Walk 50 feet activity  Assist Walk 50 feet with 2 turns activity did not occur: Safety/medical concerns         Walk 150 feet activity   Assist Walk 150 feet activity did not occur: Safety/medical concerns         Walk 10 feet on uneven surface  activity   Assist Walk 10 feet on uneven surfaces activity did not occur: Safety/medical concerns         Wheelchair     Assist Will patient use wheelchair at discharge?: Yes (Yes per PT long term goals) Type of Wheelchair: Manual Wheelchair activity did not occur: Safety/medical concerns         Wheelchair 50 feet with 2 turns activity    Assist            Wheelchair 150 feet activity     Assist          Blood pressure 121/64, pulse 65, temperature 98.3 F (36.8 C),  temperature source Oral, resp. rate 18, height 6' (1.829 m), weight 117.6 kg, SpO2 100 %.   Medical Problem List and Plan: 1.  Left-sided hemiplegia, now with spasticity secondary to right MCA infarct likely atheroembolic status post thrombectomy as well as posterior frontoparietal region infarct              WHO/PRAFO qhs  Continue CIR PT, OT, SLP team conf in am 2.  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  Continue Pradaxa.              -antiplatelet therapy: Aspirin 81 mg daily 3. Pain Management: Continue Tylenol as needed 4. Mood: Provide emotional support             -antipsychotic agents: N/A 5. Neuropsych: This patient is capable of making decisions on his own behalf. 6. Skin/Wound Care: Routine skin checks 7. Fluids/Electrolytes/Nutrition: Routine in and outs 8.  Seizure disorder.  Continue Keppra 500 mg twice daily  No breakthrough seizures 9.  Hypertension.    Lisinopril 40 mg daily, decreased to 20 on 3/28  Hydralazine 75 mg every 8 hours  Norvasc 10 mg daily.   Vitals:   02/25/21 0312 02/25/21 0734  BP: 109/77 121/64  Pulse: 65   Resp: 18   Temp: 98.3 F (36.8 C)   SpO2: 100%    Relatively soft on 3/28 10.  Hypothyroidism.  Synthroid 11.  CKD stage III.  Baseline creatinine 1.83.  Creatinine 1.72 on 3/21, labs ordered for today  12.  History of tobacco abuse.  Provide counseling 13.  Hyperlipidemia: Lipitor 14. 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  UA unremarkable, urine culture negative   15. Seasonal allergies: continue Flonase.  16. Post stroke spasticity: increase Baclofen to 10mg  TID- continue as tolerating dose. Outpatient Botox.  17.  Post stroke dysphagia  D3 thins, advance diet as tolerated  LOS: 13 days A FACE TO FACE EVALUATION WAS PERFORMED  Charlett Blake 02/25/2021, 7:50 AM

## 2021-02-26 ENCOUNTER — Encounter: Payer: Self-pay | Admitting: Internal Medicine

## 2021-02-26 ENCOUNTER — Ambulatory Visit: Payer: Medicare Other | Admitting: Internal Medicine

## 2021-02-26 LAB — BASIC METABOLIC PANEL
Anion gap: 7 (ref 5–15)
BUN: 17 mg/dL (ref 6–20)
CO2: 29 mmol/L (ref 22–32)
Calcium: 9.9 mg/dL (ref 8.9–10.3)
Chloride: 105 mmol/L (ref 98–111)
Creatinine, Ser: 1.78 mg/dL — ABNORMAL HIGH (ref 0.61–1.24)
GFR, Estimated: 44 mL/min — ABNORMAL LOW (ref >60)
Glucose, Bld: 139 mg/dL — ABNORMAL HIGH (ref 70–99)
Potassium: 3.7 mmol/L (ref 3.5–5.1)
Sodium: 141 mmol/L (ref 135–145)

## 2021-02-26 MED ORDER — HYDRALAZINE HCL 50 MG PO TABS
50.0000 mg | ORAL_TABLET | Freq: Three times a day (TID) | ORAL | Status: DC
  Administered 2021-02-26 – 2021-03-12 (×42): 50 mg via ORAL
  Filled 2021-02-26 (×42): qty 1

## 2021-02-26 MED ORDER — POTASSIUM CHLORIDE CRYS ER 20 MEQ PO TBCR
40.0000 meq | EXTENDED_RELEASE_TABLET | Freq: Once | ORAL | Status: AC
  Administered 2021-02-26: 40 meq via ORAL
  Filled 2021-02-26: qty 2

## 2021-02-26 NOTE — Progress Notes (Signed)
Speech Language Pathology Daily Session Note  Patient Details  Name: Matthew Wise MRN: [id number] Date of Birth: 30-Jan-1961  Today's Date: 02/26/2021 SLP Individual Time: 0830-0926 SLP Individual Time Calculation (min): 56 min  Short Term Goals: Week 2: SLP Short Term Goal 1 (Week 2): Pt will demonstrate efficient mastication and oral clearance of regular textures a minimum of 2 sessions and no more than Min A cues required for oral clearance prior to advancement. SLP Short Term Goal 2 (Week 2): Pt will demonstrate ability to problem solve basic to mildly complex tasks with Min A verbal/visual cuess. SLP Short Term Goal 3 (Week 2): Pt will recall new and/or daily information with Min A cues. SLP Short Term Goal 4 (Week 2): Pt will attend to left side of his body and/or locate items to left of midline with Supervision A cueing. SLP Short Term Goal 5 (Week 2): Pt will detect functional errors with Min A cues. SLP Short Term Goal 6 (Week 2): Pt will alternate his between between functional tasks with Min A cues for redirection.  Skilled Therapeutic Interventions: Pt was seen for skilled ST targeting dysphagia and cognitive goals. SLP facilitated session with skilled observation of pt consuming his current diet breakfast tray (Dys 3/thin), as well as a regular textured snack to assess potential for upgrade. Pt demonstrated ability to clear left buccal pocketing with Supervision A verbal cues. He did demonstrate decreased awareness of some left anterior loss of regular solids, requiring verbal cues for correction. Recommend continue current diet for now, and SLP will attempt trial tray of regular textures tomorrow. SLP further facilitated session with a medication management task. He verbally recalled the functions of ~60% of his current medications with Supervision A question cues. Review and use of external aid used to recall remaining 40%. He then used list to organize a QID pill box organizer with  Moderate cues required for awareness of functional errors and problem solving their corrections. He did exhibit ability to use a visual scanning strategy to consistently scan left to right, fully, without and errors due to left inattention. Discussed impact of emergent awareness difficulties with pt, which he endorses and was receptive to skilled education regarding how it can impact functional safety and independence. Would recommend targeting this task again prior to d/c to assess carryover of strategies discussed today to increase accuracy and self-correction abilities. Pt left laying in bed with alarm set and needs within reach. Continue per current plan of care.          Pain Pain Assessment Pain Scale: 0-10 Pain Score: 0-No pain  Therapy/Group: Individual Therapy  Arbutus Leas 02/26/2021, 7:20 AM

## 2021-02-26 NOTE — Progress Notes (Signed)
Patient ID: Matthew Wise, male   DOB: 27-Jul-1961, 60 y.o.   MRN: [id number] Team Conference Report to Patient/Family  Team Conference discussion was reviewed with the patient and caregiver, including goals, any changes in plan of care and target discharge date.  Patient and caregiver express understanding and are in agreement.  The patient has a target discharge date of 03/12/21.  SW spoke with pt and sister (via telephone). Sister feels she is able to maintain pt's LOC. Sister will follow up with SW on days she is avaliable for family education.   Dyanne Iha 02/26/2021, 2:12 PM

## 2021-02-26 NOTE — Progress Notes (Signed)
Occupational Therapy Session Note  Patient Details  Name: Matthew Wise MRN: [id number] Date of Birth: 05-Sep-1961  Today's Date: 02/26/2021 OT Individual Time: 1100-1200 OT Individual Time Calculation (min): 60 min    Short Term Goals: Week 1:  OT Short Term Goal 1 (Week 1): Pt will donn a pullover shirt with min assist and mod demonstrational cueing for hemi dressing technique. OT Short Term Goal 1 - Progress (Week 1): Not met OT Short Term Goal 2 (Week 1): Pt was able to complete LB bathing with max assist sit to stand and use of AE PRN. OT Short Term Goal 2 - Progress (Week 1): Met OT Short Term Goal 3 (Week 1): Pt will complete squat pivot toilet transfer to drop arm commode with mod assist. OT Short Term Goal 3 - Progress (Week 1): Not met OT Short Term Goal 4 (Week 1): Pt will donn pants with max assist sit to stand with use of AE PRN. OT Short Term Goal 4 - Progress (Week 1): Met  Skilled Therapeutic Interventions/Progress Updates:    1:1. Pt received in bed agreeable to OT. Pt competes grooming seated at sink with all items placed on L for L attention. Edu on adaptive technique for application of toothpaste by sitting toothbrush on drain hole in sink. Pt plays checkers in sitting and standing for L attention, turn taking, sitting balance and static standing balance wit +2 as target to lean towards on L. Pt stands with MIN A to power up and up to max A to maintain midline as pt pushes in standing to L with OT facilitating knee extension. Seated at [facility] pt completes alternating # and letter search for L attention/sequence as well as sitting balnce with S. Pt completes chart puzzle on BITS with mod VC  As pt continually selects letter R of target d/t attention/L inattention deficits. Returned to room in TIS, tilted, exit alarm on an dcall light in reach  Therapy Documentation Precautions:  Precautions Precautions: Fall,Other (comment) Precaution Comments: L hemiplegia, left neglect,  hx of seizures, age indeterminate B LE DVTs Restrictions Weight Bearing Restrictions: No General:   Vital Signs:   Pain: Pain Assessment Pain Scale: 0-10 Pain Score: 0-No pain ADL: ADL Eating: Minimal assistance Where Assessed-Eating: Wheelchair Grooming: Minimal assistance Where Assessed-Grooming: Wheelchair Upper Body Bathing: Moderate assistance Where Assessed-Upper Body Bathing: Wheelchair Lower Body Bathing: Dependent Where Assessed-Lower Body Bathing: Wheelchair,Sitting at sink,Standing at sink Upper Body Dressing: Maximal assistance Where Assessed-Upper Body Dressing: Wheelchair Lower Body Dressing: Dependent Where Assessed-Lower Body Dressing: [place] at sink,Standing at sink Toileting: Dependent Where Assessed-Toileting: Research scientist (life sciences) Method: Arts development officer: Bedside commode,Grab bars Tub/Shower Transfer: Not assessed Social research officer, government: Not assessed Vision   Perception    Praxis   Exercises:   Other Treatments:     Therapy/Group: Individual Therapy  Tonny Branch 02/26/2021, 11:03 AM

## 2021-02-26 NOTE — Progress Notes (Addendum)
PROGRESS NOTE   Subjective/Complaints:  No issues overnight , no diarrhea, no pain c/os  ROS: Denies CP, SOB, N/V/D  Objective:   No results found. No results for input(s): WBC, HGB, HCT, PLT in the last 72 hours. Recent Labs    02/24/21 0833  NA 139  K 3.2*  CL 104  CO2 29  GLUCOSE 123*  BUN 17  CREATININE 1.87*  CALCIUM 9.8    Intake/Output Summary (Last 24 hours) at 02/26/2021 0921 Last data filed at 02/26/2021 0420 Gross per 24 hour  Intake 440 ml  Output 600 ml  Net -160 ml        Physical Exam: Vital Signs Blood pressure 111/73, pulse 65, temperature 98.2 F (36.8 C), resp. rate 20, height 6' (1.829 m), weight 117.6 kg, SpO2 99 %.   General: No acute distress Mood and affect are appropriate Heart: Regular rate and rhythm no rubs murmurs or extra sounds Lungs: Clear to auscultation, breathing unlabored, no rales or wheezes Abdomen: Positive bowel sounds, soft nontender to palpation, nondistended Extremities: No clubbing, cyanosis, or edema Skin: No evidence of breakdown, no evidence of rash   Neuro: Alert Sensation absent LT on LUE and LLE  Motor:  RUE/RLE: 5/5 proximal to distal LUE/LLE: 3-/5 Left hip /knee ext synergy , 2- L pronator , Tone - MAS 3/4 in L pec, L biceps, 1 in L wrist  Assessment/Plan: 1. Functional deficits which require 3+ hours per day of interdisciplinary therapy in a comprehensive inpatient rehab setting.  Physiatrist is providing close team supervision and 24 hour management of active medical problems listed below.  Physiatrist and rehab team continue to assess barriers to discharge/monitor patient progress toward functional and medical goals  Care Tool:  Bathing    Body parts bathed by patient: Left arm,Chest,Abdomen,Right upper leg,Front perineal area,Left upper leg,Right lower leg,Face   Body parts bathed by helper: Buttocks,Left lower leg,Right arm      Bathing assist Assist Level: Moderate Assistance - Patient 50 - 74%     Upper Body Dressing/Undressing Upper body dressing   What is the patient wearing?: Pull over shirt    Upper body assist Assist Level: Moderate Assistance - Patient 50 - 74%    Lower Body Dressing/Undressing Lower body dressing      What is the patient wearing?: Pants,Incontinence brief     Lower body assist Assist for lower body dressing: Maximal Assistance - Patient 25 - 49%     Toileting Toileting    Toileting assist Assist for toileting: Maximal Assistance - Patient 25 - 49%     Transfers Chair/bed transfer  Transfers assist     Chair/bed transfer assist level: Moderate Assistance - Patient 50 - 74%     Locomotion Ambulation   Ambulation assist   Ambulation activity did not occur: Safety/medical concerns  Assist level: Total Assistance - Patient < 25% Assistive device: Lite Gait Max distance: 135f   Walk 10 feet activity   Assist  Walk 10 feet activity did not occur: Safety/medical concerns  Assist level: 2 helpers (+268m A via 3 Musketeer) Assistive device: Other (comment) (3 Musketeer)   Walk 50 feet activity   Assist Walk  50 feet with 2 turns activity did not occur: Safety/medical concerns         Walk 150 feet activity   Assist Walk 150 feet activity did not occur: Safety/medical concerns         Walk 10 feet on uneven surface  activity   Assist Walk 10 feet on uneven surfaces activity did not occur: Safety/medical concerns         Wheelchair     Assist Will patient use wheelchair at discharge?: Yes (Yes per PT long term goals) Type of Wheelchair: Manual Wheelchair activity did not occur: Safety/medical concerns         Wheelchair 50 feet with 2 turns activity    Assist            Wheelchair 150 feet activity     Assist          Blood pressure 111/73, pulse 65, temperature 98.2 F (36.8 C), resp. rate 20, height 6'  (1.829 m), weight 117.6 kg, SpO2 99 %.   Medical Problem List and Plan: 1.  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 Formulized individual treatment plan based on medical history, underlying problem and comorbidities.  2.  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  Continue Pradaxa.              -antiplatelet therapy: Aspirin 81 mg daily 3. Pain Management: Continue Tylenol as needed 4. Mood: Provide emotional support             -antipsychotic agents: N/A 5. Neuropsych: This patient is capable of making decisions on his own behalf. 6. Skin/Wound Care: Routine skin checks 7. Fluids/Electrolytes/Nutrition: Routine in and outs 8.  Seizure disorder.  Continue Keppra 500 mg twice daily  No breakthrough seizures 9.  Hypertension.    Lisinopril 40 mg daily, decreased to 20 on 3/28  Hydralazine 75 mg every 8 hours  Norvasc 10 mg daily.   Vitals:   02/25/21 1954 02/26/21 0420  BP: (!) 101/48 111/73  Pulse: 68 65  Resp: 18 20  Temp: 98.5 F (36.9 C) 98.2 F (36.8 C)  SpO2: 100% 99%   Relatively soft on 3/30, reduce hydralazine  10.  Hypothyroidism.  Synthroid 11.  CKD stage III.  Baseline creatinine 1.83.  Creatinine 1.72 on 3/21, labs ordered for today  12.  History of tobacco abuse.  Provide counseling 13.  Hyperlipidemia: Lipitor 14. 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  UA unremarkable, urine culture negative   15. Seasonal allergies: continue Flonase.  16. Post stroke spasticity: increase Baclofen to 34m TID- continue as tolerating dose. Outpatient  Botox.  17.  Post stroke dysphagia  D3 thins, advance diet as tolerated  LOS: 14 days A FACE TO FACE EVALUATION WAS PERFORMED  ACharlett Blake3/30/2022, 9:21 AM

## 2021-02-26 NOTE — Patient Care Conference (Signed)
Inpatient RehabilitationTeam Conference and Plan of Care Update Date: 02/26/2021   Time: 10:46 AM    Patient Name: Matthew Wise      Medical Record Number: [id number]  Date of Birth: 25-May-1961 Sex: Male         Room/Bed: 4W19C/4W19C-01 Payor Info: Payor: [employer] park [employer] MEDICARE / Plan: [employer] MEDICARE / Product Type: *No Product type* /    Admit Date/Time:  02/12/2021  1:59 PM  Primary Diagnosis:  Right middle cerebral artery stroke [facility])  Hospital Problems: Principal Problem:   Right middle cerebral artery stroke (HCC) Active Problems:   Pressure injury of skin   Urinary frequency   Dysphagia, post-stroke   Spastic hemiplegia affecting nondominant side ([place])   Drug-induced hypotension    Expected Discharge Date: Expected Discharge Date: 03/12/21  Team Members Present: Care Coodinator Present: Dorien Chihuahua, RN, BSN, CRRN;Christina [place], [place] Nurse Present: Dorien Chihuahua, RN PT Present: Estevan Ryder, PT OT Present: Willeen Cass, OT SLP Present: Jettie Booze, CF-SLP PPS Coordinator present : Ileana Ladd, Burna Mortimer, SLP     Current Status/Progress Goal Weekly Team Focus  Bowel/Bladder             Swallow/Nutrition/ Hydration   dys 3 with thin, Supervision with this consistency.  Mod I  trials with regular tray, use of strategies for left pocketing.   ADL's   UB bathing/dressing mod A, LB bathing/dressing max A, grooming set up, sit to stand from elevated bed min A  supervision to min assist  ADL training, transfer training, left NMRE, midline orientation   Mobility   ModA/MaxA bed mobility, Mod/MaxA STS and squat pivot transfers, +2 maxA gait up to 45ft HHA, L inattention, deteriorating coordination grossly with dual tasking at any level  min assist at ambulatory level, will likely have to be downgraded  bed mobility, transfer training, L hemi NMR, L attention, gait progressions, balance, education   Communication             Safety/Cognition/  Behavioral Observations  Min A verbal cues. improvement in processing speed with semi complex problem solving tasks.  Supervision  semi complex problem solving, left inattention, awareness of errors.   Pain             Skin               Discharge Planning:  Pt discharging home with sister and family to provide care   Team Discussion: Lower extremity strength improved. Kidney function stable.  Patient on target to meet rehab goals: yes, Mod assist with upper body care and max assist for lower body care. Mod - Max assist for transfers  *See Care Plan and progress notes for long and short-term goals.   Revisions to Treatment Plan:  Regular texture diet trial Transfer training, major muscle retraining and midline orientation Downgraded goals for PT  Teaching Needs: Transfers, toileting, medications, secondary stroke risks, etc. Coordination with dual tasks   Current Barriers to Discharge: Decreased caregiver support and Home enviroment access/layout  Possible Resolutions to Barriers: Anticipate discharge at a wheelchair level Family education with sisters     Medical Summary Current Status: HTN- BPs soft, Increasing LLE strength, +dysarthria  Barriers to Discharge: Medical stability  Barriers to Discharge Comments: LE swelling due to B DVTs Possible Resolutions to Barriers/Weekly Focus: family training, adjust BP meds   Continued Need for Acute Rehabilitation Level of Care: The patient requires daily medical management by a physician with specialized training in physical medicine and rehabilitation  for the following reasons: Direction of a multidisciplinary physical rehabilitation program to maximize functional independence : Yes Medical management of patient stability for increased activity during participation in an intensive rehabilitation regime.: Yes Analysis of laboratory values and/or radiology reports with any subsequent need for medication adjustment and/or medical  intervention. : Yes   I attest that I was present, lead the team conference, and concur with the assessment and plan of the team.   Dorien Chihuahua B 02/26/2021, 2:56 PM

## 2021-02-26 NOTE — Progress Notes (Addendum)
Physical Therapy Session Note  Patient Details  Name: Matthew Wise MRN: [id number] Date of Birth: 02-May-1961  Today's Date: 02/26/2021 PT Individual Time: 1000-1029 and 1415-1445 PT Individual Time Calculation (min): 29 min and 30 min  Short Term Goals: Week 2:  PT Short Term Goal 1 (Week 2): Pt will perform sit to stand with mod assist. PT Short Term Goal 2 (Week 2): Pt will perform bed to chair transfers with mod assist. PT Short Term Goal 3 (Week 2): Pt will perform gait with LRAD for 40 ft +2 mod assist. PT Short Term Goal 4 (Week 2): Pt will perform supine to sit and sit to supine with mod assist.  Skilled Therapeutic Interventions/Progress Updates:     Session 1 Pt received asleep in supine. Pt denies pain and agreeable to therapy. Urine present in briefs required changing. Pt aware when needing to urinate and aware today that his briefs were wet, but often does not call nursing. Pericare performed and briefs donned max assist with cuing to roll both ways. Min assist to L sidelying via verbal cuing tobend R knee and push towards L side, and grab/pull L bed rail with RUE; mod assist to R sidelyingwith therapist facilitating LLE flexion into hooklying(actively able to assist with flexing L knee). Ted hose and pants donnedmax assistwith verbal cuing to lift LLE (active hip flex observed). PtLLE placed into hooklying and ptcued and able to bridge into sufficient hip extension with max assist due to L hip weakness to completefinal step ofpant donning total assist. Pt cued to roll into L sidelying min assist with same cues used previously in session. Sidelying to sitting modassist with verbal cuing to push with RUE. Pt continues to ER LLE, but able to actively assist in repositioning to neutral; still has notable hip ER tightness with significant resistance into neutral alignment. L foot re-placed to increase knee flexion to promote WBand pt transferred to [facility] towards Rsquat pivotmax  assist with verbal cuing to lean towards Rside into therapist and therapist facilitating [place] NMR. Pt able to independently extend L leg to place on leg rest today. Pt wheeled to therapy gym. STS mod assist with L knee blocking required. NMR to re-orient to midline via standing activity max assist with cone stacking on R side of body used as reaching activity to promote R weight shift as pt demonstrates strong L lean. Pt able to lean less to L side during activity, but heavily flexes forward. When cued to maintain upright posture, goes back to strong L lean; demonstrates difficulty with dual motor tasking. Pt wheeled back to room and left upright in WC. Seat belt alarm turned on and needs within reach.  Session 2  Pt received sitting in WC. Did not report any pain and agreeable to therapy. Wheeled to therapy gym. WC positioned with wall on R side of pt; STS mod assist with L knee blocking performed. NMR for re-orientation to midline to inhibit L standing lean via verbal cuing to push into wall for ~1 min; PT on L side guarding. As opposed to cone stacking activity in 1st session today, pt better able to maintain upright trunk posture while in R weight shift. Pt seated for short rest break and stood back up, and pt was significantly better able to inhibit L trunk lean. Pt cued to weight shift slowly R and L to "center" himself; increased swaying present when cognitive dual task (conversing with PT) was introduced. After sitting rest break, wall pushing isometric performed  one additional time and then pt asked to find "center" again; relatively successful in maintaining midline without excessive trunk flex. Be cautious of sudden posterior leaning; pt does this when needing to sit and does not verbalize that he is fatigued. Pt wheeled back to room and transferred L to sitting EOB squat pivotmax assist +2 due to fatigue with verbal cuing to lean towards Rside into therapist and therapist facilitating  [place] NMR. Transferred to supine max assist +2. Needs within reach and bed alarm turned on. PT conversed with pt about potential family education starting next week to evaluate family's ability to assist pt.  Therapy Documentation Precautions:  Precautions Precautions: Fall,Other (comment) Precaution Comments: L hemiplegia, left neglect, hx of seizures, age indeterminate B LE DVTs Restrictions Weight Bearing Restrictions: No    Therapy/Group: Individual Therapy  Eleonore Chiquito, SPT 02/26/2021, 10:32 AM

## 2021-02-27 NOTE — Progress Notes (Signed)
PROGRESS NOTE   Subjective/Complaints:  No issues overnight , no diarrhea, no pain c/os  ROS: Denies CP, SOB, N/V/D  Objective:   No results found. No results for input(s): WBC, HGB, HCT, PLT in the last 72 hours. Recent Labs    02/24/21 0833 02/26/21 1236  NA 139 141  K 3.2* 3.7  CL 104 105  CO2 29 29  GLUCOSE 123* 139*  BUN 17 17  CREATININE 1.87* 1.78*  CALCIUM 9.8 9.9    Intake/Output Summary (Last 24 hours) at 02/27/2021 0829 Last data filed at 02/26/2021 1900 Gross per 24 hour  Intake 360 ml  Output 200 ml  Net 160 ml        Physical Exam: Vital Signs Blood pressure 134/89, pulse 62, temperature 97.8 F (36.6 C), temperature source Oral, resp. rate 16, height 6' (1.829 m), weight 117.6 kg, SpO2 100 %.  General: No acute distress Mood and affect are appropriate Heart: Regular rate and rhythm no rubs murmurs or extra sounds Lungs: Clear to auscultation, breathing unlabored, no rales or wheezes Abdomen: Positive bowel sounds, soft nontender to palpation, nondistended Extremities: No clubbing, cyanosis, or edema Skin: No evidence of breakdown, no evidence of rash Neuro: Alert Sensation absent LT on LUE and LLE  Motor:  RUE/RLE: 5/5 proximal to distal LUE/LLE: 3-/5 Left hip /knee ext synergy , 2- L pronator , Tone - MAS 3/4 in L pec, L biceps, no L ankle clonus   Assessment/Plan: 1. Functional deficits which require 3+ hours per day of interdisciplinary therapy in a comprehensive inpatient rehab setting.  Physiatrist is providing close team supervision and 24 hour management of active medical problems listed below.  Physiatrist and rehab team continue to assess barriers to discharge/monitor patient progress toward functional and medical goals  Care Tool:  Bathing    Body parts bathed by patient: Left arm,Chest,Abdomen,Right upper leg,Front perineal area,Left upper leg,Right lower leg,Face    Body parts bathed by helper: Buttocks,Left lower leg,Right arm     Bathing assist Assist Level: Moderate Assistance - Patient 50 - 74%     Upper Body Dressing/Undressing Upper body dressing   What is the patient wearing?: Pull over shirt    Upper body assist Assist Level: Moderate Assistance - Patient 50 - 74%    Lower Body Dressing/Undressing Lower body dressing      What is the patient wearing?: Pants,Incontinence brief     Lower body assist Assist for lower body dressing: Maximal Assistance - Patient 25 - 49%     Toileting Toileting    Toileting assist Assist for toileting: Maximal Assistance - Patient 25 - 49%     Transfers Chair/bed transfer  Transfers assist     Chair/bed transfer assist level: Maximal Assistance - Patient 25 - 49%     Locomotion Ambulation   Ambulation assist   Ambulation activity did not occur: Safety/medical concerns  Assist level: Total Assistance - Patient < 25% Assistive device: Lite Gait Max distance: 139ft   Walk 10 feet activity   Assist  Walk 10 feet activity did not occur: Safety/medical concerns  Assist level: 2 helpers (+4max A via 3 Musketeer) Assistive device: Other (comment) (3  Musketeer)   Walk 50 feet activity   Assist Walk 50 feet with 2 turns activity did not occur: Safety/medical concerns         Walk 150 feet activity   Assist Walk 150 feet activity did not occur: Safety/medical concerns         Walk 10 feet on uneven surface  activity   Assist Walk 10 feet on uneven surfaces activity did not occur: Safety/medical concerns         Wheelchair     Assist Will patient use wheelchair at discharge?: Yes (Yes per PT long term goals) Type of Wheelchair: Manual Wheelchair activity did not occur: Safety/medical concerns         Wheelchair 50 feet with 2 turns activity    Assist            Wheelchair 150 feet activity     Assist          Blood pressure 134/89,  pulse 62, temperature 97.8 F (36.6 C), temperature source Oral, resp. rate 16, height 6' (1.829 m), weight 117.6 kg, SpO2 100 %.   Medical Problem List and Plan: 1.  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  Continue Pradaxa.              -antiplatelet therapy: Aspirin 81 mg daily 3. Pain Management: Continue Tylenol as needed 4. Mood: Provide emotional support             -antipsychotic agents: N/A 5. Neuropsych: This patient is capable of making decisions on his own behalf. 6. Skin/Wound Care: Routine skin checks 7. Fluids/Electrolytes/Nutrition: Routine in and outs 8.  Seizure disorder.  Continue Keppra 500 mg twice daily  No breakthrough seizures 9.  Hypertension.    Lisinopril 40 mg daily, decreased to 20 on 3/28  Hydralazine 75 mg every 8 hours  Norvasc 10 mg daily.   Vitals:   02/26/21 1955 02/27/21 0314  BP: 124/64 134/89  Pulse: 70 62  Resp: 18 16  Temp: 98.2 F (36.8 C) 97.8 F (36.6 C)  SpO2: 100% 100%   Relatively soft on 3/30, reduce hydralazine to 50mg , monitor , normotensive today   10.  Hypothyroidism.  Synthroid 11.  CKD stage III.  Baseline creatinine 1.83.  Creatinine 1.72 on 3/21, labs ordered for today  12.  History of tobacco abuse.  Provide counseling 13.  Hyperlipidemia: Lipitor 14. 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  UA unremarkable, urine culture negative   15. Seasonal allergies: continue Flonase.  16. Post stroke spasticity: increase Baclofen to 10mg  TID- continue as tolerating dose. Outpatient Botox.  17.  Post stroke dysphagia  D3 thins, advance diet as tolerated  LOS: 15 days A FACE TO FACE  EVALUATION WAS PERFORMED  Charlett Blake 02/27/2021, 8:29 AM

## 2021-02-27 NOTE — Progress Notes (Signed)
Occupational Therapy Session Note  Patient Details  Name: Matthew Wise MRN: [id number] Date of Birth: May 24, 1961  Today's Date: 02/27/2021 OT Individual Time: 1400-1500 OT Individual Time Calculation (min): 60 min    Short Term Goals: Week 1:  OT Short Term Goal 1 (Week 1): Pt will donn a pullover shirt with min assist and mod demonstrational cueing for hemi dressing technique. OT Short Term Goal 1 - Progress (Week 1): Not met OT Short Term Goal 2 (Week 1): Pt was able to complete LB bathing with max assist sit to stand and use of AE PRN. OT Short Term Goal 2 - Progress (Week 1): Met OT Short Term Goal 3 (Week 1): Pt will complete squat pivot toilet transfer to drop arm commode with mod assist. OT Short Term Goal 3 - Progress (Week 1): Not met OT Short Term Goal 4 (Week 1): Pt will donn pants with max assist sit to stand with use of AE PRN. OT Short Term Goal 4 - Progress (Week 1): Met Week 2:  OT Short Term Goal 1 (Week 2): Pt will donn a pullover shirt with min assist and mod demonstrational cueing for hemi dressing technique. OT Short Term Goal 2 (Week 2): Pt will complete squat pivot toilet transfer to drop arm commode with mod assist. OT Short Term Goal 3 (Week 2): Pt will donn pants with max assist sit to stand with use of AE PRN.  Skilled Therapeutic Interventions/Progress Updates:    1:1 NMR with focus on normal patterns of transitional movements with functional activation in left Ue and left LE. Focus on transfers to and from the mat<> regulated arm chair with emphasis on placement of both feet and performing stand/ squat pivot with control in both directions. Performed sit to stand from EOB with mirror for visual feedback to maintain upright posture/ control with mod A +2 for safety. Sit to stands progressed to use of right hand to help stand to no hands. Focused on upright posture with activating left Le in terminal hip and knee extension to then progress to stepping forward and  back with right LE with max multimodal cues.   Transitioned into reclined sitting on the EOB for prolonged hip extension stretch while focus on activating Left Le in PNF. Need A to guide through the ROM but pt could initiate but difficulty with sustained activation through range.   Transferred ot the bed with mod A with extra time and mod A to get into supine. Left resting in the bed.     Therapy Documentation Precautions:  Precautions Precautions: Fall,Other (comment) Precaution Comments: L hemiplegia, left neglect, hx of seizures, age indeterminate B LE DVTs Restrictions Weight Bearing Restrictions: No Pain: Pain Assessment Pain Scale: 0-10 Pain Score: 0-No pain   Therapy/Group: Individual Therapy  Willeen Cass [facility] 02/27/2021, 3:33 PM

## 2021-02-27 NOTE — Progress Notes (Signed)
Occupational Therapy Session Note  Patient Details  Name: Matthew Wise MRN: [id number] Date of Birth: May 07, 1961  Today's Date: 02/27/2021 OT Individual Time: 1101-1205 OT Individual Time Calculation (min): 64 min    Short Term Goals: Week 2:  OT Short Term Goal 1 (Week 2): Pt will donn a pullover shirt with min assist and mod demonstrational cueing for hemi dressing technique. OT Short Term Goal 2 (Week 2): Pt will complete squat pivot toilet transfer to drop arm commode with mod assist. OT Short Term Goal 3 (Week 2): Pt will donn pants with max assist sit to stand with use of AE PRN.  Skilled Therapeutic Interventions/Progress Updates:    Pt completed supine to sit EOB to start session with mod assist.  He then worked on changing brief, washing front and back peri area, and donning new brief and scrubs.  Mod assist was needed for sit to stand with max assist for threading items over the LEs following hemi techniques and for pulling over his hips.  He was able to then complete squat pivot transfer to the wheelchair with mod assist in order to transfer down to the gym.  Worked on LUE functional movement and selective attention to begin session.  He was able to use the LUE for pushing a tilted stool to target with max instructional cueing for sustained attention.  Decreased initiation also noted with task as he would turn his eyes to other people in the gym and non maintain visual attention to the left.  Transitioned to sit to stand and standing while having him reach for clothespins with the RUE and then place them up to the right to encourage activation of left knee extension and weightshifting to the right.  He continues to demonstrate some increased pushing to the left side in standing with decreased ability to maintain upright lumbar extension as well as left hip and knee extension.  Mod assist needed to maintain standing during task.  Finished session with return to the room and pt staying up  in the wheelchair with the call button and phone in reach and safety belt in place.    Therapy Documentation Precautions:  Precautions Precautions: Fall,Other (comment) Precaution Comments: L hemiplegia, left neglect, hx of seizures, age indeterminate B LE DVTs Restrictions Weight Bearing Restrictions: No  Pain: Pain Assessment Pain Scale: 0-10 Pain Score: 0-No pain ADL: See Care Tool Section for some details of mobility and selfcare  Therapy/Group: Individual Therapy  Delana Manganello,[place] OTR/L 02/27/2021, 3:42 PM

## 2021-02-27 NOTE — Progress Notes (Signed)
Speech Language Pathology Weekly Progress and Session Note  Patient Details  Name: Matthew Wise MRN: [id number] Date of Birth: [birth year]  Beginning of progress report period: February 20, 2021 End of progress report period: February 27, 2021  Today's Date: 02/27/2021 SLP Individual Time: 1300-1355 SLP Individual Time Calculation (min): 55 min  Short Term Goals: Week 2: SLP Short Term Goal 1 (Week 2): Pt will demonstrate efficient mastication and oral clearance of regular textures a minimum of 2 sessions and no more than Min A cues required for oral clearance prior to advancement. SLP Short Term Goal 1 - Progress (Week 2): Met SLP Short Term Goal 2 (Week 2): Pt will demonstrate ability to problem solve basic to mildly complex tasks with Min A verbal/visual cuess. SLP Short Term Goal 2 - Progress (Week 2): Met SLP Short Term Goal 3 (Week 2): Pt will recall new and/or daily information with Min A cues. SLP Short Term Goal 3 - Progress (Week 2): Met SLP Short Term Goal 4 (Week 2): Pt will attend to left side of his body and/or locate items to left of midline with Supervision A cueing. SLP Short Term Goal 4 - Progress (Week 2): Progressing toward goal SLP Short Term Goal 5 (Week 2): Pt will detect functional errors with Min A cues. SLP Short Term Goal 5 - Progress (Week 2): Progressing toward goal SLP Short Term Goal 6 (Week 2): Pt will alternate his between between functional tasks with Min A cues for redirection. SLP Short Term Goal 6 - Progress (Week 2): Met    New Short Term Goals: Week 3: SLP Short Term Goal 1 (Week 3): Pt will demonsrate efficient mastication and oral clearance of regular textured solids Mod I for use of compensatory swallow strategies (for clearance of buccal pocketing). SLP Short Term Goal 2 (Week 3): Pt will demonstrate ability to problem solve mildly complex to complex tasks with Supervision A verbal/visual cuess. SLP Short Term Goal 3 (Week 3): Pt will recall new  and/or daily information with Supervisoin A cues. SLP Short Term Goal 4 (Week 3): Pt will attend to left side of his body and/or locate items to left of midline with Supervision A cueing. SLP Short Term Goal 5 (Week 3): Pt will detect functional errors with Min A cues. SLP Short Term Goal 6 (Week 3): Pt will alternate his between between functional tasks with Supervision A cues for redirection.  Weekly Progress Updates: Pt has made functional gains and met 4 out of 6 short term goals. Pt is currently mostly Min but sometimes Mod assist for mildly complex to complex tasks due to cognitive impairments impacting his emergent awareness, functional problem solving, short term memory, and higher levels of attention. His verbal problem solving and awareness skills are greater than functional, and requires the most cueing for awareness of errors at this time. Pt also has left visual inattention, requiring mostly Min A cues for scanning. Pt was upgraded to a regular texture diet with thin liquids and supervision decreased to intermittent. He still presents with mild left buccal pocketing, but is using strategies for oral clearance with only Supervision A or less. Pt has demonstrated improved problem solving and carryover/short term recall this week, as well as oral swallow function and awareness of pocketing and carryover of compensatory swallow strategies. Pt education is ongoing; no family has been present for ST sessions to date. Pt would continue to benefit from skilled ST while inpatient in order to maximize functional independence and reduce  burden of care prior to discharge. Anticipate that pt will need 24/7 supervision at discharge in addition to [facility] follow up at next level of care.       Intensity: Minumum of 1-2 x/day, 30 to 90 minutes Frequency: 3 to 5 out of 7 days Duration/Length of Stay: 03/12/21 Treatment/Interventions: Cognitive remediation/compensation;Cueing hierarchy;Dysphagia/aspiration  precaution training;Functional tasks;Patient/family education;Internal/external aids   Daily Session  Skilled Therapeutic Interventions: Pt was seen for skilled ST targeting dysphagia and cognitive goals. SLP facilitated session with advanced trial of regular texture lunch tray with thin liquids. Pt demonstrated efficient mastication and oral clearance of very minimal left buccal pocketing of regular solids with use of compensatory swallow strategies Mod I. No overt s/sx aspiration observed. Pt did require Supervision A cueing for problem solving how to keep PO from getting pushed off left side of plate. Recommend pt upgrade to regular textures, continue thin liquids, decrease to intermittent supervision (in addition to set up assistance). SLP further facilitated session with a semi-complex deductive reasoning puzzle (Jan's closet), during which pt required Min A verbal and visual cues for problem solving and recall within task, but increased Mod A for organization and accuracy/scanning when reading. Pt verbally recalled 4/9 functions of medications without cues, overall Min A verbal cues provided for recall of remaining 5 functions. Pt left sitting in tilt in space chair with alarm set and needs within reach. Continue per current plan of care.            Pain Pain Assessment Pain Scale: 0-10 Pain Score: 0-No pain  Therapy/Group: Individual Therapy  Arbutus Leas 02/27/2021, 7:24 AM

## 2021-02-28 MED ORDER — TIZANIDINE HCL 2 MG PO TABS
2.0000 mg | ORAL_TABLET | Freq: Three times a day (TID) | ORAL | Status: DC
  Administered 2021-02-28 – 2021-03-05 (×16): 2 mg via ORAL
  Filled 2021-02-28 (×17): qty 1

## 2021-02-28 NOTE — Progress Notes (Addendum)
PROGRESS NOTE   Subjective/Complaints:  Asking for urinal , needs help with clothing management but able to [facility] urinal   ROS: Denies CP, SOB, N/V/D  Objective:   No results found. No results for input(s): WBC, HGB, HCT, PLT in the last 72 hours. Recent Labs    02/26/21 1236  NA 141  K 3.7  CL 105  CO2 29  GLUCOSE 139*  BUN 17  CREATININE 1.78*  CALCIUM 9.9    Intake/Output Summary (Last 24 hours) at 02/28/2021 0840 Last data filed at 02/28/2021 0801 Gross per 24 hour  Intake 1050 ml  Output 300 ml  Net 750 ml        Physical Exam: Vital Signs Blood pressure 119/71, pulse 70, temperature (!) 97.3 F (36.3 C), temperature source Oral, resp. rate 14, height 6' (1.829 m), weight 117.6 kg, SpO2 100 %.   General: No acute distress Mood and affect are appropriate Heart: Regular rate and rhythm no rubs murmurs or extra sounds Lungs: Clear to auscultation, breathing unlabored, no rales or wheezes Abdomen: Positive bowel sounds, soft nontender to palpation, nondistended Extremities: No clubbing, cyanosis, or edema Skin: No evidence of breakdown, no evidence of rash Neuro: Alert Sensation absent LT on LUE and LLE  Motor:  RUE/RLE: 5/5 proximal to distal LUE/LLE: 3-/5 Left hip /knee ext synergy , 2- L pronator , Tone - MAS 3/4 in L pec, L biceps, no L ankle clonus   Assessment/Plan: 1. Functional deficits which require 3+ hours per day of interdisciplinary therapy in a comprehensive inpatient rehab setting.  Physiatrist is providing close team supervision and 24 hour management of active medical problems listed below.  Physiatrist and rehab team continue to assess barriers to discharge/monitor patient progress toward functional and medical goals  Care Tool:  Bathing    Body parts bathed by patient: Left arm,Chest,Abdomen,Right upper leg,Front perineal area,Left upper leg,Right lower leg,Face   Body parts  bathed by helper: Buttocks,Left lower leg,Right arm     Bathing assist Assist Level: Moderate Assistance - Patient 50 - 74%     Upper Body Dressing/Undressing Upper body dressing   What is the patient wearing?: Pull over shirt    Upper body assist Assist Level: Moderate Assistance - Patient 50 - 74%    Lower Body Dressing/Undressing Lower body dressing      What is the patient wearing?: Pants,Incontinence brief     Lower body assist Assist for lower body dressing: Maximal Assistance - Patient 25 - 49%     Toileting Toileting    Toileting assist Assist for toileting: Maximal Assistance - Patient 25 - 49%     Transfers Chair/bed transfer  Transfers assist     Chair/bed transfer assist level: Moderate Assistance - Patient 50 - 74% (squat pivot)     Locomotion Ambulation   Ambulation assist   Ambulation activity did not occur: Safety/medical concerns  Assist level: Total Assistance - Patient < 25% Assistive device: Lite Gait Max distance: 114ft   Walk 10 feet activity   Assist  Walk 10 feet activity did not occur: Safety/medical concerns  Assist level: 2 helpers (+39max A via 3 Musketeer) Assistive device: Other (comment) (3  Musketeer)   Walk 50 feet activity   Assist Walk 50 feet with 2 turns activity did not occur: Safety/medical concerns         Walk 150 feet activity   Assist Walk 150 feet activity did not occur: Safety/medical concerns         Walk 10 feet on uneven surface  activity   Assist Walk 10 feet on uneven surfaces activity did not occur: Safety/medical concerns         Wheelchair     Assist Will patient use wheelchair at discharge?: Yes (Yes per PT long term goals) Type of Wheelchair: Manual Wheelchair activity did not occur: Safety/medical concerns         Wheelchair 50 feet with 2 turns activity    Assist            Wheelchair 150 feet activity     Assist          Blood pressure  119/71, pulse 70, temperature (!) 97.3 F (36.3 C), temperature source Oral, resp. rate 14, height 6' (1.829 m), weight 117.6 kg, SpO2 100 %.   Medical Problem List and Plan: 1.  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  Continue Pradaxa.              -antiplatelet therapy: Aspirin 81 mg daily 3. Pain Management: Continue Tylenol as needed 4. Mood: Provide emotional support             -antipsychotic agents: N/A 5. Neuropsych: This patient is capable of making decisions on his own behalf. 6. Skin/Wound Care: Routine skin checks 7. Fluids/Electrolytes/Nutrition: Routine in and outs 8.  Seizure disorder.  Continue Keppra 500 mg twice daily  No breakthrough seizures 9.  Hypertension.    Lisinopril 40 mg daily, decreased to 20 on 3/28  Hydralazine 75 mg every 8 hours  Norvasc 10 mg daily.   Vitals:   02/27/21 2035 02/28/21 0431  BP: 132/76 119/71  Pulse: 66 70  Resp: 16 14  Temp: 98.1 F (36.7 C) (!) 97.3 F (36.3 C)  SpO2: 100% 100%   Relatively soft on 3/30, reduce hydralazine to 50mg , monitor , normotensive today   10.  Hypothyroidism.  Synthroid 11.  CKD stage III.  Baseline creatinine 1.83.  Creatinine 1.72 on 3/21, labs ordered for today  12.  History of tobacco abuse.  Provide counseling 13.  Hyperlipidemia: Lipitor 14. 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  UA unremarkable, urine culture negative   15. Seasonal allergies: continue Flonase.  16.  Post stroke spasticity: increase Baclofen to 10mg  TID- continue as tolerating dose. Outpatient Botox.  17.  Post stroke dysphagia  D3 thins, advance diet as tolerated  LOS: 16 days A FACE TO FACE EVALUATION WAS PERFORMED  Charlett Blake 02/28/2021, 8:40 AM

## 2021-02-28 NOTE — Progress Notes (Signed)
Occupational Therapy Weekly Progress Note  Patient Details  Name: Matthew Wise MRN: [id number] Date of Birth: 03-26-1961  Beginning of progress report period: February 21, 2021 End of progress report period: February 28, 2021  Today's Date: 02/28/2021 OT Individual Time: 7106-2694 OT Individual Time Calculation (min): 31 min    Patient has met 2 of 3 short term goals.  Matthew Wise is making steady but slow progress with OT at this time.  Currently, he is able to complete UB bathing with min assist and and UB dressing at mod assist for donning a pullover.  He needs max assist for LB bathing and dressing sit to stand with mod assist for squat pivot transfers to the 3:1 or drop arm and mod to max for stand pivot.  He continues to demonstrate some increased pushing to the left side in standing with motor impersistence noted in the LLE while standing.  LUE function is currently Brunnstrum stage II as well with trace shoulder movement noted and increased internal rotation and elbow flexor tone prevalent.  Right inattention is still present along with right visual field cut.  Feel he will not meet some established LTGs set at min assist and will likely need mod assist for tasks such as LB dressing and some transfers.  Will downgrade accordingly.  Planned discharge on 4/13 with 24 hour supervision/ assist from his sister's.  Unsure if they will be able to provide assist at pt's current functional level.      Patient continues to demonstrate the following deficits: muscle weakness and muscle paralysis, impaired timing and sequencing, unbalanced muscle activation, decreased coordination and decreased motor planning, field cut, decreased attention to left and left side neglect, decreased initiation, decreased attention, decreased awareness, decreased problem solving, decreased safety awareness, decreased memory and delayed processing and decreased sitting balance, decreased standing balance, decreased postural  control, hemiplegia and decreased balance strategies and therefore will continue to benefit from skilled OT intervention to enhance overall performance with BADL and Reduce care partner burden.  Patient not progressing toward long term goals.  See goal revision..  Continue plan of care.  OT Short Term Goals Week 3:  OT Short Term Goal 1 (Week 3): Pt will donn a pullover shirt with min assist and mod demonstrational cueing for hemi dressing technique. OT Short Term Goal 2 (Week 3): Pt will complete toilet transfer with mod assist stand pivot to both directions. OT Short Term Goal 3 (Week 3): Pt will complete LB bathing with mod assist sit to stand.  Skilled Therapeutic Interventions/Progress Updates:    Pt in wheelchair to start session.  Had him work on toilet transfers stand pivot to the regular height toilet with the rail.  Mod assist for stand pivot transfer to the toilet from the tilt in space wheelchair.  He was able to complete clothing management prior to toileting with mod assist sit to stand.  Max assist was needed for toilet hygiene sit to stand as well as for management of the brief.  He needed max assist for stand pivot transfer back to the left to the wheelchair as he would not let go of the grab bar with his RUE.  Had him stand at the sink for washing his hands with mod assist overall.  Noted increased motor persistence in standing with the left knee bending into flexion as well as his trunk, requiring mod instructional cueing to fix.  He was left in the tilt in space wheelchair at end of session with  call button and phone in reach and PT in to start next session.    Therapy Documentation Precautions:  Precautions Precautions: Fall,Other (comment) Precaution Comments: L hemiplegia, left neglect, hx of seizures, age indeterminate B LE DVTs Restrictions Weight Bearing Restrictions: No  Pain: Pain Assessment Pain Scale: Faces Pain Score: 0-No pain ADL: See Care Tool Section for  some details of mobility and selfcare  Therapy/Group: Individual Therapy  Melissaann Dizdarevic,[place] OTR/L 02/28/2021, 4:14 PM

## 2021-02-28 NOTE — Plan of Care (Signed)
  Problem: RH Dressing Goal: LTG Patient will perform lower body dressing w/assist (OT) Description: LTG: Patient will perform lower body dressing with assist, with/without cues in positioning using equipment (OT) Flowsheets (Taken 02/28/2021 1636) LTG: Pt will perform lower body dressing with assistance level of: (Goal downgraded based on slower progress) Moderate Assistance - Patient 50 - 74% Note: Goal downgraded based on slower progress   Problem: RH Functional Use of Upper Extremity Goal: LTG Patient will use RT/LT upper extremity as a (OT) Description: LTG: Patient will use right/left upper extremity as a stabilizer/gross assist/diminished/nondominant/dominant level with assist, with/without cues during functional activity (OT) Flowsheets (Taken 02/28/2021 1636) LTG: Use of upper extremity in functional activities: (Goal downgraded based on slower progress) LUE as a stabilizer LTG: Pt will use upper extremity in functional activity with assistance level of: Moderate Assistance - Patient 50 - 74% Note: Goal downgraded based on slower progress   Problem: RH Tub/Shower Transfers Goal: LTG Patient will perform tub/shower transfers w/assist (OT) Description: LTG: Patient will perform tub/shower transfers with assist, with/without cues using equipment (OT) Flowsheets (Taken 02/28/2021 1636) LTG: Pt will perform tub/shower stall transfers with assistance level of: (Goal downgraded based on slower progress) Moderate Assistance - Patient 50 - 74% LTG: Pt will perform tub/shower transfers from: Tub/shower combination Note: Goal downgraded based on slower progress

## 2021-02-28 NOTE — Progress Notes (Signed)
Speech Language Pathology Daily Session Note  Patient Details  Name: Matthew Wise MRN: [id number] Date of Birth: [birth year]  Today's Date: 02/28/2021 SLP Individual Time: 0947-0962 SLP Individual Time Calculation (min): 55 min  Short Term Goals: Week 3: SLP Short Term Goal 1 (Week 3): Pt will demonsrate efficient mastication and oral clearance of regular textured solids Mod I for use of compensatory swallow strategies (for clearance of buccal pocketing). SLP Short Term Goal 2 (Week 3): Pt will demonstrate ability to problem solve mildly complex to complex tasks with Supervision A verbal/visual cuess. SLP Short Term Goal 3 (Week 3): Pt will recall new and/or daily information with Supervisoin A cues. SLP Short Term Goal 4 (Week 3): Pt will attend to left side of his body and/or locate items to left of midline with Supervision A cueing. SLP Short Term Goal 5 (Week 3): Pt will detect functional errors with Min A cues. SLP Short Term Goal 6 (Week 3): Pt will alternate his between between functional tasks with Supervision A cues for redirection.  Skilled Therapeutic Interventions: Pt was seen for skilled ST targeting dysphagia and cognitive goals. SLP facilitated session with a regular texture snack to assess tolerance of recent upgrade. He was more fatigued this AM, which suspect influenced him needing increased Supervision A verbal cues for correcting left anterior loss of solids. He cleared min left buccal pocketing more independently (Mod I) with primarily use of liquid washes. Recommend continue current diet with intermittent supervision in addition to set up assist. SLP further facilitated session by repeating a medication management task. His independent recall of medication functions was slightly increased today (6/9 recalled without cues) and only Supervision A verbal cues required for recall of remaining 3. When organizing a TID pill box according to his list of medications, he appeared more  disorganized and required more consistent Mod A verbal and visual cueing for error awareness, problem solving, and visual scanning left to right in comparison to when this task was previously targeted, perhaps due to level of fatigue. Two different structured organizational and problem solving strategies attempted to see if it increased his accuracy, however it did not. Pt left sitting in tilt in space chair with alarm set and needs within reach. Continue per current plan of care.         Pain Pain Assessment Pain Scale: 0-10 Pain Score: 0-No pain  Therapy/Group: Individual Therapy  Arbutus Leas 02/28/2021, 9:21 AM

## 2021-02-28 NOTE — Progress Notes (Signed)
Physical Therapy Session Note  Patient Details  Name: Matthew Wise MRN: [id number] Date of Birth: 31-Jul-1961  Today's Date: 02/28/2021 PT Individual Time: 6967-8938 PT Individual Time Calculation (min): 54 min   Short Term Goals: Week 2:  PT Short Term Goal 1 (Week 2): Pt will perform sit to stand with mod assist. PT Short Term Goal 2 (Week 2): Pt will perform bed to chair transfers with mod assist. PT Short Term Goal 3 (Week 2): Pt will perform gait with LRAD for 40 ft +2 mod assist. PT Short Term Goal 4 (Week 2): Pt will perform supine to sit and sit to supine with mod assist.  Skilled Therapeutic Interventions/Progress Updates:   Pt received sitting in WC and agreeable to PT. Pt transported to rehab gym in [facility]. PT obtained 20x18 hemi height WC. Squat pivot transfers to the R and L on and off mat table to transfer to [facility] with mod assist overall and moderate cues for proper LE placement to reduce fall risk. WC mobility x 19f +1623fwith min assist overall and moderate cues for sequencing of RUE/RLE to maintain straight path through hall and navigate turns and doorway. Pt noted to have [facility] the [facility] requiring assist to prevent hitting wall. Sit<>stand facing parallel bars x 4 with mod assist to improve midline orientation. Lateral weight shift R and L with min assist and max cues to attend to the RLE as well as use visual cues of mirror. Stepping on eadge of parallel bars 2 x 5 BLE with reciprocal pattern on the second bout. Mod-max assist from PT to improved terminal knee extension on the LLE to prevent lateral lean to the R and reduce fall risk. Pt returned to room and performed squat pivot transfer to bed with mod assist.  Sit>supine completed with min assist at the LLE, and left supine in bed with call bell in reach and all needs met.         Therapy Documentation Precautions:  Precautions Precautions: Fall,Other (comment) Precaution Comments: L hemiplegia,  left neglect, hx of seizures, age indeterminate B LE DVTs Restrictions Weight Bearing Restrictions: No    Vital Signs: Therapy Vitals Temp: 98.6 F (37 C) Temp Source: Oral Pulse Rate: (!) 56 Resp: 14 BP: 104/67 Patient Position (if appropriate): Sitting Oxygen Therapy SpO2: 100 % O2 Device: Room Air Pain: Pain Assessment Pain Scale: Faces Pain Score: 0-No pain    Therapy/Group: Individual Therapy  AuLorie Phenix/11/2020, 4:34 PM

## 2021-02-28 NOTE — Progress Notes (Signed)
Occupational Therapy Session Note  Patient Details  Name: Matthew Wise MRN: [id number] Date of Birth: [birth year]  Today's Date: 02/28/2021 OT Individual Time: 1003-1030 OT Individual Time Calculation (min): 27 min    Short Term Goals: Week 2:  OT Short Term Goal 1 (Week 2): Pt will donn a pullover shirt with min assist and mod demonstrational cueing for hemi dressing technique. OT Short Term Goal 2 (Week 2): Pt will complete squat pivot toilet transfer to drop arm commode with mod assist. OT Short Term Goal 3 (Week 2): Pt will donn pants with max assist sit to stand with use of AE PRN.  Skilled Therapeutic Interventions/Progress Updates:    Pt greeted semi-reclined in bed and had just used urinal. Pt able to wash peri-area with set-up A in bed. Rolling L with min A, then max A to roll R 2/2 L hemiplegia. Total A to wash buttocks and change brief. OT assist to thread pants, then worked on hip bridging to pull pants up while OT supported L LE. Max A to come to sitting EOB, then pt completed mod A squat-pivot from bed to TIS wc. OT handoff to SLP for next therapy session.    Therapy Documentation Precautions:  Precautions Precautions: Fall,Other (comment) Precaution Comments: L hemiplegia, left neglect, hx of seizures, age indeterminate B LE DVTs Restrictions Weight Bearing Restrictions: No Pain: Pain Assessment Pain Scale: 0-10 Pain Score: 0-No pain   Therapy/Group: Individual Therapy  Valma Cava 02/28/2021, 10:33 AM

## 2021-03-01 DIAGNOSIS — N39498 Other specified urinary incontinence: Secondary | ICD-10-CM

## 2021-03-01 MED ORDER — BISACODYL 10 MG RE SUPP
10.0000 mg | Freq: Once | RECTAL | Status: AC
  Administered 2021-03-01: 10 mg via RECTAL
  Filled 2021-03-01: qty 1

## 2021-03-01 NOTE — Progress Notes (Signed)
Physical Therapy Session Note  Patient Details  Name: Matthew Wise MRN: [id number] Date of Birth: 10/21/1961   Upon entrance into room (0800), RN at the bedside assisting patient with his breakfast. Patient requesting to eat breakfast prior to PT. Missed 30 minutes of skilled therapy.  Therapy Documentation Precautions:  Precautions Precautions: Fall,Other (comment) Precaution Comments: L hemiplegia, left neglect, hx of seizures, age indeterminate B LE DVTs Restrictions Weight Bearing Restrictions: No General: PT Amount of Missed Time (min): 30 Minutes PT Missed Treatment Reason: Unavailable (Comment);Other (Comment) (Eating breakfast)  Therapy/Group: Individual Therapy  Matthew Wise P Johnni Wunschel PT 03/01/2021, 7:28 AM

## 2021-03-01 NOTE — Progress Notes (Signed)
PROGRESS NOTE   Subjective/Complaints:  Patient reports no issues; he says he feels like he needs to have a bowel movement, but is unable to use on the bedpan currently.  He does not remember when he had his last bowel movement, but needs to go.  Is willing to use a suppository.  ROS:  Pt denies SOB, abd pain, CP, N/V/C/D, and vision changes   Objective:   No results found. No results for input(s): WBC, HGB, HCT, PLT in the last 72 hours. No results for input(s): NA, K, CL, CO2, GLUCOSE, BUN, CREATININE, CALCIUM in the last 72 hours.  Intake/Output Summary (Last 24 hours) at 03/01/2021 1917 Last data filed at 03/01/2021 1844 Gross per 24 hour  Intake 2130 ml  Output 450 ml  Net 1680 ml        Physical Exam: Vital Signs Blood pressure (!) 108/59, pulse 67, temperature 97.8 F (36.6 C), resp. rate 16, height 6' (1.829 m), weight 117.6 kg, SpO2 100 %.    General: awake, alert, appropriate, sitting up in bed;  NAD HENT: conjugate gaze; oropharynx moist CV: regular rate; no JVD Pulmonary: CTA B/L; no W/R/R- good air movement GI: soft, NT, ND, (+)BS Psychiatric: appropriate; quiet, less interaction Neurological: Ox3 Skin: intact, dry Sensation absent LT on LUE and LLE  Motor:  RUE/RLE: 5/5 proximal to distal LUE/LLE: 3-/5 Left hip /knee ext synergy , 2- L pronator , Tone - MAS 3/4 in L pec, L biceps, no L ankle clonus   Assessment/Plan: 1. Functional deficits which require 3+ hours per day of interdisciplinary therapy in a comprehensive inpatient rehab setting.  Physiatrist is providing close team supervision and 24 hour management of active medical problems listed below.  Physiatrist and rehab team continue to assess barriers to discharge/monitor patient progress toward functional and medical goals  Care Tool:  Bathing    Body parts bathed by patient: Left arm,Chest,Abdomen,Right upper leg,Front perineal  area,Left upper leg,Right lower leg,Face   Body parts bathed by helper: Buttocks,Left lower leg,Right arm     Bathing assist Assist Level: Moderate Assistance - Patient 50 - 74%     Upper Body Dressing/Undressing Upper body dressing   What is the patient wearing?: Pull over shirt    Upper body assist Assist Level: Moderate Assistance - Patient 50 - 74%    Lower Body Dressing/Undressing Lower body dressing      What is the patient wearing?: Pants,Incontinence brief     Lower body assist Assist for lower body dressing: Maximal Assistance - Patient 25 - 49%     Toileting Toileting    Toileting assist Assist for toileting: Moderate Assistance - Patient 50 - 74%     Transfers Chair/bed transfer  Transfers assist     Chair/bed transfer assist level: Moderate Assistance - Patient 50 - 74% (squat pivot)     Locomotion Ambulation   Ambulation assist   Ambulation activity did not occur: Safety/medical concerns  Assist level: Total Assistance - Patient < 25% Assistive device: Lite Gait Max distance: 166ft   Walk 10 feet activity   Assist  Walk 10 feet activity did not occur: Safety/medical concerns  Assist level: 2 helpers (+  8max A via 3 Musketeer) Assistive device: Other (comment) (3 Musketeer)   Walk 50 feet activity   Assist Walk 50 feet with 2 turns activity did not occur: Safety/medical concerns         Walk 150 feet activity   Assist Walk 150 feet activity did not occur: Safety/medical concerns         Walk 10 feet on uneven surface  activity   Assist Walk 10 feet on uneven surfaces activity did not occur: Safety/medical concerns         Wheelchair     Assist Will patient use wheelchair at discharge?: Yes (Yes per PT long term goals) Type of Wheelchair: Manual Wheelchair activity did not occur: Safety/medical concerns         Wheelchair 50 feet with 2 turns activity    Assist            Wheelchair 150 feet  activity     Assist          Blood pressure (!) 108/59, pulse 67, temperature 97.8 F (36.6 C), resp. rate 16, height 6' (1.829 m), weight 117.6 kg, SpO2 100 %.   Medical Problem List and Plan: 1.  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  Continue Pradaxa.              -antiplatelet therapy: Aspirin 81 mg daily 3. Pain Management: Continue Tylenol as needed  4/2-patient denies pain; continue regimen 4. Mood: Provide emotional support             -antipsychotic agents: N/A 5. Neuropsych: This patient is capable of making decisions on his own behalf. 6. Skin/Wound Care: Routine skin checks 7. Fluids/Electrolytes/Nutrition: Routine in and outs 8.  Seizure disorder.  Continue Keppra 500 mg twice daily  No breakthrough seizures 9.  Hypertension.    Lisinopril 40 mg daily, decreased to 20 on 3/28  Hydralazine 75 mg every 8 hours  Norvasc 10 mg daily.   Vitals:   03/01/21 0752 03/01/21 1409  BP: (!) 100/54 (!) 108/59  Pulse: (!) 57 67  Resp:  16  Temp:  97.8 F (36.6 C)  SpO2:  100%   Relatively soft on 3/30, reduce hydralazine to 50mg , monitor , normotensive today   4/2-blood pressure is relatively soft-100s/50s; if still this way tomorrow might decrease his lisinopril  10.  Hypothyroidism.  Synthroid 11.  CKD stage III.  Baseline creatinine 1.83.  Creatinine 1.72 on 3/21, labs ordered for today  12.  History of tobacco abuse.  Provide counseling 13.  Hyperlipidemia: Lipitor 14. Urinary frequency/incontinence: May be  spastic bladder post CVA , pt now usually continent  4/2-Per nurse having more problems with urinary incontinence-likely spastic bladder  Started flomax HS.  UA unremarkable, urine culture negative   15. Seasonal allergies: continue Flonase.  16. Post stroke spasticity: increase Baclofen to 10mg  TID- continue as tolerating dose. Outpatient Botox.  17.  Post stroke dysphagia  D3 thins, advance diet as tolerated 18. Constipation  4/2-ordered a suppository  for patient to receive this afternoon, to help him empty.  LOS: 17 days A FACE TO FACE EVALUATION WAS PERFORMED  Tyrhonda Georgiades 03/01/2021, 7:17 PM

## 2021-03-01 NOTE — Progress Notes (Signed)
Continent and incontinent of urine. Needs assistance placing and holding urinal. 2 assist when incontinent. Left WHO applied at HS. Left PRAFO applied, but difficult to keep foot in boot, even with kick stand out and propped on pillow. Denies pain. BLE edema. [name]

## 2021-03-01 NOTE — Progress Notes (Signed)
Physical Therapy Weekly Progress Note  Patient Details  Name: Matthew Wise MRN: [id number] Date of Birth: Apr 28, 1961   Beginning of progress report period: February 20, 2021 End of progress report period: March 01, 2021  Today's Date: 03/01/2021 PT Individual Time: 7035-0093 PT Individual Time Calculation (min): 24 min   Patient has met 3 of 4 short term goals. Pt making slow but steady progress towards goals. Unable to reach ambulation goal of 52f to this date, per chart review. He currently requires modA overall for supine<>sit but levels fluctuate depending on fatigue. Sitting balance is fair with ability to sit unsupported but with any dynamic functional movement in sitting, he needs min/modA for balance due to weak core and L hemibody weakness. He is able to perform stand<>pivot transfers with modA, but needs cues for initiation and technique. He has ambulated up to 197fvia 3-muskateer technique with +2 maxA and has ambulated on Treadmill with LiteGait up to 16029fHe continues to be primarily limited by L hemibody weakness, L inattention, decreased awareness, impaired problem solving, and poor motor planning.   Patient continues to demonstrate the following deficits muscle weakness and muscle joint tightness, decreased cardiorespiratoy endurance, impaired timing and sequencing, abnormal tone, unbalanced muscle activation, motor apraxia, decreased coordination and decreased motor planning, decreased midline orientation, decreased attention to left, decreased motor planning and ideational apraxia and decreased sitting balance, decreased standing balance, decreased postural control, hemiplegia and decreased balance strategies and therefore will continue to benefit from skilled PT intervention to increase functional independence with mobility.  Patient progressing toward long term goals.  Continue plan of care. Monitoring need to downgrade goals as appropriate.  PT Short Term Goals Week 3:  PT  Short Term Goal 1 (Week 3): Pt will complete bed mobility consistently with modA and no hospital bed features PT Short Term Goal 2 (Week 3): Pt will complete bed<>chair transfers with minA and LRAD PT Short Term Goal 3 (Week 3): Pt will ambulate 39f64fth modA and LRAD PT Short Term Goal 4 (Week 3): Pt will propel himself in w/c 150ft2fh minA on level surfaces  Skilled Therapeutic Interventions/Progress Updates:   Pt received supine in bed, RN at bedside as well preparing to give suppository - agreeable to hold until PT session complete but requesting pt return to bed afterwards. Pt reports no pain. Flat affect throughout session. Donned scrub pants with maxA while supine in bed, encouraging pt to participate with RUE. Supine<>sit with heavy modA with HOB elevated ~40deg, cues for reaching with RUE to foot of bed to encourage R lateral lean and assist with trunk to upright management. Sit's EOB with SBA while unsupported. Sit<>stand with modA and L knee block, requiring maxA for pulling pants over his hips - again, needing cues to engage his RUE to assist with this. Stand<>pivot transfer with modA, needing cues for hand placement and sequencing. Focused remainder of session on hemi technique with w/c mobility - pt proplled himself ~39ft 44f5ft +10fft wi63fostly minA while using his R hemibody to manage propulsion. Note that w/c tends to veer R - therefore, question his true ability to maintain straight path propulsion if w/c did not veer. Returned to his room with totalA iRussellfor time management. Stand<>pivot transfer with heavy modA back to his bed. Completed sit>supine required modA for LLE management and trunk control. Lowered HOB flat and pt able to boost himself up in bed with RUE to head of bed, minA. Pt requesting to  use urinal - provided him with urinal but pt requesting ++ time to void. He remained semi-reclined in bed with needs in reach, bed alarm on. RN made aware of pt's status.  Therapy  Documentation Precautions:  Precautions Precautions: Fall,Other (comment) Precaution Comments: L hemiplegia, left neglect, hx of seizures, age indeterminate B LE DVTs Restrictions Weight Bearing Restrictions: No  Therapy/Group: Individual Therapy  Udell Blasingame P Latrelle Bazar PT 03/01/2021, 7:31 AM

## 2021-03-02 NOTE — Progress Notes (Signed)
Speech Language Pathology Daily Session Note  Patient Details  Name: Matthew Wise MRN: [id number] Date of Birth: [birth year]  Today's Date: 03/02/2021 SLP Individual Time: 1001-1031 SLP Individual Time Calculation (min): 30 min  Short Term Goals: Week 3: SLP Short Term Goal 1 (Week 3): Pt will demonsrate efficient mastication and oral clearance of regular textured solids Mod I for use of compensatory swallow strategies (for clearance of buccal pocketing). SLP Short Term Goal 2 (Week 3): Pt will demonstrate ability to problem solve mildly complex to complex tasks with Supervision A verbal/visual cuess. SLP Short Term Goal 3 (Week 3): Pt will recall new and/or daily information with Supervisoin A cues. SLP Short Term Goal 4 (Week 3): Pt will attend to left side of his body and/or locate items to left of midline with Supervision A cueing. SLP Short Term Goal 5 (Week 3): Pt will detect functional errors with Min A cues. SLP Short Term Goal 6 (Week 3): Pt will alternate his between between functional tasks with Supervision A cues for redirection.  Skilled Therapeutic Interventions: Pt seen for skilled ST with focus on cognitive goals. SLP facilitating short term memory task for novel information by providing mod fading to min A verbal cues to increase accuracy to 60%. Pt educated on role of attention and impact on encoding phase of memory, pt endorses some difficulty with sustained/focused attention post CVA. RN present and SLP assisting with brief change. Pt benefits from Supervision level verbal cues to sequencing/assist with brief change (rolling, bridging hips, etc). Pt left in bed with all needs within reach and alarm set. Cont ST POC.   Pain Pain Assessment Pain Scale: 0-10 Pain Score: 0-No pain  Therapy/Group: Individual Therapy  Dewaine Conger 03/02/2021, 11:34 AM

## 2021-03-03 LAB — BASIC METABOLIC PANEL
Anion gap: 6 (ref 5–15)
BUN: 17 mg/dL (ref 6–20)
CO2: 30 mmol/L (ref 22–32)
Calcium: 10 mg/dL (ref 8.9–10.3)
Chloride: 104 mmol/L (ref 98–111)
Creatinine, Ser: 1.65 mg/dL — ABNORMAL HIGH (ref 0.61–1.24)
GFR, Estimated: 48 mL/min — ABNORMAL LOW (ref >60)
Glucose, Bld: 94 mg/dL (ref 70–99)
Potassium: 3.6 mmol/L (ref 3.5–5.1)
Sodium: 140 mmol/L (ref 135–145)

## 2021-03-03 LAB — CBC
HCT: 36.9 % — ABNORMAL LOW (ref 39.0–52.0)
Hemoglobin: 12.5 g/dL — ABNORMAL LOW (ref 13.0–17.0)
MCH: 28.8 pg (ref 26.0–34.0)
MCHC: 33.9 g/dL (ref 30.0–36.0)
MCV: 85 fL (ref 80.0–100.0)
Platelets: 179 10*3/uL (ref 150–400)
RBC: 4.34 MIL/uL (ref 4.22–5.81)
RDW: 13 % (ref 11.5–15.5)
WBC: 4 10*3/uL (ref 4.0–10.5)
nRBC: 0 % (ref 0.0–0.2)

## 2021-03-03 NOTE — Progress Notes (Signed)
Physical Therapy Session Note  Patient Details  Name: Matthew Wise MRN: [id number] Date of Birth: June 27, 1961  Today's Date: 03/03/2021 PT Individual Time: 4680-3212 PT Individual Time Calculation (min): 42 min   Short Term Goals: Week 3:  PT Short Term Goal 1 (Week 3): Pt will complete bed mobility consistently with modA and no hospital bed features PT Short Term Goal 2 (Week 3): Pt will complete bed<>chair transfers with minA and LRAD PT Short Term Goal 3 (Week 3): Pt will ambulate 63ft with modA and LRAD PT Short Term Goal 4 (Week 3): Pt will propel himself in w/c 117ft with minA on level surfaces  Skilled Therapeutic Interventions/Progress Updates:    Patient received sitting up in wc, agreeable to PT. He denies pain. PT transporting patient in wc to therapy gym for time management and energy conservation. Standing NMR task completed with ModA/MaxA to remain standing, mirror for visual feedback and reaching R to place clips onto higher string to encourage full trunk extension. Patient able to maintain trunk midline with Min verbal cuing, which is a good improvement from previous therapy sessions. Patient does fatigue very easily and requires prolonged seated rest breaks after minimal activity. Patient ambulated 65ft, 69ft with MaxA + ModA + mirror + wc follow. He was able to advance L LE ~25% of the way, but maintained strong ER despite PT attempt to correct. Blocked practice lateral scoot transfers vs squat pivot transfers.He required MinA for lateral scoot to relatively level surface. He reports that he's able to adjust bed height (likely be removing box spring) to be level to wc. He required ModA for squat pivot transfers to wc, but was able to attend to this task and assist PT with transfer more consistently than previously. Patient returning to room in wc, MaxA squat pivot to bed, ModA to return supine. Bed alarm on, call light within reach.   Therapy Documentation Precautions:   Precautions Precautions: Fall,Other (comment) Precaution Comments: L hemiplegia, left neglect, hx of seizures, age indeterminate B LE DVTs Restrictions Weight Bearing Restrictions: No    Therapy/Group: Individual Therapy  Karoline Caldwell, PT, DPT, CBIS  03/03/2021, 7:48 AM

## 2021-03-03 NOTE — Progress Notes (Signed)
Occupational Therapy Session Note  Patient Details  Name: Matthew Wise MRN: [id number] Date of Birth: 06-05-1961  Today's Date: 03/03/2021 OT Individual Time: 2993-7169 OT Individual Time Calculation (min): 60 min    Short Term Goals: Week 3:  OT Short Term Goal 1 (Week 3): Pt will donn a pullover shirt with min assist and mod demonstrational cueing for hemi dressing technique. OT Short Term Goal 2 (Week 3): Pt will complete toilet transfer with mod assist stand pivot to both directions. OT Short Term Goal 3 (Week 3): Pt will complete LB bathing with mod assist sit to stand.  Skilled Therapeutic Interventions/Progress Updates:    Pt in bed to start session, agreeable to completion of shower and dressing tasks.  Supine to sit with mod assist for initiation and completion.  Charlaine Dalton was utilized for transfer to the shower secondary to decreased time, with mod assist for sit to stand.  He removed pullover shirt with supervision and then doffed brief with mod assist.  Bathing was completed with overall mod assist.  Max instructional cueing with occasional min assist for correcting lean to the left as well as for initiation of bathing as he would just sit and rinse water over his right side.  Max hand over hand assist for integration of the LUE into session for washing the RUE or holding the soap when opened.  Mod assist for sit to stand when washing peri area with increased pushing to the left noted.  He transferred stand pivot to the right with max assist to the wheelchair for dressing tasks.  Min assist for pullover shirt with mod assist for orientation of the shirt and for starting the LUE first.  He also needed max assist for donning his brief and pants.  Total assist was needed for donning knee high TEDs and gripper socks secondary to time.  He was left sitting in his wheelchair with the call button and phone in reach with safety alarm in place.  LUE supported on half lap tray.    Therapy  Documentation Precautions:  Precautions Precautions: Fall,Other (comment) Precaution Comments: L hemiplegia, left neglect, hx of seizures, age indeterminate B LE DVTs Restrictions Weight Bearing Restrictions: No   Pain: Pain Assessment Pain Scale: Faces Pain Score: 0-No pain ADL: See Care Tool Section for some details of mobility and selfcare  Therapy/Group: Individual Therapy  Chancellor Vanderloop,Seferino OTR/L 03/03/2021, 12:36 PM

## 2021-03-03 NOTE — Progress Notes (Signed)
Speech Language Pathology Daily Session Note  Patient Details  Name: Matthew Wise MRN: [id number] Date of Birth: November 13, 1961  Today's Date: 03/03/2021 SLP Individual Time: 1032-1127 SLP Individual Time Calculation (min): 55 min  Short Term Goals: Week 3: SLP Short Term Goal 1 (Week 3): Pt will demonsrate efficient mastication and oral clearance of regular textured solids Mod I for use of compensatory swallow strategies (for clearance of buccal pocketing). SLP Short Term Goal 2 (Week 3): Pt will demonstrate ability to problem solve mildly complex to complex tasks with Supervision Wise verbal/visual cuess. SLP Short Term Goal 3 (Week 3): Pt will recall new and/or daily information with Supervisoin Wise cues. SLP Short Term Goal 4 (Week 3): Pt will attend to left side of his body and/or locate items to left of midline with Supervision Wise cueing. SLP Short Term Goal 5 (Week 3): Pt will detect functional errors with Min Wise cues. SLP Short Term Goal 6 (Week 3): Pt will alternate his between between functional tasks with Supervision Wise cues for redirection.  Skilled Therapeutic Interventions: Skilled SLP intervention focused on cognition and dysphagia. Pt seen with regular snack (sugar cookie). He took small bites and small sips between each bite to clear any possible residue. He tolerated with no overt s/sx of aspiration or penetration. Pt answered questions regarding medications with min Wise verbal cues and using written medical list. Semi compex problem solving task with pt reading bus schedule and determining time elapsed between stops completed with moderate assistance. Pt appeared lethergic as session continued and needed questions repeated. He denied feeling tired. Pt left seated upright in wheelchair with call button within reach and chair alarm set. Cont with therapy per plan of care.      Pain Pain Assessment Pain Scale: 0-10 Pain Score: 0-No pain  Therapy/Group: Individual Therapy  Matthew Wise  Matthew Wise 03/03/2021, 11:22 AM

## 2021-03-03 NOTE — Progress Notes (Signed)
PROGRESS NOTE   Subjective/Complaints:  No issues overnight   ROS:  Pt denies SOB, abd pain, CP, N/V/C/D, and vision changes   Objective:   No results found. Recent Labs    03/03/21 0752  WBC 4.0  HGB 12.5*  HCT 36.9*  PLT 179   Recent Labs    03/03/21 0752  NA 140  K 3.6  CL 104  CO2 30  GLUCOSE 94  BUN 17  CREATININE 1.65*  CALCIUM 10.0    Intake/Output Summary (Last 24 hours) at 03/03/2021 0844 Last data filed at 03/03/2021 0839 Gross per 24 hour  Intake 1080 ml  Output 850 ml  Net 230 ml        Physical Exam: Vital Signs Blood pressure 121/75, pulse 65, temperature 98.2 F (36.8 C), temperature source Oral, resp. rate 18, height 6' (1.829 m), weight 117.6 kg, SpO2 100 %.  General: No acute distress Mood and affect are appropriate Heart: Regular rate and rhythm no rubs murmurs or extra sounds Lungs: Clear to auscultation, breathing unlabored, no rales or wheezes Abdomen: Positive bowel sounds, soft nontender to palpation, nondistended Extremities: No clubbing, cyanosis, or edema Skin: No evidence of breakdown, no evidence of rash  Sensation absent LT on LUE and LLE  Motor:  RUE/RLE: 5/5 proximal to distal LUE/LLE: 3-/5 Left hip /knee ext synergy , 2- L pronator , Tone - MAS 3/4 in L pec, L biceps, no L ankle clonus   Assessment/Plan: 1. Functional deficits which require 3+ hours per day of interdisciplinary therapy in a comprehensive inpatient rehab setting.  Physiatrist is providing close team supervision and 24 hour management of active medical problems listed below.  Physiatrist and rehab team continue to assess barriers to discharge/monitor patient progress toward functional and medical goals  Care Tool:  Bathing    Body parts bathed by patient: Left arm,Chest,Abdomen,Right upper leg,Front perineal area,Left upper leg,Right lower leg,Face   Body parts bathed by helper: Buttocks,Left  lower leg,Right arm     Bathing assist Assist Level: Moderate Assistance - Patient 50 - 74%     Upper Body Dressing/Undressing Upper body dressing   What is the patient wearing?: Pull over shirt    Upper body assist Assist Level: Moderate Assistance - Patient 50 - 74%    Lower Body Dressing/Undressing Lower body dressing      What is the patient wearing?: Pants,Incontinence brief     Lower body assist Assist for lower body dressing: Maximal Assistance - Patient 25 - 49%     Toileting Toileting    Toileting assist Assist for toileting: Moderate Assistance - Patient 50 - 74%     Transfers Chair/bed transfer  Transfers assist     Chair/bed transfer assist level: Moderate Assistance - Patient 50 - 74% (squat pivot)     Locomotion Ambulation   Ambulation assist   Ambulation activity did not occur: Safety/medical concerns  Assist level: Total Assistance - Patient < 25% Assistive device: Lite Gait Max distance: 152ft   Walk 10 feet activity   Assist  Walk 10 feet activity did not occur: Safety/medical concerns  Assist level: 2 helpers (+43max A via 3 Musketeer) Assistive device: Other (  comment) (3 Musketeer)   Walk 50 feet activity   Assist Walk 50 feet with 2 turns activity did not occur: Safety/medical concerns         Walk 150 feet activity   Assist Walk 150 feet activity did not occur: Safety/medical concerns         Walk 10 feet on uneven surface  activity   Assist Walk 10 feet on uneven surfaces activity did not occur: Safety/medical concerns         Wheelchair     Assist Will patient use wheelchair at discharge?: Yes (Yes per PT long term goals) Type of Wheelchair: Manual Wheelchair activity did not occur: Safety/medical concerns         Wheelchair 50 feet with 2 turns activity    Assist            Wheelchair 150 feet activity     Assist          Blood pressure 121/75, pulse 65, temperature 98.2  F (36.8 C), temperature source Oral, resp. rate 18, height 6' (1.829 m), weight 117.6 kg, SpO2 100 %.   Medical Problem List and Plan: 1.  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  Continue Pradaxa.              -antiplatelet therapy: Aspirin 81 mg daily 3. Pain Management: Continue Tylenol as needed  4/2-patient denies pain; continue regimen 4. Mood: Provide emotional support             -antipsychotic agents: N/A 5. Neuropsych: This patient is capable of making decisions on his own behalf. 6. Skin/Wound Care: Routine skin checks 7. Fluids/Electrolytes/Nutrition: Routine in and outs 8.  Seizure disorder.  Continue Keppra 500 mg twice daily  No breakthrough seizures 9.  Hypertension.    Lisinopril 40 mg daily, decreased to 20 on 3/28  Hydralazine 75 mg every 8 hours  Norvasc 10 mg daily.   Vitals:   03/03/21 0348 03/03/21 0742  BP: 113/68 121/75  Pulse: (!) 59 65  Resp: 18   Temp: 98.2 F (36.8 C)   SpO2: 100%    Will reduce Lisinopril to 10mg    10.  Hypothyroidism.  Synthroid 11.  CKD stage III.  Baseline creatinine 1.83.  Creatinine 1.65 on 4/4 stable  12.  History of tobacco abuse.  Provide counseling 13.  Hyperlipidemia: Lipitor 14. Urinary frequency/incontinence: May be spastic bladder post CVA , pt now usually continent  4/2-Per nurse having more problems with urinary incontinence-likely spastic bladder  Started flomax HS.  UA unremarkable, urine culture negative   15. Seasonal allergies: continue Flonase.  16. Post stroke spasticity:  increase Baclofen to 10mg  TID- continue as tolerating dose. Outpatient Botox.  17.  Post stroke dysphagia  D3 thins, advance diet as tolerated 18. Constipation  4/2-ordered a suppository for patient to receive this afternoon, to help him empty.  LOS: 19 days A FACE TO [facility] Teria Khachatryan 03/03/2021, 8:44 AM

## 2021-03-03 NOTE — Plan of Care (Signed)
  Problem: RH BOWEL ELIMINATION Goal: RH STG MANAGE BOWEL WITH ASSISTANCE Description: STG Manage Bowel with mod I Assistance. Flowsheets (Taken 03/03/2021 0149) STG: Pt will manage bowels with assistance: 3-Moderate assistance Goal: RH STG MANAGE BOWEL W/MEDICATION W/ASSISTANCE Description: STG Manage Bowel with Medication with Mod I Assistance. Flowsheets (Taken 03/03/2021 0149) STG: Pt will manage bowels with medication with assistance: 3-Moderate assistance   Problem: RH BLADDER ELIMINATION Goal: RH STG MANAGE BLADDER WITH ASSISTANCE Description: STG Manage Bladder With Mod I Assistance Flowsheets (Taken 03/03/2021 0149) STG: Pt will manage bladder with assistance: 3-Moderate assistance   Problem: RH SAFETY Goal: RH STG ADHERE TO SAFETY PRECAUTIONS W/ASSISTANCE/DEVICE Description: STG Adhere to Safety Precautions With Mod I Assistance/Device. Flowsheets (Taken 03/03/2021 0149) STG:Pt will adhere to safety precautions with assistance/device: 4-Minimal assistance Goal: RH STG DECREASED RISK OF FALL WITH ASSISTANCE Description: STG Decreased Risk of Fall With cues/supervision Assistance. Flowsheets (Taken 03/03/2021 0149) [id number] risk of fall  with assistance/device: 3-Moderate assistance

## 2021-03-04 ENCOUNTER — Telehealth (INDEPENDENT_AMBULATORY_CARE_PROVIDER_SITE_OTHER): Payer: Self-pay

## 2021-03-04 NOTE — Progress Notes (Signed)
Physical Therapy Session Note  Patient Details  Name: Matthew Wise MRN: [id number] Date of Birth: 09/29/1961  Today's Date: 03/04/2021 PT Individual Time: 2694-8546 PT Individual Time Calculation (min): 61 min   Short Term Goals: Week 3:  PT Short Term Goal 1 (Week 3): Pt will complete bed mobility consistently with modA and no hospital bed features PT Short Term Goal 2 (Week 3): Pt will complete bed<>chair transfers with minA and LRAD PT Short Term Goal 3 (Week 3): Pt will ambulate 74ft with modA and LRAD PT Short Term Goal 4 (Week 3): Pt will propel himself in w/c 145ft with minA on level surfaces  Skilled Therapeutic Interventions/Progress Updates:    Pt received asleep, supine in bed and upon awakening agreeable to therapy session. Pt's brief noted to be coming off and soiled performed hygiene and LB clothing at bed level - rolling L using bedrail with supervision and verbal cuing for technique and rolling R with assist for L LE positioning into hooklying to perform L hip scoot via bridge and mod assist to prevent L knee from pushing out to the side while lifting hips (needed to scoot over to have sufficient room in bed for rolling) then rolling R with min assist and verbal cuing for technique/sequencing. Anterior peri-care with set-up assist - donned clean brief total assist - pulled pants over hips via supine bridging again with mod assist to maintain L LE alignment into hooklying when lifting hips while +2 assist pulled pants up over L hip and pt pulled up pants over R hip. Supine>sitting L EOB, HOB flat and not using bedrail, min assist for trunk upright and assist for L LE repositioning due to excessive hip ER. R squat pivot EOB>w/c min assist for lifting/pivoting hips and cuing for head/hips relationship and increased L LE WBing and muscle activation (continues to require manual facilitation/assist to position L LE in sitting prior to initiating any mobility task). Donned tennis shoes max  assist. Transported to/from gym in w/c for time management and energy conservation. L squat pivot w/c>EOM with min/mod assist for lifting/pivoting hips and continued cuing for head/hips relationship and sequencing - noticed more impaired motor planning transfers in this direction. Sit<>stands throughout session with mirror feedback and min/mod assist for balance and lifting with pt demonstrating L lateral lean - with mirror feedback and verbal cuing pt able to correct to midline with min assist though with fatigue progressing towards mod assist. 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 Standing balance and NMR with 3 Musketeer support performing repeated L LE foot taps on/off 6" step with +2 mod assist for balance - mirror feedback provided with pt demonstrating ability to use this to improve and maintain R weight shift (decrease L lean) while placing L foot on/off step with mod assist 1st set advanced to min assist 2nd set - 2x5reps. Pt demonstrating increased fatigue. L squat pivot EOM>w/c light mod assist for lifting/pivoting hips with continued cuing for sequencing  and assist for L LE positioning/management during transfer. R hemi-technique w/c mobility ~40ft with close supervision and repeated cuing for L attention and propulsion technique as pt repeatedly veers R towards the wall. Transported remainder of distance back to room and pt agreeable to remain sitting up in w/c  - left with needs in reach, seat belt alarm on, L UE supported on 1/2 lap tray with pillow for increased elbow extension, and towel roll placed next to L LE to decrease hip external rotation for improved neutral alignment.    Throughout session pt noted to demo R gaze preference requiring verbal cuing to visually attend to therapist on the L along with flat affect and delayed processing with delayed verbal response to therapist's questions.  Therapy Documentation Precautions:  Precautions Precautions: Fall,Other (comment) Precaution Comments: L hemiplegia, left neglect, hx of seizures, age indeterminate B LE DVTs Restrictions Weight Bearing Restrictions: No  Pain: No reports of pain throughout session.   Therapy/Group: Individual Therapy  Tawana Scale , PT, DPT, CSRS  03/04/2021, 6:14 PM

## 2021-03-04 NOTE — Progress Notes (Signed)
Speech Language Pathology Daily Session Note  Patient Details  Name: Matthew Wise MRN: [id number] Date of Birth: 04/09/1961  Today's Date: 03/04/2021 SLP Individual Time: 0915-1000 SLP Individual Time Calculation (min): 45 min  Short Term Goals: Week 3: SLP Short Term Goal 1 (Week 3): Pt will demonsrate efficient mastication and oral clearance of regular textured solids Mod I for use of compensatory swallow strategies (for clearance of buccal pocketing). SLP Short Term Goal 2 (Week 3): Pt will demonstrate ability to problem solve mildly complex to complex tasks with Supervision A verbal/visual cuess. SLP Short Term Goal 3 (Week 3): Pt will recall new and/or daily information with Supervisoin A cues. SLP Short Term Goal 4 (Week 3): Pt will attend to left side of his body and/or locate items to left of midline with Supervision A cueing. SLP Short Term Goal 5 (Week 3): Pt will detect functional errors with Min A cues. SLP Short Term Goal 6 (Week 3): Pt will alternate his between between functional tasks with Supervision A cues for redirection.  Skilled Therapeutic Interventions: Skilled SLP intervention focused on cognition. He completed functional reading task with everyday ads. Min A visual and verbal cues required for scanning to left visual field. He demonstrated slow processing when locating written information on food menus and sale ads but required less cues this session to locate information. He recalled 3 specific tasks completed in PT/Ot sessions with moderate verbal direction. Overall slower processing this session and increased time needed to answer simple problem solving questions using written information. Pt less talkative this session and reported he did not sleep well last night and felt more tired than usual. Cont with therapy per plan of care.      Pain Pain Assessment Pain Scale: Faces Faces Pain Scale: No hurt  Therapy/Group: Individual Therapy  Darrol Poke  [employer] 03/04/2021, 11:39 AM

## 2021-03-04 NOTE — Progress Notes (Signed)
PROGRESS NOTE   Subjective/Complaints:  No issues overnight   ROS:  Pt denies SOB, abd pain, CP, N/V/C/D, and vision changes   Objective:   No results found. Recent Labs    03/03/21 0752  WBC 4.0  HGB 12.5*  HCT 36.9*  PLT 179   Recent Labs    03/03/21 0752  NA 140  K 3.6  CL 104  CO2 30  GLUCOSE 94  BUN 17  CREATININE 1.65*  CALCIUM 10.0    Intake/Output Summary (Last 24 hours) at 03/04/2021 0811 Last data filed at 03/04/2021 0754 Gross per 24 hour  Intake 780 ml  Output 1200 ml  Net -420 ml        Physical Exam: Vital Signs Blood pressure 132/80, pulse 64, temperature 98.1 F (36.7 C), temperature source Oral, resp. rate 18, height 6' (1.829 m), weight 117.6 kg, SpO2 99 %.  General: No acute distress Mood and affect are appropriate Heart: Regular rate and rhythm no rubs murmurs or extra sounds Lungs: Clear to auscultation, breathing unlabored, no rales or wheezes Abdomen: Positive bowel sounds, soft nontender to palpation, nondistended Extremities: No clubbing, cyanosis, or edema Skin: No evidence of breakdown, no evidence of rash  Sensation absent LT on LUE and LLE  Motor:  RUE/RLE: 5/5 proximal to distal LUE/LLE: 3-/5 Left hip /knee ext synergy , 2- L pronator , Tone - MAS 3/4 in L pec, L biceps, no L ankle clonus   Assessment/Plan: 1. Functional deficits which require 3+ hours per day of interdisciplinary therapy in a comprehensive inpatient rehab setting.  Physiatrist is providing close team supervision and 24 hour management of active medical problems listed below.  Physiatrist and rehab team continue to assess barriers to discharge/monitor patient progress toward functional and medical goals  Care Tool:  Bathing    Body parts bathed by patient: Left arm,Chest,Abdomen,Right upper leg,Front perineal area,Left upper leg,Right lower leg,Face   Body parts bathed by helper: Buttocks,Left  lower leg,Right arm     Bathing assist Assist Level: Moderate Assistance - Patient 50 - 74%     Upper Body Dressing/Undressing Upper body dressing   What is the patient wearing?: Pull over shirt    Upper body assist Assist Level: Minimal Assistance - Patient > 75%    Lower Body Dressing/Undressing Lower body dressing      What is the patient wearing?: Pants,Incontinence brief     Lower body assist Assist for lower body dressing: Moderate Assistance - Patient 50 - 74%     Toileting Toileting    Toileting assist Assist for toileting: Moderate Assistance - Patient 50 - 74%     Transfers Chair/bed transfer  Transfers assist     Chair/bed transfer assist level: Moderate Assistance - Patient 50 - 74%     Locomotion Ambulation   Ambulation assist   Ambulation activity did not occur: Safety/medical concerns  Assist level: 2 helpers Assistive device: Hand held assist Max distance: 5   Walk 10 feet activity   Assist  Walk 10 feet activity did not occur: Safety/medical concerns  Assist level: 2 helpers (+56max A via 3 Musketeer) Assistive device: Other (comment) (3 Musketeer)   Walk  50 feet activity   Assist Walk 50 feet with 2 turns activity did not occur: Safety/medical concerns         Walk 150 feet activity   Assist Walk 150 feet activity did not occur: Safety/medical concerns         Walk 10 feet on uneven surface  activity   Assist Walk 10 feet on uneven surfaces activity did not occur: Safety/medical concerns         Wheelchair     Assist Will patient use wheelchair at discharge?: Yes (Yes per PT long term goals) Type of Wheelchair: Manual Wheelchair activity did not occur: Safety/medical concerns         Wheelchair 50 feet with 2 turns activity    Assist            Wheelchair 150 feet activity     Assist          Blood pressure 132/80, pulse 64, temperature 98.1 F (36.7 C), temperature source Oral,  resp. rate 18, height 6' (1.829 m), weight 117.6 kg, SpO2 99 %.   Medical Problem List and Plan: 1.  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  Continue Pradaxa.              -antiplatelet therapy: Aspirin 81 mg daily 3. Pain Management: Continue Tylenol as needed  4/2-patient denies pain; continue regimen 4. Mood: Provide emotional support             -antipsychotic agents: N/A 5. Neuropsych: This patient is capable of making decisions on his own behalf. 6. Skin/Wound Care: Routine skin checks 7. Fluids/Electrolytes/Nutrition: Routine in and outs 8.  Seizure disorder.  Continue Keppra 500 mg twice daily  No breakthrough seizures 9.  Hypertension.    Lisinopril 40 mg daily, decreased to 20 on 3/28  Hydralazine 75 mg every 8 hours  Norvasc 10 mg daily.   Vitals:   03/04/21 0330 03/04/21 0727  BP: 112/72 132/80  Pulse: (!) 57 64  Resp: 18   Temp: 98.1 F (36.7 C)   SpO2: 99%    Will reduce Lisinopril to 10mg  - monitor BP  10.  Hypothyroidism.  Synthroid 11.  CKD stage III.  Baseline creatinine 1.83.  Creatinine 1.65 on 4/4 stable  12.  History of tobacco abuse.  Provide counseling 13.  Hyperlipidemia: Lipitor 14. 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  UA unremarkable, urine culture negative   15. Seasonal allergies: continue Flonase.  16. Post stroke spasticity: tizanidine, may need Botox.  17.  Post stroke dysphagia  D3 thins, advance diet as tolerated 18. Constipation  4/2-ordered a suppository for patient to receive this afternoon, to help him empty.  LOS: 20 days A FACE TO FACE EVALUATION WAS PERFORMED  Charlett Blake 03/04/2021, 8:11 AM

## 2021-03-04 NOTE — Progress Notes (Signed)
Occupational Therapy Session Note  Patient Details  Name: Matthew Wise MRN: [id number] Date of Birth: [birth year]  Today's Date: 03/04/2021 OT Individual Time: 1005-1102 OT Individual Time Calculation (min): 57 min    Short Term Goals: Week 3:  OT Short Term Goal 1 (Week 3): Pt will donn a pullover shirt with min assist and mod demonstrational cueing for hemi dressing technique. OT Short Term Goal 2 (Week 3): Pt will complete toilet transfer with mod assist stand pivot to both directions. OT Short Term Goal 3 (Week 3): Pt will complete LB bathing with mod assist sit to stand.  Skilled Therapeutic Interventions/Progress Updates:    Pt completed supine to sit EOB with mod assist.  He then completed transfer to the toilet with use of the St[place] secondary to urgency.  Mod assist for sit to stand from the EOB with mod assist for clothing management in standing prior to sitting down on the toilet.  He was able to then complete transfer stand pivot to the wheelchair with max assist in order to wash his front and back peri area at the sink.  He was able to complete toilet hygiene with mod assist sit to stand and then donned his brief with max assist and pants at max assist as well.  Next, he was taken down to the therapy gym where he worked on standing balance at the high low table.  LUE was placed in weightbearing while reaching across midline to the left to pick up and place pegs in a peg board.  Mod to max instructional cueing was needed to initiate task and maintain sustained attention.  Increased difficulty was noted following simple peg design diagram, with mod questioning cueing to notice corrections.  He was able to maintain standing balance during task for 7-8 mins with mod assist overall to maintain left knee extension and left hand placement on the table while reaching with the right.  Finished session with transfer back to the room and pt staying up in the wheelchair.  Call button and phone in  reach with safety alarm belt in place and half lap tray supporting LUE.    Therapy Documentation Precautions:  Precautions Precautions: Fall,Other (comment) Precaution Comments: L hemiplegia, left neglect, hx of seizures, age indeterminate B LE DVTs Restrictions Weight Bearing Restrictions: No   Pain: Pain Assessment Pain Scale: Faces Pain Score: 0-No pain Faces Pain Scale: No hurt ADL: See Care Tool Section for some details of mobility and selfcare  Therapy/Group: Individual Therapy  Yarielys Beed,Ayad OTR/L 03/04/2021, 12:28 PM

## 2021-03-05 MED ORDER — MODAFINIL 100 MG PO TABS
100.0000 mg | ORAL_TABLET | Freq: Every day | ORAL | Status: DC
  Administered 2021-03-06: 100 mg via ORAL
  Filled 2021-03-05: qty 1

## 2021-03-05 NOTE — Progress Notes (Signed)
Physical Therapy Session Note  Patient Details  Name: Matthew Wise MRN: [id number] Date of Birth: 15-Dec-1960  Today's Date: 03/05/2021 PT Individual Time: 8366-2947 PT Individual Time Calculation (min): 75 min   Short Term Goals: Week 3:  PT Short Term Goal 1 (Week 3): Pt will complete bed mobility consistently with modA and no hospital bed features PT Short Term Goal 2 (Week 3): Pt will complete bed<>chair transfers with minA and LRAD PT Short Term Goal 3 (Week 3): Pt will ambulate 74ft with modA and LRAD PT Short Term Goal 4 (Week 3): Pt will propel himself in w/c 155ft with minA on level surfaces  Skilled Therapeutic Interventions/Progress Updates:    Pt received asleep, supine in bed and upon awakening agreeable to therapy session. Supine>sitting L EOB, HOB flat and not using bedrail, with CGA and significantly increased time for trunk upright due to impaired core strength. Pt continues to demonstrate L hip ER tightness resulting in poor L LE positioning sitting EOB - therapist continues to provide manual facilitation to reposition LE throughout session and provided LE support in w/c at end of session to improve midline/neutral hip alignment for low-load, long duration stretch. Sitting EOB with supervision for sitting balance, donned tennis shoes max assist. Discussed D/C plan with pt confirming his sisters, Matthew Wise and Matthew Wise, are planning to attend hands-on education/training Friday 4/8. Discussed LTGs and pt's progress with CLOF and recommendation that pt D/C at wheelchair level and receive custom wheelchair consultation - pt in agreement with this plan. Educated pt on recommendation for follow-up HHPT. R lateral scoot/squat pivot EOM>w/c min assist for lifting/pivoting hips.  Transported to/from gym in w/c for time management and energy conservation. L squat pivot w/c>EOM, cuing to scoot forward to improve technique, with min assist for lifting/pivoting hips - pt demos some decreased ability to  motor plan in this direction compared to R though improving. Sit<>stands during session without AD progressing towards min assist though inconsistent with still need for mod assist when fatigued or decreased attention to the task.   Therapist provided pt with RW and L hand-split to use for support during dynamic standing balance tasks - educated pt on how to properly place L hand on/off orthosis and proper sequencing during transfers targeting increased L hemibody attention. Dynamic standing balance task with L UE support on RW during cross body reaching to grasp clothespins from pants near R hip (targeting simulating LB clothing management) and then reaching up to L to place on basketball goal - requires CGA/min assist for balance with this - transitioned to performing the reverse movement from high to low with pt having significant worsening of L lateral lean when having to visually scan down towards R hip requiring heavy mod assist to prevent L LOB. Educated pt that during standing tasks his focus priority needs to be on balance and to discontinue any other task until he has recovered his balance.   Gait training ~11ft x2 with 3 Musketeer support and +2 mod assist for balance - no mirror feedback utilized today - pt demonstrating improved R weight shift during stance and continues to be able to advance L LE during swing with only min/mod assist to completely clear foot due to lack of ankle DF activation - continues to have L LE knee hyperextension on initial contact with delayed L anterior pelvic translation over the foot during stance requiring manual facilitation for improvement and to block knee hyperextension (would benefit from AFO trial; however, none available in pt's size).  Transported back to room and pt called his sister, Matthew Wise, to discuss D/C plans - therapist confirmed scheduled [facility] and educated her on pt's need for wheelchair and ramped entrance into the home - recommended she discuss  this with SW as well to assist with facilitating these recommendations. Pt left sitting in wc with needs in reach, seat belt alarm on, and L UE supported on 1/2 lap tray.   Therapy Documentation Precautions:  Precautions Precautions: Fall,Other (comment) Precaution Comments: L hemiplegia, left neglect, hx of seizures, age indeterminate B LE DVTs Restrictions Weight Bearing Restrictions: No  Pain: No reports of pain throughout session.    Therapy/Group: Individual Therapy  Tawana Scale , PT, DPT, CSRS  03/05/2021, 12:27 PM

## 2021-03-05 NOTE — Patient Care Conference (Signed)
Inpatient RehabilitationTeam Conference and Plan of Care Update Date: 03/05/2021   Time: 10:41 AM    Patient Name: Matthew Wise      Medical Record Number: [id number]  Date of Birth: [birth year] Sex: Male         Room/Bed: 4W19C/4W19C-01 Payor Info: Payor: [employer] MEDICARE / Plan: [employer] MEDICARE / Product Type: *No Product type* /    Admit Date/Time:  02/12/2021  1:59 PM  Primary Diagnosis:  Right middle cerebral artery stroke [facility])  Hospital Problems: Principal Problem:   Right middle cerebral artery stroke (HCC) Active Problems:   Pressure injury of skin   Urinary frequency   Dysphagia, post-stroke   Spastic hemiplegia affecting nondominant side ([place])   Drug-induced hypotension    Expected Discharge Date: Expected Discharge Date: 03/12/21  Team Members Present: Physician leading conference: Dr. Alysia Penna Care Coodinator Present: Dorien Chihuahua, RN, BSN, CRRN;Christina Los Luceros, [place] Nurse Present: Dorien Chihuahua, RN PT Present: Page Spiro, PT OT Present: Clyda Greener, OT SLP Present: Junius Argyle, SLP PPS Coordinator present : Gunnar Fusi, SLP     Current Status/Progress Goal Weekly Team Focus  Bowel/Bladder   continent of B/B needs assistance with urinal LBM 04/05  pt remain continent of B/B  toilet prn   Swallow/Nutrition/ Hydration   regular thin Supervision with this consistency.  Mod I  ongoing assessment with regular solids and independent use of swallow strategies.   ADL's   Min assist for UB bathing with mod assist for LB bathing.  max assist for LB dressing with min to mod for UB.  Squat pivot transfers mod assist as well as mod to max for toileting.  Brunnstrum stage II in the left arm and hand.  Still with left neglect and left field cut.  supervision to min assist  selfcare retraining, neuromuscular re-education, visual retraining, DME education, balance retraining, NMES   Mobility   min/mod assist bed mobility, min/mod assist  sit<>stand and squat pivot transfers, +2 mod assist gait up to 8ft using 3 Musketeer support, continues to demonstrate significant L inattention, poor awareness of deficits, and impaired processing  min assist at ambulatory level, will likely have to be downgraded  bed mobility training, transfer training, L attention, L LE NMR, gait training, sitting and standing balance, pt education, activity tolerance, wheelchair mobility   Communication             Safety/Cognition/ Behavioral Observations  Min A verbal cues for processing written information and Min A visual cues for semi complex problem solving tasks. Improving left inattention.  Supervision  semi complkex problem solving, processing speed, and awareness of errors.   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 Family edu set   Team Discussion: Poor return post stroke. Progress limited by increased tone with shoulder adduction and elbow flexor, decreased sustained attention and right gaze preference. Also note delayed processing and dis-engagement with flat affect and lethargy. MD to adjust meds prior to addition of stimulant.   Patient on target to meet rehab goals: Currently min assist for upper body care and mod instructional cues with redirection and cues for sequencing. Mod assist for lower body bathing and mod - max for lower  body dressing. Difficulty with clothes management and getting extremities in the sleeves/legs, etc. Min - mod assist for transfers and ambulating musketeer style. Min assist goals set for discharge.  *See Care Plan and progress notes for long and short-term goals.   Revisions to Treatment Plan:  Mirror feedback Focus on problem solving with SLP  Teaching Needs: Transfers, toileting,  dressing assistance, medications, secondary stroke risk management, etc.   Current Barriers to Discharge: Decreased caregiver support, Home enviroment access/layout and unsure if sister(s) can manage patient care at min assist level  Lack of insurance for SNF coverage  Possible Resolutions to Barriers: Family education     Medical Summary Current Status: BP and HR controlled, increasing tone LUE> LLE  Barriers to Discharge: Medical stability   Possible Resolutions to Barriers/Weekly Focus: Spasticity managment , oral meds +/- Botox   Continued Need for Acute Rehabilitation Level of Care: The patient requires daily medical management by a physician with specialized training in physical medicine and rehabilitation for the following reasons: Direction of a multidisciplinary physical rehabilitation program to maximize functional independence : Yes Medical management of patient stability for increased activity during participation in an intensive rehabilitation regime.: Yes Analysis of laboratory values and/or radiology reports with any subsequent need for medication adjustment and/or medical intervention. : Yes   I attest that I was present, lead the team conference, and concur with the assessment and plan of the team.   Dorien Chihuahua B 03/05/2021, 4:24 PM

## 2021-03-05 NOTE — Progress Notes (Signed)
Speech Language Pathology Daily Session Note  Patient Details  Name: Matthew Wise MRN: [id number] Date of Birth: [birth year]  Today's Date: 03/05/2021 SLP Individual Time: 0930-1030 SLP Individual Time Calculation (min): 60 min  Short Term Goals: Week 3: SLP Short Term Goal 1 (Week 3): Pt will demonsrate efficient mastication and oral clearance of regular textured solids Mod I for use of compensatory swallow strategies (for clearance of buccal pocketing). SLP Short Term Goal 2 (Week 3): Pt will demonstrate ability to problem solve mildly complex to complex tasks with Supervision A verbal/visual cuess. SLP Short Term Goal 3 (Week 3): Pt will recall new and/or daily information with Supervisoin A cues. SLP Short Term Goal 4 (Week 3): Pt will attend to left side of his body and/or locate items to left of midline with Supervision A cueing. SLP Short Term Goal 5 (Week 3): Pt will detect functional errors with Min A cues. SLP Short Term Goal 6 (Week 3): Pt will alternate his between between functional tasks with Supervision A cues for redirection.  Skilled Therapeutic Interventions: Skilled SLP intervention focused on cognition. Pt attended to left of midline for card sorting task and reading activity with Supervision A for verbal cues to locate words or cards in very far left visual field x2 during 20 min task. Mildly complex problem solving task with time calculations completed with Supervision A but increased time needed for processing. Pt alternated attention with reading/written calendar organization task with Min A verbal cues. Pt is making consistent progress with goals. He was left seated upright in wheelchair with call button within reach and chair alarm set. Cont with therapy per plan of care.       Pain Pain Assessment Pain Scale: Faces Pain Score: 0-No pain Faces Pain Scale: No hurt  Therapy/Group: Individual Therapy  Darrol Poke Lorann Tani 03/05/2021, 10:23 AM

## 2021-03-05 NOTE — Progress Notes (Signed)
Patient ID: Matthew Wise, male   DOB: [birth year], 60 y.o.   MRN: [id number]  Team Conference Report to Patient/Family  Team Conference discussion was reviewed with the patient and caregiver, including goals, any changes in plan of care and target discharge date.  Patient and caregiver express understanding and are in agreement.  The patient has a target discharge date of 03/12/21.  SW updated pt and sister of conference updates. Sw informed sister of how much care pt will require at home, sister still plans to d/c pt home. Family coming in for family edu on Friday 4/8. 9-12.  Dyanne Iha 03/05/2021, 11:11 AM

## 2021-03-05 NOTE — Progress Notes (Signed)
PROGRESS NOTE   Subjective/Complaints:  No issues overnight , pt reports good sleep and appetite , took shower with OT this am  ROS:  Pt denies SOB, abd pain, CP, N/V/C/D, and vision changes   Objective:   No results found. Recent Labs    03/03/21 0752  WBC 4.0  HGB 12.5*  HCT 36.9*  PLT 179   Recent Labs    03/03/21 0752  NA 140  K 3.6  CL 104  CO2 30  GLUCOSE 94  BUN 17  CREATININE 1.65*  CALCIUM 10.0    Intake/Output Summary (Last 24 hours) at 03/05/2021 0906 Last data filed at 03/05/2021 0612 Gross per 24 hour  Intake 840 ml  Output 925 ml  Net -85 ml        Physical Exam: Vital Signs Blood pressure 111/72, pulse (!) 58, temperature 97.6 F (36.4 C), resp. rate 17, height 6' (1.829 m), weight 117.6 kg, SpO2 99 %.  General: No acute distress Mood and affect are appropriate Heart: Regular rate and rhythm no rubs murmurs or extra sounds Lungs: Clear to auscultation, breathing unlabored, no rales or wheezes Abdomen: Positive bowel sounds, soft nontender to palpation, nondistended Extremities: No clubbing, cyanosis, or edema Skin: No evidence of breakdown, no evidence of rash  Sensation absent LT on LUE and LLE  Motor:  RUE/RLE: 5/5 proximal to distal LUE/LLE: 3-/5 Left hip /knee ext synergy , 2- L pronator , Tone - MAS 3/4 in L pec, L biceps, no L ankle clonus   Assessment/Plan: 1. Functional deficits which require 3+ hours per day of interdisciplinary therapy in a comprehensive inpatient rehab setting.  Physiatrist is providing close team supervision and 24 hour management of active medical problems listed below.  Physiatrist and rehab team continue to assess barriers to discharge/monitor patient progress toward functional and medical goals  Care Tool:  Bathing    Body parts bathed by patient: Left arm,Chest,Abdomen,Right upper leg,Front perineal area,Left upper leg,Right lower  leg,Face,Left lower leg   Body parts bathed by helper: Buttocks,Right arm     Bathing assist Assist Level: Moderate Assistance - Patient 50 - 74%     Upper Body Dressing/Undressing Upper body dressing   What is the patient wearing?: Pull over shirt    Upper body assist Assist Level: Minimal Assistance - Patient > 75%    Lower Body Dressing/Undressing Lower body dressing      What is the patient wearing?: Pants,Incontinence brief     Lower body assist Assist for lower body dressing: Moderate Assistance - Patient 50 - 74%     Toileting Toileting    Toileting assist Assist for toileting: Moderate Assistance - Patient 50 - 74%     Transfers Chair/bed transfer  Transfers assist     Chair/bed transfer assist level: Minimal Assistance - Patient > 75% (squat pivot)     Locomotion Ambulation   Ambulation assist   Ambulation activity did not occur: Safety/medical concerns  Assist level: 2 helpers Assistive device: Other (comment) (3 Musketeer) Max distance: 52f   Walk 10 feet activity   Assist  Walk 10 feet activity did not occur: Safety/medical concerns  Assist level: 2 helpers Assistive  device: Other (comment) (3 Musketeer)   Walk 50 feet activity   Assist Walk 50 feet with 2 turns activity did not occur: Safety/medical concerns         Walk 150 feet activity   Assist Walk 150 feet activity did not occur: Safety/medical concerns         Walk 10 feet on uneven surface  activity   Assist Walk 10 feet on uneven surfaces activity did not occur: Safety/medical concerns         Wheelchair     Assist Will patient use wheelchair at discharge?: Yes Type of Wheelchair: Manual Wheelchair activity did not occur: Safety/medical concerns  Wheelchair assist level: Supervision/Verbal cueing Max wheelchair distance: 25f    Wheelchair 50 feet with 2 turns activity    Assist        Assist Level: Supervision/Verbal cueing    Wheelchair 150 feet activity     Assist          Blood pressure 111/72, pulse (!) 58, temperature 97.6 F (36.4 C), resp. rate 17, height 6' (1.829 m), weight 117.6 kg, SpO2 99 %.   Medical Problem List and Plan: 1.  Left-sided hemiplegia, now with spasticity secondary to right MCA infarct likely atheroembolic status post thrombectomy as well as posterior frontoparietal region infarct              WHO/PRAFO qhs  4/2-continue PT OT and SLP Team conference today please see physician documentation under team conference tab, met with team  to discuss problems,progress, and goals. Formulized individual treatment plan based on medical history, underlying problem and comorbidities.  2.  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  Continue Pradaxa.              -antiplatelet therapy: Aspirin 81 mg daily 3. Pain Management: Continue Tylenol as needed  4/2-patient denies pain; continue regimen 4. Mood: Provide emotional support             -antipsychotic agents: N/A 5. Neuropsych: This patient is capable of making decisions on his own behalf. 6. Skin/Wound Care: Routine skin checks 7. Fluids/Electrolytes/Nutrition: Routine in and outs 8.  Seizure disorder.  Continue Keppra 500 mg twice daily  No breakthrough seizures 9.  Hypertension.    Lisinopril 40 mg daily, decreased to 20 on 3/28  Hydralazine 75 mg every 8 hours  Norvasc 10 mg daily.   Vitals:   03/05/21 0601 03/05/21 0603  BP: 111/72 111/72  Pulse:  (!) 58  Resp:  17  Temp:  97.6 F (36.4 C)  SpO2:  99%   Will reduce Lisinopril to 115m- monitor BP  10.  Hypothyroidism.  Synthroid 11.  CKD stage III.  Baseline creatinine 1.83.  Creatinine 1.65 on 4/4 stable  12.  History of tobacco abuse.  Provide counseling 13.  Hyperlipidemia: Lipitor 14. 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  UA unremarkable, urine  culture negative   15. Seasonal allergies: continue Flonase.  16. Post stroke spasticity: tizanidine, may need Botox.  17.  Post stroke dysphagia  D3 thins, advance diet as tolerated 18. Constipation  4/2-ordered a suppository for patient to receive this afternoon, to help him empty.  L[place]: [place] days [place]uel [place] 03/05/2021, 9:06 AM

## 2021-03-05 NOTE — Progress Notes (Signed)
Patient ID: Matthew Wise, male   DOB: 05/22/1961, 60 y.o.   MRN: [id number]   Family education scheduled Friday 4/8 9-12

## 2021-03-05 NOTE — Progress Notes (Signed)
Occupational Therapy Session Note  Patient Details  Name: Matthew Wise MRN: [id number] Date of Birth: Dec 18, 1960  Today's Date: 03/05/2021 OT Individual Time: 1572-6203 OT Individual Time Calculation (min): 57 min    Short Term Goals: Week 3:  OT Short Term Goal 1 (Week 3): Pt will donn a pullover shirt with min assist and mod demonstrational cueing for hemi dressing technique. OT Short Term Goal 2 (Week 3): Pt will complete toilet transfer with mod assist stand pivot to both directions. OT Short Term Goal 3 (Week 3): Pt will complete LB bathing with mod assist sit to stand.  Skilled Therapeutic Interventions/Progress Updates:    Pt in bed to start with transition to sitting with mod assist in preparation for ADL tasks.  He transferred to the shower with use of the [facility] with min assist for sit to stand.  He was able to complete doffing brief with mod assist and then pullover shirt with supervision.  Worked on bathing sit to stand at [employer] assist level overall with mod assist for initiation and sequencing.  He was able to wash the RUE using the LUE with max hand over hand.  Noted some active movement in the left shoulder and elbow to assist with activity.  No movement noted with digit flexion however when attempting to hold the soap or deodorant when opening or pouring.  He worked on dressing at the sink after max assist transfer to the wheelchair.  He was able to donn a pullover shirt with min assist.  Mod assist for orientation of the clothing and beginning with donning the LUE first.  Mod assist for donning his brief and pants as well sit to stand following hemi techniques.  He was able to donn his gripper socks with overall mod assist as well.  Finished session with work on oral hygiene in standing with min assist for balance.  He was left in the wheelchair with the call button and phone in reach with safety alarm belt in place.    Therapy Documentation Precautions:  Precautions Precautions:  Fall,Other (comment) Precaution Comments: L hemiplegia, left neglect, hx of seizures, age indeterminate B LE DVTs Restrictions Weight Bearing Restrictions: No  Pain: Pain Assessment Pain Scale: Faces Pain Score: 0-No pain ADL: See Care Tool Section for some details of mobility and selfcare  Therapy/Group: Individual Therapy  Tayshaun Kroh,[place] OTR/L 03/05/2021, 8:58 AM

## 2021-03-05 NOTE — Evaluation (Signed)
Recreational Therapy Assessment and Plan  Patient Details  Name: Matthew Wise MRN: [id number] Date of Birth: 02-17-1961 Today's Date: 03/05/2021  Rehab Potential:  good ELOS:   4/13  Assessment Hospital Problem: Principal Problem:   Right middle cerebral artery stroke [facility])   Past Medical History:      Past Medical History:  Diagnosis Date  . Acute CVA (cerebrovascular accident) ([place]) 09/09/2020  . Chronic anticoagulation 11/14/2014  . CKD (chronic kidney disease) stage 3, GFR 30-59 ml/min (HCC) 10/24/2019  . CKD (chronic kidney disease) stage 3, GFR 30-59 ml/min (HCC) 10/24/2019  . Clotting disorder ([place])   . 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 DVT (deep venous thrombosis) ([place]) 06/15/2011  . DVT of leg (deep venous thrombosis) (HCC)    rt. leg  . Essential hypertension 11/28/2020  . H/O PE (pulmonary embolism) 06/15/2011  . Hyperlipidemia 11/28/2020  . 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 Prediabetes 10/24/2019  . Thyroid nodule 01/25/2021   Past Surgical History:       Past Surgical History:  Procedure Laterality Date  . COLONOSCOPY WITH PROPOFOL N/A 10/29/2015   incomplete due to redundant colon. two simple tubular adenomas removed. patient was advised to go to [facility] for colonoscopy but he did not keep appt.  . IR CT HEAD LTD  02/02/2021  . IR PERCUTANEOUS ART THROMBECTOMY/INFUSION INTRACRANIAL INC DIAG ANGIO  02/02/2021  . RADIOLOGY WITH ANESTHESIA N/A 02/02/2021   Procedure: IR WITH ANESTHESIA;  Surgeon: Luanne Bras, MD;  Location: [facility];  Service: Radiology;  Laterality: N/A;  . TEE WITHOUT CARDIOVERSION N/A 09/10/2020   Procedure: TRANSESOPHAGEAL ECHOCARDIOGRAM (TEE) WITH PROPOFOL;  Surgeon: Satira Sark, MD;  Location: [facility];  Service: Cardiovascular;  Laterality: N/A;    Assessment & Plan Clinical Impression: Patient is a 60 y.o.  right-handed male with history of pulmonary emboli maintained on Eliquis, hypertension, CKD stage III, hyperlipidemia, prediabetes, sickle cell disease, tobacco abuse as well as right MCA infarction recently admitted 01/21/2021 to 01/27/2021 and discharged home ambulating 150 feet rolling walker. History taken from chart review and patient. Patient lives alone. 1 level home 3 steps to entry. Independent with assistive device. He has a sister who checks on him regularly. He presented on 02/03/2021 with left hemiparesis, subacute onset. Crania CT scan unremarkable for acute intracranial process. Suspect acute infarct of the right frontal parietal lobe posterior superior to chronic infarct with probable involvement of lateral precentral and postcentral gyri. Patient did not receive TPA. CT angiogram of head and neck showed abrupt occlusion of the right M1 segment new as compared to prior CTA of 01/25/2021. Acute core infarct of the posterior right frontal parietal region largely corresponding with a completed infarct as seen on prior MRI. EEG evidence of focal seizure arising from the right posterior quadrant during which patient was noted to have left upper extremity twitching. Additionally there was cortical dysfunction in the right frontal temporal region likely due to underlying stroke. Most recent echocardiogram with ejection fraction 55 to 60%. Admission chemistries unremarkable except creatinine 1.83, urine drug screen negative, hemoglobin A1c 5.9. Alcohol negative. Patient underwent arteriogram with revascularization of right MCA M1 segment per interventional radiology. Latest follow-up MRI showed large acute right MCA territory infarct with petechial hemorrhage and minimal leftward midline shift as well as small acute left cerebellar infarct new small acute early subacute infarct in left occipital left parietal left  frontal lobes. Bilateral extremity Dopplers findings consistent with  age-indeterminate DVT involving the right common femoral vein and right femoral vein as well as left femoral vein and left popliteal vein, left common femoral and SFA junction. Patient was placed on intravenous heparin remained on low-dose aspirin and Pradaxa was added. He had been loaded for Keppra for his suspected seizure. Hospital course further complicated by dysphagia, started on Dysphagia #3 thin liquid diet. Therapy evaluations completed patient was admitted due to left-sided hemiparesis for a comprehensive rehab program. Patient transferred to CIR on 02/12/2021 .   Pt presents with decreased activity tolerance, decreased functional mobility, decreased balance, decreased coordination, decreased midline orientation and decreased attention to left, decreased initiation, decreased attention, decreased awareness, decreased problem solving, decreased safety awareness, decreased memory and delayed processing Limiting pt's independence with leisure/community pursuits.  Met with pt to discuss TR services, activity analysis/adaptations and coping strategies.  Plan  Min 1 TR session >20 min per week during LOS  Recommendations for other services: None   Discharge Criteria: Patient will be discharged from TR if patient refuses treatment 3 consecutive times without medical reason.  If treatment goals not met, if there is a change in medical status, if patient makes no progress towards goals or if patient is discharged from hospital.  The above assessment, treatment plan, treatment alternatives and goals were discussed and mutually agreed upon: by patient  [place] 03/05/2021, 11:58 AM

## 2021-03-06 NOTE — Progress Notes (Signed)
Patient ID: Matthew Wise, male   DOB: 1961/01/01, 60 y.o.   MRN: [id number]   Ramp information provided to pt and sister in [place], [place]

## 2021-03-06 NOTE — Progress Notes (Signed)
Speech Language Pathology Weekly Progress and Session Note  Patient Details  Name: NEFTALY SWISS MRN: [id number] Date of Birth: 1961-02-23  Beginning of progress report period: February 27, 2021 End of progress report period: March 06, 2021  Today's Date: 03/06/2021 SLP Individual Time: 7628-3151 SLP Individual Time Calculation (min): 60 min  Short Term Goals: Week 3: SLP Short Term Goal 1 (Week 3): Pt will demonsrate efficient mastication and oral clearance of regular textured solids Mod I for use of compensatory swallow strategies (for clearance of buccal pocketing). SLP Short Term Goal 2 (Week 3): Pt will demonstrate ability to problem solve mildly complex to complex tasks with Supervision A verbal/visual cuess. SLP Short Term Goal 3 (Week 3): Pt will recall new and/or daily information with Supervisoin A cues. SLP Short Term Goal 4 (Week 3): Pt will attend to left side of his body and/or locate items to left of midline with Supervision A cueing. SLP Short Term Goal 5 (Week 3): Pt will detect functional errors with Min A cues. SLP Short Term Goal 6 (Week 3): Pt will alternate his between between functional tasks with Supervision A cues for redirection.    New Short Term Goals: Week 4: SLP Short Term Goal 1 (Week 4): STG= LTG due to ELOS  Weekly Progress Updates: Pt has made excellent gains and has met 2 out of 6 STG's this reporting period and has partially met 3/6 goals due to improved selective attention, problem solving, and left attention. Currently, pt continues to require min A for mildly complex problem solving tasks and Mod A for ability to detect errors. Pt has been upgraded to regular diet and thin liquids and is tolerating with no overt s/sx of aspiration or penetration. Pt and family education is ongoing. Pt would benefit from continued skilled SLP intervention to maximize memory and problem solving in order to maximize functional independence prior to discharge.       Intensity: Minumum of 1-2 x/day, 30 to 90 minutes Frequency: 3 to 5 out of 7 days Duration/Length of Stay: 03/12/21 Treatment/Interventions: Cognitive remediation/compensation;Cueing hierarchy;Dysphagia/aspiration precaution training;Functional tasks;Patient/family education;Internal/external aids   Daily Session  Skilled SLP intervention focused on cognition. Pt completed deductive reasoning puzzle with Mod A verbal cues and visual cues for process of elimination and reasoning. He required increased time with reading and processing written clues and consistent verbal direction to transition from one step in task to next. Min A verbal cues required with problem solving calendar organization task. Pt continues to demonstrate slow processing and needs information repeated but can complete mildly complex problem solving tasks with Min A verbal cues.  Cont with therapy per plan of care.    General    Pain Pain Assessment Pain Scale: Faces Pain Score: 0-No pain Faces Pain Scale: No hurt  Therapy/Group: Individual Therapy  Gregary Signs A Dessie Tatem 03/06/2021, 8:05 AM

## 2021-03-06 NOTE — Progress Notes (Signed)
Physical Therapy Session Note  Patient Details  Name: Matthew Wise MRN: [id number] Date of Birth: [birth year]  Today's Date: 03/06/2021 PT Individual Time: 1315-1433 PT Individual Time Calculation (min): 78 min   Short Term Goals: Week 3:  PT Short Term Goal 1 (Week 3): Pt will complete bed mobility consistently with modA and no hospital bed features PT Short Term Goal 2 (Week 3): Pt will complete bed<>chair transfers with minA and LRAD PT Short Term Goal 3 (Week 3): Pt will ambulate 11f with modA and LRAD PT Short Term Goal 4 (Week 3): Pt will propel himself in w/c 1537fwith minA on level surfaces  Skilled Therapeutic Interventions/Progress Updates:    Pt received supine with HOB elevated in bed finishing lunch. Did not report any pain and agreeable to therapy. HOB lowered and pt transferred L to sitting EOB min assist for bringing trunk to upright. Verbal cuing not required and bed rail not used. LLE repositioned due to excessive ER. Orthotist met in room. Sit to stand min assist. Gait training ~25 ft via 3 musketeer support with +2 mod assist with +3 WC follow (for safety) to allow orthotist to evaluate gait mechanics. Pt able to advance L LE with min/mod assist to clear foot due to lack of ankle DF. L knee hyperextension and L posterior hip weight shift at initial contact. According to orthotist observation, AFO not appropriate at this time. Orthotist does plan to make night splint to promote increased L dorsiflexion ROM and planning to apply toe cap to L shoe to improve LLE advancement during swing. Orthotist left, pt wheeled to therapy gym for time management. Transferred R to mat with LLE repositioning to neutral and WB facilitation through LLE, along with cuing for head/hips relationship; squat pivot min assist with scooting hips. Sit to stand min assist with cuing to lean forward. LLE step up onto box (+2 mod assist) and weight shift forward and backward and cuing to turn hips towards L  when forward to facilitate increased LLE WB in flexed position to combat L knee hyperextension. Verbal cuing required to "look up and stay upright," and shift hips "back and to the R" into +2 support. Performed 2x5 weight shifts with seated rest break. Mod-max assist required to advance LLE onto and off of box. Transferred L to WC squat pivot min assist with same technique/cuing used previously. Wheeled back to room for time management. Left sitting in WC and LUE supported on tray with towel to combat flexor tone, and with towel on lateral side of L thigh to combat LLE ER. Needs within reach and seat belt alarm turned on.  Therapy Documentation Precautions:  Precautions Precautions: Fall,Other (comment) Precaution Comments: L hemiplegia, left neglect, hx of seizures, age indeterminate B LE DVTs Restrictions Weight Bearing Restrictions: No   Therapy/Group: Individual Therapy  KiEleonore ChiquitoSPT 03/06/2021, 9:25 AM

## 2021-03-06 NOTE — Progress Notes (Signed)
PROGRESS NOTE   Subjective/Complaints:  Per OT stiffness but no pain Left shoulder incrased tone in biceps and pec ROS:  Pt denies SOB, abd pain, CP, N/V/C/D, and vision changes   Objective:   No results found. No results for input(s): WBC, HGB, HCT, PLT in the last 72 hours. No results for input(s): NA, K, CL, CO2, GLUCOSE, BUN, CREATININE, CALCIUM in the last 72 hours.  Intake/Output Summary (Last 24 hours) at 03/06/2021 0837 Last data filed at 03/06/2021 0635 Gross per 24 hour  Intake 240 ml  Output 800 ml  Net -560 ml        Physical Exam: Vital Signs Blood pressure 112/71, pulse (!) 56, temperature 98.9 F (37.2 C), temperature source Oral, resp. rate 18, height 6' (1.829 m), weight 117.6 kg, SpO2 99 %.   General: No acute distress Mood and affect are appropriate Heart: Regular rate and rhythm no rubs murmurs or extra sounds Lungs: Clear to auscultation, breathing unlabored, no rales or wheezes Abdomen: Positive bowel sounds, soft nontender to palpation, nondistended Extremities: No clubbing, cyanosis, or edema Skin: No evidence of breakdown, no evidence of rash  Sensation absent LT on LUE and LLE  Motor:  RUE/RLE: 5/5 proximal to distal LUE/LLE: 3-/5 Left hip /knee ext synergy , 2- L pronator , Tone - MAS 3/4 in L pec, L biceps, no L ankle clonus   Assessment/Plan: 1. Functional deficits which require 3+ hours per day of interdisciplinary therapy in a comprehensive inpatient rehab setting.  Physiatrist is providing close team supervision and 24 hour management of active medical problems listed below.  Physiatrist and rehab team continue to assess barriers to discharge/monitor patient progress toward functional and medical goals  Care Tool:  Bathing    Body parts bathed by patient: Left arm,Chest,Abdomen,Right upper leg,Front perineal area,Left upper leg,Right lower leg,Face,Left lower leg   Body parts  bathed by helper: Buttocks,Right arm     Bathing assist Assist Level: Moderate Assistance - Patient 50 - 74%     Upper Body Dressing/Undressing Upper body dressing   What is the patient wearing?: Pull over shirt    Upper body assist Assist Level: Minimal Assistance - Patient > 75%    Lower Body Dressing/Undressing Lower body dressing      What is the patient wearing?: Pants,Incontinence brief     Lower body assist Assist for lower body dressing: Moderate Assistance - Patient 50 - 74%     Toileting Toileting    Toileting assist Assist for toileting: Moderate Assistance - Patient 50 - 74%     Transfers Chair/bed transfer  Transfers assist     Chair/bed transfer assist level: Minimal Assistance - Patient > 75% (squat pivot)     Locomotion Ambulation   Ambulation assist   Ambulation activity did not occur: Safety/medical concerns  Assist level: 2 helpers Assistive device: Other (comment) (3 Musketeer) Max distance: 12ft   Walk 10 feet activity   Assist  Walk 10 feet activity did not occur: Safety/medical concerns  Assist level: 2 helpers Assistive device: Other (comment) (3 Musketeer)   Walk 50 feet activity   Assist Walk 50 feet with 2 turns activity did not occur:  Safety/medical concerns         Walk 150 feet activity   Assist Walk 150 feet activity did not occur: Safety/medical concerns         Walk 10 feet on uneven surface  activity   Assist Walk 10 feet on uneven surfaces activity did not occur: Safety/medical concerns         Wheelchair     Assist Will patient use wheelchair at discharge?: Yes Type of Wheelchair: Manual Wheelchair activity did not occur: Safety/medical concerns  Wheelchair assist level: Supervision/Verbal cueing Max wheelchair distance: 56ft    Wheelchair 50 feet with 2 turns activity    Assist        Assist Level: Supervision/Verbal cueing   Wheelchair 150 feet activity      Assist          Blood pressure 112/71, pulse (!) 56, temperature 98.9 F (37.2 C), temperature source Oral, resp. rate 18, height 6' (1.829 m), weight 117.6 kg, SpO2 99 %.   Medical Problem List and Plan: 1.  Left-sided hemiplegia, now with spasticity secondary to right MCA infarct likely atheroembolic status post thrombectomy as well as posterior frontoparietal region infarct              WHO/PRAFO qhs  4/2-continue PT OT and SLP   2.  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  Continue Pradaxa.              -antiplatelet therapy: Aspirin 81 mg daily 3. Pain Management: Continue Tylenol as needed  4/2-patient denies pain; continue regimen 4. Mood: Provide emotional support             -antipsychotic agents: N/A 5. Neuropsych: This patient is capable of making decisions on his own behalf. 6. Skin/Wound Care: Routine skin checks 7. Fluids/Electrolytes/Nutrition: Routine in and outs 8.  Seizure disorder.  Continue Keppra 500 mg twice daily  No breakthrough seizures 9.  Hypertension.    Lisinopril 40 mg daily, decreased to 20 on 3/28  Hydralazine 75 mg every 8 hours  Norvasc 10 mg daily.   Vitals:   03/05/21 1913 03/06/21 0438  BP: (!) 107/49 112/71  Pulse: 62 (!) 56  Resp: 16 18  Temp: 99.9 F (37.7 C) 98.9 F (37.2 C)  SpO2: 100% 99%   Will reduce Lisinopril to 10mg  - monitor BP  10.  Hypothyroidism.  Synthroid 11.  CKD stage III.  Baseline creatinine 1.83.  Creatinine 1.65 on 4/4 stable  12.  History of tobacco abuse.  Provide counseling 13.  Hyperlipidemia: Lipitor 14. 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  UA unremarkable, urine culture negative   15. Seasonal allergies: continue Flonase.  16. Post stroke spasticity: tizanidine, may need Botox. To pec and biceps next week  17.  Post stroke dysphagia  D3 thins, advance diet as tolerated 18.  Constipation  4/2-ordered a suppository for patient to receive this afternoon, to help him empty.  LOS: 22 days A FACE TO [place] E Shekela Goodridge 03/06/2021, 8:37 AM

## 2021-03-06 NOTE — Progress Notes (Signed)
Occupational Therapy Session Note  Patient Details  Name: Matthew Wise MRN: [id number] Date of Birth: 10-12-1961  Today's Date: 03/06/2021 OT Individual Time: 0802-0900 OT Individual Time Calculation (min): 58 min    Short Term Goals: Week 3:  OT Short Term Goal 1 (Week 3): Pt will donn a pullover shirt with min assist and mod demonstrational cueing for hemi dressing technique. OT Short Term Goal 2 (Week 3): Pt will complete toilet transfer with mod assist stand pivot to both directions. OT Short Term Goal 3 (Week 3): Pt will complete LB bathing with mod assist sit to stand.  Skilled Therapeutic Interventions/Progress Updates:    Pt in bed to start session.  He was able to transition from sidelying on the left side to sitting with mod assist.  He then worked on donning his pants sit to stand with mod assist following hemi techniques.  Min assist for sit stand with use of the RW for support, with mod assist for pulling the pants over his hips.  He then completed squat pivot transfer to the wheelchair with min assist.   Took him down to the ortho gym where he completed LUE neuromuscular re-education with use of the UE ergonometer.  Ace bandage was used for assisting to keep the LUE on the hand grip.  He completed four mins using BUEs with resistance on level one.  He then completed 4 more mins with isolated LUE use only with max facilitation.  He was able to demonstrate some active elbow extension noted with each revolution.  Next, took him back to the room where NMES was applied to the dorsal left forearm for facilitation of digit extension.  He tolerated 10 mins of active stimulation on level 8, with therapist also instructing him to work on actively assisting with digit extension.  Parameters of stimulation listed below.  No adverse reactions or pain noted.  He was left sitting up in the wheelchair with the call button and phone in reach and safety belt in place.   [place]o Stim One 330 pulse  width 35 Hz pulse rate On 8 sec/ off 8 sec Ramp up/ down 2 sec Symmetrical Biphasic wave form  Max intensity 17mA at 500 Ohm load  Therapy Documentation Precautions:  Precautions Precautions: Fall,Other (comment) Precaution Comments: L hemiplegia, left neglect, hx of seizures, age indeterminate B LE DVTs Restrictions Weight Bearing Restrictions: No  Pain: Pain Assessment Pain Scale: Faces Pain Score: 0-No pain Faces Pain Scale: No hurt ADL: See Care Tool Section for some details of mobility and selfcare  Therapy/Group: Individual Therapy  Suhaila Troiano,Trystin OTR/L 03/06/2021, 9:00 AM

## 2021-03-07 MED ORDER — MODAFINIL 100 MG PO TABS
200.0000 mg | ORAL_TABLET | Freq: Every day | ORAL | Status: DC
  Administered 2021-03-08 – 2021-03-12 (×5): 200 mg via ORAL
  Filled 2021-03-07 (×5): qty 2

## 2021-03-07 NOTE — Progress Notes (Signed)
Physical Therapy Weekly Progress Note  Patient Details  Name: Matthew Wise MRN: [id number] Date of Birth: 1961/04/27  Beginning of progress report period: March 01, 2021 End of progress report period: March 10, 2021  Today's Date: 03/07/2021 PT Individual Time: 5400-8676 ; 0800-0900 PT Individual Time Calculation (min): 45 min ; 60 mins   Patient has met 3 of 4 short term goals.  Patient is making slow progress toward his goals. Goals were recently downgraded to MinA/ModA overall due to slow progress. He is limited by significant L inattention/strong R gaze preference, poor L hemi coordination, poor sitting/standing balance, L UE tone.   Patient continues to demonstrate the following deficits muscle weakness and muscle joint tightness, decreased cardiorespiratoy endurance, impaired timing and sequencing, abnormal tone, unbalanced muscle activation, motor apraxia, decreased coordination and decreased motor planning, decreased visual perceptual skills, decreased midline orientation, decreased attention to left, left side neglect and decreased motor planning, decreased initiation, decreased attention, decreased awareness, decreased problem solving, decreased safety awareness, decreased memory and delayed processing and decreased sitting balance, decreased standing balance, decreased postural control, hemiplegia and decreased balance strategies and therefore will continue to benefit from skilled PT intervention to increase functional independence with mobility.  Patient not progressing toward long term goals.  See goal revision..  Continue plan of care.  PT Short Term Goals Week 3:  PT Short Term Goal 1 (Week 3): Pt will complete bed mobility consistently with modA and no hospital bed features PT Short Term Goal 1 - Progress (Week 3): Met PT Short Term Goal 2 (Week 3): Pt will complete bed<>chair transfers with minA and LRAD PT Short Term Goal 2 - Progress (Week 3): Progressing toward goal PT  Short Term Goal 3 (Week 3): Pt will ambulate 32f with modA and LRAD PT Short Term Goal 3 - Progress (Week 3): Progressing toward goal PT Short Term Goal 4 (Week 3): Pt will propel himself in w/c [id number] minA on level surfaces PT Short Term Goal 4 - Progress (Week 3): Met Week 4:  PT Short Term Goal 1 (Week 4): STG= LTGs based on ELOS  Skilled Therapeutic Interventions/Progress Updates:    Session 1: Patient received supine in bed, agreeable to PT and wc eval. ATP at bedside. Patient denies pain. MaxA LB dressing including TED hose and socks. ModA supine > sit with HOB slightly elevated. MaxA squat pivot to wc. PT transporting patient in wc to therapy gym for time management and energy conservation. ATP and PT completing wc eval. Patient able to propel himself [id number] R hemi technique and CGA. Consistent verbal cues to attend to task and scan L to avoid obstacles. Patient completing standing NMR task with R UE supported on table to complete peg board pattern. He was able to correct posture (minimize L lateral lean) with verbal cues, but whenever he would go to place a peg, L lateral lean would worsen. Patient unable to complete simple dual task. He required Max verbal cues to complete peg board pattern accurately and demonstrated very poor attention to task, though appeared to be more internally distracted. Patient requesting to return to room to wait for sisters for family ed.    Session 2: Patient up in chair awaiting family for scheduled family ed. PT reviewing home modifications and activity modifications/safety with patient prior to family arriving. Sisters did arrive late to family ed limiting time available to review safe transfers and specifics of safety when assisting patient with mobility. PT transporting patient in wc  back to therapy gym with sisters present. Discussed use of gait belt, how to properly set up wc for transfers, attempting to transfer toward strong side for all transfers  when possible. Also reviewed that patient is not safe to ambulate with family at home, and so will be d/cing at wc level. Noted that custom wc eval was completed this AM. PT demonstrating lateral scoot transfer and squat pivot transfer. Family reports that patients bed is nearly the height of wc. Sister able to perform both lateral scoot and squat pivot leading R and L, but 2nd sister needed behind patient to safely assist with getting patient hips to mat/ chair. Sisters report that 2 people will be present at home to assist with these transfers for safety. PT demonstrating bed mobility with supine <> sit transition. Sisters able to safely demonstrate this as well. PT reiterating that physical assist is likely a life-long need for patient, though at varying levels. They verbalized understanding. Patient returning to room in wc, seatbelt alarm on, call light within reach. Sisters agreeable to return Monday afternoon for further family ed.    Therapy Documentation Precautions:  Precautions Precautions: Fall,Other (comment) Precaution Comments: L hemiplegia, left neglect, hx of seizures, age indeterminate B LE DVTs Restrictions Weight Bearing Restrictions: No   Therapy/Group: Individual Therapy  Karoline Caldwell, PT, DPT, CBIS  03/07/2021, 7:34 AM

## 2021-03-07 NOTE — Progress Notes (Signed)
Occupational Therapy Weekly Progress Note  Patient Details  Name: Matthew Wise MRN: [id number] Date of Birth: 1961-07-09  Beginning of progress report period: March 01, 2021 End of progress report period: March 07, 2021  Today's Date: 03/07/2021 OT Individual Time: 1035-1130 OT Individual Time Calculation (min): 55 min    Patient has met 3 of 3 short term goals.  Pt is making steady but slow progress with OT at this time.  He currently still needs min assist for UB selfcare with mod demonstrational cueing for sequencing.  LB selfcare is at mod assist level sit to stand following hemi techniques.  Transfers are currently min assist squat pivot to the right and min to mod to the left.  Stand pivot transfers are still max assist level stand pivot however.  He continues to demonstrate LUE hemiparesis with functional movement at Brunnstrum stage III in the arm and stage II in the hand.  Increased adductor tone is noted in the pectoral as well as the elbow flexors.  Max hand over hand assist is needed for integration into selfcare tasks.  He also continues to maintain right gaze preference and head turn, requiring min to mod instructional cueing to scan to the left and help locate items on the left side of the sink or manage his LUE.  Overall, feel he is making steady progress with at a slower rate than originally expected and feel he will continue to benefit from continued CIR level therapy with expected discharge on 4/13.  Will continued with family education in preparation for discharge.    Patient continues to demonstrate the following deficits: muscle weakness and muscle paralysis, impaired timing and sequencing, abnormal tone, unbalanced muscle activation and decreased coordination, field cut, decreased attention to left, decreased initiation, decreased attention, decreased awareness, decreased problem solving, decreased safety awareness, decreased memory and delayed processing and decreased sitting  balance, decreased standing balance, decreased postural control, hemiplegia and decreased balance strategies and therefore will continue to benefit from skilled OT intervention to enhance overall performance with BADL and Reduce care partner burden.  Patient progressing toward long term goals..  Continue plan of care.  OT Short Term Goals Week 4:  OT Short Term Goal 1 (Week 4): Continue working on established LTGs set at min assist level.  Skilled Therapeutic Interventions/Progress Updates:    Pt's sisters in for family education this session.  He was able to complete squat pivot transfers to the drop arm commode with min to mod assist with therapist as well as his one sister helping.  He completed sit to stand for clothing management with min assist with use of the RW for support of the RUE.  His sister was able to provide max assist for clothing management and toilet hygiene in standing while pt maintained balance with the RW.  When pt attempts to assist with clothing management or hygiene at this point, he tends to lose his balance and pushes to the left.  Felt at home initially it will be much safer for him to maintain standing while family assists with clothing and hygiene.  Finished session with pt sitting up in the wheelchair with the call button and phone in reach and safety belt in place.  Discussed the need for further education on Monday and possibly Tuesday in preparation for discharge home.    Therapy Documentation Precautions:  Precautions Precautions: Fall,Other (comment) Precaution Comments: L hemiplegia, left neglect, hx of seizures, age indeterminate B LE DVTs Restrictions Weight Bearing Restrictions: No  Pain: Pain Assessment Pain Scale: Faces Pain Score: 0-No pain Pain Type: Acute pain ADL: See Care Tool Section for some details of mobility and selfcare  Therapy/Group: Individual Therapy  Catalina Salasar,Everardo OTR/L 03/07/2021, 12:18 PM

## 2021-03-07 NOTE — Progress Notes (Signed)
Speech Language Pathology Daily Session Note  Patient Details  Name: MIKHI ATHEY MRN: [id number] Date of Birth: [birth year]  Today's Date: 03/07/2021 SLP Individual Time: 1300-1330 SLP Individual Time Calculation (min): 30 min  Short Term Goals: Week 4: SLP Short Term Goal 1 (Week 4): STG= LTG due to ELOS  Skilled Therapeutic Interventions:   Patient seen for skilled ST session focusing on cognitive goals. He required extra processing time to locate information on simulated medication labels and minA cues to answer questions for 80% accuracy. He answered basic level questions based on printed instructions (1-step) with 70% accuracy. He did independently recall earlier discussion with SLP when his sister asked about hospital bed; SLP informed him that PT and OT are confirming this with social work/insurance. Patient did not exhibit any significant left neglect during today's tasks. He continues to benefit from skilled SLP intervention to maximize cognitive function and swallow function prior to discharge.  Pain Pain Assessment Pain Scale: 0-10 Pain Score: 0-No pain  Therapy/Group: Individual Therapy  Sonia Baller, MA, CCC-SLP Speech Therapy

## 2021-03-07 NOTE — Plan of Care (Signed)
Problem: RH Ambulation Goal: LTG Patient will ambulate in controlled environment (PT) Description: LTG: Patient will ambulate in a controlled environment, # of feet with assistance (PT). Outcome: Not Applicable Note: Patient not safe to ambulate at home with family     Problem: RH Stairs Goal: LTG Patient will ambulate up and down stairs w/assist (PT) Description: LTG: Patient will ambulate up and down # of stairs with assistance (PT) Outcome: Not Applicable Note: Patient not safe to negotiate stairs with family, family installing ramp   Problem: RH Balance Goal: LTG Patient will maintain dynamic sitting balance (PT) Description: LTG:  Patient will maintain dynamic sitting balance with assistance during mobility activities (PT) Flowsheets (Taken 03/07/2021 1101) LTG: Pt will maintain dynamic sitting balance during mobility activities with:: (downgraded due to patient progress) Contact Guard/Touching assist Note: downgraded due to patient progress Goal: LTG Patient will maintain dynamic standing balance (PT) Description: LTG:  Patient will maintain dynamic standing balance with assistance during mobility activities (PT) Flowsheets (Taken 03/07/2021 1101) LTG: Pt will maintain dynamic standing balance during mobility activities with:: (downgraded due to patient progress) Moderate Assistance - Patient 50 - 74% Note: downgraded due to patient progress   Problem: Sit to Stand Goal: LTG:  Patient will perform sit to stand with assistance level (PT) Description: LTG:  Patient will perform sit to stand with assistance level (PT) Flowsheets (Taken 03/07/2021 1101) LTG: PT will perform sit to stand in preparation for functional mobility with assistance level: (downgraded due to patient progress) Minimal Assistance - Patient > 75% Note: downgraded due to patient progress   Problem: RH Bed Mobility Goal: LTG Patient will perform bed mobility with assist (PT) Description: LTG: Patient will perform  bed mobility with assistance, with/without cues (PT). Flowsheets (Taken 03/07/2021 1101) LTG: Pt will perform bed mobility with assistance level of: (downgraded due to patient progress) Minimal Assistance - Patient > 75% Note: downgraded due to patient progress   Problem: RH Bed to Chair Transfers Goal: LTG Patient will perform bed/chair transfers w/assist (PT) Description: LTG: Patient will perform bed to chair transfers with assistance (PT). Flowsheets (Taken 03/07/2021 1101) LTG: Pt will perform Bed to Chair Transfers with assistance level: (downgraded due to patient progress) Moderate Assistance - Patient 50 - 74% Note: downgraded due to patient progress   Problem: RH Car Transfers Goal: LTG Patient will perform car transfers with assist (PT) Description: LTG: Patient will perform car transfers with assistance (PT). Flowsheets (Taken 03/07/2021 1101) LTG: Pt will perform car transfers with assist:: (downgraded due to patient progress) Moderate Assistance - Patient 50 - 74% Note: downgraded due to patient progress   Problem: RH Ambulation Goal: LTG Patient will ambulate in home environment (PT) Description: LTG: Patient will ambulate in home environment, # of feet with assistance (PT). Note: Patient not safe to ambulate at home with family     Problem: RH Wheelchair Mobility Goal: LTG Patient will propel w/c in controlled environment (PT) Description: LTG: Patient will propel wheelchair in controlled environment, # of feet with assist (PT) Flowsheets (Taken 03/07/2021 1101) LTG: Pt will propel w/c in controlled environ  assist needed:: (downgraded due to patient progress) Contact Guard/Touching assist Note: downgraded due to patient progress Goal: LTG Patient will propel w/c in home environment (PT) Description: LTG: Patient will propel wheelchair in home environment, # of feet with assistance (PT). Flowsheets (Taken 03/07/2021 1101) LTG: Pt will propel w/c in home environ  assist needed::  (downgraded due to patient progress) Contact Guard/Touching assist Note: downgraded due  to patient progress

## 2021-03-07 NOTE — Progress Notes (Signed)
PROGRESS NOTE   Subjective/Complaints:  ROS:  Pt denies SOB, abd pain, CP, N/V/C/D, and vision changes   Objective:   No results found. No results for input(s): WBC, HGB, HCT, PLT in the last 72 hours. No results for input(s): NA, K, CL, CO2, GLUCOSE, BUN, CREATININE, CALCIUM in the last 72 hours.  Intake/Output Summary (Last 24 hours) at 03/07/2021 0833 Last data filed at 03/07/2021 0500 Gross per 24 hour  Intake 840 ml  Output 1100 ml  Net -260 ml        Physical Exam: Vital Signs Blood pressure (!) 106/53, pulse 65, temperature 98.5 F (36.9 C), temperature source Oral, resp. rate 18, height 6' (1.829 m), weight 117.6 kg, SpO2 100 %.   General: No acute distress Mood and affect are appropriate Heart: Regular rate and rhythm no rubs murmurs or extra sounds Lungs: Clear to auscultation, breathing unlabored, no rales or wheezes Abdomen: Positive bowel sounds, soft nontender to palpation, nondistended Extremities: No clubbing, cyanosis, or edema Skin: No evidence of breakdown, no evidence of rash  Sensation absent LT on LUE and LLE  Motor:  RUE/RLE: 5/5 proximal to distal LUE/LLE: 3-/5 Left hip /knee ext synergy , 2- L pronator , Tone - MAS 3/4 in L pec, L biceps, no L ankle clonus   Assessment/Plan: 1. Functional deficits which require 3+ hours per day of interdisciplinary therapy in a comprehensive inpatient rehab setting.  Physiatrist is providing close team supervision and 24 hour management of active medical problems listed below.  Physiatrist and rehab team continue to assess barriers to discharge/monitor patient progress toward functional and medical goals  Care Tool:  Bathing    Body parts bathed by patient: Left arm,Chest,Abdomen,Right upper leg,Front perineal area,Left upper leg,Right lower leg,Face,Left lower leg   Body parts bathed by helper: Buttocks,Right arm     Bathing assist Assist Level:  Moderate Assistance - Patient 50 - 74%     Upper Body Dressing/Undressing Upper body dressing   What is the patient wearing?: Pull over shirt    Upper body assist Assist Level: Minimal Assistance - Patient > 75%    Lower Body Dressing/Undressing Lower body dressing      What is the patient wearing?: Pants,Incontinence brief     Lower body assist Assist for lower body dressing: Moderate Assistance - Patient 50 - 74%     Toileting Toileting    Toileting assist Assist for toileting: Moderate Assistance - Patient 50 - 74%     Transfers Chair/bed transfer  Transfers assist     Chair/bed transfer assist level: Minimal Assistance - Patient > 75%     Locomotion Ambulation   Ambulation assist   Ambulation activity did not occur: Safety/medical concerns  Assist level: 2 helpers Assistive device: Other (comment) (3 Musketeer) Max distance: 11ft   Walk 10 feet activity   Assist  Walk 10 feet activity did not occur: Safety/medical concerns  Assist level: 2 helpers Assistive device: Other (comment) (3 Musketeer)   Walk 50 feet activity   Assist Walk 50 feet with 2 turns activity did not occur: Safety/medical concerns         Walk 150 feet activity  Assist Walk 150 feet activity did not occur: Safety/medical concerns         Walk 10 feet on uneven surface  activity   Assist Walk 10 feet on uneven surfaces activity did not occur: Safety/medical concerns         Wheelchair     Assist Will patient use wheelchair at discharge?: Yes Type of Wheelchair: Manual Wheelchair activity did not occur: Safety/medical concerns  Wheelchair assist level: Supervision/Verbal cueing Max wheelchair distance: 15ft    Wheelchair 50 feet with 2 turns activity    Assist        Assist Level: Supervision/Verbal cueing   Wheelchair 150 feet activity     Assist          Blood pressure (!) 106/53, pulse 65, temperature 98.5 F (36.9 C),  temperature source Oral, resp. rate 18, height 6' (1.829 m), weight 117.6 kg, SpO2 100 %.   Medical Problem List and Plan: 1.  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  Continue Pradaxa.              -antiplatelet therapy: Aspirin 81 mg daily 3. Pain Management: Continue Tylenol as needed  4/2-patient denies pain; continue regimen 4. Mood: Provide emotional support             -antipsychotic agents: N/A 5. Neuropsych: This patient is capable of making decisions on his own behalf. 6. Skin/Wound Care: Routine skin checks 7. Fluids/Electrolytes/Nutrition: Routine in and outs 8.  Seizure disorder.  Continue Keppra 500 mg twice daily  No breakthrough seizures 9.  Hypertension.    Lisinopril 40 mg daily, decreased to 20 on 3/28  Hydralazine 50 mg every 8 hours  Norvasc 10 mg daily.   Vitals:   03/06/21 1955 03/07/21 0426  BP: 123/64 (!) 106/53  Pulse: 60 65  Resp: 18 18  Temp: 98.2 F (36.8 C) 98.5 F (36.9 C)  SpO2: 100% 100%   Will reduce Lisinopril to 10mg  - monitor BP  10.  Hypothyroidism.  Synthroid 11.  CKD stage III.  Baseline creatinine 1.83.  Creatinine 1.65 on 4/4 stable  12.  History of tobacco abuse.  Provide counseling 13.  Hyperlipidemia: Lipitor 14. 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  UA unremarkable, urine culture negative   15. Seasonal allergies: continue Flonase.  16. Post stroke spasticity:holding  Tizanidine due to drowsiness , may need Botox. To pec and biceps next week  17.  Post stroke dysphagia  D3 thins, advance diet as tolerated 18. Constipation  4/2-ordered a suppository for patient to  receive this afternoon, to help him empty.  LOS: 23 days A FACE TO FACE EVALUATION WAS PERFORMED  Charlett Blake 03/07/2021, 8:33 AM

## 2021-03-07 NOTE — Progress Notes (Signed)
Speech Language Pathology Daily Session Note  Patient Details  Name: Matthew Wise MRN: [id number] Date of Birth: [birth year]  Today's Date: 03/07/2021 SLP Individual Time: 1130-1155 SLP Individual Time Calculation (min): 25 min  Short Term Goals: Week 4: SLP Short Term Goal 1 (Week 4): STG= LTG due to ELOS  Skilled Therapeutic Interventions:   Patient seen with sisters present for family education due to upcoming discharge home beginning of next week. Patient did acknowledge being a little nervous about discharge thinking about how his sisters would be able to help him safely. Sisters asked about possible hospital bed and SLP communicated this to PT and OT. SLP focused on patient's left inattention, giving examples of how that would impact his ADL function as well as suggestions for working on things at home, (doing pill box together etc.) SLP also discussed benefit from making routine/schedule such as showering 2-3 times a week and other days washing up at sink. Education completed to satisfaction of patient and familly and SLP.   Pain Pain Assessment Pain Scale: 0-10 Pain Score: 0-No pain Pain Type: Acute pain  Therapy/Group: Individual Therapy  Sonia Baller, MA, CCC-SLP Speech Therapy

## 2021-03-08 NOTE — Progress Notes (Signed)
Speech Language Pathology Daily Session Note  Patient Details  Name: KORVER GRAYBEAL MRN: [id number] Date of Birth: 07/03/1961  Today's Date: 03/08/2021 SLP Individual Time: 1330-1410 SLP Individual Time Calculation (min): 40 min  Short Term Goals: Week 4: SLP Short Term Goal 1 (Week 4): STG= LTG due to ELOS  Skilled Therapeutic Interventions:   Patient seen for skilled SLP intervention to focus on cognitive function goals. Patient did have family member and friend in room and in addition he was distracted by TV but he was able to perform pill box simulation task. Patient able to read medication list to find amount and time/times of day to take with minA cues. He required modA to check for errors but when SLP provided cues, he was able to then find specific error and correct it. He also required extra time to complete as well as min-modA cues for visual scanning/attention to left side of pill box. Patient seemed pleased he could perform this task but he did not exhibit a reasonable interpretation of time he would need to spend on it at home, as we did two medications and it took over 20 minutes and he stated it would take "about 30 minutes" to do 6 medications. Patient continues to benefit from skilled SLP intervention to maximize cognitive-linguistic goals prior to discharge.  Pain Pain Assessment Pain Scale: 0-10 Pain Score: 0-No pain  Therapy/Group: Individual Therapy  Sonia Baller, MA, CCC-SLP Speech Therapy

## 2021-03-09 NOTE — Progress Notes (Signed)
Slept good. BLE edema, left >right. Left PRAFO boot applied, difficult to keep foot aligned in boot, even with kickstand engaged. Left WHO applied. Tone to LUE & LLE. Using urinal independently, occasionally spills. LBM 4/9, incontinent. Denies pain. [name]

## 2021-03-09 NOTE — Progress Notes (Signed)
PROGRESS NOTE   Subjective/Complaints: No issues overnite ROS:  Pt denies SOB, abd pain, CP, N/V/C/D, and vision changes   Objective:   No results found. No results for input(s): WBC, HGB, HCT, PLT in the last 72 hours. No results for input(s): NA, K, CL, CO2, GLUCOSE, BUN, CREATININE, CALCIUM in the last 72 hours.  Intake/Output Summary (Last 24 hours) at 03/09/2021 0759 Last data filed at 03/09/2021 0455 Gross per 24 hour  Intake 720 ml  Output 925 ml  Net -205 ml        Physical Exam: Vital Signs Blood pressure 104/61, pulse (!) 55, temperature 98 F (36.7 C), resp. rate 17, height 6' (1.829 m), weight 117.6 kg, SpO2 100 %.   General: No acute distress Mood and affect are appropriate Heart: Regular rate and rhythm no rubs murmurs or extra sounds Lungs: Clear to auscultation, breathing unlabored, no rales or wheezes Abdomen: Positive bowel sounds, soft nontender to palpation, nondistended Extremities: No clubbing, cyanosis, or edema Skin: No evidence of breakdown, no evidence of rash  Sensation absent LT on LUE and LLE  Motor:  RUE/RLE: 5/5 proximal to distal LUE/LLE: 3-/5 Left hip /knee ext synergy , 2- L pronator , Tone - MAS 3/4 in L pec, L biceps, no L ankle clonus   Assessment/Plan: 1. Functional deficits which require 3+ hours per day of interdisciplinary therapy in a comprehensive inpatient rehab setting.  Physiatrist is providing close team supervision and 24 hour management of active medical problems listed below.  Physiatrist and rehab team continue to assess barriers to discharge/monitor patient progress toward functional and medical goals  Care Tool:  Bathing    Body parts bathed by patient: Left arm,Chest,Abdomen,Right upper leg,Front perineal area,Left upper leg,Right lower leg,Face,Left lower leg   Body parts bathed by helper: Buttocks,Right arm     Bathing assist Assist Level: Moderate  Assistance - Patient 50 - 74%     Upper Body Dressing/Undressing Upper body dressing   What is the patient wearing?: Pull over shirt    Upper body assist Assist Level: Minimal Assistance - Patient > 75%    Lower Body Dressing/Undressing Lower body dressing      What is the patient wearing?: Pants,Incontinence brief     Lower body assist Assist for lower body dressing: Moderate Assistance - Patient 50 - 74%     Toileting Toileting    Toileting assist Assist for toileting: Maximal Assistance - Patient 25 - 49%     Transfers Chair/bed transfer  Transfers assist     Chair/bed transfer assist level: Moderate Assistance - Patient 50 - 74%     Locomotion Ambulation   Ambulation assist   Ambulation activity did not occur: Safety/medical concerns  Assist level: 2 helpers Assistive device: Other (comment) (3 Musketeer) Max distance: 37ft   Walk 10 feet activity   Assist  Walk 10 feet activity did not occur: Safety/medical concerns  Assist level: 2 helpers Assistive device: Other (comment) (3 Musketeer)   Walk 50 feet activity   Assist Walk 50 feet with 2 turns activity did not occur: Safety/medical concerns         Walk 150 feet activity  Assist Walk 150 feet activity did not occur: Safety/medical concerns         Walk 10 feet on uneven surface  activity   Assist Walk 10 feet on uneven surfaces activity did not occur: Safety/medical concerns         Wheelchair     Assist Will patient use wheelchair at discharge?: Yes Type of Wheelchair: Manual Wheelchair activity did not occur: Safety/medical concerns  Wheelchair assist level: Contact Guard/Touching assist Max wheelchair distance: 150    Wheelchair 50 feet with 2 turns activity    Assist        Assist Level: Contact Guard/Touching assist   Wheelchair 150 feet activity     Assist      Assist Level: Contact Guard/Touching assist   Blood pressure 104/61, pulse  (!) 55, temperature 98 F (36.7 C), resp. rate 17, height 6' (1.829 m), weight 117.6 kg, SpO2 100 %.   Medical Problem List and Plan: 1.  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  Continue Pradaxa.              -antiplatelet therapy: Aspirin 81 mg daily 3. Pain Management: Continue Tylenol as needed  4/2-patient denies pain; continue regimen 4. Mood: Provide emotional support             -antipsychotic agents: N/A 5. Neuropsych: This patient is capable of making decisions on his own behalf. 6. Skin/Wound Care: Routine skin checks 7. Fluids/Electrolytes/Nutrition: Routine in and outs 8.  Seizure disorder.  Continue Keppra 500 mg twice daily  No breakthrough seizures 9.  Hypertension.    Lisinopril 40 mg daily, decreased to 20 on 3/28  Hydralazine 50 mg every 8 hours  Norvasc 10 mg daily.   Vitals:   03/09/21 0450 03/09/21 0721  BP: (!) 103/56 104/61  Pulse: 60 (!) 55  Resp: 17   Temp: 98 F (36.7 C)   SpO2: 100%    Will reduce Lisinopril to 10mg  - monitor BP  10.  Hypothyroidism.  Synthroid 11.  CKD stage III.  Baseline creatinine 1.83.  Creatinine 1.65 on 4/4 stable  12.  History of tobacco abuse.  Provide counseling 13.  Hyperlipidemia: Lipitor 14. 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  UA unremarkable, urine culture negative   15. Seasonal allergies: continue Flonase.  16. Post stroke spasticity:holding  Tizanidine due to drowsiness , may need Botox. To pec and biceps next week  17.  Post stroke dysphagia  D3 thins, advance diet as tolerated 18. Constipation  4/2-ordered a suppository for patient to receive  this afternoon, to help him empty.  LOS: 25 days A FACE TO FACE EVALUATION WAS PERFORMED  Charlett Blake 03/09/2021, 7:59 AM

## 2021-03-10 LAB — CBC
HCT: 36.9 % — ABNORMAL LOW (ref 39.0–52.0)
Hemoglobin: 12.2 g/dL — ABNORMAL LOW (ref 13.0–17.0)
MCH: 28.1 pg (ref 26.0–34.0)
MCHC: 33.1 g/dL (ref 30.0–36.0)
MCV: 85 fL (ref 80.0–100.0)
Platelets: 163 10*3/uL (ref 150–400)
RBC: 4.34 MIL/uL (ref 4.22–5.81)
RDW: 13.1 % (ref 11.5–15.5)
WBC: 4.2 10*3/uL (ref 4.0–10.5)
nRBC: 0 % (ref 0.0–0.2)

## 2021-03-10 LAB — BASIC METABOLIC PANEL
Anion gap: 6 (ref 5–15)
BUN: 13 mg/dL (ref 6–20)
CO2: 31 mmol/L (ref 22–32)
Calcium: 9.7 mg/dL (ref 8.9–10.3)
Chloride: 106 mmol/L (ref 98–111)
Creatinine, Ser: 1.69 mg/dL — ABNORMAL HIGH (ref 0.61–1.24)
GFR, Estimated: 46 mL/min — ABNORMAL LOW (ref >60)
Glucose, Bld: 95 mg/dL (ref 70–99)
Potassium: 3.6 mmol/L (ref 3.5–5.1)
Sodium: 143 mmol/L (ref 135–145)

## 2021-03-10 NOTE — Progress Notes (Signed)
Slept good. Incontinent of urine x1. Left WHO & left PRAFO applied at HS. BLE edema, left > right. Matthew Wise A

## 2021-03-10 NOTE — Progress Notes (Signed)
Patient ID: Matthew Wise, male   DOB: 10/21/1961, 60 y.o.   MRN: [id number]  Pt HH refferal sent to [place], [place]

## 2021-03-10 NOTE — Progress Notes (Signed)
PROGRESS NOTE   Subjective/Complaints: Up in bed working with SLP. No new complaints. Able to sleep. Emptying bowels and bladder  ROS: Patient denies fever, rash, sore throat, blurred vision, nausea, vomiting, diarrhea, cough, shortness of breath or chest pain, joint or back pain, headache, or mood change.   Objective:   No results found. Recent Labs    03/10/21 0655  WBC 4.2  HGB 12.2*  HCT 36.9*  PLT 163   Recent Labs    03/10/21 0655  NA 143  K 3.6  CL 106  CO2 31  GLUCOSE 95  BUN 13  CREATININE 1.69*  CALCIUM 9.7    Intake/Output Summary (Last 24 hours) at 03/10/2021 0927 Last data filed at 03/10/2021 0300 Gross per 24 hour  Intake 660 ml  Output 900 ml  Net -240 ml        Physical Exam: Vital Signs Blood pressure 123/66, pulse (!) 54, temperature 97.8 F (36.6 C), temperature source Oral, resp. rate 18, height 6' (1.829 m), weight 117.6 kg, SpO2 100 %.   Constitutional: No distress . Vital signs reviewed. HEENT: EOMI, oral membranes moist Neck: supple Cardiovascular: RRR without murmur. No JVD    Respiratory/Chest: CTA Bilaterally without wheezes or rales. Normal effort    GI/Abdomen: BS +, non-tender, non-distended Ext: no clubbing, cyanosis, or edema Psych: pleasant and cooperative Skin: No evidence of breakdown, no evidence of rash  Sensation absent LT on LUE and LLE  Motor:  RUE/RLE: 5/5 proximal to distal LUE/LLE: 3-/5 Left hip /knee ext  Tone - MAS 3/4 in L pec, L biceps. No change   Assessment/Plan: 1. Functional deficits which require 3+ hours per day of interdisciplinary therapy in a comprehensive inpatient rehab setting.  Physiatrist is providing close team supervision and 24 hour management of active medical problems listed below.  Physiatrist and rehab team continue to assess barriers to discharge/monitor patient progress toward functional and medical goals  Care  Tool:  Bathing    Body parts bathed by patient: Left arm,Chest,Abdomen,Right upper leg,Front perineal area,Left upper leg,Right lower leg,Face,Left lower leg   Body parts bathed by helper: Buttocks,Right arm     Bathing assist Assist Level: Moderate Assistance - Patient 50 - 74%     Upper Body Dressing/Undressing Upper body dressing   What is the patient wearing?: Pull over shirt    Upper body assist Assist Level: Minimal Assistance - Patient > 75%    Lower Body Dressing/Undressing Lower body dressing      What is the patient wearing?: Pants,Incontinence brief     Lower body assist Assist for lower body dressing: Moderate Assistance - Patient 50 - 74%     Toileting Toileting    Toileting assist Assist for toileting: Maximal Assistance - Patient 25 - 49%     Transfers Chair/bed transfer  Transfers assist     Chair/bed transfer assist level: Moderate Assistance - Patient 50 - 74%     Locomotion Ambulation   Ambulation assist   Ambulation activity did not occur: Safety/medical concerns  Assist level: 2 helpers Assistive device: Other (comment) (3 Musketeer) Max distance: 21ft   Walk 10 feet activity   Assist  Walk  10 feet activity did not occur: Safety/medical concerns  Assist level: 2 helpers Assistive device: Other (comment) (3 Musketeer)   Walk 50 feet activity   Assist Walk 50 feet with 2 turns activity did not occur: Safety/medical concerns         Walk 150 feet activity   Assist Walk 150 feet activity did not occur: Safety/medical concerns         Walk 10 feet on uneven surface  activity   Assist Walk 10 feet on uneven surfaces activity did not occur: Safety/medical concerns         Wheelchair     Assist Will patient use wheelchair at discharge?: Yes Type of Wheelchair: Manual Wheelchair activity did not occur: Safety/medical concerns  Wheelchair assist level: Contact Guard/Touching assist Max wheelchair  distance: 150    Wheelchair 50 feet with 2 turns activity    Assist        Assist Level: Contact Guard/Touching assist   Wheelchair 150 feet activity     Assist      Assist Level: Contact Guard/Touching assist   Blood pressure 123/66, pulse (!) 54, temperature 97.8 F (36.6 C), temperature source Oral, resp. rate 18, height 6' (1.829 m), weight 117.6 kg, SpO2 100 %.   Medical Problem List and Plan: 1.  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  Continue Pradaxa.              -antiplatelet therapy: Aspirin 81 mg daily 3. Pain Management: Continue Tylenol as needed  4/2-patient denies pain; continue regimen 4. Mood: Provide emotional support             -antipsychotic agents: N/A 5. Neuropsych: This patient is capable of making decisions on his own behalf. 6. Skin/Wound Care: Routine skin checks 7. Fluids/Electrolytes/Nutrition: Routine in and outs 8.  Seizure disorder.  Continue Keppra 500 mg twice daily  No breakthrough seizures 9.  Hypertension.    Lisinopril 40 mg daily, decreased to 20 on 3/28  Hydralazine 50 mg every 8 hours  Norvasc 10 mg daily.   Vitals:   03/09/21 1946 03/10/21 0352  BP: 130/70 123/66  Pulse: 62 (!) 54  Resp: 18 18  Temp: 98.2 F (36.8 C) 97.8 F (36.6 C)  SpO2: 100% 100%     Lisinopril decreased to 10mg  - bp's more robust 4/11  10.  Hypothyroidism.  Synthroid 11.  CKD stage III.  Baseline creatinine 1.83.  Creatinine 1.65 on 4/4 stable  12.  History of tobacco abuse.  Provide counseling 13.  Hyperlipidemia: Lipitor 14. 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  UA unremarkable, urine culture negative   15. Seasonal allergies: continue Flonase.  16. Post stroke spasticity:holding  Tizanidine due to drowsiness ,    ?botox as outpt  17.  Post stroke dysphagia  D3 thins, advance diet as tolerated 18. Constipation  +BM 4/10  LOS: 26 days A FACE TO FACE EVALUATION WAS PERFORMED  Meredith Staggers 03/10/2021, 9:27 AM
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
## 2021-03-10 NOTE — Progress Notes (Signed)
Speech Language Pathology Daily Session Note  Patient Details  Name: Matthew Wise MRN: [id number] Date of Birth: 1961/10/20  Today's Date: 03/10/2021 SLP Individual Time: 0830-0930 SLP Individual Time Calculation (min): 60 min  Short Term Goals: Week 4: SLP Short Term Goal 1 (Week 4): STG= LTG due to ELOS  Skilled Therapeutic Interventions: Skilled SLP intervention focused on cognition. Pt able to state he continues to have difficulty noticing when he adds to many pills into pill compartments during pill organization. He demonstrated good reasoning and understanding that he will need set up assistance and supervision when completing this task to avoid errors when taking medications. Pt completed deductive reasoning written puzzle with direction from SLP with min A verbal cues. Mod A to recall medications when given medication name. He required Supervision A with reading medication list to answer simple questions about medications he is taking. Cont with therapy per plan of care.Pt left seated upright in bed with bed alarm set and call bell within reach. Cont with therapy per plan of care.       Pain Pain Assessment Pain Scale: Faces Faces Pain Scale: No hurt  Therapy/Group: Individual Therapy  Darrol Poke Tyresha Fede 03/10/2021, 9:17 AM

## 2021-03-10 NOTE — Discharge Summary (Signed)
Physician Discharge Summary  Patient ID: Matthew Wise MRN: [id number] DOB/AGE: 30-Apr-1961 60 y.o.  Admit date: 02/12/2021 Discharge date: 03/12/2021  Discharge Diagnoses:  Principal Problem:   Right middle cerebral artery stroke [facility]) Active Problems:   Pressure injury of skin   Urinary frequency   Dysphagia, post-stroke   Spastic hemiplegia affecting nondominant side (HCC)   Drug-induced hypotension Seizure disorder Hypothyroidism CKD stage III History of tobacco use Hyperlipidemia History of pulmonary emboli DVT  Discharged Condition: Stable  Significant Diagnostic Studies: No results found.  Labs:  Basic Metabolic Panel: Recent Labs  Lab 03/10/21 0655  NA 143  K 3.6  CL 106  CO2 31  GLUCOSE 95  BUN 13  CREATININE 1.69*  CALCIUM 9.7    CBC: Recent Labs  Lab 03/10/21 0655  WBC 4.2  HGB 12.2*  HCT 36.9*  MCV 85.0  PLT 163    CBG: No results for input(s): GLUCAP in the last 168 hours.  Family history.  Father with COPD Brother with CVA as well as throat cancer and diabetes .  Mother with diabetes and CVA.  Negative for colon cancer  Brief HPI:   Matthew Wise is a 60 y.o. right-handed male with history of pulmonary emboli maintained on Eliquis, hypertension, CKD stage III, hyperlipidemia, prediabetes, sickle cell disease, tobacco abuse as well as history of right MCA territory infarction admission 01/21/2019 until 01/27/2021 and discharged home ambulating 150 feet rolling walker.  Patient lives alone 1 level home 3 steps to entry.  Independent with assistive device.  He has a sister who checks on him regularly.  Presented 02/03/2021 with left hemiparesis, subacute onset.  Cranial CT scan unremarkable for acute intracranial process.  Suspect acute infarct of the right frontal parietal lobe posterior superior to chronic infarct with probable involvement of lateral precentral and postcentral gyri.  Patient did not receive TPA.  CT angiogram of head and neck  showed abrupt occlusion of the right M1 segment new as compared to prior CTA of 01/25/2021.  Acute core infarct of the posterior right frontal parietal region largely corresponding with a completed infarct as seen on prior MRI.  EEG evidence of focal seizure arising from the right posterior quadrant during which patient was noted to have left upper extremity twitching.  Additionally there was cortical dysfunction in the right frontal temporal region likely due to underlying stroke.  Most recent echocardiogram with ejection fraction of 55 to 60%.  Admission chemistries unremarkable except creatinine 1.83 urine drug screen negative hemoglobin A1c 5.9 alcohol negative.  Patient underwent arteriogram with revascularization of right MCA M1 segment per interventional radiology.  Latest follow-up MRI showed large acute right MCA territory infarct with petechial hemorrhage and minimal leftward midline shift as well as a small acute left cerebellar infarct new small acute early subacute infarct in left occipital left parietal left frontal lobes.  Bilateral extremity Dopplers finding consistent with age-indeterminate DVT involving the right common femoral vein and right femoral vein as well as left femoral vein and left popliteal vein, left common femoral and SFA junction.  Patient was placed on intravenous heparin remained on low-dose aspirin and Pradaxa was added.  He had been loaded with Keppra for suspected seizure.  Hospital course further complicated by dysphagia currently on a mechanical soft diet.  Therapy evaluations completed due to patient's left-sided weakness he was admitted for a comprehensive rehab program.   Hospital Course: [name] was admitted to rehab 02/12/2021 for inpatient therapies to consist of PT,  ST and OT at least three hours five days a week. Past admission physiatrist, therapy team and rehab RN have worked together to provide customized collaborative inpatient rehab.  Pertaining to  patient's right MCA infarction likely atheroembolic status post thrombectomy as well as posterior frontal parietal region infarct remained stable he remained on low-dose aspirin as well as Pradaxa.  Noted hospital course findings of DVT history of pulmonary emboli in the setting of hypercoagulopathy disorder again continued Pradaxa.  Keppra ongoing for seizure disorder no further seizure activity.  Blood pressure controlled on lisinopril hydralazine as well as Norvasc and would need outpatient follow-up.  Blood pressures did remain somewhat soft.  Synthroid ongoing for hypothyroidism.  CKD stage III latest creatinine 1.65 and stable.  He did have a history of tobacco abuse receiving counsel regards to cessation of nicotine products.  Lipitor ongoing for hyperlipidemia.  He did have some initial urinary frequency incontinence felt to be possibly spastic bladder post CVA started on Flomax urine culture negative he was doing much better with routine toileting.  Post stroke spasticity as noted Ty as a Dean discontinued due to drowsiness there was some suspect need for possible Botox which could be addressed outpatient.  He was tolerating mechanical soft diet.   Blood pressures were monitored on TID basis and soft and monitored     Rehab course: During patient's stay in rehab weekly team conferences were held to monitor patient's progress, set goals and discuss barriers to discharge. At admission, patient required +2 physical assist sit to stand moderate assist side-lying to sitting supervision for rolling moderate assist upper body bathing max is lower body bathing mod assist lower body dressing max is lower body dressing  Physical exam.  Blood pressure 149/76 pulse 65 temperature 98.4 respirations 18 oxygen saturations 96% room air Constitutional.  No acute distress HEENT Head.  Normocephalic and atraumatic Eyes.  Pupils round and reactive to light no discharge.nystagmus Neck.  Supple nontender no JVD  without thyromegaly Cardiac regular rate rhythm without extra sounds or murmur heard Abdomen.  Soft nontender positive bowel sounds without rebound Respiratory effort normal no respiratory distress without wheeze Neurologic.  Alert oriented makes eye contact with examiner follows commands. Motor.  Right upper extremity/right lower extremity 5/5 proximal to distal Left upper extremity 0/5 Left lower extremity hip flexion knee extension 3 -/5, ankle dorsiflexion 2/5.  Sensation intact  He/She  has had improvement in activity tolerance, balance, postural control as well as ability to compensate for deficits. He/She has had improvement in functional use RUE/LUE  and RLE/LLE as well as improvement in awareness.  Patient continues demonstrate deficits in muscle weakness and muscle tightness.  Max assist lower body dressing included TED hose and socks, mod assist supine to sit with head of bed slightly elevated max assist squat pivot to wheelchair.  Propels wheelchair right hemitechnique contact-guard assist.  Consistent verbal cues to attend to task and scan to the left to avoid obstacles.  Ongoing family teaching discussed the use of gait belt and safety issues.  Also reviewed the patient not safe to ambulate with family at home so plan was to discharge at a wheelchair level.  Sister able to perform both lateral scoot and squat pivot transfers leading right and left.  Currently needs min assist for upper body self-care and moderate demonstration of cueing for sequencing.  Lower body self-care is a moderate assist level sit to stand following hemitechnique.  Transfers are currently minimal assist squat pivot to the right and  minimal to mod to the left.  Stand pivot transfers are max assist level stand pivot however.  Patient required extra processing time to locate information on simulated medication labels and minimal cues to answer questions for 80% accuracy.  He answered basic level questions based on printed  instructions.  Again it was stressed to family after family teaching completed the need for assistance for safety and was discharged to home.       Disposition: Discharged to home    Diet: Regular  Special Instructions: No driving smoking or alcohol  Medications at discharge 1.  Tylenol as needed 2.  Norvasc 10 mg p.o. daily 3.  Aspirin 81 mg p.o. daily 4.  Lipitor 40 mg p.o. daily 5.  Pradaxa 150 mg p.o. every 12 hours 6.  Colace 100 mg p.o. daily 7.  Hydralazine 50 mg p.o. every 8 hours 8.  Keppra 500 mg p.o. twice daily 9.  Synthroid 25 mcg p.o. daily 10.  Lisinopril 20 mg p.o. daily 11.  Provigil 200 mg p.o. daily 12.  Protonix 40 mg p.o. nightly 13.  Flomax 0.4 mg daily after supper  30-35 minutes were spent completing discharge summary and discharge planning  Discharge Instructions    Ambulatory referral to Neurology   Complete by: As directed    An appointment is requested in approximately 4 weeks right MCA infarction status post thrombectomy   Ambulatory referral to Physical Medicine Rehab   Complete by: As directed    Ambulatory referral to Physical Medicine Rehab   Complete by: As directed    Moderate complexity follow-up 1 to 2 weeks right MCA infarction       Follow-up Information    Kirsteins, Luanna Salk, MD Follow up.   Specialty: Physical Medicine and Rehabilitation Why: Office to call for appointment Contact information: [place] [address] [phone]               Signed: Cathlyn Parsons 03/11/2021, 5:13 AM

## 2021-03-10 NOTE — Progress Notes (Signed)
Patient ID: Matthew Wise, male   DOB: Nov 17, 1961, 60 y.o.   MRN: [id number]   Hospital Bed, Drop Arm and Rolling Walker ordered through [employer].  [place], [place]

## 2021-03-10 NOTE — Progress Notes (Addendum)
Occupational Therapy Session Note  Patient Details  Name: Matthew Wise MRN: [id number] Date of Birth: 15-Jan-1961  Today's Date: 03/10/2021 OT Individual Time: 8250-5397 OT Individual Time Calculation (min): 71 min    Short Term Goals: Week 4:  OT Short Term Goal 1 (Week 4): Continue working on established LTGs set at min assist level.  Skilled Therapeutic Interventions/Progress Updates:    Pt completed shower and dressing during session.  Charlaine Dalton was utilized for sit to stand with mod assist and for transfer into the shower from the EOB.  Mod assist for supine to sit to achieve this position.  Once in the shower he needed mod instructional cueing for initiation of bathing tasks as he would just sit and let the water run on him otherwise.  Max hand over hand assist needed for integration of the LUE for washing the right arm.  He then needed mod assist for sit to stand on two occasions to complete washing buttocks and rinsing it thoroughly.  He was able to complete transfer to the wheelchair squat pivot with mod assist to the right.  UB dressing with min assist following hemi techniques with LB dressing at mod assist to donn brief and pants.  Max assist was needed for TEDs and shoes.  He finished ADL tasks with oral hygiene from seated position with supervision.  Finished session with completion of NMES to the left forearm for facilitation of digit extension.  He was able to tolerate 7 mins of stimulation on level 9 intensity without any adverse reactions.  Parameters listed below.  Finished session with pt sitting up in the wheelchair with the call button and phone in reach and safety alarm belt in place.  No family present for education this session.    Saebo Stim One 330 pulse width 35 Hz pulse rate On 8 sec/ off 8 sec Ramp up/ down 2 sec Symmetrical Biphasic wave form  Max intensity 11mA at 500 Ohm load   Therapy Documentation Precautions:  Precautions Precautions: Fall,Other  (comment) Precaution Comments: L hemiplegia, left neglect, hx of seizures, age indeterminate B LE DVTs Restrictions Weight Bearing Restrictions: No  Pain: Pain Assessment Pain Scale: Faces Pain Score: 0-No pain Faces Pain Scale: No hurt ADL: See Care Tool Section for some details of mobility and selfcare  Therapy/Group: Individual Therapy  Liticia Gasior,Jovanny OTR/L 03/10/2021, 12:15 PM

## 2021-03-11 MED ORDER — DABIGATRAN ETEXILATE MESYLATE 150 MG PO CAPS: 150.0000 mg | ORAL_CAPSULE | Freq: Two times a day (BID) | ORAL | 1 refills | Status: DC

## 2021-03-11 MED ORDER — LEVOTHYROXINE SODIUM 25 MCG PO TABS: 25.0000 ug | ORAL_TABLET | Freq: Every day | ORAL | 0 refills | Status: DC

## 2021-03-11 MED ORDER — ATORVASTATIN CALCIUM 40 MG PO TABS: 1.0000 | ORAL_TABLET | Freq: Every day | ORAL | 0 refills | Status: DC

## 2021-03-11 MED ORDER — AMLODIPINE BESYLATE 10 MG PO TABS: 10.0000 mg | ORAL_TABLET | Freq: Every day | ORAL | 0 refills | Status: DC

## 2021-03-11 MED ORDER — LEVETIRACETAM 500 MG PO TABS: 500.0000 mg | ORAL_TABLET | Freq: Two times a day (BID) | ORAL | 0 refills | Status: DC

## 2021-03-11 MED ORDER — PANTOPRAZOLE SODIUM 40 MG PO TBEC: 40.0000 mg | DELAYED_RELEASE_TABLET | Freq: Every day | ORAL | 0 refills | Status: DC

## 2021-03-11 MED ORDER — MODAFINIL 200 MG PO TABS: 200.0000 mg | ORAL_TABLET | Freq: Every day | ORAL | 0 refills | Status: DC

## 2021-03-11 MED ORDER — ACETAMINOPHEN 325 MG PO TABS: 650.0000 mg | ORAL_TABLET | ORAL | Status: DC | PRN

## 2021-03-11 MED ORDER — HYDRALAZINE HCL 50 MG PO TABS: 50.0000 mg | ORAL_TABLET | Freq: Three times a day (TID) | ORAL | 0 refills | Status: DC

## 2021-03-11 MED ORDER — TAMSULOSIN HCL 0.4 MG PO CAPS: 0.4000 mg | ORAL_CAPSULE | Freq: Every day | ORAL | 0 refills | Status: DC

## 2021-03-11 MED ORDER — LISINOPRIL 20 MG PO TABS: 20.0000 mg | ORAL_TABLET | Freq: Every day | ORAL | 0 refills | Status: DC

## 2021-03-11 NOTE — Progress Notes (Signed)
Speech Language Pathology Discharge Summary  Patient Details  Name: Matthew Wise MRN: [id number] Date of Birth: 1961-03-22  Today's Date: 03/11/2021 SLP Individual Time: 0815-0915     Skilled Therapeutic Interventions:  Skilled SLP intervention focused on cognition and discharge education. Pt education on strategies and external aids to increase recall for doctors appointments and home health therapy. Pt verbalized understanding for information. He completed semi complex written problem solving task with time calculations with min A verbal cues. Pt going home tomorrow. Dc from Skilled SLP intervention.     Patient has met 5 of 5 long term goals.  Patient to discharge at overall Supervision level.       Clinical Impression/Discharge Summary:   Pt has made consistent gains and has met 5 of 5 LTG's this admission due to improved attention, problem solving, left inattention, and safety awareness. Pt currently requires supervision A for cognitive tasks for initiation and attention. He is tolerating a regular diet and thin liquids with no overt s/sx of aspiration or penetration. Pt/family education completed and pt will discharge home with 24 hour supervision from family, friends, etc. Pt would benefit from home health SLP services to maximize safety and independence following discharge home.  Care Partner:  Caregiver Able to Provide Assistance: Yes     Recommendation:  Home Health SLP;24 hour supervision/assistance      Equipment: none at this time   Reasons for discharge: Discharged from hospital        [place] 03/11/2021, 9:02 AM

## 2021-03-11 NOTE — Progress Notes (Signed)
Physical Therapy Discharge Summary  Patient Details  Name: Matthew Wise MRN: [id number] Date of Birth: 1961/01/17  Today's Date: 03/11/2021 PT Individual Time: 6789-3810 PT Individual Time Calculation (min): 68 min    Patient has met 8 of 8 long term goals due to improved activity tolerance, improved balance, improved postural control, increased strength, ability to compensate for deficits, functional use of  left upper extremity and left lower extremity, improved attention, improved awareness and improved coordination.  Patient to discharge at a wheelchair level with CGA, and Mod Assist for transfers. Patient's care partner requires assistance to provide the necessary physical assistance at discharge.  Reasons goals not met: N/A  Recommendation:  Patient will benefit from ongoing skilled PT services in home health setting to continue to advance safe functional mobility, address ongoing impairments in transfers, trunk control, gait, increased LLE ER tone, L inattention, and minimize fall risk.  Equipment: HH PT, RW, custom wc, hospital bed  Reasons for discharge: treatment goals met and discharge from hospital  Patient/family agrees with progress made and goals achieved: Yes   Skilled Therapeutic Interventions/Progress Updates: Pt received supine in bed. Did not report any pain and agreeable to therapy. Therapist discussed home d/c location and transportation; pt verbalizes that he will likely transport to his sister's home via ambulance to bypass stairs needed to enter home due to safety concerns. Pt transferred to sitting EOB min assist. Transferred R to WC squat pivot min assist; pt educated to assist as much as possible during transfers when at home. Shoes donned max assist. Wheeled to therapy gym for time management. Transfer into car (sedan height) performed mod assist stand pivot +2. Transfer out of car in [facility] performed squat pivot mod assist +2. Pt practiced this transfer with  family and verbalizes that he "feels comfortable with it" and "it is easier to get out of the car than into it." However, with pt's real car, enough space may not be available for 2 assistants during transfer, and pt will benefit from addressing this during [facility] therapy. Pt agreeable to recommendation of ambulance transport home. Orthotist met in therapy gym with night splint ready for pt use. Education on use of night splint during night to assist with increasing L ankle DF; education also provided to have family perform full skin check each morning when removing night splint. Pt wheeled to room for time management. Transferred L to bed squat pivot min assist with therapist facilitating WB through LLE to maintain neutral position. Education on sitting L hip ER stretch. Transferred to supine min assist; pt able to lift LLE majority of the way. Pt able to perform L heel slides in supine; educated to perform this exercise at home (with family member holding LLE in neutral) to facilitate L hip and knee flexion return. Therapist asked pt if he had any additional concerns/questions; pt did not report any. Pt left in bed with pillow under L arm for flexor tone management and towels on lateral side of L knee and ankle for L hip ER management; education to do both of these interventions at home as well. Night splint left on; RN notified to doff night splint and assess skin integrity before dinner today. Bed alarm turned on and needs within reach.   PT Discharge Precautions/Restrictions Precautions Precautions: Fall;Other (comment) Precaution Comments: L hemiplegia, left neglect, hx of seizures, age indeterminate B LE DVTs Restrictions Weight Bearing Restrictions: No Pain Pain Assessment Pain Scale: 0-10 Pain Score: 0-No pain Perception  Perception Perception: Impaired  Inattention/Neglect: Does not attend to left visual field;Does not attend to left side of body Praxis Praxis: Impaired Praxis Impairment  Details: Initiation;Motor planning  Cognition Overall Cognitive Status: Difficult to assess Arousal/Alertness: Awake/alert Orientation Level: Oriented X4 Attention: Sustained Selective Attention: Impaired Awareness: Impaired Awareness Impairment: Anticipatory impairment Problem Solving: Impaired Initiating: Impaired Safety/Judgment: Impaired Sensation Sensation Light Touch: Impaired by gross assessment Light Touch Impaired Details: Impaired LUE;Impaired LLE Hot/Cold: Not tested Stereognosis: Not tested Coordination Gross Motor Movements are Fluid and Coordinated: No Fine Motor Movements are Fluid and Coordinated: No Motor  Motor Motor: Hemiplegia;Abnormal postural alignment and control;Abnormal tone Motor - Discharge Observations: LLE hemiparesis still present, although has gained considerable movement from initial evaluation  Mobility Bed Mobility Bed Mobility: Supine to Sit;Sit to Supine Supine to Sit: Minimal Assistance - Patient > 75% Sit to Supine: Minimal Assistance - Patient > 75% Transfers Transfers: Sit to Stand;Stand to Sit;Squat Pivot Transfers;Stand Pivot Transfers Sit to Stand: Minimal Assistance - Patient > 75% Stand to Sit: Minimal Assistance - Patient > 75% Stand Pivot Transfers: Moderate Assistance - Patient 50 - 74%;2 Helpers Stand Pivot Transfer Details: Tactile cues for sequencing;Tactile cues for initiation;Tactile cues for posture;Tactile cues for weight beaing;Tactile cues for placement;Tactile cues for weight shifting;Verbal cues for sequencing;Verbal cues for technique;Manual facilitation for placement Squat Pivot Transfers: Moderate Assistance - Patient 50-74% Transfer (Assistive device): None Locomotion  Gait Ambulation: Yes Gait Assistance: 2 Helpers;Maximal Assistance - Patient 25-49% Gait Distance (Feet): 24 Feet Assistive device: None Gait Assistance Details: Manual facilitation for placement;Manual facilitation for weight shifting;Manual  facilitation for weight bearing;Verbal cues for gait pattern;Verbal cues for technique;Tactile cues for placement;Tactile cues for weight shifting;Tactile cues for initiation;Tactile cues for posture;Tactile cues for weight beaing Gait Gait: Yes Gait Pattern: Impaired Gait Pattern: Decreased hip/knee flexion - left (Performed 3 muskateer during therapy) Stairs / Additional Locomotion Stairs: No Architect: Yes Wheelchair Assistance: Development worker, international aid: Right lower extremity;Right upper extremity Wheelchair Parts Management: Needs assistance Distance: 150  Trunk/Postural Assessment  Cervical Assessment Cervical Assessment: Exceptions to [facility] (Fwd head with resting rotation to R) Thoracic Assessment Thoracic Assessment: Exceptions to [facility] (Kyphotic) Lumbar Assessment Lumbar Assessment: Exceptions to [facility] (Posterior pelvic tilt) Postural Control Trunk Control: L lean in standing  Balance Balance Balance Assessed: Yes Static Sitting Balance Static Sitting - Balance Support: Feet supported Static Sitting - Level of Assistance: 5: Stand by assistance Dynamic Sitting Balance Dynamic Sitting - Balance Support: During functional activity Dynamic Sitting - Level of Assistance:  (CGA) Static Standing Balance Static Standing - Balance Support: During functional activity Static Standing - Level of Assistance: 4: Min assist Dynamic Standing Balance Dynamic Standing - Balance Support: During functional activity Dynamic Standing - Level of Assistance: 3: Mod assist Extremity Assessment  RLE Assessment RLE Assessment: Within Functional Limits LLE Assessment LLE Assessment: Exceptions to [facility] Passive Range of Motion (PROM) Comments: DF tightness still present (night splint given to address this); L hip ER at rest due to tightness Active Range of Motion (AROM) Comments: Ankle motion not present; knee and hip flex present General  Strength Comments: Hip and knee flex against gravity through limited ROM in supine and sitting. Knee ext present allowing standing. Able to hold bridge in supine for ~5sec before L hip ext fatigues.    Eleonore Chiquito, SPT 03/11/2021, 5:45 PM

## 2021-03-11 NOTE — Progress Notes (Signed)
Patient ID: Matthew Wise, male   DOB: 01/16/1961, 60 y.o.   MRN: [id number]   Pt court documentation, handicapped placement card application and script for ramp had been provided  [name], [place]

## 2021-03-11 NOTE — Progress Notes (Signed)
Patient ID: Matthew Wise, male   DOB: 1960-12-17, 60 y.o.   MRN: [id number]   Referral sent to [place], [place]

## 2021-03-11 NOTE — Progress Notes (Signed)
Occupational Therapy Discharge Summary  Patient Details  Name: Matthew Wise MRN: [id number] Date of Birth: 04/22/1961  Today's Date: 03/11/2021 OT Individual Time: 1497-0263 OT Individual Time Calculation (min): 69 min   Session Note:  Pt in bed to start session with his sister and niece in for family education.  He was able to complete supine to sit with mod assist and then transfer to the wheelchair on the right side at min assist level squat pivot.  He then worked on washing his peri area in standing at the sink and donning a new brief, pants, and pullover shirt.  Educated family on use of the RW for sit to stand and standing in order to assist with washing front and back peri area as well as for managing LB clothing with dressing or toileting tasks.  His sister was able to assist with standing while pulling up brief and pants, however she reports that she would have to have a second person to assist with this as she would not be able to complete it on her own.  He was able to donn his pants and brief at mod assist following hemi technique with min assist for donning pullover shirt as well.  After completion of dressing, he voiced the need to toilet.  Mod assist squat pivot transfer done to the drop arm commode over the toilet with max assist for toilet hygiene and clothing management.  Pt's sister or niece did not assist with this secondary to time.  Once complete, pt was left sitting up in the wheelchair with the call button and phone in reach.  Per discussion with his sister and niece, pt's other sister has experience with transfers and has been in for education this week.  They are dependent on her initially to provide direction and assist with Mr. Ran until [facility] continues progression and education.  Encouraged them to work together when possible to help ease burden and keep him safe.   Patient has met 9 of 13 long term goals due to improved activity tolerance, improved balance, postural  control, ability to compensate for deficits, functional use of  LEFT upper and LEFT lower extremity, improved attention, improved awareness and improved coordination.  Patient to discharge at overall Mod Assist level.  Patient's care partner requires assistance to provide the necessary physical and cognitive assistance at discharge.    Reasons goals not met: Pt needs mod assist for sit to stand, max assist for toileting tasks with min assist for toilet transfers.   Recommendation:  Patient will benefit from ongoing skilled OT services in home health setting to continue to advance functional skills in the area of BADL and Reduce care partner burden.  Pt still demonstrates significant deficits with LUE and LLE weakness as well as decreased balance, decreased attention, decreased awareness, left visual field deficit, and left neglect.  Feel he will benefit from continued HHOT to further progress selfcare independence to a min assist- supervsion level for reduced caregiver burden.      Equipment: drop arm commode, wheelchair, RW  Reasons for discharge: treatment goals met and discharge from hospital  Patient/family agrees with progress made and goals achieved: Yes  OT Discharge Precautions/Restrictions  Precautions Precautions: Fall;Other (comment) Precaution Comments: L hemiplegia, left neglect, hx of seizures, age indeterminate B LE DVTs Restrictions Weight Bearing Restrictions: No   Pain  No report of pain during session  ADL ADL Eating: Supervision/safety Where Assessed-Eating: Wheelchair Grooming: Supervision/safety Where Assessed-Grooming: Wheelchair Upper Body Bathing: Minimal assistance  Where Assessed-Upper Body Bathing: Chair,Shower Lower Body Bathing: Moderate assistance Where Assessed-Lower Body Bathing: Shower Upper Body Dressing: Minimal assistance Where Assessed-Upper Body Dressing: Chair Lower Body Dressing: Moderate assistance Where Assessed-Lower Body Dressing:  Wheelchair,Sitting at sink,Standing at sink Toileting: Maximal assistance Where Assessed-Toileting: Recruitment consultant Transfer: Moderate assistance Toilet Transfer Method: Squat pivot Toilet Transfer Equipment: Drop arm bedside commode Tub/Shower Transfer: Moderate assistance Tub/Shower Transfer Method: Squat pivot Tub/Shower Equipment: Facilities manager: Maximal Firefighter Method: Radiographer, therapeutic: Gaffer Baseline Vision/History: Wears glasses Wears Glasses: Reading only Patient Visual Report: No change from baseline Vision Assessment?: Yes Eye Alignment: Within Functional Limits Ocular Range of Motion: Within Functional Limits Alignment/Gaze Preference: Head turned (right head turn) Visual Fields: Left homonymous hemianopsia Perception  Perception: Impaired Inattention/Neglect: Does not attend to left visual field;Does not attend to left side of body Praxis Praxis: Impaired Praxis Impairment Details: Initiation Praxis-Other Comments: motor impersistence Cognition Overall Cognitive Status: Impaired/Different from baseline Arousal/Alertness: Awake/alert Attention: Focused;Selective Focused Attention: Appears intact Sustained Attention: Impaired Sustained Attention Impairment: Functional basic Selective Attention: Impaired Selective Attention Impairment: Functional basic Memory: Impaired Memory Impairment: Decreased recall of new information;Storage deficit Awareness Impairment: Anticipatory impairment Problem Solving: Impaired Safety/Judgment: Impaired Sensation Sensation Light Touch: Impaired Detail Light Touch Impaired Details: Impaired LUE;Impaired LLE Hot/Cold: Not tested Proprioception: Impaired Detail Proprioception Impaired Details: Impaired LUE Stereognosis: Not tested Additional Comments: Pt still with decreased light touch and proprioception in the  LUE. Coordination Gross Motor Movements are Fluid and Coordinated: No Fine Motor Movements are Fluid and Coordinated: No Coordination and Movement Description: Pt with max hand over hand needed for integration of the LUE into selfcare tasks.  Movement at [facility] stage II-III in the left arm and stage II in the hand. Motor  Motor Motor: Hemiplegia;Abnormal postural alignment and control;Abnormal tone Motor - Discharge Observations: Still with LUE and LLE hemiparesis with increased tone in the left elbow, digits, and shoulder adductors. Mobility  Bed Mobility Bed Mobility: Supine to Sit;Sit to Supine Supine to Sit: Moderate Assistance - Patient 50-74% Sit to Supine: Moderate Assistance - Patient 50-74% Transfers Sit to Stand: Moderate Assistance - Patient 50-74% Stand to Sit: Minimal Assistance - Patient > 75%  Trunk/Postural Assessment  Cervical Assessment Cervical Assessment: Exceptions to [facility] (head flexion and cervical rotation to the right at rest) Thoracic Assessment Thoracic Assessment: Exceptions to [facility] (thoracic rounding) Lumbar Assessment Lumbar Assessment: Exceptions to [facility] (posterior pelvic tilt at rest)  Balance Balance Balance Assessed: Yes Static Sitting Balance Static Sitting - Balance Support: Feet supported Static Sitting - Level of Assistance: 5: Stand by assistance Dynamic Sitting Balance Dynamic Sitting - Balance Support: During functional activity Dynamic Sitting - Level of Assistance:  (contact guard) Static Standing Balance Static Standing - Balance Support: During functional activity;Right upper extremity supported Static Standing - Level of Assistance: 4: Min assist Dynamic Standing Balance Dynamic Standing - Balance Support: During functional activity;Right upper extremity supported;Left upper extremity supported Dynamic Standing - Level of Assistance: 3: Mod assist Extremity/Trunk Assessment RUE Assessment RUE Assessment: Within Functional  Limits LUE Assessment LUE Assessment: Exceptions to [facility] Passive Range of Motion (PROM) Comments: increased tone noted in the pec, elbow flexors, and digit flexors at 2/4 on the Modified [place] Scale. General Strength Comments: Brunnstrum stage II-III movement in the arm with some active internal rotation and adduction noted in the arm as well as shoulder flexion and triceps extension for pre-functional reaching.  Trace digit flexion  noted with no active extension.  He needs max hand over hand for integration into selfcare tasks.   Taylyn Brame,Jeryl OTR/L 03/11/2021, 5:33 PM

## 2021-03-11 NOTE — Progress Notes (Signed)
Orthopedic Tech Progress Note Patient Details:  Matthew Wise 1961/09/23 [id number] Called in order to [facility] for an "OTHER SPLINT".  Patient ID: Matthew Wise, male   DOB: [birth year], 60 y.o.   MRN: [id number]   Janit Pagan 03/11/2021, 4:28 PM

## 2021-03-11 NOTE — Progress Notes (Signed)
+/-   sleep. Offered PRN trazodone, patient declined. Patient reports he normally stays up late and sleeps late. Incontinent and continent of B&B. LBM 03/09/21-small. Will give PRN LOC this AM. Reluctant to take, afraid of being incontinent. Left inattention, loses phone & call light on left side of bed. BLE edema, left > right. Left PRAFO and Left WHO at HS. Denies pain. Talked with patient about depression, he denies feeling depressed. Needs a BP machine at home. [name]

## 2021-03-11 NOTE — Progress Notes (Signed)
PROGRESS NOTE   Subjective/Complaints:   ROS: Patient denies CP, SOB, N/V/D    Objective:   No results found. Recent Labs    03/10/21 0655  WBC 4.2  HGB 12.2*  HCT 36.9*  PLT 163   Recent Labs    03/10/21 0655  NA 143  K 3.6  CL 106  CO2 31  GLUCOSE 95  BUN 13  CREATININE 1.69*  CALCIUM 9.7    Intake/Output Summary (Last 24 hours) at 03/11/2021 0820 Last data filed at 03/11/2021 0230 Gross per 24 hour  Intake 900 ml  Output 1125 ml  Net -225 ml        Physical Exam: Vital Signs Blood pressure 112/65, pulse 62, temperature 98.4 F (36.9 C), temperature source Oral, resp. rate 18, height 6' (1.829 m), weight 117.6 kg, SpO2 100 %.  General: No acute distress Mood and affect are appropriate Heart: Regular rate and rhythm no rubs murmurs or extra sounds Lungs: Clear to auscultation, breathing unlabored, no rales or wheezes Abdomen: Positive bowel sounds, soft nontender to palpation, nondistended Extremities: No clubbing, cyanosis, or edema Skin: No evidence of breakdown, no evidence of rash  Sensation absent LT on LUE and LLE  Motor:  RUE/RLE: 5/5 proximal to distal LUE/LLE: 3-/5 Left hip /knee ext  Tone - MAS 3/4 in L pec, L biceps. No change   Assessment/Plan: 1. Functional deficits which require 3+ hours per day of interdisciplinary therapy in a comprehensive inpatient rehab setting.  Physiatrist is providing close team supervision and 24 hour management of active medical problems listed below.  Physiatrist and rehab team continue to assess barriers to discharge/monitor patient progress toward functional and medical goals  Care Tool:  Bathing    Body parts bathed by patient: Left arm,Chest,Abdomen,Right upper leg,Front perineal area,Left upper leg,Right lower leg,Face,Left lower leg   Body parts bathed by helper: Buttocks,Right arm,Left lower leg     Bathing assist Assist Level: Moderate  Assistance - Patient 50 - 74%     Upper Body Dressing/Undressing Upper body dressing   What is the patient wearing?: Pull over shirt    Upper body assist Assist Level: Minimal Assistance - Patient > 75%    Lower Body Dressing/Undressing Lower body dressing      What is the patient wearing?: Pants,Incontinence brief     Lower body assist Assist for lower body dressing: Moderate Assistance - Patient 50 - 74%     Toileting Toileting    Toileting assist Assist for toileting: Maximal Assistance - Patient 25 - 49%     Transfers Chair/bed transfer  Transfers assist     Chair/bed transfer assist level: Moderate Assistance - Patient 50 - 74%     Locomotion Ambulation   Ambulation assist   Ambulation activity did not occur: Safety/medical concerns  Assist level: 2 helpers Assistive device: Other (comment) (3 Musketeer) Max distance: 31ft   Walk 10 feet activity   Assist  Walk 10 feet activity did not occur: Safety/medical concerns  Assist level: 2 helpers Assistive device: Other (comment) (3 Musketeer)   Walk 50 feet activity   Assist Walk 50 feet with 2 turns activity did not occur: Safety/medical concerns  Walk 150 feet activity   Assist Walk 150 feet activity did not occur: Safety/medical concerns         Walk 10 feet on uneven surface  activity   Assist Walk 10 feet on uneven surfaces activity did not occur: Safety/medical concerns         Wheelchair     Assist Will patient use wheelchair at discharge?: Yes Type of Wheelchair: Manual Wheelchair activity did not occur: Safety/medical concerns  Wheelchair assist level: Contact Guard/Touching assist Max wheelchair distance: 150    Wheelchair 50 feet with 2 turns activity    Assist        Assist Level: Contact Guard/Touching assist   Wheelchair 150 feet activity     Assist      Assist Level: Contact Guard/Touching assist   Blood pressure 112/65, pulse  62, temperature 98.4 F (36.9 C), temperature source Oral, resp. rate 18, height 6' (1.829 m), weight 117.6 kg, SpO2 100 %.   Medical Problem List and Plan: 1.  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  Continue Pradaxa.              -antiplatelet therapy: Aspirin 81 mg daily 3. Pain Management: Continue Tylenol as needed  4/2-patient denies pain; continue regimen 4. Mood: Provide emotional support             -antipsychotic agents: N/A 5. Neuropsych: This patient is capable of making decisions on his own behalf. 6. Skin/Wound Care: Routine skin checks 7. Fluids/Electrolytes/Nutrition: Routine in and outs 8.  Seizure disorder.  Continue Keppra 500 mg twice daily  No breakthrough seizures 9.  Hypertension.    Lisinopril 40 mg daily, decreased to 20 on 3/28  Hydralazine 50 mg every 8 hours  Norvasc 10 mg daily.   Vitals:   03/11/21 0340 03/11/21 0750  BP: 109/60 112/65  Pulse: 61 62  Resp: 18   Temp: 98.4 F (36.9 C)   SpO2: 97% 100%     Lisinopril decreased to 10mg  - bp's more robust 4/11  10.  Hypothyroidism.  Synthroid 11.  CKD stage III.  Baseline creatinine 1.83.  Creatinine 1.65 on 4/4 stable  12.  History of tobacco abuse.  Provide counseling 13.  Hyperlipidemia: Lipitor 14. 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  UA unremarkable, urine culture negative   15. Seasonal allergies: continue Flonase.  16. Post stroke spasticity:holding  Tizanidine due to drowsiness ,    ?botox as outpt  17.  Post stroke dysphagia  D3 thins, advance diet as tolerated 18. Constipation  +BM 4/10  LOS: 27 days A FACE TO FACE EVALUATION WAS  PERFORMED  Charlett Blake 03/11/2021, 8:20 AM

## 2021-03-11 NOTE — Progress Notes (Signed)
Patient ID: Matthew Wise, male   DOB: 14-Mar-1961, 60 y.o.   MRN: [id number]  Pt approved by [facility] for [facility], [place]

## 2021-03-11 NOTE — Discharge Instructions (Signed)
Inpatient Rehab Discharge Instructions  Matthew Wise Discharge date and time: No discharge date for patient encounter.   Activities/Precautions/ Functional Status: Activity: activity as tolerated Diet: regular diet Wound Care: Routine skin checks Functional status:  ___ No restrictions     ___ Walk up steps independently ___ 24/7 supervision/assistance   ___ Walk up steps with assistance ___ Intermittent supervision/assistance  ___ Bathe/dress independently ___ Walk with walker     _x__ Bathe/dress with assistance ___ Walk Independently    ___ Shower independently ___ Walk with assistance    ___ Shower with assistance ___ No alcohol     ___ Return to work/school ________  Special Instructions:  COMMUNITY REFERRALS UPON DISCHARGE:    Home Health:   PT     OT     ST                     Agency: [facility]  Phone: [phone]    Medical Equipment/Items Ordered: Hospital Bed, Rolling Walker, Drop Arm Commode, Custom [employer]                                                 Agency/Supplier: [employer]    No driving smoking or alcohol STROKE/TIA DISCHARGE INSTRUCTIONS SMOKING Cigarette smoking nearly doubles your risk of having a stroke & is the single most alterable risk factor  If you smoke or have smoked in the last 12 months, you are advised to quit smoking for your health.  Most of the excess cardiovascular risk related to smoking disappears within a year of stopping.  Ask you doctor about anti-smoking medications  [place] Quit Line: 1-800-QUIT NOW  Free Smoking Cessation Classes [phone]  CHOLESTEROL Know your levels; limit fat & cholesterol in your diet  Lipid Panel     Component Value Date/Time   CHOL 109 02/03/2021 0222   TRIG 141 02/03/2021 0222   HDL 56 02/03/2021 0222   CHOLHDL 1.9 02/03/2021 0222   VLDL 28 02/03/2021 0222   LDLCALC 25 02/03/2021 0222      Many patients benefit from treatment even if their cholesterol is at goal.  Goal:  Total Cholesterol (CHOL) less than 160  Goal:  Triglycerides (TRIG) less than 150  Goal:  HDL greater than 40  Goal:  LDL (LDLCALC) less than 100   BLOOD PRESSURE American Stroke Association blood pressure target is less that 120/80 mm/Hg  Your discharge blood pressure is:     Monitor your blood pressure  Limit your salt and alcohol intake  Many individuals will require more than one medication for high blood pressure  DIABETES (A1c is a blood sugar average for last 3 months) Goal HGBA1c is under 7% (HBGA1c is blood sugar average for last 3 months)  Diabetes: No known diagnosis of diabetes    Lab Results  Component Value Date   HGBA1C 5.9 (H) 02/03/2021     Your HGBA1c can be lowered with medications, healthy diet, and exercise.  Check your blood sugar as directed by your physician  Call your physician if you experience unexplained or low blood sugars.  PHYSICAL ACTIVITY/REHABILITATION Goal is 30 minutes at least 4 days per week  Activity: Increase activity slowly, Therapies: Physical Therapy: Home Health Return to work:   Activity decreases your risk of heart attack and stroke and makes your  heart stronger.  It helps control your weight and blood pressure; helps you relax and can improve your mood.  Participate in a regular exercise program.  Talk with your doctor about the best form of exercise for you (dancing, walking, swimming, cycling).  DIET/WEIGHT Goal is to maintain a healthy weight  Your discharge diet is:  Diet Order    None      liquids Your height is:    Your current weight is:   Your Body Mass Index (BMI) is:     Following the type of diet specifically designed for you will help prevent another stroke.  Your goal weight range is:    Your goal Body Mass Index (BMI) is 19-24.  Healthy food habits can help reduce 3 risk factors for stroke:  High cholesterol, hypertension, and excess weight.  RESOURCES Stroke/Support Group:  Call [phone]   STROKE  EDUCATION PROVIDED/REVIEWED AND GIVEN TO PATIENT Stroke warning signs and symptoms How to activate emergency medical system (call 911). Medications prescribed at discharge. Need for follow-up after discharge. Personal risk factors for stroke. Pneumonia vaccine given:  Flu vaccine given:  My questions have been answered, the writing is legible, and I understand these instructions.  I will adhere to these goals & educational materials that have been provided to me after my discharge from the hospital.     My questions have been answered and I understand these instructions. I will adhere to these goals and the provided educational materials after my discharge from the hospital.  Patient/Caregiver Signature _______________________________ Date __________  Clinician Signature _______________________________________ Date __________  Please bring this form and your medication list with you to all your follow-up doctor's appointments.    Information on my medicine - Pradaxa (dabigatran)  This medication education was reviewed with me or my healthcare representative as part of my discharge preparation.    Why was Pradaxa prescribed for you?   history of pulmonary embolus (PE)  and history of deep vein thrombosis (blood clot)  in setting of hypergoagulable state. Pradaxa was prescribed to treat blood clots that may have been found in the veins of your legs (deep vein thrombosis) or in your lungs (pulmonary embolism) and to reduce the risk of them occurring again.  Pradaxa will take the place of the injectable anticoagulant medication you have been receiving for this condition for the last 5-10 days.  What do you Need to know about PradAXa? Take your Pradaxa TWICE DAILY - one capsule in the morning and one tablet in the evening with or without food.  It would be best to take the doses about the same time each day.  The capsules should not be broken, chewed or opened - they must be swallowed  whole.  Do not store Pradaxa in other medication containers - once the bottle is opened the Pradaxa should be used within FOUR months; throw away any capsules that haven't been by that time.  Take Pradaxa exactly as prescribed by your doctor.  DO NOT stop taking Pradaxa without talking to the doctor who prescribed the medication.  Refill your prescription before you run out.  After discharge, you should have regular check-up appointments with your healthcare provider that is prescribing your Pradaxa.  In the future your dose may need to be changed if your kidney function or weight changes by a significant amount.  What do you do if you miss a dose? If you miss a dose, take it as soon as you remember on the same  day.  If your next dose is less than 6 hours away, skip the missed dose.  Do not take two doses of PRADAXA at the same time.  Important Safety Information A possible side effect of Pradaxa is bleeding. You should call your healthcare provider right away if you experience any of the following: ? Bleeding from an injury or your nose that does not stop. ? Unusual colored urine (red or dark brown) or unusual colored stools (red or black). ? Unusual bruising for unknown reasons. ? A serious fall or if you hit your head (even if there is no bleeding).  Some medicines may interact with Pradaxa and might increase your risk of bleeding or clotting while on Pradaxa. To help avoid this, consult your healthcare provider or pharmacist prior to using any new prescription or non-prescription medications, including herbals, vitamins, non-steroidal anti-inflammatory drugs (NSAIDs) and supplements.  This website has more information on Pradaxa (dabigatran): www.RuleEnforcement.cz.

## 2021-03-11 NOTE — Progress Notes (Signed)
Patient ID: Matthew Wise, male   DOB: 1961-11-09, 60 y.o.   MRN: [id number]  Pt declined by [facility] due to staffing  [name], [place]

## 2021-03-12 ENCOUNTER — Telehealth (INDEPENDENT_AMBULATORY_CARE_PROVIDER_SITE_OTHER): Payer: Self-pay

## 2021-03-12 NOTE — Telephone Encounter (Cosign Needed)
Called pt at home/mobile phone. No way to leave a message. This is the second attempt on 03/20/21 @ 9:18 am. This note will be closed today.    Transition Care Management Follow-up Telephone Call Date of discharge and from where: Home on 03/12/2021. How have you been since you were released from the hospital?  Any questions or concerns? No  Items Reviewed: Did the pt receive and understand the discharge instructions provided? No  Medications obtained and verified? No  Other? No  Any new allergies since your discharge? No  Dietary orders reviewed? No Do you have support at home? No   Home Care and Equipment/Supplies: Were home health services ordered? no If so, what is the name of the agency?   Has the agency set up a time to come to the patient's home? no Were any new equipment or medical supplies ordered?  No What is the name of the medical supply agency?  Were you able to get the supplies/equipment? not applicable Do you have any questions related to the use of the equipment or supplies? No  Functional Questionnaire: (I = Independent and D = Dependent) ADLs: NOT ABLE TO REVIEW.  Bathing/Dressing-  NOT ABLE TO REVIEW.  Meal Prep- NOT ABLE TO REVIEW. Eating- NOT ABLE TO REVIEW.  Maintaining continence- NOT ABLE TO REVIEW.  Transferring/Ambulation- NOT ABLE TO REVIEW.   Managing Meds- NOT ABLE TO REVIEW.  Follow up appointments reviewed:  PCP Hospital f/u appt confirmed? No  Scheduled to see  on  @ . [facility] f/u appt confirmed? No  Scheduled to see  on  @ . Are transportation arrangements needed? No  If their condition worsens, is the pt aware to call PCP or go to the Emergency Dept.? No Was the patient provided with contact information for the PCP's office or ED? No Was to pt encouraged to call back with questions or concerns? No

## 2021-03-12 NOTE — Progress Notes (Signed)
Patient

## 2021-03-12 NOTE — Progress Notes (Signed)
Patient ID: Matthew Wise, male   DOB: [birth year], 60 y.o.   MRN: [id number]   Pt PTAR transfer scheduled for 10AM to d/c home.  [place], [place]

## 2021-03-12 NOTE — Progress Notes (Signed)
Inpatient Rehabilitation Care Coordinator Discharge Note  The overall goal for the admission was met for:   Discharge location: Yes, home  Length of Stay: Yes, 28 days  Discharge activity level: Yes  Home/community participation: Yes  Services provided included: MD, RD, PT, OT, SLP, RN, CM, TR, Pharmacy, Neuropsych and SW  Financial Services: Private Insurance: [facility] Medicare  Choices offered to/list presented to:pt sister Deneise Lever)  Follow-up services arranged: Home Health: [facility]  Comments (or additional information): Custom WC, HC, Rolling Walker, Bedside Commode PT OT [facility]  Patient/Family verbalized understanding of follow-up arrangements: Yes  Individual responsible for coordination of the follow-up plan: Deneise Lever [phone]  Confirmed correct DME delivered: Dyanne Iha 03/12/2021    Dyanne Iha

## 2021-03-12 NOTE — Progress Notes (Signed)
PROGRESS NOTE   Subjective/Complaints: Feels ready for d/c  ROS: Patient denies CP, SOB, N/V/D    Objective:   No results found. Recent Labs    03/10/21 0655  WBC 4.2  HGB 12.2*  HCT 36.9*  PLT 163   Recent Labs    03/10/21 0655  NA 143  K 3.6  CL 106  CO2 31  GLUCOSE 95  BUN 13  CREATININE 1.69*  CALCIUM 9.7    Intake/Output Summary (Last 24 hours) at 03/12/2021 0858 Last data filed at 03/12/2021 0809 Gross per 24 hour  Intake 360 ml  Output 1750 ml  Net -1390 ml        Physical Exam: Vital Signs Blood pressure 124/69, pulse (!) 56, temperature 97.8 F (36.6 C), temperature source Oral, resp. rate 18, height 6' (1.829 m), weight 117.6 kg, SpO2 100 %.  General: No acute distress Mood and affect are appropriate Heart: Regular rate and rhythm no rubs murmurs or extra sounds Lungs: Clear to auscultation, breathing unlabored, no rales or wheezes Abdomen: Positive bowel sounds, soft nontender to palpation, nondistended Extremities: No clubbing, cyanosis, or edema Skin: No evidence of breakdown, no evidence of rash Sensation absent LT on LUE and LLE  Motor:  RUE/RLE: 5/5 proximal to distal LUE/LLE: 3-/5 Left hip /knee ext  Tone - MAS 3/4 in L pec, L biceps. No change   Assessment/Plan: 1. Functional deficits which require 3+ hours per day of interdisciplinary therapy in a comprehensive inpatient rehab setting.  Physiatrist is providing close team supervision and 24 hour management of active medical problems listed below.  Physiatrist and rehab team continue to assess barriers to discharge/monitor patient progress toward functional and medical goals  Care Tool:  Bathing    Body parts bathed by patient: Left arm,Chest,Abdomen,Right upper leg,Front perineal area,Left upper leg,Right lower leg,Face,Left lower leg   Body parts bathed by helper: Buttocks,Right arm,Left lower leg     Bathing assist  Assist Level: Moderate Assistance - Patient 50 - 74%     Upper Body Dressing/Undressing Upper body dressing   What is the patient wearing?: Pull over shirt    Upper body assist Assist Level: Minimal Assistance - Patient > 75%    Lower Body Dressing/Undressing Lower body dressing      What is the patient wearing?: Pants,Incontinence brief     Lower body assist Assist for lower body dressing: Moderate Assistance - Patient 50 - 74%     Toileting Toileting    Toileting assist Assist for toileting: Maximal Assistance - Patient 25 - 49%     Transfers Chair/bed transfer  Transfers assist     Chair/bed transfer assist level: Moderate Assistance - Patient 50 - 74%     Locomotion Ambulation   Ambulation assist   Ambulation activity did not occur: Safety/medical concerns  Assist level: 2 helpers Assistive device: Other (comment) (3 Musketeer) Max distance: 94ft   Walk 10 feet activity   Assist  Walk 10 feet activity did not occur: Safety/medical concerns  Assist level: 2 helpers Assistive device: Other (comment) (3 Musketeer)   Walk 50 feet activity   Assist Walk 50 feet with 2 turns activity did not  occur: Safety/medical concerns         Walk 150 feet activity   Assist Walk 150 feet activity did not occur: Safety/medical concerns         Walk 10 feet on uneven surface  activity   Assist Walk 10 feet on uneven surfaces activity did not occur: Safety/medical concerns         Wheelchair     Assist Will patient use wheelchair at discharge?: Yes Type of Wheelchair: Manual Wheelchair activity did not occur: Safety/medical concerns  Wheelchair assist level: Contact Guard/Touching assist Max wheelchair distance: 150    Wheelchair 50 feet with 2 turns activity    Assist        Assist Level: Contact Guard/Touching assist   Wheelchair 150 feet activity     Assist      Assist Level: Contact Guard/Touching assist   Blood  pressure 124/69, pulse (!) 56, temperature 97.8 F (36.6 C), temperature source Oral, resp. rate 18, height 6' (1.829 m), weight 117.6 kg, SpO2 100 %.   Medical Problem List and Plan: 1.  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  Continue Pradaxa.              -antiplatelet therapy: Aspirin 81 mg daily 3. Pain Management: Continue Tylenol as needed  4/2-patient denies pain; continue regimen 4. Mood: Provide emotional support             -antipsychotic agents: N/A 5. Neuropsych: This patient is capable of making decisions on his own behalf. 6. Skin/Wound Care: Routine skin checks 7. Fluids/Electrolytes/Nutrition: Routine in and outs 8.  Seizure disorder.  Continue Keppra 500 mg twice daily  No breakthrough seizures 9.  Hypertension.    Lisinopril 40 mg daily, decreased to 20 on 3/28  Hydralazine 50 mg every 8 hours  Norvasc 10 mg daily.   Vitals:   03/11/21 1929 03/12/21 0355  BP: 125/61 124/69  Pulse: 61 (!) 56  Resp: 18 18  Temp: 98.4 F (36.9 C) 97.8 F (36.6 C)  SpO2: 100% 100%     Lisinopril decreased to 10mg  - bp's normal 4/13  10.  Hypothyroidism.  Synthroid 11.  CKD stage III.  Baseline creatinine 1.83.  Creatinine 1.65 on 4/4 stable  12.  History of tobacco abuse.  Provide counseling 13.  Hyperlipidemia: Lipitor 14. 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  UA unremarkable, urine culture negative   15. Seasonal allergies: continue Flonase.  16. Post stroke spasticity:holding  Tizanidine due to drowsiness ,    ?botox as outpt  17.  Post stroke dysphagia  D3 thins, advance diet as tolerated 18.  Constipation  +BM 4/10  LOS: 28 days A FACE TO FACE EVALUATION WAS PERFORMED  Charlett Blake 03/12/2021, 8:58 AM

## 2021-03-12 NOTE — Progress Notes (Signed)
Patient ID: Matthew Wise, male   DOB: Apr 22, 1961, 60 y.o.   MRN: [id number]   SW received call from pt sister reporting they don't believe they will be able to handle pt's care. SW provided list of SNF facilities to family.   [place], [place]

## 2021-03-13 ENCOUNTER — Telehealth: Payer: Self-pay

## 2021-03-13 DIAGNOSIS — R350 Frequency of micturition: Secondary | ICD-10-CM | POA: Diagnosis not present

## 2021-03-13 DIAGNOSIS — I69391 Dysphagia following cerebral infarction: Secondary | ICD-10-CM | POA: Diagnosis not present

## 2021-03-13 DIAGNOSIS — I952 Hypotension due to drugs: Secondary | ICD-10-CM | POA: Diagnosis not present

## 2021-03-13 DIAGNOSIS — L8990 Pressure ulcer of unspecified site, unspecified stage: Secondary | ICD-10-CM | POA: Diagnosis not present

## 2021-03-13 DIAGNOSIS — I63511 Cerebral infarction due to unspecified occlusion or stenosis of right middle cerebral artery: Secondary | ICD-10-CM | POA: Diagnosis not present

## 2021-03-13 NOTE — Telephone Encounter (Signed)
Transitional Care call--Annie-sister of patient    1. Are you/is patient experiencing any problems since coming home? Meds Are there any questions regarding any aspect of care? No 2. Are there any questions regarding medications administration/dosing? Has not got Pradaxa or Provigil. Pradaxa cost is too high, will back to hospital and get a saving card. Provigil, Linna Hoff will do a PA Are meds being taken as prescribed? Patient should review meds with caller to confirm 3. Have there been any falls? No 4. Has Home Health been to the house and/or have they contacted you? No. Sister will call back Monday if not heard from [facility] If not, have you tried to contact them? Can we help you contact them? 5. Are bowels and bladder emptying properly? Yes Are there any unexpected incontinence issues? No If applicable, is patient following bowel/bladder programs? 6. Any fevers, problems with breathing, unexpected pain? No 7. Are there any skin problems or new areas of breakdown? No 8. Has the patient/family member arranged specialty MD follow up (ie cardiology/neurology/renal/surgical/etc)? [facility] only  Can we help arrange? 9. Does the patient need any other services or support that we can help arrange? No 10. Are caregivers following through as expected in assisting the patient? Yes 11. Has the patient quit smoking, drinking alcohol, or using drugs as recommended? Yes  Appointment time 2:00 pm, arrive time 1:45 pm on 03/25/21 with Zella Ball then Dr. Letta Pate [address] [phone]

## 2021-03-14 DIAGNOSIS — E119 Type 2 diabetes mellitus without complications: Secondary | ICD-10-CM | POA: Diagnosis not present

## 2021-03-14 DIAGNOSIS — Z0100 Encounter for examination of eyes and vision without abnormal findings: Secondary | ICD-10-CM | POA: Diagnosis not present

## 2021-03-19 ENCOUNTER — Telehealth (INDEPENDENT_AMBULATORY_CARE_PROVIDER_SITE_OTHER): Payer: Self-pay

## 2021-03-19 NOTE — Telephone Encounter (Signed)
Received a VM from Jannifer Hick who is a Publishing copy with [facility] and she is requesting if Dr. Anastasio Champion will sign the PT, OT, and Speech Therapy orders for this patient? [phone]  Please advise.

## 2021-03-19 NOTE — Telephone Encounter (Signed)
Yes, I will sign those orders when they are faxed over.

## 2021-03-20 ENCOUNTER — Encounter (INDEPENDENT_AMBULATORY_CARE_PROVIDER_SITE_OTHER): Payer: Medicare Other | Admitting: Nurse Practitioner

## 2021-03-20 ENCOUNTER — Encounter (INDEPENDENT_AMBULATORY_CARE_PROVIDER_SITE_OTHER): Payer: Self-pay | Admitting: Nurse Practitioner

## 2021-03-20 ENCOUNTER — Ambulatory Visit (INDEPENDENT_AMBULATORY_CARE_PROVIDER_SITE_OTHER): Payer: Medicare Other | Admitting: Nurse Practitioner

## 2021-03-20 NOTE — Telephone Encounter (Signed)
Called number to [facility] and gave a call center lady the information to relay back to [place]. Receptionist verbalized an understanding and never gave me her name upon request.

## 2021-03-21 DIAGNOSIS — Z7982 Long term (current) use of aspirin: Secondary | ICD-10-CM | POA: Diagnosis not present

## 2021-03-21 DIAGNOSIS — E041 Nontoxic single thyroid nodule: Secondary | ICD-10-CM | POA: Diagnosis not present

## 2021-03-21 DIAGNOSIS — R7303 Prediabetes: Secondary | ICD-10-CM | POA: Diagnosis not present

## 2021-03-21 DIAGNOSIS — E782 Mixed hyperlipidemia: Secondary | ICD-10-CM | POA: Diagnosis not present

## 2021-03-21 DIAGNOSIS — Z86718 Personal history of other venous thrombosis and embolism: Secondary | ICD-10-CM | POA: Diagnosis not present

## 2021-03-21 DIAGNOSIS — I129 Hypertensive chronic kidney disease with stage 1 through stage 4 chronic kidney disease, or unspecified chronic kidney disease: Secondary | ICD-10-CM | POA: Diagnosis not present

## 2021-03-21 DIAGNOSIS — E039 Hypothyroidism, unspecified: Secondary | ICD-10-CM | POA: Diagnosis not present

## 2021-03-21 DIAGNOSIS — N183 Chronic kidney disease, stage 3 unspecified: Secondary | ICD-10-CM | POA: Diagnosis not present

## 2021-03-21 DIAGNOSIS — I69354 Hemiplegia and hemiparesis following cerebral infarction affecting left non-dominant side: Secondary | ICD-10-CM | POA: Diagnosis not present

## 2021-03-21 DIAGNOSIS — Z86711 Personal history of pulmonary embolism: Secondary | ICD-10-CM | POA: Diagnosis not present

## 2021-03-25 ENCOUNTER — Encounter: Payer: Medicare Other | Admitting: Registered Nurse

## 2021-03-25 DIAGNOSIS — R7303 Prediabetes: Secondary | ICD-10-CM | POA: Diagnosis not present

## 2021-03-25 DIAGNOSIS — Z7982 Long term (current) use of aspirin: Secondary | ICD-10-CM | POA: Diagnosis not present

## 2021-03-25 DIAGNOSIS — I129 Hypertensive chronic kidney disease with stage 1 through stage 4 chronic kidney disease, or unspecified chronic kidney disease: Secondary | ICD-10-CM | POA: Diagnosis not present

## 2021-03-25 DIAGNOSIS — N183 Chronic kidney disease, stage 3 unspecified: Secondary | ICD-10-CM | POA: Diagnosis not present

## 2021-03-25 DIAGNOSIS — E782 Mixed hyperlipidemia: Secondary | ICD-10-CM | POA: Diagnosis not present

## 2021-03-25 DIAGNOSIS — E041 Nontoxic single thyroid nodule: Secondary | ICD-10-CM | POA: Diagnosis not present

## 2021-03-25 DIAGNOSIS — Z86711 Personal history of pulmonary embolism: Secondary | ICD-10-CM | POA: Diagnosis not present

## 2021-03-25 DIAGNOSIS — E039 Hypothyroidism, unspecified: Secondary | ICD-10-CM | POA: Diagnosis not present

## 2021-03-25 DIAGNOSIS — I69354 Hemiplegia and hemiparesis following cerebral infarction affecting left non-dominant side: Secondary | ICD-10-CM | POA: Diagnosis not present

## 2021-03-25 DIAGNOSIS — Z86718 Personal history of other venous thrombosis and embolism: Secondary | ICD-10-CM | POA: Diagnosis not present

## 2021-03-26 DIAGNOSIS — Z7982 Long term (current) use of aspirin: Secondary | ICD-10-CM | POA: Diagnosis not present

## 2021-03-26 DIAGNOSIS — E782 Mixed hyperlipidemia: Secondary | ICD-10-CM | POA: Diagnosis not present

## 2021-03-26 DIAGNOSIS — N183 Chronic kidney disease, stage 3 unspecified: Secondary | ICD-10-CM | POA: Diagnosis not present

## 2021-03-26 DIAGNOSIS — R7303 Prediabetes: Secondary | ICD-10-CM | POA: Diagnosis not present

## 2021-03-26 DIAGNOSIS — I69354 Hemiplegia and hemiparesis following cerebral infarction affecting left non-dominant side: Secondary | ICD-10-CM | POA: Diagnosis not present

## 2021-03-26 DIAGNOSIS — I129 Hypertensive chronic kidney disease with stage 1 through stage 4 chronic kidney disease, or unspecified chronic kidney disease: Secondary | ICD-10-CM | POA: Diagnosis not present

## 2021-03-26 DIAGNOSIS — E041 Nontoxic single thyroid nodule: Secondary | ICD-10-CM | POA: Diagnosis not present

## 2021-03-26 DIAGNOSIS — Z86718 Personal history of other venous thrombosis and embolism: Secondary | ICD-10-CM | POA: Diagnosis not present

## 2021-03-26 DIAGNOSIS — Z86711 Personal history of pulmonary embolism: Secondary | ICD-10-CM | POA: Diagnosis not present

## 2021-03-26 DIAGNOSIS — E039 Hypothyroidism, unspecified: Secondary | ICD-10-CM | POA: Diagnosis not present

## 2021-03-27 ENCOUNTER — Telehealth (INDEPENDENT_AMBULATORY_CARE_PROVIDER_SITE_OTHER): Payer: Self-pay

## 2021-03-27 NOTE — Telephone Encounter (Signed)
Yes, okay to continue.

## 2021-03-27 NOTE — Telephone Encounter (Signed)
Called patient and LMOVM to return call  Left a detailed voice message for [facility] with the verbal approval for orders from Dr. Anastasio Champion.

## 2021-03-27 NOTE — Telephone Encounter (Signed)
Matthew Wise with [facility] called and stated that she needs orders to continue OT for the patient,  2 X a week for 4 weeks 1 X a week for 3 weeks  Okay to continue orders?  [phone] OK to LVM

## 2021-03-31 DIAGNOSIS — E039 Hypothyroidism, unspecified: Secondary | ICD-10-CM | POA: Diagnosis not present

## 2021-03-31 DIAGNOSIS — I69354 Hemiplegia and hemiparesis following cerebral infarction affecting left non-dominant side: Secondary | ICD-10-CM | POA: Diagnosis not present

## 2021-03-31 DIAGNOSIS — Z86711 Personal history of pulmonary embolism: Secondary | ICD-10-CM | POA: Diagnosis not present

## 2021-03-31 DIAGNOSIS — I129 Hypertensive chronic kidney disease with stage 1 through stage 4 chronic kidney disease, or unspecified chronic kidney disease: Secondary | ICD-10-CM | POA: Diagnosis not present

## 2021-03-31 DIAGNOSIS — G40909 Epilepsy, unspecified, not intractable, without status epilepticus: Secondary | ICD-10-CM

## 2021-03-31 DIAGNOSIS — R7303 Prediabetes: Secondary | ICD-10-CM | POA: Diagnosis not present

## 2021-03-31 DIAGNOSIS — N183 Chronic kidney disease, stage 3 unspecified: Secondary | ICD-10-CM | POA: Diagnosis not present

## 2021-03-31 DIAGNOSIS — E041 Nontoxic single thyroid nodule: Secondary | ICD-10-CM | POA: Diagnosis not present

## 2021-03-31 DIAGNOSIS — E782 Mixed hyperlipidemia: Secondary | ICD-10-CM | POA: Diagnosis not present

## 2021-03-31 DIAGNOSIS — Z7982 Long term (current) use of aspirin: Secondary | ICD-10-CM | POA: Diagnosis not present

## 2021-04-01 ENCOUNTER — Telehealth (INDEPENDENT_AMBULATORY_CARE_PROVIDER_SITE_OTHER): Payer: Self-pay

## 2021-04-01 DIAGNOSIS — E041 Nontoxic single thyroid nodule: Secondary | ICD-10-CM | POA: Diagnosis not present

## 2021-04-01 DIAGNOSIS — Z86711 Personal history of pulmonary embolism: Secondary | ICD-10-CM | POA: Diagnosis not present

## 2021-04-01 DIAGNOSIS — Z7982 Long term (current) use of aspirin: Secondary | ICD-10-CM | POA: Diagnosis not present

## 2021-04-01 DIAGNOSIS — E782 Mixed hyperlipidemia: Secondary | ICD-10-CM | POA: Diagnosis not present

## 2021-04-01 DIAGNOSIS — E039 Hypothyroidism, unspecified: Secondary | ICD-10-CM | POA: Diagnosis not present

## 2021-04-01 DIAGNOSIS — I69354 Hemiplegia and hemiparesis following cerebral infarction affecting left non-dominant side: Secondary | ICD-10-CM | POA: Diagnosis not present

## 2021-04-01 DIAGNOSIS — R7303 Prediabetes: Secondary | ICD-10-CM | POA: Diagnosis not present

## 2021-04-01 DIAGNOSIS — Z86718 Personal history of other venous thrombosis and embolism: Secondary | ICD-10-CM | POA: Diagnosis not present

## 2021-04-01 DIAGNOSIS — I129 Hypertensive chronic kidney disease with stage 1 through stage 4 chronic kidney disease, or unspecified chronic kidney disease: Secondary | ICD-10-CM | POA: Diagnosis not present

## 2021-04-01 DIAGNOSIS — N183 Chronic kidney disease, stage 3 unspecified: Secondary | ICD-10-CM | POA: Diagnosis not present

## 2021-04-01 NOTE — Telephone Encounter (Signed)
.  Occupational Therapy call from [facility] @ [place]. OT verbal orders for  1x;1wk, 2x: 4wk, 1x:3wk will fax over orders to sign later in the week.

## 2021-04-02 DIAGNOSIS — E039 Hypothyroidism, unspecified: Secondary | ICD-10-CM | POA: Diagnosis not present

## 2021-04-02 DIAGNOSIS — E782 Mixed hyperlipidemia: Secondary | ICD-10-CM | POA: Diagnosis not present

## 2021-04-02 DIAGNOSIS — R7303 Prediabetes: Secondary | ICD-10-CM | POA: Diagnosis not present

## 2021-04-02 DIAGNOSIS — Z86711 Personal history of pulmonary embolism: Secondary | ICD-10-CM | POA: Diagnosis not present

## 2021-04-02 DIAGNOSIS — I69354 Hemiplegia and hemiparesis following cerebral infarction affecting left non-dominant side: Secondary | ICD-10-CM | POA: Diagnosis not present

## 2021-04-02 DIAGNOSIS — I129 Hypertensive chronic kidney disease with stage 1 through stage 4 chronic kidney disease, or unspecified chronic kidney disease: Secondary | ICD-10-CM | POA: Diagnosis not present

## 2021-04-02 DIAGNOSIS — E041 Nontoxic single thyroid nodule: Secondary | ICD-10-CM | POA: Diagnosis not present

## 2021-04-02 DIAGNOSIS — Z7982 Long term (current) use of aspirin: Secondary | ICD-10-CM | POA: Diagnosis not present

## 2021-04-02 DIAGNOSIS — Z86718 Personal history of other venous thrombosis and embolism: Secondary | ICD-10-CM | POA: Diagnosis not present

## 2021-04-02 DIAGNOSIS — N183 Chronic kidney disease, stage 3 unspecified: Secondary | ICD-10-CM | POA: Diagnosis not present

## 2021-04-03 ENCOUNTER — Telehealth (INDEPENDENT_AMBULATORY_CARE_PROVIDER_SITE_OTHER): Payer: Self-pay

## 2021-04-03 DIAGNOSIS — E782 Mixed hyperlipidemia: Secondary | ICD-10-CM | POA: Diagnosis not present

## 2021-04-03 DIAGNOSIS — Z86711 Personal history of pulmonary embolism: Secondary | ICD-10-CM | POA: Diagnosis not present

## 2021-04-03 DIAGNOSIS — N183 Chronic kidney disease, stage 3 unspecified: Secondary | ICD-10-CM | POA: Diagnosis not present

## 2021-04-03 DIAGNOSIS — E041 Nontoxic single thyroid nodule: Secondary | ICD-10-CM | POA: Diagnosis not present

## 2021-04-03 DIAGNOSIS — Z7982 Long term (current) use of aspirin: Secondary | ICD-10-CM | POA: Diagnosis not present

## 2021-04-03 DIAGNOSIS — R7303 Prediabetes: Secondary | ICD-10-CM | POA: Diagnosis not present

## 2021-04-03 DIAGNOSIS — I129 Hypertensive chronic kidney disease with stage 1 through stage 4 chronic kidney disease, or unspecified chronic kidney disease: Secondary | ICD-10-CM | POA: Diagnosis not present

## 2021-04-03 DIAGNOSIS — E039 Hypothyroidism, unspecified: Secondary | ICD-10-CM | POA: Diagnosis not present

## 2021-04-03 DIAGNOSIS — I69354 Hemiplegia and hemiparesis following cerebral infarction affecting left non-dominant side: Secondary | ICD-10-CM | POA: Diagnosis not present

## 2021-04-03 DIAGNOSIS — Z86718 Personal history of other venous thrombosis and embolism: Secondary | ICD-10-CM | POA: Diagnosis not present

## 2021-04-03 NOTE — Telephone Encounter (Signed)
Matthew Wise called from [facility] and needs verbal orders for the following:  1 time a week for 4 weeks for speech therapy  Can call back at [phone]  OK to leave a voice message.  Please advise if OK for verbal orders.

## 2021-04-03 NOTE — Telephone Encounter (Signed)
Matthew Wise and gave her the verbal OK orders per Dr. Anastasio Champion. Matthew Wise verbalized an understanding and thanked [place].

## 2021-04-03 NOTE — Telephone Encounter (Signed)
Yes, you can give verbal orders for those.  Thanks.

## 2021-04-04 ENCOUNTER — Encounter: Payer: Self-pay | Admitting: Registered Nurse

## 2021-04-04 ENCOUNTER — Other Ambulatory Visit: Payer: Self-pay

## 2021-04-04 ENCOUNTER — Encounter: Payer: Medicare Other | Attending: Registered Nurse | Admitting: Registered Nurse

## 2021-04-04 VITALS — BP 99/61 | HR 64 | Ht 72.0 in | Wt 260.0 lb

## 2021-04-04 DIAGNOSIS — G8110 Spastic hemiplegia affecting unspecified side: Secondary | ICD-10-CM | POA: Diagnosis not present

## 2021-04-04 DIAGNOSIS — E785 Hyperlipidemia, unspecified: Secondary | ICD-10-CM | POA: Diagnosis not present

## 2021-04-04 DIAGNOSIS — I63511 Cerebral infarction due to unspecified occlusion or stenosis of right middle cerebral artery: Secondary | ICD-10-CM

## 2021-04-04 DIAGNOSIS — I10 Essential (primary) hypertension: Secondary | ICD-10-CM

## 2021-04-04 DIAGNOSIS — G40909 Epilepsy, unspecified, not intractable, without status epilepticus: Secondary | ICD-10-CM

## 2021-04-04 NOTE — Progress Notes (Signed)
Subjective:    Patient ID: Matthew Wise, male    DOB: 1961-10-05, 60 y.o.   MRN: [id number]  HPI: Matthew Wise is a 60 y.o. male who is here for Hospital Follow up appointment of his Right middle cerebral artery stroke, Spastic hemiplegia affecting non dominant side, Essential Hypertension, Dyslipidemia and Seizure Disorder. He presented to [facility] on 02/02/2021 with acute onset of left hemiparesis and left facial droop, Neurology Following.  CT Head: WO Contrast:  IMPRESSION: No acute intracranial hemorrhage. Suspect acute infarction of the right frontoparietal lobes posterosuperior to chronic infarct with probable involvement of lateral precentral and postcentral gyri. CTA:  IMPRESSION: 1. Abrupt occlusion of the right M1 segment, new as compared to prior CTA from 01/25/2021. There is suggestion of an underlying stenosis at this level on prior CTA from 01/25/2021. 2. Acute core infarct at the posterior right frontoparietal region, largely corresponding with the completed infarct as seen on prior MRI. This may have partially expanded in the interim. Surrounding ischemic penumbra of 57 cc, considered tissue at risk. 3. Fetal type origin of the PCAs with overall diminutive vertebrobasilar system. 4. Otherwise gross patency of the major arterial vasculature of the head and neck, although evaluation somewhat technically limited by timing of the contrast bolus. MR Brain:  IMPRESSION: 1. Large acute right MCA territory infarct with petechial hemorrhage and minimal leftward midline shift, unchanged from today's CT. 2. Small acute left cerebellar infarct. 3. New small acute to early subacute infarcts in the left occipital, left parietal, and left frontal lobes.  He underwent on 02/02/2021 by Dr Estanislado Pandy:  IMPRESSION: Status post endovascular revascularization of occluded right middle cerebral artery distribution achieving a TICI 2C revascularization with 2 passes with a 3 mm  x 40 mm Solitaire X retrieval device and contact aspiration.  Matthew Wise was admitted to inpatient Rehabilitation on 02/12/2021 and discharged home on 03/12/2021. He is receiving Home Health Therapy from [facility]. He denies any pain. He rates his pain 0. Also reports he has a good appetite.    Pain Inventory Average Pain 0 Pain Right Now 0 My pain is no pain  LOCATION OF PAIN  No pain  BOWEL Number of stools per week: ? Oral laxative use Yes  Type of laxative senna Enema or suppository use No  History of colostomy No  Incontinent No   BLADDER Normal In and out cath, frequency n/a Able to self cath n/a Bladder incontinence No  Frequent urination No  Leakage with coughing No  Difficulty starting stream No  Incomplete bladder emptying No    Mobility use a wheelchair needs help with transfers  Function disabled: date disabled .  Neuro/Psych weakness trouble walking  Prior Studies hospital f/u  Physicians involved in your care hospital f/u   Family History  Problem Relation Age of Onset  . COPD Father   . Stroke Brother   . Pulmonary embolism Sister   . Throat cancer Brother   . Arthritis Mother   . Diabetes Mother   . Stroke Mother   . Hypertension Brother   . Diabetes Brother   . Arthritis Brother   . Diabetes Sister   . Colon cancer Neg Hx    Social History   Socioeconomic History  . Marital status: Divorced    Spouse name: Not on file  . Number of children: 2  . Years of education: 2  . Highest education level: Not on file  Occupational History  . Occupation: disability  Comment: DVT  Tobacco Use  . Smoking status: Current Every Day Smoker    Packs/day: 1.00    Years: 30.00    Pack years: 30.00    Types: Cigarettes    Start date: 12/01/1983  . Smokeless tobacco: Never Used  Vaping Use  . Vaping Use: Never used  Substance and Sexual Activity  . Alcohol use: Not Currently    Alcohol/week: 3.0 standard drinks    Types: 3 Cans of  beer per week    Comment: has not drank sice 11/27/20  . Drug use: Not Currently    Comment: REMOTE HISTORY, Quit in 2007  . Sexual activity: Yes  Other Topics Concern  . Not on file  Social History Narrative   Lives with son   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s   Mows lawns some   Social Determinants of Health   Financial Resource Strain: Not on file  Food Insecurity: Not on file  Transportation Needs: Not on file  Physical Activity: Not on file  Stress: Not on file  Social Connections: Not on file   Past Surgical History:  Procedure Laterality Date  . COLONOSCOPY WITH PROPOFOL N/A 10/29/2015   incomplete due to redundant colon. two simple tubular adenomas removed. patient was advised to go to [facility] for colonoscopy but he did not keep appt.  . IR CT HEAD LTD  02/02/2021  . IR PERCUTANEOUS ART THROMBECTOMY/INFUSION INTRACRANIAL INC DIAG ANGIO  02/02/2021  . RADIOLOGY WITH ANESTHESIA N/A 02/02/2021   Procedure: IR WITH ANESTHESIA;  Surgeon: Luanne Bras, MD;  Location: [place] [facility];  Service: Radiology;  Laterality: N/A;  . TEE WITHOUT CARDIOVERSION N/A 09/10/2020   Procedure: TRANSESOPHAGEAL ECHOCARDIOGRAM (TEE) WITH PROPOFOL;  Surgeon: Satira Sark, MD;  Location: [facility];  Service: Cardiovascular;  Laterality: N/A;   Past Medical History:  Diagnosis Date  . Acute CVA (cerebrovascular accident) ([place]) 09/09/2020  . Chronic anticoagulation 11/14/2014  . CKD (chronic kidney disease) stage 3, GFR 30-59 ml/min (HCC) 10/24/2019  . CKD (chronic kidney disease) stage 3, GFR 30-59 ml/min (HCC) 10/24/2019  . Clotting disorder ([place])   . 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 DVT (deep venous thrombosis) ([place]) 06/15/2011  . DVT of leg (deep venous thrombosis) (HCC)    rt. leg  . Essential hypertension 11/28/2020  . H/O PE (pulmonary embolism) 06/15/2011  . Hyperlipidemia 11/28/2020  . Inferior  vena caval thrombosis (HCC)    Chronic  . Prediabetes 10/24/2019  . Thyroid nodule 01/25/2021   BP 99/61   Pulse 64   Ht 6' (1.829 m)   Wt 260 lb (117.9 kg)   SpO2 92%   BMI 35.26 kg/m   Opioid Risk Score:   Fall Risk Score:  `1  Depression screen PHQ 2/9  Depression screen [facility] 2/9 11/20/2020 01/30/2020 12/05/2019 09/14/2017 09/28/2014  Decreased Interest 0 0 0 0 0  Down, Depressed, Hopeless 0 0 0 0 0  PHQ - 2 Score 0 0 0 0 0  Altered sleeping 0 - - - -  Tired, decreased energy 0 - - - -  Change in appetite 0 - - - -  Feeling bad or failure about yourself  0 - - - -  Trouble concentrating 0 - - - -  Moving slowly or fidgety/restless 0 - - - -  Suicidal thoughts 0 - - - -  PHQ-9 Score 0 - - - -  Difficult doing work/chores Not difficult at all - - - -  Some recent data might be hidden    Review of Systems  Constitutional: Negative.   HENT: Negative.   Eyes: Negative.   Respiratory: Negative.   Cardiovascular: Negative.   Gastrointestinal: Negative.   Endocrine: Negative.   Genitourinary: Negative.   Musculoskeletal: Positive for gait problem.  Skin: Negative.   Allergic/Immunologic: Negative.   Neurological: Positive for weakness.  Hematological: Negative.   Psychiatric/Behavioral: Negative.   All other systems reviewed and are negative.      Objective:   Physical Exam Vitals and nursing note reviewed.  Constitutional:      Appearance: Normal appearance.  Cardiovascular:     Rate and Rhythm: Normal rate and regular rhythm.     Pulses: Normal pulses.     Heart sounds: Normal heart sounds.  Pulmonary:     Effort: Pulmonary effort is normal.     Breath sounds: Normal breath sounds.  Musculoskeletal:     Cervical back: Normal range of motion and neck supple.     Comments: Normal Muscle Bulk and Muscle Testing Reveals:  Upper Extremities: Right: Full ROM and Muscle Strength 4/5 Left Upper Extremity: Decreased ROM and Muscle Strength 0/5 Lower Extremities:  Right: Full ROM and Muscle Strength 5/5 Left Lower Extremity: Decreased ROM and Muscle Strength 1/5   Skin:    General: Skin is warm and dry.  Neurological:     Mental Status: He is alert and oriented to person, place, and time.           Assessment & Plan:  1. Right middle cerebral artery stroke/Spastic hemiplegia affecting non dominant side: Continue Home Health Therapy with [facility]. Continue current medication regimen. Neurology Following.  2. Essential Hypertension: Continue current medication regimen. PCP Following.  3. Dyslipidemia: Continue Current medication regimen. PCP Following. Continue to Monitor.  4. Seizure Disorder: Continue Current Medication Regimen. Neurology Following. Continue to Monitor.   F/U in 4- 6 weeks with Dr Letta Pate

## 2021-04-07 DIAGNOSIS — E041 Nontoxic single thyroid nodule: Secondary | ICD-10-CM | POA: Diagnosis not present

## 2021-04-07 DIAGNOSIS — Z86711 Personal history of pulmonary embolism: Secondary | ICD-10-CM | POA: Diagnosis not present

## 2021-04-07 DIAGNOSIS — E782 Mixed hyperlipidemia: Secondary | ICD-10-CM | POA: Diagnosis not present

## 2021-04-07 DIAGNOSIS — I69354 Hemiplegia and hemiparesis following cerebral infarction affecting left non-dominant side: Secondary | ICD-10-CM | POA: Diagnosis not present

## 2021-04-07 DIAGNOSIS — Z7982 Long term (current) use of aspirin: Secondary | ICD-10-CM | POA: Diagnosis not present

## 2021-04-07 DIAGNOSIS — R7303 Prediabetes: Secondary | ICD-10-CM | POA: Diagnosis not present

## 2021-04-07 DIAGNOSIS — E039 Hypothyroidism, unspecified: Secondary | ICD-10-CM | POA: Diagnosis not present

## 2021-04-07 DIAGNOSIS — N183 Chronic kidney disease, stage 3 unspecified: Secondary | ICD-10-CM | POA: Diagnosis not present

## 2021-04-07 DIAGNOSIS — Z86718 Personal history of other venous thrombosis and embolism: Secondary | ICD-10-CM | POA: Diagnosis not present

## 2021-04-07 DIAGNOSIS — I129 Hypertensive chronic kidney disease with stage 1 through stage 4 chronic kidney disease, or unspecified chronic kidney disease: Secondary | ICD-10-CM | POA: Diagnosis not present

## 2021-04-08 ENCOUNTER — Telehealth: Payer: Self-pay

## 2021-04-08 MED ORDER — LEVETIRACETAM 500 MG PO TABS: 500.0000 mg | ORAL_TABLET | Freq: Two times a day (BID) | ORAL | 0 refills | Status: DC

## 2021-04-08 MED ORDER — DABIGATRAN ETEXILATE MESYLATE 150 MG PO CAPS: 150.0000 mg | ORAL_CAPSULE | Freq: Two times a day (BID) | ORAL | 1 refills | Status: DC

## 2021-04-08 MED ORDER — LISINOPRIL 20 MG PO TABS: 20.0000 mg | ORAL_TABLET | Freq: Every day | ORAL | 0 refills | Status: DC

## 2021-04-08 MED ORDER — TAMSULOSIN HCL 0.4 MG PO CAPS: 0.4000 mg | ORAL_CAPSULE | Freq: Every day | ORAL | 0 refills | Status: DC

## 2021-04-08 MED ORDER — PANTOPRAZOLE SODIUM 40 MG PO TBEC: 40.0000 mg | DELAYED_RELEASE_TABLET | Freq: Every day | ORAL | 0 refills | Status: DC

## 2021-04-08 NOTE — Telephone Encounter (Signed)
Need refill on meds, appt with PCP on Thursday but out of meds. Explained that this will be a one time refill.

## 2021-04-09 DIAGNOSIS — I129 Hypertensive chronic kidney disease with stage 1 through stage 4 chronic kidney disease, or unspecified chronic kidney disease: Secondary | ICD-10-CM | POA: Diagnosis not present

## 2021-04-09 DIAGNOSIS — R7303 Prediabetes: Secondary | ICD-10-CM | POA: Diagnosis not present

## 2021-04-09 DIAGNOSIS — N183 Chronic kidney disease, stage 3 unspecified: Secondary | ICD-10-CM | POA: Diagnosis not present

## 2021-04-09 DIAGNOSIS — E041 Nontoxic single thyroid nodule: Secondary | ICD-10-CM | POA: Diagnosis not present

## 2021-04-09 DIAGNOSIS — E782 Mixed hyperlipidemia: Secondary | ICD-10-CM | POA: Diagnosis not present

## 2021-04-09 DIAGNOSIS — Z86718 Personal history of other venous thrombosis and embolism: Secondary | ICD-10-CM | POA: Diagnosis not present

## 2021-04-09 DIAGNOSIS — Z7982 Long term (current) use of aspirin: Secondary | ICD-10-CM | POA: Diagnosis not present

## 2021-04-09 DIAGNOSIS — E039 Hypothyroidism, unspecified: Secondary | ICD-10-CM | POA: Diagnosis not present

## 2021-04-09 DIAGNOSIS — I69354 Hemiplegia and hemiparesis following cerebral infarction affecting left non-dominant side: Secondary | ICD-10-CM | POA: Diagnosis not present

## 2021-04-09 DIAGNOSIS — Z86711 Personal history of pulmonary embolism: Secondary | ICD-10-CM | POA: Diagnosis not present

## 2021-04-10 ENCOUNTER — Telehealth (INDEPENDENT_AMBULATORY_CARE_PROVIDER_SITE_OTHER): Payer: Self-pay | Admitting: Nurse Practitioner

## 2021-04-10 ENCOUNTER — Encounter (INDEPENDENT_AMBULATORY_CARE_PROVIDER_SITE_OTHER): Payer: Self-pay | Admitting: Nurse Practitioner

## 2021-04-10 ENCOUNTER — Ambulatory Visit (INDEPENDENT_AMBULATORY_CARE_PROVIDER_SITE_OTHER): Payer: Medicare Other | Admitting: Nurse Practitioner

## 2021-04-10 ENCOUNTER — Other Ambulatory Visit: Payer: Self-pay

## 2021-04-10 ENCOUNTER — Telehealth (INDEPENDENT_AMBULATORY_CARE_PROVIDER_SITE_OTHER): Payer: Self-pay

## 2021-04-10 VITALS — BP 118/78 | HR 59 | Temp 97.0°F | Ht 72.0 in

## 2021-04-10 DIAGNOSIS — Z8673 Personal history of transient ischemic attack (TIA), and cerebral infarction without residual deficits: Secondary | ICD-10-CM | POA: Diagnosis not present

## 2021-04-10 DIAGNOSIS — Z1211 Encounter for screening for malignant neoplasm of colon: Secondary | ICD-10-CM | POA: Diagnosis not present

## 2021-04-10 DIAGNOSIS — I10 Essential (primary) hypertension: Secondary | ICD-10-CM

## 2021-04-10 DIAGNOSIS — Z7901 Long term (current) use of anticoagulants: Secondary | ICD-10-CM

## 2021-04-10 DIAGNOSIS — E039 Hypothyroidism, unspecified: Secondary | ICD-10-CM

## 2021-04-10 DIAGNOSIS — D6851 Activated protein C resistance: Secondary | ICD-10-CM

## 2021-04-10 MED ORDER — LISINOPRIL 20 MG PO TABS: 20.0000 mg | ORAL_TABLET | Freq: Every day | ORAL | 3 refills | Status: DC

## 2021-04-10 MED ORDER — TAMSULOSIN HCL 0.4 MG PO CAPS: 0.4000 mg | ORAL_CAPSULE | Freq: Every day | ORAL | 3 refills | Status: DC

## 2021-04-10 MED ORDER — DABIGATRAN ETEXILATE MESYLATE 150 MG PO CAPS: 150.0000 mg | ORAL_CAPSULE | Freq: Two times a day (BID) | ORAL | 3 refills | Status: DC

## 2021-04-10 MED ORDER — PANTOPRAZOLE SODIUM 40 MG PO TBEC: 40.0000 mg | DELAYED_RELEASE_TABLET | Freq: Every day | ORAL | 3 refills | Status: DC

## 2021-04-10 MED ORDER — LEVETIRACETAM 500 MG PO TABS: 500.0000 mg | ORAL_TABLET | Freq: Two times a day (BID) | ORAL | 3 refills | Status: DC

## 2021-04-10 NOTE — Progress Notes (Signed)
Subjective:  Patient ID: Matthew Wise, male    DOB: November 05, 1961  Age: 60 y.o. MRN: [id number]  CC:  Chief Complaint  Patient presents with  . Follow-up    Look at medications because he may need changes, has been doing good  . Other    Status post recent CVA  . Hypothyroidism  . Hypertension      HPI  This patient arrives today for the above.  Hypothyroidism: He continues on levothyroxine 25 mcg daily.  He is due to have TSH checked today.  He also has a history of goiter on ultrasound of thyroid which should be repeated for monitoring anytime after 01/26/2022.  Recent CVA: He is recently hospitalized in February and March for stroke.  He did go through inpatient rehabilitation.  He is now home.  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  He is wheelchair-bound today.  He is able to move his left side, however it is much weaker and he is experiencing numbness towards that side.  He is able to speak clearly.  He was changed from Eliquis to Pradaxa due to experiencing stroke despite being on Eliquis. Last LDL 25 in 01/2021.  He is on aspirin 81 mg daily as well as atorvastatin 40 mg daily.  He denies any signs of GI bleed  Hypertension: He continues on amlodipine 5 mg daily and hydralazine 41m TID.  Of note, he is due for colon cancer screening. He would be interested in speaking to GI doctor about colon cancer screening options.   Past Medical History:  Diagnosis Date  . Acute CV[place] (cerebrovascular [place]) ([place] 09/[place]  . Chronic anticoagulation 11/14/2014  . CKD (chronic kidney disease) stage 3, GFR 30-59 ml/min (HCC) 10/24/2019  . CKD (chronic kidney disease) stage 3, GFR 30-59 ml/min (HCC) 10/24/2019  . Clotting disorder ([facility]   . 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 DVT [place]) ([facility] [facility] 06/15/2011  . DVT of leg (deep venous thrombosis) (HCC)    rt. leg  .  Essential hypertension 11/28/2020  . H/O PE (pulmonary embolism) 06/15/2011  . Hyperlipidemia 11/28/2020  . 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 Prediabetes 10/24/2019  . Thyroid nodule 01/25/2021      Family History  Problem Relation Age of Onset  . COPD Father   . Stroke Brother   . Pulmonary embolism Sister   . Throat cancer Brother   . Arthritis Mother   . Diabetes Mother   . Stroke Mother   . Hypertension Brother   . Diabetes Brother   . Arthritis Brother   . Diabetes Sister   . Colon cancer Neg Hx     Social History   Social History Narrative   Lives with son   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s   Mows lawns some   Social History   Tobacco Use  . Smoking status: Current Every Day Smoker    Packs/day: 1.00    Years: 30.00    Pack years: 30.00    Types: Cigarettes    Start date: 12/01/1983  . Smokeless tobacco: Never Used  Substance Use Topics  . Alcohol use: Not Currently    Alcohol/week: 3.0 standard drinks    Types: 3 Cans of beer per week    Comment: has not drank sice 11/27/20     Current Meds  Medication Sig  . acetaminophen (  TYLENOL) 325 MG tablet Take 2 tablets (650 mg total) by mouth every 4 (four) hours as needed for mild pain (or temp > 37.5 C (99.5 F)).  Marland Kitchen amLODipine (NORVASC) 5 MG tablet Take 1 tablet by mouth daily.  Marland Kitchen aspirin EC 81 MG EC tablet Take 1 tablet (81 mg total) by mouth daily. Swallow whole.  Marland Kitchen atorvastatin (LIPITOR) 40 MG tablet Take 1 tablet (40 mg total) by mouth daily.  . dabigatran (PRADAXA) 150 MG CAPS capsule Take 1 capsule (150 mg total) by mouth every 12 (twelve) hours.  . hydrALAZINE (APRESOLINE) 50 MG tablet Take 1 tablet (50 mg total) by mouth every 8 (eight) hours.  . levETIRAcetam (KEPPRA) 500 MG tablet Take 1 tablet (500 mg total) by mouth 2 (two) times daily.  Marland Kitchen levothyroxine (SYNTHROID) 25 MCG tablet Take 1 tablet (25 mcg total) by mouth daily.  Marland Kitchen lisinopril (ZESTRIL) 20 MG tablet Take 1  tablet (20 mg total) by mouth daily.  . pantoprazole (PROTONIX) 40 MG tablet Take 1 tablet (40 mg total) by mouth at bedtime.  . senna-docusate (SENOKOT-S) 8.6-50 MG tablet Take 1 tablet by mouth at bedtime.  . tamsulosin (FLOMAX) 0.4 MG CAPS capsule Take 1 capsule (0.4 mg total) by mouth daily after supper.    ROS:  Review of Systems  Constitutional: Negative for fever and weight loss.  Respiratory: Negative for shortness of breath.   Cardiovascular: Negative for chest pain.  Neurological: Negative for dizziness, loss of consciousness and headaches.     Objective:   Today's Vitals: BP 118/78   Pulse (!) 59 Comment: radial  Temp (!) 97 F (36.1 C) (Temporal)   Ht 6' (1.829 m)   BMI 35.26 kg/m  Vitals with BMI 04/10/2021 04/04/2021 03/12/2021  Height 6' 0" 6' 0" -  Weight (No Data) 260 lbs -  BMI - 36.64 -  Systolic 403 99 474  Diastolic 78 61 69  Pulse 59 64 56     Physical Exam Vitals reviewed.  Constitutional:      Appearance: Normal appearance.  HENT:     Head: Normocephalic and atraumatic.  Cardiovascular:     Rate and Rhythm: Normal rate and regular rhythm.  Pulmonary:     Effort: Pulmonary effort is normal.     Breath sounds: Normal breath sounds.  Musculoskeletal:     Cervical back: Neck supple.  Skin:    General: Skin is warm and dry.  Neurological:     Mental Status: He is alert and oriented to person, place, and time.     Sensory: Sensory deficit (left side, decreased sensation) present.     Motor: Weakness (left side) present.     Gait: Gait abnormal (wheelchair bound).  Psychiatric:        Mood and Affect: Mood normal.        Behavior: Behavior normal.        Thought Content: Thought content normal.        Judgment: Judgment normal.          Assessment and Plan   1. History of CVA (cerebrovascular accident)   2. Colon cancer screening   3. Essential hypertension   4. Acquired hypothyroidism      Plan: 1.  Patient will continue on his  Pradaxa.  He will also follow-up with neurology as scheduled in July.  He will continue to work with physical therapy on an outpatient basis.  We will check some blood work today for further evaluation.  He  will continue on all of his other medication as prescribed as well. 2.  We will refer him to gastroenterologist to discuss all colon cancer screening options available to him. 3.  Blood pressure well controlled on current regimen he will continue taking medication as prescribed. 4.  We will check TSH level today, and he will continue on levothyroxine for now.   Tests ordered Orders Placed This Encounter  Procedures  . TSH  . CMP with eGFR([facility])  . CBC  . Ambulatory referral to Gastroenterology      No orders of the defined types were placed in this encounter.   Patient to follow-up in 3 months or sooner as needed.  Ailene Ards, NP

## 2021-04-10 NOTE — Telephone Encounter (Signed)
Ordering referral to GI (Dr. Sydell Axon) for colon cancer screening discussion

## 2021-04-10 NOTE — Telephone Encounter (Signed)
Patient needs for [place] to resend these [facility]. They were sent by hospital doctor and pharmacy stated they needed approval from [place].   dabigatran (PRADAXA) 150 MG CAPS capsule   levETIRAcetam (KEPPRA) 500 MG tablet   lisinopril (ZESTRIL) 20 MG tablet   pantoprazole (PROTONIX) 40 MG tablet   tamsulosin (FLOMAX) 0.4 MG CAPS capsule

## 2021-04-10 NOTE — Telephone Encounter (Signed)
Ok will send today

## 2021-04-10 NOTE — Telephone Encounter (Signed)
[facility]

## 2021-04-11 DIAGNOSIS — N183 Chronic kidney disease, stage 3 unspecified: Secondary | ICD-10-CM | POA: Diagnosis not present

## 2021-04-11 DIAGNOSIS — E782 Mixed hyperlipidemia: Secondary | ICD-10-CM | POA: Diagnosis not present

## 2021-04-11 DIAGNOSIS — R7303 Prediabetes: Secondary | ICD-10-CM | POA: Diagnosis not present

## 2021-04-11 DIAGNOSIS — Z86718 Personal history of other venous thrombosis and embolism: Secondary | ICD-10-CM | POA: Diagnosis not present

## 2021-04-11 DIAGNOSIS — I129 Hypertensive chronic kidney disease with stage 1 through stage 4 chronic kidney disease, or unspecified chronic kidney disease: Secondary | ICD-10-CM | POA: Diagnosis not present

## 2021-04-11 DIAGNOSIS — E041 Nontoxic single thyroid nodule: Secondary | ICD-10-CM | POA: Diagnosis not present

## 2021-04-11 DIAGNOSIS — Z86711 Personal history of pulmonary embolism: Secondary | ICD-10-CM | POA: Diagnosis not present

## 2021-04-11 DIAGNOSIS — Z7982 Long term (current) use of aspirin: Secondary | ICD-10-CM | POA: Diagnosis not present

## 2021-04-11 DIAGNOSIS — E039 Hypothyroidism, unspecified: Secondary | ICD-10-CM | POA: Diagnosis not present

## 2021-04-11 DIAGNOSIS — I69354 Hemiplegia and hemiparesis following cerebral infarction affecting left non-dominant side: Secondary | ICD-10-CM | POA: Diagnosis not present

## 2021-04-11 LAB — CBC
HCT: 38.4 % — ABNORMAL LOW (ref 38.5–50.0)
Hemoglobin: 13 g/dL — ABNORMAL LOW (ref 13.2–17.1)
MCH: 28.4 pg (ref 27.0–33.0)
MCHC: 33.9 g/dL (ref 32.0–36.0)
MCV: 83.8 fL (ref 80.0–100.0)
MPV: 10.6 fL (ref 7.5–12.5)
Platelets: 184 10*3/uL (ref 140–400)
RBC: 4.58 10*6/uL (ref 4.20–5.80)
RDW: 13 % (ref 11.0–15.0)
WBC: 4.2 10*3/uL (ref 3.8–10.8)

## 2021-04-11 LAB — COMPLETE METABOLIC PANEL WITH GFR
AG Ratio: 1.5 (calc) (ref 1.0–2.5)
ALT: 45 U/L (ref 9–46)
AST: 24 U/L (ref 10–35)
Albumin: 4.2 g/dL (ref 3.6–5.1)
Alkaline phosphatase (APISO): 116 U/L (ref 35–144)
BUN/Creatinine Ratio: 11 (calc) (ref 6–22)
BUN: 20 mg/dL (ref 7–25)
CO2: 29 mmol/L (ref 20–32)
Calcium: 9.9 mg/dL (ref 8.6–10.3)
Chloride: 104 mmol/L (ref 98–110)
Creat: 1.76 mg/dL — ABNORMAL HIGH (ref 0.70–1.33)
GFR, Est African American: 48 mL/min/{1.73_m2} — ABNORMAL LOW (ref >=60)
GFR, Est Non African American: 42 mL/min/{1.73_m2} — ABNORMAL LOW (ref >=60)
Globulin: 2.8 g/dL (calc) (ref 1.9–3.7)
Glucose, Bld: 106 mg/dL (ref 65–139)
Potassium: 3.7 mmol/L (ref 3.5–5.3)
Sodium: 141 mmol/L (ref 135–146)
Total Bilirubin: 0.7 mg/dL (ref 0.2–1.2)
Total Protein: 7 g/dL (ref 6.1–8.1)

## 2021-04-11 LAB — TSH: TSH: 1.92 mIU/L (ref 0.40–4.50)

## 2021-04-12 DIAGNOSIS — I63511 Cerebral infarction due to unspecified occlusion or stenosis of right middle cerebral artery: Secondary | ICD-10-CM | POA: Diagnosis not present

## 2021-04-12 DIAGNOSIS — I69391 Dysphagia following cerebral infarction: Secondary | ICD-10-CM | POA: Diagnosis not present

## 2021-04-12 DIAGNOSIS — R350 Frequency of micturition: Secondary | ICD-10-CM | POA: Diagnosis not present

## 2021-04-12 DIAGNOSIS — L8990 Pressure ulcer of unspecified site, unspecified stage: Secondary | ICD-10-CM | POA: Diagnosis not present

## 2021-04-12 DIAGNOSIS — I952 Hypotension due to drugs: Secondary | ICD-10-CM | POA: Diagnosis not present

## 2021-04-15 DIAGNOSIS — E039 Hypothyroidism, unspecified: Secondary | ICD-10-CM | POA: Diagnosis not present

## 2021-04-15 DIAGNOSIS — Z7982 Long term (current) use of aspirin: Secondary | ICD-10-CM | POA: Diagnosis not present

## 2021-04-15 DIAGNOSIS — I129 Hypertensive chronic kidney disease with stage 1 through stage 4 chronic kidney disease, or unspecified chronic kidney disease: Secondary | ICD-10-CM | POA: Diagnosis not present

## 2021-04-15 DIAGNOSIS — E041 Nontoxic single thyroid nodule: Secondary | ICD-10-CM | POA: Diagnosis not present

## 2021-04-15 DIAGNOSIS — Z86711 Personal history of pulmonary embolism: Secondary | ICD-10-CM | POA: Diagnosis not present

## 2021-04-15 DIAGNOSIS — I69354 Hemiplegia and hemiparesis following cerebral infarction affecting left non-dominant side: Secondary | ICD-10-CM | POA: Diagnosis not present

## 2021-04-15 DIAGNOSIS — N183 Chronic kidney disease, stage 3 unspecified: Secondary | ICD-10-CM | POA: Diagnosis not present

## 2021-04-15 DIAGNOSIS — Z86718 Personal history of other venous thrombosis and embolism: Secondary | ICD-10-CM | POA: Diagnosis not present

## 2021-04-15 DIAGNOSIS — E782 Mixed hyperlipidemia: Secondary | ICD-10-CM | POA: Diagnosis not present

## 2021-04-15 DIAGNOSIS — R7303 Prediabetes: Secondary | ICD-10-CM | POA: Diagnosis not present

## 2021-04-17 DIAGNOSIS — I69354 Hemiplegia and hemiparesis following cerebral infarction affecting left non-dominant side: Secondary | ICD-10-CM | POA: Diagnosis not present

## 2021-04-17 DIAGNOSIS — Z86718 Personal history of other venous thrombosis and embolism: Secondary | ICD-10-CM | POA: Diagnosis not present

## 2021-04-17 DIAGNOSIS — N183 Chronic kidney disease, stage 3 unspecified: Secondary | ICD-10-CM | POA: Diagnosis not present

## 2021-04-17 DIAGNOSIS — R7303 Prediabetes: Secondary | ICD-10-CM | POA: Diagnosis not present

## 2021-04-17 DIAGNOSIS — Z86711 Personal history of pulmonary embolism: Secondary | ICD-10-CM | POA: Diagnosis not present

## 2021-04-17 DIAGNOSIS — E782 Mixed hyperlipidemia: Secondary | ICD-10-CM | POA: Diagnosis not present

## 2021-04-17 DIAGNOSIS — E039 Hypothyroidism, unspecified: Secondary | ICD-10-CM | POA: Diagnosis not present

## 2021-04-17 DIAGNOSIS — I129 Hypertensive chronic kidney disease with stage 1 through stage 4 chronic kidney disease, or unspecified chronic kidney disease: Secondary | ICD-10-CM | POA: Diagnosis not present

## 2021-04-17 DIAGNOSIS — Z7982 Long term (current) use of aspirin: Secondary | ICD-10-CM | POA: Diagnosis not present

## 2021-04-17 DIAGNOSIS — E041 Nontoxic single thyroid nodule: Secondary | ICD-10-CM | POA: Diagnosis not present

## 2021-04-18 ENCOUNTER — Encounter: Payer: Self-pay | Admitting: Registered Nurse

## 2021-04-18 DIAGNOSIS — Z86718 Personal history of other venous thrombosis and embolism: Secondary | ICD-10-CM | POA: Diagnosis not present

## 2021-04-18 DIAGNOSIS — I129 Hypertensive chronic kidney disease with stage 1 through stage 4 chronic kidney disease, or unspecified chronic kidney disease: Secondary | ICD-10-CM | POA: Diagnosis not present

## 2021-04-18 DIAGNOSIS — R7303 Prediabetes: Secondary | ICD-10-CM | POA: Diagnosis not present

## 2021-04-18 DIAGNOSIS — E782 Mixed hyperlipidemia: Secondary | ICD-10-CM | POA: Diagnosis not present

## 2021-04-18 DIAGNOSIS — N183 Chronic kidney disease, stage 3 unspecified: Secondary | ICD-10-CM | POA: Diagnosis not present

## 2021-04-18 DIAGNOSIS — E039 Hypothyroidism, unspecified: Secondary | ICD-10-CM | POA: Diagnosis not present

## 2021-04-18 DIAGNOSIS — Z7982 Long term (current) use of aspirin: Secondary | ICD-10-CM | POA: Diagnosis not present

## 2021-04-18 DIAGNOSIS — E041 Nontoxic single thyroid nodule: Secondary | ICD-10-CM | POA: Diagnosis not present

## 2021-04-18 DIAGNOSIS — Z86711 Personal history of pulmonary embolism: Secondary | ICD-10-CM | POA: Diagnosis not present

## 2021-04-18 DIAGNOSIS — I69354 Hemiplegia and hemiparesis following cerebral infarction affecting left non-dominant side: Secondary | ICD-10-CM | POA: Diagnosis not present

## 2021-04-21 DIAGNOSIS — E782 Mixed hyperlipidemia: Secondary | ICD-10-CM | POA: Diagnosis not present

## 2021-04-21 DIAGNOSIS — E039 Hypothyroidism, unspecified: Secondary | ICD-10-CM | POA: Diagnosis not present

## 2021-04-21 DIAGNOSIS — E041 Nontoxic single thyroid nodule: Secondary | ICD-10-CM | POA: Diagnosis not present

## 2021-04-21 DIAGNOSIS — N183 Chronic kidney disease, stage 3 unspecified: Secondary | ICD-10-CM | POA: Diagnosis not present

## 2021-04-21 DIAGNOSIS — Z86718 Personal history of other venous thrombosis and embolism: Secondary | ICD-10-CM | POA: Diagnosis not present

## 2021-04-21 DIAGNOSIS — I129 Hypertensive chronic kidney disease with stage 1 through stage 4 chronic kidney disease, or unspecified chronic kidney disease: Secondary | ICD-10-CM | POA: Diagnosis not present

## 2021-04-21 DIAGNOSIS — R7303 Prediabetes: Secondary | ICD-10-CM | POA: Diagnosis not present

## 2021-04-21 DIAGNOSIS — I69354 Hemiplegia and hemiparesis following cerebral infarction affecting left non-dominant side: Secondary | ICD-10-CM | POA: Diagnosis not present

## 2021-04-21 DIAGNOSIS — Z7982 Long term (current) use of aspirin: Secondary | ICD-10-CM | POA: Diagnosis not present

## 2021-04-21 DIAGNOSIS — Z86711 Personal history of pulmonary embolism: Secondary | ICD-10-CM | POA: Diagnosis not present

## 2021-04-24 ENCOUNTER — Other Ambulatory Visit: Payer: Self-pay

## 2021-04-24 ENCOUNTER — Telehealth: Payer: Self-pay | Admitting: *Deleted

## 2021-04-24 ENCOUNTER — Ambulatory Visit (INDEPENDENT_AMBULATORY_CARE_PROVIDER_SITE_OTHER): Payer: Medicare Other | Admitting: Internal Medicine

## 2021-04-24 ENCOUNTER — Encounter (INDEPENDENT_AMBULATORY_CARE_PROVIDER_SITE_OTHER): Payer: Self-pay | Admitting: Internal Medicine

## 2021-04-24 VITALS — BP 133/76 | HR 88 | Temp 97.1°F | Ht 72.0 in | Wt 236.0 lb

## 2021-04-24 DIAGNOSIS — N390 Urinary tract infection, site not specified: Secondary | ICD-10-CM | POA: Diagnosis not present

## 2021-04-24 MED ORDER — CIPROFLOXACIN HCL 500 MG PO TABS: 500.0000 mg | ORAL_TABLET | Freq: Two times a day (BID) | ORAL | 0 refills | Status: DC

## 2021-04-24 NOTE — Progress Notes (Signed)
[facility]. Have been dropping the ball on his appt's, RN ,SW, & PT/ OT have some things to fix. He is in a Agricultural [employer]. Ordered a custom w/c . Will need to contact them and get a new POC. Need f/u  With Dr Gretchen Short, neurologist which is scheduled.Marland Kitchen

## 2021-04-24 NOTE — Telephone Encounter (Signed)
Matthew Wise sister called because he has never received his wheelchair and it has been a month or more since he was discharged from inpt rehab. A message will be sent to MSW [name] who oversaw his discharge.

## 2021-04-24 NOTE — Progress Notes (Signed)
Metrics: Intervention Frequency ACO  Documented Smoking Status Yearly  Screened one or more times in 24 months  Cessation Counseling or  Active cessation medication Past 24 months  Past 24 months   Guideline developer: UpToDate (See UpToDate for funding source) Date Released: 2014       Wellness Office Visit  Subjective:  Patient ID: Matthew Wise, male    DOB: 01/13/1961  Age: 60 y.o. MRN: [id number]  CC: Increased frequency of micturition. HPI  This patient comes in for an acute visit because he had a home "UTI" test that was positive for possible urine infection.  The patient describes increased frequency of micturition but does not describe dysuria.  He denies any fever. He has a history of cerebrovascular disease which is quite extensive.  He also has chronic kidney disease.  He is not diabetic. Past Medical History:  Diagnosis Date  . Acute CVA (cerebrovascular accident) ([place]) 09/09/2020  . Chronic anticoagulation 11/14/2014  . CKD (chronic kidney disease) stage 3, GFR 30-59 ml/min (HCC) 10/24/2019  . CKD (chronic kidney disease) stage 3, GFR 30-59 ml/min (HCC) 10/24/2019  . Clotting disorder ([place])   . 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 DVT (deep venous thrombosis) ([place]) 06/15/2011  . DVT of leg (deep venous thrombosis) (HCC)    rt. leg  . Essential hypertension 11/28/2020  . H/O PE (pulmonary embolism) 06/15/2011  . Hyperlipidemia 11/28/2020  . 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 Prediabetes 10/24/2019  . Thyroid nodule 01/25/2021   Past Surgical History:  Procedure Laterality Date  . COLONOSCOPY WITH PROPOFOL N/A 10/29/2015   incomplete due to redundant colon. two simple tubular adenomas removed. patient was advised to go to [facility] for colonoscopy but he did not keep appt.  . IR CT HEAD LTD  02/02/2021  . IR PERCUTANEOUS ART THROMBECTOMY/INFUSION INTRACRANIAL INC  DIAG ANGIO  02/02/2021  . RADIOLOGY WITH ANESTHESIA N/A 02/02/2021   Procedure: IR WITH ANESTHESIA;  Surgeon: Luanne Bras, MD;  Location: [facility];  Service: Radiology;  Laterality: N/A;  . TEE WITHOUT CARDIOVERSION N/A 09/10/2020   Procedure: TRANSESOPHAGEAL ECHOCARDIOGRAM (TEE) WITH PROPOFOL;  Surgeon: Satira Sark, MD;  Location: [facility];  Service: Cardiovascular;  Laterality: N/A;     Family History  Problem Relation Age of Onset  . COPD Father   . Stroke Brother   . Pulmonary embolism Sister   . Throat cancer Brother   . Arthritis Mother   . Diabetes Mother   . Stroke Mother   . Hypertension Brother   . Diabetes Brother   . Arthritis Brother   . Diabetes Sister   . Colon cancer Neg Hx     Social History   Social History Narrative   Lives with son   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s   Mows lawns some   Social History   Tobacco Use  . Smoking status: Current Every Day Smoker    Packs/day: 1.00    Years: 30.00    Pack years: 30.00    Types: Cigarettes    Start date: 12/01/1983  . Smokeless tobacco: Never Used  Substance Use Topics  . Alcohol use: Not Currently    Alcohol/week: 3.0 standard drinks    Types: 3 Cans of beer per week    Comment: has not drank sice 11/27/20    Current Meds  Medication Sig  . acetaminophen (TYLENOL)  325 MG tablet Take 2 tablets (650 mg total) by mouth every 4 (four) hours as needed for mild pain (or temp > 37.5 C (99.5 F)).  Marland Kitchen amLODipine (NORVASC) 5 MG tablet Take 1 tablet by mouth daily.  Marland Kitchen aspirin EC 81 MG EC tablet Take 1 tablet (81 mg total) by mouth daily. Swallow whole.  Marland Kitchen atorvastatin (LIPITOR) 40 MG tablet Take 1 tablet (40 mg total) by mouth daily.  . ciprofloxacin (CIPRO) 500 MG tablet Take 1 tablet (500 mg total) by mouth 2 (two) times daily.  . dabigatran (PRADAXA) 150 MG CAPS capsule Take 1 capsule (150 mg total) by mouth every 12 (twelve) hours.  . hydrALAZINE (APRESOLINE) 50 MG tablet Take 1 tablet (50 mg  total) by mouth every 8 (eight) hours.  . levETIRAcetam (KEPPRA) 500 MG tablet Take 1 tablet (500 mg total) by mouth 2 (two) times daily.  Marland Kitchen levothyroxine (SYNTHROID) 25 MCG tablet Take 1 tablet (25 mcg total) by mouth daily.  Marland Kitchen lisinopril (ZESTRIL) 20 MG tablet Take 1 tablet (20 mg total) by mouth daily.  . pantoprazole (PROTONIX) 40 MG tablet Take 1 tablet (40 mg total) by mouth at bedtime.  . senna-docusate (SENOKOT-S) 8.6-50 MG tablet Take 1 tablet by mouth at bedtime.  . tamsulosin (FLOMAX) 0.4 MG CAPS capsule Take 1 capsule (0.4 mg total) by mouth daily after supper.     [place] Office Visit from 04/10/2021 in [facility]  PHQ-9 Total Score 0      Objective:   Today's Vitals: BP 133/76   Pulse 88   Temp (!) 97.1 F (36.2 C) (Temporal)   Ht 6' (1.829 m)   Wt 236 lb (107 kg)   SpO2 96%   BMI 32.01 kg/m  Vitals with BMI 04/24/2021 04/10/2021 04/04/2021  Height 6\' 0"  6\' 0"  6\' 0"   Weight 236 lbs (No Data) 260 lbs  BMI 32 - 92.01  Systolic 007 121 99  Diastolic 76 78 61  Pulse 88 59 64     Physical Exam  He is in a wheelchair.  He is afebrile.  Blood pressure is acceptable.  He is alert and orientated and his speech is normal.     Assessment   1. Urinary tract infection without hematuria, site unspecified       Tests ordered Orders Placed This Encounter  Procedures  . Urinalysis w microscopic + reflex cultur     Plan: 1. We will send the urine for culture.  Empirically I will treat him with ciprofloxacin and we will await the urine culture.   Meds ordered this encounter  Medications  . ciprofloxacin (CIPRO) 500 MG tablet    Sig: Take 1 tablet (500 mg total) by mouth 2 (two) times daily.    Dispense:  10 tablet    Refill:  0    Kelden Lavallee Luther Parody, MD

## 2021-04-25 DIAGNOSIS — I129 Hypertensive chronic kidney disease with stage 1 through stage 4 chronic kidney disease, or unspecified chronic kidney disease: Secondary | ICD-10-CM | POA: Diagnosis not present

## 2021-04-25 DIAGNOSIS — Z86711 Personal history of pulmonary embolism: Secondary | ICD-10-CM | POA: Diagnosis not present

## 2021-04-25 DIAGNOSIS — E039 Hypothyroidism, unspecified: Secondary | ICD-10-CM | POA: Diagnosis not present

## 2021-04-25 DIAGNOSIS — I69354 Hemiplegia and hemiparesis following cerebral infarction affecting left non-dominant side: Secondary | ICD-10-CM | POA: Diagnosis not present

## 2021-04-25 DIAGNOSIS — E041 Nontoxic single thyroid nodule: Secondary | ICD-10-CM | POA: Diagnosis not present

## 2021-04-25 DIAGNOSIS — Z7982 Long term (current) use of aspirin: Secondary | ICD-10-CM | POA: Diagnosis not present

## 2021-04-25 DIAGNOSIS — N183 Chronic kidney disease, stage 3 unspecified: Secondary | ICD-10-CM | POA: Diagnosis not present

## 2021-04-25 DIAGNOSIS — R7303 Prediabetes: Secondary | ICD-10-CM | POA: Diagnosis not present

## 2021-04-25 DIAGNOSIS — Z86718 Personal history of other venous thrombosis and embolism: Secondary | ICD-10-CM | POA: Diagnosis not present

## 2021-04-25 DIAGNOSIS — E782 Mixed hyperlipidemia: Secondary | ICD-10-CM | POA: Diagnosis not present

## 2021-04-25 LAB — URINALYSIS W MICROSCOPIC + REFLEX CULTURE
Bacteria, UA: NONE SEEN /HPF
Bilirubin Urine: NEGATIVE
Glucose, UA: NEGATIVE
Hgb urine dipstick: NEGATIVE
Hyaline Cast: NONE SEEN /LPF
Ketones, ur: NEGATIVE
Leukocyte Esterase: NEGATIVE
Nitrites, Initial: NEGATIVE
Protein, ur: NEGATIVE
RBC / HPF: NONE SEEN /HPF (ref 0–2)
Specific Gravity, Urine: 1.013 (ref 1.001–1.035)
Squamous Epithelial / HPF: NONE SEEN /HPF (ref <=5)
WBC, UA: NONE SEEN /HPF (ref 0–5)
pH: 5.5 (ref 5.0–8.0)

## 2021-04-25 LAB — NO CULTURE INDICATED

## 2021-04-29 NOTE — Progress Notes (Signed)
Please let the patient know that urine does not show evidence of urine infection whatsoever.  However, he should most likely have finished the course of antibiotics and if he is not feeling better, he should let us know.

## 2021-04-29 NOTE — Progress Notes (Signed)
Pt was called and given all the results. So he did complete the medications . So far no problems. Thank you for helping him from mother.

## 2021-04-30 DIAGNOSIS — E039 Hypothyroidism, unspecified: Secondary | ICD-10-CM | POA: Diagnosis not present

## 2021-04-30 DIAGNOSIS — Z86711 Personal history of pulmonary embolism: Secondary | ICD-10-CM | POA: Diagnosis not present

## 2021-04-30 DIAGNOSIS — Z86718 Personal history of other venous thrombosis and embolism: Secondary | ICD-10-CM | POA: Diagnosis not present

## 2021-04-30 DIAGNOSIS — R7303 Prediabetes: Secondary | ICD-10-CM | POA: Diagnosis not present

## 2021-04-30 DIAGNOSIS — Z7982 Long term (current) use of aspirin: Secondary | ICD-10-CM | POA: Diagnosis not present

## 2021-04-30 DIAGNOSIS — N183 Chronic kidney disease, stage 3 unspecified: Secondary | ICD-10-CM | POA: Diagnosis not present

## 2021-04-30 DIAGNOSIS — I69354 Hemiplegia and hemiparesis following cerebral infarction affecting left non-dominant side: Secondary | ICD-10-CM | POA: Diagnosis not present

## 2021-04-30 DIAGNOSIS — E041 Nontoxic single thyroid nodule: Secondary | ICD-10-CM | POA: Diagnosis not present

## 2021-04-30 DIAGNOSIS — I129 Hypertensive chronic kidney disease with stage 1 through stage 4 chronic kidney disease, or unspecified chronic kidney disease: Secondary | ICD-10-CM | POA: Diagnosis not present

## 2021-04-30 DIAGNOSIS — E782 Mixed hyperlipidemia: Secondary | ICD-10-CM | POA: Diagnosis not present

## 2021-05-01 ENCOUNTER — Encounter: Payer: Self-pay | Admitting: Internal Medicine

## 2021-05-01 DIAGNOSIS — E041 Nontoxic single thyroid nodule: Secondary | ICD-10-CM | POA: Diagnosis not present

## 2021-05-01 DIAGNOSIS — E782 Mixed hyperlipidemia: Secondary | ICD-10-CM | POA: Diagnosis not present

## 2021-05-01 DIAGNOSIS — Z7982 Long term (current) use of aspirin: Secondary | ICD-10-CM | POA: Diagnosis not present

## 2021-05-01 DIAGNOSIS — Z86711 Personal history of pulmonary embolism: Secondary | ICD-10-CM | POA: Diagnosis not present

## 2021-05-01 DIAGNOSIS — Z86718 Personal history of other venous thrombosis and embolism: Secondary | ICD-10-CM | POA: Diagnosis not present

## 2021-05-01 DIAGNOSIS — R7303 Prediabetes: Secondary | ICD-10-CM | POA: Diagnosis not present

## 2021-05-01 DIAGNOSIS — N183 Chronic kidney disease, stage 3 unspecified: Secondary | ICD-10-CM | POA: Diagnosis not present

## 2021-05-01 DIAGNOSIS — E039 Hypothyroidism, unspecified: Secondary | ICD-10-CM | POA: Diagnosis not present

## 2021-05-01 DIAGNOSIS — I69354 Hemiplegia and hemiparesis following cerebral infarction affecting left non-dominant side: Secondary | ICD-10-CM | POA: Diagnosis not present

## 2021-05-01 DIAGNOSIS — I129 Hypertensive chronic kidney disease with stage 1 through stage 4 chronic kidney disease, or unspecified chronic kidney disease: Secondary | ICD-10-CM | POA: Diagnosis not present

## 2021-05-06 DIAGNOSIS — E041 Nontoxic single thyroid nodule: Secondary | ICD-10-CM | POA: Diagnosis not present

## 2021-05-06 DIAGNOSIS — I69354 Hemiplegia and hemiparesis following cerebral infarction affecting left non-dominant side: Secondary | ICD-10-CM | POA: Diagnosis not present

## 2021-05-06 DIAGNOSIS — Z86718 Personal history of other venous thrombosis and embolism: Secondary | ICD-10-CM | POA: Diagnosis not present

## 2021-05-06 DIAGNOSIS — E039 Hypothyroidism, unspecified: Secondary | ICD-10-CM | POA: Diagnosis not present

## 2021-05-06 DIAGNOSIS — E782 Mixed hyperlipidemia: Secondary | ICD-10-CM | POA: Diagnosis not present

## 2021-05-06 DIAGNOSIS — I129 Hypertensive chronic kidney disease with stage 1 through stage 4 chronic kidney disease, or unspecified chronic kidney disease: Secondary | ICD-10-CM | POA: Diagnosis not present

## 2021-05-06 DIAGNOSIS — R7303 Prediabetes: Secondary | ICD-10-CM | POA: Diagnosis not present

## 2021-05-06 DIAGNOSIS — Z86711 Personal history of pulmonary embolism: Secondary | ICD-10-CM | POA: Diagnosis not present

## 2021-05-06 DIAGNOSIS — N183 Chronic kidney disease, stage 3 unspecified: Secondary | ICD-10-CM | POA: Diagnosis not present

## 2021-05-06 DIAGNOSIS — Z7982 Long term (current) use of aspirin: Secondary | ICD-10-CM | POA: Diagnosis not present

## 2021-05-08 ENCOUNTER — Telehealth (INDEPENDENT_AMBULATORY_CARE_PROVIDER_SITE_OTHER): Payer: Self-pay

## 2021-05-08 ENCOUNTER — Other Ambulatory Visit (INDEPENDENT_AMBULATORY_CARE_PROVIDER_SITE_OTHER): Payer: Self-pay | Admitting: Internal Medicine

## 2021-05-08 MED ORDER — HYDRALAZINE HCL 50 MG PO TABS: 50.0000 mg | ORAL_TABLET | Freq: Three times a day (TID) | ORAL | 3 refills | Status: DC

## 2021-05-08 NOTE — Telephone Encounter (Signed)
Called sister Deneise Lever back to let her know that that the [facility]. Deneise Lever verbalized an understanding and thanked [place].

## 2021-05-08 NOTE — Telephone Encounter (Signed)
Wife called and stated that the patient needs a [facility]ill of the [facility] [place]:  Hydralazine (Apresoline) 50 Mg Tablet  Last filled 03/11/2021, # 33 with 0 refills  Wife stated that the pharmacy only gave her 15 tablets and they have been out of this medication.

## 2021-05-09 ENCOUNTER — Other Ambulatory Visit: Payer: Self-pay

## 2021-05-09 ENCOUNTER — Encounter: Payer: Self-pay | Admitting: Physical Medicine & Rehabilitation

## 2021-05-09 ENCOUNTER — Encounter: Payer: Medicare Other | Attending: Registered Nurse | Admitting: Physical Medicine & Rehabilitation

## 2021-05-09 VITALS — BP 94/62 | HR 85 | Temp 98.5°F

## 2021-05-09 DIAGNOSIS — G8110 Spastic hemiplegia affecting unspecified side: Secondary | ICD-10-CM | POA: Insufficient documentation

## 2021-05-09 NOTE — Progress Notes (Signed)
Subjective:    Patient ID: Matthew Wise, male    DOB: 14-Jan-1961, 60 y.o.   MRN: [id number] 60 y.o. right-handed male with history of pulmonary emboli maintained on Eliquis, hypertension, CKD stage III, hyperlipidemia, prediabetes, sickle cell disease, tobacco abuse as well as history of right MCA territory infarction admission 01/21/2019 until 01/27/2021 and discharged home ambulating 150 feet rolling walker.  Patient lives alone 1 level home 3 steps to entry.  Independent with assistive device.  He has a sister who checks on him regularly.  Presented 02/03/2021 with left hemiparesis, subacute onset.  Cranial CT scan unremarkable for acute intracranial process.  Suspect acute infarct of the right frontal parietal lobe posterior superior to chronic infarct with probable involvement of lateral precentral and postcentral gyri.  Patient did not receive TPA.  CT angiogram of head and neck showed abrupt occlusion of the right M1 segment new as compared to prior CTA of 01/25/2021.  Acute core infarct of the posterior right frontal parietal region largely corresponding with a completed infarct as seen on prior MRI.  EEG evidence of focal seizure arising from the right posterior quadrant during which patient was noted to have left upper extremity twitching.  Additionally there was cortical dysfunction in the right frontal temporal region likely due to underlying stroke.  Most recent echocardiogram with ejection fraction of 55 to 60%.  Admission chemistries unremarkable except creatinine 1.83 urine drug screen negative hemoglobin A1c 5.9 alcohol negative.  Patient underwent arteriogram with revascularization of right MCA M1 segment per interventional radiology.  Latest follow-up MRI showed large acute right MCA territory infarct with petechial hemorrhage and minimal leftward midline shift as well as a small acute left cerebellar infarct new small acute early subacute infarct in left occipital left parietal left frontal  lobes.  Bilateral extremity Dopplers finding consistent with age-indeterminate DVT involving the right common femoral vein and right femoral vein as well as left femoral vein and left popliteal vein, left common femoral and SFA junction.  Patient was placed on intravenous heparin remained on low-dose aspirin and Pradaxa was added.  He had been loaded with Keppra for suspected seizure.  Hospital course further complicated by dysphagia currently on a mechanical soft diet.   Admit date: 02/12/2021 Discharge date: 03/12/2021  HPI 60 year old male with left hemiparesis following right MCA distribution infarct onset 02/03/2021 he had a previous CVA milder, 01/21/21 Did not get permanent WC, just has loaner chair, he was seen by his primary care physician for UTI 2 weeks ago and a new wheelchair was reordered.  Contact the social worker at acute rehab inpatient who is checking with physical therapy to see what happened with the original order  PT, OT just finished last week SLP finished 2 weeks  Still needs help with dressing and bathing  Stays with his sister, she is able to transfer pt with gait belt  Has transportation to OP therapy , would like to go  Patient's son his driven him to the office today.  Patient is no longer able to drive.  He was on disability prior to his stroke. Pain Inventory Average Pain 0 Pain Right Now 0 My pain is  no pain  LOCATION OF PAIN  no pain  BOWEL Number of stools per week: 4-5 Oral laxative use Yes  Type of laxative senokot Enema or suppository use No  History of colostomy No  Incontinent No   BLADDER Normal In and out cath, frequency n/a Able to self cath Yes  Bladder incontinence No  Frequent urination No  Leakage with coughing No  Difficulty starting stream No  Incomplete bladder emptying No    Mobility ability to climb steps?  yes do you drive?  no use a wheelchair needs help with transfers Do you have any goals in this area?   yes  Function disabled: date disabled not sure I need assistance with the following:  dressing, bathing, toileting, meal prep, household duties, and shopping  Neuro/Psych No problems in this area  Prior Studies N/a  Physicians involved in your care N/a   Family History  Problem Relation Age of Onset   COPD Father    Stroke Brother    Pulmonary embolism Sister    Throat cancer Brother    Arthritis Mother    Diabetes Mother    Stroke Mother    Hypertension Brother    Diabetes Brother    Arthritis Brother    Diabetes Sister    Colon cancer Neg Hx    Social History   Socioeconomic History   Marital status: Divorced    Spouse name: Not on file   Number of children: 2   Years of education: 12   Highest education level: Not on file  Occupational History   Occupation: disability    Comment: DVT  Tobacco Use   Smoking status: Every Day    Packs/day: 1.00    Years: 30.00    Pack years: 30.00    Types: Cigarettes    Start date: 12/01/1983   Smokeless tobacco: Never  Vaping Use   Vaping Use: Never used  Substance and Sexual Activity   Alcohol use: Not Currently    Alcohol/week: 3.0 standard drinks    Types: 3 Cans of beer per week    Comment: has not drank sice 11/27/20   Drug use: Not Currently    Comment: REMOTE HISTORY, Quit in 2007   Sexual activity: Yes  Other Topics Concern   Not on file  Social History Narrative   Lives with son   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s   Mows lawns some   Social Determinants of Health   Financial Resource Strain: Not on file  Food Insecurity: Not on file  Transportation Needs: Not on file  Physical Activity: Not on file  Stress: Not on file  Social Connections: Not on file   Past Surgical History:  Procedure Laterality Date   COLONOSCOPY WITH PROPOFOL N/A 10/29/2015   incomplete due to redundant colon. two simple tubular adenomas removed. patient was advised to go to [facility] for colonoscopy but he did not keep  appt.   IR CT HEAD LTD  02/02/2021   IR PERCUTANEOUS ART THROMBECTOMY/INFUSION INTRACRANIAL INC DIAG ANGIO  02/02/2021   RADIOLOGY WITH ANESTHESIA N/A 02/02/2021   Procedure: IR WITH ANESTHESIA;  Surgeon: Luanne Bras, MD;  Location: [facility];  Service: Radiology;  Laterality: N/A;   TEE WITHOUT CARDIOVERSION N/A 09/10/2020   Procedure: TRANSESOPHAGEAL ECHOCARDIOGRAM (TEE) WITH PROPOFOL;  Surgeon: Satira Sark, MD;  Location: [facility];  Service: Cardiovascular;  Laterality: N/A;   Past Medical History:  Diagnosis Date   Acute CVA (cerebrovascular accident) ([place]) 09/09/2020   Chronic anticoagulation 11/14/2014   CKD (chronic kidney disease) stage 3, GFR 30-59 ml/min ([place]) 10/24/2019   CKD (chronic kidney disease) stage 3, GFR 30-59 ml/min ([place]) 10/24/2019   Clotting disorder ([place])    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   Prediabetes 10/24/2019   Thyroid nodule 01/25/2021   BP 94/62 (BP Location: Right Arm, Patient Position: Sitting)   Pulse 85   Temp 98.5 F (36.9 C) (Oral)   SpO2 96%   Opioid Risk Score:   Fall Risk Score:  `1  Depression screen PHQ 2/9  Depression screen [facility] 2/9 04/10/2021 11/20/2020 01/30/2020 12/05/2019 09/14/2017 09/28/2014  Decreased Interest 0 0 0 0 0 0  Down, Depressed, Hopeless 0 0 0 0 0 0  PHQ - 2 Score 0 0 0 0 0 0  Altered sleeping 0 0 - - - -  Tired, decreased energy 0 0 - - - -  Change in appetite 0 0 - - - -  Feeling bad or failure about yourself  0 0 - - - -  Trouble concentrating 0 0 - - - -  Moving slowly or fidgety/restless 0 0 - - - -  Suicidal thoughts 0 0 - - - -  PHQ-9 Score 0 0 - - - -  Difficult  doing work/chores Not difficult at all Not difficult at all - - - -  Some recent data might be hidden       Review of Systems  Constitutional: Negative.   HENT: Negative.    Eyes: Negative.   Respiratory: Negative.    Cardiovascular: Negative.   Gastrointestinal: Negative.   Endocrine: Negative.   Genitourinary: Negative.   Musculoskeletal: Negative.   Skin: Negative.   Allergic/Immunologic: Negative.   Neurological: Negative.   Hematological: Negative.   Psychiatric/Behavioral: Negative.        Objective:   Physical Exam Vitals and nursing note reviewed.  Constitutional:      Appearance: He is obese.  Eyes:     Extraocular Movements: Extraocular movements intact.     Conjunctiva/sclera: Conjunctivae normal.     Pupils: Pupils are equal, round, and reactive to light.  Neurological:     General: No focal deficit present.     Mental Status: He is alert and oriented to person, place, and time.     Cranial Nerves: No dysarthria.     Sensory: Sensory deficit present.     Motor: Weakness present.     Gait: Gait abnormal.     Comments: No evidence of aphasia Sensation absent to light touch in the left upper and left lower limb Motor strength is to minus at the left elbow flexor and extensor trace at the deltoid trace at the finger flexors 0 at the finger extensors Left lower limb to minus hip flexor 3 - knee extensor trace ankle plantar flexor 0 ankle dorsiflexor Tone MAS 2 in the left elbow flexors 1 in the finger flexors 1 in the hamstring and 2 in the gastrosoleus  Sit to stand is with moderate assistance tends to lean toward the left side  Psychiatric:        Mood and Affect: Mood normal.        Behavior: Behavior normal.         Assessment & Plan:   1.  Right MCA infarct with left hemiparesis still has moderately severe weakness on the left side with severe sensory loss.  He has completed both inpatient rehab as  well as home health therapy.  He would benefit from  additional outpatient therapy PT and OT.  We will make referral to [facility]. Patient will need a custom wheelchair to maximize independent mobility. Physical medicine rehab follow-up in 6 weeks

## 2021-05-09 NOTE — Patient Instructions (Signed)
[facility] should be in contact with you next week

## 2021-05-13 DIAGNOSIS — I69391 Dysphagia following cerebral infarction: Secondary | ICD-10-CM | POA: Diagnosis not present

## 2021-05-13 DIAGNOSIS — L8990 Pressure ulcer of unspecified site, unspecified stage: Secondary | ICD-10-CM | POA: Diagnosis not present

## 2021-05-13 DIAGNOSIS — I63511 Cerebral infarction due to unspecified occlusion or stenosis of right middle cerebral artery: Secondary | ICD-10-CM | POA: Diagnosis not present

## 2021-05-13 DIAGNOSIS — R350 Frequency of micturition: Secondary | ICD-10-CM | POA: Diagnosis not present

## 2021-05-13 DIAGNOSIS — I952 Hypotension due to drugs: Secondary | ICD-10-CM | POA: Diagnosis not present

## 2021-05-14 ENCOUNTER — Encounter (HOSPITAL_COMMUNITY): Payer: Self-pay

## 2021-05-14 ENCOUNTER — Ambulatory Visit (HOSPITAL_COMMUNITY): Payer: Medicare Other | Attending: Physical Medicine & Rehabilitation

## 2021-05-14 ENCOUNTER — Other Ambulatory Visit: Payer: Self-pay

## 2021-05-14 DIAGNOSIS — R2689 Other abnormalities of gait and mobility: Secondary | ICD-10-CM | POA: Insufficient documentation

## 2021-05-14 DIAGNOSIS — R262 Difficulty in walking, not elsewhere classified: Secondary | ICD-10-CM | POA: Diagnosis not present

## 2021-05-14 DIAGNOSIS — M25642 Stiffness of left hand, not elsewhere classified: Secondary | ICD-10-CM | POA: Diagnosis not present

## 2021-05-14 DIAGNOSIS — M6281 Muscle weakness (generalized): Secondary | ICD-10-CM | POA: Insufficient documentation

## 2021-05-14 DIAGNOSIS — M25622 Stiffness of left elbow, not elsewhere classified: Secondary | ICD-10-CM

## 2021-05-14 DIAGNOSIS — R208 Other disturbances of skin sensation: Secondary | ICD-10-CM | POA: Insufficient documentation

## 2021-05-14 DIAGNOSIS — R278 Other lack of coordination: Secondary | ICD-10-CM | POA: Insufficient documentation

## 2021-05-14 DIAGNOSIS — R29898 Other symptoms and signs involving the musculoskeletal system: Secondary | ICD-10-CM | POA: Diagnosis not present

## 2021-05-14 NOTE — Therapy (Addendum)
[place], [place], [address] Phone: [phone]   Fax:  [phone]  Occupational Therapy Evaluation  Patient Details  Name: Matthew Wise MRN: [id number] Date of Birth: 08/18/1961 Referring Provider (OT): Alysia Penna, MD   Encounter Date: 05/14/2021   OT End of Session - 05/14/21 1454     Visit Number 1    Number of Visits 24    Date for OT Re-Evaluation 08/06/21   mini reassess:06/11/21   Authorization Type [employer] medicare    Authorization Time Period 11/30/20-end of year. $30 copay. 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 two simple tubular adenomas removed. patient was advised to go to [facility] for colonoscopy but he did not keep appt.   IR CT HEAD LTD  02/02/2021   IR PERCUTANEOUS ART THROMBECTOMY/INFUSION INTRACRANIAL INC  DIAG ANGIO  02/02/2021   RADIOLOGY WITH ANESTHESIA N/A 02/02/2021   Procedure: IR WITH ANESTHESIA;  Surgeon: Luanne Bras, MD;  Location: [facility];  Service: Radiology;  Laterality: N/A;   TEE WITHOUT CARDIOVERSION N/A 09/10/2020   Procedure: TRANSESOPHAGEAL ECHOCARDIOGRAM (TEE) WITH PROPOFOL;  Surgeon: Satira Sark, MD;  Location: [facility];  Service: Cardiovascular;  Laterality: N/A;    There were no vitals filed for this visit.    05/14/21 1117  Symptoms/Limitations  Subjective  S: It hurts to sit in this chair.  Pertinent History Patient is a 60 y/o male s/p Right MCA infarct with left hemiparesis still has moderately severe weakness on the left side with severe sensory loss.  He has completed both inpatient rehab as well as home health therapy. Dr. Dianna Limbo has referred patient to occupational therapy for evaluation and treatment.  Patient Stated Goals to increase the use of his left UE.  Pain Assessment  Currently in Pain? No/denies     [facility] OT Assessment - 05/14/21 1119       Assessment   Medical Diagnosis S/P right MCA with left hemiparesis    Referring Provider (OT) Alysia Penna, MD    Onset Date/Surgical Date 02/02/21   experienced a CVA prior to this on 11/27/20 with no residual effects per pt.   Hand Dominance Right    Next MD Visit 06/20/21    Prior Therapy  Pt received therapy at [facility] (02/12/21-03/11/21) then Home health OT and PT for 8 weeks.      Precautions   Precautions Fall;Other (comment)    Precaution Comments left side hemiparesis, left side visual field deficits      Restrictions   Weight Bearing Restrictions No      Balance Screen   Has the patient fallen in the past 6 months No      Home  Environment   Family/patient expects to be discharged to: Private residence    Living Arrangements Other relatives   temporarily living with sister.   Available Help at Discharge [place] entrance    [place] One level    Bathroom  Shower/Tub Tub/Shower unit;Curtain    Bathroom Toilet Standard    How accessible Accessible via wheelchair;Accessible via walker   stops at door and uses walker to walk in   [place] Bedside commode;Walker - 2 wheels;Hospital bed    Additional Comments in loaner wheelchair with left side lap tray      Prior Function   Level of Independence Independent    Vocation On disability      ADL   Eating/Feeding Independent    Grooming Supervision/safety   trimming beard, brushing teeth, washing/dry face and hands.   Upper Body Bathing Minimal assistance   bed level with sister assisting   Lower Body Bathing Moderate assistance   bed level with sister assisting   Upper Body Dressing Moderate assistance   doffing is easier than donning. Sister assists.   Lower Body Dressing +1 Total aassistance   sister assists. Either bed level or seated on EOB.   Toilet Transfer Minimal assistance   with walker   Toileting - Clothing Manipulation + 1 Total assistance   sister assists   Toileting -  Hygiene + 1 Total assistance   sister assists   Tub/Shower Transfer --   has not attempted shower transfer due to lack of shower chair.   ADL comments Prior to recent CVA patient lived alone. Today, Patient requires mod assist for transfers from Wheelchair fdue to low surface. When transfering from a high level surface he requires contact guard assist and VC for technique; safety prior to sitting.      IADL   Prior Level of Function Meal Prep Independent    Meal Prep Needs to have meals prepared and served    Medication Management --   sister is assisting with medication management.     Mobility   Mobility Status Needs assist    Mobility Status Comments due to left side hemiparesis      Written Expression   Dominant Hand Right      Vision - History   Baseline Vision Wears glasses only for reading      Vision Assessment   Visual Fields Left visual field deficit    Comment --      Cognition   Overall  Cognitive Status Within Functional Limits for tasks assessed      Observation/Other Assessments   Observations Loaner wheelchair is too small for patient's size and height. Extremely poor sitting posture which causes increased pain.    Focus on Therapeutic Outcomes (FOTO)  Not completed during evaluation.      Sensation   Light Touch Impaired by gross assessment    Stereognosis Impaired by gross assessment    Hot/Cold Not tested   test at a later time   Proprioception Impaired by gross assessment  Coordination   Gross Motor Movements are Fluid and Coordinated No    Fine Motor Movements are Fluid and Coordinated No    9 Hole Peg Test --   Unable to test this date   Box and Blocks unable to test this date      Perception   Perception Impaired    Inattention/Neglect Impaired - to be further tested in functual context      Praxis   Praxis Not tested      Tone   Assessment Location Left Upper Extremity      ROM / Strength   AROM / PROM / Strength AROM;PROM;Strength      AROM   Overall AROM  Deficits    Overall AROM Comments Assessed supine initially then seated. IR/er adducted.    AROM Assessment Site Shoulder;Elbow;Forearm;Wrist;Finger;Thumb    Right/Left Shoulder Left    Left Shoulder Flexion --   No active movement supine or seated.   Left Shoulder ABduction --   no active movement supine or seated   Left Shoulder Internal Rotation --   no active movement supine or seated   Left Shoulder External Rotation --   no active movement supine or seated   Right/Left Elbow Left    Left Elbow Flexion --   full elbow flexion supine. Seated: 110   Left Elbow Extension --   full elbow extension supine. seated: -35   Right/Left Forearm Left    Left Forearm Pronation --   no active movement supine. Seated: allowed gravity to complete full range from neutral position   Left Forearm Supination --   slight muscle activation noted when supine. seated: able to supination to 0 degrees from  full pronation.   Right/Left Wrist Left    Left Wrist Extension --   supine: slight movement noted. seated: 10 in gravity eliminated plane   Left Wrist Flexion --   supine: no active movement. seated: 10 in gravity eliminated plane   Right/Left Finger Left    Left Composite Finger Extension 25%    Left Composite Finger Flexion 50%    Right/Left Thumb Left    Left Thumb Opposition Digit 2      PROM   Overall PROM  Deficits    Overall PROM Comments Assessed supine. Able to achieve shoulder flexion to 90 degrees before experiencing limitation. external rotation is tight and painful when slightly more than 0 degrees and shoulder adducted. full elbow flexion and extension. full wrist flexion. increased tone noted with wrist extension.      Strength   Strength Assessment Site Shoulder;Elbow;Forearm;Wrist;Hand    Right/Left Shoulder Left    Left Shoulder Flexion 1/5    Left Shoulder Extension 0/5    Left Shoulder ABduction 0/5    Left Shoulder Internal Rotation 1/5    Left Shoulder External Rotation 1/5    Right/Left Elbow Left    Left Elbow Flexion 3/5    Left Elbow Extension 3-/5    Right/Left Forearm Left    Left Forearm Pronation 3/5    Left Forearm Supination 3-/5    Right/Left Wrist Left    Left Wrist Flexion 3-/5    Left Wrist Extension 3-/5    Right/Left hand Left    Left Hand Gross Grasp Impaired   unable to test grip and pinch at this time.     LUE Tone   LUE Tone Modified Ashworth      LUE Tone   Modified Ashworth Scale for Grading Hypertonia LUE More  marked increase in muscle tone through most of the ROM, but affected part(s) easily moved                      OT Treatments/Exercises (OP) - 05/14/21 1323       Transfers   Transfers Sit to Stand;Stand to Sit    Sit to Stand 3: Mod assist;With upper extremity assist    Sit to Stand Details (indicate cue type and reason) from low surface such as wheelchair    Stand to Sit 4: Min guard;With upper extremity  assist    Comments from elevated high low mat table                   OT Education - 05/14/21 1516     Education Details Discussed evaluation findings and goals for therapy.    Person(s) Educated Patient    Methods Explanation    Comprehension Verbalized understanding              OT Short Term Goals - 05/14/21 1503       OT SHORT TERM GOAL #1   Title Patient will be educated and independent with HEP completion in order to faciliate his progress in therapy and begin to use his LUE as a gross assist for 25% or more of daily tasks.    Time 6    Period Weeks    Status New    Target Date 06/25/21      OT SHORT TERM GOAL #2   Title Patient will demonstrate a 25% increase in A/ROM of the LUE (shoulder, elbow, wrist) as able while using his LUE as a gross assist when completing self care tasks such as dressing.    Time 6    Period Weeks    Status New      OT SHORT TERM GOAL #3   Title Patient will demonstrate increased fine motor coordination in his left hand by completing the 9 hole peg test with peg set-up if needed.    Time 6    Period Weeks    Status New      OT SHORT TERM GOAL #4   Title Patient will increase is left hand strength to 10# and his pinch strength to 5# in order to begin to use his left hand to stabilize objects when completing self feeding.    Time 6    Period Weeks    Status New      OT SHORT TERM GOAL #5   Title Patient will completing bathing and dressing with Mod assist where needed while utilizing DME, Adaptive equipment, and/or compensatory techniques with improved sitting and standing balance.    Time 6    Period Weeks    Status New               OT Long Term Goals - 05/14/21 1507       OT LONG TERM GOAL #1   Title Patient will demonstrate improved LUE A/ROM by 50% or more while utilizing his LUE as his non dominant extremity to provide an active assist to complete tasks that require use of bilateral UE's.    Time 12     Period Weeks    Status New    Target Date 08/06/21      OT LONG TERM GOAL #2   Title Patient will increase his LUE strength (shoulder, elbow, wrist) to 4/5 or better while demonstrating improved ability to complete bathing and dressing  tasks.    Time 12    Period Weeks    Status New      OT LONG TERM GOAL #3   Title Patient will increase his fine motor coordination in his left hand by completing the 9 hole peg test in the standardized fashion under 1'.    Time 12    Period Weeks    Status New      OT LONG TERM GOAL #4   Title Patient will demonstrate improved left hand grip strength to 15# and pinch strength to 8# in order to manipulate and hold onto items in his left hand without dropping.    Time 12    Period Weeks    Status New      OT LONG TERM GOAL #5   Title Patient will completing bathing and dressing with min assist where needed while utilizing DME, Adaptive equipment, and/or compensatory techniques with improved sitting and standing balance.                   Plan - 05/14/21 1456     Clinical Impression Statement A: Patient is a 60 y/o male S/P right MCA with left side hemiparesis causing left side visual field deficits, LUE weakness, decreased A/ROM, increased tone, decreased coordination resulting in requiring physical assistance from family member(s) to complete ADL and IADL tasks and is unable to utilize his LUE as his nondominent extremity to complete any tasks. Currently in a loaner wheelchair although size is wrong for patient's height and weight. Was supposed to receive a wheelchair upon discharge from [facility] although it was not delivered.    OT Occupational Profile and History Problem Focused Assessment - Including review of records relating to presenting problem    Occupational performance deficits (Please refer to evaluation for details): ADL's;IADL's    Body Structure / Function / Physical Skills ADL;Decreased knowledge of use of  DME;Strength;Tone;GMC;Balance;UE functional use;Vision;ROM;IADL;Sensation;Coordination;FMC    Rehab Potential Excellent    Clinical Decision Making Several treatment options, min-mod task modification necessary    Comorbidities Affecting Occupational Performance: May have comorbidities impacting occupational performance    Modification or Assistance to Complete Evaluation  No modification of tasks or assist necessary to complete eval    OT Frequency 2x / week    OT Duration 12 weeks    OT Treatment/Interventions Self-care/ADL training;Moist Heat;DME and/or AE instruction;Splinting;Balance training;Therapeutic activities;Ultrasound;Therapeutic exercise;Visual/perceptual remediation/compensation;Passive range of motion;Neuromuscular education;Cryotherapy;Electrical Stimulation;Paraffin;Manual Therapy;Patient/family education    Plan P: Pt will benefit from skilled OT services to increase functional use of his LUE while increasing his ability to utilize it as his nondominant extremity during required ADL tasks. Treatment plan: NM re-education to LUE, weightbearing, P/ROM, AA/ROM, A/ROM, scapular, shoulder, elbow, wrist, and hand strengthening, coordination. Assess need for splinting. Modalities PRN. Provide ongoing education on safety awareness due to visual field deficit. Continue to assess sensory deficits and address as needed. Evaluating OT will contact case management from [facility] admission to ask about wheelchair. If needed, we can complete a custom wheelchair evaluation in clinic at a later date. NEXT SESSION:  Provide initial HEP. let patient know to stop any previous exercises provided for his UE and only complete the ones provided from [place].    Consulted and Agree with Plan of Care Patient             Patient will benefit from skilled therapeutic intervention in order to improve the following deficits and impairments:   Body Structure / Function / Physical Skills: ADL,  Decreased knowledge of use  of DME, Strength, Tone, GMC, Balance, UE functional use, Vision, ROM, IADL, Sensation, Coordination, [facility]       Visit Diagnosis: Other symptoms and signs involving the musculoskeletal system - Plan: Ot plan of care cert/re-cert  Other lack of coordination - Plan: Ot plan of care cert/re-cert  Other disturbances of skin sensation - Plan: Ot plan of care cert/re-cert  Stiffness of left hand, not elsewhere classified - Plan: Ot plan of care cert/re-cert  Stiffness of left elbow, not elsewhere classified - Plan: Ot plan of care cert/re-cert    Problem List Patient Active Problem List   Diagnosis Date Noted   Urinary frequency    Dysphagia, post-stroke    Spastic hemiplegia affecting nondominant side ([facility])    Drug-induced hypotension    Pressure injury of skin 02/14/2021   Right middle cerebral artery stroke ([place]) 02/12/2021   Dyslipidemia    Hemiparesis affecting left side as late effect of stroke ([place])    Middle cerebral artery embolism, right 02/03/2021   Status post surgery 02/02/2021   Stroke (cerebrum) ([place]) 02/02/2021   Hypoglycemia 01/25/2021   Thyroid nodule 01/25/2021   Hypothyroidism 01/25/2021   Near syncope 01/21/2021   H/O adenomatous polyp of colon 12/30/2020   Acute ischemic stroke ([place]) 11/28/2020   Essential hypertension 11/28/2020   Mixed hyperlipidemia 11/28/2020   Acute CVA (cerebrovascular accident) ([place]) 09/09/2020   Transient ischemic attack 09/07/2020   History of hypertension 09/07/2020   Elevated troponin 09/07/2020   Dizziness 09/07/2020   Numbness and tingling 09/07/2020   H/O medication noncompliance 09/07/2020   Medicare annual wellness visit, subsequent 12/05/2019   Nicotine dependence 12/05/2019   Chronic kidney disease, stage 3 unspecified ([place]) 10/24/2019   Prediabetes 10/24/2019   Edema of foot 09/14/2017   Tubular adenoma of colon 09/14/2017   Long term (current) use of anticoagulants 11/14/2014   Sickle cell trait ([place]) 08/29/2012    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 [place], [place], [address] Phone: [phone]   Fax:  [phone]  Name: Matthew Wise MRN: [id number] Date of Birth: 06-19-1961

## 2021-05-16 ENCOUNTER — Encounter (HOSPITAL_COMMUNITY): Payer: Self-pay | Admitting: Occupational Therapy

## 2021-05-16 ENCOUNTER — Other Ambulatory Visit: Payer: Self-pay

## 2021-05-16 ENCOUNTER — Ambulatory Visit (HOSPITAL_COMMUNITY): Payer: Medicare Other | Admitting: Occupational Therapy

## 2021-05-16 DIAGNOSIS — M25642 Stiffness of left hand, not elsewhere classified: Secondary | ICD-10-CM | POA: Diagnosis not present

## 2021-05-16 DIAGNOSIS — R208 Other disturbances of skin sensation: Secondary | ICD-10-CM | POA: Diagnosis not present

## 2021-05-16 DIAGNOSIS — R278 Other lack of coordination: Secondary | ICD-10-CM

## 2021-05-16 DIAGNOSIS — R262 Difficulty in walking, not elsewhere classified: Secondary | ICD-10-CM | POA: Diagnosis not present

## 2021-05-16 DIAGNOSIS — M6281 Muscle weakness (generalized): Secondary | ICD-10-CM | POA: Diagnosis not present

## 2021-05-16 DIAGNOSIS — R29898 Other symptoms and signs involving the musculoskeletal system: Secondary | ICD-10-CM

## 2021-05-16 DIAGNOSIS — M25622 Stiffness of left elbow, not elsewhere classified: Secondary | ICD-10-CM

## 2021-05-16 DIAGNOSIS — R2689 Other abnormalities of gait and mobility: Secondary | ICD-10-CM | POA: Diagnosis not present

## 2021-05-16 NOTE — Patient Instructions (Addendum)
Weight-bearing for Shoulder Subluxation   1) Weight-bearing lean stretch: From a seated position on your bed or bench, prop yourself up on your affected arm by placing your affected arm about a foot away from your body. Then lean into it.  Hold for 10 seconds.    OR   2) Rocking Side-to-Side: place both arms out beside you (one on either side). Then slowly rock from side to side, shifting your weight from one arm to the next. You can place a rolled up towel underneath your hand to increase comfort.   3) Weight-bearing in standing:  Gently lean your body weight into your hand or fist. Keep the elbow as straight as possible. Hold for 10 seconds.    Not yet  1) SHOULDER: Flexion On Table   Place hands on towel placed on table, elbows straight. Lean forward with you upper body, pushing towel away from body.  ___ reps per set, ___ sets per day  2) Abduction (Passive)   With arm out to side, resting on towel placed on table with palm DOWN, keeping trunk away from table, lean to the side while pushing towel away from body.  Repeat __10__ times. Do __2 to 3__ sessions per day.  Copyright  VHI. All rights reserved.     3) Internal Rotation (Assistive)   Seated with elbow bent at right angle and held against side, slide arm on table surface in an inward arc keeping elbow anchored in place. Repeat __10__ times. Do __2 to 3__ sessions per day. Activity: Use this motion to brush crumbs off the table.  Copyright  VHI. All rights reserved.

## 2021-05-16 NOTE — Therapy (Signed)
[place], [place], [address] Phone: [phone]   Fax:  [phone]  Occupational Therapy Treatment  Patient Details  Name: Matthew Wise MRN: [id number] Date of Birth: 1960/12/12 Referring Provider (OT): Alysia Penna, MD   Encounter Date: 05/16/2021   OT End of Session - 05/16/21 1410     Visit Number 2    Number of Visits 24    Date for OT Re-Evaluation 08/06/21   mini reassess:06/11/21   Authorization Type UHC medicare    Authorization Time Period 11/30/20-end of year. $30 copay. 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 two simple tubular adenomas removed. patient was advised to go to [facility] for colonoscopy but he did not keep appt.   IR CT HEAD LTD  02/02/2021   IR PERCUTANEOUS ART THROMBECTOMY/INFUSION INTRACRANIAL INC DIAG  ANGIO  02/02/2021   RADIOLOGY WITH ANESTHESIA N/A 02/02/2021   Procedure: IR WITH ANESTHESIA;  Surgeon: Luanne Bras, MD;  Location: [facility];  Service: Radiology;  Laterality: N/A;   TEE WITHOUT CARDIOVERSION N/A 09/10/2020   Procedure: TRANSESOPHAGEAL ECHOCARDIOGRAM (TEE) WITH PROPOFOL;  Surgeon: Satira Sark, MD;  Location: [facility];  Service: Cardiovascular;  Laterality: N/A;    There were no vitals filed for this visit.   Subjective Assessment - 05/16/21 1306     Currently in Pain? No/denies                [facility] OT Assessment - 05/16/21 0001       Assessment   Medical Diagnosis S/P right MCA with left hemiparesis    Referring Provider (OT) Alysia Penna, MD      Precautions   Precautions Fall;Other (comment)    Precaution Comments left side hemiparesis, left side visual field deficits                      OT Treatments/Exercises (OP) - 05/16/21 0001       Bed Mobility   Bed Mobility Sit to Supine;Supine to Sit    Supine to Sit Moderate Assistance - Patient 50-74%    Sit to Supine Moderate Assistance - Patient 50-74%      Transfers   Transfers Sit to Stand;Stand to Sit  Sit to Stand 3: Mod assist;With upper extremity assist    Sit to Stand Details (indicate cue type and reason) from w/c and mat table      Exercises   Exercises Shoulder;Elbow;Wrist      Shoulder Exercises: Supine   Horizontal ABduction PROM;5 reps    External Rotation PROM;5 reps    Internal Rotation PROM;5 reps    Flexion PROM;5 reps    ABduction PROM;5 reps      Neurological Re-education Exercises   Other Weight-Bearing Exercises 1 L UE weight bearing with support to block elbow when leaning to L side to place and remove squigz from mirror. Extended time taken for this exercise with pt frequently resting between attempts.                    OT Education - 05/16/21 1409     Education Details Provided HEP for L UE weight bearing and table slides.     Person(s) Educated Patient;Caregiver(s)    Methods Explanation;Demonstration;Handout;Tactile cues    Comprehension Verbalized understanding              OT Short Term Goals - 05/16/21 1415       OT SHORT TERM GOAL #1   Title Patient will be educated and independent with HEP completion in order to faciliate his progress in therapy and begin to use his LUE as a gross assist for 25% or more of daily tasks.    Time 6    Period Weeks    Status On-going    Target Date 06/25/21      OT SHORT TERM GOAL #2   Title Patient will demonstrate a 25% increase in A/ROM of the LUE (shoulder, elbow, wrist) as able while using his LUE as a gross assist when completing self care tasks such as dressing.    Time 6    Period Weeks    Status On-going      OT SHORT TERM GOAL #3   Title Patient will demonstrate increased fine motor coordination in his left hand by completing the 9 hole peg test with peg set-up if needed.    Time 6    Period Weeks    Status On-going      OT SHORT TERM GOAL #4   Title Patient will increase is left hand strength to 10# and his pinch strength to 5# in order to begin to use his left hand to stabilize objects when completing self feeding.    Time 6    Period Weeks    Status On-going      OT SHORT TERM GOAL #5   Title Patient will completing bathing and dressing with Mod assist where needed while utilizing DME, Adaptive equipment, and/or compensatory techniques with improved sitting and standing balance.    Time 6    Period Weeks    Status On-going               OT Long Term Goals - 05/16/21 1415       OT LONG TERM GOAL #1   Title Patient will demonstrate improved LUE A/ROM by 50% or more while utilizing his LUE as his non dominant extremity to provide an active assist to complete tasks that require use of bilateral UE's.    Time 12    Period Weeks    Status On-going      OT LONG TERM GOAL #2   Title Patient will increase his LUE strength (shoulder,  elbow, wrist)  to 4/5 or better while demonstrating improved ability to complete bathing and dressing tasks.    Time 12    Period Weeks    Status On-going      OT LONG TERM GOAL #3   Title Patient will increase his fine motor coordination in his left hand by completing the 9 hole peg test in the standardized fashion under 1'.    Time 12    Period Weeks    Status On-going      OT LONG TERM GOAL #4   Title Patient will demonstrate improved left hand grip strength to 15# and pinch strength to 8# in order to manipulate and hold onto items in his left hand without dropping.    Time 12    Period Weeks    Status On-going      OT LONG TERM GOAL #5   Title Patient will completing bathing and dressing with min assist where needed while utilizing DME, Adaptive equipment, and/or compensatory techniques with improved sitting and standing balance.    Status On-going                   Plan - 05/16/21 1410     Clinical Impression Statement A: Pt required moderate assist for sit to stand and stand pivot transfer from w/c to mat table and back. Mod A required for sit to supine and supine to sit bed mobility. Pt able to complete lateral leaning and L UE weight bearing to place squigz on a mirror L of midline. Pt required blocking of L elbow and assist to maintain wrist extension and open hand on table while reaching to place squigz with R UE. Pt tolerated P/ROM and attempt to try A/AROM supine for shoulder flexion but pt is not appropriate yet for this exercises due to lack of A/ROM in shoulder.    Body Structure / Function / Physical Skills ADL;Decreased knowledge of use of DME;Strength;Tone;GMC;Balance;UE functional use;Vision;ROM;IADL;Sensation;Coordination;FMC    OT Treatment/Interventions Self-care/ADL training;Moist Heat;DME and/or AE instruction;Splinting;Balance training;Therapeutic activities;Ultrasound;Therapeutic exercise;Visual/perceptual remediation/compensation;Passive range of  motion;Neuromuscular education;Cryotherapy;Electrical Stimulation;Paraffin;Manual Therapy;Patient/family education    Plan P: Follow up with pt on weight bearing and table slide exercises. Continue weight bearing and P/ROM. Possibly try ball rolls. Monitor for subluxation.    OT Home Exercise Plan 6/17 L UE weight bearing, table slides    Consulted and Agree with Plan of Care Patient             Patient will benefit from skilled therapeutic intervention in order to improve the following deficits and impairments:   Body Structure / Function / Physical Skills: ADL, Decreased knowledge of use of DME, Strength, Tone, GMC, Balance, UE functional use, Vision, ROM, IADL, Sensation, Coordination, [facility]       Visit Diagnosis: Other symptoms and signs involving the musculoskeletal system  Other lack of coordination  Stiffness of left hand, not elsewhere classified  Stiffness of left elbow, not elsewhere classified    Problem List Patient Active Problem List   Diagnosis Date Noted   Urinary frequency    Dysphagia, post-stroke    Spastic hemiplegia affecting nondominant side (HCC)    Drug-induced hypotension    Pressure injury of skin 02/14/2021   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   Dyslipidemia    Hemiparesis affecting left side as late effect of stroke ([place])    Middle cerebral artery embolism, right 02/03/2021   Status post surgery 02/02/2021   Stroke (cerebrum) ([place]) 02/02/2021   Hypoglycemia 01/25/2021  Thyroid nodule 01/25/2021   Hypothyroidism 01/25/2021   Near syncope 01/21/2021   H/O adenomatous polyp of colon 12/30/2020   Acute ischemic stroke ([place]) 11/28/2020   Essential hypertension 11/28/2020   Mixed hyperlipidemia 11/28/2020   Acute CVA (cerebrovascular accident) ([place]) 09/09/2020   Transient ischemic attack 09/07/2020   History of hypertension 09/07/2020   Elevated troponin 09/07/2020   Dizziness 09/07/2020   Numbness and tingling 09/07/2020    H/O medication noncompliance 09/07/2020   Medicare annual wellness visit, subsequent 12/05/2019   Nicotine dependence 12/05/2019   Chronic kidney disease, stage 3 unspecified ([place]) 10/24/2019   Prediabetes 10/24/2019   Edema of foot 09/14/2017   Tubular adenoma of colon 09/14/2017   Long term (current) use of anticoagulants 11/14/2014   Sickle cell trait ([place]) 08/29/2012   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

## 2021-05-20 ENCOUNTER — Ambulatory Visit (HOSPITAL_COMMUNITY): Payer: Medicare Other | Admitting: Occupational Therapy

## 2021-05-20 ENCOUNTER — Other Ambulatory Visit: Payer: Self-pay

## 2021-05-20 NOTE — Patient Outreach (Signed)
[facility]) Care Management  05/20/2021  FARHAN JEAN 03-04-1961 [id number]   Telephone outreach to patient to obtain mRS was successfully completed. MRS= 5  Thank you, [place] Care Management Assistant

## 2021-05-22 ENCOUNTER — Encounter (HOSPITAL_COMMUNITY): Payer: Self-pay | Admitting: Physical Therapy

## 2021-05-22 ENCOUNTER — Ambulatory Visit (HOSPITAL_COMMUNITY): Payer: Medicare Other | Admitting: Physical Therapy

## 2021-05-22 ENCOUNTER — Other Ambulatory Visit: Payer: Self-pay

## 2021-05-22 ENCOUNTER — Ambulatory Visit (HOSPITAL_COMMUNITY): Payer: Medicare Other | Admitting: Specialist

## 2021-05-22 DIAGNOSIS — M25642 Stiffness of left hand, not elsewhere classified: Secondary | ICD-10-CM | POA: Diagnosis not present

## 2021-05-22 DIAGNOSIS — R29898 Other symptoms and signs involving the musculoskeletal system: Secondary | ICD-10-CM | POA: Diagnosis not present

## 2021-05-22 DIAGNOSIS — M25622 Stiffness of left elbow, not elsewhere classified: Secondary | ICD-10-CM | POA: Diagnosis not present

## 2021-05-22 DIAGNOSIS — R2689 Other abnormalities of gait and mobility: Secondary | ICD-10-CM

## 2021-05-22 DIAGNOSIS — R262 Difficulty in walking, not elsewhere classified: Secondary | ICD-10-CM | POA: Diagnosis not present

## 2021-05-22 DIAGNOSIS — M6281 Muscle weakness (generalized): Secondary | ICD-10-CM

## 2021-05-22 DIAGNOSIS — R208 Other disturbances of skin sensation: Secondary | ICD-10-CM | POA: Diagnosis not present

## 2021-05-22 DIAGNOSIS — R278 Other lack of coordination: Secondary | ICD-10-CM | POA: Diagnosis not present

## 2021-05-22 NOTE — Patient Instructions (Signed)
Patient is a 60 y.o. male who presents to physical therapy with complaint of LT sided weakness s/p CVA. Patient demonstrates decreased strength, balance deficits and gait abnormalities which are negatively impacting patient ability to perform ADLs and functional mobility tasks. Patient will benefit from skilled physical therapy services to address these deficits to improve level of function with ADLs, functional mobility tasks, and reduce risk for falls.

## 2021-05-22 NOTE — Therapy (Signed)
[place], [place], [address] Phone: [phone]   Fax:  [phone]  Physical Therapy Evaluation  Patient Details  Name: Matthew Wise MRN: [id number] Date of Birth: Sep 17, 1961 Referring Provider (PT): Alysia Penna MD   Encounter Date: 05/22/2021   PT End of Session - 05/22/21 1627     Visit Number 1    Number of Visits 16    Date for PT Re-Evaluation 07/17/21    Authorization Type U[employer] MEdicare    Progress Note Due on Visit 10    PT Start Time 1545    PT Stop Time 1630    PT Time Calculation (min) 45 min    Equipment Utilized During Treatment Gait belt    Activity Tolerance Patient tolerated treatment well    Behavior During Therapy [facility] for tasks assessed/performed             Past Medical History:  Diagnosis Date   Acute CVA (cerebrovascular accident) ([place]) 09/09/2020   Chronic anticoagulation 11/14/2014   CKD (chronic kidney disease) stage 3, GFR 30-59 ml/min ([place]) 10/24/2019   CKD (chronic kidney disease) stage 3, GFR 30-59 ml/min ([place]) 10/24/2019   Clotting disorder ([place])    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 two simple tubular adenomas removed. patient was advised to go to [facility] for colonoscopy but he did not keep appt.   IR CT HEAD LTD  02/02/2021   IR PERCUTANEOUS ART THROMBECTOMY/INFUSION INTRACRANIAL INC DIAG ANGIO  02/02/2021   RADIOLOGY WITH ANESTHESIA N/A  02/02/2021   Procedure: IR WITH ANESTHESIA;  Surgeon: Luanne Bras, MD;  Location: [facility];  Service: Radiology;  Laterality: N/A;   TEE WITHOUT CARDIOVERSION N/A 09/10/2020   Procedure: TRANSESOPHAGEAL ECHOCARDIOGRAM (TEE) WITH PROPOFOL;  Surgeon: Satira Sark, MD;  Location[facility];  Service: Cardiovascular;  Laterality: N/A;    There were no vitals filed for this visit.    Subjective Assessment - 05/22/21 1554     Subjective Patient presents to therapy with complaint of LT side weakness s/p CVA 02/02/21. He has had decreased mobility and difficulty with transfers since this time., He had some inpatient therapy at [facility]. He feels this was helpful. He feels he has improved some overall but still having difficulty with ADLs. His sister is currently acting as his caretaker and assists him with ADLs.    Pertinent History CVA 02/02/21    Limitations Walking;Standing;House hold activities    Patient Stated Goals Be able to get up and go without walker    Currently in Pain? No/denies                [facility] PT Assessment - 05/22/21 0001       Assessment   Medical Diagnosis S/P right MCA with left hemiparesis    Referring Provider (PT) Alysia Penna MD    Onset Date/Surgical Date 02/02/21    Prior Therapy Yes  Precautions   Precautions Fall      Restrictions   Weight Bearing Restrictions No      Balance Screen   Has the patient fallen in the past 6 months No      [place] residence    Living Arrangements Other relatives    Available Help at Discharge Family      Prior Function   Level of Independence Independent    Vocation On disability      Cognition   Overall Cognitive Status Within Functional Limits for tasks assessed      Sensation   Light Touch Impaired by gross assessment   Decreased sensation     ROM / Strength   AROM / PROM / Strength Strength      Strength   Strength Assessment Site Hip;Knee;Ankle     Right/Left Hip Right;Left    Right Hip Flexion 3+/5    Left Hip Flexion 2+/5    Right/Left Knee Right;Left    Right Knee Extension 4-/5    Left Knee Extension 2+/5    Right/Left Ankle Right;Left    Right Ankle Dorsiflexion 4-/5    Left Ankle Dorsiflexion 1/5      Bed Mobility   Bed Mobility Sit to Supine;Supine to Sit    Supine to Sit Moderate Assistance - Patient 50-74%    Sit to Supine Moderate Assistance - Patient 50-74%      Transfers   Sit to Stand 3: Mod assist;With upper extremity assist      Ambulation/Gait   Ambulation/Gait Yes    Ambulation/Gait Assistance 3: Mod assist    Ambulation Distance (Feet) 8 Feet    Assistive device Rolling walker    Gait Pattern Decreased step length - right;Decreased stance time - left;Decreased stride length;Decreased hip/knee flexion - left;Decreased dorsiflexion - left;Decreased dorsiflexion - right;Decreased weight shift to left;Ataxic;Poor foot clearance - left    Ambulation Surface Level;Indoor                        Objective measurements completed on examination: See above findings.               PT Education - 05/22/21 1555     Education Details on evaluation findings, POC and HEP    Person(s) Educated Patient    Methods Explanation;Handout    Comprehension Verbalized understanding              PT Short Term Goals - 05/22/21 1641       PT SHORT TERM GOAL #1   Title Patient will be independent with initial HEP and self-management strategies to improve functional outcomes    Time 4    Period Weeks    Status New    Target Date 06/19/21      PT SHORT TERM GOAL #2   Title Patient will be Min A with transfers and bed mobility    Time 4    Period Weeks    Status New    Target Date 06/19/21               PT Long Term Goals - 05/22/21 1642       PT LONG TERM GOAL #1   Title Patient will be Mod I with bed mobility and transfers    Time 8    Period Weeks    Status New    Target  Date 07/17/21      PT LONG  TERM GOAL #2   Title Patient will be able to ambulate >100 feet with LRAD for improved household mobility    Time 8    Period Weeks    Status New    Target Date 07/17/21      PT LONG TERM GOAL #3   Title Patient will have at least 4/5 MMT throughout LLE (except ankle DF) for improved functional mobility and stair/ step navigation.    Time 8    Period Weeks    Status New    Target Date 07/17/21                    Plan - 05/22/21 1628     Clinical Impression Statement Patient is a 60 y.o. male who presents to physical therapy with complaint of LT sided weakness s/p CVA. Patient demonstrates decreased strength, balance deficits and gait abnormalities which are negatively impacting patient ability to perform ADLs and functional mobility tasks. Patient will benefit from skilled physical therapy services to address these deficits to improve level of function with ADLs, functional mobility tasks, and reduce risk for falls.    Examination-Activity Limitations Caring for Others;Transfers;Locomotion Level;Toileting;Stand    Examination-Participation Restrictions Yard Work;Cleaning;Laundry;Community Activity    Stability/Clinical Decision Making Evolving/Moderate complexity    Clinical Decision Making Moderate    Rehab Potential Fair    PT Frequency 2x / week    PT Duration 8 weeks    PT Treatment/Interventions ADLs/Self Care Home Management;Aquatic Therapy;Biofeedback;Cryotherapy;Fluidtherapy;Contrast Bath;Electrical Stimulation;Therapeutic exercise;Orthotic Fit/Training;Patient/family education;Manual lymph drainage;Manual techniques;Dry needling;Energy conservation;Spinal Manipulations;Joint Manipulations;Splinting;Taping;Compression bandaging;Vasopneumatic Device;Therapeutic activities;Parrafin;Ultrasound;Traction;Functional mobility training;Neuromuscular re-education;Stair training;Gait training;Moist Heat;Iontophoresis 4mg /ml Dexamethasone;DME  Instruction;Balance training;Passive range of motion;Scar mobilization;Wheelchair mobility training;Visual/perceptual remediation/compensation    PT Next Visit Plan Review goals and HEP. Progress functional mobility as tolerated. Seated and mat strengthening, encourage WB through LT side. Standing balance> gait    PT Home Exercise Plan Eval: marches, LAQs, heel/toe raise    Consulted and Agree with Plan of Care Patient             Patient will benefit from skilled therapeutic intervention in order to improve the following deficits and impairments:  Abnormal gait, Improper body mechanics, Impaired sensation, Decreased balance, Difficulty walking, Decreased activity tolerance, Decreased range of motion, Decreased strength, Hypomobility, Impaired tone, Decreased mobility, Decreased coordination  Visit Diagnosis: Muscle weakness (generalized)  Difficulty in walking, not elsewhere classified  Other abnormalities of gait and mobility     Problem List Patient Active Problem List   Diagnosis Date Noted   Urinary frequency    Dysphagia, post-stroke    Spastic hemiplegia affecting nondominant side (HCC)    Drug-induced hypotension    Pressure injury of skin 02/14/2021   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   Dyslipidemia    Hemiparesis affecting left side as late effect of stroke ([place])    Middle cerebral artery embolism, right 02/03/2021   Status post surgery 02/02/2021   Stroke (cerebrum) ([place]) 02/02/2021   Hypoglycemia 01/25/2021   Thyroid nodule 01/25/2021   Hypothyroidism 01/25/2021   Near syncope 01/21/2021   H/O adenomatous polyp of colon 12/30/2020   Acute ischemic stroke ([place]) 11/28/2020   Essential hypertension 11/28/2020   Mixed hyperlipidemia 11/28/2020   Acute CVA (cerebrovascular accident) ([place]) 09/09/2020   Transient ischemic attack 09/07/2020   History of hypertension 09/07/2020   Elevated troponin 09/07/2020   Dizziness 09/07/2020   Numbness and  tingling 09/07/2020   H/O medication noncompliance 09/07/2020   Medicare annual wellness visit, subsequent  12/05/2019   Nicotine dependence 12/05/2019   Chronic kidney disease, stage 3 unspecified ([place]) 10/24/2019   Prediabetes 10/24/2019   Edema of foot 09/14/2017   Tubular adenoma of colon 09/14/2017   Long term (current) use of anticoagulants 11/14/2014   Sickle cell trait ([place]) 08/29/2012   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

## 2021-05-26 ENCOUNTER — Ambulatory Visit (HOSPITAL_COMMUNITY): Payer: Medicare Other

## 2021-05-26 ENCOUNTER — Other Ambulatory Visit: Payer: Self-pay

## 2021-05-26 ENCOUNTER — Encounter (HOSPITAL_COMMUNITY): Payer: Self-pay

## 2021-05-26 DIAGNOSIS — M25622 Stiffness of left elbow, not elsewhere classified: Secondary | ICD-10-CM

## 2021-05-26 DIAGNOSIS — R2689 Other abnormalities of gait and mobility: Secondary | ICD-10-CM | POA: Diagnosis not present

## 2021-05-26 DIAGNOSIS — M25642 Stiffness of left hand, not elsewhere classified: Secondary | ICD-10-CM

## 2021-05-26 DIAGNOSIS — R208 Other disturbances of skin sensation: Secondary | ICD-10-CM | POA: Diagnosis not present

## 2021-05-26 DIAGNOSIS — R278 Other lack of coordination: Secondary | ICD-10-CM

## 2021-05-26 DIAGNOSIS — R29898 Other symptoms and signs involving the musculoskeletal system: Secondary | ICD-10-CM

## 2021-05-26 DIAGNOSIS — R262 Difficulty in walking, not elsewhere classified: Secondary | ICD-10-CM | POA: Diagnosis not present

## 2021-05-26 DIAGNOSIS — M6281 Muscle weakness (generalized): Secondary | ICD-10-CM | POA: Diagnosis not present

## 2021-05-26 NOTE — Therapy (Signed)
[place], [place], [address] Phone: [phone]   Fax:  [phone]  Occupational Therapy Treatment  Patient Details  Name: Matthew Wise MRN: [id number] Date of Birth: 1961/08/02 Referring Provider (OT): Alysia Penna, MD   Encounter Date: 05/26/2021   OT End of Session - 05/26/21 1507     Visit Number 3    Number of Visits 24    Date for OT Re-Evaluation 08/06/21   mini reassess:06/11/21   Authorization Type [employer] medicare    Authorization Time Period 11/30/20-end of year. $30 copay. 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 two simple tubular adenomas removed. patient was advised to go to [facility] for colonoscopy but he did not keep appt.   IR CT HEAD LTD  02/02/2021   IR PERCUTANEOUS ART THROMBECTOMY/INFUSION INTRACRANIAL INC DIAG  ANGIO  02/02/2021   RADIOLOGY WITH ANESTHESIA N/A 02/02/2021   Procedure: IR WITH ANESTHESIA;  Surgeon: Luanne Bras, MD;  Location: [facility];  Service: Radiology;  Laterality: N/A;   TEE WITHOUT CARDIOVERSION N/A 09/10/2020   Procedure: TRANSESOPHAGEAL ECHOCARDIOGRAM (TEE) WITH PROPOFOL;  Surgeon: Satira Sark, MD;  Location: [facility];  Service: Cardiovascular;  Laterality: N/A;    There were no vitals filed for this visit.   Subjective Assessment - 05/26/21 1351     Subjective  S: i only have pain if I lift my arm up to a certain point.    Patient is accompanied by: Family member   Nephew   Currently in Pain? No/denies                [facility] OT Assessment - 05/26/21 1352       Assessment   Medical Diagnosis S/P right MCA with left hemiparesis      Precautions   Precautions Fall                      OT Treatments/Exercises (OP) - 05/26/21 1352       Transfers   Transfers Sit to Stand;Stand to Sit    Sit to Stand 4: Min assist;Multiple attempts;With upper extremity assist;Other (comment)    Sit to Stand Details (indicate cue type and reason) Initially unsuccessful. With VC for technique  patient was able to complete sit to stand from wheelchair with Min assist and RW.    Stand to Sit 4: Min guard;With upper extremity assist;To elevated surface    Comments VC provided for safety and technique.      Exercises   Exercises Shoulder;Elbow      Shoulder Exercises: Isometric Strengthening   Flexion 3X5"   seated   Extension 3X5"   seated   ABduction 3X5"   seated   ADduction 3X5"   seated   Other Isometric Exercises Elbow flexion 3x5", elbow extension 3x5" seated for both      Neurological Re-education Exercises   Other Exercises 1 Scapular retraction seated; 5X, AA/ROM      Manual Therapy   Manual Therapy Taping    Manual therapy comments Manual therapy completed prior to exercises.    Kinesiotex Inhibit Muscle   Joint stability                    OT Education - 05/26/21 1350     Education Details Discussed subluxation and why it typically occurs after sustaining a stroke. Kinsiotape education completed and reviewed. Discussed length of wear, precautions, bathing instructions, and removal technique.    Person(s) Educated Associate Professor)   nephew   Methods Explanation;Handout    Comprehension Verbalized understanding              OT Short Term Goals - 05/16/21 1415       OT SHORT TERM GOAL #1   Title Patient will be educated and independent with HEP completion in order to faciliate his progress in therapy and begin to use his LUE as a gross assist for 25% or more of daily tasks.    Time 6    Period Weeks    Status On-going    Target Date 06/25/21      OT SHORT TERM GOAL #2   Title Patient will demonstrate a 25% increase in A/ROM of the LUE (shoulder, elbow, wrist) as able while using his LUE as a gross assist when completing self care tasks such as dressing.    Time 6    Period Weeks    Status On-going      OT SHORT TERM GOAL #3   Title Patient will demonstrate increased fine motor coordination in his left hand by completing the 9 hole peg test with peg set-up if needed.    Time 6    Period Weeks    Status On-going      OT SHORT TERM GOAL #4   Title Patient will increase is left hand strength to 10# and his pinch strength to 5# in order to begin to use his left hand to stabilize objects when completing self feeding.    Time 6    Period Weeks    Status On-going      OT SHORT TERM GOAL #5   Title Patient will completing bathing and dressing with Mod assist where needed while utilizing DME, Adaptive equipment, and/or compensatory techniques with improved sitting and standing balance.    Time 6    Period Weeks    Status On-going               OT Long Term Goals - 05/16/21 1415       OT LONG TERM GOAL #1   Title Patient will demonstrate improved LUE A/ROM by 50% or more while  utilizing his LUE as his non dominant extremity to provide an active assist to  complete tasks that require use of bilateral UE's.    Time 12    Period Weeks    Status On-going      OT LONG TERM GOAL #2   Title Patient will increase his LUE strength (shoulder, elbow, wrist) to 4/5 or better while demonstrating improved ability to complete bathing and dressing tasks.    Time 12    Period Weeks    Status On-going      OT LONG TERM GOAL #3   Title Patient will increase his fine motor coordination in his left hand by completing the 9 hole peg test in the standardized fashion under 1'.    Time 12    Period Weeks    Status On-going      OT LONG TERM GOAL #4   Title Patient will demonstrate improved left hand grip strength to 15# and pinch strength to 8# in order to manipulate and hold onto items in his left hand without dropping.    Time 12    Period Weeks    Status On-going      OT LONG TERM GOAL #5   Title Patient will completing bathing and dressing with min assist where needed while utilizing DME, Adaptive equipment, and/or compensatory techniques with improved sitting and standing balance.    Status On-going                   Plan - 05/26/21 1545     Clinical Impression Statement A: Pt presents with 1 finger subluxation at the left shoulder AC joint. Discussed utilizing kinsiotape to help stabilize and position shoulder joint in order to help with pain and functional mobility. While seated completed isometric shoulder and elbow excercises. Patient demonstrates increased muscle fatigue with each repetition. Continues to lack any active scapular elevation. Minimal scapular retraction noted. Provided hand over hand assist for hand placement on walker when transferring from chair to mat table and back with VC for form and technique.    Body Structure / Function / Physical Skills ADL;Decreased knowledge of use of DME;Strength;Tone;GMC;Balance;UE functional  use;Vision;ROM;IADL;Sensation;Coordination;FMC    Plan P: Follow up on kinsiotape benefits. Complete standing weightbearing into left hand or forearm while reaching with right and crossing midline. May even benefit from use of parallel bars.    Consulted and Agree with Plan of Care Patient             Patient will benefit from skilled therapeutic intervention in order to improve the following deficits and impairments:   Body Structure / Function / Physical Skills: ADL, Decreased knowledge of use of DME, Strength, Tone, GMC, Balance, UE functional use, Vision, ROM, IADL, Sensation, Coordination, [facility]       Visit Diagnosis: Other symptoms and signs involving the musculoskeletal system  Other lack of coordination  Stiffness of left hand, not elsewhere classified  Stiffness of left elbow, not elsewhere classified    Problem List Patient Active Problem List   Diagnosis Date Noted   Urinary frequency    Dysphagia, post-stroke    Spastic hemiplegia affecting nondominant side (HCC)    Drug-induced hypotension    Pressure injury of skin 02/14/2021   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   Dyslipidemia    Hemiparesis affecting left side as late effect of stroke ([place])    Middle cerebral artery embolism, right 02/03/2021   Status post surgery 02/02/2021   Stroke (cerebrum) ([place]) 02/02/2021   Hypoglycemia 01/25/2021   Thyroid nodule 01/25/2021   Hypothyroidism 01/25/2021  Near syncope 01/21/2021   H/O adenomatous polyp of colon 12/30/2020   Acute ischemic stroke ([place]) 11/28/2020   Essential hypertension 11/28/2020   Mixed hyperlipidemia 11/28/2020   Acute CVA (cerebrovascular accident) ([place]) 09/09/2020   Transient ischemic attack 09/07/2020   History of hypertension 09/07/2020   Elevated troponin 09/07/2020   Dizziness 09/07/2020   Numbness and tingling 09/07/2020   H/O medication noncompliance 09/07/2020   Medicare annual wellness visit, subsequent  12/05/2019   Nicotine dependence 12/05/2019   Chronic kidney disease, stage 3 unspecified ([place]) 10/24/2019   Prediabetes 10/24/2019   Edema of foot 09/14/2017   Tubular adenoma of colon 09/14/2017   Long term (current) use of anticoagulants 11/14/2014   Sickle cell trait ([place]) 08/29/2012   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

## 2021-05-28 ENCOUNTER — Other Ambulatory Visit: Payer: Self-pay

## 2021-05-28 ENCOUNTER — Encounter (HOSPITAL_COMMUNITY): Payer: Self-pay

## 2021-05-28 ENCOUNTER — Ambulatory Visit (HOSPITAL_COMMUNITY): Payer: Medicare Other

## 2021-05-28 DIAGNOSIS — R262 Difficulty in walking, not elsewhere classified: Secondary | ICD-10-CM | POA: Diagnosis not present

## 2021-05-28 DIAGNOSIS — M25622 Stiffness of left elbow, not elsewhere classified: Secondary | ICD-10-CM

## 2021-05-28 DIAGNOSIS — M6281 Muscle weakness (generalized): Secondary | ICD-10-CM | POA: Diagnosis not present

## 2021-05-28 DIAGNOSIS — M25642 Stiffness of left hand, not elsewhere classified: Secondary | ICD-10-CM

## 2021-05-28 DIAGNOSIS — R29898 Other symptoms and signs involving the musculoskeletal system: Secondary | ICD-10-CM

## 2021-05-28 DIAGNOSIS — R278 Other lack of coordination: Secondary | ICD-10-CM

## 2021-05-28 DIAGNOSIS — R208 Other disturbances of skin sensation: Secondary | ICD-10-CM | POA: Diagnosis not present

## 2021-05-28 DIAGNOSIS — R2689 Other abnormalities of gait and mobility: Secondary | ICD-10-CM | POA: Diagnosis not present

## 2021-05-29 NOTE — Therapy (Signed)
[place], [place], [address] Phone: [phone]   Fax:  [phone]  Occupational Therapy Treatment  Patient Details  Name: DEVLON DOSHER MRN: [id number] Date of Birth: [birth year] Referring Provider (OT): Alysia Penna, MD   Encounter Date: 05/28/2021   OT End of Session - 05/28/21 4193     Visit Number 4    Number of Visits 24    Date for OT Re-Evaluation 08/06/21   mini reassess:06/11/21   Authorization Type [employer] medicare    Authorization Time Period 11/30/20-end of year. $30 copay. 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 two simple tubular adenomas removed. patient was advised to go to [facility] for colonoscopy but he did not keep appt.   IR CT HEAD LTD  02/02/2021   IR PERCUTANEOUS ART THROMBECTOMY/INFUSION INTRACRANIAL INC DIAG  ANGIO  02/02/2021   RADIOLOGY WITH ANESTHESIA N/A 02/02/2021   Procedure: IR WITH ANESTHESIA;  Surgeon: Luanne Bras, MD;  Location: [facility];  Service: Radiology;  Laterality: N/A;   TEE WITHOUT CARDIOVERSION N/A 09/10/2020   Procedure: TRANSESOPHAGEAL ECHOCARDIOGRAM (TEE) WITH PROPOFOL;  Surgeon: Satira Sark, MD;  Location: [facility];  Service: Cardiovascular;  Laterality: N/A;    There were no vitals filed for this visit.   Subjective Assessment - 05/28/21 1556     Subjective  S: I can't really tell a difference. (When asked if he notices any difference with the kinsiotape to his left shoulder.    Patient is accompanied by: Family member   Nephew   Currently in Pain? No/denies                [facility] OT Assessment - 05/28/21 1557       Assessment   Medical Diagnosis S/P right MCA with left hemiparesis      Precautions   Precautions Fall    Precaution Comments left side hemiparesis, left side visual field deficits                      OT Treatments/Exercises (OP) - 05/28/21 1557       Exercises   Exercises Shoulder      Neurological Re-education Exercises   Other Exercises 1 Standing at parallel bars:  Holding onto parallel bars with left hand, pt reached to the right side of body to retrieve a card with his right UE followed by weightshifting over to the left to give to therapist. verbal and tactile cues for engagement left hand to hold onto parallel bars. Completed same weightshifting task using resistive clothespins. Retrieved with right hand from right side before weightshifting to the left to place on vertical pole.    Other Exercises 2 Standing in parellel bars, left hand was placed on washcloth, provided visual targets while completing AA/ROM shoulder flexion standing 10X    Weight Shifting Lateral;Outside Base of Support;Anterior/Posterior    Weight-Shifting Exercises - Anterior/Posterior Standing inside parallel facing one side and holding onto bar  with both hand, patient weightshifted forward and backwards while focusing on using his left hand/UE to pull himself back and forth. Hand over hand assist provided to left hand to cue active engagement of grasp.                      OT Short Term Goals - 05/16/21 1415       OT SHORT TERM GOAL #1   Title Patient will be educated and independent with HEP completion in order to faciliate his progress in therapy and begin to use his LUE as a gross assist for 25% or more of daily tasks.    Time 6    Period Weeks    Status On-going    Target Date 06/25/21      OT SHORT TERM GOAL #2   Title Patient will demonstrate a 25% increase in A/ROM of the LUE (shoulder, elbow, wrist) as able while using his LUE as a gross assist when completing self care tasks such as dressing.    Time 6    Period Weeks    Status On-going      OT SHORT TERM GOAL #3   Title Patient will demonstrate increased fine motor coordination in his left hand by completing the 9 hole peg test with peg set-up if needed.    Time 6    Period Weeks    Status On-going      OT SHORT TERM GOAL #4   Title Patient will increase is left hand strength to 10# and his pinch strength to 5# in order to begin to use his left hand to stabilize objects when completing self feeding.    Time 6    Period Weeks    Status On-going      OT SHORT TERM GOAL #5   Title Patient will completing bathing and dressing with Mod assist where needed while utilizing DME, Adaptive equipment, and/or compensatory techniques with improved sitting and standing balance.    Time 6    Period Weeks    Status On-going               OT Long Term Goals - 05/16/21 1415       OT LONG TERM GOAL #1   Title Patient will demonstrate improved LUE A/ROM by 50% or more while utilizing his LUE as his non dominant extremity to provide an active assist to complete tasks that require use of bilateral UE's.    Time 12    Period Weeks    Status On-going       OT LONG TERM GOAL #2   Title Patient will increase his LUE strength (shoulder, elbow, wrist) to 4/5 or better while demonstrating improved ability to complete bathing and dressing tasks.  Time 12    Period Weeks    Status On-going      OT LONG TERM GOAL #3   Title Patient will increase his fine motor coordination in his left hand by completing the 9 hole peg test in the standardized fashion under 1'.    Time 12    Period Weeks    Status On-going      OT LONG TERM GOAL #4   Title Patient will demonstrate improved left hand grip strength to 15# and pinch strength to 8# in order to manipulate and hold onto items in his left hand without dropping.    Time 12    Period Weeks    Status On-going      OT LONG TERM GOAL #5   Title Patient will completing bathing and dressing with min assist where needed while utilizing DME, Adaptive equipment, and/or compensatory techniques with improved sitting and standing balance.    Status On-going                   Plan - 05/29/21 1010     Clinical Impression Statement A: Focused session on neuromuscular re-education while standing and requiring the use of the left UE and LE to maintain balance while weightshifting within the parallel bars. Provided tactile and contact guard assist while standing. VC to transfer weight onto left side as patient favors his right side. Was able to activate his left hand to grip and hold the parallel bar when cued although could not maintain grasp for longer than 10-15 seconds.    Body Structure / Function / Physical Skills ADL;Decreased knowledge of use of DME;Strength;Tone;GMC;Balance;UE functional use;Vision;ROM;IADL;Sensation;Coordination;FMC    Plan P: Continue with standing or sitting weightbearing either in or out of parallel bars. Standing to work on waist level reaching at table and start to faciliate functional grasp and release of hand. May need NMES unit to provide active assist.    Consulted and Agree  with Plan of Care Patient             Patient will benefit from skilled therapeutic intervention in order to improve the following deficits and impairments:   Body Structure / Function / Physical Skills: ADL, Decreased knowledge of use of DME, Strength, Tone, GMC, Balance, UE functional use, Vision, ROM, IADL, Sensation, Coordination, [facility]       Visit Diagnosis: Stiffness of left hand, not elsewhere classified  Stiffness of left elbow, not elsewhere classified  Other lack of coordination  Other symptoms and signs involving the musculoskeletal system    Problem List Patient Active Problem List   Diagnosis Date Noted   Urinary frequency    Dysphagia, post-stroke    Spastic hemiplegia affecting nondominant side (HCC)    Drug-induced hypotension    Pressure injury of skin 02/14/2021   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   Dyslipidemia    Hemiparesis affecting left side as late effect of stroke ([place])    Middle cerebral artery embolism, right 02/03/2021   Status post surgery 02/02/2021   Stroke (cerebrum) ([place]) 02/02/2021   Hypoglycemia 01/25/2021   Thyroid nodule 01/25/2021   Hypothyroidism 01/25/2021   Near syncope 01/21/2021   H/O adenomatous polyp of colon 12/30/2020   Acute ischemic stroke ([place]) 11/28/2020   Essential hypertension 11/28/2020   Mixed hyperlipidemia 11/28/2020   Acute CVA (cerebrovascular accident) ([place]) 09/09/2020   Transient ischemic attack 09/07/2020   History of hypertension 09/07/2020   Elevated troponin 09/07/2020   Dizziness 09/07/2020  Numbness and tingling 09/07/2020   H/O medication noncompliance 09/07/2020   Medicare annual wellness visit, subsequent 12/05/2019   Nicotine dependence 12/05/2019   Chronic kidney disease, stage 3 unspecified ([place]) 10/24/2019   Prediabetes 10/24/2019   Edema of foot 09/14/2017   Tubular adenoma of colon 09/14/2017   Long term (current) use of anticoagulants 11/14/2014   Sickle cell  trait ([place]) 08/29/2012   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 [place], [place], [address] Phone: [phone]   Fax:  [phone]  Name: HALL BIRCHARD MRN: [id number] Date of Birth: 26-Sep-1961

## 2021-05-30 ENCOUNTER — Other Ambulatory Visit: Payer: Self-pay

## 2021-05-30 ENCOUNTER — Ambulatory Visit (HOSPITAL_COMMUNITY): Payer: Medicare Other | Attending: Physical Medicine & Rehabilitation

## 2021-05-30 ENCOUNTER — Encounter (HOSPITAL_COMMUNITY): Payer: Self-pay

## 2021-05-30 DIAGNOSIS — R29898 Other symptoms and signs involving the musculoskeletal system: Secondary | ICD-10-CM | POA: Insufficient documentation

## 2021-05-30 DIAGNOSIS — R2689 Other abnormalities of gait and mobility: Secondary | ICD-10-CM | POA: Insufficient documentation

## 2021-05-30 DIAGNOSIS — R262 Difficulty in walking, not elsewhere classified: Secondary | ICD-10-CM | POA: Diagnosis not present

## 2021-05-30 DIAGNOSIS — M25622 Stiffness of left elbow, not elsewhere classified: Secondary | ICD-10-CM | POA: Insufficient documentation

## 2021-05-30 DIAGNOSIS — M6281 Muscle weakness (generalized): Secondary | ICD-10-CM | POA: Diagnosis not present

## 2021-05-30 DIAGNOSIS — R278 Other lack of coordination: Secondary | ICD-10-CM | POA: Diagnosis not present

## 2021-05-30 DIAGNOSIS — M25642 Stiffness of left hand, not elsewhere classified: Secondary | ICD-10-CM | POA: Diagnosis not present

## 2021-05-30 NOTE — Therapy (Signed)
[facility] [address] [place], [place], [address] Phone: [phone]   Fax:  [phone]  Physical Therapy Treatment  Patient Details  Name: Matthew Wise MRN: [id number] Date of Birth: 11-13-1961 Referring Provider (PT): Alysia Penna MD   Encounter Date: 05/30/2021   PT End of Session - 05/30/21 1051     Visit Number 2    Number of Visits 16    Date for PT Re-Evaluation 07/17/21    Authorization Type [employer] M[employer]    Progress Note Due on Visit 10    PT Start Time 1009   Pt late for apt   PT Stop Time 1044    PT Time Calculation (min) 35 min    Equipment Utilized During Treatment Gait belt   WC near   Activity Tolerance Patient tolerated treatment well    Behavior During Therapy [facility] for tasks assessed/performed             Past Medical History:  Diagnosis Date   Acute CVA (cerebrovascular accident) ([place]) 09/09/2020   Chronic anticoagulation 11/14/2014   CKD (chronic kidney disease) stage 3, GFR 30-59 ml/min ([place]) 10/24/2019   CKD (chronic kidney disease) stage 3, GFR 30-59 ml/min ([place]) 10/24/2019   Clotting disorder ([place])    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 two simple tubular adenomas removed. patient was advised to go to [facility] for colonoscopy but he did not keep appt.   IR CT HEAD LTD  02/02/2021   IR PERCUTANEOUS ART THROMBECTOMY/INFUSION INTRACRANIAL INC DIAG ANGIO  02/02/2021   RADIOLOGY  WITH ANESTHESIA N/A 02/02/2021   Procedure: IR WITH ANESTHESIA;  Surgeon: Luanne Bras, MD;  Location: [facility];  Service: Radiology;  Laterality: N/A;   TEE WITHOUT CARDIOVERSION N/A 09/10/2020   Procedure: TRANSESOPHAGEAL ECHOCARDIOGRAM (TEE) WITH PROPOFOL;  Surgeon: Satira Sark, MD;  Location: [facility];  Service: Cardiovascular;  Laterality: N/A;    There were no vitals filed for this visit.   Subjective Assessment - 05/30/21 1011     Subjective Pt arrived with nephew in [facility], stated he is feeling good today.  Has began some of the home exercises.    Pertinent History CVA 02/02/21    Patient Stated Goals Be able to get up and go without walker    Currently in Pain? No/denies                               [facility] Adult PT Treatment/Exercise - 05/30/21 0001       Transfers   Sit to Stand 4: Min assist;Multiple attempts;With upper extremity assist;Other (comment)    Sit to Stand Details Verbal cues for sequencing;Tactile cues for weight shifting;Manual facilitation for weight shifting;Manual facilitation for placement;Manual facilitation for weight bearing    Stand to Sit 4: Min guard;With upper extremity assist;To elevated surface      Ambulation/Gait   Ambulation/Gait Yes  Ambulation/Gait Assistance 3: Mod assist    Ambulation Distance (Feet) 8 Feet    Assistive device Rolling walker    Gait Pattern Decreased step length - right;Decreased stance time - left;Decreased stride length;Decreased hip/knee flexion - left;Decreased dorsiflexion - left;Decreased dorsiflexion - right;Decreased weight shift to left;Ataxic;Poor foot clearance - left    Ambulation Surface Level;Indoor      Exercises   Exercises Knee/Hip      Knee/Hip Exercises: Standing   Other Standing Knee Exercises weight shifting    Other Standing Knee Exercises sidestep inside // bars      Knee/Hip Exercises: Supine   Bridges 2 sets;5 reps      Knee/Hip Exercises: Sidelying   Hip ABduction  Right;10 reps    Clams Both 2x 5 reps 5" holds                    PT Education - 05/30/21 1024     Education Details Reviewed goals, educated importance of HEP compliance for maximal benefits, pt able to recall seated exercises.              PT Short Term Goals - 05/22/21 1641       PT SHORT TERM GOAL #1   Title Patient will be independent with initial HEP and self-management strategies to improve functional outcomes    Time 4    Period Weeks    Status New    Target Date 06/19/21      PT SHORT TERM GOAL #2   Title Patient will be Min A with transfers and bed mobility    Time 4    Period Weeks    Status New    Target Date 06/19/21               PT Long Term Goals - 05/22/21 1642       PT LONG TERM GOAL #1   Title Patient will be Mod I with bed mobility and transfers    Time 8    Period Weeks    Status New    Target Date 07/17/21      PT LONG TERM GOAL #2   Title Patient will be able to ambulate >100 feet with LRAD for improved household mobility    Time 8    Period Weeks    Status New    Target Date 07/17/21      PT LONG TERM GOAL #3   Title Patient will have at least 4/5 MMT throughout LLE (except ankle DF) for improved functional mobility and stair/ step navigation.    Time 8    Period Weeks    Status New    Target Date 07/17/21                   Plan - 05/30/21 1306     Clinical Impression Statement Began session reviewing goals, educated importance of HEP compliance, pt able to recall seated exercises.  Added hip strengthneing supine and sidelying exercises to HEP with verbalized understanding and printout given in folder.  Therapist facilication including verbal and tactile cueing to assist with sit to stand, cueing for Lt foot placement prior to and upon standing with min to mod assistance.  Weight bearing activties complete with manual cueing to increased Lt LE weight bearing.    Examination-Activity Limitations Caring for  Others;Transfers;Locomotion Level;Toileting;Stand    Examination-Participation Restrictions Yard Work;Cleaning;Laundry;Community Activity    Stability/Clinical Decision Making Evolving/Moderate complexity    Clinical Decision Making  Moderate    Rehab Potential Fair    PT Frequency 2x / week    PT Duration 8 weeks    PT Treatment/Interventions ADLs/Self Care Home Management;Aquatic Therapy;Biofeedback;Cryotherapy;Fluidtherapy;Contrast Bath;Electrical Stimulation;Therapeutic exercise;Orthotic Fit/Training;Patient/family education;Manual lymph drainage;Manual techniques;Dry needling;Energy conservation;Spinal Manipulations;Joint Manipulations;Splinting;Taping;Compression bandaging;Vasopneumatic Device;Therapeutic activities;Parrafin;Ultrasound;Traction;Functional mobility training;Neuromuscular re-education;Stair training;Gait training;Moist Heat;Iontophoresis 4mg /ml Dexamethasone;DME Instruction;Balance training;Passive range of motion;Scar mobilization;Wheelchair mobility training;Visual/perceptual remediation/compensation    PT Next Visit Plan Progress functional mobility as tolerated. Seated and mat strengthening, encourage WB through LT side. Standing balance> gait    PT Home Exercise Plan Eval: marches, LAQs, heel/toe raise; 7/1:  bridge and sidelying clam    Consulted and Agree with Plan of Care Patient             Patient will benefit from skilled therapeutic intervention in order to improve the following deficits and impairments:  Abnormal gait, Improper body mechanics, Impaired sensation, Decreased balance, Difficulty walking, Decreased activity tolerance, Decreased range of motion, Decreased strength, Hypomobility, Impaired tone, Decreased mobility, Decreased coordination  Visit Diagnosis: Muscle weakness (generalized)  Difficulty in walking, not elsewhere classified  Other abnormalities of gait and mobility     Problem List Patient Active Problem List   Diagnosis Date Noted    Urinary frequency    Dysphagia, post-stroke    Spastic hemiplegia affecting nondominant side (HCC)    Drug-induced hypotension    Pressure injury of skin 02/14/2021   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   Dyslipidemia    Hemiparesis affecting left side as late effect of stroke ([place])    Middle cerebral artery embolism, right 02/03/2021   Status post surgery 02/02/2021   Stroke (cerebrum) ([place]) 02/02/2021   Hypoglycemia 01/25/2021   Thyroid nodule 01/25/2021   Hypothyroidism 01/25/2021   Near syncope 01/21/2021   H/O adenomatous polyp of colon 12/30/2020   Acute ischemic stroke ([place]) 11/28/2020   Essential hypertension 11/28/2020   Mixed hyperlipidemia 11/28/2020   Acute CVA (cerebrovascular accident) ([place]) 09/09/2020   Transient ischemic attack 09/07/2020   History of hypertension 09/07/2020   Elevated troponin 09/07/2020   Dizziness 09/07/2020   Numbness and tingling 09/07/2020   H/O medication noncompliance 09/07/2020   Medicare annual wellness visit, subsequent 12/05/2019   Nicotine dependence 12/05/2019   Chronic kidney disease, stage 3 unspecified ([place]) 10/24/2019   Prediabetes 10/24/2019   Edema of foot 09/14/2017   Tubular adenoma of colon 09/14/2017   Long term (current) use of anticoagulants 11/14/2014   Sickle cell trait ([place]) 08/29/2012   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 [place], [place], [address] Phone: [phone]   Fax:  [phone]  Name: Matthew Wise MRN: [id number] Date of Birth: [birth year]

## 2021-05-30 NOTE — Patient Instructions (Signed)
Bridging    Slowly raise buttocks from floor, keeping stomach tight. Repeat 10 times per set. Do 1-2 sets per session. Do 3 sessions per day.  http://orth.exer.us/1096   Copyright  VHI. All rights reserved.    Abduction: Clam (Eccentric) - Side-Lying    Lie on side with knees bent. Lift top knee, keeping feet together. Keep trunk steady. Slowly lower for 3-5 seconds.  10 reps per set, 3 sets per day, 6 days per week.  http://ecce.exer.us/64   Copyright  VHI. All rights reserved.

## 2021-06-04 ENCOUNTER — Other Ambulatory Visit: Payer: Self-pay

## 2021-06-04 ENCOUNTER — Ambulatory Visit (HOSPITAL_COMMUNITY): Payer: Medicare Other

## 2021-06-04 ENCOUNTER — Encounter (HOSPITAL_COMMUNITY): Payer: Self-pay

## 2021-06-04 DIAGNOSIS — M6281 Muscle weakness (generalized): Secondary | ICD-10-CM | POA: Diagnosis not present

## 2021-06-04 DIAGNOSIS — R2689 Other abnormalities of gait and mobility: Secondary | ICD-10-CM

## 2021-06-04 DIAGNOSIS — R262 Difficulty in walking, not elsewhere classified: Secondary | ICD-10-CM

## 2021-06-04 DIAGNOSIS — R278 Other lack of coordination: Secondary | ICD-10-CM

## 2021-06-04 DIAGNOSIS — M25622 Stiffness of left elbow, not elsewhere classified: Secondary | ICD-10-CM | POA: Diagnosis not present

## 2021-06-04 DIAGNOSIS — M25642 Stiffness of left hand, not elsewhere classified: Secondary | ICD-10-CM

## 2021-06-04 DIAGNOSIS — R29898 Other symptoms and signs involving the musculoskeletal system: Secondary | ICD-10-CM | POA: Diagnosis not present

## 2021-06-04 NOTE — Therapy (Signed)
[facility] [place], [place], [address] Phone: [phone]   Fax:  [phone]  Occupational Therapy Treatment  Patient Details  Name: Matthew Wise MRN: [id number] Date of Birth: 18-Dec-1960 Referring Provider (OT): Alysia Penna, MD   Encounter Date: 06/04/2021   OT End of Session - 06/04/21 1309     Visit Number 5    Number of Visits 24    Date for OT Re-Evaluation 08/06/21   mini reassess:06/11/21   Authorization Type UHC medicare    Authorization Time Period 11/30/20-end of year. $30 copay. 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 two simple tubular adenomas removed. patient was advised to go to [facility] for colonoscopy but he did not keep appt.   IR CT HEAD LTD  02/02/2021   IR PERCUTANEOUS ART THROMBECTOMY/INFUSION INTRACRANIAL INC DIAG  ANGIO  02/02/2021   RADIOLOGY WITH ANESTHESIA N/A 02/02/2021   Procedure: IR WITH ANESTHESIA;  Surgeon: Luanne Bras, MD;  Location: [facility];  Service: Radiology;  Laterality: N/A;   TEE WITHOUT CARDIOVERSION N/A 09/10/2020   Procedure: TRANSESOPHAGEAL ECHOCARDIOGRAM (TEE) WITH PROPOFOL;  Surgeon: Satira Sark, MD;  Location: [facility];  Service: Cardiovascular;  Laterality: N/A;    There were no vitals filed for this visit.   Subjective Assessment - 06/04/21 1309     Subjective  S: I just stayed at home and listened to the fireworks. Nothing new to report on receiving wheelchair since last session.    Currently in Pain? No/denies                [facility] OT Assessment - 06/04/21 1315       Assessment   Medical Diagnosis S/P right MCA with left hemiparesis      Precautions   Precautions Fall    Precaution Comments left side hemiparesis, left side visual field deficits                      OT Treatments/Exercises (OP) - 06/04/21 1315       Transfers   Transfers Sit to Stand;Stand to Sit    Sit to Stand 4: Min guard    Sit to Stand Details (indicate cue type and reason) Verbal cues with  support provided to lateral side of left knee to prevent abduction    Stand to Sit 4: Min guard;With upper extremity assist    Comments VC for form and technique.      Exercises   Exercises Shoulder      Neurological Re-education Exercises   Grasp and Release Thumb Opposition    Other Grasp and Release Exercises  With use of NMES and OT providing Active assist, patient attempted to reach and extend wrist and hand with on ramp for sponge then grasped sponge during on ramp of flexion while completing shoulder extension and abduction to pusll sponge towards him and to the left side of table.    Weight Bearing Position Standing    Standing with weight shifting on and off With left elbow and wrist extended and palm on table, OT provided support to LUE while patient completed  weightbearing into LUE for 10 seconds, 5 trials completed.    Development of Reach Towel slides    Towel Slides standing at table with hand placed on washcloth, patient completed AA/ROM shoulder flexion and extension 10X followed by horizontal abduction and adduction 10X. 2 sets completed with rest breaks provided. Visual and verbal cues provided for target and form and technique.      Modalities   Modalities Teacher, English as a foreign language Location two channels used. Channel 1: Wrist and finger extensors Channel 2: wrist and finger flexors    Electrical Stimulation Action Engineer, petroleum Parameters extensors: 39 mA CC flexors: 42 mA CC 5/5 ramp    Electrical Stimulation Goals Neuromuscular facilitation      Weight Bearing Technique   Weight Bearing Technique Yes                      OT Short Term Goals - 05/16/21 1415       OT SHORT TERM GOAL #1   Title Patient will be educated and independent with HEP completion in order to faciliate his progress in therapy and begin to use his LUE as a gross assist for 25% or more of daily tasks.    Time 6    Period Weeks    Status On-going    Target Date 06/25/21      OT SHORT TERM GOAL #2   Title Patient will demonstrate a 25% increase in A/ROM of the LUE (shoulder, elbow, wrist) as able while using his LUE as a gross assist when completing self care tasks such as dressing.    Time 6    Period Weeks    Status On-going      OT SHORT TERM GOAL #3   Title Patient will demonstrate increased fine motor coordination in his left hand by completing the 9 hole peg test with peg set-up if needed.    Time 6    Period Weeks    Status On-going      OT SHORT TERM GOAL #4   Title Patient will increase is left hand strength to 10# and his pinch strength to 5# in order to begin to use his left hand to stabilize objects when completing self feeding.    Time 6    Period Weeks     Status On-going      OT SHORT TERM GOAL #5   Title Patient will completing bathing and dressing with Mod assist where needed while utilizing DME, Adaptive equipment, and/or compensatory techniques with improved sitting  and standing balance.    Time 6    Period Weeks    Status On-going               OT Long Term Goals - 05/16/21 1415       OT LONG TERM GOAL #1   Title Patient will demonstrate improved LUE A/ROM by 50% or more while utilizing his LUE as his non dominant extremity to provide an active assist to complete tasks that require use of bilateral UE's.    Time 12    Period Weeks    Status On-going      OT LONG TERM GOAL #2   Title Patient will increase his LUE strength (shoulder, elbow, wrist) to 4/5 or better while demonstrating improved ability to complete bathing and dressing tasks.    Time 12    Period Weeks    Status On-going      OT LONG TERM GOAL #3   Title Patient will increase his fine motor coordination in his left hand by completing the 9 hole peg test in the standardized fashion under 1'.    Time 12    Period Weeks    Status On-going      OT LONG TERM GOAL #4   Title Patient will demonstrate improved left hand grip strength to 15# and pinch strength to 8# in order to manipulate and hold onto items in his left hand without dropping.    Time 12    Period Weeks    Status On-going      OT LONG TERM GOAL #5   Title Patient will completing bathing and dressing with min assist where needed while utilizing DME, Adaptive equipment, and/or compensatory techniques with improved sitting and standing balance.    Status On-going                   Plan - 06/04/21 1322     Clinical Impression Statement A: With use of NMES unit, patient was able to achieve good extension and flexion of his left hand. Index finger and thumb were not able to demonstrate as much flexion as other digits. Continues to required VC for form and technique during sit to stand and  stand to sit transitions.    Body Structure / Function / Physical Skills ADL;Decreased knowledge of use of DME;Strength;Tone;GMC;Balance;UE functional use;Vision;ROM;IADL;Sensation;Coordination;FMC    Plan P: continue with NMES unit initially to hand for extension and flexion. Complete standing table slides to focus on shoulder movement. Attempt waist level reaching task.    Consulted and Agree with Plan of Care Patient             Patient will benefit from skilled therapeutic intervention in order to improve the following deficits and impairments:   Body Structure / Function / Physical Skills: ADL, Decreased knowledge of use of DME, Strength, Tone, GMC, Balance, UE functional use, Vision, ROM, IADL, Sensation, Coordination, [facility]       Visit Diagnosis: Stiffness of left hand, not elsewhere classified  Stiffness of left elbow, not elsewhere classified  Other lack of coordination    Problem List Patient Active Problem List   Diagnosis Date Noted   Urinary frequency    Dysphagia, post-stroke    Spastic hemiplegia affecting nondominant side (HCC)    Drug-induced hypotension    Pressure injury of skin 02/14/2021   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   Dyslipidemia    Hemiparesis affecting left side as late effect of stroke ([place])  Middle cerebral artery embolism, right 02/03/2021   Status post surgery 02/02/2021   Stroke (cerebrum) ([place]) 02/02/2021   Hypoglycemia 01/25/2021   Thyroid nodule 01/25/2021   Hypothyroidism 01/25/2021   Near syncope 01/21/2021   H/O adenomatous polyp of colon 12/30/2020   Acute ischemic stroke ([place]) 11/28/2020   Essential hypertension 11/28/2020   Mixed hyperlipidemia 11/28/2020   Acute CVA (cerebrovascular accident) ([place]) 09/09/2020   Transient ischemic attack 09/07/2020   History of hypertension 09/07/2020   Elevated troponin 09/07/2020   Dizziness 09/07/2020   Numbness and tingling 09/07/2020   H/O medication  noncompliance 09/07/2020   Medicare annual wellness visit, subsequent 12/05/2019   Nicotine dependence 12/05/2019   Chronic kidney disease, stage 3 unspecified ([place]) 10/24/2019   Prediabetes 10/24/2019   Edema of foot 09/14/2017   Tubular adenoma of colon 09/14/2017   Long term (current) use of anticoagulants 11/14/2014   Sickle cell trait ([place]) 08/29/2012   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 [place], [place], [address] Phone: [phone]   Fax:  [phone]  Name: Matthew Wise MRN: [id number] Date of Birth: 1961-07-26

## 2021-06-04 NOTE — Therapy (Signed)
[place], [place], [address] Phone: [phone]   Fax:  [phone]  Physical Therapy Treatment  Patient Details  Name: Matthew Wise MRN: [id number] Date of Birth: January 12, 1961 Referring Provider (PT): Alysia Penna MD   Encounter Date: 06/04/2021   PT End of Session - 06/04/21 1035     Visit Number 3    Number of Visits 16    Date for PT Re-Evaluation 07/17/21    Authorization Type [employer]    Progress Note Due on Visit 10    PT Start Time 1005    PT Stop Time 1100    PT Time Calculation (min) 55 min    Equipment Utilized During Treatment Gait belt   WC near   Activity Tolerance Patient tolerated treatment well    Behavior During Therapy [facility] for tasks assessed/performed             Past Medical History:  Diagnosis Date   Acute CVA (cerebrovascular accident) ([place]) 09/09/2020   Chronic anticoagulation 11/14/2014   CKD (chronic kidney disease) stage 3, GFR 30-59 ml/min ([place]) 10/24/2019   CKD (chronic kidney disease) stage 3, GFR 30-59 ml/min ([place]) 10/24/2019   Clotting disorder ([place])    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 two simple tubular adenomas removed. patient was advised to go to [facility] for colonoscopy but he did not keep appt.   IR CT HEAD LTD  02/02/2021   IR PERCUTANEOUS ART THROMBECTOMY/INFUSION INTRACRANIAL INC DIAG ANGIO  02/02/2021   RADIOLOGY WITH ANESTHESIA  N/A 02/02/2021   Procedure: IR WITH ANESTHESIA;  Surgeon: Luanne Bras, MD;  Location: [place];  Service: Radiology;  Laterality: N/A;   TEE WITHOUT CARDIOVERSION N/A 09/10/2020   Procedure: TRANSESOPHAGEAL ECHOCARDIOGRAM (TEE) WITH PROPOFOL;  Surgeon: Satira Sark, MD;  Location: [facility];  Service: Cardiovascular;  Laterality: N/A;    There were no vitals filed for this visit.   Subjective Assessment - 06/04/21 1036     Subjective Arrived for session, no new issues noted.  Motivated to attempt standing/walking    Pertinent History CVA 02/02/21    Patient Stated Goals Be able to get up and go without walker                [facility] PT Assessment - 06/04/21 0001       Assessment   Medical Diagnosis S/P right MCA with left hemiparesis    Referring Provider (PT) Alysia Penna MD    Onset Date/Surgical Date 02/02/21    Hand Dominance Right    Next MD Visit 06/20/21    Prior Therapy Yes                           [place] Adult PT Treatment/Exercise - 06/04/21 0001       Transfers   Sit to Stand 4: Min assist;Multiple attempts;With upper extremity assist;Other (comment)  Sit to Stand Details Verbal cues for sequencing;Tactile cues for weight shifting;Manual facilitation for weight shifting;Manual facilitation for placement;Manual facilitation for weight bearing    Stand to Sit 4: Min guard;With upper extremity assist;To elevated surface      Neuro Re-ed    Neuro Re-ed Details  K-tape applied to left tib anterior for facilitation. Static standing with mirror for feedback and trials in mid-line orientation with therapist blocking left knee for extension and cues for trunk extension and lateral shift to pelvis with tapered assistance to improve self-initiation for 60-90 sec trials with onset of fatigue. Weight shifting with therapist blocking left knee extension while pt performs RLE limb advancement 5x10 reps. Static standing without UE support and therapist  blocking left knee for extension with trials in achieving mid line orientation. Seated lateral flexion left/right 2x10 to improve trunk strength and righting reactions                      PT Short Term Goals - 05/22/21 1641       PT SHORT TERM GOAL #1   Title Patient will be independent with initial HEP and self-management strategies to improve functional outcomes    Time 4    Period Weeks    Status New    Target Date 06/19/21      PT SHORT TERM GOAL #2   Title Patient will be Min A with transfers and bed mobility    Time 4    Period Weeks    Status New    Target Date 06/19/21               PT Long Term Goals - 05/22/21 1642       PT LONG TERM GOAL #1   Title Patient will be Mod I with bed mobility and transfers    Time 8    Period Weeks    Status New    Target Date 07/17/21      PT LONG TERM GOAL #2   Title Patient will be able to ambulate >100 feet with LRAD for improved household mobility    Time 8    Period Weeks    Status New    Target Date 07/17/21      PT LONG TERM GOAL #3   Title Patient will have at least 4/5 MMT throughout LLE (except ankle DF) for improved functional mobility and stair/ step navigation.    Time 8    Period Weeks    Status New    Target Date 07/17/21                   Plan - 06/04/21 1104     Clinical Impression Statement Tolerated tx session very well and demo improved ability to achieve mid line orientation by end of session with 25% tactile and visual cues for alignment.  Techniuqes to improve left attention well received and encouraged to over exagerate left visual field attention with resulting improved awareness and maintenance of mid line position.  Continued sessions indicated to improve LLE function, postural stability, and facilitate motor control to progress with transfer and gait activities.  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 and may benefit from bracing intervention in the future     Examination-Activity Limitations Caring for Others;Transfers;Locomotion Level;Toileting;Stand    Examination-Participation Restrictions Yard Work;Cleaning;Laundry;Community Activity    Stability/Clinical Decision Making Evolving/Moderate complexity    Rehab Potential Fair    PT Frequency 2x / week  PT Duration 8 weeks    PT Treatment/Interventions ADLs/Self Care Home Management;Aquatic Therapy;Biofeedback;Cryotherapy;Fluidtherapy;Contrast Bath;Electrical Stimulation;Therapeutic exercise;Orthotic Fit/Training;Patient/family education;Manual lymph drainage;Manual techniques;Dry needling;Energy conservation;Spinal Manipulations;Joint Manipulations;Splinting;Taping;Compression bandaging;Vasopneumatic Device;Therapeutic activities;Parrafin;Ultrasound;Traction;Functional mobility training;Neuromuscular re-education;Stair training;Gait training;Moist Heat;Iontophoresis 4mg /ml Dexamethasone;DME Instruction;Balance training;Passive range of motion;Scar mobilization;Wheelchair mobility training;Visual/perceptual remediation/compensation    PT Next Visit Plan Progress functional mobility as tolerated. Seated and mat strengthening, encourage WB through LT side. Standing balance> gait    PT Home Exercise Plan Eval: marches, LAQs, heel/toe raise; 7/1:  bridge and sidelying clam    Consulted and Agree with Plan of Care Patient             Patient will benefit from skilled therapeutic intervention in order to improve the following deficits and impairments:  Abnormal gait, Improper body mechanics, Impaired sensation, Decreased balance, Difficulty walking, Decreased activity tolerance, Decreased range of motion, Decreased strength, Hypomobility, Impaired tone, Decreased mobility, Decreased coordination  Visit Diagnosis: Muscle weakness (generalized)  Difficulty in walking, not elsewhere classified  Other abnormalities of gait and mobility     Problem List Patient Active Problem List   Diagnosis Date  Noted   Urinary frequency    Dysphagia, post-stroke    Spastic hemiplegia affecting nondominant side (HCC)    Drug-induced hypotension    Pressure injury of skin 02/14/2021   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)    Middle cerebral artery embolism, right 02/03/2021   Status post surgery 02/02/2021   Stroke (cerebrum) ([place]) 02/02/2021   Hypoglycemia 01/25/2021   Thyroid nodule 01/25/2021   Hypothyroidism 01/25/2021   Near syncope 01/21/2021   H/O adenomatous polyp of colon 12/30/2020   Acute ischemic stroke ([place]) 11/28/2020   Essential hypertension 11/28/2020   Mixed hyperlipidemia 11/28/2020   Acute CVA (cerebrovascular accident) ([place]) 09/09/2020   Transient ischemic attack 09/07/2020   History of hypertension 09/07/2020   Elevated troponin 09/07/2020   Dizziness 09/07/2020   Numbness and tingling 09/07/2020   H/O medication noncompliance 09/07/2020   Medicare annual wellness visit, subsequent 12/05/2019   Nicotine dependence 12/05/2019   Chronic kidney disease, stage 3 unspecified ([place]) 10/24/2019   Prediabetes 10/24/2019   Edema of foot 09/14/2017   Tubular adenoma of colon 09/14/2017   Long term (current) use of anticoagulants 11/14/2014   Sickle cell trait ([place]) 08/29/2012   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 Sherlyn Lees, PT, DPT Physical Therapist- [place] Office Number: [phone]   [place] [address] [place], [place], [address] Phone: [phone]   Fax:  [phone]  Name: ALMANDO BRAWLEY MRN: [id number] Date of Birth: March 28, 1961

## 2021-06-05 ENCOUNTER — Encounter: Payer: Self-pay | Admitting: Internal Medicine

## 2021-06-05 ENCOUNTER — Ambulatory Visit: Payer: Medicare Other | Admitting: Internal Medicine

## 2021-06-06 ENCOUNTER — Other Ambulatory Visit: Payer: Self-pay

## 2021-06-06 ENCOUNTER — Ambulatory Visit (HOSPITAL_COMMUNITY): Payer: Medicare Other | Admitting: Physical Therapy

## 2021-06-06 ENCOUNTER — Encounter (HOSPITAL_COMMUNITY): Payer: Self-pay | Admitting: Physical Therapy

## 2021-06-06 ENCOUNTER — Encounter (HOSPITAL_COMMUNITY): Payer: Self-pay

## 2021-06-06 ENCOUNTER — Ambulatory Visit (HOSPITAL_COMMUNITY): Payer: Medicare Other

## 2021-06-06 DIAGNOSIS — M6281 Muscle weakness (generalized): Secondary | ICD-10-CM

## 2021-06-06 DIAGNOSIS — M25622 Stiffness of left elbow, not elsewhere classified: Secondary | ICD-10-CM | POA: Diagnosis not present

## 2021-06-06 DIAGNOSIS — R278 Other lack of coordination: Secondary | ICD-10-CM

## 2021-06-06 DIAGNOSIS — R2689 Other abnormalities of gait and mobility: Secondary | ICD-10-CM

## 2021-06-06 DIAGNOSIS — R29898 Other symptoms and signs involving the musculoskeletal system: Secondary | ICD-10-CM | POA: Diagnosis not present

## 2021-06-06 DIAGNOSIS — R262 Difficulty in walking, not elsewhere classified: Secondary | ICD-10-CM | POA: Diagnosis not present

## 2021-06-06 DIAGNOSIS — M25642 Stiffness of left hand, not elsewhere classified: Secondary | ICD-10-CM | POA: Diagnosis not present

## 2021-06-06 NOTE — Therapy (Signed)
[place], [place], [address] Phone: [phone]   Fax:  [phone]  Occupational Therapy Treatment  Patient Details  Name: Matthew Wise MRN: [id number] Date of Birth: 1961/03/11 Referring Provider (OT): Alysia Penna, MD   Encounter Date: 06/06/2021   OT End of Session - 06/06/21 1208     Visit Number 6    Number of Visits 24    Date for OT Re-Evaluation 08/06/21   mini reassess:06/11/21   Authorization Type UHC medicare    Authorization Time Period 11/30/20-end of year. $30 copay. 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 two simple tubular adenomas removed. patient was advised to go to [facility] for colonoscopy but he did not keep appt.   IR CT HEAD LTD  02/02/2021   IR PERCUTANEOUS ART THROMBECTOMY/INFUSION INTRACRANIAL INC DIAG  ANGIO  02/02/2021   RADIOLOGY WITH ANESTHESIA N/A 02/02/2021   Procedure: IR WITH ANESTHESIA;  Surgeon: Luanne Bras, MD;  Location: [place];  Service: Radiology;  Laterality: N/A;   TEE WITHOUT CARDIOVERSION N/A 09/10/2020   Procedure: TRANSESOPHAGEAL ECHOCARDIOGRAM (TEE) WITH PROPOFOL;  Surgeon: Satira Sark, MD;  Location: [facility];  Service: Cardiovascular;  Laterality: N/A;    There were no vitals filed for this visit.   Subjective Assessment - 06/06/21 1148     Subjective  S: my loaner chair broke.The wheel came off where it's welded    Currently in Pain? No/denies                [facility] OT Assessment - 06/06/21 1149       Assessment   Medical Diagnosis S/P right MCA with left hemiparesis      Precautions   Precautions Fall    Precaution Comments left side hemiparesis, left side visual field deficits                      OT Treatments/Exercises (OP) - 06/06/21 1149       Exercises   Exercises Shoulder      Neurological Re-education Exercises   Grasp and Release Thumb Opposition    Other Grasp and Release Exercises  With use of NMES and OT providing Active assist, patient attempted to reach and  extend wrist and hand with on ramp for sponge then grasped sponge during on ramp of flexion while completing shoulder extension and abduction to pusll sponge towards him and to the left side of table.    Development of Reach Towel slides;Reaching    Towel Slides Seated with table lowered to waist level patient completed active assist shoulder flexion while hand was on washcloth. Required moderate assist to provide er of shoulder when completing extension.    Reaching to Waist standing with left hand resting on basketball. 4 cone targets were placed on table. Two for flexion, one to the left and one to the right.      Modalities   Modalities Teacher, English as a foreign language Location two channels used. Channel 1:  Wrist and finger extensors Channel 2: wrist and finger flexors    Electrical Stimulation Action Engineer, petroleum Parameters extensors and flexors both at 32 mA CC    Electrical Stimulation Goals Neuromuscular facilitation                      OT Short Term Goals - 05/16/21 1415       OT SHORT TERM GOAL #1   Title Patient will be educated and independent with HEP completion in order to faciliate his progress in therapy and begin to use his LUE as a gross assist for 25% or more of daily tasks.    Time 6    Period Weeks    Status On-going    Target Date 06/25/21      OT SHORT TERM GOAL #2   Title Patient will demonstrate a 25% increase in A/ROM of the LUE (shoulder, elbow, wrist) as able while using his LUE as a gross assist when completing self care tasks such as dressing.    Time 6    Period Weeks    Status On-going      OT SHORT TERM GOAL #3   Title Patient will demonstrate increased fine motor coordination in his left hand by completing the 9 hole peg test with peg set-up if needed.    Time 6    Period Weeks    Status On-going      OT SHORT TERM GOAL #4   Title Patient will increase is left hand strength to 10# and his pinch strength to 5# in order to begin to use his left hand to stabilize objects when completing self feeding.    Time 6    Period Weeks    Status On-going      OT SHORT TERM GOAL #5   Title Patient will completing bathing and dressing with Mod assist where needed while utilizing DME, Adaptive equipment, and/or compensatory techniques with improved sitting and standing balance.    Time 6    Period Weeks    Status On-going               OT Long Term Goals - 05/16/21 1415       OT LONG TERM GOAL #1   Title Patient will demonstrate improved LUE A/ROM by 50% or more while utilizing his LUE as his non dominant extremity to provide an active assist to complete tasks that require use of bilateral UE's.    Time 12    Period  Weeks    Status On-going      OT LONG TERM GOAL #2   Title Patient will increase his LUE strength (  shoulder, elbow, wrist) to 4/5 or better while demonstrating improved ability to complete bathing and dressing tasks.    Time 12    Period Weeks    Status On-going      OT LONG TERM GOAL #3   Title Patient will increase his fine motor coordination in his left hand by completing the 9 hole peg test in the standardized fashion under 1'.    Time 12    Period Weeks    Status On-going      OT LONG TERM GOAL #4   Title Patient will demonstrate improved left hand grip strength to 15# and pinch strength to 8# in order to manipulate and hold onto items in his left hand without dropping.    Time 12    Period Weeks    Status On-going      OT LONG TERM GOAL #5   Title Patient will completing bathing and dressing with min assist where needed while utilizing DME, Adaptive equipment, and/or compensatory techniques with improved sitting and standing balance.    Status On-going                   Plan - 06/06/21 1208     Clinical Impression Statement A: NMES continues to provide great active assist with finger and wrist extension. Flexion of fingers and wrist is demonstrated with NMES is minimal although present. Unable to pinch and hold soft sponges for more than 1-2 trials. Focused on shoulder stability standing while hand was placed on basketball. Did receive min-mod assist to re-adjust although patient did demonstrate active control when provided verbal and visual cues.    Body Structure / Function / Physical Skills ADL;Decreased knowledge of use of DME;Strength;Tone;GMC;Balance;UE functional use;Vision;ROM;IADL;Sensation;Coordination;FMC    Plan P: COntinue with use of NMES unit to hand to increase extension and flexion. Continue with AA/ROM of the shoulder either standing or sitting.    Consulted and Agree with Plan of Care Patient             Patient will benefit from skilled  therapeutic intervention in order to improve the following deficits and impairments:   Body Structure / Function / Physical Skills: ADL, Decreased knowledge of use of DME, Strength, Tone, GMC, Balance, UE functional use, Vision, ROM, IADL, Sensation, Coordination, [facility]       Visit Diagnosis: Stiffness of left hand, not elsewhere classified  Stiffness of left elbow, not elsewhere classified  Other symptoms and signs involving the musculoskeletal system  Other lack of coordination    Problem List Patient Active Problem List   Diagnosis Date Noted   Urinary frequency    Dysphagia, post-stroke    Spastic hemiplegia affecting nondominant side (HCC)    Drug-induced hypotension    Pressure injury of skin 02/14/2021   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   Dyslipidemia    Hemiparesis affecting left side as late effect of stroke ([place])    Middle cerebral artery embolism, right 02/03/2021   Status post surgery 02/02/2021   Stroke (cerebrum) ([place]) 02/02/2021   Hypoglycemia 01/25/2021   Thyroid nodule 01/25/2021   Hypothyroidism 01/25/2021   Near syncope 01/21/2021   H/O adenomatous polyp of colon 12/30/2020   Acute ischemic stroke ([place]) 11/28/2020   Essential hypertension 11/28/2020   Mixed hyperlipidemia 11/28/2020   Acute CVA (cerebrovascular accident) ([place]) 09/09/2020   Transient ischemic attack 09/07/2020   History of hypertension 09/07/2020   Elevated troponin 09/07/2020   Dizziness 09/07/2020   Numbness and tingling  09/07/2020   H/O medication noncompliance 09/07/2020   Medicare annual wellness visit, subsequent 12/05/2019   Nicotine dependence 12/05/2019   Chronic kidney disease, stage 3 unspecified ([place]) 10/24/2019   Prediabetes 10/24/2019   Edema of foot 09/14/2017   Tubular adenoma of colon 09/14/2017   Long term (current) use of anticoagulants 11/14/2014   Sickle cell trait ([place]) 08/29/2012   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

## 2021-06-06 NOTE — Therapy (Signed)
[place], [place], [address] Phone: [phone]   Fax:  [phone]  Physical Therapy Treatment  Patient Details  Name: Matthew Wise MRN: [id number] Date of Birth: 1961/01/29 Referring Provider (PT): Alysia Penna MD   Encounter Date: 06/06/2021   PT End of Session - 06/06/21 1128     Visit Number 4    Number of Visits 16    Date for PT Re-Evaluation 07/17/21    Authorization Type [employer]    Progress Note Due on Visit 10    PT Start Time 1128    PT Stop Time 1208    PT Time Calculation (min) 40 min    Equipment Utilized During Treatment Gait belt   WC near   Activity Tolerance Patient tolerated treatment well    Behavior During Therapy [facility] for tasks assessed/performed             Past Medical History:  Diagnosis Date   Acute CVA (cerebrovascular accident) ([place]) 09/09/2020   Chronic anticoagulation 11/14/2014   CKD (chronic kidney disease) stage 3, GFR 30-59 ml/min ([place]) 10/24/2019   CKD (chronic kidney disease) stage 3, GFR 30-59 ml/min ([place]) 10/24/2019   Clotting disorder ([place])    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 two simple tubular adenomas removed. patient was advised to go to [facility] for colonoscopy but he did not keep appt.   IR CT HEAD LTD  02/02/2021   IR PERCUTANEOUS ART THROMBECTOMY/INFUSION INTRACRANIAL INC DIAG ANGIO  02/02/2021   RADIOLOGY WITH ANESTHESIA  N/A 02/02/2021   Procedure: IR WITH ANESTHESIA;  Surgeon: Luanne Bras, MD;  Location: [place];  Service: Radiology;  Laterality: N/A;   TEE WITHOUT CARDIOVERSION N/A 09/10/2020   Procedure: TRANSESOPHAGEAL ECHOCARDIOGRAM (TEE) WITH PROPOFOL;  Surgeon: Satira Sark, MD;  Location: [facility];  Service: Cardiovascular;  Laterality: N/A;    There were no vitals filed for this visit.   Subjective Assessment - 06/06/21 1135     Subjective States that he doesn't have any difficulties with his exercises. States that he was a little unsure about the tape on his leg secondary to his history of wounds on his legs. Denied having it removed on this date. Reports no pain.    Pertinent History CVA 02/02/21    Patient Stated Goals Be able to get up and go without walker                [facility] PT Assessment - 06/06/21 1133       Assessment   Medical Diagnosis S/P right MCA with left hemiparesis    Referring Provider (PT) Alysia Penna MD                           [facility] Adult PT Treatment/Exercise - 06/06/21 0001       Bed Mobility   Bed Mobility Sit to Supine;Supine to Sit  Supine to Sit Moderate Assistance - Patient 50-74%    Sit to Supine Moderate Assistance - Patient 50-74%      Transfers   Transfers Sit to Stand;Stand to Sit    Sit to Stand 4: Min guard    Sit to Stand Details Tactile cues for weight shifting;Tactile cues for posture;Verbal cues for safe use of DME/AE;Manual facilitation for placement      Knee/Hip Exercises: Standing   Other Standing Knee Exercises weight shifting in // bars - 8 minutes - cues to extend left knee      Knee/Hip Exercises: Supine   Bridges 5 reps;3 sets   PT manual assist and facilitation.   Other Supine Knee/Hip Exercises Hip IR - 5" holds x20 total left                    PT Education - 06/06/21 1213     Education Details on setting up legs  with sitting and not letting legs flop to the side    Person(s)  Educated Patient    Methods Explanation    Comprehension Verbalized understanding              PT Short Term Goals - 05/22/21 1641       PT SHORT TERM GOAL #1   Title Patient will be independent with initial HEP and self-management strategies to improve functional outcomes    Time 4    Period Weeks    Status New    Target Date 06/19/21      PT SHORT TERM GOAL #2   Title Patient will be Min A with transfers and bed mobility    Time 4    Period Weeks    Status New    Target Date 06/19/21               PT Long Term Goals - 05/22/21 1642       PT LONG TERM GOAL #1   Title Patient will be Mod I with bed mobility and transfers    Time 8    Period Weeks    Status New    Target Date 07/17/21      PT LONG TERM GOAL #2   Title Patient will be able to ambulate >100 feet with LRAD for improved household mobility    Time 8    Period Weeks    Status New    Target Date 07/17/21      PT LONG TERM GOAL #3   Title Patient will have at least 4/5 MMT throughout LLE (except ankle DF) for improved functional mobility and stair/ step navigation.    Time 8    Period Weeks    Status New    Target Date 07/17/21                   Plan - 06/06/21 1304     Clinical Impression Statement Continued to work on transfers, standing, and bed mobilities. Required tactile and physical assist for all and focused on activating hip adductors and internal rotators during movements to keep hip from falling into ER/abd. Patient verbalized understanding. Added supine hip ir to HEP which patient performed well independently but did fatigue quickly with this exercise.    Examination-Activity Limitations Caring for Others;Transfers;Locomotion Level;Toileting;Stand    Examination-Participation Restrictions Yard Work;Cleaning;Laundry;Community Activity    Stability/Clinical Decision Making Evolving/Moderate complexity    Rehab Potential Fair    PT Frequency 2x / week    PT  Duration 8 weeks     PT Treatment/Interventions ADLs/Self Care Home Management;Aquatic Therapy;Biofeedback;Cryotherapy;Fluidtherapy;Contrast Bath;Electrical Stimulation;Therapeutic exercise;Orthotic Fit/Training;Patient/family education;Manual lymph drainage;Manual techniques;Dry needling;Energy conservation;Spinal Manipulations;Joint Manipulations;Splinting;Taping;Compression bandaging;Vasopneumatic Device;Therapeutic activities;Parrafin;Ultrasound;Traction;Functional mobility training;Neuromuscular re-education;Stair training;Gait training;Moist Heat;Iontophoresis 4mg /ml Dexamethasone;DME Instruction;Balance training;Passive range of motion;Scar mobilization;Wheelchair mobility training;Visual/perceptual remediation/compensation    PT Next Visit Plan Progress functional mobility as tolerated. Seated and mat strengthening, encourage WB through LT side. Standing balance> gait    PT Home Exercise Plan Eval: marches, LAQs, heel/toe raise; 7/1:  bridge and sidelying clam    Consulted and Agree with Plan of Care Patient             Patient will benefit from skilled therapeutic intervention in order to improve the following deficits and impairments:  Abnormal gait, Improper body mechanics, Impaired sensation, Decreased balance, Difficulty walking, Decreased activity tolerance, Decreased range of motion, Decreased strength, Hypomobility, Impaired tone, Decreased mobility, Decreased coordination  Visit Diagnosis: Muscle weakness (generalized)  Difficulty in walking, not elsewhere classified  Other abnormalities of gait and mobility     Problem List Patient Active Problem List   Diagnosis Date Noted   Urinary frequency    Dysphagia, post-stroke    Spastic hemiplegia affecting nondominant side (HCC)    Drug-induced hypotension    Pressure injury of skin 02/14/2021   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   Dyslipidemia    Hemiparesis affecting left side as late effect of stroke ([place])    Middle  cerebral artery embolism, right 02/03/2021   Status post surgery 02/02/2021   Stroke (cerebrum) ([place]) 02/02/2021   Hypoglycemia 01/25/2021   Thyroid nodule 01/25/2021   Hypothyroidism 01/25/2021   Near syncope 01/21/2021   H/O adenomatous polyp of colon 12/30/2020   Acute ischemic stroke ([place]) 11/28/2020   Essential hypertension 11/28/2020   Mixed hyperlipidemia 11/28/2020   Acute CVA (cerebrovascular accident) ([place]) 09/09/2020   Transient ischemic attack 09/07/2020   History of hypertension 09/07/2020   Elevated troponin 09/07/2020   Dizziness 09/07/2020   Numbness and tingling 09/07/2020   H/O medication noncompliance 09/07/2020   Medicare annual wellness visit, subsequent 12/05/2019   Nicotine dependence 12/05/2019   Chronic kidney disease, stage 3 unspecified ([place]) 10/24/2019   Prediabetes 10/24/2019   Edema of foot 09/14/2017   Tubular adenoma of colon 09/14/2017   Long term (current) use of anticoagulants 11/14/2014   Sickle cell trait ([place]) 08/29/2012   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

## 2021-06-08 ENCOUNTER — Other Ambulatory Visit (INDEPENDENT_AMBULATORY_CARE_PROVIDER_SITE_OTHER): Payer: Self-pay | Admitting: Internal Medicine

## 2021-06-08 DIAGNOSIS — E038 Other specified hypothyroidism: Secondary | ICD-10-CM

## 2021-06-09 ENCOUNTER — Encounter: Payer: Self-pay | Admitting: Neurology

## 2021-06-09 ENCOUNTER — Ambulatory Visit: Payer: Medicare Other | Admitting: Neurology

## 2021-06-09 ENCOUNTER — Other Ambulatory Visit: Payer: Self-pay

## 2021-06-09 VITALS — BP 103/70 | HR 68 | Ht 72.0 in | Wt 236.0 lb

## 2021-06-09 DIAGNOSIS — G8110 Spastic hemiplegia affecting unspecified side: Secondary | ICD-10-CM | POA: Diagnosis not present

## 2021-06-09 DIAGNOSIS — I82409 Acute embolism and thrombosis of unspecified deep veins of unspecified lower extremity: Secondary | ICD-10-CM

## 2021-06-09 DIAGNOSIS — I63511 Cerebral infarction due to unspecified occlusion or stenosis of right middle cerebral artery: Secondary | ICD-10-CM | POA: Diagnosis not present

## 2021-06-09 NOTE — Progress Notes (Signed)
[facility] [address] [place]. [place] [address] [phone]       OFFICE CONSULT NOTE  [name] Date of Birth:  May 05, 1961 Medical Record Number:  [id number]   Referring MD:  Matthew Shores, PA-c  Reason for Referral:  Stroke  HPI: Matthew Wise is a 60 year old African-American male seen today for initial office consultation visit for stroke.  He is accompanied by his son today.  History is obtained from them and review of electronic medical records and I personally reviewed pertinent available imaging films in PACS.  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  He presented on 02/02/2021 with sudden onset of left facial droop and left hemiparesis.  He was seen by telemetry neurology and CT angiogram showed right MCA M1 filling defect and perfusion deficit hence was transferred to [facility] for consideration for endovascular treatment.  CT perfusion showed 57 cc of penumbra Patient was taken for emergent thrombectomy with revascularization of occluded right MCA M1 segment with 2 passes of Solitaire stent retriever device and aspiration resulting in TICI 2 c revascularization.  He was kept in the ICU and blood pressure strictly monitored.  MRI scan showed acute large right MCA infarct with petechial hemorrhage with minimum leftward shift there were also small acute infarcts noted in the left cerebellum and left occipital lobe as well as left parietal and left frontal lobes but does not clear whether these were periprocedural.  2D echo done previously on 01/22/2021 had shown left ventricular ejection fraction of 55 to 60% with no significant regional wall motion abnormalities.  There was  left atrial dilatation noted.  LDL cholesterol 25 mg percent and hemoglobin A1c was 5.9.  Patient had previous  history of DVT and pulmonary embolism and lower extremity venous ultrasound showed age-indeterminate determinate left femoral and popliteal thrombus.  Patient had been on Eliquis prior to this admission and it was switched to Pradaxa 150 twice daily and as daily was also continued.  Patient had the seizure-like activity noted during the hospitalization and even though EEG had no correlation there is twitchings were thought to be focal motor seizures which are difficult to capture an EEG and patient is started on Keppra.  Patient had a recent previous admission in February 2022 and MRI had shown right posterior temporal and parietal infarct in the inferior division MCA territory as well as acute infarction left cerebellum and right pontine tegmentum.  Previously TCD bubble study on 12/02/2020 was positive with delayed hits noted after Valsalva.  He had previously had a 30-day heart monitor in October 2021 which had not shown any significant atrial fibrillation has not arrhythmias.  Loop recorder was not placed since patient was already a candidate for long-term anticoagulation due to history of DVT and pulmonary embolism.  Patient was transferred to inpatient rehab where he made gradual improvement and is currently living at home.  He is getting outpatient therapy now and has finished home therapies.  Is able to walk with a walker with somebody has to be close to him.  He still has significant residual left hemiparesis he can barely extend his left elbow and move his left hip but continues to have left foot drop.  He is tolerating Pradaxa and aspirin without bruising or bleeding.  Blood pressure is under good control in fact recently tends to run low and today  it is 103/70.  Is tolerating Lipitor well without muscle aches and pains.  He has had no new recurrent stroke symptoms and has no new complaints today. 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   Prediabetes 10/24/2019   Thyroid nodule 01/25/2021    Social History:  Social History   Socioeconomic History   Marital status: Divorced    Spouse name: Not on file   Number of children: 2   Years of education: 12   Highest education level: Not on file  Occupational History   Occupation: disability    Comment: DVT  Tobacco Use   Smoking status: Former    Packs/day: 1.00    Years: 30.00    Pack years: 30.00    Types: Cigarettes    Start date: 12/01/1983   Smokeless tobacco: Never  Vaping Use   Vaping Use: Never used  Substance and Sexual Activity   Alcohol use: Not Currently    Alcohol/week: 3.0 standard drinks    Types: 3 Cans of beer per week    Comment: has not drank sice 11/27/20   Drug use: Not Currently    Comment: REMOTE HISTORY, Quit in 2007   Sexual activity: Yes  Other Topics Concern   Not on file  Social History Narrative   Lives with sister   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   Right Handed    Drinks caffeine on occassion   Social Determinants of Health   Financial Resource Strain: Not on file  Food Insecurity: Not on file  Transportation Needs: Not on file  Physical Activity: Not on file  Stress: Not on file  Social Connections:  Not on file  Intimate Partner Violence: Not on file    Medications:   Current Outpatient Medications on File Prior to Visit  Medication Sig Dispense Refill   acetaminophen (TYLENOL) 325 MG tablet Take 2 tablets (650 mg total) by mouth every 4 (four) hours as needed for mild pain (or temp > 37.5 C (99.5 F)).     amLODipine (NORVASC) 5 MG tablet Take 1 tablet by mouth daily.     aspirin EC 81 MG EC tablet Take 1 tablet (81 mg total) by mouth daily. Swallow whole. 30 tablet 11   atorvastatin (LIPITOR) 40 MG tablet Take 1 tablet (40 mg total) by mouth daily. 90 tablet 0   dabigatran (PRADAXA) 150 MG CAPS capsule Take 1 capsule (150 mg total) by mouth every 12 (twelve) hours. 60 capsule 3   docusate sodium (COLACE) 100 MG capsule Take 1 capsule (100 mg total) by mouth daily. 10 capsule 0   hydrALAZINE (APRESOLINE) 50 MG tablet Take 1 tablet (50 mg total) by  mouth every 8 (eight) hours. 90 tablet 3   levETIRAcetam (KEPPRA) 500 MG tablet Take 1 tablet (500 mg total) by mouth 2 (two) times daily. 60 tablet 3   levothyroxine (SYNTHROID) 25 MCG tablet TAKE 1 TABLET BY MOUTH EVERY DAY 90 tablet 0   lisinopril (ZESTRIL) 20 MG tablet Take 1 tablet (20 mg total) by mouth daily. 30 tablet 3   pantoprazole (PROTONIX) 40 MG tablet Take 1 tablet (40 mg total) by mouth at bedtime. 30 tablet 3   senna-docusate (SENOKOT-S) 8.6-50 MG tablet Take 1 tablet by mouth at bedtime.     tamsulosin (FLOMAX) 0.4 MG CAPS capsule Take 1 capsule (0.4 mg total) by mouth daily after supper. 30 capsule 3   ciprofloxacin (CIPRO) 500 MG tablet Take 1 tablet (500 mg total) by mouth 2 (two) times daily. 10 tablet 0   No current facility-administered medications on file prior to visit.    Allergies:  No Known Allergies  Physical Exam General: Mildly obese middle-aged African-American male, seated, in no evident distress Head: head normocephalic and atraumatic.   Neck: supple with no carotid or supraclavicular  bruits Cardiovascular: regular rate and rhythm, no murmurs Musculoskeletal: no deformity Skin:  no rash/petichiae Vascular:  Normal pulses all extremities  Neurologic Exam Mental Status: Awake and fully alert. Oriented to place and time. Recent and remote memory intact. Attention span, concentration and fund of knowledge appropriate. Mood and affect appropriate.  Cranial Nerves: Fundoscopic exam reveals sharp disc margins. Pupils equal, briskly reactive to light. Extraocular movements full without nystagmus. Visual fields show partial left homonymous hemianopsia to confrontation. Hearing intact. Facial sensation intact. Face, tongue, palate moves normally and symmetrically.  Motor: Spastic left hemiplegia with grade 1/5 strength in the left upper and lower extremity.  Is able to extend his left elbow slightly move left shoulder.  He can similarly do some hip abduction and adduction only.  Left foot drop.  Tone is increased on the left with spasticity compared to the right.  Normal strength in the leg. Sensory.:  Diminished left hemibody sensation to touch , pinprick , position and vibratory sensation.  Coordination: Rapid alternating movements normal in all extremities. Finger-to-nose and heel-to-shin performed accurately bilaterally. Gait and Station: Deferred as patient is sitting in a wheelchair and did not bring his walker  reflexes: 2+ and asymmetric and brisker on the left. Toes downgoing.   NIHSS  7 Modified Rankin  4   ASSESSMENT: 60 year old African-American male with recurrent right middle cerebral artery infarct treated with mechanical thrombectomy in March 2022 without significant clinical improvement with residual significant spastic left hemiplegia.  He also had symptomatic focal motor seizures which are stable on the current medication regimen of Keppra.  Vascular risk factors of hypertension, hyperlipidemia , smoking, obesity, sleep apnea and marijuana use and intracranial  stenosis.     PLAN: I had a long d/w patient and his son about his recent recurrent stroke, and spastic left hemiplegia, risk for recurrent stroke/TIAs, personally independently reviewed imaging studies and stroke evaluation results and answered questions.Continue aspirin 81 mg daily  and Pradaxa 150 mg twice daily given h/o DVT for secondary stroke prevention and maintain strict control of hypertension with blood pressure goal below 130/90, diabetes with hemoglobin A1c goal below 6.5% and lipids with LDL cholesterol goal below 70 mg/dL. I also advised the patient to eat a healthy diet with plenty of whole grains, cereals, fruits and vegetables, exercise regularly and maintain ideal body weight .he was advised to use  his walker at all times and continue ongoing outpatient physical and occupational therapy.  Patient was counseled not to smoke cigarettes or marijuana.  Continue Keppra for seizure prophylaxis.  Followup in the future with my nurse practitioner Janett Billow in 6 months or call earlier if necessary.  Greater than 50% time during this 50-minute consultation visit was spent on counseling and coordination of care about his recurrent right MCA strokes and discussion with son planning and coordination of care and answering questions. Antony Contras, MD  Note: This document was prepared with digital dictation and possible smart phrase technology. Any transcriptional errors that result from this process are unintentional.

## 2021-06-09 NOTE — Patient Instructions (Signed)
I had a long d/w patient and his son about his recent recurrent stroke, and spastic left hemiplegia, risk for recurrent stroke/TIAs, personally independently reviewed imaging studies and stroke evaluation results and answered questions.Continue aspirin 81 mg daily  and Pradaxa 150 mg twice daily given h/o DVT for secondary stroke prevention and maintain strict control of hypertension with blood pressure goal below 130/90, diabetes with hemoglobin A1c goal below 6.5% and lipids with LDL cholesterol goal below 70 mg/dL. I also advised the patient to eat a healthy diet with plenty of whole grains, cereals, fruits and vegetables, exercise regularly and maintain ideal body weight .he was advised to use his walker at all times and continue ongoing outpatient physical and occupational therapy.  Followup in the future with my nurse practitioner Janett Billow in 6 months or call earlier if necessary. Stroke Prevention Some medical conditions and behaviors are associated with a higher chance of having a stroke. You can help prevent a stroke by making nutrition, lifestyle,and other changes, including managing any medical conditions you may have. What nutrition changes can be made?  Eat healthy foods. You can do this by: Choosing foods high in fiber, such as fresh fruits and vegetables and whole grains. Eating at least 5 or more servings of fruits and vegetables a day. Try to fill half of your plate at each meal with fruits and vegetables. Choosing lean protein foods, such as lean cuts of meat, poultry without skin, fish, tofu, beans, and nuts. Eating low-fat dairy products. Avoiding foods that are high in salt (sodium). This can help lower blood pressure. Avoiding foods that have saturated fat, trans fat, and cholesterol. This can help prevent high cholesterol. Avoiding processed and premade foods. Follow your health care provider's specific guidelines for losing weight, controlling high blood pressure (hypertension),  lowering high cholesterol, and managing diabetes. These may include: Reducing your daily calorie intake. Limiting your daily sodium intake to 1,500 milligrams (mg). Using only healthy fats for cooking, such as olive oil, canola oil, or sunflower oil. Counting your daily carbohydrate intake. What lifestyle changes can be made? Maintain a healthy weight. Talk to your health care provider about your ideal weight. Get at least 30 minutes of moderate physical activity at least 5 days a week. Moderate activity includes brisk walking, biking, and swimming. Do not use any products that contain nicotine or tobacco, such as cigarettes and e-cigarettes. If you need help quitting, ask your health care provider. It may also be helpful to avoid exposure to secondhand smoke. Limit alcohol intake to no more than 1 drink a day for nonpregnant women and 2 drinks a day for men. One drink equals 12 oz of beer, 5 oz of wine, or 1 oz of hard liquor. Stop any illegal drug use. Avoid taking birth control pills. Talk to your health care provider about the risks of taking birth control pills if: You are over 53 years old. You smoke. You get migraines. You have ever had a blood clot. What other changes can be made? Manage your cholesterol levels. Eating a healthy diet is important for preventing high cholesterol. If cholesterol cannot be managed through diet alone, you may also need to take medicines. Take any prescribed medicines to control your cholesterol as told by your health care provider. Manage your diabetes. Eating a healthy diet and exercising regularly are important parts of managing your blood sugar. If your blood sugar cannot be managed through diet and exercise, you may need to take medicines. Take any prescribed medicines  to control your diabetes as told by your health care provider. Control your hypertension. To reduce your risk of stroke, try to keep your blood pressure below 130/80. Eating a healthy  diet and exercising regularly are an important part of controlling your blood pressure. If your blood pressure cannot be managed through diet and exercise, you may need to take medicines. Take any prescribed medicines to control hypertension as told by your health care provider. Ask your health care provider if you should monitor your blood pressure at home. Have your blood pressure checked every year, even if your blood pressure is normal. Blood pressure increases with age and some medical conditions. Get evaluated for sleep disorders (sleep apnea). Talk to your health care provider about getting a sleep evaluation if you snore a lot or have excessive sleepiness. Take over-the-counter and prescription medicines only as told by your health care provider. Aspirin or blood thinners (antiplatelets or anticoagulants) may be recommended to reduce your risk of forming blood clots that can lead to stroke. Make sure that any other medical conditions you have, such as atrial fibrillation or atherosclerosis, are managed. What are the warning signs of a stroke? The warning signs of a stroke can be easily remembered as BEFAST. B is for balance. Signs include: Dizziness. Loss of balance or coordination. Sudden trouble walking. E is for eyes. Signs include: A sudden change in vision. Trouble seeing. F is for face. Signs include: Sudden weakness or numbness of the face. The face or eyelid drooping to one side. A is for arms. Signs include: Sudden weakness or numbness of the arm, usually on one side of the body. S is for speech. Signs include: Trouble speaking (aphasia). Trouble understanding. T is for time. These symptoms may represent a serious problem that is an emergency. Do not wait to see if the symptoms will go away. Get medical help right away. Call your local emergency services (911 in the [place] Do not drive yourself to the hospital. Other signs of stroke may include: A sudden, severe headache  with no known cause. Nausea or vomiting. Seizure. Where to find more information For more information, visit: American Stroke Association: www.strokeassociation.org National Stroke Association: www.stroke.org Summary You can prevent a stroke by eating healthy, exercising, not smoking, limiting alcohol intake, and managing any medical conditions you may have. Do not use any products that contain nicotine or tobacco, such as cigarettes and e-cigarettes. If you need help quitting, ask your health care provider. It may also be helpful to avoid exposure to secondhand smoke. Remember BEFAST for warning signs of stroke. Get help right away if you or a loved one has any of these signs. This information is not intended to replace advice given to you by your health care provider. Make sure you discuss any questions you have with your healthcare provider. Document Revised: 10/29/2017 Document Reviewed: 12/22/2016 Elsevier Patient Education  2021 [employer].

## 2021-06-10 ENCOUNTER — Ambulatory Visit (HOSPITAL_COMMUNITY): Payer: Medicare Other | Admitting: Physical Therapy

## 2021-06-10 ENCOUNTER — Encounter (HOSPITAL_COMMUNITY): Payer: Self-pay | Admitting: Physical Therapy

## 2021-06-10 ENCOUNTER — Other Ambulatory Visit: Payer: Self-pay

## 2021-06-10 ENCOUNTER — Ambulatory Visit (HOSPITAL_COMMUNITY): Payer: Medicare Other

## 2021-06-10 DIAGNOSIS — M25642 Stiffness of left hand, not elsewhere classified: Secondary | ICD-10-CM

## 2021-06-10 DIAGNOSIS — R278 Other lack of coordination: Secondary | ICD-10-CM

## 2021-06-10 DIAGNOSIS — M6281 Muscle weakness (generalized): Secondary | ICD-10-CM | POA: Diagnosis not present

## 2021-06-10 DIAGNOSIS — R29898 Other symptoms and signs involving the musculoskeletal system: Secondary | ICD-10-CM | POA: Diagnosis not present

## 2021-06-10 DIAGNOSIS — R2689 Other abnormalities of gait and mobility: Secondary | ICD-10-CM

## 2021-06-10 DIAGNOSIS — M25622 Stiffness of left elbow, not elsewhere classified: Secondary | ICD-10-CM

## 2021-06-10 DIAGNOSIS — R262 Difficulty in walking, not elsewhere classified: Secondary | ICD-10-CM | POA: Diagnosis not present

## 2021-06-10 NOTE — Patient Instructions (Signed)
Access Code: [email or web address] URL: [email or web address] Date: 06/10/2021 Prepared by: Josue Hector  Exercises Supine Heel Slide - 2-3 x daily - 7 x weekly - 2 sets - 10 reps Supine Hip Abduction - 2-3 x daily - 7 x weekly - 2 sets - 10 reps

## 2021-06-11 ENCOUNTER — Encounter (INDEPENDENT_AMBULATORY_CARE_PROVIDER_SITE_OTHER): Payer: Self-pay | Admitting: Internal Medicine

## 2021-06-11 ENCOUNTER — Encounter (HOSPITAL_COMMUNITY): Payer: Self-pay

## 2021-06-11 ENCOUNTER — Ambulatory Visit (INDEPENDENT_AMBULATORY_CARE_PROVIDER_SITE_OTHER): Payer: Medicare Other | Admitting: Internal Medicine

## 2021-06-11 VITALS — BP 96/64 | HR 42 | Temp 97.5°F | Ht 72.0 in | Wt 233.0 lb

## 2021-06-11 DIAGNOSIS — L03115 Cellulitis of right lower limb: Secondary | ICD-10-CM

## 2021-06-11 DIAGNOSIS — I952 Hypotension due to drugs: Secondary | ICD-10-CM

## 2021-06-11 MED ORDER — LISINOPRIL 10 MG PO TABS: 10.0000 mg | ORAL_TABLET | Freq: Every day | ORAL | 1 refills | Status: DC

## 2021-06-11 MED ORDER — AMOXICILLIN-POT CLAVULANATE 875-125 MG PO TABS: 1.0000 | ORAL_TABLET | Freq: Two times a day (BID) | ORAL | 0 refills | Status: DC

## 2021-06-11 NOTE — Progress Notes (Signed)
   06/10/21 0001  Knee/Hip Exercises: Seated  Long Arc Quad Left;1 set;15 reps  Long Arc Quad Weight 2 lbs.  Other Seated Knee/Hip Exercises LLE object step over x 10 over purple dumbbell  Sit to Sand 2 sets;5 reps;with UE support  Knee/Hip Exercises: Supine  Bridges 1 set;10 reps  Other Supine Knee/Hip Exercises bed mobility LT and RT rolling x 2 each  Bridges Limitations assist for foot placment  Heel Slides AAROM;1 set;10 reps  Heel Slides Limitations therapist assist  Other Supine Knee/Hip Exercises supine hip abduction/ adduction x10

## 2021-06-11 NOTE — Therapy (Signed)
[facility] [address] [place], [place], [address] Phone: [phone]   Fax:  [phone]  Occupational Therapy Treatment Progress note Patient Details  Name: Matthew Wise MRN: [id number] Date of Birth: 1961-01-26 Referring Provider (OT): Alysia Penna, MD   Progress Note Reporting Period 05/14/21 to 06/10/21  See note below for Objective Data and Assessment of Progress/Goals.      Encounter Date: 06/10/2021   OT End of Session - 06/11/21 1108     Visit Number 7    Number of Visits 24    Date for OT Re-Evaluation 08/06/21   mini reassess 07/08/21   Authorization Type [employer] medicare    Authorization Time Period 11/30/20-end of year. $30 copay. No visit limit    Progress Note Due on Visit 53    OT Start Time 1653   Pt arrived late   OT Stop Time 1727    OT Time Calculation (min) 34 min    Activity Tolerance Patient tolerated treatment well    Behavior During Therapy WFL for tasks assessed/performed             Past Medical History:  Diagnosis Date   Acute CVA (cerebrovascular accident) ([place]) 09/09/2020   Chronic anticoagulation 11/14/2014   CKD (chronic kidney disease) stage 3, GFR 30-59 ml/min ([place]) 10/24/2019   CKD (chronic kidney disease) stage 3, GFR 30-59 ml/min ([place]) 10/24/2019   Clotting disorder ([place])    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 two simple tubular adenomas removed. patient  was advised to go to [facility] for colonoscopy but he did not keep appt.   IR CT HEAD LTD  02/02/2021   IR PERCUTANEOUS ART THROMBECTOMY/INFUSION INTRACRANIAL INC DIAG ANGIO  02/02/2021   RADIOLOGY WITH ANESTHESIA N/A 02/02/2021   Procedure: IR WITH ANESTHESIA;  Surgeon: Luanne Bras, MD;  Location: [facility];  Service: Radiology;  Laterality: N/A;   TEE WITHOUT CARDIOVERSION N/A 09/10/2020   Procedure: TRANSESOPHAGEAL ECHOCARDIOGRAM (TEE) WITH PROPOFOL;  Surgeon: Satira Sark, MD;  Location: [facility];  Service: Cardiovascular;  Laterality: N/A;    There were no vitals filed for this visit.   Subjective Assessment - 06/11/21 1052     Subjective  S: This chair is mine. I got it from a friend that didn't need it anymore.    Patient is accompanied by: Family member   Nephew   Currently in Pain? No/denies                [facility] OT Assessment - 06/10/21 1656       Assessment   Medical Diagnosis S/P right MCA with left hemiparesis    Referring Provider (OT) Alysia Penna, MD      Precautions   Precautions Fall    Precaution Comments left side hemiparesis, left side visual field deficits      Posture/Postural Control   Posture/Postural Control Postural limitations    Postural Limitations Rounded Shoulders;Forward head;Increased lumbar  lordosis;Posterior pelvic tilt;Flexed trunk;Weight shift right      Perception   Perception Impaired    Inattention/Neglect Impaired - to be further tested in functual context      ROM / Strength   AROM / PROM / Strength AROM;Strength;PROM      AROM   Overall AROM  Deficits    Overall AROM Comments Assessed supine initially then seated. IR/er adducted.    AROM Assessment Site Shoulder;Elbow;Forearm;Wrist;Finger    Right/Left Shoulder Left    Left Shoulder Flexion 20 Degrees   eval: no active movement seated or supine. supine today: no active movement   Left Shoulder ABduction 20 Degrees   eval: no active movement seated or supine. today supine:  no active movement   Left Shoulder Internal Rotation 49 Degrees   eval: no active movement seated or supine. today supine: no active movement   Left Shoulder External Rotation 0 Degrees   eval no active movement seated or supine. today supine: same   Right/Left Elbow Left    Left Elbow Flexion 123   eval: 110 todaysupine: full ROM - achieved full ROM on eval also.   Left Elbow Extension -35    Right/Left Forearm Left    Left Forearm Pronation 90 Degrees   eval: Used gravity to assist with bringing forearm to 90 degrees   Left Forearm Supination 52 Degrees   eval: when starting from pronation position, pt was able to achieve supination to 0 degrees.   Right/Left Wrist Left    Left Wrist Extension 34 Degrees   eval: When seated achieved 10 degrees in gravity eliminated plane   Left Wrist Flexion 30 Degrees   eval: achieved 10 degrees in gravity eliminated plane   Right/Left Finger Left    Left Composite Finger Extension 50%   eval; 25%   Left Composite Finger Flexion 50%   eval; same   Right/Left Thumb Left    Left Thumb Opposition Digit 3   eval; digit 2     PROM   Overall PROM  Deficits    Overall PROM Comments assessed supine. Shoulder flexion: 90 is limit due to pain, abduction 90 is limit due to pain, er: 25, wrist extension: 38   on eval: Pain noted at 90 degrees shoulder flexions, external rotation is tight and painful when slightly more than 0 degrees and shoulder adducted. full elbow flexion and extension. full wrist flexion. increased tone noted with wrist extension.     Strength   Overall Strength Comments Attempted to assess grip and pinch strength with OT providing hand set up. Pt was unable to demonstrate enough strength to register on devices.    Strength Assessment Site --    Right/Left hand Left    Left Hand Gross Grasp Impaired    Left Hand Grip (lbs) 0    Left Hand Lateral Pinch 0 lbs    Left Hand 3 Point Pinch 0 lbs      LUE Tone   LUE Tone Modified Ashworth       LUE Tone   Modified Ashworth Scale for Grading Hypertonia LUE More marked increase in muscle tone through most of the ROM, but affected part(s) easily moved                      OT Treatments/Exercises (OP) - 06/11/21 1053       Bed Mobility   Bed Mobility Rolling Right;Rolling Left;Left Sidelying to Sit;Sit to Supine    Rolling Right Minimal  Assistance - Patient > 75%    Rolling Left Minimal Assistance - Patient > 75%    Left Sidelying to Sit Maximal Assistance - Patient 25-49%    Sit to Supine Contact Guard/Touching assist      Transfers   Transfers Sit to Stand;Stand to Sit    Sit to Stand 4: Min guard    Sit to Stand Details (indicate cue type and reason) VC for form and technique. RW used    Stand to [employer] 4: Min guard;With upper extremity assist    Comments VC for form and technique. RW used.      Neurological Re-education Exercises   Weight Bearing Position --   prone   Other Weight-Bearing Exercises 1 Prone on large mat while wieghtbearing into bilateral elbows. Held position for 10 seconds; 2 trials completed.                      OT Short Term Goals - 05/16/21 1415       OT SHORT TERM GOAL #1   Title Patient will be educated and independent with HEP completion in order to faciliate his progress in therapy and begin to use his LUE as a gross assist for 25% or more of daily tasks.    Time 6    Period Weeks    Status On-going    Target Date 06/25/21      OT SHORT TERM GOAL #2   Title Patient will demonstrate a 25% increase in A/ROM of the LUE (shoulder, elbow, wrist) as able while using his LUE as a gross assist when completing self care tasks such as dressing.    Time 6    Period Weeks    Status On-going      OT SHORT TERM GOAL #3   Title Patient will demonstrate increased fine motor coordination in his left hand by completing the 9 hole peg test with peg set-up if needed.    Time 6    Period Weeks    Status On-going      OT SHORT TERM  GOAL #4   Title Patient will increase is left hand strength to 10# and his pinch strength to 5# in order to begin to use his left hand to stabilize objects when completing self feeding.    Time 6    Period Weeks    Status On-going      OT SHORT TERM GOAL #5   Title Patient will completing bathing and dressing with Mod assist where needed while utilizing DME, Adaptive equipment, and/or compensatory techniques with improved sitting and standing balance.    Time 6    Period Weeks    Status On-going               OT Long Term Goals - 05/16/21 1415       OT LONG TERM GOAL #1   Title Patient will demonstrate improved LUE A/ROM by 50% or more while utilizing his LUE as his non dominant extremity to provide an active assist to complete tasks that require use of bilateral UE's.    Time 12    Period Weeks    Status On-going      OT LONG TERM GOAL #2   Title Patient will increase his LUE strength (shoulder, elbow, wrist) to 4/5 or better while demonstrating improved ability to complete bathing and dressing tasks.    Time 12    Period Weeks    Status On-going  OT LONG TERM GOAL #3   Title Patient will increase his fine motor coordination in his left hand by completing the 9 hole peg test in the standardized fashion under 1'.    Time 12    Period Weeks    Status On-going      OT LONG TERM GOAL #4   Title Patient will demonstrate improved left hand grip strength to 15# and pinch strength to 8# in order to manipulate and hold onto items in his left hand without dropping.    Time 12    Period Weeks    Status On-going      OT LONG TERM GOAL #5   Title Patient will completing bathing and dressing with min assist where needed while utilizing DME, Adaptive equipment, and/or compensatory techniques with improved sitting and standing balance.    Status On-going                   Plan - 06/11/21 1111     Clinical Impression Statement A: Progress note completed this date as  patient has been in therapy for the past 4 weeks. We have focused on NM re-education to the left UE with use of kinsiotaping to decrease shoulder subluxation, weightbearing techniques, weightshifting activities to increase safety while standing and proprioception of left side of body. Patient is able to demonstrate the start of new active movement within the shoulder, forearm and wrist. He required tactile and verbal cues during session to carry over techniques related to transfers and sit to stands. Cueing needed to include left hand/UE at times with encouragement to complete tasks independently initiately before receiving. help. Pt continues to receive assistance from his sitter for all basic ADL tasks. He is still completing dressing either at bed level or seated on EOB. Bed baths are utilized as patient did not receive a tub bench for bathing. OT discussed reaching out to a local organization that may be able to assist him with receiving a tub bench.    Body Structure / Function / Physical Skills ADL;Decreased knowledge of use of DME;Strength;Tone;GMC;Balance;UE functional use;Vision;ROM;IADL;Sensation;Coordination;FMC    Plan P: Reach out to [facility] and inquire about getting a tub bench for patient to increase bathing performance. Next session focus on simulation tasks to increase dressing (UB and LB). Review use of compensatory techniques and/or adaptive equipment if needed.    Consulted and Agree with Plan of Care Patient             Patient will benefit from skilled therapeutic intervention in order to improve the following deficits and impairments:   Body Structure / Function / Physical Skills: ADL, Decreased knowledge of use of DME, Strength, Tone, GMC, Balance, UE functional use, Vision, ROM, IADL, Sensation, Coordination, [facility]       Visit Diagnosis: Stiffness of left hand, not elsewhere classified  Stiffness of left elbow, not elsewhere classified  Other lack of  coordination  Other symptoms and signs involving the musculoskeletal system    Problem List Patient Active Problem List   Diagnosis Date Noted   Urinary frequency    Dysphagia, post-stroke    Spastic hemiplegia affecting nondominant side ([facility]CC)    Drug-induced hypotension    Pressure injury of skin 02/14/2021   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   Dyslipidemia    Hemiparesis affecting left side as late effect of stroke ([place])    Middle cerebral artery embolism, right 02/03/2021   Status post surgery 02/02/2021   Stroke (  cerebrum) ([place]) 02/02/2021   Hypoglycemia 01/25/2021   Thyroid nodule 01/25/2021   Hypothyroidism 01/25/2021   Near syncope 01/21/2021   H/O adenomatous polyp of colon 12/30/2020   Acute ischemic stroke ([place]) 11/28/2020   Essential hypertension 11/28/2020   Mixed hyperlipidemia 11/28/2020   Acute CVA (cerebrovascular accident) ([place]) 09/09/2020   Transient ischemic attack 09/07/2020   History of hypertension 09/07/2020   Elevated troponin 09/07/2020   Dizziness 09/07/2020   Numbness and tingling 09/07/2020   H/O medication noncompliance 09/07/2020   Medicare annual wellness visit, subsequent 12/05/2019   Nicotine dependence 12/05/2019   Chronic kidney disease, stage 3 unspecified ([place]) 10/24/2019   Prediabetes 10/24/2019   Edema of foot 09/14/2017   Tubular adenoma of colon 09/14/2017   Long term (current) use of anticoagulants 11/14/2014   Sickle cell trait ([place]) 08/29/2012   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 [place], [place], [address] Phone: [phone]   Fax:  [phone]  Name: Matthew Wise MRN: [id number] Date of Birth: 08/01/1961

## 2021-06-11 NOTE — Therapy (Signed)
[facility] [address] [place], [place], [address] Phone: [phone]   Fax:  [phone]  Physical Therapy Treatment  Patient Details  Name: Matthew Wise MRN: [id number] Date of Birth: 04/21/1961 Referring Provider (PT): Alysia Penna MD   Encounter Date: 06/10/2021   PT End of Session - 06/10/21 1733     Visit Number 5    Number of Visits 16    Date for PT Re-Evaluation 07/17/21    Authorization Type [employer]    Progress Note Due on Visit 10    PT Start Time 6195    PT Stop Time 1810    PT Time Calculation (min) 39 min    Equipment Utilized During Treatment Gait belt   WC near   Activity Tolerance Patient tolerated treatment well    Behavior During Therapy [facility] for tasks assessed/performed             Past Medical History:  Diagnosis Date   Acute CVA (cerebrovascular accident) ([place]) 09/09/2020   Chronic anticoagulation 11/14/2014   CKD (chronic kidney disease) stage 3, GFR 30-59 ml/min ([place]) 10/24/2019   CKD (chronic kidney disease) stage 3, GFR 30-59 ml/min ([place]) 10/24/2019   Clotting disorder ([place])    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 two simple tubular adenomas removed. patient was advised to go to [facility] for colonoscopy but he did not keep appt.   IR CT HEAD LTD  02/02/2021   IR PERCUTANEOUS ART THROMBECTOMY/INFUSION INTRACRANIAL INC DIAG ANGIO  02/02/2021   RADIOLOGY WITH ANESTHESIA  N/A 02/02/2021   Procedure: IR WITH ANESTHESIA;  Surgeon: Luanne Bras, MD;  Location: [facility];  Service: Radiology;  Laterality: N/A;   TEE WITHOUT CARDIOVERSION N/A 09/10/2020   Procedure: TRANSESOPHAGEAL ECHOCARDIOGRAM (TEE) WITH PROPOFOL;  Surgeon: Satira Sark, MD;  Location: [facility];  Service: Cardiovascular;  Laterality: N/A;    There were no vitals filed for this visit.   Subjective Assessment - 06/10/21 1733     Subjective No new issues.    Pertinent History CVA 02/02/21    Patient Stated Goals Be able to get up and go without walker    Currently in Pain? No/denies               06/10/21 0001  Knee/Hip Exercises: Seated  Long Arc Quad Left;1 set;15 reps  Long Arc Quad Weight 2 lbs.  Other Seated Knee/Hip Exercises LLE object step over x 10 over purple dumbbell  Sit to Sand 2 sets;5 reps;with UE support  Knee/Hip Exercises: Supine  Bridges 1 set;10 reps  Other Supine Knee/Hip Exercises bed mobility LT and RT rolling x 2 each  Bridges Limitations assist for foot placment  Heel Slides AAROM;1 set;10 reps  Heel Slides Limitations therapist assist  Other Supine Knee/Hip Exercises supine hip abduction/ adduction x10       PT Short Term Goals - 05/22/21 1641       PT  SHORT TERM GOAL #1   Title Patient will be independent with initial HEP and self-management strategies to improve functional outcomes    Time 4    Period Weeks    Status New    Target Date 06/19/21      PT SHORT TERM GOAL #2   Title Patient will be Min A with transfers and bed mobility    Time 4    Period Weeks    Status New    Target Date 06/19/21               PT Long Term Goals - 05/22/21 1642       PT LONG TERM GOAL #1   Title Patient will be Mod I with bed mobility and transfers    Time 8    Period Weeks    Status New    Target Date 07/17/21      PT LONG TERM GOAL #2   Title Patient will be able to ambulate >100 feet with LRAD for improved household mobility    Time 8     Period Weeks    Status New    Target Date 07/17/21      PT LONG TERM GOAL #3   Title Patient will have at least 4/5 MMT throughout LLE (except ankle DF) for improved functional mobility and stair/ step navigation.    Time 8    Period Weeks    Status New    Target Date 07/17/21                   Plan - 06/11/21 0949     Clinical Impression Statement Patient showing steady progress. Continued work on bed mobility and transfers. Added LE strengthening on mat. Patient requires active assist for improved ROM with hip and knee exercise. Patient requires continued verbal and tactile cueing for improved WB through LLE with sit to stand transfers. Improving quad activation with weighted LAQs. Patient will continue to benefit from strength progressions for improved functional mobility.    Examination-Activity Limitations Caring for Others;Transfers;Locomotion Level;Toileting;Stand    Examination-Participation Restrictions Yard Work;Cleaning;Laundry;Community Activity    Stability/Clinical Decision Making Evolving/Moderate complexity    Rehab Potential Fair    PT Frequency 2x / week    PT Duration 8 weeks    PT Treatment/Interventions ADLs/Self Care Home Management;Aquatic Therapy;Biofeedback;Cryotherapy;Fluidtherapy;Contrast Bath;Electrical Stimulation;Therapeutic exercise;Orthotic Fit/Training;Patient/family education;Manual lymph drainage;Manual techniques;Dry needling;Energy conservation;Spinal Manipulations;Joint Manipulations;Splinting;Taping;Compression bandaging;Vasopneumatic Device;Therapeutic activities;Parrafin;Ultrasound;Traction;Functional mobility training;Neuromuscular re-education;Stair training;Gait training;Moist Heat;Iontophoresis 4mg /ml Dexamethasone;DME Instruction;Balance training;Passive range of motion;Scar mobilization;Wheelchair mobility training;Visual/perceptual remediation/compensation    PT Next Visit Plan Progress functional mobility as tolerated. Seated and  mat strengthening, encourage WB through LT side. Standing balance> gait    PT Home Exercise Plan Eval: marches, LAQs, heel/toe raise; 7/1:  bridge and sidelying clam 7/12 heel slide, supine hip abduction    Consulted and Agree with Plan of Care Patient             Patient will benefit from skilled therapeutic intervention in order to improve the following deficits and impairments:  Abnormal gait, Improper body mechanics, Impaired sensation, Decreased balance, Difficulty walking, Decreased activity tolerance, Decreased range of motion, Decreased strength, Hypomobility, Impaired tone, Decreased mobility, Decreased coordination  Visit Diagnosis: Muscle weakness (generalized)  Difficulty in walking, not elsewhere classified  Other abnormalities of gait and mobility     Problem List Patient Active Problem List   Diagnosis Date Noted   Urinary frequency    Dysphagia, post-stroke    Spastic hemiplegia  affecting nondominant side ([place])    Drug-induced hypotension    Pressure injury of skin 02/14/2021   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   Dyslipidemia    Hemiparesis affecting left side as late effect of stroke [facility])    Middle cerebral artery embolism, right 02/03/2021   Status post surgery 02/02/2021   Stroke (cerebrum) ([place]) 02/02/2021   Hypoglycemia 01/25/2021   Thyroid nodule 01/25/2021   Hypothyroidism 01/25/2021   Near syncope 01/21/2021   H/O adenomatous polyp of colon 12/30/2020   Acute ischemic stroke ([place]) 11/28/2020   Essential hypertension 11/28/2020   Mixed hyperlipidemia 11/28/2020   Acute CVA (cerebrovascular accident) ([place]) 09/09/2020   Transient ischemic attack 09/07/2020   History of hypertension 09/07/2020   Elevated troponin 09/07/2020   Dizziness 09/07/2020   Numbness and tingling 09/07/2020   H/O medication noncompliance 09/07/2020   Medicare annual wellness visit, subsequent 12/05/2019   Nicotine dependence 12/05/2019   Chronic  kidney disease, stage 3 unspecified ([place]) 10/24/2019   Prediabetes 10/24/2019   Edema of foot 09/14/2017   Tubular adenoma of colon 09/14/2017   Long term (current) use of anticoagulants 11/14/2014   Sickle cell trait ([place]) 08/29/2012   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

## 2021-06-11 NOTE — Progress Notes (Signed)
Metrics: Intervention Frequency ACO  Documented Smoking Status Yearly  Screened one or more times in 24 months  Cessation Counseling or  Active cessation medication Past 24 months  Past 24 months   Guideline developer: UpToDate (See UpToDate for funding source) Date Released: 2014       Wellness Office Visit  Subjective:  Patient ID: Matthew Wise, male    DOB: [birth year]  Age: 60 y.o. MRN: [id number]  CC: Abscess posterior right thigh. HPI  This man comes in for an acute visit as he has noticed a boil in the posterior aspect of his right thigh for the last 2 weeks.  It actually started to drain yellow fluid and is no longer draining but he is concerned about it.  He denies any fever.  He is wheelchair-bound. 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 two simple tubular adenomas removed. patient was advised to go to [facility] for colonoscopy but he did not keep appt.   IR CT HEAD LTD  02/02/2021   IR PERCUTANEOUS ART THROMBECTOMY/INFUSION INTRACRANIAL INC DIAG ANGIO  02/02/2021   RADIOLOGY WITH ANESTHESIA N/A 02/02/2021   Procedure: IR WITH ANESTHESIA;  Surgeon:  Luanne Bras, MD;  Location: [facility];  Service: Radiology;  Laterality: N/A;   TEE WITHOUT CARDIOVERSION N/A 09/10/2020   Procedure: TRANSESOPHAGEAL ECHOCARDIOGRAM (TEE) WITH PROPOFOL;  Surgeon: Satira Sark, MD;  Location: [facility];  Service: Cardiovascular;  Laterality: N/A;     Family History  Problem Relation Age of Onset   COPD Father    Stroke Brother    Pulmonary embolism Sister    Throat cancer Brother    Arthritis Mother    Diabetes Mother    Stroke Mother    Hypertension Brother    Diabetes Brother    Arthritis Brother    Diabetes Sister    Colon cancer Neg Hx     Social History   Social History Narrative   Lives with sister   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   Right Handed    Drinks caffeine on occassion   Social History   Tobacco Use   Smoking status: Former    Packs/day: 1.00    Years: 30.00    Pack years: 30.00    Types: Cigarettes    Start date: 12/01/1983   Smokeless tobacco: Never  Substance Use Topics   Alcohol use: Not Currently    Alcohol/week: 3.0 standard drinks    Types: 3 Cans of beer per week    Comment: has not drank sice 11/27/20    Current Meds  Medication Sig   acetaminophen (TYLENOL) 325 MG tablet Take 2 tablets (  650 mg total) by mouth every 4 (four) hours as needed for mild pain (or temp > 37.5 C (99.5 F)).   amLODipine (NORVASC) 5 MG tablet Take 1 tablet by mouth daily.   amoxicillin-clavulanate (AUGMENTIN) 875-125 MG tablet Take 1 tablet by mouth 2 (two) times daily.   aspirin EC 81 MG EC tablet Take 1 tablet (81 mg total) by mouth daily. Swallow whole.   atorvastatin (LIPITOR) 40 MG tablet Take 1 tablet (40 mg total) by mouth daily.   dabigatran (PRADAXA) 150 MG CAPS capsule Take 1 capsule (150 mg total) by mouth every 12 (twelve) hours.   docusate sodium (COLACE) 100 MG capsule Take 1 capsule (100 mg total) by mouth daily.   levETIRAcetam (KEPPRA) 500 MG tablet Take 1 tablet (500 mg total) by mouth 2 (two) times  daily.   levothyroxine (SYNTHROID) 25 MCG tablet TAKE 1 TABLET BY MOUTH EVERY DAY   lisinopril (ZESTRIL) 10 MG tablet Take 1 tablet (10 mg total) by mouth daily.   pantoprazole (PROTONIX) 40 MG tablet Take 1 tablet (40 mg total) by mouth at bedtime.   senna-docusate (SENOKOT-S) 8.6-50 MG tablet Take 1 tablet by mouth at bedtime.   tamsulosin (FLOMAX) 0.4 MG CAPS capsule Take 1 capsule (0.4 mg total) by mouth daily after supper.   [DISCONTINUED] hydrALAZINE (APRESOLINE) 50 MG tablet Take 1 tablet (50 mg total) by mouth every 8 (eight) hours.   [DISCONTINUED] lisinopril (ZESTRIL) 20 MG tablet Take 1 tablet (20 mg total) by mouth daily.     [facility] Office Visit from 04/10/2021 in [facility]  PHQ-9 Total Score 0       Objective:   Today's Vitals: BP 96/64   Pulse (!) 42   Temp (!) 97.5 F (36.4 C) (Temporal)   Ht 6' (1.829 m)   Wt 233 lb (105.7 kg)   SpO2 93%   BMI 31.60 kg/m  Vitals with BMI 06/11/2021 06/09/2021 05/09/2021  Height 6\' 0"  6\' 0"  (No Data)  Weight 233 lbs 236 lbs (No Data)  BMI 78.29 32 -  Systolic 96 562 94  Diastolic 64 70 62  Pulse 42 68 85     Physical Exam  With the help of his wife, the patient was able to stand and I could see approximately 2 cm x 1 cm abscess area which still appears to have some fluid underneath the skin although it is not significantly erythematous. Also he was noted to be relatively hypotensive.     Assessment   1. Cellulitis of right lower extremity   2. Drug-induced hypotension       Tests ordered No orders of the defined types were placed in this encounter.    Plan: 1.  I think that since it is draining, eventually this will resolve but I will put him on some antibiotics just to make sure that he does not have any lingering infection. 2.  He is relatively hypotensive and we will reduce his lisinopril to 10 mg daily and I have sent a new prescription to reflect this. 3.  He has a follow-up appointment  with Judson Roch in about a month's time and we will need to address his hypertension at that time also.    Meds ordered this encounter  Medications   amoxicillin-clavulanate (AUGMENTIN) 875-125 MG tablet    Sig: Take 1 tablet by mouth 2 (two) times daily.    Dispense:  20 tablet    Refill:  0   lisinopril (ZESTRIL) 10 MG  tablet    Sig: Take 1 tablet (10 mg total) by mouth daily.    Dispense:  90 tablet    Refill:  1    Pluma Diniz Luther Parody, MD

## 2021-06-12 ENCOUNTER — Ambulatory Visit (HOSPITAL_COMMUNITY): Payer: Medicare Other

## 2021-06-12 ENCOUNTER — Encounter (HOSPITAL_COMMUNITY): Payer: Self-pay

## 2021-06-12 ENCOUNTER — Ambulatory Visit (HOSPITAL_COMMUNITY): Payer: Medicare Other | Admitting: Physical Therapy

## 2021-06-12 ENCOUNTER — Other Ambulatory Visit: Payer: Self-pay

## 2021-06-12 ENCOUNTER — Encounter (HOSPITAL_COMMUNITY): Payer: Self-pay | Admitting: Physical Therapy

## 2021-06-12 DIAGNOSIS — R350 Frequency of micturition: Secondary | ICD-10-CM | POA: Diagnosis not present

## 2021-06-12 DIAGNOSIS — I63511 Cerebral infarction due to unspecified occlusion or stenosis of right middle cerebral artery: Secondary | ICD-10-CM | POA: Diagnosis not present

## 2021-06-12 DIAGNOSIS — R278 Other lack of coordination: Secondary | ICD-10-CM

## 2021-06-12 DIAGNOSIS — M6281 Muscle weakness (generalized): Secondary | ICD-10-CM | POA: Diagnosis not present

## 2021-06-12 DIAGNOSIS — I952 Hypotension due to drugs: Secondary | ICD-10-CM | POA: Diagnosis not present

## 2021-06-12 DIAGNOSIS — M25642 Stiffness of left hand, not elsewhere classified: Secondary | ICD-10-CM

## 2021-06-12 DIAGNOSIS — R2689 Other abnormalities of gait and mobility: Secondary | ICD-10-CM

## 2021-06-12 DIAGNOSIS — L8990 Pressure ulcer of unspecified site, unspecified stage: Secondary | ICD-10-CM | POA: Diagnosis not present

## 2021-06-12 DIAGNOSIS — M25622 Stiffness of left elbow, not elsewhere classified: Secondary | ICD-10-CM | POA: Diagnosis not present

## 2021-06-12 DIAGNOSIS — R29898 Other symptoms and signs involving the musculoskeletal system: Secondary | ICD-10-CM | POA: Diagnosis not present

## 2021-06-12 DIAGNOSIS — I69391 Dysphagia following cerebral infarction: Secondary | ICD-10-CM | POA: Diagnosis not present

## 2021-06-12 DIAGNOSIS — R262 Difficulty in walking, not elsewhere classified: Secondary | ICD-10-CM

## 2021-06-12 NOTE — Therapy (Signed)
[facility] [place], [place], [address] Phone: [phone]   Fax:  [phone]  Occupational Therapy Treatment  Patient Details  Name: Matthew Wise MRN: [id number] Date of Birth: 14-Jan-1961 Referring Provider (OT): Alysia Penna, MD   Encounter Date: 06/12/2021   OT End of Session - 06/12/21 8786     Visit Number 8    Number of Visits 24    Date for OT Re-Evaluation 08/06/21   mini reassess: 07/08/21   Authorization Type [employer] medicare    Authorization Time Period 11/30/20-end of year. $30 copay. 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 two simple tubular adenomas removed. patient was advised to go to [facility] for colonoscopy but he did not keep appt.   IR CT HEAD LTD  02/02/2021   IR PERCUTANEOUS ART THROMBECTOMY/INFUSION INTRACRANIAL INC DIAG  ANGIO  02/02/2021   RADIOLOGY WITH ANESTHESIA N/A 02/02/2021   Procedure: IR WITH ANESTHESIA;  Surgeon: Luanne Bras, MD;  Location: [facility];  Service: Radiology;  Laterality: N/A;   TEE WITHOUT CARDIOVERSION N/A 09/10/2020   Procedure: TRANSESOPHAGEAL ECHOCARDIOGRAM (TEE) WITH PROPOFOL;  Surgeon: Satira Sark, MD;  Location: [facility];  Service: Cardiovascular;  Laterality: N/A;    There were no vitals filed for this visit.   Subjective Assessment - 06/12/21 1613     Subjective  S: Agrees that his Sister jumps in and helps too quickly instead of letting him try to help himself during dressing.    Currently in Pain? No/denies                [facility] OT Assessment - 06/12/21 1613       Assessment   Medical Diagnosis S/P right MCA with left hemiparesis      Precautions   Precautions Fall    Precaution Comments left side hemiparesis, left side visual field deficits                      OT Treatments/Exercises (OP) - 06/12/21 1614       Transfers   Transfers Sit to Stand;Stand to Sit    Sit to Stand 4: Min guard    Stand to Sit 4: Min guard;With armrests  ADLs   UB Dressing Completed UB dressing using 3XL t-shirt over patient's own t-shirt. seated in wheelchair with leg rest off. hemidressing technique used at Supervision level.    LB Dressing LB dressing completed seated in wheelchair involving donning and doffing socks and shoes bilaterally. Elastic shoelaces placed in both shoes. Hemidressing technique used.                    OT Education - 06/12/21 1145     Education Details provided contact information for Juliann Pulse from [employer] to receive a tub bench.    Person(s) Educated Patient    Methods Explanation;Handout    Comprehension Verbalized understanding              OT Short Term Goals - 05/16/21 1415       OT SHORT TERM GOAL #1   Title Patient will be educated and independent with HEP completion in order to  faciliate his progress in therapy and begin to use his LUE as a gross assist for 25% or more of daily tasks.    Time 6    Period Weeks    Status On-going    Target Date 06/25/21      OT SHORT TERM GOAL #2   Title Patient will demonstrate a 25% increase in A/ROM of the LUE (shoulder, elbow, wrist) as able while using his LUE as a gross assist when completing self care tasks such as dressing.    Time 6    Period Weeks    Status On-going      OT SHORT TERM GOAL #3   Title Patient will demonstrate increased fine motor coordination in his left hand by completing the 9 hole peg test with peg set-up if needed.    Time 6    Period Weeks    Status On-going      OT SHORT TERM GOAL #4   Title Patient will increase is left hand strength to 10# and his pinch strength to 5# in order to begin to use his left hand to stabilize objects when completing self feeding.    Time 6    Period Weeks    Status On-going      OT SHORT TERM GOAL #5   Title Patient will completing bathing and dressing with Mod assist where needed while utilizing DME, Adaptive equipment, and/or compensatory techniques with improved sitting and standing balance.    Time 6    Period Weeks    Status On-going               OT Long Term Goals - 05/16/21 1415       OT LONG TERM GOAL #1   Title Patient will demonstrate improved LUE A/ROM by 50% or more while utilizing his LUE as his non dominant extremity to provide an active assist to complete tasks that require use of bilateral UE's.    Time 12    Period Weeks    Status On-going      OT LONG TERM GOAL #2   Title Patient will increase his LUE strength (shoulder, elbow, wrist) to 4/5 or better while demonstrating improved ability to complete bathing and dressing tasks.    Time 12    Period Weeks    Status On-going      OT LONG TERM GOAL #3   Title Patient will increase his fine motor coordination in his left hand by completing the 9 hole peg test in the standardized  fashion under 1'.    Time 12    Period Weeks    Status On-going      OT LONG TERM GOAL #4   Title Patient will demonstrate improved left hand grip strength to 15# and pinch strength to 8# in order to manipulate and hold onto items in his left hand without dropping.    Time 12    Period Weeks    Status On-going      OT LONG TERM GOAL #5   Title Patient will completing bathing and dressing with min assist where needed while utilizing DME, Adaptive equipment, and/or compensatory techniques with improved sitting and standing balance.    Status On-going                   Plan - 06/12/21 1617     Clinical Impression Statement A: Provided patient with contact information for [employer] to obtain a shower bench. Session focused on ADL re-training related to UB dressing and LB dressing. Due to time constraint did not complete donning/doffing pants using reacher if needed. When attempting to donn t-shirt, patient did place left arm in wrong sleeve and OT corrected error. Verbal and visual cues were provided as needed during UB dressing for success with task. Provided education on how to donn socks with right hand. with new elastic shoelaces, patient had more difficulty managing the tongue of the shoe and needed additional cueing on how to manage and prevent it from rolling down into shoe. Encouraged patient to talk to sister and ask her not to step in and assist too quickly and instead provide more verbal direction and guidance in order for him to complete task himself.    Body Structure / Function / Physical Skills ADL;Decreased knowledge of use of DME;Strength;Tone;GMC;Balance;UE functional use;Vision;ROM;IADL;Sensation;Coordination;FMC    Plan P: Follow up on tub bench. Complete LB dressing using hemi dressing technique, reacher (if needed), while seated in wheelchair. Provide any handouts to take home if needed.    Consulted and Agree with Plan of Care Patient              Patient will benefit from skilled therapeutic intervention in order to improve the following deficits and impairments:   Body Structure / Function / Physical Skills: ADL, Decreased knowledge of use of DME, Strength, Tone, GMC, Balance, UE functional use, Vision, ROM, IADL, Sensation, Coordination, [facility]       Visit Diagnosis: Stiffness of left elbow, not elsewhere classified  Stiffness of left hand, not elsewhere classified  Other lack of coordination  Other symptoms and signs involving the musculoskeletal system    Problem List Patient Active Problem List   Diagnosis Date Noted   Urinary frequency    Dysphagia, post-stroke    Spastic hemiplegia affecting nondominant side (HCC)    Drug-induced hypotension    Pressure injury of skin 02/14/2021   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   Dyslipidemia    Hemiparesis affecting left side as late effect of stroke ([place])    Middle cerebral artery embolism, right 02/03/2021   Status post surgery 02/02/2021   Stroke (cerebrum) ([place]) 02/02/2021   Hypoglycemia 01/25/2021   Thyroid nodule 01/25/2021   Hypothyroidism 01/25/2021   Near syncope 01/21/2021   H/O adenomatous polyp of colon 12/30/2020   Acute ischemic stroke ([place]) 11/28/2020   Essential hypertension 11/28/2020   Mixed hyperlipidemia 11/28/2020   Acute CVA (cerebrovascular accident) ([place]) 09/09/2020   Transient ischemic attack 09/07/2020   History  of hypertension 09/07/2020   Elevated troponin 09/07/2020   Dizziness 09/07/2020   Numbness and tingling 09/07/2020   H/O medication noncompliance 09/07/2020   Medicare annual wellness visit, subsequent 12/05/2019   Nicotine dependence 12/05/2019   Chronic kidney disease, stage 3 unspecified ([place]) 10/24/2019   Prediabetes 10/24/2019   Edema of foot 09/14/2017   Tubular adenoma of colon 09/14/2017   Long term (current) use of anticoagulants 11/14/2014   Sickle cell trait ([place]) 08/29/2012   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 [place], [place], [address] Phone: [phone]   Fax:  [phone]  Name: Matthew Wise MRN: [id number] Date of Birth: 1961/10/29

## 2021-06-12 NOTE — Patient Instructions (Signed)
Juliann Pulse from [employer] is located on [address] Call her cell: [phone] to set up an appointment to meet her there for a shower bench She is there on Tuesday and other days by appointment.

## 2021-06-12 NOTE — Therapy (Signed)
[place], [place], [address] Phone: [phone]   Fax:  [phone]  Physical Therapy Treatment  Patient Details  Name: Matthew Wise MRN: [id number] Date of Birth: 15-Mar-1961 Referring Provider (PT): Alysia Penna MD   Encounter Date: 06/12/2021   PT End of Session - 06/12/21 1123     Visit Number 6    Number of Visits 16    Date for PT Re-Evaluation 07/17/21    Authorization Type [employer] MEdicare    Progress Note Due on Visit 10    PT Start Time 1130    PT Stop Time 1209    PT Time Calculation (min) 39 min    Equipment Utilized During Treatment Gait belt   WC near   Activity Tolerance Patient tolerated treatment well    Behavior During Therapy [facility] for tasks assessed/performed             Past Medical History:  Diagnosis Date   Acute CVA (cerebrovascular accident) ([place]) 09/09/2020   Chronic anticoagulation 11/14/2014   CKD (chronic kidney disease) stage 3, GFR 30-59 ml/min ([place]) 10/24/2019   CKD (chronic kidney disease) stage 3, GFR 30-59 ml/min ([place]) 10/24/2019   Clotting disorder ([place])    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 two simple tubular adenomas removed. patient was advised to go to [facility] for colonoscopy but he did not keep appt.   IR CT HEAD LTD  02/02/2021   IR PERCUTANEOUS ART THROMBECTOMY/INFUSION INTRACRANIAL INC DIAG ANGIO  02/02/2021   RADIOLOGY WITH ANESTHESIA  N/A 02/02/2021   Procedure: IR WITH ANESTHESIA;  Surgeon: Luanne Bras, MD;  Location: [facility];  Service: Radiology;  Laterality: N/A;   TEE WITHOUT CARDIOVERSION N/A 09/10/2020   Procedure: TRANSESOPHAGEAL ECHOCARDIOGRAM (TEE) WITH PROPOFOL;  Surgeon: Satira Sark, MD;  Location: [facility];  Service: Cardiovascular;  Laterality: N/A;    There were no vitals filed for this visit.       [facility] PT Assessment - 06/12/21 0001       Assessment   Medical Diagnosis S/P right MCA with left hemiparesis    Referring Provider (PT) Alysia Penna MD                           [facility] Adult PT Treatment/Exercise - 06/12/21 0001       Bed Mobility   Bed Mobility Rolling Right;Rolling Left;Left Sidelying to Sit;Sit to Supine    Rolling Right Minimal Assistance - Patient > 75%    Rolling Left Minimal Assistance - Patient > 75%    Left Sidelying to Sit Minimal Assistance - Patient >75%    Sit to Supine Minimal Assistance - Patient > 75%   with L LE     Transfers   Transfers Stand Pivot Transfers    Sit to Stand 4: Min guard    Stand Pivot Transfers 3: Mod assist  with RW     Knee/Hip Exercises: Supine   Short Arc Quad Sets 15 reps;Left   on ball   Bridges 1 set;10 reps    Bridges Limitations assit for foot placemnet    Other Supine Knee/Hip Exercises hip IR  max 25 seconds - loses focus x2; DF/PF PT assist tactile cues and 1/2 foam roller    Other Supine Knee/Hip Exercises hip add with ball holds to max x10 attempts - orange ball      Knee/Hip Exercises: Sidelying   Other Sidelying Knee/Hip Exercises PT assist hip extension isometric tactile and verbal cues - x25 5" holds L - trialed powder board but this did not work with set upl Hip IR and ER isometrics with PT assist - x15 5" holds Each Left                      PT Short Term Goals - 05/22/21 1641       PT SHORT TERM GOAL #1   Title Patient will be independent with initial HEP and self-management  strategies to improve functional outcomes    Time 4    Period Weeks    Status New    Target Date 06/19/21      PT SHORT TERM GOAL #2   Title Patient will be Min A with transfers and bed mobility    Time 4    Period Weeks    Status New    Target Date 06/19/21               PT Long Term Goals - 05/22/21 1642       PT LONG TERM GOAL #1   Title Patient will be Mod I with bed mobility and transfers    Time 8    Period Weeks    Status New    Target Date 07/17/21      PT LONG TERM GOAL #2   Title Patient will be able to ambulate >100 feet with LRAD for improved household mobility    Time 8    Period Weeks    Status New    Target Date 07/17/21      PT LONG TERM GOAL #3   Title Patient will have at least 4/5 MMT throughout LLE (except ankle DF) for improved functional mobility and stair/ step navigation.    Time 8    Period Weeks    Status New    Target Date 07/17/21                   Plan - 06/12/21 1334     Clinical Impression Statement Session focused on muscle activation with verbal and tactile cues for muscle facilitation. AAROM performed and isometrics of the left hip in side lying. Improved glute contraction noted with practice and cues. Improved transfer to and from bed and wheelchair with use of hemi walker. Able to maintain hook lying position with ball between knees and no support at leg (mild support at foot) for up to 12 seconds at a time until patient becomes fatigued. Will continue with muscle activation and functional tasks as tolerated.    Examination-Activity Limitations Caring for Others;Transfers;Locomotion Level;Toileting;Stand    Examination-Participation Restrictions Yard Work;Cleaning;Laundry;Community Activity    Stability/Clinical Decision Making Evolving/Moderate complexity    Rehab Potential Fair    PT Frequency 2x / week    PT Duration 8 weeks    PT Treatment/Interventions ADLs/Self Care Home Management;Aquatic  Therapy;Biofeedback;Cryotherapy;Fluidtherapy;Contrast Bath;Electrical Stimulation;Therapeutic exercise;Orthotic  Fit/Training;Patient/family education;Manual lymph drainage;Manual techniques;Dry needling;Energy conservation;Spinal Manipulations;Joint Manipulations;Splinting;Taping;Compression bandaging;Vasopneumatic Device;Therapeutic activities;Parrafin;Ultrasound;Traction;Functional mobility training;Neuromuscular re-education;Stair training;Gait training;Moist Heat;Iontophoresis 4mg /ml Dexamethasone;DME Instruction;Balance training;Passive range of motion;Scar mobilization;Wheelchair mobility training;Visual/perceptual remediation/compensation    PT Next Visit Plan Progress functional mobility as tolerated. Seated and mat strengthening, encourage WB through LT side. Standing balance> gait    PT Home Exercise Plan Eval: marches, LAQs, heel/toe raise; 7/1:  bridge and sidelying clam 7/12 heel slide, supine hip abduction    Consulted and Agree with Plan of Care Patient             Patient will benefit from skilled therapeutic intervention in order to improve the following deficits and impairments:  Abnormal gait, Improper body mechanics, Impaired sensation, Decreased balance, Difficulty walking, Decreased activity tolerance, Decreased range of motion, Decreased strength, Hypomobility, Impaired tone, Decreased mobility, Decreased coordination  Visit Diagnosis: Muscle weakness (generalized)  Difficulty in walking, not elsewhere classified  Other abnormalities of gait and mobility     Problem List Patient Active Problem List   Diagnosis Date Noted   Urinary frequency    Dysphagia, post-stroke    Spastic hemiplegia affecting nondominant side (HCC)    Drug-induced hypotension    Pressure injury of skin 02/14/2021   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   Dyslipidemia    Hemiparesis affecting left side as late effect of stroke ([place])    Middle cerebral artery embolism, right  02/03/2021   Status post surgery 02/02/2021   Stroke (cerebrum) ([place]) 02/02/2021   Hypoglycemia 01/25/2021   Thyroid nodule 01/25/2021   Hypothyroidism 01/25/2021   Near syncope 01/21/2021   H/O adenomatous polyp of colon 12/30/2020   Acute ischemic stroke ([place]) 11/28/2020   Essential hypertension 11/28/2020   Mixed hyperlipidemia 11/28/2020   Acute CVA (cerebrovascular accident) ([place]) 09/09/2020   Transient ischemic attack 09/07/2020   History of hypertension 09/07/2020   Elevated troponin 09/07/2020   Dizziness 09/07/2020   Numbness and tingling 09/07/2020   H/O medication noncompliance 09/07/2020   Medicare annual wellness visit, subsequent 12/05/2019   Nicotine dependence 12/05/2019   Chronic kidney disease, stage 3 unspecified ([place]) 10/24/2019   Prediabetes 10/24/2019   Edema of foot 09/14/2017   Tubular adenoma of colon 09/14/2017   Long term (current) use of anticoagulants 11/14/2014   Sickle cell trait ([place]) 08/29/2012   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 [place], [place], [address] Phone: [phone]   Fax:  [phone]  Name: Matthew Wise MRN: [id number] Date of Birth: 1961/07/30

## 2021-06-16 ENCOUNTER — Other Ambulatory Visit: Payer: Self-pay

## 2021-06-16 ENCOUNTER — Encounter (HOSPITAL_COMMUNITY): Payer: Medicare Other

## 2021-06-16 ENCOUNTER — Encounter (HOSPITAL_COMMUNITY): Payer: Self-pay | Admitting: Physical Therapy

## 2021-06-16 ENCOUNTER — Ambulatory Visit (HOSPITAL_COMMUNITY): Payer: Medicare Other | Admitting: Physical Therapy

## 2021-06-16 DIAGNOSIS — M6281 Muscle weakness (generalized): Secondary | ICD-10-CM | POA: Diagnosis not present

## 2021-06-16 DIAGNOSIS — R29898 Other symptoms and signs involving the musculoskeletal system: Secondary | ICD-10-CM | POA: Diagnosis not present

## 2021-06-16 DIAGNOSIS — R2689 Other abnormalities of gait and mobility: Secondary | ICD-10-CM

## 2021-06-16 DIAGNOSIS — R262 Difficulty in walking, not elsewhere classified: Secondary | ICD-10-CM | POA: Diagnosis not present

## 2021-06-16 DIAGNOSIS — R278 Other lack of coordination: Secondary | ICD-10-CM | POA: Diagnosis not present

## 2021-06-16 DIAGNOSIS — M25622 Stiffness of left elbow, not elsewhere classified: Secondary | ICD-10-CM | POA: Diagnosis not present

## 2021-06-16 DIAGNOSIS — M25642 Stiffness of left hand, not elsewhere classified: Secondary | ICD-10-CM | POA: Diagnosis not present

## 2021-06-16 NOTE — Therapy (Signed)
[facility] [place], [place], [address] Phone: [phone]   Fax:  [phone]  Physical Therapy Treatment  Patient Details  Name: Matthew Wise MRN: [id number] Date of Birth: December 14, 1960 Referring Provider (PT): Alysia Penna MD   Encounter Date: 06/16/2021   PT End of Session - 06/16/21 1129     Visit Number 7    Number of Visits 16    Date for PT Re-Evaluation 07/17/21    Authorization Type [employer]    Progress Note Due on Visit 10    PT Start Time 1130    PT Stop Time 1208    PT Time Calculation (min) 38 min    Equipment Utilized During Treatment Gait belt   WC near   Activity Tolerance Patient tolerated treatment well    Behavior During Therapy [facility] for tasks assessed/performed             Past Medical History:  Diagnosis Date   Acute CVA (cerebrovascular accident) ([place]) 09/09/2020   Chronic anticoagulation 11/14/2014   CKD (chronic kidney disease) stage 3, GFR 30-59 ml/min ([place]) 10/24/2019   CKD (chronic kidney disease) stage 3, GFR 30-59 ml/min ([place]) 10/24/2019   Clotting disorder ([place])    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 two simple tubular adenomas removed. patient was advised to go to [facility] for colonoscopy but he did not keep appt.   IR CT HEAD LTD  02/02/2021   IR PERCUTANEOUS ART THROMBECTOMY/INFUSION INTRACRANIAL INC DIAG ANGIO  02/02/2021   RADIOLOGY WITH ANESTHESIA  N/A 02/02/2021   Procedure: IR WITH ANESTHESIA;  Surgeon: Luanne Bras, MD;  Location: [place];  Service: Radiology;  Laterality: N/A;   TEE WITHOUT CARDIOVERSION N/A 09/10/2020   Procedure: TRANSESOPHAGEAL ECHOCARDIOGRAM (TEE) WITH PROPOFOL;  Surgeon: Satira Sark, MD;  Location: [facility];  Service: Cardiovascular;  Laterality: N/A;    There were no vitals filed for this visit.   Subjective Assessment - 06/16/21 1130     Subjective Patient states nothing new, no falls. His home exercises have been going alrlight.    Pertinent History CVA 02/02/21    Patient Stated Goals Be able to get up and go without walker    Currently in Pain? No/denies                               [facility] Adult PT Treatment/Exercise - 06/16/21 0001       Knee/Hip Exercises: Standing   Hip Flexion Both;10 reps    Hip Abduction Both;10 reps    Other Standing Knee Exercises lateral weight shifts x 20 bilateral with cueing to reduce UE support      Knee/Hip Exercises: Supine   Short Arc Quad Sets 15 reps;Left    Short Arc Quad Sets Limitations with orange ball    Bridges 10 reps;2 sets    Bridges Limitations assit for  foot placement and block for hip going into abduction    Other Supine Knee/Hip Exercises hip abduciton 10 x 10 second holds bilateral with belt    Other Supine Knee/Hip Exercises hip add with ball holds to max x10 attempts  with 5 second holds- orange ball                      PT Short Term Goals - 05/22/21 1641       PT SHORT TERM GOAL #1   Title Patient will be independent with initial HEP and self-management strategies to improve functional outcomes    Time 4    Period Weeks    Status New    Target Date 06/19/21      PT SHORT TERM GOAL #2   Title Patient will be Min A with transfers and bed mobility    Time 4    Period Weeks    Status New    Target Date 06/19/21               PT Long Term Goals - 05/22/21 1642       PT LONG TERM GOAL  #1   Title Patient will be Mod I with bed mobility and transfers    Time 8    Period Weeks    Status New    Target Date 07/17/21      PT LONG TERM GOAL #2   Title Patient will be able to ambulate >100 feet with LRAD for improved household mobility    Time 8    Period Weeks    Status New    Target Date 07/17/21      PT LONG TERM GOAL #3   Title Patient will have at least 4/5 MMT throughout LLE (except ankle DF) for improved functional mobility and stair/ step navigation.    Time 8    Period Weeks    Status New    Target Date 07/17/21                   Plan - 06/16/21 1129     Clinical Impression Statement Patient with difficulty in standing due to quick LLE fatigue and impaired balance. Patient requires heavy UE support from RUE to complete standing exercises. Patient requires min guard to min/mod assist will all exercises performed today and transfers. Completed mat exercises and patient requires assist with each exercise due to impaired strength and motor control.  Patient transfers well from mat to chair with hemiwalker. Patient will continue to benefit from skilled physical therapy in order to reduce impairment and improve function.    Examination-Activity Limitations Caring for Others;Transfers;Locomotion Level;Toileting;Stand    Examination-Participation Restrictions Yard Work;Cleaning;Laundry;Community Activity    Stability/Clinical Decision Making Evolving/Moderate complexity    Rehab Potential Fair    PT Frequency 2x / week    PT Duration 8 weeks    PT Treatment/Interventions ADLs/Self Care Home Management;Aquatic Therapy;Biofeedback;Cryotherapy;Fluidtherapy;Contrast Bath;Electrical Stimulation;Therapeutic exercise;Orthotic Fit/Training;Patient/family education;Manual lymph drainage;Manual techniques;Dry needling;Energy conservation;Spinal Manipulations;Joint Manipulations;Splinting;Taping;Compression bandaging;Vasopneumatic Device;Therapeutic  activities;Parrafin;Ultrasound;Traction;Functional mobility training;Neuromuscular re-education;Stair training;Gait training;Moist Heat;Iontophoresis 4mg /ml Dexamethasone;DME Instruction;Balance training;Passive range of motion;Scar mobilization;Wheelchair mobility training;Visual/perceptual remediation/compensation    PT Next Visit Plan Progress functional mobility as tolerated. Seated and mat strengthening, encourage WB through LT side. Standing balance> gait    PT Home Exercise Plan Eval: marches, LAQs, heel/toe raise; 7/1:  bridge and sidelying clam 7/12 heel slide, supine hip abduction    Consulted and Agree with Plan of Care Patient  Patient will benefit from skilled therapeutic intervention in order to improve the following deficits and impairments:  Abnormal gait, Improper body mechanics, Impaired sensation, Decreased balance, Difficulty walking, Decreased activity tolerance, Decreased range of motion, Decreased strength, Hypomobility, Impaired tone, Decreased mobility, Decreased coordination  Visit Diagnosis: Muscle weakness (generalized)  Difficulty in walking, not elsewhere classified  Other abnormalities of gait and mobility     Problem List Patient Active Problem List   Diagnosis Date Noted   Urinary frequency    Dysphagia, post-stroke    Spastic hemiplegia affecting nondominant side (HCC)    Drug-induced hypotension    Pressure injury of skin 02/14/2021   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   Dyslipidemia    Hemiparesis affecting left side as late effect of stroke ([place])    Middle cerebral artery embolism, right 02/03/2021   Status post surgery 02/02/2021   Stroke (cerebrum) ([place]) 02/02/2021   Hypoglycemia 01/25/2021   Thyroid nodule 01/25/2021   Hypothyroidism 01/25/2021   Near syncope 01/21/2021   H/O adenomatous polyp of colon 12/30/2020   Acute ischemic stroke ([place]) 11/28/2020   Essential hypertension 11/28/2020   Mixed  hyperlipidemia 11/28/2020   Acute CVA (cerebrovascular accident) ([place]) 09/09/2020   Transient ischemic attack 09/07/2020   History of hypertension 09/07/2020   Elevated troponin 09/07/2020   Dizziness 09/07/2020   Numbness and tingling 09/07/2020   H/O medication noncompliance 09/07/2020   Medicare annual wellness visit, subsequent 12/05/2019   Nicotine dependence 12/05/2019   Chronic kidney disease, stage 3 unspecified ([place]) 10/24/2019   Prediabetes 10/24/2019   Edema of foot 09/14/2017   Tubular adenoma of colon 09/14/2017   Long term (current) use of anticoagulants 11/14/2014   Sickle cell trait ([place]) 08/29/2012   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 [place], [place], [address] Phone: [phone]   Fax:  [phone]  Name: WYNDELL CARDIFF MRN: [id number] Date of Birth: 09-19-1961

## 2021-06-18 ENCOUNTER — Ambulatory Visit (HOSPITAL_COMMUNITY): Payer: Medicare Other

## 2021-06-18 ENCOUNTER — Encounter (HOSPITAL_COMMUNITY): Payer: Self-pay | Admitting: Physical Therapy

## 2021-06-18 ENCOUNTER — Other Ambulatory Visit: Payer: Self-pay

## 2021-06-18 ENCOUNTER — Ambulatory Visit (HOSPITAL_COMMUNITY): Payer: Medicare Other | Admitting: Physical Therapy

## 2021-06-18 DIAGNOSIS — R29898 Other symptoms and signs involving the musculoskeletal system: Secondary | ICD-10-CM

## 2021-06-18 DIAGNOSIS — R278 Other lack of coordination: Secondary | ICD-10-CM | POA: Diagnosis not present

## 2021-06-18 DIAGNOSIS — R262 Difficulty in walking, not elsewhere classified: Secondary | ICD-10-CM

## 2021-06-18 DIAGNOSIS — M25622 Stiffness of left elbow, not elsewhere classified: Secondary | ICD-10-CM

## 2021-06-18 DIAGNOSIS — M25642 Stiffness of left hand, not elsewhere classified: Secondary | ICD-10-CM

## 2021-06-18 DIAGNOSIS — R2689 Other abnormalities of gait and mobility: Secondary | ICD-10-CM | POA: Diagnosis not present

## 2021-06-18 DIAGNOSIS — M6281 Muscle weakness (generalized): Secondary | ICD-10-CM | POA: Diagnosis not present

## 2021-06-18 NOTE — Therapy (Signed)
[facility] [address] [place], [place], [address] Phone: [phone]   Fax:  [phone]  Physical Therapy Treatment  Patient Details  Name: Matthew Wise MRN: [id number] Date of Birth: 27-Jan-1961 Referring Provider (PT): Alysia Penna MD   Encounter Date: 06/18/2021   PT End of Session - 06/18/21 1405     Visit Number 8    Number of Visits 16    Date for PT Re-Evaluation 07/17/21    Authorization Type [employer]    Progress Note Due on Visit 10    PT Start Time 1355   late arrival   PT Stop Time 1430    PT Time Calculation (min) 35 min    Equipment Utilized During Treatment Gait belt   WC near   Activity Tolerance Patient tolerated treatment well    Behavior During Therapy [facility] for tasks assessed/performed             Past Medical History:  Diagnosis Date   Acute CVA (cerebrovascular accident) ([place]) 09/09/2020   Chronic anticoagulation 11/14/2014   CKD (chronic kidney disease) stage 3, GFR 30-59 ml/min ([place]) 10/24/2019   CKD (chronic kidney disease) stage 3, GFR 30-59 ml/min ([place]) 10/24/2019   Clotting disorder ([place])    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 two simple tubular adenomas removed. patient was advised to go to [facility] for colonoscopy but he did not keep appt.   IR CT HEAD LTD  02/02/2021   IR PERCUTANEOUS ART THROMBECTOMY/INFUSION INTRACRANIAL INC DIAG ANGIO  02/02/2021   RADIOLOGY  WITH ANESTHESIA N/A 02/02/2021   Procedure: IR WITH ANESTHESIA;  Surgeon: Luanne Bras, MD;  Location: [facility];  Service: Radiology;  Laterality: N/A;   TEE WITHOUT CARDIOVERSION N/A 09/10/2020   Procedure: TRANSESOPHAGEAL ECHOCARDIOGRAM (TEE) WITH PROPOFOL;  Surgeon: Satira Sark, MD;  Location: [facility];  Service: Cardiovascular;  Laterality: N/A;    There were no vitals filed for this visit.   Subjective Assessment - 06/18/21 1405     Subjective No new issues. No pain currently    Pertinent History CVA 02/02/21    Patient Stated Goals Be able to get up and go without walker    Currently in Pain? No/denies                               [facility] Adult PT Treatment/Exercise - 06/18/21 0001       Knee/Hip Exercises: Aerobic   Nustep 3 min Lv 1 for mobility and sequencing      Knee/Hip Exercises: Seated   Sit to Sand 1 set;5 reps   with belt at knees     Knee/Hip Exercises: Supine   Short Arc Quad Sets Left;2 sets;10 reps   assist   Bridges 10 reps;2 sets    Bridges Limitations with belt to control for abduction    Other Supine Knee/Hip Exercises --  PT Short Term Goals - 05/22/21 1641       PT SHORT TERM GOAL #1   Title Patient will be independent with initial HEP and self-management strategies to improve functional outcomes    Time 4    Period Weeks    Status New    Target Date 06/19/21      PT SHORT TERM GOAL #2   Title Patient will be Min A with transfers and bed mobility    Time 4    Period Weeks    Status New    Target Date 06/19/21               PT Long Term Goals - 05/22/21 1642       PT LONG TERM GOAL #1   Title Patient will be Mod I with bed mobility and transfers    Time 8    Period Weeks    Status New    Target Date 07/17/21      PT LONG TERM GOAL #2   Title Patient will be able to ambulate >100 feet with LRAD for improved household mobility    Time 8    Period Weeks    Status New     Target Date 07/17/21      PT LONG TERM GOAL #3   Title Patient will have at least 4/5 MMT throughout LLE (except ankle DF) for improved functional mobility and stair/ step navigation.    Time 8    Period Weeks    Status New    Target Date 07/17/21                   Plan - 06/18/21 1431     Clinical Impression Statement Patient continues to be limited by decreased LLE strength and coordination. Added nustep for limb sequencing. Added belt for bridging to reduce knee abduction. Patient requires frequent cues to engage muscles and control for LT hip abduction during activity. Will continue to progress as tolerated.    Examination-Activity Limitations Caring for Others;Transfers;Locomotion Level;Toileting;Stand    Examination-Participation Restrictions Yard Work;Cleaning;Laundry;Community Activity    Stability/Clinical Decision Making Evolving/Moderate complexity    Rehab Potential Fair    PT Frequency 2x / week    PT Duration 8 weeks    PT Treatment/Interventions ADLs/Self Care Home Management;Aquatic Therapy;Biofeedback;Cryotherapy;Fluidtherapy;Contrast Bath;Electrical Stimulation;Therapeutic exercise;Orthotic Fit/Training;Patient/family education;Manual lymph drainage;Manual techniques;Dry needling;Energy conservation;Spinal Manipulations;Joint Manipulations;Splinting;Taping;Compression bandaging;Vasopneumatic Device;Therapeutic activities;Parrafin;Ultrasound;Traction;Functional mobility training;Neuromuscular re-education;Stair training;Gait training;Moist Heat;Iontophoresis 4mg /ml Dexamethasone;DME Instruction;Balance training;Passive range of motion;Scar mobilization;Wheelchair mobility training;Visual/perceptual remediation/compensation    PT Next Visit Plan Progress functional mobility as tolerated. Seated and mat strengthening, encourage WB through LT side. Standing balance> gait    PT Home Exercise Plan Eval: marches, LAQs, heel/toe raise; 7/1:  bridge and sidelying clam 7/12  heel slide, supine hip abduction    Consulted and Agree with Plan of Care Patient             Patient will benefit from skilled therapeutic intervention in order to improve the following deficits and impairments:  Abnormal gait, Improper body mechanics, Impaired sensation, Decreased balance, Difficulty walking, Decreased activity tolerance, Decreased range of motion, Decreased strength, Hypomobility, Impaired tone, Decreased mobility, Decreased coordination  Visit Diagnosis: Muscle weakness (generalized)  Difficulty in walking, not elsewhere classified  Other abnormalities of gait and mobility     Problem List Patient Active Problem List   Diagnosis Date Noted   Urinary frequency    Dysphagia, post-stroke    Spastic hemiplegia affecting nondominant side (HCC)  Drug-induced hypotension    Pressure injury of skin 02/14/2021   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   Dyslipidemia    Hemiparesis affecting left side as late effect of stroke [facility])    Middle cerebral artery embolism, right 02/03/2021   Status post surgery 02/02/2021   Stroke (cerebrum) ([place]) 02/02/2021   Hypoglycemia 01/25/2021   Thyroid nodule 01/25/2021   Hypothyroidism 01/25/2021   Near syncope 01/21/2021   H/O adenomatous polyp of colon 12/30/2020   Acute ischemic stroke ([place]) 11/28/2020   Essential hypertension 11/28/2020   Mixed hyperlipidemia 11/28/2020   Acute CVA (cerebrovascular accident) ([place]) 09/09/2020   Transient ischemic attack 09/07/2020   History of hypertension 09/07/2020   Elevated troponin 09/07/2020   Dizziness 09/07/2020   Numbness and tingling 09/07/2020   H/O medication noncompliance 09/07/2020   Medicare annual wellness visit, subsequent 12/05/2019   Nicotine dependence 12/05/2019   Chronic kidney disease, stage 3 unspecified ([place]) 10/24/2019   Prediabetes 10/24/2019   Edema of foot 09/14/2017   Tubular adenoma of colon 09/14/2017   Long term (current) use of  anticoagulants 11/14/2014   Sickle cell trait ([place]) 08/29/2012   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  [phone] [place] [address] [place], [place], [address] Phone: [phone]   Fax:  [phone]  Name: Matthew Wise MRN: [id number] Date of Birth: 1961/06/17

## 2021-06-19 ENCOUNTER — Encounter (HOSPITAL_COMMUNITY): Payer: Self-pay

## 2021-06-19 NOTE — Therapy (Signed)
[place], [place], [address] Phone: [phone]   Fax:  [phone]  Occupational Therapy Treatment  Patient Details  Name: Matthew Wise MRN: [id number] Date of Birth: 05/01/1961 Referring Provider (OT): Alysia Penna, MD   Encounter Date: 06/18/2021   OT End of Session - 06/19/21 1510     Visit Number 9    Number of Visits 24    Date for OT Re-Evaluation 08/06/21   mini reassess: 07/08/21   Authorization Type UHC medicare    Authorization Time Period 11/30/20-end of year. $30 copay. 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 two simple tubular adenomas removed. patient was advised to go to [facility] for colonoscopy but he did not keep appt.   IR CT HEAD LTD  02/02/2021   IR PERCUTANEOUS ART THROMBECTOMY/INFUSION INTRACRANIAL INC DIAG  ANGIO  02/02/2021   RADIOLOGY WITH ANESTHESIA N/A 02/02/2021   Procedure: IR WITH ANESTHESIA;  Surgeon: Luanne Bras, MD;  Location: [facility];  Service: Radiology;  Laterality: N/A;   TEE WITHOUT CARDIOVERSION N/A 09/10/2020   Procedure: TRANSESOPHAGEAL ECHOCARDIOGRAM (TEE) WITH PROPOFOL;  Surgeon: Satira Sark, MD;  Location: [facility];  Service: Cardiovascular;  Laterality: N/A;    There were no vitals filed for this visit.   Subjective Assessment - 06/19/21 1455     Subjective  S: I haven't contacted the lady about the tub bench yet.    Patient is accompanied by: Family member   Nephew   Currently in Pain? No/denies                [facility] OT Assessment - 06/19/21 1510       Assessment   Medical Diagnosis S/P right MCA with left hemiparesis      Precautions   Precautions Fall    Precaution Comments left side hemiparesis, left side visual field deficits                      OT Treatments/Exercises (OP) - 06/19/21 1504       ADLs   UB Dressing Seated on edge of mat, patient completed upper body dressing using 3XL t-shirt over his own shirt using hemi dressing technique with supervision.  LB Dressing Seated in wheelchair, patient performed lower body dressing using larger sizer pants over his shorts using hemi dressing technique. Completed donning and doffing of shoes also.      Exercises   Exercises Shoulder      Neurological Re-education Exercises   Towel Slides Seated with table lowered to waist level with 3 cones placed on table (right, left, and center). Hand/arm placed in pillowcase patient performed AA/ROM shoulder flexion/extension, horizontal abduction/adduction, circles - both directions with OT providing active assistance and verbal cues along with muscle tapping for activation.                      OT Short Term Goals - 05/16/21 1415       OT SHORT TERM GOAL #1   Title Patient will be educated and independent with HEP  completion in order to faciliate his progress in therapy and begin to use his LUE as a gross assist for 25% or more of daily tasks.    Time 6    Period Weeks    Status On-going    Target Date 06/25/21      OT SHORT TERM GOAL #2   Title Patient will demonstrate a 25% increase in A/ROM of the LUE (shoulder, elbow, wrist) as able while using his LUE as a gross assist when completing self care tasks such as dressing.    Time 6    Period Weeks    Status On-going      OT SHORT TERM GOAL #3   Title Patient will demonstrate increased fine motor coordination in his left hand by completing the 9 hole peg test with peg set-up if needed.    Time 6    Period Weeks    Status On-going      OT SHORT TERM GOAL #4   Title Patient will increase is left hand strength to 10# and his pinch strength to 5# in order to begin to use his left hand to stabilize objects when completing self feeding.    Time 6    Period Weeks    Status On-going      OT SHORT TERM GOAL #5   Title Patient will completing bathing and dressing with Mod assist where needed while utilizing DME, Adaptive equipment, and/or compensatory techniques with improved sitting and standing balance.    Time 6    Period Weeks    Status On-going               OT Long Term Goals - 05/16/21 1415       OT LONG TERM GOAL #1   Title Patient will demonstrate improved LUE A/ROM by 50% or more while utilizing his LUE as his non dominant extremity to provide an active assist to complete tasks that require use of bilateral UE's.    Time 12    Period Weeks    Status On-going      OT LONG TERM GOAL #2   Title Patient will increase his LUE strength (shoulder, elbow, wrist) to 4/5 or better while demonstrating improved ability to complete bathing and dressing tasks.    Time 12    Period Weeks    Status On-going      OT LONG TERM GOAL #3   Title Patient will increase his fine motor coordination in his left hand by completing the 9 hole peg test  in the standardized fashion under 1'.    Time 12    Period Weeks  Status On-going      OT LONG TERM GOAL #4   Title Patient will demonstrate improved left hand grip strength to 15# and pinch strength to 8# in order to manipulate and hold onto items in his left hand without dropping.    Time 12    Period Weeks    Status On-going      OT LONG TERM GOAL #5   Title Patient will completing bathing and dressing with min assist where needed while utilizing DME, Adaptive equipment, and/or compensatory techniques with improved sitting and standing balance.    Status On-going                   Plan - 06/19/21 1511     Clinical Impression Statement A: Continued with ADL re-training with patient focusing on use of hemi dressing technique to complete both upper and lower body dressing. Patient was able to complete UB dressing using t-shirt with verbal and visual cues for adjustments. He did donn the shirt backwards (There were graphics on both front and back). Lower body dressing was completed seated in wheelchair while then standing with walker to pull pants up over hips. OT provided adjustment to pants after left leg was threaded through pant leg. When standing, he required min assist for balance and cues for safety and technique. Min assist provided for management pants over hips and prevent them from falling down.    Body Structure / Function / Physical Skills ADL;Decreased knowledge of use of DME;Strength;Tone;GMC;Balance;UE functional use;Vision;ROM;IADL;Sensation;Coordination;FMC    Plan P: Follow up on dressing at home and if he is completing with less assistance from sister. Use NMES unit prior to activities at table to faciliate muscle activation in the left forearm and hand.    Consulted and Agree with Plan of Care Patient             Patient will benefit from skilled therapeutic intervention in order to improve the following deficits and impairments:   Body Structure /  Function / Physical Skills: ADL, Decreased knowledge of use of DME, Strength, Tone, GMC, Balance, UE functional use, Vision, ROM, IADL, Sensation, Coordination, [facility]       Visit Diagnosis: Stiffness of left elbow, not elsewhere classified  Other lack of coordination  Stiffness of left hand, not elsewhere classified  Other symptoms and signs involving the musculoskeletal system    Problem List Patient Active Problem List   Diagnosis Date Noted   Urinary frequency    Dysphagia, post-stroke    Spastic hemiplegia affecting nondominant side (HCC)    Drug-induced hypotension    Pressure injury of skin 02/14/2021   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   Dyslipidemia    Hemiparesis affecting left side as late effect of stroke ([place])    Middle cerebral artery embolism, right 02/03/2021   Status post surgery 02/02/2021   Stroke (cerebrum) ([place]) 02/02/2021   Hypoglycemia 01/25/2021   Thyroid nodule 01/25/2021   Hypothyroidism 01/25/2021   Near syncope 01/21/2021   H/O adenomatous polyp of colon 12/30/2020   Acute ischemic stroke ([place]) 11/28/2020   Essential hypertension 11/28/2020   Mixed hyperlipidemia 11/28/2020   Acute CVA (cerebrovascular accident) ([place]) 09/09/2020   Transient ischemic attack 09/07/2020   History of hypertension 09/07/2020   Elevated troponin 09/07/2020   Dizziness 09/07/2020   Numbness and tingling 09/07/2020   H/O medication noncompliance 09/07/2020   Medicare annual wellness visit, subsequent 12/05/2019   Nicotine dependence 12/05/2019   Chronic kidney disease, stage  3 unspecified ([place]) 10/24/2019   Prediabetes 10/24/2019   Edema of foot 09/14/2017   Tubular adenoma of colon 09/14/2017   Long term (current) use of anticoagulants 11/14/2014   Sickle cell trait ([place]) 08/29/2012   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 [place], [place], [address] Phone: [phone]   Fax:  [phone]  Name: Matthew Wise MRN: [id number] Date of Birth: 1961-01-28

## 2021-06-20 ENCOUNTER — Other Ambulatory Visit (INDEPENDENT_AMBULATORY_CARE_PROVIDER_SITE_OTHER): Payer: Self-pay | Admitting: Internal Medicine

## 2021-06-20 ENCOUNTER — Encounter: Payer: Medicare Other | Admitting: Physical Medicine & Rehabilitation

## 2021-06-20 DIAGNOSIS — Z1211 Encounter for screening for malignant neoplasm of colon: Secondary | ICD-10-CM

## 2021-06-20 DIAGNOSIS — N1831 Chronic kidney disease, stage 3a: Secondary | ICD-10-CM

## 2021-06-23 ENCOUNTER — Other Ambulatory Visit: Payer: Self-pay

## 2021-06-23 ENCOUNTER — Ambulatory Visit (HOSPITAL_COMMUNITY): Payer: Medicare Other

## 2021-06-23 DIAGNOSIS — R29898 Other symptoms and signs involving the musculoskeletal system: Secondary | ICD-10-CM

## 2021-06-23 DIAGNOSIS — R2689 Other abnormalities of gait and mobility: Secondary | ICD-10-CM

## 2021-06-23 DIAGNOSIS — R278 Other lack of coordination: Secondary | ICD-10-CM | POA: Diagnosis not present

## 2021-06-23 DIAGNOSIS — R262 Difficulty in walking, not elsewhere classified: Secondary | ICD-10-CM

## 2021-06-23 DIAGNOSIS — M6281 Muscle weakness (generalized): Secondary | ICD-10-CM

## 2021-06-23 DIAGNOSIS — M25622 Stiffness of left elbow, not elsewhere classified: Secondary | ICD-10-CM | POA: Diagnosis not present

## 2021-06-23 DIAGNOSIS — M25642 Stiffness of left hand, not elsewhere classified: Secondary | ICD-10-CM | POA: Diagnosis not present

## 2021-06-23 NOTE — Therapy (Signed)
[place], [place], [address] Phone: [phone]   Fax:  [phone]  Physical Therapy Treatment  Patient Details  Name: Matthew Wise MRN: [id number] Date of Birth: 1961-02-16 Referring Provider (PT): Alysia Penna MD   Encounter Date: 06/23/2021   PT End of Session - 06/23/21 1400     Visit Number 9    Number of Visits 16    Date for PT Re-Evaluation 07/17/21    Authorization Type [employer] MEdicare    Progress Note Due on Visit 10    PT Start Time 1345    PT Stop Time 1430    PT Time Calculation (min) 45 min    Equipment Utilized During Treatment Gait belt   WC near   Activity Tolerance Patient tolerated treatment well    Behavior During Therapy 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 two simple tubular adenomas removed. patient was advised to go to [facility] for colonoscopy but he did not keep appt.   IR CT HEAD LTD  02/02/2021   IR PERCUTANEOUS ART THROMBECTOMY/INFUSION INTRACRANIAL INC DIAG ANGIO  02/02/2021   RADIOLOGY WITH ANESTHESIA  N/A 02/02/2021   Procedure: IR WITH ANESTHESIA;  Surgeon: Luanne Bras, MD;  Location: [facility];  Service: Radiology;  Laterality: N/A;   TEE WITHOUT CARDIOVERSION N/A 09/10/2020   Procedure: TRANSESOPHAGEAL ECHOCARDIOGRAM (TEE) WITH PROPOFOL;  Surgeon: Satira Sark, MD;  Location: [facility];  Service: Cardiovascular;  Laterality: N/A;    There were no vitals filed for this visit.                      [facility] Adult PT Treatment/Exercise - 06/23/21 0001       Transfers   Sit to Stand 4: Min guard    Stand to Sit 4: Min guard;With armrests    Stand Pivot Transfers 4: Min assist      Neuro Re-ed    Neuro Re-ed Details  Dynamic sitting EOM performing 3x10 partial sit-ups with emphasis on trunk flexion and reaching RUE across midline to left to facilitate left abdominal contraction. Stick wrestling sitting EOM x2 min to withstand postural perturbations. Supported standing and performing RLE lift-off 4x10 with therapist facilitate left knee/hip extension. Standing with draw sheet support to facilitate hip extension and pt performing lateral flexion left/right to improve trunk strength for proximal stability. Standing with support and performing RLE advancement 3x10 with therapist stabilizing LLE for extension.  PT Short Term Goals - 05/22/21 1641       PT SHORT TERM GOAL #1   Title Patient will be independent with initial HEP and self-management strategies to improve functional outcomes    Time 4    Period Weeks    Status New    Target Date 06/19/21      PT SHORT TERM GOAL #2   Title Patient will be Min A with transfers and bed mobility    Time 4    Period Weeks    Status New    Target Date 06/19/21               PT Long Term Goals - 05/22/21 1642       PT LONG TERM GOAL #1   Title Patient will be Mod I with bed mobility and transfers    Time 8    Period Weeks    Status New    Target Date 07/17/21      PT LONG TERM  GOAL #2   Title Patient will be able to ambulate >100 feet with LRAD for improved household mobility    Time 8    Period Weeks    Status New    Target Date 07/17/21      PT LONG TERM GOAL #3   Title Patient will have at least 4/5 MMT throughout LLE (except ankle DF) for improved functional mobility and stair/ step navigation.    Time 8    Period Weeks    Status New    Target Date 07/17/21                   Plan - 06/23/21 1433     Clinical Impression Statement Demonstrating improved coordination and improving trunk strength with trials in emphasizing upright, midline posture with ability to find neutral midline with decreased need for feedback and improving with weight shifting/acceptance to LLE with knee extension being facilitated/stabilized by therapist. Demo 2+ to 3-/5 left knee extension strength, trace to 2-/5 left tib anterior. Continued sessions indicated to improve motor control/coordination and progress for gait trials    Examination-Activity Limitations Caring for Others;Transfers;Locomotion Level;Toileting;Stand    Examination-Participation Restrictions Yard Work;Cleaning;Laundry;Community Activity    Stability/Clinical Decision Making Evolving/Moderate complexity    Rehab Potential Fair    PT Frequency 2x / week    PT Duration 8 weeks    PT Treatment/Interventions ADLs/Self Care Home Management;Aquatic Therapy;Biofeedback;Cryotherapy;Fluidtherapy;Contrast Bath;Electrical Stimulation;Therapeutic exercise;Orthotic Fit/Training;Patient/family education;Manual lymph drainage;Manual techniques;Dry needling;Energy conservation;Spinal Manipulations;Joint Manipulations;Splinting;Taping;Compression bandaging;Vasopneumatic Device;Therapeutic activities;Parrafin;Ultrasound;Traction;Functional mobility training;Neuromuscular re-education;Stair training;Gait training;Moist Heat;Iontophoresis '4mg'$ /ml Dexamethasone;DME Instruction;Balance training;Passive range of motion;Scar  mobilization;Wheelchair mobility training;Visual/perceptual remediation/compensation    PT Next Visit Plan Progress functional mobility as tolerated. Seated and mat strengthening, encourage WB through LT side. Standing balance> gait. Body-weight support harnass?    PT Home Exercise Plan Eval: marches, LAQs, heel/toe raise; 7/1:  bridge and sidelying clam 7/12 heel slide, supine hip abduction; tall sitting with lumbar support    Consulted and Agree with Plan of Care Patient             Patient will benefit from skilled therapeutic intervention in order to improve the following deficits and impairments:  Abnormal gait, Improper body mechanics, Impaired sensation, Decreased balance, Difficulty walking, Decreased activity tolerance, Decreased range of motion, Decreased strength, Hypomobility, Impaired tone, Decreased mobility, Decreased coordination  Visit Diagnosis: Muscle weakness (generalized)  Difficulty in walking, not elsewhere classified  Other abnormalities of gait and mobility     Problem  List Patient Active Problem List   Diagnosis Date Noted   Urinary frequency    Dysphagia, post-stroke    Spastic hemiplegia affecting nondominant side (HCC)    Drug-induced hypotension    Pressure injury of skin 02/14/2021   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   Dyslipidemia    Hemiparesis affecting left side as late effect of stroke ([place])    Middle cerebral artery embolism, right 02/03/2021   Status post surgery 02/02/2021   Stroke (cerebrum) ([place]) 02/02/2021   Hypoglycemia 01/25/2021   Thyroid nodule 01/25/2021   Hypothyroidism 01/25/2021   Near syncope 01/21/2021   H/O adenomatous polyp of colon 12/30/2020   Acute ischemic stroke ([place]) 11/28/2020   Essential hypertension 11/28/2020   Mixed hyperlipidemia 11/28/2020   Acute CVA (cerebrovascular accident) ([place]) 09/09/2020   Transient ischemic attack 09/07/2020   History of hypertension 09/07/2020   Elevated  troponin 09/07/2020   Dizziness 09/07/2020   Numbness and tingling 09/07/2020   H/O medication noncompliance 09/07/2020   Medicare annual wellness visit, subsequent 12/05/2019   Nicotine dependence 12/05/2019   Chronic kidney disease, stage 3 unspecified ([place]) 10/24/2019   Prediabetes 10/24/2019   Edema of foot 09/14/2017   Tubular adenoma of colon 09/14/2017   Long term (current) use of anticoagulants 11/14/2014   Sickle cell trait ([place]) 08/29/2012   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 Sherlyn Lees, PT, DPT Physical Therapist- [place] Office Number: [phone]   [facility] [address] [place], [place], [address] Phone: [phone]   Fax:  [phone]  Name: Matthew Wise MRN: [id number] Date of Birth: 26-May-1961

## 2021-06-24 ENCOUNTER — Encounter (HOSPITAL_COMMUNITY): Payer: Self-pay

## 2021-06-24 NOTE — Therapy (Signed)
[place], [place], [address] Phone: [phone]   Fax:  [phone]  Occupational Therapy Treatment  Patient Details  Name: Matthew Wise MRN: [id number] Date of Birth: [birth year] Referring Provider (OT): Alysia Penna, MD   Encounter Date: 06/23/2021   OT End of Session - 06/24/21 1254     Visit Number 10    Number of Visits 24    Date for OT Re-Evaluation 08/06/21   mini reassess: 07/08/21   Authorization Type UHC medicare    Authorization Time Period 11/30/20-end of year. $30 copay. 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 two simple tubular adenomas removed. patient was advised to go to [facility] for colonoscopy but he did not keep appt.   IR CT HEAD LTD  02/02/2021   IR PERCUTANEOUS ART THROMBECTOMY/INFUSION INTRACRANIAL INC  DIAG ANGIO  02/02/2021   RADIOLOGY WITH ANESTHESIA N/A 02/02/2021   Procedure: IR WITH ANESTHESIA;  Surgeon: Luanne Bras, MD;  Location: [place];  Service: Radiology;  Laterality: N/A;   TEE WITHOUT CARDIOVERSION N/A 09/10/2020   Procedure: TRANSESOPHAGEAL ECHOCARDIOGRAM (TEE) WITH PROPOFOL;  Surgeon: Satira Sark, MD;  Location: [facility];  Service: Cardiovascular;  Laterality: N/A;    There were no vitals filed for this visit.   Subjective Assessment - 06/24/21 1003     Subjective  S: Nothing new to report. Has not contacted the [employer] about a tub bench yet.    Patient is accompanied by: Family member   nephew   Currently in Pain? No/denies                [facility] OT Assessment - 06/24/21 1003       Assessment   Medical Diagnosis S/P right MCA with left hemiparesis      Precautions   Precautions Fall    Precaution Comments left side hemiparesis, left side visual field deficits                      OT Treatments/Exercises (OP) - 06/24/21 1004       Transfers   Transfers Sit to Stand;Stand to Sit    Sit to Stand 5: Supervision;With upper extremity assist    Stand to  Sit 4: Min guard;With armrests      Exercises   Exercises Shoulder      Neurological Re-education Exercises   Other Exercises 1 standing at table, pt provided with object 2.5 inches and then 5 inches tall. Completed combination of elbow flexion/extension with shoulder adduction and abduction to pick up hand from left side of object and take it to the right side of object and back. Completed 5 times.    Other Exercises 2 WIth use of NMES unit to provide AA/ORM of the left hand and wrist, patient completed finger and wrist extension during on ramp followed by finger flexion during off ramp.    Development of Reach Reaching    Reaching to Waist Standing at table, 4 cones used as targets, pt completed mainly Active assisted movement to tap each cone 4 times each.      Modalities    Modalities Teacher, English as a foreign language Location 1 channel used. left wrist extensors.    Warden/ranger Parameters 42 mA CC                      OT Short Term Goals - 05/16/21 1415       OT SHORT TERM GOAL #1   Title Patient will be educated and independent with HEP completion in order to faciliate his progress in therapy and begin to use his LUE as a gross assist for 25% or more of daily tasks.    Time 6    Period Weeks    Status On-going    Target Date 06/25/21      OT SHORT TERM GOAL #2   Title Patient will demonstrate a 25% increase in A/ROM of the LUE (shoulder, elbow, wrist) as able while using his LUE as a gross assist when completing self care tasks such as dressing.    Time 6    Period Weeks    Status On-going      OT SHORT TERM GOAL #3   Title Patient will demonstrate increased fine motor coordination in his left hand by completing the 9 hole peg test with peg set-up if needed.    Time 6    Period Weeks    Status On-going      OT SHORT TERM GOAL #4   Title Patient will increase is left hand strength to 10# and his pinch strength to 5# in order to begin to use his left hand to stabilize objects when completing self feeding.    Time 6    Period Weeks    Status On-going      OT SHORT TERM GOAL #5   Title Patient will completing bathing and dressing with Mod assist where needed while utilizing DME, Adaptive equipment, and/or compensatory techniques with improved sitting and standing balance.    Time 6    Period Weeks    Status On-going               OT Long Term Goals - 05/16/21 1415       OT LONG TERM GOAL #1   Title Patient will demonstrate improved LUE A/ROM by 50% or more while utilizing his LUE as his non dominant extremity to provide an active assist to complete tasks that require use of bilateral UE's.    Time 12    Period Weeks    Status  On-going  OT LONG TERM GOAL #2   Title Patient will increase his LUE strength (shoulder, elbow, wrist) to 4/5 or better while demonstrating improved ability to complete bathing and dressing tasks.    Time 12    Period Weeks    Status On-going      OT LONG TERM GOAL #3   Title Patient will increase his fine motor coordination in his left hand by completing the 9 hole peg test in the standardized fashion under 1'.    Time 12    Period Weeks    Status On-going      OT LONG TERM GOAL #4   Title Patient will demonstrate improved left hand grip strength to 15# and pinch strength to 8# in order to manipulate and hold onto items in his left hand without dropping.    Time 12    Period Weeks    Status On-going      OT LONG TERM GOAL #5   Title Patient will completing bathing and dressing with min assist where needed while utilizing DME, Adaptive equipment, and/or compensatory techniques with improved sitting and standing balance.    Status On-going                   Plan - 06/24/21 1307     Clinical Impression Statement A: NMES unit used to faciliate left UE wrist and finger extensors. Once NMES was finished, patient was able to demonstrate slight wrist extension with finger extension. Although A/ROM of the left UE is still limited, pt is able to demonstrate more active movement at each session. He is able to demonstrate elbow flexion in order to place foreaerm on table to rest.    Body Structure / Function / Physical Skills ADL;Decreased knowledge of use of DME;Strength;Tone;GMC;Balance;UE functional use;Vision;ROM;IADL;Sensation;Coordination;FMC    Plan P: Attempt supine AA/ROM. Tape left shoulder to decrease subluxation and increasing joint alignment and stability.             Patient will benefit from skilled therapeutic intervention in order to improve the following deficits and impairments:   Body Structure / Function / Physical Skills: ADL, Decreased knowledge of use  of DME, Strength, Tone, GMC, Balance, UE functional use, Vision, ROM, IADL, Sensation, Coordination, [facility]       Visit Diagnosis: Stiffness of left elbow, not elsewhere classified  Stiffness of left hand, not elsewhere classified  Other lack of coordination  Other symptoms and signs involving the musculoskeletal system    Problem List Patient Active Problem List   Diagnosis Date Noted   Urinary frequency    Dysphagia, post-stroke    Spastic hemiplegia affecting nondominant side (HCC)    Drug-induced hypotension    Pressure injury of skin 02/14/2021   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   Dyslipidemia    Hemiparesis affecting left side as late effect of stroke ([place])    Middle cerebral artery embolism, right 02/03/2021   Status post surgery 02/02/2021   Stroke (cerebrum) ([place]) 02/02/2021   Hypoglycemia 01/25/2021   Thyroid nodule 01/25/2021   Hypothyroidism 01/25/2021   Near syncope 01/21/2021   H/O adenomatous polyp of colon 12/30/2020   Acute ischemic stroke ([place]) 11/28/2020   Essential hypertension 11/28/2020   Mixed hyperlipidemia 11/28/2020   Acute CVA (cerebrovascular accident) ([place]) 09/09/2020   Transient ischemic attack 09/07/2020   History of hypertension 09/07/2020   Elevated troponin 09/07/2020   Dizziness 09/07/2020   Numbness and tingling 09/07/2020   H/O medication noncompliance 09/07/2020  Medicare annual wellness visit, subsequent 12/05/2019   Nicotine dependence 12/05/2019   Chronic kidney disease, stage 3 unspecified ([place]) 10/24/2019   Prediabetes 10/24/2019   Edema of foot 09/14/2017   Tubular adenoma of colon 09/14/2017   Long term (current) use of anticoagulants 11/14/2014   Sickle cell trait ([place]) 08/29/2012   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 [place], [place], [address] Phone: [phone]   Fax:  [phone]  Name: Matthew Wise MRN: [id number] Date of Birth: 05-16-1961

## 2021-06-25 ENCOUNTER — Ambulatory Visit (HOSPITAL_COMMUNITY): Payer: Medicare Other

## 2021-06-25 ENCOUNTER — Other Ambulatory Visit: Payer: Self-pay

## 2021-06-25 ENCOUNTER — Encounter (HOSPITAL_COMMUNITY): Payer: Self-pay

## 2021-06-25 ENCOUNTER — Encounter (HOSPITAL_COMMUNITY): Payer: Self-pay | Admitting: Physical Therapy

## 2021-06-25 ENCOUNTER — Ambulatory Visit (HOSPITAL_COMMUNITY): Payer: Medicare Other | Admitting: Physical Therapy

## 2021-06-25 DIAGNOSIS — M25642 Stiffness of left hand, not elsewhere classified: Secondary | ICD-10-CM

## 2021-06-25 DIAGNOSIS — R278 Other lack of coordination: Secondary | ICD-10-CM | POA: Diagnosis not present

## 2021-06-25 DIAGNOSIS — R2689 Other abnormalities of gait and mobility: Secondary | ICD-10-CM | POA: Diagnosis not present

## 2021-06-25 DIAGNOSIS — M6281 Muscle weakness (generalized): Secondary | ICD-10-CM | POA: Diagnosis not present

## 2021-06-25 DIAGNOSIS — R262 Difficulty in walking, not elsewhere classified: Secondary | ICD-10-CM | POA: Diagnosis not present

## 2021-06-25 DIAGNOSIS — M25622 Stiffness of left elbow, not elsewhere classified: Secondary | ICD-10-CM

## 2021-06-25 DIAGNOSIS — R29898 Other symptoms and signs involving the musculoskeletal system: Secondary | ICD-10-CM

## 2021-06-25 NOTE — Therapy (Signed)
[facility] [address] [place], [place], [address] Phone: [phone]   Fax:  [phone]  Physical Therapy Treatment  Patient Details  Name: Matthew Wise MRN: [id number] Date of Birth: 07/05/1961 Referring Provider (PT): Alysia Penna MD  Progress Note Reporting Period 05/22/21 to 06/25/21  See note below for Objective Data and Assessment of Progress/Goals.     Encounter Date: 06/25/2021   PT End of Session - 06/25/21 1120     Visit Number 10    Number of Visits 16    Date for PT Re-Evaluation 07/17/21    Authorization Type UHC MEdicare    Progress Note Due on Visit 10    PT Start Time 1118    PT Stop Time 1158    PT Time Calculation (min) 40 min    Equipment Utilized During Treatment Gait belt   WC near   Activity Tolerance Patient tolerated treatment well    Behavior During Therapy WFL for tasks assessed/performed             Past Medical History:  Diagnosis Date   Acute CVA (cerebrovascular accident) ([place]) 09/09/2020   Chronic anticoagulation 11/14/2014   CKD (chronic kidney disease) stage 3, GFR 30-59 ml/min ([place]) 10/24/2019   CKD (chronic kidney disease) stage 3, GFR 30-59 ml/min ([place]) 10/24/2019   Clotting disorder ([place])    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 two simple tubular adenomas removed. patient was advised to go to [facility] for colonoscopy but he did not keep appt.   IR CT  HEAD LTD  02/02/2021   IR PERCUTANEOUS ART THROMBECTOMY/INFUSION INTRACRANIAL INC DIAG ANGIO  02/02/2021   RADIOLOGY WITH ANESTHESIA N/A 02/02/2021   Procedure: IR WITH ANESTHESIA;  Surgeon: Luanne Bras, MD;  Location: [place];  Service: Radiology;  Laterality: N/A;   TEE WITHOUT CARDIOVERSION N/A 09/10/2020   Procedure: TRANSESOPHAGEAL ECHOCARDIOGRAM (TEE) WITH PROPOFOL;  Surgeon: Satira Sark, MD;  Location: [facility];  Service: Cardiovascular;  Laterality: N/A;    There were no vitals filed for this visit.   Subjective Assessment - 06/25/21 1119     Subjective Doing some walking with hemi walker and RW. No new complaints, no pain.    Pertinent History CVA 02/02/21    Patient Stated Goals Be able to get up and go without walker    Currently in Pain? No/denies                [facility] PT Assessment - 06/25/21 1121       Assessment   Medical Diagnosis S/P right MCA with left hemiparesis    Referring Provider (PT) Alysia Penna MD      Restrictions   Weight Bearing Restrictions No      Balance Screen   Has the patient fallen in the past 6 months No      Home Environment   Living Environment Private residence    Living Arrangements Other relatives  Available Help at Discharge Family      Prior Function   Level of Independence Independent    Vocation On disability      Cognition   Overall Cognitive Status Within Functional Limits for tasks assessed      Strength   Right Hip Flexion 4/5    Left Hip Flexion 3-/5    Right Knee Extension 4/5    Left Knee Extension 3-/5    Right Ankle Dorsiflexion 4/5    Left Ankle Dorsiflexion 2-/5      Bed Mobility   Rolling Right Independent    Rolling Left Supervision/Verbal cueing    Supine to Sit Minimal Assistance - Patient > 75%      Transfers   Stand to Sit 5: Supervision;4: Min guard      Ambulation/Gait   Ambulation/Gait Yes    Ambulation/Gait Assistance 4: Min assist    Ambulation Distance (Feet) 40 Feet     Assistive device Hemi-walker    Gait Pattern Step-to pattern;Decreased stance time - left;Decreased stride length;Decreased hip/knee flexion - left;Decreased dorsiflexion - left;Decreased weight shift to left;Left foot flat    Ambulation Surface Level;Indoor                           [facility] Adult PT Treatment/Exercise - 06/25/21 1121       Knee/Hip Exercises: Standing   Other Standing Knee Exercises lateral weight shifts x 10    Other Standing Knee Exercises LLE step taps on 2 inch box with HHA x 1      Knee/Hip Exercises: Seated   Long Arc Quad Both;2 sets;10 reps;Weights    Long Arc Quad Weight 2 lbs.    Sit to Sand 1 set;10 reps;with UE support   Cues for even weight shift, blue foam pad elevation                     PT Short Term Goals - 06/25/21 1638       PT SHORT TERM GOAL #1   Title Patient will be independent with initial HEP and self-management strategies to improve functional outcomes    Time 4    Period Weeks    Status On-going    Target Date 06/19/21      PT SHORT TERM GOAL #2   Title Patient will be Min A with transfers and bed mobility    Time 4    Period Weeks    Status Achieved    Target Date 06/19/21               PT Long Term Goals - 06/25/21 1638       PT LONG TERM GOAL #1   Title Patient will be Mod I with bed mobility and transfers    Time 8    Period Weeks    Status On-going      PT LONG TERM GOAL #2   Title Patient will be able to ambulate >100 feet with LRAD for improved household mobility    Baseline Current 40 feet with hemi walker    Time 8    Period Weeks    Status On-going      PT LONG TERM GOAL #3   Title Patient will have at least 4/5 MMT throughout LLE (except ankle DF) for improved functional mobility and stair/ step navigation.    Baseline See MMT    Time 8    Period Weeks  Status On-going                   Plan - 06/25/21 1637     Clinical Impression Statement Patient shows  good progress toward therapy goals. Improving LE strength, functional mobility transfers, and is able to ambulate using hemi walker with assistance. Patient remains limited by LLE weakness, coordination and decreased balance which continue to negatively impact functional ability. Patient will continue to benefit from skilled therapy services to address remaining deficits for improved functional mobility and reduced risk for falls.    Examination-Activity Limitations Caring for Others;Transfers;Locomotion Level;Toileting;Stand    Examination-Participation Restrictions Yard Work;Cleaning;Laundry;Community Activity    Stability/Clinical Decision Making Evolving/Moderate complexity    Rehab Potential Fair    PT Frequency 2x / week    PT Duration 8 weeks    PT Treatment/Interventions ADLs/Self Care Home Management;Aquatic Therapy;Biofeedback;Cryotherapy;Fluidtherapy;Contrast Bath;Electrical Stimulation;Therapeutic exercise;Orthotic Fit/Training;Patient/family education;Manual lymph drainage;Manual techniques;Dry needling;Energy conservation;Spinal Manipulations;Joint Manipulations;Splinting;Taping;Compression bandaging;Vasopneumatic Device;Therapeutic activities;Parrafin;Ultrasound;Traction;Functional mobility training;Neuromuscular re-education;Stair training;Gait training;Moist Heat;Iontophoresis '4mg'$ /ml Dexamethasone;DME Instruction;Balance training;Passive range of motion;Scar mobilization;Wheelchair mobility training;Visual/perceptual remediation/compensation    PT Next Visit Plan Progress functional mobility as tolerated. Seated and mat strengthening, encourage WB through LT side. Standing balance> gait. Body-weight support harnass?    PT Home Exercise Plan Eval: marches, LAQs, heel/toe raise; 7/1:  bridge and sidelying clam 7/12 heel slide, supine hip abduction; tall sitting with lumbar support    Consulted and Agree with Plan of Care Patient             Patient will benefit from skilled  therapeutic intervention in order to improve the following deficits and impairments:  Abnormal gait, Improper body mechanics, Impaired sensation, Decreased balance, Difficulty walking, Decreased activity tolerance, Decreased range of motion, Decreased strength, Hypomobility, Impaired tone, Decreased mobility, Decreased coordination  Visit Diagnosis: Muscle weakness (generalized)  Difficulty in walking, not elsewhere classified  Other abnormalities of gait and mobility     Problem List Patient Active Problem List   Diagnosis Date Noted   Urinary frequency    Dysphagia, post-stroke    Spastic hemiplegia affecting nondominant side (HCC)    Drug-induced hypotension    Pressure injury of skin 02/14/2021   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   Dyslipidemia    Hemiparesis affecting left side as late effect of stroke ([place])    Middle cerebral artery embolism, right 02/03/2021   Status post surgery 02/02/2021   Stroke (cerebrum) ([place]) 02/02/2021   Hypoglycemia 01/25/2021   Thyroid nodule 01/25/2021   Hypothyroidism 01/25/2021   Near syncope 01/21/2021   H/O adenomatous polyp of colon 12/30/2020   Acute ischemic stroke ([place]) 11/28/2020   Essential hypertension 11/28/2020   Mixed hyperlipidemia 11/28/2020   Acute CVA (cerebrovascular accident) ([place]) 09/09/2020   Transient ischemic attack 09/07/2020   History of hypertension 09/07/2020   Elevated troponin 09/07/2020   Dizziness 09/07/2020   Numbness and tingling 09/07/2020   H/O medication noncompliance 09/07/2020   Medicare annual wellness visit, subsequent 12/05/2019   Nicotine dependence 12/05/2019   Chronic kidney disease, stage 3 unspecified ([place]) 10/24/2019   Prediabetes 10/24/2019   Edema of foot 09/14/2017   Tubular adenoma of colon 09/14/2017   Long term (current) use of anticoagulants 11/14/2014   Sickle cell trait ([place]) 08/29/2012   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 [place], [place], [address] Phone: [phone]   Fax:  [phone]  Name: Matthew Wise MRN: [id number] Date of Birth: [birth year]

## 2021-06-26 NOTE — Therapy (Signed)
[place], [place], [address] Phone: [phone]   Fax:  [phone]  Occupational Therapy Treatment  Patient Details  Name: Matthew Wise MRN: [id number] Date of Birth: 04/27/1961 Referring Provider (OT): Alysia Penna, MD   Encounter Date: 06/25/2021   OT End of Session - 06/26/21 1554     Visit Number 11    Number of Visits 24    Date for OT Re-Evaluation 08/06/21   mini reassess: 07/08/21   Authorization Type [employer] medicare    Authorization Time Period 11/30/20-end of year. $30 copay. 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 two simple tubular adenomas removed. patient was advised to go to [facility] for colonoscopy but he did not keep appt.   IR CT HEAD LTD  02/02/2021   IR PERCUTANEOUS ART THROMBECTOMY/INFUSION INTRACRANIAL INC  DIAG ANGIO  02/02/2021   RADIOLOGY WITH ANESTHESIA N/A 02/02/2021   Procedure: IR WITH ANESTHESIA;  Surgeon: Luanne Bras, MD;  Location: [place];  Service: Radiology;  Laterality: N/A;   TEE WITHOUT CARDIOVERSION N/A 09/10/2020   Procedure: TRANSESOPHAGEAL ECHOCARDIOGRAM (TEE) WITH PROPOFOL;  Surgeon: Satira Sark, MD;  Location: [facility];  Service: Cardiovascular;  Laterality: N/A;    There were no vitals filed for this visit.   Subjective Assessment - 06/25/21 1051     Subjective  S: Nothing new to report. Has not practiced any Active movement at home with his LUE.    Patient is accompanied by: Family member   Nephew   Currently in Pain? No/denies                [facility] OT Assessment - 06/25/21 1052       Assessment   Medical Diagnosis S/P right MCA with left hemiparesis      Precautions   Precautions Fall    Precaution Comments left side hemiparesis, left side visual field deficits                      OT Treatments/Exercises (OP) - 06/25/21 1052       Transfers   Transfers Sit to Stand;Stand to Sit    Sit to Stand 4: Min guard    Stand to Sit 4: Min  guard;With armrests      ADLs   UB Dressing Able to doff pull over t-shirt while seated on edge of mat without physical assistance. Required set-up and verbal cues to locate tag to donn pull over t-shirt using hemi dressing technique.      Exercises   Exercises Shoulder;Elbow      Shoulder Exercises: Supine   Protraction AAROM;5 reps;Limitations    Protraction Limitations OT providing ~ 1-2 finger assist to unweight arm    External Rotation AAROM;5 reps   OT providing 1-2 finger assist for proper form   Internal Rotation AAROM;5 reps;Other (comment)   OT providing 1-2 finger assist for proper form   Flexion AAROM;5 reps;Limitations    Flexion Limitations Due to activation of bicep for elbow flexion, OT required to help decrease flexion of elbow while attempting to engagement shoulder to raise arm  off table.      Elbow Exercises   Other elbow exercises A/ROM elbow flexion/extension 10X; supine      Manual Therapy   Manual Therapy Taping    Manual therapy comments Manual therapy completed prior to exercises.    Kinesiotex Inhibit Muscle   joint stability                     OT Short Term Goals - 05/16/21 1415       OT SHORT TERM GOAL #1   Title Patient will be educated and independent with HEP completion in order to faciliate his progress in therapy and begin to use his LUE as a gross assist for 25% or more of daily tasks.    Time 6    Period Weeks    Status On-going    Target Date 06/25/21      OT SHORT TERM GOAL #2   Title Patient will demonstrate a 25% increase in A/ROM of the LUE (shoulder, elbow, wrist) as able while using his LUE as a gross assist when completing self care tasks such as dressing.    Time 6    Period Weeks    Status On-going      OT SHORT TERM GOAL #3   Title Patient will demonstrate increased fine motor coordination in his left hand by completing the 9 hole peg test with peg set-up if needed.    Time 6    Period Weeks    Status On-going      OT SHORT TERM GOAL #4   Title Patient will increase is left hand strength to 10# and his pinch strength to 5# in order to begin to use his left hand to stabilize objects when completing self feeding.    Time 6    Period Weeks    Status On-going      OT SHORT TERM GOAL #5   Title Patient will completing bathing and dressing with Mod assist where needed while utilizing DME, Adaptive equipment, and/or compensatory techniques with improved sitting and standing balance.    Time 6    Period Weeks    Status On-going               OT Long Term Goals - 05/16/21 1415       OT LONG TERM GOAL #1   Title Patient will demonstrate improved LUE A/ROM by 50% or more while utilizing his LUE as his non dominant extremity to provide an active assist to complete tasks that require use of bilateral UE's.     Time 12    Period  Weeks    Status On-going      OT LONG TERM GOAL #2   Title Patient will increase his LUE strength (shoulder, elbow, wrist) to 4/5 or better while demonstrating improved ability to complete bathing and dressing tasks.    Time 12    Period Weeks    Status On-going      OT LONG TERM GOAL #3   Title Patient will increase his fine motor coordination in his left hand by completing the 9 hole peg test in the standardized fashion under 1'.    Time 12    Period Weeks    Status On-going      OT LONG TERM GOAL #4   Title Patient will demonstrate improved left hand grip strength to 15# and pinch strength to 8# in order to manipulate and hold onto items in his left hand without dropping.    Time 12    Period Weeks    Status On-going      OT LONG TERM GOAL #5   Title Patient will completing bathing and dressing with min assist where needed while utilizing DME, Adaptive equipment, and/or compensatory techniques with improved sitting and standing balance.    Status On-going                   Plan - 06/26/21 1556     Clinical Impression Statement A: Kinsiotape applied to left shoulder AC joint to help decrease subluxation and increase joint stability. Patient completed supine AA/ROM with therapist providing active assist due to lack of adequate hand strength to hold onto pvc pipe. Verbal, tactile cues provided with muscle tapping to activate appropriate muscle groups. Patient is able to demonstrate good bicep flexion overall which causes increased difficulty to activate shoulder flexion when attempting.    Body Structure / Function / Physical Skills ADL;Decreased knowledge of use of DME;Strength;Tone;GMC;Balance;UE functional use;Vision;ROM;IADL;Sensation;Coordination;FMC    Plan P: Provide update HEP for self ROM/AA/ROM. Follow up on kinsiotape to left shoulder. Complete NMES to left shoulder to faciliate shoulder flexion.    Consulted and Agree with Plan of Care  Patient             Patient will benefit from skilled therapeutic intervention in order to improve the following deficits and impairments:   Body Structure / Function / Physical Skills: ADL, Decreased knowledge of use of DME, Strength, Tone, GMC, Balance, UE functional use, Vision, ROM, IADL, Sensation, Coordination, [facility]       Visit Diagnosis: Stiffness of left elbow, not elsewhere classified  Stiffness of left hand, not elsewhere classified  Other lack of coordination  Other symptoms and signs involving the musculoskeletal system    Problem List Patient Active Problem List   Diagnosis Date Noted   Urinary frequency    Dysphagia, post-stroke    Spastic hemiplegia affecting nondominant side (HCC)    Drug-induced hypotension    Pressure injury of skin 02/14/2021   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   Dyslipidemia    Hemiparesis affecting left side as late effect of stroke ([place])    Middle cerebral artery embolism, right 02/03/2021   Status post surgery 02/02/2021   Stroke (cerebrum) ([place]) 02/02/2021   Hypoglycemia 01/25/2021   Thyroid nodule 01/25/2021   Hypothyroidism 01/25/2021   Near syncope 01/21/2021   H/O adenomatous polyp of colon 12/30/2020   Acute ischemic stroke ([place]) 11/28/2020   Essential hypertension 11/28/2020   Mixed hyperlipidemia 11/28/2020   Acute CVA (cerebrovascular accident) ([place]) 09/09/2020  Transient ischemic attack 09/07/2020   History of hypertension 09/07/2020   Elevated troponin 09/07/2020   Dizziness 09/07/2020   Numbness and tingling 09/07/2020   H/O medication noncompliance 09/07/2020   Medicare annual wellness visit, subsequent 12/05/2019   Nicotine dependence 12/05/2019   Chronic kidney disease, stage 3 unspecified ([place]) 10/24/2019   Prediabetes 10/24/2019   Edema of foot 09/14/2017   Tubular adenoma of colon 09/14/2017   Long term (current) use of anticoagulants 11/14/2014   Sickle cell trait ([place])  08/29/2012   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 [place], [place], [address] Phone: [phone]   Fax:  [phone]  Name: Matthew Wise MRN: [id number] Date of Birth: 01-12-1961

## 2021-06-30 ENCOUNTER — Encounter (HOSPITAL_COMMUNITY): Payer: Self-pay

## 2021-06-30 ENCOUNTER — Ambulatory Visit (HOSPITAL_COMMUNITY): Payer: Medicare Other

## 2021-06-30 ENCOUNTER — Other Ambulatory Visit: Payer: Self-pay

## 2021-06-30 ENCOUNTER — Ambulatory Visit (HOSPITAL_COMMUNITY): Payer: Medicare Other | Attending: Physical Medicine & Rehabilitation

## 2021-06-30 DIAGNOSIS — R29898 Other symptoms and signs involving the musculoskeletal system: Secondary | ICD-10-CM | POA: Diagnosis not present

## 2021-06-30 DIAGNOSIS — R262 Difficulty in walking, not elsewhere classified: Secondary | ICD-10-CM | POA: Insufficient documentation

## 2021-06-30 DIAGNOSIS — M25622 Stiffness of left elbow, not elsewhere classified: Secondary | ICD-10-CM | POA: Insufficient documentation

## 2021-06-30 DIAGNOSIS — M6281 Muscle weakness (generalized): Secondary | ICD-10-CM | POA: Insufficient documentation

## 2021-06-30 DIAGNOSIS — M25642 Stiffness of left hand, not elsewhere classified: Secondary | ICD-10-CM | POA: Diagnosis not present

## 2021-06-30 DIAGNOSIS — R278 Other lack of coordination: Secondary | ICD-10-CM | POA: Diagnosis not present

## 2021-06-30 DIAGNOSIS — R2689 Other abnormalities of gait and mobility: Secondary | ICD-10-CM | POA: Diagnosis not present

## 2021-06-30 NOTE — Patient Instructions (Signed)
[email or web address]

## 2021-06-30 NOTE — Therapy (Signed)
[place], [place], [address] Phone: [phone]   Fax:  [phone]  Physical Therapy Treatment  Patient Details  Name: Matthew Wise MRN: [id number] Date of Birth: 1961-05-05 Referring Provider (PT): Alysia Penna MD   Encounter Date: 06/30/2021   PT End of Session - 06/30/21 1408     Visit Number 11    Number of Visits 16    Date for PT Re-Evaluation 07/17/21    Authorization Type [employer] MEdicare    Progress Note Due on Visit 10    PT Start Time 1345    PT Stop Time 1430    PT Time Calculation (min) 45 min    Equipment Utilized During Treatment Gait belt   WC near   Activity Tolerance Patient tolerated treatment well    Behavior During Therapy [facility] for tasks assessed/performed             Past Medical History:  Diagnosis Date   Acute CVA (cerebrovascular accident) ([place]) 09/09/2020   Chronic anticoagulation 11/14/2014   CKD (chronic kidney disease) stage 3, GFR 30-59 ml/min ([place]) 10/24/2019   CKD (chronic kidney disease) stage 3, GFR 30-59 ml/min ([place]) 10/24/2019   Clotting disorder ([place])    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 two simple tubular adenomas removed. patient was advised to go to [facility] for colonoscopy but he did not keep appt.   IR CT HEAD LTD  02/02/2021   IR PERCUTANEOUS ART THROMBECTOMY/INFUSION INTRACRANIAL INC DIAG ANGIO  02/02/2021   RADIOLOGY WITH ANESTHESIA  N/A 02/02/2021   Procedure: IR WITH ANESTHESIA;  Surgeon: Luanne Bras, MD;  Location: [place];  Service: Radiology;  Laterality: N/A;   TEE WITHOUT CARDIOVERSION N/A 09/10/2020   Procedure: TRANSESOPHAGEAL ECHOCARDIOGRAM (TEE) WITH PROPOFOL;  Surgeon: Satira Sark, MD;  Location: [facility];  Service: Cardiovascular;  Laterality: N/A;    There were no vitals filed for this visit.   Subjective Assessment - 06/30/21 1440     Subjective Feeling fine, limited activity at home due to level of help needed                [facility] PT Assessment - 06/30/21 0001       Assessment   Medical Diagnosis S/P right MCA with left hemiparesis    Referring Provider (PT) Alysia Penna MD                           [facility] Adult PT Treatment/Exercise - 06/30/21 0001       Transfers   Stand to Sit 4: Min guard    Stand Pivot Transfers 4: Min guard      Ambulation/Gait   Pre-Gait Activities performed with body-weight support harnass at steps with limb advancement to bottom step 4x10 reps RLE with therapist stabilizing LLE for hip/knee extension to facilitate RLE limb advancement. LLE advanced to bottom step 4x10 reps  with ace-wrap AFO on foot for clearance and mod A for LLE advancement to and from step.                      PT Short Term Goals - 06/25/21 1638       PT SHORT TERM GOAL #1   Title Patient will be independent with initial HEP and self-management strategies to improve functional outcomes    Time 4    Period Weeks    Status On-going    Target Date 06/19/21      PT SHORT TERM GOAL #2   Title Patient will be Min A with transfers and bed mobility    Time 4    Period Weeks    Status Achieved    Target Date 06/19/21               PT Long Term Goals - 06/25/21 1638       PT LONG TERM GOAL #1   Title Patient will be Mod I with bed mobility and transfers    Time 8    Period Weeks    Status On-going      PT LONG TERM GOAL #2   Title  Patient will be able to ambulate >100 feet with LRAD for improved household mobility    Baseline Current 40 feet with hemi walker    Time 8    Period Weeks    Status On-going      PT LONG TERM GOAL #3   Title Patient will have at least 4/5 MMT throughout LLE (except ankle DF) for improved functional mobility and stair/ step navigation.    Baseline See MMT    Time 8    Period Weeks    Status On-going                   Plan - 06/30/21 1441     Clinical Impression Statement Improved foot clearance with left dorsiflexion assist wrap and improving weight acceptance to LLE with goal of gross motor coordination and tolerating 2-4 min in standing position before fatigue mandates rest periods. Continued sessions indicated to improve LLE coordination/motor control and progress gait activities. Pt would likely benefit from left AFO to aide in safety with transfers and gait    Examination-Activity Limitations Caring for Others;Transfers;Locomotion Level;Toileting;Stand    Examination-Participation Restrictions Yard Work;Cleaning;Laundry;Community Activity    Stability/Clinical Decision Making Evolving/Moderate complexity    Rehab Potential Fair    PT Frequency 2x / week    PT Duration 8 weeks    PT Treatment/Interventions ADLs/Self Care Home Management;Aquatic Therapy;Biofeedback;Cryotherapy;Fluidtherapy;Contrast Bath;Electrical Stimulation;Therapeutic exercise;Orthotic Fit/Training;Patient/family education;Manual lymph drainage;Manual techniques;Dry needling;Energy conservation;Spinal Manipulations;Joint Manipulations;Splinting;Taping;Compression bandaging;Vasopneumatic Device;Therapeutic activities;Parrafin;Ultrasound;Traction;Functional mobility training;Neuromuscular re-education;Stair training;Gait training;Moist Heat;Iontophoresis '4mg'$ /ml Dexamethasone;DME Instruction;Balance training;Passive range of motion;Scar mobilization;Wheelchair mobility training;Visual/perceptual  remediation/compensation    PT Next Visit Plan Progress functional mobility as tolerated. Seated and mat strengthening, encourage WB through LT side. Standing balance> gait. Body-weight support harnass?    PT Home Exercise Plan Eval: marches, LAQs, heel/toe raise; 7/1:  bridge and sidelying clam 7/12 heel slide, supine hip abduction; tall sitting with lumbar support    Consulted and Agree with Plan of Care Patient             Patient will benefit from skilled therapeutic intervention in order to improve the following deficits and impairments:  Abnormal gait, Improper body mechanics, Impaired sensation, Decreased balance, Difficulty walking, Decreased activity tolerance, Decreased range of motion, Decreased strength, Hypomobility, Impaired  tone, Decreased mobility, Decreased coordination  Visit Diagnosis: Muscle weakness (generalized)  Difficulty in walking, not elsewhere classified  Other abnormalities of gait and mobility  Other symptoms and signs involving the musculoskeletal system     Problem List Patient Active Problem List   Diagnosis Date Noted   Urinary frequency    Dysphagia, post-stroke    Spastic hemiplegia affecting nondominant side (HCC)    Drug-induced hypotension    Pressure injury of skin 02/14/2021   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   Dyslipidemia    Hemiparesis affecting left side as late effect of stroke ([place])    Middle cerebral artery embolism, right 02/03/2021   Status post surgery 02/02/2021   Stroke (cerebrum) ([place]) 02/02/2021   Hypoglycemia 01/25/2021   Thyroid nodule 01/25/2021   Hypothyroidism 01/25/2021   Near syncope 01/21/2021   H/O adenomatous polyp of colon 12/30/2020   Acute ischemic stroke ([place]) 11/28/2020   Essential hypertension 11/28/2020   Mixed hyperlipidemia 11/28/2020   Acute CVA (cerebrovascular accident) ([place]) 09/09/2020   Transient ischemic attack 09/07/2020   History of hypertension 09/07/2020   Elevated  troponin 09/07/2020   Dizziness 09/07/2020   Numbness and tingling 09/07/2020   H/O medication noncompliance 09/07/2020   Medicare annual wellness visit, subsequent 12/05/2019   Nicotine dependence 12/05/2019   Chronic kidney disease, stage 3 unspecified ([place]) 10/24/2019   Prediabetes 10/24/2019   Edema of foot 09/14/2017   Tubular adenoma of colon 09/14/2017   Long term (current) use of anticoagulants 11/14/2014   Sickle cell trait ([place]) 08/29/2012   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 Sherlyn Lees, PT, DPT Physical Therapist- [place] Office Number: [phone]   [facility] [address] [place], [place], [address] Phone: [phone]   Fax:  [phone]  Name: Matthew Wise MRN: [id number] Date of Birth: Apr 25, 1961

## 2021-06-30 NOTE — Therapy (Signed)
[place], [place], [address] Phone: [phone]   Fax:  [phone]  Occupational Therapy Treatment  Patient Details  Name: Matthew Wise MRN: [id number] Date of Birth: [birth year] Referring Provider (OT): Alysia Penna, MD   Encounter Date: 06/30/2021   OT End of Session - 06/30/21 1539     Visit Number 12    Number of Visits 24    Date for OT Re-Evaluation 08/06/21   mini reassess: 07/08/21   Authorization Type [employer] medicare    Authorization Time Period 11/30/20-end of year. $30 copay. No visit limit    Progress Note Due on Visit 76    OT Start Time 1435   Pt in bathroom and session started later   OT Stop Time 1514    OT Time Calculation (min) 39 min    Activity Tolerance Patient tolerated treatment well    Behavior During Therapy [facility] for tasks assessed/performed             Past Medical History:  Diagnosis Date   Acute CVA (cerebrovascular accident) ([place]) 09/09/2020   Chronic anticoagulation 11/14/2014   CKD (chronic kidney disease) stage 3, GFR 30-59 ml/min ([place]) 10/24/2019   CKD (chronic kidney disease) stage 3, GFR 30-59 ml/min ([place]) 10/24/2019   Clotting disorder ([place])    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 two simple tubular adenomas removed. patient was advised to go to [facility] for colonoscopy but he did not keep appt.   IR CT HEAD LTD  02/02/2021   IR PERCUTANEOUS ART  THROMBECTOMY/INFUSION INTRACRANIAL INC DIAG ANGIO  02/02/2021   RADIOLOGY WITH ANESTHESIA N/A 02/02/2021   Procedure: IR WITH ANESTHESIA;  Surgeon: Luanne Bras, MD;  Location: [place] [facility];  Service: Radiology;  Laterality: N/A;   TEE WITHOUT CARDIOVERSION N/A 09/10/2020   Procedure: TRANSESOPHAGEAL ECHOCARDIOGRAM (TEE) WITH PROPOFOL;  Surgeon: Satira Sark, MD;  Location: [facility];  Service: Cardiovascular;  Laterality: N/A;    There were no vitals filed for this visit.   Subjective Assessment - 06/30/21 1448     Subjective  S: I used to do it in the hospital but it's too hard to do it at home. There's not enough room. (When talking about self propelling the wheelchair with his right arm and leg).    Currently in Pain? No/denies                [facility] OT Assessment - 06/30/21 1500       Assessment   Medical Diagnosis S/P right MCA with left hemiparesis      Precautions   Precautions Fall    Precaution Comments left side hemiparesis, left side visual field deficits                      OT Treatments/Exercises (OP) - 06/30/21 1450       Transfers   Sit to Stand 4: Min guard  Stand to Sit 4: Min guard      Exercises   Exercises Shoulder;Elbow;Wrist;Hand      Shoulder Exercises: Seated   External Rotation Other (comment);10 reps   self ROM   Internal Rotation Other (comment);10 reps   self ROM   Flexion Other (comment);10 reps   Self ROM   Abduction Other (comment);10 reps   self ROM     Elbow Exercises   Other elbow exercises Self ROM, seated, 10X each. Elbow flexion/extension, supination/pronation      Wrist Exercises   Wrist Flexion Self ROM;Seated;10 reps    Wrist Extension Self ROM;Seated;10 reps    Wrist Radial Deviation Self ROM;Seated;10 reps    Wrist Ulnar Deviation Self ROM;Seated;10 reps      Hand Exercises   Other Hand Exercises Self ROM of each finger individually: thumb through small finger; seated with hand on table, 10X each flexion  and extension    Other Hand Exercises Self ROM,Thumb and index finger stretch 5X 2" hold. Thumb and small finger stretch; 5X, 2" hold.                    OT Education - 06/30/21 1505     Education Details self ROM for LUE    Person(s) Educated Patient    Methods Explanation;Demonstration;Handout;Verbal cues    Comprehension Verbalized understanding;Returned demonstration              OT Short Term Goals - 05/16/21 1415       OT SHORT TERM GOAL #1   Title Patient will be educated and independent with HEP completion in order to faciliate his progress in therapy and begin to use his LUE as a gross assist for 25% or more of daily tasks.    Time 6    Period Weeks    Status On-going    Target Date 06/25/21      OT SHORT TERM GOAL #2   Title Patient will demonstrate a 25% increase in A/ROM of the LUE (shoulder, elbow, wrist) as able while using his LUE as a gross assist when completing self care tasks such as dressing.    Time 6    Period Weeks    Status On-going      OT SHORT TERM GOAL #3   Title Patient will demonstrate increased fine motor coordination in his left hand by completing the 9 hole peg test with peg set-up if needed.    Time 6    Period Weeks    Status On-going      OT SHORT TERM GOAL #4   Title Patient will increase is left hand strength to 10# and his pinch strength to 5# in order to begin to use his left hand to stabilize objects when completing self feeding.    Time 6    Period Weeks    Status On-going      OT SHORT TERM GOAL #5   Title Patient will completing bathing and dressing with Mod assist where needed while utilizing DME, Adaptive equipment, and/or compensatory techniques with improved sitting and standing balance.    Time 6    Period Weeks    Status On-going               OT Long Term Goals - 05/16/21 1415       OT LONG TERM GOAL #1   Title Patient will demonstrate improved LUE A/ROM by 50% or more while utilizing his LUE  as his non dominant  extremity to provide an active assist to complete tasks that require use of bilateral UE's.    Time 12    Period Weeks    Status On-going      OT LONG TERM GOAL #2   Title Patient will increase his LUE strength (shoulder, elbow, wrist) to 4/5 or better while demonstrating improved ability to complete bathing and dressing tasks.    Time 12    Period Weeks    Status On-going      OT LONG TERM GOAL #3   Title Patient will increase his fine motor coordination in his left hand by completing the 9 hole peg test in the standardized fashion under 1'.    Time 12    Period Weeks    Status On-going      OT LONG TERM GOAL #4   Title Patient will demonstrate improved left hand grip strength to 15# and pinch strength to 8# in order to manipulate and hold onto items in his left hand without dropping.    Time 12    Period Weeks    Status On-going      OT LONG TERM GOAL #5   Title Patient will completing bathing and dressing with min assist where needed while utilizing DME, Adaptive equipment, and/or compensatory techniques with improved sitting and standing balance.    Status On-going                   Plan - 06/30/21 1540     Clinical Impression Statement A: Pt was provided self ROM HEP during session. Completed all exercises for understanding of form and technique. Shoulder, elbow, forearm, wrist, and hand exercises were all included. Pt required verbal and some physical cues to complete exercises with proper form and technique. Pt was encouraged to complete HEP 1-2 times a day to help decrease LUE stiffness and increase blood flow.    Body Structure / Function / Physical Skills ADL;Decreased knowledge of use of DME;Strength;Tone;GMC;Balance;UE functional use;Vision;ROM;IADL;Sensation;Coordination;FMC    Plan P: Follow up on completion of self ROM HEP provided at last session. Complete NMES to left shoulder to faciliate shoulder flexion.    OT Home Exercise Plan 6/17  L UE weight bearing, table slides 8/1: self ROM LUE.    Consulted and Agree with Plan of Care Patient             Patient will benefit from skilled therapeutic intervention in order to improve the following deficits and impairments:   Body Structure / Function / Physical Skills: ADL, Decreased knowledge of use of DME, Strength, Tone, GMC, Balance, UE functional use, Vision, ROM, IADL, Sensation, Coordination, [facility]       Visit Diagnosis: Stiffness of left hand, not elsewhere classified  Stiffness of left elbow, not elsewhere classified  Other lack of coordination  Other symptoms and signs involving the musculoskeletal system    Problem List Patient Active Problem List   Diagnosis Date Noted   Urinary frequency    Dysphagia, post-stroke    Spastic hemiplegia affecting nondominant side (HCC)    Drug-induced hypotension    Pressure injury of skin 02/14/2021   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   Dyslipidemia    Hemiparesis affecting left side as late effect of stroke ([place])    Middle cerebral artery embolism, right 02/03/2021   Status post surgery 02/02/2021   Stroke (cerebrum) ([place]) 02/02/2021   Hypoglycemia 01/25/2021   Thyroid nodule 01/25/2021   Hypothyroidism 01/25/2021   Near syncope  01/21/2021   H/O adenomatous polyp of colon 12/30/2020   Acute ischemic stroke ([place]) 11/28/2020   Essential hypertension 11/28/2020   Mixed hyperlipidemia 11/28/2020   Acute CVA (cerebrovascular accident) ([place]) 09/09/2020   Transient ischemic attack 09/07/2020   History of hypertension 09/07/2020   Elevated troponin 09/07/2020   Dizziness 09/07/2020   Numbness and tingling 09/07/2020   H/O medication noncompliance 09/07/2020   Medicare annual wellness visit, subsequent 12/05/2019   Nicotine dependence 12/05/2019   Chronic kidney disease, stage 3 unspecified ([place]) 10/24/2019   Prediabetes 10/24/2019   Edema of foot 09/14/2017   Tubular adenoma of colon  09/14/2017   Long term (current) use of anticoagulants 11/14/2014   Sickle cell trait ([place]) 08/29/2012   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

## 2021-07-02 ENCOUNTER — Ambulatory Visit (HOSPITAL_COMMUNITY): Payer: Medicare Other | Admitting: Physical Therapy

## 2021-07-02 ENCOUNTER — Ambulatory Visit (HOSPITAL_COMMUNITY): Payer: Medicare Other

## 2021-07-02 ENCOUNTER — Encounter (HOSPITAL_COMMUNITY): Payer: Self-pay | Admitting: Physical Therapy

## 2021-07-02 ENCOUNTER — Other Ambulatory Visit: Payer: Self-pay

## 2021-07-02 DIAGNOSIS — R262 Difficulty in walking, not elsewhere classified: Secondary | ICD-10-CM | POA: Diagnosis not present

## 2021-07-02 DIAGNOSIS — M6281 Muscle weakness (generalized): Secondary | ICD-10-CM

## 2021-07-02 DIAGNOSIS — R29898 Other symptoms and signs involving the musculoskeletal system: Secondary | ICD-10-CM

## 2021-07-02 DIAGNOSIS — R2689 Other abnormalities of gait and mobility: Secondary | ICD-10-CM

## 2021-07-02 DIAGNOSIS — R278 Other lack of coordination: Secondary | ICD-10-CM

## 2021-07-02 DIAGNOSIS — M25642 Stiffness of left hand, not elsewhere classified: Secondary | ICD-10-CM

## 2021-07-02 DIAGNOSIS — M25622 Stiffness of left elbow, not elsewhere classified: Secondary | ICD-10-CM | POA: Diagnosis not present

## 2021-07-02 NOTE — Therapy (Signed)
[place], [place], [address] Phone: [phone]   Fax:  [phone]  Physical Therapy Treatment  Patient Details  Name: Matthew Wise MRN: [id number] Date of Birth: 1961/09/30 Referring Provider (PT): Alysia Penna MD   Encounter Date: 07/02/2021   PT End of Session - 07/02/21 1346     Visit Number 12    Number of Visits 16    Date for PT Re-Evaluation 07/17/21    Authorization Type [employer] M[employer]    Progress Note Due on Visit 10    PT Start Time 1346    PT Stop Time 1426    PT Time Calculation (min) 40 min    Equipment Utilized During Treatment Gait belt   WC near   Activity Tolerance Patient tolerated treatment well    Behavior During Therapy [facility] for tasks assessed/performed             Past Medical History:  Diagnosis Date   Acute CVA (cerebrovascular accident) ([place]) 09/09/2020   Chronic anticoagulation 11/14/2014   CKD (chronic kidney disease) stage 3, GFR 30-59 ml/min ([place]) 10/24/2019   CKD (chronic kidney disease) stage 3, GFR 30-59 ml/min ([place]) 10/24/2019   Clotting disorder ([place])    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 two simple tubular adenomas removed. patient was advised to go to [facility] for colonoscopy but he did not keep appt.   IR CT HEAD LTD  02/02/2021   IR PERCUTANEOUS ART THROMBECTOMY/INFUSION INTRACRANIAL INC DIAG ANGIO  02/02/2021   RADIOLOGY WITH ANESTHESIA  N/A 02/02/2021   Procedure: IR WITH ANESTHESIA;  Surgeon: Luanne Bras, MD;  Location: [facility];  Service: Radiology;  Laterality: N/A;   TEE WITHOUT CARDIOVERSION N/A 09/10/2020   Procedure: TRANSESOPHAGEAL ECHOCARDIOGRAM (TEE) WITH PROPOFOL;  Surgeon: Satira Sark, MD;  Location: [facility];  Service: Cardiovascular;  Laterality: N/A;    There were no vitals filed for this visit.   Subjective Assessment - 07/02/21 1350     Subjective No pain, things are fine    Currently in Pain? No/denies                               [facility] Adult PT Treatment/Exercise - 07/02/21 0001       Knee/Hip Exercises: Standing   Gait Training 65 feet with HW and WC follow    Other Standing Knee Exercises lateral weight shifts x 10    Other Standing Knee Exercises LLE step taps on 4 inch box with HHA x 1, 2 x 10 with 2lb ankle weight      Knee/Hip Exercises: Seated   Long Arc Quad Both;2 sets;10 reps;Weights    Long Arc Quad Weight 2 lbs.    Marching Both;2 sets;Weights;10 reps    Marching Weights 2 lbs.    Sit to Sand 1 set;10 reps;with UE support   elevated surface on  blue foam                     PT Short Term Goals - 06/25/21 1638       PT SHORT TERM GOAL #1   Title Patient will be independent with initial HEP and self-management strategies to improve functional outcomes    Time 4    Period Weeks    Status On-going    Target Date 06/19/21      PT SHORT TERM GOAL #2   Title Patient will be Min A with transfers and bed mobility    Time 4    Period Weeks    Status Achieved    Target Date 06/19/21               PT Long Term Goals - 06/25/21 1638       PT LONG TERM GOAL #1   Title Patient will be Mod I with bed mobility and transfers    Time 8    Period Weeks    Status On-going      PT LONG TERM GOAL #2   Title Patient will be able to ambulate >100 feet with LRAD for improved household mobility    Baseline Current 40 feet with hemi walker     Time 8    Period Weeks    Status On-going      PT LONG TERM GOAL #3   Title Patient will have at least 4/5 MMT throughout LLE (except ankle DF) for improved functional mobility and stair/ step navigation.    Baseline See MMT    Time 8    Period Weeks    Status On-going                   Plan - 07/02/21 1430     Clinical Impression Statement Patient progressing well. Increased ambulatory distance. Shows improving LE strengthening and control of LT LE. Requires verbal cues to for increased WB through LLE during sit to stands. Patient will continue to benefit from skilled therapy services to reduce deficits and improve functional ability.    Examination-Activity Limitations Caring for Others;Transfers;Locomotion Level;Toileting;Stand    Examination-Participation Restrictions Yard Work;Cleaning;Laundry;Community Activity    Stability/Clinical Decision Making Evolving/Moderate complexity    Rehab Potential Fair    PT Frequency 2x / week    PT Duration 8 weeks    PT Treatment/Interventions ADLs/Self Care Home Management;Aquatic Therapy;Biofeedback;Cryotherapy;Fluidtherapy;Contrast Bath;Electrical Stimulation;Therapeutic exercise;Orthotic Fit/Training;Patient/family education;Manual lymph drainage;Manual techniques;Dry needling;Energy conservation;Spinal Manipulations;Joint Manipulations;Splinting;Taping;Compression bandaging;Vasopneumatic Device;Therapeutic activities;Parrafin;Ultrasound;Traction;Functional mobility training;Neuromuscular re-education;Stair training;Gait training;Moist Heat;Iontophoresis '4mg'$ /ml Dexamethasone;DME Instruction;Balance training;Passive range of motion;Scar mobilization;Wheelchair mobility training;Visual/perceptual remediation/compensation    PT Next Visit Plan Progress functional mobility as tolerated. Seated and mat strengthening, encourage WB through LT side. Standing balance> gait. Body-weight support [place]?    PT Home Exercise Plan Eval: marches,  LAQs, heel/toe raise; 7/1:  bridge and sidelying clam 7/12 heel slide, supine hip abduction; tall sitting with lumbar support    Consulted and Agree with Plan of Care Patient             Patient will benefit from skilled therapeutic intervention in order to improve the following deficits and impairments:  Abnormal gait, Improper body mechanics, Impaired sensation, Decreased balance, Difficulty walking, Decreased activity tolerance, Decreased range of motion, Decreased strength, Hypomobility, Impaired tone, Decreased mobility, Decreased coordination  Visit Diagnosis: Muscle weakness (generalized)  Difficulty in walking, not elsewhere classified  Other abnormalities of gait and mobility  Other symptoms and signs involving  the musculoskeletal system     Problem List Patient Active Problem List   Diagnosis Date Noted   Urinary frequency    Dysphagia, post-stroke    Spastic hemiplegia affecting nondominant side (HCC)    Drug-induced hypotension    Pressure injury of skin 02/14/2021   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   Dyslipidemia    Hemiparesis affecting left side as late effect of stroke ([place])    Middle cerebral artery embolism, right 02/03/2021   Status post surgery 02/02/2021   Stroke (cerebrum) ([place]) 02/02/2021   Hypoglycemia 01/25/2021   Thyroid nodule 01/25/2021   Hypothyroidism 01/25/2021   Near syncope 01/21/2021   H/O adenomatous polyp of colon 12/30/2020   Acute ischemic stroke ([place]) 11/28/2020   Essential hypertension 11/28/2020   Mixed hyperlipidemia 11/28/2020   Acute CVA (cerebrovascular accident) ([place]) 09/09/2020   Transient ischemic attack 09/07/2020   History of hypertension 09/07/2020   Elevated troponin 09/07/2020   Dizziness 09/07/2020   Numbness and tingling 09/07/2020   H/O medication noncompliance 09/07/2020   Medicare annual wellness visit, subsequent 12/05/2019   Nicotine dependence 12/05/2019   Chronic kidney disease,  stage 3 unspecified ([place]) 10/24/2019   Prediabetes 10/24/2019   Edema of foot 09/14/2017   Tubular adenoma of colon 09/14/2017   Long term (current) use of anticoagulants 11/14/2014   Sickle cell trait ([place]) 08/29/2012   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

## 2021-07-03 ENCOUNTER — Encounter (HOSPITAL_COMMUNITY): Payer: Self-pay

## 2021-07-03 ENCOUNTER — Encounter (INDEPENDENT_AMBULATORY_CARE_PROVIDER_SITE_OTHER): Payer: Self-pay

## 2021-07-03 NOTE — Therapy (Signed)
[place], [place], [address] Phone: [phone]   Fax:  [phone]  Occupational Therapy Treatment  Patient Details  Name: Matthew Wise MRN: [id number] Date of Birth: 1961-04-21 Referring Provider (OT): Alysia Penna, MD   Encounter Date: 07/02/2021   OT End of Session - 07/03/21 1203     Visit Number 13    Number of Visits 24    Date for OT Re-Evaluation 08/06/21   mini reassess: 07/08/21   Authorization Type UHC medicare    Authorization Time Period 11/30/20-end of year. $30 copay. 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 two simple tubular adenomas removed. patient was advised to go to [facility] for colonoscopy but he did not keep appt.   IR CT HEAD LTD  02/02/2021   IR PERCUTANEOUS ART THROMBECTOMY/INFUSION INTRACRANIAL INC DIAG  ANGIO  02/02/2021   RADIOLOGY WITH ANESTHESIA N/A 02/02/2021   Procedure: IR WITH ANESTHESIA;  Surgeon: Luanne Bras, MD;  Location: [facility];  Service: Radiology;  Laterality: N/A;   TEE WITHOUT CARDIOVERSION N/A 09/10/2020   Procedure: TRANSESOPHAGEAL ECHOCARDIOGRAM (TEE) WITH PROPOFOL;  Surgeon: Satira Sark, MD;  Location: [facility];  Service: Cardiovascular;  Laterality: N/A;    There were no vitals filed for this visit.   Subjective Assessment - 07/03/21 1158     Subjective  S: No i haven't contacted her. (when asked about contacting Juliann Pulse regarding tub bench.)    Currently in Pain? No/denies                [facility] OT Assessment - 07/03/21 1159       Assessment   Medical Diagnosis S/P right MCA with left hemiparesis      Precautions   Precautions Fall    Precaution Comments left side hemiparesis, left side visual field deficits                      OT Treatments/Exercises (OP) - 07/03/21 1159       Exercises   Exercises Shoulder      Neurological Re-education Exercises   Other Exercises 1 With NMES unit, patient completed bilateral scapular elevation during on ramp while holding elevation for 10 seconds before relaxing.  Modalities   Modalities Teacher, English as a foreign language Location left upper trapezius    Electrical Stimulation Action scapular elevation    Electrical Stimulation Parameters 36 mA CC 10/10 duty cycle    Printmaker Goals Neuromuscular facilitation                    OT Education - 07/03/21 1201     Education Details reprinted Kathy's information from [employer] to inquire about extended tub bench. Provided phone number for [facility] [employer] per patient's request and  let him know that the front desk would set up his first pick up then he would need to call them to schedule every therpy appointment there after.    Person(s) Educated Patient     Methods Explanation;Handout    Comprehension Verbalized understanding              OT Short Term Goals - 05/16/21 1415       OT SHORT TERM GOAL #1   Title Patient will be educated and independent with HEP completion in order to faciliate his progress in therapy and begin to use his LUE as a gross assist for 25% or more of daily tasks.    Time 6    Period Weeks    Status On-going    Target Date 06/25/21      OT SHORT TERM GOAL #2   Title Patient will demonstrate a 25% increase in A/ROM of the LUE (shoulder, elbow, wrist) as able while using his LUE as a gross assist when completing self care tasks such as dressing.    Time 6    Period Weeks    Status On-going      OT SHORT TERM GOAL #3   Title Patient will demonstrate increased fine motor coordination in his left hand by completing the 9 hole peg test with peg set-up if needed.    Time 6    Period Weeks    Status On-going      OT SHORT TERM GOAL #4   Title Patient will increase is left hand strength to 10# and his pinch strength to 5# in order to begin to use his left hand to stabilize objects when completing self feeding.    Time 6    Period Weeks    Status On-going      OT SHORT TERM GOAL #5   Title Patient will completing bathing and dressing with Mod assist where needed while utilizing DME, Adaptive equipment, and/or compensatory techniques with improved sitting and standing balance.    Time 6    Period Weeks    Status On-going               OT Long Term Goals - 05/16/21 1415       OT LONG TERM GOAL #1   Title Patient will demonstrate improved LUE A/ROM by 50% or more while utilizing his LUE as his non dominant extremity to provide an active assist to complete tasks that require use of bilateral UE's.    Time 12    Period Weeks    Status On-going      OT LONG TERM GOAL #2   Title Patient will increase his LUE strength (shoulder, elbow, wrist) to 4/5 or better while demonstrating improved ability to  complete bathing and dressing tasks.    Time 12    Period Weeks    Status On-going  OT LONG TERM GOAL #3   Title Patient will increase his fine motor coordination in his left hand by completing the 9 hole peg test in the standardized fashion under 1'.    Time 12    Period Weeks    Status On-going      OT LONG TERM GOAL #4   Title Patient will demonstrate improved left hand grip strength to 15# and pinch strength to 8# in order to manipulate and hold onto items in his left hand without dropping.    Time 12    Period Weeks    Status On-going      OT LONG TERM GOAL #5   Title Patient will completing bathing and dressing with min assist where needed while utilizing DME, Adaptive equipment, and/or compensatory techniques with improved sitting and standing balance.    Status On-going                   Plan - 07/03/21 1203     Clinical Impression Statement A: Pt reports that he was not contacted [employer] because he financially does not have the money to purchase a tub bench. Informed patient that he does not need to pay for this tub bench. It is a company that was created to help provide DME to people who can not afford it or insurance does not cover it. Provided contact information to patient again. Focused session on attempting to achieve upper trapezius activation with use of NMES unit while he completed scapular elevation during on ramp. Slight elevation noted during use of NMES unit. Pt reports that he has been completing the self ROM HEP at home.    Body Structure / Function / Physical Skills ADL;Decreased knowledge of use of DME;Strength;Tone;GMC;Balance;UE functional use;Vision;ROM;IADL;Sensation;Coordination;FMC    Plan P: NMES unit to faciliate scapular retraction.    Consulted and Agree with Plan of Care Patient             Patient will benefit from skilled therapeutic intervention in order to improve the following deficits and impairments:   Body  Structure / Function / Physical Skills: ADL, Decreased knowledge of use of DME, Strength, Tone, GMC, Balance, UE functional use, Vision, ROM, IADL, Sensation, Coordination, [facility]       Visit Diagnosis: Stiffness of left hand, not elsewhere classified  Other symptoms and signs involving the musculoskeletal system  Stiffness of left elbow, not elsewhere classified  Other lack of coordination    Problem List Patient Active Problem List   Diagnosis Date Noted   Urinary frequency    Dysphagia, post-stroke    Spastic hemiplegia affecting nondominant side (HCC)    Drug-induced hypotension    Pressure injury of skin 02/14/2021   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   Dyslipidemia    Hemiparesis affecting left side as late effect of stroke ([place])    Middle cerebral artery embolism, right 02/03/2021   Status post surgery 02/02/2021   Stroke (cerebrum) ([place]) 02/02/2021   Hypoglycemia 01/25/2021   Thyroid nodule 01/25/2021   Hypothyroidism 01/25/2021   Near syncope 01/21/2021   H/O adenomatous polyp of colon 12/30/2020   Acute ischemic stroke ([place]) 11/28/2020   Essential hypertension 11/28/2020   Mixed hyperlipidemia 11/28/2020   Acute CVA (cerebrovascular accident) ([place]) 09/09/2020   Transient ischemic attack 09/07/2020   History of hypertension 09/07/2020   Elevated troponin 09/07/2020   Dizziness 09/07/2020   Numbness and tingling 09/07/2020   H/O medication noncompliance 09/07/2020   Medicare  annual wellness visit, subsequent 12/05/2019   Nicotine dependence 12/05/2019   Chronic kidney disease, stage 3 unspecified ([place]) 10/24/2019   Prediabetes 10/24/2019   Edema of foot 09/14/2017   Tubular adenoma of colon 09/14/2017   Long term (current) use of anticoagulants 11/14/2014   Sickle cell trait ([place]) 08/29/2012   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

## 2021-07-04 ENCOUNTER — Telehealth: Payer: Self-pay | Admitting: Internal Medicine

## 2021-07-04 NOTE — Telephone Encounter (Signed)
   DARIUSZ JUNTUNEN DOB: 1961/02/04 MRN: [id number]   RIDER WAIVER AND RELEASE OF LIABILITY  For purposes of improving physical access to our facilities, [facility] is pleased to partner with third parties to provide [facility] patients or other authorized individuals the option of convenient, on-demand ground transportation services (the [employer]") through use of the technology service that enables users to request on-demand ground transportation from independent third-party providers.  By opting to use and accept these [employer], I, the undersigned, hereby agree on behalf of myself, and on behalf of any minor child using the Government social research officer for whom I am the parent or legal guardian, as follows:  Government social research officer provided to me are provided by independent third-party transportation providers who are not [employer] or employees and who are unaffiliated with [employer]. [facility] is neither a transportation carrier nor a common or public carrier. [facility] has no control over the quality or safety of the transportation that occurs as a result of the [employer]. [facility] cannot guarantee that any third-party transportation provider will complete any arranged transportation service. [facility] makes no representation, warranty, or guarantee regarding the reliability, timeliness, quality, safety, suitability, or availability of any of the Transport Services or that they will be error free. I fully understand that traveling by vehicle involves risks and dangers of serious bodily injury, including permanent disability, paralysis, and death. I agree, on behalf of myself and on behalf of any minor child using the Transport Services for whom I am the parent or legal guardian, that the entire risk arising out of my use of the [employer] remains solely with me, to the maximum extent permitted under applicable law. The [employer] are provided "as  is" and "as available." [facility] disclaims all representations and warranties, express, implied or statutory, not expressly set out in these terms, including the implied warranties of merchantability and fitness for a particular purpose. I hereby waive and release [facility], its agents, employees, officers, directors, representatives, insurers, attorneys, assigns, successors, subsidiaries, and affiliates from any and all past, present, or future claims, demands, liabilities, actions, causes of action, or suits of any kind directly or indirectly arising from acceptance and use of the [employer]. I further waive and release [facility] and its affiliates from all present and future liability and responsibility for any injury or death to persons or damages to property caused by or related to the use of the [employer]. I have read this Waiver and Release of Liability, and I understand the terms used in it and their legal significance. This Waiver is freely and voluntarily given with the understanding that my right (as well as the right of any minor child for whom I am the parent or legal guardian using the [employer]) to legal recourse against  in connection with the [employer] is knowingly surrendered in return for use of these services.   I attest that I read the consent document to [name], gave Mr. Mcnamar the opportunity to ask questions and answered the questions asked (if any). I affirm that [name] then provided consent for he's participation in this program.     Darrick Meigs Vilsaint

## 2021-07-07 ENCOUNTER — Encounter (HOSPITAL_COMMUNITY): Payer: Self-pay

## 2021-07-07 ENCOUNTER — Ambulatory Visit (HOSPITAL_COMMUNITY): Payer: Medicare Other

## 2021-07-07 ENCOUNTER — Other Ambulatory Visit: Payer: Self-pay

## 2021-07-07 DIAGNOSIS — M6281 Muscle weakness (generalized): Secondary | ICD-10-CM

## 2021-07-07 DIAGNOSIS — R2689 Other abnormalities of gait and mobility: Secondary | ICD-10-CM | POA: Diagnosis not present

## 2021-07-07 DIAGNOSIS — R262 Difficulty in walking, not elsewhere classified: Secondary | ICD-10-CM

## 2021-07-07 DIAGNOSIS — R29898 Other symptoms and signs involving the musculoskeletal system: Secondary | ICD-10-CM

## 2021-07-07 DIAGNOSIS — M25622 Stiffness of left elbow, not elsewhere classified: Secondary | ICD-10-CM | POA: Diagnosis not present

## 2021-07-07 DIAGNOSIS — R278 Other lack of coordination: Secondary | ICD-10-CM

## 2021-07-07 DIAGNOSIS — M25642 Stiffness of left hand, not elsewhere classified: Secondary | ICD-10-CM | POA: Diagnosis not present

## 2021-07-07 NOTE — Therapy (Signed)
[place], [place], [address] Phone: [phone]   Fax:  [phone]  Physical Therapy Treatment  Patient Details  Name: Matthew Wise MRN: [id number] Date of Birth: 1961-11-21 Referring Provider (PT): Alysia Penna MD   Encounter Date: 07/07/2021   PT End of Session - 07/07/21 1413     Visit Number 13    Number of Visits 16    Date for PT Re-Evaluation 07/17/21    Authorization Type UHC MEdicare    Progress Note Due on Visit 10    PT Start Time 1345    PT Stop Time 1430    PT Time Calculation (min) 45 min    Equipment Utilized During Treatment Gait belt   WC near   Activity Tolerance Patient tolerated treatment well    Behavior During Therapy [facility] for tasks assessed/performed             Past Medical History:  Diagnosis Date   Acute CVA (cerebrovascular accident) ([place]) 09/09/2020   Chronic anticoagulation 11/14/2014   CKD (chronic kidney disease) stage 3, GFR 30-59 ml/min ([place]) 10/24/2019   CKD (chronic kidney disease) stage 3, GFR 30-59 ml/min ([place]) 10/24/2019   Clotting disorder ([place])    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 two simple tubular adenomas removed. patient was advised to go to [facility] for colonoscopy but he did not keep appt.   IR CT HEAD LTD  02/02/2021   IR PERCUTANEOUS ART THROMBECTOMY/INFUSION INTRACRANIAL INC DIAG ANGIO  02/02/2021   RADIOLOGY WITH ANESTHESIA  N/A 02/02/2021   Procedure: IR WITH ANESTHESIA;  Surgeon: Luanne Bras, MD;  Location: [employer] [facility];  Service: Radiology;  Laterality: N/A;   TEE WITHOUT CARDIOVERSION N/A 09/10/2020   Procedure: TRANSESOPHAGEAL ECHOCARDIOGRAM (TEE) WITH PROPOFOL;  Surgeon: Satira Sark, MD;  Location: [facility];  Service: Cardiovascular;  Laterality: N/A;    There were no vitals filed for this visit.   Subjective Assessment - 07/07/21 1414     Subjective Feeling better. "I've been trying to walk with help at home"                               [facility] Adult PT Treatment/Exercise - 07/07/21 0001       Ambulation/Gait   Ambulation/Gait Yes    Ambulation/Gait Assistance 4: Min assist    Ambulation Distance (Feet) 40 Feet    Assistive device Large base quad cane    Gait Pattern Step-to pattern;Decreased stance time - left;Decreased stride length;Decreased hip/knee flexion - left;Decreased dorsiflexion - left;Decreased weight shift to left;Left foot flat    Gait Comments techniuqes to facilitate LLE stance stability to improve RLE step length and performed with emphasis on negotiating obstacles. Trials with ace wrap "AFO" with improved foot clearance but causes external rotation of LLE in swing.      Neuro Re-ed    Neuro  Re-ed Details  techniuqes to facilitate midline position in sitting/standing via postural perturbation and pushing against therapist 3x3 sec left/right. Seated trunk rotation 3x10 reps with therapist stabilizing LUE on ball in order to improve visual field awareness.  Sit to stands with visual cue for keeping RLE farther to front and therapist maintaining LLE in flexed position and providing tibial compression to facilitate sit to stand with LLE bias                      PT Short Term Goals - 06/25/21 1638       PT SHORT TERM GOAL #1   Title Patient will be independent with initial HEP and self-management strategies to improve functional outcomes    Time 4     Period Weeks    Status On-going    Target Date 06/19/21      PT SHORT TERM GOAL #2   Title Patient will be Min A with transfers and bed mobility    Time 4    Period Weeks    Status Achieved    Target Date 06/19/21               PT Long Term Goals - 06/25/21 1638       PT LONG TERM GOAL #1   Title Patient will be Mod I with bed mobility and transfers    Time 8    Period Weeks    Status On-going      PT LONG TERM GOAL #2   Title Patient will be able to ambulate >100 feet with LRAD for improved household mobility    Baseline Current 40 feet with hemi walker    Time 8    Period Weeks    Status On-going      PT LONG TERM GOAL #3   Title Patient will have at least 4/5 MMT throughout LLE (except ankle DF) for improved functional mobility and stair/ step navigation.    Baseline See MMT    Time 8    Period Weeks    Status On-going                   Plan - 07/07/21 1431     Clinical Impression Statement Demonstrating improved postural control and able to achieve midline with moderate tactile cue for self-correction. Continued sessions indicated to improve motor control, balance, and facilitate ambulation    Examination-Activity Limitations Caring for Others;Transfers;Locomotion Level;Toileting;Stand    Examination-Participation Restrictions Yard Work;Cleaning;Laundry;Community Activity             Patient will benefit from skilled therapeutic intervention in order to improve the following deficits and impairments:  Abnormal gait, Improper body mechanics, Impaired sensation, Decreased balance, Difficulty walking, Decreased activity tolerance, Decreased range of motion, Decreased strength, Hypomobility, Impaired tone, Decreased mobility, Decreased coordination  Visit Diagnosis: Muscle weakness (generalized)  Difficulty in walking, not elsewhere classified  Other abnormalities of gait and mobility  Other symptoms and signs involving the musculoskeletal  system     Problem List Patient Active Problem List   Diagnosis Date Noted   Urinary frequency    Dysphagia, post-stroke    Spastic hemiplegia affecting nondominant side (HCC)    Drug-induced hypotension    Pressure injury of skin 02/14/2021   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   Dyslipidemia    Hemiparesis affecting left side as late effect of stroke ([place])    Middle cerebral artery embolism, right 02/03/2021   Status post  surgery 02/02/2021   Stroke (cerebrum) ([place]) 02/02/2021   Hypoglycemia 01/25/2021   Thyroid nodule 01/25/2021   Hypothyroidism 01/25/2021   Near syncope 01/21/2021   H/O adenomatous polyp of colon 12/30/2020   Acute ischemic stroke ([place]) 11/28/2020   Essential hypertension 11/28/2020   Mixed hyperlipidemia 11/28/2020   Acute CVA (cerebrovascular accident) ([place]) 09/09/2020   Transient ischemic attack 09/07/2020   History of hypertension 09/07/2020   Elevated troponin 09/07/2020   Dizziness 09/07/2020   Numbness and tingling 09/07/2020   H/O medication noncompliance 09/07/2020   Medicare annual wellness visit, subsequent 12/05/2019   Nicotine dependence 12/05/2019   Chronic kidney disease, stage 3 unspecified ([place]) 10/24/2019   Prediabetes 10/24/2019   Edema of foot 09/14/2017   Tubular adenoma of colon 09/14/2017   Long term (current) use of anticoagulants 11/14/2014   Sickle cell trait ([place]) 08/29/2012   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 Sherlyn Lees, PT, DPT Physical Therapist- [facility] Office Number: [phone]   [facility] [address] [place], [place], [address] Phone: [phone]   Fax:  [phone]  Name: YASMANI GADWAY MRN: [id number] Date of Birth:  Jun 24, 1961

## 2021-07-08 ENCOUNTER — Encounter (HOSPITAL_COMMUNITY): Payer: Self-pay

## 2021-07-08 NOTE — Therapy (Signed)
[facility] [address] [place], [place], [address] Phone: [phone]   Fax:  [phone]  Occupational Therapy Treatment Progress note Patient Details  Name: Matthew Wise MRN: [id number] Date of Birth: Oct 26, 1961 Referring Provider (OT): Alysia Penna, MD  Progress Note Reporting Period 06/10/21 to 07/07/21  See note below for Objective Data and Assessment of Progress/Goals.      Encounter Date: 07/07/2021   OT End of Session - 07/08/21 1127     Visit Number 14    Number of Visits 24    Date for OT Re-Evaluation 08/06/21    Authorization Type [employer] medicare    Authorization Time Period 11/30/20-end of year. $30 copay. No visit limit    Progress Note Due on Visit 24    OT Start Time 1433   progress note   OT Stop Time 1517    OT Time Calculation (min) 44 min    Activity Tolerance Patient tolerated treatment well    Behavior During Therapy WFL for tasks assessed/performed             Past Medical History:  Diagnosis Date   Acute CVA (cerebrovascular accident) ([place]) 09/09/2020   Chronic anticoagulation 11/14/2014   CKD (chronic kidney disease) stage 3, GFR 30-59 ml/min ([place]) 10/24/2019   CKD (chronic kidney disease) stage 3, GFR 30-59 ml/min ([place]) 10/24/2019   Clotting disorder ([place])    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 two simple tubular adenomas removed. patient was advised to go to [facility]  for colonoscopy but he did not keep appt.   IR CT HEAD LTD  02/02/2021   IR PERCUTANEOUS ART THROMBECTOMY/INFUSION INTRACRANIAL INC DIAG ANGIO  02/02/2021   RADIOLOGY WITH ANESTHESIA N/A 02/02/2021   Procedure: IR WITH ANESTHESIA;  Surgeon: Luanne Bras, MD;  Location: [facility];  Service: Radiology;  Laterality: N/A;   TEE WITHOUT CARDIOVERSION N/A 09/10/2020   Procedure: TRANSESOPHAGEAL ECHOCARDIOGRAM (TEE) WITH PROPOFOL;  Surgeon: Satira Sark, MD;  Location: [facility];  Service: Cardiovascular;  Laterality: N/A;    There were no vitals filed for this visit.   Subjective Assessment - 07/08/21 1108     Subjective  S: I like to have my socks and shoes on before I stand up to pull my pants up.    Currently in Pain? No/denies                [facility] OT Assessment - 07/07/21 1437       Assessment   Medical Diagnosis S/P right MCA with left hemiparesis      Precautions   Precautions Fall    Precaution Comments left side hemiparesis, left side visual field deficits      ROM / Strength   AROM / PROM / Strength AROM      AROM   Overall AROM Comments Assessed seated. IR/er adducted.    AROM Assessment Site Shoulder    Right/Left Shoulder Left  Left Shoulder Flexion 20 Degrees   previous: same   Left Shoulder ABduction 32 Degrees   previous: 20   Left Shoulder Internal Rotation 90 Degrees   previous: 49   Left Shoulder External Rotation -16 Degrees   preivous: 0   Right/Left Elbow Left    Left Elbow Flexion 120   previous: 123   Left Elbow Extension 0   previous: -35   Right/Left Forearm Left    Left Forearm Supination 52 Degrees   previous: same   Right/Left Wrist Left    Left Wrist Extension 10 Degrees   prevous: 34   Left Wrist Flexion 46 Degrees   previous: 30   Right/Left Finger Left    Left Composite Finger Extension 75%   previous: 50%   Left Composite Finger Flexion 75%   previous: 50%     Strength   Overall Strength Comments grip and pinch strength completed  with OT providing hand set up.    Right/Left hand Left    Left Hand Grip (lbs) 5   previous: 0   Left Hand Lateral Pinch 2 lbs   previous: 0   Left Hand 3 Point Pinch 0 lbs   same                     OT Treatments/Exercises (OP) - 07/08/21 1111       Exercises   Exercises Shoulder      Shoulder Exercises: Seated   External Rotation AROM;10 reps   seated used table top for support   Internal Rotation AROM;10 reps   seated using table top for support.                     OT Short Term Goals - 07/07/21 1450       OT SHORT TERM GOAL #1   Title Patient will be educated and independent with HEP completion in order to faciliate his progress in therapy and begin to use his LUE as a gross assist for 25% or more of daily tasks.    Time 6    Period Weeks    Status Achieved    Target Date 06/25/21      OT SHORT TERM GOAL #2   Title Patient will demonstrate a 25% increase in A/ROM of the LUE (shoulder, elbow, wrist) as able while using his LUE as a gross assist when completing self care tasks such as dressing.    Time 6    Period Weeks    Status On-going      OT SHORT TERM GOAL #3   Title Patient will demonstrate increased fine motor coordination in his left hand by completing the 9 hole peg test with peg set-up if needed.    Baseline 8/8: Unable to complete even with ste up provided.    Time 6    Period Weeks    Status On-going      OT SHORT TERM GOAL #4   Title Patient will increase is left hand strength to 10# and his pinch strength to 5# in order to begin to use his left hand to stabilize objects when completing self feeding.    Baseline 8/8: grip strength: 5#, lateral pinch 2#, 3 point pinch: 0    Time 6    Period Weeks    Status On-going      OT SHORT TERM GOAL #5   Title Patient will completing bathing and dressing with Mod assist where needed while utilizing  DME, Adaptive equipment, and/or compensatory techniques with improved sitting and standing  balance.    Time 6    Period Weeks    Status Partially Met               OT Long Term Goals - 05/16/21 1415       OT LONG TERM GOAL #1   Title Patient will demonstrate improved LUE A/ROM by 50% or more while utilizing his LUE as his non dominant extremity to provide an active assist to complete tasks that require use of bilateral UE's.    Time 12    Period Weeks    Status On-going      OT LONG TERM GOAL #2   Title Patient will increase his LUE strength (shoulder, elbow, wrist) to 4/5 or better while demonstrating improved ability to complete bathing and dressing tasks.    Time 12    Period Weeks    Status On-going      OT LONG TERM GOAL #3   Title Patient will increase his fine motor coordination in his left hand by completing the 9 hole peg test in the standardized fashion under 1'.    Time 12    Period Weeks    Status On-going      OT LONG TERM GOAL #4   Title Patient will demonstrate improved left hand grip strength to 15# and pinch strength to 8# in order to manipulate and hold onto items in his left hand without dropping.    Time 12    Period Weeks    Status On-going      OT LONG TERM GOAL #5   Title Patient will completing bathing and dressing with min assist where needed while utilizing DME, Adaptive equipment, and/or compensatory techniques with improved sitting and standing balance.    Status On-going                   Plan - 07/08/21 1128     Clinical Impression Statement A: Mini reassess/progress note completed this date. Patient has met 1 STG and partially met one other STG. He has not called to obtain a tub bench/shower chair. He hopes to receive a shower chair and complete a shower once a week using his own walk in shower versus the tub/shower at his sister's home. He is presenting with some improvement with A/ROM primarily in the hand and elbow. He is able to register grip strength and lateral pinch strength for the first time since evaluation.  Left shoulder and wrist A/ROM has remained unchanged at this time. He reports that he is still sponge bathing due to the lack of shower DME. He has been provided with the contact information for [employer] which provides DME to people that are unable to afford it or insurance does not cover. He has not reached out to this company yet. He reports that he is completing bathing at Moderate assist. While completing upper dressing, he requires assistance to ensure the proper orientation of his shirt prior to donning. He does well getting his shorts started and once standing, his sister will pull them up over his hips. He reports that his sister will donn his shoes and socks in bed prior to him standing. Recommended having patient donn these items while seated on EOB instead for increased independence. Estimate that dressing is completed at Moderate assistance level. Due to left shoulder subluxation, a hemi sling will be ordered and trialed to use only when ambulating  to allow for proper positioning of the shoulder joint.    Body Structure / Function / Physical Skills ADL;Decreased knowledge of use of DME;Strength;Tone;GMC;Balance;UE functional use;Vision;ROM;IADL;Sensation;Coordination;FMC    Plan P: Trial hemi sling when it arrives. Standing low level shoulder ROM. grasp and release task at table.    Consulted and Agree with Plan of Care Patient             Patient will benefit from skilled therapeutic intervention in order to improve the following deficits and impairments:   Body Structure / Function / Physical Skills: ADL, Decreased knowledge of use of DME, Strength, Tone, GMC, Balance, UE functional use, Vision, ROM, IADL, Sensation, Coordination, [facility]       Visit Diagnosis: Stiffness of left hand, not elsewhere classified  Other lack of coordination  Other symptoms and signs involving the musculoskeletal system  Stiffness of left elbow, not elsewhere classified    Problem  List Patient Active Problem List   Diagnosis Date Noted   Urinary frequency    Dysphagia, post-stroke    Spastic hemiplegia affecting nondominant side (HCC)    Drug-induced hypotension    Pressure injury of skin 02/14/2021   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   Dyslipidemia    Hemiparesis affecting left side as late effect of stroke ([place])    Middle cerebral artery embolism, right 02/03/2021   Status post surgery 02/02/2021   Stroke (cerebrum) ([place]) 02/02/2021   Hypoglycemia 01/25/2021   Thyroid nodule 01/25/2021   Hypothyroidism 01/25/2021   Near syncope 01/21/2021   H/O adenomatous polyp of colon 12/30/2020   Acute ischemic stroke ([place]) 11/28/2020   Essential hypertension 11/28/2020   Mixed hyperlipidemia 11/28/2020   Acute CVA (cerebrovascular accident) ([place]) 09/09/2020   Transient ischemic attack 09/07/2020   History of hypertension 09/07/2020   Elevated troponin 09/07/2020   Dizziness 09/07/2020   Numbness and tingling 09/07/2020   H/O medication noncompliance 09/07/2020   Medicare annual wellness visit, subsequent 12/05/2019   Nicotine dependence 12/05/2019   Chronic kidney disease, stage 3 unspecified ([place]) 10/24/2019   Prediabetes 10/24/2019   Edema of foot 09/14/2017   Tubular adenoma of colon 09/14/2017   Long term (current) use of anticoagulants 11/14/2014   Sickle cell trait ([place]) 08/29/2012   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 [place], [place], [address] Phone: [phone]   Fax:  [phone]  Name: Matthew Wise MRN: [id number] Date of Birth: 1961-05-07

## 2021-07-09 ENCOUNTER — Ambulatory Visit (HOSPITAL_COMMUNITY): Payer: Medicare Other | Admitting: Physical Therapy

## 2021-07-09 ENCOUNTER — Encounter (HOSPITAL_COMMUNITY): Payer: Self-pay | Admitting: Physical Therapy

## 2021-07-09 ENCOUNTER — Other Ambulatory Visit: Payer: Self-pay

## 2021-07-09 ENCOUNTER — Ambulatory Visit (HOSPITAL_COMMUNITY): Payer: Medicare Other

## 2021-07-09 ENCOUNTER — Encounter (HOSPITAL_COMMUNITY): Payer: Self-pay

## 2021-07-09 DIAGNOSIS — M6281 Muscle weakness (generalized): Secondary | ICD-10-CM | POA: Diagnosis not present

## 2021-07-09 DIAGNOSIS — R2689 Other abnormalities of gait and mobility: Secondary | ICD-10-CM

## 2021-07-09 DIAGNOSIS — M25622 Stiffness of left elbow, not elsewhere classified: Secondary | ICD-10-CM

## 2021-07-09 DIAGNOSIS — M25642 Stiffness of left hand, not elsewhere classified: Secondary | ICD-10-CM | POA: Diagnosis not present

## 2021-07-09 DIAGNOSIS — R29898 Other symptoms and signs involving the musculoskeletal system: Secondary | ICD-10-CM

## 2021-07-09 DIAGNOSIS — R262 Difficulty in walking, not elsewhere classified: Secondary | ICD-10-CM | POA: Diagnosis not present

## 2021-07-09 DIAGNOSIS — R278 Other lack of coordination: Secondary | ICD-10-CM

## 2021-07-09 NOTE — Therapy (Signed)
[facility] [place], [place], [address] Phone: [phone]   Fax:  [phone]  Physical Therapy Treatment  Patient Details  Name: Matthew Wise MRN: [id number] Date of Birth: 12/05/1960 Referring Provider (PT): Alysia Penna MD   Encounter Date: 07/09/2021   PT End of Session - 07/09/21 1042     Visit Number 14    Number of Visits 16    Date for PT Re-Evaluation 07/17/21    Authorization Type [employer] MEdicare    Progress Note Due on Visit 10    PT Start Time 1034    PT Stop Time 1112    PT Time Calculation (min) 38 min    Equipment Utilized During Treatment Gait belt   WC near   Activity Tolerance Patient tolerated treatment well    Behavior During Therapy [facility] for tasks assessed/performed             Past Medical History:  Diagnosis Date   Acute CVA (cerebrovascular accident) ([place]) 09/09/2020   Chronic anticoagulation 11/14/2014   CKD (chronic kidney disease) stage 3, GFR 30-59 ml/min ([place]) 10/24/2019   CKD (chronic kidney disease) stage 3, GFR 30-59 ml/min ([place]) 10/24/2019   Clotting disorder ([place])    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 two simple tubular adenomas removed. patient was advised to go to [facility] for colonoscopy but he did not keep appt.   IR CT HEAD LTD  02/02/2021   IR PERCUTANEOUS ART THROMBECTOMY/INFUSION INTRACRANIAL INC DIAG ANGIO  02/02/2021   RADIOLOGY WITH  ANESTHESIA N/A 02/02/2021   Procedure: IR WITH ANESTHESIA;  Surgeon: Luanne Bras, MD;  Location: [facility];  Service: Radiology;  Laterality: N/A;   TEE WITHOUT CARDIOVERSION N/A 09/10/2020   Procedure: TRANSESOPHAGEAL ECHOCARDIOGRAM (TEE) WITH PROPOFOL;  Surgeon: Satira Sark, MD;  Location: [facility];  Service: Cardiovascular;  Laterality: N/A;    There were no vitals filed for this visit.   Subjective Assessment - 07/09/21 1042     Subjective No new issues. Doing good    Currently in Pain? No/denies                               [facility] Adult PT Treatment/Exercise - 07/09/21 0001       Knee/Hip Exercises: Aerobic   Nustep 4 min Lv 1 for mobility and sequencing      Knee/Hip Exercises: Standing   Other Standing Knee Exercises Attempted sit to stand with foam under RT to encourage even WB. Patient very challenged by this, continues to shift to RT, unable to correct with VC      Knee/Hip Exercises: Seated   Other Seated Knee/Hip Exercises LAQ seated on elevated low mat with 2# weight 2 x 10, seated marching 2# 2 x 10    Other Seated Knee/Hip Exercises object step over, x10 lateral over 2# dumbbell seated  PT Short Term Goals - 06/25/21 1638       PT SHORT TERM GOAL #1   Title Patient will be independent with initial HEP and self-management strategies to improve functional outcomes    Time 4    Period Weeks    Status On-going    Target Date 06/19/21      PT SHORT TERM GOAL #2   Title Patient will be Min A with transfers and bed mobility    Time 4    Period Weeks    Status Achieved    Target Date 06/19/21               PT Long Term Goals - 06/25/21 1638       PT LONG TERM GOAL #1   Title Patient will be Mod I with bed mobility and transfers    Time 8    Period Weeks    Status On-going      PT LONG TERM GOAL #2   Title Patient will be able to ambulate >100 feet with LRAD for improved household mobility     Baseline Current 40 feet with hemi walker    Time 8    Period Weeks    Status On-going      PT LONG TERM GOAL #3   Title Patient will have at least 4/5 MMT throughout LLE (except ankle DF) for improved functional mobility and stair/ step navigation.    Baseline See MMT    Time 8    Period Weeks    Status On-going                   Plan - 07/09/21 1111     Clinical Impression Statement Patient demos increased fatigue today, some increased difficulty coordinating LLE. Requires cues to avoid LLE external rotation in swing. LOB x 1 during gait training due to LE fatigue, corrected with therapist assist. Trialed sit to stand with foam pad at RLE to encourage even WB, patient very challenged with this and unable to maintain even weight shifting. Denies fatigue, but demos it, slower moving, more labored transfers today. Will continue to progress functional activity as tolerated.    Examination-Activity Limitations Caring for Others;Transfers;Locomotion Level;Toileting;Stand    Examination-Participation Restrictions Yard Work;Cleaning;Laundry;Community Activity    PT Next Visit Plan Progress functional mobility as tolerated. Seated and mat strengthening, encourage WB through LT side. Standing balance> gait. Body-weight support harnass?    PT Home Exercise Plan Eval: marches, LAQs, heel/toe raise; 7/1:  bridge and sidelying clam 7/12 heel slide, supine hip abduction; tall sitting with lumbar support    Consulted and Agree with Plan of Care Patient             Patient will benefit from skilled therapeutic intervention in order to improve the following deficits and impairments:  Abnormal gait, Improper body mechanics, Impaired sensation, Decreased balance, Difficulty walking, Decreased activity tolerance, Decreased range of motion, Decreased strength, Hypomobility, Impaired tone, Decreased mobility, Decreased coordination  Visit Diagnosis: Muscle weakness (generalized)  Difficulty  in walking, not elsewhere classified  Other abnormalities of gait and mobility  Other symptoms and signs involving the musculoskeletal system     Problem List Patient Active Problem List   Diagnosis Date Noted   Urinary frequency    Dysphagia, post-stroke    Spastic hemiplegia affecting nondominant side (HCC)    Drug-induced hypotension    Pressure injury of skin 02/14/2021   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  Dyslipidemia    Hemiparesis affecting left side as late effect of stroke [facility])    Middle cerebral artery embolism, right 02/03/2021   Status post surgery 02/02/2021   Stroke (cerebrum) ([place]) 02/02/2021   Hypoglycemia 01/25/2021   Thyroid nodule 01/25/2021   Hypothyroidism 01/25/2021   Near syncope 01/21/2021   H/O adenomatous polyp of colon 12/30/2020   Acute ischemic stroke ([place]) 11/28/2020   Essential hypertension 11/28/2020   Mixed hyperlipidemia 11/28/2020   Acute CVA (cerebrovascular accident) ([place]) 09/09/2020   Transient ischemic attack 09/07/2020   History of hypertension 09/07/2020   Elevated troponin 09/07/2020   Dizziness 09/07/2020   Numbness and tingling 09/07/2020   H/O medication noncompliance 09/07/2020   Medicare annual wellness visit, subsequent 12/05/2019   Nicotine dependence 12/05/2019   Chronic kidney disease, stage 3 unspecified ([place]) 10/24/2019   Prediabetes 10/24/2019   Edema of foot 09/14/2017   Tubular adenoma of colon 09/14/2017   Long term (current) use of anticoagulants 11/14/2014   Sickle cell trait ([place]) 08/29/2012   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 [place], [place], [address] Phone: [phone]   Fax:  [phone]  Name: Matthew Wise MRN: [id number] Date of Birth: 04-10-1961

## 2021-07-09 NOTE — Therapy (Signed)
[place], [place], [address] Phone: [phone]   Fax:  [phone]  Occupational Therapy Treatment  Patient Details  Name: Matthew Wise MRN: [id number] Date of Birth: [birth year] Referring Provider (OT): Alysia Penna, MD   Encounter Date: 07/09/2021   OT End of Session - 07/09/21 1339     Visit Number 15    Number of Visits 24    Date for OT Re-Evaluation 08/06/21    Authorization Type [employer] medicare    Authorization Time Period 11/30/20-end of year. $30 copay. 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 two simple tubular adenomas removed. patient was advised to go to [facility] for colonoscopy but he did not keep appt.   IR CT HEAD LTD  02/02/2021   IR PERCUTANEOUS ART THROMBECTOMY/INFUSION INTRACRANIAL INC DIAG ANGIO  02/02/2021    RADIOLOGY WITH ANESTHESIA N/A 02/02/2021   Procedure: IR WITH ANESTHESIA;  Surgeon: Luanne Bras, MD;  Location: [facility];  Service: Radiology;  Laterality: N/A;   TEE WITHOUT CARDIOVERSION N/A 09/10/2020   Procedure: TRANSESOPHAGEAL ECHOCARDIOGRAM (TEE) WITH PROPOFOL;  Surgeon: Satira Sark, MD;  Location: [facility];  Service: Cardiovascular;  Laterality: N/A;    There were no vitals filed for this visit.   Subjective Assessment - 07/09/21 1127     Subjective  S: I woke up earlier today at 7.    Currently in Pain? No/denies                [facility] OT Assessment - 07/09/21 1128       Assessment   Medical Diagnosis S/P right MCA with left hemiparesis      Precautions   Precautions Fall    Precaution Comments left side hemiparesis, left side visual field deficits                      OT Treatments/Exercises (OP) - 07/09/21 1128       Exercises   Exercises Hand;Shoulder      Hand Exercises   Sponges Focusing on Active assist with arm resting on table top while seated. Pt working on activating shoulder and hand mobility to grasp and release 5 sponges from left side to right side.  Neurological Re-education Exercises   Grasp and Release Thumb Opposition    Other Grasp and Release Exercises  Completed grasp and release task while alternating between standing and seated while utilizing ring tree focusing on active shoulder, elbow, and hand movement. Placed 1 ring on first level and 1 ring on vertical pole. While standing, patient removed 1 ring from 3rd level, 2 rings from 2nd level, and 3 rings from bottom level.                      OT Short Term Goals - 07/09/21 1343       OT SHORT TERM GOAL #1   Title Patient will be educated and independent with HEP completion in order to faciliate his progress in therapy and begin to use his LUE as a gross assist for 25% or more of daily tasks.    Time 6    Period Weeks    Target Date 06/25/21       OT SHORT TERM GOAL #2   Title Patient will demonstrate a 25% increase in A/ROM of the LUE (shoulder, elbow, wrist) as able while using his LUE as a gross assist when completing self care tasks such as dressing.    Time 6    Period Weeks    Status On-going      OT SHORT TERM GOAL #3   Title Patient will demonstrate increased fine motor coordination in his left hand by completing the 9 hole peg test with peg set-up if needed.    Baseline 8/8: Unable to complete even with ste up provided.    Time 6    Period Weeks    Status On-going      OT SHORT TERM GOAL #4   Title Patient will increase is left hand strength to 10# and his pinch strength to 5# in order to begin to use his left hand to stabilize objects when completing self feeding.    Baseline 8/8: grip strength: 5#, lateral pinch 2#, 3 point pinch: 0    Time 6    Period Weeks    Status On-going      OT SHORT TERM GOAL #5   Title Patient will completing bathing and dressing with Mod assist where needed while utilizing DME, Adaptive equipment, and/or compensatory techniques with improved sitting and standing balance.    Time 6    Period Weeks    Status Partially Met               OT Long Term Goals - 05/16/21 1415       OT LONG TERM GOAL #1   Title Patient will demonstrate improved LUE A/ROM by 50% or more while utilizing his LUE as his non dominant extremity to provide an active assist to complete tasks that require use of bilateral UE's.    Time 12    Period Weeks    Status On-going      OT LONG TERM GOAL #2   Title Patient will increase his LUE strength (shoulder, elbow, wrist) to 4/5 or better while demonstrating improved ability to complete bathing and dressing tasks.    Time 12    Period Weeks    Status On-going      OT LONG TERM GOAL #3   Title Patient will increase his fine motor coordination in his left hand by completing the 9 hole peg test in the standardized fashion under 1'.    Time 12    Period Weeks  Status On-going      OT LONG TERM GOAL #4   Title Patient will demonstrate improved left hand grip strength to 15# and pinch strength to 8# in order to manipulate and hold onto items in his left hand without dropping.    Time 12    Period Weeks    Status On-going      OT LONG TERM GOAL #5   Title Patient will completing bathing and dressing with min assist where needed while utilizing DME, Adaptive equipment, and/or compensatory techniques with improved sitting and standing balance.    Status On-going                   Plan - 07/09/21 1339     Clinical Impression Statement A: Session focused on active functional use of the LUE while seated or standing. Patient required increased time and rest breaks to complete grasp and release task while transporting 5 sponges due to muscle fatigue. Max difficulty maintaining a functional lateral pinch on rings due to limited muscle strength and increased muscle fatigue in left thumb.    Body Structure / Function / Physical Skills ADL;Decreased knowledge of use of DME;Strength;Tone;GMC;Balance;UE functional use;Vision;ROM;IADL;Sensation;Coordination;FMC    Plan P: Trial hemi sling when it arrives. Continue to work on increasing functional use of left UE during grasp and release activities and low level reaching. ROM Arc.    Consulted and Agree with Plan of Care Patient             Patient will benefit from skilled therapeutic intervention in order to improve the following deficits and impairments:   Body Structure / Function / Physical Skills: ADL, Decreased knowledge of use of DME, Strength, Tone, GMC, Balance, UE functional use, Vision, ROM, IADL, Sensation, Coordination, [facility]       Visit Diagnosis: Other symptoms and signs involving the musculoskeletal system  Stiffness of left hand, not elsewhere classified  Other lack of coordination  Stiffness of left elbow, not elsewhere classified    Problem List Patient Active Problem  List   Diagnosis Date Noted   Urinary frequency    Dysphagia, post-stroke    Spastic hemiplegia affecting nondominant side (HCC)    Drug-induced hypotension    Pressure injury of skin 02/14/2021   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   Dyslipidemia    Hemiparesis affecting left side as late effect of stroke ([place])    Middle cerebral artery embolism, right 02/03/2021   Status post surgery 02/02/2021   Stroke (cerebrum) ([place]) 02/02/2021   Hypoglycemia 01/25/2021   Thyroid nodule 01/25/2021   Hypothyroidism 01/25/2021   Near syncope 01/21/2021   H/O adenomatous polyp of colon 12/30/2020   Acute ischemic stroke ([place]) 11/28/2020   Essential hypertension 11/28/2020   Mixed hyperlipidemia 11/28/2020   Acute CVA (cerebrovascular accident) ([place]) 09/09/2020   Transient ischemic attack 09/07/2020   History of hypertension 09/07/2020   Elevated troponin 09/07/2020   Dizziness 09/07/2020   Numbness and tingling 09/07/2020   H/O medication noncompliance 09/07/2020   Medicare annual wellness visit, subsequent 12/05/2019   Nicotine dependence 12/05/2019   Chronic kidney disease, stage 3 unspecified ([place]) 10/24/2019   Prediabetes 10/24/2019   Edema of foot 09/14/2017   Tubular adenoma of colon 09/14/2017   Long term (current) use of anticoagulants 11/14/2014   Sickle cell trait ([place]) 08/29/2012   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 [place], [place], [address] Phone: [phone]   Fax:  [phone]  Name: Matthew Wise MRN: [id number] Date of Birth: Mar 04, 1961

## 2021-07-10 ENCOUNTER — Telehealth (HOSPITAL_COMMUNITY): Payer: Self-pay

## 2021-07-10 NOTE — Telephone Encounter (Signed)
S/w pt on 8/10 concerning this apptment 8/22 PT will not be here- he was running late and the PT came and got him to start tx. Then he never came back to r/s

## 2021-07-13 DIAGNOSIS — I63511 Cerebral infarction due to unspecified occlusion or stenosis of right middle cerebral artery: Secondary | ICD-10-CM | POA: Diagnosis not present

## 2021-07-13 DIAGNOSIS — I69391 Dysphagia following cerebral infarction: Secondary | ICD-10-CM | POA: Diagnosis not present

## 2021-07-13 DIAGNOSIS — I952 Hypotension due to drugs: Secondary | ICD-10-CM | POA: Diagnosis not present

## 2021-07-13 DIAGNOSIS — L8990 Pressure ulcer of unspecified site, unspecified stage: Secondary | ICD-10-CM | POA: Diagnosis not present

## 2021-07-13 DIAGNOSIS — R350 Frequency of micturition: Secondary | ICD-10-CM | POA: Diagnosis not present

## 2021-07-14 ENCOUNTER — Encounter (HOSPITAL_COMMUNITY): Payer: Self-pay

## 2021-07-14 ENCOUNTER — Ambulatory Visit (HOSPITAL_COMMUNITY): Payer: Medicare Other

## 2021-07-14 ENCOUNTER — Encounter (HOSPITAL_COMMUNITY): Payer: Medicare Other

## 2021-07-14 ENCOUNTER — Other Ambulatory Visit: Payer: Self-pay

## 2021-07-14 DIAGNOSIS — R278 Other lack of coordination: Secondary | ICD-10-CM

## 2021-07-14 DIAGNOSIS — M25642 Stiffness of left hand, not elsewhere classified: Secondary | ICD-10-CM

## 2021-07-14 DIAGNOSIS — M6281 Muscle weakness (generalized): Secondary | ICD-10-CM | POA: Diagnosis not present

## 2021-07-14 DIAGNOSIS — M25622 Stiffness of left elbow, not elsewhere classified: Secondary | ICD-10-CM

## 2021-07-14 DIAGNOSIS — R29898 Other symptoms and signs involving the musculoskeletal system: Secondary | ICD-10-CM

## 2021-07-14 DIAGNOSIS — R262 Difficulty in walking, not elsewhere classified: Secondary | ICD-10-CM | POA: Diagnosis not present

## 2021-07-14 DIAGNOSIS — R2689 Other abnormalities of gait and mobility: Secondary | ICD-10-CM | POA: Diagnosis not present

## 2021-07-15 NOTE — Therapy (Signed)
[place], [place], [address] Phone: [phone]   Fax:  [phone]  Occupational Therapy Treatment  Patient Details  Name: Matthew Wise MRN: [id number] Date of Birth: 17-Jan-1961 Referring Provider (OT): Alysia Penna, MD   Encounter Date: 07/14/2021   OT End of Session - 07/15/21 0844     Visit Number 16    Number of Visits 24    Date for OT Re-Evaluation 08/06/21    Authorization Type [employer] medicare    Authorization Time Period 11/30/20-end of year. $30 copay. 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 two simple tubular adenomas removed. patient was advised to go to [facility] for colonoscopy but he did not keep appt.   IR CT HEAD LTD  02/02/2021   IR PERCUTANEOUS ART THROMBECTOMY/INFUSION INTRACRANIAL INC DIAG ANGIO  02/02/2021    RADIOLOGY WITH ANESTHESIA N/A 02/02/2021   Procedure: IR WITH ANESTHESIA;  Surgeon: Luanne Bras, MD;  Location: [facility];  Service: Radiology;  Laterality: N/A;   TEE WITHOUT CARDIOVERSION N/A 09/10/2020   Procedure: TRANSESOPHAGEAL ECHOCARDIOGRAM (TEE) WITH PROPOFOL;  Surgeon: Satira Sark, MD;  Location: [facility];  Service: Cardiovascular;  Laterality: N/A;    There were no vitals filed for this visit.   Subjective Assessment - 07/14/21 1455     Subjective  S: I've been trying to work on the arm.    Currently in Pain? No/denies                [facility] OT Assessment - 07/14/21 1455       Assessment   Medical Diagnosis S/P right MCA with left hemiparesis      Precautions   Precautions Fall    Precaution Comments left side hemiparesis, left side visual field deficits                      OT Treatments/Exercises (OP) - 07/14/21 1455       Exercises   Exercises Hand;Shoulder      Neurological Re-education Exercises   Other Exercises 1 Standing ROM Arc. With set-up, pt used index finger to hook ring and transfer from left side of arc to right side.    Grasp and Release Digit Abduction/Adduction  Other Grasp and Release Exercises  Standing with large pegboard elevated off table 5 inches, patient used his left hand to pull out 8 large pegs and then transfer and release them to the left of the board.                      OT Short Term Goals - 07/09/21 1343       OT SHORT TERM GOAL #1   Title Patient will be educated and independent with HEP completion in order to faciliate his progress in therapy and begin to use his LUE as a gross assist for 25% or more of daily tasks.    Time 6    Period Weeks    Target Date 06/25/21      OT SHORT TERM GOAL #2   Title Patient will demonstrate a 25% increase in A/ROM of the LUE (shoulder, elbow, wrist) as able while using his LUE as a gross assist when completing self care tasks such as dressing.    Time  6    Period Weeks    Status On-going      OT SHORT TERM GOAL #3   Title Patient will demonstrate increased fine motor coordination in his left hand by completing the 9 hole peg test with peg set-up if needed.    Baseline 8/8: Unable to complete even with ste up provided.    Time 6    Period Weeks    Status On-going      OT SHORT TERM GOAL #4   Title Patient will increase is left hand strength to 10# and his pinch strength to 5# in order to begin to use his left hand to stabilize objects when completing self feeding.    Baseline 8/8: grip strength: 5#, lateral pinch 2#, 3 point pinch: 0    Time 6    Period Weeks    Status On-going      OT SHORT TERM GOAL #5   Title Patient will completing bathing and dressing with Mod assist where needed while utilizing DME, Adaptive equipment, and/or compensatory techniques with improved sitting and standing balance.    Time 6    Period Weeks    Status Partially Met               OT Long Term Goals - 05/16/21 1415       OT LONG TERM GOAL #1   Title Patient will demonstrate improved LUE A/ROM by 50% or more while utilizing his LUE as his non dominant extremity to provide an active assist to complete tasks that require use of bilateral UE's.    Time 12    Period Weeks    Status On-going      OT LONG TERM GOAL #2   Title Patient will increase his LUE strength (shoulder, elbow, wrist) to 4/5 or better while demonstrating improved ability to complete bathing and dressing tasks.    Time 12    Period Weeks    Status On-going      OT LONG TERM GOAL #3   Title Patient will increase his fine motor coordination in his left hand by completing the 9 hole peg test in the standardized fashion under 1'.    Time 12    Period Weeks    Status On-going      OT LONG TERM GOAL #4   Title Patient will demonstrate improved left hand grip strength to 15# and pinch strength to 8# in order to  manipulate and hold onto items in his left hand without dropping.     Time 12    Period Weeks    Status On-going      OT LONG TERM GOAL #5   Title Patient will completing bathing and dressing with min assist where needed while utilizing DME, Adaptive equipment, and/or compensatory techniques with improved sitting and standing balance.    Status On-going                   Plan - 07/15/21 0845     Clinical Impression Statement A: Pt was able to demonstrate more composite finger extension during session although remains limited with lateral pinch due to weakness with thumb opposition. When completing ROM Arc, he was able to take rings from left to right side although unable to move rings back from right to left due to decreased hand strength.    Body Structure / Function / Physical Skills ADL;Decreased knowledge of use of DME;Strength;Tone;GMC;Balance;UE functional use;Vision;ROM;IADL;Sensation;Coordination;FMC    Plan P: Continue to work on grasp and release activities (squigz on table top). Waiting for hemisling to arrive and trial.    Consulted and Agree with Plan of Care Patient             Patient will benefit from skilled therapeutic intervention in order to improve the following deficits and impairments:   Body Structure / Function / Physical Skills: ADL, Decreased knowledge of use of DME, Strength, Tone, GMC, Balance, UE functional use, Vision, ROM, IADL, Sensation, Coordination, [facility]       Visit Diagnosis: Stiffness of left hand, not elsewhere classified  Other symptoms and signs involving the musculoskeletal system  Other lack of coordination  Stiffness of left elbow, not elsewhere classified    Problem List Patient Active Problem List   Diagnosis Date Noted   Urinary frequency    Dysphagia, post-stroke    Spastic hemiplegia affecting nondominant side (HCC)    Drug-induced hypotension    Pressure injury of skin 02/14/2021   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   Dyslipidemia    Hemiparesis affecting  left side as late effect of stroke ([place])    Middle cerebral artery embolism, right 02/03/2021   Status post surgery 02/02/2021   Stroke (cerebrum) ([place]) 02/02/2021   Hypoglycemia 01/25/2021   Thyroid nodule 01/25/2021   Hypothyroidism 01/25/2021   Near syncope 01/21/2021   H/O adenomatous polyp of colon 12/30/2020   Acute ischemic stroke ([place]) 11/28/2020   Essential hypertension 11/28/2020   Mixed hyperlipidemia 11/28/2020   Acute CVA (cerebrovascular accident) ([place]) 09/09/2020   Transient ischemic attack 09/07/2020   History of hypertension 09/07/2020   Elevated troponin 09/07/2020   Dizziness 09/07/2020   Numbness and tingling 09/07/2020   H/O medication noncompliance 09/07/2020   Medicare annual wellness visit, subsequent 12/05/2019   Nicotine dependence 12/05/2019   Chronic kidney disease, stage 3 unspecified ([place]) 10/24/2019   Prediabetes 10/24/2019   Edema of foot 09/14/2017   Tubular adenoma of colon 09/14/2017   Long term (current) use of anticoagulants 11/14/2014   Sickle cell trait ([place]) 08/29/2012   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 [place], [place], [address] Phone: [phone]   Fax:  [phone]  Name: Matthew Wise MRN: [id number] Date of Birth: Jul 21, 1961

## 2021-07-16 ENCOUNTER — Encounter (HOSPITAL_COMMUNITY): Payer: Self-pay

## 2021-07-16 ENCOUNTER — Encounter (HOSPITAL_COMMUNITY): Payer: Self-pay | Admitting: Physical Therapy

## 2021-07-16 ENCOUNTER — Other Ambulatory Visit: Payer: Self-pay

## 2021-07-16 ENCOUNTER — Ambulatory Visit (INDEPENDENT_AMBULATORY_CARE_PROVIDER_SITE_OTHER): Payer: Medicare Other | Admitting: Nurse Practitioner

## 2021-07-16 ENCOUNTER — Ambulatory Visit (HOSPITAL_COMMUNITY): Payer: Medicare Other

## 2021-07-16 ENCOUNTER — Ambulatory Visit (HOSPITAL_COMMUNITY): Payer: Medicare Other | Admitting: Physical Therapy

## 2021-07-16 DIAGNOSIS — M6281 Muscle weakness (generalized): Secondary | ICD-10-CM

## 2021-07-16 DIAGNOSIS — R29898 Other symptoms and signs involving the musculoskeletal system: Secondary | ICD-10-CM

## 2021-07-16 DIAGNOSIS — R2689 Other abnormalities of gait and mobility: Secondary | ICD-10-CM | POA: Diagnosis not present

## 2021-07-16 DIAGNOSIS — R278 Other lack of coordination: Secondary | ICD-10-CM | POA: Diagnosis not present

## 2021-07-16 DIAGNOSIS — M25642 Stiffness of left hand, not elsewhere classified: Secondary | ICD-10-CM | POA: Diagnosis not present

## 2021-07-16 DIAGNOSIS — M25622 Stiffness of left elbow, not elsewhere classified: Secondary | ICD-10-CM | POA: Diagnosis not present

## 2021-07-16 DIAGNOSIS — R262 Difficulty in walking, not elsewhere classified: Secondary | ICD-10-CM | POA: Diagnosis not present

## 2021-07-16 NOTE — Therapy (Signed)
[facility] [address] [place], [place], [address] Phone: [phone]   Fax:  [phone]  Physical Therapy Treatment  Patient Details  Name: Matthew Wise MRN: [id number] Date of Birth: [birth year] Referring Provider (PT): Alysia Penna MD  Progress Note Reporting Period 06/25/21 to 07/16/21  See note below for Objective Data and Assessment of Progress/Goals.      Encounter Date: 07/16/2021   PT End of Session - 07/16/21 1344     Visit Number 15    Number of Visits 23    Date for PT Re-Evaluation 08/13/21    Authorization Type [employer]    Progress Note Due on Visit 23    PT Start Time 1342    PT Stop Time 1428    PT Time Calculation (min) 46 min    Equipment Utilized During Treatment Gait belt   WC near   Activity Tolerance Patient tolerated treatment well;Patient limited by fatigue    Behavior During Therapy WFL for tasks assessed/performed             Past Medical History:  Diagnosis Date   Acute CVA (cerebrovascular accident) ([place]) 09/09/2020   Chronic anticoagulation 11/14/2014   CKD (chronic kidney disease) stage 3, GFR 30-59 ml/min ([place]) 10/24/2019   CKD (chronic kidney disease) stage 3, GFR 30-59 ml/min ([place]) 10/24/2019   Clotting disorder ([place])    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 two simple tubular adenomas removed. patient was advised to go to [facility] for colonoscopy but he  did not keep appt.   IR CT HEAD LTD  02/02/2021   IR PERCUTANEOUS ART THROMBECTOMY/INFUSION INTRACRANIAL INC DIAG ANGIO  02/02/2021   RADIOLOGY WITH ANESTHESIA N/A 02/02/2021   Procedure: IR WITH ANESTHESIA;  Surgeon: Luanne Bras, MD;  Location: [facility];  Service: Radiology;  Laterality: N/A;   TEE WITHOUT CARDIOVERSION N/A 09/10/2020   Procedure: TRANSESOPHAGEAL ECHOCARDIOGRAM (TEE) WITH PROPOFOL;  Surgeon: Satira Sark, MD;  Location: [facility];  Service: Cardiovascular;  Laterality: N/A;    There were no vitals filed for this visit.   Subjective Assessment - 07/16/21 1343     Subjective No new issues. Practicing walking at home with walker.    Pertinent History CVA 02/02/21    Limitations Walking;Standing;House hold activities    Currently in Pain? No/denies                [facility] PT Assessment - 07/16/21 0001       Assessment   Medical Diagnosis S/P right MCA with left hemiparesis    Referring Provider (PT) Alysia Penna MD      Precautions   Precautions Fall      Balance Screen   Has the patient fallen in the past 6 months No      Home Environment   Living Environment Private residence    Living Arrangements Other relatives    Available Help at Discharge Family  Prior Function   Level of Independence Independent      Cognition   Overall Cognitive Status Within Functional Limits for tasks assessed      Strength   Right Hip Flexion 4+/5    Left Hip Flexion 3-/5    Right Knee Extension 4+/5    Left Knee Extension 3/5    Right Ankle Dorsiflexion 5/5    Left Ankle Dorsiflexion 1/5      Transfers   Transfers Sit to [facility];Sit to Supine;Supine to Sit    Sit to Stand 6: Modified independent (Device/Increase time)    Stand to Sit 6: Modified independent (Device/Increase time)    Stand Pivot Transfers 5: Supervision    Supine to Sit 6: Modified independent (Device/Increase time)    Sit to Supine 4: Min assist      Ambulation/Gait    Ambulation/Gait Yes    Ambulation/Gait Assistance 4: Min guard    Ambulation Distance (Feet) 115 Feet    Assistive device Hemi-walker    Gait Pattern Step-to pattern;Decreased stride length;Left foot flat;Decreased dorsiflexion - left;Decreased stance time - left;Decreased step length - right;Poor foot clearance - left    Ambulation Surface Level;Indoor                           [facility] Adult PT Treatment/Exercise - 07/16/21 0001       Knee/Hip Exercises: Aerobic   Nustep 5 min Lv 1 for mobility and sequencing      Knee/Hip Exercises: Supine   Heel Slides AAROM;Left;10 reps                      PT Short Term Goals - 06/25/21 1638       PT SHORT TERM GOAL #1   Title Patient will be independent with initial HEP and self-management strategies to improve functional outcomes    Time 4    Period Weeks    Status On-going    Target Date 06/19/21      PT SHORT TERM GOAL #2   Title Patient will be Min A with transfers and bed mobility    Time 4    Period Weeks    Status Achieved    Target Date 06/19/21               PT Long Term Goals - 07/16/21 1725       PT LONG TERM GOAL #1   Title Patient will be Mod I with bed mobility and transfers    Baseline Mod I with all except sit to supine. Does require multiple attempts on sit to stand    Time 8    Period Weeks    Status Partially Met      PT LONG TERM GOAL #2   Title Patient will be able to ambulate >100 feet with LRAD for improved household mobility    Baseline Current 115 feet with HW    Time 8    Period Weeks    Status Achieved      PT LONG TERM GOAL #3   Title Patient will have at least 4/5 MMT throughout LLE (except ankle DF) for improved functional mobility and stair/ step navigation.    Baseline See MMT    Time 8    Period Weeks    Status On-going                   Plan - 07/16/21  1720     Clinical Impression Statement Patient showing good progress toward therapy  goals. Improved ambulatory distance, though with notable deficits and increased fatigue. Continued difficulty with LLE coordination though improved in NWB position. Improved MMT and activity throughout LLE except ankle DF. May request referral for AFO to improve gait and functional mobility. At this time patient is progressing well and would continue to benefit from skilled therapy services to address remaining deficits and continue gait training for improved functional mobility and ADLs.    Examination-Activity Limitations Caring for Others;Transfers;Locomotion Level;Toileting;Stand    Examination-Participation Restrictions Yard Work;Cleaning;Laundry;Community Activity    Stability/Clinical Decision Making Evolving/Moderate complexity    Rehab Potential Fair    PT Frequency 2x / week    PT Duration 4 weeks    PT Treatment/Interventions ADLs/Self Care Home Management;Aquatic Therapy;Biofeedback;Cryotherapy;Fluidtherapy;Contrast Bath;Electrical Stimulation;Therapeutic exercise;Orthotic Fit/Training;Patient/family education;Manual lymph drainage;Manual techniques;Dry needling;Energy conservation;Spinal Manipulations;Joint Manipulations;Splinting;Taping;Compression bandaging;Vasopneumatic Device;Therapeutic activities;Parrafin;Ultrasound;Traction;Functional mobility training;Neuromuscular re-education;Stair training;Gait training;Moist Heat;Iontophoresis 40m/ml Dexamethasone;DME Instruction;Balance training;Passive range of motion;Scar mobilization;Wheelchair mobility training;Visual/perceptual remediation/compensation    PT Next Visit Plan Progress functional mobility as tolerated. Seated and mat strengthening, encourage WB through LT side. Standing balance> gait. Body-weight support harnass?    PT Home Exercise Plan Eval: marches, LAQs, heel/toe raise; 7/1:  bridge and sidelying clam 7/12 heel slide, supine hip abduction; tall sitting with lumbar support    Consulted and Agree with Plan of Care Patient              Patient will benefit from skilled therapeutic intervention in order to improve the following deficits and impairments:  Abnormal gait, Improper body mechanics, Impaired sensation, Decreased balance, Difficulty walking, Decreased activity tolerance, Decreased range of motion, Decreased strength, Hypomobility, Impaired tone, Decreased mobility, Decreased coordination  Visit Diagnosis: Muscle weakness (generalized)  Difficulty in walking, not elsewhere classified  Other abnormalities of gait and mobility  Other symptoms and signs involving the musculoskeletal system     Problem List Patient Active Problem List   Diagnosis Date Noted   Urinary frequency    Dysphagia, post-stroke    Spastic hemiplegia affecting nondominant side (HCC)    Drug-induced hypotension    Pressure injury of skin 02/14/2021   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   Dyslipidemia    Hemiparesis affecting left side as late effect of stroke ([place]    Middle cerebral artery embolism, right 02/03/2021   Status post surgery 02/02/2021   Stroke (cerebrum) ([place] 02/02/2021   Hypoglycemia 01/25/2021   Thyroid nodule 01/25/2021   Hypothyroidism 01/25/2021   Near syncope 01/21/2021   H/O adenomatous polyp of colon 12/30/2020   Acute ischemic stroke ([place] 11/28/2020   Essential hypertension 11/28/2020   Mixed hyperlipidemia 11/28/2020   Acute CVA (cerebrovascular accident) ([place] 09/09/2020   Transient ischemic attack 09/07/2020   History of hypertension 09/07/2020   Elevated troponin 09/07/2020   Dizziness 09/07/2020   Numbness and tingling 09/07/2020   H/O medication noncompliance 09/07/2020   Medicare annual wellness visit, subsequent 12/05/2019   Nicotine dependence 12/05/2019   Chronic kidney disease, stage 3 unspecified ([place] 10/24/2019   Prediabetes 10/24/2019   Edema of foot 09/14/2017   Tubular adenoma of colon 09/14/2017   Long term (current) use of anticoagulants  11/14/2014   Sickle cell trait ([place] 08/29/2012   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

## 2021-07-17 NOTE — Therapy (Signed)
[facility] [place], [place], [address] Phone: [phone]   Fax:  [phone]  Occupational Therapy Treatment  Patient Details  Name: Matthew Wise MRN: [id number] Date of Birth: November 22, 1961 Referring Provider (OT): Alysia Penna, MD   Encounter Date: 07/16/2021   OT End of Session - 07/17/21 1535     Visit Number 17    Number of Visits 24    Date for OT Re-Evaluation 08/06/21    Authorization Type UHC medicare    Authorization Time Period 11/30/20-end of year. $30 copay. 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 two simple tubular adenomas removed. patient was [facility] but he did not keep appt.   IR CT HEAD LTD  02/02/2021   IR PERCUTANEOUS ART THROMBECTOMY/INFUSION INTRACRANIAL INC DIAG ANGIO  02/02/2021    RADIOLOGY WITH ANESTHESIA N/A 02/02/2021   Procedure: IR WITH ANESTHESIA;  Surgeon: Luanne Bras, MD;  Location: [facility];  Service: Radiology;  Laterality: N/A;   TEE WITHOUT CARDIOVERSION N/A 09/10/2020   Procedure: TRANSESOPHAGEAL ECHOCARDIOGRAM (TEE) WITH PROPOFOL;  Surgeon: Satira Sark, MD;  Location: [facility];  Service: Cardiovascular;  Laterality: N/A;    There were no vitals filed for this visit.   Subjective Assessment - 07/17/21 1330     Subjective  S: My sister hasn't been able to get ahold of her, I think. (when asking about contacting [employer] [facility])    Currently in Pain? No/denies                [facility] OT Assessment - 07/17/21 1331       Assessment   Medical Diagnosis S/P right MCA with left hemiparesis      Precautions   Precautions Fall    Precaution Comments left side hemiparesis, left side visual field deficits                      OT Treatments/Exercises (OP) - 07/17/21 0001       Exercises   Exercises Hand;Shoulder;Elbow      Shoulder Exercises: Seated   Protraction PROM;10 reps      Elbow Exercises   Other elbow exercises Passive ROM elbow flexion/ extension 10X  seated      Neurological Re-education Exercises   Other Exercises 1 Completed sitting grasp and release task initially using Squigz. Pt reached into shoulder flexion with elbow extension to left side while actively reaching to the left just outside of shoulder working on finger extension then flexion to grasp Squigz in order to move them to the right side of table to release.Completed task standing as well to increase functional movement of shoulder.                      OT Short Term Goals - 07/09/21 1343       OT SHORT TERM GOAL #1   Title Patient will be educated and independent with HEP completion in order to faciliate his progress in therapy and begin to use his LUE as a gross assist for 25% or more of daily tasks.    Time 6    Period Weeks     Target Date 06/25/21      OT SHORT TERM GOAL #2   Title Patient will demonstrate a 25% increase in A/ROM of the LUE (shoulder, elbow, wrist) as able while using his LUE as a gross assist when completing self care tasks such as dressing.    Time 6    Period Weeks    Status On-going      OT SHORT TERM GOAL #3   Title Patient will demonstrate increased fine motor coordination in his left hand by completing the 9 hole peg test with peg set-up if needed.    Baseline 8/8: Unable to complete even with ste up provided.    Time 6    Period Weeks    Status On-going      OT SHORT TERM GOAL #4   Title Patient will increase is left hand strength to 10# and his pinch strength to 5# in order to begin to use his left hand to stabilize objects when completing self feeding.    Baseline 8/8: grip strength: 5#, lateral pinch 2#, 3 point pinch: 0    Time 6    Period Weeks    Status On-going      OT SHORT TERM GOAL #5   Title Patient will completing bathing and dressing with Mod assist where needed while utilizing DME, Adaptive equipment, and/or compensatory techniques with improved sitting and standing balance.    Time 6    Period Weeks    Status Partially Met               OT Long Term Goals - 05/16/21 1415       OT LONG TERM GOAL #1   Title Patient will demonstrate improved LUE A/ROM by 50% or more while utilizing his LUE as his non dominant extremity to provide an active assist to complete tasks that require use of bilateral UE's.    Time 12    Period Weeks    Status On-going      OT LONG TERM GOAL #2   Title Patient will increase his LUE strength (shoulder, elbow, wrist) to 4/5 or better while demonstrating improved ability to complete bathing and dressing tasks.    Time 12    Period Weeks    Status On-going      OT LONG TERM GOAL #3   Title Patient will increase his fine motor coordination in his left hand by completing the 9 hole peg test in the standardized fashion under 1'.     Time 12  Period Weeks    Status On-going      OT LONG TERM GOAL #4   Title Patient will demonstrate improved left hand grip strength to 15# and pinch strength to 8# in order to manipulate and hold onto items in his left hand without dropping.    Time 12    Period Weeks    Status On-going      OT LONG TERM GOAL #5   Title Patient will completing bathing and dressing with min assist where needed while utilizing DME, Adaptive equipment, and/or compensatory techniques with improved sitting and standing balance.    Status On-going                   Plan - 07/17/21 1536     Clinical Impression Statement A: Patient demonstrated increased tone in his left shoulder and elbow when attempting to actively and passive move LUE. Provided intermittent passive ROM to left arm during grasp and release activity. Pt was unable to demonstrate adequate amount of UE strength in order to push Squigz into table. Patient missed his last follow up appointment with Dr. Dianna Limbo due to transportation issue and was not able to reschedule until Sept. Suggested calling to inquire about a sooner date if possible.    Body Structure / Function / Physical Skills ADL;Decreased knowledge of use of DME;Strength;Tone;GMC;Balance;UE functional use;Vision;ROM;IADL;Sensation;Coordination;FMC    Plan P: Hemisling has arrived. Trial use for standing/walking tasks with PT. Continue with grasp and release tasks.    Consulted and Agree with Plan of Care Patient             Patient will benefit from skilled therapeutic intervention in order to improve the following deficits and impairments:   Body Structure / Function / Physical Skills: ADL, Decreased knowledge of use of DME, Strength, Tone, GMC, Balance, UE functional use, Vision, ROM, IADL, Sensation, Coordination, [facility]       Visit Diagnosis: Other symptoms and signs involving the musculoskeletal system  Stiffness of left hand, not elsewhere classified  Other  lack of coordination  Stiffness of left elbow, not elsewhere classified    Problem List Patient Active Problem List   Diagnosis Date Noted   Urinary frequency    Dysphagia, post-stroke    Spastic hemiplegia affecting nondominant side (HCC)    Drug-induced hypotension    Pressure injury of skin 02/14/2021   Right middle cerebral artery stroke ([place]) 02/12/2021   Dyslipidemia    Hemiparesis affecting left side as late effect of stroke ([place])    Middle cerebral artery embolism, right 02/03/2021   Status post surgery 02/02/2021   Stroke (cerebrum) ([place]) 02/02/2021   Hypoglycemia 01/25/2021   Thyroid nodule 01/25/2021   Hypothyroidism 01/25/2021   Near syncope 01/21/2021   H/O adenomatous polyp of colon 12/30/2020   Acute ischemic stroke ([place]) 11/28/2020   Essential hypertension 11/28/2020   Mixed hyperlipidemia 11/28/2020   Acute CVA (cerebrovascular accident) ([place]) 09/09/2020   Transient ischemic attack 09/07/2020   History of hypertension 09/07/2020   Elevated troponin 09/07/2020   Dizziness 09/07/2020   Numbness and tingling 09/07/2020   H/O medication noncompliance 09/07/2020   Medicare annual wellness visit, subsequent 12/05/2019   Nicotine dependence 12/05/2019   Chronic kidney disease, stage 3 unspecified ([place]) 10/24/2019   Prediabetes 10/24/2019   Edema of foot 09/14/2017   Tubular adenoma of colon 09/14/2017   Long term (current) use of anticoagulants 11/14/2014   Sickle cell trait ([place]) 08/29/2012   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

## 2021-07-21 ENCOUNTER — Ambulatory Visit (HOSPITAL_COMMUNITY): Payer: Medicare Other

## 2021-07-21 ENCOUNTER — Encounter (HOSPITAL_COMMUNITY): Payer: Medicare Other

## 2021-07-23 ENCOUNTER — Ambulatory Visit (HOSPITAL_COMMUNITY): Payer: Medicare Other

## 2021-07-23 ENCOUNTER — Other Ambulatory Visit: Payer: Self-pay

## 2021-07-23 ENCOUNTER — Encounter (HOSPITAL_COMMUNITY): Payer: Self-pay

## 2021-07-23 DIAGNOSIS — M25622 Stiffness of left elbow, not elsewhere classified: Secondary | ICD-10-CM | POA: Diagnosis not present

## 2021-07-23 DIAGNOSIS — R262 Difficulty in walking, not elsewhere classified: Secondary | ICD-10-CM | POA: Diagnosis not present

## 2021-07-23 DIAGNOSIS — R29898 Other symptoms and signs involving the musculoskeletal system: Secondary | ICD-10-CM | POA: Diagnosis not present

## 2021-07-23 DIAGNOSIS — R2689 Other abnormalities of gait and mobility: Secondary | ICD-10-CM | POA: Diagnosis not present

## 2021-07-23 DIAGNOSIS — M25642 Stiffness of left hand, not elsewhere classified: Secondary | ICD-10-CM

## 2021-07-23 DIAGNOSIS — R278 Other lack of coordination: Secondary | ICD-10-CM

## 2021-07-23 DIAGNOSIS — M6281 Muscle weakness (generalized): Secondary | ICD-10-CM

## 2021-07-23 NOTE — Therapy (Signed)
[facility] [address] [place], [place], [address] Phone: [phone]   Fax:  [phone]  Physical Therapy Treatment  Patient Details  Name: Matthew Wise MRN: [id number] Date of Birth: [birth year] Referring Provider (PT): Alysia Penna MD   Encounter Date: 07/23/2021   PT End of Session - 07/23/21 1341     Visit Number 16    Number of Visits 23    Date for PT Re-Evaluation 08/13/21    Authorization Type UHC MEdic[employer]    Progress Note Due on Visit 23    PT Start Time 1315    PT Stop Time 1400    PT Time Calculation (min) 45 min    Equipment Utilized During Treatment Gait belt   WC behind   Activity Tolerance Patient tolerated treatment well;Patient limited by fatigue    Behavior During Therapy [facility] for tasks assessed/performed             Past Medical History:  Diagnosis Date   Acute CVA (cerebrovascular accident) ([place]) 09/09/2020   Chronic anticoagulation 11/14/2014   CKD (chronic kidney disease) stage 3, GFR 30-59 ml/min ([place]) 10/24/2019   CKD (chronic kidney disease) stage 3, GFR 30-59 ml/min ([place]) 10/24/2019   Clotting disorder ([place])    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 two simple tubular adenomas removed. patient was advised to go to [facility] for colonoscopy but he did not keep appt.   IR CT HEAD LTD  02/02/2021   IR PERCUTANEOUS ART THROMBECTOMY/INFUSION INTRACRANIAL INC DIAG ANGIO   02/02/2021   RADIOLOGY WITH ANESTHESIA N/A 02/02/2021   Procedure: IR WITH ANESTHESIA;  Surgeon: Luanne Bras, MD;  Location: [facility];  Service: Radiology;  Laterality: N/A;   TEE WITHOUT CARDIOVERSION N/A 09/10/2020   Procedure: TRANSESOPHAGEAL ECHOCARDIOGRAM (TEE) WITH PROPOFOL;  Surgeon: Satira Sark, MD;  Location: [facility];  Service: Cardiovascular;  Laterality: N/A;    There were no vitals filed for this visit.   Subjective Assessment - 07/23/21 1321     Subjective No reports of pain or recent issues.  Has been walking some at home with sister behind wiht wheelchair.    Pertinent History CVA 02/02/21    Patient Stated Goals Be able to get up and go without walker    Currently in Pain? No/denies                [facility] PT Assessment - 07/23/21 0001       Assessment   Medical Diagnosis S/P right MCA with left hemiparesis    Referring Provider (PT) Alysia Penna MD    Prior Therapy Yes      Precautions   Precautions Fall    Precaution Comments left side hemiparesis, left side visual field deficits                           [facility] Adult PT Treatment/Exercise - 07/23/21 0001  Ambulation/Gait   Ambulation/Gait Yes    Ambulation/Gait Assistance 4: Min guard    Ambulation Distance (Feet) 120 Feet   2 setx 60 ft   Assistive device Hemi-walker    Gait Pattern Step-to pattern;Decreased stride length;Left foot flat;Decreased dorsiflexion - left;Decreased stance time - left;Decreased step length - right;Poor foot clearance - left    Ambulation Surface Level;Indoor    Gait Comments Cueing for sequence and foot placement      Posture/Postural Control   Posture/Postural Control Postural limitations    Postural Limitations Rounded Shoulders;Forward head;Increased lumbar lordosis;Posterior pelvic tilt;Flexed trunk;Weight shift right      Knee/Hip Exercises: Standing   Other Standing Knee Exercises standing tolerance 3x 1' without HHA (did require  intermittent HHA); attempted Rt LE on step, Lt LE unable to support    Other Standing Knee Exercises LLE step taps on 4 inch box with HHA x 1, 2 x 10 with 2lb ankle weight      Knee/Hip Exercises: Seated   Long Arc Quad 2 sets;10 reps    Sit to [employer] 5 reps;with UE support   Rt HHA                     PT Short Term Goals - 06/25/21 1638       PT SHORT TERM GOAL #1   Title Patient will be independent with initial HEP and self-management strategies to improve functional outcomes    Time 4    Period Weeks    Status On-going    Target Date 06/19/21      PT SHORT TERM GOAL #2   Title Patient will be Min A with transfers and bed mobility    Time 4    Period Weeks    Status Achieved    Target Date 06/19/21               PT Long Term Goals - 07/16/21 1725       PT LONG TERM GOAL #1   Title Patient will be Mod I with bed mobility and transfers    Baseline Mod I with all except sit to supine. Does require multiple attempts on sit to stand    Time 8    Period Weeks    Status Partially Met      PT LONG TERM GOAL #2   Title Patient will be able to ambulate >100 feet with LRAD for improved household mobility    Baseline Current 115 feet with HW    Time 8    Period Weeks    Status Achieved      PT LONG TERM GOAL #3   Title Patient will have at least 4/5 MMT throughout LLE (except ankle DF) for improved functional mobility and stair/ step navigation.    Baseline See MMT    Time 8    Period Weeks    Status On-going                   Plan - 07/23/21 1403     Clinical Impression Statement Session focus with static balance and gait training.  OT received sling to address Lt UE subluxation that was used during gait training wiht hemiwalker.  Pt required cueing for sequence wiht hemiwalker and posture.  Difficulty clearing Lt foot that increases risk of fall.  Sent referral to MD for AFO to address to foot drop.    Examination-Activity Limitations Caring  for Others;Transfers;Locomotion Level;Toileting;Stand    Examination-Participation Restrictions  Yard Work;Cleaning;Laundry;Community Activity    Stability/Clinical Decision Making Evolving/Moderate complexity    Clinical Decision Making Moderate    Rehab Potential Fair    PT Frequency 2x / week    PT Duration 4 weeks    PT Treatment/Interventions ADLs/Self Care Home Management;Aquatic Therapy;Biofeedback;Cryotherapy;Fluidtherapy;Contrast Bath;Electrical Stimulation;Therapeutic exercise;Orthotic Fit/Training;Patient/family education;Manual lymph drainage;Manual techniques;Dry needling;Energy conservation;Spinal Manipulations;Joint Manipulations;Splinting;Taping;Compression bandaging;Vasopneumatic Device;Therapeutic activities;Parrafin;Ultrasound;Traction;Functional mobility training;Neuromuscular re-education;Stair training;Gait training;Moist Heat;Iontophoresis 20m/ml Dexamethasone;DME Instruction;Balance training;Passive range of motion;Scar mobilization;Wheelchair mobility training;Visual/perceptual remediation/compensation    PT Next Visit Plan Progress functional mobility as tolerated. Seated and mat strengthening, encourage WB through LT side. Standing balance> gait. Body-weight support harnass?    PT Home Exercise Plan Eval: marches, LAQs, heel/toe raise; 7/1:  bridge and sidelying clam 7/12 heel slide, supine hip abduction; tall sitting with lumbar support    Consulted and Agree with Plan of Care Patient             Patient will benefit from skilled therapeutic intervention in order to improve the following deficits and impairments:  Abnormal gait, Improper body mechanics, Impaired sensation, Decreased balance, Difficulty walking, Decreased activity tolerance, Decreased range of motion, Decreased strength, Hypomobility, Impaired tone, Decreased mobility, Decreased coordination  Visit Diagnosis: Muscle weakness (generalized)  Difficulty in walking, not elsewhere classified  Other  abnormalities of gait and mobility  Other symptoms and signs involving the musculoskeletal system     Problem List Patient Active Problem List   Diagnosis Date Noted   Urinary frequency    Dysphagia, post-stroke    Spastic hemiplegia affecting nondominant side (HCC)    Drug-induced hypotension    Pressure injury of skin 02/14/2021   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   Dyslipidemia    Hemiparesis affecting left side as late effect of stroke ([place]    Middle cerebral artery embolism, right 02/03/2021   Status post surgery 02/02/2021   Stroke (cerebrum) ([place] 02/02/2021   Hypoglycemia 01/25/2021   Thyroid nodule 01/25/2021   Hypothyroidism 01/25/2021   Near syncope 01/21/2021   H/O adenomatous polyp of colon 12/30/2020   Acute ischemic stroke ([place] 11/28/2020   Essential hypertension 11/28/2020   Mixed hyperlipidemia 11/28/2020   Acute CVA (cerebrovascular accident) ([place] 09/09/2020   Transient ischemic attack 09/07/2020   History of hypertension 09/07/2020   Elevated troponin 09/07/2020   Dizziness 09/07/2020   Numbness and tingling 09/07/2020   H/O medication noncompliance 09/07/2020   Medicare annual wellness visit, subsequent 12/05/2019   Nicotine dependence 12/05/2019   Chronic kidney disease, stage 3 unspecified ([place] 10/24/2019   Prediabetes 10/24/2019   Edema of foot 09/14/2017   Tubular adenoma of colon 09/14/2017   Long term (current) use of anticoagulants 11/14/2014   Sickle cell trait ([place] 08/29/2012   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

## 2021-07-24 ENCOUNTER — Telehealth (HOSPITAL_COMMUNITY): Payer: Self-pay | Admitting: Physical Therapy

## 2021-07-24 NOTE — Therapy (Signed)
[facility] [place], [place], [address] Phone: [phone]   Fax:  [phone]  Occupational Therapy Treatment  Patient Details  Name: Matthew Wise MRN: [id number] Date of Birth: Oct 28, 1961 Referring Provider (OT): Alysia Penna, MD   Encounter Date: 07/23/2021   OT End of Session - 07/24/21 1551     Visit Number 18    Number of Visits 24    Date for OT Re-Evaluation 08/06/21    Authorization Type UHC medicare    Authorization Time Period 11/30/20-end of year. $30 copay. 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 two simple tubular adenomas removed. patient was advised to go to [facility] for colonoscopy but he did not keep appt.   IR CT HEAD LTD  02/02/2021   IR PERCUTANEOUS ART THROMBECTOMY/INFUSION INTRACRANIAL INC DIAG ANGIO  02/02/2021    RADIOLOGY WITH ANESTHESIA N/A 02/02/2021   Procedure: IR WITH ANESTHESIA;  Surgeon: Luanne Bras, MD;  Location: [facility];  Service: Radiology;  Laterality: N/A;   TEE WITHOUT CARDIOVERSION N/A 09/10/2020   Procedure: TRANSESOPHAGEAL ECHOCARDIOGRAM (TEE) WITH PROPOFOL;  Surgeon: Satira Sark, MD;  [facility];  Service: Cardiovascular;  Laterality: N/A;    There were no vitals filed for this visit.   Subjective Assessment - 07/23/21 1418     Subjective  S: Nothing new to report.    Currently in Pain? No/denies                          OT Treatments/Exercises (OP) - 07/23/21 1421       Exercises   Exercises Hand;Shoulder;Elbow      Neurological Re-education Exercises   Other Exercises 1 Completed grasp and release task seated then standing. Utilized colored cubes with worksheet. Splint sockette placed on left hand/forearm to help decrease friction on skin with table. Provided occassional physical (active assist) with verbal and visual cues for form and technique.    Grasp and Release Digit Abduction/Adduction    Other Grasp and Release Exercises  Standing; large pegboard place on table, 5 out 6 large pegs removed from pegboard using left UE.  OT Education - 07/24/21 1550     Education Details During PT session prior to OT: Donned Mohr sling for use only in clinic. Focus: decrease shoulder subluxation while providing stable shoulder position while ambulating allow for greater body posture.    Person(s) Educated Patient    Methods Explanation    Comprehension Verbalized understanding              OT Short Term Goals - 07/09/21 1343       OT SHORT TERM GOAL #1   Title Patient will be educated and independent with HEP completion in order to faciliate his progress in therapy and begin to use his LUE as a gross assist for 25% or more of daily tasks.    Time 6    Period Weeks    Target Date 06/25/21      OT SHORT TERM  GOAL #2   Title Patient will demonstrate a 25% increase in A/ROM of the LUE (shoulder, elbow, wrist) as able while using his LUE as a gross assist when completing self care tasks such as dressing.    Time 6    Period Weeks    Status On-going      OT SHORT TERM GOAL #3   Title Patient will demonstrate increased fine motor coordination in his left hand by completing the 9 hole peg test with peg set-up if needed.    Baseline 8/8: Unable to complete even with ste up provided.    Time 6    Period Weeks    Status On-going      OT SHORT TERM GOAL #4   Title Patient will increase is left hand strength to 10# and his pinch strength to 5# in order to begin to use his left hand to stabilize objects when completing self feeding.    Baseline 8/8: grip strength: 5#, lateral pinch 2#, 3 point pinch: 0    Time 6    Period Weeks    Status On-going      OT SHORT TERM GOAL #5   Title Patient will completing bathing and dressing with Mod assist where needed while utilizing DME, Adaptive equipment, and/or compensatory techniques with improved sitting and standing balance.    Time 6    Period Weeks    Status Partially Met               OT Long Term Goals - 05/16/21 1415       OT LONG TERM GOAL #1   Title Patient will demonstrate improved LUE A/ROM by 50% or more while utilizing his LUE as his non dominant extremity to provide an active assist to complete tasks that require use of bilateral UE's.    Time 12    Period Weeks    Status On-going      OT LONG TERM GOAL #2   Title Patient will increase his LUE strength (shoulder, elbow, wrist) to 4/5 or better while demonstrating improved ability to complete bathing and dressing tasks.    Time 12    Period Weeks    Status On-going      OT LONG TERM GOAL #3   Title Patient will increase his fine motor coordination in his left hand by completing the 9 hole peg test in the standardized fashion under 1'.    Time 12    Period Weeks    Status  On-going      OT LONG TERM GOAL #4   Title Patient will demonstrate improved left  hand grip strength to 15# and pinch strength to 8# in order to manipulate and hold onto items in his left hand without dropping.    Time 12    Period Weeks    Status On-going      OT LONG TERM GOAL #5   Title Patient will completing bathing and dressing with min assist where needed while utilizing DME, Adaptive equipment, and/or compensatory techniques with improved sitting and standing balance.    Status On-going                   Plan - 07/24/21 1602     Clinical Impression Statement A: Patient is able to demonstrate functional composite finger extension 1-2 times initially then he is unable to continue with activating finger muscles. Rest breaks were encouraged and taken throughout session. Continues to have decreased left side awareness intermittently which is seen more when transitioning from sit to stand or stand to sit.    Body Structure / Function / Physical Skills ADL;Decreased knowledge of use of DME;Strength;Tone;GMC;Balance;UE functional use;Vision;ROM;IADL;Sensation;Coordination;FMC    Plan P: Continue with grasp and release while working on increasing available ROM in the left arm while providing active assist. Weightbearing into forearm while seated - reaching task.    Consulted and Agree with Plan of Care Patient             Patient will benefit from skilled therapeutic intervention in order to improve the following deficits and impairments:   Body Structure / Function / Physical Skills: ADL, Decreased knowledge of use of DME, Strength, Tone, GMC, Balance, UE functional use, Vision, ROM, IADL, Sensation, Coordination, [facility]       Visit Diagnosis: Stiffness of left hand, not elsewhere classified  Other symptoms and signs involving the musculoskeletal system  Other lack of coordination  Stiffness of left elbow, not elsewhere classified    Problem List Patient Active  Problem List   Diagnosis Date Noted   Urinary frequency    Dysphagia, post-stroke    Spastic hemiplegia affecting nondominant side (HCC)    Drug-induced hypotension    Pressure injury of skin 02/14/2021   Right middle cerebral artery stroke ([place]) 02/12/2021   Dyslipidemia    Hemiparesis affecting left side as late effect of stroke ([place])    Middle cerebral artery embolism, right 02/03/2021   Status post surgery 02/02/2021   Stroke (cerebrum) ([place]) 02/02/2021   Hypoglycemia 01/25/2021   Thyroid nodule 01/25/2021   Hypothyroidism 01/25/2021   Near syncope 01/21/2021   H/O adenomatous polyp of colon 12/30/2020   Acute ischemic stroke ([place]) 11/28/2020   Essential hypertension 11/28/2020   Mixed hyperlipidemia 11/28/2020   Acute CVA (cerebrovascular accident) ([place]) 09/09/2020   Transient ischemic attack 09/07/2020   History of hypertension 09/07/2020   Elevated troponin 09/07/2020   Dizziness 09/07/2020   Numbness and tingling 09/07/2020   H/O medication noncompliance 09/07/2020   Medicare annual wellness visit, subsequent 12/05/2019   Nicotine dependence 12/05/2019   Chronic kidney disease, stage 3 unspecified ([place]) 10/24/2019   Prediabetes 10/24/2019   Edema of foot 09/14/2017   Tubular adenoma of colon 09/14/2017   Long term (current) use of anticoagulants 11/14/2014   Sickle cell trait ([place]) 08/29/2012   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

## 2021-07-24 NOTE — Telephone Encounter (Signed)
Called patient about upcoming appointment on Monday 8/29 to notify them of schedule change. Original appointment was PT at 145 and OT at 230. Changed times to PT at 100 and OT at 145 secondary to physical therapist being out of office at 145 time slot.   8:57 AM, 07/24/21 Jerene Pitch, DPT Physical Therapy with [facility]  [phone] office

## 2021-07-28 ENCOUNTER — Encounter (HOSPITAL_COMMUNITY): Payer: Self-pay

## 2021-07-28 ENCOUNTER — Ambulatory Visit (HOSPITAL_COMMUNITY): Payer: Medicare Other | Admitting: Physical Therapy

## 2021-07-28 ENCOUNTER — Other Ambulatory Visit: Payer: Self-pay

## 2021-07-28 ENCOUNTER — Ambulatory Visit (HOSPITAL_COMMUNITY): Payer: Medicare Other

## 2021-07-28 ENCOUNTER — Encounter (HOSPITAL_COMMUNITY): Payer: Self-pay | Admitting: Physical Therapy

## 2021-07-28 DIAGNOSIS — M25622 Stiffness of left elbow, not elsewhere classified: Secondary | ICD-10-CM

## 2021-07-28 DIAGNOSIS — R2689 Other abnormalities of gait and mobility: Secondary | ICD-10-CM

## 2021-07-28 DIAGNOSIS — R262 Difficulty in walking, not elsewhere classified: Secondary | ICD-10-CM | POA: Diagnosis not present

## 2021-07-28 DIAGNOSIS — M6281 Muscle weakness (generalized): Secondary | ICD-10-CM

## 2021-07-28 DIAGNOSIS — R29898 Other symptoms and signs involving the musculoskeletal system: Secondary | ICD-10-CM

## 2021-07-28 DIAGNOSIS — R278 Other lack of coordination: Secondary | ICD-10-CM

## 2021-07-28 DIAGNOSIS — M25642 Stiffness of left hand, not elsewhere classified: Secondary | ICD-10-CM | POA: Diagnosis not present

## 2021-07-28 NOTE — Therapy (Signed)
[place], [place], [address] Phone: [phone]   Fax:  [phone]  Occupational Therapy Treatment  Patient Details  Name: Matthew Wise MRN: [id number] Date of Birth: 1961-01-23 Referring Provider (OT): Alysia Penna, MD   Encounter Date: 07/28/2021   OT End of Session - 07/28/21 1450     Visit Number 19    Number of Visits 24    Date for OT Re-Evaluation 08/06/21    Authorization Type [employer] medicare    Authorization Time Period 11/30/20-end of year. $30 copay. 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 two simple tubular adenomas removed. patient was advised to go to [facility] for colonoscopy but he did not keep appt.   IR CT HEAD LTD  02/02/2021   IR PERCUTANEOUS ART THROMBECTOMY/INFUSION INTRACRANIAL INC DIAG ANGIO  02/02/2021    RADIOLOGY WITH ANESTHESIA N/A 02/02/2021   Procedure: IR WITH ANESTHESIA;  Surgeon: Luanne Bras, MD;  Location: [facility];  Service: Radiology;  Laterality: N/A;   TEE WITHOUT CARDIOVERSION N/A 09/10/2020   Procedure: TRANSESOPHAGEAL ECHOCARDIOGRAM (TEE) WITH PROPOFOL;  Surgeon: Satira Sark, MD;  Location: [facility];  Service: Cardiovascular;  Laterality: N/A;    There were no vitals filed for this visit.   Subjective Assessment - 07/28/21 1445     Subjective  S: Nothing new to report.    Currently in Pain? No/denies                [facility] OT Assessment - 07/28/21 1445       Assessment   Medical Diagnosis S/P right MCA with left hemiparesis      Precautions   Precautions Fall    Precaution Comments left side hemiparesis, left side visual field deficits                      OT Treatments/Exercises (OP) - 07/28/21 1445       Exercises   Exercises Shoulder      Neurological Re-education Exercises   Weight Bearing Position Seated    Seated with weight on forearm Seated on edge of mat, facilitation provided to left UE to guide movement and to physically assist with activity. RUE used to grab cone from  tray table located on left side of body while weightbearing onto left forearm. Pt then focused on muscle activation in his LUE to help push himself back up to a seated posture. Cone was placed on the right side of his body. 8 cones completed total. Pt then completed a similar task with [place] although the [place] were placed on right side of body then placed on tray table on left side of body.                      OT Short Term Goals - 07/09/21 1343       OT SHORT TERM GOAL #1   Title Patient will be educated and independent with HEP completion in order to faciliate his progress in therapy and begin to use his LUE as a gross assist for 25% or more of daily tasks.    Time 6    Period Weeks    Target Date 06/25/21      OT SHORT TERM GOAL #2    Title Patient will demonstrate a 25% increase in A/ROM of the LUE (shoulder, elbow, wrist) as able while using his LUE as a gross assist when completing self care tasks such as dressing.    Time 6    Period Weeks    Status On-going      OT SHORT TERM GOAL #3   Title Patient will demonstrate increased fine motor coordination in his left hand by completing the 9 hole peg test with peg set-up if needed.    Baseline 8/8: Unable to complete even with ste up provided.    Time 6    Period Weeks    Status On-going      OT SHORT TERM GOAL #4   Title Patient will increase is left hand strength to 10# and his pinch strength to 5# in order to begin to use his left hand to stabilize objects when completing self feeding.    Baseline 8/8: grip strength: 5#, lateral pinch 2#, 3 point pinch: 0    Time 6    Period Weeks    Status On-going      OT SHORT TERM GOAL #5   Title Patient will completing bathing and dressing with Mod assist where needed while utilizing DME, Adaptive equipment, and/or compensatory techniques with improved sitting and standing balance.    Time 6    Period Weeks    Status Partially Met               OT Long Term Goals - 05/16/21 1415       OT LONG TERM GOAL #1   Title Patient will demonstrate improved LUE A/ROM by 50% or more while utilizing his LUE as his non dominant extremity to provide an active assist to complete tasks that require use of bilateral UE's.    Time 12    Period Weeks    Status On-going      OT LONG TERM GOAL #2   Title Patient will increase his LUE strength (shoulder, elbow, wrist) to 4/5 or better while demonstrating improved ability to complete bathing and dressing tasks.    Time 12    Period Weeks    Status On-going      OT LONG TERM GOAL #3   Title Patient will increase his fine motor coordination in his left hand by completing the 9 hole peg test in the standardized fashion under 1'.    Time 12    Period Weeks  Status On-going       OT LONG TERM GOAL #4   Title Patient will demonstrate improved left hand grip strength to 15# and pinch strength to 8# in order to manipulate and hold onto items in his left hand without dropping.    Time 12    Period Weeks    Status On-going      OT LONG TERM GOAL #5   Title Patient will completing bathing and dressing with min assist where needed while utilizing DME, Adaptive equipment, and/or compensatory techniques with improved sitting and standing balance.    Status On-going                   Plan - 07/28/21 1450     Clinical Impression Statement A: Focused on weightbearing along with active muscle activation in the left UE when transitioning from wearbearing position back up to seated upright. Pt required min-max physical assist for form and technique at times. He demonstrated a posterior lean initially at start of activity. VC for form and technique were provided along with physical assist. Dycem placed under left foot to prevent slipping with tactile cues and VC to lean slightly more forward versus laterally when weightshifting.    Body Structure / Function / Physical Skills ADL;Decreased knowledge of use of DME;Strength;Tone;GMC;Balance;UE functional use;Vision;ROM;IADL;Sensation;Coordination;FMC    Plan P: Continue with weightbearing onto forearm while seated on edge of mat. reaching with right arm.    Consulted and Agree with Plan of Care Patient             Patient will benefit from skilled therapeutic intervention in order to improve the following deficits and impairments:   Body Structure / Function / Physical Skills: ADL, Decreased knowledge of use of DME, Strength, Tone, GMC, Balance, UE functional use, Vision, ROM, IADL, Sensation, Coordination, [facility]       Visit Diagnosis: Other symptoms and signs involving the musculoskeletal system  Stiffness of left hand, not elsewhere classified  Other lack of coordination  Stiffness of left elbow, not elsewhere  classified    Problem List Patient Active Problem List   Diagnosis Date Noted   Urinary frequency    Dysphagia, post-stroke    Spastic hemiplegia affecting nondominant side (HCC)    Drug-induced hypotension    Pressure injury of skin 02/14/2021   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   Dyslipidemia    Hemiparesis affecting left side as late effect of stroke ([place])    Middle cerebral artery embolism, right 02/03/2021   Status post surgery 02/02/2021   Stroke (cerebrum) ([place]) 02/02/2021   Hypoglycemia 01/25/2021   Thyroid nodule 01/25/2021   Hypothyroidism 01/25/2021   Near syncope 01/21/2021   H/O adenomatous polyp of colon 12/30/2020   Acute ischemic stroke ([place]) 11/28/2020   Essential hypertension 11/28/2020   Mixed hyperlipidemia 11/28/2020   Acute CVA (cerebrovascular accident) ([place]) 09/09/2020   Transient ischemic attack 09/07/2020   History of hypertension 09/07/2020   Elevated troponin 09/07/2020   Dizziness 09/07/2020   Numbness and tingling 09/07/2020   H/O medication noncompliance 09/07/2020   Medicare annual wellness visit, subsequent 12/05/2019   Nicotine dependence 12/05/2019   Chronic kidney disease, stage 3 unspecified ([place]) 10/24/2019   Prediabetes 10/24/2019   Edema of foot 09/14/2017   Tubular adenoma of colon 09/14/2017   Long term (current) use of anticoagulants 11/14/2014   Sickle cell trait (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 [place], [place], [address] Phone: [phone]   Fax:  [phone]  Name: Matthew Wise MRN: [id number] Date of Birth: Apr 11, 1961

## 2021-07-28 NOTE — Therapy (Signed)
[facility] [address] [place], [place], [address] Phone: [phone]   Fax:  [phone]  Physical Therapy Treatment  Patient Details  Name: Matthew Wise MRN: [id number] Date of Birth: May 03, 1961 Referring Provider (PT): Alysia Penna MD   Encounter Date: 07/28/2021   PT End of Session - 07/28/21 1431     Visit Number 17    Number of Visits 23    Date for PT Re-Evaluation 08/13/21    Authorization Type [employer] MEdicare    Progress Note Due on Visit 23    PT Start Time 1430    PT Stop Time 1515    PT Time Calculation (min) 45 min    Equipment Utilized During Treatment Gait belt   WC behind   Activity Tolerance Patient tolerated treatment well    Behavior During Therapy [facility] for tasks assessed/performed             Past Medical History:  Diagnosis Date   Acute CVA (cerebrovascular accident) ([place]) 09/09/2020   Chronic anticoagulation 11/14/2014   CKD (chronic kidney disease) stage 3, GFR 30-59 ml/min ([place]) 10/24/2019   CKD (chronic kidney disease) stage 3, GFR 30-59 ml/min ([place]) 10/24/2019   Clotting disorder ([place])    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 two simple tubular adenomas removed. patient was advised to go to [facility] for colonoscopy but he did not keep appt.   IR CT HEAD LTD  02/02/2021   IR PERCUTANEOUS ART THROMBECTOMY/INFUSION INTRACRANIAL INC DIAG ANGIO  02/02/2021   RADIOLOGY WITH  ANESTHESIA N/A 02/02/2021   Procedure: IR WITH ANESTHESIA;  Surgeon: Luanne Bras, MD;  Location: [facility];  Service: Radiology;  Laterality: N/A;   TEE WITHOUT CARDIOVERSION N/A 09/10/2020   Procedure: TRANSESOPHAGEAL ECHOCARDIOGRAM (TEE) WITH PROPOFOL;  Surgeon: Satira Sark, MD;  Location: [facility];  Service: Cardiovascular;  Laterality: N/A;    There were no vitals filed for this visit.   Subjective Assessment - 07/28/21 1432     Subjective No new issues. Has been walking and doing some standing.    Pertinent History CVA 02/02/21    Patient Stated Goals Be able to get up and go without walker    Currently in Pain? No/denies                               [facility] Adult PT Treatment/Exercise - 07/28/21 0001       Knee/Hip Exercises: Standing   Heel Raises Both;10 reps    Gait Training 60 feet x2 with HW and WC follow    Other Standing Knee Exercises sidestepping in // bars 3RT with HHA x 2    Other Standing Knee Exercises LLE step taps on 2 inch box with HHA x15      Knee/Hip Exercises: Seated   Sit to Sand 5 reps;with UE support  PT Short Term Goals - 06/25/21 1638       PT SHORT TERM GOAL #1   Title Patient will be independent with initial HEP and self-management strategies to improve functional outcomes    Time 4    Period Weeks    Status On-going    Target Date 06/19/21      PT SHORT TERM GOAL #2   Title Patient will be Min A with transfers and bed mobility    Time 4    Period Weeks    Status Achieved    Target Date 06/19/21               PT Long Term Goals - 07/16/21 1725       PT LONG TERM GOAL #1   Title Patient will be Mod I with bed mobility and transfers    Baseline Mod I with all except sit to supine. Does require multiple attempts on sit to stand    Time 8    Period Weeks    Status Partially Met      PT LONG TERM GOAL #2   Title Patient will be able to ambulate >100 feet with LRAD for  improved household mobility    Baseline Current 115 feet with HW    Time 8    Period Weeks    Status Achieved      PT LONG TERM GOAL #3   Title Patient will have at least 4/5 MMT throughout LLE (except ankle DF) for improved functional mobility and stair/ step navigation.    Baseline See MMT    Time 8    Period Weeks    Status On-going                   Plan - 07/28/21 1537     Clinical Impression Statement Patient tolerated session well today. Continued use of arm sling with gait. Patient noting some improved comfort with this. Showing some improved gait speed and sequencing. Continues to have difficulty with LLE coordination and ground clearance in standing. Continues to heavily favor RT side with functional transfers but improves some with practice. Will continue to progress functional activity as tolerated for improved functional mobility.    Examination-Activity Limitations Caring for Others;Transfers;Locomotion Level;Toileting;Stand    Examination-Participation Restrictions Yard Work;Cleaning;Laundry;Community Activity    Stability/Clinical Decision Making Evolving/Moderate complexity    Rehab Potential Fair    PT Frequency 2x / week    PT Duration 4 weeks    PT Treatment/Interventions ADLs/Self Care Home Management;Aquatic Therapy;Biofeedback;Cryotherapy;Fluidtherapy;Contrast Bath;Electrical Stimulation;Therapeutic exercise;Orthotic Fit/Training;Patient/family education;Manual lymph drainage;Manual techniques;Dry needling;Energy conservation;Spinal Manipulations;Joint Manipulations;Splinting;Taping;Compression bandaging;Vasopneumatic Device;Therapeutic activities;Parrafin;Ultrasound;Traction;Functional mobility training;Neuromuscular re-education;Stair training;Gait training;Moist Heat;Iontophoresis 47m/ml Dexamethasone;DME Instruction;Balance training;Passive range of motion;Scar mobilization;Wheelchair mobility training;Visual/perceptual remediation/compensation    PT Next  Visit Plan Progress functional mobility as tolerated. Seated and mat strengthening, encourage WB through LT side. Standing balance> gait.    PT Home Exercise Plan Eval: marches, LAQs, heel/toe raise; 7/1:  bridge and sidelying clam 7/12 heel slide, supine hip abduction; tall sitting with lumbar support    Consulted and Agree with Plan of Care Patient             Patient will benefit from skilled therapeutic intervention in order to improve the following deficits and impairments:  Abnormal gait, Improper body mechanics, Impaired sensation, Decreased balance, Difficulty walking, Decreased activity tolerance, Decreased range of motion, Decreased strength, Hypomobility, Impaired tone, Decreased mobility, Decreased coordination  Visit Diagnosis: Muscle weakness (generalized)  Difficulty in walking, not  elsewhere classified  Other abnormalities of gait and mobility  Other symptoms and signs involving the musculoskeletal system     Problem List Patient Active Problem List   Diagnosis Date Noted   Urinary frequency    Dysphagia, post-stroke    Spastic hemiplegia affecting nondominant side (HCC)    Drug-induced hypotension    Pressure injury of skin 02/14/2021   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)    Middle cerebral artery embolism, right 02/03/2021   Status post surgery 02/02/2021   Stroke (cerebrum) ([place]) 02/02/2021   Hypoglycemia 01/25/2021   Thyroid nodule 01/25/2021   Hypothyroidism 01/25/2021   Near syncope 01/21/2021   H/O adenomatous polyp of colon 12/30/2020   Acute ischemic stroke ([place]) 11/28/2020   Essential hypertension 11/28/2020   Mixed hyperlipidemia 11/28/2020   Acute CVA (cerebrovascular accident) ([place]) 09/09/2020   Transient ischemic attack 09/07/2020   History of hypertension 09/07/2020   Elevated troponin 09/07/2020   Dizziness 09/07/2020   Numbness and  tingling 09/07/2020   H/O medication noncompliance 09/07/2020   Medicare annual wellness visit, subsequent 12/05/2019   Nicotine dependence 12/05/2019   Chronic kidney disease, stage 3 unspecified ([place]) 10/24/2019   Prediabetes 10/24/2019   Edema of foot 09/14/2017   Tubular adenoma of colon 09/14/2017   Long term (current) use of anticoagulants 11/14/2014   Sickle cell trait ([place]) 08/29/2012   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

## 2021-07-30 ENCOUNTER — Ambulatory Visit (HOSPITAL_COMMUNITY): Payer: Medicare Other | Admitting: Physical Therapy

## 2021-07-30 ENCOUNTER — Encounter (HOSPITAL_COMMUNITY): Payer: Self-pay

## 2021-07-30 ENCOUNTER — Other Ambulatory Visit: Payer: Self-pay

## 2021-07-30 ENCOUNTER — Ambulatory Visit (HOSPITAL_COMMUNITY): Payer: Medicare Other

## 2021-07-30 DIAGNOSIS — R29898 Other symptoms and signs involving the musculoskeletal system: Secondary | ICD-10-CM

## 2021-07-30 DIAGNOSIS — M25642 Stiffness of left hand, not elsewhere classified: Secondary | ICD-10-CM

## 2021-07-30 DIAGNOSIS — M25622 Stiffness of left elbow, not elsewhere classified: Secondary | ICD-10-CM

## 2021-07-30 DIAGNOSIS — R2689 Other abnormalities of gait and mobility: Secondary | ICD-10-CM

## 2021-07-30 DIAGNOSIS — M6281 Muscle weakness (generalized): Secondary | ICD-10-CM | POA: Diagnosis not present

## 2021-07-30 DIAGNOSIS — R262 Difficulty in walking, not elsewhere classified: Secondary | ICD-10-CM

## 2021-07-30 DIAGNOSIS — R278 Other lack of coordination: Secondary | ICD-10-CM

## 2021-07-30 NOTE — Therapy (Signed)
[place], [place], [address] Phone: [phone]   Fax:  [phone]  Physical Therapy Treatment  Patient Details  Name: Matthew Wise MRN: [id number] Date of Birth: [birth year] Referring Provider (PT): Alysia Penna MD   Encounter Date: 07/30/2021   PT End of Session - 07/30/21 1355     Visit Number 18    Number of Visits 23    Date for PT Re-Evaluation 08/13/21    Authorization Type [employer]    Progress Note Due on Visit 23    PT Start Time 1348    PT Stop Time 1430    PT Time Calculation (min) 42 min    Equipment Utilized During Treatment Gait belt   WC behind   Activity Tolerance Patient tolerated treatment well    Behavior During Therapy [facility] for tasks assessed/performed             Past Medical History:  Diagnosis Date   Acute CVA (cerebrovascular accident) ([place]) 09/09/2020   Chronic anticoagulation 11/14/2014   CKD (chronic kidney disease) stage 3, GFR 30-59 ml/min ([place]) 10/24/2019   CKD (chronic kidney disease) stage 3, GFR 30-59 ml/min ([place]) 10/24/2019   Clotting disorder ([place])    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 two simple tubular adenomas removed. patient was advised to go to [facility] for colonoscopy but he did not keep appt.   IR CT HEAD LTD  02/02/2021   IR PERCUTANEOUS ART THROMBECTOMY/INFUSION INTRACRANIAL INC DIAG ANGIO  02/02/2021   RADIOLOGY WITH  ANESTHESIA N/A 02/02/2021   Procedure: IR WITH ANESTHESIA;  Surgeon: Luanne Bras, MD;  Location: [place];  Service: Radiology;  Laterality: N/A;   TEE WITHOUT CARDIOVERSION N/A 09/10/2020   Procedure: TRANSESOPHAGEAL ECHOCARDIOGRAM (TEE) WITH PROPOFOL;  Surgeon: Satira Sark, MD;  Location: [facility];  Service: Cardiovascular;  Laterality: N/A;    There were no vitals filed for this visit.   Subjective Assessment - 07/30/21 1354     Subjective Reports no new issues. No pain.    Pertinent History CVA 02/02/21    Patient Stated Goals Be able to get up and go without walker    Currently in Pain? No/denies                               [facility] Adult PT Treatment/Exercise - 07/30/21 0001       Knee/Hip Exercises: Standing   Gait Training 115 feet with HW and WC follow    Other Standing Knee Exercises sidestepping in // bars 3RT with HHA x 2, retro walk in // bars 4 RT    Other Standing Knee Exercises step taps on 2 inch box with HHA 2 x 10 each      Knee/Hip Exercises: Seated   Long Arc Quad 10 reps;Left    Marching Both;10 reps    Sit to [employer]  5 reps                      PT Short Term Goals - 06/25/21 1638       PT SHORT TERM GOAL #1   Title Patient will be independent with initial HEP and self-management strategies to improve functional outcomes    Time 4    Period Weeks    Status On-going    Target Date 06/19/21      PT SHORT TERM GOAL #2   Title Patient will be Min A with transfers and bed mobility    Time 4    Period Weeks    Status Achieved    Target Date 06/19/21               PT Long Term Goals - 07/16/21 1725       PT LONG TERM GOAL #1   Title Patient will be Mod I with bed mobility and transfers    Baseline Mod I with all except sit to supine. Does require multiple attempts on sit to stand    Time 8    Period Weeks    Status Partially Met      PT LONG TERM GOAL #2   Title Patient will be able to ambulate >100  feet with LRAD for improved household mobility    Baseline Current 115 feet with HW    Time 8    Period Weeks    Status Achieved      PT LONG TERM GOAL #3   Title Patient will have at least 4/5 MMT throughout LLE (except ankle DF) for improved functional mobility and stair/ step navigation.    Baseline See MMT    Time 8    Period Weeks    Status On-going                   Plan - 07/30/21 1450     Clinical Impression Statement Patient did well today. Focused more on ambulatory patterns. Added retro walking in bars with cues for improved LLE elevation. Patient receptive to cueing but continues to have difficulty with LLE ground clearance compounded by foot drop. This is more pronounced when fatigued. Will continue to progress functional mobility and ambulation as tolerated.    Examination-Activity Limitations Caring for Others;Transfers;Locomotion Level;Toileting;Stand    Examination-Participation Restrictions Yard Work;Cleaning;Laundry;Community Activity    Stability/Clinical Decision Making Evolving/Moderate complexity    Rehab Potential Fair    PT Frequency 2x / week    PT Duration 4 weeks    PT Treatment/Interventions ADLs/Self Care Home Management;Aquatic Therapy;Biofeedback;Cryotherapy;Fluidtherapy;Contrast Bath;Electrical Stimulation;Therapeutic exercise;Orthotic Fit/Training;Patient/family education;Manual lymph drainage;Manual techniques;Dry needling;Energy conservation;Spinal Manipulations;Joint Manipulations;Splinting;Taping;Compression bandaging;Vasopneumatic Device;Therapeutic activities;Parrafin;Ultrasound;Traction;Functional mobility training;Neuromuscular re-education;Stair training;Gait training;Moist Heat;Iontophoresis 81m/ml Dexamethasone;DME Instruction;Balance training;Passive range of motion;Scar mobilization;Wheelchair mobility training;Visual/perceptual remediation/compensation    PT Next Visit Plan Progress functional mobility as tolerated. Seated and mat  strengthening, encourage WB through LT side. Standing balance> gait. Trial LLE AAROM over obstacles in // bars    PT Home Exercise Plan Eval: marches, LAQs, heel/toe raise; 7/1:  bridge and sidelying clam 7/12 heel slide, supine hip abduction; tall sitting with lumbar support    Consulted and Agree with Plan of Care Patient             Patient will benefit from skilled therapeutic intervention in order to improve the following deficits and impairments:  Abnormal gait, Improper body mechanics, Impaired sensation, Decreased balance, Difficulty walking, Decreased activity tolerance, Decreased range of motion,  Decreased strength, Hypomobility, Impaired tone, Decreased mobility, Decreased coordination  Visit Diagnosis: Muscle weakness (generalized)  Difficulty in walking, not elsewhere classified  Other abnormalities of gait and mobility  Other symptoms and signs involving the musculoskeletal system     Problem List Patient Active Problem List   Diagnosis Date Noted   Urinary frequency    Dysphagia, post-stroke    Spastic hemiplegia affecting nondominant side (HCC)    Drug-induced hypotension    Pressure injury of skin 02/14/2021   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   Dyslipidemia    Hemiparesis affecting left side as late effect of stroke ([place])    Middle cerebral artery embolism, right 02/03/2021   Status post surgery 02/02/2021   Stroke (cerebrum) ([place]) 02/02/2021   Hypoglycemia 01/25/2021   Thyroid nodule 01/25/2021   Hypothyroidism 01/25/2021   Near syncope 01/21/2021   H/O adenomatous polyp of colon 12/30/2020   Acute ischemic stroke ([place]) 11/28/2020   Essential hypertension 11/28/2020   Mixed hyperlipidemia 11/28/2020   Acute CVA (cerebrovascular accident) ([place]) 09/09/2020   Transient ischemic attack 09/07/2020   History of hypertension 09/07/2020   Elevated troponin 09/07/2020   Dizziness 09/07/2020   Numbness and tingling 09/07/2020   H/O  medication noncompliance 09/07/2020   Medicare annual wellness visit, subsequent 12/05/2019   Nicotine dependence 12/05/2019   Chronic kidney disease, stage 3 unspecified ([place]) 10/24/2019   Prediabetes 10/24/2019   Edema of foot 09/14/2017   Tubular adenoma of colon 09/14/2017   Long term (current) use of anticoagulants 11/14/2014   Sickle cell trait ([place]) 08/29/2012   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 [place], [place], [address] Phone: [phone]   Fax:  [phone]  Name: Matthew Wise MRN: [id number] Date of Birth: 1961/11/29

## 2021-07-31 ENCOUNTER — Other Ambulatory Visit (INDEPENDENT_AMBULATORY_CARE_PROVIDER_SITE_OTHER): Payer: Self-pay | Admitting: Nurse Practitioner

## 2021-07-31 DIAGNOSIS — D6851 Activated protein C resistance: Secondary | ICD-10-CM

## 2021-07-31 DIAGNOSIS — I10 Essential (primary) hypertension: Secondary | ICD-10-CM

## 2021-07-31 DIAGNOSIS — Z7901 Long term (current) use of anticoagulants: Secondary | ICD-10-CM

## 2021-07-31 DIAGNOSIS — Z8673 Personal history of transient ischemic attack (TIA), and cerebral infarction without residual deficits: Secondary | ICD-10-CM

## 2021-07-31 NOTE — Therapy (Signed)
[place], [place], [address] Phone: [phone]   Fax:  [phone]  Occupational Therapy Treatment  Patient Details  Name: Matthew Wise MRN: [id number] Date of Birth: 1961-07-08 Referring Provider (OT): Alysia Penna, MD   Encounter Date: 07/30/2021   OT End of Session - 07/31/21 1129     Visit Number 20    Number of Visits 24    Date for OT Re-Evaluation 08/06/21    Authorization Type UHC medicare    Authorization Time Period 11/30/20-end of year. $30 copay. 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 two simple tubular adenomas removed. patient was advised to go to [facility] for colonoscopy but he did not keep appt.   IR CT HEAD LTD  02/02/2021   IR PERCUTANEOUS ART THROMBECTOMY/INFUSION INTRACRANIAL INC DIAG ANGIO  02/02/2021    RADIOLOGY WITH ANESTHESIA N/A 02/02/2021   Procedure: IR WITH ANESTHESIA;  Surgeon: Luanne Bras, MD;  Location: [place];  Service: Radiology;  Laterality: N/A;   TEE WITHOUT CARDIOVERSION N/A 09/10/2020   Procedure: TRANSESOPHAGEAL ECHOCARDIOGRAM (TEE) WITH PROPOFOL;  Surgeon: Satira Sark, MD;  Location[facility];  Service: Cardiovascular;  Laterality: N/A;    There were no vitals filed for this visit.   Subjective Assessment - 07/30/21 1445     Subjective  S: Nothing new to report.    Currently in Pain? No/denies                [facility] OT Assessment - 07/31/21 1129       Assessment   Medical Diagnosis S/P right MCA with left hemiparesis      Precautions   Precautions Fall    Precaution Comments left side hemiparesis, left side visual field deficits                      OT Treatments/Exercises (OP) - 07/31/21 1125       Exercises   Exercises Shoulder      Neurological Re-education Exercises   Weight Bearing Position Seated    Seated with weight on forearm WHile on edge mat, utilized 10 cones placed on 10 inch step; RUE used to grab and place cones on tray table ; alternating weightbearing  from right to left.    Seated with weight on hand While on edge of mat; large blue therapy ball used to perform intermittent weight bearing.    Other Weight-Bearing Exercises 1 Seated on edge of mat; bilateral hands on left knee; focused on alternating weightbearing into left knee as if going to stand.    Other Weight-Bearing Exercises 2 Large pegboard placed on vertical surface. Right hand placed pegs while weightbearing into extended left UE.                      OT Short Term Goals - 07/09/21 1343       OT SHORT TERM GOAL #1   Title Patient will be educated and independent with HEP completion in order to faciliate his progress in therapy and begin to use his LUE as a gross assist for 25% or more of daily tasks.    Time 6    Period Weeks    Target  Date 06/25/21      OT SHORT TERM GOAL #2   Title Patient will demonstrate a 25% increase in A/ROM of the LUE (shoulder, elbow, wrist) as able while using his LUE as a gross assist when completing self care tasks such as dressing.    Time 6    Period Weeks    Status On-going      OT SHORT TERM GOAL #3   Title Patient will demonstrate increased fine motor coordination in his left hand by completing the 9 hole peg test with peg set-up if needed.    Baseline 8/8: Unable to complete even with ste up provided.    Time 6    Period Weeks    Status On-going      OT SHORT TERM GOAL #4   Title Patient will increase is left hand strength to 10# and his pinch strength to 5# in order to begin to use his left hand to stabilize objects when completing self feeding.    Baseline 8/8: grip strength: 5#, lateral pinch 2#, 3 point pinch: 0    Time 6    Period Weeks    Status On-going      OT SHORT TERM GOAL #5   Title Patient will completing bathing and dressing with Mod assist where needed while utilizing DME, Adaptive equipment, and/or compensatory techniques with improved sitting and standing balance.    Time 6    Period Weeks    Status Partially Met               OT Long Term Goals - 05/16/21 1415       OT LONG TERM GOAL #1   Title Patient will demonstrate improved LUE A/ROM by 50% or more while utilizing his LUE as his non dominant extremity to provide an active assist to complete tasks that require use of bilateral UE's.    Time 12    Period Weeks    Status On-going      OT LONG TERM GOAL #2   Title Patient will increase his LUE strength (shoulder, elbow, wrist) to 4/5 or better while demonstrating improved ability to complete bathing and dressing tasks.    Time 12    Period Weeks    Status On-going      OT LONG TERM GOAL #3   Title Patient will increase his fine motor coordination in his left hand by completing the 9 hole peg test in the standardized fashion under 1'.    Time 12  Period Weeks    Status On-going      OT LONG TERM GOAL #4   Title Patient will demonstrate improved left hand grip strength to 15# and pinch strength to 8# in order to manipulate and hold onto items in his left hand without dropping.    Time 12    Period Weeks    Status On-going      OT LONG TERM GOAL #5   Title Patient will completing bathing and dressing with min assist where needed while utilizing DME, Adaptive equipment, and/or compensatory techniques with improved sitting and standing balance.    Status On-going                   Plan - 07/31/21 1130     Clinical Impression Statement A: Continued with weightbearing and weight shifting on and off the left forearm and/or palm. Provided support to left UE during all activities for proper form and technique. Required mod tactile cueing for proper form and technique as well.    Body Structure / Function / Physical Skills ADL;Decreased knowledge of use of DME;Strength;Tone;GMC;Balance;UE functional use;Vision;ROM;IADL;Sensation;Coordination;FMC    Plan P: Continue with weightbearing onto forearm while seated on edge of mat. reaching with right arm.    Consulted and Agree with Plan of Care Patient             Patient will benefit from skilled therapeutic intervention in order to improve the following deficits and impairments:   Body Structure / Function / Physical Skills: ADL, Decreased knowledge of use of DME, Strength, Tone, GMC, Balance, UE functional use, Vision, ROM, IADL, Sensation, Coordination, [facility]       Visit Diagnosis: Stiffness of left hand, not elsewhere classified  Other symptoms and signs involving the musculoskeletal system  Other lack of coordination  Stiffness of left elbow, not elsewhere classified    Problem List Patient Active Problem List   Diagnosis Date Noted   Urinary frequency    Dysphagia, post-stroke    Spastic hemiplegia affecting nondominant side (HCC)    Drug-induced  hypotension    Pressure injury of skin 02/14/2021   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   Dyslipidemia    Hemiparesis affecting left side as late effect of stroke ([place])    Middle cerebral artery embolism, right 02/03/2021   Status post surgery 02/02/2021   Stroke (cerebrum) ([place]) 02/02/2021   Hypoglycemia 01/25/2021   Thyroid nodule 01/25/2021   Hypothyroidism 01/25/2021   Near syncope 01/21/2021   H/O adenomatous polyp of colon 12/30/2020   Acute ischemic stroke ([place]) 11/28/2020   Essential hypertension 11/28/2020   Mixed hyperlipidemia 11/28/2020   Acute CVA (cerebrovascular accident) ([place]) 09/09/2020   Transient ischemic attack 09/07/2020   History of hypertension 09/07/2020   Elevated troponin 09/07/2020   Dizziness 09/07/2020   Numbness and tingling 09/07/2020   H/O medication noncompliance 09/07/2020   Medicare annual wellness visit, subsequent 12/05/2019   Nicotine dependence 12/05/2019   Chronic kidney disease, stage 3 unspecified ([place]) 10/24/2019   Prediabetes 10/24/2019   Edema of foot 09/14/2017   Tubular adenoma of colon 09/14/2017   Long term (current) use of anticoagulants 11/14/2014   Sickle cell trait ([place]) 08/29/2012   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

## 2021-08-05 ENCOUNTER — Other Ambulatory Visit: Payer: Self-pay

## 2021-08-05 ENCOUNTER — Encounter (HOSPITAL_COMMUNITY): Payer: Self-pay

## 2021-08-05 ENCOUNTER — Ambulatory Visit (HOSPITAL_COMMUNITY): Payer: Medicare Other | Admitting: Physical Therapy

## 2021-08-05 ENCOUNTER — Ambulatory Visit (HOSPITAL_COMMUNITY): Payer: Medicare Other | Attending: Physical Medicine & Rehabilitation

## 2021-08-05 ENCOUNTER — Encounter (HOSPITAL_COMMUNITY): Payer: Self-pay | Admitting: Physical Therapy

## 2021-08-05 DIAGNOSIS — M25642 Stiffness of left hand, not elsewhere classified: Secondary | ICD-10-CM

## 2021-08-05 DIAGNOSIS — R2689 Other abnormalities of gait and mobility: Secondary | ICD-10-CM | POA: Insufficient documentation

## 2021-08-05 DIAGNOSIS — R262 Difficulty in walking, not elsewhere classified: Secondary | ICD-10-CM | POA: Diagnosis not present

## 2021-08-05 DIAGNOSIS — R29898 Other symptoms and signs involving the musculoskeletal system: Secondary | ICD-10-CM

## 2021-08-05 DIAGNOSIS — M25622 Stiffness of left elbow, not elsewhere classified: Secondary | ICD-10-CM

## 2021-08-05 DIAGNOSIS — M6281 Muscle weakness (generalized): Secondary | ICD-10-CM | POA: Insufficient documentation

## 2021-08-05 DIAGNOSIS — R278 Other lack of coordination: Secondary | ICD-10-CM | POA: Diagnosis not present

## 2021-08-05 NOTE — Therapy (Signed)
[place], [place], [address] Phone: [phone]   Fax:  [phone]  Physical Therapy Treatment  Patient Details  Name: Matthew Wise MRN: [id number] Date of Birth: 10/02/1961 Referring Provider (PT): Alysia Penna MD   Encounter Date: 08/05/2021   PT End of Session - 08/05/21 1354     Visit Number 19    Number of Visits 23    Date for PT Re-Evaluation 08/13/21    Authorization Type [employer]    Progress Note Due on Visit 23    PT Start Time 1349    PT Stop Time 1430    PT Time Calculation (min) 41 min    Equipment Utilized During Treatment Gait belt   WC behind   Activity Tolerance Patient tolerated treatment well    Behavior During Therapy [facility] for tasks assessed/performed             Past Medical History:  Diagnosis Date   Acute CVA (cerebrovascular accident) ([place]) 09/09/2020   Chronic anticoagulation 11/14/2014   CKD (chronic kidney disease) stage 3, GFR 30-59 ml/min ([place]) 10/24/2019   CKD (chronic kidney disease) stage 3, GFR 30-59 ml/min ([place]) 10/24/2019   Clotting disorder ([place])    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 two simple tubular adenomas removed. patient was advised to go to [facility] for colonoscopy but he did not keep appt.   IR CT HEAD LTD  02/02/2021   IR PERCUTANEOUS ART THROMBECTOMY/INFUSION INTRACRANIAL INC DIAG ANGIO  02/02/2021   RADIOLOGY WITH  ANESTHESIA N/A 02/02/2021   Procedure: IR WITH ANESTHESIA;  Surgeon: Luanne Bras, MD;  Location: [facility];  Service: Radiology;  Laterality: N/A;   TEE WITHOUT CARDIOVERSION N/A 09/10/2020   Procedure: TRANSESOPHAGEAL ECHOCARDIOGRAM (TEE) WITH PROPOFOL;  Surgeon: Satira Sark, MD;  Location: [facility];  Service: Cardiovascular;  Laterality: N/A;    There were no vitals filed for this visit.   Subjective Assessment - 08/05/21 1354     Subjective No new issues reported    Pertinent History CVA 02/02/21    Patient Stated Goals Be able to get up and go without walker    Currently in Pain? No/denies                               [facility] Adult PT Treatment/Exercise - 08/05/21 0001       Knee/Hip Exercises: Standing   Hip Flexion 20 reps    Hip Flexion Limitations alternating    Hip Abduction Left;2 sets;10 reps    Abduction Limitations cues to avoid leg ER    Gait Training 40 feet with HW and WC follow    Other Standing Knee Exercises sidestepping in // bars 2RT with HHA x 2, retro walk in // bars 2 RT    Other Standing Knee Exercises step taps on 6 inch box with HHA 2  x 10 LLE with active assist      Knee/Hip Exercises: Seated   Sit to Sand 10 reps   with active assist for LT weight shift                     PT Short Term Goals - 06/25/21 1638       PT SHORT TERM GOAL #1   Title Patient will be independent with initial HEP and self-management strategies to improve functional outcomes    Time 4    Period Weeks    Status On-going    Target Date 06/19/21      PT SHORT TERM GOAL #2   Title Patient will be Min A with transfers and bed mobility    Time 4    Period Weeks    Status Achieved    Target Date 06/19/21               PT Long Term Goals - 07/16/21 1725       PT LONG TERM GOAL #1   Title Patient will be Mod I with bed mobility and transfers    Baseline Mod I with all except sit to supine. Does require multiple attempts on sit  to stand    Time 8    Period Weeks    Status Partially Met      PT LONG TERM GOAL #2   Title Patient will be able to ambulate >100 feet with LRAD for improved household mobility    Baseline Current 115 feet with HW    Time 8    Period Weeks    Status Achieved      PT LONG TERM GOAL #3   Title Patient will have at least 4/5 MMT throughout LLE (except ankle DF) for improved functional mobility and stair/ step navigation.    Baseline See MMT    Time 8    Period Weeks    Status On-going                   Plan - 08/05/21 1636     Clinical Impression Statement Patient progressing steadily. Continues to demo gait deficits related to poor LLE coordination. Patient also continues to avoid full WB through LLE with functional transfers. Worked on this today with active assist and verbal cues to place more weight through LLE to improve efficiency of transfers. Patient does show improvement with practice, but poor return after moving on to other activity. Patient will continue to benefit from skilled progressions for improved LLE coordination to improve functional mobility and transfers.    Examination-Activity Limitations Caring for Others;Transfers;Locomotion Level;Toileting;Stand    Examination-Participation Restrictions Yard Work;Cleaning;Laundry;Community Activity    Stability/Clinical Decision Making Evolving/Moderate complexity    Rehab Potential Fair    PT Frequency 2x / week    PT Duration 4 weeks    PT Treatment/Interventions ADLs/Self Care Home Management;Aquatic Therapy;Biofeedback;Cryotherapy;Fluidtherapy;Contrast Bath;Electrical Stimulation;Therapeutic exercise;Orthotic Fit/Training;Patient/family education;Manual lymph drainage;Manual techniques;Dry needling;Energy conservation;Spinal Manipulations;Joint Manipulations;Splinting;Taping;Compression bandaging;Vasopneumatic Device;Therapeutic activities;Parrafin;Ultrasound;Traction;Functional mobility training;Neuromuscular  re-education;Stair training;Gait training;Moist Heat;Iontophoresis 2m/ml Dexamethasone;DME Instruction;Balance training;Passive range of motion;Scar mobilization;Wheelchair mobility training;Visual/perceptual remediation/compensation    PT Next Visit Plan Progress functional mobility as tolerated. Seated and mat strengthening, encourage WB through LT side. Standing balance> gait.  LLE AAROM over obstacles in // bars    PT Home Exercise Plan Eval: marches, LAQs, heel/toe raise; 7/1:  bridge and sidelying clam 7/12 heel slide, supine hip abduction; tall sitting with lumbar support    Consulted and  Agree with Plan of Care Patient             Patient will benefit from skilled therapeutic intervention in order to improve the following deficits and impairments:  Abnormal gait, Improper body mechanics, Impaired sensation, Decreased balance, Difficulty walking, Decreased activity tolerance, Decreased range of motion, Decreased strength, Hypomobility, Impaired tone, Decreased mobility, Decreased coordination  Visit Diagnosis: Muscle weakness (generalized)  Difficulty in walking, not elsewhere classified  Other abnormalities of gait and mobility  Other symptoms and signs involving the musculoskeletal system     Problem List Patient Active Problem List   Diagnosis Date Noted   Urinary frequency    Dysphagia, post-stroke    Spastic hemiplegia affecting nondominant side (HCC)    Drug-induced hypotension    Pressure injury of skin 02/14/2021   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   Dyslipidemia    Hemiparesis affecting left side as late effect of stroke ([place])    Middle cerebral artery embolism, right 02/03/2021   Status post surgery 02/02/2021   Stroke (cerebrum) ([place]) 02/02/2021   Hypoglycemia 01/25/2021   Thyroid nodule 01/25/2021   Hypothyroidism 01/25/2021   Near syncope 01/21/2021   H/O adenomatous polyp of colon 12/30/2020   Acute ischemic stroke ([place]) 11/28/2020    Essential hypertension 11/28/2020   Mixed hyperlipidemia 11/28/2020   Acute CVA (cerebrovascular accident) ([place]) 09/09/2020   Transient ischemic attack 09/07/2020   History of hypertension 09/07/2020   Elevated troponin 09/07/2020   Dizziness 09/07/2020   Numbness and tingling 09/07/2020   H/O medication noncompliance 09/07/2020   Medicare annual wellness visit, subsequent 12/05/2019   Nicotine dependence 12/05/2019   Chronic kidney disease, stage 3 unspecified ([place]) 10/24/2019   Prediabetes 10/24/2019   Edema of foot 09/14/2017   Tubular adenoma of colon 09/14/2017   Long term (current) use of anticoagulants 11/14/2014   Sickle cell trait ([place]) 08/29/2012   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

## 2021-08-05 NOTE — Therapy (Signed)
[place], [place], [address] Phone: [phone]   Fax:  [phone]  Occupational Therapy Treatment  Patient Details  Name: Matthew Wise MRN: [id number] Date of Birth: 05-23-1961 Referring Provider (OT): Alysia Penna, MD   Encounter Date: 08/05/2021   OT End of Session - 08/05/21 1646     Visit Number 21    Number of Visits 24    Date for OT Re-Evaluation 08/06/21    Authorization Type UHC medicare    Authorization Time Period 11/30/20-end of year. $30 copay. No visit limit    Progress Note Due on Visit 24    OT Start Time 1436   PT session ended later   OT Stop Time 1511    OT Time Calculation (min) 35 min    Activity Tolerance Patient tolerated treatment well    Behavior During Therapy [facility] for tasks assessed/performed             Past Medical History:  Diagnosis Date   Acute CVA (cerebrovascular accident) ([place]) 09/09/2020   Chronic anticoagulation 11/14/2014   CKD (chronic kidney disease) stage 3, GFR 30-59 ml/min ([place]) 10/24/2019   CKD (chronic kidney disease) stage 3, GFR 30-59 ml/min ([place]) 10/24/2019   Clotting disorder ([place])    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 two simple tubular adenomas removed. patient was advised to go to [facility] for colonoscopy but he did not keep appt.   IR CT HEAD LTD  02/02/2021   IR PERCUTANEOUS ART THROMBECTOMY/INFUSION INTRACRANIAL INC  DIAG ANGIO  02/02/2021   RADIOLOGY WITH ANESTHESIA N/A 02/02/2021   Procedure: IR WITH ANESTHESIA;  Surgeon: Luanne Bras, MD;  Location: [place];  Service: Radiology;  Laterality: N/A;   TEE WITHOUT CARDIOVERSION N/A 09/10/2020   Procedure: TRANSESOPHAGEAL ECHOCARDIOGRAM (TEE) WITH PROPOFOL;  Surgeon: Satira Sark, MD;  Location: [facility];  Service: Cardiovascular;  Laterality: N/A;    There were no vitals filed for this visit.   Subjective Assessment - 08/05/21 1639     Subjective  S: I've been trying to work on my hand a lot this weekend.    Currently in Pain? No/denies                [facility] OT Assessment - 08/05/21 1639       Assessment   Medical Diagnosis S/P right MCA with left hemiparesis      Precautions   Precautions Fall    Precaution Comments left side hemiparesis, left side visual field deficits                      OT Treatments/Exercises (OP) - 08/05/21 1639       Transfers   Transfers Sit to Stand;Stand to Sit    Sit to Stand 4: Min guard    Stand to Sit 4: Min guard      Neurological Re-education Exercises   Other Exercises  1 standing at tray table. used left hand to grasp and pick up sguigz. 3 Squigz picked up with min assist from OT for proper form and technique.    Weight Bearing Position Seated    Seated with weight on hand seated on EOM, used RUE to pick up and reach towards the left side to place Squigz on floor length mirror. Focused on weightbearing into hand and/or forearm. verbal and physical cues provided for form and technique. Removed Squigz with reverse movement once placed on mirror.                      OT Short Term Goals - 07/09/21 1343       OT SHORT TERM GOAL #1   Title Patient will be educated and independent with HEP completion in order to faciliate his progress in therapy and begin to use his LUE as a gross assist for 25% or more of daily tasks.    Time 6    Period Weeks    Target Date 06/25/21       OT SHORT TERM GOAL #2   Title Patient will demonstrate a 25% increase in A/ROM of the LUE (shoulder, elbow, wrist) as able while using his LUE as a gross assist when completing self care tasks such as dressing.    Time 6    Period Weeks    Status On-going      OT SHORT TERM GOAL #3   Title Patient will demonstrate increased fine motor coordination in his left hand by completing the 9 hole peg test with peg set-up if needed.    Baseline 8/8: Unable to complete even with ste up provided.    Time 6    Period Weeks    Status On-going      OT SHORT TERM GOAL #4   Title Patient will increase is left hand strength to 10# and his pinch strength to 5# in order to begin to use his left hand to stabilize objects when completing self feeding.    Baseline 8/8: grip strength: 5#, lateral pinch 2#, 3 point pinch: 0    Time 6    Period Weeks    Status On-going      OT SHORT TERM GOAL #5   Title Patient will completing bathing and dressing with Mod assist where needed while utilizing DME, Adaptive equipment, and/or compensatory techniques with improved sitting and standing balance.    Time 6    Period Weeks    Status Partially Met               OT Long Term Goals - 05/16/21 1415       OT LONG TERM GOAL #1   Title Patient will demonstrate improved LUE A/ROM by 50% or more while utilizing his LUE as his non dominant extremity to provide an active assist to complete tasks that require use of bilateral UE's.    Time 12    Period Weeks    Status On-going      OT LONG TERM GOAL #2   Title Patient will increase his LUE strength (shoulder, elbow, wrist) to 4/5 or better while demonstrating improved ability to complete bathing and dressing tasks.    Time 12    Period Weeks    Status On-going      OT LONG TERM GOAL #3   Title Patient will increase his fine motor coordination in his left hand by completing the 9 hole peg test in  the standardized fashion under 1'.    Time 12    Period  Weeks    Status On-going      OT LONG TERM GOAL #4   Title Patient will demonstrate improved left hand grip strength to 15# and pinch strength to 8# in order to manipulate and hold onto items in his left hand without dropping.    Time 12    Period Weeks    Status On-going      OT LONG TERM GOAL #5   Title Patient will completing bathing and dressing with min assist where needed while utilizing DME, Adaptive equipment, and/or compensatory techniques with improved sitting and standing balance.    Status On-going                   Plan - 08/05/21 1647     Clinical Impression Statement A: Continued with weightbearing onto LUE forearm and/or hand. Continues to require VC for form and technique. Provided physical assist as well. Required assist to move UE into forearm pronation versus a nuetral position when completing grasp and release task.    Body Structure / Function / Physical Skills ADL;Decreased knowledge of use of DME;Strength;Tone;GMC;Balance;UE functional use;Vision;ROM;IADL;Sensation;Coordination;FMC    Plan P: reassessment.    Consulted and Agree with Plan of Care Patient             Patient will benefit from skilled therapeutic intervention in order to improve the following deficits and impairments:   Body Structure / Function / Physical Skills: ADL, Decreased knowledge of use of DME, Strength, Tone, GMC, Balance, UE functional use, Vision, ROM, IADL, Sensation, Coordination, [facility]       Visit Diagnosis: Other symptoms and signs involving the musculoskeletal system  Stiffness of left hand, not elsewhere classified  Other lack of coordination  Stiffness of left elbow, not elsewhere classified    Problem List Patient Active Problem List   Diagnosis Date Noted   Urinary frequency    Dysphagia, post-stroke    Spastic hemiplegia affecting nondominant side (HCC)    Drug-induced hypotension    Pressure injury of skin 02/14/2021   Right middle cerebral  artery stroke ([place]) 02/12/2021   Dyslipidemia    Hemiparesis affecting left side as late effect of stroke ([place])    Middle cerebral artery embolism, right 02/03/2021   Status post surgery 02/02/2021   Stroke (cerebrum) ([place]) 02/02/2021   Hypoglycemia 01/25/2021   Thyroid nodule 01/25/2021   Hypothyroidism 01/25/2021   Near syncope 01/21/2021   H/O adenomatous polyp of colon 12/30/2020   Acute ischemic stroke ([place]) 11/28/2020   Essential hypertension 11/28/2020   Mixed hyperlipidemia 11/28/2020   Acute CVA (cerebrovascular accident) ([place]) 09/09/2020   Transient ischemic attack 09/07/2020   History of hypertension 09/07/2020   Elevated troponin 09/07/2020   Dizziness 09/07/2020   Numbness and tingling 09/07/2020   H/O medication noncompliance 09/07/2020   Medicare annual wellness visit, subsequent 12/05/2019   Nicotine dependence 12/05/2019   Chronic kidney disease, stage 3 unspecified ([place]) 10/24/2019   Prediabetes 10/24/2019   Edema of foot 09/14/2017   Tubular adenoma of colon 09/14/2017   Long term (current) use of anticoagulants 11/14/2014   Sickle cell trait ([place]) 08/29/2012   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

## 2021-08-07 ENCOUNTER — Ambulatory Visit (HOSPITAL_COMMUNITY): Payer: Medicare Other | Admitting: Physical Therapy

## 2021-08-07 ENCOUNTER — Ambulatory Visit (HOSPITAL_COMMUNITY): Payer: Medicare Other

## 2021-08-07 ENCOUNTER — Encounter (HOSPITAL_COMMUNITY): Payer: Self-pay | Admitting: Physical Therapy

## 2021-08-07 ENCOUNTER — Other Ambulatory Visit: Payer: Self-pay

## 2021-08-07 DIAGNOSIS — M6281 Muscle weakness (generalized): Secondary | ICD-10-CM | POA: Diagnosis not present

## 2021-08-07 DIAGNOSIS — R262 Difficulty in walking, not elsewhere classified: Secondary | ICD-10-CM

## 2021-08-07 DIAGNOSIS — R29898 Other symptoms and signs involving the musculoskeletal system: Secondary | ICD-10-CM

## 2021-08-07 DIAGNOSIS — R278 Other lack of coordination: Secondary | ICD-10-CM | POA: Diagnosis not present

## 2021-08-07 DIAGNOSIS — M25642 Stiffness of left hand, not elsewhere classified: Secondary | ICD-10-CM

## 2021-08-07 DIAGNOSIS — M25622 Stiffness of left elbow, not elsewhere classified: Secondary | ICD-10-CM | POA: Diagnosis not present

## 2021-08-07 DIAGNOSIS — R2689 Other abnormalities of gait and mobility: Secondary | ICD-10-CM

## 2021-08-07 NOTE — Therapy (Signed)
[place], [place], [address] Phone: [phone]   Fax:  [phone]  Physical Therapy Treatment  Patient Details  Name: Matthew Wise MRN: [id number] Date of Birth: 12-24-1960 Referring Provider (PT): Alysia Penna MD   Encounter Date: 08/07/2021   PT End of Session - 08/07/21 1354     Visit Number 20    Number of Visits 23    Date for PT Re-Evaluation 08/13/21    Authorization Type U[employer] M[employer]    Progress Note Due on Visit 23    PT Start Time 1350    PT Stop Time 1428    PT Time Calculation (min) 38 min    Equipment Utilized During Treatment Gait belt   WC behind   Activity Tolerance Patient tolerated treatment well;Patient limited by fatigue    Behavior During Therapy [facility] for tasks assessed/performed             Past Medical History:  Diagnosis Date   Acute CVA (cerebrovascular accident) ([place]) 09/09/2020   Chronic anticoagulation 11/14/2014   CKD (chronic kidney disease) stage 3, GFR 30-59 ml/min ([place]) 10/24/2019   CKD (chronic kidney disease) stage 3, GFR 30-59 ml/min ([place]) 10/24/2019   Clotting disorder ([place])    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 two simple tubular adenomas removed. patient was advised to go to [facility] for colonoscopy but he did not keep appt.   IR CT HEAD LTD  02/02/2021   IR PERCUTANEOUS ART THROMBECTOMY/INFUSION INTRACRANIAL INC DIAG ANGIO  02/02/2021    RADIOLOGY WITH ANESTHESIA N/A 02/02/2021   Procedure: IR WITH ANESTHESIA;  Surgeon: Luanne Bras, MD;  Location: [facility];  Service: Radiology;  Laterality: N/A;   TEE WITHOUT CARDIOVERSION N/A 09/10/2020   Procedure: TRANSESOPHAGEAL ECHOCARDIOGRAM (TEE) WITH PROPOFOL;  Surgeon: Satira Sark, MD;  Location: [facility];  Service: Cardiovascular;  Laterality: N/A;    There were no vitals filed for this visit.   Subjective Assessment - 08/07/21 1352     Subjective Nothing new to report.    Pertinent History CVA 02/02/21    Patient Stated Goals Be able to get up and go without walker    Currently in Pain? No/denies                               [facility] Adult PT Treatment/Exercise - 08/07/21 0001       Knee/Hip Exercises: Standing   Gait Training 125 feet using HW and WC follow rest break x 1    Other Standing Knee Exercises step taps on 6 inch box with HHA 2 x 10 LLE with active assist      Knee/Hip Exercises: Seated   Sit to Sand 10 reps   with active assist for LT weight shift  PT Short Term Goals - 06/25/21 1638       PT SHORT TERM GOAL #1   Title Patient will be independent with initial HEP and self-management strategies to improve functional outcomes    Time 4    Period Weeks    Status On-going    Target Date 06/19/21      PT SHORT TERM GOAL #2   Title Patient will be Min A with transfers and bed mobility    Time 4    Period Weeks    Status Achieved    Target Date 06/19/21               PT Long Term Goals - 07/16/21 1725       PT LONG TERM GOAL #1   Title Patient will be Mod I with bed mobility and transfers    Baseline Mod I with all except sit to supine. Does require multiple attempts on sit to stand    Time 8    Period Weeks    Status Partially Met      PT LONG TERM GOAL #2   Title Patient will be able to ambulate >100 feet with LRAD for improved household mobility    Baseline Current 115 feet  with HW    Time 8    Period Weeks    Status Achieved      PT LONG TERM GOAL #3   Title Patient will have at least 4/5 MMT throughout LLE (except ankle DF) for improved functional mobility and stair/ step navigation.    Baseline See MMT    Time 8    Period Weeks    Status On-going                   Plan - 08/07/21 1544     Clinical Impression Statement Continued working on improved gait and sequencing. Patient demos increased fatigue levels today moving more slowly and requiring increased cues as well as rest breaks with activity. Educated patient on importance of initiating swing phase from hip to improve efficiency of stride, taking larger step and reducing fatigue as well as risk for LOB. Received signed order for AFO. Educated patient on process and issued phone number and address for several DME offices in the area to schedule orthotic consult.    Examination-Activity Limitations Caring for Others;Transfers;Locomotion Level;Toileting;Stand    Examination-Participation Restrictions Yard Work;Cleaning;Laundry;Community Activity    Stability/Clinical Decision Making Evolving/Moderate complexity    Rehab Potential Fair    PT Frequency 2x / week    PT Duration 4 weeks    PT Treatment/Interventions ADLs/Self Care Home Management;Aquatic Therapy;Biofeedback;Cryotherapy;Fluidtherapy;Contrast Bath;Electrical Stimulation;Therapeutic exercise;Orthotic Fit/Training;Patient/family education;Manual lymph drainage;Manual techniques;Dry needling;Energy conservation;Spinal Manipulations;Joint Manipulations;Splinting;Taping;Compression bandaging;Vasopneumatic Device;Therapeutic activities;Parrafin;Ultrasound;Traction;Functional mobility training;Neuromuscular re-education;Stair training;Gait training;Moist Heat;Iontophoresis 34m/ml Dexamethasone;DME Instruction;Balance training;Passive range of motion;Scar mobilization;Wheelchair mobility training;Visual/perceptual remediation/compensation    PT  Next Visit Plan Progress functional mobility as tolerated. Encourage WB through LT side. Gait. F/U about AFO fitting    PT Home Exercise Plan Eval: marches, LAQs, heel/toe raise; 7/1:  bridge and sidelying clam 7/12 heel slide, supine hip abduction; tall sitting with lumbar support    Consulted and Agree with Plan of Care Patient             Patient will benefit from skilled therapeutic intervention in order to improve the following deficits and impairments:  Abnormal gait, Improper body mechanics, Impaired sensation, Decreased balance, Difficulty walking, Decreased activity tolerance, Decreased range of motion, Decreased strength, Hypomobility, Impaired tone, Decreased  mobility, Decreased coordination  Visit Diagnosis: Muscle weakness (generalized)  Difficulty in walking, not elsewhere classified  Other abnormalities of gait and mobility  Other symptoms and signs involving the musculoskeletal system     Problem List Patient Active Problem List   Diagnosis Date Noted   Urinary frequency    Dysphagia, post-stroke    Spastic hemiplegia affecting nondominant side (HCC)    Drug-induced hypotension    Pressure injury of skin 02/14/2021   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   Dyslipidemia    Hemiparesis affecting left side as late effect of stroke ([place])    Middle cerebral artery embolism, right 02/03/2021   Status post surgery 02/02/2021   Stroke (cerebrum) ([place]) 02/02/2021   Hypoglycemia 01/25/2021   Thyroid nodule 01/25/2021   Hypothyroidism 01/25/2021   Near syncope 01/21/2021   H/O adenomatous polyp of colon 12/30/2020   Acute ischemic stroke ([place]) 11/28/2020   Essential hypertension 11/28/2020   Mixed hyperlipidemia 11/28/2020   Acute CVA (cerebrovascular accident) ([place]) 09/09/2020   Transient ischemic attack 09/07/2020   History of hypertension 09/07/2020   Elevated troponin 09/07/2020   Dizziness 09/07/2020   Numbness and tingling 09/07/2020   H/O  medication noncompliance 09/07/2020   Medicare annual wellness visit, subsequent 12/05/2019   Nicotine dependence 12/05/2019   Chronic kidney disease, stage 3 unspecified ([place]) 10/24/2019   Prediabetes 10/24/2019   Edema of foot 09/14/2017   Tubular adenoma of colon 09/14/2017   Long term (current) use of anticoagulants 11/14/2014   Sickle cell trait ([place]) 08/29/2012   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 [place], [place], [address] Phone: [phone]   Fax:  [phone]  Name: Matthew Wise MRN: [id number] Date of Birth: [birth year]

## 2021-08-07 NOTE — Therapy (Signed)
[place], [place], [address] Phone: [phone]   Fax:  [phone]  Occupational Therapy Treatment Reassessment/re-cert Patient Details  Name: Matthew Wise MRN: [id number] Date of Birth: September 17, 1961 Referring Provider (OT): Alysia Penna, MD   Encounter Date: 08/07/2021   OT End of Session - 08/07/21 1506     Visit Number 22    Number of Visits 34    Date for OT Re-Evaluation 09/18/21    Authorization Type UHC medicare    Authorization Time Period 11/30/20-end of year. $30 copay. No visit limit    Progress Note Due on Visit 34    OT Start Time 1430   reassessment   OT Stop Time 1518    OT Time Calculation (min) 48 min    Activity Tolerance Patient tolerated treatment well    Behavior During Therapy WFL for tasks assessed/performed             Past Medical History:  Diagnosis Date   Acute CVA (cerebrovascular accident) ([place]) 09/09/2020   Chronic anticoagulation 11/14/2014   CKD (chronic kidney disease) stage 3, GFR 30-59 ml/min ([place]) 10/24/2019   CKD (chronic kidney disease) stage 3, GFR 30-59 ml/min ([place]) 10/24/2019   Clotting disorder ([place])    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 two simple tubular adenomas removed. patient was advised to go to [facility] for colonoscopy but he did not keep appt.   IR CT HEAD LTD  02/02/2021   IR PERCUTANEOUS ART THROMBECTOMY/INFUSION  INTRACRANIAL INC DIAG ANGIO  02/02/2021   RADIOLOGY WITH ANESTHESIA N/A 02/02/2021   Procedure: IR WITH ANESTHESIA;  Surgeon: Luanne Bras, MD;  Location: [facility];  Service: Radiology;  Laterality: N/A;   TEE WITHOUT CARDIOVERSION N/A 09/10/2020   Procedure: TRANSESOPHAGEAL ECHOCARDIOGRAM (TEE) WITH PROPOFOL;  Surgeon: Satira Sark, MD;  Location: [facility];  Service: Cardiovascular;  Laterality: N/A;    There were no vitals filed for this visit.   Subjective Assessment - 08/07/21 1541     Subjective  S: I try to walk as much as I can at home. if I don't walk I'm standing.    Currently in Pain? No/denies                [facility] OT Assessment - 08/07/21 1445       Assessment   Medical Diagnosis S/P right MCA with left hemiparesis    Onset Date/Surgical Date 02/02/21      Precautions   Precautions Fall    Precaution Comments left side hemiparesis, left side visual field deficits      Prior Function   Level of Independence Independent      Coordination   9 Hole Peg Test Left    Left 9 Hole Peg Test unable to complete although was able to hold onto the peg with the left hand when OT provided set-up. he was unable to complete this when previously tested.  ROM / Strength   AROM / PROM / Strength Strength;AROM      AROM   Overall AROM Comments Assessed seated. IR/er adducted.    AROM Assessment Site Elbow;Shoulder;Forearm;Wrist    Right/Left Shoulder Left    Left Shoulder Flexion 45 Degrees   20   Left Shoulder ABduction 45 Degrees   previous:32   Left Shoulder Internal Rotation 90 Degrees   prevoius:90   Left Shoulder External Rotation -9 Degrees   previous:-16   Right/Left Elbow Left    Left Elbow Flexion 126   previous: 120   Left Elbow Extension -10   previous: 0   Right/Left Forearm Left    Left Forearm Pronation 90 Degrees   previous: same   Left Forearm Supination 62 Degrees   previous: 52   Left Wrist Extension 22 Degrees   previous: 10   Left Wrist  Flexion 30 Degrees   previous: 46   Right/Left Finger Left    Left Composite Finger Extension 50%   previous: 75%   Left Composite Finger Flexion --   100% for middle-small. 75% for index finger. Previous: 75%     Strength   Overall Strength Comments grip and pinch strength completed with OT providing hand set up.    Strength Assessment Site Shoulder;Hand;Wrist;Forearm;Elbow    Right/Left Shoulder Left    Left Shoulder Flexion 3-/5   previous; 1/5   Left Shoulder ABduction 3-/5   previous: 0/5   Left Shoulder Internal Rotation 3/5   previous: 1/5   Left Shoulder External Rotation 3-/5   previous: 1/5   Right/Left Elbow Left    Left Elbow Flexion 3/5   previous: 3/5   Left Elbow Extension 3/5   previous: 3-/5   Right/Left Forearm Left    Left Forearm Pronation 3/5   previous: 3/5   Left Forearm Supination 3/5   previous: 3-/5   Right/Left Wrist Left    Left Wrist Flexion 3-/5   previous: 3-/5   Left Wrist Extension 3-/5   previous: 3-/5   Right/Left hand Left    Left Hand Grip (lbs) 7   previous: 5   Left Hand Lateral Pinch 2 lbs   previous: same   Left Hand 3 Point Pinch 2 lbs   with set-up and stabilization previous: 0                             OT Education - 08/07/21 1549     Education Details Provided a print out of two possible wheelchair cushions that would be recommended for purchase.    Person(s) Educated Patient    Methods Explanation;Handout    Comprehension Verbalized understanding              OT Short Term Goals - 08/07/21 1504       OT SHORT TERM GOAL #1   Title Patient will be educated and independent with HEP completion in order to faciliate his progress in therapy and begin to use his LUE as a gross assist for 25% or more of daily tasks.    Time 6    Period Weeks    Target Date 06/25/21      OT SHORT TERM GOAL #2   Title Patient will demonstrate a 25% increase in A/ROM of the LUE (shoulder, elbow, wrist) as able while using his  LUE as a gross assist when completing self care tasks such as dressing.    Time  6    Period Weeks    Status Achieved      OT SHORT TERM GOAL #3   Title Patient will demonstrate increased fine motor coordination in his left hand by completing the 9 hole peg test with peg set-up if needed.    Baseline 9/8: Unable to complete even with ste up provided.    Time 6    Period Weeks    Status On-going      OT SHORT TERM GOAL #4   Title Patient will increase is left hand strength to 10# and his pinch strength to 5# in order to begin to use his left hand to stabilize objects when completing self feeding.    Baseline 9/8: grip strength: 7#, lateral pinch 2#, 3 point pinch: 2#    Time 6    Period Weeks    Status On-going      OT SHORT TERM GOAL #5   Title Patient will completing bathing and dressing with Mod assist where needed while utilizing DME, Adaptive equipment, and/or compensatory techniques with improved sitting and standing balance.    Time 6    Period Weeks    Status Achieved               OT Long Term Goals - 05/16/21 1415       OT LONG TERM GOAL #1   Title Patient will demonstrate improved LUE A/ROM by 50% or more while utilizing his LUE as his non dominant extremity to provide an active assist to complete tasks that require use of bilateral UE's.    Time 12    Period Weeks    Status On-going      OT LONG TERM GOAL #2   Title Patient will increase his LUE strength (shoulder, elbow, wrist) to 4/5 or better while demonstrating improved ability to complete bathing and dressing tasks.    Time 12    Period Weeks    Status On-going      OT LONG TERM GOAL #3   Title Patient will increase his fine motor coordination in his left hand by completing the 9 hole peg test in the standardized fashion under 1'.    Time 12    Period Weeks    Status On-going      OT LONG TERM GOAL #4   Title Patient will demonstrate improved left hand grip strength to 15# and pinch strength to 8#  in order to manipulate and hold onto items in his left hand without dropping.    Time 12    Period Weeks    Status On-going      OT LONG TERM GOAL #5   Title Patient will completing bathing and dressing with min assist where needed while utilizing DME, Adaptive equipment, and/or compensatory techniques with improved sitting and standing balance.    Status On-going                   Plan - 08/07/21 1551     Clinical Impression Statement A: Reassessment completed this date. patien thas met 3/5 short term goals. He initially started OT 12 weeks ago with no movement and at today's reassessment he is able to demonstrate activement movement in his left shoulder, elbow, wrist,and hand. He is unable to demonstrate any functional use of his left UE at this time as he does not possess enough strength and control. His LUE fatigues very quickly. Frequently he is able to achieve one, maybe two repetitions of a full  fist and almost full extension of his hand. His index finger and thumb have limited functional use. Increased spasticity has been noted in his left elbow and shoulder recently which impededs his ability to progress the functional use of his left arm. Decreased left side awareness remains present although in a mild form. Patient was unable to afford the copay to receive a custom manual wheelchair and has been provided a nearly new wheelchair from a friend. It does provide him with the adequate support that he needs. He has been using a kitchen cushion versus a wheelchair cushion. Discussed purchasing a cushion. He has been provided the contact information for [employer] to receive a free shower chair. He has not contacted the company yet.    Body Structure / Function / Physical Skills ADL;Decreased knowledge of use of DME;Strength;Tone;GMC;Balance;UE functional use;Vision;ROM;IADL;Sensation;Coordination;FMC    OT Frequency 2x / week    OT Duration 6 weeks    Plan P: Continue skilled  OT services focusing on mentioned deficits above. Pt will have follow up visit with Dr. Letta Pate on 9/20.    Consulted and Agree with Plan of Care Patient             Patient will benefit from skilled therapeutic intervention in order to improve the following deficits and impairments:   Body Structure / Function / Physical Skills: ADL, Decreased knowledge of use of DME, Strength, Tone, GMC, Balance, UE functional use, Vision, ROM, IADL, Sensation, Coordination, [place]       Visit Diagnosis: Stiffness of left hand, not elsewhere classified - Plan: Ot plan of care cert/re-cert  Other symptoms and signs involving the musculoskeletal system - Plan: Ot plan of care cert/re-cert  Other lack of coordination - Plan: Ot plan of care cert/re-cert  Stiffness of left elbow, not elsewhere classified - Plan: Ot plan of care cert/re-cert    Problem List Patient Active Problem List   Diagnosis Date Noted   Urinary frequency    Dysphagia, post-stroke    Spastic hemiplegia affecting nondominant side (HCC)    Drug-induced hypotension    Pressure injury of skin 02/14/2021   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   Dyslipidemia    Hemiparesis affecting left side as late effect of stroke ([place])    Middle cerebral artery embolism, right 02/03/2021   Status post surgery 02/02/2021   Stroke (cerebrum) ([place]) 02/02/2021   Hypoglycemia 01/25/2021   Thyroid nodule 01/25/2021   Hypothyroidism 01/25/2021   Near syncope 01/21/2021   H/O adenomatous polyp of colon 12/30/2020   Acute ischemic stroke ([place]) 11/28/2020   Essential hypertension 11/28/2020   Mixed hyperlipidemia 11/28/2020   Acute CVA (cerebrovascular accident) ([place]) 09/09/2020   Transient ischemic attack 09/07/2020   History of hypertension 09/07/2020   Elevated troponin 09/07/2020   Dizziness 09/07/2020   Numbness and tingling 09/07/2020   H/O medication noncompliance 09/07/2020   Medicare annual wellness visit,  subsequent 12/05/2019   Nicotine dependence 12/05/2019   Chronic kidney disease, stage 3 unspecified ([place]) 10/24/2019   Prediabetes 10/24/2019   Edema of foot 09/14/2017   Tubular adenoma of colon 09/14/2017   Long term (current) use of anticoagulants 11/14/2014   Sickle cell trait ([place]) 08/29/2012   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

## 2021-08-12 ENCOUNTER — Encounter (HOSPITAL_COMMUNITY): Payer: Self-pay

## 2021-08-12 ENCOUNTER — Ambulatory Visit (HOSPITAL_COMMUNITY): Payer: Medicare Other

## 2021-08-12 ENCOUNTER — Other Ambulatory Visit: Payer: Self-pay

## 2021-08-12 DIAGNOSIS — R262 Difficulty in walking, not elsewhere classified: Secondary | ICD-10-CM

## 2021-08-12 DIAGNOSIS — M6281 Muscle weakness (generalized): Secondary | ICD-10-CM | POA: Diagnosis not present

## 2021-08-12 DIAGNOSIS — R278 Other lack of coordination: Secondary | ICD-10-CM | POA: Diagnosis not present

## 2021-08-12 DIAGNOSIS — M25622 Stiffness of left elbow, not elsewhere classified: Secondary | ICD-10-CM | POA: Diagnosis not present

## 2021-08-12 DIAGNOSIS — R2689 Other abnormalities of gait and mobility: Secondary | ICD-10-CM

## 2021-08-12 DIAGNOSIS — R29898 Other symptoms and signs involving the musculoskeletal system: Secondary | ICD-10-CM

## 2021-08-12 DIAGNOSIS — M25642 Stiffness of left hand, not elsewhere classified: Secondary | ICD-10-CM | POA: Diagnosis not present

## 2021-08-12 NOTE — Therapy (Signed)
[place], [place], [address] Phone: [phone]   Fax:  [phone]  Physical Therapy Treatment, Progress Note, and Recertification  Patient Details  Name: Matthew Wise MRN: [id number] Date of Birth: 1961-10-09 Referring Provider (PT): Alysia Penna MD  Progress Note Reporting Period 07/02/21 to 08/12/21  See note below for Objective Data and Assessment of Progress/Goals.     Encounter Date: 08/12/2021   PT End of Session - 08/12/21 1435     Visit Number 21    Number of Visits 31    Date for PT Re-Evaluation 09/09/21    Authorization Type [employer]    Progress Note Due on Visit 31    PT Start Time 1430    PT Stop Time 1515    PT Time Calculation (min) 45 min    Equipment Utilized During Treatment Gait belt   WC behind   Activity Tolerance Patient tolerated treatment well;Patient limited by fatigue    Behavior During Therapy WFL for tasks assessed/performed             Past Medical History:  Diagnosis Date   Acute CVA (cerebrovascular accident) ([place]) 09/09/2020   Chronic anticoagulation 11/14/2014   CKD (chronic kidney disease) stage 3, GFR 30-59 ml/min ([place]) 10/24/2019   CKD (chronic kidney disease) stage 3, GFR 30-59 ml/min ([place]) 10/24/2019   Clotting disorder ([place])    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 two simple tubular adenomas removed. patient was advised to  go to [facility] for colonoscopy but he did not keep appt.   IR CT HEAD LTD  02/02/2021   IR PERCUTANEOUS ART THROMBECTOMY/INFUSION INTRACRANIAL INC DIAG ANGIO  02/02/2021   RADIOLOGY WITH ANESTHESIA N/A 02/02/2021   Procedure: IR WITH ANESTHESIA;  Surgeon: Luanne Bras, MD;  Location: [facility];  Service: Radiology;  Laterality: N/A;   TEE WITHOUT CARDIOVERSION N/A 09/10/2020   Procedure: TRANSESOPHAGEAL ECHOCARDIOGRAM (TEE) WITH PROPOFOL;  Surgeon: Satira Sark, MD;  Location: [facility];  Service: Cardiovascular;  Laterality: N/A;    There were no vitals filed for this visit.   Subjective Assessment - 08/12/21 1435     Subjective Reports he has been working on walking and standing at home    Pertinent History CVA 02/02/21    Patient Stated Goals Be able to get up and go without walker                [facility] PT Assessment - 08/12/21 0001       Assessment   Medical Diagnosis S/P right MCA with left hemiparesis    Referring Provider (PT) Alysia Penna MD    Onset Date/Surgical Date 02/02/21      Precautions   Precautions Fall    Precaution Comments left side hemiparesis, left side visual field deficits      Prior Function   Level of Independence Independent      Strength   Right Knee  Flexion 5/5    Right Knee Extension 5/5    Left Knee Flexion 3-/5    Left Knee Extension 3/5    Left Ankle Dorsiflexion 1/5                           [facility] Adult PT Treatment/Exercise - 08/12/21 0001       Bed Mobility   Supine to Sit Minimal Assistance - Patient > 75%    Sit to Supine Minimal Assistance - Patient > 75%      Transfers   Sit to Stand 4: Min guard    Stand to Sit 4: Min guard      Ambulation/Gait   Ambulation/Gait Yes    Ambulation/Gait Assistance 4: Min guard    Ambulation Distance (Feet) 45 Feet    Assistive device Hemi-walker    Gait Pattern Step-to pattern;Decreased stride length;Left foot flat;Decreased dorsiflexion - left;Decreased stance time -  left;Decreased step length - right;Poor foot clearance - left    Ambulation Surface Level;Indoor      Knee/Hip Exercises: Supine   Short Arc Quad Sets Strengthening;Both;4 sets;10 reps    Short Arc Quad Sets Limitations 20 lbs RLE, 5 lbs LLE    Bridges Strengthening;Both;3 sets;10 reps    Bridges Limitations LLE facilitation    Other Supine Knee/Hip Exercises left hip flexion with green stability ball 3x10 reps                       PT Short Term Goals - 08/12/21 1527       PT SHORT TERM GOAL #1   Title Patient will be independent with initial HEP and self-management strategies to improve functional outcomes    Time 4    Period Weeks    Status On-going    Target Date 08/26/21      PT SHORT TERM GOAL #2   Title Patient will be Min A with transfers and bed mobility    Time 4    Period Weeks    Status Achieved    Target Date 06/19/21               PT Long Term Goals - 08/12/21 1528       PT LONG TERM GOAL #1   Title Patient will be Mod I with bed mobility and transfers    Baseline Min A bed mobility. CGA-supervision transfers    Time 8    Period Weeks    Status On-going      PT LONG TERM GOAL #2   Title Patient will be able to ambulate >100 feet with LRAD for improved household mobility    Baseline Current 115 feet with HW    Time 8    Period Weeks    Status Achieved      PT LONG TERM GOAL #3   Title Patient will have at least 4/5 MMT throughout LLE (except ankle DF) for improved functional mobility and stair/ step navigation.    Baseline See MMT    Time 8    Period Weeks    Status On-going    Target Date 09/09/21                   Plan - 08/12/21 1535     Clinical Impression Statement Demonstrates continued left ankle dorsiflexion weakness with difficulty in achieving requisite ground clearance when ambulating/transferring.  Pt would benefit from left ankle AFO intervention to  address weakness/foot drop to improve safety with  transfers/ambulation.  Continued sessions indicated to improve strength, motor control, and dynamic balance to enhance functional mobility and reduce risk for falls    Examination-Activity Limitations Caring for Others;Transfers;Locomotion Level;Toileting;Stand    Examination-Participation Restrictions Yard Work;Cleaning;Laundry;Community Activity    Stability/Clinical Decision Making Evolving/Moderate complexity    Rehab Potential Fair    PT Frequency 2x / week    PT Duration 4 weeks    PT Treatment/Interventions ADLs/Self Care Home Management;Aquatic Therapy;Biofeedback;Cryotherapy;Fluidtherapy;Contrast Bath;Electrical Stimulation;Therapeutic exercise;Orthotic Fit/Training;Patient/family education;Manual lymph drainage;Manual techniques;Dry needling;Energy conservation;Spinal Manipulations;Joint Manipulations;Splinting;Taping;Compression bandaging;Vasopneumatic Device;Therapeutic activities;Parrafin;Ultrasound;Traction;Functional mobility training;Neuromuscular re-education;Stair training;Gait training;Moist Heat;Iontophoresis '4mg'$ /ml Dexamethasone;DME Instruction;Balance training;Passive range of motion;Scar mobilization;Wheelchair mobility training;Visual/perceptual remediation/compensation    PT Next Visit Plan Progress functional mobility as tolerated. Encourage WB through LT side. Gait. F/U about AFO fitting    PT Home Exercise Plan Eval: marches, LAQs, heel/toe raise; 7/1:  bridge and sidelying clam 7/12 heel slide, supine hip abduction; tall sitting with lumbar support    Consulted and Agree with Plan of Care Patient             Patient will benefit from skilled therapeutic intervention in order to improve the following deficits and impairments:  Abnormal gait, Improper body mechanics, Impaired sensation, Decreased balance, Difficulty walking, Decreased activity tolerance, Decreased range of motion, Decreased strength, Hypomobility, Impaired tone, Decreased mobility, Decreased  coordination  Visit Diagnosis: Muscle weakness (generalized) - Plan: PT plan of care cert/re-cert  Difficulty in walking, not elsewhere classified - Plan: PT plan of care cert/re-cert  Other abnormalities of gait and mobility - Plan: PT plan of care cert/re-cert  Other symptoms and signs involving the musculoskeletal system - Plan: PT plan of care cert/re-cert     Problem List Patient Active Problem List   Diagnosis Date Noted   Urinary frequency    Dysphagia, post-stroke    Spastic hemiplegia affecting nondominant side (HCC)    Drug-induced hypotension    Pressure injury of skin 02/14/2021   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   Dyslipidemia    Hemiparesis affecting left side as late effect of stroke ([place])    Middle cerebral artery embolism, right 02/03/2021   Status post surgery 02/02/2021   Stroke (cerebrum) ([place]) 02/02/2021   Hypoglycemia 01/25/2021   Thyroid nodule 01/25/2021   Hypothyroidism 01/25/2021   Near syncope 01/21/2021   H/O adenomatous polyp of colon 12/30/2020   Acute ischemic stroke ([place]) 11/28/2020   Essential hypertension 11/28/2020   Mixed hyperlipidemia 11/28/2020   Acute CVA (cerebrovascular accident) ([place]) 09/09/2020   Transient ischemic attack 09/07/2020   History of hypertension 09/07/2020   Elevated troponin 09/07/2020   Dizziness 09/07/2020   Numbness and tingling 09/07/2020   H/O medication noncompliance 09/07/2020   Medicare annual wellness visit, subsequent 12/05/2019   Nicotine dependence 12/05/2019   Chronic kidney disease, stage 3 unspecified ([place]) 10/24/2019   Prediabetes 10/24/2019   Edema of foot 09/14/2017   Tubular adenoma of colon 09/14/2017   Long term (current) use of anticoagulants 11/14/2014   Sickle cell trait ([place]) 08/29/2012   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 Sherlyn Lees, PT, DPT Physical Therapist-  Office Number: [phone]   [facility] [address] [place], [place], [address] Phone: [phone]   Fax:  [phone]  Name: DEWARD SCHRYVER MRN: [id number] Date of Birth: Apr 19, 1961

## 2021-08-13 DIAGNOSIS — I69391 Dysphagia following cerebral infarction: Secondary | ICD-10-CM | POA: Diagnosis not present

## 2021-08-13 DIAGNOSIS — I952 Hypotension due to drugs: Secondary | ICD-10-CM | POA: Diagnosis not present

## 2021-08-13 DIAGNOSIS — R350 Frequency of micturition: Secondary | ICD-10-CM | POA: Diagnosis not present

## 2021-08-13 DIAGNOSIS — L8990 Pressure ulcer of unspecified site, unspecified stage: Secondary | ICD-10-CM | POA: Diagnosis not present

## 2021-08-13 DIAGNOSIS — I63511 Cerebral infarction due to unspecified occlusion or stenosis of right middle cerebral artery: Secondary | ICD-10-CM | POA: Diagnosis not present

## 2021-08-14 ENCOUNTER — Other Ambulatory Visit: Payer: Self-pay

## 2021-08-14 ENCOUNTER — Encounter (HOSPITAL_COMMUNITY): Payer: Self-pay | Admitting: Physical Therapy

## 2021-08-14 ENCOUNTER — Ambulatory Visit (HOSPITAL_COMMUNITY): Payer: Medicare Other | Admitting: Physical Therapy

## 2021-08-14 ENCOUNTER — Encounter (HOSPITAL_COMMUNITY): Payer: Self-pay

## 2021-08-14 ENCOUNTER — Ambulatory Visit (HOSPITAL_COMMUNITY): Payer: Medicare Other

## 2021-08-14 DIAGNOSIS — M6281 Muscle weakness (generalized): Secondary | ICD-10-CM

## 2021-08-14 DIAGNOSIS — R2689 Other abnormalities of gait and mobility: Secondary | ICD-10-CM | POA: Diagnosis not present

## 2021-08-14 DIAGNOSIS — R278 Other lack of coordination: Secondary | ICD-10-CM

## 2021-08-14 DIAGNOSIS — M25642 Stiffness of left hand, not elsewhere classified: Secondary | ICD-10-CM | POA: Diagnosis not present

## 2021-08-14 DIAGNOSIS — R262 Difficulty in walking, not elsewhere classified: Secondary | ICD-10-CM | POA: Diagnosis not present

## 2021-08-14 DIAGNOSIS — M25622 Stiffness of left elbow, not elsewhere classified: Secondary | ICD-10-CM | POA: Diagnosis not present

## 2021-08-14 DIAGNOSIS — R29898 Other symptoms and signs involving the musculoskeletal system: Secondary | ICD-10-CM

## 2021-08-14 NOTE — Therapy (Signed)
[place], [place], [address] Phone: [phone]   Fax:  [phone]  Occupational Therapy Treatment  Patient Details  Name: Matthew Wise MRN: [id number] Date of Birth: 08/05/1961 Referring Provider (OT): Alysia Penna, MD   Encounter Date: 08/14/2021   OT End of Session - 08/14/21 1559     Visit Number 23    Number of Visits 34    Date for OT Re-Evaluation 09/18/21    Authorization Type UHC medicare    Authorization Time Period 11/30/20-end of year. $30 copay. 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 two simple tubular adenomas removed. patient was advised to go to [facility] for colonoscopy but he did not keep appt.   IR CT HEAD LTD  02/02/2021   IR PERCUTANEOUS ART THROMBECTOMY/INFUSION INTRACRANIAL INC DIAG ANGIO  02/02/2021    RADIOLOGY WITH ANESTHESIA N/A 02/02/2021   Procedure: IR WITH ANESTHESIA;  Surgeon: Luanne Bras, MD;  Location: [facility];  Service: Radiology;  Laterality: N/A;   TEE WITHOUT CARDIOVERSION N/A 09/10/2020   Procedure: TRANSESOPHAGEAL ECHOCARDIOGRAM (TEE) WITH PROPOFOL;  Surgeon: Satira Sark, MD;  Location: [facility];  Service: Cardiovascular;  Laterality: N/A;    There were no vitals filed for this visit.   Subjective Assessment - 08/14/21 1555     Subjective  S: Pt reports that he just received a wheelchair cushion today and he's using it for the first time.    Currently in Pain? No/denies                [facility] OT Assessment - 08/14/21 1556       Assessment   Medical Diagnosis S/P right MCA with left hemiparesis      Precautions   Precautions Fall    Precaution Comments left side hemiparesis, left side visual field deficits                      OT Treatments/Exercises (OP) - 08/14/21 1556       Neurological Re-education Exercises   Grasp and Release Thumb Opposition    Other Grasp and Release Exercises  seated. while using NMES unit, patient completed active assisted digit composite flexion with unit and digit composite extension with  unit (both occured during on ramp) during off ramp patient completed either active finger flexion or extension.    Other Grasp and Release Exercises  grasp and release task completed at table top using sponges. focused on thumb and index finger pinch and extension to release.      Modalities   Modalities Teacher, English as a foreign language Location left wrist flexors and extensors    Warden/ranger Parameters small EMS unit used. extensors: 5, flexors: 6    Electrical Stimulation Goals Neuromuscular facilitation                      OT Short Term Goals - 08/07/21 1504       OT SHORT TERM GOAL #1   Title Patient will  be educated and independent with HEP completion in order to faciliate his progress in therapy and begin to use his LUE as a gross assist for 25% or more of daily tasks.    Time 6    Period Weeks    Target Date 06/25/21      OT SHORT TERM GOAL #2   Title Patient will demonstrate a 25% increase in A/ROM of the LUE (shoulder, elbow, wrist) as able while using his LUE as a gross assist when completing self care tasks such as dressing.    Time 6    Period Weeks    Status Achieved      OT SHORT TERM GOAL #3   Title Patient will demonstrate increased fine motor coordination in his left hand by completing the 9 hole peg test with peg set-up if needed.    Baseline 9/8: Unable to complete even with ste up provided.    Time 6    Period Weeks    Status On-going      OT SHORT TERM GOAL #4   Title Patient will increase is left hand strength to 10# and his pinch strength to 5# in order to begin to use his left hand to stabilize objects when completing self feeding.    Baseline 9/8: grip strength: 7#, lateral pinch 2#, 3 point pinch: 2#    Time 6    Period Weeks    Status On-going      OT SHORT TERM GOAL #5   Title Patient will completing bathing and dressing with Mod assist where needed while utilizing DME, Adaptive equipment, and/or compensatory techniques with improved sitting and standing balance.    Time 6    Period Weeks    Status Achieved               OT Long Term Goals - 05/16/21 1415       OT LONG TERM GOAL #1   Title Patient will demonstrate improved LUE A/ROM by 50% or more while utilizing his LUE as his non dominant extremity to provide an active assist to complete tasks that require use of bilateral UE's.    Time 12    Period Weeks    Status On-going      OT LONG TERM GOAL #2   Title Patient will increase his LUE strength (shoulder, elbow, wrist) to 4/5 or better while demonstrating improved ability to complete bathing and dressing tasks.    Time 12    Period Weeks     Status On-going      OT LONG TERM GOAL #3   Title Patient will increase his  fine motor coordination in his left hand by completing the 9 hole peg test in the standardized fashion under 1'.    Time 12    Period Weeks    Status On-going      OT LONG TERM GOAL #4   Title Patient will demonstrate improved left hand grip strength to 15# and pinch strength to 8# in order to manipulate and hold onto items in his left hand without dropping.    Time 12    Period Weeks    Status On-going      OT LONG TERM GOAL #5   Title Patient will completing bathing and dressing with min assist where needed while utilizing DME, Adaptive equipment, and/or compensatory techniques with improved sitting and standing balance.    Status On-going                   Plan - 08/14/21 1621     Clinical Impression Statement A: Focused session on use of NMES unit to provide active assist and muscle strengthening. Patient demonstrated progress with index and thumb (2 point pinch) flexion while holding onto sponges. Only able to demonstrate once ability to extend thumb and index finger in order to grasp sponge. Increased tone noted in left wrist and elbow with resistance noted during passive elbow and wrist extension.    Body Structure / Function / Physical Skills ADL;Decreased knowledge of use of DME;Strength;Tone;GMC;Balance;UE functional use;Vision;ROM;IADL;Sensation;Coordination;FMC    Plan P: Follow up on MD appointment. Continue with use of NMES unit to left hand for wrist and finger extension and flexion    Consulted and Agree with Plan of Care Patient             Patient will benefit from skilled therapeutic intervention in order to improve the following deficits and impairments:   Body Structure / Function / Physical Skills: ADL, Decreased knowledge of use of DME, Strength, Tone, GMC, Balance, UE functional use, Vision, ROM, IADL, Sensation, Coordination, [facility]       Visit Diagnosis: Stiffness of  left elbow, not elsewhere classified  Stiffness of left hand, not elsewhere classified  Other lack of coordination  Other symptoms and signs involving the musculoskeletal system    Problem List Patient Active Problem List   Diagnosis Date Noted   Urinary frequency    Dysphagia, post-stroke    Spastic hemiplegia affecting nondominant side (HCC)    Drug-induced hypotension    Pressure injury of skin 02/14/2021   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   Dyslipidemia    Hemiparesis affecting left side as late effect of stroke ([place])    Middle cerebral artery embolism, right 02/03/2021   Status post surgery 02/02/2021   Stroke (cerebrum) ([place]) 02/02/2021   Hypoglycemia 01/25/2021   Thyroid nodule 01/25/2021   Hypothyroidism 01/25/2021   Near syncope 01/21/2021   H/O adenomatous polyp of colon 12/30/2020   Acute ischemic stroke ([place]) 11/28/2020   Essential hypertension 11/28/2020   Mixed hyperlipidemia 11/28/2020   Acute CVA (cerebrovascular accident) ([place]) 09/09/2020   Transient ischemic attack 09/07/2020   History of hypertension 09/07/2020   Elevated troponin 09/07/2020   Dizziness 09/07/2020   Numbness and tingling 09/07/2020   H/O medication noncompliance 09/07/2020   Medicare annual wellness visit, subsequent 12/05/2019   Nicotine dependence 12/05/2019   Chronic kidney disease, stage 3 unspecified ([place]) 10/24/2019   Prediabetes 10/24/2019   Edema of foot 09/14/2017   Tubular adenoma of colon 09/14/2017   Long term (current) use of  anticoagulants 11/14/2014   Sickle cell trait ([place]) 08/29/2012   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 [place], [place], [address] Phone: [phone]   Fax:  [phone]  Name: Matthew Wise MRN: [id number] Date of Birth: 01-12-1961

## 2021-08-14 NOTE — Therapy (Signed)
[facility] [address] [place], [place], [address] Phone: [phone]   Fax:  [phone]  Physical Therapy Treatment  Patient Details  Name: Matthew Wise MRN: [id number] Date of Birth: 1961-02-03 Referring Provider (PT): Alysia Penna MD   Encounter Date: 08/14/2021   PT End of Session - 08/14/21 1308     Visit Number 22    Number of Visits 31    Date for PT Re-Evaluation 09/09/21    Authorization Type [employer]    Progress Note Due on Visit 31    PT Start Time 1304    PT Stop Time 1353    PT Time Calculation (min) 49 min    Equipment Utilized During Treatment Gait belt   WC behind   Activity Tolerance Patient tolerated treatment well    Behavior During Therapy [facility] for tasks assessed/performed             Past Medical History:  Diagnosis Date   Acute CVA (cerebrovascular accident) ([place]) 09/09/2020   Chronic anticoagulation 11/14/2014   CKD (chronic kidney disease) stage 3, GFR 30-59 ml/min ([place]) 10/24/2019   CKD (chronic kidney disease) stage 3, GFR 30-59 ml/min ([place]) 10/24/2019   Clotting disorder ([place])    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 two simple tubular adenomas removed. patient was advised to go to [facility] for colonoscopy but he did not keep appt.   IR CT HEAD LTD  02/02/2021   IR PERCUTANEOUS ART THROMBECTOMY/INFUSION INTRACRANIAL INC DIAG ANGIO  02/02/2021   RADIOLOGY WITH  ANESTHESIA N/A 02/02/2021   Procedure: IR WITH ANESTHESIA;  Surgeon: Luanne Bras, MD;  Location: [facility];  Service: Radiology;  Laterality: N/A;   TEE WITHOUT CARDIOVERSION N/A 09/10/2020   Procedure: TRANSESOPHAGEAL ECHOCARDIOGRAM (TEE) WITH PROPOFOL;  Surgeon: Satira Sark, MD;  Location: [facility];  Service: Cardiovascular;  Laterality: N/A;    There were no vitals filed for this visit.   Subjective Assessment - 08/14/21 1307     Subjective No new issues reported. He got a new chair cushion today. Feels a little better with this. Meets with MD next week.    Pertinent History CVA 02/02/21    Patient Stated Goals Be able to get up and go without walker    Currently in Pain? No/denies                               [facility] Adult PT Treatment/Exercise - 08/14/21 0001       Knee/Hip Exercises: Standing   Gait Training 120 feet using HW and WC follow    Other Standing Knee Exercises march with 2lb ankle weights x 20   used 1lb wand as target for knee (improved flexion)   Other Standing Knee Exercises step taps on 6 inch box with HHA using 2lb ankle weight, retro walk in // bars 5 RT FWD/ Back HHA  x 1      Knee/Hip Exercises: Seated   Sit to Sand 10 reps   with active assist for LT weight shift                      PT Short Term Goals - 08/12/21 1527       PT SHORT TERM GOAL #1   Title Patient will be independent with initial HEP and self-management strategies to improve functional outcomes    Time 4    Period Weeks    Status On-going    Target Date 08/26/21      PT SHORT TERM GOAL #2   Title Patient will be Min A with transfers and bed mobility    Time 4    Period Weeks    Status Achieved    Target Date 06/19/21               PT Long Term Goals - 08/12/21 1528       PT LONG TERM GOAL #1   Title Patient will be Mod I with bed mobility and transfers    Baseline Min A bed mobility. CGA-supervision transfers    Time 8    Period  Weeks    Status On-going      PT LONG TERM GOAL #2   Title Patient will be able to ambulate >100 feet with LRAD for improved household mobility    Baseline Current 115 feet with HW    Time 8    Period Weeks    Status Achieved      PT LONG TERM GOAL #3   Title Patient will have at least 4/5 MMT throughout LLE (except ankle DF) for improved functional mobility and stair/ step navigation.    Baseline See MMT    Time 8    Period Weeks    Status On-going    Target Date 09/09/21                   Plan - 08/14/21 1419     Clinical Impression Statement Patient showing improved active LT hip flexion with functional activity today. Performed marching with target taps, using ankle weights for improved facilitation. Patient shows some improved carry over with cueing to avoid LLE external rotation with forward and backward stepping during gait training. Somewhat improved gait speed during gait training today as a result of improved LT hip flexion during swing. Would benefit from AFO to control ankle DF. Patient has referral, and was issued list of phone numbers for DME services in the area. He says he will talk with his sister and schedule an appointment at the location that is most convenient.    Examination-Activity Limitations Caring for Others;Transfers;Locomotion Level;Toileting;Stand    Examination-Participation Restrictions Yard Work;Cleaning;Laundry;Community Activity    Stability/Clinical Decision Making Evolving/Moderate complexity    Rehab Potential Fair    PT Frequency 2x / week    PT Duration 4 weeks    PT Treatment/Interventions ADLs/Self Care Home Management;Aquatic Therapy;Biofeedback;Cryotherapy;Fluidtherapy;Contrast Bath;Electrical Stimulation;Therapeutic exercise;Orthotic Fit/Training;Patient/family education;Manual lymph drainage;Manual techniques;Dry needling;Energy conservation;Spinal Manipulations;Joint Manipulations;Splinting;Taping;Compression bandaging;Vasopneumatic  Device;Therapeutic activities;Parrafin;Ultrasound;Traction;Functional mobility training;Neuromuscular re-education;Stair training;Gait training;Moist Heat;Iontophoresis '4mg'$ /ml Dexamethasone;DME Instruction;Balance training;Passive range of motion;Scar mobilization;Wheelchair mobility training;Visual/perceptual remediation/compensation    PT Next Visit Plan Progress functional mobility as tolerated. Encourage WB through LT side. Gait. F/U about AFO fitting    PT Home Exercise Plan Eval: marches, LAQs, heel/toe raise; 7/1:  bridge and sidelying clam 7/12 heel slide, supine hip abduction; tall sitting with  lumbar support    Consulted and Agree with Plan of Care Patient             Patient will benefit from skilled therapeutic intervention in order to improve the following deficits and impairments:  Abnormal gait, Improper body mechanics, Impaired sensation, Decreased balance, Difficulty walking, Decreased activity tolerance, Decreased range of motion, Decreased strength, Hypomobility, Impaired tone, Decreased mobility, Decreased coordination  Visit Diagnosis: Muscle weakness (generalized)  Difficulty in walking, not elsewhere classified  Other abnormalities of gait and mobility  Other symptoms and signs involving the musculoskeletal system     Problem List Patient Active Problem List   Diagnosis Date Noted   Urinary frequency    Dysphagia, post-stroke    Spastic hemiplegia affecting nondominant side (HCC)    Drug-induced hypotension    Pressure injury of skin 02/14/2021   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   Dyslipidemia    Hemiparesis affecting left side as late effect of stroke ([place])    Middle cerebral artery embolism, right 02/03/2021   Status post surgery 02/02/2021   Stroke (cerebrum) ([place]) 02/02/2021   Hypoglycemia 01/25/2021   Thyroid nodule 01/25/2021   Hypothyroidism 01/25/2021   Near syncope 01/21/2021   H/O adenomatous polyp of colon 12/30/2020    Acute ischemic stroke ([place]) 11/28/2020   Essential hypertension 11/28/2020   Mixed hyperlipidemia 11/28/2020   Acute CVA (cerebrovascular accident) ([place]) 09/09/2020   Transient ischemic attack 09/07/2020   History of hypertension 09/07/2020   Elevated troponin 09/07/2020   Dizziness 09/07/2020   Numbness and tingling 09/07/2020   H/O medication noncompliance 09/07/2020   Medicare annual wellness visit, subsequent 12/05/2019   Nicotine dependence 12/05/2019   Chronic kidney disease, stage 3 unspecified ([place]) 10/24/2019   Prediabetes 10/24/2019   Edema of foot 09/14/2017   Tubular adenoma of colon 09/14/2017   Long term (current) use of anticoagulants 11/14/2014   Sickle cell trait ([place]) 08/29/2012   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

## 2021-08-19 ENCOUNTER — Encounter: Payer: Self-pay | Admitting: Physical Medicine & Rehabilitation

## 2021-08-19 ENCOUNTER — Encounter: Payer: Medicare Other | Attending: Registered Nurse | Admitting: Physical Medicine & Rehabilitation

## 2021-08-19 ENCOUNTER — Other Ambulatory Visit: Payer: Self-pay

## 2021-08-19 VITALS — BP 110/67 | HR 78 | Temp 99.0°F | Ht 72.0 in

## 2021-08-19 DIAGNOSIS — G8110 Spastic hemiplegia affecting unspecified side: Secondary | ICD-10-CM | POA: Insufficient documentation

## 2021-08-19 NOTE — Progress Notes (Signed)
Subjective:    Patient ID: Matthew Wise, male    DOB: 05/30/1961, 60 y.o.   MRN: [id number] 60 y.o. right-handed male with history of pulmonary emboli maintained on Eliquis, hypertension, CKD stage III, hyperlipidemia, prediabetes, sickle cell disease, tobacco abuse as well as history of right MCA territory infarction admission 01/21/2019 until 01/27/2021 and discharged home ambulating 150 feet rolling walker.  Patient lives alone 1 level home 3 steps to entry.  Independent with assistive device.  He has a sister who checks on him regularly.  Presented 02/03/2021 with left hemiparesis, subacute onset.  Cranial CT scan unremarkable for acute intracranial process.  Suspect acute infarct of the right frontal parietal lobe posterior superior to chronic infarct with probable involvement of lateral precentral and postcentral gyri.  Patient did not receive TPA.  CT angiogram of head and neck showed abrupt occlusion of the right M1 segment new as compared to prior CTA of 01/25/2021.  Acute core infarct of the posterior right frontal parietal region largely corresponding with a completed infarct as seen on prior MRI.  EEG evidence of focal seizure arising from the right posterior quadrant during which patient was noted to have left upper extremity twitching.  Additionally there was cortical dysfunction in the right frontal temporal region likely due to underlying stroke.  Most recent echocardiogram with ejection fraction of 55 to 60%.  Admission chemistries unremarkable except creatinine 1.83 urine drug screen negative hemoglobin A1c 5.9 alcohol negative.  Patient underwent arteriogram with revascularization of right MCA M1 segment per interventional radiology.  Latest follow-up MRI showed large acute right MCA territory infarct with petechial hemorrhage and minimal leftward midline shift as well as a small acute left cerebellar infarct new small acute early subacute infarct in left occipital left parietal left frontal  lobes.  Bilateral extremity Dopplers finding consistent with age-indeterminate DVT involving the right common femoral vein and right femoral vein as well as left femoral vein and left popliteal vein, left common femoral and SFA junction.  Patient was placed on intravenous heparin remained on low-dose aspirin and Pradaxa was added.  He had been loaded with Keppra for suspected seizure.  Hospital course further complicated by dysphagia currently on a mechanical soft diet.  Admit date: 02/12/2021 Discharge date: 03/12/2021 HPI Left hemiparesis affecting upper and lower limb persists still has some sensory deficits.  Is elevated tone mainly in the upper extremity.  Working with outpatient PT OT, recommending left AFO.  The patient also has a left left upper toe cap which is helpful. Some helps transport patient. Pain Inventory Average Pain 0 Pain Right Now 0 My pain is constant and left arm feels tight  LOCATION OF PAIN  left arm  BOWEL Number of stools per week: 3-4 Oral laxative use Yes  Type of laxative Miralax Enema or suppository use No  History of colostomy No  Incontinent No   BLADDER Normal I Mobility walk with assistance use a walker how many minutes can you walk? 10 minutes with a walker ability to climb steps?  no do you drive?  no use a wheelchair transfers alone Do you have any goals in this area?  yes  Function disabled: date disabled Maybe 5 years ago I need assistance with the following:  dressing, bathing, toileting, meal prep, household duties, and shopping Do you have any goals in this area?  yes  Neuro/Psych weakness trouble walking depression  Prior Studies Any changes since last visit?  no  Physicians involved in your care Any  changes since last visit?  no   Family History  Problem Relation Age of Onset   COPD Father    Stroke Brother    Pulmonary embolism Sister    Throat cancer Brother    Arthritis Mother    Diabetes Mother    Stroke Mother     Hypertension Brother    Diabetes Brother    Arthritis Brother    Diabetes Sister    Colon cancer Neg Hx    Social History   Socioeconomic History   Marital status: Divorced    Spouse name: Not on file   Number of children: 2   Years of education: 12   Highest education level: Not on file  Occupational History   Occupation: disability    Comment: DVT  Tobacco Use   Smoking status: Former    Packs/day: 1.00    Years: 30.00    Pack years: 30.00    Types: Cigarettes    Start date: 12/01/1983   Smokeless tobacco: Never  Vaping Use   Vaping Use: Never used  Substance and Sexual Activity   Alcohol use: Not Currently    Alcohol/week: 3.0 standard drinks    Types: 3 Cans of beer per week    Comment: has not drank sice 11/27/20   Drug use: Not Currently    Comment: REMOTE HISTORY, Quit in 2007   Sexual activity: Yes  Other Topics Concern   Not on file  Social History Narrative   Lives with sister   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   Right Handed    Drinks caffeine on occassion   Social Determinants of Health   Financial Resource Strain: Not on file  Food Insecurity: Not on file  Transportation Needs: Not on file  Physical Activity: Not on file  Stress: Not on file  Social Connections: Not on file   Past Surgical History:  Procedure Laterality Date   COLONOSCOPY WITH PROPOFOL N/A 10/29/2015   incomplete due to redundant colon. two simple tubular adenomas removed. patient was advised to go to [facility] for colonoscopy but he did not keep appt.   IR CT HEAD LTD  02/02/2021   IR PERCUTANEOUS ART THROMBECTOMY/INFUSION INTRACRANIAL INC DIAG ANGIO  02/02/2021   RADIOLOGY WITH ANESTHESIA N/A 02/02/2021   Procedure: IR WITH ANESTHESIA;  Surgeon: Luanne Bras, MD;  Location: [facility];  Service: Radiology;  Laterality: N/A;   TEE WITHOUT CARDIOVERSION N/A 09/10/2020   Procedure: TRANSESOPHAGEAL ECHOCARDIOGRAM (TEE) WITH PROPOFOL;  Surgeon: Satira Sark, MD;  Location:  [facility];  Service: Cardiovascular;  Laterality: N/A;   Past Medical History:  Diagnosis Date   Acute CVA (cerebrovascular accident) ([place]) 09/09/2020   Chronic anticoagulation 11/14/2014   CKD (chronic kidney disease) stage 3, GFR 30-59 ml/min ([place]) 10/24/2019   CKD (chronic kidney disease) stage 3, GFR 30-59 ml/min ([place]) 10/24/2019   Clotting disorder ([place])    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   Prediabetes 10/24/2019   Thyroid nodule 01/25/2021   BP 110/67   Pulse 78   Temp 99 F (37.2 C)   Ht 6' (1.829 m)   SpO2 95%  BMI 31.60 kg/m   Opioid Risk Score:   Fall Risk Score:  `1  Depression screen PHQ 2/9  Depression screen [facility] 2/9 05/09/2021 04/10/2021 11/20/2020 01/30/2020 12/05/2019 09/14/2017  Decreased Interest 0 0 0 0 0 0  Down, Depressed, Hopeless 0 0 0 0 0 0  PHQ - 2 Score 0 0 0 0 0 0  Altered sleeping - 0 0 - - -  Tired, decreased energy - 0 0 - - -  Change in appetite - 0 0 - - -  Feeling bad or failure about yourself  - 0 0 - - -  Trouble concentrating - 0 0 - - -  Moving slowly or fidgety/restless - 0 0 - - -  Suicidal thoughts - 0 0 - - -  PHQ-9 Score - 0 0 - - -  Difficult doing work/chores - Not difficult at all Not difficult at all - - -  Some recent data might be hidden    Review of Systems  Musculoskeletal:  Positive for gait problem.  Neurological:  Positive for weakness.  Psychiatric/Behavioral:         Depression  All other systems reviewed and are negative.     Objective:   Physical Exam Vitals and nursing note reviewed.  Constitutional:      Appearance: He is normal weight.  HENT:     Head:  Normocephalic and atraumatic.  Eyes:     Extraocular Movements: Extraocular movements intact.     Conjunctiva/sclera: Conjunctivae normal.     Pupils: Pupils are equal, round, and reactive to light.  Musculoskeletal:        General: No swelling or tenderness.  Skin:    General: Skin is warm and dry.  Neurological:     General: No focal deficit present.     Mental Status: He is alert and oriented to person, place, and time.     Comments: Motor strength is to minus at the elbow flexor elbow extensor as well as finger flexors and extensors trace at the deltoid Left lower extremity strength 3 - hip knee extensor synergy trace ankle plantarflexion 0 ankle dorsiflexion Tone MAS 3 at the elbow flexors MAS 3 at the pectoralis on the left side MAS 2 at the finger and wrist flexors No clonus at the ankle No evidence of excess hamstring tone No evidence of foot inversion There is evidence of toe curling with standing  Psychiatric:        Mood and Affect: Mood normal.        Behavior: Behavior normal.          Assessment & Plan:   1.  History of right MCA infarct with left spastic hemiparesis overall doing well functionally given the severity of his stroke.  Continue outpatient PT OT We discussed that his tone may benefit from botulinum toxin injections pectoralis as well as the elbow flexor musculature.  Patient would like to think about this.  He will return after 6 weeks when he finishes outpatient therapy. Discussed with patient and son answered questions Sign for new AFO

## 2021-08-20 ENCOUNTER — Ambulatory Visit (HOSPITAL_COMMUNITY): Payer: Medicare Other

## 2021-08-20 DIAGNOSIS — R2689 Other abnormalities of gait and mobility: Secondary | ICD-10-CM

## 2021-08-20 DIAGNOSIS — R278 Other lack of coordination: Secondary | ICD-10-CM

## 2021-08-20 DIAGNOSIS — R262 Difficulty in walking, not elsewhere classified: Secondary | ICD-10-CM

## 2021-08-20 DIAGNOSIS — M25642 Stiffness of left hand, not elsewhere classified: Secondary | ICD-10-CM | POA: Diagnosis not present

## 2021-08-20 DIAGNOSIS — R29898 Other symptoms and signs involving the musculoskeletal system: Secondary | ICD-10-CM

## 2021-08-20 DIAGNOSIS — M6281 Muscle weakness (generalized): Secondary | ICD-10-CM

## 2021-08-20 DIAGNOSIS — M25622 Stiffness of left elbow, not elsewhere classified: Secondary | ICD-10-CM

## 2021-08-20 NOTE — Therapy (Signed)
[facility] [address] [place], [place], [address] Phone: [phone]   Fax:  [phone]  Physical Therapy Treatment  Patient Details  Name: Matthew Wise MRN: [id number] Date of Birth: March 05, 1961 Referring Provider (PT): Alysia Penna MD   Encounter Date: 08/20/2021   PT End of Session - 08/20/21 1305     Visit Number 23    Number of Visits 31    Date for PT Re-Evaluation 09/09/21    Authorization Type [employer]    Progress Note Due on Visit 31    PT Start Time 1300    PT Stop Time 1345    PT Time Calculation (min) 45 min    Equipment Utilized During Treatment Gait belt   WC behind   Activity Tolerance Patient tolerated treatment well    Behavior During Therapy [facility] for tasks assessed/performed             Past Medical History:  Diagnosis Date   Acute CVA (cerebrovascular accident) ([place]) 09/09/2020   Chronic anticoagulation 11/14/2014   CKD (chronic kidney disease) stage 3, GFR 30-59 ml/min ([place]) 10/24/2019   CKD (chronic kidney disease) stage 3, GFR 30-59 ml/min ([place]) 10/24/2019   Clotting disorder ([place])    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 two simple tubular adenomas removed. patient was advised to go to [facility] for colonoscopy but he did not keep appt.   IR CT HEAD LTD  02/02/2021   IR PERCUTANEOUS ART THROMBECTOMY/INFUSION INTRACRANIAL INC DIAG ANGIO  02/02/2021   RADIOLOGY WITH  ANESTHESIA N/A 02/02/2021   Procedure: IR WITH ANESTHESIA;  Surgeon: Luanne Bras, MD;  Location: [facility] [place] [facility];  Service: Radiology;  Laterality: N/A;   TEE WITHOUT CARDIOVERSION N/A 09/10/2020   Procedure: TRANSESOPHAGEAL ECHOCARDIOGRAM (TEE) WITH PROPOFOL;  Surgeon: Satira Sark, MD;  Location: [facility];  Service: Cardiovascular;  Laterality: N/A;    There were no vitals filed for this visit.   Subjective Assessment - 08/20/21 1303     Subjective Reports increased walking at home using RW. Reinforced getting AFO assessment lined up                [facility] PT Assessment - 08/20/21 0001       Assessment   Medical Diagnosis S/P right MCA with left hemiparesis                           [facility] Adult PT Treatment/Exercise - 08/20/21 0001       Transfers   Transfers Sit to Stand    Sit to Stand 4: Min guard    Sit to Stand Details Tactile cues for initiation;Tactile cues for sequencing;Tactile cues for weight shifting;Tactile cues for posture;Tactile cues for placement;Tactile cues for weight beaing      Ambulation/Gait   Ambulation/Gait Yes    Ambulation/Gait Assistance 4: Min guard    Ambulation Distance (Feet) 50 Feet  Assistive device Small based quad cane    Gait Pattern Step-to pattern;Decreased stride length;Left foot flat;Decreased dorsiflexion - left;Decreased stance time - left;Decreased step length - right;Poor foot clearance - left    Ambulation Surface Level;Indoor    Pre-Gait Activities Activities to improve limb advancement and increase left foot clearance and stable knee at weight acceptance.  Trials at stairs to improve hip flexion for foot clearance, therapist stabilizing left knee for extension support. Use of NBQC during weight shifting drill to improve independence and safety with ambulation and facilitate even step length. Supine hip flexion on green ball LLE 5x10 reps to improve proximal control limb advancement      Knee/Hip Exercises:  Seated   Other Seated Knee/Hip Exercises seated ankle DF/PF on Bosu. Hip flexion with Bosu x 2 min                       PT Short Term Goals - 08/12/21 1527       PT SHORT TERM GOAL #1   Title Patient will be independent with initial HEP and self-management strategies to improve functional outcomes    Time 4    Period Weeks    Status On-going    Target Date 08/26/21      PT SHORT TERM GOAL #2   Title Patient will be Min A with transfers and bed mobility    Time 4    Period Weeks    Status Achieved    Target Date 06/19/21               PT Long Term Goals - 08/12/21 1528       PT LONG TERM GOAL #1   Title Patient will be Mod I with bed mobility and transfers    Baseline Min A bed mobility. CGA-supervision transfers    Time 8    Period Weeks    Status On-going      PT LONG TERM GOAL #2   Title Patient will be able to ambulate >100 feet with LRAD for improved household mobility    Baseline Current 115 feet with HW    Time 8    Period Weeks    Status Achieved      PT LONG TERM GOAL #3   Title Patient will have at least 4/5 MMT throughout LLE (except ankle DF) for improved functional mobility and stair/ step navigation.    Baseline See MMT    Time 8    Period Weeks    Status On-going    Target Date 09/09/21                   Plan - 08/20/21 1343     Clinical Impression Statement Progressing with gait  training and transfer capabilities. TActile cues needed for LLE sequence and power to clear obstacles.  Demo left foot drop and limited stance phase stability. Would benefit from orthotic intervention LLE to improve foot clearance and knee stability during gait transfers. Continued servcies indicated to imrpove motor control and increase independence with functional mobility    Examination-Activity Limitations Caring for Others;Transfers;Locomotion Level;Toileting;Stand    Examination-Participation Restrictions Yard  Work;Cleaning;Laundry;Community Activity    Stability/Clinical Decision Making Evolving/Moderate complexity    Rehab Potential Fair    PT Frequency 2x / week    PT Duration 4 weeks    PT Treatment/Interventions ADLs/Self Care Home Management;Aquatic Therapy;Biofeedback;Cryotherapy;Fluidtherapy;Contrast Bath;Electrical Stimulation;Therapeutic exercise;Orthotic Fit/Training;Patient/family education;Manual lymph drainage;Manual techniques;Dry needling;Energy conservation;Spinal Manipulations;Joint Manipulations;Splinting;Taping;Compression bandaging;Vasopneumatic Device;Therapeutic activities;Parrafin;Ultrasound;Traction;Functional  mobility training;Neuromuscular re-education;Stair training;Gait training;Moist Heat;Iontophoresis 4mg /ml Dexamethasone;DME Instruction;Balance training;Passive range of motion;Scar mobilization;Wheelchair mobility training;Visual/perceptual remediation/compensation    PT Next Visit Plan Progress functional mobility as tolerated. Encourage WB through LT side. Gait. F/U about AFO fitting    PT Home Exercise Plan Eval: marches, LAQs, heel/toe raise; 7/1:  bridge and sidelying clam 7/12 heel slide, supine hip abduction; tall sitting with lumbar support    Consulted and Agree with Plan of Care Patient             Patient will benefit from skilled therapeutic intervention in order to improve the following deficits and impairments:  Abnormal gait, Improper body mechanics, Impaired sensation, Decreased balance, Difficulty walking, Decreased activity tolerance, Decreased range of motion, Decreased strength, Hypomobility, Impaired tone, Decreased mobility, Decreased coordination  Visit Diagnosis: Muscle weakness (generalized)  Difficulty in walking, not elsewhere classified  Other abnormalities of gait and mobility  Other symptoms and signs involving the musculoskeletal system     Problem List Patient Active Problem List   Diagnosis Date Noted   Urinary frequency     Dysphagia, post-stroke    Spastic hemiplegia affecting nondominant side (HCC)    Drug-induced hypotension    Pressure injury of skin 02/14/2021   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   Dyslipidemia    Hemiparesis affecting left side as late effect of stroke ([place])    Middle cerebral artery embolism, right 02/03/2021   Status post surgery 02/02/2021   Stroke (cerebrum) ([place]) 02/02/2021   Hypoglycemia 01/25/2021   Thyroid nodule 01/25/2021   Hypothyroidism 01/25/2021   Near syncope 01/21/2021   H/O adenomatous polyp of colon 12/30/2020   Acute ischemic stroke ([place]) 11/28/2020   Essential hypertension 11/28/2020   Mixed hyperlipidemia 11/28/2020   Acute CVA (cerebrovascular accident) ([place]) 09/09/2020   Transient ischemic attack 09/07/2020   History of hypertension 09/07/2020   Elevated troponin 09/07/2020   Dizziness 09/07/2020   Numbness and tingling 09/07/2020   H/O medication noncompliance 09/07/2020   Medicare annual wellness visit, subsequent 12/05/2019   Nicotine dependence 12/05/2019   Chronic kidney disease, stage 3 unspecified ([place]) 10/24/2019   Prediabetes 10/24/2019   Edema of foot 09/14/2017   Tubular adenoma of colon 09/14/2017   Long term (current) use of anticoagulants 11/14/2014   Sickle cell trait ([place]) 08/29/2012   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

## 2021-08-24 ENCOUNTER — Other Ambulatory Visit (INDEPENDENT_AMBULATORY_CARE_PROVIDER_SITE_OTHER): Payer: Self-pay | Admitting: Nurse Practitioner

## 2021-08-24 DIAGNOSIS — D6851 Activated protein C resistance: Secondary | ICD-10-CM

## 2021-08-24 DIAGNOSIS — I10 Essential (primary) hypertension: Secondary | ICD-10-CM

## 2021-08-24 DIAGNOSIS — Z8673 Personal history of transient ischemic attack (TIA), and cerebral infarction without residual deficits: Secondary | ICD-10-CM

## 2021-08-24 DIAGNOSIS — Z7901 Long term (current) use of anticoagulants: Secondary | ICD-10-CM

## 2021-08-25 ENCOUNTER — Other Ambulatory Visit: Payer: Self-pay

## 2021-08-25 ENCOUNTER — Encounter (HOSPITAL_COMMUNITY): Payer: Self-pay

## 2021-08-25 ENCOUNTER — Ambulatory Visit (HOSPITAL_COMMUNITY): Payer: Medicare Other

## 2021-08-25 DIAGNOSIS — M6281 Muscle weakness (generalized): Secondary | ICD-10-CM | POA: Diagnosis not present

## 2021-08-25 DIAGNOSIS — R29898 Other symptoms and signs involving the musculoskeletal system: Secondary | ICD-10-CM | POA: Diagnosis not present

## 2021-08-25 DIAGNOSIS — R2689 Other abnormalities of gait and mobility: Secondary | ICD-10-CM | POA: Diagnosis not present

## 2021-08-25 DIAGNOSIS — M25622 Stiffness of left elbow, not elsewhere classified: Secondary | ICD-10-CM | POA: Diagnosis not present

## 2021-08-25 DIAGNOSIS — R262 Difficulty in walking, not elsewhere classified: Secondary | ICD-10-CM | POA: Diagnosis not present

## 2021-08-25 DIAGNOSIS — M25642 Stiffness of left hand, not elsewhere classified: Secondary | ICD-10-CM | POA: Diagnosis not present

## 2021-08-25 DIAGNOSIS — R278 Other lack of coordination: Secondary | ICD-10-CM | POA: Diagnosis not present

## 2021-08-25 NOTE — Therapy (Signed)
[facility] [address] [place], [place], [address] Phone: [phone]   Fax:  [phone]  Physical Therapy Treatment  Patient Details  Name: Matthew Wise MRN: [id number] Date of Birth: 02-06-1961 Referring Provider (PT): Alysia Penna MD   Encounter Date: 08/25/2021   PT End of Session - 08/25/21 1509     Visit Number 24    Number of Visits 31    Date for PT Re-Evaluation 09/09/21    Authorization Type [employer] MEdic[employer]    Progress Note Due on Visit 31    PT Start Time 1301    PT Stop Time 1345    PT Time Calculation (min) 44 min    Equipment Utilized During Treatment Gait belt    Activity Tolerance Patient tolerated treatment well    Behavior During Therapy [facility] for tasks assessed/performed             Past Medical History:  Diagnosis Date   Acute CVA (cerebrovascular accident) ([place]) 09/09/2020   Chronic anticoagulation 11/14/2014   CKD (chronic kidney disease) stage 3, GFR 30-59 ml/min ([place]) 10/24/2019   CKD (chronic kidney disease) stage 3, GFR 30-59 ml/min ([place]) 10/24/2019   Clotting disorder ([place])    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 two simple tubular adenomas removed. patient was advised to go to [facility] for colonoscopy but he did not keep appt.   IR CT HEAD LTD  02/02/2021   IR PERCUTANEOUS ART THROMBECTOMY/INFUSION INTRACRANIAL INC DIAG ANGIO  02/02/2021   RADIOLOGY WITH ANESTHESIA N/A  02/02/2021   Procedure: IR WITH ANESTHESIA;  Surgeon: Luanne Bras, MD;  Location: [facility];  Service: Radiology;  Laterality: N/A;   TEE WITHOUT CARDIOVERSION N/A 09/10/2020   Procedure: TRANSESOPHAGEAL ECHOCARDIOGRAM (TEE) WITH PROPOFOL;  Surgeon: Satira Sark, MD;  Location[facility];  Service: Cardiovascular;  Laterality: N/A;    There were no vitals filed for this visit.   Subjective Assessment - 08/25/21 1311     Subjective Pt arrived with copy of signed AFO prescription.  Reports he has not purchased yet.    Pertinent History CVA 02/02/21    Currently in Pain? No/denies                               [facility] Adult PT Treatment/Exercise - 08/25/21 0001       Transfers   Transfers Sit to Stand    Sit to Stand 4: Min guard    Sit to Stand Details Tactile cues for initiation;Tactile cues for sequencing;Tactile cues for weight shifting;Tactile cues for posture;Tactile cues for placement;Tactile cues for weight beaing    Comments Cueing for Lt foot placement prior standing to improve weight bearing      Ambulation/Gait   Ambulation/Gait Yes    Ambulation/Gait Assistance 4: Min guard    Ambulation Distance (Feet) 204 Feet   3 sets: 62, 102, 55ft  Assistive device Hemi-walker    Gait Pattern Step-through pattern;Decreased stance time - left;Decreased step length - right;Left foot flat;Decreased dorsiflexion - left;Decreased weight shift to left;Poor foot clearance - left    Ambulation Surface Level;Indoor    Pre-Gait Activities Activities to improve Lt LE weight bearing and improve hip flexion strength and balance.  Sitting on BOSU no HHA, with alternating marching and LAQ, toe tapping alternating on 7in step height with 1 HR assistance with therapist supporting Lt knee      Knee/Hip Exercises: Seated   Other Seated Knee/Hip Exercises Sitting on BOSU with minimal hand use LAQ and marching                       PT Short Term Goals - 08/12/21 1527        PT SHORT TERM GOAL #1   Title Patient will be independent with initial HEP and self-management strategies to improve functional outcomes    Time 4    Period Weeks    Status On-going    Target Date 08/26/21      PT SHORT TERM GOAL #2   Title Patient will be Min A with transfers and bed mobility    Time 4    Period Weeks    Status Achieved    Target Date 06/19/21               PT Long Term Goals - 08/12/21 1528       PT LONG TERM GOAL #1   Title Patient will be Mod I with bed mobility and transfers    Baseline Min A bed mobility. CGA-supervision transfers    Time 8    Period Weeks    Status On-going      PT LONG TERM GOAL #2   Title Patient will be able to ambulate >100 feet with LRAD for improved household mobility    Baseline Current 115 feet with HW    Time 8    Period Weeks    Status Achieved      PT LONG TERM GOAL #3   Title Patient will have at least 4/5 MMT throughout LLE (except ankle DF) for improved functional mobility and stair/ step navigation.    Baseline See MMT    Time 8    Period Weeks    Status On-going    Target Date 09/09/21                   Plan - 08/25/21 1509     Clinical Impression Statement Pt brought signed order for AFO and stated next apt with MD in November.  Pt educated that MD office will probably not have AFO there, to contact list already given and set up apt to purchase AFO to improve foot clearance with gait for safety.  Session focus on improving gait mechanics and Lt LE strengthening.  Added sitting on BOSU for balance with Lt LE sitting activities.  Cueing to improve Rt step length to normalize gait mechanics and increased Lt LE weight bearing.  No LOB through session, was limited by fatigue required 2 sitting rest breaks, WC behind during gait for safety.    Examination-Activity Limitations Caring for Others;Transfers;Locomotion Level;Toileting;Stand    Examination-Participation Restrictions Yard  Work;Cleaning;Laundry;Community Activity    Stability/Clinical Decision Making Evolving/Moderate complexity    Clinical Decision Making Moderate    Rehab Potential Fair    PT Frequency 2x / week    PT Duration 4 weeks  PT Treatment/Interventions ADLs/Self Care Home Management;Aquatic Therapy;Biofeedback;Cryotherapy;Fluidtherapy;Contrast Bath;Electrical Stimulation;Therapeutic exercise;Orthotic Fit/Training;Patient/family education;Manual lymph drainage;Manual techniques;Dry needling;Energy conservation;Spinal Manipulations;Joint Manipulations;Splinting;Taping;Compression bandaging;Vasopneumatic Device;Therapeutic activities;Parrafin;Ultrasound;Traction;Functional mobility training;Neuromuscular re-education;Stair training;Gait training;Moist Heat;Iontophoresis 4mg /ml Dexamethasone;DME Instruction;Balance training;Passive range of motion;Scar mobilization;Wheelchair mobility training;Visual/perceptual remediation/compensation    PT Next Visit Plan Progress functional mobility as tolerated. Encourage WB through LT side. Gait. F/U about AFO fitting    PT Home Exercise Plan Eval: marches, LAQs, heel/toe raise; 7/1:  bridge and sidelying clam 7/12 heel slide, supine hip abduction; tall sitting with lumbar support             Patient will benefit from skilled therapeutic intervention in order to improve the following deficits and impairments:  Abnormal gait, Improper body mechanics, Impaired sensation, Decreased balance, Difficulty walking, Decreased activity tolerance, Decreased range of motion, Decreased strength, Hypomobility, Impaired tone, Decreased mobility, Decreased coordination  Visit Diagnosis: Muscle weakness (generalized)  Difficulty in walking, not elsewhere classified  Other abnormalities of gait and mobility  Other symptoms and signs involving the musculoskeletal system     Problem List Patient Active Problem List   Diagnosis Date Noted   Urinary frequency    Dysphagia,  post-stroke    Spastic hemiplegia affecting nondominant side (HCC)    Drug-induced hypotension    Pressure injury of skin 02/14/2021   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   Dyslipidemia    Hemiparesis affecting left side as late effect of stroke ([place])    Middle cerebral artery embolism, right 02/03/2021   Status post surgery 02/02/2021   Stroke (cerebrum) ([place]) 02/02/2021   Hypoglycemia 01/25/2021   Thyroid nodule 01/25/2021   Hypothyroidism 01/25/2021   Near syncope 01/21/2021   H/O adenomatous polyp of colon 12/30/2020   Acute ischemic stroke ([place]) 11/28/2020   Essential hypertension 11/28/2020   Mixed hyperlipidemia 11/28/2020   Acute CVA (cerebrovascular accident) ([place]) 09/09/2020   Transient ischemic attack 09/07/2020   History of hypertension 09/07/2020   Elevated troponin 09/07/2020   Dizziness 09/07/2020   Numbness and tingling 09/07/2020   H/O medication noncompliance 09/07/2020   Medicare annual wellness visit, subsequent 12/05/2019   Nicotine dependence 12/05/2019   Chronic kidney disease, stage 3 unspecified ([place]) 10/24/2019   Prediabetes 10/24/2019   Edema of foot 09/14/2017   Tubular adenoma of colon 09/14/2017   Long term (current) use of anticoagulants 11/14/2014   Sickle cell trait ([place]) 08/29/2012   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

## 2021-08-26 ENCOUNTER — Encounter (HOSPITAL_COMMUNITY): Payer: Self-pay

## 2021-08-26 NOTE — Therapy (Signed)
[facility] [place], [place], [address] Phone: [phone]   Fax:  [phone]  Occupational Therapy Treatment  Patient Details  Name: Matthew Wise MRN: [id number] Date of Birth: 1961-06-15 Referring Provider (OT): Alysia Penna, MD   Encounter Date: 08/20/2021   OT End of Session - 08/26/21 1537     Visit Number 24    Number of Visits 34    Date for OT Re-Evaluation 09/18/21    Authorization Type UHC medicare    Authorization Time Period 11/30/20-end of year. $30 copay. 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 two simple tubular adenomas removed. patient was advised to go to [facility] for colonoscopy but he did not keep appt.   IR CT HEAD LTD  02/02/2021   IR PERCUTANEOUS ART THROMBECTOMY/INFUSION INTRACRANIAL INC DIAG ANGIO  02/02/2021    RADIOLOGY WITH ANESTHESIA N/A 02/02/2021   Procedure: IR WITH ANESTHESIA;  Surgeon: Luanne Bras, MD;  Location: [facility];  Service: Radiology;  Laterality: N/A;   TEE WITHOUT CARDIOVERSION N/A 09/10/2020   Procedure: TRANSESOPHAGEAL ECHOCARDIOGRAM (TEE) WITH PROPOFOL;  Surgeon: Satira Sark, MD;  Location: [facility];  Service: Cardiovascular;  Laterality: N/A;    There were no vitals filed for this visit.   Subjective Assessment - 08/26/21 1338     Subjective  S: nothing new to report.    Currently in Pain? No/denies                [facility] OT Assessment - 08/26/21 1338       Assessment   Medical Diagnosis S/P right MCA with left hemiparesis      Precautions   Precautions Fall    Precaution Comments left side hemiparesis, left side visual field deficits                      OT Treatments/Exercises (OP) - 08/26/21 1338       Exercises   Exercises Shoulder      Neurological Re-education Exercises   Other Exercises 1 Vibration used with massage gun through orange playground ball. left hand placed on ball with fingers extended. OT completed side to side and forward and back movements of wrist while hand was positioned on  ball.    Grasp and Release Thumb Opposition    Other Grasp and Release Exercises  With use of NMES during on ramp, patient completed active assisted digit extension (bilateral), during off ramp he completed active digit flexion (in a fist)    Other Grasp and Release Exercises  Without use of NMES, completed functional low level reaching with table assisting, finger extension with palm down into full finger flexion with palm up before releasing sponges.      Modalities   Modalities Teacher, English as a foreign language Location left wrist flexors and extensors    Electrical Stimulation Action Engineer, petroleum Parameters 32 mA CC    Electrical Stimulation Goals Neuromuscular facilitation                       OT Short Term Goals - 08/26/21 1543       OT SHORT TERM GOAL #1   Title Patient will be educated and independent with HEP completion in order to faciliate his progress in therapy and begin to use his LUE as a gross assist for 25% or more of daily tasks.    Time 6    Period Weeks    Target Date 06/25/21      OT SHORT TERM GOAL #2   Title Patient will demonstrate a 25% increase in A/ROM of the LUE (shoulder, elbow, wrist) as able while using his LUE as a gross assist when completing self care tasks such as dressing.    Time 6    Period Weeks      OT SHORT TERM GOAL #3   Title Patient will demonstrate increased fine motor coordination in his left hand by completing the 9 hole peg test with peg set-up if needed.    Baseline 9/8: Unable to complete even with ste up provided.    Time 6    Period Weeks    Status On-going      OT SHORT TERM GOAL #4   Title Patient will increase is left hand strength to 10# and his pinch strength to 5# in order to begin to use his left hand to stabilize objects when completing self feeding.    Baseline 9/8: grip strength: 7#, lateral pinch 2#, 3 point pinch: 2#    Time 6    Period Weeks    Status On-going      OT SHORT TERM GOAL #5   Title Patient will completing bathing and dressing with Mod assist where needed while utilizing DME, Adaptive equipment, and/or compensatory techniques with improved sitting and standing balance.    Time 6    Period Weeks               OT Long Term Goals - 05/16/21 1415       OT LONG TERM GOAL #1   Title Patient will demonstrate improved LUE A/ROM by 50% or more while utilizing his LUE as his non dominant extremity to provide an active assist to complete tasks that require use of bilateral UE's.    Time 12    Period Weeks    Status On-going      OT LONG TERM GOAL #2   Title Patient will increase his LUE strength (shoulder, elbow, wrist) to 4/5 or better while demonstrating  improved ability to complete bathing and dressing tasks.    Time 12    Period Weeks    Status On-going  OT LONG TERM GOAL #3   Title Patient will increase his fine motor coordination in his left hand by completing the 9 hole peg test in the standardized fashion under 1'.    Time 12    Period Weeks    Status On-going      OT LONG TERM GOAL #4   Title Patient will demonstrate improved left hand grip strength to 15# and pinch strength to 8# in order to manipulate and hold onto items in his left hand without dropping.    Time 12    Period Weeks    Status On-going      OT LONG TERM GOAL #5   Title Patient will completing bathing and dressing with min assist where needed while utilizing DME, Adaptive equipment, and/or compensatory techniques with improved sitting and standing balance.    Status On-going                   Plan - 08/26/21 1538     Clinical Impression Statement A: Patient is able to demonstrate increased functional use during grasp and release tasks when his left forearm is pronated to extend his fingers then supinated to help with digit flexion. VC for form and technique.    Body Structure / Function / Physical Skills ADL;Decreased knowledge of use of DME;Strength;Tone;GMC;Balance;UE functional use;Vision;ROM;IADL;Sensation;Coordination;FMC    Plan P: Continue with use of NMES to assist with grasp and release. supinate forearm when completing full grasp and pronate to help with finger extension.    Consulted and Agree with Plan of Care Patient             Patient will benefit from skilled therapeutic intervention in order to improve the following deficits and impairments:   Body Structure / Function / Physical Skills: ADL, Decreased knowledge of use of DME, Strength, Tone, GMC, Balance, UE functional use, Vision, ROM, IADL, Sensation, Coordination, [facility]       Visit Diagnosis: Other symptoms and signs involving the musculoskeletal system  Stiffness of  left elbow, not elsewhere classified  Stiffness of left hand, not elsewhere classified  Other lack of coordination    Problem List Patient Active Problem List   Diagnosis Date Noted   Urinary frequency    Dysphagia, post-stroke    Spastic hemiplegia affecting nondominant side (HCC)    Drug-induced hypotension    Pressure injury of skin 02/14/2021   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   Dyslipidemia    Hemiparesis affecting left side as late effect of stroke ([place])    Middle cerebral artery embolism, right 02/03/2021   Status post surgery 02/02/2021   Stroke (cerebrum) ([place]) 02/02/2021   Hypoglycemia 01/25/2021   Thyroid nodule 01/25/2021   Hypothyroidism 01/25/2021   Near syncope 01/21/2021   H/O adenomatous polyp of colon 12/30/2020   Acute ischemic stroke ([place]) 11/28/2020   Essential hypertension 11/28/2020   Mixed hyperlipidemia 11/28/2020   Acute CVA (cerebrovascular accident) ([place]) 09/09/2020   Transient ischemic attack 09/07/2020   History of hypertension 09/07/2020   Elevated troponin 09/07/2020   Dizziness 09/07/2020   Numbness and tingling 09/07/2020   H/O medication noncompliance 09/07/2020   Medicare annual wellness visit, subsequent 12/05/2019   Nicotine dependence 12/05/2019   Chronic kidney disease, stage 3 unspecified ([place]) 10/24/2019   Prediabetes 10/24/2019   Edema of foot 09/14/2017   Tubular adenoma of colon 09/14/2017   Long term (current) use of anticoagulants 11/14/2014   Sickle cell trait ([place]) 08/29/2012   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

## 2021-08-27 ENCOUNTER — Ambulatory Visit (HOSPITAL_COMMUNITY): Payer: Medicare Other

## 2021-08-27 ENCOUNTER — Other Ambulatory Visit: Payer: Self-pay

## 2021-08-27 ENCOUNTER — Encounter (HOSPITAL_COMMUNITY): Payer: Self-pay

## 2021-08-27 DIAGNOSIS — R29898 Other symptoms and signs involving the musculoskeletal system: Secondary | ICD-10-CM | POA: Diagnosis not present

## 2021-08-27 DIAGNOSIS — R2689 Other abnormalities of gait and mobility: Secondary | ICD-10-CM

## 2021-08-27 DIAGNOSIS — M25642 Stiffness of left hand, not elsewhere classified: Secondary | ICD-10-CM | POA: Diagnosis not present

## 2021-08-27 DIAGNOSIS — R278 Other lack of coordination: Secondary | ICD-10-CM

## 2021-08-27 DIAGNOSIS — M25622 Stiffness of left elbow, not elsewhere classified: Secondary | ICD-10-CM

## 2021-08-27 DIAGNOSIS — R262 Difficulty in walking, not elsewhere classified: Secondary | ICD-10-CM

## 2021-08-27 DIAGNOSIS — M6281 Muscle weakness (generalized): Secondary | ICD-10-CM

## 2021-08-27 NOTE — Therapy (Signed)
[facility] [address] [place], [place], [address] Phone: [phone]   Fax:  [phone]  Physical Therapy Treatment  Patient Details  Name: Matthew Wise MRN: [id number] Date of Birth: 1961-01-13 Referring Provider (PT): Alysia Penna MD   Encounter Date: 08/27/2021   PT End of Session - 08/27/21 1400     Visit Number 25    Number of Visits 31    Date for PT Re-Evaluation 09/09/21    Authorization Type UHC MEdicare    Progress Note Due on Visit 31    PT Start Time 1345    PT Stop Time 1430    PT Time Calculation (min) 45 min    Equipment Utilized During Treatment Gait belt    Activity Tolerance Patient tolerated treatment well    Behavior During Therapy [facility] for tasks assessed/performed             Past Medical History:  Diagnosis Date   Acute CVA (cerebrovascular accident) ([place]) 09/09/2020   Chronic anticoagulation 11/14/2014   CKD (chronic kidney disease) stage 3, GFR 30-59 ml/min ([place]) 10/24/2019   CKD (chronic kidney disease) stage 3, GFR 30-59 ml/min ([place]) 10/24/2019   Clotting disorder ([place])    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 two simple tubular adenomas removed. patient was advised to go to [facility] for colonoscopy but he did not keep appt.   IR CT HEAD LTD  02/02/2021   IR PERCUTANEOUS ART THROMBECTOMY/INFUSION INTRACRANIAL INC DIAG ANGIO  02/02/2021   RADIOLOGY WITH ANESTHESIA N/A  02/02/2021   Procedure: IR WITH ANESTHESIA;  Surgeon: Luanne Bras, MD;  Location: [facility];  Service: Radiology;  Laterality: N/A;   TEE WITHOUT CARDIOVERSION N/A 09/10/2020   Procedure: TRANSESOPHAGEAL ECHOCARDIOGRAM (TEE) WITH PROPOFOL;  Surgeon: Satira Sark, MD;  Location: [facility];  Service: Cardiovascular;  Laterality: N/A;    There were no vitals filed for this visit.   Subjective Assessment - 08/27/21 1409     Subjective Pt has appointment for AFO consultation next week    Pertinent History CVA 02/02/21                               [facility] Adult PT Treatment/Exercise - 08/27/21 0001       Ambulation/Gait   Ambulation/Gait Yes    Ambulation/Gait Assistance 4: Min guard    Ambulation Distance (Feet) 50 Feet    Assistive device Large base quad cane    Ambulation Surface Level;Indoor    Gait Comments techniuqes to facilitate knee flexion LLE in mid-stance for quad activation. Cues for hip flexion to improve clearance in swing phase LLE.      Neuro Re-ed    Neuro Re-ed Details  use of NU-step for coordinating LLE knee/ankle extension x 3 min. Static standing with RLE elevated on 12" step 3x5 reps 10 sec hold to increase LLE weight acceptance  with therapist providing tactile cues to LLE for extension. Sidestepping with tactile cues to LLE for forward posture of knee/ankle for loading response and improved hip abductor strength                       PT Short Term Goals - 08/12/21 1527       PT SHORT TERM GOAL #1   Title Patient will be independent with initial HEP and self-management strategies to improve functional outcomes    Time 4    Period Weeks    Status On-going    Target Date 08/26/21      PT SHORT TERM GOAL #2   Title Patient will be Min A with transfers and bed mobility    Time 4    Period Weeks    Status Achieved    Target Date 06/19/21               PT Long Term Goals - 08/12/21 1528       PT LONG TERM GOAL #1    Title Patient will be Mod I with bed mobility and transfers    Baseline Min A bed mobility. CGA-supervision transfers    Time 8    Period Weeks    Status On-going      PT LONG TERM GOAL #2   Title Patient will be able to ambulate >100 feet with LRAD for improved household mobility    Baseline Current 115 feet with HW    Time 8    Period Weeks    Status Achieved      PT LONG TERM GOAL #3   Title Patient will have at least 4/5 MMT throughout LLE (except ankle DF) for improved functional mobility and stair/ step navigation.    Baseline See MMT    Time 8    Period Weeks    Status On-going    Target Date 09/09/21                   Plan - 08/27/21 1434     Clinical Impression Statement Demonstrating imrpoved LLE recruitment for knee extension and ankle plantarflexion and ability to perform left hip flexion for elevating limb to stair surfaces and demonstrating improved quad recruitment in stance phase during gait. Continued sessions indicated to progress LLE strength/motor control and progress gait training to increase speed and safety. LLE demo foot flat loading reponse with decreased knee flexion in pre/swing and minimal left hip extension    Examination-Activity Limitations Caring for Others;Transfers;Locomotion Level;Toileting;Stand    Examination-Participation Restrictions Yard Work;Cleaning;Laundry;Community Activity    Stability/Clinical Decision Making Evolving/Moderate complexity    Rehab Potential Fair    PT Frequency 2x / week    PT Duration 4 weeks    PT Treatment/Interventions ADLs/Self Care Home Management;Aquatic Therapy;Biofeedback;Cryotherapy;Fluidtherapy;Contrast Bath;Electrical Stimulation;Therapeutic exercise;Orthotic Fit/Training;Patient/family education;Manual lymph drainage;Manual techniques;Dry needling;Energy conservation;Spinal Manipulations;Joint Manipulations;Splinting;Taping;Compression bandaging;Vasopneumatic Device;Therapeutic  activities;Parrafin;Ultrasound;Traction;Functional mobility training;Neuromuscular re-education;Stair training;Gait training;Moist Heat;Iontophoresis 4mg /ml Dexamethasone;DME Instruction;Balance training;Passive range of motion;Scar mobilization;Wheelchair mobility training;Visual/perceptual remediation/compensation    PT Next Visit Plan Progress functional mobility as tolerated. Encourage WB through LT side. Gait. F/U about AFO fitting    PT Home Exercise Plan Eval: marches, LAQs, heel/toe raise; 7/1:  bridge and sidelying clam 7/12 heel slide, supine hip abduction; tall sitting with lumbar support             Patient will benefit from skilled therapeutic intervention in order to improve the following deficits and impairments:  Abnormal gait, Improper  body mechanics, Impaired sensation, Decreased balance, Difficulty walking, Decreased activity tolerance, Decreased range of motion, Decreased strength, Hypomobility, Impaired tone, Decreased mobility, Decreased coordination  Visit Diagnosis: Muscle weakness (generalized)  Difficulty in walking, not elsewhere classified  Other abnormalities of gait and mobility  Other symptoms and signs involving the musculoskeletal system     Problem List Patient Active Problem List   Diagnosis Date Noted   Urinary frequency    Dysphagia, post-stroke    Spastic hemiplegia affecting nondominant side (HCC)    Drug-induced hypotension    Pressure injury of skin 02/14/2021   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   Dyslipidemia    Hemiparesis affecting left side as late effect of stroke ([place])    Middle cerebral artery embolism, right 02/03/2021   Status post surgery 02/02/2021   Stroke (cerebrum) ([place]) 02/02/2021   Hypoglycemia 01/25/2021   Thyroid nodule 01/25/2021   Hypothyroidism 01/25/2021   Near syncope 01/21/2021   H/O adenomatous polyp of colon 12/30/2020   Acute ischemic stroke ([place]) 11/28/2020   Essential hypertension  11/28/2020   Mixed hyperlipidemia 11/28/2020   Acute CVA (cerebrovascular accident) ([place]) 09/09/2020   Transient ischemic attack 09/07/2020   History of hypertension 09/07/2020   Elevated troponin 09/07/2020   Dizziness 09/07/2020   Numbness and tingling 09/07/2020   H/O medication noncompliance 09/07/2020   Medicare annual wellness visit, subsequent 12/05/2019   Nicotine dependence 12/05/2019   Chronic kidney disease, stage 3 unspecified ([place]) 10/24/2019   Prediabetes 10/24/2019   Edema of foot 09/14/2017   Tubular adenoma of colon 09/14/2017   Long term (current) use of anticoagulants 11/14/2014   Sickle cell trait ([place]) 08/29/2012   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 [place], [place], [address] Phone: [phone]   Fax:  [phone]  Name: Matthew Wise MRN: [id number] Date of Birth: Aug 26, 1961

## 2021-08-28 ENCOUNTER — Encounter (HOSPITAL_COMMUNITY): Payer: Self-pay

## 2021-08-28 NOTE — Therapy (Signed)
[facility] [place], [place], [address] Phone: [phone]   Fax:  [phone]  Occupational Therapy Treatment  Patient Details  Name: Matthew Wise MRN: [id number] Date of Birth: June 17, 1961 Referring Provider (OT): Alysia Penna, MD   Encounter Date: 08/27/2021   OT End of Session - 08/28/21 1252     Visit Number 25    Number of Visits 34    Date for OT Re-Evaluation 09/18/21    Authorization Type UHC medicare    Authorization Time Period 11/30/20-end of year. $30 copay. 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 two simple tubular adenomas removed. patient was advised to go to [facility] for colonoscopy but he did not keep appt.   IR CT HEAD LTD  02/02/2021   IR PERCUTANEOUS ART THROMBECTOMY/INFUSION INTRACRANIAL INC DIAG ANGIO  02/02/2021    RADIOLOGY WITH ANESTHESIA N/A 02/02/2021   Procedure: IR WITH ANESTHESIA;  Surgeon: Luanne Bras, MD;  Location: [facility];  Service: Radiology;  Laterality: N/A;   TEE WITHOUT CARDIOVERSION N/A 09/10/2020   Procedure: TRANSESOPHAGEAL ECHOCARDIOGRAM (TEE) WITH PROPOFOL;  Surgeon: Satira Sark, MD;  Location: [facility];  Service: Cardiovascular;  Laterality: N/A;    There were no vitals filed for this visit.   Subjective Assessment - 08/28/21 1244     Subjective  S: nothing new to report.    Currently in Pain? No/denies                [facility] OT Assessment - 08/28/21 1245       Assessment   Medical Diagnosis S/P right MCA with left hemiparesis      Precautions   Precautions Fall    Precaution Comments left side hemiparesis, left side visual field deficits                      OT Treatments/Exercises (OP) - 08/28/21 1245       Transfers   Transfers Stand to Sit;Sit to Stand    Sit to Stand 5: Supervision    Stand to Sit 5: Supervision    Comments Initially attempted to stand and complete a 360 degree turn towards his stronger side versus a small pivot turn towards his weaker side.  Transfer was stopped and restarted with education provided before second attempt.      Exercises   Exercises Shoulder      Neurological Re-education Exercises   Other Exercises 1 Vibration used with massage gun through orange playground ball. left hand placed on ball with fingers extended. OT completed side to side and forward and back movements of wrist while hand was positioned on ball.    Grasp and Release Thumb Opposition    Other Grasp and Release Exercises  With use of NMES during on ramp, patient completed active assisted digit extension (bilateral), during off ramp he completed active digit flexion (in a fist)    Other Grasp and Release Exercises  Without use of NMES, completed functional low level reaching with table assisting, finger extension with palm down into full  finger flexion with palm up before releasing sponges.      Modalities   Modalities Teacher, English as a foreign language Location left wrist flexors and extensors    Electrical Stimulation Action Engineer, petroleum Parameters 38 mA CC    Electrical Stimulation Goals Neuromuscular facilitation                      OT Short Term Goals - 08/26/21 1543       OT SHORT TERM GOAL #1   Title Patient will be educated and independent with HEP completion in order to faciliate his progress in therapy and begin to use his LUE as a gross assist for 25% or more of daily tasks.    Time 6    Period Weeks    Target Date 06/25/21      OT SHORT TERM GOAL #2   Title Patient will demonstrate a 25% increase in A/ROM of the LUE (shoulder, elbow, wrist) as able while using his LUE as a gross assist when completing self care tasks such as dressing.    Time 6    Period Weeks      OT SHORT TERM GOAL #3   Title Patient will demonstrate increased fine motor coordination in his left hand by completing the 9 hole peg test with peg set-up if needed.    Baseline 9/8: Unable to complete even with ste up provided.    Time 6    Period Weeks    Status On-going      OT SHORT TERM GOAL #4   Title Patient will increase is left hand strength to 10# and his pinch strength to 5# in order to begin to use his left hand to stabilize objects when completing self feeding.    Baseline 9/8: grip strength: 7#, lateral pinch 2#, 3 point pinch: 2#    Time 6    Period Weeks    Status On-going      OT SHORT TERM GOAL #5   Title Patient will completing bathing and dressing with Mod assist where needed while utilizing DME, Adaptive equipment, and/or compensatory techniques with improved sitting and standing balance.    Time 6    Period Weeks               OT Long Term Goals - 05/16/21 1415       OT LONG TERM GOAL #1   Title Patient will demonstrate  improved LUE A/ROM by 50% or more while utilizing his LUE as his non dominant extremity to provide an active assist to complete tasks that require use of  bilateral UE's.    Time 12    Period Weeks    Status On-going      OT LONG TERM GOAL #2   Title Patient will increase his LUE strength (shoulder, elbow, wrist) to 4/5 or better while demonstrating improved ability to complete bathing and dressing tasks.    Time 12    Period Weeks    Status On-going      OT LONG TERM GOAL #3   Title Patient will increase his fine motor coordination in his left hand by completing the 9 hole peg test in the standardized fashion under 1'.    Time 12    Period Weeks    Status On-going      OT LONG TERM GOAL #4   Title Patient will demonstrate improved left hand grip strength to 15# and pinch strength to 8# in order to manipulate and hold onto items in his left hand without dropping.    Time 12    Period Weeks    Status On-going      OT LONG TERM GOAL #5   Title Patient will completing bathing and dressing with min assist where needed while utilizing DME, Adaptive equipment, and/or compensatory techniques with improved sitting and standing balance.    Status On-going                   Plan - 08/28/21 1253     Clinical Impression Statement A: Continued to focus on NM re-ed with use of NMES unit to provide Active assisted movement of wrist extensors. Pt required intermittent verbal cueing to complete activity without contact hands on and visual cues provided. At end of session, due to time constraint, patient only transferred 3 sponges from right to let left of tray table. Unable to demonstrate grasp when sponge was placed on table and OT provided set-up holding sponge off table approximately 6-8 inches.    Body Structure / Function / Physical Skills ADL;Decreased knowledge of use of DME;Strength;Tone;GMC;Balance;UE functional use;Vision;ROM;IADL;Sensation;Coordination;FMC    Plan P: Continue with  use of NMES to assist with grasp and release. supinate forearm when completing full grasp and pronate to help with finger extension. Attampt grasp and release task standing along with low level reaching.    Consulted and Agree with Plan of Care Patient             Patient will benefit from skilled therapeutic intervention in order to improve the following deficits and impairments:   Body Structure / Function / Physical Skills: ADL, Decreased knowledge of use of DME, Strength, Tone, GMC, Balance, UE functional use, Vision, ROM, IADL, Sensation, Coordination, [facility]       Visit Diagnosis: Other symptoms and signs involving the musculoskeletal system  Stiffness of left elbow, not elsewhere classified  Other lack of coordination  Stiffness of left hand, not elsewhere classified    Problem List Patient Active Problem List   Diagnosis Date Noted   Urinary frequency    Dysphagia, post-stroke    Spastic hemiplegia affecting nondominant side (HCC)    Drug-induced hypotension    Pressure injury of skin 02/14/2021   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   Dyslipidemia    Hemiparesis affecting left side as late effect of stroke ([place])    Middle cerebral artery embolism, right 02/03/2021   Status post surgery 02/02/2021   Stroke (cerebrum) ([place]) 02/02/2021   Hypoglycemia 01/25/2021   Thyroid nodule 01/25/2021   Hypothyroidism 01/25/2021   Near syncope 01/21/2021  H/O adenomatous polyp of colon 12/30/2020   Acute ischemic stroke ([place]) 11/28/2020   Essential hypertension 11/28/2020   Mixed hyperlipidemia 11/28/2020   Acute CVA (cerebrovascular accident) ([place]) 09/09/2020   Transient ischemic attack 09/07/2020   History of hypertension 09/07/2020   Elevated troponin 09/07/2020   Dizziness 09/07/2020   Numbness and tingling 09/07/2020   H/O medication noncompliance 09/07/2020   Medicare annual wellness visit, subsequent 12/05/2019   Nicotine dependence 12/05/2019    Chronic kidney disease, stage 3 unspecified ([place]) 10/24/2019   Prediabetes 10/24/2019   Edema of foot 09/14/2017   Tubular adenoma of colon 09/14/2017   Long term (current) use of anticoagulants 11/14/2014   Sickle cell trait ([place]) 08/29/2012   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

## 2021-08-31 ENCOUNTER — Other Ambulatory Visit (INDEPENDENT_AMBULATORY_CARE_PROVIDER_SITE_OTHER): Payer: Self-pay | Admitting: Nurse Practitioner

## 2021-08-31 DIAGNOSIS — E038 Other specified hypothyroidism: Secondary | ICD-10-CM

## 2021-08-31 DIAGNOSIS — Z8673 Personal history of transient ischemic attack (TIA), and cerebral infarction without residual deficits: Secondary | ICD-10-CM

## 2021-08-31 DIAGNOSIS — Z7901 Long term (current) use of anticoagulants: Secondary | ICD-10-CM

## 2021-08-31 DIAGNOSIS — D6851 Activated protein C resistance: Secondary | ICD-10-CM

## 2021-08-31 DIAGNOSIS — I10 Essential (primary) hypertension: Secondary | ICD-10-CM

## 2021-09-02 ENCOUNTER — Encounter (HOSPITAL_COMMUNITY): Payer: Self-pay | Admitting: Physical Therapy

## 2021-09-02 ENCOUNTER — Ambulatory Visit (HOSPITAL_COMMUNITY): Payer: Medicare Other

## 2021-09-02 ENCOUNTER — Other Ambulatory Visit: Payer: Self-pay

## 2021-09-02 ENCOUNTER — Ambulatory Visit (HOSPITAL_COMMUNITY): Payer: Medicare Other | Attending: Physical Medicine & Rehabilitation | Admitting: Physical Therapy

## 2021-09-02 ENCOUNTER — Encounter (HOSPITAL_COMMUNITY): Payer: Self-pay

## 2021-09-02 DIAGNOSIS — R278 Other lack of coordination: Secondary | ICD-10-CM | POA: Diagnosis present

## 2021-09-02 DIAGNOSIS — M25642 Stiffness of left hand, not elsewhere classified: Secondary | ICD-10-CM | POA: Diagnosis present

## 2021-09-02 DIAGNOSIS — M25622 Stiffness of left elbow, not elsewhere classified: Secondary | ICD-10-CM | POA: Diagnosis present

## 2021-09-02 DIAGNOSIS — M6281 Muscle weakness (generalized): Secondary | ICD-10-CM | POA: Insufficient documentation

## 2021-09-02 DIAGNOSIS — R29898 Other symptoms and signs involving the musculoskeletal system: Secondary | ICD-10-CM | POA: Insufficient documentation

## 2021-09-02 DIAGNOSIS — R262 Difficulty in walking, not elsewhere classified: Secondary | ICD-10-CM | POA: Diagnosis present

## 2021-09-02 DIAGNOSIS — R2689 Other abnormalities of gait and mobility: Secondary | ICD-10-CM | POA: Diagnosis present

## 2021-09-02 NOTE — Patient Instructions (Signed)
1) SHOULDER: Flexion On Table   Place hands on towel placed on table Lean forward with you upper body, pushing towel away from body to straighten out both elbows. May place right hand on top of left hand when completing.  _10-12__ reps per set, complete 2-3 times a day.

## 2021-09-02 NOTE — Therapy (Signed)
[facility] [address] [place], [place], [address] Phone: [phone]   Fax:  [phone]  Physical Therapy Treatment  Patient Details  Name: Matthew Wise MRN: [id number] Date of Birth: May 01, 1961 Referring Provider (PT): Alysia Penna MD   Encounter Date: 09/02/2021   PT End of Session - 09/02/21 1614     Visit Number 26    Number of Visits 31    Date for PT Re-Evaluation 09/09/21    Authorization Type [employer] MEdicare    Progress Note Due on Visit 31    PT Start Time 1523    PT Stop Time 1603    PT Time Calculation (min) 40 min    Equipment Utilized During Treatment Gait belt    Activity Tolerance Patient tolerated treatment well    Behavior During Therapy [facility] for tasks assessed/performed             Past Medical History:  Diagnosis Date   Acute CVA (cerebrovascular accident) ([place]) 09/09/2020   Chronic anticoagulation 11/14/2014   CKD (chronic kidney disease) stage 3, GFR 30-59 ml/min ([place]) 10/24/2019   CKD (chronic kidney disease) stage 3, GFR 30-59 ml/min ([place]) 10/24/2019   Clotting disorder ([place])    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 two simple tubular adenomas removed. patient was advised to go to [facility] for colonoscopy but he did not keep appt.   IR CT HEAD LTD  02/02/2021   IR PERCUTANEOUS ART THROMBECTOMY/INFUSION INTRACRANIAL INC DIAG ANGIO  02/02/2021   RADIOLOGY WITH ANESTHESIA N/A  02/02/2021   Procedure: IR WITH ANESTHESIA;  Surgeon: Luanne Bras, MD;  Location: [facility];  Service: Radiology;  Laterality: N/A;   TEE WITHOUT CARDIOVERSION N/A 09/10/2020   Procedure: TRANSESOPHAGEAL ECHOCARDIOGRAM (TEE) WITH PROPOFOL;  Surgeon: Satira Sark, MD;  Location: [facility];  Service: Cardiovascular;  Laterality: N/A;    There were no vitals filed for this visit.   Subjective Assessment - 09/02/21 1529     Subjective Patient reports a scheduled appointment for a fitting for an AFO. Denies any pain. Reports utilizing a walker to walk.    Pertinent History CVA 02/02/21    Currently in Pain? No/denies                               [facility] Adult PT Treatment/Exercise - 09/02/21 1530       Knee/Hip Exercises: Standing   Hip Flexion Stengthening;Both;2 sets;10 reps   knees to PVC pipe on // bars   Gait Training 90 feet using HW and WC follow    Other Standing Knee Exercises 6" step taps to dycem pad; 1 set x 10 LLE      Knee/Hip Exercises: Seated   Sit to Sand 10 reps                       PT Short  Term Goals - 08/12/21 1527       PT SHORT TERM GOAL #1   Title Patient will be independent with initial HEP and self-management strategies to improve functional outcomes    Time 4    Period Weeks    Status On-going    Target Date 08/26/21      PT SHORT TERM GOAL #2   Title Patient will be Min A with transfers and bed mobility    Time 4    Period Weeks    Status Achieved    Target Date 06/19/21               PT Long Term Goals - 08/12/21 1528       PT LONG TERM GOAL #1   Title Patient will be Mod I with bed mobility and transfers    Baseline Min A bed mobility. CGA-supervision transfers    Time 8    Period Weeks    Status On-going      PT LONG TERM GOAL #2   Title Patient will be able to ambulate >100 feet with LRAD for improved household mobility    Baseline Current 115 feet with HW    Time 8    Period Weeks     Status Achieved      PT LONG TERM GOAL #3   Title Patient will have at least 4/5 MMT throughout LLE (except ankle DF) for improved functional mobility and stair/ step navigation.    Baseline See MMT    Time 8    Period Weeks    Status On-going    Target Date 09/09/21                   Plan - 09/02/21 1531     Clinical Impression Statement Patient tolerated session well. Requires frequent rest breaks as fatigued quickly during session. Introduced patient to 6" step taps with addition of aiming to tap red dycem pad for increased challenge of coordination and motor control. Required cuing to maintain a neutral foot rather than pointing toes outward. Patient continues to require skilled therapy for LE strengthening and motor control to improve gait abnormalities and reduce risk of falls.    Examination-Activity Limitations Caring for Others;Transfers;Locomotion Level;Toileting;Stand    Examination-Participation Restrictions Yard Work;Cleaning;Laundry;Community Activity    Stability/Clinical Decision Making Evolving/Moderate complexity    Rehab Potential Fair    PT Frequency 2x / week    PT Duration 4 weeks    PT Treatment/Interventions ADLs/Self Care Home Management;Aquatic Therapy;Biofeedback;Cryotherapy;Fluidtherapy;Contrast Bath;Electrical Stimulation;Therapeutic exercise;Orthotic Fit/Training;Patient/family education;Manual lymph drainage;Manual techniques;Dry needling;Energy conservation;Spinal Manipulations;Joint Manipulations;Splinting;Taping;Compression bandaging;Vasopneumatic Device;Therapeutic activities;Parrafin;Ultrasound;Traction;Functional mobility training;Neuromuscular re-education;Stair training;Gait training;Moist Heat;Iontophoresis 4mg /ml Dexamethasone;DME Instruction;Balance training;Passive range of motion;Scar mobilization;Wheelchair mobility training;Visual/perceptual remediation/compensation    PT Next Visit Plan Progress functional mobility as tolerated. Encourage  WB through LT side. Gait. F/U about AFO fitting    PT Home Exercise Plan Eval: marches, LAQs, heel/toe raise; 7/1:  bridge and sidelying clam 7/12 heel slide, supine hip abduction; tall sitting with lumbar support             Patient will benefit from skilled therapeutic intervention in order to improve the following deficits and impairments:  Abnormal gait, Improper body mechanics, Impaired sensation, Decreased balance, Difficulty walking, Decreased activity tolerance, Decreased range of motion, Decreased strength, Hypomobility, Impaired tone, Decreased mobility, Decreased coordination  Visit Diagnosis: Muscle weakness (generalized)  Difficulty in walking, not elsewhere classified  Other abnormalities of gait and mobility  Other symptoms and signs involving  the musculoskeletal system     Problem List Patient Active Problem List   Diagnosis Date Noted   Urinary frequency    Dysphagia, post-stroke    Spastic hemiplegia affecting nondominant side (HCC)    Drug-induced hypotension    Pressure injury of skin 02/14/2021   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

## 2021-09-03 NOTE — Therapy (Signed)
[place], [place], [address] Phone: [phone]   Fax:  [phone]  Occupational Therapy Treatment  Patient Details  Name: Matthew Wise MRN: [id number] Date of Birth: [birth year] Referring Provider (OT): Alysia Penna, MD   Encounter Date: 09/02/2021   OT End of Session - 09/03/21 1320     Visit Number 26    Number of Visits 34    Date for OT Re-Evaluation 09/18/21    Authorization Type UHC medicare    Authorization Time Period 11/30/20-end of year. $30 copay. No visit limit    Progress Note Due on Visit 34    OT Start Time 1607   Pt missed earlier OT session. Able to see pt after PT due to cancellation.   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 two simple tubular adenomas removed. patient was advised to go to [facility] for colonoscopy but he did not keep appt.   IR CT HEAD LTD  02/02/2021   IR  PERCUTANEOUS ART THROMBECTOMY/INFUSION INTRACRANIAL INC DIAG ANGIO  02/02/2021   RADIOLOGY WITH ANESTHESIA N/A 02/02/2021   Procedure: IR WITH ANESTHESIA;  Surgeon: Luanne Bras, MD;  Location: [facility];  Service: Radiology;  Laterality: N/A;   TEE WITHOUT CARDIOVERSION N/A 09/10/2020   Procedure: TRANSESOPHAGEAL ECHOCARDIOGRAM (TEE) WITH PROPOFOL;  Surgeon: Satira Sark, MD;  Location[facility];  Service: Cardiovascular;  Laterality: N/A;    There were no vitals filed for this visit.   Subjective Assessment - 09/02/21 1628     Subjective  S: nothing new to report.    Currently in Pain? No/denies                [facility] OT Assessment - 09/02/21 1628       Assessment   Medical Diagnosis S/P right MCA with left hemiparesis      Precautions   Precautions Fall    Precaution Comments left side hemiparesis, left side visual field deficits                      OT Treatments/Exercises (OP) - 09/02/21 1628       Exercises   Exercises Shoulder      Neurological Re-education Exercises   Other Grasp and Release Exercises  Attempted to complete seated reaching task of actively reaching for rings on lowest level of ring  tree and remove. Unable to complete task due to increase flexor tone in the elbow and shoulder.    Development of Reach Hand over hand assistance;Reaching    Towel Slides Shoulder flexion table slide completed to focus on elbow extension. Pt completed with right hand on top of left hand seated at table.    Reaching to Waist Standing at table while using NMES unit to complete functional grasp and release of typical kitchen items (boxes and cylinder shapes). Grasp completed while performing supination with off cycle and release completed along with on cycle of NMES with forearm pronated.      Modalities   Modalities Teacher, English as a foreign language Location left wrist flexors and extensors    Electrical  Stimulation Action Engineer, petroleum Parameters 41 mA CC    Electrical Stimulation Goals Neuromuscular facilitation                    OT Education - 09/02/21 1639     Education Details shoulder flexion table slide to focus on elbow extension    Person(s) Educated Patient    Methods Explanation;Demonstration;Verbal cues;Handout    Comprehension Returned demonstration;Verbalized understanding              OT Short Term Goals - 08/26/21 1543       OT SHORT TERM GOAL #1   Title Patient will be educated and independent with HEP completion in order to faciliate his progress in therapy and begin to use his LUE as a gross assist for 25% or more of daily tasks.    Time 6    Period Weeks    Target Date 06/25/21      OT SHORT TERM GOAL #2   Title Patient will demonstrate a 25% increase in A/ROM of the LUE (shoulder, elbow, wrist) as able while using his LUE as a gross assist when completing self care tasks such as dressing.    Time 6    Period Weeks      OT SHORT TERM GOAL #3   Title Patient will demonstrate increased fine motor coordination in his left hand by completing the 9 hole peg test with peg set-up if needed.    Baseline 9/8: Unable to complete even with ste up provided.    Time 6    Period Weeks    Status On-going      OT SHORT TERM GOAL #4   Title Patient will increase is left hand strength to 10# and his pinch strength to 5# in order to begin to use his left hand to stabilize objects when completing self feeding.    Baseline 9/8: grip strength: 7#, lateral pinch 2#, 3 point pinch: 2#    Time 6    Period Weeks    Status On-going      OT SHORT TERM GOAL #5   Title Patient will completing bathing and dressing with Mod assist where needed while utilizing DME, Adaptive equipment, and/or compensatory techniques with improved sitting and standing balance.    Time 6    Period Weeks               OT Long Term Goals - 05/16/21 1415        OT LONG TERM GOAL #1   Title Patient will demonstrate improved LUE A/ROM by 50% or more while utilizing his LUE as his non dominant extremity to provide an active assist to complete  tasks that require use of bilateral UE's.    Time 12    Period Weeks    Status On-going      OT LONG TERM GOAL #2   Title Patient will increase his LUE strength (shoulder, elbow, wrist) to 4/5 or better while demonstrating improved ability to complete bathing and dressing tasks.    Time 12    Period Weeks    Status On-going      OT LONG TERM GOAL #3   Title Patient will increase his fine motor coordination in his left hand by completing the 9 hole peg test in the standardized fashion under 1'.    Time 12    Period Weeks    Status On-going      OT LONG TERM GOAL #4   Title Patient will demonstrate improved left hand grip strength to 15# and pinch strength to 8# in order to manipulate and hold onto items in his left hand without dropping.    Time 12    Period Weeks    Status On-going      OT LONG TERM GOAL #5   Title Patient will completing bathing and dressing with min assist where needed while utilizing DME, Adaptive equipment, and/or compensatory techniques with improved sitting and standing balance.    Status On-going                   Plan - 09/03/21 1321     Clinical Impression Statement A: Increased flexor tone noted in left elbow and shoulder this session which caused increased difficulty with attempting functional reaching task. Briefly completed passive stretching to elbow and shoulder. Provided table slide (shoulder flexion) to HEP to focus on elbow extension stretching.    Body Structure / Function / Physical Skills ADL;Decreased knowledge of use of DME;Strength;Tone;GMC;Balance;UE functional use;Vision;ROM;IADL;Sensation;Coordination;FMC    Plan P: Follow up on table slide. Continue with NMES unit to focus on grasp and release.    Consulted and Agree with Plan of Care Patient              Patient will benefit from skilled therapeutic intervention in order to improve the following deficits and impairments:   Body Structure / Function / Physical Skills: ADL, Decreased knowledge of use of DME, Strength, Tone, GMC, Balance, UE functional use, Vision, ROM, IADL, Sensation, Coordination, [facility]       Visit Diagnosis: Other symptoms and signs involving the musculoskeletal system  Other lack of coordination  Stiffness of left hand, not elsewhere classified  Stiffness of left elbow, not elsewhere classified    Problem List Patient Active Problem List   Diagnosis Date Noted   Urinary frequency    Dysphagia, post-stroke    Spastic hemiplegia affecting nondominant side (HCC)    Drug-induced hypotension    Pressure injury of skin 02/14/2021   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   Dyslipidemia    Hemiparesis affecting left side as late effect of stroke ([place])    Middle cerebral artery embolism, right 02/03/2021   Status post surgery 02/02/2021   Stroke (cerebrum) ([place]) 02/02/2021   Hypoglycemia 01/25/2021   Thyroid nodule 01/25/2021   Hypothyroidism 01/25/2021   Near syncope 01/21/2021   H/O adenomatous polyp of colon 12/30/2020   Acute ischemic stroke ([place]) 11/28/2020   Essential hypertension 11/28/2020   Mixed hyperlipidemia 11/28/2020   Acute CVA (cerebrovascular accident) ([place]) 09/09/2020   Transient ischemic attack 09/07/2020   History of hypertension 09/07/2020   Elevated troponin 09/07/2020  Dizziness 09/07/2020   Numbness and tingling 09/07/2020   H/O medication noncompliance 09/07/2020   Medicare annual wellness visit, subsequent 12/05/2019   Nicotine dependence 12/05/2019   Chronic kidney disease, stage 3 unspecified ([place]) 10/24/2019   Prediabetes 10/24/2019   Edema of foot 09/14/2017   Tubular adenoma of colon 09/14/2017   Long term (current) use of anticoagulants 11/14/2014   Sickle cell trait ([place]) 08/29/2012   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 [place], [place], [address] Phone: [phone]   Fax:  [phone]  Name: Matthew Wise MRN: [id number] Date of Birth: [birth year]

## 2021-09-04 ENCOUNTER — Encounter (HOSPITAL_COMMUNITY): Payer: Self-pay

## 2021-09-04 ENCOUNTER — Ambulatory Visit (HOSPITAL_COMMUNITY): Payer: Medicare Other | Admitting: Physical Therapy

## 2021-09-04 ENCOUNTER — Ambulatory Visit (HOSPITAL_COMMUNITY): Payer: Medicare Other

## 2021-09-04 ENCOUNTER — Other Ambulatory Visit: Payer: Self-pay

## 2021-09-04 ENCOUNTER — Encounter (HOSPITAL_COMMUNITY): Payer: Self-pay | Admitting: Physical Therapy

## 2021-09-04 DIAGNOSIS — R262 Difficulty in walking, not elsewhere classified: Secondary | ICD-10-CM

## 2021-09-04 DIAGNOSIS — M6281 Muscle weakness (generalized): Secondary | ICD-10-CM

## 2021-09-04 DIAGNOSIS — R29898 Other symptoms and signs involving the musculoskeletal system: Secondary | ICD-10-CM

## 2021-09-04 DIAGNOSIS — R278 Other lack of coordination: Secondary | ICD-10-CM

## 2021-09-04 DIAGNOSIS — R2689 Other abnormalities of gait and mobility: Secondary | ICD-10-CM

## 2021-09-04 DIAGNOSIS — M25622 Stiffness of left elbow, not elsewhere classified: Secondary | ICD-10-CM

## 2021-09-04 DIAGNOSIS — M25642 Stiffness of left hand, not elsewhere classified: Secondary | ICD-10-CM

## 2021-09-04 NOTE — Patient Instructions (Signed)
PROPER CERVICAL AND SPINAL POSTURE - SEATED  Good posture positions your head over your shoulders so that your head is not protruding forward. Your ears should be over your shoulders.   Begin by correcting your low back so that it is not slouched. This will correct much of the spine. You may also need to perform a small chin tuck as well.    The image on the right shows how you should position your head and spine throughout the day. This might be difficult at first but over time will get easier as your body adjusts.        PROPER CERVICAL AND SPINAL POSTURE  Good posture positions your head over your shoulders so that your head is not protruding forward. Your ears should be over your shoulders.   Begin by correcting your low back so that it is not slouched. This will correct much of the spine. You may also need to perform a small chin tuck as well.    The image on the right shows how you should position your head and spine throughout the day. This might be difficult at first but over time will get easier as your body adjusts.

## 2021-09-04 NOTE — Therapy (Signed)
[facility] [address] [place], [place], [address] Phone: [phone]   Fax:  [phone]  Physical Therapy Treatment  Patient Details  Name: Matthew Wise MRN: [id number] Date of Birth: 1961-06-27 Referring Provider (PT): Alysia Penna MD   Encounter Date: 09/04/2021   PT End of Session - 09/04/21 1519     Visit Number 27    Number of Visits 31    Date for PT Re-Evaluation 09/09/21    Authorization Type [employer] MEdicare    Progress Note Due on Visit 31    PT Start Time 1518    PT Stop Time 1600    PT Time Calculation (min) 42 min    Equipment Utilized During Treatment Gait belt    Activity Tolerance Patient tolerated treatment well    Behavior During Therapy [facility] for tasks assessed/performed             Past Medical History:  Diagnosis Date   Acute CVA (cerebrovascular accident) ([place]) 09/09/2020   Chronic anticoagulation 11/14/2014   CKD (chronic kidney disease) stage 3, GFR 30-59 ml/min ([place]) 10/24/2019   CKD (chronic kidney disease) stage 3, GFR 30-59 ml/min ([place]) 10/24/2019   Clotting disorder ([place])    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 two simple tubular adenomas removed. patient was advised to go to [facility] for colonoscopy but he did not keep appt.   IR CT HEAD LTD  02/02/2021   IR PERCUTANEOUS ART THROMBECTOMY/INFUSION INTRACRANIAL INC DIAG ANGIO  02/02/2021   RADIOLOGY WITH ANESTHESIA N/A  02/02/2021   Procedure: IR WITH ANESTHESIA;  Surgeon: Luanne Bras, MD;  Location: [facility];  Service: Radiology;  Laterality: N/A;   TEE WITHOUT CARDIOVERSION N/A 09/10/2020   Procedure: TRANSESOPHAGEAL ECHOCARDIOGRAM (TEE) WITH PROPOFOL;  Surgeon: Satira Sark, MD;  Location: [facility];  Service: Cardiovascular;  Laterality: N/A;    There were no vitals filed for this visit.   Subjective Assessment - 09/04/21 1518     Subjective No new issues. Got fitted for AFO yesterday.    Pertinent History CVA 02/02/21                               [facility] Adult PT Treatment/Exercise - 09/04/21 1541       Knee/Hip Exercises: Standing   Gait Training 190 feet with wide base quad cane    Other Standing Knee Exercises side stepping in // bars, stepping over 3 dumbbells x 3 RT;  standing lateral obstacle step over; LLE; 2x10   AAROM with gait belt at knee to assist with motion for standing lateral obs step over   Other Standing Knee Exercises retro walk in // bars x 4 RT                       PT Short Term Goals - 08/12/21 1527  PT SHORT TERM GOAL #1   Title Patient will be independent with initial HEP and self-management strategies to improve functional outcomes    Time 4    Period Weeks    Status On-going    Target Date 08/26/21      PT SHORT TERM GOAL #2   Title Patient will be Min A with transfers and bed mobility    Time 4    Period Weeks    Status Achieved    Target Date 06/19/21               PT Long Term Goals - 08/12/21 1528       PT LONG TERM GOAL #1   Title Patient will be Mod I with bed mobility and transfers    Baseline Min A bed mobility. CGA-supervision transfers    Time 8    Period Weeks    Status On-going      PT LONG TERM GOAL #2   Title Patient will be able to ambulate >100 feet with LRAD for improved household mobility    Baseline Current 115 feet with HW    Time 8    Period Weeks    Status Achieved      PT  LONG TERM GOAL #3   Title Patient will have at least 4/5 MMT throughout LLE (except ankle DF) for improved functional mobility and stair/ step navigation.    Baseline See MMT    Time 8    Period Weeks    Status On-going    Target Date 09/09/21                   Plan - 09/04/21 1614     Clinical Impression Statement Patient continues to have difficulty with LLE coordination during WB activity and ambulation. Patient did improve ambulatory distance with walking first during todays session, and using large base quad cane. Patient required verbal cues and active assist for active hip flexion during lateral step over obstacles. Decreased performance with visible fatigue, but some benefit noted following active assist. Patient will continue to benefit from skilled therapy service to progress gait mechanics for improved functional mobility.    Examination-Activity Limitations Caring for Others;Transfers;Locomotion Level;Toileting;Stand    Examination-Participation Restrictions Yard Work;Cleaning;Laundry;Community Activity    Stability/Clinical Decision Making Evolving/Moderate complexity    Rehab Potential Fair    PT Frequency 2x / week    PT Duration 4 weeks    PT Treatment/Interventions ADLs/Self Care Home Management;Aquatic Therapy;Biofeedback;Cryotherapy;Fluidtherapy;Contrast Bath;Electrical Stimulation;Therapeutic exercise;Orthotic Fit/Training;Patient/family education;Manual lymph drainage;Manual techniques;Dry needling;Energy conservation;Spinal Manipulations;Joint Manipulations;Splinting;Taping;Compression bandaging;Vasopneumatic Device;Therapeutic activities;Parrafin;Ultrasound;Traction;Functional mobility training;Neuromuscular re-education;Stair training;Gait training;Moist Heat;Iontophoresis 4mg /ml Dexamethasone;DME Instruction;Balance training;Passive range of motion;Scar mobilization;Wheelchair mobility training;Visual/perceptual remediation/compensation    PT Next Visit Plan  Reassess. Progress functional mobility as tolerated. Encourage WB through LT side. Gait. F/U about AFO fitting    PT Home Exercise Plan Eval: marches, LAQs, heel/toe raise; 7/1:  bridge and sidelying clam 7/12 heel slide, supine hip abduction; tall sitting with lumbar support    Consulted and Agree with Plan of Care Patient             Patient will benefit from skilled therapeutic intervention in order to improve the following deficits and impairments:  Abnormal gait, Improper body mechanics, Impaired sensation, Decreased balance, Difficulty walking, Decreased activity tolerance, Decreased range of motion, Decreased strength, Hypomobility, Impaired tone, Decreased mobility, Decreased coordination  Visit Diagnosis: Muscle weakness (generalized)  Difficulty in walking, not elsewhere classified  Other symptoms and signs involving  the musculoskeletal system  Other abnormalities of gait and mobility     Problem List Patient Active Problem List   Diagnosis Date Noted   Urinary frequency    Dysphagia, post-stroke    Spastic hemiplegia affecting nondominant side (HCC)    Drug-induced hypotension    Pressure injury of skin 02/14/2021   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   Dyslipidemia    Hemiparesis affecting left side as late effect of stroke ([place])    Middle cerebral artery embolism, right 02/03/2021   Status post surgery 02/02/2021   Stroke (cerebrum) ([place]) 02/02/2021   Hypoglycemia 01/25/2021   Thyroid nodule 01/25/2021   Hypothyroidism 01/25/2021   Near syncope 01/21/2021   H/O adenomatous polyp of colon 12/30/2020   Acute ischemic stroke ([place]) 11/28/2020   Essential hypertension 11/28/2020   Mixed hyperlipidemia 11/28/2020   Acute CVA (cerebrovascular accident) ([place]) 09/09/2020   Transient ischemic attack 09/07/2020   History of hypertension 09/07/2020   Elevated troponin 09/07/2020   Dizziness 09/07/2020   Numbness and tingling 09/07/2020   H/O  medication noncompliance 09/07/2020   Medicare annual wellness visit, subsequent 12/05/2019   Nicotine dependence 12/05/2019   Chronic kidney disease, stage 3 unspecified ([place]) 10/24/2019   Prediabetes 10/24/2019   Edema of foot 09/14/2017   Tubular adenoma of colon 09/14/2017   Long term (current) use of anticoagulants 11/14/2014   Sickle cell trait ([place]) 08/29/2012   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 [place], [place], [address] Phone: [phone]   Fax:  [phone]  Name: Matthew Wise MRN: [id number] Date of Birth: [birth year]

## 2021-09-05 ENCOUNTER — Telehealth: Payer: Self-pay

## 2021-09-05 DIAGNOSIS — D6851 Activated protein C resistance: Secondary | ICD-10-CM

## 2021-09-05 DIAGNOSIS — Z8673 Personal history of transient ischemic attack (TIA), and cerebral infarction without residual deficits: Secondary | ICD-10-CM

## 2021-09-05 DIAGNOSIS — I10 Essential (primary) hypertension: Secondary | ICD-10-CM

## 2021-09-05 DIAGNOSIS — Z7901 Long term (current) use of anticoagulants: Secondary | ICD-10-CM

## 2021-09-05 MED ORDER — DABIGATRAN ETEXILATE MESYLATE 150 MG PO CAPS: 150.0000 mg | ORAL_CAPSULE | Freq: Two times a day (BID) | ORAL | 0 refills | Status: DC

## 2021-09-05 NOTE — Therapy (Signed)
[place], [place], [address] Phone: [phone]   Fax:  [phone]  Occupational Therapy Treatment  Patient Details  Name: Matthew Wise MRN: [id number] Date of Birth: 1961/09/04 Referring Provider (OT): Alysia Penna, MD   Encounter Date: 09/04/2021   OT End of Session - 09/05/21 1843     Visit Number 27    Number of Visits 34    Date for OT Re-Evaluation 09/18/21    Authorization Type UHC medicare    Authorization Time Period 11/30/20-end of year. $30 copay. 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 two simple tubular adenomas removed. patient was advised to go to [facility] for colonoscopy but he did not keep appt.   IR CT HEAD LTD  02/02/2021   IR PERCUTANEOUS ART THROMBECTOMY/INFUSION INTRACRANIAL INC DIAG ANGIO  02/02/2021    RADIOLOGY WITH ANESTHESIA N/A 02/02/2021   Procedure: IR WITH ANESTHESIA;  Surgeon: Luanne Bras, MD;  Location: [facility];  Service: Radiology;  Laterality: N/A;   TEE WITHOUT CARDIOVERSION N/A 09/10/2020   Procedure: TRANSESOPHAGEAL ECHOCARDIOGRAM (TEE) WITH PROPOFOL;  Surgeon: Satira Sark, MD;  Location: [facility];  Service: Cardiovascular;  Laterality: N/A;    There were no vitals filed for this visit.   Subjective Assessment - 09/04/21 1446     Subjective  S: nothing new to report.    Currently in Pain? No/denies                [facility] OT Assessment - 09/05/21 1840       Assessment   Medical Diagnosis S/P right MCA with left hemiparesis      Precautions   Precautions Fall    Precaution Comments left side hemiparesis, left side visual field deficits                      OT Treatments/Exercises (OP) - 09/04/21 1446       Exercises   Exercises Shoulder      Shoulder Exercises: Seated   Other Seated Exercises table slide; IR/er with forearm on towel. 10X      Neurological Re-education Exercises   Towel Slides Shoulder flexion via table slide completed to focus on elbow extension. Pt completed with  right hand on top of left hand seated at table; 12X. Scooter board used to provide AA/ROM shoulder flexion and elbow flexion/extension. Stability of scooter board provided by OT. Focused on seated posture and postural control while completing shoulder flexion combined with elbow flexion/extension (LUE on towel).                    OT Education - 09/05/21 1842     Education Details seated and standing posture visual provided for HEP    Person(s) Educated Patient    Methods Explanation;Demonstration;Verbal cues;Tactile cues;Handout    Comprehension Returned demonstration;Verbalized understanding;Need further instruction              OT Short Term Goals - 08/26/21 1543       OT SHORT TERM GOAL #1   Title Patient will be educated and  independent with HEP completion in order to faciliate his progress in therapy and begin to use his LUE as a gross assist for 25% or more of daily tasks.    Time 6    Period Weeks    Target Date 06/25/21      OT SHORT TERM GOAL #2   Title Patient will demonstrate a 25% increase in A/ROM of the LUE (shoulder, elbow, wrist) as able while using his LUE as a gross assist when completing self care tasks such as dressing.    Time 6    Period Weeks      OT SHORT TERM GOAL #3   Title Patient will demonstrate increased fine motor coordination in his left hand by completing the 9 hole peg test with peg set-up if needed.    Baseline 9/8: Unable to complete even with ste up provided.    Time 6    Period Weeks    Status On-going      OT SHORT TERM GOAL #4   Title Patient will increase is left hand strength to 10# and his pinch strength to 5# in order to begin to use his left hand to stabilize objects when completing self feeding.    Baseline 9/8: grip strength: 7#, lateral pinch 2#, 3 point pinch: 2#    Time 6    Period Weeks    Status On-going      OT SHORT TERM GOAL #5   Title Patient will completing bathing and dressing with Mod assist where needed while utilizing DME, Adaptive equipment, and/or compensatory techniques with improved sitting and standing balance.    Time 6    Period Weeks               OT Long Term Goals - 05/16/21 1415       OT LONG TERM GOAL #1   Title Patient will demonstrate improved LUE A/ROM by 50% or more while utilizing his LUE as his non dominant extremity to provide an active assist to complete tasks that require use of bilateral UE's.    Time 12    Period Weeks    Status On-going      OT LONG TERM GOAL #2   Title Patient will increase his LUE strength (shoulder, elbow, wrist) to 4/5 or better while demonstrating improved ability to complete bathing and dressing tasks.    Time 12    Period Weeks    Status On-going      OT LONG TERM GOAL #3   Title  Patient will increase his fine motor coordination in his left hand by completing the 9 hole peg test in  the standardized fashion under 1'.    Time 12    Period Weeks    Status On-going      OT LONG TERM GOAL #4   Title Patient will demonstrate improved left hand grip strength to 15# and pinch strength to 8# in order to manipulate and hold onto items in his left hand without dropping.    Time 12    Period Weeks    Status On-going      OT LONG TERM GOAL #5   Title Patient will completing bathing and dressing with min assist where needed while utilizing DME, Adaptive equipment, and/or compensatory techniques with improved sitting and standing balance.    Status On-going                   Plan - 09/05/21 1843     Clinical Impression Statement A: Required consistent verbal cueing to complete tasks with proper form and technique. Handling skills provided to faciliate correct seated posture and muscle activation of the left UE. Continues to have decreased active digit extension with less movement/range noted with increased repetitions. Flexion continues to be more functional when forearm is supinated.    Body Structure / Function / Physical Skills ADL;Decreased knowledge of use of DME;Strength;Tone;GMC;Balance;UE functional use;Vision;ROM;IADL;Sensation;Coordination;FMC    Plan P: Continue with NMES unit to focus on grasp and release. low level reaching.    Consulted and Agree with Plan of Care Patient             Patient will benefit from skilled therapeutic intervention in order to improve the following deficits and impairments:   Body Structure / Function / Physical Skills: ADL, Decreased knowledge of use of DME, Strength, Tone, GMC, Balance, UE functional use, Vision, ROM, IADL, Sensation, Coordination, [facility]       Visit Diagnosis: Other symptoms and signs involving the musculoskeletal system  Other lack of coordination  Stiffness of left hand, not elsewhere  classified  Stiffness of left elbow, not elsewhere classified    Problem List Patient Active Problem List   Diagnosis Date Noted   Urinary frequency    Dysphagia, post-stroke    Spastic hemiplegia affecting nondominant side (HCC)    Drug-induced hypotension    Pressure injury of skin 02/14/2021   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   Dyslipidemia    Hemiparesis affecting left side as late effect of stroke ([place])    Middle cerebral artery embolism, right 02/03/2021   Status post surgery 02/02/2021   Stroke (cerebrum) ([place]) 02/02/2021   Hypoglycemia 01/25/2021   Thyroid nodule 01/25/2021   Hypothyroidism 01/25/2021   Near syncope 01/21/2021   H/O adenomatous polyp of colon 12/30/2020   Acute ischemic stroke ([place]) 11/28/2020   Essential hypertension 11/28/2020   Mixed hyperlipidemia 11/28/2020   Acute CVA (cerebrovascular accident) ([place]) 09/09/2020   Transient ischemic attack 09/07/2020   History of hypertension 09/07/2020   Elevated troponin 09/07/2020   Dizziness 09/07/2020   Numbness and tingling 09/07/2020   H/O medication noncompliance 09/07/2020   Medicare annual wellness visit, subsequent 12/05/2019   Nicotine dependence 12/05/2019   Chronic kidney disease, stage 3 unspecified ([place]) 10/24/2019   Prediabetes 10/24/2019   Edema of foot 09/14/2017   Tubular adenoma of colon 09/14/2017   Long term (current) use of anticoagulants 11/14/2014   Sickle cell trait ([place]) 08/29/2012   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

## 2021-09-05 NOTE — Telephone Encounter (Signed)
Patient sister Inez Catalina called and patient will be out of Pradaxa after today. He was seeing Dr. Anastasio Champion but he passed away so his NP Jeralyn Ruths refilled Pradaxa on 04/10/21 with 3 refills. Inez Catalina states that she has tried IT consultant  and the pharmacy as well but no response and she doesn't know what to do.

## 2021-09-05 NOTE — Telephone Encounter (Signed)
Return Ms. Matthew Wise call, she reports [name] only has two tablets of Pradaxa. She states she and the pharmacy have called Matthew Wise office, no return call. This provider called Matthew Wise office voice mail reports the office have closed, and for the patients to find a PCP. Ms. Matthew Wise states Matthew Wise passed away. [facility] and was [facility] Prad[facility]a 150 mg every 12 hours.  Discharge Summary was reviewed. She also states he has a scheduled appointment with a new provider Matthew Matthew Wise in [place] on 09/24/2021.  [facility] [place], [place], this was  discussed with Ms. Matthew Wise. She verbalizes understanding.  Ms. Matthew Wise states [name] has been compliant with medication.

## 2021-09-08 ENCOUNTER — Other Ambulatory Visit (INDEPENDENT_AMBULATORY_CARE_PROVIDER_SITE_OTHER): Payer: Self-pay | Admitting: Nurse Practitioner

## 2021-09-08 DIAGNOSIS — Z7901 Long term (current) use of anticoagulants: Secondary | ICD-10-CM

## 2021-09-08 DIAGNOSIS — N1831 Chronic kidney disease, stage 3a: Secondary | ICD-10-CM

## 2021-09-08 DIAGNOSIS — I10 Essential (primary) hypertension: Secondary | ICD-10-CM

## 2021-09-08 DIAGNOSIS — Z1211 Encounter for screening for malignant neoplasm of colon: Secondary | ICD-10-CM

## 2021-09-08 DIAGNOSIS — R7303 Prediabetes: Secondary | ICD-10-CM

## 2021-09-09 ENCOUNTER — Ambulatory Visit (HOSPITAL_COMMUNITY): Payer: Medicare Other

## 2021-09-09 ENCOUNTER — Telehealth: Payer: Self-pay | Admitting: Internal Medicine

## 2021-09-09 ENCOUNTER — Encounter (HOSPITAL_COMMUNITY): Payer: Self-pay

## 2021-09-09 ENCOUNTER — Other Ambulatory Visit: Payer: Self-pay

## 2021-09-09 DIAGNOSIS — M6281 Muscle weakness (generalized): Secondary | ICD-10-CM | POA: Diagnosis not present

## 2021-09-09 DIAGNOSIS — R2689 Other abnormalities of gait and mobility: Secondary | ICD-10-CM

## 2021-09-09 DIAGNOSIS — R278 Other lack of coordination: Secondary | ICD-10-CM

## 2021-09-09 DIAGNOSIS — R262 Difficulty in walking, not elsewhere classified: Secondary | ICD-10-CM

## 2021-09-09 DIAGNOSIS — R29898 Other symptoms and signs involving the musculoskeletal system: Secondary | ICD-10-CM

## 2021-09-09 DIAGNOSIS — M25642 Stiffness of left hand, not elsewhere classified: Secondary | ICD-10-CM

## 2021-09-09 DIAGNOSIS — M25622 Stiffness of left elbow, not elsewhere classified: Secondary | ICD-10-CM

## 2021-09-09 NOTE — Telephone Encounter (Signed)
Pt called in to see if he can get refills for Amlodipine and cholesterol meds. Pt is scheduled to see Posey Pronto on 10/26. Will run out before then. Reach out with any further details [facility].

## 2021-09-09 NOTE — Therapy (Signed)
[place], [place], [address] Phone: [phone]   Fax:  [phone]  Physical Therapy Treatment, Progress Note, and Recertification  Patient Details  Name: Matthew Wise MRN: [id number] Date of Birth: [birth year] Referring Provider (PT): Alysia Penna MD  Progress Note Reporting Period 08/05/21 to 09/09/21  See note below for Objective Data and Assessment of Progress/Goals.     Encounter Date: 09/09/2021   PT End of Session - 09/09/21 1345     Visit Number 28    Number of Visits 4    Date for PT Re-Evaluation 10/21/21    Authorization Type UHC MEdicare    Progress Note Due on Visit 65    PT Start Time 1345    PT Stop Time 1430    PT Time Calculation (min) 45 min    Equipment Utilized During Treatment Gait belt    Activity Tolerance Patient tolerated treatment well    Behavior During Therapy WFL for tasks assessed/performed             Past Medical History:  Diagnosis Date   Acute CVA (cerebrovascular accident) ([place]) 09/09/2020   Chronic anticoagulation 11/14/2014   CKD (chronic kidney disease) stage 3, GFR 30-59 ml/min ([place]) 10/24/2019   CKD (chronic kidney disease) stage 3, GFR 30-59 ml/min ([place]) 10/24/2019   Clotting disorder ([place])    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 two simple tubular adenomas removed. patient was advised to go to [facility] for colonoscopy but he  did not keep appt.   IR CT HEAD LTD  02/02/2021   IR PERCUTANEOUS ART THROMBECTOMY/INFUSION INTRACRANIAL INC DIAG ANGIO  02/02/2021   RADIOLOGY WITH ANESTHESIA N/A 02/02/2021   Procedure: IR WITH ANESTHESIA;  Surgeon: Luanne Bras, MD;  Location: [facility];  Service: Radiology;  Laterality: N/A;   TEE WITHOUT CARDIOVERSION N/A 09/10/2020   Procedure: TRANSESOPHAGEAL ECHOCARDIOGRAM (TEE) WITH PROPOFOL;  Surgeon: Satira Sark, MD;  Location: [facility];  Service: Cardiovascular;  Laterality: N/A;    There were no vitals filed for this visit.   Subjective Assessment - 09/09/21 1413     Subjective No issues. Looking forward to getting AFO. Reports he has been walking more at home.    Pertinent History CVA 02/02/21    Currently in Pain? No/denies                [facility] PT Assessment - 09/09/21 1347       Assessment   Medical Diagnosis S/P right MCA with left hemiparesis    Referring Provider (PT) Alysia Penna MD    Next MD Visit 10/07/21      Strength   Left Hip Flexion 3/5    Left Hip External Rotation 2+/5    Left Hip ABduction 2+/5    Left Hip ADduction 3-/5    Left Knee Flexion 3/5    Left Knee Extension 3-/5    Left Ankle Dorsiflexion 2-/5  Left Ankle Plantar Flexion 2+/5      Bed Mobility   Supine to Sit Independent    Sit to Supine Independent      Transfers   Transfers Sit to Stand;Stand Pivot Transfers    Sit to Stand 6: Modified independent (Device/Increase time)    Stand Pivot Transfers 6: Modified independent (Device/Increase time)      Standardized Balance Assessment   Standardized Balance Assessment Berg Balance Test      Berg Balance Test   Sit to Stand Able to stand  independently using hands    Standing Unsupported Able to stand 2 minutes with supervision    Sitting with Back Unsupported but Feet Supported on Floor or Stool Able to sit safely and securely 2 minutes    Stand to Sit Sits safely with minimal use of hands    Transfers Able to transfer  safely, definite need of hands    Standing Unsupported with Eyes Closed Able to stand 10 seconds with supervision    Standing Unsupported with Feet Together Able to place feet together independently and stand for 1 minute with supervision    From Standing, Reach Forward with Outstretched Arm Can reach forward >12 cm safely (5")    From Standing Position, Pick up Object from Floor Able to pick up shoe, needs supervision    From Standing Position, Turn to Look Behind Over each Shoulder Needs supervision when turning    Turn 360 Degrees Needs assistance while turning    Standing Unsupported, Alternately Place Feet on Step/Stool Needs assistance to keep from falling or unable to try    Standing Unsupported, One Foot in Front Needs help to step but can hold 15 seconds    Standing on One Leg Tries to lift leg/unable to hold 3 seconds but remains standing independently    Total Score 32                           [facility] Adult PT Treatment/Exercise - 09/09/21 1347       Knee/Hip Exercises: Supine   Quad Sets Strengthening;Left;3 sets;10 reps    Short Arc Quad Sets Strengthening;Right;Left;3 sets;10 reps    Short Arc [employer] Limitations 10# RLE, 2 # LLE    Heel Slides Strengthening;Left;3 sets;10 reps    Heel Slides Limitations therapist assist    Bridges Strengthening;Both;3 sets;10 reps    Bridges Limitations LLE facilitation    Other Supine Knee/Hip Exercises hip abd/add isometric in hooklying 3x10                     PT Education - 09/09/21 1414     Education Details education/discussion on [employer] test and fall risk    Person(s) Educated Patient    Methods Explanation    Comprehension Verbalized understanding              PT Short Term Goals - 09/09/21 1400       PT SHORT TERM GOAL #1   Title Patient will be independent with initial HEP and self-management strategies to improve functional outcomes    Baseline "walking and standing mainly  using quad cane"    Time 4    Period Weeks    Status On-going    Target Date 09/30/21      PT SHORT TERM GOAL #2   Title Patient will be Min A with transfers and bed mobility    Time 4  Period Weeks    Status Achieved    Target Date 06/19/21      PT SHORT TERM GOAL #3   Title Decrease risk for falls per score 45/56 Berg Balance test    Baseline 32/56 indicating high risk for falls    Time 3    Period Weeks    Status New    Target Date 09/30/21               PT Long Term Goals - 09/09/21 1411       PT LONG TERM GOAL #1   Title Patient will be Mod I with bed mobility and transfers    Baseline Mod independent bed mobility; set-up to modified indep. transfers    Time 8    Period Weeks    Status Achieved      PT LONG TERM GOAL #2   Title Patient will be able to ambulate >100 feet with LRAD for improved household mobility    Baseline Current 115 feet with HW    Time 8    Period Weeks    Status Achieved      PT LONG TERM GOAL #3   Title Patient will have at least 4/5 MMT throughout LLE (except ankle DF) for improved functional mobility and stair/ step navigation.    Baseline See MMT    Time 8    Period Weeks    Status On-going    Target Date 10/21/21                   Plan - 09/09/21 1514     Clinical Impression Statement Progressing with POC details and demonstrating greater strength/power development in LLE especially with active hip flexion for lifting limb to 6" step. Berg Balance test performed and scores 32/56 indicating high risk for falls.  Overall, Mr. Withey has made considerable functional gains in his mobility and safety requiring less physical assistance in bed mobility, transfers, and progressing with ambulation.  He was recently assessed/fit for an AFO and will hopefully have device soon to progress gait and balance activities. Continued sessions indicated to continue with STG/LTG and reduce risk for falls    Examination-Activity  Limitations Caring for Others;Transfers;Locomotion Level;Toileting;Stand    Examination-Participation Restrictions Yard Work;Cleaning;Laundry;Community Activity    Stability/Clinical Decision Making Evolving/Moderate complexity    Rehab Potential Fair    PT Frequency 2x / week    PT Duration 4 weeks    PT Treatment/Interventions ADLs/Self Care Home Management;Aquatic Therapy;Biofeedback;Cryotherapy;Fluidtherapy;Contrast Bath;Electrical Stimulation;Therapeutic exercise;Orthotic Fit/Training;Patient/family education;Manual lymph drainage;Manual techniques;Dry needling;Energy conservation;Spinal Manipulations;Joint Manipulations;Splinting;Taping;Compression bandaging;Vasopneumatic Device;Therapeutic activities;Parrafin;Ultrasound;Traction;Functional mobility training;Neuromuscular re-education;Stair training;Gait training;Moist Heat;Iontophoresis 4mg /ml Dexamethasone;DME Instruction;Balance training;Passive range of motion;Scar mobilization;Wheelchair mobility training;Visual/perceptual remediation/compensation    PT Next Visit Plan Reassess. Progress functional mobility as tolerated. Encourage WB through LT side. Gait. F/U about AFO fitting    PT Home Exercise Plan Eval: marches, LAQs, heel/toe raise; 7/1:  bridge and sidelying clam 7/12 heel slide, supine hip abduction; tall sitting with lumbar support    Consulted and Agree with Plan of Care Patient             Patient will benefit from skilled therapeutic intervention in order to improve the following deficits and impairments:  Abnormal gait, Improper body mechanics, Impaired sensation, Decreased balance, Difficulty walking, Decreased activity tolerance, Decreased range of motion, Decreased strength, Hypomobility, Impaired tone, Decreased mobility, Decreased coordination  Visit Diagnosis: Other symptoms and signs involving the musculoskeletal system  Other lack of coordination  Muscle weakness (generalized)  Difficulty in walking, not  elsewhere classified  Other abnormalities of gait and mobility     Problem List Patient Active Problem List   Diagnosis Date Noted   Urinary frequency    Dysphagia, post-stroke    Spastic hemiplegia affecting nondominant side (HCC)    Drug-induced hypotension    Pressure injury of skin 02/14/2021   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   Dyslipidemia    Hemiparesis affecting left side as late effect of stroke ([place])    Middle cerebral artery embolism, right 02/03/2021   Status post surgery 02/02/2021   Stroke (cerebrum) ([place]) 02/02/2021   Hypoglycemia 01/25/2021   Thyroid nodule 01/25/2021   Hypothyroidism 01/25/2021   Near syncope 01/21/2021   H/O adenomatous polyp of colon 12/30/2020   Acute ischemic stroke ([place]) 11/28/2020   Essential hypertension 11/28/2020   Mixed hyperlipidemia 11/28/2020   Acute CVA (cerebrovascular accident) ([place]) 09/09/2020   Transient ischemic attack 09/07/2020   History of hypertension 09/07/2020   Elevated troponin 09/07/2020   Dizziness 09/07/2020   Numbness and tingling 09/07/2020   H/O medication noncompliance 09/07/2020   Medicare annual wellness visit, subsequent 12/05/2019   Nicotine dependence 12/05/2019   Chronic kidney disease, stage 3 unspecified ([place]) 10/24/2019   Prediabetes 10/24/2019   Edema of foot 09/14/2017   Tubular adenoma of colon 09/14/2017   Long term (current) use of anticoagulants 11/14/2014   Sickle cell trait ([place]) 08/29/2012   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 Sherlyn Lees, PT, DPT Physical Therapist-  Office Number: [phone]   [facility] [address] [place], [place], [address] Phone: [phone]   Fax:   [phone]  Name: Matthew Wise MRN: [id number] Date of Birth: 1961-04-16

## 2021-09-09 NOTE — Therapy (Signed)
[place], [place], [address] Phone: [phone]   Fax:  [phone]  Occupational Therapy Treatment  Patient Details  Name: Matthew Wise MRN: [id number] Date of Birth: [birth year] Referring Provider (OT): Alysia Penna, MD   Encounter Date: 09/09/2021   OT End of Session - 09/09/21 1550     Visit Number 28    Number of Visits 34    Date for OT Re-Evaluation 09/18/21    Authorization Type U[employer] medicare    Authorization Time Period 11/30/20-end of year. $30 copay. 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 two simple tubular adenomas removed. patient was advised to go to [facility] for colonoscopy but he did not keep appt.   IR CT HEAD LTD  02/02/2021   IR PERCUTANEOUS ART THROMBECTOMY/INFUSION INTRACRANIAL INC DIAG ANGIO  02/02/2021    RADIOLOGY WITH ANESTHESIA N/A 02/02/2021   Procedure: IR WITH ANESTHESIA;  Surgeon: Luanne Bras, MD;  Location: [facility];  Service: Radiology;  Laterality: N/A;   TEE WITHOUT CARDIOVERSION N/A 09/10/2020   Procedure: TRANSESOPHAGEAL ECHOCARDIOGRAM (TEE) WITH PROPOFOL;  Surgeon: Satira Sark, MD;  Location: [facility];  Service: Cardiovascular;  Laterality: N/A;    There were no vitals filed for this visit.   Subjective Assessment - 09/09/21 1546     Subjective  S: Requested the contact information for the [employer].    Currently in Pain? No/denies                [facility] OT Assessment - 09/09/21 1546       Assessment   Medical Diagnosis S/P right MCA with left hemiparesis      Precautions   Precautions Fall    Precaution Comments left side hemiparesis, left side visual field deficits                      OT Treatments/Exercises (OP) - 09/09/21 1547       Exercises   Exercises Shoulder      Neurological Re-education Exercises   Shoulder Flexion AAROM;Supine;Other (comment)   completed initially with NMES for 8' treatment. Once NMES treatment time was completed completed with OT unweighting patient arm.   Shoulder Protraction AAROM;Supine;Other (comment)  completed initially with NMES for 8' treatment. Once NMES treatment time was completed completed with OT unweighting patient arm.   Shoulder External Rotation PROM;Left;10 reps;Supine    Shoulder Internal Rotation PROM;Left;10 reps;Supine      Modalities   Modalities Teacher, English as a foreign language Location left anterior shoulder    Chartered certified accountant Russian    Electrical Stimulation Parameters 34 mA CC 8' 10/10 cycle Completed two trials    Electrical Stimulation Goals Neuromuscular facilitation                      OT Short Term Goals - 08/26/21 1543       OT SHORT TERM GOAL #1   Title Patient will be educated and  independent with HEP completion in order to faciliate his progress in therapy and begin to use his LUE as a gross assist for 25% or more of daily tasks.    Time 6    Period Weeks    Target Date 06/25/21      OT SHORT TERM GOAL #2   Title Patient will demonstrate a 25% increase in A/ROM of the LUE (shoulder, elbow, wrist) as able while using his LUE as a gross assist when completing self care tasks such as dressing.    Time 6    Period Weeks      OT SHORT TERM GOAL #3   Title Patient will demonstrate increased fine motor coordination in his left hand by completing the 9 hole peg test with peg set-up if needed.    Baseline 9/8: Unable to complete even with ste up provided.    Time 6    Period Weeks    Status On-going      OT SHORT TERM GOAL #4   Title Patient will increase is left hand strength to 10# and his pinch strength to 5# in order to begin to use his left hand to stabilize objects when completing self feeding.    Baseline 9/8: grip strength: 7#, lateral pinch 2#, 3 point pinch: 2#    Time 6    Period Weeks    Status On-going      OT SHORT TERM GOAL #5   Title Patient will completing bathing and dressing with Mod assist where needed while utilizing DME, Adaptive equipment, and/or compensatory techniques with improved sitting and standing balance.    Time 6    Period Weeks               OT Long Term Goals - 05/16/21 1415       OT LONG TERM GOAL #1   Title Patient will demonstrate improved LUE A/ROM by 50% or more while utilizing his LUE as his non dominant extremity to provide an active assist to complete tasks that require use of bilateral UE's.    Time 12    Period Weeks    Status On-going      OT LONG TERM GOAL #2   Title Patient will increase his LUE strength (shoulder, elbow, wrist) to 4/5 or better while demonstrating improved ability to complete bathing and dressing tasks.    Time 12    Period Weeks    Status On-going      OT LONG TERM GOAL #3   Title  Patient will increase his fine motor coordination in his left hand by completing the 9 hole peg test in the standardized fashion under 1'.    Time  12    Period Weeks    Status On-going      OT LONG TERM GOAL #4   Title Patient will demonstrate improved left hand grip strength to 15# and pinch strength to 8# in order to manipulate and hold onto items in his left hand without dropping.    Time 12    Period Weeks    Status On-going      OT LONG TERM GOAL #5   Title Patient will completing bathing and dressing with min assist where needed while utilizing DME, Adaptive equipment, and/or compensatory techniques with improved sitting and standing balance.    Status On-going                   Plan - 09/09/21 1611     Clinical Impression Statement A: Session focused on use of NMES initially to provide nueromuscular strengthening to the left shoulder while supine. Two trials were completed while providing consistent verbal cues to complete motion with unit then hold for 10 seconds followed by slowly bringing the extremity back down to starting potion.    Body Structure / Function / Physical Skills ADL;Decreased knowledge of use of DME;Strength;Tone;GMC;Balance;UE functional use;Vision;ROM;IADL;Sensation;Coordination;FMC    Plan P: Continue with NMES unit to focus on AA/ROM of left shoulder. low level reaching.    Consulted and Agree with Plan of Care Patient             Patient will benefit from skilled therapeutic intervention in order to improve the following deficits and impairments:   Body Structure / Function / Physical Skills: ADL, Decreased knowledge of use of DME, Strength, Tone, GMC, Balance, UE functional use, Vision, ROM, IADL, Sensation, Coordination, [facility]       Visit Diagnosis: Other lack of coordination  Other symptoms and signs involving the musculoskeletal system  Stiffness of left hand, not elsewhere classified  Stiffness of left elbow, not elsewhere  classified    Problem List Patient Active Problem List   Diagnosis Date Noted   Urinary frequency    Dysphagia, post-stroke    Spastic hemiplegia affecting nondominant side (HCC)    Drug-induced hypotension    Pressure injury of skin 02/14/2021   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   Dyslipidemia    Hemiparesis affecting left side as late effect of stroke ([place])    Middle cerebral artery embolism, right 02/03/2021   Status post surgery 02/02/2021   Stroke (cerebrum) ([place]) 02/02/2021   Hypoglycemia 01/25/2021   Thyroid nodule 01/25/2021   Hypothyroidism 01/25/2021   Near syncope 01/21/2021   H/O adenomatous polyp of colon 12/30/2020   Acute ischemic stroke ([place]) 11/28/2020   Essential hypertension 11/28/2020   Mixed hyperlipidemia 11/28/2020   Acute CVA (cerebrovascular accident) ([place]) 09/09/2020   Transient ischemic attack 09/07/2020   History of hypertension 09/07/2020   Elevated troponin 09/07/2020   Dizziness 09/07/2020   Numbness and tingling 09/07/2020   H/O medication noncompliance 09/07/2020   Medicare annual wellness visit, subsequent 12/05/2019   Nicotine dependence 12/05/2019   Chronic kidney disease, stage 3 unspecified ([place]) 10/24/2019   Prediabetes 10/24/2019   Edema of foot 09/14/2017   Tubular adenoma of colon 09/14/2017   Long term (current) use of anticoagulants 11/14/2014   Sickle cell trait ([place]) 08/29/2012   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 [place], [place], [address] Phone: [phone]   Fax:  [phone]  Name: Matthew Wise MRN: [id number] Date of Birth: Aug 07, 1961

## 2021-09-10 NOTE — Telephone Encounter (Signed)
Pt called back asking for refills.  I advised that we would not refill medication until the patient is seen in the office

## 2021-09-10 NOTE — Telephone Encounter (Signed)
   Patient requesting one time refill until he can be established with new provider Amlodipine Atorvastatin

## 2021-09-11 ENCOUNTER — Ambulatory Visit (HOSPITAL_COMMUNITY): Payer: Medicare Other | Admitting: Physical Therapy

## 2021-09-11 ENCOUNTER — Encounter (HOSPITAL_COMMUNITY): Payer: Self-pay

## 2021-09-11 ENCOUNTER — Other Ambulatory Visit: Payer: Self-pay

## 2021-09-11 ENCOUNTER — Ambulatory Visit (HOSPITAL_COMMUNITY): Payer: Medicare Other

## 2021-09-11 DIAGNOSIS — R278 Other lack of coordination: Secondary | ICD-10-CM

## 2021-09-11 DIAGNOSIS — M25642 Stiffness of left hand, not elsewhere classified: Secondary | ICD-10-CM

## 2021-09-11 DIAGNOSIS — R29898 Other symptoms and signs involving the musculoskeletal system: Secondary | ICD-10-CM

## 2021-09-11 DIAGNOSIS — M6281 Muscle weakness (generalized): Secondary | ICD-10-CM | POA: Diagnosis not present

## 2021-09-11 DIAGNOSIS — R262 Difficulty in walking, not elsewhere classified: Secondary | ICD-10-CM

## 2021-09-11 DIAGNOSIS — M25622 Stiffness of left elbow, not elsewhere classified: Secondary | ICD-10-CM

## 2021-09-11 NOTE — Therapy (Signed)
[place], [place], [address] Phone: [phone]   Fax:  [phone]  Occupational Therapy Treatment  Patient Details  Name: Matthew Wise MRN: [id number] Date of Birth: 1961/10/20 Referring Provider (OT): Alysia Penna, MD   Encounter Date: 09/11/2021   OT End of Session - 09/11/21 1411     Visit Number 29    Number of Visits 34    Date for OT Re-Evaluation 09/18/21    Authorization Type [employer] medicare    Authorization Time Period 11/30/20-end of year. $30 copay. No visit limit    Progress Note Due on Visit 109    OT Start Time 1318   Pt was checked in at 1:11 although did not show up as checked until later.   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 two simple tubular adenomas removed. patient was advised to go to [facility] for colonoscopy but he did not keep appt.   IR CT HEAD LTD  02/02/2021   IR  PERCUTANEOUS ART THROMBECTOMY/INFUSION INTRACRANIAL INC DIAG ANGIO  02/02/2021   RADIOLOGY WITH ANESTHESIA N/A 02/02/2021   Procedure: IR WITH ANESTHESIA;  Surgeon: Luanne Bras, MD;  Location: [facility];  Service: Radiology;  Laterality: N/A;   TEE WITHOUT CARDIOVERSION N/A 09/10/2020   Procedure: TRANSESOPHAGEAL ECHOCARDIOGRAM (TEE) WITH PROPOFOL;  Surgeon: Satira Sark, MD;  Location: [facility];  Service: Cardiovascular;  Laterality: N/A;    There were no vitals filed for this visit.   Subjective Assessment - 09/11/21 1333     Subjective  S: Nothing new to report.    Currently in Pain? No/denies                [facility] OT Assessment - 09/11/21 1409       Assessment   Medical Diagnosis S/P right MCA with left hemiparesis      Precautions   Precautions Fall    Precaution Comments left side hemiparesis, left side visual field deficits                      OT Treatments/Exercises (OP) - 09/11/21 1409       Exercises   Exercises Shoulder      Functional Reaching Activities   Low Level Completed low level reaching while standing using ring tree. yellow and red level used. Worked on active movement with  OT providing active assist and unweighted shoulder as needed to place hand on top of bar or through ring and slide off pole.                      OT Short Term Goals - 08/26/21 1543       OT SHORT TERM GOAL #1   Title Patient will be educated and independent with HEP completion in order to faciliate his progress in therapy and begin to use his LUE as a gross assist for 25% or more of daily tasks.    Time 6    Period Weeks    Target Date 06/25/21      OT SHORT TERM GOAL #2   Title Patient will demonstrate a 25% increase in A/ROM of the LUE (shoulder, elbow, wrist) as able while using his LUE as a gross assist when completing self care tasks such as dressing.    Time 6    Period Weeks      OT SHORT TERM GOAL #3   Title Patient will demonstrate  increased fine motor coordination in his left hand by completing the 9 hole peg test with peg set-up if needed.    Baseline 9/8: Unable to complete even with ste up provided.    Time 6    Period Weeks    Status On-going      OT SHORT TERM GOAL #4   Title Patient will increase is left hand strength to 10# and his pinch strength to 5# in order to begin to use his left hand to stabilize objects when completing self feeding.    Baseline 9/8: grip strength: 7#, lateral pinch 2#, 3 point pinch: 2#    Time 6    Period Weeks    Status On-going      OT SHORT TERM GOAL #5   Title Patient will completing bathing and dressing with Mod assist where needed while utilizing DME, Adaptive equipment, and/or compensatory techniques with improved sitting and standing balance.    Time 6    Period Weeks               OT Long Term Goals - 05/16/21 1415       OT LONG TERM GOAL #1   Title Patient will demonstrate improved LUE A/ROM by 50% or more while utilizing his LUE as his non dominant extremity to provide an active assist to complete tasks that require use of bilateral UE's.    Time 12    Period Weeks    Status On-going      OT LONG TERM GOAL #2   Title Patient will increase his LUE strength (shoulder, elbow, wrist) to 4/5 or better while demonstrating improved ability to complete bathing and dressing tasks.    Time 12    Period Weeks    Status On-going      OT LONG TERM GOAL #3   Title Patient will increase his fine motor coordination in his left hand by completing the 9 hole peg test in the standardized fashion under 1'.    Time 12    Period Weeks    Status On-going      OT LONG TERM GOAL #4   Title Patient will demonstrate improved left hand grip strength to 15# and pinch strength to 8# in order to manipulate and hold onto items in his left hand without dropping.    Time 12    Period Weeks    Status On-going  OT LONG TERM GOAL #5   Title Patient will completing bathing and  dressing with min assist where needed while utilizing DME, Adaptive equipment, and/or compensatory techniques with improved sitting and standing balance.    Status On-going                   Plan - 09/11/21 1412     Clinical Impression Statement A: Difficulty noted when trying to combine elbow flexion with shoulder flexion when reaching towards horizontal bar in order to reach the top of the bar. Occasional rest breaks either standing or sitting were taken as needed. Pt continues to display less movement as he fatigues and requires more hand over hand assist during completion.    Plan P: Continue with NMES unit to focus on AA/ROM of left shoulder. low level reaching.    Consulted and Agree with Plan of Care Patient             Patient will benefit from skilled therapeutic intervention in order to improve the following deficits and impairments:           Visit Diagnosis: Other symptoms and signs involving the musculoskeletal system  Other lack of coordination  Stiffness of left elbow, not elsewhere classified  Stiffness of left hand, not elsewhere classified    Problem List Patient Active Problem List   Diagnosis Date Noted   Urinary frequency    Dysphagia, post-stroke    Spastic hemiplegia affecting nondominant side (HCC)    Drug-induced hypotension    Pressure injury of skin 02/14/2021   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   Dyslipidemia    Hemiparesis affecting left side as late effect of stroke ([place])    Middle cerebral artery embolism, right 02/03/2021   Status post surgery 02/02/2021   Stroke (cerebrum) ([place]) 02/02/2021   Hypoglycemia 01/25/2021   Thyroid nodule 01/25/2021   Hypothyroidism 01/25/2021   Near syncope 01/21/2021   H/O adenomatous polyp of colon 12/30/2020   Acute ischemic stroke ([place]) 11/28/2020   Essential hypertension 11/28/2020   Mixed hyperlipidemia 11/28/2020   Acute CVA (cerebrovascular accident) ([place])  09/09/2020   Transient ischemic attack 09/07/2020   History of hypertension 09/07/2020   Elevated troponin 09/07/2020   Dizziness 09/07/2020   Numbness and tingling 09/07/2020   H/O medication noncompliance 09/07/2020   Medicare annual wellness visit, subsequent 12/05/2019   Nicotine dependence 12/05/2019   Chronic kidney disease, stage 3 unspecified ([place]) 10/24/2019   Prediabetes 10/24/2019   Edema of foot 09/14/2017   Tubular adenoma of colon 09/14/2017   Long term (current) use of anticoagulants 11/14/2014   Sickle cell trait ([place]) 08/29/2012   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 [place], [place], [address] Phone: [phone]   Fax:  [phone]  Name: Matthew Wise MRN: [id number] Date of Birth: 12-10-1960

## 2021-09-11 NOTE — Therapy (Signed)
[facility] [address] [place], [place], [address] Phone: [phone]   Fax:  [phone]  Physical Therapy Treatment  Patient Details  Name: Matthew Wise MRN: [id number] Date of Birth: August 31, 1961 Referring Provider (PT): Alysia Penna MD   Encounter Date: 09/11/2021   PT End of Session - 09/11/21 1350     Visit Number 29    Number of Visits 24    Date for PT Re-Evaluation 10/21/21    Authorization Type [employer]    Progress Note Due on Visit 41    PT Start Time 1349    PT Stop Time 1430    PT Time Calculation (min) 41 min    Equipment Utilized During Treatment Gait belt    Activity Tolerance Patient tolerated treatment well    Behavior During Therapy [facility] for tasks assessed/performed             Past Medical History:  Diagnosis Date   Acute CVA (cerebrovascular accident) ([place]) 09/09/2020   Chronic anticoagulation 11/14/2014   CKD (chronic kidney disease) stage 3, GFR 30-59 ml/min ([place]) 10/24/2019   CKD (chronic kidney disease) stage 3, GFR 30-59 ml/min ([place]) 10/24/2019   Clotting disorder ([place])    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 two simple tubular adenomas removed. patient was advised to go to [facility] for colonoscopy but he did not keep appt.   IR CT HEAD LTD  02/02/2021   IR PERCUTANEOUS ART THROMBECTOMY/INFUSION INTRACRANIAL INC DIAG ANGIO  02/02/2021   RADIOLOGY WITH ANESTHESIA N/A  02/02/2021   Procedure: IR WITH ANESTHESIA;  Surgeon: Luanne Bras, MD;  Location: [facility];  Service: Radiology;  Laterality: N/A;   TEE WITHOUT CARDIOVERSION N/A 09/10/2020   Procedure: TRANSESOPHAGEAL ECHOCARDIOGRAM (TEE) WITH PROPOFOL;  Surgeon: Satira Sark, MD;  Location: [facility];  Service: Cardiovascular;  Laterality: N/A;    There were no vitals filed for this visit.   Subjective Assessment - 09/11/21 1351     Subjective No new issues. Still waiting on brace    Currently in Pain? No/denies                               [facility] Adult PT Treatment/Exercise - 09/11/21 0001       Knee/Hip Exercises: Standing   Gait Training 130 feet using LBQC and WC follow rest break x 1      Knee/Hip Exercises: Seated   Abduction/Adduction  Strengthening;Both;10 reps   clamshell RTB   Sit to Sand 5 reps   with active assist for LT weight shift                      PT Short Term Goals - 09/09/21 1400       PT SHORT TERM GOAL #1   Title Patient will be independent with initial HEP and self-management strategies to improve functional  outcomes    Baseline "walking and standing mainly using quad cane"    Time 4    Period Weeks    Status On-going    Target Date 09/30/21      PT SHORT TERM GOAL #2   Title Patient will be Min A with transfers and bed mobility    Time 4    Period Weeks    Status Achieved    Target Date 06/19/21      PT SHORT TERM GOAL #3   Title Decrease risk for falls per score 45/56 Berg Balance test    Baseline 32/56 indicating high risk for falls    Time 3    Period Weeks    Status New    Target Date 09/30/21               PT Long Term Goals - 09/09/21 1411       PT LONG TERM GOAL #1   Title Patient will be Mod I with bed mobility and transfers    Baseline Mod independent bed mobility; set-up to modified indep. transfers    Time 8    Period Weeks    Status Achieved      PT LONG TERM GOAL #2   Title Patient  will be able to ambulate >100 feet with LRAD for improved household mobility    Baseline Current 115 feet with HW    Time 8    Period Weeks    Status Achieved      PT LONG TERM GOAL #3   Title Patient will have at least 4/5 MMT throughout LLE (except ankle DF) for improved functional mobility and stair/ step navigation.    Baseline See MMT    Time 8    Period Weeks    Status On-going    Target Date 10/21/21                   Plan - 09/11/21 1456     Clinical Impression Statement Patient slightly more limited by fatigue today. Required extra rest break during ambulation with quad cane. Requires ongoing verbal cues for LLE positioning and weight shifting during initial reps of sit to stand, but quickly improves with practice. Added seated band resisted clams at end of session to continue LE strength but accommodate patient fatigue with WB activity this date, Patient will continue to benefit from skilled therapy services to progress activity tolerance and LE strength for improved functional mobility.    Examination-Activity Limitations Caring for Others;Transfers;Locomotion Level;Toileting;Stand    Examination-Participation Restrictions Yard Work;Cleaning;Laundry;Community Activity    Stability/Clinical Decision Making Evolving/Moderate complexity    Rehab Potential Fair    PT Frequency 2x / week    PT Duration 4 weeks    PT Treatment/Interventions ADLs/Self Care Home Management;Aquatic Therapy;Biofeedback;Cryotherapy;Fluidtherapy;Contrast Bath;Electrical Stimulation;Therapeutic exercise;Orthotic Fit/Training;Patient/family education;Manual lymph drainage;Manual techniques;Dry needling;Energy conservation;Spinal Manipulations;Joint Manipulations;Splinting;Taping;Compression bandaging;Vasopneumatic Device;Therapeutic activities;Parrafin;Ultrasound;Traction;Functional mobility training;Neuromuscular re-education;Stair training;Gait training;Moist Heat;Iontophoresis 4mg /ml  Dexamethasone;DME Instruction;Balance training;Passive range of motion;Scar mobilization;Wheelchair mobility training;Visual/perceptual remediation/compensation    PT Next Visit Plan Progress functional mobility as tolerated. Encourage WB through LT side. Gait. F/U about AFO fitting    PT Home Exercise Plan Eval: marches, LAQs, heel/toe raise; 7/1:  bridge and sidelying clam 7/12 heel slide, supine hip abduction; tall sitting with lumbar support 10/13 seated clam    Consulted and Agree with Plan of Care Patient             Patient will benefit from skilled therapeutic intervention in order  to improve the following deficits and impairments:  Abnormal gait, Improper body mechanics, Impaired sensation, Decreased balance, Difficulty walking, Decreased activity tolerance, Decreased range of motion, Decreased strength, Hypomobility, Impaired tone, Decreased mobility, Decreased coordination  Visit Diagnosis: Difficulty in walking, not elsewhere classified  Muscle weakness (generalized)  Other symptoms and signs involving the musculoskeletal system  Other lack of coordination     Problem List Patient Active Problem List   Diagnosis Date Noted   Urinary frequency    Dysphagia, post-stroke    Spastic hemiplegia affecting nondominant side (HCC)    Drug-induced hypotension    Pressure injury of skin 02/14/2021   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   Dyslipidemia    Hemiparesis affecting left side as late effect of stroke ([place])    Middle cerebral artery embolism, right 02/03/2021   Status post surgery 02/02/2021   Stroke (cerebrum) ([place]) 02/02/2021   Hypoglycemia 01/25/2021   Thyroid nodule 01/25/2021   Hypothyroidism 01/25/2021   Near syncope 01/21/2021   H/O adenomatous polyp of colon 12/30/2020   Acute ischemic stroke ([place]) 11/28/2020   Essential hypertension 11/28/2020   Mixed hyperlipidemia 11/28/2020   Acute CVA (cerebrovascular accident) ([place]) 09/09/2020    Transient ischemic attack 09/07/2020   History of hypertension 09/07/2020   Elevated troponin 09/07/2020   Dizziness 09/07/2020   Numbness and tingling 09/07/2020   H/O medication noncompliance 09/07/2020   Medicare annual wellness visit, subsequent 12/05/2019   Nicotine dependence 12/05/2019   Chronic kidney disease, stage 3 unspecified ([place]) 10/24/2019   Prediabetes 10/24/2019   Edema of foot 09/14/2017   Tubular adenoma of colon 09/14/2017   Long term (current) use of anticoagulants 11/14/2014   Sickle cell trait ([place]) 08/29/2012   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

## 2021-09-12 DIAGNOSIS — L8990 Pressure ulcer of unspecified site, unspecified stage: Secondary | ICD-10-CM | POA: Diagnosis not present

## 2021-09-12 DIAGNOSIS — R350 Frequency of micturition: Secondary | ICD-10-CM | POA: Diagnosis not present

## 2021-09-12 DIAGNOSIS — I69391 Dysphagia following cerebral infarction: Secondary | ICD-10-CM | POA: Diagnosis not present

## 2021-09-12 DIAGNOSIS — I63511 Cerebral infarction due to unspecified occlusion or stenosis of right middle cerebral artery: Secondary | ICD-10-CM | POA: Diagnosis not present

## 2021-09-12 DIAGNOSIS — I952 Hypotension due to drugs: Secondary | ICD-10-CM | POA: Diagnosis not present

## 2021-09-16 ENCOUNTER — Encounter (HOSPITAL_COMMUNITY): Payer: Self-pay

## 2021-09-16 ENCOUNTER — Ambulatory Visit (HOSPITAL_COMMUNITY): Payer: Medicare Other

## 2021-09-16 ENCOUNTER — Other Ambulatory Visit: Payer: Self-pay

## 2021-09-16 DIAGNOSIS — R278 Other lack of coordination: Secondary | ICD-10-CM

## 2021-09-16 DIAGNOSIS — R262 Difficulty in walking, not elsewhere classified: Secondary | ICD-10-CM

## 2021-09-16 DIAGNOSIS — R2689 Other abnormalities of gait and mobility: Secondary | ICD-10-CM

## 2021-09-16 DIAGNOSIS — M6281 Muscle weakness (generalized): Secondary | ICD-10-CM | POA: Diagnosis not present

## 2021-09-16 DIAGNOSIS — R29898 Other symptoms and signs involving the musculoskeletal system: Secondary | ICD-10-CM

## 2021-09-16 NOTE — Therapy (Signed)
[facility] [address] [place], [place], [address] Phone: [phone]   Fax:  [phone]  Physical Therapy Treatment  Patient Details  Name: Matthew Wise MRN: [id number] Date of Birth: 09/05/1961 Referring Provider (PT): Alysia Penna MD   Encounter Date: 09/16/2021   PT End of Session - 09/16/21 1343     Visit Number 30    Number of Visits 50    Date for PT Re-Evaluation 10/21/21    Authorization Type [employer]    Progress Note Due on Visit 48    PT Start Time 1345    PT Stop Time 1430    PT Time Calculation (min) 45 min    Equipment Utilized During Treatment Gait belt    Activity Tolerance Patient tolerated treatment well    Behavior During Therapy [facility] for tasks assessed/performed             Past Medical History:  Diagnosis Date   Acute CVA (cerebrovascular accident) ([place]) 09/09/2020   Chronic anticoagulation 11/14/2014   CKD (chronic kidney disease) stage 3, GFR 30-59 ml/min ([place]) 10/24/2019   CKD (chronic kidney disease) stage 3, GFR 30-59 ml/min ([place]) 10/24/2019   Clotting disorder ([place])    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 two simple tubular adenomas removed. patient was advised to go to [facility] but he did not keep appt.   IR CT HEAD LTD  02/02/2021   IR PERCUTANEOUS ART THROMBECTOMY/INFUSION INTRACRANIAL INC DIAG ANGIO  02/02/2021   RADIOLOGY WITH ANESTHESIA N/A  02/02/2021   Procedure: IR WITH ANESTHESIA;  Surgeon: Luanne Bras, MD;  Location: [facility];  Service: Radiology;  Laterality: N/A;   TEE WITHOUT CARDIOVERSION N/A 09/10/2020   Procedure: TRANSESOPHAGEAL ECHOCARDIOGRAM (TEE) WITH PROPOFOL;  Surgeon: Satira Sark, MD;  Location: [facility];  Service: Cardiovascular;  Laterality: N/A;    There were no vitals filed for this visit.   Subjective Assessment - 09/16/21 1350     Subjective Still awaiting call from orthotist for follow-up    Currently in Pain? No/denies                [facility] PT Assessment - 09/16/21 1351       Assessment   Medical Diagnosis S/P right MCA with left hemiparesis                           [facility] Adult PT Treatment/Exercise - 09/16/21 0001       Ambulation/Gait   Ambulation/Gait Yes    Ambulation/Gait Assistance 4: Min guard    Ambulation Distance (Feet) 50 Feet    Assistive device Small based quad cane    Gait Pattern Step-to pattern    Pre-Gait Activities Performing offset standing with 6" step RLE elevated and therapist facilitating LLE extension for midstance stability with 4x30 sec static holds and 3x10 repetitions for limb advancement requiring mod A for  LLE clearance on/off step    Gait Comments tactile cues to left shoulder and lumbar for extension in mid-stance      Neuro Re-ed    Neuro Re-ed Details  static standing with very wide BOS and performing forward trunk flexion to rest hands on target followed by extension to improve balance over BOS and improve postural control. Sit-ups from EOM with wedge behind for ROM assist and therapist stabilizing legs to improve trunk flexion strength 5x5 reps                       PT Short Term Goals - 09/09/21 1400       PT SHORT TERM GOAL #1   Title Patient will be independent with initial HEP and self-management strategies to improve functional outcomes    Baseline "walking and standing mainly using quad cane"    Time  4    Period Weeks    Status On-going    Target Date 09/30/21      PT SHORT TERM GOAL #2   Title Patient will be Min A with transfers and bed mobility    Time 4    Period Weeks    Status Achieved    Target Date 06/19/21      PT SHORT TERM GOAL #3   Title Decrease risk for falls per score 45/56 Berg Balance test    Baseline 32/56 indicating high risk for falls    Time 3    Period Weeks    Status New    Target Date 09/30/21               PT Long Term Goals - 09/09/21 1411       PT LONG TERM GOAL #1   Title Patient will be Mod I with bed mobility and transfers    Baseline Mod independent bed mobility; set-up to modified indep. transfers    Time 8    Period Weeks    Status Achieved      PT LONG TERM GOAL #2   Title Patient will be able to ambulate >100 feet with LRAD for improved household mobility    Baseline Current 115 feet with HW    Time 8    Period Weeks    Status Achieved      PT LONG TERM GOAL #3   Title Patient will have at least 4/5 MMT throughout LLE (except ankle DF) for improved functional mobility and stair/ step navigation.    Baseline See MMT    Time 8    Period Weeks    Status On-going    Target Date 10/21/21                   Plan - 09/16/21 1438     Clinical Impression Statement Difficulty with gross, patterned movements LLE and demonstrates generalized weaknesss but improving in his weight shifting and limb advancement in gait. Would benefit from AFO intervention to address left foot drop and weakness. Continued sessions to improve trunk strength, dynamic balance, and improve ambulation pattern to reduce risk fo rfalls    Examination-Activity Limitations Caring for Others;Transfers;Locomotion Level;Toileting;Stand    Examination-Participation Restrictions Yard Work;Cleaning;Laundry;Community Activity    Stability/Clinical Decision Making Evolving/Moderate complexity    Rehab Potential Fair    PT Frequency 2x / week    PT Duration 4  weeks    PT Treatment/Interventions ADLs/Self Care Home Management;Aquatic Therapy;Biofeedback;Cryotherapy;Fluidtherapy;Contrast Bath;Electrical Stimulation;Therapeutic exercise;Orthotic Fit/Training;Patient/family education;Manual lymph drainage;Manual techniques;Dry needling;Energy conservation;Spinal  Manipulations;Joint Manipulations;Splinting;Taping;Compression bandaging;Vasopneumatic Device;Therapeutic activities;Parrafin;Ultrasound;Traction;Functional mobility training;Neuromuscular re-education;Stair training;Gait training;Moist Heat;Iontophoresis 4mg /ml Dexamethasone;DME Instruction;Balance training;Passive range of motion;Scar mobilization;Wheelchair mobility training;Visual/perceptual remediation/compensation    PT Next Visit Plan Progress functional mobility as tolerated. Encourage WB through LT side. Gait. F/U about AFO fitting    PT Home Exercise Plan Eval: marches, LAQs, heel/toe raise; 7/1:  bridge and sidelying clam 7/12 heel slide, supine hip abduction; tall sitting with lumbar support 10/13 seated clam    Consulted and Agree with Plan of Care Patient             Patient will benefit from skilled therapeutic intervention in order to improve the following deficits and impairments:  Abnormal gait, Improper body mechanics, Impaired sensation, Decreased balance, Difficulty walking, Decreased activity tolerance, Decreased range of motion, Decreased strength, Hypomobility, Impaired tone, Decreased mobility, Decreased coordination  Visit Diagnosis: Other symptoms and signs involving the musculoskeletal system  Other lack of coordination  Difficulty in walking, not elsewhere classified  Muscle weakness (generalized)  Other abnormalities of gait and mobility     Problem List Patient Active Problem List   Diagnosis Date Noted   Urinary frequency    Dysphagia, post-stroke    Spastic hemiplegia affecting nondominant side (HCC)    Drug-induced hypotension    Pressure injury of  skin 02/14/2021   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   Dyslipidemia    Hemiparesis affecting left side as late effect of stroke ([place])    Middle cerebral artery embolism, right 02/03/2021   Status post surgery 02/02/2021   Stroke (cerebrum) ([place]) 02/02/2021   Hypoglycemia 01/25/2021   Thyroid nodule 01/25/2021   Hypothyroidism 01/25/2021   Near syncope 01/21/2021   H/O adenomatous polyp of colon 12/30/2020   Acute ischemic stroke ([place]) 11/28/2020   Essential hypertension 11/28/2020   Mixed hyperlipidemia 11/28/2020   Acute CVA (cerebrovascular accident) ([place]) 09/09/2020   Transient ischemic attack 09/07/2020   History of hypertension 09/07/2020   Elevated troponin 09/07/2020   Dizziness 09/07/2020   Numbness and tingling 09/07/2020   H/O medication noncompliance 09/07/2020   Medicare annual wellness visit, subsequent 12/05/2019   Nicotine dependence 12/05/2019   Chronic kidney disease, stage 3 unspecified ([place]) 10/24/2019   Prediabetes 10/24/2019   Edema of foot 09/14/2017   Tubular adenoma of colon 09/14/2017   Long term (current) use of anticoagulants 11/14/2014   Sickle cell trait ([place]) 08/29/2012   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

## 2021-09-17 NOTE — Therapy (Signed)
[place], [place], [address] Phone: [phone]   Fax:  [phone]  Occupational Therapy Treatment  Patient Details  Name: Matthew Wise MRN: [id number] Date of Birth: 1961/07/21 Referring Provider (OT): Alysia Penna, MD   Encounter Date: 09/16/2021   OT End of Session - 09/17/21 0815     Visit Number 30    Number of Visits 34    Date for OT Re-Evaluation 09/18/21    Authorization Type UHC medicare    Authorization Time Period 11/30/20-end of year. $30 copay. 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 two simple tubular adenomas removed. patient was advised to go to [facility] for colonoscopy but he did not keep appt.   IR CT HEAD LTD  02/02/2021   IR PERCUTANEOUS ART THROMBECTOMY/INFUSION INTRACRANIAL INC DIAG ANGIO  02/02/2021    RADIOLOGY WITH ANESTHESIA N/A 02/02/2021   Procedure: IR WITH ANESTHESIA;  Surgeon: Luanne Bras, MD;  Location: [facility];  Service: Radiology;  Laterality: N/A;   TEE WITHOUT CARDIOVERSION N/A 09/10/2020   Procedure: TRANSESOPHAGEAL ECHOCARDIOGRAM (TEE) WITH PROPOFOL;  Surgeon: Satira Sark, MD;  Location: [facility];  Service: Cardiovascular;  Laterality: N/A;    There were no vitals filed for this visit.   Subjective Assessment - 09/16/21 1314     Subjective  S: Nothing new to report.    Currently in Pain? No/denies                [facility] OT Assessment - 09/16/21 1316       Assessment   Medical Diagnosis S/P right MCA with left hemiparesis      Precautions   Precautions Fall    Precaution Comments left side hemiparesis, left side visual field deficits                      OT Treatments/Exercises (OP) - 09/16/21 1316       Exercises   Exercises Shoulder      Functional Reaching Activities   Low Level Completed low level reaching while standing using ring tree. yellow and red level used. Worked on active movement with occassional active assist needed to place hand on top of bar or through ring and slide  off pole.    Mid Level mid level reaching completed standing with ring tree. Used Saebo balls on large rings. Focused on grasp to hold onto ring in order to place ring on red level. Complete 3 times.                      OT Short Term Goals - 08/26/21 1543       OT SHORT TERM GOAL #1   Title Patient will be educated and independent with HEP completion in order to faciliate his progress in therapy and begin to use his LUE as a gross assist for 25% or more of daily tasks.    Time 6    Period Weeks    Target Date 06/25/21      OT SHORT TERM GOAL #2   Title Patient will demonstrate a 25% increase in A/ROM of the LUE (shoulder, elbow, wrist) as able while using his LUE as a gross assist when completing self care tasks such as dressing.     Time 6    Period Weeks      OT SHORT TERM GOAL #3   Title Patient will demonstrate increased fine motor coordination in his left hand by completing the 9 hole peg test with peg set-up if needed.    Baseline 9/8: Unable to complete even with ste up provided.    Time 6    Period Weeks    Status On-going      OT SHORT TERM GOAL #4   Title Patient will increase is left hand strength to 10# and his pinch strength to 5# in order to begin to use his left hand to stabilize objects when completing self feeding.    Baseline 9/8: grip strength: 7#, lateral pinch 2#, 3 point pinch: 2#    Time 6    Period Weeks    Status On-going      OT SHORT TERM GOAL #5   Title Patient will completing bathing and dressing with Mod assist where needed while utilizing DME, Adaptive equipment, and/or compensatory techniques with improved sitting and standing balance.    Time 6    Period Weeks               OT Long Term Goals - 05/16/21 1415       OT LONG TERM GOAL #1   Title Patient will demonstrate improved LUE A/ROM by 50% or more while utilizing his LUE as his non dominant extremity to provide an active assist to complete tasks that require use of bilateral UE's.    Time 12    Period Weeks    Status On-going      OT LONG TERM GOAL #2   Title Patient will increase his LUE strength (shoulder, elbow, wrist) to 4/5 or better while demonstrating improved ability to complete bathing and dressing tasks.    Time 12    Period Weeks    Status On-going      OT LONG TERM GOAL #3   Title Patient will increase his fine motor coordination in his left hand by completing the 9 hole peg test in the standardized fashion under 1'.    Time 12    Period Weeks    Status On-going      OT LONG TERM GOAL #4   Title Patient will demonstrate improved left hand grip strength to 15# and pinch strength to 8# in order to manipulate and hold onto items in his left hand without  dropping.    Time 12    Period Weeks     Status On-going      OT LONG TERM GOAL #5   Title Patient will completing bathing and dressing with min assist where needed while utilizing DME, Adaptive equipment, and/or compensatory techniques with improved sitting and standing balance.    Status On-going                   Plan - 09/17/21 0815     Clinical Impression Statement A: Session focused on standing to completed functional low to mid level reaching. Remained below shoulder height. Was able to completed all activities with increased time and seated rest breaks occassionally. Provided intermittent active assist for proper arm positioning if needed. Worked on reaching and grasping with left UE/hand with foream pronated, supinated, as well as in nuetral. Pt was provided with a piece of yellow Dycem for under wheelchair cushion to prevent cushion from sliding back.    Body Structure / Function / Physical Skills ADL;Decreased knowledge of use of DME;Strength;Tone;GMC;Balance;UE functional use;Vision;ROM;IADL;Sensation;Coordination;FMC    Plan P: Reassessment    Consulted and Agree with Plan of Care Patient             Patient will benefit from skilled therapeutic intervention in order to improve the following deficits and impairments:   Body Structure / Function / Physical Skills: ADL, Decreased knowledge of use of DME, Strength, Tone, GMC, Balance, UE functional use, Vision, ROM, IADL, Sensation, Coordination, [facility]       Visit Diagnosis: Other symptoms and signs involving the musculoskeletal system  Other lack of coordination    Problem List Patient Active Problem List   Diagnosis Date Noted   Urinary frequency    Dysphagia, post-stroke    Spastic hemiplegia affecting nondominant side (HCC)    Drug-induced hypotension    Pressure injury of skin 02/14/2021   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   Dyslipidemia    Hemiparesis affecting left side as late effect of stroke ([place])    Middle cerebral  artery embolism, right 02/03/2021   Status post surgery 02/02/2021   Stroke (cerebrum) ([place]) 02/02/2021   Hypoglycemia 01/25/2021   Thyroid nodule 01/25/2021   Hypothyroidism 01/25/2021   Near syncope 01/21/2021   H/O adenomatous polyp of colon 12/30/2020   Acute ischemic stroke ([place]) 11/28/2020   Essential hypertension 11/28/2020   Mixed hyperlipidemia 11/28/2020   Acute CVA (cerebrovascular accident) ([place]) 09/09/2020   Transient ischemic attack 09/07/2020   History of hypertension 09/07/2020   Elevated troponin 09/07/2020   Dizziness 09/07/2020   Numbness and tingling 09/07/2020   H/O medication noncompliance 09/07/2020   Medicare annual wellness visit, subsequent 12/05/2019   Nicotine dependence 12/05/2019   Chronic kidney disease, stage 3 unspecified ([place]) 10/24/2019   Prediabetes 10/24/2019   Edema of foot 09/14/2017   Tubular adenoma of colon 09/14/2017   Long term (current) use of anticoagulants 11/14/2014   Sickle cell trait ([place]) 08/29/2012   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

## 2021-09-18 ENCOUNTER — Encounter (HOSPITAL_COMMUNITY): Payer: Self-pay

## 2021-09-18 ENCOUNTER — Ambulatory Visit (HOSPITAL_COMMUNITY): Payer: Medicare Other | Admitting: Physical Therapy

## 2021-09-18 ENCOUNTER — Encounter (HOSPITAL_COMMUNITY): Payer: Self-pay | Admitting: Physical Therapy

## 2021-09-18 ENCOUNTER — Other Ambulatory Visit: Payer: Self-pay

## 2021-09-18 ENCOUNTER — Ambulatory Visit (HOSPITAL_COMMUNITY): Payer: Medicare Other

## 2021-09-18 DIAGNOSIS — R29898 Other symptoms and signs involving the musculoskeletal system: Secondary | ICD-10-CM

## 2021-09-18 DIAGNOSIS — M6281 Muscle weakness (generalized): Secondary | ICD-10-CM | POA: Diagnosis not present

## 2021-09-18 DIAGNOSIS — R262 Difficulty in walking, not elsewhere classified: Secondary | ICD-10-CM

## 2021-09-18 DIAGNOSIS — R278 Other lack of coordination: Secondary | ICD-10-CM

## 2021-09-18 DIAGNOSIS — R2689 Other abnormalities of gait and mobility: Secondary | ICD-10-CM

## 2021-09-18 DIAGNOSIS — M25642 Stiffness of left hand, not elsewhere classified: Secondary | ICD-10-CM

## 2021-09-18 DIAGNOSIS — M25622 Stiffness of left elbow, not elsewhere classified: Secondary | ICD-10-CM

## 2021-09-18 NOTE — Therapy (Signed)
[facility] [address] [place], [place], [address] Phone: [phone]   Fax:  [phone]  Physical Therapy Treatment  Patient Details  Name: Matthew Wise MRN: [id number] Date of Birth: 02-03-1961 Referring Provider (PT): Alysia Penna MD   Encounter Date: 09/18/2021   PT End of Session - 09/18/21 1349     Visit Number 31    Number of Visits 55    Date for PT Re-Evaluation 10/21/21    Authorization Type U[employer] MEdicare    Progress Note Due on Visit 41    PT Start Time 1347    PT Stop Time 1427    PT Time Calculation (min) 40 min    Equipment Utilized During Treatment Gait belt    Activity Tolerance Patient tolerated treatment well    Behavior During Therapy [facility] for tasks assessed/performed             Past Medical History:  Diagnosis Date   Acute CVA (cerebrovascular accident) ([place]) 09/09/2020   Chronic anticoagulation 11/14/2014   CKD (chronic kidney disease) stage 3, GFR 30-59 ml/min ([place]) 10/24/2019   CKD (chronic kidney disease) stage 3, GFR 30-59 ml/min ([place]) 10/24/2019   Clotting disorder ([place])    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 two simple tubular adenomas removed. patient was advised to go to [facility] for colonoscopy but he did not keep appt.   IR CT HEAD LTD  02/02/2021   IR PERCUTANEOUS ART THROMBECTOMY/INFUSION INTRACRANIAL INC DIAG ANGIO  02/02/2021   RADIOLOGY WITH ANESTHESIA N/A  02/02/2021   Procedure: IR WITH ANESTHESIA;  Surgeon: Luanne Bras, MD;  Location: [facility];  Service: Radiology;  Laterality: N/A;   TEE WITHOUT CARDIOVERSION N/A 09/10/2020   Procedure: TRANSESOPHAGEAL ECHOCARDIOGRAM (TEE) WITH PROPOFOL;  Surgeon: Satira Sark, MD;  Location: [facility];  Service: Cardiovascular;  Laterality: N/A;    There were no vitals filed for this visit.   Subjective Assessment - 09/18/21 1349     Subjective No new reports    Pertinent History CVA 02/02/21    Limitations Walking;Standing;House hold activities    Currently in Pain? No/denies                               [facility] Adult PT Treatment/Exercise - 09/18/21 0001       Knee/Hip Exercises: Standing   Gait Training 130 feet using LBQC and WC follow      Knee/Hip Exercises: Seated   Other Seated Knee/Hip Exercises 4 inch step taps HHA x 1   Mod verbal and tactile cues   Sit to [employer] --      Modalities   Modalities Teacher, English as a foreign language Location LT tibialis anterior    Electrical Stimulation Action Russian Stim    Electrical Stimulation Parameters 10/10 sec, 50% duty, 37mA    Electrical  Stimulation Goals Neuromuscular facilitation   with attempted active ankle DF bilateral with mirror feedback                      PT Short Term Goals - 09/09/21 1400       PT SHORT TERM GOAL #1   Title Patient will be independent with initial HEP and self-management strategies to improve functional outcomes    Baseline "walking and standing mainly using quad cane"    Time 4    Period Weeks    Status On-going    Target Date 09/30/21      PT SHORT TERM GOAL #2   Title Patient will be Min A with transfers and bed mobility    Time 4    Period Weeks    Status Achieved    Target Date 06/19/21      PT SHORT TERM GOAL #3   Title Decrease risk for falls per score 45/56 Berg Balance test    Baseline 32/56 indicating high risk  for falls    Time 3    Period Weeks    Status New    Target Date 09/30/21               PT Long Term Goals - 09/09/21 1411       PT LONG TERM GOAL #1   Title Patient will be Mod I with bed mobility and transfers    Baseline Mod independent bed mobility; set-up to modified indep. transfers    Time 8    Period Weeks    Status Achieved      PT LONG TERM GOAL #2   Title Patient will be able to ambulate >100 feet with LRAD for improved household mobility    Baseline Current 115 feet with HW    Time 8    Period Weeks    Status Achieved      PT LONG TERM GOAL #3   Title Patient will have at least 4/5 MMT throughout LLE (except ankle DF) for improved functional mobility and stair/ step navigation.    Baseline See MMT    Time 8    Period Weeks    Status On-going    Target Date 10/21/21                   Plan - 09/18/21 1417     Clinical Impression Statement Patient showing good ambulation with large quad cane and developed compensation strategy. Does require ongoing verbal cues to avoid LLE external rotation an occasional positional awareness to avoid running into cabinets. Trialed NMES to LT ankle DFs. Limited success noted. Some fasciculation and toe extension but minimal DF. Also performed bilaterally and with mirror feedback. Patient does require frequent cues for attention to task and actively attempting with both feet. Will continue to benefit form skilled therapy services to progress compensation strategy for improved gait and increased functional independence.    Examination-Activity Limitations Caring for Others;Transfers;Locomotion Level;Toileting;Stand    Examination-Participation Restrictions Yard Work;Cleaning;Laundry;Community Activity    Stability/Clinical Decision Making Evolving/Moderate complexity    Rehab Potential Fair    PT Frequency 2x / week    PT Duration 4 weeks    PT Treatment/Interventions ADLs/Self Care Home Management;Aquatic  Therapy;Biofeedback;Cryotherapy;Fluidtherapy;Contrast Bath;Electrical Stimulation;Therapeutic exercise;Orthotic Fit/Training;Patient/family education;Manual lymph drainage;Manual techniques;Dry needling;Energy conservation;Spinal Manipulations;Joint Manipulations;Splinting;Taping;Compression bandaging;Vasopneumatic Device;Therapeutic activities;Parrafin;Ultrasound;Traction;Functional mobility training;Neuromuscular re-education;Stair training;Gait training;Moist Heat;Iontophoresis 4mg /ml Dexamethasone;DME Instruction;Balance training;Passive range of motion;Scar mobilization;Wheelchair mobility training;Visual/perceptual remediation/compensation    PT Next Visit Plan  Progress functional mobility as tolerated. Encourage WB through LT side. Gait. F/U about AFO fitting    PT Home Exercise Plan Eval: marches, LAQs, heel/toe raise; 7/1:  bridge and sidelying clam 7/12 heel slide, supine hip abduction; tall sitting with lumbar support 10/13 seated clam    Consulted and Agree with Plan of Care Patient             Patient will benefit from skilled therapeutic intervention in order to improve the following deficits and impairments:  Abnormal gait, Improper body mechanics, Impaired sensation, Decreased balance, Difficulty walking, Decreased activity tolerance, Decreased range of motion, Decreased strength, Hypomobility, Impaired tone, Decreased mobility, Decreased coordination  Visit Diagnosis: Other symptoms and signs involving the musculoskeletal system  Other lack of coordination  Difficulty in walking, not elsewhere classified  Muscle weakness (generalized)  Other abnormalities of gait and mobility     Problem List Patient Active Problem List   Diagnosis Date Noted   Urinary frequency    Dysphagia, post-stroke    Spastic hemiplegia affecting nondominant side (HCC)    Drug-induced hypotension    Pressure injury of skin 02/14/2021   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    Dyslipidemia    Hemiparesis affecting left side as late effect of stroke ([place])    Middle cerebral artery embolism, right 02/03/2021   Status post surgery 02/02/2021   Stroke (cerebrum) ([place]) 02/02/2021   Hypoglycemia 01/25/2021   Thyroid nodule 01/25/2021   Hypothyroidism 01/25/2021   Near syncope 01/21/2021   H/O adenomatous polyp of colon 12/30/2020   Acute ischemic stroke ([place]) 11/28/2020   Essential hypertension 11/28/2020   Mixed hyperlipidemia 11/28/2020   Acute CVA (cerebrovascular accident) ([place]) 09/09/2020   Transient ischemic attack 09/07/2020   History of hypertension 09/07/2020   Elevated troponin 09/07/2020   Dizziness 09/07/2020   Numbness and tingling 09/07/2020   H/O medication noncompliance 09/07/2020   Medicare annual wellness visit, subsequent 12/05/2019   Nicotine dependence 12/05/2019   Chronic kidney disease, stage 3 unspecified ([place]) 10/24/2019   Prediabetes 10/24/2019   Edema of foot 09/14/2017   Tubular adenoma of colon 09/14/2017   Long term (current) use of anticoagulants 11/14/2014   Sickle cell trait ([place]) 08/29/2012   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

## 2021-09-18 NOTE — Therapy (Signed)
[facility] [address] [place], [place], [address] Phone: [phone]   Fax:  [phone]  Occupational Therapy Treatment reassessment Patient Details  Name: Matthew Wise MRN: [id number] Date of Birth: [birth year] Referring Provider (OT): Alysia Penna, MD   Encounter Date: 09/18/2021   OT End of Session - 09/18/21 2234     Visit Number 31    Number of Visits 34    Date for OT Re-Evaluation 09/18/21    Authorization Type UHC medicare    Authorization Time Period 11/30/20-end of year. $30 copay. 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 two simple tubular adenomas removed. patient was advised to go to [facility] for colonoscopy but he did not keep appt.   IR CT HEAD LTD  02/02/2021   IR PERCUTANEOUS ART THROMBECTOMY/INFUSION INTRACRANIAL INC  DIAG ANGIO  02/02/2021   RADIOLOGY WITH ANESTHESIA N/A 02/02/2021   Procedure: IR WITH ANESTHESIA;  Surgeon: Luanne Bras, MD;  Location: [facility];  Service: Radiology;  Laterality: N/A;   TEE WITHOUT CARDIOVERSION N/A 09/10/2020   Procedure: TRANSESOPHAGEAL ECHOCARDIOGRAM (TEE) WITH PROPOFOL;  Surgeon: Satira Sark, MD;  Location: [facility];  Service: Cardiovascular;  Laterality: N/A;    There were no vitals filed for this visit.   Subjective Assessment - 09/18/21 2234     Subjective  S: I see the Doctor again in November.    Currently in Pain? No/denies                [facility] OT Assessment - 09/18/21 1309       Assessment   Medical Diagnosis S/P right MCA with left hemiparesis    Referring Provider (OT) Alysia Penna, MD      Precautions   Precautions Fall    Precaution Comments left side hemiparesis, left side visual field deficits      Prior Function   Level of Independence Independent      ROM / Strength   AROM / PROM / Strength Strength;AROM      AROM   Overall AROM Comments Assessed seated. IR/er adducted.    AROM Assessment Site Shoulder    Right/Left Shoulder Left    Left Shoulder Flexion 56 Degrees   previous: 45  Left Shoulder ABduction 45 Degrees   previous: same   Left Shoulder External Rotation -9 Degrees   previous: same   Right/Left Elbow Left    Left Elbow Flexion 130   previous: 126   Left Elbow Extension -26   -10   Right/Left Forearm Left    Left Forearm Supination 44 Degrees   previous: 62   Right/Left Wrist Left    Left Wrist Extension 22 Degrees   previous: same   Left Wrist Flexion 36 Degrees   previous: 30   Left Composite Finger Extension 75%   previous: 50%   Left Composite Finger Flexion 75%   previous: same   Left Thumb Opposition Digit 3   previous: same     Strength   Left Hand Grip (lbs) 7   previous: same   Left Hand Lateral Pinch 3 lbs   previous: 2   Left Hand 3 Point Pinch 3 lbs   previous: 2                                OT Short Term Goals - 08/26/21 1543       OT SHORT TERM GOAL #1   Title Patient will be educated and independent with HEP completion in order to faciliate his progress in therapy and begin to use his LUE as a gross assist for 25% or more of daily tasks.    Time 6    Period Weeks    Target Date 06/25/21      OT SHORT TERM GOAL #2   Title Patient will demonstrate a 25% increase in A/ROM of the LUE (shoulder, elbow, wrist) as able while using his LUE as a gross assist when completing self care tasks such as dressing.    Time 6    Period Weeks      OT SHORT TERM GOAL #3   Title Patient will demonstrate increased fine motor coordination in his left hand by completing the 9 hole peg test with peg set-up if needed.    Baseline 9/8: Unable to complete even with ste up provided.    Time 6    Period Weeks    Status On-going      OT SHORT TERM GOAL #4   Title Patient will increase is left hand strength to 10# and his pinch strength to 5# in order to begin to use his left hand to stabilize objects when completing self feeding.    Baseline 9/8: grip strength: 7#, lateral pinch 2#, 3 point pinch: 2#    Time 6    Period Weeks    Status On-going      OT SHORT TERM GOAL #5   Title Patient will completing bathing and dressing with Mod assist where needed while utilizing DME, Adaptive equipment, and/or compensatory techniques with improved sitting and standing balance.    Time 6    Period Weeks               OT Long Term Goals - 05/16/21 1415       OT LONG TERM GOAL #1   Title Patient will demonstrate improved LUE A/ROM by 50% or more while utilizing his LUE as his non dominant extremity to provide an active assist to complete tasks that require use of bilateral UE's.    Time 12    Period Weeks    Status On-going      OT LONG TERM GOAL #  2   Title Patient will increase his LUE strength (shoulder, elbow, wrist) to 4/5 or better while  demonstrating improved ability to complete bathing and dressing tasks.    Time 12    Period Weeks    Status On-going      OT LONG TERM GOAL #3   Title Patient will increase his fine motor coordination in his left hand by completing the 9 hole peg test in the standardized fashion under 1'.    Time 12    Period Weeks    Status On-going      OT LONG TERM GOAL #4   Title Patient will demonstrate improved left hand grip strength to 15# and pinch strength to 8# in order to manipulate and hold onto items in his left hand without dropping.    Time 12    Period Weeks    Status On-going      OT LONG TERM GOAL #5   Title Patient will completing bathing and dressing with min assist where needed while utilizing DME, Adaptive equipment, and/or compensatory techniques with improved sitting and standing balance.    Status On-going                   Plan - 09/18/21 2235     Clinical Impression Statement A: Reassessment was completed this date. Patient has made minimal progress in the past four weeks. Overall composite finger extension has increased to 75% from 50% Even with this improvement, patient is unable to demonstrate finger extension consistently during sessions. A/ROM wrist flexion, shoulder flexion, and elbow flexion have improved by 6-11 degrees. He has lost range of motion in elbow extension and supination. Hand strength had remained at 7lbs. Both 3 point and lateral pinch have increased by 2lbs. Increased flexor tone is prevalent in the LUE and is prevently further progress in therapy. Pt has mentioned that he is interested in Botox injections and will let Dr. Letta Pate know at his next follow up appointment. At this time, it's recommended that OT be held until follow up and return after Botox injections to further progress LUE. Pt is in agreement.    Body Structure / Function / Physical Skills ADL;Decreased knowledge of use of DME;Strength;Tone;GMC;Balance;UE functional  use;Vision;ROM;IADL;Sensation;Coordination;FMC    Plan P: Hold OT until follow up appointment with Dr. Letta Pate. If plan is to have Botox injections for the LUE resume post injection.    Consulted and Agree with Plan of Care Patient             Patient will benefit from skilled therapeutic intervention in order to improve the following deficits and impairments:   Body Structure / Function / Physical Skills: ADL, Decreased knowledge of use of DME, Strength, Tone, GMC, Balance, UE functional use, Vision, ROM, IADL, Sensation, Coordination, [place]       Visit Diagnosis: Other symptoms and signs involving the musculoskeletal system  Other lack of coordination  Stiffness of left hand, not elsewhere classified  Stiffness of left elbow, not elsewhere classified    Problem List Patient Active Problem List   Diagnosis Date Noted   Urinary frequency    Dysphagia, post-stroke    Spastic hemiplegia affecting nondominant side (HCC)    Drug-induced hypotension    Pressure injury of skin 02/14/2021   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   Dyslipidemia    Hemiparesis affecting left side as late effect of stroke ([place])    Middle cerebral artery embolism, right 02/03/2021   Status post  surgery 02/02/2021   Stroke (cerebrum) ([place]) 02/02/2021   Hypoglycemia 01/25/2021   Thyroid nodule 01/25/2021   Hypothyroidism 01/25/2021   Near syncope 01/21/2021   H/O adenomatous polyp of colon 12/30/2020   Acute ischemic stroke ([place]) 11/28/2020   Essential hypertension 11/28/2020   Mixed hyperlipidemia 11/28/2020   Acute CVA (cerebrovascular accident) ([place]) 09/09/2020   Transient ischemic attack 09/07/2020   History of hypertension 09/07/2020   Elevated troponin 09/07/2020   Dizziness 09/07/2020   Numbness and tingling 09/07/2020   H/O medication noncompliance 09/07/2020   Medicare annual wellness visit, subsequent 12/05/2019   Nicotine dependence 12/05/2019   Chronic kidney  disease, stage 3 unspecified ([place]) 10/24/2019   Prediabetes 10/24/2019   Edema of foot 09/14/2017   Tubular adenoma of colon 09/14/2017   Long term (current) use of anticoagulants 11/14/2014   Sickle cell trait ([place]) 08/29/2012   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

## 2021-09-23 ENCOUNTER — Ambulatory Visit (HOSPITAL_COMMUNITY): Payer: Medicare Other

## 2021-09-23 ENCOUNTER — Other Ambulatory Visit: Payer: Self-pay

## 2021-09-23 ENCOUNTER — Encounter (HOSPITAL_COMMUNITY): Payer: Medicare Other

## 2021-09-23 DIAGNOSIS — M6281 Muscle weakness (generalized): Secondary | ICD-10-CM

## 2021-09-23 DIAGNOSIS — R29898 Other symptoms and signs involving the musculoskeletal system: Secondary | ICD-10-CM

## 2021-09-23 DIAGNOSIS — R2689 Other abnormalities of gait and mobility: Secondary | ICD-10-CM

## 2021-09-23 DIAGNOSIS — R262 Difficulty in walking, not elsewhere classified: Secondary | ICD-10-CM

## 2021-09-23 DIAGNOSIS — R278 Other lack of coordination: Secondary | ICD-10-CM

## 2021-09-23 NOTE — Therapy (Signed)
[facility] [address] [place], [place], [address] Phone: [phone]   Fax:  [phone]  Physical Therapy Treatment  Patient Details  Name: Matthew Wise MRN: [id number] Date of Birth: [birth year] Referring Provider (PT): Alysia Penna MD   Encounter Date: 09/23/2021   PT End of Session - 09/23/21 1258     Visit Number 32    Number of Visits 43    Date for PT Re-Evaluation 10/21/21    Authorization Type [employer] MEdicare    Progress Note Due on Visit 41    PT Start Time 1300    PT Stop Time 1345    PT Time Calculation (min) 45 min    Equipment Utilized During Treatment Gait belt    Activity Tolerance Patient tolerated treatment well    Behavior During Therapy [facility] for tasks assessed/performed             Past Medical History:  Diagnosis Date   Acute CVA (cerebrovascular accident) ([place]) 09/09/2020   Chronic anticoagulation 11/14/2014   CKD (chronic kidney disease) stage 3, GFR 30-59 ml/min ([place]) 10/24/2019   CKD (chronic kidney disease) stage 3, GFR 30-59 ml/min ([place]) 10/24/2019   Clotting disorder ([place])    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 two simple tubular adenomas removed. patient was advised to go to [facility] for colonoscopy but he did not keep appt.   IR CT HEAD LTD  02/02/2021   IR PERCUTANEOUS ART THROMBECTOMY/INFUSION INTRACRANIAL INC DIAG ANGIO  02/02/2021   RADIOLOGY WITH ANESTHESIA N/A  02/02/2021   Procedure: IR WITH ANESTHESIA;  Surgeon: Luanne Bras, MD;  Location: [facility];  Service: Radiology;  Laterality: N/A;   TEE WITHOUT CARDIOVERSION N/A 09/10/2020   Procedure: TRANSESOPHAGEAL ECHOCARDIOGRAM (TEE) WITH PROPOFOL;  Surgeon: Satira Sark, MD;  Location: [facility];  Service: Cardiovascular;  Laterality: N/A;    There were no vitals filed for this visit.                      [facility] Adult PT Treatment/Exercise - 09/23/21 0001       Ambulation/Gait   Ambulation/Gait Yes    Ambulation/Gait Assistance 4: Min assist    Ambulation Distance (Feet) --   2 min intervals   Assistive device Body weight support system   treadmill   Gait Pattern Step-through pattern    Gait velocity 0.3 mph    Stairs Yes    Stairs Assistance 4: Min guard    Stair Management Technique One rail Right    Number of Stairs 1    Gait Comments gait training on tredmill with therapist performing LLE kinematics with pt performing increased right step length with visual cue needed for RLE to target. Performed 6 trials as such. Seated rest periods needed between                       PT Short Term  Goals - 09/09/21 1400       PT SHORT TERM GOAL #1   Title Patient will be independent with initial HEP and self-management strategies to improve functional outcomes    Baseline "walking and standing mainly using quad cane"    Time 4    Period Weeks    Status On-going    Target Date 09/30/21      PT SHORT TERM GOAL #2   Title Patient will be Min A with transfers and bed mobility    Time 4    Period Weeks    Status Achieved    Target Date 06/19/21      PT SHORT TERM GOAL #3   Title Decrease risk for falls per score 45/56 Berg Balance test    Baseline 32/56 indicating high risk for falls    Time 3    Period Weeks    Status New    Target Date 09/30/21               PT Long Term Goals - 09/09/21 1411       PT LONG TERM GOAL #1   Title Patient will be  Mod I with bed mobility and transfers    Baseline Mod independent bed mobility; set-up to modified indep. transfers    Time 8    Period Weeks    Status Achieved      PT LONG TERM GOAL #2   Title Patient will be able to ambulate >100 feet with LRAD for improved household mobility    Baseline Current 115 feet with HW    Time 8    Period Weeks    Status Achieved      PT LONG TERM GOAL #3   Title Patient will have at least 4/5 MMT throughout LLE (except ankle DF) for improved functional mobility and stair/ step navigation.    Baseline See MMT    Time 8    Period Weeks    Status On-going    Target Date 10/21/21                   Plan - 09/23/21 1402     Clinical Impression Statement Able to progress with step length trials with therapist assisting LLE kinematics.  Improved LLE spontaneous response with gait trials demonstrating knee extension at initial contact. Continued sessions to improve motor control and gait/balance to reduce risk for falls    Examination-Activity Limitations Caring for Others;Transfers;Locomotion Level;Toileting;Stand    Examination-Participation Restrictions Yard Work;Cleaning;Laundry;Community Activity    Stability/Clinical Decision Making Evolving/Moderate complexity    Rehab Potential Fair    PT Frequency 2x / week    PT Duration 4 weeks    PT Treatment/Interventions ADLs/Self Care Home Management;Aquatic Therapy;Biofeedback;Cryotherapy;Fluidtherapy;Contrast Bath;Electrical Stimulation;Therapeutic exercise;Orthotic Fit/Training;Patient/family education;Manual lymph drainage;Manual techniques;Dry needling;Energy conservation;Spinal Manipulations;Joint Manipulations;Splinting;Taping;Compression bandaging;Vasopneumatic Device;Therapeutic activities;Parrafin;Ultrasound;Traction;Functional mobility training;Neuromuscular re-education;Stair training;Gait training;Moist Heat;Iontophoresis 4mg /ml Dexamethasone;DME Instruction;Balance training;Passive range of  motion;Scar mobilization;Wheelchair mobility training;Visual/perceptual remediation/compensation    PT Next Visit Plan Progress functional mobility as tolerated. Encourage WB through LT side. Gait. F/U about AFO fitting    PT Home Exercise Plan Eval: marches, LAQs, heel/toe raise; 7/1:  bridge and sidelying clam 7/12 heel slide, supine hip abduction; tall sitting with lumbar support 10/13 seated clam    Consulted and Agree with Plan of Care Patient             Patient will benefit from skilled therapeutic intervention in order to improve the following deficits and impairments:  Abnormal gait,  Improper body mechanics, Impaired sensation, Decreased balance, Difficulty walking, Decreased activity tolerance, Decreased range of motion, Decreased strength, Hypomobility, Impaired tone, Decreased mobility, Decreased coordination  Visit Diagnosis: Other symptoms and signs involving the musculoskeletal system  Other lack of coordination  Difficulty in walking, not elsewhere classified  Muscle weakness (generalized)  Other abnormalities of gait and mobility     Problem List Patient Active Problem List   Diagnosis Date Noted   Urinary frequency    Dysphagia, post-stroke    Spastic hemiplegia affecting nondominant side (HCC)    Drug-induced hypotension    Pressure injury of skin 02/14/2021   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   Dyslipidemia    Hemiparesis affecting left side as late effect of stroke ([place])    Middle cerebral artery embolism, right 02/03/2021   Status post surgery 02/02/2021   Stroke (cerebrum) ([place]) 02/02/2021   Hypoglycemia 01/25/2021   Thyroid nodule 01/25/2021   Hypothyroidism 01/25/2021   Near syncope 01/21/2021   H/O adenomatous polyp of colon 12/30/2020   Acute ischemic stroke ([place]) 11/28/2020   Essential hypertension 11/28/2020   Mixed hyperlipidemia 11/28/2020   Acute CVA (cerebrovascular accident) ([place]) 09/09/2020   Transient ischemic  attack 09/07/2020   History of hypertension 09/07/2020   Elevated troponin 09/07/2020   Dizziness 09/07/2020   Numbness and tingling 09/07/2020   H/O medication noncompliance 09/07/2020   Medicare annual wellness visit, subsequent 12/05/2019   Nicotine dependence 12/05/2019   Chronic kidney disease, stage 3 unspecified ([place]) 10/24/2019   Prediabetes 10/24/2019   Edema of foot 09/14/2017   Tubular adenoma of colon 09/14/2017   Long term (current) use of anticoagulants 11/14/2014   Sickle cell trait ([place]) 08/29/2012   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

## 2021-09-24 ENCOUNTER — Encounter: Payer: Self-pay | Admitting: Internal Medicine

## 2021-09-24 ENCOUNTER — Ambulatory Visit (INDEPENDENT_AMBULATORY_CARE_PROVIDER_SITE_OTHER): Payer: Medicare Other | Admitting: Internal Medicine

## 2021-09-24 VITALS — BP 110/67 | HR 55 | Resp 16 | Ht 72.0 in | Wt 234.0 lb

## 2021-09-24 DIAGNOSIS — Z1159 Encounter for screening for other viral diseases: Secondary | ICD-10-CM

## 2021-09-24 DIAGNOSIS — R351 Nocturia: Secondary | ICD-10-CM

## 2021-09-24 DIAGNOSIS — I10 Essential (primary) hypertension: Secondary | ICD-10-CM | POA: Diagnosis not present

## 2021-09-24 DIAGNOSIS — Z0000 Encounter for general adult medical examination without abnormal findings: Secondary | ICD-10-CM | POA: Diagnosis not present

## 2021-09-24 DIAGNOSIS — E782 Mixed hyperlipidemia: Secondary | ICD-10-CM | POA: Diagnosis not present

## 2021-09-24 DIAGNOSIS — N1831 Chronic kidney disease, stage 3a: Secondary | ICD-10-CM

## 2021-09-24 DIAGNOSIS — I69391 Dysphagia following cerebral infarction: Secondary | ICD-10-CM | POA: Diagnosis not present

## 2021-09-24 DIAGNOSIS — Z7901 Long term (current) use of anticoagulants: Secondary | ICD-10-CM

## 2021-09-24 DIAGNOSIS — Z2821 Immunization not carried out because of patient refusal: Secondary | ICD-10-CM | POA: Diagnosis not present

## 2021-09-24 DIAGNOSIS — N400 Enlarged prostate without lower urinary tract symptoms: Secondary | ICD-10-CM | POA: Insufficient documentation

## 2021-09-24 DIAGNOSIS — E039 Hypothyroidism, unspecified: Secondary | ICD-10-CM

## 2021-09-24 DIAGNOSIS — I6930 Unspecified sequelae of cerebral infarction: Secondary | ICD-10-CM | POA: Diagnosis not present

## 2021-09-24 DIAGNOSIS — N401 Enlarged prostate with lower urinary tract symptoms: Secondary | ICD-10-CM

## 2021-09-24 NOTE — Patient Instructions (Signed)
Please continue to take medications as prescribed.  Please call to schedule follow-up with Dr. Theador Hawthorne.

## 2021-09-24 NOTE — Assessment & Plan Note (Signed)
On Pradaxa due to history of cryptogenic stroke, DVT and PE

## 2021-09-24 NOTE — Progress Notes (Addendum)
New Patient Office Visit  Subjective:  Patient ID: Matthew Wise, male    DOB: 1961-09-02  Age: 60 y.o. 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  His sister is present during the visit.  History of CVA: Chart review suggests history of cryptogenic stroke s/p thrombectomy.  He follows up with PMNR and neurology for it.  He is on aspirin, statin and Pradaxa currently.  He also takes Keppra for seizure ppx.  He is wheelchair-bound currently.  His sister takes care of him and helps him with his medications.  He denies any urinary or stool incontinence currently.  He is enrolled in PT currently.  HTN: BP is well-controlled. Takes medications regularly. Patient denies headache, dizziness, chest pain, dyspnea or palpitations.  He has a history of CKD stage III, for which she used to follow-up with Dr. Theador Hawthorne, but has not seen him recently and does not have his contact info currently.  He denies any dysuria or hematuria.  He takes Flomax for BPH.  He takes senna for chronic constipation.  Denies any melena or hematochezia.  He refuses flu vaccine today.    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 two simple tubular adenomas removed. patient was advised to go to [facility] for colonoscopy but he did not keep appt.   IR CT HEAD LTD  02/02/2021   IR PERCUTANEOUS ART THROMBECTOMY/INFUSION INTRACRANIAL INC DIAG ANGIO  02/02/2021   RADIOLOGY WITH ANESTHESIA N/A 02/02/2021   Procedure: IR WITH ANESTHESIA;  Surgeon: Luanne Bras, MD;  Location: [facility];  Service: Radiology;  Laterality: N/A;   TEE WITHOUT CARDIOVERSION N/A 09/10/2020   Procedure: TRANSESOPHAGEAL ECHOCARDIOGRAM (TEE) WITH PROPOFOL;  Surgeon: Satira Sark, MD;  Location: [facility];  Service: Cardiovascular;  Laterality: N/A;    Family History  Problem Relation Age of Onset   COPD Father    Stroke Brother    Pulmonary embolism Sister    Throat cancer Brother    Arthritis Mother    Diabetes Mother    Stroke Mother    Hypertension Brother    Diabetes Brother    Arthritis Brother  Diabetes Sister    Colon cancer Neg Hx     Social History   Socioeconomic History   Marital status: Divorced    Spouse name: Not on file   Number of children: 2   Years of education: 12   Highest education level: Not on file  Occupational History   Occupation: disability    Comment: DVT  Tobacco Use   Smoking status: Former    Packs/day: 1.00    Years: 30.00    Pack years: 30.00    Types: Cigarettes    Start date: 12/01/1983   Smokeless tobacco: Never  Vaping Use   Vaping Use: Never used   Substance and Sexual Activity   Alcohol use: Not Currently    Alcohol/week: 3.0 standard drinks    Types: 3 Cans of beer per week    Comment: has not drank sice 11/27/20   Drug use: Not Currently    Comment: REMOTE HISTORY, Quit in 2007   Sexual activity: Yes  Other Topics Concern   Not on file  Social History Narrative   Lives with sister   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   Right Handed    Drinks caffeine on occassion   Social Determinants of Health   Financial Resource Strain: Not on file  Food Insecurity: Not on file  Transportation Needs: Not on file  Physical Activity: Not on file  Stress: Not on file  Social Connections: Not on file  Intimate Partner Violence: Not on file    ROS Review of Systems  Constitutional:  Positive for fatigue. Negative for chills and fever.  HENT:  Negative for congestion and sore throat.   Eyes:  Negative for pain and discharge.  Respiratory:  Negative for cough and shortness of breath.   Cardiovascular:  Negative for chest pain and palpitations.  Gastrointestinal:  Positive for constipation. Negative for diarrhea, nausea and vomiting.  Endocrine: Negative for polydipsia and polyuria.  Genitourinary:  Negative for dysuria and hematuria.  Musculoskeletal:  Positive for arthralgias and neck stiffness. Negative for neck pain.  Skin:  Negative for rash.  Neurological:  Positive for weakness and numbness. Negative for dizziness and headaches.  Psychiatric/Behavioral:  Negative for agitation and behavioral problems.    Objective:   Today's Vitals: BP 110/67 (BP Location: Right Arm, Patient Position: Sitting, Cuff Size: Normal)   Pulse (!) 55   Resp 16   Ht 6' (1.829 m)   Wt 234 lb (106.1 kg)   SpO2 96%   BMI 31.74 kg/m   Physical Exam Vitals reviewed.  Constitutional:      General: He is not in acute distress.    Appearance: He is not diaphoretic.     Comments: In wheelchair  HENT:     Head: Normocephalic and  atraumatic.     Nose: Nose normal.     Mouth/Throat:     Mouth: Mucous membranes are moist.  Eyes:     General: No scleral icterus.    Extraocular Movements: Extraocular movements intact.  Cardiovascular:     Rate and Rhythm: Normal rate and regular rhythm.     Pulses: Normal pulses.     Heart sounds: Normal heart sounds. No murmur heard. Pulmonary:     Breath sounds: Normal breath sounds. No wheezing or rales.  Abdominal:     Palpations: Abdomen is soft.     Tenderness: There is no abdominal tenderness.  Musculoskeletal:     Cervical back: Neck supple. No tenderness.  Right lower leg: No edema.     Left lower leg: No edema.  Skin:    General: Skin is warm.     Findings: No rash.  Neurological:     General: No focal deficit present.     Mental Status: He is alert and oriented to person, place, and time.     Sensory: Sensory deficit (Left sided hemiparesis) present.     Motor: Weakness (Left UE and LE 1/5) present.  Psychiatric:        Mood and Affect: Mood normal.        Behavior: Behavior normal.    Assessment & Plan:   Problem List Items Addressed This Visit       Encounter for medical examination to establish care    -  Primary  Relevant Orders  CBC with Differential/Platelet  CMP14+EGFR   History of CVA with residual deficit   CVA in 10/2020 and 01/2021, s/p thrombectomy Has residual left-sided hemiplegia and hemiparesis Wheelchair-bound On aspirin, statin and Pradaxa 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 Has not seen cardiology in outpatient setting, will discuss referral in the next visit Check CBC and lipid profile today     Relevant Orders  Lipid panel  TSH    Cardiovascular and Mediastinum   Essential hypertension    BP Readings from Last 1 Encounters:  09/24/21 110/67  Well-controlled with lisinopril and amlodipine Counseled for compliance with the medications Advised DASH diet        Digestive   Dysphagia, post-stroke    Now  improved On Protonix 40 mg QD        Endocrine   Hypothyroidism    Lab Results  Component Value Date   TSH 1.92 04/10/2021  On Levothyroxine 25 mcg QD Check TSH        Genitourinary   Chronic kidney disease, stage 3 unspecified (HCC)    Used to follow-up with Dr. Theador Hawthorne, needs to schedule appointment Avoid nephrotoxic agents Maintain adequate hydration      BPH (benign prostatic hyperplasia)    On Flomax        Other   Long term (current) use of anticoagulants    On Pradaxa due to history of cryptogenic stroke, DVT and PE      Relevant Orders   CBC with Differential/Platelet   Mixed hyperlipidemia    On statin Check lipid profile      Other Visit Diagnoses     Need for hepatitis C screening test       Relevant Orders   Hepatitis C Antibody       Outpatient Encounter Medications as of 09/24/2021  Medication Sig   acetaminophen (TYLENOL) 325 MG tablet Take 2 tablets (650 mg total) by mouth every 4 (four) hours as needed for mild pain (or temp > 37.5 C (99.5 F)).   amLODipine (NORVASC) 5 MG tablet TAKE 1 TABLET (5 MG TOTAL) BY MOUTH DAILY.   aspirin EC 81 MG EC tablet Take 1 tablet (81 mg total) by mouth daily. Swallow whole.   atorvastatin (LIPITOR) 40 MG tablet TAKE 1 TABLET BY MOUTH EVERY DAY   dabigatran (PRADAXA) 150 MG CAPS capsule Take 1 capsule (150 mg total) by mouth every 12 (twelve) hours.   levETIRAcetam (KEPPRA) 500 MG tablet TAKE 1 TABLET BY MOUTH TWICE A DAY   levothyroxine (SYNTHROID) 25 MCG tablet TAKE 1 TABLET BY MOUTH EVERY DAY   lisinopril (ZESTRIL) 10 MG tablet Take 1 tablet (10 mg total) by mouth daily.  pantoprazole (PROTONIX) 40 MG tablet TAKE 1 TABLET BY MOUTH EVERYDAY AT BEDTIME   senna-docusate (SENOKOT-S) 8.6-50 MG tablet Take 1 tablet by mouth at bedtime.   tamsulosin (FLOMAX) 0.4 MG CAPS capsule TAKE 1 CAPSULE (0.4 MG TOTAL) BY MOUTH DAILY AFTER SUPPER.   [DISCONTINUED] docusate sodium (COLACE) 100 MG capsule Take 1 capsule  (100 mg total) by mouth daily. (Patient not taking: Reported on 09/24/2021)   [DISCONTINUED] hydrALAZINE (APRESOLINE) 50 MG tablet Take 50 mg by mouth every 8 (eight) hours. (Patient not taking: Reported on 09/24/2021)   No facility-administered encounter medications on file as of 09/24/2021.    Follow-up: Return in about 4 months (around 01/25/2022) for HTN, CVA and blood tests.   Lindell Spar, MD

## 2021-09-24 NOTE — Assessment & Plan Note (Signed)
Lab Results  Component Value Date   TSH 1.92 04/10/2021   On Levothyroxine 25 mcg QD Check TSH

## 2021-09-24 NOTE — Assessment & Plan Note (Signed)
On Flomax 

## 2021-09-24 NOTE — Assessment & Plan Note (Signed)
Now improved On Protonix 40 mg QD

## 2021-09-24 NOTE — Assessment & Plan Note (Addendum)
BP Readings from Last 1 Encounters:  09/24/21 110/67   Well-controlled with lisinopril and amlodipine Counseled for compliance with the medications Advised DASH diet

## 2021-09-24 NOTE — Assessment & Plan Note (Signed)
Used to follow-up with Dr. Theador Hawthorne, needs to schedule appointment Avoid nephrotoxic agents Maintain adequate hydration

## 2021-09-24 NOTE — Assessment & Plan Note (Signed)
CVA in 10/2020 and 01/2021, s/p thrombectomy Has residual left-sided hemiplegia and hemiparesis Wheelchair-bound On aspirin, statin and Pradaxa 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 Has not seen cardiology in outpatient setting, will discuss referral in the next visit Check CBC and lipid profile today

## 2021-09-24 NOTE — Assessment & Plan Note (Signed)
On statin Check lipid profile 

## 2021-09-25 ENCOUNTER — Ambulatory Visit (HOSPITAL_COMMUNITY): Payer: Medicare Other | Admitting: Physical Therapy

## 2021-09-25 ENCOUNTER — Other Ambulatory Visit: Payer: Self-pay

## 2021-09-25 ENCOUNTER — Encounter (HOSPITAL_COMMUNITY): Payer: Medicare Other

## 2021-09-25 ENCOUNTER — Encounter (HOSPITAL_COMMUNITY): Payer: Self-pay | Admitting: Physical Therapy

## 2021-09-25 DIAGNOSIS — R262 Difficulty in walking, not elsewhere classified: Secondary | ICD-10-CM

## 2021-09-25 DIAGNOSIS — M6281 Muscle weakness (generalized): Secondary | ICD-10-CM

## 2021-09-25 DIAGNOSIS — R278 Other lack of coordination: Secondary | ICD-10-CM

## 2021-09-25 DIAGNOSIS — R29898 Other symptoms and signs involving the musculoskeletal system: Secondary | ICD-10-CM

## 2021-09-25 LAB — CMP14+EGFR
ALT: 28 IU/L (ref 0–44)
AST: 23 IU/L (ref 0–40)
Albumin/Globulin Ratio: 1.6 (ref 1.2–2.2)
Albumin: 4.4 g/dL (ref 3.8–4.9)
Alkaline Phosphatase: 127 IU/L — ABNORMAL HIGH (ref 44–121)
BUN/Creatinine Ratio: 12 (ref 9–20)
BUN: 18 mg/dL (ref 6–24)
Bilirubin Total: 1 mg/dL (ref 0.0–1.2)
CO2: 27 mmol/L (ref 20–29)
Calcium: 9.6 mg/dL (ref 8.7–10.2)
Chloride: 103 mmol/L (ref 96–106)
Creatinine, Ser: 1.56 mg/dL — ABNORMAL HIGH (ref 0.76–1.27)
Globulin, Total: 2.7 g/dL (ref 1.5–4.5)
Glucose: 84 mg/dL (ref 70–99)
Potassium: 4 mmol/L (ref 3.5–5.2)
Sodium: 142 mmol/L (ref 134–144)
Total Protein: 7.1 g/dL (ref 6.0–8.5)
eGFR: 51 mL/min/{1.73_m2} — ABNORMAL LOW (ref >59)

## 2021-09-25 LAB — LIPID PANEL
Chol/HDL Ratio: 2.5 ratio (ref 0.0–5.0)
Cholesterol, Total: 119 mg/dL (ref 100–199)
HDL: 48 mg/dL (ref >39)
LDL Chol Calc (NIH): 57 mg/dL (ref 0–99)
Triglycerides: 63 mg/dL (ref 0–149)
VLDL Cholesterol Cal: 14 mg/dL (ref 5–40)

## 2021-09-25 LAB — CBC WITH DIFFERENTIAL/PLATELET
Basophils Absolute: 0 10*3/uL (ref 0.0–0.2)
Basos: 1 %
EOS (ABSOLUTE): 0 10*3/uL (ref 0.0–0.4)
Eos: 1 %
Hematocrit: 36.8 % — ABNORMAL LOW (ref 37.5–51.0)
Hemoglobin: 12.6 g/dL — ABNORMAL LOW (ref 13.0–17.7)
Immature Grans (Abs): 0 10*3/uL (ref 0.0–0.1)
Immature Granulocytes: 0 %
Lymphocytes Absolute: 1.6 10*3/uL (ref 0.7–3.1)
Lymphs: 46 %
MCH: 27.8 pg (ref 26.6–33.0)
MCHC: 34.2 g/dL (ref 31.5–35.7)
MCV: 81 fL (ref 79–97)
Monocytes Absolute: 0.3 10*3/uL (ref 0.1–0.9)
Monocytes: 9 %
Neutrophils Absolute: 1.5 10*3/uL (ref 1.4–7.0)
Neutrophils: 43 %
Platelets: 179 10*3/uL (ref 150–450)
RBC: 4.53 x10E6/uL (ref 4.14–5.80)
RDW: 13.3 % (ref 11.6–15.4)
WBC: 3.5 10*3/uL (ref 3.4–10.8)

## 2021-09-25 LAB — TSH: TSH: 2.6 u[IU]/mL (ref 0.450–4.500)

## 2021-09-25 LAB — HEPATITIS C ANTIBODY: Hep C Virus Ab: <0.1 s/co ratio (ref 0.0–0.9)

## 2021-09-25 NOTE — Therapy (Signed)
[facility] [address] [place], [place], [address] Phone: [phone]   Fax:  [phone]  Physical Therapy Treatment  Patient Details  Name: Matthew Wise MRN: [id number] Date of Birth: 1960-12-18 Referring Provider (PT): Alysia Penna MD   Encounter Date: 09/25/2021   PT End of Session - 09/25/21 1313     Visit Number 33    Number of Visits 56    Date for PT Re-Evaluation 10/21/21    Authorization Type [employer]    Progress Note Due on Visit 83    PT Start Time 1306    PT Stop Time 1346    PT Time Calculation (min) 40 min    Equipment Utilized During Treatment Gait belt    Activity Tolerance Patient tolerated treatment well    Behavior During Therapy [facility] for tasks assessed/performed             Past Medical History:  Diagnosis Date   Acute CVA (cerebrovascular accident) ([place]) 09/09/2020   Chronic anticoagulation 11/14/2014   CKD (chronic kidney disease) stage 3, GFR 30-59 ml/min ([place]) 10/24/2019   CKD (chronic kidney disease) stage 3, GFR 30-59 ml/min ([place]) 10/24/2019   Clotting disorder ([place])    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 two simple tubular adenomas removed. patient was advised to go to [facility] for  colonoscopy but he did not keep appt.   IR CT HEAD LTD  02/02/2021   IR PERCUTANEOUS ART THROMBECTOMY/INFUSION INTRACRANIAL INC DIAG ANGIO  02/02/2021   RADIOLOGY WITH ANESTHESIA N/A 02/02/2021   Procedure: IR WITH ANESTHESIA;  Surgeon: Luanne Bras, MD;  Location: [facility];  Service: Radiology;  Laterality: N/A;   TEE WITHOUT CARDIOVERSION N/A 09/10/2020   Procedure: TRANSESOPHAGEAL ECHOCARDIOGRAM (TEE) WITH PROPOFOL;  Surgeon: Satira Sark, MD;  Location: [facility];  Service: Cardiovascular;  Laterality: N/A;    There were no vitals filed for this visit.   Subjective Assessment - 09/25/21 1311     Subjective Patient reports nothing new. "Pretty much about the same". Still waiting on AFO, has appt on 11/7 for fitting, but not sure how long until he receives brace.    Pertinent History CVA 02/02/21    Limitations Walking;Standing;House hold activities    Currently in Pain? No/denies                               [facility] Adult PT Treatment/Exercise - 09/25/21 0001       Knee/Hip Exercises: Aerobic   Nustep 4 min Lv 1 for mobility and sequencing      Knee/Hip Exercises: Standing   Gait Training 60 feet with quad cane    Other Standing Knee Exercises side stepping in // bars  x3RT, FWD/Retro walk in // bars 3RT   cues for ground clearance and LT foot position     Knee/Hip Exercises: Seated   Sit to Sand with UE support;1 set;10 reps   Cues for hand and foot placement                      PT Short Term Goals - 09/09/21 1400       PT SHORT TERM GOAL #1   Title Patient will be independent with initial HEP and self-management strategies to improve functional outcomes    Baseline "walking and standing mainly using quad cane"    Time 4    Period Weeks    Status On-going    Target Date 09/30/21      PT SHORT TERM GOAL #2   Title Patient will be Min A with transfers and bed mobility    Time 4    Period Weeks    Status Achieved    Target Date  06/19/21      PT SHORT TERM GOAL #3   Title Decrease risk for falls per score 45/56 Berg Balance test    Baseline 32/56 indicating high risk for falls    Time 3    Period Weeks    Status New    Target Date 09/30/21               PT Long Term Goals - 09/09/21 1411       PT LONG TERM GOAL #1   Title Patient will be Mod I with bed mobility and transfers    Baseline Mod independent bed mobility; set-up to modified indep. transfers    Time 8    Period Weeks    Status Achieved      PT LONG TERM GOAL #2   Title Patient will be able to ambulate >100 feet with LRAD for improved household mobility    Baseline Current 115 feet with HW    Time 8    Period Weeks    Status Achieved      PT LONG TERM GOAL #3   Title Patient will have at least 4/5 MMT throughout LLE (except ankle DF) for improved functional mobility and stair/ step navigation.    Baseline See MMT    Time 8    Period Weeks    Status On-going    Target Date 10/21/21                   Plan - 09/25/21 1343     Clinical Impression Statement Patient function appears to be near plateau. Would like to have AFO for further functional progress, as this is currently most limiting factor. Encouraged patient to reach out to orthotic company to get AFO and hold therapy until able to progress further with functional adaptive equipment. Gait, balance and transfers appear to have plateaued at this point. Follow up/ return to therapy as indicated.    Examination-Activity Limitations Caring for Others;Transfers;Locomotion Level;Toileting;Stand    Examination-Participation Restrictions Yard Work;Cleaning;Laundry;Community Activity    Stability/Clinical Decision Making Evolving/Moderate complexity    Rehab Potential Fair    PT Frequency 2x / week    PT Duration 4 weeks    PT Treatment/Interventions ADLs/Self Care Home Management;Aquatic Therapy;Biofeedback;Cryotherapy;Fluidtherapy;Contrast Bath;Electrical  Stimulation;Therapeutic exercise;Orthotic Fit/Training;Patient/family education;Manual lymph drainage;Manual techniques;Dry needling;Energy conservation;Spinal Manipulations;Joint Manipulations;Splinting;Taping;Compression bandaging;Vasopneumatic Device;Therapeutic activities;Parrafin;Ultrasound;Traction;Functional mobility training;Neuromuscular re-education;Stair training;Gait training;Moist Heat;Iontophoresis 4mg /ml Dexamethasone;DME Instruction;Balance training;Passive range of motion;Scar mobilization;Wheelchair mobility training;Visual/perceptual remediation/compensation    PT Next  Visit Plan Return for funcitonal progression with AFO in hand, cannot progress gait without, ordered 6-7 weeks ago, still doesnt have this    PT Home Exercise Plan Eval: marches, LAQs, heel/toe raise; 7/1:  bridge and sidelying clam 7/12 heel slide, supine hip abduction; tall sitting with lumbar support 10/13 seated clam    Consulted and Agree with Plan of Care Patient             Patient will benefit from skilled therapeutic intervention in order to improve the following deficits and impairments:  Abnormal gait, Improper body mechanics, Impaired sensation, Decreased balance, Difficulty walking, Decreased activity tolerance, Decreased range of motion, Decreased strength, Hypomobility, Impaired tone, Decreased mobility, Decreased coordination  Visit Diagnosis: Other symptoms and signs involving the musculoskeletal system  Other lack of coordination  Difficulty in walking, not elsewhere classified  Muscle weakness (generalized)     Problem List Patient Active Problem List   Diagnosis Date Noted   BPH (benign prostatic hyperplasia) 09/24/2021   History of CVA with residual deficit 09/24/2021   Dysphagia, post-stroke    Spastic hemiplegia affecting nondominant side (HCC)    Hemiparesis affecting left side as late effect of stroke ([place])    Thyroid nodule 01/25/2021   Hypothyroidism 01/25/2021   H/O  adenomatous polyp of colon 12/30/2020   Essential hypertension 11/28/2020   Mixed hyperlipidemia 11/28/2020   H/O medication noncompliance 09/07/2020   Nicotine dependence 12/05/2019   Chronic kidney disease, stage 3 unspecified ([place]) 10/24/2019   Prediabetes 10/24/2019   Tubular adenoma of colon 09/14/2017   Long term (current) use of anticoagulants 11/14/2014   Sickle cell trait ([place]) 08/29/2012   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 [place], [place], [address] Phone: [phone]   Fax:  [phone]  Name: ALAIN DESCHENE MRN: [id number] Date of Birth: 1961-09-21

## 2021-09-27 ENCOUNTER — Other Ambulatory Visit: Payer: Self-pay | Admitting: Registered Nurse

## 2021-09-27 DIAGNOSIS — D6851 Activated protein C resistance: Secondary | ICD-10-CM

## 2021-09-27 DIAGNOSIS — Z7901 Long term (current) use of anticoagulants: Secondary | ICD-10-CM

## 2021-09-27 DIAGNOSIS — Z8673 Personal history of transient ischemic attack (TIA), and cerebral infarction without residual deficits: Secondary | ICD-10-CM

## 2021-09-27 DIAGNOSIS — I10 Essential (primary) hypertension: Secondary | ICD-10-CM

## 2021-09-30 ENCOUNTER — Encounter: Payer: Self-pay | Admitting: *Deleted

## 2021-10-01 ENCOUNTER — Other Ambulatory Visit: Payer: Self-pay

## 2021-10-01 ENCOUNTER — Ambulatory Visit (INDEPENDENT_AMBULATORY_CARE_PROVIDER_SITE_OTHER): Payer: Medicare Other

## 2021-10-01 DIAGNOSIS — Z0000 Encounter for general adult medical examination without abnormal findings: Secondary | ICD-10-CM

## 2021-10-01 NOTE — Progress Notes (Signed)
Subjective:   Matthew Wise is a 60 y.o. male who presents for Medicare Annual/Subsequent preventive examination.  I connected with  Matthew Wise on 10/01/21 by a audio enabled telemedicine application and verified that I am speaking with the correct person using two identifiers.   I discussed the limitations, risks, security and privacy concerns of performing an evaluation and management service by telephone and the availability of in person appointments. I also discussed with the patient that there may be a patient responsible charge related to this service. The patient expressed understanding and verbally consented to this telephonic visit.   Review of Systems           Objective:    There were no vitals filed for this visit. There is no height or weight on file to calculate BMI.  Advanced Directives 05/14/2021 02/12/2021 02/02/2021 01/25/2021 01/22/2021 01/21/2021 11/28/2020  Does Patient Have a Medical Advance Directive? No No No No No No No  Would patient like information on creating a medical advance directive? No - Patient declined No - Patient declined No - Patient declined No - Patient declined No - Patient declined No - Patient declined No - Patient declined    Current Medications (verified) Outpatient Encounter Medications as of 10/01/2021  Medication Sig   acetaminophen (TYLENOL) 325 MG tablet Take 2 tablets (650 mg total) by mouth every 4 (four) hours as needed for mild pain (or temp > 37.5 C (99.5 F)).   amLODipine (NORVASC) 5 MG tablet TAKE 1 TABLET (5 MG TOTAL) BY MOUTH DAILY.   aspirin EC 81 MG EC tablet Take 1 tablet (81 mg total) by mouth daily. Swallow whole.   atorvastatin (LIPITOR) 40 MG tablet TAKE 1 TABLET BY MOUTH EVERY DAY   dabigatran (PRADAXA) 150 MG CAPS capsule Take 1 capsule (150 mg total) by mouth every 12 (twelve) hours.   levETIRAcetam (KEPPRA) 500 MG tablet TAKE 1 TABLET BY MOUTH TWICE A DAY   levothyroxine (SYNTHROID) 25 MCG tablet TAKE 1 TABLET  BY MOUTH EVERY DAY   lisinopril (ZESTRIL) 10 MG tablet Take 1 tablet (10 mg total) by mouth daily.   pantoprazole (PROTONIX) 40 MG tablet TAKE 1 TABLET BY MOUTH EVERYDAY AT BEDTIME   senna-docusate (SENOKOT-S) 8.6-50 MG tablet Take 1 tablet by mouth at bedtime.   tamsulosin (FLOMAX) 0.4 MG CAPS capsule TAKE 1 CAPSULE (0.4 MG TOTAL) BY MOUTH DAILY AFTER SUPPER.   No facility-administered encounter medications on file as of 10/01/2021.    Allergies (verified) Patient has no known allergies.   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 two simple tubular adenomas removed. patient was advised to go to [facility] for colonoscopy but he did not keep appt.   IR CT HEAD LTD  02/02/2021   IR PERCUTANEOUS ART THROMBECTOMY/INFUSION INTRACRANIAL INC DIAG ANGIO  02/02/2021   RADIOLOGY WITH ANESTHESIA N/A 02/02/2021   Procedure: IR WITH ANESTHESIA;  Surgeon: Luanne Bras, MD;  Location: [facility];   Service: Radiology;  Laterality: N/A;   TEE WITHOUT CARDIOVERSION N/A 09/10/2020   Procedure: TRANSESOPHAGEAL ECHOCARDIOGRAM (TEE) WITH PROPOFOL;  Surgeon: Satira Sark, MD;  Location: [facility];  Service: Cardiovascular;  Laterality: N/A;   Family History  Problem Relation Age of Onset   COPD Father    Stroke Brother    Pulmonary embolism Sister    Throat cancer Brother    Arthritis Mother    Diabetes Mother    Stroke Mother    Hypertension Brother    Diabetes Brother    Arthritis Brother    Diabetes Sister    Colon cancer Neg Hx    Social History   Socioeconomic History   Marital status: Divorced    Spouse name: Not on file   Number of children: 2   Years of education: 12   Highest education level: Not on file  Occupational History   Occupation: disability    Comment: DVT  Tobacco Use   Smoking status: Former    Packs/day: 1.00    Years: 30.00    Pack years: 30.00    Types: Cigarettes    Start date: 12/01/1983   Smokeless tobacco: Never  Vaping Use   Vaping Use: Never used  Substance and Sexual Activity   Alcohol use: Not Currently    Alcohol/week: 3.0 standard drinks    Types: 3 Cans of beer per week    Comment: has not drank sice 11/27/20   Drug use: Not Currently    Comment: REMOTE HISTORY, Quit in 2007   Sexual activity: Yes  Other Topics Concern   Not on file  Social History Narrative   Lives with sister   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   Right Handed    Drinks caffeine on occassion   Social Determinants of Health   Financial Resource Strain: Not on file  Food Insecurity: Not on file  Transportation Needs: Not on file  Physical Activity: Not on file  Stress: Not on file  Social Connections: Not on file    Tobacco Counseling Counseling given: Not Answered   Clinical Intake:                 Diabetic?NO         Activities of Daily Living No flowsheet data found.  Patient Care Team: Lindell Spar, MD as PCP -  General (Internal Medicine) Teena Irani, PTA as Physical Therapy Assistant (Physical Therapy) Danie Binder, MD (Inactive) as Consulting Physician (Gastroenterology) Eloise Harman, DO as Consulting Physician (Internal Medicine)  Indicate any recent Medical Services you may have received from other than [facility] providers in the past year (date may be approximate).     Assessment:   This is a routine wellness examination for [place].  Hearing/Vision screen No results found.  Dietary issues and exercise activities discussed:     Goals Addressed   None   Depression Screen [facility] 2/9 Scores 09/24/2021 08/19/2021 05/09/2021 04/10/2021 11/20/2020 01/30/2020 12/05/2019  PHQ -  2 Score 0 2 0 0 0 0 0  PHQ- 9 Score - - - 0 0 - -  Exception Documentation - - - - - Medical reason -    Fall Risk Fall Risk  09/24/2021 08/19/2021 05/09/2021 04/04/2021 01/30/2020  Falls in the past year? 0 0 0 0 0  Number falls in past yr: 0 0 0 - 0  Injury with Fall? 0 0 0 - 0  Risk for fall due to : No Fall Risks - - - No Fall Risks  Follow up Falls evaluation completed - - - Falls evaluation completed    FALL RISK PREVENTION PERTAINING TO THE HOME:  Any stairs in or around the home? No  If so, are there any without handrails? No  Home free of loose throw rugs in walkways, pet beds, electrical cords, etc? Yes  Adequate lighting in your home to reduce risk of falls? Yes   ASSISTIVE DEVICES UTILIZED TO PREVENT FALLS:  Life alert? No  Use of a cane, walker or w/c? Yes  Grab bars in the bathroom? No  Shower chair or bench in shower? No  Elevated toilet seat or a handicapped toilet? No   TIMED UP AND GO:  Was the test performed? No .  Length of time to ambulate 10 feet: N/A sec.     Cognitive Function:     6CIT Screen 12/05/2019  What Year? 0 points  What month? 0 points  What time? 0 points  Count back from 20 0 points  Months in reverse 2 points  Repeat phrase 0 points  Total Score 2     Immunizations Immunization History  Administered Date(s) Administered   Influenza,inj,Quad PF,6+ Mos 09/14/2017   Moderna Sars-Covid-2 Vaccination 02/29/2020   PFIZER(Purple Top)SARS-COV-2 Vaccination 03/29/2020   Td 11/30/2014    TDAP status: Up to date  Flu Vaccine status: Due, Education has been provided regarding the importance of this vaccine. Advised may receive this vaccine at local pharmacy or Health Dept. Aware to provide a copy of the vaccination record if obtained from local pharmacy or Health Dept. Verbalized acceptance and understanding.  Pneumococcal vaccine status: Due, Education has been provided regarding the importance of this vaccine. Advised may receive this vaccine at local pharmacy or Health Dept. Aware to provide a copy of the vaccination record if obtained from local pharmacy or Health Dept. Verbalized acceptance and understanding.  Covid-19 vaccine status: Completed vaccines  Qualifies for Shingles Vaccine? Yes   Zostavax completed No   Shingrix Completed?: No.    Education has been provided regarding the importance of this vaccine. Patient has been advised to call insurance company to determine out of pocket expense if they have not yet received this vaccine. Advised may also receive vaccine at local pharmacy or Health Dept. Verbalized acceptance and understanding.  Screening Tests Health Maintenance  Topic Date Due   Pneumococcal Vaccine 4-43 Years old (1 - PCV) Never done   Zoster Vaccines- Shingrix (1 of 2) Never done   COVID-19 Vaccine (3 - Mixed Product risk series) 04/26/2020   INFLUENZA VACCINE  06/30/2021   TETANUS/TDAP  11/30/2024   COLONOSCOPY (Pts 45-58yrs Insurance coverage will need to be confirmed)  10/28/2025   Hepatitis C Screening  Completed   HIV Screening  Completed   HPV VACCINES  Aged Out    Health Maintenance  Health Maintenance Due  Topic Date Due   Pneumococcal Vaccine 35-36 Years old (1 - PCV) Never done   Zoster  Vaccines-  Shingrix (1 of 2) Never done   COVID-19 Vaccine (3 - Mixed Product risk series) 04/26/2020   INFLUENZA VACCINE  06/30/2021    Colorectal cancer screening: Type of screening: Colonoscopy. Completed 10/29/2015. Repeat every 10 years  Lung Cancer Screening: (Low Dose CT Chest recommended if Age 53-80 years, 30 pack-year currently smoking OR have quit w/in 15years.) does not qualify.   Lung Cancer Screening Referral: NO  Additional Screening:  Hepatitis C Screening: does qualify; Completed 09/24/21  Vision Screening: Recommended annual ophthalmology exams for early detection of glaucoma and other disorders of the eye. Is the patient up to date with their annual eye exam?  No  Who is the provider or what is the name of the office in which the patient attends annual eye exams? N/a If pt is not established with a provider, would they like to be referred to a provider to establish care? No .   Dental Screening: Recommended annual dental exams for proper oral hygiene  Community Resource Referral / Chronic Care Management: CRR required this visit?  No   CCM required this visit?  No      Plan:     I have personally reviewed and noted the following in the patient's chart:   Medical and social history Use of alcohol, tobacco or illicit drugs  Current medications and supplements including opioid prescriptions. Patient is not currently taking opioid prescriptions. Functional ability and status Nutritional status Physical activity Advanced directives List of other physicians Hospitalizations, surgeries, and ER visits in previous 12 months Vitals Screenings to include cognitive, depression, and falls Referrals and appointments  In addition, I have reviewed and discussed with patient certain preventive protocols, quality metrics, and best practice recommendations. A written personalized care plan for preventive services as well as general preventive health recommendations were  provided to patient.     Quentin Angst, [place]   10/01/2021   Nurse Notes: This is a tele health visit with the patient at home. The provider is in the office and is Rutwik Patel,MD

## 2021-10-01 NOTE — Patient Instructions (Signed)
[name] , Thank you for taking time to come for your Medicare Wellness Visit. I appreciate your ongoing commitment to your health goals. Please review the following plan we discussed and let me know if I can assist you in the future.   Screening recommendations/referrals: Colonoscopy: complete Mammogram: n/a Bone Density: due now Recommended yearly ophthalmology/optometry visit for glaucoma screening and checkup Recommended yearly dental visit for hygiene and checkup  Vaccinations: Influenza vaccine: due now Pneumococcal vaccine: due now Tdap vaccine: complete Shingles vaccine: due now     Advanced directives: patient declined   Conditions/risks identified: hypertension   Next appointment: 1 year    Preventive Care 48 Years and Older, Male Preventive care refers to lifestyle choices and visits with your health care provider that can promote health and wellness. What does preventive care include? A yearly physical exam. This is also called an annual well check. Dental exams once or twice a year. Routine eye exams. Ask your health care provider how often you should have your eyes checked. Personal lifestyle choices, including: Daily care of your teeth and gums. Regular physical activity. Eating a healthy diet. Avoiding tobacco and drug use. Limiting alcohol use. Practicing safe sex. Taking low-dose aspirin every day. Taking vitamin and mineral supplements as recommended by your health care provider. What happens during an annual well check? The services and screenings done by your health care provider during your annual well check will depend on your age, overall health, lifestyle risk factors, and family history of disease. Counseling  Your health care provider may ask you questions about your: Alcohol use. Tobacco use. Drug use. Emotional well-being. Home and relationship well-being. Sexual activity. Eating habits. History of falls. Memory and ability to  understand (cognition). Work and work Statistician. Reproductive health. Screening  You may have the following tests or measurements: Height, weight, and BMI. Blood pressure. Lipid and cholesterol levels. These may be checked every 5 years, or more frequently if you are over 29 years old. Skin check. Lung cancer screening. You may have this screening every year starting at age 38 if you have a 30-pack-year history of smoking and currently smoke or have quit within the past 15 years. Fecal occult blood test (FOBT) of the stool. You may have this test every year starting at age 66. Flexible sigmoidoscopy or colonoscopy. You may have a sigmoidoscopy every 5 years or a colonoscopy every 10 years starting at age 73. Hepatitis C blood test. Hepatitis B blood test. Sexually transmitted disease (STD) testing. Diabetes screening. This is done by checking your blood sugar (glucose) after you have not eaten for a while (fasting). You may have this done every 1-3 years. Bone density scan. This is done to screen for osteoporosis. You may have this done starting at age 65. Mammogram. This may be done every 1-2 years. Talk to your health care provider about how often you should have regular mammograms. Talk with your health care provider about your test results, treatment options, and if necessary, the need for more tests. Vaccines  Your health care provider may recommend certain vaccines, such as: Influenza vaccine. This is recommended every year. Tetanus, diphtheria, and acellular pertussis (Tdap, Td) vaccine. You may need a Td booster every 10 years. Zoster vaccine. You may need this after age 14. Pneumococcal 13-valent conjugate (PCV13) vaccine. One dose is recommended after age 50. Pneumococcal polysaccharide (PPSV23) vaccine. One dose is recommended after age 22. Talk to your health care provider about which screenings and vaccines you need  and how often you need them. This information is not  intended to replace advice given to you by your health care provider. Make sure you discuss any questions you have with your health care provider. Document Released: 12/13/2015 Document Revised: 08/05/2016 Document Reviewed: 09/17/2015 Elsevier Interactive Patient Education  2017 [employer] Prevention in the Home Falls can cause injuries. They can happen to people of all ages. There are many things you can do to make your home safe and to help prevent falls. What can I do on the outside of my home? Regularly fix the edges of walkways and driveways and fix any cracks. Remove anything that might make you trip as you walk through a door, such as a raised step or threshold. Trim any bushes or trees on the path to your home. Use bright outdoor lighting. Clear any walking paths of anything that might make someone trip, such as rocks or tools. Regularly check to see if handrails are loose or broken. Make sure that both sides of any steps have handrails. Any raised decks and porches should have guardrails on the edges. Have any leaves, snow, or ice cleared regularly. Use sand or salt on walking paths during winter. Clean up any spills in your garage right away. This includes oil or grease spills. What can I do in the bathroom? Use night lights. Install grab bars by the toilet and in the tub and shower. Do not use towel bars as grab bars. Use non-skid mats or decals in the tub or shower. If you need to sit down in the shower, use a plastic, non-slip stool. Keep the floor dry. Clean up any water that spills on the floor as soon as it happens. Remove soap buildup in the tub or shower regularly. Attach bath mats securely with double-sided non-slip rug tape. Do not have throw rugs and other things on the floor that can make you trip. What can I do in the bedroom? Use night lights. Make sure that you have a light by your bed that is easy to reach. Do not use any sheets or blankets that are  too big for your bed. They should not hang down onto the floor. Have a firm chair that has side arms. You can use this for support while you get dressed. Do not have throw rugs and other things on the floor that can make you trip. What can I do in the kitchen? Clean up any spills right away. Avoid walking on wet floors. Keep items that you use a lot in easy-to-reach places. If you need to reach something above you, use a strong step stool that has a grab bar. Keep electrical cords out of the way. Do not use floor polish or wax that makes floors slippery. If you must use wax, use non-skid floor wax. Do not have throw rugs and other things on the floor that can make you trip. What can I do with my stairs? Do not leave any items on the stairs. Make sure that there are handrails on both sides of the stairs and use them. Fix handrails that are broken or loose. Make sure that handrails are as long as the stairways. Check any carpeting to make sure that it is firmly attached to the stairs. Fix any carpet that is loose or worn. Avoid having throw rugs at the top or bottom of the stairs. If you do have throw rugs, attach them to the floor with carpet tape. Make sure that you  have a light switch at the top of the stairs and the bottom of the stairs. If you do not have them, ask someone to add them for you. What else can I do to help prevent falls? Wear shoes that: Do not have high heels. Have rubber bottoms. Are comfortable and fit you well. Are closed at the toe. Do not wear sandals. If you use a stepladder: Make sure that it is fully opened. Do not climb a closed stepladder. Make sure that both sides of the stepladder are locked into place. Ask someone to hold it for you, if possible. Clearly mark and make sure that you can see: Any grab bars or handrails. First and last steps. Where the edge of each step is. Use tools that help you move around (mobility aids) if they are needed. These  include: Canes. Walkers. Scooters. Crutches. Turn on the lights when you go into a dark area. Replace any light bulbs as soon as they burn out. Set up your furniture so you have a clear path. Avoid moving your furniture around. If any of your floors are uneven, fix them. If there are any pets around you, be aware of where they are. Review your medicines with your doctor. Some medicines can make you feel dizzy. This can increase your chance of falling. Ask your doctor what other things that you can do to help prevent falls. This information is not intended to replace advice given to you by your health care provider. Make sure you discuss any questions you have with your health care provider. Document Released: 09/12/2009 Document Revised: 04/23/2016 Document Reviewed: 12/21/2014 Elsevier Interactive Patient Education  2017 [employer].

## 2021-10-03 ENCOUNTER — Telehealth: Payer: Self-pay | Admitting: Internal Medicine

## 2021-10-03 ENCOUNTER — Other Ambulatory Visit: Payer: Self-pay | Admitting: *Deleted

## 2021-10-03 DIAGNOSIS — Z7901 Long term (current) use of anticoagulants: Secondary | ICD-10-CM

## 2021-10-03 DIAGNOSIS — D6851 Activated protein C resistance: Secondary | ICD-10-CM

## 2021-10-03 DIAGNOSIS — I10 Essential (primary) hypertension: Secondary | ICD-10-CM

## 2021-10-03 DIAGNOSIS — Z8673 Personal history of transient ischemic attack (TIA), and cerebral infarction without residual deficits: Secondary | ICD-10-CM

## 2021-10-03 MED ORDER — DABIGATRAN ETEXILATE MESYLATE 150 MG PO CAPS: 150.0000 mg | ORAL_CAPSULE | Freq: Two times a day (BID) | ORAL | 0 refills | Status: DC

## 2021-10-03 NOTE — Telephone Encounter (Signed)
Pt called in for refill on PRADAXA

## 2021-10-03 NOTE — Telephone Encounter (Signed)
Medication sent to pt pharmacy 

## 2021-10-06 ENCOUNTER — Telehealth: Payer: Self-pay | Admitting: Internal Medicine

## 2021-10-06 NOTE — Telephone Encounter (Signed)
Pt just needs to call [facility] and have it transferred to [place]

## 2021-10-06 NOTE — Telephone Encounter (Signed)
Pt called for refill on PRODAXA.  Prescription was sent to [facility] but is out of stock. Pt wants refill sent to [facility]  Pt on last pill

## 2021-10-07 ENCOUNTER — Other Ambulatory Visit: Payer: Self-pay

## 2021-10-07 ENCOUNTER — Encounter: Payer: Self-pay | Admitting: Physical Medicine & Rehabilitation

## 2021-10-07 ENCOUNTER — Encounter: Payer: Medicare Other | Attending: Registered Nurse | Admitting: Physical Medicine & Rehabilitation

## 2021-10-07 VITALS — BP 100/62 | HR 83 | Temp 98.2°F | Ht 72.0 in | Wt 234.0 lb

## 2021-10-07 DIAGNOSIS — G8110 Spastic hemiplegia affecting unspecified side: Secondary | ICD-10-CM | POA: Insufficient documentation

## 2021-10-07 NOTE — Patient Instructions (Signed)
OnabotulinumtoxinA injection (Medical Use) What is this medication? ONABOTULINUMTOXINA (o na BOTT you lye num tox in eh) is a neuro-muscular blocker. This medicine is used to treat crossed eyes, eyelid spasms, severe neck muscle spasms, ankle and toe muscle spasms, and elbow, wrist, and finger muscle spasms. It is also used to treat excessive underarm sweating, to prevent chronic migraine headaches, and to treat loss of bladder control due to neurologic conditions such as multiple sclerosis or spinal cord injury. This medicine may be used for other purposes; ask your health care provider or pharmacist if you have questions. COMMON BRAND NAME(S): Botox What should I tell my care team before I take this medication? They need to know if you have any of these conditions: breathing problems cerebral palsy spasms difficulty urinating heart problems history of surgery where this medicine is going to be used infection at the site where this medicine is going to be used myasthenia gravis or other neurologic disease nerve or muscle disease surgery plans take medicines that treat or prevent blood clots thyroid problems an unusual or allergic reaction to botulinum toxin, albumin, other medicines, foods, dyes, or preservatives pregnant or trying to get pregnant breast-feeding How should I use this medication? This medicine is for injection into a muscle. It is given by a health care professional in a hospital or clinic setting. Talk to your pediatrician regarding the use of this medicine in children. While this drug may be prescribed for children as young as 17 years old for selected conditions, precautions do apply. Overdosage: If you think you have taken too much of this medicine contact a poison control center or emergency room at once. NOTE: This medicine is only for you. Do not share this medicine with others. What if I miss a dose? This does not apply. What may interact with this  medication? aminoglycoside antibiotics like gentamicin, neomycin, tobramycin muscle relaxants other botulinum toxin injections This list may not describe all possible interactions. Give your health care provider a list of all the medicines, herbs, non-prescription drugs, or dietary supplements you use. Also tell them if you smoke, drink alcohol, or use illegal drugs. Some items may interact with your medicine. What should I watch for while using this medication? Visit your doctor for regular check ups. This medicine will cause weakness in the muscle where it is injected. Tell your doctor if you feel unusually weak in other muscles. Get medical help right away if you have problems with breathing, swallowing, or talking. This medicine might make your eyelids droop or make you see blurry or double. If you have weak muscles or trouble seeing do not drive a car, use machinery, or do other dangerous activities. This medicine contains albumin from human blood. It may be possible to pass an infection in this medicine, but no cases have been reported. Talk to your doctor about the risks and benefits of this medicine. If your activities have been limited by your condition, go back to your regular routine slowly after treatment with this medicine. What side effects may I notice from receiving this medication? Side effects that you should report to your doctor or health care professional as soon as possible: allergic reactions like skin rash, itching or hives, swelling of the face, lips, or tongue breathing problems changes in vision chest pain or tightness eye irritation, pain fast, irregular heartbeat infection numbness speech problems swallowing problems unusual weakness Side effects that usually do not require medical attention (report to your doctor or health care professional  if they continue or are bothersome): bruising or pain at site where injected drooping eyelid dry eyes or  mouth headache muscles aches, pains sensitivity to light tearing This list may not describe all possible side effects. Call your doctor for medical advice about side effects. You may report side effects to FDA at 1-800-FDA-1088. Where should I keep my medication? This drug is given in a hospital or clinic and will not be stored at home. NOTE: This sheet is a summary. It may not cover all possible information. If you have questions about this medicine, talk to your doctor, pharmacist, or health care provider.  2022 Elsevier/Gold Standard (2018-05-30 00:00:00)

## 2021-10-07 NOTE — Telephone Encounter (Signed)
PT AWARE  

## 2021-10-07 NOTE — Progress Notes (Signed)
Subjective:    Patient ID: Matthew Wise, male    DOB: 10-Nov-1961, 60 y.o.   MRN: [id number] 60 y.o. right-handed male with history of pulmonary emboli maintained on Eliquis, hypertension, CKD stage III, hyperlipidemia, prediabetes, sickle cell disease, tobacco abuse as well as history of right MCA territory infarction admission 01/21/2019 until 01/27/2021 and discharged home ambulating 150 feet rolling walker.  Patient lives alone 1 level home 3 steps to entry.  Independent with assistive device.  He has a sister who checks on him regularly.  Presented 02/03/2021 with left hemiparesis, subacute onset.  Cranial CT scan unremarkable for acute intracranial process.  Suspect acute infarct of the right frontal parietal lobe posterior superior to chronic infarct with probable involvement of lateral precentral and postcentral gyri.  Patient did not receive TPA.  CT angiogram of head and neck showed abrupt occlusion of the right M1 segment new as compared to prior CTA of 01/25/2021.  Acute core infarct of the posterior right frontal parietal region largely corresponding with a completed infarct as seen on prior MRI.  EEG evidence of focal seizure arising from the right posterior quadrant during which patient was noted to have left upper extremity twitching.  Additionally there was cortical dysfunction in the right frontal temporal region likely due to underlying stroke.  Most recent echocardiogram with ejection fraction of 55 to 60%.  Admission chemistries unremarkable except creatinine 1.83 urine drug screen negative hemoglobin A1c 5.9 alcohol negative.  Patient underwent arteriogram with revascularization of right MCA M1 segment per interventional radiology.  Latest follow-up MRI showed large acute right MCA territory infarct with petechial hemorrhage and minimal leftward midline shift as well as a small acute left cerebellar infarct new small acute early subacute infarct in left occipital left parietal left frontal  lobes.  Bilateral extremity Dopplers finding consistent with age-indeterminate DVT involving the right common femoral vein and right femoral vein as well as left femoral vein and left popliteal vein, left common femoral and SFA junction.  Patient was placed on intravenous heparin remained on low-dose aspirin and Pradaxa was added.  He had been loaded with Keppra for suspected seizure.  Hospital course further complicated by dysphagia currently on a mechanical soft diet.  Admit date: 02/12/2021 Discharge date: 03/12/2021 HPI On hold  OP PT Awaiting AFO On hold for OT pending eval for Botox The pt discussed Botox with hisOT , pt would like to pursue.  Was discussed at last PMR visit but at that time , pt wanted to think about it.   Cont to need ~mod A for ADLs  Son and his sister involved in his care. Pain Inventory Average Pain 0 Pain Right Now 0 My pain is no pain, weakness on left side of body   LOCATION OF PAIN  No pain  BOWEL Number of stools per week: 4 per week Oral laxative use Yes  Type of laxative Senokot Enema or suppository use No  History of colostomy No  Incontinent No   BLADDER Normal and Pads In and out cath, frequency no Able to self cath No  Bladder incontinence Yes  Frequent urination Yes  Leakage with coughing No  Difficulty starting stream No  Incomplete bladder emptying No    Mobility walk with assistance use a cane ability to climb steps?  no do you drive?  no use a wheelchair needs help with transfers transfers alone Do you have any goals in this area?  yes  Function disabled: date disabled maybe 4-5 yrs  I need assistance with the following:  dressing, bathing, meal prep, household duties, and shopping Do you have any goals in this area?  yes  Neuro/Psych bladder control problems weakness trouble walking depression  Prior Studies Any changes since last visit?  no  Physicians involved in your care Any changes since last visit?  yes Dr.  Ihor Dow (PCP)   Family History  Problem Relation Age of Onset   COPD Father    Stroke Brother    Pulmonary embolism Sister    Throat cancer Brother    Arthritis Mother    Diabetes Mother    Stroke Mother    Hypertension Brother    Diabetes Brother    Arthritis Brother    Diabetes Sister    Colon cancer Neg Hx    Social History   Socioeconomic History   Marital status: Divorced    Spouse name: Not on file   Number of children: 2   Years of education: 12   Highest education level: Not on file  Occupational History   Occupation: disability    Comment: DVT  Tobacco Use   Smoking status: Former    Packs/day: 1.00    Years: 30.00    Pack years: 30.00    Types: Cigarettes    Start date: 12/01/1983   Smokeless tobacco: Never  Vaping Use   Vaping Use: Never used  Substance and Sexual Activity   Alcohol use: Not Currently    Alcohol/week: 3.0 standard drinks    Types: 3 Cans of beer per week    Comment: has not drank sice 11/27/20   Drug use: Not Currently    Comment: REMOTE HISTORY, Quit in 2007   Sexual activity: Yes  Other Topics Concern   Not on file  Social History Narrative   Lives with sister   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   Right Handed    Drinks caffeine on occassion   Social Determinants of Health   Financial Resource Strain: Low Risk    Difficulty of Paying Living Expenses: Not hard at all  Food Insecurity: No Food Insecurity   Worried About Charity fundraiser in the Last Year: Never true   Arboriculturist in the Last Year: Never true  Transportation Needs: No Transportation Needs   Lack of Transportation (Medical): No   Lack of Transportation (Non-Medical): No  Physical Activity: Insufficiently Active   Days of Exercise per Week: 5 days   Minutes of Exercise per Session: 20 min  Stress: No Stress Concern Present   Feeling of Stress : Only a little  Social Connections: Moderately Isolated   Frequency of Communication with Friends  and Family: More than three times a week   Frequency of Social Gatherings with Friends and Family: More than three times a week   Attends Religious Services: 1 to 4 times per year   Active Member of [employer] or Organizations: No   Attends Archivist Meetings: Never   Marital Status: Divorced   Past Surgical History:  Procedure Laterality Date   COLONOSCOPY WITH PROPOFOL N/A 10/29/2015   incomplete due to redundant colon. two simple tubular adenomas removed. patient was advised to go to [facility] for colonoscopy but he did not keep appt.   IR CT HEAD LTD  02/02/2021   IR PERCUTANEOUS ART THROMBECTOMY/INFUSION INTRACRANIAL INC DIAG ANGIO  02/02/2021   RADIOLOGY WITH ANESTHESIA N/A 02/02/2021   Procedure: IR WITH ANESTHESIA;  Surgeon: Luanne Bras, MD;  Location: [facility];  Service: Radiology;  Laterality: N/A;   TEE WITHOUT CARDIOVERSION N/A 09/10/2020   Procedure: TRANSESOPHAGEAL ECHOCARDIOGRAM (TEE) WITH PROPOFOL;  Surgeon: Satira Sark, MD;  Location: [facility];  Service: Cardiovascular;  Laterality: N/A;   Past Medical History:  Diagnosis Date   Acute CVA (cerebrovascular accident) ([place]) 09/09/2020   Chronic anticoagulation 11/14/2014   CKD (chronic kidney disease) stage 3, GFR 30-59 ml/min ([place]) 10/24/2019   CKD (chronic kidney disease) stage 3, GFR 30-59 ml/min ([place]) 10/24/2019   Clotting disorder ([place])    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/2021   Prediabetes 10/24/2019   Pulmonary embolism ([place]) 06/15/2011   Right middle cerebral artery stroke ([place]) 02/12/2021   Thyroid nodule 01/25/2021    Transient ischemic attack 09/07/2020   BP 100/62   Pulse 83   Temp 98.2 F (36.8 C)   Ht 6' (1.829 m)   Wt 234 lb (106.1 kg)   SpO2 99%   BMI 31.74 kg/m   Opioid Risk Score:   Fall Risk Score:  `1  Depression screen PHQ 2/9  Depression screen [facility] 2/9 10/01/2021 10/01/2021 09/24/2021 08/19/2021 05/09/2021 04/10/2021 11/20/2020  Decreased Interest 0 0 0 1 0 0 0  Down, Depressed, Hopeless 0 0 0 1 0 0 0  PHQ - 2 Score 0 0 0 2 0 0 0  Altered sleeping - - - - - 0 0  Tired, decreased energy - - - - - 0 0  Change in appetite - - - - - 0 0  Feeling bad or failure about yourself  - - - - - 0 0  Trouble concentrating - - - - - 0 0  Moving slowly or fidgety/restless - - - - - 0 0  Suicidal thoughts - - - - - 0 0  PHQ-9 Score - - - - - 0 0  Difficult doing work/chores - - - - - Not difficult at all Not difficult at all  Some recent data might be hidden    Review of Systems  Musculoskeletal:  Positive for gait problem.  Neurological:  Positive for weakness.       Left side weakness  All other systems reviewed and are negative.     Objective:   Physical Exam Vitals and nursing note reviewed.  Constitutional:      Appearance: He is obese.  HENT:     Head: Normocephalic and atraumatic.  Eyes:     Extraocular Movements: Extraocular movements intact.     Conjunctiva/sclera: Conjunctivae normal.     Pupils: Pupils are equal, round, and reactive to light.  Skin:    General: Skin is warm and dry.  Neurological:     Mental Status: He is alert and oriented to person, place, and time.     Cranial Nerves: No dysarthria.     Sensory: Sensory deficit present.     Motor: Weakness and abnormal muscle tone present.     Coordination: Coordination abnormal.     Gait: Gait abnormal.     Comments: 5/5 RUE, RLE 3-/5 Left delt, bi , tri, grip,  HF , 4- KE , 2/5 L ankle PF, 0/5 Left ankle DF Sensation intact  Psychiatric:        Mood and Affect: Mood normal.        Behavior: Behavior normal.           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  Persisting issues despite extensive inpt and outpt PT, OT Rec Botox or Xeomin injection 300U   Left  Pec 75 Biceps 75 Brachialis 75 FDS 25 FDP 25 FPL 25  Resume OP OT after botox injected , discussed normal onset and duration

## 2021-10-16 ENCOUNTER — Other Ambulatory Visit: Payer: Self-pay

## 2021-10-16 ENCOUNTER — Encounter (HOSPITAL_COMMUNITY): Payer: Self-pay | Admitting: Physical Therapy

## 2021-10-16 ENCOUNTER — Ambulatory Visit (HOSPITAL_COMMUNITY): Payer: Medicare Other | Attending: Physical Medicine & Rehabilitation | Admitting: Physical Therapy

## 2021-10-16 DIAGNOSIS — R29898 Other symptoms and signs involving the musculoskeletal system: Secondary | ICD-10-CM | POA: Diagnosis present

## 2021-10-16 DIAGNOSIS — R262 Difficulty in walking, not elsewhere classified: Secondary | ICD-10-CM | POA: Insufficient documentation

## 2021-10-16 DIAGNOSIS — R278 Other lack of coordination: Secondary | ICD-10-CM | POA: Diagnosis present

## 2021-10-16 DIAGNOSIS — M6281 Muscle weakness (generalized): Secondary | ICD-10-CM | POA: Insufficient documentation

## 2021-10-16 NOTE — Therapy (Signed)
[facility] [address] [place], [place], [address] Phone: [phone]   Fax:  [phone]  Physical Therapy Treatment  Patient Details  Name: Matthew Wise MRN: [id number] Date of Birth: 02/23/1961 Referring Provider (PT): Andrew Kirsteins MD  PHYSICAL THERAPY DISCHARGE SUMMARY  Visits from Start of Care: 34  Current functional level related to goals / functional outcomes: See below   Remaining deficits: See below   Education / Equipment: See assessment    Patient agrees to discharge. Patient goals were partially met. Patient is being discharged due to maximized rehab potential.    Encounter Date: 10/16/2021   PT End of Session - 10/16/21 1347     Visit Number 34    Number of Visits 43    Date for PT Re-Evaluation 10/21/21    Authorization Type [employer]    Progress Note Due on Visit 41    PT Start Time 1344    PT Stop Time 1425    PT Time Calculation (min) 41 min    Equipment Utilized During Treatment Gait belt    Activity Tolerance Patient tolerated treatment well    Behavior During Therapy WFL for tasks assessed/performed             Past Medical History:  Diagnosis Date   Acute CVA (cerebrovascular accident) (HCC) 09/09/2020   Chronic anticoagulation 11/14/2014   CKD (chronic kidney disease) stage 3, GFR 30-59 ml/min (HCC) 10/24/2019   CKD (chronic kidney disease) stage 3, GFR 30-59 ml/min (HCC) 10/24/2019   Clotting disorder (HCC)    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 two simple tubular adenomas removed. patient was advised to go to [facility] for colonoscopy but he did not keep appt.   IR CT HEAD LTD  02/02/2021   IR PERCUTANEOUS ART THROMBECTOMY/INFUSION INTRACRANIAL INC DIAG ANGIO  02/02/2021   RADIOLOGY WITH ANESTHESIA N/A 02/02/2021   Procedure: IR WITH ANESTHESIA;  Surgeon: Deveshwar, Sanjeev, MD;  Location: [facility] OR;  Service: Radiology;  Laterality: N/A;   TEE WITHOUT CARDIOVERSION N/A 09/10/2020   Procedure: TRANSESOPHAGEAL ECHOCARDIOGRAM (TEE) WITH PROPOFOL;  Surgeon: McDowell, Samuel G, MD;  Location: [facility];  Service: Cardiovascular;  Laterality: N/A;    There were no vitals filed for this visit.   Subjective Assessment - 10/16/21 1347     Subjective He just received his AFO on Monday. He has been walking some with it but says it will take some getting used to. He says it is kind of heavy and takes some getting used to. He has been walking using quad cane at home, doing pretty good, no falls.    Pertinent History CVA 02/02/21    Limitations Walking;Standing;House hold activities    Currently in Pain? No/denies                  [facility] PT Assessment - 10/16/21 0001       Assessment   Medical Diagnosis S/P right MCA with left hemiparesis    Referring Provider (PT) Alysia Penna MD    Onset Date/Surgical Date 02/02/21    Prior Therapy Yes      Precautions   Precautions Fall      Restrictions   Weight Bearing Restrictions No      Home Environment   Living Environment Private residence    Living Arrangements Other relatives    Available Help at Discharge Family      Prior Function   Level of Independence Independent      Cognition   Overall Cognitive Status  Within Functional Limits for tasks assessed      AROM   AROM Assessment Site Hip;Knee    Right/Left Hip Left    Right/Left Knee Left      Strength   Left Hip Flexion 3/5    Left Knee Flexion 3/5    Left Knee Extension 3/5    Left Ankle Dorsiflexion 2-/5      Transfers   Sit to Stand 6: Modified independent (Device/Increase time)    Comments Cueing for Lt foot placement prior standing to improve weight bearing, cues to move toward front of seat for better leverage      Ambulation/Gait   Ambulation/Gait Yes    Ambulation/Gait Assistance 5: Supervision;6: Modified independent (Device/Increase time)    Ambulation Distance (Feet) 105 Feet    Assistive device Large base quad cane    Gait Pattern Step-to pattern;Decreased stride length    Ambulation Surface Level;Indoor    Gait Comments Improved ground clearance with AFO, requires verbal cues to increase stride and take larger steps with RT. Continues to neglect LLE and externally rotate foot in stance                           [facility] Adult PT Treatment/Exercise - 10/16/21 0001       Knee/Hip Exercises: Standing   Gait Training 105 feet in clinic with quad cane, cues for 2 step pattern and sequencing, 5 RT in // bars practicing 2 step pattern and increased RT step length      Knee/Hip Exercises: Seated   Sit to Sand 5 reps;with UE support                       PT Short Term Goals - 10/16/21 1421       PT SHORT TERM GOAL #1   Title Patient will be independent with initial HEP and self-management strategies to improve functional outcomes    Baseline Reports compliance but requires conitnued verbal cues for proper form    Time 4    Period Weeks    Status Partially Met    Target Date 09/30/21      PT SHORT TERM GOAL #2   Title Patient will be Min A with transfers and bed mobility    Time 4    Period Weeks    Status Achieved    Target Date 06/19/21      PT SHORT TERM GOAL #3   Title Decrease  risk for falls per score 45/56 Berg Balance test    Baseline Remains fall risk due to hemiparesis    Time 3    Period Weeks    Status Deferred    Target Date 09/30/21  PT Long Term Goals - 10/16/21 1422       PT LONG TERM GOAL #1   Title Patient will be Mod I with bed mobility and transfers    Baseline Mod independent bed mobility; set-up to modified indep. transfers    Time 8    Period Weeks    Status Achieved      PT LONG TERM GOAL #2   Title Patient will be able to ambulate >100 feet with LRAD for improved household mobility    Baseline Current 105 feet with large quad cane    Time 8    Period Weeks    Status Achieved      PT LONG TERM GOAL #3   Title Patient will have at least 4/5 MMT throughout LLE (except ankle DF) for improved functional mobility and stair/ step navigation.    Baseline Remains limited due to hemiparesis, no significant change in MMT since last assessment    Time 8    Period Weeks    Status Not Met                   Plan - 10/16/21 1425     Clinical Impression Statement Patient function similar to last assessment. Requires continued cueing for proper mechanics with transfers and gait. Ongoing cues required for sequencing and stride length with ambulation, but does have improved LLE ground clearance with newly donned AFO. Practiced 2 step gait pattern and educated patient on possible use of more supportive AD (ie hemi walker) for maximized safety and efficiency with ambulation. At this time patient will be DC from therapy due to max benefit reached. Encouraged patient to follow up with therapy service with any further questions or concerns.    Examination-Activity Limitations Caring for Others;Transfers;Locomotion Level;Toileting;Stand    Examination-Participation Restrictions Yard Work;Cleaning;Laundry;Community Activity    Stability/Clinical Decision Making Evolving/Moderate complexity    Rehab Potential Fair    PT Frequency  2x / week    PT Duration 4 weeks    PT Treatment/Interventions ADLs/Self Care Home Management;Aquatic Therapy;Biofeedback;Cryotherapy;Fluidtherapy;Contrast Bath;Electrical Stimulation;Therapeutic exercise;Orthotic Fit/Training;Patient/family education;Manual lymph drainage;Manual techniques;Dry needling;Energy conservation;Spinal Manipulations;Joint Manipulations;Splinting;Taping;Compression bandaging;Vasopneumatic Device;Therapeutic activities;Parrafin;Ultrasound;Traction;Functional mobility training;Neuromuscular re-education;Stair training;Gait training;Moist Heat;Iontophoresis 79m/ml Dexamethasone;DME Instruction;Balance training;Passive range of motion;Scar mobilization;Wheelchair mobility training;Visual/perceptual remediation/compensation    PT Next Visit Plan DC to HEP    PT Home Exercise Plan Eval: marches, LAQs, heel/toe raise; 7/1:  bridge and sidelying clam 7/12 heel slide, supine hip abduction; tall sitting with lumbar support 10/13 seated clam    Consulted and Agree with Plan of Care Patient             Patient will benefit from skilled therapeutic intervention in order to improve the following deficits and impairments:  Abnormal gait, Improper body mechanics, Impaired sensation, Decreased balance, Difficulty walking, Decreased activity tolerance, Decreased range of motion, Decreased strength, Hypomobility, Impaired tone, Decreased mobility, Decreased coordination  Visit Diagnosis: Other symptoms and signs involving the musculoskeletal system  Other lack of coordination  Difficulty in walking, not elsewhere classified  Muscle weakness (generalized)     Problem List Patient Active Problem List   Diagnosis Date Noted   BPH (benign prostatic hyperplasia) 09/24/2021   History of CVA with residual deficit 09/24/2021   Dysphagia, post-stroke    Spastic hemiplegia affecting nondominant side (HCC)    Hemiparesis affecting left side as late effect of [facility]    Thyroid  nodule 01/25/2021   Hypothyroidism 01/25/2021   H/O adenomatous polyp of colon 12/30/2020  Essential hypertension 11/28/2020   Mixed hyperlipidemia 11/28/2020   H/O medication noncompliance 09/07/2020   Nicotine dependence 12/05/2019   Chronic kidney disease, stage 3 unspecified (HCC) 10/24/2019   Prediabetes 10/24/2019   Tubular adenoma of colon 09/14/2017   Long term (current) use of anticoagulants 11/14/2014   Sickle cell trait (HCC) 08/29/2012   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    

## 2021-10-31 ENCOUNTER — Other Ambulatory Visit: Payer: Self-pay | Admitting: Internal Medicine

## 2021-10-31 DIAGNOSIS — D6851 Activated protein C resistance: Secondary | ICD-10-CM

## 2021-10-31 DIAGNOSIS — I10 Essential (primary) hypertension: Secondary | ICD-10-CM

## 2021-10-31 DIAGNOSIS — Z7901 Long term (current) use of anticoagulants: Secondary | ICD-10-CM

## 2021-10-31 DIAGNOSIS — Z8673 Personal history of transient ischemic attack (TIA), and cerebral infarction without residual deficits: Secondary | ICD-10-CM

## 2021-11-29 ENCOUNTER — Other Ambulatory Visit (INDEPENDENT_AMBULATORY_CARE_PROVIDER_SITE_OTHER): Payer: Self-pay | Admitting: Nurse Practitioner

## 2021-11-29 DIAGNOSIS — I10 Essential (primary) hypertension: Secondary | ICD-10-CM

## 2021-11-29 DIAGNOSIS — R7303 Prediabetes: Secondary | ICD-10-CM

## 2021-11-29 DIAGNOSIS — Z7901 Long term (current) use of anticoagulants: Secondary | ICD-10-CM

## 2021-11-29 DIAGNOSIS — Z1211 Encounter for screening for malignant neoplasm of colon: Secondary | ICD-10-CM

## 2021-11-29 DIAGNOSIS — N1831 Chronic kidney disease, stage 3a: Secondary | ICD-10-CM

## 2021-12-04 ENCOUNTER — Other Ambulatory Visit: Payer: Self-pay | Admitting: Internal Medicine

## 2021-12-04 DIAGNOSIS — Z7901 Long term (current) use of anticoagulants: Secondary | ICD-10-CM

## 2021-12-04 DIAGNOSIS — I10 Essential (primary) hypertension: Secondary | ICD-10-CM

## 2021-12-04 DIAGNOSIS — Z8673 Personal history of transient ischemic attack (TIA), and cerebral infarction without residual deficits: Secondary | ICD-10-CM

## 2021-12-04 DIAGNOSIS — D6851 Activated protein C resistance: Secondary | ICD-10-CM

## 2021-12-08 ENCOUNTER — Telehealth: Payer: Self-pay

## 2021-12-08 ENCOUNTER — Other Ambulatory Visit: Payer: Self-pay | Admitting: *Deleted

## 2021-12-08 DIAGNOSIS — D6851 Activated protein C resistance: Secondary | ICD-10-CM

## 2021-12-08 DIAGNOSIS — R7303 Prediabetes: Secondary | ICD-10-CM

## 2021-12-08 DIAGNOSIS — Z8673 Personal history of transient ischemic attack (TIA), and cerebral infarction without residual deficits: Secondary | ICD-10-CM

## 2021-12-08 DIAGNOSIS — Z7901 Long term (current) use of anticoagulants: Secondary | ICD-10-CM

## 2021-12-08 DIAGNOSIS — I10 Essential (primary) hypertension: Secondary | ICD-10-CM

## 2021-12-08 DIAGNOSIS — Z1211 Encounter for screening for malignant neoplasm of colon: Secondary | ICD-10-CM

## 2021-12-08 DIAGNOSIS — N1831 Chronic kidney disease, stage 3a: Secondary | ICD-10-CM

## 2021-12-08 MED ORDER — TAMSULOSIN HCL 0.4 MG PO CAPS: 0.4000 mg | ORAL_CAPSULE | Freq: Every day | ORAL | 0 refills | Status: DC

## 2021-12-08 MED ORDER — ATORVASTATIN CALCIUM 40 MG PO TABS
40.0000 mg | ORAL_TABLET | Freq: Every day | ORAL | 0 refills | Status: DC
Start: 2021-12-08 — End: 2022-03-09

## 2021-12-08 MED ORDER — LISINOPRIL 10 MG PO TABS: 10.0000 mg | ORAL_TABLET | Freq: Every day | ORAL | 1 refills | Status: DC

## 2021-12-08 NOTE — Telephone Encounter (Signed)
Patient called need med refills:  Tamsulosin 0.4mg     Atorvastatin 40 mg  Lisinopril 10 mg (Patient said they sent to 20 mg instead of 10 mg on the Lisinopril--patient requesting 10 mg)   Pharmacy: [facility]

## 2021-12-08 NOTE — Telephone Encounter (Signed)
Medication sent to pharmacy  

## 2021-12-11 ENCOUNTER — Other Ambulatory Visit: Payer: Self-pay | Admitting: *Deleted

## 2021-12-11 ENCOUNTER — Encounter: Payer: Commercial Managed Care - HMO | Admitting: Physical Medicine & Rehabilitation

## 2021-12-11 DIAGNOSIS — N1831 Chronic kidney disease, stage 3a: Secondary | ICD-10-CM

## 2021-12-11 DIAGNOSIS — Z1211 Encounter for screening for malignant neoplasm of colon: Secondary | ICD-10-CM

## 2021-12-11 MED ORDER — AMLODIPINE BESYLATE 5 MG PO TABS: 5.0000 mg | ORAL_TABLET | Freq: Every day | ORAL | 1 refills | Status: DC

## 2021-12-13 DIAGNOSIS — L8990 Pressure ulcer of unspecified site, unspecified stage: Secondary | ICD-10-CM | POA: Diagnosis not present

## 2021-12-13 DIAGNOSIS — I69391 Dysphagia following cerebral infarction: Secondary | ICD-10-CM | POA: Diagnosis not present

## 2021-12-13 DIAGNOSIS — R350 Frequency of micturition: Secondary | ICD-10-CM | POA: Diagnosis not present

## 2021-12-13 DIAGNOSIS — I63511 Cerebral infarction due to unspecified occlusion or stenosis of right middle cerebral artery: Secondary | ICD-10-CM | POA: Diagnosis not present

## 2021-12-13 DIAGNOSIS — I952 Hypotension due to drugs: Secondary | ICD-10-CM | POA: Diagnosis not present

## 2021-12-17 ENCOUNTER — Encounter: Payer: Self-pay | Admitting: Adult Health

## 2021-12-17 ENCOUNTER — Ambulatory Visit: Payer: Commercial Managed Care - HMO | Admitting: Adult Health

## 2021-12-17 VITALS — BP 118/64 | HR 60

## 2021-12-17 DIAGNOSIS — R569 Unspecified convulsions: Secondary | ICD-10-CM | POA: Diagnosis not present

## 2021-12-17 DIAGNOSIS — Z7901 Long term (current) use of anticoagulants: Secondary | ICD-10-CM

## 2021-12-17 DIAGNOSIS — Z9189 Other specified personal risk factors, not elsewhere classified: Secondary | ICD-10-CM

## 2021-12-17 DIAGNOSIS — I639 Cerebral infarction, unspecified: Secondary | ICD-10-CM

## 2021-12-17 DIAGNOSIS — I63411 Cerebral infarction due to embolism of right middle cerebral artery: Secondary | ICD-10-CM | POA: Diagnosis not present

## 2021-12-17 MED ORDER — LEVETIRACETAM 500 MG PO TABS: 500.0000 mg | ORAL_TABLET | Freq: Two times a day (BID) | ORAL | 3 refills | Status: DC

## 2021-12-17 NOTE — Progress Notes (Signed)
[facility] [address] [place]. [place] [address] [phone]       OFFICE FOLLOW UP NOTE  Matthew. Matthew Wise Date of Birth:  1961-04-11 Medical Record Number:  [id number]   Primary neurologist: Dr. Leonie Man Referring MD:  Marlowe Shores, PA-c  Reason for Referral:  Stroke  Chief Complaint  Patient presents with   Follow-up    Rm 3 with son reports he has been doing well.       HPI:   Update, 12/17/2021, JM: Matthew Wise is here today for a stroke follow-up accompanied by his son. Overall stable without new stroke/TIA symptoms. 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  Ambulates short distance with cane and AFO brace, w/c for long distance. No recent falls.  Denies residual visual impairment. Completed therapy back in November as max rehab potential met per note review - he does question possibly returning. Considering pursuing botox with Dr. Letta Pate - has f/u next month to further discuss.  He lives with his sister and she helps with his ADLs. Denies any seizure activity on Keppra 500 mg twice daily, denies side effects.  Remains on Atorvastatin, Aspirin, and Pradaxa without side effects. BP today 118/64. Labs A1c 5.9 (02/03/2021), LDL 57 (09/24/2021).  No further concerns at this time   History provided for reference purposes only Initial visit 06/09/2021 Dr Leonie Man: Matthew Wise is a 61 year old African-American male seen today for initial office consultation visit for stroke.  He is accompanied by his son today.  History is obtained from them and review of electronic medical records and I personally reviewed pertinent available imaging films in PACS.  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  He presented on 02/02/2021 with sudden onset of left  facial droop and left hemiparesis.  He was seen by telemetry neurology and CT angiogram showed right MCA M1 filling defect and perfusion deficit hence was transferred to [facility] for consideration for endovascular treatment.  CT perfusion showed 57 cc of penumbra Patient was taken for emergent thrombectomy with revascularization of occluded right MCA M1 segment with 2 passes of Solitaire stent retriever device and aspiration resulting in TICI 2 c revascularization.  He was kept in the ICU and blood pressure strictly monitored.  MRI scan showed acute large right MCA infarct with petechial hemorrhage with minimum leftward shift there were also small acute infarcts noted in the left cerebellum and left occipital lobe as well as left parietal and left frontal lobes but does not clear whether these were periprocedural.  2D echo done previously on 01/22/2021 had shown left ventricular ejection fraction of 55 to 60% with no significant regional wall motion abnormalities.  There was  left atrial dilatation noted.  LDL cholesterol 25 mg percent and hemoglobin A1c was 5.9.  Patient had previous history of DVT and pulmonary embolism and lower extremity venous ultrasound showed age-indeterminate determinate left femoral and popliteal thrombus.  Patient had been on Eliquis prior to this admission and it was switched to Pradaxa 150 twice daily and as daily was also continued.  Patient had the seizure-like activity noted during the hospitalization and even though EEG had no correlation there is twitchings were thought to be focal motor seizures which are difficult to capture an EEG and patient is started on Keppra.  Patient had a recent previous admission in February 2022 and  MRI had shown right posterior temporal and parietal infarct in the inferior division MCA territory as well as acute infarction left cerebellum and right pontine tegmentum.  Previously TCD bubble study on 12/02/2020 was positive with delayed hits noted after  Valsalva.  He had previously had a 30-day heart monitor in October 2021 which had not shown any significant atrial fibrillation has not arrhythmias.  Loop recorder was not placed since patient was already a candidate for long-term anticoagulation due to history of DVT and pulmonary embolism.    Patient was transferred to inpatient rehab where he made gradual improvement and is currently living at home.  He is getting outpatient therapy now and has finished home therapies.  Is able to walk with a walker with somebody has to be close to him.  He still has significant residual left hemiparesis he can barely extend his left elbow and move his left hip but continues to have left foot drop.  He is tolerating Pradaxa and aspirin without bruising or bleeding.  Blood pressure is under good control in fact recently tends to run low and today it is 103/70.  Is tolerating Lipitor well without muscle aches and pains.  He has had no new recurrent stroke symptoms and has no new complaints today.      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/2021   Prediabetes 10/24/2019   Pulmonary embolism ([place]) 06/15/2011   Right  middle cerebral artery stroke ([place]) 02/12/2021   Thyroid nodule 01/25/2021   Transient ischemic attack 09/07/2020    Social History:  Social History   Socioeconomic History   Marital status: Divorced    Spouse name: Not on file   Number of children: 2   Years of education: 12   Highest education level: Not on file  Occupational History   Occupation: disability    Comment: DVT  Tobacco Use   Smoking status: Former    Packs/day: 1.00    Years: 30.00    Pack years: 30.00    Types: Cigarettes    Start date: 12/01/1983   Smokeless tobacco: Never  Vaping Use   Vaping Use: Never used  Substance and Sexual Activity   Alcohol use: Not Currently    Alcohol/week: 3.0 standard drinks    Types: 3 Cans of beer per week    Comment: has not drank sice 11/27/20   Drug use: Not Currently    Comment: REMOTE HISTORY, Quit in 2007   Sexual activity: Yes  Other Topics Concern   Not on file  Social History Narrative   Lives with sister   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   Right Handed  Drinks caffeine on [employer]   Social Determinants of Health   Financial Resource Strain: Low Risk    Difficulty of Paying Living Expenses: Not hard at all  Food Insecurity: No Food Insecurity   Worried About Charity fundraiser in the Last Year: Never true   Arboriculturist in the Last Year: Never true  Transportation Needs: No Transportation Needs   Lack of Transportation (Medical): No   Lack of Transportation (Non-Medical): No  Physical Activity: Insufficiently Active   Days of Exercise per Week: 5 days   Minutes of Exercise per Session: 20 min  Stress: No Stress Concern Present   Feeling of Stress : Only a little  Social Connections: Moderately Isolated   Frequency of Communication with Friends and Family: More than three times a week   Frequency of Social Gatherings with Friends and Family: More than three times a week   Attends Religious Services: 1 to 4 times per year   Active Member  of [employer] or Organizations: No   Attends Archivist Meetings: Never   Marital Status: Divorced  Human resources officer Violence: Not At Risk   Fear of Current or Ex-Partner: No   Emotionally Abused: No   Physically Abused: No   Sexually Abused: No    Medications:   Current Outpatient Medications on File Prior to Visit  Medication Sig Dispense Refill   acetaminophen (TYLENOL) 325 MG tablet Take 2 tablets (650 mg total) by mouth every 4 (four) hours as needed for mild pain (or temp > 37.5 C (99.5 F)).     amLODipine (NORVASC) 5 MG tablet Take 1 tablet (5 mg total) by mouth daily. 90 tablet 1   aspirin EC 81 MG EC tablet Take 1 tablet (81 mg total) by mouth daily. Swallow whole. 30 tablet 11   atorvastatin (LIPITOR) 40 MG tablet Take 1 tablet (40 mg total) by mouth daily. 90 tablet 0   levETIRAcetam (KEPPRA) 500 MG tablet TAKE 1 TABLET BY MOUTH TWICE A DAY 180 tablet 1   levothyroxine (SYNTHROID) 25 MCG tablet TAKE 1 TABLET BY MOUTH EVERY DAY 90 tablet 0   lisinopril (ZESTRIL) 10 MG tablet Take 1 tablet (10 mg total) by mouth daily. 90 tablet 1   pantoprazole (PROTONIX) 40 MG tablet TAKE 1 TABLET BY MOUTH EVERYDAY AT BEDTIME 90 tablet 1   PRADAXA 150 MG CAPS capsule TAKE 1 CAPSULE BY MOUTH EVERY 12 HOURS 60 capsule 0   senna-docusate (SENOKOT-S) 8.6-50 MG tablet Take 1 tablet by mouth at bedtime.     tamsulosin (FLOMAX) 0.4 MG CAPS capsule Take 1 capsule (0.4 mg total) by mouth daily after supper. 90 capsule 0   No current facility-administered medications on file prior to visit.    Allergies:  No Known Allergies  Physical Exam Today's Vitals   12/17/21 1258  BP: 118/64  Pulse: 60  SpO2: 99%   There is no height or weight on file to calculate BMI.   General: Mildly obese very pleasant middle-aged African-American male, seated, in no evident distress Head: head normocephalic and atraumatic.   Neck: supple with no carotid or supraclavicular bruits Cardiovascular: regular rate  and rhythm, no murmurs Musculoskeletal: no deformity Skin:  no rash/petichiae Vascular:  Normal pulses all extremities  Neurologic Exam Mental Status: Awake and fully alert. Oriented to place and time. Recent and remote memory intact. Attention span, concentration and fund of knowledge appropriate. Mood and affect appropriate.  Cranial Nerves: Pupils equal, briskly reactive to light.  Extraocular movements full without nystagmus. Visual fields full to confrontation testing. Hearing intact. Facial sensation intact. Left lower facial weakness. Tongue and palate move normally and symmetrically.  Motor: Spastic left hemiplegia with grade 1/5 strength in the left upper and lower extremity.  Is able to extend his left elbow and slightly move left shoulder.  He can similarly do some hip abduction and adduction only.  Left foot drop present.  Tone is increased on the left with spasticity compared to the right.  Normal strength on right side. Sensory.:  Diminished left hemibody sensation to touch , pinprick , position and vibratory sensation.  Coordination: Rapid alternating movements normal on right side. Finger-to-nose and heel-to-shin performed accurately on right side. Gait and Station: Deferred as patient is sitting in a wheelchair and did not bring his walker  reflexes: 2+ and asymmetric and brisker on the left. Toes downgoing.      ASSESSMENT: 61 year old African-American male with recurrent right middle cerebral artery infarcts (10/2020, 12/2020 and 01/2021) treated with mechanical thrombectomy in March 2022 without significant clinical improvement with residual significant spastic left hemiplegia.  He also had symptomatic focal motor seizures which are stable on the current medication regimen of Keppra.  Vascular risk factors of hypertension, hyperlipidemia , hypercoagulable state with BLE DVT and PE, smoking, obesity, sleep apnea, marijuana use, intracranial stenosis and multiple prior  strokes   PLAN:   Ischemic stroke:  Residual deficit: Left spastic hemiparesis and gait impairment. Continue to follow with Dr. Letta Pate for possible start of botox for spasticity. Will defer benefit of additional therapy to PMR - as of right now, likely will be of little benefit as prior therapy notes stated max rehab potential met 09/2021 Continue Aspirin 17m daily, Pradaxa 1569mBID and atorvastatin for secondary stroke prevention.  medications to be managed by PCP or other specialties Discussed secondary stroke prevention measures and importance of close PCP follow up for aggressive stroke risk factor management. I have gone over the pathophysiology of stroke, warning signs and symptoms, risk factors and their management in some detail with instructions to go to the closest emergency room for symptoms of concern. HTN: BP goal <130/90. Stable on current regimen per PCP. HLD: LDL goal <70. Continue Atorvastatin 4080maily  Sleep apnea: Positive for sleep apnea during eval for Sleep Smart trial but not a candidate due to presence of central sleep apneas.  [facility]  Seizures:  Continue Keppra 500m75mD for seizure prophylaxis -refill provided Due to size of recent stroke and prior strokes, he remains at risk of seizures therefore should remain on keppra at this time which was discussed with both patient and son Discussed importance of avoiding seizure provoking triggers    Follow-up in 6 months or call earlier if needed   CC:  PateLindell Spar   I spent 36 minutes of face-to-face and non-face-to-face time with patient and son.  This included previsit chart review, lab review, study review, order entry, electronic health record documentation, patient and son education and discussion regarding history of prior strokes with significant residual deficits, secondary stroke prevention measures and aggressive stroke risk factor  management, sleep apnea and importance of further evaluation, seizure post stroke and ongoing use of medication and answered all other questions to patient and son satisfaction  JessFrann RiderNPNew York City Children'[facility] [address] [place] [id number] [phone]: This document was prepared with  digital dictation and possible smart Company secretary. Any transcriptional errors that result from this process are unintentional.

## 2021-12-17 NOTE — Patient Instructions (Signed)
Continue aspirin 81 mg daily and Pradaxa (dabigatran) twice a day  and atorvastatin  for secondary stroke prevention  Continue keppra 500mg  twice daily for seizure prevention  Continue to follow with Dr. Letta Pate as scheduled  Continue to follow up with PCP regarding cholesterol and blood pressure management  Maintain strict control of hypertension with blood pressure goal below 130/90 and cholesterol with LDL cholesterol (bad cholesterol) goal below 70 mg/dL.   Signs of a Stroke? Follow the BEFAST method:  Balance Watch for a sudden loss of balance, trouble with coordination or vertigo Eyes Is there a sudden loss of vision in one or both eyes? Or double vision?  Face: Ask the person to smile. Does one side of the face droop or is it numb?  Arms: Ask the person to raise both arms. Does one arm drift downward? Is there weakness or numbness of a leg? Speech: Ask the person to repeat a simple phrase. Does the speech sound slurred/strange? Is the person confused ? Time: If you observe any of these signs, call 911.     Followup in the future with me in 6 months or call earlier if needed       Thank you for coming to see [place] at [facility]. I hope we have been able to provide you high quality care today.  You may receive a patient satisfaction survey over the next few weeks. We would appreciate your feedback and comments so that we may continue to improve ourselves and the health of our patients.

## 2021-12-31 ENCOUNTER — Other Ambulatory Visit: Payer: Self-pay | Admitting: Internal Medicine

## 2021-12-31 DIAGNOSIS — D6851 Activated protein C resistance: Secondary | ICD-10-CM

## 2021-12-31 DIAGNOSIS — Z8673 Personal history of transient ischemic attack (TIA), and cerebral infarction without residual deficits: Secondary | ICD-10-CM

## 2021-12-31 DIAGNOSIS — Z7901 Long term (current) use of anticoagulants: Secondary | ICD-10-CM

## 2021-12-31 DIAGNOSIS — I10 Essential (primary) hypertension: Secondary | ICD-10-CM

## 2022-01-05 ENCOUNTER — Ambulatory Visit (INDEPENDENT_AMBULATORY_CARE_PROVIDER_SITE_OTHER): Payer: Medicare Other | Admitting: Internal Medicine

## 2022-01-05 ENCOUNTER — Other Ambulatory Visit: Payer: Self-pay

## 2022-01-05 ENCOUNTER — Encounter: Payer: Self-pay | Admitting: Internal Medicine

## 2022-01-05 VITALS — BP 108/62 | HR 99 | Resp 18 | Ht 72.0 in | Wt 233.0 lb

## 2022-01-05 DIAGNOSIS — I10 Essential (primary) hypertension: Secondary | ICD-10-CM

## 2022-01-05 DIAGNOSIS — E039 Hypothyroidism, unspecified: Secondary | ICD-10-CM | POA: Diagnosis not present

## 2022-01-05 DIAGNOSIS — N1831 Chronic kidney disease, stage 3a: Secondary | ICD-10-CM | POA: Diagnosis not present

## 2022-01-05 DIAGNOSIS — G8110 Spastic hemiplegia affecting unspecified side: Secondary | ICD-10-CM

## 2022-01-05 DIAGNOSIS — Z7901 Long term (current) use of anticoagulants: Secondary | ICD-10-CM

## 2022-01-05 DIAGNOSIS — Z2821 Immunization not carried out because of patient refusal: Secondary | ICD-10-CM

## 2022-01-05 NOTE — Progress Notes (Signed)
Established Patient Office Visit  Subjective:  Patient ID: Matthew Wise, male    DOB: 11-22-1961  Age: 61 y.o. MRN: [id number]  CC:  Chief Complaint  Patient presents with   Follow-up    4 month follow up     HPI Matthew Wise is a 61 y.o. 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  HTN: BP is well-controlled. Takes medications regularly. Patient denies headache, dizziness, chest pain, dyspnea or palpitations.   He has a history of CKD stage III, for which she used to follow-up with Dr. Theador Hawthorne, but has not seen him recently.  He denies any dysuria or hematuria.  He takes Flomax for BPH.  History of CVA: Chart review suggests history of cryptogenic stroke s/p thrombectomy.  He follows up with PMNR and neurology for it.  He is on aspirin, statin and Pradaxa currently.  He also takes Keppra for seizure ppx.  He is wheelchair-bound currently.  Planned to get Botox for spastic hemiplegia.    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 two simple tubular adenomas removed. patient was advised to go to [facility] for colonoscopy but he did not keep appt.   IR CT HEAD LTD  02/02/2021   IR PERCUTANEOUS ART THROMBECTOMY/INFUSION INTRACRANIAL INC DIAG ANGIO  02/02/2021   RADIOLOGY WITH ANESTHESIA N/A 02/02/2021   Procedure: IR WITH ANESTHESIA;  Surgeon: Luanne Bras, MD;  Location: [place];  Service: Radiology;  Laterality: N/A;   TEE WITHOUT CARDIOVERSION N/A 09/10/2020   Procedure: TRANSESOPHAGEAL ECHOCARDIOGRAM (TEE) WITH PROPOFOL;  Surgeon: Satira Sark, MD;  Location: [facility];  Service: Cardiovascular;  Laterality: N/A;    Family History  Problem Relation Age of Onset   COPD Father    Stroke Brother    Pulmonary embolism Sister    Throat cancer Brother    Arthritis Mother    Diabetes Mother    Stroke Mother    Hypertension Brother    Diabetes Brother    Arthritis Brother    Diabetes Sister    Colon cancer Neg Hx     Social History   Socioeconomic History   Marital status: Divorced    Spouse name: Not on file   Number of children: 2   Years of education: 12   Highest education level: Not on file  Occupational History   Occupation: disability    Comment: DVT  Tobacco Use   Smoking status: Former    Packs/day: 1.00    Years: 30.00    Pack years: 30.00    Types: Cigarettes    Start date: 12/01/1983   Smokeless tobacco: Never  Vaping Use   Vaping Use: Never used  Substance and Sexual Activity   Alcohol use: Not Currently    Alcohol/week: 3.0 standard drinks    Types: 3 Cans of beer per week    Comment: has not drank sice 11/27/20   Drug use: Not Currently    Comment: REMOTE HISTORY, Quit in 2007   Sexual activity: Yes  Other Topics Concern   Not on file   Social History Narrative   Lives with sister   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   Right Handed    Drinks caffeine on occassion   Social Determinants of Health   Financial Resource Strain: Low Risk    Difficulty of Paying Living Expenses: Not hard at all  Food Insecurity: No Food Insecurity   Worried About Charity fundraiser in the Last Year: Never true   Arboriculturist in the Last Year: Never true  Transportation Needs: No Transportation Needs   Lack of Transportation (Medical): No   Lack of Transportation (Non-Medical): No  Physical Activity: Insufficiently Active   Days of Exercise per Week: 5 days   Minutes of Exercise per Session: 20 min  Stress: No Stress Concern Present   Feeling of Stress : Only a little  Social Connections: Moderately Isolated   Frequency of Communication with Friends and Family: More than three times a week   Frequency of Social Gatherings with Friends and Family: More than three times a week   Attends Religious Services: 1 to 4 times per year   Active Member of [employer] or Organizations: No   Attends Archivist Meetings: Never   Marital Status: Divorced  Human resources officer Violence: Not At Risk   Fear of Current or Ex-Partner: No   Emotionally Abused: No   Physically Abused: No   Sexually Abused: No    Outpatient Medications Prior to Visit  Medication Sig Dispense Refill   acetaminophen (TYLENOL) 325 MG tablet Take 2 tablets (650 mg total) by mouth every 4 (four) hours as needed for mild pain (or temp > 37.5 C (99.5 F)).     amLODipine (NORVASC) 5 MG tablet Take 1 tablet (5 mg total) by mouth daily. 90 tablet 1   aspirin EC 81 MG EC tablet Take 1 tablet (81 mg total) by mouth daily. Swallow whole. 30 tablet 11   atorvastatin (LIPITOR) 40 MG tablet Take 1 tablet (40 mg total) by mouth daily. 90 tablet 0   levETIRAcetam (KEPPRA) 500 MG tablet Take 1 tablet (500 mg total) by mouth 2 (two) times daily. 180 tablet 3   levothyroxine  (SYNTHROID) 25 MCG tablet TAKE 1 TABLET BY MOUTH EVERY DAY 90 tablet 0   lisinopril (ZESTRIL) 10 MG tablet Take 1 tablet (10 mg total) by mouth daily. 90 tablet 1   pantoprazole (PROTONIX) 40 MG tablet TAKE 1 TABLET BY MOUTH EVERYDAY AT BEDTIME 90 tablet 1   PRADAXA 150 MG CAPS capsule TAKE 1 CAPSULE BY MOUTH EVERY 12 HOURS 60 capsule 5   senna-docusate (SENOKOT-S) 8.6-50 MG tablet Take 1 tablet by mouth at bedtime.     tamsulosin (FLOMAX) 0.4 MG CAPS capsule Take 1 capsule (0.4 mg  total) by mouth daily after supper. 90 capsule 0   No facility-administered medications prior to visit.    No Known Allergies  ROS Review of Systems  Constitutional:  Positive for fatigue. Negative for chills and fever.  HENT:  Negative for congestion and sore throat.   Eyes:  Negative for pain and discharge.  Respiratory:  Negative for cough and shortness of breath.   Cardiovascular:  Negative for chest pain and palpitations.  Gastrointestinal:  Positive for constipation. Negative for diarrhea, nausea and vomiting.  Endocrine: Negative for polydipsia and polyuria.  Genitourinary:  Negative for dysuria and hematuria.  Musculoskeletal:  Positive for arthralgias and neck stiffness. Negative for neck pain.  Skin:  Negative for rash.  Neurological:  Positive for weakness and numbness. Negative for dizziness and headaches.  Psychiatric/Behavioral:  Negative for agitation and behavioral problems.      Objective:    Physical Exam Vitals reviewed.  Constitutional:      General: He is not in acute distress.    Appearance: He is not diaphoretic.     Comments: In wheelchair  HENT:     Head: Normocephalic and atraumatic.     Nose: Nose normal.     Mouth/Throat:     Mouth: Mucous membranes are moist.  Eyes:     General: No scleral icterus.    Extraocular Movements: Extraocular movements intact.  Cardiovascular:     Rate and Rhythm: Normal rate and regular rhythm.     Pulses: Normal pulses.     Heart  sounds: Normal heart sounds. No murmur heard. Pulmonary:     Breath sounds: Normal breath sounds. No wheezing or rales.  Musculoskeletal:     Cervical back: Neck supple. No tenderness.     Right lower leg: No edema.     Left lower leg: No edema.  Skin:    General: Skin is warm.     Findings: No rash.  Neurological:     General: No focal deficit present.     Mental Status: He is alert and oriented to person, place, and time.     Sensory: Sensory deficit (Left sided hemiparesis) present.     Motor: Weakness (Left UE and LE 1/5) present.  Psychiatric:        Mood and Affect: Mood normal.        Behavior: Behavior normal.    BP 108/62 (BP Location: Right Arm, Patient Position: Sitting, Cuff Size: Normal)    Pulse 99    Resp 18    Ht 6' (1.829 m)    Wt 233 lb (105.7 kg)    SpO2 95%    BMI 31.60 kg/m  Wt Readings from Last 3 Encounters:  01/05/22 233 lb (105.7 kg)  10/07/21 234 lb (106.1 kg)  09/24/21 234 lb (106.1 kg)    Lab Results  Component Value Date   TSH 2.600 09/24/2021   Lab Results  Component Value Date   WBC 3.5 09/24/2021   HGB 12.6 (L) 09/24/2021   HCT 36.8 (L) 09/24/2021   MCV 81 09/24/2021   PLT 179 09/24/2021   Lab Results  Component Value Date   NA 142 09/24/2021   K 4.0 09/24/2021   CO2 27 09/24/2021   GLUCOSE 84 09/24/2021   BUN 18 09/24/2021   CREATININE 1.56 (H) 09/24/2021   BILITOT 1.0 09/24/2021   ALKPHOS 127 (H) 09/24/2021   AST 23 09/24/2021   ALT 28 09/24/2021   PROT 7.1 09/24/2021   ALBUMIN 4.4 09/24/2021  CALCIUM 9.6 09/24/2021   ANIONGAP 6 03/10/2021   EGFR 51 (L) 09/24/2021   Lab Results  Component Value Date   CHOL 119 09/24/2021   Lab Results  Component Value Date   HDL 48 09/24/2021   Lab Results  Component Value Date   LDLCALC 57 09/24/2021   Lab Results  Component Value Date   TRIG 63 09/24/2021   Lab Results  Component Value Date   CHOLHDL 2.5 09/24/2021   Lab Results  Component Value Date   HGBA1C 5.9 (H)  02/03/2021      Assessment & Plan:   Problem List Items Addressed This Visit       Cardiovascular and Mediastinum   Essential hypertension - Primary    BP Readings from Last 1 Encounters:  01/05/22 108/62  Well-controlled with lisinopril and amlodipine Counseled for compliance with the medications Advised DASH diet        Endocrine   Hypothyroidism    Lab Results  Component Value Date   TSH 2.600 09/24/2021  On Levothyroxine 25 mcg QD        Nervous and Auditory   Spastic hemiplegia affecting nondominant side ([place])    CVA in 10/2020 and 01/2021, s/p thrombectomy Has residual left-sided hemiplegia and hemiparesis Wheelchair-bound On aspirin, statin and Pradaxa 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 Has not seen cardiology in outpatient setting        Genitourinary   Chronic kidney disease, stage 3 unspecified ([place])    Used to follow-up with Dr. Theador Hawthorne, needs to schedule appointment On ACEi Avoid nephrotoxic agents Maintain adequate hydration Check CMP today      Relevant Orders   CMP14+EGFR     Other   Long term (current) use of anticoagulants    On Pradaxa due to history of cryptogenic stroke, DVT and PE      Relevant Orders   CBC with Differential/Platelet   Other Visit Diagnoses     Influenza vaccine refused           No orders of the defined types were placed in this encounter.   Follow-up: Return in about 6 months (around 07/05/2022) for Annual physical.    Lindell Spar, MD

## 2022-01-05 NOTE — Assessment & Plan Note (Signed)
On Pradaxa due to history of cryptogenic stroke, DVT and PE

## 2022-01-05 NOTE — Assessment & Plan Note (Signed)
BP Readings from Last 1 Encounters:  01/05/22 108/62   Well-controlled with lisinopril and amlodipine Counseled for compliance with the medications Advised DASH diet

## 2022-01-05 NOTE — Patient Instructions (Signed)
Please continue taking medications as prescribed.  Please continue to follow low salt diet.

## 2022-01-05 NOTE — Assessment & Plan Note (Signed)
CVA in 10/2020 and 01/2021, s/p thrombectomy Has residual left-sided hemiplegia and hemiparesis Wheelchair-bound On aspirin, statin and Pradaxa 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 Has not seen cardiology in outpatient setting

## 2022-01-05 NOTE — Assessment & Plan Note (Signed)
Used to follow-up with Dr. Theador Hawthorne, needs to schedule appointment On ACEi Avoid nephrotoxic agents Maintain adequate hydration Check CMP today

## 2022-01-05 NOTE — Assessment & Plan Note (Signed)
Lab Results  Component Value Date   TSH 2.600 09/24/2021   On Levothyroxine 25 mcg QD

## 2022-01-06 LAB — CMP14+EGFR
ALT: 50 IU/L — ABNORMAL HIGH (ref 0–44)
AST: 30 IU/L (ref 0–40)
Albumin/Globulin Ratio: 1.5 (ref 1.2–2.2)
Albumin: 4.3 g/dL (ref 3.8–4.9)
Alkaline Phosphatase: 151 IU/L — ABNORMAL HIGH (ref 44–121)
BUN/Creatinine Ratio: 9 (ref 9–20)
BUN: 13 mg/dL (ref 6–24)
Bilirubin Total: 0.8 mg/dL (ref 0.0–1.2)
CO2: 24 mmol/L (ref 20–29)
Calcium: 9.6 mg/dL (ref 8.7–10.2)
Chloride: 108 mmol/L — ABNORMAL HIGH (ref 96–106)
Creatinine, Ser: 1.42 mg/dL — ABNORMAL HIGH (ref 0.76–1.27)
Globulin, Total: 2.9 g/dL (ref 1.5–4.5)
Glucose: 98 mg/dL (ref 70–99)
Potassium: 4 mmol/L (ref 3.5–5.2)
Sodium: 147 mmol/L — ABNORMAL HIGH (ref 134–144)
Total Protein: 7.2 g/dL (ref 6.0–8.5)
eGFR: 57 mL/min/{1.73_m2} — ABNORMAL LOW (ref >59)

## 2022-01-06 LAB — CBC WITH DIFFERENTIAL/PLATELET
Basophils Absolute: 0 10*3/uL (ref 0.0–0.2)
Basos: 1 %
EOS (ABSOLUTE): 0.1 10*3/uL (ref 0.0–0.4)
Eos: 1 %
Hematocrit: 38.1 % (ref 37.5–51.0)
Hemoglobin: 12.8 g/dL — ABNORMAL LOW (ref 13.0–17.7)
Immature Grans (Abs): 0 10*3/uL (ref 0.0–0.1)
Immature Granulocytes: 0 %
Lymphocytes Absolute: 1.6 10*3/uL (ref 0.7–3.1)
Lymphs: 42 %
MCH: 27.3 pg (ref 26.6–33.0)
MCHC: 33.6 g/dL (ref 31.5–35.7)
MCV: 81 fL (ref 79–97)
Monocytes Absolute: 0.3 10*3/uL (ref 0.1–0.9)
Monocytes: 9 %
Neutrophils Absolute: 1.8 10*3/uL (ref 1.4–7.0)
Neutrophils: 47 %
Platelets: 182 10*3/uL (ref 150–450)
RBC: 4.69 x10E6/uL (ref 4.14–5.80)
RDW: 13.2 % (ref 11.6–15.4)
WBC: 3.8 10*3/uL (ref 3.4–10.8)

## 2022-01-15 ENCOUNTER — Other Ambulatory Visit: Payer: Self-pay | Admitting: Internal Medicine

## 2022-01-15 DIAGNOSIS — E041 Nontoxic single thyroid nodule: Secondary | ICD-10-CM

## 2022-01-15 DIAGNOSIS — E039 Hypothyroidism, unspecified: Secondary | ICD-10-CM

## 2022-01-21 ENCOUNTER — Ambulatory Visit (HOSPITAL_COMMUNITY): Payer: Medicare Other

## 2022-01-22 ENCOUNTER — Encounter: Payer: Self-pay | Admitting: Physical Medicine & Rehabilitation

## 2022-01-22 ENCOUNTER — Encounter: Payer: Medicare Other | Attending: Physical Medicine & Rehabilitation | Admitting: Physical Medicine & Rehabilitation

## 2022-01-22 ENCOUNTER — Other Ambulatory Visit: Payer: Self-pay

## 2022-01-22 VITALS — BP 115/77 | HR 58 | Ht 72.0 in | Wt 233.0 lb

## 2022-01-22 DIAGNOSIS — G8114 Spastic hemiplegia affecting left nondominant side: Secondary | ICD-10-CM

## 2022-01-22 DIAGNOSIS — G8110 Spastic hemiplegia affecting unspecified side: Secondary | ICD-10-CM | POA: Insufficient documentation

## 2022-01-22 NOTE — Patient Instructions (Signed)

## 2022-01-22 NOTE — Progress Notes (Signed)
Botox Injection for spasticity using needle EMG guidance  Dilution: 50 units/ml Indication: Severe spasticity which interferes with ADL,mobility and/or  hygiene and is unresponsive to medication management and other conservative care Informed consent was obtained after describing risks and benefits of the procedure with the patient. This includes bleeding, bruising, infection, excessive weakness, or medication side effects. A REMS form is on file and signed. Needle: 25-gauge 2 inch needle electrode Number of units per muscle Left  Pec 75 Biceps 75 Brachialis 75 FDS 25 FDP 25 FPL 25 All injections were done after obtaining appropriate EMG activity and after negative drawback for blood. The patient tolerated the procedure well. Post procedure instructions were given. A followup appointment was made.

## 2022-01-28 ENCOUNTER — Ambulatory Visit (HOSPITAL_COMMUNITY): Payer: Medicare Other

## 2022-02-09 ENCOUNTER — Ambulatory Visit (HOSPITAL_COMMUNITY)
Admission: RE | Admit: 2022-02-09 | Discharge: 2022-02-09 | Disposition: A | Discharge status: Home / Self Care | Payer: Medicare Other | Source: Ambulatory Visit | Attending: Internal Medicine | Admitting: Internal Medicine

## 2022-02-09 ENCOUNTER — Other Ambulatory Visit: Payer: Self-pay

## 2022-02-09 DIAGNOSIS — E041 Nontoxic single thyroid nodule: Secondary | ICD-10-CM

## 2022-02-09 DIAGNOSIS — E039 Hypothyroidism, unspecified: Secondary | ICD-10-CM

## 2022-02-09 IMAGING — US US THYROID
1 series · 13 of 25 positions shown · non-contrast
Comparison: US Thyroid, [DATE].  CT neck, [DATE].

CLINICAL DATA: Prior ultrasound follow-up.

EXAM:
THYROID ULTRASOUND
TECHNIQUE: Ultrasound examination of the thyroid gland and adjacent soft
tissues was performed.

[Series 1: us thyroid · 13 of 78 slices shown]
[im 1/78]
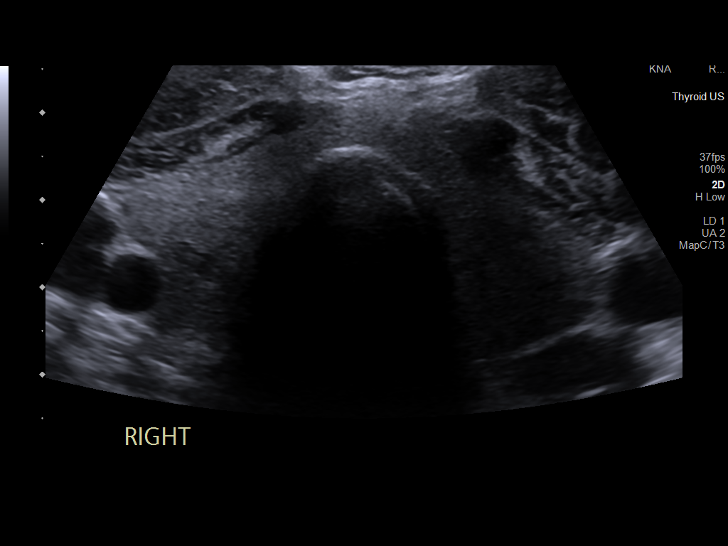
[im 7/78]
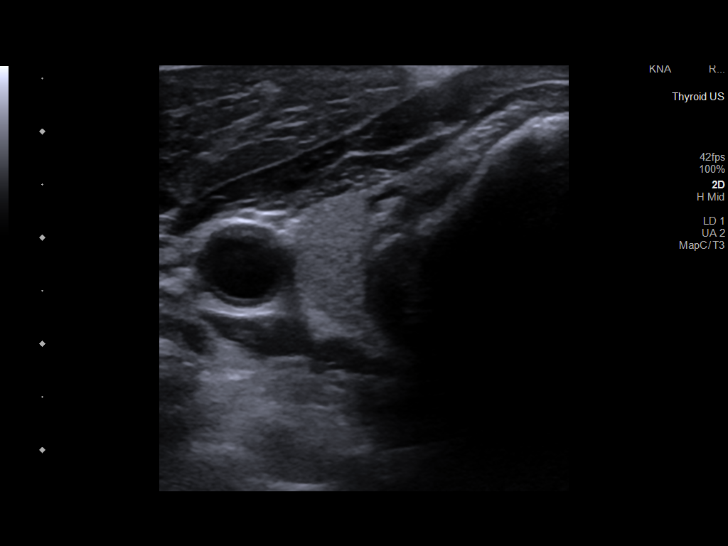
[im 13/78]
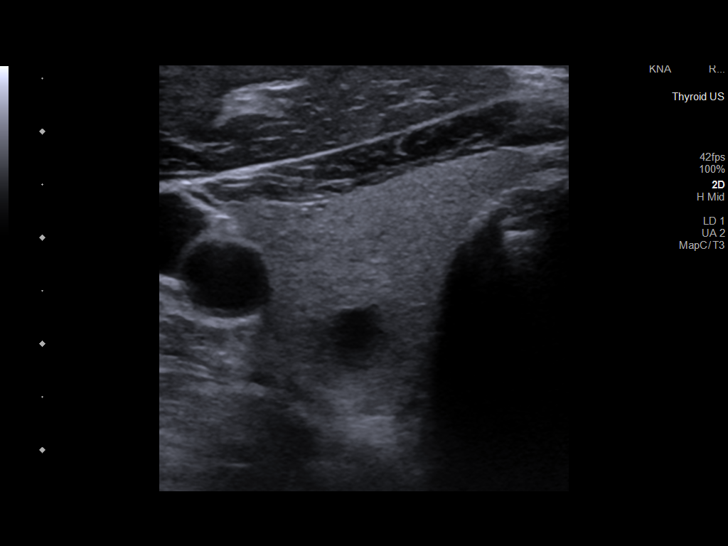
[im 20/78]
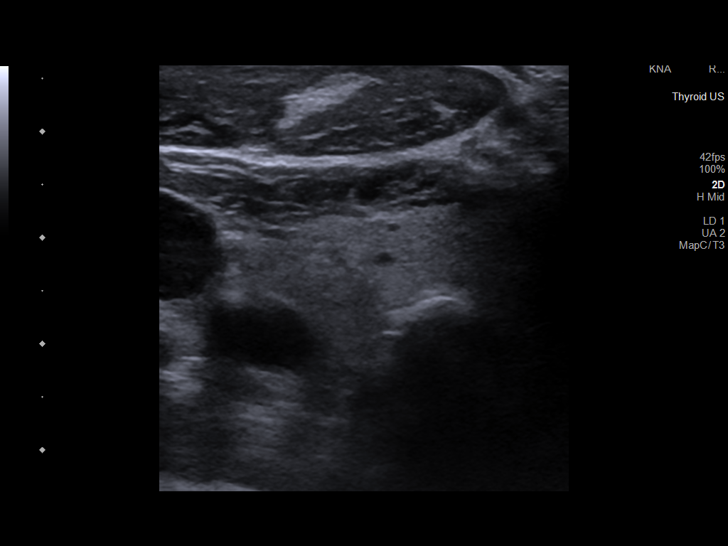
[im 26/78]
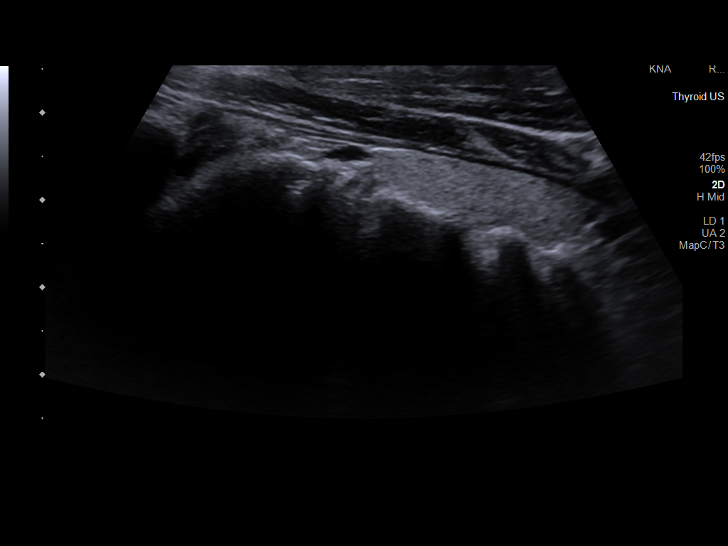
[im 33/78]
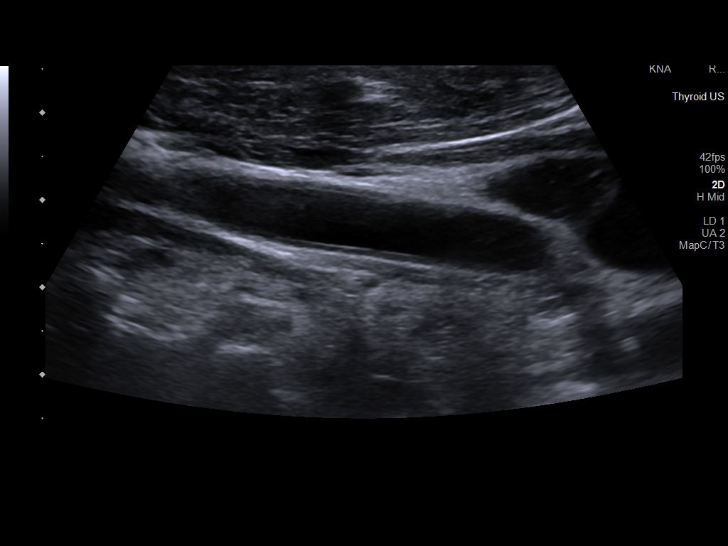
[im 39/78]
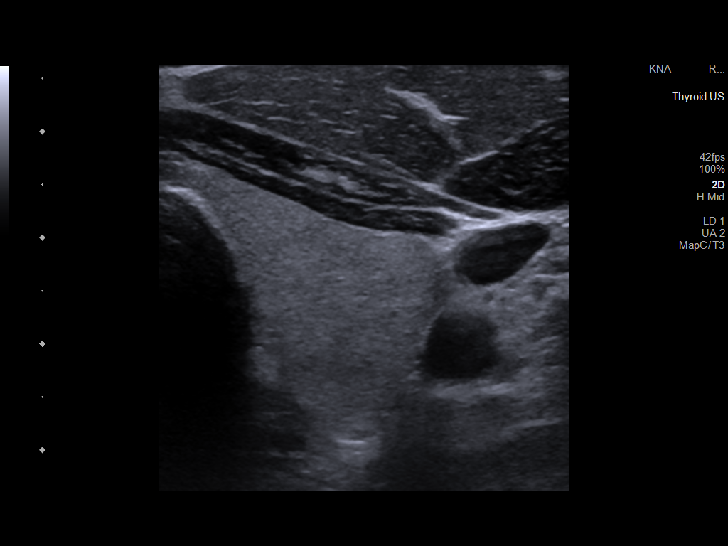
[im 45/78]
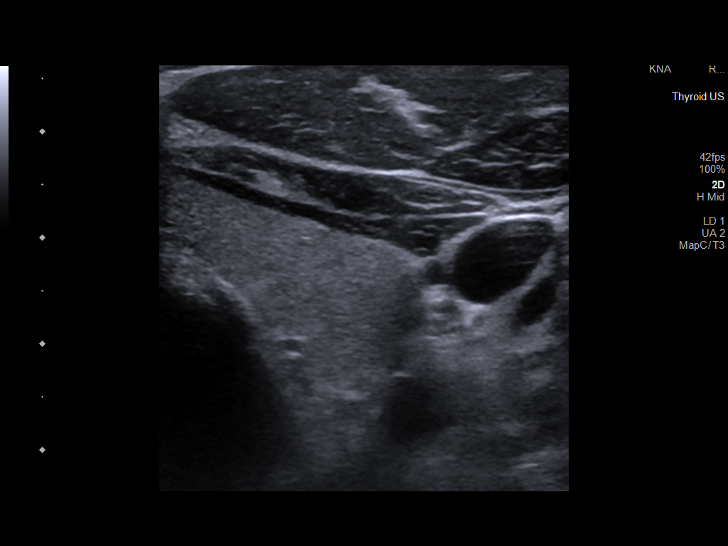
[im 52/78]
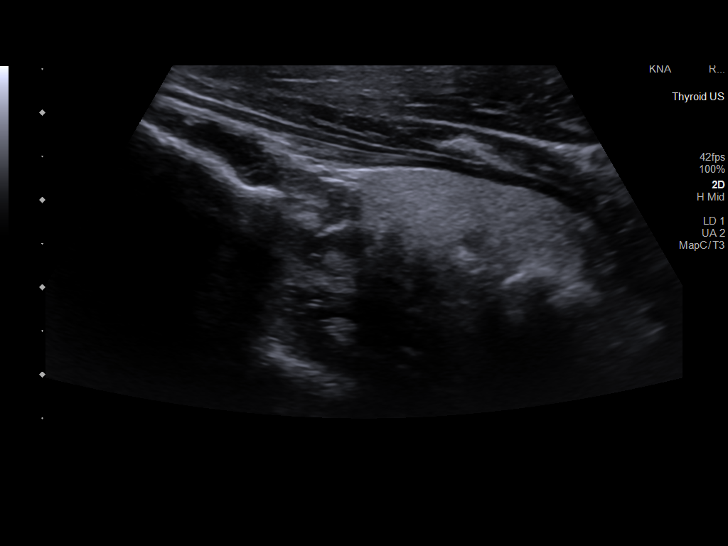
[im 58/78]
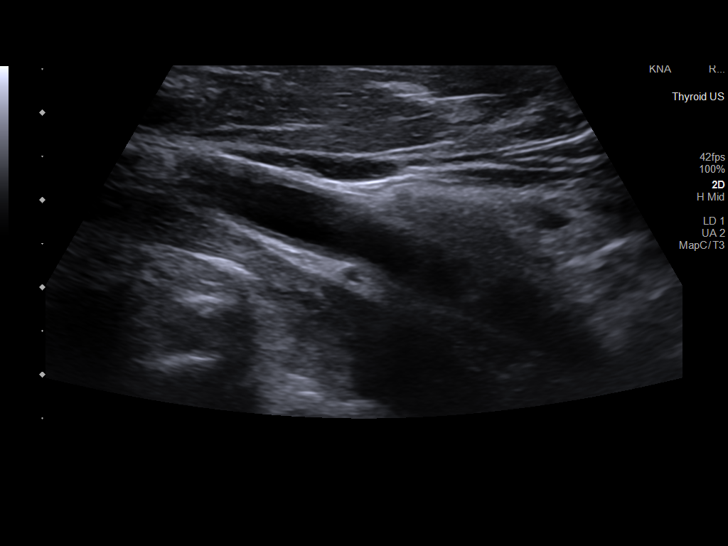
[im 65/78]
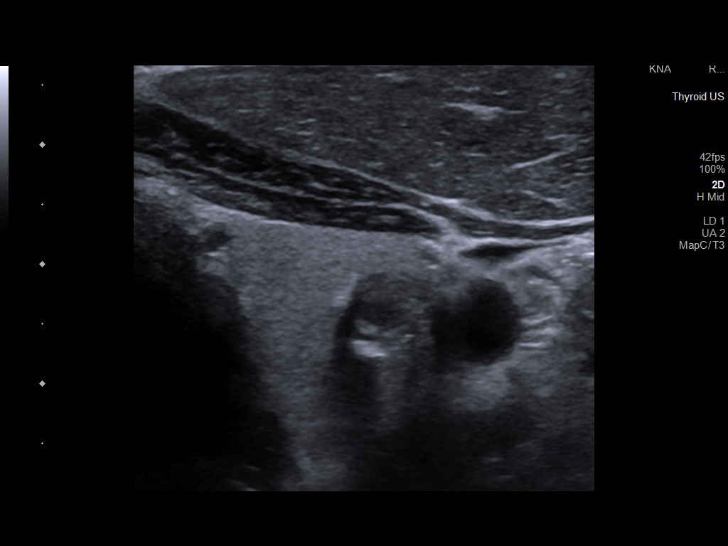
[im 71/78]
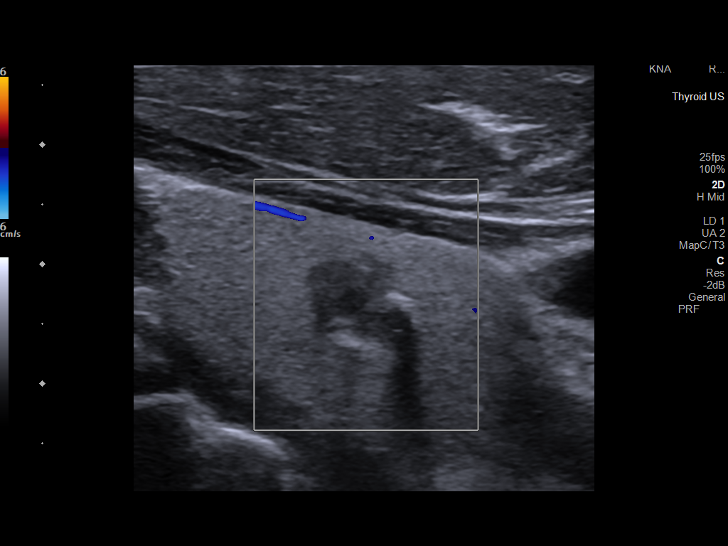
[im 78/78]
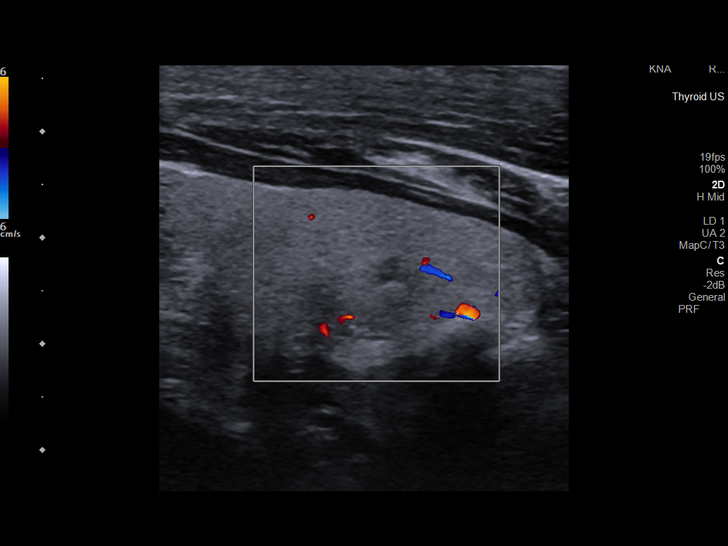

[13 of 25 positions shown; findings below may reference images not displayed]

FINDINGS: Parenchymal Echotexture: Mildly heterogenous

Isthmus: 0.4 cm

Right lobe: 5.1 x 1.9 x 1.7 cm

Left lobe: 4.5 x 1.7 x 1.9 cm

_________________________________________________________

Estimated total number of nodules >/= 1 cm: 2

Number of spongiform nodules >/=  2 cm not described below (TR1): 0

Number of mixed cystic and solid nodules >/= 1.5 cm not described
below (TR2): 0

_________________________________________________________

Nodule # 2:

Location: LEFT; Mid

Maximum size: 1.0 cm; Other 2 dimensions: 0.8 x 0.7 cm, previously
1.3 x 1.2 x 0.7 cm

Composition: solid/almost completely solid (2)

Echogenicity: hypoechoic (2)

Shape: not taller-than-wide (0)

Margins: ill-defined (0)

Echogenic foci: macrocalcifications (1)

ACR TI-RADS total points: 5.

ACR TI-RADS risk category: TR4 (4-6 points).

ACR TI-RADS recommendations:

*Given size (>/= 1 - 1.4 cm) and appearance, a follow-up ultrasound
in 1 year should be considered based on TI-RADS criteria.

_________________________________________________________

Additional 1.3 cm RIGHT mid cystic (TR-1) and [0O] cm LEFT mid
thyroid nodule do not meet threshold for follow-up nor biopsy per
current criteria.

No cervical adenopathy or abnormal fluid collection within the
imaged neck.
IMPRESSION: 1.0 cm LEFT mid TR-4 thyroid nodule.

A follow-up ultrasound in 1 year should be considered based on
TI-RADS criteria.

The above is in keeping with the ACR TI-RADS recommendations - [HOSPITAL] [0O];[DATE].

## 2022-02-11 ENCOUNTER — Ambulatory Visit (HOSPITAL_COMMUNITY): Payer: Medicare Other | Admitting: Physical Therapy

## 2022-02-11 ENCOUNTER — Ambulatory Visit (HOSPITAL_COMMUNITY): Payer: Medicare Other

## 2022-02-17 ENCOUNTER — Encounter (HOSPITAL_COMMUNITY): Payer: Self-pay | Admitting: Emergency Medicine

## 2022-02-17 ENCOUNTER — Emergency Department (HOSPITAL_COMMUNITY): Payer: Medicare Other

## 2022-02-17 ENCOUNTER — Emergency Department (HOSPITAL_COMMUNITY)
Admission: EM | Admit: 2022-02-17 | Discharge: 2022-02-18 | Disposition: A | Discharge status: Home / Self Care | Payer: Medicare Other | Attending: Emergency Medicine | Admitting: Emergency Medicine

## 2022-02-17 ENCOUNTER — Other Ambulatory Visit: Payer: Self-pay

## 2022-02-17 DIAGNOSIS — Z7901 Long term (current) use of anticoagulants: Secondary | ICD-10-CM | POA: Insufficient documentation

## 2022-02-17 DIAGNOSIS — R0902 Hypoxemia: Secondary | ICD-10-CM | POA: Diagnosis not present

## 2022-02-17 DIAGNOSIS — R531 Weakness: Secondary | ICD-10-CM

## 2022-02-17 DIAGNOSIS — I82220 Acute embolism and thrombosis of inferior vena cava: Secondary | ICD-10-CM | POA: Diagnosis not present

## 2022-02-17 DIAGNOSIS — Z79899 Other long term (current) drug therapy: Secondary | ICD-10-CM | POA: Diagnosis not present

## 2022-02-17 DIAGNOSIS — Z20822 Contact with and (suspected) exposure to covid-19: Secondary | ICD-10-CM | POA: Diagnosis not present

## 2022-02-17 DIAGNOSIS — E860 Dehydration: Secondary | ICD-10-CM | POA: Diagnosis not present

## 2022-02-17 DIAGNOSIS — R350 Frequency of micturition: Secondary | ICD-10-CM | POA: Diagnosis present

## 2022-02-17 DIAGNOSIS — Z7982 Long term (current) use of aspirin: Secondary | ICD-10-CM | POA: Diagnosis not present

## 2022-02-17 DIAGNOSIS — G8192 Hemiplegia, unspecified affecting left dominant side: Secondary | ICD-10-CM | POA: Insufficient documentation

## 2022-02-17 DIAGNOSIS — Z743 Need for continuous supervision: Secondary | ICD-10-CM | POA: Diagnosis not present

## 2022-02-17 DIAGNOSIS — N3000 Acute cystitis without hematuria: Secondary | ICD-10-CM | POA: Insufficient documentation

## 2022-02-17 LAB — CBC WITH DIFFERENTIAL/PLATELET
Abs Immature Granulocytes: 0.03 10*3/uL (ref 0.00–0.07)
Basophils Absolute: 0 10*3/uL (ref 0.0–0.1)
Basophils Relative: 0 %
Eosinophils Absolute: 0 10*3/uL (ref 0.0–0.5)
Eosinophils Relative: 0 %
HCT: 38.9 % — ABNORMAL LOW (ref 39.0–52.0)
Hemoglobin: 13.1 g/dL (ref 13.0–17.0)
Immature Granulocytes: 0 %
Lymphocytes Relative: 14 %
Lymphs Abs: 1.4 10*3/uL (ref 0.7–4.0)
MCH: 28.2 pg (ref 26.0–34.0)
MCHC: 33.7 g/dL (ref 30.0–36.0)
MCV: 83.7 fL (ref 80.0–100.0)
Monocytes Absolute: 0.9 10*3/uL (ref 0.1–1.0)
Monocytes Relative: 9 %
Neutro Abs: 7.7 10*3/uL (ref 1.7–7.7)
Neutrophils Relative %: 77 %
Platelets: 178 10*3/uL (ref 150–400)
RBC: 4.65 MIL/uL (ref 4.22–5.81)
RDW: 13.8 % (ref 11.5–15.5)
WBC: 10 10*3/uL (ref 4.0–10.5)
nRBC: 0 % (ref 0.0–0.2)

## 2022-02-17 LAB — COMPREHENSIVE METABOLIC PANEL
ALT: 41 U/L (ref 0–44)
AST: 23 U/L (ref 15–41)
Albumin: 4.1 g/dL (ref 3.5–5.0)
Alkaline Phosphatase: 123 U/L (ref 38–126)
Anion gap: 9 (ref 5–15)
BUN: 21 mg/dL — ABNORMAL HIGH (ref 6–20)
CO2: 25 mmol/L (ref 22–32)
Calcium: 9 mg/dL (ref 8.9–10.3)
Chloride: 103 mmol/L (ref 98–111)
Creatinine, Ser: 1.8 mg/dL — ABNORMAL HIGH (ref 0.61–1.24)
GFR, Estimated: 43 mL/min — ABNORMAL LOW (ref >60)
Glucose, Bld: 109 mg/dL — ABNORMAL HIGH (ref 70–99)
Potassium: 3.3 mmol/L — ABNORMAL LOW (ref 3.5–5.1)
Sodium: 137 mmol/L (ref 135–145)
Total Bilirubin: 1.3 mg/dL — ABNORMAL HIGH (ref 0.3–1.2)
Total Protein: 7.9 g/dL (ref 6.5–8.1)

## 2022-02-17 LAB — URINALYSIS, ROUTINE W REFLEX MICROSCOPIC
Bilirubin Urine: NEGATIVE
Glucose, UA: NEGATIVE mg/dL
Ketones, ur: NEGATIVE mg/dL
Leukocytes,Ua: NEGATIVE
Nitrite: NEGATIVE
Protein, ur: 100 mg/dL — AB
RBC / HPF: >50 RBC/hpf — ABNORMAL HIGH (ref 0–5)
Specific Gravity, Urine: 1.012 (ref 1.005–1.030)
pH: 5 (ref 5.0–8.0)

## 2022-02-17 LAB — LACTIC ACID, PLASMA: Lactic Acid, Venous: 1.2 mmol/L (ref 0.5–1.9)

## 2022-02-17 LAB — PROTIME-INR
INR: 1.4 — ABNORMAL HIGH (ref 0.8–1.2)
Prothrombin Time: 16.7 seconds — ABNORMAL HIGH (ref 11.4–15.2)

## 2022-02-17 LAB — APTT: aPTT: 44 seconds — ABNORMAL HIGH (ref 24–36)

## 2022-02-17 IMAGING — DX DG CHEST 1V PORT
1 series · 1 of 1 positions shown · non-contrast
Comparison: [DATE]

CLINICAL DATA: Questionable sepsis - evaluate for abnormality

Weakness.
EXAM:
PORTABLE CHEST 1 VIEW

[chest ap]
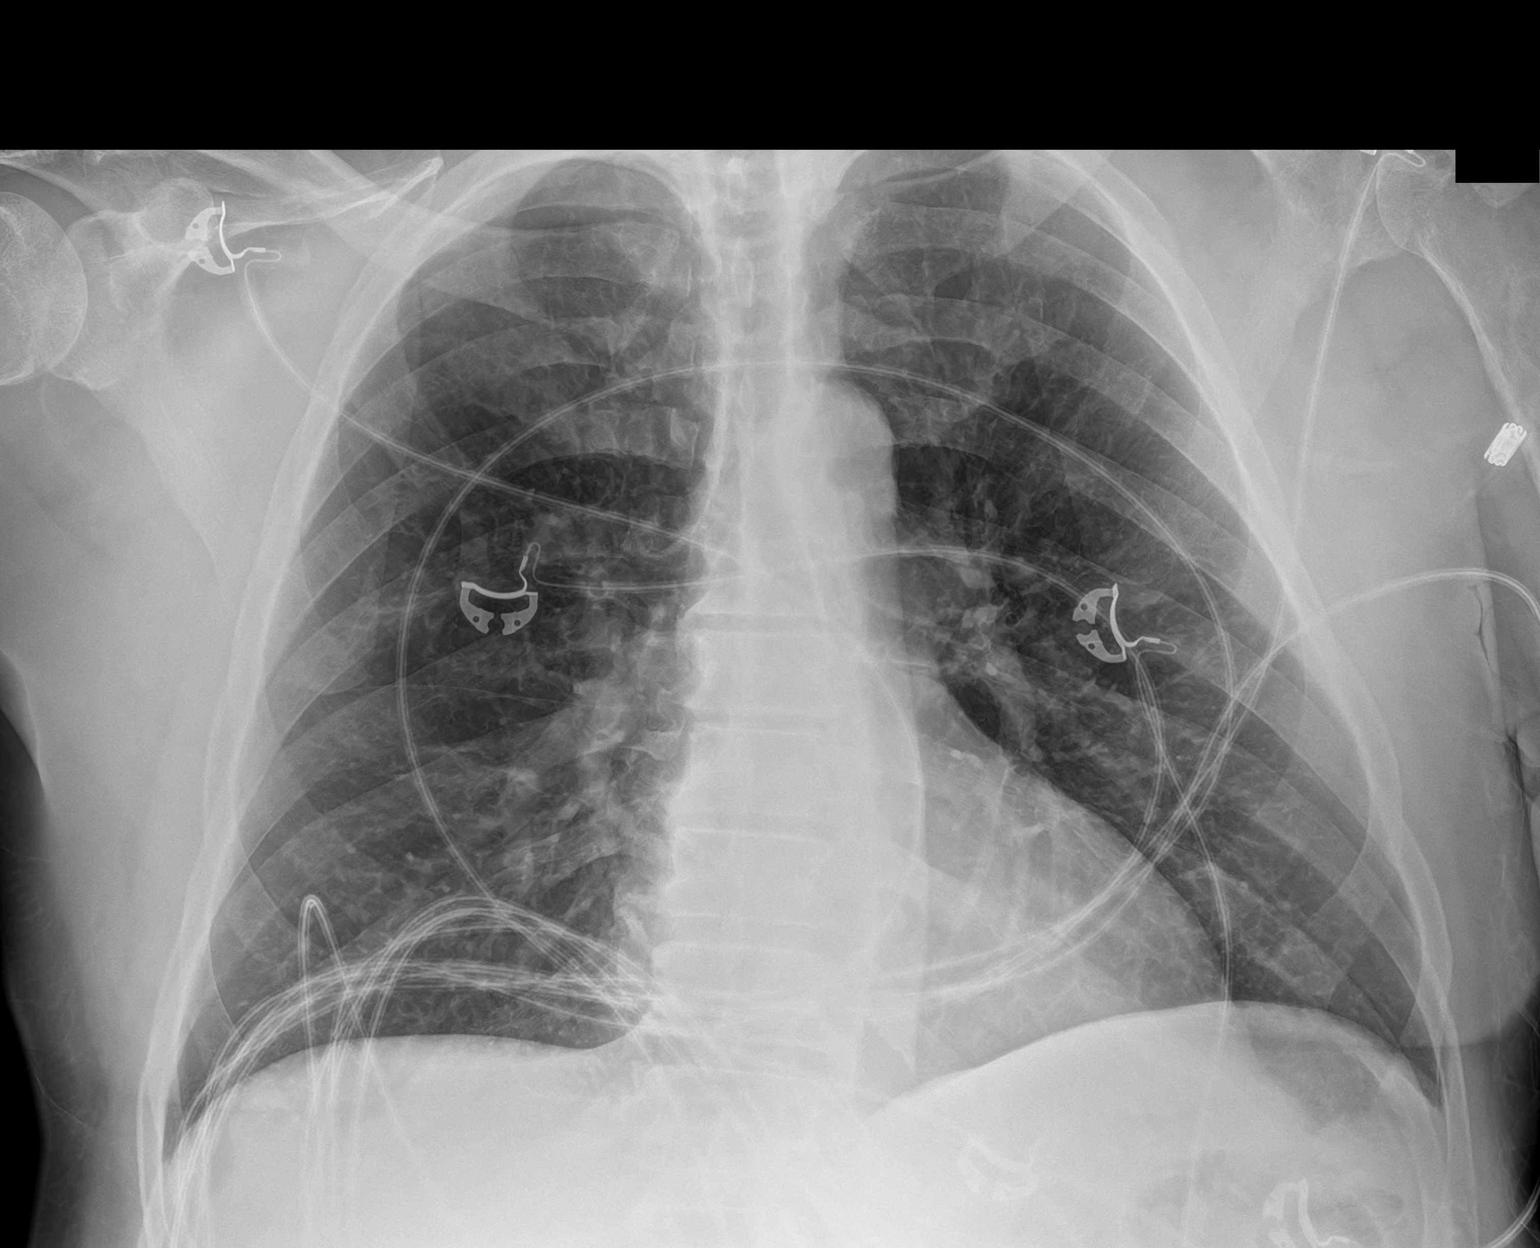

[1 of 1 positions shown; findings below may reference images not displayed]

FINDINGS: The cardiomediastinal contours are normal. The lungs are clear.
Pulmonary vasculature is normal. No consolidation, pleural effusion,
or pneumothorax. No acute osseous abnormalities are seen.
IMPRESSION: No acute chest findings.

## 2022-02-17 NOTE — ED Provider Notes (Signed)
? ?[facility]  ?Provider Note ? ?CSN: [id number] ?Arrival date & time: 02/17/22 2037 ? ?History ?Chief Complaint  ?Patient presents with  ? Weakness  ? ? ?Matthew Wise is a 61 y.o. 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 He reports some generalized weakness for the last few days with some feeling swimmy headed. Maybe some low BPs at home. He was found to have a fever on arrival, no known fevers at home. He denies any CP, SOB, cough, N/V/D, he has had some urinary frequency and hesitancy.  ? ? ?Home Medications ?Prior to Admission medications   ?Medication Sig Start Date End Date Taking? Authorizing Provider  ?cephALEXin (KEFLEX) 500 MG capsule Take 1 capsule (500 mg total) by mouth 2 (two) times daily for 7 days. 02/18/22 02/25/22 Yes Truddie Hidden, MD  ?acetaminophen (TYLENOL) 325 MG tablet Take 2 tablets (650 mg total) by mouth every 4 (four) hours as needed for mild pain (or temp > 37.5 C (99.5 F)). 03/11/21   Angiulli, Lavon Paganini, PA-C  ?amLODipine (NORVASC) 5 MG tablet Take 1 tablet (5 mg total) by mouth daily. 12/11/21   Lindell Spar, MD  ?aspirin EC 81 MG EC tablet Take 1 tablet (81 mg total) by mouth daily. Swallow whole. 02/13/21   Bailey-Modzik, Delila A, NP  ?atorvastatin (LIPITOR) 40 MG tablet Take 1 tablet (40 mg total) by mouth daily. 12/08/21   Lindell Spar, MD  ?levETIRAcetam (KEPPRA) 500 MG tablet Take 1 tablet (500 mg total) by mouth 2 (two) times daily. 12/17/21   Frann Rider, NP  ?levothyroxine (SYNTHROID) 25 MCG tablet TAKE 1 TABLET BY MOUTH EVERY DAY 06/09/21   Ailene Ards, NP  ?lisinopril (ZESTRIL) 10 MG tablet Take 1 tablet (10 mg total) by mouth daily. 12/08/21   Lindell Spar, MD  ?pantoprazole (PROTONIX) 40 MG tablet TAKE 1 TABLET BY MOUTH EVERYDAY AT BEDTIME 08/01/21   Ailene Ards, NP  ?PRADAXA 150 MG CAPS capsule TAKE 1 CAPSULE BY MOUTH EVERY 12 HOURS 12/31/21   Lindell Spar, MD   ?senna-docusate (SENOKOT-S) 8.6-50 MG tablet Take 1 tablet by mouth at bedtime. 02/12/21   Bailey-Modzik, Delila A, NP  ?tamsulosin (FLOMAX) 0.4 MG CAPS capsule Take 1 capsule (0.4 mg total) by mouth daily after supper. 12/08/21   Lindell Spar, MD  ? ? ? ?Allergies    ?Patient has no known allergies. ? ? ?Review of Systems   ?Review of Systems ?Please see HPI for pertinent positives and negatives ? ?Physical Exam ?BP 128/75   Pulse 81   Temp (!) 101.7 ?F (38.7 ?C) (Oral)   Resp 18   Ht 6' (1.829 m)   Wt 107 kg   SpO2 98%   BMI 32.01 kg/m?  ? ?Physical Exam ?Vitals and nursing note reviewed.  ?Constitutional:   ?   Appearance: Normal appearance.  ?HENT:  ?   Head: Normocephalic and atraumatic.  ?   Nose: Nose normal.  ?   Mouth/Throat:  ?   Mouth: Mucous membranes are moist.  ?Eyes:  ?   Extraocular Movements: Extraocular movements intact.  ?   Conjunctiva/sclera: Conjunctivae normal.  ?Cardiovascular:  ?   Rate and Rhythm: Normal rate.  ?Pulmonary:  ?   Effort: Pulmonary effort is normal.  ?   Breath sounds: Normal breath sounds.  ?Abdominal:  ?   General: Abdomen is flat.  ?  Palpations: Abdomen is soft.  ?   Tenderness: There is no abdominal tenderness.  ?Musculoskeletal:     ?   General: No swelling. Normal range of motion.  ?   Cervical back: Neck supple.  ?Skin: ?   General: Skin is warm and dry.  ?Neurological:  ?   Mental Status: He is alert and oriented to person, place, and time.  ?   Comments: L sided hemiparesis  ?Psychiatric:     ?   Mood and Affect: Mood normal.  ? ? ?ED Results / Procedures / Treatments   ?EKG ?EKG Interpretation ? ?Date/Time:  Tuesday February 17 2022 21:00:52 EDT ?Ventricular Rate:  80 ?PR Interval:  144 ?QRS Duration: 84 ?QT Interval:  360 ?QTC Calculation: 416 ?R Axis:   30 ?Text Interpretation: Sinus rhythm Nonspecific T abnormalities, inferior leads No significant change since last tracing Confirmed by Calvert Cantor [phone]) on 02/17/2022 11:28:36  PM ? ?Procedures ?Procedures ? ?Medications Ordered in the ED ?Medications  ?cefTRIAXone (ROCEPHIN) 1 g in sodium chloride 0.9 % 100 mL IVPB (1 g Intravenous New Bag/Given 02/18/22 0022)  ? ? ?Initial Impression and Plan ? Patient noted to be febrile here, had reported low BP so sepsis protocol was initiated prior to my assessment. He has normal HR and BP now. Suspect UTI based on symptoms but will check other labs and CXR in the meantime.  ? ?ED Course  ? ?Clinical Course as of 02/18/22 0046  ?Tue Feb 17, 2022  ?2327 CBC is unremarkable. Lactic acid is neg.  [CS]  ?2333 Coags consistent with Pradaxa use.  [CS]  ?Wed Feb 18, 2022  ?0002 CMP with CKD at baseline. UA with some blood, borderline for infection but given his symptoms if no other obvious source is apparent will need treatment.  [CS]  ?27 I personally viewed the images from radiology studies and agree with radiologist interpretation: CXR is clear ? [CS]  ?0015 Covid/Flu negative. Will treat UTI with rocephin.  [CS]  ?0045 Discussed results with patient including treatment for UTI. Otherwise he looks well and is stable for discharge home with oral Abx. He is amenable to this plan.  [CS]  ?  ?Clinical Course User Index ?[CS] Truddie Hidden, MD  ? ? ? ?MDM Rules/Calculators/A&P ?Medical Decision Making ?Given presenting complaint, I considered that admission might be necessary. After review of results from ED lab and/or imaging studies, admission to the hospital is not indicated at this time.  ? ? ?Problems Addressed: ?Acute cystitis without hematuria: acute illness or injury ?Generalized weakness: acute illness or injury ? ?Amount and/or Complexity of Data Reviewed ?Labs: ordered. Decision-making details documented in ED Course. ?Radiology: ordered and independent interpretation performed. Decision-making details documented in ED Course. ?ECG/medicine tests: ordered and independent interpretation performed. Decision-making details documented in ED  Course. ? ?Risk ?Prescription drug management. ?Decision regarding hospitalization. ? ? ? ?Final Clinical Impression(s) / ED Diagnoses ?Final diagnoses:  ?Acute cystitis without hematuria  ?Generalized weakness  ? ? ?Rx / DC Orders ?ED Discharge Orders   ? ?      Ordered  ?  cephALEXin (KEFLEX) 500 MG capsule  2 times daily       ? 02/18/22 0046  ? ?  ?  ? ?  ? ?  ?Truddie Hidden, MD ?02/18/22 [phone] ? ?

## 2022-02-17 NOTE — ED Triage Notes (Signed)
Pt with c/o generalized weakness x 2 days. States his BP was "running low yesterday".  ?

## 2022-02-18 ENCOUNTER — Ambulatory Visit (HOSPITAL_COMMUNITY): Payer: Medicare Other | Admitting: Physical Therapy

## 2022-02-18 ENCOUNTER — Ambulatory Visit (HOSPITAL_COMMUNITY): Payer: Medicare Other

## 2022-02-18 LAB — RESP PANEL BY RT-PCR (FLU A&B, COVID) ARPGX2
Influenza A by PCR: NEGATIVE
Influenza B by PCR: NEGATIVE
SARS Coronavirus 2 by RT PCR: NEGATIVE

## 2022-02-18 LAB — LACTIC ACID, PLASMA: Lactic Acid, Venous: 1.1 mmol/L (ref 0.5–1.9)

## 2022-02-18 MED ORDER — CEPHALEXIN 500 MG PO CAPS
500.0000 mg | ORAL_CAPSULE | Freq: Two times a day (BID) | ORAL | 0 refills | Status: AC
Start: 2022-02-18 — End: 2022-02-25

## 2022-02-18 MED ORDER — SODIUM CHLORIDE 0.9 % IV SOLN
1.0000 g | Freq: Once | INTRAVENOUS | Status: AC
  Administered 2022-02-18: 1 g via INTRAVENOUS
  Filled 2022-02-18: qty 10

## 2022-02-19 ENCOUNTER — Other Ambulatory Visit: Payer: Self-pay

## 2022-02-19 ENCOUNTER — Encounter (HOSPITAL_COMMUNITY): Payer: Self-pay

## 2022-02-19 ENCOUNTER — Emergency Department (HOSPITAL_COMMUNITY)
Admission: EM | Admit: 2022-02-19 | Discharge: 2022-02-19 | Disposition: A | Discharge status: Home / Self Care | Payer: Medicare Other | Attending: Emergency Medicine | Admitting: Emergency Medicine

## 2022-02-19 DIAGNOSIS — E860 Dehydration: Secondary | ICD-10-CM | POA: Insufficient documentation

## 2022-02-19 DIAGNOSIS — R6889 Other general symptoms and signs: Secondary | ICD-10-CM | POA: Diagnosis not present

## 2022-02-19 DIAGNOSIS — Z7982 Long term (current) use of aspirin: Secondary | ICD-10-CM | POA: Insufficient documentation

## 2022-02-19 DIAGNOSIS — Z743 Need for continuous supervision: Secondary | ICD-10-CM | POA: Diagnosis not present

## 2022-02-19 DIAGNOSIS — R531 Weakness: Secondary | ICD-10-CM | POA: Diagnosis not present

## 2022-02-19 DIAGNOSIS — N3000 Acute cystitis without hematuria: Secondary | ICD-10-CM | POA: Diagnosis not present

## 2022-02-19 LAB — CBC
HCT: 35.7 % — ABNORMAL LOW (ref 39.0–52.0)
Hemoglobin: 11.8 g/dL — ABNORMAL LOW (ref 13.0–17.0)
MCH: 27.4 pg (ref 26.0–34.0)
MCHC: 33.1 g/dL (ref 30.0–36.0)
MCV: 82.8 fL (ref 80.0–100.0)
Platelets: 177 10*3/uL (ref 150–400)
RBC: 4.31 MIL/uL (ref 4.22–5.81)
RDW: 13.5 % (ref 11.5–15.5)
WBC: 10 10*3/uL (ref 4.0–10.5)
nRBC: 0 % (ref 0.0–0.2)

## 2022-02-19 LAB — BASIC METABOLIC PANEL
Anion gap: 10 (ref 5–15)
BUN: 27 mg/dL — ABNORMAL HIGH (ref 6–20)
CO2: 24 mmol/L (ref 22–32)
Calcium: 8.8 mg/dL — ABNORMAL LOW (ref 8.9–10.3)
Chloride: 104 mmol/L (ref 98–111)
Creatinine, Ser: 2.2 mg/dL — ABNORMAL HIGH (ref 0.61–1.24)
GFR, Estimated: 34 mL/min — ABNORMAL LOW (ref >60)
Glucose, Bld: 125 mg/dL — ABNORMAL HIGH (ref 70–99)
Potassium: 3.6 mmol/L (ref 3.5–5.1)
Sodium: 138 mmol/L (ref 135–145)

## 2022-02-19 MED ORDER — SODIUM CHLORIDE 0.9 % IV BOLUS
2000.0000 mL | Freq: Once | INTRAVENOUS | Status: AC
  Administered 2022-02-19: 2000 mL via INTRAVENOUS

## 2022-02-19 NOTE — ED Provider Notes (Signed)
?[facility] ?Provider Note ? ? ?CSN: [id number] ?Arrival date & time: 02/19/22  1104 ? ?  ? ?History ? ?Chief Complaint  ?Patient presents with  ? Weakness  ? ? ?Matthew Wise is a 61 y.o. male. ? ?HPI ?Patient with multiple medical issues presents 2 days after being seen and evaluated here, diagnosed with urinary tract infection now with concern for ongoing weakness.  No new pain, nausea, vomiting, fever, chills.  He notes that he took 1 dose of his antibiotic the day of discharge, has taken none since, this is unclear as to why not.  Today presents with ongoing weakness generally, no focality. ?  ? ?Home Medications ?Prior to Admission medications   ?Medication Sig Start Date End Date Taking? Authorizing Provider  ?acetaminophen (TYLENOL) 325 MG tablet Take 2 tablets (650 mg total) by mouth every 4 (four) hours as needed for mild pain (or temp > 37.5 C (99.5 F)). 03/11/21  Yes Angiulli, Lavon Paganini, PA-C  ?amLODipine (NORVASC) 5 MG tablet Take 1 tablet (5 mg total) by mouth daily. 12/11/21  Yes Lindell Spar, MD  ?aspirin EC 81 MG EC tablet Take 1 tablet (81 mg total) by mouth daily. Swallow whole. 02/13/21  Yes Bailey-Modzik, Delila A, NP  ?atorvastatin (LIPITOR) 40 MG tablet Take 1 tablet (40 mg total) by mouth daily. 12/08/21  Yes Lindell Spar, MD  ?cephALEXin (KEFLEX) 500 MG capsule Take 1 capsule (500 mg total) by mouth 2 (two) times daily for 7 days. 02/18/22 02/25/22 Yes Truddie Hidden, MD  ?levETIRAcetam (KEPPRA) 500 MG tablet Take 1 tablet (500 mg total) by mouth 2 (two) times daily. 12/17/21  Yes Frann Rider, NP  ?levothyroxine (SYNTHROID) 25 MCG tablet TAKE 1 TABLET BY MOUTH EVERY DAY 06/09/21  Yes Ailene Ards, NP  ?lisinopril (ZESTRIL) 10 MG tablet Take 1 tablet (10 mg total) by mouth daily. 12/08/21  Yes Lindell Spar, MD  ?pantoprazole (PROTONIX) 40 MG tablet TAKE 1 TABLET BY MOUTH EVERYDAY AT BEDTIME 08/01/21  Yes Ailene Ards, NP  ?PRADAXA 150 MG CAPS capsule TAKE 1 CAPSULE  BY MOUTH EVERY 12 HOURS 12/31/21  Yes Lindell Spar, MD  ?senna-docusate (SENOKOT-S) 8.6-50 MG tablet Take 1 tablet by mouth at bedtime. 02/12/21  Yes Bailey-Modzik, Delila A, NP  ?tamsulosin (FLOMAX) 0.4 MG CAPS capsule Take 1 capsule (0.4 mg total) by mouth daily after supper. 12/08/21  Yes Lindell Spar, MD  ?   ? ?Allergies    ?Patient has no known allergies.   ? ?Review of Systems   ?Review of Systems  ?Constitutional:   ?     Per HPI, otherwise negative  ?HENT:    ?     Per HPI, otherwise negative  ?Respiratory:    ?     Per HPI, otherwise negative  ?Cardiovascular:   ?     Per HPI, otherwise negative  ?Gastrointestinal:  Negative for vomiting.  ?Endocrine:  ?     Negative aside from HPI  ?Genitourinary:   ?     Neg aside from HPI   ?Musculoskeletal:   ?     Per HPI, otherwise negative  ?Skin: Negative.   ?Neurological:  Positive for weakness. Negative for syncope.  ? ?Physical Exam ?Updated Vital Signs ?BP 127/79   Pulse (!) 59   Temp 98.6 ?F (37 ?C) (Oral)   Resp 13   Ht 6' (1.829 m)   Wt 106.6 kg   SpO2 94%  BMI 31.87 kg/m?  ?Physical Exam ?Vitals and nursing note reviewed.  ?Constitutional:   ?   General: He is not in acute distress. ?   Appearance: He is well-developed.  ?HENT:  ?   Head: Normocephalic and atraumatic.  ?Eyes:  ?   Conjunctiva/sclera: Conjunctivae normal.  ?Cardiovascular:  ?   Rate and Rhythm: Normal rate and regular rhythm.  ?Pulmonary:  ?   Effort: Pulmonary effort is normal. No respiratory distress.  ?   Breath sounds: No stridor.  ?Abdominal:  ?   General: There is no distension.  ?Skin: ?   General: Skin is warm and dry.  ?Neurological:  ?   Mental Status: He is alert and oriented to person, place, and time.  ? ? ?ED Results / Procedures / Treatments   ?Labs ?(all labs ordered are listed, but only abnormal results are displayed) ?Labs Reviewed  ?BASIC METABOLIC PANEL - Abnormal; Notable for the following components:  ?    Result Value  ? Glucose, Bld 125 (*)   ? BUN 27 (*)   ?  Creatinine, Ser 2.20 (*)   ? Calcium 8.8 (*)   ? GFR, Estimated 34 (*)   ? All other components within normal limits  ?CBC - Abnormal; Notable for the following components:  ? Hemoglobin 11.8 (*)   ? HCT 35.7 (*)   ? All other components within normal limits  ?URINALYSIS, ROUTINE W REFLEX MICROSCOPIC  ? ? ?EKG ?None ? ?Radiology ?DG Chest Port 1 View ? ?Result Date: 02/17/2022 ?CLINICAL DATA:  Questionable sepsis - evaluate for abnormality Weakness. EXAM: PORTABLE CHEST 1 VIEW COMPARISON:  10/11/2014 FINDINGS: The cardiomediastinal contours are normal. The lungs are clear. Pulmonary vasculature is normal. No consolidation, pleural effusion, or pneumothorax. No acute osseous abnormalities are seen. IMPRESSION: No acute chest findings. Electronically Signed   By: Keith Rake M.D.   On: 02/17/2022 23:48   ? ?Procedures ?Procedures  ? ? ?Medications Ordered in ED ?Medications  ?sodium chloride 0.9 % bolus 2,000 mL (2,000 mLs Intravenous New Bag/Given 02/19/22 1403)  ? ? ?ED Course/ Medical Decision Making/ A&P ?This patient with a Hx of recent urinary tract infection, prior PE presents to the ED for concern of weakness, this involves an extensive number of treatment options, and is a complaint that carries with it a high risk of complications and morbidity.   ? ?The differential diagnosis includes pneumonia, UTI, bacteremia, sepsis ? ? ?Social Determinants of Health: ? ?None ? ?Additional history obtained: ? ?Additional history and/or information obtained from chart review,  ? ? ?After the initial evaluation, orders, including: Fluids, labs were initiated. ? ? ?Patient placed on Cardiac and Pulse-Oximetry Monitors. ?The patient was maintained on a cardiac monitor.  The cardiac monitored showed an rhythm of 60 sinus normal ?The patient was also maintained on pulse oximetry. The readings were typically 100% room air normal ? ? ?On repeat evaluation of the patient improved ?3:40 PM ?Patient awake, alert, sitting  upright.  No hemodynamic instability.  He has received most of his fluid resuscitation, and we discussed findings thus far, including slight elevation in creatinine, though not meeting criteria for AKI.  Urinalysis not available ?Lab Tests: ? ?I personally interpreted labs.  The pertinent results include: As above creatinine elevated, compared to couple days ago ? ? ? ?Dispostion / Final MDM: ? ?After consideration of the diagnostic results and the patient's response to treatment, he is appropriate for discharge.  Patient is not hemodynamically unstable, there is  no evidence for bacteremia, sepsis.  He is only taken 1 dose of his antibiotic after recently being diagnosed with cystitis.  Now with fluid resuscitation, for several liters, he is dehydration has been addressed.  Patient is appropriate for follow-up with his primary care physician, was encouraged to take his antibiotics as previously prescribed stay well-hydrated, and return here for concerning changes in his condition. ? ?Final Clinical Impression(s) / ED Diagnoses ?Final diagnoses:  ?Weakness  ?Dehydration  ? ?  ?Carmin Muskrat, MD ?02/19/22 1543 ? ?

## 2022-02-19 NOTE — ED Triage Notes (Signed)
Patient via EMS for weakness. Patient seen 2 days prior in the ED and treated for UTI.  ?

## 2022-02-19 NOTE — Discharge Instructions (Signed)
As discussed, your evaluation today has been largely reassuring.  But, it is important that you monitor your condition carefully, and do not hesitate to return to the ED if you develop new, or concerning changes in your condition. ? ?Please be sure to stay focused on your hydration. ? ?Otherwise, please follow-up with your physician for appropriate ongoing care. ? ?

## 2022-02-19 NOTE — ED Notes (Signed)
Advised wife to take BP every morning and keep a log and if BP is <100 SBP to not take BP meds.  ?

## 2022-02-20 ENCOUNTER — Other Ambulatory Visit (INDEPENDENT_AMBULATORY_CARE_PROVIDER_SITE_OTHER): Payer: Self-pay | Admitting: Nurse Practitioner

## 2022-02-20 DIAGNOSIS — Z7901 Long term (current) use of anticoagulants: Secondary | ICD-10-CM

## 2022-02-23 ENCOUNTER — Other Ambulatory Visit: Payer: Self-pay | Admitting: *Deleted

## 2022-02-23 ENCOUNTER — Ambulatory Visit (HOSPITAL_COMMUNITY)
Admission: RE | Admit: 2022-02-23 | Discharge: 2022-02-23 | Disposition: A | Discharge status: Home / Self Care | Payer: Medicare Other | Source: Ambulatory Visit | Attending: Internal Medicine | Admitting: Internal Medicine

## 2022-02-23 ENCOUNTER — Encounter: Payer: Self-pay | Admitting: Internal Medicine

## 2022-02-23 ENCOUNTER — Other Ambulatory Visit: Payer: Self-pay

## 2022-02-23 ENCOUNTER — Telehealth: Payer: Self-pay

## 2022-02-23 ENCOUNTER — Ambulatory Visit (INDEPENDENT_AMBULATORY_CARE_PROVIDER_SITE_OTHER): Payer: Medicare Other | Admitting: Internal Medicine

## 2022-02-23 VITALS — BP 92/62 | HR 82 | Resp 18 | Ht 72.0 in

## 2022-02-23 DIAGNOSIS — Z09 Encounter for follow-up examination after completed treatment for conditions other than malignant neoplasm: Secondary | ICD-10-CM | POA: Diagnosis not present

## 2022-02-23 DIAGNOSIS — G8110 Spastic hemiplegia affecting unspecified side: Secondary | ICD-10-CM | POA: Diagnosis not present

## 2022-02-23 DIAGNOSIS — G9389 Other specified disorders of brain: Secondary | ICD-10-CM | POA: Diagnosis not present

## 2022-02-23 DIAGNOSIS — I10 Essential (primary) hypertension: Secondary | ICD-10-CM

## 2022-02-23 DIAGNOSIS — R32 Unspecified urinary incontinence: Secondary | ICD-10-CM

## 2022-02-23 DIAGNOSIS — N3000 Acute cystitis without hematuria: Secondary | ICD-10-CM

## 2022-02-23 LAB — CULTURE, BLOOD (ROUTINE X 2)
Culture: NO GROWTH
Culture: NO GROWTH
Special Requests: ADEQUATE
Special Requests: ADEQUATE

## 2022-02-23 IMAGING — MR MR HEAD W/O CM
10 of 11 series · 42 of 48 positions shown · non-contrast
Comparison: MRI [DATE].

CLINICAL DATA: Stroke, follow up Neuro deficit, acute, stroke
suspected

EXAM:
MRI HEAD WITHOUT CONTRAST
TECHNIQUE: Multiplanar, multiecho pulse sequences of the brain and surrounding
structures were obtained without intravenous contrast.

[Series 5: DWI · axial · 3.0mm · 0.77mm/px · z∈[-74,+67]mm · 5 of 49 slices shown (1 of 6)]
[im 1/49]
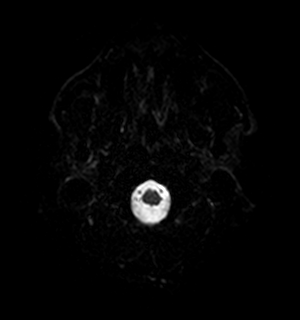
[im 13/49]
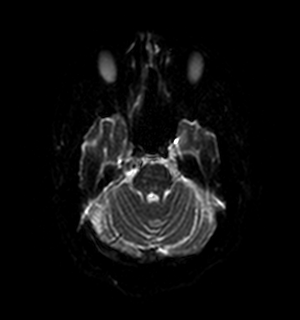
[im 25/49]
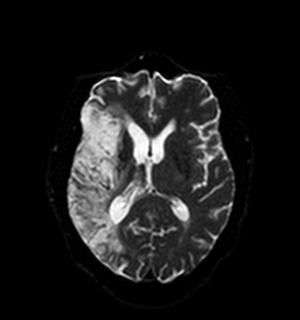
[im 37/49]
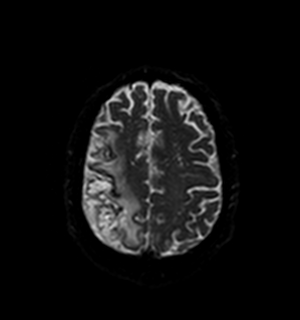
[im 49/49]
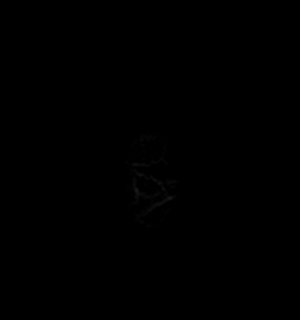

[Series 5: DWI · axial · 3.0mm · 0.77mm/px · z∈[-74,+67]mm · 5 of 49 slices shown (2 of 6)]
[im 1/49]
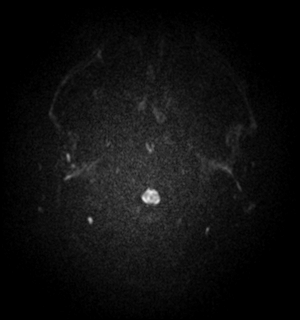
[im 13/49]
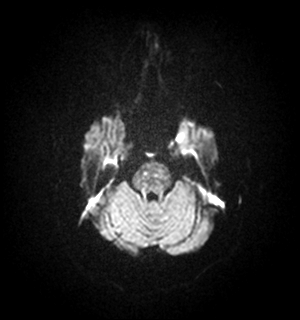
[im 25/49]
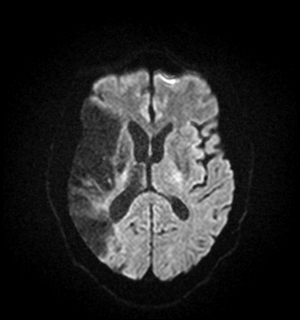
[im 37/49]
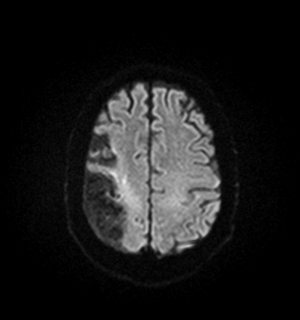
[im 49/49]
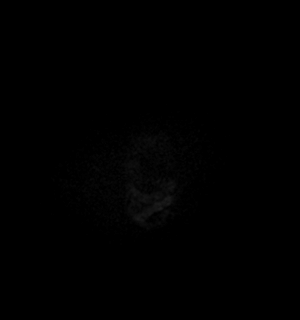

[Series 6: DWI · axial · 3.0mm · 0.77mm/px · z∈[-74,+67]mm · 6 of 49 slices shown (3 of 6)]
[im 1/49]
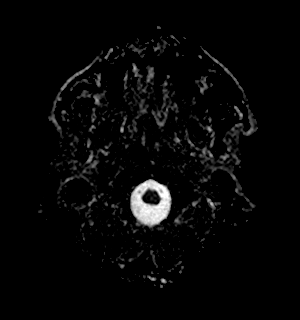
[im 10/49]
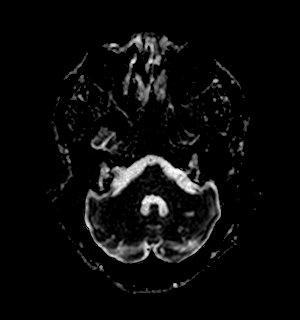
[im 20/49]
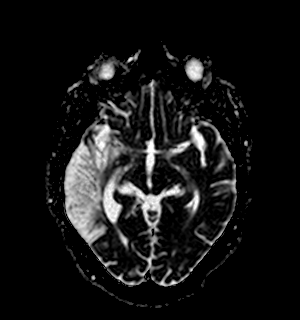
[im 29/49]
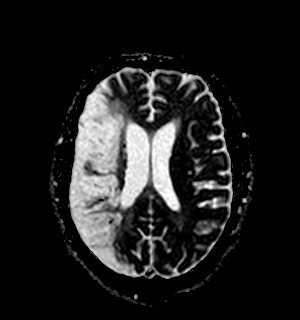
[im 39/49]
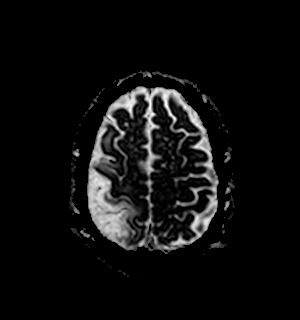
[im 49/49]
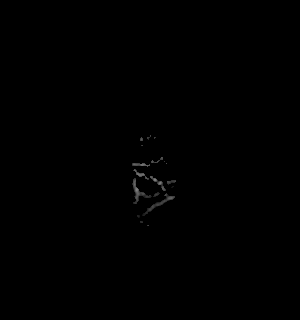

[Series 7: DWI · coronal · 5.0mm · 0.88mm/px · 4 of 29 slices shown (4 of 6)]
[im 1/29]
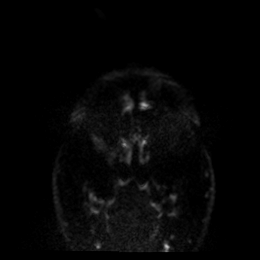
[im 10/29]
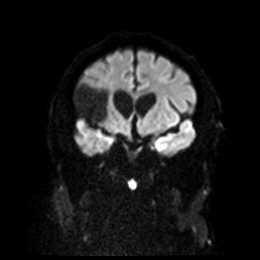
[im 19/29]
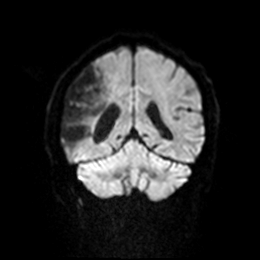
[im 29/29]
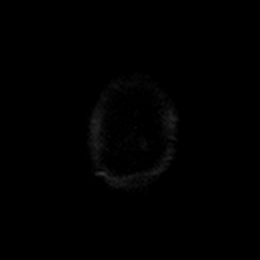

[Series 7: DWI · coronal · 5.0mm · 0.88mm/px · 4 of 29 slices shown (5 of 6)]
[im 1/29]
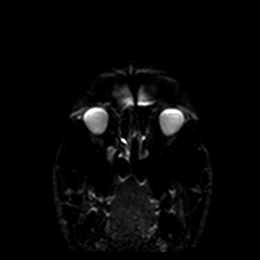
[im 10/29]
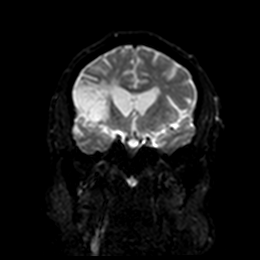
[im 19/29]
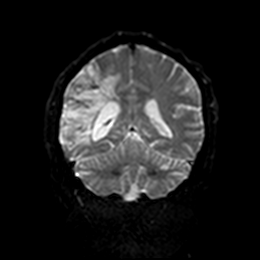
[im 29/29]
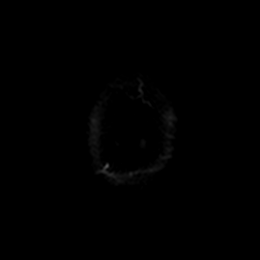

[Series 8: DWI · coronal · 5.0mm · 0.88mm/px · 4 of 29 slices shown (6 of 6)]
[im 1/29]
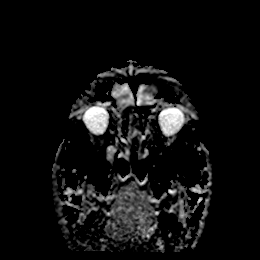
[im 10/29]
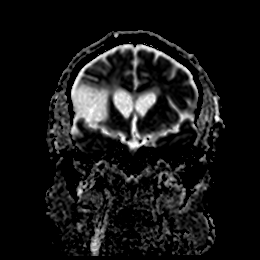
[im 19/29]
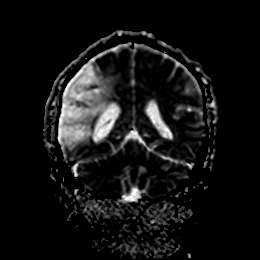
[im 29/29]
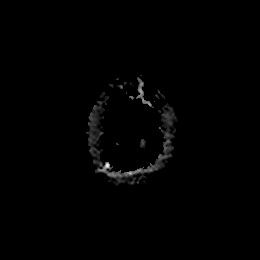

[Series 10: T2 · axial · 5.0mm · 0.72mm/px · z∈[-74,+70]mm · 3 of 22 slices shown]
[im 1/22]
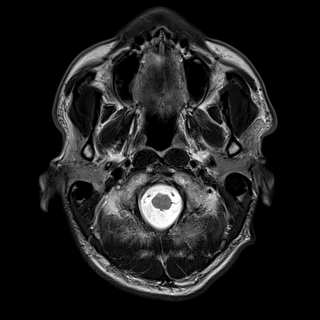
[im 11/22]
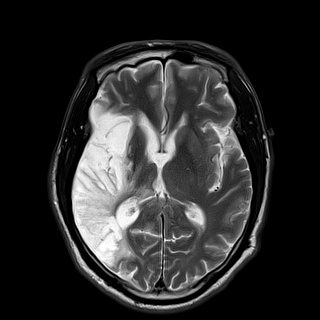
[im 22/22]
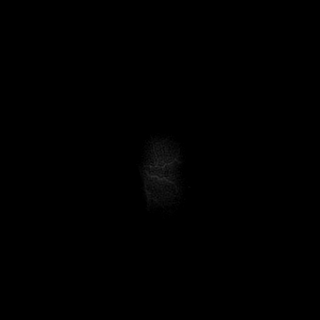

[Series 11: ax hemo · axial · 5.0mm · 0.86mm/px · z∈[-69,+72]mm · 3 of 25 slices shown]
[im 1/25]
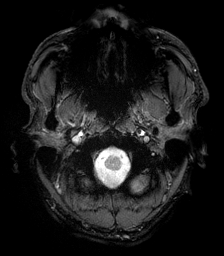
[im 13/25]
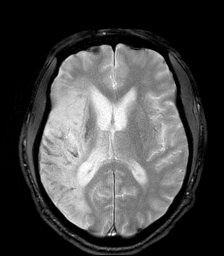
[im 25/25]
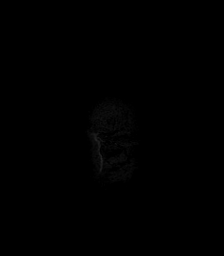

[Series 12: FLAIR · axial · 4.0mm · 0.43mm/px · z∈[-72,+73]mm · 5 of 38 slices shown]
[im 1/38]
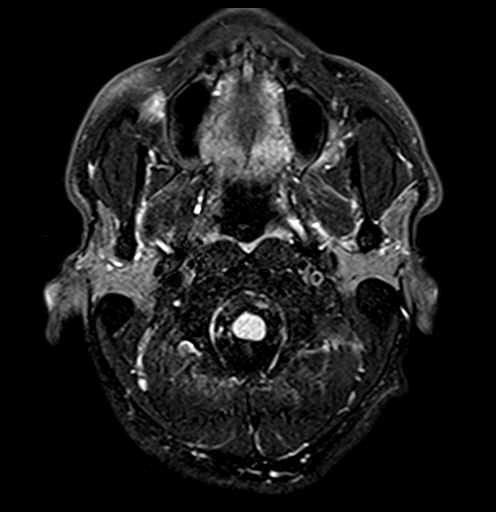
[im 10/38]
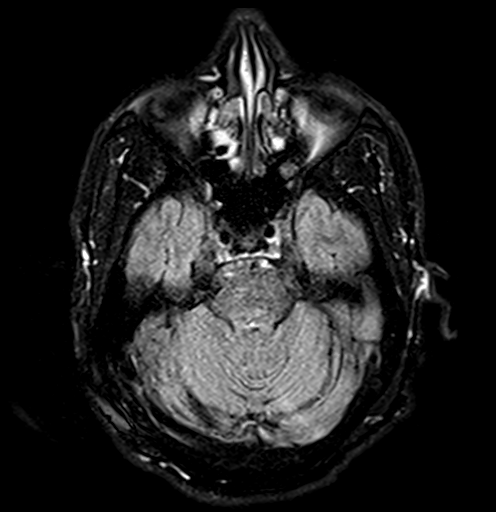
[im 19/38]
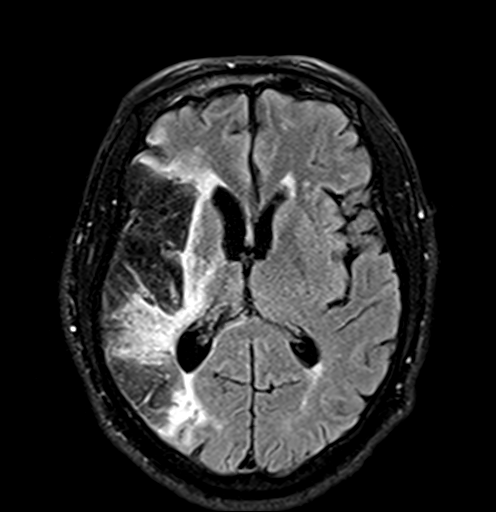
[im 28/38]
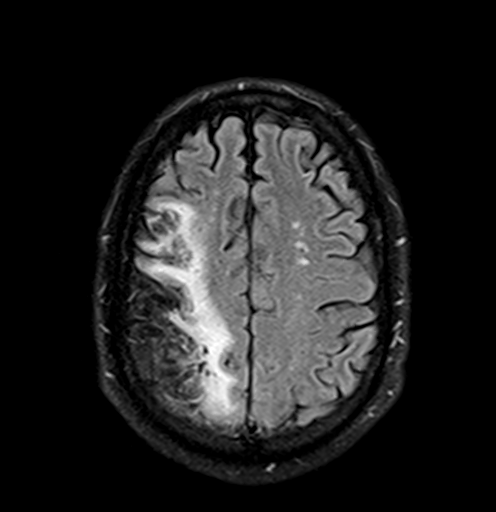
[im 38/38]
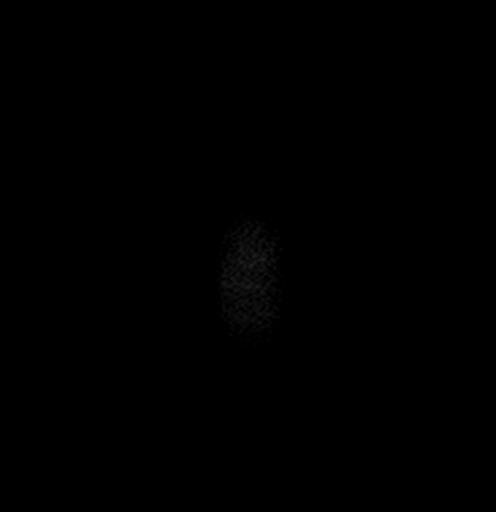

[Series 14: T1 · sagittal · 5.0mm · 0.75mm/px · 3 of 22 slices shown]
[im 1/22]
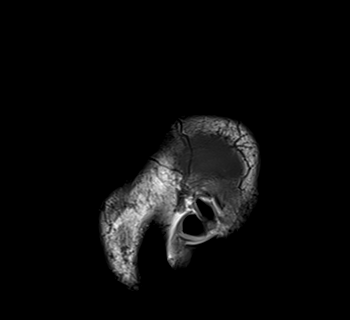
[im 11/22]
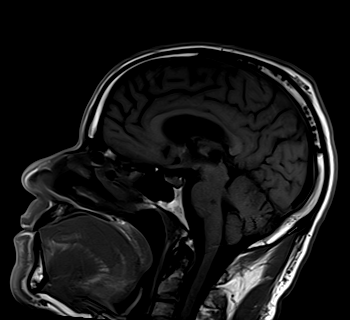
[im 22/22]
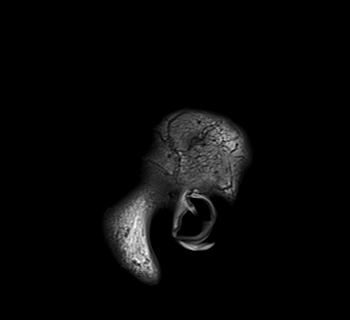

[42 of 48 positions shown; findings below may reference images not displayed]

FINDINGS: Brain: Large remote right MCA territory infarct with associated
encephalomalacia and surrounding gliosis. There are a few areas of
punctate mild DWI hyperintensity along the periphery of this infarct
without clear ADC correlate. Curvilinear susceptibility artifact in
the region of prior infarct, compatible with prior hemorrhage. No
evidence of acute hemorrhage. Multiple remote lacunar infarcts in
the cerebellum bilaterally. Wallerian degeneration in the right
midbrain. Additional scattered T2/FLAIR hyperintensities in the
white matter, nonspecific but compatible with chronic microvascular
disease. No hydrocephalus.

Vascular: Major arterial flow voids are maintained at the skull
base. Small vertebrobasilar system with probable fetal type PCAs.

Skull and upper cervical spine: Normal marrow signal.

Sinuses/Orbits: Right maxillary sinus retention cyst. No acute
orbital findings.

Other: No mastoid effusions.
IMPRESSION: 1. Large remote right MCA territory infarct. There are a few areas
of punctate mild DWI hyperintensity along the periphery of this
infarct without clear ADC correlate. These could potentially
represent tiny acute/subacute areas of peri-infarct ischemia and/or
artifact related to hemosiderin.
2. Multiple remote bilateral cerebellar lacunar infarcts and chronic
microvascular ischemic disease.

## 2022-02-23 NOTE — Telephone Encounter (Signed)
Faxed to [facility]  ?

## 2022-02-23 NOTE — Patient Instructions (Signed)
Please stop taking Lisinopril for now. ? ?Please continue taking other medications as prescribed. ? ?Please maintain at least 64 ounces of fluid intake. ?

## 2022-02-23 NOTE — Telephone Encounter (Signed)
Called needs prescription sent to pharmacy for leg bag.   ? ?[facility]: [facility] Fax# [phone] ?

## 2022-02-24 ENCOUNTER — Telehealth: Payer: Self-pay | Admitting: *Deleted

## 2022-02-24 DIAGNOSIS — Z8673 Personal history of transient ischemic attack (TIA), and cerebral infarction without residual deficits: Secondary | ICD-10-CM

## 2022-02-24 DIAGNOSIS — G8110 Spastic hemiplegia affecting unspecified side: Secondary | ICD-10-CM

## 2022-02-24 NOTE — Telephone Encounter (Signed)
Matthew Wise has been in and out of the Ed 2x recently and he is weaker and not moving his leg and arm like he was previously. She had to cancel his therapy at [facility] because he is weaker. She is asking if he can get some home therapy until he gets stronger. ?

## 2022-02-25 ENCOUNTER — Telehealth: Payer: Self-pay

## 2022-02-25 LAB — URINE CULTURE: Organism ID, Bacteria: NO GROWTH

## 2022-02-25 NOTE — Telephone Encounter (Signed)
Please verify if he needs leg bags or regular bags as I sent the leg bags last week ?

## 2022-02-25 NOTE — Telephone Encounter (Signed)
Orders placed for [facility] PT/OT with [facility]. Mrs Desaulniers notified. ?

## 2022-02-25 NOTE — Telephone Encounter (Signed)
Patient will contact pharmacy. ?

## 2022-02-25 NOTE — Telephone Encounter (Signed)
Please have him contact the pharmacy to send [place] a request I am unsure of what patient is needing  ?

## 2022-02-25 NOTE — Telephone Encounter (Signed)
Called need prescription for bags for urine catheter send to [facility] ?

## 2022-02-27 DIAGNOSIS — Z09 Encounter for follow-up examination after completed treatment for conditions other than malignant neoplasm: Secondary | ICD-10-CM | POA: Insufficient documentation

## 2022-02-27 NOTE — Assessment & Plan Note (Signed)
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

## 2022-02-27 NOTE — Progress Notes (Signed)
? ?Acute Office Visit ? ?Subjective:  ? ? Patient ID: Matthew Wise, male    DOB: 27-May-1961, 61 y.o.   MRN: [id number] ? ?Chief Complaint  ?Patient presents with  ? Follow-up  ?  Er follow up pt went 3/21 pt has had stroke in past was told UTI and dehydration pt has been very weak and bp has been running low had to go back to er again and then call paramedics last night since this episode pt has not been able to move left side he was able to move it a little bit   ? ? ?HPI ?Patient is in today for complaint of recent worsening of his left UE and LE weakness.  He went to ER recently with these symptoms, and was told about UTI.  He was given Keflex, but he states that he had worse weakness of his left UE and LE for 2 days after starting the antibiotic.  He still takes the Keflex. His weakness has improved now compared to prior. He currently denies any dysuria or hematuria.  He did have fatigue, low-grade fever, and hypotension when he went to ER.  Of note, he was able to move his left hand and was able to walk short sentences in the home, which he is not able to do now.  Denies any worsening of sensory deficit currently. ? ?Past Medical History:  ?Diagnosis Date  ? Acute CVA (cerebrovascular accident) ([place]) 09/09/2020  ? Chronic anticoagulation 11/14/2014  ? CKD (chronic kidney disease) stage 3, GFR 30-59 ml/min (HCC) 10/24/2019  ? CKD (chronic kidney disease) stage 3, GFR 30-59 ml/min (HCC) 10/24/2019  ? Clotting disorder ([place])   ? 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 Likely secondary to poor compliance  ? DVT (deep venous thrombosis) ([place]) 06/15/2011  ? DVT of leg (deep venous thrombosis) (H[facility])   ? rt. leg  ? Essential hypertension 11/28/2020  ? H/O PE (pulmonary embolism) 06/15/2011  ? Hyperlipidemia 11/28/2020  ? 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 Prediabetes 10/24/2019  ? Pulmonary embolism ([place])  06/15/2011  ? 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 Thyroid nodule 01/25/2021  ? Transient ischemic attack 09/07/2020  ? ? ?Past Surgical History:  ?Procedure Laterality Date  ? COLONOSCOPY WITH PROPOFOL N/A 10/29/2015  ? incomplete due to redundant colon. two simple tubular adenomas removed. patient was advised to go to [facility] for colonoscopy but he did not keep appt.  ? IR CT HEAD LTD  02/02/2021  ? IR PERCUTANEOUS ART THROMBECTOMY/INFUSION INTRACRANIAL INC DIAG ANGIO  02/02/2021  ? RADIOLOGY WITH ANESTHESIA N/A 02/02/2021  ? Procedure: IR WITH ANESTHESIA;  Surgeon: Luanne Bras, MD;  Location: [facility];  Service: Radiology;  Laterality: N/A;  ? TEE WITHOUT CARDIOVERSION N/A 09/10/2020  ? Procedure: TRANSESOPHAGEAL ECHOCARDIOGRAM (TEE) WITH PROPOFOL;  Surgeon: Satira Sark, MD;  Location: [facility];  Service: Cardiovascular;  Laterality: N/A;  ? ? ?Family History  ?Problem Relation Age of Onset  ? COPD Father   ? Stroke Brother   ? Pulmonary embolism Sister   ? Throat cancer Brother   ? Arthritis Mother   ? Diabetes Mother   ? Stroke Mother   ? Hypertension Brother   ? Diabetes Brother   ? Arthritis Brother   ? Diabetes Sister   ? Colon cancer Neg Hx   ? ? ?Social History  ? ?Socioeconomic  History  ? Marital status: Divorced  ?  Spouse name: Not on file  ? Number of children: 2  ? Years of education: 41  ? Highest education level: Not on file  ?Occupational History  ? Occupation: disability  ?  Comment: DVT  ?Tobacco Use  ? Smoking status: Former  ?  Packs/day: 1.00  ?  Years: 30.00  ?  Pack years: 30.00  ?  Types: Cigarettes  ?  Start date: 12/01/1983  ? Smokeless tobacco: Never  ?Vaping Use  ? Vaping Use: Never used  ?Substance and Sexual Activity  ? Alcohol use: Not Currently  ?  Alcohol/week: 3.0 standard drinks  ?  Types: 3 Cans of beer per week  ?  Comment: has not drank sice 11/27/20  ? Drug use: Not Currently  ?  Comment: REMOTE HISTORY, Quit in 2007  ? Sexual activity: Yes  ?Other Topics Concern   ? Not on file  ?Social History Narrative  ? Lives with sister  ? 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  ? Right Handed   ? Drinks caffeine on occassion  ? ?Social Determinants of Health  ? ?Financial Resource Strain: Low Risk   ? Difficulty of Paying Living Expenses: Not hard at all  ?Food Insecurity: No Food Insecurity  ? Worried About Charity fundraiser in the Last Year: Never true  ? Ran Out of Food in the Last Year: Never true  ?Transportation Needs: No Transportation Needs  ? Lack of Transportation (Medical): No  ? Lack of Transportation (Non-Medical): No  ?Physical Activity: Insufficiently Active  ? Days of Exercise per Week: 5 days  ? Minutes of Exercise per Session: 20 min  ?Stress: No Stress Concern Present  ? Feeling of Stress : Only a little  ?Social Connections: Moderately Isolated  ? Frequency of Communication with Friends and Family: More than three times a week  ? Frequency of Social Gatherings with Friends and Family: More than three times a week  ? Attends Religious Services: 1 to 4 times per year  ? Active Member of Clubs or Organizations: No  ? Attends Archivist Meetings: Never  ? Marital Status: Divorced  ?Intimate Partner Violence: Not At Risk  ? Fear of Current or Ex-Partner: No  ? Emotionally Abused: No  ? Physically Abused: No  ? Sexually Abused: No  ? ? ?Outpatient Medications Prior to Visit  ?Medication Sig Dispense Refill  ? acetaminophen (TYLENOL) 325 MG tablet Take 2 tablets (650 mg total) by mouth every 4 (four) hours as needed for mild pain (or temp > 37.5 C (99.5 F)).    ? amLODipine (NORVASC) 5 MG tablet Take 1 tablet (5 mg total) by mouth daily. 90 tablet 1  ? aspirin EC 81 MG EC tablet Take 1 tablet (81 mg total) by mouth daily. Swallow whole. 30 tablet 11  ? atorvastatin (LIPITOR) 40 MG tablet Take 1 tablet (40 mg total) by mouth daily. 90 tablet 0  ? cephALEXin (KEFLEX) 500 MG capsule Take 1 capsule (500 mg total) by mouth 2 (two) times daily for 7 days. 14  capsule 0  ? levETIRAcetam (KEPPRA) 500 MG tablet Take 1 tablet (500 mg total) by mouth 2 (two) times daily. 180 tablet 3  ? levothyroxine (SYNTHROID) 25 MCG tablet TAKE 1 TABLET BY MOUTH EVERY DAY 90 tablet 0  ? pantoprazole (PROTONIX) 40 MG tablet TAKE 1 TABLET BY MOUTH EVERYDAY AT BEDTIME 90 tablet 1  ? PRADAXA 150 MG CAPS  capsule TAKE 1 CAPSULE BY MOUTH EVERY 12 HOURS 60 capsule 5  ? senna-docusate (SENOKOT-S) 8.6-50 MG tablet Take 1 tablet by mouth at bedtime.    ? tamsulosin (FLOMAX) 0.4 MG CAPS capsule Take 1 capsule (0.4 mg total) by mouth daily after supper. 90 capsule 0  ? lisinopril (ZESTRIL) 10 MG tablet Take 1 tablet (10 mg total) by mouth daily. 90 tablet 1  ? ?No facility-administered medications prior to visit.  ? ? ?No Known Allergies ? ?Review of Systems  ?Constitutional:  Positive for fatigue. Negative for chills and fever.  ?HENT:  Negative for congestion and sore throat.   ?Eyes:  Negative for pain and discharge.  ?Respiratory:  Negative for cough and shortness of breath.   ?Cardiovascular:  Negative for chest pain and palpitations.  ?Gastrointestinal:  Positive for constipation. Negative for diarrhea, nausea and vomiting.  ?Endocrine: Negative for polydipsia and polyuria.  ?Genitourinary:  Negative for dysuria and hematuria.  ?Musculoskeletal:  Positive for arthralgias and neck stiffness. Negative for neck pain.  ?Skin:  Negative for rash.  ?Neurological:  Positive for weakness and numbness. Negative for dizziness and headaches.  ?Psychiatric/Behavioral:  Negative for agitation and behavioral problems.   ? ?   ?Objective:  ?  ?Physical Exam ?Vitals reviewed.  ?Constitutional:   ?   General: He is not in acute distress. ?   Appearance: He is not diaphoretic.  ?   Comments: In wheelchair  ?HENT:  ?   Head: Normocephalic and atraumatic.  ?   Nose: Nose normal.  ?   Mouth/Throat:  ?   Mouth: Mucous membranes are moist.  ?Eyes:  ?   General: No scleral icterus. ?   Extraocular Movements: Extraocular  movements intact.  ?Cardiovascular:  ?   Rate and Rhythm: Normal rate and regular rhythm.  ?   Pulses: Normal pulses.  ?   Heart sounds: Normal heart sounds. No murmur heard. ?Pulmonary:  ?   Breath soun

## 2022-02-27 NOTE — Assessment & Plan Note (Signed)
BP Readings from Last 1 Encounters:  ?02/23/22 92/62  ? ?Well-controlled with amlodipine ?Lisinopril recently had due to hypotension, needs to improve hydration to avoid hypotension ?Counseled for compliance with the medications ?Advised DASH diet ?

## 2022-02-27 NOTE — Assessment & Plan Note (Signed)
ER chart reviewed, including blood and urine tests ?On Keflex for UTI ?

## 2022-03-02 NOTE — Therapy (Incomplete)
?OUTPATIENT PHYSICAL THERAPY NEURO EVALUATION ? ? ?Patient Name: Matthew Wise ?MRN: [id number] ?DOB:05-03-1961, 61 y.o., male ?Today's Date: 03/02/2022 ? ?PCP: Garvin Fila, MD ?REFERRING PROVIDER: Charlett Blake, MD ? ? ? ?Past Medical History:  ?Diagnosis Date  ? Acute CVA (cerebrovascular accident) ([place]) 09/09/2020  ? Chronic anticoagulation 11/14/2014  ? CKD (chronic kidney disease) stage 3, GFR 30-59 ml/min (HCC) 10/24/2019  ? CKD (chronic kidney disease) stage 3, GFR 30-59 ml/min (HCC) 10/24/2019  ? Clotting disorder ([place])   ? 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 Likely secondary to poor compliance  ? DVT (deep venous thrombosis) ([place]) 06/15/2011  ? DVT of leg (deep venous thrombosis) (HCC)   ? rt. leg  ? Essential hypertension 11/28/2020  ? H/O PE (pulmonary embolism) 06/15/2011  ? Hyperlipidemia 11/28/2020  ? 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 Prediabetes 10/24/2019  ? Pulmonary embolism ([place]) 06/15/2011  ? 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 Thyroid nodule 01/25/2021  ? Transient ischemic attack 09/07/2020  ? ?Past Surgical History:  ?Procedure Laterality Date  ? COLONOSCOPY WITH PROPOFOL N/A 10/29/2015  ? incomplete due to redundant colon. two simple tubular adenomas removed. patient was advised to go to [facility] for colonoscopy but he did not keep appt.  ? IR CT HEAD LTD  02/02/2021  ? IR PERCUTANEOUS ART THROMBECTOMY/INFUSION INTRACRANIAL INC DIAG ANGIO  02/02/2021  ? RADIOLOGY WITH ANESTHESIA N/A 02/02/2021  ? Procedure: IR WITH ANESTHESIA;  Surgeon: Luanne Bras, MD;  Location: [place];  Service: Radiology;  Laterality: N/A;  ? TEE WITHOUT CARDIOVERSION N/A 09/10/2020  ? Procedure: TRANSESOPHAGEAL ECHOCARDIOGRAM (TEE) WITH PROPOFOL;  Surgeon: Satira Sark, MD;  Location: [facility];  Service: Cardiovascular;  Laterality: N/A;  ? ?Patient Active Problem List  ?  Diagnosis Date Noted  ? Encounter for examination following treatment at hospital 02/27/2022  ? BPH (benign prostatic hyperplasia) 09/24/2021  ? History of CVA with residual deficit 09/24/2021  ? Dysphagia, post-stroke   ? Spastic hemiplegia affecting nondominant side ([place])   ? Hemiparesis affecting left side as late effect of stroke ([place])   ? Thyroid nodule 01/25/2021  ? Hypothyroidism 01/25/2021  ? H/O adenomatous polyp of colon 12/30/2020  ? Essential hypertension 11/28/2020  ? Mixed hyperlipidemia 11/28/2020  ? H/O medication noncompliance 09/07/2020  ? Nicotine dependence 12/05/2019  ? Chronic kidney disease, stage 3 unspecified ([place]) 10/24/2019  ? Prediabetes 10/24/2019  ? Tubular adenoma of colon 09/14/2017  ? Long term (current) use of anticoagulants 11/14/2014  ? Sickle cell trait ([place]) 08/29/2012  ? 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 ? ?ONSET DATE: *** ? ?REFERRING DIAG: *** ? ?THERAPY DIAG:  ?No diagnosis found. ? ?SUBJECTIVE:  ?                                                                                                                                                                                           ? ?  SUBJECTIVE STATEMENT: ?*** ?Pt accompanied by: {accompnied:27141} ? ?PERTINENT HISTORY: *** ? ?PAIN:  ?Are you having pain? {OPRCPAIN:27236} ? ?PRECAUTIONS: {Therapy precautions:24002} ? ?WEIGHT BEARING RESTRICTIONS {Yes ***/No:24003} ? ?FALLS: Has patient fallen in last 6 months? {fallsyesno:27318} ? ?LIVING ENVIRONMENT: ?Lives with: {[facility] lives with:25569::"lives with their family"} ?Lives in: {Lives in:25570} ?Stairs: {opstairs:27293} ?Has following equipment at home: {Assistive devices:23999} ? ?PLOF: {PLOF:24004} ? ?PATIENT GOALS *** ? ?OBJECTIVE:  ? ?DIAGNOSTIC FINDINGS: *** ? ?COGNITION: ?Overall cognitive status: {cognition:24006} ?  ?SENSATION: ?{sensation:27233} ? ?COORDINATION: ?*** ? ?EDEMA:   ?{edema:24020} ? ?MUSCLE TONE: {LE tone:25568} ? ? ?MUSCLE LENGTH: ?Hamstrings: Right *** deg; Left *** deg ?Thomas test: Right *** deg; Left *** deg ? ?DTRs:  ?{DTR SITE:24025} ? ?POSTURE: {posture:25561} ? ?LE ROM:    ? ?{AROM/PROM:27142}  Right ?03/02/2022 Left ?03/02/2022  ?Hip flexion    ?Hip extension    ?Hip abduction    ?Hip adduction    ?Hip internal rotation    ?Hip external rotation    ?Knee flexion    ?Knee extension    ?Ankle dorsiflexion    ?Ankle plantarflexion    ?Ankle inversion    ?Ankle eversion    ? (Blank rows = not tested) ? ?MMT:   ? ?MMT Right ?03/02/2022 Left ?03/02/2022  ?Hip flexion    ?Hip extension    ?Hip abduction    ?Hip adduction    ?Hip internal rotation    ?Hip external rotation    ?Knee flexion    ?Knee extension    ?Ankle dorsiflexion    ?Ankle plantarflexion    ?Ankle inversion    ?Ankle eversion    ?(Blank rows = not tested) ? ?BED MOBILITY:  ?{Bed mobility:24027} ? ?TRANSFERS: ?Assistive device utilized: {Assistive devices:23999}  ?Sit to stand: {Levels of assistance:24026} ?Stand to sit: {Levels of assistance:24026} ?Chair to chair: {Levels of assistance:24026} ?Floor: {Levels of assistance:24026} ? ?RAMP:  ?Level of Assistance: {Levels of assistance:24026} ?Assistive device utilized: {Assistive devices:23999} ?Ramp Comments: *** ? ?CURB:  ?Level of Assistance: {Levels of assistance:24026} ?Assistive device utilized: {Assistive devices:23999} ?Curb Comments: *** ? ?STAIRS: ? Level of Assistance: {Levels of assistance:24026} ? Stair Negotiation Technique: {Stair Technique:27161} with {Rail Assistance:27162} ? Number of Stairs: ***  ? Height of Stairs: ***  ?Comments: *** ? ?GAIT: ?Gait pattern: {gait characteristics:25376} ?Distance walked: *** ?Assistive device utilized: {Assistive devices:23999} ?Level of assistance: {Levels of assistance:24026} ?Comments: *** ? ?FUNCTIONAL TESTs:  ?{Functional tests:24029} ? ?PATIENT SURVEYS:  ?{rehab surveys:24030} ? ?TODAY'S TREATMENT:   ?*** ? ? ?PATIENT EDUCATION: ?Education details: *** ?Person educated: {Person educated:25204} ?Education method: {Education Method:25205} ?Education comprehension: {Education Comprehension:25206} ? ? ?HOME EXERCISE PROGRAM: ?*** ? ? ? ?GOALS: ?Goals reviewed with patient? {yes/no:20286} ? ?SHORT TERM GOALS: Target date: {follow up:25551} ? ?*** ?Baseline: ?Goal status: {GOALSTATUS:25110} ? ?2.  *** ?Baseline:  ?Goal status: {GOALSTATUS:25110} ? ?3.  *** ?Baseline:  ?Goal status: {GOALSTATUS:25110} ? ?4.  *** ?Baseline:  ?Goal status: {GOALSTATUS:25110} ? ?5.  *** ?Baseline:  ?Goal status: {GOALSTATUS:25110} ? ?6.  *** ?Baseline:  ?Goal status: {GOALSTATUS:25110} ? ?LONG TERM GOALS: Target date: {follow up:25551} ? ?*** ?Baseline:  ?Goal status: {GOALSTATUS:25110} ? ?2.  *** ?Baseline:  ?Goal status: {GOALSTATUS:25110} ? ?3.  *** ?Baseline:  ?Goal status: {GOALSTATUS:25110} ? ?4.  *** ?Baseline:  ?Goal status: {GOALSTATUS:25110} ? ?5.  *** ?Baseline:  ?Goal status: {GOALSTATUS:25110} ? ?6.  *** ?Baseline:  ?Goal status: {GOALSTATUS:25110} ? ?ASSESSMENT: ? ?CLINICAL IMPRESSION: ?Patient is a *** y.o. *** who was seen today for physical therapy  evaluation and treatment for ***.  ? ? ?OBJECTIVE IMPAIRMENTS {opptimpairments:25111}.  ? ?ACTIVITY LIMITATIONS {activity limitations:25113}.  ? ?PERSONAL FACTORS {Personal factors:25162} are also affecting patient's functional outcome.  ? ? ?REHAB POTENTIAL: {rehabpotential:25112} ? ?CLINICAL DECISION MAKING: {clinical decision making:25114} ? ?EVALUATION COMPLEXITY: {Evaluation complexity:25115} ? ?PLAN: ?PT FREQUENCY: {rehab frequency:25116} ? ?PT DURATION: {rehab duration:25117} ? ?PLANNED INTERVENTIONS: {rehab planned interventions:25118::"Therapeutic exercises","Therapeutic activity","Neuromuscular re-education","Balance training","Gait training","Patient/Family education","Joint mobilization"} ? ?PLAN FOR NEXT SESSION: *** ? ? ?Ardis Rowan, PT ?03/02/2022, 5:09  PM ? ? ? ? ? ? ? ?

## 2022-03-03 ENCOUNTER — Ambulatory Visit (HOSPITAL_COMMUNITY): Payer: Medicare Other

## 2022-03-05 ENCOUNTER — Encounter: Payer: Medicare Other | Admitting: Physical Medicine & Rehabilitation

## 2022-03-06 ENCOUNTER — Other Ambulatory Visit: Payer: Self-pay | Admitting: Internal Medicine

## 2022-03-06 ENCOUNTER — Other Ambulatory Visit (INDEPENDENT_AMBULATORY_CARE_PROVIDER_SITE_OTHER): Payer: Self-pay | Admitting: Nurse Practitioner

## 2022-03-06 DIAGNOSIS — E038 Other specified hypothyroidism: Secondary | ICD-10-CM

## 2022-03-06 DIAGNOSIS — R7303 Prediabetes: Secondary | ICD-10-CM

## 2022-03-06 DIAGNOSIS — Z1211 Encounter for screening for malignant neoplasm of colon: Secondary | ICD-10-CM

## 2022-03-06 DIAGNOSIS — N1831 Chronic kidney disease, stage 3a: Secondary | ICD-10-CM

## 2022-03-06 DIAGNOSIS — Z8673 Personal history of transient ischemic attack (TIA), and cerebral infarction without residual deficits: Secondary | ICD-10-CM

## 2022-03-06 DIAGNOSIS — I10 Essential (primary) hypertension: Secondary | ICD-10-CM

## 2022-03-06 DIAGNOSIS — D6851 Activated protein C resistance: Secondary | ICD-10-CM

## 2022-03-06 DIAGNOSIS — Z7901 Long term (current) use of anticoagulants: Secondary | ICD-10-CM

## 2022-03-09 ENCOUNTER — Institutional Professional Consult (permissible substitution): Payer: Commercial Managed Care - HMO | Admitting: Neurology

## 2022-03-09 ENCOUNTER — Other Ambulatory Visit: Payer: Self-pay | Admitting: *Deleted

## 2022-03-09 ENCOUNTER — Telehealth: Payer: Self-pay

## 2022-03-09 DIAGNOSIS — E038 Other specified hypothyroidism: Secondary | ICD-10-CM

## 2022-03-09 MED ORDER — LEVOTHYROXINE SODIUM 25 MCG PO TABS: 25.0000 ug | ORAL_TABLET | Freq: Every day | ORAL | 0 refills | Status: DC

## 2022-03-09 NOTE — Telephone Encounter (Signed)
Need med refill ?levothyroxine (SYNTHROID) 25 MCG tablet   ? ?atorvastatin (LIPITOR) 40 MG tablet  ? ? ?tamsulosin (FLOMAX) 0.4 MG CAPS capsule  ? ? ?Pharmacy: [facility] ?

## 2022-03-09 NOTE — Telephone Encounter (Signed)
Medication sent to pharmacy  

## 2022-03-11 ENCOUNTER — Encounter (HOSPITAL_COMMUNITY): Payer: Medicare Other

## 2022-03-18 DIAGNOSIS — Z87891 Personal history of nicotine dependence: Secondary | ICD-10-CM | POA: Diagnosis not present

## 2022-03-18 DIAGNOSIS — I129 Hypertensive chronic kidney disease with stage 1 through stage 4 chronic kidney disease, or unspecified chronic kidney disease: Secondary | ICD-10-CM | POA: Diagnosis not present

## 2022-03-18 DIAGNOSIS — E039 Hypothyroidism, unspecified: Secondary | ICD-10-CM | POA: Diagnosis not present

## 2022-03-18 DIAGNOSIS — Z86711 Personal history of pulmonary embolism: Secondary | ICD-10-CM | POA: Diagnosis not present

## 2022-03-18 DIAGNOSIS — N1831 Chronic kidney disease, stage 3a: Secondary | ICD-10-CM | POA: Diagnosis not present

## 2022-03-18 DIAGNOSIS — Z993 Dependence on wheelchair: Secondary | ICD-10-CM | POA: Diagnosis not present

## 2022-03-18 DIAGNOSIS — I82221 Chronic embolism and thrombosis of inferior vena cava: Secondary | ICD-10-CM | POA: Diagnosis not present

## 2022-03-18 DIAGNOSIS — R7303 Prediabetes: Secondary | ICD-10-CM | POA: Diagnosis not present

## 2022-03-18 DIAGNOSIS — K5909 Other constipation: Secondary | ICD-10-CM | POA: Diagnosis not present

## 2022-03-18 DIAGNOSIS — Z7982 Long term (current) use of aspirin: Secondary | ICD-10-CM | POA: Diagnosis not present

## 2022-03-18 DIAGNOSIS — Z86718 Personal history of other venous thrombosis and embolism: Secondary | ICD-10-CM | POA: Diagnosis not present

## 2022-03-18 DIAGNOSIS — Z7902 Long term (current) use of antithrombotics/antiplatelets: Secondary | ICD-10-CM | POA: Diagnosis not present

## 2022-03-18 DIAGNOSIS — E785 Hyperlipidemia, unspecified: Secondary | ICD-10-CM | POA: Diagnosis not present

## 2022-03-18 DIAGNOSIS — I69354 Hemiplegia and hemiparesis following cerebral infarction affecting left non-dominant side: Secondary | ICD-10-CM | POA: Diagnosis not present

## 2022-03-18 DIAGNOSIS — Z87440 Personal history of urinary (tract) infections: Secondary | ICD-10-CM | POA: Diagnosis not present

## 2022-03-23 DIAGNOSIS — Z993 Dependence on wheelchair: Secondary | ICD-10-CM | POA: Diagnosis not present

## 2022-03-23 DIAGNOSIS — R7303 Prediabetes: Secondary | ICD-10-CM | POA: Diagnosis not present

## 2022-03-23 DIAGNOSIS — Z87440 Personal history of urinary (tract) infections: Secondary | ICD-10-CM | POA: Diagnosis not present

## 2022-03-23 DIAGNOSIS — N1831 Chronic kidney disease, stage 3a: Secondary | ICD-10-CM | POA: Diagnosis not present

## 2022-03-23 DIAGNOSIS — Z7902 Long term (current) use of antithrombotics/antiplatelets: Secondary | ICD-10-CM | POA: Diagnosis not present

## 2022-03-23 DIAGNOSIS — K5909 Other constipation: Secondary | ICD-10-CM | POA: Diagnosis not present

## 2022-03-23 DIAGNOSIS — I129 Hypertensive chronic kidney disease with stage 1 through stage 4 chronic kidney disease, or unspecified chronic kidney disease: Secondary | ICD-10-CM | POA: Diagnosis not present

## 2022-03-23 DIAGNOSIS — Z86718 Personal history of other venous thrombosis and embolism: Secondary | ICD-10-CM | POA: Diagnosis not present

## 2022-03-23 DIAGNOSIS — Z7982 Long term (current) use of aspirin: Secondary | ICD-10-CM | POA: Diagnosis not present

## 2022-03-23 DIAGNOSIS — E039 Hypothyroidism, unspecified: Secondary | ICD-10-CM | POA: Diagnosis not present

## 2022-03-23 DIAGNOSIS — Z86711 Personal history of pulmonary embolism: Secondary | ICD-10-CM | POA: Diagnosis not present

## 2022-03-23 DIAGNOSIS — E785 Hyperlipidemia, unspecified: Secondary | ICD-10-CM | POA: Diagnosis not present

## 2022-03-23 DIAGNOSIS — Z87891 Personal history of nicotine dependence: Secondary | ICD-10-CM | POA: Diagnosis not present

## 2022-03-23 DIAGNOSIS — I82221 Chronic embolism and thrombosis of inferior vena cava: Secondary | ICD-10-CM | POA: Diagnosis not present

## 2022-03-23 DIAGNOSIS — I69354 Hemiplegia and hemiparesis following cerebral infarction affecting left non-dominant side: Secondary | ICD-10-CM | POA: Diagnosis not present

## 2022-03-24 DIAGNOSIS — E039 Hypothyroidism, unspecified: Secondary | ICD-10-CM | POA: Diagnosis not present

## 2022-03-24 DIAGNOSIS — Z86718 Personal history of other venous thrombosis and embolism: Secondary | ICD-10-CM | POA: Diagnosis not present

## 2022-03-24 DIAGNOSIS — R7303 Prediabetes: Secondary | ICD-10-CM | POA: Diagnosis not present

## 2022-03-24 DIAGNOSIS — Z87440 Personal history of urinary (tract) infections: Secondary | ICD-10-CM | POA: Diagnosis not present

## 2022-03-24 DIAGNOSIS — N1831 Chronic kidney disease, stage 3a: Secondary | ICD-10-CM | POA: Diagnosis not present

## 2022-03-24 DIAGNOSIS — K5909 Other constipation: Secondary | ICD-10-CM | POA: Diagnosis not present

## 2022-03-24 DIAGNOSIS — Z7982 Long term (current) use of aspirin: Secondary | ICD-10-CM | POA: Diagnosis not present

## 2022-03-24 DIAGNOSIS — I82221 Chronic embolism and thrombosis of inferior vena cava: Secondary | ICD-10-CM | POA: Diagnosis not present

## 2022-03-24 DIAGNOSIS — I129 Hypertensive chronic kidney disease with stage 1 through stage 4 chronic kidney disease, or unspecified chronic kidney disease: Secondary | ICD-10-CM | POA: Diagnosis not present

## 2022-03-24 DIAGNOSIS — E785 Hyperlipidemia, unspecified: Secondary | ICD-10-CM | POA: Diagnosis not present

## 2022-03-24 DIAGNOSIS — Z87891 Personal history of nicotine dependence: Secondary | ICD-10-CM | POA: Diagnosis not present

## 2022-03-24 DIAGNOSIS — Z993 Dependence on wheelchair: Secondary | ICD-10-CM | POA: Diagnosis not present

## 2022-03-24 DIAGNOSIS — I69354 Hemiplegia and hemiparesis following cerebral infarction affecting left non-dominant side: Secondary | ICD-10-CM | POA: Diagnosis not present

## 2022-03-24 DIAGNOSIS — Z86711 Personal history of pulmonary embolism: Secondary | ICD-10-CM | POA: Diagnosis not present

## 2022-03-24 DIAGNOSIS — Z7902 Long term (current) use of antithrombotics/antiplatelets: Secondary | ICD-10-CM | POA: Diagnosis not present

## 2022-03-25 ENCOUNTER — Ambulatory Visit: Payer: Medicare Other | Admitting: Internal Medicine

## 2022-03-25 ENCOUNTER — Encounter: Payer: Self-pay | Admitting: Internal Medicine

## 2022-03-25 VITALS — BP 118/72 | Resp 18 | Ht 71.5 in

## 2022-03-25 DIAGNOSIS — G8110 Spastic hemiplegia affecting unspecified side: Secondary | ICD-10-CM | POA: Diagnosis not present

## 2022-03-25 DIAGNOSIS — J3089 Other allergic rhinitis: Secondary | ICD-10-CM | POA: Diagnosis not present

## 2022-03-25 DIAGNOSIS — N1831 Chronic kidney disease, stage 3a: Secondary | ICD-10-CM | POA: Diagnosis not present

## 2022-03-25 DIAGNOSIS — I10 Essential (primary) hypertension: Secondary | ICD-10-CM | POA: Diagnosis not present

## 2022-03-25 DIAGNOSIS — E039 Hypothyroidism, unspecified: Secondary | ICD-10-CM

## 2022-03-25 MED ORDER — CETIRIZINE HCL 10 MG PO TABS: 10.0000 mg | ORAL_TABLET | Freq: Every day | ORAL | 11 refills | Status: DC

## 2022-03-25 NOTE — Assessment & Plan Note (Addendum)
Lab Results  ?Component Value Date  ? TSH 2.600 09/24/2021  ? ?On Levothyroxine 25 mcg QD ?Check TSH and free T4 ?

## 2022-03-25 NOTE — Assessment & Plan Note (Signed)
Used to follow-up with Dr. Theador Hawthorne, needs to schedule appointment ?ACEi recently held due to AKI -plan to add it back if his kidney function is stable now ?Avoid nephrotoxic agents ?Maintain adequate hydration ?Check BMP today ?

## 2022-03-25 NOTE — Progress Notes (Signed)
? ?Established Patient Office Visit ? ?Subjective:  ?Patient ID: Matthew Wise, male    DOB: 11/25/1961  Age: 61 y.o. MRN: [id number] ? ?CC:  ?Chief Complaint  ?Patient presents with  ? Follow-up  ?  4 week follow up   ? ? ?HPI ?Matthew Wise is a 61 y.o. 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 ? ?HTN: BP is well controlled now.  He is taking amlodipine 5 mg daily and has stopped taking lisinopril due to recent episode of AKI.  He denies any headache, dizziness, chest pain or palpitations. ? ?CKD: He had AKI on CKD recently, likely in the setting of dehydration from UTI.  He currently denies any dysuria, hematuria, urinary hesitancy or resistance.  He has not followed up with Dr. Theador Hawthorne yet. ? ?H/o CVA: His weakness of the left UE and LE have improved since the last visit.  He is able to walk short distances in the home with the support. ? ?He complains of chronic nasal congestion, rhinorrhea and postnasal drip.  Denies any fever, chills, dyspnea or wheezing currently.  He attributes it to environmental allergies. ? ?Past Medical History:  ?Diagnosis Date  ? Acute CVA (cerebrovascular accident) ([place]) 09/09/2020  ? Chronic anticoagulation 11/14/2014  ? CKD (chronic kidney disease) stage 3, GFR 30-59 ml/min (HCC) 10/24/2019  ? CKD (chronic kidney disease) stage 3, GFR 30-59 ml/min (HCC) 10/24/2019  ? Clotting disorder ([place])   ? 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 Likely secondary to poor compliance  ? DVT (deep venous thrombosis) ([place]) 06/15/2011  ? DVT of leg (deep venous thrombosis) (HCC)   ? rt. leg  ? Essential hypertension 11/28/2020  ? H/O PE (pulmonary embolism) 06/15/2011  ? Hyperlipidemia 11/28/2020  ? 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 Middle  cerebral artery embolism, right 02/03/2021  ? Prediabetes 10/24/2019  ? Pulmonary embolism ([place]) 06/15/2011  ? 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 Thyroid nodule 01/25/2021  ? Transient ischemic attack 09/07/2020  ? ? ?Past Surgical History:  ?Procedure Laterality Date  ? COLONOSCOPY WITH PROPOFOL N/A 10/29/2015  ? incomplete due to redundant colon. two simple tubular adenomas removed. patient was advised to go to [facility] for colonoscopy but he did not keep appt.  ? IR CT HEAD LTD  02/02/2021  ? IR PERCUTANEOUS ART THROMBECTOMY/INFUSION INTRACRANIAL INC DIAG ANGIO  02/02/2021  ? RADIOLOGY WITH ANESTHESIA N/A 02/02/2021  ? Procedure: IR WITH ANESTHESIA;  Surgeon: Luanne Bras, MD;  Location: [place];  Service: Radiology;  Laterality: N/A;  ? TEE WITHOUT CARDIOVERSION N/A 09/10/2020  ? Procedure: TRANSESOPHAGEAL ECHOCARDIOGRAM (TEE) WITH PROPOFOL;  Surgeon: Satira Sark, MD;  Location: [facility];  Service: Cardiovascular;  Laterality: N/A;  ? ? ?Family History  ?Problem Relation Age of Onset  ? COPD Father   ? Stroke Brother   ? Pulmonary embolism Sister   ? Throat cancer Brother   ? Arthritis Mother   ? Diabetes Mother   ? Stroke Mother   ? Hypertension Brother   ? Diabetes Brother   ? Arthritis Brother   ? Diabetes Sister   ? Colon cancer Neg Hx   ? ? ?Social History  ? ?Socioeconomic History  ? Marital status: Divorced  ?  Spouse name: Not on  file  ? Number of children: 2  ? Years of education: 62  ? Highest education level: Not on file  ?Occupational History  ? Occupation: disability  ?  Comment: DVT  ?Tobacco Use  ? Smoking status: Former  ?  Packs/day: 1.00  ?  Years: 30.00  ?  Pack years: 30.00  ?  Types: Cigarettes  ?  Start date: 12/01/1983  ? Smokeless tobacco: Never  ?Vaping Use  ? Vaping Use: Never used  ?Substance and Sexual Activity  ? Alcohol use: Not Currently  ?  Alcohol/week: 3.0 standard drinks  ?  Types: 3 Cans of beer per week  ?  Comment: has not drank sice 11/27/20  ? Drug use: Not  Currently  ?  Comment: REMOTE HISTORY, Quit in 2007  ? Sexual activity: Yes  ?Other Topics Concern  ? Not on file  ?Social History Narrative  ? Lives with sister  ? 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  ? Right Handed   ? Drinks caffeine on occassion  ? ?Social Determinants of Health  ? ?Financial Resource Strain: Low Risk   ? Difficulty of Paying Living Expenses: Not hard at all  ?Food Insecurity: No Food Insecurity  ? Worried About Charity fundraiser in the Last Year: Never true  ? Ran Out of Food in the Last Year: Never true  ?Transportation Needs: No Transportation Needs  ? Lack of Transportation (Medical): No  ? Lack of Transportation (Non-Medical): No  ?Physical Activity: Insufficiently Active  ? Days of Exercise per Week: 5 days  ? Minutes of Exercise per Session: 20 min  ?Stress: No Stress Concern Present  ? Feeling of Stress : Only a little  ?Social Connections: Moderately Isolated  ? Frequency of Communication with Friends and Family: More than three times a week  ? Frequency of Social Gatherings with Friends and Family: More than three times a week  ? Attends Religious Services: 1 to 4 times per year  ? Active Member of Clubs or Organizations: No  ? Attends Archivist Meetings: Never  ? Marital Status: Divorced  ?Intimate Partner Violence: Not At Risk  ? Fear of Current or Ex-Partner: No  ? Emotionally Abused: No  ? Physically Abused: No  ? Sexually Abused: No  ? ? ?Outpatient Medications Prior to Visit  ?Medication Sig Dispense Refill  ? acetaminophen (TYLENOL) 325 MG tablet Take 2 tablets (650 mg total) by mouth every 4 (four) hours as needed for mild pain (or temp > 37.5 C (99.5 F)).    ? amLODipine (NORVASC) 5 MG tablet Take 1 tablet (5 mg total) by mouth daily. 90 tablet 1  ? aspirin EC 81 MG EC tablet Take 1 tablet (81 mg total) by mouth daily. Swallow whole. 30 tablet 11  ? atorvastatin (LIPITOR) 40 MG tablet Take 1 tablet by mouth once daily 90 tablet 0  ? levETIRAcetam (KEPPRA) 500  MG tablet Take 1 tablet (500 mg total) by mouth 2 (two) times daily. 180 tablet 3  ? levothyroxine (SYNTHROID) 25 MCG tablet Take 1 tablet (25 mcg total) by mouth daily. 90 tablet 0  ? pantoprazole (PROTONIX) 40 MG tablet TAKE 1 TABLET BY MOUTH EVERYDAY AT BEDTIME 90 tablet 1  ? PRADAXA 150 MG CAPS capsule TAKE 1 CAPSULE BY MOUTH EVERY 12 HOURS 60 capsule 5  ? senna-docusate (SENOKOT-S) 8.6-50 MG tablet Take 1 tablet by mouth at bedtime.    ? tamsulosin (FLOMAX) 0.4 MG CAPS capsule TAKE 1  CAPSULE BY MOUTH DAILY AFTER SUPPER 90 capsule 0  ? ?No facility-administered medications prior to visit.  ? ? ?No Known Allergies ? ?ROS ?Review of Systems  ?Constitutional:  Positive for fatigue. Negative for chills and fever.  ?HENT:  Positive for congestion, postnasal drip and rhinorrhea. Negative for sore throat.   ?Eyes:  Negative for pain and discharge.  ?Respiratory:  Negative for cough and shortness of breath.   ?Cardiovascular:  Negative for chest pain and palpitations.  ?Gastrointestinal:  Positive for constipation. Negative for diarrhea, nausea and vomiting.  ?Endocrine: Negative for polydipsia and polyuria.  ?Genitourinary:  Negative for dysuria and hematuria.  ?Musculoskeletal:  Positive for arthralgias and neck stiffness. Negative for neck pain.  ?Skin:  Negative for rash.  ?Neurological:  Positive for weakness and numbness. Negative for dizziness and headaches.  ?Psychiatric/Behavioral:  Negative for agitation and behavioral problems.   ? ?  ?Objective:  ?  ?Physical Exam ?Vitals reviewed.  ?Constitutional:   ?   General: He is not in acute distress. ?   Appearance: He is not diaphoretic.  ?   Comments: In wheelchair  ?HENT:  ?   Head: Normocephalic and atraumatic.  ?   Nose: Congestion present.  ?   Mouth/Throat:  ?   Mouth: Mucous membranes are moist.  ?Eyes:  ?   General: No scleral icterus. ?   Extraocular Movements: Extraocular movements intact.  ?Cardiovascular:  ?   Rate and Rhythm: Normal rate and regular  rhythm.  ?   Pulses: Normal pulses.  ?   Heart sounds: Normal heart sounds. No murmur heard. ?Pulmonary:  ?   Breath sounds: Normal breath sounds. No wheezing or rales.  ?Musculoskeletal:  ?   Cervical back:

## 2022-03-25 NOTE — Patient Instructions (Signed)
Please start taking Zyrtec as prescribed for allergies. Okay to use nasal saline spray for nasal congestion. ? ?Please continue to take other medications as prescribed. ?

## 2022-03-25 NOTE — Assessment & Plan Note (Signed)
Started Zyrtec for allergies ?Advised to use nasal saline spray as needed for nasal congestion ?

## 2022-03-25 NOTE — Assessment & Plan Note (Signed)
CVA in 10/2020 and 01/2021, s/p thrombectomy ?Has residual left-sided hemiplegia and hemiparesis - recent worsening of his left-sided weakness likely due to dehydration from UTI -now improving Wheelchair-bound ?On aspirin, statin and Pradaxa ?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 ?Has not seen cardiology in outpatient setting ?

## 2022-03-25 NOTE — Assessment & Plan Note (Signed)
BP Readings from Last 1 Encounters:  ?03/25/22 118/72  ? ?Well-controlled with amlodipine ?Lisinopril recently had due to hypotension, needs to improve hydration to avoid hypotension, plan to add it back if his AKI is resolved ?Counseled for compliance with the medications ?Advised DASH diet ?

## 2022-03-26 ENCOUNTER — Encounter: Payer: Self-pay | Admitting: Physical Medicine & Rehabilitation

## 2022-03-26 ENCOUNTER — Encounter: Payer: Medicare Other | Attending: Physical Medicine & Rehabilitation | Admitting: Physical Medicine & Rehabilitation

## 2022-03-26 VITALS — BP 129/78 | HR 61 | Ht 71.5 in | Wt 240.4 lb

## 2022-03-26 DIAGNOSIS — G8110 Spastic hemiplegia affecting unspecified side: Secondary | ICD-10-CM | POA: Diagnosis not present

## 2022-03-26 LAB — BASIC METABOLIC PANEL
BUN/Creatinine Ratio: 9 (ref 9–20)
BUN: 14 mg/dL (ref 6–24)
CO2: 25 mmol/L (ref 20–29)
Calcium: 9.6 mg/dL (ref 8.7–10.2)
Chloride: 101 mmol/L (ref 96–106)
Creatinine, Ser: 1.5 mg/dL — ABNORMAL HIGH (ref 0.76–1.27)
Glucose: 80 mg/dL (ref 70–99)
Potassium: 4.1 mmol/L (ref 3.5–5.2)
Sodium: 142 mmol/L (ref 134–144)
eGFR: 53 mL/min/{1.73_m2} — ABNORMAL LOW (ref >59)

## 2022-03-26 LAB — TSH+FREE T4
Free T4: 1.55 ng/dL (ref 0.82–1.77)
TSH: 3.84 u[IU]/mL (ref 0.450–4.500)

## 2022-03-26 MED ORDER — LISINOPRIL 2.5 MG PO TABS: 2.5000 mg | ORAL_TABLET | Freq: Every day | ORAL | 1 refills | Status: DC

## 2022-03-26 NOTE — Progress Notes (Signed)
? ?Subjective:  ? ? Patient ID: Matthew Wise, male    DOB: 1961/01/23, 61 y.o.   MRN: [id number] ? 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 ?Left  ?Pec 75 ?Biceps 75 ?Brachialis 75 ?FDS 25 ?FDP 25 ?FPL 25 ?Patient had a ED visit about 1 month after that for increasing spasms in the left upper and left lower limb.  He was diagnosed with UTI at that time.  Patient is asking whether the Botox could have caused some spasms.  We discussed that this is very unlikely. ?Pain Inventory ?Average Pain 0 ?Pain Right Now 0 ?My pain is  no pain ? ?BOWEL ?Number of stools per week: 4 ?Oral laxative use Yes  ?Type of laxative senna ? ?BLADDER ?Normal ? ?Mobility ?walk with assistance ?use a cane ?ability to climb steps?  no ?do you drive?  no ?use a wheelchair ?transfers alone ? ?Function ?disabled: date disabled . ?I need assistance with the following:  dressing, bathing, toileting, meal prep, household duties, and shopping ? ?Neuro/Psych ?bladder control problems ?weakness ?trouble walking ?spasms ? ?Prior Studies ?Any changes since last visit?  no ? ?Physicians involved in your care ?Any changes since last visit?  no ? ? ?Family History  ?Problem Relation Age of Onset  ? COPD Father   ? Stroke Brother   ? Pulmonary embolism Sister   ? Throat cancer Brother   ? Arthritis Mother   ? Diabetes Mother   ? Stroke Mother   ? Hypertension Brother   ? Diabetes Brother   ? Arthritis Brother   ? Diabetes Sister   ? Colon cancer Neg Hx   ? ?Social History  ? ?Socioeconomic History  ? Marital status: Divorced  ?  Spouse name: Not on file  ? Number of children: 2  ? Years of education: 22  ? Highest education level: Not on file  ?Occupational History  ? Occupation: disability  ?  Comment: DVT  ?Tobacco Use  ? Smoking status: Former  ?  Packs/day: 1.00  ?  Years: 30.00  ?  Pack years: 30.00  ?  Types: Cigarettes  ?   Start date: 12/01/1983  ? Smokeless tobacco: Never  ?Vaping Use  ? Vaping Use: Never used  ?Substance and Sexual Activity  ? Alcohol use: Not Currently  ?  Alcohol/week: 3.0 standard drinks  ?  Types: 3 Cans of beer per week  ?  Comment: has not drank sice 11/27/20  ? Drug use: Not Currently  ?  Comment: REMOTE HISTORY, Quit in 2007  ? Sexual activity: Yes  ?Other Topics Concern  ? Not on file  ?Social History Narrative  ? Lives with sister  ? 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  ? Right Handed   ? Drinks caffeine on occassion  ? ?Social Determinants of Health  ? ?Financial Resource Strain: Low Risk   ? Difficulty of Paying Living Expenses: Not hard at all  ?Food Insecurity: No Food Insecurity  ? Worried About Charity fundraiser in the Last Year: Never true  ? Ran Out of Food in the Last Year: Never true  ?Transportation Needs: No Transportation Needs  ? Lack of Transportation (Medical): No  ? Lack of Transportation (Non-Medical): No  ?Physical Activity: Insufficiently Active  ? Days of Exercise per Week: 5 days  ? Minutes of Exercise per Session: 20 min  ?Stress: No Stress  Concern Present  ? Feeling of Stress : Only a little  ?Social Connections: Moderately Isolated  ? Frequency of Communication with Friends and Family: More than three times a week  ? Frequency of Social Gatherings with Friends and Family: More than three times a week  ? Attends Religious Services: 1 to 4 times per year  ? Active Member of Clubs or Organizations: No  ? Attends Archivist Meetings: Never  ? Marital Status: Divorced  ? ?Past Surgical History:  ?Procedure Laterality Date  ? COLONOSCOPY WITH PROPOFOL N/A 10/29/2015  ? incomplete due to redundant colon. two simple tubular adenomas removed. patient was advised to go to [facility] for colonoscopy but he did not keep appt.  ? IR CT HEAD LTD  02/02/2021  ? IR PERCUTANEOUS ART THROMBECTOMY/INFUSION INTRACRANIAL INC DIAG ANGIO  02/02/2021  ? RADIOLOGY WITH ANESTHESIA N/A 02/02/2021  ?  Procedure: IR WITH ANESTHESIA;  Surgeon: Luanne Bras, MD;  Location: [place];  Service: Radiology;  Laterality: N/A;  ? TEE WITHOUT CARDIOVERSION N/A 09/10/2020  ? Procedure: TRANSESOPHAGEAL ECHOCARDIOGRAM (TEE) WITH PROPOFOL;  Surgeon: Satira Sark, MD;  Location: [facility];  Service: Cardiovascular;  Laterality: N/A;  ? ?Past Medical History:  ?Diagnosis Date  ? Acute CVA (cerebrovascular accident) ([place]) 09/09/2020  ? Chronic anticoagulation 11/14/2014  ? CKD (chronic kidney disease) stage 3, GFR 30-59 ml/min (HCC) 10/24/2019  ? CKD (chronic kidney disease) stage 3, GFR 30-59 ml/min (HCC) 10/24/2019  ? Clotting disorder ([place])   ? 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 Likely secondary to poor compliance  ? DVT (deep venous thrombosis) ([place]) 06/15/2011  ? DVT of leg (deep venous thrombosis) (H[facility])   ? rt. leg  ? Essential hypertension 11/28/2020  ? H/O PE (pulmonary embolism) 06/15/2011  ? Hyperlipidemia 11/28/2020  ? 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 Prediabetes 10/24/2019  ? Pulmonary embolism ([place]) 06/15/2011  ? 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 Thyroid nodule 01/25/2021  ? Transient ischemic attack 09/07/2020  ? ?BP 129/78   Pulse 61   Ht 5' 11.5" (1.816 m)   Wt 240 lb 6.4 oz (109 kg)   SpO2 99%   BMI 33.06 kg/m?  ? ?Opioid Risk Score:   ?Fall Risk Score:  `1 ? ?Depression screen PHQ 2/9 ? ? ?  03/25/2022  ? 10:39 AM 02/23/2022  ?  8:15 AM 01/05/2022  ? 10:55 AM 10/07/2021  ? 11:15 AM 10/01/2021  ?  3:48 PM 10/01/2021  ?  3:45 PM 09/24/2021  ?  9:20 AM  ?Depression screen PHQ 2/9  ?Decreased Interest 0 0 0 1 0 0 0  ?Down, Depressed, Hopeless 0 1 0 1 0 0 0  ?PHQ - 2 Score 0 1 0 2 0 0 0  ?  ? ?Review of Systems  ?Constitutional: Negative.   ?HENT: Negative.    ?Eyes: Negative.   ?Respiratory: Negative.    ?Cardiovascular: Negative.   ?Gastrointestinal: Negative.   ?Endocrine:  Negative.   ?Genitourinary: Negative.   ?Musculoskeletal:  Positive for gait problem.  ?     Spasms  ?Skin: Negative.   ?All other systems reviewed and are negative. ? ?   ?Objective:  ? Physical Exam ?Vitals and nursing note reviewed.  ?Constitutional:   ?   Appearance: He is normal weight.  ?Eyes:  ?   Extraocular Movements: Extraocular movements intact.  ?  Conjunctiva/sclera: Conjunctivae normal.  ?   Pupils: Pupils are equal, round, and reactive to light.  ?Skin: ?   General: Skin is warm and dry.  ?Neurological:  ?   Mental Status: He is alert and oriented to person, place, and time.  ?   Comments: Motor strength is to minus at the left deltoid bicep tricep trace finger flexors 0 finger extensors ?Left lower extremity to minus hip flexors 3 - knee extensors trace ankle dorsiflexor ?Tone ?MAS 2 at the elbow flexors MAS 1 at the wrist flexors MAS 1 at the finger flexors  ?Psychiatric:     ?   Mood and Affect: Mood normal.  ? ? ? ? ? ?   ?Assessment & Plan:  ?1.  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  Would recommend repeating in 6 weeks, would continue with the same dosing. ?Left  ?Pec 75 ?Biceps 75 ?Brachialis 75 ?FDS 25 ?FDP 25 ?FPL 25 ?

## 2022-03-26 NOTE — Addendum Note (Signed)
Addended byIhor Dow on: 03/26/2022 08:17 AM ? ? Modules accepted: Orders ? ?

## 2022-03-26 NOTE — Patient Instructions (Signed)
Repeat Botox with same muscles and doses ? ?Do not think shaking episode related to Botox, suspect the UTI was responsible  ?

## 2022-03-27 LAB — MICROALBUMIN / CREATININE URINE RATIO
Creatinine, Urine: 113.6 mg/dL
Microalb/Creat Ratio: 34 mg/g creat — ABNORMAL HIGH (ref 0–29)
Microalbumin, Urine: 38.5 ug/mL

## 2022-03-30 DIAGNOSIS — I82221 Chronic embolism and thrombosis of inferior vena cava: Secondary | ICD-10-CM | POA: Diagnosis not present

## 2022-03-30 DIAGNOSIS — E039 Hypothyroidism, unspecified: Secondary | ICD-10-CM | POA: Diagnosis not present

## 2022-03-30 DIAGNOSIS — N1831 Chronic kidney disease, stage 3a: Secondary | ICD-10-CM | POA: Diagnosis not present

## 2022-03-30 DIAGNOSIS — Z7982 Long term (current) use of aspirin: Secondary | ICD-10-CM | POA: Diagnosis not present

## 2022-03-30 DIAGNOSIS — Z87440 Personal history of urinary (tract) infections: Secondary | ICD-10-CM | POA: Diagnosis not present

## 2022-03-30 DIAGNOSIS — I129 Hypertensive chronic kidney disease with stage 1 through stage 4 chronic kidney disease, or unspecified chronic kidney disease: Secondary | ICD-10-CM | POA: Diagnosis not present

## 2022-03-30 DIAGNOSIS — Z86711 Personal history of pulmonary embolism: Secondary | ICD-10-CM | POA: Diagnosis not present

## 2022-03-30 DIAGNOSIS — R7303 Prediabetes: Secondary | ICD-10-CM | POA: Diagnosis not present

## 2022-03-30 DIAGNOSIS — Z993 Dependence on wheelchair: Secondary | ICD-10-CM | POA: Diagnosis not present

## 2022-03-30 DIAGNOSIS — K5909 Other constipation: Secondary | ICD-10-CM | POA: Diagnosis not present

## 2022-03-30 DIAGNOSIS — Z7902 Long term (current) use of antithrombotics/antiplatelets: Secondary | ICD-10-CM | POA: Diagnosis not present

## 2022-03-30 DIAGNOSIS — E785 Hyperlipidemia, unspecified: Secondary | ICD-10-CM | POA: Diagnosis not present

## 2022-03-30 DIAGNOSIS — I69354 Hemiplegia and hemiparesis following cerebral infarction affecting left non-dominant side: Secondary | ICD-10-CM | POA: Diagnosis not present

## 2022-03-30 DIAGNOSIS — Z86718 Personal history of other venous thrombosis and embolism: Secondary | ICD-10-CM | POA: Diagnosis not present

## 2022-03-30 DIAGNOSIS — Z87891 Personal history of nicotine dependence: Secondary | ICD-10-CM | POA: Diagnosis not present

## 2022-04-01 DIAGNOSIS — Z86718 Personal history of other venous thrombosis and embolism: Secondary | ICD-10-CM | POA: Diagnosis not present

## 2022-04-01 DIAGNOSIS — I129 Hypertensive chronic kidney disease with stage 1 through stage 4 chronic kidney disease, or unspecified chronic kidney disease: Secondary | ICD-10-CM | POA: Diagnosis not present

## 2022-04-01 DIAGNOSIS — Z7982 Long term (current) use of aspirin: Secondary | ICD-10-CM | POA: Diagnosis not present

## 2022-04-01 DIAGNOSIS — I69354 Hemiplegia and hemiparesis following cerebral infarction affecting left non-dominant side: Secondary | ICD-10-CM | POA: Diagnosis not present

## 2022-04-01 DIAGNOSIS — E039 Hypothyroidism, unspecified: Secondary | ICD-10-CM | POA: Diagnosis not present

## 2022-04-01 DIAGNOSIS — Z7902 Long term (current) use of antithrombotics/antiplatelets: Secondary | ICD-10-CM | POA: Diagnosis not present

## 2022-04-01 DIAGNOSIS — K5909 Other constipation: Secondary | ICD-10-CM | POA: Diagnosis not present

## 2022-04-01 DIAGNOSIS — I82221 Chronic embolism and thrombosis of inferior vena cava: Secondary | ICD-10-CM | POA: Diagnosis not present

## 2022-04-01 DIAGNOSIS — Z87891 Personal history of nicotine dependence: Secondary | ICD-10-CM | POA: Diagnosis not present

## 2022-04-01 DIAGNOSIS — Z87440 Personal history of urinary (tract) infections: Secondary | ICD-10-CM | POA: Diagnosis not present

## 2022-04-01 DIAGNOSIS — Z993 Dependence on wheelchair: Secondary | ICD-10-CM | POA: Diagnosis not present

## 2022-04-01 DIAGNOSIS — Z86711 Personal history of pulmonary embolism: Secondary | ICD-10-CM | POA: Diagnosis not present

## 2022-04-01 DIAGNOSIS — N1831 Chronic kidney disease, stage 3a: Secondary | ICD-10-CM | POA: Diagnosis not present

## 2022-04-01 DIAGNOSIS — R7303 Prediabetes: Secondary | ICD-10-CM | POA: Diagnosis not present

## 2022-04-01 DIAGNOSIS — E785 Hyperlipidemia, unspecified: Secondary | ICD-10-CM | POA: Diagnosis not present

## 2022-04-07 DIAGNOSIS — N1831 Chronic kidney disease, stage 3a: Secondary | ICD-10-CM | POA: Diagnosis not present

## 2022-04-07 DIAGNOSIS — R7303 Prediabetes: Secondary | ICD-10-CM | POA: Diagnosis not present

## 2022-04-07 DIAGNOSIS — Z7902 Long term (current) use of antithrombotics/antiplatelets: Secondary | ICD-10-CM | POA: Diagnosis not present

## 2022-04-07 DIAGNOSIS — I82221 Chronic embolism and thrombosis of inferior vena cava: Secondary | ICD-10-CM | POA: Diagnosis not present

## 2022-04-07 DIAGNOSIS — Z86718 Personal history of other venous thrombosis and embolism: Secondary | ICD-10-CM | POA: Diagnosis not present

## 2022-04-07 DIAGNOSIS — Z993 Dependence on wheelchair: Secondary | ICD-10-CM | POA: Diagnosis not present

## 2022-04-07 DIAGNOSIS — I69354 Hemiplegia and hemiparesis following cerebral infarction affecting left non-dominant side: Secondary | ICD-10-CM | POA: Diagnosis not present

## 2022-04-07 DIAGNOSIS — Z86711 Personal history of pulmonary embolism: Secondary | ICD-10-CM | POA: Diagnosis not present

## 2022-04-07 DIAGNOSIS — E785 Hyperlipidemia, unspecified: Secondary | ICD-10-CM | POA: Diagnosis not present

## 2022-04-07 DIAGNOSIS — K5909 Other constipation: Secondary | ICD-10-CM | POA: Diagnosis not present

## 2022-04-07 DIAGNOSIS — E039 Hypothyroidism, unspecified: Secondary | ICD-10-CM | POA: Diagnosis not present

## 2022-04-07 DIAGNOSIS — Z87440 Personal history of urinary (tract) infections: Secondary | ICD-10-CM | POA: Diagnosis not present

## 2022-04-07 DIAGNOSIS — I129 Hypertensive chronic kidney disease with stage 1 through stage 4 chronic kidney disease, or unspecified chronic kidney disease: Secondary | ICD-10-CM | POA: Diagnosis not present

## 2022-04-07 DIAGNOSIS — Z87891 Personal history of nicotine dependence: Secondary | ICD-10-CM | POA: Diagnosis not present

## 2022-04-07 DIAGNOSIS — Z7982 Long term (current) use of aspirin: Secondary | ICD-10-CM | POA: Diagnosis not present

## 2022-04-09 DIAGNOSIS — Z7902 Long term (current) use of antithrombotics/antiplatelets: Secondary | ICD-10-CM | POA: Diagnosis not present

## 2022-04-09 DIAGNOSIS — Z86711 Personal history of pulmonary embolism: Secondary | ICD-10-CM | POA: Diagnosis not present

## 2022-04-09 DIAGNOSIS — D571 Sickle-cell disease without crisis: Secondary | ICD-10-CM | POA: Diagnosis not present

## 2022-04-09 DIAGNOSIS — Z7982 Long term (current) use of aspirin: Secondary | ICD-10-CM | POA: Diagnosis not present

## 2022-04-09 DIAGNOSIS — Z87440 Personal history of urinary (tract) infections: Secondary | ICD-10-CM | POA: Diagnosis not present

## 2022-04-09 DIAGNOSIS — I82221 Chronic embolism and thrombosis of inferior vena cava: Secondary | ICD-10-CM | POA: Diagnosis not present

## 2022-04-09 DIAGNOSIS — Z86718 Personal history of other venous thrombosis and embolism: Secondary | ICD-10-CM | POA: Diagnosis not present

## 2022-04-09 DIAGNOSIS — R7303 Prediabetes: Secondary | ICD-10-CM | POA: Diagnosis not present

## 2022-04-09 DIAGNOSIS — N400 Enlarged prostate without lower urinary tract symptoms: Secondary | ICD-10-CM

## 2022-04-09 DIAGNOSIS — Z993 Dependence on wheelchair: Secondary | ICD-10-CM | POA: Diagnosis not present

## 2022-04-09 DIAGNOSIS — Z87891 Personal history of nicotine dependence: Secondary | ICD-10-CM | POA: Diagnosis not present

## 2022-04-09 DIAGNOSIS — E039 Hypothyroidism, unspecified: Secondary | ICD-10-CM | POA: Diagnosis not present

## 2022-04-09 DIAGNOSIS — N1831 Chronic kidney disease, stage 3a: Secondary | ICD-10-CM | POA: Diagnosis not present

## 2022-04-09 DIAGNOSIS — K5909 Other constipation: Secondary | ICD-10-CM | POA: Diagnosis not present

## 2022-04-09 DIAGNOSIS — E785 Hyperlipidemia, unspecified: Secondary | ICD-10-CM | POA: Diagnosis not present

## 2022-04-09 DIAGNOSIS — I129 Hypertensive chronic kidney disease with stage 1 through stage 4 chronic kidney disease, or unspecified chronic kidney disease: Secondary | ICD-10-CM | POA: Diagnosis not present

## 2022-04-09 DIAGNOSIS — I69354 Hemiplegia and hemiparesis following cerebral infarction affecting left non-dominant side: Secondary | ICD-10-CM | POA: Diagnosis not present

## 2022-04-10 DIAGNOSIS — E039 Hypothyroidism, unspecified: Secondary | ICD-10-CM | POA: Diagnosis not present

## 2022-04-10 DIAGNOSIS — I82221 Chronic embolism and thrombosis of inferior vena cava: Secondary | ICD-10-CM | POA: Diagnosis not present

## 2022-04-10 DIAGNOSIS — E785 Hyperlipidemia, unspecified: Secondary | ICD-10-CM | POA: Diagnosis not present

## 2022-04-10 DIAGNOSIS — K5909 Other constipation: Secondary | ICD-10-CM | POA: Diagnosis not present

## 2022-04-10 DIAGNOSIS — I69354 Hemiplegia and hemiparesis following cerebral infarction affecting left non-dominant side: Secondary | ICD-10-CM | POA: Diagnosis not present

## 2022-04-10 DIAGNOSIS — Z87891 Personal history of nicotine dependence: Secondary | ICD-10-CM | POA: Diagnosis not present

## 2022-04-10 DIAGNOSIS — N1831 Chronic kidney disease, stage 3a: Secondary | ICD-10-CM | POA: Diagnosis not present

## 2022-04-10 DIAGNOSIS — Z993 Dependence on wheelchair: Secondary | ICD-10-CM | POA: Diagnosis not present

## 2022-04-10 DIAGNOSIS — Z7982 Long term (current) use of aspirin: Secondary | ICD-10-CM | POA: Diagnosis not present

## 2022-04-10 DIAGNOSIS — Z86711 Personal history of pulmonary embolism: Secondary | ICD-10-CM | POA: Diagnosis not present

## 2022-04-10 DIAGNOSIS — Z87440 Personal history of urinary (tract) infections: Secondary | ICD-10-CM | POA: Diagnosis not present

## 2022-04-10 DIAGNOSIS — Z86718 Personal history of other venous thrombosis and embolism: Secondary | ICD-10-CM | POA: Diagnosis not present

## 2022-04-10 DIAGNOSIS — R7303 Prediabetes: Secondary | ICD-10-CM | POA: Diagnosis not present

## 2022-04-10 DIAGNOSIS — I129 Hypertensive chronic kidney disease with stage 1 through stage 4 chronic kidney disease, or unspecified chronic kidney disease: Secondary | ICD-10-CM | POA: Diagnosis not present

## 2022-04-10 DIAGNOSIS — Z7902 Long term (current) use of antithrombotics/antiplatelets: Secondary | ICD-10-CM | POA: Diagnosis not present

## 2022-04-13 DIAGNOSIS — I82221 Chronic embolism and thrombosis of inferior vena cava: Secondary | ICD-10-CM | POA: Diagnosis not present

## 2022-04-13 DIAGNOSIS — Z86711 Personal history of pulmonary embolism: Secondary | ICD-10-CM | POA: Diagnosis not present

## 2022-04-13 DIAGNOSIS — E039 Hypothyroidism, unspecified: Secondary | ICD-10-CM | POA: Diagnosis not present

## 2022-04-13 DIAGNOSIS — I129 Hypertensive chronic kidney disease with stage 1 through stage 4 chronic kidney disease, or unspecified chronic kidney disease: Secondary | ICD-10-CM | POA: Diagnosis not present

## 2022-04-13 DIAGNOSIS — Z86718 Personal history of other venous thrombosis and embolism: Secondary | ICD-10-CM | POA: Diagnosis not present

## 2022-04-13 DIAGNOSIS — R7303 Prediabetes: Secondary | ICD-10-CM | POA: Diagnosis not present

## 2022-04-13 DIAGNOSIS — I69354 Hemiplegia and hemiparesis following cerebral infarction affecting left non-dominant side: Secondary | ICD-10-CM | POA: Diagnosis not present

## 2022-04-13 DIAGNOSIS — Z87440 Personal history of urinary (tract) infections: Secondary | ICD-10-CM | POA: Diagnosis not present

## 2022-04-13 DIAGNOSIS — N1831 Chronic kidney disease, stage 3a: Secondary | ICD-10-CM | POA: Diagnosis not present

## 2022-04-13 DIAGNOSIS — E785 Hyperlipidemia, unspecified: Secondary | ICD-10-CM | POA: Diagnosis not present

## 2022-04-13 DIAGNOSIS — Z7982 Long term (current) use of aspirin: Secondary | ICD-10-CM | POA: Diagnosis not present

## 2022-04-13 DIAGNOSIS — Z87891 Personal history of nicotine dependence: Secondary | ICD-10-CM | POA: Diagnosis not present

## 2022-04-13 DIAGNOSIS — Z7902 Long term (current) use of antithrombotics/antiplatelets: Secondary | ICD-10-CM | POA: Diagnosis not present

## 2022-04-13 DIAGNOSIS — K5909 Other constipation: Secondary | ICD-10-CM | POA: Diagnosis not present

## 2022-04-13 DIAGNOSIS — Z993 Dependence on wheelchair: Secondary | ICD-10-CM | POA: Diagnosis not present

## 2022-04-14 DIAGNOSIS — Z86711 Personal history of pulmonary embolism: Secondary | ICD-10-CM | POA: Diagnosis not present

## 2022-04-14 DIAGNOSIS — N1831 Chronic kidney disease, stage 3a: Secondary | ICD-10-CM | POA: Diagnosis not present

## 2022-04-14 DIAGNOSIS — R7303 Prediabetes: Secondary | ICD-10-CM | POA: Diagnosis not present

## 2022-04-14 DIAGNOSIS — Z7902 Long term (current) use of antithrombotics/antiplatelets: Secondary | ICD-10-CM | POA: Diagnosis not present

## 2022-04-14 DIAGNOSIS — I69354 Hemiplegia and hemiparesis following cerebral infarction affecting left non-dominant side: Secondary | ICD-10-CM | POA: Diagnosis not present

## 2022-04-14 DIAGNOSIS — E785 Hyperlipidemia, unspecified: Secondary | ICD-10-CM | POA: Diagnosis not present

## 2022-04-14 DIAGNOSIS — Z993 Dependence on wheelchair: Secondary | ICD-10-CM | POA: Diagnosis not present

## 2022-04-14 DIAGNOSIS — Z86718 Personal history of other venous thrombosis and embolism: Secondary | ICD-10-CM | POA: Diagnosis not present

## 2022-04-14 DIAGNOSIS — I129 Hypertensive chronic kidney disease with stage 1 through stage 4 chronic kidney disease, or unspecified chronic kidney disease: Secondary | ICD-10-CM | POA: Diagnosis not present

## 2022-04-14 DIAGNOSIS — I82221 Chronic embolism and thrombosis of inferior vena cava: Secondary | ICD-10-CM | POA: Diagnosis not present

## 2022-04-14 DIAGNOSIS — Z87440 Personal history of urinary (tract) infections: Secondary | ICD-10-CM | POA: Diagnosis not present

## 2022-04-14 DIAGNOSIS — Z7982 Long term (current) use of aspirin: Secondary | ICD-10-CM | POA: Diagnosis not present

## 2022-04-14 DIAGNOSIS — E039 Hypothyroidism, unspecified: Secondary | ICD-10-CM | POA: Diagnosis not present

## 2022-04-14 DIAGNOSIS — K5909 Other constipation: Secondary | ICD-10-CM | POA: Diagnosis not present

## 2022-04-14 DIAGNOSIS — Z87891 Personal history of nicotine dependence: Secondary | ICD-10-CM | POA: Diagnosis not present

## 2022-04-21 DIAGNOSIS — R7303 Prediabetes: Secondary | ICD-10-CM | POA: Diagnosis not present

## 2022-04-21 DIAGNOSIS — Z86718 Personal history of other venous thrombosis and embolism: Secondary | ICD-10-CM | POA: Diagnosis not present

## 2022-04-21 DIAGNOSIS — Z87440 Personal history of urinary (tract) infections: Secondary | ICD-10-CM | POA: Diagnosis not present

## 2022-04-21 DIAGNOSIS — I129 Hypertensive chronic kidney disease with stage 1 through stage 4 chronic kidney disease, or unspecified chronic kidney disease: Secondary | ICD-10-CM | POA: Diagnosis not present

## 2022-04-21 DIAGNOSIS — E039 Hypothyroidism, unspecified: Secondary | ICD-10-CM | POA: Diagnosis not present

## 2022-04-21 DIAGNOSIS — Z86711 Personal history of pulmonary embolism: Secondary | ICD-10-CM | POA: Diagnosis not present

## 2022-04-21 DIAGNOSIS — I82221 Chronic embolism and thrombosis of inferior vena cava: Secondary | ICD-10-CM | POA: Diagnosis not present

## 2022-04-21 DIAGNOSIS — N1831 Chronic kidney disease, stage 3a: Secondary | ICD-10-CM | POA: Diagnosis not present

## 2022-04-21 DIAGNOSIS — Z993 Dependence on wheelchair: Secondary | ICD-10-CM | POA: Diagnosis not present

## 2022-04-21 DIAGNOSIS — E785 Hyperlipidemia, unspecified: Secondary | ICD-10-CM | POA: Diagnosis not present

## 2022-04-21 DIAGNOSIS — Z7902 Long term (current) use of antithrombotics/antiplatelets: Secondary | ICD-10-CM | POA: Diagnosis not present

## 2022-04-21 DIAGNOSIS — Z7982 Long term (current) use of aspirin: Secondary | ICD-10-CM | POA: Diagnosis not present

## 2022-04-21 DIAGNOSIS — K5909 Other constipation: Secondary | ICD-10-CM | POA: Diagnosis not present

## 2022-04-21 DIAGNOSIS — Z87891 Personal history of nicotine dependence: Secondary | ICD-10-CM | POA: Diagnosis not present

## 2022-04-21 DIAGNOSIS — I69354 Hemiplegia and hemiparesis following cerebral infarction affecting left non-dominant side: Secondary | ICD-10-CM | POA: Diagnosis not present

## 2022-04-22 DIAGNOSIS — D638 Anemia in other chronic diseases classified elsewhere: Secondary | ICD-10-CM | POA: Diagnosis not present

## 2022-04-22 DIAGNOSIS — I129 Hypertensive chronic kidney disease with stage 1 through stage 4 chronic kidney disease, or unspecified chronic kidney disease: Secondary | ICD-10-CM | POA: Diagnosis not present

## 2022-04-22 DIAGNOSIS — N1831 Chronic kidney disease, stage 3a: Secondary | ICD-10-CM | POA: Diagnosis not present

## 2022-04-22 DIAGNOSIS — I639 Cerebral infarction, unspecified: Secondary | ICD-10-CM | POA: Diagnosis not present

## 2022-04-22 DIAGNOSIS — R8290 Unspecified abnormal findings in urine: Secondary | ICD-10-CM | POA: Diagnosis not present

## 2022-04-22 DIAGNOSIS — R809 Proteinuria, unspecified: Secondary | ICD-10-CM | POA: Diagnosis not present

## 2022-04-23 DIAGNOSIS — Z993 Dependence on wheelchair: Secondary | ICD-10-CM | POA: Diagnosis not present

## 2022-04-23 DIAGNOSIS — R7303 Prediabetes: Secondary | ICD-10-CM | POA: Diagnosis not present

## 2022-04-23 DIAGNOSIS — Z7982 Long term (current) use of aspirin: Secondary | ICD-10-CM | POA: Diagnosis not present

## 2022-04-23 DIAGNOSIS — Z86718 Personal history of other venous thrombosis and embolism: Secondary | ICD-10-CM | POA: Diagnosis not present

## 2022-04-23 DIAGNOSIS — I129 Hypertensive chronic kidney disease with stage 1 through stage 4 chronic kidney disease, or unspecified chronic kidney disease: Secondary | ICD-10-CM | POA: Diagnosis not present

## 2022-04-23 DIAGNOSIS — E785 Hyperlipidemia, unspecified: Secondary | ICD-10-CM | POA: Diagnosis not present

## 2022-04-23 DIAGNOSIS — Z86711 Personal history of pulmonary embolism: Secondary | ICD-10-CM | POA: Diagnosis not present

## 2022-04-23 DIAGNOSIS — E039 Hypothyroidism, unspecified: Secondary | ICD-10-CM | POA: Diagnosis not present

## 2022-04-23 DIAGNOSIS — Z87440 Personal history of urinary (tract) infections: Secondary | ICD-10-CM | POA: Diagnosis not present

## 2022-04-23 DIAGNOSIS — Z7902 Long term (current) use of antithrombotics/antiplatelets: Secondary | ICD-10-CM | POA: Diagnosis not present

## 2022-04-23 DIAGNOSIS — I82221 Chronic embolism and thrombosis of inferior vena cava: Secondary | ICD-10-CM | POA: Diagnosis not present

## 2022-04-23 DIAGNOSIS — N1831 Chronic kidney disease, stage 3a: Secondary | ICD-10-CM | POA: Diagnosis not present

## 2022-04-23 DIAGNOSIS — I69354 Hemiplegia and hemiparesis following cerebral infarction affecting left non-dominant side: Secondary | ICD-10-CM | POA: Diagnosis not present

## 2022-04-23 DIAGNOSIS — Z87891 Personal history of nicotine dependence: Secondary | ICD-10-CM | POA: Diagnosis not present

## 2022-04-23 DIAGNOSIS — K5909 Other constipation: Secondary | ICD-10-CM | POA: Diagnosis not present

## 2022-04-24 ENCOUNTER — Encounter (HOSPITAL_COMMUNITY): Payer: Self-pay | Admitting: Emergency Medicine

## 2022-04-24 ENCOUNTER — Observation Stay (HOSPITAL_COMMUNITY)
Admission: EM | Admit: 2022-04-24 | Discharge: 2022-04-25 | Disposition: A | Discharge status: Home / Self Care | Payer: Medicare Other | Attending: Internal Medicine | Admitting: Internal Medicine

## 2022-04-24 ENCOUNTER — Emergency Department (HOSPITAL_COMMUNITY): Payer: Medicare Other

## 2022-04-24 ENCOUNTER — Other Ambulatory Visit: Payer: Self-pay

## 2022-04-24 DIAGNOSIS — N3000 Acute cystitis without hematuria: Secondary | ICD-10-CM

## 2022-04-24 DIAGNOSIS — W19XXXA Unspecified fall, initial encounter: Secondary | ICD-10-CM | POA: Diagnosis not present

## 2022-04-24 DIAGNOSIS — N400 Enlarged prostate without lower urinary tract symptoms: Secondary | ICD-10-CM | POA: Diagnosis present

## 2022-04-24 DIAGNOSIS — Z7982 Long term (current) use of aspirin: Secondary | ICD-10-CM | POA: Diagnosis not present

## 2022-04-24 DIAGNOSIS — I69354 Hemiplegia and hemiparesis following cerebral infarction affecting left non-dominant side: Secondary | ICD-10-CM | POA: Diagnosis not present

## 2022-04-24 DIAGNOSIS — R351 Nocturia: Secondary | ICD-10-CM

## 2022-04-24 DIAGNOSIS — N183 Chronic kidney disease, stage 3 unspecified: Secondary | ICD-10-CM | POA: Diagnosis not present

## 2022-04-24 DIAGNOSIS — I129 Hypertensive chronic kidney disease with stage 1 through stage 4 chronic kidney disease, or unspecified chronic kidney disease: Secondary | ICD-10-CM | POA: Insufficient documentation

## 2022-04-24 DIAGNOSIS — Z6833 Body mass index (BMI) 33.0-33.9, adult: Secondary | ICD-10-CM | POA: Diagnosis not present

## 2022-04-24 DIAGNOSIS — E039 Hypothyroidism, unspecified: Secondary | ICD-10-CM | POA: Diagnosis not present

## 2022-04-24 DIAGNOSIS — Z79899 Other long term (current) drug therapy: Secondary | ICD-10-CM | POA: Insufficient documentation

## 2022-04-24 DIAGNOSIS — Z20822 Contact with and (suspected) exposure to covid-19: Secondary | ICD-10-CM | POA: Insufficient documentation

## 2022-04-24 DIAGNOSIS — E782 Mixed hyperlipidemia: Secondary | ICD-10-CM | POA: Diagnosis present

## 2022-04-24 DIAGNOSIS — G8110 Spastic hemiplegia affecting unspecified side: Secondary | ICD-10-CM | POA: Diagnosis present

## 2022-04-24 DIAGNOSIS — N390 Urinary tract infection, site not specified: Secondary | ICD-10-CM | POA: Diagnosis not present

## 2022-04-24 DIAGNOSIS — E669 Obesity, unspecified: Secondary | ICD-10-CM | POA: Diagnosis present

## 2022-04-24 DIAGNOSIS — R509 Fever, unspecified: Secondary | ICD-10-CM | POA: Insufficient documentation

## 2022-04-24 DIAGNOSIS — Z87891 Personal history of nicotine dependence: Secondary | ICD-10-CM | POA: Insufficient documentation

## 2022-04-24 DIAGNOSIS — Z8673 Personal history of transient ischemic attack (TIA), and cerebral infarction without residual deficits: Secondary | ICD-10-CM | POA: Diagnosis not present

## 2022-04-24 DIAGNOSIS — I6930 Unspecified sequelae of cerebral infarction: Secondary | ICD-10-CM | POA: Diagnosis not present

## 2022-04-24 DIAGNOSIS — Z743 Need for continuous supervision: Secondary | ICD-10-CM | POA: Diagnosis not present

## 2022-04-24 DIAGNOSIS — N401 Enlarged prostate with lower urinary tract symptoms: Secondary | ICD-10-CM

## 2022-04-24 DIAGNOSIS — E66811 Obesity, class 1: Secondary | ICD-10-CM | POA: Diagnosis present

## 2022-04-24 DIAGNOSIS — I10 Essential (primary) hypertension: Secondary | ICD-10-CM | POA: Diagnosis present

## 2022-04-24 DIAGNOSIS — R531 Weakness: Secondary | ICD-10-CM | POA: Diagnosis not present

## 2022-04-24 DIAGNOSIS — R55 Syncope and collapse: Secondary | ICD-10-CM | POA: Diagnosis not present

## 2022-04-24 LAB — URINALYSIS, ROUTINE W REFLEX MICROSCOPIC
Bacteria, UA: NONE SEEN
Bilirubin Urine: NEGATIVE
Glucose, UA: NEGATIVE mg/dL
Ketones, ur: NEGATIVE mg/dL
Nitrite: NEGATIVE
Protein, ur: NEGATIVE mg/dL
Specific Gravity, Urine: 1.01 (ref 1.005–1.030)
WBC, UA: >50 WBC/hpf — ABNORMAL HIGH (ref 0–5)
pH: 5 (ref 5.0–8.0)

## 2022-04-24 LAB — CBC
HCT: 42.1 % (ref 39.0–52.0)
Hemoglobin: 14 g/dL (ref 13.0–17.0)
MCH: 27.5 pg (ref 26.0–34.0)
MCHC: 33.3 g/dL (ref 30.0–36.0)
MCV: 82.5 fL (ref 80.0–100.0)
Platelets: 178 10*3/uL (ref 150–400)
RBC: 5.1 MIL/uL (ref 4.22–5.81)
RDW: 14 % (ref 11.5–15.5)
WBC: 10.5 10*3/uL (ref 4.0–10.5)
nRBC: 0 % (ref 0.0–0.2)

## 2022-04-24 LAB — BASIC METABOLIC PANEL
Anion gap: 5 (ref 5–15)
BUN: 15 mg/dL (ref 6–20)
CO2: 29 mmol/L (ref 22–32)
Calcium: 9.4 mg/dL (ref 8.9–10.3)
Chloride: 106 mmol/L (ref 98–111)
Creatinine, Ser: 1.47 mg/dL — ABNORMAL HIGH (ref 0.61–1.24)
GFR, Estimated: 55 mL/min — ABNORMAL LOW (ref >60)
Glucose, Bld: 113 mg/dL — ABNORMAL HIGH (ref 70–99)
Potassium: 3.6 mmol/L (ref 3.5–5.1)
Sodium: 140 mmol/L (ref 135–145)

## 2022-04-24 LAB — CBG MONITORING, ED: Glucose-Capillary: 82 mg/dL (ref 70–99)

## 2022-04-24 LAB — SARS CORONAVIRUS 2 BY RT PCR: SARS Coronavirus 2 by RT PCR: NEGATIVE

## 2022-04-24 LAB — LACTIC ACID, PLASMA: Lactic Acid, Venous: 1.4 mmol/L (ref 0.5–1.9)

## 2022-04-24 IMAGING — DX DG CHEST 2V
2 series · 2 of 2 positions shown · non-contrast
Comparison: None Available.

CLINICAL DATA: Syncope x today. Left sided stroke x [FO]. Could not
raise left arm, best obtainable images. syncope

EXAM:
CHEST - 2 VIEW

[chest ap]
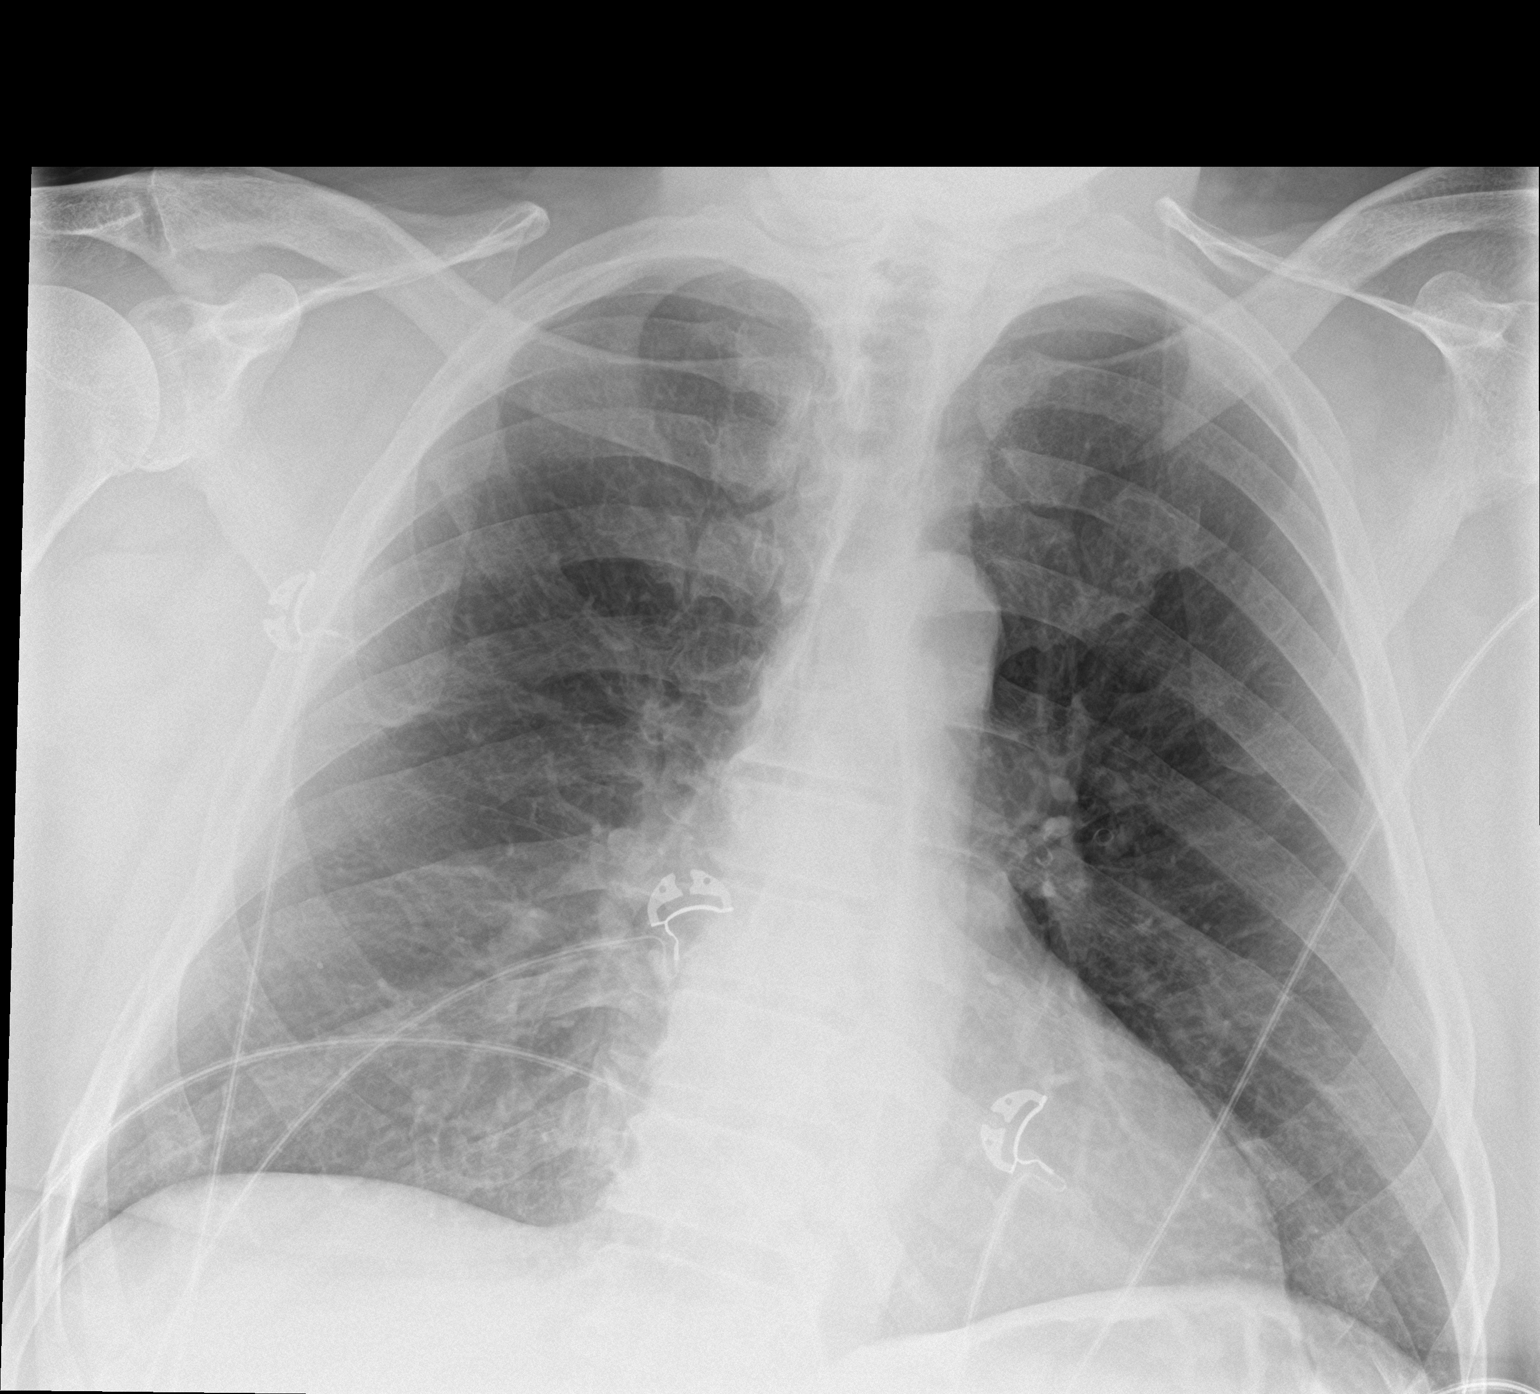

[chest lat]
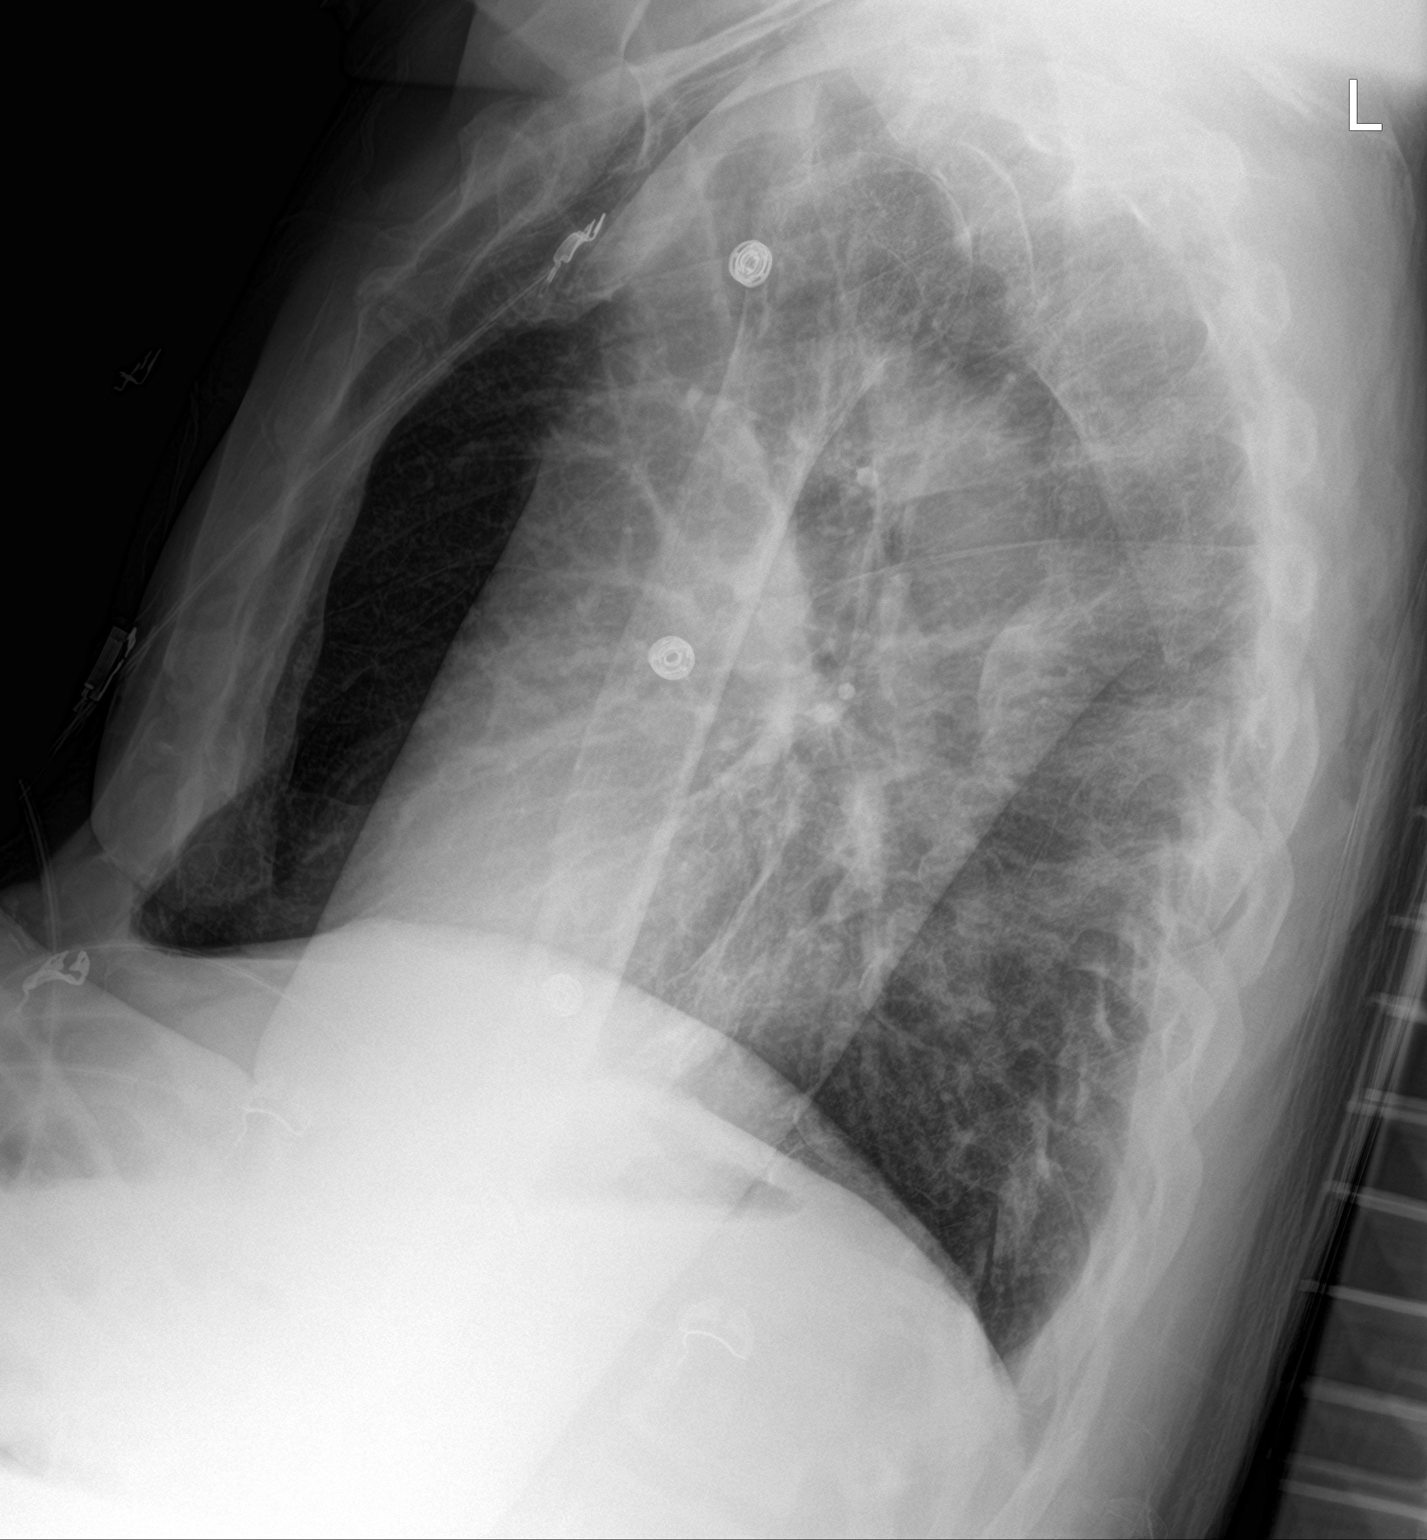

[2 of 2 positions shown; findings below may reference images not displayed]

FINDINGS: Normal mediastinum and cardiac silhouette. Normal pulmonary
vasculature. No evidence of effusion, infiltrate, or pneumothorax.
No acute bony abnormality.
IMPRESSION: No acute cardiopulmonary process.

## 2022-04-24 MED ORDER — SODIUM CHLORIDE 0.9 % IV SOLN
1.0000 g | Freq: Once | INTRAVENOUS | Status: AC
Start: 2022-04-24 — End: 2022-04-24
  Administered 2022-04-24: 1 g via INTRAVENOUS
  Filled 2022-04-24: qty 10

## 2022-04-24 MED ORDER — LEVOTHYROXINE SODIUM 25 MCG PO TABS
25.0000 ug | ORAL_TABLET | Freq: Every day | ORAL | Status: DC
  Administered 2022-04-25: 25 ug via ORAL
  Filled 2022-04-24: qty 1

## 2022-04-24 MED ORDER — LEVETIRACETAM 500 MG PO TABS
500.0000 mg | ORAL_TABLET | Freq: Two times a day (BID) | ORAL | Status: DC
  Administered 2022-04-24 – 2022-04-25 (×2): 500 mg via ORAL
  Filled 2022-04-24 (×2): qty 1

## 2022-04-24 MED ORDER — SODIUM CHLORIDE 0.9 % IV SOLN
1.0000 g | INTRAVENOUS | Status: DC
  Administered 2022-04-25: 1 g via INTRAVENOUS
  Filled 2022-04-24: qty 10

## 2022-04-24 MED ORDER — SENNOSIDES-DOCUSATE SODIUM 8.6-50 MG PO TABS
1.0000 | ORAL_TABLET | Freq: Every day | ORAL | Status: DC
  Administered 2022-04-24: 1 via ORAL
  Filled 2022-04-24: qty 1

## 2022-04-24 MED ORDER — DABIGATRAN ETEXILATE MESYLATE 150 MG PO CAPS
150.0000 mg | ORAL_CAPSULE | Freq: Two times a day (BID) | ORAL | Status: DC
  Administered 2022-04-24 – 2022-04-25 (×2): 150 mg via ORAL
  Filled 2022-04-24 (×2): qty 1

## 2022-04-24 MED ORDER — SODIUM CHLORIDE 0.9 % IV BOLUS
500.0000 mL | Freq: Once | INTRAVENOUS | Status: AC
  Administered 2022-04-24: 500 mL via INTRAVENOUS

## 2022-04-24 MED ORDER — ASPIRIN 81 MG PO TBEC
81.0000 mg | DELAYED_RELEASE_TABLET | Freq: Every day | ORAL | Status: DC
  Administered 2022-04-25: 81 mg via ORAL
  Filled 2022-04-24: qty 1

## 2022-04-24 MED ORDER — ACETAMINOPHEN 325 MG PO TABS
650.0000 mg | ORAL_TABLET | Freq: Once | ORAL | Status: AC
  Administered 2022-04-24: 650 mg via ORAL
  Filled 2022-04-24: qty 2

## 2022-04-24 MED ORDER — ATORVASTATIN CALCIUM 40 MG PO TABS
40.0000 mg | ORAL_TABLET | Freq: Every day | ORAL | Status: DC
  Administered 2022-04-24 – 2022-04-25 (×2): 40 mg via ORAL
  Filled 2022-04-24 (×2): qty 1

## 2022-04-24 MED ORDER — ACETAMINOPHEN 325 MG PO TABS: 650.0000 mg | ORAL_TABLET | Freq: Four times a day (QID) | ORAL | Status: DC | PRN

## 2022-04-24 MED ORDER — AMLODIPINE BESYLATE 5 MG PO TABS
5.0000 mg | ORAL_TABLET | Freq: Every day | ORAL | Status: DC
Start: 2022-04-25 — End: 2022-04-25
  Administered 2022-04-25: 5 mg via ORAL
  Filled 2022-04-24: qty 1

## 2022-04-24 MED ORDER — LISINOPRIL 5 MG PO TABS
2.5000 mg | ORAL_TABLET | Freq: Every day | ORAL | Status: DC
  Administered 2022-04-25: 2.5 mg via ORAL
  Filled 2022-04-24: qty 1

## 2022-04-24 MED ORDER — SODIUM CHLORIDE 0.9 % IV SOLN
1.0000 g | Freq: Once | INTRAVENOUS | Status: AC
  Administered 2022-04-24: 1 g via INTRAVENOUS
  Filled 2022-04-24: qty 10

## 2022-04-24 MED ORDER — PANTOPRAZOLE SODIUM 40 MG PO TBEC
40.0000 mg | DELAYED_RELEASE_TABLET | Freq: Every day | ORAL | Status: DC
  Administered 2022-04-24: 40 mg via ORAL
  Filled 2022-04-24: qty 1

## 2022-04-24 NOTE — ED Provider Notes (Signed)
[facility] EMERGENCY DEPARTMENT Provider Note   CSN: [id number] Arrival date & time: 04/24/22  1248     History {Add pertinent medical, surgical, social history, OB history to HPI:1} Chief Complaint  Patient presents with   Near Syncope    Matthew Wise is a 61 y.o. male.  He has a history of CVA leaving him with left-sided deficits.  He is here with a complaint of generalized weakness.  He said he was standing in the bathroom urinating when his legs were shaky and he needed to sit down on the floor.  He usually uses a cane to ambulate.  He said his arms were also trembling.  He said this is similar to back a month or 2 ago when he was diagnosed with a UTI.  No headache blurry vision double vision chest pain shortness of breath abdominal pain or dysuria.  He does endorse urinary frequency.  No hematuria.  He feels he has been eating and drinking well.  The history is provided by the patient.  Near Syncope The current episode started 1 to 2 hours ago. The problem has not changed since onset.Pertinent negatives include no chest pain, no abdominal pain, no headaches and no shortness of breath. The symptoms are aggravated by standing. The symptoms are relieved by position. He has tried rest for the symptoms. The treatment provided mild relief.      Home Medications Prior to Admission medications   Medication Sig Start Date End Date Taking? Authorizing Provider  pantoprazole (PROTONIX) 40 MG tablet Take by mouth. 08/01/21  Yes [provider]  acetaminophen (TYLENOL) 325 MG tablet Take 2 tablets (650 mg total) by mouth every 4 (four) hours as needed for mild pain (or temp > 37.5 C (99.5 F)). 03/11/21   Angiulli, Lavon Paganini, PA-C  amLODipine (NORVASC) 5 MG tablet Take 1 tablet (5 mg total) by mouth daily. 12/11/21   Lindell Spar, MD  aspirin EC 81 MG EC tablet Take 1 tablet (81 mg total) by mouth daily. Swallow whole. 02/13/21   Bailey-Modzik, Delila A, NP  atorvastatin (LIPITOR) 40  MG tablet Take 1 tablet by mouth once daily 03/09/22   Lindell Spar, MD  cetirizine (ZYRTEC) 10 MG tablet Take 1 tablet (10 mg total) by mouth daily. 03/25/22   Lindell Spar, MD  levETIRAcetam (KEPPRA) 500 MG tablet Take 1 tablet (500 mg total) by mouth 2 (two) times daily. 12/17/21   Frann Rider, NP  levothyroxine (SYNTHROID) 25 MCG tablet Take 1 tablet (25 mcg total) by mouth daily. 03/09/22   Lindell Spar, MD  lisinopril (ZESTRIL) 2.5 MG tablet Take 1 tablet (2.5 mg total) by mouth daily. 03/26/22   Lindell Spar, MD  pantoprazole (PROTONIX) 40 MG tablet TAKE 1 TABLET BY MOUTH EVERYDAY AT BEDTIME 08/01/21   Ailene Ards, NP  PRADAXA 150 MG CAPS capsule TAKE 1 CAPSULE BY MOUTH EVERY 12 HOURS 12/31/21   Lindell Spar, MD  senna-docusate (SENOKOT-S) 8.6-50 MG tablet Take 1 tablet by mouth at bedtime. 02/12/21   Bailey-Modzik, Delila A, NP  tamsulosin (FLOMAX) 0.4 MG CAPS capsule TAKE 1 CAPSULE BY MOUTH DAILY AFTER SUPPER 03/09/22   Lindell Spar, MD      Allergies    Patient has no known allergies.    Review of Systems   Review of Systems  Constitutional:  Negative for fever.  HENT:  Negative for sore throat.   Eyes:  Negative for visual disturbance.  Respiratory:  Negative for shortness of breath.   Cardiovascular:  Positive for near-syncope. Negative for chest pain.  Gastrointestinal:  Negative for abdominal pain.  Genitourinary:  Positive for frequency. Negative for dysuria.  Musculoskeletal:  Positive for gait problem.  Skin:  Negative for rash.  Neurological:  Positive for weakness. Negative for headaches.   Physical Exam Updated Vital Signs BP 134/82 (BP Location: Right Arm)   Pulse 90   Temp 100.1 F (37.8 C) (Oral)   Resp 16   Ht 5' 11.5" (1.816 m)   Wt 108.9 kg   SpO2 99%   BMI 33.01 kg/m  Physical Exam Vitals and nursing note reviewed.  Constitutional:      General: He is not in acute distress.    Appearance: Normal appearance. He is well-developed.   HENT:     Head: Normocephalic and atraumatic.  Eyes:     Conjunctiva/sclera: Conjunctivae normal.  Cardiovascular:     Rate and Rhythm: Normal rate and regular rhythm.     Heart sounds: No murmur heard. Pulmonary:     Effort: Pulmonary effort is normal. No respiratory distress.     Breath sounds: Normal breath sounds.  Abdominal:     Palpations: Abdomen is soft.     Tenderness: There is no abdominal tenderness. There is no guarding or rebound.  Musculoskeletal:        General: No deformity.     Cervical back: Neck supple.     Right lower leg: No edema.     Left lower leg: No edema.  Skin:    General: Skin is warm and dry.     Capillary Refill: Capillary refill takes less than 2 seconds.  Neurological:     Mental Status: He is alert. Mental status is at baseline.     Motor: Weakness present.     Comments: Patient has limited use of left arm and moderate use of left leg.  Otherwise intact    ED Results / Procedures / Treatments   Labs (all labs ordered are listed, but only abnormal results are displayed) Labs Reviewed  BASIC METABOLIC PANEL - Abnormal; Notable for the following components:      Result Value   Glucose, Bld 113 (*)    Creatinine, Ser 1.47 (*)    GFR, Estimated 55 (*)    All other components within normal limits  URINALYSIS, ROUTINE W REFLEX MICROSCOPIC - Abnormal; Notable for the following components:   APPearance TURBID (*)    Hgb urine dipstick SMALL (*)    Leukocytes,Ua LARGE (*)    WBC, UA >50 (*)    All other components within normal limits  URINE CULTURE  CBC  CBG MONITORING, ED    EKG EKG Interpretation  Date/Time:  Friday Apr 24 2022 13:25:52 EDT Ventricular Rate:  79 PR Interval:    QRS Duration: 94 QT Interval:  398 QTC Calculation: 457 R Axis:   38 Text Interpretation: Normal sinus rhythm Inferior infarct, old No significant change since prior 3/23 Confirmed by Aletta Edouard [phone]) on 04/24/2022 3:22:50 PM  Radiology DG Chest 2  View  Result Date: 04/24/2022 CLINICAL DATA:  Syncope x today. Left sided stroke x 2022. Could not raise left arm, best obtainable images. syncope EXAM: CHEST - 2 VIEW COMPARISON:  None Available. FINDINGS: Normal mediastinum and cardiac silhouette. Normal pulmonary vasculature. No evidence of effusion, infiltrate, or pneumothorax. No acute bony abnormality. IMPRESSION: No acute cardiopulmonary process. Electronically Signed   By: Suzy Bouchard M.D.   On:  04/24/2022 15:07    Procedures Procedures  {Document cardiac monitor, telemetry assessment procedure when appropriate:1}  Medications Ordered in ED Medications  sodium chloride 0.9 % bolus 500 mL (has no administration in time range)    ED Course/ Medical Decision Making/ A&P                           Medical Decision Making Amount and/or Complexity of Data Reviewed Labs: ordered.  This patient complains of ***; this involves an extensive number of treatment Options and is a complaint that carries with it a high risk of complications and morbidity. The differential includes ***  I ordered, reviewed and interpreted labs, which included *** I ordered medication *** and reviewed PMP when indicated. I ordered imaging studies which included *** and I independently    visualized and interpreted imaging which showed *** Additional history obtained from *** Previous records obtained and reviewed *** I consulted *** and discussed lab and imaging findings and discussed disposition.  Cardiac monitoring reviewed, *** Social determinants considered, *** Critical Interventions: ***  After the interventions stated above, I reevaluated the patient and found *** Admission and further testing considered, ***    {Document critical care time when appropriate:1} {Document review of labs and clinical decision tools ie heart score, Chads2Vasc2 etc:1}  {Document your independent review of radiology images, and any outside records:1} {Document  your discussion with family members, caretakers, and with consultants:1} {Document social determinants of health affecting pt's care:1} {Document your decision making why or why not admission, treatments were needed:1} Final Clinical Impression(s) / ED Diagnoses Final diagnoses:  None    Rx / DC Orders ED Discharge Orders     None

## 2022-04-24 NOTE — H&P (Signed)
TRH H&P   Patient Demographics:    Matthew Wise, is a 61 y.o. male  MRN: [id number]   DOB - 12-04-1960  Admit Date - 04/24/2022  Outpatient Primary MD for the patient is Lindell Spar, MD  Referring MD/NP/PA: Dr Melina Copa  Patient coming from: home  Chief Complaint  Patient presents with   Near Syncope      HPI:    Matthew Wise  is a 61 y.o. male, with past medical history of CVA, with residual left-sided weakness, PE, hypothyroidism, hypertension, hyperlipidemia, patient presents to ED secondary to complaints of generalized weakness and fall, patient reports she usually ambulates with a cane, sometimes walker, having difficulty with ambulation at baseline, usually he ambulates in a urinal, but reports he wears pull-up at nighttime or is difficult for him to go to the commode, report feeling weak today, and upon standing, he felt his legs giving up on him where he fell on the floor, he denies any head trauma, dizziness, lightheadedness or loss of consciousness, reports that this weakness resembles previous UTIs, he does report increased frequency and urgency. -ED patient was noted to be febrile of 103.1, white blood cell count of 10, his UA was strongly positive, he was started on Rocephin, and [facility] hospitalist consulted to admit.    Review of systems:      A full 10 point Review of Systems was done, except as stated above, all other Review of Systems were negative.   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 two simple tubular adenomas removed. patient was advised to go to [facility] for colonoscopy but he did not keep appt.   IR CT HEAD LTD  02/02/2021   IR PERCUTANEOUS ART THROMBECTOMY/INFUSION INTRACRANIAL INC DIAG ANGIO  02/02/2021   RADIOLOGY WITH ANESTHESIA N/A 02/02/2021  Procedure: IR WITH ANESTHESIA;  Surgeon: Luanne Bras, MD;  Location: [facility];  Service: Radiology;  Laterality: N/A;   TEE WITHOUT CARDIOVERSION N/A 09/10/2020   Procedure: TRANSESOPHAGEAL ECHOCARDIOGRAM (TEE) WITH PROPOFOL;  Surgeon: Satira Sark, MD;  Location: [facility];  Service: Cardiovascular;  Laterality: N/A;      Social History:     Social History   Tobacco Use   Smoking status: Former    Packs/day: 1.00    Years: 30.00    Pack years: 30.00    Types: Cigarettes    Start date: 12/01/1983   Smokeless tobacco: Never  Substance Use Topics   Alcohol use: Not Currently    Alcohol/week: 3.0 standard drinks    Types: 3 Cans of beer per week    Comment: has not drank sice 11/27/20         Family History  Problem Relation Age of Onset   COPD Father    Stroke Brother    Pulmonary embolism Sister    Throat cancer Brother    Arthritis Mother    Diabetes Mother    Stroke Mother    Hypertension Brother    Diabetes  Brother    Arthritis Brother    Diabetes Sister    Colon cancer Neg Hx       Home Medications:   Prior to Admission medications   Medication Sig Start Date End Date Taking? Authorizing Provider  acetaminophen (TYLENOL) 325 MG tablet Take 2 tablets (650 mg total) by mouth every 4 (four) hours as needed for mild pain (or temp > 37.5 C (99.5 F)). 03/11/21  Yes Angiulli, Lavon Paganini, PA-C  amLODipine (NORVASC) 5 MG tablet Take 1 tablet (5 mg total) by mouth daily. 12/11/21  Yes Lindell Spar, MD  aspirin EC 81 MG EC tablet Take 1 tablet (81 mg total) by mouth daily. Swallow whole. 02/13/21  Yes Bailey-Modzik, Delila A, NP  atorvastatin (LIPITOR) 40 MG tablet Take 1 tablet by mouth once daily 03/09/22  Yes Patel, Colin Broach, MD  cetirizine (ZYRTEC) 10 MG tablet Take 1 tablet (10 mg total) by mouth daily. 03/25/22  Yes Lindell Spar, MD  levETIRAcetam (KEPPRA) 500 MG tablet Take 1 tablet (500 mg total) by mouth 2 (two) times daily. 12/17/21  Yes McCue, Janett Billow, NP  levothyroxine (SYNTHROID) 25 MCG tablet Take 1 tablet (25 mcg total) by mouth daily. 03/09/22  Yes Lindell Spar, MD  lisinopril (ZESTRIL) 2.5 MG tablet Take 1 tablet (2.5 mg total) by mouth daily. 03/26/22  Yes Lindell Spar, MD  pantoprazole (PROTONIX) 40 MG tablet TAKE 1 TABLET BY MOUTH EVERYDAY AT BEDTIME Patient taking differently: Take 40 mg by mouth at bedtime. 08/01/21  Yes Ailene Ards, NP  PRADAXA 150 MG CAPS capsule TAKE 1 CAPSULE BY MOUTH EVERY 12 HOURS 12/31/21  Yes Lindell Spar, MD  senna-docusate (SENOKOT-S) 8.6-50 MG tablet Take 1 tablet by mouth at bedtime. 02/12/21  Yes Bailey-Modzik, Delila A, NP  tamsulosin (FLOMAX) 0.4 MG CAPS capsule TAKE 1 CAPSULE BY MOUTH DAILY AFTER SUPPER Patient taking differently: Take 0.4 mg by mouth daily after supper. 03/09/22  Yes Lindell Spar, MD     Allergies:    No Known Allergies   Physical Exam:   Vitals  Blood pressure 135/76, pulse (!) 43, temperature 99.9 F (37.7 C),  temperature source Oral, resp. rate 18, height 5' 11.5" (1.816 m), weight 108.9 kg, SpO2 91 %.   1. General well developed lying in  bed in NAD,   2. Normal affect and insight, Not Suicidal or Homicidal, Awake Alert, Oriented X 3.  3. No F.N deficits, ALL C.Nerves Intact, chronic left-sided weakness, Sensation intact all 4 extremities, Plantars down going.  4. Ears and Eyes appear Normal, Conjunctivae clear, PERRLA. Moist Oral Mucosa.  5. Supple Neck, No JVD, No cervical lymphadenopathy appriciated, No Carotid Bruits.  6. Symmetrical Chest wall movement, Good air movement bilaterally, CTAB.  7. RRR, No Gallops, Rubs or Murmurs, No Parasternal Heave.  8. Positive Bowel Sounds, Abdomen Soft, No tenderness, No organomegaly appriciated,No rebound -guarding or rigidity.  9.  No Cyanosis, Normal Skin Turgor, No Skin Rash or Bruise.  10. Good muscle tone,  joints appear normal , no effusions, Normal ROM.  11. No Palpable Lymph Nodes in Neck or Axillae     Data Review:    CBC Recent Labs  Lab 04/24/22 1336  WBC 10.5  HGB 14.0  HCT 42.1  PLT 178  MCV 82.5  MCH 27.5  MCHC 33.3  RDW 14.0   ------------------------------------------------------------------------------------------------------------------  Chemistries  Recent Labs  Lab 04/24/22 1336  NA 140  K 3.6  CL 106  CO2 29  GLUCOSE 113*  BUN 15  CREATININE 1.47*  CALCIUM 9.4   ------------------------------------------------------------------------------------------------------------------ estimated creatinine clearance is 68.5 mL/min (A) (by C-G formula based on SCr of 1.47 mg/dL (H)). ------------------------------------------------------------------------------------------------------------------ No results for input(s): TSH, T4TOTAL, T3FREE, THYROIDAB in the last 72 hours.  Invalid input(s): FREET3  Coagulation profile No results for input(s): INR, PROTIME in the last 168  hours. ------------------------------------------------------------------------------------------------------------------- No results for input(s): DDIMER in the last 72 hours. -------------------------------------------------------------------------------------------------------------------  Cardiac Enzymes No results for input(s): CKMB, TROPONINI, MYOGLOBIN in the last 168 hours.  Invalid input(s): CK ------------------------------------------------------------------------------------------------------------------ No results found for: BNP   ---------------------------------------------------------------------------------------------------------------  Urinalysis    Component Value Date/Time   COLORURINE YELLOW 04/24/2022 1309   APPEARANCEUR TURBID (A) 04/24/2022 1309   LABSPEC 1.010 04/24/2022 1309   PHURINE 5.0 04/24/2022 1309   GLUCOSEU NEGATIVE 04/24/2022 1309   HGBUR SMALL (A) 04/24/2022 1309   BILIRUBINUR NEGATIVE 04/24/2022 1309   KETONESUR NEGATIVE 04/24/2022 1309   PROTEINUR NEGATIVE 04/24/2022 1309   UROBILINOGEN >8.0 (H) 02/03/2015 1854   NITRITE NEGATIVE 04/24/2022 1309   LEUKOCYTESUR LARGE (A) 04/24/2022 1309    ----------------------------------------------------------------------------------------------------------------   Imaging Results:    DG Chest 2 View  Result Date: 04/24/2022 CLINICAL DATA:  Syncope x today. Left sided stroke x 2022. Could not raise left arm, best obtainable images. syncope EXAM: CHEST - 2 VIEW COMPARISON:  None Available. FINDINGS: Normal mediastinum and cardiac silhouette. Normal pulmonary vasculature. No evidence of effusion, infiltrate, or pneumothorax. No acute bony abnormality. IMPRESSION: No acute cardiopulmonary process. Electronically Signed   By: Suzy Bouchard M.D.   On: 04/24/2022 15:07    My personal review of EKG: Rhythm NSR, Rate  79 /min, QTc 457 ( it reads as A-fib, which appears to be inaccurate, appears to be normal  sinus rhythm)   Assessment & Plan:    Principal Problem:   UTI (urinary tract infection) Active Problems:   Chronic kidney disease, stage 3 unspecified (HCC)   Essential hypertension   Mixed hyperlipidemia   Hypothyroidism   Hemiparesis affecting left side as late effect of stroke (HCC)   Spastic hemiplegia affecting nondominant side (HCC)   BPH (benign prostatic hyperplasia)   History of CVA with residual deficit  UTI -Patient presents with urinary symptoms, including increased urgency, and polyuria -His UA strongly positive,  will start on IV Rocephin -Follow on urine cultures  Generalized weakness, deconditioning -With baseline left-sided weakness from previous CVA, but significant deconditioning and weakness today due to infectious process -We will consult PT/OT  Hypothyroidism -Continue with Synthroid  Hypertension -Continue with home medications  History of CVA -Continue with aspirin and statin  History of PE -Continue with Pradaxa   History of seizures -continue with Keppra  Hyperlipidemia -Continue with statin  Obesity -With BMI of 33  BPH Continue with Flomax    DVT Prophylaxis Pradaxa  AM Labs Ordered, also please review Full Orders  Family Communication: Admission, patients condition and plan of care including tests being ordered have been discussed with the patient and Sister who indicate understanding and agree with the plan and Code Status.  Code Status Full  Likely DC to  home  Condition GUARDED    Consults called: none    Admission status: inpatient    Time spent in minutes : 70 minutes   Phillips Climes M.D on 04/24/2022 at [place] PM   [facility] - Office  [phone]

## 2022-04-24 NOTE — ED Notes (Signed)
Pt linens changed, new brief put on, male purewick placed.

## 2022-04-24 NOTE — ED Notes (Signed)
Patient transported to X-ray 

## 2022-04-24 NOTE — ED Triage Notes (Signed)
Pt to the ED with [facility] from home after a near syncopal event. The pt was in the bathroom when he states his legs gave out and he slid himself to the floor without injury.  Denies pain or injury but does have a history of CVA with left side residual deficits. Family states the patient is having increased difficulty with ambulation.  The pt had a UTI a month ago and feels like he is having some of the same symptoms he had prior to treatment.

## 2022-04-25 DIAGNOSIS — N390 Urinary tract infection, site not specified: Secondary | ICD-10-CM | POA: Diagnosis not present

## 2022-04-25 DIAGNOSIS — E782 Mixed hyperlipidemia: Secondary | ICD-10-CM

## 2022-04-25 DIAGNOSIS — G8110 Spastic hemiplegia affecting unspecified side: Secondary | ICD-10-CM | POA: Diagnosis not present

## 2022-04-25 DIAGNOSIS — E669 Obesity, unspecified: Secondary | ICD-10-CM | POA: Diagnosis present

## 2022-04-25 DIAGNOSIS — E039 Hypothyroidism, unspecified: Secondary | ICD-10-CM

## 2022-04-25 DIAGNOSIS — N1831 Chronic kidney disease, stage 3a: Secondary | ICD-10-CM

## 2022-04-25 DIAGNOSIS — I6930 Unspecified sequelae of cerebral infarction: Secondary | ICD-10-CM | POA: Diagnosis not present

## 2022-04-25 DIAGNOSIS — R531 Weakness: Secondary | ICD-10-CM

## 2022-04-25 DIAGNOSIS — R55 Syncope and collapse: Principal | ICD-10-CM

## 2022-04-25 DIAGNOSIS — N401 Enlarged prostate with lower urinary tract symptoms: Secondary | ICD-10-CM | POA: Diagnosis not present

## 2022-04-25 DIAGNOSIS — I10 Essential (primary) hypertension: Secondary | ICD-10-CM | POA: Diagnosis not present

## 2022-04-25 LAB — BASIC METABOLIC PANEL WITH GFR
Anion gap: 7 (ref 5–15)
BUN: 13 mg/dL (ref 6–20)
CO2: 26 mmol/L (ref 22–32)
Calcium: 9.1 mg/dL (ref 8.9–10.3)
Chloride: 107 mmol/L (ref 98–111)
Creatinine, Ser: 1.38 mg/dL — ABNORMAL HIGH (ref 0.61–1.24)
GFR, Estimated: 59 mL/min — ABNORMAL LOW
Glucose, Bld: 98 mg/dL (ref 70–99)
Potassium: 3.4 mmol/L — ABNORMAL LOW (ref 3.5–5.1)
Sodium: 140 mmol/L (ref 135–145)

## 2022-04-25 LAB — HIV ANTIBODY (ROUTINE TESTING W REFLEX): HIV Screen 4th Generation wRfx: NONREACTIVE

## 2022-04-25 LAB — CBC
HCT: 39.5 % (ref 39.0–52.0)
Hemoglobin: 13.2 g/dL (ref 13.0–17.0)
MCH: 27.4 pg (ref 26.0–34.0)
MCHC: 33.4 g/dL (ref 30.0–36.0)
MCV: 82.1 fL (ref 80.0–100.0)
Platelets: 173 K/uL (ref 150–400)
RBC: 4.81 MIL/uL (ref 4.22–5.81)
RDW: 14 % (ref 11.5–15.5)
WBC: 9.5 K/uL (ref 4.0–10.5)
nRBC: 0 % (ref 0.0–0.2)

## 2022-04-25 MED ORDER — CEFADROXIL 500 MG PO CAPS: 500.0000 mg | ORAL_CAPSULE | Freq: Two times a day (BID) | ORAL | 0 refills | Status: AC

## 2022-04-25 MED ORDER — POTASSIUM CHLORIDE CRYS ER 20 MEQ PO TBCR
40.0000 meq | EXTENDED_RELEASE_TABLET | Freq: Once | ORAL | Status: AC
Start: 2022-04-25 — End: 2022-04-25
  Administered 2022-04-25: 40 meq via ORAL
  Filled 2022-04-25: qty 2

## 2022-04-25 NOTE — Assessment & Plan Note (Signed)
-   Overall stable and well-controlled -Advised to follow heart healthy diet -Resume home antihypertensive regimen.

## 2022-04-25 NOTE — Assessment & Plan Note (Signed)
-  Continue risk factors modification. -Continue the use of aspirin and Pradaxa for secondary prevention.

## 2022-04-25 NOTE — Assessment & Plan Note (Signed)
-   Low calorie diet, portion control and increase physical activity discussed with patient -Body mass index is 33.01 kg/m.

## 2022-04-25 NOTE — Assessment & Plan Note (Signed)
-   Continue the use of Synthroid. 

## 2022-04-25 NOTE — Care Management CC44 (Signed)
Condition Code 44 Documentation Completed  Patient Details  Name: Matthew Wise MRN: [id number] Date of Birth: 12/15/1960   Condition Code 44 given:  Yes Patient signature on Condition Code 44 notice:  Yes Documentation of 2 MD's agreement:  Yes Code 44 added to claim:  Yes  Given by R Tarpley CSW  Verdell Carmine, RN 04/25/2022, 4:52 PM

## 2022-04-25 NOTE — Assessment & Plan Note (Signed)
-   Chronic kidney disease a stage IIIa at baseline -Creatinine is slightly higher than his normal range probably in the setting of UTI and prerenal azotemia. -After fluid resuscitation renal function back to baseline and is stable. -Advised to maintain adequate hydration and to take medications as prescribed. -Recommending a follow-up visit to repeat basic metabolic panel to follow electrolytes and renal function.

## 2022-04-25 NOTE — Assessment & Plan Note (Signed)
-   Advised to keep himself well-hydrated and complete antibiotics as instructed -No hematuria reported.

## 2022-04-25 NOTE — Progress Notes (Signed)
Nsg Discharge Note  Admit Date:  04/24/2022 Discharge date: 04/25/2022   Matthew Wise to be D/C'd Home per MD order.  AVS completed.   Patient/caregiver able to verbalize understanding.  Discharge Medication: Allergies as of 04/25/2022   No Known Allergies      Medication List     TAKE these medications    acetaminophen 325 MG tablet Commonly known as: TYLENOL Take 2 tablets (650 mg total) by mouth every 4 (four) hours as needed for mild pain (or temp > 37.5 C (99.5 F)).   amLODipine 5 MG tablet Commonly known as: NORVASC Take 1 tablet (5 mg total) by mouth daily.   aspirin EC 81 MG tablet Take 1 tablet (81 mg total) by mouth daily. Swallow whole.   atorvastatin 40 MG tablet Commonly known as: LIPITOR Take 1 tablet by mouth once daily   cefadroxil 500 MG capsule Commonly known as: DURICEF Take 1 capsule (500 mg total) by mouth 2 (two) times daily for 16 doses.   cetirizine 10 MG tablet Commonly known as: ZYRTEC Take 1 tablet (10 mg total) by mouth daily.   levETIRAcetam 500 MG tablet Commonly known as: KEPPRA Take 1 tablet (500 mg total) by mouth 2 (two) times daily.   levothyroxine 25 MCG tablet Commonly known as: SYNTHROID Take 1 tablet (25 mcg total) by mouth daily.   lisinopril 2.5 MG tablet Commonly known as: ZESTRIL Take 1 tablet (2.5 mg total) by mouth daily.   pantoprazole 40 MG tablet Commonly known as: PROTONIX TAKE 1 TABLET BY MOUTH EVERYDAY AT BEDTIME What changed: See the new instructions.   Pradaxa 150 MG Caps capsule Generic drug: dabigatran TAKE 1 CAPSULE BY MOUTH EVERY 12 HOURS   senna-docusate 8.6-50 MG tablet Commonly known as: Senokot-S Take 1 tablet by mouth at bedtime.   tamsulosin 0.4 MG Caps capsule Commonly known as: FLOMAX TAKE 1 CAPSULE BY MOUTH DAILY AFTER SUPPER What changed: See the new instructions.        Discharge Assessment: Vitals:   04/25/22 0435 04/25/22 1326  BP: 129/82 (!) 100/58  Pulse: 66 (!) 58   Resp: 18 20  Temp: 99.7 F (37.6 C) 99.2 F (37.3 C)  SpO2: 100% 100%   Skin clean, dry and intact without evidence of skin break down, no evidence of skin tears noted. IV catheter discontinued intact. Site without signs and symptoms of complications - no redness or edema noted at insertion site, patient denies c/o pain - only slight tenderness at site.  Dressing with slight pressure applied.  D/c Instructions-Education: Discharge instructions given to patient/family with verbalized understanding. D/c education completed with patient/family including follow up instructions, medication list, d/c activities limitations if indicated, with other d/c instructions as indicated by MD - patient able to verbalize understanding, all questions fully answered. Patient instructed to return to ED, call 911, or call MD for any changes in condition.  Patient escorted via [place], and D/C home via private auto.  Kathie Rhodes, RN 04/25/2022 2:09 PM

## 2022-04-25 NOTE — Assessment & Plan Note (Signed)
-   In the setting of UTI in a patient with underlying residual weakness from CVA. -Continue home health PT for rehabilitation and conditioning -Advised to maintain adequate hydration and complete treatment with antibiotic for UTI.

## 2022-04-25 NOTE — Discharge Summary (Signed)
Physician Discharge Summary   Patient: Matthew Wise MRN: [id number] DOB: [birth year]  Admit date:     04/24/2022  Discharge date: 04/25/22  Discharge Physician: Barton Dubois   PCP: Lindell Spar, MD   Recommendations at discharge:  Repeat basic metabolic panel to follow ultralights renal function Reassess blood pressure and adjust antihypertensive treatment as needed Follow complete resolution of patient's symptoms after completing antibiotic therapy.  Discharge Diagnoses: Principal Problem:   Lower urinary tract infectious disease Active Problems:   Chronic kidney disease, stage 3 unspecified (HCC)   Essential hypertension   Mixed hyperlipidemia   Hypothyroidism   Hemiparesis affecting left side as late effect of stroke (HCC)   Spastic hemiplegia affecting nondominant side (HCC)   BPH (benign prostatic hyperplasia)   History of CVA with residual deficit   Generalized weakness   Near syncope   UTI (urinary tract infection)  Hospital Course: As per H&P written by Dr. Waldron Labs on 04/24/2022 Matthew Wise  is a 61 y.o. male, with past medical history of CVA, with residual left-sided weakness, PE, hypothyroidism, hypertension, hyperlipidemia, patient presents to ED secondary to complaints of generalized weakness and fall, patient reports she usually ambulates with a cane, sometimes walker, having difficulty with ambulation at baseline, usually he ambulates in a urinal, but reports he wears pull-up at nighttime or is difficult for him to go to the commode, report feeling weak today, and upon standing, he felt his legs giving up on him where he fell on the floor, he denies any head trauma, dizziness, lightheadedness or loss of consciousness, reports that this weakness resembles previous UTIs, he does report increased frequency and urgency. -ED patient was noted to be febrile of 103.1, white blood cell count of 10, his UA was strongly positive, he was started on Rocephin, and [facility]  hospitalist consulted to admit. Assessment and Plan: * Lower urinary tract infectious disease - No dysuria or hematuria reported -After fluid resuscitation and IV antibiotics patient feeling a whole lot better and wanting to go home to complete treatment by mouth. -Reports no nausea, no vomiting, he is afebrile and his WBCs are within normal limits at time of discharge. -Based on previous culture results has been discharged home on Duricef twice a day at to complete antibiotic therapy.  Obesity, Class I, BMI 30-34.9 - Low calorie diet, portion control and increase physical activity discussed with patient -Body mass index is 33.01 kg/m.   UTI (urinary tract infection) - Advised to keep himself well-hydrated and complete antibiotics as instructed -No hematuria reported.  Near syncope - No abnormalities appreciated on telemetry -Probably triggered from mild dehydration process. -After fluid resuscitation no near syncope or lightheadedness sensation reported.  Generalized weakness - In the setting of UTI in a patient with underlying residual weakness from CVA. -Continue home health PT for rehabilitation and conditioning -Advised to maintain adequate hydration and complete treatment with antibiotic for UTI.  History of CVA with residual deficit - Continue the use of aspirin, statins and Pradaxa for secondary prevention -No new focal deficits appreciated.  BPH (benign prostatic hyperplasia) - No complaints of urinary retention -Continue the use of Flomax.  Spastic hemiplegia affecting nondominant side (HCC) - Appears to be stable and at baseline -Continue home health services for rehabilitation and conditioning.  Hemiparesis affecting left side as late effect of stroke ([place]) -Continue risk factors modification. -Continue the use of aspirin and Pradaxa for secondary prevention.  Hypothyroidism - Continue the use of Synthroid.  Mixed hyperlipidemia - Continue  the use of his  statins.  Essential hypertension - Overall stable and well-controlled -Advised to follow heart healthy diet -Resume home antihypertensive regimen.  Chronic kidney disease, stage 3 unspecified (HCC) - Chronic kidney disease a stage IIIa at baseline -Creatinine is slightly higher than his normal range probably in the setting of UTI and prerenal azotemia. -After fluid resuscitation renal function back to baseline and is stable. -Advised to maintain adequate hydration and to take medications as prescribed. -Recommending a follow-up visit to repeat basic metabolic panel to follow electrolytes and renal function.   Consultants: None Procedures performed: See below for x-ray reports. Disposition: Home with home health services. Diet recommendation: Heart healthy diet.  DISCHARGE MEDICATION: Allergies as of 04/25/2022   No Known Allergies      Medication List     TAKE these medications    acetaminophen 325 MG tablet Commonly known as: TYLENOL Take 2 tablets (650 mg total) by mouth every 4 (four) hours as needed for mild pain (or temp > 37.5 C (99.5 F)).   amLODipine 5 MG tablet Commonly known as: NORVASC Take 1 tablet (5 mg total) by mouth daily.   aspirin EC 81 MG tablet Take 1 tablet (81 mg total) by mouth daily. Swallow whole.   atorvastatin 40 MG tablet Commonly known as: LIPITOR Take 1 tablet by mouth once daily   cefadroxil 500 MG capsule Commonly known as: DURICEF Take 1 capsule (500 mg total) by mouth 2 (two) times daily for 16 doses.   cetirizine 10 MG tablet Commonly known as: ZYRTEC Take 1 tablet (10 mg total) by mouth daily.   levETIRAcetam 500 MG tablet Commonly known as: KEPPRA Take 1 tablet (500 mg total) by mouth 2 (two) times daily.   levothyroxine 25 MCG tablet Commonly known as: SYNTHROID Take 1 tablet (25 mcg total) by mouth daily.   lisinopril 2.5 MG tablet Commonly known as: ZESTRIL Take 1 tablet (2.5 mg total) by mouth daily.    pantoprazole 40 MG tablet Commonly known as: PROTONIX TAKE 1 TABLET BY MOUTH EVERYDAY AT BEDTIME What changed: See the new instructions.   Pradaxa 150 MG Caps capsule Generic drug: dabigatran TAKE 1 CAPSULE BY MOUTH EVERY 12 HOURS   senna-docusate 8.6-50 MG tablet Commonly known as: Senokot-S Take 1 tablet by mouth at bedtime.   tamsulosin 0.4 MG Caps capsule Commonly known as: FLOMAX TAKE 1 CAPSULE BY MOUTH DAILY AFTER SUPPER What changed: See the new instructions.        Follow-up Information     Lindell Spar, MD. Schedule an appointment as soon as possible for a visit in 10 day(s).   Specialty: Internal Medicine Contact information: [address] [place] [address] [phone]                Discharge Exam: Danley Danker Weights   04/24/22 1305 04/24/22 1306  Weight: 109 kg 108.9 kg   General exam: Alert, awake, oriented x 3; no fever, no nausea, no vomiting, reports no dysuria.  Feeling better and wanting to go home. Respiratory system: Clear to auscultation. Respiratory effort normal.  Good saturation on room air. Cardiovascular system:RRR. No murmurs, rubs, gallops.  No JVD. Gastrointestinal system: Abdomen is nondistended, soft and nontender. No organomegaly or masses felt. Normal bowel sounds heard. Central nervous system: Alert and oriented. No new focal neurological deficits (left-sided hemiparesis affecting mainly upper extremities appreciated on exam). Extremities: No cyanosis or clubbing.  No edema. Skin: No petechiae. Psychiatry: Judgement and insight appear normal.  Mood & affect appropriate.    Condition at discharge: Stable and improved.  The results of significant diagnostics from this hospitalization (including imaging, microbiology, ancillary and laboratory) are listed below for reference.   Imaging Studies: DG Chest 2 View  Result Date: 04/24/2022 CLINICAL DATA:  Syncope x today. Left sided stroke x 2022. Could not raise left arm,  best obtainable images. syncope EXAM: CHEST - 2 VIEW COMPARISON:  None Available. FINDINGS: Normal mediastinum and cardiac silhouette. Normal pulmonary vasculature. No evidence of effusion, infiltrate, or pneumothorax. No acute bony abnormality. IMPRESSION: No acute cardiopulmonary process. Electronically Signed   By: Suzy Bouchard M.D.   On: 04/24/2022 15:07    Microbiology: Results for orders placed or performed during the hospital encounter of 04/24/22  Culture, blood (routine x 2)     Status: None (Preliminary result)   Collection Time: 04/24/22  4:36 PM   Specimen: BLOOD RIGHT HAND  Result Value Ref Range Status   Specimen Description   Final    BLOOD RIGHT HAND BOTTLES DRAWN AEROBIC AND ANAEROBIC   Special Requests Blood Culture adequate volume  Final   Culture   Final    NO GROWTH < 12 HOURS Performed at [facility], [address], [place], [place] [address]    Report Status PENDING  Incomplete  Culture, blood (routine x 2)     Status: None (Preliminary result)   Collection Time: 04/24/22  4:36 PM   Specimen: BLOOD RIGHT HAND  Result Value Ref Range Status   Specimen Description   Final    BLOOD RIGHT HAND BOTTLES DRAWN AEROBIC AND ANAEROBIC   Special Requests Blood Culture adequate volume  Final   Culture   Final    NO GROWTH < 12 HOURS Performed at [facility], [address] [place], [place] [address]    Report Status PENDING  Incomplete  SARS Coronavirus 2 by RT PCR (hospital order, performed in [place] hospital lab) *cepheid single result test* Anterior Nasal Swab     Status: None   Collection Time: 04/24/22  4:42 PM   Specimen: Anterior Nasal Swab  Result Value Ref Range Status   SARS Coronavirus 2 by RT PCR NEGATIVE NEGATIVE Final    Comment: (NOTE) SARS-CoV-2 target nucleic acids are NOT DETECTED.  The SARS-CoV-2 RNA is generally detectable in upper and lower respiratory specimens during the acute phase of infection. The lowest concentration of SARS-CoV-2  viral copies this assay can detect is 250 copies / mL. A negative result does not preclude SARS-CoV-2 infection and should not be used as the sole basis for treatment or other patient management decisions.  A negative result may occur with improper specimen collection / handling, submission of specimen other than nasopharyngeal swab, presence of viral mutation(s) within the areas targeted by this assay, and inadequate number of viral copies (<250 copies / mL). A negative result must be combined with clinical observations, patient history, and epidemiological information.  Fact Sheet for Patients:   https://www.patel.info/  Fact Sheet for Healthcare Providers: https://hall.com/  This test is not yet approved or  cleared by the [place] FDA and has been authorized for detection and/or diagnosis of SARS-CoV-2 by FDA under an Emergency Use Authorization (EUA).  This EUA will remain in effect (meaning this test can be used) for the duration of the COVID-19 declaration under Section 564(b)(1) of the Act, 21 U.S.C. section 360bbb-3(b)(1), unless the authorization is terminated or revoked sooner.  Performed at [facility], [address]  [place], [place] [address]     Labs: CBC: Recent Labs  Lab 04/24/22 1336 04/25/22 0500  WBC 10.5 9.5  HGB 14.0 13.2  HCT 42.1 39.5  MCV 82.5 82.1  PLT 178 758   Basic Metabolic Panel: Recent Labs  Lab 04/24/22 1336 04/25/22 0500  NA 140 140  K 3.6 3.4*  CL 106 107  CO2 29 26  GLUCOSE 113* 98  BUN 15 13  CREATININE 1.47* 1.38*  CALCIUM 9.4 9.1   CBG: Recent Labs  Lab 04/24/22 1334  GLUCAP 82    Discharge time spent: less than 30 minutes.  Signed: Barton Dubois, MD [facility] 04/25/2022

## 2022-04-25 NOTE — Assessment & Plan Note (Signed)
-   No dysuria or hematuria reported -After fluid resuscitation and IV antibiotics patient feeling a whole lot better and wanting to go home to complete treatment by mouth. -Reports no nausea, no vomiting, he is afebrile and his WBCs are within normal limits at time of discharge. -Based on previous culture results has been discharged home on Duricef twice a day at to complete antibiotic therapy.

## 2022-04-25 NOTE — Assessment & Plan Note (Signed)
-   No complaints of urinary retention -Continue the use of Flomax. 

## 2022-04-25 NOTE — Assessment & Plan Note (Signed)
-   Continue the use of aspirin, statins and Pradaxa for secondary prevention -No new focal deficits appreciated.

## 2022-04-25 NOTE — Assessment & Plan Note (Signed)
-   Appears to be stable and at baseline -Continue home health services for rehabilitation and conditioning.

## 2022-04-25 NOTE — Assessment & Plan Note (Signed)
-   Continue the use of his statins.

## 2022-04-25 NOTE — Care Management Obs Status (Signed)
[facility] NOTIFICATION   Patient Details  Name: Matthew Wise MRN: [id number] Date of Birth: 06-12-1961   Medicare Observation Status Notification Given:  Yes Given by R Tarpley CSW   Verdell Carmine, RN 04/25/2022, 4:52 PM

## 2022-04-25 NOTE — Assessment & Plan Note (Signed)
-   No abnormalities appreciated on telemetry -Probably triggered from mild dehydration process. -After fluid resuscitation no near syncope or lightheadedness sensation reported.

## 2022-04-27 LAB — URINE CULTURE: Culture: >=100000 — AB

## 2022-04-28 ENCOUNTER — Telehealth: Payer: Self-pay

## 2022-04-28 DIAGNOSIS — Z87891 Personal history of nicotine dependence: Secondary | ICD-10-CM | POA: Diagnosis not present

## 2022-04-28 DIAGNOSIS — Z86718 Personal history of other venous thrombosis and embolism: Secondary | ICD-10-CM | POA: Diagnosis not present

## 2022-04-28 DIAGNOSIS — Z993 Dependence on wheelchair: Secondary | ICD-10-CM | POA: Diagnosis not present

## 2022-04-28 DIAGNOSIS — K5909 Other constipation: Secondary | ICD-10-CM | POA: Diagnosis not present

## 2022-04-28 DIAGNOSIS — I69354 Hemiplegia and hemiparesis following cerebral infarction affecting left non-dominant side: Secondary | ICD-10-CM | POA: Diagnosis not present

## 2022-04-28 DIAGNOSIS — R7303 Prediabetes: Secondary | ICD-10-CM | POA: Diagnosis not present

## 2022-04-28 DIAGNOSIS — Z7982 Long term (current) use of aspirin: Secondary | ICD-10-CM | POA: Diagnosis not present

## 2022-04-28 DIAGNOSIS — E785 Hyperlipidemia, unspecified: Secondary | ICD-10-CM | POA: Diagnosis not present

## 2022-04-28 DIAGNOSIS — Z87440 Personal history of urinary (tract) infections: Secondary | ICD-10-CM | POA: Diagnosis not present

## 2022-04-28 DIAGNOSIS — I129 Hypertensive chronic kidney disease with stage 1 through stage 4 chronic kidney disease, or unspecified chronic kidney disease: Secondary | ICD-10-CM | POA: Diagnosis not present

## 2022-04-28 DIAGNOSIS — Z86711 Personal history of pulmonary embolism: Secondary | ICD-10-CM | POA: Diagnosis not present

## 2022-04-28 DIAGNOSIS — N1831 Chronic kidney disease, stage 3a: Secondary | ICD-10-CM | POA: Diagnosis not present

## 2022-04-28 DIAGNOSIS — I82221 Chronic embolism and thrombosis of inferior vena cava: Secondary | ICD-10-CM | POA: Diagnosis not present

## 2022-04-28 DIAGNOSIS — E039 Hypothyroidism, unspecified: Secondary | ICD-10-CM | POA: Diagnosis not present

## 2022-04-28 DIAGNOSIS — Z7902 Long term (current) use of antithrombotics/antiplatelets: Secondary | ICD-10-CM | POA: Diagnosis not present

## 2022-04-28 NOTE — Telephone Encounter (Signed)
Transition Care Management Follow-up Telephone Call Date of discharge and from where: 04/25/22 [facility]  How have you been since you were released from the hospital? good Any questions or concerns? Yes  Items Reviewed: Did the pt receive and understand the discharge instructions provided? Yes  Medications obtained and verified? Yes  Other?  N/a Any new allergies since your discharge? No  Dietary orders reviewed? Yes Do you have support at home? Yes   Home Care and Equipment/Supplies: Were home health services ordered? no If so, what is the name of the agency? N/a   Has the agency set up a time to come to the patient's home? not applicable Were any new equipment or medical supplies ordered?  No What is the name of the medical supply agency? N/a Were you able to get the supplies/equipment? no Do you have any questions related to the use of the equipment or supplies? No  Functional Questionnaire: (I = Independent and D = Dependent) ADLs: i  Bathing/Dressing- i  Meal Prep- i  Eating- i  Maintaining continence- i  Transferring/Ambulation- i  Managing Meds- i  Follow up appointments reviewed:  PCP Hospital f/u appt confirmed? Yes  Scheduled to see Posey Pronto on 05/01/22 @ 9:20. [facility] f/u appt confirmed? No  Are transportation arrangements needed? No  If their condition worsens, is the pt aware to call PCP or go to the Emergency Dept.? Yes Was the patient provided with contact information for the PCP's office or ED? Yes Was to pt encouraged to call back with questions or concerns? Yes

## 2022-04-29 LAB — CULTURE, BLOOD (ROUTINE X 2)
Culture: NO GROWTH
Culture: NO GROWTH
Special Requests: ADEQUATE
Special Requests: ADEQUATE

## 2022-04-30 ENCOUNTER — Ambulatory Visit (INDEPENDENT_AMBULATORY_CARE_PROVIDER_SITE_OTHER): Payer: Medicare Other | Admitting: Internal Medicine

## 2022-04-30 ENCOUNTER — Encounter: Payer: Self-pay | Admitting: Internal Medicine

## 2022-04-30 VITALS — BP 102/64 | HR 44 | Resp 18 | Ht 72.0 in | Wt 240.0 lb

## 2022-04-30 DIAGNOSIS — N390 Urinary tract infection, site not specified: Secondary | ICD-10-CM

## 2022-04-30 DIAGNOSIS — R55 Syncope and collapse: Secondary | ICD-10-CM

## 2022-04-30 DIAGNOSIS — Z09 Encounter for follow-up examination after completed treatment for conditions other than malignant neoplasm: Secondary | ICD-10-CM

## 2022-04-30 NOTE — Progress Notes (Signed)
Established Patient Office Visit  Subjective:  Patient ID: Matthew Wise, male    DOB: 1961/08/30  Age: 61 y.o. MRN: [id number]  CC:  Chief Complaint  Patient presents with   Hospitalization Follow-up    Pt was in [place] discharged 04-25-22 hospital thought UTI pt has been feeling good since being home     HPI Matthew Wise is a 61 y.o. 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  He was admitted from 04/24/22-04/25/22 for UTI.  He was started on Rocephin for UTI, and was discharged on oral cefadroxil.  His urine culture showed E. coli.  Blood culture was negative.  He had AKI on CKD, which improved with IV hydration.  He has been doing well overall since being discharged from hospital.  He denies any dysuria, hematuria, fever or chills currently.  Denies any nausea or vomiting currently.  He has started walking with the help of cane.  He has started physical therapy as well.       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 two simple tubular adenomas removed. patient was advised to go to [facility] for colonoscopy but he did not keep appt.   IR CT HEAD LTD  02/02/2021   IR PERCUTANEOUS ART THROMBECTOMY/INFUSION INTRACRANIAL INC DIAG ANGIO  02/02/2021   RADIOLOGY WITH ANESTHESIA N/A 02/02/2021   Procedure: IR WITH ANESTHESIA;  Surgeon: Luanne Bras, MD;  Location: [facility];  Service: Radiology;  Laterality: N/A;   TEE WITHOUT CARDIOVERSION N/A 09/10/2020   Procedure: TRANSESOPHAGEAL ECHOCARDIOGRAM (TEE) WITH PROPOFOL;  Surgeon: Satira Sark, MD;  Location: [facility];  Service: Cardiovascular;  Laterality: N/A;    Family History  Problem Relation Age of Onset   COPD Father    Stroke Brother    Pulmonary embolism Sister    Throat cancer Brother    Arthritis Mother    Diabetes Mother    Stroke Mother    Hypertension Brother    Diabetes Brother    Arthritis Brother    Diabetes Sister    Colon cancer Neg Hx     Social History   Socioeconomic History   Marital status: Divorced    Spouse name: Not on file   Number of children: 2   Years  of education: 12   Highest education level: Not on file  Occupational History   Occupation: disability    Comment: DVT  Tobacco Use   Smoking status: Former    Packs/day: 1.00    Years: 30.00    Pack years: 30.00    Types: Cigarettes    Start date: 12/01/1983   Smokeless tobacco: Never  Vaping Use   Vaping Use: Never used  Substance and Sexual Activity   Alcohol use: Not Currently    Alcohol/week: 3.0 standard drinks    Types: 3 Cans of beer per week    Comment: has not drank sice 11/27/20   Drug use: Not Currently    Comment: REMOTE HISTORY, Quit in 2007    Sexual activity: Yes  Other Topics Concern   Not on file  Social History Narrative   Lives with sister   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   Right Handed    Drinks caffeine on occassion   Social Determinants of Health   Financial Resource Strain: Low Risk    Difficulty of Paying Living Expenses: Not hard at all  Food Insecurity: No Food Insecurity   Worried About Charity fundraiser in the Last Year: Never true   Arboriculturist in the Last Year: Never true  Transportation Needs: No Transportation Needs   Lack of Transportation (Medical): No   Lack of Transportation (Non-Medical): No  Physical Activity: Insufficiently Active   Days of Exercise per Week: 5 days   Minutes of Exercise per Session: 20 min  Stress: No Stress Concern Present   Feeling of Stress : Only a little  Social Connections: Moderately Isolated   Frequency of Communication with Friends and Family: More than three times a week   Frequency of Social Gatherings with Friends and Family: More than three times a week   Attends Religious Services: 1 to 4 times per year   Active Member of [employer] or Organizations: No   Attends Archivist Meetings: Never   Marital Status: Divorced  Human resources officer Violence: Not At Risk   Fear of Current or Ex-Partner: No   Emotionally Abused: No   Physically Abused: No   Sexually Abused: No    Outpatient Medications Prior to Visit  Medication Sig Dispense Refill   acetaminophen (TYLENOL) 325 MG tablet Take 2 tablets (650 mg total) by mouth every 4 (four) hours as needed for mild pain (or temp > 37.5 C (99.5 F)).     amLODipine (NORVASC) 5 MG tablet Take 1 tablet (5 mg total) by mouth daily. 90 tablet 1   aspirin EC 81 MG EC tablet Take 1 tablet (81 mg total) by mouth daily. Swallow whole. 30 tablet 11   atorvastatin (LIPITOR) 40 MG tablet Take 1 tablet by mouth once daily 90 tablet 0   cefadroxil (DURICEF) 500 MG capsule Take 1 capsule (500 mg total) by mouth 2  (two) times daily for 16 doses. 16 capsule 0   cetirizine (ZYRTEC) 10 MG tablet Take 1 tablet (10 mg total) by mouth daily. 30 tablet 11   levETIRAcetam (KEPPRA) 500 MG tablet Take 1 tablet (500 mg total) by mouth 2 (two) times daily. 180 tablet 3   levothyroxine (SYNTHROID) 25 MCG tablet Take 1 tablet (25 mcg total) by mouth daily. 90 tablet 0   lisinopril (ZESTRIL) 2.5 MG tablet Take 1 tablet (2.5 mg total) by mouth daily. 90 tablet 1   pantoprazole (PROTONIX) 40 MG tablet TAKE 1  TABLET BY MOUTH EVERYDAY AT BEDTIME (Patient taking differently: Take 40 mg by mouth at bedtime.) 90 tablet 1   PRADAXA 150 MG CAPS capsule TAKE 1 CAPSULE BY MOUTH EVERY 12 HOURS 60 capsule 5   senna-docusate (SENOKOT-S) 8.6-50 MG tablet Take 1 tablet by mouth at bedtime.     tamsulosin (FLOMAX) 0.4 MG CAPS capsule TAKE 1 CAPSULE BY MOUTH DAILY AFTER SUPPER (Patient taking differently: Take 0.4 mg by mouth daily after supper.) 90 capsule 0   No facility-administered medications prior to visit.    No Known Allergies  ROS Review of Systems  Constitutional:  Positive for fatigue. Negative for chills and fever.  HENT:  Negative for congestion, postnasal drip, rhinorrhea and sore throat.   Eyes:  Negative for pain and discharge.  Respiratory:  Negative for cough and shortness of breath.   Cardiovascular:  Negative for chest pain and palpitations.  Gastrointestinal:  Positive for constipation. Negative for diarrhea, nausea and vomiting.  Endocrine: Negative for polydipsia and polyuria.  Genitourinary:  Negative for dysuria and hematuria.  Musculoskeletal:  Positive for arthralgias and neck stiffness. Negative for neck pain.  Skin:  Negative for rash.  Neurological:  Positive for weakness and numbness. Negative for dizziness and headaches.  Psychiatric/Behavioral:  Negative for agitation and behavioral problems.      Objective:    Physical Exam Vitals reviewed.  Constitutional:      General: He is not in acute  distress.    Appearance: He is not diaphoretic.     Comments: In wheelchair  HENT:     Head: Normocephalic and atraumatic.     Nose: No congestion.     Mouth/Throat:     Mouth: Mucous membranes are moist.  Eyes:     General: No scleral icterus.    Extraocular Movements: Extraocular movements intact.  Cardiovascular:     Rate and Rhythm: Normal rate and regular rhythm.     Pulses: Normal pulses.     Heart sounds: Normal heart sounds. No murmur heard. Pulmonary:     Breath sounds: Normal breath sounds. No wheezing or rales.  Abdominal:     Palpations: Abdomen is soft.     Tenderness: There is no abdominal tenderness. There is no right CVA tenderness, left CVA tenderness, guarding or rebound.  Musculoskeletal:     Cervical back: Neck supple. No tenderness.     Right lower leg: No edema.     Left lower leg: No edema.  Skin:    General: Skin is warm.     Findings: No rash.  Neurological:     General: No focal deficit present.     Mental Status: He is alert and oriented to person, place, and time.     Sensory: Sensory deficit (Left sided hemiparesis) present.     Motor: Weakness (Left UE and LE 1/5) present.  Psychiatric:        Mood and Affect: Mood normal.        Behavior: Behavior normal.    BP 102/64 (BP Location: Right Arm, Patient Position: Sitting, Cuff Size: Normal)   Pulse (!) 44   Resp 18   Ht 6' (1.829 m)   Wt 240 lb (108.9 kg)   SpO2 90%   BMI 32.55 kg/m  Wt Readings from Last 3 Encounters:  04/30/22 240 lb (108.9 kg)  04/24/22 240 lb (108.9 kg)  03/26/22 240 lb 6.4 oz (109 kg)    Lab Results  Component Value Date   TSH 3.840 03/25/2022  Lab Results  Component Value Date   WBC 9.5 04/25/2022   HGB 13.2 04/25/2022   HCT 39.5 04/25/2022   MCV 82.1 04/25/2022   PLT 173 04/25/2022   Lab Results  Component Value Date   NA 140 04/25/2022   K 3.4 (L) 04/25/2022   CO2 26 04/25/2022   GLUCOSE 98 04/25/2022   BUN 13 04/25/2022   CREATININE 1.38 (H)  04/25/2022   BILITOT 1.3 (H) 02/17/2022   ALKPHOS 123 02/17/2022   AST 23 02/17/2022   ALT 41 02/17/2022   PROT 7.9 02/17/2022   ALBUMIN 4.1 02/17/2022   CALCIUM 9.1 04/25/2022   ANIONGAP 7 04/25/2022   EGFR 53 (L) 03/25/2022   Lab Results  Component Value Date   CHOL 119 09/24/2021   Lab Results  Component Value Date   HDL 48 09/24/2021   Lab Results  Component Value Date   LDLCALC 57 09/24/2021   Lab Results  Component Value Date   TRIG 63 09/24/2021   Lab Results  Component Value Date   CHOLHDL 2.5 09/24/2021   Lab Results  Component Value Date   HGBA1C 5.9 (H) 02/03/2021      Assessment & Plan:   Problem List Items Addressed This Visit       Cardiovascular and Mediastinum   Near syncope    Likely due to mild dehydration in the setting of UTI Improved with IV fluids         Genitourinary   UTI (urinary tract infection) - Primary    On oral cefadroxil Urine culture showed E. coli Blood culture negative Advised to maintain adequate hydration         Other   Hospital discharge follow-up    Hospital chart reviewed, including discharge summary Had UTI -treated with IV antibiotic followed by oral antibiotic, currently on cefadroxil Medications reconciled and reviewed with the patient Check urine culture after completion of antibiotic Check BMP       Relevant Orders   Basic Metabolic Panel (BMET)    No orders of the defined types were placed in this encounter.   Follow-up: Return if symptoms worsen or fail to improve.    Lindell Spar, MD

## 2022-04-30 NOTE — Patient Instructions (Signed)
Please complete the course of antibiotic.  Please continue to take other medications as prescribed.  Please continue to follow low salt diet and ambulate as tolerated. Please maintain at least 64 ounces of fluid intake.

## 2022-04-30 NOTE — Assessment & Plan Note (Signed)
On oral cefadroxil Urine culture showed E. coli Blood culture negative Advised to maintain adequate hydration

## 2022-04-30 NOTE — Assessment & Plan Note (Addendum)
Hospital chart reviewed, including discharge summary Had UTI -treated with IV antibiotic followed by oral antibiotic, currently on cefadroxil Medications reconciled and reviewed with the patient Check urine culture after completion of antibiotic Check BMP

## 2022-04-30 NOTE — Assessment & Plan Note (Signed)
Likely due to mild dehydration in the setting of UTI Improved with IV fluids

## 2022-05-01 ENCOUNTER — Inpatient Hospital Stay: Payer: Medicare Other | Admitting: Internal Medicine

## 2022-05-01 LAB — BASIC METABOLIC PANEL
BUN/Creatinine Ratio: 10 (ref 9–20)
BUN: 14 mg/dL (ref 6–24)
CO2: 24 mmol/L (ref 20–29)
Calcium: 9.1 mg/dL (ref 8.7–10.2)
Chloride: 99 mmol/L (ref 96–106)
Creatinine, Ser: 1.44 mg/dL — ABNORMAL HIGH (ref 0.76–1.27)
Glucose: 42 mg/dL — ABNORMAL LOW (ref 70–99)
Potassium: 4.6 mmol/L (ref 3.5–5.2)
Sodium: 141 mmol/L (ref 134–144)
eGFR: 56 mL/min/{1.73_m2} — ABNORMAL LOW (ref >59)

## 2022-05-04 DIAGNOSIS — Z86718 Personal history of other venous thrombosis and embolism: Secondary | ICD-10-CM | POA: Diagnosis not present

## 2022-05-04 DIAGNOSIS — Z7902 Long term (current) use of antithrombotics/antiplatelets: Secondary | ICD-10-CM | POA: Diagnosis not present

## 2022-05-04 DIAGNOSIS — Z7982 Long term (current) use of aspirin: Secondary | ICD-10-CM | POA: Diagnosis not present

## 2022-05-04 DIAGNOSIS — N1831 Chronic kidney disease, stage 3a: Secondary | ICD-10-CM | POA: Diagnosis not present

## 2022-05-04 DIAGNOSIS — Z87440 Personal history of urinary (tract) infections: Secondary | ICD-10-CM | POA: Diagnosis not present

## 2022-05-04 DIAGNOSIS — I82221 Chronic embolism and thrombosis of inferior vena cava: Secondary | ICD-10-CM | POA: Diagnosis not present

## 2022-05-04 DIAGNOSIS — Z993 Dependence on wheelchair: Secondary | ICD-10-CM | POA: Diagnosis not present

## 2022-05-04 DIAGNOSIS — Z86711 Personal history of pulmonary embolism: Secondary | ICD-10-CM | POA: Diagnosis not present

## 2022-05-04 DIAGNOSIS — E785 Hyperlipidemia, unspecified: Secondary | ICD-10-CM | POA: Diagnosis not present

## 2022-05-04 DIAGNOSIS — I129 Hypertensive chronic kidney disease with stage 1 through stage 4 chronic kidney disease, or unspecified chronic kidney disease: Secondary | ICD-10-CM | POA: Diagnosis not present

## 2022-05-04 DIAGNOSIS — R7303 Prediabetes: Secondary | ICD-10-CM | POA: Diagnosis not present

## 2022-05-04 DIAGNOSIS — I69354 Hemiplegia and hemiparesis following cerebral infarction affecting left non-dominant side: Secondary | ICD-10-CM | POA: Diagnosis not present

## 2022-05-04 DIAGNOSIS — Z87891 Personal history of nicotine dependence: Secondary | ICD-10-CM | POA: Diagnosis not present

## 2022-05-04 DIAGNOSIS — K5909 Other constipation: Secondary | ICD-10-CM | POA: Diagnosis not present

## 2022-05-04 DIAGNOSIS — E039 Hypothyroidism, unspecified: Secondary | ICD-10-CM | POA: Diagnosis not present

## 2022-05-06 DIAGNOSIS — Z87891 Personal history of nicotine dependence: Secondary | ICD-10-CM | POA: Diagnosis not present

## 2022-05-06 DIAGNOSIS — Z86711 Personal history of pulmonary embolism: Secondary | ICD-10-CM | POA: Diagnosis not present

## 2022-05-06 DIAGNOSIS — Z7902 Long term (current) use of antithrombotics/antiplatelets: Secondary | ICD-10-CM | POA: Diagnosis not present

## 2022-05-06 DIAGNOSIS — K5909 Other constipation: Secondary | ICD-10-CM | POA: Diagnosis not present

## 2022-05-06 DIAGNOSIS — I82221 Chronic embolism and thrombosis of inferior vena cava: Secondary | ICD-10-CM | POA: Diagnosis not present

## 2022-05-06 DIAGNOSIS — N1831 Chronic kidney disease, stage 3a: Secondary | ICD-10-CM | POA: Diagnosis not present

## 2022-05-06 DIAGNOSIS — Z86718 Personal history of other venous thrombosis and embolism: Secondary | ICD-10-CM | POA: Diagnosis not present

## 2022-05-06 DIAGNOSIS — Z87440 Personal history of urinary (tract) infections: Secondary | ICD-10-CM | POA: Diagnosis not present

## 2022-05-06 DIAGNOSIS — E785 Hyperlipidemia, unspecified: Secondary | ICD-10-CM | POA: Diagnosis not present

## 2022-05-06 DIAGNOSIS — R7303 Prediabetes: Secondary | ICD-10-CM | POA: Diagnosis not present

## 2022-05-06 DIAGNOSIS — I69354 Hemiplegia and hemiparesis following cerebral infarction affecting left non-dominant side: Secondary | ICD-10-CM | POA: Diagnosis not present

## 2022-05-06 DIAGNOSIS — E039 Hypothyroidism, unspecified: Secondary | ICD-10-CM | POA: Diagnosis not present

## 2022-05-06 DIAGNOSIS — I129 Hypertensive chronic kidney disease with stage 1 through stage 4 chronic kidney disease, or unspecified chronic kidney disease: Secondary | ICD-10-CM | POA: Diagnosis not present

## 2022-05-06 DIAGNOSIS — Z993 Dependence on wheelchair: Secondary | ICD-10-CM | POA: Diagnosis not present

## 2022-05-06 DIAGNOSIS — Z7982 Long term (current) use of aspirin: Secondary | ICD-10-CM | POA: Diagnosis not present

## 2022-05-07 ENCOUNTER — Encounter: Payer: Medicare Other | Admitting: Physical Medicine & Rehabilitation

## 2022-05-07 DIAGNOSIS — Z7982 Long term (current) use of aspirin: Secondary | ICD-10-CM | POA: Diagnosis not present

## 2022-05-07 DIAGNOSIS — I129 Hypertensive chronic kidney disease with stage 1 through stage 4 chronic kidney disease, or unspecified chronic kidney disease: Secondary | ICD-10-CM | POA: Diagnosis not present

## 2022-05-07 DIAGNOSIS — I82221 Chronic embolism and thrombosis of inferior vena cava: Secondary | ICD-10-CM | POA: Diagnosis not present

## 2022-05-07 DIAGNOSIS — N1831 Chronic kidney disease, stage 3a: Secondary | ICD-10-CM | POA: Diagnosis not present

## 2022-05-07 DIAGNOSIS — Z993 Dependence on wheelchair: Secondary | ICD-10-CM | POA: Diagnosis not present

## 2022-05-07 DIAGNOSIS — Z7902 Long term (current) use of antithrombotics/antiplatelets: Secondary | ICD-10-CM | POA: Diagnosis not present

## 2022-05-07 DIAGNOSIS — I69354 Hemiplegia and hemiparesis following cerebral infarction affecting left non-dominant side: Secondary | ICD-10-CM | POA: Diagnosis not present

## 2022-05-07 DIAGNOSIS — Z87440 Personal history of urinary (tract) infections: Secondary | ICD-10-CM | POA: Diagnosis not present

## 2022-05-07 DIAGNOSIS — Z86711 Personal history of pulmonary embolism: Secondary | ICD-10-CM | POA: Diagnosis not present

## 2022-05-07 DIAGNOSIS — E039 Hypothyroidism, unspecified: Secondary | ICD-10-CM | POA: Diagnosis not present

## 2022-05-07 DIAGNOSIS — E785 Hyperlipidemia, unspecified: Secondary | ICD-10-CM | POA: Diagnosis not present

## 2022-05-07 DIAGNOSIS — Z87891 Personal history of nicotine dependence: Secondary | ICD-10-CM | POA: Diagnosis not present

## 2022-05-07 DIAGNOSIS — Z86718 Personal history of other venous thrombosis and embolism: Secondary | ICD-10-CM | POA: Diagnosis not present

## 2022-05-07 DIAGNOSIS — R7303 Prediabetes: Secondary | ICD-10-CM | POA: Diagnosis not present

## 2022-05-07 DIAGNOSIS — K5909 Other constipation: Secondary | ICD-10-CM | POA: Diagnosis not present

## 2022-05-11 DIAGNOSIS — E785 Hyperlipidemia, unspecified: Secondary | ICD-10-CM | POA: Diagnosis not present

## 2022-05-11 DIAGNOSIS — K5909 Other constipation: Secondary | ICD-10-CM | POA: Diagnosis not present

## 2022-05-11 DIAGNOSIS — Z993 Dependence on wheelchair: Secondary | ICD-10-CM | POA: Diagnosis not present

## 2022-05-11 DIAGNOSIS — Z86711 Personal history of pulmonary embolism: Secondary | ICD-10-CM | POA: Diagnosis not present

## 2022-05-11 DIAGNOSIS — I69354 Hemiplegia and hemiparesis following cerebral infarction affecting left non-dominant side: Secondary | ICD-10-CM | POA: Diagnosis not present

## 2022-05-11 DIAGNOSIS — Z86718 Personal history of other venous thrombosis and embolism: Secondary | ICD-10-CM | POA: Diagnosis not present

## 2022-05-11 DIAGNOSIS — E039 Hypothyroidism, unspecified: Secondary | ICD-10-CM | POA: Diagnosis not present

## 2022-05-11 DIAGNOSIS — N1831 Chronic kidney disease, stage 3a: Secondary | ICD-10-CM | POA: Diagnosis not present

## 2022-05-11 DIAGNOSIS — I129 Hypertensive chronic kidney disease with stage 1 through stage 4 chronic kidney disease, or unspecified chronic kidney disease: Secondary | ICD-10-CM | POA: Diagnosis not present

## 2022-05-11 DIAGNOSIS — Z87440 Personal history of urinary (tract) infections: Secondary | ICD-10-CM | POA: Diagnosis not present

## 2022-05-11 DIAGNOSIS — I82221 Chronic embolism and thrombosis of inferior vena cava: Secondary | ICD-10-CM | POA: Diagnosis not present

## 2022-05-11 DIAGNOSIS — Z7982 Long term (current) use of aspirin: Secondary | ICD-10-CM | POA: Diagnosis not present

## 2022-05-11 DIAGNOSIS — Z87891 Personal history of nicotine dependence: Secondary | ICD-10-CM | POA: Diagnosis not present

## 2022-05-11 DIAGNOSIS — Z7902 Long term (current) use of antithrombotics/antiplatelets: Secondary | ICD-10-CM | POA: Diagnosis not present

## 2022-05-11 DIAGNOSIS — R7303 Prediabetes: Secondary | ICD-10-CM | POA: Diagnosis not present

## 2022-05-12 ENCOUNTER — Other Ambulatory Visit (INDEPENDENT_AMBULATORY_CARE_PROVIDER_SITE_OTHER): Payer: Self-pay | Admitting: Nurse Practitioner

## 2022-05-12 DIAGNOSIS — D6851 Activated protein C resistance: Secondary | ICD-10-CM

## 2022-05-12 DIAGNOSIS — Z7901 Long term (current) use of anticoagulants: Secondary | ICD-10-CM

## 2022-05-12 DIAGNOSIS — Z8673 Personal history of transient ischemic attack (TIA), and cerebral infarction without residual deficits: Secondary | ICD-10-CM

## 2022-05-12 DIAGNOSIS — I10 Essential (primary) hypertension: Secondary | ICD-10-CM

## 2022-05-21 ENCOUNTER — Other Ambulatory Visit: Payer: Self-pay | Admitting: *Deleted

## 2022-05-21 ENCOUNTER — Telehealth: Payer: Self-pay | Admitting: Internal Medicine

## 2022-05-21 DIAGNOSIS — Z8673 Personal history of transient ischemic attack (TIA), and cerebral infarction without residual deficits: Secondary | ICD-10-CM

## 2022-05-21 DIAGNOSIS — I10 Essential (primary) hypertension: Secondary | ICD-10-CM

## 2022-05-21 DIAGNOSIS — Z7901 Long term (current) use of anticoagulants: Secondary | ICD-10-CM

## 2022-05-21 DIAGNOSIS — D6851 Activated protein C resistance: Secondary | ICD-10-CM

## 2022-05-21 MED ORDER — PANTOPRAZOLE SODIUM 40 MG PO TBEC
40.0000 mg | DELAYED_RELEASE_TABLET | Freq: Every day | ORAL | 3 refills | Status: DC
Start: 2022-05-21 — End: 2022-08-14

## 2022-05-21 NOTE — Telephone Encounter (Signed)
Patient need refill on   pantoprazole (PROTONIX) 40 MG tablet

## 2022-05-21 NOTE — Telephone Encounter (Signed)
Medication sent to pharmacy  

## 2022-05-22 ENCOUNTER — Telehealth: Payer: Self-pay | Admitting: Internal Medicine

## 2022-05-22 NOTE — Telephone Encounter (Signed)
Pt sister called wanting to know if he can please be referred back to Outpatient Therapy. He just finished his home therapy. Can this be done?

## 2022-05-25 ENCOUNTER — Other Ambulatory Visit: Payer: Self-pay | Admitting: *Deleted

## 2022-05-25 ENCOUNTER — Other Ambulatory Visit: Payer: Self-pay | Admitting: Internal Medicine

## 2022-05-25 DIAGNOSIS — I69354 Hemiplegia and hemiparesis following cerebral infarction affecting left non-dominant side: Secondary | ICD-10-CM

## 2022-05-25 DIAGNOSIS — Z1211 Encounter for screening for malignant neoplasm of colon: Secondary | ICD-10-CM

## 2022-05-25 DIAGNOSIS — N1831 Chronic kidney disease, stage 3a: Secondary | ICD-10-CM

## 2022-05-25 DIAGNOSIS — G8110 Spastic hemiplegia affecting unspecified side: Secondary | ICD-10-CM

## 2022-06-03 ENCOUNTER — Other Ambulatory Visit: Payer: Self-pay | Admitting: Internal Medicine

## 2022-06-03 DIAGNOSIS — Z1211 Encounter for screening for malignant neoplasm of colon: Secondary | ICD-10-CM

## 2022-06-03 DIAGNOSIS — Z8673 Personal history of transient ischemic attack (TIA), and cerebral infarction without residual deficits: Secondary | ICD-10-CM

## 2022-06-03 DIAGNOSIS — N1831 Chronic kidney disease, stage 3a: Secondary | ICD-10-CM

## 2022-06-03 DIAGNOSIS — Z7901 Long term (current) use of anticoagulants: Secondary | ICD-10-CM

## 2022-06-03 DIAGNOSIS — I10 Essential (primary) hypertension: Secondary | ICD-10-CM

## 2022-06-03 DIAGNOSIS — R7303 Prediabetes: Secondary | ICD-10-CM

## 2022-06-03 DIAGNOSIS — D6851 Activated protein C resistance: Secondary | ICD-10-CM

## 2022-06-09 ENCOUNTER — Telehealth: Payer: Self-pay | Admitting: Adult Health

## 2022-06-09 NOTE — Telephone Encounter (Signed)
Called pt to inform of need to reschedule 7/19 appointment, pt stated he would call back to reschedule - NP out

## 2022-06-15 ENCOUNTER — Other Ambulatory Visit: Payer: Self-pay | Admitting: Internal Medicine

## 2022-06-15 DIAGNOSIS — E038 Other specified hypothyroidism: Secondary | ICD-10-CM

## 2022-06-17 ENCOUNTER — Ambulatory Visit: Payer: Commercial Managed Care - HMO | Admitting: Adult Health

## 2022-06-18 ENCOUNTER — Ambulatory Visit: Payer: Commercial Managed Care - HMO | Admitting: Adult Health

## 2022-06-26 ENCOUNTER — Encounter: Payer: Medicare Other | Attending: Physical Medicine & Rehabilitation | Admitting: Physical Medicine & Rehabilitation

## 2022-06-26 ENCOUNTER — Encounter: Payer: Self-pay | Admitting: Physical Medicine & Rehabilitation

## 2022-06-26 VITALS — BP 112/71 | HR 68 | Ht 72.0 in

## 2022-06-26 DIAGNOSIS — G8110 Spastic hemiplegia affecting unspecified side: Secondary | ICD-10-CM | POA: Insufficient documentation

## 2022-06-26 NOTE — Patient Instructions (Addendum)
Please call if you are having problems with muscle tone once you get back into therapy

## 2022-06-26 NOTE — Progress Notes (Signed)
Botox Injection for spasticity using needle EMG guidance 61 y.o. right-handed male with history of pulmonary emboli maintained on Eliquis, hypertension, CKD stage III, hyperlipidemia, prediabetes, sickle cell disease, tobacco abuse as well as history of right MCA territory infarction admission 01/21/2019 until 01/27/2021 and discharged home ambulating 150 feet rolling walker.  Patient lives alone 1 level home 3 steps to entry.  Independent with assistive device.  He has a sister who checks on him regularly.  Presented 02/03/2021 with left hemiparesis, subacute onset.  Cranial CT scan unremarkable for acute intracranial process.  Suspect acute infarct of the right frontal parietal lobe posterior superior to chronic infarct with probable involvement of lateral precentral and postcentral gyri.  Patient did not receive TPA.  CT angiogram of head and neck showed abrupt occlusion of the right M1 segment new as compared to prior CTA of 01/25/2021.  Acute core infarct of the posterior right frontal parietal region largely corresponding with a completed infarct as seen on prior MRI.  EEG evidence of focal seizure arising from the right posterior quadrant during which patient was noted to have left upper extremity twitching.  Additionally there was cortical dysfunction in the right frontal temporal region likely due to underlying stroke.  Most recent echocardiogram with ejection fraction of 55 to 60%.  Admission chemistries unremarkable except creatinine 1.83 urine drug screen negative hemoglobin A1c 5.9 alcohol negative.  Patient underwent arteriogram with revascularization of right MCA M1 segment per interventional radiology.  Latest follow-up MRI showed large acute right MCA territory infarct with petechial hemorrhage and minimal leftward midline shift as well as a small acute left cerebellar infarct new small acute early subacute infarct in left occipital left parietal left frontal lobes.  Bilateral extremity Dopplers finding  consistent with age-indeterminate DVT involving the right common femoral vein and right femoral vein as well as left femoral vein and left popliteal vein, left common femoral and SFA junction.  Patient was placed on intravenous heparin remained on low-dose aspirin and Pradaxa was added.  He had been loaded with Keppra for suspected seizure.  Hospital course further complicated by dysphagia currently on a mechanical soft diet.  Admit date: 02/12/2021 Discharge date: 03/12/2021  Patient here for spasticity management.  Has been getting botulinum toxin injection to the pectoralis elbow flexors as well as finger flexors.  Last injection was performed in February 2023.  Patient states he is not sure it did much for him.     Tone at last visit in April 2023 MAS 1-1+ in the finger and wrist flexors MAS 2 at the elbow flexors  Motor strength today no active finger or wrist flexion extension left upper extremity trace elbow flexion 0 at the shoulder abductors.  Left lower extremity 3 - hip flexor 4 - knee extensor 0 ankle dorsiflexor.  Tone today MAS 2 at the elbow flexors on the left 1 at the finger flexors 1 at the wrist flexors and 3 at the pectoralis.  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 He will be restarting physical therapy soon.  He has tone in the lower extremity is relatively normal.  Left upper extremity tone is elevated but mainly at the pectoralis.  We discussed using Botox today for pectoralis tone however he did not wish to pursue this at the current time.  We discussed that if his tone becomes more problematic and affects his therapy I be happy to see him back again but will not schedule a visit at this time.  Discussed this with patient as well as his son who is in the room with him.

## 2022-07-06 ENCOUNTER — Ambulatory Visit (INDEPENDENT_AMBULATORY_CARE_PROVIDER_SITE_OTHER): Payer: Medicare Other | Admitting: Internal Medicine

## 2022-07-06 ENCOUNTER — Encounter: Payer: Self-pay | Admitting: Internal Medicine

## 2022-07-06 VITALS — BP 102/72 | Resp 16 | Ht 72.0 in

## 2022-07-06 DIAGNOSIS — E782 Mixed hyperlipidemia: Secondary | ICD-10-CM | POA: Diagnosis not present

## 2022-07-06 DIAGNOSIS — R351 Nocturia: Secondary | ICD-10-CM

## 2022-07-06 DIAGNOSIS — R7303 Prediabetes: Secondary | ICD-10-CM

## 2022-07-06 DIAGNOSIS — Z0001 Encounter for general adult medical examination with abnormal findings: Secondary | ICD-10-CM | POA: Diagnosis not present

## 2022-07-06 DIAGNOSIS — E559 Vitamin D deficiency, unspecified: Secondary | ICD-10-CM

## 2022-07-06 DIAGNOSIS — N1831 Chronic kidney disease, stage 3a: Secondary | ICD-10-CM | POA: Diagnosis not present

## 2022-07-06 DIAGNOSIS — E039 Hypothyroidism, unspecified: Secondary | ICD-10-CM

## 2022-07-06 DIAGNOSIS — N401 Enlarged prostate with lower urinary tract symptoms: Secondary | ICD-10-CM

## 2022-07-06 DIAGNOSIS — I10 Essential (primary) hypertension: Secondary | ICD-10-CM | POA: Diagnosis not present

## 2022-07-06 NOTE — Assessment & Plan Note (Signed)
Lab Results  Component Value Date   TSH 3.840 03/25/2022   On Levothyroxine 25 mcg QD Check TSH and free T4 

## 2022-07-06 NOTE — Assessment & Plan Note (Signed)
On Flomax 

## 2022-07-06 NOTE — Assessment & Plan Note (Signed)
Physical exam as documented. Fasting blood tests ordered. 

## 2022-07-06 NOTE — Assessment & Plan Note (Signed)
BP Readings from Last 1 Encounters:  07/06/22 102/72   Well-controlled with amlodipine and lisinopril Counseled for compliance with the medications Advised DASH diet

## 2022-07-06 NOTE — Patient Instructions (Addendum)
Please continue taking medications as prescribed.  Please continue to follow low salt diet and ambulate as tolerated.  Please consider getting Shingrix vaccine at your local pharmacy.  Please get fasting blood tests done within 1 week.

## 2022-07-06 NOTE — Assessment & Plan Note (Signed)
Advised to follow low carb diet for now 

## 2022-07-06 NOTE — Progress Notes (Signed)
Established Patient Office Visit  Subjective:  Patient ID: Matthew Wise, male    DOB: [birth year]  Age: 61 y.o. MRN: [id number]  CC:  Chief Complaint  Patient presents with   Annual Exam    Annual exam     HPI Matthew Wise is a 61 y.o. 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    HTN: BP is well controlled now.  He is taking amlodipine 5 mg daily and lisinopril 2.5 mg daily.  He denies any headache, dizziness, chest pain or palpitations.  CKD: He currently denies any dysuria, hematuria, urinary hesitancy or resistance.  He has followed up with Dr. Theador Hawthorne.   H/o CVA: His weakness of the left UE and LE have improved since the last visit.  He is able to walk short distances in the home with the support.    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 two simple tubular adenomas removed. patient was advised to go to [facility] for colonoscopy but he did not keep appt.   IR CT HEAD LTD  02/02/2021   IR PERCUTANEOUS ART THROMBECTOMY/INFUSION INTRACRANIAL INC DIAG ANGIO  02/02/2021   RADIOLOGY WITH ANESTHESIA N/A 02/02/2021   Procedure: IR WITH ANESTHESIA;  Surgeon: Luanne Bras, MD;  Location: [place];  Service: Radiology;  Laterality: N/A;   TEE WITHOUT CARDIOVERSION N/A 09/10/2020   Procedure: TRANSESOPHAGEAL ECHOCARDIOGRAM (TEE) WITH PROPOFOL;  Surgeon: Satira Sark, MD;  Location: [facility];  Service: Cardiovascular;  Laterality: N/A;    Family History  Problem Relation Age of Onset   COPD Father    Stroke Brother    Pulmonary embolism Sister    Throat cancer Brother    Arthritis Mother    Diabetes Mother    Stroke Mother    Hypertension Brother    Diabetes Brother    Arthritis Brother    Diabetes Sister    Colon cancer Neg Hx     Social History   Socioeconomic History   Marital status: Divorced    Spouse name: Not on file   Number of children: 2   Years of education: 12   Highest education level: Not on file  Occupational History   Occupation: disability    Comment: DVT  Tobacco Use   Smoking status: Former    Packs/day: 1.00    Years:  30.00    Total pack years: 30.00    Types: Cigarettes    Start date: 12/01/1983   Smokeless tobacco: Never  Vaping Use   Vaping Use: Never used  Substance and Sexual Activity   Alcohol use: Not Currently    Alcohol/week: 3.0 standard drinks of alcohol    Types: 3 Cans of beer per week    Comment: has not drank sice 11/27/20   Drug use: Not Currently    Comment: REMOTE HISTORY, Quit in 2007   Sexual activity: Yes  Other Topics Concern   Not on file  Social History Narrative   Lives with sister   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   Right Handed    Drinks caffeine on occassion   Social Determinants of Health   Financial  Resource Strain: Low Risk  (10/01/2021)   Overall Financial Resource Strain (CARDIA)    Difficulty of Paying Living Expenses: Not hard at all  Food Insecurity: No Food Insecurity (10/01/2021)   Hunger Vital Sign    Worried About Running Out of Food in the Last Year: Never true    Ran Out of Food in the Last Year: Never true  Transportation Needs: No Transportation Needs (10/01/2021)   PRAPARE - Hydrologist (Medical): No    Lack of Transportation (Non-Medical): No  Physical Activity: Insufficiently Active (10/01/2021)   Exercise Vital Sign    Days of Exercise per Week: 5 days    Minutes of Exercise per Session: 20 min  Stress: No Stress Concern Present (10/01/2021)   [place]    Feeling of Stress : Only a little  Social Connections: Moderately Isolated (10/01/2021)   Social Connection and Isolation Panel [NHANES]    Frequency of Communication with Friends and Family: More than three times a week    Frequency of Social Gatherings with Friends and Family: More than three times a week    Attends Religious Services: 1 to 4 times per year    Active Member of [employer] or Organizations: No    Attends Archivist Meetings: Never    Marital Status: Divorced  Human resources officer Violence: Not At Risk (10/01/2021)   Humiliation, Afraid, Rape, and Kick questionnaire    Fear of Current or Ex-Partner: No    Emotionally Abused: No    Physically Abused: No    Sexually Abused: No    Outpatient Medications Prior to Visit  Medication Sig Dispense Refill   acetaminophen (TYLENOL) 325 MG tablet Take 2 tablets (650 mg total) by mouth every 4 (four) hours as needed for mild pain (or temp > 37.5 C (99.5 F)).     amLODipine (NORVASC) 5 MG tablet Take 1 tablet by mouth once daily 90 tablet 0   aspirin EC 81 MG EC tablet Take 1 tablet (81 mg total) by mouth daily. Swallow whole. 30 tablet 11   atorvastatin  (LIPITOR) 40 MG tablet Take 1 tablet by mouth once daily 90 tablet 0   cetirizine (ZYRTEC) 10 MG tablet Take 1 tablet (10 mg total) by mouth daily. 30 tablet 11   levETIRAcetam (KEPPRA) 500 MG tablet Take 1 tablet (500 mg total) by mouth 2 (two) times daily. 180 tablet 3   levothyroxine (SYNTHROID) 25 MCG tablet Take 1 tablet by mouth once daily 90 tablet 1   lisinopril (ZESTRIL) 2.5 MG tablet Take 1 tablet (2.5 mg total) by mouth daily. 90 tablet 1  pantoprazole (PROTONIX) 40 MG tablet TAKE 1 TABLET BY MOUTH EVERYDAY AT BEDTIME (Patient taking differently: Take 40 mg by mouth at bedtime.) 90 tablet 1   pantoprazole (PROTONIX) 40 MG tablet Take 1 tablet (40 mg total) by mouth daily. 30 tablet 3   PRADAXA 150 MG CAPS capsule TAKE 1 CAPSULE BY MOUTH EVERY 12 HOURS 60 capsule 5   senna-docusate (SENOKOT-S) 8.6-50 MG tablet Take 1 tablet by mouth at bedtime.     tamsulosin (FLOMAX) 0.4 MG CAPS capsule TAKE 1 CAPSULE BY MOUTH ONCE DAILY AFTER SUPPER 90 capsule 0   No facility-administered medications prior to visit.    No Known Allergies  ROS Review of Systems  Constitutional:  Positive for fatigue. Negative for chills and fever.  HENT:  Negative for congestion, postnasal drip, rhinorrhea and sore throat.   Eyes:  Negative for pain and discharge.  Respiratory:  Negative for cough and shortness of breath.   Cardiovascular:  Negative for chest pain and palpitations.  Gastrointestinal:  Positive for constipation. Negative for diarrhea, nausea and vomiting.  Endocrine: Negative for polydipsia and polyuria.  Genitourinary:  Negative for dysuria and hematuria.  Musculoskeletal:  Positive for arthralgias and neck stiffness. Negative for neck pain.  Skin:  Negative for rash.  Neurological:  Positive for weakness and numbness. Negative for dizziness and headaches.  Psychiatric/Behavioral:  Negative for agitation and behavioral problems.       Objective:    Physical Exam Vitals reviewed.   Constitutional:      General: He is not in acute distress.    Appearance: He is not diaphoretic.     Comments: In wheelchair  HENT:     Head: Normocephalic and atraumatic.     Nose: No congestion.     Mouth/Throat:     Mouth: Mucous membranes are moist.  Eyes:     General: No scleral icterus.    Extraocular Movements: Extraocular movements intact.  Cardiovascular:     Rate and Rhythm: Normal rate and regular rhythm.     Pulses: Normal pulses.     Heart sounds: Normal heart sounds. No murmur heard. Pulmonary:     Breath sounds: Normal breath sounds. No wheezing or rales.  Abdominal:     Palpations: Abdomen is soft.     Tenderness: There is no abdominal tenderness. There is no right CVA tenderness, left CVA tenderness, guarding or rebound.  Musculoskeletal:     Cervical back: Neck supple. No tenderness.     Right lower leg: No edema.     Left lower leg: No edema.  Skin:    General: Skin is warm.     Findings: No rash.  Neurological:     General: No focal deficit present.     Mental Status: He is alert and oriented to person, place, and time.     Sensory: Sensory deficit (Left sided hemiparesis) present.     Motor: Weakness (Left UE and LE 2/5) present.  Psychiatric:        Mood and Affect: Mood normal.        Behavior: Behavior normal.     BP 102/72 (BP Location: Right Arm, Patient Position: Sitting, Cuff Size: Normal)   Resp 16   Ht 6' (1.829 m)   BMI 32.55 kg/m  Wt Readings from Last 3 Encounters:  04/30/22 240 lb (108.9 kg)  04/24/22 240 lb (108.9 kg)  03/26/22 240 lb 6.4 oz (109 kg)    Lab Results  Component Value Date   TSH  3.840 03/25/2022   Lab Results  Component Value Date   WBC 9.5 04/25/2022   HGB 13.2 04/25/2022   HCT 39.5 04/25/2022   MCV 82.1 04/25/2022   PLT 173 04/25/2022   Lab Results  Component Value Date   NA 141 04/30/2022   K 4.6 04/30/2022   CO2 24 04/30/2022   GLUCOSE 42 (L) 04/30/2022   BUN 14 04/30/2022   CREATININE 1.44 (H)  04/30/2022   BILITOT 1.3 (H) 02/17/2022   ALKPHOS 123 02/17/2022   AST 23 02/17/2022   ALT 41 02/17/2022   PROT 7.9 02/17/2022   ALBUMIN 4.1 02/17/2022   CALCIUM 9.1 04/30/2022   ANIONGAP 7 04/25/2022   EGFR 56 (L) 04/30/2022   Lab Results  Component Value Date   CHOL 119 09/24/2021   Lab Results  Component Value Date   HDL 48 09/24/2021   Lab Results  Component Value Date   LDLCALC 57 09/24/2021   Lab Results  Component Value Date   TRIG 63 09/24/2021   Lab Results  Component Value Date   CHOLHDL 2.5 09/24/2021   Lab Results  Component Value Date   HGBA1C 5.9 (H) 02/03/2021      Assessment & Plan:   Problem List Items Addressed This Visit       Cardiovascular and Mediastinum   Essential hypertension    BP Readings from Last 1 Encounters:  07/06/22 102/72  Well-controlled with amlodipine and lisinopril Counseled for compliance with the medications Advised DASH diet      Relevant Orders   CMP14+EGFR     Endocrine   Hypothyroidism    Lab Results  Component Value Date   TSH 3.840 03/25/2022  On Levothyroxine 25 mcg QD Check TSH and free T4      Relevant Orders   CMP14+EGFR   TSH + free T4     Genitourinary   BPH (benign prostatic hyperplasia)    On Flomax      Relevant Orders   PSA   Stage 3a chronic kidney disease ([place])    Followed by nephrology- Dr. Theador Hawthorne On ACEi Avoid nephrotoxic agents      Relevant Orders   CMP14+EGFR   CBC with Differential/Platelet     Other   Prediabetes    Advised to follow low carb diet for now      Relevant Orders   Hemoglobin A1c   CMP14+EGFR   Mixed hyperlipidemia    On statin Check lipid profile      Relevant Orders   Lipid panel   Encounter for general adult medical examination with abnormal findings - Primary    Physical exam as documented. Fasting blood tests ordered.      Relevant Orders   CMP14+EGFR   CBC with Differential/Platelet   Vitamin D deficiency   Relevant Orders    VITAMIN D 25 Hydroxy (Vit-D Deficiency, Fractures)    No orders of the defined types were placed in this encounter.   Follow-up: Return in about 6 months (around 01/06/2023) for HTN and hypothyroidism.    Lindell Spar, MD

## 2022-07-06 NOTE — Assessment & Plan Note (Signed)
On statin Check lipid profile 

## 2022-07-06 NOTE — Assessment & Plan Note (Signed)
Followed by nephrology- Dr. Theador Hawthorne On ACEi Avoid nephrotoxic agents

## 2022-07-28 ENCOUNTER — Encounter (HOSPITAL_COMMUNITY): Payer: Self-pay | Admitting: Physical Therapy

## 2022-07-28 ENCOUNTER — Ambulatory Visit (HOSPITAL_COMMUNITY): Payer: Medicare Other | Attending: Internal Medicine | Admitting: Physical Therapy

## 2022-07-28 DIAGNOSIS — R2689 Other abnormalities of gait and mobility: Secondary | ICD-10-CM | POA: Insufficient documentation

## 2022-07-28 DIAGNOSIS — R262 Difficulty in walking, not elsewhere classified: Secondary | ICD-10-CM | POA: Insufficient documentation

## 2022-07-28 DIAGNOSIS — R29898 Other symptoms and signs involving the musculoskeletal system: Secondary | ICD-10-CM | POA: Insufficient documentation

## 2022-07-28 DIAGNOSIS — I69354 Hemiplegia and hemiparesis following cerebral infarction affecting left non-dominant side: Secondary | ICD-10-CM | POA: Diagnosis not present

## 2022-07-28 DIAGNOSIS — G8110 Spastic hemiplegia affecting unspecified side: Secondary | ICD-10-CM | POA: Insufficient documentation

## 2022-07-28 DIAGNOSIS — M6281 Muscle weakness (generalized): Secondary | ICD-10-CM | POA: Insufficient documentation

## 2022-07-28 NOTE — Therapy (Signed)
OUTPATIENT PHYSICAL THERAPY NEURO EVALUATION   Patient Name: Matthew Wise MRN: [id number] DOB:[birth year], 61 y.o., male Today's Date: 07/28/2022   PCP: Lindell Spar, MD  REFERRING PROVIDER: Lindell Spar, MD    PT End of Session - 07/28/22 1630     Visit Number 1    Number of Visits 16    Date for PT Re-Evaluation 09/22/22    Authorization Type UHC Medicare    Progress Note Due on Visit 10    PT Start Time 1518    PT Stop Time 1605    PT Time Calculation (min) 47 min    Equipment Utilized During Treatment Gait belt    Activity Tolerance Patient tolerated treatment well    Behavior During Therapy Flat affect             Past Medical History:  Diagnosis Date   Acute CVA (cerebrovascular accident) ([place]) 09/09/2020   Chronic anticoagulation 11/14/2014   CKD (chronic kidney disease) stage 3, GFR 30-59 ml/min ([place]) 10/24/2019   CKD (chronic kidney disease) stage 3, GFR 30-59 ml/min ([place]) 10/24/2019   Clotting disorder ([place])    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 two simple tubular adenomas removed. patient was advised to go to [facility] for [facility]oscopy but he did not keep appt.   IR CT HEAD LTD  02/02/2021   IR PERCUTANEOUS ART THROMBECTOMY/INFUSION INTRACRANIAL INC  DIAG ANGIO  02/02/2021   RADIOLOGY WITH ANESTHESIA N/A 02/02/2021   Procedure: IR WITH ANESTHESIA;  Surgeon: Luanne Bras, MD;  Location: [place];  Service: Radiology;  Laterality: N/A;   TEE WITHOUT CARDIOVERSION N/A 09/10/2020   Procedure: TRANSESOPHAGEAL ECHOCARDIOGRAM (TEE) WITH PROPOFOL;  Surgeon: Satira Sark, MD;  Location: [facility];  Service: Cardiovascular;  Laterality: N/A;   Patient Active Problem List   Diagnosis Date Noted   Encounter for general adult medical examination with abnormal findings 07/06/2022   Stage 3a chronic kidney disease ([place]) 07/06/2022   Hospital discharge follow-up 04/30/2022   Obesity, Class I, BMI 30-34.9 04/25/2022   Generalized weakness    Near syncope    Non-seasonal allergic rhinitis 03/25/2022   Encounter for examination following treatment at hospital 02/27/2022   BPH (benign prostatic hyperplasia) 09/24/2021   History of CVA with residual deficit 09/24/2021   Dysphagia, post-stroke    Spastic hemiplegia affecting nondominant side (HCC)    Hemiparesis affecting left side as late effect of stroke ([place])    Thyroid nodule 01/25/2021   Hypothyroidism 01/25/2021   H/O adenomatous polyp of colon 12/30/2020   Essential hypertension 11/28/2020   Mixed hyperlipidemia 11/28/2020   H/O medication noncompliance 09/07/2020   Nicotine dependence 12/05/2019   Prediabetes 10/24/2019   Tubular adenoma of colon 09/14/2017   Long term (current) use of anticoagulants 11/14/2014  Sickle cell trait ([place]) 08/29/2012   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    ONSET DATE: 2022  REFERRING DIAG: I94.854 (ICD-10-CM) - Hemiparesis affecting left side as late effect of stroke (HCC) G81.10 (ICD-10-CM) - Spastic hemiplegia affecting nondominant side (HCC)   THERAPY DIAG:  Difficulty in walking, not elsewhere classified  Other abnormalities of gait and mobility  Rationale for Evaluation and  Treatment Rehabilitation  SUBJECTIVE:                                                                                                                                                                                              SUBJECTIVE STATEMENT: Patient reports CVA in 2021, right hand dominant, has had physical therapy in the past. Notices he gotten weak since last completion of therapy and is reliant on sister for many ADLs. Would like to improve strength and independence with gait and ADLs. Pt accompanied by: self  PERTINENT HISTORY: hx CVA 2020, Left hemiparesis.  PAIN:  Are you having pain? No  PRECAUTIONS: Fall  WEIGHT BEARING RESTRICTIONS No  FALLS: Has patient fallen in last 6 months? No  LIVING ENVIRONMENT: Lives with: lives with their family Lives in: House/apartment Stairs: No Has following equipment at home: Programmer, multimedia, Environmental consultant - 2 wheeled, Wheelchair (manual), and Ramped entry  PLOF: Requires assistive device for independence, Needs assistance with ADLs, and Needs assistance with homemaking  PATIENT GOALS Improve strength, walk better, reduce reliance on sister for ADLs.  OBJECTIVE:     COGNITION: Overall cognitive status: Within functional limits for tasks assessed   SENSATION: Light touch: Impaired  LLE  COORDINATION: Impaired gross and find motor coordination of LLE.      LOWER EXTREMITY MMT:    MMT Right Eval Left Eval  Hip flexion 4- 3-  Hip extension    Hip abduction 4 3+  Hip adduction    Hip internal rotation    Hip external rotation    Knee flexion    Knee extension 4 4-  Ankle dorsiflexion 4+ 2  Ankle plantarflexion    Ankle inversion    Ankle eversion    (Blank rows = not tested)    TRANSFERS: Assistive device utilized: Programmer, multimedia  Sit to stand: Modified independence Stand to sit: Modified independence Chair to chair: Modified independence  RAMP:  Level of Assistance: Modified  independence Assistive device utilized: Quad cane large base Ramp Comments: Reports no assist needed on ramp     GAIT: Gait pattern: step to pattern, decreased arm swing- Left, decreased stance time- Left,  decreased stride length, decreased hip/knee flexion- Left, decreased ankle dorsiflexion- Left, and wide BOS Distance walked: 30 Assistive device utilized: Quad cane large base Level of assistance: Modified independence Comments: Hemiparetic gait, slow, no evidence of buckling, significant drop foot on Lt, lacks heel strike  FUNCTIONAL TESTs:  5 times sit to stand: 21 single hand 2 minute walk test: 30 feet with WBQC    TODAY'S TREATMENT:  Eval HEP   PATIENT EDUCATION: Education details: Findings, POC, HEP Person educated: Patient Education method: Explanation Education comprehension: verbalized understanding   HOME EXERCISE PROGRAM:   Access Code: [email or web address] URL: [email or web address] Date: 07/28/2022 Prepared by: Josue Hector  Exercises - Proper Sit to Stand Technique  - 2 x daily - 7 x weekly - 3 sets - 5 reps - Standing March with Counter Support  - 2 x daily - 7 x weekly - 3 sets - 10 reps    GOALS: Goals reviewed with patient? Yes  SHORT TERM GOALS: Target date: 08/25/2022  Patient will be independent with initial HEP and self-management strategies to improve functional outcomes Baseline: Initiated Goal status: INITIAL   2. Patient will demonstrate ability to ambulate 100 feet at Mod I level with LRAD without seated rest break.   Baseline: 30 feet  Goal Status INITIAL   LONG TERM GOALS: Target date: 09/22/2022  Patient will be independent with advanced HEP and self-management strategies to improve functional outcomes Baseline: Initiated Goal status: INITIAL  2.  Patient will improve 5 time sit to stand <15 seconds to predicted value to indicate improvement in functional outcomes Baseline: 21 seconds with single hand Goal status:  INITIAL   3. Patient will have equal to or > 4+/5 MMT throughout BIL LEs to improve ability to perform functional mobility, stair ambulation and ADLs.  Baseline: See note Goal status: INITIAL  4. Patient will be able to ambulate at least 45 feet during 2MWT with LRAD to demonstrate improved ability to perform functional mobility and associated tasks. Baseline: 30 feet with WBQC Goal status: INITIAL  5. Patient will demonstrate ability to safely navigate 5 steps with single rail and LRAD at Mod I level for meeting transport services in front of his house.  Baseline: Patient has not attempted  Goal Status: INITIAL   ASSESSMENT:  CLINICAL IMPRESSION: Patient is a 61 y.o. male who was seen today for physical therapy evaluation and treatment for left sided hemiparesis following CVA in 2020. Patient denies falls this year but is classified as high fall risk based on objective testing (see above.) Demonstrates gait abnormalities, difficulty walking, and LE weakness BIL, left greater than right. Deficits resulting in impaired functional independence, reliance on family for assistance with ADLs. Patient will benefit from skilled PT intervention to address deficits listed above, improve safety, reduce fall risk, and independence with ADLs.   OBJECTIVE IMPAIRMENTS Abnormal gait, decreased activity tolerance, decreased balance, decreased coordination, decreased endurance, decreased knowledge of use of DME, decreased mobility, difficulty walking, decreased ROM, decreased strength, decreased safety awareness, and impaired sensation.   ACTIVITY LIMITATIONS carrying, lifting, stairs, transfers, bed mobility, and bathing  PARTICIPATION LIMITATIONS: meal prep, cleaning, laundry, and community activity  PERSONAL FACTORS Time since onset of injury/illness/exacerbation are also affecting patient's functional outcome.   REHAB POTENTIAL: Fair Chronic hemiparesis  CLINICAL DECISION MAKING:  Stable/uncomplicated  EVALUATION COMPLEXITY: Low  PLAN: PT FREQUENCY: 2x/week  PT DURATION: 8 weeks  PLANNED INTERVENTIONS: Therapeutic exercises, Therapeutic activity, Neuromuscular re-education, Balance training, Gait training, Patient/Family education, Self Care, Joint mobilization, Stair  training, and Wheelchair mobility training  PLAN FOR NEXT SESSION: Asked to bring AFO for trial. Gait training with [facility] vs Hemi Walker. Gross strengthening. Progress with stair training as able.  4:51 PM, 07/28/22 Josue Hector PT DPT  Physical Therapist with [facility]  [phone]

## 2022-07-30 ENCOUNTER — Ambulatory Visit (HOSPITAL_COMMUNITY): Payer: Medicare Other | Admitting: Physical Therapy

## 2022-07-30 DIAGNOSIS — R2689 Other abnormalities of gait and mobility: Secondary | ICD-10-CM

## 2022-07-30 DIAGNOSIS — R29898 Other symptoms and signs involving the musculoskeletal system: Secondary | ICD-10-CM

## 2022-07-30 DIAGNOSIS — M6281 Muscle weakness (generalized): Secondary | ICD-10-CM | POA: Diagnosis not present

## 2022-07-30 DIAGNOSIS — R262 Difficulty in walking, not elsewhere classified: Secondary | ICD-10-CM

## 2022-07-30 DIAGNOSIS — I69354 Hemiplegia and hemiparesis following cerebral infarction affecting left non-dominant side: Secondary | ICD-10-CM | POA: Diagnosis not present

## 2022-07-30 DIAGNOSIS — G8110 Spastic hemiplegia affecting unspecified side: Secondary | ICD-10-CM | POA: Diagnosis not present

## 2022-07-30 NOTE — Therapy (Signed)
OUTPATIENT PHYSICAL THERAPY NEURO Treatment   Patient Name: Matthew Wise MRN: [id number] DOB:[birth year], 61 y.o., male Today's Date: 07/30/2022   PCP: Lindell Spar, MD  REFERRING PROVIDER: Lindell Spar, MD    PT End of Session - 07/30/22 1348     Visit Number 2    Number of Visits 16    Date for PT Re-Evaluation 09/22/22    Authorization Type [employer] Medicare    Progress Note Due on Visit 10    PT Start Time 1305    PT Stop Time 1343    PT Time Calculation (min) 38 min    Equipment Utilized During Treatment Gait belt    Activity Tolerance Patient tolerated treatment well    Behavior During Therapy Flat affect              Past Medical History:  Diagnosis Date   Acute CVA (cerebrovascular accident) ([place]) 09/09/2020   Chronic anticoagulation 11/14/2014   CKD (chronic kidney disease) stage 3, GFR 30-59 ml/min ([place]) 10/24/2019   CKD (chronic kidney disease) stage 3, GFR 30-59 ml/min ([place]) 10/24/2019   Clotting disorder ([place])    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 two simple tubular adenomas removed. patient was advised to go to [facility] for colonoscopy but he did not keep appt.   IR CT HEAD LTD  02/02/2021   IR PERCUTANEOUS ART THROMBECTOMY/INFUSION INTRACRANIAL INC  DIAG ANGIO  02/02/2021   RADIOLOGY WITH ANESTHESIA N/A 02/02/2021   Procedure: IR WITH ANESTHESIA;  Surgeon: Luanne Bras, MD;  Location: [place];  Service: Radiology;  Laterality: N/A;   TEE WITHOUT CARDIOVERSION N/A 09/10/2020   Procedure: TRANSESOPHAGEAL ECHOCARDIOGRAM (TEE) WITH PROPOFOL;  Surgeon: Satira Sark, MD;  Location: [facility];  Service: Cardiovascular;  Laterality: N/A;   Patient Active Problem List   Diagnosis Date Noted   Encounter for general adult medical examination with abnormal findings 07/06/2022   Stage 3a chronic kidney disease ([place]) 07/06/2022   Hospital discharge follow-up 04/30/2022   Obesity, Class I, BMI 30-34.9 04/25/2022   Generalized weakness    Near syncope    Non-seasonal allergic rhinitis 03/25/2022   Encounter for examination following treatment at hospital 02/27/2022   BPH (benign prostatic hyperplasia) 09/24/2021   History of CVA with residual deficit 09/24/2021   Dysphagia, post-stroke    Spastic hemiplegia affecting nondominant side (HCC)    Hemiparesis affecting left side as late effect of stroke ([place])    Thyroid nodule 01/25/2021   Hypothyroidism 01/25/2021   H/O adenomatous polyp of colon 12/30/2020   Essential hypertension 11/28/2020   Mixed hyperlipidemia 11/28/2020   H/O medication noncompliance 09/07/2020   Nicotine dependence 12/05/2019   Prediabetes 10/24/2019   Tubular adenoma of colon 09/14/2017   Long term (current) use of anticoagulants 11/14/2014  Sickle cell trait ([place]) 08/29/2012   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    ONSET DATE: 2022  REFERRING DIAG: B51.025 (ICD-10-CM) - Hemiparesis affecting left side as late effect of stroke (HCC) G81.10 (ICD-10-CM) - Spastic hemiplegia affecting nondominant side (HCC)   THERAPY DIAG:  Difficulty in walking, not elsewhere classified  Other abnormalities of gait and mobility  Other symptoms and signs  involving the musculoskeletal system  Muscle weakness (generalized)  Rationale for Evaluation and Treatment Rehabilitation  SUBJECTIVE:                                                                                                                                   SUBJECTIVE STATEMENT: Pt states that he walks down the hall in his home about 3-4 x a day and walks down and up his ramp for exercise.  The hardest thing at home for him to do is to get off the couch and on and off the commode.   Pt accompanied by: self  PERTINENT HISTORY: hx CVA 2020, Left hemiparesis.  PAIN:  Are you having pain? No  PRECAUTIONS: Fall  WEIGHT BEARING RESTRICTIONS No  FALLS: Has patient fallen in last 6 months? No  LIVING ENVIRONMENT: Lives with: lives with their family Lives in: House/apartment Stairs: No Has following equipment at home: Programmer, multimedia, Environmental consultant - 2 wheeled, Wheelchair (manual), and Ramped entry  PLOF: Requires assistive device for independence, Needs assistance with ADLs, and Needs assistance with homemaking  PATIENT GOALS Improve strength, walk better, reduce reliance on sister for ADLs.  OBJECTIVE:     COGNITION: Overall cognitive status: Within functional limits for tasks assessed   SENSATION: Light touch: Impaired  LLE  COORDINATION: Impaired gross and find motor coordination of LLE.      LOWER EXTREMITY MMT:  8/31- tested against gravity supine, sidelying and prone   MMT Right Eval 07/30/22 Left Eval 07/30/22  Hip flexion 4- 2+ 3- 2  Hip extension  3-  2  Hip abduction 4 3 3+ 2  Hip adduction      Hip internal rotation      Hip external rotation      Knee flexion  3+  1  Knee extension 4  4-   Ankle dorsiflexion 4+  2   Ankle plantarflexion      Ankle inversion      Ankle eversion      (Blank rows = not tested)    TRANSFERS: Assistive device utilized: Programmer, multimedia  Sit to stand: Modified independence Stand to sit: Modified  independence Chair to chair: Modified independence  RAMP:  Level of Assistance: Modified independence Assistive device utilized: Quad cane large base Ramp Comments: Reports no assist needed on ramp     GAIT: Gait pattern: step to pattern, decreased arm swing- Left, decreased stance time- Left, decreased stride length, decreased hip/knee flexion-  Left, decreased ankle dorsiflexion- Left, and wide BOS Distance walked: 30 Assistive device utilized: Quad cane large base Level of assistance: Modified independence Comments: Hemiparetic gait, slow, no evidence of buckling, significant drop foot on Lt, lacks heel strike  FUNCTIONAL TESTs:  5 times sit to stand: 21 single hand 2 minute walk test: 30 feet with WBQC    TODAY'S TREATMENT:  8/31: Sit to stand x 10          Side stepping x 3 RT          Supine: bridge x 10                        Lt hip abduction x 10           Lt side lying: Rt hip abduction x 10          Prone:  Rt hip extension x 5   Eval HEP   PATIENT EDUCATION: Education details: Findings, POC, HEP Person educated: Patient Education method: Explanation Education comprehension: verbalized understanding   HOME EXERCISE PROGRAM:   Access Code: [email or web address] URL: [email or web address] Date: 07/28/2022 Prepared by: Josue Hector  Exercises - Proper Sit to Stand Technique  - 2 x daily - 7 x weekly - 3 sets - 5 reps - Standing March with Counter Support  - 2 x daily - 7 x weekly - 3 sets - 10 reps    GOALS: Goals reviewed with patient? Yes  SHORT TERM GOALS: Target date: 08/25/2022  Patient will be independent with initial HEP and self-management strategies to improve functional outcomes Baseline: Initiated Goal status: IN PROGRESS   2. Patient will demonstrate ability to ambulate 100 feet at Mod I level with LRAD without seated rest break.   Baseline: 30 feet  Goal Status In Progress   LONG TERM GOALS: Target date: 09/22/2022  Patient  will be independent with advanced HEP and self-management strategies to improve functional outcomes Baseline: Initiated Goal status: IN PROGRESS  2.  Patient will improve 5 time sit to stand <15 seconds to predicted value to indicate improvement in functional outcomes Baseline: 21 seconds with single hand Goal status: IN PROGRESS   3. Patient will have equal to or > 4+/5 MMT throughout BIL LEs to improve ability to perform functional mobility, stair ambulation and ADLs.  Baseline: See note Goal status: IN PROGRESS  4. Patient will be able to ambulate at least 45 feet during 2MWT with LRAD to demonstrate improved ability to perform functional mobility and associated tasks. Baseline: 30 feet with WBQC Goal status: IN PROGRESS  5. Patient will demonstrate ability to safely navigate 5 steps with single rail and LRAD at Mod I level for meeting transport services in front of his house.  Baseline: Patient has not attempted  Goal Status:in Progress    ASSESSMENT:  CLINICAL IMPRESSION:  Began functional strengthening program.  Reviewed evaluation and goals.  Therapist manual mm tested hip mm for proper baseline. PT is limited by increased tone, decreased strength, decreased activity tolerance and will benefit from skilled PT to address these problems and improve his functional mobility.    OBJECTIVE IMPAIRMENTS Abnormal gait, decreased activity tolerance, decreased balance, decreased coordination, decreased endurance, decreased knowledge of use of DME, decreased mobility, difficulty walking, decreased ROM, decreased strength, decreased safety awareness, and impaired sensation.   ACTIVITY LIMITATIONS carrying, lifting, stairs, transfers, bed mobility, and bathing  PARTICIPATION LIMITATIONS: meal prep, cleaning, laundry, and community activity  PERSONAL FACTORS Time since  onset of injury/illness/exacerbation are also affecting patient's functional outcome.   REHAB POTENTIAL: Fair Chronic  hemiparesis  CLINICAL DECISION MAKING: Stable/uncomplicated  EVALUATION COMPLEXITY: Low  PLAN: PT FREQUENCY: 2x/week  PT DURATION: 8 weeks  PLANNED INTERVENTIONS: Therapeutic exercises, Therapeutic activity, Neuromuscular re-education, Balance training, Gait training, Patient/Family education, Self Care, Joint mobilization, Stair training, and Wheelchair mobility training  PLAN FOR NEXT SESSION:  update HEP, work on sit to stand I from lower heights as pt is having difficulty with this at home.   Gait training with WBQC vs Hemi Walker. Gross strengthening. Progress with stair training as able.  1:46 PM, 07/30/22 [name], [place] CLT [phone]

## 2022-08-05 DIAGNOSIS — R809 Proteinuria, unspecified: Secondary | ICD-10-CM | POA: Diagnosis not present

## 2022-08-05 DIAGNOSIS — N1831 Chronic kidney disease, stage 3a: Secondary | ICD-10-CM | POA: Diagnosis not present

## 2022-08-05 DIAGNOSIS — I129 Hypertensive chronic kidney disease with stage 1 through stage 4 chronic kidney disease, or unspecified chronic kidney disease: Secondary | ICD-10-CM | POA: Diagnosis not present

## 2022-08-13 ENCOUNTER — Ambulatory Visit (HOSPITAL_COMMUNITY): Payer: Medicare Other | Attending: Internal Medicine

## 2022-08-13 ENCOUNTER — Telehealth (HOSPITAL_COMMUNITY): Payer: Self-pay

## 2022-08-13 DIAGNOSIS — R262 Difficulty in walking, not elsewhere classified: Secondary | ICD-10-CM | POA: Insufficient documentation

## 2022-08-13 DIAGNOSIS — R2689 Other abnormalities of gait and mobility: Secondary | ICD-10-CM | POA: Insufficient documentation

## 2022-08-13 DIAGNOSIS — R278 Other lack of coordination: Secondary | ICD-10-CM | POA: Insufficient documentation

## 2022-08-13 DIAGNOSIS — M6281 Muscle weakness (generalized): Secondary | ICD-10-CM | POA: Insufficient documentation

## 2022-08-13 DIAGNOSIS — R29898 Other symptoms and signs involving the musculoskeletal system: Secondary | ICD-10-CM | POA: Insufficient documentation

## 2022-08-13 NOTE — Telephone Encounter (Signed)
No show, called with no answer and mailbox full so unable to leave message concerning missed apt today.    Ihor Austin, LPTA/CLT; Delana Meyer [phone]

## 2022-08-14 ENCOUNTER — Other Ambulatory Visit: Payer: Self-pay

## 2022-08-14 MED ORDER — PANTOPRAZOLE SODIUM 40 MG PO TBEC: 40.0000 mg | DELAYED_RELEASE_TABLET | Freq: Every day | ORAL | 3 refills | Status: DC

## 2022-08-18 ENCOUNTER — Encounter (HOSPITAL_COMMUNITY): Payer: Self-pay

## 2022-08-18 ENCOUNTER — Ambulatory Visit (HOSPITAL_COMMUNITY): Payer: Medicare Other

## 2022-08-18 DIAGNOSIS — R29898 Other symptoms and signs involving the musculoskeletal system: Secondary | ICD-10-CM | POA: Diagnosis not present

## 2022-08-18 DIAGNOSIS — R262 Difficulty in walking, not elsewhere classified: Secondary | ICD-10-CM

## 2022-08-18 DIAGNOSIS — M6281 Muscle weakness (generalized): Secondary | ICD-10-CM | POA: Diagnosis not present

## 2022-08-18 DIAGNOSIS — R2689 Other abnormalities of gait and mobility: Secondary | ICD-10-CM | POA: Diagnosis not present

## 2022-08-18 DIAGNOSIS — R278 Other lack of coordination: Secondary | ICD-10-CM | POA: Diagnosis not present

## 2022-08-18 NOTE — Therapy (Signed)
OUTPATIENT PHYSICAL THERAPY NEURO Treatment   Patient Name: Matthew Wise MRN: [id number] DOB:[birth year], 61 y.o., male Today's Date: 08/18/2022   PCP: Lindell Spar, MD  REFERRING PROVIDER: Lindell Spar, MD    PT End of Session - 08/18/22 1717     Visit Number 3    Number of Visits 16    Date for PT Re-Evaluation 09/22/22    Authorization Type [employer]HC Medicare    Progress Note Due on Visit 10    PT Start Time 1610    PT Stop Time 1656    PT Time Calculation (min) 46 min    Equipment Utilized During Treatment Gait belt    Activity Tolerance Patient tolerated treatment well    Behavior During Therapy Flat affect               Past Medical History:  Diagnosis Date   Acute CVA (cerebrovascular accident) ([place]) 09/09/2020   Chronic anticoagulation 11/14/2014   CKD (chronic kidney disease) stage 3, GFR 30-59 ml/min ([place]) 10/24/2019   CKD (chronic kidney disease) stage 3, GFR 30-59 ml/min ([place]) 10/24/2019   Clotting disorder ([place])    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 two simple tubular adenomas removed. patient was advised to go to [facility] for colonoscopy but he did not keep appt.   IR CT HEAD LTD  02/02/2021   IR PERCUTANEOUS ART THROMBECTOMY/INFUSION INTRACRANIAL INC  DIAG ANGIO  02/02/2021   RADIOLOGY WITH ANESTHESIA N/A 02/02/2021   Procedure: IR WITH ANESTHESIA;  Surgeon: Luanne Bras, MD;  Location: [place];  Service: Radiology;  Laterality: N/A;   TEE WITHOUT CARDIOVERSION N/A 09/10/2020   Procedure: TRANSESOPHAGEAL ECHOCARDIOGRAM (TEE) WITH PROPOFOL;  Surgeon: Satira Sark, MD;  Location: [facility];  Service: Cardiovascular;  Laterality: N/A;   Patient Active Problem List   Diagnosis Date Noted   Encounter for general adult medical examination with abnormal findings 07/06/2022   Stage 3a chronic kidney disease ([place]) 07/06/2022   Hospital discharge follow-up 04/30/2022   Obesity, Class I, BMI 30-34.9 04/25/2022   Generalized weakness    Near syncope    Non-seasonal allergic rhinitis 03/25/2022   Encounter for examination following treatment at hospital 02/27/2022   BPH (benign prostatic hyperplasia) 09/24/2021   History of CVA with residual deficit 09/24/2021   Dysphagia, post-stroke    Spastic hemiplegia affecting nondominant side (HCC)    Hemiparesis affecting left side as late effect of stroke ([place])    Thyroid nodule 01/25/2021   Hypothyroidism 01/25/2021   H/O adenomatous polyp of colon 12/30/2020   Essential hypertension 11/28/2020   Mixed hyperlipidemia 11/28/2020   H/O medication noncompliance 09/07/2020   Nicotine dependence 12/05/2019   Prediabetes 10/24/2019   Tubular adenoma of colon 09/14/2017   Long term (current) use of anticoagulants 11/14/2014  Sickle cell trait ([place]) 08/29/2012   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    ONSET DATE: 2022  REFERRING DIAG: M08.676 (ICD-10-CM) - 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  Reports he walks everyday with cane, more difficult on carpet.  Reports some difficulty with standing from low and soft surfaces.    Pt states that he walks down the hall in his home about 3-4 x a day and walks down and up his ramp for exercise.  The hardest thing at home for him to do is to get off the couch and on and off the commode.   Pt accompanied by: self  PERTINENT HISTORY: hx CVA 2020, Left hemiparesis.  PAIN:  Are you having pain? No  PRECAUTIONS: Fall  WEIGHT BEARING RESTRICTIONS No  FALLS: Has patient fallen in last 6 months? No  LIVING ENVIRONMENT: Lives with: lives with their family Lives in: House/apartment Stairs: No Has following equipment at home: Programmer, multimedia, Environmental consultant - 2 wheeled, Wheelchair (manual), and Ramped entry  PLOF: Requires assistive device for independence, Needs assistance with ADLs, and Needs assistance with homemaking  PATIENT GOALS Improve strength, walk better, reduce reliance on sister for ADLs.  OBJECTIVE:     COGNITION: Overall cognitive status: Within functional limits for tasks assessed   SENSATION: Light touch: Impaired  LLE  COORDINATION: Impaired gross and find motor coordination of LLE.      LOWER EXTREMITY MMT:  8/31- tested against gravity supine, sidelying and prone   MMT Right Eval 07/30/22 Left Eval 07/30/22  Hip flexion 4- 2+ 3- 2  Hip extension  3-  2  Hip abduction 4 3 3+ 2  Hip adduction      Hip internal rotation      Hip external rotation      Knee flexion  3+  1  Knee extension 4  4-   Ankle dorsiflexion 4+  2   Ankle plantarflexion      Ankle  inversion      Ankle eversion      (Blank rows = not tested)    TRANSFERS: Assistive device utilized: Quad cane large base  Sit to stand: Modified independence Stand to sit: Modified independence Chair to chair: Modified independence  RAMP:  Level of Assistance: Modified independence Assistive  device utilized: Programmer, multimedia Ramp Comments: Reports no assist needed on ramp     GAIT: Gait pattern: step to pattern, decreased arm swing- Left, decreased stance time- Left, decreased stride length, decreased hip/knee flexion- Left, decreased ankle dorsiflexion- Left, and wide BOS Distance walked: 30 Assistive device utilized: Quad cane large base Level of assistance: Modified independence Comments: Hemiparetic gait, slow, no evidence of buckling, significant drop foot on Lt, lacks heel strike  FUNCTIONAL TESTs:  5 times sit to stand: 21 single hand 2 minute walk test: 30 feet with WBQC    TODAY'S TREATMENT:  08/18/22: WC to mat transfer with [facility] Prone:Rt hip extension 5 reps 3" holds  Knee flexion, massager used on Lt hamstrings Sidelying: Rt abd 2sets 10reps 3" holds Supine: Adduction Lt with ball squeeze 10 x5"  Bridge 2x 10 3"  LT hip abduction Sit to stand 10x hands on thigh Sidestep front of mat 3RT Weight shifting   8/31: Sit to stand x 10          Side stepping x 3 RT          Supine: bridge x 10                        Lt hip abduction x 10           Lt side lying: Rt hip abduction x 10          Prone:  Rt hip extension x 5   Eval HEP   PATIENT EDUCATION: Education details: Findings, POC, HEP Person educated: Patient Education method: Explanation Education comprehension: verbalized understanding   HOME EXERCISE PROGRAM:   Access Code: [email or web address] URL: [email or web address] Date: 07/28/2022 Prepared by: Josue Hector  Exercises - Proper Sit to Stand Technique  - 2 x daily - 7 x weekly - 3 sets - 5 reps - Standing March with  Counter Support  - 2 x daily - 7 x weekly - 3 sets - 10 reps   08/18/22: Bridge with belt to reduce ER   GOALS: Goals reviewed with patient? Yes  SHORT TERM GOALS: Target date: 08/25/2022  Patient will be independent with initial HEP and self-management strategies to improve functional outcomes Baseline: Initiated Goal status: IN PROGRESS   2. Patient will demonstrate ability to ambulate 100 feet at Mod I level with LRAD without seated rest break.   Baseline: 30 feet  Goal Status In Progress   LONG TERM GOALS: Target date: 09/22/2022  Patient will be independent with advanced HEP and self-management strategies to improve functional outcomes Baseline: Initiated Goal status: IN PROGRESS  2.  Patient will improve 5 time sit to stand <15 seconds to predicted value to indicate improvement in functional outcomes Baseline: 21 seconds with single hand Goal status: IN PROGRESS   3. Patient will have equal to or > 4+/5 MMT throughout BIL LEs to improve ability to perform functional mobility, stair ambulation and ADLs.  Baseline: See note Goal status: IN PROGRESS  4. Patient will be able to ambulate at least 45 feet during 2MWT with LRAD to demonstrate improved ability to perform functional mobility and associated tasks. Baseline: 30 feet with WBQC Goal status: IN PROGRESS  5. Patient will demonstrate ability to safely navigate 5 steps with single rail and LRAD at Mod I level for meeting transport services in front of his house.  Baseline: Patient has not attempted  Goal Status:in Progress    ASSESSMENT:  CLINICAL IMPRESSION:  Session focus on strengthening LE.  Trial with massage gun for Lt hamstring activation with minimal changes.  Pt able to complete sit to stands from low mat height, pt relies on Rt LE for weight bearing.  Increased difficulty with Lt LE weight bearing.  Pt will need to improve Lt LE stance to progress in gait.  Added bridges to HEP with printout and folder  given to pt.     OBJECTIVE IMPAIRMENTS Abnormal gait, decreased activity tolerance, decreased balance, decreased coordination, decreased endurance, decreased knowledge of use of DME, decreased mobility, difficulty walking, decreased ROM, decreased strength, decreased safety awareness, and impaired sensation.   ACTIVITY LIMITATIONS carrying, lifting, stairs, transfers, bed mobility, and bathing  PARTICIPATION LIMITATIONS: meal prep, cleaning, laundry, and community activity  PERSONAL FACTORS Time since onset of injury/illness/exacerbation are also affecting patient's functional outcome.   REHAB POTENTIAL: Fair Chronic hemiparesis  CLINICAL DECISION MAKING: Stable/uncomplicated  EVALUATION COMPLEXITY: Low  PLAN: PT FREQUENCY: 2x/week  PT DURATION: 8 weeks  PLANNED INTERVENTIONS: Therapeutic exercises, Therapeutic activity, Neuromuscular re-education, Balance training, Gait training, Patient/Family education, Self Care, Joint mobilization, Stair training, and Wheelchair mobility training  PLAN FOR NEXT SESSION:  update HEP, work on sit to stand I from lower heights as pt is having difficulty with this at home.   Gait training with WBQC vs Hemi Walker. Gross strengthening. Progress with stair training as able.  Ihor Austin, LPTA/CLT; Delana Meyer [phone]  08/18/22

## 2022-08-19 ENCOUNTER — Ambulatory Visit (HOSPITAL_COMMUNITY): Payer: Medicare Other | Admitting: Physical Therapy

## 2022-08-19 DIAGNOSIS — R2689 Other abnormalities of gait and mobility: Secondary | ICD-10-CM | POA: Diagnosis not present

## 2022-08-19 DIAGNOSIS — R278 Other lack of coordination: Secondary | ICD-10-CM | POA: Diagnosis not present

## 2022-08-19 DIAGNOSIS — M6281 Muscle weakness (generalized): Secondary | ICD-10-CM

## 2022-08-19 DIAGNOSIS — R262 Difficulty in walking, not elsewhere classified: Secondary | ICD-10-CM | POA: Diagnosis not present

## 2022-08-19 DIAGNOSIS — R29898 Other symptoms and signs involving the musculoskeletal system: Secondary | ICD-10-CM | POA: Diagnosis not present

## 2022-08-19 NOTE — Therapy (Signed)
OUTPATIENT PHYSICAL THERAPY NEURO Treatment   Patient Name: Matthew Wise MRN: [id number] DOB:[birth year], 61 y.o., male Today's Date: 08/19/2022   PCP: Lindell Spar, MD  REFERRING PROVIDER: Lindell Spar, MD    PT End of Session - 08/19/22 1559     Visit Number 4    Number of Visits 16    Date for PT Re-Evaluation 09/22/22    Authorization Type [employer] Medicare    Progress Note Due on Visit 10    PT Start Time 1559    PT Stop Time 1645    PT Time Calculation (min) 46 min    Equipment Utilized During Treatment Gait belt    Activity Tolerance Patient tolerated treatment well;Patient limited by fatigue    Behavior During Therapy Flat affect               Past Medical History:  Diagnosis Date   Acute CVA (cerebrovascular accident) ([place]) 09/09/2020   Chronic anticoagulation 11/14/2014   CKD (chronic kidney disease) stage 3, GFR 30-59 ml/min ([place]) 10/24/2019   CKD (chronic kidney disease) stage 3, GFR 30-59 ml/min ([place]) 10/24/2019   Clotting disorder ([place])    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 two simple tubular adenomas removed. patient was advised to go to [facility] for colonoscopy but he did not keep appt.   IR CT HEAD LTD  02/02/2021   IR PERCUTANEOUS ART  THROMBECTOMY/INFUSION INTRACRANIAL INC DIAG ANGIO  02/02/2021   RADIOLOGY WITH ANESTHESIA N/A 02/02/2021   Procedure: IR WITH ANESTHESIA;  Surgeon: Luanne Bras, MD;  Location: [place];  Service: Radiology;  Laterality: N/A;   TEE WITHOUT CARDIOVERSION N/A 09/10/2020   Procedure: TRANSESOPHAGEAL ECHOCARDIOGRAM (TEE) WITH PROPOFOL;  Surgeon: Satira Sark, MD;  Location: [facility];  Service: Cardiovascular;  Laterality: N/A;   Patient Active Problem List   Diagnosis Date Noted   Encounter for general adult medical examination with abnormal findings 07/06/2022   Stage 3a chronic kidney disease ([place]) 07/06/2022   Hospital discharge follow-up 04/30/2022   Obesity, Class I, BMI 30-34.9 04/25/2022   Generalized weakness    Near syncope    Non-seasonal allergic rhinitis 03/25/2022   Encounter for examination following treatment at hospital 02/27/2022   BPH (benign prostatic hyperplasia) 09/24/2021   History of CVA with residual deficit 09/24/2021   Dysphagia, post-stroke    Spastic hemiplegia affecting nondominant side (HCC)    Hemiparesis affecting left side as late effect of stroke ([place])    Thyroid nodule 01/25/2021   Hypothyroidism 01/25/2021   H/O adenomatous polyp of colon 12/30/2020   Essential hypertension 11/28/2020   Mixed hyperlipidemia 11/28/2020   H/O medication noncompliance 09/07/2020   Nicotine dependence 12/05/2019   Prediabetes 10/24/2019   Tubular adenoma of colon 09/14/2017   Long term (current) use  of anticoagulants 11/14/2014   Sickle cell trait ([place]) 08/29/2012   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 No pain currently. No adverse effects from yesterday.  Pt accompanied by: self  PERTINENT HISTORY: hx CVA 2020, Left hemiparesis.  PAIN:  Are you having pain? No  PRECAUTIONS: Fall  WEIGHT BEARING RESTRICTIONS No  FALLS: Has patient fallen in last 6 months? No  LIVING ENVIRONMENT: Lives with: lives with their family Lives in: House/apartment Stairs: No Has following equipment at home: Programmer, multimedia, Environmental consultant - 2 wheeled, Wheelchair (manual), and Ramped entry  PLOF: Requires assistive device for independence, Needs assistance with ADLs, and Needs assistance with homemaking  PATIENT GOALS Improve strength, walk better, reduce reliance on sister for ADLs.  OBJECTIVE:   COGNITION: Overall cognitive status: Within functional limits for tasks assessed   SENSATION: Light touch: Impaired  LLE  COORDINATION: Impaired gross and find motor coordination of LLE.      LOWER EXTREMITY MMT:  8/31- tested against gravity supine, sidelying and prone   MMT Right Eval 07/30/22 Left Eval 07/30/22  Hip flexion 4- 2+ 3- 2  Hip extension  3-  2  Hip abduction 4 3 3+ 2  Hip adduction      Hip internal rotation      Hip external rotation      Knee flexion  3+  1  Knee extension 4  4-   Ankle dorsiflexion 4+  2   Ankle plantarflexion      Ankle inversion      Ankle eversion      (Blank rows = not tested)    TRANSFERS: Assistive device utilized: Programmer, multimedia  Sit to stand: Modified independence Stand to sit: Modified independence Chair to chair: Modified independence  RAMP:  Level of Assistance: Modified independence Assistive device utilized: Quad cane large base Ramp Comments:  Reports no assist needed on ramp     GAIT: Gait pattern: step to pattern, decreased arm swing- Left, decreased stance time- Left, decreased stride length, decreased hip/knee flexion- Left, decreased ankle dorsiflexion- Left, and wide BOS Distance walked: 30 Assistive device utilized: Quad cane large base Level of assistance: Modified independence Comments: Hemiparetic gait, slow, no evidence of buckling, significant drop foot on Lt, lacks heel strike  FUNCTIONAL TESTs:  5 times sit to stand: 21 single hand 2 minute walk test: 30 feet with [facility]    TODAY'S TREATMENT:  08/19/22 Sit to stand x 10  Seated hip adduction 10 x 5" Seated Hamstring curl BTB 10 x 5"  Standing LLE heel tap 4 inch 2 x 10 with rest break x 1 Sidestepping in parallel bars 3RT     08/18/22: WC to mat transfer with [facility] Prone:Rt hip extension 5 reps 3" holds  Knee flexion, massager used on Lt hamstrings Sidelying: Rt abd 2sets 10reps 3" holds Supine: Adduction Lt with ball squeeze 10 x5"  Bridge 2x 10 3"  LT hip abduction Sit to stand 10x hands on thigh Sidestep front of mat 3RT Weight shifting   8/31: Sit to stand x 10          Side stepping x 3 RT          Supine: bridge x 10                        Lt hip abduction x 10           Lt side lying: Rt hip abduction x 10          Prone:  Rt hip extension x 5   Eval HEP   PATIENT EDUCATION: Education details: Findings, POC, HEP Person educated: Patient Education method: Explanation Education comprehension: verbalized understanding   HOME EXERCISE PROGRAM:   Access Code: [email or web address] URL: [email or web address] Date: 07/28/2022 Prepared by: Josue Hector  Exercises - Proper Sit to Stand Technique  - 2 x daily - 7 x weekly - 3 sets - 5 reps - Standing March with Counter Support  - 2 x daily - 7 x weekly - 3 sets - 10 reps   08/18/22: Bridge with belt to reduce ER   GOALS: Goals reviewed with patient? Yes  SHORT TERM GOALS:  Target date: 08/25/2022  Patient will be independent with initial HEP and self-management strategies to improve functional outcomes Baseline: Initiated Goal status: IN PROGRESS   2. Patient will demonstrate ability to ambulate 100 feet at Mod I level with LRAD without seated rest break.   Baseline: 30 feet  Goal Status In Progress   LONG TERM GOALS: Target date: 09/22/2022  Patient will be independent with advanced HEP and self-management strategies to improve functional outcomes Baseline: Initiated Goal status: IN PROGRESS  2.  Patient will improve 5 time sit to stand <15 seconds to predicted value to indicate improvement in functional outcomes Baseline: 21 seconds with single hand Goal status: IN PROGRESS   3. Patient will have equal to or > 4+/5 MMT throughout BIL LEs to improve ability to perform functional mobility, stair ambulation and ADLs.  Baseline: See note Goal status: IN PROGRESS  4. Patient will be able to ambulate at least 45 feet during 2MWT with LRAD to demonstrate improved ability to perform functional mobility and associated tasks. Baseline: 30 feet with WBQC Goal status: IN PROGRESS  5. Patient will demonstrate ability to safely navigate 5 steps with single rail and LRAD at Mod I level for meeting transport services in front of his house.  Baseline: Patient has not attempted  Goal Status:in Progress    ASSESSMENT:  CLINICAL IMPRESSION:   Patient continues to struggle with LLE weakness. Requires increased verbal cues for ground clearance when doing step taps. Limited by fatigue with standing activities. Ongoing difficulty with transfers, requiring heavy use of RUE. Patient will continue  to benefit from skilled therapy services to reduce remaining deficits and improve functional ability.   OBJECTIVE IMPAIRMENTS Abnormal gait, decreased activity tolerance, decreased balance, decreased coordination, decreased endurance, decreased knowledge of use of DME, decreased  mobility, difficulty walking, decreased ROM, decreased strength, decreased safety awareness, and impaired sensation.   ACTIVITY LIMITATIONS carrying, lifting, stairs, transfers, bed mobility, and bathing  PARTICIPATION LIMITATIONS: meal prep, cleaning, laundry, and community activity  PERSONAL FACTORS Time since onset of injury/illness/exacerbation are also affecting patient's functional outcome.   REHAB POTENTIAL: Fair Chronic hemiparesis  CLINICAL DECISION MAKING: Stable/uncomplicated  EVALUATION COMPLEXITY: Low  PLAN: PT FREQUENCY: 2x/week  PT DURATION: 8 weeks  PLANNED INTERVENTIONS: Therapeutic exercises, Therapeutic activity, Neuromuscular re-education, Balance training, Gait training, Patient/Family education, Self Care, Joint mobilization, Stair training, and Wheelchair mobility training  PLAN FOR NEXT SESSION:  work on sit to stand I from lower heights as pt is having difficulty with this at home. Gait training with WBQC vs Hemi Walker. Gross strengthening. Progress with stair training as able.  4:45 PM, 08/19/22 Josue Hector PT DPT  Physical Therapist with [facility]  [phone]

## 2022-08-24 ENCOUNTER — Ambulatory Visit (HOSPITAL_COMMUNITY): Payer: Medicare Other | Admitting: Physical Therapy

## 2022-08-24 ENCOUNTER — Encounter (HOSPITAL_COMMUNITY): Payer: Self-pay | Admitting: Physical Therapy

## 2022-08-24 ENCOUNTER — Other Ambulatory Visit: Payer: Self-pay | Admitting: Internal Medicine

## 2022-08-24 DIAGNOSIS — R278 Other lack of coordination: Secondary | ICD-10-CM

## 2022-08-24 DIAGNOSIS — R29898 Other symptoms and signs involving the musculoskeletal system: Secondary | ICD-10-CM

## 2022-08-24 DIAGNOSIS — N1831 Chronic kidney disease, stage 3a: Secondary | ICD-10-CM

## 2022-08-24 DIAGNOSIS — Z1211 Encounter for screening for malignant neoplasm of colon: Secondary | ICD-10-CM

## 2022-08-24 DIAGNOSIS — M6281 Muscle weakness (generalized): Secondary | ICD-10-CM

## 2022-08-24 DIAGNOSIS — R262 Difficulty in walking, not elsewhere classified: Secondary | ICD-10-CM

## 2022-08-24 DIAGNOSIS — R2689 Other abnormalities of gait and mobility: Secondary | ICD-10-CM | POA: Diagnosis not present

## 2022-08-24 NOTE — Therapy (Signed)
OUTPATIENT PHYSICAL THERAPY NEURO Treatment   Patient Name: Matthew Wise MRN: [id number] DOB:12/19/1960, 61 y.o., male Today's Date: 08/24/2022   PCP: Lindell Spar, MD  REFERRING PROVIDER: Lindell Spar, MD    PT End of Session - 08/24/22 1437     Visit Number 5    Number of Visits 16    Date for PT Re-Evaluation 09/22/22    Authorization Type [employer]HC Medicare    Progress Note Due on Visit 10    PT Start Time 1436    PT Stop Time 1518    PT Time Calculation (min) 42 min    Equipment Utilized During Treatment Gait belt    Activity Tolerance Patient tolerated treatment well;Patient limited by fatigue    Behavior During Therapy Flat affect               Past Medical History:  Diagnosis Date   Acute CVA (cerebrovascular accident) ([place]) 09/09/2020   Chronic anticoagulation 11/14/2014   CKD (chronic kidney disease) stage 3, GFR 30-59 ml/min ([place]) 10/24/2019   CKD (chronic kidney disease) stage 3, GFR 30-59 ml/min ([place]) 10/24/2019   Clotting disorder ([place])    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 two simple tubular adenomas removed. patient was advised to go to [facility] for colonoscopy but he did not keep appt.   IR CT HEAD LTD  02/02/2021   IR PERCUTANEOUS ART  THROMBECTOMY/INFUSION INTRACRANIAL INC DIAG ANGIO  02/02/2021   RADIOLOGY WITH ANESTHESIA N/A 02/02/2021   Procedure: IR WITH ANESTHESIA;  Surgeon: Luanne Bras, MD;  Location: [place];  Service: Radiology;  Laterality: N/A;   TEE WITHOUT CARDIOVERSION N/A 09/10/2020   Procedure: TRANSESOPHAGEAL ECHOCARDIOGRAM (TEE) WITH PROPOFOL;  Surgeon: Satira Sark, MD;  Location: [facility];  Service: Cardiovascular;  Laterality: N/A;   Patient Active Problem List   Diagnosis Date Noted   Encounter for general adult medical examination with abnormal findings 07/06/2022   Stage 3a chronic kidney disease ([place]) 07/06/2022   Hospital discharge follow-up 04/30/2022   Obesity, Class I, BMI 30-34.9 04/25/2022   Generalized weakness    Near syncope    Non-seasonal allergic rhinitis 03/25/2022   Encounter for examination following treatment at hospital 02/27/2022   BPH (benign prostatic hyperplasia) 09/24/2021   History of CVA with residual deficit 09/24/2021   Dysphagia, post-stroke    Spastic hemiplegia affecting nondominant side (HCC)    Hemiparesis affecting left side as late effect of stroke ([place])    Thyroid nodule 01/25/2021   Hypothyroidism 01/25/2021   H/O adenomatous polyp of colon 12/30/2020   Essential hypertension 11/28/2020   Mixed hyperlipidemia 11/28/2020   H/O medication noncompliance 09/07/2020   Nicotine dependence 12/05/2019   Prediabetes 10/24/2019   Tubular adenoma of colon 09/14/2017   Long term (current) use  of anticoagulants 11/14/2014   Sickle cell trait ([place]) 08/29/2012   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    ONSET DATE: 2022  REFERRING DIAG: H60.737 (ICD-10-CM) - Hemiparesis affecting left side as late effect of stroke (HCC) G81.10 (ICD-10-CM) - Spastic hemiplegia affecting nondominant side (HCC)   THERAPY DIAG:  Difficulty in walking, not elsewhere classified  Other abnormalities of gait and  mobility  Muscle weakness (generalized)  Other symptoms and signs involving the musculoskeletal system  Other lack of coordination  Rationale for Evaluation and Treatment Rehabilitation  SUBJECTIVE:                                                                                                                                   SUBJECTIVE STATEMENT:  Feels he is moving a little slower today but otherwise doing well, felt no problems previous appointment..  Pt accompanied by: self  PERTINENT HISTORY: hx CVA 2020, Left hemiparesis.  PAIN:  Are you having pain? No  PRECAUTIONS: Fall  WEIGHT BEARING RESTRICTIONS No  FALLS: Has patient fallen in last 6 months? No  LIVING ENVIRONMENT: Lives with: lives with their family Lives in: House/apartment Stairs: No Has following equipment at home: Programmer, multimedia, Environmental consultant - 2 wheeled, Wheelchair (manual), and Ramped entry  PLOF: Requires assistive device for independence, Needs assistance with ADLs, and Needs assistance with homemaking  PATIENT GOALS Improve strength, walk better, reduce reliance on sister for ADLs.  OBJECTIVE:   COGNITION: Overall cognitive status: Within functional limits for tasks assessed   SENSATION: Light touch: Impaired  LLE  COORDINATION: Impaired gross and find motor coordination of LLE.      LOWER EXTREMITY MMT:  8/31- tested against gravity supine, sidelying and prone   MMT Right Eval 07/30/22 Left Eval 07/30/22  Hip flexion 4- 2+ 3- 2  Hip extension  3-  2  Hip abduction 4 3 3+ 2  Hip adduction      Hip internal rotation      Hip external rotation      Knee flexion  3+  1  Knee extension 4  4-   Ankle dorsiflexion 4+  2   Ankle plantarflexion      Ankle inversion      Ankle eversion      (Blank rows = not tested)    TRANSFERS: Assistive device utilized: Programmer, multimedia  Sit to stand: Modified independence Stand to sit: Modified independence Chair to chair: Modified  independence  RAMP:  Level of Assistance: Modified independence Assistive device utilized: Quad cane large base Ramp Comments: Reports no assist needed on ramp     GAIT: Gait pattern: step to pattern, decreased arm swing- Left, decreased stance time- Left, decreased stride length, decreased hip/knee flexion- Left, decreased ankle dorsiflexion- Left, and wide BOS Distance walked: 30 Assistive device utilized: Quad cane large base Level of assistance: Modified  independence Comments: Hemiparetic gait, slow, no evidence of buckling, significant drop foot on Lt, lacks heel strike  FUNCTIONAL TESTs:  5 times sit to stand: 21 single hand 2 minute walk test: 30 feet with [facility]    TODAY'S TREATMENT:  08/24/22 Sit to stand x 10 1 HHA on armrest to rise, on knee to descend (from w/c for lower height challenge) Seated hip adduction 10 x 5" Seated Hamstring curl BTB (0 to nearly 90) deg 10 x 5" LLE supported HS curl from 90 to max x10 Seated foot on soccer ball knee ext/flex x10 with min assist for alignment x10  Standing march bil hands on // bars x 10  ea   Gait training with [facility], various distances up to 55 feet with turns in bil directions Stair training with single rail, cues for sequencing, teach-back for retention, step-to pattern CGA 1 RT 7"    08/19/22 Sit to stand x 10  Seated hip adduction 10 x 5" Seated Hamstring curl BTB 10 x 5"  Standing LLE heel tap 4 inch 2 x 10 with rest break x 1 Sidestepping in parallel bars 3RT     08/18/22: WC to mat transfer with [facility] Prone:Rt hip extension 5 reps 3" holds  Knee flexion, massager used on Lt hamstrings Sidelying: Rt abd 2sets 10reps 3" holds Supine: Adduction Lt with ball squeeze 10 x5"  Bridge 2x 10 3"  LT hip abduction Sit to stand 10x hands on thigh Sidestep front of mat 3RT Weight shifting    PATIENT EDUCATION: Education details: Findings, POC, HEP Person educated: Patient Education method: Explanation Education  comprehension: verbalized understanding   HOME EXERCISE PROGRAM:   Access Code: [email or web address] URL: [email or web address] Date: 07/28/2022 Prepared by: Josue Hector  Exercises - Proper Sit to Stand Technique  - 2 x daily - 7 x weekly - 3 sets - 5 reps - Standing March with Counter Support  - 2 x daily - 7 x weekly - 3 sets - 10 reps   08/18/22: Bridge with belt to reduce ER   GOALS: Goals reviewed with patient? Yes  SHORT TERM GOALS: Target date: 08/25/2022  Patient will be independent with initial HEP and self-management strategies to improve functional outcomes Baseline: Initiated Goal status: IN PROGRESS   2. Patient will demonstrate ability to ambulate 100 feet at Mod I level with LRAD without seated rest break.   Baseline: 30 feet  Goal Status In Progress   LONG TERM GOALS: Target date: 09/22/2022  Patient will be independent with advanced HEP and self-management strategies to improve functional outcomes Baseline: Initiated Goal status: IN PROGRESS  2.  Patient will improve 5 time sit to stand <15 seconds to predicted value to indicate improvement in functional outcomes Baseline: 21 seconds with single hand Goal status: IN PROGRESS   3. Patient will have equal to or > 4+/5 MMT throughout BIL LEs to improve ability to perform functional mobility, stair ambulation and ADLs.  Baseline: See note Goal status: IN PROGRESS  4. Patient will be able to ambulate at least 45 feet during 2MWT with LRAD to demonstrate improved ability to perform functional mobility and associated tasks. Baseline: 30 feet with WBQC Goal status: IN PROGRESS  5. Patient will demonstrate ability to safely navigate 5 steps with single rail and LRAD at Mod I level for meeting transport services in front of his house.  Baseline: Patient has not attempted  Goal Status:in Progress    ASSESSMENT:  CLINICAL IMPRESSION:   Sit<>stand repeats, tactile  cues for alignment and symmetry with  ascent and descent, forcing more WB through LLE for awareness and confidence, shared weight distribution, single hand to rise. Progressed gait training and with cues to modify stride, hip alignment and WBQC placement, good carry over with subsequent bouts of ambulation. Stair training with CGA, no buckling, good sequencing and able to teach-back approach appropriately.   OBJECTIVE IMPAIRMENTS Abnormal gait, decreased activity tolerance, decreased balance, decreased coordination, decreased endurance, decreased knowledge of use of DME, decreased mobility, difficulty walking, decreased ROM, decreased strength, decreased safety awareness, and impaired sensation.   ACTIVITY LIMITATIONS carrying, lifting, stairs, transfers, bed mobility, and bathing  PARTICIPATION LIMITATIONS: meal prep, cleaning, laundry, and community activity  PERSONAL FACTORS Time since onset of injury/illness/exacerbation are also affecting patient's functional outcome.   REHAB POTENTIAL: Fair Chronic hemiparesis  CLINICAL DECISION MAKING: Stable/uncomplicated  EVALUATION COMPLEXITY: Low  PLAN: PT FREQUENCY: 2x/week  PT DURATION: 8 weeks  PLANNED INTERVENTIONS: Therapeutic exercises, Therapeutic activity, Neuromuscular re-education, Balance training, Gait training, Patient/Family education, Self Care, Joint mobilization, Stair training, and Wheelchair mobility training  PLAN FOR NEXT SESSION:  work on sit to stand I from lower heights as pt is having difficulty with this at home. Gait training with WBQC vs Hemi Walker. Gross strengthening. Progress with stair training as able.  3:28 PM, 08/24/22 Candie Mile, PT, DPT Physical Therapist Acute Rehabilitation Services [facility]

## 2022-08-25 ENCOUNTER — Ambulatory Visit (HOSPITAL_COMMUNITY): Payer: Medicare Other | Admitting: Physical Therapy

## 2022-08-27 ENCOUNTER — Ambulatory Visit (HOSPITAL_COMMUNITY): Payer: Medicare Other | Admitting: Physical Therapy

## 2022-08-27 DIAGNOSIS — M6281 Muscle weakness (generalized): Secondary | ICD-10-CM

## 2022-08-27 DIAGNOSIS — R29898 Other symptoms and signs involving the musculoskeletal system: Secondary | ICD-10-CM | POA: Diagnosis not present

## 2022-08-27 DIAGNOSIS — R2689 Other abnormalities of gait and mobility: Secondary | ICD-10-CM

## 2022-08-27 DIAGNOSIS — R278 Other lack of coordination: Secondary | ICD-10-CM | POA: Diagnosis not present

## 2022-08-27 DIAGNOSIS — R262 Difficulty in walking, not elsewhere classified: Secondary | ICD-10-CM | POA: Diagnosis not present

## 2022-08-27 NOTE — Therapy (Signed)
OUTPATIENT PHYSICAL THERAPY NEURO Treatment   Patient Name: Matthew Wise MRN: [id number] DOB:[birth year], 61 y.o., male Today's Date: 08/27/2022   PCP: Lindell Spar, MD  REFERRING PROVIDER: Lindell Spar, MD    PT End of Session - 08/27/22 1512     Visit Number 6    Number of Visits 16    Date for PT Re-Evaluation 09/22/22    Authorization Type [employer] Medicare    Progress Note Due on Visit 10    PT Start Time 1516    PT Stop Time 1555    PT Time Calculation (min) 39 min    Equipment Utilized During Treatment Gait belt    Activity Tolerance Patient tolerated treatment well;Patient limited by fatigue    Behavior During Therapy Flat affect               Past Medical History:  Diagnosis Date   Acute CVA (cerebrovascular accident) ([place]) 09/09/2020   Chronic anticoagulation 11/14/2014   CKD (chronic kidney disease) stage 3, GFR 30-59 ml/min ([place]) 10/24/2019   CKD (chronic kidney disease) stage 3, GFR 30-59 ml/min ([place]) 10/24/2019   Clotting disorder ([place])    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 two simple tubular adenomas removed. patient was advised to go to [facility] for colonoscopy but he did not keep appt.   IR CT HEAD LTD  02/02/2021   IR PERCUTANEOUS ART  THROMBECTOMY/INFUSION INTRACRANIAL INC DIAG ANGIO  02/02/2021   RADIOLOGY WITH ANESTHESIA N/A 02/02/2021   Procedure: IR WITH ANESTHESIA;  Surgeon: Luanne Bras, MD;  Location: [facility];  Service: Radiology;  Laterality: N/A;   TEE WITHOUT CARDIOVERSION N/A 09/10/2020   Procedure: TRANSESOPHAGEAL ECHOCARDIOGRAM (TEE) WITH PROPOFOL;  Surgeon: Satira Sark, MD;  Location: [facility];  Service: Cardiovascular;  Laterality: N/A;   Patient Active Problem List   Diagnosis Date Noted   Encounter for general adult medical examination with abnormal findings 07/06/2022   Stage 3a chronic kidney disease ([place]) 07/06/2022   Hospital discharge follow-up 04/30/2022   Obesity, Class I, BMI 30-34.9 04/25/2022   Generalized weakness    Near syncope    Non-seasonal allergic rhinitis 03/25/2022   Encounter for examination following treatment at hospital 02/27/2022   BPH (benign prostatic hyperplasia) 09/24/2021   History of CVA with residual deficit 09/24/2021   Dysphagia, post-stroke    Spastic hemiplegia affecting nondominant side (HCC)    Hemiparesis affecting left side as late effect of stroke ([place])    Thyroid nodule 01/25/2021   Hypothyroidism 01/25/2021   H/O adenomatous polyp of colon 12/30/2020   Essential hypertension 11/28/2020   Mixed hyperlipidemia 11/28/2020   H/O medication noncompliance 09/07/2020   Nicotine dependence 12/05/2019   Prediabetes 10/24/2019   Tubular adenoma of colon 09/14/2017   Long term (current) use  of anticoagulants 11/14/2014   Sickle cell trait ([place]) 08/29/2012   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 Doing good with HEP. No pain today.   Pt accompanied by: self  PERTINENT HISTORY: hx CVA 2020, Left hemiparesis.  PAIN:  Are you having pain? No  PRECAUTIONS: Fall  WEIGHT BEARING RESTRICTIONS No  FALLS: Has patient fallen in last 6 months? No  LIVING ENVIRONMENT: Lives with: lives with their family Lives in: House/apartment Stairs: No Has following equipment at home: Programmer, multimedia, Environmental consultant - 2 wheeled, Wheelchair (manual), and Ramped entry  PLOF: Requires assistive device for independence, Needs assistance with ADLs, and Needs assistance with homemaking  PATIENT GOALS Improve strength, walk better, reduce reliance on sister for ADLs.  OBJECTIVE:   COGNITION: Overall cognitive status: Within functional limits for tasks assessed   SENSATION: Light touch: Impaired  LLE  COORDINATION: Impaired gross and find motor coordination of LLE.      LOWER EXTREMITY MMT:  8/31- tested against gravity supine, sidelying and prone   MMT Right Eval 07/30/22 Left Eval 07/30/22  Hip flexion 4- 2+ 3- 2  Hip extension  3-  2  Hip abduction 4 3 3+ 2  Hip adduction      Hip internal rotation      Hip external rotation      Knee flexion  3+  1  Knee extension 4  4-   Ankle dorsiflexion 4+  2   Ankle plantarflexion      Ankle inversion      Ankle eversion      (Blank rows = not tested)    TRANSFERS: Assistive device utilized: Programmer, multimedia  Sit to stand: Modified independence Stand to sit: Modified independence Chair to chair: Modified independence  RAMP:  Level of Assistance: Modified independence Assistive device utilized: Quad cane large base Ramp Comments: Reports no assist needed on ramp     GAIT: Gait pattern: step to  pattern, decreased arm swing- Left, decreased stance time- Left, decreased stride length, decreased hip/knee flexion- Left, decreased ankle dorsiflexion- Left, and wide BOS Distance walked: 30 Assistive device utilized: Quad cane large base Level of assistance: Modified independence Comments: Hemiparetic gait, slow, no evidence of buckling, significant drop foot on Lt, lacks heel strike  FUNCTIONAL TESTs:  5 times sit to stand: 21  single hand 2 minute walk test: 30 feet with [facility]    TODAY'S TREATMENT:  08/27/22 Sit to stand x 10 RUE assist  Seated Hamstring curl BTB 10 x 5" LLE kick to therapist hand (seated) x20  Seated medial/ lateral obstacle step over (used 4lb dumbbell) x15 with Assist for hip adduction   Standing LLE heel tap 4 inch x15 Step ups on RLE 4 inch x10 HHA x1  Gait in parallell bars 4 RT using RUE on rails   08/24/22 Sit to stand x 10 1 HHA on armrest to rise, on knee to descend (from w/c for lower height challenge) Seated hip adduction 10 x 5" Seated Hamstring curl BTB (0 to nearly 90) deg 10 x 5" LLE supported HS curl from 90 to max x10 Seated foot on soccer ball knee ext/flex x10 with min assist for alignment x10  Standing march bil hands on // bars x 10  ea   Gait training with [facility], various distances up to 55 feet with turns in bil directions Stair training with single rail, cues for sequencing, teach-back for retention, step-to pattern CGA 1 RT 7"  08/19/22 Sit to stand x 10  Seated hip adduction 10 x 5" Seated Hamstring curl BTB 10 x 5"  Standing LLE heel tap 4 inch 2 x 10 with rest break x 1 Sidestepping in parallel bars 3RT    PATIENT EDUCATION: Education details: Findings, POC, HEP Person educated: Patient Education method: Explanation Education comprehension: verbalized understanding   HOME EXERCISE PROGRAM:   Access Code: [email or web address] URL: [email or web address] Date: 07/28/2022 Prepared by: Josue Hector  Exercises -  Proper Sit to Stand Technique  - 2 x daily - 7 x weekly - 3 sets - 5 reps - Standing March with Counter Support  - 2 x daily - 7 x weekly - 3 sets - 10 reps   08/18/22: Bridge with belt to reduce ER   GOALS: Goals reviewed with patient? Yes  SHORT TERM GOALS: Target date: 08/25/2022  Patient will be independent with initial HEP and self-management strategies to improve functional outcomes Baseline: Initiated Goal status: IN PROGRESS   2. Patient will demonstrate ability to ambulate 100 feet at Mod I level with LRAD without seated rest break.   Baseline: 30 feet  Goal Status In Progress   LONG TERM GOALS: Target date: 09/22/2022  Patient will be independent with advanced HEP and self-management strategies to improve functional outcomes Baseline: Initiated Goal status: IN PROGRESS  2.  Patient will improve 5 time sit to stand <15 seconds to predicted value to indicate improvement in functional outcomes Baseline: 21 seconds with single hand Goal status: IN PROGRESS   3. Patient will have equal to or > 4+/5 MMT throughout BIL LEs to improve ability to perform functional mobility, stair ambulation and ADLs.  Baseline: See note Goal status: IN PROGRESS  4. Patient will be able to ambulate at least 45 feet during 2MWT with LRAD to demonstrate improved ability to perform functional mobility and associated tasks. Baseline: 30 feet with [facility] Goal status: IN PROGRESS  5. Patient will demonstrate ability to safely navigate 5 steps with single rail and LRAD at Mod I level for meeting transport services in front of his house.  Baseline: Patient has not attempted  Goal Status:in Progress    ASSESSMENT:  CLINICAL IMPRESSION:   Patein tolerated session well today. Improved speed with sit to stands this date with cues for increased cadence. Improved self awareness of LLE placement. Good tolerance  with ambulation in bars today. Notes mild fatigue. Continues to need cues with LLE placement  and ground clearance with step taps and marching activity. Ongoing gait deficits due to LLE weakness. Patient will continue to benefit from skilled therapy services to reduce remaining deficits and improve functional ability.    OBJECTIVE IMPAIRMENTS Abnormal gait, decreased activity tolerance, decreased balance, decreased coordination, decreased endurance, decreased knowledge of use of DME, decreased mobility, difficulty walking, decreased ROM, decreased strength, decreased safety awareness, and impaired sensation.   ACTIVITY LIMITATIONS carrying, lifting, stairs, transfers, bed mobility, and bathing  PARTICIPATION LIMITATIONS: meal prep, cleaning, laundry, and community activity  PERSONAL FACTORS Time since onset of injury/illness/exacerbation are also affecting patient's functional outcome.   REHAB POTENTIAL: Fair Chronic hemiparesis  CLINICAL DECISION MAKING: Stable/uncomplicated  EVALUATION COMPLEXITY: Low  PLAN: PT FREQUENCY: 2x/week  PT DURATION: 8 weeks  PLANNED INTERVENTIONS: Therapeutic exercises, Therapeutic activity, Neuromuscular re-education, Balance training, Gait training, Patient/Family education, Self Care, Joint mobilization, Stair training, and Wheelchair mobility training  PLAN FOR NEXT SESSION:  work on sit to stand I from lower heights as pt is having difficulty with this at home. Gait training with [facility]QC vs Hemi Walker. Gross strengthening. Progress with stair training as able.  3:59 PM, 08/27/22 Josue Hector PT DPT  Physical Therapist with [facility]  [phone]

## 2022-09-01 ENCOUNTER — Ambulatory Visit (HOSPITAL_COMMUNITY): Payer: Medicare Other | Attending: Internal Medicine | Admitting: Physical Therapy

## 2022-09-01 DIAGNOSIS — R262 Difficulty in walking, not elsewhere classified: Secondary | ICD-10-CM | POA: Insufficient documentation

## 2022-09-01 DIAGNOSIS — R2689 Other abnormalities of gait and mobility: Secondary | ICD-10-CM | POA: Insufficient documentation

## 2022-09-01 DIAGNOSIS — R29898 Other symptoms and signs involving the musculoskeletal system: Secondary | ICD-10-CM | POA: Diagnosis not present

## 2022-09-01 DIAGNOSIS — M6281 Muscle weakness (generalized): Secondary | ICD-10-CM | POA: Diagnosis not present

## 2022-09-01 NOTE — Therapy (Signed)
OUTPATIENT PHYSICAL THERAPY NEURO Treatment   Patient Name: Matthew Wise MRN: [id number] DOB:Apr 24, 1961, 61 y.o., male Today's Date: 09/01/2022   PCP: Lindell Spar, MD  REFERRING PROVIDER: Lindell Spar, MD    PT End of Session - 09/01/22 1522     Visit Number 7    Number of Visits 16    Date for PT Re-Evaluation 09/22/22    Authorization Type [employer] Medicare    Progress Note Due on Visit 10    PT Start Time 1518    PT Stop Time 1557    PT Time Calculation (min) 39 min    Equipment Utilized During Treatment Gait belt    Activity Tolerance Patient tolerated treatment well;Patient limited by fatigue    Behavior During Therapy Flat affect               Past Medical History:  Diagnosis Date   Acute CVA (cerebrovascular accident) ([place]) 09/09/2020   Chronic anticoagulation 11/14/2014   CKD (chronic kidney disease) stage 3, GFR 30-59 ml/min ([place]) 10/24/2019   CKD (chronic kidney disease) stage 3, GFR 30-59 ml/min ([place]) 10/24/2019   Clotting disorder ([place])    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 two simple tubular adenomas removed. patient was advised to go to [facility] for colonoscopy but he did not keep appt.   IR CT HEAD LTD  02/02/2021   IR PERCUTANEOUS ART  THROMBECTOMY/INFUSION INTRACRANIAL INC DIAG ANGIO  02/02/2021   RADIOLOGY WITH ANESTHESIA N/A 02/02/2021   Procedure: IR WITH ANESTHESIA;  Surgeon: Luanne Bras, MD;  Location: [facility];  Service: Radiology;  Laterality: N/A;   TEE WITHOUT CARDIOVERSION N/A 09/10/2020   Procedure: TRANSESOPHAGEAL ECHOCARDIOGRAM (TEE) WITH PROPOFOL;  Surgeon: Satira Sark, MD;  Location: [facility];  Service: Cardiovascular;  Laterality: N/A;   Patient Active Problem List   Diagnosis Date Noted   Encounter for general adult medical examination with abnormal findings 07/06/2022   Stage 3a chronic kidney disease ([place]) 07/06/2022   Hospital discharge follow-up 04/30/2022   Obesity, Class I, BMI 30-34.9 04/25/2022   Generalized weakness    Near syncope    Non-seasonal allergic rhinitis 03/25/2022   Encounter for examination following treatment at hospital 02/27/2022   BPH (benign prostatic hyperplasia) 09/24/2021   History of CVA with residual deficit 09/24/2021   Dysphagia, post-stroke    Spastic hemiplegia affecting nondominant side (HCC)    Hemiparesis affecting left side as late effect of stroke ([place])    Thyroid nodule 01/25/2021   Hypothyroidism 01/25/2021   H/O adenomatous polyp of colon 12/30/2020   Essential hypertension 11/28/2020   Mixed hyperlipidemia 11/28/2020   H/O medication noncompliance 09/07/2020   Nicotine dependence 12/05/2019   Prediabetes 10/24/2019   Tubular adenoma of colon 09/14/2017   Long term (current) use  of anticoagulants 11/14/2014   Sickle cell trait ([place]) 08/29/2012   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 Doing good today.    Pt accompanied by: self  PERTINENT HISTORY: hx CVA 2020, Left hemiparesis.  PAIN:  Are you having pain? No  PRECAUTIONS: Fall  WEIGHT BEARING RESTRICTIONS No  FALLS: Has patient fallen in last 6 months? No  LIVING ENVIRONMENT: Lives with: lives with their family Lives in: House/apartment Stairs: No Has following equipment at home: Programmer, multimedia, Environmental consultant - 2 wheeled, Wheelchair (manual), and Ramped entry  PLOF: Requires assistive device for independence, Needs assistance with ADLs, and Needs assistance with homemaking  PATIENT GOALS Improve strength, walk better, reduce reliance on sister for ADLs.  OBJECTIVE:   COGNITION: Overall cognitive status: Within functional limits for tasks assessed   SENSATION: Light touch: Impaired  LLE  COORDINATION: Impaired gross and find motor coordination of LLE.      LOWER EXTREMITY MMT:  8/31- tested against gravity supine, sidelying and prone   MMT Right Eval 07/30/22 Left Eval 07/30/22  Hip flexion 4- 2+ 3- 2  Hip extension  3-  2  Hip abduction 4 3 3+ 2  Hip adduction      Hip internal rotation      Hip external rotation      Knee flexion  3+  1  Knee extension 4  4-   Ankle dorsiflexion 4+  2   Ankle plantarflexion      Ankle inversion      Ankle eversion      (Blank rows = not tested)    TRANSFERS: Assistive device utilized: Programmer, multimedia  Sit to stand: Modified independence Stand to sit: Modified independence Chair to chair: Modified independence  RAMP:  Level of Assistance: Modified independence Assistive device utilized: Quad cane large base Ramp Comments: Reports no assist needed on  ramp     GAIT: Gait pattern: step to pattern, decreased arm swing- Left, decreased stance time- Left, decreased stride length, decreased hip/knee flexion- Left, decreased ankle dorsiflexion- Left, and wide BOS Distance walked: 30 Assistive device utilized: Quad cane large base Level of assistance: Modified independence Comments: Hemiparetic gait, slow, no evidence of buckling, significant drop foot on Lt, lacks heel strike  FUNCTIONAL TESTs:  5 times sit to stand: 21 single hand 2 minute walk test: 30 feet with [facility]    TODAY'S TREATMENT:  09/01/22 Seated Hamstring curl BTB 10 x 5" LLE kick to therapist hand (seated) x20  Seated medial/ lateral obstacle step over (used 4lb dumbbell) 2 x 10 Mini squat with HHA x 1 10 reps    Standing LLE heel tap 6 inch x15 Clinic amb with quad cane 100 feet   08/27/22 Sit to stand x 10 RUE assist  Seated Hamstring curl BTB 10 x 5" LLE kick to therapist hand (seated) x20  Seated medial/ lateral obstacle step over (used 4lb dumbbell) x15 with Assist for hip adduction   Standing LLE heel tap 4 inch x15 Step ups on RLE 4 inch x10 HHA x1  Gait in parallell bars 4 RT using RUE on rails   08/24/22 Sit to stand x 10 1 HHA on armrest to rise, on knee to descend (from w/c for lower height challenge) Seated hip adduction 10 x 5" Seated Hamstring curl BTB (0 to nearly 90) deg 10 x 5" LLE supported HS curl from 90 to max x10 Seated foot on soccer ball knee ext/flex x10 with min assist for alignment x10  Standing march bil hands on // bars x 10  ea   Gait training with [facility], various distances up to 55 feet with turns in bil directions Stair training with single rail, cues for sequencing, teach-back for retention, step-to pattern CGA 1 RT 7"  08/19/22 Sit to stand x 10  Seated hip adduction 10 x 5" Seated Hamstring curl BTB 10 x 5"  Standing LLE heel tap 4 inch 2 x 10 with rest break x 1 Sidestepping in parallel bars 3RT    PATIENT  EDUCATION: Education details: Findings, POC, HEP Person educated: Patient Education method: Explanation Education comprehension: verbalized understanding   HOME EXERCISE PROGRAM:   Access Code: [email or web address] URL: [email or web address] Date: 07/28/2022 Prepared by: Josue Hector  Exercises - Proper Sit to Stand Technique  - 2 x daily - 7 x weekly - 3 sets - 5 reps - Standing March with Counter Support  - 2 x daily - 7 x weekly - 3 sets - 10 reps   08/18/22: Bridge with belt to reduce ER   GOALS: Goals reviewed with patient? Yes  SHORT TERM GOALS: Target date: 08/25/2022  Patient will be independent with initial HEP and self-management strategies to improve functional outcomes Baseline: Initiated Goal status: IN PROGRESS   2. Patient will demonstrate ability to ambulate 100 feet at Mod I level with LRAD without seated rest break.   Baseline: 30 feet  Goal Status In Progress   LONG TERM GOALS: Target date: 09/22/2022  Patient will be independent with advanced HEP and self-management strategies to improve functional outcomes Baseline: Initiated Goal status: IN PROGRESS  2.  Patient will improve 5 time sit to stand <15 seconds to predicted value to indicate improvement in functional outcomes Baseline: 21 seconds with single hand Goal status: IN PROGRESS   3. Patient will have equal to or > 4+/5 MMT throughout BIL LEs to improve ability to perform functional mobility, stair ambulation and ADLs.  Baseline: See note Goal status: IN PROGRESS  4. Patient will be able to ambulate at least 45 feet during 2MWT with LRAD to demonstrate improved ability to perform functional mobility and associated tasks. Baseline: 30 feet with [facility] Goal status: IN PROGRESS  5. Patient will demonstrate ability to safely navigate 5 steps with single  rail and LRAD at Mod I level for meeting transport services in front of his house.  Baseline: Patient has not attempted  Goal Status:in  Progress    ASSESSMENT:  CLINICAL IMPRESSION:   Patient continues to struggle with LLE coordination and placement. Patient requires ongoing verbal cues for exaggerated hip movement to improve ground clearance when stepping with LT leg. Continued working on Aeronautical engineer and stability using quad cane in clinic. Patient will continue to benefit from skilled therapy services to reduce remaining deficits and improve functional ability.     OBJECTIVE IMPAIRMENTS Abnormal gait, decreased activity tolerance, decreased balance, decreased coordination, decreased endurance, decreased knowledge of use of DME, decreased mobility, difficulty walking, decreased ROM, decreased strength, decreased safety awareness, and impaired sensation.   ACTIVITY LIMITATIONS carrying, lifting, stairs, transfers, bed mobility, and bathing  PARTICIPATION LIMITATIONS: meal prep, cleaning, laundry, and community activity  PERSONAL FACTORS Time since onset of injury/illness/exacerbation are also affecting patient's functional outcome.   REHAB POTENTIAL: Fair Chronic hemiparesis  CLINICAL DECISION MAKING: Stable/uncomplicated  EVALUATION COMPLEXITY: Low  PLAN: PT FREQUENCY: 2x/week  PT DURATION: 8 weeks  PLANNED INTERVENTIONS: Therapeutic exercises, Therapeutic activity, Neuromuscular re-education, Balance training, Gait training, Patient/Family education, Self Care, Joint mobilization, Stair training, and Wheelchair mobility training  PLAN FOR NEXT SESSION:  Gait training with WBQC vs Hemi Walker. Gross strengthening. Progress with stair training as able.  3:22 PM, 09/01/22 Josue Hector PT DPT   Physical Therapist with [facility]  [phone]

## 2022-09-03 ENCOUNTER — Ambulatory Visit (HOSPITAL_COMMUNITY): Payer: Medicare Other | Admitting: Physical Therapy

## 2022-09-03 DIAGNOSIS — R29898 Other symptoms and signs involving the musculoskeletal system: Secondary | ICD-10-CM | POA: Diagnosis not present

## 2022-09-03 DIAGNOSIS — M6281 Muscle weakness (generalized): Secondary | ICD-10-CM | POA: Diagnosis not present

## 2022-09-03 DIAGNOSIS — R2689 Other abnormalities of gait and mobility: Secondary | ICD-10-CM | POA: Diagnosis not present

## 2022-09-03 DIAGNOSIS — R262 Difficulty in walking, not elsewhere classified: Secondary | ICD-10-CM | POA: Diagnosis not present

## 2022-09-03 NOTE — Therapy (Signed)
OUTPATIENT PHYSICAL THERAPY NEURO Treatment   Patient Name: Matthew Wise MRN: [id number] DOB:Oct 25, 1961, 61 y.o., male Today's Date: 09/03/2022   PCP: Lindell Spar, MD  REFERRING PROVIDER: Lindell Spar, MD    PT End of Session - 09/03/22 1516     Visit Number 8    Number of Visits 16    Date for PT Re-Evaluation 09/22/22    Authorization Type [employer] Medicare    Progress Note Due on Visit 10    PT Start Time 1516    PT Stop Time 1555    PT Time Calculation (min) 39 min    Equipment Utilized During Treatment Gait belt    Activity Tolerance Patient tolerated treatment well;Patient limited by fatigue    Behavior During Therapy Flat affect               Past Medical History:  Diagnosis Date   Acute CVA (cerebrovascular accident) ([place]) 09/09/2020   Chronic anticoagulation 11/14/2014   CKD (chronic kidney disease) stage 3, GFR 30-59 ml/min ([place]) 10/24/2019   CKD (chronic kidney disease) stage 3, GFR 30-59 ml/min ([place]) 10/24/2019   Clotting disorder ([place])    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 two simple tubular adenomas removed. patient was advised to go to [facility] for colonoscopy but he did not keep appt.   IR CT HEAD LTD  02/02/2021   IR PERCUTANEOUS ART  THROMBECTOMY/INFUSION INTRACRANIAL INC DIAG ANGIO  02/02/2021   RADIOLOGY WITH ANESTHESIA N/A 02/02/2021   Procedure: IR WITH ANESTHESIA;  Surgeon: Luanne Bras, MD;  Location: [facility];  Service: Radiology;  Laterality: N/A;   TEE WITHOUT CARDIOVERSION N/A 09/10/2020   Procedure: TRANSESOPHAGEAL ECHOCARDIOGRAM (TEE) WITH PROPOFOL;  Surgeon: Satira Sark, MD;  Location: [facility];  Service: Cardiovascular;  Laterality: N/A;   Patient Active Problem List   Diagnosis Date Noted   Encounter for general adult medical examination with abnormal findings 07/06/2022   Stage 3a chronic kidney disease ([place]) 07/06/2022   Hospital discharge follow-up 04/30/2022   Obesity, Class I, BMI 30-34.9 04/25/2022   Generalized weakness    Near syncope    Non-seasonal allergic rhinitis 03/25/2022   Encounter for examination following treatment at hospital 02/27/2022   BPH (benign prostatic hyperplasia) 09/24/2021   History of CVA with residual deficit 09/24/2021   Dysphagia, post-stroke    Spastic hemiplegia affecting nondominant side (HCC)    Hemiparesis affecting left side as late effect of stroke ([place])    Thyroid nodule 01/25/2021   Hypothyroidism 01/25/2021   H/O adenomatous polyp of colon 12/30/2020   Essential hypertension 11/28/2020   Mixed hyperlipidemia 11/28/2020   H/O medication noncompliance 09/07/2020   Nicotine dependence 12/05/2019   Prediabetes 10/24/2019   Tubular adenoma of colon 09/14/2017   Long term (current) use  of anticoagulants 11/14/2014   Sickle cell trait ([place]) 08/29/2012   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 No new issues.   Pt accompanied by: self  PERTINENT HISTORY: hx CVA 2020, Left hemiparesis.  PAIN:  Are you having pain? No  PRECAUTIONS: Fall  WEIGHT BEARING RESTRICTIONS No  FALLS: Has patient fallen in last 6 months? No  LIVING ENVIRONMENT: Lives with: lives with their family Lives in: House/apartment Stairs: No Has following equipment at home: Programmer, multimedia, Environmental consultant - 2 wheeled, Wheelchair (manual), and Ramped entry  PLOF: Requires assistive device for independence, Needs assistance with ADLs, and Needs assistance with homemaking  PATIENT GOALS Improve strength, walk better, reduce reliance on sister for ADLs.  OBJECTIVE:   COGNITION: Overall cognitive status: Within functional limits for tasks assessed   SENSATION: Light touch: Impaired  LLE  COORDINATION: Impaired gross and find motor coordination of LLE.      LOWER EXTREMITY MMT:  8/31- tested against gravity supine, sidelying and prone   MMT Right Eval 07/30/22 Left Eval 07/30/22  Hip flexion 4- 2+ 3- 2  Hip extension  3-  2  Hip abduction 4 3 3+ 2  Hip adduction      Hip internal rotation      Hip external rotation      Knee flexion  3+  1  Knee extension 4  4-   Ankle dorsiflexion 4+  2   Ankle plantarflexion      Ankle inversion      Ankle eversion      (Blank rows = not tested)    TRANSFERS: Assistive device utilized: Programmer, multimedia  Sit to stand: Modified independence Stand to sit: Modified independence Chair to chair: Modified independence  RAMP:  Level of Assistance: Modified independence Assistive device utilized: Quad cane large base Ramp Comments: Reports no assist needed on ramp     GAIT: Gait pattern: step to pattern,  decreased arm swing- Left, decreased stance time- Left, decreased stride length, decreased hip/knee flexion- Left, decreased ankle dorsiflexion- Left, and wide BOS Distance walked: 30 Assistive device utilized: Quad cane large base Level of assistance: Modified independence Comments: Hemiparetic gait, slow, no evidence of buckling, significant drop foot on Lt, lacks heel strike  FUNCTIONAL TESTs:  5 times sit to stand: 21 single hand  2 minute walk test: 30 feet with WBQC    TODAY'S TREATMENT:  09/03/22 Sit to stand x10 HHA x 1 Step taps 4 inch 2# x15 LLE HHA x 1 Step ups 4 inch 2#  x15 RLE HHA x 1 Sidestepping at counter x 6 RT Heel raise x10  Clinic ambulation 110 feet with RW and WC follow    09/01/22 Seated Hamstring curl BTB 10 x 5" LLE kick to therapist hand (seated) x20  Seated medial/ lateral obstacle step over (used 4lb dumbbell) 2 x 10 Mini squat with HHA x 1 10 reps    Standing LLE heel tap 6 inch x15 Clinic amb with quad cane 100 feet   08/27/22 Sit to stand x 10 RUE assist  Seated Hamstring curl BTB 10 x 5" LLE kick to therapist hand (seated) x20  Seated medial/ lateral obstacle step over (used 4lb dumbbell) x15 with Assist for hip adduction   Standing LLE heel tap 4 inch x15 Step ups on RLE 4 inch x10 HHA x1  Gait in parallell bars 4 RT using RUE on rails   PATIENT EDUCATION: Education details: Findings, POC, HEP Person educated: Patient Education method: Explanation Education comprehension: verbalized understanding   HOME EXERCISE PROGRAM:   Access Code: [email or web address] URL: [email or web address] Date: 07/28/2022 Prepared by: Josue Hector  Exercises - Proper Sit to Stand Technique  - 2 x daily - 7 x weekly - 3 sets - 5 reps - Standing March with Counter Support  - 2 x daily - 7 x weekly - 3 sets - 10 reps   08/18/22: Bridge with belt to reduce ER   GOALS: Goals reviewed with patient? Yes  SHORT TERM GOALS: Target date:  08/25/2022  Patient will be independent with initial HEP and self-management strategies to improve functional outcomes Baseline: Initiated Goal status: IN PROGRESS   2. Patient will demonstrate ability to ambulate 100 feet at Mod I level with LRAD without seated rest break.   Baseline: 30 feet  Goal Status In Progress   LONG TERM GOALS: Target date: 09/22/2022  Patient will be independent with advanced HEP and self-management strategies to improve functional outcomes Baseline: Initiated Goal status: IN PROGRESS  2.  Patient will improve 5 time sit to stand <15 seconds to predicted value to indicate improvement in functional outcomes Baseline: 21 seconds with single hand Goal status: IN PROGRESS   3. Patient will have equal to or > 4+/5 MMT throughout BIL LEs to improve ability to perform functional mobility, stair ambulation and ADLs.  Baseline: See note Goal status: IN PROGRESS  4. Patient will be able to ambulate at least 45 feet during 2MWT with LRAD to demonstrate improved ability to perform functional mobility and associated tasks. Baseline: 30 feet with WBQC Goal status: IN PROGRESS  5. Patient will demonstrate ability to safely navigate 5 steps with single rail and LRAD at Mod I level for meeting transport services in front of his house.  Baseline: Patient has not attempted  Goal Status:in Progress    ASSESSMENT:  CLINICAL IMPRESSION:   Continues working on LE placement with standing activity and gait. Patient requires ongoing verbal cues to lift LLE for appropriate ground clearance. Showed improved activity tolerance with increased distance ambulated today. Patient will continue to benefit from skilled therapy services to reduce remaining deficits and improve functional ability.    OBJECTIVE IMPAIRMENTS Abnormal gait, decreased activity tolerance, decreased balance, decreased coordination, decreased endurance, decreased knowledge of use of DME, decreased mobility,  difficulty walking,  decreased ROM, decreased strength, decreased safety awareness, and impaired sensation.   ACTIVITY LIMITATIONS carrying, lifting, stairs, transfers, bed mobility, and bathing  PARTICIPATION LIMITATIONS: meal prep, cleaning, laundry, and community activity  PERSONAL FACTORS Time since onset of injury/illness/exacerbation are also affecting patient's functional outcome.   REHAB POTENTIAL: Fair Chronic hemiparesis  CLINICAL DECISION MAKING: Stable/uncomplicated  EVALUATION COMPLEXITY: Low  PLAN: PT FREQUENCY: 2x/week  PT DURATION: 8 weeks  PLANNED INTERVENTIONS: Therapeutic exercises, Therapeutic activity, Neuromuscular re-education, Balance training, Gait training, Patient/Family education, Self Care, Joint mobilization, Stair training, and Wheelchair mobility training  PLAN FOR NEXT SESSION:  Gait training with WBQC vs Hemi Walker. [place] strengthening. Progress with stair training as able.  3:17 PM, 09/03/22 Josue Hector PT DPT   Physical Therapist with [facility]  [phone]

## 2022-09-04 ENCOUNTER — Other Ambulatory Visit: Payer: Self-pay | Admitting: Internal Medicine

## 2022-09-04 DIAGNOSIS — R7303 Prediabetes: Secondary | ICD-10-CM

## 2022-09-04 DIAGNOSIS — I10 Essential (primary) hypertension: Secondary | ICD-10-CM

## 2022-09-04 DIAGNOSIS — Z7901 Long term (current) use of anticoagulants: Secondary | ICD-10-CM

## 2022-09-04 DIAGNOSIS — Z8673 Personal history of transient ischemic attack (TIA), and cerebral infarction without residual deficits: Secondary | ICD-10-CM

## 2022-09-04 DIAGNOSIS — D6851 Activated protein C resistance: Secondary | ICD-10-CM

## 2022-09-04 DIAGNOSIS — Z1211 Encounter for screening for malignant neoplasm of colon: Secondary | ICD-10-CM

## 2022-09-04 DIAGNOSIS — N1831 Chronic kidney disease, stage 3a: Secondary | ICD-10-CM

## 2022-09-08 ENCOUNTER — Ambulatory Visit (HOSPITAL_COMMUNITY): Payer: Medicare Other | Admitting: Physical Therapy

## 2022-09-08 DIAGNOSIS — M6281 Muscle weakness (generalized): Secondary | ICD-10-CM

## 2022-09-08 DIAGNOSIS — R262 Difficulty in walking, not elsewhere classified: Secondary | ICD-10-CM

## 2022-09-08 DIAGNOSIS — R29898 Other symptoms and signs involving the musculoskeletal system: Secondary | ICD-10-CM

## 2022-09-08 DIAGNOSIS — R2689 Other abnormalities of gait and mobility: Secondary | ICD-10-CM | POA: Diagnosis not present

## 2022-09-08 NOTE — Therapy (Signed)
OUTPATIENT PHYSICAL THERAPY NEURO Treatment   Patient Name: Matthew Wise MRN: [id number] DOB:1961-05-03, 61 y.o., male Today's Date: 09/08/2022   PCP: Lindell Spar, MD  REFERRING PROVIDER: Lindell Spar, MD    PT End of Session - 09/08/22 1525     Visit Number 9    Number of Visits 16    Date for PT Re-Evaluation 09/22/22    Authorization Type [employer] Medicare    Progress Note Due on Visit 10    PT Start Time 1525    PT Stop Time 1558    PT Time Calculation (min) 33 min    Equipment Utilized During Treatment Gait belt    Activity Tolerance Patient tolerated treatment well;Patient limited by fatigue    Behavior During Therapy Flat affect               Past Medical History:  Diagnosis Date   Acute CVA (cerebrovascular accident) ([place]) 09/09/2020   Chronic anticoagulation 11/14/2014   CKD (chronic kidney disease) stage 3, GFR 30-59 ml/min ([place]) 10/24/2019   CKD (chronic kidney disease) stage 3, GFR 30-59 ml/min ([place]) 10/24/2019   Clotting disorder ([place])    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 two simple tubular adenomas removed. patient was advised to go to [facility] for colonoscopy but he did not keep appt.   IR CT HEAD LTD  02/02/2021   IR PERCUTANEOUS ART  THROMBECTOMY/INFUSION INTRACRANIAL INC DIAG ANGIO  02/02/2021   RADIOLOGY WITH ANESTHESIA N/A 02/02/2021   Procedure: IR WITH ANESTHESIA;  Surgeon: Luanne Bras, MD;  Location: [facility];  Service: Radiology;  Laterality: N/A;   TEE WITHOUT CARDIOVERSION N/A 09/10/2020   Procedure: TRANSESOPHAGEAL ECHOCARDIOGRAM (TEE) WITH PROPOFOL;  Surgeon: Satira Sark, MD;  Location: [facility];  Service: Cardiovascular;  Laterality: N/A;   Patient Active Problem List   Diagnosis Date Noted   Encounter for general adult medical examination with abnormal findings 07/06/2022   Stage 3a chronic kidney disease ([place]) 07/06/2022   Hospital discharge follow-up 04/30/2022   Obesity, Class I, BMI 30-34.9 04/25/2022   Generalized weakness    Near syncope    Non-seasonal allergic rhinitis 03/25/2022   Encounter for examination following treatment at hospital 02/27/2022   BPH (benign prostatic hyperplasia) 09/24/2021   History of CVA with residual deficit 09/24/2021   Dysphagia, post-stroke    Spastic hemiplegia affecting nondominant side (HCC)    Hemiparesis affecting left side as late effect of stroke ([place])    Thyroid nodule 01/25/2021   Hypothyroidism 01/25/2021   H/O adenomatous polyp of colon 12/30/2020   Essential hypertension 11/28/2020   Mixed hyperlipidemia 11/28/2020   H/O medication noncompliance 09/07/2020   Nicotine dependence 12/05/2019   Prediabetes 10/24/2019   Tubular adenoma of colon 09/14/2017   Long term (current) use  of anticoagulants 11/14/2014   Sickle cell trait ([place]) 08/29/2012   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    ONSET DATE: 2022  REFERRING DIAG: T46.568 (ICD-10-CM) - 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 Ongoing LT hemiparesis  Pt accompanied by: self  PERTINENT HISTORY: hx CVA 2020, Left hemiparesis.  PAIN:  Are you having pain? No  PRECAUTIONS: Fall  WEIGHT BEARING RESTRICTIONS No  FALLS: Has patient fallen in last 6 months? No  LIVING ENVIRONMENT: Lives with: lives with their family Lives in: House/apartment Stairs: No Has following equipment at home: Programmer, multimedia, Environmental consultant - 2 wheeled, Wheelchair (manual), and Ramped entry  PLOF: Requires assistive device for independence, Needs assistance with ADLs, and Needs assistance with homemaking  PATIENT GOALS Improve strength, walk better, reduce reliance on sister for ADLs.  OBJECTIVE:   COGNITION: Overall cognitive status: Within functional limits for tasks assessed   SENSATION: Light touch: Impaired  LLE  COORDINATION: Impaired gross and find motor coordination of LLE.      LOWER EXTREMITY MMT:  8/31- tested against gravity supine, sidelying and prone   MMT Right Eval 07/30/22 Left Eval 07/30/22  Hip flexion 4- 2+ 3- 2  Hip extension  3-  2  Hip abduction 4 3 3+ 2  Hip adduction      Hip internal rotation      Hip external rotation      Knee flexion  3+  1  Knee extension 4  4-   Ankle dorsiflexion 4+  2   Ankle plantarflexion      Ankle inversion      Ankle eversion      (Blank rows = not tested)    TRANSFERS: Assistive device utilized: Programmer, multimedia  Sit to stand: Modified independence Stand to sit: Modified independence Chair to chair: Modified independence  RAMP:  Level of Assistance: Modified independence Assistive device utilized: Quad cane  large base Ramp Comments: Reports no assist needed on ramp     GAIT: Gait pattern: step to pattern, decreased arm swing- Left, decreased stance time- Left, decreased stride length, decreased hip/knee flexion- Left, decreased ankle dorsiflexion- Left, and wide BOS Distance walked: 30 Assistive device utilized: Quad cane large base Level of assistance: Modified independence Comments: Hemiparetic gait, slow, no evidence of buckling, significant drop foot on Lt, lacks heel  strike  FUNCTIONAL TESTs:  5 times sit to stand: 21 single hand 2 minute walk test: 30 feet with [facility]    TODAY'S TREATMENT:  09/08/22 Sit to stand x10 HHA x 1 Step taps 4 inch 3# x10 LLE HHA x 1 Step ups 4 inch 3#  x10 RLE HHA x 1  Clinic ambulation 100 feet with RW and WC follow (7 min 30 sec)   09/03/22 Sit to stand x10 HHA x 1 Step taps 4 inch 2# x15 LLE HHA x 1 Step ups 4 inch 2#  x15 RLE HHA x 1 Sidestepping at counter x 6 RT Heel raise x10  Clinic ambulation 110 feet with RW and WC follow    09/01/22 Seated Hamstring curl BTB 10 x 5" LLE kick to therapist hand (seated) x20  Seated medial/ lateral obstacle step over (used 4lb dumbbell) 2 x 10 Mini squat with HHA x 1 10 reps    Standing LLE heel tap 6 inch x15 Clinic amb with quad cane 100 feet    PATIENT EDUCATION: Education details: Findings, POC, HEP Person educated: Patient Education method: Explanation Education comprehension: verbalized understanding   HOME EXERCISE PROGRAM:   Access Code: [email or web address] URL: [email or web address] Date: 07/28/2022 Prepared by: Josue Hector  Exercises - Proper Sit to Stand Technique  - 2 x daily - 7 x weekly - 3 sets - 5 reps - Standing March with Counter Support  - 2 x daily - 7 x weekly - 3 sets - 10 reps   08/18/22: Bridge with belt to reduce ER   GOALS: Goals reviewed with patient? Yes  SHORT TERM GOALS: Target date: 08/25/2022  Patient will be independent with initial HEP  and self-management strategies to improve functional outcomes Baseline: Initiated Goal status: IN PROGRESS   2. Patient will demonstrate ability to ambulate 100 feet at Mod I level with LRAD without seated rest break.   Baseline: 30 feet  Goal Status In Progress   LONG TERM GOALS: Target date: 09/22/2022  Patient will be independent with advanced HEP and self-management strategies to improve functional outcomes Baseline: Initiated Goal status: IN PROGRESS  2.  Patient will improve 5 time sit to stand <15 seconds to predicted value to indicate improvement in functional outcomes Baseline: 21 seconds with single hand Goal status: IN PROGRESS   3. Patient will have equal to or > 4+/5 MMT throughout BIL LEs to improve ability to perform functional mobility, stair ambulation and ADLs.  Baseline: See note Goal status: IN PROGRESS  4. Patient will be able to ambulate at least 45 feet during 2MWT with LRAD to demonstrate improved ability to perform functional mobility and associated tasks. Baseline: 30 feet with WBQC Goal status: IN PROGRESS  5. Patient will demonstrate ability to safely navigate 5 steps with single rail and LRAD at Mod I level for meeting transport services in front of his house.  Baseline: Patient has not attempted  Goal Status:in Progress    ASSESSMENT:  CLINICAL IMPRESSION:   Ongoing limitations due to LT sided weakness. Requires continued cues for LLE ground clearance and foot placement. Continued cueing needed for improved gait speed. Timed clinic ambulation today as noted above. Will continue to try and work on improved gait speed and gait mechanics as patient tolerates. Patient will continue to benefit from skilled therapy services to reduce remaining deficits and improve functional ability.   OBJECTIVE IMPAIRMENTS Abnormal gait, decreased activity tolerance, decreased balance, decreased coordination, decreased endurance, decreased knowledge of use of DME,  decreased mobility, difficulty walking, decreased ROM, decreased strength, decreased safety awareness, and impaired sensation.   ACTIVITY LIMITATIONS carrying, lifting, stairs, transfers, bed mobility, and bathing  PARTICIPATION LIMITATIONS: meal prep, cleaning, laundry, and community activity  PERSONAL FACTORS Time since onset of injury/illness/exacerbation are also affecting patient's functional outcome.   REHAB POTENTIAL: Fair Chronic hemiparesis  CLINICAL DECISION MAKING: Stable/uncomplicated  EVALUATION COMPLEXITY: Low  PLAN: PT FREQUENCY: 2x/week  PT DURATION: 8 weeks  PLANNED INTERVENTIONS: Therapeutic exercises, Therapeutic activity, Neuromuscular re-education, Balance training, Gait training, Patient/Family education, Self Care, Joint mobilization, Stair training, and Wheelchair mobility training  PLAN FOR NEXT SESSION:  Gait training with [facility]. Gross strengthening. Progress with stair training as able. Gait speed.  3:25 PM, 09/08/22 Josue Hector PT DPT   Physical Therapist with [facility]  [phone]

## 2022-09-10 ENCOUNTER — Encounter (HOSPITAL_COMMUNITY): Payer: Self-pay | Admitting: Physical Therapy

## 2022-09-10 ENCOUNTER — Ambulatory Visit (HOSPITAL_COMMUNITY): Payer: Medicare Other | Admitting: Physical Therapy

## 2022-09-10 DIAGNOSIS — R2689 Other abnormalities of gait and mobility: Secondary | ICD-10-CM

## 2022-09-10 DIAGNOSIS — M6281 Muscle weakness (generalized): Secondary | ICD-10-CM

## 2022-09-10 DIAGNOSIS — R29898 Other symptoms and signs involving the musculoskeletal system: Secondary | ICD-10-CM | POA: Diagnosis not present

## 2022-09-10 DIAGNOSIS — R262 Difficulty in walking, not elsewhere classified: Secondary | ICD-10-CM

## 2022-09-10 NOTE — Therapy (Signed)
OUTPATIENT PHYSICAL THERAPY NEURO Treatment   Patient Name: Matthew Wise MRN: [id number] DOB:11-10-1961, 61 y.o., male Today's Date: 09/10/2022  Progress Note Reporting Period 07/28/22 to 09/10/22  See note below for Objective Data and Assessment of Progress/Goals.     PCP: Lindell Spar, MD  REFERRING PROVIDER: Lindell Spar, MD    PT End of Session - 09/10/22 1522     Visit Number 10    Number of Visits 16    Date for PT Re-Evaluation 09/22/22    Authorization Type [employer] Medicare    Progress Note Due on Visit 10    PT Start Time 0318    PT Stop Time 0358    PT Time Calculation (min) 40 min    Equipment Utilized During Treatment Gait belt    Activity Tolerance Patient tolerated treatment well;Patient limited by fatigue    Behavior During Therapy Flat affect               Past Medical History:  Diagnosis Date   Acute CVA (cerebrovascular accident) ([place]) 09/09/2020   Chronic anticoagulation 11/14/2014   CKD (chronic kidney disease) stage 3, GFR 30-59 ml/min (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 two simple tubular adenomas removed. patient  was advised to go to [facility] for colonoscopy but he did not keep appt.   IR CT HEAD LTD  02/02/2021   IR PERCUTANEOUS ART THROMBECTOMY/INFUSION INTRACRANIAL INC DIAG ANGIO  02/02/2021   RADIOLOGY WITH ANESTHESIA N/A 02/02/2021   Procedure: IR WITH ANESTHESIA;  Surgeon: Luanne Bras, MD;  Location: [place];  Service: Radiology;  Laterality: N/A;   TEE WITHOUT CARDIOVERSION N/A 09/10/2020   Procedure: TRANSESOPHAGEAL ECHOCARDIOGRAM (TEE) WITH PROPOFOL;  Surgeon: Satira Sark, MD;  Location: [facility];  Service: Cardiovascular;  Laterality: N/A;   Patient Active Problem List   Diagnosis Date Noted   Encounter for general adult medical examination with abnormal findings 07/06/2022   Stage 3a chronic kidney disease ([place]) 07/06/2022   Hospital discharge follow-up 04/30/2022   Obesity, Class I, BMI 30-34.9 04/25/2022   Generalized weakness    Near syncope    Non-seasonal allergic rhinitis 03/25/2022   Encounter for examination following treatment at hospital 02/27/2022   BPH (benign prostatic hyperplasia) 09/24/2021   History of CVA with residual deficit 09/24/2021   Dysphagia, post-stroke    Spastic hemiplegia affecting nondominant side (HCC)    Hemiparesis affecting left side as late effect of stroke ([place])    Thyroid nodule 01/25/2021   Hypothyroidism 01/25/2021   H/O adenomatous polyp of colon 12/30/2020   Essential hypertension 11/28/2020   Mixed hyperlipidemia 11/28/2020   H/O medication noncompliance 09/07/2020  Nicotine dependence 12/05/2019   Prediabetes 10/24/2019   Tubular adenoma of colon 09/14/2017   Long term (current) use of anticoagulants 11/14/2014   Sickle cell trait ([place]) 08/29/2012   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 Still weak on LT side limiting function.  Pt accompanied by: self  PERTINENT HISTORY: hx CVA 2020, Left hemiparesis.  PAIN:  Are you having pain? No  PRECAUTIONS: Fall  WEIGHT BEARING RESTRICTIONS No  FALLS: Has patient fallen in last 6 months? No  LIVING ENVIRONMENT: Lives with: lives with their family Lives in: House/apartment Stairs: No Has following equipment at home: Programmer, multimedia, Environmental consultant - 2 wheeled, Wheelchair (manual), and Ramped entry  PLOF: Requires assistive device for independence, Needs assistance with ADLs, and Needs assistance with homemaking  PATIENT GOALS Improve strength, walk better, reduce reliance on sister for ADLs.  OBJECTIVE:   COGNITION: Overall cognitive status: Within functional limits for tasks assessed   SENSATION: Light touch: Impaired  LLE  COORDINATION: Impaired gross and find motor coordination of LLE.    LOWER EXTREMITY MMT:  8/31- tested against gravity supine, sidelying and prone   MMT Right Eval 07/30/22 Left Eval 07/30/22 RT 09/10/22 LT 09/10/22  Hip flexion 4- 2+ 3- _0 Hip extension  3-  2    Hip abduction 4 3 3+ 2    Hip adduction        Hip internal rotation        Hip external rotation        Knee flexion  3+  1 4- 1  Knee extension 4  4-  4+ 4  Ankle dorsiflexion 4+  _1 Ankle plantarflexion        Ankle inversion        Ankle eversion        (Blank rows = not tested)    TRANSFERS: Assistive device utilized: Programmer, multimedia  Sit to stand: Modified independence Stand to sit: Modified independence Chair  to chair: Modified independence  RAMP:  Level of Assistance: Modified independence Assistive device utilized: Quad cane large base Ramp Comments: Reports no assist needed on ramp     GAIT: Gait pattern: step to pattern, decreased arm swing- Left, decreased stance time- Left, decreased stride length, decreased  hip/knee flexion- Left, decreased ankle dorsiflexion- Left, and wide BOS Distance walked: 144 feet Assistive device utilized: Quad cane large base Level of assistance: Modified independence Comments: Hemiparetic gait, slow, no evidence of buckling, significant drop foot on Lt, lacks heel strike  FUNCTIONAL TESTs:  5 times sit to stand: 19.2 sec RUE assist (was 21 single hand)  2 minute walk test: 42 feet with quad cane (was 30 feet with [facility]QC)    TODAY'S TREATMENT:  09/10/22 Reassess  MMT 5 x STS  2 MWT Clinic amb 144 feet with quad cane   Walk test  09/08/22 Sit to stand x10 HHA x 1 Step taps 4 inch 3# x10 LLE HHA x 1 Step ups 4 inch 3#  x10 RLE HHA x 1  Clinic ambulation 100 feet with RW and WC follow (7 min 30 sec)   09/03/22 Sit to stand x10 HHA x 1 Step taps 4 inch 2# x15 LLE HHA x 1 Step ups 4 inch 2#  x15 RLE HHA x 1 Sidestepping at counter x 6 RT Heel raise x10  Clinic ambulation 110 feet with RW and WC follow    09/01/22 Seated Hamstring curl BTB 10 x 5" LLE kick to therapist hand (seated) x20  Seated medial/ lateral obstacle step over (used 4lb dumbbell) 2 x 10 Mini squat with HHA x 1 10 reps    Standing LLE heel tap 6 inch x15 Clinic amb with quad cane 100 feet    PATIENT EDUCATION: Education details: Findings, POC, HEP Person educated: Patient Education method: Explanation Education comprehension: verbalized understanding   HOME EXERCISE PROGRAM:   Access Code: [email or web address] URL: [email or web address] Date: 07/28/2022 Prepared by: Josue Hector  Exercises - Proper Sit to Stand Technique  - 2 x daily - 7 x weekly - 3  sets - 5 reps - Standing March with Counter Support  - 2 x daily - 7 x weekly - 3 sets - 10 reps   08/18/22: Bridge with belt to reduce ER   GOALS: Goals reviewed with patient? Yes  SHORT TERM GOALS: Target date: 08/25/2022  Patient will be independent with initial HEP and self-management strategies to improve functional outcomes Baseline: Initiated Goal status: MET   2. Patient will demonstrate ability to ambulate 100 feet at Mod I level with LRAD without seated rest break.   Baseline: Walked 144 feet with quad cane but severely decreased speed ( >9 minutes)  Goal Status MET  LONG TERM GOALS: Target date: 09/22/2022  Patient will be independent with advanced HEP and self-management strategies to improve functional outcomes Baseline: Initiated Goal status: IN PROGRESS  2.  Patient will improve 5 time sit to stand <15 seconds to predicted value to indicate improvement in functional outcomes Baseline: 19.2 sec with RUE (was 21 seconds with single hand) Goal status: IN PROGRESS  3. Patient will have equal to or > 4+/5 MMT throughout BIL LEs to improve ability to perform functional mobility, stair ambulation and ADLs.  Baseline: See MMT Goal status: Deferred (unrealistic as stated due to long standing LT hemiplegia)   4. Patient will be able to ambulate at least 45 feet during 2MWT with LRAD to demonstrate improved ability to perform functional mobility and associated tasks. Baseline: 42 feet with WBQC Goal status: IN PROGRESS  5. Patient will demonstrate ability to safely navigate 5 steps with single rail and LRAD at Mod I level for meeting transport services in front of his house.  Baseline: Can do 4 inch step isolated in  parallel bars with HHA x 1, has not tried stair case   Goal Status:in Progress    ASSESSMENT:  CLINICAL IMPRESSION:   Patient showing slow progress to therapy goals. Slight improvement in MMT and gait, though significant weakness in LLE due to history of  hemiplegia related to stroke. He continues to demo poor control of LLE and shows significantly reduced gait speed. He does have good safety awareness and is cautious about gait and balance. Patient will continue to benefit form 2 more weeks of therapy to keep working on LLE coordination and improved gait speed for improved functional mobility and reduced risk for falls.   OBJECTIVE IMPAIRMENTS Abnormal gait, decreased activity tolerance, decreased balance, decreased coordination, decreased endurance, decreased knowledge of use of DME, decreased mobility, difficulty walking, decreased ROM, decreased strength, decreased safety awareness, and impaired sensation.   ACTIVITY LIMITATIONS carrying, lifting, stairs, transfers, bed mobility, and bathing  PARTICIPATION LIMITATIONS: meal prep, cleaning, laundry, and community activity  PERSONAL FACTORS Time since onset of injury/illness/exacerbation are also affecting patient's functional outcome.   REHAB POTENTIAL: Fair Chronic hemiparesis  CLINICAL DECISION MAKING: Stable/uncomplicated  EVALUATION COMPLEXITY: Low  PLAN: PT FREQUENCY: 2x/week  PT DURATION: 8 weeks  PLANNED INTERVENTIONS: Therapeutic exercises, Therapeutic activity, Neuromuscular re-education, Balance training, Gait training, Patient/Family education, Self Care, Joint mobilization, Stair training, and Wheelchair mobility training  PLAN FOR NEXT SESSION:  Gait training with [facility]. Gross strengthening. Progress with stair training as able. Gait speed.  3:22 PM, 09/10/22 Josue Hector PT DPT   Physical Therapist with [facility]  [phone]

## 2022-09-15 ENCOUNTER — Ambulatory Visit (HOSPITAL_COMMUNITY): Payer: Medicare Other | Admitting: Physical Therapy

## 2022-09-17 ENCOUNTER — Ambulatory Visit (HOSPITAL_COMMUNITY): Payer: Medicare Other | Admitting: Physical Therapy

## 2022-09-17 DIAGNOSIS — R2689 Other abnormalities of gait and mobility: Secondary | ICD-10-CM

## 2022-09-17 DIAGNOSIS — M6281 Muscle weakness (generalized): Secondary | ICD-10-CM | POA: Diagnosis not present

## 2022-09-17 DIAGNOSIS — R262 Difficulty in walking, not elsewhere classified: Secondary | ICD-10-CM

## 2022-09-17 DIAGNOSIS — R29898 Other symptoms and signs involving the musculoskeletal system: Secondary | ICD-10-CM | POA: Diagnosis not present

## 2022-09-17 NOTE — Therapy (Signed)
OUTPATIENT PHYSICAL THERAPY NEURO Treatment   Patient Name: Matthew Wise MRN: [id number] DOB:01-Apr-1961, 61 y.o., male Today's Date: 09/17/2022    PCP: Lindell Spar, MD  REFERRING PROVIDER: Lindell Spar, MD    PT End of Session - 09/17/22 1533     Visit Number 11    Number of Visits 16    Date for PT Re-Evaluation 09/22/22    Authorization Type [employer] Medicare    Progress Note Due on Visit 10    PT Start Time 1532    PT Stop Time 1556    PT Time Calculation (min) 24 min    Equipment Utilized During Treatment Gait belt    Activity Tolerance Patient tolerated treatment well;Patient limited by fatigue    Behavior During Therapy Flat affect               Past Medical History:  Diagnosis Date   Acute CVA (cerebrovascular accident) ([place]) 09/09/2020   Chronic anticoagulation 11/14/2014   CKD (chronic kidney disease) stage 3, GFR 30-59 ml/min ([place]) 10/24/2019   CKD (chronic kidney disease) stage 3, GFR 30-59 ml/min ([place]) 10/24/2019   Clotting disorder ([place])    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 two simple tubular adenomas removed. patient was advised to go to [facility] for colonoscopy but he did not keep appt.   IR CT HEAD LTD  02/02/2021   IR PERCUTANEOUS ART  THROMBECTOMY/INFUSION INTRACRANIAL INC DIAG ANGIO  02/02/2021   RADIOLOGY WITH ANESTHESIA N/A 02/02/2021   Procedure: IR WITH ANESTHESIA;  Surgeon: Luanne Bras, MD;  Location: [place];  Service: Radiology;  Laterality: N/A;   TEE WITHOUT CARDIOVERSION N/A 09/10/2020   Procedure: TRANSESOPHAGEAL ECHOCARDIOGRAM (TEE) WITH PROPOFOL;  Surgeon: Satira Sark, MD;  Location: [facility];  Service: Cardiovascular;  Laterality: N/A;   Patient Active Problem List   Diagnosis Date Noted   Encounter for general adult medical examination with abnormal findings 07/06/2022   Stage 3a chronic kidney disease ([place]) 07/06/2022   Hospital discharge follow-up 04/30/2022   Obesity, Class I, BMI 30-34.9 04/25/2022   Generalized weakness    Near syncope    Non-seasonal allergic rhinitis 03/25/2022   Encounter for examination following treatment at hospital 02/27/2022   BPH (benign prostatic hyperplasia) 09/24/2021   History of CVA with residual deficit 09/24/2021   Dysphagia, post-stroke    Spastic hemiplegia affecting nondominant side (HCC)    Hemiparesis affecting left side as late effect of stroke ([place])    Thyroid nodule 01/25/2021   Hypothyroidism 01/25/2021   H/O adenomatous polyp of colon 12/30/2020   Essential hypertension 11/28/2020   Mixed hyperlipidemia 11/28/2020   H/O medication noncompliance 09/07/2020   Nicotine dependence 12/05/2019   Prediabetes 10/24/2019   Tubular adenoma of colon 09/14/2017   Long term (current)  use of anticoagulants 11/14/2014   Sickle cell trait ([place]) 08/29/2012   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    ONSET DATE: 2022  REFERRING DIAG: K24.097 (ICD-10-CM) - Hemiparesis affecting left side as late effect of stroke (HCC) G81.10 (ICD-10-CM) - Spastic hemiplegia affecting nondominant side (HCC)   THERAPY DIAG:  Other abnormalities of gait and mobility  Muscle weakness (generalized)  Difficulty  in walking, not elsewhere classified  Rationale for Evaluation and Treatment Rehabilitation  SUBJECTIVE:                                                                                                                                   SUBJECTIVE STATEMENT:  He feels he is better with walking at home and outside of therapy.   Pt accompanied by: self  PERTINENT HISTORY: hx CVA 2020, Left hemiparesis.  PAIN:  Are you having pain? No  PRECAUTIONS: Fall  WEIGHT BEARING RESTRICTIONS No  FALLS: Has patient fallen in last 6 months? No  LIVING ENVIRONMENT: Lives with: lives with their family Lives in: House/apartment Stairs: No Has following equipment at home: Programmer, multimedia, Environmental consultant - 2 wheeled, Wheelchair (manual), and Ramped entry  PLOF: Requires assistive device for independence, Needs assistance with ADLs, and Needs assistance with homemaking  PATIENT GOALS Improve strength, walk better, reduce reliance on sister for ADLs.  OBJECTIVE:   COGNITION: Overall cognitive status: Within functional limits for tasks assessed   SENSATION: Light touch: Impaired  LLE  COORDINATION: Impaired gross and find motor coordination of LLE.    LOWER EXTREMITY MMT:  8/31- tested against gravity supine, sidelying and prone   MMT Right Eval 07/30/22 Left Eval 07/30/22 RT 09/10/22 LT 09/10/22  Hip flexion 4- 2+ 3- _0 Hip extension  3-  2    Hip abduction 4 3 3+ 2    Hip adduction        Hip internal rotation        Hip external rotation        Knee flexion  3+  1 4- 1  Knee extension 4  4-  4+ 4  Ankle dorsiflexion 4+  _1 Ankle plantarflexion        Ankle inversion        Ankle eversion        (Blank rows = not tested)    TRANSFERS: Assistive device utilized: Programmer, multimedia  Sit to stand: Modified independence Stand to sit: Modified independence Chair to chair: Modified independence  RAMP:  Level of Assistance: Modified independence Assistive device  utilized: Quad cane large base Ramp Comments: Reports no assist needed on ramp     GAIT: Gait pattern: step to pattern, decreased arm swing- Left, decreased stance time- Left, decreased stride length, decreased hip/knee flexion- Left, decreased ankle dorsiflexion- Left, and wide BOS Distance walked: 144 feet  Assistive device utilized: Quad cane large base Level of assistance: Modified independence Comments: Hemiparetic gait, slow, no evidence of buckling, significant drop foot on Lt, lacks heel strike  FUNCTIONAL TESTs:  5 times sit to stand: 19.2 sec RUE assist (was 21 single hand)  2 minute walk test: 42 feet with quad cane (was 30 feet with [facility])    TODAY'S TREATMENT:  09/17/22 Gait training 80 feet with quad cane Stair training 5 standard steps (7 inch) using RT hand rail   09/10/22 Reassess  MMT 5 x STS  2 MWT Clinic amb 144 feet with quad cane   Walk test  09/08/22 Sit to stand x10 HHA x 1 Step taps 4 inch 3# x10 LLE HHA x 1 Step ups 4 inch 3#  x10 RLE HHA x 1  Clinic ambulation 100 feet with RW and WC follow (7 min 30 sec)   09/03/22 Sit to stand x10 HHA x 1 Step taps 4 inch 2# x15 LLE HHA x 1 Step ups 4 inch 2#  x15 RLE HHA x 1 Sidestepping at counter x 6 RT Heel raise x10  Clinic ambulation 110 feet with RW and WC follow    09/01/22 Seated Hamstring curl BTB 10 x 5" LLE kick to therapist hand (seated) x20  Seated medial/ lateral obstacle step over (used 4lb dumbbell) 2 x 10 Mini squat with HHA x 1 10 reps    Standing LLE heel tap 6 inch x15 Clinic amb with quad cane 100 feet    PATIENT EDUCATION: Education details: Findings, POC, HEP Person educated: Patient Education method: Explanation Education comprehension: verbalized understanding   HOME EXERCISE PROGRAM:   Access Code: [email or web address] URL: [email or web address] Date: 07/28/2022 Prepared by: Josue Hector  Exercises - Proper Sit to Stand Technique  - 2 x daily - 7 x  weekly - 3 sets - 5 reps - Standing March with Counter Support  - 2 x daily - 7 x weekly - 3 sets - 10 reps   08/18/22: Bridge with belt to reduce ER   GOALS: Goals reviewed with patient? Yes  SHORT TERM GOALS: Target date: 08/25/2022  Patient will be independent with initial HEP and self-management strategies to improve functional outcomes Baseline: Initiated Goal status: MET   2. Patient will demonstrate ability to ambulate 100 feet at Mod I level with LRAD without seated rest break.   Baseline: Walked 144 feet with quad cane but severely decreased speed ( >9 minutes)  Goal Status MET  LONG TERM GOALS: Target date: 09/22/2022  Patient will be independent with advanced HEP and self-management strategies to improve functional outcomes Baseline: Initiated Goal status: IN PROGRESS  2.  Patient will improve 5 time sit to stand <15 seconds to predicted value to indicate improvement in functional outcomes Baseline: 19.2 sec with RUE (was 21 seconds with single hand) Goal status: IN PROGRESS  3. Patient will have equal to or > 4+/5 MMT throughout BIL LEs to improve ability to perform functional mobility, stair ambulation and ADLs.  Baseline: See MMT Goal status: Deferred (unrealistic as stated due to long standing LT hemiplegia)   4. Patient will be able to ambulate at least 45 feet during 2MWT with LRAD to demonstrate improved ability to perform functional mobility and associated tasks. Baseline: 42 feet with WBQC Goal status: IN PROGRESS  5. Patient will demonstrate ability to safely navigate 5 steps with single rail and LRAD at Mod I level for meeting transport services in front of his house.  Baseline:  Can do 4 inch step isolated in parallel bars with HHA x 1, has not tried stair case   Goal Status:in Progress    ASSESSMENT:  CLINICAL IMPRESSION:   Session limited by late arrival. Continued work on gait and stair mechanics. Patient did well ambulating standard height steps  but required verbal cues for foot placement when turning to descend stairs. Overall, good safety awareness. Patient will continue to benefit from skilled therapy services to reduce remaining deficits and improve functional ability.    OBJECTIVE IMPAIRMENTS Abnormal gait, decreased activity tolerance, decreased balance, decreased coordination, decreased endurance, decreased knowledge of use of DME, decreased mobility, difficulty walking, decreased ROM, decreased strength, decreased safety awareness, and impaired sensation.   ACTIVITY LIMITATIONS carrying, lifting, stairs, transfers, bed mobility, and bathing  PARTICIPATION LIMITATIONS: meal prep, cleaning, laundry, and community activity  PERSONAL FACTORS Time since onset of injury/illness/exacerbation are also affecting patient's functional outcome.   REHAB POTENTIAL: Fair Chronic hemiparesis  CLINICAL DECISION MAKING: Stable/uncomplicated  EVALUATION COMPLEXITY: Low  PLAN: PT FREQUENCY: 2x/week  PT DURATION: 8 weeks  PLANNED INTERVENTIONS: Therapeutic exercises, Therapeutic activity, Neuromuscular re-education, Balance training, Gait training, Patient/Family education, Self Care, Joint mobilization, Stair training, and Wheelchair mobility training  PLAN FOR NEXT SESSION:  Gait training with [facility]. Gross strengthening. Progress with stair training as able. Gait speed.  3:34 PM, 09/17/22 Josue Hector PT DPT   Physical Therapist with [facility]  [phone]

## 2022-09-21 ENCOUNTER — Other Ambulatory Visit: Payer: Self-pay | Admitting: Internal Medicine

## 2022-09-21 DIAGNOSIS — N1831 Chronic kidney disease, stage 3a: Secondary | ICD-10-CM

## 2022-09-21 DIAGNOSIS — I10 Essential (primary) hypertension: Secondary | ICD-10-CM

## 2022-09-27 ENCOUNTER — Other Ambulatory Visit: Payer: Self-pay | Admitting: Internal Medicine

## 2022-09-27 DIAGNOSIS — I10 Essential (primary) hypertension: Secondary | ICD-10-CM

## 2022-09-27 DIAGNOSIS — N1831 Chronic kidney disease, stage 3a: Secondary | ICD-10-CM

## 2022-10-15 ENCOUNTER — Ambulatory Visit (INDEPENDENT_AMBULATORY_CARE_PROVIDER_SITE_OTHER): Payer: Medicare Other

## 2022-10-15 ENCOUNTER — Ambulatory Visit: Payer: Medicare Other | Admitting: Family Medicine

## 2022-10-15 DIAGNOSIS — Z0000 Encounter for general adult medical examination without abnormal findings: Secondary | ICD-10-CM

## 2022-10-15 NOTE — Progress Notes (Signed)
Subjective:   Matthew Wise is a 61 y.o. male who presents for Medicare Annual/Subsequent preventive examination. I connected with  Matthew Wise on 10/15/22 by a audio enabled telemedicine application and verified that I am speaking with the correct person using two identifiers.  Patient Location: Home  Provider Location: Office/Clinic  I discussed the limitations of evaluation and management by telemedicine. The patient expressed understanding and agreed to proceed.  Review of Systems           Objective:    There were no vitals filed for this visit. There is no height or weight on file to calculate BMI.     04/24/2022    8:00 PM 04/24/2022    1:09 PM 02/19/2022   11:10 AM 10/01/2021    3:47 PM 05/14/2021   11:19 AM 02/12/2021    2:27 PM 02/02/2021    7:59 PM  Advanced Directives  Does Patient Have a Medical Advance Directive? No No No No No No No  Does patient want to make changes to medical advance directive?    No - Patient declined     Would patient like information on creating a medical advance directive? No - Patient declined No - Patient declined  No - Patient declined No - Patient declined No - Patient declined No - Patient declined    Current Medications (verified) Outpatient Encounter Medications as of 10/15/2022  Medication Sig   acetaminophen (TYLENOL) 325 MG tablet Take 2 tablets (650 mg total) by mouth every 4 (four) hours as needed for mild pain (or temp > 37.5 C (99.5 F)).   amLODipine (NORVASC) 5 MG tablet Take 1 tablet by mouth once daily   aspirin EC 81 MG EC tablet Take 1 tablet (81 mg total) by mouth daily. Swallow whole.   atorvastatin (LIPITOR) 40 MG tablet Take 1 tablet by mouth once daily   cetirizine (ZYRTEC) 10 MG tablet Take 1 tablet (10 mg total) by mouth daily.   levETIRAcetam (KEPPRA) 500 MG tablet Take 1 tablet (500 mg total) by mouth 2 (two) times daily.   levothyroxine (SYNTHROID) 25 MCG tablet Take 1 tablet by mouth once daily    lisinopril (ZESTRIL) 2.5 MG tablet Take 1 tablet by mouth once daily   pantoprazole (PROTONIX) 40 MG tablet TAKE 1 TABLET BY MOUTH EVERYDAY AT BEDTIME (Patient taking differently: Take 40 mg by mouth at bedtime.)   pantoprazole (PROTONIX) 40 MG tablet Take 1 tablet (40 mg total) by mouth daily.   PRADAXA 150 MG CAPS capsule TAKE 1 CAPSULE BY MOUTH EVERY 12 HOURS   senna-docusate (SENOKOT-S) 8.6-50 MG tablet Take 1 tablet by mouth at bedtime.   tamsulosin (FLOMAX) 0.4 MG CAPS capsule TAKE 1 CAPSULE BY MOUTH ONCE DAILY AFTER SUPPER   No facility-administered encounter medications on file as of 10/15/2022.    Allergies (verified) Patient has no known allergies.   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 two simple tubular adenomas removed. patient was advised to go to [facility] for colonoscopy but he did not keep appt.   IR CT HEAD LTD  02/02/2021   IR PERCUTANEOUS ART  THROMBECTOMY/INFUSION INTRACRANIAL INC DIAG ANGIO  02/02/2021   RADIOLOGY WITH ANESTHESIA N/A 02/02/2021   Procedure: IR WITH ANESTHESIA;  Surgeon: Luanne Bras, MD;  Location: [facility];  Service: Radiology;  Laterality: N/A;   TEE WITHOUT CARDIOVERSION N/A 09/10/2020   Procedure: TRANSESOPHAGEAL ECHOCARDIOGRAM (TEE) WITH PROPOFOL;  Surgeon: Satira Sark, MD;  Location: [facility];  Service: Cardiovascular;  Laterality: N/A;   Family History  Problem Relation Age of Onset   COPD Father    Stroke Brother    Pulmonary embolism Sister    Throat cancer Brother    Arthritis Mother    Diabetes Mother    Stroke Mother    Hypertension Brother    Diabetes Brother    Arthritis Brother    Diabetes Sister    Colon cancer Neg Hx    Social History   Socioeconomic History   Marital status: Divorced    Spouse name: Not on file   Number of children: 2   Years of education: 12   Highest education level: Not on file  Occupational History   Occupation: disability    Comment: DVT  Tobacco Use   Smoking status: Former    Packs/day: 1.00    Years: 30.00    Total pack years: 30.00    Types: Cigarettes    Start date: 12/01/1983   Smokeless tobacco: Never  Vaping Use   Vaping Use: Never used  Substance and Sexual Activity   Alcohol use: Not Currently    Alcohol/week: 3.0 standard drinks of alcohol    Types: 3 Cans of beer per week    Comment: has not drank sice 11/27/20   Drug use: Not Currently    Comment: REMOTE HISTORY, Quit in 2007   Sexual activity: Yes  Other Topics Concern   Not on file  Social History Narrative   Lives with sister   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   Right Handed    Drinks caffeine on occassion   Social Determinants of Health   Financial Resource Strain: Low Risk  (10/01/2021)   Overall Financial Resource Strain (CARDIA)    Difficulty of Paying Living Expenses: Not hard at all  Food Insecurity: No Food Insecurity (10/01/2021)   Hunger Vital Sign     Worried About Running Out of Food in the Last Year: Never true    [place] in the Last Year: Never true  Transportation Needs: No Transportation Needs (10/01/2021)   PRAPARE - Hydrologist (Medical): No    Lack of Transportation (Non-Medical): No  Physical Activity: Insufficiently Active (10/01/2021)   Exercise Vital Sign    Days of Exercise per Week: 5 days    Minutes of Exercise per Session: 20 min  Stress: No Stress Concern Present (10/01/2021)   [employer] - Occupational Stress Questionnaire    Feeling of Stress : Only a little  Social Connections: Moderately Isolated (10/01/2021)   Social Connection and Isolation Panel [NHANES]    Frequency of Communication with Friends and Family: More than three times a week    Frequency of Social Gatherings with Friends and Family: More than three times a week    Attends Religious Services: 1 to 4 times per year    Active Member of [employer] or Organizations: No    Attends Archivist Meetings: Never    Marital Status: Divorced    Tobacco Counseling Counseling given: Not Answered   Clinical Intake:                 Diabetic?no         Activities of Daily Living    04/24/2022    8:00 PM  In your present state of health, do you have any difficulty performing the following activities:  Hearing? 0  Vision? 0  Difficulty concentrating or making decisions? 0  Walking or climbing stairs? 1  Dressing or bathing? 1  Doing errands, shopping? 0    Patient Care Team: Lindell Spar, MD as PCP - General (Internal Medicine) Teena Irani, PTA as Physical Therapy Assistant (Physical Therapy) Danie Binder, MD (Inactive) as Consulting Physician (Gastroenterology) Eloise Harman, DO as Consulting Physician (Internal Medicine)  Indicate any recent Medical Services you may have received from other than [facility] providers in the past year (date may be  approximate).     Assessment:   This is a routine wellness examination for Jaesean.  Hearing/Vision screen No results found.  Dietary issues and exercise activities discussed:     Goals Addressed   None    Depression Screen    07/06/2022   11:05 AM 06/26/2022    2:07 PM 04/30/2022    4:03 PM 03/25/2022   10:39 AM 02/23/2022    8:15 AM 01/05/2022   10:55 AM 10/07/2021   11:15 AM  PHQ 2/9 Scores  PHQ - 2 Score 0 0 0 0 1 0 2    Fall Risk    07/06/2022   11:04 AM 06/26/2022    2:07 PM 04/30/2022    4:03 PM 03/25/2022   10:39 AM 02/23/2022    8:14 AM  Fall Risk   Falls in the past year? 0 0 1 0 1  Number falls in past yr: 0 0 0 0 0  Injury with Fall? 0  0 0 0  Risk for fall due to : No Fall Risks  History of fall(s);Impaired balance/gait No Fall Risks History of fall(s);Impaired balance/gait  Follow up Falls evaluation completed  Falls evaluation completed;Education provided Falls evaluation completed Falls evaluation completed;Falls prevention discussed    FALL RISK PREVENTION PERTAINING TO THE HOME:  Any stairs in or around the home? Yes  If so, are there any without handrails? No  Home free of loose throw rugs in walkways, pet beds, electrical cords, etc? Yes  Adequate lighting in your home to reduce risk of falls? Yes   ASSISTIVE DEVICES UTILIZED TO PREVENT FALLS:  Life alert? No  Use of a cane, walker or w/c? Yes  Grab bars in the bathroom? No  Shower chair or bench in shower? No  Elevated toilet seat or a handicapped toilet? Yes   TIMED UP AND GO:  Was the test performed? No .  Length of time to ambulate 10 feet:  sec.  Cognitive Function:    10/01/2021    3:49 PM  MMSE - Mini Mental State Exam  Not completed: Unable to complete        10/01/2021    3:49 PM 12/05/2019    1:25 PM  6CIT Screen  What Year? 0 points 0 points  What month? 0 points 0 points  What time? 0 points 0 points  Count back from 20 2 points 0 points  Months in reverse 0 points 2  points  Repeat phrase 0 points 0 points  Total Score 2 points 2 points    Immunizations Immunization History  Administered Date(s) Administered   Influenza,inj,Quad PF,6+ Mos 09/14/2017   Moderna Sars-Covid-2 Vaccination 02/29/2020   PFIZER(Purple Top)SARS-COV-2 Vaccination 03/29/2020   Td 11/30/2014    TDAP status: Up to date  Flu Vaccine status: Due, Education has been provided regarding the importance of this vaccine. Advised may receive this vaccine at local pharmacy or Health Dept. Aware to provide a copy of the vaccination record if obtained from local pharmacy or Health Dept. Verbalized acceptance and understanding.    Covid-19 vaccine status: Information provided on how to obtain vaccines.   Qualifies for Shingles Vaccine? Yes   Zostavax completed No   Shingrix Completed?: No.    Education has been provided regarding the importance of this vaccine. Patient has been advised to call insurance company to determine out of pocket expense if they have not yet received this vaccine. Advised may also receive vaccine at local pharmacy or Health Dept. Verbalized acceptance and understanding.  Screening Tests Health Maintenance  Topic Date Due   Zoster Vaccines- Shingrix (1 of 2) Never done   COVID-19 Vaccine (3 - Mixed Product risk series) 04/26/2020   Lung Cancer Screening  12/03/2021   INFLUENZA VACCINE  06/30/2022   Medicare Annual Wellness (AWV)  10/01/2022   TETANUS/TDAP  11/30/2024   COLONOSCOPY (Pts 45-44yr Insurance coverage will need to be confirmed)  10/28/2025   Hepatitis C Screening  Completed   HIV Screening  Completed   HPV VACCINES  Aged Out    Health Maintenance  Health Maintenance Due  Topic Date Due   Zoster Vaccines- Shingrix (1 of 2) Never done   COVID-19 Vaccine (3 - Mixed Product risk series) 04/26/2020   Lung Cancer Screening  12/03/2021   INFLUENZA VACCINE  06/30/2022   Medicare Annual Wellness (AWV)  10/01/2022    Colorectal cancer  screening: Type of screening: Colonoscopy. Completed 10/29/15. Repeat every 10 years  Lung Cancer Screening: (Low Dose CT Chest recommended if Age 60-80years, 30 pack-year currently smoking OR have quit w/in 15years.) does not qualify.   Lung Cancer Screening Referral:   Additional Screening:  Hepatitis C Screening: does not qualify; Completed 09/24/21  Vision Screening: Recommended annual ophthalmology exams for early detection of glaucoma and other disorders of the eye. Is the patient up to date with their annual eye exam?  No  Who is the provider or what is the name of the office in which the patient attends annual eye exams?  If pt is not established with a provider, would they like to be referred to a provider to establish care? No .   Dental Screening: Recommended annual dental exams for proper oral hygiene  Community Resource Referral / Chronic Care Management: CRR required this visit?  No   CCM required this visit?  No      Plan:     I have personally reviewed and noted the following in  the patient's chart:   Medical and social history Use of alcohol, tobacco or illicit drugs  Current medications and supplements including opioid prescriptions. Patient is not currently taking opioid prescriptions. Functional ability and status Nutritional status Physical activity Advanced directives List of other physicians Hospitalizations, surgeries, and ER visits in previous 12 months Vitals Screenings to include cognitive, depression, and falls Referrals and appointments  In addition, I have reviewed and discussed with patient certain preventive protocols, quality metrics, and best practice recommendations. A written personalized care plan for preventive services as well as general preventive health recommendations were provided to patient.     Jill Side, [place]   10/15/2022   Nurse Notes:

## 2022-10-15 NOTE — Patient Instructions (Signed)
  Mr. Knippel , Thank you for taking time to come for your Medicare Wellness Visit. I appreciate your ongoing commitment to your health goals. Please review the following plan we discussed and let me know if I can assist you in the future.   These are the goals we discussed:  Goals      Patient Stated     Take better care of himself.     Patient Stated     Patient states that his goal is get better so that he can take care of himself.     STOP SMOKING     Patient is interested in quitting smoking.  He tells me he will try.  We will start by prescribing him nicotine patches to aid him in his efforts.        This is a list of the screening recommended for you and due dates:  Health Maintenance  Topic Date Due   Zoster (Shingles) Vaccine (1 of 2) Never done   COVID-19 Vaccine (3 - Mixed Product risk series) 04/26/2020   Screening for Lung Cancer  12/03/2021   Flu Shot  06/30/2022   Medicare Annual Wellness Visit  10/16/2023   Tetanus Vaccine  11/30/2024   Colon Cancer Screening  10/28/2025   Hepatitis C Screening: USPSTF Recommendation to screen - Ages 18-79 yo.  Completed   HIV Screening  Completed   HPV Vaccine  Aged Out

## 2022-10-21 ENCOUNTER — Encounter: Payer: Self-pay | Admitting: Family Medicine

## 2022-10-21 ENCOUNTER — Ambulatory Visit (INDEPENDENT_AMBULATORY_CARE_PROVIDER_SITE_OTHER): Payer: Medicare Other | Admitting: Family Medicine

## 2022-10-21 VITALS — BP 126/86 | HR 65 | Ht 72.0 in | Wt 255.1 lb

## 2022-10-21 DIAGNOSIS — R531 Weakness: Secondary | ICD-10-CM | POA: Diagnosis not present

## 2022-10-21 NOTE — Progress Notes (Signed)
Acute Office Visit  Subjective:    Patient ID: Matthew Wise, male    DOB: Dec 11, 1960, 61 y.o.   MRN: [id number]  Chief Complaint  Patient presents with   Extremity Weakness    Pt reports weakness on left side, reports doing better.     HPI Patient is in today with no complaints or concerns.  He reports feeling fatigued 2 weeks ago when he made his appointment but notes that today he feels better with no complaints voiced.  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 two simple tubular adenomas removed. patient was advised to go to [facility] for colonoscopy but he did not keep appt.   IR CT HEAD LTD  02/02/2021   IR PERCUTANEOUS ART THROMBECTOMY/INFUSION INTRACRANIAL INC DIAG ANGIO  02/02/2021   RADIOLOGY WITH ANESTHESIA N/A 02/02/2021   Procedure: IR WITH ANESTHESIA;  Surgeon: Luanne Bras, MD;  Location: [facility];  Service: Radiology;  Laterality: N/A;   TEE WITHOUT CARDIOVERSION N/A  09/10/2020   Procedure: TRANSESOPHAGEAL ECHOCARDIOGRAM (TEE) WITH PROPOFOL;  Surgeon: Satira Sark, MD;  Location: [facility];  Service: Cardiovascular;  Laterality: N/A;    Family History  Problem Relation Age of Onset   COPD Father    Stroke Brother    Pulmonary embolism Sister    Throat cancer Brother    Arthritis Mother    Diabetes Mother    Stroke Mother    Hypertension Brother    Diabetes Brother    Arthritis Brother    Diabetes Sister    Colon cancer Neg Hx     Social History   Socioeconomic History   Marital status: Divorced    Spouse name: Not on file   Number of children: 2   Years of education: 12   Highest education level: Not on file  Occupational History   Occupation: disability    Comment: DVT  Tobacco Use   Smoking status: Former    Packs/day: 1.00    Years: 30.00    Total pack years: 30.00    Types: Cigarettes    Start date: 12/01/1983   Smokeless tobacco: Never  Vaping Use   Vaping Use: Never used  Substance and Sexual Activity   Alcohol use: Not Currently    Alcohol/week: 3.0 standard drinks of alcohol    Types: 3 Cans of beer per week    Comment: has not drank sice 11/27/20   Drug use: Not Currently    Comment: REMOTE HISTORY,  Quit in 2007   Sexual activity: Yes  Other Topics Concern   Not on file  Social History Narrative   Lives with sister   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   Right Handed    Drinks caffeine on occassion   Social Determinants of Health   Financial Resource Strain: Low Risk  (10/15/2022)   Overall Financial Resource Strain (CARDIA)    Difficulty of Paying Living Expenses: Not hard at all  Food Insecurity: No Food Insecurity (10/15/2022)   Hunger Vital Sign    Worried About Running Out of Food in the Last Year: Never true    Ran Out of Food in the Last Year: Never true  Transportation Needs: No Transportation Needs (10/15/2022)   PRAPARE - Hydrologist (Medical): No    Lack of  Transportation (Non-Medical): No  Physical Activity: Insufficiently Active (10/01/2021)   Exercise Vital Sign    Days of Exercise per Week: 5 days    Minutes of Exercise per Session: 20 min  Stress: No Stress Concern Present (10/15/2022)   [place]    Feeling of Stress : Not at all  Social Connections: Moderately Isolated (10/15/2022)   Social Connection and Isolation Panel [NHANES]    Frequency of Communication with Friends and Family: More than three times a week    Frequency of Social Gatherings with Friends and Family: Three times a week    Attends Religious Services: More than 4 times per year    Active Member of Clubs or Organizations: No    Attends Archivist Meetings: Never    Marital Status: Divorced  Human resources officer Violence: Not At Risk (10/01/2021)   Humiliation, Afraid, Rape, and Kick questionnaire    Fear of Current or Ex-Partner: No    Emotionally Abused: No    Physically Abused: No    Sexually Abused: No    Outpatient Medications Prior to Visit  Medication Sig Dispense Refill   acetaminophen (TYLENOL) 325 MG tablet Take 2 tablets (650 mg total) by mouth every 4 (four) hours as needed for mild pain (or temp > 37.5 C (99.5 F)).     amLODipine (NORVASC) 5 MG tablet Take 1 tablet by mouth once daily 90 tablet 0   aspirin EC 81 MG EC tablet Take 1 tablet (81 mg total) by mouth daily. Swallow whole. 30 tablet 11   atorvastatin (LIPITOR) 40 MG tablet Take 1 tablet by mouth once daily 90 tablet 0   cetirizine (ZYRTEC) 10 MG tablet Take 1 tablet (10 mg total) by mouth daily. 30 tablet 11   levETIRAcetam (KEPPRA) 500 MG tablet Take 1 tablet (500 mg total) by mouth 2 (two) times daily. 180 tablet 3   levothyroxine (SYNTHROID) 25 MCG tablet Take 1 tablet by mouth once daily 90 tablet 1   lisinopril (ZESTRIL) 2.5 MG tablet Take 1 tablet by mouth once daily 90 tablet 0   pantoprazole (PROTONIX) 40 MG  tablet TAKE 1 TABLET BY MOUTH EVERYDAY AT BEDTIME (Patient taking differently: Take 40 mg by mouth at bedtime.) 90 tablet 1   pantoprazole (PROTONIX) 40 MG tablet Take 1 tablet (40 mg total) by mouth daily. 30 tablet 3   PRADAXA 150 MG CAPS capsule TAKE 1 CAPSULE BY MOUTH EVERY 12 HOURS 60 capsule 5   senna-docusate (SENOKOT-S) 8.6-50 MG tablet Take 1 tablet by mouth at bedtime.     tamsulosin (FLOMAX) 0.4 MG CAPS capsule  TAKE 1 CAPSULE BY MOUTH ONCE DAILY AFTER SUPPER 90 capsule 0   No facility-administered medications prior to visit.    No Known Allergies  Review of Systems  Constitutional:  Negative for fatigue and fever.  Eyes:  Negative for photophobia and visual disturbance.  Respiratory:  Negative for chest tightness and shortness of breath.   Cardiovascular:  Negative for chest pain and palpitations.  Gastrointestinal:  Negative for diarrhea, nausea and vomiting.       Objective:    Physical Exam HENT:     Head: Normocephalic.     Right Ear: External ear normal.     Left Ear: External ear normal.  Cardiovascular:     Rate and Rhythm: Normal rate and regular rhythm.     Pulses: Normal pulses.     Heart sounds: Normal heart sounds.  Pulmonary:     Breath sounds: Normal breath sounds.  Neurological:     Mental Status: He is alert.     BP 126/86   Pulse 65   Ht 6' (1.829 m)   Wt 255 lb 1.3 oz (115.7 kg)   SpO2 100%   BMI 34.60 kg/m  Wt Readings from Last 3 Encounters:  10/21/22 255 lb 1.3 oz (115.7 kg)  04/30/22 240 lb (108.9 kg)  04/24/22 240 lb (108.9 kg)       Assessment & Plan:  Generalized weakness Assessment & Plan: He reports the resolution of symptoms today, noting that he feels better  He voices no complaints or concerns today     Alvira Monday, FNP

## 2022-10-21 NOTE — Assessment & Plan Note (Signed)
He reports the resolution of symptoms today, noting that he feels better  He voices no complaints or concerns today

## 2022-10-21 NOTE — Patient Instructions (Signed)
I appreciate the opportunity to provide care to you today!    Follow up:  with Dr. Posey Pronto                                HAPPY HOLIDAYS   Please continue to a heart-healthy diet and increase your physical activities. Try to exercise for 18mns at least three times a week.      It was a pleasure to see you and I look forward to continuing to work together on your health and well-being. Please do not hesitate to call the office if you need care or have questions about your care.   Have a wonderful day and week. With Gratitude, GAlvira MondayMSN, FNP-BC

## 2022-10-30 ENCOUNTER — Telehealth: Payer: Self-pay | Admitting: Internal Medicine

## 2022-10-30 ENCOUNTER — Other Ambulatory Visit: Payer: Self-pay | Admitting: Internal Medicine

## 2022-10-30 DIAGNOSIS — I10 Essential (primary) hypertension: Secondary | ICD-10-CM

## 2022-10-30 DIAGNOSIS — D6851 Activated protein C resistance: Secondary | ICD-10-CM

## 2022-10-30 DIAGNOSIS — Z7901 Long term (current) use of anticoagulants: Secondary | ICD-10-CM

## 2022-10-30 DIAGNOSIS — Z8673 Personal history of transient ischemic attack (TIA), and cerebral infarction without residual deficits: Secondary | ICD-10-CM

## 2022-10-30 NOTE — Telephone Encounter (Signed)
Patient needs refill on   PRADAXA 150 MG CAPS capsule   [place] [place] [facility] [address] [place], [place] [address] Phone: [phone]  Fax: [phone]

## 2022-10-30 NOTE — Telephone Encounter (Signed)
Refill sent.

## 2022-11-28 ENCOUNTER — Other Ambulatory Visit: Payer: Self-pay | Admitting: Internal Medicine

## 2022-11-28 DIAGNOSIS — N1831 Chronic kidney disease, stage 3a: Secondary | ICD-10-CM

## 2022-11-28 DIAGNOSIS — Z1211 Encounter for screening for malignant neoplasm of colon: Secondary | ICD-10-CM

## 2022-12-04 ENCOUNTER — Other Ambulatory Visit: Payer: Self-pay | Admitting: Internal Medicine

## 2022-12-04 DIAGNOSIS — Z1211 Encounter for screening for malignant neoplasm of colon: Secondary | ICD-10-CM

## 2022-12-04 DIAGNOSIS — Z7901 Long term (current) use of anticoagulants: Secondary | ICD-10-CM

## 2022-12-04 DIAGNOSIS — R7303 Prediabetes: Secondary | ICD-10-CM

## 2022-12-04 DIAGNOSIS — N1831 Chronic kidney disease, stage 3a: Secondary | ICD-10-CM

## 2022-12-04 DIAGNOSIS — I10 Essential (primary) hypertension: Secondary | ICD-10-CM

## 2022-12-04 DIAGNOSIS — Z8673 Personal history of transient ischemic attack (TIA), and cerebral infarction without residual deficits: Secondary | ICD-10-CM

## 2022-12-04 DIAGNOSIS — D6851 Activated protein C resistance: Secondary | ICD-10-CM

## 2022-12-06 ENCOUNTER — Other Ambulatory Visit: Payer: Self-pay | Admitting: Internal Medicine

## 2022-12-06 DIAGNOSIS — E038 Other specified hypothyroidism: Secondary | ICD-10-CM

## 2022-12-10 ENCOUNTER — Other Ambulatory Visit: Payer: Self-pay | Admitting: Internal Medicine

## 2022-12-11 DIAGNOSIS — I9589 Other hypotension: Secondary | ICD-10-CM | POA: Diagnosis not present

## 2022-12-11 DIAGNOSIS — N1831 Chronic kidney disease, stage 3a: Secondary | ICD-10-CM | POA: Diagnosis not present

## 2022-12-11 DIAGNOSIS — R809 Proteinuria, unspecified: Secondary | ICD-10-CM | POA: Diagnosis not present

## 2022-12-11 DIAGNOSIS — Z8679 Personal history of other diseases of the circulatory system: Secondary | ICD-10-CM | POA: Diagnosis not present

## 2022-12-11 DIAGNOSIS — E559 Vitamin D deficiency, unspecified: Secondary | ICD-10-CM | POA: Diagnosis not present

## 2022-12-22 ENCOUNTER — Other Ambulatory Visit: Payer: Self-pay | Admitting: Internal Medicine

## 2022-12-22 DIAGNOSIS — N1831 Chronic kidney disease, stage 3a: Secondary | ICD-10-CM

## 2022-12-22 DIAGNOSIS — I10 Essential (primary) hypertension: Secondary | ICD-10-CM

## 2023-01-04 ENCOUNTER — Telehealth: Payer: Self-pay | Admitting: Internal Medicine

## 2023-01-04 NOTE — Telephone Encounter (Signed)
Spoke to [employer]

## 2023-01-04 NOTE — Telephone Encounter (Signed)
Pt sister called stating that he is running a fever 100.4, BP 131/80 & HR 93. Wants to know what to do?

## 2023-01-05 ENCOUNTER — Telehealth (INDEPENDENT_AMBULATORY_CARE_PROVIDER_SITE_OTHER): Payer: 59 | Admitting: Family Medicine

## 2023-01-05 ENCOUNTER — Telehealth: Payer: Self-pay | Admitting: Internal Medicine

## 2023-01-05 ENCOUNTER — Encounter: Payer: Self-pay | Admitting: Family Medicine

## 2023-01-05 DIAGNOSIS — U071 COVID-19: Secondary | ICD-10-CM | POA: Diagnosis not present

## 2023-01-05 DIAGNOSIS — R058 Other specified cough: Secondary | ICD-10-CM

## 2023-01-05 MED ORDER — NIRMATRELVIR/RITONAVIR (PAXLOVID) TABLET (RENAL DOSING): 2.0000 | ORAL_TABLET | Freq: Two times a day (BID) | ORAL | 0 refills | Status: DC

## 2023-01-05 MED ORDER — MOLNUPIRAVIR EUA 200MG CAPSULE: 4.0000 | ORAL_CAPSULE | Freq: Two times a day (BID) | ORAL | 0 refills | Status: AC

## 2023-01-05 MED ORDER — GUAIFENESIN 100 MG/5ML PO LIQD: 5.0000 mL | ORAL | 0 refills | Status: DC | PRN

## 2023-01-05 NOTE — Telephone Encounter (Signed)
Iatan called said the medicine that was sent in nirmatrelvir/ritonavir, renal dosing, (PAXLOVID) 10 x 150 MG & 10 x '100MG'$  TABS    Has a drug interaction with another medication that the patient is taking can cause renal failure. Please contact pharmacist at [phone]

## 2023-01-05 NOTE — Addendum Note (Signed)
Addended byAlvira Monday on: 01/05/2023 04:12 PM   Modules accepted: Orders

## 2023-01-05 NOTE — Progress Notes (Addendum)
Virtual Visit via Video Note  I connected with Matthew Wise on 01/06/23 at 10:40 AM EST by a video enabled telemedicine application and verified that I am speaking with the correct person using two identifiers.  Patient Location: Home Provider Location: Office/Clinic  I discussed the limitations, risks, security, and privacy concerns of performing an evaluation and management service by video and the availability of in person appointments. I also discussed with the patient that there may be a patient responsible charge related to this service. The patient expressed understanding and agreed to proceed.  Subjective: PCP: Lindell Spar, MD  Chief Complaint  Patient presents with   Covid Positive    Pt reports low grade fever all day yesterday (01/04/2023) fever was staying under 103.0, also has sx of cough. Tested positive on 01/04/23 with an at home covid test, sx started 01/04/23 in the am.   HPI  The patient tested positive for COVID on 01/04/2023.  He reports a low-grade fever of less than 103.0 on 01/04/2023.  His temperature today is 99.9.  He complains of a slight cough with fatigue.  He denies body aches, headaches, muscle aches, trouble breathing, nasal congestion, and problems with sense of smell and taste.   ROS: Per HPI  Current Outpatient Medications:    acetaminophen (TYLENOL) 325 MG tablet, Take 2 tablets (650 mg total) by mouth every 4 (four) hours as needed for mild pain (or temp > 37.5 C (99.5 F))., Disp: , Rfl:    amLODipine (NORVASC) 5 MG tablet, Take 1 tablet by mouth once daily, Disp: 90 tablet, Rfl: 0   aspirin EC 81 MG EC tablet, Take 1 tablet (81 mg total) by mouth daily. Swallow whole., Disp: 30 tablet, Rfl: 11   atorvastatin (LIPITOR) 40 MG tablet, Take 1 tablet by mouth once daily, Disp: 90 tablet, Rfl: 0   cetirizine (ZYRTEC) 10 MG tablet, Take 1 tablet (10 mg total) by mouth daily., Disp: 30 tablet, Rfl: 11   guaiFENesin (ROBITUSSIN) 100 MG/5ML liquid, Take  5 mLs by mouth every 4 (four) hours as needed for cough or to loosen phlegm., Disp: 120 mL, Rfl: 0   levETIRAcetam (KEPPRA) 500 MG tablet, Take 1 tablet (500 mg total) by mouth 2 (two) times daily., Disp: 180 tablet, Rfl: 3   levothyroxine (SYNTHROID) 25 MCG tablet, Take 1 tablet by mouth once daily, Disp: 90 tablet, Rfl: 0   lisinopril (ZESTRIL) 2.5 MG tablet, Take 1 tablet by mouth once daily, Disp: 90 tablet, Rfl: 0   molnupiravir EUA (LAGEVRIO) 200 mg CAPS capsule, Take 4 capsules (800 mg total) by mouth 2 (two) times daily for 5 days., Disp: 40 capsule, Rfl: 0   pantoprazole (PROTONIX) 40 MG tablet, TAKE 1 TABLET BY MOUTH EVERYDAY AT BEDTIME (Patient taking differently: Take 40 mg by mouth at bedtime.), Disp: 90 tablet, Rfl: 1   pantoprazole (PROTONIX) 40 MG tablet, Take 1 tablet by mouth once daily, Disp: 90 tablet, Rfl: 0   PRADAXA 150 MG CAPS capsule, TAKE 1 CAPSULE BY MOUTH EVERY 12 HOURS, Disp: 240 capsule, Rfl: 0   senna-docusate (SENOKOT-S) 8.6-50 MG tablet, Take 1 tablet by mouth at bedtime., Disp: , Rfl:    tamsulosin (FLOMAX) 0.4 MG CAPS capsule, TAKE 1 CAPSULE BY MOUTH ONCE DAILY AFTER SUPPER, Disp: 90 capsule, Rfl: 0  Observations/Objective: There were no vitals filed for this visit. Physical Exam  No labored breathing.  Speech is clear and coherent with logical content.  Patient is alert and  oriented at baseline.   Assessment and Plan: COVID-positive Patient is on pradaxa and cannot take paxlovid  Will treat with molnupiravir Robitussin order for cough Encouraged to take medication as prescribed. Encouraged to increase fluids and allow for plenty of rest Recommend Tylenol as needed for pain, fever, or general discomfort. Recommend using a humidifier at bedtime during sleep to help with cough and nasal congestion.     Follow Up Instructions: Follow-up if your symptoms do not improve   I discussed the assessment and treatment plan with the patient. The patient was  provided an opportunity to ask questions, and all were answered. The patient agreed with the plan and demonstrated an understanding of the instructions.   The patient was advised to call back or seek an in-person evaluation if the symptoms worsen or if the condition fails to improve as anticipated.  The above assessment and management plan was discussed with the patient. The patient verbalized understanding of and has agreed to the management plan.   Alvira Monday, FNP

## 2023-01-06 ENCOUNTER — Ambulatory Visit: Payer: 59 | Admitting: Internal Medicine

## 2023-01-15 ENCOUNTER — Ambulatory Visit (INDEPENDENT_AMBULATORY_CARE_PROVIDER_SITE_OTHER): Payer: 59 | Admitting: Internal Medicine

## 2023-01-15 ENCOUNTER — Encounter: Payer: Self-pay | Admitting: Internal Medicine

## 2023-01-15 VITALS — BP 113/79 | HR 75 | Ht 72.0 in | Wt 255.0 lb

## 2023-01-15 DIAGNOSIS — I10 Essential (primary) hypertension: Secondary | ICD-10-CM

## 2023-01-15 DIAGNOSIS — J019 Acute sinusitis, unspecified: Secondary | ICD-10-CM | POA: Diagnosis not present

## 2023-01-15 DIAGNOSIS — N401 Enlarged prostate with lower urinary tract symptoms: Secondary | ICD-10-CM

## 2023-01-15 DIAGNOSIS — E782 Mixed hyperlipidemia: Secondary | ICD-10-CM | POA: Diagnosis not present

## 2023-01-15 DIAGNOSIS — E039 Hypothyroidism, unspecified: Secondary | ICD-10-CM | POA: Diagnosis not present

## 2023-01-15 DIAGNOSIS — K219 Gastro-esophageal reflux disease without esophagitis: Secondary | ICD-10-CM | POA: Diagnosis not present

## 2023-01-15 DIAGNOSIS — Z7901 Long term (current) use of anticoagulants: Secondary | ICD-10-CM

## 2023-01-15 DIAGNOSIS — G8110 Spastic hemiplegia affecting unspecified side: Secondary | ICD-10-CM

## 2023-01-15 DIAGNOSIS — R569 Unspecified convulsions: Secondary | ICD-10-CM

## 2023-01-15 DIAGNOSIS — R351 Nocturia: Secondary | ICD-10-CM

## 2023-01-15 DIAGNOSIS — D6851 Activated protein C resistance: Secondary | ICD-10-CM | POA: Diagnosis not present

## 2023-01-15 DIAGNOSIS — R7303 Prediabetes: Secondary | ICD-10-CM | POA: Diagnosis not present

## 2023-01-15 DIAGNOSIS — N1831 Chronic kidney disease, stage 3a: Secondary | ICD-10-CM | POA: Diagnosis not present

## 2023-01-15 DIAGNOSIS — I639 Cerebral infarction, unspecified: Secondary | ICD-10-CM | POA: Diagnosis not present

## 2023-01-15 HISTORY — DX: Gastro-esophageal reflux disease without esophagitis: K21.9

## 2023-01-15 MED ORDER — TAMSULOSIN HCL 0.4 MG PO CAPS: ORAL_CAPSULE | ORAL | 3 refills | Status: DC

## 2023-01-15 MED ORDER — AMOXICILLIN-POT CLAVULANATE 875-125 MG PO TABS: 1.0000 | ORAL_TABLET | Freq: Two times a day (BID) | ORAL | 0 refills | Status: DC

## 2023-01-15 MED ORDER — AMLODIPINE BESYLATE 5 MG PO TABS: 5.0000 mg | ORAL_TABLET | Freq: Every day | ORAL | 3 refills | Status: DC

## 2023-01-15 MED ORDER — PANTOPRAZOLE SODIUM 40 MG PO TBEC: 40.0000 mg | DELAYED_RELEASE_TABLET | Freq: Every day | ORAL | 3 refills | Status: DC

## 2023-01-15 MED ORDER — BENZONATATE 200 MG PO CAPS: 200.0000 mg | ORAL_CAPSULE | Freq: Two times a day (BID) | ORAL | 0 refills | Status: DC | PRN

## 2023-01-15 MED ORDER — PRADAXA 150 MG PO CAPS: 150.0000 mg | ORAL_CAPSULE | Freq: Two times a day (BID) | ORAL | 3 refills | Status: DC

## 2023-01-15 NOTE — Progress Notes (Signed)
Established Patient Office Visit  Subjective:  Patient ID: Matthew Wise, male    DOB: Apr 09, 1961  Age: 62 y.o. MRN: [id number]  CC:  Chief Complaint  Patient presents with   Hypertension    Six month follow up. Patient has a cough that has been going on for a week now    HPI Matthew Wise is a 62 y.o. 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  He c/o cough for about 10 days.  He had COVID infection recently, and was given molnupiravir.  He denies any fever, chills, dyspnea or wheezing.  He still has nasal congestion and postnasal drip.  HTN: BP is well controlled now.  He is taking amlodipine 5 mg daily and lisinopril 2.5 mg daily.  He denies any headache, dizziness, chest pain or palpitations.   CKD: He currently denies any dysuria, hematuria, urinary hesitancy or resistance.  He has followed up with Dr. Theador Hawthorne.   H/o CVA: His weakness of the left UE and LE have improved since the last visit.  He is able to walk short distances in the home with the support. He has completed PT.    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 two simple tubular adenomas removed. patient was advised to go to [facility] for colonoscopy but he did not keep appt.   IR CT HEAD LTD  02/02/2021   IR PERCUTANEOUS ART THROMBECTOMY/INFUSION INTRACRANIAL INC DIAG ANGIO  02/02/2021   RADIOLOGY WITH ANESTHESIA N/A 02/02/2021   Procedure: IR WITH ANESTHESIA;  Surgeon: Luanne Bras, MD;  Location: [place];  Service: Radiology;  Laterality: N/A;   TEE WITHOUT CARDIOVERSION N/A 09/10/2020   Procedure: TRANSESOPHAGEAL ECHOCARDIOGRAM (TEE) WITH PROPOFOL;  Surgeon: Satira Sark, MD;  Location: [facility];  Service: Cardiovascular;  Laterality: N/A;    Family History  Problem Relation Age of Onset   COPD Father    Stroke Brother    Pulmonary embolism Sister    Throat cancer Brother    Arthritis Mother    Diabetes Mother    Stroke Mother    Hypertension Brother    Diabetes Brother    Arthritis Brother    Diabetes Sister    Colon cancer Neg Hx     Social History   Socioeconomic History  Marital status: Divorced    Spouse name: Not on file   Number of children: 2   Years of education: 27   Highest education level: Not on file  Occupational History   Occupation: disability    Comment: DVT  Tobacco Use   Smoking status: Former    Packs/day: 1.00    Years: 30.00    Total pack years: 30.00    Types: Cigarettes    Start date: 12/01/1983   Smokeless tobacco: Never  Vaping Use   Vaping Use: Never used  Substance and Sexual Activity   Alcohol use: Not Currently    Alcohol/week: 3.0 standard drinks of alcohol    Types: 3 Cans of beer per week     Comment: has not drank sice 11/27/20   Drug use: Not Currently    Comment: REMOTE HISTORY, Quit in 2007   Sexual activity: Yes  Other Topics Concern   Not on file  Social History Narrative   Lives with sister   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   Right Handed    Drinks caffeine on occassion   Social Determinants of Health   Financial Resource Strain: Low Risk  (10/15/2022)   Overall Financial Resource Strain (CARDIA)    Difficulty of Paying Living Expenses: Not hard at all  Food Insecurity: No Food Insecurity (10/15/2022)   Hunger Vital Sign    Worried About Running Out of Food in the Last Year: Never true    [place] in the Last Year: Never true  Transportation Needs: No Transportation Needs (10/15/2022)   PRAPARE - Hydrologist (Medical): No    Lack of Transportation (Non-Medical): No  Physical Activity: Insufficiently Active (10/01/2021)   Exercise Vital Sign    Days of Exercise per Week: 5 days    Minutes of Exercise per Session: 20 min  Stress: No Stress Concern Present (10/15/2022)   [place]    Feeling of Stress : Not at all  Social Connections: Moderately Isolated (10/15/2022)   Social Connection and Isolation Panel [NHANES]    Frequency of Communication with Friends and Family: More than three times a week    Frequency of Social Gatherings with Friends and Family: Three times a week    Attends Religious Services: More than 4 times per year    Active Member of Clubs or Organizations: No    Attends Archivist Meetings: Never    Marital Status: Divorced  Human resources officer Violence: Not At Risk (10/01/2021)   Humiliation, Afraid, Rape, and Kick questionnaire    Fear of Current or Ex-Partner: No    Emotionally Abused: No    Physically Abused: No    Sexually Abused: No    Outpatient Medications Prior to Visit  Medication Sig Dispense Refill    acetaminophen (TYLENOL) 325 MG tablet Take 2 tablets (650 mg total) by mouth every 4 (four) hours as needed for mild pain (or temp > 37.5 C (99.5 F)).     aspirin EC 81 MG EC tablet Take 1 tablet (81 mg total) by mouth daily. Swallow whole. 30 tablet 11   atorvastatin (LIPITOR) 40 MG tablet Take 1 tablet by mouth once daily 90 tablet 0   cetirizine (ZYRTEC) 10 MG tablet Take 1 tablet (10 mg total) by mouth daily. 30 tablet 11   guaiFENesin (ROBITUSSIN) 100 MG/5ML liquid Take 5 mLs by mouth every 4 (four) hours as needed  for cough or to loosen phlegm. 120 mL 0   levETIRAcetam (KEPPRA) 500 MG tablet Take 1 tablet (500 mg total) by mouth 2 (two) times daily. 180 tablet 3   levothyroxine (SYNTHROID) 25 MCG tablet Take 1 tablet by mouth once daily 90 tablet 0   lisinopril (ZESTRIL) 2.5 MG tablet Take 1 tablet by mouth once daily 90 tablet 0   senna-docusate (SENOKOT-S) 8.6-50 MG tablet Take 1 tablet by mouth at bedtime.     amLODipine (NORVASC) 5 MG tablet Take 1 tablet by mouth once daily 90 tablet 0   pantoprazole (PROTONIX) 40 MG tablet TAKE 1 TABLET BY MOUTH EVERYDAY AT BEDTIME (Patient taking differently: Take 40 mg by mouth at bedtime.) 90 tablet 1   pantoprazole (PROTONIX) 40 MG tablet Take 1 tablet by mouth once daily 90 tablet 0   PRADAXA 150 MG CAPS capsule TAKE 1 CAPSULE BY MOUTH EVERY 12 HOURS 240 capsule 0   tamsulosin (FLOMAX) 0.4 MG CAPS capsule TAKE 1 CAPSULE BY MOUTH ONCE DAILY AFTER SUPPER 90 capsule 0   No facility-administered medications prior to visit.    No Known Allergies  ROS Review of Systems  Constitutional:  Positive for fatigue. Negative for chills and fever.  HENT:  Negative for congestion, postnasal drip, rhinorrhea and sore throat.   Eyes:  Negative for pain and discharge.  Respiratory:  Negative for cough and shortness of breath.   Cardiovascular:  Negative for chest pain and palpitations.  Gastrointestinal:  Positive for constipation. Negative for diarrhea,  nausea and vomiting.  Endocrine: Negative for polydipsia and polyuria.  Genitourinary:  Negative for dysuria and hematuria.  Musculoskeletal:  Positive for arthralgias and neck stiffness. Negative for neck pain.  Skin:  Negative for rash.  Neurological:  Positive for weakness and numbness. Negative for dizziness and headaches.  Psychiatric/Behavioral:  Negative for agitation and behavioral problems.       Objective:    Physical Exam Vitals reviewed.  Constitutional:      General: He is not in acute distress.    Appearance: He is not diaphoretic.     Comments: In wheelchair  HENT:     Head: Normocephalic and atraumatic.     Nose: Congestion present.     Mouth/Throat:     Mouth: Mucous membranes are moist.  Eyes:     General: No scleral icterus.    Extraocular Movements: Extraocular movements intact.  Cardiovascular:     Rate and Rhythm: Normal rate and regular rhythm.     Pulses: Normal pulses.     Heart sounds: Normal heart sounds. No murmur heard. Pulmonary:     Breath sounds: Normal breath sounds. No wheezing or rales.  Musculoskeletal:     Cervical back: Neck supple. No tenderness.     Right lower leg: No edema.     Left lower leg: No edema.  Skin:    General: Skin is warm.     Findings: No rash.  Neurological:     General: No focal deficit present.     Mental Status: He is alert and oriented to person, place, and time.     Sensory: Sensory deficit (Left sided hemiparesis) present.     Motor: Weakness (Left UE and LE 2/5) present.  Psychiatric:        Mood and Affect: Mood normal.        Behavior: Behavior normal.     BP 113/79 (BP Location: Right Arm, Patient Position: Sitting, Cuff Size: Large)   Pulse 75  Ht 6' (1.829 m)   Wt 255 lb (115.7 kg)   SpO2 97%   BMI 34.58 kg/m  Wt Readings from Last 3 Encounters:  01/15/23 255 lb (115.7 kg)  10/21/22 255 lb 1.3 oz (115.7 kg)  04/30/22 240 lb (108.9 kg)    Lab Results  Component Value Date   TSH 3.840  03/25/2022   Lab Results  Component Value Date   WBC 9.5 04/25/2022   HGB 13.2 04/25/2022   HCT 39.5 04/25/2022   MCV 82.1 04/25/2022   PLT 173 04/25/2022   Lab Results  Component Value Date   NA 141 04/30/2022   K 4.6 04/30/2022   CO2 24 04/30/2022   GLUCOSE 42 (L) 04/30/2022   BUN 14 04/30/2022   CREATININE 1.44 (H) 04/30/2022   BILITOT 1.3 (H) 02/17/2022   ALKPHOS 123 02/17/2022   AST 23 02/17/2022   ALT 41 02/17/2022   PROT 7.9 02/17/2022   ALBUMIN 4.1 02/17/2022   CALCIUM 9.1 04/30/2022   ANIONGAP 7 04/25/2022   EGFR 56 (L) 04/30/2022   Lab Results  Component Value Date   CHOL 119 09/24/2021   Lab Results  Component Value Date   HDL 48 09/24/2021   Lab Results  Component Value Date   LDLCALC 57 09/24/2021   Lab Results  Component Value Date   TRIG 63 09/24/2021   Lab Results  Component Value Date   CHOLHDL 2.5 09/24/2021   Lab Results  Component Value Date   HGBA1C 5.9 (H) 02/03/2021      Assessment & Plan:   Problem List Items Addressed This Visit       Cardiovascular and Mediastinum   Essential hypertension    BP Readings from Last 1 Encounters:  01/15/23 113/79  Well-controlled with amlodipine and lisinopril Counseled for compliance with the medications Advised DASH diet      Relevant Medications   amLODipine (NORVASC) 5 MG tablet   PRADAXA 150 MG CAPS capsule     Respiratory   Acute non-recurrent sinusitis    Persistent nasal congestion, postnasal drip and cough Started empiric Augmentin Tessalon as needed for cough Can take Mucinex as needed for cough Continue Zyrtec for allergies      Relevant Medications   benzonatate (TESSALON) 200 MG capsule   amoxicillin-clavulanate (AUGMENTIN) 875-125 MG tablet     Digestive   Gastroesophageal reflux disease    Well-controlled with Pantoprazole      Relevant Medications   pantoprazole (PROTONIX) 40 MG tablet     Endocrine   Hypothyroidism    Lab Results  Component Value  Date   TSH 3.840 03/25/2022  On Levothyroxine 25 mcg QD Check TSH and free T4      Relevant Orders   TSH + free T4     Nervous and Auditory   Spastic hemiplegia affecting nondominant side ([place]) - Primary    CVA in 10/2020 and 01/2021, s/p thrombectomy Has residual left-sided hemiplegia and hemiparesis - had worsening of his left-sided weakness likely due to dehydration from UTI -now improving Wheelchair-bound, ambulates short distances at home On aspirin, statin and Pradaxa 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)    No recent seizure episode On Keppra        Genitourinary   BPH (benign prostatic hyperplasia)    On Flomax      Relevant  Medications   tamsulosin (FLOMAX) 0.4 MG CAPS capsule   Stage 3a chronic kidney disease ([facility])    Last BMP showed GFR - 52 Proteinuria improving Followed by nephrology- Dr. Theador Hawthorne On ACEi Avoid nephrotoxic agents        Other   Prediabetes    Advised to follow low carb diet for now      Relevant Orders   Hemoglobin A1c   Mixed hyperlipidemia    On statin Check lipid profile      Relevant Medications   amLODipine (NORVASC) 5 MG tablet   PRADAXA 150 MG CAPS capsule   Other Relevant Orders   Lipid panel   Other Visit Diagnoses     Chronic anticoagulation       Relevant Medications   PRADAXA 150 MG CAPS capsule   Factor V Leiden ([place])       Relevant Medications   PRADAXA 150 MG CAPS capsule       Meds ordered this encounter  Medications   benzonatate (TESSALON) 200 MG capsule    Sig: Take 1 capsule (200 mg total) by mouth 2 (two) times daily as needed for cough.    Dispense:  20 capsule    Refill:  0   amoxicillin-clavulanate (AUGMENTIN) 875-125 MG tablet    Sig: Take 1 tablet by mouth 2 (two) times daily.    Dispense:  10 tablet    Refill:  0   amLODipine (NORVASC) 5 MG tablet     Sig: Take 1 tablet (5 mg total) by mouth daily.    Dispense:  90 tablet    Refill:  3   PRADAXA 150 MG CAPS capsule    Sig: Take 1 capsule (150 mg total) by mouth every 12 (twelve) hours.    Dispense:  180 capsule    Refill:  3   pantoprazole (PROTONIX) 40 MG tablet    Sig: Take 1 tablet (40 mg total) by mouth daily.    Dispense:  90 tablet    Refill:  3   tamsulosin (FLOMAX) 0.4 MG CAPS capsule    Sig: TAKE 1 CAPSULE BY MOUTH ONCE DAILY AFTER SUPPER    Dispense:  90 capsule    Refill:  3    Follow-up: Return in about 6 months (around 07/16/2023).    Lindell Spar, MD

## 2023-01-15 NOTE — Assessment & Plan Note (Signed)
Last BMP showed GFR - 52 Proteinuria improving Followed by nephrology- Dr. Theador Hawthorne On ACEi Avoid nephrotoxic agents

## 2023-01-15 NOTE — Assessment & Plan Note (Signed)
BP Readings from Last 1 Encounters:  01/15/23 113/79   Well-controlled with amlodipine and lisinopril Counseled for compliance with the medications Advised DASH diet

## 2023-01-15 NOTE — Patient Instructions (Signed)
Please continue to take medications as prescribed.  Please continue to follow low salt diet and ambulate as tolerated.  Please get fasting blood tests done within a week.

## 2023-01-15 NOTE — Assessment & Plan Note (Signed)
On statin Check lipid profile 

## 2023-01-15 NOTE — Assessment & Plan Note (Signed)
Well-controlled with Pantoprazole

## 2023-01-15 NOTE — Assessment & Plan Note (Signed)
Persistent nasal congestion, postnasal drip and cough Started empiric Augmentin Tessalon as needed for cough Can take Mucinex as needed for cough Continue Zyrtec for allergies

## 2023-01-15 NOTE — Assessment & Plan Note (Signed)
Advised to follow low carb diet for now

## 2023-01-15 NOTE — Assessment & Plan Note (Signed)
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

## 2023-01-15 NOTE — Assessment & Plan Note (Signed)
On Flomax

## 2023-01-15 NOTE — Assessment & Plan Note (Signed)
Lab Results  Component Value Date   TSH 3.840 03/25/2022   On Levothyroxine 25 mcg QD Check TSH and free T4

## 2023-01-15 NOTE — Assessment & Plan Note (Signed)
No recent seizure episode On Keppra

## 2023-02-21 ENCOUNTER — Other Ambulatory Visit: Payer: Self-pay | Admitting: Adult Health

## 2023-02-21 DIAGNOSIS — Z7901 Long term (current) use of anticoagulants: Secondary | ICD-10-CM

## 2023-02-27 ENCOUNTER — Other Ambulatory Visit: Payer: Self-pay | Admitting: Internal Medicine

## 2023-02-27 DIAGNOSIS — Z1211 Encounter for screening for malignant neoplasm of colon: Secondary | ICD-10-CM

## 2023-02-27 DIAGNOSIS — N1831 Chronic kidney disease, stage 3a: Secondary | ICD-10-CM

## 2023-02-27 DIAGNOSIS — I10 Essential (primary) hypertension: Secondary | ICD-10-CM

## 2023-02-27 DIAGNOSIS — Z7901 Long term (current) use of anticoagulants: Secondary | ICD-10-CM

## 2023-02-27 DIAGNOSIS — R7303 Prediabetes: Secondary | ICD-10-CM

## 2023-03-05 ENCOUNTER — Other Ambulatory Visit: Payer: Self-pay | Admitting: Internal Medicine

## 2023-03-05 DIAGNOSIS — E038 Other specified hypothyroidism: Secondary | ICD-10-CM

## 2023-03-10 ENCOUNTER — Other Ambulatory Visit: Payer: Self-pay | Admitting: Internal Medicine

## 2023-03-10 DIAGNOSIS — E038 Other specified hypothyroidism: Secondary | ICD-10-CM

## 2023-03-13 LAB — HEMOGLOBIN A1C
Est. average glucose Bld gHb Est-mCnc: 123 mg/dL
Hgb A1c MFr Bld: 5.9 % — ABNORMAL HIGH (ref 4.8–5.6)

## 2023-03-13 LAB — TSH+FREE T4
Free T4: 1.5 ng/dL (ref 0.82–1.77)
TSH: 4.02 u[IU]/mL (ref 0.450–4.500)

## 2023-03-13 LAB — LIPID PANEL
Chol/HDL Ratio: 2.5 ratio (ref 0.0–5.0)
Cholesterol, Total: 126 mg/dL (ref 100–199)
HDL: 50 mg/dL (ref >39)
LDL Chol Calc (NIH): 60 mg/dL (ref 0–99)
Triglycerides: 82 mg/dL (ref 0–149)
VLDL Cholesterol Cal: 16 mg/dL (ref 5–40)

## 2023-03-22 ENCOUNTER — Other Ambulatory Visit: Payer: Self-pay | Admitting: Internal Medicine

## 2023-03-22 DIAGNOSIS — N1831 Chronic kidney disease, stage 3a: Secondary | ICD-10-CM

## 2023-03-22 DIAGNOSIS — I10 Essential (primary) hypertension: Secondary | ICD-10-CM

## 2023-03-29 ENCOUNTER — Other Ambulatory Visit: Payer: Self-pay | Admitting: Internal Medicine

## 2023-03-29 DIAGNOSIS — J3089 Other allergic rhinitis: Secondary | ICD-10-CM

## 2023-04-05 DIAGNOSIS — N1831 Chronic kidney disease, stage 3a: Secondary | ICD-10-CM | POA: Diagnosis not present

## 2023-04-05 DIAGNOSIS — R809 Proteinuria, unspecified: Secondary | ICD-10-CM | POA: Diagnosis not present

## 2023-04-05 DIAGNOSIS — I10 Essential (primary) hypertension: Secondary | ICD-10-CM | POA: Diagnosis not present

## 2023-04-05 DIAGNOSIS — E559 Vitamin D deficiency, unspecified: Secondary | ICD-10-CM | POA: Diagnosis not present

## 2023-05-27 ENCOUNTER — Other Ambulatory Visit: Payer: Self-pay | Admitting: Internal Medicine

## 2023-05-27 ENCOUNTER — Other Ambulatory Visit: Payer: Self-pay | Admitting: Adult Health

## 2023-05-27 DIAGNOSIS — N1831 Chronic kidney disease, stage 3a: Secondary | ICD-10-CM

## 2023-05-27 DIAGNOSIS — Z7901 Long term (current) use of anticoagulants: Secondary | ICD-10-CM

## 2023-05-27 DIAGNOSIS — I10 Essential (primary) hypertension: Secondary | ICD-10-CM

## 2023-06-01 ENCOUNTER — Other Ambulatory Visit: Payer: Self-pay | Admitting: Internal Medicine

## 2023-06-01 DIAGNOSIS — Z7901 Long term (current) use of anticoagulants: Secondary | ICD-10-CM

## 2023-06-01 DIAGNOSIS — R7303 Prediabetes: Secondary | ICD-10-CM

## 2023-06-01 DIAGNOSIS — N1831 Chronic kidney disease, stage 3a: Secondary | ICD-10-CM

## 2023-06-01 DIAGNOSIS — Z1211 Encounter for screening for malignant neoplasm of colon: Secondary | ICD-10-CM

## 2023-06-01 DIAGNOSIS — I10 Essential (primary) hypertension: Secondary | ICD-10-CM

## 2023-06-07 ENCOUNTER — Other Ambulatory Visit: Payer: Self-pay | Admitting: Internal Medicine

## 2023-06-07 DIAGNOSIS — E038 Other specified hypothyroidism: Secondary | ICD-10-CM

## 2023-06-16 ENCOUNTER — Telehealth: Payer: Self-pay | Admitting: Adult Health

## 2023-06-16 ENCOUNTER — Encounter: Payer: Self-pay | Admitting: Anesthesiology

## 2023-06-16 DIAGNOSIS — Z7901 Long term (current) use of anticoagulants: Secondary | ICD-10-CM

## 2023-06-16 MED ORDER — LEVETIRACETAM 500 MG PO TABS
500.0000 mg | ORAL_TABLET | Freq: Two times a day (BID) | ORAL | 0 refills | Status: DC
Start: 2023-06-16 — End: 2023-06-29

## 2023-06-16 NOTE — Telephone Encounter (Signed)
Pt is needing a refill request for his  levETIRAcetam (KEPPRA) 500 MG tablet sent in to the [facility] in [place]

## 2023-06-16 NOTE — Telephone Encounter (Signed)
Sent message to patient through Mychart stating he needs to schedule an appointment to see Shanda Bumps in order to continue getting refills on his medication.

## 2023-06-28 NOTE — Progress Notes (Deleted)
[facility] [address] [place]. [place] [address] [phone]       OFFICE FOLLOW UP NOTE  Matthew Wise Date of Birth:  May 27, 1961 Medical Record Number:  [id number]   Primary neurologist: Dr. Pearlean Brownie Referring MD:  Deatra Ina, PA-c  Reason for Referral:  Stroke  No chief complaint on file.     HPI:   Update 06/29/2023 JM: Patient returns for overdue stroke and seizure follow-up.  Stable from stroke standpoint, denies new stroke/TIA symptoms.  SSI, previously stable.  Ambulates with cane and AFO short distance and w/c long distance. No recent falls.  Remains on aspirin, Pradaxa and atorvastatin.  Routinely follows with PCP for stroke risk factor management.  Denies any seizure activity.  Compliant on Keppra 500 mg twice daily, tolerating well  referred to sleep clinic at prior visit, no showed initial evaluation    History provided for reference purposes only Update, 12/17/2021, JM: Matthew Wise is here today for a stroke follow-up accompanied by his son. Overall stable without new stroke/TIA symptoms. 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  Ambulates short distance with cane and AFO brace, w/c for long distance. No recent falls.  Denies residual visual impairment. Completed therapy back in November as max rehab potential met per note review - he does question possibly returning. Considering pursuing botox with Dr. Wynn Banker - has f/u next month to further discuss.  He lives with his sister and she helps with his ADLs. Denies any seizure activity on Keppra 500 mg twice daily, denies side effects.  Remains on Atorvastatin, Aspirin, and Pradaxa without side effects. BP today 118/64. Labs A1c 5.9 (02/03/2021), LDL 57 (09/24/2021).  No further concerns at this time  Initial visit 06/09/2021 Dr Pearlean Brownie: Matthew Wise is a 62 year old African-American male seen today for initial office consultation visit for  stroke.  He is accompanied by his son today.  History is obtained from them and review of electronic medical records and I personally reviewed pertinent available imaging films in PACS.  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  He presented on 02/02/2021 with sudden onset of left facial droop and left hemiparesis.  He was seen by telemetry neurology and CT angiogram showed right MCA M1 filling defect and perfusion deficit hence was transferred to [facility] for consideration for endovascular treatment.  CT perfusion showed 57 cc of penumbra Patient was taken for emergent thrombectomy with revascularization of occluded right MCA M1 segment with 2 passes of Solitaire stent retriever device and aspiration resulting in TICI 2 c revascularization.  He was kept in the ICU and blood pressure strictly monitored.  MRI scan showed acute large right MCA infarct with petechial hemorrhage with minimum leftward shift there were also small acute infarcts noted in the left cerebellum and left occipital lobe as well as left parietal and left frontal lobes but does not clear whether these were periprocedural.  2D echo done previously on 01/22/2021 had shown left ventricular ejection fraction of 55 to 60% with no significant regional wall motion abnormalities.  There was  left atrial dilatation noted.  LDL cholesterol 25 mg percent and hemoglobin A1c was 5.9.  Patient had previous history of DVT and pulmonary embolism and lower extremity venous ultrasound showed age-indeterminate determinate left femoral and popliteal thrombus.  Patient had been on Eliquis prior to this admission  and it was switched to Pradaxa 150 twice daily and as daily was also continued.  Patient had the seizure-like activity noted during the hospitalization and even though EEG had no  correlation there is twitchings were thought to be focal motor seizures which are difficult to capture an EEG and patient is started on Keppra.  Patient had a recent previous admission in February 2022 and MRI had shown right posterior temporal and parietal infarct in the inferior division MCA territory as well as acute infarction left cerebellum and right pontine tegmentum.  Previously TCD bubble study on 12/02/2020 was positive with delayed hits noted after Valsalva.  He had previously had a 30-day heart monitor in October 2021 which had not shown any significant atrial fibrillation has not arrhythmias.  Loop recorder was not placed since patient was already a candidate for long-term anticoagulation due to history of DVT and pulmonary embolism.    Patient was transferred to inpatient rehab where he made gradual improvement and is currently living at home.  He is getting outpatient therapy now and has finished home therapies.  Is able to walk with a walker with somebody has to be close to him.  He still has significant residual left hemiparesis he can barely extend his left elbow and move his left hip but continues to have left foot drop.  He is tolerating Pradaxa and aspirin without bruising or bleeding.  Blood pressure is under good control in fact recently tends to run low and today it is 103/70.  Is tolerating Lipitor well without muscle aches and pains.  He has had no new recurrent stroke symptoms and has no new complaints today.      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   Thyroid nodule 01/25/2021   Transient ischemic attack 09/07/2020    Social History:  Social History   Socioeconomic History   Marital status: Divorced    Spouse name: Not on file   Number of children: 2   Years of education: 12   Highest education level: Not on file  Occupational History   Occupation: disability    Comment: DVT  Tobacco Use   Smoking status: Former    Current packs/day: 1.00    Average packs/day: 1 pack/day for 39.6 years (39.6 ttl pk-yrs)    Types: Cigarettes    Start date: 12/01/1983   Smokeless tobacco: Never  Vaping Use   Vaping status: Never Used  Substance and Sexual Activity   Alcohol use: Not Currently  Alcohol/week: 3.0 standard drinks of alcohol    Types: 3 Cans of beer per week    Comment: has not drank sice 11/27/20   Drug use: Not Currently    Comment: REMOTE HISTORY, Quit in 2007   Sexual activity: Yes  Other Topics Concern   Not on file  Social History Narrative   Lives with sister   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   Right Handed    Drinks caffeine on occassion   Social Determinants of Health   Financial Resource Strain: Low Risk  (10/15/2022)   Overall Financial Resource Strain (CARDIA)    Difficulty of Paying Living Expenses: Not hard at all  Food Insecurity: No Food Insecurity (10/15/2022)   Hunger Vital Sign    Worried About Running Out of Food in the Last Year: Never true     Ran Out of Food in the Last Year: Never true  Transportation Needs: No Transportation Needs (10/15/2022)   PRAPARE - Administrator, Civil Service (Medical): No    Lack of Transportation (Non-Medical): No  Physical Activity: Insufficiently Active (10/01/2021)   Exercise Vital Sign    Days of Exercise per Week: 5 days    Minutes of Exercise per Session: 20 min  Stress: No Stress Concern Present (10/15/2022)   [employer] of Occupational Health - Occupational Stress Questionnaire    Feeling of Stress : Not at all  Social Connections: Moderately Isolated (10/15/2022)   Social Connection and Isolation Panel [NHANES]    Frequency of Communication with Friends and Family: More than three times a week    Frequency of Social Gatherings with Friends and Family: Three times a week    Attends Religious Services: More than 4 times per year    Active Member of Clubs or Organizations: No    Attends Banker Meetings: Never    Marital Status: Divorced  Catering manager Violence: Not At Risk (10/01/2021)   Humiliation, Afraid, Rape, and Kick questionnaire    Fear of Current or Ex-Partner: No    Emotionally Abused: No    Physically Abused: No    Sexually Abused: No    Medications:   Current Outpatient Medications on File Prior to Visit  Medication Sig Dispense Refill   acetaminophen (TYLENOL) 325 MG tablet Take 2 tablets (650 mg total) by mouth every 4 (four) hours as needed for mild pain (or temp > 37.5 C (99.5 F)).     amLODipine (NORVASC) 5 MG tablet Take 1 tablet (5 mg total) by mouth daily. 90 tablet 3   amoxicillin-clavulanate (AUGMENTIN) 875-125 MG tablet Take 1 tablet by mouth 2 (two) times daily. 10 tablet 0   aspirin EC 81 MG EC tablet Take 1 tablet (81 mg total) by mouth daily. Swallow whole. 30 tablet 11   atorvastatin (LIPITOR) 40 MG tablet Take 1 tablet by mouth once daily 90 tablet 0   benzonatate (TESSALON) 200 MG capsule Take 1 capsule (200 mg total) by  mouth 2 (two) times daily as needed for cough. 20 capsule 0   cetirizine (ZYRTEC) 10 MG tablet Take 1 tablet by mouth once daily 30 tablet 0   guaiFENesin (ROBITUSSIN) 100 MG/5ML liquid Take 5 mLs by mouth every 4 (four) hours as needed for cough or to loosen phlegm. 120 mL 0   levETIRAcetam (KEPPRA) 500 MG tablet Take 1 tablet (500 mg total) by mouth 2 (two) times daily. 30 tablet 0   levothyroxine (SYNTHROID) 25 MCG tablet Take  1 tablet by mouth once daily 90 tablet 0   lisinopril (ZESTRIL) 2.5 MG tablet Take 1 tablet by mouth once daily 90 tablet 0   pantoprazole (PROTONIX) 40 MG tablet Take 1 tablet (40 mg total) by mouth daily. 90 tablet 3   PRADAXA 150 MG CAPS capsule Take 1 capsule (150 mg total) by mouth every 12 (twelve) hours. 180 capsule 3   senna-docusate (SENOKOT-S) 8.6-50 MG tablet Take 1 tablet by mouth at bedtime.     tamsulosin (FLOMAX) 0.4 MG CAPS capsule TAKE 1 CAPSULE BY MOUTH ONCE DAILY AFTER SUPPER 90 capsule 3   No current facility-administered medications on file prior to visit.    Allergies:  No Known Allergies  Physical Exam There were no vitals filed for this visit.  There is no height or weight on file to calculate BMI.   General: Mildly obese very pleasant middle-aged African-American male, seated, in no evident distress Head: head normocephalic and atraumatic.   Neck: supple with no carotid or supraclavicular bruits Cardiovascular: regular rate and rhythm, no murmurs Musculoskeletal: no deformity Skin:  no rash/petichiae Vascular:  Normal pulses all extremities  Neurologic Exam Mental Status: Awake and fully alert. Oriented to place and time. Recent and remote memory intact. Attention span, concentration and fund of knowledge appropriate. Mood and affect appropriate.  Cranial Nerves: Pupils equal, briskly reactive to light. Extraocular movements full without nystagmus. Visual fields full to confrontation testing. Hearing intact. Facial sensation intact.  Left lower facial weakness. Tongue and palate move normally and symmetrically.  Motor: Spastic left hemiplegia with grade 1/5 strength in the left upper and lower extremity.  Is able to extend his left elbow and slightly move left shoulder.  He can similarly do some hip abduction and adduction only.  Left foot drop present.  Tone is increased on the left with spasticity compared to the right.  Normal strength on right side. Sensory.:  Diminished left hemibody sensation to touch , pinprick , position and vibratory sensation.  Coordination: Rapid alternating movements normal on right side. Finger-to-nose and heel-to-shin performed accurately on right side. Gait and Station: Deferred as patient is sitting in a wheelchair and did not bring his walker  reflexes: 2+ and asymmetric and brisker on the left. Toes downgoing.      ASSESSMENT: 62 year old African-American male with recurrent right middle cerebral artery infarcts (10/2020, 12/2020 and 01/2021) treated with mechanical thrombectomy in March 2022 without significant clinical improvement with residual significant spastic left hemiplegia.  He also had symptomatic focal motor seizures which are stable on the current medication regimen of Keppra.  Vascular risk factors of hypertension, hyperlipidemia , hypercoagulable state with BLE DVT and PE, smoking, obesity, sleep apnea, marijuana use, intracranial stenosis and multiple prior strokes   PLAN:   Ischemic stroke:  Residual deficit: Left spastic hemiparesis and gait impairment. Continue to follow with Dr. Wynn Banker for possible start of botox for spasticity. Will defer benefit of additional therapy to PMR - as of right now, likely will be of little benefit as prior therapy notes stated max rehab potential met 09/2021 Continue Aspirin 81mg  daily, Pradaxa 150mg  BID and atorvastatin for secondary stroke prevention.  medications to be managed by PCP or other specialties Discussed secondary stroke prevention  measures and importance of close PCP follow up for aggressive stroke risk factor management including BP goal<130/90, and HLD with LDL goal<70 .  I have gone over the pathophysiology of stroke, warning signs and symptoms, risk factors and their management in some detail  with instructions to go to the closest emergency room for symptoms of concern.  Sleep apnea: Positive for sleep apnea during eval for Sleep Smart trial but not a candidate due to presence of central sleep apneas.  Referral placed to [facility] sleep clinic for official sleep apnea evaluation - patient no showed initial evaluation -encourage patient to reschedule  Seizures:  Continue Keppra 500mg  BID for seizure prophylaxis -refill provided Due to size of recent stroke and prior strokes, he remains at risk of seizures therefore should remain on keppra at this time which was discussed with both patient and son Discussed importance of avoiding seizure provoking triggers    Follow-up in 6 months or call earlier if needed   CC:  Anabel Halon, MD   I spent 36 minutes of face-to-face and non-face-to-face time with patient and son.  This included previsit chart review, lab review, study review, order entry, electronic health record documentation, patient and son education and discussion regarding above diagnoses and treatment plan and answered all other questions to patient's satisfaction  Ihor Austin, [facility]  [facility] [address] [place], [place] [address]  Phone [phone] Fax [phone] Note: This document was prepared with digital dictation and possible smart phrase technology. Any transcriptional errors that result from this process are unintentional.

## 2023-06-29 ENCOUNTER — Ambulatory Visit: Payer: 59 | Admitting: Adult Health

## 2023-06-29 ENCOUNTER — Telehealth: Payer: Self-pay | Admitting: Adult Health

## 2023-06-29 DIAGNOSIS — Z7901 Long term (current) use of anticoagulants: Secondary | ICD-10-CM

## 2023-06-29 MED ORDER — LEVETIRACETAM 500 MG PO TABS
500.0000 mg | ORAL_TABLET | Freq: Two times a day (BID) | ORAL | 1 refills | Status: DC
Start: 2023-06-29 — End: 2023-08-24

## 2023-06-29 NOTE — Telephone Encounter (Signed)
Pt came in for appt that was canceled 06/29/2023. States he will need a refill of medication before rescheduled appt in October. States he took the last one this morning.   levETIRAcetam (KEPPRA) 500 MG tablet at  [facility] [phone]

## 2023-06-29 NOTE — Telephone Encounter (Signed)
I will send a refill in for the patient

## 2023-06-29 NOTE — Telephone Encounter (Signed)
Unable to LVM, VM box full. Sent text msg informing pt of need to reschedule 06/29/23 appt - NP out

## 2023-07-16 ENCOUNTER — Ambulatory Visit (INDEPENDENT_AMBULATORY_CARE_PROVIDER_SITE_OTHER): Payer: 59 | Admitting: Internal Medicine

## 2023-07-16 ENCOUNTER — Encounter: Payer: Self-pay | Admitting: Internal Medicine

## 2023-07-16 VITALS — BP 97/62 | HR 59 | Ht 72.0 in

## 2023-07-16 DIAGNOSIS — N401 Enlarged prostate with lower urinary tract symptoms: Secondary | ICD-10-CM

## 2023-07-16 DIAGNOSIS — E039 Hypothyroidism, unspecified: Secondary | ICD-10-CM

## 2023-07-16 DIAGNOSIS — Z0001 Encounter for general adult medical examination with abnormal findings: Secondary | ICD-10-CM | POA: Diagnosis not present

## 2023-07-16 DIAGNOSIS — R351 Nocturia: Secondary | ICD-10-CM

## 2023-07-16 DIAGNOSIS — N1831 Chronic kidney disease, stage 3a: Secondary | ICD-10-CM | POA: Diagnosis not present

## 2023-07-16 DIAGNOSIS — I10 Essential (primary) hypertension: Secondary | ICD-10-CM | POA: Diagnosis not present

## 2023-07-16 DIAGNOSIS — E782 Mixed hyperlipidemia: Secondary | ICD-10-CM

## 2023-07-16 DIAGNOSIS — D573 Sickle-cell trait: Secondary | ICD-10-CM

## 2023-07-16 DIAGNOSIS — R7303 Prediabetes: Secondary | ICD-10-CM | POA: Diagnosis not present

## 2023-07-16 DIAGNOSIS — G8110 Spastic hemiplegia affecting unspecified side: Secondary | ICD-10-CM | POA: Diagnosis not present

## 2023-07-16 NOTE — Assessment & Plan Note (Signed)
Lab Results  Component Value Date   TSH 4.020 03/12/2023   On Levothyroxine 25 mcg QD Check TSH and free T4

## 2023-07-16 NOTE — Assessment & Plan Note (Signed)
On statin Check lipid profile 

## 2023-07-16 NOTE — Assessment & Plan Note (Addendum)
Physical exam as documented. Fasting blood tests ordered. Advised to get Shingrix vaccines at local pharmacy.

## 2023-07-16 NOTE — Assessment & Plan Note (Signed)
Advised to follow low carb diet for now 

## 2023-07-16 NOTE — Assessment & Plan Note (Addendum)
Has borderline low Hb in the past He has CKD and also takes Pradaxa for history of DVT/PE and cryptogenic stroke Denies any active bleeding Check CBC

## 2023-07-16 NOTE — Assessment & Plan Note (Signed)
Last BMP showed GFR - 52 Proteinuria improving Followed by nephrology On ACEi Avoid nephrotoxic agents

## 2023-07-16 NOTE — Assessment & Plan Note (Signed)
BP Readings from Last 1 Encounters:  07/16/23 97/62   Well-controlled with amlodipine and lisinopril Counseled for compliance with the medications Advised DASH diet

## 2023-07-16 NOTE — Assessment & Plan Note (Signed)
CVA in 10/2020 and 01/2021, s/p thrombectomy Has residual left-sided hemiplegia and hemiparesis  Wheelchair-bound, ambulates short distances at home On aspirin, statin and Pradaxa 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 Has not seen cardiology in outpatient setting

## 2023-07-16 NOTE — Patient Instructions (Addendum)
Please continue to take medications as prescribed.  Please continue to follow low salt diet.  Please get fasting blood tests done before the next visit.  Please consider getting Shingrix vaccine at local pharmacy.

## 2023-07-16 NOTE — Assessment & Plan Note (Signed)
On Flomax 

## 2023-07-16 NOTE — Progress Notes (Signed)
Established Patient Office Visit  Subjective:  Patient ID: Matthew Wise, male    DOB: [birth year]  Age: 62 y.o. MRN: [id number]  CC:  Chief Complaint  Patient presents with   Hypertension    Six month follow up    Annual Exam    HPI Matthew Wise is a 62 y.o. 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    HTN: BP is well controlled now.  He is taking amlodipine 5 mg daily and lisinopril 2.5 mg daily.  He denies any headache, dizziness, chest pain or palpitations.  CKD: He currently denies any dysuria, hematuria, urinary hesitancy or resistance.  He has followed up with Dr. Wolfgang Phoenix.   H/o CVA: His weakness of the left UE and LE have improved since the last visit.  He is able to walk short distances in the home with the support.    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 two simple tubular adenomas removed. patient was advised to go [facility] but he did not keep appt.   IR CT HEAD LTD  02/02/2021   IR PERCUTANEOUS ART THROMBECTOMY/INFUSION INTRACRANIAL INC DIAG ANGIO  02/02/2021   RADIOLOGY WITH ANESTHESIA N/A 02/02/2021   Procedure: IR WITH ANESTHESIA;  Surgeon: Julieanne Cotton, MD;  Location: [facility] OR;  Service: Radiology;  Laterality: N/A;   TEE WITHOUT CARDIOVERSION N/A 09/10/2020   Procedure: TRANSESOPHAGEAL ECHOCARDIOGRAM (TEE) WITH PROPOFOL;  Surgeon: Jonelle Sidle, MD;  Location: [facility];  Service: Cardiovascular;  Laterality: N/A;    Family History  Problem Relation Age of Onset   COPD Father    Stroke Brother    Pulmonary embolism Sister    Throat cancer Brother    Arthritis Mother    Diabetes Mother    Stroke Mother    Hypertension Brother    Diabetes Brother    Arthritis Brother    Diabetes Sister    Colon cancer Neg Hx     Social History   Socioeconomic History   Marital status: Divorced    Spouse name: Not on file   Number of children: 2   Years of education: 12   Highest education level: Not on file  Occupational History   Occupation: disability    Comment: DVT  Tobacco Use   Smoking  status: Former    Current packs/day: 1.00    Average packs/day: 1 pack/day for 39.6 years (39.6 ttl pk-yrs)    Types: Cigarettes    Start date: 12/01/1983   Smokeless tobacco: Never  Vaping Use   Vaping status: Never Used  Substance and Sexual Activity   Alcohol use: Not Currently    Alcohol/week: 3.0 standard drinks of alcohol    Types: 3 Cans of beer per week    Comment: has not drank sice 11/27/20   Drug use: Not Currently    Comment: REMOTE HISTORY, Quit in 2007   Sexual activity: Yes  Other Topics Concern   Not on file  Social History Narrative   Lives with sister   Disabled from mult clots and leg  problem   Right Handed    Drinks caffeine on occassion   Social Determinants of Health   Financial Resource Strain: Low Risk  (10/15/2022)   Overall Financial Resource Strain (CARDIA)    Difficulty of Paying Living Expenses: Not hard at all  Food Insecurity: No Food Insecurity (10/15/2022)   Hunger Vital Sign    Worried About Running Out of Food in the Last Year: Never true    Ran Out of Food in the Last Year: Never true  Transportation Needs: No Transportation Needs (10/15/2022)   PRAPARE - Administrator, Civil Service (Medical): No    Lack of Transportation (Non-Medical): No  Physical Activity: Insufficiently Active (10/01/2021)   Exercise Vital Sign    Days of Exercise per Week: 5 days    Minutes of Exercise per Session: 20 min  Stress: No Stress Concern Present (10/15/2022)   [employer] of Occupational Health - Occupational Stress Questionnaire    Feeling of Stress : Not at all  Social Connections: Moderately Isolated (10/15/2022)   Social Connection and Isolation Panel [NHANES]    Frequency of Communication with Friends and Family: More than three times a week    Frequency of Social Gatherings with Friends and Family: Three times a week    Attends Religious Services: More than 4 times per year    Active Member of Clubs or Organizations: No    Attends Banker Meetings: Never    Marital Status: Divorced  Catering manager Violence: Not At Risk (10/01/2021)   Humiliation, Afraid, Rape, and Kick questionnaire    Fear of Current or Ex-Partner: No    Emotionally Abused: No    Physically Abused: No    Sexually Abused: No    Outpatient Medications Prior to Visit  Medication Sig Dispense Refill   acetaminophen (TYLENOL) 325 MG tablet Take 2 tablets (650 mg total) by mouth every 4 (four) hours as needed for mild pain (or temp > 37.5 C (99.5 F)).     amLODipine (NORVASC) 5 MG tablet Take 1 tablet (5 mg total) by mouth daily. 90 tablet 3   aspirin EC  81 MG EC tablet Take 1 tablet (81 mg total) by mouth daily. Swallow whole. 30 tablet 11   atorvastatin (LIPITOR) 40 MG tablet Take 1 tablet by mouth once daily 90 tablet 0   benzonatate (TESSALON) 200 MG capsule Take 1 capsule (200 mg total) by mouth 2 (two) times daily as needed for cough. 20 capsule 0   cetirizine (ZYRTEC) 10 MG tablet Take 1 tablet by mouth once daily 30 tablet 0   guaiFENesin (ROBITUSSIN) 100 MG/5ML liquid Take 5 mLs by mouth every 4 (four) hours as needed for cough or to  loosen phlegm. 120 mL 0   levETIRAcetam (KEPPRA) 500 MG tablet Take 1 tablet (500 mg total) by mouth 2 (two) times daily. 60 tablet 1   levothyroxine (SYNTHROID) 25 MCG tablet Take 1 tablet by mouth once daily 90 tablet 0   lisinopril (ZESTRIL) 2.5 MG tablet Take 1 tablet by mouth once daily 90 tablet 0   pantoprazole (PROTONIX) 40 MG tablet Take 1 tablet (40 mg total) by mouth daily. 90 tablet 3   PRADAXA 150 MG CAPS capsule Take 1 capsule (150 mg total) by mouth every 12 (twelve) hours. 180 capsule 3   senna-docusate (SENOKOT-S) 8.6-50 MG tablet Take 1 tablet by mouth at bedtime.     tamsulosin (FLOMAX) 0.4 MG CAPS capsule TAKE 1 CAPSULE BY MOUTH ONCE DAILY AFTER SUPPER 90 capsule 3   amoxicillin-clavulanate (AUGMENTIN) 875-125 MG tablet Take 1 tablet by mouth 2 (two) times daily. 10 tablet 0   No facility-administered medications prior to visit.    No Known Allergies  ROS Review of Systems  Constitutional:  Positive for fatigue. Negative for chills and fever.  HENT:  Negative for congestion, postnasal drip, rhinorrhea and sore throat.   Eyes:  Negative for pain and discharge.  Respiratory:  Negative for cough and shortness of breath.   Cardiovascular:  Negative for chest pain and palpitations.  Gastrointestinal:  Positive for constipation. Negative for diarrhea, nausea and vomiting.  Endocrine: Negative for polydipsia and polyuria.  Genitourinary:  Negative for dysuria and hematuria.   Musculoskeletal:  Positive for arthralgias and neck stiffness. Negative for neck pain.  Skin:  Negative for rash.  Neurological:  Positive for weakness and numbness. Negative for dizziness and headaches.  Psychiatric/Behavioral:  Negative for agitation and behavioral problems.       Objective:    Physical Exam Vitals reviewed.  Constitutional:      General: He is not in acute distress.    Appearance: He is not diaphoretic.     Comments: In wheelchair  HENT:     Head: Normocephalic and atraumatic.     Nose: No congestion.     Mouth/Throat:     Mouth: Mucous membranes are moist.  Eyes:     General: No scleral icterus.    Extraocular Movements: Extraocular movements intact.  Cardiovascular:     Rate and Rhythm: Normal rate and regular rhythm.     Pulses: Normal pulses.     Heart sounds: Normal heart sounds. No murmur heard. Pulmonary:     Breath sounds: Normal breath sounds. No wheezing or rales.  Abdominal:     Palpations: Abdomen is soft.     Tenderness: There is no abdominal tenderness.  Musculoskeletal:     Cervical back: Neck supple. No tenderness.     Right lower leg: No edema.     Left lower leg: No edema.  Skin:    General: Skin is warm.     Findings: No rash.  Neurological:     General: No focal deficit present.     Mental Status: He is alert and oriented to person, place, and time.     Sensory: Sensory deficit (Left sided hemiparesis) present.     Motor: Weakness (Left UE and LE 2/5) present.  Psychiatric:        Mood and Affect: Mood normal.        Behavior: Behavior normal.     BP 97/62 (BP Location: Right Arm, Patient Position: Sitting, Cuff Size: Large)   Pulse (!) 59   Ht  6' (1.829 m)   SpO2 97%   BMI 34.58 kg/m  Wt Readings from Last 3 Encounters:  01/15/23 255 lb (115.7 kg)  10/21/22 255 lb 1.3 oz (115.7 kg)  04/30/22 240 lb (108.9 kg)    Lab Results  Component Value Date   TSH 4.020 03/12/2023   Lab Results  Component Value Date    WBC 9.5 04/25/2022   HGB 13.2 04/25/2022   HCT 39.5 04/25/2022   MCV 82.1 04/25/2022   PLT 173 04/25/2022   Lab Results  Component Value Date   NA 141 04/30/2022   K 4.6 04/30/2022   CO2 24 04/30/2022   GLUCOSE 42 (L) 04/30/2022   BUN 14 04/30/2022   CREATININE 1.44 (H) 04/30/2022   BILITOT 1.3 (H) 02/17/2022   ALKPHOS 123 02/17/2022   AST 23 02/17/2022   ALT 41 02/17/2022   PROT 7.9 02/17/2022   ALBUMIN 4.1 02/17/2022   CALCIUM 9.1 04/30/2022   ANIONGAP 7 04/25/2022   EGFR 56 (L) 04/30/2022   Lab Results  Component Value Date   CHOL 126 03/12/2023   Lab Results  Component Value Date   HDL 50 03/12/2023   Lab Results  Component Value Date   LDLCALC 60 03/12/2023   Lab Results  Component Value Date   TRIG 82 03/12/2023   Lab Results  Component Value Date   CHOLHDL 2.5 03/12/2023   Lab Results  Component Value Date   HGBA1C 5.9 (H) 03/12/2023      Assessment & Plan:   Problem List Items Addressed This Visit       Cardiovascular and Mediastinum   Essential hypertension    BP Readings from Last 1 Encounters:  07/16/23 97/62   Well-controlled with amlodipine and lisinopril Counseled for compliance with the medications Advised DASH diet        Endocrine   Hypothyroidism    Lab Results  Component Value Date   TSH 4.020 03/12/2023   On Levothyroxine 25 mcg QD Check TSH and free T4      Relevant Orders   TSH + free T4     Nervous and Auditory   Spastic hemiplegia affecting nondominant side (HCC) - Primary    CVA in 10/2020 and 01/2021, s/p thrombectomy Has residual left-sided hemiplegia and hemiparesis  Wheelchair-bound, ambulates short distances at home On aspirin, statin and Pradaxa 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 Has not seen cardiology in outpatient setting      Relevant Orders   Lipid panel     Genitourinary   BPH (benign prostatic hyperplasia)    On Flomax      Relevant Orders   PSA   Stage 3a chronic  kidney disease (HCC)    Last BMP showed GFR - 52 Proteinuria improving Followed by nephrology On ACEi Avoid nephrotoxic agents      Relevant Orders   VITAMIN D 25 Hydroxy (Vit-D Deficiency, Fractures)   CMP14+EGFR   CBC with Differential/Platelet     Other   Sickle cell trait (HCC)    Has borderline low Hb in the past He has CKD and also takes Pradaxa for history of DVT/PE and cryptogenic stroke Denies any active bleeding Check CBC      Prediabetes    Advised to follow low carb diet for now      Relevant Orders   Hemoglobin A1c   Mixed hyperlipidemia    On statin Check lipid profile      Relevant Orders   Lipid panel  Encounter for general adult medical examination with abnormal findings    Physical exam as documented. Fasting blood tests ordered. Advised to get Shingrix vaccines at local pharmacy.        Follow-up: Return in about 6 months (around 01/16/2024) for HTN and CKD.    Anabel Halon, MD

## 2023-08-14 LAB — CBC WITH DIFFERENTIAL/PLATELET
Basophils Absolute: 0 10*3/uL (ref 0.0–0.2)
Basos: 1 %
EOS (ABSOLUTE): 0 10*3/uL (ref 0.0–0.4)
Eos: 1 %
Hematocrit: 40.5 % (ref 37.5–51.0)
Hemoglobin: 13.5 g/dL (ref 13.0–17.7)
Immature Grans (Abs): 0 10*3/uL (ref 0.0–0.1)
Immature Granulocytes: 0 %
Lymphocytes Absolute: 1.5 10*3/uL (ref 0.7–3.1)
Lymphs: 37 %
MCH: 27.8 pg (ref 26.6–33.0)
MCHC: 33.3 g/dL (ref 31.5–35.7)
MCV: 84 fL (ref 79–97)
Monocytes Absolute: 0.4 10*3/uL (ref 0.1–0.9)
Monocytes: 9 %
Neutrophils Absolute: 2.1 10*3/uL (ref 1.4–7.0)
Neutrophils: 52 %
Platelets: 195 10*3/uL (ref 150–450)
RBC: 4.85 x10E6/uL (ref 4.14–5.80)
RDW: 13.1 % (ref 11.6–15.4)
WBC: 4.1 10*3/uL (ref 3.4–10.8)

## 2023-08-14 LAB — CMP14+EGFR
ALT: 24 IU/L (ref 0–44)
AST: 21 IU/L (ref 0–40)
Albumin: 4.1 g/dL (ref 3.8–4.9)
Alkaline Phosphatase: 155 IU/L — ABNORMAL HIGH (ref 44–121)
BUN/Creatinine Ratio: 10 (ref 10–24)
BUN: 14 mg/dL (ref 8–27)
Bilirubin Total: 0.9 mg/dL (ref 0.0–1.2)
CO2: 24 mmol/L (ref 20–29)
Calcium: 9.2 mg/dL (ref 8.6–10.2)
Chloride: 104 mmol/L (ref 96–106)
Creatinine, Ser: 1.42 mg/dL — ABNORMAL HIGH (ref 0.76–1.27)
Globulin, Total: 3.1 g/dL (ref 1.5–4.5)
Glucose: 89 mg/dL (ref 70–99)
Potassium: 3.6 mmol/L (ref 3.5–5.2)
Sodium: 143 mmol/L (ref 134–144)
Total Protein: 7.2 g/dL (ref 6.0–8.5)
eGFR: 57 mL/min/{1.73_m2} — ABNORMAL LOW (ref >59)

## 2023-08-14 LAB — LIPID PANEL
Chol/HDL Ratio: 2.4 ratio (ref 0.0–5.0)
Cholesterol, Total: 109 mg/dL (ref 100–199)
HDL: 46 mg/dL (ref >39)
LDL Chol Calc (NIH): 48 mg/dL (ref 0–99)
Triglycerides: 71 mg/dL (ref 0–149)
VLDL Cholesterol Cal: 15 mg/dL (ref 5–40)

## 2023-08-14 LAB — TSH+FREE T4
Free T4: 1.48 ng/dL (ref 0.82–1.77)
TSH: 3.63 u[IU]/mL (ref 0.450–4.500)

## 2023-08-14 LAB — VITAMIN D 25 HYDROXY (VIT D DEFICIENCY, FRACTURES): Vit D, 25-Hydroxy: 47.1 ng/mL (ref 30.0–100.0)

## 2023-08-14 LAB — HEMOGLOBIN A1C
Est. average glucose Bld gHb Est-mCnc: 128 mg/dL
Hgb A1c MFr Bld: 6.1 % — ABNORMAL HIGH (ref 4.8–5.6)

## 2023-08-14 LAB — PSA: Prostate Specific Ag, Serum: 0.5 ng/mL (ref 0.0–4.0)

## 2023-08-17 DIAGNOSIS — N1831 Chronic kidney disease, stage 3a: Secondary | ICD-10-CM | POA: Diagnosis not present

## 2023-08-17 DIAGNOSIS — I10 Essential (primary) hypertension: Secondary | ICD-10-CM | POA: Diagnosis not present

## 2023-08-17 DIAGNOSIS — E559 Vitamin D deficiency, unspecified: Secondary | ICD-10-CM | POA: Diagnosis not present

## 2023-08-17 DIAGNOSIS — R809 Proteinuria, unspecified: Secondary | ICD-10-CM | POA: Diagnosis not present

## 2023-08-22 ENCOUNTER — Other Ambulatory Visit: Payer: Self-pay | Admitting: Adult Health

## 2023-08-22 DIAGNOSIS — Z7901 Long term (current) use of anticoagulants: Secondary | ICD-10-CM

## 2023-08-27 ENCOUNTER — Other Ambulatory Visit: Payer: Self-pay | Admitting: Internal Medicine

## 2023-08-27 DIAGNOSIS — Z7901 Long term (current) use of anticoagulants: Secondary | ICD-10-CM

## 2023-08-27 DIAGNOSIS — N1831 Chronic kidney disease, stage 3a: Secondary | ICD-10-CM

## 2023-08-27 DIAGNOSIS — I10 Essential (primary) hypertension: Secondary | ICD-10-CM

## 2023-08-27 DIAGNOSIS — Z1211 Encounter for screening for malignant neoplasm of colon: Secondary | ICD-10-CM

## 2023-08-27 DIAGNOSIS — R7303 Prediabetes: Secondary | ICD-10-CM

## 2023-08-30 ENCOUNTER — Other Ambulatory Visit: Payer: Self-pay | Admitting: Internal Medicine

## 2023-08-30 DIAGNOSIS — R7303 Prediabetes: Secondary | ICD-10-CM

## 2023-08-30 DIAGNOSIS — Z7901 Long term (current) use of anticoagulants: Secondary | ICD-10-CM

## 2023-08-30 DIAGNOSIS — Z1211 Encounter for screening for malignant neoplasm of colon: Secondary | ICD-10-CM

## 2023-08-30 DIAGNOSIS — N1831 Chronic kidney disease, stage 3a: Secondary | ICD-10-CM

## 2023-08-30 DIAGNOSIS — I10 Essential (primary) hypertension: Secondary | ICD-10-CM

## 2023-08-30 DIAGNOSIS — E038 Other specified hypothyroidism: Secondary | ICD-10-CM

## 2023-08-31 ENCOUNTER — Ambulatory Visit: Payer: 59 | Admitting: Adult Health

## 2023-08-31 ENCOUNTER — Encounter: Payer: Self-pay | Admitting: Adult Health

## 2023-08-31 VITALS — BP 103/63 | HR 70 | Ht 71.5 in

## 2023-08-31 DIAGNOSIS — R569 Unspecified convulsions: Secondary | ICD-10-CM | POA: Diagnosis not present

## 2023-08-31 DIAGNOSIS — I63411 Cerebral infarction due to embolism of right middle cerebral artery: Secondary | ICD-10-CM | POA: Diagnosis not present

## 2023-08-31 DIAGNOSIS — I639 Cerebral infarction, unspecified: Secondary | ICD-10-CM

## 2023-08-31 MED ORDER — LEVETIRACETAM 500 MG PO TABS
500.0000 mg | ORAL_TABLET | Freq: Two times a day (BID) | ORAL | 3 refills | Status: DC
Start: 2023-08-31 — End: 2024-09-12

## 2023-08-31 NOTE — Progress Notes (Signed)
[facility] [address] [place]. [place] [address] [phone]       OFFICE FOLLOW UP NOTE  Mr. AMANTE KOSEK Date of Birth:  1961/04/07 Medical Record Number:  [id number]   Primary neurologist: Dr. Pearlean Brownie Referring MD:  Deatra Ina, PA-c  Reason for Referral:  Stroke  Chief Complaint  Patient presents with   Follow-up    Rm 8, here with nephew Alinda Money Pt is here for CVA follow up. Pt states  he has been stable, no seizure activity. No new concerns.       HPI:   Update 08/31/2023 JM: Patient returns for follow-up visit accompanied by his nephew.  Prior visit over 1.5 years ago.  Continue left spastic hemiparesis with some improvement since prior visit, ambulate short distance with cane. Use of w/c for long distance.  Denies any recent falls. Did receive Botox injection after prior visit but shortly after injection started to experienced spasms, he was concerned this was due to Botox and chose not to have any additional treatments. Still have increased tone RUE>RLE but not bothersome. Denies any seizure activity on Keppra 500 mg twice daily.  Compliant on stroke prevention medications.  Routinely follows with PCP for stroke risk factor management.    History provided for reference purposes only Update, 12/17/2021, JM: Mr Mazzara is here today for a stroke follow-up accompanied by his son. Overall stable without new stroke/TIA symptoms. 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  Ambulates short distance with cane and AFO brace, w/c for long distance. No recent falls.  Denies residual visual impairment. Completed therapy back in November as max rehab potential met per note review - he does question possibly returning. Considering pursuing botox with Dr. Wynn Banker - has f/u next month to further discuss.  He lives with his sister and she helps with his ADLs. Denies any seizure activity on Keppra 500 mg twice daily,  denies side effects.  Remains on Atorvastatin, Aspirin, and Pradaxa without side effects. BP today 118/64. Labs A1c 5.9 (02/03/2021), LDL 57 (09/24/2021).  No further concerns at this time  Initial visit 06/09/2021 Dr Pearlean Brownie: Mr. Kippen is a 62 year old African-American male seen today for initial office consultation visit for stroke.  He is accompanied by his son today.  History is obtained from them and review of electronic medical records and I personally reviewed pertinent available imaging films in PACS.  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  He presented on 02/02/2021 with sudden onset of left facial droop and left hemiparesis.  He was seen by telemetry neurology and CT angiogram showed right MCA M1 filling defect and perfusion deficit hence was transferred to [facility] for consideration for endovascular treatment.  CT perfusion showed 57 cc of penumbra Patient was taken for emergent thrombectomy with revascularization of occluded right MCA M1 segment with 2 passes of Solitaire stent retriever device and aspiration resulting in TICI 2 c revascularization.  He was kept in the ICU and blood pressure strictly monitored.  MRI scan showed acute large right MCA infarct with petechial hemorrhage with minimum leftward shift there were also small acute infarcts noted in the left cerebellum and left occipital lobe as well as left parietal and left frontal lobes but does not clear whether these were periprocedural.  2D echo done previously on 01/22/2021 had shown left ventricular ejection fraction  of 55 to 60% with no significant regional wall motion abnormalities.  There was  left atrial dilatation noted.  LDL cholesterol 25 mg percent and hemoglobin A1c was 5.9.  Patient had previous history of DVT and pulmonary embolism and lower extremity  venous ultrasound showed age-indeterminate determinate left femoral and popliteal thrombus.  Patient had been on Eliquis prior to this admission and it was switched to Pradaxa 150 twice daily and as daily was also continued.  Patient had the seizure-like activity noted during the hospitalization and even though EEG had no correlation there is twitchings were thought to be focal motor seizures which are difficult to capture an EEG and patient is started on Keppra.  Patient had a recent previous admission in February 2022 and MRI had shown right posterior temporal and parietal infarct in the inferior division MCA territory as well as acute infarction left cerebellum and right pontine tegmentum.  Previously TCD bubble study on 12/02/2020 was positive with delayed hits noted after Valsalva.  He had previously had a 30-day heart monitor in October 2021 which had not shown any significant atrial fibrillation has not arrhythmias.  Loop recorder was not placed since patient was already a candidate for long-term anticoagulation due to history of DVT and pulmonary embolism.    Patient was transferred to inpatient rehab where he made gradual improvement and is currently living at home.  He is getting outpatient therapy now and has finished home therapies.  Is able to walk with a walker with somebody has to be close to him.  He still has significant residual left hemiparesis he can barely extend his left elbow and move his left hip but continues to have left foot drop.  He is tolerating Pradaxa and aspirin without bruising or bleeding.  Blood pressure is under good control in fact recently tends to run low and today it is 103/70.  Is tolerating Lipitor well without muscle aches and pains.  He has had no new recurrent stroke symptoms and has no new complaints today.      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   Thyroid nodule 01/25/2021   Transient ischemic attack 09/07/2020    Social History:  Social History   Socioeconomic History   Marital status: Divorced    Spouse name: Not on file   Number of children: 2   Years of education: 12   Highest education level: Not on file  Occupational History   Occupation: disability    Comment: DVT  Tobacco Use  Smoking status: Former    Current packs/day: 1.00    Average packs/day: 1 pack/day for 39.7 years (39.7 ttl pk-yrs)    Types: Cigarettes    Start date: 12/01/1983   Smokeless tobacco: Never  Vaping Use   Vaping status: Never Used  Substance and Sexual Activity   Alcohol use: Not Currently    Alcohol/week: 3.0 standard drinks of alcohol    Types: 3 Cans of beer per week    Comment: has not drank sice 11/27/20   Drug use: Not Currently    Comment: REMOTE HISTORY, Quit in 2007   Sexual activity: Yes  Other Topics Concern   Not on file  Social History Narrative   Lives with sister   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   Right Handed    Drinks caffeine on occassion   Social  Determinants of Health   Financial Resource Strain: Low Risk  (10/15/2022)   Overall Financial Resource Strain (CARDIA)    Difficulty of Paying Living Expenses: Not hard at all  Food Insecurity: No Food Insecurity (10/15/2022)   Hunger Vital Sign    Worried About Running Out of Food in the Last Year: Never true    Ran Out of Food in the Last Year: Never true  Transportation Needs: No Transportation Needs (10/15/2022)   PRAPARE - Administrator, Civil Service (Medical): No    Lack of Transportation (Non-Medical): No  Physical Activity: Insufficiently Active (10/01/2021)   Exercise Vital Sign    Days of Exercise per Week: 5 days    Minutes of Exercise per Session: 20 min  Stress: No Stress Concern Present (10/15/2022)   [employer] of Occupational Health - Occupational Stress Questionnaire    Feeling of Stress : Not at all  Social Connections: Moderately Isolated (10/15/2022)   Social Connection and Isolation Panel [NHANES]    Frequency of Communication with Friends and Family: More than three times a week    Frequency of Social Gatherings with Friends and Family: Three times a week    Attends Religious Services: More than 4 times per year    Active Member of Clubs or Organizations: No    Attends Banker Meetings: Never    Marital Status: Divorced  Catering manager Violence: Not At Risk (10/01/2021)   Humiliation, Afraid, Rape, and Kick questionnaire    Fear of Current or Ex-Partner: No    Emotionally Abused: No    Physically Abused: No    Sexually Abused: No    Medications:   Current Outpatient Medications on File Prior to Visit  Medication Sig Dispense Refill   acetaminophen (TYLENOL) 325 MG tablet Take 2 tablets (650 mg total) by mouth every 4 (four) hours as needed for mild pain (or temp > 37.5 C (99.5 F)).     amLODipine (NORVASC) 5 MG tablet Take 1 tablet (5 mg total) by mouth daily. 90 tablet 3   aspirin EC 81 MG EC tablet Take 1 tablet (81 mg  total) by mouth daily. Swallow whole. 30 tablet 11   atorvastatin (LIPITOR) 40 MG tablet Take 1 tablet by mouth once daily 90 tablet 0   benzonatate (TESSALON) 200 MG capsule Take 1 capsule (200 mg total) by mouth 2 (two) times daily as needed for cough. 20 capsule 0   cetirizine (ZYRTEC) 10 MG tablet Take 1 tablet by mouth once daily 30 tablet 0   guaiFENesin (ROBITUSSIN) 100 MG/5ML liquid Take 5 mLs by mouth every 4 (four)  hours as needed for cough or to loosen phlegm. 120 mL 0   levETIRAcetam (KEPPRA) 500 MG tablet Take 1 tablet by mouth twice daily 60 tablet 0   levothyroxine (SYNTHROID) 25 MCG tablet Take 1 tablet by mouth once daily 90 tablet 0   lisinopril (ZESTRIL) 2.5 MG tablet Take 1 tablet by mouth once daily 90 tablet 0   pantoprazole (PROTONIX) 40 MG tablet Take 1 tablet (40 mg total) by mouth daily. 90 tablet 3   PRADAXA 150 MG CAPS capsule Take 1 capsule (150 mg total) by mouth every 12 (twelve) hours. 180 capsule 3   senna-docusate (SENOKOT-S) 8.6-50 MG tablet Take 1 tablet by mouth at bedtime.     tamsulosin (FLOMAX) 0.4 MG CAPS capsule TAKE 1 CAPSULE BY MOUTH ONCE DAILY AFTER SUPPER 90 capsule 3   No current facility-administered medications on file prior to visit.    Allergies:  No Known Allergies  Physical Exam Today's Vitals   08/31/23 1425  BP: 103/63  Pulse: 70  Height: 5' 11.5" (1.816 m)   Body mass index is 35.07 kg/m.   General: Mildly obese very pleasant middle-aged African-American male, seated, in no evident distress Head: head normocephalic and atraumatic.   Neck: supple with no carotid or supraclavicular bruits Cardiovascular: regular rate and rhythm, no murmurs Musculoskeletal: no deformity Skin:  no rash/petichiae Vascular:  Normal pulses all extremities  Neurologic Exam Mental Status: Awake and fully alert.  No evidence of aphasia or dysarthria.  Oriented to place and time. Recent and remote memory intact. Attention span, concentration and fund  of knowledge appropriate. Mood and affect appropriate.  Cranial Nerves: Pupils equal, briskly reactive to light. Extraocular movements full without nystagmus. Visual fields full to confrontation testing. Hearing intact. Facial sensation intact. Left lower facial weakness. Tongue and palate move normally and symmetrically.  Motor: Full strength right upper and lower extremity.   LUE: 2/5 deltoid, 4/5 elbow extension, 3/5 elbow flexion, decreased grip strength with increased tone, unable to make fist LLE: 3/5 HF, 4/5 knee extension and flexion, 2/5 ADF, slightly increased tone Sensory.:  Intact sensation to touch , pinprick , position and vibratory sensation.  Coordination: Rapid alternating movements normal on right side. Finger-to-nose and heel-to-shin performed accurately on right side. Gait and Station: Deferred as AD not present reflexes: 3+ LUE, 1+ LLE, RUE and RLE. Toes downgoing.       ASSESSMENT: 62 year old African-American male with recurrent right middle cerebral artery infarcts (10/2020, 12/2020 and 01/2021) treated with mechanical thrombectomy in March 2022 with residual spastic left hemiparesis.  He also had symptomatic focal motor seizures which are stable on the current medication regimen of Keppra.  Vascular risk factors of hypertension, hyperlipidemia , hypercoagulable state with BLE DVT and PE, smoking, obesity, sleep apnea, marijuana use, intracranial stenosis and multiple prior strokes   PLAN:   Ischemic stroke:  Residual deficit: Left spastic hemiparesis and gait impairment. Noted improvement on todays exam compared to prior exam in 11/2021. Continued use of cane for short distance, w/c for long distance. Reports spasms after botox in 12/2021, likely not SE from botox but patient not interested in any additional injections Continue Aspirin 81mg  daily, Pradaxa 150mg  BID and atorvastatin for secondary stroke prevention and history of hypercoagulable state, medications to be  managed by PCP or other specialties Discussed secondary stroke prevention measures and importance of close PCP follow up for aggressive stroke risk factor management including BP goal<130/90, HLD with LDL goal<70 and pre-DM with A1c.<7  Stroke labs  08/2023: A1c 6.1, LDL 48  Seizures with onset of stroke:  Continue Keppra 500mg  BID for seizure prophylaxis -refill provided Due to size of stroke and hx of prior strokes, he remains at risk of seizures therefore should remain on keppra at this time Discussed importance of avoiding seizure provoking triggers Advised to call with any seizure activity    Follow-up in 1 year or call earlier if needed   CC:  Anabel Halon, MD   I spent 30 minutes of face-to-face and non-face-to-face time with patient and nephew.  This included previsit chart review, lab review, study review, order entry, electronic health record documentation, patient and nephew education and discussion regarding above diagnoses and treatment plan and answered all other questions to patient satisfaction  Ihor Austin, [facility]  [facility] [address] [place], [place] [address]  Phone [phone] Fax [phone] Note: This document was prepared with digital dictation and possible smart phrase technology. Any transcriptional errors that result from this process are unintentional.

## 2023-08-31 NOTE — Patient Instructions (Signed)
Continue Keppra 500 mg twice daily for seizure prevention  Continue current stroke prevention medications for secondary stroke prevention managed by PCP  Continue to follow up with PCP regarding blood pressure and cholesterol management  Maintain strict control of hypertension with blood pressure goal below 130/90 and cholesterol with LDL cholesterol (bad cholesterol) goal below 70 mg/dL.   Signs of a Stroke? Follow the BEFAST method:  Balance Watch for a sudden loss of balance, trouble with coordination or vertigo Eyes Is there a sudden loss of vision in one or both eyes? Or double vision?  Face: Ask the person to smile. Does one side of the face droop or is it numb?  Arms: Ask the person to raise both arms. Does one arm drift downward? Is there weakness or numbness of a leg? Speech: Ask the person to repeat a simple phrase. Does the speech sound slurred/strange? Is the person confused ? Time: If you observe any of these signs, call 911.     Followup in the future with me in 1 year or call earlier if needed       Thank you for coming to see [place] at [facility]. I hope we have been able to provide you high quality care today.  You may receive a patient satisfaction survey over the next few weeks. We would appreciate your feedback and comments so that we may continue to improve ourselves and the health of our patients.

## 2023-09-05 ENCOUNTER — Other Ambulatory Visit: Payer: Self-pay | Admitting: Internal Medicine

## 2023-09-05 DIAGNOSIS — E038 Other specified hypothyroidism: Secondary | ICD-10-CM

## 2023-09-14 ENCOUNTER — Other Ambulatory Visit: Payer: Self-pay | Admitting: Internal Medicine

## 2023-09-14 DIAGNOSIS — N1831 Chronic kidney disease, stage 3a: Secondary | ICD-10-CM

## 2023-09-14 DIAGNOSIS — I10 Essential (primary) hypertension: Secondary | ICD-10-CM

## 2023-09-30 NOTE — Patient Instructions (Signed)

## 2023-10-01 ENCOUNTER — Ambulatory Visit: Payer: 59 | Admitting: Family Medicine

## 2023-10-01 ENCOUNTER — Encounter: Payer: Self-pay | Admitting: Family Medicine

## 2023-10-01 VITALS — BP 138/79 | HR 72 | Ht 71.5 in | Wt 266.0 lb

## 2023-10-01 DIAGNOSIS — L0291 Cutaneous abscess, unspecified: Secondary | ICD-10-CM | POA: Insufficient documentation

## 2023-10-01 DIAGNOSIS — L0201 Cutaneous abscess of face: Secondary | ICD-10-CM

## 2023-10-01 MED ORDER — PREDNISONE 20 MG PO TABS
20.0000 mg | ORAL_TABLET | Freq: Two times a day (BID) | ORAL | 0 refills | Status: AC
Start: 2023-10-01 — End: 2023-10-06

## 2023-10-01 MED ORDER — DOXYCYCLINE HYCLATE 100 MG PO TABS
100.0000 mg | ORAL_TABLET | Freq: Two times a day (BID) | ORAL | 0 refills | Status: DC
Start: 2023-10-01 — End: 2023-10-16

## 2023-10-01 NOTE — Assessment & Plan Note (Signed)
Prednisone 20 mg twice daily x 5 days Doxycyline 100 mg twice daily x 7 days  Advise keep the area clean by washing with warm water and mild soap. Apply a warm compress to the area for 10-15 minutes, 3-4 times a day, to help with drainage. Avoid squeezing or popping the abscess.

## 2023-10-06 ENCOUNTER — Other Ambulatory Visit: Payer: Self-pay

## 2023-10-07 ENCOUNTER — Telehealth: Payer: Self-pay | Admitting: *Deleted

## 2023-10-07 ENCOUNTER — Other Ambulatory Visit: Payer: Self-pay

## 2023-10-07 DIAGNOSIS — Q268 Other congenital malformations of great veins: Secondary | ICD-10-CM

## 2023-10-07 DIAGNOSIS — I639 Cerebral infarction, unspecified: Secondary | ICD-10-CM

## 2023-10-07 NOTE — Progress Notes (Signed)
  Care Coordination   Note   10/07/2023 Name: [name] MRN: [id number] DOB: Aug 12, 1961  Matthew Wise is a 62 y.o. year old male who sees Anabel Halon, MD for primary care. I reached out to [name] by phone today to offer care coordination services.  Mr. Seybold was given information about Care Coordination services today including:   The Care Coordination services include support from the care team which includes your Nurse Coordinator, Clinical Social Worker, or Pharmacist.  The Care Coordination team is here to help remove barriers to the health concerns and goals most important to you. Care Coordination services are voluntary, and the patient may decline or stop services at any time by request to their care team member.   Care Coordination Consent Status: Patient agreed to services and verbal consent obtained.   Follow up plan:  Telephone appointment with care coordination team member scheduled for:  10/12/23  Encounter Outcome:  Patient Scheduled  [facility] Coordination Care Guide  Direct Dial: [phone]

## 2023-10-08 ENCOUNTER — Other Ambulatory Visit: Payer: Self-pay

## 2023-10-08 ENCOUNTER — Emergency Department (HOSPITAL_COMMUNITY): Payer: 59

## 2023-10-08 ENCOUNTER — Inpatient Hospital Stay (HOSPITAL_COMMUNITY)
Admission: EM | Admit: 2023-10-08 | Discharge: 2023-10-16 | DRG: 398 | Disposition: A | Discharge status: Skilled Nursing Facility | Payer: 59 | Attending: Internal Medicine | Admitting: Internal Medicine

## 2023-10-08 ENCOUNTER — Encounter (HOSPITAL_COMMUNITY): Payer: Self-pay

## 2023-10-08 DIAGNOSIS — R7303 Prediabetes: Secondary | ICD-10-CM | POA: Diagnosis present

## 2023-10-08 DIAGNOSIS — R531 Weakness: Secondary | ICD-10-CM | POA: Diagnosis not present

## 2023-10-08 DIAGNOSIS — Z86718 Personal history of other venous thrombosis and embolism: Secondary | ICD-10-CM

## 2023-10-08 DIAGNOSIS — I69354 Hemiplegia and hemiparesis following cerebral infarction affecting left non-dominant side: Secondary | ICD-10-CM | POA: Diagnosis not present

## 2023-10-08 DIAGNOSIS — Z4682 Encounter for fitting and adjustment of non-vascular catheter: Secondary | ICD-10-CM | POA: Diagnosis not present

## 2023-10-08 DIAGNOSIS — K449 Diaphragmatic hernia without obstruction or gangrene: Secondary | ICD-10-CM | POA: Diagnosis not present

## 2023-10-08 DIAGNOSIS — Z79890 Hormone replacement therapy (postmenopausal): Secondary | ICD-10-CM | POA: Diagnosis not present

## 2023-10-08 DIAGNOSIS — E039 Hypothyroidism, unspecified: Secondary | ICD-10-CM | POA: Diagnosis present

## 2023-10-08 DIAGNOSIS — Z87891 Personal history of nicotine dependence: Secondary | ICD-10-CM

## 2023-10-08 DIAGNOSIS — N1832 Chronic kidney disease, stage 3b: Secondary | ICD-10-CM | POA: Diagnosis present

## 2023-10-08 DIAGNOSIS — Z825 Family history of asthma and other chronic lower respiratory diseases: Secondary | ICD-10-CM

## 2023-10-08 DIAGNOSIS — R509 Fever, unspecified: Secondary | ICD-10-CM | POA: Diagnosis not present

## 2023-10-08 DIAGNOSIS — K219 Gastro-esophageal reflux disease without esophagitis: Secondary | ICD-10-CM | POA: Diagnosis not present

## 2023-10-08 DIAGNOSIS — I6930 Unspecified sequelae of cerebral infarction: Secondary | ICD-10-CM

## 2023-10-08 DIAGNOSIS — E669 Obesity, unspecified: Secondary | ICD-10-CM | POA: Diagnosis present

## 2023-10-08 DIAGNOSIS — Z7982 Long term (current) use of aspirin: Secondary | ICD-10-CM

## 2023-10-08 DIAGNOSIS — K3533 Acute appendicitis with perforation and localized peritonitis, with abscess: Secondary | ICD-10-CM | POA: Diagnosis not present

## 2023-10-08 DIAGNOSIS — Z79899 Other long term (current) drug therapy: Secondary | ICD-10-CM

## 2023-10-08 DIAGNOSIS — E782 Mixed hyperlipidemia: Secondary | ICD-10-CM | POA: Diagnosis present

## 2023-10-08 DIAGNOSIS — K9189 Other postprocedural complications and disorders of digestive system: Secondary | ICD-10-CM | POA: Diagnosis not present

## 2023-10-08 DIAGNOSIS — Z8249 Family history of ischemic heart disease and other diseases of the circulatory system: Secondary | ICD-10-CM

## 2023-10-08 DIAGNOSIS — I959 Hypotension, unspecified: Secondary | ICD-10-CM | POA: Diagnosis not present

## 2023-10-08 DIAGNOSIS — R1312 Dysphagia, oropharyngeal phase: Secondary | ICD-10-CM | POA: Diagnosis not present

## 2023-10-08 DIAGNOSIS — K567 Ileus, unspecified: Secondary | ICD-10-CM | POA: Diagnosis not present

## 2023-10-08 DIAGNOSIS — I10 Essential (primary) hypertension: Secondary | ICD-10-CM | POA: Diagnosis present

## 2023-10-08 DIAGNOSIS — Z8261 Family history of arthritis: Secondary | ICD-10-CM

## 2023-10-08 DIAGNOSIS — R1310 Dysphagia, unspecified: Secondary | ICD-10-CM | POA: Diagnosis not present

## 2023-10-08 DIAGNOSIS — K3580 Unspecified acute appendicitis: Principal | ICD-10-CM | POA: Diagnosis present

## 2023-10-08 DIAGNOSIS — Z6835 Body mass index (BMI) 35.0-35.9, adult: Secondary | ICD-10-CM | POA: Diagnosis not present

## 2023-10-08 DIAGNOSIS — K224 Dyskinesia of esophagus: Secondary | ICD-10-CM | POA: Diagnosis present

## 2023-10-08 DIAGNOSIS — Z7902 Long term (current) use of antithrombotics/antiplatelets: Secondary | ICD-10-CM

## 2023-10-08 DIAGNOSIS — I129 Hypertensive chronic kidney disease with stage 1 through stage 4 chronic kidney disease, or unspecified chronic kidney disease: Secondary | ICD-10-CM | POA: Diagnosis not present

## 2023-10-08 DIAGNOSIS — R918 Other nonspecific abnormal finding of lung field: Secondary | ICD-10-CM | POA: Diagnosis not present

## 2023-10-08 DIAGNOSIS — M6281 Muscle weakness (generalized): Secondary | ICD-10-CM | POA: Diagnosis not present

## 2023-10-08 DIAGNOSIS — Z7901 Long term (current) use of anticoagulants: Secondary | ICD-10-CM | POA: Diagnosis not present

## 2023-10-08 DIAGNOSIS — R2681 Unsteadiness on feet: Secondary | ICD-10-CM | POA: Diagnosis not present

## 2023-10-08 DIAGNOSIS — Z7401 Bed confinement status: Secondary | ICD-10-CM | POA: Diagnosis not present

## 2023-10-08 DIAGNOSIS — Z86711 Personal history of pulmonary embolism: Secondary | ICD-10-CM

## 2023-10-08 DIAGNOSIS — Z808 Family history of malignant neoplasm of other organs or systems: Secondary | ICD-10-CM

## 2023-10-08 DIAGNOSIS — Z823 Family history of stroke: Secondary | ICD-10-CM

## 2023-10-08 DIAGNOSIS — Z833 Family history of diabetes mellitus: Secondary | ICD-10-CM

## 2023-10-08 DIAGNOSIS — N1831 Chronic kidney disease, stage 3a: Secondary | ICD-10-CM | POA: Diagnosis not present

## 2023-10-08 DIAGNOSIS — K859 Acute pancreatitis without necrosis or infection, unspecified: Secondary | ICD-10-CM | POA: Diagnosis present

## 2023-10-08 DIAGNOSIS — Z743 Need for continuous supervision: Secondary | ICD-10-CM | POA: Diagnosis not present

## 2023-10-08 DIAGNOSIS — R2689 Other abnormalities of gait and mobility: Secondary | ICD-10-CM | POA: Diagnosis not present

## 2023-10-08 DIAGNOSIS — I82221 Chronic embolism and thrombosis of inferior vena cava: Secondary | ICD-10-CM | POA: Diagnosis not present

## 2023-10-08 DIAGNOSIS — R935 Abnormal findings on diagnostic imaging of other abdominal regions, including retroperitoneum: Secondary | ICD-10-CM | POA: Diagnosis not present

## 2023-10-08 DIAGNOSIS — I639 Cerebral infarction, unspecified: Secondary | ICD-10-CM | POA: Diagnosis not present

## 2023-10-08 DIAGNOSIS — E876 Hypokalemia: Secondary | ICD-10-CM | POA: Diagnosis not present

## 2023-10-08 DIAGNOSIS — R5381 Other malaise: Secondary | ICD-10-CM | POA: Diagnosis not present

## 2023-10-08 DIAGNOSIS — R066 Hiccough: Secondary | ICD-10-CM | POA: Diagnosis not present

## 2023-10-08 DIAGNOSIS — N400 Enlarged prostate without lower urinary tract symptoms: Secondary | ICD-10-CM | POA: Diagnosis present

## 2023-10-08 DIAGNOSIS — Z860101 Personal history of adenomatous and serrated colon polyps: Secondary | ICD-10-CM | POA: Diagnosis not present

## 2023-10-08 LAB — URINALYSIS, W/ REFLEX TO CULTURE (INFECTION SUSPECTED)
Bacteria, UA: NONE SEEN
Bilirubin Urine: NEGATIVE
Glucose, UA: NEGATIVE mg/dL
Hgb urine dipstick: NEGATIVE
Ketones, ur: NEGATIVE mg/dL
Leukocytes,Ua: NEGATIVE
Nitrite: NEGATIVE
Protein, ur: NEGATIVE mg/dL
Specific Gravity, Urine: 1.011 (ref 1.005–1.030)
pH: 5 (ref 5.0–8.0)

## 2023-10-08 LAB — CBC WITH DIFFERENTIAL/PLATELET
Abs Immature Granulocytes: 0.07 10*3/uL (ref 0.00–0.07)
Basophils Absolute: 0 10*3/uL (ref 0.0–0.1)
Basophils Relative: 0 %
Eosinophils Absolute: 0 10*3/uL (ref 0.0–0.5)
Eosinophils Relative: 0 %
HCT: 44.4 % (ref 39.0–52.0)
Hemoglobin: 14.9 g/dL (ref 13.0–17.0)
Immature Granulocytes: 1 %
Lymphocytes Relative: 11 %
Lymphs Abs: 1.7 10*3/uL (ref 0.7–4.0)
MCH: 27.4 pg (ref 26.0–34.0)
MCHC: 33.6 g/dL (ref 30.0–36.0)
MCV: 81.8 fL (ref 80.0–100.0)
Monocytes Absolute: 0.9 10*3/uL (ref 0.1–1.0)
Monocytes Relative: 6 %
Neutro Abs: 12.7 10*3/uL — ABNORMAL HIGH (ref 1.7–7.7)
Neutrophils Relative %: 82 %
Platelets: 225 10*3/uL (ref 150–400)
RBC: 5.43 MIL/uL (ref 4.22–5.81)
RDW: 13.8 % (ref 11.5–15.5)
WBC: 15.4 10*3/uL — ABNORMAL HIGH (ref 4.0–10.5)
nRBC: 0.1 % (ref 0.0–0.2)

## 2023-10-08 LAB — COMPREHENSIVE METABOLIC PANEL
ALT: 54 U/L — ABNORMAL HIGH (ref 0–44)
AST: 25 U/L (ref 15–41)
Albumin: 3.6 g/dL (ref 3.5–5.0)
Alkaline Phosphatase: 105 U/L (ref 38–126)
Anion gap: 10 (ref 5–15)
BUN: 21 mg/dL — ABNORMAL HIGH (ref 6–20)
CO2: 26 mmol/L (ref 22–32)
Calcium: 8.6 mg/dL — ABNORMAL LOW (ref 8.9–10.3)
Chloride: 102 mmol/L (ref 98–111)
Creatinine, Ser: 1.45 mg/dL — ABNORMAL HIGH (ref 0.61–1.24)
GFR, Estimated: 55 mL/min — ABNORMAL LOW (ref >60)
Glucose, Bld: 117 mg/dL — ABNORMAL HIGH (ref 70–99)
Potassium: 3.2 mmol/L — ABNORMAL LOW (ref 3.5–5.1)
Sodium: 138 mmol/L (ref 135–145)
Total Bilirubin: 1.4 mg/dL — ABNORMAL HIGH (ref <1.2)
Total Protein: 7 g/dL (ref 6.5–8.1)

## 2023-10-08 LAB — RESP PANEL BY RT-PCR (RSV, FLU A&B, COVID)  RVPGX2
Influenza A by PCR: NEGATIVE
Influenza B by PCR: NEGATIVE
Resp Syncytial Virus by PCR: NEGATIVE
SARS Coronavirus 2 by RT PCR: NEGATIVE

## 2023-10-08 LAB — PROTIME-INR
INR: 1.4 — ABNORMAL HIGH (ref 0.8–1.2)
Prothrombin Time: 17.8 s — ABNORMAL HIGH (ref 11.4–15.2)

## 2023-10-08 LAB — APTT: aPTT: 35 s (ref 24–36)

## 2023-10-08 LAB — LACTIC ACID, PLASMA
Lactic Acid, Venous: 1.9 mmol/L (ref 0.5–1.9)
Lactic Acid, Venous: 1.5 mmol/L (ref 0.5–1.9)

## 2023-10-08 MED ORDER — PANTOPRAZOLE SODIUM 40 MG PO TBEC
40.0000 mg | DELAYED_RELEASE_TABLET | Freq: Every day | ORAL | Status: DC
  Administered 2023-10-08 – 2023-10-15 (×7): 40 mg via ORAL
  Filled 2023-10-08 (×7): qty 1

## 2023-10-08 MED ORDER — VANCOMYCIN HCL IN DEXTROSE 1-5 GM/200ML-% IV SOLN
1000.0000 mg | INTRAVENOUS | Status: AC
  Administered 2023-10-08 (×2): 1000 mg via INTRAVENOUS
  Filled 2023-10-08 (×2): qty 200

## 2023-10-08 MED ORDER — SODIUM CHLORIDE 0.9 % IV SOLN
2.0000 g | Freq: Once | INTRAVENOUS | Status: AC
  Administered 2023-10-08: 2 g via INTRAVENOUS
  Filled 2023-10-08: qty 12.5

## 2023-10-08 MED ORDER — FENTANYL CITRATE PF 50 MCG/ML IJ SOSY
50.0000 ug | PREFILLED_SYRINGE | Freq: Once | INTRAMUSCULAR | Status: AC
  Administered 2023-10-08: 50 ug via INTRAVENOUS
  Filled 2023-10-08: qty 1

## 2023-10-08 MED ORDER — MORPHINE SULFATE (PF) 2 MG/ML IV SOLN: 2.0000 mg | INTRAVENOUS | Status: DC | PRN

## 2023-10-08 MED ORDER — ACETAMINOPHEN 325 MG PO TABS
650.0000 mg | ORAL_TABLET | Freq: Once | ORAL | Status: AC
  Administered 2023-10-08: 650 mg via ORAL
  Filled 2023-10-08: qty 2

## 2023-10-08 MED ORDER — MORPHINE SULFATE (PF) 4 MG/ML IV SOLN: 4.0000 mg | Freq: Once | INTRAVENOUS | Status: DC

## 2023-10-08 MED ORDER — TAMSULOSIN HCL 0.4 MG PO CAPS
0.4000 mg | ORAL_CAPSULE | Freq: Every day | ORAL | Status: DC
  Administered 2023-10-09 – 2023-10-15 (×5): 0.4 mg via ORAL
  Filled 2023-10-08 (×6): qty 1

## 2023-10-08 MED ORDER — ONDANSETRON HCL 4 MG/2ML IJ SOLN
4.0000 mg | Freq: Four times a day (QID) | INTRAMUSCULAR | Status: DC | PRN
Start: 2023-10-08 — End: 2023-10-12
  Administered 2023-10-10 – 2023-10-12 (×5): 4 mg via INTRAVENOUS
  Filled 2023-10-08 (×4): qty 2

## 2023-10-08 MED ORDER — ALBUTEROL SULFATE (2.5 MG/3ML) 0.083% IN NEBU: 2.5000 mg | INHALATION_SOLUTION | RESPIRATORY_TRACT | Status: DC | PRN

## 2023-10-08 MED ORDER — ATORVASTATIN CALCIUM 40 MG PO TABS
40.0000 mg | ORAL_TABLET | Freq: Every day | ORAL | Status: DC
  Administered 2023-10-08 – 2023-10-15 (×7): 40 mg via ORAL
  Filled 2023-10-08 (×7): qty 1

## 2023-10-08 MED ORDER — VANCOMYCIN HCL IN DEXTROSE 1-5 GM/200ML-% IV SOLN: 1000.0000 mg | INTRAVENOUS | Status: DC

## 2023-10-08 MED ORDER — LEVOTHYROXINE SODIUM 25 MCG PO TABS
25.0000 ug | ORAL_TABLET | Freq: Every day | ORAL | Status: DC
  Administered 2023-10-09 – 2023-10-15 (×7): 25 ug via ORAL
  Filled 2023-10-08 (×7): qty 1

## 2023-10-08 MED ORDER — LACTATED RINGERS IV BOLUS
1000.0000 mL | Freq: Once | INTRAVENOUS | Status: AC
  Administered 2023-10-08: 1000 mL via INTRAVENOUS

## 2023-10-08 MED ORDER — POTASSIUM CHLORIDE 10 MEQ/100ML IV SOLN
10.0000 meq | INTRAVENOUS | Status: AC
Start: 2023-10-08 — End: 2023-10-08
  Administered 2023-10-08 (×3): 10 meq via INTRAVENOUS
  Filled 2023-10-08 (×3): qty 100

## 2023-10-08 MED ORDER — OXYCODONE HCL 5 MG PO TABS
5.0000 mg | ORAL_TABLET | ORAL | Status: DC | PRN
  Filled 2023-10-08: qty 1

## 2023-10-08 MED ORDER — ACETAMINOPHEN 650 MG RE SUPP: 650.0000 mg | Freq: Four times a day (QID) | RECTAL | Status: DC | PRN

## 2023-10-08 MED ORDER — LACTATED RINGERS IV SOLN: INTRAVENOUS | Status: AC

## 2023-10-08 MED ORDER — POTASSIUM CHLORIDE CRYS ER 20 MEQ PO TBCR
40.0000 meq | EXTENDED_RELEASE_TABLET | Freq: Once | ORAL | Status: AC
  Administered 2023-10-08: 40 meq via ORAL
  Filled 2023-10-08: qty 2

## 2023-10-08 MED ORDER — IOHEXOL 300 MG/ML  SOLN
100.0000 mL | Freq: Once | INTRAMUSCULAR | Status: AC | PRN
  Administered 2023-10-08: 100 mL via INTRAVENOUS

## 2023-10-08 MED ORDER — ONDANSETRON HCL 4 MG PO TABS: 4.0000 mg | ORAL_TABLET | Freq: Four times a day (QID) | ORAL | Status: DC | PRN

## 2023-10-08 MED ORDER — PIPERACILLIN-TAZOBACTAM 3.375 G IVPB
3.3750 g | Freq: Three times a day (TID) | INTRAVENOUS | Status: DC
  Administered 2023-10-08 – 2023-10-11 (×8): 3.375 g via INTRAVENOUS
  Filled 2023-10-08 (×8): qty 50

## 2023-10-08 MED ORDER — ASPIRIN 81 MG PO TBEC
81.0000 mg | DELAYED_RELEASE_TABLET | Freq: Every day | ORAL | Status: DC
  Administered 2023-10-11 – 2023-10-15 (×5): 81 mg via ORAL
  Filled 2023-10-08 (×5): qty 1

## 2023-10-08 MED ORDER — ACETAMINOPHEN 325 MG PO TABS
650.0000 mg | ORAL_TABLET | Freq: Four times a day (QID) | ORAL | Status: DC | PRN
  Administered 2023-10-09: 650 mg via ORAL
  Filled 2023-10-08: qty 2

## 2023-10-08 NOTE — ED Triage Notes (Signed)
Brought by EMS EMS BGL 128 120/74 76HR 100% RA   Woke up this morning weak and nauseous. Pt stated around 10:30 this morning temp was 101. Denies pain  Prior CVA with LEFT sided weakness.

## 2023-10-08 NOTE — Progress Notes (Signed)
Pharmacy Antibiotic Note  Matthew Wise is a 62 y.o. male admitted on 10/08/2023 with sepsis.  Pharmacy has been consulted for Vancomycin dosing.  Plan: Vancomycin 2000 mg IV Q 24 hrs. Goal AUC 400-550. Expected AUC: 476 SCr used: 1.45 F/U cxs and clinical progress Monitor V/S, labs and levels as indicated   Height: 6' (182.9 cm) Weight: 120.2 kg (265 lb) IBW/kg (Calculated) : 77.6  Temp (24hrs), Avg:100.4 F (38 C), Min:100.4 F (38 C), Max:100.4 F (38 C)  Recent Labs  Lab 10/08/23 1319  WBC 15.4*  CREATININE 1.45*  LATICACIDVEN 1.9    Estimated Creatinine Clearance: 72.5 mL/min (A) (by C-G formula based on SCr of 1.45 mg/dL (H)).    No Known Allergies  Antimicrobials this admission: Vancomycin  11/8 >>  Cefepime 11/8 in the ED   Microbiology results: 11/8 BCx: pending 11/8 UCx: pending   MRSA PCR:   Thank you for allowing pharmacy to be a part of this patient's care.  Elder Cyphers, BS Pharm D, BCPS Clinical Pharmacist 10/08/2023 3:00 PM

## 2023-10-08 NOTE — ED Provider Notes (Signed)
[place] EMERGENCY DEPARTMENT AT [facility] Provider Note   CSN: [id number] Arrival date & time: 10/08/23  1245     History  Chief Complaint  Patient presents with   Weakness    Matthew Wise is a 62 y.o. male.  He has PMH of CVA, left-sided hemiparesis, stage III CKD, presents to the ER today for evaluation of nausea and fever noted this morning woke up, denies cough, congestion, chest pain, shortness of breath, abdominal pain or vomiting but does state he had nausea this morning.  Denies dysuria.   Weakness      Home Medications Prior to Admission medications   Medication Sig Start Date End Date Taking? Authorizing Provider  acetaminophen (TYLENOL) 325 MG tablet Take 2 tablets (650 mg total) by mouth every 4 (four) hours as needed for mild pain (or temp > 37.5 C (99.5 F)). 03/11/21   Angiulli, Mcarthur Rossetti, PA-C  amLODipine (NORVASC) 5 MG tablet Take 1 tablet (5 mg total) by mouth daily. 01/15/23   Anabel Halon, MD  aspirin EC 81 MG EC tablet Take 1 tablet (81 mg total) by mouth daily. Swallow whole. 02/13/21   Bailey-Modzik, Delila A, NP  atorvastatin (LIPITOR) 40 MG tablet Take 1 tablet by mouth once daily 08/31/23   Anabel Halon, MD  benzonatate (TESSALON) 200 MG capsule Take 1 capsule (200 mg total) by mouth 2 (two) times daily as needed for cough. 01/15/23   Anabel Halon, MD  cetirizine (ZYRTEC) 10 MG tablet Take 1 tablet by mouth once daily 03/29/23   Anabel Halon, MD  doxycycline (VIBRA-TABS) 100 MG tablet Take 1 tablet (100 mg total) by mouth 2 (two) times daily for 7 days. 10/01/23 10/08/23  Del Nigel Berthold, FNP  guaiFENesin (ROBITUSSIN) 100 MG/5ML liquid Take 5 mLs by mouth every 4 (four) hours as needed for cough or to loosen phlegm. 01/05/23   Gilmore Laroche, FNP  levETIRAcetam (KEPPRA) 500 MG tablet Take 1 tablet (500 mg total) by mouth 2 (two) times daily. 08/31/23   Ihor Austin, NP  levothyroxine (SYNTHROID) 25 MCG tablet Take 1 tablet by  mouth once daily 09/06/23   Anabel Halon, MD  lisinopril (ZESTRIL) 2.5 MG tablet Take 1 tablet by mouth once daily 09/14/23   Anabel Halon, MD  pantoprazole (PROTONIX) 40 MG tablet Take 1 tablet (40 mg total) by mouth daily. 01/15/23   Anabel Halon, MD  PRADAXA 150 MG CAPS capsule Take 1 capsule (150 mg total) by mouth every 12 (twelve) hours. 01/15/23   Anabel Halon, MD  senna-docusate (SENOKOT-S) 8.6-50 MG tablet Take 1 tablet by mouth at bedtime. 02/12/21   Bailey-Modzik, Delila A, NP  tamsulosin (FLOMAX) 0.4 MG CAPS capsule TAKE 1 CAPSULE BY MOUTH ONCE DAILY AFTER SUPPER 01/15/23   Anabel Halon, MD  warfarin (COUMADIN) 5 MG tablet Take 5 mg by mouth one time only at 4 PM. 08/29/12   [provider]      Allergies    Patient has no known allergies.    Review of Systems   Review of Systems  Neurological:  Positive for weakness.    Physical Exam Updated Vital Signs BP (!) 98/57   Pulse 72   Temp 99.2 F (37.3 C) (Oral)   Resp 13   Ht 6' (1.829 m)   Wt 120.2 kg   SpO2 99%   BMI 35.94 kg/m  Physical Exam Vitals and nursing note reviewed.  Constitutional:  General: He is not in acute distress.    Appearance: He is well-developed.  HENT:     Head: Normocephalic and atraumatic.     Mouth/Throat:     Mouth: Mucous membranes are moist.  Eyes:     Extraocular Movements: Extraocular movements intact.     Conjunctiva/sclera: Conjunctivae normal.     Pupils: Pupils are equal, round, and reactive to light.  Cardiovascular:     Rate and Rhythm: Normal rate and regular rhythm.     Heart sounds: No murmur heard. Pulmonary:     Effort: Pulmonary effort is normal. No respiratory distress.     Breath sounds: Normal breath sounds.  Abdominal:     Palpations: Abdomen is soft.     Tenderness: There is abdominal tenderness.     Comments: Voluntary guarding right lower quadrant, tenderness right lower quadrant  Musculoskeletal:        General: No swelling. Normal  range of motion.     Cervical back: Neck supple.  Skin:    General: Skin is warm and dry.     Capillary Refill: Capillary refill takes less than 2 seconds.  Neurological:     General: No focal deficit present.     Mental Status: He is alert and oriented to person, place, and time.  Psychiatric:        Mood and Affect: Mood normal.     ED Results / Procedures / Treatments   Labs (all labs ordered are listed, but only abnormal results are displayed) Labs Reviewed  COMPREHENSIVE METABOLIC PANEL - Abnormal; Notable for the following components:      Result Value   Potassium 3.2 (*)    Glucose, Bld 117 (*)    BUN 21 (*)    Creatinine, Ser 1.45 (*)    Calcium 8.6 (*)    ALT 54 (*)    Total Bilirubin 1.4 (*)    GFR, Estimated 55 (*)    All other components within normal limits  CBC WITH DIFFERENTIAL/PLATELET - Abnormal; Notable for the following components:   WBC 15.4 (*)    Neutro Abs 12.7 (*)    All other components within normal limits  PROTIME-INR - Abnormal; Notable for the following components:   Prothrombin Time 17.8 (*)    INR 1.4 (*)    All other components within normal limits  RESP PANEL BY RT-PCR (RSV, FLU A&B, COVID)  RVPGX2  CULTURE, BLOOD (ROUTINE X 2)  CULTURE, BLOOD (ROUTINE X 2)  LACTIC ACID, PLASMA  LACTIC ACID, PLASMA  APTT  URINALYSIS, W/ REFLEX TO CULTURE (INFECTION SUSPECTED)    EKG None  Radiology CT CHEST ABDOMEN PELVIS W CONTRAST  Result Date: 10/08/2023 CLINICAL DATA:  Sepsis EXAM: CT CHEST, ABDOMEN, AND PELVIS WITH CONTRAST TECHNIQUE: Multidetector CT imaging of the chest, abdomen and pelvis was performed following the standard protocol during bolus administration of intravenous contrast. RADIATION DOSE REDUCTION: This exam was performed according to the departmental dose-optimization program which includes automated exposure control, adjustment of the mA and/or kV according to patient size and/or use of iterative reconstruction technique.  CONTRAST:  OMNIPAQUE IOHEXOL 300 MG/ML  SOLN COMPARISON:  CT angio chest 12/03/2020, CT abdomen pelvis 04/10/2016 FINDINGS: CT CHEST FINDINGS Cardiovascular: Normal heart size. No significant pericardial effusion. The thoracic aorta is normal in caliber. Mild atherosclerotic plaque of the thoracic aorta. No coronary artery calcifications. 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 The main  pulmonary artery is normal in caliber. No central pulmonary embolus. Limited evaluation more distally due to timing of contrast. Mediastinum/Nodes: No enlarged mediastinal, hilar, or axillary lymph nodes. Thyroid gland, trachea, and esophagus demonstrate no significant findings. Lungs/Pleura: No focal consolidation. No pulmonary nodule. No pulmonary mass. No pleural effusion. No pneumothorax. Musculoskeletal: Similar-appearing superficial subcutaneus soft tissue 1.7 cm fluid density lesion along the right axilla/chest wall likely representing a sebaceous cyst. No suspicious lytic or blastic osseous lesions. No acute displaced fracture. CT ABDOMEN PELVIS FINDINGS Hepatobiliary: No focal liver abnormality. No gallstones, gallbladder wall thickening, or pericholecystic fluid. No biliary dilatation. Pancreas: No focal lesion. Normal pancreatic contour. No surrounding inflammatory changes. No main pancreatic ductal dilatation. Spleen: Normal in size without focal abnormality. Adrenals/Urinary Tract: No adrenal nodule bilaterally. Bilateral kidneys enhance symmetrically. Fluid density lesions likely represent simple renal cysts. Simple renal cysts, in the absence of clinically indicated signs/symptoms, require no independent follow-up. No hydronephrosis. No hydroureter. The urinary bladder is unremarkable. Stomach/Bowel: Stomach is within normal limits. No evidence of bowel wall thickening or dilatation. The appendix is enlarged in caliber measuring up to  1.5 cm with associated appendiceal wall thickening and periappendiceal fat stranding. No definite appendicolith. 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 Multiple venous collaterals along the abdominal wall. No abdominal aorta or iliac aneurysm. Moderate atherosclerotic plaque of the aorta and its branches. No abdominal, pelvic, or inguinal lymphadenopathy. Reproductive: Prostate is unremarkable. Other: No intraperitoneal free fluid. No intraperitoneal free gas. No organized fluid collection. Musculoskeletal: Tiny fat containing umbilical hernia. Left gluteal cleft superficial subcutaneus soft tissue 2.7 cm fluid density lesion likely representing a sebaceous cyst. No suspicious lytic or blastic osseous lesions. No acute displaced fracture. Degenerative changes of the bilateral hips. IMPRESSION: 1. Acute non-perforated appendicitis (1.5 cm). No definite appendicoliths. 2. 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 3. No acute intrathoracic abnormality. Electronically Signed   By: Tish Frederickson M.D.   On: 10/08/2023 18:13   DG Chest Port 1 View  Result Date: 10/08/2023 CLINICAL DATA:  Fever.  Weakness. EXAM: PORTABLE CHEST 1 VIEW COMPARISON:  Chest radiograph dated Apr 24, 2022. FINDINGS: The heart size and mediastinal contours are within normal limits. Both lungs are clear. No pneumothorax or pleural effusion. No acute osseous abnormality. IMPRESSION: No acute cardiopulmonary findings. Electronically Signed   By: Hart Robinsons M.D.   On: 10/08/2023 16:24    Procedures Procedures    Medications Ordered in ED Medications  vancomycin (VANCOCIN) IVPB 1000 mg/200 mL premix (has no administration in time range)    And  vancomycin (VANCOCIN) IVPB 1000 mg/200 mL premix (has no administration in time range)  potassium chloride 10 mEq in 100 mL IVPB (10 mEq  Intravenous New Bag/Given 10/08/23 1906)  lactated ringers bolus 1,000 mL (0 mLs Intravenous Stopped 10/08/23 1546)  ceFEPIme (MAXIPIME) 2 g in sodium chloride 0.9 % 100 mL IVPB (0 g Intravenous Stopped 10/08/23 1558)  acetaminophen (TYLENOL) tablet 650 mg (650 mg Oral Given 10/08/23 1434)  vancomycin (VANCOCIN) IVPB 1000 mg/200 mL premix (0 mg Intravenous Stopped 10/08/23 1755)  iohexol (OMNIPAQUE) 300 MG/ML solution 100 mL (100 mLs Intravenous Contrast Given 10/08/23 1509)  fentaNYL (SUBLIMAZE) injection 50 mcg (50 mcg Intravenous Given 10/08/23 1901)  lactated ringers bolus 1,000 mL (1,000 mLs Intravenous New Bag/Given 10/08/23 1905)    ED Course/ Medical Decision Making/ A&P  Medical Decision Making This patient presents to the ED for concern of fever, nausea, generalized weakness, this involves an extensive number of treatment options, and is a complaint that carries with it a high risk of complications and morbidity.  The differential diagnosis includes pneumonia, sepsis, UTI, abdominal infection, viral illness, other   Co morbidities that complicate the patient evaluation :   prior CVA, seizure, stage 3 CKD   Additional history obtained:  Additional history obtained from EMR External records from outside source obtained and reviewed including notes, labs   Lab Tests:  I Ordered, and personally interpreted labs.  The pertinent results include: Lactic acid is normal x 2, UA is negative, INR slightly elevated at 1.4, CMP shows mild hypokalemia, baseline renal function   Imaging Studies ordered:  I ordered imaging studies including CT abdomen pelvis which shows elevated appendix and stranding in right lower quadrant suggestive of acute appendicitis I independently visualized and interpreted imaging within scope of identifying emergent findings  I agree with the radiologist interpretation   Cardiac Monitoring: / EKG:  The patient was maintained on a  cardiac monitor.  I personally viewed and interpreted the cardiac monitored which showed an underlying rhythm of: sinus rhythm   Consultations Obtained:  I requested consultation with the surgeon Dr. Robyne Peers,  and discussed lab and imaging findings as well as pertinent plan - they recommend: Hospital admission overnight, she will see patient in the morning and plan on taking to the OR in the morning.    Problem List / ED Course / Critical interventions / Medication management  Presented with soft blood pressure, fever, heart rate 94, noted to have leukocytosis, given empiric antibiotics for sepsis and given negative urine and chest x-ray CT abdomen pelvis ordered he was having some mild episodes morning and some mild tenderness initially right lower quadrant, CT read shows acute appendicitis, now more tenderness in the right lower quadrant with some voluntary guarding.  His abdomen is still soft.  fever controlled with Tylenol.  I discussed with on-call surgery as above, unfortunately patient did take his Pradaxa this morning.  They will plan to take him to the OR in the morning, discussed with patient the surgery will see him in the morning and discussed risks since he will be slightly high risk of bleeding due to his anticoagulant use.  Blood pressures slightly improved after fluids.  Pain controlled with fentanyl. I ordered medication including tylenol  for fever  Reevaluation of the patient after these medicines showed that the patient improved I have reviewed the patients home medicines and have made adjustments as needed   Amount and/or Complexity of Data Reviewed Labs: ordered. Radiology: ordered. ECG/medicine tests: ordered.  Risk OTC drugs. Prescription drug management.           Final Clinical Impression(s) / ED Diagnoses Final diagnoses:  Acute appendicitis, unspecified acute appendicitis type    Rx / DC Orders ED Discharge Orders     None         Ma Rings, PA-C 10/08/23 1911    Sloan Leiter, DO 10/09/23 2841

## 2023-10-08 NOTE — Progress Notes (Signed)
Pharmacy Antibiotic Note  Matthew Wise is a 62 y.o. male admitted on 10/08/2023 with sepsis.  Pharmacy has been consulted for Vancomycin and Zosyn dosing.  Plan: Continue Vancomycin 2000 mg IV Q 24 hrs. Goal AUC 400-550. Expected AUC: 476 SCr used: 1.45 Start Zosyn 3.375 g IV Q8H F/U cxs and clinical progress Monitor V/S, labs and levels as indicated   Height: 6' (182.9 cm) Weight: 120.2 kg (265 lb) IBW/kg (Calculated) : 77.6  Temp (24hrs), Avg:100 F (37.8 C), Min:99.2 F (37.3 C), Max:100.5 F (38.1 C)  Recent Labs  Lab 10/08/23 1319 10/08/23 1617  WBC 15.4*  --   CREATININE 1.45*  --   LATICACIDVEN 1.9 1.5    Estimated Creatinine Clearance: 72.5 mL/min (A) (by C-G formula based on SCr of 1.45 mg/dL (H)).    No Known Allergies  Antimicrobials this admission: Zosyn 11/8>> Vancomycin  11/8 >>  Cefepime 11/8 in the ED   Microbiology results: 11/8 BCx: pending 11/8 UCx: pending   MRSA PCR:   Thank you for allowing pharmacy to be a part of this patient's care.  Paulita Fujita, PharmD Clinical Pharmacist 10/08/2023 7:34 PM

## 2023-10-08 NOTE — ED Provider Notes (Incomplete)
CT on wet read appears to be acute appy, no free air, no obvious perf.   Formal rad read shows acute appy, uncomplicated  Abx in process  Not peritoneal  Pt will need to be admitted

## 2023-10-08 NOTE — H&P (Signed)
TRH H&P   Patient Demographics:    Matthew Wise, is a 62 y.o. male  MRN: [id number]   DOB - 10-18-1961  Admit Date - 10/08/2023  Outpatient Primary MD for the patient is Anabel Halon, MD  Referring MD/NP/PA: PA Celeste  Patient coming from: home  Chief Complaint  Patient presents with   Weakness      HPI:    Matthew Wise  is a 62 y.o. male,with past medical history of CVA, with residual left-sided weakness, PE-DVT, on Pradaxa, CKD stage IIIb, hypothyroidism, hypertension, hyperlipidemia, patient presents to ED secondary to weakness, nausea, abdominal pain, subjective fever, he denies cough, congestion, chest pain, or vomiting, no dysuria. -In ED he was noted to be tender in right lower quadrant, CT abdomen pelvis has been obtained which was significant for acute appendicitis, his workup significant For creatinine 1.4, at baseline, low sodium at 3.2, and elevated white blood cell count at 15.4, patient was started on IV antibiotics, ED discussed with general surgery, they will hold on surgery today given he took his Pradaxa this morning, and plan to go to the OR tomorrow, [facility] hospitalist consulted to admit  Review of systems:     A full 10 point Review of Systems was done, except as stated above, all other Review of Systems were negative.   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 two simple tubular adenomas removed. patient was advised to go to [facility] for colonoscopy but he did not keep appt.   IR CT HEAD LTD  02/02/2021   IR PERCUTANEOUS ART THROMBECTOMY/INFUSION INTRACRANIAL INC DIAG ANGIO  02/02/2021   RADIOLOGY WITH ANESTHESIA N/A 02/02/2021   Procedure: IR WITH ANESTHESIA;  Surgeon: Julieanne Cotton, MD;  Location: [facility] OR;  Service: Radiology;  Laterality: N/A;   TEE WITHOUT CARDIOVERSION N/A 09/10/2020   Procedure: TRANSESOPHAGEAL ECHOCARDIOGRAM (TEE) WITH PROPOFOL;  Surgeon: Jonelle Sidle, MD;  Location: [facility];  Service: Cardiovascular;  Laterality: N/A;      Social History:     Social History   Tobacco Use   Smoking status: Former    Current packs/day: 1.00    Average packs/day: 1 pack/day for 39.9 years (39.9 ttl pk-yrs)    Types: Cigarettes    Start date: 12/01/1983   Smokeless tobacco: Never  Substance Use Topics   Alcohol use: Not Currently    Alcohol/week: 3.0 standard drinks of alcohol    Types: 3 Cans of beer per week    Comment: has not drank sice 11/27/20        Family History :     Family History  Problem Relation Age of Onset   COPD Father    Stroke Brother    Pulmonary embolism Sister    Throat cancer Brother    Arthritis Mother    Diabetes Mother    Stroke Mother    Hypertension Brother    Diabetes Brother     Arthritis Brother    Diabetes Sister    Colon cancer Neg Hx       Home Medications:   Prior to Admission medications   Medication Sig Start Date End Date Taking? Authorizing Provider  amLODipine (NORVASC) 5 MG tablet Take 1 tablet (5 mg total) by mouth daily. 01/15/23  Yes Anabel Halon, MD  aspirin EC 81 MG EC tablet Take 1 tablet (81 mg total) by mouth daily. Swallow whole. 02/13/21  Yes Bailey-Modzik, Delila A, NP  atorvastatin (LIPITOR) 40 MG tablet Take 1 tablet by mouth once daily 08/31/23  Yes Patel, Earlie Lou, MD  doxycycline (VIBRA-TABS) 100 MG tablet Take 1 tablet (100 mg total) by mouth 2 (two) times daily for 7 days. 10/01/23 10/08/23 Yes Del Nigel Berthold, FNP  levETIRAcetam (KEPPRA) 500 MG tablet Take 1 tablet (500 mg total) by mouth 2 (two) times daily. 08/31/23  Yes McCue, Shanda Bumps, NP  levothyroxine (SYNTHROID) 25 MCG tablet Take 1 tablet by mouth once daily 09/06/23  Yes Anabel Halon, MD  lisinopril (ZESTRIL) 2.5 MG tablet Take 1 tablet by mouth once daily 09/14/23  Yes Anabel Halon, MD  Multiple Vitamin (MULTIVITAMIN) tablet Take 1 tablet by mouth daily.   Yes [provider]  pantoprazole (PROTONIX) 40 MG tablet Take 1 tablet (40 mg total) by mouth daily. 01/15/23  Yes Anabel Halon, MD  PRADAXA 150 MG CAPS capsule Take 1 capsule (150 mg total) by mouth every 12 (twelve) hours. 01/15/23  Yes Patel, Earlie Lou, MD  senna-docusate (SENOKOT-S) 8.6-50 MG tablet Take 1 tablet by mouth at bedtime. 02/12/21  Yes Bailey-Modzik, Delila A, NP  tamsulosin (FLOMAX) 0.4 MG CAPS capsule TAKE 1 CAPSULE BY MOUTH ONCE DAILY AFTER SUPPER 01/15/23  Yes Anabel Halon, MD     Allergies:    No Known Allergies   Physical Exam:   Vitals  Blood pressure 103/60, pulse 63, temperature 99.1 F (37.3 C), temperature source Oral, resp. rate 18, height 6' (1.829 m), weight 120.2 kg, SpO2 99%.   1. General Pale male, laying in bed, no apparent distress  2.,  Alert, oriented x  3, but slow to respond  3. No F.N deficits, ALL C.Nerves Intact, left upper extremity weakness with mild contraction  4. Ears and Eyes appear Normal, Conjunctivae clear, PERRLA. Moist  Oral Mucosa.  5. Supple Neck, No JVD, No cervical lymphadenopathy appriciated, No Carotid Bruits.  6. Symmetrical Chest wall movement, Good air movement bilaterally, CTAB.  7. RRR, No Gallops, Rubs or Murmurs, No Parasternal Heave.  8. Positive Bowel Sounds, Abdomen Soft, right lower quadrant tenderness to palpation  9.  No Cyanosis, Normal Skin Turgor, No Skin Rash or Bruise.  10. Good muscle tone,  joints appear normal , no effusions, Normal ROM.  11. No Palpable Lymph Nodes in Neck or Axillae     Data Review:    CBC Recent Labs  Lab 10/08/23 1319  WBC 15.4*  HGB 14.9  HCT 44.4  PLT 225  MCV 81.8  MCH 27.4  MCHC 33.6  RDW 13.8  LYMPHSABS 1.7  MONOABS 0.9  EOSABS 0.0  BASOSABS 0.0   ------------------------------------------------------------------------------------------------------------------  Chemistries  Recent Labs  Lab 10/08/23 1319  NA 138  K 3.2*  CL 102  CO2 26  GLUCOSE 117*  BUN 21*  CREATININE 1.45*  CALCIUM 8.6*  AST 25  ALT 54*  ALKPHOS 105  BILITOT 1.4*   ------------------------------------------------------------------------------------------------------------------ estimated creatinine clearance is 72.5 mL/min (A) (by C-G formula based on SCr of 1.45 mg/dL (H)). ------------------------------------------------------------------------------------------------------------------ No results for input(s): "TSH", "T4TOTAL", "T3FREE", "THYROIDAB" in the last 72 hours.  Invalid input(s): "FREET3"  Coagulation profile Recent Labs  Lab 10/08/23 1319  INR 1.4*   ------------------------------------------------------------------------------------------------------------------- No results for input(s): "DDIMER" in the last 72  hours. -------------------------------------------------------------------------------------------------------------------  Cardiac Enzymes No results for input(s): "CKMB", "TROPONINI", "MYOGLOBIN" in the last 168 hours.  Invalid input(s): "CK" ------------------------------------------------------------------------------------------------------------------ No results found for: "BNP"   ---------------------------------------------------------------------------------------------------------------  Urinalysis    Component Value Date/Time   COLORURINE YELLOW 10/08/2023 1500   APPEARANCEUR CLEAR 10/08/2023 1500   LABSPEC 1.011 10/08/2023 1500   PHURINE 5.0 10/08/2023 1500   GLUCOSEU NEGATIVE 10/08/2023 1500   HGBUR NEGATIVE 10/08/2023 1500   BILIRUBINUR NEGATIVE 10/08/2023 1500   KETONESUR NEGATIVE 10/08/2023 1500   PROTEINUR NEGATIVE 10/08/2023 1500   UROBILINOGEN >8.0 (H) 02/03/2015 1854   NITRITE NEGATIVE 10/08/2023 1500   LEUKOCYTESUR NEGATIVE 10/08/2023 1500    ----------------------------------------------------------------------------------------------------------------   Imaging Results:    CT CHEST ABDOMEN PELVIS W CONTRAST  Result Date: 10/08/2023 CLINICAL DATA:  Sepsis EXAM: CT CHEST, ABDOMEN, AND PELVIS WITH CONTRAST TECHNIQUE: Multidetector CT imaging of the chest, abdomen and pelvis was performed following the standard protocol during bolus administration of intravenous contrast. RADIATION DOSE REDUCTION: This exam was performed according to the departmental dose-optimization program which includes automated exposure control, adjustment of the mA and/or kV according to patient size and/or use of iterative reconstruction technique. CONTRAST:  OMNIPAQUE IOHEXOL 300 MG/ML  SOLN COMPARISON:  CT angio chest 12/03/2020, CT abdomen pelvis 04/10/2016 FINDINGS: CT CHEST FINDINGS Cardiovascular: Normal heart size. No significant pericardial effusion. The thoracic aorta is  normal in caliber. Mild atherosclerotic plaque of the thoracic aorta. No coronary artery calcifications. 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 The main pulmonary artery is normal in caliber. No central pulmonary embolus. Limited evaluation more distally due to timing of contrast. Mediastinum/Nodes: No enlarged mediastinal, hilar, or axillary lymph nodes. Thyroid gland, trachea, and esophagus demonstrate no significant findings. Lungs/Pleura: No focal consolidation. No pulmonary nodule. No pulmonary mass. No pleural effusion. No pneumothorax. Musculoskeletal: Similar-appearing superficial subcutaneus soft tissue 1.7 cm fluid density lesion along the right axilla/chest wall likely representing a sebaceous cyst. No suspicious lytic or blastic osseous  lesions. No acute displaced fracture. CT ABDOMEN PELVIS FINDINGS Hepatobiliary: No focal liver abnormality. No gallstones, gallbladder wall thickening, or pericholecystic fluid. No biliary dilatation. Pancreas: No focal lesion. Normal pancreatic contour. No surrounding inflammatory changes. No main pancreatic ductal dilatation. Spleen: Normal in size without focal abnormality. Adrenals/Urinary Tract: No adrenal nodule bilaterally. Bilateral kidneys enhance symmetrically. Fluid density lesions likely represent simple renal cysts. Simple renal cysts, in the absence of clinically indicated signs/symptoms, require no independent follow-up. No hydronephrosis. No hydroureter. The urinary bladder is unremarkable. Stomach/Bowel: Stomach is within normal limits. No evidence of bowel wall thickening or dilatation. The appendix is enlarged in caliber measuring up to 1.5 cm with associated appendiceal wall thickening and periappendiceal fat stranding. No definite appendicolith. 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 Multiple venous collaterals along the  abdominal wall. No abdominal aorta or iliac aneurysm. Moderate atherosclerotic plaque of the aorta and its branches. No abdominal, pelvic, or inguinal lymphadenopathy. Reproductive: Prostate is unremarkable. Other: No intraperitoneal free fluid. No intraperitoneal free gas. No organized fluid collection. Musculoskeletal: Tiny fat containing umbilical hernia. Left gluteal cleft superficial subcutaneus soft tissue 2.7 cm fluid density lesion likely representing a sebaceous cyst. No suspicious lytic or blastic osseous lesions. No acute displaced fracture. Degenerative changes of the bilateral hips. IMPRESSION: 1. Acute non-perforated appendicitis (1.5 cm). No definite appendicoliths. 2. 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 3. No acute intrathoracic abnormality. Electronically Signed   By: Tish Frederickson M.D.   On: 10/08/2023 18:13   DG Chest Port 1 View  Result Date: 10/08/2023 CLINICAL DATA:  Fever.  Weakness. EXAM: PORTABLE CHEST 1 VIEW COMPARISON:  Chest radiograph dated Apr 24, 2022. FINDINGS: The heart size and mediastinal contours are within normal limits. Both lungs are clear. No pneumothorax or pleural effusion. No acute osseous abnormality. IMPRESSION: No acute cardiopulmonary findings. Electronically Signed   By: Hart Robinsons M.D.   On: 10/08/2023 16:24     EKG:  Vent. rate 80 BPM PR interval 120 ms QRS duration 99 ms QT/QTcB 391/451 ms P-R-T axes 154 146 152 Right and left arm electrode reversal, interpretation assumes no reversal Sinus or ectopic atrial rhythm Multiple premature complexes, vent & supraven Right axis deviation Nonspecific T abnormalities, lateral leads  Assessment & Plan:    Principal Problem:   Acute pancreatitis Active Problems:   Essential hypertension   Hypothyroidism   Hemiparesis affecting left side as late effect of stroke (HCC)   BPH (benign prostatic  hyperplasia)   History of CVA with residual deficit   Gastroesophageal reflux disease   Acute appendicitis    Acute appendicitis -Presents with abdominal pain, nausea, fever, CT abdomen pelvis significant for acute appendicitis -Continue with IV Zosyn, continue IV vancomycin, follow-up blood cultures -Surgery input greatly appreciated, plan to the OR tomorrow-continue with IV fluids, as needed pain meds  Hypertension> hypotension  -His blood pressure is on the lower side, responded well to IV fluids, as discussed with the patient reports his blood pressure has been on the lower side for few weeks now, so I will DC all his antihypertensive medications, continue with IV fluids, and will reassess after surgery   Obesity last 2 Body mass index is 35.94 kg/m.  History of CVA with residual left-sided deficits -Continue with aspirin, to start postoperatively, continue with statin  -Patient appears to be on Pradaxa, but I think this is most likely due  to his history of DVT .  History of DVT -Pradaxa in anticipation of surgery  Hypokalemia -Repleted, recheck in a.m.   Hypothyroidism - Continue the use of Synthroid.   Mixed hyperlipidemia - Continue the use of his statins.    Chronic kidney disease, stage 3 B -At baseline, continue with IV fluids   DVT Prophylaxis on : Resume Pradaxa once  AM Labs Ordered, also please review Full Orders  Family Communication: Admission, patients condition and plan of care including tests being ordered have been discussed with the patient and sister at bedside who indicate understanding and agree with the plan and Code Status.  Code Status full code  Likely DC to home  Consults called: General Surgery consulted by ED  Admission status: Inpatient  Time spent in minutes : 70 minutes   Huey Bienenstock M.D on 10/08/2023 at 8:50 PM

## 2023-10-09 ENCOUNTER — Encounter (HOSPITAL_COMMUNITY): Payer: Self-pay | Admitting: Internal Medicine

## 2023-10-09 ENCOUNTER — Inpatient Hospital Stay (HOSPITAL_COMMUNITY): Payer: 59 | Admitting: Anesthesiology

## 2023-10-09 ENCOUNTER — Other Ambulatory Visit: Payer: Self-pay

## 2023-10-09 ENCOUNTER — Encounter (HOSPITAL_COMMUNITY)
Admission: EM | Disposition: A | Discharge status: Skilled Nursing Facility | Payer: Self-pay | Source: Home / Self Care | Attending: Family Medicine

## 2023-10-09 DIAGNOSIS — K3580 Unspecified acute appendicitis: Secondary | ICD-10-CM | POA: Diagnosis not present

## 2023-10-09 DIAGNOSIS — I10 Essential (primary) hypertension: Secondary | ICD-10-CM | POA: Diagnosis not present

## 2023-10-09 DIAGNOSIS — I6930 Unspecified sequelae of cerebral infarction: Secondary | ICD-10-CM | POA: Diagnosis not present

## 2023-10-09 HISTORY — PX: XI ROBOTIC LAPAROSCOPIC ASSISTED APPENDECTOMY: SHX6877

## 2023-10-09 LAB — HIV ANTIBODY (ROUTINE TESTING W REFLEX): HIV Screen 4th Generation wRfx: NONREACTIVE

## 2023-10-09 LAB — BASIC METABOLIC PANEL
Anion gap: 5 (ref 5–15)
BUN: 17 mg/dL (ref 6–20)
CO2: 27 mmol/L (ref 22–32)
Calcium: 8.4 mg/dL — ABNORMAL LOW (ref 8.9–10.3)
Chloride: 105 mmol/L (ref 98–111)
Creatinine, Ser: 1.35 mg/dL — ABNORMAL HIGH (ref 0.61–1.24)
GFR, Estimated: >60 mL/min (ref >60)
Glucose, Bld: 102 mg/dL — ABNORMAL HIGH (ref 70–99)
Potassium: 3.4 mmol/L — ABNORMAL LOW (ref 3.5–5.1)
Sodium: 137 mmol/L (ref 135–145)

## 2023-10-09 LAB — CBC
HCT: 38.9 % — ABNORMAL LOW (ref 39.0–52.0)
Hemoglobin: 13.3 g/dL (ref 13.0–17.0)
MCH: 27.9 pg (ref 26.0–34.0)
MCHC: 34.2 g/dL (ref 30.0–36.0)
MCV: 81.6 fL (ref 80.0–100.0)
Platelets: 200 10*3/uL (ref 150–400)
RBC: 4.77 MIL/uL (ref 4.22–5.81)
RDW: 13.7 % (ref 11.5–15.5)
WBC: 16.4 10*3/uL — ABNORMAL HIGH (ref 4.0–10.5)
nRBC: 0 % (ref 0.0–0.2)

## 2023-10-09 LAB — SURGICAL PCR SCREEN
MRSA, PCR: NEGATIVE
Staphylococcus aureus: NEGATIVE

## 2023-10-09 SURGERY — APPENDECTOMY, ROBOT-ASSISTED, LAPAROSCOPIC
Anesthesia: General

## 2023-10-09 MED ORDER — MIDAZOLAM HCL 5 MG/5ML IJ SOLN
INTRAMUSCULAR | Status: DC | PRN
  Administered 2023-10-09: 2 mg via INTRAVENOUS

## 2023-10-09 MED ORDER — MIDAZOLAM HCL 2 MG/2ML IJ SOLN
INTRAMUSCULAR | Status: AC
  Filled 2023-10-09: qty 2

## 2023-10-09 MED ORDER — FENTANYL CITRATE PF 50 MCG/ML IJ SOSY: 25.0000 ug | PREFILLED_SYRINGE | INTRAMUSCULAR | Status: DC | PRN

## 2023-10-09 MED ORDER — ROCURONIUM BROMIDE 100 MG/10ML IV SOLN
INTRAVENOUS | Status: DC | PRN
  Administered 2023-10-09: 10 mg via INTRAVENOUS
  Administered 2023-10-09: 50 mg via INTRAVENOUS

## 2023-10-09 MED ORDER — ONDANSETRON HCL 4 MG/2ML IJ SOLN: 4.0000 mg | Freq: Once | INTRAMUSCULAR | Status: DC | PRN

## 2023-10-09 MED ORDER — PROPOFOL 10 MG/ML IV BOLUS
INTRAVENOUS | Status: DC | PRN
  Administered 2023-10-09: 200 mg via INTRAVENOUS

## 2023-10-09 MED ORDER — FENTANYL CITRATE (PF) 100 MCG/2ML IJ SOLN
INTRAMUSCULAR | Status: DC | PRN
  Administered 2023-10-09: 50 ug via INTRAVENOUS
  Administered 2023-10-09: 100 ug via INTRAVENOUS

## 2023-10-09 MED ORDER — BUPIVACAINE HCL (PF) 0.5 % IJ SOLN
INTRAMUSCULAR | Status: DC | PRN
  Administered 2023-10-09: 30 mL

## 2023-10-09 MED ORDER — ONDANSETRON HCL 4 MG/2ML IJ SOLN
INTRAMUSCULAR | Status: DC | PRN
  Administered 2023-10-09: 4 mg via INTRAVENOUS

## 2023-10-09 MED ORDER — ACETAMINOPHEN 500 MG PO TABS
1000.0000 mg | ORAL_TABLET | Freq: Four times a day (QID) | ORAL | Status: DC
  Administered 2023-10-09 – 2023-10-15 (×11): 1000 mg via ORAL
  Filled 2023-10-09 (×12): qty 2

## 2023-10-09 MED ORDER — PROPOFOL 10 MG/ML IV BOLUS
INTRAVENOUS | Status: AC
  Filled 2023-10-09: qty 20

## 2023-10-09 MED ORDER — FENTANYL CITRATE (PF) 100 MCG/2ML IJ SOLN
INTRAMUSCULAR | Status: AC
  Filled 2023-10-09: qty 2

## 2023-10-09 MED ORDER — BUPIVACAINE HCL (PF) 0.5 % IJ SOLN
INTRAMUSCULAR | Status: AC
  Filled 2023-10-09: qty 30

## 2023-10-09 MED ORDER — STERILE WATER FOR IRRIGATION IR SOLN
Status: DC | PRN
  Administered 2023-10-09: 1000 mL

## 2023-10-09 MED ORDER — LACTATED RINGERS IV SOLN: INTRAVENOUS | Status: DC | PRN

## 2023-10-09 MED ORDER — ONDANSETRON HCL 4 MG/2ML IJ SOLN
INTRAMUSCULAR | Status: AC
  Filled 2023-10-09: qty 2

## 2023-10-09 MED ORDER — EPHEDRINE SULFATE-NACL 50-0.9 MG/10ML-% IV SOSY
PREFILLED_SYRINGE | INTRAVENOUS | Status: DC | PRN
Start: 2023-10-09 — End: 2023-10-09
  Administered 2023-10-09: 10 mg via INTRAVENOUS

## 2023-10-09 SURGICAL SUPPLY — 49 items
CHLORAPREP W/TINT 26 (MISCELLANEOUS) ×1 IMPLANT
COVER MAYO STAND XLG (MISCELLANEOUS) ×1 IMPLANT
COVER TIP SHEARS 8 DVNC (MISCELLANEOUS) IMPLANT
DEFOGGER SCOPE WARMER CLEARIFY (MISCELLANEOUS) ×1 IMPLANT
DERMABOND ADVANCED .7 DNX12 (GAUZE/BANDAGES/DRESSINGS) ×1 IMPLANT
DERMABOND ADVANCED .7 DNX6 (GAUZE/BANDAGES/DRESSINGS) IMPLANT
DRAPE ARM DVNC X/XI (DISPOSABLE) ×3 IMPLANT
DRAPE COLUMN DVNC XI (DISPOSABLE) ×1 IMPLANT
FORCEPS BPLR R/ABLATION 8 DVNC (INSTRUMENTS) ×1 IMPLANT
GLOVE BIOGEL PI IND STRL 6.5 (GLOVE) ×2 IMPLANT
GLOVE BIOGEL PI IND STRL 7.0 (GLOVE) ×3 IMPLANT
GLOVE SURG SS PI 6.5 STRL IVOR (GLOVE) ×2 IMPLANT
GOWN STRL REUS W/TWL LRG LVL3 (GOWN DISPOSABLE) ×3 IMPLANT
GRASPER SUT TROCAR 14GX15 (MISCELLANEOUS) ×1 IMPLANT
GRASPER TIP-UP FEN DVNC XI (INSTRUMENTS) IMPLANT
IRRIGATOR SUCT 8 DISP DVNC XI (IRRIGATION / IRRIGATOR) IMPLANT
IV NS IRRIG 3000ML ARTHROMATIC (IV SOLUTION) IMPLANT
KIT PINK PAD W/HEAD ARE REST (MISCELLANEOUS) ×1 IMPLANT
KIT PINK PAD W/HEAD ARM REST (MISCELLANEOUS) ×1 IMPLANT
KIT TURNOVER KIT A (KITS) ×1 IMPLANT
MANIFOLD NEPTUNE II (INSTRUMENTS) ×1 IMPLANT
NDL HYPO 21X1.5 SAFETY (NEEDLE) ×1 IMPLANT
NDL INSUFFLATION 14GA 120MM (NEEDLE) ×1 IMPLANT
NEEDLE HYPO 21X1.5 SAFETY (NEEDLE) ×1 IMPLANT
NEEDLE INSUFFLATION 14GA 120MM (NEEDLE) ×1 IMPLANT
OBTURATOR OPTICAL STND 8 DVNC (TROCAR) ×1
OBTURATOR OPTICALSTD 8 DVNC (TROCAR) ×1 IMPLANT
PACK LAP CHOLE LZT030E (CUSTOM PROCEDURE TRAY) ×1 IMPLANT
PENCIL HANDSWITCHING (ELECTRODE) ×1 IMPLANT
RELOAD STAPLE 45 2.5 WHT DVNC (STAPLE) IMPLANT
RELOAD STAPLER 2.5X45 WHT DVNC (STAPLE) ×1 IMPLANT
SCISSORS MNPLR CVD DVNC XI (INSTRUMENTS) IMPLANT
SEAL UNIV 5-12 XI (MISCELLANEOUS) ×4 IMPLANT
SEALER VESSEL EXT DVNC XI (MISCELLANEOUS) ×1 IMPLANT
SET BASIN LINEN APH (SET/KITS/TRAYS/PACK) ×1 IMPLANT
SET TUBE SMOKE EVAC HIGH FLOW (TUBING) ×1 IMPLANT
STAPLER 45 SUREFORM DVNC (STAPLE) ×1 IMPLANT
STAPLER RELOAD 2.5X45 WHT DVNC (STAPLE) ×1
SUT MNCRL AB 4-0 PS2 18 (SUTURE) ×1 IMPLANT
SUT SILK 2 0 (SUTURE) ×1
SUT SILK 2-0 18XBRD TIE 12 (SUTURE) ×1 IMPLANT
SUT VICRYL 0 AB UR-6 (SUTURE) ×1 IMPLANT
SYR 30ML LL (SYRINGE) ×1 IMPLANT
SYS RETRIEVAL 5MM INZII UNIV (BASKET) ×1
SYSTEM RETRIEVL 5MM INZII UNIV (BASKET) ×1 IMPLANT
TAPE TRANSPORE STRL 2 31045 (GAUZE/BANDAGES/DRESSINGS) ×1 IMPLANT
TRAY FOLEY W/BAG SLVR 16FR (SET/KITS/TRAYS/PACK) ×1
TRAY FOLEY W/BAG SLVR 16FR ST (SET/KITS/TRAYS/PACK) ×1 IMPLANT
WATER STERILE IRR 500ML POUR (IV SOLUTION) ×1 IMPLANT

## 2023-10-09 NOTE — Anesthesia Preprocedure Evaluation (Signed)
Anesthesia Evaluation  Patient identified by MRN, date of birth, ID band Patient awake    Reviewed: Allergy & Precautions, H&P , NPO status , Patient's Chart, lab work & pertinent test results, reviewed documented beta blocker date and time   Airway Mallampati: II  TM Distance: >3 FB Neck ROM: full    Dental no notable dental hx.    Pulmonary neg pulmonary ROS, Patient abstained from smoking., former smoker   Pulmonary exam normal breath sounds clear to auscultation       Cardiovascular Exercise Tolerance: Good hypertension, negative cardio ROS  Rhythm:regular Rate:Normal     Neuro/Psych Seizures -,  CVA, Residual Symptoms negative neurological ROS  negative psych ROS   GI/Hepatic negative GI ROS, Neg liver ROS,GERD  ,,  Endo/Other  negative endocrine ROSHypothyroidism    Renal/GU Renal diseasenegative Renal ROS  negative genitourinary   Musculoskeletal   Abdominal   Peds  Hematology negative hematology ROS (+)   Anesthesia Other Findings   Reproductive/Obstetrics negative OB ROS                             Anesthesia Physical Anesthesia Plan  ASA: 3  Anesthesia Plan: General and General ETT   Post-op Pain Management:    Induction:   PONV Risk Score and Plan: Ondansetron  Airway Management Planned:   Additional Equipment:   Intra-op Plan:   Post-operative Plan:   Informed Consent: I have reviewed the patients History and Physical, chart, labs and discussed the procedure including the risks, benefits and alternatives for the proposed anesthesia with the patient or authorized representative who has indicated his/her understanding and acceptance.     Dental Advisory Given  Plan Discussed with: CRNA  Anesthesia Plan Comments:        Anesthesia Quick Evaluation

## 2023-10-09 NOTE — Discharge Instructions (Signed)
Surgery Discharge Instructions  Activity  You are advised to go directly home from the hospital.  Resume light activity. No heavy lifting over 10 lbs or strenuous exercise.  Fluids and Diet Regular diet  Medications  If you have not had a bowel movement in 24 hours, take 2 tablespoons over the counter Milk of mag.             You May resume your blood thinners tomorrow (Aspirin, coumadin, or other).  You are being discharged with prescriptions for Opioid/Narcotic Medications: There are some specific considerations for these medications that you should know. Opioid Meds have risks & benefits. Addiction to these meds is always a concern with prolonged use Take medication only as directed Do not drive while taking narcotic pain medication Do not crush tablets or capsules Do not use a different container than medication was dispensed in Lock the container of medication in a cool, dry place out of reach of children and pets. Opioid medication can cause addiction Do not share with anyone else (this is a felony) Do not store medications for future use. Dispose of them properly.     Disposal:  Find a [employer] household drug take back site near you.  If you can't get to a drug take back site, use the recipe below as a last resort to dispose of expired, unused or unwanted drugs. Disposal  (Do not dispose chemotherapy drugs this way, talk to your prescribing doctor instead.) Step 1: Mix drugs (do not crush) with dirt, kitty litter, or used coffee grounds and add a small amount of water to dissolve any solid medications. Step 2: Seal drugs in plastic bag. Step 3: Place plastic bag in trash. Step 4: Take prescription container and scratch out personal information, then recycle or throw away.  Operative Site  You have a liquid bandage over your incisions, this will begin to flake off in about a week. Ok to Games developer. Keep wound clean and dry. No baths or swimming. No lifting more than 10  pounds.  Contact Information: If you have questions or concerns, please call our office, [phone], Monday- Thursday 8AM-5PM and Friday 8AM-12Noon.  If it is after hours or on the weekend, please call [facility] Main Number, [phone], and ask to speak to the surgeon on call for Dr. Okey Dupre at [facility].   SPECIFIC COMPLICATIONS TO WATCH FOR: Inability to urinate Fever over 101? F by mouth Nausea and vomiting lasting longer than 24 hours. Pain not relieved by medication ordered Swelling around the operative site Increased redness, warmth, hardness, around operative area Numbness, tingling, or cold fingers or toes Blood -soaked dressing, (small amounts of oozing may be normal) Increasing and progressive drainage from surgical area or exam site

## 2023-10-09 NOTE — Progress Notes (Signed)
[facility]  Spoke with the patient's sister on the phone.  I explained that he tolerated the procedure without difficulty.  He has dissolvable stitches under the skin with overlying skin glue.  This will flake off in 10 to 14 days.  He will return to his room on the floor and we will see how he progress.  I anticipate that he will be discharged home later today vs tomorrow pending how he progresses. The patient will follow-up with me in 2 weeks.  All questions were answered to her expressed satisfaction.  Plan: -Return to room on the floor -Regular diet -PRN pain control and antiemetics -Hold Pradaxa for 2 days postoperatively -Continue IV Zosyn while inpatient -Patient may develop postoperative ileus, as there was some dilation of the small bowel -Appreciate hospitalist recommendations  Theophilus Kinds, DO [facility] [address] [place], [place] [address] [phone] (office)

## 2023-10-09 NOTE — Transfer of Care (Signed)
Immediate Anesthesia Transfer of Care Note  Patient: Matthew Wise  Procedure(s) Performed: XI ROBOTIC LAPAROSCOPIC ASSISTED APPENDECTOMY  Patient Location: PACU  Anesthesia Type:General  Level of Consciousness: awake and alert   Airway & Oxygen Therapy: Patient Spontanous Breathing  Post-op Assessment: Report given to RN and Post -op Vital signs reviewed and stable  Post vital signs: Reviewed and stable  Last Vitals:  Vitals Value Taken Time  BP 129/44 10/09/23 1024  Temp 98.3   Pulse 83   Resp 20 10/09/23 1025  SpO2 95   Vitals shown include unfiled device data.  Last Pain:  Vitals:   10/09/23 0829  TempSrc:   PainSc: 0-No pain         Complications: No notable events documented.

## 2023-10-09 NOTE — Anesthesia Procedure Notes (Signed)
Procedure Name: Intubation Date/Time: 10/09/2023 9:05 AM  Performed by: Windell Norfolk, MDPre-anesthesia Checklist: Patient identified, Emergency Drugs available, Suction available and Patient being monitored Patient Re-evaluated:Patient Re-evaluated prior to induction Oxygen Delivery Method: Circle system utilized Preoxygenation: Pre-oxygenation with 100% oxygen Induction Type: IV induction Ventilation: Mask ventilation without difficulty Laryngoscope Size: Mac and 3 Grade View: Grade III Tube type: Oral Tube size: 7.5 mm Number of attempts: 1 Airway Equipment and Method: Stylet and Oral airway Placement Confirmation: ETT inserted through vocal cords under direct vision, positive ETCO2 and breath sounds checked- equal and bilateral Tube secured with: Tape Dental Injury: Teeth and Oropharynx as per pre-operative assessment

## 2023-10-09 NOTE — Anesthesia Postprocedure Evaluation (Signed)
Anesthesia Post Note  Patient: Matthew Wise  Procedure(s) Performed: XI ROBOTIC LAPAROSCOPIC ASSISTED APPENDECTOMY  Patient location during evaluation: Phase II Anesthesia Type: General Level of consciousness: awake Pain management: pain level controlled Vital Signs Assessment: post-procedure vital signs reviewed and stable Respiratory status: spontaneous breathing and respiratory function stable Cardiovascular status: blood pressure returned to baseline and stable Postop Assessment: no headache and no apparent nausea or vomiting Anesthetic complications: no Comments: Late entry   No notable events documented.   Last Vitals:  Vitals:   10/09/23 1030 10/09/23 1056  BP: (!) 103/57 109/67  Pulse:  76  Resp: (!) 24 19  Temp:  36.9 C  SpO2: 95% 94%    Last Pain:  Vitals:   10/09/23 1030  TempSrc:   PainSc: 0-No pain                 Windell Norfolk

## 2023-10-09 NOTE — Op Note (Signed)
[facility] Operative Note  Preoperative diagnosis: Acute appendicitis  Postoperative diagnosis: Same  Procedure: Robotic assisted laparoscopic appendectomy.  Anesthesia: General   Surgeon: Theophilus Kinds, DO  Wound Classification: Dirty/Infected  Specimen: Appendix  Complications: None  Estimated Blood Loss: 10 cc  Indications: Patient is a 62 y.o. male  presented with 1 day history of nausea, vomiting, back pain and weakness.  He was noted to have acute uncomplicated appendicitis on CT of the abdomen and pelvis. The risk of surgery were explained to the patient including but not limited to bleeding, infection, finding a rupture, injury to other organs, needing to do an open procedure.   FIndings: Acute appendicitis with associated phlegmon Upon entering the abdomen (organ space), I encountered a phlegmon involving the appendix .  Description of procedure: The patient was placed on the operating table in the supine position, left arm tucked. General anesthesia was induced. A time-out was completed verifying correct patient, procedure, site, positioning, and implant(s) and/or special equipment prior to beginning this procedure. The abdomen was prepped and draped in the usual sterile fashion.   At Palmer's point, an incision was made and Veress needle was inserted.  After confirming intraabdominal location with positive saline drop test and low insufflation pressures, gas insufflation was initiated until the abdominal pressure was measured at 15 mmHg.  Afterwards, the Veress needle was removed and a 8 mm port was placed through the Palmer's point site using Optiview technique. No injuries were noted.  Two additional incisions were made 8 cm apart along the left side of the abdominal wall from the initial incision.  8 mm ports were then placed under direct visualization.  The initial 8 mm port was then upsized to a 12 mm port.  No injuries from trocar placements  were noted. The table flexed and was placed in the Trendelenburg position with the right side elevated.  Xi robotic platform was then brought to the operative field and docked. A vessel sealer was placed through the 12 mm port and a forced bipolar through the lower 8 mm port.   Upon entering the abdomen (organ space), I encountered a phlegmon involving the appendix . An appendix that appeared acutely inflamed was identified and elevated.  Infection was present within the abdominal cavity due to appendicitis. Window created at base of appendix in the mesentery.  A white load linear cutting stapler was placed through the 12 mm port, and then used to divide and staple the base of the appendix. The vessel sealer was used to ligate the mesoappendix. No bleeding from the staple lines noted.  The appendix was placed in an endoscopic retrieval bag and removed through the 12 mm port.   The appendiceal stump staple line was examined again and hemostasis noted. No other pathology was identified within pelvis. Purulent exudate was irrigated out of the abdomen.    The 12 mm trocar removed and port site closed with PMI using 0 vicryl under direct vision. Remaining trocars were removed. No bleeding was noted.  The abdomen was allowed to collapse. Marcaine was instilled at the incision sites.  All skin incisions then closed with subcuticular sutures Monocryl 4-0.  Wounds then dressed with dermabond.  The patient tolerated the procedure well, awakened from anesthesia and was taken to the postanesthesia care unit in satisfactory condition.  Sponge count and instrument count correct at the end of the procedure.  Theophilus Kinds, DO [facility] [address] [place], [place] [address] [phone] (office)

## 2023-10-09 NOTE — Progress Notes (Signed)
PROGRESS NOTE   Matthew Wise  LKG:[id number] DOB: Jun 13, 1961 DOA: 10/08/2023 PCP: Anabel Halon, MD   Chief Complaint  Patient presents with   Weakness   Level of care: Telemetry  Brief Admission History:   62 y.o. male,with past medical history of CVA, with residual left-sided weakness, PE-DVT, on Pradaxa, CKD stage IIIb, hypothyroidism, hypertension, hyperlipidemia, patient presents to ED secondary to weakness, nausea, abdominal pain, subjective fever, he denies cough, congestion, chest pain, or vomiting, no dysuria. -In ED he was noted to be tender in right lower quadrant, CT abdomen pelvis has been obtained which was significant for acute appendicitis, his workup significant For creatinine 1.4, at baseline, low sodium at 3.2, and elevated white blood cell count at 15.4, patient was started on IV antibiotics, ED discussed with general surgery, they will hold on surgery today given he took his Pradaxa this morning, and plan to go to the OR tomorrow, [facility] hospitalist consulted to admit   Assessment and Plan:  Acute appendicitis -Presented with abdominal pain, nausea, fever, CT abdomen pelvis significant for acute appendicitis -Continue with IV Zosyn, follow-up blood cultures NGTD -pt to OR today    Hypertension> hypotension  -His blood pressure is on the lower side, responded well to IV fluids, as discussed with the patient reports his blood pressure has been on the lower side for few weeks now Holding home antihypertensive medications    Obesity last 2 Body mass index is 35.94 kg/m.   History of CVA with residual left-sided deficits -Continue with aspirin, to start postoperatively, continue with statin    History of DVT -temporarily holding Pradaxa in anticipation of surgery   Hypokalemia - being repleted    Hypothyroidism - resumed home levothyroxine   Mixed hyperlipidemia - Continue home statin    Chronic kidney disease, stage 3 B -At baseline, remains on IV  fluids  DVT prophylaxis:  SCD Code Status: Full  Family Communication:  Disposition: anticipate home    Consultants:  Surgery  Procedures:  Robotic lap appy 10/09/23 with Dr. Robyne Peers  Antimicrobials:  Zosyn 11/8>>   Subjective: Pt says he had fever overnight but no chills.  His abdominal pain is better controlled this morning.   Objective: Vitals:   10/08/23 2019 10/08/23 2353 10/09/23 0521 10/09/23 0829  BP: 103/60 126/70 128/65 112/65  Pulse: 63 (!) 54 68 (!) 58  Resp: 18 18 16    Temp:  99.6 F (37.6 C) 100.3 F (37.9 C)   TempSrc:      SpO2: 99% 92% 99% 97%  Weight:      Height:        Intake/Output Summary (Last 24 hours) at 10/09/2023 1001 Last data filed at 10/09/2023 0649 Gross per 24 hour  Intake 713.23 ml  Output 500 ml  Net 213.23 ml   Filed Weights   10/08/23 1254  Weight: 120.2 kg   Examination:  General exam: Appears calm and comfortable  Respiratory system: Clear to auscultation. Respiratory effort normal. Cardiovascular system: normal S1 & S2 heard. No JVD, murmurs, rubs, gallops or clicks. No pedal edema. Gastrointestinal system: Abdomen is nondistended, soft and nontender. No organomegaly or masses felt. Normal bowel sounds heard. Central nervous system: Alert and oriented. No focal neurological deficits. Extremities: Symmetric 5 x 5 power. Skin: No rashes, lesions or ulcers. Psychiatry: Judgement and insight appear normal. Mood & affect appropriate.   Data Reviewed: I have personally reviewed following labs and imaging studies  CBC: Recent Labs  Lab 10/08/23 1319 10/09/23  0352  WBC 15.4* 16.4*  NEUTROABS 12.7*  --   HGB 14.9 13.3  HCT 44.4 38.9*  MCV 81.8 81.6  PLT 225 200    Basic Metabolic Panel: Recent Labs  Lab 10/08/23 1319 10/09/23 0352  NA 138 137  K 3.2* 3.4*  CL 102 105  CO2 26 27  GLUCOSE 117* 102*  BUN 21* 17  CREATININE 1.45* 1.35*  CALCIUM 8.6* 8.4*    CBG: No results for input(s): "GLUCAP" in the  last 168 hours.  Recent Results (from the past 240 hour(s))  Resp panel by RT-PCR (RSV, Flu A&B, Covid) Anterior Nasal Swab     Status: None   Collection Time: 10/08/23  1:00 PM   Specimen: Anterior Nasal Swab  Result Value Ref Range Status   SARS Coronavirus 2 by RT PCR NEGATIVE NEGATIVE Final    Comment: (NOTE) SARS-CoV-2 target nucleic acids are NOT DETECTED.  The SARS-CoV-2 RNA is generally detectable in upper respiratory specimens during the acute phase of infection. The lowest concentration of SARS-CoV-2 viral copies this assay can detect is 138 copies/mL. A negative result does not preclude SARS-Cov-2 infection and should not be used as the sole basis for treatment or other patient management decisions. A negative result may occur with  improper specimen collection/handling, submission of specimen other than nasopharyngeal swab, presence of viral mutation(s) within the areas targeted by this assay, and inadequate number of viral copies(<138 copies/mL). A negative result must be combined with clinical observations, patient history, and epidemiological information. The expected result is Negative.  Fact Sheet for Patients:  BloggerCourse.com  Fact Sheet for Healthcare Providers:  SeriousBroker.it  This test is no t yet approved or cleared by the [place] FDA and  has been authorized for detection and/or diagnosis of SARS-CoV-2 by FDA under an Emergency Use Authorization (EUA). This EUA will remain  in effect (meaning this test can be used) for the duration of the COVID-19 declaration under Section 564(b)(1) of the Act, 21 U.S.C.section 360bbb-3(b)(1), unless the authorization is terminated  or revoked sooner.       Influenza A by PCR NEGATIVE NEGATIVE Final   Influenza B by PCR NEGATIVE NEGATIVE Final    Comment: (NOTE) The Xpert Xpress SARS-CoV-2/FLU/RSV plus assay is intended as an aid in the diagnosis of  influenza from Nasopharyngeal swab specimens and should not be used as a sole basis for treatment. Nasal washings and aspirates are unacceptable for Xpert Xpress SARS-CoV-2/FLU/RSV testing.  Fact Sheet for Patients: BloggerCourse.com  Fact Sheet for Healthcare Providers: SeriousBroker.it  This test is not yet approved or cleared by the [place] FDA and has been authorized for detection and/or diagnosis of SARS-CoV-2 by FDA under an Emergency Use Authorization (EUA). This EUA will remain in effect (meaning this test can be used) for the duration of the COVID-19 declaration under Section 564(b)(1) of the Act, 21 U.S.C. section 360bbb-3(b)(1), unless the authorization is terminated or revoked.     Resp Syncytial Virus by PCR NEGATIVE NEGATIVE Final    Comment: (NOTE) Fact Sheet for Patients: BloggerCourse.com  Fact Sheet for Healthcare Providers: SeriousBroker.it  This test is not yet approved or cleared by the [place] FDA and has been authorized for detection and/or diagnosis of SARS-CoV-2 by FDA under an Emergency Use Authorization (EUA). This EUA will remain in effect (meaning this test can be used) for the duration of the COVID-19 declaration under Section 564(b)(1) of the Act, 21 U.S.C. section 360bbb-3(b)(1), unless the authorization is terminated or  revoked.  Performed at [facility], [address], [place], [place] [address]   Blood Culture (routine x 2)     Status: None (Preliminary result)   Collection Time: 10/08/23  1:19 PM   Specimen: BLOOD  Result Value Ref Range Status   Specimen Description BLOOD rt forearm  Final   Special Requests   Final    BOTTLES DRAWN AEROBIC AND ANAEROBIC Blood Culture adequate volume   Culture   Final    NO GROWTH < 24 HOURS Performed at [facility], [address] [place], [place] [address]    Report Status PENDING   Incomplete  Blood Culture (routine x 2)     Status: None (Preliminary result)   Collection Time: 10/08/23  1:20 PM   Specimen: BLOOD  Result Value Ref Range Status   Specimen Description BLOOD BLOOD RIGHT HAND  Final   Special Requests   Final    BOTTLES DRAWN AEROBIC AND ANAEROBIC Blood Culture results may not be optimal due to an excessive volume of blood received in culture bottles   Culture   Final    NO GROWTH < 24 HOURS Performed at [facility], [address], [place], [place] [address]    Report Status PENDING  Incomplete     Radiology Studies: CT CHEST ABDOMEN PELVIS W CONTRAST  Result Date: 10/08/2023 CLINICAL DATA:  Sepsis EXAM: CT CHEST, ABDOMEN, AND PELVIS WITH CONTRAST TECHNIQUE: Multidetector CT imaging of the chest, abdomen and pelvis was performed following the standard protocol during bolus administration of intravenous contrast. RADIATION DOSE REDUCTION: This exam was performed according to the departmental dose-optimization program which includes automated exposure control, adjustment of the mA and/or kV according to patient size and/or use of iterative reconstruction technique. CONTRAST:  OMNIPAQUE IOHEXOL 300 MG/ML  SOLN COMPARISON:  CT angio chest 12/03/2020, CT abdomen pelvis 04/10/2016 FINDINGS: CT CHEST FINDINGS Cardiovascular: Normal heart size. No significant pericardial effusion. The thoracic aorta is normal in caliber. Mild atherosclerotic plaque of the thoracic aorta. No coronary artery calcifications. 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 The main pulmonary artery is normal in caliber. No central pulmonary embolus. Limited evaluation more distally due to timing of contrast. Mediastinum/Nodes: No enlarged mediastinal, hilar, or axillary lymph nodes. Thyroid gland, trachea, and esophagus demonstrate no significant findings. Lungs/Pleura: No focal consolidation. No pulmonary nodule.  No pulmonary mass. No pleural effusion. No pneumothorax. Musculoskeletal: Similar-appearing superficial subcutaneus soft tissue 1.7 cm fluid density lesion along the right axilla/chest wall likely representing a sebaceous cyst. No suspicious lytic or blastic osseous lesions. No acute displaced fracture. CT ABDOMEN PELVIS FINDINGS Hepatobiliary: No focal liver abnormality. No gallstones, gallbladder wall thickening, or pericholecystic fluid. No biliary dilatation. Pancreas: No focal lesion. Normal pancreatic contour. No surrounding inflammatory changes. No main pancreatic ductal dilatation. Spleen: Normal in size without focal abnormality. Adrenals/Urinary Tract: No adrenal nodule bilaterally. Bilateral kidneys enhance symmetrically. Fluid density lesions likely represent simple renal cysts. Simple renal cysts, in the absence of clinically indicated signs/symptoms, require no independent follow-up. No hydronephrosis. No hydroureter. The urinary bladder is unremarkable. Stomach/Bowel: Stomach is within normal limits. No evidence of bowel wall thickening or dilatation. The appendix is enlarged in caliber measuring up to 1.5 cm with associated appendiceal wall thickening and periappendiceal fat stranding. No definite appendicolith. 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 Multiple venous collaterals along the abdominal wall. No abdominal aorta or iliac  aneurysm. Moderate atherosclerotic plaque of the aorta and its branches. No abdominal, pelvic, or inguinal lymphadenopathy. Reproductive: Prostate is unremarkable. Other: No intraperitoneal free fluid. No intraperitoneal free gas. No organized fluid collection. Musculoskeletal: Tiny fat containing umbilical hernia. Left gluteal cleft superficial subcutaneus soft tissue 2.7 cm fluid density lesion likely representing a sebaceous cyst. No suspicious lytic or blastic osseous lesions. No acute displaced fracture. Degenerative  changes of the bilateral hips. IMPRESSION: 1. Acute non-perforated appendicitis (1.5 cm). No definite appendicoliths. 2. 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 3. No acute intrathoracic abnormality. Electronically Signed   By: Tish Frederickson M.D.   On: 10/08/2023 18:13   DG Chest Port 1 View  Result Date: 10/08/2023 CLINICAL DATA:  Fever.  Weakness. EXAM: PORTABLE CHEST 1 VIEW COMPARISON:  Chest radiograph dated Apr 24, 2022. FINDINGS: The heart size and mediastinal contours are within normal limits. Both lungs are clear. No pneumothorax or pleural effusion. No acute osseous abnormality. IMPRESSION: No acute cardiopulmonary findings. Electronically Signed   By: Hart Robinsons M.D.   On: 10/08/2023 16:24    Scheduled Meds:  [MAR Hold] aspirin EC  81 mg Oral Daily   [MAR Hold] atorvastatin  40 mg Oral Daily   [MAR Hold] levothyroxine  25 mcg Oral Q0600   [MAR Hold] pantoprazole  40 mg Oral Daily   [MAR Hold] tamsulosin  0.4 mg Oral QPC supper   Continuous Infusions:  lactated ringers 75 mL/hr at 10/09/23 0519   [MAR Hold] piperacillin-tazobactam (ZOSYN)  IV 3.375 g (10/09/23 0602)     LOS: 1 day   Time spent: 46 mins  Caylynn Minchew Laural Benes, MD How to contact the [facility] Attending or Consulting provider 7A - 7P or covering provider during after hours 7P -7A, for this patient?  Check the care team in [facility] and look for a) attending/consulting [facility] provider listed and b) the [facility] team listed Log into [email or web address] to find provider on call.  Locate the [facility] provider you are looking for under [facility] and page to a number that you can be directly reached. If you still have difficulty reaching the provider, please page the [facility] (Director on Call) for the Hospitalists listed on amion for assistance.  10/09/2023, 10:01 AM

## 2023-10-09 NOTE — Progress Notes (Signed)
   10/09/23 0916  TOC Brief Assessment  Social Determinants of Health Reivew SDOH reviewed no interventions necessary  Readmission risk has been reviewed Yes Chilton Si)  Transition of care needs no transition of care needs at this time   Patient scheduled for surgery- while no current TOC needs, they may arise. TOC to monitor pt needs.

## 2023-10-09 NOTE — Consult Note (Signed)
[facility] Consult  Reason for Consult: Acute appendicitis Referring Physician: Carmel Sacramento, PA-C  Chief Complaint   Weakness     HPI: Matthew Wise is a 62 y.o. male who presents with a 1 day history of nausea, vomiting, generalized weakness, and back pain.  Stated that secondary to nausea and vomiting, he presented to the emergency department.  He denied any significant abdominal pain.  In the past when he has had similar episodes, he had been diagnosed with UTIs.  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  He denies any history of abdominal surgeries.  In the ED, he was hemodynamically stable.  He was noted to have a leukocytosis of 15.4.  He underwent a CT of the abdomen and pelvis which demonstrated acute uncomplicated appendicitis without evidence of perforation or abscess.  This morning, the patient states that he is feeling okay.  He does have some lower abdominal pain.  He had a Tmax of 100.5 overnight.  He has no other complaints at this time.  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 two simple tubular adenomas removed. patient was advised to go to [facility] for colonoscopy but he did not keep appt.   IR CT HEAD LTD  02/02/2021   IR PERCUTANEOUS ART THROMBECTOMY/INFUSION INTRACRANIAL INC DIAG ANGIO  02/02/2021   RADIOLOGY WITH ANESTHESIA N/A 02/02/2021   Procedure: IR WITH ANESTHESIA;  Surgeon: Julieanne Cotton, MD;  Location: [facility] OR;  Service: Radiology;  Laterality: N/A;   TEE WITHOUT CARDIOVERSION N/A 09/10/2020   Procedure: TRANSESOPHAGEAL ECHOCARDIOGRAM (TEE) WITH PROPOFOL;  Surgeon: Jonelle Sidle, MD;  Location: [facility] ORS;  Service: Cardiovascular;  Laterality: N/A;    Family History  Problem Relation Age of Onset   COPD Father    Stroke Brother    Pulmonary embolism Sister    Throat cancer Brother    Arthritis Mother    Diabetes Mother    Stroke Mother    Hypertension Brother    Diabetes Brother    Arthritis Brother    Diabetes Sister    Colon cancer Neg Hx     Social History   Tobacco Use   Smoking status: Former    Current packs/day: 1.00    Average packs/day: 1 pack/day for 39.9 years (39.9 ttl pk-yrs)  Types: Cigarettes    Start date: 12/01/1983   Smokeless tobacco: Never  Vaping Use   Vaping status: Never Used  Substance Use Topics   Alcohol use: Not Currently    Alcohol/week: 3.0 standard drinks of alcohol    Types: 3 Cans of beer per week    Comment: has not drank sice 11/27/20   Drug use: Not Currently    Comment: REMOTE HISTORY, Quit in 2007    Medications: I have reviewed the patient's current medications.  No Known Allergies   ROS:  Pertinent items are noted in HPI.  Blood pressure  112/65, pulse (!) 58, temperature 100.3 F (37.9 C), resp. rate 16, height 6' (1.829 m), weight 120.2 kg, SpO2 97%. Physical Exam Vitals reviewed.  Constitutional:      Appearance: Normal appearance.  HENT:     Head: Normocephalic and atraumatic.  Eyes:     Extraocular Movements: Extraocular movements intact.     Pupils: Pupils are equal, round, and reactive to light.  Cardiovascular:     Rate and Rhythm: Normal rate.  Pulmonary:     Effort: Pulmonary effort is normal.  Abdominal:     Comments: Abdomen soft, nondistended, no percussion tenderness, mild right lower quadrant tenderness to palpation; no rigidity, guarding, rebound tenderness  Musculoskeletal:     Cervical back: Normal range of motion.     Comments: Decreased range of motion of left upper extremity secondary to CVA  Neurological:     Mental Status: He is alert and oriented to person, place, and time.  Psychiatric:        Mood and Affect: Mood normal.        Behavior: Behavior normal.     Results: Results for orders placed or performed during the hospital encounter of 10/08/23 (from the past 48 hour(s))  Resp panel by RT-PCR (RSV, Flu A&B, Covid) Anterior Nasal Swab     Status: None   Collection Time: 10/08/23  1:00 PM   Specimen: Anterior Nasal Swab  Result Value Ref Range   SARS Coronavirus 2 by RT PCR NEGATIVE NEGATIVE    Comment: (NOTE) SARS-CoV-2 target nucleic acids are NOT DETECTED.  The SARS-CoV-2 RNA is generally detectable in upper respiratory specimens during the acute phase of infection. The lowest concentration of SARS-CoV-2 viral copies this assay can detect is 138 copies/mL. A negative result does not preclude SARS-Cov-2 infection and should not be used as the sole basis for treatment or other patient management decisions. A negative result may occur with  improper specimen collection/handling, submission of specimen other than nasopharyngeal swab, presence of viral mutation(s) within the areas  targeted by this assay, and inadequate number of viral copies(<138 copies/mL). A negative result must be combined with clinical observations, patient history, and epidemiological information. The expected result is Negative.  Fact Sheet for Patients:  BloggerCourse.com  Fact Sheet for Healthcare Providers:  SeriousBroker.it  This test is no t yet approved or cleared by the [place] FDA and  has been authorized for detection and/or diagnosis of SARS-CoV-2 by FDA under an Emergency Use Authorization (EUA). This EUA will remain  in effect (meaning this test can be used) for the duration of the COVID-19 declaration under Section 564(b)(1) of the Act, 21 U.S.C.section 360bbb-3(b)(1), unless the authorization is terminated  or revoked sooner.       Influenza A by PCR NEGATIVE NEGATIVE   Influenza B by PCR NEGATIVE NEGATIVE    Comment: (NOTE) The Xpert Xpress SARS-CoV-2/FLU/RSV plus assay is  intended as an aid in the diagnosis of influenza from Nasopharyngeal swab specimens and should not be used as a sole basis for treatment. Nasal washings and aspirates are unacceptable for Xpert Xpress SARS-CoV-2/FLU/RSV testing.  Fact Sheet for Patients: BloggerCourse.com  Fact Sheet for Healthcare Providers: SeriousBroker.it  This test is not yet approved or cleared by the [place] FDA and has been authorized for detection and/or diagnosis of SARS-CoV-2 by FDA under an Emergency Use Authorization (EUA). This EUA will remain in effect (meaning this test can be used) for the duration of the COVID-19 declaration under Section 564(b)(1) of the Act, 21 U.S.C. section 360bbb-3(b)(1), unless the authorization is terminated or revoked.     Resp Syncytial Virus by PCR NEGATIVE NEGATIVE    Comment: (NOTE) Fact Sheet for Patients: BloggerCourse.com  Fact Sheet for  Healthcare Providers: SeriousBroker.it  This test is not yet approved or cleared by the [place] FDA and has been authorized for detection and/or diagnosis of SARS-CoV-2 by FDA under an Emergency Use Authorization (EUA). This EUA will remain in effect (meaning this test can be used) for the duration of the COVID-19 declaration under Section 564(b)(1) of the Act, 21 U.S.C. section 360bbb-3(b)(1), unless the authorization is terminated or revoked.  Performed at [facility], [address] [place], [place] [address]   Lactic acid, plasma     Status: None   Collection Time: 10/08/23  1:19 PM  Result Value Ref Range   Lactic Acid, Venous 1.9 0.5 - 1.9 mmol/L    Comment: Performed at [facility], [address] [place], [place] [address]  Comprehensive metabolic panel     Status: Abnormal   Collection Time: 10/08/23  1:19 PM  Result Value Ref Range   Sodium 138 135 - 145 mmol/L   Potassium 3.2 (L) 3.5 - 5.1 mmol/L   Chloride 102 98 - 111 mmol/L   CO2 26 22 - 32 mmol/L   Glucose, Bld 117 (H) 70 - 99 mg/dL    Comment: Glucose reference range applies only to samples taken after fasting for at least 8 hours.   BUN 21 (H) 6 - 20 mg/dL   Creatinine, Ser 0.98 (H) 0.61 - 1.24 mg/dL   Calcium 8.6 (L) 8.9 - 10.3 mg/dL   Total Protein 7.0 6.5 - 8.1 g/dL   Albumin 3.6 3.5 - 5.0 g/dL   AST 25 15 - 41 U/L   ALT 54 (H) 0 - 44 U/L   Alkaline Phosphatase 105 38 - 126 U/L   Total Bilirubin 1.4 (H) <1.2 mg/dL   GFR, Estimated 55 (L) >60 mL/min    Comment: (NOTE) Calculated using the CKD-EPI Creatinine Equation (2021)    Anion gap 10 5 - 15    Comment: Performed at [facility], [address], [place], [place] [address]  CBC with Differential     Status: Abnormal   Collection Time: 10/08/23  1:19 PM  Result Value Ref Range   WBC 15.4 (H) 4.0 - 10.5 K/uL   RBC 5.43 4.22 - 5.81 MIL/uL   Hemoglobin 14.9 13.0 - 17.0 g/dL   HCT 78.2 95.6 - 21.3 %   MCV 81.8 80.0 -  100.0 fL   MCH 27.4 26.0 - 34.0 pg   MCHC 33.6 30.0 - 36.0 g/dL   RDW 08.6 57.8 - 46.9 %   Platelets 225 150 - 400 K/uL   nRBC 0.1 0.0 - 0.2 %   Neutrophils Relative % 82 %   Neutro Abs 12.7 (H)  1.7 - 7.7 K/uL   Lymphocytes Relative 11 %   Lymphs Abs 1.7 0.7 - 4.0 K/uL   Monocytes Relative 6 %   Monocytes Absolute 0.9 0.1 - 1.0 K/uL   Eosinophils Relative 0 %   Eosinophils Absolute 0.0 0.0 - 0.5 K/uL   Basophils Relative 0 %   Basophils Absolute 0.0 0.0 - 0.1 K/uL   Immature Granulocytes 1 %   Abs Immature Granulocytes 0.07 0.00 - 0.07 K/uL    Comment: Performed at [facility], [address] [place], [place] [address]  Protime-INR     Status: Abnormal   Collection Time: 10/08/23  1:19 PM  Result Value Ref Range   Prothrombin Time 17.8 (H) 11.4 - 15.2 seconds   INR 1.4 (H) 0.8 - 1.2    Comment: (NOTE) INR goal varies based on device and disease states. Performed at [facility], [address] [place], [place] [address]   APTT     Status: None   Collection Time: 10/08/23  1:19 PM  Result Value Ref Range   aPTT 35 24 - 36 seconds    Comment: Performed at [facility], [address], [place], [place] [address]  Blood Culture (routine x 2)     Status: None (Preliminary result)   Collection Time: 10/08/23  1:19 PM   Specimen: BLOOD  Result Value Ref Range   Specimen Description BLOOD rt forearm    Special Requests      BOTTLES DRAWN AEROBIC AND ANAEROBIC Blood Culture adequate volume   Culture      NO GROWTH < 24 HOURS Performed at [facility], [address], [place], [place] [address]    Report Status PENDING   Blood Culture (routine x 2)     Status: None (Preliminary result)   Collection Time: 10/08/23  1:20 PM   Specimen: BLOOD  Result Value Ref Range   Specimen Description BLOOD BLOOD RIGHT HAND    Special Requests      BOTTLES DRAWN AEROBIC AND ANAEROBIC Blood Culture results may not be optimal due to an excessive volume of blood received in culture bottles    Culture      NO GROWTH < 24 HOURS Performed at [facility], [address], [place], [place] [address]    Report Status PENDING   Urinalysis, w/ Reflex to Culture (Infection Suspected) -Urine, Clean Catch     Status: None   Collection Time: 10/08/23  3:00 PM  Result Value Ref Range   Specimen Source URINE, CLEAN CATCH    Color, Urine YELLOW YELLOW   APPearance CLEAR CLEAR   Specific Gravity, Urine 1.011 1.005 - 1.030   pH 5.0 5.0 - 8.0   Glucose, UA NEGATIVE NEGATIVE mg/dL   Hgb urine dipstick NEGATIVE NEGATIVE   Bilirubin Urine NEGATIVE NEGATIVE   Ketones, ur NEGATIVE NEGATIVE mg/dL   Protein, ur NEGATIVE NEGATIVE mg/dL   Nitrite NEGATIVE NEGATIVE   Leukocytes,Ua NEGATIVE NEGATIVE   RBC / HPF 0-5 0 - 5 RBC/hpf   WBC, UA 0-5 0 - 5 WBC/hpf    Comment:        Reflex urine culture not performed if WBC <=10, OR if Squamous epithelial cells >5. If Squamous epithelial cells >5 suggest recollection.    Bacteria, UA NONE SEEN NONE SEEN   Squamous Epithelial / HPF 0-5 0 - 5 /HPF   Mucus PRESENT     Comment: Performed at [facility], [address], [place], [place] [address]  Lactic acid, plasma  Status: None   Collection Time: 10/08/23  4:17 PM  Result Value Ref Range   Lactic Acid, Venous 1.5 0.5 - 1.9 mmol/L    Comment: Performed at [facility], [address], [place], [place] [address]  Basic metabolic panel     Status: Abnormal   Collection Time: 10/09/23  3:52 AM  Result Value Ref Range   Sodium 137 135 - 145 mmol/L   Potassium 3.4 (L) 3.5 - 5.1 mmol/L   Chloride 105 98 - 111 mmol/L   CO2 27 22 - 32 mmol/L   Glucose, Bld 102 (H) 70 - 99 mg/dL    Comment: Glucose reference range applies only to samples taken after fasting for at least 8 hours.   BUN 17 6 - 20 mg/dL   Creatinine, Ser 1.02 (H) 0.61 - 1.24 mg/dL   Calcium 8.4 (L) 8.9 - 10.3 mg/dL   GFR, Estimated >72 >53 mL/min    Comment: (NOTE) Calculated using the CKD-EPI Creatinine Equation (2021)    Anion gap 5  5 - 15    Comment: Performed at [facility], [address] [place], [place] [address]  CBC     Status: Abnormal   Collection Time: 10/09/23  3:52 AM  Result Value Ref Range   WBC 16.4 (H) 4.0 - 10.5 K/uL   RBC 4.77 4.22 - 5.81 MIL/uL   Hemoglobin 13.3 13.0 - 17.0 g/dL   HCT 34.7 (L) 42.5 - 95.6 %   MCV 81.6 80.0 - 100.0 fL   MCH 27.9 26.0 - 34.0 pg   MCHC 34.2 30.0 - 36.0 g/dL   RDW 38.7 56.4 - 33.2 %   Platelets 200 150 - 400 K/uL   nRBC 0.0 0.0 - 0.2 %    Comment: Performed at [facility], [address], [place], [place] [address]   *Note: Due to a large number of results and/or encounters for the requested time period, some results have not been displayed. A complete set of results can be found in Results Review.    CT CHEST ABDOMEN PELVIS W CONTRAST  Result Date: 10/08/2023 CLINICAL DATA:  Sepsis EXAM: CT CHEST, ABDOMEN, AND PELVIS WITH CONTRAST TECHNIQUE: Multidetector CT imaging of the chest, abdomen and pelvis was performed following the standard protocol during bolus administration of intravenous contrast. RADIATION DOSE REDUCTION: This exam was performed according to the departmental dose-optimization program which includes automated exposure control, adjustment of the mA and/or kV according to patient size and/or use of iterative reconstruction technique. CONTRAST:  OMNIPAQUE IOHEXOL 300 MG/ML  SOLN COMPARISON:  CT angio chest 12/03/2020, CT abdomen pelvis 04/10/2016 FINDINGS: CT CHEST FINDINGS Cardiovascular: Normal heart size. No significant pericardial effusion. The thoracic aorta is normal in caliber. Mild atherosclerotic plaque of the thoracic aorta. No coronary artery calcifications. 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 The main pulmonary artery is normal in caliber. No central pulmonary embolus. Limited evaluation more distally due to timing of contrast. Mediastinum/Nodes: No enlarged  mediastinal, hilar, or axillary lymph nodes. Thyroid gland, trachea, and esophagus demonstrate no significant findings. Lungs/Pleura: No focal consolidation. No pulmonary nodule. No pulmonary mass. No pleural effusion. No pneumothorax. Musculoskeletal: Similar-appearing superficial subcutaneus soft tissue 1.7 cm fluid density lesion along the right axilla/chest wall likely representing a sebaceous cyst. No suspicious lytic or blastic osseous lesions. No acute displaced fracture. CT ABDOMEN PELVIS FINDINGS Hepatobiliary: No focal liver abnormality. No gallstones, gallbladder wall thickening, or pericholecystic  fluid. No biliary dilatation. Pancreas: No focal lesion. Normal pancreatic contour. No surrounding inflammatory changes. No main pancreatic ductal dilatation. Spleen: Normal in size without focal abnormality. Adrenals/Urinary Tract: No adrenal nodule bilaterally. Bilateral kidneys enhance symmetrically. Fluid density lesions likely represent simple renal cysts. Simple renal cysts, in the absence of clinically indicated signs/symptoms, require no independent follow-up. No hydronephrosis. No hydroureter. The urinary bladder is unremarkable. Stomach/Bowel: Stomach is within normal limits. No evidence of bowel wall thickening or dilatation. The appendix is enlarged in caliber measuring up to 1.5 cm with associated appendiceal wall thickening and periappendiceal fat stranding. No definite appendicolith. 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 Multiple venous collaterals along the abdominal wall. No abdominal aorta or iliac aneurysm. Moderate atherosclerotic plaque of the aorta and its branches. No abdominal, pelvic, or inguinal lymphadenopathy. Reproductive: Prostate is unremarkable. Other: No intraperitoneal free fluid. No intraperitoneal free gas. No organized fluid collection. Musculoskeletal: Tiny fat containing umbilical hernia. Left gluteal cleft superficial  subcutaneus soft tissue 2.7 cm fluid density lesion likely representing a sebaceous cyst. No suspicious lytic or blastic osseous lesions. No acute displaced fracture. Degenerative changes of the bilateral hips. IMPRESSION: 1. Acute non-perforated appendicitis (1.5 cm). No definite appendicoliths. 2. 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 3. No acute intrathoracic abnormality. Electronically Signed   By: Tish Frederickson M.D.   On: 10/08/2023 18:13   DG Chest Port 1 View  Result Date: 10/08/2023 CLINICAL DATA:  Fever.  Weakness. EXAM: PORTABLE CHEST 1 VIEW COMPARISON:  Chest radiograph dated Apr 24, 2022. FINDINGS: The heart size and mediastinal contours are within normal limits. Both lungs are clear. No pneumothorax or pleural effusion. No acute osseous abnormality. IMPRESSION: No acute cardiopulmonary findings. Electronically Signed   By: Hart Robinsons M.D.   On: 10/08/2023 16:24     Assessment & Plan:  WOOD AXELSON is a 62 y.o. male who was admitted with acute appendicitis.  Imaging and blood work evaluated by myself.  -Discussed the risk of robotic assisted laparoscopic appendectomy and the option of antibiotics alone. Discussed that in [place] and some trials in the [place], antibiotics are used for simple appendicitis. Discussed that research shows a 40% failure rate for antibiotics alone.  Discussed risk of surgery including but not limited to bleeding, infection, injury to other organs, normal appendix, and after this discussion the patient has decided to proceed with proceed with surgery. -We also discussed that he is at slightly higher risk for bleeding given his last dose of Pradaxa was yesterday morning.  I feel that we should proceed with surgery today given his increasing leukocytosis and he was febrile overnight. -Plan for robotic assisted laparoscopic appendectomy today -NPO -IV fluids per  primary team -Continue IV Zosyn -PRN pain control and antiemetics -Further recommendations to follow surgery  All questions were answered to the satisfaction of the patient.  -- Theophilus Kinds, DO [facility] [address] [place], [place] [address] [phone] (office)

## 2023-10-09 NOTE — Hospital Course (Signed)
62 y.o. male,with past medical history of CVA, with residual left-sided weakness, PE-DVT, on Pradaxa, CKD stage IIIb, hypothyroidism, hypertension, hyperlipidemia, patient presents to ED secondary to weakness, nausea, abdominal pain, subjective fever, he denies cough, congestion, chest pain, or vomiting, no dysuria. -In ED he was noted to be tender in right lower quadrant, CT abdomen pelvis has been obtained which was significant for acute appendicitis, his workup significant For creatinine 1.4, at baseline, low sodium at 3.2, and elevated white blood cell count at 15.4, patient was started on IV antibiotics, ED discussed with general surgery, they will hold on surgery today given he took his Pradaxa this morning, and plan to go to the OR tomorrow, [facility] hospitalist consulted to admit

## 2023-10-10 ENCOUNTER — Inpatient Hospital Stay (HOSPITAL_COMMUNITY): Payer: 59

## 2023-10-10 DIAGNOSIS — I10 Essential (primary) hypertension: Secondary | ICD-10-CM | POA: Diagnosis not present

## 2023-10-10 DIAGNOSIS — K3580 Unspecified acute appendicitis: Secondary | ICD-10-CM | POA: Diagnosis not present

## 2023-10-10 DIAGNOSIS — I6930 Unspecified sequelae of cerebral infarction: Secondary | ICD-10-CM | POA: Diagnosis not present

## 2023-10-10 LAB — CBC WITH DIFFERENTIAL/PLATELET
Abs Immature Granulocytes: 0.05 10*3/uL (ref 0.00–0.07)
Basophils Absolute: 0 10*3/uL (ref 0.0–0.1)
Basophils Relative: 0 %
Eosinophils Absolute: 0.1 10*3/uL (ref 0.0–0.5)
Eosinophils Relative: 1 %
HCT: 40.9 % (ref 39.0–52.0)
Hemoglobin: 13.9 g/dL (ref 13.0–17.0)
Immature Granulocytes: 0 %
Lymphocytes Relative: 7 %
Lymphs Abs: 0.9 10*3/uL (ref 0.7–4.0)
MCH: 28 pg (ref 26.0–34.0)
MCHC: 34 g/dL (ref 30.0–36.0)
MCV: 82.3 fL (ref 80.0–100.0)
Monocytes Absolute: 1 10*3/uL (ref 0.1–1.0)
Monocytes Relative: 7 %
Neutro Abs: 11.3 10*3/uL — ABNORMAL HIGH (ref 1.7–7.7)
Neutrophils Relative %: 85 %
Platelets: 189 10*3/uL (ref 150–400)
RBC: 4.97 MIL/uL (ref 4.22–5.81)
RDW: 13.9 % (ref 11.5–15.5)
WBC: 13.3 10*3/uL — ABNORMAL HIGH (ref 4.0–10.5)
nRBC: 0 % (ref 0.0–0.2)

## 2023-10-10 LAB — BASIC METABOLIC PANEL
Anion gap: 8 (ref 5–15)
BUN: 17 mg/dL (ref 6–20)
CO2: 24 mmol/L (ref 22–32)
Calcium: 8.7 mg/dL — ABNORMAL LOW (ref 8.9–10.3)
Chloride: 104 mmol/L (ref 98–111)
Creatinine, Ser: 1.6 mg/dL — ABNORMAL HIGH (ref 0.61–1.24)
GFR, Estimated: 49 mL/min — ABNORMAL LOW (ref >60)
Glucose, Bld: 135 mg/dL — ABNORMAL HIGH (ref 70–99)
Potassium: 3.3 mmol/L — ABNORMAL LOW (ref 3.5–5.1)
Sodium: 136 mmol/L (ref 135–145)

## 2023-10-10 MED ORDER — POTASSIUM CHLORIDE CRYS ER 20 MEQ PO TBCR
40.0000 meq | EXTENDED_RELEASE_TABLET | Freq: Once | ORAL | Status: AC
  Administered 2023-10-10: 40 meq via ORAL
  Filled 2023-10-10: qty 2

## 2023-10-10 MED ORDER — BISACODYL 10 MG RE SUPP
10.0000 mg | Freq: Every day | RECTAL | Status: DC
  Administered 2023-10-10 – 2023-10-13 (×4): 10 mg via RECTAL
  Filled 2023-10-10 (×5): qty 1

## 2023-10-10 MED ORDER — LACTATED RINGERS IV SOLN: INTRAVENOUS | Status: DC

## 2023-10-10 MED ORDER — PHENOL 1.4 % MT LIQD
1.0000 | OROMUCOSAL | Status: DC | PRN
  Filled 2023-10-10: qty 177

## 2023-10-10 NOTE — Progress Notes (Signed)
Pt had good night.  Did not request anything during the night.

## 2023-10-10 NOTE — Progress Notes (Signed)
PROGRESS NOTE   Matthew Wise  ZOX:[id number] DOB: [birth year] DOA: 10/08/2023 PCP: Anabel Halon, MD   Chief Complaint  Patient presents with   Weakness   Level of care: Telemetry  Brief Admission History:   61 y.o. male,with past medical history of CVA, with residual left-sided weakness, PE-DVT, on Pradaxa, CKD stage IIIb, hypothyroidism, hypertension, hyperlipidemia, patient presents to ED secondary to weakness, nausea, abdominal pain, subjective fever, he denies cough, congestion, chest pain, or vomiting, no dysuria. -In ED he was noted to be tender in right lower quadrant, CT abdomen pelvis has been obtained which was significant for acute appendicitis, his workup significant For creatinine 1.4, at baseline, low sodium at 3.2, and elevated white blood cell count at 15.4, patient was started on IV antibiotics, ED discussed with general surgery, they will hold on surgery today given he took his Pradaxa this morning, and plan to go to the OR tomorrow, [facility] hospitalist consulted to admit   Assessment and Plan:  Acute appendicitis - Treated -Presented with abdominal pain, nausea, fever, CT abdomen pelvis significant for acute appendicitis -Continue with IV Zosyn, follow-up blood cultures NGTD -postop s/p robotic lap appendectomy 10/09/23   Ileus -Pt having some abd distension, nausea and vomiting -discussed with Dr. Robyne Peers, make NPO, place NG tube  -supportive measures    Hypertension> hypotension (resolved) -His blood pressure is on the lower side, responded well to IV fluids, as discussed with the patient reports his blood pressure has been on the lower side for few weeks now Holding home antihypertensive medications    Obesity last 2 Body mass index is 35.94 kg/m.   History of CVA with residual left-sided deficits -Continue with aspirin, to start postoperatively, continue with statin    History of DVT -temporarily holding Pradaxa in the perioperative period per  surgery   Hypokalemia - being repleted  - check Mg and replete as needed    Hypothyroidism - resumed home levothyroxine   Mixed hyperlipidemia - Continue home statin    Chronic kidney disease, stage 3 B -At baseline, remains on IV fluids  DVT prophylaxis:  SCD Code Status: Full  Family Communication:  Disposition: anticipate home    Consultants:  Surgery  Procedures:  Robotic lap appy 10/09/23 with Dr. Robyne Peers  Antimicrobials:  Zosyn 11/8>>   Subjective: Pt reports he could not eat breakfast this morning because he has been nauseated and vomiting and his stomach is bloated.  He tried to have a bowel movement last night but he could not.  Objective: Vitals:   10/09/23 2027 10/09/23 2036 10/09/23 2237 10/10/23 0506  BP: (!) 85/60 (!) 94/53 111/72 119/74  Pulse: 90 83 85 93  Resp: 18   18  Temp: 100.1 F (37.8 C)   99.7 F (37.6 C)  TempSrc:    Oral  SpO2: 97% 100%  96%  Weight:      Height:        Intake/Output Summary (Last 24 hours) at 10/10/2023 1025 Last data filed at 10/10/2023 1009 Gross per 24 hour  Intake 572.34 ml  Output 1550 ml  Net -977.66 ml   Filed Weights   10/08/23 1254  Weight: 120.2 kg   Examination:  General exam: Appears calm and comfortable  Respiratory system: Clear to auscultation. Respiratory effort normal. Cardiovascular system: normal S1 & S2 heard. No JVD, murmurs, rubs, gallops or clicks. No pedal edema. Gastrointestinal system: Abdomen is markedly distended, mildly tender. No organomegaly or masses felt. hypoactive bowel sounds. Central nervous  system: Alert and oriented. No focal neurological deficits. Extremities: Symmetric 5 x 5 power. Skin: No rashes, lesions or ulcers. Psychiatry: Judgement and insight appear normal. Mood & affect appropriate.   Data Reviewed: I have personally reviewed following labs and imaging studies  CBC: Recent Labs  Lab 10/08/23 1319 10/09/23 0352 10/10/23 0424  WBC 15.4* 16.4* 13.3*   NEUTROABS 12.7*  --  11.3*  HGB 14.9 13.3 13.9  HCT 44.4 38.9* 40.9  MCV 81.8 81.6 82.3  PLT 225 200 189    Basic Metabolic Panel: Recent Labs  Lab 10/08/23 1319 10/09/23 0352 10/10/23 0424  NA 138 137 136  K 3.2* 3.4* 3.3*  CL 102 105 104  CO2 26 27 24   GLUCOSE 117* 102* 135*  BUN 21* 17 17  CREATININE 1.45* 1.35* 1.60*  CALCIUM 8.6* 8.4* 8.7*    CBG: No results for input(s): "GLUCAP" in the last 168 hours.  Recent Results (from the past 240 hour(s))  Resp panel by RT-PCR (RSV, Flu A&B, Covid) Anterior Nasal Swab     Status: None   Collection Time: 10/08/23  1:00 PM   Specimen: Anterior Nasal Swab  Result Value Ref Range Status   SARS Coronavirus 2 by RT PCR NEGATIVE NEGATIVE Final    Comment: (NOTE) SARS-CoV-2 target nucleic acids are NOT DETECTED.  The SARS-CoV-2 RNA is generally detectable in upper respiratory specimens during the acute phase of infection. The lowest concentration of SARS-CoV-2 viral copies this assay can detect is 138 copies/mL. A negative result does not preclude SARS-Cov-2 infection and should not be used as the sole basis for treatment or other patient management decisions. A negative result may occur with  improper specimen collection/handling, submission of specimen other than nasopharyngeal swab, presence of viral mutation(s) within the areas targeted by this assay, and inadequate number of viral copies(<138 copies/mL). A negative result must be combined with clinical observations, patient history, and epidemiological information. The expected result is Negative.  Fact Sheet for Patients:  BloggerCourse.com  Fact Sheet for Healthcare Providers:  SeriousBroker.it  This test is no t yet approved or cleared by the [place] FDA and  has been authorized for detection and/or diagnosis of SARS-CoV-2 by FDA under an Emergency Use Authorization (EUA). This EUA will remain  in effect  (meaning this test can be used) for the duration of the COVID-19 declaration under Section 564(b)(1) of the Act, 21 U.S.C.section 360bbb-3(b)(1), unless the authorization is terminated  or revoked sooner.       Influenza A by PCR NEGATIVE NEGATIVE Final   Influenza B by PCR NEGATIVE NEGATIVE Final    Comment: (NOTE) The Xpert Xpress SARS-CoV-2/FLU/RSV plus assay is intended as an aid in the diagnosis of influenza from Nasopharyngeal swab specimens and should not be used as a sole basis for treatment. Nasal washings and aspirates are unacceptable for Xpert Xpress SARS-CoV-2/FLU/RSV testing.  Fact Sheet for Patients: BloggerCourse.com  Fact Sheet for Healthcare Providers: SeriousBroker.it  This test is not yet approved or cleared by the [place] FDA and has been authorized for detection and/or diagnosis of SARS-CoV-2 by FDA under an Emergency Use Authorization (EUA). This EUA will remain in effect (meaning this test can be used) for the duration of the COVID-19 declaration under Section 564(b)(1) of the Act, 21 U.S.C. section 360bbb-3(b)(1), unless the authorization is terminated or revoked.     Resp Syncytial Virus by PCR NEGATIVE NEGATIVE Final    Comment: (NOTE) Fact Sheet for Patients: BloggerCourse.com  Fact Sheet for  Healthcare Providers: SeriousBroker.it  This test is not yet approved or cleared by the [place] and has been authorized for detection and/or diagnosis of SARS-CoV-2 by FDA under an Emergency Use Authorization (EUA). This EUA will remain in effect (meaning this test can be used) for the duration of the COVID-19 declaration under Section 564(b)(1) of the Act, 21 U.S.C. section 360bbb-3(b)(1), unless the authorization is terminated or revoked.  Performed at [facility], [address], [place], [place] [address]   Blood Culture (routine x 2)      Status: None (Preliminary result)   Collection Time: 10/08/23  1:19 PM   Specimen: BLOOD  Result Value Ref Range Status   Specimen Description BLOOD rt forearm  Final   Special Requests   Final    BOTTLES DRAWN AEROBIC AND ANAEROBIC Blood Culture adequate volume   Culture   Final    NO GROWTH 2 DAYS Performed at [facility], [address] [place], [place] [address]    Report Status PENDING  Incomplete  Blood Culture (routine x 2)     Status: None (Preliminary result)   Collection Time: 10/08/23  1:20 PM   Specimen: BLOOD  Result Value Ref Range Status   Specimen Description BLOOD BLOOD RIGHT HAND  Final   Special Requests   Final    BOTTLES DRAWN AEROBIC AND ANAEROBIC Blood Culture results may not be optimal due to an excessive volume of blood received in culture bottles   Culture   Final    NO GROWTH 2 DAYS Performed at [facility], [address] [place], [place] [address]    Report Status PENDING  Incomplete  Surgical pcr screen     Status: None   Collection Time: 10/09/23  7:30 AM   Specimen: Nasal Mucosa; Nasal Swab  Result Value Ref Range Status   MRSA, PCR NEGATIVE NEGATIVE Final   Staphylococcus aureus NEGATIVE NEGATIVE Final    Comment: (NOTE) The Xpert SA Assay (FDA approved for NASAL specimens in patients 68 years of age and older), is one component of a comprehensive surveillance program. It is not intended to diagnose infection nor to guide or monitor treatment. Performed at [facility], [address], [place], [place] [address]      Radiology Studies: CT CHEST ABDOMEN PELVIS W CONTRAST  Result Date: 10/08/2023 CLINICAL DATA:  Sepsis EXAM: CT CHEST, ABDOMEN, AND PELVIS WITH CONTRAST TECHNIQUE: Multidetector CT imaging of the chest, abdomen and pelvis was performed following the standard protocol during bolus administration of intravenous contrast. RADIATION DOSE REDUCTION: This exam was performed according to the departmental dose-optimization program  which includes automated exposure control, adjustment of the mA and/or kV according to patient size and/or use of iterative reconstruction technique. CONTRAST:  OMNIPAQUE IOHEXOL 300 MG/ML  SOLN COMPARISON:  CT angio chest 12/03/2020, CT abdomen pelvis 04/10/2016 FINDINGS: CT CHEST FINDINGS Cardiovascular: Normal heart size. No significant pericardial effusion. The thoracic aorta is normal in caliber. Mild atherosclerotic plaque of the thoracic aorta. No coronary artery calcifications. 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 The main pulmonary artery is normal in caliber. No central pulmonary embolus. Limited evaluation more distally due to timing of contrast. Mediastinum/Nodes: No enlarged mediastinal, hilar, or axillary lymph nodes. Thyroid gland, trachea, and esophagus demonstrate no significant findings. Lungs/Pleura: No focal consolidation. No pulmonary nodule. No pulmonary mass. No pleural effusion. No pneumothorax. Musculoskeletal: Similar-appearing superficial subcutaneus soft tissue 1.7 cm fluid  density lesion along the right axilla/chest wall likely representing a sebaceous cyst. No suspicious lytic or blastic osseous lesions. No acute displaced fracture. CT ABDOMEN PELVIS FINDINGS Hepatobiliary: No focal liver abnormality. No gallstones, gallbladder wall thickening, or pericholecystic fluid. No biliary dilatation. Pancreas: No focal lesion. Normal pancreatic contour. No surrounding inflammatory changes. No main pancreatic ductal dilatation. Spleen: Normal in size without focal abnormality. Adrenals/Urinary Tract: No adrenal nodule bilaterally. Bilateral kidneys enhance symmetrically. Fluid density lesions likely represent simple renal cysts. Simple renal cysts, in the absence of clinically indicated signs/symptoms, require no independent follow-up. No hydronephrosis. No hydroureter. The urinary bladder is unremarkable.  Stomach/Bowel: Stomach is within normal limits. No evidence of bowel wall thickening or dilatation. The appendix is enlarged in caliber measuring up to 1.5 cm with associated appendiceal wall thickening and periappendiceal fat stranding. No definite appendicolith. 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 Multiple venous collaterals along the abdominal wall. No abdominal aorta or iliac aneurysm. Moderate atherosclerotic plaque of the aorta and its branches. No abdominal, pelvic, or inguinal lymphadenopathy. Reproductive: Prostate is unremarkable. Other: No intraperitoneal free fluid. No intraperitoneal free gas. No organized fluid collection. Musculoskeletal: Tiny fat containing umbilical hernia. Left gluteal cleft superficial subcutaneus soft tissue 2.7 cm fluid density lesion likely representing a sebaceous cyst. No suspicious lytic or blastic osseous lesions. No acute displaced fracture. Degenerative changes of the bilateral hips. IMPRESSION: 1. Acute non-perforated appendicitis (1.5 cm). No definite appendicoliths. 2. 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 3. No acute intrathoracic abnormality. Electronically Signed   By: Tish Frederickson M.D.   On: 10/08/2023 18:13   DG Chest Port 1 View  Result Date: 10/08/2023 CLINICAL DATA:  Fever.  Weakness. EXAM: PORTABLE CHEST 1 VIEW COMPARISON:  Chest radiograph dated Apr 24, 2022. FINDINGS: The heart size and mediastinal contours are within normal limits. Both lungs are clear. No pneumothorax or pleural effusion. No acute osseous abnormality. IMPRESSION: No acute cardiopulmonary findings. Electronically Signed   By: Hart Robinsons M.D.   On: 10/08/2023 16:24    Scheduled Meds:  acetaminophen  1,000 mg Oral Q6H   [START ON 10/11/2023] aspirin EC  81 mg Oral Daily   atorvastatin  40 mg Oral Daily   levothyroxine  25  mcg Oral Q0600   pantoprazole  40 mg Oral Daily   tamsulosin  0.4 mg Oral QPC supper   Continuous Infusions:  piperacillin-tazobactam (ZOSYN)  IV 3.375 g (10/10/23 0510)    LOS: 2 days   Time spent: 50 mins  Orrin Yurkovich Laural Benes, MD How to contact the [facility] Attending or Consulting provider 7A - 7P or covering provider during after hours 7P -7A, for this patient?  Check the care team in [facility] and look for a) attending/consulting [facility] provider listed and b) the [facility] team listed Log into [email or web address] to find provider on call.  Locate the [facility] provider you are looking for under [facility] and page to a number that you can be directly reached. If you still have difficulty reaching the provider, please page the [facility] (Director on Call) for the Hospitalists listed on amion for assistance.  10/10/2023, 10:25 AM

## 2023-10-10 NOTE — Progress Notes (Signed)
[facility] Progress Note  1 Day Post-Op  Subjective: Patient seen and examined.  He is resting comfortably in bed.  He just underwent NG tube insertion, as he had an episode of emesis this morning with increased abdominal distention.  He denies significant abdominal pain.  He denies any flatus since surgery, but states he might of had a very small bowel movement.  Objective: Vital signs in last 24 hours: Temp:  [98.1 F (36.7 C)-100.1 F (37.8 C)] 99.7 F (37.6 C) (11/10 0506) Pulse Rate:  [50-93] 93 (11/10 0506) Resp:  [18-19] 18 (11/10 0506) BP: (85-129)/(53-74) 119/74 (11/10 0506) SpO2:  [94 %-100 %] 96 % (11/10 0506) Last BM Date : 10/07/23  Intake/Output from previous day: 11/09 0701 - 11/10 0700 In: 1272.3 [P.O.:240; I.V.:932.3; IV Piggyback:100] Out: 950 [Urine:950] Intake/Output this shift: Total I/O In: -  Out: 600 [Urine:300; Emesis/NG output:300]  General appearance: alert, cooperative, and no distress GI: Abdomen soft, moderate distention, no percussion tenderness, nontender to palpation; no rigidity, guarding, rebound tenderness; incisions C/D/I with Dermabond in place  Lab Results:  Recent Labs    10/09/23 0352 10/10/23 0424  WBC 16.4* 13.3*  HGB 13.3 13.9  HCT 38.9* 40.9  PLT 200 189   BMET Recent Labs    10/09/23 0352 10/10/23 0424  NA 137 136  K 3.4* 3.3*  CL 105 104  CO2 27 24  GLUCOSE 102* 135*  BUN 17 17  CREATININE 1.35* 1.60*  CALCIUM 8.4* 8.7*   PT/INR Recent Labs    10/08/23 1319  LABPROT 17.8*  INR 1.4*    Studies/Results: CT CHEST ABDOMEN PELVIS W CONTRAST  Result Date: 10/08/2023 CLINICAL DATA:  Sepsis EXAM: CT CHEST, ABDOMEN, AND PELVIS WITH CONTRAST TECHNIQUE: Multidetector CT imaging of the chest, abdomen and pelvis was performed following the standard protocol during bolus administration of intravenous contrast. RADIATION DOSE REDUCTION: This exam was performed according to the departmental  dose-optimization program which includes automated exposure control, adjustment of the mA and/or kV according to patient size and/or use of iterative reconstruction technique. CONTRAST:  OMNIPAQUE IOHEXOL 300 MG/ML  SOLN COMPARISON:  CT angio chest 12/03/2020, CT abdomen pelvis 04/10/2016 FINDINGS: CT CHEST FINDINGS Cardiovascular: Normal heart size. No significant pericardial effusion. The thoracic aorta is normal in caliber. Mild atherosclerotic plaque of the thoracic aorta. No coronary artery calcifications. 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 The main pulmonary artery is normal in caliber. No central pulmonary embolus. Limited evaluation more distally due to timing of contrast. Mediastinum/Nodes: No enlarged mediastinal, hilar, or axillary lymph nodes. Thyroid gland, trachea, and esophagus demonstrate no significant findings. Lungs/Pleura: No focal consolidation. No pulmonary nodule. No pulmonary mass. No pleural effusion. No pneumothorax. Musculoskeletal: Similar-appearing superficial subcutaneus soft tissue 1.7 cm fluid density lesion along the right axilla/chest wall likely representing a sebaceous cyst. No suspicious lytic or blastic osseous lesions. No acute displaced fracture. CT ABDOMEN PELVIS FINDINGS Hepatobiliary: No focal liver abnormality. No gallstones, gallbladder wall thickening, or pericholecystic fluid. No biliary dilatation. Pancreas: No focal lesion. Normal pancreatic contour. No surrounding inflammatory changes. No main pancreatic ductal dilatation. Spleen: Normal in size without focal abnormality. Adrenals/Urinary Tract: No adrenal nodule bilaterally. Bilateral kidneys enhance symmetrically. Fluid density lesions likely represent simple renal cysts. Simple renal cysts, in the absence of clinically indicated signs/symptoms, require no independent follow-up. No hydronephrosis. No hydroureter. The urinary  bladder is unremarkable. Stomach/Bowel: Stomach is within normal limits.  No evidence of bowel wall thickening or dilatation. The appendix is enlarged in caliber measuring up to 1.5 cm with associated appendiceal wall thickening and periappendiceal fat stranding. No definite appendicolith. 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 Multiple venous collaterals along the abdominal wall. No abdominal aorta or iliac aneurysm. Moderate atherosclerotic plaque of the aorta and its branches. No abdominal, pelvic, or inguinal lymphadenopathy. Reproductive: Prostate is unremarkable. Other: No intraperitoneal free fluid. No intraperitoneal free gas. No organized fluid collection. Musculoskeletal: Tiny fat containing umbilical hernia. Left gluteal cleft superficial subcutaneus soft tissue 2.7 cm fluid density lesion likely representing a sebaceous cyst. No suspicious lytic or blastic osseous lesions. No acute displaced fracture. Degenerative changes of the bilateral hips. IMPRESSION: 1. Acute non-perforated appendicitis (1.5 cm). No definite appendicoliths. 2. 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 3. No acute intrathoracic abnormality. Electronically Signed   By: Tish Frederickson M.D.   On: 10/08/2023 18:13   DG Chest Port 1 View  Result Date: 10/08/2023 CLINICAL DATA:  Fever.  Weakness. EXAM: PORTABLE CHEST 1 VIEW COMPARISON:  Chest radiograph dated Apr 24, 2022. FINDINGS: The heart size and mediastinal contours are within normal limits. Both lungs are clear. No pneumothorax or pleural effusion. No acute osseous abnormality. IMPRESSION: No acute cardiopulmonary findings. Electronically Signed   By: Hart Robinsons M.D.   On: 10/08/2023 16:24    Anti-infectives: Anti-infectives (From admission, onward)    Start     Dose/Rate Route Frequency Ordered Stop   10/09/23 1600   vancomycin (VANCOCIN) IVPB 1000 mg/200 mL premix  Status:  Discontinued       Placed in "And" Linked Group   1,000 mg 200 mL/hr over 60 Minutes Intravenous Every 24 hours 10/08/23 1459 10/08/23 2049   10/09/23 1500  vancomycin (VANCOCIN) IVPB 1000 mg/200 mL premix  Status:  Discontinued       Placed in "And" Linked Group   1,000 mg 200 mL/hr over 60 Minutes Intravenous Every 24 hours 10/08/23 1459 10/08/23 2049   10/08/23 2000  piperacillin-tazobactam (ZOSYN) IVPB 3.375 g        3.375 g 12.5 mL/hr over 240 Minutes Intravenous Every 8 hours 10/08/23 1937     10/08/23 1500  vancomycin (VANCOCIN) IVPB 1000 mg/200 mL premix        1,000 mg 200 mL/hr over 60 Minutes Intravenous Every 1 hr x 2 10/08/23 1428 10/08/23 1755   10/08/23 1415  ceFEPIme (MAXIPIME) 2 g in sodium chloride 0.9 % 100 mL IVPB        2 g 200 mL/hr over 30 Minutes Intravenous  Once 10/08/23 1407 10/08/23 1558       Assessment/Plan:  Patient is a 62 year old male who was admitted with acute appendicitis.  He is status post robotic assisted laparoscopic appendectomy on 11/9.  -Patient with improving leukocytosis, 13.3 from 16.4 -Will continue IV Zosyn -Patient with increased abdominal distention, nausea, and vomiting.  NG tube inserted -KUB demonstrates dilated loops of bowel which is most consistent with a postoperative ileus.  NG tube is not visualized on this KUB, so will likely need chest x-ray or repeat KUB.  He had some dilation of his small bowel yesterday intraoperatively, so I am not surprised that he has developed a postoperative ileus. -NG to LIS once position confirmed -NPO -Will order Dulcolax suppositories -IVF per primary team -Appreciate hospitalist recommendations    LOS: 2 days  Ferlin Fairhurst A Ahmar Pickrell 10/10/2023

## 2023-10-10 NOTE — Plan of Care (Signed)

## 2023-10-10 NOTE — Plan of Care (Signed)
  Problem: Health Behavior/Discharge Planning: Goal: Ability to manage health-related needs will improve Outcome: Progressing   Problem: Activity: Goal: Risk for activity intolerance will decrease Outcome: Progressing   Problem: Nutrition: Goal: Adequate nutrition will be maintained Outcome: Progressing   Problem: Pain Management: Goal: General experience of comfort will improve Outcome: Progressing   Problem: Skin Integrity: Goal: Risk for impaired skin integrity will decrease Outcome: Progressing

## 2023-10-11 DIAGNOSIS — I10 Essential (primary) hypertension: Secondary | ICD-10-CM | POA: Diagnosis not present

## 2023-10-11 DIAGNOSIS — K3580 Unspecified acute appendicitis: Secondary | ICD-10-CM | POA: Diagnosis not present

## 2023-10-11 DIAGNOSIS — I6930 Unspecified sequelae of cerebral infarction: Secondary | ICD-10-CM | POA: Diagnosis not present

## 2023-10-11 LAB — BASIC METABOLIC PANEL
Anion gap: 9 (ref 5–15)
BUN: 20 mg/dL (ref 6–20)
CO2: 26 mmol/L (ref 22–32)
Calcium: 8.8 mg/dL — ABNORMAL LOW (ref 8.9–10.3)
Chloride: 104 mmol/L (ref 98–111)
Creatinine, Ser: 1.54 mg/dL — ABNORMAL HIGH (ref 0.61–1.24)
GFR, Estimated: 51 mL/min — ABNORMAL LOW (ref >60)
Glucose, Bld: 120 mg/dL — ABNORMAL HIGH (ref 70–99)
Potassium: 3.3 mmol/L — ABNORMAL LOW (ref 3.5–5.1)
Sodium: 139 mmol/L (ref 135–145)

## 2023-10-11 LAB — CBC
HCT: 42.5 % (ref 39.0–52.0)
Hemoglobin: 14.4 g/dL (ref 13.0–17.0)
MCH: 27.7 pg (ref 26.0–34.0)
MCHC: 33.9 g/dL (ref 30.0–36.0)
MCV: 81.9 fL (ref 80.0–100.0)
Platelets: 201 10*3/uL (ref 150–400)
RBC: 5.19 MIL/uL (ref 4.22–5.81)
RDW: 14 % (ref 11.5–15.5)
WBC: 10.7 10*3/uL — ABNORMAL HIGH (ref 4.0–10.5)
nRBC: 0 % (ref 0.0–0.2)

## 2023-10-11 LAB — MAGNESIUM: Magnesium: 2.2 mg/dL (ref 1.7–2.4)

## 2023-10-11 MED ORDER — PROMETHAZINE HCL 25 MG/ML IJ SOLN
INTRAMUSCULAR | Status: AC
  Filled 2023-10-11: qty 1

## 2023-10-11 MED ORDER — POTASSIUM CHLORIDE 10 MEQ/100ML IV SOLN
10.0000 meq | INTRAVENOUS | Status: AC
  Administered 2023-10-11 (×4): 10 meq via INTRAVENOUS
  Filled 2023-10-11 (×4): qty 100

## 2023-10-11 MED ORDER — SENNOSIDES-DOCUSATE SODIUM 8.6-50 MG PO TABS
1.0000 | ORAL_TABLET | Freq: Two times a day (BID) | ORAL | Status: DC
  Administered 2023-10-11 – 2023-10-15 (×9): 1 via ORAL
  Filled 2023-10-11 (×10): qty 1

## 2023-10-11 MED ORDER — SODIUM CHLORIDE 0.9 % IV SOLN
12.5000 mg | Freq: Once | INTRAVENOUS | Status: AC
  Administered 2023-10-11: 12.5 mg via INTRAVENOUS
  Filled 2023-10-11: qty 0.5

## 2023-10-11 NOTE — Progress Notes (Signed)
Mobility Specialist Progress Note:    10/11/23 1200  Mobility  Activity Transferred to/from BSC;Stood at bedside  Level of Assistance Moderate assist, patient does 50-74%  Assistive Device Four point cane  Distance Ambulated (ft) 4 ft  Range of Motion/Exercises Active;Right arm;Right leg;Active Assistive;Left arm;Left leg  Activity Response Tolerated well  Mobility Referral Yes  $Mobility charge 1 Mobility  Mobility Specialist Start Time (ACUTE ONLY) 1150  Mobility Specialist Stop Time (ACUTE ONLY) 1215  Mobility Specialist Time Calculation (min) (ACUTE ONLY) 25 min   Pt received in bed, agreeable to mobility. Required ModA to stand and transfer to [facility]. Tolerated well, pt needs assistance d/t left side weakness. Left pt on [facility], RN in room. NT notified, all needs met.   Lawerance Bach Mobility Specialist Please contact via Special educational needs teacher or  Rehab office at [phone]

## 2023-10-11 NOTE — Progress Notes (Signed)
NG-tube discontinued per Dr Robyne Peers orders,patient tolerated procedure well. Plan of care on going.

## 2023-10-11 NOTE — Plan of Care (Signed)
  Problem: Nutrition: Goal: Adequate nutrition will be maintained Outcome: Progressing   Problem: Pain Management: Goal: General experience of comfort will improve Outcome: Progressing

## 2023-10-11 NOTE — Progress Notes (Signed)
[facility] Progress Note  2 Days Post-Op  Subjective: Patient seen and examined.  He is resting comfortably in bed.  He has had a couple of bowel movements and has been passing flatus.  He is afraid to pass a lot of flatus right now, because he is concerned that he may have a bowel movement with the flatus.  He has had 1.3 L out of his NG tube since placement, but he has only had 200 cc in the last 12 hours.  His abdominal pain is continuing to improve, and he feels less bloated.  He denies any nausea or vomiting.  Objective: Vital signs in last 24 hours: Temp:  [99.1 F (37.3 C)-99.3 F (37.4 C)] 99.3 F (37.4 C) (11/10 2030) Pulse Rate:  [77-87] 77 (11/11 1029) Resp:  [20] 20 (11/10 2030) BP: (132-153)/(79-95) 153/79 (11/11 1029) SpO2:  [98 %-99 %] 98 % (11/11 1029) Last BM Date : 10/11/23  Intake/Output from previous day: 11/10 0701 - 11/11 0700 In: 217.6 [I.V.:32.2; IV Piggyback:185.4] Out: 2300 [Urine:1000; Emesis/NG output:1300] Intake/Output this shift: No intake/output data recorded.  General appearance: alert, cooperative, and no distress GI: Abdomen soft, mild distention, no percussion tenderness, mild incisional tenderness to palpation; no rigidity, guarding, rebound tenderness  Lab Results:  Recent Labs    10/10/23 0424 10/11/23 0411  WBC 13.3* 10.7*  HGB 13.9 14.4  HCT 40.9 42.5  PLT 189 201   BMET Recent Labs    10/10/23 0424 10/11/23 0411  NA 136 139  K 3.3* 3.3*  CL 104 104  CO2 24 26  GLUCOSE 135* 120*  BUN 17 20  CREATININE 1.60* 1.54*  CALCIUM 8.7* 8.8*   PT/INR Recent Labs    10/08/23 1319  LABPROT 17.8*  INR 1.4*    Studies/Results: DG Abd 1 View  Result Date: 10/10/2023 CLINICAL DATA:  Follow-up of ileus EXAM: ABDOMEN - 1 VIEW COMPARISON:  CT yesterday FINDINGS: 2 supine views of the abdomen and pelvis demonstrate small bowel dilatation up to 5.1 cm, new/increased since yesterday's CT. Normal caliber colon with  large volume stool throughout. Rectal stool ball of 8 cm suggests fecal impaction. No gross free intraperitoneal air. Gas-filled, mildly dilated stomach. IMPRESSION: New/increased small bowel dilatation favoring adynamic ileus versus partial small bowel obstruction. Colonic stool volume suggests constipation or fecal impaction. Electronically Signed   By: Jeronimo Greaves M.D.   On: 10/10/2023 13:46   DG CHEST PORT 1 VIEW  Result Date: 10/10/2023 CLINICAL DATA:  NG tube placement EXAM: PORTABLE CHEST 1 VIEW COMPARISON:  CXR 10/08/23 FINDINGS: No pleural effusion. No pneumothorax. Hazy opacity at the left lung base could represent atelectasis or infection. Enteric tube courses below diaphragm with the tip projecting over the expected location of the stomach and the side hole in the proximal stomach. Gastric gaseous distention. There also multiple prominent loops small bowel in the upper abdomen. Subdiaphragmatic lucency on the right may be artifactual, but if there is clinical concern for pneumoperitoneum further evaluation with a CT of the abdomen and pelvis should be considered. IMPRESSION: 1. Enteric tube courses below diaphragm with the tip projecting over the expected location of the stomach and the side hole in the proximal stomach. 2. Subdiaphragmatic lucency on the right may be artifactual, but if there is clinical concern for pneumoperitoneum further evaluation with a CT of the abdomen and pelvis should be considered. 3. Hazy opacity at the left lung base could represent atelectasis or infection. These results will be called to  the ordering clinician or representative by the Radiologist Assistant, and communication documented in the PACS or [employer]. Electronically Signed   By: Lorenza Cambridge M.D.   On: 10/10/2023 11:20    Anti-infectives: Anti-infectives (From admission, onward)    Start     Dose/Rate Route Frequency Ordered Stop   10/09/23 1600  vancomycin (VANCOCIN) IVPB 1000 mg/200 mL premix   Status:  Discontinued       Placed in "And" Linked Group   1,000 mg 200 mL/hr over 60 Minutes Intravenous Every 24 hours 10/08/23 1459 10/08/23 2049   10/09/23 1500  vancomycin (VANCOCIN) IVPB 1000 mg/200 mL premix  Status:  Discontinued       Placed in "And" Linked Group   1,000 mg 200 mL/hr over 60 Minutes Intravenous Every 24 hours 10/08/23 1459 10/08/23 2049   10/08/23 2000  piperacillin-tazobactam (ZOSYN) IVPB 3.375 g        3.375 g 12.5 mL/hr over 240 Minutes Intravenous Every 8 hours 10/08/23 1937     10/08/23 1500  vancomycin (VANCOCIN) IVPB 1000 mg/200 mL premix        1,000 mg 200 mL/hr over 60 Minutes Intravenous Every 1 hr x 2 10/08/23 1428 10/08/23 1755   10/08/23 1415  ceFEPIme (MAXIPIME) 2 g in sodium chloride 0.9 % 100 mL IVPB        2 g 200 mL/hr over 30 Minutes Intravenous  Once 10/08/23 1407 10/08/23 1558       Assessment/Plan:  Patient is a 62 year old male who was admitted with acute appendicitis.  He is status post robotic assisted laparoscopic appendectomy on 11/9.  -Patient's leukocytosis has almost completely resolved, 10.7 from 13.3 -Okay to stop IV antibiotics today -Patient had NG tube inserted yesterday because he had abdominal distention with concern for ileus and nausea/vomiting.  He has since started having bowel function with improvement in his distention -Plan to remove NG tube today and initiate clear liquid diet -Monitor for tolerance of diet.  Explained to the patient that if he begins to get nauseous and vomit, he may require replacement of NG tube -Continue Dulcolax suppositories.  Will add on Senokot S -IV fluids per primary team -Appreciate hospitalist recommendations   LOS: 3 days    Chasidy Janak A Kevonte Vanecek 10/11/2023

## 2023-10-11 NOTE — Progress Notes (Addendum)
PROGRESS NOTE   COLETON STAMP  JXB:[id number] DOB: May 08, 1961 DOA: 10/08/2023 PCP: Anabel Halon, MD   Chief Complaint  Patient presents with   Weakness   Level of care: Telemetry  Brief Admission History:   62 y.o. male,with past medical history of CVA, with residual left-sided weakness, PE-DVT, on Pradaxa, CKD stage IIIb, hypothyroidism, hypertension, hyperlipidemia, patient presents to ED secondary to weakness, nausea, abdominal pain, subjective fever, he denies cough, congestion, chest pain, or vomiting, no dysuria. -In ED he was noted to be tender in right lower quadrant, CT abdomen pelvis has been obtained which was significant for acute appendicitis, his workup significant For creatinine 1.4, at baseline, low sodium at 3.2, and elevated white blood cell count at 15.4, patient was started on IV antibiotics, ED discussed with general surgery, they will hold on surgery today given he took his Pradaxa this morning, and plan to go to the OR tomorrow, [facility] hospitalist consulted to admit   Assessment and Plan:  Acute appendicitis - Treated -Presented with abdominal pain, nausea, fever, CT abdomen pelvis significant for acute appendicitis -ok to stop IV Zosyn per surgery team;  follow-up blood cultures NGTD -postop s/p robotic lap appendectomy 10/09/23   Ileus -Pt developed abd distension, nausea and vomiting postop -discussed with Dr. Robyne Peers, make NPO, placed NG tube  -supportive measures given -pt having bowel movements now   Hypertension> hypotension (resolved) -His blood pressure is on the lower side, responded well to IV fluids, as discussed with the patient reports his blood pressure has been on the lower side for few weeks now Holding home antihypertensive medications    Obesity last 2 Body mass index is 35.94 kg/m   History of CVA with residual left-sided deficits -Continue with aspirin, to start postoperatively, continue with statin    History of  DVT -temporarily holding Pradaxa in the perioperative period per surgery   Hypokalemia - being repleted with IV KCl - Mg is 2.2 (WNL)    Hypothyroidism - resumed home levothyroxine   Mixed hyperlipidemia - Continue home statin    Chronic kidney disease, stage 3 B -At baseline, remains on IV fluids  DVT prophylaxis:  SCD Code Status: Full  Family Communication:  Disposition: anticipate home    Consultants:  Surgery  Procedures:  Robotic lap appy 10/09/23 with Dr. Robyne Peers  Antimicrobials:  Zosyn 11/8>>   Subjective: Pt says that he is having bowel movements.  He is having another one this morning.   Objective: Vitals:   10/09/23 2237 10/10/23 0506 10/10/23 1336 10/10/23 2030  BP: 111/72 119/74 139/87 (!) 132/95  Pulse: 85 93 87 78  Resp:  18  20  Temp:  99.7 F (37.6 C) 99.1 F (37.3 C) 99.3 F (37.4 C)  TempSrc:  Oral Oral   SpO2:  96% 98% 99%  Weight:      Height:        Intake/Output Summary (Last 24 hours) at 10/11/2023 1006 Last data filed at 10/11/2023 0600 Gross per 24 hour  Intake 217.61 ml  Output 2300 ml  Net -2082.39 ml   Filed Weights   10/08/23 1254  Weight: 120.2 kg   Examination:  General exam: Appears calm and comfortable  Respiratory system: Clear to auscultation. Respiratory effort normal. Cardiovascular system: normal S1 & S2 heard. No JVD, murmurs, rubs, gallops or clicks. No pedal edema. Gastrointestinal system: Abdomen is less distended, mildly tender. No organomegaly or masses felt. hypoactive bowel sounds. Central nervous system: Alert and oriented. No focal  neurological deficits. Extremities: Symmetric 5 x 5 power. Skin: No rashes, lesions or ulcers. Psychiatry: Judgement and insight appear normal. Mood & affect appropriate.   Data Reviewed: I have personally reviewed following labs and imaging studies  CBC: Recent Labs  Lab 10/08/23 1319 10/09/23 0352 10/10/23 0424 10/11/23 0411  WBC 15.4* 16.4* 13.3* 10.7*   NEUTROABS 12.7*  --  11.3*  --   HGB 14.9 13.3 13.9 14.4  HCT 44.4 38.9* 40.9 42.5  MCV 81.8 81.6 82.3 81.9  PLT 225 200 189 201    Basic Metabolic Panel: Recent Labs  Lab 10/08/23 1319 10/09/23 0352 10/10/23 0424 10/11/23 0411  NA 138 137 136 139  K 3.2* 3.4* 3.3* 3.3*  CL 102 105 104 104  CO2 26 27 24 26   GLUCOSE 117* 102* 135* 120*  BUN 21* 17 17 20   CREATININE 1.45* 1.35* 1.60* 1.54*  CALCIUM 8.6* 8.4* 8.7* 8.8*  MG  --   --   --  2.2    CBG: No results for input(s): "GLUCAP" in the last 168 hours.  Recent Results (from the past 240 hour(s))  Resp panel by RT-PCR (RSV, Flu A&B, Covid) Anterior Nasal Swab     Status: None   Collection Time: 10/08/23  1:00 PM   Specimen: Anterior Nasal Swab  Result Value Ref Range Status   SARS Coronavirus 2 by RT PCR NEGATIVE NEGATIVE Final    Comment: (NOTE) SARS-CoV-2 target nucleic acids are NOT DETECTED.  The SARS-CoV-2 RNA is generally detectable in upper respiratory specimens during the acute phase of infection. The lowest concentration of SARS-CoV-2 viral copies this assay can detect is 138 copies/mL. A negative result does not preclude SARS-Cov-2 infection and should not be used as the sole basis for treatment or other patient management decisions. A negative result may occur with  improper specimen collection/handling, submission of specimen other than nasopharyngeal swab, presence of viral mutation(s) within the areas targeted by this assay, and inadequate number of viral copies(<138 copies/mL). A negative result must be combined with clinical observations, patient history, and epidemiological information. The expected result is Negative.  Fact Sheet for Patients:  BloggerCourse.com  Fact Sheet for Healthcare Providers:  SeriousBroker.it  This test is no t yet approved or cleared by the [place] FDA and  has been authorized for detection and/or diagnosis of  SARS-CoV-2 by FDA under an Emergency Use Authorization (EUA). This EUA will remain  in effect (meaning this test can be used) for the duration of the COVID-19 declaration under Section 564(b)(1) of the Act, 21 U.S.C.section 360bbb-3(b)(1), unless the authorization is terminated  or revoked sooner.       Influenza A by PCR NEGATIVE NEGATIVE Final   Influenza B by PCR NEGATIVE NEGATIVE Final    Comment: (NOTE) The Xpert Xpress SARS-CoV-2/FLU/RSV plus assay is intended as an aid in the diagnosis of influenza from Nasopharyngeal swab specimens and should not be used as a sole basis for treatment. Nasal washings and aspirates are unacceptable for Xpert Xpress SARS-CoV-2/FLU/RSV testing.  Fact Sheet for Patients: BloggerCourse.com  Fact Sheet for Healthcare Providers: SeriousBroker.it  This test is not yet approved or cleared by the [place] FDA and has been authorized for detection and/or diagnosis of SARS-CoV-2 by FDA under an Emergency Use Authorization (EUA). This EUA will remain in effect (meaning this test can be used) for the duration of the COVID-19 declaration under Section 564(b)(1) of the Act, 21 U.S.C. section 360bbb-3(b)(1), unless the authorization is terminated or revoked.  Resp Syncytial Virus by PCR NEGATIVE NEGATIVE Final    Comment: (NOTE) Fact Sheet for Patients: BloggerCourse.com  Fact Sheet for Healthcare Providers: SeriousBroker.it  This test is not yet approved or cleared by the [place] FDA and has been authorized for detection and/or diagnosis of SARS-CoV-2 by FDA under an Emergency Use Authorization (EUA). This EUA will remain in effect (meaning this test can be used) for the duration of the COVID-19 declaration under Section 564(b)(1) of the Act, 21 U.S.C. section 360bbb-3(b)(1), unless the authorization is terminated  or revoked.  Performed at [facility], [address] [place], [place] [address]   Blood Culture (routine x 2)     Status: None (Preliminary result)   Collection Time: 10/08/23  1:19 PM   Specimen: BLOOD  Result Value Ref Range Status   Specimen Description BLOOD rt forearm  Final   Special Requests   Final    BOTTLES DRAWN AEROBIC AND ANAEROBIC Blood Culture adequate volume   Culture   Final    NO GROWTH 3 DAYS Performed at [facility], [address], [place], [place] [address]    Report Status PENDING  Incomplete  Blood Culture (routine x 2)     Status: None (Preliminary result)   Collection Time: 10/08/23  1:20 PM   Specimen: BLOOD  Result Value Ref Range Status   Specimen Description BLOOD BLOOD RIGHT HAND  Final   Special Requests   Final    BOTTLES DRAWN AEROBIC AND ANAEROBIC Blood Culture results may not be optimal due to an excessive volume of blood received in culture bottles   Culture   Final    NO GROWTH 3 DAYS Performed at [facility], [address], [place], [place] [address]    Report Status PENDING  Incomplete  Surgical pcr screen     Status: None   Collection Time: 10/09/23  7:30 AM   Specimen: Nasal Mucosa; Nasal Swab  Result Value Ref Range Status   MRSA, PCR NEGATIVE NEGATIVE Final   Staphylococcus aureus NEGATIVE NEGATIVE Final    Comment: (NOTE) The Xpert SA Assay (FDA approved for NASAL specimens in patients 79 years of age and older), is one component of a comprehensive surveillance program. It is not intended to diagnose infection nor to guide or monitor treatment. Performed at [facility], [address] [place], [place] [address]      Radiology Studies: DG Abd 1 View  Result Date: 10/10/2023 CLINICAL DATA:  Follow-up of ileus EXAM: ABDOMEN - 1 VIEW COMPARISON:  CT yesterday FINDINGS: 2 supine views of the abdomen and pelvis demonstrate small bowel dilatation up to 5.1 cm, new/increased since yesterday's CT. Normal caliber colon with large  volume stool throughout. Rectal stool ball of 8 cm suggests fecal impaction. No gross free intraperitoneal air. Gas-filled, mildly dilated stomach. IMPRESSION: New/increased small bowel dilatation favoring adynamic ileus versus partial small bowel obstruction. Colonic stool volume suggests constipation or fecal impaction. Electronically Signed   By: Jeronimo Greaves M.D.   On: 10/10/2023 13:46   DG CHEST PORT 1 VIEW  Result Date: 10/10/2023 CLINICAL DATA:  NG tube placement EXAM: PORTABLE CHEST 1 VIEW COMPARISON:  CXR 10/08/23 FINDINGS: No pleural effusion. No pneumothorax. Hazy opacity at the left lung base could represent atelectasis or infection. Enteric tube courses below diaphragm with the tip projecting over the expected location of the stomach and the side hole in the proximal stomach. Gastric gaseous distention. There also multiple prominent loops small bowel in the upper abdomen.  Subdiaphragmatic lucency on the right may be artifactual, but if there is clinical concern for pneumoperitoneum further evaluation with a CT of the abdomen and pelvis should be considered. IMPRESSION: 1. Enteric tube courses below diaphragm with the tip projecting over the expected location of the stomach and the side hole in the proximal stomach. 2. Subdiaphragmatic lucency on the right may be artifactual, but if there is clinical concern for pneumoperitoneum further evaluation with a CT of the abdomen and pelvis should be considered. 3. Hazy opacity at the left lung base could represent atelectasis or infection. These results will be called to the ordering clinician or representative by the Radiologist Assistant, and communication documented in the PACS or [employer]. Electronically Signed   By: Lorenza Cambridge M.D.   On: 10/10/2023 11:20    Scheduled Meds:  acetaminophen  1,000 mg Oral Q6H   aspirin EC  81 mg Oral Daily   atorvastatin  40 mg Oral Daily   bisacodyl  10 mg Rectal Daily   levothyroxine  25 mcg Oral Q0600    pantoprazole  40 mg Oral Daily   tamsulosin  0.4 mg Oral QPC supper   Continuous Infusions:  lactated ringers 50 mL/hr at 10/10/23 1734   piperacillin-tazobactam (ZOSYN)  IV 3.375 g (10/11/23 0557)   potassium chloride      LOS: 3 days   Time spent: 54 mins  Pankaj Haack Laural Benes, MD How to contact the [facility] Attending or Consulting provider 7A - 7P or covering provider during after hours 7P -7A, for this patient?  Check the care team in [facility] and look for a) attending/consulting [facility] provider listed and b) the [facility] team listed Log into [email or web address] to find provider on call.  Locate the [facility] provider you are looking for under [facility] and page to a number that you can be directly reached. If you still have difficulty reaching the provider, please page the [facility] (Director on Call) for the Hospitalists listed on amion for assistance.  10/11/2023, 10:06 AM

## 2023-10-12 ENCOUNTER — Ambulatory Visit: Payer: Self-pay

## 2023-10-12 ENCOUNTER — Inpatient Hospital Stay (HOSPITAL_COMMUNITY): Payer: 59

## 2023-10-12 DIAGNOSIS — I10 Essential (primary) hypertension: Secondary | ICD-10-CM | POA: Diagnosis not present

## 2023-10-12 DIAGNOSIS — I6930 Unspecified sequelae of cerebral infarction: Secondary | ICD-10-CM | POA: Diagnosis not present

## 2023-10-12 DIAGNOSIS — K3580 Unspecified acute appendicitis: Secondary | ICD-10-CM | POA: Diagnosis not present

## 2023-10-12 LAB — CBC WITH DIFFERENTIAL/PLATELET
Abs Immature Granulocytes: 0.04 10*3/uL (ref 0.00–0.07)
Basophils Absolute: 0 10*3/uL (ref 0.0–0.1)
Basophils Relative: 0 %
Eosinophils Absolute: 0.1 10*3/uL (ref 0.0–0.5)
Eosinophils Relative: 1 %
HCT: 43.3 % (ref 39.0–52.0)
Hemoglobin: 14.4 g/dL (ref 13.0–17.0)
Immature Granulocytes: 0 %
Lymphocytes Relative: 6 %
Lymphs Abs: 0.7 10*3/uL (ref 0.7–4.0)
MCH: 27.6 pg (ref 26.0–34.0)
MCHC: 33.3 g/dL (ref 30.0–36.0)
MCV: 83 fL (ref 80.0–100.0)
Monocytes Absolute: 1 10*3/uL (ref 0.1–1.0)
Monocytes Relative: 8 %
Neutro Abs: 10.3 10*3/uL — ABNORMAL HIGH (ref 1.7–7.7)
Neutrophils Relative %: 85 %
Platelets: 234 10*3/uL (ref 150–400)
RBC: 5.22 MIL/uL (ref 4.22–5.81)
RDW: 14.1 % (ref 11.5–15.5)
WBC: 12.1 10*3/uL — ABNORMAL HIGH (ref 4.0–10.5)
nRBC: 0 % (ref 0.0–0.2)

## 2023-10-12 LAB — BASIC METABOLIC PANEL
Anion gap: 10 (ref 5–15)
BUN: 19 mg/dL (ref 6–20)
CO2: 30 mmol/L (ref 22–32)
Calcium: 9.1 mg/dL (ref 8.9–10.3)
Chloride: 103 mmol/L (ref 98–111)
Creatinine, Ser: 1.46 mg/dL — ABNORMAL HIGH (ref 0.61–1.24)
GFR, Estimated: 55 mL/min — ABNORMAL LOW (ref >60)
Glucose, Bld: 132 mg/dL — ABNORMAL HIGH (ref 70–99)
Potassium: 4 mmol/L (ref 3.5–5.1)
Sodium: 143 mmol/L (ref 135–145)

## 2023-10-12 LAB — SURGICAL PATHOLOGY

## 2023-10-12 MED ORDER — DIPHENHYDRAMINE HCL 50 MG/ML IJ SOLN
25.0000 mg | Freq: Once | INTRAMUSCULAR | Status: AC
  Administered 2023-10-12: 25 mg via INTRAVENOUS
  Filled 2023-10-12: qty 1

## 2023-10-12 MED ORDER — AMLODIPINE BESYLATE 5 MG PO TABS
5.0000 mg | ORAL_TABLET | Freq: Every day | ORAL | Status: DC
  Administered 2023-10-12 – 2023-10-14 (×3): 5 mg via ORAL
  Filled 2023-10-12 (×3): qty 1

## 2023-10-12 MED ORDER — CHLORPROMAZINE HCL 25 MG PO TABS
25.0000 mg | ORAL_TABLET | Freq: Three times a day (TID) | ORAL | Status: DC | PRN
  Administered 2023-10-13: 25 mg via ORAL
  Filled 2023-10-12 (×2): qty 1

## 2023-10-12 MED ORDER — DABIGATRAN ETEXILATE MESYLATE 150 MG PO CAPS
150.0000 mg | ORAL_CAPSULE | Freq: Two times a day (BID) | ORAL | Status: DC
  Administered 2023-10-12 – 2023-10-15 (×7): 150 mg via ORAL
  Filled 2023-10-12 (×7): qty 1

## 2023-10-12 MED ORDER — LISINOPRIL 5 MG PO TABS
2.5000 mg | ORAL_TABLET | Freq: Every day | ORAL | Status: DC
  Administered 2023-10-12 – 2023-10-14 (×3): 2.5 mg via ORAL
  Filled 2023-10-12 (×4): qty 1

## 2023-10-12 MED ORDER — PROCHLORPERAZINE EDISYLATE 10 MG/2ML IJ SOLN
10.0000 mg | INTRAMUSCULAR | Status: DC | PRN
  Administered 2023-10-13 (×3): 10 mg via INTRAVENOUS
  Filled 2023-10-12 (×4): qty 2

## 2023-10-12 MED ORDER — PROCHLORPERAZINE EDISYLATE 10 MG/2ML IJ SOLN
10.0000 mg | Freq: Once | INTRAMUSCULAR | Status: AC
  Administered 2023-10-12: 10 mg via INTRAVENOUS
  Filled 2023-10-12: qty 2

## 2023-10-12 MED ORDER — HYDRALAZINE HCL 20 MG/ML IJ SOLN: 10.0000 mg | INTRAMUSCULAR | Status: DC | PRN

## 2023-10-12 MED ORDER — PROCHLORPERAZINE EDISYLATE 10 MG/2ML IJ SOLN: 10.0000 mg | INTRAMUSCULAR | Status: DC | PRN

## 2023-10-12 MED ORDER — LEVETIRACETAM 500 MG PO TABS
500.0000 mg | ORAL_TABLET | Freq: Two times a day (BID) | ORAL | Status: DC
  Administered 2023-10-12 – 2023-10-15 (×8): 500 mg via ORAL
  Filled 2023-10-12 (×8): qty 1

## 2023-10-12 NOTE — Progress Notes (Deleted)
Patient to go to [facility] IR tomorrow 11/13. [facility] set up to pick up patient at 0730.

## 2023-10-12 NOTE — Progress Notes (Signed)
[facility] Progress Note  3 Days Post-Op  Subjective: Patient seen and examined.  He is resting comfortably in bed.  He complained of nausea with an episode of emesis this morning after drinking his clear liquids.  He denies abdominal pain at this time.  He confirms passing flatus and having bowel movements.  Objective: Vital signs in last 24 hours: Temp:  [98.4 F (36.9 C)-98.6 F (37 C)] 98.4 F (36.9 C) (11/12 0410) Pulse Rate:  [79-93] 93 (11/12 0410) Resp:  [16] 16 (11/12 1304) BP: (133-188)/(82-105) 133/82 (11/12 1304) SpO2:  [93 %-100 %] 95 % (11/12 1304) Last BM Date : 10/12/23  Intake/Output from previous day: 11/11 0701 - 11/12 0700 In: 382.8 [IV Piggyback:382.8] Out: 400 [Urine:400] Intake/Output this shift: Total I/O In: 284.1 [P.O.:240; IV Piggyback:44.1] Out: 500 [Urine:500]  General appearance: alert, cooperative, and no distress GI: Abdomen soft, mild distention, no percussion tenderness, nontender to palpation; no rigidity, guarding, rebound tenderness; incisions C/D/I with Dermabond in place  Lab Results:  Recent Labs    10/11/23 0411 10/12/23 0413  WBC 10.7* 12.1*  HGB 14.4 14.4  HCT 42.5 43.3  PLT 201 234   BMET Recent Labs    10/11/23 0411 10/12/23 0413  NA 139 143  K 3.3* 4.0  CL 104 103  CO2 26 30  GLUCOSE 120* 132*  BUN 20 19  CREATININE 1.54* 1.46*  CALCIUM 8.8* 9.1   PT/INR No results for input(s): "LABPROT", "INR" in the last 72 hours.  Studies/Results: No results found.  Anti-infectives: Anti-infectives (From admission, onward)    Start     Dose/Rate Route Frequency Ordered Stop   10/09/23 1600  vancomycin (VANCOCIN) IVPB 1000 mg/200 mL premix  Status:  Discontinued       Placed in "And" Linked Group   1,000 mg 200 mL/hr over 60 Minutes Intravenous Every 24 hours 10/08/23 1459 10/08/23 2049   10/09/23 1500  vancomycin (VANCOCIN) IVPB 1000 mg/200 mL premix  Status:  Discontinued       Placed in "And"  Linked Group   1,000 mg 200 mL/hr over 60 Minutes Intravenous Every 24 hours 10/08/23 1459 10/08/23 2049   10/08/23 2000  piperacillin-tazobactam (ZOSYN) IVPB 3.375 g  Status:  Discontinued        3.375 g 12.5 mL/hr over 240 Minutes Intravenous Every 8 hours 10/08/23 1937 10/11/23 1051   10/08/23 1500  vancomycin (VANCOCIN) IVPB 1000 mg/200 mL premix        1,000 mg 200 mL/hr over 60 Minutes Intravenous Every 1 hr x 2 10/08/23 1428 10/08/23 1755   10/08/23 1415  ceFEPIme (MAXIPIME) 2 g in sodium chloride 0.9 % 100 mL IVPB        2 g 200 mL/hr over 30 Minutes Intravenous  Once 10/08/23 1407 10/08/23 1558       Assessment/Plan:  Patient is a 62 year old male who was admitted with acute appendicitis.  He is status post robotic assisted laparoscopic appendectomy on 11/9.   -Slight bump in leukocytosis today to 12.7 from 10.7 -Will repeat CBC tomorrow morning -Patient with nausea and emesis with clear liquids today.  Patient currently is without nausea and vomiting.  Will hold off on replacement of NG tube at this time.  Advised the patient can continue with clear liquids, though if he gets nauseous or vomits again, he will need NG tube replacement -KUB is demonstrating persistently dilated loops of bowel and stool ball in the rectum -Will order enema for stool ball -Continue  bowel regimen and Dulcolax suppositories -IV fluids per primary team -Appreciate hospitalist recommendations   LOS: 4 days    Danaiya Steadman A Latressa Harries 10/12/2023 Regardless

## 2023-10-12 NOTE — Progress Notes (Addendum)
Mobility Specialist Progress Note:    10/12/23 1140  Mobility  Activity Transferred from bed to chair;Stood at bedside  Level of Assistance Minimal assist, patient does 75% or more  Assistive Device Four point cane  Distance Ambulated (ft) 4 ft  Range of Motion/Exercises Active;Right arm;Right leg;Active Assistive;Left arm;Left leg  Activity Response Tolerated well  Mobility Referral Yes  $Mobility charge 1 Mobility  Mobility Specialist Start Time (ACUTE ONLY) 1135  Mobility Specialist Stop Time (ACUTE ONLY) 1150  Mobility Specialist Time Calculation (min) (ACUTE ONLY) 15 min   Pt received in bed, agreeable to mobility. Nurse and NT assisted with peri care and transfer of pt. Required MinA to stand and transfer to chair with quad cane. Tolerated well, pt has left side weakness. Left pt in chair, nurse in room. All needs met.   Lawerance Bach Mobility Specialist Please contact via Special educational needs teacher or  Rehab office at [phone]

## 2023-10-12 NOTE — Patient Instructions (Signed)
Visit Information  Thank you for taking time to visit with me today. Please don't hesitate to contact me if I can be of assistance to you.   Following are the goals we discussed today:  Patient will speak to hospital staff regarding personal care.   Our next appointment is by telephone on 10/19/23 at 2Pm  Please call the care guide team at [phone] if you need to cancel or reschedule your appointment.   If you are experiencing a Mental Health or Behavioral Health Crisis or need someone to talk to, please call 911  Patient verbalizes understanding of instructions and care plan provided today and agrees to view in MyChart. Active MyChart status and patient understanding of how to access instructions and care plan via MyChart confirmed with patient.     Telephone follow up appointment with care management team member scheduled for:10/19/23 at 2pm.  Lysle Morales, BSW Social Worker  [phone]

## 2023-10-12 NOTE — Progress Notes (Signed)
Pt given IV Compazine and IV Benadryl around 0330 per order for vomiting. Pt has not had any episodes since these medications were administered. Pt says he does feel some relief. Plan of care ongoing.

## 2023-10-12 NOTE — Progress Notes (Addendum)
PROGRESS NOTE   Matthew Wise  ZOX:[id number] DOB: [birth year] DOA: 10/08/2023 PCP: Anabel Halon, MD   Chief Complaint  Patient presents with   Weakness   Level of care: Telemetry  Brief Admission History:   62 y.o. male,with past medical history of CVA, with residual left-sided weakness, PE-DVT, on Pradaxa, CKD stage IIIb, hypothyroidism, hypertension, hyperlipidemia, patient presents to ED secondary to weakness, nausea, abdominal pain, subjective fever, he denies cough, congestion, chest pain, or vomiting, no dysuria. -In ED he was noted to be tender in right lower quadrant, CT abdomen pelvis has been obtained which was significant for acute appendicitis, his workup significant For creatinine 1.4, at baseline, low sodium at 3.2, and elevated white blood cell count at 15.4, patient was started on IV antibiotics, ED discussed with general surgery, they will hold on surgery today given he took his Pradaxa this morning, and plan to go to the OR tomorrow, [facility] hospitalist consulted to admit   Assessment and Plan:  Acute appendicitis - Treated -Presented with abdominal pain, nausea, fever, CT abdomen pelvis significant for acute appendicitis -completed IV Zosyn per surgery team;  follow-up blood cultures NGTD -postop s/p robotic lap appendectomy 10/09/23   Ileus -Pt developed abd distension, nausea and vomiting postop day 1 -discussed with Dr. Robyne Peers, made NPO, placed NG tube  -supportive measures given and slowly improved to advance diet  -pt having bowel movements    Primary Hypertension>> hypotension (resolved) -restarted home antihypertensive medications    Obesity last 2 Body mass index is 35.94 kg/m   History of CVA with residual left-sided deficits -Continue with aspirin, continue with statin    History of DVT -resumed Pradaxa after postop day 2 per surgery recs   Hypokalemia - repleted with IV KCl - Mg is 2.2 (WNL)   Hiccups - thorazine PRN    Hypothyroidism - resumed home levothyroxine   Mixed hyperlipidemia - Continue home statin    Chronic kidney disease, stage 3 B -At baseline, completed IVF   DVT prophylaxis:  SCD Code Status: Full  Family Communication:  Disposition: anticipate home when cleared by surgery    Consultants:  Surgery  Procedures:  Robotic lap appy 10/09/23 with Dr. Robyne Peers  Antimicrobials:  Zosyn 11/8>>   Subjective: Says he feels bloated, nauseated.     Objective: Vitals:   10/11/23 1029 10/11/23 1429 10/11/23 2008 10/12/23 0410  BP: (!) 153/79 (!) 153/95 (!) 188/96 (!) 175/105  Pulse: 77 91 79 93  Resp:      Temp:  98.6 F (37 C) 98.5 F (36.9 C) 98.4 F (36.9 C)  TempSrc:  Oral Oral Axillary  SpO2: 98% 100% 95% 93%  Weight:      Height:        Intake/Output Summary (Last 24 hours) at 10/12/2023 1139 Last data filed at 10/12/2023 0900 Gross per 24 hour  Intake 546.86 ml  Output 400 ml  Net 146.86 ml   Filed Weights   10/08/23 1254  Weight: 120.2 kg   Examination:  General exam: Appears calm and comfortable  Respiratory system: Clear to auscultation. Respiratory effort normal. Cardiovascular system: normal S1 & S2 heard. No JVD, murmurs, rubs, gallops or clicks. No pedal edema. Gastrointestinal system: Abdomen distended, mildly tender. No organomegaly or masses felt. hypoactive bowel sounds. Central nervous system: Alert and oriented. No focal neurological deficits. Extremities: Symmetric 5 x 5 power. Skin: No rashes, lesions or ulcers. Psychiatry: Judgement and insight appear normal. Mood & affect appropriate.   Data Reviewed:  I have personally reviewed following labs and imaging studies  CBC: Recent Labs  Lab 10/08/23 1319 10/09/23 0352 10/10/23 0424 10/11/23 0411 10/12/23 0413  WBC 15.4* 16.4* 13.3* 10.7* 12.1*  NEUTROABS 12.7*  --  11.3*  --  10.3*  HGB 14.9 13.3 13.9 14.4 14.4  HCT 44.4 38.9* 40.9 42.5 43.3  MCV 81.8 81.6 82.3 81.9 83.0  PLT 225 200  189 201 234    Basic Metabolic Panel: Recent Labs  Lab 10/08/23 1319 10/09/23 0352 10/10/23 0424 10/11/23 0411 10/12/23 0413  NA 138 137 136 139 143  K 3.2* 3.4* 3.3* 3.3* 4.0  CL 102 105 104 104 103  CO2 26 27 24 26 30   GLUCOSE 117* 102* 135* 120* 132*  BUN 21* 17 17 20 19   CREATININE 1.45* 1.35* 1.60* 1.54* 1.46*  CALCIUM 8.6* 8.4* 8.7* 8.8* 9.1  MG  --   --   --  2.2  --     CBG: No results for input(s): "GLUCAP" in the last 168 hours.  Recent Results (from the past 240 hour(s))  Resp panel by RT-PCR (RSV, Flu A&B, Covid) Anterior Nasal Swab     Status: None   Collection Time: 10/08/23  1:00 PM   Specimen: Anterior Nasal Swab  Result Value Ref Range Status   SARS Coronavirus 2 by RT PCR NEGATIVE NEGATIVE Final    Comment: (NOTE) SARS-CoV-2 target nucleic acids are NOT DETECTED.  The SARS-CoV-2 RNA is generally detectable in upper respiratory specimens during the acute phase of infection. The lowest concentration of SARS-CoV-2 viral copies this assay can detect is 138 copies/mL. A negative result does not preclude SARS-Cov-2 infection and should not be used as the sole basis for treatment or other patient management decisions. A negative result may occur with  improper specimen collection/handling, submission of specimen other than nasopharyngeal swab, presence of viral mutation(s) within the areas targeted by this assay, and inadequate number of viral copies(<138 copies/mL). A negative result must be combined with clinical observations, patient history, and epidemiological information. The expected result is Negative.  Fact Sheet for Patients:  BloggerCourse.com  Fact Sheet for Healthcare Providers:  SeriousBroker.it  This test is no t yet approved or cleared by the [place] FDA and  has been authorized for detection and/or diagnosis of SARS-CoV-2 by FDA under an Emergency Use Authorization (EUA). This EUA  will remain  in effect (meaning this test can be used) for the duration of the COVID-19 declaration under Section 564(b)(1) of the Act, 21 U.S.C.section 360bbb-3(b)(1), unless the authorization is terminated  or revoked sooner.       Influenza A by PCR NEGATIVE NEGATIVE Final   Influenza B by PCR NEGATIVE NEGATIVE Final    Comment: (NOTE) The Xpert Xpress SARS-CoV-2/FLU/RSV plus assay is intended as an aid in the diagnosis of influenza from Nasopharyngeal swab specimens and should not be used as a sole basis for treatment. Nasal washings and aspirates are unacceptable for Xpert Xpress SARS-CoV-2/FLU/RSV testing.  Fact Sheet for Patients: BloggerCourse.com  Fact Sheet for Healthcare Providers: SeriousBroker.it  This test is not yet approved or cleared by the [place] FDA and has been authorized for detection and/or diagnosis of SARS-CoV-2 by FDA under an Emergency Use Authorization (EUA). This EUA will remain in effect (meaning this test can be used) for the duration of the COVID-19 declaration under Section 564(b)(1) of the Act, 21 U.S.C. section 360bbb-3(b)(1), unless the authorization is terminated or revoked.     Resp Syncytial Virus  by PCR NEGATIVE NEGATIVE Final    Comment: (NOTE) Fact Sheet for Patients: BloggerCourse.com  Fact Sheet for Healthcare Providers: SeriousBroker.it  This test is not yet approved or cleared by the [place] FDA and has been authorized for detection and/or diagnosis of SARS-CoV-2 by FDA under an Emergency Use Authorization (EUA). This EUA will remain in effect (meaning this test can be used) for the duration of the COVID-19 declaration under Section 564(b)(1) of the Act, 21 U.S.C. section 360bbb-3(b)(1), unless the authorization is terminated or revoked.  Performed at [facility], [address], [place], [place] [address]   Blood  Culture (routine x 2)     Status: None (Preliminary result)   Collection Time: 10/08/23  1:19 PM   Specimen: BLOOD  Result Value Ref Range Status   Specimen Description BLOOD rt forearm  Final   Special Requests   Final    BOTTLES DRAWN AEROBIC AND ANAEROBIC Blood Culture adequate volume   Culture   Final    NO GROWTH 4 DAYS Performed at [facility], [address] [place], [place] [address]    Report Status PENDING  Incomplete  Blood Culture (routine x 2)     Status: None (Preliminary result)   Collection Time: 10/08/23  1:20 PM   Specimen: BLOOD  Result Value Ref Range Status   Specimen Description BLOOD BLOOD RIGHT HAND  Final   Special Requests   Final    BOTTLES DRAWN AEROBIC AND ANAEROBIC Blood Culture results may not be optimal due to an excessive volume of blood received in culture bottles   Culture   Final    NO GROWTH 4 DAYS Performed at [facility], [address] [place], [place] [address]    Report Status PENDING  Incomplete  Surgical pcr screen     Status: None   Collection Time: 10/09/23  7:30 AM   Specimen: Nasal Mucosa; Nasal Swab  Result Value Ref Range Status   MRSA, PCR NEGATIVE NEGATIVE Final   Staphylococcus aureus NEGATIVE NEGATIVE Final    Comment: (NOTE) The Xpert SA Assay (FDA approved for NASAL specimens in patients 72 years of age and older), is one component of a comprehensive surveillance program. It is not intended to diagnose infection nor to guide or monitor treatment. Performed at [facility], [address], [place], [place] [address]      Radiology Studies: No results found.  Scheduled Meds:  acetaminophen  1,000 mg Oral Q6H   aspirin EC  81 mg Oral Daily   atorvastatin  40 mg Oral Daily   bisacodyl  10 mg Rectal Daily   levothyroxine  25 mcg Oral Q0600   pantoprazole  40 mg Oral Daily   senna-docusate  1 tablet Oral BID   tamsulosin  0.4 mg Oral QPC supper   Continuous Infusions:   LOS: 4 days   Time spent: 52  mins  Traylon Schimming Laural Benes, MD How to contact the [facility] Attending or Consulting provider 7A - 7P or covering provider during after hours 7P -7A, for this patient?  Check the care team in [facility] and look for a) attending/consulting [facility] provider listed and b) the [facility] team listed Log into [email or web address] to find provider on call.  Locate the [facility] provider you are looking for under [facility] and page to a number that you can be directly reached. If you still have difficulty reaching the provider, please page the [facility] (Director on Call) for the Hospitalists listed on amion for assistance.  10/12/2023,  11:39 AM

## 2023-10-12 NOTE — Care Management Important Message (Signed)
Important Message  Patient Details  Name: Matthew Wise MRN: [id number] Date of Birth: [birth year]   Important Message Given:  N/A - LOS <3 / Initial given by admissions     Corey Harold 10/12/2023, 2:32 PM

## 2023-10-12 NOTE — Plan of Care (Signed)
  Problem: Education: Goal: Knowledge of General Education information will improve Description: Including pain rating scale, medication(s)/side effects and non-pharmacologic comfort measures Outcome: Progressing   Problem: Health Behavior/Discharge Planning: Goal: Ability to manage health-related needs will improve Outcome: Not Met (add Reason)   Problem: Clinical Measurements: Goal: Ability to maintain clinical measurements within normal limits will improve Outcome: Progressing Goal: Diagnostic test results will improve Outcome: Not Met (add Reason)   Problem: Activity: Goal: Risk for activity intolerance will decrease Outcome: Not Met (add Reason)   Problem: Nutrition: Goal: Adequate nutrition will be maintained Outcome: Not Met (add Reason)

## 2023-10-12 NOTE — Patient Outreach (Signed)
  Care Coordination   Initial Visit Note   10/12/2023 Name: DATHAN BANASZEWSKI MRN: [id number] DOB: Apr 09, 1961  KRISTOS MAINES is a 62 y.o. year old male who sees Anabel Halon, MD for primary care. I spoke with  [name] by phone today.  What matters to the patients health and wellness today?  Patient wants personal care services and is currently in the hospital.  Request Social Worker follow up at a later time.    Goals Addressed             This Visit's Progress    Personal Care       Interventions Today    Flowsheet Row Most Recent Value  General Interventions   General Interventions Discussed/Reviewed General Interventions Discussed, General Interventions Reviewed, Level of Care  [Pt is in the hospital. Request to speak with SW another day.  SW does suggest pt address PCS services prior to discharge with hospital staff to see if it can be arranged through his insurance due to the hospitalization.]              SDOH assessments and interventions completed:  No     Care Coordination Interventions:  Yes, provided   Follow up plan: Follow up call scheduled for 10/19/23 at 2pm.    Encounter Outcome:  Patient Visit Completed

## 2023-10-12 NOTE — Progress Notes (Signed)
Pt c/o nausea not relieved by PRN Zofran, new orders for IV Phenergan which was effective for a few hours. Around 0150 pt began vomiting. PRN Zofran administered and pt continued to have vomiting episodes. New orders placed. Plan of care ongoing.

## 2023-10-13 DIAGNOSIS — I69354 Hemiplegia and hemiparesis following cerebral infarction affecting left non-dominant side: Secondary | ICD-10-CM | POA: Diagnosis not present

## 2023-10-13 DIAGNOSIS — K9189 Other postprocedural complications and disorders of digestive system: Secondary | ICD-10-CM

## 2023-10-13 DIAGNOSIS — K3580 Unspecified acute appendicitis: Secondary | ICD-10-CM | POA: Diagnosis not present

## 2023-10-13 DIAGNOSIS — N1831 Chronic kidney disease, stage 3a: Secondary | ICD-10-CM

## 2023-10-13 DIAGNOSIS — K567 Ileus, unspecified: Secondary | ICD-10-CM

## 2023-10-13 LAB — CBC WITH DIFFERENTIAL/PLATELET
Abs Immature Granulocytes: 0.05 10*3/uL (ref 0.00–0.07)
Basophils Absolute: 0 10*3/uL (ref 0.0–0.1)
Basophils Relative: 0 %
Eosinophils Absolute: 0.1 10*3/uL (ref 0.0–0.5)
Eosinophils Relative: 1 %
HCT: 42 % (ref 39.0–52.0)
Hemoglobin: 14 g/dL (ref 13.0–17.0)
Immature Granulocytes: 1 %
Lymphocytes Relative: 11 %
Lymphs Abs: 1.1 10*3/uL (ref 0.7–4.0)
MCH: 27.6 pg (ref 26.0–34.0)
MCHC: 33.3 g/dL (ref 30.0–36.0)
MCV: 82.8 fL (ref 80.0–100.0)
Monocytes Absolute: 0.9 10*3/uL (ref 0.1–1.0)
Monocytes Relative: 9 %
Neutro Abs: 7.9 10*3/uL — ABNORMAL HIGH (ref 1.7–7.7)
Neutrophils Relative %: 78 %
Platelets: 228 10*3/uL (ref 150–400)
RBC: 5.07 MIL/uL (ref 4.22–5.81)
RDW: 14 % (ref 11.5–15.5)
WBC: 10.1 10*3/uL (ref 4.0–10.5)
nRBC: 0 % (ref 0.0–0.2)

## 2023-10-13 LAB — BASIC METABOLIC PANEL
Anion gap: 6 (ref 5–15)
BUN: 20 mg/dL (ref 6–20)
CO2: 30 mmol/L (ref 22–32)
Calcium: 8.5 mg/dL — ABNORMAL LOW (ref 8.9–10.3)
Chloride: 102 mmol/L (ref 98–111)
Creatinine, Ser: 1.36 mg/dL — ABNORMAL HIGH (ref 0.61–1.24)
GFR, Estimated: 60 mL/min — ABNORMAL LOW (ref >60)
Glucose, Bld: 124 mg/dL — ABNORMAL HIGH (ref 70–99)
Potassium: 3.3 mmol/L — ABNORMAL LOW (ref 3.5–5.1)
Sodium: 138 mmol/L (ref 135–145)

## 2023-10-13 LAB — CULTURE, BLOOD (ROUTINE X 2)
Culture: NO GROWTH
Culture: NO GROWTH
Special Requests: ADEQUATE

## 2023-10-13 LAB — PHOSPHORUS: Phosphorus: 2.4 mg/dL — ABNORMAL LOW (ref 2.5–4.6)

## 2023-10-13 LAB — MAGNESIUM: Magnesium: 2.3 mg/dL (ref 1.7–2.4)

## 2023-10-13 MED ORDER — POLYETHYLENE GLYCOL 3350 17 G PO PACK
17.0000 g | PACK | Freq: Every day | ORAL | Status: DC
  Administered 2023-10-13 – 2023-10-15 (×3): 17 g via ORAL
  Filled 2023-10-13 (×3): qty 1

## 2023-10-13 MED ORDER — POTASSIUM CHLORIDE 20 MEQ PO PACK
20.0000 meq | PACK | Freq: Every day | ORAL | Status: DC
  Administered 2023-10-13 – 2023-10-15 (×3): 20 meq via ORAL
  Filled 2023-10-13 (×3): qty 1

## 2023-10-13 NOTE — Progress Notes (Signed)
PROGRESS NOTE  Matthew Wise UJW:[id number] DOB: 07-Nov-1961 DOA: 10/08/2023 PCP: Anabel Halon, MD  Brief History:   62 y.o. male,with past medical history of CVA, with residual left-sided weakness, PE-DVT, on Pradaxa, CKD stage IIIb, hypothyroidism, hypertension, hyperlipidemia, patient presents to ED secondary to weakness, nausea, abdominal pain, subjective fever, he denies cough, congestion, chest pain, or vomiting, no dysuria. -In ED he was noted to be tender in right lower quadrant, CT abdomen pelvis has been obtained which was significant for acute appendicitis, his workup significant For creatinine 1.4, at baseline, low sodium at 3.2, and elevated white blood cell count at 15.4, patient was started on IV antibiotics, ED discussed with general surgery, they will hold on surgery today given he took his Pradaxa this morning, and plan to go to the OR tomorrow, [facility] hospitalist consulted to admit   Assessment/Plan: Acute appendicitis - Treated -Presented with abdominal pain, nausea, fever, CT abdomen pelvis significant for acute appendicitis -completed IV Zosyn per surgery team;  follow-up blood cultures NGTD -postop s/p robotic lap appendectomy 10/09/23    Post op Ileus -Pt developed abd distension, nausea and vomiting postop day 1 -discussed with Dr. Robyne Peers, made NPO, placed NG tube initially -supportive measures given and slowly improved to advance diet  -pt having bowel movements  -advance to full liquid  Dysphagia -speech therapy eval -esophagram   Primary Hypertension>> hypotension (resolved) -restarted home antihypertensive medications    Obesity last 2 Body mass index is 35.94 kg/m   History of CVA with residual left-sided deficits -Continue with aspirin, continue with statin    History of DVT -resumed Pradaxa   Hypokalemia - repleted with IV KCl - Mg is 2.3   Hiccups - thorazine PRN   Hypothyroidism - resumed home levothyroxine   Mixed  hyperlipidemia - Continue home statin    Chronic kidney disease, stage 3a -baseline creatinine 1.3-1.5        Family Communication:   sister at bedside 11/13  Consultants:  general surgery  Code Status:  FULL  DVT Prophylaxis:  pradaxa   Procedures: As Listed in Progress Note Above  Antibiotics: None      Subjective: Pt complains of dysphagia with pills.  Denies f/c, cp, sob, n/v.  Had multiple BMs  Objective: Vitals:   10/12/23 1304 10/12/23 1900 10/13/23 0329 10/13/23 1334  BP: 133/82  (!) 170/67 (!) 133/98  Pulse:   80 93  Resp: 16 10 16 16   Temp:   98 F (36.7 C)   TempSrc:   Oral   SpO2: 95%  97% 99%  Weight:      Height:        Intake/Output Summary (Last 24 hours) at 10/13/2023 1830 Last data filed at 10/13/2023 1700 Gross per 24 hour  Intake 360 ml  Output 1500 ml  Net -1140 ml   Weight change:  Exam:  General:  Pt is alert, follows commands appropriately, not in acute distress HEENT: No icterus, No thrush, No neck mass, [place]/AT Cardiovascular: RRR, S1/S2, no rubs, no gallops Respiratory: bibasilar crackles.  No wheeze Abdomen: Soft/+BS, non tender, non distended, no guarding Extremities: No edema, No lymphangitis, No petechiae, No rashes, no synovitis   Data Reviewed: I have personally reviewed following labs and imaging studies Basic Metabolic Panel: Recent Labs  Lab 10/09/23 0352 10/10/23 0424 10/11/23 0411 10/12/23 0413 10/13/23 0413  NA 137 136 139 143 138  K 3.4* 3.3* 3.3* 4.0 3.3*  CL 105 104 104 103 102  CO2 27 24 26 30 30   GLUCOSE 102* 135* 120* 132* 124*  BUN 17 17 20 19 20   CREATININE 1.35* 1.60* 1.54* 1.46* 1.36*  CALCIUM 8.4* 8.7* 8.8* 9.1 8.5*  MG  --   --  2.2  --  2.3  PHOS  --   --   --   --  2.4*   Liver Function Tests: Recent Labs  Lab 10/08/23 1319  AST 25  ALT 54*  ALKPHOS 105  BILITOT 1.4*  PROT 7.0  ALBUMIN 3.6   No results for input(s): "LIPASE", "AMYLASE" in the last 168 hours. No results  for input(s): "AMMONIA" in the last 168 hours. Coagulation Profile: Recent Labs  Lab 10/08/23 1319  INR 1.4*   CBC: Recent Labs  Lab 10/08/23 1319 10/09/23 0352 10/10/23 0424 10/11/23 0411 10/12/23 0413 10/13/23 0413  WBC 15.4* 16.4* 13.3* 10.7* 12.1* 10.1  NEUTROABS 12.7*  --  11.3*  --  10.3* 7.9*  HGB 14.9 13.3 13.9 14.4 14.4 14.0  HCT 44.4 38.9* 40.9 42.5 43.3 42.0  MCV 81.8 81.6 82.3 81.9 83.0 82.8  PLT 225 200 189 201 234 228   Cardiac Enzymes: No results for input(s): "CKTOTAL", "CKMB", "CKMBINDEX", "TROPONINI" in the last 168 hours. BNP: Invalid input(s): "POCBNP" CBG: No results for input(s): "GLUCAP" in the last 168 hours. HbA1C: No results for input(s): "HGBA1C" in the last 72 hours. Urine analysis:    Component Value Date/Time   COLORURINE YELLOW 10/08/2023 1500   APPEARANCEUR CLEAR 10/08/2023 1500   LABSPEC 1.011 10/08/2023 1500   PHURINE 5.0 10/08/2023 1500   GLUCOSEU NEGATIVE 10/08/2023 1500   HGBUR NEGATIVE 10/08/2023 1500   BILIRUBINUR NEGATIVE 10/08/2023 1500   KETONESUR NEGATIVE 10/08/2023 1500   PROTEINUR NEGATIVE 10/08/2023 1500   UROBILINOGEN >8.0 (H) 02/03/2015 1854   NITRITE NEGATIVE 10/08/2023 1500   LEUKOCYTESUR NEGATIVE 10/08/2023 1500   Sepsis Labs: @LABRCNTIP (procalcitonin:4,lacticidven:4) ) Recent Results (from the past 240 hour(s))  Resp panel by RT-PCR (RSV, Flu A&B, Covid) Anterior Nasal Swab     Status: None   Collection Time: 10/08/23  1:00 PM   Specimen: Anterior Nasal Swab  Result Value Ref Range Status   SARS Coronavirus 2 by RT PCR NEGATIVE NEGATIVE Final    Comment: (NOTE) SARS-CoV-2 target nucleic acids are NOT DETECTED.  The SARS-CoV-2 RNA is generally detectable in upper respiratory specimens during the acute phase of infection. The lowest concentration of SARS-CoV-2 viral copies this assay can detect is 138 copies/mL. A negative result does not preclude SARS-Cov-2 infection and should not be used as the sole  basis for treatment or other patient management decisions. A negative result may occur with  improper specimen collection/handling, submission of specimen other than nasopharyngeal swab, presence of viral mutation(s) within the areas targeted by this assay, and inadequate number of viral copies(<138 copies/mL). A negative result must be combined with clinical observations, patient history, and epidemiological information. The expected result is Negative.  Fact Sheet for Patients:  BloggerCourse.com  Fact Sheet for Healthcare Providers:  SeriousBroker.it  This test is no t yet approved or cleared by the [place] FDA and  has been authorized for detection and/or diagnosis of SARS-CoV-2 by FDA under an Emergency Use Authorization (EUA). This EUA will remain  in effect (meaning this test can be used) for the duration of the COVID-19 declaration under Section 564(b)(1) of the Act, 21 U.S.C.section 360bbb-3(b)(1), unless the authorization is terminated  or revoked sooner.  Influenza A by PCR NEGATIVE NEGATIVE Final   Influenza B by PCR NEGATIVE NEGATIVE Final    Comment: (NOTE) The Xpert Xpress SARS-CoV-2/FLU/RSV plus assay is intended as an aid in the diagnosis of influenza from Nasopharyngeal swab specimens and should not be used as a sole basis for treatment. Nasal washings and aspirates are unacceptable for Xpert Xpress SARS-CoV-2/FLU/RSV testing.  Fact Sheet for Patients: BloggerCourse.com  Fact Sheet for Healthcare Providers: SeriousBroker.it  This test is not yet approved or cleared by the [place] FDA and has been authorized for detection and/or diagnosis of SARS-CoV-2 by FDA under an Emergency Use Authorization (EUA). This EUA will remain in effect (meaning this test can be used) for the duration of the COVID-19 declaration under Section 564(b)(1) of the Act,  21 U.S.C. section 360bbb-3(b)(1), unless the authorization is terminated or revoked.     Resp Syncytial Virus by PCR NEGATIVE NEGATIVE Final    Comment: (NOTE) Fact Sheet for Patients: BloggerCourse.com  Fact Sheet for Healthcare Providers: SeriousBroker.it  This test is not yet approved or cleared by the [place] FDA and has been authorized for detection and/or diagnosis of SARS-CoV-2 by FDA under an Emergency Use Authorization (EUA). This EUA will remain in effect (meaning this test can be used) for the duration of the COVID-19 declaration under Section 564(b)(1) of the Act, 21 U.S.C. section 360bbb-3(b)(1), unless the authorization is terminated or revoked.  Performed at [facility], [address] [place], [place] [address]   Blood Culture (routine x 2)     Status: None   Collection Time: 10/08/23  1:19 PM   Specimen: BLOOD  Result Value Ref Range Status   Specimen Description BLOOD rt forearm  Final   Special Requests   Final    BOTTLES DRAWN AEROBIC AND ANAEROBIC Blood Culture adequate volume   Culture   Final    NO GROWTH 5 DAYS Performed at [facility], [address], [place], [place] [address]    Report Status 10/13/2023 FINAL  Final  Blood Culture (routine x 2)     Status: None   Collection Time: 10/08/23  1:20 PM   Specimen: BLOOD  Result Value Ref Range Status   Specimen Description BLOOD BLOOD RIGHT HAND  Final   Special Requests   Final    BOTTLES DRAWN AEROBIC AND ANAEROBIC Blood Culture results may not be optimal due to an excessive volume of blood received in culture bottles   Culture   Final    NO GROWTH 5 DAYS Performed at [facility], [address], [place], [place] [address]    Report Status 10/13/2023 FINAL  Final  Surgical pcr screen     Status: None   Collection Time: 10/09/23  7:30 AM   Specimen: Nasal Mucosa; Nasal Swab  Result Value Ref Range Status   MRSA, PCR NEGATIVE NEGATIVE  Final   Staphylococcus aureus NEGATIVE NEGATIVE Final    Comment: (NOTE) The Xpert SA Assay (FDA approved for NASAL specimens in patients 49 years of age and older), is one component of a comprehensive surveillance program. It is not intended to diagnose infection nor to guide or monitor treatment. Performed at [facility], [address] [place], [place] [address]      Scheduled Meds:  acetaminophen  1,000 mg Oral Q6H   amLODipine  5 mg Oral Daily   aspirin EC  81 mg Oral Daily   atorvastatin  40 mg Oral Daily   bisacodyl  10 mg Rectal Daily  dabigatran  150 mg Oral Q12H   levETIRAcetam  500 mg Oral BID   levothyroxine  25 mcg Oral Q0600   lisinopril  2.5 mg Oral Daily   pantoprazole  40 mg Oral Daily   polyethylene glycol  17 g Oral Daily   senna-docusate  1 tablet Oral BID   tamsulosin  0.4 mg Oral QPC supper   Continuous Infusions:  Procedures/Studies: DG Abd 1 View  Result Date: 10/12/2023 CLINICAL DATA:  233005 Postoperative ileus (HCC) [id number] EXAM: ABDOMEN - 1 VIEW COMPARISON:  X-ray abdomen 10/10/23, CT abdomen pelvis 10/08/2023 FINDINGS: Multiple loops of small bowel dilated up to 5.8 cm. Stool within the rectum. A no radio-opaque calculi or other significant radiographic abnormality are seen. IMPRESSION: Dilated loops of small bowel suggestive of bowel obstruction versus ileus. Electronically Signed   By: Tish Frederickson M.D.   On: 10/12/2023 18:19   DG Abd 1 View  Result Date: 10/10/2023 CLINICAL DATA:  Follow-up of ileus EXAM: ABDOMEN - 1 VIEW COMPARISON:  CT yesterday FINDINGS: 2 supine views of the abdomen and pelvis demonstrate small bowel dilatation up to 5.1 cm, new/increased since yesterday's CT. Normal caliber colon with large volume stool throughout. Rectal stool ball of 8 cm suggests fecal impaction. No gross free intraperitoneal air. Gas-filled, mildly dilated stomach. IMPRESSION: New/increased small bowel dilatation favoring adynamic ileus versus partial  small bowel obstruction. Colonic stool volume suggests constipation or fecal impaction. Electronically Signed   By: Jeronimo Greaves M.D.   On: 10/10/2023 13:46   DG CHEST PORT 1 VIEW  Result Date: 10/10/2023 CLINICAL DATA:  NG tube placement EXAM: PORTABLE CHEST 1 VIEW COMPARISON:  CXR 10/08/23 FINDINGS: No pleural effusion. No pneumothorax. Hazy opacity at the left lung base could represent atelectasis or infection. Enteric tube courses below diaphragm with the tip projecting over the expected location of the stomach and the side hole in the proximal stomach. Gastric gaseous distention. There also multiple prominent loops small bowel in the upper abdomen. Subdiaphragmatic lucency on the right may be artifactual, but if there is clinical concern for pneumoperitoneum further evaluation with a CT of the abdomen and pelvis should be considered. IMPRESSION: 1. Enteric tube courses below diaphragm with the tip projecting over the expected location of the stomach and the side hole in the proximal stomach. 2. Subdiaphragmatic lucency on the right may be artifactual, but if there is clinical concern for pneumoperitoneum further evaluation with a CT of the abdomen and pelvis should be considered. 3. Hazy opacity at the left lung base could represent atelectasis or infection. These results will be called to the ordering clinician or representative by the Radiologist Assistant, and communication documented in the PACS or [employer]. Electronically Signed   By: Lorenza Cambridge M.D.   On: 10/10/2023 11:20   CT CHEST ABDOMEN PELVIS W CONTRAST  Result Date: 10/08/2023 CLINICAL DATA:  Sepsis EXAM: CT CHEST, ABDOMEN, AND PELVIS WITH CONTRAST TECHNIQUE: Multidetector CT imaging of the chest, abdomen and pelvis was performed following the standard protocol during bolus administration of intravenous contrast. RADIATION DOSE REDUCTION: This exam was performed according to the departmental dose-optimization program which  includes automated exposure control, adjustment of the mA and/or kV according to patient size and/or use of iterative reconstruction technique. CONTRAST:  OMNIPAQUE IOHEXOL 300 MG/ML  SOLN COMPARISON:  CT angio chest 12/03/2020, CT abdomen pelvis 04/10/2016 FINDINGS: CT CHEST FINDINGS Cardiovascular: Normal heart size. No significant pericardial effusion. The thoracic aorta is normal in caliber. Mild atherosclerotic plaque  of the thoracic aorta. No coronary artery calcifications. 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 The main pulmonary artery is normal in caliber. No central pulmonary embolus. Limited evaluation more distally due to timing of contrast. Mediastinum/Nodes: No enlarged mediastinal, hilar, or axillary lymph nodes. Thyroid gland, trachea, and esophagus demonstrate no significant findings. Lungs/Pleura: No focal consolidation. No pulmonary nodule. No pulmonary mass. No pleural effusion. No pneumothorax. Musculoskeletal: Similar-appearing superficial subcutaneus soft tissue 1.7 cm fluid density lesion along the right axilla/chest wall likely representing a sebaceous cyst. No suspicious lytic or blastic osseous lesions. No acute displaced fracture. CT ABDOMEN PELVIS FINDINGS Hepatobiliary: No focal liver abnormality. No gallstones, gallbladder wall thickening, or pericholecystic fluid. No biliary dilatation. Pancreas: No focal lesion. Normal pancreatic contour. No surrounding inflammatory changes. No main pancreatic ductal dilatation. Spleen: Normal in size without focal abnormality. Adrenals/Urinary Tract: No adrenal nodule bilaterally. Bilateral kidneys enhance symmetrically. Fluid density lesions likely represent simple renal cysts. Simple renal cysts, in the absence of clinically indicated signs/symptoms, require no independent follow-up. No hydronephrosis. No hydroureter. The urinary bladder is unremarkable.  Stomach/Bowel: Stomach is within normal limits. No evidence of bowel wall thickening or dilatation. The appendix is enlarged in caliber measuring up to 1.5 cm with associated appendiceal wall thickening and periappendiceal fat stranding. No definite appendicolith. 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 Multiple venous collaterals along the abdominal wall. No abdominal aorta or iliac aneurysm. Moderate atherosclerotic plaque of the aorta and its branches. No abdominal, pelvic, or inguinal lymphadenopathy. Reproductive: Prostate is unremarkable. Other: No intraperitoneal free fluid. No intraperitoneal free gas. No organized fluid collection. Musculoskeletal: Tiny fat containing umbilical hernia. Left gluteal cleft superficial subcutaneus soft tissue 2.7 cm fluid density lesion likely representing a sebaceous cyst. No suspicious lytic or blastic osseous lesions. No acute displaced fracture. Degenerative changes of the bilateral hips. IMPRESSION: 1. Acute non-perforated appendicitis (1.5 cm). No definite appendicoliths. 2. 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 3. No acute intrathoracic abnormality. Electronically Signed   By: Tish Frederickson M.D.   On: 10/08/2023 18:13   DG Chest Port 1 View  Result Date: 10/08/2023 CLINICAL DATA:  Fever.  Weakness. EXAM: PORTABLE CHEST 1 VIEW COMPARISON:  Chest radiograph dated Apr 24, 2022. FINDINGS: The heart size and mediastinal contours are within normal limits. Both lungs are clear. No pneumothorax or pleural effusion. No acute osseous abnormality. IMPRESSION: No acute cardiopulmonary findings. Electronically Signed   By: Hart Robinsons M.D.   On: 10/08/2023 16:24    Catarina Hartshorn, DO  [facility]  If 7PM-7AM, please contact night-coverage [email or web address] Password [email or web address] 10/13/2023, 6:30 PM   LOS: 5 days

## 2023-10-13 NOTE — Progress Notes (Addendum)
[facility] Progress Note  4 Days Post-Op  Subjective: Patient seen and examined.  He is resting comfortably in bed.  He is complaining of hiccups this morning.  He denied any episodes of vomiting after I saw him yesterday, but did have an episode of spitting up after some clear liquids this morning.  He underwent the enema yesterday, and has had multiple bowel movements.  He also confirms passing flatus.  He has not gotten up out of bed much since surgery.  Objective: Vital signs in last 24 hours: Temp:  [98 F (36.7 C)] 98 F (36.7 C) (11/13 0329) Pulse Rate:  [80] 80 (11/13 0329) Resp:  [10-16] 16 (11/13 0329) BP: (133-170)/(67-82) 170/67 (11/13 0329) SpO2:  [95 %-97 %] 97 % (11/13 0329) Last BM Date : 10/12/23  Intake/Output from previous day: 11/12 0701 - 11/13 0700 In: 284.1 [P.O.:240; IV Piggyback:44.1] Out: 2000 [Urine:2000] Intake/Output this shift: No intake/output data recorded.  General appearance: alert, cooperative, and no distress GI: Abdomen soft, mild distention, no percussion tenderness, nontender to palpation; no rigidity, guarding, rebound tenderness; incisions C/D/I with Dermabond in place  Lab Results:  Recent Labs    10/12/23 0413 10/13/23 0413  WBC 12.1* 10.1  HGB 14.4 14.0  HCT 43.3 42.0  PLT 234 228   BMET Recent Labs    10/12/23 0413 10/13/23 0413  NA 143 138  K 4.0 3.3*  CL 103 102  CO2 30 30  GLUCOSE 132* 124*  BUN 19 20  CREATININE 1.46* 1.36*  CALCIUM 9.1 8.5*   PT/INR No results for input(s): "LABPROT", "INR" in the last 72 hours.  Studies/Results: DG Abd 1 View  Result Date: 10/12/2023 CLINICAL DATA:  233005 Postoperative ileus (HCC) [id number] EXAM: ABDOMEN - 1 VIEW COMPARISON:  X-ray abdomen 10/10/23, CT abdomen pelvis 10/08/2023 FINDINGS: Multiple loops of small bowel dilated up to 5.8 cm. Stool within the rectum. A no radio-opaque calculi or other significant radiographic abnormality are seen. IMPRESSION:  Dilated loops of small bowel suggestive of bowel obstruction versus ileus. Electronically Signed   By: Tish Frederickson M.D.   On: 10/12/2023 18:19    Anti-infectives: Anti-infectives (From admission, onward)    Start     Dose/Rate Route Frequency Ordered Stop   10/09/23 1600  vancomycin (VANCOCIN) IVPB 1000 mg/200 mL premix  Status:  Discontinued       Placed in "And" Linked Group   1,000 mg 200 mL/hr over 60 Minutes Intravenous Every 24 hours 10/08/23 1459 10/08/23 2049   10/09/23 1500  vancomycin (VANCOCIN) IVPB 1000 mg/200 mL premix  Status:  Discontinued       Placed in "And" Linked Group   1,000 mg 200 mL/hr over 60 Minutes Intravenous Every 24 hours 10/08/23 1459 10/08/23 2049   10/08/23 2000  piperacillin-tazobactam (ZOSYN) IVPB 3.375 g  Status:  Discontinued        3.375 g 12.5 mL/hr over 240 Minutes Intravenous Every 8 hours 10/08/23 1937 10/11/23 1051   10/08/23 1500  vancomycin (VANCOCIN) IVPB 1000 mg/200 mL premix        1,000 mg 200 mL/hr over 60 Minutes Intravenous Every 1 hr x 2 10/08/23 1428 10/08/23 1755   10/08/23 1415  ceFEPIme (MAXIPIME) 2 g in sodium chloride 0.9 % 100 mL IVPB        2 g 200 mL/hr over 30 Minutes Intravenous  Once 10/08/23 1407 10/08/23 1558       Assessment/Plan:  Patient is a 62 year old male who was admitted  with acute appendicitis.  He is status post robotic assisted laparoscopic appendectomy on 11/9.   -Patient without leukocytosis today, 10.1 -KUB yesterday was demonstrating persistently dilated loops of small bowel, likely consistent with a postoperative ileus -Will maintain patient on clear liquids and see how he progresses today, possible full liquids for dinner tonight -MiraLAX added to bowel regimen -Explained to patient that he needs to try to get up and move around, as the more he is laying down, this lower his ileus will resolve.   -PT ordered -IV fluids per primary team -Okay to restart Pradaxa from surgical  standpoint -Appreciate hospitalist recommendations   LOS: 5 days    Adileny Delon A Cha Gomillion 10/13/2023

## 2023-10-13 NOTE — TOC Initial Note (Signed)
Transition of Care [facility]) - Initial/Assessment Note    Patient Details  Name: Matthew Wise MRN: [id number] Date of Birth: 10-Feb-1961  Transition [facility]) CM/SW Contact:    Villa Herb, LCSWA Phone Number: 10/13/2023, 1:28 PM  Clinical Narrative:                 CSW updated that PT is recommending SNF for pt at D/C. CSW spoke with pt who states that he is agreeable to SNF referral being sent out locally. Pt prefers placement at [facility] if possible. CSW to complete referral and send out. TOC to follow.   Expected Discharge Plan: Skilled Nursing Facility Barriers to Discharge: Continued Medical Work up   Patient Goals and CMS Choice Patient states their goals for this hospitalization and ongoing recovery are:: go to SNF CMS Medicare.gov Compare Post Acute Care list provided to:: Patient Choice offered to / list presented to : Patient [place] ownership interest in [facility].provided to:: Patient    Expected Discharge Plan and Services In-house Referral: Clinical Social Work Discharge Planning Services: CM Consult Post Acute Care Choice: Skilled Nursing Facility Living arrangements for the past 2 months: Single Family Home                                      Prior Living Arrangements/Services Living arrangements for the past 2 months: Single Family Home Lives with:: Self Patient language and need for interpreter reviewed:: Yes Do you feel safe going back to the place where you live?: Yes      Need for Family Participation in Patient Care: Yes (Comment) Care giver support system in place?: Yes (comment)   Criminal Activity/Legal Involvement Pertinent to Current Situation/Hospitalization: No - Comment as needed  Activities of Daily Living   ADL Screening (condition at time of admission) Independently performs ADLs?: No Does the patient have a NEW difficulty with bathing/dressing/toileting/self-feeding that is expected to last >3 days?: Yes  (Initiates electronic notice to provider for possible OT consult) Does the patient have a NEW difficulty with getting in/out of bed, walking, or climbing stairs that is expected to last >3 days?: Yes (Initiates electronic notice to provider for possible PT consult) Does the patient have a NEW difficulty with communication that is expected to last >3 days?: No Is the patient deaf or have difficulty hearing?: No Does the patient have difficulty seeing, even when wearing glasses/contacts?: No Does the patient have difficulty concentrating, remembering, or making decisions?: No  Permission Sought/Granted                  Emotional Assessment Appearance:: Appears stated age Attitude/Demeanor/Rapport: Engaged Affect (typically observed): Accepting Orientation: : Oriented to Self, Oriented to Place, Oriented to  Time, Oriented to Situation Alcohol / Substance Use: Not Applicable Psych Involvement: No (comment)  Admission diagnosis:  Acute pancreatitis [K85.90] Acute appendicitis [K35.80] Acute appendicitis, unspecified acute appendicitis type [K35.80] Patient Active Problem List   Diagnosis Date Noted   Acute pancreatitis 10/08/2023   Acute appendicitis 10/08/2023   Skin abscess 10/01/2023   Acute non-recurrent sinusitis 01/15/2023   Gastroesophageal reflux disease 01/15/2023   Seizure with onset of stroke (HCC) 01/15/2023   Encounter for general adult medical examination with abnormal findings 07/06/2022   Stage 3a chronic kidney disease (HCC) 07/06/2022   Hospital discharge follow-up 04/30/2022   Obesity, Class I, BMI 30-34.9 04/25/2022   Generalized weakness  Near syncope    Non-seasonal allergic rhinitis 03/25/2022   Encounter for examination following treatment at hospital 02/27/2022   BPH (benign prostatic hyperplasia) 09/24/2021   History of CVA with residual deficit 09/24/2021   Dysphagia, post-stroke    Spastic hemiplegia affecting nondominant side (HCC)     Hemiparesis affecting left side as late effect of stroke (HCC)    Thyroid nodule 01/25/2021   Hypothyroidism 01/25/2021   H/O adenomatous polyp of colon 12/30/2020   Essential hypertension 11/28/2020   Mixed hyperlipidemia 11/28/2020   H/O medication noncompliance 09/07/2020   Nicotine dependence 12/05/2019   Prediabetes 10/24/2019   Tubular adenoma of colon 09/14/2017   Long term (current) use of anticoagulants 11/14/2014   Sickle cell trait (HCC) 08/29/2012   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   PCP:  Anabel Halon, MD Pharmacy:   [facility] [address], [place] - [address] [place] [address] Phone: [phone] Fax: [phone]     Social Determinants of Health (SDOH) Social History: SDOH Screenings   Food Insecurity: No Food Insecurity (10/08/2023)  Housing: Low Risk  (10/08/2023)  Transportation Needs: No Transportation Needs (10/08/2023)  Utilities: Not At Risk (10/08/2023)  Alcohol Screen: Low Risk  (10/01/2021)  Depression (PHQ2-9): Low Risk  (10/01/2023)  Financial Resource Strain: Low Risk  (10/15/2022)  Physical Activity: Insufficiently Active (10/01/2021)  Social Connections: Moderately Isolated (10/15/2022)  Stress: No Stress Concern Present (10/15/2022)  Tobacco Use: Medium Risk (10/09/2023)   SDOH Interventions:     Readmission Risk Interventions     No data to display

## 2023-10-13 NOTE — Plan of Care (Signed)
  Problem: Health Behavior/Discharge Planning: Goal: Ability to manage health-related needs will improve Outcome: Not Met (add Reason)   Problem: Clinical Measurements: Goal: Ability to maintain clinical measurements within normal limits will improve Outcome: Not Met (add Reason) Goal: Will remain free from infection Outcome: Progressing   Problem: Nutrition: Goal: Adequate nutrition will be maintained Outcome: Not Met (add Reason)   Problem: Elimination: Goal: Will not experience complications related to bowel motility Outcome: Not Met (add Reason)

## 2023-10-13 NOTE — Plan of Care (Signed)
  Problem: Acute Rehab PT Goals(only PT should resolve) Goal: Pt Will Go Supine/Side To Sit Outcome: Progressing Flowsheets (Taken 10/13/2023 1427) Pt will go Supine/Side to Sit:  with modified independence  with supervision Goal: Patient Will Transfer Sit To/From Stand Outcome: Progressing Flowsheets (Taken 10/13/2023 1427) Patient will transfer sit to/from stand:  with minimal assist  with contact guard assist Goal: Pt Will Transfer Bed To Chair/Chair To Bed Outcome: Progressing Flowsheets (Taken 10/13/2023 1427) Pt will Transfer Bed to Chair/Chair to Bed:  with min assist  with contact guard assist Goal: Pt Will Ambulate Outcome: Progressing Flowsheets (Taken 10/13/2023 1427) Pt will Ambulate:  50 feet  with contact guard assist  with minimal assist Note: Using Quad-cane   2:28 PM, 10/13/23 Ocie Bob, MPT Physical Therapist with [facility] [phone] office [phone] mobile phone

## 2023-10-13 NOTE — NC FL2 (Signed)
[place] MEDICAID FL2 LEVEL OF CARE FORM     IDENTIFICATION  Patient Name: Matthew Wise Birthdate: [birth year] Sex: male Admission Date (Current Location): 10/08/2023  [facility] and [place] Number:  [employer] and Address:  [facility],  [address] [place] [address]      Provider Number: [id number]  Attending Physician Name and Address:  Catarina Hartshorn, MD  Relative Name and Phone Number:       Current Level of Care: Hospital Recommended Level of Care: Skilled Nursing Facility Prior Approval Number:    Date Approved/Denied:   PASRR Number: [id number]  Discharge Plan: SNF    Current Diagnoses: Patient Active Problem List   Diagnosis Date Noted   Acute pancreatitis 10/08/2023   Acute appendicitis 10/08/2023   Skin abscess 10/01/2023   Acute non-recurrent sinusitis 01/15/2023   Gastroesophageal reflux disease 01/15/2023   Seizure with onset of stroke (HCC) 01/15/2023   Encounter for general adult medical examination with abnormal findings 07/06/2022   Stage 3a chronic kidney disease (HCC) 07/06/2022   Hospital discharge follow-up 04/30/2022   Obesity, Class I, BMI 30-34.9 04/25/2022   Generalized weakness    Near syncope    Non-seasonal allergic rhinitis 03/25/2022   Encounter for examination following treatment at hospital 02/27/2022   BPH (benign prostatic hyperplasia) 09/24/2021   History of CVA with residual deficit 09/24/2021   Dysphagia, post-stroke    Spastic hemiplegia affecting nondominant side (HCC)    Hemiparesis affecting left side as late effect of stroke (HCC)    Thyroid nodule 01/25/2021   Hypothyroidism 01/25/2021   H/O adenomatous polyp of colon 12/30/2020   Essential hypertension 11/28/2020   Mixed hyperlipidemia 11/28/2020   H/O medication noncompliance 09/07/2020   Nicotine dependence 12/05/2019   Prediabetes 10/24/2019   Tubular adenoma of colon 09/14/2017   Long term (current) use of anticoagulants 11/14/2014    Sickle cell trait (HCC) 08/29/2012   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    Orientation RESPIRATION BLADDER Height & Weight     Self, Time, Situation, Place  Normal Continent Weight: 265 lb (120.2 kg) Height:  6' (182.9 cm)  BEHAVIORAL SYMPTOMS/MOOD NEUROLOGICAL BOWEL NUTRITION STATUS      Continent Diet (See D/C summary)  AMBULATORY STATUS COMMUNICATION OF NEEDS Skin   Extensive Assist Verbally Normal                       Personal Care Assistance Level of Assistance  Bathing, Feeding, Dressing Bathing Assistance: Limited assistance Feeding assistance: Independent Dressing Assistance: Limited assistance     Functional Limitations Info  Sight, Hearing, Speech Sight Info: Adequate Hearing Info: Adequate Speech Info: Adequate    SPECIAL CARE FACTORS FREQUENCY  PT (By licensed PT), OT (By licensed OT)     PT Frequency: 5 times weekly OT Frequency: 5 times weekly            Contractures Contractures Info: Not present    Additional Factors Info  Code Status, Allergies Code Status Info: FULL Allergies Info: NKA           Current Medications (10/13/2023):  This is the current hospital active medication list Current Facility-Administered Medications  Medication Dose Route Frequency Provider Last Rate Last Admin   acetaminophen (TYLENOL) tablet 1,000 mg  1,000 mg Oral Q6H Pappayliou, Catherine A, DO   1,000 mg at 10/12/23 0807   albuterol (PROVENTIL) (2.5 MG/3ML) 0.083% nebulizer solution 2.5 mg  2.5 mg Nebulization Q2H PRN Pappayliou,  Catherine A, DO       amLODipine (NORVASC) tablet 5 mg  5 mg Oral Daily Johnson, Clanford L, MD   5 mg at 10/13/23 0801   aspirin EC tablet 81 mg  81 mg Oral Daily Pappayliou, Catherine A, DO   81 mg at 10/13/23 0801   atorvastatin (LIPITOR) tablet 40 mg  40 mg Oral Daily Pappayliou, Catherine A, DO   40 mg at 10/13/23 0801   bisacodyl (DULCOLAX) suppository 10 mg  10 mg Rectal Daily Pappayliou, Catherine A, DO    10 mg at 10/13/23 1116   chlorproMAZINE (THORAZINE) tablet 25 mg  25 mg Oral TID PRN Laural Benes, Clanford L, MD       dabigatran (PRADAXA) capsule 150 mg  150 mg Oral Q12H Johnson, Clanford L, MD   150 mg at 10/13/23 0801   fentaNYL (SUBLIMAZE) injection 25 mcg  25 mcg Intravenous Q2H PRN Pappayliou, Catherine A, DO       hydrALAZINE (APRESOLINE) injection 10 mg  10 mg Intravenous Q4H PRN Johnson, Clanford L, MD       levETIRAcetam (KEPPRA) tablet 500 mg  500 mg Oral BID Laural Benes, Clanford L, MD   500 mg at 10/13/23 0800   levothyroxine (SYNTHROID) tablet 25 mcg  25 mcg Oral Q0600 Pappayliou, Catherine A, DO   25 mcg at 10/13/23 0513   lisinopril (ZESTRIL) tablet 2.5 mg  2.5 mg Oral Daily Johnson, Clanford L, MD   2.5 mg at 10/13/23 0801   oxyCODONE (Oxy IR/ROXICODONE) immediate release tablet 5 mg  5 mg Oral Q4H PRN Pappayliou, Catherine A, DO       pantoprazole (PROTONIX) EC tablet 40 mg  40 mg Oral Daily Pappayliou, Catherine A, DO   40 mg at 10/13/23 0801   phenol (CHLORASEPTIC) mouth spray 1 spray  1 spray Mouth/Throat PRN Pappayliou, Catherine A, DO       polyethylene glycol (MIRALAX / GLYCOLAX) packet 17 g  17 g Oral Daily Pappayliou, Catherine A, DO   17 g at 10/13/23 1033   prochlorperazine (COMPAZINE) injection 10 mg  10 mg Intravenous Q4H PRN Pappayliou, Catherine A, DO   10 mg at 10/13/23 1159   senna-docusate (Senokot-S) tablet 1 tablet  1 tablet Oral BID Pappayliou, Catherine A, DO   1 tablet at 10/13/23 0800   tamsulosin (FLOMAX) capsule 0.4 mg  0.4 mg Oral QPC supper Pappayliou, Catherine A, DO   0.4 mg at 10/12/23 1800     Discharge Medications: Please see discharge summary for a list of discharge medications.  Relevant Imaging Results:  Relevant Lab Results:   Additional Information SSN: [id number] [place]

## 2023-10-13 NOTE — Plan of Care (Signed)
Progressing, no episodes of vomiting during night shift.

## 2023-10-13 NOTE — Evaluation (Signed)
Physical Therapy Evaluation Patient Details Name: Matthew Wise MRN: [id number] DOB: [birth year] Today's Date: 10/13/2023  History of Present Illness  Matthew Wise  is a 62 y.o. male,with past medical history of CVA, with residual left-sided weakness, PE-DVT, on Pradaxa, CKD stage IIIb, hypothyroidism, hypertension, hyperlipidemia, patient presents to ED secondary to weakness, nausea, abdominal pain, subjective fever, he denies cough, congestion, chest pain, or vomiting, no dysuria.  -In ED he was noted to be tender in right lower quadrant, CT abdomen pelvis has been obtained which was significant for acute appendicitis, his workup significant For creatinine 1.4, at baseline, low sodium at 3.2, and elevated white blood cell count at 15.4, patient was started on IV antibiotics, ED discussed with general surgery, they will hold on surgery today given he took his Pradaxa this morning, and plan to go to the OR tomorrow, [facility] hospitalist consulted to admit   Clinical Impression  Patient has to use RLE for lifting LLE during sit to supine, supine to sitting with labored  movement and requires repeated attempts before able to complete sit to stands due to weakness.  Patient followed with w/c for safety during gait training and limited mostly due to fatigue, had to take sitting rest break in w/c before walking back to bedside and tolerated sitting on BSC to attempt a bowel movement after therapy  - nursing staff notified.  Patient will benefit from continued skilled physical therapy in hospital and recommended venue below to increase strength, balance, endurance for safe ADLs and gait.       If plan is discharge home, recommend the following: A lot of help with walking and/or transfers;A lot of help with bathing/dressing/bathroom;Help with stairs or ramp for entrance;Assistance with cooking/housework   Can travel by private vehicle   Yes    Equipment Recommendations None recommended by PT   Recommendations for Other Services       Functional Status Assessment Patient has had a recent decline in their functional status and demonstrates the ability to make significant improvements in function in a reasonable and predictable amount of time.     Precautions / Restrictions Precautions Precautions: Fall Restrictions Weight Bearing Restrictions: No      Mobility  Bed Mobility Overal bed mobility: Needs Assistance Bed Mobility: Supine to Sit, Sit to Supine     Supine to sit: Supervision Sit to supine: Supervision   General bed mobility comments: has to hook LLE using RLE during supine to sitting with increased time, labored movement    Transfers Overall transfer level: Needs assistance Equipment used: Quad cane Transfers: Sit to/from Stand, Bed to chair/wheelchair/BSC Sit to Stand: Min assist   Step pivot transfers: Min assist       General transfer comment: increased time, labored movement    Ambulation/Gait Ambulation/Gait assistance: Min assist, Mod assist Gait Distance (Feet): 20 Feet Assistive device: Quad cane Gait Pattern/deviations: Decreased step length - right, Decreased step length - left, Decreased stride length, Decreased stance time - left, Ataxic, Antalgic, Steppage Gait velocity: decreased     General Gait Details: slow unsteady labored cadence limited mostly due to c/o fatigue, followed with w/c for safety  Stairs            Wheelchair Mobility     Tilt Bed    Modified Rankin (Stroke Patients Only)       Balance Overall balance assessment: Needs assistance Sitting-balance support: Feet supported, No upper extremity supported Sitting balance-Leahy Scale: Fair Sitting balance - Comments: fair/good seated at  EOB   Standing balance support: During functional activity, Single extremity supported Standing balance-Leahy Scale: Fair Standing balance comment: using quad-cane                             Pertinent  Vitals/Pain Pain Assessment Pain Assessment: No/denies pain    Home Living Family/patient expects to be discharged to:: Private residence Living Arrangements: Spouse/significant other Available Help at Discharge: Family;Available PRN/intermittently Type of Home: House Home Access: Stairs to enter Entrance Stairs-Rails: Right;Left;Can reach both Entrance Stairs-Number of Steps: 3   Home Layout: One level Home Equipment: Agricultural consultant (2 wheels);Cane - quad;Shower seat      Prior Function Prior Level of Function : Needs assist       Physical Assist : Mobility (physical);ADLs (physical) Mobility (physical): Bed mobility;Transfers;Gait;Stairs   Mobility Comments: Household ambulation using quad-cane, Mod Independent transfers, bed mobility ADLs Comments: Assisted by family     Extremity/Trunk Assessment   Upper Extremity Assessment Upper Extremity Assessment: Generalized weakness;Right hand dominant;RUE deficits/detail;LUE deficits/detail RUE Deficits / Details: grossly -4/5 RUE Sensation: WNL RUE Coordination: WNL LUE Deficits / Details: grossly 1-2/5 mostly trace movement, baseline since CVA LUE: Unable to fully assess due to immobilization LUE Sensation: decreased light touch;decreased proprioception LUE Coordination: decreased fine motor;decreased gross motor    Lower Extremity Assessment Lower Extremity Assessment: RLE deficits/detail;LLE deficits/detail RLE Deficits / Details: grossly -4/5 RLE Sensation: WNL RLE Coordination: WNL LLE Deficits / Details: grossly 3+/5 except ankle dorsiflexion 0-1/5 LLE: Unable to fully assess due to immobilization LLE Sensation: decreased light touch;decreased proprioception LLE Coordination: decreased fine motor;decreased gross motor    Cervical / Trunk Assessment Cervical / Trunk Assessment: Normal  Communication   Communication Communication: No apparent difficulties Cueing Techniques: Verbal cues;Tactile cues  Cognition  Arousal: Alert Behavior During Therapy: WFL for tasks assessed/performed Overall Cognitive Status: Within Functional Limits for tasks assessed                                          General Comments      Exercises     Assessment/Plan    PT Assessment Patient needs continued PT services  PT Problem List Decreased strength;Decreased activity tolerance;Decreased balance;Decreased mobility       PT Treatment Interventions DME instruction;Gait training;Functional mobility training;Therapeutic activities;Therapeutic exercise;Balance training;Patient/family education    PT Goals (Current goals can be found in the Care Plan section)  Acute Rehab PT Goals Patient Stated Goal: return home with family to assist PT Goal Formulation: With patient Potential to Achieve Goals: Good    Frequency Min 3X/week     Co-evaluation               AM-PAC PT "6 Clicks" Mobility  Outcome Measure Help needed turning from your back to your side while in a flat bed without using bedrails?: A Little Help needed moving from lying on your back to sitting on the side of a flat bed without using bedrails?: A Little Help needed moving to and from a bed to a chair (including a wheelchair)?: A Little Help needed standing up from a chair using your arms (e.g., wheelchair or bedside chair)?: A Little Help needed to walk in hospital room?: A Lot Help needed climbing 3-5 steps with a railing? : A Lot 6 Click Score: 16    End of Session Equipment Utilized  During Treatment: Gait belt Activity Tolerance: Patient tolerated treatment well;Patient limited by fatigue Patient left: with call bell/phone within reach;Other (comment) (left seated on [facility]) Nurse Communication: Mobility status PT Visit Diagnosis: Unsteadiness on feet (R26.81);Other abnormalities of gait and mobility (R26.89);Muscle weakness (generalized) (M62.81);Hemiplegia and hemiparesis Hemiplegia - Right/Left: Left Hemiplegia -  dominant/non-dominant: Dominant Hemiplegia - caused by: Cerebral infarction    Time: 5409-8119 PT Time Calculation (min) (ACUTE ONLY): 37 min   Charges:   PT Evaluation $PT Eval Moderate Complexity: 1 Mod PT Treatments $Therapeutic Activity: 23-37 mins PT General Charges $$ ACUTE PT VISIT: 1 Visit         2:26 PM, 10/13/23 Ocie Bob, MPT Physical Therapist with [facility] 336 [phone] office [phone] mobile phone

## 2023-10-14 ENCOUNTER — Encounter (HOSPITAL_COMMUNITY): Payer: Self-pay | Admitting: Surgery

## 2023-10-14 DIAGNOSIS — R066 Hiccough: Secondary | ICD-10-CM

## 2023-10-14 DIAGNOSIS — R1310 Dysphagia, unspecified: Secondary | ICD-10-CM

## 2023-10-14 DIAGNOSIS — K567 Ileus, unspecified: Secondary | ICD-10-CM | POA: Diagnosis not present

## 2023-10-14 DIAGNOSIS — K9189 Other postprocedural complications and disorders of digestive system: Secondary | ICD-10-CM | POA: Diagnosis not present

## 2023-10-14 DIAGNOSIS — I69354 Hemiplegia and hemiparesis following cerebral infarction affecting left non-dominant side: Secondary | ICD-10-CM | POA: Diagnosis not present

## 2023-10-14 DIAGNOSIS — K3580 Unspecified acute appendicitis: Secondary | ICD-10-CM | POA: Diagnosis not present

## 2023-10-14 LAB — BASIC METABOLIC PANEL
Anion gap: 10 (ref 5–15)
BUN: 18 mg/dL (ref 6–20)
CO2: 29 mmol/L (ref 22–32)
Calcium: 8.4 mg/dL — ABNORMAL LOW (ref 8.9–10.3)
Chloride: 98 mmol/L (ref 98–111)
Creatinine, Ser: 1.21 mg/dL (ref 0.61–1.24)
GFR, Estimated: >60 mL/min (ref >60)
Glucose, Bld: 115 mg/dL — ABNORMAL HIGH (ref 70–99)
Potassium: 3 mmol/L — ABNORMAL LOW (ref 3.5–5.1)
Sodium: 137 mmol/L (ref 135–145)

## 2023-10-14 LAB — CBC
HCT: 38.4 % — ABNORMAL LOW (ref 39.0–52.0)
Hemoglobin: 12.9 g/dL — ABNORMAL LOW (ref 13.0–17.0)
MCH: 27.7 pg (ref 26.0–34.0)
MCHC: 33.6 g/dL (ref 30.0–36.0)
MCV: 82.6 fL (ref 80.0–100.0)
Platelets: 215 10*3/uL (ref 150–400)
RBC: 4.65 MIL/uL (ref 4.22–5.81)
RDW: 13.8 % (ref 11.5–15.5)
WBC: 10.6 10*3/uL — ABNORMAL HIGH (ref 4.0–10.5)
nRBC: 0 % (ref 0.0–0.2)

## 2023-10-14 LAB — PHOSPHORUS: Phosphorus: 2.9 mg/dL (ref 2.5–4.6)

## 2023-10-14 LAB — MAGNESIUM: Magnesium: 2.1 mg/dL (ref 1.7–2.4)

## 2023-10-14 MED ORDER — POTASSIUM CHLORIDE 10 MEQ/100ML IV SOLN
10.0000 meq | INTRAVENOUS | Status: AC
Start: 2023-10-14 — End: 2023-10-14
  Administered 2023-10-14 (×3): 10 meq via INTRAVENOUS
  Filled 2023-10-14 (×3): qty 100

## 2023-10-14 MED ORDER — POTASSIUM CHLORIDE 10 MEQ/100ML IV SOLN: 10.0000 meq | INTRAVENOUS | Status: DC

## 2023-10-14 NOTE — Progress Notes (Signed)
Physical Therapy Treatment Patient Details Name: Matthew Wise MRN: [id number] DOB: 04/13/1961 Today's Date: 10/14/2023   History of Present Illness Matthew Wise  is a 62 y.o. male,with past medical history of CVA, with residual left-sided weakness, PE-DVT, on Pradaxa, CKD stage IIIb, hypothyroidism, hypertension, hyperlipidemia, patient presents to ED secondary to weakness, nausea, abdominal pain, subjective fever, he denies cough, congestion, chest pain, or vomiting, no dysuria.  -In ED he was noted to be tender in right lower quadrant, CT abdomen pelvis has been obtained which was significant for acute appendicitis, his workup significant For creatinine 1.4, at baseline, low sodium at 3.2, and elevated white blood cell count at 15.4, patient was started on IV antibiotics, ED discussed with general surgery, they will hold on surgery today given he took his Pradaxa this morning, and plan to go to the OR tomorrow, [facility] consulted to admit    PT Comments  Pt supine and willing to participate with therapy.  Presents with mod I bed mobility and transfer training, increased time and cueing for scooting and Lt foot placement to assist with standing safely.  Sidestep complete front of bed for safety and ambulated with QC to chair.  Seated exercises complete for strengthening.  EOS pt left in chair with call bell within reach.    If plan is discharge home, recommend the following:     Can travel by private vehicle        Equipment Recommendations       Recommendations for Other Services       Precautions / Restrictions Precautions Precautions: Fall Restrictions Weight Bearing Restrictions: No     Mobility  Bed Mobility Overal bed mobility: Modified Independent Bed Mobility: Supine to Sit     Supine to sit: Supervision     General bed mobility comments: increased time, need for handrail assistance, labored movement    Transfers Overall transfer level: Needs  assistance Equipment used: Quad cane Transfers: Sit to/from Stand Sit to Stand: Min assist           General transfer comment: increased time, labored movement; cueing for Lt foot and Rt hand positioning prior standing    Ambulation/Gait Ambulation/Gait assistance: Min assist, Mod assist Gait Distance (Feet): 20 Feet Assistive device: Quad cane Gait Pattern/deviations: Decreased step length - right, Decreased step length - left, Decreased stride length, Decreased stance time - left, Ataxic, Antalgic, Steppage Gait velocity: decreased     General Gait Details: sidestep front of bed for safety x 3 RT, ambulated from bed to chair with min/mod A   Stairs             Wheelchair Mobility     Tilt Bed    Modified Rankin (Stroke Patients Only)       Balance                                            Cognition Arousal: Alert Behavior During Therapy: WFL for tasks assessed/performed Overall Cognitive Status: Within Functional Limits for tasks assessed                                          Exercises General Exercises - Lower Extremity Ankle Circles/Pumps: AROM, Seated Long Arc Quad: AROM, Both, 5 reps, Seated  General Comments        Pertinent Vitals/Pain Pain Assessment Pain Assessment: No/denies pain    Home Living                          Prior Function            PT Goals (current goals can now be found in the care plan section)      Frequency           PT Plan      Co-evaluation              AM-PAC PT "6 Clicks" Mobility   Outcome Measure  Help needed turning from your back to your side while in a flat bed without using bedrails?: A Little Help needed moving from lying on your back to sitting on the side of a flat bed without using bedrails?: A Little Help needed moving to and from a bed to a chair (including a wheelchair)?: A Little Help needed standing up from a chair using  your arms (e.g., wheelchair or bedside chair)?: A Little Help needed to walk in hospital room?: A Lot Help needed climbing 3-5 steps with a railing? : A Lot 6 Click Score: 16    End of Session Equipment Utilized During Treatment: Gait belt Activity Tolerance: Patient tolerated treatment well;Patient limited by fatigue Patient left: with call bell/phone within reach Nurse Communication: Mobility status PT Visit Diagnosis: Unsteadiness on feet (R26.81);Other abnormalities of gait and mobility (R26.89);Muscle weakness (generalized) (M62.81);Hemiplegia and hemiparesis     Time: 0800-0838 PT Time Calculation (min) (ACUTE ONLY): 38 min  Charges:    $Therapeutic Activity: 38-52 mins PT General Charges $$ ACUTE PT VISIT: 1 Visit                     Becky Sax, LPTA/CLT; CBIS [phone]  Juel Burrow 10/14/2023, 9:14 AM

## 2023-10-14 NOTE — Plan of Care (Signed)
Pt experienced two bowel movements during nursing shift. Both bowel movements were slightly mushy and brown in color. No nausea was expelled, pt given Thorazine for hiccups. Hiccups subsided, but has returned. Pt did not request any Thorazine, informed pt it is available prn q6 hrs if he needs relief. Will monitor pt for the duration of nursing shift.

## 2023-10-14 NOTE — Progress Notes (Signed)
PROGRESS NOTE  DARRALL MATA NWG:[id number] DOB: 09-28-1961 DOA: 10/08/2023 PCP: Anabel Halon, MD  Brief History:   62 y.o. male,with past medical history of CVA, with residual left-sided weakness, PE-DVT, on Pradaxa, CKD stage IIIb, hypothyroidism, hypertension, hyperlipidemia, patient presents to ED secondary to weakness, nausea, abdominal pain, subjective fever, he denies cough, congestion, chest pain, or vomiting, no dysuria. -In ED he was noted to be tender in right lower quadrant, CT abdomen pelvis has been obtained which was significant for acute appendicitis, his workup significant For creatinine 1.4, at baseline, low sodium at 3.2, and elevated white blood cell count at 15.4, patient was started on IV antibiotics, ED discussed with general surgery, they will hold on surgery today given he took his Pradaxa the morning of admission, and plan to go to the OR 11/9. He underwent robotic lap appendectomy 10/09/23.  He developed a post-op ileus and required NG decompression.  This improved and his NG was removed and his diet was slowly advanced.  He was evaluated by speech therapy for his dysphagia. They felt that patient did not have any functional or anatomic limitations to impeded his swallowing.  There may be some cognitive issues affecting his swallowing.  Esophagram was ordered.    Assessment/Plan: Acute appendicitis - Treated -Presented with abdominal pain, nausea, fever, CT abdomen pelvis significant for acute appendicitis -completed IV Zosyn per surgery team;  follow-up blood cultures NGTD -postop s/p robotic lap appendectomy 10/09/23    Post op Ileus -Pt developed abd distension, nausea and vomiting postop day 1 -discussed with Dr. Robyne Peers, made NPO, placed NG tube initially -supportive measures given and slowly improved to advance diet  -pt having bowel movements  -advance to full liquid>>dys 3 diet   Dysphagia -speech therapy eval>>ok to advance diet from  dysphagia standpoint -esophagram planned on 11/15   Primary Hypertension>> hypotension (resolved) -held amlodipine and lisinopril initially   Obesity last 2 Body mass index is 35.94 kg/m   History of CVA with residual left-sided deficits -Continue with aspirin, continue with statin    History of DVT -resumed Pradaxa   Hypokalemia - repleted with IV KCl - Mg is 2.3   Hiccups - thorazine PRN   Hypothyroidism - resumed home levothyroxine   Mixed hyperlipidemia - Continue home statin    Chronic kidney disease, stage 3a -baseline creatinine 1.3-1.5               Family Communication:   sister at bedside 11/14   Consultants:  general surgery   Code Status:  FULL   DVT Prophylaxis:  pradaxa     Procedures: As Listed in Progress Note Above   Antibiotics: None          Subjective: Had one BM today.  Denies f/c, cp, sob, n/v, abd pain  Objective: Vitals:   10/13/23 1334 10/13/23 1945 10/14/23 0519 10/14/23 1440  BP: (!) 133/98 125/77 107/65 (!) 98/58  Pulse: 93 67 80 73  Resp: 16 16 17    Temp:  98.2 F (36.8 C) 98.3 F (36.8 C) 98.9 F (37.2 C)  TempSrc:    Oral  SpO2: 99% 100% 97% 100%  Weight:      Height:        Intake/Output Summary (Last 24 hours) at 10/14/2023 1731 Last data filed at 10/14/2023 1330 Gross per 24 hour  Intake 480 ml  Output 1400 ml  Net -920 ml   Weight change:  Exam:  General:  Pt is alert, follows commands appropriately, not in acute distress HEENT: No icterus, No thrush, No neck mass, [place]/AT Cardiovascular: RRR, S1/S2, no rubs, no gallops Respiratory: bibasilar crackles.  No wheeze Abdomen: Soft/+BS, non tender, non distended, no guarding Extremities: No edema, No lymphangitis, No petechiae, No rashes, no synovitis   Data Reviewed: I have personally reviewed following labs and imaging studies Basic Metabolic Panel: Recent Labs  Lab 10/10/23 0424 10/11/23 0411 10/12/23 0413 10/13/23 0413  10/14/23 0422  NA 136 139 143 138 137  K 3.3* 3.3* 4.0 3.3* 3.0*  CL 104 104 103 102 98  CO2 24 26 30 30 29   GLUCOSE 135* 120* 132* 124* 115*  BUN 17 20 19 20 18   CREATININE 1.60* 1.54* 1.46* 1.36* 1.21  CALCIUM 8.7* 8.8* 9.1 8.5* 8.4*  MG  --  2.2  --  2.3 2.1  PHOS  --   --   --  2.4* 2.9   Liver Function Tests: Recent Labs  Lab 10/08/23 1319  AST 25  ALT 54*  ALKPHOS 105  BILITOT 1.4*  PROT 7.0  ALBUMIN 3.6   No results for input(s): "LIPASE", "AMYLASE" in the last 168 hours. No results for input(s): "AMMONIA" in the last 168 hours. Coagulation Profile: Recent Labs  Lab 10/08/23 1319  INR 1.4*   CBC: Recent Labs  Lab 10/08/23 1319 10/09/23 0352 10/10/23 0424 10/11/23 0411 10/12/23 0413 10/13/23 0413 10/14/23 0422  WBC 15.4*   < > 13.3* 10.7* 12.1* 10.1 10.6*  NEUTROABS 12.7*  --  11.3*  --  10.3* 7.9*  --   HGB 14.9   < > 13.9 14.4 14.4 14.0 12.9*  HCT 44.4   < > 40.9 42.5 43.3 42.0 38.4*  MCV 81.8   < > 82.3 81.9 83.0 82.8 82.6  PLT 225   < > 189 201 234 228 215   < > = values in this interval not displayed.   Cardiac Enzymes: No results for input(s): "CKTOTAL", "CKMB", "CKMBINDEX", "TROPONINI" in the last 168 hours. BNP: Invalid input(s): "POCBNP" CBG: No results for input(s): "GLUCAP" in the last 168 hours. HbA1C: No results for input(s): "HGBA1C" in the last 72 hours. Urine analysis:    Component Value Date/Time   COLORURINE YELLOW 10/08/2023 1500   APPEARANCEUR CLEAR 10/08/2023 1500   LABSPEC 1.011 10/08/2023 1500   PHURINE 5.0 10/08/2023 1500   GLUCOSEU NEGATIVE 10/08/2023 1500   HGBUR NEGATIVE 10/08/2023 1500   BILIRUBINUR NEGATIVE 10/08/2023 1500   KETONESUR NEGATIVE 10/08/2023 1500   PROTEINUR NEGATIVE 10/08/2023 1500   UROBILINOGEN >8.0 (H) 02/03/2015 1854   NITRITE NEGATIVE 10/08/2023 1500   LEUKOCYTESUR NEGATIVE 10/08/2023 1500   Sepsis Labs: @LABRCNTIP (procalcitonin:4,lacticidven:4) ) Recent Results (from the past 240  hour(s))  Resp panel by RT-PCR (RSV, Flu A&B, Covid) Anterior Nasal Swab     Status: None   Collection Time: 10/08/23  1:00 PM   Specimen: Anterior Nasal Swab  Result Value Ref Range Status   SARS Coronavirus 2 by RT PCR NEGATIVE NEGATIVE Final    Comment: (NOTE) SARS-CoV-2 target nucleic acids are NOT DETECTED.  The SARS-CoV-2 RNA is generally detectable in upper respiratory specimens during the acute phase of infection. The lowest concentration of SARS-CoV-2 viral copies this assay can detect is 138 copies/mL. A negative result does not preclude SARS-Cov-2 infection and should not be used as the sole basis for treatment or other patient management decisions. A negative result may occur with  improper specimen collection/handling, submission of  specimen other than nasopharyngeal swab, presence of viral mutation(s) within the areas targeted by this assay, and inadequate number of viral copies(<138 copies/mL). A negative result must be combined with clinical observations, patient history, and epidemiological information. The expected result is Negative.  Fact Sheet for Patients:  BloggerCourse.com  Fact Sheet for Healthcare Providers:  SeriousBroker.it  This test is no t yet approved or cleared by the [place] FDA and  has been authorized for detection and/or diagnosis of SARS-CoV-2 by FDA under an Emergency Use Authorization (EUA). This EUA will remain  in effect (meaning this test can be used) for the duration of the COVID-19 declaration under Section 564(b)(1) of the Act, 21 U.S.C.section 360bbb-3(b)(1), unless the authorization is terminated  or revoked sooner.       Influenza A by PCR NEGATIVE NEGATIVE Final   Influenza B by PCR NEGATIVE NEGATIVE Final    Comment: (NOTE) The Xpert Xpress SARS-CoV-2/FLU/RSV plus assay is intended as an aid in the diagnosis of influenza from Nasopharyngeal swab specimens and should not  be used as a sole basis for treatment. Nasal washings and aspirates are unacceptable for Xpert Xpress SARS-CoV-2/FLU/RSV testing.  Fact Sheet for Patients: BloggerCourse.com  Fact Sheet for Healthcare Providers: SeriousBroker.it  This test is not yet approved or cleared by the [place] FDA and has been authorized for detection and/or diagnosis of SARS-CoV-2 by FDA under an Emergency Use Authorization (EUA). This EUA will remain in effect (meaning this test can be used) for the duration of the COVID-19 declaration under Section 564(b)(1) of the Act, 21 U.S.C. section 360bbb-3(b)(1), unless the authorization is terminated or revoked.     Resp Syncytial Virus by PCR NEGATIVE NEGATIVE Final    Comment: (NOTE) Fact Sheet for Patients: BloggerCourse.com  Fact Sheet for Healthcare Providers: SeriousBroker.it  This test is not yet approved or cleared by the [place] FDA and has been authorized for detection and/or diagnosis of SARS-CoV-2 by FDA under an Emergency Use Authorization (EUA). This EUA will remain in effect (meaning this test can be used) for the duration of the COVID-19 declaration under Section 564(b)(1) of the Act, 21 U.S.C. section 360bbb-3(b)(1), unless the authorization is terminated or revoked.  Performed at [facility], [address], [place], [place] [address]   Blood Culture (routine x 2)     Status: None   Collection Time: 10/08/23  1:19 PM   Specimen: BLOOD  Result Value Ref Range Status   Specimen Description BLOOD rt forearm  Final   Special Requests   Final    BOTTLES DRAWN AEROBIC AND ANAEROBIC Blood Culture adequate volume   Culture   Final    NO GROWTH 5 DAYS Performed at [facility], [address], [place], [place] [address]    Report Status 10/13/2023 FINAL  Final  Blood Culture (routine x 2)     Status: None   Collection Time: 10/08/23   1:20 PM   Specimen: BLOOD  Result Value Ref Range Status   Specimen Description BLOOD BLOOD RIGHT HAND  Final   Special Requests   Final    BOTTLES DRAWN AEROBIC AND ANAEROBIC Blood Culture results may not be optimal due to an excessive volume of blood received in culture bottles   Culture   Final    NO GROWTH 5 DAYS Performed at [facility], [address] [place], [place] [address]    Report Status 10/13/2023 FINAL  Final  Surgical pcr screen     Status: None  Collection Time: 10/09/23  7:30 AM   Specimen: Nasal Mucosa; Nasal Swab  Result Value Ref Range Status   MRSA, PCR NEGATIVE NEGATIVE Final   Staphylococcus aureus NEGATIVE NEGATIVE Final    Comment: (NOTE) The Xpert SA Assay (FDA approved for NASAL specimens in patients 69 years of age and older), is one component of a comprehensive surveillance program. It is not intended to diagnose infection nor to guide or monitor treatment. Performed at [facility], [address] [place], [place] [address]      Scheduled Meds:  acetaminophen  1,000 mg Oral Q6H   amLODipine  5 mg Oral Daily   aspirin EC  81 mg Oral Daily   atorvastatin  40 mg Oral Daily   bisacodyl  10 mg Rectal Daily   dabigatran  150 mg Oral Q12H   levETIRAcetam  500 mg Oral BID   levothyroxine  25 mcg Oral Q0600   lisinopril  2.5 mg Oral Daily   pantoprazole  40 mg Oral Daily   polyethylene glycol  17 g Oral Daily   potassium chloride  20 mEq Oral Daily   senna-docusate  1 tablet Oral BID   tamsulosin  0.4 mg Oral QPC supper   Continuous Infusions:  Procedures/Studies: DG Abd 1 View  Result Date: 10/12/2023 CLINICAL DATA:  [id number] Postoperative ileus (HCC) [id number] EXAM: ABDOMEN - 1 VIEW COMPARISON:  X-ray abdomen 10/10/23, CT abdomen pelvis 10/08/2023 FINDINGS: Multiple loops of small bowel dilated up to 5.8 cm. Stool within the rectum. A no radio-opaque calculi or other significant radiographic abnormality are seen. IMPRESSION: Dilated loops of small  bowel suggestive of bowel obstruction versus ileus. Electronically Signed   By: Tish Frederickson M.D.   On: 10/12/2023 18:19   DG Abd 1 View  Result Date: 10/10/2023 CLINICAL DATA:  Follow-up of ileus EXAM: ABDOMEN - 1 VIEW COMPARISON:  CT yesterday FINDINGS: 2 supine views of the abdomen and pelvis demonstrate small bowel dilatation up to 5.1 cm, new/increased since yesterday's CT. Normal caliber colon with large volume stool throughout. Rectal stool ball of 8 cm suggests fecal impaction. No gross free intraperitoneal air. Gas-filled, mildly dilated stomach. IMPRESSION: New/increased small bowel dilatation favoring adynamic ileus versus partial small bowel obstruction. Colonic stool volume suggests constipation or fecal impaction. Electronically Signed   By: Jeronimo Greaves M.D.   On: 10/10/2023 13:46   DG CHEST PORT 1 VIEW  Result Date: 10/10/2023 CLINICAL DATA:  NG tube placement EXAM: PORTABLE CHEST 1 VIEW COMPARISON:  CXR 10/08/23 FINDINGS: No pleural effusion. No pneumothorax. Hazy opacity at the left lung base could represent atelectasis or infection. Enteric tube courses below diaphragm with the tip projecting over the expected location of the stomach and the side hole in the proximal stomach. Gastric gaseous distention. There also multiple prominent loops small bowel in the upper abdomen. Subdiaphragmatic lucency on the right may be artifactual, but if there is clinical concern for pneumoperitoneum further evaluation with a CT of the abdomen and pelvis should be considered. IMPRESSION: 1. Enteric tube courses below diaphragm with the tip projecting over the expected location of the stomach and the side hole in the proximal stomach. 2. Subdiaphragmatic lucency on the right may be artifactual, but if there is clinical concern for pneumoperitoneum further evaluation with a CT of the abdomen and pelvis should be considered. 3. Hazy opacity at the left lung base could represent atelectasis or infection.  These results will be called to the ordering clinician or representative by the Radiologist  Geophysicist/field seismologist, and communication documented in the PACS or [employer]. Electronically Signed   By: Lorenza Cambridge M.D.   On: 10/10/2023 11:20   CT CHEST ABDOMEN PELVIS W CONTRAST  Result Date: 10/08/2023 CLINICAL DATA:  Sepsis EXAM: CT CHEST, ABDOMEN, AND PELVIS WITH CONTRAST TECHNIQUE: Multidetector CT imaging of the chest, abdomen and pelvis was performed following the standard protocol during bolus administration of intravenous contrast. RADIATION DOSE REDUCTION: This exam was performed according to the departmental dose-optimization program which includes automated exposure control, adjustment of the mA and/or kV according to patient size and/or use of iterative reconstruction technique. CONTRAST:  OMNIPAQUE IOHEXOL 300 MG/ML  SOLN COMPARISON:  CT angio chest 12/03/2020, CT abdomen pelvis 04/10/2016 FINDINGS: CT CHEST FINDINGS Cardiovascular: Normal heart size. No significant pericardial effusion. The thoracic aorta is normal in caliber. Mild atherosclerotic plaque of the thoracic aorta. No coronary artery calcifications. 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 The main pulmonary artery is normal in caliber. No central pulmonary embolus. Limited evaluation more distally due to timing of contrast. Mediastinum/Nodes: No enlarged mediastinal, hilar, or axillary lymph nodes. Thyroid gland, trachea, and esophagus demonstrate no significant findings. Lungs/Pleura: No focal consolidation. No pulmonary nodule. No pulmonary mass. No pleural effusion. No pneumothorax. Musculoskeletal: Similar-appearing superficial subcutaneus soft tissue 1.7 cm fluid density lesion along the right axilla/chest wall likely representing a sebaceous cyst. No suspicious lytic or blastic osseous lesions. No acute displaced fracture. CT ABDOMEN PELVIS FINDINGS  Hepatobiliary: No focal liver abnormality. No gallstones, gallbladder wall thickening, or pericholecystic fluid. No biliary dilatation. Pancreas: No focal lesion. Normal pancreatic contour. No surrounding inflammatory changes. No main pancreatic ductal dilatation. Spleen: Normal in size without focal abnormality. Adrenals/Urinary Tract: No adrenal nodule bilaterally. Bilateral kidneys enhance symmetrically. Fluid density lesions likely represent simple renal cysts. Simple renal cysts, in the absence of clinically indicated signs/symptoms, require no independent follow-up. No hydronephrosis. No hydroureter. The urinary bladder is unremarkable. Stomach/Bowel: Stomach is within normal limits. No evidence of bowel wall thickening or dilatation. The appendix is enlarged in caliber measuring up to 1.5 cm with associated appendiceal wall thickening and periappendiceal fat stranding. No definite appendicolith. 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 Multiple venous collaterals along the abdominal wall. No abdominal aorta or iliac aneurysm. Moderate atherosclerotic plaque of the aorta and its branches. No abdominal, pelvic, or inguinal lymphadenopathy. Reproductive: Prostate is unremarkable. Other: No intraperitoneal free fluid. No intraperitoneal free gas. No organized fluid collection. Musculoskeletal: Tiny fat containing umbilical hernia. Left gluteal cleft superficial subcutaneus soft tissue 2.7 cm fluid density lesion likely representing a sebaceous cyst. No suspicious lytic or blastic osseous lesions. No acute displaced fracture. Degenerative changes of the bilateral hips. IMPRESSION: 1. Acute non-perforated appendicitis (1.5 cm). No definite appendicoliths. 2. 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 3. No acute intrathoracic abnormality. Electronically Signed   By: Tish Frederickson M.D.   On: 10/08/2023 18:13   DG Chest Port 1 View  Result Date: 10/08/2023 CLINICAL DATA:  Fever.  Weakness. EXAM: PORTABLE CHEST 1 VIEW COMPARISON:  Chest radiograph dated Apr 24, 2022. FINDINGS: The heart size and mediastinal contours are within normal limits. Both lungs are clear. No pneumothorax or pleural effusion. No acute osseous abnormality. IMPRESSION: No acute cardiopulmonary findings. Electronically Signed   By: Hart Robinsons M.D.   On: 10/08/2023  16:24    Catarina Hartshorn, DO  [facility]  If 7PM-7AM, please contact night-coverage [email or web address] Password [email or web address] 10/14/2023, 5:31 PM   LOS: 6 days

## 2023-10-14 NOTE — Progress Notes (Signed)
[facility] Progress Note  5 Days Post-Op  Subjective: Patient seen and examined.  He is sitting comfortably in chair beside her bed.  He denies any nausea or vomiting last night.  He was able to tolerate clears last night.  He confirms he is still passing flatus and has had numerous bowel movements.  He is supposed to be working with PT today.  Objective: Vital signs in last 24 hours: Temp:  [98.2 F (36.8 C)-98.3 F (36.8 C)] 98.3 F (36.8 C) (11/14 0519) Pulse Rate:  [67-93] 80 (11/14 0519) Resp:  [16-17] 17 (11/14 0519) BP: (107-133)/(65-98) 107/65 (11/14 0519) SpO2:  [97 %-100 %] 97 % (11/14 0519) Last BM Date : 10/13/23  Intake/Output from previous day: 11/13 0701 - 11/14 0700 In: 360 [P.O.:360] Out: 1400 [Urine:900; Emesis/NG output:500] Intake/Output this shift: No intake/output data recorded.  General appearance: alert, cooperative, and no distress GI: Abdomen soft, mild distention, no percussion tenderness, mild right-sided tenderness to palpation; no rigidity, guarding, rebound tenderness; incisions C/D/I with Dermabond in place  Lab Results:  Recent Labs    10/13/23 0413 10/14/23 0422  WBC 10.1 10.6*  HGB 14.0 12.9*  HCT 42.0 38.4*  PLT 228 215   BMET Recent Labs    10/13/23 0413 10/14/23 0422  NA 138 137  K 3.3* 3.0*  CL 102 98  CO2 30 29  GLUCOSE 124* 115*  BUN 20 18  CREATININE 1.36* 1.21  CALCIUM 8.5* 8.4*   PT/INR No results for input(s): "LABPROT", "INR" in the last 72 hours.  Studies/Results: DG Abd 1 View  Result Date: 10/12/2023 CLINICAL DATA:  [id number] Postoperative ileus (HCC) [id number] EXAM: ABDOMEN - 1 VIEW COMPARISON:  X-ray abdomen 10/10/23, CT abdomen pelvis 10/08/2023 FINDINGS: Multiple loops of small bowel dilated up to 5.8 cm. Stool within the rectum. A no radio-opaque calculi or other significant radiographic abnormality are seen. IMPRESSION: Dilated loops of small bowel suggestive of bowel obstruction versus  ileus. Electronically Signed   By: Tish Frederickson M.D.   On: 10/12/2023 18:19    Anti-infectives: Anti-infectives (From admission, onward)    Start     Dose/Rate Route Frequency Ordered Stop   10/09/23 1600  vancomycin (VANCOCIN) IVPB 1000 mg/200 mL premix  Status:  Discontinued       Placed in "And" Linked Group   1,000 mg 200 mL/hr over 60 Minutes Intravenous Every 24 hours 10/08/23 1459 10/08/23 2049   10/09/23 1500  vancomycin (VANCOCIN) IVPB 1000 mg/200 mL premix  Status:  Discontinued       Placed in "And" Linked Group   1,000 mg 200 mL/hr over 60 Minutes Intravenous Every 24 hours 10/08/23 1459 10/08/23 2049   10/08/23 2000  piperacillin-tazobactam (ZOSYN) IVPB 3.375 g  Status:  Discontinued        3.375 g 12.5 mL/hr over 240 Minutes Intravenous Every 8 hours 10/08/23 1937 10/11/23 1051   10/08/23 1500  vancomycin (VANCOCIN) IVPB 1000 mg/200 mL premix        1,000 mg 200 mL/hr over 60 Minutes Intravenous Every 1 hr x 2 10/08/23 1428 10/08/23 1755   10/08/23 1415  ceFEPIme (MAXIPIME) 2 g in sodium chloride 0.9 % 100 mL IVPB        2 g 200 mL/hr over 30 Minutes Intravenous  Once 10/08/23 1407 10/08/23 1558       Assessment/Plan:  Patient is a 62 year old male who was admitted with acute appendicitis.  He is status post robotic assisted laparoscopic appendectomy on 11/9.   -  Patient's WBC C count basically stable, 10.6 from 10.1 -Will advance to full liquids to see how the patient tolerates this -Continue bowel regimen -Explained to patient that he needs to try to get up and move around, as the more he is laying down, this longer his ileus will take to resolve.   -PT working with patient -IV fluids per primary team -Okay to continue Pradaxa -Appreciate hospitalist recommendations   LOS: 6 days    Amerigo Mcglory A Ege Muckey 10/14/2023

## 2023-10-14 NOTE — Evaluation (Signed)
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 two simple tubular adenomas removed. patient was advised to go to [facility] for colon[facility] but he did not keep appt.   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  Noted to have right lower quadrant tenderness on exam, CT significant for acute appendicitis.  Status post robotic assisted laparoscopic appendectomy on November 9.  Clinical course has been complicated by postop ileus.   Pill dysphagia: onset this admission, noted after NG tube removed. No problems swallowing liquids or solids. BSE requested and GI was consulted and esophagram pending.    Assessment / Plan / Recommendation  Clinical Impression  Clinical swallow evaluation completed at bedside. Pt reports no prior h/o dysphagia even after his stroke in 2022. He had NG placed for suctioning due to ileus on Monday/Tuesday and then felt as if a potassium pill became "stuck" at the back of his throat yesterday. BPE was ordered, but cannot be completed until tomorrow due to no radiologist at [facility] today. Pt assessed with thin water, puree, and regular textures and exhibits no signs or symptoms of aspiration and no reports of globus. SLP returned to room later with RN to observe Pt with Tylenol pill and water. Pt took the pill and drank water after it and said it felt like it wouldn't go down. The pill was on the anterior third of his tongue and he required verbal cues and encouragement to move the pill around with his tongue to swallow. He eventually swallowed it after some bites  of banana and sips of water. Unsure why Pt does not appear to feel where the tongue is in his mouth. Discussed with MD, RN, Pt, and family and will initiate D3 and thin and can check back with Pt tomorrow.    SLP Visit Diagnosis: Dysphagia, unspecified (R13.10)    Aspiration Risk   No limitations    Diet Recommendation Regular;Thin liquid (once cleared from surgery)    Liquid Administration via: Cup;Straw Medication Administration: Crushed with puree (if Pt requests until BPE) Supervision: Patient able to self feed Postural Changes: Seated upright at 90 degrees;Remain upright for at least 30 minutes after po intake    Other  Recommendations Oral Care Recommendations: Oral care BID    Recommendations for follow up therapy are one component of a multi-disciplinary discharge planning process, led by the attending physician.  Recommendations may be updated based on patient status, additional functional criteria and insurance authorization.  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  Noted to have right lower quadrant tenderness on exam, CT significant for acute appendicitis.  Status post robotic assisted laparoscopic appendectomy on November 9.  Clinical course has been complicated by postop ileus.   Pill dysphagia: onset this admission, noted after NG tube removed. No problems swallowing liquids or solids. BSE requested and GI was consulted and esophagram pending. Type of Study: Bedside Swallow Evaluation Previous Swallow Assessment: BSE in 2022 ok for regular/thin Diet Prior to this Study: Full liquid diet Temperature Spikes Noted: No Respiratory Status: Room air History of Recent Intubation: Yes Total duration of intubation  (days): 1 days Date extubated: 10/09/23 Behavior/Cognition: Alert;Cooperative;Pleasant mood Oral Cavity Assessment: Within Functional Limits Oral Care Completed by SLP: Yes Oral Cavity - Dentition: Adequate natural dentition Vision: Functional for self-feeding Self-Feeding Abilities: Able to feed self Patient Positioning: Upright in bed Baseline Vocal Quality: Normal Volitional Cough: Strong Volitional Swallow: Able to elicit    Oral/Motor/Sensory Function Overall Oral Motor/Sensory Function: Within functional limits   Ice Chips Ice chips: Within functional limits Presentation: Spoon   Thin Liquid Thin Liquid: Within functional limits Presentation: Cup;Straw;Self Fed    Nectar Thick Nectar Thick Liquid: Not tested   Honey Thick Honey Thick Liquid: Not tested   Puree Puree: Within functional limits Presentation: Spoon   Solid     Solid: Within functional limits Presentation: Self Fed     Thank you,  Havery Moros, CCC-SLP [phone]  Lucciana Head 10/14/2023,3:39 PM

## 2023-10-14 NOTE — Consult Note (Signed)
Gastroenterology Consult   Referring Provider: No ref. provider found Primary Care Physician:  Anabel Halon, MD Primary Gastroenterologist:  Hennie Duos. Marletta Lor, DO   Patient ID: Matthew Wise; [id number]; September 14, 1961   Admit date: 10/08/2023  LOS: 6 days   Date of Consultation: 10/14/2023  Reason for Consultation:  new dysphagia    History of Present Illness   Matthew Wise is a 62 y.o. 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  Noted to have right lower quadrant tenderness on exam, CT significant for acute appendicitis.  Status post robotic assisted laparoscopic appendectomy on November 9.  Clinical course has been complicated by postop ileus.  GI consulted for new dysphagia.  Speech therapy evaluation and esophagram pending.  Patient reports new onset dysphagia with pills. No problems with solids/liquids. Noted after NG tube removed. No odynophagia. No heartburn. He has the hiccups since his surgery. He is passing gas and stool. Appetite slowly improving although states he is not eating much yet. Currently on full liquids. Abdominal pain improved.   Colonoscopy 2016, incomplete -Left colon extremely redundant -2 colon polyps removed, tubular adenoma -Moderate-sized internal hemorrhoids   Prior to Admission medications   Medication Sig Start Date End Date Taking? Authorizing Provider  amLODipine (NORVASC) 5 MG tablet Take 1 tablet (5 mg total) by mouth daily. 01/15/23  Yes Anabel Halon, MD  aspirin EC 81 MG EC tablet Take 1 tablet (81 mg total) by mouth daily. Swallow whole. 02/13/21  Yes Bailey-Modzik, Delila A, NP  atorvastatin (LIPITOR) 40 MG tablet Take 1 tablet by mouth once daily 08/31/23  Yes Patel, Earlie Lou, MD  levETIRAcetam (KEPPRA) 500 MG tablet Take 1 tablet (500 mg  total) by mouth 2 (two) times daily. 08/31/23  Yes McCue, Shanda Bumps, NP  levothyroxine (SYNTHROID) 25 MCG tablet Take 1 tablet by mouth once daily 09/06/23  Yes Anabel Halon, MD  lisinopril (ZESTRIL) 2.5 MG tablet Take 1 tablet by mouth once daily 09/14/23  Yes Anabel Halon, MD  Multiple Vitamin (MULTIVITAMIN) tablet Take 1 tablet by mouth daily.   Yes [provider]  pantoprazole (PROTONIX) 40 MG tablet Take 1 tablet (40 mg total) by mouth daily. 01/15/23  Yes Anabel Halon, MD  PRADAXA 150 MG CAPS capsule Take 1 capsule (150 mg total) by mouth every 12 (twelve) hours. 01/15/23  Yes Patel, Earlie Lou, MD  senna-docusate (SENOKOT-S) 8.6-50 MG tablet Take 1 tablet by mouth at bedtime. 02/12/21  Yes Bailey-Modzik, Delila A, NP  tamsulosin (FLOMAX) 0.4 MG CAPS capsule TAKE 1 CAPSULE BY MOUTH ONCE DAILY AFTER SUPPER 01/15/23  Yes Anabel Halon, MD    Current Facility-Administered Medications  Medication Dose Route Frequency Provider Last Rate Last Admin   acetaminophen (TYLENOL) tablet 1,000 mg  1,000 mg Oral Q6H Pappayliou, Catherine A, DO   1,000 mg at 10/12/23 0807   albuterol (PROVENTIL) (2.5 MG/3ML) 0.083% nebulizer solution 2.5 mg  2.5 mg Nebulization Q2H PRN Pappayliou, Catherine A, DO       amLODipine (NORVASC) tablet 5 mg  5 mg Oral Daily Johnson, Clanford L, MD   5 mg at 10/13/23 0801   aspirin EC tablet 81 mg  81 mg Oral Daily Pappayliou, Catherine A, DO   81 mg at 10/13/23 0801   atorvastatin (LIPITOR) tablet 40 mg  40 mg Oral  Daily Pappayliou, Catherine A, DO   40 mg at 10/13/23 0801   bisacodyl (DULCOLAX) suppository 10 mg  10 mg Rectal Daily Pappayliou, Catherine A, DO   10 mg at 10/13/23 1116   chlorproMAZINE (THORAZINE) tablet 25 mg  25 mg Oral TID PRN Laural Benes, Clanford L, MD   25 mg at 10/13/23 2107   dabigatran (PRADAXA) capsule 150 mg  150 mg Oral Q12H Johnson, Clanford L, MD   150 mg at 10/13/23 2226   fentaNYL (SUBLIMAZE) injection 25 mcg  25 mcg Intravenous Q2H PRN  Pappayliou, Catherine A, DO       hydrALAZINE (APRESOLINE) injection 10 mg  10 mg Intravenous Q4H PRN Johnson, Clanford L, MD       levETIRAcetam (KEPPRA) tablet 500 mg  500 mg Oral BID Laural Benes, Clanford L, MD   500 mg at 10/13/23 2226   levothyroxine (SYNTHROID) tablet 25 mcg  25 mcg Oral Q0600 Pappayliou, Catherine A, DO   25 mcg at 10/14/23 0546   lisinopril (ZESTRIL) tablet 2.5 mg  2.5 mg Oral Daily Johnson, Clanford L, MD   2.5 mg at 10/13/23 0801   oxyCODONE (Oxy IR/ROXICODONE) immediate release tablet 5 mg  5 mg Oral Q4H PRN Pappayliou, Catherine A, DO       pantoprazole (PROTONIX) EC tablet 40 mg  40 mg Oral Daily Pappayliou, Catherine A, DO   40 mg at 10/13/23 0801   phenol (CHLORASEPTIC) mouth spray 1 spray  1 spray Mouth/Throat PRN Pappayliou, Catherine A, DO       polyethylene glycol (MIRALAX / GLYCOLAX) packet 17 g  17 g Oral Daily Pappayliou, Catherine A, DO   17 g at 10/13/23 1033   potassium chloride (KLOR-CON) packet 20 mEq  20 mEq Oral Daily Tat, David, MD   20 mEq at 10/13/23 1942   prochlorperazine (COMPAZINE) injection 10 mg  10 mg Intravenous Q4H PRN Pappayliou, Catherine A, DO   10 mg at 10/13/23 1711   senna-docusate (Senokot-S) tablet 1 tablet  1 tablet Oral BID Pappayliou, Catherine A, DO   1 tablet at 10/13/23 2225   tamsulosin (FLOMAX) capsule 0.4 mg  0.4 mg Oral QPC supper Pappayliou, Catherine A, DO   0.4 mg at 10/12/23 1800    Allergies as of 10/08/2023   (No Known Allergies)    Past Medical History:  Diagnosis Date   Acute CVA (cerebrovascular accident) (HCC) 09/09/2020   Chronic anticoagulation 11/14/2014   CKD (chronic kidney disease) stage 3, GFR 30-59 ml/min (HCC) 10/24/2019   CKD (chronic kidney disease) stage 3, GFR 30-59 ml/min (HCC) 10/24/2019   Clotting disorder (HCC)    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 two simple tubular adenomas removed. patient was advised to go to [facility] for  colonoscopy but he did not keep appt.   IR CT HEAD LTD  02/02/2021   IR PERCUTANEOUS ART THROMBECTOMY/INFUSION INTRACRANIAL INC DIAG ANGIO  02/02/2021   RADIOLOGY WITH ANESTHESIA N/A 02/02/2021   Procedure: IR WITH ANESTHESIA;  Surgeon: Julieanne Cotton, MD;  Location: [facility] OR;  Service: Radiology;  Laterality: N/A;   TEE WITHOUT CARDIOVERSION N/A 09/10/2020   Procedure: TRANSESOPHAGEAL ECHOCARDIOGRAM (TEE) WITH PROPOFOL;  Surgeon: Jonelle Sidle, MD;  Location: [facility];  Service: Cardiovascular;  Laterality: N/A;    Family History  Problem Relation Age of Onset   COPD Father    Stroke Brother    Pulmonary embolism Sister    Throat cancer Brother    Arthritis Mother    Diabetes Mother    Stroke Mother    Hypertension Brother    Diabetes Brother    Arthritis Brother    Diabetes Sister    Colon cancer Neg Hx     Social History   Socioeconomic History   Marital status: Divorced    Spouse name: Not on file   Number of children: 2   Years of education: 12   Highest education level: Not on file  Occupational History   Occupation: disability    Comment: DVT  Tobacco Use   Smoking status: Former    Current packs/day: 1.00    Average packs/day: 1 pack/day for 39.9 years (39.9 ttl pk-yrs)    Types:  Cigarettes    Start date: 12/01/1983   Smokeless tobacco: Never  Vaping Use   Vaping status: Never Used  Substance and Sexual Activity   Alcohol use: Not Currently    Alcohol/week: 3.0 standard drinks of alcohol    Types: 3 Cans of beer per week    Comment: has not drank sice 11/27/20   Drug use: Not Currently    Comment: REMOTE HISTORY, Quit in 2007   Sexual activity: Yes  Other Topics Concern   Not on file  Social History Narrative   Lives with sister   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   Right Handed    Drinks caffeine on occassion   Social Determinants of Health   Financial Resource Strain: Low Risk  (10/15/2022)   Overall Financial Resource Strain (CARDIA)    Difficulty of Paying Living Expenses: Not hard at all  Food Insecurity: No Food Insecurity (10/08/2023)   Hunger Vital Sign    Worried About Running Out of Food in the Last Year: Never true    Ran Out of Food in the Last Year: Never true  Transportation Needs: No Transportation Needs (10/08/2023)   PRAPARE - Administrator, Civil Service (Medical): No    Lack of Transportation (Non-Medical): No  Physical Activity: Insufficiently Active (10/01/2021)   Exercise Vital Sign    Days of Exercise per Week: 5 days    Minutes of Exercise per Session: 20 min  Stress: No Stress Concern Present (10/15/2022)   [employer] of Occupational Health - Occupational Stress Questionnaire    Feeling of Stress : Not at all  Social Connections: Moderately Isolated (10/15/2022)   Social Connection and Isolation Panel [NHANES]    Frequency of Communication with Friends and Family: More than three times a week    Frequency of Social Gatherings with Friends and Family: Three times a week    Attends Religious Services: More than 4 times per year    Active Member of Clubs or Organizations: No    Attends Banker  Meetings: Never    Marital Status: Divorced  Catering manager Violence: Not At Risk (10/08/2023)    Humiliation, Afraid, Rape, and Kick questionnaire    Fear of Current or Ex-Partner: No    Emotionally Abused: No    Physically Abused: No    Sexually Abused: No     Review of System:   General: Negative for anorexia, weight loss, fever, chills, fatigue, weakness. +hiccups. Eyes: Negative for vision changes.  ENT: Negative for hoarseness,   nasal congestion.see hpi+ CV: Negative for chest pain, angina, palpitations, dyspnea on exertion, peripheral edema.  Respiratory: Negative for dyspnea at rest, dyspnea on exertion, cough, sputum, wheezing.  GI: See history of present illness. GU:  Negative for dysuria, hematuria, urinary incontinence, urinary frequency, nocturnal urination.  MS: Negative for joint pain, low back pain.  Derm: Negative for rash or itching.  Neuro: Negative for new weakness. neg seizure, frequent headaches, memory loss, confusion.  Psych: Negative for anxiety, depression, suicidal ideation, hallucinations.  Endo: Negative for unusual weight change.  Heme: Negative for bruising or bleeding. Allergy: Negative for rash or hives.      Physical Examination:   Vital signs in last 24 hours: Temp:  [98.2 F (36.8 C)-98.3 F (36.8 C)] 98.3 F (36.8 C) (11/14 0519) Pulse Rate:  [67-93] 80 (11/14 0519) Resp:  [16-17] 17 (11/14 0519) BP: (107-133)/(65-98) 107/65 (11/14 0519) SpO2:  [97 %-100 %] 97 % (11/14 0519) Last BM Date : 10/13/23  General: Well-nourished, well-developed in no acute distress. Sitting up in chair Head: Normocephalic, atraumatic.   Eyes: Conjunctiva pink, no icterus. Mouth: Oropharyngeal mucosa moist and pink , no lesions erythema or exudate. Neck: Supple without thyromegaly, masses, or lymphadenopathy.  Lungs: Clear to auscultation bilaterally.  Heart: Regular rate and rhythm, no murmurs rubs or gallops.  Abdomen: Bowel sounds are normal, nontender, nondistended, no hepatosplenomegaly or masses, no abdominal bruits or hernia , no rebound or  guarding.   Rectal: not performed Extremities: No lower extremity edema, clubbing, deformity.  Neuro: Alert and oriented x 4   Skin: Warm and dry, no rash or jaundice.   Psych: Alert and cooperative, normal mood and affect.        Intake/Output from previous day: 11/13 0701 - 11/14 0700 In: 360 [P.O.:360] Out: 1400 [Urine:900; Emesis/NG output:500] Intake/Output this shift: No intake/output data recorded.  Lab Results:   CBC Recent Labs    10/12/23 0413 10/13/23 0413 10/14/23 0422  WBC 12.1* 10.1 10.6*  HGB 14.4 14.0 12.9*  HCT 43.3 42.0 38.4*  MCV 83.0 82.8 82.6  PLT 234 228 215   BMET Recent Labs    10/12/23 0413 10/13/23 0413 10/14/23 0422  NA 143 138 137  K 4.0 3.3* 3.0*  CL 103 102 98  CO2 30 30 29   GLUCOSE 132* 124* 115*  BUN 19 20 18   CREATININE 1.46* 1.36* 1.21  CALCIUM 9.1 8.5* 8.4*   LFT No results for input(s): "BILITOT", "BILIDIR", "IBILI", "ALKPHOS", "AST", "ALT", "PROT", "ALBUMIN" in the last 72 hours.  Lipase No results for input(s): "LIPASE" in the last 72 hours.  PT/INR No results for input(s): "LABPROT", "INR" in the last 72 hours.   Hepatitis Panel No results for input(s): "HEPBSAG", "HCVAB", "HEPAIGM", "HEPBIGM" in the last 72 hours.   Imaging Studies:   DG Abd 1 View  Result Date: 10/12/2023 CLINICAL DATA:  [id number] Postoperative ileus (HCC) [id number] EXAM: ABDOMEN - 1 VIEW COMPARISON:  X-ray abdomen 10/10/23, CT abdomen pelvis 10/08/2023 FINDINGS: Multiple loops of  small bowel dilated up to 5.8 cm. Stool within the rectum. A no radio-opaque calculi or other significant radiographic abnormality are seen. IMPRESSION: Dilated loops of small bowel suggestive of bowel obstruction versus ileus. Electronically Signed   By: Tish Frederickson M.D.   On: 10/12/2023 18:19   DG Abd 1 View  Result Date: 10/10/2023 CLINICAL DATA:  Follow-up of ileus EXAM: ABDOMEN - 1 VIEW COMPARISON:  CT yesterday FINDINGS: 2 supine views of the abdomen and pelvis  demonstrate small bowel dilatation up to 5.1 cm, new/increased since yesterday's CT. Normal caliber colon with large volume stool throughout. Rectal stool ball of 8 cm suggests fecal impaction. No gross free intraperitoneal air. Gas-filled, mildly dilated stomach. IMPRESSION: New/increased small bowel dilatation favoring adynamic ileus versus partial small bowel obstruction. Colonic stool volume suggests constipation or fecal impaction. Electronically Signed   By: Jeronimo Greaves M.D.   On: 10/10/2023 13:46   DG CHEST PORT 1 VIEW  Result Date: 10/10/2023 CLINICAL DATA:  NG tube placement EXAM: PORTABLE CHEST 1 VIEW COMPARISON:  CXR 10/08/23 FINDINGS: No pleural effusion. No pneumothorax. Hazy opacity at the left lung base could represent atelectasis or infection. Enteric tube courses below diaphragm with the tip projecting over the expected location of the stomach and the side hole in the proximal stomach. Gastric gaseous distention. There also multiple prominent loops small bowel in the upper abdomen. Subdiaphragmatic lucency on the right may be artifactual, but if there is clinical concern for pneumoperitoneum further evaluation with a CT of the abdomen and pelvis should be considered. IMPRESSION: 1. Enteric tube courses below diaphragm with the tip projecting over the expected location of the stomach and the side hole in the proximal stomach. 2. Subdiaphragmatic lucency on the right may be artifactual, but if there is clinical concern for pneumoperitoneum further evaluation with a CT of the abdomen and pelvis should be considered. 3. Hazy opacity at the left lung base could represent atelectasis or infection. These results will be called to the ordering clinician or representative by the Radiologist Assistant, and communication documented in the PACS or [employer]. Electronically Signed   By: Lorenza Cambridge M.D.   On: 10/10/2023 11:20   CT CHEST ABDOMEN PELVIS W CONTRAST  Result Date:  10/08/2023 CLINICAL DATA:  Sepsis EXAM: CT CHEST, ABDOMEN, AND PELVIS WITH CONTRAST TECHNIQUE: Multidetector CT imaging of the chest, abdomen and pelvis was performed following the standard protocol during bolus administration of intravenous contrast. RADIATION DOSE REDUCTION: This exam was performed according to the departmental dose-optimization program which includes automated exposure control, adjustment of the mA and/or kV according to patient size and/or use of iterative reconstruction technique. CONTRAST:  OMNIPAQUE IOHEXOL 300 MG/ML  SOLN COMPARISON:  CT angio chest 12/03/2020, CT abdomen pelvis 04/10/2016 FINDINGS: CT CHEST FINDINGS Cardiovascular: Normal heart size. No significant pericardial effusion. The thoracic aorta is normal in caliber. Mild atherosclerotic plaque of the thoracic aorta. No coronary artery calcifications. 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 The main pulmonary artery is normal in caliber. No central pulmonary embolus. Limited evaluation more distally due to timing of contrast. Mediastinum/Nodes: No enlarged mediastinal, hilar, or axillary lymph nodes. Thyroid gland, trachea, and esophagus demonstrate no significant findings. Lungs/Pleura: No focal consolidation. No pulmonary nodule. No pulmonary mass. No pleural effusion. No pneumothorax. Musculoskeletal: Similar-appearing superficial subcutaneus soft tissue 1.7 cm fluid density lesion along the right axilla/chest wall likely representing a sebaceous cyst. No suspicious  lytic or blastic osseous lesions. No acute displaced fracture. CT ABDOMEN PELVIS FINDINGS Hepatobiliary: No focal liver abnormality. No gallstones, gallbladder wall thickening, or pericholecystic fluid. No biliary dilatation. Pancreas: No focal lesion. Normal pancreatic contour. No surrounding inflammatory changes. No main pancreatic ductal dilatation. Spleen: Normal in size  without focal abnormality. Adrenals/Urinary Tract: No adrenal nodule bilaterally. Bilateral kidneys enhance symmetrically. Fluid density lesions likely represent simple renal cysts. Simple renal cysts, in the absence of clinically indicated signs/symptoms, require no independent follow-up. No hydronephrosis. No hydroureter. The urinary bladder is unremarkable. Stomach/Bowel: Stomach is within normal limits. No evidence of bowel wall thickening or dilatation. The appendix is enlarged in caliber measuring up to 1.5 cm with associated appendiceal wall thickening and periappendiceal fat stranding. No definite appendicolith. 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 Multiple venous collaterals along the abdominal wall. No abdominal aorta or iliac aneurysm. Moderate atherosclerotic plaque of the aorta and its branches. No abdominal, pelvic, or inguinal lymphadenopathy. Reproductive: Prostate is unremarkable. Other: No intraperitoneal free fluid. No intraperitoneal free gas. No organized fluid collection. Musculoskeletal: Tiny fat containing umbilical hernia. Left gluteal cleft superficial subcutaneus soft tissue 2.7 cm fluid density lesion likely representing a sebaceous cyst. No suspicious lytic or blastic osseous lesions. No acute displaced fracture. Degenerative changes of the bilateral hips. IMPRESSION: 1. Acute non-perforated appendicitis (1.5 cm). No definite appendicoliths. 2. 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 3. No acute intrathoracic abnormality. Electronically Signed   By: Tish Frederickson M.D.   On: 10/08/2023 18:13   DG Chest Port 1 View  Result Date: 10/08/2023 CLINICAL DATA:  Fever.  Weakness. EXAM: PORTABLE CHEST 1 VIEW COMPARISON:  Chest radiograph dated Apr 24, 2022. FINDINGS: The heart size and mediastinal contours are within normal limits. Both lungs  are clear. No pneumothorax or pleural effusion. No acute osseous abnormality. IMPRESSION: No acute cardiopulmonary findings. Electronically Signed   By: Hart Robinsons M.D.   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  Noted to have right lower quadrant tenderness on exam, CT significant for acute appendicitis.  Status post robotic assisted laparoscopic appendectomy on November 9.  Clinical course has been complicated by postop ileus.  GI consulted for new dysphagia.    Pill dysphagia: onset this admission, noted after NG tube removed. No problems swallowing liquids or solids. No significant heartburn type symptoms. Oral exam unremarkable. Etiology unclear. CT imaging with normal esophagus on admission. Agree with barium esophagram +/- speech therapy evaluation/swallow eval prior to consideration of EGD.   Plan:   Await barium esophagram. No indication for urgent EGD at this time.    LOS: 6 days   We would like to thank you for the opportunity to participate in the care of [name].  Leanna Battles. [facility] [phone] 11/14/202410:58 AM

## 2023-10-14 NOTE — TOC Progression Note (Signed)
Transition of Care [facility]) - Progression Note    Patient Details  Name: Matthew Wise MRN: [id number] Date of Birth: [birth year]  [facility] CM/SW Contact  Villa Herb, [place] Phone Number: 10/14/2023, 4:13 PM  Clinical Narrative:    CSW spoke with pt at bedside to review bed offers for SNF. Pt would like to accept bed offer for [facility] at this time. CSW updated Debbie in admissions. Insurance auth to be updated. TOC to follow.   Expected Discharge Plan: Skilled Nursing Facility Barriers to Discharge: Continued Medical Work up  Expected Discharge Plan and Services In-house Referral: Clinical Social Work Discharge Planning Services: CM Consult Post Acute Care Choice: Skilled Nursing Facility Living arrangements for the past 2 months: Single Family Home                                       Social Determinants of Health (SDOH) Interventions SDOH Screenings   Food Insecurity: No Food Insecurity (10/08/2023)  Housing: Low Risk  (10/08/2023)  Transportation Needs: No Transportation Needs (10/08/2023)  Utilities: Not At Risk (10/08/2023)  Alcohol Screen: Low Risk  (10/01/2021)  Depression (PHQ2-9): Low Risk  (10/01/2023)  Financial Resource Strain: Low Risk  (10/15/2022)  Physical Activity: Insufficiently Active (10/01/2021)  Social Connections: Moderately Isolated (10/15/2022)  Stress: No Stress Concern Present (10/15/2022)  Tobacco Use: Medium Risk (10/09/2023)    Readmission Risk Interventions     No data to display

## 2023-10-15 ENCOUNTER — Telehealth: Payer: Self-pay | Admitting: Gastroenterology

## 2023-10-15 ENCOUNTER — Inpatient Hospital Stay (HOSPITAL_COMMUNITY): Payer: 59

## 2023-10-15 DIAGNOSIS — K9189 Other postprocedural complications and disorders of digestive system: Secondary | ICD-10-CM | POA: Diagnosis not present

## 2023-10-15 DIAGNOSIS — K3580 Unspecified acute appendicitis: Secondary | ICD-10-CM | POA: Diagnosis not present

## 2023-10-15 DIAGNOSIS — I69354 Hemiplegia and hemiparesis following cerebral infarction affecting left non-dominant side: Secondary | ICD-10-CM | POA: Diagnosis not present

## 2023-10-15 DIAGNOSIS — K567 Ileus, unspecified: Secondary | ICD-10-CM | POA: Diagnosis not present

## 2023-10-15 LAB — BASIC METABOLIC PANEL
Anion gap: 9 (ref 5–15)
BUN: 18 mg/dL (ref 6–20)
CO2: 28 mmol/L (ref 22–32)
Calcium: 8.2 mg/dL — ABNORMAL LOW (ref 8.9–10.3)
Chloride: 98 mmol/L (ref 98–111)
Creatinine, Ser: 1.47 mg/dL — ABNORMAL HIGH (ref 0.61–1.24)
GFR, Estimated: 54 mL/min — ABNORMAL LOW (ref >60)
Glucose, Bld: 100 mg/dL — ABNORMAL HIGH (ref 70–99)
Potassium: 3 mmol/L — ABNORMAL LOW (ref 3.5–5.1)
Sodium: 135 mmol/L (ref 135–145)

## 2023-10-15 LAB — MAGNESIUM: Magnesium: 2.1 mg/dL (ref 1.7–2.4)

## 2023-10-15 MED ORDER — POTASSIUM CHLORIDE 20 MEQ PO PACK: 20.0000 meq | PACK | Freq: Every day | ORAL | Status: DC

## 2023-10-15 MED ORDER — POLYETHYLENE GLYCOL 3350 17 G PO PACK: 17.0000 g | PACK | Freq: Every day | ORAL | Status: DC

## 2023-10-15 NOTE — Discharge Summary (Signed)
Physician Discharge Summary   Patient: Matthew Wise MRN: [id number] DOB: Aug 10, 1961  Admit date:     10/08/2023  Discharge date: 10/15/23  Discharge Physician: Onalee Hua Nishat Livingston   PCP: Anabel Halon, MD   Recommendations at discharge:  Please follow up with primary care provider within 1-2 weeks  Please repeat BMP and CBC in one week     Hospital Course:  62 y.o. male,with past medical history of CVA, with residual left-sided weakness, PE-DVT, on Pradaxa, CKD stage IIIb, hypothyroidism, hypertension, hyperlipidemia, patient presents to ED secondary to weakness, nausea, abdominal pain, subjective fever, he denies cough, congestion, chest pain, or vomiting, no dysuria. -In ED he was noted to be tender in right lower quadrant, CT abdomen pelvis has been obtained which was significant for acute appendicitis, his workup significant For creatinine 1.4, at baseline, low sodium at 3.2, and elevated white blood cell count at 15.4, patient was started on IV antibiotics, ED discussed with general surgery, they will hold on surgery today given he took his Pradaxa the morning of admission, and plan to go to the OR 11/9. He underwent robotic lap appendectomy 10/09/23.  He developed a post-op ileus and required NG decompression.  This improved and his NG was removed and his diet was slowly advanced.  He was evaluated by speech therapy for his dysphagia. They felt that patient did not have any functional or anatomic limitations to impeded his swallowing.  There may be some cognitive issues affecting his swallowing.  Esophagram was ordered.   Assessment and Plan: Acute appendicitis - Treated -Presented with abdominal pain, nausea, fever, CT abdomen pelvis significant for acute appendicitis -completed IV Zosyn per surgery team;  follow-up blood cultures NGTD -postop s/p robotic lap appendectomy 10/09/23    Post op Ileus -Pt developed abd distension, nausea and vomiting postop day 1 -discussed with Dr.  Robyne Peers, made NPO, placed NG tube initially -supportive measures given and slowly improved to advance diet  -pt now having bowel movements and repeat AXR neg for bowel dilatation -advance to full liquid>>dys 3 diet which the patient tolerated   Dysphagia -speech therapy eval>>ok to advance diet from dysphagia standpoint; may have cognitive issue impeding swallowing -advised to sit up, take small bits, crush meds in pudding or applesauce if having difficulty -11/15 esophagram--Severe esophageal dysmotility. Possible small hiatal hernia, possible narrowing at the GE junction. 13 mm barium tablet did not pass -pt advanced to soft diet which he is tolerating -he has follow up in the GI office ([facility]) set up in 2 weeks   Primary Hypertension>> hypotension (resolved) -held amlodipine and lisinopril initially -remain off anti-HTN meds -BP remains well controlled   Obesity last 2 Body mass index is 35.94 kg/m   History of CVA with residual left-sided deficits -Continue with aspirin, continue with statin    History of DVT -resumed Pradaxa   Hypokalemia - repleted with IV KCl and po - Mg is 2.3 -continue KCl packets 20 mEq once daily--would repeat BMP in one week after d/c   Hiccups - thorazine PRN   Hypothyroidism - resumed home levothyroxine   Mixed hyperlipidemia - Continue home statin    Chronic kidney disease, stage 3a -baseline creatinine 1.3-1.5 -serum creatinine 1.47 on day of dc           Consultants: GI, general surgery Procedures performed: none  Disposition: Skilled nursing facility Diet recommendation:  Dysphagia type 3 Thin Liquid DISCHARGE MEDICATION: Allergies as of 10/15/2023   No Known Allergies  Medication List     STOP taking these medications    amLODipine 5 MG tablet Commonly known as: NORVASC   doxycycline 100 MG tablet Commonly known as: VIBRA-TABS   lisinopril 2.5 MG tablet Commonly known as: ZESTRIL        TAKE these medications    aspirin EC 81 MG tablet Take 1 tablet (81 mg total) by mouth daily. Swallow whole.   atorvastatin 40 MG tablet Commonly known as: LIPITOR Take 1 tablet by mouth once daily   levETIRAcetam 500 MG tablet Commonly known as: KEPPRA Take 1 tablet (500 mg total) by mouth 2 (two) times daily.   levothyroxine 25 MCG tablet Commonly known as: SYNTHROID Take 1 tablet by mouth once daily   multivitamin tablet Take 1 tablet by mouth daily.   pantoprazole 40 MG tablet Commonly known as: PROTONIX Take 1 tablet (40 mg total) by mouth daily.   polyethylene glycol 17 g packet Commonly known as: MIRALAX / GLYCOLAX Take 17 g by mouth daily. Start taking on: October 16, 2023   potassium chloride 20 MEQ packet Commonly known as: KLOR-CON Take 20 mEq by mouth daily. X 7 days Start taking on: October 16, 2023   Pradaxa 150 MG Caps capsule Generic drug: dabigatran Take 1 capsule (150 mg total) by mouth every 12 (twelve) hours.   senna-docusate 8.6-50 MG tablet Commonly known as: Senokot-S Take 1 tablet by mouth at bedtime.   tamsulosin 0.4 MG Caps capsule Commonly known as: FLOMAX TAKE 1 CAPSULE BY MOUTH ONCE DAILY AFTER SUPPER        Contact information for follow-up providers     Pappayliou, Catherine A, DO. Call.   Specialty: General Surgery Why: Call to schedule a follow up appointment in 2 weeks Contact information: [address] [place] [facility] [address] [phone]              Contact information for after-discharge care     Destination     [facility] .   Service: Skilled Nursing Contact information: [address] [place] [address] [phone]                    Discharge Exam: Ceasar Mons Weights   10/08/23 1254  Weight: 120.2 kg   HEENT:  [place]/AT, No thrush, no icterus CV:  RRR, no rub, no S3, no S4 Lung:  CTA, no wheeze, no rhonchi Abd:   soft/+BS, NT Ext:  No edema, no lymphangitis, no synovitis, no rash   Condition at discharge: stable  The results of significant diagnostics from this hospitalization (including imaging, microbiology, ancillary and laboratory) are listed below for reference.   Imaging Studies: DG Abd 1 View  Result Date: 10/15/2023 CLINICAL DATA:  Postoperative ileus. EXAM: ABDOMEN - 1 VIEW COMPARISON:  October 12, 2023. FINDINGS: The bowel gas pattern is normal. Stool is noted in the rectum. No radio-opaque calculi or other significant radiographic abnormality are seen. IMPRESSION: No abnormal bowel dilatation is noted. Electronically Signed   By: Lupita Raider M.D.   On: 10/15/2023 15:20   DG ESOPHAGUS W SINGLE CM (SOL OR THIN BA)  Result Date: 10/15/2023 CLINICAL DATA:  Patient with a history of dysphagia. EXAM: ESOPHAGUS/BARIUM SWALLOW/TABLET STUDY TECHNIQUE: Single contrast examination was performed using thin liquid barium. This exam was performed by Alwyn Ren, NP, and was supervised and interpreted by Dr. Elby Showers. FLUOROSCOPY: Radiation Exposure Index (as provided by the fluoroscopic device): 20.10 mGy Kerma COMPARISON:  None  Available. FINDINGS: Swallowing: Appears normal. No vestibular penetration or aspiration seen. Pharynx: Unremarkable. Esophagus: Possible narrowing at the GE junction. Esophageal motility: Severe dysmotility with tertiary contractions and stasis of barium within the esophagus over many minutes. Hiatal Hernia: Possible small hiatal hernia. Gastroesophageal reflux: None visualized. Ingested 13mm barium tablet: Became stuck in the mid to lower esophagus. Other: None. IMPRESSION: Limited study due to patient's clinical status. Severe esophageal dysmotility. Possible small hiatal hernia, possible narrowing at the GE junction. 13 mm barium tablet did not pass. Electronically Signed   By: Marliss Coots M.D.   On: 10/15/2023 14:56   DG Abd 1 View  Result Date: 10/12/2023 CLINICAL  DATA:  [id number] Postoperative ileus (HCC) [id number] EXAM: ABDOMEN - 1 VIEW COMPARISON:  X-ray abdomen 10/10/23, CT abdomen pelvis 10/08/2023 FINDINGS: Multiple loops of small bowel dilated up to 5.8 cm. Stool within the rectum. A no radio-opaque calculi or other significant radiographic abnormality are seen. IMPRESSION: Dilated loops of small bowel suggestive of bowel obstruction versus ileus. Electronically Signed   By: Tish Frederickson M.D.   On: 10/12/2023 18:19   DG Abd 1 View  Result Date: 10/10/2023 CLINICAL DATA:  Follow-up of ileus EXAM: ABDOMEN - 1 VIEW COMPARISON:  CT yesterday FINDINGS: 2 supine views of the abdomen and pelvis demonstrate small bowel dilatation up to 5.1 cm, new/increased since yesterday's CT. Normal caliber colon with large volume stool throughout. Rectal stool ball of 8 cm suggests fecal impaction. No gross free intraperitoneal air. Gas-filled, mildly dilated stomach. IMPRESSION: New/increased small bowel dilatation favoring adynamic ileus versus partial small bowel obstruction. Colonic stool volume suggests constipation or fecal impaction. Electronically Signed   By: Jeronimo Greaves M.D.   On: 10/10/2023 13:46   DG CHEST PORT 1 VIEW  Result Date: 10/10/2023 CLINICAL DATA:  NG tube placement EXAM: PORTABLE CHEST 1 VIEW COMPARISON:  CXR 10/08/23 FINDINGS: No pleural effusion. No pneumothorax. Hazy opacity at the left lung base could represent atelectasis or infection. Enteric tube courses below diaphragm with the tip projecting over the expected location of the stomach and the side hole in the proximal stomach. Gastric gaseous distention. There also multiple prominent loops small bowel in the upper abdomen. Subdiaphragmatic lucency on the right may be artifactual, but if there is clinical concern for pneumoperitoneum further evaluation with a CT of the abdomen and pelvis should be considered. IMPRESSION: 1. Enteric tube courses below diaphragm with the tip projecting over the expected  location of the stomach and the side hole in the proximal stomach. 2. Subdiaphragmatic lucency on the right may be artifactual, but if there is clinical concern for pneumoperitoneum further evaluation with a CT of the abdomen and pelvis should be considered. 3. Hazy opacity at the left lung base could represent atelectasis or infection. These results will be called to the ordering clinician or representative by the Radiologist Assistant, and communication documented in the PACS or [employer]. Electronically Signed   By: Lorenza Cambridge M.D.   On: 10/10/2023 11:20   CT CHEST ABDOMEN PELVIS W CONTRAST  Result Date: 10/08/2023 CLINICAL DATA:  Sepsis EXAM: CT CHEST, ABDOMEN, AND PELVIS WITH CONTRAST TECHNIQUE: Multidetector CT imaging of the chest, abdomen and pelvis was performed following the standard protocol during bolus administration of intravenous contrast. RADIATION DOSE REDUCTION: This exam was performed according to the departmental dose-optimization program which includes automated exposure control, adjustment of the mA and/or kV according to patient size and/or use of iterative reconstruction technique. CONTRAST:  OMNIPAQUE IOHEXOL 300  MG/ML  SOLN COMPARISON:  CT angio chest 12/03/2020, CT abdomen pelvis 04/10/2016 FINDINGS: CT CHEST FINDINGS Cardiovascular: Normal heart size. No significant pericardial effusion. The thoracic aorta is normal in caliber. Mild atherosclerotic plaque of the thoracic aorta. No coronary artery calcifications. 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 The main pulmonary artery is normal in caliber. No central pulmonary embolus. Limited evaluation more distally due to timing of contrast. Mediastinum/Nodes: No enlarged mediastinal, hilar, or axillary lymph nodes. Thyroid gland, trachea, and esophagus demonstrate no significant findings. Lungs/Pleura: No focal consolidation. No pulmonary  nodule. No pulmonary mass. No pleural effusion. No pneumothorax. Musculoskeletal: Similar-appearing superficial subcutaneus soft tissue 1.7 cm fluid density lesion along the right axilla/chest wall likely representing a sebaceous cyst. No suspicious lytic or blastic osseous lesions. No acute displaced fracture. CT ABDOMEN PELVIS FINDINGS Hepatobiliary: No focal liver abnormality. No gallstones, gallbladder wall thickening, or pericholecystic fluid. No biliary dilatation. Pancreas: No focal lesion. Normal pancreatic contour. No surrounding inflammatory changes. No main pancreatic ductal dilatation. Spleen: Normal in size without focal abnormality. Adrenals/Urinary Tract: No adrenal nodule bilaterally. Bilateral kidneys enhance symmetrically. Fluid density lesions likely represent simple renal cysts. Simple renal cysts, in the absence of clinically indicated signs/symptoms, require no independent follow-up. No hydronephrosis. No hydroureter. The urinary bladder is unremarkable. Stomach/Bowel: Stomach is within normal limits. No evidence of bowel wall thickening or dilatation. The appendix is enlarged in caliber measuring up to 1.5 cm with associated appendiceal wall thickening and periappendiceal fat stranding. No definite appendicolith. 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 Multiple venous collaterals along the abdominal wall. No abdominal aorta or iliac aneurysm. Moderate atherosclerotic plaque of the aorta and its branches. No abdominal, pelvic, or inguinal lymphadenopathy. Reproductive: Prostate is unremarkable. Other: No intraperitoneal free fluid. No intraperitoneal free gas. No organized fluid collection. Musculoskeletal: Tiny fat containing umbilical hernia. Left gluteal cleft superficial subcutaneus soft tissue 2.7 cm fluid density lesion likely representing a sebaceous cyst. No suspicious lytic or blastic osseous lesions. No acute displaced fracture.  Degenerative changes of the bilateral hips. IMPRESSION: 1. Acute non-perforated appendicitis (1.5 cm). No definite appendicoliths. 2. 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 3. No acute intrathoracic abnormality. Electronically Signed   By: Tish Frederickson M.D.   On: 10/08/2023 18:13   DG Chest Port 1 View  Result Date: 10/08/2023 CLINICAL DATA:  Fever.  Weakness. EXAM: PORTABLE CHEST 1 VIEW COMPARISON:  Chest radiograph dated Apr 24, 2022. FINDINGS: The heart size and mediastinal contours are within normal limits. Both lungs are clear. No pneumothorax or pleural effusion. No acute osseous abnormality. IMPRESSION: No acute cardiopulmonary findings. Electronically Signed   By: Hart Robinsons M.D.   On: 10/08/2023 16:24    Microbiology: Results for orders placed or performed during the hospital encounter of 10/08/23  Resp panel by RT-PCR (RSV, Flu A&B, Covid) Anterior Nasal Swab     Status: None   Collection Time: 10/08/23  1:00 PM   Specimen: Anterior Nasal Swab  Result Value Ref Range Status   SARS Coronavirus 2 by RT PCR NEGATIVE NEGATIVE Final    Comment: (NOTE) SARS-CoV-2 target nucleic acids are NOT DETECTED.  The SARS-CoV-2 RNA is generally detectable in upper respiratory specimens during the acute phase of infection. The lowest concentration of SARS-CoV-2 viral copies this assay can detect is 138 copies/mL. A  negative result does not preclude SARS-Cov-2 infection and should not be used as the sole basis for treatment or other patient management decisions. A negative result may occur with  improper specimen collection/handling, submission of specimen other than nasopharyngeal swab, presence of viral mutation(s) within the areas targeted by this assay, and inadequate number of viral copies(<138 copies/mL). A negative result must be combined with clinical observations, patient history, and  epidemiological information. The expected result is Negative.  Fact Sheet for Patients:  BloggerCourse.com  Fact Sheet for Healthcare Providers:  SeriousBroker.it  This test is no t yet approved or cleared by the [place] FDA and  has been authorized for detection and/or diagnosis of SARS-CoV-2 by FDA under an Emergency Use Authorization (EUA). This EUA will remain  in effect (meaning this test can be used) for the duration of the COVID-19 declaration under Section 564(b)(1) of the Act, 21 U.S.C.section 360bbb-3(b)(1), unless the authorization is terminated  or revoked sooner.       Influenza A by PCR NEGATIVE NEGATIVE Final   Influenza B by PCR NEGATIVE NEGATIVE Final    Comment: (NOTE) The Xpert Xpress SARS-CoV-2/FLU/RSV plus assay is intended as an aid in the diagnosis of influenza from Nasopharyngeal swab specimens and should not be used as a sole basis for treatment. Nasal washings and aspirates are unacceptable for Xpert Xpress SARS-CoV-2/FLU/RSV testing.  Fact Sheet for Patients: BloggerCourse.com  Fact Sheet for Healthcare Providers: SeriousBroker.it  This test is not yet approved or cleared by the [place] FDA and has been authorized for detection and/or diagnosis of SARS-CoV-2 by FDA under an Emergency Use Authorization (EUA). This EUA will remain in effect (meaning this test can be used) for the duration of the COVID-19 declaration under Section 564(b)(1) of the Act, 21 U.S.C. section 360bbb-3(b)(1), unless the authorization is terminated or revoked.     Resp Syncytial Virus by PCR NEGATIVE NEGATIVE Final    Comment: (NOTE) Fact Sheet for Patients: BloggerCourse.com  Fact Sheet for Healthcare Providers: SeriousBroker.it  This test is not yet approved or cleared by the [place] FDA and has been  authorized for detection and/or diagnosis of SARS-CoV-2 by FDA under an Emergency Use Authorization (EUA). This EUA will remain in effect (meaning this test can be used) for the duration of the COVID-19 declaration under Section 564(b)(1) of the Act, 21 U.S.C. section 360bbb-3(b)(1), unless the authorization is terminated or revoked.  Performed at [facility], [address] [place], [place] [address]   Blood Culture (routine x 2)     Status: None   Collection Time: 10/08/23  1:19 PM   Specimen: BLOOD  Result Value Ref Range Status   Specimen Description BLOOD rt forearm  Final   Special Requests   Final    BOTTLES DRAWN AEROBIC AND ANAEROBIC Blood Culture adequate volume   Culture   Final    NO GROWTH 5 DAYS Performed at [facility], [address], [place], [place] [address]    Report Status 10/13/2023 FINAL  Final  Blood Culture (routine x 2)     Status: None   Collection Time: 10/08/23  1:20 PM   Specimen: BLOOD  Result Value Ref Range Status   Specimen Description BLOOD BLOOD RIGHT HAND  Final   Special Requests   Final    BOTTLES DRAWN AEROBIC AND ANAEROBIC Blood Culture results may not be optimal due to an excessive volume of blood received in culture bottles   Culture   Final    NO  GROWTH 5 DAYS Performed at [facility], [address] [place], [place] [address]    Report Status 10/13/2023 FINAL  Final  Surgical pcr screen     Status: None   Collection Time: 10/09/23  7:30 AM   Specimen: Nasal Mucosa; Nasal Swab  Result Value Ref Range Status   MRSA, PCR NEGATIVE NEGATIVE Final   Staphylococcus aureus NEGATIVE NEGATIVE Final    Comment: (NOTE) The Xpert SA Assay (FDA approved for NASAL specimens in patients 85 years of age and older), is one component of a comprehensive surveillance program. It is not intended to diagnose infection nor to guide or monitor treatment. Performed at [facility], [address] [place], [place] [address]    *Note: Due to a large  number of results and/or encounters for the requested time period, some results have not been displayed. A complete set of results can be found in Results Review.    Labs: CBC: Recent Labs  Lab 10/10/23 0424 10/11/23 0411 10/12/23 0413 10/13/23 0413 10/14/23 0422  WBC 13.3* 10.7* 12.1* 10.1 10.6*  NEUTROABS 11.3*  --  10.3* 7.9*  --   HGB 13.9 14.4 14.4 14.0 12.9*  HCT 40.9 42.5 43.3 42.0 38.4*  MCV 82.3 81.9 83.0 82.8 82.6  PLT 189 201 234 228 215   Basic Metabolic Panel: Recent Labs  Lab 10/11/23 0411 10/12/23 0413 10/13/23 0413 10/14/23 0422 10/15/23 0359  NA 139 143 138 137 135  K 3.3* 4.0 3.3* 3.0* 3.0*  CL 104 103 102 98 98  CO2 26 30 30 29 28   GLUCOSE 120* 132* 124* 115* 100*  BUN 20 19 20 18 18   CREATININE 1.54* 1.46* 1.36* 1.21 1.47*  CALCIUM 8.8* 9.1 8.5* 8.4* 8.2*  MG 2.2  --  2.3 2.1 2.1  PHOS  --   --  2.4* 2.9  --    Liver Function Tests: No results for input(s): "AST", "ALT", "ALKPHOS", "BILITOT", "PROT", "ALBUMIN" in the last 168 hours. CBG: No results for input(s): "GLUCAP" in the last 168 hours.  Discharge time spent: greater than 30 minutes.  Signed: Catarina Hartshorn, MD [facility] 10/15/2023

## 2023-10-15 NOTE — Consult Note (Signed)
[facility] Liaison Note  10/15/2023  ALEKZANDR SCHLEPER [birth year] [id number]  Location: RN Hospital Liaison screened the patient remotely at [facility].  Insurance: [employer] Primary Care Provider:  Trena Platt [facility])  Patient is currently active with Care Management for chronic disease management services.  Patient has been engaged by a Child psychotherapist.  Our community based plan of care has focused on disease management and community resource support.   Patient will receive a post hospital call and will be evaluated for assessments and disease process education.   Plan: Pt will discharged to SNF level of care for ongoing STR.   Inpatient Transition Of Care [TOC] team member to make aware that Care Management following.  Of note, Care Management services does not replace or interfere with any services that are needed or arranged by inpatient [facility] care management team.   For additional questions or referrals please contact:   Hospital Liaison [place]   [facility] Office Hours MTWF  8:00 am-6:00 pm Direct Dial: [phone] mobile [phone] [Office toll free line] Office Hours are M-F 8:30 - 5 pm [email or web address]

## 2023-10-15 NOTE — Progress Notes (Signed)
Pt slept off an own throughout the night .  He has had hiccups most of the night.  He stated that they come and go and its been going on for a couple of days.

## 2023-10-15 NOTE — TOC Transition Note (Signed)
Transition of Care [facility]) - CM/SW Discharge Note   Patient Details  Name: Matthew Wise MRN: [id number] Date of Birth: 1961/03/24  Transition of Care [facility]) CM/SW Contact:  Villa Herb, LCSWA Phone Number: 10/15/2023, 4:00 PM   Clinical Narrative:    CSW updated that pt is medically stable for D/C. CSW provided RN with room and report numbers. CSW sent D/C clinicals to facility via [facility]. Debbie with [facility] states they are ready to accept. Med necessity printed to floor. EMS called for transport. TOC signing off.   Final next level of care: Skilled Nursing Facility Barriers to Discharge: Barriers Resolved   Patient Goals and CMS Choice CMS Medicare.gov Compare Post Acute Care list provided to:: Patient Choice offered to / list presented to : Patient  Discharge Placement                         Discharge Plan and Services Additional resources added to the After Visit Summary for   In-house Referral: Clinical Social Work Discharge Planning Services: CM Consult Post Acute Care Choice: Skilled Nursing Facility                               Social Determinants of Health (SDOH) Interventions SDOH Screenings   Food Insecurity: No Food Insecurity (10/08/2023)  Housing: Low Risk  (10/08/2023)  Transportation Needs: No Transportation Needs (10/08/2023)  Utilities: Not At Risk (10/08/2023)  Alcohol Screen: Low Risk  (10/01/2021)  Depression (PHQ2-9): Low Risk  (10/01/2023)  Financial Resource Strain: Low Risk  (10/15/2022)  Physical Activity: Insufficiently Active (10/01/2021)  Social Connections: Moderately Isolated (10/15/2022)  Stress: No Stress Concern Present (10/15/2022)  Tobacco Use: Medium Risk (10/09/2023)     Readmission Risk Interventions     No data to display

## 2023-10-15 NOTE — Progress Notes (Signed)
EMS here to transport patient to [facility]. Patient vitals checked. Stable. EMS given report. [facility] called, report given to the Nurse, Haig Prophet.   Patient has all of his belongings with him except he is missing one of his shoes. 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 Patient room was searched and the shoe is no where to be found. Patient stated he had both of them earlier today when he worked with PT but is unsure if someone accidentally threw it away.

## 2023-10-15 NOTE — Care Management Important Message (Signed)
Important Message  Patient Details  Name: Matthew Wise MRN: [id number] Date of Birth: 02/01/1961   Important Message Given:  Yes - Medicare IM     Corey Harold 10/15/2023, 12:01 PM

## 2023-10-15 NOTE — Progress Notes (Signed)
Physical Therapy Treatment Patient Details Name: Matthew Wise MRN: [id number] DOB: Dec 17, 1960 Today's Date: 10/15/2023   History of Present Illness Matthew Wise  is a 62 y.o. male,with past medical history of CVA, with residual left-sided weakness, PE-DVT, on Pradaxa, CKD stage IIIb, hypothyroidism, hypertension, hyperlipidemia, patient presents to ED secondary to weakness, nausea, abdominal pain, subjective fever, he denies cough, congestion, chest pain, or vomiting, no dysuria.  -In ED he was noted to be tender in right lower quadrant, CT abdomen pelvis has been obtained which was significant for acute appendicitis, his workup significant For creatinine 1.4, at baseline, low sodium at 3.2, and elevated white blood cell count at 15.4, patient was started on IV antibiotics, ED discussed with general surgery, they will hold on surgery today given he took his Pradaxa this morning, and plan to go to the OR tomorrow, [facility] hospitalist consulted to admit    PT Comments  Pt supine in bed and wiling to participate with therapy today.  Pt required handrail assistance and body momentum for mod I bed mobility.  Pt requested to wear shoes while walking, able to put Rt on I, required A with Lt LE.  Min A with transfer and gait training, cueing for hand and Lt foot placement prior standing.  Stable while standing with QC and able to ambulate safely with no LOB episodes.  EOS pt left in chair with call bell within reach.    If plan is discharge home, recommend the following:     Can travel by private vehicle        Equipment Recommendations       Recommendations for Other Services       Precautions / Restrictions Precautions Precautions: Fall Restrictions Weight Bearing Restrictions: No     Mobility  Bed Mobility Overal bed mobility: Modified Independent Bed Mobility: Supine to Sit     Supine to sit: Supervision     General bed mobility comments: increased time, need for handrail  assistance, labored movement    Transfers Overall transfer level: Needs assistance Equipment used: Quad cane Transfers: Sit to/from Stand Sit to Stand: Min assist           General transfer comment: increased time, labored movement; cueing for Lt foot and Rt hand positioning prior standing    Ambulation/Gait Ambulation/Gait assistance: Min assist, Mod assist Gait Distance (Feet): 28 Feet Assistive device: Quad cane Gait Pattern/deviations: Decreased step length - right, Decreased step length - left, Decreased stride length, Decreased stance time - left, Ataxic, Antalgic, Steppage Gait velocity: decreased     General Gait Details: slow labored movements   Stairs             Wheelchair Mobility     Tilt Bed    Modified Rankin (Stroke Patients Only)       Balance                                            Cognition Arousal: Alert Behavior During Therapy: WFL for tasks assessed/performed Overall Cognitive Status: Within Functional Limits for tasks assessed                                          Exercises      General Comments  Pertinent Vitals/Pain Pain Assessment Pain Assessment: No/denies pain    Home Living                          Prior Function            PT Goals (current goals can now be found in the care plan section)      Frequency           PT Plan      Co-evaluation              AM-PAC PT "6 Clicks" Mobility   Outcome Measure  Help needed turning from your back to your side while in a flat bed without using bedrails?: A Little Help needed moving from lying on your back to sitting on the side of a flat bed without using bedrails?: A Little Help needed moving to and from a bed to a chair (including a wheelchair)?: A Little Help needed standing up from a chair using your arms (e.g., wheelchair or bedside chair)?: A Little Help needed to walk in hospital room?:  A Lot Help needed climbing 3-5 steps with a railing? : A Lot 6 Click Score: 16    End of Session Equipment Utilized During Treatment: Gait belt Activity Tolerance: Patient tolerated treatment well;Patient limited by fatigue Patient left: in chair;with call bell/phone within reach Nurse Communication: Mobility status PT Visit Diagnosis: Unsteadiness on feet (R26.81);Other abnormalities of gait and mobility (R26.89);Muscle weakness (generalized) (M62.81);Hemiplegia and hemiparesis     Time: 0750-0818 PT Time Calculation (min) (ACUTE ONLY): 28 min  Charges:    $Therapeutic Activity: 23-37 mins PT General Charges $$ ACUTE PT VISIT: 1 Visit                     Becky Sax, LPTA/CLT; CBIS [phone]  Juel Burrow 10/15/2023, 9:12 AM

## 2023-10-15 NOTE — Plan of Care (Signed)
  Problem: Education: Goal: Knowledge of General Education information will improve Description: Including pain rating scale, medication(s)/side effects and non-pharmacologic comfort measures Outcome: Progressing   Problem: Clinical Measurements: Goal: Will remain free from infection Outcome: Progressing   Problem: Pain Management: Goal: General experience of comfort will improve Outcome: Progressing

## 2023-10-15 NOTE — Progress Notes (Signed)
[facility] Progress Note  6 Days Post-Op  Subjective: Patient seen and examined.  He is resting comfortably in the chair.  He is tolerating his dysphagic 3 diet without nausea and vomiting.  He confirms passing flatus but denies bowel movements in the last 24 hours.  Objective: Vital signs in last 24 hours: Temp:  [98 F (36.7 C)-98.9 F (37.2 C)] 98 F (36.7 C) (11/15 0852) Pulse Rate:  [64-75] 64 (11/15 0852) Resp:  [16] 16 (11/14 2042) BP: (98-129)/(58-77) 104/69 (11/15 0852) SpO2:  [94 %-100 %] 99 % (11/15 0852) Last BM Date : 10/14/23  Intake/Output from previous day: 11/14 0701 - 11/15 0700 In: 720 [P.O.:720] Out: 500 [Urine:500] Intake/Output this shift: No intake/output data recorded.  General appearance: alert, cooperative, and no distress GI: Abdomen soft, mild distention, no percussion tenderness, mild right sided tenderness to palpation; no rigidity, guarding, or rebound tenderness; incisions C/D/I with dermabond in place  Lab Results:  Recent Labs    10/13/23 0413 10/14/23 0422  WBC 10.1 10.6*  HGB 14.0 12.9*  HCT 42.0 38.4*  PLT 228 215   BMET Recent Labs    10/14/23 0422 10/15/23 0359  NA 137 135  K 3.0* 3.0*  CL 98 98  CO2 29 28  GLUCOSE 115* 100*  BUN 18 18  CREATININE 1.21 1.47*  CALCIUM 8.4* 8.2*   PT/INR No results for input(s): "LABPROT", "INR" in the last 72 hours.  Studies/Results: No results found.  Anti-infectives: Anti-infectives (From admission, onward)    Start     Dose/Rate Route Frequency Ordered Stop   10/09/23 1600  vancomycin (VANCOCIN) IVPB 1000 mg/200 mL premix  Status:  Discontinued       Placed in "And" Linked Group   1,000 mg 200 mL/hr over 60 Minutes Intravenous Every 24 hours 10/08/23 1459 10/08/23 2049   10/09/23 1500  vancomycin (VANCOCIN) IVPB 1000 mg/200 mL premix  Status:  Discontinued       Placed in "And" Linked Group   1,000 mg 200 mL/hr over 60 Minutes Intravenous Every 24 hours  10/08/23 1459 10/08/23 2049   10/08/23 2000  piperacillin-tazobactam (ZOSYN) IVPB 3.375 g  Status:  Discontinued        3.375 g 12.5 mL/hr over 240 Minutes Intravenous Every 8 hours 10/08/23 1937 10/11/23 1051   10/08/23 1500  vancomycin (VANCOCIN) IVPB 1000 mg/200 mL premix        1,000 mg 200 mL/hr over 60 Minutes Intravenous Every 1 hr x 2 10/08/23 1428 10/08/23 1755   10/08/23 1415  ceFEPIme (MAXIPIME) 2 g in sodium chloride 0.9 % 100 mL IVPB        2 g 200 mL/hr over 30 Minutes Intravenous  Once 10/08/23 1407 10/08/23 1558       Assessment/Plan:  Patient is a 62 year old male who was admitted with acute appendicitis.  He is status post robotic assisted laparoscopic appendectomy on 11/9.   -Continue dysphagia 3 diet. Advancement per SLP. Ok for advancement from a surgical standpoint -Continue bowel regimen -Continue to encourage ambulation. Appreciate PT recommendations -IV fluids per primary team -Okay to continue Pradaxa -Appreciate hospitalist recommendations -Patient tolerating a diet and passing flatus.  Stable for discharge from general surgery standpoint.  Patient will need to follow up with me in 2 weeks   LOS: 7 days    Khadijah Mastrianni A Raziya Aveni 10/15/2023

## 2023-10-15 NOTE — Telephone Encounter (Signed)
Patient seen in hospital for evaluation of dysphagia.   Please make hospital follow up with LSL or Dr. Marletta Lor in 2-3 weeks for dysphagia.   Matthew Bonito, MSN, APRN, FNP-BC, AGACNP-BC [facility]

## 2023-10-15 NOTE — Progress Notes (Signed)
Speech Language Pathology Treatment: Dysphagia  Patient Details Name: Matthew Wise MRN: [id number] DOB: [birth year] Today's Date: 10/15/2023 Time: 3664-4034 SLP Time Calculation (min) (ACUTE ONLY): 18 min  Assessment / Plan / Recommendation Clinical Impression  Ongoing dysphagia therapy provided today. Pt consumed thin liquids without overt s/sx of oropharyngeal dysphagia and denies difficulty with meals. Pt reports meds have been "going down good" since yesterday. If pt continues to have persistent issues coordinating oral stage of swallowing pills consider crushing meds and providing in applesauce or providing whole with puree, pudding or yogurt. There are no further ST needs indicated at this time. Thank you for this consult,    HPI HPI: 62 y/o male with past medical history of stroke with residual left-sided weakness, PE/DVT on Pradaxa, CKD stage IIIb, hypothyroidism, hypertension, hyperlipidemia admitted November 8 when he presented with weakness, nausea, abdominal pain, fever.  Noted to have right lower quadrant tenderness on exam, CT significant for acute appendicitis.  Status post robotic assisted laparoscopic appendectomy on November 9.  Clinical course has been complicated by postop ileus.   Pill dysphagia: onset this admission, noted after NG tube removed. No problems swallowing liquids or solids. BSE requested and GI was consulted and esophagram pending.      SLP Plan  Continue with current plan of care      Recommendations for follow up therapy are one component of a multi-disciplinary discharge planning process, led by the attending physician.  Recommendations may be updated based on patient status, additional functional criteria and insurance authorization.    Recommendations  Diet recommendations: Thin liquid;Regular Liquids provided via: Cup;Straw Medication Administration: Whole meds with liquid Supervision: Patient able to self feed Compensations: Minimize  environmental distractions Postural Changes and/or Swallow Maneuvers: Out of bed for meals                  Oral care BID   None Dysphagia, unspecified (R13.10)     Continue with current plan of care    Mischele Detter H. Romie Levee, CCC-SLP Speech Language Pathologist  Georgetta Haber  10/15/2023, 3:11 PM

## 2023-10-16 DIAGNOSIS — R1310 Dysphagia, unspecified: Secondary | ICD-10-CM | POA: Diagnosis not present

## 2023-10-16 DIAGNOSIS — K3580 Unspecified acute appendicitis: Secondary | ICD-10-CM | POA: Diagnosis not present

## 2023-10-16 DIAGNOSIS — M6281 Muscle weakness (generalized): Secondary | ICD-10-CM | POA: Diagnosis not present

## 2023-10-16 DIAGNOSIS — I10 Essential (primary) hypertension: Secondary | ICD-10-CM | POA: Diagnosis not present

## 2023-10-16 DIAGNOSIS — R2689 Other abnormalities of gait and mobility: Secondary | ICD-10-CM | POA: Diagnosis not present

## 2023-10-16 DIAGNOSIS — Z7401 Bed confinement status: Secondary | ICD-10-CM | POA: Diagnosis not present

## 2023-10-16 DIAGNOSIS — I639 Cerebral infarction, unspecified: Secondary | ICD-10-CM | POA: Diagnosis not present

## 2023-10-16 DIAGNOSIS — R531 Weakness: Secondary | ICD-10-CM | POA: Diagnosis not present

## 2023-10-16 DIAGNOSIS — I69392 Facial weakness following cerebral infarction: Secondary | ICD-10-CM | POA: Diagnosis not present

## 2023-10-16 DIAGNOSIS — N1831 Chronic kidney disease, stage 3a: Secondary | ICD-10-CM | POA: Diagnosis not present

## 2023-10-16 DIAGNOSIS — E782 Mixed hyperlipidemia: Secondary | ICD-10-CM | POA: Diagnosis not present

## 2023-10-16 DIAGNOSIS — R2681 Unsteadiness on feet: Secondary | ICD-10-CM | POA: Diagnosis not present

## 2023-10-16 DIAGNOSIS — E039 Hypothyroidism, unspecified: Secondary | ICD-10-CM | POA: Diagnosis not present

## 2023-10-16 DIAGNOSIS — R1312 Dysphagia, oropharyngeal phase: Secondary | ICD-10-CM | POA: Diagnosis not present

## 2023-10-16 DIAGNOSIS — U071 COVID-19: Secondary | ICD-10-CM | POA: Diagnosis not present

## 2023-10-16 DIAGNOSIS — K9189 Other postprocedural complications and disorders of digestive system: Secondary | ICD-10-CM | POA: Diagnosis not present

## 2023-10-16 DIAGNOSIS — J069 Acute upper respiratory infection, unspecified: Secondary | ICD-10-CM | POA: Diagnosis not present

## 2023-10-16 DIAGNOSIS — R5381 Other malaise: Secondary | ICD-10-CM | POA: Diagnosis not present

## 2023-10-18 DIAGNOSIS — N1831 Chronic kidney disease, stage 3a: Secondary | ICD-10-CM | POA: Diagnosis not present

## 2023-10-18 DIAGNOSIS — E039 Hypothyroidism, unspecified: Secondary | ICD-10-CM | POA: Diagnosis not present

## 2023-10-18 DIAGNOSIS — E782 Mixed hyperlipidemia: Secondary | ICD-10-CM | POA: Diagnosis not present

## 2023-10-18 DIAGNOSIS — I10 Essential (primary) hypertension: Secondary | ICD-10-CM | POA: Diagnosis not present

## 2023-10-18 DIAGNOSIS — I69392 Facial weakness following cerebral infarction: Secondary | ICD-10-CM | POA: Diagnosis not present

## 2023-10-18 DIAGNOSIS — M6281 Muscle weakness (generalized): Secondary | ICD-10-CM | POA: Diagnosis not present

## 2023-10-18 DIAGNOSIS — K3580 Unspecified acute appendicitis: Secondary | ICD-10-CM | POA: Diagnosis not present

## 2023-10-18 DIAGNOSIS — R1310 Dysphagia, unspecified: Secondary | ICD-10-CM | POA: Diagnosis not present

## 2023-10-19 ENCOUNTER — Ambulatory Visit: Payer: Self-pay

## 2023-10-19 NOTE — Patient Outreach (Signed)
  Care Coordination   10/19/2023 Name: Matthew Wise MRN: [id number] DOB: Jul 15, 1961   Care Coordination Outreach Attempts:  An unsuccessful telephone outreach was attempted for a scheduled appointment today.  Follow Up Plan:  Additional outreach attempts will be made to offer the patient care coordination information and services.   Encounter Outcome:  No Answer   Care Coordination Interventions:  No, not indicated    SIG Lysle Morales, BSW Social Worker [phone]

## 2023-10-20 DIAGNOSIS — E782 Mixed hyperlipidemia: Secondary | ICD-10-CM | POA: Diagnosis not present

## 2023-10-20 DIAGNOSIS — I69392 Facial weakness following cerebral infarction: Secondary | ICD-10-CM | POA: Diagnosis not present

## 2023-10-20 DIAGNOSIS — N1831 Chronic kidney disease, stage 3a: Secondary | ICD-10-CM | POA: Diagnosis not present

## 2023-10-20 DIAGNOSIS — I10 Essential (primary) hypertension: Secondary | ICD-10-CM | POA: Diagnosis not present

## 2023-10-20 DIAGNOSIS — M6281 Muscle weakness (generalized): Secondary | ICD-10-CM | POA: Diagnosis not present

## 2023-10-20 DIAGNOSIS — E039 Hypothyroidism, unspecified: Secondary | ICD-10-CM | POA: Diagnosis not present

## 2023-10-20 DIAGNOSIS — R1310 Dysphagia, unspecified: Secondary | ICD-10-CM | POA: Diagnosis not present

## 2023-10-20 DIAGNOSIS — K3580 Unspecified acute appendicitis: Secondary | ICD-10-CM | POA: Diagnosis not present

## 2023-10-21 DIAGNOSIS — N1831 Chronic kidney disease, stage 3a: Secondary | ICD-10-CM | POA: Diagnosis not present

## 2023-10-21 DIAGNOSIS — I10 Essential (primary) hypertension: Secondary | ICD-10-CM | POA: Diagnosis not present

## 2023-10-21 DIAGNOSIS — R1310 Dysphagia, unspecified: Secondary | ICD-10-CM | POA: Diagnosis not present

## 2023-10-21 DIAGNOSIS — E782 Mixed hyperlipidemia: Secondary | ICD-10-CM | POA: Diagnosis not present

## 2023-10-21 DIAGNOSIS — K3580 Unspecified acute appendicitis: Secondary | ICD-10-CM | POA: Diagnosis not present

## 2023-10-21 DIAGNOSIS — I69392 Facial weakness following cerebral infarction: Secondary | ICD-10-CM | POA: Diagnosis not present

## 2023-10-21 DIAGNOSIS — E039 Hypothyroidism, unspecified: Secondary | ICD-10-CM | POA: Diagnosis not present

## 2023-10-21 DIAGNOSIS — M6281 Muscle weakness (generalized): Secondary | ICD-10-CM | POA: Diagnosis not present

## 2023-10-22 DIAGNOSIS — E039 Hypothyroidism, unspecified: Secondary | ICD-10-CM | POA: Diagnosis not present

## 2023-10-22 DIAGNOSIS — R1310 Dysphagia, unspecified: Secondary | ICD-10-CM | POA: Diagnosis not present

## 2023-10-22 DIAGNOSIS — I10 Essential (primary) hypertension: Secondary | ICD-10-CM | POA: Diagnosis not present

## 2023-10-22 DIAGNOSIS — N1831 Chronic kidney disease, stage 3a: Secondary | ICD-10-CM | POA: Diagnosis not present

## 2023-10-22 DIAGNOSIS — K3580 Unspecified acute appendicitis: Secondary | ICD-10-CM | POA: Diagnosis not present

## 2023-10-22 DIAGNOSIS — M6281 Muscle weakness (generalized): Secondary | ICD-10-CM | POA: Diagnosis not present

## 2023-10-22 DIAGNOSIS — I69392 Facial weakness following cerebral infarction: Secondary | ICD-10-CM | POA: Diagnosis not present

## 2023-10-22 DIAGNOSIS — E782 Mixed hyperlipidemia: Secondary | ICD-10-CM | POA: Diagnosis not present

## 2023-10-25 ENCOUNTER — Telehealth: Payer: Self-pay | Admitting: *Deleted

## 2023-10-25 DIAGNOSIS — U071 COVID-19: Secondary | ICD-10-CM | POA: Diagnosis not present

## 2023-10-25 DIAGNOSIS — J069 Acute upper respiratory infection, unspecified: Secondary | ICD-10-CM | POA: Diagnosis not present

## 2023-10-25 NOTE — Telephone Encounter (Signed)
Surgical Date: 10/09/2023 Procedure: XI ROBOTIC LAPAROSCOPIC ASSISTED APPENDECTOMY  Received call from [facility].   Patient has tested positive for COVID. Appointment rescheduled for >10 days per [facility] recommendations.

## 2023-10-26 DIAGNOSIS — J069 Acute upper respiratory infection, unspecified: Secondary | ICD-10-CM | POA: Diagnosis not present

## 2023-10-26 DIAGNOSIS — U071 COVID-19: Secondary | ICD-10-CM | POA: Diagnosis not present

## 2023-10-27 ENCOUNTER — Encounter: Payer: 59 | Admitting: Surgery

## 2023-10-27 DIAGNOSIS — I10 Essential (primary) hypertension: Secondary | ICD-10-CM | POA: Diagnosis not present

## 2023-10-27 DIAGNOSIS — R1310 Dysphagia, unspecified: Secondary | ICD-10-CM | POA: Diagnosis not present

## 2023-10-27 DIAGNOSIS — E039 Hypothyroidism, unspecified: Secondary | ICD-10-CM | POA: Diagnosis not present

## 2023-10-27 DIAGNOSIS — U071 COVID-19: Secondary | ICD-10-CM | POA: Diagnosis not present

## 2023-10-27 DIAGNOSIS — M6281 Muscle weakness (generalized): Secondary | ICD-10-CM | POA: Diagnosis not present

## 2023-10-27 DIAGNOSIS — N1831 Chronic kidney disease, stage 3a: Secondary | ICD-10-CM | POA: Diagnosis not present

## 2023-10-27 DIAGNOSIS — E782 Mixed hyperlipidemia: Secondary | ICD-10-CM | POA: Diagnosis not present

## 2023-10-27 DIAGNOSIS — K3580 Unspecified acute appendicitis: Secondary | ICD-10-CM | POA: Diagnosis not present

## 2023-10-30 DIAGNOSIS — Z7901 Long term (current) use of anticoagulants: Secondary | ICD-10-CM | POA: Diagnosis not present

## 2023-10-30 DIAGNOSIS — E039 Hypothyroidism, unspecified: Secondary | ICD-10-CM | POA: Diagnosis not present

## 2023-10-30 DIAGNOSIS — Z7982 Long term (current) use of aspirin: Secondary | ICD-10-CM | POA: Diagnosis not present

## 2023-10-30 DIAGNOSIS — I129 Hypertensive chronic kidney disease with stage 1 through stage 4 chronic kidney disease, or unspecified chronic kidney disease: Secondary | ICD-10-CM | POA: Diagnosis not present

## 2023-10-30 DIAGNOSIS — E782 Mixed hyperlipidemia: Secondary | ICD-10-CM | POA: Diagnosis not present

## 2023-10-30 DIAGNOSIS — I82509 Chronic embolism and thrombosis of unspecified deep veins of unspecified lower extremity: Secondary | ICD-10-CM | POA: Diagnosis not present

## 2023-10-30 DIAGNOSIS — Z556 Problems related to health literacy: Secondary | ICD-10-CM | POA: Diagnosis not present

## 2023-10-30 DIAGNOSIS — R1310 Dysphagia, unspecified: Secondary | ICD-10-CM | POA: Diagnosis not present

## 2023-10-30 DIAGNOSIS — U071 COVID-19: Secondary | ICD-10-CM | POA: Diagnosis not present

## 2023-10-30 DIAGNOSIS — I69354 Hemiplegia and hemiparesis following cerebral infarction affecting left non-dominant side: Secondary | ICD-10-CM | POA: Diagnosis not present

## 2023-10-30 DIAGNOSIS — Z9049 Acquired absence of other specified parts of digestive tract: Secondary | ICD-10-CM | POA: Diagnosis not present

## 2023-10-30 DIAGNOSIS — N1831 Chronic kidney disease, stage 3a: Secondary | ICD-10-CM | POA: Diagnosis not present

## 2023-10-30 DIAGNOSIS — J069 Acute upper respiratory infection, unspecified: Secondary | ICD-10-CM | POA: Diagnosis not present

## 2023-10-30 DIAGNOSIS — Z48815 Encounter for surgical aftercare following surgery on the digestive system: Secondary | ICD-10-CM | POA: Diagnosis not present

## 2023-11-03 ENCOUNTER — Inpatient Hospital Stay: Payer: 59 | Admitting: Gastroenterology

## 2023-11-04 ENCOUNTER — Ambulatory Visit (INDEPENDENT_AMBULATORY_CARE_PROVIDER_SITE_OTHER): Payer: 59 | Admitting: Surgery

## 2023-11-04 ENCOUNTER — Encounter: Payer: Self-pay | Admitting: Surgery

## 2023-11-04 VITALS — BP 116/78 | HR 75 | Temp 97.6°F | Resp 12 | Ht 72.0 in | Wt 251.0 lb

## 2023-11-04 DIAGNOSIS — Z09 Encounter for follow-up examination after completed treatment for conditions other than malignant neoplasm: Secondary | ICD-10-CM

## 2023-11-04 NOTE — Progress Notes (Signed)
[facility] Note   HPI:  62 y.o. Male presents to clinic for post-op follow-up status post robotic assisted laparoscopic appendectomy on 11/9 with postoperative admission for a postop ileus.  Since discharge home from the hospital, the patient has been doing well.  He is tolerating a diet without nausea and vomiting, and moving his bowels without issue.  His last bowel movement was yesterday.  He denies any abdominal pain.  Denies fevers and chills.  Review of Systems:  All other review of systems: otherwise negative   Vital Signs:  BP 116/78   Pulse 75   Temp 97.6 F (36.4 C) (Oral)   Resp 12   Ht 6' (1.829 m)   Wt 251 lb (113.9 kg)   SpO2 94%   BMI 34.04 kg/m    Physical Exam:  Physical Exam Vitals reviewed.  Constitutional:      Appearance: Normal appearance.  Abdominal:     Comments: Abdomen soft, nondistended, no percussion tenderness, nontender to palpation; no rigidity, guarding, rebound tenderness; incisions healing well  Neurological:     Mental Status: He is alert.     Laboratory studies: None  Imaging:  None  Pathology: A. APPENDIX, APPENDECTOMY:       Focal acute appendicitis with intramural abscess formation and  serositis.    Assessment:  62 y.o. yo Male who presents for follow-up status post robotic assisted laparoscopic appendectomy on 11/9 with subsequent admission with a postoperative ileus.  Plan:  -Patient overall doing well since surgery.  Tolerating a diet, moving his bowels, and pain well-controlled -We discussed his pathology results -Follow up with me as needed  All of the above recommendations were discussed with the patient, and all of patient's questions were answered to their expressed satisfaction.  Theophilus Kinds, DO [facility] [address] [place]ella R[place], [place] [address] [phone] (office)

## 2023-11-05 ENCOUNTER — Telehealth: Payer: Self-pay | Admitting: Internal Medicine

## 2023-11-05 DIAGNOSIS — I82509 Chronic embolism and thrombosis of unspecified deep veins of unspecified lower extremity: Secondary | ICD-10-CM | POA: Diagnosis not present

## 2023-11-05 DIAGNOSIS — I129 Hypertensive chronic kidney disease with stage 1 through stage 4 chronic kidney disease, or unspecified chronic kidney disease: Secondary | ICD-10-CM | POA: Diagnosis not present

## 2023-11-05 DIAGNOSIS — Z7901 Long term (current) use of anticoagulants: Secondary | ICD-10-CM | POA: Diagnosis not present

## 2023-11-05 DIAGNOSIS — R1310 Dysphagia, unspecified: Secondary | ICD-10-CM | POA: Diagnosis not present

## 2023-11-05 DIAGNOSIS — N1831 Chronic kidney disease, stage 3a: Secondary | ICD-10-CM | POA: Diagnosis not present

## 2023-11-05 DIAGNOSIS — J069 Acute upper respiratory infection, unspecified: Secondary | ICD-10-CM | POA: Diagnosis not present

## 2023-11-05 DIAGNOSIS — E039 Hypothyroidism, unspecified: Secondary | ICD-10-CM | POA: Diagnosis not present

## 2023-11-05 DIAGNOSIS — Z9049 Acquired absence of other specified parts of digestive tract: Secondary | ICD-10-CM | POA: Diagnosis not present

## 2023-11-05 DIAGNOSIS — E782 Mixed hyperlipidemia: Secondary | ICD-10-CM | POA: Diagnosis not present

## 2023-11-05 DIAGNOSIS — Z556 Problems related to health literacy: Secondary | ICD-10-CM | POA: Diagnosis not present

## 2023-11-05 DIAGNOSIS — Z7982 Long term (current) use of aspirin: Secondary | ICD-10-CM | POA: Diagnosis not present

## 2023-11-05 DIAGNOSIS — I69354 Hemiplegia and hemiparesis following cerebral infarction affecting left non-dominant side: Secondary | ICD-10-CM | POA: Diagnosis not present

## 2023-11-05 DIAGNOSIS — U071 COVID-19: Secondary | ICD-10-CM | POA: Diagnosis not present

## 2023-11-05 DIAGNOSIS — Z48815 Encounter for surgical aftercare following surgery on the digestive system: Secondary | ICD-10-CM | POA: Diagnosis not present

## 2023-11-05 NOTE — Telephone Encounter (Signed)
DHB FORM    IS NEEDING TO BE FAXED BACK OVER TO HOME HEALTH FOR PT FAX NUMBER IS [phone]

## 2023-11-09 NOTE — Telephone Encounter (Signed)
Left voicemail , have not received any form for patient

## 2023-11-10 DIAGNOSIS — I129 Hypertensive chronic kidney disease with stage 1 through stage 4 chronic kidney disease, or unspecified chronic kidney disease: Secondary | ICD-10-CM | POA: Diagnosis not present

## 2023-11-10 DIAGNOSIS — E782 Mixed hyperlipidemia: Secondary | ICD-10-CM | POA: Diagnosis not present

## 2023-11-10 DIAGNOSIS — Z7901 Long term (current) use of anticoagulants: Secondary | ICD-10-CM | POA: Diagnosis not present

## 2023-11-10 DIAGNOSIS — J069 Acute upper respiratory infection, unspecified: Secondary | ICD-10-CM | POA: Diagnosis not present

## 2023-11-10 DIAGNOSIS — I82509 Chronic embolism and thrombosis of unspecified deep veins of unspecified lower extremity: Secondary | ICD-10-CM | POA: Diagnosis not present

## 2023-11-10 DIAGNOSIS — Z9049 Acquired absence of other specified parts of digestive tract: Secondary | ICD-10-CM | POA: Diagnosis not present

## 2023-11-10 DIAGNOSIS — Z7982 Long term (current) use of aspirin: Secondary | ICD-10-CM | POA: Diagnosis not present

## 2023-11-10 DIAGNOSIS — Z48815 Encounter for surgical aftercare following surgery on the digestive system: Secondary | ICD-10-CM | POA: Diagnosis not present

## 2023-11-10 DIAGNOSIS — N1831 Chronic kidney disease, stage 3a: Secondary | ICD-10-CM | POA: Diagnosis not present

## 2023-11-10 DIAGNOSIS — E039 Hypothyroidism, unspecified: Secondary | ICD-10-CM | POA: Diagnosis not present

## 2023-11-10 DIAGNOSIS — Z556 Problems related to health literacy: Secondary | ICD-10-CM | POA: Diagnosis not present

## 2023-11-10 DIAGNOSIS — U071 COVID-19: Secondary | ICD-10-CM | POA: Diagnosis not present

## 2023-11-10 DIAGNOSIS — R1310 Dysphagia, unspecified: Secondary | ICD-10-CM | POA: Diagnosis not present

## 2023-11-10 DIAGNOSIS — I69354 Hemiplegia and hemiparesis following cerebral infarction affecting left non-dominant side: Secondary | ICD-10-CM | POA: Diagnosis not present

## 2023-11-12 ENCOUNTER — Telehealth: Payer: Self-pay

## 2023-11-12 NOTE — Telephone Encounter (Signed)
Copied from CRM [id number]. Topic: General - Other >> Nov 12, 2023 11:57 AM Clayton Bibles wrote: Reason for CRM: Synetta Fail with [facility] called and is requesting a [facility] 1501 form to be completed so Matthew Wise can receive personal care services that his insurance will pay. Synetta Fail is faxing a [facility] 1501 form today to complete.

## 2023-11-12 NOTE — Telephone Encounter (Signed)
Unable to reach patient to see if he needs these services, have not received fax

## 2023-11-16 NOTE — Telephone Encounter (Signed)
Noted Copied Scanned  to provider s drive folder  Fax when completed to [phone] or [phone]

## 2023-11-17 DIAGNOSIS — Z7982 Long term (current) use of aspirin: Secondary | ICD-10-CM | POA: Diagnosis not present

## 2023-11-17 DIAGNOSIS — I69354 Hemiplegia and hemiparesis following cerebral infarction affecting left non-dominant side: Secondary | ICD-10-CM | POA: Diagnosis not present

## 2023-11-17 DIAGNOSIS — Z7901 Long term (current) use of anticoagulants: Secondary | ICD-10-CM | POA: Diagnosis not present

## 2023-11-17 DIAGNOSIS — E039 Hypothyroidism, unspecified: Secondary | ICD-10-CM | POA: Diagnosis not present

## 2023-11-17 DIAGNOSIS — Z9049 Acquired absence of other specified parts of digestive tract: Secondary | ICD-10-CM | POA: Diagnosis not present

## 2023-11-17 DIAGNOSIS — I82509 Chronic embolism and thrombosis of unspecified deep veins of unspecified lower extremity: Secondary | ICD-10-CM | POA: Diagnosis not present

## 2023-11-17 DIAGNOSIS — U071 COVID-19: Secondary | ICD-10-CM | POA: Diagnosis not present

## 2023-11-17 DIAGNOSIS — N1831 Chronic kidney disease, stage 3a: Secondary | ICD-10-CM | POA: Diagnosis not present

## 2023-11-17 DIAGNOSIS — J069 Acute upper respiratory infection, unspecified: Secondary | ICD-10-CM | POA: Diagnosis not present

## 2023-11-17 DIAGNOSIS — Z48815 Encounter for surgical aftercare following surgery on the digestive system: Secondary | ICD-10-CM | POA: Diagnosis not present

## 2023-11-17 DIAGNOSIS — R1310 Dysphagia, unspecified: Secondary | ICD-10-CM | POA: Diagnosis not present

## 2023-11-17 DIAGNOSIS — Z556 Problems related to health literacy: Secondary | ICD-10-CM | POA: Diagnosis not present

## 2023-11-17 DIAGNOSIS — E782 Mixed hyperlipidemia: Secondary | ICD-10-CM | POA: Diagnosis not present

## 2023-11-17 DIAGNOSIS — I129 Hypertensive chronic kidney disease with stage 1 through stage 4 chronic kidney disease, or unspecified chronic kidney disease: Secondary | ICD-10-CM | POA: Diagnosis not present

## 2023-11-22 DIAGNOSIS — Z48815 Encounter for surgical aftercare following surgery on the digestive system: Secondary | ICD-10-CM | POA: Diagnosis not present

## 2023-11-22 DIAGNOSIS — I129 Hypertensive chronic kidney disease with stage 1 through stage 4 chronic kidney disease, or unspecified chronic kidney disease: Secondary | ICD-10-CM | POA: Diagnosis not present

## 2023-11-22 DIAGNOSIS — E039 Hypothyroidism, unspecified: Secondary | ICD-10-CM | POA: Diagnosis not present

## 2023-11-22 DIAGNOSIS — N1831 Chronic kidney disease, stage 3a: Secondary | ICD-10-CM | POA: Diagnosis not present

## 2023-11-22 DIAGNOSIS — Z556 Problems related to health literacy: Secondary | ICD-10-CM | POA: Diagnosis not present

## 2023-11-22 DIAGNOSIS — R1310 Dysphagia, unspecified: Secondary | ICD-10-CM | POA: Diagnosis not present

## 2023-11-22 DIAGNOSIS — U071 COVID-19: Secondary | ICD-10-CM | POA: Diagnosis not present

## 2023-11-22 DIAGNOSIS — I82509 Chronic embolism and thrombosis of unspecified deep veins of unspecified lower extremity: Secondary | ICD-10-CM | POA: Diagnosis not present

## 2023-11-22 DIAGNOSIS — Z7982 Long term (current) use of aspirin: Secondary | ICD-10-CM | POA: Diagnosis not present

## 2023-11-22 DIAGNOSIS — Z9049 Acquired absence of other specified parts of digestive tract: Secondary | ICD-10-CM | POA: Diagnosis not present

## 2023-11-22 DIAGNOSIS — I69354 Hemiplegia and hemiparesis following cerebral infarction affecting left non-dominant side: Secondary | ICD-10-CM | POA: Diagnosis not present

## 2023-11-22 DIAGNOSIS — E782 Mixed hyperlipidemia: Secondary | ICD-10-CM | POA: Diagnosis not present

## 2023-11-22 DIAGNOSIS — J069 Acute upper respiratory infection, unspecified: Secondary | ICD-10-CM | POA: Diagnosis not present

## 2023-11-22 DIAGNOSIS — Z7901 Long term (current) use of anticoagulants: Secondary | ICD-10-CM | POA: Diagnosis not present

## 2023-11-29 DIAGNOSIS — U071 COVID-19: Secondary | ICD-10-CM | POA: Diagnosis not present

## 2023-11-29 DIAGNOSIS — Z556 Problems related to health literacy: Secondary | ICD-10-CM | POA: Diagnosis not present

## 2023-11-29 DIAGNOSIS — I129 Hypertensive chronic kidney disease with stage 1 through stage 4 chronic kidney disease, or unspecified chronic kidney disease: Secondary | ICD-10-CM | POA: Diagnosis not present

## 2023-11-29 DIAGNOSIS — Z48815 Encounter for surgical aftercare following surgery on the digestive system: Secondary | ICD-10-CM | POA: Diagnosis not present

## 2023-11-29 DIAGNOSIS — I82509 Chronic embolism and thrombosis of unspecified deep veins of unspecified lower extremity: Secondary | ICD-10-CM | POA: Diagnosis not present

## 2023-11-29 DIAGNOSIS — J069 Acute upper respiratory infection, unspecified: Secondary | ICD-10-CM | POA: Diagnosis not present

## 2023-11-29 DIAGNOSIS — E782 Mixed hyperlipidemia: Secondary | ICD-10-CM | POA: Diagnosis not present

## 2023-11-29 DIAGNOSIS — I69354 Hemiplegia and hemiparesis following cerebral infarction affecting left non-dominant side: Secondary | ICD-10-CM | POA: Diagnosis not present

## 2023-11-29 DIAGNOSIS — R1310 Dysphagia, unspecified: Secondary | ICD-10-CM | POA: Diagnosis not present

## 2023-11-29 DIAGNOSIS — Z9049 Acquired absence of other specified parts of digestive tract: Secondary | ICD-10-CM | POA: Diagnosis not present

## 2023-11-29 DIAGNOSIS — Z7901 Long term (current) use of anticoagulants: Secondary | ICD-10-CM | POA: Diagnosis not present

## 2023-11-29 DIAGNOSIS — E039 Hypothyroidism, unspecified: Secondary | ICD-10-CM | POA: Diagnosis not present

## 2023-11-29 DIAGNOSIS — Z7982 Long term (current) use of aspirin: Secondary | ICD-10-CM | POA: Diagnosis not present

## 2023-11-29 DIAGNOSIS — N1831 Chronic kidney disease, stage 3a: Secondary | ICD-10-CM | POA: Diagnosis not present

## 2023-12-02 NOTE — Telephone Encounter (Signed)
 Called patient to let him know need updated forms, will contact lady to resend the forms.

## 2023-12-06 DIAGNOSIS — N1831 Chronic kidney disease, stage 3a: Secondary | ICD-10-CM | POA: Diagnosis not present

## 2023-12-06 DIAGNOSIS — R809 Proteinuria, unspecified: Secondary | ICD-10-CM | POA: Diagnosis not present

## 2023-12-06 DIAGNOSIS — E559 Vitamin D deficiency, unspecified: Secondary | ICD-10-CM | POA: Diagnosis not present

## 2023-12-06 DIAGNOSIS — I129 Hypertensive chronic kidney disease with stage 1 through stage 4 chronic kidney disease, or unspecified chronic kidney disease: Secondary | ICD-10-CM | POA: Diagnosis not present

## 2023-12-08 DIAGNOSIS — Z556 Problems related to health literacy: Secondary | ICD-10-CM | POA: Diagnosis not present

## 2023-12-08 DIAGNOSIS — E039 Hypothyroidism, unspecified: Secondary | ICD-10-CM | POA: Diagnosis not present

## 2023-12-08 DIAGNOSIS — R1310 Dysphagia, unspecified: Secondary | ICD-10-CM | POA: Diagnosis not present

## 2023-12-08 DIAGNOSIS — I82509 Chronic embolism and thrombosis of unspecified deep veins of unspecified lower extremity: Secondary | ICD-10-CM | POA: Diagnosis not present

## 2023-12-08 DIAGNOSIS — I69354 Hemiplegia and hemiparesis following cerebral infarction affecting left non-dominant side: Secondary | ICD-10-CM | POA: Diagnosis not present

## 2023-12-08 DIAGNOSIS — I129 Hypertensive chronic kidney disease with stage 1 through stage 4 chronic kidney disease, or unspecified chronic kidney disease: Secondary | ICD-10-CM | POA: Diagnosis not present

## 2023-12-08 DIAGNOSIS — Z7901 Long term (current) use of anticoagulants: Secondary | ICD-10-CM | POA: Diagnosis not present

## 2023-12-08 DIAGNOSIS — Z48815 Encounter for surgical aftercare following surgery on the digestive system: Secondary | ICD-10-CM | POA: Diagnosis not present

## 2023-12-08 DIAGNOSIS — U071 COVID-19: Secondary | ICD-10-CM | POA: Diagnosis not present

## 2023-12-08 DIAGNOSIS — Z7982 Long term (current) use of aspirin: Secondary | ICD-10-CM | POA: Diagnosis not present

## 2023-12-08 DIAGNOSIS — E782 Mixed hyperlipidemia: Secondary | ICD-10-CM | POA: Diagnosis not present

## 2023-12-08 DIAGNOSIS — J069 Acute upper respiratory infection, unspecified: Secondary | ICD-10-CM | POA: Diagnosis not present

## 2023-12-08 DIAGNOSIS — Z9049 Acquired absence of other specified parts of digestive tract: Secondary | ICD-10-CM | POA: Diagnosis not present

## 2023-12-08 DIAGNOSIS — N1831 Chronic kidney disease, stage 3a: Secondary | ICD-10-CM | POA: Diagnosis not present

## 2023-12-13 ENCOUNTER — Other Ambulatory Visit: Payer: Self-pay | Admitting: Internal Medicine

## 2023-12-13 DIAGNOSIS — I10 Essential (primary) hypertension: Secondary | ICD-10-CM

## 2023-12-13 DIAGNOSIS — N1831 Chronic kidney disease, stage 3a: Secondary | ICD-10-CM

## 2023-12-14 ENCOUNTER — Telehealth: Payer: Self-pay | Admitting: Internal Medicine

## 2023-12-14 ENCOUNTER — Other Ambulatory Visit: Payer: Self-pay | Admitting: Internal Medicine

## 2023-12-14 DIAGNOSIS — N401 Enlarged prostate with lower urinary tract symptoms: Secondary | ICD-10-CM

## 2023-12-14 DIAGNOSIS — I10 Essential (primary) hypertension: Secondary | ICD-10-CM

## 2023-12-14 NOTE — Telephone Encounter (Signed)
 Pls see which home health he is requesting. If an aide, he must have medicaid.

## 2023-12-14 NOTE — Telephone Encounter (Signed)
 Copied from CRM [id number]. Topic: General - Inquiry >> Dec 14, 2023  3:13 PM Mosetta Putt H wrote: Reason for CRM: looking to get healthcare in home need to fill out forms

## 2023-12-15 ENCOUNTER — Telehealth: Payer: Self-pay | Admitting: Internal Medicine

## 2023-12-15 DIAGNOSIS — J069 Acute upper respiratory infection, unspecified: Secondary | ICD-10-CM | POA: Diagnosis not present

## 2023-12-15 DIAGNOSIS — Z48815 Encounter for surgical aftercare following surgery on the digestive system: Secondary | ICD-10-CM | POA: Diagnosis not present

## 2023-12-15 DIAGNOSIS — Z9049 Acquired absence of other specified parts of digestive tract: Secondary | ICD-10-CM | POA: Diagnosis not present

## 2023-12-15 DIAGNOSIS — R1310 Dysphagia, unspecified: Secondary | ICD-10-CM | POA: Diagnosis not present

## 2023-12-15 DIAGNOSIS — I69354 Hemiplegia and hemiparesis following cerebral infarction affecting left non-dominant side: Secondary | ICD-10-CM | POA: Diagnosis not present

## 2023-12-15 DIAGNOSIS — N1831 Chronic kidney disease, stage 3a: Secondary | ICD-10-CM | POA: Diagnosis not present

## 2023-12-15 DIAGNOSIS — Z7901 Long term (current) use of anticoagulants: Secondary | ICD-10-CM | POA: Diagnosis not present

## 2023-12-15 DIAGNOSIS — Z7982 Long term (current) use of aspirin: Secondary | ICD-10-CM | POA: Diagnosis not present

## 2023-12-15 DIAGNOSIS — I82509 Chronic embolism and thrombosis of unspecified deep veins of unspecified lower extremity: Secondary | ICD-10-CM | POA: Diagnosis not present

## 2023-12-15 DIAGNOSIS — E782 Mixed hyperlipidemia: Secondary | ICD-10-CM | POA: Diagnosis not present

## 2023-12-15 DIAGNOSIS — U071 COVID-19: Secondary | ICD-10-CM | POA: Diagnosis not present

## 2023-12-15 DIAGNOSIS — I129 Hypertensive chronic kidney disease with stage 1 through stage 4 chronic kidney disease, or unspecified chronic kidney disease: Secondary | ICD-10-CM | POA: Diagnosis not present

## 2023-12-15 DIAGNOSIS — Z556 Problems related to health literacy: Secondary | ICD-10-CM | POA: Diagnosis not present

## 2023-12-15 DIAGNOSIS — E039 Hypothyroidism, unspecified: Secondary | ICD-10-CM | POA: Diagnosis not present

## 2023-12-15 NOTE — Telephone Encounter (Signed)
 Spoke to pt states all is well.

## 2023-12-15 NOTE — Telephone Encounter (Signed)
 Pt states he spoke to [place] [facility] states she was going to fax forms to complete states they were sent in a month ago.   Phone [phone] Fax: [phone]

## 2023-12-15 NOTE — Telephone Encounter (Signed)
 Spoke with Almira Armour states she faxed updated form 2 weeks ago, advised for her to refax I dont see where it has been received on our end as of yet .

## 2023-12-15 NOTE — Telephone Encounter (Signed)
 Copied from CRM [id number]. Topic: General - Other >> Dec 15, 2023  3:01 PM Twila L wrote: Reason for CRM: Murlean Armour from PT called and stated patient fell last week Patient stts no injury

## 2023-12-21 ENCOUNTER — Ambulatory Visit (INDEPENDENT_AMBULATORY_CARE_PROVIDER_SITE_OTHER): Payer: 59

## 2023-12-21 VITALS — Ht 72.0 in | Wt 260.0 lb

## 2023-12-21 DIAGNOSIS — Z0100 Encounter for examination of eyes and vision without abnormal findings: Secondary | ICD-10-CM

## 2023-12-21 DIAGNOSIS — Z0000 Encounter for general adult medical examination without abnormal findings: Secondary | ICD-10-CM | POA: Diagnosis not present

## 2023-12-21 NOTE — Telephone Encounter (Signed)
Can nurse reach out to the patient and let him know not yet received no forms.  And also reach out to [place] at [facility] [phone].  below in message says must have medicaid he has [place] [facility].

## 2023-12-21 NOTE — Patient Instructions (Signed)
Matthew Wise , Thank you for taking time to come for your Medicare Wellness Visit. I appreciate your ongoing commitment to your health goals. Please review the following plan we discussed and let me know if I can assist you in the future.   Referrals/Orders/Follow-Ups/Clinician Recommendations:  Next Medicare Annual Wellness Visit:December 26, 2024 at 10:00 am virtual visit.    [x]  You have been referred to [facility] in [place] for an eye exam. If you have not heard from the office in 5-7 business days, please call the number below to schedule your first appointment.  [facility] [address], [place], [place] [address] Phone: [phone]   This is a list of the screening recommended for you and due dates:  Health Maintenance  Topic Date Due   Zoster (Shingles) Vaccine (1 of 2) Never done   Pneumococcal Vaccination (2 of 2 - PCV) 01/26/2017   Flu Shot  07/01/2023   COVID-19 Vaccine (3 - 2024-25 season) 08/01/2023   Screening for Lung Cancer  10/07/2024   DTaP/Tdap/Td vaccine (2 - Tdap) 11/30/2024   Medicare Annual Wellness Visit  12/20/2024   Colon Cancer Screening  10/28/2025   Hepatitis C Screening  Completed   HIV Screening  Completed   HPV Vaccine  Aged Out    Advanced directives: (Declined) Advance directive discussed with you today. Even though you declined this today, please call our office should you change your mind, and we can give you the proper paperwork for you to fill out.  Next Medicare Annual Wellness Visit scheduled for next year: yes  Preventive Care 33-39 Years Old, Male Preventive care refers to lifestyle choices and visits with your health care provider that can promote health and wellness. Preventive care visits are also called wellness exams. What can I expect for my preventive care visit? Counseling During your preventive care visit, your health care provider may ask about your: Medical history, including: Past medical problems. Family medical  history. Current health, including: Emotional well-being. Home life and relationship well-being. Sexual activity. Lifestyle, including: Alcohol, nicotine or tobacco, and drug use. Access to firearms. Diet, exercise, and sleep habits. Safety issues such as seatbelt and bike helmet use. Sunscreen use. Work and work Astronomer. Physical exam Your health care provider will check your: Height and weight. These may be used to calculate your BMI (body mass index). BMI is a measurement that tells if you are at a healthy weight. Waist circumference. This measures the distance around your waistline. This measurement also tells if you are at a healthy weight and may help predict your risk of certain diseases, such as type 2 diabetes and high blood pressure. Heart rate and blood pressure. Body temperature. Skin for abnormal spots. What immunizations do I need?  Vaccines are usually given at various ages, according to a schedule. Your health care provider will recommend vaccines for you based on your age, medical history, and lifestyle or other factors, such as travel or where you work. What tests do I need? Screening Your health care provider may recommend screening tests for certain conditions. This may include: Lipid and cholesterol levels. Diabetes screening. This is done by checking your blood sugar (glucose) after you have not eaten for a while (fasting). Hepatitis B test. Hepatitis C test. HIV (human immunodeficiency virus) test. STI (sexually transmitted infection) testing, if you are at risk. Lung cancer screening. Prostate cancer screening. Colorectal cancer screening. Talk with your health care provider about your test results, treatment options, and if  necessary, the need for more tests. Follow these instructions at home: Eating and drinking  Eat a diet that includes fresh fruits and vegetables, whole grains, lean protein, and low-fat dairy products. Take vitamin and mineral  supplements as recommended by your health care provider. Do not drink alcohol if your health care provider tells you not to drink. If you drink alcohol: Limit how much you have to 0-2 drinks a day. Know how much alcohol is in your drink. In the [place] one drink equals one 12 oz bottle of beer (355 mL), one 5 oz glass of wine (148 mL), or one 1 oz glass of hard liquor (44 mL). Lifestyle Brush your teeth every morning and night with fluoride toothpaste. Floss one time each day. Exercise for at least 30 minutes 5 or more days each week. Do not use any products that contain nicotine or tobacco. These products include cigarettes, chewing tobacco, and vaping devices, such as e-cigarettes. If you need help quitting, ask your health care provider. Do not use drugs. If you are sexually active, practice safe sex. Use a condom or other form of protection to prevent STIs. Take aspirin only as told by your health care provider. Make sure that you understand how much to take and what form to take. Work with your health care provider to find out whether it is safe and beneficial for you to take aspirin daily. Find healthy ways to manage stress, such as: Meditation, yoga, or listening to music. Journaling. Talking to a trusted person. Spending time with friends and family. Minimize exposure to UV radiation to reduce your risk of skin cancer. Safety Always wear your seat belt while driving or riding in a vehicle. Do not drive: If you have been drinking alcohol. Do not ride with someone who has been drinking. When you are tired or distracted. While texting. If you have been using any mind-altering substances or drugs. Wear a helmet and other protective equipment during sports activities. If you have firearms in your house, make sure you follow all gun safety procedures. What's next? Go to your health care provider once a year for an annual wellness visit. Ask your health care provider how often you should  have your eyes and teeth checked. Stay up to date on all vaccines. This information is not intended to replace advice given to you by your health care provider. Make sure you discuss any questions you have with your health care provider. Document Revised: 05/14/2021 Document Reviewed: 05/14/2021 Elsevier Patient Education  2024 [employer].  Understanding Your Risk for Falls Millions of people have serious injuries from falls each year. It is important to understand your risk of falling. Talk with your health care provider about your risk and what you can do to lower it. If you do have a serious fall, make sure to tell your provider. Falling once raises your risk of falling again. How can falls affect me? Serious injuries from falls are common. These include: Broken bones, such as hip fractures. Head injuries, such as traumatic brain injuries (TBI) or concussions. A fear of falling can cause you to avoid activities and stay at home. This can make your muscles weaker and raise your risk for a fall. What can increase my risk? There are a number of risk factors that increase your risk for falling. The more risk factors you have, the higher your risk of falling. Serious injuries from a fall happen most often to people who are older than 62 years old.  Teenagers and young adults ages 33-29 are also at higher risk. Common risk factors include: Weakness in the lower body. Being generally weak or confused due to long-term (chronic) illness. Dizziness or balance problems. Poor vision. Medicines that cause dizziness or drowsiness. These may include: Medicines for your blood pressure, heart, anxiety, insomnia, or swelling (edema). Pain medicines. Muscle relaxants. Other risk factors include: Drinking alcohol. Having had a fall in the past. Having foot pain or wearing improper footwear. Working at a dangerous job. Having any of the following in your home: Tripping hazards, such as floor clutter  or loose rugs. Poor lighting. Pets. Having dementia or memory loss. What actions can I take to lower my risk of falling?     Physical activity Stay physically fit. Do strength and balance exercises. Consider taking a regular class to build strength and balance. Yoga and tai chi are good options. Vision Have your eyes checked every year and your prescription for glasses or contacts updated as needed. Shoes and walking aids Wear non-skid shoes. Wear shoes that have rubber soles and low heels. Do not wear high heels. Do not walk around the house in socks or slippers. Use a cane or walker as told by your provider. Home safety Attach secure railings on both sides of your stairs. Install grab bars for your bathtub, shower, and toilet. Use a non-skid mat in your bathtub or shower. Attach bath mats securely with double-sided, non-slip rug tape. Use good lighting in all rooms. Keep a flashlight near your bed. Make sure there is a clear path from your bed to the bathroom. Use night-lights. Do not use throw rugs. Make sure all carpeting is taped or tacked down securely. Remove all clutter from walkways and stairways, including extension cords. Repair uneven or broken steps and floors. Avoid walking on icy or slippery surfaces. Walk on the grass instead of on icy or slick sidewalks. Use ice melter to get rid of ice on walkways in the winter. Use a cordless phone. Questions to ask your health care provider Can you help me check my risk for a fall? Do any of my medicines make me more likely to fall? Should I take a vitamin D supplement? What exercises can I do to improve my strength and balance? Should I make an appointment to have my vision checked? Do I need a bone density test to check for weak bones (osteoporosis)? Would it help to use a cane or a walker? Where to find more information Centers for Disease Control and Prevention, [employer]: TonerPromos.no Community-Based Fall Prevention Programs:  TonerPromos.no [employer] on Aging: BaseRingTones.pl Contact a health care provider if: You fall at home. You are afraid of falling at home. You feel weak, drowsy, or dizzy. This information is not intended to replace advice given to you by your health care provider. Make sure you discuss any questions you have with your health care provider. Document Revised: 07/20/2022 Document Reviewed: 07/20/2022 Elsevier Patient Education  2024 [employer].

## 2023-12-21 NOTE — Progress Notes (Signed)
Because this visit was a virtual/telehealth visit,  certain criteria was not obtained, such a blood pressure, CBG if applicable, and timed get up and go. Any medications not marked as "taking" were not mentioned during the medication reconciliation part of the visit. Any vitals not documented were not able to be obtained due to this being a telehealth visit or patient was unable to self-report a recent blood pressure reading due to a lack of equipment at home via telehealth. Vitals that have been documented are verbally provided by the patient.  Interactive audio and video telecommunications were attempted between this provider and patient, however failed, due to patient having technical difficulties OR patient did not have access to video capability.  We continued and completed visit with audio only.  Subjective:   Matthew Wise is a 63 y.o. male who presents for Medicare Annual/Subsequent preventive examination.  Visit Complete: Virtual I connected with  Matthew Wise on 12/21/23 by a audio enabled telemedicine application and verified that I am speaking with the correct person using two identifiers.  Patient Location: Home  Provider Location: Home Office  I discussed the limitations of evaluation and management by telemedicine. The patient expressed understanding and agreed to proceed.  Vital Signs: Because this visit was a virtual/telehealth visit, some criteria may be missing or patient reported. Any vitals not documented were not able to be obtained and vitals that have been documented are patient reported.  Patient Medicare AWV questionnaire was completed by the patient on na; I have confirmed that all information answered by patient is correct and no changes since this date.  Cardiac Risk Factors include: advanced age (>57men, >12 women);dyslipidemia;hypertension;male gender;obesity (BMI >30kg/m2);sedentary lifestyle;Other (see comment), Risk factor comments: CAD, CKD, HX of DVT and  PE     Objective:    Today's Vitals   12/21/23 1441  Weight: 260 lb (117.9 kg)  Height: 6' (1.829 m)   Body mass index is 35.26 kg/m.     12/21/2023    2:47 PM 10/08/2023    9:00 PM 10/08/2023   12:57 PM 10/15/2022    4:07 PM 04/24/2022    8:00 PM 04/24/2022    1:09 PM 02/19/2022   11:10 AM  Advanced Directives  Does Patient Have a Medical Advance Directive? No No No No No No No  Would patient like information on creating a medical advance directive? No - Patient declined No - Patient declined  No - Patient declined No - Patient declined No - Patient declined     Current Medications (verified) Outpatient Encounter Medications as of 12/21/2023  Medication Sig   aspirin EC 81 MG EC tablet Take 1 tablet (81 mg total) by mouth daily. Swallow whole.   atorvastatin (LIPITOR) 40 MG tablet Take 1 tablet by mouth once daily   levETIRAcetam (KEPPRA) 500 MG tablet Take 1 tablet (500 mg total) by mouth 2 (two) times daily.   levothyroxine (SYNTHROID) 25 MCG tablet Take 1 tablet by mouth once daily   lisinopril (ZESTRIL) 2.5 MG tablet Take 1 tablet by mouth once daily   Multiple Vitamin (MULTIVITAMIN) tablet Take 1 tablet by mouth daily.   pantoprazole (PROTONIX) 40 MG tablet Take 1 tablet (40 mg total) by mouth daily.   polyethylene glycol (MIRALAX / GLYCOLAX) 17 g packet Take 17 g by mouth daily.   potassium chloride (KLOR-CON) 20 MEQ packet Take 20 mEq by mouth daily. X 7 days   PRADAXA 150 MG CAPS capsule Take 1 capsule (150 mg total)  by mouth every 12 (twelve) hours.   senna-docusate (SENOKOT-S) 8.6-50 MG tablet Take 1 tablet by mouth at bedtime.   tamsulosin (FLOMAX) 0.4 MG CAPS capsule TAKE 1 CAPSULE BY MOUTH ONCE DAILY AFTER SUPPER   No facility-administered encounter medications on file as of 12/21/2023.    Allergies (verified) Patient has no known allergies.   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 two simple tubular adenomas removed. patient was advised to go [facility] for [facility] but he did not keep appt.   IR CT HEAD LTD  02/02/2021   IR PERCUTANEOUS ART THROMBECTOMY/INFUSION INTRACRANIAL INC DIAG ANGIO  02/02/2021   RADIOLOGY WITH ANESTHESIA N/A 02/02/2021   Procedure: IR WITH ANESTHESIA;  Surgeon: Julieanne Cotton, MD;  Location: [facility] OR;  Service: Radiology;  Laterality: N/A;   TEE WITHOUT CARDIOVERSION N/A 09/10/2020   Procedure: TRANSESOPHAGEAL ECHOCARDIOGRAM (TEE) WITH PROPOFOL;  Surgeon: Jonelle Sidle, MD;  Location: [facility] ORS;  Service: Cardiovascular;  Laterality: N/A;   XI ROBOTIC LAPAROSCOPIC ASSISTED APPENDECTOMY N/A 10/09/2023   Procedure: XI ROBOTIC LAPAROSCOPIC ASSISTED APPENDECTOMY;  Surgeon: Lewie Chamber, DO;  Location: [facility] ORS;  Service: General;  Laterality: N/A;   Family History  Problem Relation Age of Onset   COPD Father    Stroke Brother    Pulmonary embolism Sister     Throat cancer Brother    Arthritis Mother    Diabetes Mother    Stroke Mother    Hypertension Brother    Diabetes Brother    Arthritis Brother    Diabetes Sister    Colon cancer Neg Hx    Social History   Socioeconomic History   Marital status: Divorced    Spouse name: Not on file   Number of children: 2   Years of education: 12   Highest education level: Not on file  Occupational History   Occupation: disability    Comment: DVT  Tobacco Use   Smoking status: Former    Current packs/day: 1.00    Average packs/day: 1 pack/day for 40.1 years (40.1 ttl pk-yrs)    Types: Cigarettes    Start date: 12/01/1983   Smokeless tobacco: Never  Vaping Use   Vaping status: Never Used  Substance and Sexual Activity   Alcohol use: Not Currently    Alcohol/week: 3.0 standard drinks of alcohol    Types: 3 Cans of beer per week    Comment: has not drank sice 11/27/20  Drug use: Not Currently    Comment: REMOTE HISTORY, Quit in 2007   Sexual activity: Yes  Other Topics Concern   Not on file  Social History Narrative   Lives with sister   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   Right Handed    Drinks caffeine on occassion   Social Drivers of Health   Financial Resource Strain: Low Risk  (12/21/2023)   Overall Financial Resource Strain (CARDIA)    Difficulty of Paying Living Expenses: Not hard at all  Food Insecurity: No Food Insecurity (12/21/2023)   Hunger Vital Sign    Worried About Running Out of Food in the Last Year: Never true    Ran Out of Food in the Last Year: Never true  Transportation Needs: No Transportation Needs (12/21/2023)   PRAPARE - Administrator, Civil Service (Medical): No    Lack of Transportation (Non-Medical): No  Physical Activity: Insufficiently Active (12/21/2023)   Exercise Vital Sign    Days of Exercise per Week: 1 day    Minutes of Exercise per Session: 20 min  Stress: No Stress Concern Present (12/21/2023)   [employer] of  Occupational Health - Occupational Stress Questionnaire    Feeling of Stress : Not at all  Social Connections: Socially Isolated (12/21/2023)   Social Connection and Isolation Panel [NHANES]    Frequency of Communication with Friends and Family: More than three times a week    Frequency of Social Gatherings with Friends and Family: Three times a week    Attends Religious Services: Never    Active Member of Clubs or Organizations: No    Attends Engineer, structural: Never    Marital Status: Divorced    Tobacco Counseling Counseling given: Yes   Clinical Intake:  Pre-visit preparation completed: Yes  Pain : No/denies pain     BMI - recorded: 35.26 Nutritional Risks: None Diabetes: No  How often do you need to have someone help you when you read instructions, pamphlets, or other written materials from your doctor or pharmacy?: 1 - Never  Interpreter Needed?: No  Comments: pt is trying to get paperwork approved so that friend can get reimbursed for helping him with ADL's. states Dr. Allena Katz has faxed paperwork back. Information entered by :: Maryjean Ka CMA   Activities of Daily Living    12/21/2023    2:45 PM 10/08/2023    9:00 PM  In your present state of health, do you have any difficulty performing the following activities:  Hearing? 0 0  Vision? 0 0  Difficulty concentrating or making decisions? 0 0  Walking or climbing stairs? 1   Comment uses cane   Dressing or bathing? 1   Doing errands, shopping? 1 1  Preparing Food and eating ? Y   Comment family helps him   Using the Toilet? N   In the past six months, have you accidently leaked urine? N   Do you have problems with loss of bowel control? N   Managing your Medications? N   Managing your Finances? N   Housekeeping or managing your Housekeeping? N     Patient Care Team: Anabel Halon, MD as PCP - General (Internal Medicine) Lurena Nida, PTA as Physical Therapy Assistant (Physical Therapy) West Bali, MD (Inactive) as Consulting Physician (Gastroenterology) Lanelle Bal, DO as Consulting Physician (Internal Medicine)  Indicate any recent Medical Services you may have received from other than [facility] providers in the  past year (date may be approximate).     Assessment:   This is a routine wellness examination for [place].  Hearing/Vision screen Hearing Screening - Comments:: Patient denies any hearing difficulties.   Vision Screening - Comments:: Patient is not utd with yearly exams and doesn't have an eye doctor. Referral placed today. Patient in agreement with treatment plan.    Goals Addressed             This Visit's Progress    Patient Stated       Strengthen my arms and legs       Depression Screen    12/21/2023    2:52 PM 10/01/2023    3:45 PM 07/16/2023   10:53 AM 01/15/2023   11:41 AM 01/05/2023   10:14 AM 10/21/2022    2:57 PM 10/15/2022    4:07 PM  PHQ 2/9 Scores  PHQ - 2 Score 0 0 0 0 0 0 0  PHQ- 9 Score 0    0 0     Fall Risk    12/21/2023    2:47 PM 07/16/2023   10:53 AM 01/15/2023   11:41 AM 01/05/2023   10:14 AM 10/21/2022    2:57 PM  Fall Risk   Falls in the past year? 1 0 0 0 0  Number falls in past yr: 0 0 0 0 0  Injury with Fall? 0 0 0 0 0  Risk for fall due to : History of fall(s);Impaired balance/gait;Impaired mobility   No Fall Risks No Fall Risks  Follow up Education provided;Falls prevention discussed   Falls evaluation completed Falls evaluation completed    MEDICARE RISK AT HOME: Medicare Risk at Home Any stairs in or around the home?: No If so, are there any without handrails?: No Home free of loose throw rugs in walkways, pet beds, electrical cords, etc?: Yes Adequate lighting in your home to reduce risk of falls?: Yes Life alert?: No Use of a cane, walker or w/c?: Yes Grab bars in the bathroom?: No Shower chair or bench in shower?: No Elevated toilet seat or a handicapped toilet?: Yes  TIMED UP AND GO:  Was the test  performed?  No    Cognitive Function:    10/01/2021    3:49 PM  MMSE - Mini Mental State Exam  Not completed: Unable to complete        12/21/2023    2:49 PM 10/15/2022    4:08 PM 10/01/2021    3:49 PM 12/05/2019    1:25 PM  6CIT Screen  What Year? 0 points 0 points 0 points 0 points  What month? 0 points 0 points 0 points 0 points  What time? 0 points 0 points 0 points 0 points  Count back from 20 0 points 0 points 2 points 0 points  Months in reverse 0 points 0 points 0 points 2 points  Repeat phrase 2 points 0 points 0 points 0 points  Total Score 2 points 0 points 2 points 2 points    Immunizations Immunization History  Administered Date(s) Administered   Influenza,inj,Quad PF,6+ Mos 09/14/2017   Moderna Sars-Covid-2 Vaccination 02/29/2020   PFIZER(Purple Top)SARS-COV-2 Vaccination 03/29/2020   Td 11/30/2014    TDAP status: Up to date  Flu Vaccine status: Declined, Education has been provided regarding the importance of this vaccine but patient still declined. Advised may receive this vaccine at local pharmacy or Health Dept. Aware to provide a copy of the vaccination record if obtained from local  pharmacy or Health Dept. Verbalized acceptance and understanding.  Pneumococcal vaccine status: Due, Education has been provided regarding the importance of this vaccine. Advised may receive this vaccine at local pharmacy or Health Dept. Aware to provide a copy of the vaccination record if obtained from local pharmacy or Health Dept. Verbalized acceptance and understanding.  Covid-19 vaccine status: Completed vaccines  Qualifies for Shingles Vaccine? Yes   Zostavax completed No   Shingrix Completed?: No.    Education has been provided regarding the importance of this vaccine. Patient has been advised to call insurance company to determine out of pocket expense if they have not yet received this vaccine. Advised may also receive vaccine at local pharmacy or Health Dept. Verbalized  acceptance and understanding.  Screening Tests Health Maintenance  Topic Date Due   Pneumococcal Vaccine 4-6 Years old (1 of 2 - PCV) Never done   Zoster Vaccines- Shingrix (1 of 2) Never done   INFLUENZA VACCINE  07/01/2023   COVID-19 Vaccine (3 - 2024-25 season) 08/01/2023   Medicare Annual Wellness (AWV)  10/16/2023   Lung Cancer Screening  10/07/2024   DTaP/Tdap/Td (2 - Tdap) 11/30/2024   Colonoscopy  10/28/2025   Hepatitis C Screening  Completed   HIV Screening  Completed   HPV VACCINES  Aged Out    Health Maintenance  Health Maintenance Due  Topic Date Due   Pneumococcal Vaccine 14-3 Years old (1 of 2 - PCV) Never done   Zoster Vaccines- Shingrix (1 of 2) Never done   INFLUENZA VACCINE  07/01/2023   COVID-19 Vaccine (3 - 2024-25 season) 08/01/2023   Medicare Annual Wellness (AWV)  10/16/2023    Colorectal cancer screening: Type of screening: Colonoscopy. Completed 10/29/2015. Repeat every 10 years  Lung Cancer Screening: (Low Dose CT Chest recommended if Age 39-80 years, 20 pack-year currently smoking OR have quit w/in 15years.) does qualify.   Lung Cancer Screening Referral: pt's screening is up to date. Last screening was on 10/08/2023  Additional Screening:  Hepatitis C Screening: does not qualify; Completed   Vision Screening: Recommended annual ophthalmology exams for early detection of glaucoma and other disorders of the eye. Is the patient up to date with their annual eye exam?  No  Who is the provider or what is the name of the office in which the patient attends annual eye exams? Referral placed for patient today If pt is not established with a provider, would they like to be referred to a provider to establish care? Yes .   Dental Screening: Recommended annual dental exams for proper oral hygiene  Diabetic Foot Exam: na  Community Resource Referral / Chronic Care Management: CRR required this visit?  No   CCM required this visit?  No     Plan:      I have personally reviewed and noted the following in the patient's chart:   Medical and social history Use of alcohol, tobacco or illicit drugs  Current medications and supplements including opioid prescriptions. Patient is not currently taking opioid prescriptions. Functional ability and status Nutritional status Physical activity Advanced directives List of other physicians Hospitalizations, surgeries, and ER visits in previous 12 months Vitals Screenings to include cognitive, depression, and falls Referrals and appointments  In addition, I have reviewed and discussed with patient certain preventive protocols, quality metrics, and best practice recommendations. A written personalized care plan for preventive services as well as general preventive health recommendations were provided to patient.     Jordan Hawks Cande Mastropietro, CMA  12/21/2023   After Visit Summary: (Mail) Due to this being a telephonic visit, the after visit summary with patients personalized plan was offered to patient via mail   Nurse Notes: see routing comment

## 2023-12-21 NOTE — Telephone Encounter (Signed)
Spoke to [employer] they have attempted to fax forms 3x, provided her an email where she can send the forms to instead.

## 2023-12-23 DIAGNOSIS — E782 Mixed hyperlipidemia: Secondary | ICD-10-CM | POA: Diagnosis not present

## 2023-12-23 DIAGNOSIS — I129 Hypertensive chronic kidney disease with stage 1 through stage 4 chronic kidney disease, or unspecified chronic kidney disease: Secondary | ICD-10-CM | POA: Diagnosis not present

## 2023-12-23 DIAGNOSIS — U071 COVID-19: Secondary | ICD-10-CM | POA: Diagnosis not present

## 2023-12-23 DIAGNOSIS — N1831 Chronic kidney disease, stage 3a: Secondary | ICD-10-CM | POA: Diagnosis not present

## 2023-12-23 DIAGNOSIS — Z7982 Long term (current) use of aspirin: Secondary | ICD-10-CM | POA: Diagnosis not present

## 2023-12-23 DIAGNOSIS — I82509 Chronic embolism and thrombosis of unspecified deep veins of unspecified lower extremity: Secondary | ICD-10-CM | POA: Diagnosis not present

## 2023-12-23 DIAGNOSIS — Z556 Problems related to health literacy: Secondary | ICD-10-CM | POA: Diagnosis not present

## 2023-12-23 DIAGNOSIS — Z9049 Acquired absence of other specified parts of digestive tract: Secondary | ICD-10-CM | POA: Diagnosis not present

## 2023-12-23 DIAGNOSIS — J069 Acute upper respiratory infection, unspecified: Secondary | ICD-10-CM | POA: Diagnosis not present

## 2023-12-23 DIAGNOSIS — E039 Hypothyroidism, unspecified: Secondary | ICD-10-CM | POA: Diagnosis not present

## 2023-12-23 DIAGNOSIS — Z48815 Encounter for surgical aftercare following surgery on the digestive system: Secondary | ICD-10-CM | POA: Diagnosis not present

## 2023-12-23 DIAGNOSIS — I69354 Hemiplegia and hemiparesis following cerebral infarction affecting left non-dominant side: Secondary | ICD-10-CM | POA: Diagnosis not present

## 2023-12-23 DIAGNOSIS — R1310 Dysphagia, unspecified: Secondary | ICD-10-CM | POA: Diagnosis not present

## 2023-12-23 DIAGNOSIS — Z7901 Long term (current) use of anticoagulants: Secondary | ICD-10-CM | POA: Diagnosis not present

## 2023-12-24 ENCOUNTER — Telehealth: Payer: Self-pay | Admitting: Internal Medicine

## 2023-12-24 NOTE — Telephone Encounter (Signed)
Form has been printed set in you folder to sign.

## 2023-12-24 NOTE — Telephone Encounter (Signed)
PCS  Noted  Copied Sleeved  Original in [employer] folder Copy front desk folder

## 2023-12-27 NOTE — Telephone Encounter (Signed)
Form has been completed and faxed.

## 2023-12-28 DIAGNOSIS — R1310 Dysphagia, unspecified: Secondary | ICD-10-CM | POA: Diagnosis not present

## 2023-12-28 DIAGNOSIS — E039 Hypothyroidism, unspecified: Secondary | ICD-10-CM | POA: Diagnosis not present

## 2023-12-28 DIAGNOSIS — Z9049 Acquired absence of other specified parts of digestive tract: Secondary | ICD-10-CM | POA: Diagnosis not present

## 2023-12-28 DIAGNOSIS — Z556 Problems related to health literacy: Secondary | ICD-10-CM | POA: Diagnosis not present

## 2023-12-28 DIAGNOSIS — N1831 Chronic kidney disease, stage 3a: Secondary | ICD-10-CM | POA: Diagnosis not present

## 2023-12-28 DIAGNOSIS — I82509 Chronic embolism and thrombosis of unspecified deep veins of unspecified lower extremity: Secondary | ICD-10-CM | POA: Diagnosis not present

## 2023-12-28 DIAGNOSIS — I129 Hypertensive chronic kidney disease with stage 1 through stage 4 chronic kidney disease, or unspecified chronic kidney disease: Secondary | ICD-10-CM | POA: Diagnosis not present

## 2023-12-28 DIAGNOSIS — E782 Mixed hyperlipidemia: Secondary | ICD-10-CM | POA: Diagnosis not present

## 2023-12-28 DIAGNOSIS — Z48815 Encounter for surgical aftercare following surgery on the digestive system: Secondary | ICD-10-CM | POA: Diagnosis not present

## 2023-12-28 DIAGNOSIS — I69354 Hemiplegia and hemiparesis following cerebral infarction affecting left non-dominant side: Secondary | ICD-10-CM | POA: Diagnosis not present

## 2023-12-28 DIAGNOSIS — J069 Acute upper respiratory infection, unspecified: Secondary | ICD-10-CM | POA: Diagnosis not present

## 2023-12-28 DIAGNOSIS — Z7982 Long term (current) use of aspirin: Secondary | ICD-10-CM | POA: Diagnosis not present

## 2023-12-28 DIAGNOSIS — Z7901 Long term (current) use of anticoagulants: Secondary | ICD-10-CM | POA: Diagnosis not present

## 2023-12-28 DIAGNOSIS — U071 COVID-19: Secondary | ICD-10-CM | POA: Diagnosis not present

## 2024-01-10 NOTE — Telephone Encounter (Signed)
 Form has been faxed again succefully to [phone]

## 2024-01-10 NOTE — Telephone Encounter (Signed)
 Copied from CRM [id number]. Topic: Clinical - Home Health Verbal Orders >> Jan 07, 2024  3:16 PM Opal Bill wrote: Caller/Agency: Anita/[facility]  Callback Number: [phone], Fax# [phone] Service Requested: Physical Therapy, Skilled Nursing Frequency: Patient doesn't know the frequencies Any new concerns about the patient? Please follow up with Almira Armour asa p

## 2024-01-10 NOTE — Telephone Encounter (Signed)
 Copied from CRM [id number]. Topic: General - Other >> Jan 07, 2024  3:46 PM Blair Bumpers wrote: Reason for CRM: Almira Armour, with [facility], called stating they have not received the [facility] forms back that were sent over a few weeks ago. They are in need of them so they can send them back to the state. Please give Anita a call back so that she can verify & confirm that the forms have been received & sent back. CB #: [phone]. She also left the fax number to make sure the forms were faxed to the correct number, [phone].

## 2024-01-13 ENCOUNTER — Other Ambulatory Visit: Payer: Self-pay | Admitting: Internal Medicine

## 2024-01-13 DIAGNOSIS — I69354 Hemiplegia and hemiparesis following cerebral infarction affecting left non-dominant side: Secondary | ICD-10-CM

## 2024-01-13 DIAGNOSIS — N1831 Chronic kidney disease, stage 3a: Secondary | ICD-10-CM

## 2024-01-13 DIAGNOSIS — N401 Enlarged prostate with lower urinary tract symptoms: Secondary | ICD-10-CM

## 2024-01-13 DIAGNOSIS — I10 Essential (primary) hypertension: Secondary | ICD-10-CM

## 2024-01-13 DIAGNOSIS — R7303 Prediabetes: Secondary | ICD-10-CM

## 2024-01-13 DIAGNOSIS — G8110 Spastic hemiplegia affecting unspecified side: Secondary | ICD-10-CM

## 2024-01-13 DIAGNOSIS — E039 Hypothyroidism, unspecified: Secondary | ICD-10-CM

## 2024-01-17 ENCOUNTER — Encounter: Payer: Self-pay | Admitting: Internal Medicine

## 2024-01-17 ENCOUNTER — Ambulatory Visit (INDEPENDENT_AMBULATORY_CARE_PROVIDER_SITE_OTHER): Payer: 59 | Admitting: Internal Medicine

## 2024-01-17 VITALS — BP 122/78 | HR 79 | Ht 72.0 in | Wt 270.8 lb

## 2024-01-17 DIAGNOSIS — Z7901 Long term (current) use of anticoagulants: Secondary | ICD-10-CM | POA: Diagnosis not present

## 2024-01-17 DIAGNOSIS — E039 Hypothyroidism, unspecified: Secondary | ICD-10-CM | POA: Diagnosis not present

## 2024-01-17 DIAGNOSIS — K219 Gastro-esophageal reflux disease without esophagitis: Secondary | ICD-10-CM | POA: Diagnosis not present

## 2024-01-17 DIAGNOSIS — I10 Essential (primary) hypertension: Secondary | ICD-10-CM | POA: Diagnosis not present

## 2024-01-17 DIAGNOSIS — D573 Sickle-cell trait: Secondary | ICD-10-CM | POA: Diagnosis not present

## 2024-01-17 DIAGNOSIS — N1831 Chronic kidney disease, stage 3a: Secondary | ICD-10-CM | POA: Diagnosis not present

## 2024-01-17 DIAGNOSIS — G8110 Spastic hemiplegia affecting unspecified side: Secondary | ICD-10-CM

## 2024-01-17 DIAGNOSIS — R7303 Prediabetes: Secondary | ICD-10-CM | POA: Diagnosis not present

## 2024-01-17 DIAGNOSIS — Z1211 Encounter for screening for malignant neoplasm of colon: Secondary | ICD-10-CM

## 2024-01-17 DIAGNOSIS — E782 Mixed hyperlipidemia: Secondary | ICD-10-CM | POA: Diagnosis not present

## 2024-01-17 MED ORDER — AMLODIPINE BESYLATE 5 MG PO TABS: 5.0000 mg | ORAL_TABLET | Freq: Every day | ORAL | 3 refills | Status: DC

## 2024-01-17 MED ORDER — PANTOPRAZOLE SODIUM 40 MG PO TBEC: 40.0000 mg | DELAYED_RELEASE_TABLET | Freq: Every day | ORAL | 3 refills | Status: AC

## 2024-01-17 MED ORDER — ATORVASTATIN CALCIUM 40 MG PO TABS: 40.0000 mg | ORAL_TABLET | Freq: Every day | ORAL | 3 refills | Status: AC

## 2024-01-17 MED ORDER — PRADAXA 150 MG PO CAPS: 150.0000 mg | ORAL_CAPSULE | Freq: Two times a day (BID) | ORAL | 3 refills | Status: AC

## 2024-01-17 NOTE — Assessment & Plan Note (Addendum)
Last BMP showed GFR - 52 Proteinuria improving Followed by nephrology- Dr. Theador Hawthorne On ACEi Avoid nephrotoxic agents

## 2024-01-17 NOTE — Patient Instructions (Addendum)
Please continue to take medications as prescribed. ? ?Please continue to follow low salt diet and ambulate as tolerated. ?

## 2024-01-17 NOTE — Assessment & Plan Note (Signed)
CVA in 10/2020 and 01/2021, s/p thrombectomy Has residual left-sided hemiplegia and hemiparesis  Wheelchair-bound, ambulates short distances at home On aspirin, statin and Pradaxa 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 Has not seen cardiology in outpatient setting

## 2024-01-17 NOTE — Progress Notes (Unsigned)
Established Patient Office Visit  Subjective:  Patient ID: Matthew Wise, male    DOB: [birth year]  Age: 62 y.o. MRN: [id number]  CC:  Chief Complaint  Patient presents with   Care Management    6 month f/u    HPI [name] is a 63 y.o. 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  HTN: BP is well controlled now.  He is taking amlodipine 5 mg daily and lisinopril 2.5 mg daily.  He denies any headache, dizziness, chest pain or palpitations.  CKD: He currently denies any dysuria, hematuria, urinary hesitancy or resistance.  He has followed up with Dr. Wolfgang Phoenix.   H/o CVA: His weakness of the left UE and LE have improved since the last visit.  He is able to walk short distances in the home with the support.  He is dependent for most of his ADLs due to very limited movement of left UE and LE.  He would benefit from continuous personal care services.  He had laparoscopic appendectomy in 11/24.  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 two simple tubular adenomas removed. patient was advised to go to [facility] for colonoscopy but he did not keep appt.   IR CT HEAD LTD  02/02/2021   IR PERCUTANEOUS ART THROMBECTOMY/INFUSION INTRACRANIAL INC DIAG ANGIO  02/02/2021   RADIOLOGY WITH ANESTHESIA N/A 02/02/2021   Procedure: IR WITH ANESTHESIA;  Surgeon: Julieanne Cotton, MD;  Location: [facility] OR;  Service: Radiology;  Laterality: N/A;   TEE WITHOUT CARDIOVERSION N/A 09/10/2020   Procedure: TRANSESOPHAGEAL ECHOCARDIOGRAM (TEE) WITH PROPOFOL;  Surgeon: Jonelle Sidle, MD;  Location: [facility];  Service: Cardiovascular;  Laterality: N/A;   XI ROBOTIC LAPAROSCOPIC ASSISTED APPENDECTOMY N/A 10/09/2023   Procedure: XI ROBOTIC LAPAROSCOPIC ASSISTED APPENDECTOMY;  Surgeon: Lewie Chamber, DO;  Location: [facility];  Service: General;  Laterality: N/A;    Family History  Problem Relation Age of Onset   COPD Father    Stroke Brother    Pulmonary embolism Sister    Throat cancer Brother    Arthritis Mother    Diabetes Mother    Stroke Mother    Hypertension Brother    Diabetes Brother    Arthritis Brother    Diabetes Sister    Colon cancer  Neg Hx     Social History   Socioeconomic History   Marital status: Divorced    Spouse name: Not on file   Number of children: 2   Years of education: 12   Highest education level: Not on file  Occupational History   Occupation: disability    Comment: DVT  Tobacco Use   Smoking status: Former    Current packs/day: 1.00    Average packs/day: 1 pack/day for 40.1 years (40.1 ttl pk-yrs)    Types: Cigarettes    Start date: 12/01/1983   Smokeless tobacco: Never  Vaping Use   Vaping status: Never Used   Substance and Sexual Activity   Alcohol use: Not Currently    Alcohol/week: 3.0 standard drinks of alcohol    Types: 3 Cans of beer per week    Comment: has not drank sice 11/27/20   Drug use: Not Currently    Comment: REMOTE HISTORY, Quit in 2007   Sexual activity: Yes  Other Topics Concern   Not on file  Social History Narrative   Lives with sister   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   Right Handed    Drinks caffeine on occassion   Social Drivers of Health   Financial Resource Strain: Low Risk  (12/21/2023)   Overall Financial Resource Strain (CARDIA)    Difficulty of Paying Living Expenses: Not hard at all  Food Insecurity: No Food Insecurity (12/21/2023)   Hunger Vital Sign    Worried About Running Out of Food in the Last Year: Never true    Ran Out of Food in the Last Year: Never true  Transportation Needs: No Transportation Needs (12/21/2023)   PRAPARE - Administrator, Civil Service (Medical): No    Lack of Transportation (Non-Medical): No  Physical Activity: Insufficiently Active (12/21/2023)   Exercise Vital Sign    Days of Exercise per Week: 1 day    Minutes of Exercise per Session: 20 min  Stress: No Stress Concern Present (12/21/2023)   [employer] of Occupational Health - Occupational Stress Questionnaire    Feeling of Stress : Not at all  Social Connections: Socially Isolated (12/21/2023)   Social Connection and Isolation Panel [NHANES]    Frequency of Communication with Friends and Family: More than three times a week    Frequency of Social Gatherings with Friends and Family: Three times a week    Attends Religious Services: Never    Active Member of Clubs or Organizations: No    Attends Banker Meetings: Never    Marital Status: Divorced  Catering manager Violence: Not At Risk (12/21/2023)   Humiliation, Afraid, Rape, and Kick questionnaire    Fear of Current or Ex-Partner: No    Emotionally Abused: No    Physically  Abused: No    Sexually Abused: No    Outpatient Medications Prior to Visit  Medication Sig Dispense Refill   aspirin EC 81 MG EC tablet Take 1 tablet (81 mg total) by mouth daily. Swallow whole. 30 tablet 11   levETIRAcetam (KEPPRA) 500 MG tablet Take 1 tablet (500 mg total) by mouth 2 (two) times daily. 180 tablet 3   levothyroxine (SYNTHROID) 25 MCG tablet Take 1 tablet by mouth once daily 90 tablet 0   lisinopril (ZESTRIL) 2.5 MG tablet Take 1 tablet by mouth once daily 90 tablet 0   Multiple Vitamin (MULTIVITAMIN) tablet Take 1 tablet by mouth daily.     polyethylene glycol (MIRALAX /  GLYCOLAX) 17 g packet Take 17 g by mouth daily.     potassium chloride (KLOR-CON) 20 MEQ packet Take 20 mEq by mouth daily. X 7 days     senna-docusate (SENOKOT-S) 8.6-50 MG tablet Take 1 tablet by mouth at bedtime.     tamsulosin (FLOMAX) 0.4 MG CAPS capsule TAKE 1 CAPSULE BY MOUTH ONCE DAILY AFTER SUPPER 90 capsule 0   amLODipine (NORVASC) 5 MG tablet Take 5 mg by mouth daily.     atorvastatin (LIPITOR) 40 MG tablet Take 1 tablet by mouth once daily 90 tablet 0   pantoprazole (PROTONIX) 40 MG tablet Take 1 tablet (40 mg total) by mouth daily. 90 tablet 3   PRADAXA 150 MG CAPS capsule Take 1 capsule (150 mg total) by mouth every 12 (twelve) hours. 180 capsule 3   No facility-administered medications prior to visit.    No Known Allergies  ROS Review of Systems  Constitutional:  Positive for fatigue. Negative for chills and fever.  HENT:  Negative for congestion, postnasal drip, rhinorrhea and sore throat.   Eyes:  Negative for pain and discharge.  Respiratory:  Negative for cough and shortness of breath.   Cardiovascular:  Negative for chest pain and palpitations.  Gastrointestinal:  Positive for constipation. Negative for diarrhea, nausea and vomiting.  Endocrine: Negative for polydipsia and polyuria.  Genitourinary:  Negative for dysuria and hematuria.  Musculoskeletal:  Positive for arthralgias  and neck stiffness. Negative for neck pain.  Skin:  Negative for rash.  Neurological:  Positive for weakness and numbness. Negative for dizziness and headaches.  Psychiatric/Behavioral:  Negative for agitation and behavioral problems.       Objective:    Physical Exam Vitals reviewed.  Constitutional:      General: He is not in acute distress.    Appearance: He is not diaphoretic.     Comments: In wheelchair  HENT:     Head: Normocephalic and atraumatic.     Nose: No congestion.     Mouth/Throat:     Mouth: Mucous membranes are moist.  Eyes:     General: No scleral icterus.    Extraocular Movements: Extraocular movements intact.  Cardiovascular:     Rate and Rhythm: Normal rate and regular rhythm.     Pulses: Normal pulses.     Heart sounds: Normal heart sounds. No murmur heard. Pulmonary:     Breath sounds: Normal breath sounds. No wheezing or rales.  Musculoskeletal:     Cervical back: Neck supple. No tenderness.     Right lower leg: No edema.     Left lower leg: No edema.  Skin:    General: Skin is warm.     Findings: No rash.  Neurological:     General: No focal deficit present.     Mental Status: He is alert and oriented to person, place, and time.     Sensory: Sensory deficit (Left sided hemiparesis) present.     Motor: Weakness (Left UE and LE 2/5) present.  Psychiatric:        Mood and Affect: Mood normal.        Behavior: Behavior normal.     BP 122/78   Pulse 79   Ht 6' (1.829 m)   Wt 270 lb 12.8 oz (122.8 kg)   SpO2 99%   BMI 36.73 kg/m  Wt Readings from Last 3 Encounters:  01/17/24 270 lb 12.8 oz (122.8 kg)  12/21/23 260 lb (117.9 kg)  11/04/23 251 lb (113.9 kg)  Lab Results  Component Value Date   TSH 4.210 01/17/2024   Lab Results  Component Value Date   WBC 3.5 01/17/2024   HGB 13.0 01/17/2024   HCT 40.8 01/17/2024   MCV 84 01/17/2024   PLT 196 01/17/2024   Lab Results  Component Value Date   NA 141 01/17/2024   K 4.1  01/17/2024   CO2 25 01/17/2024   GLUCOSE 84 01/17/2024   BUN 9 01/17/2024   CREATININE 1.33 (H) 01/17/2024   BILITOT 0.5 01/17/2024   ALKPHOS 149 (H) 01/17/2024   AST 20 01/17/2024   ALT 28 01/17/2024   PROT 6.7 01/17/2024   ALBUMIN 3.9 01/17/2024   CALCIUM 9.0 01/17/2024   ANIONGAP 9 10/15/2023   EGFR 61 01/17/2024   Lab Results  Component Value Date   CHOL 130 01/17/2024   Lab Results  Component Value Date   HDL 57 01/17/2024   Lab Results  Component Value Date   LDLCALC 61 01/17/2024   Lab Results  Component Value Date   TRIG 52 01/17/2024   Lab Results  Component Value Date   CHOLHDL 2.3 01/17/2024   Lab Results  Component Value Date   HGBA1C 5.8 (H) 01/17/2024      Assessment & Plan:   Problem List Items Addressed This Visit       Cardiovascular and Mediastinum   Essential hypertension - Primary   BP Readings from Last 1 Encounters:  01/17/24 122/78   Well-controlled with amlodipine 5 mg QD and lisinopril 2.5 mg QD Counseled for compliance with the medications Advised DASH diet      Relevant Medications   amLODipine (NORVASC) 5 MG tablet   atorvastatin (LIPITOR) 40 MG tablet   PRADAXA 150 MG CAPS capsule     Digestive   Gastroesophageal reflux disease   Well-controlled with Pantoprazole      Relevant Medications   pantoprazole (PROTONIX) 40 MG tablet     Endocrine   Hypothyroidism   Relevant Orders   TSH + free T4 (Completed)     Nervous and Auditory   Spastic hemiplegia affecting nondominant side (HCC)   CVA in 10/2020 and 01/2021, s/p thrombectomy Has residual left-sided hemiplegia and hemiparesis  Wheelchair-bound, ambulates short distances at home On aspirin, statin and Pradaxa 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 Wolfgang Phoenix On ACEi Avoid nephrotoxic agents Check CMP      Relevant Medications   atorvastatin (LIPITOR) 40 MG tablet   Other Relevant Orders   VITAMIN D 25 Hydroxy (Vit-D Deficiency, Fractures) (Completed)   CMP14+EGFR (Completed)   CBC with Differential/Platelet (Completed)     Other   Sickle cell trait (HCC)   Has borderline low Hb in the past He has CKD and also takes Pradaxa for history of DVT/PE and cryptogenic stroke Denies any active bleeding Check CBC      Prediabetes   Lab Results  Component Value Date   HGBA1C 5.8 (H) 01/17/2024   Advised to follow low carb diet for now      Relevant Medications   atorvastatin (LIPITOR) 40 MG tablet   Other Relevant Orders   Hemoglobin A1c (Completed)   Mixed hyperlipidemia   On statin Check lipid profile  Relevant Medications   amLODipine (NORVASC) 5 MG tablet   atorvastatin (LIPITOR) 40 MG tablet   PRADAXA 150 MG CAPS capsule   Other Relevant Orders   Lipid panel (Completed)   Other Visit Diagnoses       Chronic anticoagulation       Relevant Medications   atorvastatin (LIPITOR) 40 MG tablet   PRADAXA 150 MG CAPS capsule         Follow-up: Return in about 6 months (around 07/16/2024).    Anabel Halon, MD

## 2024-01-17 NOTE — Assessment & Plan Note (Signed)
BP Readings from Last 1 Encounters:  01/17/24 122/78   Well-controlled with amlodipine 5 mg QD and lisinopril 2.5 mg QD Counseled for compliance with the medications Advised DASH diet

## 2024-01-17 NOTE — Assessment & Plan Note (Signed)
Has borderline low Hb in the past He has CKD and also takes Pradaxa for history of DVT/PE and cryptogenic stroke Denies any active bleeding Check CBC

## 2024-01-18 LAB — CMP14+EGFR
ALT: 28 IU/L (ref 0–44)
AST: 20 IU/L (ref 0–40)
Albumin: 3.9 g/dL (ref 3.9–4.9)
Alkaline Phosphatase: 149 IU/L — ABNORMAL HIGH (ref 44–121)
BUN/Creatinine Ratio: 7 — ABNORMAL LOW (ref 10–24)
BUN: 9 mg/dL (ref 8–27)
Bilirubin Total: 0.5 mg/dL (ref 0.0–1.2)
CO2: 25 mmol/L (ref 20–29)
Calcium: 9 mg/dL (ref 8.6–10.2)
Chloride: 105 mmol/L (ref 96–106)
Creatinine, Ser: 1.33 mg/dL — ABNORMAL HIGH (ref 0.76–1.27)
Globulin, Total: 2.8 g/dL (ref 1.5–4.5)
Glucose: 84 mg/dL (ref 70–99)
Potassium: 4.1 mmol/L (ref 3.5–5.2)
Sodium: 141 mmol/L (ref 134–144)
Total Protein: 6.7 g/dL (ref 6.0–8.5)
eGFR: 61 mL/min/1.73

## 2024-01-18 LAB — LIPID PANEL
Chol/HDL Ratio: 2.3 ratio (ref 0.0–5.0)
Cholesterol, Total: 130 mg/dL (ref 100–199)
HDL: 57 mg/dL
LDL Chol Calc (NIH): 61 mg/dL (ref 0–99)
Triglycerides: 52 mg/dL (ref 0–149)
VLDL Cholesterol Cal: 12 mg/dL (ref 5–40)

## 2024-01-18 LAB — CBC WITH DIFFERENTIAL/PLATELET
Basophils Absolute: 0 x10E3/uL (ref 0.0–0.2)
Basos: 1 %
EOS (ABSOLUTE): 0 x10E3/uL (ref 0.0–0.4)
Eos: 1 %
Hematocrit: 40.8 % (ref 37.5–51.0)
Hemoglobin: 13 g/dL (ref 13.0–17.7)
Immature Grans (Abs): 0 x10E3/uL (ref 0.0–0.1)
Immature Granulocytes: 0 %
Lymphocytes Absolute: 1.3 x10E3/uL (ref 0.7–3.1)
Lymphs: 37 %
MCH: 26.7 pg (ref 26.6–33.0)
MCHC: 31.9 g/dL (ref 31.5–35.7)
MCV: 84 fL (ref 79–97)
Monocytes Absolute: 0.3 x10E3/uL (ref 0.1–0.9)
Monocytes: 7 %
Neutrophils Absolute: 1.9 x10E3/uL (ref 1.4–7.0)
Neutrophils: 54 %
Platelets: 196 x10E3/uL (ref 150–450)
RBC: 4.86 x10E6/uL (ref 4.14–5.80)
RDW: 13.9 % (ref 11.6–15.4)
WBC: 3.5 x10E3/uL (ref 3.4–10.8)

## 2024-01-18 LAB — TSH+FREE T4
Free T4: 1.42 ng/dL (ref 0.82–1.77)
TSH: 4.21 u[IU]/mL (ref 0.450–4.500)

## 2024-01-18 LAB — VITAMIN D 25 HYDROXY (VIT D DEFICIENCY, FRACTURES): Vit D, 25-Hydroxy: 49.6 ng/mL (ref 30.0–100.0)

## 2024-01-18 LAB — HEMOGLOBIN A1C
Est. average glucose Bld gHb Est-mCnc: 120 mg/dL
Hgb A1c MFr Bld: 5.8 % — ABNORMAL HIGH (ref 4.8–5.6)

## 2024-01-21 NOTE — Assessment & Plan Note (Signed)
Lab Results  Component Value Date   HGBA1C 5.8 (H) 01/17/2024   Advised to follow low carb diet for now

## 2024-01-21 NOTE — Assessment & Plan Note (Signed)
 On statin Check lipid profile

## 2024-01-21 NOTE — Assessment & Plan Note (Signed)
Well-controlled with Pantoprazole 

## 2024-02-07 ENCOUNTER — Telehealth: Payer: Self-pay | Admitting: Internal Medicine

## 2024-02-07 ENCOUNTER — Other Ambulatory Visit: Payer: Self-pay | Admitting: Internal Medicine

## 2024-02-07 DIAGNOSIS — G8110 Spastic hemiplegia affecting unspecified side: Secondary | ICD-10-CM

## 2024-02-07 NOTE — Telephone Encounter (Unsigned)
 Copied from CRM [id number]. Topic: Referral - Request for Referral >> Feb 07, 2024  8:47 AM Gildardo Pounds wrote: Did the patient discuss referral with their provider in the last year? Yes (If No - schedule appointment) (If Yes - send message)  Appointment offered? No  Type of order/referral and detailed reason for visit: Physical Therapy  Preference of office, provider, location: Located on [address]  If referral order, have you been seen by this specialty before? Yes (If Yes, this issue or another issue? When? Where? Patient had in-house therapy at home  Can we respond through MyChart? No

## 2024-02-07 NOTE — Telephone Encounter (Signed)
 Contacted pt to inform. No answer, lvm w/ info.

## 2024-02-27 ENCOUNTER — Other Ambulatory Visit: Payer: Self-pay | Admitting: Internal Medicine

## 2024-02-27 DIAGNOSIS — E038 Other specified hypothyroidism: Secondary | ICD-10-CM

## 2024-03-01 ENCOUNTER — Ambulatory Visit (HOSPITAL_COMMUNITY)

## 2024-03-02 ENCOUNTER — Ambulatory Visit (HOSPITAL_COMMUNITY): Attending: Internal Medicine

## 2024-03-02 ENCOUNTER — Other Ambulatory Visit: Payer: Self-pay

## 2024-03-02 DIAGNOSIS — M6281 Muscle weakness (generalized): Secondary | ICD-10-CM | POA: Insufficient documentation

## 2024-03-02 DIAGNOSIS — G8114 Spastic hemiplegia affecting left nondominant side: Secondary | ICD-10-CM | POA: Diagnosis not present

## 2024-03-02 DIAGNOSIS — G8110 Spastic hemiplegia affecting unspecified side: Secondary | ICD-10-CM | POA: Insufficient documentation

## 2024-03-02 DIAGNOSIS — Z7409 Other reduced mobility: Secondary | ICD-10-CM | POA: Diagnosis not present

## 2024-03-02 NOTE — Therapy (Signed)
 OUTPATIENT PHYSICAL THERAPY LOWER EXTREMITY EVALUATION   Patient Name: Matthew Wise MRN: [id number] DOB:1961/09/12, 63 y.o., male Today's Date: 03/02/2024  END OF SESSION:  PT End of Session - 03/02/24 1203     Visit Number 1    Number of Visits 12    Date for PT Re-Evaluation 04/13/24    Authorization Type [employer] Medicare Dual Complete    Authorization Time Period No auth needed    Progress Note Due on Visit 10    PT Start Time 1156    PT Stop Time 1230    PT Time Calculation (min) 34 min    Equipment Utilized During Treatment Gait belt    Activity Tolerance Patient tolerated treatment well    Behavior During Therapy WFL for tasks assessed/performed;Flat affect             Past Medical History:  Diagnosis Date   Acute CVA (cerebrovascular accident) (HCC) 09/09/2020   Chronic anticoagulation 11/14/2014   CKD (chronic kidney disease) stage 3, GFR 30-59 ml/min (HCC) 10/24/2019   CKD (chronic kidney disease) stage 3, GFR 30-59 ml/min (HCC) 10/24/2019   Clotting disorder (HCC)    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 two simple tubular adenomas removed. patient was advised to go to [facility] for colonoscopy but he did not keep appt.    IR CT HEAD LTD  02/02/2021   IR PERCUTANEOUS ART THROMBECTOMY/INFUSION INTRACRANIAL INC DIAG ANGIO  02/02/2021   RADIOLOGY WITH ANESTHESIA N/A 02/02/2021   Procedure: IR WITH ANESTHESIA;  Surgeon: Julieanne Cotton, MD;  Location: [facility] OR;  Service: Radiology;  Laterality: N/A;   TEE WITHOUT CARDIOVERSION N/A 09/10/2020   Procedure: TRANSESOPHAGEAL ECHOCARDIOGRAM (TEE) WITH PROPOFOL;  Surgeon: Jonelle Sidle, MD;  Location: [facility];  Service: Cardiovascular;  Laterality: N/A;   XI ROBOTIC LAPAROSCOPIC ASSISTED APPENDECTOMY N/A 10/09/2023   Procedure: XI ROBOTIC LAPAROSCOPIC ASSISTED APPENDECTOMY;  Surgeon: Lewie Chamber, DO;  Location: [facility];  Service: General;  Laterality: N/A;   Patient Active Problem List   Diagnosis Date Noted   Postoperative ileus (HCC) 10/13/2023   Acute pancreatitis 10/08/2023   Skin abscess 10/01/2023   Acute non-recurrent sinusitis 01/15/2023   Gastroesophageal reflux disease 01/15/2023   Seizure with onset of stroke (HCC) 01/15/2023   Encounter for general adult medical examination with abnormal findings 07/06/2022   Stage 3a chronic kidney disease (HCC) 07/06/2022   Hospital discharge follow-up 04/30/2022   Obesity, Class I, BMI 30-34.9 04/25/2022   Generalized weakness    Near syncope    Non-seasonal allergic rhinitis 03/25/2022   Encounter for examination following treatment at hospital 02/27/2022   BPH (benign prostatic hyperplasia) 09/24/2021   History of CVA with residual deficit 09/24/2021   Dysphagia, post-stroke    Spastic  hemiplegia affecting nondominant side (HCC)    Hemiparesis affecting left side as late effect of stroke (HCC)    Thyroid nodule 01/25/2021   Hypothyroidism 01/25/2021   H/O adenomatous polyp of colon 12/30/2020   Essential hypertension 11/28/2020   Mixed hyperlipidemia 11/28/2020   H/O medication noncompliance 09/07/2020   Nicotine dependence 12/05/2019   Prediabetes 10/24/2019   Tubular adenoma of colon 09/14/2017    Long term (current) use of anticoagulants 11/14/2014   Sickle cell trait (HCC) 08/29/2012   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    PCP: Anabel Halon, MD  REFERRING PROVIDER: Anabel Halon, MD  REFERRING DIAG: G81.10 (ICD-10-CM) - Spastic hemiplegia affecting nondominant side (HCC)  THERAPY DIAG:  Muscle weakness (generalized) - Plan: PT plan of care cert/re-cert  Spastic hemiplegia affecting left nondominant side, unspecified etiology (HCC) - Plan: PT plan of care cert/re-cert  Impaired functional mobility, balance, gait, and endurance - Plan: PT plan of care cert/re-cert  Rationale for Evaluation and Treatment: Rehabilitation  ONSET DATE: 2022 Rehab, at home health, 2023, home health    SUBJECTIVE:   SUBJECTIVE STATEMENT: Patient reports history of CVA in 2022. Reports he received rehab in hospital, then home health, then outpatient PT here. After discharge from here, reports he got more home health PT. Reports he's still walking around home. Reports he's back  because he feels he needs more. Has an Aid that lives with him. Reports mod assist from her for ADLs. Max A for iADLs. LBQC use and w/c at home. Doesn't use w/c. Sometimes takes it to Dr visits.   PERTINENT HISTORY: CVA in 2022 Recurrent Wounds Clotting disorder DVT/PE PAIN:  Are you having pain? No  PRECAUTIONS: None  RED FLAGS: Bowel or bladder incontinence: No   WEIGHT BEARING RESTRICTIONS: No  FALLS:  Has patient fallen in last 6 months? No  LIVING ENVIRONMENT: Lives with: lives with their family Health care aid and daughter sometimes visits Lives in: House/apartment Stairs: No Has following equipment at home: Chiropractor, Wheelchair (manual), and Ramped entry  OCCUPATION: N/A. Disability  PLOF: Independent prior to CVA  PATIENT GOALS: Like to get strength back in Leg, and use of L arm  and do more for himself   NEXT MD VISIT: Posey Rea, may be in June  OBJECTIVE:   Note: Objective measures were completed at Evaluation unless otherwise noted.  PATIENT SURVEYS:  ABC scale    COGNITION: Overall cognitive status: Within functional limits for tasks assessed     SENSATION: Light touch: Impaired   Dec sensation t/o LLE, all dermatome levels.     Mild impairment in LUE  POSTURE: rounded shoulders, decreased lumbar lordosis, increased thoracic kyphosis, and flexed trunk   PALPATION: NT  LOWER EXTREMITY ROM:  Active ROM Right eval Left eval  Hip flexion    Hip extension    Hip abduction    Hip adduction    Hip internal rotation    Hip external rotation    Knee flexion    Knee extension    Ankle dorsiflexion    Ankle plantarflexion    Ankle inversion    Ankle eversion     (Blank rows = not tested)  LEFT UPPER EXTREMITY: Modified Ashworth level 2 2 (2) - More marked increase in tone but limb easily flexed.  RUE: shoulder flexion ROM WFL, 4+/5 MMT Minimal activation of L hand musculature, pt unable to open/close voluntarily.  LOWER EXTREMITY MMT:  MMT Right eval Left eval  Hip flexion 4- 3  Hip extension    Hip abduction    Hip adduction    Hip internal rotation    Hip external rotation    Knee flexion 4- 3  Knee extension 4- 3  Ankle dorsiflexion    Ankle plantarflexion    Ankle inversion    Ankle eversion     (Blank rows = not tested)   FUNCTIONAL TESTS:  5 times sit to stand: 37 Sec Timed up and go (TUG): 1 min 13 sec  All w/ use of armrests, and LBQA for stability once standing, CGA for safety  GAIT: Distance walked: 75 Assistive device utilized: Quad cane large base Level of assistance: Modified independence and SBA Comments: Pt ambulates with LBQC w/ extremely dec velocity, dec arm swing with LUE (shoulder abd, IR, elbow flexed, pronated, wrist flexed fingers flexed), forward lean, rounded shoulders, dec trunk rotation, step to pattern, dec knee flexion in LLE, dec heel strike in L foot, dec step lengths bilat,  dec hip ext bilat.                                                                                                                                TREATMENT DATE:  03/02/24:  PT Eval and HEP    PATIENT EDUCATION:  Education details: PT evaluation, objective findings, POC, Importance of HEP, Precautions, Clinic policies  Person educated: Patient Education method: Explanation and Demonstration Education comprehension: verbalized understanding and returned demonstration  HOME EXERCISE PROGRAM: Access Code: [email or web address] URL: [email or web address] Date: 03/02/2024 Prepared by: Fabiola Backer Powell-Butler  Exercises - Seated Shoulder Flexion AAROM with Hemiparetic UE  - 2 x daily - 7 x weekly - 2 sets - 10 reps - 5 hold - Side to Side Weight Shift with Counter Support  - 2 x daily - 7 x weekly - 2 sets - 10 reps - Sit to Stand with Counter Support  - 2 x daily - 7 x weekly - 2 sets - 10 reps  ASSESSMENT:  CLINICAL IMPRESSION: Patient is a 63 y.o. male who was seen today for physical therapy evaluation and treatment for G81.10 (ICD-10-CM) - Spastic hemiplegia affecting nondominant side (HCC). Patient with relevant history of CVA in 2022, affecting L side of body causing L sided hemiparesis. On this date, patient demonstrates decreased ROM of LUE, generalized weakness L>R, increased tone of L side, and impaired balance/coordination which may contribute to altered gait, decreased functional independence with ADLs/iADLs, and impaired endurance. Patient will benefit from skilled physical therapy in order to address the above to improve QOL and maximize  independence.   OBJECTIVE IMPAIRMENTS: Abnormal gait, decreased activity tolerance, decreased balance, decreased coordination, decreased endurance, decreased mobility, difficulty walking, decreased ROM, decreased strength, hypomobility, impaired sensation, impaired tone, impaired UE functional use, and improper body mechanics.   ACTIVITY  LIMITATIONS: carrying, lifting, bending, squatting, stairs, transfers, bed mobility, bathing, toileting, dressing, and hygiene/grooming  PARTICIPATION LIMITATIONS: cleaning, laundry, driving, shopping,  community activity, occupation, and yard work  PERSONAL FACTORS: Time since onset of injury/illness/exacerbation are also affecting patient's functional outcome.   REHAB POTENTIAL: Good  CLINICAL DECISION MAKING: Stable/uncomplicated  EVALUATION COMPLEXITY: Moderate   GOALS: Goals reviewed with patient? No  SHORT TERM GOALS: Target date: 03/16/24 Patient will be independent with performance of HEP to demonstrate adequate self management of symptoms.  Baseline:  Goal status: INITIAL  2.   Patient will report at least a 25% improvement with function or pain overall since beginning PT. Baseline:  Goal status: INITIAL   LONG TERM GOALS: Target date: 04/13/24  Patient will report at least a 50% improvement with function or pain overall since beginning PT. Baseline:  Goal status: INITIAL  2.  Patient will score at least a 4-/5 on LLE MMT to demonstrate improved LE strength for functional activities.  Baseline:  Goal status: INITIAL  3.  Patient will improve TUG score by at least 30 seconds to decrease fall risk.  Baseline:  Goal status: INITIAL  4.  Patient will improve 5xSTS score by at least 10 seconds to demonstrate improved LE power for functional activities. Baseline:  Goal status: INITIAL  PLAN:  PT FREQUENCY: 2x/week  PT DURATION: 6 weeks  PLANNED INTERVENTIONS: 97164- PT Re-evaluation, 97110-Therapeutic exercises, 97530- Therapeutic activity, 97112- Neuromuscular re-education, 97535- Self Care, [address]- Manual therapy, [phone]- Gait training, Patient/Family education, Balance training, and Joint mobilization  PLAN FOR NEXT SESSION: ABC Scale, Assess DF ROM and MMT. Progress WB activity of LUE/LLE, strengthening LLE/LUE    1:40 PM, 03/02/24 Aleira Deiter Powell-Butler, PT,  DPT [facility] [place]

## 2024-03-14 ENCOUNTER — Other Ambulatory Visit: Payer: Self-pay | Admitting: Internal Medicine

## 2024-03-14 DIAGNOSIS — N1831 Chronic kidney disease, stage 3a: Secondary | ICD-10-CM

## 2024-03-14 DIAGNOSIS — I10 Essential (primary) hypertension: Secondary | ICD-10-CM

## 2024-03-15 ENCOUNTER — Telehealth: Payer: Self-pay | Admitting: Pharmacy Technician

## 2024-03-15 ENCOUNTER — Other Ambulatory Visit (HOSPITAL_COMMUNITY): Payer: Self-pay

## 2024-03-15 ENCOUNTER — Telehealth: Payer: Self-pay | Admitting: Internal Medicine

## 2024-03-15 NOTE — Telephone Encounter (Signed)
 Please see note below: Routing for PA   Copied from CRM [id number]. Topic: Clinical - Prescription Issue >> Mar 15, 2024  8:44 AM Everlene Hobby D wrote: Needs Prior authorization- PRADAXA 150 MG CAPS capsule   [id number] - [place], [place] - [address] [place] [address] Phone: [phone] Fax: [phone] Hours: Not open 24 hours

## 2024-03-15 NOTE — Telephone Encounter (Signed)
 Pharmacy Patient Advocate Encounter   Received notification from Pt Calls Messages that prior authorization for Dabigatran Etexilate Mesylate 150MG  capsules is required/requested.   Insurance verification completed.   The patient is insured through [employer] .   Per test claim: PA required; PA submitted to above mentioned insurance via CoverMyMeds Key/confirmation #/EOC [id number] Status is pending

## 2024-03-15 NOTE — Telephone Encounter (Signed)
 Pharmacy Patient Advocate Encounter  Received notification from [facility] that Prior Authorization for Dabigatran Etexilate Mesylate 150MG  capsules has been APPROVED from 03/15/2024 to 11/29/2024. Unable to obtain price due to refill too soon rejection, last fill date 03/15/2024 next available fill date05/07/2024.   PA #/Case ID/Reference #: [id number]

## 2024-03-15 NOTE — Telephone Encounter (Signed)
 PA request has been Started. New Encounter has been or will be created for follow up. For additional info see Pharmacy Prior Auth telephone encounter from 03/15/2024.

## 2024-03-16 ENCOUNTER — Encounter (HOSPITAL_COMMUNITY): Payer: Self-pay

## 2024-03-16 ENCOUNTER — Ambulatory Visit (HOSPITAL_COMMUNITY)

## 2024-03-16 DIAGNOSIS — Z7409 Other reduced mobility: Secondary | ICD-10-CM

## 2024-03-16 DIAGNOSIS — G8110 Spastic hemiplegia affecting unspecified side: Secondary | ICD-10-CM | POA: Diagnosis not present

## 2024-03-16 DIAGNOSIS — M6281 Muscle weakness (generalized): Secondary | ICD-10-CM | POA: Diagnosis not present

## 2024-03-16 DIAGNOSIS — G8114 Spastic hemiplegia affecting left nondominant side: Secondary | ICD-10-CM

## 2024-03-16 NOTE — Therapy (Signed)
 OUTPATIENT PHYSICAL THERAPY LOWER EXTREMITY EVALUATION   Patient Name: Matthew Wise MRN: [id number] DOB:[birth year], 63 y.o., male Today's Date: 03/16/2024  END OF SESSION:  PT End of Session - 03/16/24 1530     Visit Number 2    Number of Visits 12    Date for PT Re-Evaluation 04/13/24    Authorization Type [employer] Medicare Dual Complete    Authorization Time Period No auth needed    Progress Note Due on Visit 10    PT Start Time 1530    PT Stop Time 1603    PT Time Calculation (min) 33 min    Equipment Utilized During Treatment Gait belt    Activity Tolerance Patient tolerated treatment well    Behavior During Therapy WFL for tasks assessed/performed;Flat affect             Past Medical History:  Diagnosis Date   Acute CVA (cerebrovascular accident) (HCC) 09/09/2020   Chronic anticoagulation 11/14/2014   CKD (chronic kidney disease) stage 3, GFR 30-59 ml/min (HCC) 10/24/2019   CKD (chronic kidney disease) stage 3, GFR 30-59 ml/min (HCC) 10/24/2019   Clotting disorder (HCC)    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 two simple tubular adenomas removed. patient was advised to go to [facility] for colonoscopy but he did not keep appt.    IR CT HEAD LTD  02/02/2021   IR PERCUTANEOUS ART THROMBECTOMY/INFUSION INTRACRANIAL INC DIAG ANGIO  02/02/2021   RADIOLOGY WITH ANESTHESIA N/A 02/02/2021   Procedure: IR WITH ANESTHESIA;  Surgeon: Luellen Sages, MD;  Location: [facility] OR;  Service: Radiology;  Laterality: N/A;   TEE WITHOUT CARDIOVERSION N/A 09/10/2020   Procedure: TRANSESOPHAGEAL ECHOCARDIOGRAM (TEE) WITH PROPOFOL;  Surgeon: Gerard Knight, MD;  Location: [facility];  Service: Cardiovascular;  Laterality: N/A;   XI ROBOTIC LAPAROSCOPIC ASSISTED APPENDECTOMY N/A 10/09/2023   Procedure: XI ROBOTIC LAPAROSCOPIC ASSISTED APPENDECTOMY;  Surgeon: Marijo Shove, DO;  Location: [facility];  Service: General;  Laterality: N/A;   Patient Active Problem List   Diagnosis Date Noted   Postoperative ileus (HCC) 10/13/2023   Acute pancreatitis 10/08/2023   Skin abscess 10/01/2023   Acute non-recurrent sinusitis 01/15/2023   Gastroesophageal reflux disease 01/15/2023   Seizure with onset of stroke (HCC) 01/15/2023   Encounter for general adult medical examination with abnormal findings 07/06/2022   Stage 3a chronic kidney disease (HCC) 07/06/2022   Hospital discharge follow-up 04/30/2022   Obesity, Class I, BMI 30-34.9 04/25/2022   Generalized weakness    Near syncope    Non-seasonal allergic rhinitis 03/25/2022   Encounter for examination following treatment at hospital 02/27/2022   BPH (benign prostatic hyperplasia) 09/24/2021   History of CVA with residual deficit 09/24/2021   Dysphagia, post-stroke    Spastic  hemiplegia affecting nondominant side (HCC)    Hemiparesis affecting left side as late effect of stroke (HCC)    Thyroid nodule 01/25/2021   Hypothyroidism 01/25/2021   H/O adenomatous polyp of colon 12/30/2020   Essential hypertension 11/28/2020   Mixed hyperlipidemia 11/28/2020   H/O medication noncompliance 09/07/2020   Nicotine dependence 12/05/2019   Prediabetes 10/24/2019   Tubular adenoma of colon 09/14/2017    Long term (current) use of anticoagulants 11/14/2014   Sickle cell trait (HCC) 08/29/2012   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 No pain. Reports HEP has been good. Doing it everyday.  Patient reports history of CVA in 2022. Reports he received rehab in hospital, then home health, then outpatient PT here. After discharge from here, reports he got more home health PT. Reports he's still walking around home. Reports he's back  because he feels he needs more. Has an Aid that lives with him. Reports mod assist from her for ADLs. Max A for iADLs. LBQC use and w/c at home. Doesn't use w/c. Sometimes takes it to Dr visits.   PERTINENT HISTORY: CVA in 2022 Recurrent Wounds Clotting disorder DVT/PE PAIN:  Are you having pain? No  PRECAUTIONS: None  RED FLAGS: Bowel or bladder incontinence: No   WEIGHT BEARING RESTRICTIONS: No  FALLS:  Has patient fallen in last 6 months? No  LIVING ENVIRONMENT: Lives with: lives with their family Health care aid and daughter sometimes visits Lives in: House/apartment Stairs: No Has following equipment at home: Chiropractor, Wheelchair (manual), and Ramped entry  OCCUPATION: N/A. Disability  PLOF: Independent prior to CVA  PATIENT GOALS: Like to get strength back in Leg, and use of L arm  and do more for himself   NEXT MD VISIT: Posey Rea, may be in June  OBJECTIVE:  Note: Objective  measures were completed at Evaluation unless otherwise noted.  PATIENT SURVEYS:  ABC scale    COGNITION: Overall cognitive status: Within functional limits for tasks assessed     SENSATION: Light touch: Impaired   Dec sensation t/o LLE, all dermatome levels.     Mild impairment in LUE  POSTURE: rounded shoulders, decreased lumbar lordosis, increased thoracic kyphosis, and flexed trunk   PALPATION: NT  LOWER EXTREMITY ROM:  Active ROM Right eval Left eval  Hip flexion    Hip extension    Hip abduction    Hip adduction    Hip internal rotation    Hip external rotation    Knee flexion    Knee extension    Ankle dorsiflexion    Ankle plantarflexion    Ankle inversion    Ankle eversion     (Blank rows = not tested)  LEFT UPPER EXTREMITY: Modified Ashworth level 2 2 (2) - More marked increase in tone but limb easily flexed.  RUE: shoulder flexion ROM WFL, 4+/5 MMT Minimal activation of L hand musculature, pt unable to open/close voluntarily.  LOWER EXTREMITY MMT:  MMT Right eval Left eval  Hip flexion 4- 3  Hip extension    Hip abduction    Hip adduction    Hip internal rotation    Hip external rotation    Knee flexion 4- 3  Knee extension 4- 3  Ankle dorsiflexion    Ankle plantarflexion    Ankle inversion    Ankle eversion     (Blank rows = not tested)   FUNCTIONAL TESTS:  5 times sit to stand: 37 Sec Timed up and go (TUG): 1 min 13 sec  All w/ use of armrests, and LBQA for stability once standing, CGA for safety  GAIT: Distance walked: 75 Assistive device utilized: Quad cane large base Level of assistance: Modified independence and SBA Comments: Pt ambulates with LBQC w/ extremely dec velocity, dec arm swing with LUE (shoulder abd, IR, elbow flexed, pronated, wrist flexed fingers flexed), forward lean, rounded shoulders, dec trunk rotation, step to pattern, dec knee flexion in LLE, dec heel strike in L foot, dec step lengths bilat, dec hip ext bilat.                                                                                                                                 TREATMENT DATE:  03/16/24: Review HEP and goals STS, 5x no UE support, mod A for safety Weight shifts in // bars, bilat UE support, 5" shifts onto LLE, 10x Standing marching in // bars, bilat UE support, 5" shifts onto LLE, 10x2 Mini squats in // bars, 10x ABC Scale  03/02/24:  PT Eval and HEP    PATIENT EDUCATION:  Education details: PT evaluation, objective findings, POC, Importance of HEP, Precautions, Clinic policies  Person educated: Patient Education method: Explanation and Demonstration Education comprehension: verbalized understanding and returned demonstration  HOME EXERCISE PROGRAM: Access Code: [email or web address] URL: [email or web address] Date: 03/02/2024 Prepared by: Virgia Griffins Powell-Butler  Exercises - Seated Shoulder Flexion AAROM with Hemiparetic UE  - 2 x daily - 7 x weekly - 2 sets - 10 reps - 5 hold - Side to Side Weight Shift with Counter Support  - 2 x daily - 7 x weekly - 2 sets - 10 reps - Sit to Stand with Counter Support  - 2 x daily - 7 x weekly - 2 sets - 10 reps  ASSESSMENT:  CLINICAL IMPRESSION: Patient tolerated session well. Limited with time due to late arrival. Review of HEP, with patient requiring v cues to form. Session focused on LE strengthening in weight bearing position. Patient demonstrating impaired balance with STS without using UE support, required mod A for balance/safety. Attempts to increase WB through LUE, pt reporting inc sharp pain in hand. Required frequent rest breaks t/o. Patient continuing to demo impaired static and dynamic balance, decreased LE strength, decreased endurance, and poor posture which he will benefit from continued skilled physical therapy in order to address to improve function and QOL.  Patient is a 63 y.o. male who was seen today for physical therapy evaluation and  treatment for G81.10  (ICD-10-CM) - Spastic hemiplegia affecting nondominant side (HCC). Patient with relevant history of CVA in 2022, affecting L side of body causing L sided hemiparesis. On this date, patient demonstrates decreased ROM of LUE, generalized weakness L>R, increased tone of L side, and impaired balance/coordination which may contribute to altered gait, decreased functional independence with ADLs/iADLs, and impaired endurance. Patient will benefit from skilled physical therapy in order to address the above to improve QOL and maximize  independence.   OBJECTIVE IMPAIRMENTS: Abnormal gait, decreased activity tolerance, decreased balance, decreased coordination, decreased endurance, decreased mobility, difficulty walking, decreased ROM, decreased strength, hypomobility, impaired sensation, impaired tone, impaired UE functional use, and improper body mechanics.   ACTIVITY LIMITATIONS: carrying, lifting, bending, squatting, stairs, transfers, bed mobility, bathing, toileting, dressing, and hygiene/grooming  PARTICIPATION LIMITATIONS: cleaning, laundry, driving, shopping, community activity, occupation, and yard work  PERSONAL FACTORS: Time since onset of injury/illness/exacerbation are also affecting patient's functional outcome.   REHAB POTENTIAL: Good  CLINICAL DECISION MAKING: Stable/uncomplicated  EVALUATION COMPLEXITY: Moderate   GOALS: Goals reviewed with patient? No  SHORT TERM GOALS: Target date: 03/16/24 Patient will be independent with performance of HEP to demonstrate adequate self management of symptoms.  Baseline:  Goal status: INITIAL  2.   Patient will report at least a 25% improvement with function or pain overall since beginning PT. Baseline:  Goal status: INITIAL   LONG TERM GOALS: Target date: 04/13/24  Patient will report at least a 50% improvement with function or pain overall since beginning PT. Baseline:  Goal status: INITIAL  2.  Patient will score at least a 4-/5 on LLE  MMT to demonstrate improved LE strength for functional activities.  Baseline:  Goal status: INITIAL  3.  Patient will improve TUG score by at least 30 seconds to decrease fall risk.  Baseline:  Goal status: INITIAL  4.  Patient will improve 5xSTS score by at least 10 seconds to demonstrate improved LE power for functional activities. Baseline:  Goal status: INITIAL  PLAN:  PT FREQUENCY: 2x/week  PT DURATION: 6 weeks  PLANNED INTERVENTIONS: 97164- PT Re-evaluation, 97110-Therapeutic exercises, 97530- Therapeutic activity, 97112- Neuromuscular re-education, 97535- Self Care, [id number]- Manual therapy, [id number]- Gait training, Patient/Family education, Balance training, and Joint mobilization  PLAN FOR NEXT SESSION: Progress WB activity of LUE/LLE, strengthening LLE/LUE, balance    5:50 PM, 03/16/24 Vega Withrow Powell-Butler, PT, DPT [facility] [place]

## 2024-03-17 DIAGNOSIS — R809 Proteinuria, unspecified: Secondary | ICD-10-CM | POA: Diagnosis not present

## 2024-03-17 DIAGNOSIS — N1831 Chronic kidney disease, stage 3a: Secondary | ICD-10-CM | POA: Diagnosis not present

## 2024-03-17 DIAGNOSIS — E876 Hypokalemia: Secondary | ICD-10-CM | POA: Diagnosis not present

## 2024-03-22 ENCOUNTER — Ambulatory Visit (HOSPITAL_COMMUNITY)

## 2024-03-22 ENCOUNTER — Encounter (HOSPITAL_COMMUNITY): Payer: Self-pay

## 2024-03-22 DIAGNOSIS — M6281 Muscle weakness (generalized): Secondary | ICD-10-CM

## 2024-03-22 DIAGNOSIS — G8114 Spastic hemiplegia affecting left nondominant side: Secondary | ICD-10-CM | POA: Diagnosis not present

## 2024-03-22 DIAGNOSIS — G8110 Spastic hemiplegia affecting unspecified side: Secondary | ICD-10-CM | POA: Diagnosis not present

## 2024-03-22 DIAGNOSIS — Z7409 Other reduced mobility: Secondary | ICD-10-CM | POA: Diagnosis not present

## 2024-03-22 NOTE — Therapy (Signed)
 OUTPATIENT PHYSICAL THERAPY LOWER EXTREMITY EVALUATION   Patient Name: Matthew Wise MRN: [id number] DOB:[birth year], 63 y.o., male Today's Date: 03/22/2024  END OF SESSION:  PT End of Session - 03/22/24 1644     Visit Number 3    Number of Visits 12    Date for PT Re-Evaluation 04/13/24    Authorization Type [employer] Medicare Dual Complete    Authorization Time Period No auth needed    Progress Note Due on Visit 10    PT Start Time 1644    PT Stop Time 1735    PT Time Calculation (min) 51 min    Equipment Utilized During Treatment Gait belt    Activity Tolerance Patient tolerated treatment well    Behavior During Therapy WFL for tasks assessed/performed;Flat affect             Past Medical History:  Diagnosis Date   Acute CVA (cerebrovascular accident) (HCC) 09/09/2020   Chronic anticoagulation 11/14/2014   CKD (chronic kidney disease) stage 3, GFR 30-59 ml/min (HCC) 10/24/2019   CKD (chronic kidney disease) stage 3, GFR 30-59 ml/min (HCC) 10/24/2019   Clotting disorder (HCC)    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 two simple tubular adenomas removed. patient was advised to go to [facility] for colonoscopy but he did not keep appt.    IR CT HEAD LTD  02/02/2021   IR PERCUTANEOUS ART THROMBECTOMY/INFUSION INTRACRANIAL INC DIAG ANGIO  02/02/2021   RADIOLOGY WITH ANESTHESIA N/A 02/02/2021   Procedure: IR WITH ANESTHESIA;  Surgeon: Luellen Sages, MD;  Location: [facility] OR;  Service: Radiology;  Laterality: N/A;   TEE WITHOUT CARDIOVERSION N/A 09/10/2020   Procedure: TRANSESOPHAGEAL ECHOCARDIOGRAM (TEE) WITH PROPOFOL ;  Surgeon: Gerard Knight, MD;  Location: [facility];  Service: Cardiovascular;  Laterality: N/A;   XI ROBOTIC LAPAROSCOPIC ASSISTED APPENDECTOMY N/A 10/09/2023   Procedure: XI ROBOTIC LAPAROSCOPIC ASSISTED APPENDECTOMY;  Surgeon: Marijo Shove, DO;  Location: [facility];  Service: General;  Laterality: N/A;   Patient Active Problem List   Diagnosis Date Noted   Postoperative ileus (HCC) 10/13/2023   Acute pancreatitis 10/08/2023   Skin abscess 10/01/2023   Acute non-recurrent sinusitis 01/15/2023   Gastroesophageal reflux disease 01/15/2023   Seizure with onset of stroke (HCC) 01/15/2023   Encounter for general adult medical examination with abnormal findings 07/06/2022   Stage 3a chronic kidney disease (HCC) 07/06/2022   Hospital discharge follow-up 04/30/2022   Obesity, Class I, BMI 30-34.9 04/25/2022   Generalized weakness    Near syncope    Non-seasonal allergic rhinitis 03/25/2022   Encounter for examination following treatment at hospital 02/27/2022   BPH (benign prostatic hyperplasia) 09/24/2021   History of CVA with residual deficit 09/24/2021   Dysphagia, post-stroke    Spastic  hemiplegia affecting nondominant side (HCC)    Hemiparesis affecting left side as late effect of stroke (HCC)    Thyroid  nodule 01/25/2021   Hypothyroidism 01/25/2021   H/O adenomatous polyp of colon 12/30/2020   Essential hypertension 11/28/2020   Mixed hyperlipidemia 11/28/2020   H/O medication noncompliance 09/07/2020   Nicotine  dependence 12/05/2019   Prediabetes 10/24/2019   Tubular adenoma of colon 09/14/2017    Long term (current) use of anticoagulants 11/14/2014   Sickle cell trait (HCC) 08/29/2012   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 He feels good. Hasn't done much today. Had a good Easter with family. Reports compliance with HEP.   Patient reports history of CVA in 2022. Reports he received rehab in hospital, then home health, then outpatient PT here. After discharge from here, reports he got more home health PT. Reports he's still walking around home. Reports he's back  because he feels he needs more. Has an Aid that lives with him. Reports mod assist from her for ADLs. Max A for iADLs. LBQC use and w/c at home. Doesn't use w/c. Sometimes takes it to Dr visits.   PERTINENT HISTORY: CVA in 2022 Recurrent Wounds Clotting disorder DVT/PE PAIN:  Are you having pain? No  PRECAUTIONS: None  RED FLAGS: Bowel or bladder incontinence: No   WEIGHT BEARING RESTRICTIONS: No  FALLS:  Has patient fallen in last 6 months? No  LIVING ENVIRONMENT: Lives with: lives with their family Health care aid and daughter sometimes visits Lives in: House/apartment Stairs: No Has following equipment at home: Chiropractor, Wheelchair (manual), and Ramped entry  OCCUPATION: N/A. Disability  PLOF: Independent prior to CVA  PATIENT GOALS: Like to get strength back in Leg, and use of L arm  and do more for himself   NEXT MD VISIT: [name], may be in  June  OBJECTIVE:  Note: Objective measures were completed at Evaluation unless otherwise noted.  PATIENT SURVEYS:  ABC scale    COGNITION: Overall cognitive status: Within functional limits for tasks assessed     SENSATION: Light touch: Impaired   Dec sensation t/o LLE, all dermatome levels.     Mild impairment in LUE  POSTURE: rounded shoulders, decreased lumbar lordosis, increased thoracic kyphosis, and flexed trunk   PALPATION: NT  LOWER EXTREMITY ROM:  Active ROM Right eval Left eval  Hip flexion    Hip extension    Hip abduction    Hip adduction    Hip internal rotation    Hip external rotation    Knee flexion    Knee extension    Ankle dorsiflexion    Ankle plantarflexion    Ankle inversion    Ankle eversion     (Blank rows = not tested)  LEFT UPPER EXTREMITY: Modified Ashworth level 2 2 (2) - More marked increase in tone but limb easily flexed.  RUE: shoulder flexion ROM WFL, 4+/5 MMT Minimal activation of L hand musculature, pt unable to open/close voluntarily.  LOWER EXTREMITY MMT:  MMT Right eval Left  eval  Hip flexion 4- 3  Hip extension    Hip abduction    Hip adduction    Hip internal rotation    Hip external rotation    Knee flexion 4- 3  Knee extension 4- 3  Ankle dorsiflexion    Ankle plantarflexion    Ankle inversion    Ankle eversion     (Blank rows = not tested)   FUNCTIONAL TESTS:  5 times sit to stand: 37 Sec Timed up and go (TUG): 1 min 13 sec  All w/ use of armrests, and LBQA for stability once standing, CGA for safety  GAIT: Distance walked: 75 Assistive device utilized: Quad cane large base Level of assistance: Modified independence and SBA Comments: Pt ambulates with LBQC w/ extremely dec velocity, dec arm swing with LUE (shoulder abd, IR, elbow flexed, pronated, wrist flexed fingers flexed), forward lean, rounded shoulders, dec trunk rotation, step to pattern, dec knee flexion in LLE, dec heel strike in L foot, dec  step lengths bilat, dec hip ext bilat.                                                                                                                                TREATMENT DATE:  03/22/24: NuStep, seat 14, level 1, 5' STS: 10x, one RUE pushing from arm rest Side Stepping in // bars: 14ft, 3x down and back Hip Abduction, 10x  5x w/ 5 lb. Ankle weight  5x w/ 3 lb. Ankel weight Step ups 2 inch, 10x, RUE support  Gait training with quad cane, 37', step through emphasized  03/16/24: Review HEP and goals STS, 5x no UE support, mod A for safety Weight shifts in // bars, bilat UE support, 5" shifts onto LLE, 10x Standing marching in // bars, bilat UE support, 5" shifts onto LLE, 10x2 Mini squats in // bars, 10x ABC Scale  03/02/24:  PT Eval and HEP    PATIENT EDUCATION:  Education details: PT evaluation, objective findings, POC, Importance of HEP, Precautions, Clinic policies  Person educated: Patient Education method: Explanation and Demonstration Education comprehension: verbalized understanding and returned demonstration  HOME EXERCISE PROGRAM: Access Code: [email or web address] URL: [email or web address] Date: 03/02/2024 Prepared by: Virgia Griffins Powell-Butler  Exercises - Seated Shoulder Flexion AAROM with Hemiparetic UE  - 2 x daily - 7 x weekly - 2 sets - 10 reps - 5 hold - Side to Side Weight Shift with Counter Support  - 2 x daily - 7 x weekly - 2 sets - 10 reps - Sit to Stand with Counter Support  - 2 x daily - 7 x weekly - 2 sets - 10 reps  ASSESSMENT:  CLINICAL IMPRESSION: Patient tolerated session well. Session began on Nustep for general full body warm up. Verbal cues unsuccessful so PT provides stabilization for LLE due to knee fall out t/o. LE strength, balance, and endurance addressed during remainder of session with side stepping, hip abduction, step ups, and  STS. Patient requires frequent rest breaks but encouraged to shorten them t/o. V cueing throughout for  control of LLE placement. Gait training at end of session with emphasis on step through pattern, increased gait speed noted. Patient will continue to benefit from skilled physical therapy in order to address LE strength, balance, and endurance to improve QOL and maximize  independence.   Patient is a 63 y.o. male who was seen today for physical therapy evaluation and treatment for G81.10 (ICD-10-CM) - Spastic hemiplegia affecting nondominant side (HCC). Patient with relevant history of CVA in 2022, affecting L side of body causing L sided hemiparesis. On this date, patient demonstrates decreased ROM of LUE, generalized weakness L>R, increased tone of L side, and impaired balance/coordination which may contribute to altered gait, decreased functional independence with ADLs/iADLs, and impaired endurance. Patient will benefit from skilled physical therapy in order to address the above to improve QOL and maximize  independence.   OBJECTIVE IMPAIRMENTS: Abnormal gait, decreased activity tolerance, decreased balance, decreased coordination, decreased endurance, decreased mobility, difficulty walking, decreased ROM, decreased strength, hypomobility, impaired sensation, impaired tone, impaired UE functional use, and improper body mechanics.   ACTIVITY LIMITATIONS: carrying, lifting, bending, squatting, stairs, transfers, bed mobility, bathing, toileting, dressing, and hygiene/grooming  PARTICIPATION LIMITATIONS: cleaning, laundry, driving, shopping, community activity, occupation, and yard work  PERSONAL FACTORS: Time since onset of injury/illness/exacerbation are also affecting patient's functional outcome.   REHAB POTENTIAL: Good  CLINICAL DECISION MAKING: Stable/uncomplicated  EVALUATION COMPLEXITY: Moderate   GOALS: Goals reviewed with patient? Yes  SHORT TERM GOALS: Target date: 03/16/24 Patient will be independent with performance of HEP to demonstrate adequate self management of symptoms.   Baseline:  Goal status: INITIAL  2.   Patient will report at least a 25% improvement with function or pain overall since beginning PT. Baseline:  Goal status: INITIAL   LONG TERM GOALS: Target date: 04/13/24  Patient will report at least a 50% improvement with function or pain overall since beginning PT. Baseline:  Goal status: INITIAL  2.  Patient will score at least a 4-/5 on LLE MMT to demonstrate improved LE strength for functional activities.  Baseline:  Goal status: INITIAL  3.  Patient will improve TUG score by at least 30 seconds to decrease fall risk.  Baseline:  Goal status: INITIAL  4.  Patient will improve 5xSTS score by at least 10 seconds to demonstrate improved LE power for functional activities. Baseline:  Goal status: INITIAL  PLAN:  PT FREQUENCY: 2x/week  PT DURATION: 6 weeks  PLANNED INTERVENTIONS: 97164- PT Re-evaluation, 97110-Therapeutic exercises, 97530- Therapeutic activity, 97112- Neuromuscular re-education, 97535- Self Care, [id number]- Manual therapy, [phone]- Gait training, Patient/Family education, Balance training, and Joint mobilization  PLAN FOR NEXT SESSION: Progress WB activity of LUE/LLE, strengthening LLE/LUE, balance, quadruped    5:48 PM, 03/22/24 Emrah Ariola Powell-Butler, PT, DPT [facility]

## 2024-03-27 ENCOUNTER — Encounter (HOSPITAL_COMMUNITY)

## 2024-03-29 ENCOUNTER — Ambulatory Visit (HOSPITAL_COMMUNITY)

## 2024-03-29 ENCOUNTER — Encounter (HOSPITAL_COMMUNITY): Payer: Self-pay

## 2024-03-29 DIAGNOSIS — M6281 Muscle weakness (generalized): Secondary | ICD-10-CM

## 2024-03-29 DIAGNOSIS — G8114 Spastic hemiplegia affecting left nondominant side: Secondary | ICD-10-CM | POA: Diagnosis not present

## 2024-03-29 DIAGNOSIS — G8110 Spastic hemiplegia affecting unspecified side: Secondary | ICD-10-CM | POA: Diagnosis not present

## 2024-03-29 DIAGNOSIS — Z7409 Other reduced mobility: Secondary | ICD-10-CM

## 2024-03-29 NOTE — Therapy (Signed)
 OUTPATIENT PHYSICAL THERAPY LOWER EXTREMITY EVALUATION   Patient Name: Matthew Wise MRN: [id number] DOB:[birth year], 62 y.o., male Today's Date: 03/29/2024  END OF SESSION:  PT End of Session - 03/29/24 1354     Visit Number 4    Number of Visits 12    Date for PT Re-Evaluation 04/13/24    Authorization Type [employer] Medicare Dual Complete    Authorization Time Period No auth needed    Progress Note Due on Visit 10    PT Start Time 1350    PT Stop Time 1430    PT Time Calculation (min) 40 min    Activity Tolerance Patient tolerated treatment well    Behavior During Therapy WFL for tasks assessed/performed;Flat affect             Past Medical History:  Diagnosis Date   Acute CVA (cerebrovascular accident) (HCC) 09/09/2020   Chronic anticoagulation 11/14/2014   CKD (chronic kidney disease) stage 3, GFR 30-59 ml/min (HCC) 10/24/2019   CKD (chronic kidney disease) stage 3, GFR 30-59 ml/min (HCC) 10/24/2019   Clotting disorder (HCC)    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 two simple tubular adenomas removed. patient was advised to go to [facility] for colonoscopy but he did not keep appt.   IR CT HEAD LTD  02/02/2021   IR PERCUTANEOUS ART  THROMBECTOMY/INFUSION INTRACRANIAL INC DIAG ANGIO  02/02/2021   RADIOLOGY WITH ANESTHESIA N/A 02/02/2021   Procedure: IR WITH ANESTHESIA;  Surgeon: Luellen Sages, MD;  Location: [facility] OR;  Service: Radiology;  Laterality: N/A;   TEE WITHOUT CARDIOVERSION N/A 09/10/2020   Procedure: TRANSESOPHAGEAL ECHOCARDIOGRAM (TEE) WITH PROPOFOL ;  Surgeon: Gerard Knight, MD;  Location: [facility];  Service: Cardiovascular;  Laterality: N/A;   XI ROBOTIC LAPAROSCOPIC ASSISTED APPENDECTOMY N/A 10/09/2023   Procedure: XI ROBOTIC LAPAROSCOPIC ASSISTED APPENDECTOMY;  Surgeon: Marijo Shove, DO;  Location: [facility];  Service: General;  Laterality: N/A;   Patient Active Problem List   Diagnosis Date Noted   Postoperative ileus (HCC) 10/13/2023   Acute pancreatitis 10/08/2023   Skin abscess 10/01/2023   Acute non-recurrent sinusitis 01/15/2023   Gastroesophageal reflux disease 01/15/2023   Seizure with onset of stroke (HCC) 01/15/2023   Encounter for general adult medical examination with abnormal findings 07/06/2022   Stage 3a chronic kidney disease (HCC) 07/06/2022   Hospital discharge follow-up 04/30/2022   Obesity, Class I, BMI 30-34.9 04/25/2022   Generalized weakness    Near syncope    Non-seasonal allergic rhinitis 03/25/2022   Encounter for examination following treatment at hospital 02/27/2022   BPH (benign prostatic hyperplasia) 09/24/2021   History of CVA with residual deficit 09/24/2021   Dysphagia, post-stroke    Spastic hemiplegia affecting nondominant side (HCC)    Hemiparesis  affecting left side as late effect of stroke (HCC)    Thyroid  nodule 01/25/2021   Hypothyroidism 01/25/2021   H/O adenomatous polyp of colon 12/30/2020   Essential hypertension 11/28/2020   Mixed hyperlipidemia 11/28/2020   H/O medication noncompliance 09/07/2020   Nicotine  dependence 12/05/2019   Prediabetes 10/24/2019   Tubular adenoma of colon 09/14/2017   Long term (current) use of anticoagulants  11/14/2014   Sickle cell trait (HCC) 08/29/2012   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 Wore his brace today since I asked him to bring it in but reports he doesn't feel it helps him any. Reports its heavy and caused a fall o=months ago so he didn't wear it again.   Patient reports history of CVA in 2022. Reports he received rehab in hospital, then home health, then outpatient PT here. After discharge from here, reports he got more home health PT. Reports he's still walking around home. Reports he's back  because he feels he needs more. Has an Aid that lives with him. Reports mod assist from her for ADLs. Max A for iADLs. LBQC use and w/c at home. Doesn't use w/c. Sometimes takes it to Dr visits.   PERTINENT HISTORY: CVA in 2022 Recurrent Wounds Clotting disorder DVT/PE PAIN:  Are you having pain? No  PRECAUTIONS: None  RED FLAGS: Bowel or bladder incontinence: No   WEIGHT BEARING RESTRICTIONS: No  FALLS:  Has patient fallen in last 6 months? No  LIVING ENVIRONMENT: Lives with: lives with their family Health care aid and daughter sometimes visits Lives in: House/apartment Stairs: No Has following equipment at home: Chiropractor, Wheelchair (manual), and Ramped entry  OCCUPATION: N/A. Disability  PLOF: Independent prior to CVA  PATIENT GOALS: Like to get strength back in Leg, and use of L arm  and do more  for himself   NEXT MD VISIT: [name], may be in June  OBJECTIVE:  Note: Objective measures were completed at Evaluation unless otherwise noted.  PATIENT SURVEYS:  ABC scale    COGNITION: Overall cognitive status: Within functional limits for tasks assessed     SENSATION: Light touch: Impaired   Dec sensation t/o LLE, all dermatome levels.     Mild impairment in LUE  POSTURE: rounded shoulders, decreased lumbar lordosis, increased thoracic kyphosis, and flexed trunk   PALPATION: NT  LOWER EXTREMITY ROM:  Active ROM Right eval Left eval  Hip flexion    Hip extension    Hip abduction    Hip adduction    Hip internal rotation    Hip external rotation    Knee flexion    Knee extension    Ankle dorsiflexion    Ankle plantarflexion    Ankle inversion    Ankle eversion     (Blank rows = not tested)  LEFT UPPER EXTREMITY: Modified Ashworth level 2 2 (2) - More marked increase in tone but limb easily flexed.  RUE: shoulder flexion ROM WFL, 4+/5 MMT Minimal activation of L hand musculature, pt  unable to open/close voluntarily.  LOWER EXTREMITY MMT:  MMT Right eval Left eval  Hip flexion 4- 3  Hip extension    Hip abduction    Hip adduction    Hip internal rotation    Hip external rotation    Knee flexion 4- 3  Knee extension 4- 3  Ankle dorsiflexion    Ankle plantarflexion    Ankle inversion    Ankle eversion     (Blank rows = not tested)   FUNCTIONAL TESTS:  5 times sit to stand: 37 Sec Timed up and go (TUG): 1 min 13 sec  All w/ use of armrests, and LBQA for stability once standing, CGA for safety  GAIT: Distance walked: 75 Assistive device utilized: Quad cane large base Level of assistance: Modified independence and SBA Comments: Pt ambulates with LBQC w/ extremely dec velocity, dec arm swing with LUE (shoulder abd, IR, elbow flexed, pronated, wrist flexed fingers flexed), forward lean, rounded shoulders, dec trunk rotation, step to pattern, dec knee  flexion in LLE, dec heel strike in L foot, dec step lengths bilat, dec hip ext bilat.                                                                                                                                TREATMENT DATE:  03/29/24: Ambulation trial with slightly adjusted brace donned : ~50' Bridges: 3x10  1st set with PT providing stabilization at ankle and knee 2nd set with no assist at knee, v cueing for controlling knee fall out, little improvement  3rd set with GTB around knee, PT assist at ankle only Passive stretching of LUE and hand, pt standing at mat table, PT support at elbow, and providing input to L hand, pt reports pain with pressure Standing Hamstring curl, 10x each leg Standing hip abduction, 10x, v cues for control Standing marching, 10x2  03/22/24: NuStep, seat 14, level 1, 5' STS: 10x, one RUE pushing from arm rest Side Stepping in // bars: 93ft, 3x down and back Hip Abduction, 10x  5x w/ 5 lb. Ankle weight  5x w/ 3 lb. Ankel weight Step ups 2 inch, 10x, RUE support  Gait training with quad cane, 37', step through emphasized  03/16/24: Review HEP and goals STS, 5x no UE support, mod A for safety Weight shifts in // bars, bilat UE support, 5" shifts onto LLE, 10x Standing marching in // bars, bilat UE support, 5" shifts onto LLE, 10x2 Mini squats in // bars, 10x ABC Scale  03/02/24:  PT Eval and HEP    PATIENT EDUCATION:  Education details: PT evaluation, objective findings, POC, Importance of HEP, Precautions, Clinic policies  Person educated: Patient Education method: Explanation and Demonstration Education comprehension: verbalized understanding and returned demonstration  HOME EXERCISE PROGRAM: Access Code: [email or web address] URL: [email or web address] Date: 03/02/2024 Prepared by: Virgia Griffins Powell-Butler  Exercises - Seated Shoulder Flexion AAROM with Hemiparetic UE  - 2 x daily - 7 x  weekly - 2 sets - 10 reps - 5 hold - Side to Side Weight  Shift with Counter Support  - 2 x daily - 7 x weekly - 2 sets - 10 reps - Sit to Stand with Counter Support  - 2 x daily - 7 x weekly - 2 sets - 10 reps  ASSESSMENT:  CLINICAL IMPRESSION: Patient tolerated session well today. Patient brings left AFO today and reports it doesn't help him much. Reports it feels like it cuts into the anterior aspect of his leg and is just heavy, which caused his fall months ago. Took a look at it but there was no way to alter besides loosening top strap. Patient reports no difference. Education given on increased workload of ambulation due to inc weight of his metal AFO possibly being cause of fall. AFO doesn't improve gait much so therapist reassures that may be safer to continue ambulation without AFO at this time. Remainder of session includes exercises to increase input, strengthen, and improve control/proprioception of LLE with bridges, where patient requires assist to limit knee fall out, and standing LE exercises all while stretching and providing input to LUE. Pt reports inc discomfort with inc weight bearing through LUE. V cues for ecc control during LLE motion. Patient will continue to benefit from skilled physical therapy in order to address LE strength, balance, and endurance to improve QOL and maximize  independence.    Patient is a 63 y.o. male who was seen today for physical therapy evaluation and treatment for G81.10 (ICD-10-CM) - Spastic hemiplegia affecting nondominant side (HCC). Patient with relevant history of CVA in 2022, affecting L side of body causing L sided hemiparesis. On this date, patient demonstrates decreased ROM of LUE, generalized weakness L>R, increased tone of L side, and impaired balance/coordination which may contribute to altered gait, decreased functional independence with ADLs/iADLs, and impaired endurance. Patient will benefit from skilled physical therapy in order to address the above to improve QOL and maximize   independence.   OBJECTIVE IMPAIRMENTS: Abnormal gait, decreased activity tolerance, decreased balance, decreased coordination, decreased endurance, decreased mobility, difficulty walking, decreased ROM, decreased strength, hypomobility, impaired sensation, impaired tone, impaired UE functional use, and improper body mechanics.   ACTIVITY LIMITATIONS: carrying, lifting, bending, squatting, stairs, transfers, bed mobility, bathing, toileting, dressing, and hygiene/grooming  PARTICIPATION LIMITATIONS: cleaning, laundry, driving, shopping, community activity, occupation, and yard work  PERSONAL FACTORS: Time since onset of injury/illness/exacerbation are also affecting patient's functional outcome.   REHAB POTENTIAL: Good  CLINICAL DECISION MAKING: Stable/uncomplicated  EVALUATION COMPLEXITY: Moderate   GOALS: Goals reviewed with patient? Yes  SHORT TERM GOALS: Target date: 03/16/24 Patient will be independent with performance of HEP to demonstrate adequate self management of symptoms.  Baseline:  Goal status: INITIAL  2.   Patient will report at least a 25% improvement with function or pain overall since beginning PT. Baseline:  Goal status: INITIAL   LONG TERM GOALS: Target date: 04/13/24  Patient will report at least a 50% improvement with function or pain overall since beginning PT. Baseline:  Goal status: INITIAL  2.  Patient will score at least a 4-/5 on LLE MMT to demonstrate improved LE strength for functional activities.  Baseline:  Goal status: INITIAL  3.  Patient will improve TUG score by at least 30 seconds to decrease fall risk.  Baseline:  Goal status: INITIAL  4.  Patient will improve 5xSTS score by at least 10 seconds to demonstrate improved LE power for functional activities. Baseline:  Goal status: INITIAL  PLAN:  PT FREQUENCY: 2x/week  PT DURATION: 6 weeks  PLANNED INTERVENTIONS: 97164- PT Re-evaluation, 97110-Therapeutic exercises, 97530-  Therapeutic activity, 97112- Neuromuscular re-education, 97535- Self Care, 16109- Manual therapy, [phone]- Gait training, Patient/Family education, Balance training, and Joint mobilization  PLAN FOR NEXT SESSION: Progress WB activity of LUE/LLE, strengthening LLE/LUE, balance, quadruped     5:09 PM, 03/29/24 Aspin Palomarez Powell-Butler, PT, DPT [facility] [place]

## 2024-04-04 ENCOUNTER — Encounter (HOSPITAL_COMMUNITY): Payer: Self-pay

## 2024-04-04 ENCOUNTER — Ambulatory Visit (HOSPITAL_COMMUNITY): Attending: Internal Medicine

## 2024-04-04 DIAGNOSIS — M6281 Muscle weakness (generalized): Secondary | ICD-10-CM | POA: Insufficient documentation

## 2024-04-04 DIAGNOSIS — R262 Difficulty in walking, not elsewhere classified: Secondary | ICD-10-CM | POA: Insufficient documentation

## 2024-04-04 DIAGNOSIS — G8114 Spastic hemiplegia affecting left nondominant side: Secondary | ICD-10-CM | POA: Insufficient documentation

## 2024-04-04 DIAGNOSIS — Z7409 Other reduced mobility: Secondary | ICD-10-CM | POA: Insufficient documentation

## 2024-04-04 DIAGNOSIS — R2689 Other abnormalities of gait and mobility: Secondary | ICD-10-CM | POA: Diagnosis not present

## 2024-04-04 NOTE — Therapy (Signed)
 OUTPATIENT PHYSICAL THERAPY LOWER EXTREMITY TREATMENT   Patient Name: Matthew Wise MRN: [id number] DOB:04-21-1961, 63 y.o., male Today's Date: 04/04/2024  END OF SESSION:  PT End of Session - 04/04/24 1200     Visit Number 5    Number of Visits 12    Date for PT Re-Evaluation 04/13/24    Authorization Type [employer] Medicare Dual Complete    Authorization Time Period No auth needed    Progress Note Due on Visit 10    PT Start Time 1105    PT Stop Time 1149    PT Time Calculation (min) 44 min    Equipment Utilized During Treatment Gait belt    Activity Tolerance Patient tolerated treatment well    Behavior During Therapy WFL for tasks assessed/performed;Flat affect              Past Medical History:  Diagnosis Date   Acute CVA (cerebrovascular accident) (HCC) 09/09/2020   Chronic anticoagulation 11/14/2014   CKD (chronic kidney disease) stage 3, GFR 30-59 ml/min (HCC) 10/24/2019   CKD (chronic kidney disease) stage 3, GFR 30-59 ml/min (HCC) 10/24/2019   Clotting disorder (HCC)    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 two simple tubular adenomas removed. patient was advised to go to [facility] for colonoscopy but he did not keep appt.    IR CT HEAD LTD  02/02/2021   IR PERCUTANEOUS ART THROMBECTOMY/INFUSION INTRACRANIAL INC DIAG ANGIO  02/02/2021   RADIOLOGY WITH ANESTHESIA N/A 02/02/2021   Procedure: IR WITH ANESTHESIA;  Surgeon: Luellen Sages, MD;  Location: [facility] OR;  Service: Radiology;  Laterality: N/A;   TEE WITHOUT CARDIOVERSION N/A 09/10/2020   Procedure: TRANSESOPHAGEAL ECHOCARDIOGRAM (TEE) WITH PROPOFOL ;  Surgeon: Gerard Knight, MD;  Location: [facility];  Service: Cardiovascular;  Laterality: N/A;   XI ROBOTIC LAPAROSCOPIC ASSISTED APPENDECTOMY N/A 10/09/2023   Procedure: XI ROBOTIC LAPAROSCOPIC ASSISTED APPENDECTOMY;  Surgeon: Marijo Shove, DO;  Location: [facility];  Service: General;  Laterality: N/A;   Patient Active Problem List   Diagnosis Date Noted   Postoperative ileus (HCC) 10/13/2023   Acute pancreatitis 10/08/2023   Skin abscess 10/01/2023   Acute non-recurrent sinusitis 01/15/2023   Gastroesophageal reflux disease 01/15/2023   Seizure with onset of stroke (HCC) 01/15/2023   Encounter for general adult medical examination with abnormal findings 07/06/2022   Stage 3a chronic kidney disease (HCC) 07/06/2022   Hospital discharge follow-up 04/30/2022   Obesity, Class I, BMI 30-34.9 04/25/2022   Generalized weakness    Near syncope    Non-seasonal allergic rhinitis 03/25/2022   Encounter for examination following treatment at hospital 02/27/2022   BPH (benign prostatic hyperplasia) 09/24/2021   History of CVA with residual deficit 09/24/2021   Dysphagia, post-stroke  Spastic hemiplegia affecting nondominant side (HCC)    Hemiparesis affecting left side as late effect of stroke (HCC)    Thyroid  nodule 01/25/2021   Hypothyroidism 01/25/2021   H/O adenomatous polyp of colon 12/30/2020   Essential hypertension 11/28/2020   Mixed hyperlipidemia 11/28/2020   H/O medication noncompliance 09/07/2020   Nicotine  dependence 12/05/2019   Prediabetes 10/24/2019   Tubular adenoma of colon 09/14/2017    Long term (current) use of anticoagulants 11/14/2014   Sickle cell trait (HCC) 08/29/2012   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  Ambulting with QC, not wearing brace today, feels it isn't helpful.  Patient reports history of CVA in 2022. Reports he received rehab in hospital, then home health, then outpatient PT here. After discharge from here, reports he got more home health PT. Reports he's still walking around home. Reports he's back  because he feels he needs more. Has an Aid that lives with him. Reports mod assist from her for ADLs. Max A for iADLs. LBQC use and w/c at home. Doesn't use w/c. Sometimes takes it to Dr visits.   PERTINENT HISTORY: CVA in 2022 Recurrent Wounds Clotting disorder DVT/PE PAIN:  Are you having pain? No  PRECAUTIONS: None  RED FLAGS: Bowel or bladder incontinence: No   WEIGHT BEARING RESTRICTIONS: No  FALLS:  Has patient fallen in last 6 months? No  LIVING ENVIRONMENT: Lives with: lives with their family Health care aid and daughter sometimes visits Lives in: House/apartment Stairs: No Has following equipment at home: Chiropractor, Wheelchair (manual), and Ramped entry  OCCUPATION: N/A. Disability  PLOF: Independent prior to CVA  PATIENT GOALS: Like to get strength back in Leg, and use of L arm  and do more for himself   NEXT MD VISIT: [name], may be in June  OBJECTIVE:  Note: Objective measures were completed at Evaluation unless  otherwise noted.  PATIENT SURVEYS:  ABC scale    COGNITION: Overall cognitive status: Within functional limits for tasks assessed     SENSATION: Light touch: Impaired   Dec sensation t/o LLE, all dermatome levels.     Mild impairment in LUE  POSTURE: rounded shoulders, decreased lumbar lordosis, increased thoracic kyphosis, and flexed trunk   PALPATION: NT  LOWER EXTREMITY ROM:  Active ROM Right eval Left eval  Hip flexion    Hip extension    Hip abduction    Hip adduction    Hip internal rotation    Hip external rotation    Knee flexion    Knee extension    Ankle dorsiflexion    Ankle plantarflexion    Ankle inversion    Ankle eversion     (Blank rows = not tested)  LEFT UPPER EXTREMITY: Modified Ashworth level 2 2 (2) - More marked increase in tone but limb easily flexed.  RUE: shoulder flexion ROM WFL, 4+/5 MMT Minimal activation of L hand musculature, pt unable to open/close voluntarily.  LOWER EXTREMITY MMT:  MMT Right eval Left eval  Hip flexion 4- 3  Hip extension  Hip abduction    Hip adduction    Hip internal rotation    Hip external rotation    Knee flexion 4- 3  Knee extension 4- 3  Ankle dorsiflexion    Ankle plantarflexion    Ankle inversion    Ankle eversion     (Blank rows = not tested)   FUNCTIONAL TESTS:  5 times sit to stand: 37 Sec Timed up and go (TUG): 1 min 13 sec  All w/ use of armrests, and LBQA for stability once standing, CGA for safety  GAIT: Distance walked: 75 Assistive device utilized: Quad cane large base Level of assistance: Modified independence and SBA Comments: Pt ambulates with LBQC w/ extremely dec velocity, dec arm swing with LUE (shoulder abd, IR, elbow flexed, pronated, wrist flexed fingers flexed), forward lean, rounded shoulders, dec trunk rotation, step to pattern, dec knee flexion in LLE, dec heel strike in L foot, dec step lengths bilat, dec hip ext bilat.                                                                                                                                 TREATMENT DATE:  04/04/24:  QC Gait training 47ft with CGA Weight shifting with UE on mat with Lt hand on small ball forward/backward with assistance for hand and wrist extension Sidelying: clam Supine: Bridge with ball between knees 10x 5" Standing: Passive stretching of LUE and hand, pt standing at mat table, PT support at elbow, and providing input to L hand, pt reports pain with pressure Standing Hamstring curl, 10x each leg Standing hip abduction, 10x, v cues for control  03/29/24: Ambulation trial with slightly adjusted brace donned : ~50' Bridges: 3x10  1st set with PT providing stabilization at ankle and knee 2nd set with no assist at knee, v cueing for controlling knee fall out, little improvement  3rd set with GTB around knee, PT assist at ankle only Passive stretching of LUE and hand, pt standing at mat table, PT support at elbow, and providing input to L hand, pt reports pain with pressure Standing Hamstring curl, 10x each leg Standing hip abduction, 10x, v cues for control Standing marching, 10x2  03/22/24: NuStep, seat 14, level 1, 5' STS: 10x, one RUE pushing from arm rest Side Stepping in // bars: 77ft, 3x down and back Hip Abduction, 10x  5x w/ 5 lb. Ankle weight  5x w/ 3 lb. Ankel weight Step ups 2 inch, 10x, RUE support  Gait training with quad cane, 37', step through emphasized  03/16/24: Review HEP and goals STS, 5x no UE support, mod A for safety Weight shifts in // bars, bilat UE support, 5" shifts onto LLE, 10x Standing marching in // bars, bilat UE support, 5" shifts onto LLE, 10x2 Mini squats in // bars, 10x ABC Scale  03/02/24:  PT Eval and HEP    PATIENT EDUCATION:  Education details: PT evaluation, objective findings, POC,  Importance of HEP, Precautions, Clinic policies  Person educated: Patient Education method: Explanation and Demonstration Education  comprehension: verbalized understanding and returned demonstration  HOME EXERCISE PROGRAM: Access Code: [email or web address] URL: [email or web address] Date: 03/02/2024 Prepared by: Virgia Griffins Powell-Butler  Exercises - Seated Shoulder Flexion AAROM with Hemiparetic UE  - 2 x daily - 7 x weekly - 2 sets - 10 reps - 5 hold - Side to Side Weight Shift with Counter Support  - 2 x daily - 7 x weekly - 2 sets - 10 reps - Sit to Stand with Counter Support  - 2 x daily - 7 x weekly - 2 sets - 10 reps  ASSESSMENT:  CLINICAL IMPRESSION: Pt presents with decreased activity tolerance and Lt sided weakness.  Cueing to improve step length Rt  and stance phase Lt, stable with QC during ambulation.  Added modified quadruped exercises to improve weight bearing on Lt UE, used ball to address flexion contracture on hand to assist with weight bearing, pt reports increased fatigue with Lt LE during static standing and required rest break.  Easily fatigued this session, required 3 seated rest breaks through session.  Required assistance with Lt LE to improve control and proprioception during strengthening exercises as very weak adductor and tendency to ER LE.  No reports of pain through session.   Patient is a 63 y.o. male who was seen today for physical therapy evaluation and treatment for G81.10 (ICD-10-CM) - Spastic hemiplegia affecting nondominant side (HCC). Patient with relevant history of CVA in 2022, affecting L side of body causing L sided hemiparesis. On this date, patient demonstrates decreased ROM of LUE, generalized weakness L>R, increased tone of L side, and impaired balance/coordination which may contribute to altered gait, decreased functional independence with ADLs/iADLs, and impaired endurance. Patient will benefit from skilled physical therapy in order to address the above to improve QOL and maximize  independence.   OBJECTIVE IMPAIRMENTS: Abnormal gait, decreased activity tolerance, decreased  balance, decreased coordination, decreased endurance, decreased mobility, difficulty walking, decreased ROM, decreased strength, hypomobility, impaired sensation, impaired tone, impaired UE functional use, and improper body mechanics.   ACTIVITY LIMITATIONS: carrying, lifting, bending, squatting, stairs, transfers, bed mobility, bathing, toileting, dressing, and hygiene/grooming  PARTICIPATION LIMITATIONS: cleaning, laundry, driving, shopping, community activity, occupation, and yard work  PERSONAL FACTORS: Time since onset of injury/illness/exacerbation are also affecting patient's functional outcome.   REHAB POTENTIAL: Good  CLINICAL DECISION MAKING: Stable/uncomplicated  EVALUATION COMPLEXITY: Moderate   GOALS: Goals reviewed with patient? Yes  SHORT TERM GOALS: Target date: 03/16/24 Patient will be independent with performance of HEP to demonstrate adequate self management of symptoms.  Baseline:  Goal status: INITIAL  2.   Patient will report at least a 25% improvement with function or pain overall since beginning PT. Baseline:  Goal status: INITIAL   LONG TERM GOALS: Target date: 04/13/24  Patient will report at least a 50% improvement with function or pain overall since beginning PT. Baseline:  Goal status: INITIAL  2.  Patient will score at least a 4-/5 on LLE MMT to demonstrate improved LE strength for functional activities.  Baseline:  Goal status: INITIAL  3.  Patient will improve TUG score by at least 30 seconds to decrease fall risk.  Baseline:  Goal status: INITIAL  4.  Patient will improve 5xSTS score by at least 10 seconds to demonstrate improved LE power for functional activities. Baseline:  Goal status: INITIAL  PLAN:  PT FREQUENCY: 2x/week  PT DURATION: 6 weeks  PLANNED INTERVENTIONS:  13086- PT Re-evaluation, 97110-Therapeutic exercises, 97530- Therapeutic activity, [id number]- Neuromuscular re-education, [phone]- Self Care, [id number]- Manual therapy, [phone]-  Gait training, Patient/Family education, Balance training, and Joint mobilization  PLAN FOR NEXT SESSION: Progress WB activity of LUE/LLE, strengthening LLE/LUE, balance, quadruped    Minor Amble, LPTA/CLT; CBIS [phone]  4:31 PM, 04/04/24

## 2024-04-06 ENCOUNTER — Ambulatory Visit (HOSPITAL_COMMUNITY)

## 2024-04-06 ENCOUNTER — Encounter (HOSPITAL_COMMUNITY): Payer: Self-pay

## 2024-04-06 DIAGNOSIS — R2689 Other abnormalities of gait and mobility: Secondary | ICD-10-CM | POA: Diagnosis not present

## 2024-04-06 DIAGNOSIS — M6281 Muscle weakness (generalized): Secondary | ICD-10-CM

## 2024-04-06 DIAGNOSIS — G8114 Spastic hemiplegia affecting left nondominant side: Secondary | ICD-10-CM | POA: Diagnosis not present

## 2024-04-06 DIAGNOSIS — Z7409 Other reduced mobility: Secondary | ICD-10-CM | POA: Diagnosis not present

## 2024-04-06 DIAGNOSIS — R262 Difficulty in walking, not elsewhere classified: Secondary | ICD-10-CM | POA: Diagnosis not present

## 2024-04-06 NOTE — Therapy (Signed)
 OUTPATIENT PHYSICAL THERAPY LOWER EXTREMITY TREATMENT   Patient Name: Matthew Wise MRN: [id number] DOB:19-Jan-1961, 63 y.o., male Today's Date: 04/06/2024  END OF SESSION:  PT End of Session - 04/06/24 1147     Visit Number 6    Number of Visits 12    Date for PT Re-Evaluation 04/13/24    Authorization Type [employer] Medicare Dual Complete    Authorization Time Period No auth needed    Progress Note Due on Visit 10    PT Start Time 1147    PT Stop Time 1230    PT Time Calculation (min) 43 min    Activity Tolerance Patient tolerated treatment well    Behavior During Therapy WFL for tasks assessed/performed;Flat affect              Past Medical History:  Diagnosis Date   Acute CVA (cerebrovascular accident) (HCC) 09/09/2020   Chronic anticoagulation 11/14/2014   CKD (chronic kidney disease) stage 3, GFR 30-59 ml/min (HCC) 10/24/2019   CKD (chronic kidney disease) stage 3, GFR 30-59 ml/min (HCC) 10/24/2019   Clotting disorder (HCC)    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 two simple tubular adenomas removed. patient was advised to go to [facility] for colonoscopy but he did not keep appt.   IR CT HEAD LTD  02/02/2021   IR PERCUTANEOUS ART  THROMBECTOMY/INFUSION INTRACRANIAL INC DIAG ANGIO  02/02/2021   RADIOLOGY WITH ANESTHESIA N/A 02/02/2021   Procedure: IR WITH ANESTHESIA;  Surgeon: Luellen Sages, MD;  Location: [facility] OR;  Service: Radiology;  Laterality: N/A;   TEE WITHOUT CARDIOVERSION N/A 09/10/2020   Procedure: TRANSESOPHAGEAL ECHOCARDIOGRAM (TEE) WITH PROPOFOL ;  Surgeon: Gerard Knight, MD;  Location: [facility]S;  Service: Cardiovascular;  Laterality: N/A;   XI ROBOTIC LAPAROSCOPIC ASSISTED APPENDECTOMY N/A 10/09/2023   Procedure: XI ROBOTIC LAPAROSCOPIC ASSISTED APPENDECTOMY;  Surgeon: Marijo Shove, DO;  Location: [facility];  Service: General;  Laterality: N/A;   Patient Active Problem List   Diagnosis Date Noted   Postoperative ileus (HCC) 10/13/2023   Acute pancreatitis 10/08/2023   Skin abscess 10/01/2023   Acute non-recurrent sinusitis 01/15/2023   Gastroesophageal reflux disease 01/15/2023   Seizure with onset of stroke (HCC) 01/15/2023   Encounter for general adult medical examination with abnormal findings 07/06/2022   Stage 3a chronic kidney disease (HCC) 07/06/2022   Hospital discharge follow-up 04/30/2022   Obesity, Class I, BMI 30-34.9 04/25/2022   Generalized weakness    Near syncope    Non-seasonal allergic rhinitis 03/25/2022   Encounter for examination following treatment at hospital 02/27/2022   BPH (benign prostatic hyperplasia) 09/24/2021   History of CVA with residual deficit 09/24/2021   Dysphagia, post-stroke    Spastic hemiplegia affecting nondominant side (HCC)  Hemiparesis affecting left side as late effect of stroke (HCC)    Thyroid  nodule 01/25/2021   Hypothyroidism 01/25/2021   H/O adenomatous polyp of colon 12/30/2020   Essential hypertension 11/28/2020   Mixed hyperlipidemia 11/28/2020   H/O medication noncompliance 09/07/2020   Nicotine  dependence 12/05/2019   Prediabetes 10/24/2019   Tubular adenoma of colon 09/14/2017   Long term (current) use of anticoagulants  11/14/2014   Sickle cell trait (HCC) 08/29/2012   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 No pain.  Patient reports history of CVA in 2022. Reports he received rehab in hospital, then home health, then outpatient PT here. After discharge from here, reports he got more home health PT. Reports he's still walking around home. Reports he's back  because he feels he needs more. Has an Aid that lives with him. Reports mod assist from her for ADLs. Max A for iADLs. LBQC use and w/c at home. Doesn't use w/c. Sometimes takes it to Dr visits.   PERTINENT HISTORY: CVA in 2022 Recurrent Wounds Clotting disorder DVT/PE PAIN:  Are you having pain? No  PRECAUTIONS: None  RED FLAGS: Bowel or bladder incontinence: No   WEIGHT BEARING RESTRICTIONS: No  FALLS:  Has patient fallen in last 6 months? No  LIVING ENVIRONMENT: Lives with: lives with their family Health care aid and daughter sometimes visits Lives in: House/apartment Stairs: No Has following equipment at home: Chiropractor, Wheelchair (manual), and Ramped entry  OCCUPATION: N/A. Disability  PLOF: Independent prior to CVA  PATIENT GOALS: Like to get strength back in Leg, and use of L arm  and do more for himself   NEXT MD VISIT: [name], may be in June  OBJECTIVE:  Note: Objective measures were completed at Evaluation unless  otherwise noted.  PATIENT SURVEYS:  ABC scale    COGNITION: Overall cognitive status: Within functional limits for tasks assessed     SENSATION: Light touch: Impaired   Dec sensation t/o LLE, all dermatome levels.     Mild impairment in LUE  POSTURE: rounded shoulders, decreased lumbar lordosis, increased thoracic kyphosis, and flexed trunk   PALPATION: NT  LOWER EXTREMITY ROM:  Active ROM Right eval Left eval  Hip flexion    Hip extension    Hip abduction    Hip adduction    Hip internal rotation    Hip external rotation    Knee flexion    Knee extension    Ankle dorsiflexion    Ankle plantarflexion    Ankle inversion    Ankle eversion     (Blank rows = not tested)  LEFT UPPER EXTREMITY: Modified Ashworth level 2 2 (2) - More marked increase in tone but limb easily flexed.  RUE: shoulder flexion ROM WFL, 4+/5 MMT Minimal activation of L hand musculature, pt unable to open/close voluntarily.  LOWER EXTREMITY MMT:  MMT Right eval Left eval  Hip flexion 4- 3  Hip extension    Hip abduction  Hip adduction    Hip internal rotation    Hip external rotation    Knee flexion 4- 3  Knee extension 4- 3  Ankle dorsiflexion    Ankle plantarflexion    Ankle inversion    Ankle eversion     (Blank rows = not tested)   FUNCTIONAL TESTS:  5 times sit to stand: 37 Sec Timed up and go (TUG): 1 min 13 sec  All w/ use of armrests, and LBQA for stability once standing, CGA for safety  GAIT: Distance walked: 75 Assistive device utilized: Quad cane large base Level of assistance: Modified independence and SBA Comments: Pt ambulates with LBQC w/ extremely dec velocity, dec arm swing with LUE (shoulder abd, IR, elbow flexed, pronated, wrist flexed fingers flexed), forward lean, rounded shoulders, dec trunk rotation, step to pattern, dec knee flexion in LLE, dec heel strike in L foot, dec step lengths bilat, dec hip ext bilat.                                                                                                                                 TREATMENT DATE:  04/06/24: -Gait training, v cues for inc hip/knee flexion with LLE, step through with RLE, and decreasing distance of QC placement to limit trunk forward flexion  73ft-->30ft-->20ft  -Ther Act: in // bars, Cones placed on L side, pt stepping over flat obstacle on ground, goal to step over obstacle and back while avoiding knocking over cones to reduce circumduction, 8x -Ther Act: pt standing against wall with L side of body, v cues for upright posture, marches with LLE, support with QC, 2x10 -STS: 5x t/o session, initially required mod A-->CGA-->mod(I)   04/04/24:  QC Gait training 4ft with CGA Weight shifting with UE on mat with Lt hand on small ball forward/backward with assistance for hand and wrist extension Sidelying: clam Supine: Bridge with ball between knees 10x 5" Standing: Passive stretching of LUE and hand, pt standing at mat table, PT support at elbow, and providing input to L hand, pt reports pain with pressure Standing Hamstring curl, 10x each leg Standing hip abduction, 10x, v cues for control  03/29/24: Ambulation trial with slightly adjusted brace donned : ~50' Bridges: 3x10  1st set with PT providing stabilization at ankle and knee 2nd set with no assist at knee, v cueing for controlling knee fall out, little improvement  3rd set with GTB around knee, PT assist at ankle only Passive stretching of LUE and hand, pt standing at mat table, PT support at elbow, and providing input to L hand, pt reports pain with pressure Standing Hamstring curl, 10x each leg Standing hip abduction, 10x, v cues for control Standing marching, 10x2  03/22/24: NuStep, seat 14, level 1, 5' STS: 10x, one RUE pushing from arm rest Side Stepping in // bars: 60ft, 3x down and back Hip Abduction, 10x  5x w/ 5 lb. Ankle weight  5x w/  3 lb. Ankel weight Step ups 2 inch, 10x, RUE support  Gait  training with quad cane, 37', step through emphasized   PATIENT EDUCATION:  Education details: PT evaluation, objective findings, POC, Importance of HEP, Precautions, Clinic policies  Person educated: Patient Education method: Explanation and Demonstration Education comprehension: verbalized understanding and returned demonstration  HOME EXERCISE PROGRAM: Access Code: [email or web address] URL: [email or web address] Date: 03/02/2024 Prepared by: Virgia Griffins Powell-Butler  Exercises - Seated Shoulder Flexion AAROM with Hemiparetic UE  - 2 x daily - 7 x weekly - 2 sets - 10 reps - 5 hold - Side to Side Weight Shift with Counter Support  - 2 x daily - 7 x weekly - 2 sets - 10 reps - Sit to Stand with Counter Support  - 2 x daily - 7 x weekly - 2 sets - 10 reps  ASSESSMENT:  CLINICAL IMPRESSION: Session focused on gait training. Education and practice of inc hip and knee flexion, v cueing of step through with RLE, and dec forward placement of QC to limit trunk flexion and improve posture. Pt encouraged to practice during everyday ambulation. During therapeutic activities to target decreasing circumduction, pt knocks over cones consistently initially but with verbal cueing for increasing hip flex, and knee flexion and controlling LE placement through focus, pt is able to perform last set without knocking over cones. Patient performed standing marches against wall to further aid in dec circumduction moment with LLE with support of QC. Patient demo foot clearance of ~6 inch at most during activity. Frequent seated rest breaks needed. Patient will benefit from continued skilled physical therapy in order to address the above deficits to improve function and QOL.    Patient is a 63 y.o. male who was seen today for physical therapy evaluation and treatment for G81.10 (ICD-10-CM) - Spastic hemiplegia affecting nondominant side (HCC). Patient with relevant history of CVA in 2022, affecting L side of body  causing L sided hemiparesis. On this date, patient demonstrates decreased ROM of LUE, generalized weakness L>R, increased tone of L side, and impaired balance/coordination which may contribute to altered gait, decreased functional independence with ADLs/iADLs, and impaired endurance. Patient will benefit from skilled physical therapy in order to address the above to improve QOL and maximize  independence.   OBJECTIVE IMPAIRMENTS: Abnormal gait, decreased activity tolerance, decreased balance, decreased coordination, decreased endurance, decreased mobility, difficulty walking, decreased ROM, decreased strength, hypomobility, impaired sensation, impaired tone, impaired UE functional use, and improper body mechanics.   ACTIVITY LIMITATIONS: carrying, lifting, bending, squatting, stairs, transfers, bed mobility, bathing, toileting, dressing, and hygiene/grooming  PARTICIPATION LIMITATIONS: cleaning, laundry, driving, shopping, community activity, occupation, and yard work  PERSONAL FACTORS: Time since onset of injury/illness/exacerbation are also affecting patient's functional outcome.   REHAB POTENTIAL: Good  CLINICAL DECISION MAKING: Stable/uncomplicated  EVALUATION COMPLEXITY: Moderate   GOALS: Goals reviewed with patient? Yes  SHORT TERM GOALS: Target date: 03/16/24 Patient will be independent with performance of HEP to demonstrate adequate self management of symptoms.  Baseline:  Goal status: INITIAL  2.   Patient will report at least a 25% improvement with function or pain overall since beginning PT. Baseline:  Goal status: INITIAL   LONG TERM GOALS: Target date: 04/13/24  Patient will report at least a 50% improvement with function or pain overall since beginning PT. Baseline:  Goal status: INITIAL  2.  Patient will score at least a 4-/5 on LLE MMT to demonstrate improved LE strength for functional activities.  Baseline:  Goal status: INITIAL  3.  Patient will improve TUG  score by at least 30 seconds to decrease fall risk.  Baseline:  Goal status: INITIAL  4.  Patient will improve 5xSTS score by at least 10 seconds to demonstrate improved LE power for functional activities. Baseline:  Goal status: INITIAL  PLAN:  PT FREQUENCY: 2x/week  PT DURATION: 6 weeks  PLANNED INTERVENTIONS: [id number]- PT Re-evaluation, 97110-Therapeutic exercises, 97530- Therapeutic activity, 97112- Neuromuscular re-education, 97535- Self Care, [id number]- Manual therapy, [phone]- Gait training, Patient/Family education, Balance training, and Joint mobilization  PLAN FOR NEXT SESSION: Progress WB activity of LUE/LLE, strengthening LLE/LUE, balance, quadruped     12:51 PM, 04/06/24 Avina Eberle Powell-Butler, PT, DPT [facility]

## 2024-04-11 ENCOUNTER — Encounter (HOSPITAL_COMMUNITY)

## 2024-04-13 ENCOUNTER — Encounter (HOSPITAL_COMMUNITY): Payer: Self-pay

## 2024-04-13 ENCOUNTER — Ambulatory Visit (HOSPITAL_COMMUNITY)

## 2024-04-13 DIAGNOSIS — M6281 Muscle weakness (generalized): Secondary | ICD-10-CM

## 2024-04-13 DIAGNOSIS — Z7409 Other reduced mobility: Secondary | ICD-10-CM | POA: Diagnosis not present

## 2024-04-13 DIAGNOSIS — R2689 Other abnormalities of gait and mobility: Secondary | ICD-10-CM | POA: Diagnosis not present

## 2024-04-13 DIAGNOSIS — G8114 Spastic hemiplegia affecting left nondominant side: Secondary | ICD-10-CM

## 2024-04-13 DIAGNOSIS — R262 Difficulty in walking, not elsewhere classified: Secondary | ICD-10-CM | POA: Diagnosis not present

## 2024-04-13 NOTE — Therapy (Signed)
 OUTPATIENT PHYSICAL THERAPY LOWER EXTREMITY TREATMENT   Patient Name: Matthew Wise MRN: [id number] DOB:[birth year], 63 y.o., male Today's Date: 04/13/2024  Progress Note Reporting Period 03/02/24 to 04/13/24  See note below for Objective Data and Assessment of Progress/Goals.      END OF SESSION:  PT End of Session - 04/13/24 1156     Visit Number 7    Number of Visits 12    Date for PT Re-Evaluation 05/11/24    Authorization Type [employer] Medicare Dual Complete    Authorization Time Period No auth needed    Progress Note Due on Visit 10    PT Start Time 1150    PT Stop Time 1230    PT Time Calculation (min) 40 min    Equipment Utilized During Treatment Gait belt    Activity Tolerance Patient tolerated treatment well    Behavior During Therapy WFL for tasks assessed/performed;Flat affect              Past Medical History:  Diagnosis Date   Acute CVA (cerebrovascular accident) (HCC) 09/09/2020   Chronic anticoagulation 11/14/2014   CKD (chronic kidney disease) stage 3, GFR 30-59 ml/min (HCC) 10/24/2019   CKD (chronic kidney disease) stage 3, GFR 30-59 ml/min (HCC) 10/24/2019   Clotting disorder (HCC)    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 two simple tubular adenomas removed. patient was advised to go to [facility] for colonoscopy but he did not keep appt.   IR CT HEAD LTD  02/02/2021   IR PERCUTANEOUS ART THROMBECTOMY/INFUSION INTRACRANIAL INC DIAG ANGIO  02/02/2021   RADIOLOGY WITH ANESTHESIA N/A 02/02/2021   Procedure: IR WITH ANESTHESIA;  Surgeon: Luellen Sages, MD;  Location: [facility] OR;  Service: Radiology;  Laterality: N/A;   TEE WITHOUT CARDIOVERSION N/A 09/10/2020   Procedure: TRANSESOPHAGEAL ECHOCARDIOGRAM (TEE) WITH PROPOFOL ;  Surgeon: Gerard Knight, MD;  Location: [facility];  Service: Cardiovascular;  Laterality: N/A;   XI ROBOTIC LAPAROSCOPIC ASSISTED APPENDECTOMY N/A 10/09/2023   Procedure: XI ROBOTIC LAPAROSCOPIC ASSISTED APPENDECTOMY;  Surgeon: Marijo Shove, DO;  Location: [facility];  Service: General;  Laterality: N/A;   Patient Active Problem List   Diagnosis Date Noted   Postoperative ileus (HCC) 10/13/2023   Acute pancreatitis 10/08/2023   Skin abscess 10/01/2023   Acute non-recurrent sinusitis 01/15/2023   Gastroesophageal reflux disease 01/15/2023   Seizure with onset of stroke (HCC) 01/15/2023   Encounter for general adult medical examination with abnormal findings 07/06/2022   Stage 3a chronic kidney disease (HCC) 07/06/2022   Hospital discharge follow-up 04/30/2022   Obesity, Class I, BMI 30-34.9 04/25/2022   Generalized weakness    Near syncope    Non-seasonal allergic rhinitis 03/25/2022   Encounter for examination following treatment at hospital 02/27/2022  BPH (benign prostatic hyperplasia) 09/24/2021   History of CVA with residual deficit 09/24/2021   Dysphagia, post-stroke    Spastic hemiplegia affecting nondominant side (HCC)    Hemiparesis affecting left side as late effect of stroke (HCC)    Thyroid  nodule 01/25/2021   Hypothyroidism 01/25/2021   H/O adenomatous polyp of colon 12/30/2020   Essential hypertension 11/28/2020   Mixed hyperlipidemia 11/28/2020   H/O  medication noncompliance 09/07/2020   Nicotine  dependence 12/05/2019   Prediabetes 10/24/2019   Tubular adenoma of colon 09/14/2017   Long term (current) use of anticoagulants 11/14/2014   Sickle cell trait (HCC) 08/29/2012   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  Patient reports history of CVA in 2022. Reports he received rehab in hospital, then home health, then outpatient PT here. After discharge from here, reports he got more home health PT. Reports he's still walking around home. Reports he's back  because he feels he needs more. Has an Aid that lives with him. Reports mod assist from her for ADLs. Max A for iADLs. LBQC use and w/c at home. Doesn't use w/c. Sometimes takes it to Dr visits.   PERTINENT HISTORY: CVA in 2022 Recurrent Wounds Clotting disorder DVT/PE PAIN:  Are you having pain? No  PRECAUTIONS: None  RED FLAGS: Bowel or bladder incontinence: No   WEIGHT BEARING RESTRICTIONS: No  FALLS:  Has patient fallen in last 6 months? No  LIVING ENVIRONMENT: Lives with: lives with their family Health care aid and daughter sometimes visits Lives in: House/apartment Stairs: No Has following equipment at home: Chiropractor, Wheelchair (manual), and Ramped entry  OCCUPATION: N/A. Disability  PLOF: Independent prior to CVA  PATIENT GOALS: Like to get strength back in Leg, and use of L arm  and  do more for himself   NEXT MD VISIT: [name], may be in June  OBJECTIVE:  Note: Objective measures were completed at Evaluation unless otherwise noted.  PATIENT SURVEYS:    COGNITION: Overall cognitive status: Within functional limits for tasks assessed     SENSATION: Light touch: Impaired   Dec sensation t/o LLE, all dermatome levels.     Mild impairment in LUE  POSTURE: rounded shoulders, decreased lumbar lordosis, increased thoracic kyphosis, and flexed trunk   PALPATION: NT  LOWER EXTREMITY ROM:  Active ROM Right eval Left eval  Hip flexion    Hip extension    Hip abduction    Hip adduction    Hip internal rotation    Hip external rotation    Knee flexion    Knee extension    Ankle dorsiflexion    Ankle plantarflexion    Ankle inversion    Ankle eversion     (Blank rows = not tested)  LEFT UPPER EXTREMITY: Modified Ashworth level 2 2 (2) - More marked increase in tone but limb easily flexed.  RUE: shoulder flexion ROM WFL, 4+/5 MMT Minimal activation of L hand musculature, pt unable to open/close voluntarily.  LOWER EXTREMITY  MMT:  MMT Right eval Left eval R 04/13/24 L 04/13/24  Hip flexion 4- 3 4- 3  Hip extension      Hip abduction      Hip adduction      Hip internal rotation      Hip external rotation      Knee flexion 4- 3 4- 3  Knee extension 4- 3 4- 3  Ankle dorsiflexion      Ankle plantarflexion      Ankle inversion      Ankle eversion       (Blank rows = not tested)   FUNCTIONAL TESTS:  5 times sit to stand: 37 Sec Timed up and go (TUG): 1 min 13 sec  All w/ use of armrests, and LBQA for stability once standing, CGA for safety    04/13/24:   5xTSS: 31 sec, use of RUE to push up TUG: 1 min 22 sec, w/ LBQC, inc step length with RLE  GAIT: Distance walked: 75 Assistive device utilized: Quad cane large base Level of assistance: Modified independence and SBA Comments: Pt ambulates with LBQC w/ extremely dec velocity, dec arm swing  with LUE (shoulder abd, IR, elbow flexed, pronated, wrist flexed fingers flexed), forward lean, rounded shoulders, dec trunk rotation, step to pattern, dec knee flexion in LLE, dec heel strike in L foot, dec step lengths bilat, dec hip ext bilat.                                                                                                                                TREATMENT DATE:  04/13/24: Gait training, 63", v cues for step through with RLE Passive stretching to LUE into shoulder flexion, abduction, and elbow extension, pt in supine AAROM w/ dowel: 10x2 MMT 5xSTS TUG Gait Training, ~40', v cues for step length, LBQC placement and pt holding 2 lb dumbbell in LUE for prolonged stretch, PT assisting  04/06/24: -Gait training, v cues for inc hip/knee flexion with LLE, step through with RLE, and decreasing distance of QC placement to limit trunk forward flexion  73ft-->30ft-->20ft  -Ther Act: in // bars, Cones placed on L side, pt stepping over flat obstacle on ground, goal to step over obstacle and back while avoiding knocking over cones to reduce circumduction, 8x -Ther Act: pt standing against wall with L side of body, v cues for upright posture, marches with LLE, support with QC, 2x10 -STS: 5x t/o session, initially required mod A-->CGA-->mod(I)   04/04/24:  QC Gait training 25ft with CGA Weight shifting with UE on mat with Lt hand on small ball forward/backward with assistance for hand and wrist extension Sidelying: clam Supine: Bridge with ball between knees 10x 5" Standing: Passive stretching of LUE and hand, pt standing at mat table, PT support at elbow, and providing input to L hand, pt reports pain with pressure Standing Hamstring curl, 10x each leg Standing hip abduction, 10x, v cues for control  PATIENT EDUCATION:  Education details: PT evaluation, objective findings, POC, Importance of HEP, Precautions, Clinic policies  Person educated: Patient Education method:  Explanation and Demonstration Education comprehension: verbalized understanding and returned demonstration  HOME EXERCISE PROGRAM: Access Code: [email or web address] URL: [email or web address] Date: 03/02/2024 Prepared by: Virgia Griffins Powell-Butler  Exercises - Seated Shoulder Flexion AAROM with Hemiparetic UE  - 2 x daily - 7 x weekly - 2 sets - 10 reps - 5 hold - Side to Side Weight Shift with Counter Support  - 2 x daily - 7 x weekly - 2 sets - 10 reps - Sit to Stand with Counter Support  - 2 x daily - 7 x weekly - 2 sets - 10 reps  ASSESSMENT:  CLINICAL IMPRESSION: Session began with gait training, giving pt verbal cues for increasing RLE for step through pattern. Patient with good carryover of decreasing distance of LBQC placement when advancing AD. Followed with therapist giving passive stretching to LUE in flexion, abduction and elbow extension to patient tolerance. Remainder of session spent administering 5xSTS and TUG tests as progress note day. Patient demonstrating improvement with 5xSTS. TUG time increased but better mechanics noted t/o. More gait training at end of session with notable decrease in mechanics/form with inc fatigue, requiring more verbal cueing. Less frequent breaks this session. Patient will benefit from continued skilled physical therapy in order to address the above deficits to improve function and QOL.   Patient is a 63 y.o. male who was seen today for physical therapy evaluation and treatment for G81.10 (ICD-10-CM) - Spastic hemiplegia affecting nondominant side (HCC). Patient with relevant history of CVA in 2022, affecting L side of body causing L sided hemiparesis. On this date, patient demonstrates decreased ROM of LUE, generalized weakness L>R, increased tone of L side, and impaired balance/coordination which may contribute to altered gait, decreased functional independence with ADLs/iADLs, and impaired endurance. Patient will benefit from skilled physical therapy in  order to address the above to improve QOL and maximize  independence.   OBJECTIVE IMPAIRMENTS: Abnormal gait, decreased activity tolerance, decreased balance, decreased coordination, decreased endurance, decreased mobility, difficulty walking, decreased ROM, decreased strength, hypomobility, impaired sensation, impaired tone, impaired UE functional use, and improper body mechanics.   ACTIVITY LIMITATIONS: carrying, lifting, bending, squatting, stairs, transfers, bed mobility, bathing, toileting, dressing, and hygiene/grooming  PARTICIPATION LIMITATIONS: cleaning, laundry, driving, shopping, community activity, occupation, and yard work  PERSONAL FACTORS: Time since onset of injury/illness/exacerbation are also affecting patient's functional outcome.   REHAB POTENTIAL: Good  CLINICAL DECISION MAKING: Stable/uncomplicated  EVALUATION COMPLEXITY: Moderate   GOALS: Goals reviewed with patient? Yes  SHORT TERM GOALS: Target date: 04/27/24 Patient will be independent with performance of HEP to demonstrate adequate self management of symptoms.  Baseline:  Goal status: IN PROGRESS  2.   Patient will report at least a 25% improvement with function or pain overall since beginning PT. Baseline:  Goal status: IN PROGRESS   LONG TERM GOALS: Target date: 05/25/24  Patient will report at least a 50% improvement with function or pain overall since beginning PT. Baseline:  Goal status: IN PROGRESS  2.  Patient will score at least a 4-/5 on LLE MMT to demonstrate improved LE strength for functional activities.  Baseline:  Goal status: IN PROGRESS  3.  Patient will improve TUG score by at least 30 seconds to decrease fall risk.  Baseline:  Goal status: IN PROGRESS  4.  Patient will improve 5xSTS score by at least 10 seconds to demonstrate improved LE  power for functional activities. Baseline:  Goal status: IN PROGRESS  PLAN:  PT FREQUENCY: 2x/week  PT DURATION: 6 weeks  PLANNED  INTERVENTIONS: 97164- PT Re-evaluation, 97110-Therapeutic exercises, 97530- Therapeutic activity, 97112- Neuromuscular re-education, 97535- Self Care, [id number]- Manual therapy, [phone]- Gait training, Patient/Family education, Balance training, and Joint mobilization  PLAN FOR NEXT SESSION: Progress WB activity of LUE/LLE, strengthening LLE/LUE, balance, quadruped  Manual stretching to LUE/LLE   12:42 PM, 04/13/24 Robert Sunga Powell-Butler, PT, DPT [facility]

## 2024-04-18 ENCOUNTER — Encounter (HOSPITAL_COMMUNITY): Payer: Self-pay

## 2024-04-18 ENCOUNTER — Ambulatory Visit (HOSPITAL_COMMUNITY)

## 2024-04-18 DIAGNOSIS — R2689 Other abnormalities of gait and mobility: Secondary | ICD-10-CM | POA: Diagnosis not present

## 2024-04-18 DIAGNOSIS — M6281 Muscle weakness (generalized): Secondary | ICD-10-CM

## 2024-04-18 DIAGNOSIS — Z7409 Other reduced mobility: Secondary | ICD-10-CM | POA: Diagnosis not present

## 2024-04-18 DIAGNOSIS — G8114 Spastic hemiplegia affecting left nondominant side: Secondary | ICD-10-CM

## 2024-04-18 DIAGNOSIS — R262 Difficulty in walking, not elsewhere classified: Secondary | ICD-10-CM | POA: Diagnosis not present

## 2024-04-18 NOTE — Therapy (Signed)
 OUTPATIENT PHYSICAL THERAPY LOWER EXTREMITY TREATMENT   Patient Name: Matthew Wise MRN: [id number] DOB:[birth year], 63 y.o., male Today's Date: 04/18/2024    END OF SESSION:  PT End of Session - 04/18/24 1144     Visit Number 8    Number of Visits 12    Date for PT Re-Evaluation 05/11/24    Authorization Type [employer] Medicare Dual Complete    Authorization Time Period No auth needed    Progress Note Due on Visit 10    PT Start Time 1145    PT Stop Time 1240    PT Time Calculation (min) 55 min    Equipment Utilized During Treatment Gait belt    Activity Tolerance Patient tolerated treatment well;Patient limited by fatigue    Behavior During Therapy WFL for tasks assessed/performed;Flat affect              Past Medical History:  Diagnosis Date   Acute CVA (cerebrovascular accident) (HCC) 09/09/2020   Chronic anticoagulation 11/14/2014   CKD (chronic kidney disease) stage 3, GFR 30-59 ml/min (HCC) 10/24/2019   CKD (chronic kidney disease) stage 3, GFR 30-59 ml/min (HCC) 10/24/2019   Clotting disorder (HCC)    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 two simple tubular adenomas removed. patient was advised to go to [facility] for  colonoscopy but he did not keep appt.   IR CT HEAD LTD  02/02/2021   IR PERCUTANEOUS ART THROMBECTOMY/INFUSION INTRACRANIAL INC DIAG ANGIO  02/02/2021   RADIOLOGY WITH ANESTHESIA N/A 02/02/2021   Procedure: IR WITH ANESTHESIA;  Surgeon: Matthew Sages, MD;  Location: [facility] OR;  Service: Radiology;  Laterality: N/A;   TEE WITHOUT CARDIOVERSION N/A 09/10/2020   Procedure: TRANSESOPHAGEAL ECHOCARDIOGRAM (TEE) WITH PROPOFOL ;  Surgeon: Matthew Knight, MD;  Location: [facility] ORS;  Service: Cardiovascular;  Laterality: N/A;   XI ROBOTIC LAPAROSCOPIC ASSISTED APPENDECTOMY N/A 10/09/2023   Procedure: XI ROBOTIC LAPAROSCOPIC ASSISTED APPENDECTOMY;  Surgeon: Matthew Shove, DO;  Location: [facility] ORS;  Service: General;  Laterality: N/A;   Patient Active Problem List   Diagnosis Date Noted   Postoperative ileus (HCC) 10/13/2023   Acute pancreatitis 10/08/2023   Skin abscess 10/01/2023   Acute non-recurrent sinusitis 01/15/2023   Gastroesophageal reflux disease 01/15/2023   Seizure with onset of stroke (HCC) 01/15/2023   Encounter for general adult medical examination with abnormal findings 07/06/2022   Stage 3a chronic kidney disease (HCC) 07/06/2022   Hospital discharge follow-up 04/30/2022   Obesity, Class I, BMI 30-34.9 04/25/2022   Generalized weakness    Near syncope    Non-seasonal allergic rhinitis 03/25/2022   Encounter for examination following treatment at hospital 02/27/2022   BPH (benign prostatic hyperplasia) 09/24/2021   History of CVA with residual deficit 09/24/2021  Dysphagia, post-stroke    Spastic hemiplegia affecting nondominant side (HCC)    Hemiparesis affecting left side as late effect of stroke (HCC)    Thyroid  nodule 01/25/2021   Hypothyroidism 01/25/2021   H/O adenomatous polyp of colon 12/30/2020   Essential hypertension 11/28/2020   Mixed hyperlipidemia 11/28/2020   H/O medication noncompliance 09/07/2020   Nicotine  dependence 12/05/2019   Prediabetes 10/24/2019    Tubular adenoma of colon 09/14/2017   Long term (current) use of anticoagulants 11/14/2014   Sickle cell trait (HCC) 08/29/2012   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  Has been walking around the house.  Patient reports history of CVA in 2022. Reports he received rehab in hospital, then home health, then outpatient PT here. After discharge from here, reports he got more home health PT. Reports he's still walking around home. Reports he's back  because he feels he needs more. Has an Aid that lives with him. Reports mod assist from her for ADLs. Max A for iADLs. LBQC use and w/c at home. Doesn't use w/c. Sometimes takes it to Dr visits.   PERTINENT HISTORY: CVA in 2022 Recurrent Wounds Clotting disorder DVT/PE PAIN:  Are you having pain? No  PRECAUTIONS: None  RED FLAGS: Bowel or bladder incontinence: No   WEIGHT BEARING RESTRICTIONS: No  FALLS:  Has patient fallen in last 6 months? No  LIVING ENVIRONMENT: Lives with: lives with their family Health care aid and daughter sometimes visits Lives in: House/apartment Stairs: No Has following equipment at home: Chiropractor, Wheelchair (manual), and Ramped entry  OCCUPATION: N/A. Disability  PLOF: Independent prior to CVA  PATIENT GOALS: Like to get strength back in Leg, and use of L arm  and do more for himself   NEXT MD VISIT:  Matthew Wise, may be in June  OBJECTIVE:  Note: Objective measures were completed at Evaluation unless otherwise noted.  PATIENT SURVEYS:    COGNITION: Overall cognitive status: Within functional limits for tasks assessed     SENSATION: Light touch: Impaired   Dec sensation t/o LLE, all dermatome levels.     Mild impairment in LUE  POSTURE: rounded shoulders, decreased lumbar lordosis, increased thoracic kyphosis, and flexed trunk   PALPATION: NT  LOWER EXTREMITY ROM:  Active ROM Right eval Left eval  Hip flexion    Hip extension    Hip abduction    Hip adduction    Hip internal rotation    Hip external rotation    Knee flexion    Knee extension    Ankle dorsiflexion    Ankle plantarflexion    Ankle inversion    Ankle eversion     (Blank rows = not tested)  LEFT UPPER EXTREMITY: Modified [place] level 2 2 (2) - More marked increase in tone but limb easily flexed.  RUE: shoulder flexion ROM WFL, 4+/5 MMT Minimal activation of L hand musculature, pt unable to open/close voluntarily.  LOWER EXTREMITY MMT:  MMT Right eval Left eval R 04/13/24  L 04/13/24  Hip flexion 4- 3 4- 3  Hip extension      Hip abduction      Hip adduction      Hip internal rotation      Hip external rotation      Knee flexion 4- 3 4- 3  Knee extension 4- 3 4- 3  Ankle dorsiflexion      Ankle plantarflexion      Ankle inversion      Ankle eversion       (Blank rows = not tested)   FUNCTIONAL TESTS:  5 times sit to stand: 37 Sec Timed up and go (TUG): 1 min 13 sec  All w/ use of armrests, and LBQA for stability once standing, CGA for safety    04/13/24:   5xTSS: 31 sec, use of RUE to push up TUG: 1 min 22 sec, w/ LBQC, inc step length with RLE  GAIT: Distance walked: 75 Assistive device utilized: Quad cane large base Level of assistance: Modified independence and SBA Comments: Pt ambulates with LBQC w/ extremely dec velocity, dec arm swing with LUE (shoulder abd, IR, elbow  flexed, pronated, wrist flexed fingers flexed), forward lean, rounded shoulders, dec trunk rotation, step to pattern, dec knee flexion in LLE, dec heel strike in L foot, dec step lengths bilat, dec hip ext bilat.                                                                                                                                TREATMENT DATE:  04/18/24: Gait training 2 sets with [facility] Nustep [place] x5" with therapist assistance with Lt UE Sidestep front of mat 2RT 5STS Squats front of mat 2x 5 Standing in // bars Cones placed on L side, pt stepping over flat obstacle on ground, goal to step over obstacle and back while avoiding knocking over cones to reduce circumduction, 8x  Manual: Passive stretching to LUE into shoulder flexion, abduction, and elbow extension  04/13/24: Gait training, 63", v cues for step through with RLE Passive stretching to LUE into shoulder flexion, abduction, and elbow extension, pt in supine AAROM w/ dowel: 10x2 MMT 5xSTS TUG Gait Training, ~40', v cues for step length, LBQC placement and pt holding 2 lb dumbbell in LUE for prolonged stretch, PT assisting  04/06/24: -Gait training, v cues for inc hip/knee flexion with LLE, step through with RLE, and decreasing distance of QC placement to limit trunk forward flexion  64ft-->30ft-->20ft  -Ther Act: in // bars, Cones placed on L side, pt stepping over flat obstacle on ground, goal to step over obstacle and back while avoiding knocking over cones to reduce circumduction, 8x -Ther Act: pt standing against wall with L side of body, v cues for upright posture, marches with LLE, support with QC, 2x10 -STS: 5x t/o session, initially required mod A-->CGA-->mod(I)   04/04/24:  QC Gait training 78ft with CGA Weight shifting with UE on  mat with Lt hand on small ball forward/backward with assistance for hand and wrist extension Sidelying: clam Supine: Bridge with ball between knees 10x  5" Standing: Passive stretching of LUE and hand, pt standing at mat table, PT support at elbow, and providing input to L hand, pt reports pain with pressure Standing Hamstring curl, 10x each leg Standing hip abduction, 10x, v cues for control   PATIENT EDUCATION:  Education details: PT evaluation, objective findings, POC, Importance of HEP, Precautions, Clinic policies  Person educated: Patient Education method: Explanation and Demonstration Education comprehension: verbalized understanding and returned demonstration  HOME EXERCISE PROGRAM: Access Code: [email or web address] URL: [email or web address] Date: 03/02/2024 Prepared by: Virgia Griffins Powell-Butler  Exercises - Seated Shoulder Flexion AAROM with Hemiparetic UE  - 2 x daily - 7 x weekly - 2 sets - 10 reps - 5 hold - Side to Side Weight Shift with Counter Support  - 2 x daily - 7 x weekly - 2 sets - 10 reps - Sit to Stand with Counter Support  - 2 x daily - 7 x weekly - 2 sets - 10 reps  ASSESSMENT:  CLINICAL IMPRESSION: Session focus with gait training and balance.  Began session on Nustep for activitiy tolerance and coordination, cueing for consistent speed.  Therapist assist with Lt LE ER and Lt UE movements while on Nustep.  Worked on gluteal strengthening to improve STS time and balance training with mod A for safety without HHA.  Pt limited activity tolerance through session, required seated rest breaks following short duration.  Patient is a 63 y.o. male who was seen today for physical therapy evaluation and treatment for G81.10 (ICD-10-CM) - Spastic hemiplegia affecting nondominant side (HCC). Patient with relevant history of CVA in 2022, affecting L side of body causing L sided hemiparesis. On this date, patient demonstrates decreased ROM of LUE, generalized weakness L>R, increased tone of L side, and impaired balance/coordination which may contribute to altered gait, decreased functional independence with ADLs/iADLs, and  impaired endurance. Patient will benefit from skilled physical therapy in order to address the above to improve QOL and maximize  independence.   OBJECTIVE IMPAIRMENTS: Abnormal gait, decreased activity tolerance, decreased balance, decreased coordination, decreased endurance, decreased mobility, difficulty walking, decreased ROM, decreased strength, hypomobility, impaired sensation, impaired tone, impaired UE functional use, and improper body mechanics.   ACTIVITY LIMITATIONS: carrying, lifting, bending, squatting, stairs, transfers, bed mobility, bathing, toileting, dressing, and hygiene/grooming  PARTICIPATION LIMITATIONS: cleaning, laundry, driving, shopping, community activity, occupation, and yard work  PERSONAL FACTORS: Time since onset of injury/illness/exacerbation are also affecting patient's functional outcome.   REHAB POTENTIAL: Good  CLINICAL DECISION MAKING: Stable/uncomplicated  EVALUATION COMPLEXITY: Moderate   GOALS: Goals reviewed with patient? Yes  SHORT TERM GOALS: Target date: 04/27/24 Patient will be independent with performance of HEP to demonstrate adequate self management of symptoms.  Baseline:  Goal status: IN PROGRESS  2.   Patient will report at least a 25% improvement with function or pain overall since beginning PT. Baseline:  Goal status: IN PROGRESS   LONG TERM GOALS: Target date: 05/25/24  Patient will report at least a 50% improvement with function or pain overall since beginning PT. Baseline:  Goal status: IN PROGRESS  2.  Patient will score at least a 4-/5 on LLE MMT to demonstrate improved LE strength for functional activities.  Baseline:  Goal status: IN PROGRESS  3.  Patient will improve TUG score by at least 30 seconds to decrease fall risk.  Baseline:  Goal status:  IN PROGRESS  4.  Patient will improve 5xSTS score by at least 10 seconds to demonstrate improved LE power for functional activities. Baseline:  Goal status: IN  PROGRESS  PLAN:  PT FREQUENCY: 2x/week  PT DURATION: 6 weeks  PLANNED INTERVENTIONS: 97164- PT Re-evaluation, 97110-Therapeutic exercises, 97530- Therapeutic activity, 97112- Neuromuscular re-education, 97535- Self Care, [id number]- Manual therapy, [phone]- Gait training, Patient/Family education, Balance training, and Joint mobilization  PLAN FOR NEXT SESSION: Progress WB activity of LUE/LLE, strengthening LLE/LUE, balance, quadruped  Manual stretching to LUE/LLE  Minor Amble, LPTA/CLT; Johnye Napoleon [phone]  4:43 PM, 04/18/24

## 2024-04-20 ENCOUNTER — Encounter (HOSPITAL_COMMUNITY)

## 2024-04-25 ENCOUNTER — Encounter (HOSPITAL_COMMUNITY): Payer: Self-pay

## 2024-04-25 ENCOUNTER — Ambulatory Visit (HOSPITAL_COMMUNITY)

## 2024-04-25 DIAGNOSIS — M6281 Muscle weakness (generalized): Secondary | ICD-10-CM | POA: Diagnosis not present

## 2024-04-25 DIAGNOSIS — R2689 Other abnormalities of gait and mobility: Secondary | ICD-10-CM

## 2024-04-25 DIAGNOSIS — G8114 Spastic hemiplegia affecting left nondominant side: Secondary | ICD-10-CM

## 2024-04-25 DIAGNOSIS — R262 Difficulty in walking, not elsewhere classified: Secondary | ICD-10-CM | POA: Diagnosis not present

## 2024-04-25 DIAGNOSIS — Z7409 Other reduced mobility: Secondary | ICD-10-CM

## 2024-04-25 NOTE — Therapy (Signed)
 OUTPATIENT PHYSICAL THERAPY LOWER EXTREMITY TREATMENT   Patient Name: Matthew Wise MRN: [id number] DOB:Feb 12, 1961, 63 y.o., male Today's Date: 04/25/2024    END OF SESSION:  PT End of Session - 04/25/24 1259     Visit Number 9    Number of Visits 12    Date for PT Re-Evaluation 05/11/24    Authorization Type [employer] Medicare Dual Complete    Authorization Time Period No auth needed    Progress Note Due on Visit 17   PN complete visit #7   PT Start Time 1301    PT Stop Time 1351    PT Time Calculation (min) 50 min    Equipment Utilized During Treatment Gait belt    Activity Tolerance Patient tolerated treatment well;Patient limited by fatigue    Behavior During Therapy WFL for tasks assessed/performed;Flat affect              Past Medical History:  Diagnosis Date   Acute CVA (cerebrovascular accident) (HCC) 09/09/2020   Chronic anticoagulation 11/14/2014   CKD (chronic kidney disease) stage 3, GFR 30-59 ml/min (HCC) 10/24/2019   CKD (chronic kidney disease) stage 3, GFR 30-59 ml/min (HCC) 10/24/2019   Clotting disorder (HCC)    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 two simple tubular adenomas removed. patient was advised to  go to [facility] for colonoscopy but he did not keep appt.   IR CT HEAD LTD  02/02/2021   IR PERCUTANEOUS ART THROMBECTOMY/INFUSION INTRACRANIAL INC DIAG ANGIO  02/02/2021   RADIOLOGY WITH ANESTHESIA N/A 02/02/2021   Procedure: IR WITH ANESTHESIA;  Surgeon: Luellen Sages, MD;  Location: [facility] OR;  Service: Radiology;  Laterality: N/A;   TEE WITHOUT CARDIOVERSION N/A 09/10/2020   Procedure: TRANSESOPHAGEAL ECHOCARDIOGRAM (TEE) WITH PROPOFOL ;  Surgeon: Gerard Knight, MD;  Location: [facility];  Service: Cardiovascular;  Laterality: N/A;   XI ROBOTIC LAPAROSCOPIC ASSISTED APPENDECTOMY N/A 10/09/2023   Procedure: XI ROBOTIC LAPAROSCOPIC ASSISTED APPENDECTOMY;  Surgeon: Marijo Shove, DO;  Location: [facility];  Service: General;  Laterality: N/A;   Patient Active Problem List   Diagnosis Date Noted   Postoperative ileus (HCC) 10/13/2023   Acute pancreatitis 10/08/2023   Skin abscess 10/01/2023   Acute non-recurrent sinusitis 01/15/2023   Gastroesophageal reflux disease 01/15/2023   Seizure with onset of stroke (HCC) 01/15/2023   Encounter for general adult medical examination with abnormal findings 07/06/2022   Stage 3a chronic kidney disease (HCC) 07/06/2022   Hospital discharge follow-up 04/30/2022   Obesity, Class I, BMI 30-34.9 04/25/2022   Generalized weakness    Near syncope    Non-seasonal allergic rhinitis 03/25/2022   Encounter for examination following treatment at hospital 02/27/2022   BPH (benign prostatic hyperplasia) 09/24/2021   History of CVA with  residual deficit 09/24/2021   Dysphagia, post-stroke    Spastic hemiplegia affecting nondominant side (HCC)    Hemiparesis affecting left side as late effect of stroke (HCC)    Thyroid  nodule 01/25/2021   Hypothyroidism 01/25/2021   H/O adenomatous polyp of colon 12/30/2020   Essential hypertension 11/28/2020   Mixed hyperlipidemia 11/28/2020   H/O medication noncompliance 09/07/2020   Nicotine  dependence 12/05/2019    Prediabetes 10/24/2019   Tubular adenoma of colon 09/14/2017   Long term (current) use of anticoagulants 11/14/2014   Sickle cell trait (HCC) 08/29/2012   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    PCP: Meldon Sport, MD  REFERRING PROVIDER: Meldon Sport, MD  REFERRING DIAG: G81.10 (ICD-10-CM) - Spastic hemiplegia affecting nondominant side (HCC)  THERAPY DIAG:  Muscle weakness (generalized)  Spastic hemiplegia affecting left nondominant side, unspecified etiology (HCC)  Impaired functional mobility, balance, gait, and endurance  Other abnormalities of gait and mobility  Difficulty in walking, not elsewhere classified  Rationale for Evaluation and Treatment: Rehabilitation  ONSET DATE: 2022 Rehab, at home health, 2023, home health    SUBJECTIVE:   SUBJECTIVE STATEMENT: Lt knee has some arthritic pain today, pain scale low 1/10.  Patient reports history of CVA in 2022. Reports he received rehab in hospital, then home health, then outpatient PT here. After discharge from here, reports he got more home health PT. Reports he's still walking around home. Reports he's back  because he feels he needs more. Has an Aid that lives with him. Reports mod assist from her for ADLs. Max A for iADLs. LBQC use and w/c at home. Doesn't use w/c. Sometimes takes it to Dr visits.   PERTINENT HISTORY: CVA in 2022 Recurrent Wounds Clotting disorder DVT/PE PAIN:  Are you having pain? No  PRECAUTIONS: None  RED FLAGS: Bowel or bladder incontinence: No   WEIGHT BEARING RESTRICTIONS: No  FALLS:  Has patient fallen in last 6 months? No  LIVING ENVIRONMENT: Lives with: lives with their family Health care aid and daughter sometimes visits Lives in: House/apartment Stairs: No Has following equipment at home: Chiropractor, Wheelchair (manual), and Ramped entry  OCCUPATION: N/A. Disability  PLOF: Independent prior to CVA  PATIENT GOALS: Like to get strength  back in Leg, and use of L arm  and do more for himself   NEXT MD VISIT: [name], may be in June  OBJECTIVE:  Note: Objective measures were completed at Evaluation unless otherwise noted.  PATIENT SURVEYS:    COGNITION: Overall cognitive status: Within functional limits for tasks assessed     SENSATION: Light touch: Impaired   Dec sensation t/o LLE, all dermatome levels.     Mild impairment in LUE  POSTURE: rounded shoulders, decreased lumbar lordosis, increased thoracic kyphosis, and flexed trunk   PALPATION: NT  LOWER EXTREMITY ROM:  Active ROM Right eval Left eval  Hip flexion    Hip extension    Hip abduction    Hip adduction    Hip internal rotation    Hip external rotation    Knee flexion    Knee extension    Ankle dorsiflexion    Ankle plantarflexion    Ankle inversion    Ankle eversion     (Blank rows = not tested)  LEFT UPPER EXTREMITY: Modified [place] level 2 2 (2) - More marked increase in tone but limb easily flexed.  RUE: shoulder flexion ROM WFL, 4+/5 MMT Minimal activation of L hand musculature, pt unable to  open/close voluntarily.  LOWER EXTREMITY MMT:  MMT Right eval Left eval R 04/13/24 L 04/13/24  Hip flexion 4- 3 4- 3  Hip extension      Hip abduction      Hip adduction      Hip internal rotation      Hip external rotation      Knee flexion 4- 3 4- 3  Knee extension 4- 3 4- 3  Ankle dorsiflexion      Ankle plantarflexion      Ankle inversion      Ankle eversion       (Blank rows = not tested)   FUNCTIONAL TESTS:  5 times sit to stand: 37 Sec Timed up and go (TUG): 1 min 13 sec  All w/ use of armrests, and LBQA for stability once standing, CGA for safety    04/13/24:   5xTSS: 31 sec, use of RUE to push up TUG: 1 min 22 sec, w/ LBQC, inc step length with RLE  04/25/24: 67ft with QC  GAIT: Distance walked: 75 Assistive device utilized: Quad cane large base Level of assistance: Modified independence and SBA Comments:  Pt ambulates with LBQC w/ extremely dec velocity, dec arm swing with LUE (shoulder abd, IR, elbow flexed, pronated, wrist flexed fingers flexed), forward lean, rounded shoulders, dec trunk rotation, step to pattern, dec knee flexion in LLE, dec heel strike in L foot, dec step lengths bilat, dec hip ext bilat.                                                                                                                                TREATMENT DATE:  04/25/24: 69ft with LBQC step to pattern 5 STS x2 sets with Rt UE push off Small obstacle step over 3 bars 2RT inside //bars forward Sidestep with theraband around thighs 2RT Squat front of chair 2x 5 Partial tandem stance 2x 20"   04/18/24: Gait training 2 sets with [facility] [place] x5" with therapist assistance with Lt UE Sidestep front of mat 2RT 5STS Squats front of mat 2x 5 Standing in // bars Cones placed on L side, pt stepping over flat obstacle on ground, goal to step over obstacle and back while avoiding knocking over cones to reduce circumduction, 8x  Manual: Passive stretching to LUE into shoulder flexion, abduction, and elbow extension  04/13/24: Gait training, 63", v cues for step through with RLE Passive stretching to LUE into shoulder flexion, abduction, and elbow extension, pt in supine AAROM w/ dowel: 10x2 MMT 5xSTS TUG Gait Training, ~40', v cues for step length, LBQC placement and pt holding 2 lb dumbbell in LUE for prolonged stretch, PT assisting  04/06/24: -Gait training, v cues for inc hip/knee flexion with LLE, step through with RLE, and decreasing distance of QC placement to limit trunk forward flexion  89ft-->30ft-->20ft  -Ther Act: in // bars, Cones placed on L side, pt stepping over  flat obstacle on ground, goal to step over obstacle and back while avoiding knocking over cones to reduce circumduction, 8x -Ther Act: pt standing against wall with L side of body, v cues for upright posture, marches  with LLE, support with QC, 2x10 -STS: 5x t/o session, initially required mod A-->CGA-->mod(I)   04/04/24:  QC Gait training 72ft with CGA Weight shifting with UE on mat with Lt hand on small ball forward/backward with assistance for hand and wrist extension Sidelying: clam Supine: Bridge with ball between knees 10x 5" Standing: Passive stretching of LUE and hand, pt standing at mat table, PT support at elbow, and providing input to L hand, pt reports pain with pressure Standing Hamstring curl, 10x each leg Standing hip abduction, 10x, v cues for control   PATIENT EDUCATION:  Education details: PT evaluation, objective findings, POC, Importance of HEP, Precautions, Clinic policies  Person educated: Patient Education method: Explanation and Demonstration Education comprehension: verbalized understanding and returned demonstration  HOME EXERCISE PROGRAM: Access Code: [email or web address] URL: [email or web address] Date: 03/02/2024 Prepared by: Virgia Griffins Powell-Butler  Exercises - Seated Shoulder Flexion AAROM with Hemiparetic UE  - 2 x daily - 7 x weekly - 2 sets - 10 reps - 5 hold - Side to Side Weight Shift with Counter Support  - 2 x daily - 7 x weekly - 2 sets - 10 reps - Sit to Stand with Counter Support  - 2 x daily - 7 x weekly - 2 sets - 10 reps  ASSESSMENT:  CLINICAL IMPRESSION: Began session with , slow cadence with LBQC with no LOB step to pattern.  Pt presents with poor activity tolerance, needing rest breaks between all exercises.  Pt reports compliance with HEP daily though therapist questions this as minimal progress since beginning therapy.  Continued session focus with LE strengthening and balance training with Mod A for safety without HHA.   Patient is a 63 y.o. male who was seen today for physical therapy evaluation and treatment for G81.10 (ICD-10-CM) - Spastic hemiplegia affecting nondominant side (HCC). Patient with relevant history of CVA in 2022, affecting  L side of body causing L sided hemiparesis. On this date, patient demonstrates decreased ROM of LUE, generalized weakness L>R, increased tone of L side, and impaired balance/coordination which may contribute to altered gait, decreased functional independence with ADLs/iADLs, and impaired endurance. Patient will benefit from skilled physical therapy in order to address the above to improve QOL and maximize  independence.   OBJECTIVE IMPAIRMENTS: Abnormal gait, decreased activity tolerance, decreased balance, decreased coordination, decreased endurance, decreased mobility, difficulty walking, decreased ROM, decreased strength, hypomobility, impaired sensation, impaired tone, impaired UE functional use, and improper body mechanics.   ACTIVITY LIMITATIONS: carrying, lifting, bending, squatting, stairs, transfers, bed mobility, bathing, toileting, dressing, and hygiene/grooming  PARTICIPATION LIMITATIONS: cleaning, laundry, driving, shopping, community activity, occupation, and yard work  PERSONAL FACTORS: Time since onset of injury/illness/exacerbation are also affecting patient's functional outcome.   REHAB POTENTIAL: Good  CLINICAL DECISION MAKING: Stable/uncomplicated  EVALUATION COMPLEXITY: Moderate   GOALS: Goals reviewed with patient? Yes  SHORT TERM GOALS: Target date: 04/27/24 Patient will be independent with performance of HEP to demonstrate adequate self management of symptoms.  Baseline:  Goal status: IN PROGRESS  2.   Patient will report at least a 25% improvement with function or pain overall since beginning PT. Baseline:  Goal status: IN PROGRESS   LONG TERM GOALS: Target date: 05/25/24  Patient will report at least a 50% improvement with function or pain  overall since beginning PT. Baseline:  Goal status: IN PROGRESS  2.  Patient will score at least a 4-/5 on LLE MMT to demonstrate improved LE strength for functional activities.  Baseline:  Goal status: IN  PROGRESS  3.  Patient will improve TUG score by at least 30 seconds to decrease fall risk.  Baseline:  Goal status: IN PROGRESS  4.  Patient will improve 5xSTS score by at least 10 seconds to demonstrate improved LE power for functional activities. Baseline:  Goal status: IN PROGRESS  PLAN:  PT FREQUENCY: 2x/week  PT DURATION: 6 weeks  PLANNED INTERVENTIONS: 97164- PT Re-evaluation, 97110-Therapeutic exercises, 97530- Therapeutic activity, 97112- Neuromuscular re-education, 97535- Self Care, 16109- Manual therapy, [phone]- Gait training, Patient/Family education, Balance training, and Joint mobilization  PLAN FOR NEXT SESSION: Progress WB activity of LUE/LLE, strengthening LLE/LUE, balance, quadruped  Manual stretching to LUE/LLE  Minor Amble, LPTA/CLT; Johnye Napoleon [phone]  2:04 PM, 04/25/24

## 2024-04-27 ENCOUNTER — Encounter (HOSPITAL_COMMUNITY)

## 2024-05-09 ENCOUNTER — Encounter (HOSPITAL_COMMUNITY): Payer: Self-pay

## 2024-05-09 ENCOUNTER — Ambulatory Visit (HOSPITAL_COMMUNITY): Attending: Internal Medicine

## 2024-05-09 DIAGNOSIS — R262 Difficulty in walking, not elsewhere classified: Secondary | ICD-10-CM | POA: Insufficient documentation

## 2024-05-09 DIAGNOSIS — G8114 Spastic hemiplegia affecting left nondominant side: Secondary | ICD-10-CM | POA: Insufficient documentation

## 2024-05-09 DIAGNOSIS — R2689 Other abnormalities of gait and mobility: Secondary | ICD-10-CM | POA: Insufficient documentation

## 2024-05-09 DIAGNOSIS — Z7409 Other reduced mobility: Secondary | ICD-10-CM | POA: Insufficient documentation

## 2024-05-09 DIAGNOSIS — M6281 Muscle weakness (generalized): Secondary | ICD-10-CM | POA: Insufficient documentation

## 2024-05-09 NOTE — Therapy (Signed)
 OUTPATIENT PHYSICAL THERAPY LOWER EXTREMITY TREATMENT   Patient Name: Matthew Wise MRN: [id number] DOB:[birth year], 63 y.o., male Today's Date: 05/09/2024    END OF SESSION:  PT End of Session - 05/09/24 1342     Visit Number 10    Number of Visits 12    Date for PT Re-Evaluation 05/11/24    Authorization Type [employer] Medicare Dual Complete    Authorization Time Period No auth needed    Progress Note Due on Visit 17   PN complete visit #7   PT Start Time 1304    PT Stop Time 1342    PT Time Calculation (min) 38 min    Equipment Utilized During Treatment Gait belt    Activity Tolerance Patient tolerated treatment well;Patient limited by fatigue    Behavior During Therapy WFL for tasks assessed/performed;Flat affect               Past Medical History:  Diagnosis Date   Acute CVA (cerebrovascular accident) (HCC) 09/09/2020   Chronic anticoagulation 11/14/2014   CKD (chronic kidney disease) stage 3, GFR 30-59 ml/min (HCC) 10/24/2019   CKD (chronic kidney disease) stage 3, GFR 30-59 ml/min (HCC) 10/24/2019   Clotting disorder (HCC)    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 two simple tubular adenomas removed. patient was advised  to go to [facility] for colonoscopy but he did not keep appt.   IR CT HEAD LTD  02/02/2021   IR PERCUTANEOUS ART THROMBECTOMY/INFUSION INTRACRANIAL INC DIAG ANGIO  02/02/2021   RADIOLOGY WITH ANESTHESIA N/A 02/02/2021   Procedure: IR WITH ANESTHESIA;  Surgeon: Luellen Sages, MD;  Location: [facility] OR;  Service: Radiology;  Laterality: N/A;   TEE WITHOUT CARDIOVERSION N/A 09/10/2020   Procedure: TRANSESOPHAGEAL ECHOCARDIOGRAM (TEE) WITH PROPOFOL ;  Surgeon: Gerard Knight, MD;  Location: [facility];  Service: Cardiovascular;  Laterality: N/A;   XI ROBOTIC LAPAROSCOPIC ASSISTED APPENDECTOMY N/A 10/09/2023   Procedure: XI ROBOTIC LAPAROSCOPIC ASSISTED APPENDECTOMY;  Surgeon: Marijo Shove, DO;  Location: [facility];  Service: General;  Laterality: N/A;   Patient Active Problem List   Diagnosis Date Noted   Postoperative ileus (HCC) 10/13/2023   Acute pancreatitis 10/08/2023   Skin abscess 10/01/2023   Acute non-recurrent sinusitis 01/15/2023   Gastroesophageal reflux disease 01/15/2023   Seizure with onset of stroke (HCC) 01/15/2023   Encounter for general adult medical examination with abnormal findings 07/06/2022   Stage 3a chronic kidney disease (HCC) 07/06/2022   Hospital discharge follow-up 04/30/2022   Obesity, Class I, BMI 30-34.9 04/25/2022   Generalized weakness    Near syncope    Non-seasonal allergic rhinitis 03/25/2022   Encounter for examination following treatment at hospital 02/27/2022   BPH (benign prostatic hyperplasia) 09/24/2021   History of CVA  with residual deficit 09/24/2021   Dysphagia, post-stroke    Spastic hemiplegia affecting nondominant side (HCC)    Hemiparesis affecting left side as late effect of stroke (HCC)    Thyroid  nodule 01/25/2021   Hypothyroidism 01/25/2021   H/O adenomatous polyp of colon 12/30/2020   Essential hypertension 11/28/2020   Mixed hyperlipidemia 11/28/2020   H/O medication noncompliance 09/07/2020   Nicotine  dependence 12/05/2019    Prediabetes 10/24/2019   Tubular adenoma of colon 09/14/2017   Long term (current) use of anticoagulants 11/14/2014   Sickle cell trait (HCC) 08/29/2012   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 Pt reporting he attempts doing his HEP as much as he can at home.    Patient reports history of CVA in 2022. Reports he received rehab in hospital, then home health, then outpatient PT here. After discharge from here, reports he got more home health PT. Reports he's still walking around home. Reports he's back  because he feels he needs more. Has an Aid that lives with him. Reports mod assist from her for ADLs. Max A for iADLs. LBQC use and w/c at home. Doesn't use w/c. Sometimes takes it to Dr visits.   PERTINENT HISTORY: CVA in 2022 Recurrent Wounds Clotting disorder DVT/PE PAIN:  Are you having pain? No  PRECAUTIONS: None  RED FLAGS: Bowel or bladder incontinence: No   WEIGHT BEARING RESTRICTIONS: No  FALLS:  Has patient fallen in last 6 months? No  LIVING ENVIRONMENT: Lives with: lives with their family Health care aid and daughter sometimes visits Lives in: House/apartment Stairs: No Has following equipment at home: Chiropractor, Wheelchair (manual), and Ramped entry  OCCUPATION: N/A. Disability  PLOF: Independent prior to CVA  PATIENT GOALS: Like to get strength back in Leg, and use of L arm  and do  more for himself   NEXT MD VISIT: Matthew Wise, may be in June  OBJECTIVE:  Note: Objective measures were completed at Evaluation unless otherwise noted.  PATIENT SURVEYS:    COGNITION: Overall cognitive status: Within functional limits for tasks assessed     SENSATION: Light touch: Impaired   Dec sensation t/o LLE, all dermatome levels.     Mild impairment in LUE  POSTURE: rounded shoulders, decreased lumbar lordosis, increased thoracic kyphosis, and flexed trunk   PALPATION: NT  LOWER EXTREMITY ROM:  Active ROM Right eval Left eval  Hip flexion    Hip extension    Hip abduction    Hip adduction    Hip internal rotation    Hip external rotation    Knee flexion    Knee extension    Ankle dorsiflexion    Ankle plantarflexion    Ankle inversion    Ankle eversion     (Blank rows = not tested)  LEFT UPPER EXTREMITY: Modified [place] level 2 2 (2) - More marked increase in tone but limb easily flexed.  RUE: shoulder flexion ROM WFL, 4+/5 MMT Minimal activation of L hand musculature, pt unable to open/close  voluntarily.  LOWER EXTREMITY MMT:  MMT Right eval Left eval R 04/13/24 L 04/13/24  Hip flexion 4- 3 4- 3  Hip extension      Hip abduction      Hip adduction      Hip internal rotation      Hip external rotation      Knee flexion 4- 3 4- 3  Knee extension 4- 3 4- 3  Ankle dorsiflexion      Ankle plantarflexion      Ankle inversion      Ankle eversion       (Blank rows = not tested)   FUNCTIONAL TESTS:  5 times sit to stand: 37 Sec Timed up and go (TUG): 1 min 13 sec  All w/ use of armrests, and LBQA for stability once standing, CGA for safety    04/13/24:   5xTSS: 31 sec, use of RUE to push up TUG: 1 min 22 sec, w/ LBQC, inc step length with RLE  04/25/24: 47ft with QC  GAIT: Distance walked: 75 Assistive device utilized: Quad cane large base Level of assistance: Modified independence and SBA Comments: Pt ambulates with LBQC w/ extremely  dec velocity, dec arm swing with LUE (shoulder abd, IR, elbow flexed, pronated, wrist flexed fingers flexed), forward lean, rounded shoulders, dec trunk rotation, step to pattern, dec knee flexion in LLE, dec heel strike in L foot, dec step lengths bilat, dec hip ext bilat.                                                                                                                                TREATMENT DATE:  05/09/2024  -BP: 112/78  HR: 74  -Gait training to mat table; cues for increased left weight shift via verbal cues CGA<> min assist to balance x 4' NM: - 10x sit to stand with mod assist with symmetrical WB and no AD into standing weight shifts in front of mirror for visual cues with moderate to maximum facilitation into symmetrical weight bearing sustained holds moderate assist to maintain balance. Tactile cues and verbal cues for constant weight shift and use of mirror for postural adjustments.   -Gait training to mat table; cues for increased left weight shift via verbal cues CGA<> min assist to balance x 4'  04/25/24: 35ft with LBQC step to pattern 5 STS x2 sets with Rt UE push off Small obstacle step over 3 bars 2RT inside //bars forward Sidestep with theraband around thighs 2RT Squat front of chair 2x 5 Partial tandem stance 2x 20"   04/18/24: Gait training 2 sets with [facility] [place] x5" with therapist assistance with Lt UE Sidestep front of mat 2RT 5STS Squats front of mat 2x 5 Standing in // bars Cones placed on L side, pt stepping over flat obstacle on ground, goal to step over obstacle and back while avoiding knocking over cones to reduce circumduction, 8x  Manual:  Passive stretching to LUE into shoulder flexion, abduction, and elbow extension  Standing hip abduction, 10x, v cues for control   PATIENT EDUCATION:  Education details: PT evaluation, objective findings, POC, Importance of HEP, Precautions, Clinic policies  Person educated:  Patient Education method: Explanation and Demonstration Education comprehension: verbalized understanding and returned demonstration  HOME EXERCISE PROGRAM: Access Code: [email or web address] URL: [email or web address] Date: 03/02/2024 Prepared by: Virgia Griffins Powell-Butler  Exercises - Seated Shoulder Flexion AAROM with Hemiparetic UE  - 2 x daily - 7 x weekly - 2 sets - 10 reps - 5 hold - Side to Side Weight Shift with Counter Support  - 2 x daily - 7 x weekly - 2 sets - 10 reps - Sit to Stand with Counter Support  - 2 x daily - 7 x weekly - 2 sets - 10 reps  ASSESSMENT:  CLINICAL IMPRESSION: Patient tolerated treatment session well today.  We focus was to address significant right postural weight shift due to sequela of patient's stroke.  Primary time spent in front of mirror with moderate to maximum facilitation's with patient's center of mass.  Patient progressed well at the end of weight shift patient was able to maintain symmetrical standing with moderate assist for balance.  Patient was able to tolerate new challenges and symmetry but increased fear initially. Pt will benefit from skilled Physical Therapy services to address deficits/limitations in order to improve functional and QOL.    Patient is a 63 y.o. male who was seen today for physical therapy evaluation and treatment for G81.10 (ICD-10-CM) - Spastic hemiplegia affecting nondominant side (HCC). Patient with relevant history of CVA in 2022, affecting L side of body causing L sided hemiparesis. On this date, patient demonstrates decreased ROM of LUE, generalized weakness L>R, increased tone of L side, and impaired balance/coordination which may contribute to altered gait, decreased functional independence with ADLs/iADLs, and impaired endurance. Patient will benefit from skilled physical therapy in order to address the above to improve QOL and maximize  independence.   OBJECTIVE IMPAIRMENTS: Abnormal gait, decreased activity  tolerance, decreased balance, decreased coordination, decreased endurance, decreased mobility, difficulty walking, decreased ROM, decreased strength, hypomobility, impaired sensation, impaired tone, impaired UE functional use, and improper body mechanics.   ACTIVITY LIMITATIONS: carrying, lifting, bending, squatting, stairs, transfers, bed mobility, bathing, toileting, dressing, and hygiene/grooming  PARTICIPATION LIMITATIONS: cleaning, laundry, driving, shopping, community activity, occupation, and yard work  PERSONAL FACTORS: Time since onset of injury/illness/exacerbation are also affecting patient's functional outcome.   REHAB POTENTIAL: Good  CLINICAL DECISION MAKING: Stable/uncomplicated  EVALUATION COMPLEXITY: Moderate   GOALS: Goals reviewed with patient? Yes  SHORT TERM GOALS: Target date: 04/27/24 Patient will be independent with performance of HEP to demonstrate adequate self management of symptoms.  Baseline:  Goal status: IN PROGRESS  2.   Patient will report at least a 25% improvement with function or pain overall since beginning PT. Baseline:  Goal status: IN PROGRESS   LONG TERM GOALS: Target date: 05/25/24  Patient will report at least a 50% improvement with function or pain overall since beginning PT. Baseline:  Goal status: IN PROGRESS  2.  Patient will score at least a 4-/5 on LLE MMT to demonstrate improved LE strength for functional activities.  Baseline:  Goal status: IN PROGRESS  3.  Patient will improve TUG score by at least 30 seconds to decrease fall risk.  Baseline:  Goal status: IN PROGRESS  4.  Patient will improve 5xSTS score by at least 10 seconds to demonstrate  improved LE power for functional activities. Baseline:  Goal status: IN PROGRESS  PLAN:  PT FREQUENCY: 2x/week  PT DURATION: 6 weeks  PLANNED INTERVENTIONS: 97164- PT Re-evaluation, 97110-Therapeutic exercises, 97530- Therapeutic activity, 97112- Neuromuscular re-education,  97535- Self Care, 16109- Manual therapy, [phone]- Gait training, Patient/Family education, Balance training, and Joint mobilization  PLAN FOR NEXT SESSION: Progress WB activity of LUE/LLE, strengthening LLE/LUE, balance, quadruped  Manual stretching to LUE/LLE  Gatha Kaska PT, DPT [facility] [place] [phone] office  1:45 PM, 05/09/24

## 2024-05-12 ENCOUNTER — Other Ambulatory Visit: Payer: Self-pay | Admitting: Internal Medicine

## 2024-05-12 DIAGNOSIS — R351 Nocturia: Secondary | ICD-10-CM

## 2024-05-16 ENCOUNTER — Encounter (HOSPITAL_COMMUNITY): Payer: Self-pay

## 2024-05-16 ENCOUNTER — Ambulatory Visit (HOSPITAL_COMMUNITY)

## 2024-05-16 DIAGNOSIS — G8114 Spastic hemiplegia affecting left nondominant side: Secondary | ICD-10-CM

## 2024-05-16 DIAGNOSIS — M6281 Muscle weakness (generalized): Secondary | ICD-10-CM

## 2024-05-16 DIAGNOSIS — Z7409 Other reduced mobility: Secondary | ICD-10-CM

## 2024-05-16 DIAGNOSIS — R262 Difficulty in walking, not elsewhere classified: Secondary | ICD-10-CM

## 2024-05-16 DIAGNOSIS — R2689 Other abnormalities of gait and mobility: Secondary | ICD-10-CM | POA: Diagnosis not present

## 2024-05-16 NOTE — Therapy (Signed)
 OUTPATIENT PHYSICAL THERAPY LOWER EXTREMITY TREATMENT   Patient Name: Matthew Wise MRN: [id number] DOB:02/16/1961, 63 y.o., male Today's Date: 05/16/2024    END OF SESSION:  PT End of Session - 05/16/24 1442     Visit Number 11    Number of Visits 12    Date for PT Re-Evaluation 05/18/24    Authorization Type [employer] Medicare Dual Complete    Authorization Time Period No auth needed    Progress Note Due on Visit 12    PT Start Time 1442    PT Stop Time 1515    PT Time Calculation (min) 33 min    Equipment Utilized During Treatment Gait belt    Activity Tolerance Patient tolerated treatment well;Patient limited by fatigue    Behavior During Therapy WFL for tasks assessed/performed;Flat affect            Past Medical History:  Diagnosis Date   Acute CVA (cerebrovascular accident) (HCC) 09/09/2020   Chronic anticoagulation 11/14/2014   CKD (chronic kidney disease) stage 3, GFR 30-59 ml/min (HCC) 10/24/2019   CKD (chronic kidney disease) stage 3, GFR 30-59 ml/min (HCC) 10/24/2019   Clotting disorder (HCC)    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 two simple tubular adenomas removed. patient was advised to go to [facility] for  colonoscopy but he did not keep appt.   IR CT HEAD LTD  02/02/2021   IR PERCUTANEOUS ART THROMBECTOMY/INFUSION INTRACRANIAL INC DIAG ANGIO  02/02/2021   RADIOLOGY WITH ANESTHESIA N/A 02/02/2021   Procedure: IR WITH ANESTHESIA;  Surgeon: Luellen Sages, MD;  Location: [facility] OR;  Service: Radiology;  Laterality: N/A;   TEE WITHOUT CARDIOVERSION N/A 09/10/2020   Procedure: TRANSESOPHAGEAL ECHOCARDIOGRAM (TEE) WITH PROPOFOL ;  Surgeon: Gerard Knight, MD;  Location: [facility];  Service: Cardiovascular;  Laterality: N/A;   XI ROBOTIC LAPAROSCOPIC ASSISTED APPENDECTOMY N/A 10/09/2023   Procedure: XI ROBOTIC LAPAROSCOPIC ASSISTED APPENDECTOMY;  Surgeon: Marijo Shove, DO;  Location: [facility];  Service: General;  Laterality: N/A;   Patient Active Problem List   Diagnosis Date Noted   Postoperative ileus (HCC) 10/13/2023   Acute pancreatitis 10/08/2023   Skin abscess 10/01/2023   Acute non-recurrent sinusitis 01/15/2023   Gastroesophageal reflux disease 01/15/2023   Seizure with onset of stroke (HCC) 01/15/2023   Encounter for general adult medical examination with abnormal findings 07/06/2022   Stage 3a chronic kidney disease (HCC) 07/06/2022   Hospital discharge follow-up 04/30/2022   Obesity, Class I, BMI 30-34.9 04/25/2022   Generalized weakness    Near syncope    Non-seasonal allergic rhinitis 03/25/2022   Encounter for examination following treatment at hospital 02/27/2022   BPH (benign prostatic hyperplasia) 09/24/2021   History of CVA with residual deficit 09/24/2021   Dysphagia, post-stroke  Spastic hemiplegia affecting nondominant side (HCC)    Hemiparesis affecting left side as late effect of stroke (HCC)    Thyroid  nodule 01/25/2021   Hypothyroidism 01/25/2021   H/O adenomatous polyp of colon 12/30/2020   Essential hypertension 11/28/2020   Mixed hyperlipidemia 11/28/2020   H/O medication noncompliance 09/07/2020   Nicotine  dependence 12/05/2019   Prediabetes 10/24/2019    Tubular adenoma of colon 09/14/2017   Long term (current) use of anticoagulants 11/14/2014   Sickle cell trait (HCC) 08/29/2012   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    PCP: Meldon Sport, MD  REFERRING PROVIDER: Meldon Sport, MD  REFERRING DIAG: G81.10 (ICD-10-CM) - Spastic hemiplegia affecting nondominant side (HCC)  THERAPY DIAG:  Muscle weakness (generalized)  Spastic hemiplegia affecting left nondominant side, unspecified etiology (HCC)  Impaired functional mobility, balance, gait, and endurance  Other abnormalities of gait and mobility  Difficulty in walking, not elsewhere classified  Rationale for Evaluation and Treatment: Rehabilitation  ONSET DATE: 2022 Rehab, at home health, 2023, home health    SUBJECTIVE:   SUBJECTIVE STATEMENT: Pt late to session. Reporting HEP being done every day.   Patient reports history of CVA in 2022. Reports he received rehab in hospital, then home health, then outpatient PT here. After discharge from here, reports he got more home health PT. Reports he's still walking around home. Reports he's back  because he feels he needs more. Has an Aid that lives with him. Reports mod assist from her for ADLs. Max A for iADLs. LBQC use and w/c at home. Doesn't use w/c. Sometimes takes it to Dr visits.   PERTINENT HISTORY: CVA in 2022 Recurrent Wounds Clotting disorder DVT/PE PAIN:  Are you having pain? No  PRECAUTIONS: None  RED FLAGS: Bowel or bladder incontinence: No   WEIGHT BEARING RESTRICTIONS: No  FALLS:  Has patient fallen in last 6 months? No  LIVING ENVIRONMENT: Lives with: lives with their family Health care aid and daughter sometimes visits Lives in: House/apartment Stairs: No Has following equipment at home: Chiropractor, Wheelchair (manual), and Ramped entry  OCCUPATION: N/A. Disability  PLOF: Independent prior to CVA  PATIENT GOALS: Like to get strength back in Leg, and use of L  arm  and do more for himself   NEXT MD VISIT: [name], may be in June  OBJECTIVE:  Note: Objective measures were completed at Evaluation unless otherwise noted.  PATIENT SURVEYS:    COGNITION: Overall cognitive status: Within functional limits for tasks assessed     SENSATION: Light touch: Impaired   Dec sensation t/o LLE, all dermatome levels.     Mild impairment in LUE  POSTURE: rounded shoulders, decreased lumbar lordosis, increased thoracic kyphosis, and flexed trunk   PALPATION: NT  LOWER EXTREMITY ROM:  Active ROM Right eval Left eval  Hip flexion    Hip extension    Hip abduction    Hip adduction    Hip internal rotation    Hip external rotation    Knee flexion    Knee extension    Ankle dorsiflexion    Ankle plantarflexion    Ankle inversion    Ankle eversion     (Blank rows = not tested)  LEFT UPPER EXTREMITY: Modified Ashworth level 2 2 (2) - More marked increase in tone but limb easily flexed.  RUE: shoulder flexion ROM WFL, 4+/5 MMT Minimal activation of L hand musculature, pt unable to open/close voluntarily.  LOWER EXTREMITY MMT:  MMT Right eval  Left eval R 04/13/24 L 04/13/24  Hip flexion 4- 3 4- 3  Hip extension      Hip abduction      Hip adduction      Hip internal rotation      Hip external rotation      Knee flexion 4- 3 4- 3  Knee extension 4- 3 4- 3  Ankle dorsiflexion      Ankle plantarflexion      Ankle inversion      Ankle eversion       (Blank rows = not tested)   FUNCTIONAL TESTS:  5 times sit to stand: 37 Sec Timed up and go (TUG): 1 min 13 sec  All w/ use of armrests, and LBQA for stability once standing, CGA for safety    04/13/24:   5xTSS: 31 sec, use of RUE to push up TUG: 1 min 22 sec, w/ LBQC, inc step length with RLE  04/25/24: 37ft with QC  GAIT: Distance walked: 75 Assistive device utilized: Quad cane large base Level of assistance: Modified independence and SBA Comments: Pt ambulates with LBQC w/  extremely dec velocity, dec arm swing with LUE (shoulder abd, IR, elbow flexed, pronated, wrist flexed fingers flexed), forward lean, rounded shoulders, dec trunk rotation, step to pattern, dec knee flexion in LLE, dec heel strike in L foot, dec step lengths bilat, dec hip ext bilat.                                                                                                                                TREATMENT DATE:  05/16/24: Ambulation training: 55 ft x2, verbal and tactile cues at hip for weight shift into LLE and R step length Obstacle navigation in // bars, RUE support, flat obstacle, 15x  4 inch half roll, 15x Weight shifting, RLE<>LLE, in // bars, verbal and tactile cueing, 15x2 STS, verbal and tactile cueing into LLE at knee  05/09/2024  -BP: 112/78  HR: 74  -Gait training to mat table; cues for increased left weight shift via verbal cues CGA<> min assist to balance x 4' NM: - 10x sit to stand with mod assist with symmetrical WB and no AD into standing weight shifts in front of mirror for visual cues with moderate to maximum facilitation into symmetrical weight bearing sustained holds moderate assist to maintain balance. Tactile cues and verbal cues for constant weight shift and use of mirror for postural adjustments.   -Gait training to mat table; cues for increased left weight shift via verbal cues CGA<> min assist to balance x 4'  04/25/24: 41ft with LBQC step to pattern 5 STS x2 sets with Rt UE push off Small obstacle step over 3 bars 2RT inside //bars forward Sidestep with theraband around thighs 2RT Squat front of chair 2x 5 Partial tandem stance 2x 20   PATIENT EDUCATION:  Education details: PT evaluation, objective findings, POC, Importance of HEP, Precautions,  Clinic policies  Person educated: Patient Education method: Medical illustrator Education comprehension: verbalized understanding and returned demonstration  HOME EXERCISE  PROGRAM: Access Code: [email or web address] URL: [email or web address] Date: 03/02/2024 Prepared by: Virgia Griffins Powell-Butler  Exercises - Seated Shoulder Flexion AAROM with Hemiparetic UE  - 2 x daily - 7 x weekly - 2 sets - 10 reps - 5 hold - Side to Side Weight Shift with Counter Support  - 2 x daily - 7 x weekly - 2 sets - 10 reps - Sit to Stand with Counter Support  - 2 x daily - 7 x weekly - 2 sets - 10 reps  ASSESSMENT:  CLINICAL IMPRESSION: Patient tolerated session well. Began with gait training. Pt continues to display dec weight shift to LLE and limited step length with RLE. Verbal cues given with mild correction, pt reporting fear. Followed with activities focused on forcing inc weight shift into LLE. PT giving tactile cueing at R hip to shift inc weight into L as well as blocking L foot from ER throughout. During STS, PT provides same stability at foot but also provides inc weight into LLE by providing downward pressure into knee throughout. Patient demo some good carryover with ambulation at end of session but demo difficulty when fatigued. Pt will benefit from skilled Physical Therapy services to address deficits/limitations in order to improve functional and QOL.    Patient is a 63 y.o. male who was seen today for physical therapy evaluation and treatment for G81.10 (ICD-10-CM) - Spastic hemiplegia affecting nondominant side (HCC). Patient with relevant history of CVA in 2022, affecting L side of body causing L sided hemiparesis. On this date, patient demonstrates decreased ROM of LUE, generalized weakness L>R, increased tone of L side, and impaired balance/coordination which may contribute to altered gait, decreased functional independence with ADLs/iADLs, and impaired endurance. Patient will benefit from skilled physical therapy in order to address the above to improve QOL and maximize  independence.   OBJECTIVE IMPAIRMENTS: Abnormal gait, decreased activity tolerance, decreased  balance, decreased coordination, decreased endurance, decreased mobility, difficulty walking, decreased ROM, decreased strength, hypomobility, impaired sensation, impaired tone, impaired UE functional use, and improper body mechanics.   ACTIVITY LIMITATIONS: carrying, lifting, bending, squatting, stairs, transfers, bed mobility, bathing, toileting, dressing, and hygiene/grooming  PARTICIPATION LIMITATIONS: cleaning, laundry, driving, shopping, community activity, occupation, and yard work  PERSONAL FACTORS: Time since onset of injury/illness/exacerbation are also affecting patient's functional outcome.   REHAB POTENTIAL: Good  CLINICAL DECISION MAKING: Stable/uncomplicated  EVALUATION COMPLEXITY: Moderate   GOALS: Goals reviewed with patient? Yes  SHORT TERM GOALS: Target date: 04/27/24 Patient will be independent with performance of HEP to demonstrate adequate self management of symptoms.  Baseline:  Goal status: IN PROGRESS  2.   Patient will report at least a 25% improvement with function or pain overall since beginning PT. Baseline:  Goal status: IN PROGRESS   LONG TERM GOALS: Target date: 05/25/24  Patient will report at least a 50% improvement with function or pain overall since beginning PT. Baseline:  Goal status: IN PROGRESS  2.  Patient will score at least a 4-/5 on LLE MMT to demonstrate improved LE strength for functional activities.  Baseline:  Goal status: IN PROGRESS  3.  Patient will improve TUG score by at least 30 seconds to decrease fall risk.  Baseline:  Goal status: IN PROGRESS  4.  Patient will improve 5xSTS score by at least 10 seconds to demonstrate improved LE power for functional activities. Baseline:  Goal status:  IN PROGRESS  PLAN:  PT FREQUENCY: 2x/week  PT DURATION: 6 weeks  PLANNED INTERVENTIONS: [id number]- PT Re-evaluation, 97110-Therapeutic exercises, 97530- Therapeutic activity, 97112- Neuromuscular re-education, 97535- Self Care, [id number]-  Manual therapy, [phone]- Gait training, Patient/Family education, Balance training, and Joint mobilization  PLAN FOR NEXT SESSION: Progress WB activity of LUE/LLE, strengthening LLE/LUE, balance, quadruped  Manual stretching to LUE/LLE, progress note next session: dx or cont?  4:24 PM, 05/16/24 Marysue Sola, PT, DPT [facility] [place]

## 2024-05-18 ENCOUNTER — Encounter (HOSPITAL_COMMUNITY): Payer: Self-pay

## 2024-05-18 ENCOUNTER — Ambulatory Visit (HOSPITAL_COMMUNITY)

## 2024-05-18 DIAGNOSIS — R2689 Other abnormalities of gait and mobility: Secondary | ICD-10-CM | POA: Diagnosis not present

## 2024-05-18 DIAGNOSIS — R262 Difficulty in walking, not elsewhere classified: Secondary | ICD-10-CM | POA: Diagnosis not present

## 2024-05-18 DIAGNOSIS — G8114 Spastic hemiplegia affecting left nondominant side: Secondary | ICD-10-CM

## 2024-05-18 DIAGNOSIS — M6281 Muscle weakness (generalized): Secondary | ICD-10-CM

## 2024-05-18 DIAGNOSIS — Z7409 Other reduced mobility: Secondary | ICD-10-CM | POA: Diagnosis not present

## 2024-05-18 NOTE — Therapy (Signed)
 OUTPATIENT PHYSICAL THERAPY LOWER EXTREMITY TREATMENT/PROGRESS NOTE/DISCHARGE   Patient Name: Matthew Wise MRN: [id number] DOB:[birth year], 63 y.o., male Today's Date: 05/18/2024  PHYSICAL THERAPY DISCHARGE SUMMARY  Visits from Start of Care: 12  Current functional level related to goals / functional outcomes: See below   Remaining deficits: See below   Education / Equipment: See below   Patient agrees to discharge. Patient goals were not met. Patient is being discharged due to maximized rehab potential.     END OF SESSION:  PT End of Session - 05/18/24 1519     Visit Number 12    Number of Visits 12    Authorization Type [employer] Medicare Dual Complete    Authorization Time Period No auth needed    PT Start Time 1520    PT Stop Time 1559    PT Time Calculation (min) 39 min    Activity Tolerance Patient tolerated treatment well;Patient limited by fatigue    Behavior During Therapy WFL for tasks assessed/performed;Flat affect            Past Medical History:  Diagnosis Date   Acute CVA (cerebrovascular accident) (HCC) 09/09/2020   Chronic anticoagulation 11/14/2014   CKD (chronic kidney disease) stage 3, GFR 30-59 ml/min (HCC) 10/24/2019   CKD (chronic kidney disease) stage 3, GFR 30-59 ml/min (HCC) 10/24/2019   Clotting disorder (HCC)    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 two simple tubular adenomas removed. patient was advised to go to [facility] for colonoscopy but he did not keep appt.   IR CT HEAD LTD  02/02/2021   IR PERCUTANEOUS ART THROMBECTOMY/INFUSION INTRACRANIAL INC DIAG ANGIO  02/02/2021   RADIOLOGY WITH ANESTHESIA N/A 02/02/2021   Procedure: IR WITH ANESTHESIA;  Surgeon: Luellen Sages, MD;  Location: [facility] OR;  Service: Radiology;  Laterality: N/A;   TEE WITHOUT CARDIOVERSION N/A 09/10/2020   Procedure: TRANSESOPHAGEAL ECHOCARDIOGRAM (TEE) WITH PROPOFOL ;  Surgeon: Gerard Knight, MD;  Location: [facility]S;  Service: Cardiovascular;  Laterality: N/A;   XI ROBOTIC LAPAROSCOPIC ASSISTED APPENDECTOMY N/A 10/09/2023   Procedure: XI ROBOTIC LAPAROSCOPIC ASSISTED APPENDECTOMY;  Surgeon: Marijo Shove, DO;  Location: [facility];  Service: General;  Laterality: N/A;   Patient Active Problem List   Diagnosis Date Noted   Postoperative ileus (HCC) 10/13/2023   Acute pancreatitis 10/08/2023   Skin abscess 10/01/2023   Acute non-recurrent sinusitis 01/15/2023   Gastroesophageal reflux disease 01/15/2023   Seizure with onset of stroke (HCC) 01/15/2023   Encounter for general adult medical examination with abnormal findings 07/06/2022   Stage 3a chronic kidney disease (HCC) 07/06/2022   Hospital discharge follow-up 04/30/2022   Obesity, Class I, BMI 30-34.9 04/25/2022   Generalized weakness    Near syncope    Non-seasonal  allergic rhinitis 03/25/2022   Encounter for examination following treatment at hospital 02/27/2022   BPH (benign prostatic hyperplasia) 09/24/2021   History of CVA with residual deficit 09/24/2021   Dysphagia, post-stroke    Spastic hemiplegia affecting nondominant side (HCC)    Hemiparesis affecting left side as late effect of stroke (HCC)    Thyroid  nodule 01/25/2021    Hypothyroidism 01/25/2021   H/O adenomatous polyp of colon 12/30/2020   Essential hypertension 11/28/2020   Mixed hyperlipidemia 11/28/2020   H/O medication noncompliance 09/07/2020   Nicotine  dependence 12/05/2019   Prediabetes 10/24/2019   Tubular adenoma of colon 09/14/2017   Long term (current) use of anticoagulants 11/14/2014   Sickle cell trait (HCC) 08/29/2012   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 Reports no pain and doing okay overall.    Patient reports history of CVA in 2022. Reports he received rehab in hospital, then home health, then outpatient PT here. After discharge from here, reports he got more home health PT. Reports he's still walking around home. Reports he's back  because he feels he needs more. Has an Aid that lives with him. Reports mod assist from her for ADLs. Max A for iADLs. LBQC use and w/c at home. Doesn't use w/c. Sometimes takes it to Dr visits.   PERTINENT HISTORY: CVA in 2022 Recurrent Wounds Clotting disorder DVT/PE PAIN:  Are you having pain? No  PRECAUTIONS: None  RED FLAGS: Bowel or bladder incontinence: No   WEIGHT BEARING RESTRICTIONS: No  FALLS:  Has patient fallen in last 6 months? No  LIVING ENVIRONMENT: Lives with: lives with their family Health care aid and daughter sometimes visits Lives in: House/apartment Stairs: No Has following equipment at home: Chiropractor,  Wheelchair (manual), and Ramped entry  OCCUPATION: N/A. Disability  PLOF: Independent prior to CVA  PATIENT GOALS: Like to get strength back in Leg, and use of L arm  and do more for himself   NEXT MD VISIT: [name], may be in June  OBJECTIVE:  Note: Objective measures were completed at Evaluation unless otherwise noted.  PATIENT SURVEYS:    COGNITION: Overall cognitive status: Within functional limits for tasks assessed     SENSATION: Light touch: Impaired   Dec sensation t/o LLE, all dermatome levels.     Mild impairment in LUE  POSTURE: rounded shoulders, decreased lumbar lordosis, increased thoracic kyphosis, and flexed trunk   PALPATION: NT  LOWER EXTREMITY ROM:  Active ROM Right eval Left eval  Hip flexion    Hip extension    Hip abduction    Hip adduction    Hip internal rotation    Hip external rotation    Knee flexion    Knee extension    Ankle dorsiflexion    Ankle plantarflexion    Ankle inversion    Ankle eversion     (Blank rows = not tested)  LEFT UPPER EXTREMITY: Modified Ashworth level 2 2 (2) - More marked increase in tone but limb easily flexed.  RUE: shoulder flexion ROM WFL, 4+/5 MMT Minimal activation of L hand musculature, pt unable to open/close voluntarily.  LOWER EXTREMITY MMT:  MMT Right eval Left eval R 04/13/24 L 04/13/24 R 05/18/24 L 05/18/24  Hip flexion 4- 3 4- 3 4+ 3  Hip extension        Hip abduction        Hip adduction        Hip internal rotation        Hip external rotation        Knee flexion 4- 3 4- 3    Knee extension 4- 3 4- 3    Ankle dorsiflexion        Ankle plantarflexion        Ankle inversion        Ankle eversion         (Blank rows = not tested)   FUNCTIONAL TESTS:  5 times sit to stand: 37 Sec Timed up and go (TUG): 1 min 13 sec  All w/ use of armrests, and LBQA for stability once standing, CGA for safety    04/13/24:   5xTSS: 31 sec, use of RUE to push up TUG: 1 min 22 sec, w/ LBQC, inc  step length with RLE  04/25/24: 42ft with QC  05/18/24:  5xSTS: 29 sec, use of RUE to push up TUG: 1'9  w/ LBQC : 44', w/ LBQC  GAIT: Distance walked: 75 Assistive device utilized: Quad cane large base Level of assistance: Modified independence and SBA Comments: Pt ambulates with LBQC w/ extremely dec velocity, dec arm swing with LUE (shoulder abd, IR, elbow flexed, pronated, wrist flexed fingers flexed), forward lean, rounded shoulders, dec trunk rotation, step to pattern, dec knee flexion in LLE, dec heel strike in L foot, dec step lengths bilat, dec hip ext bilat.                                                                                                                                TREATMENT DATE:  05/18/24:  Progress Note 5xSTS LE MMT TUG Gait training: 65 ftx2, verbal and tactile cues at hip for weight shift into LLE, R step length, trunk elevation, and LBQC placement  05/16/24: Ambulation training: 55 ft x2, verbal and tactile cues at hip for weight shift into LLE and R step length Obstacle navigation in // bars, RUE support, flat obstacle, 15x  4 inch half roll, 15x Weight shifting, RLE<>LLE, in // bars, verbal and tactile cueing, 15x2 STS, verbal and tactile cueing into LLE at knee  05/09/2024  -BP: 112/78  HR: 74  -Gait training to mat table; cues for increased left weight shift via verbal cues CGA<> min assist to balance x 4' NM: - 10x sit to stand with mod assist with symmetrical WB and no AD into standing weight shifts in front of mirror for visual cues with moderate to maximum facilitation into symmetrical weight  bearing sustained holds moderate assist to maintain balance. Tactile cues and verbal cues for constant weight shift and use of mirror for postural adjustments.   -Gait training to mat table; cues for increased left weight shift via verbal cues CGA<> min assist to balance x 4'    PATIENT EDUCATION:  Education details: PT evaluation,  objective findings, POC, Importance of HEP, Precautions, Clinic policies  Person educated: Patient Education method: Explanation and Demonstration Education comprehension: verbalized understanding and returned demonstration  HOME EXERCISE PROGRAM:  Access Code: [email or web address] URL: [email or web address] Date: 05/18/2024 Prepared by: Virgia Griffins Powell-Butler  Exercises - Seated Shoulder Flexion AAROM with Hemiparetic UE  - 2 x daily - 7 x weekly - 3 sets - 10 reps - 5 hold - Side to Side Weight Shift with Counter Support  - 2 x daily - 7 x weekly - 3 sets - 10 reps - Sit to Stand with Counter Support  - 2 x daily - 7 x weekly - 3 sets - 10 reps - Seated Long Arc Quad  - 2 x daily - 7 x weekly - 3 sets - 10 reps - Supine Bridge with Resistance Band  - 2 x daily - 7 x weekly - 3 sets - 10 reps  ASSESSMENT:  CLINICAL IMPRESSION: Progress note performed on this date. Patient demonstrating mild improvements with RLE strength, and during functional tests including 5 times sit to stand, 2 minute walk test, and TUG score. Mild improvements shown but levels still indicate increased fall risk.  Patient seems to be at plateau in regards to functional level at this time and agrees to discharge to HEP. Heavy encouragement for daily compliance to HEP and increasing daily walking to maintain current level of function. Patient discharged this date.      Patient is a 63 y.o. male who was seen today for physical therapy evaluation and treatment for G81.10 (ICD-10-CM) - Spastic hemiplegia affecting nondominant side (HCC). Patient with relevant history of CVA in 2022, affecting L side of body causing L sided hemiparesis. On this date, patient demonstrates decreased ROM of LUE, generalized weakness L>R, increased tone of L side, and impaired balance/coordination which may contribute to altered gait, decreased functional independence with ADLs/iADLs, and impaired endurance. Patient will benefit from skilled  physical therapy in order to address the above to improve QOL and maximize  independence.   OBJECTIVE IMPAIRMENTS: Abnormal gait, decreased activity tolerance, decreased balance, decreased coordination, decreased endurance, decreased mobility, difficulty walking, decreased ROM, decreased strength, hypomobility, impaired sensation, impaired tone, impaired UE functional use, and improper body mechanics.   ACTIVITY LIMITATIONS: carrying, lifting, bending, squatting, stairs, transfers, bed mobility, bathing, toileting, dressing, and hygiene/grooming  PARTICIPATION LIMITATIONS: cleaning, laundry, driving, shopping, community activity, occupation, and yard work  PERSONAL FACTORS: Time since onset of injury/illness/exacerbation are also affecting patient's functional outcome.   REHAB POTENTIAL: Good  CLINICAL DECISION MAKING: Stable/uncomplicated  EVALUATION COMPLEXITY: Moderate   GOALS: Goals reviewed with patient? Yes  SHORT TERM GOALS: Target date: 04/27/24 Patient will be independent with performance of HEP to demonstrate adequate self management of symptoms.  Baseline:  Goal status: MET  2.   Patient will report at least a 25% improvement with function or pain overall since beginning PT. Baseline: 20% on 05/18/24  Goal status: PARTIALLY MET   LONG TERM GOALS: Target date: 05/25/24  Patient will report at least a 50% improvement with function or pain overall since beginning PT. Baseline:  Goal status:  NOT MET  2.  Patient will  score at least a 4-/5 on LLE MMT to demonstrate improved LE strength for functional activities.  Baseline:  Goal status: NOT MET  3.  Patient will improve TUG score by at least 30 seconds to decrease fall risk.  Baseline:  Goal status: NOT MET  4.  Patient will improve 5xSTS score by at least 10 seconds to demonstrate improved LE power for functional activities. Baseline:  Goal status: NOT MET  PLAN:  PT FREQUENCY: 2x/week  PT DURATION: 6  weeks  PLANNED INTERVENTIONS: 16109- PT Re-evaluation, 97110-Therapeutic exercises, 97530- Therapeutic activity, 97112- Neuromuscular re-education, 97535- Self Care, [id number]- Manual therapy, [phone]- Gait training, Patient/Family education, Balance training, and Joint mobilization  PLAN FOR NEXT SESSION: Discharged   4:38 PM, 05/18/24 Marysue Sola, PT, DPT [facility] [place]

## 2024-05-26 ENCOUNTER — Other Ambulatory Visit: Payer: Self-pay | Admitting: Internal Medicine

## 2024-05-26 DIAGNOSIS — E038 Other specified hypothyroidism: Secondary | ICD-10-CM

## 2024-06-06 ENCOUNTER — Other Ambulatory Visit: Payer: Self-pay | Admitting: Internal Medicine

## 2024-06-06 DIAGNOSIS — N1831 Chronic kidney disease, stage 3a: Secondary | ICD-10-CM

## 2024-06-06 DIAGNOSIS — I10 Essential (primary) hypertension: Secondary | ICD-10-CM

## 2024-07-17 ENCOUNTER — Encounter: Payer: Self-pay | Admitting: Internal Medicine

## 2024-07-17 ENCOUNTER — Ambulatory Visit (INDEPENDENT_AMBULATORY_CARE_PROVIDER_SITE_OTHER): Payer: 59 | Admitting: Internal Medicine

## 2024-07-17 VITALS — BP 108/68 | HR 104 | Ht 72.0 in | Wt 276.8 lb

## 2024-07-17 DIAGNOSIS — E039 Hypothyroidism, unspecified: Secondary | ICD-10-CM

## 2024-07-17 DIAGNOSIS — E782 Mixed hyperlipidemia: Secondary | ICD-10-CM | POA: Diagnosis not present

## 2024-07-17 DIAGNOSIS — R351 Nocturia: Secondary | ICD-10-CM

## 2024-07-17 DIAGNOSIS — I10 Essential (primary) hypertension: Secondary | ICD-10-CM | POA: Diagnosis not present

## 2024-07-17 DIAGNOSIS — Z23 Encounter for immunization: Secondary | ICD-10-CM | POA: Diagnosis not present

## 2024-07-17 DIAGNOSIS — Z0001 Encounter for general adult medical examination with abnormal findings: Secondary | ICD-10-CM | POA: Diagnosis not present

## 2024-07-17 DIAGNOSIS — G8110 Spastic hemiplegia affecting unspecified side: Secondary | ICD-10-CM

## 2024-07-17 DIAGNOSIS — R7303 Prediabetes: Secondary | ICD-10-CM

## 2024-07-17 DIAGNOSIS — N1831 Chronic kidney disease, stage 3a: Secondary | ICD-10-CM | POA: Diagnosis not present

## 2024-07-17 DIAGNOSIS — N401 Enlarged prostate with lower urinary tract symptoms: Secondary | ICD-10-CM

## 2024-07-17 DIAGNOSIS — M7989 Other specified soft tissue disorders: Secondary | ICD-10-CM

## 2024-07-17 NOTE — Progress Notes (Unsigned)
 Established Patient Office Visit  Subjective:  Patient ID: Matthew Wise, male    DOB: [birth year]  Age: 63 y.o. MRN: [id number]  CC:  Chief Complaint  Patient presents with   Medical Management of Chronic Issues    6 month f/u , needs referral to [facility] for in home therapy.     HPI Matthew Wise is a 63 y.o. 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  HTN: BP is well controlled now.  He is taking amlodipine  5 mg daily and lisinopril  2.5 mg daily.  He denies any headache, dizziness, chest pain or palpitations.  CKD: He currently denies any dysuria, hematuria, urinary hesitancy or resistance.  He has followed up with Dr. Rachele.   H/o CVA: His weakness of the left UE and LE have improved since the last visit.  He is able to walk short distances in the home with the support.  He is dependent for most of his ADLs due to very limited movement of left UE and LE.  He would benefit from continuous personal care services.  He had laparoscopic appendectomy in 11/24.  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 two simple tubular adenomas removed. patient was advised to go to [facility] for colonoscopy but he did not keep appt.   IR CT HEAD LTD  02/02/2021   IR PERCUTANEOUS ART THROMBECTOMY/INFUSION INTRACRANIAL INC DIAG ANGIO  02/02/2021   RADIOLOGY WITH ANESTHESIA N/A 02/02/2021   Procedure: IR WITH ANESTHESIA;  Surgeon: Dolphus Carrion, MD;  Location: [facility] OR;  Service: Radiology;  Laterality: N/A;   TEE WITHOUT CARDIOVERSION N/A 09/10/2020   Procedure: TRANSESOPHAGEAL ECHOCARDIOGRAM (TEE) WITH PROPOFOL ;  Surgeon: Debera Jayson MATSU, MD;  Location: [facility]S;  Service: Cardiovascular;  Laterality: N/A;   XI ROBOTIC LAPAROSCOPIC ASSISTED APPENDECTOMY N/A 10/09/2023   Procedure: XI ROBOTIC LAPAROSCOPIC ASSISTED APPENDECTOMY;  Surgeon: Evonnie Dorothyann LABOR, DO;  Location: [facility];  Service: General;  Laterality: N/A;    Family History  Problem Relation Age of Onset   COPD Father    Stroke Brother    Pulmonary embolism Sister    Throat cancer Brother    Arthritis Mother    Diabetes Mother    Stroke Mother    Hypertension Brother    Diabetes Brother  Arthritis Brother    Diabetes Sister    Colon cancer Neg Hx     Social History   Socioeconomic History   Marital status: Divorced    Spouse name: Not on file   Number of children: 2   Years of education: 12   Highest education level: Not on file  Occupational History   Occupation: disability    Comment: DVT  Tobacco Use   Smoking status: Former    Current packs/day: 1.00    Average packs/day: 1 pack/day for 40.6 years (40.6 ttl pk-yrs)    Types: Cigarettes    Start date: 12/01/1983    Smokeless tobacco: Never  Vaping Use   Vaping status: Never Used  Substance and Sexual Activity   Alcohol use: Not Currently    Alcohol/week: 3.0 standard drinks of alcohol    Types: 3 Cans of beer per week    Comment: has not drank sice 11/27/20   Drug use: Not Currently    Comment: REMOTE HISTORY, Quit in 2007   Sexual activity: Yes  Other Topics Concern   Not on file  Social History Narrative   Lives with sister   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   Right Handed    Drinks caffeine on occassion   Social Drivers of Health   Financial Resource Strain: Low Risk  (12/21/2023)   Overall Financial Resource Strain (CARDIA)    Difficulty of Paying Living Expenses: Not hard at all  Food Insecurity: No Food Insecurity (12/21/2023)   Hunger Vital Sign    Worried About Running Out of Food in the Last Year: Never true    Ran Out of Food in the Last Year: Never true  Transportation Needs: No Transportation Needs (12/21/2023)   PRAPARE - Administrator, Civil Service (Medical): No    Lack of Transportation (Non-Medical): No  Physical Activity: Insufficiently Active (12/21/2023)   Exercise Vital Sign    Days of Exercise per Week: 1 day    Minutes of Exercise per Session: 20 min  Stress: No Stress Concern Present (12/21/2023)   [employer] of Occupational Health - Occupational Stress Questionnaire    Feeling of Stress : Not at all  Social Connections: Socially Isolated (12/21/2023)   Social Connection and Isolation Panel    Frequency of Communication with Friends and Family: More than three times a week    Frequency of Social Gatherings with Friends and Family: Three times a week    Attends Religious Services: Never    Active Member of Clubs or Organizations: No    Attends Banker Meetings: Never    Marital Status: Divorced  Catering manager Violence: Not At Risk (12/21/2023)   Humiliation, Afraid, Rape, and Kick questionnaire    Fear of Current or  Ex-Partner: No    Emotionally Abused: No    Physically Abused: No    Sexually Abused: No    Outpatient Medications Prior to Visit  Medication Sig Dispense Refill   amLODipine  (NORVASC ) 5 MG tablet Take 1 tablet (5 mg total) by mouth daily. 90 tablet 3   aspirin  EC 81 MG EC tablet Take 1 tablet (81 mg total) by mouth daily. Swallow whole. 30 tablet 11   atorvastatin  (LIPITOR) 40 MG tablet Take 1 tablet (40 mg total) by mouth daily. 90 tablet 3   levETIRAcetam  (KEPPRA ) 500 MG tablet Take 1 tablet (500 mg total) by mouth 2 (two) times daily. 180 tablet 3   levothyroxine  (SYNTHROID ) 25  MCG tablet Take 1 tablet by mouth once daily 90 tablet 0   lisinopril  (ZESTRIL ) 2.5 MG tablet Take 1 tablet by mouth once daily 90 tablet 0   Multiple Vitamin (MULTIVITAMIN) tablet Take 1 tablet by mouth daily.     pantoprazole  (PROTONIX ) 40 MG tablet Take 1 tablet (40 mg total) by mouth daily. 90 tablet 3   polyethylene glycol (MIRALAX  / GLYCOLAX ) 17 g packet Take 17 g by mouth daily.     potassium chloride  (KLOR-CON ) 20 MEQ packet Take 20 mEq by mouth daily. X 7 days     PRADAXA  150 MG CAPS capsule Take 1 capsule (150 mg total) by mouth every 12 (twelve) hours. 180 capsule 3   senna-docusate (SENOKOT-S) 8.6-50 MG tablet Take 1 tablet by mouth at bedtime.     tamsulosin  (FLOMAX ) 0.4 MG CAPS capsule TAKE 1 CAPSULE BY MOUTH ONCE DAILY AFTER SUPPER 90 capsule 0   No facility-administered medications prior to visit.    No Known Allergies  ROS Review of Systems  Constitutional:  Positive for fatigue. Negative for chills and fever.  HENT:  Negative for congestion, postnasal drip, rhinorrhea and sore throat.   Eyes:  Negative for pain and discharge.  Respiratory:  Negative for cough and shortness of breath.   Cardiovascular:  Negative for chest pain and palpitations.  Gastrointestinal:  Positive for constipation. Negative for diarrhea, nausea and vomiting.  Endocrine: Negative for polydipsia and polyuria.   Genitourinary:  Negative for dysuria and hematuria.  Musculoskeletal:  Positive for arthralgias and neck stiffness. Negative for neck pain.  Skin:  Negative for rash.  Neurological:  Positive for weakness and numbness. Negative for dizziness and headaches.  Psychiatric/Behavioral:  Negative for agitation and behavioral problems.       Objective:    Physical Exam Vitals reviewed.  Constitutional:      General: He is not in acute distress.    Appearance: He is not diaphoretic.     Comments: In wheelchair  HENT:     Head: Normocephalic and atraumatic.     Nose: No congestion.     Mouth/Throat:     Mouth: Mucous membranes are moist.  Eyes:     General: No scleral icterus.    Extraocular Movements: Extraocular movements intact.  Cardiovascular:     Rate and Rhythm: Normal rate and regular rhythm.     Pulses: Normal pulses.     Heart sounds: Normal heart sounds. No murmur heard. Pulmonary:     Breath sounds: Normal breath sounds. No wheezing or rales.  Musculoskeletal:     Cervical back: Neck supple. No tenderness.     Right lower leg: No edema.     Left lower leg: No edema.  Skin:    General: Skin is warm.     Findings: No rash.  Neurological:     General: No focal deficit present.     Mental Status: He is alert and oriented to person, place, and time.     Sensory: Sensory deficit (Left sided hemiparesis) present.     Motor: Weakness (Left UE and LE 2/5) present.  Psychiatric:        Mood and Affect: Mood normal.        Behavior: Behavior normal.     BP 108/68   Pulse (!) 104   Ht 6' (1.829 m)   Wt 276 lb 12.8 oz (125.6 kg)   SpO2 93%   BMI 37.54 kg/m  Wt Readings from Last 3 Encounters:  07/17/24  276 lb 12.8 oz (125.6 kg)  01/17/24 270 lb 12.8 oz (122.8 kg)  12/21/23 260 lb (117.9 kg)    Lab Results  Component Value Date   TSH 4.210 01/17/2024   Lab Results  Component Value Date   WBC 3.5 01/17/2024   HGB 13.0 01/17/2024   HCT 40.8 01/17/2024   MCV  84 01/17/2024   PLT 196 01/17/2024   Lab Results  Component Value Date   NA 141 01/17/2024   K 4.1 01/17/2024   CO2 25 01/17/2024   GLUCOSE 84 01/17/2024   BUN 9 01/17/2024   CREATININE 1.33 (H) 01/17/2024   BILITOT 0.5 01/17/2024   ALKPHOS 149 (H) 01/17/2024   AST 20 01/17/2024   ALT 28 01/17/2024   PROT 6.7 01/17/2024   ALBUMIN 3.9 01/17/2024   CALCIUM  9.0 01/17/2024   ANIONGAP 9 10/15/2023   EGFR 61 01/17/2024   Lab Results  Component Value Date   CHOL 130 01/17/2024   Lab Results  Component Value Date   HDL 57 01/17/2024   Lab Results  Component Value Date   LDLCALC 61 01/17/2024   Lab Results  Component Value Date   TRIG 52 01/17/2024   Lab Results  Component Value Date   CHOLHDL 2.3 01/17/2024   Lab Results  Component Value Date   HGBA1C 5.8 (H) 01/17/2024      Assessment & Plan:   Problem List Items Addressed This Visit   None      Follow-up: No follow-ups on file.    Suzzane MARLA Blanch, MD

## 2024-07-17 NOTE — Assessment & Plan Note (Signed)
 Overall well-controlled  On Flomax 

## 2024-07-17 NOTE — Assessment & Plan Note (Signed)
 CVA in 10/2020 and 01/2021, s/p thrombectomy Has residual left-sided hemiplegia and hemiparesis  Wheelchair-bound, ambulates short distances at home On aspirin , statin and Pradaxa  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 Has not seen cardiology in outpatient setting - has had TEE, negative for interatrial shunt Referred to Home health for PT due to recent worsening of LE weakness

## 2024-07-17 NOTE — Assessment & Plan Note (Signed)
 Lab Results  Component Value Date   TSH 6.890 (H) 07/17/2024   On Levothyroxine  25 mcg QD Check TSH and free T4 - increased dose of levothyroxine  to 50 mcg QD

## 2024-07-17 NOTE — Assessment & Plan Note (Signed)
 Last BMP showed GFR - 54 Proteinuria improving Followed by nephrology - Dr. Rachele On ACEi Avoid nephrotoxic agents Check CMP

## 2024-07-17 NOTE — Assessment & Plan Note (Signed)
 BP Readings from Last 1 Encounters:  07/17/24 108/68   Well-controlled with amlodipine  5 mg QD and lisinopril  2.5 mg QD Counseled for compliance with the medications Advised DASH diet

## 2024-07-17 NOTE — Assessment & Plan Note (Signed)
Physical exam as documented. Fasting blood tests ordered. Advised to get Shingrix vaccines at local pharmacy.

## 2024-07-17 NOTE — Patient Instructions (Signed)
 Please continue to take medications as prescribed.  Please continue to follow low salt diet and ambulate as tolerated.  Please perform leg elevation and use compression socks as tolerated.

## 2024-07-18 ENCOUNTER — Ambulatory Visit: Payer: Self-pay | Admitting: Internal Medicine

## 2024-07-18 DIAGNOSIS — M7989 Other specified soft tissue disorders: Secondary | ICD-10-CM | POA: Insufficient documentation

## 2024-07-18 LAB — CBC WITH DIFFERENTIAL/PLATELET
Basophils Absolute: 0 x10E3/uL (ref 0.0–0.2)
Basos: 1 %
EOS (ABSOLUTE): 0 x10E3/uL (ref 0.0–0.4)
Eos: 1 %
Hematocrit: 41.8 % (ref 37.5–51.0)
Hemoglobin: 13.6 g/dL (ref 13.0–17.7)
Immature Grans (Abs): 0 x10E3/uL (ref 0.0–0.1)
Immature Granulocytes: 0 %
Lymphocytes Absolute: 1.6 x10E3/uL (ref 0.7–3.1)
Lymphs: 37 %
MCH: 27.4 pg (ref 26.6–33.0)
MCHC: 32.5 g/dL (ref 31.5–35.7)
MCV: 84 fL (ref 79–97)
Monocytes Absolute: 0.3 x10E3/uL (ref 0.1–0.9)
Monocytes: 7 %
Neutrophils Absolute: 2.3 x10E3/uL (ref 1.4–7.0)
Neutrophils: 54 %
Platelets: 237 x10E3/uL (ref 150–450)
RBC: 4.97 x10E6/uL (ref 4.14–5.80)
RDW: 13.8 % (ref 11.6–15.4)
WBC: 4.2 x10E3/uL (ref 3.4–10.8)

## 2024-07-18 LAB — CMP14+EGFR
ALT: 18 IU/L (ref 0–44)
AST: 22 IU/L (ref 0–40)
Albumin: 4.2 g/dL (ref 3.9–4.9)
Alkaline Phosphatase: 152 IU/L — ABNORMAL HIGH (ref 44–121)
BUN/Creatinine Ratio: 6 — ABNORMAL LOW (ref 10–24)
BUN: 10 mg/dL (ref 8–27)
Bilirubin Total: 0.8 mg/dL (ref 0.0–1.2)
CO2: 23 mmol/L (ref 20–29)
Calcium: 9.3 mg/dL (ref 8.6–10.2)
Chloride: 102 mmol/L (ref 96–106)
Creatinine, Ser: 1.54 mg/dL — ABNORMAL HIGH (ref 0.76–1.27)
Globulin, Total: 3.2 g/dL (ref 1.5–4.5)
Glucose: 110 mg/dL — ABNORMAL HIGH (ref 70–99)
Potassium: 3.5 mmol/L (ref 3.5–5.2)
Sodium: 140 mmol/L (ref 134–144)
Total Protein: 7.4 g/dL (ref 6.0–8.5)
eGFR: 51 mL/min/1.73 — ABNORMAL LOW (ref >59)

## 2024-07-18 LAB — HEMOGLOBIN A1C
Est. average glucose Bld gHb Est-mCnc: 123 mg/dL
Hgb A1c MFr Bld: 5.9 % — ABNORMAL HIGH (ref 4.8–5.6)

## 2024-07-18 LAB — VITAMIN D 25 HYDROXY (VIT D DEFICIENCY, FRACTURES): Vit D, 25-Hydroxy: 44.4 ng/mL (ref 30.0–100.0)

## 2024-07-18 LAB — LIPID PANEL
Chol/HDL Ratio: 2.5 ratio (ref 0.0–5.0)
Cholesterol, Total: 113 mg/dL (ref 100–199)
HDL: 46 mg/dL (ref >39)
LDL Chol Calc (NIH): 51 mg/dL (ref 0–99)
Triglycerides: 81 mg/dL (ref 0–149)
VLDL Cholesterol Cal: 16 mg/dL (ref 5–40)

## 2024-07-18 LAB — TSH+FREE T4
Free T4: 1.56 ng/dL (ref 0.82–1.77)
TSH: 6.89 u[IU]/mL — ABNORMAL HIGH (ref 0.450–4.500)

## 2024-07-18 LAB — PSA: Prostate Specific Ag, Serum: 0.7 ng/mL (ref 0.0–4.0)

## 2024-07-18 MED ORDER — LEVOTHYROXINE SODIUM 50 MCG PO TABS: 50.0000 ug | ORAL_TABLET | Freq: Every day | ORAL | 1 refills | Status: AC

## 2024-07-18 NOTE — Assessment & Plan Note (Signed)
 Has chronic dependent edema Advised to perform leg elevation and use compression socks as tolerated Referred to home health for PT

## 2024-07-18 NOTE — Assessment & Plan Note (Signed)
 Lab Results  Component Value Date   HGBA1C 5.9 (H) 07/17/2024   Advised to follow low carb diet for now

## 2024-07-18 NOTE — Assessment & Plan Note (Signed)
 On statin Check lipid profile

## 2024-07-19 ENCOUNTER — Telehealth: Payer: Self-pay

## 2024-07-19 NOTE — Telephone Encounter (Signed)
Pt informed

## 2024-07-19 NOTE — Telephone Encounter (Signed)
 Copied from CRM #[id number]. Topic: Referral - Request for Referral >> Jul 19, 2024 11:59 AM Everette C wrote: Did the patient discuss referral with their provider in the last year? Yes (If No - schedule appointment) (If Yes - send message)  Appointment offered? No  Type of order/referral and detailed reason for visit: Physical Therapy   Preference of office, provider, location: [facility]   If referral order, have you been seen by this specialty before? No (If Yes, this issue or another issue? When? Where?  Can we respond through MyChart? No

## 2024-07-19 NOTE — Telephone Encounter (Signed)
 Home Health Referral has been placed and I will reach out to [facility] to see if they are able to accept Patient.

## 2024-07-24 NOTE — Telephone Encounter (Unsigned)
 Copied from CRM #[id number]. Topic: Referral - Question >> Jul 24, 2024  4:44 PM Selinda RAMAN wrote: Reason for CRM: The patient called in asking why his referral for Home Health was sent to [facility] and not [facility] as he requested. PLease assist patient further.

## 2024-07-25 NOTE — Telephone Encounter (Signed)
 R/C to Patient.  Spoke with Patient and made Patient aware that [facility] is no longer accepting [facility].  Patient voiced understanding.

## 2024-07-28 DIAGNOSIS — I129 Hypertensive chronic kidney disease with stage 1 through stage 4 chronic kidney disease, or unspecified chronic kidney disease: Secondary | ICD-10-CM | POA: Diagnosis not present

## 2024-07-28 DIAGNOSIS — K5909 Other constipation: Secondary | ICD-10-CM | POA: Diagnosis not present

## 2024-07-28 DIAGNOSIS — E039 Hypothyroidism, unspecified: Secondary | ICD-10-CM | POA: Diagnosis not present

## 2024-07-28 DIAGNOSIS — N1831 Chronic kidney disease, stage 3a: Secondary | ICD-10-CM | POA: Diagnosis not present

## 2024-07-28 DIAGNOSIS — I69354 Hemiplegia and hemiparesis following cerebral infarction affecting left non-dominant side: Secondary | ICD-10-CM | POA: Diagnosis not present

## 2024-07-28 DIAGNOSIS — Z87891 Personal history of nicotine dependence: Secondary | ICD-10-CM | POA: Diagnosis not present

## 2024-08-03 DIAGNOSIS — Z87891 Personal history of nicotine dependence: Secondary | ICD-10-CM | POA: Diagnosis not present

## 2024-08-03 DIAGNOSIS — K5909 Other constipation: Secondary | ICD-10-CM | POA: Diagnosis not present

## 2024-08-03 DIAGNOSIS — N1831 Chronic kidney disease, stage 3a: Secondary | ICD-10-CM | POA: Diagnosis not present

## 2024-08-03 DIAGNOSIS — I129 Hypertensive chronic kidney disease with stage 1 through stage 4 chronic kidney disease, or unspecified chronic kidney disease: Secondary | ICD-10-CM | POA: Diagnosis not present

## 2024-08-03 DIAGNOSIS — E039 Hypothyroidism, unspecified: Secondary | ICD-10-CM | POA: Diagnosis not present

## 2024-08-03 DIAGNOSIS — I69354 Hemiplegia and hemiparesis following cerebral infarction affecting left non-dominant side: Secondary | ICD-10-CM | POA: Diagnosis not present

## 2024-08-07 ENCOUNTER — Other Ambulatory Visit: Payer: Self-pay | Admitting: Internal Medicine

## 2024-08-07 DIAGNOSIS — N401 Enlarged prostate with lower urinary tract symptoms: Secondary | ICD-10-CM

## 2024-08-09 NOTE — Telephone Encounter (Unsigned)
 Copied from CRM [id number]. Topic: Clinical - Home Health Verbal Orders >> Aug 09, 2024  8:55 AM Wyona SQUIBB wrote: Caller/Agency: Rosaline Occupational therapist [facility] Callback Number: [phone] - Secured Line  Service Requested: Occupational Therapy Frequency: 1 time  a week for 5 weeks.  Any new concerns about the patient? No

## 2024-08-10 DIAGNOSIS — Z87891 Personal history of nicotine dependence: Secondary | ICD-10-CM | POA: Diagnosis not present

## 2024-08-10 DIAGNOSIS — I129 Hypertensive chronic kidney disease with stage 1 through stage 4 chronic kidney disease, or unspecified chronic kidney disease: Secondary | ICD-10-CM | POA: Diagnosis not present

## 2024-08-10 DIAGNOSIS — E039 Hypothyroidism, unspecified: Secondary | ICD-10-CM | POA: Diagnosis not present

## 2024-08-10 DIAGNOSIS — N1831 Chronic kidney disease, stage 3a: Secondary | ICD-10-CM | POA: Diagnosis not present

## 2024-08-10 DIAGNOSIS — I69354 Hemiplegia and hemiparesis following cerebral infarction affecting left non-dominant side: Secondary | ICD-10-CM | POA: Diagnosis not present

## 2024-08-15 DIAGNOSIS — E039 Hypothyroidism, unspecified: Secondary | ICD-10-CM | POA: Diagnosis not present

## 2024-08-15 DIAGNOSIS — Z87891 Personal history of nicotine dependence: Secondary | ICD-10-CM | POA: Diagnosis not present

## 2024-08-15 DIAGNOSIS — I129 Hypertensive chronic kidney disease with stage 1 through stage 4 chronic kidney disease, or unspecified chronic kidney disease: Secondary | ICD-10-CM | POA: Diagnosis not present

## 2024-08-15 DIAGNOSIS — K5909 Other constipation: Secondary | ICD-10-CM | POA: Diagnosis not present

## 2024-08-15 DIAGNOSIS — I69354 Hemiplegia and hemiparesis following cerebral infarction affecting left non-dominant side: Secondary | ICD-10-CM | POA: Diagnosis not present

## 2024-08-17 DIAGNOSIS — N1831 Chronic kidney disease, stage 3a: Secondary | ICD-10-CM | POA: Diagnosis not present

## 2024-08-20 ENCOUNTER — Other Ambulatory Visit: Payer: Self-pay | Admitting: Internal Medicine

## 2024-08-20 DIAGNOSIS — E038 Other specified hypothyroidism: Secondary | ICD-10-CM

## 2024-08-23 ENCOUNTER — Ambulatory Visit

## 2024-08-23 ENCOUNTER — Ambulatory Visit: Payer: Self-pay | Admitting: *Deleted

## 2024-08-23 VITALS — BP 112/75 | HR 75 | Resp 18 | Ht 72.0 in | Wt 276.0 lb

## 2024-08-23 DIAGNOSIS — R300 Dysuria: Secondary | ICD-10-CM | POA: Diagnosis not present

## 2024-08-23 MED ORDER — CEPHALEXIN 500 MG PO CAPS: 500.0000 mg | ORAL_CAPSULE | Freq: Three times a day (TID) | ORAL | 0 refills | Status: AC

## 2024-08-23 NOTE — Telephone Encounter (Signed)
 Appt scheduled

## 2024-08-23 NOTE — Progress Notes (Signed)
 Established Patient Office Visit  Subjective   Patient ID: Matthew Wise, male    DOB: July 03, 1961  Age: 63 y.o. MRN: [id number]  Chief Complaint  Patient presents with   Urinary Tract Infection    Complains of burning, urgency, and frequency in urination for last couple days. Denies blood or fever. Dark in color and strong odor     HPI  Patient Active Problem List   Diagnosis Date Noted   Leg swelling 07/18/2024   Postoperative ileus (HCC) 10/13/2023   Acute pancreatitis 10/08/2023   Skin abscess 10/01/2023   Acute non-recurrent sinusitis 01/15/2023   Gastroesophageal reflux disease 01/15/2023   Seizure with onset of stroke (HCC) 01/15/2023   Encounter for general adult medical examination with abnormal findings 07/06/2022   Stage 3a chronic kidney disease (HCC) 07/06/2022   Hospital discharge follow-up 04/30/2022   Obesity, Class I, BMI 30-34.9 04/25/2022   Generalized weakness    Near syncope    Non-seasonal allergic rhinitis 03/25/2022   Encounter for examination following treatment at hospital 02/27/2022   BPH (benign prostatic hyperplasia) 09/24/2021   History of CVA with residual deficit 09/24/2021   Dysphagia, post-stroke    Spastic hemiplegia affecting nondominant side (HCC)    Hemiparesis affecting left side as late effect of stroke (HCC)    Thyroid  nodule 01/25/2021   Hypothyroidism 01/25/2021   H/O adenomatous polyp of colon 12/30/2020   Essential hypertension 11/28/2020   Mixed hyperlipidemia 11/28/2020   H/O medication noncompliance 09/07/2020   Nicotine  dependence 12/05/2019   Prediabetes 10/24/2019   Tubular adenoma of colon 09/14/2017   Long term (current) use of anticoagulants 11/14/2014   Sickle cell trait 08/29/2012   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  Constitutional:      Appearance: Normal appearance.  HENT:     Head: Normocephalic.  Eyes:     Extraocular Movements: Extraocular movements intact.     Pupils: Pupils are equal, round, and reactive to light.  Cardiovascular:     Rate and Rhythm: Normal rate and regular rhythm.  Pulmonary:     Effort: Pulmonary effort is normal.     Breath sounds: Normal breath sounds.  Musculoskeletal:     Cervical back: Normal range of motion and neck supple.  Neurological:     Mental Status: He is alert and oriented to person, place, and time.  Psychiatric:        Mood and Affect: Mood normal.        Thought Content: Thought content normal.       The ASCVD Risk score (Arnett DK, et al., 2019) failed to calculate for the following reasons:   Risk score cannot be calculated because patient has a medical history suggesting prior/existing ASCVD    Assessment & Plan:   Problem List Items Addressed This Visit   None Visit Diagnoses       Dysuria    -  Primary   Treat with oral antibiotic while urine culture pending.   Relevant Medications   cephALEXin  (KEFLEX ) 500 MG capsule   Other Relevant Orders   Urine Culture (Completed)       No follow-ups on file.    Leita Longs, FNP

## 2024-08-23 NOTE — Telephone Encounter (Signed)
 FYI Only or Action Required?: FYI only for provider.  Patient was last seen in primary care on 07/17/2024 by Tobie Suzzane POUR, MD.  Called Nurse Triage reporting Dysuria.  Symptoms began several days ago.  Interventions attempted: Nothing.  Symptoms are: gradually worsening.  Triage Disposition: See Physician Within 24 Hours  Patient/caregiver understands and will follow disposition?: Yes           Copied from CRM [id number]. Topic: Clinical - Red Word Triage >> Aug 23, 2024  8:48 AM Antwanette L wrote: Red Word that prompted transfer to Nurse Triage: Possible uti.Patient reports experiencing frequent urination accompanied by pain and burning sensation. Urine appears dark in color and has a strong odor. Reason for Disposition  All other males with painful urination  Answer Assessment - Initial Assessment Questions Patient unable to make appt offered this am with PCP due to time of call and time of appt. Patient requires transportation. Able to schedule patient today with other provider.      1. SEVERITY: How bad is the pain?  (e.g., Scale 1-10; mild, moderate, or severe)     Mild  2. FREQUENCY: How many times have you had painful urination today?      everytime 3. PATTERN: Is pain present every time you urinate or just sometimes?      everytime 4. ONSET: When did the painful urination start?      2 days  5. FEVER: Do you have a fever? If Yes, ask: What is your temperature, how was it measured, and when did it start?     na 6. PAST UTI: Have you had a urine infection before? If Yes, ask: When was the last time? and What happened that time?      UTI  7. CAUSE: What do you think is causing the painful urination?      UTI 8. OTHER SYMPTOMS: Do you have any other symptoms? (e.g., flank pain, penis discharge, scrotal pain, blood in urine)     Dark urine, odor, urinary frequency pain . Denies back or flank pain.  Protocols used: Urination Pain -  Male-A-AH

## 2024-08-24 DIAGNOSIS — I69354 Hemiplegia and hemiparesis following cerebral infarction affecting left non-dominant side: Secondary | ICD-10-CM | POA: Diagnosis not present

## 2024-08-24 DIAGNOSIS — K5909 Other constipation: Secondary | ICD-10-CM | POA: Diagnosis not present

## 2024-08-24 DIAGNOSIS — R300 Dysuria: Secondary | ICD-10-CM | POA: Diagnosis not present

## 2024-08-24 DIAGNOSIS — I129 Hypertensive chronic kidney disease with stage 1 through stage 4 chronic kidney disease, or unspecified chronic kidney disease: Secondary | ICD-10-CM | POA: Diagnosis not present

## 2024-08-24 DIAGNOSIS — E039 Hypothyroidism, unspecified: Secondary | ICD-10-CM | POA: Diagnosis not present

## 2024-08-24 DIAGNOSIS — N1831 Chronic kidney disease, stage 3a: Secondary | ICD-10-CM | POA: Diagnosis not present

## 2024-08-26 LAB — URINE CULTURE

## 2024-08-27 ENCOUNTER — Ambulatory Visit: Payer: Self-pay

## 2024-08-28 ENCOUNTER — Ambulatory Visit: Payer: Self-pay

## 2024-08-28 ENCOUNTER — Telehealth: Payer: Self-pay

## 2024-08-28 ENCOUNTER — Other Ambulatory Visit: Payer: Self-pay

## 2024-08-28 DIAGNOSIS — R32 Unspecified urinary incontinence: Secondary | ICD-10-CM

## 2024-08-28 DIAGNOSIS — R310 Gross hematuria: Secondary | ICD-10-CM

## 2024-08-28 NOTE — Telephone Encounter (Signed)
 Pt given lab results per notes of Leita Hammers, NP on 08/27/24. Pt verbalized understanding.

## 2024-08-28 NOTE — Telephone Encounter (Signed)
 FYI Only or Action Required?: Action required by provider: clinical question for provider.  Patient was last seen in primary care on 08/23/2024 by Bevely Doffing, FNP.  Called Nurse Triage reporting Advice Only.  Symptoms began several days ago.  Interventions attempted: Prescription medications: Keflex , tamsulosin .  Symptoms are: unchanged.  Triage Disposition: Call PCP When Office is Open  Patient/caregiver understands and will follow disposition?: Yes   Copied from CRM #[id number]. Topic: Clinical - Red Word Triage >> Aug 28, 2024  9:02 AM Mia F wrote: Red Word that prompted transfer to Nurse Triage: Currently being treated for a UTI but the antibiotics does not seem to be working and symptoms seem to be worsening. Urgency & bruning sensation when urinating. Been on anitibiotics for about 4-5 days. Reason for Disposition  [1] Caller has NON-URGENT medicine question about med that PCP prescribed AND [2] triager unable to answer question  Answer Assessment - Initial Assessment Questions Patient calling saying that his UTI symptoms (urgency, leaking urine) are not improving. He says on yesterday saw a blood in the urine when he got up very noticeable turned the water  red, after that the urine lighted up and saw more later on, none so far today. Advised of results from urine culture. He says the antibiotic is not helping, burning is better. Advised no availability in the office, so I will send this to Doffing or PCP and someone will call with the recommendation. Advised continue with antibiotic as prescribed. He verbalized understanding.   1. NAME of MEDICINE: What medicine(s) are you calling about?     Keflex  2. QUESTION: What is your question? (e.g., double dose of medicine, side effect)     The medication doesn't seem to be working 3. PRESCRIBER: Who prescribed the medicine? Reason: if prescribed by specialist, call should be referred to that group.     Doffing, NP 4. SYMPTOMS: Do  you have any symptoms? If Yes, ask: What symptoms are you having?  How bad are the symptoms (e.g., mild, moderate, severe)     Burning with urination is better, still have urgency, leaking  Protocols used: Medication Question Call-A-AH

## 2024-08-28 NOTE — Telephone Encounter (Signed)
 Copied from CRM [id number]. Topic: Appointments - Appointment Info/Confirmation >> Aug 25, 2024  4:35 PM [name] wrote: Patient/patient representative is calling for information regarding an appointment.  [facility] called and stated that the pt missed his appt today. He was not feeling well.   Phone [phone]  ----------------------------------------------------------------------- From previous Reason for Contact - Home Health Verbal Orders: Caller/Agency: [facility] Callback Number: [phone] Service Requested:   Frequency:  Any new concerns about the patient?

## 2024-08-29 NOTE — Telephone Encounter (Signed)
 Pt advised with verbal understanding

## 2024-08-30 ENCOUNTER — Ambulatory Visit: Payer: 59 | Admitting: Adult Health

## 2024-08-31 ENCOUNTER — Other Ambulatory Visit: Payer: Self-pay | Admitting: Internal Medicine

## 2024-08-31 DIAGNOSIS — N1831 Chronic kidney disease, stage 3a: Secondary | ICD-10-CM

## 2024-08-31 DIAGNOSIS — I10 Essential (primary) hypertension: Secondary | ICD-10-CM

## 2024-09-04 ENCOUNTER — Ambulatory Visit: Admitting: Adult Health

## 2024-09-07 DIAGNOSIS — I10 Essential (primary) hypertension: Secondary | ICD-10-CM | POA: Diagnosis not present

## 2024-09-07 DIAGNOSIS — E119 Type 2 diabetes mellitus without complications: Secondary | ICD-10-CM | POA: Diagnosis not present

## 2024-09-07 DIAGNOSIS — N189 Chronic kidney disease, unspecified: Secondary | ICD-10-CM | POA: Diagnosis not present

## 2024-09-07 DIAGNOSIS — R809 Proteinuria, unspecified: Secondary | ICD-10-CM | POA: Diagnosis not present

## 2024-09-07 DIAGNOSIS — D631 Anemia in chronic kidney disease: Secondary | ICD-10-CM | POA: Diagnosis not present

## 2024-09-12 ENCOUNTER — Other Ambulatory Visit: Payer: Self-pay | Admitting: Adult Health

## 2024-09-12 DIAGNOSIS — I639 Cerebral infarction, unspecified: Secondary | ICD-10-CM

## 2024-09-12 NOTE — Telephone Encounter (Signed)
 Last filled by patient on 06/14/24 Last office visit : 08/31/23 Next office visit :  03/27/25 Continue Keppra  500mg  BID for seizure prophylaxis

## 2024-09-15 DIAGNOSIS — I129 Hypertensive chronic kidney disease with stage 1 through stage 4 chronic kidney disease, or unspecified chronic kidney disease: Secondary | ICD-10-CM | POA: Diagnosis not present

## 2024-09-15 DIAGNOSIS — N1831 Chronic kidney disease, stage 3a: Secondary | ICD-10-CM | POA: Diagnosis not present

## 2024-09-15 DIAGNOSIS — R809 Proteinuria, unspecified: Secondary | ICD-10-CM | POA: Diagnosis not present

## 2024-11-05 ENCOUNTER — Other Ambulatory Visit: Payer: Self-pay | Admitting: Internal Medicine

## 2024-11-05 DIAGNOSIS — N401 Enlarged prostate with lower urinary tract symptoms: Secondary | ICD-10-CM

## 2024-11-26 ENCOUNTER — Other Ambulatory Visit: Payer: Self-pay | Admitting: Internal Medicine

## 2024-11-26 DIAGNOSIS — N1831 Chronic kidney disease, stage 3a: Secondary | ICD-10-CM

## 2024-11-26 DIAGNOSIS — I10 Essential (primary) hypertension: Secondary | ICD-10-CM

## 2024-12-26 ENCOUNTER — Ambulatory Visit: Payer: 59

## 2024-12-26 VITALS — BP 123/72 | Ht 72.0 in | Wt 275.0 lb

## 2024-12-26 DIAGNOSIS — Z0001 Encounter for general adult medical examination with abnormal findings: Secondary | ICD-10-CM

## 2024-12-26 DIAGNOSIS — F17210 Nicotine dependence, cigarettes, uncomplicated: Secondary | ICD-10-CM | POA: Diagnosis not present

## 2024-12-26 DIAGNOSIS — Z0000 Encounter for general adult medical examination without abnormal findings: Secondary | ICD-10-CM

## 2024-12-26 NOTE — Progress Notes (Signed)
 "  Chief Complaint  Patient presents with   Medicare Wellness     Subjective:   Matthew Wise is a 64 y.o. male who presents for a Medicare Annual Wellness Visit.  Visit info / Clinical Intake: Medicare Wellness Visit Type:: Subsequent Annual Wellness Visit Persons participating in visit and providing information:: patient Medicare Wellness Visit Mode:: Telephone If telephone:: video declined Since this visit was completed virtually, some vitals may be partially provided or unavailable. Missing vitals are due to the limitations of the virtual format.: Documented vitals are patient reported If Telephone or Video please confirm:: I connected with patient using audio/video enable telemedicine. I verified patient identity with two identifiers, discussed telehealth limitations, and patient agreed to proceed. Patient Location:: home Provider Location:: home office Interpreter Needed?: No Pre-visit prep was completed: yes AWV questionnaire completed by patient prior to visit?: no Living arrangements:: with family/others Patient's Overall Health Status Rating: good Typical amount of pain: none Does pain affect daily life?: no Are you currently prescribed opioids?: no  Dietary Habits and Nutritional Risks How many meals a day?: 3 Eats fruit and vegetables daily?: yes Most meals are obtained by: having others provide food In the last 2 weeks, have you had any of the following?: none Diabetic:: no  Functional Status Activities of Daily Living (to include ambulation/medication): (!) Needs Assist Feeding: Independent Dressing/Grooming: Independent Bathing: Needs assistance Toileting: Independent Transfer: Independent with device- listed below Ambulation: Independent with device- listed below Home Assistive Devices/Equipment: Cane Medication Administration: Needs assistance (comment) Is this a change from baseline?: Pre-admission baseline Home Management (perform basic housework or  laundry): Needs assistance (comment) Manage your own finances?: yes (has an aid that helps him with this) Primary transportation is: family / friends Concerns about vision?: no *vision screening is required for [facility]* Concerns about hearing?: no  Fall Screening Falls in the past year?: 0 Number of falls in past year: 0 Was there an injury with Fall?: 0 Fall Risk Category Calculator: 0 Patient Fall Risk Level: Low Fall Risk  Fall Risk Patient at Risk for Falls Due to: Impaired mobility; Impaired balance/gait Fall risk Follow up: Falls evaluation completed; Education provided; Falls prevention discussed  Home and Transportation Safety: All rugs have non-skid backing?: yes All stairs or steps have railings?: yes (patient uses a ramp) Grab bars in the bathtub or shower?: (!) no (patient's aid assist him with washing up.he is afraid to get in the bath tub.) Have non-skid surface in bathtub or shower?: (!) no (patient doesnt get in the shower since having a stroke) Good home lighting?: yes Regular seat belt use?: yes Hospital stays in the last year:: no  Cognitive Assessment Difficulty concentrating, remembering, or making decisions? : no Will 6CIT or Mini Cog be Completed: yes What year is it?: 0 points What month is it?: 0 points Give patient an address phrase to remember (5 components): [address] [facility] [place] About what time is it?: 0 points Count backwards from 20 to 1: 0 points Say the months of the year in reverse: 0 points Repeat the address phrase from earlier: 2 points 6 CIT Score: 2 points  Advance Directives (For Healthcare) Does Patient Have a Medical Advance Directive?: No Would patient like information on creating a medical advance directive?: No - Patient declined  Reviewed/Updated  Reviewed/Updated: Reviewed All (Medical, Surgical, Family, Medications, Allergies, Care Teams, Patient Goals)    Allergies (verified) Patient has no known allergies.   Current  Medications (verified) Outpatient Encounter Medications as  of 12/26/2024  Medication Sig   aspirin  EC 81 MG EC tablet Take 1 tablet (81 mg total) by mouth daily. Swallow whole.   atorvastatin  (LIPITOR) 40 MG tablet Take 1 tablet (40 mg total) by mouth daily.   candesartan (ATACAND) 4 MG tablet Take 4 mg by mouth daily.   levETIRAcetam  (KEPPRA ) 500 MG tablet Take 1 tablet by mouth twice daily   levothyroxine  (SYNTHROID ) 50 MCG tablet Take 1 tablet (50 mcg total) by mouth daily before breakfast.   lisinopril  (ZESTRIL ) 2.5 MG tablet Take 1 tablet by mouth once daily   Multiple Vitamin (MULTIVITAMIN) tablet Take 1 tablet by mouth daily.   pantoprazole  (PROTONIX ) 40 MG tablet Take 1 tablet (40 mg total) by mouth daily.   polyethylene glycol (MIRALAX  / GLYCOLAX ) 17 g packet Take 17 g by mouth daily.   potassium chloride  (KLOR-CON ) 20 MEQ packet Take 20 mEq by mouth daily. X 7 days   PRADAXA  150 MG CAPS capsule Take 1 capsule (150 mg total) by mouth every 12 (twelve) hours.   senna-docusate (SENOKOT-S) 8.6-50 MG tablet Take 1 tablet by mouth at bedtime.   tamsulosin  (FLOMAX ) 0.4 MG CAPS capsule TAKE 1 CAPSULE BY MOUTH ONCE DAILY AFTER SUPPER   amLODipine  (NORVASC ) 5 MG tablet Take 1 tablet (5 mg total) by mouth daily. (Patient not taking: Reported on 12/26/2024)   No facility-administered encounter medications on file as of 12/26/2024.    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 two simple tubular adenomas removed. patient was advised to go to [facility] for colonoscopy but he did not keep appt.   IR CT HEAD LTD  02/02/2021   IR PERCUTANEOUS ART THROMBECTOMY/INFUSION INTRACRANIAL INC DIAG ANGIO  02/02/2021   RADIOLOGY WITH ANESTHESIA N/A 02/02/2021   Procedure: IR WITH ANESTHESIA;  Surgeon: Dolphus Carrion, MD;  Location: [facility] OR;  Service: Radiology;  Laterality: N/A;   TEE WITHOUT CARDIOVERSION N/A 09/10/2020   Procedure: TRANSESOPHAGEAL ECHOCARDIOGRAM (TEE) WITH PROPOFOL ;  Surgeon: Debera Jayson MATSU, MD;  Location: [facility] ORS;  Service: Cardiovascular;  Laterality: N/A;   XI ROBOTIC LAPAROSCOPIC ASSISTED APPENDECTOMY N/A 10/09/2023   Procedure: XI ROBOTIC LAPAROSCOPIC ASSISTED APPENDECTOMY;  Surgeon: Evonnie Dorothyann LABOR, DO;  Location: [facility] ORS;  Service: General;  Laterality: N/A;   Family History  Problem Relation Age of Onset   COPD Father    Stroke Brother    Pulmonary embolism Sister    Throat cancer Brother  Arthritis Mother    Diabetes Mother    Stroke Mother    Hypertension Brother    Diabetes Brother    Arthritis Brother    Diabetes Sister    Colon cancer Neg Hx    Social History   Occupational History   Occupation: disability    Comment: DVT  Tobacco Use   Smoking status: Former    Current packs/day: 1.00    Average packs/day: 1 pack/day for 41.1 years (41.1 ttl pk-yrs)    Types: Cigarettes    Start date:  12/01/1983   Smokeless tobacco: Never  Vaping Use   Vaping status: Never Used  Substance and Sexual Activity   Alcohol use: Not Currently    Alcohol/week: 3.0 standard drinks of alcohol    Types: 3 Cans of beer per week    Comment: has not drank sice 11/27/20   Drug use: Not Currently    Comment: REMOTE HISTORY, Quit in 2007   Sexual activity: Yes   Tobacco Counseling Counseling given: Yes  SDOH Screenings   Food Insecurity: No Food Insecurity (12/26/2024)  Housing: Low Risk (12/26/2024)  Transportation Needs: No Transportation Needs (12/26/2024)  Utilities: Not At Risk (12/26/2024)  Alcohol Screen: Low Risk (12/21/2023)  Depression (PHQ2-9): Low Risk (12/26/2024)  Financial Resource Strain: Low Risk (12/21/2023)  Physical Activity: Insufficiently Active (12/26/2024)  Social Connections: Socially Isolated (12/26/2024)  Stress: No Stress Concern Present (12/26/2024)  Tobacco Use: Medium Risk (12/26/2024)  Health Literacy: Adequate Health Literacy (12/26/2024)   See flowsheets for full screening details  Depression Screen PHQ 2 & 9 Depression Scale- Over the past 2 weeks, how often have you been bothered by any of the following problems? Little interest or pleasure in doing things: 0 Feeling down, depressed, or hopeless (PHQ Adolescent also includes...irritable): 0 PHQ-2 Total Score: 0 Trouble falling or staying asleep, or sleeping too much: 0 Feeling tired or having little energy: 0 Poor appetite or overeating (PHQ Adolescent also includes...weight loss): 0 Feeling bad about yourself - or that you are a failure or have let yourself or your family down: 0 Trouble concentrating on things, such as reading the newspaper or watching television (PHQ Adolescent also includes...like school work): 0 Moving or speaking so slowly that other people could have noticed. Or the opposite - being so fidgety or restless that you have been moving around a lot more than usual: 0 Thoughts that you would be  better off dead, or of hurting yourself in some way: 0 PHQ-9 Total Score: 0 If you checked off any problems, how difficult have these problems made it for you to do your work, take care of things at home, or get along with other people?: Not difficult at all  Depression Treatment Depression Interventions/Treatment : EYV7-0 Score <4 Follow-up Not Indicated     Goals Addressed               This Visit's Progress     strengthen my legs where I can walk without a can (pt-stated)               Objective:    Today's Vitals   12/26/24 1005  BP: 123/72  Weight: 275 lb (124.7 kg)  Height: 6' (1.829 m)   Body mass index is 37.3 kg/m.  Hearing/Vision screen Hearing Screening - Comments:: Patient denies any hearing difficulties.   Vision Screening - Comments:: Patient does not have an eye doctor. A list of eye doctors has been provided to the patient.  Immunizations and Health Maintenance Health Maintenance  Topic Date Due   Zoster Vaccines- Shingrix (1 of 2) Never done   COVID-19 Vaccine (3 - 2025-26 season) 07/31/2024   Lung Cancer Screening  10/07/2024   DTaP/Tdap/Td (2 - Tdap) 11/30/2024   Medicare Annual Wellness (AWV)  12/20/2024   Influenza Vaccine  02/27/2025 (Originally 06/30/2024)   Colonoscopy  10/28/2025   Pneumococcal Vaccine: 50+ Years  Completed   HPV VACCINES (No Doses Required) Completed   Hepatitis C Screening  Completed   HIV Screening  Completed   Hepatitis B Vaccines 19-59 Average Risk  Aged Out   Meningococcal B Vaccine  Aged Out        Assessment/Plan:  This is a routine wellness examination for Matthew Wise.  Patient Care Team: Tobie Suzzane POUR, MD as PCP - General (Internal Medicine) Vivian Greig NOVAK, PTA as Physical Therapy Assistant (Physical Therapy) Cindie Carlin POUR, DO as Consulting Physician (Internal Medicine) Rachele Gaynell RAMAN, MD as Referring Physician (Nephrology)  I have personally reviewed and noted the following in the patients  chart:   Medical and social history Use of alcohol, tobacco or illicit drugs  Current medications and supplements including opioid prescriptions. Functional ability and status Nutritional status Physical activity Advanced directives List of other physicians Hospitalizations, surgeries, and ER visits in previous 12 months Vitals Screenings to include cognitive, depression, and falls Referrals and appointments  Orders Placed This Encounter  Procedures   Ambulatory Referral for Lung Cancer Scre    Referral Priority:   Routine    Referral Type:   Consultation    Referral Reason:   Specialty Services Required    Number of Visits Requested:   1   In addition, I have reviewed and discussed with patient certain preventive protocols, quality metrics, and best practice recommendations. A written personalized care plan for preventive services as well as general preventive health recommendations were provided to patient.   Jermar Colter, CMA   12/26/2024   Return December 28, 2025, at 8:00 am, for In office Medicare Well Visit w  Wellness Nurse.  After Visit Summary: (Mail) Due to this being a telephonic visit, the after visit summary with patients personalized plan was offered to patient via mail    "

## 2024-12-26 NOTE — Patient Instructions (Signed)
 Matthew Wise,  Thank you for taking the time for your Medicare Wellness Visit. I appreciate your continued commitment to your health goals. Please review the care plan we discussed, and feel free to reach out if I can assist you further.  Please note that Annual Wellness Visits do not include a physical exam. Some assessments may be limited, especially if the visit was conducted virtually. If needed, we may recommend an in-person follow-up with your provider.  Ongoing Care Seeing your primary care provider every 3 to 6 months helps us  monitor your health and provide consistent, personalized care.   Aim for 30 minutes of exercise or brisk walking, 6-8 glasses of water , and 5 servings of fruits and vegetables each day.  Referrals If a referral was made during today's visit and you haven't received any updates within two weeks, please contact the referred provider directly to check on the status.  Lung Cancer Screening-[place] Office [address]-First Floor Medical Building directly across from [facility] Phone Number:[phone]  Recommended Screenings:  Health Maintenance  Topic Date Due   Zoster (Shingles) Vaccine (1 of 2) Never done   COVID-19 Vaccine (3 - 2025-26 season) 07/31/2024   Screening for Lung Cancer  10/07/2024   DTaP/Tdap/Td vaccine (2 - Tdap) 11/30/2024   Medicare Annual Wellness Visit  12/20/2024   Flu Shot  02/27/2025*   Colon Cancer Screening  10/28/2025   Pneumococcal Vaccine for age over 36  Completed   HPV Vaccine (No Doses Required) Completed   Hepatitis C Screening  Completed   HIV Screening  Completed   Hepatitis B Vaccine  Aged Out   Meningitis B Vaccine  Aged Out  *Topic was postponed. The date shown is not the original due date.       12/26/2024   10:11 AM  Advanced Directives  Does Patient Have a Medical Advance Directive? No  Would patient like information on creating a medical advance directive? No - Patient declined    Vision: Annual  vision screenings are recommended for early detection of glaucoma, cataracts, and diabetic retinopathy. These exams can also reveal signs of chronic conditions such as diabetes and high blood pressure.  Dental: Annual dental screenings help detect early signs of oral cancer, gum disease, and other conditions linked to overall health, including heart disease and diabetes.  Please see the attached documents for additional preventive care recommendations.

## 2024-12-27 ENCOUNTER — Inpatient Hospital Stay (HOSPITAL_COMMUNITY): Admission: EM | Admit: 2024-12-27 | Discharge: 2025-01-03 | DRG: 101 | Disposition: A

## 2024-12-27 ENCOUNTER — Emergency Department (HOSPITAL_COMMUNITY)

## 2024-12-27 ENCOUNTER — Encounter (HOSPITAL_COMMUNITY): Payer: Self-pay

## 2024-12-27 ENCOUNTER — Other Ambulatory Visit: Payer: Self-pay

## 2024-12-27 DIAGNOSIS — Z6838 Body mass index (BMI) 38.0-38.9, adult: Secondary | ICD-10-CM

## 2024-12-27 DIAGNOSIS — Z823 Family history of stroke: Secondary | ICD-10-CM

## 2024-12-27 DIAGNOSIS — N1831 Chronic kidney disease, stage 3a: Secondary | ICD-10-CM | POA: Diagnosis present

## 2024-12-27 DIAGNOSIS — E785 Hyperlipidemia, unspecified: Secondary | ICD-10-CM | POA: Diagnosis present

## 2024-12-27 DIAGNOSIS — Z8249 Family history of ischemic heart disease and other diseases of the circulatory system: Secondary | ICD-10-CM

## 2024-12-27 DIAGNOSIS — Z86718 Personal history of other venous thrombosis and embolism: Secondary | ICD-10-CM

## 2024-12-27 DIAGNOSIS — I69354 Hemiplegia and hemiparesis following cerebral infarction affecting left non-dominant side: Secondary | ICD-10-CM

## 2024-12-27 DIAGNOSIS — R441 Visual hallucinations: Secondary | ICD-10-CM | POA: Diagnosis not present

## 2024-12-27 DIAGNOSIS — Z833 Family history of diabetes mellitus: Secondary | ICD-10-CM

## 2024-12-27 DIAGNOSIS — Z87891 Personal history of nicotine dependence: Secondary | ICD-10-CM

## 2024-12-27 DIAGNOSIS — E876 Hypokalemia: Secondary | ICD-10-CM | POA: Diagnosis present

## 2024-12-27 DIAGNOSIS — E039 Hypothyroidism, unspecified: Secondary | ICD-10-CM | POA: Diagnosis present

## 2024-12-27 DIAGNOSIS — Z86711 Personal history of pulmonary embolism: Secondary | ICD-10-CM

## 2024-12-27 DIAGNOSIS — Z808 Family history of malignant neoplasm of other organs or systems: Secondary | ICD-10-CM

## 2024-12-27 DIAGNOSIS — Z8261 Family history of arthritis: Secondary | ICD-10-CM

## 2024-12-27 DIAGNOSIS — N401 Enlarged prostate with lower urinary tract symptoms: Secondary | ICD-10-CM | POA: Diagnosis present

## 2024-12-27 DIAGNOSIS — Z7901 Long term (current) use of anticoagulants: Secondary | ICD-10-CM

## 2024-12-27 DIAGNOSIS — E66812 Obesity, class 2: Secondary | ICD-10-CM | POA: Diagnosis present

## 2024-12-27 DIAGNOSIS — Z825 Family history of asthma and other chronic lower respiratory diseases: Secondary | ICD-10-CM

## 2024-12-27 DIAGNOSIS — Z79899 Other long term (current) drug therapy: Secondary | ICD-10-CM

## 2024-12-27 DIAGNOSIS — K219 Gastro-esophageal reflux disease without esophagitis: Secondary | ICD-10-CM | POA: Diagnosis present

## 2024-12-27 DIAGNOSIS — I129 Hypertensive chronic kidney disease with stage 1 through stage 4 chronic kidney disease, or unspecified chronic kidney disease: Secondary | ICD-10-CM | POA: Diagnosis present

## 2024-12-27 DIAGNOSIS — R7303 Prediabetes: Secondary | ICD-10-CM | POA: Diagnosis present

## 2024-12-27 DIAGNOSIS — G40901 Epilepsy, unspecified, not intractable, with status epilepticus: Principal | ICD-10-CM | POA: Diagnosis present

## 2024-12-27 DIAGNOSIS — Z79890 Hormone replacement therapy (postmenopausal): Secondary | ICD-10-CM

## 2024-12-27 DIAGNOSIS — R338 Other retention of urine: Secondary | ICD-10-CM | POA: Diagnosis present

## 2024-12-27 DIAGNOSIS — R569 Unspecified convulsions: Principal | ICD-10-CM

## 2024-12-27 LAB — CBC
HCT: 40.7 % (ref 39.0–52.0)
Hemoglobin: 13.4 g/dL (ref 13.0–17.0)
MCH: 27.3 pg (ref 26.0–34.0)
MCHC: 32.9 g/dL (ref 30.0–36.0)
MCV: 83.1 fL (ref 80.0–100.0)
Platelets: 179 10*3/uL (ref 150–400)
RBC: 4.9 MIL/uL (ref 4.22–5.81)
RDW: 13.8 % (ref 11.5–15.5)
WBC: 6.2 10*3/uL (ref 4.0–10.5)
nRBC: 0 % (ref 0.0–0.2)

## 2024-12-27 LAB — I-STAT CHEM 8, ED
BUN: 8 mg/dL (ref 8–23)
Calcium, Ion: 1.21 mmol/L (ref 1.15–1.40)
Chloride: 103 mmol/L (ref 98–111)
Creatinine, Ser: 1.5 mg/dL — ABNORMAL HIGH (ref 0.61–1.24)
Glucose, Bld: 142 mg/dL — ABNORMAL HIGH (ref 70–99)
HCT: 42 % (ref 39.0–52.0)
Hemoglobin: 14.3 g/dL (ref 13.0–17.0)
Potassium: 3.4 mmol/L — ABNORMAL LOW (ref 3.5–5.1)
Sodium: 142 mmol/L (ref 135–145)
TCO2: 26 mmol/L (ref 22–32)

## 2024-12-27 LAB — COMPREHENSIVE METABOLIC PANEL WITH GFR
ALT: 14 U/L (ref 0–44)
AST: 23 U/L (ref 15–41)
Albumin: 4.2 g/dL (ref 3.5–5.0)
Alkaline Phosphatase: 151 U/L — ABNORMAL HIGH (ref 38–126)
Anion gap: 14 (ref 5–15)
BUN: 9 mg/dL (ref 8–23)
CO2: 23 mmol/L (ref 22–32)
Calcium: 8.9 mg/dL (ref 8.9–10.3)
Chloride: 104 mmol/L (ref 98–111)
Creatinine, Ser: 1.33 mg/dL — ABNORMAL HIGH (ref 0.61–1.24)
GFR, Estimated: >60 mL/min
Glucose, Bld: 142 mg/dL — ABNORMAL HIGH (ref 70–99)
Potassium: 3.4 mmol/L — ABNORMAL LOW (ref 3.5–5.1)
Sodium: 141 mmol/L (ref 135–145)
Total Bilirubin: 0.5 mg/dL (ref 0.0–1.2)
Total Protein: 7.9 g/dL (ref 6.5–8.1)

## 2024-12-27 LAB — APTT: aPTT: 46 s — ABNORMAL HIGH (ref 24–36)

## 2024-12-27 LAB — DIFFERENTIAL
Abs Immature Granulocytes: 0.01 10*3/uL (ref 0.00–0.07)
Basophils Absolute: 0 10*3/uL (ref 0.0–0.1)
Basophils Relative: 1 %
Eosinophils Absolute: 0.1 10*3/uL (ref 0.0–0.5)
Eosinophils Relative: 1 %
Immature Granulocytes: 0 %
Lymphocytes Relative: 35 %
Lymphs Abs: 2.1 10*3/uL (ref 0.7–4.0)
Monocytes Absolute: 0.6 10*3/uL (ref 0.1–1.0)
Monocytes Relative: 10 %
Neutro Abs: 3.3 10*3/uL (ref 1.7–7.7)
Neutrophils Relative %: 53 %

## 2024-12-27 LAB — CBG MONITORING, ED: Glucose-Capillary: 138 mg/dL — ABNORMAL HIGH (ref 70–99)

## 2024-12-27 LAB — ETHANOL: Alcohol, Ethyl (B): <15 mg/dL

## 2024-12-27 LAB — PROTIME-INR
INR: 1.3 — ABNORMAL HIGH (ref 0.8–1.2)
Prothrombin Time: 16.4 s — ABNORMAL HIGH (ref 11.4–15.2)

## 2024-12-27 MED ORDER — LORAZEPAM 2 MG/ML IJ SOLN
INTRAMUSCULAR | Status: AC
  Administered 2024-12-27: 2 mg via INTRAVENOUS
  Filled 2024-12-27: qty 1

## 2024-12-27 MED ORDER — LEVETIRACETAM (KEPPRA) 500 MG/5 ML ADULT IV PUSH
4500.0000 mg | Freq: Once | INTRAVENOUS | Status: AC
  Administered 2024-12-27: 4500 mg via INTRAVENOUS
  Filled 2024-12-27: qty 45

## 2024-12-27 MED ORDER — LORAZEPAM 2 MG/ML IJ SOLN
INTRAMUSCULAR | Status: AC
  Filled 2024-12-27: qty 2

## 2024-12-27 MED ORDER — LEVETIRACETAM (KEPPRA) 500 MG/5 ML ADULT IV PUSH: 4500.0000 mg | Freq: Once | INTRAVENOUS | Status: DC

## 2024-12-27 MED ORDER — LABETALOL HCL 5 MG/ML IV SOLN
10.0000 mg | Freq: Once | INTRAVENOUS | Status: DC
  Filled 2024-12-27: qty 1

## 2024-12-27 MED ORDER — LORAZEPAM 2 MG/ML IJ SOLN: 2.0000 mg | Freq: Once | INTRAMUSCULAR | Status: AC

## 2024-12-27 MED ORDER — IOHEXOL 350 MG/ML SOLN
100.0000 mL | Freq: Once | INTRAVENOUS | Status: AC | PRN
  Administered 2024-12-27: 100 mL via INTRAVENOUS

## 2024-12-27 NOTE — ED Triage Notes (Signed)
 Rcems from home. Ems was called out by family for possible stroke. Patient has a history of CVA that has left his left side weak and uses a walker. However around an hour pta patient started having a headache and is unable to move that left side.  Denied any falls  Is on eliquis .  code stroke called out in field at 2255

## 2024-12-27 NOTE — ED Provider Notes (Signed)
 " [facility] Emergency Department Provider Note MRN:  [id number]  Arrival date & time: 12/27/24     Chief Complaint   Stroke History of Present Illness   Matthew Wise is a 64 y.o. year-old male with a history of stroke, CKD presenting to the ED with chief complaint of stroke.  Code stroke called in the field with EMS.  At about 10 PM patient began experiencing worsening left-sided numbness and weakness.  Has some mild residual weakness from a prior stroke on the same side.  Now is barely able to move the left side.  Left arm and leg also feel numb.  Family concerned about some speech disturbance as well.  Review of Systems  A thorough review of systems was obtained and all systems are negative except as noted in the HPI and PMH.   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 two simple tubular  adenomas removed. patient was advised to go to [facility] for colonoscopy but he did not keep appt.   IR CT HEAD LTD  02/02/2021   IR PERCUTANEOUS ART THROMBECTOMY/INFUSION INTRACRANIAL INC DIAG ANGIO  02/02/2021   RADIOLOGY WITH ANESTHESIA N/A 02/02/2021   Procedure: IR WITH ANESTHESIA;  Surgeon: Dolphus Carrion, MD;  Location: [facility] OR;  Service: Radiology;  Laterality: N/A;   TEE WITHOUT CARDIOVERSION N/A 09/10/2020   Procedure: TRANSESOPHAGEAL ECHOCARDIOGRAM (TEE) WITH PROPOFOL ;  Surgeon: Debera Jayson MATSU, MD;  Location: [facility] ORS;  Service: Cardiovascular;  Laterality: N/A;   XI ROBOTIC LAPAROSCOPIC ASSISTED APPENDECTOMY N/A 10/09/2023   Procedure: XI ROBOTIC LAPAROSCOPIC ASSISTED APPENDECTOMY;  Surgeon: Evonnie Dorothyann LABOR, DO;  Location: [facility] ORS;  Service: General;  Laterality: N/A;    Family History  Problem Relation Age of Onset   COPD Father    Stroke Brother    Pulmonary embolism Sister    Throat cancer Brother    Arthritis Mother    Diabetes Mother    Stroke Mother    Hypertension Brother    Diabetes Brother    Arthritis Brother    Diabetes Sister    Colon cancer Neg Hx     Social History   Socioeconomic History   Marital status: Divorced    Spouse name: Not on file   Number of children: 2   Years of education: 12   Highest education level: Not on file  Occupational History   Occupation: disability  Comment: DVT  Tobacco Use   Smoking status: Former    Current packs/day: 1.00    Average packs/day: 1 pack/day for 41.1 years (41.1 ttl pk-yrs)    Types: Cigarettes    Start date: 12/01/1983   Smokeless tobacco: Never  Vaping Use   Vaping status: Never Used  Substance and Sexual Activity   Alcohol use: Not Currently    Alcohol/week: 3.0 standard drinks of alcohol    Types: 3 Cans of beer per week    Comment: has not drank sice 11/27/20   Drug use: Not Currently    Comment: REMOTE HISTORY, Quit in 2007   Sexual activity: Yes  Other Topics Concern   Not on file   Social History Narrative   Lives with sister   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   Right Handed    Drinks caffeine on occassion   Social Drivers of Health   Tobacco Use: Medium Risk (12/27/2024)   Patient History    Smoking Tobacco Use: Former    Smokeless Tobacco Use: Never    Passive Exposure: Not on file  Financial Resource Strain: Low Risk (12/21/2023)   Overall Financial Resource Strain (CARDIA)    Difficulty of Paying Living Expenses: Not hard at all  Food Insecurity: No Food Insecurity (12/26/2024)   Epic    Worried About Radiation Protection Practitioner of Food in the Last Year: Never true    Ran Out of Food in the Last Year: Never true  Transportation Needs: No Transportation Needs (12/26/2024)   Epic    Lack of Transportation (Medical): No    Lack of Transportation (Non-Medical): No  Physical Activity: Insufficiently Active (12/26/2024)   Exercise Vital Sign    Days of Exercise per Week: 1 day    Minutes of Exercise per Session: 20 min  Stress: No Stress Concern Present (12/26/2024)   [employer] of Occupational Health - Occupational Stress Questionnaire    Feeling of Stress: Not at all  Social Connections: Socially Isolated (12/26/2024)   Social Connection and Isolation Panel    Frequency of Communication with Friends and Family: More than three times a week    Frequency of Social Gatherings with Friends and Family: Never    Attends Religious Services: Never    Database Administrator or Organizations: No    Attends Banker Meetings: Never    Marital Status: Divorced  Catering Manager Violence: Not At Risk (12/26/2024)   Epic    Fear of Current or Ex-Partner: No    Emotionally Abused: No    Physically Abused: No    Sexually Abused: No  Depression (PHQ2-9): Low Risk (12/26/2024)   Depression (PHQ2-9)    PHQ-2 Score: 0  Alcohol Screen: Low Risk (12/21/2023)   Alcohol Screen    Last Alcohol Screening Score (AUDIT): 0  Housing: Low Risk (12/26/2024)   Epic     Unable to Pay for Housing in the Last Year: No    Number of Times Moved in the Last Year: 0    Homeless in the Last Year: No  Utilities: Not At Risk (12/26/2024)   Epic    Threatened with loss of utilities: No  Health Literacy: Adequate Health Literacy (12/26/2024)   B1300 Health Literacy    Frequency of need for help with medical instructions: Never     Physical Exam   Vitals:   12/27/24 2345 12/27/24 2346  BP: (!) 164/89   Pulse:  87  Resp: 13 11  SpO2:  98%    CONSTITUTIONAL: Well-appearing, NAD NEURO/PSYCH:  Alert and oriented x 3, significant decrease strength and sensation to left arm, left leg EYES:  eyes equal and reactive ENT/NECK:  no LAD, no JVD CARDIO: Regular rate, well-perfused, normal S1 and S2 PULM:  CTAB no wheezing or rhonchi GI/GU:  non-distended, non-tender MSK/SPINE:  No gross deformities, no edema SKIN:  no rash, atraumatic   *Additional and/or pertinent findings included in MDM below  Diagnostic and Interventional Summary    EKG Interpretation Date/Time:  Wednesday December 27 2024 23:28:52 EST Ventricular Rate:  106 PR Interval:  153 QRS Duration:  109 QT Interval:  334 QTC Calculation: 444 R Axis:   28  Text Interpretation: Sinus tachycardia Multiform ventricular premature complexes Confirmed by Theadore Sharper [phone]) on 12/27/2024 11:56:48 PM       Labs Reviewed  MOZELL - Abnormal; Notable for the following components:      Result Value   Prothrombin Time 16.4 (*)    INR 1.3 (*)    All other components within normal limits  APTT - Abnormal; Notable for the following components:   aPTT 46 (*)    All other components within normal limits  COMPREHENSIVE METABOLIC PANEL WITH GFR - Abnormal; Notable for the following components:   Potassium 3.4 (*)    Glucose, Bld 142 (*)    Creatinine, Ser 1.33 (*)    Alkaline Phosphatase 151 (*)    All other components within normal limits  I-STAT CHEM 8, ED - Abnormal; Notable for the following  components:   Potassium 3.4 (*)    Creatinine, Ser 1.50 (*)    Glucose, Bld 142 (*)    All other components within normal limits  CBG MONITORING, ED - Abnormal; Notable for the following components:   Glucose-Capillary 138 (*)    All other components within normal limits  CBC  DIFFERENTIAL  ETHANOL  URINE DRUG SCREEN    CT ANGIO HEAD NECK W WO CM (CODE STROKE)  Final Result    CT HEAD CODE STROKE WO CONTRAST (LKW 0-4.5h, LVO 0-24h)  Final Result      Medications  LORazepam  (ATIVAN ) 2 MG/ML injection (has no administration in time range)  labetalol  (NORMODYNE ) injection 10 mg (has no administration in time range)  iohexol  (OMNIPAQUE ) 350 MG/ML injection 100 mL (100 mLs Intravenous Contrast Given 12/27/24 2320)  LORazepam  (ATIVAN ) injection 2 mg (2 mg Intravenous Given 12/27/24 2328)  levETIRAcetam  (KEPPRA ) undiluted injection 4,500 mg (4,500 mg Intravenous Given 12/27/24 2332)     Procedures  /  Critical Care .Critical Care  Performed by: Theadore Sharper HERO, MD Authorized by: Theadore Sharper HERO, MD   Critical care provider statement:    Critical care time (minutes):  45   Critical care was necessary to treat or prevent imminent or life-threatening deterioration of the following conditions:  CNS failure or compromise   Critical care was time spent personally by me on the following activities:  Development of treatment plan with patient or surrogate, discussions with consultants, evaluation of patient's response to treatment, examination of patient, ordering and review of laboratory studies, ordering and review of radiographic studies, ordering and performing treatments and interventions, pulse oximetry, re-evaluation of patient's condition and review of old charts   ED Course and Medical Decision Making  Initial Impression and Ddx Concern for acute ischemic stroke versus hemorrhagic stroke.  Patient is on blood thinners.  Code stroke protocol initiated, awaiting teleneurology  recommendations, CT imaging results.  Past medical/surgical history  that increases complexity of ED encounter: History of stroke  Interpretation of Diagnostics I personally reviewed the EKG and my interpretation is as follows: Sinus rhythm, PVCs  No significant blood count or electrolyte disturbance.  Patient Reassessment and Ultimate Disposition/Management     CT imaging is without acute findings, evidence of large remote infarct.  Patient exhibiting seizure activity here in the emergency department during the teleneurology evaluation.  Given 2 mg Ativan  and loaded with Keppra .  Seems to be improved from a seizure perspective.  Symptoms could be explained by seizure, Todd's paralysis.  Neurology recommending admission, MRI, seizure precautions excetra.  Will admit to medicine.  Neurology also recommending some blood pressure management, not necessarily permissive hypertension.  Goal is keeping blood pressure 180 or less per teleneurologist specialist.  Patient management required discussion with the following services or consulting groups:  Hospitalist Service and Neurology  Complexity of Problems Addressed Acute illness or injury that poses threat of life of bodily function  Additional Data Reviewed and Analyzed Further history obtained from: EMS on arrival  Additional Factors Impacting ED Encounter Risk Consideration of hospitalization  Ozell HERO. Theadore, MD [facility] Emergency [facility] [email or web address]  Final Clinical Impressions(s) / ED Diagnoses     ICD-10-CM   1. Seizure (HCC)  R56.9       ED Discharge Orders     None        Discharge Instructions Discussed with and Provided to Patient:   Discharge Instructions   None      Theadore Ozell HERO, MD 12/27/24 2358  "

## 2024-12-27 NOTE — ED Notes (Signed)
 CODE STROKE PAGED @ 2255 UPON EMS ENCODE IN THE FIELD

## 2024-12-27 NOTE — Consult Note (Signed)
 [facility] Consult Services   Patient Name:   Matthew Wise, Matthew Wise Date of Birth:   12/04/1960 Identification Number:   MRN - [id number] Date of Service:   12/27/2024 23:01:03  Diagnosis:       G40.89 - Other seizures  Impression:      This is a 64 year old M with history of CVA (large right MCA), seizures who presents to [facility]- [address], [place] for complaints of Seizures.    This patient presents to the emergency department after emergency medical services were called by family member due to concerns that he appeared twitchy and less responsive than his baseline. Last known well unclear to me at this time. While en route to the hospital via EMS, the patient developed left gaze deviation accompanied by full-body twitching movements.    During my examination in the emergency department, the patient experienced another witnessed clinical seizure characterized by left gaze deviation, unresponsiveness, and sustained full-body low-amplitude jerking movements. This episode lasted approximately five minutes in duration and terminated following administration of 2 mg of intravenous lorazepam . 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 Following resolution of the ictal activity, the patient's eyes returned to midline position.    The patient has a prior history of at least one past seizure and he takes Keppra  500 mg BID at home. His past medical history is notable for hypertension and a prior cerebrovascular accident. Computed tomography of the head performed during this evaluation demonstrates an old, large infarct involving the right middle cerebral artery territory.    I spoke to his emergency contact Zelda Leonce on the phone. She was not the one who called EMS - she reports a woman that lives with patient called EMS, but her  number is not available. Regarding his baseline functional status, Zelda reports the patient ambulates with the assistance of a cane short distances some times, but still needs assisatnce with many daily tasks like cooking/cleaning/bathing.    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 Indeed, this is also consistent with his known right hemisphere chronic stroke as the seizure onset zone. As such, per status epilepticus protocol he was bolused with 2 mg IV ativan  and 4500 mg IV keppra  (60 mg/kg to max 4500). He is not a tPA/Tnk candidate due to DOAC use with a PT>15 and aPTT>40 (absolute contraindication). Recs below.  Recommendations:        Telemetry Floor       Neuro Checks q2h until stable then q4h       Bedside Swallow Eval       DVT Prophylaxis       IV Fluids, Normal Saline       Head of Bed 30 Degrees       Euglycemia and Avoid Hyperthermia (PRN Acetaminophen ). Systolic BP cap <180        Already loaded with 4500 mg IV keppra  (Status dosing : 60 mg/kg to max 4500mg ). Now proceed with Levetiracetam  at 1000 mg IV BID , next dose 0900        PRN ativan  1-2 mg IV for consvulsions 1-3 minutes        If repeat seizures despite the levetiracetam  load, load lacosamide (Vimpat) 100 mg IV once then 50  mg IV BID thereafter (PRIOR to administering patient must have an EKG from this hospital stay with PR interval in normal range).         MRI brain with and without contrast        Continue home Pradaxa     ------------------------------------------------------------------------------  Advanced Imaging: CTA Head and Neck Completed.  LVO:No  Patient is not a candidate for NIR   Metrics: Last Known Well: Unknown Arrival Time: 12/27/2024 22:58:00 Activation Time: 12/27/2024 23:01:03 Initial  Response Time: 12/27/2024 23:03:38 Symptoms: Seizures. Initial patient interaction: 12/27/2024 23:20:14 NIHSS Assessment Completed: 12/27/2024 23:26:20 Patient is not a candidate for Thrombolytic. Thrombolytic Medical Decision: 12/27/2024 23:26:20 Patient was not deemed candidate for Thrombolytic because of following reasons: Use of NOAC in last 48 hrs. . other diagnosis suspected see impression. Seizure at onset with postictal residual neurological impairments .  CT Head: I personally reviewed all the CT images that were available to me and it showed: Massive right hemisphere chronic infarct. No acute pathology  Primary Provider Notified of Diagnostic Impression and Management Plan on: 12/27/2024 23:30:00    ------------------------------------------------------------------------------  History of Present Illness: Patient is a 64 year old Male.  Patient was brought by EMS for symptoms of Seizures. This is a 64 year old M with history of CVA (large right MCA), seizures who [place] [facility] [address] [place].  This patient presents to the emergency department after emergency medical services were called by family member due to concerns that he appeared twitchy and less responsive than his baseline. Last known well unclear to me at this time. While en route to the hospital via EMS, the patient developed left gaze deviation accompanied by full-body twitching movements.  During my examination in the emergency department, the patient experienced another witnessed clinical seizure characterized by left gaze deviation, unresponsiveness, and sustained full-body low-amplitude jerking movements. This episode lasted approximately five minutes in duration and terminated following administration of 2 mg of intravenous lorazepam . 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 Following resolution of the ictal activity, the patient's eyes returned to midline position.  The patient has a prior history of at least one past seizure and he takes Keppra  500 mg BID at home. His past medical history is notable for hypertension and a prior cerebrovascular accident. Computed tomography of the head performed during this evaluation demonstrates an old, large infarct involving the right middle cerebral artery territory.  I spoke to his emergency contact Zelda Mace on the phone. She was not the one who called EMS - she reports a woman that lives with patient called EMS, but her number is not available. Regarding his baseline functional status, Zelda reports the patient ambulates with the assistance of a cane short distances some times, but still needs assisatnce with many daily tasks like cooking/cleaning/bathing.   Past Medical History: Other PMH:  CVA, seizures  Medications:  Anticoagulant use:  Yes Pradaxa  No Antiplatelet use Reviewed EMR for current medications  Allergies:  Reviewed  Social History: Unable To Obtain Due To Patient Status : Patient Is Confused  Family History:  Family History Cannot Be Obtained Because:Patient Is Confused  ROS : ROS Cannot Be Obtained Because:  Patient Is Confused  Past Surgical History: Past Surgical History Cannot Be Obtained Because: Patient Is Confused There Is No Surgical History Contributory To Todays Visit  NIHSS may not be reliable  due to: Frequently fluctuating exam amidst multiple seizures  Examination: BP(200/100), Pulse(75), 1A: Level of Consciousness - Alert; keenly responsive + 0 1B: Ask Month and Age - Could Not Answer Either Question Correctly + 2 1C: Blink Eyes & Squeeze Hands - Performs Both Tasks + 0 2: Test Horizontal Extraocular Movements - Forced Gaze Palsy: Cannot Be Overcome + 2 3: Test Visual Fields - No Visual Loss + 0 4: Test Facial Palsy (Use Grimace  if Obtunded) - Normal symmetry + 0 5A: Test Left Arm Motor Drift - No Effort Against Gravity + 3 5B: Test Right Arm Motor Drift - No Drift for 10 Seconds + 0 6A: Test Left Leg Motor Drift - No Effort Against Gravity + 3 6B: Test Right Leg Motor Drift - No Drift for 5 Seconds + 0 7: Test Limb Ataxia (FNF/Heel-Shin) - No Ataxia + 0 8: Test Sensation - Normal; No sensory loss + 0 9: Test Language/Aphasia - Normal; No aphasia + 0 10: Test Dysarthria - Normal + 0 11: Test Extinction/Inattention - No abnormality + 0  NIHSS Score: 10  NIHSS Free Text : Left gaze deviation during seizure, able to speak outside of the seizure  Pre-Morbid Modified Rankin Scale: 3 Points = Moderate disability; requiring some help, but able to walk without assistance  Spoke with : ED Doctor at bedside I reviewed the available imaging via Rapid and initiated discussion with the primary provider  This consult was conducted in real time using interactive audio and immunologist. Patient was informed of the technology being used for this visit and agreed to proceed. Patient located in hospital and provider located at home/office setting.   Patient is being evaluated for possible acute neurologic impairment and high probability of imminent or life-threatening deterioration. I spent total of 30 minutes providing care to this patient, including time for face to face visit via telemedicine, review of medical records, imaging studies and discussion of findings with providers, the patient and/or family.    Dr Elspeth Narrow   [facility] For Inpatient follow-up with [facility] physician please call [facility] at [phone]. As we are not an outpatient service for any post hospital discharge needs please contact the hospital for assistance. If you have any questions for the [facility] physicians or need to reconsult for clinical or diagnostic changes please contact us  via [facility] at  [phone].  Non-radiologist review of imaging performed to assist with emergent clinical decision-making. Remote physician workstations do not possess the same resolution, calibration, or diagnostic capabilities as hospital-based radiology reading stations, and formal radiologist read is necessary.   Signature : Elspeth Narrow

## 2024-12-27 NOTE — ED Notes (Signed)
 Sister Matthew Wise will person of contact. [phone]

## 2024-12-27 NOTE — ED Notes (Signed)
 Called edp to room due to seizure like activity. Patient twitching, not responding to verbal stimuli, eyes and head deviated to the left.

## 2024-12-28 ENCOUNTER — Inpatient Hospital Stay (HOSPITAL_COMMUNITY)

## 2024-12-28 ENCOUNTER — Encounter (HOSPITAL_COMMUNITY): Payer: Self-pay

## 2024-12-28 ENCOUNTER — Encounter (HOSPITAL_COMMUNITY): Payer: Self-pay | Admitting: Internal Medicine

## 2024-12-28 ENCOUNTER — Inpatient Hospital Stay (HOSPITAL_COMMUNITY)
Admit: 2024-12-28 | Discharge: 2024-12-28 | Disposition: A | Discharge status: Home / Self Care | Attending: Internal Medicine | Admitting: Internal Medicine

## 2024-12-28 DIAGNOSIS — I6389 Other cerebral infarction: Secondary | ICD-10-CM

## 2024-12-28 DIAGNOSIS — G40909 Epilepsy, unspecified, not intractable, without status epilepticus: Secondary | ICD-10-CM

## 2024-12-28 DIAGNOSIS — R569 Unspecified convulsions: Secondary | ICD-10-CM | POA: Diagnosis not present

## 2024-12-28 LAB — URINALYSIS, ROUTINE W REFLEX MICROSCOPIC
Bilirubin Urine: NEGATIVE
Glucose, UA: NEGATIVE mg/dL
Hgb urine dipstick: NEGATIVE
Ketones, ur: NEGATIVE mg/dL
Leukocytes,Ua: NEGATIVE
Nitrite: NEGATIVE
Protein, ur: NEGATIVE mg/dL
Specific Gravity, Urine: 1.02 (ref 1.005–1.030)
pH: 6 (ref 5.0–8.0)

## 2024-12-28 LAB — BASIC METABOLIC PANEL WITH GFR
Anion gap: 12 (ref 5–15)
BUN: 9 mg/dL (ref 8–23)
CO2: 28 mmol/L (ref 22–32)
Calcium: 9 mg/dL (ref 8.9–10.3)
Chloride: 102 mmol/L (ref 98–111)
Creatinine, Ser: 1.37 mg/dL — ABNORMAL HIGH (ref 0.61–1.24)
GFR, Estimated: 58 mL/min — ABNORMAL LOW
Glucose, Bld: 132 mg/dL — ABNORMAL HIGH (ref 70–99)
Potassium: 3.6 mmol/L (ref 3.5–5.1)
Sodium: 142 mmol/L (ref 135–145)

## 2024-12-28 LAB — MRSA NEXT GEN BY PCR, NASAL: MRSA by PCR Next Gen: NOT DETECTED

## 2024-12-28 LAB — ECHOCARDIOGRAM COMPLETE
AR max vel: 2.96 cm2
AV Area VTI: 3.31 cm2
AV Area mean vel: 3.52 cm2
AV Mean grad: 3 mmHg
AV Peak grad: 6.7 mmHg
Ao pk vel: 1.29 m/s
Area-P 1/2: 2.72 cm2
Calc EF: 61.7 %
Height: 72 in
MV VTI: 3.32 cm2
S' Lateral: 3.7 cm
Single Plane A2C EF: 64.1 %
Single Plane A4C EF: 59.6 %
Weight: 4518.55 [oz_av]

## 2024-12-28 LAB — URINE DRUG SCREEN
Amphetamines: NEGATIVE
Barbiturates: NEGATIVE
Benzodiazepines: NEGATIVE
Cocaine: NEGATIVE
Fentanyl: NEGATIVE
Methadone Scn, Ur: NEGATIVE
Opiates: NEGATIVE
Tetrahydrocannabinol: NEGATIVE

## 2024-12-28 LAB — MAGNESIUM: Magnesium: 2 mg/dL (ref 1.7–2.4)

## 2024-12-28 MED ORDER — LACTATED RINGERS IV SOLN: INTRAVENOUS | Status: AC

## 2024-12-28 MED ORDER — MELATONIN 3 MG PO TABS
6.0000 mg | ORAL_TABLET | Freq: Every evening | ORAL | Status: DC | PRN
  Administered 2024-12-28: 6 mg via ORAL
  Filled 2024-12-28: qty 2

## 2024-12-28 MED ORDER — PROCHLORPERAZINE EDISYLATE 10 MG/2ML IJ SOLN: 5.0000 mg | Freq: Four times a day (QID) | INTRAMUSCULAR | Status: DC | PRN

## 2024-12-28 MED ORDER — LEVETIRACETAM (KEPPRA) 500 MG/5 ML ADULT IV PUSH
1000.0000 mg | Freq: Two times a day (BID) | INTRAVENOUS | Status: DC
  Administered 2024-12-28 – 2025-01-01 (×10): 1000 mg via INTRAVENOUS
  Filled 2024-12-28 (×10): qty 10

## 2024-12-28 MED ORDER — GADOBUTROL 1 MMOL/ML IV SOLN
10.0000 mL | Freq: Once | INTRAVENOUS | Status: AC | PRN
  Administered 2024-12-28: 10 mL via INTRAVENOUS

## 2024-12-28 MED ORDER — ACETAMINOPHEN 325 MG PO TABS: 650.0000 mg | ORAL_TABLET | Freq: Four times a day (QID) | ORAL | Status: DC | PRN

## 2024-12-28 MED ORDER — PANTOPRAZOLE SODIUM 40 MG PO TBEC
40.0000 mg | DELAYED_RELEASE_TABLET | Freq: Every day | ORAL | Status: DC
  Administered 2024-12-28 – 2025-01-03 (×7): 40 mg via ORAL
  Filled 2024-12-28 (×7): qty 1

## 2024-12-28 MED ORDER — LABETALOL HCL 5 MG/ML IV SOLN: 10.0000 mg | INTRAVENOUS | Status: DC | PRN

## 2024-12-28 MED ORDER — STROKE: EARLY STAGES OF RECOVERY BOOK
Freq: Once | Status: AC
  Filled 2024-12-28: qty 1

## 2024-12-28 MED ORDER — LEVOTHYROXINE SODIUM 50 MCG PO TABS
50.0000 ug | ORAL_TABLET | Freq: Every day | ORAL | Status: DC
  Administered 2024-12-28 – 2025-01-03 (×7): 50 ug via ORAL
  Filled 2024-12-28 (×2): qty 1
  Filled 2024-12-28: qty 2
  Filled 2024-12-28 (×4): qty 1

## 2024-12-28 MED ORDER — DABIGATRAN ETEXILATE MESYLATE 150 MG PO CAPS
150.0000 mg | ORAL_CAPSULE | Freq: Two times a day (BID) | ORAL | Status: DC
  Administered 2024-12-28 – 2025-01-03 (×13): 150 mg via ORAL
  Filled 2024-12-28 (×16): qty 1

## 2024-12-28 MED ORDER — POLYETHYLENE GLYCOL 3350 17 G PO PACK: 17.0000 g | PACK | Freq: Every day | ORAL | Status: DC | PRN

## 2024-12-28 MED ORDER — CHLORHEXIDINE GLUCONATE CLOTH 2 % EX PADS
6.0000 | MEDICATED_PAD | Freq: Every day | CUTANEOUS | Status: DC
  Administered 2024-12-28 – 2025-01-01 (×5): 6 via TOPICAL

## 2024-12-28 MED ORDER — LORAZEPAM 2 MG/ML IJ SOLN: 1.0000 mg | INTRAMUSCULAR | Status: DC | PRN

## 2024-12-28 MED ORDER — POTASSIUM CHLORIDE CRYS ER 10 MEQ PO TBCR
10.0000 meq | EXTENDED_RELEASE_TABLET | Freq: Once | ORAL | Status: AC
  Administered 2024-12-28: 10 meq via ORAL
  Filled 2024-12-28: qty 1

## 2024-12-28 NOTE — Plan of Care (Signed)
 Courtesy visit: No billing-  Patient is seen and examined today morning in ICU.  He is being admitted for stroke workup and possible seizures.  Patient had prior large right MCA stroke with left-sided weakness.  States his left-sided weakness worsened along with numbness with twitching movements, left gaze deviation.  He denies any complaints today morning, no more episodes of seizures.  Plan: Will follow MRI brain, EEG.  Will discuss with neurology regarding further plan.  Continue IV Keppra  1 g twice daily.  Follow stroke workup, allow permissive hypertension.  PT OT evaluated him advised inpatient rehab.  He is able to tolerate diet well.  Further management as per clinical course.

## 2024-12-28 NOTE — Progress Notes (Signed)
 Inpatient Rehab Admissions Coordinator Note:  Per therapy recommendations patient was screened for CIR candidacy by [name], PT. At this time, pt does not appear to demonstrate medical necessity to support a CIR admission.  We will not pursue a rehab consult at this time.  Recommend other rehab venues to be pursued.  Please contact me with any questions.  [name],  [phone] 12/28/24 3:26 PM

## 2024-12-28 NOTE — Progress Notes (Signed)
 SLP Cancellation Note  Patient Details Name: Matthew Wise MRN: [id number] DOB: 20-Jun-1961   Cancelled treatment:       Reason Eval/Treat Not Completed: SLP screened, no needs identified, will sign off;nursing deferred BSE/SLE at this time; pt is at baseline and passed Yale swallow screen; He is on a regular/thin liquid diet currently and tolerating well.     [name],M.S.,CCC-SLP 12/28/2024, 1:31 PM

## 2024-12-28 NOTE — Consult Note (Addendum)
 I connected with  Matthew Wise on 12/28/24 by a video enabled telemedicine application and verified that I am speaking with the correct person using two identifiers.   I discussed the limitations of evaluation and management by telemedicine. The patient expressed understanding and agreed to proceed.  Location of patient: [facility] Location of physician: [facility]  Neurology Consultation Reason for Consult: Seizure Referring Physician: Dr. Concepcion Riser  CC: Seizure  History is obtained from: Patient, chart review  HPI: Matthew Wise is a 64 y.o. adult with past medical history of stroke, subsequent seizures who was brought in with breakthrough seizure-like episode.  Patient states he does not remember the seizure.  However per chart review yesterday evening around 10 PM patient was noted to have worsening left-sided weakness as well as slurred speech.  While in the ER at around 11:22 PM he was noted to have seizure described as twitching, not responding to verbal stimuli, eyes and head deviated to the left lasting for about 5 minutes and received Ativan  as well as Keppra  load.  Patient states he has been compliant with his home Keppra .  Denies any recent infection.   ROS: All other systems reviewed and negative except as noted in the HPI.   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 His smoking use included cigarettes. He started smoking about 41 years ago. He has a 41.1 pack-year smoking history. He has never used smokeless tobacco. He reports that he does not currently use alcohol after a past usage of about 3.0 standard drinks of alcohol per week. He reports that he does not currently use drugs.   Medications Prior to Admission  Medication Sig Dispense Refill Last Dose/Taking   amLODipine  (NORVASC ) 5 MG tablet Take 1 tablet (5 mg total) by mouth daily. (Patient not taking: Reported on 12/26/2024) 90 tablet 3    aspirin  EC 81 MG EC tablet Take 1 tablet (81 mg total) by mouth daily. Swallow whole. 30 tablet 11    atorvastatin  (LIPITOR) 40 MG tablet Take 1  tablet (40 mg total) by mouth daily. 90 tablet 3    candesartan (ATACAND) 4 MG tablet Take 4 mg by mouth daily.      levETIRAcetam  (KEPPRA ) 500 MG tablet Take 1 tablet by mouth twice daily 180 tablet 1    levothyroxine  (SYNTHROID ) 50 MCG tablet Take 1 tablet (50 mcg total) by mouth daily before breakfast. 90 tablet 1    lisinopril  (ZESTRIL ) 2.5 MG tablet Take 1 tablet by mouth once daily 90 tablet 0    Multiple Vitamin (MULTIVITAMIN) tablet Take 1 tablet by mouth daily.      pantoprazole  (PROTONIX )  40 MG tablet Take 1 tablet (40 mg total) by mouth daily. 90 tablet 3    polyethylene glycol (MIRALAX  / GLYCOLAX ) 17 g packet Take 17 g by mouth daily.      potassium chloride  (KLOR-CON ) 20 MEQ packet Take 20 mEq by mouth daily. X 7 days      PRADAXA  150 MG CAPS capsule Take 1 capsule (150 mg total) by mouth every 12 (twelve) hours. 180 capsule 3    senna-docusate (SENOKOT-S) 8.6-50 MG tablet Take 1 tablet by mouth at bedtime.      tamsulosin  (FLOMAX ) 0.4 MG CAPS capsule TAKE 1 CAPSULE BY MOUTH ONCE DAILY AFTER SUPPER 90 capsule 0       Exam: Current vital signs: BP (!) 158/75   Pulse (!) 103   Temp 97.7 F (36.5 C) (Oral)   Resp 13   Ht 6' (1.829 m)   Wt 128.1 kg   SpO2 (!) 88%   BMI 38.30 kg/m  Vital signs in last 24 hours: Temp:  [97.7 F (36.5 C)-99.7 F (37.6 C)] 97.7 F (36.5 C) (01/29 1140) Pulse Rate:  [67-151] 103 (01/29 0900) Resp:  [3-24] 13 (01/29 1100) BP: (141-210)/(75-127) 158/75 (01/29 1100) SpO2:  [88 %-100 %] 88 % (01/29 0900) Weight:  [128.1 kg] 128.1 kg (01/29 0300)   Physical Exam  Constitutional: Appears well-developed and well-nourished.  Psych: Affect appropriate to situation Neuro: AO x 3, no aphasia or dysarthria, cranial nerves grossly intact except left facial droop, 2/5 in left upper and left lower extremity with upper extremity contracture, antigravity send without drift in right upper and right lower extremity, reports numbness and left upper and lower extremity, FTN intact in right upper extremity  I have reviewed labs in epic and the results pertinent to this consultation are: CBC:  Recent Labs  Lab 12/27/24 2303 12/27/24 2307  WBC 6.2  --   NEUTROABS 3.3  --   HGB 13.4 14.3  HCT 40.7 42.0  MCV 83.1  --   PLT 179  --     Basic Metabolic Panel:  Lab Results  Component Value Date   NA 142 12/28/2024   K 3.6 12/28/2024   CO2 28 12/28/2024   GLUCOSE 132 (H) 12/28/2024   BUN 9 12/28/2024   CREATININE 1.37 (H) 12/28/2024   CALCIUM   9.0 12/28/2024   GFRNONAA 58 (L) 12/28/2024   GFRAA 48 (L) 04/10/2021   Lipid Panel:  Lab Results  Component Value Date   LDLCALC 51 07/17/2024   HgbA1c:  Lab Results  Component Value Date   HGBA1C 5.9 (H) 07/17/2024   Urine Drug Screen:     Component Value Date/Time   LABOPIA NEGATIVE 12/28/2024 0030   COCAINSCRNUR NEGATIVE 12/28/2024 0030   LABBENZ NEGATIVE 12/28/2024 0030   AMPHETMU NEGATIVE 12/28/2024 0030   THCU NEGATIVE 12/28/2024 0030   LABBARB NEGATIVE 12/28/2024 0030  Alcohol Level     Component Value Date/Time   [facility] <15 12/27/2024 2303     I have reviewed the images obtained:  CT head without contrast 12/27/2024: No acute intracranial abnormality. Aspects equals 10. Large remote right MCA distribution infarct. Underlying chronic microvascular ischemic disease.  CTA head and neck with and without contrast 12/27/2024: Negative CTA for large vessel occlusion or other emergent finding. Mild atheromatous change about the carotid bifurcations and carotid siphons without hemodynamically significant stenosis. Fetal type origin of the PCAs with overall diminutive vertebrobasilar system. Aortic Atherosclerosis (ICD10-I70.0).  MRI brain with and without contrast 12/28/2024: Signal abnormality and restricted diffusion within the right hippocampus, favor postictal changes. Recommend correlation with EEG findings. Acute infarct is an additional consideration. Large remote right MCA territory infarct. Small remote infarcts in the pons and cerebellum.      ASSESSMENT/PLAN: 63 year old male with history of prior stroke and subsequent seizures presented with breakthrough seizure, no clear etiology.  Epilepsy with breakthrough seizure - No clear etiology.  MRI brain reviewed, most likely postictal changes. -Will check UA and respiratory panel to look for any infectious causes.  Currently afebrile.  No neck stiffness, mental status appears to be back to baseline.  Therefore  does not need a lumbar puncture - On Keppra  500 mg twice daily at home.  Keppra  levels were not checked on arrival.  However patient does report compliance.  Please ask pharmacy to check if he has filled his prescriptions consistently - As no other clear etiology, will increase Keppra  to 1000 mg twice daily for now -Continue Pradaxa  -Goal blood pressure: Normotension - Continue seizure precautions - EEG ordered and pending.  If negative and patient remains at baseline, okay to discharge from neurology standpoint - Please continue to follow-up with [facility] - Discussed plan with patient at bedside -Discussed plan with Dr. Darci via secure chat  Addendum - EEG showed LPDs. Given mri changes and h/o subclinical sz, recommend transfer to [place] for LTM eeg. Hospitalist notified   .Seizure precautions: Per [place]  DMV statutes, patients with seizures are not allowed to drive until they have been seizure-free for six months and cleared by a physician    Use caution when using heavy equipment or power tools. Avoid working on ladders or at heights. Take showers instead of baths. Ensure the water  temperature is not too high on the home water  heater. Do not go swimming alone. Do not lock yourself in a room alone (i.e. bathroom). When caring for infants or small children, sit down when holding, feeding, or changing them to minimize risk of injury to the child in the event you have a seizure. Maintain good sleep hygiene. Avoid alcohol.    If patient has another seizure, call 911 and bring them back to the ED if: A.  The seizure lasts longer than 5 minutes.      B.  The patient doesn't wake shortly after the seizure or has new problems such as difficulty seeing, speaking or moving following the seizure C.  The patient was injured during the seizure D.  The patient has a temperature over 102 F (39C) E.  The patient vomited during the seizure and now is having trouble  breathing    During the Seizure   - First, ensure adequate ventilation and place patients on the floor on their left side  Loosen clothing around the neck and ensure the airway is patent. If the patient is clenching the teeth, do not force  the mouth open with any object as this can cause severe damage - Remove all items from the surrounding that can be hazardous. The patient may be oblivious to what's happening and may not even know what he or she is doing. If the patient is confused and wandering, either gently guide him/her away and block access to outside areas - Reassure the individual and be comforting - Call 911. In most cases, the seizure ends before EMS arrives. However, there are cases when seizures may last over 3 to 5 minutes. Or the individual may have developed breathing difficulties or severe injuries. If a pregnant patient or a person with diabetes develops a seizure, it is prudent to call an ambulance.     After the Seizure (Postictal Stage)   After a seizure, most patients experience confusion, fatigue, muscle pain and/or a headache. Thus, one should permit the individual to sleep. For the next few days, reassurance is essential. Being calm and helping reorient the person is also of importance.   Most seizures are painless and end spontaneously. Seizures are not harmful to others but can lead to complications such as stress on the lungs, brain and the heart. Individuals with prior lung problems may develop labored breathing and respiratory distress.      Thank you for allowing us  to participate in the care of this patient. If you have any further questions, please contact  me or neurohospitalist.    Arlin Krebs Epile[facility] neuro[facility]ist

## 2024-12-28 NOTE — Evaluation (Signed)
 Physical Therapy Evaluation Patient Details Name: Matthew Wise MRN: [id number] DOB: 03/27/1961 Today's Date: 12/28/2024  History of Present Illness  Matthew Wise is a 64 y.o. 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  Last known well was prior to 10 PM when he began to experience worsening left-sided weakness.  At baseline, the patient has left-sided weakness however he can still move his extremities.  This time, he could barely move his left side and his left arm and his left leg were also numb.  Additionally, reportedly, the patient had some twitching movement witnessed by family members and was less responsive than his baseline.     Upon arrival to the ER, the patient was seen by teleneurology for code stroke and had another episode of clinical seizure, characterized by left gaze deviation, unresponsiveness and sustained full body low-amplitude jerking movements.  It lasted approximately 5 minutes in duration and terminated following administration of 2 mg of IV Ativan .     Neurology recommended loading dose of IV Keppra  4.5 g once, then 1 g twice daily.  Admitted by [facility], hospitalist service.     At the time of this visit, the patient is alert and responding to questions appropriately.  He has a mild speech impediment.   Clinical Impression  Patient demonstrates slow labored movement for sitting up at bedside requiring increased time for scooting to EOB or laterally. Patient had difficulty completing sit to stand due to LLE weakness and requiring assistance for flexing left knee, very unsteady on feet and limited to a few steps at bedside due fatigue and increased left sided weakness. Patient put back to bed to be taken for MRI. Patient will benefit from continued skilled physical therapy in hospital and recommended venue below to increase strength, balance,  endurance for safe ADLs and gait.          If plan is discharge home, recommend the following: A lot of help with bathing/dressing/bathroom;A lot of help with walking and/or transfers;Help with stairs or ramp for entrance;Assist for transportation;Assistance with cooking/housework   Can travel by private vehicle        Equipment Recommendations    Recommendations for Other Services       Functional Status Assessment Patient has had a recent decline in their functional status and demonstrates the ability to make significant improvements in function in a reasonable and predictable amount of time.     Precautions / Restrictions Precautions Precautions: Fall Recall of Precautions/Restrictions: Intact Restrictions Weight Bearing Restrictions Per Provider Order: No      Mobility  Bed Mobility Overal bed mobility: Needs Assistance Bed Mobility: Supine to Sit     Supine to sit: Mod assist     General bed mobility comments: slow labored movement    Transfers Overall transfer level: Needs assistance Equipment used: Quad cane Transfers: Sit to/from Stand, Bed to chair/wheelchair/BSC Sit to Stand: Mod assist   Step pivot transfers: Mod assist       General transfer comment: had to assist patient for flexing left knee before completing sit to stand, unsteady labored movent    Ambulation/Gait Ambulation/Gait assistance: Mod assist Gait Distance (Feet): 5 Feet Assistive device: Quad cane Gait Pattern/deviations: Step-to pattern, Decreased step length - right, Decreased step length - left, Decreased stance time - left, Decreased stride length Gait velocity: slow     General Gait  Details: limited to a few slo wlabored steps at bedside using quad-cane wiht step-to pattern, limited mostly due to fatigue  Stairs            Wheelchair Mobility     Tilt Bed    Modified Rankin (Stroke Patients Only)       Balance Overall balance assessment: Needs  assistance Sitting-balance support: Feet supported, No upper extremity supported Sitting balance-Leahy Scale: Fair Sitting balance - Comments: fair/good seated at EOB   Standing balance support: Reliant on assistive device for balance, Single extremity supported, During functional activity Standing balance-Leahy Scale: Poor Standing balance comment: using wide based quad cane                             Pertinent Vitals/Pain Pain Assessment Pain Assessment: Faces Faces Pain Scale: Hurts a little bit Pain Location: LLE when fully flexing left knee Pain Descriptors / Indicators: Discomfort Pain Intervention(s): Limited activity within patient's tolerance, Monitored during session, Repositioned    Home Living Family/patient expects to be discharged to:: Private residence Living Arrangements: Spouse/significant other Available Help at Discharge: Family;Available PRN/intermittently Type of Home: House Home Access: Stairs to enter Entrance Stairs-Rails: Right;Left;Can reach both Entrance Stairs-Number of Steps: 3   Home Layout: One level Home Equipment: Agricultural Consultant (2 wheels);Shower seat;Cane - quad      Prior Function Prior Level of Function : Needs assist       Physical Assist : Mobility (physical);ADLs (physical) Mobility (physical): Bed mobility;Transfers;Gait;Stairs   Mobility Comments: Household ambulation using Quad-cane, Mod Independent transfers, bed mobility ADLs Comments: Assisted by family     Extremity/Trunk Assessment   Upper Extremity Assessment Upper Extremity Assessment: Defer to OT evaluation    Lower Extremity Assessment Lower Extremity Assessment: Generalized weakness;LLE deficits/detail;RLE deficits/detail RLE Deficits / Details: grossly -4/5 RLE Sensation: WNL RLE Coordination: WNL LLE Deficits / Details: grossly -3/5 except left ankle dorsiflexion 0/5 LLE Sensation: decreased light touch;decreased proprioception LLE Coordination:  decreased fine motor;decreased gross motor    Cervical / Trunk Assessment Cervical / Trunk Assessment: Normal  Communication   Communication Communication: No apparent difficulties    Cognition Arousal: Alert Behavior During Therapy: WFL for tasks assessed/performed                             Following commands: Intact       Cueing Cueing Techniques: Verbal cues, Tactile cues     General Comments      Exercises     Assessment/Plan    PT Assessment Patient needs continued PT services  PT Problem List Decreased strength;Decreased activity tolerance;Decreased balance;Decreased mobility       PT Treatment Interventions DME instruction;Gait training;Stair training;Functional mobility training;Therapeutic activities;Therapeutic exercise;Balance training;Patient/family education    PT Goals (Current goals can be found in the Care Plan section)  Acute Rehab PT Goals Patient Stated Goal: return home with family to assist PT Goal Formulation: With patient Time For Goal Achievement: 01/11/25 Potential to Achieve Goals: Good    Frequency Min 4X/week     Co-evaluation PT/OT/SLP Co-Evaluation/Treatment: Yes Reason for Co-Treatment: To address functional/ADL transfers   OT goals addressed during session: ADL's and self-care;Proper use of Adaptive equipment and DME       AM-PAC PT 6 Clicks Mobility  Outcome Measure Help needed turning from your back to your side while in a flat bed without using bedrails?: A Little Help needed  moving from lying on your back to sitting on the side of a flat bed without using bedrails?: A Lot Help needed moving to and from a bed to a chair (including a wheelchair)?: A Lot Help needed standing up from a chair using your arms (e.g., wheelchair or bedside chair)?: A Lot Help needed to walk in hospital room?: A Lot Help needed climbing 3-5 steps with a railing? : Total 6 Click Score: 12    End of Session Equipment Utilized  During Treatment: Gait belt Activity Tolerance: Patient tolerated treatment well;Patient limited by fatigue Patient left: in bed;with call bell/phone within reach Nurse Communication: Mobility status PT Visit Diagnosis: Unsteadiness on feet (R26.81);Other abnormalities of gait and mobility (R26.89);Muscle weakness (generalized) (M62.81);Ataxic gait (R26.0)    Time: 9164-9089 PT Time Calculation (min) (ACUTE ONLY): 35 min   Charges:   PT Evaluation $PT Eval Moderate Complexity: 1 Mod PT Treatments $Therapeutic Activity: 23-37 mins PT General Charges $$ ACUTE PT VISIT: 1 Visit         12:39 PM, 12/28/24 Lynwood Music, MPT Physical Therapist with [facility] [phone] office [phone] mobile phone
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
## 2024-12-28 NOTE — Procedures (Signed)
 Patient Name: Matthew Wise  MRN: [id number]  Epilepsy Attending: Arlin MALVA Krebs  Referring Physician/Provider: Shona Terry SAILOR, DO  Date: 12/28/2024 Duration: 22.25 mins  Patient history:  64 year old male with history of prior stroke and subsequent seizures presented with breakthrough seizure, no clear etiology. EEG to evaluate for seizure  Level of alertness: Awake  AEDs during EEG study: LEV  Technical aspects: This EEG study was done with scalp electrodes positioned according to the 10-20 International system of electrode placement. Electrical activity was reviewed with band pass filter of 1-70Hz , sensitivity of 7 uV/mm, display speed of 30mm/sec with a 60Hz  notched filter applied as appropriate. EEG data were recorded continuously and digitally stored.  Video monitoring was available and reviewed as appropriate.  Description: The posterior dominant rhythm consists of 9 Hz activity of moderate voltage (25-35 uV) seen predominantly in posterior head regions, symmetric and reactive to eye opening and eye closing. EEG showed continuous 3 to 6 Hz theta-delta slowing in right hemisphere. Lateralized periodic discharges were noted in right hemisphere at 0.5-1hz . Hyperventilation and photic stimulation were not performed.     This study was technically difficult due to significant myogenic artifact   ABNORMALITY - Lateralized periodic discharges, right hemisphere - Continuous slow, right hemisphere  IMPRESSION: This technically difficult study showed evidence of epileptogenicity and cortical dysfunction arising from right hemisphere likely secondary to underlying structural abnormality and increased risk of seizure recurrence. No seizures were seen throughout the recording.  Matthew Wise

## 2024-12-28 NOTE — H&P (Signed)
 " History and Physical  Matthew Wise FMW:[id number] DOB: 09/21/1961 DOA: 12/27/2024  Referring physician: Dr. Theadore, EDP  PCP: Tobie Suzzane POUR, MD  Outpatient Specialists: Neurology. Patient coming from: Home.  Chief Complaint: Code stroke with worsening left-sided weakness, and seizure-like activity.  HPI: Matthew Wise is a 64 y.o. 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  Last known well was prior to 10 PM when he began to experience worsening left-sided weakness.  At baseline, the patient has left-sided weakness however he can still move his extremities.  This time, he could barely move his left side and his left arm and his left leg were also numb.  Additionally, reportedly, the patient had some twitching movement witnessed by family members and was less responsive than his baseline.  Upon arrival to the ER, the patient was seen by teleneurology for code stroke and had another episode of clinical seizure, characterized by left gaze deviation, unresponsiveness and sustained full body low-amplitude jerking movements.  It lasted approximately 5 minutes in duration and terminated following administration of 2 mg of IV Ativan .  Neurology recommended loading dose of IV Keppra  4.5 g once, then 1 g twice daily.  Admitted by [facility], hospitalist service.  At the time of this visit, the patient is alert and responding to questions appropriately.  He has a mild speech impediment.  ED Course: Temperature 193/106, pulse 95, respiratory rate 21 O2 saturation 100% on room air.  Review of Systems: Review of systems as noted in the HPI. All other systems reviewed and are negative.   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 two simple tubular adenomas removed. patient was advised to go to [facility] for colonoscopy but he did not keep appt.   IR CT HEAD LTD  02/02/2021   IR PERCUTANEOUS ART THROMBECTOMY/INFUSION INTRACRANIAL INC DIAG ANGIO  02/02/2021   RADIOLOGY WITH ANESTHESIA N/A 02/02/2021   Procedure: IR WITH ANESTHESIA;  Surgeon: Dolphus Carrion, MD;  Location: [facility] OR;  Service: Radiology;  Laterality: N/A;   TEE WITHOUT CARDIOVERSION N/A 09/10/2020   Procedure: TRANSESOPHAGEAL ECHOCARDIOGRAM (TEE) WITH PROPOFOL ;  Surgeon: Debera Jayson MATSU, MD;  Location: [facility] ORS;  Service: Cardiovascular;  Laterality: N/A;   XI ROBOTIC LAPAROSCOPIC ASSISTED APPENDECTOMY N/A 10/09/2023   Procedure: XI ROBOTIC LAPAROSCOPIC ASSISTED APPENDECTOMY;  Surgeon: Evonnie Dorothyann LABOR, DO;  Location: [facility] ORS;  Service: General;  Laterality: N/A;    Social History:  reports that he has quit smoking. His smoking use included cigarettes. He started smoking about 41 years ago. He has a 41.1 pack-year smoking history. He has  never used smokeless tobacco. He reports that he does not currently use alcohol after a past usage of about 3.0 standard drinks of alcohol per week. He reports that he does not currently use drugs.   Allergies[1]  Family History  Problem Relation Age of Onset   COPD Father    Stroke Brother    Pulmonary embolism Sister    Throat cancer Brother    Arthritis Mother    Diabetes Mother    Stroke Mother    Hypertension Brother    Diabetes Brother    Arthritis Brother    Diabetes Sister    Colon cancer Neg Hx       Prior to Admission medications  Medication Sig Start Date End Date Taking? Authorizing Provider  amLODipine  (NORVASC ) 5 MG tablet Take 1 tablet (5 mg total) by mouth daily. Patient not taking: Reported on 12/26/2024 01/17/24   Tobie Suzzane POUR, MD  aspirin  EC 81 MG EC tablet Take 1 tablet (81 mg total) by mouth daily. Swallow whole. 02/13/21   Bailey-Modzik, Delila A, NP  atorvastatin  (LIPITOR) 40 MG tablet Take 1 tablet (40 mg total) by mouth daily. 01/17/24   Tobie Suzzane POUR, MD  candesartan (ATACAND) 4 MG tablet Take 4 mg by mouth daily. 12/08/24   [provider]  levETIRAcetam  (KEPPRA ) 500 MG tablet Take 1 tablet by mouth twice daily 09/12/24   McCue, Jessica, NP  levothyroxine  (SYNTHROID ) 50 MCG tablet Take 1 tablet (50 mcg total) by mouth daily before breakfast. 07/18/24   Tobie Suzzane POUR, MD  lisinopril  (ZESTRIL ) 2.5 MG tablet Take 1 tablet by mouth once daily 11/27/24   Patel, Rutwik K, MD  Multiple Vitamin (MULTIVITAMIN) tablet Take 1 tablet by mouth daily.    [provider]  pantoprazole  (PROTONIX ) 40 MG tablet Take 1 tablet (40 mg total) by mouth daily. 01/17/24   Tobie Suzzane POUR, MD  polyethylene glycol (MIRALAX  / GLYCOLAX ) 17 g packet Take 17 g by mouth daily. 10/16/23   Evonnie Lenis, MD  potassium chloride  (KLOR-CON ) 20 MEQ packet Take 20 mEq by mouth daily. X 7 days 10/16/23   Evonnie Lenis, MD  PRADAXA  150 MG CAPS capsule Take 1 capsule (150 mg total) by  mouth every 12 (twelve) hours. 01/17/24   Tobie Suzzane POUR, MD  senna-docusate (SENOKOT-S) 8.6-50 MG tablet Take 1 tablet by mouth at bedtime. 02/12/21   Bailey-Modzik, Delila A, NP  tamsulosin  (FLOMAX ) 0.4 MG CAPS capsule TAKE 1 CAPSULE BY MOUTH ONCE DAILY AFTER SUPPER 11/06/24   Tobie Suzzane POUR, MD    Physical Exam: BP (!) 157/102   Pulse 99   Resp 16   SpO2 97%   General: 64 y.o. year-old male well developed well nourished in no acute distress.  Alert and oriented x3. Cardiovascular: Regular rate and rhythm with no rubs or gallops.  No thyromegaly or JVD noted.  No lower extremity edema. 2/4 pulses in all 4 extremities. Respiratory: Clear to auscultation with no wheezes or rales. Good inspiratory effort. Abdomen: Soft nontender nondistended with normal bowel sounds x4 quadrants. Muskuloskeletal: No cyanosis, clubbing or edema noted bilaterally Neuro: Left sided hemiparesis. Skin: No ulcerative lesions noted.  Chronic venous stasis. Psychiatry: Judgement and insight  appear normal. Mood is appropriate for condition and setting          Labs on Admission:  Basic Metabolic Panel: Recent Labs  Lab 12/27/24 2303 12/27/24 2307  NA 141 142  K 3.4* 3.4*  CL 104 103  CO2 23  --   GLUCOSE 142* 142*  BUN 9 8  CREATININE 1.33* 1.50*  CALCIUM  8.9  --    Liver Function Tests: Recent Labs  Lab 12/27/24 2303  AST 23  ALT 14  ALKPHOS 151*  BILITOT 0.5  PROT 7.9  ALBUMIN 4.2   No results for input(s): LIPASE, AMYLASE in the last 168 hours. No results for input(s): AMMONIA in the last 168 hours. CBC: Recent Labs  Lab 12/27/24 2303 12/27/24 2307  WBC 6.2  --   NEUTROABS 3.3  --   HGB 13.4 14.3  HCT 40.7 42.0  MCV 83.1  --   PLT 179  --    Cardiac Enzymes: No results for input(s): CKTOTAL, CKMB, CKMBINDEX, TROPONINI in the last 168 hours.  BNP (last 3 results) No results for input(s): BNP in the last 8760 hours.  ProBNP (last 3 results) No results for  input(s): PROBNP in the last 8760 hours.  CBG: Recent Labs  Lab 12/27/24 2302  GLUCAP 138*    Radiological Exams on Admission: CT HEAD CODE STROKE WO CONTRAST (LKW 0-4.5h, LVO 0-24h) Result Date: 12/27/2024 CLINICAL DATA:  Initial evaluation for acute neuro deficit, stroke suspected, left-sided immobility. EXAM: CT HEAD WITHOUT CONTRAST CT ANGIOGRAPHY HEAD AND NECK TECHNIQUE: Multidetector CT imaging of the head and neck was performed using the standard protocol during bolus administration of intravenous contrast. Multiplanar CT image reconstructions and MIPs were obtained to evaluate the vascular anatomy. Carotid stenosis measurements (when applicable) are obtained utilizing NASCET criteria, using the distal internal carotid diameter as the denominator. RADIATION DOSE REDUCTION: This exam was performed according to the departmental dose-optimization program which includes automated exposure control, adjustment of the mA and/or kV according to patient size and/or use of iterative reconstruction technique. CONTRAST:  OMNIPAQUE  IOHEXOL  350 MG/ML SOLN COMPARISON:  Comparison made with prior MRI from 02/23/2022. FINDINGS: CT HEAD FINDINGS Brain: Cerebral volume within normal limits. Changes of chronic microvascular ischemic disease noted about the supratentorial cerebral white matter. Extensive encephalomalacia within the right cerebral hemisphere, consistent with a large remote right MCA distribution infarct. No acute intracranial hemorrhage. No acute large vessel territory infarct. No mass lesion or midline shift. Mild ex vacuo dilatation of the right lateral ventricle without hydrocephalus. No extra-axial fluid collection. Vascular: No abnormal hyperdense vessel. Skull: Scalp soft tissues within normal limits.  Calvarium intact. Sinuses/Orbits: Left gaze preference noted. Mild scattered mucosal thickening about the ethmoidal air cells. Right maxillary sinus retention cyst. No significant mastoid  effusion. Other: Aspects equals 10. Review of the MIP images confirms the above findings CTA NECK FINDINGS Aortic arch: Visualized aortic arch within normal limits for caliber with standard 3 vessel morphology. Mild aortic atherosclerosis. No stenosis about the origin the great vessels. Right carotid system: Right common and internal carotid arteries are patent without dissection. Mild atheromatous change about the right carotid bulb without stenosis. Left carotid system: Left common and internal carotid arteries are patent without dissection. Mild atheromatous change about the left carotid bulb without stenosis. Vertebral arteries: Both vertebral arteries arise from subclavian arteries. Vertebral arteries are diminutive bilaterally but patent without stenosis or dissection. Skeleton: No worrisome osseous lesions. Other neck: A shin no other acute finding. Ovoid cystic  lesion measuring 3.0 x 2.5 cm at the right submandibular region, likely a sebaceous cyst (series 6, image 73). Few small thyroid  nodules noted, largest of which measures 1.8 cm on the right. These have been previously evaluated by thyroid  ultrasound on 01/26/2021. Upper chest: No other acute finding. Review of the MIP images confirms the above findings CTA HEAD FINDINGS Anterior circulation: Mild atheromatous change about the carotid siphons without hemodynamically significant stenosis. A1 segments patent bilaterally. Normal anterior greater artery complex. Anterior cerebral arteries patent without stenosis. No M1 stenosis or occlusion. No proximal MCA branch occlusion. Right MCA branches are attenuated as compared to the left, in keeping with the chronic right MCA distribution infarct. Left MCA branches well perfused. Posterior circulation: Diminutive V4 segments are patent without stenosis. Left PICA patent. Right PICA origin not well seen. Basilar markedly diminutive but patent without stenosis. Superior cerebral arteries patent bilaterally. Fetal  type origin of the PCAs bilaterally. Both PCAs patent to their distal aspects without significant stenosis. Venous sinuses: Patent allowing for timing the contrast bolus. Anatomic variants: Fetal type origin of the PCAs with overall diminutive vertebrobasilar system. No aneurysm. Review of the MIP images confirms the above findings IMPRESSION: CT HEAD: 1. No acute intracranial abnormality. 2. Aspects equals 10. 3. Large remote right MCA distribution infarct. 4. Underlying chronic microvascular ischemic disease. CTA HEAD AND NECK: 1. Negative CTA for large vessel occlusion or other emergent finding. 2. Mild atheromatous change about the carotid bifurcations and carotid siphons without hemodynamically significant stenosis. 3. Fetal type origin of the PCAs with overall diminutive vertebrobasilar system. 4.  Aortic Atherosclerosis (ICD10-I70.0). Results communicated by telephone at the time of interpretation on 12/27/2024 at 11:29 pm to provider [facility]. Electronically Signed   By: Morene Hoard M.D.   On: 12/27/2024 23:43   CT ANGIO HEAD NECK W WO CM (CODE STROKE) Result Date: 12/27/2024 CLINICAL DATA:  Initial evaluation for acute neuro deficit, stroke suspected, left-sided immobility. EXAM: CT HEAD WITHOUT CONTRAST CT ANGIOGRAPHY HEAD AND NECK TECHNIQUE: Multidetector CT imaging of the head and neck was performed using the standard protocol during bolus administration of intravenous contrast. Multiplanar CT image reconstructions and MIPs were obtained to evaluate the vascular anatomy. Carotid stenosis measurements (when applicable) are obtained utilizing NASCET criteria, using the distal internal carotid diameter as the denominator. RADIATION DOSE REDUCTION: This exam was performed according to the departmental dose-optimization program which includes automated exposure control, adjustment of the mA and/or kV according to patient size and/or use of iterative reconstruction technique. CONTRAST:   OMNIPAQUE  IOHEXOL  350 MG/ML SOLN COMPARISON:  Comparison made with prior MRI from 02/23/2022. FINDINGS: CT HEAD FINDINGS Brain: Cerebral volume within normal limits. Changes of chronic microvascular ischemic disease noted about the supratentorial cerebral white matter. Extensive encephalomalacia within the right cerebral hemisphere, consistent with a large remote right MCA distribution infarct. No acute intracranial hemorrhage. No acute large vessel territory infarct. No mass lesion or midline shift. Mild ex vacuo dilatation of the right lateral ventricle without hydrocephalus. No extra-axial fluid collection. Vascular: No abnormal hyperdense vessel. Skull: Scalp soft tissues within normal limits.  Calvarium intact. Sinuses/Orbits: Left gaze preference noted. Mild scattered mucosal thickening about the ethmoidal air cells. Right maxillary sinus retention cyst. No significant mastoid effusion. Other: Aspects equals 10. Review of the MIP images confirms the above findings CTA NECK FINDINGS Aortic arch: Visualized aortic arch within normal limits for caliber with standard 3 vessel morphology. Mild aortic atherosclerosis. No stenosis about the origin the great vessels.  Right carotid system: Right common and internal carotid arteries are patent without dissection. Mild atheromatous change about the right carotid bulb without stenosis. Left carotid system: Left common and internal carotid arteries are patent without dissection. Mild atheromatous change about the left carotid bulb without stenosis. Vertebral arteries: Both vertebral arteries arise from subclavian arteries. Vertebral arteries are diminutive bilaterally but patent without stenosis or dissection. Skeleton: No worrisome osseous lesions. Other neck: A shin no other acute finding. Ovoid cystic lesion measuring 3.0 x 2.5 cm at the right submandibular region, likely a sebaceous cyst (series 6, image 73). Few small thyroid  nodules noted, largest of which measures  1.8 cm on the right. These have been previously evaluated by thyroid  ultrasound on 01/26/2021. Upper chest: No other acute finding. Review of the MIP images confirms the above findings CTA HEAD FINDINGS Anterior circulation: Mild atheromatous change about the carotid siphons without hemodynamically significant stenosis. A1 segments patent bilaterally. Normal anterior greater artery complex. Anterior cerebral arteries patent without stenosis. No M1 stenosis or occlusion. No proximal MCA branch occlusion. Right MCA branches are attenuated as compared to the left, in keeping with the chronic right MCA distribution infarct. Left MCA branches well perfused. Posterior circulation: Diminutive V4 segments are patent without stenosis. Left PICA patent. Right PICA origin not well seen. Basilar markedly diminutive but patent without stenosis. Superior cerebral arteries patent bilaterally. Fetal type origin of the PCAs bilaterally. Both PCAs patent to their distal aspects without significant stenosis. Venous sinuses: Patent allowing for timing the contrast bolus. Anatomic variants: Fetal type origin of the PCAs with overall diminutive vertebrobasilar system. No aneurysm. Review of the MIP images confirms the above findings IMPRESSION: CT HEAD: 1. No acute intracranial abnormality. 2. Aspects equals 10. 3. Large remote right MCA distribution infarct. 4. Underlying chronic microvascular ischemic disease. CTA HEAD AND NECK: 1. Negative CTA for large vessel occlusion or other emergent finding. 2. Mild atheromatous change about the carotid bifurcations and carotid siphons without hemodynamically significant stenosis. 3. Fetal type origin of the PCAs with overall diminutive vertebrobasilar system. 4.  Aortic Atherosclerosis (ICD10-I70.0). Results communicated by telephone at the time of interpretation on 12/27/2024 at 11:29 pm to provider [facility]. Electronically Signed   By: Morene Hoard M.D.   On: 12/27/2024 23:43     EKG: I independently viewed the EKG done and my findings are as followed: Sinus tachycardia 106.  QTc 444.  Assessment/Plan Present on Admission: **None**  Principal Problem:   Seizure (HCC)  Breakthrough seizures in the setting of seizure disorder Continue IV Keppra  1 g twice daily as recommended by teleneurology Follow EEG and MRI brain with and without contrast, recommended by teleneurology Seizure and fall precautions IV Ativan  for breakthrough seizures as needed  Worsening left-sided hemiparesis, POA Rule out acute CVA Ongoing permissive hypertension until acute CVA is ruled out Treat SBP greater than 220 or DBP greater than 180 IV labetalol  as needed with parameters Frequent neurochecks Stroke workup in place Closely monitor on telemetry.  History of DVT on Pradaxa  Resume home Pradaxa  as recommended by teleneurology  Hypothyroidism Resume home levothyroxine .  GERD Resume home PPI.  Obesity BMI 38 Recommend weight loss outpatient with regular physical activity and healthy dieting.  Hypokalemia Serum potassium 3.4 repleted intravenously. Repeat BMP.  Prediabetes Last hemoglobin A1c 5.9 on 07/17/2024 N.p.o. until passes swallow evaluation at bedside   Critical care time: 55 minutes.   DVT prophylaxis: Home Pradaxa .  Code Status: Full code.  Family Communication: None at bedside.  Disposition  Plan: Admitted to stepdown unit.  Consults called: Teleneurology  Admission status: Inpatient status.   Status is: Inpatient The patient requires at least 2 midnight for further evaluation and treatment of present condition.   Terry LOISE Hurst MD [facility] Pager [phone]  If 7PM-7AM, please contact night-coverage [email or web address] Password [email or web address]  12/28/2024, 1:19 AM      [1] No Known Allergies  "

## 2024-12-28 NOTE — TOC Initial Note (Signed)
 Transition of Care [facility]) - Initial/Assessment Note    Patient Details  Name: Matthew Wise MRN: [id number] Date of Birth: [birth year]  Transition of Care [facility]) CM/SW Contact:    Noreen KATHEE Cleotilde ISRAEL Phone Number: 12/28/2024, 10:56 AM  Clinical Narrative:                  CSW spoke with patient at bedside to discuss PT recommendation for CIR and to assess him. Patient seemed agreeable to CIR. CSW did go over alternative options such as SNF and HH so he can know the difference within duration of time and days he would received therapy in each setting. Patient reports that it is him and his friend in the home together. He reports that he has family that provides him with transportation and that his friend helps him when he needs it. Patient reports having a cane at home. ICM will continue to follow regarding CIR.     Expected Discharge Plan: IP Rehab Facility Barriers to Discharge: Continued Medical Work up   Patient Goals and CMS Choice Patient states their goals for this hospitalization and ongoing recovery are:: get well          Expected Discharge Plan and Services In-house Referral: Clinical Social Work   Post Acute Care Choice: Durable Medical Equipment Living arrangements for the past 2 months: Single Family Home                                      Prior Living Arrangements/Services Living arrangements for the past 2 months: Single Family Home Lives with:: Friends, Self Patient language and need for interpreter reviewed:: Yes Do you feel safe going back to the place where you live?: Yes      Need for Family Participation in Patient Care: Yes (Comment) Care giver support system in place?: No (comment)   Criminal Activity/Legal Involvement Pertinent to Current Situation/Hospitalization: No - Comment as needed  Activities of Daily Living   ADL Screening (condition at time of admission) Independently performs ADLs?: No Does the patient have a NEW  difficulty with bathing/dressing/toileting/self-feeding that is expected to last >3 days?: Yes (Initiates electronic notice to provider for possible OT consult) Does the patient have a NEW difficulty with getting in/out of bed, walking, or climbing stairs that is expected to last >3 days?: Yes (Initiates electronic notice to provider for possible PT consult) Does the patient have a NEW difficulty with communication that is expected to last >3 days?: No Is the patient deaf or have difficulty hearing?: No Does the patient have difficulty seeing, even when wearing glasses/contacts?: No Does the patient have difficulty concentrating, remembering, or making decisions?: No  Permission Sought/Granted      Share Information with NAME: Maceo     Permission granted to share info w Relationship: Patient     Emotional Assessment Appearance:: Appears stated age Attitude/Demeanor/Rapport: Engaged Affect (typically observed): Accepting Orientation: : Oriented to Self, Oriented to Place, Oriented to  Time, Oriented to Situation Alcohol / Substance Use: Not Applicable Psych Involvement: No (comment)  Admission diagnosis:  Seizure [facility]) [R56.9] Patient Active Problem List   Diagnosis Date Noted   Seizure (HCC) 12/28/2024   Leg swelling 07/18/2024   Postoperative ileus (HCC) 10/13/2023   Acute pancreatitis 10/08/2023   Skin abscess 10/01/2023   Acute non-recurrent sinusitis 01/15/2023   Gastroesophageal reflux disease 01/15/2023   Seizure with onset of stroke (  HCC) 01/15/2023   Encounter for general adult medical examination with abnormal findings 07/06/2022   Stage 3a chronic kidney disease (HCC) 07/06/2022   Hospital discharge follow-up 04/30/2022   Obesity, Class I, BMI 30-34.9 04/25/2022   Generalized weakness    Near syncope    Non-seasonal allergic rhinitis 03/25/2022   Encounter for examination following treatment at hospital 02/27/2022   BPH (benign prostatic hyperplasia) 09/24/2021    History of CVA with residual deficit 09/24/2021   Dysphagia, post-stroke    Spastic hemiplegia affecting nondominant side (HCC)    Hemiparesis affecting left side as late effect of stroke (HCC)    Thyroid  nodule 01/25/2021   Hypothyroidism 01/25/2021   H/O adenomatous polyp of colon 12/30/2020   Essential hypertension 11/28/2020   Mixed hyperlipidemia 11/28/2020   H/O medication noncompliance 09/07/2020   Nicotine  dependence 12/05/2019   Prediabetes 10/24/2019   Tubular adenoma of colon 09/14/2017   Long term (current) use of anticoagulants 11/14/2014   Sickle cell trait 08/29/2012   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 [address] [place] - [address] [place] [address] Phone: [phone] Fax: [phone]     Social Drivers of Health (SDOH) Social History: SDOH Screenings   Food Insecurity: No Food Insecurity (12/28/2024)  Housing: Low Risk (12/28/2024)  Transportation Needs: No Transportation Needs (12/28/2024)  Utilities: Not At Risk (12/28/2024)  Alcohol Screen: Low Risk (12/21/2023)  Depression (PHQ2-9): Low Risk (12/26/2024)  Financial Resource Strain: Low Risk (12/21/2023)  Physical Activity: Insufficiently Active (12/26/2024)  Social Connections: Socially Isolated (12/26/2024)  Stress: No Stress Concern Present (12/26/2024)  Tobacco Use: Medium Risk (12/27/2024)  Health Literacy: Adequate Health Literacy (12/26/2024)   SDOH Interventions:     Readmission Risk Interventions    12/28/2024   10:53 AM  Readmission Risk Prevention Plan  Medication Screening Complete  Transportation Screening Complete

## 2024-12-28 NOTE — Plan of Care (Signed)
" °  Problem: Acute Rehab PT Goals(only PT should resolve) Goal: Pt Will Go Supine/Side To Sit Outcome: Progressing Flowsheets (Taken 12/28/2024 1239) Pt will go Supine/Side to Sit:  with minimal assist  with moderate assist Goal: Patient Will Transfer Sit To/From Stand Outcome: Progressing Flowsheets (Taken 12/28/2024 1239) Patient will transfer sit to/from stand:  with minimal assist  with moderate assist Goal: Pt Will Transfer Bed To Chair/Chair To Bed Outcome: Progressing Flowsheets (Taken 12/28/2024 1239) Pt will Transfer Bed to Chair/Chair to Bed:  with min assist  with mod assist Goal: Pt Will Ambulate Outcome: Progressing Flowsheets (Taken 12/28/2024 1239) Pt will Ambulate:  25 feet  with minimal assist  with moderate assist  with cane Note: Quad-cane   12:40 PM, 12/28/24 Lynwood Music, MPT Physical Therapist with [facility] [phone] office [phone] mobile phone  "

## 2024-12-28 NOTE — ED Notes (Signed)
 Pt endorses having a prior stroke and being able to move left side pretty well and is not able to move left side at all today.

## 2024-12-28 NOTE — Progress Notes (Signed)
 Routine EEG complete. Results pending.

## 2024-12-28 NOTE — Plan of Care (Signed)
" °  Problem: Acute Rehab OT Goals (only OT should resolve) Goal: Pt. Will Perform Eating Flowsheets (Taken 12/28/2024 1544) Pt Will Perform Eating: with modified independence Goal: Pt. Will Perform Grooming Flowsheets (Taken 12/28/2024 1544) Pt Will Perform Grooming: with modified independence Goal: Pt. Will Perform Upper Body Dressing Flowsheets (Taken 12/28/2024 1544) Pt Will Perform Upper Body Dressing: with modified independence Goal: Pt. Will Perform Lower Body Dressing Flowsheets (Taken 12/28/2024 1544) Pt Will Perform Lower Body Dressing:  with min assist  with adaptive equipment  sitting/lateral leans Goal: Pt. Will Transfer To Toilet Flowsheets (Taken 12/28/2024 1544) Pt Will Transfer to Toilet:  with modified independence  stand pivot transfer  ambulating Goal: Pt. Will Perform Toileting-Clothing Manipulation Flowsheets (Taken 12/28/2024 1544) Pt Will Perform Toileting - Clothing Manipulation and hygiene: with modified independence Goal: Pt/Caregiver Will Perform Home Exercise Program Flowsheets (Taken 12/28/2024 1544) Pt/caregiver will Perform Home Exercise Program:  Increased strength  Increased ROM  Left upper extremity  With minimal assist Goal: OT Additional ADL Goal #1 Flowsheets (Taken 12/28/2024 1544) Additional ADL Goal #1: Pt will demonstrate improve L visual field function by identifying obejcts in L visual field without cuing 3/5 attempts.  Hettie Roselli OT, MOT  "

## 2024-12-29 ENCOUNTER — Inpatient Hospital Stay (HOSPITAL_COMMUNITY)

## 2024-12-29 DIAGNOSIS — G40909 Epilepsy, unspecified, not intractable, without status epilepticus: Secondary | ICD-10-CM | POA: Diagnosis not present

## 2024-12-29 LAB — MISC LABCORP TEST (SEND OUT): Labcorp test code: 83935

## 2024-12-29 LAB — LIPID PANEL
Cholesterol: 138 mg/dL (ref 0–200)
HDL: 24 mg/dL — ABNORMAL LOW
LDL Cholesterol: 100 mg/dL — ABNORMAL HIGH (ref 0–99)
Total CHOL/HDL Ratio: 5.8 ratio
Triglycerides: 70 mg/dL
VLDL: 14 mg/dL (ref 0–40)

## 2024-12-29 NOTE — TOC CAGE-AID Note (Signed)
 Transition of Care [facility]) - CAGE-AID Screening   Patient Details  Name: Matthew Wise MRN: [id number] Date of Birth: 1961-04-07  Transition [facility]) CM/SW Contact:    Almarie CHRISTELLA Goodie, LCSW Phone Number: 12/29/2024, 1:32 PM   Clinical Narrative:   No current substance abuse noted.    CAGE-AID Screening:    Have You Ever Felt You Ought to Cut Down on Your Drinking or Drug Use?: No Have People Annoyed You By Critizing Your Drinking Or Drug Use?: No Have You Felt Bad Or Guilty About Your Drinking Or Drug Use?: No Have You Ever Had a Drink or Used Drugs First Thing In The Morning to Steady Your Nerves or to Get Rid of a Hangover?: No CAGE-AID Score: 0  Substance Abuse Education Offered: No

## 2024-12-29 NOTE — Progress Notes (Signed)
 Occupational Therapy Treatment Patient Details Name: Matthew Wise MRN: [id number] DOB: 08-May-1961 Today's Date: 12/29/2024   History of present illness Pt is a 64 y.o male presented to [facility] 1/28 for L sided weakness. In ED appeared to experience seizure like activity lasting 5 minutes. MRI showed  restricted diffusion within the right hippocampus, favor postictal changes & Large remote R MCA territory infarct. Small remote infarcts in the pons and cerebellum. Transferred to [facility] for further neuro eval. PMH includes: CVA, CKD III, DVT, HTN, PE, HLD, seizures   OT comments  Pt progressing towards goals. Progressed bed mobility to min to mod assist, pt with good strength in RUE to assist. Noted beginning flexor synergy pattern in elbow and wrist, pt with minimal digit AROM. Used tapping to gain extension in elbow to 30 degrees. Attempted to stand, pt with good effort to power up, however unable to stabilize in standing to transition RUE to support. Pt is significant below baseline, and is highly motivated to return to PLOF of Mod I. Continue to recommend >3 hours of skilled rehab daily to optimize independence levels. Will continue to follow acutely.       If plan is discharge home, recommend the following:  A lot of help with walking and/or transfers;A lot of help with bathing/dressing/bathroom;Assistance with cooking/housework;Assistance with feeding;Assist for transportation;Help with stairs or ramp for entrance   Equipment Recommendations  Other (comment) (Defer to next venue)    Recommendations for Other Services Rehab consult    Precautions / Restrictions Precautions Precautions: Fall Recall of Precautions/Restrictions: Intact Restrictions Weight Bearing Restrictions Per Provider Order: No       Mobility Bed Mobility Overal bed mobility: Needs Assistance Bed Mobility: Supine to Sit, Sit to Supine     Supine to sit: Min assist Sit to supine: Mod assist   General bed mobility  comments: Min assist for LLE with cueing for technique, mod assist to return BLEs to bed    Transfers Overall transfer level: Needs assistance Equipment used:  (Use of chair)   Sit to Stand: Max assist           General transfer comment: Pt with good effort with RUE, pt unable to rise with max effort from pt and therapist. Can clear hips, but pt unable to use LUE to assist with balance     Balance Overall balance assessment: Needs assistance Sitting-balance support: Feet supported, No upper extremity supported Sitting balance-Leahy Scale: Fair     ADL either performed or assessed with clinical judgement   ADL Overall ADL's : Needs assistance/impaired Eating/Feeding: Set up;Sitting       General ADL Comments: Session focused on functional mobility and stretching of LUE    Extremity/Trunk Assessment Upper Extremity Assessment Upper Extremity Assessment: LUE deficits/detail;Right hand dominant LUE Deficits / Details: Noted beginning flexor synergy pattern in elbow, wrist, and hand. Using taping able to extend elbow to 20 degrees. Little AROM of digits. Will continue to monitor for splint needs LUE Sensation: decreased light touch;decreased proprioception LUE Coordination: decreased fine motor;decreased gross motor   Lower Extremity Assessment Lower Extremity Assessment: Defer to PT evaluation        Vision   Vision Assessment?: Yes Convergence: Impaired (comment) Visual Fields: Left visual field deficit Additional Comments: Pt with approx 10-20 degrees of temporal visual field on L side, pt with difficulty following commands for testing         Communication Communication Communication: No apparent difficulties   Cognition Arousal: Alert Behavior During  Therapy: WFL for tasks assessed/performed, Anxious Cognition: Cognition impaired     Awareness: Online awareness impaired Memory impairment (select all impairments): Short-term memory   Executive functioning  impairment (select all impairments): Problem solving, Reasoning OT - Cognition Comments: Pt with decreased memory, unable to recall events of hospitalization, decreased problem solving and reasoning for task. Pt with poor mental flexibility, declining to try different standing techniques from baseline technique. Poor ability to follow command for visual field testing       Following commands: Impaired Following commands impaired: Follows multi-step commands with increased time      Cueing   Cueing Techniques: Verbal cues, Tactile cues        General Comments EEG wires attached, noted one loose wire, RN and Neurologist notified.    Pertinent Vitals/ Pain       Pain Assessment Pain Assessment: No/denies pain   Frequency  Min 3X/week        Progress Toward Goals  OT Goals(current goals can now be found in the care plan section)  Progress towards OT goals: Progressing toward goals  Acute Rehab OT Goals Patient Stated Goal: To get better OT Goal Formulation: With patient Time For Goal Achievement: 01/11/25 Potential to Achieve Goals: Good ADL Goals Pt Will Perform Eating: with modified independence Pt Will Perform Grooming: with modified independence Pt Will Perform Upper Body Dressing: with modified independence Pt Will Perform Lower Body Dressing: with min assist;with adaptive equipment;sitting/lateral leans Pt Will Transfer to Toilet: with modified independence;stand pivot transfer;ambulating Pt Will Perform Toileting - Clothing Manipulation and hygiene: with modified independence Pt/caregiver will Perform Home Exercise Program: Increased strength;Increased ROM;Left upper extremity;With minimal assist Additional ADL Goal #1: Pt will demonstrate improve L visual field function by identifying obejcts in L visual field without cuing 3/5 attempts.  Plan         AM-PAC OT 6 Clicks Daily Activity     Outcome Measure   Help from another person eating meals?: A  Little Help from another person taking care of personal grooming?: A Little Help from another person toileting, which includes using toliet, bedpan, or urinal?: Total Help from another person bathing (including washing, rinsing, drying)?: A Lot Help from another person to put on and taking off regular upper body clothing?: A Lot Help from another person to put on and taking off regular lower body clothing?: A Lot 6 Click Score: 13    End of Session    OT Visit Diagnosis: Unsteadiness on feet (R26.81);Other abnormalities of gait and mobility (R26.89);Muscle weakness (generalized) (M62.81);Other symptoms and signs involving the nervous system (R29.898);Hemiplegia and hemiparesis Hemiplegia - Right/Left: Left Hemiplegia - dominant/non-dominant: Non-Dominant Hemiplegia - caused by: Cerebral infarction   Activity Tolerance Patient tolerated treatment well   Patient Left in bed;with call bell/phone within reach;with bed alarm set   Nurse Communication Mobility status        Time: 1440-1520 OT Time Calculation (min): 40 min  Charges: OT General Charges $OT Visit: 1 Visit OT Treatments $Self Care/Home Management : 38-52 mins  Adrianne BROCKS, OT  Acute Rehabilitation Services Office [phone] Secure chat preferred   Adrianne GORMAN [employer] 12/29/2024, 4:41 PM

## 2024-12-29 NOTE — Progress Notes (Signed)
 LTM EEG hooked up and running - no initial skin breakdown - push button tested - [facility] monitoring.CT leads used

## 2024-12-29 NOTE — Progress Notes (Signed)
 PT Cancellation Note  Patient Details Name: Matthew Wise MRN: [id number] DOB: 01/01/1961   Cancelled Treatment:    Reason Eval/Treat Not Completed: Patient at procedure or test/unavailable;Other (comment). Pt working with OT, remains hooked up to LTM EEG and unable to be unhooked as it has not been 24hrs since last seizure. Lines current significant limitation to OOB mobility. PT will hold therapy today due to medical limitations with ongoing monitoring. PT will continue to follow patient and continue with mobility activity and exercise when available.  Isaiah DEL. Chalsea Darko, PT, DPT  [employer] 12/29/2024, 2:48 PM

## 2024-12-29 NOTE — Progress Notes (Signed)
 Subjective: No acute events overnight.  Denies any concerns.  Working with PT.  ROS: negative except above Examination  Vital signs in last 24 hours: Temp:  [98.4 F (36.9 C)-99.3 F (37.4 C)] 98.4 F (36.9 C) (01/30 1255) Pulse Rate:  [63-72] 70 (01/30 1255) Resp:  [11-20] 20 (01/30 1255) BP: (141-170)/(71-87) 144/84 (01/30 1255) SpO2:  [100 %] 100 % (01/30 1255)  General: Sitting in bed, not in apparent distress Neuro: MS: Alert, oriented, follows commands CN: pupils equal and reactive,  EOMI, face symmetric, tongue midline, normal sensation over face Motor: 3/5 left upper extremity, 5/5 in right upper extremity, 4/5 in left lower extremity, 5/5 in right lower extremity  Coordination: n FTN intact in right upper extremity Gait: not tested  Basic Metabolic Panel: Recent Labs  Lab 12/27/24 01-21-2302 12/27/24 2307 12/28/24 0700  NA 141 142 142  K 3.4* 3.4* 3.6  CL 104 103 102  CO2 23  --  28  GLUCOSE 142* 142* 132*  BUN 9 8 9   CREATININE 1.33* 1.50* 1.37*  CALCIUM  8.9  --  9.0  MG  --   --  2.0    CBC: Recent Labs  Lab 12/27/24 01-21-02 12/27/24 2306/01/21  WBC 6.2  --   NEUTROABS 3.3  --   HGB 13.4 14.3  HCT 40.7 42.0  MCV 83.1  --   PLT 179  --      Coagulation Studies: Recent Labs    12/27/24 Jan 21, 2302  LABPROT 16.4*  INR 1.3*    Imaging No new brain imaging overnight   ASSESSMENT AND PLAN:64 year old male with history of prior stroke and subsequent seizures presented with breakthrough seizure, no clear etiology.   Epilepsy with breakthrough seizure - No clear etiology.  MRI brain reviewed, most likely postictal changes. - On Keppra  500 mg twice daily at home.  Keppra  levels were not checked on arrival.  However patient does report compliance.   - As no other clear etiology, will increase Keppra  to 1000 mg twice daily for now -Continue Pradaxa  -Goal blood pressure: Normotension - Continue seizure precautions - If no seizures overnight, okay to discharge  tomorrow from neurology standpoint - Please continue to follow-up with [facility] - Discussed plan with patient at bedside   Seizure precautions: Per [place]  DMV statutes, patients with seizures are not allowed to drive until they have been seizure-free for six months and cleared by a physician    Use caution when using heavy equipment or power tools. Avoid working on ladders or at heights. Take showers instead of baths. Ensure the water  temperature is not too high on the home water  heater. Do not go swimming alone. Do not lock yourself in a room alone (i.e. bathroom). When caring for infants or small children, sit down when holding, feeding, or changing them to minimize risk of injury to the child in the event you have a seizure. Maintain good sleep hygiene. Avoid alcohol.    If patient has another seizure, call 911 and bring them back to the ED if: A.  The seizure lasts longer than 5 minutes.      B.  The patient doesn't wake shortly after the seizure or has new problems such as difficulty seeing, speaking or moving following the seizure C.  The patient was injured during the seizure D.  The patient has a temperature over 102 F (39C) E.  The patient vomited during the seizure and now is having trouble breathing    During the  Seizure   - First, ensure adequate ventilation and place patients on the floor on their left side  Loosen clothing around the neck and ensure the airway is patent. If the patient is clenching the teeth, do not force the mouth open with any object as this can cause severe damage - Remove all items from the surrounding that can be hazardous. The patient may be oblivious to what's happening and may not even know what he or she is doing. If the patient is confused and wandering, either gently guide him/her away and block access to outside areas - Reassure the individual and be comforting - Call 911. In most cases, the seizure ends before EMS  arrives. However, there are cases when seizures may last over 3 to 5 minutes. Or the individual may have developed breathing difficulties or severe injuries. If a pregnant patient or a person with diabetes develops a seizure, it is prudent to call an ambulance.     After the Seizure (Postictal Stage)   After a seizure, most patients experience confusion, fatigue, muscle pain and/or a headache. Thus, one should permit the individual to sleep. For the next few days, reassurance is essential. Being calm and helping reorient the person is also of importance.   Most seizures are painless and end spontaneously. Seizures are not harmful to others but can lead to complications such as stress on the lungs, brain and the heart. Individuals with prior lung problems may develop labored breathing and respiratory distress.      I personally spent a total of 37 minutes in the care of the patient today including getting/reviewing separately obtained history, performing a medically appropriate exam/evaluation, counseling and educating, placing orders, referring and communicating with other health care professionals, documenting clinical information in the EHR, independently interpreting results, and coordinating care.       Arlin Krebs Epilepsy [facility] For questions after 5pm please refer to AMION to reach the Neurologist on call

## 2024-12-30 ENCOUNTER — Inpatient Hospital Stay (HOSPITAL_COMMUNITY)

## 2024-12-30 DIAGNOSIS — G40909 Epilepsy, unspecified, not intractable, without status epilepticus: Secondary | ICD-10-CM | POA: Diagnosis not present

## 2024-12-30 LAB — GLUCOSE, CAPILLARY
Glucose-Capillary: 118 mg/dL — ABNORMAL HIGH (ref 70–99)
Glucose-Capillary: 103 mg/dL — ABNORMAL HIGH (ref 70–99)
Glucose-Capillary: 113 mg/dL — ABNORMAL HIGH (ref 70–99)

## 2024-12-30 MED ORDER — TAMSULOSIN HCL 0.4 MG PO CAPS
0.4000 mg | ORAL_CAPSULE | Freq: Every day | ORAL | Status: DC
  Administered 2024-12-30 – 2025-01-03 (×5): 0.4 mg via ORAL
  Filled 2024-12-30 (×5): qty 1

## 2024-12-30 MED ORDER — LIDOCAINE HCL URETHRAL/MUCOSAL 2 % EX GEL
1.0000 | Freq: Once | CUTANEOUS | Status: AC
  Administered 2024-12-30: 1 via URETHRAL
  Filled 2024-12-30: qty 6

## 2024-12-30 NOTE — Progress Notes (Addendum)
 " PROGRESS NOTE    Matthew Wise  FMW:[id number] DOB: 1961/02/04 DOA: 12/27/2024 PCP: Tobie Suzzane POUR, MD  Subjective: Patient awake in the bed.  He has electrodes on continuous EEG is running.  He complains of dribbling urine today.  He has a history of BPH takes Flomax  at home but not on his med list.  I asked RN to check a bladder scan postvoid.  Make sure he is not having urinary retention..  No further seizures per his report.    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  Radiology recommends correlating with the EEG. Ongoing permissive hypertension until acute CVA is ruled out Treat SBP greater than 220 or DBP greater than 180 IV labetalol  as needed with parameters Frequent neurochecks Stroke workup in place Closely monitor on telemetry.   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   GERD Resume home PPI.   Obesity BMI 38 Recommend weight loss outpatient with regular physical activity and healthy dieting.   Hypokalemia Serum potassium 3.4 repleted intravenously. Repeat BMP.   Prediabetes Last hemoglobin A1c 5.9 on 07/17/2024 N.p.o. until passes swallow evaluation at bedside  BPH/ Urinary Retention - RN reports dribbling incontinence.  Check bladder scan.  May have acute on chronic urinary retention.  May need Foley.  Tells me he takes Flomax  at home but not on his med list.  Start Flomax  this morning. Addendum:  PVR showed greater than 500 mL in the bladder.  RN had difficulty placing standard Foley catheter.  Coud catheter placed by RN from fourth floor difficult Foley team.  Visual hallucinations -After I rounded on the patient he reported to the nurse that he was seeing  hallucinations of dogs.  Asked RN to notify neurology.     DVT prophylaxis: Home Pradaxa .   Code Status: Full code.   Family Communication: None at bedside.   Disposition Plan: Admitted to stepdown unit.   Consults called: Teleneurology   Admission status: Inpatient status.     Reason for continuing need for hospitalization:  appears medically stable for discharge.  Objective: Vitals:   12/29/24 1654 12/29/24 2031 12/29/24 2300 12/30/24 0800  BP: (!) 121/47 (!) 142/82 (!) 148/101 135/79  Pulse: 70 67 71 64  Resp: 16 17 16 16   Temp: 99.1 F (37.3 C) 99.1 F (37.3 C) 100.1 F (37.8 C) 98.8 F (37.1 C)  TempSrc: Oral Oral Oral Oral  SpO2: 97% 98% 99%   Weight:      Height:        Intake/Output Summary (Last 24 hours) at 12/30/2024 1100 Last data filed at 12/30/2024 1010 Gross per 24 hour  Intake 390 ml  Output 1450 ml  Net -1060 ml   Filed Weights   12/28/24 0300  Weight: 128.1 kg    Examination:  Physical Exam  Data Reviewed: I have personally reviewed following labs and imaging studies  CBC: Recent Labs  Lab 12/27/24 2303 12/27/24 2307  WBC 6.2  --   NEUTROABS  3.3  --   HGB 13.4 14.3  HCT 40.7 42.0  MCV 83.1  --   PLT 179  --    Basic Metabolic Panel: Recent Labs  Lab 12/27/24 2303 12/27/24 2307 12/28/24 0700  NA 141 142 142  K 3.4* 3.4* 3.6  CL 104 103 102  CO2 23  --  28  GLUCOSE 142* 142* 132*  BUN 9 8 9   CREATININE 1.33* 1.50* 1.37*  CALCIUM  8.9  --  9.0  MG  --   --  2.0   GFR: Estimated Creatinine Clearance (by C-G formula based on SCr of 1.37 mg/dL (H)) Male: 36.0 mL/min (A) Male: 77.3 mL/min (A) Liver Function Tests: Recent Labs  Lab 12/27/24 2303  AST  23  ALT 14  ALKPHOS 151*  BILITOT 0.5  PROT 7.9  ALBUMIN 4.2   No results for input(s): LIPASE, AMYLASE in the last 168 hours. No results for input(s): AMMONIA in the last 168 hours. Coagulation Profile: Recent Labs  Lab 12/27/24 2303  INR 1.3*   Cardiac Enzymes: No results for input(s): CKTOTAL, CKMB, CKMBINDEX, TROPONINI in the last 168 hours. ProBNP, BNP (last 5 results) No results for input(s): PROBNP, BNP in the last 8760 hours. HbA1C: No results for input(s): HGBA1C in the last 72 hours. CBG: Recent Labs  Lab 12/27/24 2302 12/30/24 0604  GLUCAP 138* 118*   Lipid Profile: Recent Labs    12/29/24 0423  CHOL 138  HDL 24*  LDLCALC 100*  TRIG 70  CHOLHDL 5.8   Thyroid  Function Tests: No results for input(s): TSH, T4TOTAL, FREET4, T3FREE, THYROIDAB in the last 72 hours. Anemia Panel: No results for input(s): VITAMINB12, FOLATE, FERRITIN, TIBC, IRON, RETICCTPCT in the last 72 hours. Sepsis Labs: No results for input(s): PROCALCITON, LATICACIDVEN in the last 168 hours.  Recent Results (from the past 240 hours)  MRSA Next Gen by PCR, Nasal     Status: None   Collection Time: 12/28/24  3:14 AM   Specimen: Nasal Mucosa; Nasal Swab  Result Value Ref Range Status   MRSA by PCR Next Gen NOT DETECTED NOT DETECTED Final    Comment: (NOTE) The GeneXpert MRSA Assay (FDA approved for NASAL specimens only), is one component of a comprehensive MRSA colonization surveillance program. It is not intended to diagnose MRSA infection nor to guide or monitor treatment for MRSA infections. Test performance is not FDA approved in patients less than 12 years old. Performed at [facility], [address], [place], [place] [address]      Radiology Studies: Overnight EEG with video Result Date: 12/30/2024 Shelton Arlin KIDD, MD     12/30/2024 10:06 AM Patient Name: Matthew Wise MRN: [id number] Epilepsy Attending: Arlin KIDD Shelton  Referring Physician/Provider: Shelton Arlin KIDD, MD Duration: 12/29/2024 0950 to 12/30/2024 1000  Patient history:  64 year old male with history of prior stroke and subsequent seizures presented with breakthrough seizure, no clear etiology. EEG to evaluate for seizure  Level of alertness: Awake, asleep  AEDs during EEG study: LEV  Technical aspects: This EEG study was done with scalp electrodes positioned according to the 10-20 International system of electrode placement. Electrical activity was reviewed with band pass filter of 1-70Hz , sensitivity of 7 uV/mm, display speed of 75mm/sec with a 60Hz  notched filter applied as appropriate. EEG data were recorded continuously and digitally stored.  Video monitoring was available and reviewed as appropriate.  Description: The posterior dominant rhythm consists of 9 Hz activity of moderate voltage (25-35 uV) seen predominantly  in posterior head regions, symmetric and reactive to eye opening and eye closing. Sleep was characterized by vertex waves, sleep spindles (12 to 14 Hz), maximal frontocentral region. EEG showed continuous 3 to 6 Hz theta-delta slowing in right hemisphere. Lateralized periodic discharges were noted in right hemisphere at 0.5-1hz  which gradually resolved. Hyperventilation and photic stimulation were not performed.    ABNORMALITY - Lateralized periodic discharges, right hemisphere - Continuous slow, right hemisphere  IMPRESSION: This study showed evidence of epileptogenicity and cortical dysfunction arising from right hemisphere likely secondary to underlying structural abnormality. No seizures were seen throughout the recording.  Arlin MALVA Krebs   EEG adult Result Date: 12/28/2024 Krebs Arlin MALVA, MD     12/28/2024  5:12 PM Patient Name: SUKHDEEP WIETING MRN: [id number] Epilepsy Attending: Arlin MALVA Krebs Referring Physician/Provider: Shona Terry SAILOR, DO Date: 12/28/2024 Duration: 22.25 mins Patient history:  64 year old male with history of prior  stroke and subsequent seizures presented with breakthrough seizure, no clear etiology. EEG to evaluate for seizure Level of alertness: Awake AEDs during EEG study: LEV Technical aspects: This EEG study was done with scalp electrodes positioned according to the 10-20 International system of electrode placement. Electrical activity was reviewed with band pass filter of 1-70Hz , sensitivity of 7 uV/mm, display speed of 23mm/sec with a 60Hz  notched filter applied as appropriate. EEG data were recorded continuously and digitally stored.  Video monitoring was available and reviewed as appropriate. Description: The posterior dominant rhythm consists of 9 Hz activity of moderate voltage (25-35 uV) seen predominantly in posterior head regions, symmetric and reactive to eye opening and eye closing. EEG showed continuous 3 to 6 Hz theta-delta slowing in right hemisphere. Lateralized periodic discharges were noted in right hemisphere at 0.5-1hz . Hyperventilation and photic stimulation were not performed.   This study was technically difficult due to significant myogenic artifact ABNORMALITY - Lateralized periodic discharges, right hemisphere - Continuous slow, right hemisphere IMPRESSION: This technically difficult study showed evidence of epileptogenicity and cortical dysfunction arising from right hemisphere likely secondary to underlying structural abnormality and increased risk of seizure recurrence. No seizures were seen throughout the recording. Arlin MALVA Krebs   ECHOCARDIOGRAM COMPLETE Result Date: 12/28/2024    ECHOCARDIOGRAM REPORT   Patient Name:   NAJIR ROOP Date of Exam: 12/28/2024 Medical Rec #:  [id number]       Height:       72.0 in Accession #:    [id number]      Weight:       282.4 lb Date of Birth:  [birth year]      BSA:          2.467 m Patient Age:    63 years        BP:           152/91 mmHg Patient Gender: M               HR:           63 bpm. Exam Location:  [facility] Procedure: 2D Echo, Cardiac  Doppler and Color Doppler (Both Spectral and Color            Flow Doppler were utilized during procedure). Indications:    Stroke  History:        Patient has prior history of Echocardiogram examinations, most                 recent 01/22/2021.  Sonographer:    Odella Brewster Referring Phys: [id number] TERRY SAILOR SHONA  Sonographer Comments: Image acquisition challenging due to respiratory motion. IMPRESSIONS  1. Left ventricular ejection fraction, by estimation, is 60 to 65%. The left ventricle has normal function. The left ventricle has no regional wall motion abnormalities. The left ventricular internal cavity size was mildly dilated. There is mild concentric left ventricular hypertrophy. Left ventricular diastolic parameters are consistent with Grade I diastolic dysfunction (impaired relaxation).  2. Right ventricular systolic function is normal. The right ventricular size is normal. Tricuspid regurgitation signal is inadequate for assessing PA pressure.  3. The mitral valve is grossly normal. Trivial mitral valve regurgitation.  4. The aortic valve is tricuspid. Aortic valve regurgitation is trivial. No aortic stenosis is present. Aortic valve mean gradient measures 3.0 mmHg.  5. The inferior vena cava is normal in size with greater than 50% respiratory variability, suggesting right atrial pressure of 3 mmHg. Comparison(s): Prior images reviewed side by side. LVEF 60-65% without regional wall motion abnormalities. Grade 1 diastolic dysfunction. FINDINGS  Left Ventricle: Left ventricular ejection fraction, by estimation, is 60 to 65%. The left ventricle has normal function. The left ventricle has no regional wall motion abnormalities. The left ventricular internal cavity size was mildly dilated. There is  mild concentric left ventricular hypertrophy. Left ventricular diastolic parameters are consistent with Grade I diastolic dysfunction (impaired relaxation). Right Ventricle: The right ventricular size is normal. No  increase in right ventricular wall thickness. Right ventricular systolic function is normal. Tricuspid regurgitation signal is inadequate for assessing PA pressure. Left Atrium: Left atrial size was normal in size. Right Atrium: Right atrial size was normal in size. Pericardium: Trivial pericardial effusion is present. The pericardial effusion is posterior to the left ventricle. Presence of epicardial fat layer. Mitral Valve: The mitral valve is grossly normal. Trivial mitral valve regurgitation. MV peak gradient, 4.0 mmHg. The mean mitral valve gradient is 2.0 mmHg. Tricuspid Valve: The tricuspid valve is grossly normal. Tricuspid valve regurgitation is trivial. Aortic Valve: The aortic valve is tricuspid. Aortic valve regurgitation is trivial. No aortic stenosis is present. Aortic valve mean gradient measures 3.0 mmHg. Aortic valve peak gradient measures 6.7 mmHg. Aortic valve area, by VTI measures 3.31 cm. Pulmonic Valve: The pulmonic valve was grossly normal. Pulmonic valve regurgitation is trivial. Aorta: The aortic root is normal in size and structure. Venous: The inferior vena cava is normal in size with greater than 50% respiratory variability, suggesting right atrial pressure of 3 mmHg. IAS/Shunts: No atrial level shunt detected by color flow Doppler. Additional Comments: 3D was performed not requiring image post processing on an independent workstation and was indeterminate.  LEFT VENTRICLE PLAX 2D LVIDd:         5.80 cm      Diastology LVIDs:         3.70 cm      LV e' medial:    5.55 cm/s LV PW:         1.50 cm      LV E/e' medial:  10.6 LV IVS:        1.10 cm      LV e' lateral:   5.11 cm/s LVOT diam:     2.30 cm      LV E/e' lateral: 11.5 LV SV:         86 LV SV Index:   35 LVOT Area:     4.15 cm LV IVRT:       98 msec  LV Volumes (MOD) LV vol d, MOD A2C: 180.0 ml LV vol d,  MOD A4C: 157.0 ml LV vol s, MOD A2C: 64.7 ml LV vol s, MOD A4C: 63.5 ml LV SV MOD A2C:     115.3 ml LV SV MOD A4C:     157.0 ml  LV SV MOD BP:      104.8 ml RIGHT VENTRICLE RV S prime:     10.60 cm/s  PULMONARY VEINS                             Diastolic Velocity: 31.30 cm/s                             S/D Velocity:       1.50                             Systolic Velocity:  48.40 cm/s LEFT ATRIUM             Index LA diam:        4.10 cm 1.66 cm/m LA Vol (A2C):   55.8 ml 22.62 ml/m LA Vol (A4C):   33.1 ml 13.42 ml/m LA Biplane Vol: 46.2 ml 18.73 ml/m  AORTIC VALVE                    PULMONIC VALVE AV Area (Vmax):    2.96 cm     PV Vmax:       1.15 m/s AV Area (Vmean):   3.52 cm     PV Peak grad:  5.3 mmHg AV Area (VTI):     3.31 cm AV Vmax:           129.00 cm/s AV Vmean:          82.200 cm/s AV VTI:            0.260 m AV Peak Grad:      6.7 mmHg AV Mean Grad:      3.0 mmHg LVOT Vmax:         91.80 cm/s LVOT Vmean:        69.600 cm/s LVOT VTI:          0.207 m LVOT/AV VTI ratio: 0.80  AORTA Ao Root diam: 3.30 cm MITRAL VALVE MV Area (PHT): 2.72 cm    SHUNTS MV Area VTI:   3.32 cm    Systemic VTI:  0.21 m MV Peak grad:  4.0 mmHg    Systemic Diam: 2.30 cm MV Mean grad:  2.0 mmHg MV Vmax:       1.00 m/s MV Vmean:      61.7 cm/s MV Decel Time: 279 msec MV E velocity: 58.70 cm/s MV A velocity: 78.10 cm/s MV E/A ratio:  0.75 Jayson Sierras MD Electronically signed by Jayson Sierras MD Signature Date/Time: 12/28/2024/12:47:13 PM    Final     Scheduled Meds:  Chlorhexidine  Gluconate Cloth  6 each Topical Q0600   dabigatran   150 mg Oral Q12H   labetalol   10 mg Intravenous Once   levETIRAcetam   1,000 mg Intravenous BID   levothyroxine   50 mcg Oral QAC breakfast   pantoprazole   40 mg Oral Daily   tamsulosin   0.4 mg Oral Daily   Continuous Infusions:   LOS: 2 days   Time spent: 30 minutes  Lonni KANDICE Moose, MD  [facility]  12/30/2024, 11:00 AM   "

## 2024-12-30 NOTE — Procedures (Addendum)
 Patient Name: Matthew Wise  MRN: [id number]  Epilepsy Attending: Arlin MALVA Krebs  Referring Physician/Provider: Krebs Arlin MALVA, MD Duration: 12/29/2024 0950 to 12/30/2024 0950   Patient history:  64 year old male with history of prior stroke and subsequent seizures presented with breakthrough seizure, no clear etiology. EEG to evaluate for seizure   Level of alertness: Awake, asleep   AEDs during EEG study: LEV   Technical aspects: This EEG study was done with scalp electrodes positioned according to the 10-20 International system of electrode placement. Electrical activity was reviewed with band pass filter of 1-70Hz , sensitivity of 7 uV/mm, display speed of 85mm/sec with a 60Hz  notched filter applied as appropriate. EEG data were recorded continuously and digitally stored.  Video monitoring was available and reviewed as appropriate.   Description: The posterior dominant rhythm consists of 9 Hz activity of moderate voltage (25-35 uV) seen predominantly in posterior head regions, symmetric and reactive to eye opening and eye closing. Sleep was characterized by vertex waves, sleep spindles (12 to 14 Hz), maximal frontocentral region. EEG showed continuous 3 to 6 Hz theta-delta slowing in right hemisphere. Lateralized periodic discharges were noted in right hemisphere at 0.5-1hz  which gradually resolved. Hyperventilation and photic stimulation were not performed.      ABNORMALITY - Lateralized periodic discharges, right hemisphere - Continuous slow, right hemisphere   IMPRESSION: This study showed evidence of epileptogenicity and cortical dysfunction arising from right hemisphere likely secondary to underlying structural abnormality. No seizures were seen throughout the recording.   Matthew Wise

## 2024-12-30 NOTE — Progress Notes (Signed)
 DC LTM, no skin breakdown noted.

## 2024-12-30 NOTE — Plan of Care (Signed)
 Pt verbalized of visual hallucination, informed to Doctor. Was unable to urinate on his own so coude catheter 16 fr inserted.    Problem: Education: Goal: Knowledge of General Education information will improve Description: Including pain rating scale, medication(s)/side effects and non-pharmacologic comfort measures Outcome: Progressing   Problem: Clinical Measurements: Goal: Will remain free from infection Outcome: Progressing   Problem: Activity: Goal: Risk for activity intolerance will decrease Outcome: Progressing   Problem: Nutrition: Goal: Adequate nutrition will be maintained Outcome: Progressing   Problem: Coping: Goal: Level of anxiety will decrease Outcome: Progressing   Problem: Elimination: Goal: Will not experience complications related to bowel motility Outcome: Progressing   Problem: Pain Managment: Goal: General experience of comfort will improve and/or be controlled Outcome: Progressing   Problem: Safety: Goal: Ability to remain free from injury will improve Outcome: Progressing   Problem: Skin Integrity: Goal: Risk for impaired skin integrity will decrease Outcome: Progressing   Problem: Education: Goal: Knowledge of disease or condition will improve Outcome: Progressing   Problem: Ischemic Stroke/TIA Tissue Perfusion: Goal: Complications of ischemic stroke/TIA will be minimized Outcome: Progressing   Problem: Ischemic Stroke/TIA Tissue Perfusion: Goal: Complications of ischemic stroke/TIA will be minimized Outcome: Progressing   Problem: Coping: Goal: Will verbalize positive feelings about self Outcome: Progressing   Problem: Self-Concept: Goal: Level of anxiety will decrease Outcome: Progressing   Problem: Safety: Goal: Verbalization of understanding the information provided will improve Outcome: Progressing

## 2024-12-30 NOTE — Procedures (Addendum)
 Patient Name: Matthew Wise  MRN: [id number]  Epilepsy Attending: Arlin MALVA Krebs  Referring Physician/Provider: Krebs Arlin MALVA, MD Duration: 12/30/2024 0950 to 12/30/2024 1400   Patient history:  64 year old male with history of prior stroke and subsequent seizures presented with breakthrough seizure, no clear etiology. EEG to evaluate for seizure   Level of alertness: Awake, asleep   AEDs during EEG study: LEV   Technical aspects: This EEG study was done with scalp electrodes positioned according to the 10-20 International system of electrode placement. Electrical activity was reviewed with band pass filter of 1-70Hz , sensitivity of 7 uV/mm, display speed of 14mm/sec with a 60Hz  notched filter applied as appropriate. EEG data were recorded continuously and digitally stored.  Video monitoring was available and reviewed as appropriate.   Description: The posterior dominant rhythm consists of 9 Hz activity of moderate voltage (25-35 uV) seen predominantly in posterior head regions, symmetric and reactive to eye opening and eye closing. Sleep was characterized by vertex waves, sleep spindles (12 to 14 Hz), maximal frontocentral region. EEG showed continuous 3 to 6 Hz theta-delta slowing in right hemisphere. Hyperventilation and photic stimulation were not performed.      ABNORMALITY - Continuous slow, right hemisphere   IMPRESSION: This study was suggestive of cortical dysfunction arising from right hemisphere likely secondary to underlying structural abnormality. No seizures were seen throughout the recording.   Erykah Lippert O Tomoko Sandra

## 2024-12-30 NOTE — Plan of Care (Signed)
" °  Problem: Education: Goal: Knowledge of General Education information will improve Description: Including pain rating scale, medication(s)/side effects and non-pharmacologic comfort measures Outcome: Progressing   Problem: Health Behavior/Discharge Planning: Goal: Ability to manage health-related needs will improve Outcome: Progressing   Problem: Clinical Measurements: Goal: Ability to maintain clinical measurements within normal limits will improve Outcome: Progressing Goal: Will remain free from infection Outcome: Progressing Goal: Diagnostic test results will improve Outcome: Progressing Goal: Respiratory complications will improve Outcome: Progressing Goal: Cardiovascular complication will be avoided Outcome: Progressing   Problem: Activity: Goal: Risk for activity intolerance will decrease Outcome: Progressing   Problem: Nutrition: Goal: Adequate nutrition will be maintained Outcome: Progressing   Problem: Coping: Goal: Level of anxiety will decrease Outcome: Progressing   Problem: Elimination: Goal: Will not experience complications related to bowel motility Outcome: Progressing Goal: Will not experience complications related to urinary retention Outcome: Progressing   Problem: Pain Managment: Goal: General experience of comfort will improve and/or be controlled Outcome: Progressing   Problem: Safety: Goal: Ability to remain free from injury will improve Outcome: Progressing   Problem: Skin Integrity: Goal: Risk for impaired skin integrity will decrease Outcome: Progressing   Problem: Education: Goal: Knowledge of disease or condition will improve Outcome: Progressing Goal: Knowledge of secondary prevention will improve (MUST DOCUMENT ALL) Outcome: Progressing Goal: Knowledge of patient specific risk factors will improve (DELETE if not current risk factor) Outcome: Progressing   Problem: Ischemic Stroke/TIA Tissue Perfusion: Goal: Complications of  ischemic stroke/TIA will be minimized Outcome: Progressing   Problem: Coping: Goal: Will verbalize positive feelings about self Outcome: Progressing Goal: Will identify appropriate support needs Outcome: Progressing   Problem: Health Behavior/Discharge Planning: Goal: Ability to manage health-related needs will improve Outcome: Progressing Goal: Goals will be collaboratively established with patient/family Outcome: Progressing   Problem: Self-Care: Goal: Ability to participate in self-care as condition permits will improve Outcome: Progressing Goal: Verbalization of feelings and concerns over difficulty with self-care will improve Outcome: Progressing Goal: Ability to communicate needs accurately will improve Outcome: Progressing   Problem: Nutrition: Goal: Risk of aspiration will decrease Outcome: Progressing Goal: Dietary intake will improve Outcome: Progressing   Problem: Education: Goal: Expressions of having a comfortable level of knowledge regarding the disease process will increase Outcome: Progressing   Problem: Coping: Goal: Ability to adjust to condition or change in health will improve Outcome: Progressing Goal: Ability to identify appropriate support needs will improve Outcome: Progressing   Problem: Health Behavior/Discharge Planning: Goal: Compliance with prescribed medication regimen will improve Outcome: Progressing   Problem: Medication: Goal: Risk for medication side effects will decrease Outcome: Progressing   Problem: Clinical Measurements: Goal: Complications related to the disease process, condition or treatment will be avoided or minimized Outcome: Progressing Goal: Diagnostic test results will improve Outcome: Progressing   Problem: Safety: Goal: Verbalization of understanding the information provided will improve Outcome: Progressing   Problem: Self-Concept: Goal: Level of anxiety will decrease Outcome: Progressing Goal: Ability to  verbalize feelings about condition will improve Outcome: Progressing   "

## 2024-12-31 DIAGNOSIS — I69398 Other sequelae of cerebral infarction: Secondary | ICD-10-CM

## 2024-12-31 DIAGNOSIS — G40909 Epilepsy, unspecified, not intractable, without status epilepticus: Secondary | ICD-10-CM | POA: Diagnosis not present

## 2024-12-31 DIAGNOSIS — R338 Other retention of urine: Secondary | ICD-10-CM

## 2024-12-31 DIAGNOSIS — G40919 Epilepsy, unspecified, intractable, without status epilepticus: Secondary | ICD-10-CM

## 2024-12-31 DIAGNOSIS — G8194 Hemiplegia, unspecified affecting left nondominant side: Secondary | ICD-10-CM | POA: Diagnosis not present

## 2024-12-31 LAB — COMPREHENSIVE METABOLIC PANEL WITH GFR
ALT: 9 U/L (ref 0–44)
AST: 16 U/L (ref 15–41)
Albumin: 3.4 g/dL — ABNORMAL LOW (ref 3.5–5.0)
Alkaline Phosphatase: 107 U/L (ref 38–126)
Anion gap: 10 (ref 5–15)
BUN: 14 mg/dL (ref 8–23)
CO2: 27 mmol/L (ref 22–32)
Calcium: 8.8 mg/dL — ABNORMAL LOW (ref 8.9–10.3)
Chloride: 101 mmol/L (ref 98–111)
Creatinine, Ser: 1.41 mg/dL — ABNORMAL HIGH (ref 0.61–1.24)
GFR, Estimated: 56 mL/min — ABNORMAL LOW
Glucose, Bld: 110 mg/dL — ABNORMAL HIGH (ref 70–99)
Potassium: 3.3 mmol/L — ABNORMAL LOW (ref 3.5–5.1)
Sodium: 139 mmol/L (ref 135–145)
Total Bilirubin: 0.8 mg/dL (ref 0.0–1.2)
Total Protein: 6.7 g/dL (ref 6.5–8.1)

## 2024-12-31 LAB — CBC
HCT: 37.1 % — ABNORMAL LOW (ref 39.0–52.0)
Hemoglobin: 12.9 g/dL — ABNORMAL LOW (ref 13.0–17.0)
MCH: 28 pg (ref 26.0–34.0)
MCHC: 34.8 g/dL (ref 30.0–36.0)
MCV: 80.7 fL (ref 80.0–100.0)
Platelets: 183 10*3/uL (ref 150–400)
RBC: 4.6 MIL/uL (ref 4.22–5.81)
RDW: 13.7 % (ref 11.5–15.5)
WBC: 6.8 10*3/uL (ref 4.0–10.5)
nRBC: 0 % (ref 0.0–0.2)

## 2024-12-31 LAB — HEMOGLOBIN A1C
Hgb A1c MFr Bld: 6 % — ABNORMAL HIGH (ref 4.8–5.6)
Mean Plasma Glucose: 125.5 mg/dL

## 2024-12-31 LAB — CK: Total CK: 191 U/L (ref 49–397)

## 2024-12-31 LAB — MAGNESIUM: Magnesium: 1.9 mg/dL (ref 1.7–2.4)

## 2024-12-31 MED ORDER — POTASSIUM CHLORIDE CRYS ER 20 MEQ PO TBCR
60.0000 meq | EXTENDED_RELEASE_TABLET | Freq: Once | ORAL | Status: AC
  Administered 2024-12-31: 60 meq via ORAL
  Filled 2024-12-31: qty 3

## 2024-12-31 MED ORDER — ATORVASTATIN CALCIUM 40 MG PO TABS
40.0000 mg | ORAL_TABLET | Freq: Every day | ORAL | Status: DC
  Administered 2024-12-31 – 2025-01-03 (×4): 40 mg via ORAL
  Filled 2024-12-31 (×4): qty 1

## 2024-12-31 NOTE — Plan of Care (Signed)
" °  Problem: Education: Goal: Knowledge of General Education information will improve Description: Including pain rating scale, medication(s)/side effects and non-pharmacologic comfort measures Outcome: Progressing   Problem: Health Behavior/Discharge Planning: Goal: Ability to manage health-related needs will improve Outcome: Progressing   Problem: Clinical Measurements: Goal: Ability to maintain clinical measurements within normal limits will improve Outcome: Progressing Goal: Will remain free from infection Outcome: Progressing Goal: Diagnostic test results will improve Outcome: Progressing Goal: Respiratory complications will improve Outcome: Progressing Goal: Cardiovascular complication will be avoided Outcome: Progressing   Problem: Activity: Goal: Risk for activity intolerance will decrease Outcome: Progressing   Problem: Nutrition: Goal: Adequate nutrition will be maintained Outcome: Progressing   Problem: Coping: Goal: Level of anxiety will decrease Outcome: Progressing   Problem: Elimination: Goal: Will not experience complications related to bowel motility Outcome: Progressing Goal: Will not experience complications related to urinary retention Outcome: Progressing   Problem: Pain Managment: Goal: General experience of comfort will improve and/or be controlled Outcome: Progressing   Problem: Safety: Goal: Ability to remain free from injury will improve Outcome: Progressing   Problem: Skin Integrity: Goal: Risk for impaired skin integrity will decrease Outcome: Progressing   Problem: Education: Goal: Knowledge of disease or condition will improve Outcome: Progressing Goal: Knowledge of secondary prevention will improve (MUST DOCUMENT ALL) Outcome: Progressing Goal: Knowledge of patient specific risk factors will improve (DELETE if not current risk factor) Outcome: Progressing   Problem: Ischemic Stroke/TIA Tissue Perfusion: Goal: Complications of  ischemic stroke/TIA will be minimized Outcome: Progressing   Problem: Coping: Goal: Will verbalize positive feelings about self Outcome: Progressing Goal: Will identify appropriate support needs Outcome: Progressing   Problem: Health Behavior/Discharge Planning: Goal: Ability to manage health-related needs will improve Outcome: Progressing Goal: Goals will be collaboratively established with patient/family Outcome: Progressing   Problem: Self-Care: Goal: Ability to participate in self-care as condition permits will improve Outcome: Progressing Goal: Verbalization of feelings and concerns over difficulty with self-care will improve Outcome: Progressing Goal: Ability to communicate needs accurately will improve Outcome: Progressing   Problem: Nutrition: Goal: Risk of aspiration will decrease Outcome: Progressing Goal: Dietary intake will improve Outcome: Progressing   Problem: Education: Goal: Expressions of having a comfortable level of knowledge regarding the disease process will increase Outcome: Progressing   Problem: Coping: Goal: Ability to adjust to condition or change in health will improve Outcome: Progressing Goal: Ability to identify appropriate support needs will improve Outcome: Progressing   Problem: Health Behavior/Discharge Planning: Goal: Compliance with prescribed medication regimen will improve Outcome: Progressing   Problem: Medication: Goal: Risk for medication side effects will decrease Outcome: Progressing   Problem: Clinical Measurements: Goal: Complications related to the disease process, condition or treatment will be avoided or minimized Outcome: Progressing Goal: Diagnostic test results will improve Outcome: Progressing   Problem: Safety: Goal: Verbalization of understanding the information provided will improve Outcome: Progressing   Problem: Self-Concept: Goal: Level of anxiety will decrease Outcome: Progressing Goal: Ability to  verbalize feelings about condition will improve Outcome: Progressing   "

## 2024-12-31 NOTE — Plan of Care (Signed)
 Communicated well. No complaints from his side.    Problem: Education: Goal: Knowledge of General Education information will improve Description: Including pain rating scale, medication(s)/side effects and non-pharmacologic comfort measures Outcome: Progressing   Problem: Clinical Measurements: Goal: Will remain free from infection Outcome: Progressing   Problem: Activity: Goal: Risk for activity intolerance will decrease Outcome: Progressing   Problem: Nutrition: Goal: Adequate nutrition will be maintained Outcome: Progressing   Problem: Coping: Goal: Level of anxiety will decrease Outcome: Progressing   Problem: Pain Managment: Goal: General experience of comfort will improve and/or be controlled Outcome: Progressing   Problem: Safety: Goal: Ability to remain free from injury will improve Outcome: Progressing   Problem: Skin Integrity: Goal: Risk for impaired skin integrity will decrease Outcome: Progressing   Problem: Education: Goal: Knowledge of disease or condition will improve Outcome: Progressing   Problem: Coping: Goal: Will verbalize positive feelings about self Outcome: Progressing   Problem: Coping: Goal: Will identify appropriate support needs Outcome: Progressing   Problem: Self-Care: Goal: Ability to participate in self-care as condition permits will improve Outcome: Progressing   Problem: Self-Care: Goal: Verbalization of feelings and concerns over difficulty with self-care will improve Outcome: Progressing   Problem: Nutrition: Goal: Risk of aspiration will decrease Outcome: Progressing   Problem: Nutrition: Goal: Dietary intake will improve Outcome: Progressing   Problem: Coping: Goal: Ability to identify appropriate support needs will improve Outcome: Progressing   Problem: Safety: Goal: Verbalization of understanding the information provided will improve Outcome: Progressing   Problem: Self-Concept: Goal: Level of anxiety  will decrease Outcome: Progressing

## 2024-12-31 NOTE — Progress Notes (Signed)
 Foley draining well overnight with 850 ml of amber clear urine. No overnight distress.

## 2024-12-31 NOTE — Progress Notes (Signed)
 " PROGRESS NOTE  Matthew Wise FMW:[id number] DOB: 10-May-1961   PCP: Tobie Suzzane POUR, MD  Patient is from: Home.  Lives with significant other.  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   In ED, the patient was seen by teleneurology for code stroke and had another episode of clinical seizure, characterized by left gaze deviation, unresponsiveness and sustained full body low-amplitude jerking movements.  It lasted approximately 5 minutes in duration and terminated following administration of 2 mg of IV Ativan .  CT head and CT angio head and neck without acute finding.  Neurology recommended loading dose of IV Keppra  4.5 g once, then 1 g twice daily, MRI brain and LTM EEG.    Patient was transferred to [facility] for LTM EEG.  MRI brain showed signal abnormality and restricted diffusion within the right hippocampus favoring postictal change versus acute infarct, and large remote right MCA territory infarct.  LTM EEG showed evidence of epileptogenicity and cortical dysfunction arising from the right hemisphere but no active seizure.  Neurology recommended continuing Keppra  1 g twice daily.  Therapy recommended CIR.  Subjective: Seen and examined earlier this morning.  No major events overnight or this morning.  No complaint this morning.  Visual hallucination resolved.  No further episodes since yesterday morning.   Assessment and plan: Breakthrough seizures in the setting of seizure disorder-reports compliance with home Keppra .  Symptoms, imaging  and LTM EEG as above.  Was on Keppra  500 mg twice daily at home. -Keppra  increased to 1 g twice daily by neurology -IV Ativan  as needed breakthrough seizure.   Worsening left-sided hemiparesis, POA: MRI brain without acute CVA.  Weakness likely recrudescence of symptoms of prior CVA.  Acute CVA ruled out. -Continue low-dose aspirin  and Pradaxa  -Resume home Lipitor. -PT/OT-recommended CIR.  Visual hallucinations: Had an episode on 1/31.  No further episode. -Ok to continue Keppra  per neurology.  BPH/ Urinary Retention: Due to missed doses of Flomax ? -Continue Foley catheter today -Voiding trial before discharge -Continue home Flomax   Hypokalemia -Monitor replenish K and Mg as appropriate  History of DVT on Pradaxa  -Continue home Pradaxa .  CKD-3A: Stable.   Hypothyroidism -Continue home Synthroid    GERD -Continue PPI   Prediabetes: A1c 5.9% in 06/2024. -Recheck hemoglobin A1c          Class II obesity Body mass index is 38.3 kg/m.           DVT prophylaxis:   dabigatran  (PRADAXA ) capsule 150 mg  Code Status: Full code Family Communication: None at bedside Level of care: Telemetry Status is: Inpatient Remains inpatient appropriate because: Breakthrough seizure and increased sided weakness.   Final disposition: CIR   55 minutes with more than 50% spent in reviewing records, counseling patient/family and coordinating care.  Consultants:  Neurology  Procedures: None  Microbiology summarized: MRSA PCR screen nonreactive.  Objective: Vitals:   12/30/24 2100 12/31/24 0352 12/31/24 0810 12/31/24 1231  BP: 132/72 136/74 125/73 122/64  Pulse: 68 69 76 69  Resp: 16 17 16 16   Temp: 98.9 F (37.2 C)  98.7 F (37.1 C) 98.1 F (36.7 C) 98.8 F (37.1 C)  TempSrc: Oral Oral Oral Oral  SpO2: 96% 95%  99%  Weight:      Height:        Examination:  GENERAL: No apparent distress.  Nontoxic. HEENT: MMM.  Vision and hearing grossly intact.  NECK:  Supple.  No apparent JVD.  RESP:  No IWOB.  Fair aeration bilaterally. CVS:  RRR. Heart sounds normal.  ABD/GI/GU: BS+. Abd soft, NTND.  MSK/EXT: Left-sided weakness, 3/5 in LUE and LLE.  Stasis dermatitis in BLE. SKIN: This is dermatitis in BLE. NEURO: AA.  Oriented appropriately.  No apparent focal neuro deficit other than known left weakness as above PSYCH: Calm. Normal affect.   Sch Meds:  Scheduled Meds:  atorvastatin   40 mg Oral Daily   Chlorhexidine  Gluconate Cloth  6 each Topical Q0600   dabigatran   150 mg Oral Q12H   levETIRAcetam   1,000 mg Intravenous BID   levothyroxine   50 mcg Oral QAC breakfast   pantoprazole   40 mg Oral Daily   tamsulosin   0.4 mg Oral Daily   Continuous Infusions: PRN Meds:.acetaminophen , labetalol , LORazepam , melatonin, polyethylene glycol, prochlorperazine   Antimicrobials: Anti-infectives (From admission, onward)    None        I have personally reviewed the following labs and images: CBC: Recent Labs  Lab 12/27/24 2303 12/27/24 2307 12/31/24 0945  WBC 6.2  --  6.8  NEUTROABS 3.3  --   --   HGB 13.4 14.3 12.9*  HCT 40.7 42.0 37.1*  MCV 83.1  --  80.7  PLT 179  --  183   BMP &GFR Recent Labs  Lab 12/27/24 2303 12/27/24 2307 12/28/24 0700 12/31/24 0945  NA 141 142 142 139  K 3.4* 3.4* 3.6 3.3*  CL 104 103 102 101  CO2 23  --  28 27  GLUCOSE 142* 142* 132* 110*  BUN 9 8 9 14   CREATININE 1.33* 1.50* 1.37* 1.41*  CALCIUM  8.9  --  9.0 8.8*  MG  --   --  2.0 1.9   Estimated Creatinine Clearance (by C-G formula based on SCr of 1.41 mg/dL (H)) Male: 37.8 mL/min (A) Male: 75.1 mL/min (A) Liver & Pancreas: Recent Labs  Lab 12/27/24 2303 12/31/24 0945  AST 23 16  ALT 14 9  ALKPHOS 151* 107  BILITOT 0.5 0.8  PROT 7.9 6.7  ALBUMIN 4.2 3.4*   No results for input(s): LIPASE, AMYLASE in the last 168 hours. No results for input(s): AMMONIA in the last 168 hours. Diabetic: No results for input(s): HGBA1C in the  last 72 hours. Recent Labs  Lab 12/27/24 2302 12/30/24 0604 12/30/24 1229 12/30/24 1635  GLUCAP 138* 118* 103* 113*   Cardiac Enzymes: Recent Labs  Lab 12/31/24 0945  CKTOTAL 191   No results for input(s): PROBNP in the last 8760 hours. Coagulation Profile: Recent Labs  Lab 12/27/24 2303  INR 1.3*   Thyroid  Function Tests: No results for input(s): TSH, T4TOTAL, FREET4, T3FREE, THYROIDAB in the last 72 hours. Lipid Profile: Recent Labs    12/29/24 0423  CHOL 138  HDL 24*  LDLCALC 100*  TRIG 70  CHOLHDL 5.8   Anemia Panel: No results for input(s): VITAMINB12, FOLATE, FERRITIN, TIBC, IRON, RETICCTPCT in the last 72 hours. Urine analysis:    Component Value Date/Time   COLORURINE STRAW (A) 12/28/2024 0030   APPEARANCEUR CLEAR 12/28/2024 0030   LABSPEC 1.020 12/28/2024 0030   PHURINE 6.0 12/28/2024 0030  GLUCOSEU NEGATIVE 12/28/2024 0030   HGBUR NEGATIVE 12/28/2024 0030   BILIRUBINUR NEGATIVE 12/28/2024 0030   KETONESUR NEGATIVE 12/28/2024 0030   PROTEINUR NEGATIVE 12/28/2024 0030   UROBILINOGEN >8.0 (H) 02/03/2015 1854   NITRITE NEGATIVE 12/28/2024 0030   LEUKOCYTESUR NEGATIVE 12/28/2024 0030   Sepsis Labs: Invalid input(s): PROCALCITONIN, LACTICIDVEN  Microbiology: Recent Results (from the past 240 hours)  MRSA Next Gen by PCR, Nasal     Status: None   Collection Time: 12/28/24  3:14 AM   Specimen: Nasal Mucosa; Nasal Swab  Result Value Ref Range Status   MRSA by PCR Next Gen NOT DETECTED NOT DETECTED Final    Comment: (NOTE) The GeneXpert MRSA Assay (FDA approved for NASAL specimens only), is one component of a comprehensive MRSA colonization surveillance program. It is not intended to diagnose MRSA infection nor to guide or monitor treatment for MRSA infections. Test performance is not FDA approved in patients less than 61 years old. Performed at [facility], [address], [place], [place] [address]     Radiology  Studies: No results found.    Azaya Goedde T. Jhayla Podgorski [facility]  If 7PM-7AM, please contact night-coverage [email or web address] 12/31/2024, 1:13 PM   "

## 2024-12-31 NOTE — Progress Notes (Addendum)
 Neurology progress note  Neurology service recalled because patient is having some visual hallucinations.  They only happen yesterday-no further hallucinations.  ROS: negative except above Examination  Vital signs in last 24 hours: Temp:  [98.1 F (36.7 C)-99.5 F (37.5 C)] 98.1 F (36.7 C) (02/01 0810) Pulse Rate:  [64-76] 76 (02/01 0810) Resp:  [16-18] 16 (02/01 0810) BP: (125-136)/(71-83) 125/73 (02/01 0810) SpO2:  [95 %-99 %] 95 % (02/01 0352)  General: Sitting in bed, not in apparent distress Neuro: MS: Alert, oriented, follows commands CN: pupils equal and reactive,  EOMI, face symmetric, tongue midline, normal sensation over face Motor: 3/5 left upper extremity, 5/5 in right upper extremity, 4/5 in left lower extremity, 5/5 in right lower extremity  Coordination: n FTN intact in right upper extremity Gait: not tested Unchanged exam  Basic Metabolic Panel: Recent Labs  Lab 12/27/24 2303 12/27/24 2307 12/28/24 0700 12/31/24 0945  NA 141 142 142 139  K 3.4* 3.4* 3.6 3.3*  CL 104 103 102 101  CO2 23  --  28 27  GLUCOSE 142* 142* 132* 110*  BUN 9 8 9 14   CREATININE 1.33* 1.50* 1.37* 1.41*  CALCIUM  8.9  --  9.0 8.8*  MG  --   --  2.0 1.9    CBC: Recent Labs  Lab 12/27/24 2303 12/27/24 2307 12/31/24 0945  WBC 6.2  --  6.8  NEUTROABS 3.3  --   --   HGB 13.4 14.3 12.9*  HCT 40.7 42.0 37.1*  MCV 83.1  --  80.7  PLT 179  --  183      Imaging No new brain imaging overnight MRI brain with possible postictal changes in the right mesial temporal lobe/hippocampus.  ASSESSMENT 64 year old male with history of prior stroke and subsequent seizures presented with breakthrough seizure, no clear etiology.  Continuous EEG with no evidence of ongoing seizures-right sided slowing.  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  MRI brain reviewed, most likely postictal changes. - On Keppra  500 mg twice daily  at home.  Keppra  levels were not checked on arrival.  However patient does report compliance.   - As no other clear etiology, Keppra  increased to 1000 mg twice daily for now -Continue Pradaxa  -Goal blood pressure: Normotension - Continue seizure precautions No further inpatient neurological workup at this time and okay to discharge from a neurological standpoint Please continue to follow-up with [facility] Discussed plan with patient at bedside and plan relayed to Dr. Kathrin, hospitalist   Seizure precautions: Per   DMV statutes, patients with seizures are not allowed to drive until they have been seizure-free for six months and cleared by a physician    Use caution when using heavy equipment or power tools. Avoid working on ladders or at heights. Take showers instead of baths. Ensure the water  temperature is not too high on the home water  heater. Do not go swimming alone. Do not lock yourself in a room alone (i.e. bathroom). When caring for infants or small children, sit down when holding, feeding, or changing them to minimize risk of injury to the child in the event you have a seizure. Maintain good sleep hygiene. Avoid alcohol.    If patient has another seizure, call 911 and bring them back to the ED if: A.  The seizure lasts longer than 5 minutes.      B.  The patient doesn't wake shortly after the seizure or has new problems such as  difficulty seeing, speaking or moving following the seizure C.  The patient was injured during the seizure D.  The patient has a temperature over 102 F (39C) E.  The patient vomited during the seizure and now is having trouble breathing    During the Seizure   - First, ensure adequate ventilation and place patients on the floor on their left side  Loosen clothing around the neck and ensure the airway is patent. If the patient is clenching the teeth, do not force the mouth open with any object as this can cause severe damage -  Remove all items from the surrounding that can be hazardous. The patient may be oblivious to what's happening and may not even know what he or she is doing. If the patient is confused and wandering, either gently guide him/her away and block access to outside areas - Reassure the individual and be comforting - Call 911. In most cases, the seizure ends before EMS arrives. However, there are cases when seizures may last over 3 to 5 minutes. Or the individual may have developed breathing difficulties or severe injuries. If a pregnant patient or a person with diabetes develops a seizure, it is prudent to call an ambulance.     After the Seizure (Postictal Stage)   After a seizure, most patients experience confusion, fatigue, muscle pain and/or a headache. Thus, one should permit the individual to sleep. For the next few days, reassurance is essential. Being calm and helping reorient the person is also of importance.   Most seizures are painless and end spontaneously. Seizures are not harmful to others but can lead to complications such as stress on the lungs, brain and the heart. Individuals with prior lung problems may develop labored breathing and respiratory distress.    -- Eligio Lav, MD Neurologist [facility]

## 2025-01-01 DIAGNOSIS — G8194 Hemiplegia, unspecified affecting left nondominant side: Secondary | ICD-10-CM | POA: Diagnosis not present

## 2025-01-01 DIAGNOSIS — R338 Other retention of urine: Secondary | ICD-10-CM | POA: Diagnosis not present

## 2025-01-01 DIAGNOSIS — G40919 Epilepsy, unspecified, intractable, without status epilepticus: Secondary | ICD-10-CM | POA: Diagnosis not present

## 2025-01-01 LAB — RENAL FUNCTION PANEL
Albumin: 3.5 g/dL (ref 3.5–5.0)
Anion gap: 7 (ref 5–15)
BUN: 18 mg/dL (ref 8–23)
CO2: 29 mmol/L (ref 22–32)
Calcium: 8.9 mg/dL (ref 8.9–10.3)
Chloride: 101 mmol/L (ref 98–111)
Creatinine, Ser: 1.54 mg/dL — ABNORMAL HIGH (ref 0.61–1.24)
GFR, Estimated: 51 mL/min — ABNORMAL LOW
Glucose, Bld: 98 mg/dL (ref 70–99)
Phosphorus: 2.8 mg/dL (ref 2.5–4.6)
Potassium: 3.8 mmol/L (ref 3.5–5.1)
Sodium: 138 mmol/L (ref 135–145)

## 2025-01-01 LAB — MAGNESIUM: Magnesium: 1.9 mg/dL (ref 1.7–2.4)

## 2025-01-01 NOTE — Progress Notes (Signed)
 Pt received for second session as pt was requesting this therapist to help return him to bed per RN. Similar to previous session. +2 Max A in stedy to transfer. No change in DC/DME recs at this time.        01/01/25 1500  PT Visit Information  Last PT Received On 01/01/25  Assistance Needed +2  History of Present Illness Pt is a 64 y.o male presented to [facility] 1/28 for L sided weakness. In ED appeared to experience seizure like activity lasting 5 minutes. MRI showed  restricted diffusion within the right hippocampus, favor postictal changes & Large remote R MCA territory infarct. Small remote infarcts in the pons and cerebellum. Transferred to [facility] for further neuro eval. PMH includes: CVA, CKD III, DVT, HTN, PE, HLD, seizures  Subjective Data  Subjective Pt agreeable to PT  Precautions  Precautions Fall  Recall of Precautions/Restrictions Intact  Restrictions  Weight Bearing Restrictions Per Provider Order No  Pain Assessment  Pain Assessment No/denies pain  Cognition  Arousal Alert  Behavior During Therapy [facility] for tasks assessed/performed;Anxious  Following Commands  Following commands Impaired  Following commands impaired Follows multi-step commands with increased time  Cueing  Cueing Techniques Verbal cues;Tactile cues  Communication  Communication No apparent difficulties  Bed Mobility  Overal bed mobility Needs Assistance  Bed Mobility Sit to Supine  Sit to supine Min assist  General bed mobility comments Min A for LLE management and to reposition in bed.  Transfers  Overall transfer level Needs assistance  Equipment used Ambulation equipment used  Transfers Sit to/from Stand;Bed to chair/wheelchair/BSC  Sit to Stand +2 physical assistance;Max assist;Min assist  Bed to/from chair/wheelchair/BSC transfer type: Via Lift equipment  Transfer via Lift Equipment Stedy  General transfer comment Pt required assistance bringing knees together into stedy. +2 Max A to stand from low  chair with use of bed pads. Min A to stand from flaps.  Ambulation/Gait  General Gait Details Deferred for safety  Modified Rankin (Stroke Patients Only)  Pre-Morbid Rankin Score 3  Modified Rankin 3  Balance  Overall balance assessment Needs assistance  Sitting-balance support Feet supported;No upper extremity supported  Sitting balance-Leahy Scale Fair  Sitting balance - Comments fair seated at EOB  Standing balance support Reliant on assistive device for balance;Single extremity supported;During functional activity  Standing balance-Leahy Scale Poor  Standing balance comment Reliant on stedy  General Comments  General comments (skin integrity, edema, etc.) VSS  PT - End of Session  Equipment Utilized During Treatment Gait belt  Activity Tolerance Patient tolerated treatment well  Patient left in chair;with call bell/phone within reach;with chair alarm set  Nurse Communication Mobility status   PT - Assessment/Plan  PT Visit Diagnosis Unsteadiness on feet (R26.81);Other abnormalities of gait and mobility (R26.89);Muscle weakness (generalized) (M62.81);Ataxic gait (R26.0)  PT Frequency (ACUTE ONLY) Min 4X/week  Recommendations for Other Services Rehab consult (Reconsult)  Follow Up Recommendations Acute inpatient rehab (3hours/day)  Patient can return home with the following A lot of help with bathing/dressing/bathroom;A lot of help with walking and/or transfers;Help with stairs or ramp for entrance;Assist for transportation;Assistance with cooking/housework  PT equipment Wheelchair (measurements PT);Wheelchair cushion (measurements PT);Hospital bed  AM-PAC PT 6 Clicks Mobility Outcome Measure (Version 2)  Help needed turning from your back to your side while in a flat bed without using bedrails? 3  Help needed moving from lying on your back to sitting on the side of a flat bed without using bedrails? 2  Help needed  moving to and from a bed to a chair (including a wheelchair)? 2   Help needed standing up from a chair using your arms (e.g., wheelchair or bedside chair)? 2  Help needed to walk in hospital room? 2  Help needed climbing 3-5 steps with a railing?  1  6 Click Score 12  Consider Recommendation of Discharge To: CIR/SNF/LTACH  Progressive Mobility  What is the highest level of mobility based on the mobility assessment? Level 2 (Chairfast) - Balance while sitting on edge of bed and cannot stand  Mobility Referral Yes  Activity Mechanically lifted from chair to bed  PT Goal Progression  Progress towards PT goals Progressing toward goals  PT Time Calculation  PT Start Time (ACUTE ONLY) 1340  PT Stop Time (ACUTE ONLY) 1349  PT Time Calculation (min) (ACUTE ONLY) 9 min  PT General Charges  $$ ACUTE PT VISIT 1 Visit  PT Treatments  $Therapeutic Activity 8-22 mins    Tamel Abel B, PT, DPT Acute Rehab Services [phone]

## 2025-01-01 NOTE — Progress Notes (Signed)
 Orthopedic Tech Progress Note Patient Details:  Matthew Wise May 04, 1961 [id number] Resting Hand splint delivered to PT Patient ID: Matthew Wise Matthew Wise, adult   DOB: 08/20/1961, 64 y.o.   MRN: [id number]  Matthew Wise Matthew Wise 01/01/2025, 7:15 PM

## 2025-01-01 NOTE — Progress Notes (Signed)
 Physical Therapy Treatment Patient Details Name: Matthew Wise MRN: [id number] DOB: 04-23-1961 Today's Date: 01/01/2025   History of Present Illness Pt is a 64 y.o male presented to [facility] 1/28 for L sided weakness. In ED appeared to experience seizure like activity lasting 5 minutes. MRI showed  restricted diffusion within the right hippocampus, favor postictal changes & Large remote R MCA territory infarct. Small remote infarcts in the pons and cerebellum. Transferred to [facility] for further neuro eval. PMH includes: CVA, CKD III, DVT, HTN, PE, HLD, seizures    PT Comments  Pt tolerated treatment well today. Co-treat with OT. Pt today able to transfer to chair with stedy with +2 Max A. Pt was able to complete 5 sit to stands with Min A from stedy flaps. Pt remains an excellent candidate for AIR. No change in DC/DME recs at this time. PT will continue to follow.       If plan is discharge home, recommend the following: A lot of help with bathing/dressing/bathroom;A lot of help with walking and/or transfers;Help with stairs or ramp for entrance;Assist for transportation;Assistance with cooking/housework   Can travel by Doctor, Hospital (measurements PT);Wheelchair cushion (measurements PT);Hospital bed    Recommendations for Other Services Rehab consult (Reconsult)     Precautions / Restrictions Precautions Precautions: Fall Recall of Precautions/Restrictions: Intact Restrictions Weight Bearing Restrictions Per Provider Order: No     Mobility  Bed Mobility Overal bed mobility: Needs Assistance Bed Mobility: Supine to Sit     Supine to sit: Min assist     General bed mobility comments: Min A for trunk elevation    Transfers Overall transfer level: Needs assistance Equipment used: Ambulation equipment used Transfers: Sit to/from Stand, Bed to chair/wheelchair/BSC Sit to Stand: +2 physical assistance, Max assist, Min assist            General transfer comment: Pt required assistance bringing knees together into stedy. +2 Max A to stand from low bed with use of bed pads. Min A to stand x5 from flaps Transfer via Lift Equipment: Stedy  Ambulation/Gait               General Gait Details: Deferred for safety   Stairs             Wheelchair Mobility     Tilt Bed    Modified Rankin (Stroke Patients Only) Modified Rankin (Stroke Patients Only) Pre-Morbid Rankin Score: Moderate disability Modified Rankin: Moderate disability     Balance Overall balance assessment: Needs assistance Sitting-balance support: Feet supported, No upper extremity supported Sitting balance-Leahy Scale: Fair Sitting balance - Comments: fair seated at EOB   Standing balance support: Reliant on assistive device for balance, Single extremity supported, During functional activity Standing balance-Leahy Scale: Poor Standing balance comment: Reliant on stedy                            Communication Communication Communication: No apparent difficulties  Cognition Arousal: Alert Behavior During Therapy: WFL for tasks assessed/performed, Anxious                             Following commands: Impaired Following commands impaired: Follows multi-step commands with increased time    Cueing Cueing Techniques: Verbal cues, Tactile cues  Exercises      General Comments General comments (skin integrity, edema, etc.): VSS  Pertinent Vitals/Pain Pain Assessment Pain Assessment: No/denies pain    Home Living                          Prior Function            PT Goals (current goals can now be found in the care plan section) Progress towards PT goals: Progressing toward goals    Frequency    Min 4X/week      PT Plan      Co-evaluation PT/OT/SLP Co-Evaluation/Treatment: Yes Reason for Co-Treatment: To address functional/ADL transfers PT goals addressed during session:  Mobility/safety with mobility;Proper use of DME OT goals addressed during session: ADL's and self-care      AM-PAC PT 6 Clicks Mobility   Outcome Measure  Help needed turning from your back to your side while in a flat bed without using bedrails?: A Little Help needed moving from lying on your back to sitting on the side of a flat bed without using bedrails?: A Lot Help needed moving to and from a bed to a chair (including a wheelchair)?: A Lot Help needed standing up from a chair using your arms (e.g., wheelchair or bedside chair)?: A Lot Help needed to walk in hospital room?: A Lot Help needed climbing 3-5 steps with a railing? : Total 6 Click Score: 12    End of Session Equipment Utilized During Treatment: Gait belt Activity Tolerance: Patient tolerated treatment well Patient left: in chair;with call bell/phone within reach;with chair alarm set Nurse Communication: Mobility status PT Visit Diagnosis: Unsteadiness on feet (R26.81);Other abnormalities of gait and mobility (R26.89);Muscle weakness (generalized) (M62.81);Ataxic gait (R26.0)     Time: 8968-8897 PT Time Calculation (min) (ACUTE ONLY): 31 min  Charges:    $Therapeutic Activity: 8-22 mins PT General Charges $$ ACUTE PT VISIT: 1 Visit                     Matthew Wise, PT, DPT Acute Rehab Services [phone]    Matthew Wise 01/01/2025, 1:23 PM

## 2025-01-01 NOTE — Progress Notes (Signed)
 Occupational Therapy Treatment Patient Details Name: Matthew Wise MRN: [id number] DOB: 1961-09-02 Today's Date: 01/01/2025   History of present illness Pt is a 64 y.o male presented to [facility] 1/28 for L sided weakness. In ED appeared to experience seizure like activity lasting 5 minutes. MRI showed  restricted diffusion within the right hippocampus, favor postictal changes & Large remote R MCA territory infarct. Small remote infarcts in the pons and cerebellum. Transferred to [facility] for further neuro eval. PMH includes: CVA, CKD III, DVT, HTN, PE, HLD, seizures   OT comments  Patient supine in bed and agreeable to OT/PT session today.  Pt reports/perseverates on L LE discomfort and needing shoes. Supine PROM to L UE from shoulder to hand, increased flexor tone and poor activation of elbow distal. UB dressing at EOB with mod assist, total assist +2 for LB dressing. Completed bed mobility with min assist, transferred into standing using stedy with max assist +2. Stood in stedy over multiple trials for posterior hygiene with total assist +2. Pt in recliner upon exit.  Ordered resting hand splint after Dr. Kathrin cleared today.  Continue to recommend >3hrs/day inpatient setting at dc. Will follow acutely.       If plan is discharge home, recommend the following:  A lot of help with bathing/dressing/bathroom;Assistance with cooking/housework;Assistance with feeding;Help with stairs or ramp for entrance;Two people to help with walking and/or transfers;Supervision due to cognitive status;Direct supervision/assist for medications management;Direct supervision/assist for financial management;Assist for transportation   Equipment Recommendations  Other (comment) (defer)    Recommendations for Other Services Rehab consult    Precautions / Restrictions Precautions Precautions: Fall Recall of Precautions/Restrictions: Intact Restrictions Weight Bearing Restrictions Per Provider Order: No       Mobility  Bed Mobility Overal bed mobility: Needs Assistance Bed Mobility: Supine to Sit     Supine to sit: Min assist, +2 for safety/equipment     General bed mobility comments: to EOB, cueing for technique and trunk support.    Transfers Overall transfer level: Needs assistance Equipment used: Ambulation equipment used Transfers: Sit to/from Stand, Bed to chair/wheelchair/BSC Sit to Stand: Max assist, +2 physical assistance, Via lift equipment           General transfer comment: stood from EOB pulling on stedy with R UE to power up with max assist +2.  min assist from elevated steady perch mulitple times during hygiene. Transfer via Lift Equipment: Stedy   Balance Overall balance assessment: Needs assistance Sitting-balance support: Feet supported, No upper extremity supported Sitting balance-Leahy Scale: Fair Sitting balance - Comments: fair seated at EOB   Standing balance support: Reliant on assistive device for balance, Single extremity supported, During functional activity Standing balance-Leahy Scale: Poor                             ADL either performed or assessed with clinical judgement   ADL Overall ADL's : Needs assistance/impaired                 Upper Body Dressing : Moderate assistance;Sitting   Lower Body Dressing: +2 for physical assistance;Sit to/from stand;Total assistance   Toilet Transfer: Total assistance;+2 for physical assistance Toilet Transfer Details (indicate cue type and reason): stedy to recliner Toileting- Clothing Manipulation and Hygiene: Total assistance;+2 for physical assistance Toileting - Clothing Manipulation Details (indicate cue type and reason): in stedy, soiled with BM and needing total assist     Functional mobility during  ADLs: Maximal assistance;+2 for physical assistance      Extremity/Trunk Assessment Upper Extremity Assessment Upper Extremity Assessment: LUE deficits/detail LUE Deficits / Details:  tone/tightness in L UE, flexor synergy noted. PROM/stretch into elbow extension and supination, some AROM at shoulder/elbow but limited distally. reached out to MD about resting hand splint and ordered 2/2 LUE Sensation: decreased light touch;decreased proprioception LUE Coordination: decreased fine motor;decreased gross motor            Vision   Additional Comments: continue assessment   Perception     Praxis     Communication Communication Communication: No apparent difficulties   Cognition Arousal: Alert Behavior During Therapy: WFL for tasks assessed/performed, Anxious Cognition: Cognition impaired     Awareness: Online awareness impaired Memory impairment (select all impairments): Short-term memory, Working memory Attention impairment (select first level of impairment): Sustained attention Executive functioning impairment (select all impairments): Problem solving, Reasoning, Initiation, Organization, Sequencing OT - Cognition Comments: pt with decreased recall during session, poor problem sovling and mental flexibility.  He requires cueing for task attention and safety.  perseverates on needing his shoes.                 Following commands: Impaired Following commands impaired: Follows one step commands with increased time, Follows multi-step commands inconsistently      Cueing   Cueing Techniques: Verbal cues, Tactile cues  Exercises      Shoulder Instructions       General Comments      Pertinent Vitals/ Pain       Pain Assessment Pain Assessment: No/denies pain  Home Living                                          Prior Functioning/Environment              Frequency  Min 3X/week        Progress Toward Goals  OT Goals(current goals can now be found in the care plan section)  Progress towards OT goals: Progressing toward goals  Acute Rehab OT Goals Patient Stated Goal: get better OT Goal Formulation: With  patient Time For Goal Achievement: 01/11/25 Potential to Achieve Goals: Good  Plan      Co-evaluation    PT/OT/SLP Co-Evaluation/Treatment: Yes Reason for Co-Treatment: To address functional/ADL transfers PT goals addressed during session: Mobility/safety with mobility;Proper use of DME OT goals addressed during session: ADL's and self-care      AM-PAC OT 6 Clicks Daily Activity     Outcome Measure   Help from another person eating meals?: A Little Help from another person taking care of personal grooming?: A Little Help from another person toileting, which includes using toliet, bedpan, or urinal?: Total Help from another person bathing (including washing, rinsing, drying)?: A Lot Help from another person to put on and taking off regular upper body clothing?: A Lot Help from another person to put on and taking off regular lower body clothing?: Total 6 Click Score: 12    End of Session    OT Visit Diagnosis: Unsteadiness on feet (R26.81);Other abnormalities of gait and mobility (R26.89);Muscle weakness (generalized) (M62.81);Other symptoms and signs involving the nervous system (R29.898);Hemiplegia and hemiparesis Hemiplegia - Right/Left: Left Hemiplegia - dominant/non-dominant: Non-Dominant Hemiplegia - caused by: Cerebral infarction   Activity Tolerance Patient tolerated treatment well   Patient Left in chair;with call bell/phone within reach;with chair  alarm set   Nurse Communication Mobility status;Precautions        Time: [phone] OT Time Calculation (min): 28 min  Charges: OT General Charges $OT Visit: 1 Visit OT Treatments $Self Care/Home Management : 8-22 mins  Etta NOVAK, OT Acute Rehabilitation Services Office [phone] Secure Chat Preferred    Etta GORMAN Hope 01/01/2025, 1:20 PM

## 2025-01-01 NOTE — Plan of Care (Signed)
" °  Problem: Education: Goal: Knowledge of General Education information will improve Description: Including pain rating scale, medication(s)/side effects and non-pharmacologic comfort measures Outcome: Progressing   Problem: Health Behavior/Discharge Planning: Goal: Ability to manage health-related needs will improve Outcome: Progressing   Problem: Clinical Measurements: Goal: Ability to maintain clinical measurements within normal limits will improve Outcome: Progressing Goal: Will remain free from infection Outcome: Progressing Goal: Diagnostic test results will improve Outcome: Progressing Goal: Respiratory complications will improve Outcome: Progressing Goal: Cardiovascular complication will be avoided Outcome: Progressing   Problem: Activity: Goal: Risk for activity intolerance will decrease Outcome: Progressing   Problem: Nutrition: Goal: Adequate nutrition will be maintained Outcome: Progressing   Problem: Coping: Goal: Level of anxiety will decrease Outcome: Progressing   Problem: Elimination: Goal: Will not experience complications related to bowel motility Outcome: Progressing Goal: Will not experience complications related to urinary retention Outcome: Progressing   Problem: Pain Managment: Goal: General experience of comfort will improve and/or be controlled Outcome: Progressing   Problem: Safety: Goal: Ability to remain free from injury will improve Outcome: Progressing   Problem: Skin Integrity: Goal: Risk for impaired skin integrity will decrease Outcome: Progressing   Problem: Education: Goal: Knowledge of disease or condition will improve Outcome: Progressing Goal: Knowledge of secondary prevention will improve (MUST DOCUMENT ALL) Outcome: Progressing Goal: Knowledge of patient specific risk factors will improve (DELETE if not current risk factor) Outcome: Progressing   Problem: Ischemic Stroke/TIA Tissue Perfusion: Goal: Complications of  ischemic stroke/TIA will be minimized Outcome: Progressing   Problem: Coping: Goal: Will verbalize positive feelings about self Outcome: Progressing Goal: Will identify appropriate support needs Outcome: Progressing   Problem: Health Behavior/Discharge Planning: Goal: Ability to manage health-related needs will improve Outcome: Progressing Goal: Goals will be collaboratively established with patient/family Outcome: Progressing   Problem: Self-Care: Goal: Ability to participate in self-care as condition permits will improve Outcome: Progressing Goal: Verbalization of feelings and concerns over difficulty with self-care will improve Outcome: Progressing Goal: Ability to communicate needs accurately will improve Outcome: Progressing   Problem: Nutrition: Goal: Risk of aspiration will decrease Outcome: Progressing Goal: Dietary intake will improve Outcome: Progressing   Problem: Education: Goal: Expressions of having a comfortable level of knowledge regarding the disease process will increase Outcome: Progressing   Problem: Coping: Goal: Ability to adjust to condition or change in health will improve Outcome: Progressing Goal: Ability to identify appropriate support needs will improve Outcome: Progressing   Problem: Health Behavior/Discharge Planning: Goal: Compliance with prescribed medication regimen will improve Outcome: Progressing   Problem: Medication: Goal: Risk for medication side effects will decrease Outcome: Progressing   Problem: Clinical Measurements: Goal: Complications related to the disease process, condition or treatment will be avoided or minimized Outcome: Progressing Goal: Diagnostic test results will improve Outcome: Progressing   Problem: Safety: Goal: Verbalization of understanding the information provided will improve Outcome: Progressing   Problem: Self-Concept: Goal: Level of anxiety will decrease Outcome: Progressing Goal: Ability to  verbalize feelings about condition will improve Outcome: Progressing   "

## 2025-01-01 NOTE — NC FL2 (Signed)
 " [place]  MEDICAID FL2 LEVEL OF CARE FORM     IDENTIFICATION  Patient Name: Matthew Wise Birthdate: Feb 20, 1961 Sex: adult Admission Date (Current Location): 12/27/2024  [facility] and [place] Number:  [employer] and Address:  [facility], [address] [place], [place] [address]      Provider Number: [id number]  Attending Physician Name and Address:  Kathrin Mignon DASEN, MD  Relative Name and Phone Number:       Current Level of Care: Hospital Recommended Level of Care: Skilled Nursing Facility Prior Approval Number:    Date Approved/Denied:   PASRR Number: [id number]  Discharge Plan: SNF    Current Diagnoses: Patient Active Problem List   Diagnosis Date Noted   Seizure (HCC) 12/28/2024   Leg swelling 07/18/2024   Postoperative ileus (HCC) 10/13/2023   Acute pancreatitis 10/08/2023   Skin abscess 10/01/2023   Acute non-recurrent sinusitis 01/15/2023   Gastroesophageal reflux disease 01/15/2023   Seizure with onset of stroke (HCC) 01/15/2023   Encounter for general adult medical examination with abnormal findings 07/06/2022   Stage 3a chronic kidney disease (HCC) 07/06/2022   Hospital discharge follow-up 04/30/2022   Obesity, Class I, BMI 30-34.9 04/25/2022   Generalized weakness    Near syncope    Non-seasonal allergic rhinitis 03/25/2022   Encounter for examination following treatment at hospital 02/27/2022   BPH (benign prostatic hyperplasia) 09/24/2021   History of CVA with residual deficit 09/24/2021   Dysphagia, post-stroke    Spastic hemiplegia affecting nondominant side (HCC)    Hemiparesis affecting left side as late effect of stroke (HCC)    Thyroid  nodule 01/25/2021   Hypothyroidism 01/25/2021   H/O adenomatous polyp of colon 12/30/2020   Essential hypertension 11/28/2020   Mixed hyperlipidemia 11/28/2020   H/O medication noncompliance 09/07/2020   Nicotine  dependence 12/05/2019   Prediabetes 10/24/2019    Tubular adenoma of colon 09/14/2017   Long term (current) use of anticoagulants 11/14/2014   Sickle cell trait 08/29/2012   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    Orientation RESPIRATION BLADDER Height & Weight     Self, Time, Situation, Place  Normal Incontinent Weight: 282 lb 6.6 oz (128.1 kg) Height:  6' (182.9 cm)  BEHAVIORAL SYMPTOMS/MOOD NEUROLOGICAL BOWEL NUTRITION STATUS    Convulsions/Seizures Continent Diet (regular)  AMBULATORY STATUS COMMUNICATION OF NEEDS Skin   Extensive Assist Verbally Normal                       Personal Care Assistance Level of Assistance  Bathing, Feeding, Dressing Bathing Assistance: Maximum assistance Feeding assistance: Limited assistance Dressing Assistance: Maximum assistance     Functional Limitations Info  Sight, Hearing Sight Info: Impaired Hearing Info: Impaired      SPECIAL CARE FACTORS FREQUENCY  PT (By licensed PT), OT (By licensed OT)     PT Frequency: 5x/wk OT Frequency: 5x/wk            Contractures Contractures Info: Not present    Additional Factors Info  Code Status, Allergies Code Status Info: Full Allergies Info: NKA           Current Medications (01/01/2025):  This is the current hospital active medication list Current Facility-Administered Medications  Medication Dose Route Frequency Provider Last Rate Last Admin   acetaminophen  (TYLENOL ) tablet 650 mg  650 mg Oral Q6H PRN Shona Laurence N, DO       atorvastatin  (LIPITOR) tablet 40 mg  40 mg Oral  Daily Gonfa, Taye T, MD   40 mg at 01/01/25 9188   Chlorhexidine  Gluconate Cloth 2 % PADS 6 each  6 each Topical Q0600 Shona Terry SAILOR, DO   6 each at 01/01/25 0559   dabigatran  (PRADAXA ) capsule 150 mg  150 mg Oral Q12H [address], DO   150 mg at 01/01/25 9188   labetalol  (NORMODYNE ) injection 10 mg  10 mg Intravenous Q2H PRN Shona Terry SAILOR, DO       levETIRAcetam  (KEPPRA ) undiluted injection 1,000 mg  1,000 mg Intravenous BID Shona Terry N, DO   1,000 mg at 01/01/25 9188   levothyroxine  (SYNTHROID ) tablet 50 mcg  50 mcg Oral QAC breakfast Shona Terry SAILOR, DO   50 mcg at 01/01/25 9447   LORazepam  (ATIVAN ) injection 1-2 mg  1-2 mg Intravenous Q4H PRN Shona Terry N, DO       melatonin tablet 6 mg  6 mg Oral QHS PRN Shona Terry N, DO   6 mg at 12/28/24 2225   pantoprazole  (PROTONIX ) EC tablet 40 mg  40 mg Oral Daily Shona Terry SAILOR, DO   40 mg at 01/01/25 9188   polyethylene glycol (MIRALAX  / GLYCOLAX ) packet 17 g  17 g Oral Daily PRN Shona Terry N, DO       prochlorperazine  (COMPAZINE ) injection 5 mg  5 mg Intravenous Q6H PRN Shona Terry N, DO       tamsulosin  (FLOMAX ) capsule 0.4 mg  0.4 mg Oral Daily Maranda Lonni MATSU, MD   0.4 mg at 01/01/25 9188     Discharge Medications: Please see discharge summary for a list of discharge medications.  Relevant Imaging Results:  Relevant Lab Results:   Additional Information SS#: [id number]  Almarie CHRISTELLA Goodie, LCSW     "

## 2025-01-02 MED ORDER — ACETAMINOPHEN 325 MG PO TABS: 650.0000 mg | ORAL_TABLET | Freq: Four times a day (QID) | ORAL | Status: AC | PRN

## 2025-01-02 MED ORDER — MELATONIN 3 MG PO TABS: 6.0000 mg | ORAL_TABLET | Freq: Every evening | ORAL | Status: AC | PRN

## 2025-01-02 MED ORDER — LEVETIRACETAM 500 MG PO TABS
1000.0000 mg | ORAL_TABLET | Freq: Two times a day (BID) | ORAL | Status: DC
  Administered 2025-01-02 – 2025-01-03 (×3): 1000 mg via ORAL
  Filled 2025-01-02 (×3): qty 2

## 2025-01-02 MED ORDER — LEVETIRACETAM 1000 MG PO TABS: 1000.0000 mg | ORAL_TABLET | Freq: Two times a day (BID) | ORAL | Status: DC

## 2025-01-02 NOTE — Plan of Care (Signed)
 Communicated well. No complaints from his side.   Problem: Education: Goal: Knowledge of General Education information will improve Description: Including pain rating scale, medication(s)/side effects and non-pharmacologic comfort measures Outcome: Progressing   Problem: Clinical Measurements: Goal: Will remain free from infection Outcome: Progressing   Problem: Skin Integrity: Goal: Risk for impaired skin integrity will decrease Outcome: Progressing   Problem: Health Behavior/Discharge Planning: Goal: Ability to manage health-related needs will improve Outcome: Progressing   Problem: Safety: Goal: Verbalization of understanding the information provided will improve Outcome: Progressing

## 2025-01-02 NOTE — Progress Notes (Signed)
 Occupational Therapy Treatment Patient Details Name: Matthew Wise MRN: [id number] DOB: [birth year] Today's Date: 01/02/2025   History of present illness Matthew Wise is a 64 y.o male presented to [facility] 1/28 for L sided weakness. In ED appeared to experience seizure like activity lasting 5 minutes. MRI showed  restricted diffusion within the right hippocampus, favor postictal changes & Large remote R MCA territory infarct. Small remote infarcts in the pons and cerebellum. Transferred to [facility] for further neuro eval. PMH includes: CVA, CKD III, DVT, HTN, PE, HLD, seizures   OT comments  Patient wearing splint, returned for check. Fits well.  Educated Matthew Wise and Matthew Wise on schedule for overnight.  Placed sign in room and updated schedule in orders.  Noted decreased elbow tone compared to this morning.  Will follow acutely.       If plan is discharge home, recommend the following:  A lot of help with bathing/dressing/bathroom;Assistance with cooking/housework;Assistance with feeding;Help with stairs or ramp for entrance;Two people to help with walking and/or transfers;Supervision due to cognitive status;Direct supervision/assist for medications management;Direct supervision/assist for financial management;Assist for transportation   Equipment Recommendations  Other (comment) (defer)    Recommendations for Other Services      Precautions / Restrictions Precautions Precautions: Fall Recall of Precautions/Restrictions: Intact Restrictions Weight Bearing Restrictions Per Provider Order: No       Mobility Bed Mobility                    Transfers                         Balance                                           ADL either performed or assessed with clinical judgement   ADL   Eating/Feeding: Set up;Sitting Eating/Feeding Details (indicate cue type and reason): upright in bed                                        Extremity/Trunk Assessment  Upper Extremity Assessment Upper Extremity Assessment: LUE deficits/detail LUE Deficits / Details: tone/tightness in L UE, flexor synergy noted. PROM/stretch into elbow extension and supination, some AROM at shoulder/elbow but limited distally. LUE Sensation: decreased light touch;decreased proprioception LUE Coordination: decreased fine motor;decreased gross motor            Vision       Perception     Praxis     Communication Communication Communication: No apparent difficulties   Cognition Arousal: Alert Behavior During Therapy: WFL for tasks assessed/performed, Anxious Cognition: Cognition impaired             OT - Cognition Comments: not formally assessed today                 Following commands: Impaired        Cueing   Cueing Techniques: Verbal cues, Tactile cues  Exercises      Shoulder Instructions       General Comments Matthew Wise tolerated splint well.  No areas of redenss or irritation.  Educated Matthew Wise and RN on wear schedule, updated splint schedule in chart/placed sign above head of bed for overnight wear.    Pertinent Vitals/ Pain  Pain Assessment Pain Assessment: Faces Faces Pain Scale: No hurt Pain Location: L UE with elbow extension stretch Pain Descriptors / Indicators: Discomfort Pain Intervention(s): Monitored during session  Home Living                                          Prior Functioning/Environment              Frequency  Min 3X/week        Progress Toward Goals  OT Goals(current goals can now be found in the care plan section)  Progress towards OT goals: Progressing toward goals  Acute Rehab OT Goals Patient Stated Goal: get better OT Goal Formulation: With patient Time For Goal Achievement: 01/11/25 Potential to Achieve Goals: Good  Plan      Co-evaluation                 AM-PAC OT 6 Clicks Daily Activity     Outcome Measure   Help from another person eating meals?: A  Little Help from another person taking care of personal grooming?: A Little Help from another person toileting, which includes using toliet, bedpan, or urinal?: Total Help from another person bathing (including washing, rinsing, drying)?: A Lot Help from another person to put on and taking off regular upper body clothing?: A Lot Help from another person to put on and taking off regular lower body clothing?: Total 6 Click Score: 12    End of Session    OT Visit Diagnosis: Unsteadiness on feet (R26.81);Other abnormalities of gait and mobility (R26.89);Muscle weakness (generalized) (M62.81);Other symptoms and signs involving the nervous system (R29.898);Hemiplegia and hemiparesis Hemiplegia - Right/Left: Left Hemiplegia - dominant/non-dominant: Non-Dominant Hemiplegia - caused by: Cerebral infarction   Activity Tolerance Patient tolerated treatment well   Patient Left with call bell/phone within reach;in bed;with bed alarm set   Nurse Communication Mobility status        Time: 8844-8790 OT Time Calculation (min): 14 min  Charges: OT General Charges $OT Visit: 1 Visit OT Treatments $Neuromuscular Re-education: 8-22 mins $Orthotics/Prosthetics Check: 8-22 mins  Etta NOVAK, OT Acute Rehabilitation Services Office [phone] Secure Chat Preferred    Etta GORMAN Hope 01/02/2025, 1:55 PM

## 2025-01-02 NOTE — TOC Progression Note (Signed)
 Transition of Care [facility]) - Progression Note    Patient Details  Name: [facility] MRN: [id number] Date of Birth: [birth year]  Transition of Care [facility]) CM/SW Contact  [name], RN Phone Number: 01/02/2025, 3:40 PM  Clinical Narrative:     Pt only has bed offer from [facility] in [facility]. Pt declines to go to this [facility] as he has been there before. [facility] has declined.  [facility]waiting [facility] to decide if can offer a bed.  Pt stating he may not want [facility]. Pt gave permission for ICM to speak to his SO, [facility]. She says she will need him to be able to ambulate some to assist him. She will talk things over with him this pm and ICM will follow up with patient in the am.    Expected Discharge Plan: Skilled Nursing Facility Barriers to Discharge: Continued Medical Work up               Expected Discharge Plan and Services In-house Referral: Clinical Social Work   Post Acute Care Choice: Skilled Nursing Facility Living arrangements for the past 2 months: Single Family Home                                       Social Drivers of Health (SDOH) Interventions SDOH Screenings   Food Insecurity: No Food Insecurity (12/28/2024)  Housing: Low Risk (12/28/2024)  Transportation Needs: No Transportation Needs (12/28/2024)  Utilities: Not At Risk (12/28/2024)  Alcohol Screen: Low Risk (12/21/2023)  Depression (PHQ2-9): Low Risk (12/26/2024)  Financial Resource Strain: Low Risk (12/21/2023)  Physical Activity: Insufficiently Active (12/26/2024)  Social Connections: Socially Isolated (12/26/2024)  Stress: No Stress Concern Present (12/26/2024)  Tobacco Use: Medium Risk (12/27/2024)  Health Literacy: Adequate Health Literacy (12/26/2024)    Readmission Risk Interventions    12/28/2024   10:53 AM  Readmission Risk Prevention Plan  Medication Screening Complete  Transportation Screening Complete

## 2025-01-03 ENCOUNTER — Other Ambulatory Visit (HOSPITAL_COMMUNITY): Payer: Self-pay

## 2025-01-03 MED ORDER — TAMSULOSIN HCL 0.4 MG PO CAPS
0.4000 mg | ORAL_CAPSULE | Freq: Every day | ORAL | 0 refills | Status: AC
  Filled 2025-01-03: qty 90, 90d supply, fill #0

## 2025-01-03 MED ORDER — LEVETIRACETAM 1000 MG PO TABS
1000.0000 mg | ORAL_TABLET | Freq: Two times a day (BID) | ORAL | 0 refills | Status: AC
  Filled 2025-01-03: qty 60, 30d supply, fill #0

## 2025-01-03 NOTE — Plan of Care (Addendum)
 AVS instructions provided. Patient verbalized understanding. Waiting for his ride, his nephew is coming to pick him up.  Problem: Self-Concept: Goal: Ability to verbalize feelings about condition will improve Outcome: Progressing    Problem: Safety: Goal: Verbalization of understanding the information provided will improve Outcome: Progressing   Problem: Health Behavior/Discharge Planning: Goal: Ability to manage health-related needs will improve Outcome: Progressing   Problem: Clinical Measurements: Goal: Will remain free from infection Outcome: Progressing

## 2025-01-03 NOTE — Progress Notes (Signed)
 Physical Therapy Treatment Patient Details Name: [facility] MRN: [id number] DOB: 1960/12/13 Today's Date: 01/03/2025   History of Present Illness Pt is a 64 y.o male presented to [facility] 1/28 for L sided weakness. In ED appeared to experience seizure like activity lasting 5 minutes. MRI showed  restricted diffusion within the right hippocampus, favor postictal changes & Large remote R MCA territory infarct. Small remote infarcts in the pons and cerebellum. Transferred to [facility] for further neuro eval. PMH includes: CVA, CKD III, DVT, HTN, PE, HLD, seizures    PT Comments  Pt tolerated treatment well today. Pt getting ready for DC and assisted with getting dressed. Practiced step pivot transfers with quad cane today as pt has now declined SNF and elected for HHPT. Pt overall Min/Mod A with transfers. DC recs updated to HHPT. Pt left in transport chair with nursing for DC.    If plan is discharge home, recommend the following: A lot of help with bathing/dressing/bathroom;A lot of help with walking and/or transfers;Help with stairs or ramp for entrance;Assist for transportation;Assistance with cooking/housework   Can travel by Doctor, Hospital (measurements PT);Wheelchair cushion (measurements PT);Hospital bed    Recommendations for Other Services       Precautions / Restrictions Precautions Precautions: Fall Recall of Precautions/Restrictions: Intact Restrictions Weight Bearing Restrictions Per Provider Order: No     Mobility  Bed Mobility Overal bed mobility: Needs Assistance Bed Mobility: Supine to Sit     Supine to sit: Contact guard     General bed mobility comments: Increased time and reliant on momentum.    Transfers Overall transfer level: Needs assistance Equipment used: Quad cane Transfers: Sit to/from Stand, Bed to chair/wheelchair/BSC Sit to Stand: Min assist, Mod assist   Step pivot transfers: Min assist, Mod assist        General transfer comment: Min A to stand from bed with heavy reliance on momentum. Mod A to stand from [facility]. Min/Mod to transfer. Transferred into WC at end of session for DC.    Ambulation/Gait               General Gait Details: Deferred for safety   Stairs             Wheelchair Mobility     Tilt Bed    Modified Rankin (Stroke Patients Only) Modified Rankin (Stroke Patients Only) Pre-Morbid Rankin Score: Moderate disability Modified Rankin: Moderate disability     Balance Overall balance assessment: Needs assistance Sitting-balance support: Feet supported, No upper extremity supported Sitting balance-Leahy Scale: Fair Sitting balance - Comments: fair seated at EOB   Standing balance support: Reliant on assistive device for balance, Single extremity supported, During functional activity Standing balance-Leahy Scale: Poor Standing balance comment: Reliant on quad cane                            Communication Communication Communication: No apparent difficulties  Cognition Arousal: Alert Behavior During Therapy: WFL for tasks assessed/performed, Anxious   PT - Cognitive impairments: No apparent impairments                         Following commands: Impaired Following commands impaired: Follows multi-step commands with increased time    Cueing Cueing Techniques: Verbal cues, Tactile cues  Exercises      General Comments General comments (skin integrity, edema, etc.): VSS. Assisted pt  with getting dressed for DC.      Pertinent Vitals/Pain Pain Assessment Pain Assessment: Faces Faces Pain Scale: No hurt    Home Living                          Prior Function            PT Goals (current goals can now be found in the care plan section) Progress towards PT goals: Progressing toward goals    Frequency    Min 4X/week      PT Plan      Co-evaluation              AM-PAC PT 6 Clicks  Mobility   Outcome Measure  Help needed turning from your back to your side while in a flat bed without using bedrails?: A Little Help needed moving from lying on your back to sitting on the side of a flat bed without using bedrails?: A Lot Help needed moving to and from a bed to a chair (including a wheelchair)?: A Lot Help needed standing up from a chair using your arms (e.g., wheelchair or bedside chair)?: A Lot Help needed to walk in hospital room?: A Lot Help needed climbing 3-5 steps with a railing? : Total 6 Click Score: 12    End of Session Equipment Utilized During Treatment: Gait belt Activity Tolerance: Patient tolerated treatment well Patient left: Other (comment);with nursing/sitter in room (In [facility] with RN for DC) Nurse Communication: Mobility status PT Visit Diagnosis: Unsteadiness on feet (R26.81);Other abnormalities of gait and mobility (R26.89);Muscle weakness (generalized) (M62.81);Ataxic gait (R26.0)     Time: 8853-8779 PT Time Calculation (min) (ACUTE ONLY): 34 min  Charges:    $Therapeutic Activity: 23-37 mins PT General Charges $$ ACUTE PT VISIT: 1 Visit                     [facility], PT, DPT Acute Rehab Services [phone]    [facility] 01/03/2025, 12:33 PM

## 2025-01-03 NOTE — Plan of Care (Signed)
 " Problem: Education: Goal: Knowledge of General Education information will improve Description: Including pain rating scale, medication(s)/side effects and non-pharmacologic comfort measures 01/03/2025 1207 by Kenden Brandt, RN Outcome: Adequate for Discharge 01/03/2025 1143 by Abigaile Rossie, RN Outcome: Progressing   Problem: Health Behavior/Discharge Planning: Goal: Ability to manage health-related needs will improve 01/03/2025 1207 by Lucresia Simic, RN Outcome: Adequate for Discharge 01/03/2025 1143 by Raenelle Brownie, RN Outcome: Progressing   Problem: Clinical Measurements: Goal: Ability to maintain clinical measurements within normal limits will improve 01/03/2025 1207 by Seleena Reimers, RN Outcome: Adequate for Discharge 01/03/2025 1143 by Raenelle Brownie, RN Outcome: Progressing Goal: Will remain free from infection 01/03/2025 1207 by Doylene Splinter, RN Outcome: Adequate for Discharge 01/03/2025 1143 by Samba Cumba, RN Outcome: Progressing Goal: Diagnostic test results will improve 01/03/2025 1207 by Bayard More, RN Outcome: Adequate for Discharge 01/03/2025 1143 by Smaran Gaus, RN Outcome: Progressing Goal: Respiratory complications will improve 01/03/2025 1207 by Davante Gerke, RN Outcome: Adequate for Discharge 01/03/2025 1143 by Chief Walkup, RN Outcome: Progressing Goal: Cardiovascular complication will be avoided 01/03/2025 1207 by Cynia Abruzzo, RN Outcome: Adequate for Discharge 01/03/2025 1143 by Race Latour, RN Outcome: Progressing   Problem: Activity: Goal: Risk for activity intolerance will decrease 01/03/2025 1207 by Carless Slatten, RN Outcome: Adequate for Discharge 01/03/2025 1143 by Raenelle Brownie, RN Outcome: Progressing   Problem: Nutrition: Goal: Adequate nutrition will be maintained 01/03/2025 1207 by Rosalin Buster, RN Outcome: Adequate for Discharge 01/03/2025 1143 by Raenelle Brownie, RN Outcome: Progressing   Problem:  Coping: Goal: Level of anxiety will decrease 01/03/2025 1207 by Vani Gunner, RN Outcome: Adequate for Discharge 01/03/2025 1143 by Cassity Christian, RN Outcome: Progressing   Problem: Elimination: Goal: Will not experience complications related to bowel motility 01/03/2025 1207 by Yacine Garriga, RN Outcome: Adequate for Discharge 01/03/2025 1143 by Jessi Pitstick, RN Outcome: Progressing Goal: Will not experience complications related to urinary retention 01/03/2025 1207 by Heaven Wandell, RN Outcome: Adequate for Discharge 01/03/2025 1143 by Bubba Vanbenschoten, RN Outcome: Progressing   Problem: Pain Managment: Goal: General experience of comfort will improve and/or be controlled 01/03/2025 1207 by Marky Buresh, RN Outcome: Adequate for Discharge 01/03/2025 1143 by Raenelle Brownie, RN Outcome: Progressing   Problem: Safety: Goal: Ability to remain free from injury will improve 01/03/2025 1207 by Donzella Carrol, RN Outcome: Adequate for Discharge 01/03/2025 1143 by Raenelle Brownie, RN Outcome: Progressing   Problem: Skin Integrity: Goal: Risk for impaired skin integrity will decrease 01/03/2025 1207 by Raenelle Brownie, RN Outcome: Adequate for Discharge 01/03/2025 1143 by Raenelle Brownie, RN Outcome: Progressing   Problem: Education: Goal: Knowledge of disease or condition will improve 01/03/2025 1207 by Braelynne Garinger, RN Outcome: Adequate for Discharge 01/03/2025 1143 by Jari Dipasquale, RN Outcome: Progressing Goal: Knowledge of secondary prevention will improve (MUST DOCUMENT ALL) 01/03/2025 1207 by Remmy Crass, RN Outcome: Adequate for Discharge 01/03/2025 1143 by Sheffield Hawker, RN Outcome: Progressing Goal: Knowledge of patient specific risk factors will improve (DELETE if not current risk factor) 01/03/2025 1207 by Floyce Bujak, RN Outcome: Adequate for Discharge 01/03/2025 1143 by Medora Roorda, RN Outcome: Progressing   Problem: Ischemic Stroke/TIA Tissue  Perfusion: Goal: Complications of ischemic stroke/TIA will be minimized 01/03/2025 1207 by Shaquna Geigle, RN Outcome: Adequate for Discharge 01/03/2025 1143 by Clement Deneault, RN Outcome: Progressing   Problem: Coping: Goal: Will verbalize positive feelings about self 01/03/2025 1207 by Mehul Rudin, RN Outcome: Adequate for Discharge 01/03/2025 1143 by Ebbie Cherry, RN Outcome: Progressing Goal: Will identify appropriate support needs  01/03/2025 1207 by Maribelle Hopple, RN Outcome: Adequate for Discharge 01/03/2025 1143 by Brandilyn Nanninga, RN Outcome: Progressing   Problem: Health Behavior/Discharge Planning: Goal: Ability to manage health-related needs will improve 01/03/2025 1207 by Odessie Polzin, RN Outcome: Adequate for Discharge 01/03/2025 1143 by Carey Lafon, RN Outcome: Progressing Goal: Goals will be collaboratively established with patient/family 01/03/2025 1207 by Shalev Helminiak, RN Outcome: Adequate for Discharge 01/03/2025 1143 by Raenelle Brownie, RN Outcome: Progressing   Problem: Self-Care: Goal: Ability to participate in self-care as condition permits will improve 01/03/2025 1207 by Ave Scharnhorst, RN Outcome: Adequate for Discharge 01/03/2025 1143 by Song Myre, RN Outcome: Progressing Goal: Verbalization of feelings and concerns over difficulty with self-care will improve 01/03/2025 1207 by Narek Kniss, RN Outcome: Adequate for Discharge 01/03/2025 1143 by Rihanna Marseille, RN Outcome: Progressing Goal: Ability to communicate needs accurately will improve 01/03/2025 1207 by Jahni Nazar, RN Outcome: Adequate for Discharge 01/03/2025 1143 by Sahvannah Rieser, RN Outcome: Progressing   Problem: Nutrition: Goal: Risk of aspiration will decrease 01/03/2025 1207 by Lahoma Constantin, RN Outcome: Adequate for Discharge 01/03/2025 1143 by Dauntae Derusha, RN Outcome: Progressing Goal: Dietary intake will improve 01/03/2025 1207 by Chaim Gatley, RN Outcome:  Adequate for Discharge 01/03/2025 1143 by Mehgan Santmyer, RN Outcome: Progressing   Problem: Education: Goal: Expressions of having a comfortable level of knowledge regarding the disease process will increase 01/03/2025 1207 by Nyomi Howser, RN Outcome: Adequate for Discharge 01/03/2025 1143 by Nysa Sarin, RN Outcome: Progressing   Problem: Coping: Goal: Ability to adjust to condition or change in health will improve 01/03/2025 1207 by Shalayne Leach, RN Outcome: Adequate for Discharge 01/03/2025 1143 by Mariachristina Holle, RN Outcome: Progressing Goal: Ability to identify appropriate support needs will improve 01/03/2025 1207 by Madeeha Costantino, RN Outcome: Adequate for Discharge 01/03/2025 1143 by Raenelle Brownie, RN Outcome: Progressing   Problem: Health Behavior/Discharge Planning: Goal: Compliance with prescribed medication regimen will improve 01/03/2025 1207 by Angelea Penny, RN Outcome: Adequate for Discharge 01/03/2025 1143 by Teddrick Mallari, RN Outcome: Progressing   Problem: Medication: Goal: Risk for medication side effects will decrease 01/03/2025 1207 by Trinton Prewitt, RN Outcome: Adequate for Discharge 01/03/2025 1143 by Lianette Broussard, RN Outcome: Progressing   Problem: Clinical Measurements: Goal: Complications related to the disease process, condition or treatment will be avoided or minimized 01/03/2025 1207 by Afrika Brick, RN Outcome: Adequate for Discharge 01/03/2025 1143 by Lori-Ann Lindfors, RN Outcome: Progressing Goal: Diagnostic test results will improve 01/03/2025 1207 by Stacye Noori, RN Outcome: Adequate for Discharge 01/03/2025 1143 by Clora Ohmer, RN Outcome: Progressing   Problem: Safety: Goal: Verbalization of understanding the information provided will improve 01/03/2025 1207 by Adaleen Hulgan, RN Outcome: Adequate for Discharge 01/03/2025 1143 by Martrice Apt, RN Outcome: Progressing   Problem: Self-Concept: Goal: Level of anxiety  will decrease 01/03/2025 1207 by Rhen Dossantos, RN Outcome: Adequate for Discharge 01/03/2025 1143 by Jocilynn Grade, RN Outcome: Progressing Goal: Ability to verbalize feelings about condition will improve 01/03/2025 1207 by Ulices Maack, RN Outcome: Adequate for Discharge 01/03/2025 1143 by Jeanclaude Wentworth, RN Outcome: Progressing   "

## 2025-01-03 NOTE — Progress Notes (Signed)
 Patient discharged, dropped to main entrance A with SO.

## 2025-01-03 NOTE — TOC Transition Note (Signed)
 Transition of Care [facility]) - Discharge Note   Patient Details  Name: [facility] MRN: [id number] Date of Birth: 30-Oct-1961  [facility]) CM/SW Contact:  [name], RN Phone Number: 01/03/2025, 10:49 AM   Clinical Narrative:     Pt has decided to discharge home with home health services. [facility] offered and pt in agreement. Information on the AVS. Pt has needed DME at home.  ICM talked to his SO, [name] and she is willing to assist him at home as needed.  Pt states his nephew will provide transportation home.   Final next level of care: Home w Home Health Services Barriers to Discharge: No Barriers Identified   Patient Goals and CMS Choice Patient states their goals for this hospitalization and ongoing recovery are:: get well CMS Medicare.gov Compare Post Acute Care list provided to:: Patient Choice offered to / list presented to : Patient      Discharge Placement                       Discharge Plan and Services Additional resources added to the After Visit Summary for   In-house Referral: Clinical Social Work   Post Acute Care Choice: Skilled Nursing Facility                    HH Arranged: PT, OT          Social Drivers of Health (SDOH) Interventions SDOH Screenings   Food Insecurity: No Food Insecurity (12/28/2024)  Housing: Low Risk (12/28/2024)  Transportation Needs: No Transportation Needs (12/28/2024)  Utilities: Not At Risk (12/28/2024)  Alcohol Screen: Low Risk (12/21/2023)  Depression (PHQ2-9): Low Risk (12/26/2024)  Financial Resource Strain: Low Risk (12/21/2023)  Physical Activity: Insufficiently Active (12/26/2024)  Social Connections: Socially Isolated (12/26/2024)  Stress: No Stress Concern Present (12/26/2024)  Tobacco Use: Medium Risk (12/27/2024)  Health Literacy: Adequate Health Literacy (12/26/2024)     Readmission Risk Interventions    12/28/2024   10:53 AM  Readmission Risk Prevention Plan  Medication Screening  Complete  Transportation Screening Complete

## 2025-01-03 NOTE — Discharge Summary (Signed)
 "  Physician Discharge Summary  Matthew Wise FMW:[id number] DOB: 09-06-1961 DOA: 12/27/2024  PCP: Tobie Suzzane POUR, MD  Admit date: 12/27/2024 Discharge date: 01/03/25  Admitted From: Home Disposition: Home Recommendations for Outpatient Follow-up:  Outpatient follow-up with PCP in 1 to 2 weeks Outpatient follow-up with neurology in 2 to 3 weeks Check CMP and CBC at follow-up Please follow up on the following pending results: None   Home Health: HHPT/OT Equipment/Devices: Patient has appropriate DMEs  Discharge Condition: Stable CODE STATUS: Full code   Contact information for follow-up providers     Tobie Suzzane POUR, MD. Schedule an appointment as soon as possible for a visit in 1 week(s).   Specialty: Internal Medicine Contact information: [address] [place] [address] [phone]              Contact information for after-discharge care     Home Medical Care     [facility]) .   Service: Home Health Services Contact information: 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    In ED, the patient was seen by teleneurology for code stroke and had another episode of clinical seizure, characterized by left gaze deviation, unresponsiveness and sustained full body low-amplitude jerking movements.  It lasted approximately 5 minutes in duration and terminated following administration of 2 mg of IV Ativan .  CT head and CT angio head and neck without acute finding.  Neurology recommended loading dose of IV  Keppra  4.5 g once, then 1 g twice daily, MRI brain and LTM EEG.     Patient was transferred to [facility] for LTM EEG.  MRI brain showed signal abnormality and restricted diffusion within the right hippocampus favoring postictal change versus acute infarct, and large remote right MCA territory infarct.  LTM EEG showed evidence of epileptogenicity and cortical dysfunction arising from the right hemisphere but no active seizure.  Neurology recommended continuing Keppra  1 g twice daily.  Therapy recommended CIR but CIR recommended SNF.  However, patient declined SNF and chose to go home with home health.  He was discharged with home health PT/OT.  Outpatient follow-up with PCP and neurology as above.  See individual problem list below for more.   Problems addressed during this hospitalization Breakthrough seizures in the setting of seizure disorder-reports compliance with home Keppra .  Symptoms, imaging and LTM EEG as above.  Was on Keppra  500 mg twice daily at home. -Neurology recommended Keppra  1 g twice daily. -Outpatient follow-up with neurology in 2 to 3 weeks. -Seizure precaution including not driving for 6 months   Worsening left-sided hemiparesis, POA: MRI brain without acute CVA.  Weakness likely recrudescence of symptoms of prior CVA.  Acute CVA ruled out. -Continue Pradaxa , aspirin  and Lipitor. -Home health PT/OT ordered. -Outpatient follow-up with neurology in 2 to 3 weeks   Visual hallucinations: Had an episode on 1/31.  No further episode. -Ok to continue Keppra  per neurology.   BPH/ Urinary Retention: Due to missed doses of Flomax ? -Passed voiding trial  on 2/2. -Continue home Flomax    Hypokalemia: Resolved   History of DVT on Pradaxa  -Continue home Pradaxa .   CKD-3A: Relatively stable. - Recheck renal function in 1 week   Hypothyroidism -Continue home Synthroid    GERD -Continue PPI   Prediabetes: A1c 6.0% (was 5.9% in 06/2024)   Class II obesity Body mass index is 38.66  kg/m.           Consultations: Neurology  Time spent 35  minutes  Vital signs Vitals:   01/03/25 0052 01/03/25 0501 01/03/25 0536 01/03/25 0802  BP: 118/65 115/71  124/66  Pulse: (!) 55 (!) 58  (!) 57  Temp: 98.4 F (36.9 C) 98.6 F (37 C)  98.4 F (36.9 C)  Resp: 18 18  17   Height:      Weight:   129.3 kg   SpO2: 100% 99%  97%  TempSrc: Oral Oral    BMI (Calculated):   38.65      Discharge exam  GENERAL: No apparent distress.  Nontoxic. HEENT: MMM.  Vision and hearing grossly intact.  NECK: Supple.  No apparent JVD.  RESP:  No IWOB.  Fair aeration bilaterally. CVS:  RRR. Heart sounds normal.  ABD/GI/GU: BS+. Abd soft, NTND.  MSK/EXT: Left-sided weakness, 3/5 in LUE and LLE.  BLE swelling and stasis dermatitis. SKIN: Stasis dermatitis in BLE. NEURO: AA.  Oriented appropriately.  No apparent focal neuro deficit other than known left weakness as above PSYCH: Calm. Normal affect.   Discharge Instructions Discharge Instructions     Discharge instructions   Complete by: As directed    It has been a pleasure taking care of you!  You were hospitalized due to possible breakthrough seizure.  We have increased your Keppra .  Please take your medications as prescribed.  Follow-up with your primary care doctor in 1 to 2 weeks or sooner if needed.  Follow-up with primary neurologist in 2 to 3 weeks.  Per [place]  DMV statutes, patients with seizures are not allowed to drive until  they have been seizure-free for six months. Use caution when using heavy equipment or power tools. Avoid working on ladders or at heights. Take showers instead of baths. Ensure the water  temperature is not too high on the home water  heater. Do not go swimming alone. When caring for infants or small children, sit down when holding, feeding, or changing them to minimize risk of injury to the child in the event you have a seizure.  To reduce risk of seizures, maintain good sleep hygiene avoid  alcohol and illicit drug use, take all anti-seizure medications as prescribed.     Take care,   Increase activity slowly   Complete by: As directed    Increase activity slowly   Complete by: As directed       Allergies as of 01/03/2025   No Known Allergies      Medication List     STOP taking these medications    amLODipine  5 MG tablet Commonly known as: NORVASC    lisinopril  2.5 MG tablet Commonly known as: ZESTRIL        TAKE these medications    acetaminophen  325 MG tablet Commonly known as: TYLENOL  Take 2 tablets (650 mg total) by mouth every 6 (six) hours as needed for mild pain (pain score 1-3), fever or headache.   aspirin  EC 81 MG tablet Take 1 tablet (81 mg total) by mouth daily. Swallow whole.   atorvastatin  40 MG tablet Commonly known as: LIPITOR Take 1  tablet (40 mg total) by mouth daily.   candesartan 4 MG tablet Commonly known as: ATACAND Take 4 mg by mouth daily.   levETIRAcetam  1000 MG tablet Commonly known as: KEPPRA  Take 1 tablet (1,000 mg total) by mouth 2 (two) times daily. What changed:  medication strength how much to take   levothyroxine  50 MCG tablet Commonly known as: SYNTHROID  Take 1 tablet (50 mcg total) by mouth daily before breakfast.   melatonin 3 MG Tabs tablet Take 2 tablets (6 mg total) by mouth at bedtime as needed.   multivitamin tablet Take 1 tablet by mouth daily.   pantoprazole  40 MG tablet Commonly known as: PROTONIX  Take 1 tablet (40 mg total) by mouth daily.   Pradaxa  150 MG Caps capsule Generic drug: dabigatran  Take 1 capsule (150 mg total) by mouth every 12 (twelve) hours.   senna-docusate 8.6-50 MG tablet Commonly known as: Senokot-S Take 1 tablet by mouth at bedtime.   tamsulosin  0.4 MG Caps capsule Commonly known as: FLOMAX  Take 1 capsule (0.4 mg total) by mouth daily after supper. What changed: See the new instructions.         Procedures/Studies:   Overnight EEG with video Result  Date: 12/30/2024 Shelton Arlin KIDD, MD     12/30/2024  5:02 PM Patient Name: Matthew Wise MRN: [id number] Epilepsy Attending: Arlin KIDD Shelton Referring Physician/Provider: Shelton Arlin KIDD, MD Duration: 12/29/2024 0950 to 12/30/2024 0950  Patient history:  64 year old male with history of prior stroke and subsequent seizures presented with breakthrough seizure, no clear etiology. EEG to evaluate for seizure  Level of alertness: Awake, asleep  AEDs during EEG study: LEV  Technical aspects: This EEG study was done with scalp electrodes positioned according to the 10-20 International system of electrode placement. Electrical activity was reviewed with band pass filter of 1-70Hz , sensitivity of 7 uV/mm, display speed of 47mm/sec with a 60Hz  notched filter applied as appropriate. EEG data were recorded continuously and digitally stored.  Video monitoring was available and reviewed as appropriate.  Description: The posterior dominant rhythm consists of 9 Hz activity of moderate voltage (25-35 uV) seen predominantly in posterior head regions, symmetric and reactive to eye opening and eye closing. Sleep was characterized by vertex waves, sleep spindles (12 to 14 Hz), maximal frontocentral region. EEG showed continuous 3 to 6 Hz theta-delta slowing in right hemisphere. Lateralized periodic discharges were noted in right hemisphere at 0.5-1hz  which gradually resolved. Hyperventilation and photic stimulation were not performed.    ABNORMALITY - Lateralized periodic discharges, right hemisphere - Continuous slow, right hemisphere  IMPRESSION: This study showed evidence of epileptogenicity and cortical dysfunction arising from right hemisphere likely secondary to underlying structural abnormality. No seizures were seen throughout the recording.  Arlin KIDD Shelton   EEG adult Result Date: 12/28/2024 Shelton Arlin KIDD, MD     12/28/2024  5:12 PM Patient Name: Matthew Wise MRN: [id number] Epilepsy Attending: Arlin KIDD Shelton  Referring Physician/Provider: Shona Terry SAILOR, DO Date: 12/28/2024 Duration: 22.25 mins Patient history:  64 year old male with history of prior stroke and subsequent seizures presented with breakthrough seizure, no clear etiology. EEG to evaluate for seizure Level of alertness: Awake AEDs during EEG study: LEV Technical aspects: This EEG study was done with scalp electrodes positioned according to the 10-20 International system of electrode placement. Electrical activity was reviewed with band pass filter of 1-70Hz , sensitivity of 7 uV/mm, display speed of 24mm/sec with a 60Hz  notched filter applied as appropriate. EEG data were recorded continuously  and digitally stored.  Video monitoring was available and reviewed as appropriate. Description: The posterior dominant rhythm consists of 9 Hz activity of moderate voltage (25-35 uV) seen predominantly in posterior head regions, symmetric and reactive to eye opening and eye closing. EEG showed continuous 3 to 6 Hz theta-delta slowing in right hemisphere. Lateralized periodic discharges were noted in right hemisphere at 0.5-1hz . Hyperventilation and photic stimulation were not performed.   This study was technically difficult due to significant myogenic artifact ABNORMALITY - Lateralized periodic discharges, right hemisphere - Continuous slow, right hemisphere IMPRESSION: This technically difficult study showed evidence of epileptogenicity and cortical dysfunction arising from right hemisphere likely secondary to underlying structural abnormality and increased risk of seizure recurrence. No seizures were seen throughout the recording. Arlin MALVA Krebs   ECHOCARDIOGRAM COMPLETE Result Date: 12/28/2024    ECHOCARDIOGRAM REPORT   Patient Name:   Matthew Wise Date of Exam: 12/28/2024 Medical Rec #:  [id number]       Height:       72.0 in Accession #:    [id number]      Weight:       282.4 lb Date of Birth:  January 14, 1961      BSA:          2.467 m Patient Age:    63 years         BP:           152/91 mmHg Patient Gender: M               HR:           63 bpm. Exam Location:  [facility] Procedure: 2D Echo, Cardiac Doppler and Color Doppler (Both Spectral and Color            Flow Doppler were utilized during procedure). Indications:    Stroke  History:        Patient has prior history of Echocardiogram examinations, most                 recent 01/22/2021.  Sonographer:    Odella Brewster Referring Phys: [id number] TERRY LOISE HURST  Sonographer Comments: Image acquisition challenging due to respiratory motion. IMPRESSIONS  1. Left ventricular ejection fraction, by estimation, is 60 to 65%. The left ventricle has normal function. The left ventricle has no regional wall motion abnormalities. The left ventricular internal cavity size was mildly dilated. There is mild concentric left ventricular hypertrophy. Left ventricular diastolic parameters are consistent with Grade I diastolic dysfunction (impaired relaxation).  2. Right ventricular systolic function is normal. The right ventricular size is normal. Tricuspid regurgitation signal is inadequate for assessing PA pressure.  3. The mitral valve is grossly normal. Trivial mitral valve regurgitation.  4. The aortic valve is tricuspid. Aortic valve regurgitation is trivial. No aortic stenosis is present. Aortic valve mean gradient measures 3.0 mmHg.  5. The inferior vena cava is normal in size with greater than 50% respiratory variability, suggesting right atrial pressure of 3 mmHg. Comparison(s): Prior images reviewed side by side. LVEF 60-65% without regional wall motion abnormalities. Grade 1 diastolic dysfunction. FINDINGS  Left Ventricle: Left ventricular ejection fraction, by estimation, is 60 to 65%. The left ventricle has normal function. The left ventricle has no regional wall motion abnormalities. The left ventricular internal cavity size was mildly dilated. There is  mild concentric left ventricular hypertrophy. Left ventricular diastolic  parameters are consistent with Grade I diastolic dysfunction (impaired relaxation). Right Ventricle: The right ventricular size is normal.  No increase in right ventricular wall thickness. Right ventricular systolic function is normal. Tricuspid regurgitation signal is inadequate for assessing PA pressure. Left Atrium: Left atrial size was normal in size. Right Atrium: Right atrial size was normal in size. Pericardium: Trivial pericardial effusion is present. The pericardial effusion is posterior to the left ventricle. Presence of epicardial fat layer. Mitral Valve: The mitral valve is grossly normal. Trivial mitral valve regurgitation. MV peak gradient, 4.0 mmHg. The mean mitral valve gradient is 2.0 mmHg. Tricuspid Valve: The tricuspid valve is grossly normal. Tricuspid valve regurgitation is trivial. Aortic Valve: The aortic valve is tricuspid. Aortic valve regurgitation is trivial. No aortic stenosis is present. Aortic valve mean gradient measures 3.0 mmHg. Aortic valve peak gradient measures 6.7 mmHg. Aortic valve area, by VTI measures 3.31 cm. Pulmonic Valve: The pulmonic valve was grossly normal. Pulmonic valve regurgitation is trivial. Aorta: The aortic root is normal in size and structure. Venous: The inferior vena cava is normal in size with greater than 50% respiratory variability, suggesting right atrial pressure of 3 mmHg. IAS/Shunts: No atrial level shunt detected by color flow Doppler. Additional Comments: 3D was performed not requiring image post processing on an independent workstation and was indeterminate.  LEFT VENTRICLE PLAX 2D LVIDd:         5.80 cm      Diastology LVIDs:         3.70 cm      LV e' medial:    5.55 cm/s LV PW:         1.50 cm      LV E/e' medial:  10.6 LV IVS:        1.10 cm      LV e' lateral:   5.11 cm/s LVOT diam:     2.30 cm      LV E/e' lateral: 11.5 LV SV:         86 LV SV Index:   35 LVOT Area:     4.15 cm LV IVRT:       98 msec  LV Volumes (MOD) LV vol d, MOD A2C: 180.0  ml LV vol d, MOD A4C: 157.0 ml LV vol s, MOD A2C: 64.7 ml LV vol s, MOD A4C: 63.5 ml LV SV MOD A2C:     115.3 ml LV SV MOD A4C:     157.0 ml LV SV MOD BP:      104.8 ml RIGHT VENTRICLE RV S prime:     10.60 cm/s  PULMONARY VEINS                             Diastolic Velocity: 31.30 cm/s                             S/D Velocity:       1.50                             Systolic Velocity:  48.40 cm/s LEFT ATRIUM             Index LA diam:        4.10 cm 1.66 cm/m LA Vol (A2C):   55.8 ml 22.62 ml/m LA Vol (A4C):   33.1 ml 13.42 ml/m LA Biplane Vol: 46.2 ml 18.73 ml/m  AORTIC VALVE  PULMONIC VALVE AV Area (Vmax):    2.96 cm     PV Vmax:       1.15 m/s AV Area (Vmean):   3.52 cm     PV Peak grad:  5.3 mmHg AV Area (VTI):     3.31 cm AV Vmax:           129.00 cm/s AV Vmean:          82.200 cm/s AV VTI:            0.260 m AV Peak Grad:      6.7 mmHg AV Mean Grad:      3.0 mmHg LVOT Vmax:         91.80 cm/s LVOT Vmean:        69.600 cm/s LVOT VTI:          0.207 m LVOT/AV VTI ratio: 0.80  AORTA Ao Root diam: 3.30 cm MITRAL VALVE MV Area (PHT): 2.72 cm    SHUNTS MV Area VTI:   3.32 cm    Systemic VTI:  0.21 m MV Peak grad:  4.0 mmHg    Systemic Diam: 2.30 cm MV Mean grad:  2.0 mmHg MV Vmax:       1.00 m/s MV Vmean:      61.7 cm/s MV Decel Time: 279 msec MV E velocity: 58.70 cm/s MV A velocity: 78.10 cm/s MV E/A ratio:  0.75 Jayson Sierras MD Electronically signed by Jayson Sierras MD Signature Date/Time: 12/28/2024/12:47:13 PM    Final    MR BRAIN W WO CONTRAST Result Date: 12/28/2024 EXAM: MRI BRAIN WITH AND WITHOUT CONTRAST 12/28/2024 10:25:50 AM TECHNIQUE: Multiplanar multisequence MRI of the head/brain was performed with and without the administration of intravenous contrast. COMPARISON: CT head and CTA head and neck 11/06/2025. CLINICAL HISTORY: Seizure disorder, clinical change. FINDINGS: BRAIN AND VENTRICLES: No acute intracranial hemorrhage. No mass effect or midline shift. No  hydrocephalus. Normal flow voids. No abnormal intracranial enhancement. Encephalomalacia throughout the right MCA territory with surrounding gliosis compatible with remote infarct susceptibility suggestive of remote petechial hemorrhage. There is diffusion signal abnormality and restricted diffusion within the right hippocampus along with mild FLAIR hyperintensity which could reflect post changes versus infarct. Remote infarcts in the bilateral cerebellum remote infarcts in the dorsal aspect of the pons. T2 and FLAIR hyperintensity throughout the periventricular and subcortical white matter compatible with chronic microvascular ischemic changes there are areas of FLAIR signal abnormality in the right cerebral peduncle with associated volume loss suggestive of wallerian degeneration mild ex vacuo dilatation of the right lateral ventricle. ORBITS: No significant abnormality. SINUSES: Mucosal thickening in the ethmoid sinuses mucous retention cyst in the right maxillary sinus. BONES AND SOFT TISSUES: Normal bone marrow signal. No soft tissue abnormality. IMPRESSION: 1. Signal abnormality and restricted diffusion within the right hippocampus, favor postictal changes. Recommend correlation with EEG findings. Acute infarct is an additional consideration. 2. Large remote right MCA territory infarct. Small remote infarcts in the pons and cerebellum. Electronically signed by: Donnice Mania MD 12/28/2024 10:54 AM EST RP Workstation: [id number]   CT HEAD CODE STROKE WO CONTRAST (LKW 0-4.5h, LVO 0-24h) Result Date: 12/27/2024 CLINICAL DATA:  Initial evaluation for acute neuro deficit, stroke suspected, left-sided immobility. EXAM: CT HEAD WITHOUT CONTRAST CT ANGIOGRAPHY HEAD AND NECK TECHNIQUE: Multidetector CT imaging of the head and neck was performed using the standard protocol during bolus administration of intravenous contrast. Multiplanar CT image reconstructions and MIPs were obtained to evaluate the vascular anatomy.  Carotid stenosis  measurements (when applicable) are obtained utilizing NASCET criteria, using the distal internal carotid diameter as the denominator. RADIATION DOSE REDUCTION: This exam was performed according to the departmental dose-optimization program which includes automated exposure control, adjustment of the mA and/or kV according to patient size and/or use of iterative reconstruction technique. CONTRAST:  OMNIPAQUE  IOHEXOL  350 MG/ML SOLN COMPARISON:  Comparison made with prior MRI from 02/23/2022. FINDINGS: CT HEAD FINDINGS Brain: Cerebral volume within normal limits. Changes of chronic microvascular ischemic disease noted about the supratentorial cerebral white matter. Extensive encephalomalacia within the right cerebral hemisphere, consistent with a large remote right MCA distribution infarct. No acute intracranial hemorrhage. No acute large vessel territory infarct. No mass lesion or midline shift. Mild ex vacuo dilatation of the right lateral ventricle without hydrocephalus. No extra-axial fluid collection. Vascular: No abnormal hyperdense vessel. Skull: Scalp soft tissues within normal limits.  Calvarium intact. Sinuses/Orbits: Left gaze preference noted. Mild scattered mucosal thickening about the ethmoidal air cells. Right maxillary sinus retention cyst. No significant mastoid effusion. Other: Aspects equals 10. Review of the MIP images confirms the above findings CTA NECK FINDINGS Aortic arch: Visualized aortic arch within normal limits for caliber with standard 3 vessel morphology. Mild aortic atherosclerosis. No stenosis about the origin the great vessels. Right carotid system: Right common and internal carotid arteries are patent without dissection. Mild atheromatous change about the right carotid bulb without stenosis. Left carotid system: Left common and internal carotid arteries are patent without dissection. Mild atheromatous change about the left carotid bulb without stenosis.  Vertebral arteries: Both vertebral arteries arise from subclavian arteries. Vertebral arteries are diminutive bilaterally but patent without stenosis or dissection. Skeleton: No worrisome osseous lesions. Other neck: A shin no other acute finding. Ovoid cystic lesion measuring 3.0 x 2.5 cm at the right submandibular region, likely a sebaceous cyst (series 6, image 73). Few small thyroid  nodules noted, largest of which measures 1.8 cm on the right. These have been previously evaluated by thyroid  ultrasound on 01/26/2021. Upper chest: No other acute finding. Review of the MIP images confirms the above findings CTA HEAD FINDINGS Anterior circulation: Mild atheromatous change about the carotid siphons without hemodynamically significant stenosis. A1 segments patent bilaterally. Normal anterior greater artery complex. Anterior cerebral arteries patent without stenosis. No M1 stenosis or occlusion. No proximal MCA branch occlusion. Right MCA branches are attenuated as compared to the left, in keeping with the chronic right MCA distribution infarct. Left MCA branches well perfused. Posterior circulation: Diminutive V4 segments are patent without stenosis. Left PICA patent. Right PICA origin not well seen. Basilar markedly diminutive but patent without stenosis. Superior cerebral arteries patent bilaterally. Fetal type origin of the PCAs bilaterally. Both PCAs patent to their distal aspects without significant stenosis. Venous sinuses: Patent allowing for timing the contrast bolus. Anatomic variants: Fetal type origin of the PCAs with overall diminutive vertebrobasilar system. No aneurysm. Review of the MIP images confirms the above findings IMPRESSION: CT HEAD: 1. No acute intracranial abnormality. 2. Aspects equals 10. 3. Large remote right MCA distribution infarct. 4. Underlying chronic microvascular ischemic disease. CTA HEAD AND NECK: 1. Negative CTA for large vessel occlusion or other emergent finding. 2. Mild  atheromatous change about the carotid bifurcations and carotid siphons without hemodynamically significant stenosis. 3. Fetal type origin of the PCAs with overall diminutive vertebrobasilar system. 4.  Aortic Atherosclerosis (ICD10-I70.0). Results communicated by telephone at the time of interpretation on 12/27/2024 at 11:29 pm to provider [facility]. Electronically Signed   By: Morene  Nicholes M.D.   On: 12/27/2024 23:43   CT ANGIO HEAD NECK W WO CM (CODE STROKE) Result Date: 12/27/2024 CLINICAL DATA:  Initial evaluation for acute neuro deficit, stroke suspected, left-sided immobility. EXAM: CT HEAD WITHOUT CONTRAST CT ANGIOGRAPHY HEAD AND NECK TECHNIQUE: Multidetector CT imaging of the head and neck was performed using the standard protocol during bolus administration of intravenous contrast. Multiplanar CT image reconstructions and MIPs were obtained to evaluate the vascular anatomy. Carotid stenosis measurements (when applicable) are obtained utilizing NASCET criteria, using the distal internal carotid diameter as the denominator. RADIATION DOSE REDUCTION: This exam was performed according to the departmental dose-optimization program which includes automated exposure control, adjustment of the mA and/or kV according to patient size and/or use of iterative reconstruction technique. CONTRAST:  OMNIPAQUE  IOHEXOL  350 MG/ML SOLN COMPARISON:  Comparison made with prior MRI from 02/23/2022. FINDINGS: CT HEAD FINDINGS Brain: Cerebral volume within normal limits. Changes of chronic microvascular ischemic disease noted about the supratentorial cerebral white matter. Extensive encephalomalacia within the right cerebral hemisphere, consistent with a large remote right MCA distribution infarct. No acute intracranial hemorrhage. No acute large vessel territory infarct. No mass lesion or midline shift. Mild ex vacuo dilatation of the right lateral ventricle without hydrocephalus. No extra-axial fluid  collection. Vascular: No abnormal hyperdense vessel. Skull: Scalp soft tissues within normal limits.  Calvarium intact. Sinuses/Orbits: Left gaze preference noted. Mild scattered mucosal thickening about the ethmoidal air cells. Right maxillary sinus retention cyst. No significant mastoid effusion. Other: Aspects equals 10. Review of the MIP images confirms the above findings CTA NECK FINDINGS Aortic arch: Visualized aortic arch within normal limits for caliber with standard 3 vessel morphology. Mild aortic atherosclerosis. No stenosis about the origin the great vessels. Right carotid system: Right common and internal carotid arteries are patent without dissection. Mild atheromatous change about the right carotid bulb without stenosis. Left carotid system: Left common and internal carotid arteries are patent without dissection. Mild atheromatous change about the left carotid bulb without stenosis. Vertebral arteries: Both vertebral arteries arise from subclavian arteries. Vertebral arteries are diminutive bilaterally but patent without stenosis or dissection. Skeleton: No worrisome osseous lesions. Other neck: A shin no other acute finding. Ovoid cystic lesion measuring 3.0 x 2.5 cm at the right submandibular region, likely a sebaceous cyst (series 6, image 73). Few small thyroid  nodules noted, largest of which measures 1.8 cm on the right. These have been previously evaluated by thyroid  ultrasound on 01/26/2021. Upper chest: No other acute finding. Review of the MIP images confirms the above findings CTA HEAD FINDINGS Anterior circulation: Mild atheromatous change about the carotid siphons without hemodynamically significant stenosis. A1 segments patent bilaterally. Normal anterior greater artery complex. Anterior cerebral arteries patent without stenosis. No M1 stenosis or occlusion. No proximal MCA branch occlusion. Right MCA branches are attenuated as compared to the left, in keeping with the chronic right MCA  distribution infarct. Left MCA branches well perfused. Posterior circulation: Diminutive V4 segments are patent without stenosis. Left PICA patent. Right PICA origin not well seen. Basilar markedly diminutive but patent without stenosis. Superior cerebral arteries patent bilaterally. Fetal type origin of the PCAs bilaterally. Both PCAs patent to their distal aspects without significant stenosis. Venous sinuses: Patent allowing for timing the contrast bolus. Anatomic variants: Fetal type origin of the PCAs with overall diminutive vertebrobasilar system. No aneurysm. Review of the MIP images confirms the above findings IMPRESSION: CT HEAD: 1. No acute intracranial abnormality. 2. Aspects equals 10. 3. Large remote right  MCA distribution infarct. 4. Underlying chronic microvascular ischemic disease. CTA HEAD AND NECK: 1. Negative CTA for large vessel occlusion or other emergent finding. 2. Mild atheromatous change about the carotid bifurcations and carotid siphons without hemodynamically significant stenosis. 3. Fetal type origin of the PCAs with overall diminutive vertebrobasilar system. 4.  Aortic Atherosclerosis (ICD10-I70.0). Results communicated by telephone at the time of interpretation on 12/27/2024 at 11:29 pm to provider [facility]. Electronically Signed   By: Morene Hoard M.D.   On: 12/27/2024 23:43       The results of significant diagnostics from this hospitalization (including imaging, microbiology, ancillary and laboratory) are listed below for reference.     Microbiology: Recent Results (from the past 240 hours)  MRSA Next Gen by PCR, Nasal     Status: None   Collection Time: 12/28/24  3:14 AM   Specimen: Nasal Mucosa; Nasal Swab  Result Value Ref Range Status   MRSA by PCR Next Gen NOT DETECTED NOT DETECTED Final    Comment: (NOTE) The GeneXpert MRSA Assay (FDA approved for NASAL specimens only), is one component of a comprehensive MRSA colonization surveillance program. It  is not intended to diagnose MRSA infection nor to guide or monitor treatment for MRSA infections. Test performance is not FDA approved in patients less than 38 years old. Performed at [facility], [address] [place], [place] [address]      Labs:  CBC: Recent Labs  Lab 12/27/24 2303 12/27/24 2307 12/31/24 0945  WBC 6.2  --  6.8  NEUTROABS 3.3  --   --   HGB 13.4 14.3 12.9*  HCT 40.7 42.0 37.1*  MCV 83.1  --  80.7  PLT 179  --  183   BMP &GFR Recent Labs  Lab 12/27/24 2303 12/27/24 2307 12/28/24 0700 12/31/24 0945 01/01/25 0250  NA 141 142 142 139 138  K 3.4* 3.4* 3.6 3.3* 3.8  CL 104 103 102 101 101  CO2 23  --  28 27 29   GLUCOSE 142* 142* 132* 110* 98  BUN 9 8 9 14 18   CREATININE 1.33* 1.50* 1.37* 1.41* 1.54*  CALCIUM  8.9  --  9.0 8.8* 8.9  MG  --   --  2.0 1.9 1.9  PHOS  --   --   --   --  2.8   Estimated Creatinine Clearance (by C-G formula based on SCr of 1.54 mg/dL (H)) Male: 42.7 mL/min (A) Male: 69.2 mL/min (A) Liver & Pancreas: Recent Labs  Lab 12/27/24 2303 12/31/24 0945 01/01/25 0250  AST 23 16  --   ALT 14 9  --   ALKPHOS 151* 107  --   BILITOT 0.5 0.8  --   PROT 7.9 6.7  --   ALBUMIN 4.2 3.4* 3.5   No results for input(s): LIPASE, AMYLASE in the last 168 hours. No results for input(s): AMMONIA in the last 168 hours. Diabetic: No results for input(s): HGBA1C in the last 72 hours. Recent Labs  Lab 12/27/24 2302 12/30/24 0604 12/30/24 1229 12/30/24 1635  GLUCAP 138* 118* 103* 113*   Cardiac Enzymes: Recent Labs  Lab 12/31/24 0945  CKTOTAL 191   No results for input(s): PROBNP in the last 8760 hours. Coagulation Profile: Recent Labs  Lab 12/27/24 2303  INR 1.3*   Thyroid  Function Tests: No results for input(s): TSH, T4TOTAL, FREET4, T3FREE, THYROIDAB in the last 72 hours. Lipid Profile: No results for input(s): CHOL, HDL, LDLCALC, TRIG, CHOLHDL, LDLDIRECT in the last 72 hours.  Anemia  Panel: No results for input(s): VITAMINB12, FOLATE, FERRITIN, TIBC, IRON, RETICCTPCT in the last 72 hours. Urine analysis:    Component Value Date/Time   COLORURINE STRAW (A) 12/28/2024 0030   APPEARANCEUR CLEAR 12/28/2024 0030   LABSPEC 1.020 12/28/2024 0030   PHURINE 6.0 12/28/2024 0030   GLUCOSEU NEGATIVE 12/28/2024 0030   HGBUR NEGATIVE 12/28/2024 0030   BILIRUBINUR NEGATIVE 12/28/2024 0030   KETONESUR NEGATIVE 12/28/2024 0030   PROTEINUR NEGATIVE 12/28/2024 0030   UROBILINOGEN >8.0 (H) 02/03/2015 1854   NITRITE NEGATIVE 12/28/2024 0030   LEUKOCYTESUR NEGATIVE 12/28/2024 0030   Sepsis Labs: Invalid input(s): PROCALCITONIN, LACTICIDVEN   SIGNED:  Kwamaine Cuppett T Pinchus Weckwerth, MD  [facility] 01/03/2025, 1:09 PM   "

## 2025-01-04 ENCOUNTER — Telehealth: Payer: Self-pay | Admitting: *Deleted

## 2025-01-04 NOTE — Transitions of Care (Post Inpatient/ED Visit) (Signed)
" ° °  01/04/2025  Name: DIAMONTE STAVELY MRN: [id number] DOB: [birth year]  Today's TOC FU Call Status: Today's TOC FU Call Status:: Unsuccessful Call (1st Attempt) Unsuccessful Call (1st Attempt) Date: 01/04/25  Attempted to reach the patient regarding the most recent Inpatient/ED visit.  Follow Up Plan: Additional outreach attempts will be made to reach the patient to complete the Transitions of Care (Post Inpatient/ED visit) call.   Andrea Dimes RN, BSN [place]  [facility] RN Care Manager [phone]  "

## 2025-01-05 ENCOUNTER — Telehealth: Payer: Self-pay | Admitting: Internal Medicine

## 2025-01-05 ENCOUNTER — Other Ambulatory Visit: Payer: Self-pay | Admitting: Internal Medicine

## 2025-01-05 DIAGNOSIS — G8110 Spastic hemiplegia affecting unspecified side: Secondary | ICD-10-CM

## 2025-01-05 NOTE — Telephone Encounter (Signed)
 Copied from CRM [id number]. Topic: Referral - Request for Referral >> Jan 04, 2025  4:12 PM Tobias L wrote: Did the patient discuss referral with their provider in the last year? No  Appointment offered? No, has upcoming appt on 01/17/25  Type of order/referral and detailed reason for visit: Physical Therapy and Occupational Therapy - Home Health services.   [facility] received referral from [facility] but patient's insurance [facility]) requires referral to come from PCP. Jasmine with [facility] calling to inform office. If any questions arise can be reached at [phone]  Preference of office, provider, location: [facility]  Fax: [phone]  If referral order, have you been seen by this specialty before? Unsure   Can we respond through MyChart? No

## 2025-01-17 ENCOUNTER — Ambulatory Visit: Admitting: Internal Medicine

## 2025-03-27 ENCOUNTER — Ambulatory Visit: Admitting: Adult Health

## 2025-12-28 ENCOUNTER — Ambulatory Visit: Payer: Self-pay
